# Patient Record
Sex: Female | Born: 1950 | Race: White | Hispanic: No | Smoking: Former smoker
Health system: Southern US, Community
[De-identification: ages and names within clinical notes are randomized; demographics above are authoritative.]

## PROBLEM LIST (undated history)

## (undated) DIAGNOSIS — M545 Low back pain, unspecified: Secondary | ICD-10-CM

## (undated) DIAGNOSIS — D3502 Benign neoplasm of left adrenal gland: Secondary | ICD-10-CM

## (undated) DIAGNOSIS — I519 Heart disease, unspecified: Secondary | ICD-10-CM

## (undated) DIAGNOSIS — J189 Pneumonia, unspecified organism: Secondary | ICD-10-CM

## (undated) DIAGNOSIS — F329 Major depressive disorder, single episode, unspecified: Secondary | ICD-10-CM

## (undated) DIAGNOSIS — M1990 Unspecified osteoarthritis, unspecified site: Secondary | ICD-10-CM

## (undated) DIAGNOSIS — R918 Other nonspecific abnormal finding of lung field: Secondary | ICD-10-CM

## (undated) DIAGNOSIS — I8500 Esophageal varices without bleeding: Secondary | ICD-10-CM

## (undated) DIAGNOSIS — E119 Type 2 diabetes mellitus without complications: Secondary | ICD-10-CM

## (undated) DIAGNOSIS — R0600 Dyspnea, unspecified: Secondary | ICD-10-CM

## (undated) DIAGNOSIS — F410 Panic disorder [episodic paroxysmal anxiety] without agoraphobia: Secondary | ICD-10-CM

## (undated) DIAGNOSIS — I10 Essential (primary) hypertension: Secondary | ICD-10-CM

## (undated) DIAGNOSIS — Z87442 Personal history of urinary calculi: Secondary | ICD-10-CM

## (undated) DIAGNOSIS — E785 Hyperlipidemia, unspecified: Secondary | ICD-10-CM

## (undated) DIAGNOSIS — J449 Chronic obstructive pulmonary disease, unspecified: Secondary | ICD-10-CM

## (undated) DIAGNOSIS — F419 Anxiety disorder, unspecified: Secondary | ICD-10-CM

## (undated) DIAGNOSIS — C4490 Unspecified malignant neoplasm of skin, unspecified: Secondary | ICD-10-CM

## (undated) DIAGNOSIS — I509 Heart failure, unspecified: Secondary | ICD-10-CM

## (undated) DIAGNOSIS — F32A Depression, unspecified: Secondary | ICD-10-CM

## (undated) DIAGNOSIS — I5189 Other ill-defined heart diseases: Secondary | ICD-10-CM

## (undated) HISTORY — DX: Major depressive disorder, single episode, unspecified: F32.9

## (undated) HISTORY — PX: SKIN CANCER EXCISION: SHX779

## (undated) HISTORY — DX: Chronic obstructive pulmonary disease, unspecified: J44.9

## (undated) HISTORY — DX: Type 2 diabetes mellitus without complications: E11.9

## (undated) HISTORY — PX: OTHER SURGICAL HISTORY: SHX169

## (undated) HISTORY — PX: SPINE SURGERY: SHX786

## (undated) HISTORY — DX: Depression, unspecified: F32.A

## (undated) HISTORY — DX: Hyperlipidemia, unspecified: E78.5

## (undated) HISTORY — DX: Essential (primary) hypertension: I10

## (undated) HISTORY — DX: Heart failure, unspecified: I50.9

## (undated) MED FILL — Iron Sucrose Inj 20 MG/ML (Fe Equiv): INTRAVENOUS | Qty: 15 | Status: AC

---
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
## 1997-07-10 ENCOUNTER — Encounter: Admission: RE | Admit: 1997-07-10 | Discharge: 1997-07-10 | Payer: Self-pay | Admitting: Family Medicine

## 1997-11-07 ENCOUNTER — Encounter: Admission: RE | Admit: 1997-11-07 | Discharge: 1997-11-07 | Payer: Self-pay | Admitting: Family Medicine

## 1997-11-11 ENCOUNTER — Encounter: Admission: RE | Admit: 1997-11-11 | Discharge: 1997-11-11 | Payer: Self-pay | Admitting: Sports Medicine

## 1997-11-28 ENCOUNTER — Encounter: Admission: RE | Admit: 1997-11-28 | Discharge: 1997-11-28 | Payer: Self-pay | Admitting: Family Medicine

## 2000-05-02 ENCOUNTER — Encounter: Admission: RE | Admit: 2000-05-02 | Discharge: 2000-05-02 | Payer: Self-pay | Admitting: Sports Medicine

## 2000-05-11 ENCOUNTER — Encounter: Admission: RE | Admit: 2000-05-11 | Discharge: 2000-05-11 | Payer: Self-pay | Admitting: Sports Medicine

## 2000-05-11 ENCOUNTER — Encounter: Payer: Self-pay | Admitting: Sports Medicine

## 2000-05-16 ENCOUNTER — Encounter: Payer: Self-pay | Admitting: Sports Medicine

## 2000-05-16 ENCOUNTER — Encounter: Admission: RE | Admit: 2000-05-16 | Discharge: 2000-05-16 | Payer: Self-pay | Admitting: Sports Medicine

## 2000-11-30 ENCOUNTER — Encounter: Admission: RE | Admit: 2000-11-30 | Discharge: 2000-11-30 | Payer: Self-pay | Admitting: Family Medicine

## 2001-03-14 ENCOUNTER — Encounter: Admission: RE | Admit: 2001-03-14 | Discharge: 2001-03-14 | Payer: Self-pay | Admitting: Family Medicine

## 2001-05-02 ENCOUNTER — Encounter: Payer: Self-pay | Admitting: Family Medicine

## 2001-05-02 ENCOUNTER — Encounter: Admission: RE | Admit: 2001-05-02 | Discharge: 2001-05-02 | Payer: Self-pay | Admitting: Family Medicine

## 2001-05-04 ENCOUNTER — Encounter: Admission: RE | Admit: 2001-05-04 | Discharge: 2001-05-04 | Payer: Self-pay | Admitting: Family Medicine

## 2001-05-11 ENCOUNTER — Encounter: Admission: RE | Admit: 2001-05-11 | Discharge: 2001-05-11 | Payer: Self-pay | Admitting: Family Medicine

## 2001-05-30 ENCOUNTER — Encounter: Admission: RE | Admit: 2001-05-30 | Discharge: 2001-07-02 | Payer: Self-pay | Admitting: Infectious Diseases

## 2001-07-09 ENCOUNTER — Encounter: Admission: RE | Admit: 2001-07-09 | Discharge: 2001-07-09 | Payer: Self-pay | Admitting: Family Medicine

## 2001-12-06 ENCOUNTER — Encounter: Admission: RE | Admit: 2001-12-06 | Discharge: 2001-12-06 | Payer: Self-pay | Admitting: Family Medicine

## 2002-02-07 ENCOUNTER — Encounter: Admission: RE | Admit: 2002-02-07 | Discharge: 2002-02-07 | Payer: Self-pay | Admitting: Family Medicine

## 2002-02-14 ENCOUNTER — Encounter: Admission: RE | Admit: 2002-02-14 | Discharge: 2002-02-14 | Payer: Self-pay | Admitting: Sports Medicine

## 2002-02-14 ENCOUNTER — Encounter: Payer: Self-pay | Admitting: Sports Medicine

## 2002-04-04 ENCOUNTER — Encounter: Admission: RE | Admit: 2002-04-04 | Discharge: 2002-04-04 | Payer: Self-pay | Admitting: Sports Medicine

## 2002-05-14 ENCOUNTER — Ambulatory Visit (HOSPITAL_BASED_OUTPATIENT_CLINIC_OR_DEPARTMENT_OTHER): Admission: RE | Admit: 2002-05-14 | Discharge: 2002-05-14 | Payer: Self-pay | Admitting: General Surgery

## 2002-05-28 ENCOUNTER — Encounter: Admission: RE | Admit: 2002-05-28 | Discharge: 2002-05-28 | Payer: Self-pay | Admitting: Family Medicine

## 2002-07-11 ENCOUNTER — Encounter: Admission: RE | Admit: 2002-07-11 | Discharge: 2002-07-11 | Payer: Self-pay | Admitting: Family Medicine

## 2002-07-18 ENCOUNTER — Encounter: Admission: RE | Admit: 2002-07-18 | Discharge: 2002-07-18 | Payer: Self-pay | Admitting: Family Medicine

## 2002-08-06 ENCOUNTER — Encounter: Admission: RE | Admit: 2002-08-06 | Discharge: 2002-08-06 | Payer: Self-pay | Admitting: Sports Medicine

## 2003-04-04 ENCOUNTER — Encounter: Admission: RE | Admit: 2003-04-04 | Discharge: 2003-04-04 | Payer: Self-pay | Admitting: Family Medicine

## 2003-04-11 ENCOUNTER — Encounter: Admission: RE | Admit: 2003-04-11 | Discharge: 2003-04-11 | Payer: Self-pay | Admitting: Family Medicine

## 2003-09-10 ENCOUNTER — Encounter: Admission: RE | Admit: 2003-09-10 | Discharge: 2003-09-10 | Payer: Self-pay | Admitting: Orthopaedic Surgery

## 2003-11-19 ENCOUNTER — Encounter: Admission: RE | Admit: 2003-11-19 | Discharge: 2003-11-19 | Payer: Self-pay | Admitting: Family Medicine

## 2003-12-03 ENCOUNTER — Inpatient Hospital Stay (HOSPITAL_COMMUNITY): Admission: RE | Admit: 2003-12-03 | Discharge: 2003-12-06 | Payer: Self-pay | Admitting: Orthopaedic Surgery

## 2004-07-26 ENCOUNTER — Ambulatory Visit: Payer: Self-pay | Admitting: Family Medicine

## 2005-03-28 ENCOUNTER — Encounter (INDEPENDENT_AMBULATORY_CARE_PROVIDER_SITE_OTHER): Payer: Self-pay | Admitting: *Deleted

## 2005-04-12 ENCOUNTER — Ambulatory Visit: Payer: Self-pay | Admitting: Family Medicine

## 2005-04-12 ENCOUNTER — Encounter: Admission: RE | Admit: 2005-04-12 | Discharge: 2005-04-12 | Payer: Self-pay | Admitting: Sports Medicine

## 2005-05-11 ENCOUNTER — Ambulatory Visit: Payer: Self-pay | Admitting: Family Medicine

## 2005-09-13 ENCOUNTER — Ambulatory Visit: Payer: Self-pay | Admitting: Sports Medicine

## 2006-01-03 ENCOUNTER — Ambulatory Visit: Payer: Self-pay | Admitting: Family Medicine

## 2006-04-12 ENCOUNTER — Ambulatory Visit: Payer: Self-pay | Admitting: Sports Medicine

## 2006-04-24 ENCOUNTER — Ambulatory Visit: Payer: Self-pay | Admitting: Family Medicine

## 2006-05-11 ENCOUNTER — Ambulatory Visit: Payer: Self-pay | Admitting: Sports Medicine

## 2006-05-25 DIAGNOSIS — E669 Obesity, unspecified: Secondary | ICD-10-CM | POA: Insufficient documentation

## 2006-05-25 DIAGNOSIS — F339 Major depressive disorder, recurrent, unspecified: Secondary | ICD-10-CM | POA: Insufficient documentation

## 2006-05-25 DIAGNOSIS — I10 Essential (primary) hypertension: Secondary | ICD-10-CM | POA: Insufficient documentation

## 2006-05-25 DIAGNOSIS — F17200 Nicotine dependence, unspecified, uncomplicated: Secondary | ICD-10-CM | POA: Insufficient documentation

## 2006-05-25 DIAGNOSIS — M1990 Unspecified osteoarthritis, unspecified site: Secondary | ICD-10-CM | POA: Insufficient documentation

## 2006-05-26 ENCOUNTER — Encounter (INDEPENDENT_AMBULATORY_CARE_PROVIDER_SITE_OTHER): Payer: Self-pay | Admitting: *Deleted

## 2006-10-13 ENCOUNTER — Ambulatory Visit: Payer: Self-pay | Admitting: Family Medicine

## 2007-02-14 ENCOUNTER — Ambulatory Visit: Payer: Self-pay | Admitting: Family Medicine

## 2007-02-14 DIAGNOSIS — J449 Chronic obstructive pulmonary disease, unspecified: Secondary | ICD-10-CM | POA: Insufficient documentation

## 2007-02-27 ENCOUNTER — Ambulatory Visit: Payer: Self-pay | Admitting: Family Medicine

## 2007-03-01 ENCOUNTER — Ambulatory Visit: Payer: Self-pay | Admitting: Family Medicine

## 2007-03-06 ENCOUNTER — Ambulatory Visit: Payer: Self-pay | Admitting: Family Medicine

## 2007-03-08 ENCOUNTER — Encounter: Payer: Self-pay | Admitting: Family Medicine

## 2007-03-13 ENCOUNTER — Encounter: Payer: Self-pay | Admitting: Family Medicine

## 2007-05-10 ENCOUNTER — Ambulatory Visit: Payer: Self-pay | Admitting: Family Medicine

## 2007-05-25 ENCOUNTER — Encounter: Payer: Self-pay | Admitting: *Deleted

## 2007-07-16 ENCOUNTER — Telehealth: Payer: Self-pay | Admitting: *Deleted

## 2007-07-17 ENCOUNTER — Ambulatory Visit: Payer: Self-pay | Admitting: Family Medicine

## 2007-07-17 DIAGNOSIS — R609 Edema, unspecified: Secondary | ICD-10-CM | POA: Insufficient documentation

## 2007-07-20 ENCOUNTER — Telehealth: Payer: Self-pay | Admitting: *Deleted

## 2007-07-23 ENCOUNTER — Encounter: Payer: Self-pay | Admitting: Family Medicine

## 2007-07-23 ENCOUNTER — Ambulatory Visit: Payer: Self-pay | Admitting: Family Medicine

## 2007-07-23 ENCOUNTER — Telehealth: Payer: Self-pay | Admitting: *Deleted

## 2007-07-23 LAB — CONVERTED CEMR LAB
ALT: 60 units/L — ABNORMAL HIGH (ref 0–35)
Alkaline Phosphatase: 117 units/L (ref 39–117)
Chloride: 98 meq/L (ref 96–112)
Creatinine, Ser: 0.75 mg/dL (ref 0.40–1.20)
Glucose, Bld: 125 mg/dL — ABNORMAL HIGH (ref 70–99)
MCV: 94.8 fL (ref 78.0–100.0)
Platelets: 187 10*3/uL (ref 150–400)
RDW: 14.2 % (ref 11.5–15.5)
Specific Gravity, Urine: 1.015
WBC: 8.2 10*3/uL (ref 4.0–10.5)

## 2007-07-25 ENCOUNTER — Encounter: Payer: Self-pay | Admitting: Family Medicine

## 2007-07-26 ENCOUNTER — Telehealth: Payer: Self-pay | Admitting: Family Medicine

## 2007-07-26 ENCOUNTER — Ambulatory Visit: Payer: Self-pay | Admitting: Internal Medicine

## 2007-07-26 ENCOUNTER — Encounter (INDEPENDENT_AMBULATORY_CARE_PROVIDER_SITE_OTHER): Payer: Self-pay | Admitting: Family Medicine

## 2007-07-26 ENCOUNTER — Ambulatory Visit (HOSPITAL_COMMUNITY): Admission: RE | Admit: 2007-07-26 | Discharge: 2007-07-26 | Payer: Self-pay | Admitting: Family Medicine

## 2007-07-31 ENCOUNTER — Ambulatory Visit: Payer: Self-pay | Admitting: Cardiovascular Disease

## 2007-07-31 LAB — CONVERTED CEMR LAB
Calcium: 9 mg/dL (ref 8.4–10.5)
Creatinine, Ser: 0.8 mg/dL (ref 0.4–1.2)
GFR calc non Af Amer: 79 mL/min
Glucose, Bld: 167 mg/dL — ABNORMAL HIGH (ref 70–99)
Potassium: 3 meq/L — ABNORMAL LOW (ref 3.5–5.1)
Sodium: 139 meq/L (ref 135–145)

## 2007-08-03 ENCOUNTER — Telehealth (INDEPENDENT_AMBULATORY_CARE_PROVIDER_SITE_OTHER): Payer: Self-pay | Admitting: Family Medicine

## 2007-08-06 ENCOUNTER — Ambulatory Visit: Payer: Self-pay | Admitting: Pulmonary Disease

## 2007-08-08 ENCOUNTER — Ambulatory Visit: Payer: Self-pay | Admitting: Internal Medicine

## 2007-08-13 ENCOUNTER — Encounter: Payer: Self-pay | Admitting: *Deleted

## 2007-08-29 ENCOUNTER — Ambulatory Visit: Payer: Self-pay | Admitting: Pulmonary Disease

## 2007-08-29 DIAGNOSIS — R911 Solitary pulmonary nodule: Secondary | ICD-10-CM | POA: Insufficient documentation

## 2007-09-17 ENCOUNTER — Encounter: Payer: Self-pay | Admitting: Family Medicine

## 2007-09-17 ENCOUNTER — Ambulatory Visit: Payer: Self-pay | Admitting: Sports Medicine

## 2007-09-17 LAB — CONVERTED CEMR LAB
AST: 65 units/L — ABNORMAL HIGH (ref 0–37)
Albumin: 3.6 g/dL (ref 3.5–5.2)
CO2: 30 meq/L (ref 19–32)
Calcium: 9 mg/dL (ref 8.4–10.5)
Chloride: 100 meq/L (ref 96–112)
Creatinine, Ser: 0.69 mg/dL (ref 0.40–1.20)
Glucose, Bld: 113 mg/dL — ABNORMAL HIGH (ref 70–99)
Potassium: 3.7 meq/L (ref 3.5–5.3)
Sodium: 141 meq/L (ref 135–145)
Total Bilirubin: 0.5 mg/dL (ref 0.3–1.2)
Total Protein: 7.7 g/dL (ref 6.0–8.3)

## 2007-09-24 ENCOUNTER — Ambulatory Visit: Payer: Self-pay | Admitting: Cardiovascular Disease

## 2007-09-24 LAB — CONVERTED CEMR LAB
GFR calc non Af Amer: 91 mL/min
Glucose, Bld: 110 mg/dL — ABNORMAL HIGH (ref 70–99)
Hgb A1c MFr Bld: 7.5 % — ABNORMAL HIGH (ref 4.6–6.0)
Potassium: 3.3 meq/L — ABNORMAL LOW (ref 3.5–5.1)

## 2007-10-01 ENCOUNTER — Telehealth: Payer: Self-pay | Admitting: *Deleted

## 2007-10-08 ENCOUNTER — Ambulatory Visit: Payer: Self-pay | Admitting: Family Medicine

## 2007-10-08 DIAGNOSIS — D239 Other benign neoplasm of skin, unspecified: Secondary | ICD-10-CM | POA: Insufficient documentation

## 2007-10-10 ENCOUNTER — Encounter: Payer: Self-pay | Admitting: Family Medicine

## 2007-10-15 ENCOUNTER — Telehealth: Payer: Self-pay | Admitting: *Deleted

## 2007-12-21 ENCOUNTER — Encounter: Payer: Self-pay | Admitting: Family Medicine

## 2008-01-04 ENCOUNTER — Ambulatory Visit: Payer: Self-pay | Admitting: Family Medicine

## 2008-01-16 ENCOUNTER — Encounter: Payer: Self-pay | Admitting: Family Medicine

## 2008-02-19 ENCOUNTER — Encounter: Payer: Self-pay | Admitting: Pulmonary Disease

## 2008-02-26 ENCOUNTER — Ambulatory Visit: Payer: Self-pay | Admitting: Cardiovascular Disease

## 2008-04-07 ENCOUNTER — Telehealth: Payer: Self-pay | Admitting: Family Medicine

## 2008-04-11 ENCOUNTER — Telehealth: Payer: Self-pay | Admitting: *Deleted

## 2008-04-11 ENCOUNTER — Encounter: Payer: Self-pay | Admitting: Family Medicine

## 2008-04-11 ENCOUNTER — Ambulatory Visit: Payer: Self-pay | Admitting: Family Medicine

## 2008-04-11 LAB — CONVERTED CEMR LAB
ALT: 58 units/L — ABNORMAL HIGH (ref 0–35)
AST: 68 units/L — ABNORMAL HIGH (ref 0–37)
Alkaline Phosphatase: 116 units/L (ref 39–117)
BUN: 15 mg/dL (ref 6–23)
Calcium: 9.2 mg/dL (ref 8.4–10.5)
Creatinine, Ser: 0.79 mg/dL (ref 0.40–1.20)
Glucose, Bld: 154 mg/dL — ABNORMAL HIGH (ref 70–99)
MCHC: 32.9 g/dL (ref 30.0–36.0)
Total Bilirubin: 0.4 mg/dL (ref 0.3–1.2)
WBC: 9.5 10*3/uL (ref 4.0–10.5)

## 2008-08-12 ENCOUNTER — Ambulatory Visit: Payer: Self-pay | Admitting: Family Medicine

## 2008-08-12 DIAGNOSIS — E119 Type 2 diabetes mellitus without complications: Secondary | ICD-10-CM

## 2008-08-12 HISTORY — DX: Type 2 diabetes mellitus without complications: E11.9

## 2008-08-15 ENCOUNTER — Telehealth: Payer: Self-pay | Admitting: Family Medicine

## 2008-08-18 ENCOUNTER — Ambulatory Visit: Payer: Self-pay | Admitting: Family Medicine

## 2008-08-18 ENCOUNTER — Encounter: Payer: Self-pay | Admitting: Family Medicine

## 2008-08-18 LAB — CONVERTED CEMR LAB
HDL: 37 mg/dL — ABNORMAL LOW (ref >39)
LDL Cholesterol: 125 mg/dL — ABNORMAL HIGH (ref 0–99)
Triglycerides: 88 mg/dL (ref <150)

## 2008-08-20 ENCOUNTER — Encounter: Payer: Self-pay | Admitting: Family Medicine

## 2008-08-28 ENCOUNTER — Ambulatory Visit: Payer: Self-pay | Admitting: Family Medicine

## 2008-08-29 ENCOUNTER — Telehealth: Payer: Self-pay | Admitting: *Deleted

## 2008-09-17 ENCOUNTER — Encounter: Payer: Self-pay | Admitting: Pulmonary Disease

## 2008-09-22 ENCOUNTER — Ambulatory Visit: Payer: Self-pay | Admitting: Cardiology

## 2008-09-24 ENCOUNTER — Telehealth: Payer: Self-pay | Admitting: Family Medicine

## 2008-09-28 ENCOUNTER — Emergency Department (HOSPITAL_COMMUNITY): Admission: EM | Admit: 2008-09-28 | Discharge: 2008-09-28 | Payer: Self-pay | Admitting: Emergency Medicine

## 2008-10-13 ENCOUNTER — Ambulatory Visit: Payer: Self-pay | Admitting: Family Medicine

## 2009-02-23 ENCOUNTER — Telehealth: Payer: Self-pay | Admitting: Family Medicine

## 2009-03-24 ENCOUNTER — Ambulatory Visit: Payer: Self-pay | Admitting: Family Medicine

## 2009-03-24 ENCOUNTER — Encounter: Payer: Self-pay | Admitting: Family Medicine

## 2009-03-24 DIAGNOSIS — E785 Hyperlipidemia, unspecified: Secondary | ICD-10-CM

## 2009-03-24 DIAGNOSIS — E1169 Type 2 diabetes mellitus with other specified complication: Secondary | ICD-10-CM | POA: Insufficient documentation

## 2009-03-25 LAB — CONVERTED CEMR LAB
ALT: 11 units/L (ref 0–35)
AST: 15 units/L (ref 0–37)
Albumin: 3.8 g/dL (ref 3.5–5.2)
Alkaline Phosphatase: 110 units/L (ref 39–117)
BUN: 21 mg/dL (ref 6–23)
CO2: 26 meq/L (ref 19–32)
Calcium: 9.2 mg/dL (ref 8.4–10.5)
Chloride: 104 meq/L (ref 96–112)
Cholesterol: 201 mg/dL — ABNORMAL HIGH (ref 0–200)
Creatinine, Ser: 0.64 mg/dL (ref 0.40–1.20)
HDL: 42 mg/dL (ref >39)
Potassium: 3.9 meq/L (ref 3.5–5.3)
Sodium: 140 meq/L (ref 135–145)
Total Bilirubin: 0.3 mg/dL (ref 0.3–1.2)
Total Protein: 7.4 g/dL (ref 6.0–8.3)
Triglycerides: 98 mg/dL (ref <150)
VLDL: 20 mg/dL (ref 0–40)

## 2009-04-07 ENCOUNTER — Encounter: Payer: Self-pay | Admitting: Family Medicine

## 2009-04-07 ENCOUNTER — Ambulatory Visit: Payer: Self-pay | Admitting: Family Medicine

## 2009-04-07 DIAGNOSIS — M722 Plantar fascial fibromatosis: Secondary | ICD-10-CM | POA: Insufficient documentation

## 2009-04-08 LAB — CONVERTED CEMR LAB: Creatinine, Ser: 0.6 mg/dL (ref 0.40–1.20)

## 2009-06-11 ENCOUNTER — Ambulatory Visit: Payer: Self-pay | Admitting: Family Medicine

## 2009-06-11 ENCOUNTER — Encounter: Payer: Self-pay | Admitting: Family Medicine

## 2009-06-12 LAB — CONVERTED CEMR LAB
ALT: 12 units/L (ref 0–35)
Alkaline Phosphatase: 113 units/L (ref 39–117)
Calcium: 8.6 mg/dL (ref 8.4–10.5)
Total Bilirubin: 0.4 mg/dL (ref 0.3–1.2)

## 2009-06-16 ENCOUNTER — Ambulatory Visit: Payer: Self-pay | Admitting: Family Medicine

## 2009-08-07 ENCOUNTER — Encounter: Payer: Self-pay | Admitting: Pulmonary Disease

## 2009-08-11 ENCOUNTER — Ambulatory Visit: Payer: Self-pay | Admitting: Family Medicine

## 2009-08-12 ENCOUNTER — Ambulatory Visit: Payer: Self-pay | Admitting: Cardiology

## 2009-08-28 ENCOUNTER — Telehealth: Payer: Self-pay | Admitting: Family Medicine

## 2009-10-09 ENCOUNTER — Telehealth: Payer: Self-pay | Admitting: Family Medicine

## 2009-10-09 ENCOUNTER — Encounter: Admission: RE | Admit: 2009-10-09 | Discharge: 2009-10-09 | Payer: Self-pay | Admitting: Family Medicine

## 2009-10-09 ENCOUNTER — Ambulatory Visit: Payer: Self-pay | Admitting: Family Medicine

## 2009-10-09 DIAGNOSIS — S93409A Sprain of unspecified ligament of unspecified ankle, initial encounter: Secondary | ICD-10-CM | POA: Insufficient documentation

## 2010-02-08 ENCOUNTER — Ambulatory Visit: Payer: Self-pay | Admitting: Family Medicine

## 2010-02-08 ENCOUNTER — Telehealth: Payer: Self-pay | Admitting: *Deleted

## 2010-04-27 NOTE — Progress Notes (Signed)
Summary: pt thinks she has pn   Phone Note Call from Patient   Caller: Patient Call For: triage Summary of Call: pt thinks that she has pneumonia has had signs symptoms since friday she is out of breath Initial call taken by: Audelia Hives CMA,  February 08, 2010 8:33 AM  Follow-up for Phone Call        Cough abd SOB over the weekend.  Cough productive and blood tinged.  She can be here in about 30 minutes, gave her a [place] appt. Follow-up by: Elray Mcgregor RN,  February 08, 2010 8:53 AM

## 2010-04-27 NOTE — Assessment & Plan Note (Signed)
Summary: foot pain,df   Vital Signs:  Patient profile:   60 year old female Height:      62 inches Weight:      235.7 pounds BMI:     43.27 Temp:     98.0 degrees F oral Pulse rate:   73 / minute BP sitting:   138 / 78  (left arm) Cuff size:   regular  Vitals Entered By: Levert Feinstein LPN (June 11, 3792 3:27 AM) CC: right foot pain, Is Patient Diabetic? No Pain Assessment Patient in pain? yes     Location: right foot Intensity: 8   Primary Care Provider:  [name]  MD  CC:  right foot pain and .  History of Present Illness: 1) Right heel pain: Seen by me in January 2011 for right heel pain x 7 weeks. Pain located sole of foot, anterior to calcaneus. Worse with weight bearing, direct palpation, though improves somewhat with walking. Somewhat relieved by ibuprofen. Diagnosed with plantar fasciitis - trial of NSAID, RICE therapy, exercises, but no improvement in overall pain. Denies swelling, skin change, loss of sensation, similar pain elsewhere.   2) HTN: On Lasix, lisinopril w/o side effects. Denies chest pain, dyspnea. Reports improvement in LE edema.   3) Hyperlipidemia: Started statin last visit. LDL 139 (goal 70), HDL 42. Continued dietary modification with increased fruits and vegetables, decreased fried food.  6) Tobacco abuse: Actively cutting back per our plan. Now down to 5 per day. Had quit about a decade ago but restarted with death of husband.   7) Prevention: Declines colonoscopy, mammogram. Pap wnl. Flu shot at last visit.    Habits & Providers  Alcohol-Tobacco-Diet     Tobacco Status: current  Current Medications (verified): 1)  Celexa 40 Mg Tabs (Citalopram Hydrobromide) .... Take 1 Tablet By Mouth Once A Day 2)  Atrovent Hfa 17 Mcg/act  Aers (Ipratropium Bromide Hfa) .... Inhale 2 Puffs Every 4 To 6 Hours As Needed 3)  Metformin Hcl 500 Mg Tabs (Metformin Hcl) .Marland Kitchen.. 1 Tab By Mouth Bid 4)  Glucometer Dex High Control  Liqd (Blood Glucose Calibration)  .... Test Cbgs Once Daily. 5)  Glucometer Dex Test Sensors  Strp (Glucose Blood) .... Use As Directed. 6)  Lisinopril 10 Mg Tabs (Lisinopril) .... One Tab By Mouth Qday 7)  Lovastatin 20 Mg Tabs (Lovastatin) .... One Tab By Mouth Qday 8)  Lasix 20 Mg Tabs (Furosemide) .... Two Tabs By Mouth Daily  Allergies (verified): No Known Drug Allergies  Physical Exam  General:  Obese, pleasant female in no acute distress Lungs:  Normal respiratory effort, chest expands symmetrically. Lungs are clear to auscultation, no crackles or wheezes. Heart:  regular rate and rhythm, no murmurs  Msk:  Pain at insertion of plantar fascia of right foot with palpation. Patient with some loss of bilateral longitudinal arches  Neurologic:  alert & oriented X3 and cranial nerves II-XII intact.    Diabetes Management Exam:    Foot Exam (with socks and/or shoes not present):       Sensory-Pinprick/Light touch:          Left medial foot (L-4): normal          Left dorsal foot (L-5): normal          Left lateral foot (S-1): normal          Right medial foot (L-4): normal          Right dorsal foot (L-5): normal  Right lateral foot (S-1): normal       Sensory-Monofilament:          Left foot: normal          Right foot: normal       Inspection:          Left foot: normal          Right foot: normal       Nails:          Left foot: normal          Right foot: normal   Impression & Recommendations:  Problem # 1:  PLANTAR FASCIITIS, RIGHT (ICD-728.71) Assessment Unchanged  Will refer to sports medicine for further evaluation likely w/ ultrasound. Continue RICE therapy; will switch to Tylenol given HTN.   Orders: [place]- Est  Level 4 ([address]62)  Problem # 2:  HYPERTENSION, BENIGN SYSTEMIC (ICD-401.1) Assessment: Unchanged  CMET today. Follow up in 1 month. Continue meds as below. Advised salt restriction, increase activity.  Her updated medication list for this problem includes:    Lisinopril 10 Mg  Tabs (Lisinopril) ..... One tab by mouth qday    Lasix 20 Mg Tabs (Furosemide) .Marland Kitchen..Marland Kitchen Two tabs by mouth daily  BP today: 138/78 Prior BP: 154/82 (04/07/2009)  10 Yr Risk Heart Disease: 27 % Prior 10 Yr Risk Heart Disease: Not enough information (01/04/2008)  Labs Reviewed: K+: 4.0 (04/07/2009) Creat: : 0.60 (04/07/2009)   Chol: 201 (03/24/2009)   HDL: 42 (03/24/2009)   LDL: 139 (03/24/2009)   TG: 98 (03/24/2009)  Orders: [place]- Est  Level 4 ([address]214)  Problem # 3:  HYPERLIPIDEMIA (ICD-272.4) Assessment: Unchanged Continue statin as below. Recheck in one month. CMET today. Diet and exercise reviewed.  Her updated medication list for this problem includes:    Lovastatin 20 Mg Tabs (Lovastatin) ..... One tab by mouth qday  Orders: [facility] ([id number]) [place]- Est  Level 4 ([address]009)  Complete Medication List: 1)  Celexa 40 Mg Tabs (Citalopram hydrobromide) .... Take 1 tablet by mouth once a day 2)  Atrovent Hfa 17 Mcg/act Aers (Ipratropium bromide hfa) .... Inhale 2 puffs every 4 to 6 hours as needed 3)  Metformin Hcl 500 Mg Tabs (Metformin hcl) .Marland Kitchen.. 1 tab by mouth bid 4)  Glucometer Dex High Control Liqd (Blood glucose calibration) .... Test cbgs once daily. 5)  Glucometer Dex Test Sensors Strp (Glucose blood) .... Use as directed. 6)  Lisinopril 10 Mg Tabs (Lisinopril) .... One tab by mouth qday 7)  Lovastatin 20 Mg Tabs (Lovastatin) .... One tab by mouth qday 8)  Lasix 20 Mg Tabs (Furosemide) .... Two tabs by mouth daily  Patient Instructions: 1)  Go to Sports Medicine for your foot pain. 2)  Stop taking ibuprofen, start taking Tylenol for pain.  3)  Continue to do your exercises for your foot as tolerated  4)  We will check blood work today. 5)  Follow up in one month to discuss blood pressure and diabetes.  6)  Continue all other medicines as before.   Flex Sig Next Due:  Refused Colonoscopy Next Due:  Refused Last Hemoccult Result: Done. (05/27/2002 12:00:00 AM) Hemoccult  Next Due:  Refused Last Mammogram:  Done. (01/26/2002 12:00:00 AM) Mammogram Next Due:  Refused Last Diabetes Foot Check:  yes (03/24/2009 10:37:39 AM) Diabetic Foot Exam Date:  06/11/2009 Diabetes Foot Check Result:  normal Foot Care Education Date:  06/11/2009 Foot Care Education:  completed Last Creatinine:  0.60 (04/07/2009 7:32:00 PM) Creatinine  Next Due: 6 mo Last Potassium:  4.0 (04/07/2009 7:32:00 PM) Potassium Next Due:  6 mo    Prevention & Chronic Care Immunizations   Influenza vaccine: Fluvax 3+  (03/24/2009)   Influenza vaccine due: 11/26/2009    Tetanus booster: 05/27/2002: Done.   Tetanus booster due: 05/26/2012    Pneumococcal vaccine: Not documented  Colorectal Screening   Hemoccult: Done.  (05/27/2002)   Hemoccult action/deferral: Refused  (03/24/2009)   Hemoccult due: Refused  (06/11/2009)    Colonoscopy: Not documented   Colonoscopy action/deferral: Refused  (03/24/2009)   Colonoscopy due: Refused  (06/11/2009)  Other Screening   Pap smear: NEGATIVE FOR INTRAEPITHELIAL LESIONS OR MALIGNANCY.  (03/24/2009)   Pap smear action/deferral: Ordered  (03/24/2009)   Pap smear due: 03/24/2010    Mammogram: Done.  (01/26/2002)   Mammogram action/deferral: Refused  (03/24/2009)   Mammogram due: Refused  (06/11/2009)   Smoking status: current  (06/11/2009)   Smoking cessation counseling: yes  (03/01/2007)   Target quit date: 04/24/2009  (03/24/2009)  Diabetes Mellitus   HgbA1C: 5.9  (03/24/2009)   HgbA1C action/deferral: Ordered  (03/24/2009)   Hemoglobin A1C due: 06/22/2009    Eye exam: Not documented   Diabetic eye exam action/deferral: Deferred  (03/24/2009)    Foot exam: normal  (06/11/2009)   Foot exam action/deferral: Do today   High risk foot: Not documented   Foot care education: completed  (06/11/2009)   Foot exam due: 06/12/2010    Urine microalbumin/creatinine ratio: Not documented   Urine microalbumin action/deferral: Deferred     Diabetes flowsheet reviewed?: Yes   Progress toward A1C goal: At goal  Lipids   Total Cholesterol: 201  (03/24/2009)   Lipid panel action/deferral: Lipid Panel ordered   LDL: 139  (03/24/2009)   LDL Direct: Not documented   HDL: 42  (03/24/2009)   Triglycerides: 98  (03/24/2009)   Lipid panel due: 06/22/2009    SGOT (AST): 15  (03/24/2009)   BMP action: Ordered   SGPT (ALT): 11  (03/24/2009) CMP ordered    Alkaline phosphatase: 110  (03/24/2009)   Total bilirubin: 0.3  (03/24/2009)   Liver panel due: 09/22/2009    Lipid flowsheet reviewed?: Yes   Progress toward LDL goal: Unchanged  Hypertension   Last Blood Pressure: 138 / 78  (06/11/2009)   Serum creatinine: 0.60  (04/07/2009)   BMP action: Ordered   Serum potassium 4.0  (04/07/2009) CMP ordered    Basic metabolic panel due: 02/40/9735    Hypertension flowsheet reviewed?: Yes   Progress toward BP goal: Improved  Self-Management Support :   Personal Goals (by the next clinic visit) :     Personal A1C goal: 7  (03/24/2009)     Personal blood pressure goal: 130/80  (03/24/2009)     Personal LDL goal: 70  (03/24/2009)    Patient will work on the following items until the next clinic visit to reach self-care goals:     Medications and monitoring: take my medicines every day, check my blood sugar, check my blood pressure, bring all of my medications to every visit  (06/11/2009)     Eating: drink diet soda or water instead of juice or soda, eat more vegetables, eat foods that are low in salt, eat baked foods instead of fried foods, eat fruit for snacks and desserts  (06/11/2009)     Activity: take a 30 minute walk every day  (06/11/2009)     Home glucose monitoring frequency: 1 time daily  (03/24/2009)  Diabetes self-management support: Not documented    Diabetes self-management support not done because: Good outcomes  (03/24/2009)    Hypertension self-management support: BP self-monitoring log, Written self-care plan,  Education handout  (04/07/2009)    Hypertension self-management support not done because: Good outcomes  (06/11/2009)    Lipid self-management support: Written self-care plan, Education handout  (06/11/2009)   Lipid self-care plan printed.   Lipid education handout printed

## 2010-04-27 NOTE — Assessment & Plan Note (Signed)
Summary: f/u visit/bmc   Vital Signs:  Patient profile:   60 year old female Height:      62 inches Weight:      239 pounds BMI:     43.87 BSA:     2.06 Temp:     98.3 degrees F Pulse rate:   77 / minute BP sitting:   154 / 82  Vitals Entered By: Christen Bame CMA (April 07, 2009 2:31 PM) CC: f/u HTN  Is Patient Diabetic? Yes Pain Assessment Patient in pain? yes     Location: right heel Intensity: 6   Primary Care Provider:  [name]  MD  CC:  f/u HTN .  History of Present Illness: 1) HTN: Stopped her Lasix, HCTZ at last visit per our discussion. Has noted more LE edema, feels like retaining fluid. Denies chest pain, dyspnea, HA, neurological symptoms. Started on ACE-I - denies side effects.   2) DM2: Last A1C 6.9. Not on ACE-I. On metformin. No further episodes of hypoglycemia. Denies vision change, polyuria, polydipsia. Has not called ophthalmologist yet.   3) Right heel pain: Has had right heel pain x 7 weeks. Pain is at sole of foot at front of heel when weight bearing, but improves with walking. Intermittent. Denies swelling, skin change,  loss of sensation, similar pain elsewhere. Has not improved. Notes that she tends to sit at her computer where she works with her toes in forced dorsiflexion.   4) Obesity: Has lost a total of 50 lbs through dietary modification, met with Dr. Jenne Campus.  (increased vegetables, whole grains; also increased activity (was walking up to 5 miles a day prior to back surgery - now walking 1/2 mile per day - working back up). Has regained 4 lbs since last visit (feels like fluid pr patient)   5) Hyperlipidemia: HDL 42, LDL 137  ~ 6 months ago. Has lost weight as above, increased activity, reduced fried foods to zero, increased fruits and vegetables.   6) Tobacco abuse: Actively cutting back per our plan. Now down to 5 per day. Had quit about a decade ago but restarted with death of husband.   7) Prevention: Declines colonoscopy, mammogram.  Pap wnl. Flu shot at last visit.   Habits & Providers  Alcohol-Tobacco-Diet     Tobacco Status: current     Tobacco Counseling: to quit use of tobacco products     Cigarette Packs/Day: 0.25  Current Medications (verified): 1)  Celexa 40 Mg Tabs (Citalopram Hydrobromide) .... Take 1 Tablet By Mouth Once A Day 2)  Atrovent Hfa 17 Mcg/act  Aers (Ipratropium Bromide Hfa) .... Inhale 2 Puffs Every 4 To 6 Hours As Needed 3)  Metformin Hcl 500 Mg Tabs (Metformin Hcl) .Marland Kitchen.. 1 Tab By Mouth Bid 4)  Glucometer Dex High Control  Liqd (Blood Glucose Calibration) .... Test Cbgs Once Daily. 5)  Glucometer Dex Test Sensors  Strp (Glucose Blood) .... Use As Directed. 6)  Lisinopril 10 Mg Tabs (Lisinopril) .... One Tab By Mouth Qday 7)  Lovastatin 20 Mg Tabs (Lovastatin) .... One Tab By Mouth Qday 8)  Lasix 20 Mg Tabs (Furosemide) .... Two Tabs By Mouth Daily  Allergies (verified): No Known Drug Allergies  Social History: Packs/Day:  0.25  Physical Exam  General:  Obese, pleasant female in no acute distress Eyes:  vision grossly intact, pupils equal, pupils round, pupils reactive to light, pupils react to accomodation, corneas and lenses clear, no injection, no optic disk abnormalities, no retinal abnormalitiies, and normal  cup-disc ratio.   Lungs:  Normal respiratory effort, chest expands symmetrically. Lungs are clear to auscultation, no crackles or wheezes. Heart:  regular rate and rhythm, no murmurs  Msk:  Pain at insertion of plantar fascia of right foot with palpation (with toes dorsiflexed)  Pulses:  2+ radials and pedals  Extremities:  1+ LE edema  Neurologic:  alert & oriented X3 and cranial nerves II-XII intact.     Impression & Recommendations:  Problem # 1:  HYPERTENSION, BENIGN SYSTEMIC (ICD-401.1) Assessment Deteriorated  Likely secondary to stopping LAsix as was controlled before. Restart Lasix. Check Cr today (started ACE-I). Follow up in 3 months. Continue ACE-I  Her updated  medication list for this problem includes:    Lisinopril 10 Mg Tabs (Lisinopril) ..... One tab by mouth qday    Lasix 20 Mg Tabs (Furosemide) .Marland Kitchen..Marland Kitchen Two tabs by mouth daily  Orders: [facility] ([id number]) [facility]- Est  Level 4 (99214)  BP today: 154/82 Prior BP: 130/77 (03/24/2009)  Prior 10 Yr Risk Heart Disease: Not enough information (01/04/2008)  Labs Reviewed: K+: 3.9 (03/24/2009) Creat: : 0.64 (03/24/2009)   Chol: 201 (03/24/2009)   HDL: 42 (03/24/2009)   LDL: 139 (03/24/2009)   TG: 98 (03/24/2009)  Problem # 2:  TOBACCO DEPENDENCE (ICD-305.1) Assessment: Unchanged  On schedule per instructions. Continues to actively cut back.    Orders: [place]- Est  Level 4 (86754)  Problem # 3:  HYPERLIPIDEMIA (GBE-010.4) Assessment: Unchanged  Will start statin. Recheck LFTs 6 weeks.  Her updated medication list for this problem includes:    Lovastatin 20 Mg Tabs (Lovastatin) ..... One tab by mouth qday  Orders: [place]- Est  Level 4 ([address]121)  Problem # 4:  OBESITY, NOS (ICD-278.00) Assessment: Deteriorated Increased weight likely secondary to fluid retetntion. Restart LAsix. Continue exercise and dietary modification.   Problem # 5:  PLANTAR FASCIITIS, RIGHT (ICD-728.71) Assessment: Unchanged  Likely given history, presentation and exam. Given exercises. RICE therapy. Follow up in three months. Tylenol for pain as needed .   Orders: [place]- Est  Level 4 ([address]214)  Complete Medication List: 1)  Celexa 40 Mg Tabs (Citalopram hydrobromide) .... Take 1 tablet by mouth once a day 2)  Atrovent Hfa 17 Mcg/act Aers (Ipratropium bromide hfa) .... Inhale 2 puffs every 4 to 6 hours as needed 3)  Metformin Hcl 500 Mg Tabs (Metformin hcl) .Marland Kitchen.. 1 tab by mouth bid 4)  Glucometer Dex High Control Liqd (Blood glucose calibration) .... Test cbgs once daily. 5)  Glucometer Dex Test Sensors Strp (Glucose blood) .... Use as directed. 6)  Lisinopril 10 Mg Tabs (Lisinopril) .... One tab by mouth qday 7)   Lovastatin 20 Mg Tabs (Lovastatin) .... One tab by mouth qday 8)  Lasix 20 Mg Tabs (Furosemide) .... Two tabs by mouth daily  Patient Instructions: 1)  It was great to see you today!  2)  Restart your LASIX  3)  START taking LOVASTATIN daily  4)  Stop taking your Klor-con (potassium pill) 5)  Stop HCTZ 6)  Continue all other medications.  7)  Follow up in 3  months for appointment. 8)  Come back in 6 weeks for lab check.  9)  Cut back on cigarettes by one every 3 days until you have quit completely.  10)  Keep up the good work with the exercise and dietary changes you have made. 11)  I will let you know about your lab results at your follow up visit.  12)  Do  the exercises as we discussed for your heel pain. Read the other instructions on how to treat your pain as well.  13)  You can take over the counter tylenol (acetaminophen) for your heel pain.  14)  Make an appointment with your ophthalmologist - for diabetic eye exam.  Prescriptions: LASIX 20 MG TABS (FUROSEMIDE) Two tabs by mouth daily  #60 x 3   Entered and Authorized by:   [name]  MD   Signed by:   [name]  MD on 04/07/2009   Method used:   Electronically to        [facility]  [phone]* (retail)       [address]       [place], [place]  [address]       Ph: [phone] or [phone]       Fax: [phone]   RxID:   [id number] LOVASTATIN 20 MG TABS (LOVASTATIN) one tab by mouth qday  #90 x 3   Entered and Authorized by:   [name]  MD   Signed by:   [name]  MD on 04/07/2009   Method used:   Electronically to        [facility]  [phone]* (retail)       [address]       [place],   [address]       Ph: [phone] or [phone]       Fax: [phone]   RxID:   [id number]     Prevention & Chronic Care Immunizations   Influenza vaccine: Fluvax 3+  (03/24/2009)   Influenza vaccine due: 11/26/2009     Tetanus booster: 05/27/2002: Done.   Tetanus booster due: 05/26/2012    Pneumococcal vaccine: Not documented  Colorectal Screening   Hemoccult: Done.  (05/27/2002)   Hemoccult action/deferral: Refused  (03/24/2009)   Hemoccult due: Refused  (08/28/2008)    Colonoscopy: Not documented   Colonoscopy action/deferral: Refused  (03/24/2009)   Colonoscopy due: Refused  (08/28/2008)  Other Screening   Pap smear: NEGATIVE FOR INTRAEPITHELIAL LESIONS OR MALIGNANCY.  (03/24/2009)   Pap smear action/deferral: Ordered  (03/24/2009)   Pap smear due: 03/24/2010    Mammogram: Done.  (01/26/2002)   Mammogram action/deferral: Refused  (03/24/2009)   Mammogram due: Refused  (08/28/2008)   Smoking status: current  (04/07/2009)   Smoking cessation counseling: yes  (03/01/2007)   Target quit date: 04/24/2009  (03/24/2009)  Diabetes Mellitus   HgbA1C: 5.9  (03/24/2009)   HgbA1C action/deferral: Ordered  (03/24/2009)   Hemoglobin A1C due: 06/22/2009    Eye exam: Not documented   Diabetic eye exam action/deferral: Deferred  (03/24/2009)    Foot exam: yes  (03/24/2009)   Foot exam action/deferral: Do today   High risk foot: Not documented   Foot care education: Not documented   Foot exam due: 06/22/2009    Urine microalbumin/creatinine ratio: Not documented   Urine microalbumin action/deferral: Deferred    Diabetes flowsheet reviewed?: Yes   Progress toward A1C goal: At goal  Lipids   Total Cholesterol: 201  (03/24/2009)   Lipid panel action/deferral: Lipid Panel ordered   LDL: 139  (03/24/2009)   LDL Direct: Not documented   HDL: 42  (03/24/2009)   Triglycerides: 98  (03/24/2009)   Lipid panel due: 10/05/2009    SGOT (AST): 15  (03/24/2009)   BMP action: Ordered   SGPT (ALT):  11  (03/24/2009)   Alkaline phosphatase: 110  (03/24/2009)   Total bilirubin: 0.3  (03/24/2009)   Liver panel due: 09/22/2009    Lipid flowsheet reviewed?: Yes   Progress toward LDL goal:  Unchanged  Hypertension   Last Blood Pressure: 154 / 82  (04/07/2009)   Serum creatinine: 0.64  (03/24/2009)   BMP action: Ordered   Serum potassium 3.9  (43/88/8757)   Basic metabolic panel due: 97/28/2060    Hypertension flowsheet reviewed?: Yes   Progress toward BP goal: Deteriorated  Self-Management Support :   Personal Goals (by the next clinic visit) :     Personal A1C goal: 7  (03/24/2009)     Personal blood pressure goal: 130/80  (03/24/2009)     Personal LDL goal: 70  (03/24/2009)    Patient will work on the following items until the next clinic visit to reach self-care goals:     Medications and monitoring: take my medicines every day, bring all of my medications to every visit, weigh myself weekly, examine my feet every day  (04/07/2009)     Eating: drink diet soda or water instead of juice or soda, eat more vegetables, use fresh or frozen vegetables, eat foods that are low in salt, eat baked foods instead of fried foods, eat fruit for snacks and desserts, limit or avoid alcohol  (04/07/2009)     Activity: take a 30 minute walk every day  (04/07/2009)     Home glucose monitoring frequency: 1 time daily  (03/24/2009)    Diabetes self-management support: Not documented    Diabetes self-management support not done because: Good outcomes  (03/24/2009)    Hypertension self-management support: BP self-monitoring log, Written self-care plan, Education handout  (04/07/2009)   Hypertension self-care plan printed.   Hypertension education handout printed    Hypertension self-management support not done because: Good outcomes  (03/24/2009)    Lipid self-management support: Lipid monitoring log, Written self-care plan  (04/07/2009)   Lipid self-care plan printed.   Nursing Instructions: Diabetic foot exam today

## 2010-04-27 NOTE — Assessment & Plan Note (Signed)
Summary: F/U,MC   Vital Signs:  Patient profile:   60 year old female Height:      62 inches Weight:      235.7 pounds Resp:     18 per minute  Vitals Entered By: Owens Loffler MD (Aug 12, 2009 5:07 PM)  History of Present Illness: 60 year old with ? PF:  PF - approx 25 % better, compliant with HEP But she is still limping.   Continual and feels like a hot poker is sitting on her foot. Has been ongoing now for about 10 weeks. Thinks but it may be a - weird position with siting at he computer.    This is notable for worsening pain first thing in the morning when arising and standing after sitting.   Prior foot or ankle fractures: none Prior operations: none Orthotics or bracing: OTC hapad and heel cups Medications: tylenol / nsaids PT or home rehab: some HEP Night splints: yes Ice massage: no Ball massage: yes  Metatarsal pain: no   Allergies: No Known Drug Allergies  Past History:  Past medical, surgical, family and social histories (including risk factors) reviewed, and no changes noted (except as noted below).  Past Medical History: Reviewed history from 04/11/2008 and no changes required. 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 -Back surgery (?fusion) - 11/27/2003 - Excision of L breast cyst - 04/28/2002  Family History: Reviewed history from 07/23/2007 and no changes required. Father--CAD, CABG x 4, Maternal GM--colon CA (died at 10) Mother: passed away--severe anemia "bone marrow suppression"  Social History: Reviewed history from 07/23/2007 and no changes required. Widowed--husband committed suicide in 2000; Lives with  daughter; also has a another yo  daughter; Smokes 1 PPD x 10 years (had smoked for 10-15 years, then quit for 7 years, but restarted when husband died); Denies alcohol, other drugs; Employed as Consulting civil engineer; previous school bus driver;Was doing walking for exercise but not now--before back surgery was walking 4-5 miles daily  Review of Systems       REVIEW OF SYSTEMS  GEN: No systemic complaints, no fevers, chills, sweats, or other acute illnesses MSK: Detailed in the HPI GI: tolerating PO intake without difficulty Neuro: No numbness, parasthesias, or tingling associated. Otherwise the pertinent positives of the ROS are noted above.    Physical Exam  General:  GEN: Well-developed,well-nourished,in no acute distress; alert,appropriate and cooperative throughout examination HEENT: Normocephalic and atraumatic without obvious abnormalities. No apparent alopecia or balding. Ears, externally no deformities PULM: Breathing comfortably in no respiratory distress EXT: No clubbing, cyanosis, or edema PSYCH: Normally interactive. Cooperative during the interview. Pleasant. Friendly and conversant. Not anxious or depressed appearing. Normal, full affect.  Msk:  R foot Echymosis: no Edema: no ROM: full LE B Gait: antalgic gait MT pain: no Callus pattern: none Lateral Mall: NT Medial Mall: NT Talus: NT Navicular: NT Calcaneous: NT Metatarsals: NT 5th MT: NT Phalanges: NT Achilles: NT Plantar Fascia: tender, medial along PF. Pain with forced dorsi Fat Pad: ttp Peroneals: NT Post Tib: NT Great Toe: Nml motion Ant Drawer: neg Sensation: intact    Impression & Recommendations:  Problem # 1:  PLANTAR FASCIITIS, RIGHT (ICD-728.71) Assessment Improved  Patient was fitted for a standard, cushioned,  semi-rigid orthotic.  The orthotic was heated and the patient stood on the orthotic blank positioned on the orthotic stand. The patient was positioned in subtalar neutral position and 10 degrees of ankle dorsiflexion in  a weight bearing stance. After molding, a stable Fast-Tech EVA base was applied to the orthotic blank.   The blank was ground to a stable position for weight bearing. size:6, fast-tech base: Blue EVA posting: none additional orthotic padding: none   45 mins prep time  Orders: [employer], each unit (L3002)  Complete Medication List: 1)  Celexa 40 Mg Tabs (Citalopram hydrobromide) .... Take 1 tablet by mouth once a day 2)  Atrovent Hfa 17 Mcg/act Aers (Ipratropium bromide hfa) .... Inhale 2 puffs every 4 to 6 hours as needed 3)  Metformin Hcl 500 Mg Tabs (Metformin hcl) .Marland Kitchen.. 1 tab by mouth bid 4)  Glucometer Dex High Control Liqd (Blood glucose calibration) .... Test cbgs once daily. 5)  Glucometer Dex Test Sensors Strp (Glucose blood) .... Use as directed. 6)  Lisinopril 10 Mg Tabs (Lisinopril) .... One tab by mouth qday 7)  Lovastatin 20 Mg Tabs (Lovastatin) .... One tab by mouth qday 8)  Lasix 20 Mg Tabs (Furosemide) .... Two tabs by mouth daily 9)  Plantar Fascitis Night Splint  .... Wear each night, icd9 = 728.71

## 2010-04-27 NOTE — Progress Notes (Signed)
Summary: wi request   Phone Note Call from Patient Call back at Home Phone [phone]   Reason for Call: Talk to Nurse Summary of Call: wi request, thinks she may have broken a bone in her foot Initial call taken by: Samara Snide,  October 09, 2009 8:59 AM  Follow-up for Phone Call        fell in a hole yesterday.  foot is swollen & painful. can weight bear but it hurts. asked her to come in now to see pcp Follow-up by: Elige Radon RN,  October 09, 2009 9:09 AM

## 2010-04-27 NOTE — Miscellaneous (Signed)
Summary: Orders Update   Clinical Lists Changes  Orders: Added new Referral order of Radiology Referral (Radiology) - Signed 

## 2010-04-27 NOTE — Progress Notes (Signed)
Summary: triage   Phone Note Call from Patient Call back at Home Phone [phone]   Caller: Patient Summary of Call: Pt feeling like she has to urinate all the time and hurting.  Has no way to get here.  What can she do on her own to help with this? Initial call taken by: Raymond Gurney,  August 28, 2009 3:19 PM  Follow-up for Phone Call        started today. has been drinking cranapple juice. has no ride until tonight when her dtr gets off. told her she needed to see a doctor. she will go to [facility] when a ride is available. to drink plenty of water & take OTC for the pain Follow-up by: Elige Radon RN,  August 28, 2009 3:34 PM

## 2010-04-27 NOTE — Assessment & Plan Note (Signed)
Summary: Cough and SOB   Vital Signs:  Patient profile:   60 year old female Weight:      222.7 pounds O2 Sat:      98 % on Room air Temp:     98.6 degrees F oral Pulse rate:   73 / minute BP sitting:   133 / 72  (right arm)  Vitals Entered By: Mauricia Area CMA, (February 08, 2010 10:12 AM)  O2 Flow:  Room air CC: cough and congestion x 1 week. Is Patient Diabetic? Yes Pain Assessment Patient in pain? no        Primary Care Savannah Burns:  Savannah Arn  MD  CC:  cough and congestion x 1 week.Marland Kitchen  History of Present Illness: 60 y/o F with DM, HTN, HLD here for cough and congestion that has worsened over course of 1 week.  COUGH Onset: 1 wk  Description: cough and nasal congestion.   Modifying factors:  Reminds her of when she had PNA years ago.  She becomes dyspnea after walking into exam room from waiting rroom   Symptoms Productive: was dry originally, but now productive Wheezing: yes Dyspnea: yes Nasal discharge: yes Fever: subjective fever Saturday night  Sore throat: no Sick contacts: granddaughter with viral symptoms  Heartburn symptoms:  no History of Asthma: no  Red Flags  Weight loss: no  Hemoptysis: some blood tinge sputum this morning Edema: no  Tobacco: yes, 1/2 ppd, has not smoked in 3 days     Habits & Providers  Alcohol-Tobacco-Diet     Tobacco Status: current     Tobacco Counseling: to quit use of tobacco products     Cigarette Packs/Day: 0.5  Current Medications (verified): 1)  Celexa 40 Mg Tabs (Citalopram Hydrobromide) .... Take 1 Tablet By Mouth Once A Day 2)  Atrovent Hfa 17 Mcg/act  Aers (Ipratropium Bromide Hfa) .... Inhale 2 Puffs Every 4 To 6 Hours As Needed 3)  Metformin Hcl 500 Mg Tabs (Metformin Hcl) .Marland Kitchen.. 1 Tab By Mouth Bid 4)  Glucometer Dex High Control  Liqd (Blood Glucose Calibration) .... Test Cbgs Once Daily. 5)  Glucometer Dex Test Sensors  Strp (Glucose Blood) .... Use As Directed. 6)  Lisinopril 10 Mg Tabs (Lisinopril)  .... One Tab By Mouth Qday 7)  Lovastatin 20 Mg Tabs (Lovastatin) .... One Tab By Mouth Qday 8)  Lasix 20 Mg Tabs (Furosemide) .... Two Tabs By Mouth Daily 9)  Plantar Fascitis Night Splint .... Wear Each Night, Icd9 = 728.71  Allergies (verified): No Known Drug Allergies  Social History: Packs/Day:  0.5  Review of Systems       per hpi   Physical Exam  General:  Well-developed,well-nourished,in no acute distress; alert,appropriate and cooperative throughout examination. vitals reviewed.  Head:  Normocephalic and atraumatic without obvious abnormalities. No apparent alopecia or balding. Nose:  mucosal erythema.   Mouth:  Oral mucosa and oropharynx without lesions or exudates.  Teeth in good repair. Neck:  No deformities, masses, or tenderness noted. Lungs:  normal respiratory effort.   Diffuse insp and exp wheezing No crackles Heart:  Normal rate and regular rhythm. S1 and S2 normal without gallop, murmur, click, rub or other extra sounds. Extremities:  No clubbing, cyanosis, edema, or deformity noted with normal full range of motion of all joints.   Neurologic:  alert & oriented X3.   Cervical Nodes:  No lymphadenopathy noted   Impression & Recommendations:  Problem # 1:  COUGH (ICD-786.2) Assessment New  Cough and  congestion likely bronchitis vs asthma vs cap.  Likely not CAP as pt is afebrile and there are no crackles on exam.  Although she endorses dyspnea her O2 sat is 98% on RA, which is reassuring.  She is wheezing diffusely.  Given her tobacco use and ?asthma history, I will treat as asthma exac.  Will start prednisone 29m x 5 days, doxycycline 1054mtwo times a day x 7 days (as it may be bronchitis) and albuterol inhaler for dyspnea and wheezing.  Pt to rtc on Fri for f/u.    [place] ([address])  Complete Medication List: 1)  Celexa 40 Mg Tabs (Citalopram hydrobromide) .... Take 1 tablet by mouth once a day 2)  Metformin Hcl 500 Mg Tabs (Metformin  hcl) ...Marland Kitchen 1 tab by mouth bid 3)  Glucometer Dex High Control Liqd (Blood glucose calibration) .... Test cbgs once daily. 4)  Glucometer Dex Test Sensors Strp (Glucose blood) .... Use as directed. 5)  Lisinopril 10 Mg Tabs (Lisinopril) .... One tab by mouth qday 6)  Lovastatin 20 Mg Tabs (Lovastatin) .... One tab by mouth qday 7)  Lasix 20 Mg Tabs (Furosemide) .... Two tabs by mouth daily 8)  Plantar Fascitis Night Splint  .... Wear each night, icd9 = 728.71 9)  Doxycycline Hyclate 100 Mg Caps (Doxycycline hyclate) ...Marland Kitchen 1 tab by mouth two times a day for 7 days 10)  Prednisone 20 Mg Tabs (Prednisone) .... 2 tab by mouth daily for 5 days 11)  Ventolin Hfa 108 (90 Base) Mcg/act Aers (Albuterol sulfate) .... 2 puffs inhaled every 4 hours for 2 days, then every 4 hours as needed wheezing or shortness of breath  Patient Instructions: 1)  Please schedule a follow-up appointment on Friday for f/u of cough.  If you feel great on Friday, then you cancel your appointment. 2)  Antibiotic: doxycycline two times a day for 7 days 3)  Steroid: prednisone once a day x 5 days   4)  Inhaler: every 4 hours for first 2 days  5)  Tobacco is very bad for your health and your loved ones ! You should stop smoking !  6)  Stop smoking tips: Choose a quit date. Cut down before the quit date. Decide what you will do as a substitute when you feel the urge to smoke(gum, toothpick, exercise).  Prescriptions: VENTOLIN HFA 108 (90 BASE) MCG/ACT AERS (ALBUTEROL SULFATE) 2 puffs inhaled every 4 hours for 2 days, then every 4 hours as needed wheezing or shortness of breath  #1 x 1   Entered and Authorized by:   CaMerla RichesD   Signed by:   CaMerla RichesD on 02/08/2010   Method used:   Electronically to        [facility][phone](retail)       [place]  [id number]     Ph: [phone]     Fax: [phone] RxID:   [id number] 20 MG  TABS (PREDNISONE) 2 tab by mouth daily for 5 days  #10 x 0   Entered and Authorized by:   CaMerla RichesD   Signed by:   CaMerla RichesD on 02/08/2010   Method used:   Electronically to        [phone](retail)  [place], [place]  [address]       Ph: [phone] or [phone]       Fax: [phone]   RxID:   [id number] DOXYCYCLINE HYCLATE 100 MG CAPS (DOXYCYCLINE HYCLATE) 1 tab by mouth two times a day for 7 days  #14 x 0   Entered and Authorized by:   Merla Riches MD   Signed by:   Merla Riches MD on 02/08/2010   Method used:   Electronically to        [facility]  [phone]* (retail)       [address]       [place], [place]  [address]       Ph: [phone] or [phone]       Fax: [phone]   RxID:   [id number]    Orders Added: 1)  [facility]- Est Level  3 [[address]]

## 2010-04-27 NOTE — Assessment & Plan Note (Signed)
Summary: fx foot? per pt/[place]   Vital Signs:  Patient profile:   60 year old female Height:      62 inches Weight:      224.7 pounds BMI:     41.25 Temp:     97.9 degrees F oral Pulse rate:   71 / minute BP sitting:   129 / 76  (left arm) Cuff size:   regular  Vitals Entered By: Levert Feinstein LPN (October 09, 7060 37:62 AM) CC: right foot pain/swelling Is Patient Diabetic? No Pain Assessment Patient in pain? yes     Location: right foot Intensity: 8   Primary Care Provider:  [name]  MD  CC:  right foot pain/swelling.  History of Present Illness: 1) Right foot pain: since yesterday. lateral foot along 5th metatarsal. stepped in a hole while walking and inverted her ankle - immediately felt sharp pain which progressed to soreness (worse with palpation along 5th metatarsal), swelling at lateral margin of foot to ankle. Tylenol helps somewhat. Denies parasthesia, bruising, prior ankle sprain.   Habits & Providers  Alcohol-Tobacco-Diet     Tobacco Status: current     Tobacco Counseling: to quit use of tobacco products  Current Medications (verified): 1)  Celexa 40 Mg Tabs (Citalopram Hydrobromide) .... Take 1 Tablet By Mouth Once A Day 2)  Atrovent Hfa 17 Mcg/act  Aers (Ipratropium Bromide Hfa) .... Inhale 2 Puffs Every 4 To 6 Hours As Needed 3)  Metformin Hcl 500 Mg Tabs (Metformin Hcl) .Marland Kitchen.. 1 Tab By Mouth Bid 4)  Glucometer Dex High Control  Liqd (Blood Glucose Calibration) .... Test Cbgs Once Daily. 5)  Glucometer Dex Test Sensors  Strp (Glucose Blood) .... Use As Directed. 6)  Lisinopril 10 Mg Tabs (Lisinopril) .... One Tab By Mouth Qday 7)  Lovastatin 20 Mg Tabs (Lovastatin) .... One Tab By Mouth Qday 8)  Lasix 20 Mg Tabs (Furosemide) .... Two Tabs By Mouth Daily 9)  Plantar Fascitis Night Splint .... Wear Each Night, Icd9 = 728.71  Allergies (verified): No Known Drug Allergies  Physical Exam  General:  obese, pleasant, NAD  Msk:  RIGHT foot: no echymosis,  moderate edema, ankle stable, lateral and medial malleoli non-tender, talus non tender, tender to palpation along entire 5th metatarsal but point tender at base >> distal, antalgic gait, full ROM at ankle with pain with inversion     Pulses:  2+ pedals  Neurologic:  sensation intact bilateral lower extremities to light touch    Impression & Recommendations:  Problem # 1:  ANKLE SPRAIN, RIGHT (ICD-845.00) Assessment New Given point tenderness at base of 5th metatarsal, will check films. Films reviewed. No sign of fracture. Will treat with RICE therapy, ROM exercises when improved. NSAID / Tylenol for pain as needed. Follow up as needed, otherwise at regular appointment.   Orders: [place]- Est  Level 4 ([address]214)  Complete Medication List: 1)  Celexa 40 Mg Tabs (Citalopram hydrobromide) .... Take 1 tablet by mouth once a day 2)  Atrovent Hfa 17 Mcg/act Aers (Ipratropium bromide hfa) .... Inhale 2 puffs every 4 to 6 hours as needed 3)  Metformin Hcl 500 Mg Tabs (Metformin hcl) .Marland Kitchen.. 1 tab by mouth bid 4)  Glucometer Dex High Control Liqd (Blood glucose calibration) .... Test cbgs once daily. 5)  Glucometer Dex Test Sensors Strp (Glucose blood) .... Use as directed. 6)  Lisinopril 10 Mg Tabs (Lisinopril) .... One tab by mouth qday 7)  Lovastatin 20 Mg Tabs (Lovastatin) .... One tab  by mouth qday 8)  Lasix 20 Mg Tabs (Furosemide) .... Two tabs by mouth daily 9)  Plantar Fascitis Night Splint  .... Wear each night, icd9 = 728.71  Other Orders: Radiology other (Radiology Other)   Patient Instructions: 1)  Rest your foot today after your Xray. 2)  You can use tylenol or ibuprofen for pain. 3)  I will let you know the results of the xray. 4)  Elevate and ice your foot to help with swelling.

## 2010-04-27 NOTE — Assessment & Plan Note (Signed)
Summary: PLANTAR FASCIITIS TO RT FOOT/[facility]   Vital Signs:  Patient profile:   60 year old female BP sitting:   154 / 12  Vitals Entered By: April Manson CMA (June 16, 2009 11:35 AM)  History of Present Illness: 60 year old with ? PF:  Saw Dr. Sherilyn Cooter - thinks that may have PF, asked to be seen in the [facility] clinic  Gave some exercises. Rotating foot - has been doing those and now foot hurting worse on the R side.   Continual and feels like a hot poker is sitting on her foot. Has been ongoing now for about 6 weeks. Thinks but it may be a - weird position with siting at he computer.    This is notable for worsening pain first thing in the morning when arising and standing after sitting.   Prior foot or ankle fractures: none Prior operations: none Orthotics or bracing: none Medications: tylenol / nsaids PT or home rehab: some HEP Night splints: no Ice massage: no Ball massage: no  Metatarsal pain: no   Allergies: No Known Drug Allergies  Past History:  Past medical, surgical, family and social histories (including risk factors) reviewed, and no changes noted (except as noted below).  Past Medical History: Reviewed history from 04/11/2008 and no changes required. -obesity -hypertension -depression  -Hx of R OM with tympanic membrane perforatoin and hearing loss on 02/08 -Gestational DM, - h/o planter fasciitis bilaterally - L breast cyst (benign) 2004 - R knee sprain 04/2001 -Echo 4/00/86 w/ diastolic dysfunction   Past Surgical History: Reviewed history from 10/13/2006 and no changes required. -Back surgery (?fusion) - 11/27/2003 - Excision of L breast cyst - 04/28/2002  Family History: Reviewed history from 07/23/2007 and no changes required. Father--CAD, CABG x 4, Maternal GM--colon CA (died at 24) Mother: passed away--severe anemia "bone marrow suppression"  Social History: Reviewed history from 07/23/2007 and no changes required. Widowed--husband committed  suicide in 2000; Lives with  daughter; also has a another yo daughter; Smokes 1 PPD x 10 years (had smoked for 10-15 years, then quit for 7 years, but restarted when husband died); Denies alcohol, other drugs; Employed as Consulting civil engineer; previous school bus driver;Was doing walking for exercise but not now--before back surgery was walking 4-5 miles daily  Review of Systems       REVIEW OF SYSTEMS  GEN: No systemic complaints, no fevers, chills, sweats, or other acute illnesses MSK: Detailed in the HPI GI: tolerating PO intake without difficulty Neuro: No numbness, parasthesias, or tingling associated. Otherwise the pertinent positives of the ROS are noted above.    Physical Exam  General:  GEN: Well-developed,well-nourished,in no acute distress; alert,appropriate and cooperative throughout examination HEENT: Normocephalic and atraumatic without obvious abnormalities. No apparent alopecia or balding. Ears, externally no deformities PULM: Breathing comfortably in no respiratory distress EXT: No clubbing, cyanosis, or edema PSYCH: Normally interactive. Cooperative during the interview. Pleasant. Friendly and conversant. Not anxious or depressed appearing. Normal, full affect.  Msk:  R foot Echymosis: no Edema: no ROM: full LE B Gait: heel toe, non-antalgic MT pain: no Callus pattern: none Lateral Mall: NT Medial Mall: NT Talus: NT Navicular: NT Calcaneous: NT Metatarsals: NT 5th MT: NT Phalanges: NT Achilles: NT Plantar Fascia: tender, medial along PF. Pain with forced dorsi Fat Pad: NT Peroneals: NT Post Tib: NT Great Toe: Nml motion Ant Drawer: neg Sensation: intact    Impression & Recommendations:  Problem # 1:  PLANTAR FASCIITIS, RIGHT (ICD-728.71) >25 minutes spent  in face to face time with patient, >50% spent in counselling or coordination of care  We reviewed that stretching is critically important to the treatment of PF. Reviewed footwear. Rigid soles have  been shown to help with PF. Night splints can help - patient given script for one. Reviewed rehab of stretching and calf raises from [facility] Placed in [place] sports insoles and heel cups  Could benefit from a corticosteroid injection if conservative treatment fails.   Complete Medication List: 1)  Celexa 40 Mg Tabs (Citalopram hydrobromide) .... Take 1 tablet by mouth once a day 2)  Atrovent Hfa 17 Mcg/act Aers (Ipratropium bromide hfa) .... Inhale 2 puffs every 4 to 6 hours as needed 3)  Metformin Hcl 500 Mg Tabs (Metformin hcl) .Marland Kitchen.. 1 tab by mouth bid 4)  Glucometer Dex High Control Liqd (Blood glucose calibration) .... Test cbgs once daily. 5)  Glucometer Dex Test Sensors Strp (Glucose blood) .... Use as directed. 6)  Lisinopril 10 Mg Tabs (Lisinopril) .... One tab by mouth qday 7)  Lovastatin 20 Mg Tabs (Lovastatin) .... One tab by mouth qday 8)  Lasix 20 Mg Tabs (Furosemide) .... Two tabs by mouth daily 9)  Plantar Fascitis Night Splint  .... Wear each night, icd9 = 728.71  Patient Instructions: 1)  Massage with a rolling pin OR 2)  Foot massage with tennis ball. 3)  Ice massage. 4)  Night splint at bedtime 5)  NEEDS TO BE DONE EVERY DAY 6)  Recommended over the counter insoles. (Spenco or Hapad) 7)  A rigid shoe with good arch support helps: Dansko (great), Jennet Maduro, [place] 8)  No easily bendable shoes.   Prescriptions: PLANTAR FASCITIS NIGHT SPLINT Wear each night, ICD9 = 728.71  #1 x 0   Entered and Authorized by:   Owens Loffler MD   Signed by:   Owens Loffler MD on 06/16/2009   Method used:   Print then Give to Patient   RxID:   [id number]   Appended Document: PLANTAR FASCIITIS TO RT FOOT/[facility] Reviewed.

## 2010-08-10 NOTE — Assessment & Plan Note (Signed)
[facility]                            CARDIOLOGY OFFICE NOTE   NAME:Savannah Burns, Savannah Burns                   MRN:          [id number]  DATE:07/31/2007                            DOB:          25-Oct-1950    Ms. Beckworth is a 60 year old, obese female who was referred for dyspnea.   The patient has had longstanding dyspnea; however, it has worsened over  the last 3 months. She says she is sleeping in her recliner.   Initial workup by Dr. Deborra Medina included a 2-D echocardiogram done on the  29th. She had no valvular heart disease.  No evidence of cor pulmonale  and normal LV function.  She also had a BNP which was totally normal.   The patient does not describe any significant chest pain. The patient  unfortunately is a widower since, I believe, 2000.  She started smoking  again. She continues to smoke a pack a day. I do not have formal PFTs  but the patient clearly has some component of COPD.   Apparently Dr. Deborra Medina had prescribed an inhaler for the patient.   The patient has chronic lower extremity edema which has also worsened  recently.   She has had some escalating doses of diuretics, but still has lower  extremity edema.   The patient has not had a treadmill test.   Her activity levels are limited by chronic back pain.   She has gained approximately 90 pounds over the last year.   REVIEW OF SYSTEMS:  Remarkable for a mild chronic cough.   PAST MEDICAL HISTORY:  Remarkable for L4-L5 back problems with two rods  and four screws back in 2006. Cystic mass in her breast in 2004, C-  section in '82.   SOCIAL HISTORY:  The patient is disabled.  She has two children.  She is  widowed since 2000. Husband committed suicide. Patient lives with  daughter.  She is fairly inactive. She smokes a pack a day and does not  drink.   FAMILY HISTORY:  Remarkable for her mother dying at age 50 of bone  marrow problems and father dying at age 1 of a heart  problem.   CURRENT MEDICATIONS:  1. Hydrochlorothiazide 25 a day.  2. Lasix 40 a day.  3. Citalopram 20 a day.  4. Zyrtec 10 a day.   NKDA   PHYSICAL EXAMINATION:  Remarkable for a morbidly obese, white female  with a bit of a bronchitic cough.  VITAL SIGNS:  Weight is 285, blood pressure 130/82, pulse 84 and  regular, respiratory rate 18.  HEENT:  Unremarkable.  Carotids normal without bruit, no  lymphadenopathy, thyromegaly or JVP elevation.  LUNGS:  Have a bit of expiratory wheezes with good diaphragmatic motion.  There is an S1, S2 with normal heart sounds. PMI not palpable.  ABDOMEN:  Protuberant.  Liver not tender.  No obvious ascites.  No AAA,  no tenderness, no bruit, no hepatosplenomegaly or hepatojugular  refluxes. Distal pulses are intact with +1 to 2 edema bilaterally.  NEURO:  Nonfocal.  SKIN:  Warm and dry. No muscular weakness.  The patient's echocardiogram was reviewed and is normal with a good EF  and no evidence of significant valvular heart disease.  Her creatinine  is normal at 0.7.   The patient has had mildly elevated LFTs on her lab work.   Her SGOT and SGPT are in the 60 range.   IMPRESSION:  1. Dyspnea unlikely related to heart. The patient's EKG is normal.      Chest x-ray shows no cardiomegaly.  Echocardiogram shows good LV      function and BNP is normal.  2. Dyspnea in the setting of ongoing smoking.  I would agree that the      patient needs pulmonary follow-up with PFTs pre and post      bronchodilator.  I would also check a CT scan to rule out PE, given      the patient's body habitus and lower extremity edema.  3. Lower extremity edema.  Consider checking D-dimer, continue      diuretics. Likely dependent secondary to obesity, low-salt diet.  4. Elevated LFTs. While the patient is having a CT scan to rule out      PE, I think it would be reasonable to scan her belly to make sure      there is not occult ascites or abnormal liver  parenchyma. In review      of her medications, I do not see that she is on anything that could      cause an elevation in her LFTs. She may have fatty liver from her      marked weight gain.  5. Morbid obesity.  The patient needs to stop smoking and start taking      better care of her self.  She has apparently looked into a possible      lap band procedure. From a cardiac perspective, she would be      cleared to have this done.  However, I do think that she needs      further follow-up of her lung disease.   The patient does not need stress testing at this time.   I had a frank discussion with [name] regarding her need to stop  smoking and weight loss.  She will have follow-up with the family  practice doctors and with pulmonary. She will be seen in cardiology on  an as-needed basis.     Wallis Bamberg. Johnsie Cancel, MD, [facility]  Electronically Signed    PCN/MedQ  DD: 07/31/2007  DT: 07/31/2007  Job #: [id number]   cc:   Arnette Norris, M.D.

## 2010-08-13 NOTE — Op Note (Signed)
   NAME:  SHARLETTE, JANSMA                      ACCOUNT NO.:  [id number]   MEDICAL RECORD NO.:  [id number]                   PATIENT TYPE:  AMB   LOCATION:  [place]                                  [facility]   PHYSICIAN:  Rudell Cobb. Annamaria Boots, M.D.                DATE OF BIRTH:  30-Aug-1950   DATE OF PROCEDURE:  05/14/2002  DATE OF DISCHARGE:                                 OPERATIVE REPORT   PREOPERATIVE DIAGNOSIS:  Sebaceous cyst of the skin to the left breast.   POSTOPERATIVE DIAGNOSIS:  Sebaceous cyst of the skin to the left breast.   OPERATION:  Excision of same.   SURGEON:  Rudell Cobb. Annamaria Boots, M.D.   ANESTHESIA:  Local.   OPERATIVE PROCEDURE:  The patient was placed on the operating table and the  area around the sebaceous cyst at about the 11 o'clock position of the left  breast was prepped and draped.  The area was then infiltrated with 1%  Xylocaine with adrenaline.  The sebaceous cyst was then excised and then  closed with interrupted 5-0 nylon sutures.  The size of this cyst was about  1 cm and the simple defect was about 2 x 1 cm. A dressing was applied and  she was discharged home.                                               Rudell Cobb. Annamaria Boots, M.D.    PRY/MEDQ  D:  05/14/2002  T:  05/14/2002  Job:  [id number]

## 2010-08-13 NOTE — Op Note (Signed)
NAMESHENIYA, GARCIAPEREZ                      ACCOUNT NO.:  [id number]   MEDICAL RECORD NO.:  [id number]                   PATIENT TYPE:  INP   LOCATION:  2899                                 FACILITY:  [facility]   PHYSICIAN:  Rennis Harding, M.D.                     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   OPERATION PERFORMED:  1.  Left L4-5 hemilaminectomy and facetectomy with decompression of the left      L5 and L4 nerve roots.  2.  Transforaminal lumbar fusion, L4-5 with placement of 12 mm peak      prosthetic cage packed with bone morphogenic protein.  3.  Pedicle screw instrumentation, L4-5 using [employer] system.  4.  Posterior spinal arthrodesis, L4-5.  5.  Local autogenous bone graft supplemented with bone morphogenic protein      and 5 mL Grafton.  6.  Neuro monitoring with testing of four pedicle screws and monitoring of      free running EMGs times two hours.   SURGEON:  Rennis Harding, M.D.   ASSISTANT:  Karenann Cai, P.A.   ANESTHESIA:  General endotracheal.   COMPLICATIONS:  None.   INDICATIONS FOR PROCEDURE:  The patient is a very pleasant 60 year old  female with persistent back and left lower extremity pain secondary to a  degenerative spondylolisthesis with lateral recess narrowing.  She has been  refractory to all conservative treatment options and elected to undergo  decompression and fusion in hopes of improving her symptoms.   DESCRIPTION OF PROCEDURE:  The patient was properly identified in the  holding area and taken to the operating room.  She underwent general  endotracheal anesthesia without difficulty.  She was  given prophylactic IV  antibiotics.  She was carefully turned prone on the Wilson frame.  All bony  prominences were padded.  Face and eyes were protected at all times.  Back  prepped and draped in the usual sterile fashion.  A midline incision was  made over the L4-5 level.  Dissection was carried sharply through the deep  adipose layer.  The deep fascia was incised and the paraspinal muscles  elevated out over the tips of the transverse process of L4 and L5.  Deep  retractors were placed.  We then placed pedicle screws at L4 and L5 using  anatomic probing technique.  Each pedicle hole was palpated using a ball tip  feeler and there were no breeches.  6.5 x 50 mm screws utilized at L4  bilaterally.  6.5 x 45 mm screws utilized at L5 bilaterally.  The bone  quality was soft.  The screw purchase was adequate.  Each screw was then  stimulated using triggered EMGs and there were no deleterious changes.  We  then turned our attention to performing a hemilaminectomy on the left.  The  L4 and L5 nerve roots were identified and decompressed radically.  An  inferior facetectomy was completed by osteotomizing the pars of L4.  The L5  superior facet was cut flush with the pedicle.  Lateral recess decompression  was completed by removing the overhanging superior facet of L5.  We felt we  had a good decompression of both the L4 and L5 nerve roots.  We then turned  our attention to performing a TLIF procedure.  The annulus was incised.  Various instruments were used to complete a radical diskectomy and  cartilaginous end plates were scraped.  The disk space was dilated up to 12  mm.  We then packed the disk space with a bone morphogenic protein sponge  from the small InFuse set.  Local autogenous bone graft collected from the  laminectomy was also packed into the interspace. We then inserted a 12 mm  PEAK prosthetic cage that had been filled with the bone morphogenic protein  sponge into the interspace  and this was carefully tamped obliquely and  anteriorly.  We then turned our attention to performing a posterior spinal  arthrodesis.  High speed bur used to decorticate the L4 and L5 transverse  processes bilaterally.  The remaining bone graft was packed into the lateral  gutters and supplemented with 5 mL of Grafton.  The short segment titanium  rods were placed and the locking caps sheared off after gentle compression  was applied across the segment.  A deep Hemovac drain was left.  Deep fascia  closed with a running #1 Vicryl suture, subcutaneous layer closed with 0  Vicryl and 2-0 Vicryl suture followed by a running 3-0 subcuticular Vicryl  suture on the skin edges. Benzoin and Steri-Strips placed.  Sterile dressing  applied.  Free running EMGs were monitored throughout the procedure and  there were no deleterious changes.                                               Rennis Harding, M.D.    MC/MEDQ  D:  12/03/2003  T:  12/03/2003  Job:  [id number]

## 2010-08-13 NOTE — Discharge Summary (Signed)
NAMEPHILOMENA, Savannah Burns            ACCOUNT NO.:  [id number]   MEDICAL RECORD NO.:  [id number]          PATIENT TYPE:  INP   LOCATION:  5038                         FACILITY:  [facility]   PHYSICIAN:  Rennis Harding, M.D.        DATE OF BIRTH:  1951-03-22   DATE OF ADMISSION:  12/03/2003  DATE OF DISCHARGE:  12/06/2003                                 DISCHARGE SUMMARY   ADMISSION DIAGNOSES:  1.  L4-L5 spondylolisthesis.  2.  History of anxiety.  3.  Mild hyperglycemia.   DISCHARGE DIAGNOSES:  1.  Status post L4-L5 posterior spinal fusion with pedicle screws, doing      well.  2.  Postoperative hemorrhagic anemia which is mild, asymptomatic and did not      require transfusion.  3.  Mild hyperglycemia.   CONSULTATIONS:  None.   PROCEDURE:  On December 03, 2003, the patient was taken to the operating  room for an L4-L5 posterior spinal fusion with pedicle screws.  This was  done by Dr. Patrice Paradise, assistant [facility], P.A.-C.  Anesthesia was general.   LABORATORY DATA AND X-RAY FINDINGS:  Preoperatively, CBC with differential  was within normal limits.  Postoperatively, H&Hs were monitored daily and  reached a low of 11.4 and 32.8 on December 06, 2003.  She was asymptomatic  and did not require transfusion.  PT/INR and PTT preoperative were normal.  Complete metabolic panel preoperatively was normal except glucose elevated  at 120.  Basic metabolic panel 1 day postoperatively continued to have  glucose elevated at 125, otherwise normal.  Blood type from September 6, was  type A, antibody screen negative.   EKG from September 6, showed normal sinus rhythm, no previous tracing, read  by Dr. Quay Burow.  X-rays with portable spine was used on September 7,  intraoperatively for localization of L4-L5 fusion.  On September 10, two  views of the lumbar spine showed status post L4-L5 fusion without evidence  of hardware complication.   HISTORY OF PRESENT ILLNESS:  The patient is a  60 year old female who has had  a long history of back pain with increasing severity despite all  conservative measures including antiinflammatory medications, narcotic pain  medications, physical therapy as well as ejections and ejections actually  made her pain worse.  Secondary to failure to improve with conservative  measures and as well as her continuing increasing pain and interference of  activities of daily living and quality of life, it was felt that her best  course of management would be posterior spinal fusion at L4-L5.  Risks and  benefits of the procedure were discussed with the patient by Dr. Patrice Paradise, as  well as myself.  She indicated understanding to proceed.   HOSPITAL COURSE:  The patient was admitted on December 03, 2003, and taken  to the operating room for the above-listed procedure.  She tolerated the  procedure well without any intraoperative complications.  She was  transferred to the recovery room in stable condition.  There was one Hemovac  drain placed intraoperatively with minimal blood loss.  Postoperatively,  routine orthopedic spine protocol  was followed  including 48 hours of  prophylactic antibiotics.  The diet was slowly advanced to a regular diet  after flatus.  She had no difficulty with this.  Her pain control was  maintained using a combination of PCA as well as p.o. analgesics and  transitioned over to p.o. analgesics on postop day #2.  DVT prophylaxis was  obtained using combination of TED hose as well as early mobilization with  physical therapy and occupational therapy.  PT/OT did visit with the patient  on a daily basis working on progressive ambulation program, brace use and  safety as well as back precautions.  She did extremely well throughout her  postoperative course and did not develop any medical complications.  By  postop day #3, on December 06, 2003, the patient had met all orthopedic  goals.  She was medically stable and ready for  discharge home.   CONDITION ON DISCHARGE:  The patient is a 60 year old female, status post L4-  L5 posterior spinal fusion doing well.   ACTIVITY:  Daily ambulation.  Brace on when she is up.  Back precautions at  all times.  No lifting heavier than 5 pounds.   WOUND CARE:  Dressing changed daily.  Keep incision dry x5 days.   FOLLOW UP:  Follow up 2 weeks postoperatively with Dr. Patrice Paradise.  Call for an  appointment.   DIET:  Regular as tolerated.   DISCHARGE MEDICATIONS:  1.  Vicodin for pain.  2.  Robaxin for muscle spasm.  3.  Multivitamin daily.  4.  Calcium daily.  5.  Laxative as needed.  6.  Regular home medications.   DISPOSITION:  The patient is being discharged to her home with her family  assistance as well as home health physical and occupational therapy through  [place].       CM/MEDQ  D:  01/28/2004  T:  01/28/2004  Job:  [id number]

## 2010-08-13 NOTE — Op Note (Signed)
NAME:  Savannah Burns, Savannah Burns                      ACCOUNT NO.:  [id number]   MEDICAL RECORD NO.:  [id number]                   PATIENT TYPE:  INP   LOCATION:  NA                                   FACILITY:  [facility]   PHYSICIAN:  Rennis Harding, M.D.                     DATE OF BIRTH:  03/24/1951   DATE OF PROCEDURE:  DATE OF DISCHARGE:                                 OPERATIVE REPORT   Audio too short to transcribe (less than 5 seconds)                                               Rennis Harding, M.D.    MC/MEDQ  D:  12/03/2003  T:  12/03/2003  Job:  [id number]

## 2010-08-19 ENCOUNTER — Ambulatory Visit (INDEPENDENT_AMBULATORY_CARE_PROVIDER_SITE_OTHER): Payer: Medicare Other | Admitting: Family Medicine

## 2010-08-19 DIAGNOSIS — L989 Disorder of the skin and subcutaneous tissue, unspecified: Secondary | ICD-10-CM

## 2010-08-19 DIAGNOSIS — E119 Type 2 diabetes mellitus without complications: Secondary | ICD-10-CM

## 2010-08-19 DIAGNOSIS — I10 Essential (primary) hypertension: Secondary | ICD-10-CM

## 2010-08-19 LAB — POCT GLYCOSYLATED HEMOGLOBIN (HGB A1C): Hemoglobin A1C: 5.9

## 2010-08-19 MED ORDER — LISINOPRIL 10 MG PO TABS: 10.0000 mg | ORAL_TABLET | Freq: Every day | ORAL | Status: DC

## 2010-08-19 NOTE — Assessment & Plan Note (Addendum)
Near goal, not on ACE-I will restart low-dose ACE today, follow up one week for labs.

## 2010-08-19 NOTE — Patient Instructions (Signed)
You have been doing a good job with your diabetes Restart your lisinopril for blood pressure. Avoid irritating that spot on your leg to allow it to heal (avoid shaving, other types of trauma). It looks like a DERMATOFIBROMA - this is a benign skin condition that occurs at the site of trauma  Come in next week for lab work. Follow up in three months

## 2010-08-19 NOTE — Progress Notes (Signed)
  Subjective:    Patient ID: Savannah Burns, female    DOB: 06/30/1950, 60 y.o.   MRN: [id number]  HPI  1) HTN: Does not check BP at home. Has not been taking ACE-I. Does have chronic lower extremity edema relieved by Lasix. Denies chest pain, dyspnea, HA, neurological symptoms.   2) DM2: Last A1C 5.9 (December 2009). Not on ACE-I. On metformin. No episodes of hypoglycemia. Denies vision change, polyuria, polydipsia. Has not called ophthalmologist yet.   3) Bump on left leg: Small raised lesion anterior lower left leg. She had bumped her leg at the same site 1 month ago - then developed this lesion. Usually not painful unless she irritates it while shaving. No prior history of similar lesions.   Pertinent prior history reviewed   Review of Systems As per HPI     Objective:   Physical Exam General: Well appearing, pleasant, NAD  Skin: Firm, hyperpigmented, papular lesion, 0.9 x 0.9 cm with surrounding erythema and irritation left lower anterior leg, tender to palpation, no telangiectasia or waxy appearance noted, negative dimple squeeze test       Assessment & Plan:

## 2010-08-19 NOTE — Assessment & Plan Note (Addendum)
History of prior trauma consistent with irritated dermatofibroma, though appearance somewhat concerning for basal cell carcinoma as well (though no telangiectasia noted). Advised that she should avoid activities such as shaving that would cause repetitive trauma, and follow up in three months. At that time, if lesion still looks suspicious would perform excisional biopsy (with electrocautery of base) and send to pathology.

## 2010-08-19 NOTE — Assessment & Plan Note (Signed)
At goal. Follow up three months. Reviewed diet and exercise recommendations.

## 2010-08-26 ENCOUNTER — Other Ambulatory Visit: Payer: Medicare Other

## 2010-09-22 ENCOUNTER — Other Ambulatory Visit: Payer: Self-pay | Admitting: Family Medicine

## 2010-09-27 ENCOUNTER — Other Ambulatory Visit: Payer: Self-pay | Admitting: Family Medicine

## 2010-09-27 MED ORDER — CITALOPRAM HYDROBROMIDE 40 MG PO TABS: 40.0000 mg | ORAL_TABLET | Freq: Every day | ORAL | Status: DC

## 2010-09-27 MED ORDER — LOVASTATIN 20 MG PO TABS: 20.0000 mg | ORAL_TABLET | Freq: Every day | ORAL | Status: DC

## 2010-09-27 MED ORDER — ALBUTEROL SULFATE HFA 108 (90 BASE) MCG/ACT IN AERS: 2.0000 | INHALATION_SPRAY | RESPIRATORY_TRACT | Status: DC | PRN

## 2010-09-27 MED ORDER — METFORMIN HCL 500 MG PO TABS: 500.0000 mg | ORAL_TABLET | Freq: Two times a day (BID) | ORAL | Status: DC

## 2010-09-27 MED ORDER — FUROSEMIDE 20 MG PO TABS: 40.0000 mg | ORAL_TABLET | Freq: Every day | ORAL | Status: DC

## 2010-10-20 ENCOUNTER — Ambulatory Visit (INDEPENDENT_AMBULATORY_CARE_PROVIDER_SITE_OTHER): Payer: Medicare Other | Admitting: Family Medicine

## 2010-10-20 VITALS — BP 120/67 | HR 71 | Temp 99.0°F | Wt 230.0 lb

## 2010-10-20 DIAGNOSIS — L600 Ingrowing nail: Secondary | ICD-10-CM

## 2010-10-20 MED ORDER — HYDROCODONE-ACETAMINOPHEN 5-500 MG PO TABS: 1.0000 | ORAL_TABLET | ORAL | Status: DC | PRN

## 2010-10-20 MED ORDER — HYDROCODONE-ACETAMINOPHEN 7.5-500 MG PO TABS: 1.0000 | ORAL_TABLET | ORAL | Status: DC | PRN

## 2010-10-20 NOTE — Progress Notes (Signed)
  Subjective:    Patient ID: [name], female    DOB: March 21, 1951, 60 y.o.   MRN: [id number]  HPI bhilateral tow pain with ingrown toenails.  No drainage, mild swelling/erythema on right.  Has been present for months.  No fevers   Review of Systems    Denies CP, SOB, HA, N/V/D, fever  Objective:   Physical Exam    Vital signs reviewed General appearance - alert, well appearing, and in no distress and oriented to person, place, and time Feet-  Left great toe with curved nail, thickened, without redness Right great toe- thickened nail, mild erythema, no drainage.    Assessment & Plan:    Consent obtained, time out preformed.  Area cleaned with betadine.  tourniquit applied to base of toe.  Lidocaine without epi injected at base of toe.  Spatula used to life toenail and clippers to cut.  Medication applied to nail matrix.

## 2010-10-20 NOTE — Patient Instructions (Signed)
Make an appt to come back for the other toenail Take the vicodin today for pain Take tylenol for pain after 1 day  You should have enough vicodin for both toe procedures

## 2010-10-20 NOTE — Assessment & Plan Note (Signed)
Bilateral toes with ingrown toenails. Pt wants them removed. Will do right side today, come back for left

## 2010-10-27 ENCOUNTER — Ambulatory Visit: Payer: Medicare Other | Admitting: Family Medicine

## 2010-11-03 ENCOUNTER — Ambulatory Visit (INDEPENDENT_AMBULATORY_CARE_PROVIDER_SITE_OTHER): Payer: Medicare Other | Admitting: Family Medicine

## 2010-11-03 DIAGNOSIS — L600 Ingrowing nail: Secondary | ICD-10-CM

## 2010-11-03 DIAGNOSIS — B372 Candidiasis of skin and nail: Secondary | ICD-10-CM

## 2010-11-03 MED ORDER — HYDROCODONE-ACETAMINOPHEN 5-500 MG PO TABS: 1.0000 | ORAL_TABLET | ORAL | Status: DC | PRN

## 2010-11-03 NOTE — Assessment & Plan Note (Signed)
Below breast and on abdomen.  rec OTC antifungal

## 2010-11-03 NOTE — Progress Notes (Signed)
  Subjective:    Patient ID: Savannah Burns, female    DOB: 06-28-1950, 60 y.o.   MRN: [id number]  HPI  Needs toenail removed.   Also rash on abd.  Has been there for months, worse with heat and sweat.  Has tried steroid without relief.  Itches. No fevers.  Review of Systems Denies CP, SOB, HA, N/V/D, fever     Objective:   Physical Exam  Vital signs reviewed General appearance - alert, well appearing, and in no distress and oriented to person, place, and time Heart - normal rate, regular rhythm, normal S1, S2, no murmurs, rubs, clicks or gallops Chest - clear to auscultation, no wheezes, rales or rhonchi, symmetric air entry, no tachypnea, retractions or cyanosis Skin- numnular lesions on abdomen reddened, some scale.      Assessment & Plan:  Ingrown toenail Removal of left toenail today.  Consent signed.  Alcohol applied.  torniquet applied.  3 cc lidocaine without epi injected.  toemail lifted and phenol applied to toe matrix.  Bandage applied to toe.  vicodin given today for pain  Candidal dermatitis Below breast and on abdomen.  rec OTC antifungal

## 2010-11-03 NOTE — Assessment & Plan Note (Signed)
Removal of left toenail today.  Consent signed.  Alcohol applied.  torniquet applied.  3 cc lidocaine without epi injected.  toemail lifted and phenol applied to toe matrix.  Bandage applied to toe.  vicodin given today for pain

## 2011-01-28 ENCOUNTER — Ambulatory Visit (INDEPENDENT_AMBULATORY_CARE_PROVIDER_SITE_OTHER): Payer: Medicare Other | Admitting: Family Medicine

## 2011-01-28 ENCOUNTER — Encounter: Payer: Self-pay | Admitting: Family Medicine

## 2011-01-28 DIAGNOSIS — B372 Candidiasis of skin and nail: Secondary | ICD-10-CM

## 2011-01-28 DIAGNOSIS — E669 Obesity, unspecified: Secondary | ICD-10-CM

## 2011-01-28 DIAGNOSIS — E119 Type 2 diabetes mellitus without complications: Secondary | ICD-10-CM

## 2011-01-28 DIAGNOSIS — M25569 Pain in unspecified knee: Secondary | ICD-10-CM

## 2011-01-28 DIAGNOSIS — M25562 Pain in left knee: Secondary | ICD-10-CM | POA: Insufficient documentation

## 2011-01-28 LAB — COMPREHENSIVE METABOLIC PANEL
CO2: 27 mEq/L (ref 19–32)
Calcium: 9.4 mg/dL (ref 8.4–10.5)
Creat: 0.59 mg/dL (ref 0.50–1.10)
Glucose, Bld: 68 mg/dL — ABNORMAL LOW (ref 70–99)
Total Bilirubin: 0.3 mg/dL (ref 0.3–1.2)

## 2011-01-28 LAB — LIPID PANEL
Cholesterol: 165 mg/dL (ref 0–200)
Total CHOL/HDL Ratio: 3.9 Ratio
Triglycerides: 84 mg/dL (ref <150)
VLDL: 17 mg/dL (ref 0–40)

## 2011-01-28 NOTE — Assessment & Plan Note (Signed)
Slightly improved from last visit per patient. Continue current regimen. Consider higher potency antifungal at followup if still not improving.

## 2011-01-28 NOTE — Progress Notes (Signed)
S: Pt comes in today for knee pain.  She's been having pain in her left knee for the past week.  KNEE PAIN Patient has been having left knee pain for the past week. She states that she had to stop suddenly a few days prior to the pain starting when her granddaughter ran in front of her. Her foot turned sideways and she jerked her knee to stop. She did not have any pain at that time. She has not had any falls and has not twisted her knee. She thought that it may be a pulled muscle but has not yet improved and so she wanted to be seen for this. Patient reports that the pain is achy located deep in her knee she rates the pain as 6-7/10 with movement. If she is sitting in a recliner she has no pain. The pain is the worse when she first stands up or when she tries to turn her foot inward.  When she tries to turn her foot inward it as a 10/10.  Patient has not had any warmth or redness over her knee.  It is not tender when she touches it. She states that the bottom part of her knee was swollen initially however this is improved. She has not had any fevers or chills. She tried ibuprofen but did not think this helped. Patient does have Vicodin for bad back pain, however has not tried this for her knee pain. She does not have any known chronic knee problems or issues. She has not fallen since this has happened and does not feel like her knee locks, groins, or gives out on her.  OBESITY According to the patient, she lost 75 pounds when she found she was diabetic. She admits to having gained at least 15 pounds back. She knows that she should lose weight, has just been waiting for "a reason."  CANDIDAL RASH Has been using athlete's foot cream on her abdominal rash as directed by her PCP. She feels that the redness has improved however it is still scaly and itchy. She states she's been using this for almost one month. She's not noticed that it is worsened but rather has slightly improved just not as quickly as she would  like.  ROS: Per HPI  History  Smoking status  . Current Everyday Smoker  Smokeless tobacco  . Not on file    O:  Filed Vitals:   01/28/11 0947  BP: 128/70  Pulse: 66    Gen: NAD Abd: soft, NT, red scaly rash over anterior abdomen in linear pattern at sweat pant waist line Ext: Warm, no chronic skin changes, no edema; right knee normal; left knee without significant effusion, + pain with internal and external rotation of the foot and extension of the knee; no ligament instability, no catching or popping on exam, no baker's cyst, no joint line tenderness; pain is deep posterior without any palpable superficial pain, no bruising, redness, erythema, or warmth   A/P: 60 y.o. female p/w left knee pain, likely strain vs small meniscal tear -See problem list -f/u PRN

## 2011-01-28 NOTE — Assessment & Plan Note (Signed)
Discussed importance of weight loss with regard to recurrent knee injury in helping to prevent further any injury. Patient states she lost approximately 75 pounds which, she was diabetic but has gained approximately 15 of those back. Patient will try to lose weight.

## 2011-01-28 NOTE — Assessment & Plan Note (Signed)
Muscle strain versus small meniscal tear. No need for imaging at this time. Ligaments appear stable. No obvious erythema, edema, tenderness to palpation, or joint effusion. Patient is to followup if not improving in the next 2-3 weeks. See patient instructions for more details.

## 2011-01-28 NOTE — Patient Instructions (Signed)
It was nice to meet you! I'm sorry that your knee is bothering you. This is most likely from either a muscle strain or a small meniscal tear. Neither one of these are dangerous or require any specific treatment at this time. We may consider doing some imaging of her knee if you're still having significant pain after the next 2 weeks or so. You can take Motrin 600 mg every 6-8 hours and/or Tylenol 650 mg every 4-6 hours as needed for pain. Try to keep your leg elevated when you're able to help with healing. I do not think that you need to stay off of your legs however. I think that you can continue your normal activities as your pain tolerates. As we discussed, weight loss would be very beneficial in helping to prevent any further knee injuries. For every 1# you lose, it takes about 5# off of each knee!!! Please come back and see her regular doctor in the next few weeks if you're knee has not started to improve. Please remember that your knee pain we'll likely wax and wane as you use it more or less.      Knee Sprain You have a knee sprain. Sprains are painful injuries to the joints. A sprain is a partial or complete tearing of ligaments. Ligaments are tough, fibrous tissues that hold bones together at the joints. A strain (sprain) has occurred when a ligament is stretched or damaged. This injury may take several weeks to heal. This is often the same length of time as a bone fracture (break in bone) takes to heal. Even though a fracture (bone break) may not have occurred, the recovery times may be similar. HOME CARE INSTRUCTIONS   Rest the injured area for as long as directed by your caregiver. Then slowly start using the joint as directed by your caregiver and as the pain allows. Use crutches as directed. If the knee was splinted or casted, continue use and care as directed. If an ace bandage has been applied today, it should be removed and reapplied every 3 to 4 hours. It should not be applied tightly,  but firmly enough to keep swelling down. Watch toes and feet for swelling, bluish discoloration, coldness, numbness or excessive pain. If any of these symptoms occur, remove the ace bandage and reapply more loosely.If these symptoms persist, seek medical attention.   For the first 24 hours, lie down. Keep the injured extremity elevated on two pillows.   Apply ice to the injured area for 15 to 20 minutes every couple hours. Repeat this 3 to 4 times per day for the first 48 hours. Put the ice in a plastic bag and place a towel between the bag of ice and your skin.   Wear any splinting, casting, or elastic bandage applications as instructed.   Only take over-the-counter or prescription medicines for pain, discomfort, or fever as directed by your caregiver. Do not use aspirin immediately after the injury unless instructed by your caregiver. Aspirin can cause increased bleeding and bruising of the tissues.   If you were given crutches, continue to use them as instructed. Do not resume weight bearing on the affected extremity until instructed.  Persistent pain and inability to use the injured area as directed for more than 2 to 3 days are warning signs. If this happens you should see a caregiver for a follow-up visit as soon as possible. Initially, a hairline fracture (this is the same as a broken bone) may not be evident  on x-rays. Persistent pain and swelling indicate that further evaluation, non-weight bearing (use of crutches as instructed), and/or further x-rays are indicated. X-rays may sometimes not show a small fracture until a week or ten days later. Make a follow-up appointment with your own caregiver or one to whom we have referred you. A radiologist (specialist in reading x-rays) may re-read your X-rays. Make sure you know how you are to get your x-ray results. Do not assume everything is normal if you do not hear from [place]. SEEK MEDICAL CARE IF:   Bruising, swelling, or pain increases.   You  have cold or numb toes   You have continuing difficulty or pain with walking.  SEEK IMMEDIATE MEDICAL CARE IF:   Your toes are cold, numb or blue.   The pain is not responding to medications and continues to stay the same or get worse.  MAKE SURE YOU:   Understand these instructions.   Will watch your condition.   Will get help right away if you are not doing well or get worse.  Document Released: 03/14/2005 Document Revised: 11/24/2010 Document Reviewed: 02/26/2007 [facility] Patient Information 2012 [place].  Knee, Cartilage (Meniscus) Injury It is suspected that you have a torn cartilage (meniscus) in your knee. The menisci are made of tough cartilage, and fit between the surfaces of the thigh and leg bones. The menisci are "C"shaped and have a wedged profile. The wedged profile helps the stability of the joint by keeping the rounded femur surface from sliding off the flat tibial surface. The menisci are fed (nourished) by small blood vessels; but there is also a large area at the inner edge of the meniscus that does not have a good blood supply (avascular). This presents a problem when there is an injury to the meniscus, because areas without good blood supply heal poorly. As a result when there is a torn cartilage in the knee, surgery is often required to fix it. This is usually done with a surgical procedure less invasive than open surgery (arthroscopy). Some times open surgery of the knee is required if there is other damage. PURPOSE OF THE MENISCUS The medial meniscus rests on the medial tibial plateau. The tibia is the large bone in your lower leg (the shin bone). The medial tibial plateau is the upper end of the bone making up the inner part of your knee. The lateral meniscus serves the same purpose and is located on the outside of the knee. The menisci help to distribute your body weight across the knee joint; they act as shock absorbers. Without the meniscus present, the weight  of your body would be unevenly applied to the bones in your legs (the femur and tibia). The femur is the large bone in your thigh. This uneven weight distribution would cause increased wear and tear on the cartilage lining the joint surfaces, leading to early damage (arthritis) of these areas. The presence of the menisci cartilage is necessary for a healthy knee. PURPOSE OF THE KNEE CARTILAGE The knee joint is made up of three bones: the thigh bone (femur), the shin bone (tibia), and the knee cap (patella). The surfaces of these bones at the knee joint are covered with cartilage called articular cartilage. This smooth slippery surface allows the bones to slide against each other without causing bone damage. The meniscus sits between these cartilaginous surfaces of the bones. It distributes the weight evenly in the joints and helps with the stability of the joint (keeps the joint  steady). HOME CARE INSTRUCTIONS  Use crutches and external braces as instructed.   Once home an ice pack applied to your injured knee may help with discomfort and keep the swelling down. An ice pack can be used for the first couple of days or as instructed.   Only take over-the-counter or prescription medicines for pain, discomfort, or fever as directed by your caregiver.   Call if you do not have relief of pain with medications or if there is increasing in pain.   Call if your foot becomes cold or blue.   You may resume normal diet and activities as directed.   Make sure to keep your appointment with your follow-up caregiver. This injury may require further evaluation and treatment beyond the temporary treatment given today.  Document Released: 06/04/2002 Document Revised: 11/24/2010 Document Reviewed: 09/26/2008 [facility] Patient Information 2012 [place].

## 2011-02-21 ENCOUNTER — Ambulatory Visit: Payer: BC Managed Care – PPO | Admitting: Family Medicine

## 2011-04-27 ENCOUNTER — Other Ambulatory Visit: Payer: Self-pay | Admitting: Family Medicine

## 2011-06-06 ENCOUNTER — Encounter: Payer: Self-pay | Admitting: Family Medicine

## 2011-06-06 ENCOUNTER — Ambulatory Visit (INDEPENDENT_AMBULATORY_CARE_PROVIDER_SITE_OTHER): Payer: Medicare Other | Admitting: Family Medicine

## 2011-06-06 VITALS — BP 128/73 | HR 79 | Temp 98.4°F | Ht 62.0 in | Wt 234.0 lb

## 2011-06-06 DIAGNOSIS — J449 Chronic obstructive pulmonary disease, unspecified: Secondary | ICD-10-CM

## 2011-06-06 DIAGNOSIS — E119 Type 2 diabetes mellitus without complications: Secondary | ICD-10-CM

## 2011-06-06 DIAGNOSIS — F17200 Nicotine dependence, unspecified, uncomplicated: Secondary | ICD-10-CM

## 2011-06-06 DIAGNOSIS — J069 Acute upper respiratory infection, unspecified: Secondary | ICD-10-CM

## 2011-06-06 DIAGNOSIS — J309 Allergic rhinitis, unspecified: Secondary | ICD-10-CM | POA: Insufficient documentation

## 2011-06-06 MED ORDER — NICOTINE 7 MG/24HR TD PT24: 1.0000 | MEDICATED_PATCH | TRANSDERMAL | Status: AC

## 2011-06-06 MED ORDER — MOMETASONE FUROATE 50 MCG/ACT NA SUSP: 2.0000 | Freq: Every day | NASAL | Status: DC

## 2011-06-06 MED ORDER — NICOTINE 14 MG/24HR TD PT24: 1.0000 | MEDICATED_PATCH | TRANSDERMAL | Status: AC

## 2011-06-06 NOTE — Assessment & Plan Note (Signed)
Check A 1C today.  Continue metformin.

## 2011-06-06 NOTE — Progress Notes (Signed)
  Subjective:    Patient ID: Savannah Burns, female    DOB: 1950/08/02, 61 y.o.   MRN: [id number]  HPI DM- taking metformin.  Has had 3 episodes of lows to 70s, which make her feel shakey.  She quickly feels better with a glucose tablet. No diszziness  Cough- 1 month of intermittent drainage, cough with occasional post tussive emesis.  Hurts in her ribs when she coughs.  Had fever Thursday, Friday, now normal.  When this started she had watery eyes, runny nose, sneezing.  Has been using afrin.  Tobacco- would like to quit.  Currently using 3/4 ppd of light cigs.  Smokes within 15 min of waking up.  Quit before without medicaiton.  Does not want chantix b/c husband committed suicide.     Review of Systems     Objective:   Physical Exam        Assessment & Plan:  Allergic rhinitis Allergy symptoms.  Start nasonex to see if it helps.    COPD Cough today but no wheeze, not productive, O2 sat good.  I would not call this an exacerbation.  Diabetes mellitus, type 2 Check A 1C today.  Continue metformin.   TOBACCO DEPENDENCE Gave info for quitline [place], patches, schedule with Dr. Valentina Lucks if needing additional help.  URI (upper respiratory infection) Cough, runny nose, fever x 2 days but now afebrile.  Symptomatic tx.

## 2011-06-06 NOTE — Assessment & Plan Note (Signed)
Cough, runny nose, fever x 2 days but now afebrile.  Symptomatic tx.

## 2011-06-06 NOTE — Assessment & Plan Note (Signed)
Cough today but no wheeze, not productive, O2 sat good.  I would not call this an exacerbation.

## 2011-06-06 NOTE — Assessment & Plan Note (Signed)
Gave info for quitline [place], patches, schedule with Dr. Valentina Lucks if needing additional help.

## 2011-06-06 NOTE — Assessment & Plan Note (Signed)
Allergy symptoms.  Start nasonex to see if it helps.

## 2011-06-06 NOTE — Patient Instructions (Signed)
Please check out [facility] on the internet. The last I heard, they had patches available  I sent in patches for you to the pharmacy in case your insurance will cover them  Please consider an appt with Dr. Valentina Lucks for smoking cessation  I have sent in nasonex for your allergies  Please stop afrin Start saline spray

## 2011-08-05 ENCOUNTER — Ambulatory Visit (INDEPENDENT_AMBULATORY_CARE_PROVIDER_SITE_OTHER): Payer: Medicare Other | Admitting: Family Medicine

## 2011-08-05 ENCOUNTER — Encounter: Payer: Self-pay | Admitting: Family Medicine

## 2011-08-05 ENCOUNTER — Ambulatory Visit
Admission: RE | Admit: 2011-08-05 | Discharge: 2011-08-05 | Disposition: A | Discharge status: Home / Self Care | Payer: Medicare Other | Source: Ambulatory Visit | Attending: Family Medicine | Admitting: Family Medicine

## 2011-08-05 VITALS — BP 137/77 | HR 102 | Temp 99.2°F | Ht 62.0 in | Wt 241.0 lb

## 2011-08-05 DIAGNOSIS — M25511 Pain in right shoulder: Secondary | ICD-10-CM

## 2011-08-05 DIAGNOSIS — M25519 Pain in unspecified shoulder: Secondary | ICD-10-CM

## 2011-08-05 NOTE — Progress Notes (Signed)
  Subjective:    Patient ID: Savannah Burns, female    DOB: 1950/05/08, 61 y.o.   MRN: [id number]  HPI R shoulder pain X3-4 days. Has history of intermittent shoulder pain in the past.  No known trauma.  Woke up with significan shoulder pain.  No radicular sxs.  No swelling.  No paresthesias.  Pain worse with external rotation. Grip strength WNL.   Review of Systems See HPI, otherwise ROS negative     Objective:   Physical Exam Gen: obese, NAD Shoulder: Inspection reveals no abnormalities, atrophy or asymmetry. Palpation is normal with no tenderness over AC joint or bicipital groove. + Pain with internal external rotation of shoulder./  Rotator cuff strength normal throughout. +empty can  Speeds and Yergason's tests normal. No labral pathology noted with negative Obrien's, negative clunk and good stability. Normal scapular function observed. No painful arc and no drop arm sign. No apprehension sign    Assessment & Plan:

## 2011-08-05 NOTE — Patient Instructions (Signed)
Shoulder Pain The shoulder is a ball and socket joint. The muscles and tendons (rotator cuff) are what keep the shoulder in its joint and stable. This collection of muscles and tendons holds in the head (ball) of the humerus (upper arm bone) in the fossa (cup) of the scapula (shoulder blade). Today no reason was found for your shoulder pain. Often pain in the shoulder may be treated conservatively with temporary immobilization. For example, holding the shoulder in one place using a sling for rest. Physical therapy may be needed if problems continue. HOME CARE INSTRUCTIONS   Apply ice to the sore area for 15 to 20 minutes, 3 to 4 times per day for the first 2 days. Put the ice in a plastic bag. Place a towel between the bag of ice and your skin.   If you have or were given a shoulder sling and straps, do not remove for as long as directed by your caregiver or until you see a caregiver for a follow-up examination. If you need to remove it to shower or bathe, move your arm as little as possible.   Sleep on several pillows at night to lessen swelling and pain.   Only take over-the-counter or prescription medicines for pain, discomfort, or fever as directed by your caregiver.   Keep any follow-up appointments in order to avoid any type of permanent shoulder disability or chronic pain problems.  SEEK MEDICAL CARE IF:   Pain in your shoulder increases or new pain develops in your arm, hand, or fingers.   Your hand or fingers are colder than your other hand.   You do not obtain pain relief with the medications or your pain becomes worse.  SEEK IMMEDIATE MEDICAL CARE IF:   Your arm, hand, or fingers are numb or tingling.   Your arm, hand, or fingers are swollen, painful, or turn white or blue.   You develop chest pain or shortness of breath.  MAKE SURE YOU:   Understand these instructions.   Will watch your condition.   Will get help right away if you are not doing well or get worse.    Document Released: 12/22/2004 Document Revised: 03/03/2011 Document Reviewed: 02/26/2011 [employer] Patient Information 2012 [employer]. 

## 2011-08-08 DIAGNOSIS — M25511 Pain in right shoulder: Secondary | ICD-10-CM | POA: Insufficient documentation

## 2011-08-08 NOTE — Assessment & Plan Note (Signed)
Will obtain xrays to assess for arthritic changes.  R shoulder injection performed.  After informed written consent, patient was seated on exam table. Time out performed. Right shoulder was prepped with betadine and alcohol swab Utilizing the posterior approach, patient's right glenohumeral space was injected with 4:1 kenalog: lidocaine. Patient tolerated the procedure well without immediate complications.  Red flags and overall procedure/treatment course were discussed. Handout given.

## 2011-09-13 ENCOUNTER — Other Ambulatory Visit: Payer: Self-pay | Admitting: *Deleted

## 2011-09-14 MED ORDER — CITALOPRAM HYDROBROMIDE 40 MG PO TABS: 40.0000 mg | ORAL_TABLET | Freq: Every day | ORAL | Status: DC

## 2011-09-14 MED ORDER — METFORMIN HCL 500 MG PO TABS: 500.0000 mg | ORAL_TABLET | Freq: Two times a day (BID) | ORAL | Status: DC

## 2011-09-14 MED ORDER — LOVASTATIN 20 MG PO TABS: 20.0000 mg | ORAL_TABLET | Freq: Every day | ORAL | Status: DC

## 2011-09-14 MED ORDER — FUROSEMIDE 20 MG PO TABS: 40.0000 mg | ORAL_TABLET | Freq: Every day | ORAL | Status: DC

## 2011-09-19 ENCOUNTER — Telehealth: Payer: Self-pay | Admitting: Family Medicine

## 2011-09-19 ENCOUNTER — Other Ambulatory Visit: Payer: Self-pay | Admitting: Family Medicine

## 2011-09-19 MED ORDER — LISINOPRIL 10 MG PO TABS: 10.0000 mg | ORAL_TABLET | Freq: Every day | ORAL | Status: DC

## 2011-09-19 MED ORDER — FUROSEMIDE 20 MG PO TABS: 40.0000 mg | ORAL_TABLET | Freq: Every day | ORAL | Status: DC

## 2011-09-19 NOTE — Telephone Encounter (Signed)
Filled BP meds

## 2011-09-19 NOTE — Telephone Encounter (Signed)
Left message on voicemail informing patient that meds had been sent in.

## 2011-09-19 NOTE — Telephone Encounter (Signed)
Pt is requesting refill of her BP meds be sent to [facility] in [place]. She has been out of these for about 2 months.Savannah Burns, [name]

## 2012-02-08 ENCOUNTER — Encounter: Payer: Self-pay | Admitting: Family Medicine

## 2012-02-08 ENCOUNTER — Ambulatory Visit (INDEPENDENT_AMBULATORY_CARE_PROVIDER_SITE_OTHER): Payer: Medicare Other | Admitting: Family Medicine

## 2012-02-08 VITALS — BP 154/76 | HR 70 | Temp 99.1°F | Ht 62.0 in | Wt 244.8 lb

## 2012-02-08 DIAGNOSIS — J209 Acute bronchitis, unspecified: Secondary | ICD-10-CM | POA: Insufficient documentation

## 2012-02-08 MED ORDER — AZITHROMYCIN 500 MG PO TABS: 500.0000 mg | ORAL_TABLET | Freq: Every day | ORAL | Status: DC

## 2012-02-08 MED ORDER — MOMETASONE FUROATE 50 MCG/ACT NA SUSP: 2.0000 | Freq: Every day | NASAL | Status: DC

## 2012-02-08 MED ORDER — BENZONATATE 100 MG PO CAPS: 100.0000 mg | ORAL_CAPSULE | Freq: Two times a day (BID) | ORAL | Status: DC | PRN

## 2012-02-08 NOTE — Progress Notes (Signed)
  Subjective:     Savannah Burns is a 61 y.o. female who presents for evaluation of sinus pain, dry cough, and hemoptysis.  Symptoms include: nasal congestion and post nasal drip. Patient coughed up blood-tinged sputum last Sunday (about 1.5 weeks ago) and went to [facility].  She was diagnosed with acute bronchitis and given Rx for Bactrim DS and Albuterol.  Patient did not pick up either of them because she was afraid it would affect her blood sugars.  Symptoms have been unchanged since that time.  Hemoptysis has resolved.  Denies any associated fever, chills, nausea/vomiting, diarrhea. Patient is a smoker  (1.5 ppd).  The following portions of the patient's history were reviewed and updated as appropriate: allergies, current medications and past medical history.  Review of Systems Pertinent items are noted in HPI.   Objective:    BP 154/76  Pulse 70  Temp 99.1 F (37.3 C) (Oral)  Ht 5' 2"  (1.575 m)  Wt 244 lb 12.8 oz (111.041 kg)  BMI 44.77 kg/m2  SpO2 98% General appearance: alert, cooperative and no distress Ears: normal TM's and external ear canals both ears Nose: moderate congestion Throat: lips, mucosa, and tongue normal; teeth and gums normal Lungs: clear to auscultation bilaterally Heart: regular rate and rhythm, S1, S2 normal, no murmur, click, rub or gallop    Assessment:    Acute bacterial sinusitis vs bronchitis.    Plan:    Nasal steroids per medication orders. Azithromycin per medication orders. Follow up in 1 week or as needed.

## 2012-02-08 NOTE — Patient Instructions (Addendum)
It was nice to meet you today. For your sinusitis, please pick up Nasonex and Azithromycin at your pharmacy and use as directed. For cough, start taking Tessalon perles as needed. You should start feeling better after 2-3 days on antibiotic. If your symptoms worsen, you start coughing up blood, develop fever, chills, nausea/vomiting, please return to clinic. Get plenty of rest and drink plenty of fluids.  Feel better soon.

## 2012-02-08 NOTE — Assessment & Plan Note (Signed)
Bronchitis likely in a heavy smoker, could also be acute sinusitis. - Azithromycin x 5 days - Nasal steroid and Tessalon perles for symptom relief - Counseled patient on smoking cessation - Follow up as needed if symptoms do not improve in 1-2 weeks

## 2012-05-07 ENCOUNTER — Other Ambulatory Visit: Payer: Self-pay | Admitting: *Deleted

## 2012-05-07 MED ORDER — LISINOPRIL 10 MG PO TABS: 10.0000 mg | ORAL_TABLET | Freq: Every day | ORAL | Status: DC

## 2012-05-07 NOTE — Telephone Encounter (Signed)
Refilled BP medications for patient. Please advise patient that she needs to come in for f/u of BP as her BP was elevated at last appointment.

## 2012-05-07 NOTE — Telephone Encounter (Signed)
Pt informed and appt made. Savannah Burns, [name]

## 2012-05-30 ENCOUNTER — Ambulatory Visit (INDEPENDENT_AMBULATORY_CARE_PROVIDER_SITE_OTHER): Payer: Medicare PPO | Admitting: Family Medicine

## 2012-05-30 ENCOUNTER — Encounter: Payer: Self-pay | Admitting: Family Medicine

## 2012-05-30 VITALS — BP 148/79 | HR 79 | Temp 99.0°F | Ht 62.0 in | Wt 253.3 lb

## 2012-05-30 DIAGNOSIS — J329 Chronic sinusitis, unspecified: Secondary | ICD-10-CM

## 2012-05-30 DIAGNOSIS — I10 Essential (primary) hypertension: Secondary | ICD-10-CM

## 2012-05-30 DIAGNOSIS — E119 Type 2 diabetes mellitus without complications: Secondary | ICD-10-CM

## 2012-05-30 DIAGNOSIS — E785 Hyperlipidemia, unspecified: Secondary | ICD-10-CM

## 2012-05-30 DIAGNOSIS — F17200 Nicotine dependence, unspecified, uncomplicated: Secondary | ICD-10-CM

## 2012-05-30 LAB — POCT GLYCOSYLATED HEMOGLOBIN (HGB A1C): Hemoglobin A1C: 6.3

## 2012-05-30 MED ORDER — AMOXICILLIN-POT CLAVULANATE 875-125 MG PO TABS: 1.0000 | ORAL_TABLET | Freq: Two times a day (BID) | ORAL | Status: DC

## 2012-05-30 MED ORDER — HYDROCORTISONE-ACETIC ACID 1-2 % OT SOLN: 3.0000 [drp] | Freq: Two times a day (BID) | OTIC | Status: DC

## 2012-05-30 MED ORDER — LOVASTATIN 20 MG PO TABS: 20.0000 mg | ORAL_TABLET | Freq: Every day | ORAL | Status: DC

## 2012-05-30 MED ORDER — MELOXICAM 15 MG PO TABS: 15.0000 mg | ORAL_TABLET | Freq: Every day | ORAL | Status: DC

## 2012-05-30 MED ORDER — NICOTINE 14 MG/24HR TD PT24: 1.0000 | MEDICATED_PATCH | TRANSDERMAL | Status: DC

## 2012-05-30 MED ORDER — LISINOPRIL 10 MG PO TABS: 10.0000 mg | ORAL_TABLET | Freq: Every day | ORAL | Status: DC

## 2012-05-30 MED ORDER — METFORMIN HCL 500 MG PO TABS: 500.0000 mg | ORAL_TABLET | Freq: Two times a day (BID) | ORAL | Status: DC

## 2012-05-30 NOTE — Patient Instructions (Signed)
Nice to meet you today. I'm glad you've decided to quit smoking. Please try the patches for this. I will see you back in one month for follow-up on this. I gave you a prescription for mobic, a strong antiinflammatory, for pain. Try this over the next month. Thanks for your visit today.

## 2012-05-31 NOTE — Progress Notes (Signed)
  Subjective:    Patient ID: Savannah Burns, female    DOB: 1950/09/19, 62 y.o.   MRN: [id number]  HPI patient is a 62 yo female who presents for yearly f/u and sinus issues.  HTN: taking lisinopril daily. Denies CP, though complains of SOB following coughing. Denies HA. Not checking at home.  DM: taking metformin 500 mg BID. Checking sugars at home. States is going to start walking and is going to work on diet by cutting out carbs and fried foods. Previously lost 70 lbs, though states has gained this back.  HLD: patient taking simvastatin. No complaints.  Smoking: states would like to quit. Previously quite though started back following death of husband. Smokes 1 ppd. Wants to try the patch.  Sinus issues: for one month has been congested with stuff draining down back of throat. Blowing stuff out nose. Additionally states inside ears itching. Has not taken any medications for this.    Review of Systems see HPI     Objective:   Physical Exam  Constitutional: She appears well-developed and well-nourished.  HENT:  Head: Normocephalic and atraumatic.  Right Ear: External ear normal.  Left Ear: External ear normal.  Mouth/Throat: Oropharynx is clear and moist.  Bilateral TMs without fluid, no tenderness over sinuses  Cardiovascular: Normal rate, regular rhythm and normal heart sounds.  Exam reveals no gallop and no friction rub.   No murmur heard. Pulmonary/Chest: Effort normal and breath sounds normal. She has no wheezes.  Musculoskeletal: She exhibits no edema.  Neurological: She is alert.  Sensation to light touch intact in bilateral feet  Skin: Skin is warm and dry.  BP 148/79  Pulse 79  Temp(Src) 99 F (37.2 C) (Oral)  Ht 5' 2"  (1.575 m)  Wt 253 lb 4.8 oz (114.896 kg)  BMI 46.32 kg/m2    Assessment & Plan:

## 2012-06-02 NOTE — Assessment & Plan Note (Signed)
A1c at goal, 6.3. Plan: will continue metformin 500 mg BID

## 2012-06-02 NOTE — Assessment & Plan Note (Signed)
BP at goal for >63 yo. Suspect that occasional SOB is related to smoking history and COPD. Will need to consider PFTs in the future with addition of COPD medication for this. Plan: continue lisinopril 10 mg daily

## 2012-06-02 NOTE — Assessment & Plan Note (Addendum)
Patient expresses desire to quit. Discussed options and patient stated she would like to try the patch. Does not want chantix. Plan: prescribed patch. Will f/u in 4 weeks to check on progress. If having difficulty will refer for further counseling and management.

## 2012-06-02 NOTE — Assessment & Plan Note (Signed)
Given prescription for augmentin for potential bacterial sinus infection. Additionally vosol-hc for ear itching. If sinus issues continue will need to discuss stopping nasal sprays with patient.

## 2012-06-02 NOTE — Assessment & Plan Note (Signed)
Will plan to check lipids at next visit. Continue simvastatin.

## 2012-09-26 ENCOUNTER — Ambulatory Visit (INDEPENDENT_AMBULATORY_CARE_PROVIDER_SITE_OTHER): Payer: Medicare PPO | Admitting: Family Medicine

## 2012-09-26 ENCOUNTER — Ambulatory Visit (HOSPITAL_COMMUNITY)
Admission: RE | Admit: 2012-09-26 | Discharge: 2012-09-26 | Disposition: A | Discharge status: Home / Self Care | Payer: Medicare PPO | Source: Ambulatory Visit | Attending: Family Medicine | Admitting: Family Medicine

## 2012-09-26 ENCOUNTER — Encounter: Payer: Self-pay | Admitting: Family Medicine

## 2012-09-26 VITALS — BP 120/76 | HR 81 | Temp 98.6°F | Ht 62.0 in | Wt 263.0 lb

## 2012-09-26 DIAGNOSIS — I10 Essential (primary) hypertension: Secondary | ICD-10-CM | POA: Insufficient documentation

## 2012-09-26 DIAGNOSIS — R079 Chest pain, unspecified: Secondary | ICD-10-CM

## 2012-09-26 DIAGNOSIS — E785 Hyperlipidemia, unspecified: Secondary | ICD-10-CM | POA: Insufficient documentation

## 2012-09-26 DIAGNOSIS — R0789 Other chest pain: Secondary | ICD-10-CM | POA: Insufficient documentation

## 2012-09-26 DIAGNOSIS — E119 Type 2 diabetes mellitus without complications: Secondary | ICD-10-CM | POA: Insufficient documentation

## 2012-09-26 NOTE — Assessment & Plan Note (Signed)
Patient with chest pain x3 episodes. Atypical sounding with sharp, non-radiating, non-substernal, non-exertional. EKG with no ischemic changes. BP equal in both arms and VSS making aortic dissection less likely. Likely related to MSK cause vs esophageal spasm vs GERD. Discussed with patient that this does not sound like a cardiac cause of pain given the above atypical reasons and are reassured by the EKG. Given her co-morbidities of HTN, DM, and HLD there is some concern for future cardiac events. All of these issues are under good control. Discussed with the patient the possibility of seeing a cardiologist for stress test and she preferred to watch for recurrence. At this time we will start on ASA 81 mg daily and continue to monitor for recurrence. Can use ibuprofen for pain in the future. Given red flags for return of care, see AVS.

## 2012-09-26 NOTE — Progress Notes (Signed)
  Subjective:    Patient ID: Savannah Burns, female    DOB: [birth year], 62 y.o.   MRN: [id number]  HPI  CHEST PAIN  Location: center of chest just to right of sternum Quality: sharp like someone squeezing, 15/10 Duration: 10-15 minutes Onset (rest, exertion): rest, once after eating, once while standing in shower Radiation: none Better with: standing there Worse with: nothing  Symptoms History of Trauma/lifting: no  Nausea/vomiting: no  Diaphoresis: yes  Shortness of breath: no  Pleuritic: no  Cough: yes  Edema: yes  Dizziness: no  Palpitations: no  Syncope: no  Indigestion: no   Red Flags Worse with exertion: no  Recent Immobility: no  Tearing/radiation to back: no   TIMI score 26 places in low risk category  EKG: NSR, no ST or T wave changes  PMH: continues to smoke, is going to try the patch  Review of Systems see HPI     Objective:   Physical Exam  Constitutional: She appears well-developed and well-nourished.  Cardiovascular: Normal rate, regular rhythm and normal heart sounds.  Exam reveals no gallop and no friction rub.   No murmur heard. Pulmonary/Chest: Effort normal and breath sounds normal. She has no wheezes. She has no rales.  Musculoskeletal: She exhibits no tenderness (no tenderness of anterior chest wall).  LE with now tenderness, asymmetric swelling, or cords  BP 124/74  Pulse 81  Temp(Src) 98.6 F (37 C) (Oral)  Ht 5' 2"  (1.575 m)  Wt 263 lb (119.296 kg)  BMI 48.09 kg/m2  SpO2 95%   BP R 112/78 BP L 120/76 Assessment & Plan:

## 2012-09-26 NOTE — Patient Instructions (Signed)
Nice to see you again. We think this chest pain is most likely related to your muscles or bones or esophagus. Though given that you have diabetes, high blood pressure, and elevated cholesterol there is some concern for that it may be due to your heart. We will continue to watch this and if it recurs will get you in to see a cardiologist.  Chest Pain (Nonspecific) It is often hard to give a specific diagnosis for the cause of chest pain. There is always a chance that your pain could be related to something serious, such as a heart attack or a blood clot in the lungs. You need to follow up with your caregiver for further evaluation. CAUSES   Heartburn.  Pneumonia or bronchitis.  Anxiety or stress.  Inflammation around your heart (pericarditis) or lung (pleuritis or pleurisy).  A blood clot in the lung.  A collapsed lung (pneumothorax). It can develop suddenly on its own (spontaneous pneumothorax) or from injury (trauma) to the chest.  Shingles infection (herpes zoster virus). The chest wall is composed of bones, muscles, and cartilage. Any of these can be the source of the pain.  The bones can be bruised by injury.  The muscles or cartilage can be strained by coughing or overwork.  The cartilage can be affected by inflammation and become sore (costochondritis). DIAGNOSIS  Lab tests or other studies, such as X-rays, electrocardiography, stress testing, or cardiac imaging, may be needed to find the cause of your pain.  TREATMENT   Treatment depends on what may be causing your chest pain. Treatment may include:  Acid blockers for heartburn.  Anti-inflammatory medicine.  Pain medicine for inflammatory conditions.  Antibiotics if an infection is present.  You may be advised to change lifestyle habits. This includes stopping smoking and avoiding alcohol, caffeine, and chocolate.  You may be advised to keep your head raised (elevated) when sleeping. This reduces the chance of acid  going backward from your stomach into your esophagus.  Most of the time, nonspecific chest pain will improve within 2 to 3 days with rest and mild pain medicine. HOME CARE INSTRUCTIONS   If antibiotics were prescribed, take your antibiotics as directed. Finish them even if you start to feel better.  For the next few days, avoid physical activities that bring on chest pain. Continue physical activities as directed.  Do not smoke.  Avoid drinking alcohol.  Only take over-the-counter or prescription medicine for pain, discomfort, or fever as directed by your caregiver.  Follow your caregiver's suggestions for further testing if your chest pain does not go away.  Keep any follow-up appointments you made. If you do not go to an appointment, you could develop lasting (chronic) problems with pain. If there is any problem keeping an appointment, you must call to reschedule. SEEK MEDICAL CARE IF:   You think you are having problems from the medicine you are taking. Read your medicine instructions carefully.  Your chest pain does not go away, even after treatment.  You develop a rash with blisters on your chest. SEEK IMMEDIATE MEDICAL CARE IF:   You have increased chest pain or pain that spreads to your arm, neck, jaw, back, or abdomen.  You develop shortness of breath, an increasing cough, or you are coughing up blood.  You have severe back or abdominal pain, feel nauseous, or vomit.  You develop severe weakness, fainting, or chills.  You have a fever. THIS IS AN EMERGENCY. Do not wait to see if the pain  will go away. Get medical help at once. Call your local emergency services (911 in [place] Do not drive yourself to the hospital. MAKE SURE YOU:   Understand these instructions.  Will watch your condition.  Will get help right away if you are not doing well or get worse. Document Released: 12/22/2004 Document Revised: 06/06/2011 Document Reviewed: 10/18/2007 [facility] Patient  Information 2014 [place].

## 2012-09-27 ENCOUNTER — Other Ambulatory Visit: Payer: Self-pay | Admitting: Family Medicine

## 2012-09-27 NOTE — Telephone Encounter (Signed)
Will refill with enough to get to next appointment. Will discuss need for these medications at next appointment, scheduled 7/17.

## 2012-10-11 ENCOUNTER — Ambulatory Visit: Payer: Medicare PPO | Admitting: Family Medicine

## 2012-10-31 ENCOUNTER — Ambulatory Visit: Payer: Medicare PPO | Admitting: Family Medicine

## 2012-12-14 ENCOUNTER — Other Ambulatory Visit: Payer: Self-pay | Admitting: Family Medicine

## 2012-12-14 NOTE — Telephone Encounter (Signed)
Patient needs to be seen to discuss this. I refilled this in early July for 30 pills. She is supposed to take one daily and has not requested a refill on this until now. If she is not taking it as prescribed she should bee seen prior to refill being given.

## 2012-12-14 NOTE — Telephone Encounter (Signed)
Patient needs an appointment prior to this medication being filled. I do not see a problem on her problem list that warrants refill without appointment.

## 2012-12-17 NOTE — Telephone Encounter (Signed)
LMOVM for return call. Fleeger, [name]

## 2013-05-14 ENCOUNTER — Other Ambulatory Visit: Payer: Self-pay | Admitting: Family Medicine

## 2013-05-15 NOTE — Telephone Encounter (Signed)
Patient needs to come in for an office visit for this refill. It appears she has not had this medication since July of last year. Please inform her of this.

## 2013-05-15 NOTE — Telephone Encounter (Signed)
Pt informed, but when I tried to schedule appt she states that she does not want to see Dr. Caryl Bis anymore.  Did not say why, but she has been out of her celexa for one week.  Spoke with Burna Forts, RN and she will speak with patient this afternoon or in the morning.   Pt agreeable with plan.Everardo Voris, [name]

## 2013-05-15 NOTE — Telephone Encounter (Signed)
Patient should be advised that she needs to come in for a follow-up appointment. She will be given a one month refill and then will have to come in for a visit.

## 2013-05-17 NOTE — Telephone Encounter (Signed)
Patient calls again, was never contacted by [facility]. Advised patient that Tomasa Hosteller was out of the office until Tuesday and that she will most likely receive a call upon her return. Pt agreeable.

## 2013-05-29 ENCOUNTER — Telehealth: Payer: Self-pay | Admitting: *Deleted

## 2013-05-29 NOTE — Telephone Encounter (Signed)
Was notified last week that patient called to lodge a complaint.  When I spoke with the patient she requested a PCP change based on her last OV with him.  Her issues were as follows:  (1) was given a refill on her Lasix of 30 tablets which she takes twice a day - this is only 15 days worth, (2) requested a refill on Vicoden and was refused - was given Meloxicam instead.  Stated that she does not abuse Vicoden, one refill lasts her a year, (3) stated that PCP came into exam room, did not greet her, looked at her list of issues she had written down and told her that she could only pick one to discuss, (4) she showed him some bug bites on her abdomen that were getting infected and was told to set up another appointment for that issue.  Patient very frustrated and states that she cannot afford to schedule an OV for each individual problem that she has.  Apologized for her frustration and told her we would discuss this with Dr. Caryl Bis and get back with her next week.

## 2013-06-07 NOTE — Telephone Encounter (Signed)
I called the patient to apologize that I was not able to meet her needs as a physician. There was no answer, so I left a voicemail apologizing and advising that we were working on getting her a new PCP.

## 2013-06-13 ENCOUNTER — Ambulatory Visit (INDEPENDENT_AMBULATORY_CARE_PROVIDER_SITE_OTHER): Payer: Medicare PPO | Admitting: Family Medicine

## 2013-06-13 ENCOUNTER — Encounter: Payer: Self-pay | Admitting: Family Medicine

## 2013-06-13 VITALS — BP 139/79 | HR 76 | Temp 98.8°F | Wt 264.0 lb

## 2013-06-13 DIAGNOSIS — M549 Dorsalgia, unspecified: Secondary | ICD-10-CM

## 2013-06-13 DIAGNOSIS — E785 Hyperlipidemia, unspecified: Secondary | ICD-10-CM

## 2013-06-13 DIAGNOSIS — I10 Essential (primary) hypertension: Secondary | ICD-10-CM

## 2013-06-13 DIAGNOSIS — F17200 Nicotine dependence, unspecified, uncomplicated: Secondary | ICD-10-CM

## 2013-06-13 DIAGNOSIS — E119 Type 2 diabetes mellitus without complications: Secondary | ICD-10-CM

## 2013-06-13 LAB — COMPREHENSIVE METABOLIC PANEL
ALBUMIN: 3.6 g/dL (ref 3.5–5.2)
ALT: 10 U/L (ref 0–35)
AST: 15 U/L (ref 0–37)
Alkaline Phosphatase: 100 U/L (ref 39–117)
BUN: 12 mg/dL (ref 6–23)
CHLORIDE: 102 meq/L (ref 96–112)
CO2: 26 meq/L (ref 19–32)
Calcium: 8.6 mg/dL (ref 8.4–10.5)
Creat: 0.67 mg/dL (ref 0.50–1.10)
GLUCOSE: 85 mg/dL (ref 70–99)
POTASSIUM: 4.3 meq/L (ref 3.5–5.3)
SODIUM: 137 meq/L (ref 135–145)
TOTAL PROTEIN: 7.3 g/dL (ref 6.0–8.3)
Total Bilirubin: 0.4 mg/dL (ref 0.2–1.2)

## 2013-06-13 LAB — CBC
HCT: 41.8 % (ref 36.0–46.0)
Hemoglobin: 14.1 g/dL (ref 12.0–15.0)
MCH: 31.3 pg (ref 26.0–34.0)
MCHC: 33.7 g/dL (ref 30.0–36.0)
MCV: 92.7 fL (ref 78.0–100.0)
PLATELETS: 201 10*3/uL (ref 150–400)
RBC: 4.51 MIL/uL (ref 3.87–5.11)
RDW: 14.1 % (ref 11.5–15.5)
WBC: 7.5 10*3/uL (ref 4.0–10.5)

## 2013-06-13 LAB — LIPID PANEL
CHOL/HDL RATIO: 5.3 ratio
CHOLESTEROL: 165 mg/dL (ref 0–200)
HDL: 31 mg/dL — AB (ref >39)
LDL Cholesterol: 99 mg/dL (ref 0–99)
Triglycerides: 176 mg/dL — ABNORMAL HIGH (ref <150)
VLDL: 35 mg/dL (ref 0–40)

## 2013-06-13 LAB — POCT GLYCOSYLATED HEMOGLOBIN (HGB A1C): Hemoglobin A1C: 6.1

## 2013-06-13 MED ORDER — NICOTINE 14 MG/24HR TD PT24: 14.0000 mg | MEDICATED_PATCH | TRANSDERMAL | Status: DC

## 2013-06-13 MED ORDER — FUROSEMIDE 20 MG PO TABS: ORAL_TABLET | ORAL | Status: DC

## 2013-06-13 MED ORDER — LOVASTATIN 20 MG PO TABS: 20.0000 mg | ORAL_TABLET | Freq: Every day | ORAL | Status: DC

## 2013-06-13 MED ORDER — METFORMIN HCL 500 MG PO TABS: 500.0000 mg | ORAL_TABLET | Freq: Two times a day (BID) | ORAL | Status: DC

## 2013-06-13 MED ORDER — LISINOPRIL 10 MG PO TABS: ORAL_TABLET | ORAL | Status: DC

## 2013-06-13 MED ORDER — CITALOPRAM HYDROBROMIDE 40 MG PO TABS: ORAL_TABLET | ORAL | Status: DC

## 2013-06-13 NOTE — Assessment & Plan Note (Signed)
BP at goal. Labs today - Con't Lisinopril and Lasix.  - Refills sent. - F/u 1 month.

## 2013-06-13 NOTE — Assessment & Plan Note (Signed)
Labs today. - Con't Metformin for now - F/u in 3 months.

## 2013-06-13 NOTE — Assessment & Plan Note (Signed)
Discussed with patient why this clinic does not prescribe chronic narcotic therapy. She is understandable and states she can do without Vicodin.  - Con't ibuprofen and Tylenol - Given back exercises to do - Heat to area - Referral to ortho given h/o back surgery - Making her depression worse, increase Celexa and f/u in 4 weeks.

## 2013-06-13 NOTE — Progress Notes (Signed)
Patient ID: Savannah Burns, female   DOB: Aug 19, 1950, 63 y.o.   MRN: [id number]    Subjective: HPI: Patient is a 63 y.o. female presenting to clinic today for follow up appointment. Concerns today include medication refills  1. Smoking- Daily smoker ready to quit. Would like to try patches  2. 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 Had back surgery 10 years ago. She was getting Vicodin #30 and made it last many months. Recently, she was lifting heavy items again today and she is having severe pain. No fevers, loss of bowel or bladder, no saddle anesthesia. She does have pain into butt. Interferes with daily life since she does not want to do anything, and this has resulted in weight gain and depression.  3. Diabetes:  Taking medications: Metformin Side effects: None ROS: denies fever, chills, dizziness, LOC, polyuria, polydipsia, numbness or tingling in extremities or chest pain. Nephropathy screen indicated?: Due for labs, but on ACE Needs eye exam and foot exam, discuss at next visit  4. Hypertension Blood pressure today: 139/79 Taking Meds: Lisinopril Side effects: None ROS: Denies headache, visual changes, nausea, vomiting, chest pain, abdominal pain or shortness of breath.  History Reviewed: Daily smoker. Health Maintenance: Needs updating at next visit  ROS: Please see HPI above.  Objective: Office vital signs reviewed. BP 139/79  Pulse 76  Temp(Src) 98.8 F (37.1 C) (Oral)  Wt 264 lb (119.75 kg)  Physical Examination:  General: Awake, alert. NAD HEENT: Atraumatic, normocephalic. MMM. Pulm: CTAB, faint expiratory wheezes Cardio: RRR, no murmurs appreciated Abdomen:+BS, soft, nontender, nondistended Back: TTP lumbar spine with paraspinal spasm in lumbar area Extremities: No edema Neuro: Grossly intact  Assessment: 63 y.o. female follow up  Plan: See Problem List and After Visit Summary

## 2013-06-13 NOTE — Assessment & Plan Note (Signed)
Labs today. - Con't Mevacor until labs return, may need to increase dose based on results.  - Repeat 1 year.

## 2013-06-13 NOTE — Patient Instructions (Addendum)
I am sorry you are having so much back pain. I have referred you to the orthopedic doctor. We will call you with this appointment.  Try increasing your Celexa to one whole pill per day, and do the exercises below. I will see you back within the next month.  I will send all of your medications to the pharmacy today, so plan on picking them up tomorrow.  Take care! Call if you need me.  Laporchia Nakajima M. Shaily Librizzi, M.D.   Back Exercises Back exercises help treat and prevent back injuries. The goal is to increase your strength in your belly (abdominal) and back muscles. These exercises can also help with flexibility. Start these exercises when told by your doctor. HOME CARE Back exercises include: Pelvic Tilt.  Lie on your back with your knees bent. Tilt your pelvis until the lower part of your back is against the floor. Hold this position 5 to 10 sec. Repeat this exercise 5 to 10 times. Knee to Chest.  Pull 1 knee up against your chest and hold for 20 to 30 seconds. Repeat this with the other knee. This may be done with the other leg straight or bent, whichever feels better. Then, pull both knees up against your chest. Sit-Ups or Curl-Ups.  Bend your knees 90 degrees. Start with tilting your pelvis, and do a partial, slow sit-up. Only lift your upper half 30 to 45 degrees off the floor. Take at least 2 to 3 seonds for each sit-up. Do not do sit-ups with your knees out straight. If partial sit-ups are difficult, simply do the above but with only tightening your belly (abdominal) muscles and holding it as told. Hip-Lift.  Lie on your back with your knees flexed 90 degrees. Push down with your feet and shoulders as you raise your hips 2 inches off the floor. Hold for 10 seconds, repeat 5 to 10 times. Back Arches.  Lie on your stomach. Prop yourself up on bent elbows. Slowly press on your hands, causing an arch in your low back. Repeat 3 to 5 times. Shoulder-Lifts.  Lie face down with arms beside  your body. Keep hips and belly pressed to floor as you slowly lift your head and shoulders off the floor. Do not overdo your exercises. Be careful in the beginning. Exercises may cause you some mild back discomfort. If the pain lasts for more than 15 minutes, stop the exercises until you see your doctor. Improvement with exercise for back problems is slow.  Document Released: 04/16/2010 Document Revised: 06/06/2011 Document Reviewed: 01/13/2011 [facility] Patient Information 2014 [place], [place].

## 2013-06-14 ENCOUNTER — Encounter: Payer: Self-pay | Admitting: Family Medicine

## 2013-07-12 ENCOUNTER — Encounter: Payer: Self-pay | Admitting: Family Medicine

## 2013-07-12 ENCOUNTER — Ambulatory Visit (INDEPENDENT_AMBULATORY_CARE_PROVIDER_SITE_OTHER): Payer: Medicare PPO | Admitting: Family Medicine

## 2013-07-12 VITALS — BP 121/71 | HR 80 | Temp 98.5°F | Ht 62.0 in | Wt 260.0 lb

## 2013-07-12 DIAGNOSIS — F3289 Other specified depressive episodes: Secondary | ICD-10-CM

## 2013-07-12 DIAGNOSIS — F329 Major depressive disorder, single episode, unspecified: Secondary | ICD-10-CM

## 2013-07-12 DIAGNOSIS — M549 Dorsalgia, unspecified: Secondary | ICD-10-CM

## 2013-07-12 MED ORDER — BUPROPION HCL 75 MG PO TABS: 75.0000 mg | ORAL_TABLET | Freq: Two times a day (BID) | ORAL | Status: DC

## 2013-07-12 NOTE — Assessment & Plan Note (Signed)
A: Depression is worsening. Denies SI/HI but she would like additional help.  P: - Con't Celexa 22m - Add Wellbutrin 262m- F/u in 4 weeks, or sooner if needed - Consider mental health provider if not improving.

## 2013-07-12 NOTE — Progress Notes (Signed)
Patient ID: [name], female   DOB: Apr 15, 1950, 63 y.o.   MRN: [id number]    Subjective: HPI: Patient is a 63 y.o. female presenting to clinic today for follow up on back pain and depression.  1. Back pain- Stable. Saw MD at [facility]. Given Vicodin and asked to see neurosurgeon who performed her surgery. That appt is on May 6. Otherwise, no changes or concerns.  2. Depression- On Celexa. Increased to 38m without any obvious change in the last month. No SI/HI however her best friend was diagnosed with bone cancer which is making her depression worse.  History Reviewed: Daily smoker.  ROS: Please see HPI above.  Objective: Office vital signs reviewed. BP 121/71  Pulse 80  Temp(Src) 98.5 F (36.9 C) (Oral)  Ht 5' 2"  (1.575 m)  Wt 260 lb (117.935 kg)  BMI 47.54 kg/m2  Physical Examination:  General: Awake, alert. NAD. Interactive. HEENT: Atraumatic, normocephalic. MMM Pulm: CTAB, no wheezes Cardio: RRR, no murmurs appreciated Abdomen:soft, nontender Back: TTP lumbar spine Neuro: Strength grossly intact  Assessment: 63y.o. female follow up  Plan: See Problem List and After Visit Summary

## 2013-07-12 NOTE — Assessment & Plan Note (Signed)
A: Stable. Has appropriate resources in place for neuro appt. Doing well with Vicodin at this time, prescribed by ortho.  P: - No changes. - F/u with me after neuro or if anything changes

## 2013-07-12 NOTE — Patient Instructions (Signed)
It was good to see you today.  Try taking the Wellbutrin 24m twice daily.  I will see you back in a month, or sooner  If needed.  Loyal Holzheimer M. Deltha Bernales, M.D.

## 2013-08-01 ENCOUNTER — Other Ambulatory Visit: Payer: Self-pay | Admitting: Orthopaedic Surgery

## 2013-08-01 DIAGNOSIS — M545 Low back pain, unspecified: Secondary | ICD-10-CM

## 2013-08-12 ENCOUNTER — Ambulatory Visit (INDEPENDENT_AMBULATORY_CARE_PROVIDER_SITE_OTHER): Payer: Medicare PPO | Admitting: Family Medicine

## 2013-08-12 ENCOUNTER — Encounter: Payer: Self-pay | Admitting: Family Medicine

## 2013-08-12 ENCOUNTER — Ambulatory Visit
Admission: RE | Admit: 2013-08-12 | Discharge: 2013-08-12 | Disposition: A | Discharge status: Home / Self Care | Payer: Medicare PPO | Source: Ambulatory Visit | Attending: Orthopaedic Surgery | Admitting: Orthopaedic Surgery

## 2013-08-12 VITALS — BP 138/71 | HR 80 | Temp 98.4°F | Ht 62.0 in | Wt 263.0 lb

## 2013-08-12 DIAGNOSIS — F329 Major depressive disorder, single episode, unspecified: Secondary | ICD-10-CM

## 2013-08-12 DIAGNOSIS — L600 Ingrowing nail: Secondary | ICD-10-CM

## 2013-08-12 DIAGNOSIS — M545 Low back pain, unspecified: Secondary | ICD-10-CM

## 2013-08-12 DIAGNOSIS — F3289 Other specified depressive episodes: Secondary | ICD-10-CM

## 2013-08-12 DIAGNOSIS — J329 Chronic sinusitis, unspecified: Secondary | ICD-10-CM

## 2013-08-12 DIAGNOSIS — M549 Dorsalgia, unspecified: Secondary | ICD-10-CM

## 2013-08-12 MED ORDER — HYDROCORTISONE-ACETIC ACID 1-2 % OT SOLN: 3.0000 [drp] | Freq: Two times a day (BID) | OTIC | Status: DC

## 2013-08-12 NOTE — Assessment & Plan Note (Signed)
A: Recurrent ingrowing of toenails. No S/Sx of infection.  P: - Refer to podiatry - F/u prn

## 2013-08-12 NOTE — Patient Instructions (Signed)
I will place a referral for the foot doctor, we will call you with that appointment.  I am happy the Wellbutrin is working well for you. I will see you back sometime near the end of June.   Please have Dr. Patrice Paradise send me his office notes/MRI.  Thank you! Amber M. Hairford, M.D.

## 2013-08-12 NOTE — Assessment & Plan Note (Signed)
A: Improved on Wellbutrin. Mood seems improved today. No SI/HI  P: - Con't wellbutrin and celexa - F/u in 4-6 weeks

## 2013-08-12 NOTE — Progress Notes (Signed)
Patient ID: Savannah Burns, female   DOB: 1950/04/17, 63 y.o.   MRN: [id number]    Subjective: HPI: Patient is a 63 y.o. female presenting to clinic today for follow up appointment for depression.  1. Depression- Has been on Celexa 65m, added Wellbutrin BID at last visit. Doing well. Not sleeping as much. Increased motivation. Stress is about the same. No SI/HI.  2. Back pain- Followed by Neurosurgen (Dr. CPatrice Paradise, has MRI today. On Vicodin from him and doing well right now. Takes 1/2-1 tab per day to stay functional.   3. Left toenail pain- Has history of multiple ingrown toenails. Had phenol destruction of 2nd toenails, would like to go back to Podiatry to be re-evaluated. Currently painful without redness or s/sx of infgection.   History Reviewed: Daily smoker, 1ppd. Health Maintenance: Mammogram- Does not take mammograms, Colonoscopy- Does not want. Needs Tdap and pap smear.   ROS: Please see HPI above.  Objective: Office vital signs reviewed. BP 138/71  Pulse 80  Temp(Src) 98.4 F (36.9 C) (Oral)  Ht 5' 2"  (1.575 m)  Wt 263 lb (119.296 kg)  BMI 48.09 kg/m2  Physical Examination:  General: Awake, alert. NAD. Happy and smiling HEENT: Atraumatic, normocephalic, MMM Pulm: CTAB, no wheezes. Good effort Cardio: RRR, no murmurs appreciated Abdomen:+BS, soft, nontender, nondistended Extremities: No edema. Thickening of both great toenails, L>R with medial edge slightly more rounded. No erythema appreciated Neuro: Grossly intact, walks without assistance. Overall, good mobility.  Assessment: 63y.o. female follow up  Plan: See Problem List and After Visit Summary

## 2013-08-12 NOTE — Assessment & Plan Note (Signed)
A: Chronic back pain. No new changes. Followed by neurosurgery.  P: - MRI today - Pain medication per Dr. Patrice Paradise

## 2013-09-16 ENCOUNTER — Ambulatory Visit (INDEPENDENT_AMBULATORY_CARE_PROVIDER_SITE_OTHER): Payer: Medicare PPO | Admitting: Family Medicine

## 2013-09-16 ENCOUNTER — Encounter: Payer: Self-pay | Admitting: Family Medicine

## 2013-09-16 VITALS — BP 122/71 | HR 80 | Temp 98.4°F | Wt 257.0 lb

## 2013-09-16 DIAGNOSIS — F329 Major depressive disorder, single episode, unspecified: Secondary | ICD-10-CM

## 2013-09-16 DIAGNOSIS — M545 Low back pain, unspecified: Secondary | ICD-10-CM

## 2013-09-16 DIAGNOSIS — E669 Obesity, unspecified: Secondary | ICD-10-CM

## 2013-09-16 DIAGNOSIS — F3289 Other specified depressive episodes: Secondary | ICD-10-CM

## 2013-09-16 DIAGNOSIS — E119 Type 2 diabetes mellitus without complications: Secondary | ICD-10-CM

## 2013-09-16 LAB — POCT GLYCOSYLATED HEMOGLOBIN (HGB A1C): Hemoglobin A1C: 5.9

## 2013-09-16 MED ORDER — TETANUS-DIPHTH-ACELL PERTUSSIS 5-2.5-18.5 LF-MCG/0.5 IM SUSP: 0.5000 mL | Freq: Once | INTRAMUSCULAR | Status: DC

## 2013-09-16 NOTE — Assessment & Plan Note (Signed)
A: Patient has much improved on Celexa and Wellbutrin.   P: - Continue current regimen - F/u in 3 months, or sooner if needed

## 2013-09-16 NOTE — Assessment & Plan Note (Signed)
A: Stable.  P: - Con't to follow up with Dr. Patrice Paradise

## 2013-09-16 NOTE — Assessment & Plan Note (Signed)
A: Patient interested in weight loss surgery. She meets qualifications based on weight and comorbidities.  P: - STOP SMOKING - Try weight loss on her own - Attend education session - When she is ready to meet with a surgeon, we will place referral. Patient agrees.

## 2013-09-16 NOTE — Patient Instructions (Signed)
I have enjoyed being your doctor. Best wishes with everything!  I would strongly encourage you to stop smoking. You can look on the [facility] Education page for the weight loss surgery information session at [facility].  Please follow up in 3 months.  Amber M. Hairford, M.D.

## 2013-09-16 NOTE — Assessment & Plan Note (Signed)
A: A1c at goal.  P: - Con't metformin - Encouraged to diet and lose weight

## 2013-09-16 NOTE — Progress Notes (Signed)
Patient ID: [name], female   DOB: 14-May-1950, 63 y.o.   MRN: [id number]    Subjective: HPI: Patient is a 63 y.o. female presenting to clinic today for follow up appointment. Concerns today include back pain  1. Back pain- Followed by neurosurgery. Stable. Doing PT and considering ESI.  2. DM- A1C improved. Feeling well. No problems with medications. No symptomatic lows.  3. Depression- States she likes the medications she is currently on and would like to continue at current dose. Mood stabilized. No SI/HI.  4. Weight loss- Patient interested in weight loss surgery once her back pain improves. At this point, I have directed her to stop smoking and consider going to the information session.  History Reviewed: Daily smoker. Health Maintenance: Declines all health maintenance except Tdap. Rx printed  ROS: Please see HPI above.  Objective: Office vital signs reviewed. BP 122/71  Pulse 80  Temp(Src) 98.4 F (36.9 C) (Oral)  Wt 257 lb (116.574 kg)  Physical Examination:  General: Awake, alert. NAD HEENT: Atraumatic, normocephalic Pulm: CTAB, no wheezes Cardio: RRR, no murmurs appreciated Abdomen: Obese, +BS, soft, nontender, nondistended Extremities: No edema, but has diffuse red appearance of lower extremities Neuro: Grossly intact  Assessment: 63 y.o. female Follow up  Plan: See Problem List and After Visit Summary

## 2013-09-19 ENCOUNTER — Encounter: Payer: Self-pay | Admitting: Podiatry

## 2013-09-19 ENCOUNTER — Ambulatory Visit (INDEPENDENT_AMBULATORY_CARE_PROVIDER_SITE_OTHER): Payer: Medicare PPO | Admitting: Podiatry

## 2013-09-19 VITALS — Ht 62.0 in | Wt 260.0 lb

## 2013-09-19 DIAGNOSIS — L600 Ingrowing nail: Secondary | ICD-10-CM

## 2013-09-19 NOTE — Patient Instructions (Addendum)

## 2013-09-19 NOTE — Progress Notes (Signed)
Subjective:     Patient ID: Savannah Burns, female   DOB: May 17, 1950, 63 y.o.   MRN: [id number]  HPI patient presents stating I have bad toenails that become very sore my big toenails left over right and I have spicules on my second nail left I cannot get out after having had removed a number of years ago. States the pain is getting worse and she cannot take care of herself or get the ingrown out   Review of Systems  All other systems reviewed and are negative.      Objective:   Physical Exam  Nursing note and vitals reviewed. Constitutional: She is oriented to person, place, and time.  Cardiovascular: Intact distal pulses.   Musculoskeletal: Normal range of motion.  Neurological: She is oriented to person, place, and time.  Skin: Skin is warm and dry.   neurovascular status found to be intact with range of motion of the subtalar and midtarsal joint found to be adequate and muscle strength within normal limits. Patient's found to have an incurvated thick nail left big toe that's painful and also right not quite to the same degree and is noted to have a the medial border of the left second nail that is tender. Digits are found to be well perfused and arch height is within normal limits     Assessment:     Ingrown toenail deformity hallux bilateral left worse and second left with pain    Plan:     H&P and condition discussed with patient. I went ahead today I recommended removal of the nail and I did explain the risk of doing this and healing. She wants procedure we'll focus on the left foot first and then the right one to be done after this heels. Today I infiltrated the left hallux and second toes with 120 mg of Xylocaine Marcaine mixture remove the hallux second nail spicule medial side and apply chemical phenol 3 applications 30 seconds followed by alcohol lavage and sterile dressing. I then removed the left hallux nail exposed matrix and apply chemical 5 applications followed by  alcohol lavaged and sterile dressing. Reappoint for correction of the right foot in about 6 weeks

## 2013-09-19 NOTE — Progress Notes (Signed)
   Subjective:    Patient ID: Savannah Burns, female    DOB: May 16, 1950, 63 y.o.   MRN: [id number]  HPI Comments: Pt complains of painful, encurvated toenails B/L 1, 3, 4, 5, toenails, and B/L 2nd toenail spicule.     Review of Systems  HENT: Positive for hearing loss and sinus pressure.   Cardiovascular: Positive for leg swelling.  Endocrine: Positive for polydipsia.  Musculoskeletal: Positive for back pain.  Allergic/Immunologic: Positive for environmental allergies.  All other systems reviewed and are negative.      Objective:   Physical Exam        Assessment & Plan:

## 2013-11-07 ENCOUNTER — Ambulatory Visit (INDEPENDENT_AMBULATORY_CARE_PROVIDER_SITE_OTHER): Payer: Medicare PPO | Admitting: Podiatry

## 2013-11-07 VITALS — BP 138/72 | HR 74 | Resp 16

## 2013-11-07 DIAGNOSIS — L600 Ingrowing nail: Secondary | ICD-10-CM

## 2013-11-07 DIAGNOSIS — B351 Tinea unguium: Secondary | ICD-10-CM

## 2013-11-07 NOTE — Patient Instructions (Signed)

## 2013-11-07 NOTE — Progress Notes (Signed)
Subjective:     Patient ID: Savannah Burns, female   DOB: 1950-05-13, 63 y.o.   MRN: [id number]  HPI patient states my nails are doing well and I want to get the big one in the root of my second nail right taken care of as they become painful thick and I cannot cut   Review of Systems     Objective:   Physical Exam Neurovascular status intact no health history changes with a thickened deformed right hallux nail and a painful lateral border of the right second nail    Assessment:     Damaged nailbed right big toe and right second nail lateral border    Plan:     Reviewed correction and risk and patient wants procedure. Injected with 120 mg of Xylocaine Marcaine mixture remove the hallux nail right total and apply chemical consisting of phenol 5 applications 30 seconds followed by alcohol lavaged and sterile dressing and remove the lateral border of the right second nail applying chemical of 3 applications phenol 30 seconds followed by alcohol lavaged and sterile dressing instructed on soaks and reappoint

## 2013-11-07 NOTE — Progress Notes (Signed)
   Subjective:    Patient ID: Savannah Burns, female    DOB: 02-28-1951, 63 y.o.   MRN: [id number]  HPI Pt presents for followup ingrown nail removal left great toe. No drainage or s/s of infection, small dark spot in corner of nail   Review of Systems     Objective:   Physical Exam        Assessment & Plan:

## 2014-02-13 ENCOUNTER — Ambulatory Visit: Payer: Medicare PPO | Admitting: Podiatry

## 2014-04-22 ENCOUNTER — Other Ambulatory Visit: Payer: Self-pay | Admitting: *Deleted

## 2014-04-23 MED ORDER — FUROSEMIDE 20 MG PO TABS: ORAL_TABLET | ORAL | Status: DC

## 2014-04-29 ENCOUNTER — Encounter: Payer: Self-pay | Admitting: Obstetrics and Gynecology

## 2014-04-30 DIAGNOSIS — M545 Low back pain: Secondary | ICD-10-CM | POA: Diagnosis not present

## 2014-06-23 ENCOUNTER — Other Ambulatory Visit: Payer: Self-pay | Admitting: *Deleted

## 2014-06-23 MED ORDER — LOVASTATIN 20 MG PO TABS: 20.0000 mg | ORAL_TABLET | Freq: Every day | ORAL | Status: DC

## 2014-06-23 MED ORDER — LISINOPRIL 10 MG PO TABS: ORAL_TABLET | ORAL | Status: DC

## 2014-06-23 MED ORDER — METFORMIN HCL 500 MG PO TABS: 500.0000 mg | ORAL_TABLET | Freq: Two times a day (BID) | ORAL | Status: DC

## 2014-06-27 ENCOUNTER — Encounter: Payer: Self-pay | Admitting: Obstetrics and Gynecology

## 2014-06-27 ENCOUNTER — Ambulatory Visit (INDEPENDENT_AMBULATORY_CARE_PROVIDER_SITE_OTHER): Payer: Medicare PPO | Admitting: Obstetrics and Gynecology

## 2014-06-27 VITALS — BP 136/78 | HR 72 | Temp 98.3°F | Ht 62.0 in | Wt 259.0 lb

## 2014-06-27 DIAGNOSIS — F17200 Nicotine dependence, unspecified, uncomplicated: Secondary | ICD-10-CM

## 2014-06-27 DIAGNOSIS — I10 Essential (primary) hypertension: Secondary | ICD-10-CM

## 2014-06-27 DIAGNOSIS — E119 Type 2 diabetes mellitus without complications: Secondary | ICD-10-CM

## 2014-06-27 DIAGNOSIS — R911 Solitary pulmonary nodule: Secondary | ICD-10-CM

## 2014-06-27 DIAGNOSIS — Z0000 Encounter for general adult medical examination without abnormal findings: Secondary | ICD-10-CM

## 2014-06-27 DIAGNOSIS — Z720 Tobacco use: Secondary | ICD-10-CM | POA: Diagnosis not present

## 2014-06-27 DIAGNOSIS — E785 Hyperlipidemia, unspecified: Secondary | ICD-10-CM

## 2014-06-27 DIAGNOSIS — J302 Other seasonal allergic rhinitis: Secondary | ICD-10-CM

## 2014-06-27 LAB — POCT GLYCOSYLATED HEMOGLOBIN (HGB A1C): HEMOGLOBIN A1C: 6.4

## 2014-06-27 MED ORDER — FLUTICASONE PROPIONATE 50 MCG/ACT NA SUSP: 2.0000 | Freq: Every day | NASAL | Status: DC

## 2014-06-27 NOTE — Assessment & Plan Note (Addendum)
A: A1c at goal. However it is increased from prior A1c of 5.9. P: Continue metformin at current dose. Encouraged patient to diet and lose weight. Follow-up in 3 months for A1c recheck. Urine microalbumin and diabetic foot exam performed today.

## 2014-06-27 NOTE — Assessment & Plan Note (Signed)
Patient with symptoms consistent with allergic rhinitis. Rx given for Flonase.

## 2014-06-27 NOTE — Addendum Note (Signed)
Addended by: [place] Pink on: 06/27/2014 02:58 PM   Modules accepted: Orders

## 2014-06-27 NOTE — Assessment & Plan Note (Signed)
BP at goal.  No need for labs today. Continue lisinopril and Lasix.

## 2014-06-27 NOTE — Assessment & Plan Note (Signed)
Labs today. Continue statin.

## 2014-06-27 NOTE — Assessment & Plan Note (Signed)
Patient adamantly declines mammogram and colonoscopy screening. States that she would rather die than have screening. However, patient is agreeable to low-dose CT scan to screen for lung cancer (pt has a >35 pack-year smoking history and currently smoking). Order lipid panel and urine microalbumin today. Patient encouraged to get eye exam.

## 2014-06-27 NOTE — Assessment & Plan Note (Addendum)
Currently smokes 1 pack per day. Patient has quit before; however after her husband passed she started smoking again. She is not ready to quit yet. States that she was then able to get patches last time due to insurance not covering cost. Ordered low dose CT scan to screen for lung cancer (pt has a >35 pack-year smoking history and currently smoking). When patient has decided to truly want to quit will help with counseling and management.

## 2014-06-27 NOTE — Assessment & Plan Note (Signed)
Patient with history of pulmonary nodule. Appears that there has been a loss of follow-up of pulmonary nodule. Will get low-dose CT scan to screen for lung cancer and also to follow-up on this nodule.

## 2014-06-27 NOTE — Progress Notes (Signed)
  Subjective:     Savannah Burns is a 64 y.o. female and is here for a comprehensive physical exam. The patient reports problems - cough.  Patient says that 3 days ago she had a sore throat and enlarged glands. Social had associated cough that was productive. She says that her nose feels stuffy and she has drainage in the back of her throat. She states that she is having a hard time breathing because she cannot breathe through her nose. Feels like sometimes she is gasping at night for breath. She has diffuse over-the-counter allergy tablets which have helped.  History   Social History  . Marital Status: Widowed    Spouse Name: N/A  . Number of Children: 2  . Years of Education: 14   Occupational History  . Not on file.   Social History Main Topics  . Smoking status: Current Every Day Smoker -- 1.00 packs/day for 40 years    Types: Cigarettes  . Smokeless tobacco: Not on file  . Alcohol Use: No  . Drug Use: No  . Sexual Activity: Not Currently   Other Topics Concern  . Not on file   Social History Narrative   Health Maintenance  Topic Date Due  . FOOT EXAM  09/13/1959  . OPHTHALMOLOGY EXAM  09/13/1959  . URINE MICROALBUMIN  09/13/1959  . HIV Screening  09/12/1964  . MAMMOGRAM  09/13/1999  . COLONOSCOPY  09/13/1999  . ZOSTAVAX  09/12/2009  . TETANUS/TDAP  05/26/2012  . HEMOGLOBIN A1C  03/18/2014  . INFLUENZA VACCINE  10/27/2014    The following portions of the patient's history were reviewed and updated as appropriate: allergies, current medications, past family history, past medical history, past social history, past surgical history and problem list.  Review of Systems Pertinent items are noted in HPI. Otherwise a comprehensive review of systmoms was negative.    Objective:    General appearance: alert, cooperative, no distress and moderately obese Head: Normocephalic, without obvious abnormality, atraumatic, sinuses nontender to percussion Eyes:  conjunctivae/corneas clear. PERRL, EOM's intact. Fundi benign. Throat: lips, mucosa, and tongue normal; teeth and gums normal Lungs: clear to auscultation bilaterally Heart: regular rate and rhythm, S1, S2 normal, no murmur, click, rub or gallop Abdomen: soft, non-tender; bowel sounds normal; no masses,  no organomegaly Extremities: edema present and varicose veins noted Pulses: 2+ and symmetric diminished pulses Skin: Skin color, texture, turgor normal. No rashes or lesions Neurologic: Alert and oriented X 3, normal strength and tone. Normal symmetric reflexes. Normal coordination and gait    Diabetic Foot Exam - Simple   Simple Foot Form  Diabetic Foot exam was performed with the following findings:  Yes 06/27/2014  9:21 AM  Visual Inspection  Sensation Testing  Pulse Check  Comments  Normal foot exam.      Assessment:     Savannah Burns is a 64 y.o. female with PMH of T2DM, HTN, depressive disorder, and COPD. Her chronic medical conditions are stable.      Plan:   Please see problem based Assessment and Plan   See After Visit Summary for Counseling Recommendations

## 2014-06-27 NOTE — Patient Instructions (Addendum)
Savannah Burns it was great to meet you today!  I am pleased to hear that things are going well for you.  Here are some of the things we discussed today: - I think your cough is coming from a URI vs allergies. I sent in a nasal spray for you - consider getting mammogram and colonoscopy; you declined today - you still need one more pap smear schedule appointment for that when you are ready - I ordered a lung cancer screening CT. Someone will call you about scheduling that.  New medications: -Flonase  Please schedule a follow-up appointment as needed.    Thanks for allowing me to be a part of your care! Dr. Gerarda Fraction   Nicotine Addiction Nicotine can act as both a stimulant (excites/activates) and a sedative (calms/quiets). Immediately after exposure to nicotine, there is a "kick" caused in part by the drug's stimulation of the adrenal glands and resulting discharge of adrenaline (epinephrine). The rush of adrenaline stimulates the body and causes a sudden release of sugar. This means that smokers are always slightly hyperglycemic. Hyperglycemic means that the blood sugar is high, just like in diabetics. Nicotine also decreases the amount of insulin which helps control sugar levels in the body. There is an increase in blood pressure, breathing, and the rate of heart beats.  In addition, nicotine indirectly causes a release of dopamine in the brain that controls pleasure and motivation. A similar reaction is seen with other drugs of abuse, such as cocaine and heroin. This dopamine release is thought to cause the pleasurable sensations when smoking. In some different cases, nicotine can also create a calming effect, depending on sensitivity of the smoker's nervous system and the dose of nicotine taken. WHAT HAPPENS WHEN NICOTINE IS TAKEN FOR LONG PERIODS OF TIME?  Long-term use of nicotine results in addiction. It is difficult to stop.  Repeated use of nicotine creates tolerance. Higher doses of  nicotine are needed to get the "kick." When nicotine use is stopped, withdrawal may last a month or more. Withdrawal may begin within a few hours after the last cigarette. Symptoms peak within the first few days and may lessen within a few weeks. For some people, however, symptoms may last for months or longer. Withdrawal symptoms include:   Irritability.  Craving.  Learning and attention deficits.  Sleep disturbances.  Increased appetite. Craving for tobacco may last for 6 months or longer. Many behaviors done while using nicotine can also play a part in the severity of withdrawal symptoms. For some people, the feel, smell, and sight of a cigarette and the ritual of obtaining, handling, lighting, and smoking the cigarette are closely linked with the pleasure of smoking. When stopped, they also miss the related behaviors which make the withdrawal or craving worse. While nicotine gum and patches may lessen the drug aspects of withdrawal, cravings often persist. WHAT ARE THE MEDICAL CONSEQUENCES OF NICOTINE USE?  Nicotine addiction accounts for one-third of all cancers. The top cancer caused by tobacco is lung cancer. Lung cancer is the number one cancer killer of both men and women.  Smoking is also associated with cancers of the:  Mouth.  Pharynx.  Larynx.  Esophagus.  Stomach.  Pancreas.  Cervix.  Kidney.  Ureter.  Bladder.  Smoking also causes lung diseases such as lasting (chronic) bronchitis and emphysema.  It worsens asthma in adults and children.  Smoking increases the risk of heart disease, including:  Stroke.  Heart attack.  Vascular disease.  Aneurysm.  Passive or secondary smoke can also increase medical risks including:  Asthma in children.  Sudden Infant Death Syndrome (SIDS).  Additionally, dropped cigarettes are the leading cause of residential fire fatalities.  Nicotine poisoning has been reported from accidental ingestion of tobacco  products by children and pets. Death usually results in a few minutes from respiratory failure (when a person stops breathing) caused by paralysis. TREATMENT   Medication. Nicotine replacement medicines such as nicotine gum and the patch are used to stop smoking. These medicines gradually lower the dosage of nicotine in the body. These medicines do not contain the carbon monoxide and other toxins found in tobacco smoke.  Hypnotherapy.  Relaxation therapy.  Nicotine Anonymous (a 12-step support program). Find times and locations in your local yellow pages. Document Released: 11/18/2003 Document Revised: 06/06/2011 Document Reviewed: 05/10/2013 [facility] Patient Information 2015 [place], [place]. This information is not intended to replace advice given to you by your health care provider. Make sure you discuss any questions you have with your health care provider.   Upper Respiratory Infection, Adult An upper respiratory infection (URI) is also sometimes known as the common cold. The upper respiratory tract includes the nose, sinuses, throat, trachea, and bronchi. Bronchi are the airways leading to the lungs. Most people improve within 1 week, but symptoms can last up to 2 weeks. A residual cough may last even longer.  CAUSES Many different viruses can infect the tissues lining the upper respiratory tract. The tissues become irritated and inflamed and often become very moist. Mucus production is also common. A cold is contagious. You can easily spread the virus to others by oral contact. This includes kissing, sharing a glass, coughing, or sneezing. Touching your mouth or nose and then touching a surface, which is then touched by another person, can also spread the virus. SYMPTOMS  Symptoms typically develop 1 to 3 days after you come in contact with a cold virus. Symptoms vary from person to person. They may include:  Runny nose.  Sneezing.  Nasal congestion.  Sinus irritation.  Sore  throat.  Loss of voice (laryngitis).  Cough.  Fatigue.  Muscle aches.  Loss of appetite.  Headache.  Low-grade fever. DIAGNOSIS  You might diagnose your own cold based on familiar symptoms, since most people get a cold 2 to 3 times a year. Your caregiver can confirm this based on your exam. Most importantly, your caregiver can check that your symptoms are not due to another disease such as strep throat, sinusitis, pneumonia, asthma, or epiglottitis. Blood tests, throat tests, and X-rays are not necessary to diagnose a common cold, but they may sometimes be helpful in excluding other more serious diseases. Your caregiver will decide if any further tests are required. RISKS AND COMPLICATIONS  You may be at risk for a more severe case of the common cold if you smoke cigarettes, have chronic heart disease (such as heart failure) or lung disease (such as asthma), or if you have a weakened immune system. The very young and very old are also at risk for more serious infections. Bacterial sinusitis, middle ear infections, and bacterial pneumonia can complicate the common cold. The common cold can worsen asthma and chronic obstructive pulmonary disease (COPD). Sometimes, these complications can require emergency medical care and may be life-threatening. PREVENTION  The best way to protect against getting a cold is to practice good hygiene. Avoid oral or hand contact with people with cold symptoms. Wash your hands often if contact occurs. There is no  clear evidence that vitamin C, vitamin E, echinacea, or exercise reduces the chance of developing a cold. However, it is always recommended to get plenty of rest and practice good nutrition. TREATMENT  Treatment is directed at relieving symptoms. There is no cure. Antibiotics are not effective, because the infection is caused by a virus, not by bacteria. Treatment may include:  Increased fluid intake. Sports drinks offer valuable electrolytes, sugars, and  fluids.  Breathing heated mist or steam (vaporizer or shower).  Eating chicken soup or other clear broths, and maintaining good nutrition.  Getting plenty of rest.  Using gargles or lozenges for comfort.  Controlling fevers with ibuprofen or acetaminophen as directed by your caregiver.  Increasing usage of your inhaler if you have asthma. Zinc gel and zinc lozenges, taken in the first 24 hours of the common cold, can shorten the duration and lessen the severity of symptoms. Pain medicines may help with fever, muscle aches, and throat pain. A variety of non-prescription medicines are available to treat congestion and runny nose. Your caregiver can make recommendations and may suggest nasal or lung inhalers for other symptoms.  HOME CARE INSTRUCTIONS   Only take over-the-counter or prescription medicines for pain, discomfort, or fever as directed by your caregiver.  Use a warm mist humidifier or inhale steam from a shower to increase air moisture. This may keep secretions moist and make it easier to breathe.  Drink enough water and fluids to keep your urine clear or pale yellow.  Rest as needed.  Return to work when your temperature has returned to normal or as your caregiver advises. You may need to stay home longer to avoid infecting others. You can also use a face mask and careful hand washing to prevent spread of the virus. SEEK MEDICAL CARE IF:   After the first few days, you feel you are getting worse rather than better.  You need your caregiver's advice about medicines to control symptoms.  You develop chills, worsening shortness of breath, or brown or red sputum. These may be signs of pneumonia.  You develop yellow or brown nasal discharge or pain in the face, especially when you bend forward. These may be signs of sinusitis.  You develop a fever, swollen neck glands, pain with swallowing, or white areas in the back of your throat. These may be signs of strep throat. SEEK  IMMEDIATE MEDICAL CARE IF:   You have a fever.  You develop severe or persistent headache, ear pain, sinus pain, or chest pain.  You develop wheezing, a prolonged cough, cough up blood, or have a change in your usual mucus (if you have chronic lung disease).  You develop sore muscles or a stiff neck. Document Released: 09/07/2000 Document Revised: 06/06/2011 Document Reviewed: 06/19/2013 [facility] Patient Information 2015 [place], [place]. This information is not intended to replace advice given to you by your health care provider. Make sure you discuss any questions you have with your health care provider.

## 2014-06-28 LAB — LIPID PANEL
Cholesterol: 167 mg/dL (ref 0–200)
HDL: 34 mg/dL — ABNORMAL LOW (ref >=46)
LDL Cholesterol: 108 mg/dL — ABNORMAL HIGH (ref 0–99)
Total CHOL/HDL Ratio: 4.9 Ratio
Triglycerides: 125 mg/dL (ref <150)
VLDL: 25 mg/dL (ref 0–40)

## 2014-06-28 LAB — MICROALBUMIN, URINE: MICROALB UR: 1.1 mg/dL (ref <2.0)

## 2014-06-30 ENCOUNTER — Encounter: Payer: Self-pay | Admitting: Obstetrics and Gynecology

## 2014-07-02 ENCOUNTER — Telehealth: Payer: Self-pay | Admitting: Obstetrics and Gynecology

## 2014-07-02 ENCOUNTER — Other Ambulatory Visit: Payer: Self-pay | Admitting: Obstetrics and Gynecology

## 2014-07-02 DIAGNOSIS — R911 Solitary pulmonary nodule: Secondary | ICD-10-CM

## 2014-07-02 NOTE — Telephone Encounter (Signed)
Needs authorization for Ct scan for [facility]

## 2014-07-02 NOTE — Telephone Encounter (Signed)
Will forward to MD to change this order. Daksha Koone,CMA

## 2014-07-02 NOTE — Telephone Encounter (Signed)
Spoke to [place]. We are going to change the order to a regular CT scan with contrast to follow-up on lung nodules. With the higher resolution CT scan this will definitely cover for screening for lung cancer.

## 2014-07-02 NOTE — Telephone Encounter (Signed)
Authorization received from [facility] and placed in order. Jazmin Hartsell,CMA

## 2014-07-02 NOTE — Telephone Encounter (Signed)
Dr. McDiarmid ordered a lung cancer screening for pt for tobacco abuse and lung nodules, has to be a CT test without contrast, cannot be a lung cancer screening. Please call [facility] Guiles when possible regarding this matter / Fonda Kinder, ASA

## 2014-07-03 ENCOUNTER — Ambulatory Visit
Admission: RE | Admit: 2014-07-03 | Discharge: 2014-07-03 | Disposition: A | Discharge status: Home / Self Care | Payer: Medicare PPO | Source: Ambulatory Visit | Attending: Family Medicine | Admitting: Family Medicine

## 2014-07-03 DIAGNOSIS — R911 Solitary pulmonary nodule: Secondary | ICD-10-CM

## 2014-07-04 ENCOUNTER — Telehealth: Payer: Self-pay | Admitting: Obstetrics and Gynecology

## 2014-07-04 NOTE — Telephone Encounter (Signed)
Called patient and LVM letting her know that her chest CT was stable with no new findings.

## 2014-08-13 ENCOUNTER — Ambulatory Visit (INDEPENDENT_AMBULATORY_CARE_PROVIDER_SITE_OTHER): Payer: Medicare PPO | Admitting: Family Medicine

## 2014-08-13 ENCOUNTER — Encounter: Payer: Self-pay | Admitting: Family Medicine

## 2014-08-13 VITALS — BP 136/52 | HR 82 | Temp 98.1°F | Ht 62.0 in | Wt 260.0 lb

## 2014-08-13 DIAGNOSIS — L03116 Cellulitis of left lower limb: Secondary | ICD-10-CM

## 2014-08-13 DIAGNOSIS — L03119 Cellulitis of unspecified part of limb: Secondary | ICD-10-CM

## 2014-08-13 DIAGNOSIS — L02419 Cutaneous abscess of limb, unspecified: Secondary | ICD-10-CM | POA: Diagnosis not present

## 2014-08-13 DIAGNOSIS — L03818 Cellulitis of other sites: Secondary | ICD-10-CM

## 2014-08-13 MED ORDER — DOXYCYCLINE HYCLATE 100 MG PO TABS: 100.0000 mg | ORAL_TABLET | Freq: Two times a day (BID) | ORAL | Status: DC

## 2014-08-13 NOTE — Patient Instructions (Signed)
I have prescribed doxycycline for your cellulitis. We are also marking the area of cellulitis. Avoid scratching this area. You can continue using cold compresses for the pain and symptoms. Use Tylenol as needed for symptoms and follow up in 1 week. If symptoms worsen instead of get better and you develop fevers, seek immediate reevaluation. Hilton Sinclair, MD  Cellulitis Cellulitis is an infection of the skin and the tissue under the skin. The infected area is usually red and tender. This happens most often in the arms and lower legs. HOME CARE   Take your antibiotic medicine as told. Finish the medicine even if you start to feel better.  Keep the infected arm or leg raised (elevated).  Put a warm cloth on the area up to 4 times per day.  Only take medicines as told by your doctor.  Keep all doctor visits as told. GET HELP IF:  You see red streaks on the skin coming from the infected area.  Your red area gets bigger or turns a dark color.  Your bone or joint under the infected area is painful after the skin heals.  Your infection comes back in the same area or different area.  You have a puffy (swollen) bump in the infected area.  You have new symptoms.  You have a fever. GET HELP RIGHT AWAY IF:   You feel very sleepy.  You throw up (vomit) or have watery poop (diarrhea).  You feel sick and have muscle aches and pains. MAKE SURE YOU:   Understand these instructions.  Will watch your condition.  Will get help right away if you are not doing well or get worse. Document Released: 08/31/2007 Document Revised: 07/29/2013 Document Reviewed: 05/30/2011 [facility] Patient Information 2015 [place], [place]. This information is not intended to replace advice given to you by your health care provider. Make sure you discuss any questions you have with your health care provider.

## 2014-08-13 NOTE — Progress Notes (Signed)
Patient ID: Savannah Burns, female   DOB: 09/13/1950, 64 y.o.   MRN: [id number] Subjective:   CC: Insect bite and ankle swelling  HPI:   Patient presents for same-day evaluation for an insect bite and ankle swelling. She states that 2 days ago she was outdoors watching her granddaughter's baseball game and thought she was bitten by a mosquito in the back of her left leg. Since then she has developed a red, swollen, increasingly large and tender area that is warm. She denies purulence. It is started feeling more swollen and tight as well. She has used cold compresses with help with the pain. She denies fevers or chills. She states it was initially itchy and she scratched at it a lot yesterday.  Review of Systems - Per HPI.  PMH- tobacco dependence, chronic sinusitis, pulmonary nodule, obesity, ingrown toenail, hypertension, dyslipidemia, DJD, type 2 diabetes, depression, COPD, allergic rhinitis, back pain  Objective:  Physical Exam BP 136/52 mmHg  Pulse 82  Temp(Src) 98.1 F (36.7 C) (Oral)  Ht 5' 2"  (1.575 m)  Wt 260 lb (117.935 kg)  BMI 47.54 kg/m2 GEN: NAD, well-appearing Skin: Posterior left lower leg with 6-7 cm diameter area of erythema and induration with a small puncture wound in the center, no streaking, no involvement of the joint. Left anterior lower leg mildly erythematous but per patient this is baseline. Marked tenderness. Pulmonary: Normal effort    Assessment:     Savannah Burns is a 64 y.o. female here for left lower leg swelling after insect bite.    Plan:     # See problem list and after visit summary for problem-specific plans.  # Health Maintenance: Not discussed,  Follow-up: Follow up in 1 week for f/u of LLE cellulitis.  Precepted/examined with Dr Mingo Amber.  Hilton Sinclair, MD [facility]

## 2014-08-14 DIAGNOSIS — L03119 Cellulitis of unspecified part of limb: Secondary | ICD-10-CM | POA: Insufficient documentation

## 2014-08-14 NOTE — Assessment & Plan Note (Signed)
Posterior left lower leg cellulitis about 6-7 cm in diameter with no systemic symptoms. -Doxycycline 2 week course. -Area marked. Follow-up in 1 week for evaluation of improvement. With history of diabetes, if worsening would broaden antibiotics. -Return precautions reviewed for immediate evaluation if symptoms worsen, any fevers, or other major concerns. -Avoid scratching. Keep elevated. Cool compresses and PRN Tylenol for pain.

## 2014-08-20 ENCOUNTER — Encounter: Payer: Self-pay | Admitting: Family Medicine

## 2014-08-20 ENCOUNTER — Ambulatory Visit (INDEPENDENT_AMBULATORY_CARE_PROVIDER_SITE_OTHER): Payer: Medicare PPO | Admitting: Family Medicine

## 2014-08-20 VITALS — BP 132/80 | HR 88 | Temp 98.3°F | Ht 62.0 in | Wt 259.0 lb

## 2014-08-20 DIAGNOSIS — W57XXXA Bitten or stung by nonvenomous insect and other nonvenomous arthropods, initial encounter: Secondary | ICD-10-CM | POA: Diagnosis not present

## 2014-08-20 DIAGNOSIS — L03116 Cellulitis of left lower limb: Secondary | ICD-10-CM | POA: Diagnosis not present

## 2014-08-20 DIAGNOSIS — S30861A Insect bite (nonvenomous) of abdominal wall, initial encounter: Secondary | ICD-10-CM | POA: Diagnosis not present

## 2014-08-20 DIAGNOSIS — R0781 Pleurodynia: Secondary | ICD-10-CM | POA: Diagnosis not present

## 2014-08-20 NOTE — Patient Instructions (Signed)
We removed a tick today. If this area gets increasingly red painful or you develop fevers, seek immediate care. Wear compression hose for your leg swelling. Complete your course of doxycycline for one more week.  Best,  Hilton Sinclair, MD

## 2014-08-20 NOTE — Progress Notes (Signed)
Patient ID: [name], female   DOB: January 15, 1951, 64 y.o.   MRN: [id number] Subjective:   CC: Follow-up on cellulitis, possible tick bite, rib pain  HPI:   Follow-up cellulitis Patient seen 1 week ago for left lower extremity cellulitis. Treated with 2 weeks doxycycline course. she states that symptoms have significantly improved with just one week of anabiotic so far. She is able to walk much more easily and only has pain at the point of the bug bite. Denies fevers, chills, or malaise. Is tolerating doxycycline well. She does complain of chronic lower extremity swelling for which she takes Lasix, and a brownish color that is developing on the bottom of her legs. She also has mild increased swelling at left foot.   Rib pain Patient states that rib pain from "rolling" on a few of her right ribs when leaning over a piece of furniture 3 weeks ago has not improved whatsoever. Pain is severe. She is wondering if she needs to get a bone mineral density testing. She has been taking ibuprofen 800 mg twice daily with minimal improvement. She denies swelling, erythema, or rash. She denies chest pain or shortness of breath.   Possible tick bite  She thinks that she may have gotten a tick bite 2 days ago on her right flank but is unable to see this area well. It is very itchy which is her main symptom whenever she gets a tick bite. [place] She denies fevers or chills. She denies malaise. She has not been able to see if she has increasing erythema.   Review of Systems - Per HPI.    PMH-reviewed. tobacco dependence, chronic sinusitis, pulmonary nodule, obesity, ingrown toenail, hypertension, dyslipidemia, DJD, type 2 diabetes, depression, COPD, allergic rhinitis, back pain Social history: [place] area    Objective:  Physical Exam BP 132/80 mmHg  Pulse 88  Temp(Src) 98.3 F (36.8 C) (Oral)  Ht 5' 2"  (1.575 m)  Wt 259 lb (117.482 kg)  BMI 47.36 kg/m2 GEN:  NAD EXTR/MSK: Bilateral lower extremity swelling at baseline with trace pitting pitting edema at left foot (vs none in right), 2+ radial pulse, no tenderness in foot or calf, no asymmetry otherwise, no erythema; symmetric, trace pitting edema to mid shin, mild chronic venous stasis hyperpigmentation at the lower half of  lower legs bilaterally. Left posterior lower leg with punctate lesion from bug bite, erythema is markedly improved/minimal, no tenderness, no induration.  Tenderness at right costochondral junction 7-8th ribs; no rash or erythema or obvious deformity   Right flank with tiny 75m tick in place and alive with surrounding 1/2 cm diameter erythema and mild induration. Removed with tweezers easily. See procedure note.  PULM: Normal effort  Procedure: Foreign body removal: tick. Verbal consent obtained and verified; written consent signed. Time-out conducted. Skin cleaned with betadyne followed by alcohol wipes x 3 at/around tick bite. Tweezer applied to tick head at part closest to pt skin, gentle traction applied until removed; removed insect inspected and head visualized with small mouth parts intact. Completed without difficulty or bleeding. Meds: Bactroban ointment Advised to call if fevers/chills, erythema, induration, drainage, or persistent bleeding.     Assessment:     VJULICIA KRIEGERis a 64y.o. female here for f/u LLE cellulitis but also with new tick bite and question re: rib pain.    Plan:     # See problem list and after visit summary for problem-specific plans.   #  Health Maintenance: See discssion of dexa scanning.  Follow-up: Follow up in 2 weeks PRN for lack of improvement of area around tick bite or LLE cellulitis lack of resolution.  Hilton Sinclair, MD [place]

## 2014-08-21 DIAGNOSIS — S30861A Insect bite (nonvenomous) of abdominal wall, initial encounter: Secondary | ICD-10-CM | POA: Insufficient documentation

## 2014-08-21 DIAGNOSIS — W57XXXA Bitten or stung by nonvenomous insect and other nonvenomous arthropods, initial encounter: Secondary | ICD-10-CM | POA: Insufficient documentation

## 2014-08-21 DIAGNOSIS — R0781 Pleurodynia: Secondary | ICD-10-CM | POA: Insufficient documentation

## 2014-08-21 NOTE — Assessment & Plan Note (Signed)
Tick removed intact, patient tolerated well. Mild surrounding erythema. See procedure note. -Bactroban ointment administered. -Return precautions reviewed. -Finish 2 week doxycycline course (being treated for unrelated cellulitis, 1 more week left)

## 2014-08-21 NOTE — Assessment & Plan Note (Signed)
3 weeks of right-sided rib pain after leaning on this area. Pain tolerable but patient concerned for her bone mineral density. -Will defer DEXA scanning to PCP. Pt just under 83 (age where screening recommended) but smoker so can obtain early screening. Will send letter to d/w PCP.

## 2014-08-21 NOTE — Assessment & Plan Note (Signed)
Left lower extremity cellulitis markedly improved with overall much decreased erythema and no induration except just around insect bite. Patient's pain is significantly decreased. Afebrile. Her chronic lower extremity edema likely contributed, with chronic venous stasis hyperpigmentation changes. -Complete second week of doxycycline. -Discussed wearing compression hose for lower extremity swelling, thought due to chronic venous insufficiency versus diastolic dysfunction on echocardiogram 2009. Continue Lasix and consider repeating echo if persistent.

## 2014-10-19 ENCOUNTER — Other Ambulatory Visit: Payer: Self-pay | Admitting: Family Medicine

## 2014-10-19 NOTE — Telephone Encounter (Signed)
Please see Rx request, sent to our office in error.

## 2014-11-27 ENCOUNTER — Telehealth: Payer: Self-pay | Admitting: Obstetrics and Gynecology

## 2014-11-27 ENCOUNTER — Encounter: Payer: Self-pay | Admitting: Family Medicine

## 2014-11-27 ENCOUNTER — Ambulatory Visit (INDEPENDENT_AMBULATORY_CARE_PROVIDER_SITE_OTHER): Payer: Medicare PPO | Admitting: Family Medicine

## 2014-11-27 VITALS — BP 150/69 | HR 72 | Temp 98.5°F | Ht 62.0 in | Wt 259.1 lb

## 2014-11-27 DIAGNOSIS — R609 Edema, unspecified: Secondary | ICD-10-CM | POA: Diagnosis not present

## 2014-11-27 DIAGNOSIS — J3489 Other specified disorders of nose and nasal sinuses: Secondary | ICD-10-CM

## 2014-11-27 DIAGNOSIS — R600 Localized edema: Secondary | ICD-10-CM

## 2014-11-27 DIAGNOSIS — M7989 Other specified soft tissue disorders: Secondary | ICD-10-CM | POA: Insufficient documentation

## 2014-11-27 DIAGNOSIS — Z23 Encounter for immunization: Secondary | ICD-10-CM

## 2014-11-27 LAB — COMPREHENSIVE METABOLIC PANEL
ALBUMIN: 3.6 g/dL (ref 3.6–5.1)
ALT: 12 U/L (ref 6–29)
AST: 14 U/L (ref 10–35)
Alkaline Phosphatase: 95 U/L (ref 33–130)
BILIRUBIN TOTAL: 0.3 mg/dL (ref 0.2–1.2)
BUN: 13 mg/dL (ref 7–25)
CHLORIDE: 103 mmol/L (ref 98–110)
CO2: 25 mmol/L (ref 20–31)
CREATININE: 0.59 mg/dL (ref 0.50–0.99)
Calcium: 8.5 mg/dL — ABNORMAL LOW (ref 8.6–10.4)
GLUCOSE: 100 mg/dL — AB (ref 65–99)
Potassium: 4.2 mmol/L (ref 3.5–5.3)
Sodium: 138 mmol/L (ref 135–146)
Total Protein: 7.2 g/dL (ref 6.1–8.1)

## 2014-11-27 LAB — CBC
HCT: 40.8 % (ref 36.0–46.0)
Hemoglobin: 14 g/dL (ref 12.0–15.0)
MCH: 32.2 pg (ref 26.0–34.0)
MCHC: 34.3 g/dL (ref 30.0–36.0)
MCV: 93.8 fL (ref 78.0–100.0)
MPV: 11.1 fL (ref 8.6–12.4)
PLATELETS: 158 10*3/uL (ref 150–400)
RBC: 4.35 MIL/uL (ref 3.87–5.11)
RDW: 14.3 % (ref 11.5–15.5)
WBC: 7.5 10*3/uL (ref 4.0–10.5)

## 2014-11-27 LAB — TSH: TSH: 1.607 u[IU]/mL (ref 0.350–4.500)

## 2014-11-27 MED ORDER — FUROSEMIDE 20 MG PO TABS: 60.0000 mg | ORAL_TABLET | Freq: Every day | ORAL | Status: DC

## 2014-11-27 NOTE — Patient Instructions (Signed)
Thanks for coming in today.   We will check you blood work to look for other causes of you lower extremity swelling.   We will increase your dose of lasix to 2 pills in the morning, and one pill at night.   Also, we will send you over to dermatology to get the area on your face looked at.   If you have any questions, don't hesitate to call.   Return in 2 weeks for follow up of your lower extremity swelling.   Thanks for letting us take care of you!  Sincerely,  Paula Compton, MD Family Medicine - PGY 2

## 2014-11-27 NOTE — Assessment & Plan Note (Signed)
Bilateral. PERC negative. She is already on Lasix 38m daily. No signs of worsening cardiac function by history. Unclear etiology at this point, but may be venous insufficiency. She does need a repeat Echocardiogram done as her last was in 2009. Her legs appear as if they would be prone to infection, although there is no sign of cellulitis at this time. Hopefully this won't progress to cellulitis. She is working on keeping them clean and injury free (including disciplining her cat who continues to scratch her legs).  - Needs follow up echocardiogram given her swelling. - Increase lasix to 467min the am and 20 mg in the pm.  - Check CMET, CBC, TSH today. Will hold off on BNP as she is not in acute CHF.  - Will have her follow up with PCP in 1 month to make sure things are going ok.

## 2014-11-27 NOTE — Progress Notes (Signed)
Patient ID: Savannah Burns, female   DOB: July 23, 1950, 64 y.o.   MRN: [id number]   [facility] Aquilla Hacker, MD Phone: [phone]  Subjective:   # Concerned about area on the right side of her nose - She has significant sun exposure hx as she used to live in [place] and sunbathed frequently.  - She noticed a small "spot" on the side of her nose 6 months ago, that has continued to grow and is worrying her.  - She has not had any pain, numbness, or other skin changes with it.  - She has never had a skin malignancy before.  - She says it has not bled, or caused any other irritation.  - She did not have any injury here.  - She has only had "moles" removed from her back before.  - She would like to see a dermatologist.   # Swelling / Skin changes of Lower Extremities.   - She has chronic lower extremity swelling for which she takes 49m lasix each morning.  - She is not sticking to a low sodium diet.  - she has had some increasing redness / purple changes over the last 2 months.  - Also, she has had an abscess/ skin infection in 07/2014.  - She had DMII, but no peripheral numbness.  - She does not have claudication symptoms, no tingling / sleeping feeling in her legs.  - She does not get pain when she walks.  - no calf pain on either side, and she has not taken any long trips recently. No hx of DVT.  - She has not had fever, chills, and it has not felt hot / swollen - Her cat has been attacking her legs for the past month and she has numerous scratches / small lesions. She says she keeps these clean with soap and water / alcohol when they occur.  - She otherwise, has not had any worsening SOB, orthopnea, Chest pain, palpitations, back pain. No arm pain, nausea, or any other symptoms to suggest further cardiac etiology.  - She has not started any new medications.  - No ulcerations or lesions.  - She says her glucose is well controlled.   All relevant systems  were reviewed and were negative unless otherwise noted in the HPI  Past Medical History Reviewed problem list.  Medications- reviewed and updated Current Outpatient Prescriptions  Medication Sig Dispense Refill  . acetic acid-hydrocortisone (VOSOL-HC) otic solution Place 3 drops into both ears 2 (two) times daily. 10 mL 5  . aspirin 81 MG tablet Take 81 mg by mouth daily.    . Blood Glucose Calibration (GLUCOMETER DEX HIGH CONTROL) LIQD Test CBG's once daily.     .Marland KitchenbuPROPion (WELLBUTRIN) 75 MG tablet Take 1 tablet (75 mg total) by mouth 2 (two) times daily. (Patient not taking: Reported on 06/27/2014) 60 tablet 5  . citalopram (CELEXA) 40 MG tablet TAKE ONE TABLET BY MOUTH EVERY DAY 30 tablet 11  . doxycycline (VIBRA-TABS) 100 MG tablet Take 1 tablet (100 mg total) by mouth 2 (two) times daily. 28 tablet 0  . fluticasone (FLONASE) 50 MCG/ACT nasal spray Place 2 sprays into both nostrils daily. 16 g 3  . furosemide (LASIX) 20 MG tablet Take 3 tablets (60 mg total) by mouth daily. 2 tablets in the morning and one at night. 90 tablet 3  . glucose blood (GLUCOMETER DEX TEST SENSORS) test strip as directed. Use as instructed     .  HYDROcodone-acetaminophen (NORCO) 10-325 MG per tablet Take 1 tablet by mouth every 8 (eight) hours as needed.    Marland Kitchen lisinopril (PRINIVIL,ZESTRIL) 10 MG tablet TAKE ONE TABLET BY MOUTH ONCE DAILY 90 tablet 3  . lovastatin (MEVACOR) 20 MG tablet Take 1 tablet (20 mg total) by mouth at bedtime. 90 tablet 3  . metFORMIN (GLUCOPHAGE) 500 MG tablet Take 1 tablet (500 mg total) by mouth 2 (two) times daily with a meal. 90 tablet 4  . mometasone (NASONEX) 50 MCG/ACT nasal spray Place 2 sprays into the nose daily. 17 g 12  . nicotine (NICODERM CQ - DOSED IN MG/24 HOURS) 14 mg/24hr patch Place 1 patch (14 mg total) onto the skin daily. (Patient not taking: Reported on 06/27/2014) 28 patch 0  . Tdap (BOOSTRIX) 5-2.5-18.5 LF-MCG/0.5 injection Inject 0.5 mLs into the muscle once. (Patient  not taking: Reported on 06/27/2014) 0.5 mL 0   No current facility-administered medications for this visit.   Chief complaint-noted No additions to family history Social history- patient is a non smoker  Objective: BP 150/69 mmHg  Pulse 72  Temp(Src) 98.5 F (36.9 C) (Oral)  Ht 5' 2"  (1.575 m)  Wt 259 lb 1.6 oz (117.527 kg)  BMI 47.38 kg/m2 Gen: NAD, alert, cooperative with exam HEENT: NCAT, EOMI, PERRL Neck: FROM, supple CV: RRR, good S1/S2, no murmur, cap refill <3 in her lower extremities. 2+ distal pulses as well.  Resp: CTABL, no wheezes, non-labored Abd: SNTND, BS present, no guarding or organomegaly Ext: No edema, warm, normal tone, moves UE/LE spontaneously Bilateral lower extremities with mild 1+ pitting edema and some slight erythema but no warmth, pain, or calf pain noted. homan's negative. No ankle edema or foot edema. No drainage, numerous small scratches, but none look infected, and no tenderness.  Neuro: Alert and oriented, No gross deficits Skin: Nose with area of small red / cleared center and rolled pearly borders. On the right side of the nose. About 14m in size.   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 With lesion on nose that resembles a basal cell carcinoma. Given the location, will refer to derm for biopsy / evaluation.  - Derm referral.  - no other concerning skin lesions identified at this time on the head or neck. Full body skin exam not performed.  - follow up as needed.   Swelling of lower extremity Bilateral. PERC negative. She is already on Lasix 437mdaily. No signs of worsening cardiac function by history. Unclear etiology at this point, but may be venous insufficiency. She does need a repeat Echocardiogram done as her last was in 2009. Her legs appear as if they would be prone to infection, although there is no sign of cellulitis at this time. Hopefully this won't progress to cellulitis. She is working on keeping them clean and  injury free (including disciplining her cat who continues to scratch her legs).  - Needs follow up echocardiogram given her swelling. - Increase lasix to 4070mn the am and 20 mg in the pm.  - Check CMET, CBC, TSH today. Will hold off on BNP as she is not in acute CHF.  - Will have her follow up with PCP in 1 month to make sure things are going ok.

## 2014-11-27 NOTE — Telephone Encounter (Signed)
LMOVM. Please advise patient that she has a dermatology appointment scheduled for:  Thursday, December 04, 2014 at 11:30 AM   Dr. [facility] [phone] -[address] [place] [address]  [phone]  Patient can call to reschedule this appt, if it does not work with her schedule.

## 2014-11-28 ENCOUNTER — Telehealth: Payer: Self-pay | Admitting: Family Medicine

## 2014-11-28 NOTE — Telephone Encounter (Signed)
Patient has an appt on 12-11-14 with Dr. Minda Ditto.  Patient is already scheduled for her derm appt. Lamberto Dinapoli,CMA

## 2014-11-28 NOTE — Telephone Encounter (Signed)
Called to inform of normal lab results. Needs follow up regarding swelling in her legs and increased lasix dose in 2 weeks / scheduling of echocardiogram. She should also be called for appt. With Derm. Referral placed last visit.    CGM MD

## 2014-12-04 DIAGNOSIS — L918 Other hypertrophic disorders of the skin: Secondary | ICD-10-CM | POA: Diagnosis not present

## 2014-12-04 DIAGNOSIS — C44319 Basal cell carcinoma of skin of other parts of face: Secondary | ICD-10-CM | POA: Diagnosis not present

## 2014-12-04 DIAGNOSIS — C44310 Basal cell carcinoma of skin of unspecified parts of face: Secondary | ICD-10-CM | POA: Diagnosis not present

## 2014-12-11 ENCOUNTER — Ambulatory Visit (INDEPENDENT_AMBULATORY_CARE_PROVIDER_SITE_OTHER): Payer: Medicare PPO | Admitting: Family Medicine

## 2014-12-11 ENCOUNTER — Encounter: Payer: Self-pay | Admitting: Family Medicine

## 2014-12-11 VITALS — BP 124/60 | HR 72 | Ht 62.0 in | Wt 254.0 lb

## 2014-12-11 DIAGNOSIS — R010 Benign and innocent cardiac murmurs: Secondary | ICD-10-CM

## 2014-12-11 DIAGNOSIS — M7989 Other specified soft tissue disorders: Secondary | ICD-10-CM | POA: Diagnosis not present

## 2014-12-11 DIAGNOSIS — R011 Cardiac murmur, unspecified: Secondary | ICD-10-CM

## 2014-12-11 DIAGNOSIS — E119 Type 2 diabetes mellitus without complications: Secondary | ICD-10-CM | POA: Diagnosis not present

## 2014-12-11 LAB — POCT GLYCOSYLATED HEMOGLOBIN (HGB A1C): Hemoglobin A1C: 6.2

## 2014-12-11 MED ORDER — FUROSEMIDE 20 MG PO TABS: 60.0000 mg | ORAL_TABLET | Freq: Every day | ORAL | Status: DC

## 2014-12-11 NOTE — Patient Instructions (Signed)
Thanks for coming in today.   Your diabetes is very well controlled.   I recommend the Whole 30 Diet as a way to jumpstart diet change in your life. It is a great starting point.   We will order the echocardiogram, and you will be called with that appointment.   I will send in the prescription to increase your lasix.   If you have any questions or concerns in the mean time, then you can come back and see [place], but otherwise you should be ok to follow up in 6 months.    Thanks for letting us take care of you.   Sincerely,  Paula Compton, MD Family Medicine - PGY 2

## 2014-12-18 ENCOUNTER — Telehealth: Payer: Self-pay | Admitting: Obstetrics and Gynecology

## 2014-12-18 NOTE — Telephone Encounter (Signed)
Dr. Minda Ditto saw pat on 9/15 and was to have ordered an echocardiogram for patient.  Don't see where referral or and order was  placed. Patient c/o of sleeping more than ususal and fatigue.  Please place order for procedure and contact for appt asap.

## 2014-12-18 NOTE — Telephone Encounter (Signed)
Charmaine from [facility] calls, patient was scheduled for a Echo on 12/24/14. Echo needs pre-cert. When doing the pre-cert please use [facility] as the facility.

## 2014-12-18 NOTE — Telephone Encounter (Signed)
No auth required for [facility].  Patient has been scheduled for 12-24-2014 at 3pm.  After speaking with patient decided to make an appt before the weekend to make sure this didn't need to be scheduled sooner.  Patient is scheduled for a same day appt tomorrow at 130pm with pcp. Jazmin Hartsell,CMA

## 2014-12-18 NOTE — Telephone Encounter (Signed)
That sounds appropriate. Thanks!

## 2014-12-19 ENCOUNTER — Ambulatory Visit (INDEPENDENT_AMBULATORY_CARE_PROVIDER_SITE_OTHER): Payer: Medicare PPO | Admitting: Family Medicine

## 2014-12-19 VITALS — BP 118/66 | HR 75 | Temp 100.5°F | Wt 256.0 lb

## 2014-12-19 DIAGNOSIS — R5383 Other fatigue: Secondary | ICD-10-CM | POA: Diagnosis not present

## 2014-12-19 DIAGNOSIS — G4719 Other hypersomnia: Secondary | ICD-10-CM | POA: Insufficient documentation

## 2014-12-19 NOTE — Progress Notes (Signed)
   Subjective:    Patient ID: Savannah Burns, female    DOB: 12/25/1950, 64 y.o.   MRN: [id number]  Seen for Same day visit for   CC: daytime Sleepiness  Patient complains of sleepiness fro about 4 months She has tried to ignore it but has continued.  She tries to go to bed at 11 or 12 pm  She will toss and turn for a couple of hours and get up.  She will not intentionally take a nap but this occurs when she sits down. Lasts for 4 or 5 hours  Snores and wakes up gasping for air over the last 2-3 months  Has woken up choking.  Drinks about 12 to 24 oz of caffeine. Sometimes after 5 pm.  She is active tobacco smoker  Going to have an ECHO for persistent swelling in her feet.   Symptoms Fever: no Sweating at night: no Weight Loss: no Shortness of Breath: no Severe Snoring or Daytime Sleepiness: yes  Feeling Down: no Not enjoying things: no Rash: no Chest Pain or irregular heart beat:  no  Review of Systems   See HPI for ROS. Objective:  BP 118/66 mmHg  Pulse 75  Temp(Src) 100.5 F (38.1 C) (Oral)  Wt 256 lb (116.121 kg)  General: NAD HEENT: large neck  Cardiac: RRR, normal heart sounds, no murmurs.  Respiratory: CTAB, normal effort Abdomen: protuberant abdomen,  Extremities: no edema or cyanosis. WWP. Skin: warm and dry, no rashes noted Neuro: alert and oriented, no focal deficits     Assessment & Plan:   Excessive daytime sleepiness Having trouble staying awake during the day and also having trouble falling asleep at times at night. No thyroid disease and current smoker Reports some gasping for air while she is lying down, possible for PND as getting an echo for persistent leg swelling - Encouraged sleep hygiene - Recommendations as noted in the AVS - If no improvement after 3 weeks to consider a sleep study and possible initiation of trazodone 25 mg

## 2014-12-19 NOTE — Patient Instructions (Signed)
Thank you for coming in,   Please work on your sleep hygiene.  1. Not caffienated beverages after 5 pm.  2. Pick up melatonin and take it 30 minutes before being ready to go to sleep.  3. No lights, TV, phone or tablets  4. Do everything you can to avoid napping during the day  5. Please continue exercise  6. Please try to stop smoking.   Please follow up with Dr. Gerarda Fraction or myself if none of these recommendations help after 3 weeks.   Sign up for My Chart to have easy access to your labs results, and communication with your Primary care physician   Please feel free to call with any questions or concerns at any time, at [phone]. --Dr. Raeford Razor

## 2014-12-19 NOTE — Assessment & Plan Note (Signed)
Having trouble staying awake during the day and also having trouble falling asleep at times at night. No thyroid disease and current smoker Reports some gasping for air while she is lying down, possible for PND as getting an echo for persistent leg swelling - Encouraged sleep hygiene - Recommendations as noted in the AVS - If no improvement after 3 weeks to consider a sleep study and possible initiation of trazodone 25 mg

## 2014-12-22 NOTE — Telephone Encounter (Signed)
Authorization placed in appointment notes.  Auth # [id number] per heather at [facility].  [phone]. Marin Milley,CMA

## 2014-12-24 ENCOUNTER — Other Ambulatory Visit (HOSPITAL_COMMUNITY): Payer: Medicare PPO

## 2014-12-28 NOTE — Progress Notes (Signed)
Patient ID: Savannah Burns, female   DOB: [birth year], 64 y.o.   MRN: [id number]   [facility] Aquilla Hacker, MD Phone: [phone]  Subjective:   # DMII - pt. Here for follow up of DMII - A1C is 6.2 - excellent blood sugars - She would like to get it down further.  - no hypoglycemic episodes - Tolerating her medication well - no numbness, vision changes, chest pain.  - she is trying to work on her diet and exercise. We had an extensive discussion about this.   # Basal Cell Carcinoma of the Face - Pt. Was here previously with what appeared to be basal cell carcinoma of the face.  - this was removed by her dermatologist and is healing well.  - no issues since. She continues to follow up with the dermatologist.   # Lower Extremity Edema - She continues to have this.  - No clear etiology - No SOB - No chest pain - No Sleep Apnea symptoms at this time  All relevant systems were reviewed and were negative unless otherwise noted in the HPI  Past Medical History Reviewed problem list.  Medications- reviewed and updated Current Outpatient Prescriptions  Medication Sig Dispense Refill  . acetic acid-hydrocortisone (VOSOL-HC) otic solution Place 3 drops into both ears 2 (two) times daily. 10 mL 5  . aspirin 81 MG tablet Take 81 mg by mouth daily.    . Blood Glucose Calibration (GLUCOMETER DEX HIGH CONTROL) LIQD Test CBG's once daily.     Marland Kitchen buPROPion (WELLBUTRIN) 75 MG tablet Take 1 tablet (75 mg total) by mouth 2 (two) times daily. (Patient not taking: Reported on 06/27/2014) 60 tablet 5  . citalopram (CELEXA) 40 MG tablet TAKE ONE TABLET BY MOUTH EVERY DAY 30 tablet 11  . doxycycline (VIBRA-TABS) 100 MG tablet Take 1 tablet (100 mg total) by mouth 2 (two) times daily. 28 tablet 0  . fluticasone (FLONASE) 50 MCG/ACT nasal spray Place 2 sprays into both nostrils daily. 16 g 3  . furosemide (LASIX) 20 MG tablet Take 3 tablets (60 mg total) by mouth daily. 2  tablets in the morning and one at night. 90 tablet 3  . glucose blood (GLUCOMETER DEX TEST SENSORS) test strip as directed. Use as instructed     . HYDROcodone-acetaminophen (NORCO) 10-325 MG per tablet Take 1 tablet by mouth every 8 (eight) hours as needed.    Marland Kitchen lisinopril (PRINIVIL,ZESTRIL) 10 MG tablet TAKE ONE TABLET BY MOUTH ONCE DAILY 90 tablet 3  . lovastatin (MEVACOR) 20 MG tablet Take 1 tablet (20 mg total) by mouth at bedtime. 90 tablet 3  . metFORMIN (GLUCOPHAGE) 500 MG tablet Take 1 tablet (500 mg total) by mouth 2 (two) times daily with a meal. 90 tablet 4  . mometasone (NASONEX) 50 MCG/ACT nasal spray Place 2 sprays into the nose daily. 17 g 12  . nicotine (NICODERM CQ - DOSED IN MG/24 HOURS) 14 mg/24hr patch Place 1 patch (14 mg total) onto the skin daily. (Patient not taking: Reported on 06/27/2014) 28 patch 0  . Tdap (BOOSTRIX) 5-2.5-18.5 LF-MCG/0.5 injection Inject 0.5 mLs into the muscle once. (Patient not taking: Reported on 06/27/2014) 0.5 mL 0   No current facility-administered medications for this visit.   Chief complaint-noted No additions to family history Social history- patient is a current smoker  Objective: BP 124/60 mmHg  Pulse 72  Ht 5' 2"  (1.575 m)  Wt 254 lb (115.214 kg)  BMI 46.45  kg/m2 Gen: NAD, alert, cooperative with exam HEENT: NCAT, EOMI, PERRL Neck: FROM, supple CV: RRR, good S1/S2, no murmur Resp: CTABL, no wheezes, non-labored Abd: SNTND, BS present, no guarding or organomegaly Ext: trace edema, warm, normal tone, moves UE/LE spontaneously Neuro: Alert and oriented, No gross deficits Skin: no rashes no lesions  Assessment/Plan:  # DMII - well controlled. Continue current regimen. Diet and exercise would be excellent additions. We discussed this at length.  - continue current regimen - Diet / Exercise - f/u in 6 months  # Basal Cell Carcinoma - s/p removal. Incision site healing well.  - F/U prn or with the dermatologist.  # LE Edema  without SOB -  Concerning given her risk factors. Would get an Echo. Otherwise continue lasix - Lasix daily as needed - 2d Echo - f/u with Dr. Gerarda Fraction

## 2015-01-07 ENCOUNTER — Ambulatory Visit (HOSPITAL_COMMUNITY): Payer: Medicare PPO | Attending: Cardiovascular Disease

## 2015-01-07 ENCOUNTER — Other Ambulatory Visit: Payer: Self-pay

## 2015-01-07 DIAGNOSIS — F17200 Nicotine dependence, unspecified, uncomplicated: Secondary | ICD-10-CM | POA: Insufficient documentation

## 2015-01-07 DIAGNOSIS — R011 Cardiac murmur, unspecified: Secondary | ICD-10-CM | POA: Insufficient documentation

## 2015-01-07 DIAGNOSIS — E119 Type 2 diabetes mellitus without complications: Secondary | ICD-10-CM | POA: Diagnosis not present

## 2015-01-07 DIAGNOSIS — I517 Cardiomegaly: Secondary | ICD-10-CM | POA: Diagnosis not present

## 2015-01-07 DIAGNOSIS — I10 Essential (primary) hypertension: Secondary | ICD-10-CM | POA: Insufficient documentation

## 2015-01-07 DIAGNOSIS — M7989 Other specified soft tissue disorders: Secondary | ICD-10-CM

## 2015-01-07 DIAGNOSIS — E785 Hyperlipidemia, unspecified: Secondary | ICD-10-CM | POA: Diagnosis not present

## 2015-01-15 DIAGNOSIS — Z6841 Body Mass Index (BMI) 40.0 and over, adult: Secondary | ICD-10-CM | POA: Diagnosis not present

## 2015-01-15 DIAGNOSIS — M47816 Spondylosis without myelopathy or radiculopathy, lumbar region: Secondary | ICD-10-CM | POA: Diagnosis not present

## 2015-01-15 DIAGNOSIS — M791 Myalgia: Secondary | ICD-10-CM | POA: Diagnosis not present

## 2015-01-15 DIAGNOSIS — M47817 Spondylosis without myelopathy or radiculopathy, lumbosacral region: Secondary | ICD-10-CM | POA: Diagnosis not present

## 2015-01-15 DIAGNOSIS — M545 Low back pain: Secondary | ICD-10-CM | POA: Diagnosis not present

## 2015-01-16 ENCOUNTER — Telehealth: Payer: Self-pay | Admitting: Obstetrics and Gynecology

## 2015-01-16 ENCOUNTER — Telehealth: Payer: Self-pay | Admitting: Family Medicine

## 2015-01-16 DIAGNOSIS — G479 Sleep disorder, unspecified: Secondary | ICD-10-CM

## 2015-01-16 DIAGNOSIS — G4719 Other hypersomnia: Secondary | ICD-10-CM

## 2015-01-16 NOTE — Telephone Encounter (Signed)
Called to inform her of Echo results. Grade 2 Diastolic Dysfunction. She acknowledged these results. Will forward to PCP   CGM MD

## 2015-01-16 NOTE — Telephone Encounter (Signed)
Pt called and would like to have a sleep study done. Please let her know when everything is set up. Blima Rich

## 2015-01-16 NOTE — Telephone Encounter (Signed)
Will forward to MD. Akif Weldy,CMA  

## 2015-01-17 NOTE — Telephone Encounter (Signed)
Sleep study ordered

## 2015-01-29 DIAGNOSIS — Z79899 Other long term (current) drug therapy: Secondary | ICD-10-CM | POA: Diagnosis not present

## 2015-01-29 DIAGNOSIS — M545 Low back pain: Secondary | ICD-10-CM | POA: Diagnosis not present

## 2015-01-29 DIAGNOSIS — I10 Essential (primary) hypertension: Secondary | ICD-10-CM | POA: Diagnosis not present

## 2015-01-29 DIAGNOSIS — M5135 Other intervertebral disc degeneration, thoracolumbar region: Secondary | ICD-10-CM | POA: Diagnosis not present

## 2015-01-29 DIAGNOSIS — E119 Type 2 diabetes mellitus without complications: Secondary | ICD-10-CM | POA: Diagnosis not present

## 2015-01-29 DIAGNOSIS — F17200 Nicotine dependence, unspecified, uncomplicated: Secondary | ICD-10-CM | POA: Diagnosis not present

## 2015-02-02 DIAGNOSIS — M47816 Spondylosis without myelopathy or radiculopathy, lumbar region: Secondary | ICD-10-CM | POA: Diagnosis not present

## 2015-02-02 DIAGNOSIS — Z79899 Other long term (current) drug therapy: Secondary | ICD-10-CM | POA: Diagnosis not present

## 2015-02-02 DIAGNOSIS — Z79891 Long term (current) use of opiate analgesic: Secondary | ICD-10-CM | POA: Diagnosis not present

## 2015-02-02 DIAGNOSIS — G894 Chronic pain syndrome: Secondary | ICD-10-CM | POA: Diagnosis not present

## 2015-02-03 ENCOUNTER — Other Ambulatory Visit: Payer: Self-pay | Admitting: *Deleted

## 2015-02-03 MED ORDER — CITALOPRAM HYDROBROMIDE 40 MG PO TABS: ORAL_TABLET | ORAL | Status: DC

## 2015-03-11 NOTE — Addendum Note (Signed)
Addended by: Clearance Coots E on: 03/11/2015 02:00 PM   Modules accepted: Level of Service

## 2015-03-13 ENCOUNTER — Ambulatory Visit (INDEPENDENT_AMBULATORY_CARE_PROVIDER_SITE_OTHER): Payer: Medicare PPO | Admitting: Family Medicine

## 2015-03-13 VITALS — BP 153/76 | HR 79 | Temp 99.0°F | Ht 62.0 in | Wt 258.3 lb

## 2015-03-13 DIAGNOSIS — H6091 Unspecified otitis externa, right ear: Secondary | ICD-10-CM | POA: Diagnosis not present

## 2015-03-13 DIAGNOSIS — H6691 Otitis media, unspecified, right ear: Secondary | ICD-10-CM

## 2015-03-13 MED ORDER — AMOXICILLIN 875 MG PO TABS: 875.0000 mg | ORAL_TABLET | Freq: Two times a day (BID) | ORAL | Status: DC

## 2015-03-13 MED ORDER — NEOMYCIN-POLYMYXIN-HC 3.5-10000-1 OT SOLN: 4.0000 [drp] | Freq: Four times a day (QID) | OTIC | Status: DC

## 2015-03-13 NOTE — Patient Instructions (Signed)
You have an outer ear and middle ear infection.  Use the ear drops 4 times a day, 4 drops each for about a week.  Use the Amoxicillin twice a day for 10 days.  If you're not feeling better by next week come back and see [place].    It was good to meet you today.

## 2015-03-13 NOTE — Progress Notes (Signed)
Subjective:    Savannah Burns is a 64 y.o. female who presents to [facility] today for Right ear pain and pressure:  1.  Right ear pain:  Woke up with this today. Has had URI symptoms for the past week or so. Describes this as rhinorrhea and sore throat. Mild cough. No fevers or chills. Made appointment to see Dr. today secondary to pain in her right ear. States she has never had earache before. She has put a cotton ball in her ear to help keep the cold wind out. Otherwise she's not tried anything for relief. No fevers or chills.     ROS as above per HPI, otherwise neg.  No drainage from ear  The following portions of the patient's history were reviewed and updated as appropriate: allergies, current medications, past medical history, family and social history, and problem list. Patient is a nonsmoker.    PMH reviewed.  Past Medical History  Diagnosis Date  . Hyperlipidemia   . Hypertension   . COPD (chronic obstructive pulmonary disease)   . Depression   . Diabetes mellitus without complication    Past Surgical History  Procedure Laterality Date  . Spine surgery    . Cesarean section      Medications reviewed. Current Outpatient Prescriptions  Medication Sig Dispense Refill  . acetic acid-hydrocortisone (VOSOL-HC) otic solution Place 3 drops into both ears 2 (two) times daily. 10 mL 5  . aspirin 81 MG tablet Take 81 mg by mouth daily.    . Blood Glucose Calibration (GLUCOMETER DEX HIGH CONTROL) LIQD Test CBG's once daily.     Marland Kitchen buPROPion (WELLBUTRIN) 75 MG tablet Take 1 tablet (75 mg total) by mouth 2 (two) times daily. (Patient not taking: Reported on 06/27/2014) 60 tablet 5  . citalopram (CELEXA) 40 MG tablet TAKE ONE TABLET BY MOUTH EVERY DAY 30 tablet 2  . doxycycline (VIBRA-TABS) 100 MG tablet Take 1 tablet (100 mg total) by mouth 2 (two) times daily. 28 tablet 0  . fluticasone (FLONASE) 50 MCG/ACT nasal spray Place 2 sprays into both nostrils daily. 16 g 3  . furosemide (LASIX)  20 MG tablet Take 3 tablets (60 mg total) by mouth daily. 2 tablets in the morning and one at night. 90 tablet 3  . glucose blood (GLUCOMETER DEX TEST SENSORS) test strip as directed. Use as instructed     . HYDROcodone-acetaminophen (NORCO) 10-325 MG per tablet Take 1 tablet by mouth every 8 (eight) hours as needed.    Marland Kitchen lisinopril (PRINIVIL,ZESTRIL) 10 MG tablet TAKE ONE TABLET BY MOUTH ONCE DAILY 90 tablet 3  . lovastatin (MEVACOR) 20 MG tablet Take 1 tablet (20 mg total) by mouth at bedtime. 90 tablet 3  . metFORMIN (GLUCOPHAGE) 500 MG tablet Take 1 tablet (500 mg total) by mouth 2 (two) times daily with a meal. 90 tablet 4  . mometasone (NASONEX) 50 MCG/ACT nasal spray Place 2 sprays into the nose daily. 17 g 12  . nicotine (NICODERM CQ - DOSED IN MG/24 HOURS) 14 mg/24hr patch Place 1 patch (14 mg total) onto the skin daily. (Patient not taking: Reported on 06/27/2014) 28 patch 0  . Tdap (BOOSTRIX) 5-2.5-18.5 LF-MCG/0.5 injection Inject 0.5 mLs into the muscle once. (Patient not taking: Reported on 06/27/2014) 0.5 mL 0   No current facility-administered medications for this visit.     Objective:   Physical Exam BP 153/76 mmHg  Pulse 79  Temp(Src) 99 F (37.2 C) (Oral)  Ht 5' 2"  (1.575  m)  Wt 258 lb 4.8 oz (117.164 kg)  BMI 47.23 kg/m2 Gen:  Patient sitting on exam table, appears stated age in no acute distress Head: Normocephalic atraumatic Eyes: EOMI, PERRL, sclera and conjunctiva non-erythematous Ears:  Left ear with clear canal and clear TM. Right ear with erythematous and edematous ear canal. Also with erythematous TM. Nose:  Clear bilaterally. Mouth: Mucosa membranes moist. Tonsils +2, nonenlarged, non-erythematous. Neck: No cervical lymphadenopathy noted Heart:  RRR, no murmurs auscultated. Pulm:  Clear to auscultation bilaterally with good air movement.  No wheezes or rales noted.      No results found for this or any previous visit (from the past 72 hour(s)).  1. Right  otitis externa: -Cortisporin Otic drops for treatment. She can continue using the cotton ball when she is outside due to the severe cold we are having. -Follow-up Monday or Tuesday if no improvement. Follow-up sooner if worsening.  #2. Right otitis media: -Superimposed on otitis externa as above. -Treat with amoxicillin. -She should be feeling much better by late this weekend. Today course of amoxicillin.

## 2015-04-17 ENCOUNTER — Ambulatory Visit (INDEPENDENT_AMBULATORY_CARE_PROVIDER_SITE_OTHER): Payer: Medicare Other | Admitting: Family Medicine

## 2015-04-17 ENCOUNTER — Encounter: Payer: Self-pay | Admitting: Family Medicine

## 2015-04-17 ENCOUNTER — Other Ambulatory Visit (HOSPITAL_COMMUNITY)
Admission: RE | Admit: 2015-04-17 | Discharge: 2015-04-17 | Disposition: A | Discharge status: Home / Self Care | Payer: Medicare Other | Source: Ambulatory Visit | Attending: Family Medicine | Admitting: Family Medicine

## 2015-04-17 VITALS — BP 140/70 | HR 76 | Temp 98.3°F | Wt 258.0 lb

## 2015-04-17 DIAGNOSIS — Z01419 Encounter for gynecological examination (general) (routine) without abnormal findings: Secondary | ICD-10-CM | POA: Diagnosis present

## 2015-04-17 DIAGNOSIS — Z124 Encounter for screening for malignant neoplasm of cervix: Secondary | ICD-10-CM

## 2015-04-17 DIAGNOSIS — N950 Postmenopausal bleeding: Secondary | ICD-10-CM

## 2015-04-17 DIAGNOSIS — Z1151 Encounter for screening for human papillomavirus (HPV): Secondary | ICD-10-CM | POA: Diagnosis present

## 2015-04-17 LAB — POCT HEMOGLOBIN: Hemoglobin: 13.2 g/dL (ref 12.2–16.2)

## 2015-04-17 NOTE — Progress Notes (Signed)
Subjective:     Patient ID: Savannah Burns, female   DOB: 1950-10-21, 65 y.o.   MRN: [id number]  Vaginal Bleeding The patient's primary symptoms include vaginal bleeding. Primary symptoms comment: Today/ this morning. This is a new problem. The current episode started today. The problem occurs constantly. The problem has been unchanged. The patient is experiencing no pain. Pregnant now: LMP was 15 years ago. Pertinent negatives include no abdominal pain, constipation, diarrhea, nausea, urgency or vomiting. Associated symptoms comments: 2 wks ago when she stop urinating, she will have pain across her lower abdomen, this got better. She also endorsed low back pain last week.. The vaginal discharge was normal. Vaginal bleeding amount: Bleeding after menopause. She has not been passing clots. The symptoms are aggravated by activity. She has tried nothing for the symptoms. The treatment provided no relief. She is not sexually active. Her past medical history is significant for endometriosis. There is no history of an abdominal surgery, a Cesarean section, a gynecological surgery, menorrhagia or metrorrhagia.  Gynecologic Exam The patient's primary symptoms include vaginal bleeding. Primary symptoms comment: Today/ this morning. Pertinent negatives include no abdominal pain, constipation, diarrhea, nausea, urgency or vomiting. Her past medical history is significant for endometriosis. There is no history of an abdominal surgery, a Cesarean section, a gynecological surgery, menorrhagia or metrorrhagia.  Last PAP was in 2004. She stated she planned on scheduling PAP with her PCP but she someone did not get it done. She will like to get her PAP done today if possible.  Current Outpatient Prescriptions on File Prior to Visit  Medication Sig Dispense Refill  . acetic acid-hydrocortisone (VOSOL-HC) otic solution Place 3 drops into both ears 2 (two) times daily. 10 mL 5  . amoxicillin (AMOXIL) 875 MG tablet Take 1  tablet (875 mg total) by mouth 2 (two) times daily. 20 tablet 0  . aspirin 81 MG tablet Take 81 mg by mouth daily.    . Blood Glucose Calibration (GLUCOMETER DEX HIGH CONTROL) LIQD Test CBG's once daily.     Marland Kitchen buPROPion (WELLBUTRIN) 75 MG tablet Take 1 tablet (75 mg total) by mouth 2 (two) times daily. (Patient not taking: Reported on 06/27/2014) 60 tablet 5  . citalopram (CELEXA) 40 MG tablet TAKE ONE TABLET BY MOUTH EVERY DAY 30 tablet 2  . doxycycline (VIBRA-TABS) 100 MG tablet Take 1 tablet (100 mg total) by mouth 2 (two) times daily. 28 tablet 0  . fluticasone (FLONASE) 50 MCG/ACT nasal spray Place 2 sprays into both nostrils daily. 16 g 3  . furosemide (LASIX) 20 MG tablet Take 3 tablets (60 mg total) by mouth daily. 2 tablets in the morning and one at night. 90 tablet 3  . glucose blood (GLUCOMETER DEX TEST SENSORS) test strip as directed. Use as instructed     . HYDROcodone-acetaminophen (NORCO) 10-325 MG per tablet Take 1 tablet by mouth every 8 (eight) hours as needed.    Marland Kitchen lisinopril (PRINIVIL,ZESTRIL) 10 MG tablet TAKE ONE TABLET BY MOUTH ONCE DAILY 90 tablet 3  . lovastatin (MEVACOR) 20 MG tablet Take 1 tablet (20 mg total) by mouth at bedtime. 90 tablet 3  . metFORMIN (GLUCOPHAGE) 500 MG tablet Take 1 tablet (500 mg total) by mouth 2 (two) times daily with a meal. 90 tablet 4  . mometasone (NASONEX) 50 MCG/ACT nasal spray Place 2 sprays into the nose daily. 17 g 12  . neomycin-polymyxin-hydrocortisone (CORTISPORIN) otic solution Place 4 drops into the right ear 4 (four) times daily.  10 mL 0  . nicotine (NICODERM CQ - DOSED IN MG/24 HOURS) 14 mg/24hr patch Place 1 patch (14 mg total) onto the skin daily. (Patient not taking: Reported on 06/27/2014) 28 patch 0  . Tdap (BOOSTRIX) 5-2.5-18.5 LF-MCG/0.5 injection Inject 0.5 mLs into the muscle once. (Patient not taking: Reported on 06/27/2014) 0.5 mL 0   No current facility-administered medications on file prior to visit.   Past Medical  History  Diagnosis Date  . Hyperlipidemia   . Hypertension   . COPD (chronic obstructive pulmonary disease) ([place])   . Depression   . Diabetes mellitus without complication ([place])       Review of Systems  Constitutional: Negative for fatigue.  Respiratory: Negative.   Cardiovascular: Negative.   Gastrointestinal: Negative.  Negative for nausea, vomiting, abdominal pain, diarrhea, constipation and anal bleeding.  Genitourinary: Positive for vaginal bleeding. Negative for urgency and menorrhagia.  All other systems reviewed and are negative.      Filed Vitals:   04/17/15 1055  BP: 140/70  Pulse: 76  Temp: 98.3 F (36.8 C)  TempSrc: Oral  Weight: 258 lb (117.028 kg)    Objective:   Physical Exam  Constitutional: She appears well-developed. No distress.  Cardiovascular: Normal rate, regular rhythm and normal heart sounds.   No murmur heard. Pulmonary/Chest: Effort normal and breath sounds normal. No respiratory distress. She has no wheezes.  Abdominal: Soft. Bowel sounds are normal. She exhibits no distension and no mass. There is no tenderness.  Genitourinary: Vagina normal and uterus normal.    There is no tenderness, lesion or injury on the right labia. There is no tenderness, lesion or injury on the left labia. Cervix exhibits no motion tenderness, no discharge and no friability.    Nursing note and vitals reviewed.      Assessment:     Postmenopausal bleeding. Gynecologic exam     Plan:     Check problem list.

## 2015-04-17 NOTE — Assessment & Plan Note (Signed)
Except for dry blood on vulva, Gyne exam was normal. Specimen obtained for PAP and sent off to pathology. I will contact her with result as soon as I have it.

## 2015-04-17 NOTE — Assessment & Plan Note (Signed)
No obvious source of bleeding found with pelvic exam. Pelvic U/S ordered. I also referred to [facility] clinic for Endometrial biopsy. Not acutely bleeding now. POC Hgb checked today was normal. Return precaution discussed. If bleeding worsens to go to the ED.

## 2015-04-17 NOTE — Patient Instructions (Signed)
Postmenopausal Bleeding Postmenopausal bleeding is any bleeding after menopause. Menopause is when a woman's period stops. Any type of bleeding after menopause is concerning. It should be checked by your doctor. Any treatment will depend on the cause. HOME CARE Watch your condition for any changes.  Avoid the use of tampons and douches as told by your doctor.  Change your pads often.  Get regular pelvic exams and Pap tests.  Keep all appointments for tests as told by your doctor. GET HELP IF:  Your bleeding lasts for more than 1 week.  You have belly (abdominal) pain.  You have bleeding after sex (intercourse). GET HELP RIGHT AWAY IF:   You have a fever, chills, a headache, dizziness, muscle aches, and bleeding.  You have strong pain with bleeding.  You have clumps of blood (blood clots) coming from your vagina.  You have bleeding and need more than 1 pad an hour.  You feel like you are going to pass out (faint). MAKE SURE YOU:  Understand these instructions.  Will watch your condition.  Will get help right away if you are not doing well or get worse.   This information is not intended to replace advice given to you by your health care provider. Make sure you discuss any questions you have with your health care provider.   Document Released: 12/22/2007 Document Revised: 03/19/2013 Document Reviewed: 10/11/2012 Elsevier Interactive Patient Education [employer].

## 2015-04-20 LAB — CYTOLOGY - PAP

## 2015-04-21 ENCOUNTER — Telehealth: Payer: Self-pay | Admitting: Family Medicine

## 2015-04-21 NOTE — Telephone Encounter (Signed)
I was unable to reach patient. Message let to call for result.   If she calls back please let her know that her PAP test was normal. F/U U/S as scheduled and colposcopy clinic for endometrial biopsy.

## 2015-04-22 ENCOUNTER — Encounter: Payer: Self-pay | Admitting: *Deleted

## 2015-04-22 NOTE — Telephone Encounter (Signed)
Letter printed and mailed to patient.  She is scheduled for 04/23/15 for her ultrasound. Jazmin Hartsell,CMA

## 2015-04-23 ENCOUNTER — Telehealth: Payer: Self-pay | Admitting: Family Medicine

## 2015-04-23 ENCOUNTER — Ambulatory Visit (HOSPITAL_COMMUNITY)
Admission: RE | Admit: 2015-04-23 | Discharge: 2015-04-23 | Disposition: A | Discharge status: Home / Self Care | Payer: Medicare Other | Source: Ambulatory Visit | Attending: Family Medicine | Admitting: Family Medicine

## 2015-04-23 DIAGNOSIS — N950 Postmenopausal bleeding: Secondary | ICD-10-CM

## 2015-04-23 DIAGNOSIS — D252 Subserosal leiomyoma of uterus: Secondary | ICD-10-CM | POA: Insufficient documentation

## 2015-04-23 DIAGNOSIS — R938 Abnormal findings on diagnostic imaging of other specified body structures: Secondary | ICD-10-CM | POA: Insufficient documentation

## 2015-04-23 NOTE — Telephone Encounter (Signed)
Message left to call back for test result.  Blue team,  Please schedule patient for endometrial biopsy at the [facility] clinic. I advised her to schedule appointment on the day of her last visit but she didn't. Please call patient to help schedule. Thanks.  Dr Johnette Abraham.

## 2015-04-24 ENCOUNTER — Telehealth: Payer: Self-pay | Admitting: Obstetrics and Gynecology

## 2015-04-24 NOTE — Telephone Encounter (Signed)
Pt is returning the doctor's call. jw

## 2015-04-24 NOTE — Telephone Encounter (Signed)
Spoke with patient and relayed message from MD that u/s showed thickening in the uterus and that she would need an endometrial bx to determine the cause.  Patient voiced understanding and appt schedule for 04/30/15 on colpo clinic. Savannah Burns,CMA

## 2015-04-24 NOTE — Telephone Encounter (Signed)
Please see phone note from 04/23/15. Torsten Weniger,CMA

## 2015-04-24 NOTE — Telephone Encounter (Signed)
I again contacted patient about her test result but I was unable to reach her. Message left to call me back. I will mail her result note today since she is not returning or answering calls.

## 2015-04-30 ENCOUNTER — Ambulatory Visit (INDEPENDENT_AMBULATORY_CARE_PROVIDER_SITE_OTHER): Payer: Medicare Other | Admitting: Family Medicine

## 2015-04-30 VITALS — BP 138/61 | HR 90 | Temp 98.5°F | Ht 62.0 in | Wt 258.0 lb

## 2015-04-30 DIAGNOSIS — N950 Postmenopausal bleeding: Secondary | ICD-10-CM | POA: Diagnosis not present

## 2015-04-30 DIAGNOSIS — R300 Dysuria: Secondary | ICD-10-CM

## 2015-04-30 DIAGNOSIS — N8500 Endometrial hyperplasia, unspecified: Secondary | ICD-10-CM | POA: Diagnosis not present

## 2015-04-30 LAB — POCT UA - MICROSCOPIC ONLY: WBC, Ur, HPF, POC: >20

## 2015-04-30 LAB — POCT URINALYSIS DIPSTICK
BILIRUBIN UA: NEGATIVE
Glucose, UA: NEGATIVE
Ketones, UA: NEGATIVE
NITRITE UA: POSITIVE
PH UA: 7
Protein, UA: NEGATIVE
Spec Grav, UA: 1.02
Urobilinogen, UA: 0.2

## 2015-04-30 MED ORDER — CEPHALEXIN 500 MG PO CAPS: 500.0000 mg | ORAL_CAPSULE | Freq: Two times a day (BID) | ORAL | Status: DC

## 2015-04-30 NOTE — Progress Notes (Signed)
Patient ID: Savannah Burns, female   DOB: [birth year], 65 y.o.   MRN: [id number] Endometrial Biopsy Procedure Note Also complaint of dysuria Pre-operative Diagnosis: Post menopausal bleed. Thickened endometrial wall. Hx of endometriosis. Since last visit she has not had any bleeding.  Post-operative Diagnosis: same, endometrial biopsy attempted however we were unable to pass the uterine sound through the cervical Os. She likely has cervical stenosis.  Indications: postmenopausal bleeding Review of Systems  Constitutional: Negative.   Respiratory: Negative.   Cardiovascular: Negative.   Gastrointestinal: Negative.   Genitourinary: Positive for dysuria.       Vaginal bleed  All other systems reviewed and are negative.    Physical Exam  Constitutional: She is oriented to person, place, and time. She appears well-developed. No distress.  Genitourinary: Pelvic exam was performed with patient supine. Cervix exhibits no motion tenderness, no discharge and no friability. Right adnexum displays no mass and no tenderness. Left adnexum displays no mass and no tenderness. No erythema, tenderness or bleeding in the vagina. No foreign body around the vagina. No signs of injury around the vagina. No vaginal discharge found.  Neurological: She is alert and oriented to person, place, and time.  Nursing note and vitals reviewed.    Procedure Details   Urine pregnancy test was not done.  The risks (including infection, bleeding, pain, and uterine perforation) and benefits of the procedure were explained to the patient and Written informed consent was obtained.  Antibiotic prophylaxis against endocarditis was not indicated.   The patient was placed in the dorsal lithotomy position.  Bimanual exam showed the uterus to be in the anteroflexed position.  A Graves' speculum inserted in the vagina, and the cervix prepped with povidone iodine. Attempt made to pass the uterine sound but was unsuccessful.   Endocervical curettage with a Kevorkian curette was not performed.   A sharp tenaculum was applied to the anterior lip of the cervix for stabilization.  A sterile uterine sound was used to sound the uterus to a depth of 2cm.  Procedure was aborted.  Condition: Stable  Complications: Mild pain. Ibuprofen 400 recommended for pain to be given at this visit..  Plan:  The patient was advised to call for any fever or for prolonged or severe pain or bleeding. She was advised to use NSAID as needed for mild to moderate pain. She was advised to avoid vaginal intercourse for 48 hours or until the bleeding has completely stopped.  Attending Physician Documentation: I was present for or participated in the entire procedure, including opening and closing. Procedure attempted with Dr. Cyndia Skeeters.    Dysuria: UA shows + Nitrite and leukocyte. Urine sent to lab for culture. She was started on Keflex in the interim.

## 2015-04-30 NOTE — Patient Instructions (Signed)
Postmenopausal Bleeding Postmenopausal bleeding is any bleeding after menopause. Menopause is when a woman's period stops. Any type of bleeding after menopause is concerning. It should be checked by your doctor. Any treatment will depend on the cause. HOME CARE Watch your condition for any changes.  Avoid the use of tampons and douches as told by your doctor.  Change your pads often.  Get regular pelvic exams and Pap tests.  Keep all appointments for tests as told by your doctor. GET HELP IF:  Your bleeding lasts for more than 1 week.  You have belly (abdominal) pain.  You have bleeding after sex (intercourse). GET HELP RIGHT AWAY IF:   You have a fever, chills, a headache, dizziness, muscle aches, and bleeding.  You have strong pain with bleeding.  You have clumps of blood (blood clots) coming from your vagina.  You have bleeding and need more than 1 pad an hour.  You feel like you are going to pass out (faint). MAKE SURE YOU:  Understand these instructions.  Will watch your condition.  Will get help right away if you are not doing well or get worse.   This information is not intended to replace advice given to you by your health care provider. Make sure you discuss any questions you have with your health care provider.   Document Released: 12/22/2007 Document Revised: 03/19/2013 Document Reviewed: 10/11/2012 Elsevier Interactive Patient Education [employer].

## 2015-04-30 NOTE — Addendum Note (Signed)
Addended by: [place] Pink on: 04/30/2015 02:00 PM   Modules accepted: Orders

## 2015-05-03 LAB — URINE CULTURE: Colony Count: >=100000

## 2015-05-05 ENCOUNTER — Telehealth: Payer: Self-pay | Admitting: Family Medicine

## 2015-05-05 NOTE — Telephone Encounter (Signed)
Call back for test result

## 2015-05-06 ENCOUNTER — Telehealth: Payer: Self-pay | Admitting: Obstetrics and Gynecology

## 2015-05-06 ENCOUNTER — Encounter: Payer: Self-pay | Admitting: Obstetrics & Gynecology

## 2015-05-06 NOTE — Telephone Encounter (Signed)
Pt is calling because she is having a bad reaction to the Keflex. Can we call in something else that is similar. Jw

## 2015-05-07 MED ORDER — CIPROFLOXACIN HCL 500 MG PO TABS: 500.0000 mg | ORAL_TABLET | Freq: Two times a day (BID) | ORAL | Status: DC

## 2015-05-07 NOTE — Telephone Encounter (Signed)
Spoke with patient she states that she has 9 pills left.  She was experiencing bad diarrhea so she was only taking 1 tablet a day.  Patient is unable to come into the office until after next Wednesday to be re-evaluated.  She would like to know the results from her urine culture.  She said that she was going to find out if this medication would even treat the infection.  Jazmin Hartsell,CMA

## 2015-05-07 NOTE — Telephone Encounter (Signed)
LM for patient ok per DPR.  Will need to ask her questions regarding her medication and schedule her for a same day appt to reassess her urine. Kirsten Spearing,CMA

## 2015-05-07 NOTE — Telephone Encounter (Signed)
Will forward to Dr. Gwendlyn Deutscher who started patient on this medication. Jazmin Hartsell,CMA

## 2015-05-07 NOTE — Telephone Encounter (Signed)
I have e-prescribed Ciprofloxacin

## 2015-05-07 NOTE — Telephone Encounter (Signed)
Please call patient. 1. Ask how many tablets she have left. 2. Inform her her last dose was supposed to be yesterday. 3. Return to clinic for repeat UA to assess for clearance of her infection.

## 2015-06-01 ENCOUNTER — Ambulatory Visit (INDEPENDENT_AMBULATORY_CARE_PROVIDER_SITE_OTHER): Payer: Medicare Other | Admitting: Obstetrics & Gynecology

## 2015-06-01 ENCOUNTER — Encounter: Payer: Self-pay | Admitting: Obstetrics & Gynecology

## 2015-06-01 VITALS — BP 144/73 | HR 72 | Temp 98.4°F | Ht 61.0 in | Wt 258.7 lb

## 2015-06-01 DIAGNOSIS — N950 Postmenopausal bleeding: Secondary | ICD-10-CM | POA: Diagnosis not present

## 2015-06-01 MED ORDER — MISOPROSTOL 200 MCG PO TABS: 200.0000 ug | ORAL_TABLET | Freq: Once | ORAL | Status: DC

## 2015-06-01 NOTE — Progress Notes (Signed)
Patient ID: Savannah Burns, female   DOB: 1950-08-11, 65 y.o.   MRN: [id number]  Chief Complaint  Patient presents with  . Endometrial Biopsy  2 attempt to get biopsy at [facility] failed due to stenosis  HPI Savannah Burns is a 65 y.o. female.  No obstetric history on file. Normal pap, h/o episode of postmenopausal bleeding 2 months ago, menopause at age 57. No more bleeding.  HPI  Past Medical History  Diagnosis Date  . Hyperlipidemia   . Hypertension   . COPD (chronic obstructive pulmonary disease) ([place])   . Depression   . Diabetes mellitus without complication [facility])     Past Surgical History  Procedure Laterality Date  . Spine surgery    . Cesarean section      Family History  Problem Relation Age of Onset  . Diabetes Father   . Heart disease Father   . Hypertension Father     Social History Social History  Substance Use Topics  . Smoking status: Current Every Day Smoker -- 1.00 packs/day for 40 years    Types: Cigarettes  . Smokeless tobacco: None  . Alcohol Use: No    No Known Allergies  Current Outpatient Prescriptions  Medication Sig Dispense Refill  . acetic acid-hydrocortisone (VOSOL-HC) otic solution Place 3 drops into both ears 2 (two) times daily. 10 mL 5  . Blood Glucose Calibration (GLUCOMETER DEX HIGH CONTROL) LIQD Test CBG's once daily.     . citalopram (CELEXA) 40 MG tablet TAKE ONE TABLET BY MOUTH EVERY DAY 30 tablet 2  . furosemide (LASIX) 20 MG tablet Take 3 tablets (60 mg total) by mouth daily. 2 tablets in the morning and one at night. 90 tablet 3  . glucose blood (GLUCOMETER DEX TEST SENSORS) test strip as directed. Use as instructed     . HYDROcodone-acetaminophen (NORCO) 10-325 MG per tablet Take 1 tablet by mouth every 8 (eight) hours as needed.    Marland Kitchen lisinopril (PRINIVIL,ZESTRIL) 10 MG tablet TAKE ONE TABLET BY MOUTH ONCE DAILY 90 tablet 3  . lovastatin (MEVACOR) 20 MG tablet Take 1 tablet (20 mg total) by mouth at bedtime. 90 tablet 3   . metFORMIN (GLUCOPHAGE) 500 MG tablet Take 1 tablet (500 mg total) by mouth 2 (two) times daily with a meal. 90 tablet 4  . amoxicillin (AMOXIL) 875 MG tablet Take 1 tablet (875 mg total) by mouth 2 (two) times daily. (Patient not taking: Reported on 06/01/2015) 20 tablet 0  . aspirin 81 MG tablet Take 81 mg by mouth daily. Reported on 06/01/2015    . buPROPion (WELLBUTRIN) 75 MG tablet Take 1 tablet (75 mg total) by mouth 2 (two) times daily. (Patient not taking: Reported on 06/27/2014) 60 tablet 5  . cephALEXin (KEFLEX) 500 MG capsule Take 1 capsule (500 mg total) by mouth 2 (two) times daily. (Patient not taking: Reported on 06/01/2015) 14 capsule 0  . ciprofloxacin (CIPRO) 500 MG tablet Take 1 tablet (500 mg total) by mouth 2 (two) times daily. (Patient not taking: Reported on 06/01/2015) 6 tablet 0  . doxycycline (VIBRA-TABS) 100 MG tablet Take 1 tablet (100 mg total) by mouth 2 (two) times daily. (Patient not taking: Reported on 06/01/2015) 28 tablet 0  . fluticasone (FLONASE) 50 MCG/ACT nasal spray Place 2 sprays into both nostrils daily. (Patient not taking: Reported on 06/01/2015) 16 g 3  . mometasone (NASONEX) 50 MCG/ACT nasal spray Place 2 sprays into the nose daily. 17 g 12  . neomycin-polymyxin-hydrocortisone (  CORTISPORIN) otic solution Place 4 drops into the right ear 4 (four) times daily. (Patient not taking: Reported on 06/01/2015) 10 mL 0  . nicotine (NICODERM CQ - DOSED IN MG/24 HOURS) 14 mg/24hr patch Place 1 patch (14 mg total) onto the skin daily. (Patient not taking: Reported on 06/27/2014) 28 patch 0  . Tdap (BOOSTRIX) 5-2.5-18.5 LF-MCG/0.5 injection Inject 0.5 mLs into the muscle once. (Patient not taking: Reported on 06/27/2014) 0.5 mL 0   No current facility-administered medications for this visit.    Review of Systems Review of Systems  Constitutional: Negative.   Gastrointestinal: Negative.   Genitourinary: Negative for vaginal bleeding, vaginal discharge, menstrual problem and pelvic  pain.    Blood pressure 144/73, pulse 72, temperature 98.4 F (36.9 C), temperature source Oral, height 5' 1"  (1.549 m), weight 258 lb 11.2 oz (117.346 kg).  Physical Exam Physical Exam  Constitutional: She is oriented to person, place, and time. She appears well-developed. No distress.  Pulmonary/Chest: Effort normal.  Genitourinary: No vaginal discharge found.  Vagina atrophic, cervix grossly normal with stenotic os  Neurological: She is alert and oriented to person, place, and time.  Psychiatric: She has a normal mood and affect.    Data Reviewed Pap result [place] result 6+ mm thick endometrium  Assessment    Episode of PMP bleeding 2 month ago Abnl endometrial thickness for age   Possible cervical stenosis, 2 failed attempts for biopsy at [facility], did not attempt today Needs cervical ripening  Plan    Cytotec 200 mg per vagina before scheduled endometrial biopsy in office, take NSAD that morning        Savannah Burns 06/01/2015, 4:16 PM

## 2015-06-01 NOTE — Patient Instructions (Signed)
Endometrial Biopsy Endometrial biopsy is a procedure in which a tissue sample is taken from inside the uterus. The tissue sample is then looked at under a microscope to see if the tissue is normal or abnormal. The endometrium is the lining of the uterus. This procedure helps determine where you are in your menstrual cycle and how hormone levels are affecting the lining of the uterus. This procedure may also be used to evaluate uterine bleeding or to diagnose endometrial cancer, tuberculosis, polyps, or inflammatory conditions.  LET [facility] CARE PROVIDER KNOW ABOUT:  Any allergies you have.  All medicines you are taking, including vitamins, herbs, eye drops, creams, and over-the-counter medicines.  Previous problems you or members of your family have had with the use of anesthetics.  Any blood disorders you have.  Previous surgeries you have had.  Medical conditions you have.  Possibility of pregnancy. RISKS AND COMPLICATIONS Generally, this is a safe procedure. However, as with any procedure, complications can occur. Possible complications include:  Bleeding.  Pelvic infection.  Puncture of the uterine wall with the biopsy device (rare). BEFORE THE PROCEDURE   Keep a record of your menstrual cycles as directed by your health care provider. You may need to schedule your procedure for a specific time in your cycle.  You may want to bring a sanitary pad to wear home after the procedure.  Arrange for someone to drive you home after the procedure if you will be given a medicine to help you relax (sedative). PROCEDURE   You may be given a sedative to relax you.  You will lie on an exam table with your feet and legs supported as in a pelvic exam.  Your health care provider will insert an instrument (speculum) into your vagina to see your cervix.  Your cervix will be cleansed with an antiseptic solution. A medicine (local anesthetic) will be used to numb the cervix.  A forceps  instrument (tenaculum) will be used to hold your cervix steady for the biopsy.  A thin, rodlike instrument (uterine sound) will be inserted through your cervix to determine the length of your uterus and the location where the biopsy sample will be removed.  A thin, flexible tube (catheter) will be inserted through your cervix and into the uterus. The catheter is used to collect the biopsy sample from your endometrial tissue.  The catheter and speculum will then be removed, and the tissue sample will be sent to a lab for examination. AFTER THE PROCEDURE  You will rest in a recovery area until you are ready to go home.  You may have mild cramping and a small amount of vaginal bleeding for a few days after the procedure. This is normal.  Make sure you find out how to get your test results.   This information is not intended to replace advice given to you by your health care provider. Make sure you discuss any questions you have with your health care provider.   Document Released: 07/15/2004 Document Revised: 11/14/2012 Document Reviewed: 08/29/2012 Elsevier Interactive Patient Education [employer].

## 2015-06-29 ENCOUNTER — Other Ambulatory Visit: Payer: Medicare Other | Admitting: Obstetrics & Gynecology

## 2015-06-29 ENCOUNTER — Encounter: Payer: Self-pay | Admitting: Family Medicine

## 2015-06-29 ENCOUNTER — Ambulatory Visit (INDEPENDENT_AMBULATORY_CARE_PROVIDER_SITE_OTHER): Payer: Medicare Other | Admitting: Family Medicine

## 2015-06-29 VITALS — BP 143/55 | HR 70 | Temp 97.8°F | Wt 264.5 lb

## 2015-06-29 DIAGNOSIS — I5032 Chronic diastolic (congestive) heart failure: Secondary | ICD-10-CM | POA: Insufficient documentation

## 2015-06-29 DIAGNOSIS — F17200 Nicotine dependence, unspecified, uncomplicated: Secondary | ICD-10-CM

## 2015-06-29 DIAGNOSIS — Z72820 Sleep deprivation: Secondary | ICD-10-CM

## 2015-06-29 DIAGNOSIS — G4719 Other hypersomnia: Secondary | ICD-10-CM

## 2015-06-29 DIAGNOSIS — J449 Chronic obstructive pulmonary disease, unspecified: Secondary | ICD-10-CM | POA: Diagnosis not present

## 2015-06-29 DIAGNOSIS — Z7689 Persons encountering health services in other specified circumstances: Secondary | ICD-10-CM | POA: Insufficient documentation

## 2015-06-29 DIAGNOSIS — G4730 Sleep apnea, unspecified: Secondary | ICD-10-CM | POA: Diagnosis not present

## 2015-06-29 NOTE — Assessment & Plan Note (Signed)
Sleep study ordered

## 2015-06-29 NOTE — Assessment & Plan Note (Signed)
Discussed diagnosis, mechanism of disease. She is currently on lasix, lisinopril. Discussed daily weights and need to monitor fluid (also instructed NOT to drink up to 1 gallon of water that she says she does fairly consistently).

## 2015-06-29 NOTE — Assessment & Plan Note (Signed)
Quit x 1.5 months now. Encouraged to continue this, follow up if needed or relapses and needs help. She seems determined to quit fully, has done this in the past for a prolonged time.

## 2015-06-29 NOTE — Patient Instructions (Signed)
Inhaler:  Pulmicort inhaler: use this every day regardless of how you feel Albuterol inhaler: use when short of breath  Heart: Measure weight every day, keep a log of this.   Keep up the good work with quitting smoking!  Referral to get a sleep study done, you should get a phone call, if you do not by next week please call us here so we can check on it.  Come back in about 1 month to see Dr. Gerarda Fraction.

## 2015-06-29 NOTE — Assessment & Plan Note (Signed)
Recently started on pulmicort, instructed on how to use inhaler in clinic today. She seems to be breathing normally though her O2 sat was 93%. Asked her to follow up in about 1 month.

## 2015-06-29 NOTE — Progress Notes (Signed)
   Subjective:    Patient ID: Savannah Burns, female    DOB: 04-09-1950, 65 y.o.   MRN: [id number]  HPI  CC: hospital follow up   # Pneumonia / fatigue:  February 17th, discharged 20th. Went back by ambulance on the 24th, discharged 27th  Treated as pneumonia. Had severe cough and shortness of breath second time.   Discharged on levaquin, thinks this was for about 7-10 days  Over past month has felt tired, wants to sleep all the time ROS: no fevers, no current cough, no shortness of breath  # COPD  Says she was never diagnosed with this until hospitalization  Breathing is "okay" currently  Was started on pulmicort  Quit smoking on admission in February ROS: not currently SOB  # Diastolic HF  Says she was never diagnosed with this until hospitalization  Has been on lasix "forever"  Gets intermittent leg swelling, not swollen currently  Not sure what her usual weight is, doesn't measure regularly  # Sleep apnea concern  Had been told in the past she needed a sleep study  She has daytime somnolence and falls asleep, loud snoring, fatigue.  Social Hx: 40 pack year history, quit date February 2017  Review of Systems   See HPI for ROS.   Past medical history, surgical, family, and social history reviewed and updated in the EMR as appropriate. Objective:  BP 143/55 mmHg  Pulse 70  Temp(Src) 97.8 F (36.6 C) (Oral)  Wt 264 lb 8 oz (119.976 kg) Vitals and nursing note reviewed  General: no apparent distress  CV: normal rate, regular rhythm, no murmur appreciated today Resp: clear to auscultation bilaterally, no wheezes, rhonchi or crackles, normal effort Extremities: no LE edema currently.  Assessment & Plan:  Chronic diastolic HF (heart failure) (HCC) Discussed diagnosis, mechanism of disease. She is currently on lasix, lisinopril. Discussed daily weights and need to monitor fluid (also instructed NOT to drink up to 1 gallon of water that she says she  does fairly consistently).  COPD (chronic obstructive pulmonary disease) ([place]) Recently started on pulmicort, instructed on how to use inhaler in clinic today. She seems to be breathing normally though her O2 sat was 93%. Asked her to follow up in about 1 month.   TOBACCO DEPENDENCE Quit x 1.5 months now. Encouraged to continue this, follow up if needed or relapses and needs help. She seems determined to quit fully, has done this in the past for a prolonged time.  Excessive daytime sleepiness Sleep study ordered.

## 2015-07-13 ENCOUNTER — Other Ambulatory Visit: Payer: Medicare Other | Admitting: Obstetrics & Gynecology

## 2015-07-18 ENCOUNTER — Other Ambulatory Visit: Payer: Self-pay | Admitting: Obstetrics and Gynecology

## 2015-07-20 NOTE — Telephone Encounter (Signed)
Rx filled.  Algis Greenhouse. Jerline Pain, [place] Resident PGY-2 07/20/2015 12:12 PM

## 2015-07-29 ENCOUNTER — Ambulatory Visit (INDEPENDENT_AMBULATORY_CARE_PROVIDER_SITE_OTHER): Payer: Medicare Other | Admitting: Family Medicine

## 2015-07-29 VITALS — BP 141/56 | HR 76 | Temp 99.1°F | Wt 273.2 lb

## 2015-07-29 DIAGNOSIS — R5382 Chronic fatigue, unspecified: Secondary | ICD-10-CM | POA: Diagnosis not present

## 2015-07-29 DIAGNOSIS — E559 Vitamin D deficiency, unspecified: Secondary | ICD-10-CM

## 2015-07-29 DIAGNOSIS — R5383 Other fatigue: Secondary | ICD-10-CM

## 2015-07-29 LAB — BASIC METABOLIC PANEL WITH GFR
BUN: 20 mg/dL (ref 7–25)
CHLORIDE: 100 mmol/L (ref 98–110)
CO2: 28 mmol/L (ref 20–31)
Calcium: 9.4 mg/dL (ref 8.6–10.4)
Creat: 0.71 mg/dL (ref 0.50–0.99)
GFR, Est African American: >89 mL/min (ref >=60)
Glucose, Bld: 102 mg/dL — ABNORMAL HIGH (ref 65–99)
POTASSIUM: 4.3 mmol/L (ref 3.5–5.3)
Sodium: 138 mmol/L (ref 135–146)

## 2015-07-29 LAB — CBC
HEMATOCRIT: 41.8 % (ref 35.0–45.0)
HEMOGLOBIN: 13.7 g/dL (ref 11.7–15.5)
MCH: 30.9 pg (ref 27.0–33.0)
MCHC: 32.8 g/dL (ref 32.0–36.0)
MCV: 94.4 fL (ref 80.0–100.0)
MPV: 11.9 fL (ref 7.5–12.5)
Platelets: 195 10*3/uL (ref 140–400)
RBC: 4.43 MIL/uL (ref 3.80–5.10)
RDW: 14 % (ref 11.0–15.0)
WBC: 7.8 10*3/uL (ref 3.8–10.8)

## 2015-07-29 LAB — TSH: TSH: 2.21 m[IU]/L

## 2015-07-29 MED ORDER — BUDESONIDE 90 MCG/ACT IN AEPB: 1.0000 | INHALATION_SPRAY | Freq: Two times a day (BID) | RESPIRATORY_TRACT | Status: DC

## 2015-07-29 MED ORDER — ALBUTEROL SULFATE HFA 108 (90 BASE) MCG/ACT IN AERS: 1.0000 | INHALATION_SPRAY | Freq: Four times a day (QID) | RESPIRATORY_TRACT | Status: DC | PRN

## 2015-07-29 NOTE — Patient Instructions (Addendum)
Schedule an appointment with the pharmacy clinic here, to get spirometry  We will do some lab testing  Keep appointment with sleep medicine doctor  Follow up with [place] after the sleep medicine doctor

## 2015-07-29 NOTE — Progress Notes (Signed)
   Subjective:    Patient ID: Savannah Burns, female    DOB: [birth year], 65 y.o.   MRN: [id number]  HPI  CC: follow up "pneumonia"  # Fatigue:  Has gained some weight. Trying to eat more vegetables. She has quit smoking. Sees about +/- 3LB swings. Doesn't notice much change with the weight  Feels primarily fatigued. This is every single day  She will at times sleep "all day" -- doing this at least 1-2 times a week. She is actually sleeping the whole day, not just tossing/turning. Other nights goes to bed at 11pm and wakes up around 9-10am.   Sleep study referral was made, seeing the doctor on June 8th.   Breathing is about the same. Feels like she is not doing as much activity. Gets SOB after walking about 50 feet, needs to take a few minutes to recover  Lasix taking 58m in morning and night. Sometimes misses doses at night.  ROS: no fevers, no chills, some weight gain as above, no abdominal pain, no chest pain, no dysuria  Social Hx: current smoker  Review of Systems   See HPI for ROS.   Past medical history, surgical, family, and social history reviewed and updated in the EMR as appropriate. Objective:  BP 141/56 mmHg  Pulse 76  Temp(Src) 99.1 F (37.3 C) (Oral)  Wt 273 lb 3.2 oz (123.923 kg) Vitals and nursing note reviewed  General: no apparent distress  CV: normal rate, regular rhythm  Resp: clear to auscultation bilaterally, normal effort. I do not appreciate crackles at the bases.  Extremities: she has 1-2+ pitting edema in her legs up to about mid-shin.  Assessment & Plan:  Fatigue Suspect this is multifactorial. Strongest contributors are probably her CHF, probable OSA, deconditioning/obesity. She has a sleep study appointment scheduled for June. Discussed additional lab work to rule out other causes: CBC, repeat TSH (normal in September), Vitamin D. Will follow up with results, otherwise follow up here after sleep study appointment.

## 2015-07-30 DIAGNOSIS — R5383 Other fatigue: Secondary | ICD-10-CM | POA: Insufficient documentation

## 2015-07-30 LAB — VITAMIN D 25 HYDROXY (VIT D DEFICIENCY, FRACTURES): VIT D 25 HYDROXY: 22 ng/mL — AB (ref 30–100)

## 2015-07-30 NOTE — Assessment & Plan Note (Signed)
Suspect this is multifactorial. Strongest contributors are probably her CHF, probable OSA, deconditioning/obesity. She has a sleep study appointment scheduled for June. Discussed additional lab work to rule out other causes: CBC, repeat TSH (normal in September), Vitamin D. Will follow up with results, otherwise follow up here after sleep study appointment.

## 2015-07-31 ENCOUNTER — Telehealth: Payer: Self-pay | Admitting: Family Medicine

## 2015-07-31 MED ORDER — VITAMIN D3 1.25 MG (50000 UT) PO TABS: 1.0000 | ORAL_TABLET | ORAL | Status: DC

## 2015-07-31 NOTE — Telephone Encounter (Signed)
Called and left VM. Results from lab testing was mostly normal, TSH and CBC were fine so no thyroid issue or anemia. Her vitamin D level did come back slightly normal at 22, with normal being above 30 and 20-30 being sometimes called an "intermediate" level. Given her symptoms I would recommend she do the vitamin D supplementation which will be 50,000 units once a week for 8 weeks, then about 1000-2000 units daily thereafter. At the 8 week mark she should get a repeat level to make sure she doesn't need to do any more of the high dose.   When patient calls back please relay this message. I have sent the vitamin D prescription to the pharmacy. -Dr. Lamar Benes

## 2015-08-06 ENCOUNTER — Ambulatory Visit: Payer: Medicare Other | Admitting: Pharmacist

## 2015-08-19 NOTE — Telephone Encounter (Signed)
Patient is aware of results and will call pharmacy to pick up medication.  Patient would like provider to know that she was originally scheduled for her sleep study on 09-03-15 but was called from [place] and they rescheduled her for august 2017.  Patient feels like this is too far off because she is having so many sleep issues. Savannah Burns,CMA

## 2015-08-21 ENCOUNTER — Encounter: Payer: Self-pay | Admitting: Pharmacist

## 2015-08-21 ENCOUNTER — Ambulatory Visit (INDEPENDENT_AMBULATORY_CARE_PROVIDER_SITE_OTHER): Payer: Medicare Other | Admitting: Pharmacist

## 2015-08-21 VITALS — Ht 62.0 in | Wt 274.4 lb

## 2015-08-21 DIAGNOSIS — F17200 Nicotine dependence, unspecified, uncomplicated: Secondary | ICD-10-CM

## 2015-08-21 DIAGNOSIS — J449 Chronic obstructive pulmonary disease, unspecified: Secondary | ICD-10-CM

## 2015-08-21 NOTE — Progress Notes (Signed)
S:    Patient arrives in good spirit, ambulating without a walker.    Presents for lung function evaluation.   Patient was referred on 08/19/2015.  Patient was last seen by Primary Care Provider on 07/29/2015.  Patient reports breathing has been normal with daily use of budesonide.  Patient reports that she was successful in smoking cessation. She has been tobacco free for 14 weeks.   O: mMRC score= 2 CAT score= 13 See Documentation Flowsheet - CAT/COPD for complete symptom scoring.  See "scanned report" or Documentation Flowsheet (discrete results - PFTs) for  Spirometry results. Patient provided good effort while attempting spirometry.   A/P: Spirometry evaluation with Pre Bronchodilator reveals normal lung function and CAT score of 13.  Patient provided good effort while attempting spirometry. Patient has been experiencing occasional cough and shortness of breath for few days and taking budesonide, continue taking budesonide twice daily treatment plan at this time.  Educated patient on purpose, proper use, potential adverse effects including risk of esophageal candidiasis and need to rinse mouth after each use.  Reviewed results of pulmonary function tests.  Pt verbalized understanding of results and education.  Encouraged continued abstinence from tobacco.  Written pt instructions provided.  F/U Clinic visit with Dr. Gerarda Fraction in 2 months.   Total time in face to face counseling 30 minutes.  Patient seen with Zettie Cooley, PharmD Candidate.

## 2015-08-21 NOTE — Patient Instructions (Addendum)
Thank you for coming in today.   Your PFT result was near normal.   Congratulations on quitting smoking. Keep up with the good work.   Follow up with Dr. Gerarda Fraction in 2 months.

## 2015-08-21 NOTE — Assessment & Plan Note (Signed)
Encouraged continued abstinence from tobacco.

## 2015-08-21 NOTE — Assessment & Plan Note (Signed)
Spirometry evaluation with Pre Bronchodilator reveals near normal lung function and CAT score of 13.  Patient provided good effort while attempting spirometry. Patient has been experiencing occasional cough and shortness of breath for few days and taking budesonide, continue taking budesonide twice daily treatment plan at this time.  Educated patient on purpose, proper use, potential adverse effects including risk of esophageal candidiasis and need to rinse mouth after each use.  Reviewed results of pulmonary function tests.  Pt verbalized understanding of results and education.  Encouraged continued abstinence from tobacco.  Written pt instructions provided.

## 2015-08-25 NOTE — Progress Notes (Signed)
Patient ID: Savannah Burns, female   DOB: [birth year], 65 y.o.   MRN: [id number] Reviewed: Agree with Dr. Graylin Shiver documentation and management.

## 2015-09-01 ENCOUNTER — Ambulatory Visit: Payer: Medicare Other | Admitting: *Deleted

## 2015-09-03 ENCOUNTER — Institutional Professional Consult (permissible substitution): Payer: Medicare Other | Admitting: Pulmonary Disease

## 2015-09-08 ENCOUNTER — Other Ambulatory Visit: Payer: Self-pay | Admitting: *Deleted

## 2015-09-08 ENCOUNTER — Ambulatory Visit (INDEPENDENT_AMBULATORY_CARE_PROVIDER_SITE_OTHER): Payer: Medicare Other | Admitting: *Deleted

## 2015-09-08 ENCOUNTER — Encounter: Payer: Self-pay | Admitting: *Deleted

## 2015-09-08 VITALS — BP 130/70 | HR 71 | Temp 98.5°F | Ht 62.0 in | Wt 279.6 lb

## 2015-09-08 DIAGNOSIS — Z1159 Encounter for screening for other viral diseases: Secondary | ICD-10-CM

## 2015-09-08 DIAGNOSIS — Z23 Encounter for immunization: Secondary | ICD-10-CM

## 2015-09-08 DIAGNOSIS — Z0000 Encounter for general adult medical examination without abnormal findings: Secondary | ICD-10-CM

## 2015-09-08 DIAGNOSIS — Z114 Encounter for screening for human immunodeficiency virus [HIV]: Secondary | ICD-10-CM | POA: Diagnosis not present

## 2015-09-08 NOTE — Patient Instructions (Signed)
Bone Densitometry Bone densitometry is an imaging test that uses a special X-ray to measure the amount of calcium and other minerals in your bones (bone density). This test is also known as a bone mineral density test or dual-energy X-ray absorptiometry (DXA). The test can measure bone density at your hip and your spine. It is similar to having a regular X-ray. You may have this test to:  Diagnose a condition that causes weak or thin bones (osteoporosis).  Predict your risk of a broken bone (fracture).  Determine how well osteoporosis treatment is working. LET [facility] CARE PROVIDER KNOW ABOUT:  Any allergies you have.  All medicines you are taking, including vitamins, herbs, eye drops, creams, and over-the-counter medicines.  Previous problems you or members of your family have had with the use of anesthetics.  Any blood disorders you have.  Previous surgeries you have had.  Medical conditions you have.  Possibility of pregnancy.  Any other medical test you had within the previous 14 days that used contrast material. RISKS AND COMPLICATIONS Generally, this is a safe procedure. However, problems can occur and may include the following:  This test exposes you to a very small amount of radiation.  The risks of radiation exposure may be greater to unborn children. BEFORE THE PROCEDURE  Do not take any calcium supplements for 24 hours before having the test. You can otherwise eat and drink what you usually do.  Take off all metal jewelry, eyeglasses, dental appliances, and any other metal objects. PROCEDURE  You may lie on an exam table. There will be an X-ray generator below you and an imaging device above you.  Other devices, such as boxes or braces, may be used to position your body properly for the scan.  You will need to lie still while the machine slowly scans your body.  The images will show up on a computer monitor. AFTER THE PROCEDURE You may need more testing  at a later time.   This information is not intended to replace advice given to you by your health care provider. Make sure you discuss any questions you have with your health care provider.   Document Released: 04/05/2004 Document Revised: 04/04/2014 Document Reviewed: 08/22/2013 Elsevier Interactive Patient Education 2016 [employer].  Colonoscopy A colonoscopy is an exam to look at the entire large intestine (colon). This exam can help find problems such as tumors, polyps, inflammation, and areas of bleeding. The exam takes about 1 hour.  LET [facility] CARE PROVIDER KNOW ABOUT:   Any allergies you have.  All medicines you are taking, including vitamins, herbs, eye drops, creams, and over-the-counter medicines.  Previous problems you or members of your family have had with the use of anesthetics.  Any blood disorders you have.  Previous surgeries you have had.  Medical conditions you have. RISKS AND COMPLICATIONS  Generally, this is a safe procedure. However, as with any procedure, complications can occur. Possible complications include:  Bleeding.  Tearing or rupture of the colon wall.  Reaction to medicines given during the exam.  Infection (rare). BEFORE THE PROCEDURE   Ask your health care provider about changing or stopping your regular medicines.  You may be prescribed an oral bowel prep. This involves drinking a large amount of medicated liquid, starting the day before your procedure. The liquid will cause you to have multiple loose stools until your stool is almost clear or light green. This cleans out your colon in preparation for the procedure.  Do  not eat or drink anything else once you have started the bowel prep, unless your health care provider tells you it is safe to do so.  Arrange for someone to drive you home after the procedure. PROCEDURE   You will be given medicine to help you relax (sedative).  You will lie on your side with your knees  bent.  A long, flexible tube with a light and camera on the end (colonoscope) will be inserted through the rectum and into the colon. The camera sends video back to a computer screen as it moves through the colon. The colonoscope also releases carbon dioxide gas to inflate the colon. This helps your health care provider see the area better.  During the exam, your health care provider may take a small tissue sample (biopsy) to be examined under a microscope if any abnormalities are found.  The exam is finished when the entire colon has been viewed. AFTER THE PROCEDURE   Do not drive for 24 hours after the exam.  You may have a small amount of blood in your stool.  You may pass moderate amounts of gas and have mild abdominal cramping or bloating. This is caused by the gas used to inflate your colon during the exam.  Ask when your test results will be ready and how you will get your results. Make sure you get your test results.   This information is not intended to replace advice given to you by your health care provider. Make sure you discuss any questions you have with your health care provider.   Document Released: 03/11/2000 Document Revised: 01/02/2013 Document Reviewed: 11/19/2012 Elsevier Interactive Patient Education 2016 Elsevier Inc.  Fat and Cholesterol Restricted Diet Getting too much fat and cholesterol in your diet may cause health problems. Following this diet helps keep your fat and cholesterol at normal levels. This can keep you from getting sick. WHAT TYPES OF FAT SHOULD I CHOOSE?  Choose monosaturated and polyunsaturated fats. These are found in foods such as olive oil, canola oil, flaxseeds, walnuts, almonds, and seeds.  Eat more omega-3 fats. Good choices include salmon, mackerel, sardines, tuna, flaxseed oil, and ground flaxseeds.  Limit saturated fats. These are in animal products such as meats, butter, and cream. They can also be in plant products such as palm oil,  palm kernel oil, and coconut oil.   Avoid foods with partially hydrogenated oils in them. These contain trans fats. Examples of foods that have trans fats are stick margarine, some tub margarines, cookies, crackers, and other baked goods. WHAT GENERAL GUIDELINES DO I NEED TO FOLLOW?   Check food labels. Look for the words "trans fat" and "saturated fat."  When preparing a meal:  Fill half of your plate with vegetables and green salads.  Fill one fourth of your plate with whole grains. Look for the word "whole" as the first word in the ingredient list.  Fill one fourth of your plate with lean protein foods.  Limit fruit to two servings a day. Choose fruit instead of juice.  Eat more foods with soluble fiber. Examples of foods with this type of fiber are apples, broccoli, carrots, beans, peas, and barley. Try to get 20-30 g (grams) of fiber per day.  Eat more home-cooked foods. Eat less at restaurants and buffets.  Limit or avoid alcohol.  Limit foods high in starch and sugar.  Limit fried foods.  Cook foods without frying them. Baking, boiling, grilling, and broiling are all great options.  Lose  weight if you are overweight. Losing even a small amount of weight can help your overall health. It can also help prevent diseases such as diabetes and heart disease. WHAT FOODS CAN I EAT? Grains Whole grains, such as whole wheat or whole grain breads, crackers, cereals, and pasta. Unsweetened oatmeal, bulgur, barley, quinoa, or brown rice. Corn or whole wheat flour tortillas. Vegetables Fresh or frozen vegetables (raw, steamed, roasted, or grilled). Green salads. Fruits All fresh, canned (in natural juice), or frozen fruits. Meat and Other Protein Products Ground beef (85% or leaner), grass-fed beef, or beef trimmed of fat. Skinless chicken or [place]. Ground chicken or [place]. Pork trimmed of fat. All fish and seafood. Eggs. Dried beans, peas, or lentils. Unsalted nuts or seeds. Unsalted  canned or dry beans. Dairy Low-fat dairy products, such as skim or 1% milk, 2% or reduced-fat cheeses, low-fat ricotta or cottage cheese, or plain low-fat yogurt. Fats and Oils Tub margarines without trans fats. Light or reduced-fat mayonnaise and salad dressings. Avocado. Olive, canola, sesame, or safflower oils. Natural peanut or almond butter (choose ones without added sugar and oil). The items listed above may not be a complete list of recommended foods or beverages. Contact your dietitian for more options. WHAT FOODS ARE NOT RECOMMENDED? Grains White bread. White pasta. White rice. Cornbread. Bagels, pastries, and croissants. Crackers that contain trans fat. Vegetables White potatoes. Corn. Creamed or fried vegetables. Vegetables in a cheese sauce. Fruits Dried fruits. Canned fruit in light or heavy syrup. Fruit juice. Meat and Other Protein Products Fatty cuts of meat. Ribs, chicken wings, bacon, sausage, bologna, salami, chitterlings, fatback, hot dogs, bratwurst, and packaged luncheon meats. Liver and organ meats. Dairy Whole or 2% milk, cream, half-and-half, and cream cheese. Whole milk cheeses. Whole-fat or sweetened yogurt. Full-fat cheeses. Nondairy creamers and whipped toppings. Processed cheese, cheese spreads, or cheese curds. Sweets and Desserts Corn syrup, sugars, honey, and molasses. Candy. Jam and jelly. Syrup. Sweetened cereals. Cookies, pies, cakes, donuts, muffins, and ice cream. Fats and Oils Butter, stick margarine, lard, shortening, ghee, or bacon fat. Coconut, palm kernel, or palm oils. Beverages Alcohol. Sweetened drinks (such as sodas, lemonade, and fruit drinks or punches). The items listed above may not be a complete list of foods and beverages to avoid. Contact your dietitian for more information.   This information is not intended to replace advice given to you by your health care provider. Make sure you discuss any questions you have with your health care  provider.   Document Released: 09/13/2011 Document Revised: 04/04/2014 Document Reviewed: 06/13/2013 Elsevier Interactive Patient Education 2016 [place].  Diabetes and Foot Care Diabetes may cause you to have problems because of poor blood supply (circulation) to your feet and legs. This may cause the skin on your feet to become thinner, break easier, and heal more slowly. Your skin may become dry, and the skin may peel and crack. You may also have nerve damage in your legs and feet causing decreased feeling in them. You may not notice minor injuries to your feet that could lead to infections or more serious problems. Taking care of your feet is one of the most important things you can do for yourself.  HOME CARE INSTRUCTIONS  Wear shoes at all times, even in the house. Do not go barefoot. Bare feet are easily injured.  Check your feet daily for blisters, cuts, and redness. If you cannot see the bottom of your feet, use a mirror or ask someone for help.  Wash your feet with warm water (do not use hot water) and mild soap. Then pat your feet and the areas between your toes until they are completely dry. Do not soak your feet as this can dry your skin.  Apply a moisturizing lotion or petroleum jelly (that does not contain alcohol and is unscented) to the skin on your feet and to dry, brittle toenails. Do not apply lotion between your toes.  Trim your toenails straight across. Do not dig under them or around the cuticle. File the edges of your nails with an emery board or nail file.  Do not cut corns or calluses or try to remove them with medicine.  Wear clean socks or stockings every day. Make sure they are not too tight. Do not wear knee-high stockings since they may decrease blood flow to your legs.  Wear shoes that fit properly and have enough cushioning. To break in new shoes, wear them for just a few hours a day. This prevents you from injuring your feet. Always look in your shoes  before you put them on to be sure there are no objects inside.  Do not cross your legs. This may decrease the blood flow to your feet.  If you find a minor scrape, cut, or break in the skin on your feet, keep it and the skin around it clean and dry. These areas may be cleansed with mild soap and water. Do not cleanse the area with peroxide, alcohol, or iodine.  When you remove an adhesive bandage, be sure not to damage the skin around it.  If you have a wound, look at it several times a day to make sure it is healing.  Do not use heating pads or hot water bottles. They may burn your skin. If you have lost feeling in your feet or legs, you may not know it is happening until it is too late.  Make sure your health care provider performs a complete foot exam at least annually or more often if you have foot problems. Report any cuts, sores, or bruises to your health care provider immediately. SEEK MEDICAL CARE IF:   You have an injury that is not healing.  You have cuts or breaks in the skin.  You have an ingrown nail.  You notice redness on your legs or feet.  You feel burning or tingling in your legs or feet.  You have pain or cramps in your legs and feet.  Your legs or feet are numb.  Your feet always feel cold. SEEK IMMEDIATE MEDICAL CARE IF:   There is increasing redness, swelling, or pain in or around a wound.  There is a red line that goes up your leg.  Pus is coming from a wound.  You develop a fever or as directed by your health care provider.  You notice a bad smell coming from an ulcer or wound.   This information is not intended to replace advice given to you by your health care provider. Make sure you discuss any questions you have with your health care provider.   Document Released: 03/11/2000 Document Revised: 11/14/2012 Document Reviewed: 08/21/2012 Elsevier Interactive Patient Education 2016 [place] in the Home  Falls can cause  injuries. They can happen to people of all ages. There are many things you can do to make your home safe and to help prevent falls.  WHAT CAN I DO ON THE OUTSIDE OF MY HOME?  Regularly fix the edges  of walkways and driveways and fix any cracks.  Remove anything that might make you trip as you walk through a door, such as a raised step or threshold.  Trim any bushes or trees on the path to your home.  Use bright outdoor lighting.  Clear any walking paths of anything that might make someone trip, such as rocks or tools.  Regularly check to see if handrails are loose or broken. Make sure that both sides of any steps have handrails.  Any raised decks and porches should have guardrails on the edges.  Have any leaves, snow, or ice cleared regularly.  Use sand or salt on walking paths during winter.  Clean up any spills in your garage right away. This includes oil or grease spills. WHAT CAN I DO IN THE BATHROOM?   Use night lights.  Install grab bars by the toilet and in the tub and shower. Do not use towel bars as grab bars.  Use non-skid mats or decals in the tub or shower.  If you need to sit down in the shower, use a plastic, non-slip stool.  Keep the floor dry. Clean up any water that spills on the floor as soon as it happens.  Remove soap buildup in the tub or shower regularly.  Attach bath mats securely with double-sided non-slip rug tape.  Do not have throw rugs and other things on the floor that can make you trip. WHAT CAN I DO IN THE BEDROOM?  Use night lights.  Make sure that you have a light by your bed that is easy to reach.  Do not use any sheets or blankets that are too big for your bed. They should not hang down onto the floor.  Have a firm chair that has side arms. You can use this for support while you get dressed.  Do not have throw rugs and other things on the floor that can make you trip. WHAT CAN I DO IN THE KITCHEN?  Clean up any spills right  away.  Avoid walking on wet floors.  Keep items that you use a lot in easy-to-reach places.  If you need to reach something above you, use a strong step stool that has a grab bar.  Keep electrical cords out of the way.  Do not use floor polish or wax that makes floors slippery. If you must use wax, use non-skid floor wax.  Do not have throw rugs and other things on the floor that can make you trip. WHAT CAN I DO WITH MY STAIRS?  Do not leave any items on the stairs.  Make sure that there are handrails on both sides of the stairs and use them. Fix handrails that are broken or loose. Make sure that handrails are as long as the stairways.  Check any carpeting to make sure that it is firmly attached to the stairs. Fix any carpet that is loose or worn.  Avoid having throw rugs at the top or bottom of the stairs. If you do have throw rugs, attach them to the floor with carpet tape.  Make sure that you have a light switch at the top of the stairs and the bottom of the stairs. If you do not have them, ask someone to add them for you. WHAT ELSE CAN I DO TO HELP PREVENT FALLS?  Wear shoes that:  Do not have high heels.  Have rubber bottoms.  Are comfortable and fit you well.  Are closed at the toe. Do not  wear sandals.  If you use a stepladder:  Make sure that it is fully opened. Do not climb a closed stepladder.  Make sure that both sides of the stepladder are locked into place.  Ask someone to hold it for you, if possible.  Clearly mark and make sure that you can see:  Any grab bars or handrails.  First and last steps.  Where the edge of each step is.  Use tools that help you move around (mobility aids) if they are needed. These include:  Canes.  Walkers.  Scooters.  Crutches.  Turn on the lights when you go into a dark area. Replace any light bulbs as soon as they burn out.  Set up your furniture so you have a clear path. Avoid moving your furniture  around.  If any of your floors are uneven, fix them.  If there are any pets around you, be aware of where they are.  Review your medicines with your doctor. Some medicines can make you feel dizzy. This can increase your chance of falling. Ask your doctor what other things that you can do to help prevent falls.   This information is not intended to replace advice given to you by your health care provider. Make sure you discuss any questions you have with your health care provider.   Document Released: 01/08/2009 Document Revised: 07/29/2014 Document Reviewed: 04/18/2014 Elsevier Interactive Patient Education 2016 Elsevier Inc.  Glaucoma Glaucoma happens when the fluid pressure in the eyeball is too high. If the pressure stays high for too long, the eye may become damaged. This can cause a loss of vision. The most common type of glaucoma causes pressure in the eye to go up slowly. There may be no symptoms at first. Testing for this condition can help to find the condition before damage occurs. Early treatment can often stop vision loss. HOME CARE  Take medicines only as told by your doctor.  Use your eye drops exactly as told. You will probably need to use these for the rest of your life.  Exercise often. Talk with your doctor about which types of exercise are safe for you. Avoid standing on your head.  Keep all follow-up visits as told by your doctor. This is important. GET HELP IF:  Your symptoms get worse. GET HELP RIGHT AWAY IF:  You have bad pain in your eye.  You have vision problems.  You have a bad headache in the area around your eye.  You feel sick to your stomach (nauseous) or you throw up (vomit).  You start to have problems with your other eye.   This information is not intended to replace advice given to you by your health care provider. Make sure you discuss any questions you have with your health care provider.   Document Released: 12/22/2007 Document Revised:  04/04/2014 Document Reviewed: 12/24/2013 Elsevier Interactive Patient Education 2016 [place] Maintenance, Female Adopting a healthy lifestyle and getting preventive care can go a long way to promote health and wellness. Talk with your health care provider about what schedule of regular examinations is right for you. This is a good chance for you to check in with your provider about disease prevention and staying healthy. In between checkups, there are plenty of things you can do on your own. Experts have done a lot of research about which lifestyle changes and preventive measures are most likely to keep you healthy. Ask your health care provider for more information. WEIGHT AND  DIET  Eat a healthy diet  Be sure to include plenty of vegetables, fruits, low-fat dairy products, and lean protein.  Do not eat a lot of foods high in solid fats, added sugars, or salt.  Get regular exercise. This is one of the most important things you can do for your health.  Most adults should exercise for at least 150 minutes each week. The exercise should increase your heart rate and make you sweat (moderate-intensity exercise).  Most adults should also do strengthening exercises at least twice a week. This is in addition to the moderate-intensity exercise.  Maintain a healthy weight  Body mass index (BMI) is a measurement that can be used to identify possible weight problems. It estimates body fat based on height and weight. Your health care provider can help determine your BMI and help you achieve or maintain a healthy weight.  For females 57 years of age and older:   A BMI below 18.5 is considered underweight.  A BMI of 18.5 to 24.9 is normal.  A BMI of 25 to 29.9 is considered overweight.  A BMI of 30 and above is considered obese.  Watch levels of cholesterol and blood lipids  You should start having your blood tested for lipids and cholesterol at 66 years of age, then have this  test every 5 years.  You may need to have your cholesterol levels checked more often if:  Your lipid or cholesterol levels are high.  You are older than 66 years of age.  You are at high risk for heart disease.  CANCER SCREENING   Lung Cancer  Lung cancer screening is recommended for adults 14-72 years old who are at high risk for lung cancer because of a history of smoking.  A yearly low-dose CT scan of the lungs is recommended for people who:  Currently smoke.  Have quit within the past 15 years.  Have at least a 30-pack-year history of smoking. A pack year is smoking an average of one pack of cigarettes a day for 1 year.  Yearly screening should continue until it has been 15 years since you quit.  Yearly screening should stop if you develop a health problem that would prevent you from having lung cancer treatment.  Breast Cancer  Practice breast self-awareness. This means understanding how your breasts normally appear and feel.  It also means doing regular breast self-exams. Let your health care provider know about any changes, no matter how small.  If you are in your 20s or 30s, you should have a clinical breast exam (CBE) by a health care provider every 1-3 years as part of a regular health exam.  If you are 55 or older, have a CBE every year. Also consider having a breast X-ray (mammogram) every year.  If you have a family history of breast cancer, talk to your health care provider about genetic screening.  If you are at high risk for breast cancer, talk to your health care provider about having an MRI and a mammogram every year.  Breast cancer gene (BRCA) assessment is recommended for women who have family members with BRCA-related cancers. BRCA-related cancers include:  Breast.  Ovarian.  Tubal.  Peritoneal cancers.  Results of the assessment will determine the need for genetic counseling and BRCA1 and BRCA2 testing. Cervical Cancer Your health care  provider may recommend that you be screened regularly for cancer of the pelvic organs (ovaries, uterus, and vagina). This screening involves a pelvic examination, including checking for  microscopic changes to the surface of your cervix (Pap test). You may be encouraged to have this screening done every 3 years, beginning at age 53.  For women ages 17-65, health care providers may recommend pelvic exams and Pap testing every 3 years, or they may recommend the Pap and pelvic exam, combined with testing for human papilloma virus (HPV), every 5 years. Some types of HPV increase your risk of cervical cancer. Testing for HPV may also be done on women of any age with unclear Pap test results.  Other health care providers may not recommend any screening for nonpregnant women who are considered low risk for pelvic cancer and who do not have symptoms. Ask your health care provider if a screening pelvic exam is right for you.  If you have had past treatment for cervical cancer or a condition that could lead to cancer, you need Pap tests and screening for cancer for at least 20 years after your treatment. If Pap tests have been discontinued, your risk factors (such as having a new sexual partner) need to be reassessed to determine if screening should resume. Some women have medical problems that increase the chance of getting cervical cancer. In these cases, your health care provider may recommend more frequent screening and Pap tests. Colorectal Cancer  This type of cancer can be detected and often prevented.  Routine colorectal cancer screening usually begins at 66 years of age and continues through 66 years of age.  Your health care provider may recommend screening at an earlier age if you have risk factors for colon cancer.  Your health care provider may also recommend using home test kits to check for hidden blood in the stool.  A small camera at the end of a tube can be used to examine your colon directly  (sigmoidoscopy or colonoscopy). This is done to check for the earliest forms of colorectal cancer.  Routine screening usually begins at age 32.  Direct examination of the colon should be repeated every 5-10 years through 66 years of age. However, you may need to be screened more often if early forms of precancerous polyps or small growths are found. Skin Cancer  Check your skin from head to toe regularly.  Tell your health care provider about any new moles or changes in moles, especially if there is a change in a mole's shape or color.  Also tell your health care provider if you have a mole that is larger than the size of a pencil eraser.  Always use sunscreen. Apply sunscreen liberally and repeatedly throughout the day.  Protect yourself by wearing long sleeves, pants, a wide-brimmed hat, and sunglasses whenever you are outside. HEART DISEASE, DIABETES, AND HIGH BLOOD PRESSURE   High blood pressure causes heart disease and increases the risk of stroke. High blood pressure is more likely to develop in:  People who have blood pressure in the high end of the normal range (130-139/85-89 mm Hg).  People who are overweight or obese.  People who are African American.  If you are 87-63 years of age, have your blood pressure checked every 3-5 years. If you are 42 years of age or older, have your blood pressure checked every year. You should have your blood pressure measured twice--once when you are at a hospital or clinic, and once when you are not at a hospital or clinic. Record the average of the two measurements. To check your blood pressure when you are not at a hospital or clinic, you  can use:  An automated blood pressure machine at a pharmacy.  A home blood pressure monitor.  If you are between 64 years and 77 years old, ask your health care provider if you should take aspirin to prevent strokes.  Have regular diabetes screenings. This involves taking a blood sample to check your  fasting blood sugar level.  If you are at a normal weight and have a low risk for diabetes, have this test once every three years after 66 years of age.  If you are overweight and have a high risk for diabetes, consider being tested at a younger age or more often. PREVENTING INFECTION  Hepatitis B  If you have a higher risk for hepatitis B, you should be screened for this virus. You are considered at high risk for hepatitis B if:  You were born in a country where hepatitis B is common. Ask your health care provider which countries are considered high risk.  Your parents were born in a high-risk country, and you have not been immunized against hepatitis B (hepatitis B vaccine).  You have HIV or AIDS.  You use needles to inject street drugs.  You live with someone who has hepatitis B.  You have had sex with someone who has hepatitis B.  You get hemodialysis treatment.  You take certain medicines for conditions, including cancer, organ transplantation, and autoimmune conditions. Hepatitis C  Blood testing is recommended for:  Everyone born from [birth year] through 1966.  Anyone with known risk factors for hepatitis C. Sexually transmitted infections (STIs)  You should be screened for sexually transmitted infections (STIs) including gonorrhea and chlamydia if:  You are sexually active and are younger than 66 years of age.  You are older than 66 years of age and your health care provider tells you that you are at risk for this type of infection.  Your sexual activity has changed since you were last screened and you are at an increased risk for chlamydia or gonorrhea. Ask your health care provider if you are at risk.  If you do not have HIV, but are at risk, it may be recommended that you take a prescription medicine daily to prevent HIV infection. This is called pre-exposure prophylaxis (PrEP). You are considered at risk if:  You are sexually active and do not regularly use condoms or  know the HIV status of your partner(s).  You take drugs by injection.  You are sexually active with a partner who has HIV. Talk with your health care provider about whether you are at high risk of being infected with HIV. If you choose to begin PrEP, you should first be tested for HIV. You should then be tested every 3 months for as long as you are taking PrEP.  PREGNANCY   If you are premenopausal and you may become pregnant, ask your health care provider about preconception counseling.  If you may become pregnant, take 400 to 800 micrograms (mcg) of folic acid every day.  If you want to prevent pregnancy, talk to your health care provider about birth control (contraception). OSTEOPOROSIS AND MENOPAUSE   Osteoporosis is a disease in which the bones lose minerals and strength with aging. This can result in serious bone fractures. Your risk for osteoporosis can be identified using a bone density scan.  If you are 40 years of age or older, or if you are at risk for osteoporosis and fractures, ask your health care provider if you should be screened.  Ask your  health care provider whether you should take a calcium or vitamin D supplement to lower your risk for osteoporosis.  Menopause may have certain physical symptoms and risks.  Hormone replacement therapy may reduce some of these symptoms and risks. Talk to your health care provider about whether hormone replacement therapy is right for you.  HOME CARE INSTRUCTIONS   Schedule regular health, dental, and eye exams.  Stay current with your immunizations.   Do not use any tobacco products including cigarettes, chewing tobacco, or electronic cigarettes.  If you are pregnant, do not drink alcohol.  If you are breastfeeding, limit how much and how often you drink alcohol.  Limit alcohol intake to no more than 1 drink per day for nonpregnant women. One drink equals 12 ounces of beer, 5 ounces of wine, or 1 ounces of hard liquor.  Do  not use street drugs.  Do not share needles.  Ask your health care provider for help if you need support or information about quitting drugs.  Tell your health care provider if you often feel depressed.  Tell your health care provider if you have ever been abused or do not feel safe at home.   This information is not intended to replace advice given to you by your health care provider. Make sure you discuss any questions you have with your health care provider.   Document Released: 09/27/2010 Document Revised: 04/04/2014 Document Reviewed: 02/13/2013 Elsevier Interactive Patient Education 2016 [place] A mammogram is an X-ray of the breasts that is done to check for changes that are not normal. This test can screen for and find any changes that may suggest breast cancer. This test can also help to find other changes and variations in the breast. BEFORE THE PROCEDURE  Have this test done about 1-2 weeks after your period. This is usually when your breasts are the least tender.  If you are visiting a new doctor or clinic, send any past mammogram images to your new doctor's office.  Wash your breasts and under your arms the day of the test.  Do not use deodorants, perfumes, lotions, or powders on the day of the test.  Take off any jewelry from your neck.  Wear clothes that you can change into and out of easily. PROCEDURE  You will undress from the waist up. You will put on a gown.  You will stand in front of the X-ray machine.  Each breast will be placed between two plastic or glass plates. The plates will press down on your breast for a few seconds. Try to stay as relaxed as possible. This does not cause any harm to your breasts. Any discomfort you feel will be very brief.  X-rays will be taken from different angles of each breast. The procedure may vary among doctors and hospitals. AFTER THE PROCEDURE  The mammogram will be looked at by a specialist  (radiologist).  You may need to do certain parts of the test again. This depends on the quality of the images.  Ask when your test results will be ready. Make sure you get your test results.  You may go back to your normal activities.   This information is not intended to replace advice given to you by your health care provider. Make sure you discuss any questions you have with your health care provider.   Document Released: 06/06/2011 Document Revised: 12/03/2014 Document Reviewed: 05/23/2014 Elsevier Interactive Patient Education [employer].

## 2015-09-08 NOTE — Telephone Encounter (Signed)
Patient needs to be seen if she s having pain. Unsure why she needs medication. Has not been prescribed for her since 2015 and I have never given it to her. Refusing it at this time.

## 2015-09-08 NOTE — Progress Notes (Signed)
Addendum: I have reviewed this visit and discussed with Howell Rucks, RN, BSN, and agree with her documentation.  Luiz Blare, DO 09/08/2015, 5:04 PM

## 2015-09-08 NOTE — Progress Notes (Signed)
Subjective:   Savannah Burns is a 65 y.o. female who presents for an Initial Medicare Annual Wellness Visit.  Cardiac Risk Factors include: advanced age (>40mn, >>44women);diabetes mellitus;dyslipidemia;hypertension;obesity (BMI >30kg/m2);sedentary lifestyle;smoking/ tobacco exposure (Quit smoking 04/29/2015)     Objective:    Today's Vitals   09/08/15 1340 09/08/15 1342  BP:  130/70  Pulse:  71  Temp:  98.5 F (36.9 C)  TempSrc:  Oral  Height: 5' 2"  (1.575 m)   Weight: 279 lb 9.6 oz (126.826 kg)   SpO2:  96%  PainSc:  9    Body mass index is 51.13 kg/(m^2).   Current Medications (verified) Outpatient Encounter Prescriptions as of 09/08/2015  Medication Sig  . albuterol (PROVENTIL HFA;VENTOLIN HFA) 108 (90 Base) MCG/ACT inhaler Inhale 1-2 puffs into the lungs every 6 (six) hours as needed for wheezing or shortness of breath.  .Marland Kitchenaspirin 81 MG tablet Take 81 mg by mouth daily. Reported on 06/01/2015  . Blood Glucose Calibration (GLUCOMETER DEX HIGH CONTROL) LIQD Test CBG's once daily.   . Budesonide 90 MCG/ACT inhaler Inhale 1 puff into the lungs 2 (two) times daily.  . citalopram (CELEXA) 40 MG tablet TAKE ONE TABLET BY MOUTH EVERY DAY  . furosemide (LASIX) 20 MG tablet Take 3 tablets (60 mg total) by mouth daily. 2 tablets in the morning and one at night. (Patient taking differently: Take 20 mg by mouth 2 (two) times daily. 2 tablets in the morning and one at night.)  . glucose blood (GLUCOMETER DEX TEST SENSORS) test strip as directed. Use as instructed   . HYDROcodone-acetaminophen (NORCO) 10-325 MG per tablet Take 1 tablet by mouth every 8 (eight) hours as needed.  .Marland Kitchenlisinopril (PRINIVIL,ZESTRIL) 10 MG tablet TAKE ONE TABLET BY MOUTH ONCE DAILY  . lovastatin (MEVACOR) 20 MG tablet TAKE ONE TABLET BY MOUTH AT BEDTIME  . metFORMIN (GLUCOPHAGE) 500 MG tablet TAKE ONE TABLET BY MOUTH TWICE DAILY WITH A MEAL  . Cholecalciferol (VITAMIN D3) 50000 units TABS Take 1 tablet by  mouth once a week. For 8 weeks (Patient not taking: Reported on 09/08/2015)  . Tdap (BOOSTRIX) 5-2.5-18.5 LF-MCG/0.5 injection Inject 0.5 mLs into the muscle once. (Patient not taking: Reported on 06/27/2014)  . [DISCONTINUED] acetic acid-hydrocortisone (VOSOL-HC) otic solution Place 3 drops into both ears 2 (two) times daily. (Patient not taking: Reported on 09/08/2015)   No facility-administered encounter medications on file as of 09/08/2015.    Allergies (verified) Review of patient's allergies indicates no known allergies.  Patient states she had severe nausea and vomiting from anesthesia following c-sect. Has had multiple back surgeries since without incident  History: Past Medical History  Diagnosis Date  . Hyperlipidemia   . Hypertension   . [facility] disease[facility]   . Depression   . Diabetes mellitus without complication ([facility]    Past Surgical History  Procedure Laterality Date  . Spine surgery    . Cesarean section     Family History  Problem Relation Age of Onset  . Diabetes Father   . Heart disease Father 715   MI  . Hypertension Father   . COPD Sister   . Cancer Paternal Grandmother [id number]   Pancreatic   Social History   Occupational History  . Not on file.   Social History Main Topics  . Smoking status: Former Smoker -- 1.50 packs/day for 40 years    Types: Cigarettes    Quit date: 04/29/2015  .  Smokeless tobacco: Former Systems developer    Quit date: 04/29/2015     Comment: Quit smoking 04/2015- Previous 1.5 ppd smoker  . Alcohol Use: No  . Drug Use: No  . Sexual Activity: Not Currently    Tobacco Counseling Counseling given: Not Answered Patient quit smoking and vaping on 04/29/2015 and does not plan on restarting  Activities of Daily Living In your present state of health, do you have any difficulty performing the following activities: 09/08/2015  Hearing? Y  Vision? Y  Difficulty concentrating or making decisions? Y  Walking or climbing  stairs? Y  Dressing or bathing? N  Doing errands, shopping? N  Preparing Food and eating ? N  Using the Toilet? N  In the past six months, have you accidently leaked urine? N  Do you have problems with loss of bowel control? N  Managing your Medications? N  Managing your Finances? N  Housekeeping or managing your Housekeeping? Y   Home Safety:  My home has a working smoke alarm:  Yes           My home throw rugs have been fastened down to the floor or removed:  Yes I have non-slip mats in the bathtub and shower:  Yes         All my home's stairs have railings or bannisters: Yes         My home's floors, stairs and hallways are free from clutter, wires and cords:  Yes        Immunizations and Health Maintenance Immunization History  Administered Date(s) Administered  . Influenza Whole 01/04/2008, 03/24/2009  . Influenza,inj,Quad PF,36+ Mos 11/27/2014  . Influenza-Unspecified 01/26/2013, 03/01/2014  . Pneumococcal Conjugate-13 03/01/2014  . Td 05/27/2002  . Zoster 02/14/2013   Health Maintenance Due  Topic Date Due  . Hepatitis C Screening  1951-03-14  . OPHTHALMOLOGY EXAM  09/13/1959  . HIV Screening  09/12/1964  . MAMMOGRAM  09/13/1999  . COLONOSCOPY  09/13/1999  . TETANUS/TDAP  05/26/2012  . DEXA SCAN  09/13/2014  . PNA vac Low Risk Adult (2 of 2 - PPSV23) 03/02/2015  . HEMOGLOBIN A1C  06/10/2015  . FOOT EXAM  06/27/2015    Patient Care Team: Katheren Shams, DO as PCP - General (Family Medicine) Zonia Kief, MD (Rehabilitation) Will see Dr. Halford Chessman at [facility] in August for sleep study  Indicate any recent Medical Services you may have received from other than [facility] providers in the past year (date may be approximate).     Assessment:   This is a routine wellness examination for Savannah Burns.   Hearing/Vision screen  Hearing Screening   125Hz  250Hz  500Hz  1000Hz  2000Hz  4000Hz  8000Hz   Right ear:   40 40 40 40   Left ear:   40 40 40 40     Dietary issues and  exercise activities discussed: Current Exercise Habits: The patient does not participate in regular exercise at present (Trying to walk in driveway), Exercise limited by: respiratory conditions(s);orthopedic condition(s)  Goals    . Blood Pressure < 140/90    . Exercise 150 minutes per week (moderate activity)    . Weight < 259 lb (117.482 kg)     7% weight loss     Discussed recording consumption intake to assist with desired 7% weight loss. Recommended MyPLate or My Fitness Pal apps to assist with recording.  Depression Screen PHQ 2/9 Scores 09/08/2015 06/29/2015 04/30/2015 04/17/2015 03/13/2015 12/19/2014 12/11/2014  PHQ - 2 Score 3 3 0 0  0 0 0  PHQ- 9 Score 10 8 - - - - -  Patient would like to set up appt with Behavioral Health if insurance will cover it.  Fall Risk Fall Risk  09/08/2015 04/30/2015 04/17/2015 08/20/2014  Falls in the past year? No No No No    Cognitive Function: Mini-Cog passed with score 5/5  TUG Test:  Passed in 10 seconds. Patient used both hands to push out of chair and to sit back down.  Screening Tests Health Maintenance  Topic Date Due  . Hepatitis C Screening  08-May-1950  . OPHTHALMOLOGY EXAM  09/13/1959  . HIV Screening  09/12/1964  . MAMMOGRAM  09/13/1999  . COLONOSCOPY  09/13/1999  . TETANUS/TDAP  05/26/2012  . DEXA SCAN  09/13/2014  . PNA vac Low Risk Adult (2 of 2 - PPSV23) 03/02/2015  . HEMOGLOBIN A1C  06/10/2015  . FOOT EXAM  06/27/2015  . INFLUENZA VACCINE  10/27/2015  . PAP SMEAR  04/16/2018  . ZOSTAVAX  Completed  Hep C and HIV drawn today Patient given list of eye doctors who take [facility] She will schedule eye exam Patient refusing mammo and colonoscopy at this time. Patient given literature on these screenings to review. States she will "think about it." Patient will receive TDaP at local pharmacy Patient will contact [facility] to schedule bone density exam Pnemovax-23 given today  Diabetic Foot Exam - Simple   Simple Foot Form    Diabetic Foot exam was performed with the following findings:  Yes 09/08/2015  2:52 PM  Visual Inspection  No deformities, no ulcerations, no other skin breakdown bilaterally:  Yes  Sensation Testing  See comments:  Yes  Pulse Check  Posterior Tibialis and Dorsalis pulse intact bilaterally:  Yes  Comments  Decreased sensation right 2nd and 3rd phalanges and left 2nd phalange         Plan:    During the course of the visit, Savannah Burns was educated and counseled about the following appropriate screening and preventive services:   Vaccines to include Pneumoccal, Influenza, Td, Zostavax  Cardiovascular disease screening  Colorectal cancer screening  Bone density screening  Diabetes screening  Glaucoma screening  Mammography/PAP  Nutrition counseling  Patient Instructions (the written plan) were given to the patient.    Velora Heckler, RN   09/08/2015

## 2015-09-09 LAB — HIV ANTIBODY (ROUTINE TESTING W REFLEX): HIV: NONREACTIVE

## 2015-09-09 LAB — HEPATITIS C ANTIBODY: HCV AB: NEGATIVE

## 2015-09-10 ENCOUNTER — Telehealth: Payer: Self-pay | Admitting: *Deleted

## 2015-09-10 NOTE — Telephone Encounter (Signed)
Called patient to schedule appt with PCP to discuss issues brought up during AWV Patient given appt for 10/02/2015 at 10:30 Satonya Lux, Orvis Brill, RN

## 2015-09-14 ENCOUNTER — Other Ambulatory Visit: Payer: Self-pay | Admitting: Family Medicine

## 2015-10-02 ENCOUNTER — Ambulatory Visit: Payer: Medicare Other | Admitting: Obstetrics and Gynecology

## 2015-10-29 ENCOUNTER — Telehealth: Payer: Self-pay | Admitting: Obstetrics and Gynecology

## 2015-10-29 DIAGNOSIS — G4719 Other hypersomnia: Secondary | ICD-10-CM

## 2015-10-29 NOTE — Telephone Encounter (Signed)
Pt has a referral to [place] for a sleep study, she has been rescheduled twice the day before each time. Pt would like a referral elsewhere. Pt's sleep study has been pushed to October as of now. Please advise. Thanks! ep

## 2015-10-30 ENCOUNTER — Institutional Professional Consult (permissible substitution): Payer: Medicare Other | Admitting: Pulmonary Disease

## 2015-10-30 NOTE — Telephone Encounter (Signed)
Please look into this for me. I do not think I need to place a new referral as her current one is still active.

## 2015-11-03 NOTE — Telephone Encounter (Signed)
New order placed for split night sleep study to be performed at [facility].  They will contact patient for an appt. Melissa Pulido,CMA

## 2015-11-16 ENCOUNTER — Other Ambulatory Visit: Payer: Self-pay | Admitting: Family Medicine

## 2015-12-17 ENCOUNTER — Ambulatory Visit (HOSPITAL_BASED_OUTPATIENT_CLINIC_OR_DEPARTMENT_OTHER): Payer: Medicare Other | Attending: Family Medicine | Admitting: Internal Medicine

## 2015-12-17 DIAGNOSIS — G4719 Other hypersomnia: Secondary | ICD-10-CM | POA: Diagnosis not present

## 2015-12-17 DIAGNOSIS — I509 Heart failure, unspecified: Secondary | ICD-10-CM | POA: Diagnosis not present

## 2015-12-17 DIAGNOSIS — Z6841 Body Mass Index (BMI) 40.0 and over, adult: Secondary | ICD-10-CM | POA: Diagnosis not present

## 2015-12-17 DIAGNOSIS — R0683 Snoring: Secondary | ICD-10-CM | POA: Insufficient documentation

## 2015-12-17 DIAGNOSIS — E669 Obesity, unspecified: Secondary | ICD-10-CM | POA: Diagnosis not present

## 2015-12-19 NOTE — Procedures (Signed)
   Patient Name: Savannah Burns, Savannah Burns Date: 12/17/2015 Gender: Female D.O.B: [birth year] Age (years): 65 Referring Provider: Talbert Cage Height (inches): 62 Interpreting Physician: Baird Lyons MD, ABSM Weight (lbs): 290 RPSGT: Baxter Flattery BMI: 26 MRN: [id number] Neck Size: 17.50 CLINICAL INFORMATION Sleep Study Type: NPSG Indication for sleep study: Congestive Heart Failure, Excessive Daytime Sleepiness, Obesity, Snoring, Witnessed Apneas Epworth Sleepiness Score: 15  SLEEP STUDY TECHNIQUE As per the AASM Manual for the Scoring of Sleep and Associated Events v2.3 (April 2016) with a hypopnea requiring 4% desaturations. The channels recorded and monitored were frontal, central and occipital EEG, electrooculogram (EOG), submentalis EMG (chin), nasal and oral airflow, thoracic and abdominal wall motion, anterior tibialis EMG, snore microphone, electrocardiogram, and pulse oximetry.  MEDICATIONS Patient's medications include: charted for review. Medications self-administered by patient during sleep study : No sleep medicine administered.  SLEEP ARCHITECTURE The study was initiated at 10:27:54 PM and ended at 4:59:11 AM. Sleep onset time was 45.2 minutes and the sleep efficiency was 49.8%. The total sleep time was 195.0 minutes. Stage REM latency was N/A minutes. The patient spent 16.15% of the night in stage N1 sleep, 83.85% in stage N2 sleep, 0.00% in stage N3 and 0.00% in REM. Alpha intrusion was absent. Supine sleep was 1.03%. Wake after sleep onset 151 minutes  RESPIRATORY PARAMETERS The overall apnea/hypopnea index (AHI) was 1.5 per hour. There were 4 total apneas, including 4 obstructive, 0 central and 0 mixed apneas. There were 1 hypopneas and 101 RERAs. The AHI during Stage REM sleep was N/A per hour. AHI while supine was 0.0 per hour. The mean oxygen saturation was 91.57%. The minimum SpO2 during sleep was 88.00%. Loud snoring was noted during this  study.  CARDIAC DATA The 2 lead EKG demonstrated sinus rhythm. The mean heart rate was 66.70 beats per minute. Other EKG findings include: None.  LEG MOVEMENT DATA The total PLMS were 0 with a resulting PLMS index of 0.00. Associated arousal with leg movement index was 0.0 .  IMPRESSIONS - No significant obstructive sleep apnea occurred during this study (AHI = 1.5/h). - No significant central sleep apnea occurred during this study (CAI = 0.0/h). - Mild oxygen desaturation was noted during this study (Min O2 = 88.00%). - The patient snored with Loud snoring volume. - No cardiac abnormalities were noted during this study. - Clinically significant periodic limb movements did not occur during sleep. No significant associated arousals.  DIAGNOSIS - Primary Snoring (786.09 [R06.83 ICD-10])  RECOMMENDATIONS - Avoid alcohol, sedatives and other CNS depressants that may worsen sleep apnea and disrupt normal sleep architecture. - Sleep hygiene should be reviewed to assess factors that may improve sleep quality. - Weight management and regular exercise should be initiated or continued if appropriate.  [Electronically signed] 12/19/2015 04:12 PM  Baird Lyons MD, [place], American Board of Sleep Medicine   NPI: [id number]  [place], American Board of Sleep Medicine  ELECTRONICALLY SIGNED ON:  12/19/2015, 4:10 PM [place] PH: [phone]   FX: [phone] [place]

## 2015-12-24 ENCOUNTER — Other Ambulatory Visit: Payer: Self-pay | Admitting: *Deleted

## 2015-12-25 MED ORDER — FUROSEMIDE 20 MG PO TABS: 20.0000 mg | ORAL_TABLET | Freq: Two times a day (BID) | ORAL | 3 refills | Status: DC

## 2015-12-28 NOTE — Telephone Encounter (Signed)
Pharmacy needs clarification on directions for lasix. Is it 1 tab BID or 2 tabs in the am and 1 at night?

## 2015-12-29 NOTE — Telephone Encounter (Signed)
Two tabs in AM and one at night. Thanks for clarifying.

## 2015-12-30 ENCOUNTER — Institutional Professional Consult (permissible substitution): Payer: Medicare Other | Admitting: Pulmonary Disease

## 2016-01-19 ENCOUNTER — Telehealth: Payer: Self-pay | Admitting: Obstetrics and Gynecology

## 2016-01-19 NOTE — Telephone Encounter (Signed)
Will forward to MD to advise.  If eye exam were sent to [place] they would likely be in the providers box for review before being scanned in. [facility]

## 2016-01-19 NOTE — Telephone Encounter (Signed)
Need sleep study results, she also had a eye exam in her home.  It was done thru her insurance company. Would like to have results from each of them

## 2016-01-20 ENCOUNTER — Encounter: Payer: Self-pay | Admitting: Obstetrics and Gynecology

## 2016-01-20 NOTE — Telephone Encounter (Signed)
Do not have anything in my box currently. Otherwise may have seen it and placed in scan pile. She should contact insurance company for results. Sleep study results sent to patient in letter.

## 2016-01-21 NOTE — Telephone Encounter (Signed)
Spoke with patient and she states that she did try calling insurance and person who performed eye exam and they didn't have results for her either.  Patient is aware that letter is coming in the mail but it was also read to her during phone call. Jazmin Hartsell,CMA

## 2016-02-10 ENCOUNTER — Ambulatory Visit (INDEPENDENT_AMBULATORY_CARE_PROVIDER_SITE_OTHER): Payer: Medicare Other | Admitting: Family Medicine

## 2016-02-10 ENCOUNTER — Other Ambulatory Visit: Payer: Self-pay | Admitting: *Deleted

## 2016-02-10 VITALS — BP 152/92 | HR 91 | Temp 98.2°F | Wt 294.0 lb

## 2016-02-10 DIAGNOSIS — L918 Other hypertrophic disorders of the skin: Secondary | ICD-10-CM | POA: Diagnosis not present

## 2016-02-10 DIAGNOSIS — L821 Other seborrheic keratosis: Secondary | ICD-10-CM | POA: Diagnosis not present

## 2016-02-10 DIAGNOSIS — E119 Type 2 diabetes mellitus without complications: Secondary | ICD-10-CM

## 2016-02-10 LAB — POCT GLYCOSYLATED HEMOGLOBIN (HGB A1C): Hemoglobin A1C: 7.8

## 2016-02-10 NOTE — Progress Notes (Signed)
  Shave Biopsy Procedure Note  Pre-operative Diagnosis: Likely seborrheic keratosis (L shoulder), skin tags  Post-operative Diagnosis: same  Locations:L shoulder, L mid back, L upper thigh  Indications: patient discomfort  Anesthesia: Lidocaine 1% with epinephrine without added sodium bicarbonate  Procedure Details  History of allergy to iodine: no  Patient informed of the risks (including bleeding and infection) and benefits of the  procedure and Written informed consent obtained.  The lesion and surrounding area were given a sterile prep using alcohol and draped in the usual sterile fashion. A scalpel was used to shave an area of skin approximately 1cm by 1cm on left shoulder. A scalpel was used to shave an area of skin approximately 0.5cm by 0.5cm on left mid back. A scalpel was used to shave an area of skin approximately 0.5cm by 0.5cm.   Hemostasis achieved with silver nitrate. Antibiotic ointment and a sterile dressing applied.  The specimen were sent for pathologic examination. The patient tolerated the procedure well.  EBL: 5 ml  Findings: Likely Seborrheic keratosis, L shoulder, excised, hemostatic after silver nitrate application.  Skin tag of left mid back, excised, hemostatic after silver nitrate application.  Skin tag of L upper thigh (under pannus), excised, hemostatic after silver nitrate application.   Condition: Stable  Complications: none.  Plan: 1. Instructed to keep the wound dry and covered for 24-48h and clean thereafter. 2. Warning signs of infection were reviewed.   3. Recommended that the patient use tylenol as needed for pain.  4. Return as needed for recurrence, pain, or bleeding.   Additional Procedure: Cryotherapy of R temporal lesion 0.5cm x 0.5cm and L upper lip lesion 0.25cm x 0.25cm. Patient tolerated well.

## 2016-02-10 NOTE — Patient Instructions (Signed)
It was a pleasure to meet you today! We removed 3 lesions and froze 2 lesions. We sent the back and shoulder lesions to the pathologists. We will call you and let you know what these lesions end up being based on the pathology report. As we discussed, return to clinic if you have bleeding, fever, warmth, or signs of infection.   We sent a message to Dr. Gerarda Fraction about your refills.

## 2016-02-11 ENCOUNTER — Telehealth: Payer: Self-pay | Admitting: *Deleted

## 2016-02-11 MED ORDER — GLUCOSE BLOOD VI STRP: ORAL_STRIP | 1 refills | Status: DC

## 2016-02-11 MED ORDER — LISINOPRIL 10 MG PO TABS: 10.0000 mg | ORAL_TABLET | Freq: Every day | ORAL | 0 refills | Status: DC

## 2016-02-11 MED ORDER — ALBUTEROL SULFATE HFA 108 (90 BASE) MCG/ACT IN AERS: 1.0000 | INHALATION_SPRAY | Freq: Four times a day (QID) | RESPIRATORY_TRACT | 1 refills | Status: DC | PRN

## 2016-02-11 MED ORDER — BUDESONIDE 90 MCG/ACT IN AEPB: 1.0000 | INHALATION_SPRAY | Freq: Two times a day (BID) | RESPIRATORY_TRACT | 1 refills | Status: DC

## 2016-02-11 MED ORDER — FUROSEMIDE 20 MG PO TABS: 20.0000 mg | ORAL_TABLET | Freq: Two times a day (BID) | ORAL | 0 refills | Status: DC

## 2016-02-11 MED ORDER — METFORMIN HCL 500 MG PO TABS: 500.0000 mg | ORAL_TABLET | Freq: Two times a day (BID) | ORAL | 0 refills | Status: DC

## 2016-02-11 MED ORDER — LOVASTATIN 20 MG PO TABS: 20.0000 mg | ORAL_TABLET | Freq: Every day | ORAL | 0 refills | Status: DC

## 2016-02-11 NOTE — Telephone Encounter (Signed)
Needs office visit to address chronic medical problems. Limited refills given until she can follow-up.

## 2016-02-11 NOTE — Telephone Encounter (Signed)
Prior Authorization received from [employer] for [employer]. Formulary and PA form placed in provider box for completion. Derl Barrow, RN

## 2016-02-12 ENCOUNTER — Other Ambulatory Visit: Payer: Self-pay | Admitting: Obstetrics and Gynecology

## 2016-02-12 ENCOUNTER — Telehealth: Payer: Self-pay | Admitting: *Deleted

## 2016-02-12 NOTE — Telephone Encounter (Signed)
Prior Authorization received from [employer] for Proventil [facility] inhaler.  PA form placed in provider box for completion. Derl Barrow, RN

## 2016-02-15 ENCOUNTER — Telehealth: Payer: Self-pay | Admitting: Family Medicine

## 2016-02-15 MED ORDER — BECLOMETHASONE DIPROPIONATE 40 MCG/ACT IN AERS: 1.0000 | INHALATION_SPRAY | Freq: Two times a day (BID) | RESPIRATORY_TRACT | 12 refills | Status: DC

## 2016-02-15 MED ORDER — ALBUTEROL SULFATE HFA 108 (90 BASE) MCG/ACT IN AERS: 2.0000 | INHALATION_SPRAY | Freq: Four times a day (QID) | RESPIRATORY_TRACT | 2 refills | Status: DC | PRN

## 2016-02-15 NOTE — Telephone Encounter (Signed)
Sent in generic

## 2016-02-15 NOTE — Telephone Encounter (Signed)
2nd request for prior authorization for Pulmicort Flexhaler.  Derl Barrow, RN

## 2016-02-15 NOTE — Telephone Encounter (Signed)
HIPPA compliant message left with her daughter to call me back.    Reason for call: I contacted her about derm path report.  Path negative for cancer. Please advise her when she calls back that her pathology report is neg for cancer and to get yearly skin exam.

## 2016-02-15 NOTE — Telephone Encounter (Signed)
Switched medications to formulary acceptable medications.

## 2016-05-20 ENCOUNTER — Telehealth: Payer: Self-pay | Admitting: Obstetrics and Gynecology

## 2016-05-20 NOTE — Telephone Encounter (Signed)
Scheduled AWV 05/31/16 @ 1:30pm - Mesha Guinyard

## 2016-05-31 ENCOUNTER — Ambulatory Visit: Payer: Medicare Other | Admitting: *Deleted

## 2016-06-14 ENCOUNTER — Other Ambulatory Visit: Payer: Self-pay | Admitting: Obstetrics and Gynecology

## 2016-06-20 ENCOUNTER — Other Ambulatory Visit: Payer: Self-pay | Admitting: *Deleted

## 2016-06-20 MED ORDER — CITALOPRAM HYDROBROMIDE 40 MG PO TABS: ORAL_TABLET | ORAL | 2 refills | Status: DC

## 2016-08-31 ENCOUNTER — Other Ambulatory Visit: Payer: Self-pay | Admitting: Obstetrics and Gynecology

## 2016-09-15 ENCOUNTER — Telehealth: Payer: Self-pay | Admitting: Obstetrics and Gynecology

## 2016-09-15 NOTE — Telephone Encounter (Signed)
Don't have her letter.

## 2016-09-15 NOTE — Telephone Encounter (Signed)
Savannah Burns from [facility] called to check the status of some papers they faxed on the 18 th. They were addressed to Dr. Gwendlyn Deutscher but Dr. Gerarda Fraction is her PCP. I checked both boxes and didn't see any forms for the patient. Can we check the status and let them know. jw

## 2016-09-15 NOTE — Telephone Encounter (Signed)
Would have them refax. I do not recall seeing this. I will be in clinic tomorrow and can complete then.

## 2016-09-15 NOTE — Telephone Encounter (Signed)
I contacted Renee and informed her the fax was not received by PCP or [place]. Joseph Art is to fax again to our office. I provided her with our fax number.

## 2016-09-15 NOTE — Telephone Encounter (Signed)
Sound familiar to either of you? If not will call and have them refax.

## 2016-09-21 ENCOUNTER — Encounter: Payer: Self-pay | Admitting: Obstetrics and Gynecology

## 2016-09-21 ENCOUNTER — Ambulatory Visit (INDEPENDENT_AMBULATORY_CARE_PROVIDER_SITE_OTHER): Payer: Medicare Other | Admitting: Obstetrics and Gynecology

## 2016-09-21 VITALS — BP 134/70 | HR 92 | Temp 98.7°F | Ht 62.0 in | Wt 295.0 lb

## 2016-09-21 DIAGNOSIS — M79641 Pain in right hand: Secondary | ICD-10-CM | POA: Diagnosis not present

## 2016-09-21 DIAGNOSIS — I10 Essential (primary) hypertension: Secondary | ICD-10-CM | POA: Diagnosis not present

## 2016-09-21 DIAGNOSIS — M79642 Pain in left hand: Secondary | ICD-10-CM

## 2016-09-21 DIAGNOSIS — E119 Type 2 diabetes mellitus without complications: Secondary | ICD-10-CM | POA: Diagnosis not present

## 2016-09-21 DIAGNOSIS — M545 Low back pain: Secondary | ICD-10-CM

## 2016-09-21 DIAGNOSIS — G8929 Other chronic pain: Secondary | ICD-10-CM

## 2016-09-21 DIAGNOSIS — I5032 Chronic diastolic (congestive) heart failure: Secondary | ICD-10-CM | POA: Diagnosis not present

## 2016-09-21 DIAGNOSIS — R5382 Chronic fatigue, unspecified: Secondary | ICD-10-CM

## 2016-09-21 LAB — POCT GLYCOSYLATED HEMOGLOBIN (HGB A1C): HEMOGLOBIN A1C: 8.5

## 2016-09-21 MED ORDER — CITALOPRAM HYDROBROMIDE 40 MG PO TABS: ORAL_TABLET | ORAL | 2 refills | Status: DC

## 2016-09-21 MED ORDER — ALBUTEROL SULFATE HFA 108 (90 BASE) MCG/ACT IN AERS: 2.0000 | INHALATION_SPRAY | Freq: Four times a day (QID) | RESPIRATORY_TRACT | 2 refills | Status: DC | PRN

## 2016-09-21 MED ORDER — METFORMIN HCL 1000 MG PO TABS: 1000.0000 mg | ORAL_TABLET | Freq: Two times a day (BID) | ORAL | 2 refills | Status: DC

## 2016-09-21 MED ORDER — LISINOPRIL 10 MG PO TABS: 10.0000 mg | ORAL_TABLET | Freq: Every day | ORAL | 2 refills | Status: DC

## 2016-09-21 MED ORDER — FUROSEMIDE 20 MG PO TABS: 20.0000 mg | ORAL_TABLET | Freq: Two times a day (BID) | ORAL | 3 refills | Status: DC

## 2016-09-21 MED ORDER — LOVASTATIN 20 MG PO TABS: 20.0000 mg | ORAL_TABLET | Freq: Every day | ORAL | 2 refills | Status: DC

## 2016-09-21 MED ORDER — GABAPENTIN 300 MG PO CAPS: 300.0000 mg | ORAL_CAPSULE | Freq: Every day | ORAL | 2 refills | Status: DC

## 2016-09-21 NOTE — Progress Notes (Signed)
Subjective: Chief Complaint  Patient presents with  . Fatigue  . Hand Pain     HPI: Savannah Burns is a 66 y.o. presenting to clinic today to discuss the following:  #Diabetes:  Last A1c 7.5 Sugars - not checking consistently, last time checked was 154 Taking medications: yes Side effects: no On Aspirin On statin ROS: denies fever, chills, dizziness, LOC, polyuria, polydipsia, numbness or tingling in extremities or chest pain, visual disturbances.  #Hand Pain Having bilateral hand pain that is chronic Acutely worsened States that she has a hard time closing her hands Believes it is arthritis since it runs in her family Has never been worked up before Tender to touch and swollen  #Back pain Chronic concern Has been seen by neurosurgeon prior and had back surgeries nothing else can be done for her Has tried PT, injections, surgeries Would like pain management   #Fatigue Feels like she is sleeping a lot Not normally like this Has been present for a couple months Can sleep for the majority of the day Will wake up rested Has had sleep study and did not have OSA Feels like stress has been increased  Health Maintenance Due  Topic Date Due  . OPHTHALMOLOGY EXAM  09/13/1959  . MAMMOGRAM  09/13/1999  . COLONOSCOPY  09/13/1999  . TETANUS/TDAP  05/26/2012  . DEXA SCAN  09/13/2014  . FOOT EXAM  09/07/2016   ROS noted in HPI.   Past Medical, Surgical, Social, and Family History Reviewed & Updated per EMR.    History  Smoking Status  . Former Smoker  . Packs/day: 1.50  . Years: 40.00  . Types: Cigarettes  . Quit date: 04/29/2015  Smokeless Tobacco  . Former Systems developer  . Quit date: 04/29/2015    Comment: Quit smoking 04/2015- Previous 1.5 ppd smoker    Objective: BP 134/70   Pulse 92   Temp 98.7 F (37.1 C) (Oral)   Ht 5' 2"  (1.575 m)   Wt 295 lb (133.8 kg)   SpO2 95%   BMI 53.96 kg/m  Vitals and nursing notes reviewed  Physical Exam    Constitutional: She is well-developed, well-nourished, and in no distress.  HENT:  Mouth/Throat: Oropharynx is clear and moist.  Eyes: Conjunctivae and EOM are normal.  Neck: Normal range of motion. Neck supple.  Cardiovascular: Normal rate, regular rhythm and normal heart sounds.   Pulmonary/Chest: Effort normal and breath sounds normal. She has no wheezes. She has no rales.  Abdominal: Soft. There is no tenderness. There is no guarding.  Musculoskeletal: She exhibits edema.       Right hand: She exhibits decreased range of motion and swelling.       Left hand: Normal.  Index finger and middle finger on right hand with tenderness and swelling at the DIP joints.     Assessment/Plan: Please see problem based Assessment and Plan PATIENT EDUCATION PROVIDED: See AVS    Diagnosis and plan along with any newly prescribed medication(s) were discussed in detail with this patient today. The patient verbalized understanding and agreed with the plan. Patient advised if symptoms worsen return to clinic or ER.   Health Maintainance:   Orders Placed This Encounter  Procedures  . Sedimentation Rate  . C-reactive protein  . Rheumatoid factor  . Cyclic citrul peptide antibody, IgG  . ANA  . CBC  . Comprehensive metabolic panel    Order Specific Question:   Has the patient fasted?    Answer:  No  . Vitamin B12  . VITAMIN D 25 Hydroxy (Vit-D Deficiency, Fractures)  . Uric Acid  . TSH  . Ambulatory referral to Pain Clinic    Referral Priority:   Routine    Referral Type:   Consultation    Referral Reason:   Specialty Services Required    Requested Specialty:   Pain Medicine    Number of Visits Requested:   1  . HgB A1c    Meds ordered this encounter  Medications  . metFORMIN (GLUCOPHAGE) 1000 MG tablet    Sig: Take 1 tablet (1,000 mg total) by mouth 2 (two) times daily with a meal.    Dispense:  90 tablet    Refill:  2  . gabapentin (NEURONTIN) 300 MG capsule    Sig: Take 1  capsule (300 mg total) by mouth at bedtime.    Dispense:  30 capsule    Refill:  2  . DISCONTD: albuterol (PROVENTIL HFA;VENTOLIN HFA) 108 (90 Base) MCG/ACT inhaler    Sig: Inhale 2 puffs into the lungs every 6 (six) hours as needed for wheezing or shortness of breath.    Dispense:  1 Inhaler    Refill:  2  . citalopram (CELEXA) 40 MG tablet    Sig: TAKE ONE TABLET BY MOUTH EVERY DAY    Dispense:  60 tablet    Refill:  2  . DISCONTD: furosemide (LASIX) 20 MG tablet    Sig: Take 1 tablet (20 mg total) by mouth 2 (two) times daily. 2 tablets in the morning and one at night.    Dispense:  120 tablet    Refill:  3  . lisinopril (PRINIVIL,ZESTRIL) 10 MG tablet    Sig: Take 1 tablet (10 mg total) by mouth daily.    Dispense:  90 tablet    Refill:  2  . lovastatin (MEVACOR) 20 MG tablet    Sig: Take 1 tablet (20 mg total) by mouth at bedtime.    Dispense:  90 tablet    Refill:  [place], DO 09/23/2016, 1:54 PM PGY-3, [place]

## 2016-09-21 NOTE — Patient Instructions (Signed)
   Labs collected today to look into what could be causing fatigue and hand pain  Medications refilled  Increased diabetes medicine  Referral to pain management   Need to follow-up in a month to talk about CHF

## 2016-09-22 ENCOUNTER — Telehealth: Payer: Self-pay | Admitting: *Deleted

## 2016-09-22 ENCOUNTER — Other Ambulatory Visit: Payer: Self-pay | Admitting: Obstetrics and Gynecology

## 2016-09-22 LAB — COMPREHENSIVE METABOLIC PANEL
A/G RATIO: 1 — AB (ref 1.2–2.2)
ALK PHOS: 95 IU/L (ref 39–117)
ALT: 59 IU/L — ABNORMAL HIGH (ref 0–32)
AST: 85 IU/L — ABNORMAL HIGH (ref 0–40)
Albumin: 3.9 g/dL (ref 3.6–4.8)
BILIRUBIN TOTAL: 0.4 mg/dL (ref 0.0–1.2)
BUN/Creatinine Ratio: 20 (ref 12–28)
BUN: 14 mg/dL (ref 8–27)
CHLORIDE: 101 mmol/L (ref 96–106)
CO2: 23 mmol/L (ref 20–29)
Calcium: 9 mg/dL (ref 8.7–10.3)
Creatinine, Ser: 0.69 mg/dL (ref 0.57–1.00)
GFR calc Af Amer: 104 mL/min/{1.73_m2} (ref >59)
GFR calc non Af Amer: 90 mL/min/{1.73_m2} (ref >59)
GLUCOSE: 120 mg/dL — AB (ref 65–99)
Globulin, Total: 3.9 g/dL (ref 1.5–4.5)
POTASSIUM: 4 mmol/L (ref 3.5–5.2)
SODIUM: 139 mmol/L (ref 134–144)
Total Protein: 7.8 g/dL (ref 6.0–8.5)

## 2016-09-22 LAB — SEDIMENTATION RATE: Sed Rate: 68 mm/hr — ABNORMAL HIGH (ref 0–40)

## 2016-09-22 LAB — URIC ACID: Uric Acid: 7.9 mg/dL — ABNORMAL HIGH (ref 2.5–7.1)

## 2016-09-22 LAB — CBC
HEMOGLOBIN: 13.2 g/dL (ref 11.1–15.9)
Hematocrit: 38.6 % (ref 34.0–46.6)
MCH: 32 pg (ref 26.6–33.0)
MCHC: 34.2 g/dL (ref 31.5–35.7)
MCV: 94 fL (ref 79–97)
Platelets: 208 10*3/uL (ref 150–379)
RBC: 4.13 x10E6/uL (ref 3.77–5.28)
RDW: 14.1 % (ref 12.3–15.4)
WBC: 8.3 10*3/uL (ref 3.4–10.8)

## 2016-09-22 LAB — VITAMIN B12: VITAMIN B 12: 409 pg/mL (ref 232–1245)

## 2016-09-22 LAB — VITAMIN D 25 HYDROXY (VIT D DEFICIENCY, FRACTURES): VIT D 25 HYDROXY: 28.4 ng/mL — AB (ref 30.0–100.0)

## 2016-09-22 LAB — ANA: Anti Nuclear Antibody(ANA): NEGATIVE

## 2016-09-22 LAB — TSH: TSH: 2.5 u[IU]/mL (ref 0.450–4.500)

## 2016-09-22 LAB — C-REACTIVE PROTEIN: CRP: 11.9 mg/L — AB (ref 0.0–4.9)

## 2016-09-22 LAB — RHEUMATOID FACTOR

## 2016-09-22 NOTE — Telephone Encounter (Signed)
Received faxed request for clarification from [facility] on Proventil and lasix:  1. Insurance prefers Proair HFA, may they change? 2. Lasix 20 mg. "Is patient taking one tab twice daily or 2 tabs in the morning and 1 at night?"  Note routed to PCP. Hubbard Hartshorn, RN, BSN

## 2016-09-23 ENCOUNTER — Other Ambulatory Visit: Payer: Self-pay | Admitting: Obstetrics and Gynecology

## 2016-09-23 MED ORDER — PREDNISONE 50 MG PO TABS: ORAL_TABLET | ORAL | 0 refills | Status: DC

## 2016-09-23 MED ORDER — FUROSEMIDE 20 MG PO TABS: 20.0000 mg | ORAL_TABLET | Freq: Two times a day (BID) | ORAL | 3 refills | Status: DC

## 2016-09-23 NOTE — Telephone Encounter (Signed)
Received fax from [facility] needing clarification on Lasix directions.  Derl Barrow, RN

## 2016-09-23 NOTE — Assessment & Plan Note (Signed)
Does not follow with cardiology. Does not appear symptomatic. To follow-up in 3-4 weeks to discuss with PCP since limited by time today.

## 2016-09-23 NOTE — Assessment & Plan Note (Signed)
A1c today 8.5; not a goal. Increased from prior. Will increase metformin to 1050m BID. Encouraged patient to monitor CBGs. Counseled on weight loss and healthy lifestyle.

## 2016-09-23 NOTE — Telephone Encounter (Signed)
Called and clarified orders. Thanks!

## 2016-09-23 NOTE — Assessment & Plan Note (Signed)
Multifactorial. Probably due to her CHF, poor sleep, and deconditioning/obesity.  Reviewed last sleep study which did not show OSA. Will obtain additional lab work today to include TSH, CBC, CMP, Vit B12, Vit D to rule out other causes.

## 2016-09-23 NOTE — Assessment & Plan Note (Signed)
Chronic, long-standing condition. Referral for pain management placed.

## 2016-09-23 NOTE — Assessment & Plan Note (Signed)
BP controlled. Refills given. Will collect blood work today.

## 2016-09-23 NOTE — Assessment & Plan Note (Signed)
Differential includes gout vs arthritis. Will obtain blood work. Concern for RA in patient with family history. More than likely has OA if any. Will treat accordingly pending labs. See orders for details.

## 2016-09-23 NOTE — Progress Notes (Signed)
Patient is aware. Savannah Burns,CMA

## 2016-10-26 ENCOUNTER — Encounter: Payer: Self-pay | Admitting: Physical Medicine & Rehabilitation

## 2016-11-17 ENCOUNTER — Encounter: Payer: Self-pay | Admitting: Physical Medicine & Rehabilitation

## 2016-11-17 ENCOUNTER — Encounter: Payer: Medicare Other | Attending: Physical Medicine & Rehabilitation

## 2016-11-17 ENCOUNTER — Ambulatory Visit (HOSPITAL_BASED_OUTPATIENT_CLINIC_OR_DEPARTMENT_OTHER): Payer: Medicare Other | Admitting: Physical Medicine & Rehabilitation

## 2016-11-17 VITALS — BP 153/81 | HR 85 | Resp 14

## 2016-11-17 DIAGNOSIS — I10 Essential (primary) hypertension: Secondary | ICD-10-CM | POA: Insufficient documentation

## 2016-11-17 DIAGNOSIS — M961 Postlaminectomy syndrome, not elsewhere classified: Secondary | ICD-10-CM | POA: Insufficient documentation

## 2016-11-17 DIAGNOSIS — M545 Low back pain: Secondary | ICD-10-CM | POA: Insufficient documentation

## 2016-11-17 DIAGNOSIS — M47816 Spondylosis without myelopathy or radiculopathy, lumbar region: Secondary | ICD-10-CM | POA: Insufficient documentation

## 2016-11-17 DIAGNOSIS — E119 Type 2 diabetes mellitus without complications: Secondary | ICD-10-CM | POA: Insufficient documentation

## 2016-11-17 DIAGNOSIS — J449 Chronic obstructive pulmonary disease, unspecified: Secondary | ICD-10-CM | POA: Diagnosis not present

## 2016-11-17 DIAGNOSIS — E785 Hyperlipidemia, unspecified: Secondary | ICD-10-CM | POA: Diagnosis not present

## 2016-11-17 DIAGNOSIS — Z6841 Body Mass Index (BMI) 40.0 and over, adult: Secondary | ICD-10-CM | POA: Diagnosis not present

## 2016-11-17 DIAGNOSIS — Z87891 Personal history of nicotine dependence: Secondary | ICD-10-CM | POA: Diagnosis not present

## 2016-11-17 DIAGNOSIS — Z981 Arthrodesis status: Secondary | ICD-10-CM | POA: Diagnosis not present

## 2016-11-17 DIAGNOSIS — F329 Major depressive disorder, single episode, unspecified: Secondary | ICD-10-CM | POA: Diagnosis not present

## 2016-11-17 MED ORDER — DULOXETINE HCL 20 MG PO CPEP: 20.0000 mg | ORAL_CAPSULE | Freq: Every day | ORAL | 0 refills | Status: DC

## 2016-11-17 NOTE — Progress Notes (Signed)
Subjective:    Patient ID: Savannah Burns, female    DOB: 1950-10-02, 66 y.o.   MRN: [id number]  HPI CC:  Chronic low back pain  Original injury was lifting at work 14-15 year ago, worsening with time. PT and injections were performed first, "waste of time" Then underwent L4-5 fusion ~83yr ago  Never returned to work after fusion Was able to take care of house initially after surgery She is independent with all her self-care and mobility, although she  uses a cane 2-3 years later Pain started to increase once again.  Tried walking and treadmill as well as stationary bike but  Tolerate only 3-4 minutes,    Weight gain of 100lb. over the last several years PT post op, no aquatic  Therapy  Feels very good when in pool  Has not tried acupuncture , DC, or massage  Pain meds initially helpful- tried hydrocodone but then became less effective. Muscle relaxers caused drowsiness NSAIDs not helpful  Seen by physical medicine and rehabilitation physician, Dr. SMaia Petties  He performed medial branch blocks as well as radiofrequency neurotomy. I do not have the actual notes as to which level was treated. Patient states that the treatments were not helpful.  No hip problems No sciatic pain  Pain Inventory Average Pain 8 Pain Right Now 2 My pain is sharp and burning  In the last 24 hours, has pain interfered with the following? General activity 8 Relation with others 5 Enjoyment of life 5 What TIME of day is your pain at its worst? daytime, evening Sleep (in general) Poor  Pain is worse with: walking, bending, standing and some activites Pain improves with: rest Relief from Meds: 0  Mobility walk with assistance use a cane use a walker ability to climb steps?  yes do you drive?  yes Do you have any goals in this area?  yes  Function disabled: date disabled . retired I need assistance with the following:  toileting, household duties and shopping Do you have any goals  in this area?  yes  Neuro/Psych trouble walking depression  Prior Studies new visit CLINICAL DATA:  Lumbar spine surgery 9 years ago. Recurrent lumbar pain over the last year is progressing with time.  EXAM: MRI LUMBAR SPINE WITHOUT CONTRAST  TECHNIQUE: Multiplanar, multisequence MR imaging of the lumbar spine was performed. No intravenous contrast was administered.  COMPARISON:  None.  FINDINGS: Normal signal is present in the conus medullaris which terminates at L1-2. The patient is status post lumbar fusion at L4-5. Marrow signal, vertebral body heights, alignment is otherwise normal. Limited imaging of the abdomen is unremarkable.  L1-2: Negative.  L2-3: Mild facet hypertrophy is worse on the right. There is no significant stenosis.  L3-4: Mild disc bulging and facet hypertrophy is present. The facet disease is worse on the right. There is no significant stenosis.  L4-5: The patient is fused anteriorly. Pedicle screw and rod fixation is noted. A left laminectomy was performed. No residual or recurrent stenosis is evident.  L5-S1: A shallow central disc protrusion is present. Mild facet hypertrophy is noted bilaterally. There is no significant stenosis.  IMPRESSION: 1. Status post lumbar fusion at L4-5 without residual or recurrent stenosis. 2. Multilevel facet degenerative change without significant focal disc protrusion or stenosis in the remainder of the lumbar spine.   Electronically Signed   By: CLawrence SantiagoM.D.   On: 08/12/2013 15:29 Physicians involved in your care new visit   Family History  Problem  Relation Age of Onset  . Diabetes Father   . Heart disease Father 48       MI  . Hypertension Father   . COPD Sister   . Cancer Paternal Grandmother 5       Pancreatic   Social History   Social History  . Marital status: Widowed    Spouse name: N/A  . Number of children: 2  . Years of education: 34   Social History Main  Topics  . Smoking status: Former Smoker    Packs/day: 1.50    Years: 40.00    Types: Cigarettes    Quit date: 04/29/2015  . Smokeless tobacco: Former Systems developer    Quit date: 04/29/2015     Comment: Quit smoking 04/2015- Previous 1.5 ppd smoker  . Alcohol use No  . Drug use: No  . Sexual activity: Not Currently   Other Topics Concern  . None   Social History Narrative  . None   Past Surgical History:  Procedure Laterality Date  . CESAREAN SECTION    . SPINE SURGERY     Past Medical History:  Diagnosis Date  . COPD (chronic obstructive pulmonary disease) ([place])   . Depression   . Diabetes mellitus without complication ([place])   . Hyperlipidemia   . Hypertension    BP (!) 153/81 (BP Location: Left Arm, Patient Position: Sitting, Cuff Size: Normal)   Pulse 85   Resp 14   SpO2 93%   Opioid Risk Score:   Fall Risk Score:  `1  Depression screen PHQ 2/9  Depression screen [facility] 2/9 11/17/2016 09/08/2015 06/29/2015 04/30/2015 04/17/2015 03/13/2015 12/19/2014  Decreased Interest 1 2 2  0 0 0 0  Down, Depressed, Hopeless 1 1 1  0 0 0 0  PHQ - 2 Score 2 3 3  0 0 0 0  Altered sleeping 3 3 2  - - - -  Tired, decreased energy 1 3 2  - - - -  Change in appetite 1 1 1  - - - -  Feeling bad or failure about yourself  1 0 0 - - - -  Trouble concentrating 0 0 0 - - - -  Moving slowly or fidgety/restless 0 0 0 - - - -  Suicidal thoughts 0 0 0 - - - -  PHQ-9 Score 8 10 8  - - - -  Difficult doing work/chores - Very difficult Somewhat difficult - - - -    Review of Systems  Constitutional: Positive for unexpected weight change.  HENT: Negative.   Eyes: Negative.   Respiratory: Positive for shortness of breath.   Cardiovascular: Positive for leg swelling.  Gastrointestinal: Negative.   Endocrine:       Low blood sugar  Genitourinary: Negative.   Musculoskeletal: Positive for back pain and gait problem.  Skin: Negative.   Allergic/Immunologic: Negative.   Psychiatric/Behavioral: Positive for dysphoric  mood.  All other systems reviewed and are negative.      Objective:   Physical Exam  Constitutional: She is oriented to person, place, and time. She appears well-developed and well-nourished.  Obese  HENT:  Head: Normocephalic and atraumatic.  Eyes: Pupils are equal, round, and reactive to light. Conjunctivae and EOM are normal.  Neck: Normal range of motion. Neck supple. No JVD present.  Cardiovascular: Normal rate and regular rhythm.  Exam reveals no friction rub.   No murmur heard. Pulmonary/Chest: Effort normal and breath sounds normal. No stridor. No respiratory distress. She has no wheezes.  Abdominal: Soft. Bowel sounds  are normal. She exhibits no distension. There is no tenderness.  Neurological: She is oriented to person, place, and time. She displays no tremor. She exhibits normal muscle tone. Gait abnormal.  Reflex Scores:      Patellar reflexes are 1+ on the right side and 1+ on the left side.      Achilles reflexes are 1+ on the right side and 1+ on the left side. Motor strength is 5/5 bilateral hip flexor, knee extensor, ankle dorsiflexor, as well as bilateral deltoid, biceps, triceps, grip Sensation intact to pinprick, bilateral C5, C6, C7, C8, L2, L3, L4, L5, S1 dermatomal distribution. Power flex posture uses a cane. No evidence of toe drag or knee instability.   Psychiatric: She has a normal mood and affect. Her behavior is normal. Judgment and thought content normal.  Nursing note and vitals reviewed.         Assessment & Plan:  1. Lumbar postlaminectomy syndrome, she is primarily axial low back pain, which appears to be below the level of her fusion. For that reason, I am suspicious of sacroiliac disorder. She may benefit from sacroiliac injections but would first need to review prior injection notes to see which levels were treated. L5-S1 level also may be implicated, however, may be difficult to access, L4 medial branch along the L5 area due to obstructing  bone in the region of the medial branch  Would continue with aquatic therapy, mainly on a community basis. She may join a gym or go to the aquatic center once the weather turns cooler and recommending 30 minutes 3 times a week at minimum  We discussed weight loss and her 100 pound weight gain since her injury. Referral made to dietitian to discuss weight loss diet as well as diabetic food choices  We'll trial Cymbalta 20 mg per day, this has dual indication for low back pain as well as depression, some mildly elevated pH, Q 9. Scores  Physical medicine rehabilitation follow-up one month

## 2016-11-17 NOTE — Patient Instructions (Signed)
Referral to nutritional counseling  Continue aquatic therapy at least 3 times a week, try to walk 30 minutes in the pool  Will start Cymbalta, may need to increase dose next month

## 2016-12-15 ENCOUNTER — Encounter: Payer: Self-pay | Admitting: Physical Medicine & Rehabilitation

## 2016-12-15 ENCOUNTER — Telehealth: Payer: Self-pay | Admitting: Family Medicine

## 2016-12-15 ENCOUNTER — Ambulatory Visit (HOSPITAL_BASED_OUTPATIENT_CLINIC_OR_DEPARTMENT_OTHER): Payer: Medicare Other | Admitting: Physical Medicine & Rehabilitation

## 2016-12-15 ENCOUNTER — Encounter: Payer: Medicare Other | Attending: Physical Medicine & Rehabilitation

## 2016-12-15 VITALS — BP 117/76 | HR 88 | Resp 14

## 2016-12-15 DIAGNOSIS — Z981 Arthrodesis status: Secondary | ICD-10-CM | POA: Diagnosis not present

## 2016-12-15 DIAGNOSIS — E119 Type 2 diabetes mellitus without complications: Secondary | ICD-10-CM | POA: Diagnosis not present

## 2016-12-15 DIAGNOSIS — M961 Postlaminectomy syndrome, not elsewhere classified: Secondary | ICD-10-CM | POA: Diagnosis present

## 2016-12-15 DIAGNOSIS — M47816 Spondylosis without myelopathy or radiculopathy, lumbar region: Secondary | ICD-10-CM | POA: Insufficient documentation

## 2016-12-15 DIAGNOSIS — F329 Major depressive disorder, single episode, unspecified: Secondary | ICD-10-CM | POA: Insufficient documentation

## 2016-12-15 DIAGNOSIS — E785 Hyperlipidemia, unspecified: Secondary | ICD-10-CM | POA: Diagnosis not present

## 2016-12-15 DIAGNOSIS — Z6841 Body Mass Index (BMI) 40.0 and over, adult: Secondary | ICD-10-CM | POA: Insufficient documentation

## 2016-12-15 DIAGNOSIS — Z87891 Personal history of nicotine dependence: Secondary | ICD-10-CM | POA: Insufficient documentation

## 2016-12-15 DIAGNOSIS — J449 Chronic obstructive pulmonary disease, unspecified: Secondary | ICD-10-CM | POA: Diagnosis not present

## 2016-12-15 DIAGNOSIS — M545 Low back pain: Secondary | ICD-10-CM | POA: Insufficient documentation

## 2016-12-15 DIAGNOSIS — I10 Essential (primary) hypertension: Secondary | ICD-10-CM | POA: Insufficient documentation

## 2016-12-15 NOTE — Progress Notes (Signed)
Subjective:    Patient ID: Savannah Burns, female    DOB: [birth year], 66 y.o.   MRN: [id number] Original injury was lifting at work 14-15 year ago, worsening with time. PT and injections were performed first, "waste of time" Then underwent L4-5 fusion ~62yr ago HPI Patient overall is feeling better since starting the Cymbalta. She notes that her energy level is better and her pain is also better. I reviewed limited notes from [facility], a L5 transforaminal epidural injection was performed. Patient also noted that she had some type of radiofrequency procedure in the past. However, those notes were not obtained.  Patient is starting to do a little bit more activity She is motivated to lose some weight. She had 100 pound weight gain since surgery 13 years ago Pain Inventory Average Pain 8 Pain Right Now 7 My pain is sharp and burning  In the last 24 hours, has pain interfered with the following? General activity 9 Relation with others 5 Enjoyment of life 7 What TIME of day is your pain at its worst? daytime Sleep (in general) Good  Pain is worse with: walking and standing Pain improves with: rest and medication Relief from Meds: 4  Mobility walk without assistance use a cane use a walker how many minutes can you walk? 5-10 ability to climb steps?  yes do you drive?  yes Do you have any goals in this area?  yes  Function retired I need assistance with the following:  household duties  Neuro/Psych trouble walking depression  Prior Studies Any changes since last visit?  no  Physicians involved in your care Any changes since last visit?  no   Family History  Problem Relation Age of Onset  . Diabetes Father   . Heart disease Father 776      MI  . Hypertension Father   . COPD Sister   . Cancer Paternal Grandmother 82      Pancreatic   Social History   Social History  . Marital status: Widowed    Spouse name: N/A  . Number of children:  2  . Years of education: 111  Social History Main Topics  . Smoking status: Former Smoker    Packs/day: 1.50    Years: 40.00    Types: Cigarettes    Quit date: 04/29/2015  . Smokeless tobacco: Former USystems developer   Quit date: 04/29/2015     Comment: Quit smoking 04/2015- Previous 1.5 ppd smoker  . Alcohol use No  . Drug use: No  . Sexual activity: Not Currently   Other Topics Concern  . None   Social History Narrative  . None   Past Surgical History:  Procedure Laterality Date  . CESAREAN SECTION    . SPINE SURGERY     Past Medical History:  Diagnosis Date  . COPD (chronic obstructive pulmonary disease) ([place]   . Depression   . Diabetes mellitus without complication ([place]   . Hyperlipidemia   . Hypertension    BP 117/76 (BP Location: Left Arm, Patient Position: Sitting, Cuff Size: Large)   Pulse 88   Resp 14   SpO2 93%   Opioid Risk Score:   Fall Risk Score:  `1  Depression screen PHQ 2/9  [facility] 11/17/2016 09/08/2015 06/29/2015 04/30/2015 04/17/2015 03/13/2015 12/19/2014  Decreased Interest 1 2 2  0 0 0 0  Down, Depressed, Hopeless 1 1 1  0 0 0 0  PHQ - 2 Score 2  3 3 0 0 0 0  Altered sleeping 3 3 2  - - - -  Tired, decreased energy 1 3 2  - - - -  Change in appetite 1 1 1  - - - -  Feeling bad or failure about yourself  1 0 0 - - - -  Trouble concentrating 0 0 0 - - - -  Moving slowly or fidgety/restless 0 0 0 - - - -  Suicidal thoughts 0 0 0 - - - -  PHQ-9 Score 8 10 8  - - - -  Difficult doing work/chores - Very difficult Somewhat difficult - - - -    Review of Systems  Constitutional: Positive for unexpected weight change.  HENT: Negative.   Eyes: Negative.   Respiratory: Negative.   Cardiovascular: Negative.   Gastrointestinal: Negative.   Endocrine:       Low blood sugar  Genitourinary: Negative.   Musculoskeletal: Positive for back pain.  Skin: Negative.   Allergic/Immunologic: Negative.   Neurological: Negative.   Hematological: Negative.     Psychiatric/Behavioral: Negative.   All other systems reviewed and are negative.      Objective:   Physical Exam  Constitutional: She is oriented to person, place, and time. She appears well-developed.  Morbid obesity  HENT:  Head: Normocephalic and atraumatic.  Eyes: Pupils are equal, round, and reactive to light. Conjunctivae are normal.  Neck: Normal range of motion.  Musculoskeletal:  Tenderness to palpation over bilateral PSIS. There is also pain at the lumbosacral junction. Limited range of motion with extension, flexion is normal  Neurological: She is alert and oriented to person, place, and time. Gait normal.  Motor strength is 5/5 bilateral hip flexors, knee extension, ankle dorsiflexion.  Psychiatric: She has a normal mood and affect.  Nursing note and vitals reviewed.         Assessment & Plan:  1. 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 She has comorbidities of depressed mood, has responded well to Cymbalta 20 mg per day. Encourage weight loss efforts, referral made to dietary. Instructed patient to ambulate 5 minutes every day and slowly increase by 1 minute every week. Goal at next visit would be close to 20 minutes per day Return to clinic in 3 months

## 2016-12-15 NOTE — Telephone Encounter (Signed)
Clinical info completed on medication form.  Place form in Dr. Stefano Gaul box for completion.  This form will have to be signed by a faculty provider.  Andreas Newport, [place]

## 2016-12-15 NOTE — Patient Instructions (Addendum)
Cont current dose cymbalta  Walk 5 min per day increase by 1 min per week  Please check your messages for the nutrition appointment

## 2016-12-15 NOTE — Telephone Encounter (Signed)
Diabetic supply form dropped off for at front desk for completion.  Verified that patient section of form has been completed.  Last DOS with PCP ([place] at time?) was 02/10/16.  Placed form in team folder to be completed by clinical staff.  Crista Luria

## 2016-12-19 NOTE — Telephone Encounter (Signed)
Form completed and placed in form box in Savannah Burns's office.

## 2016-12-19 NOTE — Telephone Encounter (Signed)
Left voice message for patient that DM supply for was faxed to [facility].  Derl Barrow, RN

## 2016-12-21 ENCOUNTER — Other Ambulatory Visit: Payer: Self-pay | Admitting: Physical Medicine & Rehabilitation

## 2017-01-09 ENCOUNTER — Ambulatory Visit: Payer: Medicare Other | Admitting: Skilled Nursing Facility1

## 2017-02-24 ENCOUNTER — Telehealth: Payer: Self-pay

## 2017-02-24 NOTE — Telephone Encounter (Signed)
Vm was left on pts phone asking her to call the office to schedule an apt with her PCP for health maintenance.

## 2017-02-24 NOTE — Telephone Encounter (Signed)
-----   Message from Steve Rattler, DO sent at 02/23/2017  2:54 PM EST ----- Regarding: needs appt  Needs appt for health maintenance, to discuss CHF

## 2017-03-01 ENCOUNTER — Encounter: Payer: Self-pay | Admitting: Pediatrics

## 2017-03-01 ENCOUNTER — Ambulatory Visit: Payer: Medicare Other | Admitting: Pediatrics

## 2017-03-01 VITALS — BP 126/76 | HR 103 | Temp 98.2°F | Ht 62.0 in | Wt 292.0 lb

## 2017-03-01 DIAGNOSIS — E785 Hyperlipidemia, unspecified: Secondary | ICD-10-CM

## 2017-03-01 DIAGNOSIS — F339 Major depressive disorder, recurrent, unspecified: Secondary | ICD-10-CM | POA: Diagnosis not present

## 2017-03-01 DIAGNOSIS — M545 Low back pain: Secondary | ICD-10-CM

## 2017-03-01 DIAGNOSIS — E119 Type 2 diabetes mellitus without complications: Secondary | ICD-10-CM | POA: Diagnosis not present

## 2017-03-01 DIAGNOSIS — I10 Essential (primary) hypertension: Secondary | ICD-10-CM | POA: Diagnosis not present

## 2017-03-01 DIAGNOSIS — J449 Chronic obstructive pulmonary disease, unspecified: Secondary | ICD-10-CM

## 2017-03-01 DIAGNOSIS — G8929 Other chronic pain: Secondary | ICD-10-CM

## 2017-03-01 LAB — CMP14+EGFR
ALBUMIN: 3.8 g/dL (ref 3.6–4.8)
ALK PHOS: 91 IU/L (ref 39–117)
ALT: 40 IU/L — ABNORMAL HIGH (ref 0–32)
AST: 41 IU/L — ABNORMAL HIGH (ref 0–40)
Albumin/Globulin Ratio: 1 — ABNORMAL LOW (ref 1.2–2.2)
BILIRUBIN TOTAL: 0.3 mg/dL (ref 0.0–1.2)
BUN / CREAT RATIO: 18 (ref 12–28)
BUN: 13 mg/dL (ref 8–27)
CHLORIDE: 97 mmol/L (ref 96–106)
CO2: 27 mmol/L (ref 20–29)
CREATININE: 0.74 mg/dL (ref 0.57–1.00)
Calcium: 9.6 mg/dL (ref 8.7–10.3)
GFR calc Af Amer: 97 mL/min/{1.73_m2} (ref >59)
GFR calc non Af Amer: 84 mL/min/{1.73_m2} (ref >59)
GLOBULIN, TOTAL: 3.8 g/dL (ref 1.5–4.5)
GLUCOSE: 200 mg/dL — AB (ref 65–99)
Potassium: 3.8 mmol/L (ref 3.5–5.2)
SODIUM: 136 mmol/L (ref 134–144)
TOTAL PROTEIN: 7.6 g/dL (ref 6.0–8.5)

## 2017-03-01 LAB — BAYER DCA HB A1C WAIVED: HB A1C (BAYER DCA - WAIVED): 7.7 % — ABNORMAL HIGH (ref <7.0)

## 2017-03-01 MED ORDER — ESCITALOPRAM OXALATE 10 MG PO TABS: 10.0000 mg | ORAL_TABLET | Freq: Every day | ORAL | 5 refills | Status: DC

## 2017-03-01 NOTE — Progress Notes (Signed)
Subjective:   Patient ID: Savannah Burns, female    DOB: [birth year], 66 y.o.   MRN: [id number] CC: New Patient (Initial Visit) and Referral (Pain Clinic)  HPI: [name] is a 66 y.o. female presenting for New Patient (Initial Visit) and Referral (Pain Clinic)  [name] low back pain Has had surgery 13 yrs ago on L3-4 fusion Has had injections both before and after surgery years ago, with minimal improvement hydrocone helped some with pain after the surgery at first, then was having to take daily and not having relief from the pain  Recently seen by PMR, possible sacroiliac joint affecting low back pain Has had significant weight gain over the last few years Patient says she is limited by the back pain with any kind of physical activity  Walking from house to car she is often in tears from the pain  5-10 minutes with a max that she can do Daughter is with her at appointment today  Mood has been down Since she was switched from Lexapro to citalopram a few years ago, she is not sure why Daughter says her mood was much better on the Lexapro Patient wants to switch back to Lexapro Also tried on Zoloft in the past, got much worse on it  Patient says she stays in bed much of the day Says her mood is been down, no thoughts of not wanting to be here  DM2: last check was 8.5 Snacking on foods such as oranges at night Skipping meals often  Has had sleep study in September 2017, told she doesn't have sleep apnea  CHF: Diastolic, last echo in 4098 with grade 2 diastolic dysfunction No swelling Does not check blood pressures at home Taking medicines regularly  COPD: Quit smoking 2 years ago Thinks her breathing has been okay over the last few months Pain in back usually limits her before her breathing does with activities  Depression screen [facility] 2/9 03/01/2017 11/17/2016 09/08/2015 06/29/2015 04/30/2015  Decreased Interest 3 1 2 2  0  Down, Depressed, Hopeless 1 1 1 1  0  PHQ - 2 Score  4 2 3 3  0  Altered sleeping 3 3 3 2  -  Tired, decreased energy 3 1 3 2  -  Change in appetite 2 1 1 1  -  Feeling bad or failure about yourself  0 1 0 0 -  Trouble concentrating 0 0 0 0 -  Moving slowly or fidgety/restless 0 0 0 0 -  Suicidal thoughts 0 0 0 0 -  PHQ-9 Score 12 8 10 8  -  Difficult doing work/chores Somewhat difficult - Very difficult Somewhat difficult -     Past Medical History:  Diagnosis Date  . CHF (congestive heart failure) ([place])   . COPD (chronic obstructive pulmonary disease) ([place])   . Depression   . Diabetes mellitus without complication ([place])   . Hyperlipidemia   . Hypertension    Family History  Problem Relation Age of Onset  . Diabetes Father   . Heart disease Father 2       MI  . Hypertension Father   . COPD Sister   . Cancer Paternal Grandmother 60       Pancreatic   Social History   Socioeconomic History  . Marital status: Widowed    Spouse name: None  . Number of children: 2  . Years of education: 77  . Highest education level: None  Social Needs  . Financial resource strain: None  . Food insecurity -  worry: None  . Food insecurity - inability: None  . Transportation needs - medical: None  . Transportation needs - non-medical: None  Occupational History  . None  Tobacco Use  . Smoking status: Former Smoker    Packs/day: 1.50    Years: 40.00    Pack years: 60.00    Types: Cigarettes    Last attempt to quit: 04/29/2015    Years since quitting: 1.8  . Smokeless tobacco: Former Systems developer    Quit date: 04/29/2015  . Tobacco comment: Quit smoking 04/2015- Previous 1.5 ppd smoker  Substance and Sexual Activity  . Alcohol use: No    Alcohol/week: 0.0 oz  . Drug use: No  . Sexual activity: Not Currently  Other Topics Concern  . None  Social History Narrative  . None   ROS: All systems negative other than what is in HPI  Objective:    BP 126/76   Pulse (!) 103   Temp 98.2 F (36.8 C) (Oral)   Ht 5' 2"  (1.575 m)   Wt 292 lb (132.5  kg)   BMI 53.41 kg/m   Wt Readings from Last 3 Encounters:  03/01/17 292 lb (132.5 kg)  09/21/16 295 lb (133.8 kg)  02/10/16 294 lb (133.4 kg)    Gen: NAD, alert, cooperative with exam, NCAT EYES: EOMI, no conjunctival injection, or no icterus ENT:  OP without erythema LYMPH: no cervical LAD CV: NRRR, normal S1/S2, no murmur, distal pulses 2+ b/l Resp: CTABL, no wheezes, normal WOB Abd: +BS, soft, NTND. Ext: No pitting edema, warm Neuro: Alert and oriented, walks with a cane MSK: normal muscle bulk Psych: Full affect, no thoughts of self-harm  Assessment & Plan:  [name] was seen today for new patient (initial visit) and referral.  Diagnoses and all orders for this visit:  Type 2 diabetes mellitus without complication, without long-term current use of insulin (HCC) A1c 7.7 Discussed healthy food choices Offered referral to nutrition, patient declined Continue metformin Avoid sugary foods and snacking -     Bayer DCA Hb A1c Waived -     Microalbumin / creatinine urine ratio  Chronic obstructive pulmonary disease, unspecified COPD type (HCC) Stable, continue oral corticosteroid  Essential hypertension Adequate control in clinic today Encourage patient to check at home when able -     CMP14+EGFR  Dyslipidemia On statin, continue  Depression, recurrent ([place]) Ongoing symptoms Patient thinks was improved on Lexapro compared to citalopram Discussed the medicines are very similar, patient wants to retry Lexapro She does not think the Cymbalta is helping Stop Cymbalta, citalopram, start below -     escitalopram (LEXAPRO) 10 MG tablet; Take 1 tablet (10 mg total) by mouth daily.  Chronic bilateral low back pain without sciatica Patient wants referral to alternate clinic for evaluation of her back pain Will continue efforts at weight loss is likely contributing to pain -     Ambulatory referral to Pain Clinic   Follow up plan: Return in about 2 months (around  05/02/2017). Assunta Found, MD [place]

## 2017-03-01 NOTE — Patient Instructions (Signed)
Stop Cymbalta Take half tab of citalopram for 8 days (34m) Then stop and start 10 mg of Lexapro once a day

## 2017-03-02 LAB — MICROALBUMIN / CREATININE URINE RATIO
Creatinine, Urine: 177.6 mg/dL
Microalb/Creat Ratio: 8.4 mg/g creat (ref 0.0–30.0)
Microalbumin, Urine: 15 ug/mL

## 2017-03-10 ENCOUNTER — Encounter: Payer: Medicare Other | Attending: Physical Medicine & Rehabilitation

## 2017-03-10 ENCOUNTER — Ambulatory Visit: Payer: Medicare Other | Admitting: Physical Medicine & Rehabilitation

## 2017-03-10 DIAGNOSIS — J449 Chronic obstructive pulmonary disease, unspecified: Secondary | ICD-10-CM | POA: Insufficient documentation

## 2017-03-10 DIAGNOSIS — F329 Major depressive disorder, single episode, unspecified: Secondary | ICD-10-CM | POA: Insufficient documentation

## 2017-03-10 DIAGNOSIS — M961 Postlaminectomy syndrome, not elsewhere classified: Secondary | ICD-10-CM | POA: Insufficient documentation

## 2017-03-10 DIAGNOSIS — Z87891 Personal history of nicotine dependence: Secondary | ICD-10-CM | POA: Insufficient documentation

## 2017-03-10 DIAGNOSIS — E785 Hyperlipidemia, unspecified: Secondary | ICD-10-CM | POA: Insufficient documentation

## 2017-03-10 DIAGNOSIS — I10 Essential (primary) hypertension: Secondary | ICD-10-CM | POA: Insufficient documentation

## 2017-03-10 DIAGNOSIS — M47816 Spondylosis without myelopathy or radiculopathy, lumbar region: Secondary | ICD-10-CM | POA: Insufficient documentation

## 2017-03-10 DIAGNOSIS — E119 Type 2 diabetes mellitus without complications: Secondary | ICD-10-CM | POA: Insufficient documentation

## 2017-03-10 DIAGNOSIS — M545 Low back pain: Secondary | ICD-10-CM | POA: Insufficient documentation

## 2017-03-10 DIAGNOSIS — Z6841 Body Mass Index (BMI) 40.0 and over, adult: Secondary | ICD-10-CM | POA: Insufficient documentation

## 2017-03-10 DIAGNOSIS — Z981 Arthrodesis status: Secondary | ICD-10-CM | POA: Insufficient documentation

## 2017-03-16 ENCOUNTER — Ambulatory Visit: Payer: Medicare Other | Admitting: Physical Medicine & Rehabilitation

## 2017-04-24 ENCOUNTER — Encounter: Payer: Self-pay | Admitting: Pediatrics

## 2017-04-24 ENCOUNTER — Ambulatory Visit: Payer: Medicare Other | Admitting: Pediatrics

## 2017-04-24 VITALS — BP 134/79 | HR 69 | Temp 97.8°F | Ht 62.0 in | Wt 293.0 lb

## 2017-04-24 DIAGNOSIS — M25512 Pain in left shoulder: Secondary | ICD-10-CM

## 2017-04-24 DIAGNOSIS — M25511 Pain in right shoulder: Secondary | ICD-10-CM | POA: Diagnosis not present

## 2017-04-24 NOTE — Progress Notes (Signed)
  Subjective:   Patient ID: Savannah Burns, female    DOB: 1950-07-26, 67 y.o.   MRN: [id number] CC: Shoulder Pain (both shoulder pain, 3 wks, not able to sleep, not able to lift them, no known injuries)  HPI: Savannah Burns is a 67 y.o. female presenting for Shoulder Pain (both shoulder pain, 3 wks, not able to sleep, not able to lift them, no known injuries)  No falls, no injury Noticed over past 3 weeks getting more pain in shoulders Now trying not to move them Cant sleep on her back so sleeping on one shoulder or the other, cant sleep well bc both sides hurt No prior problems with her shoulders Trying to limit what she is doing with her shoulders bc of the pain Doesn't think she has changed activities 3 weeks ago when pain started  Relevant past medical, surgical, family and social history reviewed. Allergies and medications reviewed and updated. Social History   Tobacco Use  Smoking Status Former Smoker  . Packs/day: 1.50  . Years: 40.00  . Pack years: 60.00  . Types: Cigarettes  . Last attempt to quit: 04/29/2015  . Years since quitting: 1.9  Smokeless Tobacco Former Systems developer  . Quit date: 04/29/2015  Tobacco Comment   Quit smoking 04/2015- Previous 1.5 ppd smoker   ROS: Per HPI   Objective:    BP 134/79   Pulse 69   Temp 97.8 F (36.6 C) (Oral)   Ht 5' 2"  (1.575 m)   Wt 293 lb (132.9 kg)   BMI 53.59 kg/m   Wt Readings from Last 3 Encounters:  04/24/17 293 lb (132.9 kg)  03/01/17 292 lb (132.5 kg)  09/21/16 295 lb (133.8 kg)    Gen: NAD, alert, cooperative with exam, NCAT EYES: EOMI, no conjunctival injection, or no icterus CV: NRRR, normal S1/S2 Resp: CTABL, no wheezes, normal WOB Neuro: Alert and oriented, strength equal b/l hand grip MSK: shoulders normal to inspection No redness or swelling Decreased ROM b/l Pain with shoulder abduction greater than 30 degrees, able to lift to about 80 degrees actively b/l before pain is limiting, passive ROM to 90  degrees abduction R, 110 degrees L before limited by pain Extension to apprx 90 degrees active ROM before limited by pain B/l decreased ext and int ROM Some tenderness over [facility] joint b/l  Assessment & Plan:  Savannah Burns was seen today for shoulder pain.  Diagnoses and all orders for this visit:  Acute pain of both shoulders Start shoulder ROM today, referral to PT No injuries/falls  Will hold off on xray for now -     Ambulatory referral to Physical Therapy   Follow up plan: Return in about 4 weeks (around 05/22/2017). Assunta Found, MD [place]

## 2017-04-30 ENCOUNTER — Other Ambulatory Visit: Payer: Self-pay | Admitting: Obstetrics and Gynecology

## 2017-05-03 ENCOUNTER — Ambulatory Visit: Payer: Medicare Other | Admitting: Pediatrics

## 2017-05-22 ENCOUNTER — Encounter: Payer: Self-pay | Admitting: Pediatrics

## 2017-05-22 ENCOUNTER — Ambulatory Visit (INDEPENDENT_AMBULATORY_CARE_PROVIDER_SITE_OTHER): Payer: Medicare Other

## 2017-05-22 ENCOUNTER — Ambulatory Visit: Payer: Medicare Other | Admitting: Pediatrics

## 2017-05-22 VITALS — BP 136/74 | HR 85 | Temp 98.2°F | Ht 62.0 in | Wt 292.6 lb

## 2017-05-22 DIAGNOSIS — M25512 Pain in left shoulder: Secondary | ICD-10-CM | POA: Diagnosis not present

## 2017-05-22 DIAGNOSIS — M545 Low back pain: Secondary | ICD-10-CM | POA: Diagnosis not present

## 2017-05-22 DIAGNOSIS — G8929 Other chronic pain: Secondary | ICD-10-CM | POA: Diagnosis not present

## 2017-05-22 MED ORDER — BARIATRIC ROLLATOR MISC: 1.0000 | Freq: Once | 0 refills | Status: DC

## 2017-05-22 MED ORDER — BARIATRIC ROLLATOR MISC: 1.0000 | Freq: Once | 0 refills | Status: AC

## 2017-05-22 NOTE — Progress Notes (Signed)
  Subjective:   Patient ID: Savannah Burns, female    DOB: 30-Sep-1950, 67 y.o.   MRN: [id number] CC: Follow-up (Bilateral Shoulder Pain, Left worsened over 4 weeks)  HPI: Savannah Burns is a 67 y.o. female presenting for Follow-up (Bilateral Shoulder Pain, Left worsened over 4 weeks)  R shoulder is better. L shoulder painful when she moves it. Says she remembers hitting it a couple of weeks before last visit. R shoulder got better with ROM, L shoulder has not improved.   DM2: BGLs in the morning 110s Trying to avoid sugary foods. Had a donut this morning but she says she usually doesn't  Says she lost phone number for [facility] where she was referred for pain management for low back pain  She has been using used rolling walker at home. Not bariatric, wants a larger walker. Gets tired when walking more than 50-100 ft and needs to sit.  Relevant past medical, surgical, family and social history reviewed. Allergies and medications reviewed and updated. Social History   Tobacco Use  Smoking Status Former Smoker  . Packs/day: 1.50  . Years: 40.00  . Pack years: 60.00  . Types: Cigarettes  . Last attempt to quit: 04/29/2015  . Years since quitting: 2.0  Smokeless Tobacco Former Systems developer  . Quit date: 04/29/2015  Tobacco Comment   Quit smoking 04/2015- Previous 1.5 ppd smoker   ROS: Per HPI   Objective:    BP 136/74   Pulse 85   Temp 98.2 F (36.8 C) (Oral)   Ht 5' 2"  (1.575 m)   Wt 292 lb 9.6 oz (132.7 kg)   BMI 53.52 kg/m   Wt Readings from Last 3 Encounters:  05/22/17 292 lb 9.6 oz (132.7 kg)  04/24/17 293 lb (132.9 kg)  03/01/17 292 lb (132.5 kg)    Gen: NAD, alert EYES: EOMI, no conjunctival injection, or no icterus ENT:   OP without erythema LYMPH: no cervical LAD CV: NRRR, normal S1/S2 Resp: CTABL, no wheezes, normal WOB Neuro: Alert and oriented MSK: able to raise R arm over head, prefers to hold L arm bent and against her L side. Pain with slight int  rotation, not able to raise more than 45 degrees from L side.   Assessment & Plan:  Savannah Burns was seen today for follow-up shoulder pain.  Diagnoses and all orders for this visit:  Acute pain of left shoulder -     DG Shoulder Left; Future -     Ambulatory referral to Orthopedic Surgery  Chronic low back pain, unspecified back pain laterality, with sciatica presence unspecified Not able to walk far without walker. Needs to be able to sit down when she needs to. -     Misc. Devices (BARIATRIC ROLLATOR) MISC; 1 each by Does not apply route once for 1 dose.   Follow up plan: Return in about 4 weeks (around 06/19/2017). Assunta Found, MD [place]

## 2017-05-22 NOTE — Patient Instructions (Signed)
[facility] [phone]

## 2017-05-23 ENCOUNTER — Encounter (INDEPENDENT_AMBULATORY_CARE_PROVIDER_SITE_OTHER): Payer: Self-pay | Admitting: Orthopedic Surgery

## 2017-05-23 ENCOUNTER — Ambulatory Visit (INDEPENDENT_AMBULATORY_CARE_PROVIDER_SITE_OTHER): Payer: Medicare Other | Admitting: Orthopedic Surgery

## 2017-05-23 DIAGNOSIS — M7502 Adhesive capsulitis of left shoulder: Secondary | ICD-10-CM

## 2017-05-23 MED ORDER — BUPIVACAINE HCL 0.5 % IJ SOLN
9.0000 mL | INTRAMUSCULAR | Status: AC | PRN
  Administered 2017-05-23: 9 mL via INTRA_ARTICULAR

## 2017-05-23 MED ORDER — LIDOCAINE HCL 1 % IJ SOLN
5.0000 mL | INTRAMUSCULAR | Status: AC | PRN
  Administered 2017-05-23: 5 mL

## 2017-05-23 MED ORDER — METHYLPREDNISOLONE ACETATE 40 MG/ML IJ SUSP
40.0000 mg | INTRAMUSCULAR | Status: AC | PRN
  Administered 2017-05-23: 40 mg via INTRA_ARTICULAR

## 2017-05-23 NOTE — Progress Notes (Signed)
Office Visit Note   Patient: Savannah Burns           Date of Birth: 10-20-1950           MRN: [id number] Visit Date: 05/23/2017 Requested by: Eustaquio Maize, MD [place], [place] [address] PCP: Eustaquio Maize, MD  Subjective: Chief Complaint  Patient presents with  . Right Shoulder - Pain    HPI: [name] is a patient with left shoulder pain.  Been going on for a month.  No history of trauma.  She describes pain as well as decreased range of motion.  Her primary care provider told her that she had a frozen shoulder.  I agree with that assessment.  She takes metformin for diabetes and her blood glucose tends to run low.  She denies any neck pain or radicular symptoms.  She cannot really afford to do any physical therapy.              ROS: All systems reviewed are negative as they relate to the chief complaint within the history of present illness.  Patient denies  fevers or chills.   Assessment & Plan: Visit Diagnoses:  1. Adhesive capsulitis of left shoulder     Plan: Pression is early frozen shoulder on the left-hand side.  Plan is intra-articular injection today.  Home exercise sheet discussed and initiated.  We could consider repeat injection into the shoulder joint in 6 weeks if her symptoms progress.  Discussed with her the natural history of frozen shoulder.  I will see her back as needed  Follow-Up Instructions: Return if symptoms worsen or fail to improve.   Orders:  No orders of the defined types were placed in this encounter.  No orders of the defined types were placed in this encounter.     Procedures: Large Joint Inj: L glenohumeral on 05/23/2017 5:30 PM Indications: diagnostic evaluation and pain Details: 18 G 1.5 in needle, posterior approach  Arthrogram: No  Medications: 9 mL bupivacaine 0.5 %; 40 mg methylPREDNISolone acetate 40 MG/ML; 5 mL lidocaine 1 % Outcome: tolerated well, no immediate complications Procedure, treatment  alternatives, risks and benefits explained, specific risks discussed. Consent was given by the patient. Immediately prior to procedure a time out was called to verify the correct patient, procedure, equipment, support staff and site/side marked as required. Patient was prepped and draped in the usual sterile fashion.       Clinical Data: No additional findings.  Objective: Vital Signs: There were no vitals taken for this visit.  Physical Exam:   Constitutional: Patient appears well-developed HEENT:  Head: Normocephalic Eyes:EOM are normal Neck: Normal range of motion Cardiovascular: Normal rate Pulmonary/chest: Effort normal Neurologic: Patient is alert Skin: Skin is warm Psychiatric: Patient has normal mood and affect    Ortho Exam: Orthopedic exam demonstrates good cervical spine range of motion.  5 out of 5 grip EPL FPL interosseous wrist flexion wrist extension biceps triceps and deltoid strength.  Rotator cuff strength testing is good bilaterally to infraspinatus supraspinatus and subscap muscle testing.  External rotation at 15 degrees of abduction is about 75 on the right compared to 55 on the left.  Glenohumeral forward flexion is 180 on the right compared to 160 on the left.  Isolated glenohumeral abduction is about 100 on the right compared to 80 on the left.  No other masses lymphadenopathy or skin changes noted in the left shoulder girdle region with no AC joint tenderness present  Specialty Comments:  No specialty comments available.  Imaging: No results found.   PMFS History: Patient Active Problem List   Diagnosis Date Noted  . Bilateral hand pain 09/21/2016  . Fatigue 07/30/2015  . Chronic diastolic HF (heart failure) ([place]) 06/29/2015  . Postmenopausal bleeding 04/17/2015  . Excessive daytime sleepiness 12/19/2014  . Swelling of lower extremity 11/27/2014  . Healthcare maintenance 06/27/2014  . Back pain 06/13/2013  . Sinusitis, chronic 05/30/2012  .  Allergic rhinitis 06/06/2011  . Dyslipidemia 03/24/2009  . Diabetes mellitus, type 2 ([place]) 08/12/2008  . Pulmonary nodule 08/29/2007  . COPD (chronic obstructive pulmonary disease) ([place]) 02/14/2007  . OBESITY, NOS 05/25/2006  . TOBACCO DEPENDENCE 05/25/2006  . Depression, recurrent ([place]) 05/25/2006  . HYPERTENSION, BENIGN SYSTEMIC 05/25/2006  . DJD, UNSPECIFIED 05/25/2006   Past Medical History:  Diagnosis Date  . CHF (congestive heart failure) ([place])   . COPD (chronic obstructive pulmonary disease) ([place])   . Depression   . Diabetes mellitus without complication ([place])   . Hyperlipidemia   . Hypertension     Family History  Problem Relation Age of Onset  . Diabetes Father   . Heart disease Father 58       MI  . Hypertension Father   . COPD Sister   . Cancer Paternal Grandmother 42       Pancreatic    Past Surgical History:  Procedure Laterality Date  . CESAREAN SECTION    . SPINE SURGERY     Social History   Occupational History  . Not on file  Tobacco Use  . Smoking status: Former Smoker    Packs/day: 1.50    Years: 40.00    Pack years: 60.00    Types: Cigarettes    Last attempt to quit: 04/29/2015    Years since quitting: 2.0  . Smokeless tobacco: Former Systems developer    Quit date: 04/29/2015  . Tobacco comment: Quit smoking 04/2015- Previous 1.5 ppd smoker  Substance and Sexual Activity  . Alcohol use: No    Alcohol/week: 0.0 oz  . Drug use: No  . Sexual activity: Not Currently

## 2017-06-24 ENCOUNTER — Other Ambulatory Visit: Payer: Self-pay | Admitting: Obstetrics and Gynecology

## 2017-07-08 ENCOUNTER — Other Ambulatory Visit: Payer: Self-pay | Admitting: Obstetrics and Gynecology

## 2017-07-14 ENCOUNTER — Other Ambulatory Visit: Payer: Self-pay | Admitting: Obstetrics and Gynecology

## 2017-08-03 ENCOUNTER — Other Ambulatory Visit: Payer: Self-pay | Admitting: Obstetrics and Gynecology

## 2017-08-04 ENCOUNTER — Other Ambulatory Visit: Payer: Self-pay | Admitting: Obstetrics and Gynecology

## 2017-08-24 LAB — HM DIABETES EYE EXAM

## 2017-09-07 ENCOUNTER — Ambulatory Visit: Payer: Medicare Other | Admitting: Pediatrics

## 2017-09-07 ENCOUNTER — Ambulatory Visit (INDEPENDENT_AMBULATORY_CARE_PROVIDER_SITE_OTHER): Payer: Medicare Other

## 2017-09-07 ENCOUNTER — Encounter: Payer: Self-pay | Admitting: Pediatrics

## 2017-09-07 VITALS — BP 102/64 | HR 110 | Temp 98.4°F | Resp 24 | Ht 62.0 in | Wt 281.0 lb

## 2017-09-07 DIAGNOSIS — R05 Cough: Secondary | ICD-10-CM

## 2017-09-07 DIAGNOSIS — R059 Cough, unspecified: Secondary | ICD-10-CM

## 2017-09-07 DIAGNOSIS — J069 Acute upper respiratory infection, unspecified: Secondary | ICD-10-CM | POA: Diagnosis not present

## 2017-09-07 NOTE — Progress Notes (Signed)
  Subjective:   Patient ID: Savannah Burns, female    DOB: 01-12-1951, 67 y.o.   MRN: [id number] CC: Cough and Shortness of Breath  HPI: Savannah Burns is a 67 y.o. female   About 4 days ago started having URI symptoms including sore throat, nasal congestion, coughing.  The cough is not productive.  She has not had any fevers.  Appetite is been fine.  She does not think she is been wheezing at home.  She has had albuterol inhaler in the past, has been sometime since she was last needed one.  Her voice has been hoarse.  Not taking anything at home.  Relevant past medical, surgical, family and social history reviewed. Allergies and medications reviewed and updated. Social History   Tobacco Use  Smoking Status Former Smoker  . Packs/day: 1.50  . Years: 40.00  . Pack years: 60.00  . Types: Cigarettes  . Last attempt to quit: 04/29/2015  . Years since quitting: 2.3  Smokeless Tobacco Former Systems developer  . Quit date: 04/29/2015  Tobacco Comment   Quit smoking 04/2015- Previous 1.5 ppd smoker   ROS: Per HPI   Objective:    BP 102/64   Pulse (!) 110   Temp 98.4 F (36.9 C) (Oral)   Resp (!) 24   Ht 5' 2"  (1.575 m)   Wt 281 lb (127.5 kg)   SpO2 94%   BMI 51.40 kg/m   Wt Readings from Last 3 Encounters:  09/07/17 281 lb (127.5 kg)  05/22/17 292 lb 9.6 oz (132.7 kg)  04/24/17 293 lb (132.9 kg)    Gen: NAD, alert, cooperative with exam, NCAT EYES: EOMI, no conjunctival injection, or no icterus ENT: R TM slightly injected, nl LR with laying white effusion, nl L TM, OP with mild erythema LYMPH: no cervical LAD CV: NRRR, normal S1/S2, no murmur, distal pulses 2+ b/l Resp: CTABL, no wheezes, normal WOB Abd: +BS, soft, NTND. no guarding or organomegaly Ext: No edema, warm Neuro: Alert and oriented, strength equal b/l UE and LE, coordination grossly normal MSK: normal muscle bulk  Assessment & Plan:  Savannah Burns was seen today for cough and shortness of breath.  Diagnoses and all  orders for this visit:  Acute URI Discussed symptomatic care, will get CXR to rule out pneumonia. Return precautions discussed.  Cough -     DG Chest 2 View; Future  Follow up plan: Return in about 11 days (around 09/18/2017). Assunta Found, MD [place]

## 2017-09-18 ENCOUNTER — Ambulatory Visit: Payer: Medicare Other | Admitting: Pediatrics

## 2017-09-18 ENCOUNTER — Encounter: Payer: Self-pay | Admitting: Pediatrics

## 2017-09-18 VITALS — BP 121/64 | HR 87 | Temp 97.4°F | Resp 24 | Ht 62.0 in | Wt 283.0 lb

## 2017-09-18 DIAGNOSIS — Z6841 Body Mass Index (BMI) 40.0 and over, adult: Secondary | ICD-10-CM

## 2017-09-18 DIAGNOSIS — E119 Type 2 diabetes mellitus without complications: Secondary | ICD-10-CM

## 2017-09-18 DIAGNOSIS — J441 Chronic obstructive pulmonary disease with (acute) exacerbation: Secondary | ICD-10-CM

## 2017-09-18 LAB — BAYER DCA HB A1C WAIVED: HB A1C (BAYER DCA - WAIVED): 6.9 % (ref <7.0)

## 2017-09-18 MED ORDER — AZITHROMYCIN 250 MG PO TABS: ORAL_TABLET | ORAL | 0 refills | Status: DC

## 2017-09-18 NOTE — Progress Notes (Addendum)
  Subjective:   Patient ID: Savannah Burns, female    DOB: 11-Apr-1950, 68 y.o.   MRN: [id number] CC: Follow-up (URI and Blood sugar)  HPI: Savannah Burns is a 67 y.o. female   Diabetes: Says she was much better at avoiding sugary foods when she was first diagnosed with diabetes.  Drinks mostly diet soda.  Eats a fair amount of carbohydrates, thinks she could decrease. Taking metformin regularly. Hoping to start walking regularly at friend's pool or the [facility].  Cough: seen 11 days ago for URI symptoms. She continues to cough throughout day and night. Sometimes productive. Has tried albuterol at home, minimal improvement when she uses it. No fevers.   Relevant past medical, surgical, family and social history reviewed. Allergies and medications reviewed and updated. Social History   Tobacco Use  Smoking Status Former Smoker  . Packs/day: 1.50  . Years: 40.00  . Pack years: 60.00  . Types: Cigarettes  . Last attempt to quit: 04/29/2015  . Years since quitting: 2.3  Smokeless Tobacco Former Systems developer  . Quit date: 04/29/2015  Tobacco Comment   Quit smoking 04/2015- Previous 1.5 ppd smoker   ROS: Per HPI   Objective:    BP 121/64   Pulse 87   Temp (!) 97.4 F (36.3 C) (Oral)   Resp (!) 24   Ht _0  (1.575 m)   Wt 283 lb (128.4 kg)   SpO2 94%   BMI 51.76 kg/m   Wt Readings from Last 3 Encounters:  09/18/17 283 lb (128.4 kg)  09/07/17 281 lb (127.5 kg)  05/22/17 292 lb 9.6 oz (132.7 kg)    Gen: NAD, alert, cooperative with exam, NCAT EYES: EOMI, no conjunctival injection, or no icterus ENT:  TMs pearly gray b/l, OP without erythema LYMPH: no cervical LAD CV: NRRR, normal S1/S2, no murmur, distal pulses 2+ b/l Resp: moving air fair. Wheezes with cough, no wheezing at rest. Slightly prolonged exp phase. comfortable WOB Abd: +BS, soft, NTND. Ext: No pitting edema, warm Neuro: Alert and oriented MSK: normal muscle bulk  Assessment & Plan:  [name] was seen today for  follow-up multiple med problems.  Diagnoses and all orders for this visit:  COPD exacerbation ([place]) Will treat with below. Albuterol as needed.  -     azithromycin (ZITHROMAX) 250 MG tablet; Take 2 the first day and then one each day after.  Type 2 diabetes mellitus without complication, without long-term current use of insulin ([place]) Due for recheck A1c. Cont metformin. -     BMP8+EGFR -     Bayer DCA Hb A1c Waived  BMI 50.0-59.9, adult (HCC) Morbid obesity (HCC) Lifestyle changes discussed.  Patient going to start walking more, avoiding carbohydrates and sugary foods.  Follow up plan: Return in about 3 months (around 12/19/2017). Assunta Found, MD [place]

## 2017-09-19 LAB — BMP8+EGFR
BUN/Creatinine Ratio: 16 (ref 12–28)
BUN: 11 mg/dL (ref 8–27)
CO2: 27 mmol/L (ref 20–29)
CREATININE: 0.67 mg/dL (ref 0.57–1.00)
Calcium: 9 mg/dL (ref 8.7–10.3)
Chloride: 100 mmol/L (ref 96–106)
GFR calc Af Amer: 104 mL/min/{1.73_m2} (ref >59)
GFR, EST NON AFRICAN AMERICAN: 91 mL/min/{1.73_m2} (ref >59)
GLUCOSE: 148 mg/dL — AB (ref 65–99)
Potassium: 4.2 mmol/L (ref 3.5–5.2)
Sodium: 138 mmol/L (ref 134–144)

## 2017-09-21 ENCOUNTER — Encounter: Payer: Self-pay | Admitting: *Deleted

## 2017-12-19 ENCOUNTER — Ambulatory Visit: Payer: Medicare Other | Admitting: Pediatrics

## 2017-12-19 ENCOUNTER — Encounter: Payer: Self-pay | Admitting: Pediatrics

## 2017-12-19 VITALS — BP 137/76 | HR 81 | Temp 98.4°F | Ht 62.0 in | Wt 282.0 lb

## 2017-12-19 DIAGNOSIS — H6063 Unspecified chronic otitis externa, bilateral: Secondary | ICD-10-CM | POA: Diagnosis not present

## 2017-12-19 DIAGNOSIS — F339 Major depressive disorder, recurrent, unspecified: Secondary | ICD-10-CM | POA: Diagnosis not present

## 2017-12-19 DIAGNOSIS — F410 Panic disorder [episodic paroxysmal anxiety] without agoraphobia: Secondary | ICD-10-CM | POA: Diagnosis not present

## 2017-12-19 DIAGNOSIS — M545 Low back pain, unspecified: Secondary | ICD-10-CM

## 2017-12-19 DIAGNOSIS — G8929 Other chronic pain: Secondary | ICD-10-CM

## 2017-12-19 MED ORDER — HYDROCORTISONE-ACETIC ACID 1-2 % OT SOLN: 3.0000 [drp] | Freq: Two times a day (BID) | OTIC | 0 refills | Status: DC

## 2017-12-19 MED ORDER — HYDROXYZINE HCL 10 MG PO TABS: 10.0000 mg | ORAL_TABLET | Freq: Three times a day (TID) | ORAL | 1 refills | Status: DC | PRN

## 2017-12-19 MED ORDER — ESCITALOPRAM OXALATE 20 MG PO TABS: 20.0000 mg | ORAL_TABLET | Freq: Every day | ORAL | 3 refills | Status: DC

## 2017-12-19 NOTE — Progress Notes (Signed)
  Subjective:   Patient ID: Savannah Burns, female    DOB: 1951-02-01, 67 y.o.   MRN: [id number] CC: Medical Management of Chronic Issues and Depression, panic attacks (x 2 weeks after friend died unexpectedly and best friends has terminal illness)  HPI: [name] is a 67 y.o. female   Depression and anxiety: Recently worsening with stress at home, friend illness.  Feeling panicky most the time.  Has been on Lexapro for some time.  Low back pain: Limits her ability to walk regularly.  Takes ibuprofen 500 mg about once a week.  Otitis externa: Symptoms had improved but now getting worse again.  Would like refill on her eardrops.  Relevant past medical, surgical, family and social history reviewed. Allergies and medications reviewed and updated. Social History   Tobacco Use  Smoking Status Former Smoker  . Packs/day: 1.50  . Years: 40.00  . Pack years: 60.00  . Types: Cigarettes  . Last attempt to quit: 04/29/2015  . Years since quitting: 2.6  Smokeless Tobacco Former Systems developer  . Quit date: 04/29/2015  Tobacco Comment   Quit smoking 04/2015- Previous 1.5 ppd smoker   ROS: Per HPI   Objective:    BP 137/76   Pulse 81   Temp 98.4 F (36.9 C) (Oral)   Wt Readings from Last 3 Encounters:  09/18/17 283 lb (128.4 kg)  09/07/17 281 lb (127.5 kg)  05/22/17 292 lb 9.6 oz (132.7 kg)    Gen: NAD, alert, cooperative with exam, NCAT EYES: EOMI, no conjunctival injection, or no icterus ENT:  TMs pearly gray b/l, external ear canals red and irritated appearing b/l, OP without erythema CV: NRRR, normal S1/S2, no murmur, distal pulses 2+ b/l Resp: CTABL, no wheezes, normal WOB Ext: No edema, warm Neuro: Alert and oriented, strength equal b/l UE and LE, coordination grossly normal Psych: tearful at times, no thoughts of self harm, mood is down  Assessment & Plan:  [name] was seen today for medical management of chronic issues and depression, panic attacks.  Diagnoses and all  orders for this visit:  Panic Trial of below, referral to [facility] -     hydrOXYzine (ATARAX/VISTARIL) 10 MG tablet; Take 1 tablet (10 mg total) by mouth 3 (three) times daily as needed.  Depression, recurrent ([place]) Ongoing symptoms, feels safe at home. Increase to 74m -     escitalopram (LEXAPRO) 20 MG tablet; Take 1 tablet (20 mg total) by mouth daily.  Chronic otitis externa of both ears, unspecified type Restart below, has helped with symp in past -     acetic acid-hydrocortisone (VOSOL-HC) OTIC solution; Place 3 drops into both ears 2 (two) times daily.  Chronic midline low back pain without sciatica OK to take ibuprofen 400-6079mtwice a day. F/u 4 weeks for repeat blood work -     Ambulatory referral to Pain Clinic   Follow up plan: Return in about 4 weeks (around 01/16/2018). CaAssunta FoundMD [place]

## 2017-12-19 NOTE — Patient Instructions (Signed)
[facility] Address: [address], [place], [place] [address] Phone: [phone]  Address: [address], [place], [place] [address] Phone: [phone]

## 2017-12-20 ENCOUNTER — Telehealth: Payer: Self-pay | Admitting: Pediatrics

## 2017-12-20 DIAGNOSIS — H6090 Unspecified otitis externa, unspecified ear: Secondary | ICD-10-CM

## 2017-12-20 MED ORDER — NEOMYCIN-POLYMYXIN-HC 3.5-10000-1 OT SOLN: 4.0000 [drp] | Freq: Four times a day (QID) | OTIC | 0 refills | Status: DC

## 2017-12-20 NOTE — Telephone Encounter (Signed)
Alternate sent in

## 2017-12-22 ENCOUNTER — Telehealth: Payer: Self-pay

## 2017-12-22 DIAGNOSIS — F339 Major depressive disorder, recurrent, unspecified: Secondary | ICD-10-CM

## 2017-12-22 NOTE — BH Specialist Note (Signed)
[place] Initial Clinical Assessment  MRN: [id number] NAME: EVALYNN HANKINS Date: 12/22/17   Total time: 1 hour  Type of Contact: Type of Contact: Phone Call Initial Contact Patient consent obtained: Patient consent obtained for Virtual Visit: (NA) Reason for Visit today: Reason for Your Call/Visit Today: [facility] Phone Initial Intake Assessment   Treatment History Patient recently received Inpatient Treatment: Have You Recently Been in Any Inpatient Treatment (Hospital/Detox/Crisis Center/28-Day Program)?: No  Facility/Program:  No  Date of discharge:  No  Patient currently being seen by therapist/psychiatrist: Do You Currently Have a Therapist/Psychiatrist?: No   Patient currently receiving the following services: Patient Currently Receiving the Following Services:: Medication Management(PCP prescribes psychiatric medication )  Psychiatric History  Past Psychiatric History/Hospitalization(s): Anxiety: Yes Bipolar Disorder: No Depression: Yes Mania: No Psychosis: No Schizophrenia: No Personality Disorder: No Hospitalization for psychiatric illness: No History of Electroconvulsive Shock Therapy: No Prior Suicide Attempts: No Decreased need for sleep: No  Euphoria: No Self Injurious behaviors No Family History of mental illness: No Family History of substance abuse: No  Substance Abuse: No  DUI: No  Insomnia: No  History of violence No  Physical, sexual or emotional abuse:No  Prior outpatient mental health therapy: No     Clinical Assessment:  PHQ-9 Assessments: Depression screen [facility] 2/9 12/22/2017 12/19/2017 09/18/2017  Decreased Interest 2 1 0  Down, Depressed, Hopeless 2 3 0  PHQ - 2 Score 4 4 0  Altered sleeping 0 0 -  Tired, decreased energy 0 0 -  Change in appetite 0 1 -  Feeling bad or failure about yourself  2 1 -  Trouble concentrating 0 0 -  Moving slowly or fidgety/restless 0 0 -  Suicidal thoughts 0 0 -  PHQ-9 Score 6 6 -  Difficult  doing work/chores - - -  Some recent data might be hidden    GAD-7 Assessments: GAD 7 : Generalized Anxiety Score 12/22/2017  Nervous, Anxious, on Edge 1  Control/stop worrying 3  Worry too much - different things 1  Trouble relaxing 1  Restless 0  Easily annoyed or irritable 1  Afraid - awful might happen 3  Total GAD 7 Score 10  Anxiety Difficulty Somewhat difficult     Social Functioning Social maturity: Social Maturity: Responsible Social judgement: Social Judgement: Normal  Stress Current stressors: Current Stressors: (Her dog died three weeks ago.  Her friend died last month. Her best friend since Junior High is now in hospitce due to caner.) Familial stressors: Familial Stressors: None Sleep: Sleep: No problems Appetite: Appetite: No problems Coping ability: Coping ability: Exhausted, Overwhelmed  Patient taking medications as prescribed: Patient taking medications as prescribed: Yes  Current medications:  Outpatient Encounter Medications as of 12/22/2017  Medication Sig  . acetic acid-hydrocortisone (VOSOL-HC) OTIC solution Place 3 drops into both ears 2 (two) times daily.  Marland Kitchen aspirin 81 MG tablet Take 81 mg by mouth daily. Reported on 06/01/2015  . Blood Glucose Calibration (GLUCOMETER DEX HIGH CONTROL) LIQD Test CBG's once daily.   Marland Kitchen escitalopram (LEXAPRO) 20 MG tablet Take 1 tablet (20 mg total) by mouth daily.  . furosemide (LASIX) 20 MG tablet Take 1 tablet (20 mg total) by mouth 2 (two) times daily.  Marland Kitchen glucose blood (GLUCOMETER DEX TEST SENSORS) test strip Use as instructed  . hydrOXYzine (ATARAX/VISTARIL) 10 MG tablet Take 1 tablet (10 mg total) by mouth 3 (three) times daily as needed.  Marland Kitchen lisinopril (PRINIVIL,ZESTRIL) 10 MG tablet Take 1 tablet (10  mg total) by mouth daily.  Marland Kitchen lovastatin (MEVACOR) 20 MG tablet Take 1 tablet (20 mg total) by mouth at bedtime.  . metFORMIN (GLUCOPHAGE) 1000 MG tablet Take 1 tablet (1,000 mg total) by mouth 2 (two) times daily with a  meal.  . neomycin-polymyxin-hydrocortisone (CORTISPORIN) OTIC solution Place 4 drops into the right ear 4 (four) times daily.  . [DISCONTINUED] albuterol (PROVENTIL HFA;VENTOLIN HFA) 108 (90 Base) MCG/ACT inhaler Inhale 2 puffs into the lungs every 6 (six) hours as needed for wheezing or shortness of breath.   No facility-administered encounter medications on file as of 2017-12-27.     Self-harm Behaviors Risk Assessment Self-harm risk factors: Self-harm risk factors: (NA) Patient endorses recent thoughts of harming self: Have you recently had any thoughts about harming yourself?: No    [place] Suicide Severity Rating Scale:  C-SRSS 27-Dec-2017  1. Wish to be Dead No  2. Suicidal Thoughts No  6. Suicide Behavior Question No    Danger to Others Risk Assessment Danger to others risk factors: Danger to Others Risk Factors: No risk factors noted Patient endorses recent thoughts of harming others: Notification required: No need or identified person    Substance Use Assessment Patient recently consumed alcohol:  No  Alcohol Use Disorder Identification Test (AUDIT):  Alcohol Use Disorder Test (AUDIT) 2017/12/27  1. How often do you have a drink containing alcohol? 0  2. How many drinks containing alcohol do you have on a typical day when you are drinking? 0  3. How often do you have six or more drinks on one occasion? 0  AUDIT-C Score 0  Intervention/Follow-up AUDIT Score <7 follow-up not indicated   Patient recently used drugs:  No Patient is concerned about dependence or abuse of substances:  No    Goals, Interventions and Follow-up Plan Goals: Increase healthy adjustment to current life circumstances Interventions: Brief CBT, Supportive Counseling and Link to [employer] Follow-up Plan: [facility] Phone Follow Up  Summary of Clinical Assessment  Summary:   Initial Intake Assessment - Patient is a 67 year old female.  Patient started a new psychiatric medication on Wednesday  (12-20-2017).  Patient reports that she has been taking medication since her husband gung himself in a hotel 20-years ago.  Patient reports that she does not remember the names of the psychiatric medication that she has taken in the past..   Patient denies SI/HI/Psychosis/Substance Abuse. If your symptoms worsen or you have thoughts of suicide/homicide, PLEASE SEEK IMMEDIATE MEDICAL ATTENTION.  You may always call:  National Suicide Hotline: 332-234-3686;   Crisis Line: (279)350-5958;  Crisis Recovery in [place]: [phone].  These are available 24 hours a day, 7 days a week.  Patient actively listened as the patient discussed her feelings of depression associated with the death of her dog two weeks ago.  Patient reports that her dog lived for 15 years. Patient reports that a family friend last week. Writer discussed the stages of grief with the patient   Patient reports that since the death of her husband she does not like to be alone.  Patient denies prior inpatient psychiatric hospitalization or outpatient therapy.   Patient lives alone and she has two adult children and two grand children.   Patient lives alone and she received her social security benefits at a lowered amount because she took her benefits early.  Writer discussed factors that caused her to have a strained relationship with her daughter.  Patient reports that she took out second mortgage on  her home in order to assist her daughter with obtaining a home loan.  Patient reports that her daughter has not paid on the mortgage for September.     During the next session the patient will journal when she feels alone.  Patient will make a list of things that she enjoyed doing in the past for enjoyment.Writer provided patient with information to Legal Aid regarding her daughter not paying the mortgage payment.    Graciella Freer [place], LCAS-A

## 2017-12-27 NOTE — Progress Notes (Signed)
Savannah Burns is a 67 y.o. year old female with a history of depression, COPD, type II diabetes, chronic pain. She lost her husband (during separation) from suicide 20 years ago. She also lost her dog. She reports depression in the setting of loneliness. She reports benefit from current dose of Lexapro.   Will not change medication at this time given she reports minimal mood symptoms.    Recommendation - Continue lexapro 20 mg daily  - [place] specialist to coach behavioral activation, provide supportive therapy

## 2018-01-09 ENCOUNTER — Telehealth: Payer: Self-pay

## 2018-01-09 NOTE — Telephone Encounter (Signed)
2nd attempt - [facility]   

## 2018-01-17 ENCOUNTER — Encounter: Payer: Self-pay | Admitting: Pediatrics

## 2018-01-17 ENCOUNTER — Ambulatory Visit: Payer: Medicare Other | Admitting: Pediatrics

## 2018-01-17 VITALS — BP 128/69 | HR 92 | Temp 98.3°F | Ht 62.0 in | Wt 284.0 lb

## 2018-01-17 DIAGNOSIS — F339 Major depressive disorder, recurrent, unspecified: Secondary | ICD-10-CM | POA: Diagnosis not present

## 2018-01-17 DIAGNOSIS — E785 Hyperlipidemia, unspecified: Secondary | ICD-10-CM

## 2018-01-17 DIAGNOSIS — F410 Panic disorder [episodic paroxysmal anxiety] without agoraphobia: Secondary | ICD-10-CM | POA: Diagnosis not present

## 2018-01-17 DIAGNOSIS — Z23 Encounter for immunization: Secondary | ICD-10-CM | POA: Diagnosis not present

## 2018-01-17 DIAGNOSIS — I10 Essential (primary) hypertension: Secondary | ICD-10-CM

## 2018-01-17 DIAGNOSIS — E119 Type 2 diabetes mellitus without complications: Secondary | ICD-10-CM

## 2018-01-17 LAB — BAYER DCA HB A1C WAIVED: HB A1C: 6.8 % (ref <7.0)

## 2018-01-17 MED ORDER — HYDROXYZINE HCL 10 MG PO TABS: 10.0000 mg | ORAL_TABLET | Freq: Three times a day (TID) | ORAL | 1 refills | Status: DC | PRN

## 2018-01-17 MED ORDER — METFORMIN HCL 1000 MG PO TABS: 1000.0000 mg | ORAL_TABLET | Freq: Two times a day (BID) | ORAL | 2 refills | Status: DC

## 2018-01-17 MED ORDER — ESCITALOPRAM OXALATE 20 MG PO TABS: 20.0000 mg | ORAL_TABLET | Freq: Every day | ORAL | 2 refills | Status: DC

## 2018-01-17 MED ORDER — LISINOPRIL 10 MG PO TABS: 10.0000 mg | ORAL_TABLET | Freq: Every day | ORAL | 2 refills | Status: DC

## 2018-01-17 MED ORDER — FUROSEMIDE 20 MG PO TABS: 20.0000 mg | ORAL_TABLET | Freq: Two times a day (BID) | ORAL | 3 refills | Status: DC

## 2018-01-17 MED ORDER — LOVASTATIN 20 MG PO TABS: 20.0000 mg | ORAL_TABLET | Freq: Every day | ORAL | 2 refills | Status: DC

## 2018-01-17 NOTE — Progress Notes (Signed)
  Subjective:   Patient ID: Savannah Burns, female    DOB: [birth year], 67 y.o.   MRN: [id number] CC: Follow-up (1 month) Multiple medical problems HPI: [name] is a 67 y.o. female   Depression and panic: Panic symptoms much improved over the last few weeks.  Stress at home has improved.  Patient says she has been feeling much better.  Chronic back pain: She was able to get into be seen by a pain clinic.  Diabetes: Trying to avoid sugary foods.  Taking medicine regularly.  Hypertension: Taking medicines regularly.  No lightheadedness or dizziness.  No chest pain with exertion.  Hyperlipidemia: Tolerating statin, no side effects  Relevant past medical, surgical, family and social history reviewed. Allergies and medications reviewed and updated. Social History   Tobacco Use  Smoking Status Former Smoker  . Packs/day: 1.50  . Years: 40.00  . Pack years: 60.00  . Types: Cigarettes  . Last attempt to quit: 04/29/2015  . Years since quitting: 2.7  Smokeless Tobacco Former Systems developer  . Quit date: 04/29/2015  Tobacco Comment   Quit smoking 04/2015- Previous 1.5 ppd smoker   ROS: Per HPI   Objective:    BP 128/69   Pulse 92   Temp 98.3 F (36.8 C)   Ht 5' 2"  (1.575 m)   Wt 284 lb (128.8 kg)   BMI 51.94 kg/m   Wt Readings from Last 3 Encounters:  01/17/18 284 lb (128.8 kg)  12/19/17 282 lb (127.9 kg)  09/18/17 283 lb (128.4 kg)    Gen: NAD, alert, NCAT EYES: EOMI, no conjunctival injection, or no icterus ENT: OP without erythema LYMPH: no cervical LAD CV: NRRR, normal S1/S2, no murmur, distal pulses 2+ b/l Resp: CTABL, no wheezes, normal WOB Abd: +BS, soft, NTND.  Ext: No edema, warm Neuro: Alert and oriented MSK: normal muscle bulk  Assessment & Plan:  [name] was seen today for follow-up.  Diagnoses and all orders for this visit:  Type 2 diabetes mellitus without complication, without long-term current use of insulin (HCC) A1c 6.8, stable from last check  6.9.  Continue metformin.  Continue to avoid sugary foods. -     Bayer DCA Hb A1c Waived -     metFORMIN (GLUCOPHAGE) 1000 MG tablet; Take 1 tablet (1,000 mg total) by mouth 2 (two) times daily with a meal.  Panic Symptoms improved, taking below as needed.  Stress has gone down. -     hydrOXYzine (ATARAX/VISTARIL) 10 MG tablet; Take 1 tablet (10 mg total) by mouth 3 (three) times daily as needed.  Depression, recurrent ([place]) Stable on below, continue -     escitalopram (LEXAPRO) 20 MG tablet; Take 1 tablet (20 mg total) by mouth daily.  Dyslipidemia Stable, continue below -     lovastatin (MEVACOR) 20 MG tablet; Take 1 tablet (20 mg total) by mouth at bedtime.  Essential hypertension Stable, continue below -     lisinopril (PRINIVIL,ZESTRIL) 10 MG tablet; Take 1 tablet (10 mg total) by mouth daily. -     furosemide (LASIX) 20 MG tablet; Take 1 tablet (20 mg total) by mouth 2 (two) times daily. -     BMP8+EGFR  Encounter for immunization -     Flu vaccine HIGH DOSE PF   Follow up plan: Return in about 3 months (around 04/19/2018). Assunta Found, MD [place]

## 2018-01-17 NOTE — Patient Instructions (Signed)
Engineer, civil (consulting) Health Contact: [phone]

## 2018-01-18 LAB — BMP8+EGFR
BUN / CREAT RATIO: 15 (ref 12–28)
BUN: 11 mg/dL (ref 8–27)
CHLORIDE: 102 mmol/L (ref 96–106)
CO2: 25 mmol/L (ref 20–29)
CREATININE: 0.72 mg/dL (ref 0.57–1.00)
Calcium: 8.9 mg/dL (ref 8.7–10.3)
GFR calc Af Amer: 100 mL/min/{1.73_m2} (ref >59)
GFR calc non Af Amer: 86 mL/min/{1.73_m2} (ref >59)
GLUCOSE: 124 mg/dL — AB (ref 65–99)
POTASSIUM: 3.8 mmol/L (ref 3.5–5.2)
SODIUM: 142 mmol/L (ref 134–144)

## 2018-01-22 ENCOUNTER — Telehealth: Payer: Self-pay

## 2018-01-22 NOTE — Telephone Encounter (Signed)
Several attempts have been made to contact patient without success. Patient will be placed on the inactive list.  If services are needed again.  Please contact [facility] at [phone].    Information will be routed to the PCP and Dr. Modesta Messing

## 2018-07-02 ENCOUNTER — Other Ambulatory Visit: Payer: Self-pay

## 2018-07-02 ENCOUNTER — Ambulatory Visit (INDEPENDENT_AMBULATORY_CARE_PROVIDER_SITE_OTHER): Payer: Medicare Other | Admitting: Family Medicine

## 2018-07-02 ENCOUNTER — Other Ambulatory Visit: Payer: Self-pay | Admitting: *Deleted

## 2018-07-02 DIAGNOSIS — E1169 Type 2 diabetes mellitus with other specified complication: Secondary | ICD-10-CM

## 2018-07-02 DIAGNOSIS — I5032 Chronic diastolic (congestive) heart failure: Secondary | ICD-10-CM

## 2018-07-02 DIAGNOSIS — E1159 Type 2 diabetes mellitus with other circulatory complications: Secondary | ICD-10-CM

## 2018-07-02 DIAGNOSIS — I152 Hypertension secondary to endocrine disorders: Secondary | ICD-10-CM

## 2018-07-02 DIAGNOSIS — E119 Type 2 diabetes mellitus without complications: Secondary | ICD-10-CM

## 2018-07-02 DIAGNOSIS — E785 Hyperlipidemia, unspecified: Secondary | ICD-10-CM

## 2018-07-02 DIAGNOSIS — I10 Essential (primary) hypertension: Secondary | ICD-10-CM

## 2018-07-02 DIAGNOSIS — F410 Panic disorder [episodic paroxysmal anxiety] without agoraphobia: Secondary | ICD-10-CM

## 2018-07-02 MED ORDER — METFORMIN HCL 1000 MG PO TABS: 1000.0000 mg | ORAL_TABLET | Freq: Two times a day (BID) | ORAL | 1 refills | Status: DC

## 2018-07-02 MED ORDER — HYDROXYZINE HCL 10 MG PO TABS: 10.0000 mg | ORAL_TABLET | Freq: Three times a day (TID) | ORAL | 2 refills | Status: DC | PRN

## 2018-07-02 MED ORDER — FUROSEMIDE 20 MG PO TABS: 20.0000 mg | ORAL_TABLET | Freq: Two times a day (BID) | ORAL | 1 refills | Status: DC

## 2018-07-02 NOTE — Progress Notes (Signed)
Telephone visit  Subjective: CC: Hypertension, HLD, DM2 PCP: [name], DO [id number] Savannah Burns is a 68 y.o. female calls for telephone consult today. Patient provides verbal consent for consult held via phone.  Location of patient: home Location of provider: [facility], working remotely Others present for call: grandson (not listening)  1. Type 2 Diabetes w/ HTN and HLD:  Patient reports she is doing well.  She is taking medication(s): Metformin 1071m BID, Lisinopril 134mqd, Lasix 2013mD (wasn't aware she could take 2x), Lovastatin 38m13mily, Side effects: none  Last eye exam: 07/2017 Last foot exam: due Last A1c:  Lab Results  Component Value Date   HGBA1C 6.8 01/17/2018   Nephropathy screen indicated?: on ACE-I Last flu, zoster and/or pneumovax:  Immunization History  Administered Date(s) Administered  . Influenza Whole 01/04/2008, 03/24/2009  . Influenza, High Dose Seasonal PF 01/17/2018  . Influenza,inj,Quad PF,6+ Mos 11/27/2014  . Influenza-Unspecified 01/26/2013, 03/01/2014  . Pneumococcal Conjugate-13 03/01/2014  . Pneumococcal Polysaccharide-23 09/08/2015  . Td 05/27/2002  . Zoster 02/14/2013    ROS: Denies polyuria, polydipsia, foot ulcerations, SOB or chest pain. She does report some LE edema.  She has good urine output with Lasix 20 mg but is only been taking this once daily as she was unaware that she can take it twice daily.  2. Anxiety Patient reports good control of anxiety symptoms with Lexapro 20 mg daily.  She does have a as needed hydroxyzine prescription that she uses sometimes but not every day.  She does need refills on it.  Denies excessive sedation or dryness.  No Known Allergies Past Medical History:  Diagnosis Date  . CHF (congestive heart failure) (HCC)[place]. COPD (chronic obstructive pulmonary disease) (HCC)[place]. Depression   . Diabetes mellitus without complication (HCC)[place]. Hyperlipidemia   . Hypertension     Current Outpatient  Medications:  .  aspirin 81 MG tablet, Take 81 mg by mouth daily. Reported on 06/01/2015, Disp: , Rfl:  .  Blood Glucose Calibration (GLUCOMETER DEX HIGH CONTROL) LIQD, Test CBG's once daily. , Disp: , Rfl:  .  escitalopram (LEXAPRO) 20 MG tablet, Take 1 tablet (20 mg total) by mouth daily., Disp: 90 tablet, Rfl: 2 .  furosemide (LASIX) 20 MG tablet, Take 1 tablet (20 mg total) by mouth 2 (two) times daily., Disp: 120 tablet, Rfl: 3 .  glucose blood (GLUCOMETER DEX TEST SENSORS) test strip, Use as instructed, Disp: 100 each, Rfl: 1 .  hydrOXYzine (ATARAX/VISTARIL) 10 MG tablet, Take 1 tablet (10 mg total) by mouth 3 (three) times daily as needed., Disp: 60 tablet, Rfl: 1 .  lisinopril (PRINIVIL,ZESTRIL) 10 MG tablet, Take 1 tablet (10 mg total) by mouth daily., Disp: 90 tablet, Rfl: 2 .  lovastatin (MEVACOR) 20 MG tablet, Take 1 tablet (20 mg total) by mouth at bedtime., Disp: 90 tablet, Rfl: 2 .  metFORMIN (GLUCOPHAGE) 1000 MG tablet, Take 1 tablet (1,000 mg total) by mouth 2 (two) times daily with a meal., Disp: 90 tablet, Rfl: 2 .  ondansetron (ZOFRAN-ODT) 4 MG disintegrating tablet, Take 1 tablet by mouth every 8 (eight) hours as needed for nausea/vomiting., Disp: , Rfl:  .  oxyCODONE-acetaminophen (PERCOCET) 10-325 MG tablet, TAKE 1 TABLET BY MOUTH 4 TIMES DAILY AS NEEDED, Disp: , Rfl:  .  tamsulosin (FLOMAX) 0.4 MG CAPS capsule, Take 0.4 mg by mouth daily., Disp: , Rfl:   Assessment/ Plan: 67 y85. female   1. Hypertension associated  with diabetes [facility]) Per report controlled.  She does have some lower extremity edema that is relieved by Lasix.  She was unaware that she can take this twice daily.  We discussed that it is written as such but that I would recommend that she consider taking a multivitamin containing potassium or at least eat something that is potassium rich in her diet if she decides to take this twice daily regularly.  She is on an ACE inhibitor.  No CKD.  Follow-up in 3 to 6  months for labs - furosemide (LASIX) 20 MG tablet; Take 1 tablet (20 mg total) by mouth 2 (two) times daily.  Dispense: 180 tablet; Refill: 1  2. Chronic diastolic HF (heart failure) (HCC) Stable  3. Type 2 diabetes mellitus without complication, without long-term current use of insulin (HCC) Stable.  A1c was within normal range at last visit.  I have refilled her metformin and given her 90-day supply with 1 refill.  Plan for A1c in about 3 to 6 months. - metFORMIN (GLUCOPHAGE) 1000 MG tablet; Take 1 tablet (1,000 mg total) by mouth 2 (two) times daily with a meal.  Dispense: 180 tablet; Refill: 1  4. Hyperlipidemia associated with type 2 diabetes mellitus (HCC) Stable.  Continue Mevacor.  No refills needed  5. Panic Stable with PRN use of hydroxyzine and regular use of SSRI.  No refills needed on SSRI but she is running low on the hydroxyzine.  I have sent this into the pharmacy - hydrOXYzine (ATARAX/VISTARIL) 10 MG tablet; Take 1 tablet (10 mg total) by mouth 3 (three) times daily as needed for anxiety.  Dispense: 60 tablet; Refill: 2   Start time: 11am End time: 11:11am  Total time spent on patient care (including telephone call/ virtual visit): 15 minutes  [place], [place] [phone]

## 2018-07-02 NOTE — Progress Notes (Unsigned)
Scheduled patient for a telephone visit with Dr. Lajuana Ripple today.  Patient requesting 3 month supply on furosemide.  Will pend prescription until after her phone visit with Dr. Lajuana Ripple.

## 2018-09-04 ENCOUNTER — Encounter: Payer: Self-pay | Admitting: Family Medicine

## 2018-10-11 ENCOUNTER — Other Ambulatory Visit: Payer: Self-pay | Admitting: *Deleted

## 2018-10-11 DIAGNOSIS — E1159 Type 2 diabetes mellitus with other circulatory complications: Secondary | ICD-10-CM

## 2018-10-11 DIAGNOSIS — I10 Essential (primary) hypertension: Secondary | ICD-10-CM

## 2018-10-11 DIAGNOSIS — I152 Hypertension secondary to endocrine disorders: Secondary | ICD-10-CM

## 2018-10-11 DIAGNOSIS — E119 Type 2 diabetes mellitus without complications: Secondary | ICD-10-CM

## 2018-10-11 DIAGNOSIS — F339 Major depressive disorder, recurrent, unspecified: Secondary | ICD-10-CM

## 2018-10-11 DIAGNOSIS — E785 Hyperlipidemia, unspecified: Secondary | ICD-10-CM

## 2018-10-11 MED ORDER — LOVASTATIN 20 MG PO TABS: 20.0000 mg | ORAL_TABLET | Freq: Every day | ORAL | 0 refills | Status: DC

## 2018-10-11 MED ORDER — FUROSEMIDE 20 MG PO TABS: 20.0000 mg | ORAL_TABLET | Freq: Two times a day (BID) | ORAL | 0 refills | Status: DC

## 2018-10-11 MED ORDER — ESCITALOPRAM OXALATE 20 MG PO TABS: 20.0000 mg | ORAL_TABLET | Freq: Every day | ORAL | 0 refills | Status: DC

## 2018-10-11 MED ORDER — LISINOPRIL 10 MG PO TABS: 10.0000 mg | ORAL_TABLET | Freq: Every day | ORAL | 0 refills | Status: DC

## 2018-10-11 MED ORDER — METFORMIN HCL 1000 MG PO TABS: 1000.0000 mg | ORAL_TABLET | Freq: Two times a day (BID) | ORAL | 0 refills | Status: DC

## 2018-11-29 ENCOUNTER — Other Ambulatory Visit: Payer: Self-pay | Admitting: Family Medicine

## 2018-11-29 DIAGNOSIS — E785 Hyperlipidemia, unspecified: Secondary | ICD-10-CM

## 2018-11-29 DIAGNOSIS — I10 Essential (primary) hypertension: Secondary | ICD-10-CM

## 2018-11-29 DIAGNOSIS — E119 Type 2 diabetes mellitus without complications: Secondary | ICD-10-CM

## 2018-11-29 DIAGNOSIS — I152 Hypertension secondary to endocrine disorders: Secondary | ICD-10-CM

## 2018-11-29 DIAGNOSIS — E1159 Type 2 diabetes mellitus with other circulatory complications: Secondary | ICD-10-CM

## 2019-02-20 ENCOUNTER — Other Ambulatory Visit: Payer: Self-pay | Admitting: Family Medicine

## 2019-02-20 DIAGNOSIS — F339 Major depressive disorder, recurrent, unspecified: Secondary | ICD-10-CM

## 2019-02-26 ENCOUNTER — Ambulatory Visit (INDEPENDENT_AMBULATORY_CARE_PROVIDER_SITE_OTHER): Payer: Medicare Other | Admitting: Family

## 2019-02-26 ENCOUNTER — Encounter: Payer: Self-pay | Admitting: Family

## 2019-02-26 ENCOUNTER — Telehealth: Payer: Self-pay | Admitting: Family Medicine

## 2019-02-26 DIAGNOSIS — J9811 Atelectasis: Secondary | ICD-10-CM

## 2019-02-26 DIAGNOSIS — J449 Chronic obstructive pulmonary disease, unspecified: Secondary | ICD-10-CM

## 2019-02-26 DIAGNOSIS — Z09 Encounter for follow-up examination after completed treatment for conditions other than malignant neoplasm: Secondary | ICD-10-CM | POA: Diagnosis not present

## 2019-02-26 DIAGNOSIS — N200 Calculus of kidney: Secondary | ICD-10-CM

## 2019-02-26 DIAGNOSIS — N138 Other obstructive and reflux uropathy: Secondary | ICD-10-CM

## 2019-02-26 DIAGNOSIS — N1330 Unspecified hydronephrosis: Secondary | ICD-10-CM | POA: Diagnosis not present

## 2019-02-26 NOTE — Progress Notes (Signed)
   Virtual Visit via telephone Note Due to COVID-19 pandemic this visit was conducted virtually. This visit type was conducted due to national recommendations for restrictions regarding the COVID-19 Pandemic (e.g. social distancing, sheltering in place) in an effort to limit this patient's exposure and mitigate transmission in our community. All issues noted in this document were discussed and addressed.  A physical exam was not performed with this format.  I connected with Savannah Burns on 02/26/19 at 11:00 AM by telephone and verified that I am speaking with the correct person using two identifiers. Savannah Burns is currently located at home and no one is currently with her during visit. The provider, Evelina Dun, FNP is located in their office at time of visit.  I discussed the limitations, risks, security and privacy concerns of performing an evaluation and management service by telephone and the availability of in person appointments. I also discussed with the patient that there may be a patient responsible charge related to this service. The patient expressed understanding and agreed to proceed.   History and Present Illness:  HPI Pt calls the office today for hospital follow up. She went to the ED on 02/20/19 with left lower back pain. She had a CT scan that showed an obstructing 7 mm stone in left kidney and hydronephrosis  of her right kidney.   She had a CT chest scan and found linear atelectasis. She was discharged on Levaquin 750 mg for 5 days and prednisone.   She continues to have SOB, but has COPD  Review of Systems  Respiratory: Positive for cough and shortness of breath.   All other systems reviewed and are negative.    Observations/Objective: No SOB or distress noted   Assessment and Plan: Savannah Burns comes in today with chief complaint of No chief complaint on file.   Diagnosis and orders addressed:  1. Chronic obstructive pulmonary disease,  unspecified COPD type (HC  2. Hospital discharge follow-up  3. Urinary tract obstruction by kidney stone - Ambulatory referral to Urology  4. Hydronephrosis of right kidney - Ambulatory referral to Urology  5. Linear atelectasis  Continue Levaquin and prednisone RTO in 4 weeks to repeat chest x-ray Stat referral to Urologists placed If back pain worsens or unable to urinate go back to ED      I discussed the assessment and treatment plan with the patient. The patient was provided an opportunity to ask questions and all were answered. The patient agreed with the plan and demonstrated an understanding of the instructions.   The patient was advised to call back or seek an in-person evaluation if the symptoms worsen or if the condition fails to improve as anticipated.  The above assessment and management plan was discussed with the patient. The patient verbalized understanding of and has agreed to the management plan. Patient is aware to call the clinic if symptoms persist or worsen. Patient is aware when to return to the clinic for a follow-up visit. Patient educated on when it is appropriate to go to the emergency department.   Time call ended:  11:24 AM  I provided 24 minutes of non-face-to-face time during this encounter.    Evelina Dun, FNP

## 2019-02-28 ENCOUNTER — Other Ambulatory Visit: Payer: Self-pay | Admitting: Urology

## 2019-02-28 ENCOUNTER — Other Ambulatory Visit (HOSPITAL_COMMUNITY)
Admission: RE | Admit: 2019-02-28 | Discharge: 2019-02-28 | Disposition: A | Discharge status: Home / Self Care | Payer: Medicare Other | Source: Ambulatory Visit | Attending: Otolaryngology | Admitting: Otolaryngology

## 2019-02-28 ENCOUNTER — Encounter (HOSPITAL_COMMUNITY)
Admission: RE | Admit: 2019-02-28 | Discharge: 2019-02-28 | Disposition: A | Discharge status: Home / Self Care | Payer: Medicare Other | Source: Ambulatory Visit | Attending: Urology | Admitting: Urology

## 2019-02-28 ENCOUNTER — Other Ambulatory Visit: Payer: Self-pay

## 2019-02-28 ENCOUNTER — Encounter (HOSPITAL_COMMUNITY): Payer: Self-pay

## 2019-02-28 ENCOUNTER — Ambulatory Visit (HOSPITAL_COMMUNITY)
Admission: RE | Admit: 2019-02-28 | Discharge: 2019-02-28 | Disposition: A | Discharge status: Home / Self Care | Payer: Medicare Other | Source: Ambulatory Visit | Attending: Anesthesiology | Admitting: Anesthesiology

## 2019-02-28 DIAGNOSIS — Z01818 Encounter for other preprocedural examination: Secondary | ICD-10-CM | POA: Diagnosis present

## 2019-02-28 DIAGNOSIS — Z7982 Long term (current) use of aspirin: Secondary | ICD-10-CM | POA: Insufficient documentation

## 2019-02-28 DIAGNOSIS — E119 Type 2 diabetes mellitus without complications: Secondary | ICD-10-CM | POA: Diagnosis not present

## 2019-02-28 DIAGNOSIS — N132 Hydronephrosis with renal and ureteral calculous obstruction: Secondary | ICD-10-CM | POA: Diagnosis not present

## 2019-02-28 DIAGNOSIS — Z79899 Other long term (current) drug therapy: Secondary | ICD-10-CM | POA: Insufficient documentation

## 2019-02-28 DIAGNOSIS — E7800 Pure hypercholesterolemia, unspecified: Secondary | ICD-10-CM | POA: Diagnosis not present

## 2019-02-28 DIAGNOSIS — Z20828 Contact with and (suspected) exposure to other viral communicable diseases: Secondary | ICD-10-CM | POA: Insufficient documentation

## 2019-02-28 DIAGNOSIS — I10 Essential (primary) hypertension: Secondary | ICD-10-CM | POA: Insufficient documentation

## 2019-02-28 DIAGNOSIS — Z7984 Long term (current) use of oral hypoglycemic drugs: Secondary | ICD-10-CM | POA: Insufficient documentation

## 2019-02-28 DIAGNOSIS — I700 Atherosclerosis of aorta: Secondary | ICD-10-CM | POA: Diagnosis not present

## 2019-02-28 HISTORY — DX: Low back pain, unspecified: M54.50

## 2019-02-28 HISTORY — DX: Pneumonia, unspecified organism: J18.9

## 2019-02-28 HISTORY — DX: Dyspnea, unspecified: R06.00

## 2019-02-28 HISTORY — DX: Unspecified osteoarthritis, unspecified site: M19.90

## 2019-02-28 HISTORY — DX: Unspecified malignant neoplasm of skin, unspecified: C44.90

## 2019-02-28 LAB — CBC
HCT: 41.4 % (ref 36.0–46.0)
Hemoglobin: 13.1 g/dL (ref 12.0–15.0)
MCH: 31 pg (ref 26.0–34.0)
MCHC: 31.6 g/dL (ref 30.0–36.0)
MCV: 98.1 fL (ref 80.0–100.0)
Platelets: 174 10*3/uL (ref 150–400)
RBC: 4.22 MIL/uL (ref 3.87–5.11)
RDW: 14 % (ref 11.5–15.5)
WBC: 8.6 10*3/uL (ref 4.0–10.5)
nRBC: 0 % (ref 0.0–0.2)

## 2019-02-28 LAB — BASIC METABOLIC PANEL
Anion gap: 8 (ref 5–15)
BUN: 15 mg/dL (ref 8–23)
CO2: 28 mmol/L (ref 22–32)
Calcium: 8.8 mg/dL — ABNORMAL LOW (ref 8.9–10.3)
Chloride: 102 mmol/L (ref 98–111)
Creatinine, Ser: 0.79 mg/dL (ref 0.44–1.00)
GFR calc Af Amer: >60 mL/min (ref >60)
GFR calc non Af Amer: >60 mL/min (ref >60)
Glucose, Bld: 217 mg/dL — ABNORMAL HIGH (ref 70–99)
Potassium: 4.9 mmol/L (ref 3.5–5.1)
Sodium: 138 mmol/L (ref 135–145)

## 2019-02-28 LAB — HEMOGLOBIN A1C
Hgb A1c MFr Bld: 7.2 % — ABNORMAL HIGH (ref 4.8–5.6)
Mean Plasma Glucose: 159.94 mg/dL

## 2019-02-28 LAB — SARS CORONAVIRUS 2 (TAT 6-24 HRS): SARS Coronavirus 2: NEGATIVE

## 2019-02-28 LAB — GLUCOSE, CAPILLARY: Glucose-Capillary: 199 mg/dL — ABNORMAL HIGH (ref 70–99)

## 2019-02-28 NOTE — Progress Notes (Addendum)
PCP - Dr. Lajuana Ripple, Last office visit Myra Gianotti FNP 02/26/2019 in epic Cardiologist - N/A  Chest x-ray - 02/28/2019 in epic EKG - 02/28/2019 in epic Stress Test - N/A ECHO - greater than 2 years Cardiac Cath - N/A  Sleep Study - 12/22/2015 CPAP - No CPAP  Fasting Blood Sugar -  Checks Blood Sugar __4___ times a week  Blood Thinner Instructions: Aspirin Instructions: Last Dose: Last dose 02/25/2019  Anesthesia review: COPD, CHF, DM, HTN, Was in ER 02/20/2019 for SOB recommended for home oxygen use , she is currently taking antibiotics and completed prednisone for early pneumonia  per patient. She does not have a follow up with pulmonologist until Jan 2021  Patient denies shortness of breath, fever, cough and chest pain at PAT appointment   Patient verbalized understanding of instructions that were given to them at the PAT appointment. Patient was also instructed that they will need to review over the PAT instructions again at home before surgery.

## 2019-02-28 NOTE — Patient Instructions (Signed)
DUE TO COVID-19 ONLY ONE VISITOR IS ALLOWED TO COME WITH YOU AND STAY IN THE WAITING ROOM ONLY DURING PRE OP AND PROCEDURE. THE ONE VISITOR MAY VISIT WITH YOU IN YOUR PRIVATE ROOM DURING VISITING HOURS ONLY!!   COVID SWAB TESTING MUST BE COMPLETED ON: Today, Immediately after pre op appointment     [address], [facility] enter pre surgical testing line (Must self quarantine after testing. Follow instructions on handout.)             Your procedure is scheduled on: Friday, Dec. 4, 2020   Report to [facility] Main  Entrance    Report to admitting at 1:45 PM   Call this number if you have problems the morning of surgery [phone]   Do not eat food:After Midnight.   May have liquids until 9:45 AM day of surgery   CLEAR LIQUID DIET  Foods Allowed                                                                     Foods Excluded  Water, Black Coffee and tea, regular and decaf                             liquids that you cannot  Plain Jell-O in any flavor  (No red)                                           see through such as: Fruit ices (not with fruit pulp)                                     milk, soups, orange juice  Iced Popsicles (No red)                                    All solid food Carbonated beverages, regular and diet                                    Apple juices Sports drinks like Gatorade (No red) Lightly seasoned clear broth or consume(fat free) Sugar, honey syrup  Sample Menu Breakfast                                Lunch                                     Supper Cranberry juice                    Beef broth                            Chicken broth Jell-O  Grape juice                           Apple juice Coffee or tea                        Jell-O                                      Popsicle                                                Coffee or tea                         Coffee or tea   Brush your teeth the morning of surgery.   Do NOT smoke after Midnight   Take these medicines the morning of surgery with A SIP OF WATER: Escitalopram, Tamsulosin  DO NOT TAKE ANY DIABETIC MEDICATIONS DAY OF YOUR SURGERY                               You may not have any metal on your body including hair pins, jewelry, and body piercings             Do not wear make-up, lotions, powders, perfumes/cologne, or deodorant             Do not wear nail polish.  Do not shave  48 hours prior to surgery.              Do not bring valuables to the hospital. [place].   Contacts, dentures or bridgework may not be worn into surgery.    Patients discharged the day of surgery will not be allowed to drive home.   Special Instructions: Bring a copy of your healthcare power of attorney and living will documents         the day of surgery if you haven't scanned them in before.              Please read over the following fact sheets you were given:  How to Manage Your Diabetes Before and After Surgery  Why is it important to control my blood sugar before and after surgery? . Improving blood sugar levels before and after surgery helps healing and can limit problems. . A way of improving blood sugar control is eating a healthy diet by: o  Eating less sugar and carbohydrates o  Increasing activity/exercise o  Talking with your doctor about reaching your blood sugar goals . High blood sugars (greater than 180 mg/dL) can raise your risk of infections and slow your recovery, so you will need to focus on controlling your diabetes during the weeks before surgery. . Make sure that the doctor who takes care of your diabetes knows about your planned surgery including the date and location.  How do I manage my blood sugar before surgery? . Check your blood sugar at least 4 times a day, starting 2 days before surgery, to make sure that the level  is not too high or low. o Check your blood sugar the morning of your surgery when you wake up and every 2 hours until you get to the Short Stay unit. . If your blood sugar is less than 70 mg/dL, you will need to treat for low blood sugar: o Do not take insulin. o Treat a low blood sugar (less than 70 mg/dL) with  cup of clear juice (cranberry or apple), 4 glucose tablets, OR glucose gel. o Recheck blood sugar in 15 minutes after treatment (to make sure it is greater than 70 mg/dL). If your blood sugar is not greater than 70 mg/dL on recheck, call [phone] for further instructions. . Report your blood sugar to the short stay nurse when you get to Short Stay.  . If you are admitted to the hospital after surgery: o Your blood sugar will be checked by the staff and you will probably be given insulin after surgery (instead of oral diabetes medicines) to make sure you have good blood sugar levels. o The goal for blood sugar control after surgery is 80-180 mg/dL.   WHAT DO I DO ABOUT MY DIABETES MEDICATION?  Marland Kitchen Take Metformin the day before surgery per normal routine   Do not take oral diabetes medicines (pills) the morning of surgery.  Reviewed and Endorsed by [facility] Patient Education Committee, August 2015  [facility] - Preparing for Surgery Before surgery, you can play an important role.  Because skin is not sterile, your skin needs to be as free of germs as possible.  You can reduce the number of germs on your skin by washing with CHG (chlorahexidine gluconate) soap before surgery.  CHG is an antiseptic cleaner which kills germs and bonds with the skin to continue killing germs even after washing. Please DO NOT use if you have an allergy to CHG or antibacterial soaps.  If your skin becomes reddened/irritated stop using the CHG and inform your nurse when you arrive at Short Stay. Do not shave (including legs and underarms) for at least 48 hours prior to the first CHG shower.  You may  shave your face/neck.  Please follow these instructions carefully:  1.  Shower with CHG Soap the night before surgery and the  morning of surgery.  2.  If you choose to wash your hair, wash your hair first as usual with your normal  shampoo.  3.  After you shampoo, rinse your hair and body thoroughly to remove the shampoo.                             4.  Use CHG as you would any other liquid soap.  You can apply chg directly to the skin and wash.  Gently with a scrungie or clean washcloth.  5.  Apply the CHG Soap to your body ONLY FROM THE NECK DOWN.   Do   not use on face/ open                           Wound or open sores. Avoid contact with eyes, ears mouth and   genitals (private parts).                       Wash face,  Genitals (private parts) with your normal soap.             6.  Wash thoroughly, paying  special attention to the area where your    surgery  will be performed.  7.  Thoroughly rinse your body with warm water from the neck down.  8.  DO NOT shower/wash with your normal soap after using and rinsing off the CHG Soap.                9.  Pat yourself dry with a clean towel.            10.  Wear clean pajamas.            11.  Place clean sheets on your bed the night of your first shower and do not  sleep with pets. Day of Surgery : Do not apply any lotions/deodorants the morning of surgery.  Please wear clean clothes to the hospital/surgery center.  FAILURE TO FOLLOW THESE INSTRUCTIONS MAY RESULT IN THE CANCELLATION OF YOUR SURGERY  PATIENT SIGNATURE_________________________________  NURSE SIGNATURE__________________________________  ________________________________________________________________________

## 2019-02-28 NOTE — Anesthesia Preprocedure Evaluation (Addendum)
Anesthesia Evaluation  Patient identified by MRN, date of birth, ID bandGeneral Assessment Comment:PONV only with C section- no issues with GA since then  Reviewed: Allergy & Precautions, NPO status , Patient's Chart, lab work & pertinent test results  Airway Mallampati: II  TM Distance: >3 FB Neck ROM: Full    Dental  (+) Dental Advisory Given, Upper Dentures   Pulmonary shortness of breath, COPD,  COPD inhaler, former smoker,  Recent COPD exacerbation 02/20/19 seen in ED, recommended home O2. Has pulmonology appt in January   Pulmonary exam normal breath sounds clear to auscultation       Cardiovascular hypertension, Pt. on medications (-) angina+CHF  (-) Past MI Normal cardiovascular exam Rhythm:Regular Rate:Normal     Neuro/Psych PSYCHIATRIC DISORDERS Anxiety Depression negative neurological ROS     GI/Hepatic negative GI ROS, Neg liver ROS,   Endo/Other  diabetes, Type 2, Oral Hypoglycemic AgentsMorbid obesity  Renal/GU negative Renal ROS     Musculoskeletal  (+) Arthritis , Osteoarthritis,    Abdominal   Peds  Hematology negative hematology ROS (+)   Anesthesia Other Findings HLD  Reproductive/Obstetrics                          Anesthesia Physical Anesthesia Plan  ASA: III  Anesthesia Plan: General   Post-op Pain Management:    Induction: Intravenous  PONV Risk Score and Plan: 3 and Ondansetron and Dexamethasone  Airway Management Planned: LMA  Additional Equipment:   Intra-op Plan:   Post-operative Plan: Extubation in OR  Informed Consent: I have reviewed the patients History and Physical, chart, labs and discussed the procedure including the risks, benefits and alternatives for the proposed anesthesia with the patient or authorized representative who has indicated his/her understanding and acceptance.     Dental advisory given  Plan Discussed with: CRNA  Anesthesia  Plan Comments: (See PAT note 02/28/2019, Konrad Felix, PA-C)      Anesthesia Quick Evaluation

## 2019-02-28 NOTE — Progress Notes (Addendum)
Anesthesia Chart Review   Case: [id number] Date/Time: 03/01/19 1530   Procedure: CYSTOSCOPY/RETROGRADEURETEROSCOPY/HOLMIUM LASER/STENT PLACEMENT (Bilateral ) - ONLY NEEDS 60 MIN   Anesthesia type: General   Pre-op diagnosis: LEFT URETERAL STONE, RIGHT URETERAL OBSTRUCTION   Location: [facility] ROOM 06 / [facility]   Surgeon: Ceasar Mons, MD      DISCUSSION:68 y.o. former smoker (60 pack years, quit 04/29/15) with h/o PONV, HTN, HLD, COPD, DM II, CHF, left ureteral stone, right ureteral obstruction scheduled for above procedure 03/01/2019 with Dr. Harrell Gave Lovena Neighbours.   Pt with recent COPD exacerbation, seen in ED 02/20/2019.  Prescribed Levaquin and antibiotics.  She had a follow up televisit with Evelina Dun, NP 02/26/2019.  Per note pt with continued cough and shortness of breath.  She is scheduled for repeat chest xray 04/01/2019.  Pt reports home oxygen was recommended, referred to pulmonologist with appointment in January.  Discussed with Dr. Linna Caprice who states if elective pt should wait minimum two weeks from COPD exacerbation.  Voicemail left with Dr. Jackson Latino scheduler.   VS: BP (!) 173/90   Pulse 69   Temp 37.2 C (Oral)   Resp (!) 22   Ht 5' 2"  (1.575 m)   Wt (!) 136.5 kg   SpO2 96%   BMI 55.05 kg/m   PROVIDERS: [name], DO is PCP    LABS: Labs reviewed: Acceptable for surgery. (all labs ordered are listed, but only abnormal results are displayed)  Labs Reviewed  HEMOGLOBIN A1C - Abnormal; Notable for the following components:      Result Value   Hgb A1c MFr Bld 7.2 (*)    All other components within normal limits  BASIC METABOLIC PANEL - Abnormal; Notable for the following components:   Glucose, Bld 217 (*)    Calcium 8.8 (*)    All other components within normal limits  GLUCOSE, CAPILLARY - Abnormal; Notable for the following components:   Glucose-Capillary 199 (*)    All other components within normal limits  CBC      IMAGES:   EKG: 02/28/2019 Rate 68 bpm Normal sinus rhythm   CV: Echo 01/07/2015 Study Conclusions  - Left ventricle: The cavity size was normal. Wall thickness was   normal. Systolic function was normal. The estimated ejection   fraction was in the range of 60% to 65%. Wall motion was normal;   there were no regional wall motion abnormalities. Features are   consistent with a pseudonormal left ventricular filling pattern,   with concomitant abnormal relaxation and increased filling   pressure (grade 2 diastolic dysfunction). - Aortic valve: Valve area (VTI): 1.98 cm^2. Valve area (Vmean):   1.79 cm^2. - Right atrium: The atrium was mildly dilated. Past Medical History:  Diagnosis Date  . Adrenal adenoma, left    Stable  . Anxiety   . Arthritis    bilateral hands  . CHF (congestive heart failure) ([place])   . COPD (chronic obstructive pulmonary disease) ([place])   . Depression   . Diabetes mellitus without complication ([place])   . Dyspnea   . Grade II diastolic dysfunction   . History of kidney stones   . Hyperlipidemia   . Hypertension   . Lower back pain   . Panic attacks   . Pneumonia    currently taking antibiotic and prednisone for early stages of pneumonia  . PONV (postoperative nausea and vomiting)    after emergency c section no other problems since  . Pulmonary nodules  bilateral  . Skin cancer    face    Past Surgical History:  Procedure Laterality Date  . Breast Cystectomy  Right   . CESAREAN SECTION    . SKIN CANCER EXCISION     Face  . SPINE SURGERY      MEDICATIONS: . acetaminophen (TYLENOL) 500 MG tablet  . aspirin 81 MG tablet  . Blood Glucose Calibration (GLUCOMETER DEX HIGH CONTROL) LIQD  . escitalopram (LEXAPRO) 20 MG tablet  . furosemide (LASIX) 20 MG tablet  . glucose blood (GLUCOMETER DEX TEST SENSORS) test strip  . hydrOXYzine (ATARAX/VISTARIL) 10 MG tablet  . levofloxacin (LEVAQUIN) 750 MG tablet  . lisinopril (ZESTRIL) 10  MG tablet  . lovastatin (MEVACOR) 20 MG tablet  . metFORMIN (GLUCOPHAGE) 1000 MG tablet  . oxyCODONE-acetaminophen (PERCOCET) 10-325 MG tablet  . predniSONE (DELTASONE) 20 MG tablet   No current facility-administered medications for this encounter.    Maia Plan [facility] Pre-Surgical Testing [phone] 02/28/19  3:37 PM

## 2019-02-28 NOTE — Patient Instructions (Signed)
DUE TO COVID-19 ONLY ONE VISITOR IS ALLOWED TO COME WITH YOU AND STAY IN THE WAITING ROOM ONLY DURING PRE OP AND PROCEDURE. THE ONE VISITOR MAY VISIT WITH YOU IN YOUR PRIVATE ROOM DURING VISITING HOURS ONLY!!              COVID SWAB TESTING MUST BE COMPLETED ON: Today, Immediately after pre op appointment     [address] [place] [facility] pre [facility] (Must self quarantine after testing. Follow instructions on handout.)                        Your procedure is scheduled on: Friday, Dec. 4, 2020              Report to [facility] Main  Entrance              Report to admitting at 1:45 PM              Call this number if you have problems the morning of surgery [phone]              Do not eat food:After Midnight.              May have liquids until 9:45 AM day of surgery              CLEAR LIQUID DIET  Foods Allowed                                                                     Foods Excluded  Water, Black Coffee and tea, regular and decaf                             liquids that you cannot  Plain Jell-O in any flavor  (No red)                                           see through such as: Fruit ices (not with fruit pulp)                                     milk, soups, orange juice  Iced Popsicles (No red)                                               All solid food Carbonated beverages, regular and diet                                    Apple juices Sports drinks like Gatorade (No red) Lightly seasoned clear broth or consume(fat free) Sugar, honey syrup  Sample Menu Breakfast  Lunch                                     Supper Cranberry juice                    Beef broth                            Chicken broth Jell-O                                     Grape juice                           Apple juice Coffee or tea                        Jell-O                                       Popsicle                                                Coffee or tea                        Coffee or tea              Brush your teeth the morning of surgery.              Do NOT smoke after Midnight              Take these medicines the morning of surgery with A SIP OF WATER: Escitalopram,  DO NOT TAKE ANY DIABETIC MEDICATIONS DAY OF YOUR SURGERY                               You may not have any metal on your body including hair pins, jewelry, and body piercings             Do not wear make-up, lotions, powders, perfumes/cologne, or deodorant             Do not wear nail polish.  Do not shave  48 hours prior to surgery.                         Do not bring valuables to the hospital. [place].              Contacts, dentures or bridgework may not be worn into surgery.                          Patients discharged the day of surgery will not be allowed to drive home.              Special Instructions: Bring a copy of  your healthcare power of attorney and living will documents         the day of surgery if you haven't scanned them in before.              Please read over the following fact sheets you were given:  [place]  Why is it important to control my blood sugar before and after surgery?  Improving blood sugar levels before and after surgery helps healing and can limit problems.  A way of improving blood sugar control is eating a healthy diet by: ?  Eating less sugar and carbohydrates ?  Increasing activity/exercise ?  Talking with your doctor about reaching your blood sugar goals  High blood sugars (greater than 180 mg/dL) can raise your risk of infections and slow your recovery, so you will need to focus on controlling your diabetes during the weeks before surgery.  Make sure that the doctor who takes care of your diabetes knows about your planned surgery  including the date and location.  How do I manage my blood sugar before surgery?  Check your blood sugar at least 4 times a day, starting 2 days before surgery, to make sure that the level is not too high or low. ? Check your blood sugar the morning of your surgery when you wake up and every 2 hours until you get to the Short Stay unit.  If your blood sugar is less than 70 mg/dL, you will need to treat for low blood sugar: ? Do not take insulin. ? Treat a low blood sugar (less than 70 mg/dL) with  cup of clear juice (cranberry or apple), 4glucose tablets, OR glucose gel. ? Recheck blood sugar in 15 minutes after treatment (to make sure it is greater than 70 mg/dL). If your blood sugar is not greater than 70 mg/dL on recheck, call [phone] for further instructions.  Report your blood sugar to the short stay nurse when you get to Short Stay.   If you are admitted to the hospital after surgery: ? Your blood sugar will be checked by the staff and you will probably be given insulin after surgery (instead of oral diabetes medicines) to make sure you have good blood sugar levels. ? The goal for blood sugar control after surgery is 80-180 mg/dL.   WHAT DO I DO ABOUT MY DIABETES MEDICATION?   Take Metformin the day before surgery per normal routine   Do not take oral diabetes medicines (pills) the morning of surgery.  Reviewed and Endorsed by [facility] Patient Education Committee, August 2015  [facility] - Preparing for Surgery Before surgery, you can play an important role.  Because skin is not sterile, your skin needs to be as free of germs as possible.  You can reduce the number of germs on your skin by washing with CHG (chlorahexidine gluconate) soap before surgery.  CHG is an antiseptic cleaner which kills germs and bonds with the skin to continue killing germs even after washing. Please DO NOT use if you have an allergy to CHG or antibacterial soaps.  If your skin  becomes reddened/irritated stop using the CHG and inform your nurse when you arrive at Short Stay. Do not shave (including legs and underarms) for at least 48 hours prior to the first CHG shower.  You may shave your face/neck.  Please follow these instructions carefully:             1.  Shower with CHG Soap the night before surgery and the  morning of surgery.             2.  If you choose to wash your hair, wash your hair first as usual with your normal  shampoo.             3.  After you shampoo, rinse your hair and body thoroughly to remove the shampoo.                                                 4.  Use CHG as you would any other liquid soap.  You can apply chg directly to the skin and wash.  Gently with a scrungie or clean washcloth.             5.  Apply the CHG Soap to your body ONLY FROM THE NECK DOWN.   Do                not use on face/ open                           Wound or open sores. Avoid contact with eyes, ears mouth and                        genitals (private parts).                       Wash face,  Genitals (private parts) with your normal soap.             6.  Wash thoroughly, paying special attention to the area where your                                     surgery  will be performed.             7.  Thoroughly rinse your body with warm water from the neck down.             8.  DO NOT shower/wash with your normal soap after using and rinsing off the CHG Soap.                9.  Pat yourself dry with a clean towel.            10.  Wear clean pajamas.            11.  Place clean sheets on your bed the night of your first shower and do not  sleep with pets. Day of Surgery : Do not apply any lotions/deodorants the morning of surgery.  Please wear clean clothes to the hospital/surgery center.  FAILURE TO FOLLOW THESE INSTRUCTIONS MAY RESULT IN THE CANCELLATION OF YOUR SURGERY  PATIENT SIGNATURE_________________________________  NURSE  SIGNATURE__________________________________  ________________________________________________________________________

## 2019-03-01 ENCOUNTER — Ambulatory Visit (HOSPITAL_COMMUNITY): Payer: Medicare Other | Admitting: Certified Registered"

## 2019-03-01 ENCOUNTER — Ambulatory Visit (HOSPITAL_COMMUNITY): Payer: Medicare Other

## 2019-03-01 ENCOUNTER — Ambulatory Visit (HOSPITAL_COMMUNITY)
Admission: RE | Admit: 2019-03-01 | Discharge: 2019-03-01 | Disposition: A | Discharge status: Home / Self Care | Payer: Medicare Other | Attending: Urology | Admitting: Urology

## 2019-03-01 ENCOUNTER — Other Ambulatory Visit: Payer: Self-pay

## 2019-03-01 ENCOUNTER — Encounter (HOSPITAL_COMMUNITY): Payer: Self-pay | Admitting: *Deleted

## 2019-03-01 ENCOUNTER — Ambulatory Visit (HOSPITAL_COMMUNITY): Payer: Medicare Other | Admitting: Physician Assistant

## 2019-03-01 ENCOUNTER — Encounter (HOSPITAL_COMMUNITY)
Admission: RE | Disposition: A | Discharge status: Home / Self Care | Payer: Self-pay | Source: Home / Self Care | Attending: Urology

## 2019-03-01 DIAGNOSIS — E7800 Pure hypercholesterolemia, unspecified: Secondary | ICD-10-CM | POA: Insufficient documentation

## 2019-03-01 DIAGNOSIS — Z79899 Other long term (current) drug therapy: Secondary | ICD-10-CM | POA: Diagnosis not present

## 2019-03-01 DIAGNOSIS — Z7982 Long term (current) use of aspirin: Secondary | ICD-10-CM | POA: Diagnosis not present

## 2019-03-01 DIAGNOSIS — F419 Anxiety disorder, unspecified: Secondary | ICD-10-CM | POA: Diagnosis not present

## 2019-03-01 DIAGNOSIS — N132 Hydronephrosis with renal and ureteral calculous obstruction: Secondary | ICD-10-CM | POA: Diagnosis not present

## 2019-03-01 DIAGNOSIS — I509 Heart failure, unspecified: Secondary | ICD-10-CM | POA: Insufficient documentation

## 2019-03-01 DIAGNOSIS — J449 Chronic obstructive pulmonary disease, unspecified: Secondary | ICD-10-CM | POA: Diagnosis not present

## 2019-03-01 DIAGNOSIS — E785 Hyperlipidemia, unspecified: Secondary | ICD-10-CM | POA: Diagnosis not present

## 2019-03-01 DIAGNOSIS — F329 Major depressive disorder, single episode, unspecified: Secondary | ICD-10-CM | POA: Diagnosis not present

## 2019-03-01 DIAGNOSIS — Z7984 Long term (current) use of oral hypoglycemic drugs: Secondary | ICD-10-CM | POA: Insufficient documentation

## 2019-03-01 DIAGNOSIS — I110 Hypertensive heart disease with heart failure: Secondary | ICD-10-CM | POA: Insufficient documentation

## 2019-03-01 DIAGNOSIS — Z87891 Personal history of nicotine dependence: Secondary | ICD-10-CM | POA: Diagnosis not present

## 2019-03-01 DIAGNOSIS — Z6841 Body Mass Index (BMI) 40.0 and over, adult: Secondary | ICD-10-CM | POA: Insufficient documentation

## 2019-03-01 HISTORY — PX: CYSTOSCOPY/URETEROSCOPY/HOLMIUM LASER/STENT PLACEMENT: SHX6546

## 2019-03-01 HISTORY — DX: Panic disorder (episodic paroxysmal anxiety): F41.0

## 2019-03-01 HISTORY — DX: Benign neoplasm of left adrenal gland: D35.02

## 2019-03-01 HISTORY — DX: Other ill-defined heart diseases: I51.89

## 2019-03-01 HISTORY — DX: Other nonspecific abnormal finding of lung field: R91.8

## 2019-03-01 HISTORY — DX: Personal history of urinary calculi: Z87.442

## 2019-03-01 HISTORY — DX: Anxiety disorder, unspecified: F41.9

## 2019-03-01 LAB — GLUCOSE, CAPILLARY
Glucose-Capillary: 75 mg/dL (ref 70–99)
Glucose-Capillary: 76 mg/dL (ref 70–99)
Glucose-Capillary: 77 mg/dL (ref 70–99)
Glucose-Capillary: 95 mg/dL (ref 70–99)

## 2019-03-01 SURGERY — CYSTOSCOPY/URETEROSCOPY/HOLMIUM LASER/STENT PLACEMENT
Anesthesia: General | Laterality: Bilateral

## 2019-03-01 MED ORDER — CEPHALEXIN 500 MG PO CAPS: 500.0000 mg | ORAL_CAPSULE | Freq: Two times a day (BID) | ORAL | 0 refills | Status: AC

## 2019-03-01 MED ORDER — DEXAMETHASONE SODIUM PHOSPHATE 10 MG/ML IJ SOLN
INTRAMUSCULAR | Status: DC | PRN
  Administered 2019-03-01: 10 mg via INTRAVENOUS

## 2019-03-01 MED ORDER — LACTATED RINGERS IV SOLN
INTRAVENOUS | Status: DC
  Administered 2019-03-01: 15:00:00 via INTRAVENOUS

## 2019-03-01 MED ORDER — DEXAMETHASONE SODIUM PHOSPHATE 10 MG/ML IJ SOLN
INTRAMUSCULAR | Status: AC
  Filled 2019-03-01: qty 1

## 2019-03-01 MED ORDER — FENTANYL CITRATE (PF) 100 MCG/2ML IJ SOLN: 25.0000 ug | INTRAMUSCULAR | Status: DC | PRN

## 2019-03-01 MED ORDER — PHENAZOPYRIDINE HCL 200 MG PO TABS: 200.0000 mg | ORAL_TABLET | Freq: Three times a day (TID) | ORAL | 0 refills | Status: DC | PRN

## 2019-03-01 MED ORDER — MIDAZOLAM HCL 5 MG/5ML IJ SOLN
INTRAMUSCULAR | Status: DC | PRN
  Administered 2019-03-01: 2 mg via INTRAVENOUS

## 2019-03-01 MED ORDER — ONDANSETRON HCL 4 MG PO TABS: 4.0000 mg | ORAL_TABLET | Freq: Every day | ORAL | 1 refills | Status: DC | PRN

## 2019-03-01 MED ORDER — PROPOFOL 10 MG/ML IV BOLUS
INTRAVENOUS | Status: DC | PRN
  Administered 2019-03-01: 200 mg via INTRAVENOUS

## 2019-03-01 MED ORDER — FENTANYL CITRATE (PF) 100 MCG/2ML IJ SOLN
INTRAMUSCULAR | Status: AC
  Filled 2019-03-01: qty 2

## 2019-03-01 MED ORDER — LIDOCAINE 2% (20 MG/ML) 5 ML SYRINGE
INTRAMUSCULAR | Status: DC | PRN
  Administered 2019-03-01: 100 mg via INTRAVENOUS

## 2019-03-01 MED ORDER — OXYCODONE-ACETAMINOPHEN 10-325 MG PO TABS: 1.0000 | ORAL_TABLET | ORAL | 0 refills | Status: DC | PRN

## 2019-03-01 MED ORDER — PROPOFOL 10 MG/ML IV BOLUS
INTRAVENOUS | Status: AC
  Filled 2019-03-01: qty 20

## 2019-03-01 MED ORDER — FENTANYL CITRATE (PF) 100 MCG/2ML IJ SOLN
INTRAMUSCULAR | Status: DC | PRN
  Administered 2019-03-01 (×2): 50 ug via INTRAVENOUS

## 2019-03-01 MED ORDER — ONDANSETRON HCL 4 MG/2ML IJ SOLN
INTRAMUSCULAR | Status: DC | PRN
  Administered 2019-03-01: 4 mg via INTRAVENOUS

## 2019-03-01 MED ORDER — MIDAZOLAM HCL 2 MG/2ML IJ SOLN
INTRAMUSCULAR | Status: AC
  Filled 2019-03-01: qty 2

## 2019-03-01 MED ORDER — ONDANSETRON HCL 4 MG/2ML IJ SOLN
INTRAMUSCULAR | Status: AC
  Filled 2019-03-01: qty 2

## 2019-03-01 MED ORDER — ONDANSETRON HCL 4 MG/2ML IJ SOLN: 4.0000 mg | Freq: Once | INTRAMUSCULAR | Status: DC | PRN

## 2019-03-01 MED ORDER — SODIUM CHLORIDE 0.9 % IR SOLN
Status: DC | PRN
  Administered 2019-03-01 (×2): 3000 mL via INTRAVESICAL

## 2019-03-01 MED ORDER — LIDOCAINE 2% (20 MG/ML) 5 ML SYRINGE
INTRAMUSCULAR | Status: AC
  Filled 2019-03-01: qty 5

## 2019-03-01 MED ORDER — PHENYLEPHRINE 40 MCG/ML (10ML) SYRINGE FOR IV PUSH (FOR BLOOD PRESSURE SUPPORT)
PREFILLED_SYRINGE | INTRAVENOUS | Status: DC | PRN
  Administered 2019-03-01: 40 ug via INTRAVENOUS

## 2019-03-01 MED ORDER — CEFAZOLIN SODIUM-DEXTROSE 2-4 GM/100ML-% IV SOLN
2.0000 g | Freq: Once | INTRAVENOUS | Status: AC
  Administered 2019-03-01: 2 g via INTRAVENOUS
  Filled 2019-03-01: qty 100

## 2019-03-01 MED ORDER — OXYBUTYNIN CHLORIDE 5 MG PO TABS: 5.0000 mg | ORAL_TABLET | Freq: Three times a day (TID) | ORAL | 1 refills | Status: DC | PRN

## 2019-03-01 MED ORDER — EPHEDRINE SULFATE-NACL 50-0.9 MG/10ML-% IV SOSY
PREFILLED_SYRINGE | INTRAVENOUS | Status: DC | PRN
  Administered 2019-03-01 (×2): 10 mg via INTRAVENOUS

## 2019-03-01 MED ORDER — IOHEXOL 300 MG/ML  SOLN
INTRAMUSCULAR | Status: DC | PRN
  Administered 2019-03-01: 20 mL via URETHRAL

## 2019-03-01 SURGICAL SUPPLY — 22 items
BAG URO CATCHER STRL LF (MISCELLANEOUS) ×3 IMPLANT
BASKET ZERO TIP NITINOL 2.4FR (BASKET) ×2 IMPLANT
BSKT STON RTRVL ZERO TP 2.4FR (BASKET) ×1
CATH URET 5FR 28IN OPEN ENDED (CATHETERS) ×3 IMPLANT
CLOTH BEACON ORANGE TIMEOUT ST (SAFETY) ×3 IMPLANT
EXTRACTOR STONE NITINOL NGAGE (UROLOGICAL SUPPLIES) IMPLANT
FIBER LASER FLEXIVA 365 (UROLOGICAL SUPPLIES) IMPLANT
FIBER LASER TRAC TIP (UROLOGICAL SUPPLIES) ×2 IMPLANT
GLOVE BIOGEL M STRL SZ7.5 (GLOVE) ×3 IMPLANT
GOWN STRL REUS W/TWL XL LVL3 (GOWN DISPOSABLE) ×3 IMPLANT
GUIDEWIRE STR DUAL SENSOR (WIRE) ×2 IMPLANT
GUIDEWIRE ZIPWRE .038 STRAIGHT (WIRE) ×3 IMPLANT
KIT TURNOVER KIT A (KITS) IMPLANT
MANIFOLD NEPTUNE II (INSTRUMENTS) ×3 IMPLANT
PACK CYSTO (CUSTOM PROCEDURE TRAY) ×3 IMPLANT
PENCIL SMOKE EVACUATOR (MISCELLANEOUS) IMPLANT
SHEATH URETERAL 12FRX35CM (MISCELLANEOUS) IMPLANT
STENT URET 6FRX24 CONTOUR (STENTS) ×4 IMPLANT
STENT URET 6FRX26 CONTOUR (STENTS) IMPLANT
TUBING CONNECTING 10 (TUBING) ×2 IMPLANT
TUBING CONNECTING 10' (TUBING) ×1
TUBING UROLOGY SET (TUBING) ×3 IMPLANT

## 2019-03-01 NOTE — Op Note (Signed)
Operative Note  Preoperative diagnosis:  1.  7 mm left mid ureteral calculus 2.  Right-sided hydronephrosis of unknown etiology  Postoperative diagnosis: 1.  Obstructing and severely impacted 7 mm left ureteral calculus 2.  Negative right ureteroscopy  Procedure(s): 1.  Cystoscopy with bilateral ureteroscopy, holmium laser lithotripsy of the 7 mm left ureteral calculus and bilateral JJ stent placement 2.  Bilateral retrograde pyelograms with intraoperative interpretation of fluoroscopic imaging  Surgeon: Ellison Hughs, MD  Assistants:  None  Anesthesia:  General  Complications:  None  EBL: Less than 5 mL  Specimens: 1.  Left ureteral stone  Drains/Catheters: 1.  Bilateral 6 French, 24 cm JJ stents without tethers  Intraoperative findings:   1. Obstructing and severely impacted 7 mm left mid ureteral calculus  2. No evidence of any soft tissue lesion or obstructing calculus was seen along the entire length of the right ureter 3. Solitary right collecting system with no filling defects or dilation involving the right ureter or right renal pelvis seen on retrograde pyelogram 4. Left retrograde pyelogram revealed a filling defect within the midportion of the left ureter, consistent with her obstructing stone seen on recent CT.  The proximal aspect of the ureter was uniformly dilated along with a dilated left renal pelvis with no other filling defects identified.  Indication:  Savannah Burns is a 68 y.o. female with a 2-week history of worsening left-sided flank pain associated with nausea and vomiting.  She had a CT stone study on 1125 that revealed an obstructing 7 mm left ureteral stone as well as a soft tissue density within the lumen of the right ureter causing mild right-sided hydronephrosis.  She has been consented for the above procedures, voices understanding and wishes to proceed.  Description of procedure:  After informed consent was obtained, the patient was  brought to the operating room and general LMA anesthesia was administered. The patient was then placed in the dorsolithotomy position and prepped and draped in the usual sterile fashion. A timeout was performed. A 23 French rigid cystoscope was then inserted into the urethral meatus and advanced into the bladder under direct vision. A complete bladder survey revealed no intravesical pathology.  A 5 French ureteral catheter was then inserted into the left ureteral orifice and a retrograde pyelogram was obtained, with the findings listed above.  A Glidewire was then used to intubate the lumen of the ureteral catheter and was advanced up to the left renal pelvis, under fluoroscopic guidance.  The catheter was then removed, leaving the wire in place.  A semirigid ureteroscope was then advanced alongside the wire and into the midportion of the left ureter where her obstructing and severely impacted mid ureteral stone was identified.  A 200 m holmium laser was then used to fracture the stone into several smaller pieces.  A nitinol tipless basket was then used to extract all stone fragments from the lumen of the left ureter.  The rigid cystoscope was then placed back in the bladder.  A 5 French ureteral catheter was then inserted into the right ureteral orifice and a retrograde pyelogram was obtained, with the findings listed above.  A Glidewire was then used to intubate the lumen of the ureteral catheter and was advanced up to the right renal pelvis, under fluoroscopic guidance.  The catheter was then removed, leaving the wire in place.  A semirigid ureteroscope was then advanced alongside the wire all the way up to the right UPJ.  No clear etiology of  her mild right-sided hydronephrosis was identified.  There was no soft tissue lesion or signs of an obstructing stone.  I then placed bilateral 6 French, 24 cm JJ stents over each respective wire and into good position within both the right and left collecting  systems, confirming placement via fluoroscopy.  The patient's bladder was drained and all stone fragments were removed.  She tolerated the procedure well and was transferred to the postanesthesia in stable condition.  Plan: Follow-up on 03/18/2019 for cystoscopy and bilateral JJ stent removal

## 2019-03-01 NOTE — H&P (Signed)
PRE-OP H&P  Office Visit Report     02/28/2019   --------------------------------------------------------------------------------   Savannah Burns  MRN: [id number]  DOB: 01/03/1951, 69 year old Female  SSN:    PRIMARY CARE:  Savannah M. Lajuana Ripple, DO  REFERRING:  Savannah Dun, NP  PROVIDER:  Ellison Burns, M.D.  LOCATION:  [facility] [phone]     --------------------------------------------------------------------------------   CC: I have kidney stones.  HPI: [name] is a 68 year-old female patient who was referred by Savannah Dun, NP who is here for renal calculi.  The problem is on the left side. She first stated noticing pain on approximately 02/14/2019. This is her first kidney stone. She is currently having back pain. She denies having flank pain, groin pain, nausea, vomiting, fever, and chills. She has not caught a stone in her urine strainer since her symptoms began.   She has never had surgical treatment for calculi in the past.   -One week hx of sharp, intermittent left sided flank pain.   CT stone study from 02/20/2019 revealed an obstructing 7 mm left ureteral stone as well as a soft tissue density within the lumen of the right ureter causing right-sided hydronephrosis.   Today, the patient reports intermittent episodes of sharp, nonradiating left-sided flank pain without nausea/vomiting or fever/chills. She is urinating without difficulty denies dysuria or hematuria.     ALLERGIES: None   MEDICATIONS: Aspirin 81 mg tablet,chewable  Lisinopril 10 mg tablet  Cortisporin 3.5 mg/gram-10,000 unit/gram-0.5 % cream  Hydrocortisone Acetate  Lasix 20 mg tablet  Lexapro 20 mg tablet  Lovastatin 20 mg tablet  Metformin Er Gastric 1,000 mg tablet, er gastric retention 24 hr  Prednisone  Proventil Hfa 90 mcg hfa aerosol with adapter  Vistaril     GU PSH: None   NON-GU PSH: Breast lumpectomy Cesarean Delivery Lumbar Spine  Fusion     GU PMH: None   NON-GU PMH: Anxiety Congestive heart failure Depression Diabetes Type 2 Hypercholesterolemia Hypertension    FAMILY HISTORY: 2 daughters - No Family History Congestive Heart Failure - Father Disorders Of Blood And Blood-forming Organs - Mother   SOCIAL HISTORY: Marital Status: Widowed Preferred Language: English; Race: White Current Smoking Status: Patient does not smoke anymore.   Tobacco Use Assessment Completed: Used Tobacco in last 30 days? Does not use smokeless tobacco. Drinks 1 drink per year.  Drinks 1 caffeinated drink per day. Has not had a blood transfusion.    REVIEW OF SYSTEMS:    GU Review Female:   Patient denies frequent urination, hard to postpone urination, burning /pain with urination, get up at night to urinate, leakage of urine, stream starts and stops, trouble starting your stream, have to strain to urinate, and being pregnant.  Gastrointestinal (Upper):   Patient denies nausea, vomiting, and indigestion/ heartburn.  Gastrointestinal (Lower):   Patient denies diarrhea and constipation.  Constitutional:   Patient reports fatigue. Patient denies fever, night sweats, and weight loss.  Skin:   Patient denies skin rash/ lesion and itching.  Eyes:   Patient denies double vision and blurred vision.  Ears/ Nose/ Throat:   Patient denies sore throat and sinus problems.  Hematologic/Lymphatic:   Patient denies swollen glands and easy bruising.  Cardiovascular:   Patient reports leg swelling. Patient denies chest pains.  Respiratory:   Patient reports shortness of breath. Patient denies cough.  Endocrine:   Patient denies excessive thirst.  Musculoskeletal:   Patient reports back pain. Patient denies joint pain.  Neurological:   Patient denies headaches and dizziness.  Psychologic:   Patient reports depression and anxiety.    Notes: 88m kidney stones, history kidney stones     VITAL SIGNS:      02/28/2019 08:11 AM  Weight 300 lb /  136.08 kg  Height 62 in / 157.48 cm  BP 167/93 mmHg  Heart Rate 74 /min  Temperature 98.0 F / 36.6 C  BMI 54.9 kg/m   MULTI-SYSTEM PHYSICAL EXAMINATION:    Constitutional: Well-nourished. No physical deformities. Normally developed. Good grooming.  Neck: Neck symmetrical, not swollen. Normal tracheal position.  Respiratory: No labored breathing, no use of accessory muscles.   Cardiovascular: Normal temperature, normal extremity pulses, no swelling, no varicosities.  Skin: No paleness, no jaundice, no cyanosis. No lesion, no ulcer, no rash.  Neurologic / Psychiatric: Oriented to time, oriented to place, oriented to person. No depression, no anxiety, no agitation.  Gastrointestinal: Obese abdomen. No mass, no tenderness, no rigidity.   Eyes: Normal conjunctivae. Normal eyelids.  Musculoskeletal: Normal gait and station of head and neck.     PAST DATA REVIEWED:  Source Of History:  Patient  Notes:                     CLINICAL DATA: Abdominal pain, vomiting, fever, headache. Left  lower quadrant pain. Clinical concern for diverticulitis or small  bowel obstruction.   EXAM:  CT ABDOMEN AND PELVIS WITH CONTRAST   TECHNIQUE:  Multidetector CT imaging of the abdomen and pelvis was performed  using the standard protocol following bolus administration of  intravenous contrast. Performed in conjunction with CTA of the  chest, reported separately.   CONTRAST: 125 cc Isovue 370 IV   COMPARISON: Report from abdominal CT 06/08/2018, images not  available.   FINDINGS:  Lower chest: Assessed on chest CT performed concurrently, reported  separately.   Hepatobiliary: Slight nodular hepatic contours again seen. No focal  hepatic lesion. Gallstones without pericholecystic inflammation. No  biliary dilatation.   Pancreas: No ductal dilatation or inflammation.   Spleen: Normal in size without focal abnormality.   Adrenals/Urinary Tract: 2 7 cm left adrenal nodule, unchanged. Right   adrenal gland is normal. Moderate left hydroureteronephrosis with a  7 mm obstructing stone at the level of the sacrum, series 2, image  59. Punctate nonobstructing stone in the lower left kidney. Mild  left hydronephrosis. Moderate right hydroureteronephrosis. There is  transition at the right pelvic inlet series 2, image 58. No  calcified stone, however there is soft tissue density. Absent  excretion from the left kidney on delayed phase imaging, diminished  excretion from the right kidney. Distal most ureters are  decompressed. Urinary bladder is minimally distended. No bladder  stone.   Stomach/Bowel: Stomach is within normal limits. Appendix appears  normal. No evidence of bowel wall thickening, distention, or  inflammatory changes. No definite diverticular changes.   Vascular/Lymphatic: Moderate abdominal aortic atherosclerosis. No  aortic aneurysm. Prominent left external iliac node measures 14 mm,  series 2, image 66. Prominent right external iliac node measures 12  mm, series 2, image 69. Small retroperitoneal nodes are not enlarged  by size criteria. Prominent 14 mm hepatic caval node series 2, image  25.   Reproductive: Uterus and bilateral adnexa are unremarkable.   Other: No free air or ascites. No intra-abdominal abscess. Fat  containing umbilical hernia.   Musculoskeletal: Postsurgical and degenerative change in the spine.  There are no acute or suspicious osseous abnormalities.  IMPRESSION:  1. Obstructing 7 mm stone in the mid-distal left ureter with  moderate hydroureteronephrosis.  2. Moderate right hydronephrosis without obstructing stone. There is  soft tissue density in the right ureter at the level of the pelvic  inlet which may represent a non radiopaque stone, however ureteral  stricture or neoplasm is also considered. Recommend cystoscopic  evaluation.  3. Unchanged left adrenal nodule, likely adenoma.  4. Slight nodular hepatic contours,  nonspecific but can be seen with  cirrhosis.  5. Cholelithiasis without gallbladder inflammation.  6. Prominent external iliac nodes are likely reactive.   Aortic Atherosclerosis (ICD10-I70.0).    Electronically Signed  By: Keith Rake M.D.  On: 02/20/2019 17:06   PROCEDURES:          Urinalysis Dipstick Dipstick Cont'd  Color: Yellow Bilirubin: Neg mg/dL  Appearance: Clear Ketones: Neg mg/dL  Specific Gravity: 1.025 Blood: Neg ery/uL  pH: 6.5 Protein: Neg mg/dL  Glucose: Neg mg/dL Urobilinogen: 0.2 mg/dL    Nitrites: Neg    Leukocyte Esterase: Neg leu/uL    ASSESSMENT:      ICD-10 Details  1 GU:   Ureteral calculus - N20.1   2   Ureteral obstruction secondary to calculous - N13.2    PLAN:           Orders Labs BUN/Creatinine(Stat)          Schedule Return Visit/Planned Activity: ASAP - Schedule Surgery          Document Letter(s):  Created for Patient: Clinical Summary         Notes:   The risks, benefits and alternatives of cystoscopy with BILATERAL ureteroscopy, laser lithotripsy and ureteral stent placement was discussed the patient. Risks included, but are not limited to: bleeding, urinary tract infection, ureteral injury/avulsion, ureteral stricture formation, retained stone fragments, the possibility that multiple surgeries may be required to treat the stone(s), MI, stroke, PE and the inherent risks of general anesthesia. The patient voices understanding and wishes to proceed.

## 2019-03-01 NOTE — Anesthesia Postprocedure Evaluation (Signed)
Anesthesia Post Note  Patient: Savannah Burns  Procedure(s) Performed: CYSTOSCOPY/RETROGRADEURETEROSCOPY/HOLMIUM LASER/STENT PLACEMENT (Bilateral )     Patient location during evaluation: PACU Anesthesia Type: General Level of consciousness: awake and alert and oriented Pain management: pain level controlled Vital Signs Assessment: post-procedure vital signs reviewed and stable Respiratory status: spontaneous breathing, nonlabored ventilation and respiratory function stable Cardiovascular status: blood pressure returned to baseline Postop Assessment: no apparent nausea or vomiting Anesthetic complications: no    Last Vitals:  Vitals:   03/01/19 1715 03/01/19 1730  BP: (!) 166/101 (!) 147/86  Pulse: 80 82  Resp: (!) 23 (!) 22  Temp:    SpO2: 98% 97%    Last Pain:  Vitals:   03/01/19 1715  TempSrc:   PainSc: 0-No pain                 Brennan Bailey

## 2019-03-01 NOTE — Anesthesia Procedure Notes (Signed)
Procedure Name: LMA Insertion Date/Time: 03/01/2019 3:48 PM Performed by: Mitzie Na, CRNA Pre-anesthesia Checklist: Patient identified, Emergency Drugs available, Suction available, Patient being monitored and Timeout performed Patient Re-evaluated:Patient Re-evaluated prior to induction Oxygen Delivery Method: Circle system utilized Preoxygenation: Pre-oxygenation with 100% oxygen Induction Type: IV induction LMA: LMA inserted LMA Size: 4.0 Number of attempts: 1 Airway Equipment and Method: Stylet and Oral airway Placement Confirmation: positive ETCO2 and breath sounds checked- equal and bilateral Tube secured with: Tape Dental Injury: Teeth and Oropharynx as per pre-operative assessment

## 2019-03-01 NOTE — Transfer of Care (Signed)
Immediate Anesthesia Transfer of Care Note  Patient: [name]  Procedure(s) Performed: CYSTOSCOPY/RETROGRADEURETEROSCOPY/HOLMIUM LASER/STENT PLACEMENT (Bilateral )  Patient Location: PACU  Anesthesia Type:General  Level of Consciousness: awake and patient cooperative  Airway & Oxygen Therapy: Patient Spontanous Breathing and Patient connected to face mask  Post-op Assessment: Report given to RN and Post -op Vital signs reviewed and stable  Post vital signs: Reviewed and stable  Last Vitals:  Vitals Value Taken Time  BP 155/81 03/01/19 1642  Temp    Pulse 81 03/01/19 1643  Resp 19 03/01/19 1643  SpO2 99 % 03/01/19 1643  Vitals shown include unvalidated device data.  Last Pain:  Vitals:   03/01/19 1422  TempSrc:   PainSc: 0-No pain      Patients Stated Pain Goal: 4 ([id number])  Complications: No apparent anesthesia complications

## 2019-03-02 ENCOUNTER — Encounter (HOSPITAL_COMMUNITY): Payer: Self-pay | Admitting: Urology

## 2019-04-01 ENCOUNTER — Ambulatory Visit: Payer: Medicare Other | Admitting: Family Medicine

## 2019-04-17 ENCOUNTER — Encounter: Payer: Self-pay | Admitting: Family Medicine

## 2019-04-17 ENCOUNTER — Ambulatory Visit: Payer: Medicare PPO | Admitting: Family Medicine

## 2019-04-17 ENCOUNTER — Other Ambulatory Visit: Payer: Self-pay

## 2019-04-17 VITALS — BP 121/70 | HR 109 | Temp 98.9°F | Ht 62.0 in | Wt 298.0 lb

## 2019-04-17 DIAGNOSIS — I5032 Chronic diastolic (congestive) heart failure: Secondary | ICD-10-CM | POA: Diagnosis not present

## 2019-04-17 DIAGNOSIS — J449 Chronic obstructive pulmonary disease, unspecified: Secondary | ICD-10-CM

## 2019-04-17 DIAGNOSIS — R0902 Hypoxemia: Secondary | ICD-10-CM | POA: Diagnosis not present

## 2019-04-17 DIAGNOSIS — R600 Localized edema: Secondary | ICD-10-CM | POA: Diagnosis not present

## 2019-04-17 NOTE — Patient Instructions (Addendum)
See me back in 2 months to follow up on your blood sugar.  Your A1c in the hospital was 7.2.  This is above your goal of 7.  We will plan to check your feet at next visit.  If sugar is still above goal, we may consider adding a medication that would also help with weight.  Please make sure that you schedule your: -diabetic eye exam -mammogram  -colonoscopy

## 2019-04-17 NOTE — Progress Notes (Signed)
Subjective: CC: ?needs oxygen PCP: [name], DO SWF:[id number] Savannah Burns is a 69 y.o. female presenting to clinic today for:  1.  Hypoxia Patient reports that when she was admitted recently for renal stone that she was told she needs to be on supplemental O2.  She is never required oxygen in the past but notes that she is always been dyspneic with exertion.  There is a question of possible COPD but she notes that she had pulmonary function test done many years ago which were negative.  She was recently diagnosed with heart failure.  She does have chronic lower extremity edema.  She takes Lasix and ACE inhibitor.  She is not established with cardiology but would like to be.  She is never seen a pulmonologist to her knowledge.  She would like to go to [facility] for both of these specialists.   ROS: Per HPI  Allergies  Allergen Reactions  . Keflex [Cephalexin] Nausea And Vomiting   Past Medical History:  Diagnosis Date  . Adrenal adenoma, left    Stable  . Anxiety   . Arthritis    bilateral hands  . CHF (congestive heart failure) ([place])   . COPD (chronic obstructive pulmonary disease) ([place])   . Depression   . Diabetes mellitus without complication ([place])   . Dyspnea   . Grade II diastolic dysfunction   . History of kidney stones   . Hyperlipidemia   . Hypertension   . Lower back pain   . Panic attacks   . Pneumonia    currently taking antibiotic and prednisone for early stages of pneumonia  . Pulmonary nodules    bilateral  . Skin cancer    face    Current Outpatient Medications:  .  acetaminophen (TYLENOL) 500 MG tablet, Take 500 mg by mouth every 6 (six) hours as needed for mild pain or headache., Disp: , Rfl:  .  aspirin 81 MG tablet, Take 81 mg by mouth daily. Reported on 06/01/2015, Disp: , Rfl:  .  Blood Glucose Calibration (GLUCOMETER DEX HIGH CONTROL) LIQD, Test CBG's once daily. , Disp: , Rfl:  .  escitalopram (LEXAPRO) 20 MG tablet, TAKE 1 TABLET BY MOUTH   DAILY (Patient taking differently: Take 20 mg by mouth daily. ), Disp: 90 tablet, Rfl: 3 .  furosemide (LASIX) 20 MG tablet, TAKE 1 TABLET BY MOUTH  TWICE DAILY (Patient taking differently: Take 20 mg by mouth 2 (two) times daily. ), Disp: 180 tablet, Rfl: 0 .  glucose blood (GLUCOMETER DEX TEST SENSORS) test strip, Use as instructed, Disp: 100 each, Rfl: 1 .  hydrOXYzine (ATARAX/VISTARIL) 10 MG tablet, Take 1 tablet (10 mg total) by mouth 3 (three) times daily as needed for anxiety., Disp: 60 tablet, Rfl: 2 .  ketoconazole (NIZORAL) 2 % cream, , Disp: , Rfl:  .  lisinopril (ZESTRIL) 10 MG tablet, TAKE 1 TABLET BY MOUTH  DAILY (Patient taking differently: Take 10 mg by mouth daily. ), Disp: 90 tablet, Rfl: 0 .  lovastatin (MEVACOR) 20 MG tablet, TAKE 1 TABLET BY MOUTH AT  BEDTIME (Patient taking differently: Take 20 mg by mouth at bedtime. ), Disp: 90 tablet, Rfl: 0 .  metFORMIN (GLUCOPHAGE) 1000 MG tablet, TAKE 1 TABLET BY MOUTH  TWICE DAILY WITH A MEAL. (Patient taking differently: Take 1,000 mg by mouth 2 (two) times daily with a meal. ), Disp: 180 tablet, Rfl: 0 .  oxyCODONE-acetaminophen (PERCOCET) 10-325 MG tablet, Take 1 tablet by mouth every 4 (  four) hours as needed for pain., Disp: 20 tablet, Rfl: 0 Social History   Socioeconomic History  . Marital status: Widowed    Spouse name: Not on file  . Number of children: 2  . Years of education: 3  . Highest education level: Not on file  Occupational History  . Not on file  Tobacco Use  . Smoking status: Former Smoker    Packs/day: 1.50    Years: 40.00    Pack years: 60.00    Types: Cigarettes    Quit date: 04/29/2015    Years since quitting: 3.9  . Smokeless tobacco: Former Systems developer    Quit date: 04/29/2015  . Tobacco comment: Quit smoking 04/2015- Previous 1.5 ppd smoker  Substance and Sexual Activity  . Alcohol use: No    Alcohol/week: 0.0 standard drinks  . Drug use: No  . Sexual activity: Not Currently    Birth control/protection:  Post-menopausal  Other Topics Concern  . Not on file  Social History Narrative  . Not on file   Social Determinants of Health   Financial Resource Strain:   . Difficulty of Paying Living Expenses: Not on file  Food Insecurity:   . Worried About Charity fundraiser in the Last Year: Not on file  . Ran Out of Food in the Last Year: Not on file  Transportation Needs:   . Lack of Transportation (Medical): Not on file  . Lack of Transportation (Non-Medical): Not on file  Physical Activity:   . Days of Exercise per Week: Not on file  . Minutes of Exercise per Session: Not on file  Stress:   . Feeling of Stress : Not on file  Social Connections:   . Frequency of Communication with Friends and Family: Not on file  . Frequency of Social Gatherings with Friends and Family: Not on file  . Attends Religious Services: Not on file  . Active Member of Clubs or Organizations: Not on file  . Attends Archivist Meetings: Not on file  . Marital Status: Not on file  Intimate Partner Violence:   . Fear of Current or Ex-Partner: Not on file  . Emotionally Abused: Not on file  . Physically Abused: Not on file  . Sexually Abused: Not on file   Family History  Problem Relation Age of Onset  . Diabetes Father   . Heart disease Father 59       MI  . Hypertension Father   . COPD Sister   . Cancer Paternal Grandmother 24       Pancreatic    Objective: Office vital signs reviewed. BP 121/70   Pulse (!) 109   Temp 98.9 F (37.2 C) (Temporal)   Ht 5' 2"  (1.575 m)   Wt 298 lb (135.2 kg)   BMI 54.50 kg/m   Physical Examination:  General: Awake, alert, morbidly obese, No acute distress Cardio: regular rate and rhythm, S1S2 heard, no murmurs appreciated Pulm: Air movement fair.  Clear to auscultation bilaterally, no wheezes, rhonchi or rales; normal work of breathing on room air after several minutes of rest.  She was initially dyspneic after ambulation. Extremities: warm, well  perfused, nonpitting edema w/ venous stasis changes noted bilaterally. No cyanosis or clubbing; +1 pulses bilaterally  Ambulatory O2 saturation: Rest: 94% on room air, HR 109 Ambulation: 85%, HR 122 Ambulation w/ 2L O2: 96%  Assessment/ Plan: 69 y.o. female   1. Hypoxia Uncertain etiology at this point.  She is status post treatment  for pneumonia.  However, she does have a risk for COPD given longstanding history of smoking.  I question OHS.  May benefit from repeat sleep study at some point.  She certainly qualifies for oxygen with ambulation and therefore this has been ordered.  No need to use at rest but would certainly use during activity since she desats during this time. - For home use only DME oxygen - Ambulatory referral to Cardiology - Ambulatory referral to Pulmonology  2. Chronic obstructive pulmonary disease, unspecified COPD type ([place]) I reviewed her last several x-rays and CT chest, none of which report emphysematous or COPD changes.  Unsure how she got this diagnosis in her history but I am placing referral to pulmonology given oxygen needs and long standing history of smoking.  Would likely benefit from repeat PFTs.  We also discussed the possibility of needing a sleep study to evaluate for OSA.  - For home use only DME oxygen - Ambulatory referral to Pulmonology  3. Chronic diastolic HF (heart failure) ([place]) I reviewed her October 2016 echocardiogram which demonstrated grade 2 diastolic dysfunction.  Given her new oxygen needs, I am referring her back to cardiology for reevaluation and likely repeat ultrasound.  She is on ACE inhibitor and has Lasix on board as well.  Her lower extremities do demonstrate edema but these appear to be more consistent with venous stasis changes. - For home use only DME oxygen - Compression stockings - Ambulatory referral to Cardiology  4. Bilateral lower extremity edema Compression stockings.  Continue Lasix.  Elevation.  Avoid salt. -  Compression stockings   No orders of the defined types were placed in this encounter.  No orders of the defined types were placed in this encounter.    [name], DO [place] [phone]

## 2019-04-23 ENCOUNTER — Telehealth: Payer: Self-pay | Admitting: Family Medicine

## 2019-04-23 NOTE — Telephone Encounter (Signed)
Pt called requesting to speak with someone who can give her info on who she can see to get a home oxygen tank? Says she has tried [employer] and [employer] and there are issues with both of those places and her insurance. Also wants an update on the Lung Specialist she is supposed to be referred to. Says she hasn't heard anything.

## 2019-04-24 NOTE — Telephone Encounter (Signed)
Her insurance does not work with these suppliers. Community message sent to [place] w/ [employer] Pt aware

## 2019-04-30 ENCOUNTER — Ambulatory Visit: Payer: Medicare Other | Admitting: Cardiology

## 2019-04-30 ENCOUNTER — Encounter: Payer: Self-pay | Admitting: Cardiology

## 2019-04-30 ENCOUNTER — Other Ambulatory Visit: Payer: Self-pay

## 2019-04-30 VITALS — BP 156/79 | HR 80 | Temp 98.3°F | Ht 62.0 in | Wt 303.0 lb

## 2019-04-30 DIAGNOSIS — R011 Cardiac murmur, unspecified: Secondary | ICD-10-CM

## 2019-04-30 DIAGNOSIS — R0602 Shortness of breath: Secondary | ICD-10-CM | POA: Diagnosis not present

## 2019-04-30 DIAGNOSIS — I5033 Acute on chronic diastolic (congestive) heart failure: Secondary | ICD-10-CM | POA: Diagnosis not present

## 2019-04-30 DIAGNOSIS — E1159 Type 2 diabetes mellitus with other circulatory complications: Secondary | ICD-10-CM

## 2019-04-30 DIAGNOSIS — I10 Essential (primary) hypertension: Secondary | ICD-10-CM

## 2019-04-30 DIAGNOSIS — I152 Hypertension secondary to endocrine disorders: Secondary | ICD-10-CM

## 2019-04-30 MED ORDER — FUROSEMIDE 20 MG PO TABS: 40.0000 mg | ORAL_TABLET | Freq: Two times a day (BID) | ORAL | 0 refills | Status: DC

## 2019-04-30 NOTE — Patient Instructions (Signed)
Medication Instructions:  INCREASE LASIX TO 40 MG TWO TIMES DAILY   Labwork: 2 WEEKS   TSH BMET BNP MAGNESIUM  Testing/Procedures: Your physician has requested that you have an echocardiogram. Echocardiography is a painless test that uses sound waves to create images of your heart. It provides your doctor with information about the size and shape of your heart and how well your heart's chambers and valves are working. This procedure takes approximately one hour. There are no restrictions for this procedure.    Follow-Up: Your physician recommends that you schedule a follow-up appointment in: 3 WEEKS    Any Other Special Instructions Will Be Listed Below (If Applicable).     If you need a refill on your cardiac medications before your next appointment, please call your pharmacy.

## 2019-04-30 NOTE — Progress Notes (Addendum)
Clinical Summary Savannah Burns is a 69 y.o.female seen as new consult, referred by Dr Lajuana Ripple for the following medical problems  1. SOB/hypoxia - ongoing workup by pcp. Suspicion of possible COPD, OHS playing a role. - awaitin pulmonary evaluation    2. Chronic diastolic HF - 86/7672 echo LVEF 60-65%, grade II diastolic dysfunction - she is on lasix 69m bid  - long history of LE edema that has worst - has been on lasix for several years - DOE at just a few steps, new over the last 8 months - 12/2017 weight 284. 303 lbs today.  - sleeps in recliner 1 year, more so for back pains.  - no chest pain.    Past Medical History:  Diagnosis Date  . Adrenal adenoma, left    Stable  . Anxiety   . Arthritis    bilateral hands  . [place]est[place]) [place]   . [place]   . Depression   . Diabetes [place] complication [place]   . Dyspnea   . Grade II diastolic dysfunction   . History of kidney stones   . Hyperlipidemia   . Hypertension   . Lower back pain   . Panic attacks   . Pneumonia    currently taking antibiotic and prednisone for early stages of pneumonia  . Pulmonary nodules    bilateral  . Skin cancer    face     Allergies  Allergen Reactions  . Keflex [Cephalexin] Nausea And Vomiting     Current Outpatient Medications  Medication Sig Dispense Refill  . acetaminophen (TYLENOL) 500 MG tablet Take 500 mg by mouth every 6 (six) hours as needed for mild pain or headache.    .Marland Kitchenaspirin 81 MG tablet Take 81 mg by mouth daily. Reported on 06/01/2015    . Blood Glucose Calibration (GLUCOMETER DEX HIGH CONTROL) LIQD Test CBG's once daily.     .Marland Kitchenescitalopram (LEXAPRO) 20 MG tablet TAKE 1 TABLET BY MOUTH  DAILY (Patient taking differently: Take 20 mg by mouth daily. ) 90 tablet 3  . furosemide (LASIX) 20 MG tablet TAKE 1 TABLET BY MOUTH  TWICE DAILY (Patient taking differently: Take 20 mg by mouth 2 (two)  times daily. ) 180 tablet 0  . glucose blood (GLUCOMETER DEX TEST SENSORS) test strip Use as instructed 100 each 1  . hydrOXYzine (ATARAX/VISTARIL) 10 MG tablet Take 1 tablet (10 mg total) by mouth 3 (three) times daily as needed for anxiety. 60 tablet 2  . ketoconazole (NIZORAL) 2 % cream     . lisinopril (ZESTRIL) 10 MG tablet TAKE 1 TABLET BY MOUTH  DAILY (Patient taking differently: Take 10 mg by mouth daily. ) 90 tablet 0  . lovastatin (MEVACOR) 20 MG tablet TAKE 1 TABLET BY MOUTH AT  BEDTIME (Patient taking differently: Take 20 mg by mouth at bedtime. ) 90 tablet 0  . metFORMIN (GLUCOPHAGE) 1000 MG tablet TAKE 1 TABLET BY MOUTH  TWICE DAILY WITH A MEAL. (Patient taking differently: Take 1,000 mg by mouth 2 (two) times daily with a meal. ) 180 tablet 0  . oxyCODONE-acetaminophen (PERCOCET) 10-325 MG tablet Take 1 tablet by mouth every 4 (four) hours as needed for pain. 20 tablet 0   No current facility-administered medications for this visit.     Past Surgical History:  Procedure Laterality Date  . Breast Cystectomy  Right   . CESAREAN SECTION    . CYSTOSCOPY/URETEROSCOPY/HOLMIUM LASER/STENT  PLACEMENT Bilateral 03/01/2019   Procedure: CYSTOSCOPY/RETROGRADEURETEROSCOPY/HOLMIUM LASER/STENT PLACEMENT;  Surgeon: Ceasar Mons, MD;  Location: [facility];  Service: Urology;  Laterality: Bilateral;  ONLY NEEDS 60 MIN  . SKIN CANCER EXCISION     Face  . SPINE SURGERY       Allergies  Allergen Reactions  . Keflex [Cephalexin] Nausea And Vomiting      Family History  Problem Relation Age of Onset  . Diabetes Father   . Heart disease Father 67       MI  . Hypertension Father   . COPD Sister   . Cancer Paternal Grandmother 67       Pancreatic     Social History Savannah Burns reports that she quit smoking about 4 years ago. Her smoking use included cigarettes. She has a 60.00 pack-year smoking history. She quit smokeless tobacco use about 4 years ago. Savannah Burns reports no  history of alcohol use.   Review of Systems CONSTITUTIONAL: No weight loss, fever, chills, weakness or fatigue.  HEENT: Eyes: No visual loss, blurred vision, double vision or yellow sclerae.No hearing loss, sneezing, congestion, runny nose or sore throat.  SKIN: No rash or itching.  CARDIOVASCULAR: per hpi RESPIRATORY: No shortness of breath, cough or sputum.  GASTROINTESTINAL: No anorexia, nausea, vomiting or diarrhea. No abdominal pain or blood.  GENITOURINARY: No burning on urination, no polyuria NEUROLOGICAL: No headache, dizziness, syncope, paralysis, ataxia, numbness or tingling in the extremities. No change in bowel or bladder control.  MUSCULOSKELETAL: No muscle, back pain, joint pain or stiffness.  LYMPHATICS: No enlarged nodes. No history of splenectomy.  PSYCHIATRIC: No history of depression or anxiety.  ENDOCRINOLOGIC: No reports of sweating, cold or heat intolerance. No polyuria or polydipsia.  Marland Kitchen   Physical Examination Today's Vitals   04/30/19 1459 04/30/19 1507  BP: (!) 146/75 (!) 156/79  Pulse: 80   Temp: 98.3 F (36.8 C)   SpO2: 98%   Weight: (!) 303 lb (137.4 kg)   Height: 5' 2"  (1.575 m)    Body mass index is 55.42 kg/m.  Gen: resting comfortably, no acute distress HEENT: no scleral icterus, pupils equal round and reactive, no palptable cervical adenopathy,  CV: RRR, 3/6 systolic murmur rusb, no jvd Resp: Clear to auscultation bilaterally GI: abdomen is soft, non-tender, non-distended, normal bowel sounds, no hepatosplenomegaly MSK: extremities are warm, no edema.  Skin: warm, no rash Neuro:  no focal deficits Psych: appropriate affect    Assessment and Plan  1. Acute on chronic diastolic HF - volume overloaded, certaintly playing some role in her symptoms of SOB, she is awaiting pulmonary evaluation to consider additional causes - increase lasix to 65m bid. In 2 weeks check bmet/mg/tsh/bnp - repeat echo given worsening fluid retention and SOB  since her last study in 2016  2. Heart murmur - obtain echo  3. HTN - bp elevated today, was at goal with visit last month - follow with diuresis   F/u 3 weeks   JArnoldo Lenis M.D

## 2019-04-30 NOTE — Addendum Note (Signed)
Addended by: Debbora Lacrosse R on: 04/30/2019 03:46 PM   Modules accepted: Orders

## 2019-05-03 ENCOUNTER — Telehealth: Payer: Self-pay | Admitting: Family Medicine

## 2019-05-07 ENCOUNTER — Other Ambulatory Visit: Payer: Self-pay

## 2019-05-07 ENCOUNTER — Ambulatory Visit (HOSPITAL_COMMUNITY)
Admission: RE | Admit: 2019-05-07 | Discharge: 2019-05-07 | Disposition: A | Discharge status: Home / Self Care | Payer: Medicare PPO | Source: Ambulatory Visit | Attending: Cardiology | Admitting: Cardiology

## 2019-05-07 DIAGNOSIS — R0602 Shortness of breath: Secondary | ICD-10-CM | POA: Diagnosis not present

## 2019-05-07 NOTE — Progress Notes (Signed)
*  PRELIMINARY RESULTS* Echocardiogram 2D Echocardiogram has been performed.  Savannah Burns 05/07/2019, 4:36 PM

## 2019-05-08 NOTE — Telephone Encounter (Signed)
LM for patient to discuss Ref.

## 2019-05-08 NOTE — Telephone Encounter (Signed)
Thanks for this update.  Please reiterate to Savannah Burns that in the setting of COVID pandemic, I am sure that any pulmonology office is overwhelmed volume wise right now.  While the wait is not ideal, I am sure they are doing the best they can.

## 2019-05-15 ENCOUNTER — Telehealth: Payer: Self-pay

## 2019-05-15 NOTE — Telephone Encounter (Signed)
Called pt. No answer, left message for pt to return call.

## 2019-05-15 NOTE — Telephone Encounter (Signed)
Patient called back and she was given the contact information to [facility] so she could call and schedule her own appt :)

## 2019-05-15 NOTE — Telephone Encounter (Signed)
-----   Message from Arnoldo Lenis, MD sent at 05/14/2019 12:35 PM EST ----- Echo shows some mild stiffness to the heart which is common with aging but can cause some swelling/fluid to build up. One of her heart valves (aortic valve) is mildly thicker than normal but overall working well, this creates her heart murmur and is not considered to be a significant issue. Will discuss in more detail at f/u  Savannah Abts MD

## 2019-05-15 NOTE — Telephone Encounter (Signed)

## 2019-05-21 ENCOUNTER — Ambulatory Visit: Payer: Medicare PPO | Admitting: Student

## 2019-05-29 ENCOUNTER — Encounter: Payer: Medicare PPO | Admitting: Student

## 2019-05-29 ENCOUNTER — Other Ambulatory Visit: Payer: Self-pay

## 2019-05-29 ENCOUNTER — Encounter: Payer: Self-pay | Admitting: Pulmonary Disease

## 2019-05-29 ENCOUNTER — Ambulatory Visit: Payer: Medicare PPO | Admitting: Pulmonary Disease

## 2019-05-29 VITALS — BP 124/80 | HR 83 | Temp 97.6°F | Ht 62.0 in | Wt 303.0 lb

## 2019-05-29 DIAGNOSIS — J449 Chronic obstructive pulmonary disease, unspecified: Secondary | ICD-10-CM

## 2019-05-29 DIAGNOSIS — I110 Hypertensive heart disease with heart failure: Secondary | ICD-10-CM

## 2019-05-29 DIAGNOSIS — R911 Solitary pulmonary nodule: Secondary | ICD-10-CM

## 2019-05-29 DIAGNOSIS — I5030 Unspecified diastolic (congestive) heart failure: Secondary | ICD-10-CM

## 2019-05-29 LAB — CBC WITH DIFFERENTIAL/PLATELET
Basophils Absolute: 0 10*3/uL (ref 0.0–0.1)
Basophils Relative: 0.4 % (ref 0.0–3.0)
Eosinophils Absolute: 0.1 10*3/uL (ref 0.0–0.7)
Eosinophils Relative: 1.3 % (ref 0.0–5.0)
HCT: 36.1 % (ref 36.0–46.0)
Hemoglobin: 11.9 g/dL — ABNORMAL LOW (ref 12.0–15.0)
Lymphocytes Relative: 30.8 % (ref 12.0–46.0)
Lymphs Abs: 2.1 10*3/uL (ref 0.7–4.0)
MCHC: 33.1 g/dL (ref 30.0–36.0)
MCV: 93 fl (ref 78.0–100.0)
Monocytes Absolute: 0.6 10*3/uL (ref 0.1–1.0)
Monocytes Relative: 9.3 % (ref 3.0–12.0)
Neutro Abs: 4 10*3/uL (ref 1.4–7.7)
Neutrophils Relative %: 58.2 % (ref 43.0–77.0)
Platelets: 117 10*3/uL — ABNORMAL LOW (ref 150.0–400.0)
RBC: 3.88 Mil/uL (ref 3.87–5.11)
RDW: 14.9 % (ref 11.5–15.5)
WBC: 6.9 10*3/uL (ref 4.0–10.5)

## 2019-05-29 LAB — BRAIN NATRIURETIC PEPTIDE: Pro B Natriuretic peptide (BNP): 58 pg/mL (ref 0.0–100.0)

## 2019-05-29 MED ORDER — TRELEGY ELLIPTA 200-62.5-25 MCG/INH IN AEPB: 1.0000 | INHALATION_SPRAY | Freq: Every day | RESPIRATORY_TRACT | 3 refills | Status: DC

## 2019-05-29 MED ORDER — TRELEGY ELLIPTA 200-62.5-25 MCG/INH IN AEPB: 1.0000 | INHALATION_SPRAY | Freq: Every day | RESPIRATORY_TRACT | 0 refills | Status: DC

## 2019-05-29 NOTE — Patient Instructions (Signed)
We will check some labs today including CBC differential, IgE, BNP Schedule PFTs, CT chest without contrast for follow-up of lung nodules  We will start you on an inhaler called Trelegy Follow-up in 4 weeks.

## 2019-05-29 NOTE — Progress Notes (Signed)
Savannah Burns    [id number]    01-25-1951  Primary Care Physician:Gottschalk, Koleen Distance, DO  Referring Physician: [name], DO [place],  [place] [address]  Chief complaint: Evaluation for dyspnea  HPI: 69 year old with asthma, hypertension, allergies, COPD, diastolic heart failure, recurrent pneumonia Complains of progressive dyspnea for the past 6 months with worsening over the last 2 months Symptoms with exertion and at rest.  Has occasional cough with mucus production.  No wheezing Seen by cardiology in early February with increasing Lasix to 40 mg twice daily.  States that her lower extremity edema has improved with this but continues to have dyspnea.  Started on supplemental oxygen by her primary care.   Pets: Has dogs, cats, no birds Occupation: Retired Optometrist Exposures: No known exposures.  No mold, hot tub, Jacuzzi Smoking history: 60-pack-year smoker.  Quit in 2017 Travel history: Originally from [place].  No significant recent travel Relevant family history: Still had COPD.  She was a smoker.   Outpatient Encounter Medications as of 05/29/2019  Medication Sig  . aspirin 81 MG tablet Take 81 mg by mouth daily. Reported on 06/01/2015  . Blood Glucose Calibration (GLUCOMETER DEX HIGH CONTROL) LIQD Test CBG's once daily.   Marland Kitchen escitalopram (LEXAPRO) 20 MG tablet TAKE 1 TABLET BY MOUTH  DAILY (Patient taking differently: Take 20 mg by mouth daily. )  . furosemide (LASIX) 20 MG tablet Take 2 tablets (40 mg total) by mouth 2 (two) times daily.  Marland Kitchen glucose blood (GLUCOMETER DEX TEST SENSORS) test strip Use as instructed  . hydrOXYzine (ATARAX/VISTARIL) 10 MG tablet Take 1 tablet (10 mg total) by mouth 3 (three) times daily as needed for anxiety.  Marland Kitchen ketoconazole (NIZORAL) 2 % cream   . lisinopril (ZESTRIL) 10 MG tablet TAKE 1 TABLET BY MOUTH  DAILY (Patient taking differently: Take 10 mg by mouth daily. )  . lovastatin (MEVACOR) 20 MG  tablet TAKE 1 TABLET BY MOUTH AT  BEDTIME (Patient taking differently: Take 20 mg by mouth at bedtime. )  . metFORMIN (GLUCOPHAGE) 1000 MG tablet TAKE 1 TABLET BY MOUTH  TWICE DAILY WITH A MEAL. (Patient taking differently: Take 1,000 mg by mouth 2 (two) times daily with a meal. )  . oxyCODONE-acetaminophen (PERCOCET) 10-325 MG tablet Take 1 tablet by mouth every 4 (four) hours as needed for pain.  . [DISCONTINUED] albuterol (PROVENTIL HFA;VENTOLIN HFA) 108 (90 Base) MCG/ACT inhaler Inhale 2 puffs into the lungs every 6 (six) hours as needed for wheezing or shortness of breath.   No facility-administered encounter medications on file as of 05/29/2019.    Allergies as of 05/29/2019 - Review Complete 05/29/2019  Allergen Reaction Noted  . Keflex [cephalexin] Nausea And Vomiting 04/17/2019    Past Medical History:  Diagnosis Date  . Adrenal adenoma, left    Stable  . Anxiety   . Arthritis    bilateral hands  . CHF (congestive heart failure) ([place])   . COPD (chronic obstructive pulmonary disease) ([place])   . Depression   . Diabetes mellitus without complication ([place])   . Dyspnea   . Grade II diastolic dysfunction   . History of kidney stones   . Hyperlipidemia   . Hypertension   . Lower back pain   . Panic attacks   . Pneumonia    currently taking antibiotic and prednisone for early stages of pneumonia  . Pulmonary nodules    bilateral  .  Skin cancer    face    Past Surgical History:  Procedure Laterality Date  . Breast Cystectomy  Right   . CESAREAN SECTION    . CYSTOSCOPY/URETEROSCOPY/HOLMIUM LASER/STENT PLACEMENT Bilateral 03/01/2019   Procedure: CYSTOSCOPY/RETROGRADEURETEROSCOPY/HOLMIUM LASER/STENT PLACEMENT;  Surgeon: Ceasar Mons, MD;  Location: [facility];  Service: Urology;  Laterality: Bilateral;  ONLY NEEDS 60 MIN  . SKIN CANCER EXCISION     Face  . SPINE SURGERY      Family History  Problem Relation Age of Onset  . Diabetes Father   . Heart disease  Father 10       MI  . Hypertension Father   . COPD Sister   . Cancer Paternal Grandmother 13       Pancreatic    Social History   Socioeconomic History  . Marital status: Widowed    Spouse name: Not on file  . Number of children: 2  . Years of education: 37  . Highest education level: Not on file  Occupational History  . Not on file  Tobacco Use  . Smoking status: Former Smoker    Packs/day: 1.50    Years: 40.00    Pack years: 60.00    Types: Cigarettes    Quit date: 04/29/2015    Years since quitting: 4.0  . Smokeless tobacco: Former Systems developer    Quit date: 04/29/2015  . Tobacco comment: Quit smoking 04/2015- Previous 1.5 ppd smoker  Substance and Sexual Activity  . Alcohol use: No    Alcohol/week: 0.0 standard drinks  . Drug use: No  . Sexual activity: Not Currently    Birth control/protection: Post-menopausal  Other Topics Concern  . Not on file  Social History Narrative  . Not on file   Social Determinants of Health   Financial Resource Strain:   . Difficulty of Paying Living Expenses: Not on file  Food Insecurity:   . Worried About Charity fundraiser in the Last Year: Not on file  . Ran Out of Food in the Last Year: Not on file  Transportation Needs:   . Lack of Transportation (Medical): Not on file  . Lack of Transportation (Non-Medical): Not on file  Physical Activity:   . Days of Exercise per Week: Not on file  . Minutes of Exercise per Session: Not on file  Stress:   . Feeling of Stress : Not on file  Social Connections:   . Frequency of Communication with Friends and Family: Not on file  . Frequency of Social Gatherings with Friends and Family: Not on file  . Attends Religious Services: Not on file  . Active Member of Clubs or Organizations: Not on file  . Attends Archivist Meetings: Not on file  . Marital Status: Not on file  Intimate Partner Violence:   . Fear of Current or Ex-Partner: Not on file  . Emotionally Abused: Not on file  .  Physically Abused: Not on file  . Sexually Abused: Not on file    Review of systems: Review of Systems  Constitutional: Negative for fever and chills.  HENT: Negative.   Eyes: Negative for blurred vision.  Respiratory: as per HPI  Cardiovascular: Negative for chest pain and palpitations.  Gastrointestinal: Negative for vomiting, diarrhea, blood per rectum. Genitourinary: Negative for dysuria, urgency, frequency and hematuria.  Musculoskeletal: Negative for myalgias, back pain and joint pain.  Skin: Negative for itching and rash.  Neurological: Negative for dizziness, tremors, focal weakness, seizures and loss of consciousness.  Endo/Heme/Allergies: Negative for environmental allergies.  Psychiatric/Behavioral: Negative for depression, suicidal ideas and hallucinations.  All other systems reviewed and are negative.  Physical Exam: Blood pressure 124/80, pulse 83, temperature 97.6 F (36.4 C), temperature source Temporal, height 5' 2"  (1.575 m), weight (!) 303 lb (137.4 kg), SpO2 98 %. Gen:      No acute distress HEENT:  EOMI, sclera anicteric Neck:     No masses; no thyromegaly Lungs:    Clear to auscultation bilaterally; normal respiratory effort CV:         Regular rate and rhythm; no murmurs Abd:      + bowel sounds; soft, non-tender; no palpable masses, no distension Ext:    No edema; adequate peripheral perfusion Skin:      Warm and dry; no rash Neuro: alert and oriented x 3 Psych: normal mood and affect  Data Reviewed: Imaging: CT chest 12/1/9-subcentimeter pulmonary nodules.  Left upper lobe cavitary nodule. CT chest 09/22/2008-stable pulmonary nodules  CT chest 08/12/2009-stable pulmonary nodules CT chest 07/03/2014-stable pulmonary nodules, stable left adrenal adenoma Chest x-ray 02/28/2019-no acute findings. I reviewed images personally.  PFTs: Spirometry 08/21/2015 FVC 2.32 [86%], FEV1 1.86 [6%], F/F 80. No obstruction  Cardiac: Echocardiogram 03/05/2020- LVEF  70-1 5%, mild LVH, grade 1 diastolic dysfunction with elevated left ventricular pressure.  Normal RV systolic function and size.  Sleep Split-night sleep study 12/17/2015 No significant OSA, AHI 1.5.  Mild O2 desats to 88%  Assessment:  Chronic dyspnea, hypoxic respiratory failure Has significant smoking history but prior spirometry's does not show significant obstruction We will repeat pulmonary function test Get baseline labs today including CBC differential, IgE, BNP and alpha-1 antitrypsin levels and phenotype Trial Trelegy inhaler  Pulmonary nodules Reviewed CT scans up to 2016 which are stable pulmonary nodules, likely due to prior granulomatous infection Schedule CT chest without contrast to reevaluate.  This will also help [place] take a look at the lung parenchyma for any interstitial process.  Plan/Recommendations: CBC, IgE, BNP, alpha-1 antitrypsin PFTs, CT chest Trelegy inhaler  Marshell Garfinkel MD [facility] 05/29/2019, 10:30 AM  CC: [name], DO

## 2019-05-30 ENCOUNTER — Encounter: Payer: Self-pay | Admitting: Student

## 2019-05-30 ENCOUNTER — Telehealth (INDEPENDENT_AMBULATORY_CARE_PROVIDER_SITE_OTHER): Payer: Medicare PPO | Admitting: Student

## 2019-05-30 VITALS — BP 124/80 | HR 83 | Ht 62.0 in | Wt 300.0 lb

## 2019-05-30 DIAGNOSIS — I5032 Chronic diastolic (congestive) heart failure: Secondary | ICD-10-CM

## 2019-05-30 DIAGNOSIS — I10 Essential (primary) hypertension: Secondary | ICD-10-CM

## 2019-05-30 DIAGNOSIS — E782 Mixed hyperlipidemia: Secondary | ICD-10-CM

## 2019-05-30 DIAGNOSIS — I350 Nonrheumatic aortic (valve) stenosis: Secondary | ICD-10-CM

## 2019-05-30 DIAGNOSIS — J9611 Chronic respiratory failure with hypoxia: Secondary | ICD-10-CM

## 2019-05-30 DIAGNOSIS — Z79899 Other long term (current) drug therapy: Secondary | ICD-10-CM

## 2019-05-30 NOTE — Patient Instructions (Signed)
Medication Instructions:  Your physician recommends that you continue on your current medications as directed. Please refer to the Current Medication list given to you today.  *If you need a refill on your cardiac medications before your next appointment, please call your pharmacy*   Lab Work: Your physician recommends that you return for lab work in: at next blood draw   If you have labs (blood work) drawn today and your tests are completely normal, you will receive your results only by: Marland Kitchen MyChart Message (if you have MyChart) OR . A paper copy in the mail If you have any lab test that is abnormal or we need to change your treatment, we will call you to review the results.   Testing/Procedures: NONE    Follow-Up: At [facility], you and your health needs are our priority.  As part of our continuing mission to provide you with exceptional heart care, we have created designated Provider Care Teams.  These Care Teams include your primary Cardiologist (physician) and Advanced Practice Providers (APPs -  Physician Assistants and Nurse Practitioners) who all work together to provide you with the care you need, when you need it.  We recommend signing up for the patient portal called "MyChart".  Sign up information is provided on this After Visit Summary.  MyChart is used to connect with patients for Virtual Visits (Telemedicine).  Patients are able to view lab/test results, encounter notes, upcoming appointments, etc.  Non-urgent messages can be sent to your provider as well.   To learn more about what you can do with MyChart, go to [email or web address].    Your next appointment:   3 month(s)  The format for your next appointment:   In Person  Provider:   Carlyle Dolly, MD   Other Instructions Thank you for choosing [facility]!

## 2019-05-30 NOTE — Progress Notes (Signed)
Virtual Visit via Telephone Note   This visit type was conducted due to national recommendations for restrictions regarding the COVID-19 Pandemic (e.g. social distancing) in an effort to limit this patient's exposure and mitigate transmission in our community.  Due to her co-morbid illnesses, this patient is at least at moderate risk for complications without adequate follow up.  This format is felt to be most appropriate for this patient at this time.  The patient did not have access to video technology/had technical difficulties with video requiring transitioning to audio format only (telephone).  All issues noted in this document were discussed and addressed.  No physical exam could be performed with this format.  Please refer to the patient's chart for her  consent to telehealth for [facility].   Date:  05/30/2019   ID:  Savannah Burns, DOB 1950/11/20, MRN [id number]  Patient Location: Home Provider Location: Office  PCP:  [name], DO  Cardiologist:  Carlyle Dolly, MD  Electrophysiologist:  None   Evaluation Performed:  Follow-Up Visit  Chief Complaint: 19-monthvisit  History of Present Illness:    VTexasis a 69y.o. female with past medical history of chronic diastolic CHF, COPD, HTN, HLD, and Type 2 DM who presents for a 124-monthollow-up telehealth visit.   She was last examined by Dr. BrHarl Bowien 04/30/2019 as a new patient referral. Reported worsening dyspnea on exertion and had recently been started on supplemental oxygen by her PCP and referred to Pulmonology as well. She was taking Lasix 20 mg twice daily at the time of her visit and weight was at 303 lbs, therefore it was recommended she increase Lasix to 4068mID and have a repeat echocardiogram given her worsening fluid retention and murmur on examination. Was also to have repeat labs including BMET, Mg, TSH and BNP but this have not yet been obtained.   Echocardiogram did show a preserved EF  of 70-75%, no regional wall motion abnormalities, mildly increased LVH, Grade 1 DD, trivial MR and mild AS.   In talking with the patent today she reports still having dyspnea on exertion. Was evaluated by Pulmonology yesterday and started on Trelegy along with a Chest CT and PFT's being ordered. She feel like the inhaler makes her feel jittery but has only used this twice thus far.   She denies any associated chest pain or palpitations. Her lower extremity edema has improved with dose titration of Lasix. She sleeps with 3 pillows at night and has done so for several years. Her granddaughter does most of the cooking and they do not add additional salt to the food. She does report her weight has increased over the past several years as she suffered a back injury 14+ years ago and has not been able to be as active since. She is considering joining [employer] The patient does not have symptoms concerning for COVID-19 infection (fever, chills, cough, or new shortness of breath).    Past Medical History:  Diagnosis Date  . Adrenal adenoma, left    Stable  . Anxiety   . Arthritis    bilateral hands  . CHF ([place]ive heart failure) ([place] . COPD (chronic obstructive pulmonary disease) ([place] . Depression   . Diabetes mellitus without complication ([place] . Dyspnea   . Grade II diastolic dysfunction   . History of kidney stones   . Hyperlipidemia   . Hypertension   . Lower back  pain   . Panic attacks   . Pneumonia    currently taking antibiotic and prednisone for early stages of pneumonia  . Pulmonary nodules    bilateral  . Skin cancer    face   Past Surgical History:  Procedure Laterality Date  . Breast Cystectomy  Right   . CESAREAN SECTION    . CYSTOSCOPY/URETEROSCOPY/HOLMIUM LASER/STENT PLACEMENT Bilateral 03/01/2019   Procedure: CYSTOSCOPY/RETROGRADEURETEROSCOPY/HOLMIUM LASER/STENT PLACEMENT;  Surgeon: Ceasar Mons, MD;  Location: [facility];  Service: Urology;   Laterality: Bilateral;  ONLY NEEDS 60 MIN  . SKIN CANCER EXCISION     Face  . SPINE SURGERY       Current Meds  Medication Sig  . aspirin 81 MG tablet Take 81 mg by mouth daily. Reported on 06/01/2015  . Blood Glucose Calibration (GLUCOMETER DEX HIGH CONTROL) LIQD Test CBG's once daily.   Marland Kitchen escitalopram (LEXAPRO) 20 MG tablet TAKE 1 TABLET BY MOUTH  DAILY (Patient taking differently: Take 20 mg by mouth daily. )  . Fluticasone-Umeclidin-Vilant (TRELEGY ELLIPTA) 200-62.5-25 MCG/INH AEPB Inhale 1 puff into the lungs daily.  . Fluticasone-Umeclidin-Vilant (TRELEGY ELLIPTA) 200-62.5-25 MCG/INH AEPB Inhale 1 puff into the lungs daily.  . furosemide (LASIX) 20 MG tablet Take 2 tablets (40 mg total) by mouth 2 (two) times daily.  Marland Kitchen glucose blood (GLUCOMETER DEX TEST SENSORS) test strip Use as instructed  . hydrOXYzine (ATARAX/VISTARIL) 10 MG tablet Take 1 tablet (10 mg total) by mouth 3 (three) times daily as needed for anxiety.  Marland Kitchen ketoconazole (NIZORAL) 2 % cream   . lisinopril (ZESTRIL) 10 MG tablet TAKE 1 TABLET BY MOUTH  DAILY (Patient taking differently: Take 10 mg by mouth daily. )  . lovastatin (MEVACOR) 20 MG tablet TAKE 1 TABLET BY MOUTH AT  BEDTIME (Patient taking differently: Take 20 mg by mouth at bedtime. )  . metFORMIN (GLUCOPHAGE) 1000 MG tablet TAKE 1 TABLET BY MOUTH  TWICE DAILY WITH A MEAL. (Patient taking differently: Take 1,000 mg by mouth 2 (two) times daily with a meal. )  . oxyCODONE-acetaminophen (PERCOCET) 10-325 MG tablet Take 1 tablet by mouth every 6 (six) hours as needed for pain.     Allergies:   Keflex [cephalexin]   Social History   Tobacco Use  . Smoking status: Former Smoker    Packs/day: 1.50    Years: 40.00    Pack years: 60.00    Types: Cigarettes    Quit date: 04/29/2015    Years since quitting: 4.0  . Smokeless tobacco: Former Systems developer    Quit date: 04/29/2015  . Tobacco comment: Quit smoking 04/2015- Previous 1.5 ppd smoker  Substance Use Topics  .  Alcohol use: No    Alcohol/week: 0.0 standard drinks  . Drug use: No     Family Hx: The patient's family history includes COPD in her sister; Cancer (age of onset: 91) in her paternal grandmother; Diabetes in her father; Heart disease (age of onset: 33) in her father; Hypertension in her father.  ROS:   Please see the history of present illness.     All other systems reviewed and are negative.   Prior CV studies:   The following studies were reviewed today:  Echocardiogram: 04/2019 IMPRESSIONS    1. Left ventricular ejection fraction, by estimation, is 70 to 75%. The  left ventricle has hyperdynamic function. The left ventrical has no  regional wall motion abnormalities. There is mildly increased left  ventricular hypertrophy. Left ventricular  diastolic parameters are consistent  with Grade I diastolic dysfunction  (impaired relaxation). Elevated left ventricular pressure.  2. Right ventricular systolic function is normal. The right ventricular  size is normal.  3. Left atrial size was mildly dilated.  4. Trivial mitral valve regurgitation.  5. The aortic valve has an indeterminant number of cusps. Aortic valve  regurgitation is not visualized. Mild aortic valve stenosis.  6. The inferior vena cava is normal in size with greater than 50%  respiratory variability, suggesting right atrial pressure of 3 mmHg.   Labs/Other Tests and Data Reviewed:    EKG:  No ECG reviewed.  Recent Labs: 02/28/2019: BUN 15; Creatinine, Ser 0.79; Potassium 4.9; Sodium 138 05/29/2019: Hemoglobin 11.9; Platelets 117.0; Pro B Natriuretic peptide (BNP) 58.0   Recent Lipid Panel Lab Results  Component Value Date/Time   CHOL 167 06/27/2014 09:15 AM   TRIG 125 06/27/2014 09:15 AM   HDL 34 (L) 06/27/2014 09:15 AM   CHOLHDL 4.9 06/27/2014 09:15 AM   LDLCALC 108 (H) 06/27/2014 09:15 AM    Wt Readings from Last 3 Encounters:  05/30/19 300 lb (136.1 kg)  05/29/19 (!) 303 lb (137.4 kg)   04/30/19 (!) 303 lb (137.4 kg)     Objective:    Vital Signs:  BP 124/80   Pulse 83   Ht 5' 2"  (1.575 m)   Wt 300 lb (136.1 kg)   BMI 54.87 kg/m    General: Pleasant female sounding in NAD Psych: Normal affect. Neuro: Alert and oriented X 3. Lungs:  Resp regular and unlabored while talking on the phone.   ASSESSMENT & PLAN:    1. Chronic Diastolic CHF - she reports improvement in her edema with dose titration of Lasix at her last office visit. Recent echo showed a preserved EF with no regional WMA but she did have LVH as outlined above and this was reviewed with the patient today. She still has dyspnea on exertion which is also being followed by Pulmonology with Chest CT and PFT's planned.  - BNP was obtained by Pulmonology yesterday and WNL at 58. Given improvement in her edema, will continue Lasix at 35m BID for now. She did not have a BMET at the time of her visit yesterday and we will mail her a lab slip so this can be obtained. Sodium and fluid restriction reviewed.   2. Chronic Hypoxic Respiratory Failure - being followed by Pulmonology and she remains on oxygen supplementation at this time.   3. Aortic Stenosis - recent echocardiogram showed mild AS. Will continue to follow over time.   4. HTN - BP was well-controlled at 124/80 at her most recent office visit. She does not have a BP cuff at home so I have sent a message to Social Work to see if one can be sent to the patient so she can follow this more closely.  - continue current regimen for now with Lisinopril 147mdaily.   5. HLD -  Followed by PCP. She remains on Lovastatin 2065maily.    COVID-19 Education: The signs and symptoms of COVID-19 were discussed with the patient and how to seek care for testing (follow up with PCP or arrange E-visit).  The importance of social distancing was discussed today.  Time:   Today, I have spent 17 minutes with the patient with telehealth technology discussing the above  problems.     Medication Adjustments/Labs and Tests Ordered: Current medicines are reviewed at length with the patient today.  Concerns regarding medicines are outlined above.  Tests Ordered: Orders Placed This Encounter  Procedures  . Basic Metabolic Panel (BMET)    Medication Changes: No orders of the defined types were placed in this encounter.   Follow Up:  In Person in 3 month(s)  Signed, Erma Heritage, PA-C  05/30/2019 7:32 PM    [facility]

## 2019-05-30 NOTE — Progress Notes (Signed)
This encounter was created in error - please disregard.

## 2019-05-31 ENCOUNTER — Telehealth (HOSPITAL_COMMUNITY): Payer: Self-pay | Admitting: Licensed Clinical Social Worker

## 2019-05-31 NOTE — Telephone Encounter (Signed)
CSW received consult to help pt to get BP cuff.  CSW ordered- anticipated arrival tomorrow  Jorge Ny, [place] Worker [facility] Desk#: [phone] Cell#: [phone]

## 2019-06-06 DIAGNOSIS — Z79899 Other long term (current) drug therapy: Secondary | ICD-10-CM | POA: Diagnosis not present

## 2019-06-06 DIAGNOSIS — M5136 Other intervertebral disc degeneration, lumbar region: Secondary | ICD-10-CM | POA: Diagnosis not present

## 2019-06-06 DIAGNOSIS — G894 Chronic pain syndrome: Secondary | ICD-10-CM | POA: Diagnosis not present

## 2019-06-08 LAB — ALPHA-1 ANTITRYPSIN PHENOTYPE: A-1 Antitrypsin, Ser: 177 mg/dL (ref 83–199)

## 2019-06-08 LAB — IGE: IgE (Immunoglobulin E), Serum: 2325 kU/L — ABNORMAL HIGH (ref <=114)

## 2019-06-10 ENCOUNTER — Ambulatory Visit
Admission: RE | Admit: 2019-06-10 | Discharge: 2019-06-10 | Disposition: A | Discharge status: Home / Self Care | Payer: Medicare PPO | Source: Ambulatory Visit | Attending: Pulmonary Disease | Admitting: Pulmonary Disease

## 2019-06-10 ENCOUNTER — Other Ambulatory Visit: Payer: Self-pay

## 2019-06-10 DIAGNOSIS — R918 Other nonspecific abnormal finding of lung field: Secondary | ICD-10-CM | POA: Diagnosis not present

## 2019-06-10 DIAGNOSIS — R911 Solitary pulmonary nodule: Secondary | ICD-10-CM

## 2019-06-11 DIAGNOSIS — Z03818 Encounter for observation for suspected exposure to other biological agents ruled out: Secondary | ICD-10-CM | POA: Diagnosis not present

## 2019-06-11 DIAGNOSIS — Z20828 Contact with and (suspected) exposure to other viral communicable diseases: Secondary | ICD-10-CM | POA: Diagnosis not present

## 2019-06-13 ENCOUNTER — Telehealth: Payer: Self-pay | Admitting: *Deleted

## 2019-06-19 ENCOUNTER — Ambulatory Visit: Payer: Medicare PPO | Admitting: Family Medicine

## 2019-06-19 NOTE — Progress Notes (Deleted)
Subjective: CC: DM PCP: [name], DO TOI:[id number] Savannah Burns is a 69 y.o. female presenting to clinic today for:  1. Type 2 Diabetes w/ HTN, HLD:  Patient reports: Glucometer:***, High at home: ***; Low at home: ***, Taking medication(s): ***,.  Last eye exam: *** Last foot exam: *** Last A1c:  Lab Results  Component Value Date   HGBA1C 7.2 (H) 02/28/2019   Nephropathy screen indicated?: *** Last flu, zoster and/or pneumovax:  Immunization History  Administered Date(s) Administered  . Hepatitis B 05/30/1991, 06/27/1991, 12/04/1991  . Influenza Whole 01/04/2008, 03/24/2009  . Influenza, High Dose Seasonal PF 01/17/2018, 12/29/2018  . Influenza,inj,Quad PF,6+ Mos 11/27/2014  . Influenza-Unspecified 01/26/2013, 03/01/2014  . Pneumococcal Conjugate-13 03/01/2014  . Pneumococcal Polysaccharide-23 09/08/2015  . Td 05/27/2002  . Zoster 02/14/2013    ROS: ***dizziness, LOC, polyuria, polydipsia, unintended weight loss/gain, foot ulcerations, numbness or tingling in extremities, shortness of breath or chest pain.    ROS: Per HPI  Allergies  Allergen Reactions  . Keflex [Cephalexin] Nausea And Vomiting   Past Medical History:  Diagnosis Date  . Adrenal adenoma, left    Stable  . Anxiety   . Arthritis    bilateral hands  . CHF (congestive heart failure) ([place])   . COPD (chronic obstructive pulmonary disease) ([place])   . Depression   . Diabetes mellitus without complication ([place])   . Dyspnea   . Grade II diastolic dysfunction   . History of kidney stones   . Hyperlipidemia   . Hypertension   . Lower back pain   . Panic attacks   . Pneumonia    currently taking antibiotic and prednisone for early stages of pneumonia  . Pulmonary nodules    bilateral  . Skin cancer    face    Current Outpatient Medications:  .  aspirin 81 MG tablet, Take 81 mg by mouth daily. Reported on 06/01/2015, Disp: , Rfl:  .  Blood Glucose Calibration (GLUCOMETER DEX HIGH CONTROL)  LIQD, Test CBG's once daily. , Disp: , Rfl:  .  escitalopram (LEXAPRO) 20 MG tablet, TAKE 1 TABLET BY MOUTH  DAILY (Patient taking differently: Take 20 mg by mouth daily. ), Disp: 90 tablet, Rfl: 3 .  Fluticasone-Umeclidin-Vilant (TRELEGY ELLIPTA) 200-62.5-25 MCG/INH AEPB, Inhale 1 puff into the lungs daily., Disp: 14 each, Rfl: 0 .  Fluticasone-Umeclidin-Vilant (TRELEGY ELLIPTA) 200-62.5-25 MCG/INH AEPB, Inhale 1 puff into the lungs daily., Disp: 60 each, Rfl: 3 .  furosemide (LASIX) 20 MG tablet, Take 2 tablets (40 mg total) by mouth 2 (two) times daily., Disp: 360 tablet, Rfl: 0 .  glucose blood (GLUCOMETER DEX TEST SENSORS) test strip, Use as instructed, Disp: 100 each, Rfl: 1 .  hydrOXYzine (ATARAX/VISTARIL) 10 MG tablet, Take 1 tablet (10 mg total) by mouth 3 (three) times daily as needed for anxiety., Disp: 60 tablet, Rfl: 2 .  ketoconazole (NIZORAL) 2 % cream, , Disp: , Rfl:  .  lisinopril (ZESTRIL) 10 MG tablet, TAKE 1 TABLET BY MOUTH  DAILY (Patient taking differently: Take 10 mg by mouth daily. ), Disp: 90 tablet, Rfl: 0 .  lovastatin (MEVACOR) 20 MG tablet, TAKE 1 TABLET BY MOUTH AT  BEDTIME (Patient taking differently: Take 20 mg by mouth at bedtime. ), Disp: 90 tablet, Rfl: 0 .  metFORMIN (GLUCOPHAGE) 1000 MG tablet, TAKE 1 TABLET BY MOUTH  TWICE DAILY WITH A MEAL. (Patient taking differently: Take 1,000 mg by mouth 2 (two) times daily with a meal. ), Disp: 180  tablet, Rfl: 0 .  oxyCODONE-acetaminophen (PERCOCET) 10-325 MG tablet, Take 1 tablet by mouth every 6 (six) hours as needed for pain., Disp: , Rfl:  Social History   Socioeconomic History  . Marital status: Widowed    Spouse name: Not on file  . Number of children: 2  . Years of education: 74  . Highest education level: Not on file  Occupational History  . Not on file  Tobacco Use  . Smoking status: Former Smoker    Packs/day: 1.50    Years: 40.00    Pack years: 60.00    Types: Cigarettes    Quit date: 04/29/2015     Years since quitting: 4.1  . Smokeless tobacco: Former Systems developer    Quit date: 04/29/2015  . Tobacco comment: Quit smoking 04/2015- Previous 1.5 ppd smoker  Substance and Sexual Activity  . Alcohol use: No    Alcohol/week: 0.0 standard drinks  . Drug use: No  . Sexual activity: Not Currently    Birth control/protection: Post-menopausal  Other Topics Concern  . Not on file  Social History Narrative  . Not on file   Social Determinants of Health   Financial Resource Strain:   . Difficulty of Paying Living Expenses:   Food Insecurity:   . Worried About Charity fundraiser in the Last Year:   . Arboriculturist in the Last Year:   Transportation Needs:   . Film/video editor (Medical):   Marland Kitchen Lack of Transportation (Non-Medical):   Physical Activity:   . Days of Exercise per Week:   . Minutes of Exercise per Session:   Stress:   . Feeling of Stress :   Social Connections:   . Frequency of Communication with Friends and Family:   . Frequency of Social Gatherings with Friends and Family:   . Attends Religious Services:   . Active Member of Clubs or Organizations:   . Attends Archivist Meetings:   Marland Kitchen Marital Status:   Intimate Partner Violence:   . Fear of Current or Ex-Partner:   . Emotionally Abused:   Marland Kitchen Physically Abused:   . Sexually Abused:    Family History  Problem Relation Age of Onset  . Diabetes Father   . Heart disease Father 1       MI  . Hypertension Father   . COPD Sister   . Cancer Paternal Grandmother 41       Pancreatic    Objective: Office vital signs reviewed. There were no vitals taken for this visit.  Physical Examination:  General: Awake, alert, *** nourished, No acute distress HEENT: Normal    Neck: No masses palpated. No lymphadenopathy    Ears: Tympanic membranes intact, normal light reflex, no erythema, no bulging    Eyes: PERRLA, extraocular membranes intact, sclera ***    Nose: nasal turbinates moist, *** nasal discharge     Throat: moist mucus membranes, no erythema, *** tonsillar exudate.  Airway is patent Cardio: regular rate and rhythm, S1S2 heard, no murmurs appreciated Pulm: clear to auscultation bilaterally, no wheezes, rhonchi or rales; normal work of breathing on room air GI: soft, non-tender, non-distended, bowel sounds present x4, no hepatomegaly, no splenomegaly, no masses GU: external vaginal tissue ***, cervix ***, *** punctate lesions on cervix appreciated, *** discharge from cervical os, *** bleeding, *** cervical motion tenderness, *** abdominal/ adnexal masses Extremities: warm, well perfused, No edema, cyanosis or clubbing; +*** pulses bilaterally MSK: *** gait and *** station Skin: dry;  intact; no rashes or lesions Neuro: *** Strength and light touch sensation grossly intact, *** DTRs ***/4  Assessment/ Plan: 69 y.o. female   ***  No orders of the defined types were placed in this encounter.  No orders of the defined types were placed in this encounter.    [name], DO [place] [phone]

## 2019-06-21 ENCOUNTER — Encounter: Payer: Self-pay | Admitting: Family Medicine

## 2019-06-24 DIAGNOSIS — J449 Chronic obstructive pulmonary disease, unspecified: Secondary | ICD-10-CM | POA: Diagnosis not present

## 2019-07-08 DIAGNOSIS — M545 Low back pain: Secondary | ICD-10-CM | POA: Diagnosis not present

## 2019-07-08 DIAGNOSIS — G8929 Other chronic pain: Secondary | ICD-10-CM | POA: Diagnosis not present

## 2019-07-08 DIAGNOSIS — Z79899 Other long term (current) drug therapy: Secondary | ICD-10-CM | POA: Diagnosis not present

## 2019-07-08 DIAGNOSIS — G894 Chronic pain syndrome: Secondary | ICD-10-CM | POA: Diagnosis not present

## 2019-07-16 ENCOUNTER — Ambulatory Visit: Payer: Medicare PPO | Admitting: Pulmonary Disease

## 2019-07-25 DIAGNOSIS — J449 Chronic obstructive pulmonary disease, unspecified: Secondary | ICD-10-CM | POA: Diagnosis not present

## 2019-08-07 DIAGNOSIS — M545 Low back pain: Secondary | ICD-10-CM | POA: Diagnosis not present

## 2019-08-07 DIAGNOSIS — G894 Chronic pain syndrome: Secondary | ICD-10-CM | POA: Diagnosis not present

## 2019-08-07 DIAGNOSIS — G8929 Other chronic pain: Secondary | ICD-10-CM | POA: Diagnosis not present

## 2019-08-07 DIAGNOSIS — Z79899 Other long term (current) drug therapy: Secondary | ICD-10-CM | POA: Diagnosis not present

## 2019-08-10 ENCOUNTER — Other Ambulatory Visit (HOSPITAL_COMMUNITY)
Admission: RE | Admit: 2019-08-10 | Discharge: 2019-08-10 | Disposition: A | Discharge status: Home / Self Care | Payer: Medicare PPO | Source: Ambulatory Visit | Attending: Pulmonary Disease | Admitting: Pulmonary Disease

## 2019-08-10 DIAGNOSIS — Z20822 Contact with and (suspected) exposure to covid-19: Secondary | ICD-10-CM | POA: Diagnosis not present

## 2019-08-10 DIAGNOSIS — Z01812 Encounter for preprocedural laboratory examination: Secondary | ICD-10-CM | POA: Insufficient documentation

## 2019-08-10 LAB — SARS CORONAVIRUS 2 (TAT 6-24 HRS): SARS Coronavirus 2: NEGATIVE

## 2019-08-13 ENCOUNTER — Other Ambulatory Visit: Payer: Self-pay

## 2019-08-14 ENCOUNTER — Ambulatory Visit (INDEPENDENT_AMBULATORY_CARE_PROVIDER_SITE_OTHER): Payer: Medicare PPO | Admitting: Pulmonary Disease

## 2019-08-14 ENCOUNTER — Other Ambulatory Visit: Payer: Self-pay

## 2019-08-14 DIAGNOSIS — J449 Chronic obstructive pulmonary disease, unspecified: Secondary | ICD-10-CM | POA: Diagnosis not present

## 2019-08-14 LAB — PULMONARY FUNCTION TEST
DL/VA % pred: 81 %
DL/VA: 3.45 ml/min/mmHg/L
DLCO cor % pred: 71 %
DLCO cor: 12.64 ml/min/mmHg
DLCO unc % pred: 71 %
DLCO unc: 12.64 ml/min/mmHg
FEF 25-75 Post: 1.65 L/sec
FEF 25-75 Pre: 1.25 L/sec
FEF2575-%Change-Post: 31 %
FEF2575-%Pred-Post: 92 %
FEF2575-%Pred-Pre: 70 %
FEV1-%Change-Post: 7 %
FEV1-%Pred-Post: 75 %
FEV1-%Pred-Pre: 70 %
FEV1-Post: 1.52 L
FEV1-Pre: 1.42 L
FEV1FVC-%Change-Post: 0 %
FEV1FVC-%Pred-Pre: 103 %
FEV6-%Change-Post: 6 %
FEV6-%Pred-Post: 75 %
FEV6-%Pred-Pre: 70 %
FEV6-Post: 1.92 L
FEV6-Pre: 1.8 L
FEV6FVC-%Change-Post: -1 %
FEV6FVC-%Pred-Post: 103 %
FEV6FVC-%Pred-Pre: 104 %
FVC-%Change-Post: 7 %
FVC-%Pred-Post: 72 %
FVC-%Pred-Pre: 67 %
FVC-Post: 1.95 L
FVC-Pre: 1.8 L
Post FEV1/FVC ratio: 78 %
Post FEV6/FVC ratio: 98 %
Pre FEV1/FVC ratio: 78 %
Pre FEV6/FVC Ratio: 100 %
RV % pred: 91 %
RV: 1.85 L
TLC % pred: 84 %
TLC: 3.88 L

## 2019-08-14 NOTE — Progress Notes (Signed)
PFT done today. 

## 2019-08-20 ENCOUNTER — Encounter: Payer: Self-pay | Admitting: Pulmonary Disease

## 2019-08-20 ENCOUNTER — Ambulatory Visit: Payer: Medicare PPO | Admitting: Pulmonary Disease

## 2019-08-20 ENCOUNTER — Other Ambulatory Visit: Payer: Self-pay

## 2019-08-20 VITALS — BP 126/60 | HR 99 | Temp 98.7°F | Ht 61.0 in | Wt 308.8 lb

## 2019-08-20 DIAGNOSIS — J449 Chronic obstructive pulmonary disease, unspecified: Secondary | ICD-10-CM | POA: Diagnosis not present

## 2019-08-20 MED ORDER — BREO ELLIPTA 200-25 MCG/INH IN AEPB: 1.0000 | INHALATION_SPRAY | Freq: Every day | RESPIRATORY_TRACT | 0 refills | Status: DC

## 2019-08-20 NOTE — Addendum Note (Signed)
Addended by: Elton Sin on: 08/20/2019 02:35 PM   Modules accepted: Orders

## 2019-08-20 NOTE — Progress Notes (Addendum)
Savannah Burns    [id number]    04-16-1950  Primary Care Physician:Gottschalk, Koleen Distance, DO  Referring Physician: [name], DO [place],  [place] [id number]  Chief complaint: Follow-up for dyspnea  HPI: 69 year old with asthma, hypertension, allergies, COPD, diastolic heart failure, recurrent pneumonia Complains of progressive dyspnea for the past 6 months with worsening over the last 2 months Symptoms with exertion and at rest.  Has occasional cough with mucus production.  No wheezing Seen by cardiology in early February with increasing Lasix to 40 mg twice daily.  States that her lower extremity edema has improved with this but continues to have dyspnea.  Started on supplemental oxygen by her primary care.  Pets: Has dogs, cats, no birds Occupation: Retired Optometrist Exposures: No known exposures.  No mold, hot tub, Jacuzzi Smoking history: 60-pack-year smoker.  Quit in 2017 Travel history: Originally from [place].  No significant recent travel Relevant family history: Still had COPD.  She was a smoker.  Interval history: Continues to have dyspnea on exertion. Given Trelegy at last visit.  States that this helps somewhat with her breathing but she cannot afford the co-pay.  Outpatient Encounter Medications as of 08/20/2019  Medication Sig  . aspirin 81 MG tablet Take 81 mg by mouth daily. Reported on 06/01/2015  . Blood Glucose Calibration (GLUCOMETER DEX HIGH CONTROL) LIQD Test CBG's once daily.   Marland Kitchen escitalopram (LEXAPRO) 20 MG tablet TAKE 1 TABLET BY MOUTH  DAILY (Patient taking differently: Take 20 mg by mouth daily. )  . furosemide (LASIX) 20 MG tablet Take 2 tablets (40 mg total) by mouth 2 (two) times daily.  Marland Kitchen glucose blood (GLUCOMETER DEX TEST SENSORS) test strip Use as instructed  . hydrOXYzine (ATARAX/VISTARIL) 10 MG tablet Take 1 tablet (10 mg total) by mouth 3 (three) times daily as needed for anxiety.  Marland Kitchen ketoconazole  (NIZORAL) 2 % cream   . lisinopril (ZESTRIL) 10 MG tablet TAKE 1 TABLET BY MOUTH  DAILY (Patient taking differently: Take 10 mg by mouth daily. )  . lovastatin (MEVACOR) 20 MG tablet TAKE 1 TABLET BY MOUTH AT  BEDTIME (Patient taking differently: Take 20 mg by mouth at bedtime. )  . metFORMIN (GLUCOPHAGE) 1000 MG tablet TAKE 1 TABLET BY MOUTH  TWICE DAILY WITH A MEAL. (Patient taking differently: Take 1,000 mg by mouth 2 (two) times daily with a meal. )  . oxyCODONE-acetaminophen (PERCOCET) 10-325 MG tablet Take 1 tablet by mouth every 6 (six) hours as needed for pain.  . [DISCONTINUED] Fluticasone-Umeclidin-Vilant (TRELEGY ELLIPTA) 200-62.5-25 MCG/INH AEPB Inhale 1 puff into the lungs daily.  . Fluticasone-Umeclidin-Vilant (TRELEGY ELLIPTA) 200-62.5-25 MCG/INH AEPB Inhale 1 puff into the lungs daily. (Patient not taking: Reported on 08/20/2019)  . [DISCONTINUED] albuterol (PROVENTIL HFA;VENTOLIN HFA) 108 (90 Base) MCG/ACT inhaler Inhale 2 puffs into the lungs every 6 (six) hours as needed for wheezing or shortness of breath.   No facility-administered encounter medications on file as of 08/20/2019.   Physical Exam: Blood pressure 126/60, pulse 99, temperature 98.7 F (37.1 C), temperature source Oral, height 5' 1"  (1.549 m), weight (!) 308 lb 12.8 oz (140.1 kg), SpO2 97 %. Gen:      No acute distress HEENT:  EOMI, sclera anicteric Neck:     No masses; no thyromegaly Lungs:    Clear to auscultation bilaterally; normal respiratory effort CV:         Regular rate and rhythm; no  murmurs Abd:      + bowel sounds; soft, non-tender; no palpable masses, no distension Ext:    No edema; adequate peripheral perfusion Skin:      Warm and dry; no rash Neuro: alert and oriented x 3 Psych: normal mood and affect  Data Reviewed: Imaging: CT chest 12/1/9-subcentimeter pulmonary nodules.  Left upper lobe cavitary nodule. CT chest 09/22/2008-stable pulmonary nodules  CT chest 08/12/2009-stable pulmonary  nodules CT chest 07/03/2014-stable pulmonary nodules, stable left adrenal adenoma CT chest 06/10/2019-stable bilateral pulmonary nodules, no active lung disease. I reviewed images personally.  PFTs: Spirometry 08/21/2015 FVC 2.32 [86%], FEV1 1.86 [6%], F/F 80. No obstruction  08/15/2019 FVC 1.95 [92%], FEV1 1.52 (75%), F/F 78, TLC 3.88 [84%], DLCO 12.64 [71%] Minimal obstruction, mild diffusion defect.  Cardiac: Echocardiogram 03/05/2020- LVEF 70-1 5%, mild LVH, grade 1 diastolic dysfunction with elevated left ventricular pressure.  Normal RV systolic function and size.  Labs: CBC 05/29/2019-WBC 6.9, eos 1.3%, absolute eosinophil count 90 IgE 05/29/2019- 2325 Alpha-1 antitrypsin 05/29/2019-177, PI MM BNP 05/29/19- 58  Sleep Split-night sleep study 12/17/2015 No significant OSA, AHI 1.5.  Mild O2 desats to 88%  Assessment:  Chronic dyspnea, hypoxic respiratory failure Has significant smoking history but prior spirometry's does not show significant obstruction.  There is no evidence of ILD on CT scan Labs are normal except for IgE elevation indicating  Cannot afford Trelegy inhaler.  We will give samples of Breo and referral to pharmacy for inhaler training and patient assistance. She needs to lose significant amount of weight as I suspect her body habitus and deconditioning are playing a role in presentation. Discussed pulmonary rehab but she cannot afford the co-pay.  She will work on weight loss with diet and exercise at home.  Pulmonary nodules Reviewed CT scans up to 2016 which are stable pulmonary nodules, likely due to prior granulomatous infection\  Plan/Recommendations: Breo samples, pharmacy referral Weight loss with diet and exercise.  Marshell Garfinkel MD [facility] 08/20/2019, 1:56 PM  CC: [name], DO

## 2019-08-20 NOTE — Patient Instructions (Signed)
We will give you samples of Breo for now to help with your breathing Continue to work on weight loss with diet and exercise We will also refer you to pharmacy for inhaler training and optimization, patient assistance.

## 2019-08-24 DIAGNOSIS — J449 Chronic obstructive pulmonary disease, unspecified: Secondary | ICD-10-CM | POA: Diagnosis not present

## 2019-09-04 DIAGNOSIS — M5136 Other intervertebral disc degeneration, lumbar region: Secondary | ICD-10-CM | POA: Diagnosis not present

## 2019-09-04 DIAGNOSIS — Z79899 Other long term (current) drug therapy: Secondary | ICD-10-CM | POA: Diagnosis not present

## 2019-09-04 DIAGNOSIS — G894 Chronic pain syndrome: Secondary | ICD-10-CM | POA: Diagnosis not present

## 2019-09-05 ENCOUNTER — Other Ambulatory Visit: Payer: Medicare PPO

## 2019-09-05 ENCOUNTER — Telehealth: Payer: Self-pay | Admitting: Pharmacist

## 2019-09-05 NOTE — Telephone Encounter (Signed)
Ran test claim for Trelegy and Breztri. 1 month supply for both medications is $40.00. Patient had a $445.00 deductible that has been met. Called patient and advised. She stated $40.00 copay would be affordable for her. She will call pharmacy to have them fill her Trelegy prescription. Patient will reach out to office if copay changes to start patient assistance. Nothing further needed at this time.

## 2019-09-05 NOTE — Telephone Encounter (Signed)
Please start BIV for inhaler options.  She is unable to afford Trelegy and is currently maintained on Breo Samples.  She has medicare so may look into [employer] or AZ&Me for Judithann Sauger if she has not spent $600 out of pocket.  Mariella Saa, PharmD, [place], CPP Clinical Specialty Pharmacist (Rheumatology and Pulmonology)  09/05/2019 2:44 PM

## 2019-09-16 ENCOUNTER — Ambulatory Visit: Payer: Medicare PPO | Admitting: Cardiology

## 2019-09-18 ENCOUNTER — Other Ambulatory Visit: Payer: Self-pay | Admitting: *Deleted

## 2019-09-18 DIAGNOSIS — E1159 Type 2 diabetes mellitus with other circulatory complications: Secondary | ICD-10-CM

## 2019-09-18 DIAGNOSIS — F339 Major depressive disorder, recurrent, unspecified: Secondary | ICD-10-CM

## 2019-09-18 DIAGNOSIS — E119 Type 2 diabetes mellitus without complications: Secondary | ICD-10-CM

## 2019-09-18 DIAGNOSIS — E785 Hyperlipidemia, unspecified: Secondary | ICD-10-CM

## 2019-09-18 DIAGNOSIS — F410 Panic disorder [episodic paroxysmal anxiety] without agoraphobia: Secondary | ICD-10-CM

## 2019-09-18 DIAGNOSIS — I10 Essential (primary) hypertension: Secondary | ICD-10-CM

## 2019-09-18 NOTE — Telephone Encounter (Signed)
Gottschalk. NTBS last chkup 07/02/18 mail order not sent

## 2019-09-24 DIAGNOSIS — J449 Chronic obstructive pulmonary disease, unspecified: Secondary | ICD-10-CM | POA: Diagnosis not present

## 2019-09-26 ENCOUNTER — Telehealth: Payer: Self-pay | Admitting: Family Medicine

## 2019-09-27 ENCOUNTER — Telehealth: Payer: Self-pay

## 2019-09-27 DIAGNOSIS — E119 Type 2 diabetes mellitus without complications: Secondary | ICD-10-CM

## 2019-09-27 DIAGNOSIS — E785 Hyperlipidemia, unspecified: Secondary | ICD-10-CM

## 2019-09-27 DIAGNOSIS — I10 Essential (primary) hypertension: Secondary | ICD-10-CM

## 2019-09-27 NOTE — Telephone Encounter (Signed)
Lisinopril, Lovastatin and Metformin prescriptions denied. Patient needs to be seen. Last office visit 04/17/19

## 2019-09-27 NOTE — Telephone Encounter (Signed)
Left message to call back.  Patient's last office visit was 04/17/2019, patient needs to be seen.

## 2019-10-01 NOTE — Telephone Encounter (Signed)
Left detailed message on VM pt needs on office visit with new documentation and testing of oxygen levels

## 2019-10-02 ENCOUNTER — Other Ambulatory Visit: Payer: Self-pay | Admitting: *Deleted

## 2019-10-02 MED ORDER — TRUEPLUS LANCETS 30G MISC
3 refills | Status: DC
Start: 2019-10-02 — End: 2021-09-01

## 2019-10-02 MED ORDER — TRUE METRIX BLOOD GLUCOSE TEST VI STRP
ORAL_STRIP | 3 refills | Status: DC
Start: 2019-10-02 — End: 2021-09-01

## 2019-10-02 MED ORDER — TRUE METRIX AIR GLUCOSE METER W/DEVICE KIT
PACK | 0 refills | Status: DC
Start: 2019-10-02 — End: 2021-09-01

## 2019-10-07 DIAGNOSIS — M545 Low back pain: Secondary | ICD-10-CM | POA: Diagnosis not present

## 2019-10-07 DIAGNOSIS — E559 Vitamin D deficiency, unspecified: Secondary | ICD-10-CM | POA: Diagnosis not present

## 2019-10-07 DIAGNOSIS — Z79899 Other long term (current) drug therapy: Secondary | ICD-10-CM | POA: Diagnosis not present

## 2019-10-07 DIAGNOSIS — G8929 Other chronic pain: Secondary | ICD-10-CM | POA: Diagnosis not present

## 2019-10-07 DIAGNOSIS — G894 Chronic pain syndrome: Secondary | ICD-10-CM | POA: Diagnosis not present

## 2019-10-22 ENCOUNTER — Ambulatory Visit (INDEPENDENT_AMBULATORY_CARE_PROVIDER_SITE_OTHER): Payer: Medicare PPO | Admitting: Family Medicine

## 2019-10-22 DIAGNOSIS — E1169 Type 2 diabetes mellitus with other specified complication: Secondary | ICD-10-CM | POA: Diagnosis not present

## 2019-10-22 DIAGNOSIS — F339 Major depressive disorder, recurrent, unspecified: Secondary | ICD-10-CM | POA: Diagnosis not present

## 2019-10-22 DIAGNOSIS — R600 Localized edema: Secondary | ICD-10-CM | POA: Diagnosis not present

## 2019-10-22 DIAGNOSIS — E785 Hyperlipidemia, unspecified: Secondary | ICD-10-CM

## 2019-10-22 DIAGNOSIS — E1159 Type 2 diabetes mellitus with other circulatory complications: Secondary | ICD-10-CM

## 2019-10-22 DIAGNOSIS — Z9981 Dependence on supplemental oxygen: Secondary | ICD-10-CM

## 2019-10-22 DIAGNOSIS — R0609 Other forms of dyspnea: Secondary | ICD-10-CM

## 2019-10-22 DIAGNOSIS — R0602 Shortness of breath: Secondary | ICD-10-CM | POA: Insufficient documentation

## 2019-10-22 DIAGNOSIS — I10 Essential (primary) hypertension: Secondary | ICD-10-CM | POA: Diagnosis not present

## 2019-10-22 DIAGNOSIS — I152 Hypertension secondary to endocrine disorders: Secondary | ICD-10-CM | POA: Insufficient documentation

## 2019-10-22 MED ORDER — LOVASTATIN 20 MG PO TABS: 20.0000 mg | ORAL_TABLET | Freq: Every day | ORAL | 0 refills | Status: DC

## 2019-10-22 MED ORDER — LISINOPRIL 10 MG PO TABS: 10.0000 mg | ORAL_TABLET | Freq: Every day | ORAL | 0 refills | Status: DC

## 2019-10-22 MED ORDER — FUROSEMIDE 20 MG PO TABS: 40.0000 mg | ORAL_TABLET | Freq: Two times a day (BID) | ORAL | 0 refills | Status: DC

## 2019-10-22 MED ORDER — ESCITALOPRAM OXALATE 20 MG PO TABS: 20.0000 mg | ORAL_TABLET | Freq: Every day | ORAL | 0 refills | Status: DC

## 2019-10-22 MED ORDER — METFORMIN HCL 1000 MG PO TABS: 1000.0000 mg | ORAL_TABLET | Freq: Two times a day (BID) | ORAL | 0 refills | Status: DC

## 2019-10-22 NOTE — Progress Notes (Signed)
Telephone visit  Subjective: CC: DM, HTN, HLD PCP: [name], DO KGU:[id number] ARZU MCGAUGHEY is a 69 y.o. female calls for telephone consult today. Patient provides verbal consent for consult held via phone.  Due to COVID-19 pandemic this visit was conducted virtually. This visit type was conducted due to national recommendations for restrictions regarding the COVID-19 Pandemic (e.g. social distancing, sheltering in place) in an effort to limit this patient's exposure and mitigate transmission in our community. All issues noted in this document were discussed and addressed.  A physical exam was not performed with this format.   Location of patient: home Location of provider: [facility] Others present for call: none  1. Type 2 Diabetes w/ HTN, HLD:  Patient reports that she has been noncompliant with meds due to depression.  She notes depressive symptoms have resolved and she started back taking her meds but is now out.  She has not followed up with cardiology due to financial restrictions.  Apparently cost $40 per visit.  She does report lower extremity edema.  No chest pain.  She has chronic shortness of breath which requires supplemental oxygen.  She is followed closely by pulmonology for this.  She is asking for a concentrator today.  Last eye exam: Needs Last foot exam: Needs Last A1c:  Lab Results  Component Value Date   HGBA1C 7.2 (H) 02/28/2019   Nephropathy screen indicated?:  On ACE inhibitor Last flu, zoster and/or pneumovax:  Immunization History  Administered Date(s) Administered  . Hepatitis B 05/30/1991, 06/27/1991, 12/04/1991  . Influenza Whole 01/04/2008, 03/24/2009  . Influenza, High Dose Seasonal PF 01/17/2018, 12/29/2018  . Influenza,inj,Quad PF,6+ Mos 11/27/2014  . Influenza-Unspecified 01/26/2013, 03/01/2014  . Pneumococcal Conjugate-13 03/01/2014  . Pneumococcal Polysaccharide-23 09/08/2015  . Td 05/27/2002  . Zoster 02/14/2013     2. Chronic dyspnea, O2  dependent She is trying to lose weight to help breathing.  She is O2 dependent now.  She is followed by pulmonology.  She wants an O2 concentrator if possible.  Dyspnea is typically with exertion.  She tried to really be strict about diet for about a month but only "lost 1 pound".  She does admit to lower extremity edema.  ROS: Per HPI  Allergies  Allergen Reactions  . Keflex [Cephalexin] Nausea And Vomiting   Past Medical History:  Diagnosis Date  . Adrenal adenoma, left    Stable  . Anxiety   . Arthritis    bilateral hands  . CHF (congestive heart failure) ([place])   . COPD (chronic obstructive pulmonary disease) ([place])   . Depression   . Diabetes mellitus without complication ([place])   . Dyspnea   . Grade II diastolic dysfunction   . History of kidney stones   . Hyperlipidemia   . Hypertension   . Lower back pain   . Panic attacks   . Pneumonia    currently taking antibiotic and prednisone for early stages of pneumonia  . Pulmonary nodules    bilateral  . Skin cancer    face    Current Outpatient Medications:  .  aspirin 81 MG tablet, Take 81 mg by mouth daily. Reported on 06/01/2015, Disp: , Rfl:  .  Blood Glucose Calibration (GLUCOMETER DEX HIGH CONTROL) LIQD, Test CBG's once daily. , Disp: , Rfl:  .  Blood Glucose Monitoring Suppl (TRUE METRIX AIR GLUCOSE METER) w/Device KIT, Check BS daily Dx E11.9, Disp: 1 kit, Rfl: 0 .  escitalopram (LEXAPRO) 20 MG tablet, TAKE 1 TABLET BY  MOUTH  DAILY (Patient taking differently: Take 20 mg by mouth daily. ), Disp: 90 tablet, Rfl: 3 .  fluticasone furoate-vilanterol (BREO ELLIPTA) 200-25 MCG/INH AEPB, Inhale 1 puff into the lungs daily., Disp: 14 each, Rfl: 0 .  Fluticasone-Umeclidin-Vilant (TRELEGY ELLIPTA) 200-62.5-25 MCG/INH AEPB, Inhale 1 puff into the lungs daily. (Patient not taking: Reported on 08/20/2019), Disp: 14 each, Rfl: 0 .  furosemide (LASIX) 20 MG tablet, Take 2 tablets (40 mg total) by mouth 2 (two) times daily., Disp: 360  tablet, Rfl: 0 .  glucose blood (TRUE METRIX BLOOD GLUCOSE TEST) test strip, Check BS daily Dx E11.9, Disp: 100 each, Rfl: 3 .  hydrOXYzine (ATARAX/VISTARIL) 10 MG tablet, Take 1 tablet (10 mg total) by mouth 3 (three) times daily as needed for anxiety., Disp: 60 tablet, Rfl: 2 .  ketoconazole (NIZORAL) 2 % cream, , Disp: , Rfl:  .  lisinopril (ZESTRIL) 10 MG tablet, TAKE 1 TABLET BY MOUTH  DAILY (Patient taking differently: Take 10 mg by mouth daily. ), Disp: 90 tablet, Rfl: 0 .  lovastatin (MEVACOR) 20 MG tablet, TAKE 1 TABLET BY MOUTH AT  BEDTIME (Patient taking differently: Take 20 mg by mouth at bedtime. ), Disp: 90 tablet, Rfl: 0 .  metFORMIN (GLUCOPHAGE) 1000 MG tablet, TAKE 1 TABLET BY MOUTH  TWICE DAILY WITH A MEAL. (Patient taking differently: Take 1,000 mg by mouth 2 (two) times daily with a meal. ), Disp: 180 tablet, Rfl: 0 .  oxyCODONE-acetaminophen (PERCOCET) 10-325 MG tablet, Take 1 tablet by mouth every 6 (six) hours as needed for pain., Disp: , Rfl:  .  TRUEplus Lancets 30G MISC, Check BS daily Dx E11.9, Disp: 100 each, Rfl: 3  Assessment/ Plan: 69 y.o. female   1. Type 2 diabetes mellitus with other specified complication, without long-term current use of insulin (HCC) Likely not controlled due to noncompliance.  56-monthMetformin prescription sent.  She will come in for labs.  We will adjust medication regimen pending lab results - Bayer DCA Hb A1c Waived; Future - metFORMIN (GLUCOPHAGE) 1000 MG tablet; Take 1 tablet (1,000 mg total) by mouth 2 (two) times daily with a meal.  Dispense: 180 tablet; Refill: 0  2. Hyperlipidemia associated with type 2 diabetes mellitus ([place] - CMP14+EGFR; Future - Lipid panel; Future - TSH; Future - lovastatin (MEVACOR) 20 MG tablet; Take 1 tablet (20 mg total) by mouth at bedtime.  Dispense: 90 tablet; Refill: 0  3. Hypertension associated with diabetes ([place] She will need to get vital signs performed here when she comes into the office -  CMP14+EGFR; Future - furosemide (LASIX) 20 MG tablet; Take 2 tablets (40 mg total) by mouth 2 (two) times daily.  Dispense: 360 tablet; Refill: 0 - lisinopril (ZESTRIL) 10 MG tablet; Take 1 tablet (10 mg total) by mouth daily.  Dispense: 90 tablet; Refill: 0  4. Chronic dyspnea - PR PORTABLE OXYGEN CONCENTRATOR  5. Supplemental oxygen dependent - PR PORTABLE OXYGEN CONCENTRATOR  6. Depression, recurrent ([place] Stable - escitalopram (LEXAPRO) 20 MG tablet; Take 1 tablet (20 mg total) by mouth daily.  Dispense: 90 tablet; Refill: 0  7. Bilateral lower extremity edema Compression hose prescription placed upfront along with her concentrator prescription - Compression stockings   Start time: 1:46pm End time: 1:58pm  Total time spent on patient care (including telephone call/ virtual visit): 19 minutes  AKillen DGirard([phone]

## 2019-10-24 DIAGNOSIS — J449 Chronic obstructive pulmonary disease, unspecified: Secondary | ICD-10-CM | POA: Diagnosis not present

## 2019-10-29 ENCOUNTER — Other Ambulatory Visit: Payer: Medicare PPO

## 2019-10-29 ENCOUNTER — Other Ambulatory Visit: Payer: Self-pay

## 2019-10-29 DIAGNOSIS — E1169 Type 2 diabetes mellitus with other specified complication: Secondary | ICD-10-CM | POA: Diagnosis not present

## 2019-10-29 DIAGNOSIS — I10 Essential (primary) hypertension: Secondary | ICD-10-CM | POA: Diagnosis not present

## 2019-10-29 DIAGNOSIS — E1159 Type 2 diabetes mellitus with other circulatory complications: Secondary | ICD-10-CM | POA: Diagnosis not present

## 2019-10-29 DIAGNOSIS — E785 Hyperlipidemia, unspecified: Secondary | ICD-10-CM

## 2019-10-30 ENCOUNTER — Other Ambulatory Visit (INDEPENDENT_AMBULATORY_CARE_PROVIDER_SITE_OTHER): Payer: Medicare PPO | Admitting: *Deleted

## 2019-10-30 ENCOUNTER — Other Ambulatory Visit: Payer: Self-pay | Admitting: Family Medicine

## 2019-10-30 DIAGNOSIS — R0609 Other forms of dyspnea: Secondary | ICD-10-CM

## 2019-10-30 DIAGNOSIS — E785 Hyperlipidemia, unspecified: Secondary | ICD-10-CM

## 2019-10-30 DIAGNOSIS — I5032 Chronic diastolic (congestive) heart failure: Secondary | ICD-10-CM

## 2019-10-30 DIAGNOSIS — E1169 Type 2 diabetes mellitus with other specified complication: Secondary | ICD-10-CM

## 2019-10-30 DIAGNOSIS — Z9981 Dependence on supplemental oxygen: Secondary | ICD-10-CM

## 2019-10-30 DIAGNOSIS — J449 Chronic obstructive pulmonary disease, unspecified: Secondary | ICD-10-CM

## 2019-10-30 LAB — CMP14+EGFR
ALT: 11 IU/L (ref 0–32)
AST: 17 IU/L (ref 0–40)
Albumin/Globulin Ratio: 0.8 — ABNORMAL LOW (ref 1.2–2.2)
Albumin: 3.2 g/dL — ABNORMAL LOW (ref 3.8–4.8)
Alkaline Phosphatase: 105 IU/L (ref 48–121)
BUN/Creatinine Ratio: 19 (ref 12–28)
BUN: 12 mg/dL (ref 8–27)
Bilirubin Total: 0.2 mg/dL (ref 0.0–1.2)
CO2: 28 mmol/L (ref 20–29)
Calcium: 8.3 mg/dL — ABNORMAL LOW (ref 8.7–10.3)
Chloride: 101 mmol/L (ref 96–106)
Creatinine, Ser: 0.63 mg/dL (ref 0.57–1.00)
GFR calc Af Amer: 105 mL/min/{1.73_m2} (ref >59)
GFR calc non Af Amer: 91 mL/min/{1.73_m2} (ref >59)
Globulin, Total: 3.9 g/dL (ref 1.5–4.5)
Glucose: 176 mg/dL — ABNORMAL HIGH (ref 65–99)
Potassium: 4.1 mmol/L (ref 3.5–5.2)
Sodium: 138 mmol/L (ref 134–144)
Total Protein: 7.1 g/dL (ref 6.0–8.5)

## 2019-10-30 LAB — LIPID PANEL
Chol/HDL Ratio: 3.9 ratio (ref 0.0–4.4)
Cholesterol, Total: 122 mg/dL (ref 100–199)
HDL: 31 mg/dL — ABNORMAL LOW (ref >39)
LDL Chol Calc (NIH): 76 mg/dL (ref 0–99)
Triglycerides: 70 mg/dL (ref 0–149)
VLDL Cholesterol Cal: 15 mg/dL (ref 5–40)

## 2019-10-30 LAB — HGB A1C W/O EAG: Hgb A1c MFr Bld: 6.6 % — ABNORMAL HIGH (ref 4.8–5.6)

## 2019-10-30 LAB — TSH: TSH: 2.09 u[IU]/mL (ref 0.450–4.500)

## 2019-10-30 MED ORDER — METFORMIN HCL 1000 MG PO TABS: 1000.0000 mg | ORAL_TABLET | Freq: Two times a day (BID) | ORAL | 3 refills | Status: DC

## 2019-10-30 MED ORDER — LOVASTATIN 20 MG PO TABS: 20.0000 mg | ORAL_TABLET | Freq: Every day | ORAL | 3 refills | Status: DC

## 2019-10-30 NOTE — Progress Notes (Signed)
done

## 2019-11-04 ENCOUNTER — Telehealth: Payer: Self-pay | Admitting: Family Medicine

## 2019-11-04 ENCOUNTER — Other Ambulatory Visit: Payer: Self-pay | Admitting: Family Medicine

## 2019-11-04 DIAGNOSIS — Z9981 Dependence on supplemental oxygen: Secondary | ICD-10-CM

## 2019-11-04 DIAGNOSIS — R0609 Other forms of dyspnea: Secondary | ICD-10-CM

## 2019-11-04 DIAGNOSIS — J449 Chronic obstructive pulmonary disease, unspecified: Secondary | ICD-10-CM

## 2019-11-04 NOTE — Telephone Encounter (Signed)
Spoke with patient.  Wants second opinion.  Referral placed.

## 2019-11-06 ENCOUNTER — Telehealth: Payer: Self-pay | Admitting: Family Medicine

## 2019-11-08 DIAGNOSIS — M545 Low back pain: Secondary | ICD-10-CM | POA: Diagnosis not present

## 2019-11-08 DIAGNOSIS — Z79899 Other long term (current) drug therapy: Secondary | ICD-10-CM | POA: Diagnosis not present

## 2019-11-08 DIAGNOSIS — G894 Chronic pain syndrome: Secondary | ICD-10-CM | POA: Diagnosis not present

## 2019-11-08 DIAGNOSIS — G8929 Other chronic pain: Secondary | ICD-10-CM | POA: Diagnosis not present

## 2019-11-20 DIAGNOSIS — R918 Other nonspecific abnormal finding of lung field: Secondary | ICD-10-CM | POA: Diagnosis not present

## 2019-11-20 DIAGNOSIS — Z87891 Personal history of nicotine dependence: Secondary | ICD-10-CM | POA: Diagnosis not present

## 2019-11-20 DIAGNOSIS — J984 Other disorders of lung: Secondary | ICD-10-CM | POA: Diagnosis not present

## 2019-11-20 DIAGNOSIS — Z9981 Dependence on supplemental oxygen: Secondary | ICD-10-CM | POA: Diagnosis not present

## 2019-11-20 DIAGNOSIS — J9611 Chronic respiratory failure with hypoxia: Secondary | ICD-10-CM | POA: Diagnosis not present

## 2019-11-20 DIAGNOSIS — J449 Chronic obstructive pulmonary disease, unspecified: Secondary | ICD-10-CM | POA: Diagnosis not present

## 2019-11-20 DIAGNOSIS — I5030 Unspecified diastolic (congestive) heart failure: Secondary | ICD-10-CM | POA: Diagnosis not present

## 2019-11-20 DIAGNOSIS — Z6841 Body Mass Index (BMI) 40.0 and over, adult: Secondary | ICD-10-CM | POA: Diagnosis not present

## 2019-11-20 DIAGNOSIS — E119 Type 2 diabetes mellitus without complications: Secondary | ICD-10-CM | POA: Diagnosis not present

## 2019-11-20 DIAGNOSIS — E669 Obesity, unspecified: Secondary | ICD-10-CM | POA: Diagnosis not present

## 2019-11-20 DIAGNOSIS — G4733 Obstructive sleep apnea (adult) (pediatric): Secondary | ICD-10-CM | POA: Diagnosis not present

## 2019-11-22 ENCOUNTER — Other Ambulatory Visit: Payer: Self-pay | Admitting: *Deleted

## 2019-11-22 ENCOUNTER — Telehealth: Payer: Self-pay | Admitting: Family Medicine

## 2019-11-22 DIAGNOSIS — Z9981 Dependence on supplemental oxygen: Secondary | ICD-10-CM

## 2019-11-22 DIAGNOSIS — R0609 Other forms of dyspnea: Secondary | ICD-10-CM

## 2019-11-22 NOTE — Telephone Encounter (Signed)
Spoke to patient she states that she needs a new oxygen machine with a concentrator as she is up to 8 units as she is moving around and the machine that she currently has only goes up to 5.  Patient also needs a potty chair.  Rxs need to be sent to [facility]

## 2019-11-22 NOTE — Telephone Encounter (Signed)
REFERRAL REQUEST Telephone Note  Have you been seen at our office for this problem? Yes (Advise that they may need an appointment with their PCP before a referral can be done)  Reason for Referral: Breathing Problems Referral discussed with patient: Yes Best contact number of patient for referral team:   [phone] Has patient been seen by a specialist for this issue before: Yes Patient provider preference for referral: Dr Doyle Askew Patient location preference for referral: [facility]   Patient notified that referrals can take up to a week or longer to process. If they haven't heard anything within a week they should call back and speak with the referral department.   Pulmonologist Therapist  Gottschalk's pt.  Sleep Study as well-prefer [employer] is not putting out the oxygen she needs.

## 2019-11-22 NOTE — Telephone Encounter (Signed)
POTTY CHAIR ORDER PRINTED

## 2019-11-22 NOTE — Telephone Encounter (Signed)
I'm not totally sure that a concentrator has that capacity.  May need portable O2 tank.  Will cc to cathy to check with the supplier. Ok to write rx for potty chair.  I will [place]

## 2019-11-22 NOTE — Telephone Encounter (Signed)
Spoke to patient she is confused of what office is doing her referral and order for sleep study I reviewed chart form Pulmonology - they are setting up [place] per note - notified patient of this and advised that I could not see their orders for the sleep study and reccommended that she contact their office for further deatils

## 2019-11-24 DIAGNOSIS — J449 Chronic obstructive pulmonary disease, unspecified: Secondary | ICD-10-CM | POA: Diagnosis not present

## 2019-11-26 NOTE — Telephone Encounter (Signed)
Talked with patient, I am sending message to [place] w/ [employer]. There is an oxygen order from 10/30/19 will see if this is what is needed to help with her situation.

## 2019-11-26 NOTE — Telephone Encounter (Signed)
TC from [facility] ins to get help for patient Patient unable to get up and even walk to commode chair without being SOB. She can not turn her oxygen up past 5L. [employer] told her she would need 2 concentrators to be able to go to 8L and would need an order from her PCP Pt is also not using her Trilegoy inhaler as prescribed d/t cost, do we have any samples, I do not see this on her med list, may have been given by her pulmonologist

## 2019-11-26 NOTE — Telephone Encounter (Signed)
Pt upset crying. She says she is struggling to breath. She wants an update about the concentrator. Please call back.

## 2019-11-26 NOTE — Telephone Encounter (Signed)
She is aware we will discuss meds at next visit, very concerned with oxygen situation and wants answers today

## 2019-11-27 NOTE — Telephone Encounter (Signed)
Pt coming in tomorrow for oxygen testing She needs O2 level resting at room air Walking, then walking on 4L, then on 6L

## 2019-11-27 NOTE — Telephone Encounter (Signed)
Spoke to Dr Verlee Monte.  He recommends O2 sat goal of 88-92%.  DOES NOT recommend going above this given CO2 levels in blood.  She risks respiratory depression above these levels.   Would stick with liters per min that keep her at 88-92% O2 on pulse ox (might need to be adjusted pending activity level). She was saturating 98% on 6L so likely does not need 6L per minute.

## 2019-11-27 NOTE — Telephone Encounter (Signed)
I would really like the input of her pulmonologist on Liters Per Min, O2 saturation goals given recent notes denoting concerns for pulmonary obstruction and recommendations for in patient testing PRIOR to placing any orders.  Trelegy is recommended and noted on her last note from pulmonology 8/26.

## 2019-11-28 ENCOUNTER — Ambulatory Visit: Payer: Medicare PPO

## 2019-11-29 ENCOUNTER — Telehealth: Payer: Self-pay | Admitting: Family Medicine

## 2019-11-29 ENCOUNTER — Ambulatory Visit: Payer: Medicare PPO

## 2019-11-29 NOTE — Telephone Encounter (Signed)
Pt called the office to cancel her appt for today. States she is struggling to breathe and cannot make it to her bathroom or out of the house to come to her appointment. Spoke with York Cerise and advised pt to call EMS to go to the ER due to breathing difficulty. Pt stated "Might be best if I just quite breathing. I am not going to the hospital." Then the call was disconnected by the pt.

## 2019-12-03 ENCOUNTER — Telehealth: Payer: Self-pay | Admitting: Family Medicine

## 2019-12-03 DIAGNOSIS — Z7409 Other reduced mobility: Secondary | ICD-10-CM

## 2019-12-03 NOTE — Telephone Encounter (Signed)
Referral to CCM placed

## 2019-12-03 NOTE — Telephone Encounter (Signed)
Pt states that she can barley walk and is needing assistance at home and transportation. She states grandkids have went back to school and her daughter is having to work. Please advise.

## 2019-12-03 NOTE — Telephone Encounter (Signed)
Patient aware and verbalized understanding. °

## 2019-12-04 ENCOUNTER — Telehealth: Payer: Self-pay

## 2019-12-04 NOTE — Chronic Care Management (AMB) (Signed)
  Chronic Care Management   Note  12/04/2019 Name: MYONNA CHISOM MRN: [id number] DOB: 1950/08/09  RICKIYA PICARIELLO is a 69 y.o. year old female who is a primary care patient of [name], DO. I reached out to [name] by phone today in response to a referral sent by Ms. Coral Spikes Trinkle's PCP, Dr. Lajuana Ripple     Ms. Hankin was given information about Chronic Care Management services today including:  1. CCM service includes personalized support from designated clinical staff supervised by her physician, including individualized plan of care and coordination with other care providers 2. 24/7 contact phone numbers for assistance for urgent and routine care needs. 3. Service will only be billed when office clinical staff spend 20 minutes or more in a month to coordinate care. 4. Only one practitioner may furnish and bill the service in a calendar month. 5. The patient may stop CCM services at any time (effective at the end of the month) by phone call to the office staff. 6. The patient will be responsible for cost sharing (co-pay) of up to 20% of the service fee (after annual deductible is met).  Patient agreed to services and verbal consent obtained.   Follow up plan: Telephone appointment with care management team member scheduled for:12/05/2019  Noreene Larsson, [address], [place], [place] [address] Direct Dial: [phone] [email or web address]

## 2019-12-05 ENCOUNTER — Ambulatory Visit (INDEPENDENT_AMBULATORY_CARE_PROVIDER_SITE_OTHER): Payer: Medicare PPO | Admitting: Licensed Clinical Social Worker

## 2019-12-05 DIAGNOSIS — F339 Major depressive disorder, recurrent, unspecified: Secondary | ICD-10-CM

## 2019-12-05 DIAGNOSIS — E785 Hyperlipidemia, unspecified: Secondary | ICD-10-CM

## 2019-12-05 DIAGNOSIS — I152 Hypertension secondary to endocrine disorders: Secondary | ICD-10-CM

## 2019-12-05 DIAGNOSIS — E1159 Type 2 diabetes mellitus with other circulatory complications: Secondary | ICD-10-CM

## 2019-12-05 DIAGNOSIS — J449 Chronic obstructive pulmonary disease, unspecified: Secondary | ICD-10-CM

## 2019-12-05 DIAGNOSIS — E1169 Type 2 diabetes mellitus with other specified complication: Secondary | ICD-10-CM

## 2019-12-05 NOTE — Patient Instructions (Addendum)
Licensed Clinical Social Worker Visit Information  Goals we discussed today:  Goals Addressed              This Visit's Progress   .  Client will talk with LCSW in next 30 days to discuss depression issues of client and management of depression issues (pt-stated)        CARE PLAN ENTRY   Current Barriers:  Patient with chronic diagnoses of Depression, Obesity, DM, HTN, COPD, DJD, HLD  Clinical Social Work Clinical Goal(s):  Marland Kitchen LCSW will call client in next 30 days to discuss depression issues of client and client management of depression issues  Interventions: . Talked with clinet about CCM program support . Talked with client about needs of client . Talked with client about mobility of client . Talked with client about ADLs completion of client (she said she cannot step anymore into bathtub) . Talked with client about meal provision for client . Talked with client about DME (has a walker with a seat, 3 in 1 bedside commode,oxygen system) . Talked with client about breathing issues and oxygen level check for patient . Talked with client about [employer] support . Talked with Savannah Burns about financial needs (she is on a set income each month and said she has some financial challenges) . Talked with client about mood of client . Talked with client about depression issue of client . Talked with client about mobility in home setting (she said her daughter is helping her to rearrange furniture in the home to help client with mobility) . Talked with client about sleeping issues of client . Talked with client about edema issues . Talked with client about pain issues of client . Talked with client about social support network (daughter is supportive, grandchildren are supportive but are in school currently) . Talked with client about her upcoming medical appointments . Provided counseling support for client . Talked with client about Emergency planning/management officer transport service (LCSW gave client  phone number for [place]) . Collaborated with RNCM regarding nursing needs of client  Patient Self Care Activities:   Eats meals with set up assistance   Patient Self Care Deficits:  Shortness of breath (breathing challenges, uses oxygen as needed) Mobility issues Transport challenges  Initial goal documentation        Materials Provided: No  Follow Up Plan: LCSW to call client in next 4 weeks to talk with her about depression issues of client and to talk with client about her management of depression issues faced  The patient verbalized understanding of instructions provided today and declined a print copy of patient instruction materials.   [name].Butler Vegh MSW, LCSW Licensed Clinical Social Worker [facility] Care Management [phone]

## 2019-12-05 NOTE — Chronic Care Management (AMB) (Signed)
Chronic Care Management    Clinical Social Work Follow Up Note  12/05/2019 Name: Savannah Burns MRN: [id number] DOB: 1950-04-10  Savannah Burns is a 69 y.o. year old female who is a primary care patient of [name], DO. The CCM team was consulted for assistance with [employer] .   Review of patient status, including review of consultants reports, other relevant assessments, and collaboration with appropriate care team members and the patient's provider was performed as part of comprehensive patient evaluation and provision of chronic care management services.    SDOH (Social Determinants of Health) assessments performed: Yes;risk for depression;risk for tobacco use; risk for stress; risk for physical inactivity;risk for financial strain    Chronic Care Management from 12/05/2019 in [facility]  PHQ-9 Total Score 6       GAD 7 : Generalized Anxiety Score 12/05/2019 12/22/2017  Nervous, Anxious, on Edge 1 1  Control/stop worrying 1 3  Worry too much - different things 1 1  Trouble relaxing 0 1  Restless 0 0  Easily annoyed or irritable 1 1  Afraid - awful might happen 0 3  Total GAD 7 Score 4 10  Anxiety Difficulty Somewhat difficult Somewhat difficult    Outpatient Encounter Medications as of 12/05/2019  Medication Sig  . aspirin 81 MG tablet Take 81 mg by mouth daily. Reported on 06/01/2015  . Blood Glucose Calibration (GLUCOMETER DEX HIGH CONTROL) LIQD Test CBG's once daily.   . Blood Glucose Monitoring Suppl (TRUE METRIX AIR GLUCOSE METER) w/Device KIT Check BS daily Dx E11.9  . escitalopram (LEXAPRO) 20 MG tablet Take 1 tablet (20 mg total) by mouth daily.  . furosemide (LASIX) 20 MG tablet Take 2 tablets (40 mg total) by mouth 2 (two) times daily.  Marland Kitchen glucose blood (TRUE METRIX BLOOD GLUCOSE TEST) test strip Check BS daily Dx E11.9  . hydrOXYzine (ATARAX/VISTARIL) 10 MG tablet Take 1 tablet (10 mg total) by mouth 3 (three) times daily  as needed for anxiety.  Marland Kitchen ketoconazole (NIZORAL) 2 % cream   . lisinopril (ZESTRIL) 10 MG tablet Take 1 tablet (10 mg total) by mouth daily.  Marland Kitchen lovastatin (MEVACOR) 20 MG tablet Take 1 tablet (20 mg total) by mouth at bedtime.  . metFORMIN (GLUCOPHAGE) 1000 MG tablet Take 1 tablet (1,000 mg total) by mouth 2 (two) times daily with a meal.  . oxyCODONE-acetaminophen (PERCOCET) 10-325 MG tablet Take 1 tablet by mouth every 6 (six) hours as needed for pain.  . TRUEplus Lancets 30G MISC Check BS daily Dx E11.9  . [DISCONTINUED] albuterol (PROVENTIL HFA;VENTOLIN HFA) 108 (90 Base) MCG/ACT inhaler Inhale 2 puffs into the lungs every 6 (six) hours as needed for wheezing or shortness of breath.   No facility-administered encounter medications on file as of 12/05/2019.    Goals Addressed              This Visit's Progress   .  Client will talk with LCSW in next 30 days to discuss depression issues of client and manaement of depression issues        CARE PLAN ENTRY   Current Barriers:  Patient with chronic diagnoses of Depression, Obesity, DM, HTN, COPD, DJD, HLD  Clinical Social Work Clinical Goal(s):   Clinical Social Work Clinical Goal(s):  Marland Kitchen LCSW will call client in next 30 days to discuss depression issues of client and client management of depression issues  Interventions: . Talked with clinet about CCM program support .  Talked with client about needs of client . Talked with client about mobility of client . Talked with client about ADLs completion of client (she said she cannot step anymore into bathtub) . Talked with client about meal provision for client . Talked with client about DME (has a walker with a seat, 3 in 1 bedside commode,oxygen system) . Talked with client about breathing issues and oxygen level check for patient . Talked with client about [employer] support . Talked with [name] about financial needs (she is on a set income each month and said she has some financial  challenges) . Talked with client about mood of client . Talked with client about depression issue of client . Talked with client about mobility in home setting (she said her daughter is helping her to rearrange furniture in the home to help client with mobility) . Talked with client about sleeping issues of client . Talked with client about edema issues . Talked with client about pain issues of client . Talked with client about social support network (daughter is supportive, grandchildren are supportive but are in school currently) . Talked with client about her upcoming medical appointments . Provided counseling support for client . Talked with client about Emergency planning/management officer transport service (LCSW gave client phone number for [place]) . Collaborated with RNCM regarding nursing needs of client  Patient Self Care Activities:   Eats meals with set up assistance   Patient Self Care Deficits:  Shortness of breath (breathing challenges, uses oxygen as needed) Mobility issues Transport challenges  Initial goal documentation       Follow Up Plan: LCSW to call client in next 4 weeks to talk with her about depression issues of client and to talk with client about her management of depression issues faced  [name].Kerianna Rawlinson MSW, LCSW Licensed Clinical Social Worker [facility] Care Management [phone]

## 2019-12-09 ENCOUNTER — Ambulatory Visit: Payer: Medicare PPO | Admitting: *Deleted

## 2019-12-09 DIAGNOSIS — J449 Chronic obstructive pulmonary disease, unspecified: Secondary | ICD-10-CM

## 2019-12-09 DIAGNOSIS — F339 Major depressive disorder, recurrent, unspecified: Secondary | ICD-10-CM | POA: Diagnosis not present

## 2019-12-09 DIAGNOSIS — F1120 Opioid dependence, uncomplicated: Secondary | ICD-10-CM | POA: Diagnosis not present

## 2019-12-09 DIAGNOSIS — G894 Chronic pain syndrome: Secondary | ICD-10-CM | POA: Diagnosis not present

## 2019-12-09 DIAGNOSIS — I10 Essential (primary) hypertension: Secondary | ICD-10-CM | POA: Diagnosis not present

## 2019-12-09 DIAGNOSIS — E1169 Type 2 diabetes mellitus with other specified complication: Secondary | ICD-10-CM | POA: Diagnosis not present

## 2019-12-09 DIAGNOSIS — E1159 Type 2 diabetes mellitus with other circulatory complications: Secondary | ICD-10-CM

## 2019-12-09 DIAGNOSIS — G8929 Other chronic pain: Secondary | ICD-10-CM | POA: Diagnosis not present

## 2019-12-09 DIAGNOSIS — M545 Low back pain: Secondary | ICD-10-CM | POA: Diagnosis not present

## 2019-12-09 DIAGNOSIS — E785 Hyperlipidemia, unspecified: Secondary | ICD-10-CM | POA: Diagnosis not present

## 2019-12-09 DIAGNOSIS — Z79899 Other long term (current) drug therapy: Secondary | ICD-10-CM | POA: Diagnosis not present

## 2019-12-09 NOTE — Patient Instructions (Signed)
Visit Information  Goals Addressed            This Visit's Progress   . Chronic Disease Management Needs       CARE PLAN ENTRY (see longtitudinal plan of care for additional care plan information)  Current Barriers:  . Chronic Disease Management support, education, and care coordination needs related to Depression, Obesity, DM, HTN, COPD, DJD, HLD  Clinical Goal(s) related to Depression, Obesity, DM, HTN, COPD, DJD, HLD:  Over the next Depression, Obesity, DM, HTN, COPD, DJD, HLD days, patient will:  . Work with the care management team to address educational, disease management, and care coordination needs  . Begin or continue self health monitoring activities as directed today Measure and record cbg (blood glucose) 1 times daily and Measure and record blood pressure 3 times per week . Call provider office for new or worsened signs and symptoms Blood glucose findings outside established parameters and Blood pressure findings outside established parameters . Call care management team with questions or concerns . Verbalize basic understanding of patient centered plan of care established today  Interventions related to Depression, Obesity, DM, HTN, COPD, DJD, HLD:  . Evaluation of current treatment plans and patient's adherence to plan as established by provider . Assessed patient understanding of disease states . Assessed patient's education and care coordination needs . Provided disease specific education to patient  . Collaborated with appropriate clinical care team members regarding patient needs . Chart reviewed including recent office notes, telephone notes, referrals, and lab results . Discussed transportation concerns o Provided information on RCATS . Discussed ambulation with walker and ability to perform ADLs . Reviewed and discussed medications . Discussed used of bedside commode . Advised that she needs a visit with Dr Lajuana Ripple. Will have front office staff arrange  this . Reviewed and discussed most recent recommendations from pulmonologist, Dr Verlee Monte . Discussed home health and in-home care needs . Discussed use of oxygen. Has portable tanks for traveling outside of the home.  . Encouraged patient to reach out to CCM team as needed  Patient Self Care Activities related to Depression, Obesity, DM, HTN, COPD, DJD, HLD:  . Patient is unable to independently self-manage chronic health conditions  Initial goal documentation       Patient verbalizes understanding of instructions provided today.   Follow-up Plan The care management team will reach out to the patient again over the next 2 days.   [name], BSN, RN-BC Embedded Chronic Care Manager [facility] Direct Dial: [phone]

## 2019-12-09 NOTE — Chronic Care Management (AMB) (Signed)
Chronic Care Management   Follow Up Note   12/09/2019 Name: Savannah Burns MRN: [id number] DOB: 16-May-1950  Referred by: [name], DO Reason for referral : Chronic Care Management (Initial RN visit)   Savannah Burns is a 69 y.o. year old female who is a primary care patient of [name], DO. The CCM team was consulted for assistance with chronic disease management and care coordination needs.    Review of patient status, including review of consultants reports, relevant laboratory and other test results, and collaboration with appropriate care team members and the patient's provider was performed as part of comprehensive patient evaluation and provision of chronic care management services.    Subjective: I talked with [name] by telephone today regarding management of her chronic medical conditions.   SDOH (Social Determinants of Health) assessments performed: Yes See Care Plan activities for detailed interventions related to SDOH)  SDOH Interventions     Most Recent Value  SDOH Interventions  Physical Activity Interventions Other (Comments)  [discussed pulmonary rehab and potentially home health PT/OT]  Transportation Interventions Other (Comment)  [RCATS]      Objective: Outpatient Encounter Medications as of 12/09/2019  Medication Sig  . aspirin 81 MG tablet Take 81 mg by mouth daily. Reported on 06/01/2015  . Blood Glucose Calibration (GLUCOMETER DEX HIGH CONTROL) LIQD Test CBG's once daily.   . Blood Glucose Monitoring Suppl (TRUE METRIX AIR GLUCOSE METER) w/Device KIT Check BS daily Dx E11.9  . escitalopram (LEXAPRO) 20 MG tablet Take 1 tablet (20 mg total) by mouth daily.  . furosemide (LASIX) 20 MG tablet Take 2 tablets (40 mg total) by mouth 2 (two) times daily.  Marland Kitchen glucose blood (TRUE METRIX BLOOD GLUCOSE TEST) test strip Check BS daily Dx E11.9  . hydrOXYzine (ATARAX/VISTARIL) 10 MG tablet Take 1 tablet (10 mg total) by mouth 3 (three) times  daily as needed for anxiety.  Marland Kitchen ketoconazole (NIZORAL) 2 % cream   . lisinopril (ZESTRIL) 10 MG tablet Take 1 tablet (10 mg total) by mouth daily.  Marland Kitchen lovastatin (MEVACOR) 20 MG tablet Take 1 tablet (20 mg total) by mouth at bedtime.  . metFORMIN (GLUCOPHAGE) 1000 MG tablet Take 1 tablet (1,000 mg total) by mouth 2 (two) times daily with a meal.  . oxyCODONE-acetaminophen (PERCOCET) 10-325 MG tablet Take 1 tablet by mouth every 6 (six) hours as needed for pain.  . TRUEplus Lancets 30G MISC Check BS daily Dx E11.9  . [DISCONTINUED] albuterol (PROVENTIL HFA;VENTOLIN HFA) 108 (90 Base) MCG/ACT inhaler Inhale 2 puffs into the lungs every 6 (six) hours as needed for wheezing or shortness of breath.   No facility-administered encounter medications on file as of 12/09/2019.     BP Readings from Last 3 Encounters:  08/20/19 126/60  05/30/19 124/80  05/29/19 124/80   Lab Results  Component Value Date   HGBA1C 6.6 (H) 10/29/2019   HGBA1C 7.2 (H) 02/28/2019   HGBA1C 6.8 01/17/2018   Lab Results  Component Value Date   MICROALBUR 1.1 06/27/2014   LDLCALC 76 10/29/2019   CREATININE 0.63 10/29/2019      RN Care Plan: Goals Addressed            This Visit's Progress   . Chronic Disease Management Needs       CARE PLAN ENTRY (see longtitudinal plan of care for additional care plan information)  Current Barriers:  . Chronic Disease Management support, education, and care coordination needs related to Depression, Obesity,  DM, HTN, COPD, DJD, HLD  Clinical Goal(s) related to Depression, Obesity, DM, HTN, COPD, DJD, HLD:  Over the next Depression, Obesity, DM, HTN, COPD, DJD, HLD days, patient will:  . Work with the care management team to address educational, disease management, and care coordination needs  . Begin or continue self health monitoring activities as directed today Measure and record cbg (blood glucose) 1 times daily and Measure and record blood pressure 3 times per  week . Call provider office for new or worsened signs and symptoms Blood glucose findings outside established parameters and Blood pressure findings outside established parameters . Call care management team with questions or concerns . Verbalize basic understanding of patient centered plan of care established today  Interventions related to Depression, Obesity, DM, HTN, COPD, DJD, HLD:  . Evaluation of current treatment plans and patient's adherence to plan as established by provider . Assessed patient understanding of disease states . Assessed patient's education and care coordination needs . Provided disease specific education to patient  . Collaborated with appropriate clinical care team members regarding patient needs . Chart reviewed including recent office notes, telephone notes, referrals, and lab results . Discussed transportation concerns o Provided information on RCATS . Discussed ambulation with walker and ability to perform ADLs . Reviewed and discussed medications . Discussed used of bedside commode . Advised that she needs a visit with Dr Lajuana Ripple. Will have front office staff arrange this . Reviewed and discussed most recent recommendations from pulmonologist, Dr Verlee Monte . Discussed home health and in-home care needs . Discussed use of oxygen. Has portable tanks for traveling outside of the home.  . Encouraged patient to reach out to CCM team as needed  Patient Self Care Activities related to Depression, Obesity, DM, HTN, COPD, DJD, HLD:  . Patient is unable to independently self-manage chronic health conditions  Initial goal documentation        Plan:   The care management team will reach out to the patient again over the next 2 days.    [name], BSN, RN-BC Embedded Chronic Care Manager [facility] [place] [facility] Direct Dial: [phone]

## 2019-12-11 ENCOUNTER — Ambulatory Visit: Payer: Medicare PPO | Admitting: *Deleted

## 2019-12-11 DIAGNOSIS — J449 Chronic obstructive pulmonary disease, unspecified: Secondary | ICD-10-CM

## 2019-12-11 DIAGNOSIS — E1169 Type 2 diabetes mellitus with other specified complication: Secondary | ICD-10-CM | POA: Diagnosis not present

## 2019-12-11 DIAGNOSIS — E785 Hyperlipidemia, unspecified: Secondary | ICD-10-CM | POA: Diagnosis not present

## 2019-12-11 DIAGNOSIS — F339 Major depressive disorder, recurrent, unspecified: Secondary | ICD-10-CM | POA: Diagnosis not present

## 2019-12-11 DIAGNOSIS — Z7409 Other reduced mobility: Secondary | ICD-10-CM

## 2019-12-11 DIAGNOSIS — Z789 Other specified health status: Secondary | ICD-10-CM

## 2019-12-11 DIAGNOSIS — I10 Essential (primary) hypertension: Secondary | ICD-10-CM | POA: Diagnosis not present

## 2019-12-11 DIAGNOSIS — E1159 Type 2 diabetes mellitus with other circulatory complications: Secondary | ICD-10-CM | POA: Diagnosis not present

## 2019-12-12 NOTE — Chronic Care Management (AMB) (Signed)
  Chronic Care Management   Follow-up Note  12/12/2019 Name: Savannah Burns MRN: [id number] DOB: May 22, 1950  Quick check in with patient to verify that she was able to get in and out of her granddaughter's car and that she will be able to assist her with transportation to future appointments. We again discussed RCATS and she plans to reach out to the CCM team if transportation assistance is needed in the future.   Follow up plan: The care management team will reach out to the patient again over the next 30 days.   [name], BSN, RN-BC Embedded Chronic Care Manager [facility] [place] [facility] Direct Dial: [phone]

## 2019-12-12 NOTE — Patient Instructions (Signed)
Reach out to [facility] team for assistance with transportation if needed.   Patient verbalizes understanding of instructions provided today.   Follow-up Plan The care management team will reach out to the patient again over the next 30 days.   [name], BSN, RN-BC Embedded Chronic Care Manager [facility] / [place] [facility] Direct Dial: [phone]

## 2019-12-18 ENCOUNTER — Ambulatory Visit: Payer: Medicare PPO | Admitting: Licensed Clinical Social Worker

## 2019-12-18 DIAGNOSIS — E1159 Type 2 diabetes mellitus with other circulatory complications: Secondary | ICD-10-CM

## 2019-12-18 DIAGNOSIS — F339 Major depressive disorder, recurrent, unspecified: Secondary | ICD-10-CM

## 2019-12-18 DIAGNOSIS — J449 Chronic obstructive pulmonary disease, unspecified: Secondary | ICD-10-CM

## 2019-12-18 DIAGNOSIS — E785 Hyperlipidemia, unspecified: Secondary | ICD-10-CM

## 2019-12-18 DIAGNOSIS — I152 Hypertension secondary to endocrine disorders: Secondary | ICD-10-CM

## 2019-12-18 DIAGNOSIS — E1169 Type 2 diabetes mellitus with other specified complication: Secondary | ICD-10-CM

## 2019-12-18 NOTE — Patient Instructions (Addendum)
Licensed Clinical Education officer, museum Visit Information  Goals we discussed today:     Client will talk with LCSW in next 30 days to discuss depression issues of client and manaement of depression issues (pt-stated)        CARE PLAN ENTRY   Current Barriers:  Patient with chronic diagnoses of Depression, Obesity, DM, HTN, COPD, DJD, HLD  Clinical Social Work Clinical Goal(s):   LCSW will call client in next 30 days to discuss depression issues of client and client management of depression issues  Interventions:  Talked with clinet about CCM program support  Talked with client about needs of client  Talked with client about mobility of client  Talked with client about ADLs completion of client (she said she cannot step anymore into bathtub)  Talked with client about meal provision for client  Talked with client about DME (has a walker with a seat, 3 in 1 bedside commode,oxygen system)  Talked with client about breathing issues for patient  Talked with client about [place] support  Talked with [name] about financial needs (she is on a set income each month and said she has some financial challenges)  Talked with client about mood of client  Talked with client about depression issue of client  Talked with client about mobility in home setting (she said her daughter is helping her to rearrange furniture in the home to help client with mobility)  Talked with client about sleeping issues of client  Talked with client about edema issues  Talked with client about pain issues of client  Talked with client about social support network (daughter is supportive, grandchildren are supportive but are in school currently)  Talked with client about her upcoming medical appointments  Provided counseling support for client  Talked with client about Emergency planning/management officer transport service (LCSW gave client phone number for [place])  Talked with client about [facility] Triage Nurse  as a resource for client  Talked with client about Dr. Lottie Dawson, [name] pharmacist as a resource for client  Patient Self Care Activities:   Eats meals with set up assistance   Patient Self Care Deficits:  Shortness of breath (breathing challenges, uses oxygen as needed) Mobility issues Transport challenges  Initial goal documentation      Follow Up Plan:LCSW to call client in next 4 weeks to talk with her about depression issues of client and to talk with client about her management of depression issues faced  Materials Provided: No  The patient verbalized understanding of instructions provided today and declined a print copy of patient instruction materials.   [name].Jadia Capers MSW, LCSW Licensed Clinical Social Worker [facility] [phone]

## 2019-12-18 NOTE — Chronic Care Management (AMB) (Signed)
Chronic Care Management    Clinical Social Work Follow Up Note  12/18/2019 Name: Savannah Burns MRN: [id number] DOB: [birth year]  Savannah Burns is a 69 y.o. year old female who is a primary care patient of [name], DO. The CCM team was consulted for assistance with [employer] .   Review of patient status, including review of consultants reports, other relevant assessments, and collaboration with appropriate care team members and the patient's provider was performed as part of comprehensive patient evaluation and provision of chronic care management services.    SDOH (Social Determinants of Health) assessments performed: No;risk for tobacco use; risk for depression; risk for financial strain; risk for physical inactivity; risk for transport needs    Chronic Care Management from 12/05/2019 in [place]  PHQ-9 Total Score 6        GAD 7 : Generalized Anxiety Score 12/05/2019 12/22/2017  Nervous, Anxious, on Edge 1 1  Control/stop worrying 1 3  Worry too much - different things 1 1  Trouble relaxing 0 1  Restless 0 0  Easily annoyed or irritable 1 1  Afraid - awful might happen 0 3  Total GAD 7 Score 4 10  Anxiety Difficulty Somewhat difficult Somewhat difficult    Outpatient Encounter Medications as of 12/18/2019  Medication Sig  . aspirin 81 MG tablet Take 81 mg by mouth daily. Reported on 06/01/2015  . Blood Glucose Calibration (GLUCOMETER DEX HIGH CONTROL) LIQD Test CBG's once daily.   . Blood Glucose Monitoring Suppl (TRUE METRIX AIR GLUCOSE METER) w/Device KIT Check BS daily Dx E11.9  . escitalopram (LEXAPRO) 20 MG tablet Take 1 tablet (20 mg total) by mouth daily.  . furosemide (LASIX) 20 MG tablet Take 2 tablets (40 mg total) by mouth 2 (two) times daily.  Marland Kitchen glucose blood (TRUE METRIX BLOOD GLUCOSE TEST) test strip Check BS daily Dx E11.9  . hydrOXYzine (ATARAX/VISTARIL) 10 MG tablet Take 1 tablet (10 mg total) by mouth 3 (three)  times daily as needed for anxiety.  Marland Kitchen ketoconazole (NIZORAL) 2 % cream   . lisinopril (ZESTRIL) 10 MG tablet Take 1 tablet (10 mg total) by mouth daily.  Marland Kitchen lovastatin (MEVACOR) 20 MG tablet Take 1 tablet (20 mg total) by mouth at bedtime.  . metFORMIN (GLUCOPHAGE) 1000 MG tablet Take 1 tablet (1,000 mg total) by mouth 2 (two) times daily with a meal.  . oxyCODONE-acetaminophen (PERCOCET) 10-325 MG tablet Take 1 tablet by mouth every 6 (six) hours as needed for pain.  . TRUEplus Lancets 30G MISC Check BS daily Dx E11.9  . [DISCONTINUED] albuterol (PROVENTIL HFA;VENTOLIN HFA) 108 (90 Base) MCG/ACT inhaler Inhale 2 puffs into the lungs every 6 (six) hours as needed for wheezing or shortness of breath.   No facility-administered encounter medications on file as of 12/18/2019.    Goals    .  Client will talk with LCSW in next 30 days to discuss depression issues of client and manaement of depression issues (pt-stated)      CARE PLAN ENTRY   Current Barriers:  Patient with chronic diagnoses of Depression, Obesity, DM, HTN, COPD, DJD, HLD  Clinical Social Work Clinical Goal(s):  Marland Kitchen LCSW will call client in next 30 days to discuss depression issues of client and client management of depression issues  Interventions: . Talked with clinet about CCM program support . Talked with client about needs of client . Talked with client about mobility of client . Talked with client about  ADLs completion of client (she said she cannot step anymore into bathtub) . Talked with client about meal provision for client . Talked with client about DME (has a walker with a seat, 3 in 1 bedside commode,oxygen system) . Talked with client about breathing issues for patient . Talked with client about [employer] support . Talked with [name] about financial needs (she is on a set income each month and said she has some financial challenges) . Talked with client about mood of client . Talked with client about depression  issue of client . Talked with client about mobility in home setting (she said her daughter is helping her to rearrange furniture in the home to help client with mobility) . Talked with client about sleeping issues of client . Talked with client about edema issues . Talked with client about pain issues of client . Talked with client about social support network (daughter is supportive, grandchildren are supportive but are in school currently) . Talked with client about her upcoming medical appointments . Provided counseling support for client . Talked with client about Emergency planning/management officer transport service (LCSW gave client phone number for [place]) . Talked with client about [facility] Triage Nurse as a resource for client . Talked with client about Dr. Lottie Dawson, [facility] pharmacist as a resource for client  Patient Self Care Activities:   Eats meals with set up assistance   Patient Self Care Deficits:  Shortness of breath (breathing challenges, uses oxygen as needed) Mobility issues Transport challenges  Initial goal documentation      Follow Up Plan: LCSW to call client in next 4 weeks to talk with her about depression issues of client and to talk with client about her management of depression issues faced  [name].Kamron Portee MSW, LCSW Licensed Clinical Social Worker [facility] [phone]

## 2019-12-25 DIAGNOSIS — J449 Chronic obstructive pulmonary disease, unspecified: Secondary | ICD-10-CM | POA: Diagnosis not present

## 2020-01-07 ENCOUNTER — Ambulatory Visit (INDEPENDENT_AMBULATORY_CARE_PROVIDER_SITE_OTHER): Payer: Medicare PPO | Admitting: Licensed Clinical Social Worker

## 2020-01-07 DIAGNOSIS — I152 Hypertension secondary to endocrine disorders: Secondary | ICD-10-CM

## 2020-01-07 DIAGNOSIS — F339 Major depressive disorder, recurrent, unspecified: Secondary | ICD-10-CM | POA: Diagnosis not present

## 2020-01-07 DIAGNOSIS — E1159 Type 2 diabetes mellitus with other circulatory complications: Secondary | ICD-10-CM

## 2020-01-07 DIAGNOSIS — J449 Chronic obstructive pulmonary disease, unspecified: Secondary | ICD-10-CM

## 2020-01-07 DIAGNOSIS — M5136 Other intervertebral disc degeneration, lumbar region: Secondary | ICD-10-CM | POA: Diagnosis not present

## 2020-01-07 DIAGNOSIS — E119 Type 2 diabetes mellitus without complications: Secondary | ICD-10-CM

## 2020-01-07 DIAGNOSIS — E785 Hyperlipidemia, unspecified: Secondary | ICD-10-CM | POA: Diagnosis not present

## 2020-01-07 DIAGNOSIS — Z9189 Other specified personal risk factors, not elsewhere classified: Secondary | ICD-10-CM | POA: Diagnosis not present

## 2020-01-07 DIAGNOSIS — Z79899 Other long term (current) drug therapy: Secondary | ICD-10-CM | POA: Diagnosis not present

## 2020-01-07 DIAGNOSIS — G894 Chronic pain syndrome: Secondary | ICD-10-CM | POA: Diagnosis not present

## 2020-01-07 DIAGNOSIS — E1169 Type 2 diabetes mellitus with other specified complication: Secondary | ICD-10-CM

## 2020-01-07 NOTE — Chronic Care Management (AMB) (Signed)
Chronic Care Management    Clinical Social Work Follow Up Note  01/07/2020 Name: Savannah Burns MRN: [id number] DOB: Oct 26, 1950  Savannah Burns is a 69 y.o. year old female who is a primary care patient of Savannah Norlander, DO. The CCM team was consulted for assistance with Savannah Burns .   Review of patient status, including review of consultants reports, other relevant assessments, and collaboration with appropriate care team members and the patient's provider was performed as part of comprehensive patient evaluation and provision of chronic care management services.    SDOH (Social Determinants of Health) assessments performed: No; risk for tobacco use; risk for depression; risk for financial strain; risk for physical inactivity    Chronic Care Management from 12/05/2019 in Savannah Burns  PHQ-9 Total Score 6       GAD 7 : Generalized Anxiety Score 12/05/2019 12/22/2017  Nervous, Anxious, on Edge 1 1  Control/stop worrying 1 3  Worry too much - different things 1 1  Trouble relaxing 0 1  Restless 0 0  Easily annoyed or irritable 1 1  Afraid - awful might happen 0 3  Total GAD 7 Score 4 10  Anxiety Difficulty Somewhat difficult Somewhat difficult    Outpatient Encounter Medications as of 01/07/2020  Medication Sig  . aspirin 81 MG tablet Take 81 mg by mouth daily. Reported on 06/01/2015  . Blood Glucose Calibration (GLUCOMETER DEX HIGH CONTROL) LIQD Test CBG's once daily.   . Blood Glucose Monitoring Suppl (TRUE METRIX AIR GLUCOSE METER) w/Device KIT Check BS daily Dx E11.9  . escitalopram (LEXAPRO) 20 MG tablet Take 1 tablet (20 mg total) by mouth daily.  . furosemide (LASIX) 20 MG tablet Take 2 tablets (40 mg total) by mouth 2 (two) times daily.  Marland Kitchen glucose blood (TRUE METRIX BLOOD GLUCOSE TEST) test strip Check BS daily Dx E11.9  . hydrOXYzine (ATARAX/VISTARIL) 10 MG tablet Take 1 tablet (10 mg total) by mouth 3 (three) times daily as needed  for anxiety.  Marland Kitchen ketoconazole (NIZORAL) 2 % cream   . lisinopril (ZESTRIL) 10 MG tablet Take 1 tablet (10 mg total) by mouth daily.  Marland Kitchen lovastatin (MEVACOR) 20 MG tablet Take 1 tablet (20 mg total) by mouth at bedtime.  . metFORMIN (GLUCOPHAGE) 1000 MG tablet Take 1 tablet (1,000 mg total) by mouth 2 (two) times daily with a meal.  . oxyCODONE-acetaminophen (PERCOCET) 10-325 MG tablet Take 1 tablet by mouth every 6 (six) hours as needed for pain.  . TRUEplus Lancets 30G MISC Check BS daily Dx E11.9  . [DISCONTINUED] albuterol (PROVENTIL HFA;VENTOLIN HFA) 108 (90 Base) MCG/ACT inhaler Inhale 2 puffs into the lungs every 6 (six) hours as needed for wheezing or shortness of breath.   No facility-administered encounter medications on file as of 01/07/2020.    Goals    .  Client will talk with LCSW in next 30 days to discuss depression issues of client and manaement of depression issues (pt-stated)      CARE PLAN ENTRY   Current Barriers:  Patient with chronic diagnoses of Depression, Obesity, DM, HTN, COPD, DJD, HLD  Clinical Social Work Clinical Goal(s):  Marland Kitchen LCSW will call client in next 30 days to discuss depression issues of client and client management of depression issues  Interventions: . Talked with clinet about CCM program support . Talked with client about needs of client . Talked with client about mobility of client . Talked with client about ADLs completion of client (  she said she cannot step anymore into bathtub) . Talked with client about meal provision for client . Talked with client about DME (has a walker with a seat, 3 in 1 bedside commode,oxygen system) . Talked with client about breathing issues and oxygen level check for patient . Talked with client about [employer] support . Talked with Savannah Burns about financial needs (she is on a set income each month and said she has some financial challenges) . Talked with client about mood of client . Talked with client about depression  issue of client . Talked with client about mobility in home setting (she said her daughter is helping her to rearrange furniture in the home to help client with mobility) . Talked with client about sleeping issues of client . Talked with client about edema issues . Talked with client about social support network (daughter is supportive, grandchildren are supportive but are in school currently) . Talked with client about her upcoming medical appointments . Talked with client about Emergency planning/management officer transport service (LCSW gave client phone number for Savannah Burns) . Talked with Savannah Burns about pain issues in her feet  . Talked with Savannah Burns about food stamps application . Talked with Savannah Burns about ADL care of client . Provided counseling support for client . Collaborated with LPN at Savannah Burns regarding client needs . Talked with Savannah Burns about her oxygen use (she said she uses oxygen continuously via nasal canula at 5 liters)  Patient Self Care Activities:   Eats meals with set up assistance   Patient Self Care Deficits:  Shortness of breath (breathing challenges, uses oxygen as needed) Mobility issues Transport challenges  Initial goal documentation     Follow Up Plan: LCSW to call client in next 4 weeks to talk with her about depression issues of client and to talk with client about her management of depression issues faced  Savannah Burns MSW, LCSW Licensed Clinical Social Worker [facility] [phone]

## 2020-01-07 NOTE — Patient Instructions (Addendum)
Licensed Clinical Education officer, museum Visit Information  Goals we discussed today:   Client will talk with LCSW in next 30 days to discuss depression issues of client and manaement of depression issues (pt-stated)         CARE PLAN ENTRY   Current Barriers:  Patient with chronic diagnoses of Depression, Obesity, DM, HTN, COPD, DJD, HLD  Clinical Social Work Clinical Goal(s):   LCSW will call client in next 30 days to discuss depression issues of client and client management of depression issues  Interventions:  Talked with clinet about CCM program support  Talked with client about needs of client  Talked with client about mobility of client  Talked with client about ADLs completion of client (she said she cannot step anymore into bathtub)  Talked with client about meal provision for client  Talked with client about DME (has a walker with a seat, 3 in 1 bedside commode,oxygen system)  Talked with client about breathing issues and oxygen level check for patient  Talked with client about [place] support  Talked with [name] about financial needs (she is on a set income each month and said she has some financial challenges)  Talked with client about mood of client  Talked with client about depression issue of client  Talked with client about mobility in home setting (she said her daughter is helping her to rearrange furniture in the home to help client with mobility)  Talked with client about sleeping issues of client  Talked with client about edema issues  Talked with client about social support network (daughter is supportive, grandchildren are supportive but are in school currently)  Talked with client about her upcoming medical appointments  Talked with client about Emergency planning/management officer transport service (LCSW gave client phone number for [facility])  Talked with [name] about pain issues in her feet   Talked with [name] about food stamps application  Talked  with [name] about ADL care of client  Provided counseling support for client  Collaborated with LPN at [facility] regarding client needs  Talked with [name] about her oxygen use (she said she uses oxygen continuously via nasal canula at 5 liters)  Patient Self Care Activities:   Eats meals with set up assistance   Patient Self Care Deficits:  Shortness of breath (breathing challenges, uses oxygen as needed) Mobility issues Transport challenges  Initial goal documentation     Follow Up Plan: LCSW to call client in next 4 weeks to talk with her about depression issues of client and to talk with client about her management of depression issues faced  Materials Provided: No  The patient verbalized understanding of instructions provided today and declined a print copy of patient instruction materials.   [name].Dyna Figuereo MSW, LCSW Licensed Clinical Social Worker [facility] [phone]

## 2020-01-08 ENCOUNTER — Encounter: Payer: Self-pay | Admitting: Family Medicine

## 2020-01-08 ENCOUNTER — Ambulatory Visit (INDEPENDENT_AMBULATORY_CARE_PROVIDER_SITE_OTHER): Payer: Medicare PPO | Admitting: Family Medicine

## 2020-01-08 DIAGNOSIS — I5032 Chronic diastolic (congestive) heart failure: Secondary | ICD-10-CM

## 2020-01-08 DIAGNOSIS — M7989 Other specified soft tissue disorders: Secondary | ICD-10-CM

## 2020-01-08 NOTE — Progress Notes (Signed)
Virtual Visit via Telephone Note  I connected with [name] on 01/08/20 at 11:05 AM by telephone and verified that I am speaking with the correct person using two identifiers. Savannah Burns is currently located at home and her granddaughter is currently with her during this visit. The provider, Loman Brooklyn, FNP is located in their office at time of visit.  I discussed the limitations, risks, security and privacy concerns of performing an evaluation and management service by telephone and the availability of in person appointments. I also discussed with the patient that there may be a patient responsible charge related to this service. The patient expressed understanding and agreed to proceed.  Subjective: PCP: [name], DO  Chief Complaint  Patient presents with  . Edema   Patient scheduled appointment today due to concerns with lower extremity edema. She has a history of diastolic heart failure and states furosemide just doesn't work for her like it use to because she has been on it for a long time. She reports the bottoms of her feet feel like painful pillows, the tops of her feet look like balloons and she is just generally swollen from the knees down. She endorses pain due to the swelling. She does eat a low salt diet and elevate her extremities when she is sitting. She does not weigh herself at home. She is not wearing compression hose. She reports she was supposed to get a prescription for them a few months ago but never did. Patient admits that she went through a period where she did not care if she lived or died so she quit taking all of her medications x6 months. She has since resumed her furosemide 3-4 months ago. She states she could not tell a different in swelling from when she wasn't taking the medication to when she resumed it. She did resume 40 mg BID which is how the medication is ordered. She also increased to 60 mg BID x1 week during which she still  could not tell a difference in her swelling.    ROS: Per HPI  Current Outpatient Medications:  .  aspirin 81 MG tablet, Take 81 mg by mouth daily. Reported on 06/01/2015, Disp: , Rfl:  .  Blood Glucose Calibration (GLUCOMETER DEX HIGH CONTROL) LIQD, Test CBG's once daily. , Disp: , Rfl:  .  Blood Glucose Monitoring Suppl (TRUE METRIX AIR GLUCOSE METER) w/Device KIT, Check BS daily Dx E11.9, Disp: 1 kit, Rfl: 0 .  escitalopram (LEXAPRO) 20 MG tablet, Take 1 tablet (20 mg total) by mouth daily., Disp: 90 tablet, Rfl: 0 .  furosemide (LASIX) 20 MG tablet, Take 2 tablets (40 mg total) by mouth 2 (two) times daily., Disp: 360 tablet, Rfl: 0 .  glucose blood (TRUE METRIX BLOOD GLUCOSE TEST) test strip, Check BS daily Dx E11.9, Disp: 100 each, Rfl: 3 .  hydrOXYzine (ATARAX/VISTARIL) 10 MG tablet, Take 1 tablet (10 mg total) by mouth 3 (three) times daily as needed for anxiety., Disp: 60 tablet, Rfl: 2 .  ketoconazole (NIZORAL) 2 % cream, , Disp: , Rfl:  .  lisinopril (ZESTRIL) 10 MG tablet, Take 1 tablet (10 mg total) by mouth daily., Disp: 90 tablet, Rfl: 0 .  lovastatin (MEVACOR) 20 MG tablet, Take 1 tablet (20 mg total) by mouth at bedtime., Disp: 90 tablet, Rfl: 3 .  metFORMIN (GLUCOPHAGE) 1000 MG tablet, Take 1 tablet (1,000 mg total) by mouth 2 (two) times daily with a meal., Disp: 180 tablet,  Rfl: 3 .  oxyCODONE-acetaminophen (PERCOCET) 10-325 MG tablet, Take 1 tablet by mouth every 6 (six) hours as needed for pain., Disp: , Rfl:  .  TRUEplus Lancets 30G MISC, Check BS daily Dx E11.9, Disp: 100 each, Rfl: 3  Allergies  Allergen Reactions  . Keflex [Cephalexin] Nausea And Vomiting   Past Medical History:  Diagnosis Date  . Adrenal adenoma, left    Stable  . Anxiety   . Arthritis    bilateral hands  . CHF (congestive heart failure) ([place])   . COPD (chronic obstructive pulmonary disease) ([place])   . Depression   . Diabetes mellitus without complication ([place])   . Dyspnea   . Grade II  diastolic dysfunction   . History of kidney stones   . Hyperlipidemia   . Hypertension   . Lower back pain   . Panic attacks   . Pneumonia    currently taking antibiotic and prednisone for early stages of pneumonia  . Pulmonary nodules    bilateral  . Skin cancer    face    Observations/Objective: A&O  No respiratory distress or wheezing audible over the phone Mood, judgement, and thought processes all WNL   Assessment and Plan: 1-2. Chronic diastolic HF (heart failure) (HCC)/Swelling of lower extremity - Encouraged application of compression hose daily. New order faxed over to [facility] here in [place]. Continue elevating lower extremities and eating a low salt diet. Recommended daily weights and scheduling a follow-up appointment with her cardiologist to discuss a possible change in medication. Patient verbalized understanding.  - For home use only DME Other see comment   Follow Up Instructions:  I discussed the assessment and treatment plan with the patient. The patient was provided an opportunity to ask questions and all were answered. The patient agreed with the plan and demonstrated an understanding of the instructions.   The patient was advised to call back or seek an in-person evaluation if the symptoms worsen or if the condition fails to improve as anticipated.  The above assessment and management plan was discussed with the patient. The patient verbalized understanding of and has agreed to the management plan. Patient is aware to call the clinic if symptoms persist or worsen. Patient is aware when to return to the clinic for a follow-up visit. Patient educated on when it is appropriate to go to the emergency department.   Time call ended: 11:13 AM  I provided 10 minutes of non-face-to-face time during this encounter.  Hendricks Limes, MSN, APRN, FNP-C [facility] 01/08/20

## 2020-01-21 ENCOUNTER — Telehealth: Payer: Self-pay | Admitting: Family Medicine

## 2020-01-21 NOTE — Telephone Encounter (Signed)
Pt called about compression stockings, was supposed to be called in and said that the pharm said they have not seen anything for it

## 2020-01-21 NOTE — Progress Notes (Signed)
Cardiology Office Note    Date:  01/22/2020   ID:  Savannah Burns, DOB [birth year], MRN [id number]  PCP:  [name], DO  Cardiologist: Carlyle Dolly, MD EPS: None  Chief Complaint  Patient presents with  . Leg Swelling    History of Present Illness:  Savannah Burns is a 69 y.o. female history of chronic diastolic CHF, COPD on home O2, HTN, HLD, and Type 2 DM, mild AS with normal LVEF and G1DD on echo 05/07/19.  Patient saw PCP 01/08/2020 with lower extremity edema. She was told to wear compression stockings but she never got them. She had stopped taking all of her medications for 6 months but resumed furosemide 68monthago but no other meds. She increased it to 60 mg twice daily for 1 week but did not help. Not urinating much and doesn't think it's working. Up 15 lbs. Not taking lisinopril or lovastatin. Watches her salt. Not eating out or     Past Medical History:  Diagnosis Date  . Adrenal adenoma, left    Stable  . Anxiety   . Arthritis    bilateral hands  . CHF (congestive heart failure) ([facility]   . COPD (chronic obstructive pulmonary disease) ([facility]   . Depression   . Diabetes mellitus without complication ([facility]   . Dyspnea   . Grade II diastolic dysfunction   . History of kidney stones   . Hyperlipidemia   . Hypertension   . Lower back pain   . Panic attacks   . Pneumonia    currently taking antibiotic and prednisone for early stages of pneumonia  . Pulmonary nodules    bilateral  . Skin cancer    face    Past Surgical History:  Procedure Laterality Date  . Breast Cystectomy  Right   . CESAREAN SECTION    . CYSTOSCOPY/URETEROSCOPY/HOLMIUM LASER/STENT PLACEMENT Bilateral 03/01/2019   Procedure: CYSTOSCOPY/RETROGRADEURETEROSCOPY/HOLMIUM LASER/STENT PLACEMENT;  Surgeon: WCeasar Mons MD;  Location: [facility];  Service: Urology;  Laterality: Bilateral;  ONLY NEEDS 60 MIN  . SKIN CANCER EXCISION     Face  . SPINE SURGERY       Current Medications: Current Meds  Medication Sig  . aspirin 81 MG tablet Take 81 mg by mouth daily. Reported on 06/01/2015  . Blood Glucose Calibration (GLUCOMETER DEX HIGH CONTROL) LIQD Test CBG's once daily.   . Blood Glucose Monitoring Suppl (TRUE METRIX AIR GLUCOSE METER) w/Device KIT Check BS daily Dx E11.9  . glucose blood (TRUE METRIX BLOOD GLUCOSE TEST) test strip Check BS daily Dx E11.9  . oxyCODONE-acetaminophen (PERCOCET) 10-325 MG tablet Take 1 tablet by mouth every 6 (six) hours as needed for pain.  . TRUEplus Lancets 30G MISC Check BS daily Dx E11.9  . [DISCONTINUED] furosemide (LASIX) 20 MG tablet Take 2 tablets (40 mg total) by mouth 2 (two) times daily.     Allergies:   Keflex [cephalexin]   Social History   Socioeconomic History  . Marital status: Widowed    Spouse name: Not on file  . Number of children: 2  . Years of education: 157 . Highest education level: Not on file  Occupational History  . Not on file  Tobacco Use  . Smoking status: Former Smoker    Packs/day: 1.50    Years: 40.00    Pack years: 60.00    Types: Cigarettes    Quit date: 04/29/2015    Years since quitting: 4.7  . Smokeless tobacco: Former  User    Quit date: 04/29/2015  . Tobacco comment: Quit smoking 04/2015- Previous 1.5 ppd smoker  Vaping Use  . Vaping Use: Never used  Substance and Sexual Activity  . Alcohol use: No    Alcohol/week: 0.0 standard drinks  . Drug use: No  . Sexual activity: Not Currently    Birth control/protection: Post-menopausal  Other Topics Concern  . Not on file  Social History Narrative  . Not on file   Social Determinants of Health   Financial Resource Strain: Low Risk   . Difficulty of Paying Living Expenses: Not very hard  Food Insecurity:   . Worried About Charity fundraiser in the Last Year: Not on file  . Ran Out of Food in the Last Year: Not on file  Transportation Needs: No Transportation Needs  . Lack of Transportation (Medical): No  .  Lack of Transportation (Non-Medical): No  Physical Activity: Inactive  . Days of Exercise per Week: 0 days  . Minutes of Exercise per Session: 0 min  Stress:   . Feeling of Stress : Not on file  Social Connections:   . Frequency of Communication with Friends and Family: Not on file  . Frequency of Social Gatherings with Friends and Family: Not on file  . Attends Religious Services: Not on file  . Active Member of Clubs or Organizations: Not on file  . Attends Archivist Meetings: Not on file  . Marital Status: Not on file     Family History:  The patient's family history includes COPD in her sister; Cancer (age of onset: 33) in her paternal grandmother; Diabetes in her father; Heart disease (age of onset: 62) in her father; Hypertension in her father.   ROS:   Please see the history of present illness.    ROS All other systems reviewed and are negative.   PHYSICAL EXAM:   VS:  BP (!) 122/58   Pulse 99   Ht 5' 2" (1.575 m)   Wt (!) 308 lb 14.4 oz (140.1 kg)   BMI 56.50 kg/m   Physical Exam  GEN: Obese, in no acute distress  Neck: Increased JVD, carotid bruits, or masses Cardiac:RRR; 2/6 systolic murmur the left sternal border Respiratory: Decreased breath sounds at the bases with fine crackles GI: soft, nontender, nondistended, + BS Ext: +3-4 edema up past her knees bilaterally without cyanosis, clubbing,  Good distal pulses bilaterally Neuro:  Alert and Oriented x 3 Psych: euthymic mood, full affect  Wt Readings from Last 3 Encounters:  01/22/20 (!) 308 lb 14.4 oz (140.1 kg)  08/20/19 (!) 308 lb 12.8 oz (140.1 kg)  05/30/19 300 lb (136.1 kg)      Studies/Labs Reviewed:   EKG:  EKG is not ordered today.   Recent Labs: 05/29/2019: Hemoglobin 11.9; Platelets 117.0; Pro B Natriuretic peptide (BNP) 58.0 10/29/2019: ALT 11; BUN 12; Creatinine, Ser 0.63; Potassium 4.1; Sodium 138; TSH 2.090   Lipid Panel    Component Value Date/Time   CHOL 122 10/29/2019 1644    TRIG 70 10/29/2019 1644   HDL 31 (L) 10/29/2019 1644   CHOLHDL 3.9 10/29/2019 1644   CHOLHDL 4.9 06/27/2014 0915   VLDL 25 06/27/2014 0915   LDLCALC 76 10/29/2019 1644    Additional studies/ records that were reviewed today include:  Echo 05/07/19 IMPRESSIONS     1. Left ventricular ejection fraction, by estimation, is 70 to 75%. The  left ventricle has hyperdynamic function. The left ventrical has no  regional wall motion abnormalities. There is mildly increased left  ventricular hypertrophy. Left ventricular  diastolic parameters are consistent with Grade I diastolic dysfunction  (impaired relaxation). Elevated left ventricular pressure.   2. Right ventricular systolic function is normal. The right ventricular  size is normal.   3. Left atrial size was mildly dilated.   4. Trivial mitral valve regurgitation.   5. The aortic valve has an indeterminant number of cusps. Aortic valve  regurgitation is not visualized. Mild aortic valve stenosis.   6. The inferior vena cava is normal in size with greater than 50%  respiratory variability, suggesting right atrial pressure of 3 mmHg.      ASSESSMENT:    1. Acute on chronic diastolic CHF (congestive heart failure) ([place])   2. Essential hypertension   3. Hyperlipidemia, unspecified hyperlipidemia type   4. Type 2 diabetes mellitus with other specified complication, without long-term current use of insulin ([place])   5. Aortic valve stenosis, etiology of cardiac valve disease unspecified      PLAN:  In order of problems listed above:  Acute on chronic diastolic CHF stopped meds for about 6 months and when she resumed lasix 1 month ago. it didn't seem to help. She even increased it to 60 mg bid for a week.  Patient is at least 20 pounds overload.  Discussed hospitalization for IV diuresis but she would prefer to try oral medication first.  We will switch her to Demadex 40 mg twice daily K. Dur 20 mEq twice daily.  Check labs today.   Discussed importance of compliance.  HTN blood pressure controlled today but she stopped her lisinopril over 6 months ago and not sure what it runs daily.  HLD was on Mevacor but stopped this as well  DM2 she stopped Metformin.  I asked her to restart this.  Her A1c was up to 6.6 back in August.  I will recheck this today.  Mild Aortic stenosis we will update echo once we get some fluid off.    Medication Adjustments/Labs and Tests Ordered: Current medicines are reviewed at length with the patient today.  Concerns regarding medicines are outlined above.  Medication changes, Labs and Tests ordered today are listed in the Patient Instructions below. Patient Instructions  Medication Instructions:  Your physician has recommended you make the following change in your medication:   STOP: Furosemide START: Torsemide 93m twice daily START: Potassium 28m twice daily START: Metformin 1,00070mwice daily  *If you need a refill on your cardiac medications before your next appointment, please call your pharmacy*   Lab Work: TODAY: BNP, CMET, CBC, A1C  If you have labs (blood work) drawn today and your tests are completely normal, you will receive your results only by: . MMarland KitchenChart Message (if you have MyChart) OR . A paper copy in the mail If you have any lab test that is abnormal or we need to change your treatment, we will call you to review the results.  Follow-Up: [facility] and your [facility] needs are our priority.  As part of our continuing mission to provide you with exceptional heart care, we have created designated Provider Care Teams.  These Care Teams include your primary Cardiologist (physician) and Advanced Practice Providers (APPs -  Physician Assistants and Nurse Practitioners) who all work together to provide you with the care you need, when you need it.  We recommend signing up for the patient portal called "MyChart".  Sign up information is provided on this  After  Visit Summary.  MyChart is used to connect with patients for Virtual Visits (Telemedicine).  Patients are able to view lab/test results, encounter notes, upcoming appointments, etc.  Non-urgent messages can be sent to your provider as well.   To learn more about what you can do with MyChart, go to [email or web address].    Your next appointment:   2 week(s)  The format for your next appointment:   In Person  Provider:   Ermalinda Barrios, PA-C  Other Instructions If you are not feeling any better in 2 days you need to go to the Emergency Dept.     Signed, Ermalinda Barrios, PA-C  01/22/2020 2:55 PM    [facility] [place], [place], [place]  [address] Phone: [phone]; Fax: [phone]

## 2020-01-21 NOTE — Telephone Encounter (Signed)
Blanch Media wrote on 01/08/20 we need a size. Please advise.

## 2020-01-21 NOTE — Telephone Encounter (Signed)
Ive not had to choose a size before.  Typically, the pharmacist has fit the patient for hose in the past.

## 2020-01-22 ENCOUNTER — Encounter (HOSPITAL_COMMUNITY): Payer: Self-pay

## 2020-01-22 ENCOUNTER — Inpatient Hospital Stay (HOSPITAL_COMMUNITY)
Admission: EM | Admit: 2020-01-22 | Discharge: 2020-01-26 | DRG: 393 | Disposition: A | Discharge status: Home Health | Payer: Medicare PPO | Source: Ambulatory Visit | Attending: Internal Medicine | Admitting: Internal Medicine

## 2020-01-22 ENCOUNTER — Other Ambulatory Visit: Payer: Self-pay

## 2020-01-22 ENCOUNTER — Other Ambulatory Visit (HOSPITAL_COMMUNITY)
Admission: RE | Admit: 2020-01-22 | Discharge: 2020-01-22 | Disposition: A | Discharge status: Home / Self Care | Payer: Medicare PPO | Source: Ambulatory Visit | Attending: Physician Assistant | Admitting: Physician Assistant

## 2020-01-22 ENCOUNTER — Telehealth: Payer: Self-pay | Admitting: *Deleted

## 2020-01-22 ENCOUNTER — Other Ambulatory Visit: Payer: Self-pay | Admitting: *Deleted

## 2020-01-22 ENCOUNTER — Emergency Department (HOSPITAL_COMMUNITY): Payer: Medicare PPO

## 2020-01-22 ENCOUNTER — Encounter: Payer: Self-pay | Admitting: Physician Assistant

## 2020-01-22 ENCOUNTER — Ambulatory Visit: Payer: Medicare PPO | Admitting: Physician Assistant

## 2020-01-22 VITALS — BP 122/58 | HR 99 | Ht 62.0 in | Wt 308.9 lb

## 2020-01-22 DIAGNOSIS — R0602 Shortness of breath: Secondary | ICD-10-CM | POA: Diagnosis present

## 2020-01-22 DIAGNOSIS — Z87442 Personal history of urinary calculi: Secondary | ICD-10-CM

## 2020-01-22 DIAGNOSIS — E538 Deficiency of other specified B group vitamins: Secondary | ICD-10-CM | POA: Diagnosis present

## 2020-01-22 DIAGNOSIS — I8510 Secondary esophageal varices without bleeding: Secondary | ICD-10-CM | POA: Diagnosis not present

## 2020-01-22 DIAGNOSIS — D649 Anemia, unspecified: Secondary | ICD-10-CM | POA: Diagnosis present

## 2020-01-22 DIAGNOSIS — Z20822 Contact with and (suspected) exposure to covid-19: Secondary | ICD-10-CM | POA: Diagnosis not present

## 2020-01-22 DIAGNOSIS — K5730 Diverticulosis of large intestine without perforation or abscess without bleeding: Secondary | ICD-10-CM | POA: Diagnosis present

## 2020-01-22 DIAGNOSIS — Z993 Dependence on wheelchair: Secondary | ICD-10-CM

## 2020-01-22 DIAGNOSIS — K644 Residual hemorrhoidal skin tags: Secondary | ICD-10-CM | POA: Diagnosis present

## 2020-01-22 DIAGNOSIS — Z9981 Dependence on supplemental oxygen: Secondary | ICD-10-CM | POA: Diagnosis not present

## 2020-01-22 DIAGNOSIS — I8500 Esophageal varices without bleeding: Secondary | ICD-10-CM | POA: Diagnosis not present

## 2020-01-22 DIAGNOSIS — I350 Nonrheumatic aortic (valve) stenosis: Secondary | ICD-10-CM

## 2020-01-22 DIAGNOSIS — Z800 Family history of malignant neoplasm of digestive organs: Secondary | ICD-10-CM

## 2020-01-22 DIAGNOSIS — E785 Hyperlipidemia, unspecified: Secondary | ICD-10-CM | POA: Diagnosis not present

## 2020-01-22 DIAGNOSIS — D122 Benign neoplasm of ascending colon: Secondary | ICD-10-CM | POA: Diagnosis not present

## 2020-01-22 DIAGNOSIS — J9611 Chronic respiratory failure with hypoxia: Secondary | ICD-10-CM | POA: Diagnosis present

## 2020-01-22 DIAGNOSIS — I5032 Chronic diastolic (congestive) heart failure: Secondary | ICD-10-CM | POA: Diagnosis not present

## 2020-01-22 DIAGNOSIS — K760 Fatty (change of) liver, not elsewhere classified: Secondary | ICD-10-CM | POA: Diagnosis present

## 2020-01-22 DIAGNOSIS — I10 Essential (primary) hypertension: Secondary | ICD-10-CM | POA: Diagnosis present

## 2020-01-22 DIAGNOSIS — K635 Polyp of colon: Principal | ICD-10-CM | POA: Diagnosis present

## 2020-01-22 DIAGNOSIS — E119 Type 2 diabetes mellitus without complications: Secondary | ICD-10-CM | POA: Diagnosis not present

## 2020-01-22 DIAGNOSIS — J449 Chronic obstructive pulmonary disease, unspecified: Secondary | ICD-10-CM | POA: Diagnosis present

## 2020-01-22 DIAGNOSIS — E1169 Type 2 diabetes mellitus with other specified complication: Secondary | ICD-10-CM

## 2020-01-22 DIAGNOSIS — Z7982 Long term (current) use of aspirin: Secondary | ICD-10-CM

## 2020-01-22 DIAGNOSIS — I509 Heart failure, unspecified: Secondary | ICD-10-CM | POA: Diagnosis not present

## 2020-01-22 DIAGNOSIS — D509 Iron deficiency anemia, unspecified: Secondary | ICD-10-CM | POA: Diagnosis not present

## 2020-01-22 DIAGNOSIS — R0609 Other forms of dyspnea: Secondary | ICD-10-CM | POA: Diagnosis present

## 2020-01-22 DIAGNOSIS — Z85828 Personal history of other malignant neoplasm of skin: Secondary | ICD-10-CM

## 2020-01-22 DIAGNOSIS — K648 Other hemorrhoids: Secondary | ICD-10-CM | POA: Diagnosis present

## 2020-01-22 DIAGNOSIS — Z833 Family history of diabetes mellitus: Secondary | ICD-10-CM | POA: Diagnosis not present

## 2020-01-22 DIAGNOSIS — R195 Other fecal abnormalities: Secondary | ICD-10-CM | POA: Diagnosis present

## 2020-01-22 DIAGNOSIS — I110 Hypertensive heart disease with heart failure: Secondary | ICD-10-CM | POA: Diagnosis not present

## 2020-01-22 DIAGNOSIS — Z881 Allergy status to other antibiotic agents status: Secondary | ICD-10-CM

## 2020-01-22 DIAGNOSIS — E876 Hypokalemia: Secondary | ICD-10-CM | POA: Diagnosis present

## 2020-01-22 DIAGNOSIS — Z79899 Other long term (current) drug therapy: Secondary | ICD-10-CM

## 2020-01-22 DIAGNOSIS — Z87891 Personal history of nicotine dependence: Secondary | ICD-10-CM

## 2020-01-22 DIAGNOSIS — Z91138 Patient's unintentional underdosing of medication regimen for other reason: Secondary | ICD-10-CM

## 2020-01-22 DIAGNOSIS — Z7984 Long term (current) use of oral hypoglycemic drugs: Secondary | ICD-10-CM

## 2020-01-22 DIAGNOSIS — T501X6A Underdosing of loop [high-ceiling] diuretics, initial encounter: Secondary | ICD-10-CM | POA: Diagnosis present

## 2020-01-22 DIAGNOSIS — Z6841 Body Mass Index (BMI) 40.0 and over, adult: Secondary | ICD-10-CM | POA: Diagnosis not present

## 2020-01-22 DIAGNOSIS — D123 Benign neoplasm of transverse colon: Secondary | ICD-10-CM | POA: Diagnosis not present

## 2020-01-22 DIAGNOSIS — D62 Acute posthemorrhagic anemia: Secondary | ICD-10-CM | POA: Diagnosis not present

## 2020-01-22 DIAGNOSIS — R600 Localized edema: Secondary | ICD-10-CM

## 2020-01-22 DIAGNOSIS — Z7901 Long term (current) use of anticoagulants: Secondary | ICD-10-CM

## 2020-01-22 DIAGNOSIS — Z825 Family history of asthma and other chronic lower respiratory diseases: Secondary | ICD-10-CM

## 2020-01-22 DIAGNOSIS — Z8249 Family history of ischemic heart disease and other diseases of the circulatory system: Secondary | ICD-10-CM

## 2020-01-22 DIAGNOSIS — R0600 Dyspnea, unspecified: Secondary | ICD-10-CM | POA: Diagnosis not present

## 2020-01-22 DIAGNOSIS — I5033 Acute on chronic diastolic (congestive) heart failure: Secondary | ICD-10-CM

## 2020-01-22 DIAGNOSIS — K7469 Other cirrhosis of liver: Secondary | ICD-10-CM | POA: Diagnosis not present

## 2020-01-22 DIAGNOSIS — K7460 Unspecified cirrhosis of liver: Secondary | ICD-10-CM

## 2020-01-22 DIAGNOSIS — I517 Cardiomegaly: Secondary | ICD-10-CM | POA: Diagnosis not present

## 2020-01-22 DIAGNOSIS — D125 Benign neoplasm of sigmoid colon: Secondary | ICD-10-CM | POA: Diagnosis not present

## 2020-01-22 DIAGNOSIS — K8020 Calculus of gallbladder without cholecystitis without obstruction: Secondary | ICD-10-CM | POA: Diagnosis not present

## 2020-01-22 LAB — COMPREHENSIVE METABOLIC PANEL
ALT: 8 U/L (ref 0–44)
AST: 14 U/L — ABNORMAL LOW (ref 15–41)
Albumin: 2.5 g/dL — ABNORMAL LOW (ref 3.5–5.0)
Alkaline Phosphatase: 64 U/L (ref 38–126)
Anion gap: 6 (ref 5–15)
BUN: 13 mg/dL (ref 8–23)
CO2: 32 mmol/L (ref 22–32)
Calcium: 8.2 mg/dL — ABNORMAL LOW (ref 8.9–10.3)
Chloride: 97 mmol/L — ABNORMAL LOW (ref 98–111)
Creatinine, Ser: 0.61 mg/dL (ref 0.44–1.00)
GFR, Estimated: >60 mL/min (ref >60)
Glucose, Bld: 168 mg/dL — ABNORMAL HIGH (ref 70–99)
Potassium: 3.7 mmol/L (ref 3.5–5.1)
Sodium: 135 mmol/L (ref 135–145)
Total Bilirubin: 0.5 mg/dL (ref 0.3–1.2)
Total Protein: 7.6 g/dL (ref 6.5–8.1)

## 2020-01-22 LAB — CBC WITH DIFFERENTIAL/PLATELET
Abs Immature Granulocytes: 0.05 10*3/uL (ref 0.00–0.07)
Basophils Absolute: 0 10*3/uL (ref 0.0–0.1)
Basophils Relative: 0 %
Eosinophils Absolute: 0.1 10*3/uL (ref 0.0–0.5)
Eosinophils Relative: 1 %
HCT: 15.3 % — ABNORMAL LOW (ref 36.0–46.0)
Hemoglobin: 3.7 g/dL — CL (ref 12.0–15.0)
Immature Granulocytes: 1 %
Lymphocytes Relative: 21 %
Lymphs Abs: 1.9 10*3/uL (ref 0.7–4.0)
MCH: 17.5 pg — ABNORMAL LOW (ref 26.0–34.0)
MCHC: 24.2 g/dL — ABNORMAL LOW (ref 30.0–36.0)
MCV: 72.2 fL — ABNORMAL LOW (ref 80.0–100.0)
Monocytes Absolute: 0.8 10*3/uL (ref 0.1–1.0)
Monocytes Relative: 9 %
Neutro Abs: 6.1 10*3/uL (ref 1.7–7.7)
Neutrophils Relative %: 68 %
Platelets: 184 10*3/uL (ref 150–400)
RBC: 2.12 MIL/uL — ABNORMAL LOW (ref 3.87–5.11)
RDW: 21.5 % — ABNORMAL HIGH (ref 11.5–15.5)
WBC: 9 10*3/uL (ref 4.0–10.5)
nRBC: 0 % (ref 0.0–0.2)

## 2020-01-22 LAB — RETICULOCYTES
Immature Retic Fract: 29.8 % — ABNORMAL HIGH (ref 2.3–15.9)
RBC.: 2.18 MIL/uL — ABNORMAL LOW (ref 3.87–5.11)
Retic Count, Absolute: 79.4 10*3/uL (ref 19.0–186.0)
Retic Ct Pct: 3.6 % — ABNORMAL HIGH (ref 0.4–3.1)

## 2020-01-22 LAB — PROTIME-INR
INR: 1.1 (ref 0.8–1.2)
Prothrombin Time: 13.7 seconds (ref 11.4–15.2)

## 2020-01-22 LAB — CBC
HCT: 15.6 % — ABNORMAL LOW (ref 36.0–46.0)
Hemoglobin: 3.8 g/dL — CL (ref 12.0–15.0)
MCH: 17.8 pg — ABNORMAL LOW (ref 26.0–34.0)
MCHC: 24.4 g/dL — ABNORMAL LOW (ref 30.0–36.0)
MCV: 72.9 fL — ABNORMAL LOW (ref 80.0–100.0)
Platelets: 182 10*3/uL (ref 150–400)
RBC: 2.14 MIL/uL — ABNORMAL LOW (ref 3.87–5.11)
RDW: 21.2 % — ABNORMAL HIGH (ref 11.5–15.5)
WBC: 8.3 10*3/uL (ref 4.0–10.5)
nRBC: 0 % (ref 0.0–0.2)

## 2020-01-22 LAB — URINALYSIS, ROUTINE W REFLEX MICROSCOPIC
Bilirubin Urine: NEGATIVE
Glucose, UA: NEGATIVE mg/dL
Ketones, ur: NEGATIVE mg/dL
Leukocytes,Ua: NEGATIVE
Nitrite: NEGATIVE
Protein, ur: NEGATIVE mg/dL
Specific Gravity, Urine: 1.005 (ref 1.005–1.030)
pH: 8 (ref 5.0–8.0)

## 2020-01-22 LAB — BASIC METABOLIC PANEL
Anion gap: 8 (ref 5–15)
BUN: 14 mg/dL (ref 8–23)
CO2: 31 mmol/L (ref 22–32)
Calcium: 8.2 mg/dL — ABNORMAL LOW (ref 8.9–10.3)
Chloride: 96 mmol/L — ABNORMAL LOW (ref 98–111)
Creatinine, Ser: 0.69 mg/dL (ref 0.44–1.00)
GFR, Estimated: >60 mL/min (ref >60)
Glucose, Bld: 143 mg/dL — ABNORMAL HIGH (ref 70–99)
Potassium: 3.6 mmol/L (ref 3.5–5.1)
Sodium: 135 mmol/L (ref 135–145)

## 2020-01-22 LAB — FOLATE: Folate: 8.3 ng/mL (ref >5.9)

## 2020-01-22 LAB — IRON AND TIBC
Iron: 14 ug/dL — ABNORMAL LOW (ref 28–170)
Saturation Ratios: 3 % — ABNORMAL LOW (ref 10.4–31.8)
TIBC: 473 ug/dL — ABNORMAL HIGH (ref 250–450)
UIBC: 459 ug/dL

## 2020-01-22 LAB — BRAIN NATRIURETIC PEPTIDE
B Natriuretic Peptide: 118 pg/mL — ABNORMAL HIGH (ref 0.0–100.0)
B Natriuretic Peptide: 149 pg/mL — ABNORMAL HIGH (ref 0.0–100.0)

## 2020-01-22 LAB — RESPIRATORY PANEL BY RT PCR (FLU A&B, COVID)
Influenza A by PCR: NEGATIVE
Influenza B by PCR: NEGATIVE
SARS Coronavirus 2 by RT PCR: NEGATIVE

## 2020-01-22 LAB — FERRITIN: Ferritin: 4 ng/mL — ABNORMAL LOW (ref 11–307)

## 2020-01-22 LAB — POC OCCULT BLOOD, ED: Fecal Occult Bld: POSITIVE — AB

## 2020-01-22 LAB — VITAMIN B12: Vitamin B-12: 180 pg/mL (ref 180–914)

## 2020-01-22 LAB — PREPARE RBC (CROSSMATCH)

## 2020-01-22 LAB — ABO/RH: ABO/RH(D): A POS

## 2020-01-22 MED ORDER — SODIUM CHLORIDE 0.9 % IV SOLN
10.0000 mL/h | Freq: Once | INTRAVENOUS | Status: AC
  Administered 2020-01-22: 10 mL/h via INTRAVENOUS

## 2020-01-22 MED ORDER — OXYCODONE HCL 5 MG PO TABS: 5.0000 mg | ORAL_TABLET | Freq: Three times a day (TID) | ORAL | Status: DC | PRN

## 2020-01-22 MED ORDER — FUROSEMIDE 10 MG/ML IJ SOLN
80.0000 mg | Freq: Once | INTRAMUSCULAR | Status: AC
  Administered 2020-01-22: 80 mg via INTRAVENOUS
  Filled 2020-01-22: qty 8

## 2020-01-22 MED ORDER — TORSEMIDE 20 MG PO TABS: 40.0000 mg | ORAL_TABLET | Freq: Two times a day (BID) | ORAL | 3 refills | Status: DC

## 2020-01-22 MED ORDER — SODIUM CHLORIDE 0.9 % IV SOLN
80.0000 mg | Freq: Once | INTRAVENOUS | Status: AC
  Administered 2020-01-22: 80 mg via INTRAVENOUS
  Filled 2020-01-22: qty 80

## 2020-01-22 MED ORDER — OXYCODONE-ACETAMINOPHEN 5-325 MG PO TABS: 1.0000 | ORAL_TABLET | Freq: Three times a day (TID) | ORAL | Status: DC | PRN

## 2020-01-22 MED ORDER — FUROSEMIDE 10 MG/ML IJ SOLN
40.0000 mg | Freq: Three times a day (TID) | INTRAMUSCULAR | Status: DC
  Administered 2020-01-23 – 2020-01-26 (×11): 40 mg via INTRAVENOUS
  Filled 2020-01-22 (×11): qty 4

## 2020-01-22 MED ORDER — METFORMIN HCL 1000 MG PO TABS: 1000.0000 mg | ORAL_TABLET | Freq: Two times a day (BID) | ORAL | 3 refills | Status: DC

## 2020-01-22 MED ORDER — POTASSIUM CHLORIDE CRYS ER 20 MEQ PO TBCR
20.0000 meq | EXTENDED_RELEASE_TABLET | Freq: Two times a day (BID) | ORAL | 3 refills | Status: DC
Start: 2020-01-22 — End: 2020-02-18

## 2020-01-22 MED ORDER — POTASSIUM CHLORIDE CRYS ER 20 MEQ PO TBCR
40.0000 meq | EXTENDED_RELEASE_TABLET | ORAL | Status: AC
  Administered 2020-01-22 (×2): 40 meq via ORAL
  Filled 2020-01-22 (×2): qty 2

## 2020-01-22 MED ORDER — FUROSEMIDE 10 MG/ML IJ SOLN
40.0000 mg | Freq: Four times a day (QID) | INTRAMUSCULAR | Status: DC
  Administered 2020-01-22: 40 mg via INTRAVENOUS
  Filled 2020-01-22: qty 4

## 2020-01-22 MED ORDER — PANTOPRAZOLE SODIUM 40 MG IV SOLR
40.0000 mg | Freq: Two times a day (BID) | INTRAVENOUS | Status: DC
  Administered 2020-01-26: 40 mg via INTRAVENOUS
  Filled 2020-01-22: qty 40

## 2020-01-22 MED ORDER — PRAVASTATIN SODIUM 10 MG PO TABS
10.0000 mg | ORAL_TABLET | Freq: Every day | ORAL | Status: DC
  Administered 2020-01-22 – 2020-01-25 (×4): 10 mg via ORAL
  Filled 2020-01-22 (×5): qty 1

## 2020-01-22 MED ORDER — SODIUM CHLORIDE 0.9 % IV SOLN
8.0000 mg/h | INTRAVENOUS | Status: AC
  Administered 2020-01-22 – 2020-01-25 (×4): 8 mg/h via INTRAVENOUS
  Filled 2020-01-22 (×9): qty 80

## 2020-01-22 MED ORDER — OXYCODONE-ACETAMINOPHEN 10-325 MG PO TABS: 1.0000 | ORAL_TABLET | Freq: Three times a day (TID) | ORAL | Status: DC | PRN

## 2020-01-22 MED ORDER — ESCITALOPRAM OXALATE 10 MG PO TABS
20.0000 mg | ORAL_TABLET | Freq: Every day | ORAL | Status: DC
  Administered 2020-01-23 – 2020-01-26 (×3): 20 mg via ORAL
  Filled 2020-01-22 (×4): qty 2

## 2020-01-22 NOTE — ED Notes (Signed)
Called [facility] for Protonix.

## 2020-01-22 NOTE — ED Triage Notes (Signed)
Pt to er, pt states that she was sent over from the heart care center, states that they called to say that her hemoglobin was 3.8

## 2020-01-22 NOTE — ED Provider Notes (Signed)
[facility] EMERGENCY DEPARTMENT Provider Note   CSN: [id number] Arrival date & time: 01/22/20  1558    History Chief Complaint  Patient presents with  . Abnormal Lab    Savannah Burns is a 69 y.o. female with history significant for CHF, COPD on chronic home oxygen 6 L via nasal cannula, diabetes, hypertension, hyperlipidemia who presents for evaluation of abnormal lab.  Was seen earlier today by cardiology.  Noted to have a hemoglobin of 3.8.  Patient states she has been anemic previously "many years ago".  States at that time it was due to postmenopausal bleeding.  States she has never had a colonoscopy.  Denies any melena or bright blood per rectum however states that she is unable to wipe herself after having a bowel movement.  She has her granddaughter and daughter help her with this.  They have not noticed any changes in her stools.  Patient states she is up 20 pounds from her dry weight.  Had stopped taking her Lasix however resumed this 2 weeks ago.  Has had some PND and orthopnea.  Has noted worsening of her bilateral lower extremity edema.  Cardiology did recommend possible hospitalization for IV Lasix given patient had doubled her Lasix at home and did not have any significant increase in urination or weight changes.  Patient had wanted to try outpatient management first.  Patient does states she has generalized fatigue and weakness.  She is unsure if this is increased from her baseline.  Patient states she cannot do much at home due to her chronic shortness of breath.  Denies headache, lightheadedness, dizziness, chest pain, hemoptysis abdominal pain, diarrhea, dysuria.  Has not noted any bleeding.  She denies any pain.  She is on anticoagulation. Denies rashes or bruising. Denies additional aggravating or alleviating factors.  History obtained from patient and past medical records.  No interpreter used.  HPI     Past Medical History:  Diagnosis Date  . Adrenal adenoma, left     Stable  . Anxiety   . Arthritis    bilateral hands  . CHF (congestive heart failure) ([place])   . COPD (chronic obstructive pulmonary disease) ([place])   . Depression   . Diabetes mellitus without complication ([place])   . Dyspnea   . Grade II diastolic dysfunction   . History of kidney stones   . Hyperlipidemia   . Hypertension   . Lower back pain   . Panic attacks   . Pneumonia    currently taking antibiotic and prednisone for early stages of pneumonia  . Pulmonary nodules    bilateral  . Skin cancer    face    Patient Active Problem List   Diagnosis Date Noted  . Supplemental oxygen dependent 10/22/2019  . Chronic dyspnea 10/22/2019  . Hypertension associated with diabetes ([place]) 10/22/2019  . Bilateral hand pain 09/21/2016  . Fatigue 07/30/2015  . Chronic diastolic HF (heart failure) ([place]) 06/29/2015  . Postmenopausal bleeding 04/17/2015  . Excessive daytime sleepiness 12/19/2014  . Swelling of lower extremity 11/27/2014  . Back pain 06/13/2013  . Sinusitis, chronic 05/30/2012  . Allergic rhinitis 06/06/2011  . Hyperlipidemia associated with type 2 diabetes mellitus ([place]) 03/24/2009  . Diabetes mellitus, type 2 ([place]) 08/12/2008  . Pulmonary nodule 08/29/2007  . COPD (chronic obstructive pulmonary disease) ([place]) 02/14/2007  . OBESITY, NOS 05/25/2006  . TOBACCO DEPENDENCE 05/25/2006  . Depression, recurrent ([place]) 05/25/2006  . HYPERTENSION, BENIGN SYSTEMIC 05/25/2006  . DJD, UNSPECIFIED 05/25/2006  Past Surgical History:  Procedure Laterality Date  . Breast Cystectomy  Right   . CESAREAN SECTION    . CYSTOSCOPY/URETEROSCOPY/HOLMIUM LASER/STENT PLACEMENT Bilateral 03/01/2019   Procedure: CYSTOSCOPY/RETROGRADEURETEROSCOPY/HOLMIUM LASER/STENT PLACEMENT;  Surgeon: Ceasar Mons, MD;  Location: [facility];  Service: Urology;  Laterality: Bilateral;  ONLY NEEDS 60 MIN  . SKIN CANCER EXCISION     Face  . SPINE SURGERY       OB History   No obstetric history on  file.     Family History  Problem Relation Age of Onset  . Diabetes Father   . Heart disease Father 28       MI  . Hypertension Father   . COPD Sister   . Cancer Paternal Grandmother 34       Pancreatic    Social History   Tobacco Use  . Smoking status: Former Smoker    Packs/day: 1.50    Years: 40.00    Pack years: 60.00    Types: Cigarettes    Quit date: 04/29/2015    Years since quitting: 4.7  . Smokeless tobacco: Former Systems developer    Quit date: 04/29/2015  . Tobacco comment: Quit smoking 04/2015- Previous 1.5 ppd smoker  Vaping Use  . Vaping Use: Never used  Substance Use Topics  . Alcohol use: No    Alcohol/week: 0.0 standard drinks  . Drug use: No    Home Medications Prior to Admission medications   Medication Sig Start Date End Date Taking? Authorizing Provider  aspirin 81 MG tablet Take 81 mg by mouth daily. Reported on 06/01/2015    [provider]  Blood Glucose Calibration (GLUCOMETER DEX HIGH CONTROL) LIQD Test CBG's once daily.     [provider]  Blood Glucose Monitoring Suppl (TRUE METRIX AIR GLUCOSE METER) w/Device KIT Check BS daily Dx E11.9 10/02/19   Ronnie Doss M, DO  escitalopram (LEXAPRO) 20 MG tablet Take 1 tablet (20 mg total) by mouth daily. Patient not taking: Reported on 01/22/2020 10/22/19   [name], DO  glucose blood (TRUE METRIX BLOOD GLUCOSE TEST) test strip Check BS daily Dx E11.9 10/02/19   Ronnie Doss M, DO  hydrOXYzine (ATARAX/VISTARIL) 10 MG tablet Take 1 tablet (10 mg total) by mouth 3 (three) times daily as needed for anxiety. Patient not taking: Reported on 01/22/2020 07/02/18   [name], DO  ketoconazole (NIZORAL) 2 % cream  04/05/19   [provider]  lisinopril (ZESTRIL) 10 MG tablet Take 1 tablet (10 mg total) by mouth daily. Patient not taking: Reported on 01/22/2020 10/22/19   [name], DO  lovastatin (MEVACOR) 20 MG tablet Take 1 tablet (20 mg total) by mouth at  bedtime. Patient not taking: Reported on 01/22/2020 10/30/19   [name], DO  metFORMIN (GLUCOPHAGE) 1000 MG tablet Take 1 tablet (1,000 mg total) by mouth 2 (two) times daily with a meal. 01/22/20   [name], PA-C  oxyCODONE-acetaminophen (PERCOCET) 10-325 MG tablet Take 1 tablet by mouth every 6 (six) hours as needed for pain.    [provider]  potassium chloride SA (KLOR-CON) 20 MEQ tablet Take 1 tablet (20 mEq total) by mouth 2 (two) times daily. 01/22/20   [name], PA-C  torsemide (DEMADEX) 20 MG tablet Take 2 tablets (40 mg total) by mouth 2 (two) times daily. 01/22/20   [name], PA-C  TRUEplus Lancets 30G MISC Check BS daily Dx E11.9 10/02/19   [name],  DO  albuterol (PROVENTIL HFA;VENTOLIN HFA) 108 (90 Base) MCG/ACT inhaler Inhale 2 puffs into the lungs every 6 (six) hours as needed for wheezing or shortness of breath. 09/21/16 09/22/16  Katheren Shams, DO    Allergies    Keflex [cephalexin]  Review of Systems   Review of Systems  Constitutional: Positive for activity change and fatigue. Diaphoresis: Chronic.  HENT: Negative.   Respiratory: Positive for shortness of breath (Chronic). Negative for apnea, cough, choking, chest tightness, wheezing and stridor.   Cardiovascular: Positive for leg swelling. Negative for chest pain and palpitations.  Gastrointestinal: Negative.   Genitourinary: Negative.   Musculoskeletal: Negative.   Skin: Negative.   Neurological: Positive for weakness (Generalized, chronic). Negative for dizziness, tremors, seizures, syncope, facial asymmetry, speech difficulty, light-headedness, numbness and headaches.  All other systems reviewed and are negative.   Physical Exam Updated Vital Signs BP 110/66   Pulse 96   Temp 99 F (37.2 C) (Oral)   Resp (!) 21   Ht 5' 2"  (1.575 m)   Wt (!) 140.2 kg   SpO2 100%   BMI 56.52 kg/m   Physical Exam Vitals and nursing note reviewed.  Constitutional:       General: She is not in acute distress.    Appearance: She is well-developed. She is obese. She is ill-appearing. She is not toxic-appearing or diaphoretic.  HENT:     Head: Normocephalic and atraumatic.     Jaw: There is normal jaw occlusion.     Nose: Nose normal.     Mouth/Throat:     Mouth: Mucous membranes are moist.  Eyes:     Extraocular Movements: Extraocular movements intact.     Pupils: Pupils are equal, round, and reactive to light.     Comments: Pale conjunctiva  Cardiovascular:     Rate and Rhythm: Normal rate.     Pulses:          Dorsalis pedis pulses are 1+ on the right side and 1+ on the left side.     Heart sounds: Normal heart sounds.  Pulmonary:     Effort: Pulmonary effort is normal. No respiratory distress.     Breath sounds: Normal breath sounds.     Comments: Tachypneic.  Speaks in full sentences without difficulty. Abdominal:     General: Bowel sounds are normal. There is no distension.     Palpations: There is no mass.     Tenderness: There is no abdominal tenderness. There is no right CVA tenderness, left CVA tenderness or guarding.     Hernia: No hernia is present.     Comments: Soft, nontender without rebound or guarding  Genitourinary:    Comments: Light brown stool in rectal vault.  Non tender hemorrhoids noted Musculoskeletal:        General: No swelling, tenderness, deformity or signs of injury. Normal range of motion.     Cervical back: Normal range of motion.     Right lower leg: Edema present.     Left lower leg: Edema present.     Comments: No bony tenderness.  Moves all 4 extremities without difficulty.  Feet:     Comments: 3+ pitting edema to BL extremities to knees Skin:    General: Skin is warm and dry.     Capillary Refill: Capillary refill takes 2 to 3 seconds.     Coloration: Skin is pale.     Comments: Pallor.  Neurological:     General: No focal deficit present.  Mental Status: She is alert and oriented to person, place, and  time.     Comments: Cranial nerves II through XII grossly intact Ambulatory from wheelchair to bed at baseline.    ED Results / Procedures / Treatments   Labs (all labs ordered are listed, but only abnormal results are displayed) Labs Reviewed  IRON AND TIBC - Abnormal; Notable for the following components:      Result Value   Iron 14 (*)    TIBC 473 (*)    Saturation Ratios 3 (*)    All other components within normal limits  FERRITIN - Abnormal; Notable for the following components:   Ferritin 4 (*)    All other components within normal limits  CBC WITH DIFFERENTIAL/PLATELET - Abnormal; Notable for the following components:   RBC 2.12 (*)    Hemoglobin 3.7 (*)    HCT 15.3 (*)    MCV 72.2 (*)    MCH 17.5 (*)    MCHC 24.2 (*)    RDW 21.5 (*)    All other components within normal limits  BASIC METABOLIC PANEL - Abnormal; Notable for the following components:   Chloride 96 (*)    Glucose, Bld 143 (*)    Calcium 8.2 (*)    All other components within normal limits  BRAIN NATRIURETIC PEPTIDE - Abnormal; Notable for the following components:   B Natriuretic Peptide 149.0 (*)    All other components within normal limits  POC OCCULT BLOOD, ED - Abnormal; Notable for the following components:   Fecal Occult Bld POSITIVE (*)    All other components within normal limits  RESPIRATORY PANEL BY RT PCR (FLU A&B, COVID)  VITAMIN B12  PROTIME-INR  FOLATE  RETICULOCYTES  URINALYSIS, ROUTINE W REFLEX MICROSCOPIC  TYPE AND SCREEN  PREPARE RBC (CROSSMATCH)    EKG None  Radiology DG Chest Portable 1 View  Result Date: 01/22/2020 CLINICAL DATA:  Dyspnea EXAM: PORTABLE CHEST 1 VIEW COMPARISON:  02/28/2019 chest radiograph. FINDINGS: Stable cardiomediastinal silhouette with mild cardiomegaly. No pneumothorax. No pleural effusion. Mild pulmonary edema. No acute consolidative airspace disease. IMPRESSION: Mild congestive heart failure. Electronically Signed   By: Ilona Sorrel M.D.   On:  01/22/2020 17:24    Procedures .Critical Care Performed by: Nettie Elm, PA-C Authorized by: Nettie Elm, PA-C   Critical care provider statement:    Critical care time (minutes):  45   Critical care was necessary to treat or prevent imminent or life-threatening deterioration of the following conditions:  Metabolic crisis   Critical care was time spent personally by me on the following activities:  Discussions with consultants, evaluation of patient's response to treatment, examination of patient, ordering and performing treatments and interventions, ordering and review of laboratory studies, ordering and review of radiographic studies, pulse oximetry, re-evaluation of patient's condition, obtaining history from patient or surrogate and review of old charts   (including critical care time)  Medications Ordered in ED Medications  0.9 %  sodium chloride infusion (10 mL/hr Intravenous New Bag/Given 01/22/20 1810)  furosemide (LASIX) injection 80 mg (80 mg Intravenous Given 01/22/20 1810)    ED Course  I have reviewed the triage vital signs and the nursing notes.  Pertinent labs & imaging results that were available during my care of the patient were reviewed by me and considered in my medical decision making (see chart for details).  69 year old female with chronic hypoxic respiratory failure presents for evaluation of abnormal lab.  She is afebrile,  nonseptic appearing.  Does appear chronically ill at baseline.  6 L via nasal cannula at baseline.  Seen by cardiology earlier today.  Had labs taken.  Cardiology did originally want her admitted for IV diuresis as patient is up 20 pounds from her dry weight despite doubling her diuretics at home.  Initially was reluctant to this.  Noted to have hemoglobin 3.8 with cardiology.  She denies any melanotic or bright red blood per stools however states she is also unable to wipe her bottom after having bowel movements.  Family has not  noted any change in her stools.  She is not on anticoagulation.  Has not noted any vaginal bleeding.  She denies any pain.  Does appear very pale.  Significant edema to her bilateral lower extremities.  Appears dyspneic with minimal movement in bed. Plan on labs, imaging and reassess. Will send iron panel given no obvious source of active bleeding. On exam.  Labs and imaging personally reviewed and interpreted:  DG chest with mild CHF EKG pending Occult positive CBC without leukocytosis, Hgb 3.7 INR 1.1 BMP with mild hyperglycemia to 143, consistent with her history of diabetes, calcium 8.2, chloride 96, no additional electrolyte, renal or liver abnormality BNP 149 COVID pending  Patient with critically low hemoglobin at 3.7.  Given she is also significantly fluid overloaded and cardiology had recommended IV diuresis will give additional dose of Lasix here in ED. Occult positive here in ED however no gross blood on exam. Will need further workup and admission. No petechial rashes or bruising to suggest ITP. No recent falls, injuries, no evidence of acute trauma on exam to suggest intrathoracic, intra-abdominal or intracranial bleed.  Patient will need to be admitted for critical electrolyte abnormalities as well as IV diuresis given failed outpatient treatment of her CHF.  CONSULT with Dr. Laural Golden with GI, Will plan to see tomorrow and likely scope on Friday.  Care transferred to Dr. Gilford Raid who will follow up on placing admission call.   Patient seen and evaluated by attending Dr. Gilford Raid who agrees with above treatment, plan and disposition.  The patient appears reasonably stabilized for admission considering the current resources, flow, and capabilities available in the ED at this time, and I doubt any other [facility] requiring further screening and/or treatment in the ED prior to admission.     MDM Rules/Calculators/A&P                           Final Clinical Impression(s) / ED  Diagnoses Final diagnoses:  Symptomatic anemia  Acute on chronic congestive heart failure, unspecified heart failure type ([place])  Occult blood positive stool    Rx / DC Orders ED Discharge Orders    None       Uchenna Rappaport A, PA-C 01/22/20 1823    Isla Pence, MD 01/22/20 1841

## 2020-01-22 NOTE — Telephone Encounter (Signed)
Patient states she is going to see cardio today and will get them to write it.

## 2020-01-22 NOTE — Telephone Encounter (Signed)
Orders for DME printed and signed - pt and daughter are aware - faxed to [facility].

## 2020-01-22 NOTE — Telephone Encounter (Signed)
lmtcb

## 2020-01-22 NOTE — Patient Instructions (Signed)
Medication Instructions:  Your physician has recommended you make the following change in your medication:   STOP: Furosemide START: Torsemide 67m twice daily START: Potassium 29m twice daily START: Metformin 1,00052mwice daily  *If you need a refill on your cardiac medications before your next appointment, please call your pharmacy*   Lab Work: TODAY: BNP, CMET, CBC, A1C  If you have labs (blood work) drawn today and your tests are completely normal, you will receive your results only by: . MMarland KitchenChart Message (if you have MyChart) OR . A paper copy in the mail If you have any lab test that is abnormal or we need to change your treatment, we will call you to review the results.  Follow-Up: [facility] and your health needs are our priority.  As part of our continuing mission to provide you with exceptional heart care, we have created designated Provider Care Teams.  These Care Teams include your primary Cardiologist (physician) and Advanced Practice Providers (APPs -  Physician Assistants and Nurse Practitioners) who all work together to provide you with the care you need, when you need it.  We recommend signing up for the patient portal called "MyChart".  Sign up information is provided on this After Visit Summary.  MyChart is used to connect with patients for Virtual Visits (Telemedicine).  Patients are able to view lab/test results, encounter notes, upcoming appointments, etc.  Non-urgent messages can be sent to your provider as well.   To learn more about what you can do with MyChart, go to [email or web address]  Your next appointment:   2 week(s)  The format for your next appointment:   In Person  Provider:   MicErmalinda BarriosA-C  Other Instructions If you are not feeling any better in 2 days you need to go to the Emergency Dept.

## 2020-01-22 NOTE — Telephone Encounter (Signed)
Lady from [facility] called stating that a CMN is needed for compression stockings before they can file to insurance.  Can call [facility] @ [phone]

## 2020-01-22 NOTE — TOC Initial Note (Signed)
Transition of Care [facility]) - Initial/Assessment Note    Patient Details  Name: Savannah Burns MRN: [id number] Date of Birth: [birth year]  Transition of [facility] CM/SW Contact:    Iona Beard, [place] Phone Number: [phone] 01/22/2020, 7:59 PM  Clinical Narrative:                 Pt admitted for critical electrolyte abnormalities. TOC received consult for CHF screen. Pt lives alone. Pt needs assistance in completing ADL's, pts granddaughter currently offering help. Pt does not drive and her daughter provides transportation. Pt states that she has not previously been with a [place]. Pt states that she uses a rollator at home. Pt is chronic on O2 5L and it is supplied through [place]. CSW spoke with pt about interest in going to SNF if recommended by PT. Pt states that she would be interested and that she feels she would benefit from rehab to build her strength up. CSW explained that if SNF is recommended by PT, TOC will send referral out to local facilities, pt accepting of this. Pt mentioned that she has been told she may be able to get medicaid and asking if TOC works with this. CSW informed that she will update TOC team for f/u.   CSW completed CHF screen with pt. Pt states that she does not weigh herself daily. CSW explained importance of daily weights and consulting Cardiologist Dr. Harl Bowie if 2lb increase in weight. Pt states that she does not always have enough money to pay to go to her cardiologist. Pt states that she tries to follow a heart healthy diet. Pt states that she has not been taking medications as prescribed but that she plans to start back. TOC to follow.    Expected Discharge Plan: Skilled Nursing Facility Barriers to Discharge: Continued Medical Work up   Patient Goals and CMS Choice Patient states their goals for this hospitalization and ongoing recovery are:: Go to SNF for rehab pending PT recommendation   Choice offered to / list presented to :  NA  Expected Discharge Plan and Services Expected Discharge Plan: [place] Choice: [place] Living arrangements for the past 2 months: Single Family Home                                      Prior Living Arrangements/Services Living arrangements for the past 2 months: Single Family Home Lives with:: Self Patient language and need for interpreter reviewed:: Yes Do you feel safe going back to the place where you live?: Yes        Care giver support system in place?: Yes (comment) Namira, [place] (Daughter) [phone])   Criminal Activity/Legal Involvement Pertinent to Current Situation/Hospitalization: No - Comment as needed  Activities of Daily Living      Permission Sought/Granted                  Emotional Assessment Appearance:: Appears stated age Attitude/Demeanor/Rapport: Engaged Affect (typically observed): Accepting Orientation: : Oriented to Self, Oriented to Place, Oriented to  Time, Oriented to Situation Alcohol / Substance Use: Not Applicable Psych Involvement: No (comment)  Admission diagnosis:  ABLA (acute blood loss anemia) [D62] Patient Active Problem List   Diagnosis Date Noted  . ABLA (acute blood loss anemia) 01/22/2020  . Supplemental oxygen dependent 10/22/2019  . Chronic dyspnea 10/22/2019  . Hypertension associated  with diabetes ([place]) 10/22/2019  . Bilateral hand pain 09/21/2016  . Fatigue 07/30/2015  . Chronic diastolic HF (heart failure) ([place]) 06/29/2015  . Postmenopausal bleeding 04/17/2015  . Excessive daytime sleepiness 12/19/2014  . Swelling of lower extremity 11/27/2014  . Back pain 06/13/2013  . Sinusitis, chronic 05/30/2012  . Allergic rhinitis 06/06/2011  . Hyperlipidemia associated with type 2 diabetes mellitus ([place]) 03/24/2009  . Diabetes mellitus, type 2 ([place]) 08/12/2008  . Pulmonary nodule 08/29/2007  . COPD (chronic obstructive pulmonary disease) ([place])  02/14/2007  . OBESITY, NOS 05/25/2006  . TOBACCO DEPENDENCE 05/25/2006  . Depression, recurrent ([place]) 05/25/2006  . HYPERTENSION, BENIGN SYSTEMIC 05/25/2006  . DJD, UNSPECIFIED 05/25/2006   PCP:  [name], DO Pharmacy:   [facility] [address] [place] [address] Phone: [phone] Fax: [place], [address] [place], [address] [place], [place] [address] Phone: [phone] Fax: [phone]     Social Determinants of Health (SDOH) Interventions    Readmission Risk Interventions No flowsheet data found.

## 2020-01-22 NOTE — H&P (Signed)
[facility] H&P   Patient Demographics:    Myrikal Messmer, is a 69 y.o. female  MRN: [id number]   DOB - [birth year]  Admit Date - 01/22/2020  Outpatient Primary MD for the patient is [name], DO  Referring MD/NP/PA: Dr Gilford Raid  Patient coming from: Home  Chief Complaint  Patient presents with  . Abnormal Lab      HPI:    [name]  is a 69 y.o. female, with past medical history of chronic diastolic CHF, COPD on chronic 5 L nasal cannula, diabetes mellitus, hypertension, hyperlipidemia, patient was sent by her cardiologist, with progressive dyspnea over the last couple weeks, she has not been taking her medications for few month, but she is with significant weight gain, for which she started to take her Lasix recently, she remains dyspneic and she went for evaluation by her cardiologist, he had blood work done, for which was noted to have anemia with hemoglobin of 3.8, so she was instructed to come to ED for further evaluation, patient herself reports she has extremely poor ambulation at baseline, wheelchair dependent, noncompliance with her meds, and she is with significant fluid retention with 20 pounds weight gain from her dry weight, she is unclear if she is having early melena as she has not been looking at her bowel movements, but she has been using commodes, and was discussed with family, she has been having dark tarry stool, patient denies any chest pain, coffee-ground emesis, but she is short of breath, denies any vaginal bleed, but she does report history of fibroids in the past. - in ED repeat blood work confirms anemia with hemoglobin of 3.8, she was Hemoccult positive, BNP elevated at 118, Attending is stable at 0.69, hemoglobin at 3.7, platelet count at 184, ED discussed with Dr. Melony Overly from GI, who recommended admission and need for scope in a.m., [facility]ist consulted to admit.   Review of systems:    In addition to the HPI above,  No Fever-chills, reports fatigue, generalized weakness No Headache, No changes with Vision or hearing, No problems swallowing food or Liquids, No Chest pain, Cough, she reports dyspnea, she reports worsening lower extremity edema and fluid retention No Abdominal pain, No Nausea or Vommitting, Bowel movements are regular, No Blood  Urine, vaginal bleed, but reports of melena(by family member who is cleaning patients commode ) No dysuria, No new skin rashes or bruises, No new joints pains-aches,  No new weakness, tingling, numbness in any extremity, No recent weight gain or loss, No polyuria, polydypsia or polyphagia, No significant Mental Stressors.  A full 10 point Review of Systems was done, except as stated above, all other Review of Systems were negative.   With Past History of the following :    Past Medical History:  Diagnosis Date  . Adrenal adenoma, left    Stable  . Anxiety   .  Arthritis    bilateral hands  . CHF (congestive heart failure) ([place])   . COPD (chronic obstructive pulmonary disease) ([place])   . Depression   . Diabetes mellitus without complication ([place])   . Dyspnea   . Grade II diastolic dysfunction   . History of kidney stones   . Hyperlipidemia   . Hypertension   . Lower back pain   . Panic attacks   . Pneumonia    currently taking antibiotic and prednisone for early stages of pneumonia  . Pulmonary nodules    bilateral  . Skin cancer    face      Past Surgical History:  Procedure Laterality Date  . Breast Cystectomy  Right   . CESAREAN SECTION    . CYSTOSCOPY/URETEROSCOPY/HOLMIUM LASER/STENT PLACEMENT Bilateral 03/01/2019   Procedure: CYSTOSCOPY/RETROGRADEURETEROSCOPY/HOLMIUM LASER/STENT PLACEMENT;  Surgeon: Ceasar Mons, MD;  Location: [facility];  Service: Urology;  Laterality: Bilateral;  ONLY NEEDS 60 MIN  . SKIN CANCER EXCISION     Face  .  SPINE SURGERY        Social History:     Social History   Tobacco Use  . Smoking status: Former Smoker    Packs/day: 1.50    Years: 40.00    Pack years: 60.00    Types: Cigarettes    Quit date: 04/29/2015    Years since quitting: 4.7  . Smokeless tobacco: Former Systems developer    Quit date: 04/29/2015  . Tobacco comment: Quit smoking 04/2015- Previous 1.5 ppd smoker  Substance Use Topics  . Alcohol use: No    Alcohol/week: 0.0 standard drinks       Family History :     Family History  Problem Relation Age of Onset  . Diabetes Father   . Heart disease Father 48       MI  . Hypertension Father   . COPD Sister   . Cancer Paternal Grandmother 70       Pancreatic     Home Medications:   Prior to Admission medications   Medication Sig Start Date End Date Taking? Authorizing Provider  aspirin 81 MG tablet Take 81 mg by mouth daily. Reported on 06/01/2015   Yes [provider]  escitalopram (LEXAPRO) 20 MG tablet Take 1 tablet (20 mg total) by mouth daily. 10/22/19  Yes Gottschalk, Leatrice Jewels M, DO  hydrOXYzine (ATARAX/VISTARIL) 10 MG tablet Take 1 tablet (10 mg total) by mouth 3 (three) times daily as needed for anxiety. 07/02/18  Yes Gottschalk, Leatrice Jewels M, DO  lisinopril (ZESTRIL) 10 MG tablet Take 1 tablet (10 mg total) by mouth daily. 10/22/19  Yes Gottschalk, Ashly M, DO  lovastatin (MEVACOR) 20 MG tablet Take 1 tablet (20 mg total) by mouth at bedtime. 10/30/19  Yes Ronnie Doss M, DO  metFORMIN (GLUCOPHAGE) 1000 MG tablet Take 1 tablet (1,000 mg total) by mouth 2 (two) times daily with a meal. 01/22/20  Yes [name], PA-C  oxyCODONE-acetaminophen (PERCOCET) 10-325 MG tablet Take 1 tablet by mouth every 6 (six) hours as needed for pain.   Yes [provider]  Blood Glucose Calibration (GLUCOMETER DEX HIGH CONTROL) LIQD Test CBG's once daily.  Patient not taking: Reported on 01/22/2020    [provider]  Blood Glucose Monitoring Suppl (TRUE METRIX AIR  GLUCOSE METER) w/Device KIT Check BS daily Dx E11.9 10/02/19   Ronnie Doss M, DO  glucose blood (TRUE METRIX BLOOD GLUCOSE TEST) test strip Check BS daily Dx E11.9 10/02/19   Lajuana Ripple,  Ashly M, DO  potassium chloride SA (KLOR-CON) 20 MEQ tablet Take 1 tablet (20 mEq total) by mouth 2 (two) times daily. 01/22/20   [name], PA-C  torsemide (DEMADEX) 20 MG tablet Take 2 tablets (40 mg total) by mouth 2 (two) times daily. 01/22/20   [name], PA-C  TRUEplus Lancets 30G MISC Check BS daily Dx E11.9 10/02/19   Ronnie Doss M, DO  albuterol (PROVENTIL HFA;VENTOLIN HFA) 108 (90 Base) MCG/ACT inhaler Inhale 2 puffs into the lungs every 6 (six) hours as needed for wheezing or shortness of breath. 09/21/16 09/22/16  Katheren Shams, DO     Allergies:     Allergies  Allergen Reactions  . Keflex [Cephalexin] Nausea And Vomiting     Physical Exam:   Vitals  Blood pressure (!) 111/44, pulse 96, temperature 99 F (37.2 C), temperature source Oral, resp. rate (!) 24, height 5' 2" (1.575 m), weight (!) 140.2 kg, SpO2 100 %.   1. General Beese female, laying in bed, in no apparent distress  2. Normal affect and insight, Not Suicidal or Homicidal, Awake Alert, Oriented X 3.  3. No F.N deficits, ALL C.Nerves Intact, Strength 5/5 all 4 extremities, Sensation intact all 4 extremities, Plantars down going.  4. Ears and Eyes appear Normal, Conjunctivae clear, PERRLA. Moist Oral Mucosa.  5. Supple Neck, No JVD, No cervical lymphadenopathy appriciated, No Carotid Bruits.  6. Symmetrical Chest wall movement, Good air movement bilaterally, CTAB.  +3 lower extremity edema 7. RRR, No Gallops, Rubs or Murmurs, No Parasternal Heave.  8. Positive Bowel Sounds, Abdomen Soft, No tenderness, No organomegaly appriciated,No rebound -guarding or rigidity.  9.  No Cyanosis, Normal Skin Turgor, No Skin Rash or Bruise.  10. Good muscle tone,  joints appear normal , no effusions, Normal  ROM.  11. No Palpable Lymph Nodes in Neck or Axillae    Data Review:    CBC Recent Labs  Lab 01/22/20 1457 01/22/20 1639  WBC 8.3 9.0  HGB 3.8* 3.7*  HCT 15.6* 15.3*  PLT 182 184  MCV 72.9* 72.2*  MCH 17.8* 17.5*  MCHC 24.4* 24.2*  RDW 21.2* 21.5*  LYMPHSABS  --  1.9  MONOABS  --  0.8  EOSABS  --  0.1  BASOSABS  --  0.0   ------------------------------------------------------------------------------------------------------------------  Chemistries  Recent Labs  Lab 01/22/20 1457 01/22/20 1639  NA 135 135  K 3.7 3.6  CL 97* 96*  CO2 32 31  GLUCOSE 168* 143*  BUN 13 14  CREATININE 0.61 0.69  CALCIUM 8.2* 8.2*  AST 14*  --   ALT 8  --   ALKPHOS 64  --   BILITOT 0.5  --    ------------------------------------------------------------------------------------------------------------------ estimated creatinine clearance is 88.9 mL/min (by C-G formula based on SCr of 0.69 mg/dL). ------------------------------------------------------------------------------------------------------------------ No results for input(s): TSH, T4TOTAL, T3FREE, THYROIDAB in the last 72 hours.  Invalid input(s): FREET3  Coagulation profile Recent Labs  Lab 01/22/20 1639  INR 1.1   ------------------------------------------------------------------------------------------------------------------- No results for input(s): DDIMER in the last 72 hours. -------------------------------------------------------------------------------------------------------------------  Cardiac Enzymes No results for input(s): CKMB, TROPONINI, MYOGLOBIN in the last 168 hours.  Invalid input(s): CK ------------------------------------------------------------------------------------------------------------------    Component Value Date/Time   BNP 149.0 (H) 01/22/2020 1655     ---------------------------------------------------------------------------------------------------------------  Urinalysis     Component Value Date/Time   COLORURINE yellow 07/23/2007 1049   APPEARANCEUR Clear 07/23/2007 1049   LABSPEC 1.015 07/23/2007 1049   PHURINE 7.0 07/23/2007 1049  HGBUR negative 07/23/2007 1049   BILIRUBINUR NEG 04/30/2015 1205   PROTEINUR NEG 04/30/2015 1205   UROBILINOGEN 0.2 04/30/2015 1205   UROBILINOGEN 0.2 07/23/2007 1049   NITRITE POS 04/30/2015 1205   NITRITE negative 07/23/2007 1049   LEUKOCYTESUR small (1+) (A) 04/30/2015 1205    ----------------------------------------------------------------------------------------------------------------   Imaging Results:    DG Chest Portable 1 View  Result Date: 01/22/2020 CLINICAL DATA:  Dyspnea EXAM: PORTABLE CHEST 1 VIEW COMPARISON:  02/28/2019 chest radiograph. FINDINGS: Stable cardiomediastinal silhouette with mild cardiomegaly. No pneumothorax. No pleural effusion. Mild pulmonary edema. No acute consolidative airspace disease. IMPRESSION: Mild congestive heart failure. Electronically Signed   By: Ilona Sorrel M.D.   On: 01/22/2020 17:24    My personal review of EKG: Rhythm NSR, Rate  105 /min, QTc 446, no Acute ST changes   Assessment & Plan:    Active Problems:   Diabetes mellitus, type 2 ([place])   Hyperlipidemia associated with type 2 diabetes mellitus ([facility])   COPD (chronic obstructive pulmonary disease) ([facility])   Chronic diastolic HF (heart failure) ([facility])   Chronic dyspnea   ABLA (acute blood loss anemia)  Symptomatic anemia/acute blood loss anemia/GI bleed -She is with significant anemia and drop in her hemoglobin to 3.8, she is symptomatic with dyspnea. -We will start on Protonix drip, hold her aspirin, keep clear liquid diet currently,  likely will need endoscopy by Friday as ED  discussed with GI Dr. Laural Golden -she will be Transfused 2 units PRBC  COPD/chronic hypoxic respiratory failure -Appears  to be stable, no active wheezing, oxygen at baseline, continue with home medications  Acute on chronic diastolic  CHF -She stopped taking her meds 6 months ago, she is recently back on her Lasix, but appears to be significantly volume overload, with evidence of volume overload on chest x-ray, she will be kept on Lasix 40 mg IV every 6 hours x3 doses, she already received 1 dose of Lasix 80 mg,. -Renew Lasix dosing in a.m. pending her volume status. -Calcium of 3.6, likely will drop with IV diuresis, will give potassium supplements overnight.  Hypertension -Blood pressure is acceptable, will resume lisinopril once stable.  Diabetes mellitus type 2 -She has stopped Metformin, we will keep her insulin sliding scale and check A1c  Mild aortic stenosis -Need repeat echo after diuresis DVT Prophylaxis  SCDs  AM Labs Ordered, also please review Full Orders  Family Communication: Admission, patients condition and plan of care including tests being ordered have been discussed with the patient and daughter at bedside who indicate understanding and agree with the plan and Code Status.  Code Status Full  Likely DC to  home  Condition GUARDED    Consults called: GI By ED    Admission status: inpatient    Time spent in minutes : 60 minutes   Phillips Climes M.D on 01/22/2020 at 7:36 PM   [facility] - Office  [phone]

## 2020-01-22 NOTE — Telephone Encounter (Signed)
Received a call from Charlie Pitter in the lab stating that pt had a critical hemoglobin of 3.8. Ermalinda Barrios PA-C notified and order given for pt to return and be seen in the ER. Daughter Lafonda Patron notified and voiced understanding.

## 2020-01-23 ENCOUNTER — Encounter (HOSPITAL_COMMUNITY): Payer: Self-pay | Admitting: Internal Medicine

## 2020-01-23 DIAGNOSIS — D509 Iron deficiency anemia, unspecified: Secondary | ICD-10-CM | POA: Diagnosis not present

## 2020-01-23 DIAGNOSIS — D649 Anemia, unspecified: Secondary | ICD-10-CM | POA: Diagnosis not present

## 2020-01-23 DIAGNOSIS — R195 Other fecal abnormalities: Secondary | ICD-10-CM | POA: Diagnosis not present

## 2020-01-23 LAB — CBC
HCT: 20.1 % — ABNORMAL LOW (ref 36.0–46.0)
Hemoglobin: 5.5 g/dL — CL (ref 12.0–15.0)
MCH: 21.3 pg — ABNORMAL LOW (ref 26.0–34.0)
MCHC: 27.4 g/dL — ABNORMAL LOW (ref 30.0–36.0)
MCV: 77.9 fL — ABNORMAL LOW (ref 80.0–100.0)
Platelets: 176 10*3/uL (ref 150–400)
RBC: 2.58 MIL/uL — ABNORMAL LOW (ref 3.87–5.11)
RDW: 21.2 % — ABNORMAL HIGH (ref 11.5–15.5)
WBC: 8 10*3/uL (ref 4.0–10.5)
nRBC: 0 % (ref 0.0–0.2)

## 2020-01-23 LAB — COMPREHENSIVE METABOLIC PANEL
ALT: 8 U/L (ref 0–44)
AST: 15 U/L (ref 15–41)
Albumin: 2.5 g/dL — ABNORMAL LOW (ref 3.5–5.0)
Alkaline Phosphatase: 65 U/L (ref 38–126)
Anion gap: 7 (ref 5–15)
BUN: 13 mg/dL (ref 8–23)
CO2: 33 mmol/L — ABNORMAL HIGH (ref 22–32)
Calcium: 7.6 mg/dL — ABNORMAL LOW (ref 8.9–10.3)
Chloride: 95 mmol/L — ABNORMAL LOW (ref 98–111)
Creatinine, Ser: 0.73 mg/dL (ref 0.44–1.00)
GFR, Estimated: >60 mL/min (ref >60)
Glucose, Bld: 116 mg/dL — ABNORMAL HIGH (ref 70–99)
Potassium: 4.1 mmol/L (ref 3.5–5.1)
Sodium: 135 mmol/L (ref 135–145)
Total Bilirubin: 0.9 mg/dL (ref 0.3–1.2)
Total Protein: 7.5 g/dL (ref 6.5–8.1)

## 2020-01-23 LAB — CBG MONITORING, ED: Glucose-Capillary: 116 mg/dL — ABNORMAL HIGH (ref 70–99)

## 2020-01-23 LAB — HEMOGLOBIN A1C
Hgb A1c MFr Bld: 6.3 % — ABNORMAL HIGH (ref 4.8–5.6)
Mean Plasma Glucose: 134 mg/dL

## 2020-01-23 LAB — HIV ANTIBODY (ROUTINE TESTING W REFLEX): HIV Screen 4th Generation wRfx: NONREACTIVE

## 2020-01-23 LAB — PREPARE RBC (CROSSMATCH)

## 2020-01-23 MED ORDER — SODIUM CHLORIDE 0.9% IV SOLUTION: Freq: Once | INTRAVENOUS | Status: DC

## 2020-01-23 MED ORDER — FUROSEMIDE 10 MG/ML IJ SOLN
40.0000 mg | Freq: Once | INTRAMUSCULAR | Status: AC
  Administered 2020-01-23: 40 mg via INTRAVENOUS
  Filled 2020-01-23: qty 4

## 2020-01-23 MED ORDER — CHLORHEXIDINE GLUCONATE CLOTH 2 % EX PADS
6.0000 | MEDICATED_PAD | Freq: Every day | CUTANEOUS | Status: DC
  Administered 2020-01-23 – 2020-01-24 (×2): 6 via TOPICAL

## 2020-01-23 MED ORDER — PNEUMOCOCCAL VAC POLYVALENT 25 MCG/0.5ML IJ INJ: 0.5000 mL | INJECTION | INTRAMUSCULAR | Status: DC

## 2020-01-23 MED ORDER — INFLUENZA VAC A&B SA ADJ QUAD 0.5 ML IM PRSY: 0.5000 mL | PREFILLED_SYRINGE | INTRAMUSCULAR | Status: DC

## 2020-01-23 NOTE — Consult Note (Signed)
Referring Provider: Roxan Hockey, MD Primary Care Physician:  [name], DO Primary Gastroenterologist:  Previously unassigned (now Dr. Gala Romney)  Reason for Consultation:  GI bleed  HPI: Savannah Burns is a 69 y.o. female with past medical history significant for hypertension, diabetes, COPD on home oxygen 5 L, chronic diastolic CHF, mild aortic stenosis with normal LVEF 70 to 75% with grade 1 diastolic dysfunction on ECHO in February 2021 who presented to the emergency department due to hemoglobin of 3.8.  She was seen by cardiology as an outpatient yesterday with complaints of lower extremity edema/fluid overload. Reporting increase in weight of 20 pounds. She was felt to have acute on chronic diastolic CHF in the setting of stopping her diuretics for about 6 months, recently resuming Lasix 1 month ago without significant improvement.  20 pounds overloaded.  Labs were checked revealing a hemoglobin of 3.8 and she was instructed to go to the emergency department.  Her hemoglobin have been normal 10 months ago at 13.1. BNP 118.  Patient has poor ambulation at baseline. Ambulates with assistance within the house. Daughter ,Caryl Pina, at bedside today.  States mother stopped taking all of her medicine for about 6 months.  And gotten to the point that it would take her 2-1/2 hours to take her mother to the bathroom to shower and back to the living room.  After resuming diuretics initially, she dropped about 20 pounds and was able to ambulate better throughout the home without significant shortness of breath. Over the past several weeks she started retaining fluid again and gained back all the fluid weight.  Patient requires assistance from her daughter for the majority of her care.  She has to have help with going to the bathroom.  Daughter states that stools have become much darker, sticky/pasty and very malodorous.  2-3 stools per day.  No fresh blood.  Denies vaginal bleeding.  No vomiting.   No heartburn, dysphagia, abdominal pain.  In the ED she had light brown stool in the rectal vault, heme positive.  Other labs include low normal B12 of 180, folate 8.3, iron 14, TIBC 473, iron saturation is 3%, ferritin 4.  Repeat hemoglobin in the ED was 3.7, hematocrit 15.3, MCV 72.2, white blood cell count 9000, platelets 184,000.  BUN 14, creatinine 0.69.  INR 1.1, BNP 149.    Chest x-ray yesterday showed mild CHF.  This morning her hemoglobin is up to 5.5, hematocrit 20.1.  She received 2 units of packed red blood cells.  BUN 13, creatinine 0.73, albumin 2.5 other LFTs normal.  No FH of colon cancer. Mother required frequent blood transfusion ("production issue not bleeding") and eventually refused transfusions later in life and passed. Patient has never had colonoscopy or upper endoscopy.  Takes aspirin 81 mg daily but denies other NSAIDs.   Prior to Admission medications   Medication Sig Start Date End Date Taking? Authorizing Provider  aspirin 81 MG tablet Take 81 mg by mouth daily. Reported on 06/01/2015   Yes [provider]  escitalopram (LEXAPRO) 20 MG tablet Take 1 tablet (20 mg total) by mouth daily. 10/22/19  Yes Gottschalk, Leatrice Jewels M, DO  hydrOXYzine (ATARAX/VISTARIL) 10 MG tablet Take 1 tablet (10 mg total) by mouth 3 (three) times daily as needed for anxiety. 07/02/18  Yes Gottschalk, Leatrice Jewels M, DO  lisinopril (ZESTRIL) 10 MG tablet Take 1 tablet (10 mg total) by mouth daily. 10/22/19  Yes Gottschalk, Ashly M, DO  lovastatin (MEVACOR) 20 MG tablet Take 1  tablet (20 mg total) by mouth at bedtime. 10/30/19  Yes Ronnie Doss M, DO  metFORMIN (GLUCOPHAGE) 1000 MG tablet Take 1 tablet (1,000 mg total) by mouth 2 (two) times daily with a meal. 01/22/20  Yes [name], PA-C  oxyCODONE-acetaminophen (PERCOCET) 10-325 MG tablet Take 1 tablet by mouth every 6 (six) hours as needed for pain.   Yes [provider]  Blood Glucose Calibration (GLUCOMETER DEX HIGH CONTROL)  LIQD Test CBG's once daily.  Patient not taking: Reported on 01/22/2020    [provider]  Blood Glucose Monitoring Suppl (TRUE METRIX AIR GLUCOSE METER) w/Device KIT Check BS daily Dx E11.9 10/02/19   Ronnie Doss M, DO  glucose blood (TRUE METRIX BLOOD GLUCOSE TEST) test strip Check BS daily Dx E11.9 10/02/19   Ronnie Doss M, DO  potassium chloride SA (KLOR-CON) 20 MEQ tablet Take 1 tablet (20 mEq total) by mouth 2 (two) times daily. 01/22/20   [name], PA-C  torsemide (DEMADEX) 20 MG tablet Take 2 tablets (40 mg total) by mouth 2 (two) times daily. 01/22/20   [name], PA-C  TRUEplus Lancets 30G MISC Check BS daily Dx E11.9 10/02/19   Ronnie Doss M, DO  albuterol (PROVENTIL HFA;VENTOLIN HFA) 108 (90 Base) MCG/ACT inhaler Inhale 2 puffs into the lungs every 6 (six) hours as needed for wheezing or shortness of breath. 09/21/16 09/22/16  Katheren Shams, DO    Current Facility-Administered Medications  Medication Dose Route Frequency Provider Last Rate Last Admin  . 0.9 %  sodium chloride infusion (Manually program via Guardrails IV Fluids)   Intravenous Once Emokpae, Courage, MD      . escitalopram (LEXAPRO) tablet 20 mg  20 mg Oral Daily Elgergawy, Silver Huguenin, MD      . furosemide (LASIX) injection 40 mg  40 mg Intravenous Q8H Elgergawy, Silver Huguenin, MD   40 mg at 01/23/20 0514  . furosemide (LASIX) injection 40 mg  40 mg Intravenous Once Emokpae, Courage, MD      . oxyCODONE-acetaminophen (PERCOCET/ROXICET) 5-325 MG per tablet 1 tablet  1 tablet Oral Q8H PRN Elgergawy, Silver Huguenin, MD       And  . oxyCODONE (Oxy IR/ROXICODONE) immediate release tablet 5 mg  5 mg Oral Q8H PRN Elgergawy, Silver Huguenin, MD      . pantoprazole (PROTONIX) 80 mg in sodium chloride 0.9 % 100 mL (0.8 mg/mL) infusion  8 mg/hr Intravenous Continuous Elgergawy, Silver Huguenin, MD 10 mL/hr at 01/22/20 2312 8 mg/hr at 01/22/20 2312  . [START ON 01/26/2020] pantoprazole (PROTONIX) injection 40 mg  40 mg  Intravenous Q12H Elgergawy, Silver Huguenin, MD      . pravastatin (PRAVACHOL) tablet 10 mg  10 mg Oral q1800 Elgergawy, Silver Huguenin, MD   10 mg at 01/22/20 2052   Current Outpatient Medications  Medication Sig Dispense Refill  . aspirin 81 MG tablet Take 81 mg by mouth daily. Reported on 06/01/2015    . escitalopram (LEXAPRO) 20 MG tablet Take 1 tablet (20 mg total) by mouth daily. 90 tablet 0  . hydrOXYzine (ATARAX/VISTARIL) 10 MG tablet Take 1 tablet (10 mg total) by mouth 3 (three) times daily as needed for anxiety. 60 tablet 2  . lisinopril (ZESTRIL) 10 MG tablet Take 1 tablet (10 mg total) by mouth daily. 90 tablet 0  . lovastatin (MEVACOR) 20 MG tablet Take 1 tablet (20 mg total) by mouth at bedtime. 90 tablet 3  . metFORMIN (GLUCOPHAGE) 1000 MG tablet Take  1 tablet (1,000 mg total) by mouth 2 (two) times daily with a meal. 180 tablet 3  . oxyCODONE-acetaminophen (PERCOCET) 10-325 MG tablet Take 1 tablet by mouth every 6 (six) hours as needed for pain.    . Blood Glucose Calibration (GLUCOMETER DEX HIGH CONTROL) LIQD Test CBG's once daily.  (Patient not taking: Reported on 01/22/2020)    . Blood Glucose Monitoring Suppl (TRUE METRIX AIR GLUCOSE METER) w/Device KIT Check BS daily Dx E11.9 1 kit 0  . glucose blood (TRUE METRIX BLOOD GLUCOSE TEST) test strip Check BS daily Dx E11.9 100 each 3  . potassium chloride SA (KLOR-CON) 20 MEQ tablet Take 1 tablet (20 mEq total) by mouth 2 (two) times daily. 60 tablet 3  . torsemide (DEMADEX) 20 MG tablet Take 2 tablets (40 mg total) by mouth 2 (two) times daily. 120 tablet 3  . TRUEplus Lancets 30G MISC Check BS daily Dx E11.9 100 each 3    Allergies as of 01/22/2020 - Review Complete 01/22/2020  Allergen Reaction Noted  . Keflex [cephalexin] Nausea And Vomiting 04/17/2019    Past Medical History:  Diagnosis Date  . Adrenal adenoma, left    Stable  . Anxiety   . Arthritis    bilateral hands  . CHF (congestive heart failure) ([place])   . COPD (chronic  obstructive pulmonary disease) ([place])   . Depression   . Diabetes mellitus without complication ([place])   . Dyspnea   . Grade II diastolic dysfunction   . History of kidney stones   . Hyperlipidemia   . Hypertension   . Lower back pain   . Panic attacks   . Pneumonia    currently taking antibiotic and prednisone for early stages of pneumonia  . Pulmonary nodules    bilateral  . Skin cancer    face    Past Surgical History:  Procedure Laterality Date  . Breast Cystectomy  Right   . CESAREAN SECTION    . CYSTOSCOPY/URETEROSCOPY/HOLMIUM LASER/STENT PLACEMENT Bilateral 03/01/2019   Procedure: CYSTOSCOPY/RETROGRADEURETEROSCOPY/HOLMIUM LASER/STENT PLACEMENT;  Surgeon: Ceasar Mons, MD;  Location: [facility];  Service: Urology;  Laterality: Bilateral;  ONLY NEEDS 60 MIN  . SKIN CANCER EXCISION     Face  . SPINE SURGERY      Family History  Problem Relation Age of Onset  . Diabetes Father   . Heart disease Father 70       MI  . Hypertension Father   . Anemia Mother        Transfusion dependent  . COPD Sister   . Cancer Paternal Grandmother 37       Pancreatic    Social History   Socioeconomic History  . Marital status: Widowed    Spouse name: Not on file  . Number of children: 2  . Years of education: 4  . Highest education level: Not on file  Occupational History  . Not on file  Tobacco Use  . Smoking status: Former Smoker    Packs/day: 1.50    Years: 40.00    Pack years: 60.00    Types: Cigarettes    Quit date: 04/29/2015    Years since quitting: 4.7  . Smokeless tobacco: Former Systems developer    Quit date: 04/29/2015  . Tobacco comment: Quit smoking 04/2015- Previous 1.5 ppd smoker  Vaping Use  . Vaping Use: Never used  Substance and Sexual Activity  . Alcohol use: No    Alcohol/week: 0.0 standard drinks  . Drug use: No  .  Sexual activity: Not Currently    Birth control/protection: Post-menopausal  Other Topics Concern  . Not on file  Social History  Narrative  . Not on file   Social Determinants of Health   Financial Resource Strain: Low Risk   . Difficulty of Paying Living Expenses: Not very hard  Food Insecurity:   . Worried About Charity fundraiser in the Last Year: Not on file  . Ran Out of Food in the Last Year: Not on file  Transportation Needs: No Transportation Needs  . Lack of Transportation (Medical): No  . Lack of Transportation (Non-Medical): No  Physical Activity: Inactive  . Days of Exercise per Week: 0 days  . Minutes of Exercise per Session: 0 min  Stress:   . Feeling of Stress : Not on file  Social Connections:   . Frequency of Communication with Friends and Family: Not on file  . Frequency of Social Gatherings with Friends and Family: Not on file  . Attends Religious Services: Not on file  . Active Member of Clubs or Organizations: Not on file  . Attends Archivist Meetings: Not on file  . Marital Status: Not on file  Intimate Partner Violence:   . Fear of Current or Ex-Partner: Not on file  . Emotionally Abused: Not on file  . Physically Abused: Not on file  . Sexually Abused: Not on file     ROS:  General: Negative for anorexia, weight loss, fever, chills, fatigue,+ weakness. Eyes: Negative for vision changes.  ENT: Negative for hoarseness, difficulty swallowing , nasal congestion. CV: Negative for chest pain, angina, palpitations, +dyspnea on exertion,+ peripheral edema.  Respiratory: Negative for dyspnea at rest, +dyspnea on exertion. no cough, sputum, wheezing.  GI: See history of present illness. GU:  Negative for dysuria, hematuria, urinary incontinence, urinary frequency, nocturnal urination.  MS: Negative for joint pain, low back pain. Limited mobility Derm: Negative for rash or itching.  Neuro: Negative for weakness, abnormal sensation, seizure, frequent headaches, memory loss, confusion.  Psych: Negative for anxiety, depression, suicidal ideation, hallucinations.  Endo: see  hpi Heme: Negative for bruising or bleeding. Allergy: Negative for rash or hives.       Physical Examination: Vital signs in last 24 hours: Temp:  [98.5 F (36.9 C)-99 F (37.2 C)] 98.5 F (36.9 C) (10/28 0216) Pulse Rate:  [83-104] 83 (10/28 0800) Resp:  [14-27] 14 (10/28 0800) BP: (102-143)/(36-103) 125/65 (10/28 0800) SpO2:  [95 %-100 %] 95 % (10/28 0800) Weight:  [140.1 kg-140.2 kg] 140.2 kg (10/27 1638)    General: Chronically ill-appearing morbidly obese female in no acute distress.  Daughter at bedside.    Head: Normocephalic, atraumatic.   Eyes: Conjunctiva pale, no icterus. Mouth: Oropharyngeal mucosa moist and pink , no lesions erythema or exudate. Neck: Supple without thyromegaly, masses, or lymphadenopathy.  Lungs: Clear to auscultation bilaterally.  Heart: Regular rate and rhythm, no murmurs rubs or gallops.  Abdomen: Bowel sounds are normal, nontender.  Obese, limits exam.  1-2+ pitting edema in the dependent portions of her abdomen particularly in the lower abdomen, no rebound or guarding.   Rectal: Not performed Extremities: 2+ edema to knees bilaterally.  No deformities Neuro: Alert and oriented x 4 , grossly normal neurologically.  Skin: Warm and dry, no rash or jaundice.   Psych: Alert and cooperative, normal mood and affect.        Intake/Output from previous day: 10/27 0701 - 10/28 0700 In: 730 [Blood:630; IV Piggyback:100] Out: 2700 [  Urine:2700] Intake/Output this shift: No intake/output data recorded.  Lab Results: CBC Recent Labs    01/22/20 1457 01/22/20 1639 01/23/20 0321  WBC 8.3 9.0 8.0  HGB 3.8* 3.7* 5.5*  HCT 15.6* 15.3* 20.1*  MCV 72.9* 72.2* 77.9*  PLT 182 184 176   BMET Recent Labs    01/22/20 1457 01/22/20 1639 01/23/20 0321  NA 135 135 135  K 3.7 3.6 4.1  CL 97* 96* 95*  CO2 32 31 33*  GLUCOSE 168* 143* 116*  BUN 13 14 13   CREATININE 0.61 0.69 0.73  CALCIUM 8.2* 8.2* 7.6*   LFT Recent Labs    01/22/20 1457  01/23/20 0321  BILITOT 0.5 0.9  ALKPHOS 64 65  AST 14* 15  ALT 8 8  PROT 7.6 7.5  ALBUMIN 2.5* 2.5*    Lipase No results for input(s): LIPASE in the last 72 hours.  PT/INR Recent Labs    01/22/20 1639  LABPROT 13.7  INR 1.1     Lab Results  Component Value Date   IRON 14 (L) 01/22/2020   TIBC 473 (H) 01/22/2020   FERRITIN 4 (L) 01/22/2020  ' Lab Results  Component Value Date   VITAMINB12 180 01/22/2020   Lab Results  Component Value Date   FOLATE 8.3 01/22/2020     Imaging Studies: DG Chest Portable 1 View  Result Date: 01/22/2020 CLINICAL DATA:  Dyspnea EXAM: PORTABLE CHEST 1 VIEW COMPARISON:  02/28/2019 chest radiograph. FINDINGS: Stable cardiomediastinal silhouette with mild cardiomegaly. No pneumothorax. No pleural effusion. Mild pulmonary edema. No acute consolidative airspace disease. IMPRESSION: Mild congestive heart failure. Electronically Signed   By: Ilona Sorrel M.D.   On: 01/22/2020 17:24  [4 week]   Impression: Pleasant 69 year old female with multiple comorbidities as outlined including acute on chronic diastolic heart failure, diabetes, hypertension, O2 dependent COPD presenting with profound symptomatic microcytic anemia.  Iron deficiency anemia: New diagnosis of profound IDA.  Hemoglobin normal 10 months ago.  Aspirin 81 mg daily but no other NSAIDs.  No prior colonoscopy.  Mother had some sort of transfusion dependent anemia (ultimately deceased due to refusing further transfusions) but family denies that she had chronic GI bleeding.  There is question of possible melena based on family's description of recent stools. She is heme positive.  BUN is normal. Consider upper GI source for bleeding but cannot rule out chronic occult bleeding anywhere along the GI tract given pronounced IDA.  Malignancy remains on the differential.  Patient will likely require colonoscopy and upper endoscopy this admission.  B12: Low normal.  Consider adding oral  supplement versus follow-up with PCP as an outpatient.  Plan: 1. Continue pantoprazole infusion for now. 2. Diuresis per attending. 3. Clear liquid diet ok. 4. Anticipate colonoscopy and upper endoscopy this admission once she is stabilized from a cardiopulmonary standpoint. 5. Transfuse to hemoglobin at least 7.  We would like to thank you for the opportunity to participate in the care of [name].  Laureen Ochs. Bernarda Caffey [facility] [phone] 10/28/202111:05 AM     LOS: 1 day

## 2020-01-23 NOTE — Progress Notes (Signed)
Patient Demographics:    Savannah Burns, is a 69 y.o. female, DOB - February 23, 1951, ZHG:[id number]  Admit date - 01/22/2020   Admitting Physician Albertine Patricia, MD  Outpatient Primary MD for the patient is [name], DO  LOS - 1   Chief Complaint  Patient presents with  . Abnormal Lab        Subjective:    [facility] today has no fevers, no emesis,  No chest pain,   --Complains of fatigue, -No bloody BM, -Dyspnea is improving after diuresis and transfusion  Assessment  & Plan :    Principal Problem:   Symptomatic anemia Active Problems:   Diabetes mellitus, type 2 (HCC)   COPD (chronic obstructive pulmonary disease) (HCC)   Chronic diastolic HF (heart failure) (HCC)   Hyperlipidemia associated with type 2 diabetes mellitus (HCC)   HYPERTENSION, BENIGN SYSTEMIC   Chronic dyspnea   ABLA (acute blood loss anemia)   Iron deficiency anemia  Brief Summary:- 69 y.o. female with past medical history significant for hypertension, diabetes, COPD on home oxygen 5 L, chronic diastolic CHF, mild aortic stenosis with normal LVEF 70 to 75% with grade 1 diastolic dysfunction admitted on 01/22/2020 with progressive dyspnea and found to have a hemoglobin of 3.8  A/p 1) acute on chronic symptomatic anemia--admission hemoglobin down to 3.7 patient received 2 units of PRBCs, hemoglobin is up to 5.5 -We will give additional 2 units of PRBCs -Continue IV Protonix, -GI consult appreciated possible colonoscopy and EGD on 01/24/2020 after further optimization -Patient with underlying chronic iron deficiency anemia--a lot more symptomatic now with dyspnea, fatigue and hypoxia-increased oxygen requirement  2) chronic hypoxic respiratory failure--- secondary to COPD and CHF, at baseline patient is on 5 L of oxygen, currently requiring up to 6 to 7 L of oxygen via nasal cannula -Continue oxygen  supplementation 3)DM2-A1c 6.3 reflecting excellent diabetic control PTA -Metformin on hold Use Novolog/Humalog Sliding scale insulin with Accu-Cheks/Fingersticks as ordered   4) acute on chronic diastolic dysfunction CHF/exacerbation---unfortunately PTA patient was not compliant with diuretics, she presented with some degree of volume overload this admission -continue aggressive diuresis with IV Lasix -Fluid input and output monitoring and daily weights  5)HTN-lisinopril on hold May use IV labetalol as needed elevated BP  6) COPD--no acute exacerbation at this time continue bronchodilators  Disposition/Need for in-Hospital Stay- patient unable to be discharged at this time due to -symptomatic anemia requiring transfusion of at least 4 units of PRBCs at this time, CHF/volume overload requiring IV diuresis with IV Lasix*  Status is: Inpatient  Remains inpatient appropriate because:symptomatic anemia requiring transfusion of at least 4 units of PRBCs at this time, CHF/volume overload requiring IV diuresis with IV Lasix*   Disposition: The patient is from: Home              Anticipated d/c is to: Home              Anticipated d/c date is: 2 days              Patient currently is not medically stable to d/c. Barriers: Not Clinically Stable- symptomatic anemia requiring transfusion of at least 4 units of PRBCs at this time, CHF/volume overload requiring IV diuresis with IV  Lasix*  Code Status : Full  Family Communication:    (patient is alert, awake and coherent)   Consults  :  Gi  DVT Prophylaxis  :  - SCDs   Lab Results  Component Value Date   PLT 176 01/23/2020    Inpatient Medications  Scheduled Meds: . sodium chloride   Intravenous Once  . Chlorhexidine Gluconate Cloth  6 each Topical Daily  . escitalopram  20 mg Oral Daily  . furosemide  40 mg Intravenous Q8H  . [START ON 01/24/2020] influenza vaccine adjuvanted  0.5 mL Intramuscular Tomorrow-1000  . [START ON  01/26/2020] pantoprazole  40 mg Intravenous Q12H  . [START ON 01/24/2020] pneumococcal 23 valent vaccine  0.5 mL Intramuscular Tomorrow-1000  . pravastatin  10 mg Oral q1800   Continuous Infusions: . pantoprozole (PROTONIX) infusion 8 mg/hr (01/23/20 1332)   PRN Meds:.oxyCODONE-acetaminophen **AND** oxyCODONE    Anti-infectives (From admission, onward)   None        Objective:   Vitals:   01/23/20 1415 01/23/20 1430 01/23/20 1500 01/23/20 1609  BP: (!) 132/45 (!) 151/82 (!) 120/52   Pulse: 89 86 84 83  Resp: (!) 21 19 20 18   Temp: 98.5 F (36.9 C) 98.7 F (37.1 C)  99 F (37.2 C)  TempSrc: Oral   Oral  SpO2: 99%  99% 99%  Weight:      Height:        Wt Readings from Last 3 Encounters:  01/23/20 (!) 137.5 kg  01/22/20 (!) 140.1 kg  08/20/19 (!) 140.1 kg     Intake/Output Summary (Last 24 hours) at 01/23/2020 1837 Last data filed at 01/23/2020 1822 Gross per 24 hour  Intake 1356.33 ml  Output 4400 ml  Net -3043.67 ml    Physical Exam  Gen:- Awake Alert, morbidly obese, conversational dyspnea HEENT:- [place].AT, No sclera icterus Nose- [place] 6L/min Neck-Supple Neck,No JVD,.  Lungs-diminished breath sounds with bibasilar rales, no wheezing CV- S1, S2 normal, regular  Abd-  +ve B.Sounds, Abd Soft, No tenderness,    Extremity/Skin:-+ve edema, pedal pulses present  Psych-affect is appropriate, oriented x3 Neuro-generalized weakness, no new focal deficits, no tremors   Data Review:   Micro Results Recent Results (from the past 240 hour(s))  Respiratory Panel by RT PCR (Flu A&B, Covid) - Nasopharyngeal Swab     Status: None   Collection Time: 01/22/20  4:40 PM   Specimen: Nasopharyngeal Swab  Result Value Ref Range Status   SARS Coronavirus 2 by RT PCR NEGATIVE NEGATIVE Final    Comment: (NOTE) SARS-CoV-2 target nucleic acids are NOT DETECTED.  The SARS-CoV-2 RNA is generally detectable in upper respiratoy specimens during the acute phase of infection. The  lowest concentration of SARS-CoV-2 viral copies this assay can detect is 131 copies/mL. A negative result does not preclude SARS-Cov-2 infection and should not be used as the sole basis for treatment or other patient management decisions. A negative result may occur with  improper specimen collection/handling, submission of specimen other than nasopharyngeal swab, presence of viral mutation(s) within the areas targeted by this assay, and inadequate number of viral copies (<131 copies/mL). A negative result must be combined with clinical observations, patient history, and epidemiological information. The expected result is Negative.  Fact Sheet for Patients:  PinkCheek.be  Fact Sheet for Healthcare Providers:  GravelBags.it  This test is no t yet approved or cleared by the [place] FDA and  has been authorized for detection and/or diagnosis of SARS-CoV-2 by FDA  under an Emergency Use Authorization (EUA). This EUA will remain  in effect (meaning this test can be used) for the duration of the COVID-19 declaration under Section 564(b)(1) of the Act, 21 U.S.C. section 360bbb-3(b)(1), unless the authorization is terminated or revoked sooner.     Influenza A by PCR NEGATIVE NEGATIVE Final   Influenza B by PCR NEGATIVE NEGATIVE Final    Comment: (NOTE) The Xpert Xpress SARS-CoV-2/FLU/RSV assay is intended as an aid in  the diagnosis of influenza from Nasopharyngeal swab specimens and  should not be used as a sole basis for treatment. Nasal washings and  aspirates are unacceptable for Xpert Xpress SARS-CoV-2/FLU/RSV  testing.  Fact Sheet for Patients: PinkCheek.be  Fact Sheet for Healthcare Providers: GravelBags.it  This test is not yet approved or cleared by the [place] FDA and  has been authorized for detection and/or diagnosis of SARS-CoV-2 by  FDA under  an Emergency Use Authorization (EUA). This EUA will remain  in effect (meaning this test can be used) for the duration of the  Covid-19 declaration under Section 564(b)(1) of the Act, 21  U.S.C. section 360bbb-3(b)(1), unless the authorization is  terminated or revoked. Performed at [facility], [address] [place], [place] [address]     Radiology Reports DG Chest Portable 1 View  Result Date: 01/22/2020 CLINICAL DATA:  Dyspnea EXAM: PORTABLE CHEST 1 VIEW COMPARISON:  02/28/2019 chest radiograph. FINDINGS: Stable cardiomediastinal silhouette with mild cardiomegaly. No pneumothorax. No pleural effusion. Mild pulmonary edema. No acute consolidative airspace disease. IMPRESSION: Mild congestive heart failure. Electronically Signed   By: Ilona Sorrel M.D.   On: 01/22/2020 17:24     CBC Recent Labs  Lab 01/22/20 1457 01/22/20 1639 01/23/20 0321  WBC 8.3 9.0 8.0  HGB 3.8* 3.7* 5.5*  HCT 15.6* 15.3* 20.1*  PLT 182 184 176  MCV 72.9* 72.2* 77.9*  MCH 17.8* 17.5* 21.3*  MCHC 24.4* 24.2* 27.4*  RDW 21.2* 21.5* 21.2*  LYMPHSABS  --  1.9  --   MONOABS  --  0.8  --   EOSABS  --  0.1  --   BASOSABS  --  0.0  --     Chemistries  Recent Labs  Lab 01/22/20 1457 01/22/20 1639 01/23/20 0321  NA 135 135 135  K 3.7 3.6 4.1  CL 97* 96* 95*  CO2 32 31 33*  GLUCOSE 168* 143* 116*  BUN 13 14 13   CREATININE 0.61 0.69 0.73  CALCIUM 8.2* 8.2* 7.6*  AST 14*  --  15  ALT 8  --  8  ALKPHOS 64  --  65  BILITOT 0.5  --  0.9   ------------------------------------------------------------------------------------------------------------------ No results for input(s): CHOL, HDL, LDLCALC, TRIG, CHOLHDL, LDLDIRECT in the last 72 hours.  Lab Results  Component Value Date   HGBA1C 6.3 (H) 01/22/2020   ------------------------------------------------------------------------------------------------------------------ No results for input(s): TSH, T4TOTAL, T3FREE, THYROIDAB in the last 72  hours.  Invalid input(s): FREET3 ------------------------------------------------------------------------------------------------------------------ Recent Labs    01/22/20 1639 01/22/20 1722  VITAMINB12 180  --   FOLATE 8.3  --   FERRITIN 4*  --   TIBC 473*  --   IRON 14*  --   RETICCTPCT  --  3.6*    Coagulation profile Recent Labs  Lab 01/22/20 1639  INR 1.1    No results for input(s): DDIMER in the last 72 hours.  Cardiac Enzymes No results for input(s): CKMB, TROPONINI, MYOGLOBIN in the last 168 hours.  Invalid input(s): CK ------------------------------------------------------------------------------------------------------------------  Component Value Date/Time   BNP 149.0 (H) 01/22/2020 1655    Roxan Hockey M.D on 01/23/2020 at 6:37 PM  Go to [email or web address] - for contact info  [facility] - Office  [phone]

## 2020-01-23 NOTE — ED Notes (Signed)
Patient still awake at this time. Patient repositioned in bed.

## 2020-01-24 DIAGNOSIS — D62 Acute posthemorrhagic anemia: Secondary | ICD-10-CM | POA: Diagnosis not present

## 2020-01-24 DIAGNOSIS — I509 Heart failure, unspecified: Secondary | ICD-10-CM | POA: Diagnosis not present

## 2020-01-24 DIAGNOSIS — R0609 Other forms of dyspnea: Secondary | ICD-10-CM

## 2020-01-24 DIAGNOSIS — D649 Anemia, unspecified: Secondary | ICD-10-CM | POA: Diagnosis not present

## 2020-01-24 DIAGNOSIS — R195 Other fecal abnormalities: Secondary | ICD-10-CM

## 2020-01-24 DIAGNOSIS — I5032 Chronic diastolic (congestive) heart failure: Secondary | ICD-10-CM

## 2020-01-24 DIAGNOSIS — D509 Iron deficiency anemia, unspecified: Secondary | ICD-10-CM

## 2020-01-24 LAB — GLUCOSE, CAPILLARY
Glucose-Capillary: 96 mg/dL (ref 70–99)
Glucose-Capillary: 115 mg/dL — ABNORMAL HIGH (ref 70–99)
Glucose-Capillary: 110 mg/dL — ABNORMAL HIGH (ref 70–99)
Glucose-Capillary: 154 mg/dL — ABNORMAL HIGH (ref 70–99)
Glucose-Capillary: 95 mg/dL (ref 70–99)

## 2020-01-24 LAB — CBC
HCT: 24.6 % — ABNORMAL LOW (ref 36.0–46.0)
Hemoglobin: 7 g/dL — ABNORMAL LOW (ref 12.0–15.0)
MCH: 22.9 pg — ABNORMAL LOW (ref 26.0–34.0)
MCHC: 28.5 g/dL — ABNORMAL LOW (ref 30.0–36.0)
MCV: 80.4 fL (ref 80.0–100.0)
Platelets: 154 10*3/uL (ref 150–400)
RBC: 3.06 MIL/uL — ABNORMAL LOW (ref 3.87–5.11)
RDW: 21.7 % — ABNORMAL HIGH (ref 11.5–15.5)
WBC: 7.9 10*3/uL (ref 4.0–10.5)
nRBC: 0 % (ref 0.0–0.2)

## 2020-01-24 LAB — BASIC METABOLIC PANEL
Anion gap: 7 (ref 5–15)
BUN: 12 mg/dL (ref 8–23)
CO2: 33 mmol/L — ABNORMAL HIGH (ref 22–32)
Calcium: 8 mg/dL — ABNORMAL LOW (ref 8.9–10.3)
Chloride: 94 mmol/L — ABNORMAL LOW (ref 98–111)
Creatinine, Ser: 0.66 mg/dL (ref 0.44–1.00)
GFR, Estimated: >60 mL/min (ref >60)
Glucose, Bld: 114 mg/dL — ABNORMAL HIGH (ref 70–99)
Potassium: 3.4 mmol/L — ABNORMAL LOW (ref 3.5–5.1)
Sodium: 134 mmol/L — ABNORMAL LOW (ref 135–145)

## 2020-01-24 LAB — MRSA PCR SCREENING: MRSA by PCR: NEGATIVE

## 2020-01-24 LAB — PREPARE RBC (CROSSMATCH)

## 2020-01-24 MED ORDER — PEG 3350-KCL-NA BICARB-NACL 420 G PO SOLR
4000.0000 mL | Freq: Once | ORAL | Status: AC
  Administered 2020-01-24: 4000 mL via ORAL

## 2020-01-24 MED ORDER — PNEUMOCOCCAL VAC POLYVALENT 25 MCG/0.5ML IJ INJ: 0.5000 mL | INJECTION | INTRAMUSCULAR | Status: DC

## 2020-01-24 MED ORDER — SODIUM CHLORIDE 0.9% IV SOLUTION: Freq: Once | INTRAVENOUS | Status: AC

## 2020-01-24 MED ORDER — POLYVINYL ALCOHOL 1.4 % OP SOLN
1.0000 [drp] | OPHTHALMIC | Status: DC | PRN
  Administered 2020-01-25: 1 [drp] via OPHTHALMIC
  Filled 2020-01-24: qty 15

## 2020-01-24 MED ORDER — INFLUENZA VAC A&B SA ADJ QUAD 0.5 ML IM PRSY: 0.5000 mL | PREFILLED_SYRINGE | INTRAMUSCULAR | Status: DC

## 2020-01-24 MED ORDER — POTASSIUM CHLORIDE CRYS ER 20 MEQ PO TBCR
40.0000 meq | EXTENDED_RELEASE_TABLET | ORAL | Status: AC
  Administered 2020-01-24 (×2): 40 meq via ORAL
  Filled 2020-01-24 (×2): qty 2

## 2020-01-24 NOTE — Progress Notes (Signed)
Patient Demographics:    Savannah Burns, is a 69 y.o. female, DOB - 21-Feb-1951, PVV:[id number]  Admit date - 01/22/2020   Admitting Physician Albertine Patricia, MD  Outpatient Primary MD for the patient is [name], DO  LOS - 2   Chief Complaint  Patient presents with  . Abnormal Lab        Subjective:    [facility] today has no fevers, no emesis, No chest pain,   -Complains of hunger wants to eat more -No bloody BMs  Assessment  & Plan :    Principal Problem:   Symptomatic anemia Active Problems:   Diabetes mellitus, type 2 (HCC)   COPD (chronic obstructive pulmonary disease) (HCC)   Chronic diastolic HF (heart failure) (HCC)   Hyperlipidemia associated with type 2 diabetes mellitus (HCC)   HYPERTENSION, BENIGN SYSTEMIC   Chronic dyspnea   ABLA (acute blood loss anemia)   Iron deficiency anemia   Acute on chronic congestive heart failure (HCC)  Brief Summary:- 69 y.o. female with past medical history significant for hypertension, diabetes, COPD on home oxygen 5 L, chronic diastolic CHF, mild aortic stenosis with normal LVEF 70 to 75% with grade 1 diastolic dysfunction admitted on 01/22/2020 with progressive dyspnea and found to have a hemoglobin of 3.8  A/p 1) acute on chronic symptomatic anemia--admission hemoglobin down to 3.7 patient received 2 units of PRBCs, hemoglobin is up to 5.5 -Patient received additional 2 units of PRBCs--  --Hgb is up to 7.0 after a total of 4 units of PRBCs -GI recommends another 1 unit of PRBCs on 01/24/2020 -Continue IV Protonix, -GI consult appreciated possible colonoscopy and EGD on 01/25/2020 after further optimization -Patient with underlying chronic iron deficiency anemia--a lot more symptomatic now with dyspnea, fatigue and hypoxia-increased oxygen requirement  2) chronic hypoxic respiratory failure--- secondary to COPD and CHF,  at baseline patient is on 5 L of oxygen,  -Continue oxygen supplementation  3)DM2-A1c 6.3 reflecting excellent diabetic control PTA -Metformin on hold Use Novolog/Humalog Sliding scale insulin with Accu-Cheks/Fingersticks as ordered   4) acute on chronic diastolic dysfunction CHF/exacerbation---unfortunately PTA patient was not compliant with diuretics, she presented with some degree of volume overload this admission -continue aggressive diuresis with IV Lasix -Fluid input and output monitoring and daily weights  5)HTN-lisinopril on hold May use IV labetalol as needed elevated BP  6) COPD--no acute exacerbation at this time continue bronchodilators  Disposition/Need for in-Hospital Stay- patient unable to be discharged at this time due to -symptomatic anemia requiring transfusion of at least 5 units of PRBCs at this time, CHF/volume overload requiring IV diuresis with IV Lasix* -Awaiting EGD/colonoscopy on 01/25/2020  Status is: Inpatient  Remains inpatient appropriate because:See above   Disposition: The patient is from: Home              Anticipated d/c is to: Home              Anticipated d/c date is: 2 days              Patient currently is not medically stable to d/c. Barriers: Not Clinically Stable- symptomatic anemia requiring transfusion of at least 5 units of PRBCs at this time, CHF/volume overload requiring IV diuresis with IV  Lasix -Awaiting EGD/colonoscopy on 01/25/2020  Code Status : Full  Family Communication:    (patient is alert, awake and coherent)   Consults  :  Gi  DVT Prophylaxis  :  - SCDs   Lab Results  Component Value Date   PLT 154 01/24/2020    Inpatient Medications  Scheduled Meds: . sodium chloride   Intravenous Once  . Chlorhexidine Gluconate Cloth  6 each Topical Daily  . escitalopram  20 mg Oral Daily  . furosemide  40 mg Intravenous Q8H  . [START ON 01/25/2020] influenza vaccine adjuvanted  0.5 mL Intramuscular Tomorrow-1000  . [START  ON 01/26/2020] pantoprazole  40 mg Intravenous Q12H  . [START ON 01/25/2020] pneumococcal 23 valent vaccine  0.5 mL Intramuscular Tomorrow-1000  . potassium chloride  40 mEq Oral Q3H  . pravastatin  10 mg Oral q1800   Continuous Infusions: . pantoprozole (PROTONIX) infusion 8 mg/hr (01/24/20 1619)   PRN Meds:.oxyCODONE-acetaminophen **AND** oxyCODONE, polyvinyl alcohol    Anti-infectives (From admission, onward)   None        Objective:   Vitals:   01/24/20 1300 01/24/20 1400 01/24/20 1425 01/24/20 1557  BP: (!) 119/57 130/75    Pulse: 83 85 81   Resp: 20 (!) 21 (!) 23   Temp:   99.1 F (37.3 C) 99.1 F (37.3 C)  TempSrc:   Oral Oral  SpO2: 98% 99% 100%   Weight:      Height:        Wt Readings from Last 3 Encounters:  01/24/20 (!) 136.8 kg  01/22/20 (!) 140.1 kg  08/20/19 (!) 140.1 kg     Intake/Output Summary (Last 24 hours) at 01/24/2020 1847 Last data filed at 01/24/2020 1751 Gross per 24 hour  Intake 948.41 ml  Output 3100 ml  Net -2151.59 ml    Physical Exam  Gen:- Awake Alert, morbidly obese, conversational dyspnea HEENT:- [place].AT, No sclera icterus Nose- [place] 5L/min Neck-Supple Neck,No JVD,.  Lungs-diminished breath sounds with bibasilar rales, no wheezing CV- S1, S2 normal, regular  Abd-  +ve B.Sounds, Abd Soft, No tenderness,    Extremity/Skin:-+ve edema, pedal pulses present  Psych-affect is appropriate, oriented x3 Neuro-generalized weakness, no new focal deficits, no tremors   Data Review:   Micro Results Recent Results (from the past 240 hour(s))  Respiratory Panel by RT PCR (Flu A&B, Covid) - Nasopharyngeal Swab     Status: None   Collection Time: 01/22/20  4:40 PM   Specimen: Nasopharyngeal Swab  Result Value Ref Range Status   SARS Coronavirus 2 by RT PCR NEGATIVE NEGATIVE Final    Comment: (NOTE) SARS-CoV-2 target nucleic acids are NOT DETECTED.  The SARS-CoV-2 RNA is generally detectable in upper respiratoy specimens during the  acute phase of infection. The lowest concentration of SARS-CoV-2 viral copies this assay can detect is 131 copies/mL. A negative result does not preclude SARS-Cov-2 infection and should not be used as the sole basis for treatment or other patient management decisions. A negative result may occur with  improper specimen collection/handling, submission of specimen other than nasopharyngeal swab, presence of viral mutation(s) within the areas targeted by this assay, and inadequate number of viral copies (<131 copies/mL). A negative result must be combined with clinical observations, patient history, and epidemiological information. The expected result is Negative.  Fact Sheet for Patients:  PinkCheek.be  Fact Sheet for Healthcare Providers:  GravelBags.it  This test is no t yet approved or cleared by the [place] and  has been authorized for detection and/or diagnosis of SARS-CoV-2 by FDA under an Emergency Use Authorization (EUA). This EUA will remain  in effect (meaning this test can be used) for the duration of the COVID-19 declaration under Section 564(b)(1) of the Act, 21 U.S.C. section 360bbb-3(b)(1), unless the authorization is terminated or revoked sooner.     Influenza A by PCR NEGATIVE NEGATIVE Final   Influenza B by PCR NEGATIVE NEGATIVE Final    Comment: (NOTE) The Xpert Xpress SARS-CoV-2/FLU/RSV assay is intended as an aid in  the diagnosis of influenza from Nasopharyngeal swab specimens and  should not be used as a sole basis for treatment. Nasal washings and  aspirates are unacceptable for Xpert Xpress SARS-CoV-2/FLU/RSV  testing.  Fact Sheet for Patients: PinkCheek.be  Fact Sheet for Healthcare Providers: GravelBags.it  This test is not yet approved or cleared by the [place] FDA and  has been authorized for detection and/or diagnosis  of SARS-CoV-2 by  FDA under an Emergency Use Authorization (EUA). This EUA will remain  in effect (meaning this test can be used) for the duration of the  Covid-19 declaration under Section 564(b)(1) of the Act, 21  U.S.C. section 360bbb-3(b)(1), unless the authorization is  terminated or revoked. Performed at [facility] [address], [place], [place] [address]   MRSA PCR Screening     Status: None   Collection Time: 01/23/20  1:57 PM   Specimen: Nasal Mucosa; Nasopharyngeal  Result Value Ref Range Status   MRSA by PCR NEGATIVE NEGATIVE Final    Comment:        The GeneXpert MRSA Assay (FDA approved for NASAL specimens only), is one component of a comprehensive MRSA colonization surveillance program. It is not intended to diagnose MRSA infection nor to guide or monitor treatment for MRSA infections. Performed at [facility], [address], [place], [place] [address]     Radiology Reports DG Chest Portable 1 View  Result Date: 01/22/2020 CLINICAL DATA:  Dyspnea EXAM: PORTABLE CHEST 1 VIEW COMPARISON:  02/28/2019 chest radiograph. FINDINGS: Stable cardiomediastinal silhouette with mild cardiomegaly. No pneumothorax. No pleural effusion. Mild pulmonary edema. No acute consolidative airspace disease. IMPRESSION: Mild congestive heart failure. Electronically Signed   By: Ilona Sorrel M.D.   On: 01/22/2020 17:24     CBC Recent Labs  Lab 01/22/20 1457 01/22/20 1639 01/23/20 0321 01/24/20 0820  WBC 8.3 9.0 8.0 7.9  HGB 3.8* 3.7* 5.5* 7.0*  HCT 15.6* 15.3* 20.1* 24.6*  PLT 182 184 176 154  MCV 72.9* 72.2* 77.9* 80.4  MCH 17.8* 17.5* 21.3* 22.9*  MCHC 24.4* 24.2* 27.4* 28.5*  RDW 21.2* 21.5* 21.2* 21.7*  LYMPHSABS  --  1.9  --   --   MONOABS  --  0.8  --   --   EOSABS  --  0.1  --   --   BASOSABS  --  0.0  --   --     Chemistries  Recent Labs  Lab 01/22/20 1457 01/22/20 1639 01/23/20 0321 01/24/20 0820  NA 135 135 135 134*  K 3.7 3.6 4.1 3.4*  CL 97* 96* 95*  94*  CO2 32 31 33* 33*  GLUCOSE 168* 143* 116* 114*  BUN 13 14 13 12   CREATININE 0.61 0.69 0.73 0.66  CALCIUM 8.2* 8.2* 7.6* 8.0*  AST 14*  --  15  --   ALT 8  --  8  --   ALKPHOS 64  --  65  --  BILITOT 0.5  --  0.9  --    ------------------------------------------------------------------------------------------------------------------ No results for input(s): CHOL, HDL, LDLCALC, TRIG, CHOLHDL, LDLDIRECT in the last 72 hours.  Lab Results  Component Value Date   HGBA1C 6.3 (H) 01/22/2020   ------------------------------------------------------------------------------------------------------------------ No results for input(s): TSH, T4TOTAL, T3FREE, THYROIDAB in the last 72 hours.  Invalid input(s): FREET3 ------------------------------------------------------------------------------------------------------------------ Recent Labs    01/22/20 1639 01/22/20 1722  VITAMINB12 180  --   FOLATE 8.3  --   FERRITIN 4*  --   TIBC 473*  --   IRON 14*  --   RETICCTPCT  --  3.6*    Coagulation profile Recent Labs  Lab 01/22/20 1639  INR 1.1    No results for input(s): DDIMER in the last 72 hours.  Cardiac Enzymes No results for input(s): CKMB, TROPONINI, MYOGLOBIN in the last 168 hours.  Invalid input(s): CK ------------------------------------------------------------------------------------------------------------------    Component Value Date/Time   BNP 149.0 (H) 01/22/2020 1655    Roxan Hockey M.D on 01/24/2020 at 6:47 PM  Go to [email or web address] - for contact info  [facility] - Office  [phone]

## 2020-01-24 NOTE — Progress Notes (Addendum)
Subjective: States she's tired this morning. Denies abdominal pain, N/V. Had a bowel movement yesterday but didn't look at it. (Per nursing no hematochezia or melena). No other GI complaints. Wants her procedures done while inpatient; wants to go home.  Objective: Vital signs in last 24 hours: Temp:  [97.9 F (36.6 C)-99 F (37.2 C)] 98.4 F (36.9 C) (10/29 0756) Pulse Rate:  [81-90] 87 (10/29 0700) Resp:  [17-36] 36 (10/29 0700) BP: (102-170)/(39-89) 160/73 (10/29 0700) SpO2:  [93 %-100 %] 97 % (10/29 0700) FiO2 (%):  [28 %] 28 % (10/28 2000) Weight:  [136.8 kg-137.5 kg] 136.8 kg (10/29 0500)   General:   Alert and oriented, pleasant but sleepy Head:  Normocephalic and atraumatic. Eyes:  No icterus, sclera clear. Conjuctiva pink.  Heart:  S1, S2 present, no murmurs noted.  Lungs: Clear to auscultation bilaterally, without wheezing, rales, or rhonchi.  Abdomen:  Bowel sounds present, rounded but soft, non-tender, non-distended. No HSM or hernias noted. No rebound or guarding. No masses appreciated  Msk:  Symmetrical without gross deformities. Pulses:  Normal bilateral DP pulses noted. Extremities:  Without clubbing or edema. Neurologic:  Alert and  oriented x4;  grossly normal neurologically. Psych:  Alert and cooperative. Normal mood and affect.  Intake/Output from previous day: 10/28 0701 - 10/29 0700 In: 869.7 [I.V.:243.4; Blood:626.3] Out: 1700 [Urine:1700] Intake/Output this shift: No intake/output data recorded.  Lab Results: Recent Labs    01/22/20 1457 01/22/20 1639 01/23/20 0321  WBC 8.3 9.0 8.0  HGB 3.8* 3.7* 5.5*  HCT 15.6* 15.3* 20.1*  PLT 182 184 176   BMET Recent Labs    01/22/20 1457 01/22/20 1639 01/23/20 0321  NA 135 135 135  K 3.7 3.6 4.1  CL 97* 96* 95*  CO2 32 31 33*  GLUCOSE 168* 143* 116*  BUN 13 14 13   CREATININE 0.61 0.69 0.73  CALCIUM 8.2* 8.2* 7.6*   LFT Recent Labs    01/22/20 1457 01/23/20 0321  PROT 7.6 7.5  ALBUMIN  2.5* 2.5*  AST 14* 15  ALT 8 8  ALKPHOS 64 65  BILITOT 0.5 0.9   PT/INR Recent Labs    01/22/20 1639  LABPROT 13.7  INR 1.1   Hepatitis Panel No results for input(s): HEPBSAG, HCVAB, HEPAIGM, HEPBIGM in the last 72 hours.   Studies/Results: DG Chest Portable 1 View  Result Date: 01/22/2020 CLINICAL DATA:  Dyspnea EXAM: PORTABLE CHEST 1 VIEW COMPARISON:  02/28/2019 chest radiograph. FINDINGS: Stable cardiomediastinal silhouette with mild cardiomegaly. No pneumothorax. No pleural effusion. Mild pulmonary edema. No acute consolidative airspace disease. IMPRESSION: Mild congestive heart failure. Electronically Signed   By: Ilona Sorrel M.D.   On: 01/22/2020 17:24    Assessment: Pleasant 69 year old female with multiple comorbidities as outlined including acute on chronic diastolic heart failure, diabetes, hypertension, O2 dependent COPD presenting with profound symptomatic microcytic anemia.  Iron deficiency anemia - New diagnosis of profound IDA.  Hemoglobin normal 10 months ago.  Aspirin 81 mg daily but no other NSAIDs.  No prior colonoscopy.  Mother had some sort of transfusion dependent anemia (ultimately deceased due to refusing further transfusions) but family denies that she had chronic GI bleeding.  There is question of possible melena based on family's description of recent stools. She was heme positive.  BUN is normal. Consider upper GI source for bleeding but cannot rule out chronic occult bleeding anywhere along the GI tract given pronounced IDA.  Malignancy remains in the differential.  Patient was  transfused 2 units PRBC and hgb increased to 5.5 on 10/28 at 0300; received additional 2 units yesterday but no post-transfusion CBC, follow-up labs ordered today, not drawn yet. Vitals are stable otherwise. BMP for electrolytes pending this morning.   This morning she is sleepy but arousable and appropriate. She didn't look at her stool yesterday, nursing denies any GI bleed  overnight. Satting well on 2 lpm (down from 6-7 lpm yesterday). I/Os: negative 830 mL in 24 hours despite blood transfusions x 4.  B12 Deficiency - Low normal.  Consider adding oral supplement versus follow-up with PCP as an outpatient.   Plan: 1. EGD versus double today; will discuss with Dr. Abbey Chatters (afraid inadequate bowel prep time due to waiting for hgb results) 2. Monitor hgb closely 3. Monitor for further GI bleed 4. Transfuse as necessary 5. Supportive measures   Thank you for allowing [place] to participate in the care of [place], DNP, AGNP-C Adult & Gerontological Nurse Practitioner [facility]    LOS: 2 days    01/24/2020, 8:52 AM

## 2020-01-24 NOTE — H&P (View-Only) (Signed)
Subjective: States she's tired this morning. Denies abdominal pain, N/V. Had a bowel movement yesterday but didn't look at it. (Per nursing no hematochezia or melena). No other GI complaints. Wants her procedures done while inpatient; wants to go home.  Objective: Vital signs in last 24 hours: Temp:  [97.9 F (36.6 C)-99 F (37.2 C)] 98.4 F (36.9 C) (10/29 0756) Pulse Rate:  [81-90] 87 (10/29 0700) Resp:  [17-36] 36 (10/29 0700) BP: (102-170)/(39-89) 160/73 (10/29 0700) SpO2:  [93 %-100 %] 97 % (10/29 0700) FiO2 (%):  [28 %] 28 % (10/28 2000) Weight:  [136.8 kg-137.5 kg] 136.8 kg (10/29 0500)   General:   Alert and oriented, pleasant but sleepy Head:  Normocephalic and atraumatic. Eyes:  No icterus, sclera clear. Conjuctiva pink.  Heart:  S1, S2 present, no murmurs noted.  Lungs: Clear to auscultation bilaterally, without wheezing, rales, or rhonchi.  Abdomen:  Bowel sounds present, rounded but soft, non-tender, non-distended. No HSM or hernias noted. No rebound or guarding. No masses appreciated  Msk:  Symmetrical without gross deformities. Pulses:  Normal bilateral DP pulses noted. Extremities:  Without clubbing or edema. Neurologic:  Alert and  oriented x4;  grossly normal neurologically. Psych:  Alert and cooperative. Normal mood and affect.  Intake/Output from previous day: 10/28 0701 - 10/29 0700 In: 869.7 [I.V.:243.4; Blood:626.3] Out: 1700 [Urine:1700] Intake/Output this shift: No intake/output data recorded.  Lab Results: Recent Labs    01/22/20 1457 01/22/20 1639 01/23/20 0321  WBC 8.3 9.0 8.0  HGB 3.8* 3.7* 5.5*  HCT 15.6* 15.3* 20.1*  PLT 182 184 176   BMET Recent Labs    01/22/20 1457 01/22/20 1639 01/23/20 0321  NA 135 135 135  K 3.7 3.6 4.1  CL 97* 96* 95*  CO2 32 31 33*  GLUCOSE 168* 143* 116*  BUN 13 14 13   CREATININE 0.61 0.69 0.73  CALCIUM 8.2* 8.2* 7.6*   LFT Recent Labs    01/22/20 1457 01/23/20 0321  PROT 7.6 7.5  ALBUMIN  2.5* 2.5*  AST 14* 15  ALT 8 8  ALKPHOS 64 65  BILITOT 0.5 0.9   PT/INR Recent Labs    01/22/20 1639  LABPROT 13.7  INR 1.1   Hepatitis Panel No results for input(s): HEPBSAG, HCVAB, HEPAIGM, HEPBIGM in the last 72 hours.   Studies/Results: DG Chest Portable 1 View  Result Date: 01/22/2020 CLINICAL DATA:  Dyspnea EXAM: PORTABLE CHEST 1 VIEW COMPARISON:  02/28/2019 chest radiograph. FINDINGS: Stable cardiomediastinal silhouette with mild cardiomegaly. No pneumothorax. No pleural effusion. Mild pulmonary edema. No acute consolidative airspace disease. IMPRESSION: Mild congestive heart failure. Electronically Signed   By: Ilona Sorrel M.D.   On: 01/22/2020 17:24    Assessment: Pleasant 69 year old female with multiple comorbidities as outlined including acute on chronic diastolic heart failure, diabetes, hypertension, O2 dependent COPD presenting with profound symptomatic microcytic anemia.  Iron deficiency anemia - New diagnosis of profound IDA.  Hemoglobin normal 10 months ago.  Aspirin 81 mg daily but no other NSAIDs.  No prior colonoscopy.  Mother had some sort of transfusion dependent anemia (ultimately deceased due to refusing further transfusions) but family denies that she had chronic GI bleeding.  There is question of possible melena based on family's description of recent stools. She was heme positive.  BUN is normal. Consider upper GI source for bleeding but cannot rule out chronic occult bleeding anywhere along the GI tract given pronounced IDA.  Malignancy remains in the differential.  Patient was  transfused 2 units PRBC and hgb increased to 5.5 on 10/28 at 0300; received additional 2 units yesterday but no post-transfusion CBC, follow-up labs ordered today, not drawn yet. Vitals are stable otherwise. BMP for electrolytes pending this morning.   This morning she is sleepy but arousable and appropriate. She didn't look at her stool yesterday, nursing denies any GI bleed  overnight. Satting well on 2 lpm (down from 6-7 lpm yesterday). I/Os: negative 830 mL in 24 hours despite blood transfusions x 4.  B12 Deficiency - Low normal.  Consider adding oral supplement versus follow-up with PCP as an outpatient.   Plan: 1. EGD versus double today; will discuss with Dr. Abbey Chatters (afraid inadequate bowel prep time due to waiting for hgb results) 2. Monitor hgb closely 3. Monitor for further GI bleed 4. Transfuse as necessary 5. Supportive measures   Thank you for allowing [place] to participate in the care of [place], DNP, AGNP-C Adult & Gerontological Nurse Practitioner [facility]    LOS: 2 days    01/24/2020, 8:52 AM

## 2020-01-24 NOTE — Progress Notes (Addendum)
Labs have come back. Hgb improved at 7.0 (3.7 -> 5.5 -> 7.0) Plt normal. WBC normal. BMP with mild hypokalemia (3.4) and hypocalcemia (8.0; no albumin to correct). Cr normal.  Appears stable for endoscopic evaluation though hgb is still a little lower than optimal. Discussed with Dr. Abbey Chatters. Will give 1 more unit of PRBC, recheck CBC this evening and further blood products per hospitalist pending lab results.  Plan for double (EGD and colonoscopy) tomorrow. Will prep this evening. Clear liquids today, NPO after midnight. GoLytely this evening/night, tap water enema x 2 on call.  Proceed with EGD and colonoscopy on propofol/MAC with Dr. Abbey Chatters on propofol/MAC in near future: the risks, benefits, and alternatives have been discussed with the patient in detail. The patient states understanding and desires to proceed.  ASA III/IV   Thank you for allowing [place] to participate in the care of [place], DNP, AGNP-C Adult & Gerontological Nurse Practitioner [facility]

## 2020-01-25 ENCOUNTER — Encounter (HOSPITAL_COMMUNITY)
Admission: EM | Disposition: A | Discharge status: Home Health | Payer: Self-pay | Source: Ambulatory Visit | Attending: Family Medicine

## 2020-01-25 ENCOUNTER — Encounter (HOSPITAL_COMMUNITY): Payer: Self-pay | Admitting: Internal Medicine

## 2020-01-25 ENCOUNTER — Other Ambulatory Visit: Payer: Self-pay

## 2020-01-25 ENCOUNTER — Inpatient Hospital Stay (HOSPITAL_COMMUNITY): Payer: Medicare PPO

## 2020-01-25 ENCOUNTER — Inpatient Hospital Stay (HOSPITAL_COMMUNITY): Payer: Medicare PPO | Admitting: Anesthesiology

## 2020-01-25 DIAGNOSIS — K635 Polyp of colon: Principal | ICD-10-CM

## 2020-01-25 DIAGNOSIS — I8510 Secondary esophageal varices without bleeding: Secondary | ICD-10-CM

## 2020-01-25 DIAGNOSIS — I10 Essential (primary) hypertension: Secondary | ICD-10-CM

## 2020-01-25 DIAGNOSIS — E1169 Type 2 diabetes mellitus with other specified complication: Secondary | ICD-10-CM

## 2020-01-25 DIAGNOSIS — K7469 Other cirrhosis of liver: Secondary | ICD-10-CM

## 2020-01-25 DIAGNOSIS — I8500 Esophageal varices without bleeding: Secondary | ICD-10-CM

## 2020-01-25 HISTORY — PX: POLYPECTOMY: SHX5525

## 2020-01-25 HISTORY — PX: SUBMUCOSAL TATTOO INJECTION: SHX6856

## 2020-01-25 HISTORY — PX: ESOPHAGOGASTRODUODENOSCOPY (EGD) WITH PROPOFOL: SHX5813

## 2020-01-25 HISTORY — PX: COLONOSCOPY WITH PROPOFOL: SHX5780

## 2020-01-25 LAB — BPAM RBC
Blood Product Expiration Date: 202110312359
Blood Product Expiration Date: 202111012359
Blood Product Expiration Date: 202111122359
Blood Product Expiration Date: 202112042359
Blood Product Expiration Date: 202112042359
ISSUE DATE / TIME: 202110272016
ISSUE DATE / TIME: 202110272321
ISSUE DATE / TIME: 202110281016
ISSUE DATE / TIME: 202110281407
ISSUE DATE / TIME: 202110291205
Unit Type and Rh: 5100
Unit Type and Rh: 5100
Unit Type and Rh: 600
Unit Type and Rh: 600
Unit Type and Rh: 6200

## 2020-01-25 LAB — CBC
HCT: 27.1 % — ABNORMAL LOW (ref 36.0–46.0)
Hemoglobin: 7.8 g/dL — ABNORMAL LOW (ref 12.0–15.0)
MCH: 23.8 pg — ABNORMAL LOW (ref 26.0–34.0)
MCHC: 28.8 g/dL — ABNORMAL LOW (ref 30.0–36.0)
MCV: 82.6 fL (ref 80.0–100.0)
Platelets: 160 10*3/uL (ref 150–400)
RBC: 3.28 MIL/uL — ABNORMAL LOW (ref 3.87–5.11)
RDW: 22.2 % — ABNORMAL HIGH (ref 11.5–15.5)
WBC: 8.6 10*3/uL (ref 4.0–10.5)
nRBC: 0 % (ref 0.0–0.2)

## 2020-01-25 LAB — TYPE AND SCREEN
ABO/RH(D): A POS
Antibody Screen: NEGATIVE
Unit division: 0
Unit division: 0
Unit division: 0
Unit division: 0
Unit division: 0

## 2020-01-25 LAB — GLUCOSE, CAPILLARY
Glucose-Capillary: 105 mg/dL — ABNORMAL HIGH (ref 70–99)
Glucose-Capillary: 105 mg/dL — ABNORMAL HIGH (ref 70–99)
Glucose-Capillary: 117 mg/dL — ABNORMAL HIGH (ref 70–99)
Glucose-Capillary: 119 mg/dL — ABNORMAL HIGH (ref 70–99)
Glucose-Capillary: 161 mg/dL — ABNORMAL HIGH (ref 70–99)

## 2020-01-25 LAB — BASIC METABOLIC PANEL
Anion gap: 9 (ref 5–15)
BUN: 10 mg/dL (ref 8–23)
CO2: 31 mmol/L (ref 22–32)
Calcium: 8.1 mg/dL — ABNORMAL LOW (ref 8.9–10.3)
Chloride: 97 mmol/L — ABNORMAL LOW (ref 98–111)
Creatinine, Ser: 0.68 mg/dL (ref 0.44–1.00)
GFR, Estimated: >60 mL/min (ref >60)
Glucose, Bld: 106 mg/dL — ABNORMAL HIGH (ref 70–99)
Potassium: 3.8 mmol/L (ref 3.5–5.1)
Sodium: 137 mmol/L (ref 135–145)

## 2020-01-25 SURGERY — ESOPHAGOGASTRODUODENOSCOPY (EGD) WITH PROPOFOL
Anesthesia: General

## 2020-01-25 MED ORDER — STERILE WATER FOR IRRIGATION IR SOLN
Status: DC | PRN
  Administered 2020-01-25: 200 mL

## 2020-01-25 MED ORDER — PROPOFOL 10 MG/ML IV BOLUS
INTRAVENOUS | Status: AC
  Filled 2020-01-25: qty 20

## 2020-01-25 MED ORDER — PROPOFOL 10 MG/ML IV BOLUS
INTRAVENOUS | Status: AC
  Filled 2020-01-25: qty 40

## 2020-01-25 MED ORDER — LIDOCAINE 2% (20 MG/ML) 5 ML SYRINGE
INTRAMUSCULAR | Status: AC
  Filled 2020-01-25: qty 5

## 2020-01-25 MED ORDER — SPOT INK MARKER SYRINGE KIT
PACK | SUBMUCOSAL | Status: DC | PRN
  Administered 2020-01-25: 4 mL via SUBMUCOSAL

## 2020-01-25 MED ORDER — SPOT INK MARKER SYRINGE KIT
PACK | SUBMUCOSAL | Status: AC
  Filled 2020-01-25: qty 5

## 2020-01-25 MED ORDER — PROPOFOL 10 MG/ML IV BOLUS
INTRAVENOUS | Status: DC | PRN
  Administered 2020-01-25 (×2): 50 mg via INTRAVENOUS
  Administered 2020-01-25: 200 mg via INTRAVENOUS
  Administered 2020-01-25: 100 mg via INTRAVENOUS
  Administered 2020-01-25: 150 mg via INTRAVENOUS
  Administered 2020-01-25: 50 mg via INTRAVENOUS
  Administered 2020-01-25: 150 mg via INTRAVENOUS

## 2020-01-25 MED ORDER — SODIUM CHLORIDE 0.9 % IV SOLN: INTRAVENOUS | Status: DC

## 2020-01-25 NOTE — Plan of Care (Signed)
  Problem: Education: Goal: Knowledge of General Education information will improve Description Including pain rating scale, medication(s)/side effects and non-pharmacologic comfort measures Outcome: Progressing   Problem: Health Behavior/Discharge Planning: Goal: Ability to manage health-related needs will improve Outcome: Progressing   

## 2020-01-25 NOTE — Progress Notes (Signed)
Patient Demographics:    Savannah Burns, is a 69 y.o. female, DOB - 1950/11/13, HGD:[id number]  Admit date - 01/22/2020   Admitting Physician Albertine Patricia, MD  Outpatient Primary MD for the patient is [name], DO  LOS - 3   Chief Complaint  Patient presents with  . Abnormal Lab        Subjective:    Savannah Burns patient is tired today after procedure -She is not had any shortness of breath -No abdominal pain  Assessment  & Plan :    Principal Problem:   Symptomatic anemia Active Problems:   Diabetes mellitus, type 2 (HCC)   Hyperlipidemia associated with type 2 diabetes mellitus (HCC)   HYPERTENSION, BENIGN SYSTEMIC   COPD (chronic obstructive pulmonary disease) (HCC)   Chronic diastolic HF (heart failure) (HCC)   Chronic dyspnea   ABLA (acute blood loss anemia)   Iron deficiency anemia   Acute on chronic congestive heart failure (HCC)  Brief Summary:- 69 y.o. female with past medical history significant for hypertension, diabetes, COPD on home oxygen 5 L, chronic diastolic CHF, mild aortic stenosis with normal LVEF 70 to 75% with grade 1 diastolic dysfunction admitted on 01/22/2020 with progressive dyspnea and found to have a hemoglobin of 3.8  A/p 1) acute on chronic symptomatic anemia--admission hemoglobin down to 3.7. Patient has received a total of 5 unit prbc with improvement of Hgb to 7.8 -Continue IV Protonix, -Seen by GI and underwent EGD/colonoscopy on 10/30 -Results indicated early varices and very large polyp in colon that was resected.  Biopsies have been sent -Patient with underlying chronic iron deficiency anemia--a lot more symptomatic now with dyspnea, fatigue and hypoxia-increased oxygen requirement -Continue to follow hemoglobin  2) chronic hypoxic respiratory failure--- secondary to COPD and CHF, at baseline patient is on 5 L of oxygen,    -Continue oxygen supplementation  3)DM2-A1c 6.3 reflecting excellent diabetic control PTA -Metformin on hold Use Novolog/Humalog Sliding scale insulin with Accu-Cheks/Fingersticks as ordered   4) acute on chronic diastolic dysfunction CHF/exacerbation---unfortunately PTA patient was not compliant with diuretics, she presented with some degree of volume overload this admission -continue aggressive diuresis with IV Lasix -Fluid input and output monitoring and daily weights  -Appears to be having good urine output  5)HTN-lisinopril on hold May use IV labetalol as needed elevated BP  6) COPD--no acute exacerbation at this time continue bronchodilators  Disposition/Need for in-Hospital Stay- patient unable to be discharged at this time due to -symptomatic anemia requiring transfusion of at least 5 units of PRBCs at this time, CHF/volume overload requiring IV diuresis with IV Lasix*   Status is: Inpatient  Remains inpatient appropriate because:See above   Disposition: The patient is from: Home              Anticipated d/c is to: Home              Anticipated d/c date is: 2 days              Patient currently is not medically stable to d/c. Barriers: Not Clinically Stable- symptomatic anemia requiring transfusion of at least 5 units of PRBCs at this time, CHF/volume overload requiring IV diuresis with IV Lasix  Code Status : Full  Family Communication:    (patient is alert, awake and coherent)   Consults  :  Gi  DVT Prophylaxis  :  - SCDs   Lab Results  Component Value Date   PLT 160 01/25/2020    Inpatient Medications  Scheduled Meds: . sodium chloride   Intravenous Once  . escitalopram  20 mg Oral Daily  . furosemide  40 mg Intravenous Q8H  . influenza vaccine adjuvanted  0.5 mL Intramuscular Tomorrow-1000  . [START ON 01/26/2020] pantoprazole  40 mg Intravenous Q12H  . pneumococcal 23 valent vaccine  0.5 mL Intramuscular Tomorrow-1000  . pravastatin  10 mg Oral q1800    Continuous Infusions: . pantoprozole (PROTONIX) infusion 8 mg/hr (01/25/20 0236)   PRN Meds:.oxyCODONE-acetaminophen **AND** oxyCODONE, polyvinyl alcohol    Anti-infectives (From admission, onward)   None        Objective:   Vitals:   01/25/20 1010 01/25/20 1015 01/25/20 1030 01/25/20 1332  BP: (!) 118/59 113/63  (!) 108/40  Pulse: 92 91 84 79  Resp: 19 (!) 21  19  Temp: 98.6 F (37 C)   98.5 F (36.9 C)  TempSrc:    Oral  SpO2: 95% 94% 97%   Weight:      Height:        Wt Readings from Last 3 Encounters:  01/25/20 134.6 kg  01/22/20 (!) 140.1 kg  08/20/19 (!) 140.1 kg     Intake/Output Summary (Last 24 hours) at 01/25/2020 1708 Last data filed at 01/25/2020 1300 Gross per 24 hour  Intake 240 ml  Output 2001 ml  Net -1761 ml    Physical Exam  General exam: Alert, awake, oriented x 3 Respiratory system: Clear to auscultation. Respiratory effort normal. Cardiovascular system:RRR. No murmurs, rubs, gallops. Gastrointestinal system: Abdomen is nondistended, soft and nontender. No organomegaly or masses felt. Normal bowel sounds heard. Central nervous system: Alert and oriented. No focal neurological deficits. Extremities: +edema with thickening of skin Skin: No rashes, lesions or ulcers Psychiatry: Judgement and insight appear normal. Mood & affect appropriate.     Data Review:   Micro Results Recent Results (from the past 240 hour(s))  Respiratory Panel by RT PCR (Flu A&B, Covid) - Nasopharyngeal Swab     Status: None   Collection Time: 01/22/20  4:40 PM   Specimen: Nasopharyngeal Swab  Result Value Ref Range Status   SARS Coronavirus 2 by RT PCR NEGATIVE NEGATIVE Final    Comment: (NOTE) SARS-CoV-2 target nucleic acids are NOT DETECTED.  The SARS-CoV-2 RNA is generally detectable in upper respiratoy specimens during the acute phase of infection. The lowest concentration of SARS-CoV-2 viral copies this assay can detect is 131 copies/mL. A  negative result does not preclude SARS-Cov-2 infection and should not be used as the sole basis for treatment or other patient management decisions. A negative result may occur with  improper specimen collection/handling, submission of specimen other than nasopharyngeal swab, presence of viral mutation(s) within the areas targeted by this assay, and inadequate number of viral copies (<131 copies/mL). A negative result must be combined with clinical observations, patient history, and epidemiological information. The expected result is Negative.  Fact Sheet for Patients:  PinkCheek.be  Fact Sheet for Healthcare Providers:  GravelBags.it  This test is no t yet approved or cleared by the [place] FDA and  has been authorized for detection and/or diagnosis of SARS-CoV-2 by FDA under an Emergency Use Authorization (EUA). This EUA will remain  in effect (meaning this test can  be used) for the duration of the COVID-19 declaration under Section 564(b)(1) of the Act, 21 U.S.C. section 360bbb-3(b)(1), unless the authorization is terminated or revoked sooner.     Influenza A by PCR NEGATIVE NEGATIVE Final   Influenza B by PCR NEGATIVE NEGATIVE Final    Comment: (NOTE) The Xpert Xpress SARS-CoV-2/FLU/RSV assay is intended as an aid in  the diagnosis of influenza from Nasopharyngeal swab specimens and  should not be used as a sole basis for treatment. Nasal washings and  aspirates are unacceptable for Xpert Xpress SARS-CoV-2/FLU/RSV  testing.  Fact Sheet for Patients: PinkCheek.be  Fact Sheet for Healthcare Providers: GravelBags.it  This test is not yet approved or cleared by the [place] FDA and  has been authorized for detection and/or diagnosis of SARS-CoV-2 by  FDA under an Emergency Use Authorization (EUA). This EUA will remain  in effect (meaning this test can  be used) for the duration of the  Covid-19 declaration under Section 564(b)(1) of the Act, 21  U.S.C. section 360bbb-3(b)(1), unless the authorization is  terminated or revoked. Performed at [facility], [address], [place], [place] [address]   MRSA PCR Screening     Status: None   Collection Time: 01/23/20  1:57 PM   Specimen: Nasal Mucosa; Nasopharyngeal  Result Value Ref Range Status   MRSA by PCR NEGATIVE NEGATIVE Final    Comment:        The GeneXpert MRSA Assay (FDA approved for NASAL specimens only), is one component of a comprehensive MRSA colonization surveillance program. It is not intended to diagnose MRSA infection nor to guide or monitor treatment for MRSA infections. Performed at [facility], [address] [place], [place] [address]     Radiology Reports DG Chest Portable 1 View  Result Date: 01/22/2020 CLINICAL DATA:  Dyspnea EXAM: PORTABLE CHEST 1 VIEW COMPARISON:  02/28/2019 chest radiograph. FINDINGS: Stable cardiomediastinal silhouette with mild cardiomegaly. No pneumothorax. No pleural effusion. Mild pulmonary edema. No acute consolidative airspace disease. IMPRESSION: Mild congestive heart failure. Electronically Signed   By: Ilona Sorrel M.D.   On: 01/22/2020 17:24   US Abdomen Limited RUQ (LIVER/GB)  Result Date: 01/25/2020 CLINICAL DATA:  Elevated LFTs. EXAM: ULTRASOUND ABDOMEN LIMITED RIGHT UPPER QUADRANT COMPARISON:  CT of the chest March 2009 FINDINGS: Gallbladder: There layering gallstones, the largest measuring 1.2 cm. No gallbladder wall thickening visualized. No sonographic Murphy sign noted by sonographer. Common bile duct: Diameter: 4.8 mm Liver: No focal lesion identified. Diffusely increased parenchymal echogenicity. Portal vein is patent on color Doppler imaging with normal direction of blood flow towards the liver. Other: The study is technically limited by body habitus. IMPRESSION: 1. Cholelithiasis without evidence of acute cholecystitis.  2. Diffusely increased parenchymal echogenicity of the liver, which may be seen with fatty infiltration or other hepatocellular disease. Electronically Signed   By: Fidela Salisbury M.D.   On: 01/25/2020 12:47     CBC Recent Labs  Lab 01/22/20 1457 01/22/20 1639 01/23/20 0321 01/24/20 0820 01/25/20 0515  WBC 8.3 9.0 8.0 7.9 8.6  HGB 3.8* 3.7* 5.5* 7.0* 7.8*  HCT 15.6* 15.3* 20.1* 24.6* 27.1*  PLT 182 184 176 154 160  MCV 72.9* 72.2* 77.9* 80.4 82.6  MCH 17.8* 17.5* 21.3* 22.9* 23.8*  MCHC 24.4* 24.2* 27.4* 28.5* 28.8*  RDW 21.2* 21.5* 21.2* 21.7* 22.2*  LYMPHSABS  --  1.9  --   --   --   MONOABS  --  0.8  --   --   --  EOSABS  --  0.1  --   --   --   BASOSABS  --  0.0  --   --   --     Chemistries  Recent Labs  Lab 01/22/20 1457 01/22/20 1639 01/23/20 0321 01/24/20 0820 01/25/20 0515  NA 135 135 135 134* 137  K 3.7 3.6 4.1 3.4* 3.8  CL 97* 96* 95* 94* 97*  CO2 32 31 33* 33* 31  GLUCOSE 168* 143* 116* 114* 106*  BUN 13 14 13 12 10   CREATININE 0.61 0.69 0.73 0.66 0.68  CALCIUM 8.2* 8.2* 7.6* 8.0* 8.1*  AST 14*  --  15  --   --   ALT 8  --  8  --   --   ALKPHOS 64  --  65  --   --   BILITOT 0.5  --  0.9  --   --    ------------------------------------------------------------------------------------------------------------------ No results for input(s): CHOL, HDL, LDLCALC, TRIG, CHOLHDL, LDLDIRECT in the last 72 hours.  Lab Results  Component Value Date   HGBA1C 6.3 (H) 01/22/2020   ------------------------------------------------------------------------------------------------------------------ No results for input(s): TSH, T4TOTAL, T3FREE, THYROIDAB in the last 72 hours.  Invalid input(s): FREET3 ------------------------------------------------------------------------------------------------------------------ Recent Labs    01/22/20 1722  RETICCTPCT 3.6*    Coagulation profile Recent Labs  Lab 01/22/20 1639  INR 1.1    No results for input(s):  DDIMER in the last 72 hours.  Cardiac Enzymes No results for input(s): CKMB, TROPONINI, MYOGLOBIN in the last 168 hours.  Invalid input(s): CK ------------------------------------------------------------------------------------------------------------------    Component Value Date/Time   BNP 149.0 (H) 01/22/2020 1655    Kathie Dike M.D on 01/25/2020 at 5:08 PM  Go to [email or web address] - for contact info  [facility] - Office  [phone]

## 2020-01-25 NOTE — Anesthesia Preprocedure Evaluation (Addendum)
Anesthesia Evaluation  Patient identified by MRN, date of birth, ID band Patient awake    Reviewed: Allergy & Precautions, NPO status , Patient's Chart, lab work & pertinent test results, reviewed documented beta blocker date and time   History of Anesthesia Complications Negative for: history of anesthetic complications  Airway Mallampati: III  TM Distance: >3 FB Neck ROM: Full    Dental no notable dental hx.    Pulmonary shortness of breath, COPD,  COPD inhaler, former smoker,    Pulmonary exam normal breath sounds clear to auscultation       Cardiovascular hypertension, +CHF  Normal cardiovascular exam Rhythm:Regular Rate:Normal     Neuro/Psych    GI/Hepatic GI bleed   Endo/Other  diabetes, Poorly Controlled, Type 2  Renal/GU      Musculoskeletal  (+) Arthritis ,   Abdominal   Peds  Hematology  (+) anemia ,   Anesthesia Other Findings   Reproductive/Obstetrics                             Anesthesia Physical Anesthesia Plan  ASA: III  Anesthesia Plan: General   Post-op Pain Management:    Induction: Intravenous  PONV Risk Score and Plan:   Airway Management Planned: Nasal Cannula  Additional Equipment:   Intra-op Plan:   Post-operative Plan:   Informed Consent: I have reviewed the patients History and Physical, chart, labs and discussed the procedure including the risks, benefits and alternatives for the proposed anesthesia with the patient or authorized representative who has indicated his/her understanding and acceptance.     Dental advisory given  Plan Discussed with: CRNA  Anesthesia Plan Comments:         Anesthesia Quick Evaluation

## 2020-01-25 NOTE — Op Note (Signed)
[facility] Patient Name: [facility] Procedure Date: 01/25/2020 9:02 AM MRN: [id number] Date of Birth: 22-Mar-1951 Attending MD: Elon Alas. Abbey Chatters DO CSN: [id number] Age: 69 Admit Type: Inpatient Procedure:                Colonoscopy Indications:              Iron deficiency anemia Providers:                Elon Alas. Abbey Chatters, DO, Crystal Page, Nelma Rothman,                            Technician Referring MD:              Medicines:                See the Anesthesia note for documentation of the                            administered medications Complications:            No immediate complications. Estimated Blood Loss:     Estimated blood loss was minimal. Procedure:                Pre-Anesthesia Assessment:                           - The anesthesia plan was to use monitored                            anesthesia care (MAC).                           After obtaining informed consent, the colonoscope                            was passed under direct vision. Throughout the                            procedure, the patient's blood pressure, pulse, and                            oxygen saturations were monitored continuously. The                            PCF-H190DL ([id number]) scope was introduced through                            the anus and advanced to the the cecum, identified                            by appendiceal orifice and ileocecal valve. The                            colonoscopy was performed without difficulty. The                            patient tolerated the procedure well. The quality  of the bowel preparation was evaluated using the                            BBPS [facility] Bowel Preparation Scale) with scores                            of: Right Colon = 2 (minor amount of residual                            staining, small fragments of stool and/or opaque                            liquid, but mucosa seen well), Transverse Colon  = 3                            (entire mucosa seen well with no residual staining,                            small fragments of stool or opaque liquid) and Left                            Colon = 3 (entire mucosa seen well with no residual                            staining, small fragments of stool or opaque                            liquid). The total BBPS score equals 8. The quality                            of the bowel preparation was good. Scope In: 9:06:08 AM Scope Out: 10:02:13 AM Scope Withdrawal Time: 0 hours 51 minutes 21 seconds  Total Procedure Duration: 0 hours 56 minutes 5 seconds  Findings:      Skin tags were found on perianal exam.      Non-bleeding internal hemorrhoids were found during endoscopy.      Multiple small and large-mouthed diverticula were found in the sigmoid       colon and descending colon.      A 5 mm polyp was found in the ascending colon. The polyp was sessile.       The polyp was removed with a cold snare. Resection and retrieval were       complete.      A 10 mm polyp was found in the sigmoid colon. The polyp was       pedunculated. The polyp was removed with a hot snare. Resection and       retrieval were complete.      A 30 mm polyp was found in the transverse colon. The polyp was       multi-lobulated. The polyp was removed with a piecemeal technique using       a hot snare. Resection and retrieval were complete. Area proximal and       distal were tattooed with an injection of Spot (carbon black). Impression:               -  Perianal skin tags found on perianal exam.                           - Non-bleeding internal hemorrhoids.                           - Diverticulosis in the sigmoid colon and in the                            descending colon.                           - One 5 mm polyp in the ascending colon, removed                            with a cold snare. Resected and retrieved.                           - One 10 mm polyp in the  sigmoid colon, removed                            with a hot snare. Resected and retrieved.                           - One 30 mm polyp in the transverse colon, removed                            piecemeal using a hot snare. Resected and                            retrieved. Tattooed. Moderate Sedation:      Per Anesthesia Care Recommendation:           - Return patient to hospital ward for ongoing care.                           - Await pathology results.                           - Repeat colonoscopy in 3 months for surveillance                            based on pathology results. Procedure Code(s):        --- Professional ---                           [phone], 22, Colonoscopy, flexible; with removal of                            tumor(s), polyp(s), or other lesion(s) by snare                            technique                           45381,  Colonoscopy, flexible; with directed                            submucosal injection(s), any substance Diagnosis Code(s):        --- Professional ---                           K64.8, Other hemorrhoids                           K63.5, Polyp of colon                           K64.4, Residual hemorrhoidal skin tags                           D50.9, Iron deficiency anemia, unspecified                           K57.30, Diverticulosis of large intestine without                            perforation or abscess without bleeding CPT copyright 2019 American [employer] Association. All rights reserved. The codes documented in this report are preliminary and upon coder review may  be revised to meet current compliance requirements. Elon Alas. Abbey Chatters, DO Gonzales McCune, DO 01/25/2020 10:13:22 AM This report has been signed electronically. Number of Addenda: 0

## 2020-01-25 NOTE — Op Note (Signed)
[facility] Patient Name: [facility] Procedure Date: 01/25/2020 8:18 AM MRN: [id number] Date of Birth: 01-06-1951 Attending MD: Elon Alas. Abbey Chatters DO CSN: [id number] Age: 69 Admit Type: Inpatient Procedure:                Upper GI endoscopy Indications:              Iron deficiency anemia Providers:                Elon Alas. Abbey Chatters, DO, Crystal Page, Nelma Rothman,                            Technician Referring MD:              Medicines:                See the Anesthesia note for documentation of the                            administered medications Complications:            No immediate complications. Estimated Blood Loss:     Estimated blood loss: none. Procedure:                Pre-Anesthesia Assessment:                           - The anesthesia plan was to use monitored                            anesthesia care (MAC).                           After obtaining informed consent, the endoscope was                            passed under direct vision. Throughout the                            procedure, the patient's blood pressure, pulse, and                            oxygen saturations were monitored continuously. The                            GIF-H190 ([id number]) scope was introduced through the                            mouth, and advanced to the second part of duodenum.                            The upper GI endoscopy was accomplished without                            difficulty. The patient tolerated the procedure                            well. Scope In: 9:59:04 AM Scope Out:  9:02:11 AM Findings:      Four columns of grade I, small (< 5 mm) varices with no bleeding and no       stigmata of recent bleeding were found in the lower third of the       esophagus,. No red wale signs were present.      The entire examined stomach was normal.      The duodenal bulb and first portion of the duodenum were normal. Impression:               - Grade I and small (< 5  mm) esophageal varices                            with no bleeding and no stigmata of recent bleeding.                           - Normal stomach.                           - Normal duodenal bulb and first portion of the                            duodenum.                           - No specimens collected. Moderate Sedation:      Per Anesthesia Care Recommendation:           - Return patient to hospital ward for ongoing care.                           - Advance diet as tolerated.                           - Recommend further imaging to evaluate for                            cirrhosis. Ultrasound may be difficult given                            patient's body habitus. Consider cross sectional                            imaging.                           - Pending imaging results, we will likely start                            patient on non-selective beta blocker. Procedure Code(s):        --- Professional ---                           [phone], Esophagogastroduodenoscopy, flexible,                            transoral; diagnostic, including collection of  specimen(s) by brushing or washing, when performed                            (separate procedure) Diagnosis Code(s):        --- Professional ---                           I85.00, Esophageal varices without bleeding                           D50.9, Iron deficiency anemia, unspecified CPT copyright 2019 American Medical Association. All rights reserved. The codes documented in this report are preliminary and upon coder review may  be revised to meet current compliance requirements. Elon Alas. Abbey Chatters, DO [place] Greyson Peavy, DO 01/25/2020 10:16:17 AM This report has been signed electronically. Number of Addenda: 0

## 2020-01-25 NOTE — Interval H&P Note (Signed)
History and Physical Interval Note:  01/25/2020 8:37 AM  [name]  has presented today for surgery, with the diagnosis of symptomatic profound anemia.  The various methods of treatment have been discussed with the patient and family. After consideration of risks, benefits and other options for treatment, the patient has consented to  Procedure(s): ESOPHAGOGASTRODUODENOSCOPY (EGD) WITH PROPOFOL (N/A) COLONOSCOPY WITH PROPOFOL (N/A) as a surgical intervention.  The patient's history has been reviewed, patient examined, no change in status, stable for surgery.  I have reviewed the patient's chart and labs.  Questions were answered to the patient's satisfaction.     Savannah Burns

## 2020-01-25 NOTE — Anesthesia Postprocedure Evaluation (Signed)
Anesthesia Post Note  Patient: Savannah Burns  Procedure(s) Performed: ESOPHAGOGASTRODUODENOSCOPY (EGD) WITH PROPOFOL (N/A ) COLONOSCOPY WITH PROPOFOL (N/A ) POLYPECTOMY SUBMUCOSAL TATTOO INJECTION  Patient location during evaluation: PACU Anesthesia Type: General Level of consciousness: awake and alert Pain management: pain level controlled Vital Signs Assessment: post-procedure vital signs reviewed and stable Respiratory status: spontaneous breathing, nonlabored ventilation, respiratory function stable and patient connected to nasal cannula oxygen Cardiovascular status: blood pressure returned to baseline and stable Postop Assessment: no apparent nausea or vomiting Anesthetic complications: no   No complications documented.   Last Vitals:  Vitals:   01/25/20 1015 01/25/20 1030  BP: 113/63   Pulse: 91 84  Resp: (!) 21   Temp:    SpO2: 94% 97%    Last Pain:  Vitals:   01/25/20 1030  TempSrc:   PainSc: 0-No pain                 Raji P Pricilla Handler

## 2020-01-26 DIAGNOSIS — E785 Hyperlipidemia, unspecified: Secondary | ICD-10-CM

## 2020-01-26 LAB — CBC
HCT: 27.8 % — ABNORMAL LOW (ref 36.0–46.0)
Hemoglobin: 7.8 g/dL — ABNORMAL LOW (ref 12.0–15.0)
MCH: 23.6 pg — ABNORMAL LOW (ref 26.0–34.0)
MCHC: 28.1 g/dL — ABNORMAL LOW (ref 30.0–36.0)
MCV: 84 fL (ref 80.0–100.0)
Platelets: 158 10*3/uL (ref 150–400)
RBC: 3.31 MIL/uL — ABNORMAL LOW (ref 3.87–5.11)
RDW: 24.5 % — ABNORMAL HIGH (ref 11.5–15.5)
WBC: 9.5 10*3/uL (ref 4.0–10.5)
nRBC: 0 % (ref 0.0–0.2)

## 2020-01-26 LAB — BASIC METABOLIC PANEL
Anion gap: 9 (ref 5–15)
BUN: 11 mg/dL (ref 8–23)
CO2: 33 mmol/L — ABNORMAL HIGH (ref 22–32)
Calcium: 8.1 mg/dL — ABNORMAL LOW (ref 8.9–10.3)
Chloride: 92 mmol/L — ABNORMAL LOW (ref 98–111)
Creatinine, Ser: 0.7 mg/dL (ref 0.44–1.00)
GFR, Estimated: >60 mL/min (ref >60)
Glucose, Bld: 110 mg/dL — ABNORMAL HIGH (ref 70–99)
Potassium: 3.2 mmol/L — ABNORMAL LOW (ref 3.5–5.1)
Sodium: 134 mmol/L — ABNORMAL LOW (ref 135–145)

## 2020-01-26 LAB — GLUCOSE, CAPILLARY
Glucose-Capillary: 206 mg/dL — ABNORMAL HIGH (ref 70–99)
Glucose-Capillary: 103 mg/dL — ABNORMAL HIGH (ref 70–99)

## 2020-01-26 MED ORDER — PANTOPRAZOLE SODIUM 40 MG PO TBEC: 40.0000 mg | DELAYED_RELEASE_TABLET | Freq: Every day | ORAL | 1 refills | Status: DC

## 2020-01-26 MED ORDER — SODIUM CHLORIDE 0.9 % IV SOLN
125.0000 mg | Freq: Once | INTRAVENOUS | Status: AC
  Administered 2020-01-26: 125 mg via INTRAVENOUS
  Filled 2020-01-26: qty 10

## 2020-01-26 MED ORDER — LEVOFLOXACIN 0.5 % OP SOLN: 1.0000 [drp] | Freq: Four times a day (QID) | OPHTHALMIC | 0 refills | Status: DC

## 2020-01-26 MED ORDER — TORSEMIDE 20 MG PO TABS
40.0000 mg | ORAL_TABLET | Freq: Two times a day (BID) | ORAL | 3 refills | Status: DC
Start: 2020-01-26 — End: 2020-02-18

## 2020-01-26 MED ORDER — FERROUS SULFATE 325 (65 FE) MG PO TABS: 325.0000 mg | ORAL_TABLET | Freq: Two times a day (BID) | ORAL | 1 refills | Status: DC

## 2020-01-26 MED ORDER — POTASSIUM CHLORIDE CRYS ER 20 MEQ PO TBCR
40.0000 meq | EXTENDED_RELEASE_TABLET | Freq: Once | ORAL | Status: AC
  Administered 2020-01-26: 40 meq via ORAL
  Filled 2020-01-26: qty 2

## 2020-01-26 NOTE — Discharge Summary (Signed)
Physician Discharge Summary  Savannah Burns PTW:[id number] DOB: 1950-06-07 DOA: 01/22/2020  PCP: [name], DO  Admit date: 01/22/2020 Discharge date: 01/26/2020  Admitted From: home Disposition:  home  Recommendations for Outpatient Follow-up:  1. Follow up with PCP in 1-2 weeks 2. Please obtain BMP/CBC in one week 3. Patient will follow up with GI as an outpatient for biopsy results and further work up of liver disease  Home Health:home health PT/RN Equipment/Devices:  Discharge Condition:stable CODE STATUS:full code Diet recommendation: heart healthy, carb modified  Brief/Interim Summary: 69 y.o.femalewith past medical history significant for hypertension, diabetes, COPD on home [id number], chronic diastolic CHF, mild aortic stenosis with normal LVEF 70 to 75% with grade 1 diastolic dysfunction admitted on 01/22/2020 with progressive dyspnea and found to have a hemoglobin of 3.8  Discharge Diagnoses:  Principal Problem:   Symptomatic anemia Active Problems:   Diabetes mellitus, type 2 ([place])   Hyperlipidemia associated with type 2 diabetes mellitus (HCC)   HYPERTENSION, BENIGN SYSTEMIC   COPD (chronic obstructive pulmonary disease) (HCC)   Chronic diastolic HF (heart failure) (HCC)   Chronic dyspnea   ABLA (acute blood loss anemia)   Iron deficiency anemia   Acute on chronic congestive heart failure (HCC)  1) acute on chronic symptomatic anemia--admission hemoglobin down to 3.7 on admission. Patient has received a total of 5 unit prbc with improvement of Hgb to 7.8 -follow up hemoglobin remained stable -she also received a dose of IV iron -Continue IV Protonix, -Seen by GI and underwent EGD/colonoscopy on 10/30 -Results indicated early varices and very large polyp in colon that was resected.  Biopsies have been sent with results pending at the time of discharge -anemia likely related to chronic blood loss from polyp  2) chronic hypoxic respiratory  failure--- secondary to COPD and CHF, at baseline patient is on 5 L of oxygen,  -Continue oxygen supplementation  3)DM2-A1c 6.3 reflecting excellent diabetic control PTA -Metformin on hold Use Novolog/Humalog Sliding scale insulin with Accu-Cheks/Fingersticks as ordered   4) acute on chronic diastolic dysfunction CHF/exacerbation---unfortunately PTA patient was not compliant with diuretics, she presented with some degree of volume overload this admission -she was treated with IV Lasix and had good urine output -she is no longer having shortness of breath and can ambulate without dyspnea -she will be resumed on home dose of torsemide  5)HTN-resume home meds on discharge  6) COPD--no acute exacerbation at this time continue bronchodilators  7) morbid obesity. BMI 54. Counseled on importance of diet and exercise  Discharge Instructions  Discharge Instructions    Diet - low sodium heart healthy   Complete by: As directed    Increase activity slowly   Complete by: As directed      Allergies as of 01/26/2020      Reactions   Keflex [cephalexin] Nausea And Vomiting      Medication List    TAKE these medications   aspirin 81 MG tablet Take 81 mg by mouth daily. Reported on 06/01/2015   escitalopram 20 MG tablet Commonly known as: LEXAPRO Take 1 tablet (20 mg total) by mouth daily.   ferrous sulfate 325 (65 FE) MG tablet Take 1 tablet (325 mg total) by mouth 2 (two) times daily with a meal.   Glucometer Dex High Control Liqd Test CBG's once daily.   hydrOXYzine 10 MG tablet Commonly known as: ATARAX/VISTARIL Take 1 tablet (10 mg total) by mouth 3 (three) times daily as needed for anxiety.   levofloxacin 0.5 %  ophthalmic solution Commonly known as: QUIXIN Place 1 drop into the right eye every 6 (six) hours. For 7 days   lisinopril 10 MG tablet Commonly known as: ZESTRIL Take 1 tablet (10 mg total) by mouth daily.   lovastatin 20 MG tablet Commonly known as:  MEVACOR Take 1 tablet (20 mg total) by mouth at bedtime.   metFORMIN 1000 MG tablet Commonly known as: GLUCOPHAGE Take 1 tablet (1,000 mg total) by mouth 2 (two) times daily with a meal.   oxyCODONE-acetaminophen 10-325 MG tablet Commonly known as: PERCOCET Take 1 tablet by mouth every 6 (six) hours as needed for pain.   pantoprazole 40 MG tablet Commonly known as: Protonix Take 1 tablet (40 mg total) by mouth daily.   potassium chloride SA 20 MEQ tablet Commonly known as: KLOR-CON Take 1 tablet (20 mEq total) by mouth 2 (two) times daily.   torsemide 20 MG tablet Commonly known as: DEMADEX Take 2 tablets (40 mg total) by mouth 2 (two) times daily.   True Metrix Air Glucose Meter w/Device Kit Check BS daily Dx E11.9   True Metrix Blood Glucose Test test strip Generic drug: glucose blood Check BS daily Dx E11.9   TRUEplus Lancets 30G Misc Check BS daily Dx E11.9       Follow-up Information    [facility], [facility] Follow up.   Specialty: Home Health Services Why: HHPT and RN Contact information: [address] [place] [address] [phone]              Allergies  Allergen Reactions  . Keflex [Cephalexin] Nausea And Vomiting    Consultations:  gastroenterology   Procedures/Studies: DG Chest Portable 1 View  Result Date: 01/22/2020 CLINICAL DATA:  Dyspnea EXAM: PORTABLE CHEST 1 VIEW COMPARISON:  02/28/2019 chest radiograph. FINDINGS: Stable cardiomediastinal silhouette with mild cardiomegaly. No pneumothorax. No pleural effusion. Mild pulmonary edema. No acute consolidative airspace disease. IMPRESSION: Mild congestive heart failure. Electronically Signed   By: Ilona Sorrel M.D.   On: 01/22/2020 17:24   US Abdomen Limited RUQ (LIVER/GB)  Result Date: 01/25/2020 CLINICAL DATA:  Elevated LFTs. EXAM: ULTRASOUND ABDOMEN LIMITED RIGHT UPPER QUADRANT COMPARISON:  CT of the chest March 2009 FINDINGS: Gallbladder: There layering gallstones,  the largest measuring 1.2 cm. No gallbladder wall thickening visualized. No sonographic Murphy sign noted by sonographer. Common bile duct: Diameter: 4.8 mm Liver: No focal lesion identified. Diffusely increased parenchymal echogenicity. Portal vein is patent on color Doppler imaging with normal direction of blood flow towards the liver. Other: The study is technically limited by body habitus. IMPRESSION: 1. Cholelithiasis without evidence of acute cholecystitis. 2. Diffusely increased parenchymal echogenicity of the liver, which may be seen with fatty infiltration or other hepatocellular disease. Electronically Signed   By: Fidela Salisbury M.D.   On: 01/25/2020 12:47       Subjective: Feeling better today. No shortness of breath. Weakness improving  Discharge Exam: Vitals:   01/25/20 1332 01/25/20 2206 01/26/20 0640 01/26/20 1439  BP: (!) 108/40 (!) 134/56 136/66 120/61  Pulse: 79 87 88 83  Resp: 19  20 18   Temp: 98.5 F (36.9 C) 99.5 F (37.5 C) 98.4 F (36.9 C) 98.8 F (37.1 C)  TempSrc: Oral Oral Oral Oral  SpO2:  100% 97% 100%  Weight:      Height:        General: Pt is alert, awake, not in acute distress Cardiovascular: RRR, S1/S2 +, no rubs, no gallops Respiratory: CTA bilaterally,  no wheezing, no rhonchi Abdominal: Soft, NT, ND, bowel sounds + Extremities: 1+ edema, no cyanosis    The results of significant diagnostics from this hospitalization (including imaging, microbiology, ancillary and laboratory) are listed below for reference.     Microbiology: Recent Results (from the past 240 hour(s))  Respiratory Panel by RT PCR (Flu A&B, Covid) - Nasopharyngeal Swab     Status: None   Collection Time: 01/22/20  4:40 PM   Specimen: Nasopharyngeal Swab  Result Value Ref Range Status   SARS Coronavirus 2 by RT PCR NEGATIVE NEGATIVE Final    Comment: (NOTE) SARS-CoV-2 target nucleic acids are NOT DETECTED.  The SARS-CoV-2 RNA is generally detectable in upper  respiratoy specimens during the acute phase of infection. The lowest concentration of SARS-CoV-2 viral copies this assay can detect is 131 copies/mL. A negative result does not preclude SARS-Cov-2 infection and should not be used as the sole basis for treatment or other patient management decisions. A negative result may occur with  improper specimen collection/handling, submission of specimen other than nasopharyngeal swab, presence of viral mutation(s) within the areas targeted by this assay, and inadequate number of viral copies (<131 copies/mL). A negative result must be combined with clinical observations, patient history, and epidemiological information. The expected result is Negative.  Fact Sheet for Patients:  PinkCheek.be  Fact Sheet for Healthcare Providers:  GravelBags.it  This test is no t yet approved or cleared by the [place] FDA and  has been authorized for detection and/or diagnosis of SARS-CoV-2 by FDA under an Emergency Use Authorization (EUA). This EUA will remain  in effect (meaning this test can be used) for the duration of the COVID-19 declaration under Section 564(b)(1) of the Act, 21 U.S.C. section 360bbb-3(b)(1), unless the authorization is terminated or revoked sooner.     Influenza A by PCR NEGATIVE NEGATIVE Final   Influenza B by PCR NEGATIVE NEGATIVE Final    Comment: (NOTE) The Xpert Xpress SARS-CoV-2/FLU/RSV assay is intended as an aid in  the diagnosis of influenza from Nasopharyngeal swab specimens and  should not be used as a sole basis for treatment. Nasal washings and  aspirates are unacceptable for Xpert Xpress SARS-CoV-2/FLU/RSV  testing.  Fact Sheet for Patients: PinkCheek.be  Fact Sheet for Healthcare Providers: GravelBags.it  This test is not yet approved or cleared by the [place] FDA and  has been  authorized for detection and/or diagnosis of SARS-CoV-2 by  FDA under an Emergency Use Authorization (EUA). This EUA will remain  in effect (meaning this test can be used) for the duration of the  Covid-19 declaration under Section 564(b)(1) of the Act, 21  U.S.C. section 360bbb-3(b)(1), unless the authorization is  terminated or revoked. Performed at [facility], [address], [place], [place] [address]   MRSA PCR Screening     Status: None   Collection Time: 01/23/20  1:57 PM   Specimen: Nasal Mucosa; Nasopharyngeal  Result Value Ref Range Status   MRSA by PCR NEGATIVE NEGATIVE Final    Comment:        The GeneXpert MRSA Assay (FDA approved for NASAL specimens only), is one component of a comprehensive MRSA colonization surveillance program. It is not intended to diagnose MRSA infection nor to guide or monitor treatment for MRSA infections. Performed at [facility], [address] [place], [place] [address]      Labs: BNP (last 3 results) Recent Labs    01/22/20 1457 01/22/20 1655  BNP 118.0* 149.0*  Basic Metabolic Panel: Recent Labs  Lab 01/22/20 1639 01/23/20 0321 01/24/20 0820 01/25/20 0515 01/26/20 0904  NA 135 135 134* 137 134*  K 3.6 4.1 3.4* 3.8 3.2*  CL 96* 95* 94* 97* 92*  CO2 31 33* 33* 31 33*  GLUCOSE 143* 116* 114* 106* 110*  BUN 14 13 12 10 11   CREATININE 0.69 0.73 0.66 0.68 0.70  CALCIUM 8.2* 7.6* 8.0* 8.1* 8.1*   Liver Function Tests: Recent Labs  Lab 01/22/20 1457 01/23/20 0321  AST 14* 15  ALT 8 8  ALKPHOS 64 65  BILITOT 0.5 0.9  PROT 7.6 7.5  ALBUMIN 2.5* 2.5*   No results for input(s): LIPASE, AMYLASE in the last 168 hours. No results for input(s): AMMONIA in the last 168 hours. CBC: Recent Labs  Lab 01/22/20 1639 01/23/20 0321 01/24/20 0820 01/25/20 0515 01/26/20 0904  WBC 9.0 8.0 7.9 8.6 9.5  NEUTROABS 6.1  --   --   --   --   HGB 3.7* 5.5* 7.0* 7.8* 7.8*  HCT 15.3* 20.1* 24.6* 27.1* 27.8*  MCV 72.2* 77.9* 80.4  82.6 84.0  PLT 184 176 154 160 158   Cardiac Enzymes: No results for input(s): CKTOTAL, CKMB, CKMBINDEX, TROPONINI in the last 168 hours. BNP: Invalid input(s): POCBNP CBG: Recent Labs  Lab 01/25/20 1012 01/25/20 1131 01/25/20 1611 01/26/20 1105 01/26/20 1621  GLUCAP 119* 105* 161* 206* 103*   D-Dimer No results for input(s): DDIMER in the last 72 hours. Hgb A1c No results for input(s): HGBA1C in the last 72 hours. Lipid Profile No results for input(s): CHOL, HDL, LDLCALC, TRIG, CHOLHDL, LDLDIRECT in the last 72 hours. Thyroid function studies No results for input(s): TSH, T4TOTAL, T3FREE, THYROIDAB in the last 72 hours.  Invalid input(s): FREET3 Anemia work up No results for input(s): VITAMINB12, FOLATE, FERRITIN, TIBC, IRON, RETICCTPCT in the last 72 hours. Urinalysis    Component Value Date/Time   COLORURINE STRAW (A) 01/22/2020 1725   APPEARANCEUR CLEAR 01/22/2020 1725   LABSPEC 1.005 01/22/2020 1725   PHURINE 8.0 01/22/2020 1725   GLUCOSEU NEGATIVE 01/22/2020 1725   HGBUR LARGE (A) 01/22/2020 1725   HGBUR negative 07/23/2007 1049   BILIRUBINUR NEGATIVE 01/22/2020 1725   BILIRUBINUR NEG 04/30/2015 1205   KETONESUR NEGATIVE 01/22/2020 1725   PROTEINUR NEGATIVE 01/22/2020 1725   UROBILINOGEN 0.2 04/30/2015 1205   UROBILINOGEN 0.2 07/23/2007 1049   NITRITE NEGATIVE 01/22/2020 1725   LEUKOCYTESUR NEGATIVE 01/22/2020 1725   Sepsis Labs Invalid input(s): PROCALCITONIN,  WBC,  LACTICIDVEN Microbiology Recent Results (from the past 240 hour(s))  Respiratory Panel by RT PCR (Flu A&B, Covid) - Nasopharyngeal Swab     Status: None   Collection Time: 01/22/20  4:40 PM   Specimen: Nasopharyngeal Swab  Result Value Ref Range Status   SARS Coronavirus 2 by RT PCR NEGATIVE NEGATIVE Final    Comment: (NOTE) SARS-CoV-2 target nucleic acids are NOT DETECTED.  The SARS-CoV-2 RNA is generally detectable in upper respiratoy specimens during the acute phase of infection. The  lowest concentration of SARS-CoV-2 viral copies this assay can detect is 131 copies/mL. A negative result does not preclude SARS-Cov-2 infection and should not be used as the sole basis for treatment or other patient management decisions. A negative result may occur with  improper specimen collection/handling, submission of specimen other than nasopharyngeal swab, presence of viral mutation(s) within the areas targeted by this assay, and inadequate number of viral copies (<131 copies/mL). A negative result must be combined with clinical  observations, patient history, and epidemiological information. The expected result is Negative.  Fact Sheet for Patients:  PinkCheek.be  Fact Sheet for Healthcare Providers:  GravelBags.it  This test is no t yet approved or cleared by the [place] FDA and  has been authorized for detection and/or diagnosis of SARS-CoV-2 by FDA under an Emergency Use Authorization (EUA). This EUA will remain  in effect (meaning this test can be used) for the duration of the COVID-19 declaration under Section 564(b)(1) of the Act, 21 U.S.C. section 360bbb-3(b)(1), unless the authorization is terminated or revoked sooner.     Influenza A by PCR NEGATIVE NEGATIVE Final   Influenza B by PCR NEGATIVE NEGATIVE Final    Comment: (NOTE) The Xpert Xpress SARS-CoV-2/FLU/RSV assay is intended as an aid in  the diagnosis of influenza from Nasopharyngeal swab specimens and  should not be used as a sole basis for treatment. Nasal washings and  aspirates are unacceptable for Xpert Xpress SARS-CoV-2/FLU/RSV  testing.  Fact Sheet for Patients: PinkCheek.be  Fact Sheet for Healthcare Providers: GravelBags.it  This test is not yet approved or cleared by the [place] FDA and  has been authorized for detection and/or diagnosis of SARS-CoV-2 by  FDA under  an Emergency Use Authorization (EUA). This EUA will remain  in effect (meaning this test can be used) for the duration of the  Covid-19 declaration under Section 564(b)(1) of the Act, 21  U.S.C. section 360bbb-3(b)(1), unless the authorization is  terminated or revoked. Performed at [facility], [address], [place], [place] [address]   MRSA PCR Screening     Status: None   Collection Time: 01/23/20  1:57 PM   Specimen: Nasal Mucosa; Nasopharyngeal  Result Value Ref Range Status   MRSA by PCR NEGATIVE NEGATIVE Final    Comment:        The GeneXpert MRSA Assay (FDA approved for NASAL specimens only), is one component of a comprehensive MRSA colonization surveillance program. It is not intended to diagnose MRSA infection nor to guide or monitor treatment for MRSA infections. Performed at [facility], [address] [place], [place] [address]      Time coordinating discharge: 81mns  SIGNED:   JKathie Dike MD  [facility] 01/26/2020, 10:44 PM   If 7PM-7AM, please contact night-coverage [email or web address]

## 2020-01-26 NOTE — Progress Notes (Signed)
Subjective: Patient seen and examined this morning.  Has no complaints for me today.  Hemoglobin is stable.  She has had no evidence of GI bleeding since her EGD colonoscopy yesterday.  Tolerating diet without any issues.  Objective: Vital signs in last 24 hours: Temp:  [98.4 F (36.9 C)-99.5 F (37.5 C)] 98.4 F (36.9 C) (10/31 0640) Pulse Rate:  [79-88] 88 (10/31 0640) Resp:  [19-20] 20 (10/31 0640) BP: (108-136)/(40-66) 136/66 (10/31 0640) SpO2:  [97 %-100 %] 97 % (10/31 0640) Last BM Date: 01/25/20 General:   Alert and oriented, pleasant OBESE Abdomen:  Bowel sounds present, soft, non-tender, non-distended. No HSM or hernias noted. No rebound or guarding. No masses appreciated  Pulses:  Normal pulses noted. Extremities:  Without clubbing or edema. Neurologic:  Alert and  oriented x4;  grossly normal neurologically. Skin:  Warm and dry, intact without significant lesions.  Cervical Nodes:  No significant cervical adenopathy. Psych:  Alert and cooperative. Normal mood and affect.  Intake/Output from previous day: 10/30 0701 - 10/31 0700 In: 240 [P.O.:240] Out: -  Intake/Output this shift: Total I/O In: 240 [P.O.:240] Out: -   Lab Results: Recent Labs    01/24/20 0820 01/25/20 0515 01/26/20 0904  WBC 7.9 8.6 9.5  HGB 7.0* 7.8* 7.8*  HCT 24.6* 27.1* 27.8*  PLT 154 160 158   BMET Recent Labs    01/24/20 0820 01/25/20 0515 01/26/20 0904  NA 134* 137 134*  K 3.4* 3.8 3.2*  CL 94* 97* 92*  CO2 33* 31 33*  GLUCOSE 114* 106* 110*  BUN 12 10 11   CREATININE 0.66 0.68 0.70  CALCIUM 8.0* 8.1* 8.1*   LFT No results for input(s): PROT, ALBUMIN, AST, ALT, ALKPHOS, BILITOT, BILIDIR, IBILI in the last 72 hours. PT/INR No results for input(s): LABPROT, INR in the last 72 hours. Hepatitis Panel No results for input(s): HEPBSAG, HCVAB, HEPAIGM, HEPBIGM in the last 72 hours.   Studies/Results: US Abdomen Limited RUQ (LIVER/GB)  Result Date: 01/25/2020 CLINICAL DATA:   Elevated LFTs. EXAM: ULTRASOUND ABDOMEN LIMITED RIGHT UPPER QUADRANT COMPARISON:  CT of the chest March 2009 FINDINGS: Gallbladder: There layering gallstones, the largest measuring 1.2 cm. No gallbladder wall thickening visualized. No sonographic Murphy sign noted by sonographer. Common bile duct: Diameter: 4.8 mm Liver: No focal lesion identified. Diffusely increased parenchymal echogenicity. Portal vein is patent on color Doppler imaging with normal direction of blood flow towards the liver. Other: The study is technically limited by body habitus. IMPRESSION: 1. Cholelithiasis without evidence of acute cholecystitis. 2. Diffusely increased parenchymal echogenicity of the liver, which may be seen with fatty infiltration or other hepatocellular disease. Electronically Signed   By: Fidela Salisbury M.D.   On: 01/25/2020 12:47    Assessment: *Iron deficiency anemia *Large transverse colon polyp s/p endoscopic resection *Fatty liver disease - query underlying cirrhosis *Small esophageal varices  Plan: Patient's iron deficiency anemia likely due to slow ooze from her large transverse colon polyp which was resected endoscopically 01/25/2020.  Hemoglobin stable without evidence of bleeding.  We will need repeat colonoscopy in 3 to 6 months to evaluate polypectomy site.  Given the large size of polyps she is at higher risk for post polypectomy bleed.  I did discuss this with patient and she will call office with any signs of bleeding.  Small esophageal varices noted on upper endoscopy which flattened with insufflation. No evidence of bleeding or stigmata of bleeding. Fatty liver noted on [place] though I suspect she may have  underlying cirrhosis. Will need further workup in the outpatient setting. Will consider starting NSBB for primary ppx.   Patient to f/u in GI clinic in 2-3 weeks which I will arrange.  Okay to DC patient from a GI standpoint.   GI to sign off, please call with any questions/concerns.    Elon Alas. Abbey Chatters, D.O. Gastroenterology and Hepatology [facility]   LOS: 4 days    01/26/2020, 11:16 AM

## 2020-01-26 NOTE — Progress Notes (Signed)
Patient's belongings returned.  Patient discharged via wheelchair to home with daughter.

## 2020-01-26 NOTE — TOC Transition Note (Signed)
Transition of Care [facility]) - CM/SW Discharge Note   Patient Details  Name: REMIE MATHISON MRN: [id number] Date of Birth: [birth year]  Transition of Care [facility]) CM/SW Contact:  Natasha Bence, LCSW Phone Number: 01/26/2020, 3:31 PM   Clinical Narrative:    CSW received [facility] referral for patient. CSW referred patient to [place] with Alvis Lemmings. Georgina Snell agreeable to provide services to patient. TOC signing off.    Final next level of care: [place] Barriers to Discharge: Barriers Resolved   Patient Goals and CMS Choice Patient states their goals for this hospitalization and ongoing recovery are:: Return home with [facility]   Choice offered to / list presented to : Patient  Discharge Placement                    Patient and family notified of of transfer: 01/26/20  Discharge Plan and Services     Post Acute Care Choice: [place]: RN, PT [facility] Agency: [place] Date [place]: 01/26/20 Time [place]: 1528 Representative spoke with at [place]: [place] Determinants of Health ([place]) Interventions     Readmission Risk Interventions No flowsheet data found.

## 2020-01-26 NOTE — Plan of Care (Signed)
  Problem: Education: Goal: Knowledge of General Education information will improve Description: Including pain rating scale, medication(s)/side effects and non-pharmacologic comfort measures Outcome: Progressing   Problem: Clinical Measurements: Goal: Diagnostic test results will improve Outcome: Progressing

## 2020-01-27 ENCOUNTER — Encounter (HOSPITAL_COMMUNITY): Payer: Self-pay | Admitting: Internal Medicine

## 2020-01-27 ENCOUNTER — Telehealth: Payer: Self-pay

## 2020-01-27 LAB — GLUCOSE, CAPILLARY: Glucose-Capillary: 108 mg/dL — ABNORMAL HIGH (ref 70–99)

## 2020-01-27 NOTE — Telephone Encounter (Signed)
Transition Care Management Unsuccessful Follow-up Telephone Call  Date of discharge and from where:  01/26/20- [facility]  Attempts:  1st Attempt  Reason for unsuccessful TCM follow-up call:  No answer/busy.

## 2020-01-28 ENCOUNTER — Telehealth: Payer: Self-pay

## 2020-01-28 LAB — SURGICAL PATHOLOGY

## 2020-01-28 NOTE — Telephone Encounter (Signed)
Transition Care Management Unsuccessful Follow-up Telephone Call  Date of discharge and from where:  01/26/20/[place]  Attempts:  2nd Attempt  Reason for unsuccessful TCM follow-up call:  Unable to leave message

## 2020-01-29 ENCOUNTER — Telehealth: Payer: Self-pay | Admitting: *Deleted

## 2020-01-29 NOTE — Telephone Encounter (Signed)
01/29/2020  Please reach out to patient to schedule TOC visit. Patient was discharge from [facility] on 01/26/20. Three unsuccessful TOC attempts have been documented within the required timeframe. Patient received an automated EMMI call yesterday and indicated that a follow-up appt has not been scheduled. See report below. Forwarding to [facility] Clinical Staff for outreach.     [name], BSN, RN-BC Embedded Chronic Care Manager [facility] [place] Management Direct Dial: [phone]

## 2020-01-29 NOTE — Telephone Encounter (Signed)
Transition Care Management Unsuccessful Follow-up Telephone Call  Date of discharge and from where:  01/26/20- [facility]  Attempts:  3rd Attempt  Reason for unsuccessful TCM follow-up call:  Left voice message

## 2020-01-29 NOTE — Telephone Encounter (Signed)
Alphonzo [place], [place]     99/7/74 1:42 PM Note Transition Care Management Unsuccessful Follow-up Telephone Call  Date of discharge and from where:  01/26/20/[place]  Attempts:  2nd Attempt  Reason for unsuccessful TCM follow-up call:  Unable to leave message

## 2020-01-29 NOTE — Telephone Encounter (Signed)
A previous attempt has been made to patient 3 times.  One attempt was this morning and LMTCB

## 2020-01-31 ENCOUNTER — Telehealth: Payer: Self-pay | Admitting: *Deleted

## 2020-01-31 DIAGNOSIS — J9611 Chronic respiratory failure with hypoxia: Secondary | ICD-10-CM | POA: Diagnosis not present

## 2020-01-31 DIAGNOSIS — D500 Iron deficiency anemia secondary to blood loss (chronic): Secondary | ICD-10-CM | POA: Diagnosis not present

## 2020-01-31 DIAGNOSIS — J441 Chronic obstructive pulmonary disease with (acute) exacerbation: Secondary | ICD-10-CM | POA: Diagnosis not present

## 2020-01-31 DIAGNOSIS — F419 Anxiety disorder, unspecified: Secondary | ICD-10-CM | POA: Diagnosis not present

## 2020-01-31 DIAGNOSIS — E785 Hyperlipidemia, unspecified: Secondary | ICD-10-CM | POA: Diagnosis not present

## 2020-01-31 DIAGNOSIS — I5033 Acute on chronic diastolic (congestive) heart failure: Secondary | ICD-10-CM | POA: Diagnosis not present

## 2020-01-31 DIAGNOSIS — E119 Type 2 diabetes mellitus without complications: Secondary | ICD-10-CM | POA: Diagnosis not present

## 2020-01-31 DIAGNOSIS — I110 Hypertensive heart disease with heart failure: Secondary | ICD-10-CM | POA: Diagnosis not present

## 2020-01-31 DIAGNOSIS — I8500 Esophageal varices without bleeding: Secondary | ICD-10-CM | POA: Diagnosis not present

## 2020-01-31 NOTE — Telephone Encounter (Signed)
Pt with Savannah Burns would like to have verbal order to eval and treat pt for PT Plans on 1x1 2x3 1x1 2x1 1x2 pt plan  Will fax order for [place] to sign.  Verbal given

## 2020-02-03 ENCOUNTER — Telehealth: Payer: Self-pay | Admitting: *Deleted

## 2020-02-03 NOTE — Telephone Encounter (Signed)
02/03/2020  Please reach out to patient to schedule TOC visit. Patient was discharge from [facility] on 01/26/20. Multiple unsuccessful TOC attempts have been documented within the required timeframe. Patient received a 2nd automated EMMI call on Friday, 01/31/20 and she indicated that a follow-up appt has not been scheduled. See report below. Forwarding to [facility] Clinical Staff for outreach to schedule appointment.     [name], BSN, RN-BC Embedded Chronic Care Manager [facility] [facility] Direct Dial: [phone]

## 2020-02-04 DIAGNOSIS — J441 Chronic obstructive pulmonary disease with (acute) exacerbation: Secondary | ICD-10-CM | POA: Diagnosis not present

## 2020-02-04 DIAGNOSIS — J9611 Chronic respiratory failure with hypoxia: Secondary | ICD-10-CM | POA: Diagnosis not present

## 2020-02-04 DIAGNOSIS — E119 Type 2 diabetes mellitus without complications: Secondary | ICD-10-CM | POA: Diagnosis not present

## 2020-02-04 DIAGNOSIS — D500 Iron deficiency anemia secondary to blood loss (chronic): Secondary | ICD-10-CM | POA: Diagnosis not present

## 2020-02-04 DIAGNOSIS — I110 Hypertensive heart disease with heart failure: Secondary | ICD-10-CM | POA: Diagnosis not present

## 2020-02-04 DIAGNOSIS — E785 Hyperlipidemia, unspecified: Secondary | ICD-10-CM | POA: Diagnosis not present

## 2020-02-04 DIAGNOSIS — I5033 Acute on chronic diastolic (congestive) heart failure: Secondary | ICD-10-CM | POA: Diagnosis not present

## 2020-02-04 DIAGNOSIS — F419 Anxiety disorder, unspecified: Secondary | ICD-10-CM | POA: Diagnosis not present

## 2020-02-04 DIAGNOSIS — I8500 Esophageal varices without bleeding: Secondary | ICD-10-CM | POA: Diagnosis not present

## 2020-02-04 NOTE — Telephone Encounter (Signed)
Several attempts have been made to patient with no call backs- this encounter will be closed.

## 2020-02-04 NOTE — Progress Notes (Signed)
 Cardiology Office Note    Date:  02/18/2020   ID:  Savannah Burns, DOB 01/10/1951, MRN [id number]  PCP:  Gottschalk, Ashly M, DO  Cardiologist: Branch, Jonathan, MD EPS: None  No chief complaint on file.   History of Present Illness:  Savannah Burns is a 69 y.o. female with  history of chronic diastolic CHF, COPD on home O2, HTN, HLD, and Type 2 DM, mild AS with normal LVEF and G1DD on echo 05/07/19.   Patient saw PCP 01/08/2020 with lower extremity edema. She was told to wear compression stockings but she never got them. She had stopped taking all of her medications for 6 months but resumed furosemide 1month ago but no other meds. She increased it to 60 mg twice daily for 1 week but did not help. Not urinating much and doesn't think it's working. Up 15 lbs. Not taking lisinopril or lovastatin. Watches her salt.   I checked labs and Hbg was 3.7 and sent to ED. endoscopy 01/25/2020 showed small esophageal varices with no bleeding, normal stomach.  Colonoscopy had 3 polyps 5 mm 10mm and 30 mm that were taken out and awaiting path.  She was transfused 5 units of PRBCs.  She did receive IV Lasix in the hospital.  Patient comes in for f/u with her daughter. Is all swollen again. Has cut back on salt.  Was 280 lbs when she left the hospital, now she is up to 287.  Overall she is down from 308 pounds when she went into the hospital.  Says she is taking care of herself and is actually trying to walk and not use a wheelchair as much.  She is off oxygen.     Past Medical History:  Diagnosis Date  . Adrenal adenoma, left    Stable  . Anxiety   . Arthritis    bilateral hands  . CHF (congestive heart failure) (HCC)   . COPD (chronic obstructive pulmonary disease) (HCC)   . Depression   . Diabetes mellitus without complication (HCC)   . Dyspnea   . Grade II diastolic dysfunction   . History of kidney stones   . Hyperlipidemia   . Hypertension   . Lower back pain   . Panic attacks    . Pneumonia    currently taking antibiotic and prednisone for early stages of pneumonia  . Pulmonary nodules    bilateral  . Skin cancer    face    Past Surgical History:  Procedure Laterality Date  . Breast Cystectomy  Right   . CESAREAN SECTION    . COLONOSCOPY WITH PROPOFOL N/A 01/25/2020   Procedure: COLONOSCOPY WITH PROPOFOL;  Surgeon: Carver, Charles K, DO;  Location: [facility];  Service: Endoscopy;  Laterality: N/A;  . CYSTOSCOPY/URETEROSCOPY/HOLMIUM LASER/STENT PLACEMENT Bilateral 03/01/2019   Procedure: CYSTOSCOPY/RETROGRADEURETEROSCOPY/HOLMIUM LASER/STENT PLACEMENT;  Surgeon: Winter, Christopher Aaron, MD;  Location: [facility];  Service: Urology;  Laterality: Bilateral;  ONLY NEEDS 60 MIN  . ESOPHAGOGASTRODUODENOSCOPY (EGD) WITH PROPOFOL N/A 01/25/2020   Procedure: ESOPHAGOGASTRODUODENOSCOPY (EGD) WITH PROPOFOL;  Surgeon: Carver, Charles K, DO;  Location: [facility];  Service: Endoscopy;  Laterality: N/A;  . POLYPECTOMY  01/25/2020   Procedure: POLYPECTOMY;  Surgeon: Carver, Charles K, DO;  Location: [facility];  Service: Endoscopy;;  . SKIN CANCER EXCISION     Face  . SPINE SURGERY    . SUBMUCOSAL TATTOO INJECTION  01/25/2020   Procedure: SUBMUCOSAL TATTOO INJECTION;  Surgeon: Carver, Charles K, DO;  Location:   [facility];  Service: Endoscopy;;    Current Medications: Current Meds  Medication Sig  . aspirin 81 MG tablet Take 81 mg by mouth daily. Reported on 06/01/2015  . Blood Glucose Calibration (GLUCOMETER DEX HIGH CONTROL) LIQD Test CBG's once daily.   . Blood Glucose Monitoring Suppl (TRUE METRIX AIR GLUCOSE METER) w/Device KIT Check BS daily Dx E11.9  . escitalopram (LEXAPRO) 20 MG tablet Take 1 tablet (20 mg total) by mouth daily.  . ferrous sulfate 325 (65 FE) MG tablet Take 1 tablet (325 mg total) by mouth 2 (two) times daily with a meal.  . glucose blood (TRUE METRIX BLOOD GLUCOSE TEST) test strip Check BS daily Dx E11.9  . hydrOXYzine  (ATARAX/VISTARIL) 10 MG tablet Take 1 tablet (10 mg total) by mouth 3 (three) times daily as needed for anxiety.  . levofloxacin (QUIXIN) 0.5 % ophthalmic solution Place 1 drop into the right eye every 6 (six) hours. For 7 days  . lisinopril (ZESTRIL) 10 MG tablet Take 1 tablet by mouth once daily  . lovastatin (MEVACOR) 20 MG tablet Take 1 tablet (20 mg total) by mouth at bedtime.  . metFORMIN (GLUCOPHAGE) 1000 MG tablet Take 1 tablet (1,000 mg total) by mouth 2 (two) times daily with a meal.  . oxyCODONE-acetaminophen (PERCOCET) 10-325 MG tablet Take 1 tablet by mouth every 6 (six) hours as needed for pain.  . pantoprazole (PROTONIX) 40 MG tablet Take 1 tablet (40 mg total) by mouth daily.  . TRUEplus Lancets 30G MISC Check BS daily Dx E11.9  . [DISCONTINUED] potassium chloride SA (KLOR-CON) 20 MEQ tablet Take 1 tablet (20 mEq total) by mouth 2 (two) times daily.  . [DISCONTINUED] torsemide (DEMADEX) 20 MG tablet Take 2 tablets (40 mg total) by mouth 2 (two) times daily.     Allergies:   Keflex [cephalexin]   Social History   Socioeconomic History  . Marital status: Widowed    Spouse name: Not on file  . Number of children: 2  . Years of education: 14  . Highest education level: Not on file  Occupational History  . Not on file  Tobacco Use  . Smoking status: Former Smoker    Packs/day: 1.50    Years: 40.00    Pack years: 60.00    Types: Cigarettes    Quit date: 04/29/2015    Years since quitting: 4.8  . Smokeless tobacco: Former User    Quit date: 04/29/2015  . Tobacco comment: Quit smoking 04/2015- Previous 1.5 ppd smoker  Vaping Use  . Vaping Use: Never used  Substance and Sexual Activity  . Alcohol use: No    Alcohol/week: 0.0 standard drinks  . Drug use: No  . Sexual activity: Not Currently    Birth control/protection: Post-menopausal  Other Topics Concern  . Not on file  Social History Narrative  . Not on file   Social Determinants of Health   Financial Resource  Strain: Low Risk   . Difficulty of Paying Living Expenses: Not very hard  Food Insecurity:   . Worried About Running Out of Food in the Last Year: Not on file  . Ran Out of Food in the Last Year: Not on file  Transportation Needs: No Transportation Needs  . Lack of Transportation (Medical): No  . Lack of Transportation (Non-Medical): No  Physical Activity: Inactive  . Days of Exercise per Week: 0 days  . Minutes of Exercise per Session: 0 min  Stress:   . Feeling of Stress :   Not on file  Social Connections:   . Frequency of Communication with Friends and Family: Not on file  . Frequency of Social Gatherings with Friends and Family: Not on file  . Attends Religious Services: Not on file  . Active Member of Clubs or Organizations: Not on file  . Attends Club or Organization Meetings: Not on file  . Marital Status: Not on file     Family History:  The patient's family history includes Anemia in her mother; COPD in her sister; Cancer (age of onset: 85) in her paternal grandmother; Diabetes in her father; Heart disease (age of onset: 75) in her father; Hypertension in her father.   ROS:   Please see the history of present illness.    ROS All other systems reviewed and are negative.   PHYSICAL EXAM:   VS:  BP 124/66   Pulse 92   Ht 5' 2" (1.575 m)   Wt 287 lb (130.2 kg)   SpO2 93%   BMI 52.49 kg/m   Physical Exam  GEN: Obese, in no acute distress  Neck: no JVD, carotid bruits, or masses Cardiac:RRR; no murmurs, rubs, or gallops  Respiratory:  clear to auscultation bilaterally, normal work of breathing GI: soft, nontender, nondistended, + BS Ext: +3-4 edema up to her knees Neuro:  Alert and Oriented x 3 Psych: euthymic mood, full affect  Wt Readings from Last 3 Encounters:  02/18/20 287 lb (130.2 kg)  01/25/20 296 lb 11.8 oz (134.6 kg)  01/22/20 (!) 308 lb 14.4 oz (140.1 kg)      Studies/Labs Reviewed:   EKG:  EKG is not ordered today.   Recent Labs: 05/29/2019: Pro B  Natriuretic peptide (BNP) 58.0 10/29/2019: TSH 2.090 01/22/2020: B Natriuretic Peptide 149.0 01/23/2020: ALT 8 01/26/2020: Hemoglobin 7.8; Platelets 158 02/07/2020: BUN 15; Creatinine, Ser 0.73; Magnesium 1.8; Potassium 3.7; Sodium 139   Lipid Panel    Component Value Date/Time   CHOL 122 10/29/2019 1644   TRIG 70 10/29/2019 1644   HDL 31 (L) 10/29/2019 1644   CHOLHDL 3.9 10/29/2019 1644   CHOLHDL 4.9 06/27/2014 0915   VLDL 25 06/27/2014 0915   LDLCALC 76 10/29/2019 1644    Additional studies/ records that were reviewed today include:   Echo 05/07/19 IMPRESSIONS     1. Left ventricular ejection fraction, by estimation, is 70 to 75%. The  left ventricle has hyperdynamic function. The left ventrical has no  regional wall motion abnormalities. There is mildly increased left  ventricular hypertrophy. Left ventricular  diastolic parameters are consistent with Grade I diastolic dysfunction  (impaired relaxation). Elevated left ventricular pressure.   2. Right ventricular systolic function is normal. The right ventricular  size is normal.   3. Left atrial size was mildly dilated.   4. Trivial mitral valve regurgitation.   5. The aortic valve has an indeterminant number of cusps. Aortic valve  regurgitation is not visualized. Mild aortic valve stenosis.   6. The inferior vena cava is normal in size with greater than 50%  respiratory variability, suggesting right atrial pressure of 3 mmHg.       ASSESSMENT:    1. Gastrointestinal hemorrhage, unspecified gastrointestinal hemorrhage type   2. Chronic respiratory failure with hypoxia (HCC)   3. Chronic diastolic (congestive) heart failure (HCC)   4. Essential hypertension   5. Hyperlipidemia, unspecified hyperlipidemia type   6. Type 2 diabetes mellitus with other specified complication, without long-term current use of insulin (HCC)      PLAN:    In order of problems listed above:  GI bleed with hemoglobin 3.7 on admission  received 5 units of packed RBCs and underwent endoscopy and colonoscopy by GI.  Very large polyp resected and pending biopsy results.  Anemia most likely related to chronic blood loss from polyp.  Chronic hypoxic respiratory failure no longer requiring home oxygen  Chronic diastolic CHF diuresed in the hospital with IV Lasix and sent home on Demadex.  Has gained 7 pounds back and has significant lower extremity edema.  Is watching her salt.  Suspect some of this may be lymphedema.  Will increase Demadex to 60 mg twice daily increase potassium to 40 mEq twice daily.  Follow-up in 2 weeks with be met and appointment  Essential hypertension controlled on lisinopril  HLD was on Mevacor  DM type II back on Metformin  Medication Adjustments/Labs and Tests Ordered: Current medicines are reviewed at length with the patient today.  Concerns regarding medicines are outlined above.  Medication changes, Labs and Tests ordered today are listed in the Patient Instructions below. Patient Instructions  Medication Instructions:  Your physician has recommended you make the following change in your medication:   Increase Torsemide to 60 mg Two times Daily  Increase Potassium 40 meq Two Times Daily    *If you need a refill on your cardiac medications before your next appointment, please call your pharmacy*   Lab Work: Your physician recommends that you return for lab work in: 2 Weeks   If you have labs (blood work) drawn today and your tests are completely normal, you will receive your results only by: . MyChart Message (if you have MyChart) OR . A paper copy in the mail If you have any lab test that is abnormal or we need to change your treatment, we will call you to review the results.   Testing/Procedures: NONE    Follow-Up: At [facility], you and your health needs are our priority.  As part of our continuing mission to provide you with exceptional heart care, we have created designated  Provider Care Teams.  These Care Teams include your primary Cardiologist (physician) and Advanced Practice Providers (APPs -  Physician Assistants and Nurse Practitioners) who all work together to provide you with the care you need, when you need it.  We recommend signing up for the patient portal called "MyChart".  Sign up information is provided on this After Visit Summary.  MyChart is used to connect with patients for Virtual Visits (Telemedicine).  Patients are able to view lab/test results, encounter notes, upcoming appointments, etc.  Non-urgent messages can be sent to your provider as well.   To learn more about what you can do with MyChart, go to [email or web address].    Your next appointment:   2 week(s)  The format for your next appointment:   In Person  Provider:   Michele Lenze, PA-C   Other Instructions Thank you for choosing [facility]!       Signed, Michele Lenze, PA-C  02/18/2020 2:09 PM    [facility] [address], [place], [place]  [address] Phone: [phone]; Fax: [phone]    

## 2020-02-06 ENCOUNTER — Telehealth: Payer: Self-pay

## 2020-02-06 DIAGNOSIS — M545 Low back pain, unspecified: Secondary | ICD-10-CM | POA: Diagnosis not present

## 2020-02-06 DIAGNOSIS — F1120 Opioid dependence, uncomplicated: Secondary | ICD-10-CM | POA: Diagnosis not present

## 2020-02-06 DIAGNOSIS — G8929 Other chronic pain: Secondary | ICD-10-CM | POA: Diagnosis not present

## 2020-02-06 DIAGNOSIS — G894 Chronic pain syndrome: Secondary | ICD-10-CM | POA: Diagnosis not present

## 2020-02-06 DIAGNOSIS — Z79899 Other long term (current) drug therapy: Secondary | ICD-10-CM | POA: Diagnosis not present

## 2020-02-06 NOTE — Telephone Encounter (Signed)
Pt.s daughter called to advise [place] that the pt just left another dr for an appt and when she gave a urine specimen it had blood in it. pts daughter stated that when you done her colonoscopy and endoscopy on 10/30  that if she has any bleeding to call our office. This is the first episode of this and they want further instructions from you. No pain was noted.

## 2020-02-07 ENCOUNTER — Telehealth: Payer: Self-pay

## 2020-02-07 ENCOUNTER — Telehealth: Payer: Self-pay | Admitting: Internal Medicine

## 2020-02-07 ENCOUNTER — Telehealth (INDEPENDENT_AMBULATORY_CARE_PROVIDER_SITE_OTHER): Payer: Medicare PPO | Admitting: Family Medicine

## 2020-02-07 ENCOUNTER — Other Ambulatory Visit: Payer: Medicare PPO

## 2020-02-07 DIAGNOSIS — E876 Hypokalemia: Secondary | ICD-10-CM

## 2020-02-07 DIAGNOSIS — R195 Other fecal abnormalities: Secondary | ICD-10-CM

## 2020-02-07 DIAGNOSIS — N3001 Acute cystitis with hematuria: Secondary | ICD-10-CM

## 2020-02-07 DIAGNOSIS — R82998 Other abnormal findings in urine: Secondary | ICD-10-CM

## 2020-02-07 DIAGNOSIS — D500 Iron deficiency anemia secondary to blood loss (chronic): Secondary | ICD-10-CM

## 2020-02-07 LAB — MICROSCOPIC EXAMINATION: RBC, Urine: >30 /hpf — ABNORMAL HIGH (ref 0–2)

## 2020-02-07 LAB — URINALYSIS, COMPLETE
Bilirubin, UA: NEGATIVE
Glucose, UA: NEGATIVE
Ketones, UA: NEGATIVE
Leukocytes,UA: NEGATIVE
Nitrite, UA: NEGATIVE
Specific Gravity, UA: >1.03 — ABNORMAL HIGH (ref 1.005–1.030)
Urobilinogen, Ur: 1 mg/dL (ref 0.2–1.0)
pH, UA: 5 (ref 5.0–7.5)

## 2020-02-07 LAB — HEMOGLOBIN, FINGERSTICK: Hemoglobin: 8.1 g/dL — ABNORMAL LOW (ref 11.1–15.9)

## 2020-02-07 MED ORDER — SULFAMETHOXAZOLE-TRIMETHOPRIM 800-160 MG PO TABS: 1.0000 | ORAL_TABLET | Freq: Two times a day (BID) | ORAL | 0 refills | Status: AC

## 2020-02-07 NOTE — Telephone Encounter (Signed)
Pt called saying her doctor had faxed [place] where it was showing blood in her urine and no one has called her back. Pt was told she needed to contact her PCP.

## 2020-02-07 NOTE — Telephone Encounter (Signed)
Patient called stating that she is 2 weeks out of the hospital from having a polyp and mass removed.  Patient states that she is passing blood in urine and I concerned about HGB dropping.  First available appt would be Tuesday 02/11/20 with MMM.  Patient unable to come at this time due to no transportation.  Patient would like to know if she could come in today just to have blood drawn and hgb checked.  Patient contacter Dr. Ave Filter office and was informed to contact PCP

## 2020-02-07 NOTE — Telephone Encounter (Signed)
Also this pts message had been sent to the dr to review.

## 2020-02-07 NOTE — Addendum Note (Signed)
Addended by: [name] on: 02/07/2020 02:24 PM   Modules accepted: Orders

## 2020-02-07 NOTE — Telephone Encounter (Addendum)
Telephone visit  Subjective: CC: Anemia PCP: [name], DO PFX:[id number] Savannah Burns is a 69 y.o. female calls for telephone consult today. Patient provides verbal consent for consult held via phone.  Due to COVID-19 pandemic this visit was conducted virtually. This visit type was conducted due to national recommendations for restrictions regarding the COVID-19 Pandemic (e.g. social distancing, sheltering in place) in an effort to limit this patient's exposure and mitigate transmission in our community. All issues noted in this document were discussed and addressed.  A physical exam was not performed with this format.   Location of patient: Home Location of provider: [facility] Others present for call: None  1.  Blood in urine Patient notes that she was recently seen by different provider and was noted to have "blood in her urine".  She was asked to follow-up with her PCP with regards to this.  To summarize, she was recently hospitalized and discharged at the end of October due to symptomatic anemia. She was found to have a hbg of 3.8 and had polyp and a mass removed in the hospital. She is taking iron and wonders if this has falsely triggered "blood in urine".  She did not take her iron today to see if perhaps it would clear up but she has not urinated today.  ROS: Per HPI  Allergies  Allergen Reactions  . Keflex [Cephalexin] Nausea And Vomiting   Past Medical History:  Diagnosis Date  . Adrenal adenoma, left    Stable  . Anxiety   . Arthritis    bilateral hands  . CHF (congestive heart failure) ([place])   . COPD (chronic obstructive pulmonary disease) ([place])   . Depression   . Diabetes mellitus without complication ([facility])   . Dyspnea   . Grade II diastolic dysfunction   . History of kidney stones   . Hyperlipidemia   . Hypertension   . Lower back pain   . Panic attacks   . Pneumonia    currently taking antibiotic and prednisone for early stages of pneumonia  . Pulmonary  nodules    bilateral  . Skin cancer    face    Current Outpatient Medications:  .  aspirin 81 MG tablet, Take 81 mg by mouth daily. Reported on 06/01/2015, Disp: , Rfl:  .  Blood Glucose Calibration (GLUCOMETER DEX HIGH CONTROL) LIQD, Test CBG's once daily.  (Patient not taking: Reported on 01/22/2020), Disp: , Rfl:  .  Blood Glucose Monitoring Suppl (TRUE METRIX AIR GLUCOSE METER) w/Device KIT, Check BS daily Dx E11.9, Disp: 1 kit, Rfl: 0 .  escitalopram (LEXAPRO) 20 MG tablet, Take 1 tablet (20 mg total) by mouth daily., Disp: 90 tablet, Rfl: 0 .  ferrous sulfate 325 (65 FE) MG tablet, Take 1 tablet (325 mg total) by mouth 2 (two) times daily with a meal., Disp: 60 tablet, Rfl: 1 .  glucose blood (TRUE METRIX BLOOD GLUCOSE TEST) test strip, Check BS daily Dx E11.9, Disp: 100 each, Rfl: 3 .  hydrOXYzine (ATARAX/VISTARIL) 10 MG tablet, Take 1 tablet (10 mg total) by mouth 3 (three) times daily as needed for anxiety., Disp: 60 tablet, Rfl: 2 .  levofloxacin (QUIXIN) 0.5 % ophthalmic solution, Place 1 drop into the right eye every 6 (six) hours. For 7 days, Disp: 5 mL, Rfl: 0 .  lisinopril (ZESTRIL) 10 MG tablet, Take 1 tablet (10 mg total) by mouth daily., Disp: 90 tablet, Rfl: 0 .  lovastatin (MEVACOR) 20 MG tablet, Take 1 tablet (20  mg total) by mouth at bedtime., Disp: 90 tablet, Rfl: 3 .  metFORMIN (GLUCOPHAGE) 1000 MG tablet, Take 1 tablet (1,000 mg total) by mouth 2 (two) times daily with a meal., Disp: 180 tablet, Rfl: 3 .  oxyCODONE-acetaminophen (PERCOCET) 10-325 MG tablet, Take 1 tablet by mouth every 6 (six) hours as needed for pain., Disp: , Rfl:  .  pantoprazole (PROTONIX) 40 MG tablet, Take 1 tablet (40 mg total) by mouth daily., Disp: 30 tablet, Rfl: 1 .  potassium chloride SA (KLOR-CON) 20 MEQ tablet, Take 1 tablet (20 mEq total) by mouth 2 (two) times daily., Disp: 60 tablet, Rfl: 3 .  torsemide (DEMADEX) 20 MG tablet, Take 2 tablets (40 mg total) by mouth 2 (two) times daily.,  Disp: 120 tablet, Rfl: 3 .  TRUEplus Lancets 30G MISC, Check BS daily Dx E11.9, Disp: 100 each, Rfl: 3  Assessment/ Plan: 69 y.o. female   We will need to make sure that we contact her daughter if we are unsuccessful in contacting the patient in the future.  I expressed to her that it was very important that we see her after hospitalization, especially in the setting of recent symptomatic anemia due to blood loss.  1. Heme positive stool - Hemoglobin, fingerstick; Future  2. Iron deficiency anemia due to chronic blood loss - Hemoglobin, fingerstick; Future  3. Dark urine - Hemoglobin, fingerstick; Future - Urinalysis, Complete; Future  4. Hypokalemia - Basic Metabolic Panel; Future - Magnesium; Future  ** Urinalysis with >30 RBCs, many bacteria and 11-30 WBC.  Going to treat her for urinary tract infection with Septra DS twice daily for the next 7 days.  Urine culture sent.  She will follow up if symptoms are not improving or if they abruptly worsen.  Meds ordered this encounter  Medications  . sulfamethoxazole-trimethoprim (BACTRIM DS) 800-160 MG tablet    Sig: Take 1 tablet by mouth 2 (two) times daily for 7 days.    Dispense:  14 tablet    Refill:  0     Start time: 11:53am End time: 12:04pm  Total time spent on patient care (including telephone call/ virtual visit): 11 minutes  Blackburn, Zena [phone]

## 2020-02-07 NOTE — Telephone Encounter (Signed)
Patient states she already spoke to Dr. Darnell Level.

## 2020-02-07 NOTE — Telephone Encounter (Signed)
I stressed the fact that she did not respond to the attempts for transition of care appointment.  I have done a virtual visit with the patient.  I have ordered labs and repeat urinalysis.  She will come in today for this

## 2020-02-08 LAB — BASIC METABOLIC PANEL
BUN/Creatinine Ratio: 21 (ref 12–28)
BUN: 15 mg/dL (ref 8–27)
CO2: 31 mmol/L — ABNORMAL HIGH (ref 20–29)
Calcium: 8.7 mg/dL (ref 8.7–10.3)
Chloride: 97 mmol/L (ref 96–106)
Creatinine, Ser: 0.73 mg/dL (ref 0.57–1.00)
GFR calc Af Amer: 96 mL/min/{1.73_m2} (ref >59)
GFR calc non Af Amer: 84 mL/min/{1.73_m2} (ref >59)
Glucose: 222 mg/dL — ABNORMAL HIGH (ref 65–99)
Potassium: 3.7 mmol/L (ref 3.5–5.2)
Sodium: 139 mmol/L (ref 134–144)

## 2020-02-08 LAB — MAGNESIUM: Magnesium: 1.8 mg/dL (ref 1.6–2.3)

## 2020-02-12 ENCOUNTER — Other Ambulatory Visit: Payer: Self-pay | Admitting: Family Medicine

## 2020-02-12 ENCOUNTER — Ambulatory Visit: Payer: Medicare PPO | Admitting: Licensed Clinical Social Worker

## 2020-02-12 DIAGNOSIS — E1159 Type 2 diabetes mellitus with other circulatory complications: Secondary | ICD-10-CM

## 2020-02-12 DIAGNOSIS — E119 Type 2 diabetes mellitus without complications: Secondary | ICD-10-CM | POA: Diagnosis not present

## 2020-02-12 DIAGNOSIS — D500 Iron deficiency anemia secondary to blood loss (chronic): Secondary | ICD-10-CM | POA: Diagnosis not present

## 2020-02-12 DIAGNOSIS — I10 Essential (primary) hypertension: Secondary | ICD-10-CM

## 2020-02-12 DIAGNOSIS — I5033 Acute on chronic diastolic (congestive) heart failure: Secondary | ICD-10-CM | POA: Diagnosis not present

## 2020-02-12 DIAGNOSIS — E785 Hyperlipidemia, unspecified: Secondary | ICD-10-CM

## 2020-02-12 DIAGNOSIS — J9611 Chronic respiratory failure with hypoxia: Secondary | ICD-10-CM | POA: Diagnosis not present

## 2020-02-12 DIAGNOSIS — F419 Anxiety disorder, unspecified: Secondary | ICD-10-CM | POA: Diagnosis not present

## 2020-02-12 DIAGNOSIS — I110 Hypertensive heart disease with heart failure: Secondary | ICD-10-CM | POA: Diagnosis not present

## 2020-02-12 DIAGNOSIS — E1169 Type 2 diabetes mellitus with other specified complication: Secondary | ICD-10-CM

## 2020-02-12 DIAGNOSIS — I152 Hypertension secondary to endocrine disorders: Secondary | ICD-10-CM

## 2020-02-12 DIAGNOSIS — F339 Major depressive disorder, recurrent, unspecified: Secondary | ICD-10-CM

## 2020-02-12 DIAGNOSIS — J449 Chronic obstructive pulmonary disease, unspecified: Secondary | ICD-10-CM

## 2020-02-12 DIAGNOSIS — J441 Chronic obstructive pulmonary disease with (acute) exacerbation: Secondary | ICD-10-CM | POA: Diagnosis not present

## 2020-02-12 DIAGNOSIS — I8500 Esophageal varices without bleeding: Secondary | ICD-10-CM | POA: Diagnosis not present

## 2020-02-12 LAB — URINE CULTURE

## 2020-02-12 NOTE — Chronic Care Management (AMB) (Signed)
Chronic Care Management    Clinical Social Work Follow Up Note  02/12/2020 Name: Savannah Burns MRN: [id number] DOB: [birth year]  Savannah Burns is a 69 y.o. year old female who is a primary care patient of [name], DO. The CCM team was consulted for assistance with [employer] .   Review of patient status, including review of consultants reports, other relevant assessments, and collaboration with appropriate care team members and the patient's provider was performed as part of comprehensive patient evaluation and provision of chronic care management services.    SDOH (Social Determinants of Health) assessments performed: No;risk for tobacco use; risk for depression; risk for physical inactivity; risk for financial strain; risk for stress    Chronic Care Management from 12/05/2019 in [facility]  PHQ-9 Total Score 6       GAD 7 : Generalized Anxiety Score 12/05/2019 12/22/2017  Nervous, Anxious, on Edge 1 1  Control/stop worrying 1 3  Worry too much - different things 1 1  Trouble relaxing 0 1  Restless 0 0  Easily annoyed or irritable 1 1  Afraid - awful might happen 0 3  Total GAD 7 Score 4 10  Anxiety Difficulty Somewhat difficult Somewhat difficult    Outpatient Encounter Medications as of 02/12/2020  Medication Sig Note  . aspirin 81 MG tablet Take 81 mg by mouth daily. Reported on 06/01/2015   . Blood Glucose Calibration (GLUCOMETER DEX HIGH CONTROL) LIQD Test CBG's once daily.  (Patient not taking: Reported on 01/22/2020)   . Blood Glucose Monitoring Suppl (TRUE METRIX AIR GLUCOSE METER) w/Device KIT Check BS daily Dx E11.9   . escitalopram (LEXAPRO) 20 MG tablet Take 1 tablet (20 mg total) by mouth daily.   . ferrous sulfate 325 (65 FE) MG tablet Take 1 tablet (325 mg total) by mouth 2 (two) times daily with a meal.   . glucose blood (TRUE METRIX BLOOD GLUCOSE TEST) test strip Check BS daily Dx E11.9   . hydrOXYzine  (ATARAX/VISTARIL) 10 MG tablet Take 1 tablet (10 mg total) by mouth 3 (three) times daily as needed for anxiety.   Marland Kitchen levofloxacin (QUIXIN) 0.5 % ophthalmic solution Place 1 drop into the right eye every 6 (six) hours. For 7 days   . lisinopril (ZESTRIL) 10 MG tablet Take 1 tablet (10 mg total) by mouth daily.   Marland Kitchen lovastatin (MEVACOR) 20 MG tablet Take 1 tablet (20 mg total) by mouth at bedtime.   . metFORMIN (GLUCOPHAGE) 1000 MG tablet Take 1 tablet (1,000 mg total) by mouth 2 (two) times daily with a meal.   . oxyCODONE-acetaminophen (PERCOCET) 10-325 MG tablet Take 1 tablet by mouth every 6 (six) hours as needed for pain.   . pantoprazole (PROTONIX) 40 MG tablet Take 1 tablet (40 mg total) by mouth daily.   . potassium chloride SA (KLOR-CON) 20 MEQ tablet Take 1 tablet (20 mEq total) by mouth 2 (two) times daily. 01/22/2020: Pt has not started taking yet.  . sulfamethoxazole-trimethoprim (BACTRIM DS) 800-160 MG tablet Take 1 tablet by mouth 2 (two) times daily for 7 days.   Marland Kitchen torsemide (DEMADEX) 20 MG tablet Take 2 tablets (40 mg total) by mouth 2 (two) times daily.   . TRUEplus Lancets 30G MISC Check BS daily Dx E11.9   . [DISCONTINUED] albuterol (PROVENTIL HFA;VENTOLIN HFA) 108 (90 Base) MCG/ACT inhaler Inhale 2 puffs into the lungs every 6 (six) hours as needed for wheezing or shortness of breath.  No facility-administered encounter medications on file as of 02/12/2020.    Goals    .  Client will talk with LCSW in next 30 days to discuss depression issues of client and manaement of depression issues (pt-stated)      CARE PLAN ENTRY   Current Barriers:  Patient with chronic diagnoses of Depression, Obesity, DM, HTN, COPD, DJD, HLD  Clinical Social Work Clinical Goal(s):  Marland Kitchen LCSW will call client in next 30 days to discuss depression issues of client and client management of depression issues  Interventions:   Talked with Yvone Neu, daughter of client, about client  needs  Talked with Caryl Pina about CCM program support . Talked with Caryl Pina about ADLs completion of client (she said she cannot step anymore into bathtub) . Talked with Caryl Pina about DME of client (has a walker with a seat, 3 in 1 bedside commode,oxygen system) . Talked with Caryl Pina about [employer] support . Talked with Caryl Pina about mood of client  . Talked with Caryl Pina about client mobility in home setting (she said her daughter is helping her to rearrange furniture in the home to help client with mobility) . Talked with Caryl Pina  about sleeping issues of client . Talked with Caryl Pina about edema issues of client . Talked with Caryl Pina about pain issues of client . Talked with client about social support network (daughter is supportive, grandchildren are supportive but are in school currently) . Talked with Caryl Pina about client's  upcoming medical appointments . Talked with Caryl Pina about decreased energy of client . Talked with Caryl Pina about portable oxygen use of client and client use of oxygen concentrator in the home . Talked with Caryl Pina about meal provision of client . Talked with Caryl Pina about family support for client . Talked with Caryl Pina about Triage Nurse at [facility] as nursing resource for client nursing needs   Patient Self Care Activities:   Eats meals with set up assistance   Patient Self Care Deficits:  Shortness of breath (breathing challenges, uses oxygen as needed) Mobility issues Transport challenges  Initial goal documentation     Follow Up Plan:  LCSW to call client /daughter of client in next 4 weeks to talk with client/daughter about depression issues of client and to talk about client  management of depression issues faced  [name].Shamanda Len MSW, LCSW Licensed Clinical Social Worker [facility] [phone]

## 2020-02-12 NOTE — Patient Instructions (Addendum)
Licensed Clinical Education officer, museum Visit Information  Goals we discussed today:     Client will talk with LCSW in next 30 days to discuss depression issues of client and manaement of depression issues (pt-stated)        CARE PLAN ENTRY   Current Barriers:  Patient with chronic diagnoses of Depression, Obesity, DM, HTN, COPD, DJD, HLD  Clinical Social Work Clinical Goal(s):   LCSW will call client in next 30 days to discuss depression issues of client and client management of depression issues  Interventions:   Talked with Yvone Neu, daughter of client, about client needs  Talked with Caryl Pina about CCM program support  Talked with Caryl Pina about ADLs completion of client (she said she cannot step anymore into bathtub)  Talked with Caryl Pina about DME of client (has a walker with a seat, 3 in 1 bedside commode,oxygen system)  Talked with Caryl Pina about [employer] support  Talked with Caryl Pina about mood of client   Talked with Caryl Pina about client mobility in home setting (she said her daughter is helping her to rearrange furniture in the home to help client with mobility)  Talked with Caryl Pina  about sleeping issues of client  Talked with Caryl Pina about edema issues of client  Talked with Caryl Pina about pain issues of client  Talked with client about social support network (daughter is supportive, grandchildren are supportive but are in school currently)  Talked with Caryl Pina about client's  upcoming medical appointments  Talked with Caryl Pina about decreased energy of client  Talked with Caryl Pina about portable oxygen use of client and client use of oxygen concentrator in the home  Talked with Caryl Pina about meal provision of client  Talked with Caryl Pina about family support for client  Talked with Caryl Pina about Triage Nurse at [facility] as nursing resource for client nursing needs   Patient Self Care Activities:   Eats meals with set up assistance   Patient Self Care  Deficits:  Shortness of breath (breathing challenges, uses oxygen as needed) Mobility issues Transport challenges  Initial goal documentation     Follow Up Plan: LCSW to call client /daughter of client in next 4 weeks to talk with client/daughter about depression issues of client and to talk about client  management of depression issues faced  Materials Provided: No  The patient/Ashley Hackley, daughter of patient, verbalized understanding of instructions provided today and declined a print copy of patient instruction materials.   [name].Jerardo Costabile MSW, LCSW Licensed Clinical Social Worker [facility] [phone]

## 2020-02-12 NOTE — Telephone Encounter (Signed)
Pt was advised last week she needed to call her pcp. She has already spoken with them and had labs/UA done.

## 2020-02-12 NOTE — Telephone Encounter (Signed)
Thank you Almyra Free. Unlikely this is related  to her CLN

## 2020-02-13 DIAGNOSIS — F419 Anxiety disorder, unspecified: Secondary | ICD-10-CM | POA: Diagnosis not present

## 2020-02-13 DIAGNOSIS — J441 Chronic obstructive pulmonary disease with (acute) exacerbation: Secondary | ICD-10-CM | POA: Diagnosis not present

## 2020-02-13 DIAGNOSIS — D500 Iron deficiency anemia secondary to blood loss (chronic): Secondary | ICD-10-CM | POA: Diagnosis not present

## 2020-02-13 DIAGNOSIS — E785 Hyperlipidemia, unspecified: Secondary | ICD-10-CM | POA: Diagnosis not present

## 2020-02-13 DIAGNOSIS — J9611 Chronic respiratory failure with hypoxia: Secondary | ICD-10-CM | POA: Diagnosis not present

## 2020-02-13 DIAGNOSIS — I110 Hypertensive heart disease with heart failure: Secondary | ICD-10-CM | POA: Diagnosis not present

## 2020-02-13 DIAGNOSIS — I5033 Acute on chronic diastolic (congestive) heart failure: Secondary | ICD-10-CM | POA: Diagnosis not present

## 2020-02-13 DIAGNOSIS — I8500 Esophageal varices without bleeding: Secondary | ICD-10-CM | POA: Diagnosis not present

## 2020-02-13 DIAGNOSIS — E119 Type 2 diabetes mellitus without complications: Secondary | ICD-10-CM | POA: Diagnosis not present

## 2020-02-14 ENCOUNTER — Other Ambulatory Visit: Payer: Self-pay

## 2020-02-14 ENCOUNTER — Ambulatory Visit (INDEPENDENT_AMBULATORY_CARE_PROVIDER_SITE_OTHER): Payer: Medicare PPO

## 2020-02-14 DIAGNOSIS — E785 Hyperlipidemia, unspecified: Secondary | ICD-10-CM

## 2020-02-14 DIAGNOSIS — I110 Hypertensive heart disease with heart failure: Secondary | ICD-10-CM

## 2020-02-14 DIAGNOSIS — M19042 Primary osteoarthritis, left hand: Secondary | ICD-10-CM

## 2020-02-14 DIAGNOSIS — J9611 Chronic respiratory failure with hypoxia: Secondary | ICD-10-CM | POA: Diagnosis not present

## 2020-02-14 DIAGNOSIS — F419 Anxiety disorder, unspecified: Secondary | ICD-10-CM

## 2020-02-14 DIAGNOSIS — D500 Iron deficiency anemia secondary to blood loss (chronic): Secondary | ICD-10-CM

## 2020-02-14 DIAGNOSIS — J441 Chronic obstructive pulmonary disease with (acute) exacerbation: Secondary | ICD-10-CM | POA: Diagnosis not present

## 2020-02-14 DIAGNOSIS — Z981 Arthrodesis status: Secondary | ICD-10-CM

## 2020-02-14 DIAGNOSIS — I350 Nonrheumatic aortic (valve) stenosis: Secondary | ICD-10-CM

## 2020-02-14 DIAGNOSIS — I8500 Esophageal varices without bleeding: Secondary | ICD-10-CM

## 2020-02-14 DIAGNOSIS — E119 Type 2 diabetes mellitus without complications: Secondary | ICD-10-CM

## 2020-02-14 DIAGNOSIS — M19041 Primary osteoarthritis, right hand: Secondary | ICD-10-CM

## 2020-02-14 DIAGNOSIS — Z9981 Dependence on supplemental oxygen: Secondary | ICD-10-CM

## 2020-02-14 DIAGNOSIS — Z79891 Long term (current) use of opiate analgesic: Secondary | ICD-10-CM

## 2020-02-14 DIAGNOSIS — Z7982 Long term (current) use of aspirin: Secondary | ICD-10-CM

## 2020-02-14 DIAGNOSIS — I5033 Acute on chronic diastolic (congestive) heart failure: Secondary | ICD-10-CM | POA: Diagnosis not present

## 2020-02-14 DIAGNOSIS — Z7984 Long term (current) use of oral hypoglycemic drugs: Secondary | ICD-10-CM

## 2020-02-14 DIAGNOSIS — R918 Other nonspecific abnormal finding of lung field: Secondary | ICD-10-CM

## 2020-02-14 DIAGNOSIS — Z48815 Encounter for surgical aftercare following surgery on the digestive system: Secondary | ICD-10-CM

## 2020-02-14 DIAGNOSIS — Z9181 History of falling: Secondary | ICD-10-CM

## 2020-02-14 DIAGNOSIS — Z87891 Personal history of nicotine dependence: Secondary | ICD-10-CM

## 2020-02-14 DIAGNOSIS — Z6841 Body Mass Index (BMI) 40.0 and over, adult: Secondary | ICD-10-CM

## 2020-02-18 ENCOUNTER — Ambulatory Visit: Payer: Medicare PPO | Admitting: Physician Assistant

## 2020-02-18 ENCOUNTER — Encounter: Payer: Self-pay | Admitting: Physician Assistant

## 2020-02-18 ENCOUNTER — Other Ambulatory Visit: Payer: Self-pay

## 2020-02-18 VITALS — BP 124/66 | HR 92 | Ht 62.0 in | Wt 287.0 lb

## 2020-02-18 DIAGNOSIS — E1169 Type 2 diabetes mellitus with other specified complication: Secondary | ICD-10-CM | POA: Diagnosis not present

## 2020-02-18 DIAGNOSIS — I5032 Chronic diastolic (congestive) heart failure: Secondary | ICD-10-CM | POA: Diagnosis not present

## 2020-02-18 DIAGNOSIS — K922 Gastrointestinal hemorrhage, unspecified: Secondary | ICD-10-CM | POA: Diagnosis not present

## 2020-02-18 DIAGNOSIS — J9611 Chronic respiratory failure with hypoxia: Secondary | ICD-10-CM

## 2020-02-18 DIAGNOSIS — E785 Hyperlipidemia, unspecified: Secondary | ICD-10-CM | POA: Diagnosis not present

## 2020-02-18 DIAGNOSIS — I10 Essential (primary) hypertension: Secondary | ICD-10-CM | POA: Diagnosis not present

## 2020-02-18 MED ORDER — POTASSIUM CHLORIDE CRYS ER 20 MEQ PO TBCR: 40.0000 meq | EXTENDED_RELEASE_TABLET | Freq: Two times a day (BID) | ORAL | 3 refills | Status: DC

## 2020-02-18 MED ORDER — TORSEMIDE 20 MG PO TABS: 60.0000 mg | ORAL_TABLET | Freq: Two times a day (BID) | ORAL | 11 refills | Status: DC

## 2020-02-18 NOTE — Patient Instructions (Signed)
Medication Instructions:  Your physician has recommended you make the following change in your medication:   Increase Torsemide to 60 mg Two times Daily  Increase Potassium 40 meq Two Times Daily    *If you need a refill on your cardiac medications before your next appointment, please call your pharmacy*   Lab Work: Your physician recommends that you return for lab work in: 2 Weeks   If you have labs (blood work) drawn today and your tests are completely normal, you will receive your results only by:  [employer] (if you have MyChart) OR  A paper copy in the mail If you have any lab test that is abnormal or we need to change your treatment, we will call you to review the results.   Testing/Procedures: NONE    Follow-Up: At [facility], you and your health needs are our priority.  As part of our continuing mission to provide you with exceptional heart care, we have created designated Provider Care Teams.  These Care Teams include your primary Cardiologist (physician) and Advanced Practice Providers (APPs -  Physician Assistants and Nurse Practitioners) who all work together to provide you with the care you need, when you need it.  We recommend signing up for the patient portal called "MyChart".  Sign up information is provided on this After Visit Summary.  MyChart is used to connect with patients for Virtual Visits (Telemedicine).  Patients are able to view lab/test results, encounter notes, upcoming appointments, etc.  Non-urgent messages can be sent to your provider as well.   To learn more about what you can do with MyChart, go to [email or web address].    Your next appointment:   2 week(s)  The format for your next appointment:   In Person  Provider:   Ermalinda Barrios, PA-C   Other Instructions Thank you for choosing [facility]!

## 2020-02-19 DIAGNOSIS — E785 Hyperlipidemia, unspecified: Secondary | ICD-10-CM | POA: Diagnosis not present

## 2020-02-19 DIAGNOSIS — I110 Hypertensive heart disease with heart failure: Secondary | ICD-10-CM | POA: Diagnosis not present

## 2020-02-19 DIAGNOSIS — I8500 Esophageal varices without bleeding: Secondary | ICD-10-CM | POA: Diagnosis not present

## 2020-02-19 DIAGNOSIS — J441 Chronic obstructive pulmonary disease with (acute) exacerbation: Secondary | ICD-10-CM | POA: Diagnosis not present

## 2020-02-19 DIAGNOSIS — I5033 Acute on chronic diastolic (congestive) heart failure: Secondary | ICD-10-CM | POA: Diagnosis not present

## 2020-02-19 DIAGNOSIS — F419 Anxiety disorder, unspecified: Secondary | ICD-10-CM | POA: Diagnosis not present

## 2020-02-19 DIAGNOSIS — D500 Iron deficiency anemia secondary to blood loss (chronic): Secondary | ICD-10-CM | POA: Diagnosis not present

## 2020-02-19 DIAGNOSIS — E119 Type 2 diabetes mellitus without complications: Secondary | ICD-10-CM | POA: Diagnosis not present

## 2020-02-19 DIAGNOSIS — J9611 Chronic respiratory failure with hypoxia: Secondary | ICD-10-CM | POA: Diagnosis not present

## 2020-02-24 DIAGNOSIS — J449 Chronic obstructive pulmonary disease, unspecified: Secondary | ICD-10-CM | POA: Diagnosis not present

## 2020-02-25 NOTE — Progress Notes (Deleted)
Cardiology Office Note    Date:  02/25/2020   ID:  Savannah Burns, DOB [birth year], MRN [id number]  PCP:  [name], DO  Cardiologist: Carlyle Dolly, MD EPS: None  No chief complaint on file.   History of Present Illness:  Savannah Burns is a 69 y.o. female with history of chronic diastolic CHF, COPD on home O2, HTN, HLD, and Type 2 DM, mild AS with normal LVEF and G1DD on echo 05/07/19.   Patient saw PCP 01/08/2020 with lower extremity edema. She was told to wear compression stockings but she never got them. She had stopped taking all of her medications for 6 months but resumed furosemide 76monthago but no other meds. She increased it to 60 mg twice daily for 1 week but did not help. Not urinating much and doesn't think it's working. Up 15 lbs. Not taking lisinopril or lovastatin. Watches her salt.    I checked labs and Hbg was 3.7 and sent to ED. endoscopy 01/25/2020 showed small esophageal varices with no bleeding, normal stomach.  Colonoscopy had 3 polyps 5 mm 154mand 30 mm that were taken out and awaiting path.  She was transfused 5 units of PRBCs.  She did receive IV Lasix in the hospital.  I saw the patient 02/18/2020 and she had increased swelling and weight was up to 287 7 pounds from the hospital.  Overall she was down from 308 pounds when she went into the hospital.  She was able to come off oxygen and is taking better care of herself.  I increased her Demadex to 60 mg twice daily and potassium 40 mEq twice daily.  Felt like some of it was lymphedema.  Past Medical History:  Diagnosis Date  . Adrenal adenoma, left    Stable  . Anxiety   . Arthritis    bilateral hands  . CHF (congestive heart failure) ([facility]  . COPD (chronic obstructive pulmonary disease) ([facility]  . Depression   . Diabetes mellitus without complication ([facility]  . Dyspnea   . Grade II diastolic dysfunction   . History of kidney stones   . Hyperlipidemia   . Hypertension   . Lower  back pain   . Panic attacks   . Pneumonia    currently taking antibiotic and prednisone for early stages of pneumonia  . Pulmonary nodules    bilateral  . Skin cancer    face    Past Surgical History:  Procedure Laterality Date  . Breast Cystectomy  Right   . CESAREAN SECTION    . COLONOSCOPY WITH PROPOFOL N/A 01/25/2020   Procedure: COLONOSCOPY WITH PROPOFOL;  Surgeon: CaEloise HarmanDO;  Location[facility];  Service: Endoscopy;  Laterality: N/A;  . CYSTOSCOPY/URETEROSCOPY/HOLMIUM LASER/STENT PLACEMENT Bilateral 03/01/2019   Procedure: CYSTOSCOPY/RETROGRADEURETEROSCOPY/HOLMIUM LASER/STENT PLACEMENT;  Surgeon: WiCeasar MonsMD;  Location: [facility];  Service: Urology;  Laterality: Bilateral;  ONLY NEEDS 60 MIN  . ESOPHAGOGASTRODUODENOSCOPY (EGD) WITH PROPOFOL N/A 01/25/2020   Procedure: ESOPHAGOGASTRODUODENOSCOPY (EGD) WITH PROPOFOL;  Surgeon: CaEloise HarmanDO;  Location[facility];  Service: Endoscopy;  Laterality: N/A;  . POLYPECTOMY  01/25/2020   Procedure: POLYPECTOMY;  Surgeon: CaEloise HarmanDO;  Location: [facility];  Service: Endoscopy;;  . SKIN CANCER EXCISION     Face  . SPINE SURGERY    . SUBMUCOSAL TATTOO INJECTION  01/25/2020   Procedure: SUBMUCOSAL TATTOO INJECTION;  Surgeon: CaEloise HarmanDO;  Location: [facility]  ENDO SUITE;  Service: Endoscopy;;    Current Medications: No outpatient medications have been marked as taking for the 03/02/20 encounter (Appointment) with Savannah Burn, PA-C.     Allergies:   Keflex [cephalexin]   Social History   Socioeconomic History  . Marital status: Widowed    Spouse name: Not on file  . Number of children: 2  . Years of education: 24  . Highest education level: Not on file  Occupational History  . Not on file  Tobacco Use  . Smoking status: Former Smoker    Packs/day: 1.50    Years: 40.00    Pack years: 60.00    Types: Cigarettes    Quit date: 04/29/2015    Years since quitting: 4.8   . Smokeless tobacco: Former Systems developer    Quit date: 04/29/2015  . Tobacco comment: Quit smoking 04/2015- Previous 1.5 ppd smoker  Vaping Use  . Vaping Use: Never used  Substance and Sexual Activity  . Alcohol use: No    Alcohol/week: 0.0 standard drinks  . Drug use: No  . Sexual activity: Not Currently    Birth control/protection: Post-menopausal  Other Topics Concern  . Not on file  Social History Narrative  . Not on file   Social Determinants of Health   Financial Resource Strain: Low Risk   . Difficulty of Paying Living Expenses: Not very hard  Food Insecurity:   . Worried About Charity fundraiser in the Last Year: Not on file  . Ran Out of Food in the Last Year: Not on file  Transportation Needs: No Transportation Needs  . Lack of Transportation (Medical): No  . Lack of Transportation (Non-Medical): No  Physical Activity: Inactive  . Days of Exercise per Week: 0 days  . Minutes of Exercise per Session: 0 min  Stress:   . Feeling of Stress : Not on file  Social Connections:   . Frequency of Communication with Friends and Family: Not on file  . Frequency of Social Gatherings with Friends and Family: Not on file  . Attends Religious Services: Not on file  . Active Member of Clubs or Organizations: Not on file  . Attends Archivist Meetings: Not on file  . Marital Status: Not on file     Family History:  The patient's ***family history includes Anemia in her mother; COPD in her sister; Cancer (age of onset: 58) in her paternal grandmother; Diabetes in her father; Heart disease (age of onset: 30) in her father; Hypertension in her father.   ROS:   Please see the history of present illness.    ROS All other systems reviewed and are negative.   PHYSICAL EXAM:   VS:  There were no vitals taken for this visit.  Physical Exam  GEN: Well nourished, well developed, in no acute distress  HEENT: normal  Neck: no JVD, carotid bruits, or masses Cardiac:RRR; no murmurs,  rubs, or gallops  Respiratory:  clear to auscultation bilaterally, normal work of breathing GI: soft, nontender, nondistended, + BS Ext: without cyanosis, clubbing, or edema, Good distal pulses bilaterally MS: no deformity or atrophy  Skin: warm and dry, no rash Neuro:  Alert and Oriented x 3, Strength and sensation are intact Psych: euthymic mood, full affect  Wt Readings from Last 3 Encounters:  02/18/20 287 lb (130.2 kg)  01/25/20 296 lb 11.8 oz (134.6 kg)  01/22/20 (!) 308 lb 14.4 oz (140.1 kg)      Studies/Labs Reviewed:  EKG:  EKG is*** ordered today.  The ekg ordered today demonstrates ***  Recent Labs: 05/29/2019: Pro B Natriuretic peptide (BNP) 58.0 10/29/2019: TSH 2.090 01/22/2020: B Natriuretic Peptide 149.0 01/23/2020: ALT 8 01/26/2020: Hemoglobin 7.8; Platelets 158 02/07/2020: BUN 15; Creatinine, Ser 0.73; Magnesium 1.8; Potassium 3.7; Sodium 139   Lipid Panel    Component Value Date/Time   CHOL 122 10/29/2019 1644   TRIG 70 10/29/2019 1644   HDL 31 (L) 10/29/2019 1644   CHOLHDL 3.9 10/29/2019 1644   CHOLHDL 4.9 06/27/2014 0915   VLDL 25 06/27/2014 0915   LDLCALC 76 10/29/2019 1644    Additional studies/ records that were reviewed today include:  Echo 05/07/19 IMPRESSIONS     1. Left ventricular ejection fraction, by estimation, is 70 to 75%. The  left ventricle has hyperdynamic function. The left ventrical has no  regional wall motion abnormalities. There is mildly increased left  ventricular hypertrophy. Left ventricular  diastolic parameters are consistent with Grade I diastolic dysfunction  (impaired relaxation). Elevated left ventricular pressure.   2. Right ventricular systolic function is normal. The right ventricular  size is normal.   3. Left atrial size was mildly dilated.   4. Trivial mitral valve regurgitation.   5. The aortic valve has an indeterminant number of cusps. Aortic valve  regurgitation is not visualized. Mild aortic valve  stenosis.   6. The inferior vena cava is normal in size with greater than 50%  respiratory variability, suggesting right atrial pressure of 3 mmHg.          ASSESSMENT:    No diagnosis found.   PLAN:  In order of problems listed above:  Chronic diastolic CHF weight was up to 287 last office visit and Demadex increased to 60 mg twice daily  GI bleed hemoglobin 3.7 requiring 5 units of packed RBCs anemia most likely related to chronic blood loss from polyp  Essential hypertension  Chronic hypoxic respiratory failure now off oxygen  HLD  DM type II back on Metformin   Medication Adjustments/Labs and Tests Ordered: Current medicines are reviewed at length with the patient today.  Concerns regarding medicines are outlined above.  Medication changes, Labs and Tests ordered today are listed in the Patient Instructions below. There are no Patient Instructions on file for this visit.   Sumner Boast, PA-C  02/25/2020 3:28 PM    [facility] [place], [place], [place]  [id number] Phone: [phone]; Fax: [phone]

## 2020-02-28 ENCOUNTER — Ambulatory Visit: Payer: Medicare PPO | Admitting: *Deleted

## 2020-02-28 DIAGNOSIS — D500 Iron deficiency anemia secondary to blood loss (chronic): Secondary | ICD-10-CM

## 2020-02-28 DIAGNOSIS — I5032 Chronic diastolic (congestive) heart failure: Secondary | ICD-10-CM

## 2020-02-28 NOTE — Patient Instructions (Signed)
Visit Information  Goals Addressed            This Visit's Progress   . Improve My Nutrition       Patient Activities:   . choose iron-rich foods . don't eat dairy products, such as milk, cheese and ice cream with those high in iron . eat dark, leafy greens, such as spinach, chard and collards . include high-iron food, such as meat, chicken, fish, shellfish, dried fruit, beans and nuts in each meal . pair iron-rich foods with foods high in vitamin C, such as oranges, strawberries and red peppers     Why is this important?    Low iron in the blood may cause anemia.   Preventing this keeps you healthy and strong.   It is best to eat foods that are high in iron and B vitamins.   Also, foods and drinks that help the body use the iron and vitamins are good for you.     Notes:     Marland Kitchen Manage Constipation       Patient Activities:  . be active every day . call the doctor or nurse if constipation doesn't get better . eat 3 to 5 servings of fruits and vegetables each day . eat raw fruit and vegetables every day . set a daily activity goal . take a stool softener    Why is this important?    Eating foods that are high in iron can lead to hard, dry stool.   Taking iron pills or vitamins can also cause this to happen.   Making a few simple changes can help.    Notes:        The patient verbalized understanding of instructions, educational materials, and care plan provided today and declined offer to receive copy of patient instructions, educational materials, and care plan.   Follow-up Plan:  The patient has been provided with contact information for the care management team and has been advised to call with any health related questions or concerns.  The care management team will reach out to the patient again over the next 15 days.  The patient will call gastroenterology office* as advised to schedule an appt ASAP.    [name], BSN, RN-BC Embedded Chronic Care  Manager [facility] / [facility] Direct Dial: [phone]

## 2020-02-28 NOTE — Chronic Care Management (AMB) (Signed)
Chronic Care Management   Follow Up Note   02/28/2020 Name: Savannah Burns MRN: [id number] DOB: 11-24-1950  Referred by: [name], DO Reason for referral : Chronic Care Management (RN follow up)   [name] is a 69 y.o. year old female who is a primary care patient of [name], DO. The CCM team was consulted for assistance with chronic disease management and care coordination needs.    Review of patient status, including review of consultants reports, relevant laboratory and other test results, and collaboration with appropriate care team members and the patient's provider was performed as part of comprehensive patient evaluation and provision of chronic care management services.    SDOH (Social Determinants of Health) assessments performed: No See Care Plan activities for detailed interventions related to [facility])     Outpatient Encounter Medications as of 02/28/2020  Medication Sig  . aspirin 81 MG tablet Take 81 mg by mouth daily. Reported on 06/01/2015  . Blood Glucose Calibration (GLUCOMETER DEX HIGH CONTROL) LIQD Test CBG's once daily.   . Blood Glucose Monitoring Suppl (TRUE METRIX AIR GLUCOSE METER) w/Device KIT Check BS daily Dx E11.9  . escitalopram (LEXAPRO) 20 MG tablet Take 1 tablet (20 mg total) by mouth daily.  . ferrous sulfate 325 (65 FE) MG tablet Take 1 tablet (325 mg total) by mouth 2 (two) times daily with a meal.  . glucose blood (TRUE METRIX BLOOD GLUCOSE TEST) test strip Check BS daily Dx E11.9  . hydrOXYzine (ATARAX/VISTARIL) 10 MG tablet Take 1 tablet (10 mg total) by mouth 3 (three) times daily as needed for anxiety.  Marland Kitchen levofloxacin (QUIXIN) 0.5 % ophthalmic solution Place 1 drop into the right eye every 6 (six) hours. For 7 days  . lisinopril (ZESTRIL) 10 MG tablet Take 1 tablet by mouth once daily  . lovastatin (MEVACOR) 20 MG tablet Take 1 tablet (20 mg total) by mouth at bedtime.  . metFORMIN (GLUCOPHAGE) 1000 MG tablet Take 1  tablet (1,000 mg total) by mouth 2 (two) times daily with a meal.  . oxyCODONE-acetaminophen (PERCOCET) 10-325 MG tablet Take 1 tablet by mouth every 6 (six) hours as needed for pain.  . pantoprazole (PROTONIX) 40 MG tablet Take 1 tablet (40 mg total) by mouth daily.  . potassium chloride SA (KLOR-CON) 20 MEQ tablet Take 2 tablets (40 mEq total) by mouth 2 (two) times daily.  Marland Kitchen torsemide (DEMADEX) 20 MG tablet Take 3 tablets (60 mg total) by mouth 2 (two) times daily.  . TRUEplus Lancets 30G MISC Check BS daily Dx E11.9  . [DISCONTINUED] albuterol (PROVENTIL HFA;VENTOLIN HFA) 108 (90 Base) MCG/ACT inhaler Inhale 2 puffs into the lungs every 6 (six) hours as needed for wheezing or shortness of breath.   No facility-administered encounter medications on file as of 02/28/2020.    Lab Results  Component Value Date   WBC 9.5 01/26/2020   HGB 7.8 (L) 01/26/2020   HCT 27.8 (L) 01/26/2020   MCV 84.0 01/26/2020   PLT 158 01/26/2020   Lab Results  Component Value Date   HGB 7.8 (L) 01/26/2020   Lab Results  Component Value Date   IRON 14 (L) 01/22/2020   TIBC 473 (H) 01/22/2020   FERRITIN 4 (L) 01/22/2020      Patient Care Plan: Iron Deficiency (Adult)    Problem Identified: Iron Deficiency   Priority: High  Note:   Iron deficiency anemia in a patient with depression, Obesity, DM, HTN, COPD, DJD, HLD, CHF  Goal: Anemia Management   Start Date: 02/28/2020  This Visit's Progress: Not on track  Priority: High  Note:   Current Barriers:  . Patient has not followed-up with GI as instructed due to concerns that pathology results may be positive for cancer. . Taking 1/2 of recommended iron dose due to constipation   Nurse Case Manager Clinical Goal(s):  Marland Kitchen Over the next 5 days, patient will talk with GI office to schedule a follow-up visit with gastroenterologist . Over the next week, patient will have repeat blood work as recommended by cardiologist  . Over the next 3 weeks, patient  will talk with [place] and verbalize understanding of plan of care for anemia management   Interventions:  . Inter-disciplinary care team collaboration (see longitudinal plan of care) . Chart reviewed including recent office notes, surgical reports, pathology reports, and lab results . Discussed recent lab results with patient and advised that pathology report was negative for dysplasia or malignancy. Patient was very relieved. . Strongly encouraged patient to reach out to GI office to schedule a follow-up ASAP. Patient is agreeable. . Advised patient that she needs repeat BMP per cardio in one week and repeat anemia panel ASAP . Asked patient to call RN Care Manager if she has any problems scheduling GI Appt . Reviewed and discussed medications: Iron replacement is causing constipation and she has cut the dose in half . Recommended an iron rich diet . Discussed constipation treatment and prevention . Discussed s/s of worsening anemia . Encouraged patient to reach out to [facility] as needed . Advised patient to seek emergency medical attention if necessary  Patient Self Care Activities:  . Self administers medications as prescribed . Call provider office for new concerns or questions . be active every day . call the doctor or nurse if constipation doesn't get better . eat 3 to 5 servings of fruits and vegetables each day . eat raw fruit and vegetables every day . set a daily activity goal . take a stool softener  . choose iron-rich foods . don't eat dairy products, such as milk, cheese and ice cream with those high in iron . eat dark, leafy greens, such as spinach, chard and collards . include high-iron food, such as meat, chicken, fish, shellfish, dried fruit, beans and nuts in each meal . pair iron-rich foods with foods high in vitamin C, such as oranges, strawberries and red peppers   Initial goal documentation      Plan:  The patient has been provided with  contact information for the care management team and has been advised to call with any health related questions or concerns.  The care management team will reach out to the patient again over the next 15 days.  The patient will call gastroenterology office* as advised to schedule an appt ASAP.    [name], BSN, RN-BC Embedded Chronic Care Manager [facility] [place] [facility] Direct Dial: [phone]

## 2020-03-02 ENCOUNTER — Ambulatory Visit: Payer: Medicare PPO | Admitting: Physician Assistant

## 2020-03-02 DIAGNOSIS — I5032 Chronic diastolic (congestive) heart failure: Secondary | ICD-10-CM

## 2020-03-02 DIAGNOSIS — E785 Hyperlipidemia, unspecified: Secondary | ICD-10-CM

## 2020-03-02 DIAGNOSIS — E1169 Type 2 diabetes mellitus with other specified complication: Secondary | ICD-10-CM

## 2020-03-02 DIAGNOSIS — I10 Essential (primary) hypertension: Secondary | ICD-10-CM

## 2020-03-02 DIAGNOSIS — K922 Gastrointestinal hemorrhage, unspecified: Secondary | ICD-10-CM

## 2020-03-02 DIAGNOSIS — J9611 Chronic respiratory failure with hypoxia: Secondary | ICD-10-CM

## 2020-03-03 DIAGNOSIS — Z79899 Other long term (current) drug therapy: Secondary | ICD-10-CM | POA: Diagnosis not present

## 2020-03-03 DIAGNOSIS — M545 Low back pain, unspecified: Secondary | ICD-10-CM | POA: Diagnosis not present

## 2020-03-03 DIAGNOSIS — G894 Chronic pain syndrome: Secondary | ICD-10-CM | POA: Diagnosis not present

## 2020-03-03 DIAGNOSIS — G8929 Other chronic pain: Secondary | ICD-10-CM | POA: Diagnosis not present

## 2020-03-09 ENCOUNTER — Encounter: Payer: Self-pay | Admitting: *Deleted

## 2020-03-11 ENCOUNTER — Telehealth: Payer: Self-pay | Admitting: *Deleted

## 2020-03-11 NOTE — Telephone Encounter (Signed)
VM from [place] [facility] with [facility] Pt was discharged from PT after several attempts to unsuccessfully reach patient

## 2020-03-16 ENCOUNTER — Telehealth: Payer: Self-pay

## 2020-03-16 NOTE — Telephone Encounter (Signed)
Patient states that Dr. Darnell Level will put orders for her to come in and get her hemoglobin checked. Tried to get patient to schedule an appointment- patient declined  Advised patient that Dr. Darnell Level is out today Wanted me to send message to her anyway. Please advise

## 2020-03-17 ENCOUNTER — Other Ambulatory Visit: Payer: Self-pay

## 2020-03-17 ENCOUNTER — Other Ambulatory Visit: Payer: Medicare PPO

## 2020-03-17 ENCOUNTER — Other Ambulatory Visit: Payer: Self-pay | Admitting: Family Medicine

## 2020-03-17 DIAGNOSIS — D500 Iron deficiency anemia secondary to blood loss (chronic): Secondary | ICD-10-CM | POA: Diagnosis not present

## 2020-03-17 LAB — HEMOGLOBIN, FINGERSTICK: Hemoglobin: 7.2 g/dL — ABNORMAL LOW (ref 11.1–15.9)

## 2020-03-17 NOTE — Telephone Encounter (Signed)
Order is in. Please make sure she has normal schedule OV for DM

## 2020-03-17 NOTE — Telephone Encounter (Signed)
Patient aware and verbalizes understanding. 

## 2020-03-18 ENCOUNTER — Telehealth: Payer: Self-pay

## 2020-03-18 NOTE — Telephone Encounter (Signed)
Please call the patient and let her know with a hgb declined to 7.2 (was previously up to 8.1) and shortness of breath/fatigue when walking she should go to the ER to be evaluated. She was having blood in her urine. Not for sure if this is GI but she needs to at least be evaluated, may need a unit or two of blood.

## 2020-03-18 NOTE — Telephone Encounter (Signed)
Noted. Spoke with pt. Pt was notified of ED evaluation and recommendations needed per Walden Field, NP.

## 2020-03-18 NOTE — Telephone Encounter (Signed)
Pt was seen by her PCP yesterday and pts h/h (7.2) was checked. Pt was asked to follow up with GI. Pt hasn't been seen in office yet. Pt went to the ED and has and EGD/TCS with Dr. Abbey Chatters. Pt isn't feeling any lightheadedness, but says she is getting tired when walking (SOB). Please advise.

## 2020-03-19 ENCOUNTER — Ambulatory Visit: Payer: Medicare PPO | Admitting: Licensed Clinical Social Worker

## 2020-03-19 ENCOUNTER — Other Ambulatory Visit: Payer: Self-pay

## 2020-03-19 ENCOUNTER — Encounter (HOSPITAL_COMMUNITY): Payer: Self-pay | Admitting: Emergency Medicine

## 2020-03-19 ENCOUNTER — Observation Stay (HOSPITAL_COMMUNITY)
Admission: EM | Admit: 2020-03-19 | Discharge: 2020-03-20 | Disposition: A | Discharge status: Home / Self Care | Payer: Medicare PPO | Attending: Family Medicine | Admitting: Family Medicine

## 2020-03-19 DIAGNOSIS — R195 Other fecal abnormalities: Secondary | ICD-10-CM | POA: Diagnosis not present

## 2020-03-19 DIAGNOSIS — Z20822 Contact with and (suspected) exposure to covid-19: Secondary | ICD-10-CM | POA: Diagnosis not present

## 2020-03-19 DIAGNOSIS — I10 Essential (primary) hypertension: Secondary | ICD-10-CM | POA: Diagnosis not present

## 2020-03-19 DIAGNOSIS — E119 Type 2 diabetes mellitus without complications: Secondary | ICD-10-CM | POA: Insufficient documentation

## 2020-03-19 DIAGNOSIS — Z87891 Personal history of nicotine dependence: Secondary | ICD-10-CM | POA: Insufficient documentation

## 2020-03-19 DIAGNOSIS — K922 Gastrointestinal hemorrhage, unspecified: Principal | ICD-10-CM

## 2020-03-19 DIAGNOSIS — E1169 Type 2 diabetes mellitus with other specified complication: Secondary | ICD-10-CM

## 2020-03-19 DIAGNOSIS — R531 Weakness: Secondary | ICD-10-CM | POA: Diagnosis present

## 2020-03-19 DIAGNOSIS — D649 Anemia, unspecified: Secondary | ICD-10-CM

## 2020-03-19 DIAGNOSIS — Z79899 Other long term (current) drug therapy: Secondary | ICD-10-CM | POA: Diagnosis not present

## 2020-03-19 DIAGNOSIS — R000 Tachycardia, unspecified: Secondary | ICD-10-CM | POA: Diagnosis not present

## 2020-03-19 DIAGNOSIS — Z7982 Long term (current) use of aspirin: Secondary | ICD-10-CM | POA: Insufficient documentation

## 2020-03-19 DIAGNOSIS — J449 Chronic obstructive pulmonary disease, unspecified: Secondary | ICD-10-CM

## 2020-03-19 DIAGNOSIS — F339 Major depressive disorder, recurrent, unspecified: Secondary | ICD-10-CM

## 2020-03-19 HISTORY — DX: Esophageal varices without bleeding: I85.00

## 2020-03-19 HISTORY — DX: Gastrointestinal hemorrhage, unspecified: K92.2

## 2020-03-19 LAB — CBC WITH DIFFERENTIAL/PLATELET
Abs Immature Granulocytes: 0.03 10*3/uL (ref 0.00–0.07)
Basophils Absolute: 0 10*3/uL (ref 0.0–0.1)
Basophils Relative: 0 %
Eosinophils Absolute: 0.1 10*3/uL (ref 0.0–0.5)
Eosinophils Relative: 1 %
HCT: 23.6 % — ABNORMAL LOW (ref 36.0–46.0)
Hemoglobin: 6.5 g/dL — CL (ref 12.0–15.0)
Immature Granulocytes: 1 %
Lymphocytes Relative: 28 %
Lymphs Abs: 1.8 10*3/uL (ref 0.7–4.0)
MCH: 23.7 pg — ABNORMAL LOW (ref 26.0–34.0)
MCHC: 27.5 g/dL — ABNORMAL LOW (ref 30.0–36.0)
MCV: 86.1 fL (ref 80.0–100.0)
Monocytes Absolute: 0.7 10*3/uL (ref 0.1–1.0)
Monocytes Relative: 11 %
Neutro Abs: 3.7 10*3/uL (ref 1.7–7.7)
Neutrophils Relative %: 59 %
Platelets: 145 10*3/uL — ABNORMAL LOW (ref 150–400)
RBC: 2.74 MIL/uL — ABNORMAL LOW (ref 3.87–5.11)
RDW: 19.3 % — ABNORMAL HIGH (ref 11.5–15.5)
WBC: 6.3 10*3/uL (ref 4.0–10.5)
nRBC: 0 % (ref 0.0–0.2)

## 2020-03-19 LAB — COMPREHENSIVE METABOLIC PANEL
ALT: 17 U/L (ref 0–44)
AST: 28 U/L (ref 15–41)
Albumin: 2.8 g/dL — ABNORMAL LOW (ref 3.5–5.0)
Alkaline Phosphatase: 86 U/L (ref 38–126)
Anion gap: 8 (ref 5–15)
BUN: 14 mg/dL (ref 8–23)
CO2: 31 mmol/L (ref 22–32)
Calcium: 8.2 mg/dL — ABNORMAL LOW (ref 8.9–10.3)
Chloride: 97 mmol/L — ABNORMAL LOW (ref 98–111)
Creatinine, Ser: 0.73 mg/dL (ref 0.44–1.00)
GFR, Estimated: >60 mL/min (ref >60)
Glucose, Bld: 165 mg/dL — ABNORMAL HIGH (ref 70–99)
Potassium: 3.8 mmol/L (ref 3.5–5.1)
Sodium: 136 mmol/L (ref 135–145)
Total Bilirubin: 0.4 mg/dL (ref 0.3–1.2)
Total Protein: 8 g/dL (ref 6.5–8.1)

## 2020-03-19 LAB — RESP PANEL BY RT-PCR (FLU A&B, COVID) ARPGX2
Influenza A by PCR: NEGATIVE
Influenza B by PCR: NEGATIVE
SARS Coronavirus 2 by RT PCR: NEGATIVE

## 2020-03-19 LAB — LIPASE, BLOOD: Lipase: 45 U/L (ref 11–51)

## 2020-03-19 LAB — PROTIME-INR
INR: 1 (ref 0.8–1.2)
Prothrombin Time: 12.6 seconds (ref 11.4–15.2)

## 2020-03-19 LAB — PREPARE RBC (CROSSMATCH)

## 2020-03-19 MED ORDER — OXYCODONE-ACETAMINOPHEN 10-325 MG PO TABS: 1.0000 | ORAL_TABLET | Freq: Four times a day (QID) | ORAL | Status: DC | PRN

## 2020-03-19 MED ORDER — PANTOPRAZOLE SODIUM 40 MG PO TBEC
40.0000 mg | DELAYED_RELEASE_TABLET | Freq: Two times a day (BID) | ORAL | Status: DC
  Administered 2020-03-19 – 2020-03-20 (×2): 40 mg via ORAL
  Filled 2020-03-19 (×2): qty 1

## 2020-03-19 MED ORDER — HYDROXYZINE HCL 10 MG PO TABS
10.0000 mg | ORAL_TABLET | Freq: Three times a day (TID) | ORAL | Status: DC | PRN
  Filled 2020-03-19: qty 1

## 2020-03-19 MED ORDER — ASPIRIN EC 81 MG PO TBEC
81.0000 mg | DELAYED_RELEASE_TABLET | Freq: Every day | ORAL | Status: DC
  Administered 2020-03-20: 81 mg via ORAL
  Filled 2020-03-19: qty 1

## 2020-03-19 MED ORDER — HYDRALAZINE HCL 25 MG PO TABS: 25.0000 mg | ORAL_TABLET | Freq: Four times a day (QID) | ORAL | Status: DC | PRN

## 2020-03-19 MED ORDER — SODIUM CHLORIDE 0.9 % IV SOLN: 10.0000 mL/h | Freq: Once | INTRAVENOUS | Status: DC

## 2020-03-19 MED ORDER — ESCITALOPRAM OXALATE 10 MG PO TABS
20.0000 mg | ORAL_TABLET | Freq: Every day | ORAL | Status: DC
  Administered 2020-03-20: 20 mg via ORAL
  Filled 2020-03-19: qty 2

## 2020-03-19 MED ORDER — FUROSEMIDE 20 MG PO TABS
20.0000 mg | ORAL_TABLET | Freq: Once | ORAL | Status: AC
  Administered 2020-03-20: 20 mg via ORAL
  Filled 2020-03-19: qty 1

## 2020-03-19 MED ORDER — OXYCODONE-ACETAMINOPHEN 5-325 MG PO TABS: 1.0000 | ORAL_TABLET | Freq: Four times a day (QID) | ORAL | Status: DC | PRN

## 2020-03-19 MED ORDER — FERROUS SULFATE 325 (65 FE) MG PO TABS
325.0000 mg | ORAL_TABLET | Freq: Two times a day (BID) | ORAL | Status: DC
  Administered 2020-03-20: 325 mg via ORAL
  Filled 2020-03-19: qty 1

## 2020-03-19 MED ORDER — OXYCODONE HCL 5 MG PO TABS: 5.0000 mg | ORAL_TABLET | Freq: Four times a day (QID) | ORAL | Status: DC | PRN

## 2020-03-19 MED ORDER — METFORMIN HCL 500 MG PO TABS
1000.0000 mg | ORAL_TABLET | Freq: Two times a day (BID) | ORAL | Status: DC
  Administered 2020-03-20: 1000 mg via ORAL
  Filled 2020-03-19: qty 2

## 2020-03-19 MED ORDER — PRAVASTATIN SODIUM 10 MG PO TABS
20.0000 mg | ORAL_TABLET | Freq: Every day | ORAL | Status: DC
  Filled 2020-03-19: qty 1

## 2020-03-19 NOTE — H&P (Signed)
History and Physical    [name] UMP:[id number] DOB: May 03, 1950 DOA: 03/19/2020  PCP: [name], DO (Confirm with patient/family/NH records and if not entered, this has to be entered at [facility] point of entry) Patient coming from: Home  I have personally briefly reviewed patient's old medical records in [place]  Chief Complaint: Feeling tired, shortness of breath  HPI: [name] is a 69 y.o. female with medical history significant of chronic iron deficiency anemia, GI bleed: Including varices and hemorrhoids and diverticulosis and also polyps status post EGD and colonoscopy October 4008, chronic diastolic CHF, COPD,IIDM, presented with worsening of anemia. Patient has been feeling increasing shortness of breath and severe fatigue over the last 2 weeks. She has home oxygen on a as needed basis, but last 2 days she noticed she can barely move around without oxygen. Denies any chest pain no abdominal pain no fever chills no feeling of nausea vomit diarrhea. She denied any blood or dark-colored stool. She was sent from PCP for her blood test showed hemoglobin dropped to 7.2.  ED Course: Repeat hemoglobin 6.5. Rectal exam by ED physician showed maroon-colored stool with guaiac positive.  Review of Systems: As per HPI otherwise 14 point review of systems negative.    Past Medical History:  Diagnosis Date  . Adrenal adenoma, left    Stable  . Anxiety   . Arthritis    bilateral hands  . CHF (congestive heart failure) ([place])   . COPD (chronic obstructive pulmonary disease) ([place])   . Depression   . Diabetes mellitus without complication ([place])   . Dyspnea   . Esophageal varices ([place])   . Grade II diastolic dysfunction   . History of kidney stones   . Hyperlipidemia   . Hypertension   . Lower back pain   . Panic attacks   . Pneumonia    currently taking antibiotic and prednisone for early stages of pneumonia  . Pulmonary nodules    bilateral  . Skin  cancer    face    Past Surgical History:  Procedure Laterality Date  . Breast Cystectomy  Right   . CESAREAN SECTION    . COLONOSCOPY WITH PROPOFOL N/A 01/25/2020   Dr. Abbey Chatters: Nonbleeding internal hemorrhoids, diverticulosis, 5 mm polyp removed from the ascending colon, 10 mm polyp removed from the sigmoid colon, 30 mm polyp (tubulovillous adenoma with no high-grade dysplasia) removed from the transverse colon via piecemeal status post tattoo.  Other polyps were tubular adenomas.  3 month surveillance colonoscopy recommended.  . CYSTOSCOPY/URETEROSCOPY/HOLMIUM LASER/STENT PLACEMENT Bilateral 03/01/2019   Procedure: CYSTOSCOPY/RETROGRADEURETEROSCOPY/HOLMIUM LASER/STENT PLACEMENT;  Surgeon: Ceasar Mons, MD;  Location: [facility] ORS;  Service: Urology;  Laterality: Bilateral;  ONLY NEEDS 60 MIN  . ESOPHAGOGASTRODUODENOSCOPY (EGD) WITH PROPOFOL N/A 01/25/2020   Dr. Abbey Chatters: 4 columns grade 1 esophageal varices  . POLYPECTOMY  01/25/2020   Procedure: POLYPECTOMY;  Surgeon: Eloise Harman, DO;  Location: [facility] ENDO SUITE;  Service: Endoscopy;;  . SKIN CANCER EXCISION     Face  . SPINE SURGERY    . SUBMUCOSAL TATTOO INJECTION  01/25/2020   Procedure: SUBMUCOSAL TATTOO INJECTION;  Surgeon: Eloise Harman, DO;  Location: [facility] ENDO SUITE;  Service: Endoscopy;;     reports that she quit smoking about 4 years ago. Her smoking use included cigarettes. She has a 60.00 pack-year smoking history. She quit smokeless tobacco use about 4 years ago. She reports that she does not drink alcohol and does not use  drugs.  Allergies  Allergen Reactions  . Keflex [Cephalexin] Nausea And Vomiting    Family History  Problem Relation Age of Onset  . Diabetes Father   . Heart disease Father 32       MI  . Hypertension Father   . Anemia Mother        Transfusion dependent  . COPD Sister   . Cancer Paternal Grandmother 92       Pancreatic    Prior to Admission medications   Medication Sig Start  Date End Date Taking? Authorizing Provider  aspirin 81 MG tablet Take 81 mg by mouth daily. Reported on 06/01/2015   Yes [provider]  escitalopram (LEXAPRO) 20 MG tablet Take 1 tablet (20 mg total) by mouth daily. 10/22/19  Yes Ronnie Doss M, DO  ferrous sulfate 325 (65 FE) MG tablet Take 1 tablet (325 mg total) by mouth 2 (two) times daily with a meal. 01/26/20 03/26/20 Yes Memon, Jolaine Artist, MD  lisinopril (ZESTRIL) 10 MG tablet Take 1 tablet by mouth once daily 02/13/20  Yes Gottschalk, Ashly M, DO  lovastatin (MEVACOR) 20 MG tablet Take 1 tablet (20 mg total) by mouth at bedtime. 10/30/19  Yes Ronnie Doss M, DO  metFORMIN (GLUCOPHAGE) 1000 MG tablet Take 1 tablet (1,000 mg total) by mouth 2 (two) times daily with a meal. 01/22/20  Yes [name], PA-C  oxyCODONE-acetaminophen (PERCOCET) 10-325 MG tablet Take 1 tablet by mouth every 6 (six) hours as needed for pain.   Yes [provider]  pantoprazole (PROTONIX) 40 MG tablet Take 1 tablet (40 mg total) by mouth daily. 01/26/20 03/26/20 Yes Kathie Dike, MD  potassium chloride SA (KLOR-CON) 20 MEQ tablet Take 2 tablets (40 mEq total) by mouth 2 (two) times daily. 02/18/20  Yes [name], PA-C  torsemide (DEMADEX) 20 MG tablet Take 3 tablets (60 mg total) by mouth 2 (two) times daily. 02/18/20 05/18/20 Yes [name], PA-C  Blood Glucose Calibration (GLUCOMETER DEX HIGH CONTROL) LIQD Test CBG's once daily.     [provider]  Blood Glucose Monitoring Suppl (TRUE METRIX AIR GLUCOSE METER) w/Device KIT Check BS daily Dx E11.9 10/02/19   Ronnie Doss M, DO  glucose blood (TRUE METRIX BLOOD GLUCOSE TEST) test strip Check BS daily Dx E11.9 10/02/19   Ronnie Doss M, DO  hydrOXYzine (ATARAX/VISTARIL) 10 MG tablet Take 1 tablet (10 mg total) by mouth 3 (three) times daily as needed for anxiety. Patient not taking: No sig reported 07/02/18   Ronnie Doss M, DO  levofloxacin Theodoro Clock) 0.5 %  ophthalmic solution Place 1 drop into the right eye every 6 (six) hours. For 7 days Patient not taking: No sig reported 01/26/20   Kathie Dike, MD  TRUEplus Lancets 30G MISC Check BS daily Dx E11.9 10/02/19   Ronnie Doss M, DO  albuterol (PROVENTIL HFA;VENTOLIN HFA) 108 (90 Base) MCG/ACT inhaler Inhale 2 puffs into the lungs every 6 (six) hours as needed for wheezing or shortness of breath. 09/21/16 09/22/16  Katheren Shams, DO    Physical Exam: Vitals:   03/19/20 1730 03/19/20 1800 03/19/20 1954 03/19/20 2029  BP: (!) 132/57 (!) 148/118  107/70  Pulse: 99 99  96  Resp: _0 Temp: 98.7 F (37.1 C)   99.1 F (37.3 C)  TempSrc: Oral   Oral  SpO2: 100% 100%  100%  Weight:   132.4 kg   Height:  Constitutional: NAD, calm, comfortable Vitals:   03/19/20 1730 03/19/20 1800 03/19/20 1954 03/19/20 2029  BP: (!) 132/57 (!) 148/118  107/70  Pulse: 99 99  96  Resp: _0 Temp: 98.7 F (37.1 C)   99.1 F (37.3 C)  TempSrc: Oral   Oral  SpO2: 100% 100%  100%  Weight:   132.4 kg   Height:       Eyes: PERRL, lids and conjunctivae normal ENMT: Mucous membranes are moist. Posterior pharynx clear of any exudate or lesions.Normal dentition.  Neck: normal, supple, no masses, no thyromegaly Respiratory: clear to auscultation bilaterally, no wheezing, no crackles. Normal respiratory effort. No accessory muscle use.  Cardiovascular: Regular rate and rhythm, no murmurs / rubs / gallops. No extremity edema. 2+ pedal pulses. No carotid bruits.  Abdomen: no tenderness, no masses palpated. No hepatosplenomegaly. Bowel sounds positive.  Musculoskeletal: no clubbing / cyanosis. No joint deformity upper and lower extremities. Good ROM, no contractures. Normal muscle tone.  Skin: no rashes, lesions, ulcers. No induration Neurologic: CN 2-12 grossly intact. Sensation intact, DTR normal. Strength 5/5 in all 4.  Psychiatric: Normal judgment and insight. Alert and oriented x 3. Normal  mood.    Labs on Admission: I have personally reviewed following labs and imaging studies  CBC: Recent Labs  Lab 03/19/20 1516  WBC 6.3  NEUTROABS 3.7  HGB 6.5*  HCT 23.6*  MCV 86.1  PLT 078*   Basic Metabolic Panel: Recent Labs  Lab 03/19/20 1516  NA 136  K 3.8  CL 97*  CO2 31  GLUCOSE 165*  BUN 14  CREATININE 0.73  CALCIUM 8.2*   GFR: Estimated Creatinine Clearance: 85.7 mL/min (by C-G formula based on SCr of 0.73 mg/dL). Liver Function Tests: Recent Labs  Lab 03/19/20 1516  AST 28  ALT 17  ALKPHOS 86  BILITOT 0.4  PROT 8.0  ALBUMIN 2.8*   Recent Labs  Lab 03/19/20 1516  LIPASE 45   No results for input(s): AMMONIA in the last 168 hours. Coagulation Profile: Recent Labs  Lab 03/19/20 1516  INR 1.0   Cardiac Enzymes: No results for input(s): CKTOTAL, CKMB, CKMBINDEX, TROPONINI in the last 168 hours. BNP (last 3 results) Recent Labs    05/29/19 1105  PROBNP 58.0   HbA1C: No results for input(s): HGBA1C in the last 72 hours. CBG: No results for input(s): GLUCAP in the last 168 hours. Lipid Profile: No results for input(s): CHOL, HDL, LDLCALC, TRIG, CHOLHDL, LDLDIRECT in the last 72 hours. Thyroid Function Tests: No results for input(s): TSH, T4TOTAL, FREET4, T3FREE, THYROIDAB in the last 72 hours. Anemia Panel: No results for input(s): VITAMINB12, FOLATE, FERRITIN, TIBC, IRON, RETICCTPCT in the last 72 hours. Urine analysis:    Component Value Date/Time   COLORURINE STRAW (A) 01/22/2020 1725   APPEARANCEUR Cloudy (A) 02/07/2020 0000   LABSPEC 1.005 01/22/2020 1725   PHURINE 8.0 01/22/2020 1725   GLUCOSEU Negative 02/07/2020 0000   HGBUR LARGE (A) 01/22/2020 1725   HGBUR negative 07/23/2007 1049   BILIRUBINUR Negative 02/07/2020 0000   KETONESUR NEGATIVE 01/22/2020 1725   PROTEINUR 2+ (A) 02/07/2020 0000   PROTEINUR NEGATIVE 01/22/2020 1725   UROBILINOGEN 0.2 04/30/2015 1205   UROBILINOGEN 0.2 07/23/2007 1049   NITRITE Negative  02/07/2020 0000   NITRITE NEGATIVE 01/22/2020 1725   LEUKOCYTESUR Negative 02/07/2020 0000   LEUKOCYTESUR NEGATIVE 01/22/2020 1725    Radiological Exams on Admission: No results found.  EKG: Independently reviewed. Sinus rhythm no acute  ST changes  Assessment/Plan Active Problems:   GI bleed  (please populate well all problems here in Problem List. (For example, if patient is on BP meds at home and you resume or decide to hold them, it is a problem that needs to be her. Same for CAD, COPD, HLD and so on)  Acute on chronic iron deficiency anemia secondary to GI bleed -Increase PPI to twice daily -[facility] informed and will see the patient, keep patient n.p.o. after midnight. -Transfuse PRBC x2 unit  HTN -BP borderline, hold home BP meds for tonight -As needed hydralazine for now  IIDM -Continue Metformin  COPD -No symptoms or signs of acute exacerbation  Chronic O2 dependent respiratory failure -Continue as needed O2 support  DVT prophylaxis: SCD  code Status: Full code Family Communication: None at bedside Disposition Plan: Expect more than 2 midnight hospital stay for GI work-up Consults called: [facility] Admission status: Telemetry admission   Lequita Halt MD [facility] Pager [phone]  03/19/2020, 9:01 PM

## 2020-03-19 NOTE — Chronic Care Management (AMB) (Signed)
Chronic Care Management    Clinical Social Work Follow Up Note  03/19/2020 Name: Savannah Burns MRN: [id number] DOB: [birth year]  Savannah Burns is a 69 y.o. year old female who is a primary care patient of [name], DO. The CCM team was consulted for assistance with [employer] .   Review of patient status, including review of consultants reports, other relevant assessments, and collaboration with appropriate care team members and the patient's provider was performed as part of comprehensive patient evaluation and provision of chronic care management services.    SDOH (Social Determinants of Health) assessments performed: No; risk for depression; risk for tobacco use;risk for physical inactivity; risk for stress  Flowsheet Row Chronic Care Management from 12/05/2019 in [place]  PHQ-9 Total Score 6     GAD 7 : Generalized Anxiety Score 12/05/2019 12/22/2017  Nervous, Anxious, on Edge 1 1  Control/stop worrying 1 3  Worry too much - different things 1 1  Trouble relaxing 0 1  Restless 0 0  Easily annoyed or irritable 1 1  Afraid - awful might happen 0 3  Total GAD 7 Score 4 10  Anxiety Difficulty Somewhat difficult Somewhat difficult    Outpatient Encounter Medications as of 03/19/2020  Medication Sig  . aspirin 81 MG tablet Take 81 mg by mouth daily. Reported on 06/01/2015  . Blood Glucose Calibration (GLUCOMETER DEX HIGH CONTROL) LIQD Test CBG's once daily.   . Blood Glucose Monitoring Suppl (TRUE METRIX AIR GLUCOSE METER) w/Device KIT Check BS daily Dx E11.9  . escitalopram (LEXAPRO) 20 MG tablet Take 1 tablet (20 mg total) by mouth daily.  . ferrous sulfate 325 (65 FE) MG tablet Take 1 tablet (325 mg total) by mouth 2 (two) times daily with a meal.  . glucose blood (TRUE METRIX BLOOD GLUCOSE TEST) test strip Check BS daily Dx E11.9  . hydrOXYzine (ATARAX/VISTARIL) 10 MG tablet Take 1 tablet (10 mg total) by mouth 3 (three) times  daily as needed for anxiety.  Marland Kitchen levofloxacin (QUIXIN) 0.5 % ophthalmic solution Place 1 drop into the right eye every 6 (six) hours. For 7 days  . lisinopril (ZESTRIL) 10 MG tablet Take 1 tablet by mouth once daily  . lovastatin (MEVACOR) 20 MG tablet Take 1 tablet (20 mg total) by mouth at bedtime.  . metFORMIN (GLUCOPHAGE) 1000 MG tablet Take 1 tablet (1,000 mg total) by mouth 2 (two) times daily with a meal.  . oxyCODONE-acetaminophen (PERCOCET) 10-325 MG tablet Take 1 tablet by mouth every 6 (six) hours as needed for pain.  . pantoprazole (PROTONIX) 40 MG tablet Take 1 tablet (40 mg total) by mouth daily.  . potassium chloride SA (KLOR-CON) 20 MEQ tablet Take 2 tablets (40 mEq total) by mouth 2 (two) times daily.  Marland Kitchen torsemide (DEMADEX) 20 MG tablet Take 3 tablets (60 mg total) by mouth 2 (two) times daily.  . TRUEplus Lancets 30G MISC Check BS daily Dx E11.9  . [DISCONTINUED] albuterol (PROVENTIL HFA;VENTOLIN HFA) 108 (90 Base) MCG/ACT inhaler Inhale 2 puffs into the lungs every 6 (six) hours as needed for wheezing or shortness of breath.   No facility-administered encounter medications on file as of 03/19/2020.    Goals    .  Client will talk with LCSW in next 30 days to discuss depression issues of client and manaement of depression issues (pt-stated)      CARE PLAN ENTRY   Current Barriers:  Patient with chronic diagnoses of Depression,  Obesity, DM, HTN, COPD, DJD, HLD  Clinical Social Work Clinical Goal(s):  Marland Kitchen LCSW will call client in next 30 days to discuss depression issues of client and client management of depression issues  Interventions:  Talked with Yvone Neu, daughter of client, about client needs  Talked with Caryl Pina about pain issues of client   Talked with Caryl Pina about energy level of client  Talked with Caryl Pina about client needs related to iron level.Caryl Pina said client was currently on her way to ED at [facility] related to iron level issues of  client.   LCSW thanked Caryl Pina for phone call with LCSW and encouraged client or Caryl Pina to call LCSW as needed for social work support   Patient Self Care Activities:   Eats meals with set up assistance   Patient Self Care Deficits:  Shortness of breath (breathing challenges, uses oxygen as needed) Mobility issues Transport challenges  Initial goal documentation     Follow Up Plan: LCSW to call client /daughter of client in next 4 weeks to talk with client/daughter about depression issues of client and to talk about client  management of depression issues faced  [name].Nike Southwell MSW, LCSW Licensed Clinical Social Worker [facility] Care Management [phone]

## 2020-03-19 NOTE — ED Provider Notes (Signed)
[facility] EMERGENCY DEPARTMENT Provider Note   CSN: [id number] Arrival date & time: 03/19/20  1412     History Chief Complaint  Patient presents with  . Abnormal Lab    Savannah Burns is a 69 y.o. female.  The history is provided by the patient and medical records. No language interpreter was used.  Abnormal Lab    69 year old female significant history of diabetes, hypertension, COPD chronic home O2 at 5 L, CHF, previously admitted today hospital nearly 2 months ago for anemia presenting today for complaint of low hemoglobin.  Patient states she was admitted in October for generalized weakness.  She was found to have a hemoglobin of 3.8 during that visit.  She was hospitalized for several days.  She received 6 unit of blood.  She had an EGD/colonoscopy on 10/30 that shows early varices and a very large polyp in the colon that was resected and biopsied.  She report it was not cancerous.  She report doing well, and not needing her home O2 and was able to be more active after the blood transfusion however for the past 2 weeks she feels increasingly weak, now using home O2 at 3 L, feeling fatigued with simple exertion.  She denies having fever but does endorse some chills.  Denies any runny nose sneezing or coughing did not notice any blood in the stool or any other abnormal bleeding.  No urinary symptoms.  She recently had her blood work done several days prior and was notified that her hemoglobin is 7.2.  She is here for further management.  Patient has not been vaccinated for COVID-19.  Past Medical History:  Diagnosis Date  . Adrenal adenoma, left    Stable  . Anxiety   . Arthritis    bilateral hands  . CHF (congestive heart failure) ([place])   . COPD (chronic obstructive pulmonary disease) ([place])   . Depression   . Diabetes mellitus without complication ([place])   . Dyspnea   . Grade II diastolic dysfunction   . History of kidney stones   . Hyperlipidemia   . Hypertension   .  Lower back pain   . Panic attacks   . Pneumonia    currently taking antibiotic and prednisone for early stages of pneumonia  . Pulmonary nodules    bilateral  . Skin cancer    face    Patient Active Problem List   Diagnosis Date Noted  . Acute on chronic congestive heart failure ([place])   . Symptomatic anemia   . Iron deficiency anemia   . Heme positive stool   . ABLA (acute blood loss anemia) 01/22/2020  . Supplemental oxygen dependent 10/22/2019  . Chronic dyspnea 10/22/2019  . Hypertension associated with diabetes ([place]) 10/22/2019  . Bilateral hand pain 09/21/2016  . Fatigue 07/30/2015  . Chronic diastolic HF (heart failure) ([place]) 06/29/2015  . Postmenopausal bleeding 04/17/2015  . Excessive daytime sleepiness 12/19/2014  . Swelling of lower extremity 11/27/2014  . Back pain 06/13/2013  . Sinusitis, chronic 05/30/2012  . Allergic rhinitis 06/06/2011  . Hyperlipidemia associated with type 2 diabetes mellitus ([place]) 03/24/2009  . Diabetes mellitus, type 2 ([place]) 08/12/2008  . Pulmonary nodule 08/29/2007  . COPD (chronic obstructive pulmonary disease) ([place]) 02/14/2007  . OBESITY, NOS 05/25/2006  . TOBACCO DEPENDENCE 05/25/2006  . Depression, recurrent ([place]) 05/25/2006  . HYPERTENSION, BENIGN SYSTEMIC 05/25/2006  . DJD, UNSPECIFIED 05/25/2006    Past Surgical History:  Procedure Laterality Date  . Breast Cystectomy  Right   .  CESAREAN SECTION    . COLONOSCOPY WITH PROPOFOL N/A 01/25/2020   Procedure: COLONOSCOPY WITH PROPOFOL;  Surgeon: Eloise Harman, DO;  Location: [facility] END[facility] SU[facility];  Service: Endoscopy;  Laterality: N/A;  . CYSTOSCOPY/URETEROSCOPY/HOLMIUM LASER/STENT PLACEMENT Bilateral 03/01/2019   Procedure: CYSTOSCOPY/RETROGRADEURETEROSCOPY/HOLMIUM LASER/STENT PLACEMENT;  Surgeon: Ceasar Mons, MD;  Location: [facility] ORS;  Service: Urology;  Laterality: Bilateral;  ONLY NEEDS 60 MIN  . ESOPHAGOGASTRODUODENOSCOPY (EGD) WITH PROPOFOL N/A 01/25/2020   Procedure:  ESOPHAGOGASTRODUODENOSCOPY (EGD) WITH PROPOFOL;  Surgeon: Eloise Harman, DO;  Location: [facility] END[facility];  Service: Endoscopy;  Laterality: N/A;  . POLYPECTOMY  01/25/2020   Procedure: POLYPECTOMY;  Surgeon: Eloise Harman, DO;  Location: [facility] SU[facility];  Service: Endoscopy;;  . SKIN CANCER EXCISION     Face  . SPINE SURGERY    . SUBMUCOSAL TATTOO INJECTION  01/25/2020   Procedure: SUBMUCOSAL TATTOO INJECTION;  Surgeon: Eloise Harman, DO;  Location: [facility];  Service: Endoscopy;;     OB History   No obstetric history on file.     Family History  Problem Relation Age of Onset  . Diabetes Father   . Heart disease Father 35       MI  . Hypertension Father   . Anemia Mother        Transfusion dependent  . COPD Sister   . Cancer Paternal Grandmother 6       Pancreatic    Social History   Tobacco Use  . Smoking status: Former Smoker    Packs/day: 1.50    Years: 40.00    Pack years: 60.00    Types: Cigarettes    Quit date: 04/29/2015    Years since quitting: 4.8  . Smokeless tobacco: Former Systems developer    Quit date: 04/29/2015  . Tobacco comment: Quit smoking 04/2015- Previous 1.5 ppd smoker  Vaping Use  . Vaping Use: Never used  Substance Use Topics  . Alcohol use: No    Alcohol/week: 0.0 standard drinks  . Drug use: No    Home Medications Prior to Admission medications   Medication Sig Start Date End Date Taking? Authorizing Provider  aspirin 81 MG tablet Take 81 mg by mouth daily. Reported on 06/01/2015    [provider]  Blood Glucose Calibration (GLUCOMETER DEX HIGH CONTROL) LIQD Test CBG's once daily.     [provider]  Blood Glucose Monitoring Suppl (TRUE METRIX AIR GLUCOSE METER) w/Device KIT Check BS daily Dx E11.9 10/02/19   Ronnie Doss M, DO  escitalopram (LEXAPRO) 20 MG tablet Take 1 tablet (20 mg total) by mouth daily. 10/22/19   [name], DO  ferrous sulfate 325 (65 FE) MG tablet Take 1 tablet (325 mg total) by mouth 2  (two) times daily with a meal. 01/26/20 03/26/20  Kathie Dike, MD  glucose blood (TRUE METRIX BLOOD GLUCOSE TEST) test strip Check BS daily Dx E11.9 10/02/19   Ronnie Doss M, DO  hydrOXYzine (ATARAX/VISTARIL) 10 MG tablet Take 1 tablet (10 mg total) by mouth 3 (three) times daily as needed for anxiety. 07/02/18   [name], DO  levofloxacin Theodoro Clock) 0.5 % ophthalmic solution Place 1 drop into the right eye every 6 (six) hours. For 7 days 01/26/20   Kathie Dike, MD  lisinopril (ZESTRIL) 10 MG tablet Take 1 tablet by mouth once daily 02/13/20   Ronnie Doss M, DO  lovastatin (MEVACOR) 20 MG tablet Take 1 tablet (20 mg total) by mouth at bedtime. 10/30/19  Ronnie Doss M, DO  metFORMIN (GLUCOPHAGE) 1000 MG tablet Take 1 tablet (1,000 mg total) by mouth 2 (two) times daily with a meal. 01/22/20   [name], PA-C  oxyCODONE-acetaminophen (PERCOCET) 10-325 MG tablet Take 1 tablet by mouth every 6 (six) hours as needed for pain.    [provider]  pantoprazole (PROTONIX) 40 MG tablet Take 1 tablet (40 mg total) by mouth daily. 01/26/20 03/26/20  Kathie Dike, MD  potassium chloride SA (KLOR-CON) 20 MEQ tablet Take 2 tablets (40 mEq total) by mouth 2 (two) times daily. 02/18/20   [name], PA-C  torsemide (DEMADEX) 20 MG tablet Take 3 tablets (60 mg total) by mouth 2 (two) times daily. 02/18/20 05/18/20  [name], PA-C  TRUEplus Lancets 30G MISC Check BS daily Dx E11.9 10/02/19   Ronnie Doss M, DO  albuterol (PROVENTIL HFA;VENTOLIN HFA) 108 (90 Base) MCG/ACT inhaler Inhale 2 puffs into the lungs every 6 (six) hours as needed for wheezing or shortness of breath. 09/21/16 09/22/16  Katheren Shams, DO    Allergies    Keflex [cephalexin]  Review of Systems   Review of Systems  All other systems reviewed and are negative.   Physical Exam Updated Vital Signs BP (!) 118/54   Pulse 93   Temp 98.6 F (37 C) (Oral)   Resp 20   Ht 5' 2"   (1.575 m)   Wt 136.1 kg   SpO2 100%   BMI 54.87 kg/m   Physical Exam Vitals and nursing note reviewed. Exam conducted with a chaperone present.  Constitutional:      General: She is not in acute distress.    Appearance: She is well-developed and well-nourished. She is obese.     Comments: Wearing supplemental oxygen, appears to be in no acute discomfort.  HENT:     Head: Atraumatic.  Eyes:     Conjunctiva/sclera: Conjunctivae normal.  Cardiovascular:     Rate and Rhythm: Normal rate and regular rhythm.     Heart sounds: Murmur heard.    Pulmonary:     Effort: Pulmonary effort is normal.     Breath sounds: Normal breath sounds. No wheezing, rhonchi or rales.  Abdominal:     Palpations: Abdomen is soft.     Tenderness: There is no abdominal tenderness.  Genitourinary:    Comments: Chaperone was available during exam.  Rectal tone, no obvious mass, brownish-reddish loose stools noted without gross blood however Hemoccult positive. Musculoskeletal:     Cervical back: Neck supple.  Skin:    Coloration: Skin is pale.     Findings: No rash.  Neurological:     Mental Status: She is alert and oriented to person, place, and time.  Psychiatric:        Mood and Affect: Mood and affect and mood normal.     ED Results / Procedures / Treatments   Labs (all labs ordered are listed, but only abnormal results are displayed) Labs Reviewed  CBC WITH DIFFERENTIAL/PLATELET - Abnormal; Notable for the following components:      Result Value   RBC 2.74 (*)    Hemoglobin 6.5 (*)    HCT 23.6 (*)    MCH 23.7 (*)    MCHC 27.5 (*)    RDW 19.3 (*)    Platelets 145 (*)    All other components within normal limits  COMPREHENSIVE METABOLIC PANEL - Abnormal; Notable for the following components:   Chloride 97 (*)    Glucose,  Bld 165 (*)    Calcium 8.2 (*)    Albumin 2.8 (*)    All other components within normal limits  RESP PANEL BY RT-PCR (FLU A&B, COVID) ARPGX2  LIPASE, BLOOD   PROTIME-INR  URINALYSIS, ROUTINE W REFLEX MICROSCOPIC  POC OCCULT BLOOD, ED  TYPE AND SCREEN  PREPARE RBC (CROSSMATCH)    EKG EKG Interpretation  Date/Time:  Thursday March 19 2020 14:44:49 EST Ventricular Rate:  102 PR Interval:    QRS Duration: 96 QT Interval:  383 QTC Calculation: 502 R Axis:   53 Text Interpretation: Sinus tachycardia Abnormal R-wave progression, early transition Probable inferior infarct, age indeterminate Lateral leads are also involved Prolonged QT interval Interpretation limited secondary to artifact wandering baseline needs repeat Confirmed by Ezequiel Essex [phone]) on 03/19/2020 2:50:47 PM   Radiology No results found.  Procedures .Critical Care Performed by: Domenic Moras, PA-C Authorized by: Domenic Moras, PA-C   Critical care provider statement:    Critical care time (minutes):  35   Critical care was time spent personally by me on the following activities:  Discussions with consultants, evaluation of patient's response to treatment, examination of patient, ordering and performing treatments and interventions, ordering and review of laboratory studies, ordering and review of radiographic studies, pulse oximetry, re-evaluation of patient's condition, obtaining history from patient or surrogate and review of old charts   (including critical care time)  Medications Ordered in ED Medications  0.9 %  sodium chloride infusion (has no administration in time range)    ED Course  I have reviewed the triage vital signs and the nursing notes.  Pertinent labs & imaging results that were available during my care of the patient were reviewed by me and considered in my medical decision making (see chart for details).    MDM Rules/Calculators/A&P                          BP (!) 118/54   Pulse 93   Temp 98.6 F (37 C) (Oral)   Resp 20   Ht 5' 2"  (1.575 m)   Wt 136.1 kg   SpO2 100%   BMI 54.87 kg/m   Final Clinical Impression(s) / ED  Diagnoses Final diagnoses:  Symptomatic anemia  Lower GI bleed    Rx / DC Orders ED Discharge Orders    None     2:53 PM Patient with history of symptomatic anemia admitted admitted to the hospital 2 months ago presenting with generalized weakness and a recent hemoglobin of 7.2 that was performed several days prior.  On exam she does have positive Hemoccult stool with maroon-colored stool suggestive of lower GI bleed.  She is amenable for blood transfusion.  4:15 PM Hemoglobin today 6.5.  Will transfuse 2 unit of blood.  I will reach out to on-call GI specialist to have them involved in patient care and will consult for admission.  Care discussed with Dr. Wyvonnia Dusky.   4:26 PM I have consulted oncall IG specialist Dr. Gala Romney who agrees to be involve in pt care.  Will consult medicine for admission.   4:45 PM Appreciate consultation from [facility] Dr. Roosevelt Locks who agrees to see and will admit pt for further care.  Screening COVID test ordered.    Domenic Moras, PA-C 03/19/20 1646    Ezequiel Essex, MD 03/19/20 1810

## 2020-03-19 NOTE — Consult Note (Signed)
Referring Provider: Lequita Halt, MD Primary Care Physician:  [name], DO Primary Gastroenterologist:  Garfield Cornea, MD  Reason for Consultation:  Anemia, heme + stool  HPI: Savannah Burns is a 69 y.o. female with history of diabetes, hypertension, oxygen dependent COPD, chronic diastolic CHF, mild aortic stenosis with normal LVEF of 70 to 75% with grade 1 diastolic dysfunction on echo in February 2021, anemia presenting to the ED with increasing weakness, shortness of breath.  Outpatient hemoglobin dropped to 7.2 from 8.1 last month.  Patient was admitted to the hospital back in October with hemoglobin of 3.8.  She received 5 or 6 units of blood.  She completed an EGD that showed grade 1 esophageal varices (4 columns).  Colonoscopy showed nonbleeding internal hemorrhoids, diverticulosis, 30 mm tubulovillous adenoma removed piecemeal from transverse colon, site tattooed.  2 smaller tubular adenomas removed from the ascending and sigmoid colon.  15-monthsurveillance colonoscopy needed.  Diagnosed with IDA last admission.  B12 was low normal at 180.  Right upper quadrant ultrasound performed during last admission, limited exam due to body habitus.  Diffusely increased parenchymal echogenicity of the liver noted. No known prior cirrhosis but at significant risk.  In the ED her hemoglobin was 6.5.  2 days ago was 7.2 via finger prick.  Hemoglobin 8.1 on November 12.  Platelets 145,000.  BUN 14, creatinine 0.73, albumin 2.8.  INR 1.0. Vital signs stable. On digital rectal exam she had brownish-reddish loose stools without gross blood, heme positive per ED provider.  Patient denies melena or overt GI bleeding. No abdominal pain. No upper GI symptoms.  Had noted increased oxygen needs at home with ambulation.  Fatigue.  Denies chest pain.  No other signs of bleeding.  Bowel movements typically a couple of day, pasty and hard to clean up after.  Reports recurrent edema in the lower  extremities worsened since discharge last month.     Prior to Admission medications   Medication Sig Start Date End Date Taking? Authorizing Provider  aspirin 81 MG tablet Take 81 mg by mouth daily. Reported on 06/01/2015   Yes [provider]  escitalopram (LEXAPRO) 20 MG tablet Take 1 tablet (20 mg total) by mouth daily. 10/22/19  Yes GRonnie DossM, DO  ferrous sulfate 325 (65 FE) MG tablet Take 1 tablet (325 mg total) by mouth 2 (two) times daily with a meal. 01/26/20 03/26/20 Yes Memon, JJolaine Artist MD  lisinopril (ZESTRIL) 10 MG tablet Take 1 tablet by mouth once daily 02/13/20  Yes Gottschalk, Ashly M, DO  lovastatin (MEVACOR) 20 MG tablet Take 1 tablet (20 mg total) by mouth at bedtime. 10/30/19  Yes GRonnie DossM, DO  metFORMIN (GLUCOPHAGE) 1000 MG tablet Take 1 tablet (1,000 mg total) by mouth 2 (two) times daily with a meal. 01/22/20  Yes LImogene Burn PA-C  oxyCODONE-acetaminophen (PERCOCET) 10-325 MG tablet Take 1 tablet by mouth every 6 (six) hours as needed for pain.   Yes [provider]  pantoprazole (PROTONIX) 40 MG tablet Take 1 tablet (40 mg total) by mouth daily. 01/26/20 03/26/20 Yes MKathie Dike MD  potassium chloride SA (KLOR-CON) 20 MEQ tablet Take 2 tablets (40 mEq total) by mouth 2 (two) times daily. 02/18/20  Yes LImogene Burn PA-C  torsemide (DEMADEX) 20 MG tablet Take 3 tablets (60 mg total) by mouth 2 (two) times daily. 02/18/20 05/18/20 Yes LImogene Burn PA-C  Blood Glucose Calibration (GLUCOMETER DEX HIGH CONTROL) LIQD Test CBG's once  daily.     [provider]  Blood Glucose Monitoring Suppl (TRUE METRIX AIR GLUCOSE METER) w/Device KIT Check BS daily Dx E11.9 10/02/19   Ronnie Doss M, DO  glucose blood (TRUE METRIX BLOOD GLUCOSE TEST) test strip Check BS daily Dx E11.9 10/02/19   Ronnie Doss M, DO  hydrOXYzine (ATARAX/VISTARIL) 10 MG tablet Take 1 tablet (10 mg total) by mouth 3 (three) times daily as needed for  anxiety. Patient not taking: No sig reported 07/02/18   Ronnie Doss M, DO  levofloxacin Theodoro Clock) 0.5 % ophthalmic solution Place 1 drop into the right eye every 6 (six) hours. For 7 days Patient not taking: No sig reported 01/26/20   Kathie Dike, MD  TRUEplus Lancets 30G MISC Check BS daily Dx E11.9 10/02/19   Ronnie Doss M, DO  albuterol (PROVENTIL HFA;VENTOLIN HFA) 108 (90 Base) MCG/ACT inhaler Inhale 2 puffs into the lungs every 6 (six) hours as needed for wheezing or shortness of breath. 09/21/16 09/22/16  Katheren Shams, DO    Current Facility-Administered Medications  Medication Dose Route Frequency Provider Last Rate Last Admin  . 0.9 %  sodium chloride infusion  10 mL/hr Intravenous Once Domenic Moras, PA-C       Current Outpatient Medications  Medication Sig Dispense Refill  . aspirin 81 MG tablet Take 81 mg by mouth daily. Reported on 06/01/2015    . escitalopram (LEXAPRO) 20 MG tablet Take 1 tablet (20 mg total) by mouth daily. 90 tablet 0  . ferrous sulfate 325 (65 FE) MG tablet Take 1 tablet (325 mg total) by mouth 2 (two) times daily with a meal. 60 tablet 1  . lisinopril (ZESTRIL) 10 MG tablet Take 1 tablet by mouth once daily 90 tablet 0  . lovastatin (MEVACOR) 20 MG tablet Take 1 tablet (20 mg total) by mouth at bedtime. 90 tablet 3  . metFORMIN (GLUCOPHAGE) 1000 MG tablet Take 1 tablet (1,000 mg total) by mouth 2 (two) times daily with a meal. 180 tablet 3  . oxyCODONE-acetaminophen (PERCOCET) 10-325 MG tablet Take 1 tablet by mouth every 6 (six) hours as needed for pain.    . pantoprazole (PROTONIX) 40 MG tablet Take 1 tablet (40 mg total) by mouth daily. 30 tablet 1  . potassium chloride SA (KLOR-CON) 20 MEQ tablet Take 2 tablets (40 mEq total) by mouth 2 (two) times daily. 360 tablet 3  . torsemide (DEMADEX) 20 MG tablet Take 3 tablets (60 mg total) by mouth 2 (two) times daily. 180 tablet 11  . Blood Glucose Calibration (GLUCOMETER DEX HIGH CONTROL) LIQD Test  CBG's once daily.     . Blood Glucose Monitoring Suppl (TRUE METRIX AIR GLUCOSE METER) w/Device KIT Check BS daily Dx E11.9 1 kit 0  . glucose blood (TRUE METRIX BLOOD GLUCOSE TEST) test strip Check BS daily Dx E11.9 100 each 3  . hydrOXYzine (ATARAX/VISTARIL) 10 MG tablet Take 1 tablet (10 mg total) by mouth 3 (three) times daily as needed for anxiety. (Patient not taking: No sig reported) 60 tablet 2  . levofloxacin (QUIXIN) 0.5 % ophthalmic solution Place 1 drop into the right eye every 6 (six) hours. For 7 days (Patient not taking: No sig reported) 5 mL 0  . TRUEplus Lancets 30G MISC Check BS daily Dx E11.9 100 each 3    Allergies as of 03/19/2020 - Review Complete 03/19/2020  Allergen Reaction Noted  . Keflex [cephalexin] Nausea And Vomiting 04/17/2019    Past Medical History:  Diagnosis Date  .  Adrenal adenoma, left    Stable  . Anxiety   . Arthritis    bilateral hands  . CHF (congestive heart failure) ([place])   . COPD (chronic obstructive pulmonary disease) ([place])   . Depression   . Diabetes mellitus without complication ([place])   . Dyspnea   . Esophageal varices ([place])   . Grade II diastolic dysfunction   . History of kidney stones   . Hyperlipidemia   . Hypertension   . Lower back pain   . Panic attacks   . Pneumonia    currently taking antibiotic and prednisone for early stages of pneumonia  . Pulmonary nodules    bilateral  . Skin cancer    face    Past Surgical History:  Procedure Laterality Date  . Breast Cystectomy  Right   . CESAREAN SECTION    . COLONOSCOPY WITH PROPOFOL N/A 01/25/2020   Dr. Abbey Chatters: Nonbleeding internal hemorrhoids, diverticulosis, 5 mm polyp removed from the ascending colon, 10 mm polyp removed from the sigmoid colon, 30 mm polyp (tubulovillous adenoma with no high-grade dysplasia) removed from the transverse colon via piecemeal status post tattoo.  Other polyps were tubular adenomas.  3 month surveillance colonoscopy recommended.  .  CYSTOSCOPY/URETEROSCOPY/HOLMIUM LASER/STENT PLACEMENT Bilateral 03/01/2019   Procedure: CYSTOSCOPY/RETROGRADEURETEROSCOPY/HOLMIUM LASER/STENT PLACEMENT;  Surgeon: Ceasar Mons, MD;  Location: [facility];  Service: Urology;  Laterality: Bilateral;  ONLY NEEDS 60 MIN  . ESOPHAGOGASTRODUODENOSCOPY (EGD) WITH PROPOFOL N/A 01/25/2020   Dr. Abbey Chatters: 4 columns grade 1 esophageal varices  . POLYPECTOMY  01/25/2020   Procedure: POLYPECTOMY;  Surgeon: Eloise Harman, DO;  Location: [facility];  Service: Endoscopy;;  . SKIN CANCER EXCISION     Face  . SPINE SURGERY    . SUBMUCOSAL TATTOO INJECTION  01/25/2020   Procedure: SUBMUCOSAL TATTOO INJECTION;  Surgeon: Eloise Harman, DO;  Location: [facility];  Service: Endoscopy;;    Family History  Problem Relation Age of Onset  . Diabetes Father   . Heart disease Father 59       MI  . Hypertension Father   . Anemia Mother        Transfusion dependent  . COPD Sister   . Cancer Paternal Grandmother 56       Pancreatic    Social History   Socioeconomic History  . Marital status: Widowed    Spouse name: Not on file  . Number of children: 2  . Years of education: 41  . Highest education level: Not on file  Occupational History  . Not on file  Tobacco Use  . Smoking status: Former Smoker    Packs/day: 1.50    Years: 40.00    Pack years: 60.00    Types: Cigarettes    Quit date: 04/29/2015    Years since quitting: 4.8  . Smokeless tobacco: Former Systems developer    Quit date: 04/29/2015  . Tobacco comment: Quit smoking 04/2015- Previous 1.5 ppd smoker  Vaping Use  . Vaping Use: Never used  Substance and Sexual Activity  . Alcohol use: No    Alcohol/week: 0.0 standard drinks  . Drug use: No  . Sexual activity: Not Currently    Birth control/protection: Post-menopausal  Other Topics Concern  . Not on file  Social History Narrative  . Not on file   Social Determinants of Health   Financial Resource Strain: Low Risk   .  Difficulty of Paying Living Expenses: Not very hard  Food Insecurity: Not on file  Transportation Needs: No Transportation Needs  . Lack of Transportation (Medical): No  . Lack of Transportation (Non-Medical): No  Physical Activity: Inactive  . Days of Exercise per Week: 0 days  . Minutes of Exercise per Session: 0 min  Stress: Not on file  Social Connections: Not on file  Intimate Partner Violence: Not on file     ROS:  General: Negative for anorexia, weight loss, fever, chills, fatigue, weakness. Eyes: Negative for vision changes.  ENT: Negative for hoarseness, difficulty swallowing , nasal congestion. CV: Negative for chest pain, angina, palpitations, positive dyspnea on exertion, positive peripheral edema.  Respiratory: Negative for dyspnea at rest, positive dyspnea on exertion, cough, sputum, wheezing.  GI: See history of present illness. GU:  Negative for dysuria, hematuria, urinary incontinence, urinary frequency, nocturnal urination.  MS: Negative for joint pain, low back pain.  Derm: Negative for rash or itching.  Neuro: Negative for weakness, abnormal sensation, seizure, frequent headaches, memory loss, confusion.  Psych: Negative for anxiety, depression, suicidal ideation, hallucinations.  Endo: Negative for unusual weight change.  Heme: Negative for bruising or bleeding. Allergy: Negative for rash or hives.       Physical Examination: Vital signs in last 24 hours: Temp:  [98.6 F (37 C)] 98.6 F (37 C) (12/23 1420) Pulse Rate:  [93-97] 93 (12/23 1600) Resp:  [18-22] 20 (12/23 1600) BP: (117-145)/(54-79) 118/54 (12/23 1600) SpO2:  [97 %-100 %] 100 % (12/23 1600) Weight:  [136.1 kg] 136.1 kg (12/23 1421)    General: Well-nourished, well-developed in no acute distress.  Head: Normocephalic, atraumatic.   Eyes: Conjunctiva pale, no icterus. Mouth: Oropharyngeal mucosa moist and pink , no lesions erythema or exudate. Neck: Supple without thyromegaly, masses, or  lymphadenopathy.  Lungs: Clear to auscultation bilaterally.  Heart: Regular rate and rhythm, no murmurs rubs or gallops.  Abdomen: Bowel sounds are normal, nontender, nondistended, no hepatosplenomegaly or masses, no abdominal bruits or hernia, no rebound or guarding.  Limited by body habitus Rectal:  Performed by ED provider as outlined above. Extremities: No lower extremity edema, clubbing, deformity.  Neuro: Alert and oriented x 4 , grossly normal neurologically.  Skin: Warm and dry, no rash or jaundice.   Psych: Alert and cooperative, normal mood and affect.        Intake/Output from previous day: No intake/output data recorded. Intake/Output this shift: No intake/output data recorded.  Lab Results: CBC Recent Labs    03/19/20 1516  WBC 6.3  HGB 6.5*  HCT 23.6*  MCV 86.1  PLT 145*   BMET Recent Labs    03/19/20 1516  NA 136  K 3.8  CL 97*  CO2 31  GLUCOSE 165*  BUN 14  CREATININE 0.73  CALCIUM 8.2*   LFT Recent Labs    03/19/20 1516  BILITOT 0.4  ALKPHOS 86  AST 28  ALT 17  PROT 8.0  ALBUMIN 2.8*    Lipase Recent Labs    03/19/20 1516  LIPASE 45    PT/INR Recent Labs    03/19/20 1516  LABPROT 12.6  INR 1.0   Lab Results  Component Value Date   IRON 14 (L) 01/22/2020   TIBC 473 (H) 01/22/2020   FERRITIN 4 (L) 01/22/2020   Lab Results  Component Value Date   VITAMINB12 180 01/22/2020   Lab Results  Component Value Date   FOLATE 8.3 01/22/2020       Imaging Studies: No results found.Minnie.[place] week]   Impression: Pleasant 69 year old female with multiple comorbidities  as outlined above including profound anemia in October requiring blood transfusion/admission, work-up revealed grade 1 esophageal varices, large tubulovillous adenoma removed piecemeal from the transverse colon.  Suspected cirrhosis although right upper quadrant ultrasound with limited information given body habitus.  Presented to the ED for declining hemoglobin,  symptomatic anemia.  No overt GI bleeding noted.  Heme positive stool on exam.  Patient with evidence of iron deficiency anemia and low normal B12.  Suspect ongoing occult GI bleeding, possibly from the colon.  She will need short interval surveillance colonoscopy due to large tubulovillous adenoma which was removed piecemeal, to make sure no residual tissue.  Unlikely upper GI bleed given recent EGD findings.  Cannot rule out occult GI bleeding from a small bowel source.  Patient is stable from a GI standpoint.  She would like to receive blood transfusion with hopes to be discharged tomorrow.  She prefers outpatient GI work-up which is reasonable in the setting, unless she displays overt GI bleeding overnight.  Plan: 1. Recommend 2 units of packed red blood cells. 2. Continue PPI. 3. Plan on colonoscopy as an outpatient.  If unremarkable she will need a G[facility]ens small bowel capsule endoscopy as next step.  We will make arrangements. 4. Discussed with patient, would consider CT imaging of the abdomen as an outpatient to further assess for cirrhosis.  Ultrasound limited due to body habitus. 5. Recommend oral B12, low normal values last month.  We would like to thank you for the opportunity to participate in the care of Savannah Burns.    LOS: 0 days

## 2020-03-19 NOTE — Patient Instructions (Addendum)
Licensed Clinical Social Worker Visit Information  Goals we discussed today:   .  Client will talk with LCSW in next 30 days to discuss depression issues of client and manaement of depression issues (pt-stated)        CARE PLAN ENTRY   Current Barriers:  Patient with chronic diagnoses of Depression, Obesity, DM, HTN, COPD, DJD, HLD  Clinical Social Work Clinical Goal(s):   LCSW will call client in next 30 days to discuss depression issues of client and client management of depression issues  Interventions:  Talked with Yvone Neu, daughter of client, about client needs  Talked withAshleyabout pain issues of client   Talked with Caryl Pina about energy level of client  Talked with Caryl Pina about client needs related to iron level.Caryl Pina said client was currently on her way to ED at [facility] related to iron level issues of client.   LCSW thanked Caryl Pina for phone call with LCSW and encouraged client or Caryl Pina to call LCSW as needed for social work support   Patient Self Care Activities:   Eats meals with set up assistance   Patient Self Care Deficits:  Shortness of breath (breathing challenges, uses oxygen as needed) Mobility issues Transport challenges  Initial goal documentation     Follow Up Plan: LCSW to call client/daughter of clientin next 4 weeks to talk withclient/daughterabout depression issues of client and to talkabout clientmanagement of depression issues faced  Materials Provided: No  The patient/Ashley Krieger, daughter of patient, verbalized understanding of instructions provided today and declined a print copy of patient instruction materials.   [name].Tanish Prien MSW, LCSW Licensed Clinical Social Worker [facility] [phone]

## 2020-03-19 NOTE — ED Triage Notes (Addendum)
Pt to ED for evaluation for anemia. Pt has an outside result with Hbg of 7.2.  Pt uses 3 L O2 at home when ambulating. Pt's O2 is off at this time with oxygen saturation of 97 at rest.

## 2020-03-19 NOTE — ED Notes (Signed)
CRITICAL VALUE ALERT  Critical Value:  Hemoglobin 6.5  Date & Time Notied:  03/19/2020 @ 3567  Provider Notified: Dr Roderic Palau  Orders Received/Actions taken: see new orders.

## 2020-03-20 DIAGNOSIS — K922 Gastrointestinal hemorrhage, unspecified: Secondary | ICD-10-CM | POA: Diagnosis present

## 2020-03-20 DIAGNOSIS — D649 Anemia, unspecified: Secondary | ICD-10-CM | POA: Diagnosis not present

## 2020-03-20 LAB — CBC WITH DIFFERENTIAL/PLATELET
Abs Immature Granulocytes: 0.03 10*3/uL (ref 0.00–0.07)
Basophils Absolute: 0 10*3/uL (ref 0.0–0.1)
Basophils Relative: 0 %
Eosinophils Absolute: 0.1 10*3/uL (ref 0.0–0.5)
Eosinophils Relative: 2 %
HCT: 29.2 % — ABNORMAL LOW (ref 36.0–46.0)
Hemoglobin: 8.6 g/dL — ABNORMAL LOW (ref 12.0–15.0)
Immature Granulocytes: 1 %
Lymphocytes Relative: 25 %
Lymphs Abs: 1.6 10*3/uL (ref 0.7–4.0)
MCH: 25.3 pg — ABNORMAL LOW (ref 26.0–34.0)
MCHC: 29.5 g/dL — ABNORMAL LOW (ref 30.0–36.0)
MCV: 85.9 fL (ref 80.0–100.0)
Monocytes Absolute: 0.7 10*3/uL (ref 0.1–1.0)
Monocytes Relative: 10 %
Neutro Abs: 4 10*3/uL (ref 1.7–7.7)
Neutrophils Relative %: 62 %
Platelets: 132 10*3/uL — ABNORMAL LOW (ref 150–400)
RBC: 3.4 MIL/uL — ABNORMAL LOW (ref 3.87–5.11)
RDW: 17.7 % — ABNORMAL HIGH (ref 11.5–15.5)
WBC: 6.4 10*3/uL (ref 4.0–10.5)
nRBC: 0 % (ref 0.0–0.2)

## 2020-03-20 LAB — HEMOGLOBIN AND HEMATOCRIT, BLOOD
HCT: 27.2 % — ABNORMAL LOW (ref 36.0–46.0)
Hemoglobin: 8.1 g/dL — ABNORMAL LOW (ref 12.0–15.0)

## 2020-03-20 MED ORDER — PANTOPRAZOLE SODIUM 40 MG PO TBEC: 40.0000 mg | DELAYED_RELEASE_TABLET | Freq: Two times a day (BID) | ORAL | 2 refills | Status: DC

## 2020-03-20 MED ORDER — CYANOCOBALAMIN 1000 MCG/ML IJ SOLN
1000.0000 ug | Freq: Once | INTRAMUSCULAR | Status: AC
  Administered 2020-03-20: 1000 ug via INTRAMUSCULAR
  Filled 2020-03-20: qty 1

## 2020-03-20 NOTE — Progress Notes (Signed)
Pt has discharge orders. Pt tolerated full liquid diet well with no c/o nausea/vomiting. Denies any discomfort. Discharge instructions given and no further questions at this time.

## 2020-03-20 NOTE — Care Management Obs Status (Signed)
[place] NOTIFICATION   Patient Details  Name: Savannah Burns MRN: [id number] Date of Birth: 02/19/1951   Medicare Observation Status Notification Given:  Yes    Sherie Don, LCSW 03/20/2020, 9:17 AM

## 2020-03-20 NOTE — Discharge Instructions (Signed)
1)Avoid ibuprofen/Advil/Aleve/Motrin/Goody Powders/Naproxen/BC powders/Meloxicam/Diclofenac/Indomethacin and other Nonsteroidal anti-inflammatory medications as these will make you more likely to bleed and can cause stomach ulcers, can also cause Kidney problems.   2)follow up with gastroenterologist within a week to schedule outpatient EGD and colonoscopy due to recurrent bleeding requiring transfusion  3)Repeat  CBC and BMP test within a week advised

## 2020-03-20 NOTE — Care Management CC44 (Signed)
Condition Code 44 Documentation Completed  Patient Details  Name: Savannah Burns MRN: [id number] Date of Birth: Sep 27, 1950   Condition Code 44 given:  Yes Patient signature on Condition Code 44 notice:  Yes Documentation of 2 MD's agreement:  Yes Code 44 added to claim:  Yes    Sherie Don, LCSW 03/20/2020, 9:17 AM

## 2020-03-20 NOTE — Discharge Summary (Signed)
Savannah Burns, is a 69 y.o. female  DOB 1950-12-19  MRN [id number].  Admission date:  03/19/2020  Admitting Physician  Lequita Halt, MD  Discharge Date:  03/20/2020   Primary MD  [name], DO  Recommendations for primary care physician for things to follow:   1)Avoid ibuprofen/Advil/Aleve/Motrin/Goody Powders/Naproxen/BC powders/Meloxicam/Diclofenac/Indomethacin and other Nonsteroidal anti-inflammatory medications as these will make you more likely to bleed and can cause stomach ulcers, can also cause Kidney problems.   2)follow up with gastroenterologist within a week to schedule outpatient EGD and colonoscopy due to recurrent bleeding requiring transfusion  3)Repeat  CBC and BMP test within a week advised  Admission Diagnosis  GI bleed [K92.2] Lower GI bleed [K92.2] Symptomatic anemia [D64.9]   Discharge Diagnosis  GI bleed [K92.2] Lower GI bleed [K92.2] Symptomatic anemia [D64.9]    Active Problems:   Lower GI bleed   GI bleed      Past Medical History:  Diagnosis Date  . Adrenal adenoma, left    Stable  . Anxiety   . Arthritis    bilateral hands  . CHF (congestive heart failure) ([place])   . COPD (chronic obstructive pulmonary disease) ([place])   . Depression   . Diabetes mellitus without complication ([place])   . Dyspnea   . Esophageal varices ([place])   . Grade II diastolic dysfunction   . History of kidney stones   . Hyperlipidemia   . Hypertension   . Lower back pain   . Panic attacks   . Pneumonia    currently taking antibiotic and prednisone for early stages of pneumonia  . Pulmonary nodules    bilateral  . Skin cancer    face    Past Surgical History:  Procedure Laterality Date  . Breast Cystectomy  Right   . CESAREAN SECTION    . COLONOSCOPY WITH PROPOFOL N/A 01/25/2020   Dr. Abbey Chatters: Nonbleeding internal hemorrhoids, diverticulosis, 5 mm polyp removed from  the ascending colon, 10 mm polyp removed from the sigmoid colon, 30 mm polyp (tubulovillous adenoma with no high-grade dysplasia) removed from the transverse colon via piecemeal status post tattoo.  Other polyps were tubular adenomas.  3 month surveillance colonoscopy recommended.  . CYSTOSCOPY/URETEROSCOPY/HOLMIUM LASER/STENT PLACEMENT Bilateral 03/01/2019   Procedure: CYSTOSCOPY/RETROGRADEURETEROSCOPY/HOLMIUM LASER/STENT PLACEMENT;  Surgeon: Ceasar Mons, MD;  Location: [facility];  Service: Urology;  Laterality: Bilateral;  ONLY NEEDS 60 MIN  . ESOPHAGOGASTRODUODENOSCOPY (EGD) WITH PROPOFOL N/A 01/25/2020   Dr. Abbey Chatters: 4 columns grade 1 esophageal varices  . POLYPECTOMY  01/25/2020   Procedure: POLYPECTOMY;  Surgeon: Eloise Harman, DO;  Location: [facility];  Service: Endoscopy;;  . SKIN CANCER EXCISION     Face  . SPINE SURGERY    . SUBMUCOSAL TATTOO INJECTION  01/25/2020   Procedure: SUBMUCOSAL TATTOO INJECTION;  Surgeon: Eloise Harman, DO;  Location: [facility];  Service: Endoscopy;;      HPI  from the history and physical done on the day of admission:    Chief  Complaint: Feeling tired, shortness of breath  HPI: [name] is a 69 y.o. female with medical history significant of chronic iron deficiency anemia, GI bleed: Including varices and hemorrhoids and diverticulosis and also polyps status post EGD and colonoscopy October 0786, chronic diastolic CHF, COPD,IIDM, presented with worsening of anemia. Patient has been feeling increasing shortness of breath and severe fatigue over the last 2 weeks. She has home oxygen on a as needed basis, but last 2 days she noticed she can barely move around without oxygen. Denies any chest pain no abdominal pain no fever chills no feeling of nausea vomit diarrhea. She denied any blood or dark-colored stool. She was sent from PCP for her blood test showed hemoglobin dropped to 7.2.  ED Course: Repeat hemoglobin 6.5. Rectal  exam by ED physician showed maroon-colored stool with guaiac positive.  Review of Systems: As per HPI otherwise 14 point review of systems negative.      Hospital Course:     Please see full H&P from Dr. Roosevelt Locks for details of patient's presenting symptom  A/p 1) acute on chronic iron deficiency anemia secondary to GI losses -GI consult appreciated -Plan is for outpatient endoluminal evaluation -Hemoglobin is up to 8.1 from 6.5 after transfusion -Patient refuses further transfusion or intervention at this time she wants to go home for Christmas and follow-up with GI as outpatient  2)Morbid Obesity- -Low calorie diet, portion control and increase physical activity discussed with patient -Body mass index is 53.39 kg/m.  3)DM2-A1c 6.3 reflecting excellent DM control PTA continue current regimen  4) chronic respiratory failure--- due to COPD,  -at baseline uses 3 L of oxygen via nasal cannula -No increased oxygen requirement at this time, continue bronchodilators and oxygen supplementation  4)HTN-stable, okay to resume home meds upon discharge  Discharge Condition: stable  Follow UP   Follow-up Information    Savannah Houston, MD. Schedule an appointment as soon as possible for a visit in 4 day(s).   Specialty: Gastroenterology Why: Repeat CBC and BMP blood test as well as EGD and colonoscopy as outpatient with gastroenterologist Contact information: [place], [place] [address] [place] [address] [phone]                Consults obtained - Gi  Diet and Activity recommendation:  As advised  Discharge Instructions    Discharge Instructions    Call MD for:  difficulty breathing, headache or visual disturbances   Complete by: As directed    Call MD for:  persistant dizziness or light-headedness   Complete by: As directed    Call MD for:  persistant nausea and vomiting   Complete by: As directed    Call MD for:  temperature >100.4   Complete by: As directed     Diet - low sodium heart healthy   Complete by: As directed    Diet Carb Modified   Complete by: As directed    Discharge instructions   Complete by: As directed    1)Avoid ibuprofen/Advil/Aleve/Motrin/Goody Powders/Naproxen/BC powders/Meloxicam/Diclofenac/Indomethacin and other Nonsteroidal anti-inflammatory medications as these will make you more likely to bleed and can cause stomach ulcers, can also cause Kidney problems.   2)follow up with gastroenterologist within a week to schedule outpatient EGD and colonoscopy due to recurrent bleeding requiring transfusion  3)Repeat  CBC and BMP test within a week advised   Increase activity slowly   Complete by: As directed         Discharge Medications  Allergies as of 03/20/2020      Reactions   Keflex [cephalexin] Nausea And Vomiting      Medication List    STOP taking these medications   levofloxacin 0.5 % ophthalmic solution Commonly known as: QUIXIN     TAKE these medications   aspirin 81 MG tablet Take 81 mg by mouth daily. Reported on 06/01/2015   escitalopram 20 MG tablet Commonly known as: LEXAPRO Take 1 tablet (20 mg total) by mouth daily.   ferrous sulfate 325 (65 FE) MG tablet Take 1 tablet (325 mg total) by mouth 2 (two) times daily with a meal.   Glucometer Dex High Control Liqd Test CBG's once daily.   hydrOXYzine 10 MG tablet Commonly known as: ATARAX/VISTARIL Take 1 tablet (10 mg total) by mouth 3 (three) times daily as needed for anxiety.   lisinopril 10 MG tablet Commonly known as: ZESTRIL Take 1 tablet by mouth once daily   lovastatin 20 MG tablet Commonly known as: MEVACOR Take 1 tablet (20 mg total) by mouth at bedtime.   metFORMIN 1000 MG tablet Commonly known as: GLUCOPHAGE Take 1 tablet (1,000 mg total) by mouth 2 (two) times daily with a meal.   oxyCODONE-acetaminophen 10-325 MG tablet Commonly known as: PERCOCET Take 1 tablet by mouth every 6 (six) hours as needed for pain.    pantoprazole 40 MG tablet Commonly known as: Protonix Take 1 tablet (40 mg total) by mouth 2 (two) times daily. What changed: when to take this   potassium chloride SA 20 MEQ tablet Commonly known as: KLOR-CON Take 2 tablets (40 mEq total) by mouth 2 (two) times daily.   torsemide 20 MG tablet Commonly known as: DEMADEX Take 3 tablets (60 mg total) by mouth 2 (two) times daily.   True Metrix Air Glucose Meter w/Device Kit Check BS daily Dx E11.9   True Metrix Blood Glucose Test test strip Generic drug: glucose blood Check BS daily Dx E11.9   TRUEplus Lancets 30G Misc Check BS daily Dx E11.9       Major procedures and Radiology Reports - PLEASE review detailed and final reports for all details, in brief -   No results found.  Micro Results    Recent Results (from the past 240 hour(s))  Resp Panel by RT-PCR (Flu A&B, Covid) Nasopharyngeal Swab     Status: None   Collection Time: 03/19/20  4:32 PM   Specimen: Nasopharyngeal Swab; Nasopharyngeal(NP) swabs in vial transport medium  Result Value Ref Range Status   SARS Coronavirus 2 by RT PCR NEGATIVE NEGATIVE Final    Comment: (NOTE) SARS-CoV-2 target nucleic acids are NOT DETECTED.  The SARS-CoV-2 RNA is generally detectable in upper respiratory specimens during the acute phase of infection. The lowest concentration of SARS-CoV-2 viral copies this assay can detect is 138 copies/mL. A negative result does not preclude SARS-Cov-2 infection and should not be used as the sole basis for treatment or other patient management decisions. A negative result may occur with  improper specimen collection/handling, submission of specimen other than nasopharyngeal swab, presence of viral mutation(s) within the areas targeted by this assay, and inadequate number of viral copies(<138 copies/mL). A negative result must be combined with clinical observations, patient history, and epidemiological information. The expected result is  Negative.  Fact Sheet for Patients:  EntrepreneurPulse.com.au  Fact Sheet for Healthcare Providers:  IncredibleEmployment.be  This test is no t yet approved or cleared by the [place] FDA and  has been authorized for detection  and/or diagnosis of SARS-CoV-2 by FDA under an Emergency Use Authorization (EUA). This EUA will remain  in effect (meaning this test can be used) for the duration of the COVID-19 declaration under Section 564(b)(1) of the Act, 21 U.S.C.section 360bbb-3(b)(1), unless the authorization is terminated  or revoked sooner.       Influenza A by PCR NEGATIVE NEGATIVE Final   Influenza B by PCR NEGATIVE NEGATIVE Final    Comment: (NOTE) The Xpert Xpress SARS-CoV-2/FLU/RSV plus assay is intended as an aid in the diagnosis of influenza from Nasopharyngeal swab specimens and should not be used as a sole basis for treatment. Nasal washings and aspirates are unacceptable for Xpert Xpress SARS-CoV-2/FLU/RSV testing.  Fact Sheet for Patients: EntrepreneurPulse.com.au  Fact Sheet for Healthcare Providers: IncredibleEmployment.be  This test is not yet approved or cleared by the [place] FDA and has been authorized for detection and/or diagnosis of SARS-CoV-2 by FDA under an Emergency Use Authorization (EUA). This EUA will remain in effect (meaning this test can be used) for the duration of the COVID-19 declaration under Section 564(b)(1) of the Act, 21 U.S.C. section 360bbb-3(b)(1), unless the authorization is terminated or revoked.  Performed at [facility], [address], [place], [place] [address]        Today   [place] today has no new complaints Denies dizziness, no chest pains and palpitations -No BM today, no hematochezia today         Patient has been seen and examined prior to discharge   Objective   Blood pressure 127/67, pulse 85,  temperature 98.7 F (37.1 C), temperature source Oral, resp. rate 18, height 5' 2"  (1.575 m), weight 132.4 kg, SpO2 100 %.   Intake/Output Summary (Last 24 hours) at 03/20/2020 1146 Last data filed at 03/20/2020 0439 Gross per 24 hour  Intake 1083 ml  Output 1100 ml  Net -17 ml    Exam Gen:- Awake Alert, no acute distress , morbidly obese HEENT:- [place].AT, No sclera icterus Nose- [place] 3L/min Neck-Supple Neck,No JVD,.  Lungs-fair air movement, no wheezes  CV- S1, S2 normal, regular Abd-  +ve B.Sounds, Abd Soft, No tenderness, increased truncal adiposity Extremity/Skin:- No  edema,   good pulses Psych-affect is appropriate, oriented x3 Neuro-no new focal deficits, no tremors    Data Review   CBC w Diff:  Lab Results  Component Value Date   WBC 6.4 03/20/2020   HGB 8.1 (L) 03/20/2020   HGB 13.2 09/21/2016   HCT 27.2 (L) 03/20/2020   HCT 38.6 09/21/2016   PLT 132 (L) 03/20/2020   PLT 208 09/21/2016   LYMPHOPCT 25 03/20/2020   MONOPCT 10 03/20/2020   EOSPCT 2 03/20/2020   BASOPCT 0 03/20/2020    CMP:  Lab Results  Component Value Date   NA 136 03/19/2020   NA 139 02/07/2020   K 3.8 03/19/2020   CL 97 (L) 03/19/2020   CO2 31 03/19/2020   BUN 14 03/19/2020   BUN 15 02/07/2020   CREATININE 0.73 03/19/2020   CREATININE 0.71 07/29/2015   PROT 8.0 03/19/2020   PROT 7.1 10/29/2019   ALBUMIN 2.8 (L) 03/19/2020   ALBUMIN 3.2 (L) 10/29/2019   BILITOT 0.4 03/19/2020   BILITOT 0.2 10/29/2019   ALKPHOS 86 03/19/2020   AST 28 03/19/2020   ALT 17 03/19/2020  .   Total Discharge time is about 33 minutes  Roxan Hockey M.D on 03/20/2020 at 11:46 AM  Go to [email or web address] -  for  Psychologist, counselling  [facility] - Office  [phone]

## 2020-03-20 NOTE — Care Management Important Message (Signed)
Important Message  Patient Details  Name: Savannah Burns MRN: [id number] Date of Birth: 09/30/1950   Medicare Important Message Given:  Yes     Sherie Don, LCSW 03/20/2020, 12:22 PM

## 2020-03-21 LAB — TYPE AND SCREEN
ABO/RH(D): A POS
Antibody Screen: NEGATIVE
Unit division: 0
Unit division: 0

## 2020-03-21 LAB — BPAM RBC
Blood Product Expiration Date: 202201182359
Blood Product Expiration Date: 202201252359
ISSUE DATE / TIME: 202112231706
ISSUE DATE / TIME: 202112232230
Unit Type and Rh: 600
Unit Type and Rh: 600

## 2020-03-24 ENCOUNTER — Telehealth: Payer: Self-pay

## 2020-03-24 NOTE — Telephone Encounter (Signed)
Hi Dr. Abbey Chatters Pt Savannah Burns (DOB 19-Jun-1950) was admitted to the hospital 3 days before Christmas with internal bleeding again. Her Hgb was 6.1 when she got there. Upon release Christmas Eve it was 8.6. (all this pt's words). She was given 2 units of blood and told to follow up with another GI doctor but when she called them they in turn told her to come back to you. She was advised to get a follow up appt with you to find out where the bleed is coming from. How soon do you want to see the pt. Please advise.

## 2020-03-24 NOTE — Telephone Encounter (Signed)
Routed to [place] to schedule appt to be seen

## 2020-03-24 NOTE — Telephone Encounter (Signed)
PATIENT COMING Wednesday 03/25/20

## 2020-03-24 NOTE — Telephone Encounter (Signed)
noted 

## 2020-03-24 NOTE — Telephone Encounter (Signed)
Please get her in with either myself or one of the apps next available.  Thank you      Documentation     You routed conversation to Eloise Harman, DO 1 hour ago (10:33 AM)   You 1 hour ago (10:33 AM)       Hi Dr. Abbey Chatters Pt Savannah Burns (DOB [birth year]) was admitted to the hospital 3 days before Christmas with internal bleeding again. Her Hgb was 6.1 when she got there. Upon release Christmas Eve it was 8.6. (all this pt's words). She was given 2 units of blood and told to follow up with another GI doctor but when she called them they in turn told her to come back to you. She was advised to get a follow up appt with you to find out where the bleed is coming from. How soon do you want to see the pt. Please advise.      Documentation

## 2020-03-24 NOTE — Telephone Encounter (Signed)
Please get her in with either myself or one of the apps next available.  Thank you

## 2020-03-24 NOTE — Telephone Encounter (Signed)
Noted  

## 2020-03-25 ENCOUNTER — Encounter: Payer: Self-pay | Admitting: Internal Medicine

## 2020-03-25 ENCOUNTER — Other Ambulatory Visit: Payer: Self-pay

## 2020-03-25 ENCOUNTER — Ambulatory Visit: Payer: Medicare PPO | Admitting: Internal Medicine

## 2020-03-25 ENCOUNTER — Telehealth: Payer: Self-pay

## 2020-03-25 VITALS — BP 127/61 | HR 92 | Temp 97.5°F | Ht 62.0 in | Wt 290.6 lb

## 2020-03-25 DIAGNOSIS — R140 Abdominal distension (gaseous): Secondary | ICD-10-CM

## 2020-03-25 DIAGNOSIS — R932 Abnormal findings on diagnostic imaging of liver and biliary tract: Secondary | ICD-10-CM

## 2020-03-25 DIAGNOSIS — D500 Iron deficiency anemia secondary to blood loss (chronic): Secondary | ICD-10-CM

## 2020-03-25 DIAGNOSIS — I8500 Esophageal varices without bleeding: Secondary | ICD-10-CM | POA: Diagnosis not present

## 2020-03-25 DIAGNOSIS — J449 Chronic obstructive pulmonary disease, unspecified: Secondary | ICD-10-CM | POA: Diagnosis not present

## 2020-03-25 NOTE — Progress Notes (Signed)
Referring Provider: [name], DO Primary Care Physician:  [name], DO Primary GI:  Dr. Abbey Chatters  Chief Complaint  Patient presents with   low hemoglobin    HPI:   Savannah Burns is a 69 y.o. female who presents to the clinic today for hospital follow-up visit.  Patient recently admitted to our local facility for acute on chronic anemia.    She was admitted to the hospital for similar appearance in October with a hemoglobin of 3.8.  She received 5 units of blood.  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  She did have a right upper quadrant ultrasound during that admission which showed echogenic liver that was not the best exam due to patient's body habitus.    Patient states she was doing well until the week before Christmas when she started having worsening fatigue and shortness of breath.  She would then presented to the ER on 03/19/2020 was found to have hemoglobin of 6.5 down from her baseline around 8.  She was given 2 units of blood and discharged home a day later.  Patient denies any gross melena hematochezia.  States her fatigue is improved.  Continues to have shortness of breath of this is chronic.  Also notes some worsening abdominal distention  Past Medical History:  Diagnosis Date   Adrenal adenoma, left    Stable   Anxiety    Arthritis    bilateral hands   CHF (congestive heart failure) (HCC)    COPD (chronic obstructive pulmonary disease) (HCC)    Depression    Diabetes mellitus without complication (HCC)    Dyspnea    Esophageal varices (HCC)    Grade II diastolic dysfunction    History of kidney stones    Hyperlipidemia    Hypertension    Lower back pain    Panic attacks    Pneumonia    currently taking antibiotic and prednisone for early  stages of pneumonia   Pulmonary nodules    bilateral   Skin cancer    face    Past Surgical History:  Procedure Laterality Date   Breast Cystectomy  Right    CESAREAN SECTION     COLONOSCOPY WITH PROPOFOL N/A 01/25/2020   Dr. Abbey Chatters: Nonbleeding internal hemorrhoids, diverticulosis, 5 mm polyp removed from the ascending colon, 10 mm polyp removed from the sigmoid colon, 30 mm polyp (tubulovillous adenoma with no high-grade dysplasia) removed from the transverse colon via piecemeal status post tattoo.  Other polyps were tubular adenomas.  3 month surveillance colonoscopy recommended.   CYSTOSCOPY/URETEROSCOPY/HOLMIUM LASER/STENT PLACEMENT Bilateral 03/01/2019   Procedure: CYSTOSCOPY/RETROGRADEURETEROSCOPY/HOLMIUM LASER/STENT PLACEMENT;  Surgeon: Ceasar Mons, MD;  Location: [facility];  Service: Urology;  Laterality: Bilateral;  ONLY NEEDS 60 MIN   ESOPHAGOGASTRODUODENOSCOPY (EGD) WITH PROPOFOL N/A 01/25/2020   Dr. Abbey Chatters: 4 columns grade 1 esophageal varices   POLYPECTOMY  01/25/2020   Procedure: POLYPECTOMY;  Surgeon: Eloise Harman, DO;  Location: [facility];  Service: Endoscopy;;   SKIN CANCER EXCISION     Face   SPINE SURGERY     SUBMUCOSAL TATTOO INJECTION  01/25/2020   Procedure: SUBMUCOSAL TATTOO INJECTION;  Surgeon: Eloise Harman, DO;  Location: [facility];  Service: Endoscopy;;    Current Outpatient Medications  Medication Sig Dispense Refill   Blood Glucose Calibration (GLUCOMETER  DEX HIGH CONTROL) LIQD Test CBG's once daily.      Blood Glucose Monitoring Suppl (TRUE METRIX AIR GLUCOSE METER) w/Device KIT Check BS daily Dx E11.9 1 kit 0   escitalopram (LEXAPRO) 20 MG tablet Take 1 tablet (20 mg total) by mouth daily. 90 tablet 0   ferrous sulfate 325 (65 FE) MG tablet Take 1 tablet (325 mg total) by mouth 2 (two) times daily with a meal. 60 tablet 1   glucose blood (TRUE METRIX BLOOD GLUCOSE TEST) test strip Check BS daily Dx E11.9 100  each 3   lisinopril (ZESTRIL) 10 MG tablet Take 1 tablet by mouth once daily 90 tablet 0   lovastatin (MEVACOR) 20 MG tablet Take 1 tablet (20 mg total) by mouth at bedtime. 90 tablet 3   metFORMIN (GLUCOPHAGE) 1000 MG tablet Take 1 tablet (1,000 mg total) by mouth 2 (two) times daily with a meal. 180 tablet 3   oxyCODONE-acetaminophen (PERCOCET) 10-325 MG tablet Take 1 tablet by mouth every 6 (six) hours as needed for pain.     pantoprazole (PROTONIX) 40 MG tablet Take 1 tablet (40 mg total) by mouth 2 (two) times daily. 60 tablet 2   potassium chloride SA (KLOR-CON) 20 MEQ tablet Take 2 tablets (40 mEq total) by mouth 2 (two) times daily. 360 tablet 3   torsemide (DEMADEX) 20 MG tablet Take 3 tablets (60 mg total) by mouth 2 (two) times daily. 180 tablet 11   TRUEplus Lancets 30G MISC Check BS daily Dx E11.9 100 each 3   aspirin 81 MG tablet Take 81 mg by mouth daily. Reported on 06/01/2015 (Patient not taking: Reported on 03/25/2020)     hydrOXYzine (ATARAX/VISTARIL) 10 MG tablet Take 1 tablet (10 mg total) by mouth 3 (three) times daily as needed for anxiety. (Patient not taking: No sig reported) 60 tablet 2   No current facility-administered medications for this visit.    Allergies as of 03/25/2020 - Review Complete 03/25/2020  Allergen Reaction Noted   Keflex [cephalexin] Nausea And Vomiting 04/17/2019    Family History  Problem Relation Age of Onset   Diabetes Father    Heart disease Father 53       MI   Hypertension Father    Anemia Mother        Transfusion dependent   COPD Sister    Cancer Paternal Grandmother 6       Pancreatic    Social History   Socioeconomic History   Marital status: Widowed    Spouse name: Not on file   Number of children: 2   Years of education: 14   Highest education level: Not on file  Occupational History   Not on file  Tobacco Use   Smoking status: Former Smoker    Packs/day: 1.50    Years: 40.00    Pack years:  60.00    Types: Cigarettes    Quit date: 04/29/2015    Years since quitting: 4.9   Smokeless tobacco: Former Systems developer    Quit date: 04/29/2015   Tobacco comment: Quit smoking 04/2015- Previous 1.5 ppd smoker  Vaping Use   Vaping Use: Never used  Substance and Sexual Activity   Alcohol use: No    Alcohol/week: 0.0 standard drinks   Drug use: No   Sexual activity: Not Currently    Birth control/protection: Post-menopausal  Other Topics Concern   Not on file  Social History Narrative   Not on file   Social Determinants of Health  Financial Resource Strain: Low Risk    Difficulty of Paying Living Expenses: Not very hard  Food Insecurity: Not on file  Transportation Needs: No Transportation Needs   Lack of Transportation (Medical): No   Lack of Transportation (Non-Medical): No  Physical Activity: Inactive   Days of Exercise per Week: 0 days   Minutes of Exercise per Session: 0 min  Stress: Not on file  Social Connections: Not on file    Subjective: Review of Systems  Constitutional: Positive for malaise/fatigue. Negative for chills and fever.  HENT: Negative for congestion and hearing loss.   Eyes: Negative for blurred vision and double vision.  Respiratory: Positive for shortness of breath. Negative for cough.   Cardiovascular: Negative for chest pain and palpitations.  Gastrointestinal: Negative for abdominal pain, blood in stool, constipation, diarrhea, heartburn, melena and vomiting.  Genitourinary: Negative for dysuria and urgency.  Musculoskeletal: Negative for joint pain and myalgias.  Skin: Negative for itching and rash.  Neurological: Negative for dizziness and headaches.  Psychiatric/Behavioral: Negative for depression. The patient is not nervous/anxious.      Objective: BP 127/61    Pulse 92    Temp (!) 97.5 F (36.4 C) (Temporal)    Ht _0  (1.575 m)    Wt 290 lb 9.6 oz (131.8 kg)    BMI 53.15 kg/m  Physical Exam Constitutional:      Appearance:  Normal appearance. She is obese.  HENT:     Head: Normocephalic and atraumatic.  Eyes:     Extraocular Movements: Extraocular movements intact.     Conjunctiva/sclera: Conjunctivae normal.  Cardiovascular:     Rate and Rhythm: Normal rate and regular rhythm.  Pulmonary:     Effort: Pulmonary effort is normal.     Breath sounds: Normal breath sounds.  Abdominal:     General: Bowel sounds are normal.     Palpations: Abdomen is soft.  Musculoskeletal:        General: No swelling. Normal range of motion.     Cervical back: Normal range of motion and neck supple.  Skin:    General: Skin is warm and dry.     Coloration: Skin is not jaundiced.  Neurological:     General: No focal deficit present.     Mental Status: She is alert and oriented to person, place, and time.  Psychiatric:        Mood and Affect: Mood normal.        Behavior: Behavior normal.      Assessment: *Acute on chronic anemia *Tubulovillous adenoma *GI: Blood loss *Chronic liver disease *Abdominal distention  Plan: Etiology of patient's acute on chronic anemia unclear.  She is due for colonoscopy for surveillance purposes for the large tubulovillous adenoma removed a few months ago, which we will schedule today.  At the same time we will evaluate for other causes of GI blood loss.  If this is unremarkable, we will need to pursue capsule endoscopy as a next step to evaluate her anemia.  Given esophageal varices seen on EGD as well as highly echogenic liver seen on ultrasound, I am concerned patient may have underlying cirrhosis.  We will order cross-sectional imaging with CT with contrast to further evaluate.  This will also further evaluate her abdominal distention as well.  We will check CBC in office today to ensure patient's blood counts are stable.  I counseled her that if she has worsening fatigue or shortness of breath, chest pain, or notices any significant GI  bleeding, that she needs to present to the ER  and she understands.  03/25/2020 2:05 PM   Disclaimer: This note was dictated with voice recognition software. Similar sounding words can inadvertently be transcribed and may not be corrected upon review.

## 2020-03-25 NOTE — Telephone Encounter (Signed)
CT abd/pelvis w/contrast 04/03/20 at 1:00pm, arrive at 12:45pm. NPO 4 hours prior to test. Pickup contrast prior to test.  Tried to call pt to inform her of CT appt and schedule TCS, LMOVM for return call.  PA for CT abdomen/pelvis w/contrast submitted via [employer] website. [employer]# [id number], valid 04/03/20-05/03/20.

## 2020-03-25 NOTE — Patient Instructions (Signed)
We will schedule you for colonoscopy to evaluate the post polypectomy site for bleeding as well as to ensure that all of the polyp is gone.  I will also order CT abdomen pelvis to further evaluate your liver, abdominal distention.  I will also order CBC today to check your blood counts.  If you have worsening shortness of breath, chest pain, or profuse GI bleeding, you will need to present to the local emergency room.  At [facility] we value your feedback. You may receive a survey about your visit today. Please share your experience as we strive to create trusting relationships with our patients to provide genuine, compassionate, quality care.  We appreciate your understanding and patience as we review any laboratory studies, imaging, and other diagnostic tests that are ordered as we care for you. Our office policy is 5 business days for review of these results, and any emergent or urgent results are addressed in a timely manner for your best interest. If you do not hear from our office in 1 week, please contact us.   We also encourage the use of MyChart, which contains your medical information for your review as well. If you are not enrolled in this feature, an access code is on this after visit summary for your convenience. Thank you for allowing [place] to be involved in your care.  It was great to see you today!  I hope you have a great rest of your winter!!    Elon Alas. Abbey Chatters, D.O. Gastroenterology and Hepatology [facility]

## 2020-03-25 NOTE — H&P (View-Only) (Signed)
° ° °Referring Provider: Gottschalk, Ashly M, DO °Primary Care Physician:  Gottschalk, Ashly M, DO °Primary GI:  Dr. Shaeleigh Graw ° °Chief Complaint  °Patient presents with  °• low hemoglobin  ° ° °HPI:   °Savannah Burns is a 69 y.o. female who presents to the clinic today for hospital follow-up visit.  Patient recently admitted to our local facility for acute on chronic anemia.   ° °She was admitted to the hospital for similar appearance in October with a hemoglobin of 3.8.  She received 5 units of blood.  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  She did have a right upper quadrant ultrasound during that admission which showed echogenic liver that was not the best exam due to patient's body habitus.   ° °Patient states she was doing well until the week before Christmas when she started having worsening fatigue and shortness of breath.  She would then presented to the ER on 03/19/2020 was found to have hemoglobin of 6.5 down from her baseline around 8.  She was given 2 units of blood and discharged home a day later.  Patient denies any gross melena hematochezia.  States her fatigue is improved.  Continues to have shortness of breath of this is chronic.  Also notes some worsening abdominal distention ° °Past Medical History:  °Diagnosis Date  °• Adrenal adenoma, left   ° Stable  °• Anxiety   °• Arthritis   ° bilateral hands  °• CHF (congestive heart failure) (HCC)   °• COPD (chronic obstructive pulmonary disease) (HCC)   °• Depression   °• Diabetes mellitus without complication (HCC)   °• Dyspnea   °• Esophageal varices (HCC)   °• Grade II diastolic dysfunction   °• History of kidney stones   °• Hyperlipidemia   °• Hypertension   °• Lower back pain   °• Panic attacks   °• Pneumonia   ° currently taking antibiotic and prednisone for early  stages of pneumonia  °• Pulmonary nodules   ° bilateral  °• Skin cancer   ° face  ° ° °Past Surgical History:  °Procedure Laterality Date  °• Breast Cystectomy  Right   °• CESAREAN SECTION    °• COLONOSCOPY WITH PROPOFOL N/A 01/25/2020  ° Dr. Cranford Blessinger: Nonbleeding internal hemorrhoids, diverticulosis, 5 mm polyp removed from the ascending colon, 10 mm polyp removed from the sigmoid colon, 30 mm polyp (tubulovillous adenoma with no high-grade dysplasia) removed from the transverse colon via piecemeal status post tattoo.  Other polyps were tubular adenomas.  3 month surveillance colonoscopy recommended.  °• CYSTOSCOPY/URETEROSCOPY/HOLMIUM LASER/STENT PLACEMENT Bilateral 03/01/2019  ° Procedure: CYSTOSCOPY/RETROGRADEURETEROSCOPY/HOLMIUM LASER/STENT PLACEMENT;  Surgeon: Winter, Christopher Aaron, MD;  Location: [facility];  Service: Urology;  Laterality: Bilateral;  ONLY NEEDS 60 MIN  °• ESOPHAGOGASTRODUODENOSCOPY (EGD) WITH PROPOFOL N/A 01/25/2020  ° Dr. Leeya Rusconi: 4 columns grade 1 esophageal varices  °• POLYPECTOMY  01/25/2020  ° Procedure: POLYPECTOMY;  Surgeon: Samanthia Howland K, DO;  [facility]: [facility];  Service: Endoscopy;;  °• SKIN CANCER EXCISION    ° Face  °• SPINE SURGERY    °• SUBMUCOSAL TATTOO INJECTION  01/25/2020  ° Procedure: SUBMUCOSAL TATTOO INJECTION;  Surgeon: Budd Freiermuth K, DO;  Location: [facility];  Service: Endoscopy;;  ° ° °Current Outpatient Medications  °Medication Sig Dispense Refill  °• Blood Glucose Calibration (GLUCOMETER   DEX HIGH CONTROL) LIQD Test CBG's once daily.     °• Blood Glucose Monitoring Suppl (TRUE METRIX AIR GLUCOSE METER) w/Device KIT Check BS daily Dx E11.9 1 kit 0  °• escitalopram (LEXAPRO) 20 MG tablet Take 1 tablet (20 mg total) by mouth daily. 90 tablet 0  °• ferrous sulfate 325 (65 FE) MG tablet Take 1 tablet (325 mg total) by mouth 2 (two) times daily with a meal. 60 tablet 1  °• glucose blood (TRUE METRIX BLOOD GLUCOSE TEST) test strip Check BS daily Dx E11.9 100  each 3  °• lisinopril (ZESTRIL) 10 MG tablet Take 1 tablet by mouth once daily 90 tablet 0  °• lovastatin (MEVACOR) 20 MG tablet Take 1 tablet (20 mg total) by mouth at bedtime. 90 tablet 3  °• metFORMIN (GLUCOPHAGE) 1000 MG tablet Take 1 tablet (1,000 mg total) by mouth 2 (two) times daily with a meal. 180 tablet 3  °• oxyCODONE-acetaminophen (PERCOCET) 10-325 MG tablet Take 1 tablet by mouth every 6 (six) hours as needed for pain.    °• pantoprazole (PROTONIX) 40 MG tablet Take 1 tablet (40 mg total) by mouth 2 (two) times daily. 60 tablet 2  °• potassium chloride SA (KLOR-CON) 20 MEQ tablet Take 2 tablets (40 mEq total) by mouth 2 (two) times daily. 360 tablet 3  °• torsemide (DEMADEX) 20 MG tablet Take 3 tablets (60 mg total) by mouth 2 (two) times daily. 180 tablet 11  °• TRUEplus Lancets 30G MISC Check BS daily Dx E11.9 100 each 3  °• aspirin 81 MG tablet Take 81 mg by mouth daily. Reported on 06/01/2015 (Patient not taking: Reported on 03/25/2020)    °• hydrOXYzine (ATARAX/VISTARIL) 10 MG tablet Take 1 tablet (10 mg total) by mouth 3 (three) times daily as needed for anxiety. (Patient not taking: No sig reported) 60 tablet 2  ° °No current facility-administered medications for this visit.  ° ° °Allergies as of 03/25/2020 - Review Complete 03/25/2020  °Allergen Reaction Noted  °• Keflex [cephalexin] Nausea And Vomiting 04/17/2019  ° ° °Family History  °Problem Relation Age of Onset  °• Diabetes Father   °• Heart disease Father 75  °     MI  °• Hypertension Father   °• Anemia Mother   °     Transfusion dependent  °• COPD Sister   °• Cancer Paternal Grandmother 85  °     Pancreatic  ° ° °Social History  ° °Socioeconomic History  °• Marital status: Widowed  °  Spouse name: Not on file  °• Number of children: 2  °• Years of education: 14  °• Highest education level: Not on file  °Occupational History  °• Not on file  °Tobacco Use  °• Smoking status: Former Smoker  °  Packs/day: 1.50  °  Years: 40.00  °  Pack years:  60.00  °  Types: Cigarettes  °  Quit date: 04/29/2015  °  Years since quitting: 4.9  °• Smokeless tobacco: Former User  °  Quit date: 04/29/2015  °• Tobacco comment: Quit smoking 04/2015- Previous 1.5 ppd smoker  °Vaping Use  °• Vaping Use: Never used  °Substance and Sexual Activity  °• Alcohol use: No  °  Alcohol/week: 0.0 standard drinks  °• Drug use: No  °• Sexual activity: Not Currently  °  Birth control/protection: Post-menopausal  °Other Topics Concern  °• Not on file  °Social History Narrative  °• Not on file  ° °Social Determinants of Health  ° °  Financial Resource Strain: Low Risk   °• Difficulty of Paying Living Expenses: Not very hard  °Food Insecurity: Not on file  °Transportation Needs: No Transportation Needs  °• Lack of Transportation (Medical): No  °• Lack of Transportation (Non-Medical): No  °Physical Activity: Inactive  °• Days of Exercise per Week: 0 days  °• Minutes of Exercise per Session: 0 min  °Stress: Not on file  °Social Connections: Not on file  ° ° °Subjective: °Review of Systems  °Constitutional: Positive for malaise/fatigue. Negative for chills and fever.  °HENT: Negative for congestion and hearing loss.   °Eyes: Negative for blurred vision and double vision.  °Respiratory: Positive for shortness of breath. Negative for cough.   °Cardiovascular: Negative for chest pain and palpitations.  °Gastrointestinal: Negative for abdominal pain, blood in stool, constipation, diarrhea, heartburn, melena and vomiting.  °Genitourinary: Negative for dysuria and urgency.  °Musculoskeletal: Negative for joint pain and myalgias.  °Skin: Negative for itching and rash.  °Neurological: Negative for dizziness and headaches.  °Psychiatric/Behavioral: Negative for depression. The patient is not nervous/anxious.   ° ° ° °Objective: °BP 127/61    Pulse 92    Temp (!) 97.5 °F (36.4 °C) (Temporal)    Ht 5' 2" (1.575 m)    Wt 290 lb 9.6 oz (131.8 kg)    BMI 53.15 kg/m²  °Physical Exam °Constitutional:   °   Appearance:  Normal appearance. She is obese.  °HENT:  °   Head: Normocephalic and atraumatic.  °Eyes:  °   Extraocular Movements: Extraocular movements intact.  °   Conjunctiva/sclera: Conjunctivae normal.  °Cardiovascular:  °   Rate and Rhythm: Normal rate and regular rhythm.  °Pulmonary:  °   Effort: Pulmonary effort is normal.  °   Breath sounds: Normal breath sounds.  °Abdominal:  °   General: Bowel sounds are normal.  °   Palpations: Abdomen is soft.  °Musculoskeletal:     °   General: No swelling. Normal range of motion.  °   Cervical back: Normal range of motion and neck supple.  °Skin: °   General: Skin is warm and dry.  °   Coloration: Skin is not jaundiced.  °Neurological:  °   General: No focal deficit present.  °   Mental Status: She is alert and oriented to person, place, and time.  °Psychiatric:     °   Mood and Affect: Mood normal.     °   Behavior: Behavior normal.  ° ° ° ° °Assessment: °*Acute on chronic anemia °*Tubulovillous adenoma °*GI: Blood loss °*Chronic liver disease °*Abdominal distention ° °Plan: °Etiology of patient's acute on chronic anemia unclear.  She is due for colonoscopy for surveillance purposes for the large tubulovillous adenoma removed a few months ago, which we will schedule today.  At the same time we will evaluate for other causes of GI blood loss.  If this is unremarkable, we will need to pursue capsule endoscopy as a next step to evaluate her anemia. ° °Given esophageal varices seen on EGD as well as highly echogenic liver seen on ultrasound, I am concerned patient may have underlying cirrhosis.  We will order cross-sectional imaging with CT with contrast to further evaluate.  This will also further evaluate her abdominal distention as well. ° °We will check CBC in office today to ensure patient's blood counts are stable.  I counseled her that if she has worsening fatigue or shortness of breath, chest pain, or notices any significant GI   bleeding, that she needs to present to the ER  and she understands. ° °03/25/2020 2:05 PM ° ° °Disclaimer: This note was dictated with voice recognition software. Similar sounding words can inadvertently be transcribed and may not be corrected upon review. ° °

## 2020-03-26 ENCOUNTER — Other Ambulatory Visit: Payer: Self-pay

## 2020-03-26 ENCOUNTER — Telehealth: Payer: Self-pay | Admitting: Internal Medicine

## 2020-03-26 LAB — CBC
Hematocrit: 29.8 % — ABNORMAL LOW (ref 34.0–46.6)
Hemoglobin: 8.8 g/dL — ABNORMAL LOW (ref 11.1–15.9)
MCH: 24.9 pg — ABNORMAL LOW (ref 26.6–33.0)
MCHC: 29.5 g/dL — ABNORMAL LOW (ref 31.5–35.7)
MCV: 84 fL (ref 79–97)
Platelets: 142 10*3/uL — ABNORMAL LOW (ref 150–450)
RBC: 3.53 x10E6/uL — ABNORMAL LOW (ref 3.77–5.28)
RDW: 17 % — ABNORMAL HIGH (ref 11.7–15.4)
WBC: 6.2 10*3/uL (ref 3.4–10.8)

## 2020-03-26 NOTE — Telephone Encounter (Signed)
See other phone note

## 2020-03-26 NOTE — Telephone Encounter (Signed)
Patient returned call, please call back

## 2020-03-26 NOTE — Telephone Encounter (Signed)
Pt called office, informed her of pre-op/covid test appt.

## 2020-03-26 NOTE — Telephone Encounter (Signed)
Pre-op/covid test 04/03/20 at 3:00pm. Tried to call pt, no answer. Appt letter and procedure instructions placed at front desk for her to pickup next week.

## 2020-03-26 NOTE — Telephone Encounter (Signed)
Called pt, informed her of CT appt and letter mailed. TCS scheduled for 04/07/20 at 3:00pm. She will come by office next week to pick up instructions order entered.  PA for TCS submitted via [employer] website. [employer]# [id number], valid  04/07/20-05/07/20.

## 2020-03-31 ENCOUNTER — Ambulatory Visit: Payer: Medicare PPO | Admitting: Family Medicine

## 2020-04-02 ENCOUNTER — Encounter (HOSPITAL_COMMUNITY): Payer: Self-pay

## 2020-04-02 NOTE — Patient Instructions (Addendum)
Savannah Burns  04/02/2020     @PREFPERIOPPHARMACY @   Your procedure is scheduled on 04/07/2020.  Report to [facility] at 1:00 pm A.M.  Call this number if you have problems the morning of surgery:  [phone]   Remember:  Do not eat or drink after midnight.    Please follow the diet and prep instructions given to you by Dr Ave Filter office    Take these medicines the morning of surgery with A SIP OF WATER : Protonix and Demadex    Do not wear jewelry, make-up or nail polish.  Do not wear lotions, powders, or perfumes, or deodorant.  Do not shave 48 hours prior to surgery.  Men may shave face and neck.  Do not bring valuables to the hospital.  [name] is not responsible for any belongings or valuables.  Contacts, dentures or bridgework may not be worn into surgery.  Leave your suitcase in the car.  After surgery it may be brought to your room.  For patients admitted to the hospital, discharge time will be determined by your treatment team.  Patients discharged the day of surgery will not be allowed to drive home.   Name and phone number of your driver:   family Special instructions:  n/a  Please read over the following fact sheets that you were given. Care and Recovery After Surgery    Colonoscopy, Adult A colonoscopy is a procedure to look at the entire large intestine. This procedure is done using a long, thin, flexible tube that has a camera on the end. You may have a colonoscopy:  As a part of normal colorectal screening.  If you have certain symptoms, such as: ? A low number of red blood cells in your blood (anemia). ? Diarrhea that does not go away. ? Pain in your abdomen. ? Blood in your stool. A colonoscopy can help screen for and diagnose medical problems, including:  Tumors.  Extra tissue that grows where mucus forms (polyps).  Inflammation.  Areas of bleeding. Tell your health care provider about:  Any allergies you have.  All  medicines you are taking, including vitamins, herbs, eye drops, creams, and over-the-counter medicines.  Any problems you or family members have had with anesthetic medicines.  Any blood disorders you have.  Any surgeries you have had.  Any medical conditions you have.  Any problems you have had with having bowel movements.  Whether you are pregnant or may be pregnant. What are the risks? Generally, this is a safe procedure. However, problems may occur, including:  Bleeding.  Damage to your intestine.  Allergic reactions to medicines given during the procedure.  Infection. This is rare. What happens before the procedure? Eating and drinking restrictions Follow instructions from your health care provider about eating or drinking restrictions, which may include:  A few days before the procedure: ? Follow a low-fiber diet. ? Avoid nuts, seeds, dried fruit, raw fruits, and vegetables.  1-3 days before the procedure: ? Eat only gelatin dessert or ice pops. ? Drink only clear liquids, such as water, clear juice, clear broth or bouillon, black coffee or tea, or clear soft drinks or sports drinks. ? Avoid liquids that contain red or purple dye.  The day of the procedure: ? Do not eat solid foods. You may continue to drink clear liquids until up to 2 hours before the procedure. ? Do not eat or drink anything starting 2 hours before the procedure, or within the time period  that your health care provider recommends. Bowel prep If you were prescribed a bowel prep to take by mouth (orally) to clean out your colon:  Take it as told by your health care provider. Starting the day before your procedure, you will need to drink a large amount of liquid medicine. The liquid will cause you to have many bowel movements of loose stool until your stool becomes almost clear or light green.  If your skin or the opening between the buttocks (anus) gets irritated from diarrhea, you may relieve the  irritation using: ? Wipes with medicine in them, such as adult wet wipes with aloe and vitamin E. ? A product to soothe skin, such as petroleum jelly.  If you vomit while drinking the bowel prep: ? Take a break for up to 60 minutes. ? Begin the bowel prep again. ? Call your health care provider if you keep vomiting or you cannot take the bowel prep without vomiting.  To clean out your colon, you may also be given: ? Laxative medicines. These help you have a bowel movement. ? Instructions for enema use. An enema is liquid medicine injected into your rectum. Medicines Ask your health care provider about:  Changing or stopping your regular medicines or supplements. This is especially important if you are taking iron supplements, diabetes medicines, or blood thinners.  Taking medicines such as aspirin and ibuprofen. These medicines can thin your blood. Do not take these medicines unless your health care provider tells you to take them.  Taking over-the-counter medicines, vitamins, herbs, and supplements. General instructions  Ask your health care provider what steps will be taken to help prevent infection. These may include washing skin with a germ-killing soap.  Plan to have someone take you home from the hospital or clinic. What happens during the procedure?   An IV will be inserted into one of your veins.  You may be given one or more of the following: ? A medicine to help you relax (sedative). ? A medicine to numb the area (local anesthetic). ? A medicine to make you fall asleep (general anesthetic). This is rarely needed.  You will lie on your side with your knees bent.  The tube will: ? Have oil or gel put on it (be lubricated). ? Be inserted into your anus. ? Be gently eased through all parts of your large intestine.  Air will be sent into your colon to keep it open. This may cause some pressure or cramping.  Images will be taken with the camera and will appear on a  screen.  A small tissue sample may be removed to be looked at under a microscope (biopsy). The tissue may be sent to a lab for testing if any signs of problems are found.  If small polyps are found, they may be removed and checked for cancer cells.  When the procedure is finished, the tube will be removed. The procedure may vary among health care providers and hospitals. What happens after the procedure?  Your blood pressure, heart rate, breathing rate, and blood oxygen level will be monitored until you leave the hospital or clinic.  You may have a small amount of blood in your stool.  You may pass gas and have mild cramping or bloating in your abdomen. This is caused by the air that was used to open your colon during the exam.  Do not drive for 24 hours after the procedure.  It is up to you to get the results  of your procedure. Ask your health care provider, or the department that is doing the procedure, when your results will be ready. Summary  A colonoscopy is a procedure to look at the entire large intestine.  Follow instructions from your health care provider about eating and drinking before the procedure.  If you were prescribed an oral bowel prep to clean out your colon, take it as told by your health care provider.  During the colonoscopy, a flexible tube with a camera on its end is inserted into the anus and then passed into the other parts of the large intestine. This information is not intended to replace advice given to you by your health care provider. Make sure you discuss any questions you have with your health care provider. Document Revised: 10/05/2018 Document Reviewed: 10/05/2018 Elsevier Patient Education  2020 [place] Anesthesia is a term that refers to techniques, procedures, and medicines that help a person stay safe and comfortable during a medical procedure. Monitored anesthesia care, or sedation, is one type of anesthesia.  Your anesthesia specialist may recommend sedation if you will be having a procedure that does not require you to be unconscious, such as:  Cataract surgery.  A dental procedure.  A biopsy.  A colonoscopy. During the procedure, you may receive a medicine to help you relax (sedative). There are three levels of sedation:  Mild sedation. At this level, you may feel awake and relaxed. You will be able to follow directions.  Moderate sedation. At this level, you will be sleepy. You may not remember the procedure.  Deep sedation. At this level, you will be asleep. You will not remember the procedure. The more medicine you are given, the deeper your level of sedation will be. Depending on how you respond to the procedure, the anesthesia specialist may change your level of sedation or the type of anesthesia to fit your needs. An anesthesia specialist will monitor you closely during the procedure. Let your health care provider know about:  Any allergies you have.  All medicines you are taking, including vitamins, herbs, eye drops, creams, and over-the-counter medicines.  Any use of steroids (by mouth or as a cream).  Any problems you or family members have had with sedatives and anesthetic medicines.  Any blood disorders you have.  Any surgeries you have had.  Any medical conditions you have, such as sleep apnea.  Whether you are pregnant or may be pregnant.  Any use of cigarettes, alcohol, or street drugs. What are the risks? Generally, this is a safe procedure. However, problems may occur, including:  Getting too much medicine (oversedation).  Nausea.  Allergic reaction to medicines.  Trouble breathing. If this happens, a breathing tube may be used to help with breathing. It will be removed when you are awake and breathing on your own.  Heart trouble.  Lung trouble. Before the procedure Staying hydrated Follow instructions from your health care provider about hydration,  which may include:  Up to 2 hours before the procedure - you may continue to drink clear liquids, such as water, clear fruit juice, black coffee, and plain tea. Eating and drinking restrictions Follow instructions from your health care provider about eating and drinking, which may include:  8 hours before the procedure - stop eating heavy meals or foods such as meat, fried foods, or fatty foods.  6 hours before the procedure - stop eating light meals or foods, such as toast or cereal.  6 hours  before the procedure - stop drinking milk or drinks that contain milk.  2 hours before the procedure - stop drinking clear liquids. Medicines Ask your health care provider about:  Changing or stopping your regular medicines. This is especially important if you are taking diabetes medicines or blood thinners.  Taking medicines such as aspirin and ibuprofen. These medicines can thin your blood. Do not take these medicines before your procedure if your health care provider instructs you not to. Tests and exams  You will have a physical exam.  You may have blood tests done to show: ? How well your kidneys and liver are working. ? How well your blood can clot. General instructions  Plan to have someone take you home from the hospital or clinic.  If you will be going home right after the procedure, plan to have someone with you for 24 hours.  What happens during the procedure?  Your blood pressure, heart rate, breathing, level of pain and overall condition will be monitored.  An IV tube will be inserted into one of your veins.  Your anesthesia specialist will give you medicines as needed to keep you comfortable during the procedure. This may mean changing the level of sedation.  The procedure will be performed. After the procedure  Your blood pressure, heart rate, breathing rate, and blood oxygen level will be monitored until the medicines you were given have worn off.  Do not drive for  24 hours if you received a sedative.  You may: ? Feel sleepy, clumsy, or nauseous. ? Feel forgetful about what happened after the procedure. ? Have a sore throat if you had a breathing tube during the procedure. ? Vomit. This information is not intended to replace advice given to you by your health care provider. Make sure you discuss any questions you have with your health care provider. Document Revised: 02/24/2017 Document Reviewed: 07/05/2015 Elsevier Patient Education  [place].

## 2020-04-03 ENCOUNTER — Encounter (HOSPITAL_COMMUNITY)
Admission: RE | Admit: 2020-04-03 | Discharge: 2020-04-03 | Disposition: A | Discharge status: Home / Self Care | Payer: Medicare PPO | Source: Ambulatory Visit | Attending: Internal Medicine | Admitting: Internal Medicine

## 2020-04-03 ENCOUNTER — Other Ambulatory Visit: Payer: Self-pay

## 2020-04-03 ENCOUNTER — Encounter (HOSPITAL_COMMUNITY): Payer: Self-pay

## 2020-04-03 ENCOUNTER — Ambulatory Visit (HOSPITAL_COMMUNITY)
Admission: RE | Admit: 2020-04-03 | Discharge: 2020-04-03 | Disposition: A | Discharge status: Home / Self Care | Payer: Medicare PPO | Source: Ambulatory Visit | Attending: Internal Medicine | Admitting: Internal Medicine

## 2020-04-03 ENCOUNTER — Other Ambulatory Visit (HOSPITAL_COMMUNITY)
Admission: RE | Admit: 2020-04-03 | Discharge: 2020-04-03 | Disposition: A | Discharge status: Home / Self Care | Payer: Medicare PPO | Source: Ambulatory Visit | Attending: Internal Medicine | Admitting: Internal Medicine

## 2020-04-03 DIAGNOSIS — Z20822 Contact with and (suspected) exposure to covid-19: Secondary | ICD-10-CM | POA: Diagnosis not present

## 2020-04-03 DIAGNOSIS — K7460 Unspecified cirrhosis of liver: Secondary | ICD-10-CM | POA: Insufficient documentation

## 2020-04-03 DIAGNOSIS — K8020 Calculus of gallbladder without cholecystitis without obstruction: Secondary | ICD-10-CM | POA: Insufficient documentation

## 2020-04-03 DIAGNOSIS — N200 Calculus of kidney: Secondary | ICD-10-CM | POA: Insufficient documentation

## 2020-04-03 DIAGNOSIS — Z01812 Encounter for preprocedural laboratory examination: Secondary | ICD-10-CM | POA: Insufficient documentation

## 2020-04-03 DIAGNOSIS — R140 Abdominal distension (gaseous): Secondary | ICD-10-CM | POA: Insufficient documentation

## 2020-04-03 DIAGNOSIS — G8929 Other chronic pain: Secondary | ICD-10-CM | POA: Diagnosis not present

## 2020-04-03 DIAGNOSIS — I8510 Secondary esophageal varices without bleeding: Secondary | ICD-10-CM | POA: Insufficient documentation

## 2020-04-03 DIAGNOSIS — M545 Low back pain, unspecified: Secondary | ICD-10-CM | POA: Diagnosis not present

## 2020-04-03 DIAGNOSIS — Z79899 Other long term (current) drug therapy: Secondary | ICD-10-CM | POA: Diagnosis not present

## 2020-04-03 DIAGNOSIS — G894 Chronic pain syndrome: Secondary | ICD-10-CM | POA: Diagnosis not present

## 2020-04-03 DIAGNOSIS — K922 Gastrointestinal hemorrhage, unspecified: Secondary | ICD-10-CM | POA: Diagnosis not present

## 2020-04-03 MED ORDER — IOHEXOL 300 MG/ML  SOLN
100.0000 mL | Freq: Once | INTRAMUSCULAR | Status: AC | PRN
  Administered 2020-04-03: 100 mL via INTRAVENOUS

## 2020-04-04 LAB — SARS CORONAVIRUS 2 (TAT 6-24 HRS): SARS Coronavirus 2: NEGATIVE

## 2020-04-06 ENCOUNTER — Telehealth: Payer: Self-pay | Admitting: *Deleted

## 2020-04-06 NOTE — Telephone Encounter (Signed)
Called pt. She was agreeable to moving procedure time up for tomorrow. Moved to 11:15, aware to arrive at 9:45am. She can have clear liquids UNTIL midnight and then NPO.

## 2020-04-07 ENCOUNTER — Ambulatory Visit (HOSPITAL_COMMUNITY)
Admission: RE | Admit: 2020-04-07 | Discharge: 2020-04-07 | Disposition: A | Discharge status: Home / Self Care | Payer: Medicare PPO | Attending: Internal Medicine | Admitting: Internal Medicine

## 2020-04-07 ENCOUNTER — Encounter (HOSPITAL_COMMUNITY)
Admission: RE | Disposition: A | Discharge status: Home / Self Care | Payer: Self-pay | Source: Home / Self Care | Attending: Internal Medicine

## 2020-04-07 ENCOUNTER — Encounter (HOSPITAL_COMMUNITY): Payer: Self-pay

## 2020-04-07 ENCOUNTER — Ambulatory Visit (HOSPITAL_COMMUNITY): Payer: Medicare PPO | Admitting: Anesthesiology

## 2020-04-07 ENCOUNTER — Other Ambulatory Visit: Payer: Self-pay

## 2020-04-07 DIAGNOSIS — D120 Benign neoplasm of cecum: Secondary | ICD-10-CM

## 2020-04-07 DIAGNOSIS — Z85828 Personal history of other malignant neoplasm of skin: Secondary | ICD-10-CM | POA: Diagnosis not present

## 2020-04-07 DIAGNOSIS — Z79899 Other long term (current) drug therapy: Secondary | ICD-10-CM | POA: Diagnosis not present

## 2020-04-07 DIAGNOSIS — Z8249 Family history of ischemic heart disease and other diseases of the circulatory system: Secondary | ICD-10-CM | POA: Insufficient documentation

## 2020-04-07 DIAGNOSIS — J449 Chronic obstructive pulmonary disease, unspecified: Secondary | ICD-10-CM | POA: Diagnosis not present

## 2020-04-07 DIAGNOSIS — E119 Type 2 diabetes mellitus without complications: Secondary | ICD-10-CM | POA: Diagnosis not present

## 2020-04-07 DIAGNOSIS — K5730 Diverticulosis of large intestine without perforation or abscess without bleeding: Secondary | ICD-10-CM | POA: Diagnosis not present

## 2020-04-07 DIAGNOSIS — Z7984 Long term (current) use of oral hypoglycemic drugs: Secondary | ICD-10-CM | POA: Diagnosis not present

## 2020-04-07 DIAGNOSIS — K6289 Other specified diseases of anus and rectum: Secondary | ICD-10-CM

## 2020-04-07 DIAGNOSIS — D509 Iron deficiency anemia, unspecified: Secondary | ICD-10-CM | POA: Insufficient documentation

## 2020-04-07 DIAGNOSIS — Z87891 Personal history of nicotine dependence: Secondary | ICD-10-CM | POA: Diagnosis not present

## 2020-04-07 DIAGNOSIS — Z6841 Body Mass Index (BMI) 40.0 and over, adult: Secondary | ICD-10-CM | POA: Diagnosis not present

## 2020-04-07 DIAGNOSIS — Z9889 Other specified postprocedural states: Secondary | ICD-10-CM | POA: Diagnosis not present

## 2020-04-07 DIAGNOSIS — K635 Polyp of colon: Secondary | ICD-10-CM | POA: Diagnosis not present

## 2020-04-07 DIAGNOSIS — Z833 Family history of diabetes mellitus: Secondary | ICD-10-CM | POA: Diagnosis not present

## 2020-04-07 DIAGNOSIS — K648 Other hemorrhoids: Secondary | ICD-10-CM | POA: Insufficient documentation

## 2020-04-07 DIAGNOSIS — Z800 Family history of malignant neoplasm of digestive organs: Secondary | ICD-10-CM | POA: Insufficient documentation

## 2020-04-07 DIAGNOSIS — I509 Heart failure, unspecified: Secondary | ICD-10-CM | POA: Diagnosis not present

## 2020-04-07 DIAGNOSIS — I110 Hypertensive heart disease with heart failure: Secondary | ICD-10-CM | POA: Diagnosis not present

## 2020-04-07 DIAGNOSIS — D122 Benign neoplasm of ascending colon: Secondary | ICD-10-CM

## 2020-04-07 DIAGNOSIS — Z836 Family history of other diseases of the respiratory system: Secondary | ICD-10-CM | POA: Diagnosis not present

## 2020-04-07 DIAGNOSIS — Z881 Allergy status to other antibiotic agents status: Secondary | ICD-10-CM | POA: Insufficient documentation

## 2020-04-07 DIAGNOSIS — K6389 Other specified diseases of intestine: Secondary | ICD-10-CM | POA: Diagnosis not present

## 2020-04-07 HISTORY — PX: POLYPECTOMY: SHX149

## 2020-04-07 HISTORY — PX: COLONOSCOPY WITH PROPOFOL: SHX5780

## 2020-04-07 HISTORY — PX: BIOPSY: SHX5522

## 2020-04-07 LAB — GLUCOSE, CAPILLARY
Glucose-Capillary: 126 mg/dL — ABNORMAL HIGH (ref 70–99)
Glucose-Capillary: 113 mg/dL — ABNORMAL HIGH (ref 70–99)

## 2020-04-07 SURGERY — COLONOSCOPY WITH PROPOFOL
Anesthesia: General

## 2020-04-07 MED ORDER — PANTOPRAZOLE SODIUM 40 MG PO TBEC: 40.0000 mg | DELAYED_RELEASE_TABLET | Freq: Every day | ORAL | 5 refills | Status: DC

## 2020-04-07 MED ORDER — PROPOFOL 500 MG/50ML IV EMUL
INTRAVENOUS | Status: DC | PRN
  Administered 2020-04-07: 150 ug/kg/min via INTRAVENOUS

## 2020-04-07 MED ORDER — LACTATED RINGERS IV SOLN: Freq: Once | INTRAVENOUS | Status: AC

## 2020-04-07 MED ORDER — LACTATED RINGERS IV SOLN: INTRAVENOUS | Status: DC | PRN

## 2020-04-07 MED ORDER — PROPOFOL 10 MG/ML IV BOLUS
INTRAVENOUS | Status: DC | PRN
  Administered 2020-04-07: 50 mg via INTRAVENOUS
  Administered 2020-04-07: 30 mg via INTRAVENOUS
  Administered 2020-04-07: 20 mg via INTRAVENOUS
  Administered 2020-04-07: 50 mg via INTRAVENOUS
  Administered 2020-04-07 (×2): 10 mg via INTRAVENOUS
  Administered 2020-04-07: 50 mg via INTRAVENOUS
  Administered 2020-04-07: 30 mg via INTRAVENOUS
  Administered 2020-04-07 (×2): 10 mg via INTRAVENOUS
  Administered 2020-04-07: 50 mg via INTRAVENOUS
  Administered 2020-04-07: 70 mg via INTRAVENOUS

## 2020-04-07 NOTE — Interval H&P Note (Signed)
History and Physical Interval Note:  04/07/2020 10:24 AM  Savannah Burns  has presented today for surgery, with the diagnosis of iron deficiency anemia.  The various methods of treatment have been discussed with the patient and family. After consideration of risks, benefits and other options for treatment, the patient has consented to  Procedure(s) with comments: COLONOSCOPY WITH PROPOFOL (N/A) - 3:00pm, pt knows new time per office as a surgical intervention.  The patient's history has been reviewed, patient examined, no change in status, stable for surgery.  I have reviewed the patient's chart and labs.  Questions were answered to the patient's satisfaction.     Eloise Harman

## 2020-04-07 NOTE — Op Note (Signed)
[facility] Patient Name: [facility] Procedure Date: 04/07/2020 10:22 AM MRN: [id number] Date of Birth: 11-13-1950 Attending MD: Elon Alas. Edgar Frisk CSN: [id number] Age: 69 Admit Type: Outpatient Procedure:                Colonoscopy Indications:              Iron deficiency anemia Providers:                Elon Alas. Abbey Chatters, DO, Gwenlyn Fudge, RN, Dereck Leep, Technician, Nelma Rothman, Technician Referring MD:              Medicines:                See the Anesthesia note for documentation of the                            administered medications Complications:            No immediate complications. Estimated Blood Loss:     Estimated blood loss was minimal. Procedure:                Pre-Anesthesia Assessment:                           - The anesthesia plan was to use monitored                            anesthesia care (MAC).                           After obtaining informed consent, the colonoscope                            was passed under direct vision. Throughout the                            procedure, the patient's blood pressure, pulse, and                            oxygen saturations were monitored continuously. The                            PCF-[id number] ([id number]) was introduced through the                            anus and advanced to the the cecum, identified by                            appendiceal orifice and ileocecal valve. The                            colonoscopy was performed without difficulty. The                            patient tolerated the procedure  well. The quality                            of the bowel preparation was evaluated using the                            BBPS [facility] Bowel Preparation Scale) with scores                            of: Right Colon = 2 (minor amount of residual                            staining, small fragments of stool and/or opaque                            liquid, but  mucosa seen well), Transverse Colon = 2                            (minor amount of residual staining, small fragments                            of stool and/or opaque liquid, but mucosa seen                            well) and Left Colon = 2 (minor amount of residual                            staining, small fragments of stool and/or opaque                            liquid, but mucosa seen well). The total BBPS score                            equals 6. The quality of the bowel preparation was                            fair. Scope In: 10:39:17 AM Scope Out: 11:22:03 AM Scope Withdrawal Time: 0 hours 38 minutes 48 seconds  Total Procedure Duration: 0 hours 42 minutes 46 seconds  Findings:      The perianal and digital rectal examinations were normal.      Non-bleeding internal hemorrhoids were found during endoscopy.      Multiple small and large-mouthed diverticula were found in the sigmoid       colon and descending colon.      A 10 mm polyp was found in the cecum. The polyp was semi-sessile and       umbilicated. Preparations were made for mucosal resection. Orise gel was       injected with adequate lift of the lesion from the muscularis propria.       Snare mucosal resection was performed. A 12 mm area was resected.       Resection and retrieval were complete. There was no bleeding at the end       of the procedure.      A  7 mm polyp was found in the ascending colon. The polyp was sessile.       The polyp was removed with a hot snare. Resection and retrieval were       complete.      A 18 mm polyp was found in the hepatic flexure. The polyp was       semi-pedunculated. The polyp was removed with a hot snare. Resection and       retrieval were complete.      A 30 mm post polypectomy scar was found in the transverse colon. There       was residual polypoid tissue. Biopsies were taken with a cold forceps       for histology. Impression:               - Preparation of the colon  was fair.                           - Non-bleeding internal hemorrhoids.                           - Diverticulosis in the sigmoid colon and in the                            descending colon.                           - One 10 mm polyp in the cecum, removed with                            mucosal resection. Resected and retrieved.                           - One 7 mm polyp in the ascending colon, removed                            with a hot snare. Resected and retrieved.                           - One 18 mm polyp at the hepatic flexure, removed                            with a hot snare. Resected and retrieved.                           - Post-polypectomy scar in the transverse colon.                            Biopsied.                           - Mucosal resection was performed. Resection and                            retrieval were complete. Moderate Sedation:      Per Anesthesia Care Recommendation:           - Patient has a contact  number available for                            emergencies. The signs and symptoms of potential                            delayed complications were discussed with the                            patient. Return to normal activities tomorrow.                            Written discharge instructions were provided to the                            patient.                           - Resume previous diet.                           - Continue present medications.                           - Await pathology results.                           - Repeat colonoscopy date to be determined after                            pending pathology results are reviewed for                            surveillance.                           - Return to GI clinic in 2 months. Procedure Code(s):        --- Professional ---                           [phone], Colonoscopy, flexible; with endoscopic                            mucosal resection                            45385, 79, Colonoscopy, flexible; with removal of                            tumor(s), polyp(s), or other lesion(s) by snare                            technique                           45380, 52, Colonoscopy, flexible; with biopsy,  single or multiple Diagnosis Code(s):        --- Professional ---                           K64.8, Other hemorrhoids                           K63.5, Polyp of colon                           Z98.890, Other specified postprocedural states                           D50.9, Iron deficiency anemia, unspecified                           K57.30, Diverticulosis of large intestine without                            perforation or abscess without bleeding CPT copyright 2019 American Medical Association. All rights reserved. The codes documented in this report are preliminary and upon coder review may  be revised to meet current compliance requirements. Elon Alas. Abbey Chatters, DO [place] Abbey Chatters, DO 04/07/2020 11:29:12 AM This report has been signed electronically. Number of Addenda: 0

## 2020-04-07 NOTE — Discharge Instructions (Signed)
°  Colonoscopy Discharge Instructions  Read the instructions outlined below and refer to this sheet in the next few weeks. These discharge instructions provide you with general information on caring for yourself after you leave the hospital. Your doctor may also give you specific instructions. While your treatment has been planned according to the most current medical practices available, unavoidable complications occasionally occur.   ACTIVITY  You may resume your regular activity, but move at a slower pace for the next 24 hours.   Take frequent rest periods for the next 24 hours.   Walking will help get rid of the air and reduce the bloated feeling in your belly (abdomen).   No driving for 24 hours (because of the medicine (anesthesia) used during the test).    Do not sign any important legal documents or operate any machinery for 24 hours (because of the anesthesia used during the test).  NUTRITION  Drink plenty of fluids.   You may resume your normal diet as instructed by your doctor.   Begin with a light meal and progress to your normal diet. Heavy or fried foods are harder to digest and may make you feel sick to your stomach (nauseated).   Avoid alcoholic beverages for 24 hours or as instructed.  MEDICATIONS  You may resume your normal medications unless your doctor tells you otherwise.  WHAT YOU CAN EXPECT TODAY  Some feelings of bloating in the abdomen.   Passage of more gas than usual.   Spotting of blood in your stool or on the toilet paper.  IF YOU HAD POLYPS REMOVED DURING THE COLONOSCOPY:  No aspirin products for 7 days or as instructed.   No alcohol for 7 days or as instructed.   Eat a soft diet for the next 24 hours.  FINDING OUT THE RESULTS OF YOUR TEST Not all test results are available during your visit. If your test results are not back during the visit, make an appointment with your caregiver to find out the results. Do not assume everything is normal if  you have not heard from your caregiver or the medical facility. It is important for you to follow up on all of your test results.  SEEK IMMEDIATE MEDICAL ATTENTION IF:  You have more than a spotting of blood in your stool.   Your belly is swollen (abdominal distention).   You are nauseated or vomiting.   You have a temperature over 101.   You have abdominal pain or discomfort that is severe or gets worse throughout the day.   Your colonoscopy revealed 2 more large polyps on the right side which I removed successfully.  The post polypectomy site from previous colonoscopy was evaluated thoroughly.  Appears there may be some residual polyp tissue remaining.  I biopsied this today.  Based on biopsy results will determine our next course of action.  Follow-up with me in 2 to 3 months.  I will be in touch after pathology results reviewed  I hope you have a great rest of your week!  Elon Alas. Abbey Chatters, D.O. Gastroenterology and Hepatology [facility]

## 2020-04-07 NOTE — Anesthesia Preprocedure Evaluation (Addendum)
Anesthesia Evaluation  Patient identified by MRN, date of birth, ID band Patient awake    Reviewed: Allergy & Precautions, NPO status , Patient's Chart, lab work & pertinent test results  History of Anesthesia Complications Negative for: history of anesthetic complications  Airway Mallampati: III  TM Distance: >3 FB Neck ROM: Full    Dental  (+) Dental Advisory Given, Upper Dentures, Missing   Pulmonary shortness of breath, with exertion and Long-Term Oxygen Therapy, pneumonia, resolved, COPD,  oxygen dependent, former smoker,    Pulmonary exam normal breath sounds clear to auscultation       Cardiovascular Exercise Tolerance: Poor hypertension, Pt. on medications +CHF  Normal cardiovascular exam Rhythm:Regular Rate:Normal  Echo shows some mild stiffness to the heart which is common with aging but can cause some swelling/fluid to build up. One of her heart valves (aortic valve) is mildly thicker than normal but overall working well, this creates her heart murmur and is not considered to be a significant issue. Will discuss in more detail at f/u   Neuro/Psych PSYCHIATRIC DISORDERS Anxiety Depression    GI/Hepatic GERD  Medicated,(+) Cirrhosis   Esophageal Varices    ,   Endo/Other  diabetes, Well Controlled, Type 2, Oral Hypoglycemic AgentsMorbid obesity  Renal/GU      Musculoskeletal  (+) Arthritis , narcotic dependent  Abdominal   Peds  Hematology  (+) anemia ,   Anesthesia Other Findings   Reproductive/Obstetrics                           Anesthesia Physical Anesthesia Plan  ASA: IV  Anesthesia Plan: General   Post-op Pain Management:    Induction: Intravenous  PONV Risk Score and Plan: TIVA  Airway Management Planned: Nasal Cannula, Natural Airway and Simple Face Mask  Additional Equipment:   Intra-op Plan:   Post-operative Plan:   Informed Consent: I have reviewed the  patients History and Physical, chart, labs and discussed the procedure including the risks, benefits and alternatives for the proposed anesthesia with the patient or authorized representative who has indicated his/her understanding and acceptance.     Dental advisory given  Plan Discussed with: CRNA and Surgeon  Anesthesia Plan Comments:        Anesthesia Quick Evaluation

## 2020-04-07 NOTE — Anesthesia Postprocedure Evaluation (Signed)
Anesthesia Post Note  Patient: Savannah Burns  Procedure(s) Performed: COLONOSCOPY WITH PROPOFOL (N/A ) POLYPECTOMY INTESTINAL BIOPSY  Patient location during evaluation: PACU Anesthesia Type: General Level of consciousness: awake and alert and oriented Pain management: pain level controlled Vital Signs Assessment: post-procedure vital signs reviewed and stable Respiratory status: spontaneous breathing and patient connected to nasal cannula oxygen Cardiovascular status: blood pressure returned to baseline and stable Postop Assessment: no apparent nausea or vomiting Anesthetic complications: no   No complications documented.   Last Vitals:  Vitals:   04/07/20 1003 04/07/20 1130  BP:  112/72  Pulse: 82 88  Resp: (!) 24 18  Temp: 37.4 C   SpO2: 100% 98%    Last Pain:  Vitals:   04/07/20 1037  TempSrc:   PainSc: 0-No pain                 Cormick Moss

## 2020-04-07 NOTE — Transfer of Care (Signed)
Immediate Anesthesia Transfer of Care Note  Patient: Savannah Burns  Procedure(s) Performed: COLONOSCOPY WITH PROPOFOL (N/A ) POLYPECTOMY INTESTINAL BIOPSY  Patient Location: PACU  Anesthesia Type:General  Level of Consciousness: awake  Airway & Oxygen Therapy: Patient Spontanous Breathing and Patient connected to nasal cannula oxygen  Post-op Assessment: Report given to RN  Post vital signs: Reviewed and stable  Last Vitals:  Vitals Value Taken Time  BP 112/72 04/07/20 1131  Temp    Pulse 86 04/07/20 1132  Resp 16 04/07/20 1132  SpO2 99 % 04/07/20 1132  Vitals shown include unvalidated device data.  Last Pain:  Vitals:   04/07/20 1037  TempSrc:   PainSc: 0-No pain         Complications: No complications documented.

## 2020-04-08 LAB — SURGICAL PATHOLOGY

## 2020-04-15 ENCOUNTER — Ambulatory Visit: Payer: Medicare PPO | Admitting: Family Medicine

## 2020-04-16 ENCOUNTER — Encounter (HOSPITAL_COMMUNITY): Payer: Self-pay | Admitting: Internal Medicine

## 2020-04-24 ENCOUNTER — Telehealth: Payer: Self-pay

## 2020-04-24 ENCOUNTER — Ambulatory Visit: Payer: Medicare PPO | Admitting: Licensed Clinical Social Worker

## 2020-04-24 DIAGNOSIS — E785 Hyperlipidemia, unspecified: Secondary | ICD-10-CM

## 2020-04-24 DIAGNOSIS — J449 Chronic obstructive pulmonary disease, unspecified: Secondary | ICD-10-CM

## 2020-04-24 DIAGNOSIS — E119 Type 2 diabetes mellitus without complications: Secondary | ICD-10-CM

## 2020-04-24 DIAGNOSIS — E1169 Type 2 diabetes mellitus with other specified complication: Secondary | ICD-10-CM

## 2020-04-24 DIAGNOSIS — I10 Essential (primary) hypertension: Secondary | ICD-10-CM

## 2020-04-24 DIAGNOSIS — F339 Major depressive disorder, recurrent, unspecified: Secondary | ICD-10-CM

## 2020-04-24 MED ORDER — POTASSIUM CHLORIDE CRYS ER 20 MEQ PO TBCR: 40.0000 meq | EXTENDED_RELEASE_TABLET | Freq: Two times a day (BID) | ORAL | 3 refills | Status: DC

## 2020-04-24 NOTE — Patient Instructions (Addendum)
Licensed Clinical Social Worker Visit Information  Goals we discussed today:  .  Client will talk with LCSW in next 30 days to discuss depression issues of client and manaement of depression issues (pt-stated)        CARE PLAN ENTRY   Current Barriers:  Patient with chronic diagnoses of Depression, Obesity, DM, HTN, COPD, DJD, HLD  Clinical Social Work Clinical Goal(s):   LCSW will call client in next 30 days to discuss depression issues of client and client management of depression issues  Interventions:  Talked with client about her recent hospital stay Talked with client about her colonoscopies (one in October 2021 and one about 3 weeks ago) Talked with client about her medication procurement Talked with client about financial needs of client Talked with client about sleeping issues of client Talked with client about mobility of client Talked with client about ADLs completion of client (she said she cannot step anymore into bathtub) Talked with client about meal provision for client Talked with client about DME (has a walker with a seat, 3 in 1 bedside commode,oxygen system) Talked with client about breathing issues and oxygen level check for patient Talked with client about [place] support Talked with client about mood of client Talked with client about edema issues Talked with client about pain issues of client Talked with client about her upcoming medical appointments Provided counseling support for client Talked with client about support from granddaughter (granddaughter is living with client and providing support for client) Talked with client about difficulty in standing Encouraged client to call [facility] Triage Nurse as needed to discuss nursing needs of client  Patient Self Care Activities:   Eats meals with set up assistance   Patient Self Care Deficits:  Shortness of breath (breathing challenges, uses oxygen as needed) Mobility issues Transport  challenges  Initial goal documentation     Follow Up Plan:LCSW to call client/daughter of clientin next 4 weeks to talk withclient/daughterabout depression issues of client and to talkabout clientmanagement of depression issues faced  Materials Provided: No  The patient verbalized understanding of instructions provided today and declined a print copy of patient instruction materials.   Savannah Burns.Savannah Burns MSW, LCSW Licensed Clinical Social Worker [facility] Care Management [phone]

## 2020-04-24 NOTE — Telephone Encounter (Signed)
Pt throw away her potassium chloride SA (KLOR-CON) 20 MEQ tablet 2 weeks ago by mistake. She wants to know what to do? Can a refill be called in--use [facility] [place] Please call back

## 2020-04-24 NOTE — Chronic Care Management (AMB) (Signed)
Chronic Care Management    Clinical Social Work Follow Up Note  04/24/2020 Name: Savannah Burns MRN: [id number] DOB: 27-Jul-1950  Savannah Burns is a 70 y.o. year old female who is a primary care patient of [name], DO. The CCM team was consulted for assistance with [employer] .   Review of patient status, including review of consultants reports, other relevant assessments, and collaboration with appropriate care team members and the patient's provider was performed as part of comprehensive patient evaluation and provision of chronic care management services.    SDOH (Social Determinants of Health) assessments performed: No; risk for depression; risk for tobacco use; risk for physical inactivity; risk for financial strain  Flowsheet Row Chronic Care Management from 12/05/2019 in [place]  PHQ-9 Total Score 6     GAD 7 : Generalized Anxiety Score 12/05/2019 12/22/2017  Nervous, Anxious, on Edge 1 1  Control/stop worrying 1 3  Worry too much - different things 1 1  Trouble relaxing 0 1  Restless 0 0  Easily annoyed or irritable 1 1  Afraid - awful might happen 0 3  Total GAD 7 Score 4 10  Anxiety Difficulty Somewhat difficult Somewhat difficult    Outpatient Encounter Medications as of 04/24/2020  Medication Sig Note  . Blood Glucose Calibration (GLUCOMETER DEX HIGH CONTROL) LIQD Test CBG's once daily.    . Blood Glucose Monitoring Suppl (TRUE METRIX AIR GLUCOSE METER) w/Device KIT Check BS daily Dx E11.9   . Cholecalciferol (VITAMIN D) 50 MCG (2000 UT) tablet Take 4,000 Units by mouth daily.   Marland Kitchen escitalopram (LEXAPRO) 20 MG tablet Take 1 tablet (20 mg total) by mouth daily. (Patient not taking: Reported on 04/01/2020)   . ferrous sulfate 325 (65 FE) MG tablet Take 1 tablet (325 mg total) by mouth 2 (two) times daily with a meal.   . glucose blood (TRUE METRIX BLOOD GLUCOSE TEST) test strip Check BS daily Dx E11.9   . lisinopril  (ZESTRIL) 10 MG tablet Take 1 tablet by mouth once daily (Patient taking differently: Take 10 mg by mouth daily.)   . lovastatin (MEVACOR) 20 MG tablet Take 1 tablet (20 mg total) by mouth at bedtime.   . metFORMIN (GLUCOPHAGE) 1000 MG tablet Take 1 tablet (1,000 mg total) by mouth 2 (two) times daily with a meal.   . oxyCODONE-acetaminophen (PERCOCET) 10-325 MG tablet Take 1 tablet by mouth every 6 (six) hours as needed for pain.   Marland Kitchen oxymetazoline (AFRIN) 0.05 % nasal spray Place 1 spray into both nostrils 2 (two) times daily as needed for congestion.   . pantoprazole (PROTONIX) 40 MG tablet Take 1 tablet (40 mg total) by mouth daily.   . potassium chloride SA (KLOR-CON) 20 MEQ tablet Take 2 tablets (40 mEq total) by mouth 2 (two) times daily. 04/01/2020: Pt ran out, needs to get more  . torsemide (DEMADEX) 20 MG tablet Take 3 tablets (60 mg total) by mouth 2 (two) times daily. (Patient taking differently: Take 40 mg by mouth 2 (two) times daily.)   . TRUEplus Lancets 30G MISC Check BS daily Dx E11.9   . [DISCONTINUED] albuterol (PROVENTIL HFA;VENTOLIN HFA) 108 (90 Base) MCG/ACT inhaler Inhale 2 puffs into the lungs every 6 (six) hours as needed for wheezing or shortness of breath.    No facility-administered encounter medications on file as of 04/24/2020.    Goals    .  Client will talk with LCSW in next 30 days  to discuss depression issues of client and manaement of depression issues (pt-stated)      CARE PLAN ENTRY   Current Barriers:  Patient with chronic diagnoses of Depression, Obesity, DM, HTN, COPD, DJD, HLD  Clinical Social Work Clinical Goal(s):  Marland Kitchen LCSW will call client in next 30 days to discuss depression issues of client and client management of depression issues  Interventions:  Talked with client about her recent hospital stay Talked with client about her colonoscopies (one in October 2021 and one about 3 weeks ago) Talked with client about her medication procurement Talked  with client about financial needs of client Talked with client about sleeping issues of client Talked with client about mobility of client Talked with client about ADLs completion of client (she said she cannot step anymore into bathtub) Talked with client about meal provision for client Talked with client about DME (has a walker with a seat, 3 in 1 bedside commode,oxygen system) Talked with client about breathing issues and oxygen level check for patient Talked with client about [place] support Talked with client about mood of client Talked with client about edema issues Talked with client about pain issues of client Talked with client about her upcoming medical appointments Provided counseling support for client Talked with client about support from granddaughter (granddaughter is living with client and providing support for client) Talked with client about difficulty in standing Encouraged client to call [facility] Triage Nurse as needed to discuss nursing needs of client  Patient Self Care Activities:   Eats meals with set up assistance   Patient Self Care Deficits:  Shortness of breath (breathing challenges, uses oxygen as needed) Mobility issues Transport challenges  Initial goal documentation     Follow Up Plan: LCSW to call client/daughter of clientin next 4 weeks to talk withclient/daughterabout depression issues of client and to talkabout clientmanagement of depression issues faced  [name].Renie Stelmach MSW, LCSW Licensed Clinical Social Worker [facility] Care Management [phone]

## 2020-04-24 NOTE — Telephone Encounter (Signed)
Refill sent to pharmacy.  Patient aware.

## 2020-04-25 DIAGNOSIS — J449 Chronic obstructive pulmonary disease, unspecified: Secondary | ICD-10-CM | POA: Diagnosis not present

## 2020-05-01 DIAGNOSIS — Z79899 Other long term (current) drug therapy: Secondary | ICD-10-CM | POA: Diagnosis not present

## 2020-05-01 DIAGNOSIS — M545 Low back pain, unspecified: Secondary | ICD-10-CM | POA: Diagnosis not present

## 2020-05-01 DIAGNOSIS — F1120 Opioid dependence, uncomplicated: Secondary | ICD-10-CM | POA: Diagnosis not present

## 2020-05-01 DIAGNOSIS — G894 Chronic pain syndrome: Secondary | ICD-10-CM | POA: Diagnosis not present

## 2020-05-01 DIAGNOSIS — G8929 Other chronic pain: Secondary | ICD-10-CM | POA: Diagnosis not present

## 2020-05-05 ENCOUNTER — Telehealth: Payer: Medicare PPO | Admitting: *Deleted

## 2020-05-05 ENCOUNTER — Telehealth: Payer: Self-pay | Admitting: *Deleted

## 2020-05-05 NOTE — Telephone Encounter (Signed)
  Chronic Care Management   Outreach Note  05/05/2020 Name: Savannah Burns MRN: [id number] DOB: 12-25-1950  Referred by: [name], DO Reason for referral : Chronic Care Management (RN Follow up)   A first unsuccessful follow-up Telephone Visit was attempted today. The patient was referred to the case management team for assistance with care management and care coordination.   Clinical Goals: . Over the next 30 days, patient will be contacted by a Care Guide to reschedule their CCM Visit  Interventions and Plan . Chart reviewed in preparation for telephone visit . Collaboration with other care team members as needed . A HIPAA compliant phone message was left for the patient providing contact information and requesting a return call.  . Request sent to care guides to reach out and reschedule patient's telephone visit   [name], BSN, RN-BC [facility] Direct Dial: [phone]

## 2020-05-06 NOTE — Telephone Encounter (Signed)
R/s

## 2020-05-06 NOTE — Telephone Encounter (Signed)
Sorry disregard sent in error

## 2020-05-11 NOTE — Telephone Encounter (Signed)
Rescheduled for 05/25/2020

## 2020-05-13 ENCOUNTER — Telehealth: Payer: Self-pay

## 2020-05-13 NOTE — Telephone Encounter (Signed)
Pt called to schedule an appt to see Dr Lajuana Ripple regarding her oxygen level.  Says here lately she has been having to use more oxygen than normal and would like Dr Lajuana Ripple to put in a lab order for her to have her hemaglobin checked since she doesn't have any openings until the end of March.  Please advise and call patient.

## 2020-05-15 ENCOUNTER — Other Ambulatory Visit: Payer: Self-pay

## 2020-05-15 ENCOUNTER — Other Ambulatory Visit: Payer: Medicare PPO

## 2020-05-15 DIAGNOSIS — D500 Iron deficiency anemia secondary to blood loss (chronic): Secondary | ICD-10-CM

## 2020-05-15 NOTE — Telephone Encounter (Signed)
Placed order for future hemoglobin and left detailed message on patients voicemail with instructions to stop by and have labs done.

## 2020-05-15 NOTE — Telephone Encounter (Signed)
Ok to place fingerstick hgb order

## 2020-05-15 NOTE — Addendum Note (Signed)
Addended by: Rolena Infante on: 05/15/2020 01:25 PM   Modules accepted: Orders

## 2020-05-16 ENCOUNTER — Observation Stay (HOSPITAL_COMMUNITY)
Admission: EM | Admit: 2020-05-16 | Discharge: 2020-05-19 | Disposition: A | Discharge status: Home / Self Care | Payer: Medicare PPO | Attending: Family Medicine | Admitting: Family Medicine

## 2020-05-16 ENCOUNTER — Other Ambulatory Visit: Payer: Self-pay

## 2020-05-16 ENCOUNTER — Encounter (HOSPITAL_COMMUNITY): Payer: Self-pay

## 2020-05-16 DIAGNOSIS — E669 Obesity, unspecified: Secondary | ICD-10-CM | POA: Diagnosis present

## 2020-05-16 DIAGNOSIS — I110 Hypertensive heart disease with heart failure: Secondary | ICD-10-CM | POA: Insufficient documentation

## 2020-05-16 DIAGNOSIS — D509 Iron deficiency anemia, unspecified: Secondary | ICD-10-CM

## 2020-05-16 DIAGNOSIS — Z20822 Contact with and (suspected) exposure to covid-19: Secondary | ICD-10-CM | POA: Diagnosis not present

## 2020-05-16 DIAGNOSIS — D62 Acute posthemorrhagic anemia: Secondary | ICD-10-CM | POA: Diagnosis present

## 2020-05-16 DIAGNOSIS — Z7984 Long term (current) use of oral hypoglycemic drugs: Secondary | ICD-10-CM | POA: Insufficient documentation

## 2020-05-16 DIAGNOSIS — E876 Hypokalemia: Secondary | ICD-10-CM

## 2020-05-16 DIAGNOSIS — Z6841 Body Mass Index (BMI) 40.0 and over, adult: Secondary | ICD-10-CM | POA: Diagnosis not present

## 2020-05-16 DIAGNOSIS — K921 Melena: Principal | ICD-10-CM | POA: Diagnosis present

## 2020-05-16 DIAGNOSIS — J449 Chronic obstructive pulmonary disease, unspecified: Secondary | ICD-10-CM | POA: Diagnosis not present

## 2020-05-16 DIAGNOSIS — R770 Abnormality of albumin: Secondary | ICD-10-CM | POA: Diagnosis not present

## 2020-05-16 DIAGNOSIS — I5032 Chronic diastolic (congestive) heart failure: Secondary | ICD-10-CM | POA: Diagnosis present

## 2020-05-16 DIAGNOSIS — E119 Type 2 diabetes mellitus without complications: Secondary | ICD-10-CM

## 2020-05-16 DIAGNOSIS — E785 Hyperlipidemia, unspecified: Secondary | ICD-10-CM | POA: Diagnosis not present

## 2020-05-16 DIAGNOSIS — E66813 Obesity, class 3: Secondary | ICD-10-CM | POA: Diagnosis present

## 2020-05-16 DIAGNOSIS — K7581 Nonalcoholic steatohepatitis (NASH): Secondary | ICD-10-CM | POA: Insufficient documentation

## 2020-05-16 DIAGNOSIS — E1159 Type 2 diabetes mellitus with other circulatory complications: Secondary | ICD-10-CM | POA: Diagnosis present

## 2020-05-16 DIAGNOSIS — E440 Moderate protein-calorie malnutrition: Secondary | ICD-10-CM

## 2020-05-16 DIAGNOSIS — Z87891 Personal history of nicotine dependence: Secondary | ICD-10-CM | POA: Diagnosis not present

## 2020-05-16 DIAGNOSIS — Z79899 Other long term (current) drug therapy: Secondary | ICD-10-CM | POA: Insufficient documentation

## 2020-05-16 DIAGNOSIS — E8809 Other disorders of plasma-protein metabolism, not elsewhere classified: Secondary | ICD-10-CM

## 2020-05-16 DIAGNOSIS — R0600 Dyspnea, unspecified: Secondary | ICD-10-CM

## 2020-05-16 DIAGNOSIS — D649 Anemia, unspecified: Secondary | ICD-10-CM | POA: Diagnosis not present

## 2020-05-16 DIAGNOSIS — F339 Major depressive disorder, recurrent, unspecified: Secondary | ICD-10-CM | POA: Diagnosis present

## 2020-05-16 DIAGNOSIS — I152 Hypertension secondary to endocrine disorders: Secondary | ICD-10-CM | POA: Diagnosis present

## 2020-05-16 DIAGNOSIS — R799 Abnormal finding of blood chemistry, unspecified: Secondary | ICD-10-CM | POA: Diagnosis present

## 2020-05-16 LAB — COMPREHENSIVE METABOLIC PANEL
ALT: 14 U/L (ref 0–44)
AST: 19 U/L (ref 15–41)
Albumin: 2.8 g/dL — ABNORMAL LOW (ref 3.5–5.0)
Alkaline Phosphatase: 83 U/L (ref 38–126)
Anion gap: 6 (ref 5–15)
BUN: 10 mg/dL (ref 8–23)
CO2: 30 mmol/L (ref 22–32)
Calcium: 8.6 mg/dL — ABNORMAL LOW (ref 8.9–10.3)
Chloride: 101 mmol/L (ref 98–111)
Creatinine, Ser: 0.57 mg/dL (ref 0.44–1.00)
GFR, Estimated: >60 mL/min (ref >60)
Glucose, Bld: 119 mg/dL — ABNORMAL HIGH (ref 70–99)
Potassium: 3.4 mmol/L — ABNORMAL LOW (ref 3.5–5.1)
Sodium: 137 mmol/L (ref 135–145)
Total Bilirubin: 0.3 mg/dL (ref 0.3–1.2)
Total Protein: 7.7 g/dL (ref 6.5–8.1)

## 2020-05-16 LAB — POC OCCULT BLOOD, ED: 11800305105: POSITIVE

## 2020-05-16 LAB — CBC
HCT: 24.7 % — ABNORMAL LOW (ref 36.0–46.0)
HCT: 26.1 % — ABNORMAL LOW (ref 36.0–46.0)
Hemoglobin: 6.8 g/dL — CL (ref 12.0–15.0)
Hemoglobin: 7.4 g/dL — ABNORMAL LOW (ref 12.0–15.0)
MCH: 23.5 pg — ABNORMAL LOW (ref 26.0–34.0)
MCH: 24.3 pg — ABNORMAL LOW (ref 26.0–34.0)
MCHC: 27.5 g/dL — ABNORMAL LOW (ref 30.0–36.0)
MCHC: 28.4 g/dL — ABNORMAL LOW (ref 30.0–36.0)
MCV: 85.5 fL (ref 80.0–100.0)
MCV: 85.9 fL (ref 80.0–100.0)
Platelets: 133 10*3/uL — ABNORMAL LOW (ref 150–400)
Platelets: 117 10*3/uL — ABNORMAL LOW (ref 150–400)
RBC: 2.89 MIL/uL — ABNORMAL LOW (ref 3.87–5.11)
RBC: 3.04 MIL/uL — ABNORMAL LOW (ref 3.87–5.11)
RDW: 19.3 % — ABNORMAL HIGH (ref 11.5–15.5)
RDW: 18.8 % — ABNORMAL HIGH (ref 11.5–15.5)
WBC: 6.1 10*3/uL (ref 4.0–10.5)
WBC: 6.1 10*3/uL (ref 4.0–10.5)
nRBC: 0 % (ref 0.0–0.2)
nRBC: 0 % (ref 0.0–0.2)

## 2020-05-16 LAB — PREPARE RBC (CROSSMATCH)

## 2020-05-16 MED ORDER — OCTREOTIDE LOAD VIA INFUSION
100.0000 ug | Freq: Once | INTRAVENOUS | Status: AC
  Administered 2020-05-17: 100 ug via INTRAVENOUS
  Filled 2020-05-16: qty 50

## 2020-05-16 MED ORDER — SODIUM CHLORIDE 0.9 % IV SOLN
50.0000 ug/h | INTRAVENOUS | Status: DC
  Administered 2020-05-17 – 2020-05-19 (×6): 50 ug/h via INTRAVENOUS
  Filled 2020-05-16 (×9): qty 1

## 2020-05-16 MED ORDER — SODIUM CHLORIDE 0.9 % IV SOLN
10.0000 mL/h | Freq: Once | INTRAVENOUS | Status: AC
  Administered 2020-05-16: 10 mL/h via INTRAVENOUS

## 2020-05-16 MED ORDER — ACETAMINOPHEN 650 MG RE SUPP: 650.0000 mg | Freq: Four times a day (QID) | RECTAL | Status: DC | PRN

## 2020-05-16 MED ORDER — POTASSIUM CHLORIDE IN NACL 20-0.9 MEQ/L-% IV SOLN
INTRAVENOUS | Status: AC
  Filled 2020-05-16: qty 1000

## 2020-05-16 MED ORDER — ONDANSETRON HCL 4 MG PO TABS: 4.0000 mg | ORAL_TABLET | Freq: Four times a day (QID) | ORAL | Status: DC | PRN

## 2020-05-16 MED ORDER — PANTOPRAZOLE SODIUM 40 MG IV SOLR
40.0000 mg | Freq: Two times a day (BID) | INTRAVENOUS | Status: DC
  Administered 2020-05-17 – 2020-05-19 (×6): 40 mg via INTRAVENOUS
  Filled 2020-05-16 (×6): qty 40

## 2020-05-16 MED ORDER — ONDANSETRON HCL 4 MG/2ML IJ SOLN: 4.0000 mg | Freq: Four times a day (QID) | INTRAMUSCULAR | Status: DC | PRN

## 2020-05-16 MED ORDER — ACETAMINOPHEN 325 MG PO TABS
650.0000 mg | ORAL_TABLET | Freq: Four times a day (QID) | ORAL | Status: DC | PRN
  Administered 2020-05-18: 650 mg via ORAL
  Filled 2020-05-16: qty 2

## 2020-05-16 NOTE — ED Triage Notes (Signed)
Pt to er, pt states that she is here for a low hemoglobin, states that it was 6.1 yesterday.  States that she has a hx of polyps and possible gi bleed, pt states that she has had two colonoscopies with polyp removal, states that today she feels generally weak, pt states that she is normally on 4L O2 via [place] when she walks around, pt placed on 4L O2 via ,

## 2020-05-16 NOTE — ED Notes (Signed)
Pt to be admitted will complete vital signs accordingly.

## 2020-05-16 NOTE — ED Notes (Signed)
Pt co back pain from laying on ED stretcher "all day". Offered pt recliner chair, pt accepted.

## 2020-05-16 NOTE — ED Provider Notes (Signed)
[facility] EMERGENCY DEPARTMENT Provider Note   CSN: [id number] Arrival date & time: 05/16/20  1148     History Chief Complaint  Patient presents with  . Abnormal Lab    Savannah Burns is a 70 y.o. female.  HPI      Savannah Burns is a 70 y.o. female with past medical history of CHF, diabetes, hypertension and anemia who presents to the Emergency Department for evaluation of low hemoglobin.  She states that she was in her PCPs office yesterday for blood work and advised today that her hemoglobin was 6.1.  She was advised to come to the emergency department for further evaluation.  She complains of generalized fatigue, shortness of breath and malaise.  Symptoms have been present for several days to weeks.  She wears 4 L of oxygen continuously, but states that she feels her breath even while wearing her oxygen.  She is supposed to take iron supplements, but has not taken them in 3 to 4 days.  She also endorses taking ibuprofen recently but does not take it consistently.  She denies chest pain, abdominal pain, vomiting or diarrhea.  She states her stools have been loose and "smearing."  No melena.  Patient was admitted to the hospital in October of last year with hemoglobin of 3.8 received transfusion at that time and underwent EGD that showed esophageal varices and colonoscopy showed diverticulosis with multiple polyps including one large adenomatous polyp.  She had additional hospital admission in December with hemoglobin of 6.5 received another transfusion she underwent repeat colonoscopy in January that again showed several polyps and nonbleeding internal hemorrhoids   Past Medical History:  Diagnosis Date  . Adrenal adenoma, left    Stable  . Anxiety   . Arthritis    bilateral hands  . CHF (congestive heart failure) ([place])   . Depression   . Diabetes mellitus without complication ([place])   . Dyspnea   . Esophageal varices ([place])   . Grade II diastolic dysfunction   .  History of kidney stones   . Hyperlipidemia   . Hypertension   . Lower back pain   . Panic attacks   . Pneumonia    currently taking antibiotic and prednisone for early stages of pneumonia  . Pulmonary nodules    bilateral  . Skin cancer    face    Patient Active Problem List   Diagnosis Date Noted  . GI bleed 03/20/2020  . Lower GI bleed 03/19/2020  . Acute on chronic congestive heart failure ([place])   . Symptomatic anemia   . Iron deficiency anemia   . Heme positive stool   . ABLA (acute blood loss anemia) 01/22/2020  . Supplemental oxygen dependent 10/22/2019  . Chronic dyspnea 10/22/2019  . Hypertension associated with diabetes ([place]) 10/22/2019  . Bilateral hand pain 09/21/2016  . Fatigue 07/30/2015  . Chronic diastolic HF (heart failure) ([place]) 06/29/2015  . Postmenopausal bleeding 04/17/2015  . Excessive daytime sleepiness 12/19/2014  . Swelling of lower extremity 11/27/2014  . Back pain 06/13/2013  . Sinusitis, chronic 05/30/2012  . Allergic rhinitis 06/06/2011  . Hyperlipidemia associated with type 2 diabetes mellitus ([place]) 03/24/2009  . Diabetes mellitus, type 2 ([place]) 08/12/2008  . Pulmonary nodule 08/29/2007  . COPD (chronic obstructive pulmonary disease) ([place]) 02/14/2007  . OBESITY, NOS 05/25/2006  . TOBACCO DEPENDENCE 05/25/2006  . Depression, recurrent ([place]) 05/25/2006  . HYPERTENSION, BENIGN SYSTEMIC 05/25/2006  . DJD, UNSPECIFIED 05/25/2006    Past Surgical History:  Procedure Laterality Date  . BIOPSY  04/07/2020   Procedure: BIOPSY;  Surgeon: Eloise Harman, DO;  Location: [facility];  Service: Endoscopy;;  . Breast Cystectomy  Right   . CESAREAN SECTION    . COLONOSCOPY WITH PROPOFOL N/A 01/25/2020   Dr. Abbey Chatters: Nonbleeding internal hemorrhoids, diverticulosis, 5 mm polyp removed from the ascending colon, 10 mm polyp removed from the sigmoid colon, 30 mm polyp (tubulovillous adenoma with no high-grade dysplasia) removed from the transverse colon  via piecemeal status post tattoo.  Other polyps were tubular adenomas.  3 month surveillance colonoscopy recommended.  . COLONOSCOPY WITH PROPOFOL N/A 04/07/2020   Procedure: COLONOSCOPY WITH PROPOFOL;  Surgeon: Eloise Harman, DO;  Location: [facility];  Service: Endoscopy;  Laterality: N/A;  3:00pm, pt knows new time per office  . CYSTOSCOPY/URETEROSCOPY/HOLMIUM LASER/STENT PLACEMENT Bilateral 03/01/2019   Procedure: CYSTOSCOPY/RETROGRADEURETEROSCOPY/HOLMIUM LASER/STENT PLACEMENT;  Surgeon: Ceasar Mons, MD;  Location: [facility];  Service: Urology;  Laterality: Bilateral;  ONLY NEEDS 60 MIN  . ESOPHAGOGASTRODUODENOSCOPY (EGD) WITH PROPOFOL N/A 01/25/2020   Dr. Abbey Chatters: 4 columns grade 1 esophageal varices  . POLYPECTOMY  01/25/2020   Procedure: POLYPECTOMY;  Surgeon: Eloise Harman, DO;  Location: [facility];  Service: Endoscopy;;  . POLYPECTOMY  04/07/2020   Procedure: POLYPECTOMY INTESTINAL;  Surgeon: Eloise Harman, DO;  Location: [facility];  Service: Endoscopy;;  . SKIN CANCER EXCISION     Face  . SPINE SURGERY    . SUBMUCOSAL TATTOO INJECTION  01/25/2020   Procedure: SUBMUCOSAL TATTOO INJECTION;  Surgeon: Eloise Harman, DO;  Location: [facility];  Service: Endoscopy;;     OB History   No obstetric history on file.     Family History  Problem Relation Age of Onset  . Diabetes Father   . Heart disease Father 86       MI  . Hypertension Father   . Anemia Mother        Transfusion dependent  . COPD Sister   . Cancer Paternal Grandmother 5       Pancreatic    Social History   Tobacco Use  . Smoking status: Former Smoker    Packs/day: 1.50    Years: 40.00    Pack years: 60.00    Types: Cigarettes    Quit date: 04/29/2015    Years since quitting: 5.0  . Smokeless tobacco: Never Used  . Tobacco comment: Quit smoking 04/2015- Previous 1.5 ppd smoker  Vaping Use  . Vaping Use: Never used  Substance Use Topics  . Alcohol use: No     Alcohol/week: 0.0 standard drinks  . Drug use: No    Home Medications Prior to Admission medications   Medication Sig Start Date End Date Taking? Authorizing Provider  Blood Glucose Calibration (GLUCOMETER DEX HIGH CONTROL) LIQD Test CBG's once daily.     [provider]  Blood Glucose Monitoring Suppl (TRUE METRIX AIR GLUCOSE METER) w/Device KIT Check BS daily Dx E11.9 10/02/19   Ronnie Doss M, DO  Cholecalciferol (VITAMIN D) 50 MCG (2000 UT) tablet Take 4,000 Units by mouth daily.    [provider]  escitalopram (LEXAPRO) 20 MG tablet Take 1 tablet (20 mg total) by mouth daily. Patient not taking: Reported on 04/01/2020 10/22/19   [name], DO  ferrous sulfate 325 (65 FE) MG tablet Take 1 tablet (325 mg total) by mouth 2 (two) times daily with a meal. 01/26/20 03/26/20  Kathie Dike, MD  glucose blood (TRUE METRIX BLOOD GLUCOSE TEST) test strip Check BS daily Dx E11.9 10/02/19   Ronnie Doss M, DO  lisinopril (ZESTRIL) 10 MG tablet Take 1 tablet by mouth once daily Patient taking differently: Take 10 mg by mouth daily. 02/13/20   [name], DO  lovastatin (MEVACOR) 20 MG tablet Take 1 tablet (20 mg total) by mouth at bedtime. 10/30/19   [name], DO  metFORMIN (GLUCOPHAGE) 1000 MG tablet Take 1 tablet (1,000 mg total) by mouth 2 (two) times daily with a meal. 01/22/20   [name], PA-C  oxyCODONE-acetaminophen (PERCOCET) 10-325 MG tablet Take 1 tablet by mouth every 6 (six) hours as needed for pain.    [provider]  oxymetazoline (AFRIN) 0.05 % nasal spray Place 1 spray into both nostrils 2 (two) times daily as needed for congestion.    [provider]  pantoprazole (PROTONIX) 40 MG tablet Take 1 tablet (40 mg total) by mouth daily. 04/07/20 10/04/20  Eloise Harman, DO  potassium chloride SA (KLOR-CON) 20 MEQ tablet Take 2 tablets (40 mEq total) by mouth 2 (two) times daily. 04/24/20   [name], DO   torsemide (DEMADEX) 20 MG tablet Take 3 tablets (60 mg total) by mouth 2 (two) times daily. Patient taking differently: Take 40 mg by mouth 2 (two) times daily. 02/18/20 05/18/20  [name], PA-C  TRUEplus Lancets 30G MISC Check BS daily Dx E11.9 10/02/19   Ronnie Doss M, DO  albuterol (PROVENTIL HFA;VENTOLIN HFA) 108 (90 Base) MCG/ACT inhaler Inhale 2 puffs into the lungs every 6 (six) hours as needed for wheezing or shortness of breath. 09/21/16 09/22/16  Katheren Shams, DO    Allergies    Keflex [cephalexin]  Review of Systems   Review of Systems  Constitutional: Positive for fatigue. Negative for appetite change, chills, diaphoresis and fever.  HENT: Negative for trouble swallowing.   Respiratory: Negative for cough, shortness of breath and wheezing.   Cardiovascular: Negative for chest pain and palpitations.  Gastrointestinal: Negative for abdominal pain, blood in stool, nausea and vomiting.       Loose brown stool  Genitourinary: Negative for dysuria, flank pain and hematuria.  Musculoskeletal: Positive for myalgias. Negative for arthralgias, back pain, neck pain and neck stiffness.  Skin: Negative for rash.  Neurological: Negative for dizziness, weakness and numbness.  Hematological: Does not bruise/bleed easily.    Physical Exam Updated Vital Signs BP (!) 148/64 (BP Location: Left Arm)   Pulse 88   Temp 98.3 F (36.8 C) (Oral)   Resp 18   Ht 5' 2"  (1.575 m)   Wt 131.5 kg   SpO2 100%   BMI 53.04 kg/m   Physical Exam Constitutional:      General: She is not in acute distress.    Appearance: Normal appearance. She is obese. She is not ill-appearing.  HENT:     Head: Normocephalic.     Mouth/Throat:     Mouth: Mucous membranes are moist.  Eyes:     Pupils: Pupils are equal, round, and reactive to light.  Neck:     Thyroid: No thyromegaly.     Meningeal: Kernig's sign absent.  Cardiovascular:     Rate and Rhythm: Normal rate and regular rhythm.      Pulses: Normal pulses.  Pulmonary:     Effort: Pulmonary effort is normal.     Breath sounds: Normal breath sounds. No wheezing.  Abdominal:     Palpations:  Abdomen is soft.     Tenderness: There is no abdominal tenderness. There is no guarding or rebound.  Genitourinary:    Rectum: Guaiac result positive. No mass or tenderness. Normal anal tone.     Comments: Brown, heme positive stool on digital rectal exam.  No palpable masses or external hemorrhoids. Musculoskeletal:        General: Normal range of motion.     Cervical back: Normal range of motion and neck supple.  Skin:    General: Skin is warm.     Capillary Refill: Capillary refill takes less than 2 seconds.     Findings: No rash.  Neurological:     General: No focal deficit present.     Mental Status: She is alert.     Sensory: No sensory deficit.     Motor: No weakness.     ED Results / Procedures / Treatments   Labs (all labs ordered are listed, but only abnormal results are displayed) Labs Reviewed  COMPREHENSIVE METABOLIC PANEL - Abnormal; Notable for the following components:      Result Value   Potassium 3.4 (*)    Glucose, Bld 119 (*)    Calcium 8.6 (*)    Albumin 2.8 (*)    All other components within normal limits  CBC - Abnormal; Notable for the following components:   RBC 2.89 (*)    Hemoglobin 6.8 (*)    HCT 24.7 (*)    MCH 23.5 (*)    MCHC 27.5 (*)    RDW 19.3 (*)    Platelets 133 (*)    All other components within normal limits  CBC - Abnormal; Notable for the following components:   RBC 3.04 (*)    Hemoglobin 7.4 (*)    HCT 26.1 (*)    MCH 24.3 (*)    MCHC 28.4 (*)    RDW 18.8 (*)    Platelets 117 (*)    All other components within normal limits  POC OCCULT BLOOD, ED - Abnormal  TYPE AND SCREEN  PREPARE RBC (CROSSMATCH)    EKG None  Radiology No results found.  Procedures Procedures    CRITICAL CARE Performed by: [place] Total critical care time: 35   minutes Critical care time was exclusive of separately billable procedures and treating other patients. Critical care was necessary to treat or prevent imminent or life-threatening deterioration. Critical care was time spent personally by me on the following activities: development of treatment plan with patient and/or surrogate as well as nursing, discussions with consultants, evaluation of patient's response to treatment, examination of patient, obtaining history from patient or surrogate, ordering and performing treatments and interventions, ordering and review of laboratory studies, ordering and review of radiographic studies, pulse oximetry and re-evaluation of patient's condition.   Medications Ordered in ED Medications - No data to display  ED Course  I have reviewed the triage vital signs and the nursing notes.  Pertinent labs & imaging results that were available during my care of the patient were reviewed by me and considered in my medical decision making (see chart for details).    MDM Rules/Calculators/A&P                          Patient here with history of anemia and GI bleed.  Has required blood transfusions last October and in December as well.  Symptomatic at present with generalized fatigue and shortness of breath.  No chest pain.  She is on continuous oxygen at 4 L at home.  She had labs drawn at her PCPs office yesterday and noted to have hemoglobin of 6.1.  Advised to come to the emergency department for further evaluation.  On exam today abdomen is nontender.  No tachycardia, tachypnea.  O2 sat on 4 L upper 90s to 100%.  Has heme positive brown stool on rectal exam.  Hemoglobin today 6.8.  She does admit to not taking her iron supplement for 3 to 4 days.  She is also taken some ibuprofen recently.  Patient stable here.  She has ambulated in the department and to the restroom without difficulty.  Benign abdomen on exam.  No respiratory distress noted.  1945 on recheck,  patient has received 1 unit of blood.  She denies improvement of her symptoms.  Repeat CBC hemoglobin now 7.4.  Given patient has history of esophageal varices and GI bleed symptoms unresolved.  She will likely need admission.  Discussed with Dr. Langston Masker who assumes care for plan of admission.   Final Clinical Impression(s) / ED Diagnoses Final diagnoses:  Symptomatic anemia    Rx / DC Orders ED Discharge Orders    None       Kem Parkinson, PA-C 05/16/20 2040    Lorelle Gibbs, DO 05/17/20 [phone]

## 2020-05-16 NOTE — ED Notes (Signed)
Date and time results received: 05/16/20 1231   Test: Hemaglobin Critical Value: 6.8  Name of Provider Notified: [place] DO  Orders Received? Or Actions Taken?: Orders Received - See Orders for details

## 2020-05-17 ENCOUNTER — Encounter (HOSPITAL_COMMUNITY): Payer: Self-pay | Admitting: Internal Medicine

## 2020-05-17 DIAGNOSIS — K7460 Unspecified cirrhosis of liver: Secondary | ICD-10-CM

## 2020-05-17 DIAGNOSIS — K921 Melena: Secondary | ICD-10-CM

## 2020-05-17 DIAGNOSIS — E440 Moderate protein-calorie malnutrition: Secondary | ICD-10-CM

## 2020-05-17 DIAGNOSIS — D649 Anemia, unspecified: Secondary | ICD-10-CM | POA: Diagnosis not present

## 2020-05-17 DIAGNOSIS — D500 Iron deficiency anemia secondary to blood loss (chronic): Secondary | ICD-10-CM | POA: Diagnosis not present

## 2020-05-17 DIAGNOSIS — E8809 Other disorders of plasma-protein metabolism, not elsewhere classified: Secondary | ICD-10-CM

## 2020-05-17 DIAGNOSIS — D62 Acute posthemorrhagic anemia: Secondary | ICD-10-CM

## 2020-05-17 DIAGNOSIS — K7581 Nonalcoholic steatohepatitis (NASH): Secondary | ICD-10-CM

## 2020-05-17 DIAGNOSIS — I5032 Chronic diastolic (congestive) heart failure: Secondary | ICD-10-CM

## 2020-05-17 LAB — COMPREHENSIVE METABOLIC PANEL
ALT: 14 U/L (ref 0–44)
AST: 23 U/L (ref 15–41)
Albumin: 2.7 g/dL — ABNORMAL LOW (ref 3.5–5.0)
Alkaline Phosphatase: 82 U/L (ref 38–126)
Anion gap: 4 — ABNORMAL LOW (ref 5–15)
BUN: 12 mg/dL (ref 8–23)
CO2: 30 mmol/L (ref 22–32)
Calcium: 8.1 mg/dL — ABNORMAL LOW (ref 8.9–10.3)
Chloride: 102 mmol/L (ref 98–111)
Creatinine, Ser: 0.68 mg/dL (ref 0.44–1.00)
GFR, Estimated: >60 mL/min (ref >60)
Glucose, Bld: 115 mg/dL — ABNORMAL HIGH (ref 70–99)
Potassium: 4.1 mmol/L (ref 3.5–5.1)
Sodium: 136 mmol/L (ref 135–145)
Total Bilirubin: 0.3 mg/dL (ref 0.3–1.2)
Total Protein: 7.8 g/dL (ref 6.5–8.1)

## 2020-05-17 LAB — PROTIME-INR
INR: 1.1 (ref 0.8–1.2)
Prothrombin Time: 13.6 seconds (ref 11.4–15.2)

## 2020-05-17 LAB — SARS CORONAVIRUS 2 (TAT 6-24 HRS): SARS Coronavirus 2: NEGATIVE

## 2020-05-17 LAB — PREPARE RBC (CROSSMATCH)

## 2020-05-17 LAB — PHOSPHORUS: Phosphorus: 4.9 mg/dL — ABNORMAL HIGH (ref 2.5–4.6)

## 2020-05-17 LAB — CBC
HCT: 27.5 % — ABNORMAL LOW (ref 36.0–46.0)
Hemoglobin: 7.7 g/dL — ABNORMAL LOW (ref 12.0–15.0)
MCH: 24.3 pg — ABNORMAL LOW (ref 26.0–34.0)
MCHC: 28 g/dL — ABNORMAL LOW (ref 30.0–36.0)
MCV: 86.8 fL (ref 80.0–100.0)
Platelets: 116 10*3/uL — ABNORMAL LOW (ref 150–400)
RBC: 3.17 MIL/uL — ABNORMAL LOW (ref 3.87–5.11)
RDW: 18.7 % — ABNORMAL HIGH (ref 11.5–15.5)
WBC: 5.6 10*3/uL (ref 4.0–10.5)
nRBC: 0 % (ref 0.0–0.2)

## 2020-05-17 LAB — SARS CORONAVIRUS 2 BY RT PCR (HOSPITAL ORDER, PERFORMED IN ~~LOC~~ HOSPITAL LAB): SARS Coronavirus 2: NEGATIVE

## 2020-05-17 LAB — CBG MONITORING, ED: Glucose-Capillary: 125 mg/dL — ABNORMAL HIGH (ref 70–99)

## 2020-05-17 LAB — GLUCOSE, CAPILLARY
Glucose-Capillary: 133 mg/dL — ABNORMAL HIGH (ref 70–99)
Glucose-Capillary: 99 mg/dL (ref 70–99)

## 2020-05-17 LAB — MAGNESIUM: Magnesium: 1.8 mg/dL (ref 1.7–2.4)

## 2020-05-17 MED ORDER — METOCLOPRAMIDE HCL 5 MG/ML IJ SOLN
10.0000 mg | Freq: Once | INTRAMUSCULAR | Status: AC
  Administered 2020-05-17: 10 mg via INTRAVENOUS
  Filled 2020-05-17: qty 2

## 2020-05-17 MED ORDER — SODIUM CHLORIDE 0.9% IV SOLUTION: Freq: Once | INTRAVENOUS | Status: AC

## 2020-05-17 MED ORDER — SODIUM CHLORIDE 0.9 % IV SOLN
1.0000 g | INTRAVENOUS | Status: DC
  Administered 2020-05-17 – 2020-05-19 (×2): 1 g via INTRAVENOUS
  Filled 2020-05-17 (×2): qty 10

## 2020-05-17 MED ORDER — OCTREOTIDE ACETATE 500 MCG/ML IJ SOLN
INTRAMUSCULAR | Status: AC
  Filled 2020-05-17: qty 1

## 2020-05-17 NOTE — ED Notes (Signed)
Pt. States bubble humidifier has helped reduce the pain in her nostril, and is now wearing her nasal cannula properly.

## 2020-05-17 NOTE — H&P (Signed)
History and Physical    Savannah Burns WSF:[id number] DOB: [birth year] DOA: 05/16/2020  PCP: [name], DO  Patient coming from: Home.  I have personally briefly reviewed patient's old medical records in [place]  Chief Complaint: Low hemoglobin level.  HPI: Savannah Burns is a 70 y.o. female with medical history significant of left adrenal adenoma, anxiety, depression, panic attacks, osteoarthritis, type 2 diabetes, dyspnea, grade 2 diastolic dysfunction, urolithiasis, hyperlipidemia, hypertension, chronic lower back pain, history of pneumonia, bilateral pulmonary nodules, facial skin cancer, history of colon polyps, history of lower GI bleed, history of esophageal varices who is coming in with a history of having a low hemoglobin level of 6.1 g/dL associated with generalized weakness and multiple episodes of melena since about a week ago.  She denies abdominal pain, nausea, hematemesis or hematochezia.  No flank pain, dysuria, frequency or hematuria.  She denies fever, chills, sore throat, rhinorrhea, dyspnea, wheezing or hemoptysis.  No chest pain, palpitations, diaphoresis, PND, orthopnea, but gets frequent lower extremity edema.  No polyuria, polydipsia, polyphagia or blurred vision.  ED Course: Initial vital signs were temperature 98.3 F, pulse 100, respirations 20, BP 132/59 mmHg and O2 sat 100% on4 LPM via nasal cannula.  The patient received a unit of PRBC in the ED.  Labwork: CBC showed a white count of 6.1, hemoglobin 6.8 g/dL and platelets 133.  CMP showed a potassium 3.4 mmol/L.  Glucose of 119 and calcium of 8.6 mg/dL.  Renal function was normal.  Hepatic function tests were unremarkable, except for an albumin of 2.8 g/dL.  Stool occult blood was positive.  Review of Systems: As per HPI otherwise all other systems reviewed and are negative.  Past Medical History:  Diagnosis Date   Adrenal adenoma, left    Stable   Anxiety    Arthritis    bilateral  hands   Depression    Diabetes mellitus, type 2 ([place]) 08/12/2008   Qualifier: Diagnosis of  By: Deborra Medina MD, Talia     Dyspnea    Esophageal varices (HCC)    Grade II diastolic dysfunction    History of kidney stones    Hyperlipidemia    Hypertension    Lower back pain    Lower GI bleed 03/19/2020   Panic attacks    Pneumonia    currently taking antibiotic and prednisone for early stages of pneumonia   Pulmonary nodules    bilateral   Skin cancer    face    Past Surgical History:  Procedure Laterality Date   BIOPSY  04/07/2020   Procedure: BIOPSY;  Surgeon: Eloise Harman, DO;  Location: [facility];  Service: Endoscopy;;   Breast Cystectomy  Right    CESAREAN SECTION     COLONOSCOPY WITH PROPOFOL N/A 01/25/2020   Dr. Abbey Chatters: Nonbleeding internal hemorrhoids, diverticulosis, 5 mm polyp removed from the ascending colon, 10 mm polyp removed from the sigmoid colon, 30 mm polyp (tubulovillous adenoma with no high-grade dysplasia) removed from the transverse colon via piecemeal status post tattoo.  Other polyps were tubular adenomas.  3 month surveillance colonoscopy recommended.   COLONOSCOPY WITH PROPOFOL N/A 04/07/2020   Procedure: COLONOSCOPY WITH PROPOFOL;  Surgeon: Eloise Harman, DO;  Location: [facility];  Service: Endoscopy;  Laterality: N/A;  3:00pm, pt knows new time per office   CYSTOSCOPY/URETEROSCOPY/HOLMIUM LASER/STENT PLACEMENT Bilateral 03/01/2019   Procedure: CYSTOSCOPY/RETROGRADEURETEROSCOPY/HOLMIUM LASER/STENT PLACEMENT;  Surgeon: Ceasar Mons, MD;  Location: [facility];  Service: Urology;  Laterality: Bilateral;  ONLY NEEDS 60 MIN   ESOPHAGOGASTRODUODENOSCOPY (EGD) WITH PROPOFOL N/A 01/25/2020   Dr. Abbey Chatters: 4 columns grade 1 esophageal varices   POLYPECTOMY  01/25/2020   Procedure: POLYPECTOMY;  Surgeon: Eloise Harman, DO;  [facility]: [facility];  Service: Endoscopy;;   POLYPECTOMY  04/07/2020   Procedure: POLYPECTOMY  INTESTINAL;  Surgeon: Eloise Harman, DO;  [facility]: [facility];  Service: Endoscopy;;   SKIN CANCER EXCISION     Face   SPINE SURGERY     SUBMUCOSAL TATTOO INJECTION  01/25/2020   Procedure: SUBMUCOSAL TATTOO INJECTION;  Surgeon: Eloise Harman, DO;  Location: [facility];  Service: Endoscopy;;    Social History  reports that she quit smoking about 5 years ago. Her smoking use included cigarettes. She has a 60.00 pack-year smoking history. She has never used smokeless tobacco. She reports that she does not drink alcohol and does not use drugs.  Allergies  Allergen Reactions   Keflex [Cephalexin] Nausea And Vomiting   Family History  Problem Relation Age of Onset   Diabetes Father    Heart disease Father 81       MI   Hypertension Father    Anemia Mother        Transfusion dependent   COPD Sister    Cancer Paternal Grandmother 32       Pancreatic   Prior to Admission medications   Medication Sig Start Date End Date Taking? Authorizing Provider  Cholecalciferol (VITAMIN D) 50 MCG (2000 UT) tablet Take 4,000 Units by mouth daily.   Yes [provider]  ferrous sulfate 325 (65 FE) MG tablet Take 1 tablet (325 mg total) by mouth 2 (two) times daily with a meal. 01/26/20 03/26/20 Yes Memon, Jolaine Artist, MD  lisinopril (ZESTRIL) 10 MG tablet Take 1 tablet by mouth once daily Patient taking differently: Take 10 mg by mouth daily. 02/13/20  Yes Gottschalk, Leatrice Jewels M, DO  lovastatin (MEVACOR) 20 MG tablet Take 1 tablet (20 mg total) by mouth at bedtime. 10/30/19  Yes Ronnie Doss M, DO  metFORMIN (GLUCOPHAGE) 1000 MG tablet Take 1 tablet (1,000 mg total) by mouth 2 (two) times daily with a meal. 01/22/20  Yes [name], PA-C  oxyCODONE-acetaminophen (PERCOCET) 10-325 MG tablet Take 1 tablet by mouth every 6 (six) hours as needed for pain.   Yes [provider]  oxymetazoline (AFRIN) 0.05 % nasal spray Place 1 spray into both nostrils 2 (two)  times daily as needed for congestion.   Yes [provider]  pantoprazole (PROTONIX) 40 MG tablet Take 1 tablet (40 mg total) by mouth daily. 04/07/20 10/04/20 Yes Carver, Elon Alas, DO  potassium chloride SA (KLOR-CON) 20 MEQ tablet Take 2 tablets (40 mEq total) by mouth 2 (two) times daily. 04/24/20  Yes Gottschalk, Leatrice Jewels M, DO  torsemide (DEMADEX) 20 MG tablet Take 3 tablets (60 mg total) by mouth 2 (two) times daily. Patient taking differently: Take 40 mg by mouth 2 (two) times daily. 02/18/20 05/18/20 Yes [name], PA-C  Blood Glucose Calibration (GLUCOMETER DEX HIGH CONTROL) LIQD Test CBG's once daily.     [provider]  Blood Glucose Monitoring Suppl (TRUE METRIX AIR GLUCOSE METER) w/Device KIT Check BS daily Dx E11.9 10/02/19   Ronnie Doss M, DO  escitalopram (LEXAPRO) 20 MG tablet Take 1 tablet (20 mg total) by mouth daily. Patient not taking: No sig reported 10/22/19   Ronnie Doss M, DO  glucose blood (TRUE  METRIX BLOOD GLUCOSE TEST) test strip Check BS daily Dx E11.9 10/02/19   Ronnie Doss M, DO  TRUEplus Lancets 30G MISC Check BS daily Dx E11.9 10/02/19   Ronnie Doss M, DO  albuterol (PROVENTIL HFA;VENTOLIN HFA) 108 (90 Base) MCG/ACT inhaler Inhale 2 puffs into the lungs every 6 (six) hours as needed for wheezing or shortness of breath. 09/21/16 09/22/16  Katheren Shams, DO    Physical Exam: Vitals:   05/16/20 1900 05/16/20 1915 05/16/20 2110 05/17/20 0248  BP: 139/66  (!) 155/69 133/73  Pulse: 98 93 89 74  Resp: 19 17 18 14   Temp:    98 F (36.7 C)  TempSrc:    Oral  SpO2: 100% 99% 98% 99%  Weight:      Height:        Constitutional: Looks chronically ill, but in NAD, calm, comfortable Eyes: PERRL, lids and conjunctivae are pale. ENMT: Mucous membranes are moist. Posterior pharynx clear of any exudate or lesions. Neck: normal, supple, no masses, no thyromegaly Respiratory: clear to auscultation bilaterally, no wheezing, no crackles.  Normal respiratory effort. No accessory muscle use.  Cardiovascular: Regular rate and rhythm, no murmurs / rubs / gallops.  Stage III lymphedema.  No extremity edema. 2+ pedal pulses. No carotid bruits.  Abdomen: Obese, no distention.  Bowel sounds positive.  Soft, no tenderness, no masses palpated. No hepatosplenomegaly.  Musculoskeletal: Generalized weakness.  No clubbing / cyanosis. Good ROM, no contractures. Normal muscle tone.  Skin: Bilateral pretibial erythema. Neurologic: CN 2-12 grossly intact. Sensation intact, DTR normal.  Moderate symmetric/nonfocal weakness. Psychiatric: Normal judgment and insight. Alert and oriented x 3. Normal mood.   Labs on Admission: I have personally reviewed following labs and imaging studies  CBC: Recent Labs  Lab 05/16/20 1206 05/16/20 2001  WBC 6.1 6.1  HGB 6.8* 7.4*  HCT 24.7* 26.1*  MCV 85.5 85.9  PLT 133* 117*    Basic Metabolic Panel: Recent Labs  Lab 05/16/20 1206  NA 137  K 3.4*  CL 101  CO2 30  GLUCOSE 119*  BUN 10  CREATININE 0.57  CALCIUM 8.6*    GFR: Estimated Creatinine Clearance: 85.4 mL/min (by C-G formula based on SCr of 0.57 mg/dL).  Liver Function Tests: Recent Labs  Lab 05/16/20 1206  AST 19  ALT 14  ALKPHOS 83  BILITOT 0.3  PROT 7.7  ALBUMIN 2.8*    Radiological Exams on Admission: No results found.  EKG: Independently reviewed.  Assessment/Plan Principal Problem:   Melena Observation/telemetry. Keep NPO. Monitor H&H. Antiemetics as needed. Protonix 40 mg IVP every 12 hours. Start octreotide bolus and infusion. Consult gastroenterology.  Active Problems:   ABLA (acute blood loss anemia)  Transfuse as needed. Monitor hematocrit and hemoglobin.    Hypokalemia Replacing. Follow-up potassium level.    Diabetes mellitus, type 2 (HCC) Currently NPO. Hold Metformin. CBG monitoring every 6 hours while NPO.    Depression, recurrent (HCC) Continue escitalopram 20 mg p.o. daily once  cleared for oral intake.    COPD (chronic obstructive pulmonary disease) (HCC) Continue supplemental oxygen. Bronchodilators as needed.    Chronic diastolic HF (heart failure) (HCC) Compensated at this time. Monitor intake and output. Resume lisinopril and torsemide once cleared for oral intake.    Hypertension associated with diabetes ([place]) Hold antihypertensives. Monitor blood pressure.    Hyperlipidemia Resume lovastatin once cleared for oral intake.    Moderate protein malnutrition (HCC)   Hypoalbuminemia Nutritional services evaluation.    Class 3 obesity  Lifestyle modifications. Nutritional services evaluation. Follow-up with PCP.   DVT prophylaxis: SCDs. Code Status:   Full code. Family Communication: Disposition Plan:   Patient is from:  Home.  Anticipated DC to:  Home.  Anticipated DC date:  05/18/2020.  Anticipated DC barriers: Clinical status. Consults called:  Routine gastroenterology consult. Admission status:  Observation/telemetry.  Severity of Illness:  High due to age, multiple medical conditions including history of esophageal varices presenting with melena.  The patient will need to remain in the hospital for blood transfusion, H&H monitoring, pantoprazole IV every 12 hours and octreotide infusion.  We will consult gastroenterology in the morning to evaluate for endoscopic studies.  Reubin Milan MD [facility]  How to contact the [facility] Attending or Consulting provider [place] or covering provider during after hours [place], for this patient?   1. Check the care team in [facility] and look for a) attending/consulting [facility] provider listed and b) the [facility] team listed 2. Log into [email or web address] and use [place]'s universal password to access. If you do not have the password, please contact the hospital operator. 3. Locate the [facility] provider you are looking for under [facility] and page to a number that you can be directly reached. 4. If you  still have difficulty reaching the provider, please page the [facility] (Director on Call) for the Hospitalists listed on amion for assistance.  05/17/2020, 4:31 AM   This document was prepared using Dragon voice recognition software and may contain some unintended transcription errors.

## 2020-05-17 NOTE — Consult Note (Signed)
[name], M.D. Gastroenterology & Hepatology                                           Patient Name: Savannah Burns Account #: _0 @   MRN: [id number] Admission Date: 05/16/2020 Date of Evaluation:  05/17/2020 Time of Evaluation: 11:05 AM  Referring Physician: Irwin Brakeman, MD  Chief Complaint: Symptomatic anemia and possible melena  HPI:  This is a 70 y.o. female with history of adrenal adenoma, depression, anxiety, diabetes, grade 2 diastolic heart failure, hyperlipidemia, hypertension, obesity, on chronic oxygen supplementation for unknown etiology, iron deficiency anemia, large colonic polyps, NASH cirrhosis with presence of grade 1 esophageal varices, who came to the hospital after presenting worsening fatigue and was found to have severe anemia.  The patient states that for the last couple weeks she noticed she was feeling more sleepy than usual.  Overall, she was feeling more tired and fatigue, with shortness of breath even when sitting.  The patient denies having any lightheadedness or syncopal episodes.  Notably, the patient states that her stools were more "pasty than usual" but she did not see any melena or darker stools, denies having any episodes of hematochezia or hematemesis.  The patient has a history of iron deficiency anemia for which she is taking oral iron twice a day compliantly.  The patient denies having any nausea, vomiting, fever, chills, abdominal distention, abdominal pain, diarrhea, jaundice, pruritus or weight loss.  The patient states that she took 2 ibuprofen pills the day before her presentation due to headache but she usually does not take any NSAIDs or any antiplatelet/anticoagulants chronically.  She is to take a baby aspirin but quit taking it several months ago.  In the ED, she was HD stable and afebrile. Labs were remarkable for CBC with a hemoglobin of 6.8 and MCV of eighty-five decreased, normal also count of 6.1 and platelets of 133 with  slightly decreased, CMP showed normal liver function tests and renal function, potassium was slightly decreased 3.4 with rest of electrolytes within normal limits.  INR today was 1.1.  The patient received 2 units of PRBC with repeat hemoglobin of 7.7 today.  Last EGD: 01/25/2020 - Grade I esophageal varices. Last Colonoscopy: 03/2020 - Non-bleeding internal hemorrhoids. - Diverticulosis in the sigmoid colon and in the descending colon. - One 10 mm polyp in the cecum, removed with mucosal resection. Resected and Retrieved. SSL. - One 7 mm polyp in the ascending colon, removed with a hot snare. Resected and Retrieved. Tubular adenoma. - One 18 mm polyp at the hepatic flexure, removed with a hot snare. Resected and Retrieved. Tubular adenoma. - Post-polypectomy scar in the transverse colon. Biopsied - colonic mucosa with focal fibrosis.  Past Medical History: SEE CHRONIC ISSSUES: Past Medical History:  Diagnosis Date  . Adrenal adenoma, left    Stable  . Anxiety   . Arthritis    bilateral hands  . Depression   . Diabetes mellitus, type 2 ([place]) 08/12/2008   Qualifier: Diagnosis of  By: Deborra Medina MD, Tanja Port    . Dyspnea   . Esophageal varices ([place])   . Grade II diastolic dysfunction   . History of kidney stones   . Hyperlipidemia   . Hypertension   . Lower back pain   . Lower GI bleed 03/19/2020  . Panic attacks   . Pneumonia    currently taking  antibiotic and prednisone for early stages of pneumonia  . Pulmonary nodules    bilateral  . Skin cancer    face   Past Surgical History:  Past Surgical History:  Procedure Laterality Date  . BIOPSY  04/07/2020   Procedure: BIOPSY;  Surgeon: Eloise Harman, DO;  [facility];  Service: Endoscopy;;  . Breast Cystectomy  Right   . CESAREAN SECTION    . COLONOSCOPY WITH PROPOFOL N/A 01/25/2020   Dr. Abbey Chatters: Nonbleeding internal hemorrhoids, diverticulosis, 5 mm polyp removed from the ascending colon, 10 mm polyp removed from the  sigmoid colon, 30 mm polyp (tubulovillous adenoma with no high-grade dysplasia) removed from the transverse colon via piecemeal status post tattoo.  Other polyps were tubular adenomas.  3 month surveillance colonoscopy recommended.  . COLONOSCOPY WITH PROPOFOL N/A 04/07/2020   Procedure: COLONOSCOPY WITH PROPOFOL;  Surgeon: Eloise Harman, DO;  [facility];  Service: Endoscopy;  Laterality: N/A;  3:00pm, pt knows new time per office  . CYSTOSCOPY/URETEROSCOPY/HOLMIUM LASER/STENT PLACEMENT Bilateral 03/01/2019   Procedure: CYSTOSCOPY/RETROGRADEURETEROSCOPY/HOLMIUM LASER/STENT PLACEMENT;  Surgeon: Ceasar Mons, MD;  Location: [facility];  Service: Urology;  Laterality: Bilateral;  ONLY NEEDS 60 MIN  . ESOPHAGOGASTRODUODENOSCOPY (EGD) WITH PROPOFOL N/A 01/25/2020   Dr. Abbey Chatters: 4 columns grade 1 esophageal varices  . POLYPECTOMY  01/25/2020   Procedure: POLYPECTOMY;  Surgeon: Eloise Harman, DO;  [facility];  Service: Endoscopy;;  . POLYPECTOMY  04/07/2020   Procedure: POLYPECTOMY INTESTINAL;  Surgeon: Eloise Harman, DO;  [facility];  Service: Endoscopy;;  . SKIN CANCER EXCISION     Face  . SPINE SURGERY    . SUBMUCOSAL TATTOO INJECTION  01/25/2020   Procedure: SUBMUCOSAL TATTOO INJECTION;  Surgeon: Eloise Harman, DO;  [facility];  Service: Endoscopy;;   Family History:  Family History  Problem Relation Age of Onset  . Diabetes Father   . Heart disease Father 17       MI  . Hypertension Father   . Anemia Mother        Transfusion dependent  . COPD Sister   . Cancer Paternal Grandmother 85       Pancreatic   Social History:  Social History   Tobacco Use  . Smoking status: Former Smoker    Packs/day: 1.50    Years: 40.00    Pack years: 60.00    Types: Cigarettes    Quit date: 04/29/2015    Years since quitting: 5.0  . Smokeless tobacco: Never Used  . Tobacco comment: Quit smoking 04/2015- Previous 1.5 ppd smoker   Vaping Use  . Vaping Use: Never used  Substance Use Topics  . Alcohol use: No    Alcohol/week: 0.0 standard drinks  . Drug use: No    Home Medications:  Prior to Admission medications   Medication Sig Start Date End Date Taking? Authorizing Provider  Cholecalciferol (VITAMIN D) 50 MCG (2000 UT) tablet Take 4,000 Units by mouth daily.   Yes [provider]  ferrous sulfate 325 (65 FE) MG tablet Take 1 tablet (325 mg total) by mouth 2 (two) times daily with a meal. 01/26/20 03/26/20 Yes Memon, Jolaine Artist, MD  lisinopril (ZESTRIL) 10 MG tablet Take 1 tablet by mouth once daily Patient taking differently: Take 10 mg by mouth daily. 02/13/20  Yes Gottschalk, Leatrice Jewels M, DO  lovastatin (MEVACOR) 20 MG tablet Take 1 tablet (20 mg total) by mouth at bedtime. 10/30/19  Yes Ronnie Doss M, DO  metFORMIN (GLUCOPHAGE) 1000 MG tablet Take 1 tablet (1,000 mg total) by mouth 2 (two) times daily with a meal. 01/22/20  Yes [name], PA-C  oxyCODONE-acetaminophen (PERCOCET) 10-325 MG tablet Take 1 tablet by mouth every 6 (six) hours as needed for pain.   Yes [provider]  oxymetazoline (AFRIN) 0.05 % nasal spray Place 1 spray into both nostrils 2 (two) times daily as needed for congestion.   Yes [provider]  pantoprazole (PROTONIX) 40 MG tablet Take 1 tablet (40 mg total) by mouth daily. 04/07/20 10/04/20 Yes Carver, Elon Alas, DO  potassium chloride SA (KLOR-CON) 20 MEQ tablet Take 2 tablets (40 mEq total) by mouth 2 (two) times daily. 04/24/20  Yes Gottschalk, Leatrice Jewels M, DO  torsemide (DEMADEX) 20 MG tablet Take 3 tablets (60 mg total) by mouth 2 (two) times daily. Patient taking differently: Take 40 mg by mouth 2 (two) times daily. 02/18/20 05/18/20 Yes [name], PA-C  Blood Glucose Calibration (GLUCOMETER DEX HIGH CONTROL) LIQD Test CBG's once daily.     [provider]  Blood Glucose Monitoring Suppl (TRUE METRIX AIR GLUCOSE METER) w/Device KIT Check BS  daily Dx E11.9 10/02/19   Ronnie Doss M, DO  escitalopram (LEXAPRO) 20 MG tablet Take 1 tablet (20 mg total) by mouth daily. Patient not taking: No sig reported 10/22/19   Ronnie Doss M, DO  glucose blood (TRUE METRIX BLOOD GLUCOSE TEST) test strip Check BS daily Dx E11.9 10/02/19   Ronnie Doss M, DO  TRUEplus Lancets 30G MISC Check BS daily Dx E11.9 10/02/19   Ronnie Doss M, DO  albuterol (PROVENTIL HFA;VENTOLIN HFA) 108 (90 Base) MCG/ACT inhaler Inhale 2 puffs into the lungs every 6 (six) hours as needed for wheezing or shortness of breath. 09/21/16 09/22/16  Katheren Shams, DO    Inpatient Medications:  Current Facility-Administered Medications:  .  0.9 %  sodium chloride infusion (Manually program via Guardrails IV Fluids), , Intravenous, Once, Johnson, Clanford L, MD .  acetaminophen (TYLENOL) tablet 650 mg, 650 mg, Oral, Q6H PRN **OR** acetaminophen (TYLENOL) suppository 650 mg, 650 mg, Rectal, Q6H PRN, Reubin Milan, MD .  metoCLOPramide (REGLAN) injection 10 mg, 10 mg, Intravenous, Once, Johnson, Clanford L, MD .  [COMPLETED] octreotide (SANDOSTATIN) 2 mcg/mL load via infusion 100 mcg, 100 mcg, Intravenous, Once, 100 mcg at 05/17/20 0248 **AND** octreotide (SANDOSTATIN) 500 mcg in sodium chloride 0.9 % 250 mL (2 mcg/mL) infusion, 50 mcg/hr, Intravenous, Continuous, Reubin Milan, MD, Last Rate: 25 mL/hr at 05/17/20 0242, 50 mcg/hr at 05/17/20 0242 .  ondansetron (ZOFRAN) tablet 4 mg, 4 mg, Oral, Q6H PRN **OR** ondansetron (ZOFRAN) injection 4 mg, 4 mg, Intravenous, Q6H PRN, Reubin Milan, MD .  pantoprazole (PROTONIX) injection 40 mg, 40 mg, Intravenous, Q12H, Reubin Milan, MD, 40 mg at 05/17/20 1100  Current Outpatient Medications:  .  Cholecalciferol (VITAMIN D) 50 MCG (2000 UT) tablet, Take 4,000 Units by mouth daily., Disp: , Rfl:  .  ferrous sulfate 325 (65 FE) MG tablet, Take 1 tablet (325 mg total) by mouth 2 (two) times daily with a meal.,  Disp: 60 tablet, Rfl: 1 .  lisinopril (ZESTRIL) 10 MG tablet, Take 1 tablet by mouth once daily (Patient taking differently: Take 10 mg by mouth daily.), Disp: 90 tablet, Rfl: 0 .  lovastatin (MEVACOR) 20 MG tablet, Take 1 tablet (20 mg total) by mouth at bedtime., Disp: 90 tablet, Rfl: 3 .  metFORMIN (GLUCOPHAGE) 1000 MG tablet, Take 1 tablet (1,000 mg total) by mouth 2 (two) times daily with a meal., Disp: 180 tablet, Rfl: 3 .  oxyCODONE-acetaminophen (PERCOCET) 10-325 MG tablet, Take 1 tablet by mouth every 6 (six) hours as needed for pain., Disp: , Rfl:  .  oxymetazoline (AFRIN) 0.05 % nasal spray, Place 1 spray into both nostrils 2 (two) times daily as needed for congestion., Disp: , Rfl:  .  pantoprazole (PROTONIX) 40 MG tablet, Take 1 tablet (40 mg total) by mouth daily., Disp: 30 tablet, Rfl: 5 .  potassium chloride SA (KLOR-CON) 20 MEQ tablet, Take 2 tablets (40 mEq total) by mouth 2 (two) times daily., Disp: 360 tablet, Rfl: 3 .  torsemide (DEMADEX) 20 MG tablet, Take 3 tablets (60 mg total) by mouth 2 (two) times daily. (Patient taking differently: Take 40 mg by mouth 2 (two) times daily.), Disp: 180 tablet, Rfl: 11 .  Blood Glucose Calibration (GLUCOMETER DEX HIGH CONTROL) LIQD, Test CBG's once daily. , Disp: , Rfl:  .  Blood Glucose Monitoring Suppl (TRUE METRIX AIR GLUCOSE METER) w/Device KIT, Check BS daily Dx E11.9, Disp: 1 kit, Rfl: 0 .  escitalopram (LEXAPRO) 20 MG tablet, Take 1 tablet (20 mg total) by mouth daily. (Patient not taking: No sig reported), Disp: 90 tablet, Rfl: 0 .  glucose blood (TRUE METRIX BLOOD GLUCOSE TEST) test strip, Check BS daily Dx E11.9, Disp: 100 each, Rfl: 3 .  TRUEplus Lancets 30G MISC, Check BS daily Dx E11.9, Disp: 100 each, Rfl: 3 Allergies: Keflex [cephalexin]  Complete Review of Systems: GENERAL: negative for malaise, night sweats HEENT: No changes in hearing or vision, no nose bleeds or other nasal problems. NECK: Negative for lumps, goiter,  pain and significant neck swelling RESPIRATORY: Negative for cough, wheezing CARDIOVASCULAR: Negative for chest pain, leg swelling, palpitations, orthopnea GI: SEE HPI MUSCULOSKELETAL: Negative for joint pain or swelling, back pain, and muscle pain. SKIN: Negative for lesions, rash PSYCH: Negative for sleep disturbance, mood disorder and recent psychosocial stressors. HEMATOLOGY Negative for prolonged bleeding, bruising easily, and swollen nodes. ENDOCRINE: Negative for cold or heat intolerance, polyuria, polydipsia and goiter. NEURO: negative for tremor, gait imbalance, syncope and seizures. The remainder of the review of systems is noncontributory.  Physical Exam: BP 127/64   Pulse 85   Temp 98 F (36.7 C) (Oral)   Resp 14   Ht _0  (1.575 m)   Wt 131.5 kg   SpO2 100%   BMI 53.04 kg/m  GENERAL: The patient is AO x3, in no acute distress. Obese. Using [place]. HEENT: Head is normocephalic and atraumatic. EOMI are intact. Mouth is well hydrated and without lesions. NECK: Supple. No masses LUNGS: Clear to auscultation. No presence of rhonchi/wheezing/rales. Adequate chest expansion HEART: RRR, normal s1 and s2. ABDOMEN: Soft, nontender, no guarding, no peritoneal signs, and nondistended. BS +. No masses. RECTAL EXAM: no external lesions, normal tone, no masses, brown stool without blood, no melena EXTREMITIES: Without any cyanosis, clubbing, rash, lesions or edema. NEUROLOGIC: AOx3, no focal motor deficit. SKIN: no jaundice, no rashes  Laboratory Data CBC:     Component Value Date/Time   WBC 5.6 05/17/2020 0524   RBC 3.17 (L) 05/17/2020 0524   HGB 7.7 (L) 05/17/2020 0524   HGB 8.8 (L) 03/25/2020 1357   HCT 27.5 (L) 05/17/2020 0524   HCT 29.8 (L) 03/25/2020 1357   PLT 116 (L) 05/17/2020 0524   PLT 142 (L) 03/25/2020 1357   MCV 86.8 05/17/2020  0524   MCV 84 03/25/2020 1357   MCH 24.3 (L) 05/17/2020 0524   MCHC 28.0 (L) 05/17/2020 0524   RDW 18.7 (H) 05/17/2020 0524   RDW  17.0 (H) 03/25/2020 1357   LYMPHSABS 1.6 03/20/2020 0411   MONOABS 0.7 03/20/2020 0411   EOSABS 0.1 03/20/2020 0411   BASOSABS 0.0 03/20/2020 0411   COAG:  Lab Results  Component Value Date   INR 1.1 05/17/2020   INR 1.0 03/19/2020   INR 1.1 01/22/2020    BMP:  BMP Latest Ref Rng & Units 05/17/2020 05/16/2020 03/19/2020  Glucose 70 - 99 mg/dL 115(H) 119(H) 165(H)  BUN 8 - 23 mg/dL _0 Creatinine 0.44 - 1.00 mg/dL 0.68 0.57 0.73  BUN/Creat Ratio 12 - 28 - - -  Sodium 135 - 145 mmol/L 136 137 136  Potassium 3.5 - 5.1 mmol/L 4.1 3.4(L) 3.8  Chloride 98 - 111 mmol/L 102 101 97(L)  CO2 22 - 32 mmol/L _1 Calcium 8.9 - 10.3 mg/dL 8.1(L) 8.6(L) 8.2(L)    HEPATIC:  Hepatic Function Latest Ref Rng & Units 05/17/2020 05/16/2020 03/19/2020  Total Protein 6.5 - 8.1 g/dL 7.8 7.7 8.0  Albumin 3.5 - 5.0 g/dL 2.7(L) 2.8(L) 2.8(L)  AST 15 - 41 U/L _2 ALT 0 - 44 U/L _3 Alk Phosphatase 38 - 126 U/L 82 83 86  Total Bilirubin 0.3 - 1.2 mg/dL 0.3 0.3 0.4    CARDIAC: No results found for: CKTOTAL, CKMB, CKMBINDEX, TROPONINI   Imaging: I personally reviewed and interpreted the available imaging.  Assessment & Plan: 70 y.o. female with history of adrenal adenoma, depression, anxiety, diabetes, grade 2 diastolic heart failure, hyperlipidemia, hypertension, obesity, on chronic oxygen supplementation for unknown etiology, iron deficiency anemia, large colonic polyps, NASH cirrhosis with presence of grade 1 esophageal varices, who came to the hospital after presenting worsening fatigue and was found to have severe anemia.  Patient has presented hemodynamic stability since admission and adequately responded to transfusion of 2 units of PRBC.  However, she is not yet optimized as her hemoglobin is less than eight given her history of heart failure.  She is not presenting any symptoms of overt gastrointestinal bleeding.  I suspect her current presentation is likely related to bleeding  from her small bowel but given her drop in her hemoglobin she warrants an endoscopic evaluation with an EGD during this admission.  I would recommend giving her 1 unit of PRBC and continue her on PPI twice daily.  She can continue with the octreotide infusion but I think it is less likely she has a variceal bleeding.  Given the concern of variceal bleeding by the primary team, will recommend ceftriaxone prophylaxis until an active bleeding from an upper GI source has been ruled out.  # Severe symptomatic anemia # NASH cirrhosis - Transfuse 1 UPRBC and repeat CBC qday, transfuse if Hb <7 - Pantoprazole 40 mg q12h IVP - 2 large bore IV lines - Active T/S - CLD today, keep NPO after MN - Avoid NSAIDs - Can continue octreotide for now - Start ceftriaxone PPX until upper GI source has been ruled out - Will proceed with EGD tomorrow - Patient may require outpatient capsule endoscopy if workup is negative  Harvel Quale, MD Gastroenterology and Hepatology [facility]

## 2020-05-17 NOTE — Progress Notes (Signed)
ASSUMPTION OF CARE NOTE   05/17/2020 12:04 PM  Savannah Burns was seen and examined.  The H&P by the admitting provider, orders, imaging was reviewed.  Please see new orders.  I discussed plan of care with Dr. Jenetta Downer.  Ordered 1 additional unit of PRBC.  GI started ceftriaxone.  Tentative plan is for EGD 2/21.    Vitals:   05/17/20 0700 05/17/20 1102  BP: 127/64 138/80  Pulse: 85 84  Resp: 14 20  Temp:  97.9 F (36.6 C)  SpO2: 100% 100%    Results for orders placed or performed during the hospital encounter of 05/16/20  SARS Coronavirus 2 by RT PCR (hospital order, performed in [place] hospital lab) Nasopharyngeal Nasopharyngeal Swab   Specimen: Nasopharyngeal Swab  Result Value Ref Range   SARS Coronavirus 2 NEGATIVE NEGATIVE  Comprehensive metabolic panel  Result Value Ref Range   Sodium 137 135 - 145 mmol/L   Potassium 3.4 (L) 3.5 - 5.1 mmol/L   Chloride 101 98 - 111 mmol/L   CO2 30 22 - 32 mmol/L   Glucose, Bld 119 (H) 70 - 99 mg/dL   BUN 10 8 - 23 mg/dL   Creatinine, Ser 0.57 0.44 - 1.00 mg/dL   Calcium 8.6 (L) 8.9 - 10.3 mg/dL   Total Protein 7.7 6.5 - 8.1 g/dL   Albumin 2.8 (L) 3.5 - 5.0 g/dL   AST 19 15 - 41 U/L   ALT 14 0 - 44 U/L   Alkaline Phosphatase 83 38 - 126 U/L   Total Bilirubin 0.3 0.3 - 1.2 mg/dL   GFR, Estimated >60 >60 mL/min   Anion gap 6 5 - 15  CBC  Result Value Ref Range   WBC 6.1 4.0 - 10.5 K/uL   RBC 2.89 (L) 3.87 - 5.11 MIL/uL   Hemoglobin 6.8 (LL) 12.0 - 15.0 g/dL   HCT 24.7 (L) 36.0 - 46.0 %   MCV 85.5 80.0 - 100.0 fL   MCH 23.5 (L) 26.0 - 34.0 pg   MCHC 27.5 (L) 30.0 - 36.0 g/dL   RDW 19.3 (H) 11.5 - 15.5 %   Platelets 133 (L) 150 - 400 K/uL   nRBC 0.0 0.0 - 0.2 %  CBC  Result Value Ref Range   WBC 6.1 4.0 - 10.5 K/uL   RBC 3.04 (L) 3.87 - 5.11 MIL/uL   Hemoglobin 7.4 (L) 12.0 - 15.0 g/dL   HCT 26.1 (L) 36.0 - 46.0 %   MCV 85.9 80.0 - 100.0 fL   MCH 24.3 (L) 26.0 - 34.0 pg   MCHC 28.4 (L) 30.0 - 36.0 g/dL   RDW 18.8  (H) 11.5 - 15.5 %   Platelets 117 (L) 150 - 400 K/uL   nRBC 0.0 0.0 - 0.2 %  CBC  Result Value Ref Range   WBC 5.6 4.0 - 10.5 K/uL   RBC 3.17 (L) 3.87 - 5.11 MIL/uL   Hemoglobin 7.7 (L) 12.0 - 15.0 g/dL   HCT 27.5 (L) 36.0 - 46.0 %   MCV 86.8 80.0 - 100.0 fL   MCH 24.3 (L) 26.0 - 34.0 pg   MCHC 28.0 (L) 30.0 - 36.0 g/dL   RDW 18.7 (H) 11.5 - 15.5 %   Platelets 116 (L) 150 - 400 K/uL   nRBC 0.0 0.0 - 0.2 %  Comprehensive metabolic panel  Result Value Ref Range   Sodium 136 135 - 145 mmol/L   Potassium 4.1 3.5 - 5.1 mmol/L   Chloride 102  98 - 111 mmol/L   CO2 30 22 - 32 mmol/L   Glucose, Bld 115 (H) 70 - 99 mg/dL   BUN 12 8 - 23 mg/dL   Creatinine, Ser 0.68 0.44 - 1.00 mg/dL   Calcium 8.1 (L) 8.9 - 10.3 mg/dL   Total Protein 7.8 6.5 - 8.1 g/dL   Albumin 2.7 (L) 3.5 - 5.0 g/dL   AST 23 15 - 41 U/L   ALT 14 0 - 44 U/L   Alkaline Phosphatase 82 38 - 126 U/L   Total Bilirubin 0.3 0.3 - 1.2 mg/dL   GFR, Estimated >60 >60 mL/min   Anion gap 4 (L) 5 - 15  Protime-INR  Result Value Ref Range   Prothrombin Time 13.6 11.4 - 15.2 seconds   INR 1.1 0.8 - 1.2  Magnesium  Result Value Ref Range   Magnesium 1.8 1.7 - 2.4 mg/dL  Phosphorus  Result Value Ref Range   Phosphorus 4.9 (H) 2.5 - 4.6 mg/dL  POC occult blood, ED  Result Value Ref Range   [id number] POSITIVE   Type and screen [facility]  Result Value Ref Range   ABO/RH(D) A POS    Antibody Screen NEG    Sample Expiration 05/19/2020,2359    Unit Number [id number]    Blood Component Type RED CELLS,LR    Unit division 00    Status of Unit ISSUED,FINAL    Transfusion Status OK TO TRANSFUSE    Crossmatch Result Compatible    Unit Number [id number]    Blood Component Type RED CELLS,LR    Unit division 00    Status of Unit ALLOCATED    Transfusion Status OK TO TRANSFUSE    Crossmatch Result      Compatible Performed at [facility], [address] [place], [place] [address]   Prepare RBC (crossmatch)   Result Value Ref Range   Order Confirmation      ORDER PROCESSED BY BLOOD BANK Performed at [facility], [address], [place], [place] [address]   Prepare RBC (crossmatch)  Result Value Ref Range   Order Confirmation      ORDER PROCESSED BY BLOOD BANK Performed at [facility], [address], [place], [place] [address]   [facility]  Result Value Ref Range   ISSUE DATE / TIME [id number]    Blood Product Unit Number [id number]    PRODUCT CODE [id number]    Unit Type and Rh 0600    Blood Product Expiration Date [id number]    Blood Product Unit Number [id number]    PRODUCT CODE [id number]    Unit Type and Rh 6200    Blood Product Expiration Date 244975300511    C. Wynetta Emery, MD [facility]   05/16/2020 11:53 AM How to contact the [facility] Attending or Consulting provider [place] or covering provider during after hours [place], for this patient?  1. Check the care team in [facility] and look for a) attending/consulting [facility] provider listed and b) the [facility] team listed 2. Log into [email or web address] and use [place]'s universal password to access. If you do not have the password, please contact the hospital operator. 3. Locate the [facility] provider you are looking for under [facility] and page to a number that you can be directly reached. 4. If you still have difficulty reaching the provider, please page the [facility] (Director on Call) for the Hospitalists listed on amion for assistance.

## 2020-05-17 NOTE — H&P (View-Only) (Signed)
Savannah Burns, M.D. Gastroenterology & Hepatology                                           Patient Name: Savannah Burns Account #: _0 @   MRN: [id number] Admission Date: 05/16/2020 Date of Evaluation:  05/17/2020 Time of Evaluation: 11:05 AM  Referring Physician: Irwin Brakeman, MD  Chief Complaint: Symptomatic anemia and possible melena  HPI:  This is a 70 y.o. female with history of adrenal adenoma, depression, anxiety, diabetes, grade 2 diastolic heart failure, hyperlipidemia, hypertension, obesity, on chronic oxygen supplementation for unknown etiology, iron deficiency anemia, large colonic polyps, NASH cirrhosis with presence of grade 1 esophageal varices, who came to the hospital after presenting worsening fatigue and was found to have severe anemia.  The patient states that for the last couple weeks she noticed she was feeling more sleepy than usual.  Overall, she was feeling more tired and fatigue, with shortness of breath even when sitting.  The patient denies having any lightheadedness or syncopal episodes.  Notably, the patient states that her stools were more "pasty than usual" but she did not see any melena or darker stools, denies having any episodes of hematochezia or hematemesis.  The patient has a history of iron deficiency anemia for which she is taking oral iron twice a day compliantly.  The patient denies having any nausea, vomiting, fever, chills, abdominal distention, abdominal pain, diarrhea, jaundice, pruritus or weight loss.  The patient states that she took 2 ibuprofen pills the day before her presentation due to headache but she usually does not take any NSAIDs or any antiplatelet/anticoagulants chronically.  She is to take a baby aspirin but quit taking it several months ago.  In the ED, she was HD stable and afebrile. Labs were remarkable for CBC with a hemoglobin of 6.8 and MCV of eighty-five decreased, normal also count of 6.1 and platelets of 133 with  slightly decreased, CMP showed normal liver function tests and renal function, potassium was slightly decreased 3.4 with rest of electrolytes within normal limits.  INR today was 1.1.  The patient received 2 units of PRBC with repeat hemoglobin of 7.7 today.  Last EGD: 01/25/2020 - Grade I esophageal varices. Last Colonoscopy: 03/2020 - Non-bleeding internal hemorrhoids. - Diverticulosis in the sigmoid colon and in the descending colon. - One 10 mm polyp in the cecum, removed with mucosal resection. Resected and Retrieved. SSL. - One 7 mm polyp in the ascending colon, removed with a hot snare. Resected and Retrieved. Tubular adenoma. - One 18 mm polyp at the hepatic flexure, removed with a hot snare. Resected and Retrieved. Tubular adenoma. - Post-polypectomy scar in the transverse colon. Biopsied - colonic mucosa with focal fibrosis.  Past Medical History: SEE CHRONIC ISSSUES: Past Medical History:  Diagnosis Date  . Adrenal adenoma, left    Stable  . Anxiety   . Arthritis    bilateral hands  . Depression   . Diabetes mellitus, type 2 ([place]) 08/12/2008   Qualifier: Diagnosis of  By: Deborra Medina MD, Tanja Port    . Dyspnea   . Esophageal varices ([place])   . Grade II diastolic dysfunction   . History of kidney stones   . Hyperlipidemia   . Hypertension   . Lower back pain   . Lower GI bleed 03/19/2020  . Panic attacks   . Pneumonia    currently taking  antibiotic and prednisone for early stages of pneumonia  . Pulmonary nodules    bilateral  . Skin cancer    face   Past Surgical History:  Past Surgical History:  Procedure Laterality Date  . BIOPSY  04/07/2020   Procedure: BIOPSY;  Surgeon: Eloise Harman, DO;  [facility]: [facility];  Service: Endoscopy;;  . Breast Cystectomy  Right   . CESAREAN SECTION    . COLONOSCOPY WITH PROPOFOL N/A 01/25/2020   Dr. Abbey Chatters: Nonbleeding internal hemorrhoids, diverticulosis, 5 mm polyp removed from the ascending colon, 10 mm polyp removed from the  sigmoid colon, 30 mm polyp (tubulovillous adenoma with no high-grade dysplasia) removed from the transverse colon via piecemeal status post tattoo.  Other polyps were tubular adenomas.  3 month surveillance colonoscopy recommended.  . COLONOSCOPY WITH PROPOFOL N/A 04/07/2020   Procedure: COLONOSCOPY WITH PROPOFOL;  Surgeon: Eloise Harman, DO;  Location: [facility];  Service: Endoscopy;  Laterality: N/A;  3:00pm, pt knows new time per office  . CYSTOSCOPY/URETEROSCOPY/HOLMIUM LASER/STENT PLACEMENT Bilateral 03/01/2019   Procedure: CYSTOSCOPY/RETROGRADEURETEROSCOPY/HOLMIUM LASER/STENT PLACEMENT;  Surgeon: Ceasar Mons, MD;  Location: [facility];  Service: Urology;  Laterality: Bilateral;  ONLY NEEDS 60 MIN  . ESOPHAGOGASTRODUODENOSCOPY (EGD) WITH PROPOFOL N/A 01/25/2020   Dr. Abbey Chatters: 4 columns grade 1 esophageal varices  . POLYPECTOMY  01/25/2020   Procedure: POLYPECTOMY;  Surgeon: Eloise Harman, DO;  [facility];  Service: Endoscopy;;  . POLYPECTOMY  04/07/2020   Procedure: POLYPECTOMY INTESTINAL;  Surgeon: Eloise Harman, DO;  [facility];  Service: Endoscopy;;  . SKIN CANCER EXCISION     Face  . SPINE SURGERY    . SUBMUCOSAL TATTOO INJECTION  01/25/2020   Procedure: SUBMUCOSAL TATTOO INJECTION;  Surgeon: Eloise Harman, DO;  [facility];  Service: Endoscopy;;   Family History:  Family History  Problem Relation Age of Onset  . Diabetes Father   . Heart disease Father 17       MI  . Hypertension Father   . Anemia Mother        Transfusion dependent  . COPD Sister   . Cancer Paternal Grandmother 85       Pancreatic   Social History:  Social History   Tobacco Use  . Smoking status: Former Smoker    Packs/day: 1.50    Years: 40.00    Pack years: 60.00    Types: Cigarettes    Quit date: 04/29/2015    Years since quitting: 5.0  . Smokeless tobacco: Never Used  . Tobacco comment: Quit smoking 04/2015- Previous 1.5 ppd smoker   Vaping Use  . Vaping Use: Never used  Substance Use Topics  . Alcohol use: No    Alcohol/week: 0.0 standard drinks  . Drug use: No    Home Medications:  Prior to Admission medications   Medication Sig Start Date End Date Taking? Authorizing Provider  Cholecalciferol (VITAMIN D) 50 MCG (2000 UT) tablet Take 4,000 Units by mouth daily.   Yes [provider]  ferrous sulfate 325 (65 FE) MG tablet Take 1 tablet (325 mg total) by mouth 2 (two) times daily with a meal. 01/26/20 03/26/20 Yes Memon, Jolaine Artist, MD  lisinopril (ZESTRIL) 10 MG tablet Take 1 tablet by mouth once daily Patient taking differently: Take 10 mg by mouth daily. 02/13/20  Yes Gottschalk, Leatrice Jewels M, DO  lovastatin (MEVACOR) 20 MG tablet Take 1 tablet (20 mg total) by mouth at bedtime. 10/30/19  Yes Ronnie Doss M, DO  metFORMIN (GLUCOPHAGE) 1000 MG tablet Take 1 tablet (1,000 mg total) by mouth 2 (two) times daily with a meal. 01/22/20  Yes [name], PA-C  oxyCODONE-acetaminophen (PERCOCET) 10-325 MG tablet Take 1 tablet by mouth every 6 (six) hours as needed for pain.   Yes [provider]  oxymetazoline (AFRIN) 0.05 % nasal spray Place 1 spray into both nostrils 2 (two) times daily as needed for congestion.   Yes [provider]  pantoprazole (PROTONIX) 40 MG tablet Take 1 tablet (40 mg total) by mouth daily. 04/07/20 10/04/20 Yes Carver, Elon Alas, DO  potassium chloride SA (KLOR-CON) 20 MEQ tablet Take 2 tablets (40 mEq total) by mouth 2 (two) times daily. 04/24/20  Yes Gottschalk, Leatrice Jewels M, DO  torsemide (DEMADEX) 20 MG tablet Take 3 tablets (60 mg total) by mouth 2 (two) times daily. Patient taking differently: Take 40 mg by mouth 2 (two) times daily. 02/18/20 05/18/20 Yes [name], PA-C  Blood Glucose Calibration (GLUCOMETER DEX HIGH CONTROL) LIQD Test CBG's once daily.     [provider]  Blood Glucose Monitoring Suppl (TRUE METRIX AIR GLUCOSE METER) w/Device KIT Check BS  daily Dx E11.9 10/02/19   Ronnie Doss M, DO  escitalopram (LEXAPRO) 20 MG tablet Take 1 tablet (20 mg total) by mouth daily. Patient not taking: No sig reported 10/22/19   Ronnie Doss M, DO  glucose blood (TRUE METRIX BLOOD GLUCOSE TEST) test strip Check BS daily Dx E11.9 10/02/19   Ronnie Doss M, DO  TRUEplus Lancets 30G MISC Check BS daily Dx E11.9 10/02/19   Ronnie Doss M, DO  albuterol (PROVENTIL HFA;VENTOLIN HFA) 108 (90 Base) MCG/ACT inhaler Inhale 2 puffs into the lungs every 6 (six) hours as needed for wheezing or shortness of breath. 09/21/16 09/22/16  Katheren Shams, DO    Inpatient Medications:  Current Facility-Administered Medications:  .  0.9 %  sodium chloride infusion (Manually program via Guardrails IV Fluids), , Intravenous, Once, Johnson, Clanford L, MD .  acetaminophen (TYLENOL) tablet 650 mg, 650 mg, Oral, Q6H PRN **OR** acetaminophen (TYLENOL) suppository 650 mg, 650 mg, Rectal, Q6H PRN, Reubin Milan, MD .  metoCLOPramide (REGLAN) injection 10 mg, 10 mg, Intravenous, Once, Johnson, Clanford L, MD .  [COMPLETED] octreotide (SANDOSTATIN) 2 mcg/mL load via infusion 100 mcg, 100 mcg, Intravenous, Once, 100 mcg at 05/17/20 0248 **AND** octreotide (SANDOSTATIN) 500 mcg in sodium chloride 0.9 % 250 mL (2 mcg/mL) infusion, 50 mcg/hr, Intravenous, Continuous, Reubin Milan, MD, Last Rate: 25 mL/hr at 05/17/20 0242, 50 mcg/hr at 05/17/20 0242 .  ondansetron (ZOFRAN) tablet 4 mg, 4 mg, Oral, Q6H PRN **OR** ondansetron (ZOFRAN) injection 4 mg, 4 mg, Intravenous, Q6H PRN, Reubin Milan, MD .  pantoprazole (PROTONIX) injection 40 mg, 40 mg, Intravenous, Q12H, Reubin Milan, MD, 40 mg at 05/17/20 1100  Current Outpatient Medications:  .  Cholecalciferol (VITAMIN D) 50 MCG (2000 UT) tablet, Take 4,000 Units by mouth daily., Disp: , Rfl:  .  ferrous sulfate 325 (65 FE) MG tablet, Take 1 tablet (325 mg total) by mouth 2 (two) times daily with a meal.,  Disp: 60 tablet, Rfl: 1 .  lisinopril (ZESTRIL) 10 MG tablet, Take 1 tablet by mouth once daily (Patient taking differently: Take 10 mg by mouth daily.), Disp: 90 tablet, Rfl: 0 .  lovastatin (MEVACOR) 20 MG tablet, Take 1 tablet (20 mg total) by mouth at bedtime., Disp: 90 tablet, Rfl: 3 .  metFORMIN (GLUCOPHAGE) 1000 MG tablet, Take 1 tablet (1,000 mg total) by mouth 2 (two) times daily with a meal., Disp: 180 tablet, Rfl: 3 .  oxyCODONE-acetaminophen (PERCOCET) 10-325 MG tablet, Take 1 tablet by mouth every 6 (six) hours as needed for pain., Disp: , Rfl:  .  oxymetazoline (AFRIN) 0.05 % nasal spray, Place 1 spray into both nostrils 2 (two) times daily as needed for congestion., Disp: , Rfl:  .  pantoprazole (PROTONIX) 40 MG tablet, Take 1 tablet (40 mg total) by mouth daily., Disp: 30 tablet, Rfl: 5 .  potassium chloride SA (KLOR-CON) 20 MEQ tablet, Take 2 tablets (40 mEq total) by mouth 2 (two) times daily., Disp: 360 tablet, Rfl: 3 .  torsemide (DEMADEX) 20 MG tablet, Take 3 tablets (60 mg total) by mouth 2 (two) times daily. (Patient taking differently: Take 40 mg by mouth 2 (two) times daily.), Disp: 180 tablet, Rfl: 11 .  Blood Glucose Calibration (GLUCOMETER DEX HIGH CONTROL) LIQD, Test CBG's once daily. , Disp: , Rfl:  .  Blood Glucose Monitoring Suppl (TRUE METRIX AIR GLUCOSE METER) w/Device KIT, Check BS daily Dx E11.9, Disp: 1 kit, Rfl: 0 .  escitalopram (LEXAPRO) 20 MG tablet, Take 1 tablet (20 mg total) by mouth daily. (Patient not taking: No sig reported), Disp: 90 tablet, Rfl: 0 .  glucose blood (TRUE METRIX BLOOD GLUCOSE TEST) test strip, Check BS daily Dx E11.9, Disp: 100 each, Rfl: 3 .  TRUEplus Lancets 30G MISC, Check BS daily Dx E11.9, Disp: 100 each, Rfl: 3 Allergies: Keflex [cephalexin]  Complete Review of Systems: GENERAL: negative for malaise, night sweats HEENT: No changes in hearing or vision, no nose bleeds or other nasal problems. NECK: Negative for lumps, goiter,  pain and significant neck swelling RESPIRATORY: Negative for cough, wheezing CARDIOVASCULAR: Negative for chest pain, leg swelling, palpitations, orthopnea GI: SEE HPI MUSCULOSKELETAL: Negative for joint pain or swelling, back pain, and muscle pain. SKIN: Negative for lesions, rash PSYCH: Negative for sleep disturbance, mood disorder and recent psychosocial stressors. HEMATOLOGY Negative for prolonged bleeding, bruising easily, and swollen nodes. ENDOCRINE: Negative for cold or heat intolerance, polyuria, polydipsia and goiter. NEURO: negative for tremor, gait imbalance, syncope and seizures. The remainder of the review of systems is noncontributory.  Physical Exam: BP 127/64   Pulse 85   Temp 98 F (36.7 C) (Oral)   Resp 14   Ht _0  (1.575 m)   Wt 131.5 kg   SpO2 100%   BMI 53.04 kg/m  GENERAL: The patient is AO x3, in no acute distress. Obese. Using . HEENT: Head is normocephalic and atraumatic. EOMI are intact. Mouth is well hydrated and without lesions. NECK: Supple. No masses LUNGS: Clear to auscultation. No presence of rhonchi/wheezing/rales. Adequate chest expansion HEART: RRR, normal s1 and s2. ABDOMEN: Soft, nontender, no guarding, no peritoneal signs, and nondistended. BS +. No masses. RECTAL EXAM: no external lesions, normal tone, no masses, brown stool without blood, no melena EXTREMITIES: Without any cyanosis, clubbing, rash, lesions or edema. NEUROLOGIC: AOx3, no focal motor deficit. SKIN: no jaundice, no rashes  Laboratory Data CBC:     Component Value Date/Time   WBC 5.6 05/17/2020 0524   RBC 3.17 (L) 05/17/2020 0524   HGB 7.7 (L) 05/17/2020 0524   HGB 8.8 (L) 03/25/2020 1357   HCT 27.5 (L) 05/17/2020 0524   HCT 29.8 (L) 03/25/2020 1357   PLT 116 (L) 05/17/2020 0524   PLT 142 (L) 03/25/2020 1357   MCV 86.8 05/17/2020  0524   MCV 84 03/25/2020 1357   MCH 24.3 (L) 05/17/2020 0524   MCHC 28.0 (L) 05/17/2020 0524   RDW 18.7 (H) 05/17/2020 0524   RDW  17.0 (H) 03/25/2020 1357   LYMPHSABS 1.6 03/20/2020 0411   MONOABS 0.7 03/20/2020 0411   EOSABS 0.1 03/20/2020 0411   BASOSABS 0.0 03/20/2020 0411   COAG:  Lab Results  Component Value Date   INR 1.1 05/17/2020   INR 1.0 03/19/2020   INR 1.1 01/22/2020    BMP:  BMP Latest Ref Rng & Units 05/17/2020 05/16/2020 03/19/2020  Glucose 70 - 99 mg/dL 115(H) 119(H) 165(H)  BUN 8 - 23 mg/dL _0 Creatinine 0.44 - 1.00 mg/dL 0.68 0.57 0.73  BUN/Creat Ratio 12 - 28 - - -  Sodium 135 - 145 mmol/L 136 137 136  Potassium 3.5 - 5.1 mmol/L 4.1 3.4(L) 3.8  Chloride 98 - 111 mmol/L 102 101 97(L)  CO2 22 - 32 mmol/L _1 Calcium 8.9 - 10.3 mg/dL 8.1(L) 8.6(L) 8.2(L)    HEPATIC:  Hepatic Function Latest Ref Rng & Units 05/17/2020 05/16/2020 03/19/2020  Total Protein 6.5 - 8.1 g/dL 7.8 7.7 8.0  Albumin 3.5 - 5.0 g/dL 2.7(L) 2.8(L) 2.8(L)  AST 15 - 41 U/L _2 ALT 0 - 44 U/L _3 Alk Phosphatase 38 - 126 U/L 82 83 86  Total Bilirubin 0.3 - 1.2 mg/dL 0.3 0.3 0.4    CARDIAC: No results found for: CKTOTAL, CKMB, CKMBINDEX, TROPONINI   Imaging: I personally reviewed and interpreted the available imaging.  Assessment & Plan: 70 y.o. female with history of adrenal adenoma, depression, anxiety, diabetes, grade 2 diastolic heart failure, hyperlipidemia, hypertension, obesity, on chronic oxygen supplementation for unknown etiology, iron deficiency anemia, large colonic polyps, NASH cirrhosis with presence of grade 1 esophageal varices, who came to the hospital after presenting worsening fatigue and was found to have severe anemia.  Patient has presented hemodynamic stability since admission and adequately responded to transfusion of 2 units of PRBC.  However, she is not yet optimized as her hemoglobin is less than eight given her history of heart failure.  She is not presenting any symptoms of overt gastrointestinal bleeding.  I suspect her current presentation is likely related to bleeding  from her small bowel but given her drop in her hemoglobin she warrants an endoscopic evaluation with an EGD during this admission.  I would recommend giving her 1 unit of PRBC and continue her on PPI twice daily.  She can continue with the octreotide infusion but I think it is less likely she has a variceal bleeding.  Given the concern of variceal bleeding by the primary team, will recommend ceftriaxone prophylaxis until an active bleeding from an upper GI source has been ruled out.  # Severe symptomatic anemia # NASH cirrhosis - Transfuse 1 UPRBC and repeat CBC qday, transfuse if Hb <7 - Pantoprazole 40 mg q12h IVP - 2 large bore IV lines - Active T/S - CLD today, keep NPO after MN - Avoid NSAIDs - Can continue octreotide for now - Start ceftriaxone PPX until upper GI source has been ruled out - Will proceed with EGD tomorrow - Patient may require outpatient capsule endoscopy if workup is negative  Harvel Quale, MD Gastroenterology and Hepatology [facility]

## 2020-05-18 ENCOUNTER — Observation Stay (HOSPITAL_COMMUNITY): Payer: Medicare PPO | Admitting: Anesthesiology

## 2020-05-18 ENCOUNTER — Encounter (HOSPITAL_COMMUNITY): Payer: Self-pay | Admitting: Internal Medicine

## 2020-05-18 ENCOUNTER — Encounter (HOSPITAL_COMMUNITY)
Admission: EM | Disposition: A | Discharge status: Home / Self Care | Payer: Self-pay | Source: Home / Self Care | Attending: Emergency Medicine

## 2020-05-18 DIAGNOSIS — D62 Acute posthemorrhagic anemia: Secondary | ICD-10-CM | POA: Diagnosis not present

## 2020-05-18 DIAGNOSIS — Z20822 Contact with and (suspected) exposure to covid-19: Secondary | ICD-10-CM | POA: Diagnosis not present

## 2020-05-18 DIAGNOSIS — E119 Type 2 diabetes mellitus without complications: Secondary | ICD-10-CM | POA: Diagnosis not present

## 2020-05-18 DIAGNOSIS — K921 Melena: Secondary | ICD-10-CM | POA: Diagnosis not present

## 2020-05-18 DIAGNOSIS — Z7984 Long term (current) use of oral hypoglycemic drugs: Secondary | ICD-10-CM | POA: Diagnosis not present

## 2020-05-18 DIAGNOSIS — D500 Iron deficiency anemia secondary to blood loss (chronic): Secondary | ICD-10-CM | POA: Diagnosis not present

## 2020-05-18 DIAGNOSIS — J449 Chronic obstructive pulmonary disease, unspecified: Secondary | ICD-10-CM | POA: Diagnosis not present

## 2020-05-18 DIAGNOSIS — I5032 Chronic diastolic (congestive) heart failure: Secondary | ICD-10-CM | POA: Diagnosis not present

## 2020-05-18 DIAGNOSIS — Z79899 Other long term (current) drug therapy: Secondary | ICD-10-CM | POA: Diagnosis not present

## 2020-05-18 DIAGNOSIS — I152 Hypertension secondary to endocrine disorders: Secondary | ICD-10-CM | POA: Diagnosis not present

## 2020-05-18 DIAGNOSIS — I110 Hypertensive heart disease with heart failure: Secondary | ICD-10-CM | POA: Diagnosis not present

## 2020-05-18 DIAGNOSIS — E876 Hypokalemia: Secondary | ICD-10-CM | POA: Diagnosis not present

## 2020-05-18 DIAGNOSIS — E1159 Type 2 diabetes mellitus with other circulatory complications: Secondary | ICD-10-CM

## 2020-05-18 DIAGNOSIS — Z87891 Personal history of nicotine dependence: Secondary | ICD-10-CM | POA: Diagnosis not present

## 2020-05-18 DIAGNOSIS — E440 Moderate protein-calorie malnutrition: Secondary | ICD-10-CM | POA: Diagnosis not present

## 2020-05-18 DIAGNOSIS — I8500 Esophageal varices without bleeding: Secondary | ICD-10-CM | POA: Diagnosis not present

## 2020-05-18 HISTORY — PX: ESOPHAGOGASTRODUODENOSCOPY (EGD) WITH PROPOFOL: SHX5813

## 2020-05-18 HISTORY — PX: GIVENS CAPSULE STUDY: SHX5432

## 2020-05-18 LAB — GLUCOSE, CAPILLARY
Glucose-Capillary: 116 mg/dL — ABNORMAL HIGH (ref 70–99)
Glucose-Capillary: 116 mg/dL — ABNORMAL HIGH (ref 70–99)

## 2020-05-18 LAB — BPAM RBC
Blood Product Expiration Date: 202203132359
Blood Product Expiration Date: 202203202359
ISSUE DATE / TIME: 202202191419
ISSUE DATE / TIME: 202202201301
Unit Type and Rh: 600
Unit Type and Rh: 6200

## 2020-05-18 LAB — CBC
HCT: 26.2 % — ABNORMAL LOW (ref 36.0–46.0)
Hemoglobin: 7.4 g/dL — ABNORMAL LOW (ref 12.0–15.0)
MCH: 24.8 pg — ABNORMAL LOW (ref 26.0–34.0)
MCHC: 28.2 g/dL — ABNORMAL LOW (ref 30.0–36.0)
MCV: 87.9 fL (ref 80.0–100.0)
Platelets: 103 10*3/uL — ABNORMAL LOW (ref 150–400)
RBC: 2.98 MIL/uL — ABNORMAL LOW (ref 3.87–5.11)
RDW: 18.6 % — ABNORMAL HIGH (ref 11.5–15.5)
WBC: 4.9 10*3/uL (ref 4.0–10.5)
nRBC: 0 % (ref 0.0–0.2)

## 2020-05-18 LAB — TYPE AND SCREEN
ABO/RH(D): A POS
Antibody Screen: NEGATIVE
Unit division: 0
Unit division: 0

## 2020-05-18 LAB — HEMOGLOBIN AND HEMATOCRIT, BLOOD
HCT: 27.3 % — ABNORMAL LOW (ref 36.0–46.0)
Hemoglobin: 7.6 g/dL — ABNORMAL LOW (ref 12.0–15.0)

## 2020-05-18 SURGERY — ESOPHAGOGASTRODUODENOSCOPY (EGD) WITH PROPOFOL
Anesthesia: General

## 2020-05-18 SURGERY — IMAGING PROCEDURE, GI TRACT, INTRALUMINAL, VIA CAPSULE

## 2020-05-18 MED ORDER — NADOLOL 20 MG PO TABS
20.0000 mg | ORAL_TABLET | Freq: Every day | ORAL | Status: DC
  Administered 2020-05-19: 20 mg via ORAL
  Filled 2020-05-18: qty 1

## 2020-05-18 MED ORDER — LACTATED RINGERS IV SOLN: INTRAVENOUS | Status: DC

## 2020-05-18 MED ORDER — PROPOFOL 10 MG/ML IV BOLUS
INTRAVENOUS | Status: DC | PRN
  Administered 2020-05-18 (×2): 50 mg via INTRAVENOUS
  Administered 2020-05-18: 100 mg via INTRAVENOUS
  Administered 2020-05-18: 50 mg via INTRAVENOUS

## 2020-05-18 MED ORDER — SODIUM CHLORIDE 0.9 % IV SOLN: INTRAVENOUS | Status: DC

## 2020-05-18 MED ORDER — LIDOCAINE HCL (CARDIAC) PF 100 MG/5ML IV SOSY
PREFILLED_SYRINGE | INTRAVENOUS | Status: DC | PRN
  Administered 2020-05-18: 100 mg via INTRAVENOUS

## 2020-05-18 MED ORDER — GLYCOPYRROLATE 0.2 MG/ML IJ SOLN
INTRAMUSCULAR | Status: DC | PRN
  Administered 2020-05-18 (×2): .1 mg via INTRAVENOUS

## 2020-05-18 MED ORDER — STERILE WATER FOR IRRIGATION IR SOLN
Status: DC | PRN
  Administered 2020-05-18: 1.5 mL

## 2020-05-18 NOTE — Op Note (Signed)
[facility] Patient Name: [facility] Procedure Date: 05/18/2020 12:12 PM MRN: [id number] Date of Birth: 12-Dec-1950 Attending MD: Elon Alas. Edgar Frisk CSN: [id number] Age: 70 Admit Type: Inpatient Procedure:                Upper GI endoscopy Indications:              Iron deficiency anemia secondary to chronic blood                            loss Providers:                Elon Alas. Abbey Chatters, DO, Caprice Kluver, Randa Spike,                            Technician Referring MD:              Medicines:                See the Anesthesia note for documentation of the                            administered medications Complications:            No immediate complications. Estimated Blood Loss:     Estimated blood loss: none. Procedure:                Pre-Anesthesia Assessment:                           - The anesthesia plan was to use monitored                            anesthesia care (MAC).                           After obtaining informed consent, the endoscope was                            passed under direct vision. Throughout the                            procedure, the patient's blood pressure, pulse, and                            oxygen saturations were monitored continuously. The                            [id number]-[id number] ([id number]) scope was introduced through the                            mouth, and advanced to the second part of duodenum.                            The upper GI endoscopy was accomplished without                            difficulty. The patient tolerated the procedure  well. Scope In: 12:32:04 PM Scope Out: 12:36:23 PM Total Procedure Duration: 0 hours 4 minutes 19 seconds  Findings:      Four columns of grade I varices with no bleeding and no stigmata of       recent bleeding were found in the lower third of the esophagus,. No red       wale signs were present.      The entire examined stomach was normal.      The  duodenal bulb, first portion of the duodenum and second portion of       the duodenum were normal. Impression:               - Grade I esophageal varices with no bleeding and                            no stigmata of recent bleeding.                           - Normal stomach.                           - Normal duodenal bulb, first portion of the                            duodenum and second portion of the duodenum.                           - No specimens collected. Moderate Sedation:      Per Anesthesia Care Recommendation:           - Return patient to hospital ward for ongoing care.                           - Clear liquid diet.                           - Will proceed with capsule endoscopy as definite                            source of anemia not found on today's EGD.                           - Start on beta blocker for primary ppx. for                            esophageal varices. Procedure Code(s):        --- Professional ---                           [phone], Esophagogastroduodenoscopy, flexible,                            transoral; diagnostic, including collection of                            specimen(s) by brushing or washing, when performed                            (  separate procedure) Diagnosis Code(s):        --- Professional ---                           I85.00, Esophageal varices without bleeding                           D50.0, Iron deficiency anemia secondary to blood                            loss (chronic) CPT copyright 2019 [employer]. All rights reserved. The codes documented in this report are preliminary and upon coder review may  be revised to meet current compliance requirements. Elon Alas. Abbey Chatters, DO [place] Abbey Chatters, DO 05/18/2020 12:45:19 PM This report has been signed electronically. Number of Addenda: 0

## 2020-05-18 NOTE — Progress Notes (Signed)
PROGRESS NOTE    Savannah Burns  MWU:[id number] DOB: 11-07-1950 DOA: 05/16/2020 PCP: [name], DO    Brief Narrative:  Savannah Burns is a 70yo female with a history of known esophageal varices, colonic polyps, adrenal adenoma, anxiety, depression, panic disorder, T2DM, and HFpEF who presented with 1 week of generalized weakness/fatigue and multiple episodes of melena. She was found to have a Hgb of 6.1 and is now s/p 2u PRBC with a Hgb 7.4. Scheduled for upper endoscopy with propofol this afternoon.    Assessment & Plan:   Principal Problem:   Melena Active Problems:   Diabetes mellitus, type 2 (HCC)   Class 3 obesity   Depression, recurrent (HCC)   COPD (chronic obstructive pulmonary disease) (HCC)   Chronic diastolic HF (heart failure) (HCC)   Hypertension associated with diabetes ([place])   ABLA (acute blood loss anemia)   Hyperlipidemia   Hypokalemia   Hypoalbuminemia   Moderate protein malnutrition (HCC)    ABLA (acute blood loss anemia)  Hgb 6.8>7.7>7.4 despite being s/p PRBC x2. Likely indicative of ongoing blood loss.  - Upper endoscopy this afternoon - Afternoon H&H; transfuse as needed - Continue octreotide for now; re-evaluate s/p scope - Continue PPI - Continue SBP ppx    Hypokalemia- resolved K 3.4>4.1.    Diabetes mellitus, type 2 (HCC) Currently NPO. Glucoses 115-133.  - Hold Metformin. - CBG monitoring every 6 hours while NPO.    Depression, recurrent ([place]) - Continue escitalopram 20 mg p.o. daily once cleared for oral intake.    COPD (chronic obstructive pulmonary disease) (HCC) - Continue supplemental oxygen. - Bronchodilators as needed.    Chronic diastolic HF (heart failure) (HCC) Compensated at this time. - Strict I/O. - Resume torsemide once cleared for oral intake.    Hypertension associated with diabetes ([place]) Normotensive in hospital.  - Hold home lisinopril    Hyperlipidemia - Resume lovastatin once cleared for  oral intake.    Moderate protein malnutrition (HCC)   Hypoalbuminemia - Referral to nutrition.    Class 3 obesity Increases risk of metabolic and cardiac disease.  - Follow-up with PCP.    DVT prophylaxis: SCDs Start: 05/16/20 2254  Code Status: Full Family Communication: Patient in contact with family  Disposition Plan: Status is: Observation  The patient remains OBS appropriate and will d/c before 2 midnights.  Dispo:  Patient From: Home  Planned Disposition: Home  Expected discharge date: 05/19/2020  Medically stable for discharge: No        Consultants:   GI; Nutrition  Procedures:   EGD 05/18/20  Antimicrobials:   Rocephin for SBP ppx     Subjective: Savannah Burns reports that she's "been better." Denies any further incidence of melena and no hemoptysis. Amenable to scope today. Discussed the likelihood that she will need to remain in hospital for another day or two.   Objective: Vitals:   05/17/20 1520 05/17/20 2048 05/18/20 0147 05/18/20 0539  BP: 140/70 (!) 114/54 125/65 (!) 140/56  Pulse: 81 90 85 80  Resp: 20 19 18 19   Temp: 99.4 F (37.4 C) 97.9 F (36.6 C) 98 F (36.7 C) 98.2 F (36.8 C)  TempSrc: Oral  Oral   SpO2: 100% 98% 99% 100%  Weight: 131.5 kg     Height: 5' 2"  (1.575 m)       Intake/Output Summary (Last 24 hours) at 05/18/2020 1027 Last data filed at 05/17/2020 1520 Gross per 24 hour  Intake 1128.06 ml  Output -  Net 1128.06 ml   Filed Weights   05/16/20 1158 05/17/20 1520  Weight: 131.5 kg 131.5 kg    Examination:  Physical Exam Vitals reviewed.  Constitutional:      General: She is not in acute distress.    Appearance: She is obese.  Cardiovascular:     Rate and Rhythm: Normal rate and regular rhythm.  Pulmonary:     Comments: Normal WOB on 4L [place] Musculoskeletal:        General: No swelling or deformity.  Neurological:     General: No focal deficit present.     Mental Status: She is alert.  Psychiatric:         Mood and Affect: Mood normal.        Behavior: Behavior normal.        Data Reviewed: I have personally reviewed following labs and imaging studies  CBC: Recent Labs  Lab 05/16/20 1206 05/16/20 2001 05/17/20 0524 05/18/20 0604  WBC 6.1 6.1 5.6 4.9  HGB 6.8* 7.4* 7.7* 7.4*  HCT 24.7* 26.1* 27.5* 26.2*  MCV 85.5 85.9 86.8 87.9  PLT 133* 117* 116* 294*   Basic Metabolic Panel: Recent Labs  Lab 05/16/20 1206 05/17/20 0524  NA 137 136  K 3.4* 4.1  CL 101 102  CO2 30 30  GLUCOSE 119* 115*  BUN 10 12  CREATININE 0.57 0.68  CALCIUM 8.6* 8.1*  MG  --  1.8  PHOS  --  4.9*   GFR: Estimated Creatinine Clearance: 85.4 mL/min (by C-G formula based on SCr of 0.68 mg/dL). Liver Function Tests: Recent Labs  Lab 05/16/20 1206 05/17/20 0524  AST 19 23  ALT 14 14  ALKPHOS 83 82  BILITOT 0.3 0.3  PROT 7.7 7.8  ALBUMIN 2.8* 2.7*   No results for input(s): LIPASE, AMYLASE in the last 168 hours. No results for input(s): AMMONIA in the last 168 hours. Coagulation Profile: Recent Labs  Lab 05/17/20 0532  INR 1.1   Cardiac Enzymes: No results for input(s): CKTOTAL, CKMB, CKMBINDEX, TROPONINI in the last 168 hours. BNP (last 3 results) Recent Labs    05/29/19 1105  PROBNP 58.0   HbA1C: No results for input(s): HGBA1C in the last 72 hours. CBG: Recent Labs  Lab 05/17/20 1234 05/17/20 1647 05/17/20 2340 05/18/20 0541  GLUCAP 125* 133* 99 116*   Lipid Profile: No results for input(s): CHOL, HDL, LDLCALC, TRIG, CHOLHDL, LDLDIRECT in the last 72 hours. Thyroid Function Tests: No results for input(s): TSH, T4TOTAL, FREET4, T3FREE, THYROIDAB in the last 72 hours. Anemia Panel: No results for input(s): VITAMINB12, FOLATE, FERRITIN, TIBC, IRON, RETICCTPCT in the last 72 hours. Sepsis Labs: No results for input(s): PROCALCITON, LATICACIDVEN in the last 168 hours.  Recent Results (from the past 240 hour(s))  SARS CORONAVIRUS 2 (TAT 6-24 HRS) Nasopharyngeal  Nasopharyngeal Swab     Status: None   Collection Time: 05/16/20 10:41 PM   Specimen: Nasopharyngeal Swab  Result Value Ref Range Status   SARS Coronavirus 2 NEGATIVE NEGATIVE Final    Comment: (NOTE) SARS-CoV-2 target nucleic acids are NOT DETECTED.  The SARS-CoV-2 RNA is generally detectable in upper and lower respiratory specimens during the acute phase of infection. Negative results do not preclude SARS-CoV-2 infection, do not rule out co-infections with other pathogens, and should not be used as the sole basis for treatment or other patient management decisions. Negative results must be combined with clinical observations, patient history, and epidemiological information. The expected result is Negative.  Fact  Sheet for Patients: SugarRoll.be  Fact Sheet for Healthcare Providers: https://www.woods-mathews.com/  This test is not yet approved or cleared by the [place] FDA and  has been authorized for detection and/or diagnosis of SARS-CoV-2 by FDA under an Emergency Use Authorization (EUA). This EUA will remain  in effect (meaning this test can be used) for the duration of the COVID-19 declaration under Se ction 564(b)(1) of the Act, 21 U.S.C. section 360bbb-3(b)(1), unless the authorization is terminated or revoked sooner.  Performed at [facility], [place] [address] [place], [place] [address]   SARS Coronavirus 2 by RT PCR (hospital order, performed in [facility] hospital lab) Nasopharyngeal Nasopharyngeal Swab     Status: None   Collection Time: 05/17/20 10:21 AM   Specimen: Nasopharyngeal Swab  Result Value Ref Range Status   SARS Coronavirus 2 NEGATIVE NEGATIVE Final    Comment: (NOTE) SARS-CoV-2 target nucleic acids are NOT DETECTED.  The SARS-CoV-2 RNA is generally detectable in upper and lower respiratory specimens during the acute phase of infection. The lowest concentration of SARS-CoV-2 viral copies this  assay can detect is 250 copies / mL. A negative result does not preclude SARS-CoV-2 infection and should not be used as the sole basis for treatment or other patient management decisions.  A negative result may occur with improper specimen collection / handling, submission of specimen other than nasopharyngeal swab, presence of viral mutation(s) within the areas targeted by this assay, and inadequate number of viral copies (<250 copies / mL). A negative result must be combined with clinical observations, patient history, and epidemiological information.  Fact Sheet for Patients:   StrictlyIdeas.no  Fact Sheet for Healthcare Providers: BankingDealers.co.za  This test is not yet approved or  cleared by the [place] FDA and has been authorized for detection and/or diagnosis of SARS-CoV-2 by FDA under an Emergency Use Authorization (EUA).  This EUA will remain in effect (meaning this test can be used) for the duration of the COVID-19 declaration under Section 564(b)(1) of the Act, 21 U.S.C. section 360bbb-3(b)(1), unless the authorization is terminated or revoked sooner.  Performed at [facility], [address] [place], [place] [address]          Radiology Studies: No results found.      Scheduled Meds: . pantoprazole (PROTONIX) IV  40 mg Intravenous Q12H   Continuous Infusions: . cefTRIAXone (ROCEPHIN)  IV 1 g (05/17/20 1218)  . octreotide  (SANDOSTATIN)    IV infusion 50 mcg/hr (05/17/20 2330)     LOS: 0 days    Time spent: 35 minutes   Pearla Dubonnet, Medical Student [facility]   If 7PM-7AM, please contact night-coverage [email or web address]  05/18/2020, 10:27 AM

## 2020-05-18 NOTE — Anesthesia Preprocedure Evaluation (Signed)
Anesthesia Evaluation  Patient identified by MRN, date of birth, ID band Patient awake    Reviewed: Allergy & Precautions, H&P , NPO status , Patient's Chart, lab work & pertinent test results, reviewed documented beta blocker date and time   Airway Mallampati: II  TM Distance: >3 FB Neck ROM: full    Dental no notable dental hx.    Pulmonary neg pulmonary ROS, shortness of breath, pneumonia, COPD, former smoker,    Pulmonary exam normal breath sounds clear to auscultation       Cardiovascular Exercise Tolerance: Good hypertension, +CHF  negative cardio ROS   Rhythm:regular Rate:Normal     Neuro/Psych PSYCHIATRIC DISORDERS Anxiety Depression negative neurological ROS  negative psych ROS   GI/Hepatic negative GI ROS, Neg liver ROS,   Endo/Other  negative endocrine ROSdiabetes, Type 2  Renal/GU negative Renal ROS  negative genitourinary   Musculoskeletal  (+) Arthritis ,   Abdominal   Peds  Hematology negative hematology ROS (+) Blood dyscrasia, anemia ,   Anesthesia Other Findings   Reproductive/Obstetrics negative OB ROS                             Anesthesia Physical Anesthesia Plan  ASA: III and emergent  Anesthesia Plan: General   Post-op Pain Management:    Induction:   PONV Risk Score and Plan: Propofol infusion  Airway Management Planned:   Additional Equipment:   Intra-op Plan:   Post-operative Plan:   Informed Consent: I have reviewed the patients History and Physical, chart, labs and discussed the procedure including the risks, benefits and alternatives for the proposed anesthesia with the patient or authorized representative who has indicated his/her understanding and acceptance.     Dental Advisory Given  Plan Discussed with: CRNA  Anesthesia Plan Comments:         Anesthesia Quick Evaluation

## 2020-05-18 NOTE — Anesthesia Postprocedure Evaluation (Signed)
Anesthesia Post Note  Patient: Savannah Burns  Procedure(s) Performed: ESOPHAGOGASTRODUODENOSCOPY (EGD) WITH PROPOFOL (N/A )  Patient location during evaluation: Endoscopy Anesthesia Type: General Level of consciousness: awake and alert Pain management: pain level controlled Vital Signs Assessment: post-procedure vital signs reviewed and stable Respiratory status: spontaneous breathing, nonlabored ventilation, respiratory function stable and patient connected to nasal cannula oxygen Cardiovascular status: blood pressure returned to baseline and stable Postop Assessment: no apparent nausea or vomiting Anesthetic complications: no   No complications documented.   Last Vitals:  Vitals:   05/18/20 1139 05/18/20 1242  BP: (!) 151/57 124/63  Pulse:  99  Resp: (!) 22 (!) 24  Temp: 37.3 C 37.1 C  SpO2: 99% 96%    Last Pain:  Vitals:   05/18/20 1242  TempSrc: Oral  PainSc: 0-No pain                 Adair Patter Wrenly Lauritsen

## 2020-05-18 NOTE — Interval H&P Note (Signed)
History and Physical Interval Note:  05/18/2020 12:22 PM  [name]  has presented today for surgery, with the diagnosis of severe anemia.  The various methods of treatment have been discussed with the patient and family. After consideration of risks, benefits and other options for treatment, the patient has consented to  Procedure(s): ESOPHAGOGASTRODUODENOSCOPY (EGD) WITH PROPOFOL (N/A) as a surgical intervention.  The patient's history has been reviewed, patient examined, no change in status, stable for surgery.  I have reviewed the patient's chart and labs.  Questions were answered to the patient's satisfaction.     Eloise Harman

## 2020-05-18 NOTE — Transfer of Care (Signed)
Immediate Anesthesia Transfer of Care Note  Patient: Savannah Burns  Procedure(s) Performed: ESOPHAGOGASTRODUODENOSCOPY (EGD) WITH PROPOFOL (N/A )  Patient Location: PACU  Anesthesia Type:MAC  Level of Consciousness: awake, alert  and oriented  Airway & Oxygen Therapy: Patient Spontanous Breathing and Patient connected to nasal cannula oxygen  Post-op Assessment: Report given to RN, Post -op Vital signs reviewed and stable and Patient moving all extremities X 4  Post vital signs: Reviewed and stable  Last Vitals:  Vitals Value Taken Time  BP    Temp    Pulse    Resp    SpO2      Last Pain:  Vitals:   05/18/20 1139  TempSrc: Oral  PainSc: 0-No pain      Patients Stated Pain Goal: 7 (12/92/90 9030)  Complications: No complications documented.

## 2020-05-18 NOTE — Care Management Obs Status (Signed)
[place] NOTIFICATION   Patient Details  Name: Savannah Burns MRN: [id number] Date of Birth: October 08, 1950   Medicare Observation Status Notification Given:  Yes    Tommy Medal 05/18/2020, 10:23 AM

## 2020-05-18 NOTE — Progress Notes (Signed)
EGD with Propofol by Dr. Abbey Chatters planned for today. Hgb 7.4, down from 7.7 yesterday. Admitting Hgb 6.8. Total of 2 units PRBCs this admission. Patient is NPO. No melena noted. Denies fatigue, weakness. Discussed risks and benefits again with patient, who states understanding.   Continue on PPI BID, Rocephin, and octreotide. Further recommendations after EGD.   Annitta Needs, PhD, ANP-BC [facility]

## 2020-05-19 ENCOUNTER — Observation Stay (HOSPITAL_COMMUNITY): Payer: Medicare PPO

## 2020-05-19 ENCOUNTER — Telehealth: Payer: Self-pay | Admitting: Gastroenterology

## 2020-05-19 ENCOUNTER — Other Ambulatory Visit: Payer: Self-pay | Admitting: Gastroenterology

## 2020-05-19 DIAGNOSIS — R0602 Shortness of breath: Secondary | ICD-10-CM | POA: Diagnosis not present

## 2020-05-19 DIAGNOSIS — E440 Moderate protein-calorie malnutrition: Secondary | ICD-10-CM

## 2020-05-19 DIAGNOSIS — J449 Chronic obstructive pulmonary disease, unspecified: Secondary | ICD-10-CM | POA: Diagnosis not present

## 2020-05-19 DIAGNOSIS — E876 Hypokalemia: Secondary | ICD-10-CM

## 2020-05-19 DIAGNOSIS — K7581 Nonalcoholic steatohepatitis (NASH): Secondary | ICD-10-CM | POA: Diagnosis not present

## 2020-05-19 DIAGNOSIS — I8500 Esophageal varices without bleeding: Secondary | ICD-10-CM

## 2020-05-19 DIAGNOSIS — K921 Melena: Secondary | ICD-10-CM | POA: Diagnosis not present

## 2020-05-19 DIAGNOSIS — T189XXA Foreign body of alimentary tract, part unspecified, initial encounter: Secondary | ICD-10-CM | POA: Diagnosis not present

## 2020-05-19 DIAGNOSIS — D500 Iron deficiency anemia secondary to blood loss (chronic): Secondary | ICD-10-CM

## 2020-05-19 DIAGNOSIS — E1159 Type 2 diabetes mellitus with other circulatory complications: Secondary | ICD-10-CM | POA: Diagnosis not present

## 2020-05-19 DIAGNOSIS — I152 Hypertension secondary to endocrine disorders: Secondary | ICD-10-CM | POA: Diagnosis not present

## 2020-05-19 DIAGNOSIS — I517 Cardiomegaly: Secondary | ICD-10-CM | POA: Diagnosis not present

## 2020-05-19 DIAGNOSIS — I5032 Chronic diastolic (congestive) heart failure: Secondary | ICD-10-CM | POA: Diagnosis not present

## 2020-05-19 DIAGNOSIS — R531 Weakness: Secondary | ICD-10-CM | POA: Diagnosis not present

## 2020-05-19 DIAGNOSIS — J811 Chronic pulmonary edema: Secondary | ICD-10-CM | POA: Diagnosis not present

## 2020-05-19 DIAGNOSIS — D649 Anemia, unspecified: Secondary | ICD-10-CM | POA: Diagnosis not present

## 2020-05-19 DIAGNOSIS — I10 Essential (primary) hypertension: Secondary | ICD-10-CM | POA: Diagnosis not present

## 2020-05-19 DIAGNOSIS — D62 Acute posthemorrhagic anemia: Secondary | ICD-10-CM | POA: Diagnosis not present

## 2020-05-19 DIAGNOSIS — K7460 Unspecified cirrhosis of liver: Secondary | ICD-10-CM | POA: Diagnosis not present

## 2020-05-19 LAB — COMPREHENSIVE METABOLIC PANEL
ALT: 15 U/L (ref 0–44)
AST: 25 U/L (ref 15–41)
Albumin: 2.9 g/dL — ABNORMAL LOW (ref 3.5–5.0)
Alkaline Phosphatase: 82 U/L (ref 38–126)
Anion gap: 6 (ref 5–15)
BUN: 10 mg/dL (ref 8–23)
CO2: 28 mmol/L (ref 22–32)
Calcium: 8.1 mg/dL — ABNORMAL LOW (ref 8.9–10.3)
Chloride: 100 mmol/L (ref 98–111)
Creatinine, Ser: 0.64 mg/dL (ref 0.44–1.00)
GFR, Estimated: >60 mL/min (ref >60)
Glucose, Bld: 123 mg/dL — ABNORMAL HIGH (ref 70–99)
Potassium: 3.6 mmol/L (ref 3.5–5.1)
Sodium: 134 mmol/L — ABNORMAL LOW (ref 135–145)
Total Bilirubin: 0.6 mg/dL (ref 0.3–1.2)
Total Protein: 8.1 g/dL (ref 6.5–8.1)

## 2020-05-19 LAB — GLUCOSE, CAPILLARY
Glucose-Capillary: 122 mg/dL — ABNORMAL HIGH (ref 70–99)
Glucose-Capillary: 139 mg/dL — ABNORMAL HIGH (ref 70–99)

## 2020-05-19 LAB — CBC
HCT: 29.1 % — ABNORMAL LOW (ref 36.0–46.0)
Hemoglobin: 8.2 g/dL — ABNORMAL LOW (ref 12.0–15.0)
MCH: 24.8 pg — ABNORMAL LOW (ref 26.0–34.0)
MCHC: 28.2 g/dL — ABNORMAL LOW (ref 30.0–36.0)
MCV: 88.2 fL (ref 80.0–100.0)
Platelets: 111 10*3/uL — ABNORMAL LOW (ref 150–400)
RBC: 3.3 MIL/uL — ABNORMAL LOW (ref 3.87–5.11)
RDW: 19.2 % — ABNORMAL HIGH (ref 11.5–15.5)
WBC: 6.4 10*3/uL (ref 4.0–10.5)
nRBC: 0 % (ref 0.0–0.2)

## 2020-05-19 LAB — HEMOGLOBIN AND HEMATOCRIT, BLOOD
HCT: 27.1 % — ABNORMAL LOW (ref 36.0–46.0)
Hemoglobin: 7.8 g/dL — ABNORMAL LOW (ref 12.0–15.0)

## 2020-05-19 MED ORDER — NADOLOL 20 MG PO TABS: 20.0000 mg | ORAL_TABLET | Freq: Every day | ORAL | 3 refills | Status: DC

## 2020-05-19 MED ORDER — PRAVASTATIN SODIUM 10 MG PO TABS: 20.0000 mg | ORAL_TABLET | Freq: Every day | ORAL | Status: DC

## 2020-05-19 MED ORDER — POTASSIUM CHLORIDE CRYS ER 20 MEQ PO TBCR
40.0000 meq | EXTENDED_RELEASE_TABLET | Freq: Two times a day (BID) | ORAL | Status: DC
  Administered 2020-05-19: 40 meq via ORAL
  Filled 2020-05-19: qty 2

## 2020-05-19 MED ORDER — TORSEMIDE 20 MG PO TABS: 40.0000 mg | ORAL_TABLET | Freq: Two times a day (BID) | ORAL | Status: DC

## 2020-05-19 MED ORDER — TORSEMIDE 20 MG PO TABS
40.0000 mg | ORAL_TABLET | Freq: Two times a day (BID) | ORAL | Status: DC
  Administered 2020-05-19: 40 mg via ORAL
  Filled 2020-05-19: qty 2

## 2020-05-19 NOTE — Discharge Summary (Addendum)
Physician Discharge Summary  ARIETTA EISENSTEIN KDX:[id number] DOB: 08-12-1950 DOA: 05/16/2020  PCP: [name], DO  Admit date: 05/16/2020 Discharge date: 05/19/2020  Admitted From: Home Disposition:  Home  Recommendations for Outpatient Follow-up:  1. Follow up with PCP in 1-2 weeks 2. Follow up with GI on 4/6 as scheduled 3. Outpatient EGD with capsule placement by GI    Home Health: No needs Equipment/Devices: DME oxygen, which the patient already has  Discharge Condition: Ambulatory, requiring 3L O2 CODE STATUS: Full  Diet recommendation: Heart Healthy Diet  Brief/Interim Summary: Ms. Haskew [facility] with several weeks of generalized weakness/fatigue and melena. She was found to have a hemoglobin of 6.8. She received two units of PRBC and her hemoglobin gradually improved to 8.2. EGD revealed grade I esophageal varices but no signs of active or recent bleeding. Stomach and duodenal bulb were also normal. Capsule Endoscopy was also performed with results pending at the time of discharge. Patient denied any further episodes of melena and was deemed stable for discharge home.   Discharge Diagnoses:  Principal Problem:   Melena Active Problems:   Diabetes mellitus, type 2 (HCC)   Class 3 obesity   Depression, recurrent (HCC)   COPD (chronic obstructive pulmonary disease) (HCC)   Chronic diastolic HF (heart failure) (HCC)   Hypertension associated with diabetes ([place])   ABLA (acute blood loss anemia)   Hyperlipidemia   Hypokalemia   Hypoalbuminemia   Moderate protein malnutrition (HCC)  Acute blood loss anemia No identified source of bleeding. Capsule endoscopy pending at time of discharge as capsule has not passed. - Continue iron supplementation - Continue home omeprazole - Follow-up with GI as already arranged  Chronic diastolic heart failure - Continue home torsemide 60 mg BID with potassium supplement - Heart healthy diet  - Supplemental O2 as  needed   Discharge Instructions   Allergies as of 05/19/2020      Reactions   Keflex [cephalexin] Nausea And Vomiting      Medication List    TAKE these medications   ferrous sulfate 325 (65 FE) MG tablet Take 1 tablet (325 mg total) by mouth 2 (two) times daily with a meal.   Glucometer Dex High Control Liqd Test CBG's once daily.   lisinopril 10 MG tablet Commonly known as: ZESTRIL Take 1 tablet by mouth once daily   lovastatin 20 MG tablet Commonly known as: MEVACOR Take 1 tablet (20 mg total) by mouth at bedtime.   metFORMIN 1000 MG tablet Commonly known as: GLUCOPHAGE Take 1 tablet (1,000 mg total) by mouth 2 (two) times daily with a meal.   nadolol 20 MG tablet Commonly known as: Corgard Take 1 tablet (20 mg total) by mouth daily.   oxyCODONE-acetaminophen 10-325 MG tablet Commonly known as: PERCOCET Take 1 tablet by mouth every 6 (six) hours as needed for pain.   oxymetazoline 0.05 % nasal spray Commonly known as: AFRIN Place 1 spray into both nostrils 2 (two) times daily as needed for congestion.   pantoprazole 40 MG tablet Commonly known as: Protonix Take 1 tablet (40 mg total) by mouth daily.   potassium chloride SA 20 MEQ tablet Commonly known as: KLOR-CON Take 2 tablets (40 mEq total) by mouth 2 (two) times daily.   torsemide 20 MG tablet Commonly known as: DEMADEX Take 3 tablets (60 mg total) by mouth 2 (two) times daily. What changed: how much to take   True Metrix Air Glucose Meter w/Device Kit Check  BS daily Dx E11.9   True Metrix Blood Glucose Test test strip Generic drug: glucose blood Check BS daily Dx E11.9   TRUEplus Lancets 30G Misc Check BS daily Dx E11.9   Vitamin D 50 MCG (2000 UT) tablet Take 4,000 Units by mouth daily.       Follow-up Information    Ronnie Doss M, DO. Schedule an appointment as soon as possible for a visit in 1 week(s).   Specialty: Family Medicine Contact information: [address]  [place] [address] [phone]        Arnoldo Lenis, MD .   Specialty: Cardiology Contact information: [address] [place] [address] [phone]        Eloise Harman, DO. Schedule an appointment as soon as possible for a visit in 2 week(s).   Specialty: Internal Medicine Why: Hospital Follow Up  Contact information: [place] [address] [phone]              Allergies  Allergen Reactions  . Keflex [Cephalexin] Nausea And Vomiting    Consultations:  GI  Procedures/Studies: DG CHEST PORT 1 VIEW  Result Date: 05/19/2020 CLINICAL DATA:  Sudden onset shortness of breath, low hemoglobin, generalized weakness and multiple episodes of melena EXAM: PORTABLE CHEST 1 VIEW COMPARISON:  Radiograph 01/22/2020, CT 06/10/2019 FINDINGS: Some chronically coarsened interstitial changes and punctate micro nodularity compatible with innumerable calcified micro nodules seen on comparison CT. There is some increasing pulmonary vascular congestion and hazy interstitial opacity in the lungs as well as central pulmonary artery prominence which could reflect underlying pulmonary artery hypertension. Suspect some mild cardiomegaly though may be accentuated by portable technique. The aorta is calcified. The remaining cardiomediastinal contours are unremarkable. Obscuration of the left hemidiaphragm could reflect some basilar airspace disease or effusion. No right effusion. No pneumothorax. The osseous structures appear diffusely demineralized which may limit detection of small or nondisplaced fractures. No acute osseous or soft tissue abnormality. Telemetry leads overlie the chest. IMPRESSION: 1. Increasing pulmonary vascular congestion and hazy interstitial opacity in the lungs which could reflect developing edema. 2. Obscuration of the left hemidiaphragm could reflect some additional basilar airspace disease or effusion. 3. Prominence of the cardiac silhouette, possibly  accentuated by portable technique. 4. Chronic interstitial changes and punctate micro nodularity compatible with innumerable calcified micro nodules seen on comparison CT. 5.  Aortic Atherosclerosis (ICD10-I70.0). Electronically Signed   By: Lovena Le M.D.   On: 05/19/2020 01:34   DG Abd 2 Views  Result Date: 05/19/2020 CLINICAL DATA:  Assess location of ingested capsule swallowed on 02/21. EXAM: ABDOMEN - 2 VIEW COMPARISON:  CT abdomen pelvis April 03, 2020 FINDINGS: The bowel gas pattern is normal. There is no evidence of free air. Radiopaque metallic density in the right lower quadrant which likely represents the ingested capsule. Lower lumbar fixation hardware. IMPRESSION: Radiopaque metallic density in the right lower quadrant likely within the cecum/proximal ascending colon and representing the ingested capsule. Electronically Signed   By: Dahlia Bailiff MD   On: 05/19/2020 15:51    Subjective: Pt without complaints, reports no black or blood stools.  Tolerating diet with no difficulty.   Discharge Exam: Vitals:   05/19/20 0041 05/19/20 0451 05/19/20 0921 05/19/20 1050  BP:  134/71 (!) 152/83   Pulse:  75 74 80  Resp:  18 18   Temp:  98.2 F (36.8 C)    TempSrc:  Oral    SpO2: 92% 98% 99% 97%  Weight:      Height:        Physical Exam Vitals reviewed.  Constitutional:      Appearance: She is obese.  Pulmonary:     Comments: Increased WOB with activity on 3L [place]. Bibasilar crackles appreciated on exam. Skin:    General: Skin is warm and dry.     Coloration: Skin is not pale.  Neurological:     General: No focal deficit present.  Psychiatric:        Mood and Affect: Mood normal.        Behavior: Behavior normal.    The results of significant diagnostics from this hospitalization (including imaging, microbiology, ancillary and laboratory) are listed below for reference.     Microbiology: Recent Results (from the past 240 hour(s))  SARS CORONAVIRUS 2 (TAT 6-24 HRS)  Nasopharyngeal Nasopharyngeal Swab     Status: None   Collection Time: 05/16/20 10:41 PM   Specimen: Nasopharyngeal Swab  Result Value Ref Range Status   SARS Coronavirus 2 NEGATIVE NEGATIVE Final    Comment: (NOTE) SARS-CoV-2 target nucleic acids are NOT DETECTED.  The SARS-CoV-2 RNA is generally detectable in upper and lower respiratory specimens during the acute phase of infection. Negative results do not preclude SARS-CoV-2 infection, do not rule out co-infections with other pathogens, and should not be used as the sole basis for treatment or other patient management decisions. Negative results must be combined with clinical observations, patient history, and epidemiological information. The expected result is Negative.  Fact Sheet for Patients: SugarRoll.be  Fact Sheet for Healthcare Providers: https://www.woods-mathews.com/  This test is not yet approved or cleared by the [place] FDA and  has been authorized for detection and/or diagnosis of SARS-CoV-2 by FDA under an Emergency Use Authorization (EUA). This EUA will remain  in effect (meaning this test can be used) for the duration of the COVID-19 declaration under Se ction 564(b)(1) of the Act, 21 U.S.C. section 360bbb-3(b)(1), unless the authorization is terminated or revoked sooner.  Performed at [facility], [place] [address] [place], [place] [address]   SARS Coronavirus 2 by RT PCR (hospital order, performed in [facility] hospital lab) Nasopharyngeal Nasopharyngeal Swab     Status: None   Collection Time: 05/17/20 10:21 AM   Specimen: Nasopharyngeal Swab  Result Value Ref Range Status   SARS Coronavirus 2 NEGATIVE NEGATIVE Final    Comment: (NOTE) SARS-CoV-2 target nucleic acids are NOT DETECTED.  The SARS-CoV-2 RNA is generally detectable in upper and lower respiratory specimens during the acute phase of infection. The lowest concentration of SARS-CoV-2 viral  copies this assay can detect is 250 copies / mL. A negative result does not preclude SARS-CoV-2 infection and should not be used as the sole basis for treatment or other patient management decisions.  A negative result may occur with improper specimen collection / handling, submission of specimen other than nasopharyngeal swab, presence of viral mutation(s) within the areas targeted by this assay, and inadequate number of viral copies (<250 copies / mL). A negative result must be combined with clinical observations, patient history, and epidemiological information.  Fact Sheet for Patients:   StrictlyIdeas.no  Fact Sheet for Healthcare Providers: BankingDealers.co.za  This test is not yet approved or  cleared by the [place] FDA and has been authorized for detection and/or diagnosis of SARS-CoV-2 by FDA under an Emergency Use Authorization (EUA).  This EUA will remain in effect (meaning this test can be used) for the duration of  the COVID-19 declaration under Section 564(b)(1) of the Act, 21 U.S.C. section 360bbb-3(b)(1), unless the authorization is terminated or revoked sooner.  Performed at [facility], [address] [place], [place] [address]      Labs: BNP (last 3 results) Recent Labs    01/22/20 1457 01/22/20 1655  BNP 118.0* 258.5*   Basic Metabolic Panel: Recent Labs  Lab 05/16/20 1206 05/17/20 0524 05/19/20 0647  NA 137 136 134*  K 3.4* 4.1 3.6  CL 101 102 100  CO2 30 30 28   GLUCOSE 119* 115* 123*  BUN 10 12 10   CREATININE 0.57 0.68 0.64  CALCIUM 8.6* 8.1* 8.1*  MG  --  1.8  --   PHOS  --  4.9*  --    Liver Function Tests: Recent Labs  Lab 05/16/20 1206 05/17/20 0524 05/19/20 0647  AST 19 23 25   ALT 14 14 15   ALKPHOS 83 82 82  BILITOT 0.3 0.3 0.6  PROT 7.7 7.8 8.1  ALBUMIN 2.8* 2.7* 2.9*   No results for input(s): LIPASE, AMYLASE in the last 168 hours. No results for input(s): AMMONIA in  the last 168 hours. CBC: Recent Labs  Lab 05/16/20 1206 05/16/20 2001 05/17/20 0524 05/18/20 0604 05/18/20 1814 05/19/20 0050 05/19/20 0647  WBC 6.1 6.1 5.6 4.9  --   --  6.4  HGB 6.8* 7.4* 7.7* 7.4* 7.6* 7.8* 8.2*  HCT 24.7* 26.1* 27.5* 26.2* 27.3* 27.1* 29.1*  MCV 85.5 85.9 86.8 87.9  --   --  88.2  PLT 133* 117* 116* 103*  --   --  111*   Cardiac Enzymes: No results for input(s): CKTOTAL, CKMB, CKMBINDEX, TROPONINI in the last 168 hours. BNP: Invalid input(s): POCBNP CBG: Recent Labs  Lab 05/17/20 2340 05/18/20 0541 05/18/20 1144 05/19/20 0028 05/19/20 0449  GLUCAP 99 116* 116* 139* 122*   D-Dimer No results for input(s): DDIMER in the last 72 hours. Hgb A1c No results for input(s): HGBA1C in the last 72 hours. Lipid Profile No results for input(s): CHOL, HDL, LDLCALC, TRIG, CHOLHDL, LDLDIRECT in the last 72 hours. Thyroid function studies No results for input(s): TSH, T4TOTAL, T3FREE, THYROIDAB in the last 72 hours.  Invalid input(s): FREET3 Anemia work up No results for input(s): VITAMINB12, FOLATE, FERRITIN, TIBC, IRON, RETICCTPCT in the last 72 hours. Urinalysis    Component Value Date/Time   COLORURINE STRAW (A) 01/22/2020 1725   APPEARANCEUR Cloudy (A) 02/07/2020 0000   LABSPEC 1.005 01/22/2020 1725   PHURINE 8.0 01/22/2020 1725   GLUCOSEU Negative 02/07/2020 0000   HGBUR LARGE (A) 01/22/2020 1725   HGBUR negative 07/23/2007 1049   BILIRUBINUR Negative 02/07/2020 0000   KETONESUR NEGATIVE 01/22/2020 1725   PROTEINUR 2+ (A) 02/07/2020 0000   PROTEINUR NEGATIVE 01/22/2020 1725   UROBILINOGEN 0.2 04/30/2015 1205   UROBILINOGEN 0.2 07/23/2007 1049   NITRITE Negative 02/07/2020 0000   NITRITE NEGATIVE 01/22/2020 1725   LEUKOCYTESUR Negative 02/07/2020 0000   LEUKOCYTESUR NEGATIVE 01/22/2020 1725   Sepsis Labs Invalid input(s): PROCALCITONIN,  WBC,  LACTICIDVEN Microbiology Recent Results (from the past 240 hour(s))  SARS CORONAVIRUS 2 (TAT 6-24  HRS) Nasopharyngeal Nasopharyngeal Swab     Status: None   Collection Time: 05/16/20 10:41 PM   Specimen: Nasopharyngeal Swab  Result Value Ref Range Status   SARS Coronavirus 2 NEGATIVE NEGATIVE Final    Comment: (NOTE) SARS-CoV-2 target nucleic acids are NOT DETECTED.  The SARS-CoV-2 RNA is generally detectable in upper and lower respiratory specimens during the acute  phase of infection. Negative results do not preclude SARS-CoV-2 infection, do not rule out co-infections with other pathogens, and should not be used as the sole basis for treatment or other patient management decisions. Negative results must be combined with clinical observations, patient history, and epidemiological information. The expected result is Negative.  Fact Sheet for Patients: SugarRoll.be  Fact Sheet for Healthcare Providers: https://www.woods-mathews.com/  This test is not yet approved or cleared by the [place] FDA and  has been authorized for detection and/or diagnosis of SARS-CoV-2 by FDA under an Emergency Use Authorization (EUA). This EUA will remain  in effect (meaning this test can be used) for the duration of the COVID-19 declaration under Se ction 564(b)(1) of the Act, 21 U.S.C. section 360bbb-3(b)(1), unless the authorization is terminated or revoked sooner.  Performed at [facility], [place] [address] [place], [place] [address]   SARS Coronavirus 2 by RT PCR (hospital order, performed in [facility] hospital lab) Nasopharyngeal Nasopharyngeal Swab     Status: None   Collection Time: 05/17/20 10:21 AM   Specimen: Nasopharyngeal Swab  Result Value Ref Range Status   SARS Coronavirus 2 NEGATIVE NEGATIVE Final    Comment: (NOTE) SARS-CoV-2 target nucleic acids are NOT DETECTED.  The SARS-CoV-2 RNA is generally detectable in upper and lower respiratory specimens during the acute phase of infection. The lowest concentration of SARS-CoV-2  viral copies this assay can detect is 250 copies / mL. A negative result does not preclude SARS-CoV-2 infection and should not be used as the sole basis for treatment or other patient management decisions.  A negative result may occur with improper specimen collection / handling, submission of specimen other than nasopharyngeal swab, presence of viral mutation(s) within the areas targeted by this assay, and inadequate number of viral copies (<250 copies / mL). A negative result must be combined with clinical observations, patient history, and epidemiological information.  Fact Sheet for Patients:   StrictlyIdeas.no  Fact Sheet for Healthcare Providers: BankingDealers.co.za  This test is not yet approved or  cleared by the [place] FDA and has been authorized for detection and/or diagnosis of SARS-CoV-2 by FDA under an Emergency Use Authorization (EUA).  This EUA will remain in effect (meaning this test can be used) for the duration of the COVID-19 declaration under Section 564(b)(1) of the Act, 21 U.S.C. section 360bbb-3(b)(1), unless the authorization is terminated or revoked sooner.  Performed at [facility], [address], [place],  [address]    Time coordinating discharge: 35 mins  SIGNED:  Irwin Brakeman, Medical Student  [facility] 05/19/2020, 4:33 PM  Patient seen and examined with Pearla Dubonnet, Medical student. In addition to supervising the encounter, I played a key role in the decision making process as well as reviewed key findings.   I agree with above findings.  I have spoken with GI team and patient can discharge today. They will make arrangements for outpatient GI follow up.  She will resume home oral iron supplement.  She reports that she feels well.  Resume her home oral torsemide 60 mg BID with potassium supplement. Outpatient follow up with cardiology.   Murvin Natal MD How to contact the [facility]  Attending or Consulting provider [place] or covering provider during after hours [place], for this patient?  1. Check the care team in [facility] and look for a) attending/consulting [facility] provider listed and b) the [facility] team listed 2. Log into [email or web address] and use [place]'s universal password  to access. If you do not have the password, please contact the hospital operator. 3. Locate the [facility] provider you are looking for under [facility] and page to a number that you can be directly reached. 4. If you still have difficulty reaching the provider, please page the [facility] (Director on Call) for the Hospitalists listed on amion for assistance.

## 2020-05-19 NOTE — Progress Notes (Signed)
Brief progress note:   Patient had Givens capsule placed yesterday and was read today. Capsule stayed in the stomach for 7 hours. Very little of the small bowel was seen. No significant lesions or bleeding seen on visualized portion of small bowel. No overt bleeding during the entire study. Couple of tiny erosions in the stomach. Separate full capsule op note to follow. Query whether anesthesia may have influenced gastric emptying.   She has had a BM today, but didn't pass the capsule. Reports having melena back in October 2021, but hasn't had any since then.No brbpr. No abdominal pain, nausea, or vomiting. Her hemoglobin has improved to 8.2 today.  Notably, she had not been taking her Protonix as prescribed at home. She is aware her Protonix has been increased to twice daily and states she has now organized her home meds and will be taking everything as prescribed.   Etiology of worsening IDA is still not clear. She needs complete imaging of her small bowel. May have intermittent oozing from [facility].    I spoke with Dr. Jenetta Downer who states patient is ok to go home. We will obtain abdominal x-ray today to identify capsule location. Dr. Jenetta Downer recommended repeat in 1 week if capsule is retained as long as she doesn't develop any obstructive symptoms. Also recommended repeat EGD with Givens Capsule placement. Spoke with Dr. Abbey Chatters, her primary GI provider, who is agreeable to repeat EGD with capsule placement once current capsule has passed.   Plan:  1.  DG abdomen today.  2.  Continue Protonix 40 mg BID.  3.  Continue oral iron BID.  4.  Continue nadolol 20 mg daily. Will likely need up titration in the future as HR is 80 today. F/U outpatient.  5.  Requested patient keep a log of HR and BP at home.  6.  Ok to discontinue antibiotics at discharge as patient has had no overt GI bleeding and no evidence of upper GI bleed this admission.  7.  Patient is to monitor for overt GI bleeding and return to  the emergency room if this occurs.  8.  Repeat H/H in 1 week.  9.  Arrange EGD with Givens Capsule placement once current capsule has passed.  10. Patient already has OV scheduled for 4/6.

## 2020-05-19 NOTE — Discharge Instructions (Signed)
Gastrointestinal Bleeding Gastrointestinal (GI) bleeding is bleeding somewhere along the path that food travels through the body (digestive tract). This path is anywhere between the mouth and the opening of the butt (anus). You may have blood in your poop (stool) or have black poop. If you throw up (vomit), there may be blood in it. This condition can be mild, serious, or even life-threatening. If you have a lot of bleeding, you may need to stay in the hospital. What are the causes? This condition may be caused by:  Irritation and swelling of the esophagus (esophagitis). The esophagus is part of the body that moves food from your mouth to your stomach.  Swollen veins in the butt (hemorrhoids).  Areas of painful tearing in the opening of the butt (anal fissures). These are often caused by passing hard poop.  Pouches that form on the colon over time (diverticulosis).  Irritation and swelling (diverticulitis) in areas where pouches have formed on the colon.  Growths (polyps) or cancer. Colon cancer often starts out as growths that are not cancer.  Irritation of the stomach lining (gastritis).  Sores (ulcers) in the stomach. What increases the risk? You are more likely to develop this condition if you:  Have a certain type of infection in your stomach (Helicobacter pylori infection).  Take certain medicines.  Smoke.  Drink alcohol. What are the signs or symptoms? Common symptoms of this condition include:  Throwing up (vomiting) material that has bright red blood in it. It may look like coffee grounds.  Changes in your poop. The poop may: ? Have red blood in it. ? Be black, look like tar, and smell stronger than normal. ? Be red.  Pain or cramping in the belly (abdomen). How is this treated? Treatment for this condition depends on the cause of the bleeding. For example:  Sometimes, the bleeding can be stopped during a procedure that is done to find the problem (endoscopy  or colonoscopy).  Medicines can be used to: ? Help control irritation, swelling, or infection. ? Reduce acid in your stomach.  Certain problems can be treated with: ? Creams. ? Medicines that are put in the butt (suppositories). ? Warm baths.  Surgery is sometimes needed.  If you lose a lot of blood, you may need a blood transfusion. If bleeding is mild, you may be allowed to go home. If there is a lot of bleeding, you will need to stay in the hospital. Follow these instructions at home:  Take over-the-counter and prescription medicines only as told by your doctor.  Eat foods that have a lot of fiber in them. These foods include beans, whole grains, and fresh fruits and vegetables. You can also try eating 1-3 prunes each day.  Drink enough fluid to keep your pee (urine) pale yellow.  Keep all follow-up visits as told by your doctor. This is important.   Contact a doctor if:  Your symptoms do not get better. Get help right away if:  Your bleeding does not stop.  You feel dizzy or you pass out (faint).  You feel weak.  You have very bad cramps in your back or belly.  You pass large clumps of blood (clots) in your poop.  Your symptoms are getting worse.  You have chest pain or fast heartbeats. Summary  GI bleeding is bleeding somewhere along the path that food travels through the body (digestive tract).  This bleeding can be caused by many things. Treatment depends on the cause of the  bleeding.  Take medicines only as told by your doctor.  Keep all follow-up visits as told by your doctor. This is important. This information is not intended to replace advice given to you by your health care provider. Make sure you discuss any questions you have with your health care provider. Document Revised: 10/25/2017 Document Reviewed: 10/25/2017 Elsevier Patient Education  2021 Elsevier Inc.    IMPORTANT INFORMATION: PAY CLOSE ATTENTION   PHYSICIAN DISCHARGE  INSTRUCTIONS  Follow with Primary care provider  [name], DO  and other consultants as instructed by your Hospitalist Physician  [place] IF SYMPTOMS COME BACK, WORSEN OR NEW PROBLEM DEVELOPS   Please note: You were cared for by a hospitalist during your hospital stay. Every effort will be made to forward records to your primary care provider.  You can request that your primary care provider send for your hospital records if they have not received them.  Once you are discharged, your primary care physician will handle any further medical issues. Please note that NO REFILLS for any discharge medications will be authorized once you are discharged, as it is imperative that you return to your primary care physician (or establish a relationship with a primary care physician if you do not have one) for your post hospital discharge needs so that they can reassess your need for medications and monitor your lab values.  Please get a complete blood count and chemistry panel checked by your Primary MD at your next visit, and again as instructed by your Primary MD.  Get Medicines reviewed and adjusted: Please take all your medications with you for your next visit with your Primary MD  Laboratory/radiological data: Please request your Primary MD to go over all hospital tests and procedure/radiological results at the follow up, please ask your primary care provider to get all Hospital records sent to his/her office.  In some cases, they will be blood work, cultures and biopsy results pending at the time of your discharge. Please request that your primary care provider follow up on these results.  If you are diabetic, please bring your blood sugar readings with you to your follow up appointment with primary care.    Please call and make your follow up appointments as soon as possible.    Also Note the following: If you experience worsening of your admission  symptoms, develop shortness of breath, life threatening emergency, suicidal or homicidal thoughts you must seek medical attention immediately by calling 911 or calling your MD immediately  if symptoms less severe.  You must read complete instructions/literature along with all the possible adverse reactions/side effects for all the Medicines you take and that have been prescribed to you. Take any new Medicines after you have completely understood and accpet all the possible adverse reactions/side effects.   Do not drive when taking Pain medications or sleeping medications (Benzodiazepines)  Do not take more than prescribed Pain, Sleep and Anxiety Medications. It is not advisable to combine anxiety,sleep and pain medications without talking with your primary care practitioner  Special Instructions: If you have smoked or chewed Tobacco  in the last 2 yrs please stop smoking, stop any regular Alcohol  and or any Recreational drug use.  Wear Seat belts while driving.  Do not drive if taking any narcotic, mind altering or controlled substances or recreational drugs or alcohol.

## 2020-05-19 NOTE — Progress Notes (Signed)
Received call from RN that patient was SOB and would I come look at her.  Looked patient up and saw that her Hbg was low earlier today.  RN informed me that patient stated that one MD told her she had COPD and another MD told her she did not.  RN changed her flow meter to 2L from 4L and patient stated that she felt better afterwards and was able to get her breath better.  Patient is currently sitting in recliner and trying to rest.  Will continue to monitor.

## 2020-05-19 NOTE — Telephone Encounter (Signed)
Dena: Patient is discharging from hospital today. She needs CBC and KUB in 1 week. Please arrange. Dx: IDA, follow-up retained Givens Capsule.

## 2020-05-19 NOTE — Procedures (Signed)
Small Bowel Givens Capsule Study Procedure date:  05/18/2020  Referring Provider:  Irwin Brakeman, MD PCP:  Dr. [name], DO  Indication for procedure:  IDA  Findings: Patient swallowed capsule, which reached to the gastric chamber but remained in the stomach for 7:40 hours.  There was scant erythema in the gastric lining.  The capsule eventually emptied in the small bowel but only 20 minutes could be recorded.  No lesions were seen in the small bowel.  First Gastric image:  00:00:45 First Duodenal image: 07:40:19 First Ileo-Cecal Valve image: Capsule did not reach cecum First Cecal image: Capsule did not reach cecum Gastric Passage time: 07h 9mSmall Bowel Passage time:  Could not be assessed     Recommendations: -Repeat capsule endoscopy deployment via esophagogastroduodenospy - to be performed as outpatient -Continue oral iron supplementation upon discharge -Check H&H in 1 week -We will check a KUB today and if capsule is still present in the bowel, will need to repeat 1 in 1 week -Outpatient follow-up in GI clinic  I personally communicated these recommendations to the patient  DMaylon Peppers MD Gastroenterology and Hepatology [facility]

## 2020-05-20 ENCOUNTER — Other Ambulatory Visit: Payer: Self-pay

## 2020-05-20 ENCOUNTER — Telehealth: Payer: Self-pay | Admitting: *Deleted

## 2020-05-20 DIAGNOSIS — D500 Iron deficiency anemia secondary to blood loss (chronic): Secondary | ICD-10-CM

## 2020-05-20 NOTE — Telephone Encounter (Signed)
Noted. Please make patient aware.

## 2020-05-20 NOTE — Telephone Encounter (Signed)
Orders put in to [facility], Lab form to be mailed to the pt today.   2. Routed to [place] to put in the order for KUB

## 2020-05-20 NOTE — Telephone Encounter (Signed)
noted 

## 2020-05-20 NOTE — Telephone Encounter (Signed)
Transition Care Management Unsuccessful Follow-up Telephone Call  Date of discharge and from where:  05/19/20 - [facility]  Attempts:  1st Attempt  Reason for unsuccessful TCM follow-up call:  Left voice message

## 2020-05-20 NOTE — Telephone Encounter (Signed)
Order placed for KUB. No appt needed. Patient can go in 1 week anytime to radiology

## 2020-05-20 NOTE — Telephone Encounter (Signed)
Noted   Phoned and advised the pt that a lab form for a CBC will be mailed to her today for her to have done in one week.

## 2020-05-20 NOTE — Addendum Note (Signed)
Addended by: Cheron Every on: 05/20/2020 10:07 AM   Modules accepted: Orders

## 2020-05-21 ENCOUNTER — Encounter (HOSPITAL_COMMUNITY): Payer: Self-pay | Admitting: Gastroenterology

## 2020-05-21 NOTE — Telephone Encounter (Signed)
Contact Date: 05/21/2020 Contacted By: Faylene Million, LPN  Transition Care Management Follow-up Telephone Call  Date of discharge and from where: 05/19/20 from [facility]  Discharge Diagnosis: Melena  How have you been since you were released from the hospital? Fine per patient  Any questions or concerns? No   Items Reviewed:  Did the pt receive and understand the discharge instructions provided? Yes   Medications obtained and verified? Yes   Any new allergies since your discharge? No   Dietary orders reviewed? Yes  Do you have support at home? Yes   Discontinued Medications none New Medications Added Coorgard  Current Medication List Allergies as of 05/20/2020      Reactions   Keflex [cephalexin] Nausea And Vomiting      Medication List       Accurate as of May 20, 2020 11:59 PM. If you have any questions, ask your nurse or doctor.        ferrous sulfate 325 (65 FE) MG tablet Take 1 tablet (325 mg total) by mouth 2 (two) times daily with a meal.   Glucometer Dex High Control Liqd Test CBG's once daily.   lisinopril 10 MG tablet Commonly known as: ZESTRIL Take 1 tablet by mouth once daily   lovastatin 20 MG tablet Commonly known as: MEVACOR Take 1 tablet (20 mg total) by mouth at bedtime.   metFORMIN 1000 MG tablet Commonly known as: GLUCOPHAGE Take 1 tablet (1,000 mg total) by mouth 2 (two) times daily with a meal.   nadolol 20 MG tablet Commonly known as: Corgard Take 1 tablet (20 mg total) by mouth daily.   oxyCODONE-acetaminophen 10-325 MG tablet Commonly known as: PERCOCET Take 1 tablet by mouth every 6 (six) hours as needed for pain.   oxymetazoline 0.05 % nasal spray Commonly known as: AFRIN Place 1 spray into both nostrils 2 (two) times daily as needed for congestion.   pantoprazole 40 MG tablet Commonly known as: Protonix Take 1 tablet (40 mg total) by mouth daily.   potassium chloride SA 20 MEQ tablet Commonly known as:  KLOR-CON Take 2 tablets (40 mEq total) by mouth 2 (two) times daily.   torsemide 20 MG tablet Commonly known as: DEMADEX Take 3 tablets (60 mg total) by mouth 2 (two) times daily. What changed: how much to take   True Metrix Air Glucose Meter w/Device Kit Check BS daily Dx E11.9   True Metrix Blood Glucose Test test strip Generic drug: glucose blood Check BS daily Dx E11.9   TRUEplus Lancets 30G Misc Check BS daily Dx E11.9   Vitamin D 50 MCG (2000 UT) tablet Take 4,000 Units by mouth daily.        Home Care and Equipment/Supplies: Were home health services ordered? no If so, what is the name of the agency?   Has the agency set up a time to come to the patient's home? not applicable Were any new equipment or medical supplies ordered?  No What is the name of the medical supply agency?  Were you able to get the supplies/equipment? not applicable Do you have any questions related to the use of the equipment or supplies? No  Functional Questionnaire: (I = Independent and D = Dependent) ADLs: I  Bathing/Dressing- I  Meal Prep- D  Eating- I  Maintaining continence- I  Transferring/Ambulation- I  Managing Meds- I  Follow up appointments reviewed:   PCP Hospital f/u appt confirmed? Yes  Scheduled to see Dr. Lajuana Ripple  on March 8 @  2:00 PM  [facility] f/u appt confirmed?   Are transportation arrangements needed? No   If their condition worsens, is the pt aware to call PCP or go to the Emergency Dept.? Yes  Was the patient provided with contact information for the PCP's office or ED? Yes  Was to pt encouraged to call back with questions or concerns? Yes Pt was unable to make an appt earlier because car is in shop. She states that she would not show up even if we made it sooner.

## 2020-05-22 ENCOUNTER — Encounter (HOSPITAL_COMMUNITY): Payer: Self-pay | Admitting: Internal Medicine

## 2020-05-22 NOTE — Telephone Encounter (Signed)
Appt made

## 2020-05-22 NOTE — Telephone Encounter (Signed)
There is no appt scheduled to see me 3/8.

## 2020-05-25 ENCOUNTER — Telehealth: Payer: Self-pay | Admitting: *Deleted

## 2020-05-25 ENCOUNTER — Telehealth: Payer: Medicare PPO | Admitting: *Deleted

## 2020-05-25 DIAGNOSIS — J449 Chronic obstructive pulmonary disease, unspecified: Secondary | ICD-10-CM | POA: Diagnosis not present

## 2020-05-25 NOTE — Telephone Encounter (Signed)
  Chronic Care Management   Outreach Note  05/25/2020 Name: LASHAE WOLLENBERG MRN: [id number] DOB: 01/26/1951  Referred by: [name], DO Reason for referral : Chronic Care Management (Unsuccessful Outreach Attempt)   A first unsuccessful follow-up Telephone Visit was attempted today. The patient was referred to the case management team for assistance with care management and care coordination.   Clinical Goals: . Over the next 30 days, patient will be contacted by a Care Guide to reschedule their CCM Visit  Interventions and Plan . Chart reviewed in preparation for telephone visit . Collaboration with other care team members as needed . A HIPAA compliant phone message was left for the patient providing contact information and requesting a return call.  . Request sent to care guides to reach out and reschedule patient's telephone visit   [name], BSN, RN-BC [place] [facility] [place] Management Direct Dial: [phone]

## 2020-05-27 ENCOUNTER — Ambulatory Visit (INDEPENDENT_AMBULATORY_CARE_PROVIDER_SITE_OTHER): Payer: Medicare PPO | Admitting: Licensed Clinical Social Worker

## 2020-05-27 DIAGNOSIS — I10 Essential (primary) hypertension: Secondary | ICD-10-CM

## 2020-05-27 DIAGNOSIS — J449 Chronic obstructive pulmonary disease, unspecified: Secondary | ICD-10-CM

## 2020-05-27 DIAGNOSIS — E119 Type 2 diabetes mellitus without complications: Secondary | ICD-10-CM

## 2020-05-27 DIAGNOSIS — E1169 Type 2 diabetes mellitus with other specified complication: Secondary | ICD-10-CM

## 2020-05-27 DIAGNOSIS — F339 Major depressive disorder, recurrent, unspecified: Secondary | ICD-10-CM

## 2020-05-27 DIAGNOSIS — E669 Obesity, unspecified: Secondary | ICD-10-CM

## 2020-05-27 NOTE — Chronic Care Management (AMB) (Signed)
Chronic Care Management    Clinical Social Work Follow Up Note  05/27/2020 Name: Savannah Burns MRN: [id number] DOB: [birth year]  Savannah Burns is a 70 y.o. year old female who is a primary care patient of [name], DO. The CCM team was consulted for assistance with [employer] .   Review of patient status, including review of consultants reports, other relevant assessments, and collaboration with appropriate care team members and the patient's provider was performed as part of comprehensive patient evaluation and provision of chronic care management services.    SDOH (Social Determinants of Health) assessments performed: No; risk for depression; risk for tobacco use; risk for stress; risk for physical inactivity  Flowsheet Row Chronic Care Management from 12/05/2019 in [facility]  PHQ-9 Total Score 6      GAD 7 : Generalized Anxiety Score 12/05/2019 12/22/2017  Nervous, Anxious, on Edge 1 1  Control/stop worrying 1 3  Worry too much - different things 1 1  Trouble relaxing 0 1  Restless 0 0  Easily annoyed or irritable 1 1  Afraid - awful might happen 0 3  Total GAD 7 Score 4 10  Anxiety Difficulty Somewhat difficult Somewhat difficult    Outpatient Encounter Medications as of 05/27/2020  Medication Sig  . Blood Glucose Calibration (GLUCOMETER DEX HIGH CONTROL) LIQD Test CBG's once daily.   . Blood Glucose Monitoring Suppl (TRUE METRIX AIR GLUCOSE METER) w/Device KIT Check BS daily Dx E11.9  . Cholecalciferol (VITAMIN D) 50 MCG (2000 UT) tablet Take 4,000 Units by mouth daily.  . ferrous sulfate 325 (65 FE) MG tablet Take 1 tablet (325 mg total) by mouth 2 (two) times daily with a meal.  . glucose blood (TRUE METRIX BLOOD GLUCOSE TEST) test strip Check BS daily Dx E11.9  . lisinopril (ZESTRIL) 10 MG tablet Take 1 tablet by mouth once daily (Patient taking differently: Take 10 mg by mouth daily.)  . lovastatin (MEVACOR) 20 MG tablet Take  1 tablet (20 mg total) by mouth at bedtime.  . metFORMIN (GLUCOPHAGE) 1000 MG tablet Take 1 tablet (1,000 mg total) by mouth 2 (two) times daily with a meal.  . nadolol (CORGARD) 20 MG tablet Take 1 tablet (20 mg total) by mouth daily.  Marland Kitchen oxyCODONE-acetaminophen (PERCOCET) 10-325 MG tablet Take 1 tablet by mouth every 6 (six) hours as needed for pain.  Marland Kitchen oxymetazoline (AFRIN) 0.05 % nasal spray Place 1 spray into both nostrils 2 (two) times daily as needed for congestion.  . pantoprazole (PROTONIX) 40 MG tablet Take 1 tablet (40 mg total) by mouth daily.  . potassium chloride SA (KLOR-CON) 20 MEQ tablet Take 2 tablets (40 mEq total) by mouth 2 (two) times daily.  Marland Kitchen torsemide (DEMADEX) 20 MG tablet Take 3 tablets (60 mg total) by mouth 2 (two) times daily. (Patient taking differently: Take 40 mg by mouth 2 (two) times daily.)  . TRUEplus Lancets 30G MISC Check BS daily Dx E11.9  . [DISCONTINUED] albuterol (PROVENTIL HFA;VENTOLIN HFA) 108 (90 Base) MCG/ACT inhaler Inhale 2 puffs into the lungs every 6 (six) hours as needed for wheezing or shortness of breath.   No facility-administered encounter medications on file as of 05/27/2020.   Goals    .  Client will talk with LCSW in next 30 days to discuss depression issues of client and manaement of depression issues (pt-stated)      CARE PLAN ENTRY   Current Barriers:  Patient with chronic diagnoses of Depression,  Obesity, DM, HTN, COPD, DJD, HLD  Clinical Social Work Clinical Goal(s):  Marland Kitchen LCSW will call client in next 30 days to discuss depression issues of client and client management of depression issues  Interventions:  Talked with client about current client needs Talked with client about her recent hospital stay Talked with client about pain issues Talked with client about her appointment on March 30 of 2022 with Dr. Lajuana Ripple Talked with client about her support from Dr. Abbey Chatters, Gastroenterologist (she said she has appointment with Dr.  Abbey Chatters in April of 2022) Talked with client about sleeping issues of client Talked with client  about family support Talked with client about appetite of client Talked with client about transport needs Talked with client about ambulation of client (uses walker to walk) Talked with client about ADLs completion of client (she said she cannot step anymore into bathtub) Talked with client about meal provision for client Talked with client about DME (has a walker with a seat, 3 in 1 bedside commode,oxygen system, has a shower chair) Talked with client about [place] support Talked with client about mood of client Talked with client about edema issues Encouraged client to call [facility] T[facility] Nurse as needed for nursing support  Patient Self Care Activities:   Eats meals with set up assistance   Patient Self Care Deficits:  Shortness of breath (breathing challenges, uses oxygen as needed) Mobility issues Transport challenges  Initial goal documentation     Follow Up Plan:LCSW to call client/daughter of clientin next 4 weeks to talk withclient/daughterabout depression issues of client and to talkabout clientmanagement of depression issues faced  [name].Tannis Burstein MSW, LCSW Licensed Clinical Social Worker [facility] Care Management [phone]

## 2020-05-27 NOTE — Patient Instructions (Addendum)
Licensed Clinical Social Worker Visit Information  Goals we discussed today:    Client will talk with LCSW in next 30 days to discuss depression issues of client and manaement of depression issues (pt-stated)          CARE PLAN ENTRY   Current Barriers:  Patient with chronic diagnoses of Depression, Obesity, DM, HTN, COPD, DJD, HLD  Clinical Social Work Clinical Goal(s):   LCSW will call client in next 30 days to discuss depression issues of client and client management of depression issues  Interventions:  Talked with client about current client needs Talked with client about her recent hospital stay Talked with client about pain issues Talked with client about her appointment on March 30 of 2022 with Dr. Lajuana Ripple Talked with client about her support from Dr. Abbey Chatters, Gastroenterologist (she said she has appointment with Dr. Abbey Chatters in April of 2022) Talked with client about sleeping issues of client Talked with client  about family support Talked with client about appetite of client Talked with client about transport needs Talked with client about ambulation of client (uses walker to walk) Talked with client about ADLs completion of client (she said she cannot step anymore into bathtub) Talked with client about meal provision for client Talked with client about DME (has a walker with a seat, 3 in 1 bedside commode,oxygen system, has a shower chair) Talked with client about [place] support Talked with client about mood of client Talked with client about edema issues Encouraged client to call [facility] Triage Nurse as needed for nursing support  Patient Self Care Activities:   Eats meals with set up assistance   Patient Self Care Deficits:  Shortness of breath (breathing challenges, uses oxygen as needed) Mobility issues Transport challenges  Initial goal documentation     Follow Up Plan:LCSW to call client/daughter of clientin next 4 weeks to talk  withclient/daughterabout depression issues of client and to talkabout clientmanagement of depression issues faced  Materials Provided: No  The patient verbalized understanding of instructions provided today and declined a print copy of patient instruction materials.   [name].Rydell Wiegel MSW, LCSW Licensed Clinical Social Worker [facility] Care Management [phone]

## 2020-05-28 LAB — HEMOGLOBIN, FINGERSTICK: Hemoglobin: 6.7 g/dL — CL (ref 11.1–15.9)

## 2020-05-29 DIAGNOSIS — M545 Low back pain, unspecified: Secondary | ICD-10-CM | POA: Diagnosis not present

## 2020-05-29 DIAGNOSIS — G8929 Other chronic pain: Secondary | ICD-10-CM | POA: Diagnosis not present

## 2020-05-29 DIAGNOSIS — Z9189 Other specified personal risk factors, not elsewhere classified: Secondary | ICD-10-CM | POA: Diagnosis not present

## 2020-05-29 DIAGNOSIS — G894 Chronic pain syndrome: Secondary | ICD-10-CM | POA: Diagnosis not present

## 2020-05-29 NOTE — Telephone Encounter (Signed)
Dena, please call patient to remind her about having abdominal x-ray and blood work completed.  She was due for CBC and abdominal x-ray on 3/1.

## 2020-06-01 ENCOUNTER — Telehealth: Payer: Self-pay | Admitting: *Deleted

## 2020-06-01 NOTE — Chronic Care Management (AMB) (Signed)
  Care Management   Note  06/01/2020 Name: SHIRLY BARTOSIEWICZ MRN: [id number] DOB: [birth year]  CLAUDIA GREENLEY is a 70 y.o. year old female who is a primary care patient of [name], DO and is actively engaged with the care management team. I reached out to [name] by phone today to assist with re-scheduling a follow up visit with the RN Case Manager  Follow up plan: Unsuccessful telephone outreach attempt made. A HIPAA compliant phone message was left for the patient providing contact information and requesting a return call.  The care management team will reach out to the patient again over the next 7 days.  If patient returns call to provider office, please advise to call Allen Lysle Morales at [facility]

## 2020-06-02 NOTE — Telephone Encounter (Signed)
The pt returned call and advised me was never told what to do and she thought that follow-up appt with Dr. Abbey Chatters was for that reason. I explained to her to please go have her blood-work and xray done @[facility] . Pt agreed to go once she gets her car out of the repair shop. She also will keep follow-up appt here.

## 2020-06-02 NOTE — Chronic Care Management (AMB) (Signed)
  Care Management   Note  06/02/2020 Name: Savannah Burns MRN: [id number] DOB: 1950/12/07  Savannah Burns is a 70 y.o. year old female who is a primary care patient of [name], DO and is actively engaged with the care management team. I reached out to [name] by phone today to assist with re-scheduling a follow up visit with the RN Case Manager  Follow up plan: Telephone appointment with care management team member scheduled for: 06/11/2020  [place] [facility]

## 2020-06-02 NOTE — Telephone Encounter (Signed)
Phoned and LM for the pt to return call regarding the status of her blood-work and abdominal x-ray.

## 2020-06-05 ENCOUNTER — Other Ambulatory Visit (HOSPITAL_COMMUNITY)
Admission: RE | Admit: 2020-06-05 | Discharge: 2020-06-05 | Disposition: A | Discharge status: Home / Self Care | Payer: Medicare PPO | Source: Ambulatory Visit | Attending: Gastroenterology | Admitting: Gastroenterology

## 2020-06-05 ENCOUNTER — Ambulatory Visit (HOSPITAL_COMMUNITY)
Admission: RE | Admit: 2020-06-05 | Discharge: 2020-06-05 | Disposition: A | Discharge status: Home / Self Care | Payer: Medicare PPO | Source: Ambulatory Visit | Attending: Gastroenterology | Admitting: Gastroenterology

## 2020-06-05 ENCOUNTER — Other Ambulatory Visit: Payer: Self-pay

## 2020-06-05 DIAGNOSIS — D500 Iron deficiency anemia secondary to blood loss (chronic): Secondary | ICD-10-CM | POA: Diagnosis not present

## 2020-06-05 DIAGNOSIS — K8020 Calculus of gallbladder without cholecystitis without obstruction: Secondary | ICD-10-CM | POA: Diagnosis not present

## 2020-06-05 LAB — CBC WITH DIFFERENTIAL/PLATELET
Abs Immature Granulocytes: 0.01 10*3/uL (ref 0.00–0.07)
Basophils Absolute: 0 10*3/uL (ref 0.0–0.1)
Basophils Relative: 0 %
Eosinophils Absolute: 0 10*3/uL (ref 0.0–0.5)
Eosinophils Relative: 1 %
HCT: 30.6 % — ABNORMAL LOW (ref 36.0–46.0)
Hemoglobin: 8.8 g/dL — ABNORMAL LOW (ref 12.0–15.0)
Immature Granulocytes: 0 %
Lymphocytes Relative: 37 %
Lymphs Abs: 1.8 10*3/uL (ref 0.7–4.0)
MCH: 25.2 pg — ABNORMAL LOW (ref 26.0–34.0)
MCHC: 28.8 g/dL — ABNORMAL LOW (ref 30.0–36.0)
MCV: 87.7 fL (ref 80.0–100.0)
Monocytes Absolute: 0.5 10*3/uL (ref 0.1–1.0)
Monocytes Relative: 11 %
Neutro Abs: 2.4 10*3/uL (ref 1.7–7.7)
Neutrophils Relative %: 51 %
Platelets: 156 10*3/uL (ref 150–400)
RBC: 3.49 MIL/uL — ABNORMAL LOW (ref 3.87–5.11)
RDW: 20.8 % — ABNORMAL HIGH (ref 11.5–15.5)
WBC: 4.8 10*3/uL (ref 4.0–10.5)
nRBC: 0 % (ref 0.0–0.2)

## 2020-06-06 NOTE — Telephone Encounter (Signed)
Noted  

## 2020-06-11 ENCOUNTER — Ambulatory Visit: Payer: Medicare PPO | Admitting: *Deleted

## 2020-06-11 DIAGNOSIS — J449 Chronic obstructive pulmonary disease, unspecified: Secondary | ICD-10-CM | POA: Diagnosis not present

## 2020-06-11 DIAGNOSIS — E785 Hyperlipidemia, unspecified: Secondary | ICD-10-CM | POA: Diagnosis not present

## 2020-06-11 DIAGNOSIS — I10 Essential (primary) hypertension: Secondary | ICD-10-CM | POA: Diagnosis not present

## 2020-06-11 DIAGNOSIS — F339 Major depressive disorder, recurrent, unspecified: Secondary | ICD-10-CM

## 2020-06-11 DIAGNOSIS — D500 Iron deficiency anemia secondary to blood loss (chronic): Secondary | ICD-10-CM

## 2020-06-11 DIAGNOSIS — E1169 Type 2 diabetes mellitus with other specified complication: Secondary | ICD-10-CM | POA: Diagnosis not present

## 2020-06-11 DIAGNOSIS — E119 Type 2 diabetes mellitus without complications: Secondary | ICD-10-CM

## 2020-06-11 NOTE — Patient Instructions (Signed)
Visit Information  PATIENT GOALS: Goals Addressed            This Visit's Progress   . Anemia Improved   Not on track    Timeframe:  Short-Term Goal Priority:  High Start Date:                             Expected End Date:                       Follow-up: 07/08/20  . Self administers medications as prescribed . Call provider office for new concerns or questions . Keep appt with GI on 07/01/20 . Call GI with any new or worsening symptoms . eat 3 to 5 servings of fruits and vegetables each day . eat raw fruit and vegetables every day . set a daily activity goal . take a stool softener  . choose iron-rich foods . don't eat dairy products, such as milk, cheese and ice cream with those high in iron . eat dark, leafy greens, such as spinach, chard and collards . include high-iron food, such as meat, chicken, fish, shellfish, dried fruit, beans and nuts in each meal . pair iron-rich foods with foods high in vitamin C, such as oranges, strawberries and red peppers        Patient verbalizes understanding of instructions provided today and agrees to view in [place].   Follow Up Plan:  . Telephone follow up appointment with care management team member scheduled for: 07/08/20 with RNCM . The patient has been provided with contact information for the care management team and has been advised to call with any health related questions or concerns.  . Next PCP appointment scheduled for: 06/24/20 with Dr Lajuana Ripple . Next GI appointment schedule for: 07/01/20  [name], BSN, RN-BC [place] [facility] [place] Management Direct Dial: [phone]

## 2020-06-11 NOTE — Chronic Care Management (AMB) (Signed)
Chronic Care Management   CCM RN Visit Note  06/11/2020 Name: Savannah Burns MRN: [id number] DOB: Sep 30, 1950  Subjective: Savannah Burns is a 70 y.o. year old female who is a primary care patient of [name], DO. The care management team was consulted for assistance with disease management and care coordination needs.    Engaged with patient by telephone for follow up visit in response to provider referral for case management and/or care coordination services.   Consent to Services:  The patient was given information about Chronic Care Management services, agreed to services, and gave verbal consent prior to initiation of services.  Please see initial visit note for detailed documentation.   Patient agreed to services and verbal consent obtained.   Assessment: Review of patient past medical history, allergies, medications, health status, including review of consultants reports, laboratory and other test data, was performed as part of comprehensive evaluation and provision of chronic care management services.   SDOH (Social Determinants of Health) assessments and interventions performed:    CCM Care Plan  Allergies  Allergen Reactions  . Keflex [Cephalexin] Nausea And Vomiting    Outpatient Encounter Medications as of 06/11/2020  Medication Sig  . Blood Glucose Calibration (GLUCOMETER DEX HIGH CONTROL) LIQD Test CBG's once daily.   . Blood Glucose Monitoring Suppl (TRUE METRIX AIR GLUCOSE METER) w/Device KIT Check BS daily Dx E11.9  . Cholecalciferol (VITAMIN D) 50 MCG (2000 UT) tablet Take 4,000 Units by mouth daily.  . ferrous sulfate 325 (65 FE) MG tablet Take 1 tablet (325 mg total) by mouth 2 (two) times daily with a meal.  . glucose blood (TRUE METRIX BLOOD GLUCOSE TEST) test strip Check BS daily Dx E11.9  . lisinopril (ZESTRIL) 10 MG tablet Take 1 tablet by mouth once daily (Patient taking differently: Take 10 mg by mouth daily.)  . lovastatin (MEVACOR) 20  MG tablet Take 1 tablet (20 mg total) by mouth at bedtime.  . metFORMIN (GLUCOPHAGE) 1000 MG tablet Take 1 tablet (1,000 mg total) by mouth 2 (two) times daily with a meal.  . nadolol (CORGARD) 20 MG tablet Take 1 tablet (20 mg total) by mouth daily.  Marland Kitchen oxyCODONE-acetaminophen (PERCOCET) 10-325 MG tablet Take 1 tablet by mouth every 6 (six) hours as needed for pain.  Marland Kitchen oxymetazoline (AFRIN) 0.05 % nasal spray Place 1 spray into both nostrils 2 (two) times daily as needed for congestion.  . pantoprazole (PROTONIX) 40 MG tablet Take 1 tablet (40 mg total) by mouth daily.  . potassium chloride SA (KLOR-CON) 20 MEQ tablet Take 2 tablets (40 mEq total) by mouth 2 (two) times daily.  Marland Kitchen torsemide (DEMADEX) 20 MG tablet Take 3 tablets (60 mg total) by mouth 2 (two) times daily. (Patient taking differently: Take 40 mg by mouth 2 (two) times daily.)  . TRUEplus Lancets 30G MISC Check BS daily Dx E11.9  . [DISCONTINUED] albuterol (PROVENTIL HFA;VENTOLIN HFA) 108 (90 Base) MCG/ACT inhaler Inhale 2 puffs into the lungs every 6 (six) hours as needed for wheezing or shortness of breath.   No facility-administered encounter medications on file as of 06/11/2020.    Patient Active Problem List   Diagnosis Date Noted  . Hypoalbuminemia 05/17/2020  . Moderate protein malnutrition ([place]) 05/17/2020  . Melena 05/16/2020  . Hyperlipidemia   . Hypokalemia   . GI bleed 03/20/2020  . Lower GI bleed 03/19/2020  . Acute on chronic congestive heart failure ([place])   . Symptomatic anemia   . Iron  deficiency anemia   . Heme positive stool   . ABLA (acute blood loss anemia) 01/22/2020  . Supplemental oxygen dependent 10/22/2019  . Chronic dyspnea 10/22/2019  . Hypertension associated with diabetes ([place]) 10/22/2019  . Bilateral hand pain 09/21/2016  . Fatigue 07/30/2015  . Chronic diastolic HF (heart failure) ([place]) 06/29/2015  . Postmenopausal bleeding 04/17/2015  . Excessive daytime sleepiness 12/19/2014  . Swelling  of lower extremity 11/27/2014  . Back pain 06/13/2013  . Sinusitis, chronic 05/30/2012  . Allergic rhinitis 06/06/2011  . Hyperlipidemia associated with type 2 diabetes mellitus ([place]) 03/24/2009  . Diabetes mellitus, type 2 ([place]) 08/12/2008  . Pulmonary nodule 08/29/2007  . COPD (chronic obstructive pulmonary disease) ([place]) 02/14/2007  . Class 3 obesity 05/25/2006  . TOBACCO DEPENDENCE 05/25/2006  . Depression, recurrent ([place]) 05/25/2006  . HYPERTENSION, BENIGN SYSTEMIC 05/25/2006  . DJD, UNSPECIFIED 05/25/2006    Conditions to be addressed/monitored: anemia, depression, DM, HTN  Care Plan : Iron Deficiency (Adult)  Updates made by Ilean China, RN since 06/11/2020 12:00 AM    Problem: Iron Deficiency   Priority: High  Note:   Iron deficiency anemia in a patient with depression, Obesity, DM, HTN, COPD, DJD, HLD, CHF    Goal: Anemia Management   Start Date: 02/28/2020  Recent Progress: Not on track  Priority: High  Note:   Current Barriers:  . Chronic Disease Management support and education needs related to anemia . Cause of anemia unkown . Financial constraints  Nurse Case Manager Clinical Goal(s):  Marland Kitchen Patient will follow-up with gastroenterologist to discuss plan for anemia management . Patient will talk with [facility] regarding self-management of anemia  Interventions:  . Inter-disciplinary care team collaboration (see longitudinal plan of care) . Chart reviewed including recent office notes, surgical reports, pathology reports, and lab results . Reviewed medications . Discussed results of recent colonoscopies, endoscopies, and xray . Discussed use of camera to visualize small intestine was unsuccessful but advised patient that xray did show that it was not retained . Discussed hemoglobin trend  . Discussed that patient has not noticed any blood in stool and has not been vomiting blood . Recommended an iron rich diet . Discussed constipation treatment and  prevention . Discussed s/s of worsening anemia . Reviewed upcoming appointment with GI on 07/01/20 . Encouraged patient to reach out to [facility] as needed . Advised patient to seek emergency medical attention if necessary  Patient Self Care Activities:  . Self administers medications as prescribed . Call provider office for new concerns or questions . Keep appt with GI on 07/01/20 . Call GI with any new or worsening symptoms . eat 3 to 5 servings of fruits and vegetables each day . eat raw fruit and vegetables every day . set a daily activity goal . take a stool softener  . choose iron-rich foods . don't eat dairy products, such as milk, cheese and ice cream with those high in iron . eat dark, leafy greens, such as spinach, chard and collards . include high-iron food, such as meat, chicken, fish, shellfish, dried fruit, beans and nuts in each meal . pair iron-rich foods with foods high in vitamin C, such as oranges, strawberries and red peppers      Follow Up Plan:  . Telephone follow up appointment with care management team member scheduled for: 07/08/20 with RNCM . The patient has been provided with contact information for the care management team and has been advised to call with any health  related questions or concerns.  . Next PCP appointment scheduled for: 06/24/20 with Dr Lajuana Ripple . Next GI appointment schedule for: 07/01/20  [name], BSN, RN-BC [place] [facility] [place] Management Direct Dial: [phone]

## 2020-06-16 NOTE — Progress Notes (Addendum)
error 

## 2020-06-23 ENCOUNTER — Other Ambulatory Visit: Payer: Self-pay | Admitting: *Deleted

## 2020-06-23 DIAGNOSIS — J449 Chronic obstructive pulmonary disease, unspecified: Secondary | ICD-10-CM | POA: Diagnosis not present

## 2020-06-24 ENCOUNTER — Ambulatory Visit: Payer: Medicare PPO | Admitting: Family Medicine

## 2020-06-24 ENCOUNTER — Encounter: Payer: Self-pay | Admitting: Family Medicine

## 2020-06-24 ENCOUNTER — Other Ambulatory Visit: Payer: Self-pay

## 2020-06-24 VITALS — BP 108/64 | HR 84 | Temp 98.1°F | Ht 62.0 in | Wt 289.6 lb

## 2020-06-24 DIAGNOSIS — D500 Iron deficiency anemia secondary to blood loss (chronic): Secondary | ICD-10-CM

## 2020-06-24 LAB — HEMOGLOBIN, FINGERSTICK: Hemoglobin: 7.7 g/dL — ABNORMAL LOW (ref 11.1–15.9)

## 2020-06-24 NOTE — Addendum Note (Signed)
Addended by: Caryl Pina on: 06/24/2020 03:25 PM   Modules accepted: Orders

## 2020-06-24 NOTE — Progress Notes (Addendum)
BP 108/64   Pulse 84   Temp 98.1 F (36.7 C) (Temporal)   Ht _0  (1.575 m)   Wt 289 lb 9.6 oz (131.4 kg)   SpO2 95%   BMI 52.97 kg/m    Subjective:   Patient ID: Savannah Burns, female    DOB: 22-Jul-1950, 70 y.o.   MRN: [id number]  HPI: Savannah Burns is a 70 y.o. female presenting on 06/24/2020 for [facility] follow-up October was in the ED for Hb 3.8.  They did colonoscopy and had polyps on 2 occasions. Dr Abbey Chatters is the GI. She was back in December for bleeding and anemia again. She was in again on 05/16/20 and was still having bleeding and anemia.  She did the camera EGD. Her last hb is 8.8.  She is taking iron and is feeling better and is having more energy.  She denies any blood in stool. She is taking iron every day.  She denies dark stools anymore.    Relevant past medical, surgical, family and social history reviewed and updated as indicated. Interim medical history since our last visit reviewed. Allergies and medications reviewed and updated.  Review of Systems  Constitutional: Negative for chills and fever.  Eyes: Negative for visual disturbance.  Respiratory: Negative for chest tightness and shortness of breath.   Cardiovascular: Negative for chest pain and leg swelling.  Gastrointestinal: Negative for abdominal pain.  Skin: Negative for rash.  Neurological: Negative for light-headedness and headaches.  Psychiatric/Behavioral: Negative for agitation and behavioral problems.  All other systems reviewed and are negative.   Per HPI unless specifically indicated above   Allergies as of 06/24/2020      Reactions   Keflex [cephalexin] Nausea And Vomiting      Medication List       Accurate as of June 24, 2020  3:12 PM. If you have any questions, ask your nurse or doctor.        STOP taking these medications   oxymetazoline 0.05 % nasal spray Commonly known as: AFRIN Stopped by: Fransisca Kaufmann Dettinger, MD     TAKE these  medications   ferrous sulfate 325 (65 FE) MG tablet Take 1 tablet (325 mg total) by mouth 2 (two) times daily with a meal.   Glucometer Dex High Control Liqd Test CBG's once daily.   lisinopril 10 MG tablet Commonly known as: ZESTRIL Take 1 tablet by mouth once daily   lovastatin 20 MG tablet Commonly known as: MEVACOR Take 1 tablet (20 mg total) by mouth at bedtime.   metFORMIN 1000 MG tablet Commonly known as: GLUCOPHAGE Take 1 tablet (1,000 mg total) by mouth 2 (two) times daily with a meal.   nadolol 20 MG tablet Commonly known as: Corgard Take 1 tablet (20 mg total) by mouth daily.   oxyCODONE-acetaminophen 10-325 MG tablet Commonly known as: PERCOCET Take 1 tablet by mouth every 6 (six) hours as needed for pain.   pantoprazole 40 MG tablet Commonly known as: Protonix Take 1 tablet (40 mg total) by mouth daily.   potassium chloride SA 20 MEQ tablet Commonly known as: KLOR-CON Take 2 tablets (40 mEq total) by mouth 2 (two) times daily.   torsemide 20 MG tablet Commonly known as: DEMADEX Take 3 tablets (60 mg total) by mouth 2 (two) times daily. What changed: how much to take   True Metrix Air Glucose Meter w/Device Kit Check BS daily Dx E11.9   True Metrix Blood  Glucose Test test strip Generic drug: glucose blood Check BS daily Dx E11.9   TRUEplus Lancets 30G Misc Check BS daily Dx E11.9   Vitamin D 50 MCG (2000 UT) tablet Take 4,000 Units by mouth daily.        Objective:   BP 108/64   Pulse 84   Temp 98.1 F (36.7 C) (Temporal)   Ht _0  (1.575 m)   Wt 289 lb 9.6 oz (131.4 kg)   SpO2 95%   BMI 52.97 kg/m   Wt Readings from Last 3 Encounters:  06/24/20 289 lb 9.6 oz (131.4 kg)  05/17/20 289 lb 15.9 oz (131.5 kg)  04/03/20 290 lb (131.5 kg)    Physical Exam Vitals and nursing note reviewed.  Constitutional:      General: She is not in acute distress.    Appearance: She is well-developed. She is not diaphoretic.  Eyes:      Conjunctiva/sclera: Conjunctivae normal.  Cardiovascular:     Rate and Rhythm: Normal rate and regular rhythm.     Heart sounds: Normal heart sounds. No murmur heard.   Pulmonary:     Effort: Pulmonary effort is normal. No respiratory distress.     Breath sounds: Normal breath sounds. No wheezing.  Musculoskeletal:        General: No tenderness. Normal range of motion.  Skin:    General: Skin is warm and dry.     Findings: No rash.  Neurological:     Mental Status: She is alert and oriented to person, place, and time.     Coordination: Coordination normal.  Psychiatric:        Behavior: Behavior normal.       Assessment & Plan:   Problem List Items Addressed This Visit      Other   Iron deficiency anemia - Primary   Relevant Orders   CBC with Differential/Platelet   Hemoglobin, fingerstick   Ambulatory referral to Hematology      Will recheck blood counts today.  If continues to have issues may consider hematology referral.  We will see what blood counts are today.    POC hemoglobin 7.7, will do referral to hematology. Follow up plan: Return in about 2 months (around 08/24/2020), or if symptoms worsen or fail to improve, for Anemia recheck with PCP.  Counseling provided for all of the vaccine components Orders Placed This Encounter  Procedures  . CBC with Differential/Platelet  . Hemoglobin, fingerstick    Caryl Pina, MD [facility] 06/24/2020, 3:12 PM

## 2020-06-25 LAB — CBC WITH DIFFERENTIAL/PLATELET
Basophils Absolute: 0 10*3/uL (ref 0.0–0.2)
Basos: 1 %
EOS (ABSOLUTE): 0.1 10*3/uL (ref 0.0–0.4)
Eos: 1 %
Hematocrit: 26.6 % — ABNORMAL LOW (ref 34.0–46.6)
Hemoglobin: 7.8 g/dL — CL (ref 11.1–15.9)
Immature Grans (Abs): 0 10*3/uL (ref 0.0–0.1)
Immature Granulocytes: 0 %
Lymphocytes Absolute: 1.8 10*3/uL (ref 0.7–3.1)
Lymphs: 30 %
MCH: 24.6 pg — ABNORMAL LOW (ref 26.6–33.0)
MCHC: 29.3 g/dL — ABNORMAL LOW (ref 31.5–35.7)
MCV: 84 fL (ref 79–97)
Monocytes Absolute: 0.5 10*3/uL (ref 0.1–0.9)
Monocytes: 9 %
Neutrophils Absolute: 3.5 10*3/uL (ref 1.4–7.0)
Neutrophils: 59 %
Platelets: 149 10*3/uL — ABNORMAL LOW (ref 150–450)
RBC: 3.17 x10E6/uL — ABNORMAL LOW (ref 3.77–5.28)
RDW: 18.4 % — ABNORMAL HIGH (ref 11.7–15.4)
WBC: 5.9 10*3/uL (ref 3.4–10.8)

## 2020-06-25 MED ORDER — FERROUS SULFATE 325 (65 FE) MG PO TABS: 325.0000 mg | ORAL_TABLET | Freq: Two times a day (BID) | ORAL | 1 refills | Status: DC

## 2020-06-25 MED ORDER — PANTOPRAZOLE SODIUM 40 MG PO TBEC: 40.0000 mg | DELAYED_RELEASE_TABLET | Freq: Every day | ORAL | 5 refills | Status: DC

## 2020-06-29 ENCOUNTER — Telehealth: Payer: Medicare PPO

## 2020-06-29 DIAGNOSIS — M545 Low back pain, unspecified: Secondary | ICD-10-CM | POA: Diagnosis not present

## 2020-06-29 DIAGNOSIS — F1120 Opioid dependence, uncomplicated: Secondary | ICD-10-CM | POA: Diagnosis not present

## 2020-06-29 DIAGNOSIS — G8929 Other chronic pain: Secondary | ICD-10-CM | POA: Diagnosis not present

## 2020-06-29 DIAGNOSIS — Z79899 Other long term (current) drug therapy: Secondary | ICD-10-CM | POA: Diagnosis not present

## 2020-06-29 DIAGNOSIS — G894 Chronic pain syndrome: Secondary | ICD-10-CM | POA: Diagnosis not present

## 2020-07-01 ENCOUNTER — Encounter: Payer: Self-pay | Admitting: Internal Medicine

## 2020-07-01 ENCOUNTER — Ambulatory Visit: Payer: Medicare PPO | Admitting: Internal Medicine

## 2020-07-01 ENCOUNTER — Telehealth: Payer: Self-pay | Admitting: *Deleted

## 2020-07-01 ENCOUNTER — Other Ambulatory Visit: Payer: Self-pay

## 2020-07-01 VITALS — BP 120/59 | HR 101 | Temp 97.1°F | Ht 62.0 in | Wt 292.4 lb

## 2020-07-01 DIAGNOSIS — K7460 Unspecified cirrhosis of liver: Secondary | ICD-10-CM | POA: Diagnosis not present

## 2020-07-01 DIAGNOSIS — I8500 Esophageal varices without bleeding: Secondary | ICD-10-CM

## 2020-07-01 DIAGNOSIS — D126 Benign neoplasm of colon, unspecified: Secondary | ICD-10-CM | POA: Diagnosis not present

## 2020-07-01 DIAGNOSIS — D509 Iron deficiency anemia, unspecified: Secondary | ICD-10-CM

## 2020-07-01 DIAGNOSIS — I8510 Secondary esophageal varices without bleeding: Secondary | ICD-10-CM

## 2020-07-01 NOTE — Patient Instructions (Signed)
We will schedule you for EGD with Givens capsule deployment to further evaluate your anemia.  Agree with hematology referral to further evaluate for underlying hematologic disorders.  At [facility] we value your feedback. You may receive a survey about your visit today. Please share your experience as we strive to create trusting relationships with our patients to provide genuine, compassionate, quality care.  We appreciate your understanding and patience as we review any laboratory studies, imaging, and other diagnostic tests that are ordered as we care for you. Our office policy is 5 business days for review of these results, and any emergent or urgent results are addressed in a timely manner for your best interest. If you do not hear from our office in 1 week, please contact us.   We also encourage the use of MyChart, which contains your medical information for your review as well. If you are not enrolled in this feature, an access code is on this after visit summary for your convenience. Thank you for allowing [place] to be involved in your care.  It was great to see you today!  I hope you have a great rest of your spring!!    Elon Alas. Abbey Chatters, D.O. Gastroenterology and Hepatology [facility]

## 2020-07-01 NOTE — Telephone Encounter (Signed)
PA submitted to [facility] for egd/givens capsule. Clinicals uploaded. Ref# [id number]

## 2020-07-01 NOTE — Progress Notes (Signed)
Referring Provider: [name], DO Primary Care Physician:  [name], DO Primary GI:  Dr. Abbey Chatters  Chief Complaint  Patient presents with  . Anemia    States hgb is dropping. Hasn't seen any bleeding    HPI:   Savannah Burns is a 70 y.o. female who presents to clinic for follow-up visit for anemia.  Patient has had multiple hospital admissions to our local facility for chronic anemia.  She has undergone extensive endoscopic work-up including EGD and colonoscopy (see below for further details).  Her source of chronic anemia has yet to be identified.  She underwent capsule endoscopy in the hospital, but unfortunately the capsule stayed in her stomach for approximately 7 hours.  Most recent hemoglobin 7.8.  She is on oral iron supplementation which she states she is tolerating without any difficulty.  Denies any melena or hematochezia.  She is also been diagnosed with cirrhosis likely due to [place].  Current meld 7.  Today her main complaint is ongoing generalized weakness/fatigue.  She is very frustrated in regards to her anemia as we have been unable to identify source at this time.  She does have a visit coming up with hematology next week  Last EGD:  05/18/2020-grade 1 esophageal varices 01/25/2020 - Grade I esophageal varices. Last Colonoscopy: 03/2020 - Non-bleeding internal hemorrhoids. - Diverticulosis in the sigmoid colon and in the descending colon. - One 10 mm polyp in the cecum, removed with mucosal resection. Resected and Retrieved. SSL. - One 7 mm polyp in the ascending colon, removed with a hot snare. Resected and Retrieved. Tubular adenoma. - One 18 mm polyp at the hepatic flexure, removed with a hot snare. Resected and Retrieved. Tubular adenoma. - Post-polypectomy scar in the transverse colon. Biopsied - colonic mucosa with focal fibrosis.  Past Medical History:  Diagnosis Date  . Adrenal adenoma, left    Stable  . Anxiety   . Arthritis     bilateral hands  . Depression   . Diabetes mellitus, type 2 ([place]) 08/12/2008   Qualifier: Diagnosis of  By: Deborra Medina MD, Tanja Port    . Dyspnea   . Esophageal varices ([place])   . Grade II diastolic dysfunction   . History of kidney stones   . Hyperlipidemia   . Hypertension   . Lower back pain   . Lower GI bleed 03/19/2020  . Panic attacks   . Pneumonia    currently taking antibiotic and prednisone for early stages of pneumonia  . Pulmonary nodules    bilateral  . Skin cancer    face    Past Surgical History:  Procedure Laterality Date  . BIOPSY  04/07/2020   Procedure: BIOPSY;  Surgeon: Eloise Harman, DO;  [facility];  Service: Endoscopy;;  . Breast Cystectomy  Right   . CESAREAN SECTION    . COLONOSCOPY WITH PROPOFOL N/A 01/25/2020   Dr. Abbey Chatters: Nonbleeding internal hemorrhoids, diverticulosis, 5 mm polyp removed from the ascending colon, 10 mm polyp removed from the sigmoid colon, 30 mm polyp (tubulovillous adenoma with no high-grade dysplasia) removed from the transverse colon via piecemeal status post tattoo.  Other polyps were tubular adenomas.  3 month surveillance colonoscopy recommended.  . COLONOSCOPY WITH PROPOFOL N/A 04/07/2020   Procedure: COLONOSCOPY WITH PROPOFOL;  Surgeon: Eloise Harman, DO;  [facility];  Service: Endoscopy;  Laterality: N/A;  3:00pm, pt knows new time per office  . CYSTOSCOPY/URETEROSCOPY/HOLMIUM LASER/STENT PLACEMENT Bilateral 03/01/2019  Procedure: [id number]/AUQJFHL LASER/STENT PLACEMENT;  Surgeon: Ceasar Mons, MD;  [facility];  Service: Urology;  Laterality: Bilateral;  ONLY NEEDS 60 MIN  . ESOPHAGOGASTRODUODENOSCOPY (EGD) WITH PROPOFOL N/A 01/25/2020   Dr. Abbey Chatters: 4 columns grade 1 esophageal varices  . ESOPHAGOGASTRODUODENOSCOPY (EGD) WITH PROPOFOL N/A 05/18/2020   Procedure: ESOPHAGOGASTRODUODENOSCOPY (EGD) WITH PROPOFOL;  Surgeon: Eloise Harman, DO;  [facility];  Service: Endoscopy;  Laterality: N/A;  . GIVENS CAPSULE STUDY N/A 05/18/2020   Procedure: GIVENS CAPSULE STUDY;  Surgeon: Harvel Quale, MD;  [facility];  Service: Gastroenterology;  Laterality: N/A;  . POLYPECTOMY  01/25/2020   Procedure: POLYPECTOMY;  Surgeon: Eloise Harman, DO;  [facility];  Service: Endoscopy;;  . POLYPECTOMY  04/07/2020   Procedure: POLYPECTOMY INTESTINAL;  Surgeon: Eloise Harman, DO;  [facility];  Service: Endoscopy;;  . SKIN CANCER EXCISION     Face  . SPINE SURGERY    . SUBMUCOSAL TATTOO INJECTION  01/25/2020   Procedure: SUBMUCOSAL TATTOO INJECTION;  Surgeon: Eloise Harman, DO;  [facility];  Service: Endoscopy;;    Current Outpatient Medications  Medication Sig Dispense Refill  . Blood Glucose Calibration (GLUCOMETER DEX HIGH CONTROL) LIQD Test CBG's once daily.     . Blood Glucose Monitoring Suppl (TRUE METRIX AIR GLUCOSE METER) w/Device KIT Check BS daily Dx E11.9 1 kit 0  . Cholecalciferol (VITAMIN D) 50 MCG (2000 UT) tablet Take 4,000 Units by mouth daily.    . ferrous sulfate 325 (65 FE) MG tablet Take 1 tablet (325 mg total) by mouth 2 (two) times daily with a meal. 60 tablet 1  . glucose blood (TRUE METRIX BLOOD GLUCOSE TEST) test strip Check BS daily Dx E11.9 100 each 3  . lisinopril (ZESTRIL) 10 MG tablet Take 1 tablet by mouth once daily (Patient taking differently: Take 10 mg by mouth daily.) 90 tablet 0  . lovastatin (MEVACOR) 20 MG tablet Take 1 tablet (20 mg total) by mouth at bedtime. 90 tablet 3  . metFORMIN (GLUCOPHAGE) 1000 MG tablet Take 1 tablet (1,000 mg total) by mouth 2 (two) times daily with a meal. 180 tablet 3  . nadolol (CORGARD) 20 MG tablet Take 1 tablet (20 mg total) by mouth daily. 30 tablet 3  . oxyCODONE-acetaminophen (PERCOCET) 10-325 MG tablet Take 1 tablet by mouth every 6 (six) hours as needed for pain.    . pantoprazole (PROTONIX) 40 MG tablet  Take 1 tablet (40 mg total) by mouth daily. 30 tablet 5  . potassium chloride SA (KLOR-CON) 20 MEQ tablet Take 2 tablets (40 mEq total) by mouth 2 (two) times daily. 360 tablet 3  . torsemide (DEMADEX) 20 MG tablet Take 3 tablets (60 mg total) by mouth 2 (two) times daily. (Patient taking differently: Take 40 mg by mouth 2 (two) times daily.) 180 tablet 11  . TRUEplus Lancets 30G MISC Check BS daily Dx E11.9 100 each 3   No current facility-administered medications for this visit.    Allergies as of 07/01/2020 - Review Complete 07/01/2020  Allergen Reaction Noted  . Keflex [cephalexin] Nausea And Vomiting 04/17/2019    Family History  Problem Relation Age of Onset  . Diabetes Father   . Heart disease Father 48       MI  . Hypertension Father   . Anemia Mother        Transfusion dependent  . COPD Sister   . Cancer  Paternal Grandmother 55       Pancreatic    Social History   Socioeconomic History  . Marital status: Widowed    Spouse name: Not on file  . Number of children: 2  . Years of education: 2  . Highest education level: Not on file  Occupational History  . Occupation: Retired   Tobacco Use  . Smoking status: Former Smoker    Packs/day: 1.50    Years: 40.00    Pack years: 60.00    Types: Cigarettes    Quit date: 04/29/2015    Years since quitting: 5.1  . Smokeless tobacco: Never Used  . Tobacco comment: Quit smoking 04/2015- Previous 1.5 ppd smoker  Vaping Use  . Vaping Use: Never used  Substance and Sexual Activity  . Alcohol use: No    Alcohol/week: 0.0 standard drinks  . Drug use: No  . Sexual activity: Not Currently    Birth control/protection: Post-menopausal  Other Topics Concern  . Not on file  Social History Narrative  . Not on file   Social Determinants of Health   Financial Resource Strain: Low Risk   . Difficulty of Paying Living Expenses: Not very hard  Food Insecurity: Not on file  Transportation Needs: No Transportation Needs  . Lack of  Transportation (Medical): No  . Lack of Transportation (Non-Medical): No  Physical Activity: Inactive  . Days of Exercise per Week: 0 days  . Minutes of Exercise per Session: 0 min  Stress: Not on file  Social Connections: Not on file    Subjective: Review of Systems  Constitutional: Positive for malaise/fatigue. Negative for chills and fever.  HENT: Negative for congestion and hearing loss.   Eyes: Negative for blurred vision and double vision.  Respiratory: Negative for cough and shortness of breath.   Cardiovascular: Negative for chest pain and palpitations.  Gastrointestinal: Negative for abdominal pain, blood in stool, constipation, diarrhea, heartburn, melena and vomiting.  Genitourinary: Negative for dysuria and urgency.  Musculoskeletal: Negative for joint pain and myalgias.  Skin: Negative for itching and rash.  Neurological: Negative for dizziness and headaches.  Psychiatric/Behavioral: Negative for depression. The patient is not nervous/anxious.      Objective: BP (!) 120/59   Pulse (!) 101   Temp (!) 97.1 F (36.2 C) (Temporal)   Ht 5' 2"  (1.575 m)   Wt 292 lb 6.4 oz (132.6 kg)   BMI 53.48 kg/m  Physical Exam Constitutional:      Appearance: Normal appearance.  HENT:     Head: Normocephalic and atraumatic.  Eyes:     Extraocular Movements: Extraocular movements intact.     Conjunctiva/sclera: Conjunctivae normal.  Cardiovascular:     Rate and Rhythm: Normal rate and regular rhythm.  Pulmonary:     Effort: Pulmonary effort is normal.     Breath sounds: Normal breath sounds.  Abdominal:     General: Bowel sounds are normal.     Palpations: Abdomen is soft.  Musculoskeletal:        General: No swelling. Normal range of motion.     Cervical back: Normal range of motion and neck supple.  Skin:    General: Skin is warm and dry.     Coloration: Skin is not jaundiced.  Neurological:     General: No focal deficit present.     Mental Status: She is alert  and oriented to person, place, and time.  Psychiatric:        Mood and Affect: Mood normal.  Behavior: Behavior normal.      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  We will schedule her for EGD with capsule deployment to further evaluate potential small bowel as source of chronic GI blood loss including AVMs, polyps, malignancy, or other.  Agree with hematology referral.  Patient does note her mother had issues with anemia later in life requiring transfusions monthly.  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  We will likely need to perform full serological work-up in regards to this, but will hold off today until we further evaluate her anemia.  Esophageal varices are grade 1, does not appear that these have bled based on endoscopic evaluation.  Patient is currently on nonselective beta-blocker.  Colonoscopy recall January 2023  07/01/2020 10:29 AM   Disclaimer: This note was dictated with voice recognition software. Similar sounding words can inadvertently be transcribed and may not be corrected upon review.

## 2020-07-01 NOTE — H&P (View-Only) (Signed)
Referring Provider: [name], DO Primary Care Physician:  [name], DO Primary GI:  Dr. Abbey Chatters  Chief Complaint  Patient presents with  . Anemia    States hgb is dropping. Hasn't seen any bleeding    HPI:   Savannah Burns is a 70 y.o. female who presents to clinic for follow-up visit for anemia.  Patient has had multiple hospital admissions to our local facility for chronic anemia.  She has undergone extensive endoscopic work-up including EGD and colonoscopy (see below for further details).  Her source of chronic anemia has yet to be identified.  She underwent capsule endoscopy in the hospital, but unfortunately the capsule stayed in her stomach for approximately 7 hours.  Most recent hemoglobin 7.8.  She is on oral iron supplementation which she states she is tolerating without any difficulty.  Denies any melena or hematochezia.  She is also been diagnosed with cirrhosis likely due to [place].  Current meld 7.  Today her main complaint is ongoing generalized weakness/fatigue.  She is very frustrated in regards to her anemia as we have been unable to identify source at this time.  She does have a visit coming up with hematology next week  Last EGD:  05/18/2020-grade 1 esophageal varices 01/25/2020 - Grade I esophageal varices. Last Colonoscopy: 03/2020 - Non-bleeding internal hemorrhoids. - Diverticulosis in the sigmoid colon and in the descending colon. - One 10 mm polyp in the cecum, removed with mucosal resection. Resected and Retrieved. SSL. - One 7 mm polyp in the ascending colon, removed with a hot snare. Resected and Retrieved. Tubular adenoma. - One 18 mm polyp at the hepatic flexure, removed with a hot snare. Resected and Retrieved. Tubular adenoma. - Post-polypectomy scar in the transverse colon. Biopsied - colonic mucosa with focal fibrosis.  Past Medical History:  Diagnosis Date  . Adrenal adenoma, left    Stable  . Anxiety   . Arthritis     bilateral hands  . Depression   . Diabetes mellitus, type 2 ([place]) 08/12/2008   Qualifier: Diagnosis of  By: Deborra Medina MD, Tanja Port    . Dyspnea   . Esophageal varices ([place])   . Grade II diastolic dysfunction   . History of kidney stones   . Hyperlipidemia   . Hypertension   . Lower back pain   . Lower GI bleed 03/19/2020  . Panic attacks   . Pneumonia    currently taking antibiotic and prednisone for early stages of pneumonia  . Pulmonary nodules    bilateral  . Skin cancer    face    Past Surgical History:  Procedure Laterality Date  . BIOPSY  04/07/2020   Procedure: BIOPSY;  Surgeon: Eloise Harman, DO;  [facility];  Service: Endoscopy;;  . Breast Cystectomy  Right   . CESAREAN SECTION    . COLONOSCOPY WITH PROPOFOL N/A 01/25/2020   Dr. Abbey Chatters: Nonbleeding internal hemorrhoids, diverticulosis, 5 mm polyp removed from the ascending colon, 10 mm polyp removed from the sigmoid colon, 30 mm polyp (tubulovillous adenoma with no high-grade dysplasia) removed from the transverse colon via piecemeal status post tattoo.  Other polyps were tubular adenomas.  3 month surveillance colonoscopy recommended.  . COLONOSCOPY WITH PROPOFOL N/A 04/07/2020   Procedure: COLONOSCOPY WITH PROPOFOL;  Surgeon: Eloise Harman, DO;  [facility];  Service: Endoscopy;  Laterality: N/A;  3:00pm, pt knows new time per office  . CYSTOSCOPY/URETEROSCOPY/HOLMIUM LASER/STENT PLACEMENT Bilateral 03/01/2019  Procedure: [id number] LASER/STENT PLACEMENT;  Surgeon: Ceasar Mons, MD;  [facility];  Service: Urology;  Laterality: Bilateral;  ONLY NEEDS 60 MIN  . ESOPHAGOGASTRODUODENOSCOPY (EGD) WITH PROPOFOL N/A 01/25/2020   Dr. Abbey Chatters: 4 columns grade 1 esophageal varices  . ESOPHAGOGASTRODUODENOSCOPY (EGD) WITH PROPOFOL N/A 05/18/2020   Procedure: ESOPHAGOGASTRODUODENOSCOPY (EGD) WITH PROPOFOL;  Surgeon: Eloise Harman, DO;  [facility];  Service: Endoscopy;  Laterality: N/A;  . GIVENS CAPSULE STUDY N/A 05/18/2020   Procedure: GIVENS CAPSULE STUDY;  Surgeon: Harvel Quale, MD;  [facility];  Service: Gastroenterology;  Laterality: N/A;  . POLYPECTOMY  01/25/2020   Procedure: POLYPECTOMY;  Surgeon: Eloise Harman, DO;  [facility];  Service: Endoscopy;;  . POLYPECTOMY  04/07/2020   Procedure: POLYPECTOMY INTESTINAL;  Surgeon: Eloise Harman, DO;  [facility];  Service: Endoscopy;;  . SKIN CANCER EXCISION     Face  . SPINE SURGERY    . SUBMUCOSAL TATTOO INJECTION  01/25/2020   Procedure: SUBMUCOSAL TATTOO INJECTION;  Surgeon: Eloise Harman, DO;  [facility];  Service: Endoscopy;;    Current Outpatient Medications  Medication Sig Dispense Refill  . Blood Glucose Calibration (GLUCOMETER DEX HIGH CONTROL) LIQD Test CBG's once daily.     . Blood Glucose Monitoring Suppl (TRUE METRIX AIR GLUCOSE METER) w/Device KIT Check BS daily Dx E11.9 1 kit 0  . Cholecalciferol (VITAMIN D) 50 MCG (2000 UT) tablet Take 4,000 Units by mouth daily.    . ferrous sulfate 325 (65 FE) MG tablet Take 1 tablet (325 mg total) by mouth 2 (two) times daily with a meal. 60 tablet 1  . glucose blood (TRUE METRIX BLOOD GLUCOSE TEST) test strip Check BS daily Dx E11.9 100 each 3  . lisinopril (ZESTRIL) 10 MG tablet Take 1 tablet by mouth once daily (Patient taking differently: Take 10 mg by mouth daily.) 90 tablet 0  . lovastatin (MEVACOR) 20 MG tablet Take 1 tablet (20 mg total) by mouth at bedtime. 90 tablet 3  . metFORMIN (GLUCOPHAGE) 1000 MG tablet Take 1 tablet (1,000 mg total) by mouth 2 (two) times daily with a meal. 180 tablet 3  . nadolol (CORGARD) 20 MG tablet Take 1 tablet (20 mg total) by mouth daily. 30 tablet 3  . oxyCODONE-acetaminophen (PERCOCET) 10-325 MG tablet Take 1 tablet by mouth every 6 (six) hours as needed for pain.    . pantoprazole (PROTONIX) 40 MG tablet  Take 1 tablet (40 mg total) by mouth daily. 30 tablet 5  . potassium chloride SA (KLOR-CON) 20 MEQ tablet Take 2 tablets (40 mEq total) by mouth 2 (two) times daily. 360 tablet 3  . torsemide (DEMADEX) 20 MG tablet Take 3 tablets (60 mg total) by mouth 2 (two) times daily. (Patient taking differently: Take 40 mg by mouth 2 (two) times daily.) 180 tablet 11  . TRUEplus Lancets 30G MISC Check BS daily Dx E11.9 100 each 3   No current facility-administered medications for this visit.    Allergies as of 07/01/2020 - Review Complete 07/01/2020  Allergen Reaction Noted  . Keflex [cephalexin] Nausea And Vomiting 04/17/2019    Family History  Problem Relation Age of Onset  . Diabetes Father   . Heart disease Father 27       MI  . Hypertension Father   . Anemia Mother        Transfusion dependent  . COPD Sister   . Cancer  Paternal Grandmother 84       Pancreatic    Social History   Socioeconomic History  . Marital status: Widowed    Spouse name: Not on file  . Number of children: 2  . Years of education: 54  . Highest education level: Not on file  Occupational History  . Occupation: Retired   Tobacco Use  . Smoking status: Former Smoker    Packs/day: 1.50    Years: 40.00    Pack years: 60.00    Types: Cigarettes    Quit date: 04/29/2015    Years since quitting: 5.1  . Smokeless tobacco: Never Used  . Tobacco comment: Quit smoking 04/2015- Previous 1.5 ppd smoker  Vaping Use  . Vaping Use: Never used  Substance and Sexual Activity  . Alcohol use: No    Alcohol/week: 0.0 standard drinks  . Drug use: No  . Sexual activity: Not Currently    Birth control/protection: Post-menopausal  Other Topics Concern  . Not on file  Social History Narrative  . Not on file   Social Determinants of Health   Financial Resource Strain: Low Risk   . Difficulty of Paying Living Expenses: Not very hard  Food Insecurity: Not on file  Transportation Needs: No Transportation Needs  . Lack of  Transportation (Medical): No  . Lack of Transportation (Non-Medical): No  Physical Activity: Inactive  . Days of Exercise per Week: 0 days  . Minutes of Exercise per Session: 0 min  Stress: Not on file  Social Connections: Not on file    Subjective: Review of Systems  Constitutional: Positive for malaise/fatigue. Negative for chills and fever.  HENT: Negative for congestion and hearing loss.   Eyes: Negative for blurred vision and double vision.  Respiratory: Negative for cough and shortness of breath.   Cardiovascular: Negative for chest pain and palpitations.  Gastrointestinal: Negative for abdominal pain, blood in stool, constipation, diarrhea, heartburn, melena and vomiting.  Genitourinary: Negative for dysuria and urgency.  Musculoskeletal: Negative for joint pain and myalgias.  Skin: Negative for itching and rash.  Neurological: Negative for dizziness and headaches.  Psychiatric/Behavioral: Negative for depression. The patient is not nervous/anxious.      Objective: BP (!) 120/59   Pulse (!) 101   Temp (!) 97.1 F (36.2 C) (Temporal)   Ht 5' 2"  (1.575 m)   Wt 292 lb 6.4 oz (132.6 kg)   BMI 53.48 kg/m  Physical Exam Constitutional:      Appearance: Normal appearance.  HENT:     Head: Normocephalic and atraumatic.  Eyes:     Extraocular Movements: Extraocular movements intact.     Conjunctiva/sclera: Conjunctivae normal.  Cardiovascular:     Rate and Rhythm: Normal rate and regular rhythm.  Pulmonary:     Effort: Pulmonary effort is normal.     Breath sounds: Normal breath sounds.  Abdominal:     General: Bowel sounds are normal.     Palpations: Abdomen is soft.  Musculoskeletal:        General: No swelling. Normal range of motion.     Cervical back: Normal range of motion and neck supple.  Skin:    General: Skin is warm and dry.     Coloration: Skin is not jaundiced.  Neurological:     General: No focal deficit present.     Mental Status: She is alert  and oriented to person, place, and time.  Psychiatric:        Mood and Affect: Mood normal.  Behavior: Behavior normal.      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  We will schedule her for EGD with capsule deployment to further evaluate potential small bowel as source of chronic GI blood loss including AVMs, polyps, malignancy, or other.  Agree with hematology referral.  Patient does note her mother had issues with anemia later in life requiring transfusions monthly.  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  We will likely need to perform full serological work-up in regards to this, but will hold off today until we further evaluate her anemia.  Esophageal varices are grade 1, does not appear that these have bled based on endoscopic evaluation.  Patient is currently on nonselective beta-blocker.  Colonoscopy recall January 2023  07/01/2020 10:29 AM   Disclaimer: This note was dictated with voice recognition software. Similar sounding words can inadvertently be transcribed and may not be corrected upon review.

## 2020-07-02 NOTE — Telephone Encounter (Signed)
Called pt, LMOVM to call back to schedule

## 2020-07-02 NOTE — Telephone Encounter (Signed)
Received call that PA for givens capsule study was approved. Auth# [id number] dos 07/01/2020-09/28/2020 PA for EGD was approved via [employer] auth# [id number] DOS 07/02/2020-08/01/2020  Called pt to schedule EGD w/ givens capsule , asa 3/4, ASAP but had to LMOVM to call back

## 2020-07-03 ENCOUNTER — Encounter: Payer: Self-pay | Admitting: Family Medicine

## 2020-07-06 NOTE — Telephone Encounter (Signed)
Received VM from pt. Called her back and she stated she was another call with another doctor's office and would call back

## 2020-07-06 NOTE — Telephone Encounter (Signed)
Letter mailed for pt to call to schedule

## 2020-07-07 ENCOUNTER — Encounter: Payer: Self-pay | Admitting: *Deleted

## 2020-07-07 NOTE — Telephone Encounter (Signed)
Spoke with pt. She has been scheduled for 5/3. Aware will mail prep instructions with pre-op/covid test appt. Confirmed mailing address.

## 2020-07-08 ENCOUNTER — Ambulatory Visit (INDEPENDENT_AMBULATORY_CARE_PROVIDER_SITE_OTHER): Payer: Medicare PPO | Admitting: *Deleted

## 2020-07-08 DIAGNOSIS — D500 Iron deficiency anemia secondary to blood loss (chronic): Secondary | ICD-10-CM

## 2020-07-08 DIAGNOSIS — F339 Major depressive disorder, recurrent, unspecified: Secondary | ICD-10-CM

## 2020-07-08 NOTE — Chronic Care Management (AMB) (Signed)
Chronic Care Management   CCM RN Visit Note  07/08/2020 Name: Savannah Burns MRN: [id number] DOB: Nov 23, 1950  Subjective: Savannah Burns is a 69 y.o. year old female who is a primary care patient of [name], DO. The care management team was consulted for assistance with disease management and care coordination needs.    Engaged with patient by telephone for follow up visit in response to provider referral for case management and/or care coordination services.   Consent to Services:  The patient was given information about Chronic Care Management services, agreed to services, and gave verbal consent prior to initiation of services.  Please see initial visit note for detailed documentation.   Patient agreed to services and verbal consent obtained.   Assessment: Review of patient past medical history, allergies, medications, health status, including review of consultants reports, laboratory and other test data, was performed as part of comprehensive evaluation and provision of chronic care management services.   SDOH (Social Determinants of Health) assessments and interventions performed:    CCM Care Plan  Allergies  Allergen Reactions  . Keflex [Cephalexin] Nausea And Vomiting    Outpatient Encounter Medications as of 07/08/2020  Medication Sig  . Blood Glucose Calibration (GLUCOMETER DEX HIGH CONTROL) LIQD Test CBG's once daily.   . Blood Glucose Monitoring Suppl (TRUE METRIX AIR GLUCOSE METER) w/Device KIT Check BS daily Dx E11.9  . Cholecalciferol (VITAMIN D) 50 MCG (2000 UT) tablet Take 4,000 Units by mouth daily.  . ferrous sulfate 325 (65 FE) MG tablet Take 1 tablet (325 mg total) by mouth 2 (two) times daily with a meal.  . glucose blood (TRUE METRIX BLOOD GLUCOSE TEST) test strip Check BS daily Dx E11.9  . lisinopril (ZESTRIL) 10 MG tablet Take 1 tablet by mouth once daily (Patient taking differently: Take 10 mg by mouth daily.)  . lovastatin (MEVACOR) 20  MG tablet Take 1 tablet (20 mg total) by mouth at bedtime.  . metFORMIN (GLUCOPHAGE) 1000 MG tablet Take 1 tablet (1,000 mg total) by mouth 2 (two) times daily with a meal.  . nadolol (CORGARD) 20 MG tablet Take 1 tablet (20 mg total) by mouth daily.  Marland Kitchen oxyCODONE-acetaminophen (PERCOCET) 10-325 MG tablet Take 1 tablet by mouth every 6 (six) hours as needed for pain.  . pantoprazole (PROTONIX) 40 MG tablet Take 1 tablet (40 mg total) by mouth daily.  . potassium chloride SA (KLOR-CON) 20 MEQ tablet Take 2 tablets (40 mEq total) by mouth 2 (two) times daily.  Marland Kitchen torsemide (DEMADEX) 20 MG tablet Take 3 tablets (60 mg total) by mouth 2 (two) times daily. (Patient taking differently: Take 40 mg by mouth 2 (two) times daily.)  . TRUEplus Lancets 30G MISC Check BS daily Dx E11.9  . [DISCONTINUED] albuterol (PROVENTIL HFA;VENTOLIN HFA) 108 (90 Base) MCG/ACT inhaler Inhale 2 puffs into the lungs every 6 (six) hours as needed for wheezing or shortness of breath.   No facility-administered encounter medications on file as of 07/08/2020.    Patient Active Problem List   Diagnosis Date Noted  . Hypoalbuminemia 05/17/2020  . Moderate protein malnutrition ([place]) 05/17/2020  . Melena 05/16/2020  . Hyperlipidemia   . Hypokalemia   . GI bleed 03/20/2020  . Lower GI bleed 03/19/2020  . Acute on chronic congestive heart failure ([place])   . Symptomatic anemia   . Iron deficiency anemia   . Heme positive stool   . ABLA (acute blood loss anemia) 01/22/2020  . Supplemental oxygen dependent  10/22/2019  . Chronic dyspnea 10/22/2019  . Hypertension associated with diabetes ([place]) 10/22/2019  . Bilateral hand pain 09/21/2016  . Fatigue 07/30/2015  . Chronic diastolic HF (heart failure) ([place]) 06/29/2015  . Postmenopausal bleeding 04/17/2015  . Excessive daytime sleepiness 12/19/2014  . Swelling of lower extremity 11/27/2014  . Back pain 06/13/2013  . Sinusitis, chronic 05/30/2012  . Allergic rhinitis 06/06/2011   . Hyperlipidemia associated with type 2 diabetes mellitus ([place]) 03/24/2009  . Diabetes mellitus, type 2 ([place]) 08/12/2008  . Pulmonary nodule 08/29/2007  . COPD (chronic obstructive pulmonary disease) ([place]) 02/14/2007  . Class 3 obesity 05/25/2006  . TOBACCO DEPENDENCE 05/25/2006  . Depression, recurrent ([place]) 05/25/2006  . HYPERTENSION, BENIGN SYSTEMIC 05/25/2006  . DJD, UNSPECIFIED 05/25/2006    Conditions to be addressed/monitored:Depression and cirrhosis, iron deficiency anemia  Care Plan : Iron Deficiency (Adult)  Updates made by Ilean China, RN since 07/08/2020 12:00 AM    Problem: Iron Deficiency   Priority: High  Note:   Iron deficiency anemia in a patient with depression, Obesity, DM, HTN, COPD, DJD, HLD, CHF    Goal: Anemia Management   Start Date: 02/28/2020  This Visit's Progress: Not on track  Recent Progress: Not on track  Priority: High  Note:   Current Barriers:  . Chronic Disease Management support and education needs related to anemia in a patient with Depression, Obesity, DM, HTN, COPD, DJD, HLD, CHF, hepatic cirrhosis  . Cause of anemia unkown . Financial constraints  Nurse Case Manager Clinical Goal(s):  Marland Kitchen Patient will follow-up with gastroenterologist to discuss plan for anemia management . Patient will have a consultation with hematologist regarding anemia . Patient will talk with [facility] regarding self-management of anemia  Interventions:  . Inter-disciplinary care team collaboration (see longitudinal plan of care) . Chart reviewed including recent office notes, surgical reports, pathology reports, and lab results . Reviewed medications . Discussed results of recent colonoscopies, endoscopies, and xray o Recent use of camera to visualize small intestine was unsuccessful but advised patient that xray did show that it was not retained . Discussed hemoglobin trend  . Encouraged to seek appropriate medical attention with any new or worsening symptoms   . Reinforced an iron rich diet . Reviewed upcoming appointments o Hematology consult 07/09/20 o Endoscopy (to place camera in small intestine) 07/28/20 . Therapeutic listening utilized o Patient is frustrated that the cause of anemia hasn't been found. She doesn't want to have any more transfusions if a cause can't be identified.  . Encouraged patient to reach out to [facility] as needed . Advised patient to seek emergency medical attention if necessary  Patient Self Care Activities:  . Self administers medications as prescribed . Call provider office for new concerns or questions . Able to perform ADLs and IADLs independently  Patient Goals: Over the next 30 days, patient will: . Keep appt with hematologist on 07/09/20 . Have endoscopy on 07/28/20 . Call GI with any new or worsening symptoms . set a daily activity goal . take a stool softener to prevent constipation . don't eat dairy products, such as milk, cheese and ice cream with those high in iron . eat 3 to 5 servings of fruits and vegetables each day . eat dark, leafy greens, such as spinach, chard and collards . include high-iron food, such as meat, chicken, fish, shellfish, dried fruit, beans and nuts in each meal . pair iron-rich foods with foods high in vitamin C, such as oranges, strawberries and  red peppers to help absorption    Follow Up Plan:  . Telephone follow up appointment with care management team member scheduled for: 07/14/20 with RNCM . The patient has been provided with contact information for the care management team and has been advised to call with any health related questions or concerns.  . Next PCP appointment scheduled for: 09/04/20 with Dr Lajuana Ripple . Next GI appointment schedule for: endoscopy on 07/28/20 . Hematology consult scheduled for: 07/09/20  [name], BSN, RN-BC [place] [facility] [place] [facility] Direct Dial: [phone]

## 2020-07-08 NOTE — Patient Instructions (Signed)
Visit Information  PATIENT GOALS: Goals Addressed            This Visit's Progress   . Anemia Improved   Not on track    Timeframe:  Short-Term Goal Priority:  High Start Date:                             Expected End Date:                       Follow-up: 07/14/20  . Keep appt with hematologist on 07/09/20 . Have endoscopy on 07/28/20 . Call GI with any new or worsening symptoms . set a daily activity goal . take a stool softener to prevent constipation . don't eat dairy products, such as milk, cheese and ice cream with those high in iron . eat 3 to 5 servings of fruits and vegetables each day . eat dark, leafy greens, such as spinach, chard and collards . include high-iron food, such as meat, chicken, fish, shellfish, dried fruit, beans and nuts in each meal . pair iron-rich foods with foods high in vitamin C, such as oranges, strawberries and red peppers to help absorption       Patient verbalizes understanding of instructions provided today and agrees to view in [place].   Follow Up Plan:  . Telephone follow up appointment with care management team member scheduled for: 07/14/20 with RNCM . The patient has been provided with contact information for the care management team and has been advised to call with any health related questions or concerns.  . Next PCP appointment scheduled for: 09/04/20 with Dr Lajuana Ripple . Next GI appointment schedule for: endoscopy on 07/28/20 . Hematology consult scheduled for: 07/09/20  [name], BSN, RN-BC [place] [facility] [place] [facility] Direct Dial: [phone]

## 2020-07-08 NOTE — Progress Notes (Signed)
[place], [place] [address]   CLINIC:  Medical Oncology/Hematology  CONSULT NOTE  Patient Care Team: [name], DO as PCP - General (Family Medicine) Harl Bowie, Alphonse Guild, MD as PCP - Cardiology (Cardiology) Zonia Kief, MD (Rehabilitation) Chesley Mires, MD as Consulting Physician (Pulmonary Disease) Shea Evans [name], LCSW as [facility] (Licensed Clinical Social Worker) Cori Razor, Delice Bison, RN as Case Manager [place], Elon Alas, DO as Consulting Physician (Internal Medicine)  CHIEF COMPLAINTS/PURPOSE OF CONSULTATION:  Iron deficiency anemia du to GI bleed  HISTORY OF PRESENTING ILLNESS:  Savannah Burns 70 y.o. female is here at the request of her PCP (Savannah Burns) for persistent iron deficiency anemia with a history of prior GI bleeds.  She has had multiple ED admissions in the past 12 months (October 2021, December 2021, February 2022) for anemia and GI bleeding in the setting of liver cirrhosis.   Patient describes her episodes as having black and tarry stools, but she did not originally know it was a problem.  She had gone to her cardiologist with severe fatigue and lethargy - hemoglobin was found to be 3.8, and she was sent to the ED.  She has had 8 blood transfusions.  Before the GI bleeding started in October 2021, her hemoglobin was within normal range.  She has had extensive endoscopic work-up, but source of chronic anemia has yet to be identified.  Last EGD (05/18/2020) showed grade 1 esophageal varices.  Last colonoscopy (January 2022) showed nonbleeding internal hemorrhoids, diverticulosis in the sigmoid and descending colon, and polypectomy.  Capsule endoscopy was attempted, but the camera did not leave her stomach for over 7 hours, making the test unable to be read.  Small bowel endoscopy is scheduled for May.  At PCP appointment with Savannah Burns of [place] she was noted to have Hgb 7.7 (06/24/2020).  She was reportedly taking oral iron and having improved energy. She has not had any more dark tarry stool.  She reports slightly improved energy, about 70% of what she was prior to the bleeding.  She does have dyspnea on exertion.  No shortness of breath at rest or chest pain; no syncope or palpitation.   She had not noticed any recent bleeding such as epistaxis, hematuria, hematochezia, or melena. The patient denies over the counter NSAID ingestion. She is not on antiplatelets agents. She denies any pica and eats a variety of diet. The patient was prescribed oral iron supplements and she takes 325 mg twice daily.  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  She does not smoke, drink alcohol, or use any illicit drugs.  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   Paternal grandmother died at 63 due to unspecified abdominal cancer.  Her mother had unspecified anemia, required blood transfusions in the last few years of her life.  MEDICAL HISTORY:  Past Medical History:  Diagnosis Date  . Adrenal adenoma, left    Stable  . Anxiety   . Arthritis    bilateral hands  . Depression   . Diabetes mellitus, type 2 ([facility]) 08/12/2008   Qualifier: Diagnosis of  By: Deborra [place]ina MD, Tanja [place]    . Dyspnea   . Esophageal varices (  HCC)   . Grade II diastolic dysfunction   . History of kidney stones   . Hyperlipidemia   . Hypertension   . Lower back pain   . Lower GI bleed 03/19/2020  . Panic attacks   . Pneumonia    currently taking antibiotic and prednisone for early stages of pneumonia  . Pulmonary nodules    bilateral  . Skin cancer    face    SURGICAL HISTORY: Past Surgical History:   Procedure Laterality Date  . BIOPSY  04/07/2020   Procedure: BIOPSY;  Surgeon: Eloise Harman, DO;  [facility];  Service: Endoscopy;;  . Breast Cystectomy  Right   . CESAREAN SECTION    . COLONOSCOPY WITH PROPOFOL N/A 01/25/2020   Dr. Abbey Chatters: Nonbleeding internal hemorrhoids, diverticulosis, 5 mm polyp removed from the ascending colon, 10 mm polyp removed from the sigmoid colon, 30 mm polyp (tubulovillous adenoma with no high-grade dysplasia) removed from the transverse colon via piecemeal status post tattoo.  Other polyps were tubular adenomas.  3 month surveillance colonoscopy recommended.  . COLONOSCOPY WITH PROPOFOL N/A 04/07/2020   Procedure: COLONOSCOPY WITH PROPOFOL;  Surgeon: Eloise Harman, DO;  [facility];  Service: Endoscopy;  Laterality: N/A;  3:00pm, pt knows new time per office  . CYSTOSCOPY/URETEROSCOPY/HOLMIUM LASER/STENT PLACEMENT Bilateral 03/01/2019   Procedure: CYSTOSCOPY/RETROGRADEURETEROSCOPY/HOLMIUM LASER/STENT PLACEMENT;  Surgeon: Ceasar Mons, MD;  Location: [facility];  Service: Urology;  Laterality: Bilateral;  ONLY NEEDS 60 MIN  . ESOPHAGOGASTRODUODENOSCOPY (EGD) WITH PROPOFOL N/A 01/25/2020   Dr. Abbey Chatters: 4 columns grade 1 esophageal varices  . ESOPHAGOGASTRODUODENOSCOPY (EGD) WITH PROPOFOL N/A 05/18/2020   Procedure: ESOPHAGOGASTRODUODENOSCOPY (EGD) WITH PROPOFOL;  Surgeon: Eloise Harman, DO;  [facility];  Service: Endoscopy;  Laterality: N/A;  . GIVENS CAPSULE STUDY N/A 05/18/2020   Procedure: GIVENS CAPSULE STUDY;  Surgeon: Harvel Quale, MD;  [facility];  Service: Gastroenterology;  Laterality: N/A;  . POLYPECTOMY  01/25/2020   Procedure: POLYPECTOMY;  Surgeon: Eloise Harman, DO;  [facility];  Service: Endoscopy;;  . POLYPECTOMY  04/07/2020   Procedure: POLYPECTOMY INTESTINAL;  Surgeon: Eloise Harman, DO;  [facility];  Service: Endoscopy;;  . SKIN  CANCER EXCISION     Face  . SPINE SURGERY    . SUBMUCOSAL TATTOO INJECTION  01/25/2020   Procedure: SUBMUCOSAL TATTOO INJECTION;  Surgeon: Eloise Harman, DO;  [facility];  Service: Endoscopy;;    SOCIAL HISTORY: Social History   Socioeconomic History  . Marital status: Widowed    Spouse name: Not on file  . Number of children: 2  . Years of education: 24  . Highest education level: Not on file  Occupational History  . Occupation: Retired   Tobacco Use  . Smoking status: Former Smoker    Packs/day: 1.50    Years: 40.00    Pack years: 60.00    Types: Cigarettes    Quit date: 04/29/2015    Years since quitting: 5.2  . Smokeless tobacco: Never Used  . Tobacco comment: Quit smoking 04/2015- Previous 1.5 ppd smoker  Vaping Use  . Vaping Use: Never used  Substance and Sexual Activity  . Alcohol use: No    Alcohol/week: 0.0 standard drinks  . Drug use: No  . Sexual activity: Not Currently    Birth control/protection: Post-menopausal  Other Topics Concern  . Not on file  Social History Narrative   Her 16 year  old granddaughter lives with her - one daughter lives nearby, but she doesn't have a good relationship with her. Has a great relationship with other daughter who lives 1.5 hrs away - talks to her daily on the phone.   Social Determinants of Health   Financial Resource Strain: Low Risk   . Difficulty of Paying Living Expenses: Not very hard  Food Insecurity: No Food Insecurity  . Worried About Charity fundraiser in the Last Year: Never true  . Ran Out of Food in the Last Year: Never true  Transportation Needs: No Transportation Needs  . Lack of Transportation (Medical): No  . Lack of Transportation (Non-Medical): No  Physical Activity: Inactive  . Days of Exercise per Week: 0 days  . Minutes of Exercise per Session: 0 min  Stress: No Stress Concern Present  . Feeling of Stress : Not at all  Social Connections: Socially Isolated  . Frequency of  Communication with Friends and Family: More than three times a week  . Frequency of Social Gatherings with Friends and Family: Once a week  . Attends Religious Services: Never  . Active Member of Clubs or Organizations: No  . Attends Archivist Meetings: Never  . Marital Status: Widowed  Intimate Partner Violence: Not At Risk  . Fear of Current or Ex-Partner: No  . Emotionally Abused: No  . Physically Abused: No  . Sexually Abused: No    FAMILY HISTORY: Family History  Problem Relation Age of Onset  . Diabetes Father   . Heart disease Father 14       MI  . Hypertension Father   . Anemia Mother        Transfusion dependent  . COPD Sister   . Cancer Paternal Grandmother 55       Pancreatic    ALLERGIES:  is allergic to keflex [cephalexin].  MEDICATIONS:  Current Outpatient Medications  Medication Sig Dispense Refill  . Blood Glucose Calibration (GLUCOMETER DEX HIGH CONTROL) LIQD Test CBG's once daily.     . Blood Glucose Monitoring Suppl (TRUE METRIX AIR GLUCOSE METER) w/Device KIT Check BS daily Dx E11.9 1 kit 0  . Cholecalciferol (VITAMIN D) 50 MCG (2000 UT) tablet Take 4,000 Units by mouth daily.    . ferrous sulfate 325 (65 FE) MG tablet Take 1 tablet (325 mg total) by mouth 2 (two) times daily with a meal. (Patient taking differently: Take 325 mg by mouth daily with breakfast.) 60 tablet 1  . glucose blood (TRUE METRIX BLOOD GLUCOSE TEST) test strip Check BS daily Dx E11.9 100 each 3  . lisinopril (ZESTRIL) 10 MG tablet Take 1 tablet by mouth once daily (Patient taking differently: Take 10 mg by mouth daily.) 90 tablet 0  . lovastatin (MEVACOR) 20 MG tablet Take 1 tablet (20 mg total) by mouth at bedtime. 90 tablet 3  . metFORMIN (GLUCOPHAGE) 1000 MG tablet Take 1 tablet (1,000 mg total) by mouth 2 (two) times daily with a meal. 180 tablet 3  . nadolol (CORGARD) 20 MG tablet Take 1 tablet (20 mg total) by mouth daily. 30 tablet 3  . oxyCODONE-acetaminophen  (PERCOCET) 10-325 MG tablet Take 1 tablet by mouth every 6 (six) hours as needed for pain.    . pantoprazole (PROTONIX) 40 MG tablet Take 1 tablet (40 mg total) by mouth daily. 30 tablet 5  . potassium chloride SA (KLOR-CON) 20 MEQ tablet Take 2 tablets (40 mEq total) by mouth 2 (two) times daily. (Patient taking  differently: Take 40 mEq by mouth daily.) 360 tablet 3  . TRUEplus Lancets 30G MISC Check BS daily Dx E11.9 100 each 3  . OXYGEN Inhale 3 L into the lungs continuous.    . sodium chloride (OCEAN) 0.65 % SOLN nasal spray Place 1 spray into both nostrils as needed for congestion.    . torsemide (DEMADEX) 20 MG tablet Take 3 tablets (60 mg total) by mouth 2 (two) times daily. (Patient taking differently: Take 50 mg by mouth 2 (two) times daily.) 180 tablet 11  . vitamin B-12 (CYANOCOBALAMIN) 1000 MCG tablet Take 2,000 mcg by mouth daily.     No current facility-administered medications for this visit.    REVIEW OF SYSTEMS:   Review of Systems  Constitutional: Positive for fatigue. Negative for appetite change, chills, diaphoresis, fever and unexpected weight change.  HENT:   Negative for lump/mass and nosebleeds.   Eyes: Negative for eye problems.  Respiratory: Positive for shortness of breath (with exertion). Negative for cough and hemoptysis.   Cardiovascular: Positive for leg swelling. Negative for chest pain and palpitations.  Gastrointestinal: Negative for abdominal pain, blood in stool, constipation, diarrhea, nausea and vomiting.  Genitourinary: Negative for hematuria.   Skin: Negative.   Neurological: Negative for dizziness, headaches and light-headedness.  Hematological: Does not bruise/bleed easily.      PHYSICAL EXAMINATION: ECOG PERFORMANCE STATUS: 2 - Symptomatic, <50% confined to bed  Vitals:   07/09/20 1028  BP: (!) 119/52  Pulse: 86  Resp: 17  Temp: 98.3 F (36.8 C)  SpO2: 98%   There were no vitals filed for this visit.  Physical Exam Constitutional:       Appearance: Normal appearance. She is obese.     Comments: Nasal cannula in place;  wheelchair  HENT:     Head: Normocephalic and atraumatic.     Mouth/Throat:     Mouth: Mucous membranes are moist.  Eyes:     Extraocular Movements: Extraocular movements intact.     Pupils: Pupils are equal, round, and reactive to light.  Cardiovascular:     Rate and Rhythm: Normal rate and regular rhythm.     Pulses: Normal pulses.     Heart sounds: Normal heart sounds.  Pulmonary:     Effort: Pulmonary effort is normal.     Breath sounds: Rales (Crackles in bilateral lung bases) present.  Abdominal:     General: Bowel sounds are normal.     Palpations: Abdomen is soft.     Tenderness: There is no abdominal tenderness.  Musculoskeletal:        General: No swelling.     Right lower leg: Edema present.     Left lower leg: Edema present.     Comments: 2+ pretibial pitting edema of bilateral lower extremities  Lymphadenopathy:     Cervical: No cervical adenopathy.  Skin:    General: Skin is warm and dry.  Neurological:     General: No focal deficit present.     Mental Status: She is alert and oriented to person, place, and time.  Psychiatric:        Mood and Affect: Mood normal.        Behavior: Behavior normal.       LABORATORY DATA:  I have reviewed the data as listed Recent Results (from the past 2160 hour(s))  Hemoglobin, fingerstick     Status: Abnormal   Collection Time: 05/15/20  4:22 PM  Result Value Ref Range   Hemoglobin 6.7 (LL)  11.1 - 15.9 g/dL  Comprehensive metabolic panel     Status: Abnormal   Collection Time: 05/16/20 12:06 PM  Result Value Ref Range   Sodium 137 135 - 145 mmol/L   Potassium 3.4 (L) 3.5 - 5.1 mmol/L   Chloride 101 98 - 111 mmol/L   CO2 30 22 - 32 mmol/L   Glucose, Bld 119 (H) 70 - 99 mg/dL    Comment: Glucose reference range applies only to samples taken after fasting for at least 8 hours.   BUN 10 8 - 23 mg/dL   Creatinine, Ser 0.57 0.44 -  1.00 mg/dL   Calcium 8.6 (L) 8.9 - 10.3 mg/dL   Total Protein 7.7 6.5 - 8.1 g/dL   Albumin 2.8 (L) 3.5 - 5.0 g/dL   AST 19 15 - 41 U/L   ALT 14 0 - 44 U/L   Alkaline Phosphatase 83 38 - 126 U/L   Total Bilirubin 0.3 0.3 - 1.2 mg/dL   GFR, Estimated >60 >60 mL/min    Comment: (NOTE) Calculated using the CKD-EPI Creatinine Equation (2021)    Anion gap 6 5 - 15    Comment: Performed at [facility], [address], [place], [place] [address]  CBC     Status: Abnormal   Collection Time: 05/16/20 12:06 PM  Result Value Ref Range   WBC 6.1 4.0 - 10.5 K/uL   RBC 2.89 (L) 3.87 - 5.11 MIL/uL   Hemoglobin 6.8 (LL) 12.0 - 15.0 g/dL    Comment: REPEATED TO VERIFY THIS CRITICAL RESULT HAS VERIFIED AND BEEN CALLED TO SHANNON MCCARTNEY BY HILLARY FLYNT ON 02 19 2022 AT 1231, AND HAS BEEN READ BACK.     HCT 24.7 (L) 36.0 - 46.0 %   MCV 85.5 80.0 - 100.0 fL   MCH 23.5 (L) 26.0 - 34.0 pg   MCHC 27.5 (L) 30.0 - 36.0 g/dL   RDW 19.3 (H) 11.5 - 15.5 %   Platelets 133 (L) 150 - 400 K/uL   nRBC 0.0 0.0 - 0.2 %    Comment: Performed at [facility], [address] [place], [place] [address]  Type and screen [facility]     Status: None   Collection Time: 05/16/20 12:06 PM  Result Value Ref Range   ABO/RH(D) A POS    Antibody Screen NEG    Sample Expiration      05/19/2020,2359 Performed at [facility], [address], [place],  [address]    Unit Number [id number]    Blood Component Type RED CELLS,LR    Unit division 00    Status of Unit ISSUED,FINAL    Transfusion Status OK TO TRANSFUSE    Crossmatch Result Compatible    Unit Number [id number]    Blood Component Type RED CELLS,LR    Unit division 00    Status of Unit ISSUED,FINAL    Transfusion Status OK TO TRANSFUSE    Crossmatch Result Compatible   BPAM RBC     Status: None   Collection Time: 05/16/20 12:06 PM  Result Value Ref Range   ISSUE DATE / TIME [id number]    Blood Product Unit Number [id number]     PRODUCT CODE [id number]    Unit Type and Rh 0600    Blood Product Expiration Date [id number]    ISSUE DATE / TIME [id number]    Blood Product Unit Number [id number]    PRODUCT CODE [id number]    Unit Type and Rh 6200    Blood  Product Expiration Date [id number]   POC occult blood, ED     Status: Abnormal   Collection Time: 05/16/20 12:55 PM  Result Value Ref Range   [id number] POSITIVE   Prepare RBC (crossmatch)     Status: None   Collection Time: 05/16/20  1:29 PM  Result Value Ref Range   Order Confirmation      ORDER PROCESSED BY BLOOD BANK Performed at [facility], [address] [place], [place] [address]   CBC     Status: Abnormal   Collection Time: 05/16/20  8:01 PM  Result Value Ref Range   WBC 6.1 4.0 - 10.5 K/uL   RBC 3.04 (L) 3.87 - 5.11 MIL/uL   Hemoglobin 7.4 (L) 12.0 - 15.0 g/dL    Comment: POST TRANSFUSION SPECIMEN   HCT 26.1 (L) 36.0 - 46.0 %   MCV 85.9 80.0 - 100.0 fL   MCH 24.3 (L) 26.0 - 34.0 pg   MCHC 28.4 (L) 30.0 - 36.0 g/dL   RDW 18.8 (H) 11.5 - 15.5 %   Platelets 117 (L) 150 - 400 K/uL   nRBC 0.0 0.0 - 0.2 %    Comment: Performed at [facility], [address] [place], [place] [address]  SARS CORONAVIRUS 2 (TAT 6-24 HRS) Nasopharyngeal Nasopharyngeal Swab     Status: None   Collection Time: 05/16/20 10:41 PM   Specimen: Nasopharyngeal Swab  Result Value Ref Range   SARS Coronavirus 2 NEGATIVE NEGATIVE    Comment: (NOTE) SARS-CoV-2 target nucleic acids are NOT DETECTED.  The SARS-CoV-2 RNA is generally detectable in upper and lower respiratory specimens during the acute phase of infection. Negative results do not preclude SARS-CoV-2 infection, do not rule out co-infections with other pathogens, and should not be used as the sole basis for treatment or other patient management decisions. Negative results must be combined with clinical observations, patient history, and epidemiological information. The expected result is Negative.  Fact  Sheet for Patients: SugarRoll.be  Fact Sheet for Healthcare Providers: https://www.woods-mathews.com/  This test is not yet approved or cleared by the [place] FDA and  has been authorized for detection and/or diagnosis of SARS-CoV-2 by FDA under an Emergency Use Authorization (EUA). This EUA will remain  in effect (meaning this test can be used) for the duration of the COVID-19 declaration under Se ction 564(b)(1) of the Act, 21 U.S.C. section 360bbb-3(b)(1), unless the authorization is terminated or revoked sooner.  Performed at [facility], [place] [address], [place], [place] [address]   CBC     Status: Abnormal   Collection Time: 05/17/20  5:24 AM  Result Value Ref Range   WBC 5.6 4.0 - 10.5 K/uL   RBC 3.17 (L) 3.87 - 5.11 MIL/uL   Hemoglobin 7.7 (L) 12.0 - 15.0 g/dL   HCT 27.5 (L) 36.0 - 46.0 %   MCV 86.8 80.0 - 100.0 fL   MCH 24.3 (L) 26.0 - 34.0 pg   MCHC 28.0 (L) 30.0 - 36.0 g/dL   RDW 18.7 (H) 11.5 - 15.5 %   Platelets 116 (L) 150 - 400 K/uL    Comment: SPECIMEN CHECKED FOR CLOTS Immature Platelet Fraction may be clinically indicated, consider ordering this additional test [id number] REPEATED TO VERIFY PLATELET COUNT CONFIRMED BY SMEAR    nRBC 0.0 0.0 - 0.2 %    Comment: Performed at [facility], [address] [place], [place] [address]  Comprehensive metabolic panel     Status: Abnormal   Collection Time:  05/17/20  5:24 AM  Result Value Ref Range   Sodium 136 135 - 145 mmol/L   Potassium 4.1 3.5 - 5.1 mmol/L    Comment: DELTA CHECK NOTED   Chloride 102 98 - 111 mmol/L   CO2 30 22 - 32 mmol/L   Glucose, Bld 115 (H) 70 - 99 mg/dL    Comment: Glucose reference range applies only to samples taken after fasting for at least 8 hours.   BUN 12 8 - 23 mg/dL   Creatinine, Ser 0.68 0.44 - 1.00 mg/dL   Calcium 8.1 (L) 8.9 - 10.3 mg/dL   Total Protein 7.8 6.5 - 8.1 g/dL   Albumin 2.7 (L) 3.5 - 5.0 g/dL   AST 23 15 -  41 U/L   ALT 14 0 - 44 U/L   Alkaline Phosphatase 82 38 - 126 U/L   Total Bilirubin 0.3 0.3 - 1.2 mg/dL   GFR, Estimated >60 >60 mL/min    Comment: (NOTE) Calculated using the CKD-EPI Creatinine Equation (2021)    Anion gap 4 (L) 5 - 15    Comment: Performed at [facility], [address] [place], [place] [address]  Magnesium     Status: None   Collection Time: 05/17/20  5:24 AM  Result Value Ref Range   Magnesium 1.8 1.7 - 2.4 mg/dL    Comment: Performed at [facility], [address], [place], [place] [address]  Phosphorus     Status: Abnormal   Collection Time: 05/17/20  5:24 AM  Result Value Ref Range   Phosphorus 4.9 (H) 2.5 - 4.6 mg/dL    Comment: Performed at [facility], [address], [place], [place] [address]  Protime-INR     Status: None   Collection Time: 05/17/20  5:32 AM  Result Value Ref Range   Prothrombin Time 13.6 11.4 - 15.2 seconds   INR 1.1 0.8 - 1.2    Comment: (NOTE) INR goal varies based on device and disease states. Performed at [facility], [address], [place], [place] [address]   SARS Coronavirus 2 by RT PCR (hospital order, performed in [facility] hospital lab) Nasopharyngeal Nasopharyngeal Swab     Status: None   Collection Time: 05/17/20 10:21 AM   Specimen: Nasopharyngeal Swab  Result Value Ref Range   SARS Coronavirus 2 NEGATIVE NEGATIVE    Comment: (NOTE) SARS-CoV-2 target nucleic acids are NOT DETECTED.  The SARS-CoV-2 RNA is generally detectable in upper and lower respiratory specimens during the acute phase of infection. The lowest concentration of SARS-CoV-2 viral copies this assay can detect is 250 copies / mL. A negative result does not preclude SARS-CoV-2 infection and should not be used as the sole basis for treatment or other patient management decisions.  A negative result may occur with improper specimen collection / handling, submission of specimen other than nasopharyngeal swab, presence of viral mutation(s) within  the areas targeted by this assay, and inadequate number of viral copies (<250 copies / mL). A negative result must be combined with clinical observations, patient history, and epidemiological information.  Fact Sheet for Patients:   StrictlyIdeas.no  Fact Sheet for Healthcare Providers: BankingDealers.co.za  This test is not yet approved or  cleared by the [place] FDA and has been authorized for detection and/or diagnosis of SARS-CoV-2 by FDA under an Emergency Use Authorization (EUA).  This EUA will remain in effect (meaning this test can be used) for the duration of the COVID-19 declaration under Section 564(b)(1) of the Act,  21 U.S.C. section 360bbb-3(b)(1), unless the authorization is terminated or revoked sooner.  Performed at [facility], [address] [place], [place] [address]   Prepare RBC (crossmatch)     Status: None   Collection Time: 05/17/20 10:50 AM  Result Value Ref Range   Order Confirmation      ORDER PROCESSED BY BLOOD BANK Performed at [facility], [address], [place], [place] [address]   CBG monitoring, ED     Status: Abnormal   Collection Time: 05/17/20 12:34 PM  Result Value Ref Range   Glucose-Capillary 125 (H) 70 - 99 mg/dL    Comment: Glucose reference range applies only to samples taken after fasting for at least 8 hours.  Glucose, capillary     Status: Abnormal   Collection Time: 05/17/20  4:47 PM  Result Value Ref Range   Glucose-Capillary 133 (H) 70 - 99 mg/dL    Comment: Glucose reference range applies only to samples taken after fasting for at least 8 hours.  Glucose, capillary     Status: None   Collection Time: 05/17/20 11:40 PM  Result Value Ref Range   Glucose-Capillary 99 70 - 99 mg/dL    Comment: Glucose reference range applies only to samples taken after fasting for at least 8 hours.  Glucose, capillary     Status: Abnormal   Collection Time: 05/18/20  5:41 AM  Result Value  Ref Range   Glucose-Capillary 116 (H) 70 - 99 mg/dL    Comment: Glucose reference range applies only to samples taken after fasting for at least 8 hours.  CBC     Status: Abnormal   Collection Time: 05/18/20  6:04 AM  Result Value Ref Range   WBC 4.9 4.0 - 10.5 K/uL   RBC 2.98 (L) 3.87 - 5.11 MIL/uL   Hemoglobin 7.4 (L) 12.0 - 15.0 g/dL   HCT 26.2 (L) 36.0 - 46.0 %   MCV 87.9 80.0 - 100.0 fL   MCH 24.8 (L) 26.0 - 34.0 pg   MCHC 28.2 (L) 30.0 - 36.0 g/dL   RDW 18.6 (H) 11.5 - 15.5 %   Platelets 103 (L) 150 - 400 K/uL    Comment: SPECIMEN CHECKED FOR CLOTS Immature Platelet Fraction may be clinically indicated, consider ordering this additional test [id number] CONSISTENT WITH PREVIOUS RESULT    nRBC 0.0 0.0 - 0.2 %    Comment: Performed at [facility], [address] [place], [place] [address]  Glucose, capillary     Status: Abnormal   Collection Time: 05/18/20 11:44 AM  Result Value Ref Range   Glucose-Capillary 116 (H) 70 - 99 mg/dL    Comment: Glucose reference range applies only to samples taken after fasting for at least 8 hours.  Hemoglobin and hematocrit, blood     Status: Abnormal   Collection Time: 05/18/20  6:14 PM  Result Value Ref Range   Hemoglobin 7.6 (L) 12.0 - 15.0 g/dL   HCT 27.3 (L) 36.0 - 46.0 %    Comment: Performed at [facility], [address], [place],  [address]  Glucose, capillary     Status: Abnormal   Collection Time: 05/19/20 12:28 AM  Result Value Ref Range   Glucose-Capillary 139 (H) 70 - 99 mg/dL    Comment: Glucose reference range applies only to samples taken after fasting for at least 8 hours.   Comment 1 Notify RN    Comment 2 Document in Chart   Hemoglobin and hematocrit, blood  Status: Abnormal   Collection Time: 05/19/20 12:50 AM  Result Value Ref Range   Hemoglobin 7.8 (L) 12.0 - 15.0 g/dL   HCT 27.1 (L) 36.0 - 46.0 %    Comment: Performed at [facility], [address] [place], [place] [address]  Glucose, capillary      Status: Abnormal   Collection Time: 05/19/20  4:49 AM  Result Value Ref Range   Glucose-Capillary 122 (H) 70 - 99 mg/dL    Comment: Glucose reference range applies only to samples taken after fasting for at least 8 hours.   Comment 1 Notify RN    Comment 2 Document in Chart   CBC Once     Status: Abnormal   Collection Time: 05/19/20  6:47 AM  Result Value Ref Range   WBC 6.4 4.0 - 10.5 K/uL   RBC 3.30 (L) 3.87 - 5.11 MIL/uL   Hemoglobin 8.2 (L) 12.0 - 15.0 g/dL   HCT 29.1 (L) 36.0 - 46.0 %   MCV 88.2 80.0 - 100.0 fL   MCH 24.8 (L) 26.0 - 34.0 pg   MCHC 28.2 (L) 30.0 - 36.0 g/dL   RDW 19.2 (H) 11.5 - 15.5 %   Platelets 111 (L) 150 - 400 K/uL    Comment: SPECIMEN CHECKED FOR CLOTS Immature Platelet Fraction may be clinically indicated, consider ordering this additional test [id number] CONSISTENT WITH PREVIOUS RESULT    nRBC 0.0 0.0 - 0.2 %    Comment: Performed at [facility], [address], [place], [place] [address]  Comprehensive metabolic panel Once     Status: Abnormal   Collection Time: 05/19/20  6:47 AM  Result Value Ref Range   Sodium 134 (L) 135 - 145 mmol/L   Potassium 3.6 3.5 - 5.1 mmol/L   Chloride 100 98 - 111 mmol/L   CO2 28 22 - 32 mmol/L   Glucose, Bld 123 (H) 70 - 99 mg/dL    Comment: Glucose reference range applies only to samples taken after fasting for at least 8 hours.   BUN 10 8 - 23 mg/dL   Creatinine, Ser 0.64 0.44 - 1.00 mg/dL   Calcium 8.1 (L) 8.9 - 10.3 mg/dL   Total Protein 8.1 6.5 - 8.1 g/dL   Albumin 2.9 (L) 3.5 - 5.0 g/dL   AST 25 15 - 41 U/L   ALT 15 0 - 44 U/L   Alkaline Phosphatase 82 38 - 126 U/L   Total Bilirubin 0.6 0.3 - 1.2 mg/dL   GFR, Estimated >60 >60 mL/min    Comment: (NOTE) Calculated using the CKD-EPI Creatinine Equation (2021)    Anion gap 6 5 - 15    Comment: Performed at [facility], [address], [place], [place] [address]  CBC with Differential/Platelet     Status: Abnormal   Collection Time: 06/05/20  2:56 PM   Result Value Ref Range   WBC 4.8 4.0 - 10.5 K/uL   RBC 3.49 (L) 3.87 - 5.11 MIL/uL   Hemoglobin 8.8 (L) 12.0 - 15.0 g/dL   HCT 30.6 (L) 36.0 - 46.0 %   MCV 87.7 80.0 - 100.0 fL   MCH 25.2 (L) 26.0 - 34.0 pg   MCHC 28.8 (L) 30.0 - 36.0 g/dL   RDW 20.8 (H) 11.5 - 15.5 %   Platelets 156 150 - 400 K/uL   nRBC 0.0 0.0 - 0.2 %   Neutrophils Relative % 51 %   Neutro Abs 2.4 1.7 - 7.7 K/uL   Lymphocytes Relative  37 %   Lymphs Abs 1.8 0.7 - 4.0 K/uL   Monocytes Relative 11 %   Monocytes Absolute 0.5 0.1 - 1.0 K/uL   Eosinophils Relative 1 %   Eosinophils Absolute 0.0 0.0 - 0.5 K/uL   Basophils Relative 0 %   Basophils Absolute 0.0 0.0 - 0.1 K/uL   Immature Granulocytes 0 %   Abs Immature Granulocytes 0.01 0.00 - 0.07 K/uL    Comment: Performed at [facility], [address], [place], [place] [address]  Hemoglobin, fingerstick     Status: Abnormal   Collection Time: 06/24/20  3:12 PM  Result Value Ref Range   Hemoglobin 7.7 (L) 11.1 - 15.9 g/dL  CBC with Differential/Platelet     Status: Abnormal   Collection Time: 06/24/20  3:29 PM  Result Value Ref Range   WBC 5.9 3.4 - 10.8 x10E3/uL   RBC 3.17 (L) 3.77 - 5.28 x10E6/uL   Hemoglobin 7.8 (LL) 11.1 - 15.9 g/dL    Comment:                   Client Requested Flag   Hematocrit 26.6 (L) 34.0 - 46.6 %   MCV 84 79 - 97 fL   MCH 24.6 (L) 26.6 - 33.0 pg   MCHC 29.3 (L) 31.5 - 35.7 g/dL   RDW 18.4 (H) 11.7 - 15.4 %   Platelets 149 (L) 150 - 450 x10E3/uL   Neutrophils 59 Not Estab. %   Lymphs 30 Not Estab. %   Monocytes 9 Not Estab. %   Eos 1 Not Estab. %   Basos 1 Not Estab. %   Neutrophils Absolute 3.5 1.4 - 7.0 x10E3/uL   Lymphocytes Absolute 1.8 0.7 - 3.1 x10E3/uL   Monocytes Absolute 0.5 0.1 - 0.9 x10E3/uL   EOS (ABSOLUTE) 0.1 0.0 - 0.4 x10E3/uL   Basophils Absolute 0.0 0.0 - 0.2 x10E3/uL   Immature Granulocytes 0 Not Estab. %   Immature Grans (Abs) 0.0 0.0 - 0.1 x10E3/uL  VITAMIN D 25 Hydroxy (Vit-D Deficiency, Fractures)      Status: None   Collection Time: 07/09/20 12:16 PM  Result Value Ref Range   Vit D, 25-Hydroxy 36.63 30 - 100 ng/mL    Comment: (NOTE) Vitamin D deficiency has been defined by the Institute of Medicine  and an Endocrine Society practice guideline as a level of serum 25-OH  vitamin D less than 20 ng/mL (1,2). The Endocrine Society went on to  further define vitamin D insufficiency as a level between 21 and 29  ng/mL (2).  1. IOM (Institute of Medicine). 2010. Dietary reference intakes for  calcium and D. [place]: The [employer]. 2. Holick MF, Binkley [place], Bischoff-Ferrari HA, et al. Evaluation,  treatment, and prevention of vitamin D deficiency: an Endocrine  Society clinical practice guideline, JCEM. 2011 Jul; 96(7): 1911-30.  Performed at [facility], [address], [place], [place] [address]   Folate     Status: None   Collection Time: 07/09/20 12:16 PM  Result Value Ref Range   Folate 6.9 >5.9 ng/mL    Comment: Performed at [facility], [address], [place], [place] [address]  Vitamin B12     Status: None   Collection Time: 07/09/20 12:16 PM  Result Value Ref Range   Vitamin B-12 227 180 - 914 pg/mL    Comment: (NOTE) This assay is not validated for testing neonatal or myeloproliferative syndrome specimens for Vitamin B12 levels. Performed at [facility], [address] [place], [place] [address]   Lactate dehydrogenase     Status: None   Collection Time: 07/09/20 12:16 PM  Result Value Ref Range   LDH 106 98 - 192 U/L    Comment: Performed at [facility], [address] [place], [place] [address]  Iron and TIBC     Status: Abnormal   Collection Time: 07/09/20 12:16 PM  Result Value Ref Range   Iron 16 (L) 28 - 170 ug/dL   TIBC 507 (H) 250 - 450 ug/dL   Saturation Ratios 3 (L) 10.4 - 31.8 %   UIBC 491 ug/dL    Comment: Performed at [facility], [address], [place], [place] [address]  Ferritin     Status: None   Collection Time:  07/09/20 12:16 PM  Result Value Ref Range   Ferritin 13 11 - 307 ng/mL    Comment: Performed at [facility], [address] [place], [place] [address]  Comprehensive metabolic panel     Status: Abnormal   Collection Time: 07/09/20 12:16 PM  Result Value Ref Range   Sodium 137 135 - 145 mmol/L   Potassium 3.6 3.5 - 5.1 mmol/L   Chloride 101 98 - 111 mmol/L   CO2 26 22 - 32 mmol/L   Glucose, Bld 100 (H) 70 - 99 mg/dL    Comment: Glucose reference range applies only to samples taken after fasting for at least 8 hours.   BUN 17 8 - 23 mg/dL   Creatinine, Ser 0.63 0.44 - 1.00 mg/dL   Calcium 8.5 (L) 8.9 - 10.3 mg/dL   Total Protein 8.6 (H) 6.5 - 8.1 g/dL   Albumin 3.0 (L) 3.5 - 5.0 g/dL   AST 24 15 - 41 U/L   ALT 16 0 - 44 U/L   Alkaline Phosphatase 91 38 - 126 U/L   Total Bilirubin 0.3 0.3 - 1.2 mg/dL   GFR, Estimated >60 >60 mL/min    Comment: (NOTE) Calculated using the CKD-EPI Creatinine Equation (2021)    Anion gap 10 5 - 15    Comment: Performed at [facility], [address] [place], [place] [address]  CBC with Differential/Platelet     Status: Abnormal   Collection Time: 07/09/20 12:16 PM  Result Value Ref Range   WBC 5.9 4.0 - 10.5 K/uL   RBC 2.80 (L) 3.87 - 5.11 MIL/uL   Hemoglobin 7.1 (L) 12.0 - 15.0 g/dL   HCT 25.6 (L) 36.0 - 46.0 %   MCV 91.4 80.0 - 100.0 fL   MCH 25.4 (L) 26.0 - 34.0 pg   MCHC 27.7 (L) 30.0 - 36.0 g/dL   RDW 18.6 (H) 11.5 - 15.5 %   Platelets 138 (L) 150 - 400 K/uL   nRBC 0.0 0.0 - 0.2 %   Neutrophils Relative % 56 %   Neutro Abs 3.4 1.7 - 7.7 K/uL   Lymphocytes Relative 33 %   Lymphs Abs 2.0 0.7 - 4.0 K/uL   Monocytes Relative 9 %   Monocytes Absolute 0.5 0.1 - 1.0 K/uL   Eosinophils Relative 1 %   Eosinophils Absolute 0.0 0.0 - 0.5 K/uL   Basophils Relative 0 %   Basophils Absolute 0.0 0.0 - 0.1 K/uL   Immature Granulocytes 1 %   Abs Immature Granulocytes 0.03 0.00 - 0.07 K/uL    Comment: Performed at [facility], [address], [place], [place] [address]  Methylmalonic acid, serum     Status: Abnormal  Collection Time: 07/09/20 12:16 PM  Result Value Ref Range   Methylmalonic Acid, Quantitative 439 (H) 0 - 378 nmol/L    Comment: (NOTE) This test was developed and its performance characteristics determined by [facility]. It has not been cleared or approved by the Food and Drug Administration. Performed At: [facility] [place], [place] [id number] Rush Farmer MD [id number]   Reticulocytes     Status: Abnormal   Collection Time: 07/09/20 12:16 PM  Result Value Ref Range   Retic Ct Pct 3.4 (H) 0.4 - 3.1 %   RBC. 2.79 (L) 3.87 - 5.11 MIL/uL   Retic Count, Absolute 96.0 19.0 - 186.0 K/uL   Immature Retic Fract 31.7 (H) 2.3 - 15.9 %    Comment: Performed at [facility], [address] [place], [place] [address]  Haptoglobin     Status: None   Collection Time: 07/09/20 12:16 PM  Result Value Ref Range   Haptoglobin 170 37 - 355 mg/dL    Comment: (NOTE) Performed At: [facility] [place], [place] [id number] Rush Farmer MD [id number]   Protein electrophoresis, serum     Status: Abnormal   Collection Time: 07/09/20 12:16 PM  Result Value Ref Range   Total Protein ELP 8.1 6.0 - 8.5 g/dL   Albumin ELP 3.0 2.9 - 4.4 g/dL   Alpha-1-Globulin 0.3 0.0 - 0.4 g/dL   Alpha-2-Globulin 0.7 0.4 - 1.0 g/dL   Beta Globulin 1.1 0.7 - 1.3 g/dL   Gamma Globulin 3.0 (H) 0.4 - 1.8 g/dL   M-Spike, % 2.6 (H) Not Observed g/dL   SPE Interp. Comment     Comment: (NOTE) The SPE pattern demonstrates a single peak (M-spike) in the gamma region which may represent monoclonal protein. This peak may also be caused by circulating immune complexes, cryoglobulins, C-reactive protein, fibrinogen or hemolysis.  If clinically indicated, the presence of a monoclonal gammopathy may be confirmed by immuno- fixation, as well as an evaluation of the urine for the presence of Bence-Jones  protein. Performed At: [facility] [place], [place] [id number] Rush Farmer MD [id number]    Comment Comment     Comment: (NOTE) Protein electrophoresis scan will follow via computer, mail, or courier delivery.    Globulin, Total 5.1 (H) 2.2 - 3.9 g/dL   A/G Ratio 0.6 (L) 0.7 - 1.7  Immunofixation electrophoresis     Status: Abnormal   Collection Time: 07/09/20 12:16 PM  Result Value Ref Range   Total Protein ELP 8.1 6.0 - 8.5 g/dL   IgG (Immunoglobin G), Serum 3,727 (H) 586 - 1,602 mg/dL   IgA 204 87 - 352 mg/dL   IgM (Immunoglobulin M), Srm 32 26 - 217 mg/dL    Comment: (NOTE) Performed At: [facility] [place], [place] [id number] Rush Farmer MD [id number]    Immunofixation Result, Serum Comment (A)     Comment: (NOTE) Immunofixation shows IgG monoclonal protein with kappa light chain specificity. Please note that samples from patients receiving DARZALEX(R) (daratumumab) or SARCLISA(R)(isatuximab-irfc) treatment can appear as an "IgG kappa" and mask a complete response (CR). If this patient is receiving these therapies, this IFE assay interference can be removed by ordering test number [id number]-"Immunofixation, Daratumumab-Specific, Serum" or 123062-"Immunofixation, Isatuximab-Specific, Serum" and submitting a new sample for testing or by calling the lab to add this test to the current sample.   Kappa/lambda light chains     Status: Abnormal   Collection Time: 07/09/20 12:16  PM  Result Value Ref Range   Kappa free light chain 75.5 (H) 3.3 - 19.4 mg/L   Lamda free light chains 18.1 5.7 - 26.3 mg/L   Kappa, lamda light chain ratio 4.17 (H) 0.26 - 1.65    Comment: (NOTE) Performed At: [facility] [place] [address] [place], [place] [id number] Rush Farmer MD [id number]   Copper, serum     Status: Abnormal   Collection Time: 07/16/20 11:19 AM  Result Value Ref Range   Copper 166 (H) 80 - 158 ug/dL     Comment: (NOTE) This test was developed and its performance characteristics determined by [facility]. It has not been cleared or approved by the Food and Drug Administration.                                Detection Limit = 5 Performed At: [facility] [place], [place] [id number] Rush Farmer MD [id number]   Beta 2 microglobulin, serum     Status: Abnormal   Collection Time: 07/16/20 11:19 AM  Result Value Ref Range   Beta-2 Microglobulin 2.8 (H) 0.6 - 2.4 mg/L    Comment: (NOTE) Siemens Immulite 2000 Immunochemiluminometric assay (ICMA) Values obtained with different assay methods or kits cannot be used interchangeably. Results cannot be interpreted as absolute evidence of the presence or absence of malignant disease. Performed At: [facility] [place], [place] [id number] Rush Farmer MD [id number]   Sample to Blood Bank(Blood Bank Hold)     Status: None   Collection Time: 07/16/20 11:19 AM  Result Value Ref Range   Blood Bank Specimen SAMPLE AVAILABLE FOR TESTING    Sample Expiration      07/19/2020,2359 Performed at [facility], [address], [place], [place] [address]   CBC with Differential     Status: Abnormal   Collection Time: 07/16/20 11:19 AM  Result Value Ref Range   WBC 4.1 4.0 - 10.5 K/uL   RBC 2.61 (L) 3.87 - 5.11 MIL/uL   Hemoglobin 6.4 (LL) 12.0 - 15.0 g/dL    Comment: This critical result has verified and been called to Anastasio Champion by Charlie Pitter on 04 21 2022 at 1154, and has been read back.    HCT 23.7 (L) 36.0 - 46.0 %   MCV 90.8 80.0 - 100.0 fL   MCH 24.5 (L) 26.0 - 34.0 pg   MCHC 27.0 (L) 30.0 - 36.0 g/dL   RDW 17.5 (H) 11.5 - 15.5 %   Platelets 104 (L) 150 - 400 K/uL    Comment: SPECIMEN CHECKED FOR CLOTS Immature Platelet Fraction may be clinically indicated, consider ordering this additional test [id number]    nRBC 0.0 0.0 - 0.2 %   Neutrophils Relative % 59 %   Neutro Abs 2.4 1.7 -  7.7 K/uL   Lymphocytes Relative 30 %   Lymphs Abs 1.2 0.7 - 4.0 K/uL   Monocytes Relative 10 %   Monocytes Absolute 0.4 0.1 - 1.0 K/uL   Eosinophils Relative 1 %   Eosinophils Absolute 0.0 0.0 - 0.5 K/uL   Basophils Relative 0 %   Basophils Absolute 0.0 0.0 - 0.1 K/uL   Immature Granulocytes 0 %   Abs Immature Granulocytes 0.01 0.00 - 0.07 K/uL    Comment: Performed at [facility], [address], [place], [place] [address]  Type and screen     Status: None   Collection Time:  07/16/20 11:19 AM  Result Value Ref Range   ABO/RH(D) A POS    Antibody Screen NEG    Sample Expiration 07/19/2020,2359    Unit Number [id number]    Blood Component Type RED CELLS,LR    Unit division 00    Status of Unit ISSUED,FINAL    Transfusion Status OK TO TRANSFUSE    Crossmatch Result      Compatible Performed at [facility], [address] [place], [place] [address]   Prepare RBC (crossmatch)     Status: None   Collection Time: 07/16/20 11:19 AM  Result Value Ref Range   Order Confirmation      ORDER PROCESSED BY BLOOD BANK Performed at [facility], [address] [place], [place] [address]   BPAM RBC     Status: None   Collection Time: 07/16/20 11:19 AM  Result Value Ref Range   ISSUE DATE / TIME [id number]    Blood Product Unit Number [id number]    PRODUCT CODE [id number]    Unit Type and Rh 6200    Blood Product Expiration Date 973532992426   Sample to Blood Bank(Blood Bank Hold)     Status: None   Collection Time: 07/23/20  8:29 AM  Result Value Ref Range   Blood Bank Specimen SAMPLE AVAILABLE FOR TESTING    Sample Expiration      07/26/2020,2359 Performed at [facility], [address] [place], [place] [address]   CBC with Differential     Status: Abnormal   Collection Time: 07/23/20  8:29 AM  Result Value Ref Range   WBC 6.4 4.0 - 10.5 K/uL   RBC 2.81 (L) 3.87 - 5.11 MIL/uL   Hemoglobin 7.2 (L) 12.0 - 15.0 g/dL   HCT 26.4 (L) 36.0 - 46.0 %   MCV 94.0 80.0 - 100.0 fL    MCH 25.6 (L) 26.0 - 34.0 pg   MCHC 27.3 (L) 30.0 - 36.0 g/dL   RDW 22.5 (H) 11.5 - 15.5 %   Platelets 114 (L) 150 - 400 K/uL    Comment: SPECIMEN CHECKED FOR CLOTS Immature Platelet Fraction may be clinically indicated, consider ordering this additional test [id number] PLATELET COUNT CONFIRMED BY SMEAR    nRBC 0.0 0.0 - 0.2 %   Neutrophils Relative % 61 %   Neutro Abs 3.9 1.7 - 7.7 K/uL   Lymphocytes Relative 27 %   Lymphs Abs 1.7 0.7 - 4.0 K/uL   Monocytes Relative 10 %   Monocytes Absolute 0.6 0.1 - 1.0 K/uL   Eosinophils Relative 1 %   Eosinophils Absolute 0.1 0.0 - 0.5 K/uL   Basophils Relative 0 %   Basophils Absolute 0.0 0.0 - 0.1 K/uL   Immature Granulocytes 1 %   Abs Immature Granulocytes 0.04 0.00 - 0.07 K/uL   Polychromasia PRESENT     Comment: Performed at [facility], [address] [place], [place] [address]    RADIOGRAPHIC STUDIES: I have personally reviewed the radiological images as listed and agreed with the findings in the report. DG Bone Survey Met  Result Date: 07/17/2020 CLINICAL DATA:  Monoclonal gammopathy EXAM: METASTATIC BONE SURVEY COMPARISON:  Multiple prior imaging studies. FINDINGS: Lateral skull: No concerning lytic lesion.  Vascular channels. Right shoulder: Degenerative changes.  No lytic lesions. Left shoulder: Degenerative changes.  No lytic lesions. Left humerus: No lytic lesions. Right humerus: No lytic lesions. Right forearm: No lytic lesions. Left forearm: No lytic lesions. Cervical spine two views: No lytic lesions or fracture. Thoracic spine  two views: No lytic lesions or fractures. Lumbar spine two views: No lytic lesions or fractures. Previous fusion surgery L4-5. Gallstones noted in the right upper quadrant. Chest: No rib lesion seen. Multiple pulmonary calcifications as seen chronically. AP pelvis: No lytic lesion. Left femur: No lytic lesion. Right femur: No lytic lesion. Right tib fib: No lytic lesion. Left tib fib: No lytic lesion.  IMPRESSION: No lytic lesions. Chronic degenerative changes of the shoulders and spine. Previous L4-5 fusion. Gallstones. Innumerable calcified pulmonary micro nodules. Electronically Signed   By: Nelson Chimes M.D.   On: 07/17/2020 14:40    ASSESSMENT: 1.  Iron deficiency anemia in the setting of GI blood loss -She has had three hospital admissions for GI bleeding in the past 6 months (October 2021, December 2021, February 2022) -Hgb was as low as 3.7 (01/22/2020), and she has had 8 blood transfusions. -Before the GI bleeding started in October 2021, her hemoglobin was within normal range. -Extensive endoscopic work-up, but source of chronic anemia has yet to be identified.  Last EGD (05/18/2020) showed grade 1 esophageal varices.  Last colonoscopy (January 2022) showed nonbleeding internal hemorrhoids, diverticulosis in the sigmoid and descending colon, and polypectomy. -She is scheduled for repeat EGD on 07/28/2020, continues to follow with Dr. Abbey Chatters -Patient was started on oral ferrous sulfate by her PCP, but with little improvement - noted have Hgb 7.7 on 06/24/2020 -Most recent labs from PCP (06/24/2020) show Hgb 7.8, MCV 84 -Suspect poor absorption of iron and is also on pantoprazole daily -She is currently symptomatic with severe fatigue and dyspnea on exertion  2.  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  Iron deficiency anemia in the setting of GI blood loss -Labs today, including repeat CBC, CMP, iron panel and ferritin, B12, folate, copper, methylmalonic acid -We will check LDH, reticulocytes, haptoglobin, and SPEP/IFE/light chains -We will go ahead and schedule the patient for IV iron (Venofer) infusions, having high suspicion that the patient will show depleted iron stores (if  labs today show normal iron, we will cancel IV iron infusions) -Patient can continue oral iron after IV iron infusions to try to maintain adequate iron stores after initial repletion -Due to severity of anemia and concern for possible ongoing blood loss, we will recheck CBC in 4 weeks with repeat office visit -We will repeat CBC and iron panel in 2 months, with follow-up office visit -Patient knows to go to the emergency department if she has another episode of melena or associated symptoms of blood loss  2.  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  Congestive heart failure -Patient reports that she has diagnosis of congestive heart failure -Noted on exam to have bibasilar crackles in her lungs as well as increased peripheral edema -Patient reports that she did not take her Lasix yesterday or today -Educated patient on the importance of compliance with her medication -She reports that she will take her Lasix tonight and tomorrow - she is informed to call her cardiologist if her edema does not go down, and to present to the emergency department if she has extreme difficulty breathing, chest pain, or other concerning signs or symptoms   PLAN SUMMARY & DISPOSITION: -Schedule for IV Venofer -Labs  today -CBC and office visit in 4 weeks -Repeat CBC and iron panel in 2 months -RTC in 2 months for office follow-up  All questions were answered. The patient knows to call the clinic with any problems, questions or concerns.   Medical decision making: Moderate (1 new problem under work-up, review of external notes, review of prior lab results, ordering of new tests)  Time spent on visit: I spent 30 minutes counseling the patient face to face. The total time spent in the appointment was 40 minutes and more than 50% was on counseling.  I, Tarri Abernethy PA-C, have  seen this patient in conjunction with Dr. Derek Jack. Greater than 50% of visit was performed by Dr. Delton Coombes.  Addendum: I have independently evaluated this patient and agree with HPI written by Casey Burkitt, PA-C.  She was sent for evaluation of normocytic anemia and mild thrombocytopenia.  She does not have any history of CKD.  We will work-up for nutritional deficiencies and schedule her for parenteral iron therapy.  We discussed side effects in detail.  We will also do work-up for plasma cell disorders.  She will come back to discuss results.    Derek Jack, MD 07/25/20 12:52 PM

## 2020-07-09 ENCOUNTER — Other Ambulatory Visit: Payer: Self-pay

## 2020-07-09 ENCOUNTER — Inpatient Hospital Stay (HOSPITAL_COMMUNITY): Payer: Medicare PPO

## 2020-07-09 ENCOUNTER — Inpatient Hospital Stay (HOSPITAL_COMMUNITY): Payer: Medicare PPO | Attending: Hematology | Admitting: Hematology

## 2020-07-09 VITALS — BP 119/52 | HR 86 | Temp 98.3°F | Resp 17

## 2020-07-09 DIAGNOSIS — K7460 Unspecified cirrhosis of liver: Secondary | ICD-10-CM | POA: Diagnosis not present

## 2020-07-09 DIAGNOSIS — D472 Monoclonal gammopathy: Secondary | ICD-10-CM | POA: Diagnosis not present

## 2020-07-09 DIAGNOSIS — D3502 Benign neoplasm of left adrenal gland: Secondary | ICD-10-CM | POA: Insufficient documentation

## 2020-07-09 DIAGNOSIS — D509 Iron deficiency anemia, unspecified: Secondary | ICD-10-CM | POA: Insufficient documentation

## 2020-07-09 DIAGNOSIS — Z87891 Personal history of nicotine dependence: Secondary | ICD-10-CM | POA: Insufficient documentation

## 2020-07-09 DIAGNOSIS — D696 Thrombocytopenia, unspecified: Secondary | ICD-10-CM | POA: Diagnosis not present

## 2020-07-09 DIAGNOSIS — E119 Type 2 diabetes mellitus without complications: Secondary | ICD-10-CM | POA: Diagnosis not present

## 2020-07-09 DIAGNOSIS — K7581 Nonalcoholic steatohepatitis (NASH): Secondary | ICD-10-CM | POA: Diagnosis not present

## 2020-07-09 DIAGNOSIS — D500 Iron deficiency anemia secondary to blood loss (chronic): Secondary | ICD-10-CM | POA: Diagnosis not present

## 2020-07-09 DIAGNOSIS — K922 Gastrointestinal hemorrhage, unspecified: Secondary | ICD-10-CM | POA: Diagnosis not present

## 2020-07-09 DIAGNOSIS — I509 Heart failure, unspecified: Secondary | ICD-10-CM | POA: Diagnosis not present

## 2020-07-09 DIAGNOSIS — E559 Vitamin D deficiency, unspecified: Secondary | ICD-10-CM | POA: Insufficient documentation

## 2020-07-09 DIAGNOSIS — I10 Essential (primary) hypertension: Secondary | ICD-10-CM | POA: Diagnosis not present

## 2020-07-09 LAB — COMPREHENSIVE METABOLIC PANEL
ALT: 16 U/L (ref 0–44)
AST: 24 U/L (ref 15–41)
Albumin: 3 g/dL — ABNORMAL LOW (ref 3.5–5.0)
Alkaline Phosphatase: 91 U/L (ref 38–126)
Anion gap: 10 (ref 5–15)
BUN: 17 mg/dL (ref 8–23)
CO2: 26 mmol/L (ref 22–32)
Calcium: 8.5 mg/dL — ABNORMAL LOW (ref 8.9–10.3)
Chloride: 101 mmol/L (ref 98–111)
Creatinine, Ser: 0.63 mg/dL (ref 0.44–1.00)
GFR, Estimated: >60 mL/min (ref >60)
Glucose, Bld: 100 mg/dL — ABNORMAL HIGH (ref 70–99)
Potassium: 3.6 mmol/L (ref 3.5–5.1)
Sodium: 137 mmol/L (ref 135–145)
Total Bilirubin: 0.3 mg/dL (ref 0.3–1.2)
Total Protein: 8.6 g/dL — ABNORMAL HIGH (ref 6.5–8.1)

## 2020-07-09 LAB — VITAMIN B12: Vitamin B-12: 227 pg/mL (ref 180–914)

## 2020-07-09 LAB — CBC WITH DIFFERENTIAL/PLATELET
Abs Immature Granulocytes: 0.03 10*3/uL (ref 0.00–0.07)
Basophils Absolute: 0 10*3/uL (ref 0.0–0.1)
Basophils Relative: 0 %
Eosinophils Absolute: 0 10*3/uL (ref 0.0–0.5)
Eosinophils Relative: 1 %
HCT: 25.6 % — ABNORMAL LOW (ref 36.0–46.0)
Hemoglobin: 7.1 g/dL — ABNORMAL LOW (ref 12.0–15.0)
Immature Granulocytes: 1 %
Lymphocytes Relative: 33 %
Lymphs Abs: 2 10*3/uL (ref 0.7–4.0)
MCH: 25.4 pg — ABNORMAL LOW (ref 26.0–34.0)
MCHC: 27.7 g/dL — ABNORMAL LOW (ref 30.0–36.0)
MCV: 91.4 fL (ref 80.0–100.0)
Monocytes Absolute: 0.5 10*3/uL (ref 0.1–1.0)
Monocytes Relative: 9 %
Neutro Abs: 3.4 10*3/uL (ref 1.7–7.7)
Neutrophils Relative %: 56 %
Platelets: 138 10*3/uL — ABNORMAL LOW (ref 150–400)
RBC: 2.8 MIL/uL — ABNORMAL LOW (ref 3.87–5.11)
RDW: 18.6 % — ABNORMAL HIGH (ref 11.5–15.5)
WBC: 5.9 10*3/uL (ref 4.0–10.5)
nRBC: 0 % (ref 0.0–0.2)

## 2020-07-09 LAB — IRON AND TIBC
Iron: 16 ug/dL — ABNORMAL LOW (ref 28–170)
Saturation Ratios: 3 % — ABNORMAL LOW (ref 10.4–31.8)
TIBC: 507 ug/dL — ABNORMAL HIGH (ref 250–450)
UIBC: 491 ug/dL

## 2020-07-09 LAB — VITAMIN D 25 HYDROXY (VIT D DEFICIENCY, FRACTURES): Vit D, 25-Hydroxy: 36.63 ng/mL (ref 30–100)

## 2020-07-09 LAB — RETICULOCYTES
Immature Retic Fract: 31.7 % — ABNORMAL HIGH (ref 2.3–15.9)
RBC.: 2.79 MIL/uL — ABNORMAL LOW (ref 3.87–5.11)
Retic Count, Absolute: 96 10*3/uL (ref 19.0–186.0)
Retic Ct Pct: 3.4 % — ABNORMAL HIGH (ref 0.4–3.1)

## 2020-07-09 LAB — LACTATE DEHYDROGENASE: LDH: 106 U/L (ref 98–192)

## 2020-07-09 LAB — FERRITIN: Ferritin: 13 ng/mL (ref 11–307)

## 2020-07-09 LAB — FOLATE: Folate: 6.9 ng/mL (ref >5.9)

## 2020-07-09 NOTE — Patient Instructions (Signed)
[place] at [facility] Discharge Instructions  You were seen today by Dr. Delton Coombes and Tarri Abernethy PA-C for your iron deficiency anemia related to blood loss.  We will check for other causes of anemia as well, but suspect that this is all related to your GI bleeding - continue to follow with Dr. Abbey Chatters.    LABS:  - Labs today - Repeat labs in 4 weeks  - Repeat labs in 8 weeks  OTHER TESTS: None  MEDICATIONS: No changes  FOLLOW-UP APPOINTMENT:  - Office visit in 4 weeks - Office visit in 8 weeks   Thank you for choosing [place] at [facility] to provide your oncology and hematology care.  To afford each patient quality time with our provider, please arrive at least 15 minutes before your scheduled appointment time.   If you have a lab appointment with the [facility] please come in thru the Main Entrance and check in at the main information desk.  You need to re-schedule your appointment should you arrive 10 or more minutes late.  We strive to give you quality time with our providers, and arriving late affects you and other patients whose appointments are after yours.  Also, if you no show three or more times for appointments you may be dismissed from the clinic at the providers discretion.     Again, thank you for choosing [facility].  Our hope is that these requests will decrease the amount of time that you wait before being seen by our physicians.       _____________________________________________________________  Should you have questions after your visit to [facility], please contact our office at [phone] and follow the prompts.  Our office hours are 8:00 a.m. and 4:30 p.m. Monday - Friday.  Please note that voicemails left after 4:00 p.m. may not be returned until the following business day.  We are closed weekends and major holidays.  You do have access to a nurse 24-7, just call the  main number to the clinic [phone] and do not press any options, hold on the line and a nurse will answer the phone.    For prescription refill requests, have your pharmacy contact our office and allow 72 hours.    Due to Covid, you will need to wear a mask upon entering the hospital. If you do not have a mask, a mask will be given to you at the Main Entrance upon arrival. For doctor visits, patients may have 1 support person age 7 or older with them. For treatment visits, patients can not have anyone with them due to social distancing guidelines and our immunocompromised population.

## 2020-07-10 LAB — PROTEIN ELECTROPHORESIS, SERUM
A/G Ratio: 0.6 — ABNORMAL LOW (ref 0.7–1.7)
Albumin ELP: 3 g/dL (ref 2.9–4.4)
Alpha-1-Globulin: 0.3 g/dL (ref 0.0–0.4)
Alpha-2-Globulin: 0.7 g/dL (ref 0.4–1.0)
Beta Globulin: 1.1 g/dL (ref 0.7–1.3)
Gamma Globulin: 3 g/dL — ABNORMAL HIGH (ref 0.4–1.8)
Globulin, Total: 5.1 g/dL — ABNORMAL HIGH (ref 2.2–3.9)
M-Spike, %: 2.6 g/dL — ABNORMAL HIGH
Total Protein ELP: 8.1 g/dL (ref 6.0–8.5)

## 2020-07-10 LAB — KAPPA/LAMBDA LIGHT CHAINS
Kappa free light chain: 75.5 mg/L — ABNORMAL HIGH (ref 3.3–19.4)
Kappa, lambda light chain ratio: 4.17 — ABNORMAL HIGH (ref 0.26–1.65)
Lambda free light chains: 18.1 mg/L (ref 5.7–26.3)

## 2020-07-10 LAB — HAPTOGLOBIN: Haptoglobin: 170 mg/dL (ref 37–355)

## 2020-07-13 LAB — IMMUNOFIXATION ELECTROPHORESIS
IgA: 204 mg/dL (ref 87–352)
IgG (Immunoglobin G), Serum: 3727 mg/dL — ABNORMAL HIGH (ref 586–1602)
IgM (Immunoglobulin M), Srm: 32 mg/dL (ref 26–217)
Total Protein ELP: 8.1 g/dL (ref 6.0–8.5)

## 2020-07-14 ENCOUNTER — Telehealth: Payer: Self-pay | Admitting: *Deleted

## 2020-07-14 ENCOUNTER — Telehealth: Payer: Medicare PPO | Admitting: *Deleted

## 2020-07-14 NOTE — Telephone Encounter (Signed)
  Chronic Care Management   Outreach Note  07/14/2020 Name: Savannah Burns MRN: [id number] DOB: 02-Oct-1950  Referred by: [name], DO Reason for referral : Chronic Care Management (RN follow up)   An unsuccessful telephone outreach was attempted today. The patient was referred to the case management team for assistance with care management and care coordination.    Chart reviewed prior to telephone call. Patient was seen by hematologist on 07/09/20 and had extensive labwork drawn. Lab result interpretations are not available for review as of now. Patient is scheduled for an iron infusion tomorrow. According to the office note, they would cancel if iron levels were normal. I have scheduled a telephone follow-up call for 07/18/19.  Follow Up Plan: Telephone follow up appointment with care management team member scheduled for: 07/18/19 A HIPAA compliant phone message was left for the patient providing contact information and requesting a return call.   [name], BSN, RN-BC Embedded Chronic Care Manager [facility] Direct Dial: [phone]

## 2020-07-15 ENCOUNTER — Other Ambulatory Visit (HOSPITAL_COMMUNITY): Payer: Self-pay | Admitting: Physician Assistant

## 2020-07-15 DIAGNOSIS — D500 Iron deficiency anemia secondary to blood loss (chronic): Secondary | ICD-10-CM

## 2020-07-15 DIAGNOSIS — D472 Monoclonal gammopathy: Secondary | ICD-10-CM

## 2020-07-15 NOTE — Progress Notes (Signed)
Patient had extensive lab work-up due to anemia with suspected GI bleed.  Iron panel showed iron saturation 3% with ferritin 13.  She has already been scheduled for IV iron repletion.  Vitamin B12 normal (227), folate normal (6.9), methylmalonic acid pending.  Her hemoglobin had dropped to 7.1, therefore repeat CBC has been ordered for tomorrow (07/16/2020) with sample sent to blood bank in case patient will need RBC transfusion.  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  LDH was normal at 106.  Further testing ordered to be done tomorrow (07/16/2020) including 24-hour urine with UPEP/IFE, copper, beta-2 microglobulin, and skeletal survey.  Due to abnormal lab results as above, patient has also been scheduled for urgent office follow-up on 07/17/2020.

## 2020-07-16 ENCOUNTER — Inpatient Hospital Stay (HOSPITAL_COMMUNITY): Payer: Medicare PPO

## 2020-07-16 ENCOUNTER — Ambulatory Visit (HOSPITAL_COMMUNITY): Payer: Medicare PPO

## 2020-07-16 ENCOUNTER — Encounter (HOSPITAL_COMMUNITY): Payer: Self-pay

## 2020-07-16 ENCOUNTER — Other Ambulatory Visit: Payer: Self-pay

## 2020-07-16 ENCOUNTER — Other Ambulatory Visit (HOSPITAL_COMMUNITY): Payer: Medicare PPO

## 2020-07-16 ENCOUNTER — Other Ambulatory Visit (HOSPITAL_COMMUNITY): Payer: Self-pay | Admitting: Physician Assistant

## 2020-07-16 ENCOUNTER — Ambulatory Visit (HOSPITAL_COMMUNITY)
Admission: RE | Admit: 2020-07-16 | Discharge: 2020-07-16 | Disposition: A | Discharge status: Home / Self Care | Payer: Medicare PPO | Source: Ambulatory Visit | Attending: Physician Assistant | Admitting: Physician Assistant

## 2020-07-16 VITALS — BP 100/42 | HR 85 | Temp 97.4°F | Resp 19

## 2020-07-16 DIAGNOSIS — D500 Iron deficiency anemia secondary to blood loss (chronic): Secondary | ICD-10-CM

## 2020-07-16 DIAGNOSIS — K7581 Nonalcoholic steatohepatitis (NASH): Secondary | ICD-10-CM | POA: Diagnosis not present

## 2020-07-16 DIAGNOSIS — D3502 Benign neoplasm of left adrenal gland: Secondary | ICD-10-CM | POA: Diagnosis not present

## 2020-07-16 DIAGNOSIS — K922 Gastrointestinal hemorrhage, unspecified: Secondary | ICD-10-CM | POA: Diagnosis not present

## 2020-07-16 DIAGNOSIS — D472 Monoclonal gammopathy: Secondary | ICD-10-CM

## 2020-07-16 DIAGNOSIS — D509 Iron deficiency anemia, unspecified: Secondary | ICD-10-CM | POA: Diagnosis not present

## 2020-07-16 DIAGNOSIS — I509 Heart failure, unspecified: Secondary | ICD-10-CM | POA: Diagnosis not present

## 2020-07-16 DIAGNOSIS — D649 Anemia, unspecified: Secondary | ICD-10-CM

## 2020-07-16 DIAGNOSIS — D696 Thrombocytopenia, unspecified: Secondary | ICD-10-CM | POA: Diagnosis not present

## 2020-07-16 DIAGNOSIS — K7460 Unspecified cirrhosis of liver: Secondary | ICD-10-CM | POA: Diagnosis not present

## 2020-07-16 DIAGNOSIS — E119 Type 2 diabetes mellitus without complications: Secondary | ICD-10-CM | POA: Diagnosis not present

## 2020-07-16 LAB — CBC WITH DIFFERENTIAL/PLATELET
Abs Immature Granulocytes: 0.01 10*3/uL (ref 0.00–0.07)
Basophils Absolute: 0 10*3/uL (ref 0.0–0.1)
Basophils Relative: 0 %
Eosinophils Absolute: 0 10*3/uL (ref 0.0–0.5)
Eosinophils Relative: 1 %
HCT: 23.7 % — ABNORMAL LOW (ref 36.0–46.0)
Hemoglobin: 6.4 g/dL — CL (ref 12.0–15.0)
Immature Granulocytes: 0 %
Lymphocytes Relative: 30 %
Lymphs Abs: 1.2 10*3/uL (ref 0.7–4.0)
MCH: 24.5 pg — ABNORMAL LOW (ref 26.0–34.0)
MCHC: 27 g/dL — ABNORMAL LOW (ref 30.0–36.0)
MCV: 90.8 fL (ref 80.0–100.0)
Monocytes Absolute: 0.4 10*3/uL (ref 0.1–1.0)
Monocytes Relative: 10 %
Neutro Abs: 2.4 10*3/uL (ref 1.7–7.7)
Neutrophils Relative %: 59 %
Platelets: 104 10*3/uL — ABNORMAL LOW (ref 150–400)
RBC: 2.61 MIL/uL — ABNORMAL LOW (ref 3.87–5.11)
RDW: 17.5 % — ABNORMAL HIGH (ref 11.5–15.5)
WBC: 4.1 10*3/uL (ref 4.0–10.5)
nRBC: 0 % (ref 0.0–0.2)

## 2020-07-16 LAB — SAMPLE TO BLOOD BANK

## 2020-07-16 LAB — PREPARE RBC (CROSSMATCH)

## 2020-07-16 LAB — METHYLMALONIC ACID, SERUM: Methylmalonic Acid, Quantitative: 439 nmol/L — ABNORMAL HIGH (ref 0–378)

## 2020-07-16 MED ORDER — SODIUM CHLORIDE 0.9 % IV SOLN
Freq: Once | INTRAVENOUS | Status: AC
Start: 2020-07-16 — End: 2020-07-16

## 2020-07-16 MED ORDER — ACETAMINOPHEN 325 MG PO TABS
650.0000 mg | ORAL_TABLET | Freq: Once | ORAL | Status: AC
  Administered 2020-07-16: 650 mg via ORAL

## 2020-07-16 MED ORDER — ACETAMINOPHEN 325 MG PO TABS
ORAL_TABLET | ORAL | Status: AC
  Filled 2020-07-16: qty 2

## 2020-07-16 MED ORDER — SODIUM CHLORIDE 0.9 % IV SOLN
300.0000 mg | Freq: Once | INTRAVENOUS | Status: AC
  Administered 2020-07-16: 300 mg via INTRAVENOUS
  Filled 2020-07-16: qty 15

## 2020-07-16 NOTE — Progress Notes (Signed)
Venofer given today per MD orders. Tolerated infusion without adverse affects. Vital signs stable. No complaints at this time. Discharged from clinic via wheel chair in stable condition. Alert and oriented x 3. F/U with [facility] as scheduled.

## 2020-07-16 NOTE — Progress Notes (Signed)
Savannah Burns, Savannah Burns   CLINIC:  Medical Oncology/Hematology  PCP:  [name], DO [place] [address] [phone]   REASON FOR VISIT:  Follow-up for anemia and MGUS  CURRENT THERAPY: RBC transfusion, IV iron  INTERVAL HISTORY:  Savannah Burns 70 y.o. female returns for urgent follow-up of her iron deficiency anemia as well as to discuss new findings concerning for plasma cell dyscrasia (MGUS, SMM, or multiple myeloma).  SUMMARY: She was seen for initial consultation by Tarri Abernethy PA-C and Dr. Delton Coombes on 07/09/2020 due to iron deficiency anemia related to GI bleeding.  She has had multiple ED visits and hospital admissions over the past 6 months for melena and associated drops in her hemoglobin, at one point as low as Hgb 3.7.  EGD showed grade 1 esophageal varices and colonoscopies showed nonbleeding internal hemorrhoids.  However, definitive source of bleeding has not yet been found, and patient is scheduled for small bowel endoscopy with GI on 07/28/2020.  ANEMIA: At her last visit, anemia work-up was obtained, which showed severe iron deficiency (iron 13, iron saturation 3%) as well as worsening anemia (Hgb 7.1) on 07/09/2020.  Repeat CBC was therefore obtained on 07/16/2020, which showed that Hgb had dropped to 6.4.  Patient presents today to receive blood transfusion.  She has already received 1 dose of IV Venofer and is scheduled for her second and third doses next week.  She denies any further bleeding episodes since our last visit  - no black/tarry stools, melena, hematochezia, hematemesis, hemoptysis, hematuria, or epistaxis.  However, as her hemoglobin has continued to drop, she reports worsening fatigue.  She reports her energy level is at 0% with progressive fatigue.  She reports that she is sleeping most of the day, and is barely able to get up and move about.  She has persistent shortness of breath with  exertion, but denies chest pain, presyncopal episodes, or palpitations.  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  LDH normal at 106. She denies any new bone pain or recent fractures. She denies any B symptoms.   No new neurologic symptoms such as new-onset paresthesias, hearing loss, blurred vision, headache, or dizziness.   No history of thromboembolic events No new masses or lymphadenopathy per her report.  She has 0% energy and 100% appetite. She endorses that she is maintaining a stable weight.  B12 DEFICIENCY: Borderline Vitamin B12 deficiency evidenced by B12 227 and methylmalonic acid 439 (elevated).  Folate normal at 6.9.    REVIEW OF SYSTEMS:  Review of Systems  Constitutional: Positive for fatigue. Negative for appetite change, chills, diaphoresis, fever and unexpected weight change.  HENT:   Negative for lump/mass and nosebleeds.   Eyes: Negative for eye problems.  Respiratory: Positive for shortness of breath (with exertion). Negative for cough and hemoptysis.   Cardiovascular: Positive for leg swelling (missed 2 doses of Lasix). Negative for chest pain and palpitations.  Gastrointestinal: Negative for abdominal pain, blood in stool, constipation, diarrhea, nausea and vomiting.  Genitourinary: Negative for hematuria.   Skin: Negative.   Neurological: Negative for dizziness, headaches and light-headedness.  Hematological: Does not bruise/bleed easily.      PAST MEDICAL/SURGICAL HISTORY:  Past Medical History:  Diagnosis Date  . Adrenal  adenoma, left    Stable  . Anxiety   . Arthritis    bilateral hands  . Depression   . Diabetes mellitus, type 2 ([place]) 08/12/2008   Qualifier: Diagnosis of  By: Deborra Medina MD, Tanja Port    . Dyspnea   . Esophageal varices  ([place])   . Grade II diastolic dysfunction   . History of kidney stones   . Hyperlipidemia   . Hypertension   . Lower back pain   . Lower GI bleed 03/19/2020  . Panic attacks   . Pneumonia    currently taking antibiotic and prednisone for early stages of pneumonia  . Pulmonary nodules    bilateral  . Skin cancer    face   Past Surgical History:  Procedure Laterality Date  . BIOPSY  04/07/2020   Procedure: BIOPSY;  Surgeon: Eloise Harman, DO;  [facility];  Service: Endoscopy;;  . Breast Cystectomy  Right   . CESAREAN SECTION    . COLONOSCOPY WITH PROPOFOL N/A 01/25/2020   Dr. Abbey Chatters: Nonbleeding internal hemorrhoids, diverticulosis, 5 mm polyp removed from the ascending colon, 10 mm polyp removed from the sigmoid colon, 30 mm polyp (tubulovillous adenoma with no high-grade dysplasia) removed from the transverse colon via piecemeal status post tattoo.  Other polyps were tubular adenomas.  3 month surveillance colonoscopy recommended.  . COLONOSCOPY WITH PROPOFOL N/A 04/07/2020   Procedure: COLONOSCOPY WITH PROPOFOL;  Surgeon: Eloise Harman, DO;  Location: [facility];  Service: Endoscopy;  Laterality: N/A;  3:00pm, pt knows new time per office  . CYSTOSCOPY/URETEROSCOPY/HOLMIUM LASER/STENT PLACEMENT Bilateral 03/01/2019   Procedure: CYSTOSCOPY/RETROGRADEURETEROSCOPY/HOLMIUM LASER/STENT PLACEMENT;  Surgeon: Ceasar Mons, MD;  Location: [facility];  Service: Urology;  Laterality: Bilateral;  ONLY NEEDS 60 MIN  . ESOPHAGOGASTRODUODENOSCOPY (EGD) WITH PROPOFOL N/A 01/25/2020   Dr. Abbey Chatters: 4 columns grade 1 esophageal varices  . ESOPHAGOGASTRODUODENOSCOPY (EGD) WITH PROPOFOL N/A 05/18/2020   Procedure: ESOPHAGOGASTRODUODENOSCOPY (EGD) WITH PROPOFOL;  Surgeon: Eloise Harman, DO;  [facility];  Service: Endoscopy;  Laterality: N/A;  . GIVENS CAPSULE STUDY N/A 05/18/2020   Procedure: GIVENS CAPSULE STUDY;  Surgeon: Harvel Quale, MD;   [facility];  Service: Gastroenterology;  Laterality: N/A;  . POLYPECTOMY  01/25/2020   Procedure: POLYPECTOMY;  Surgeon: Eloise Harman, DO;  [facility];  Service: Endoscopy;;  . POLYPECTOMY  04/07/2020   Procedure: POLYPECTOMY INTESTINAL;  Surgeon: Eloise Harman, DO;  [facility];  Service: Endoscopy;;  . SKIN CANCER EXCISION     Face  . SPINE SURGERY    . SUBMUCOSAL TATTOO INJECTION  01/25/2020   Procedure: SUBMUCOSAL TATTOO INJECTION;  Surgeon: Eloise Harman, DO;  [facility];  Service: Endoscopy;;     SOCIAL HISTORY:  Social History   Socioeconomic History  . Marital status: Widowed    Spouse name: Not on file  . Number of children: 2  . Years of education: 68  . Highest education level: Not on file  Occupational History  . Occupation: Retired   Tobacco Use  . Smoking status: Former Smoker    Packs/day: 1.50    Years: 40.00    Pack years: 60.00    Types: Cigarettes    Quit date: 04/29/2015    Years since quitting: 5.2  . Smokeless tobacco: Never Used  . Tobacco comment: Quit smoking 04/2015- Previous 1.5 ppd smoker  Vaping Use  . Vaping Use: Never used  Substance and  Sexual Activity  . Alcohol use: No    Alcohol/week: 0.0 standard drinks  . Drug use: No  . Sexual activity: Not Currently    Birth control/protection: Post-menopausal  Other Topics Concern  . Not on file  Social History Narrative  . Not on file   Social Determinants of Health   Financial Resource Strain: Low Risk   . Difficulty of Paying Living Expenses: Not very hard  Food Insecurity: Not on file  Transportation Needs: No Transportation Needs  . Lack of Transportation (Medical): No  . Lack of Transportation (Non-Medical): No  Physical Activity: Inactive  . Days of Exercise per Week: 0 days  . Minutes of Exercise per Session: 0 min  Stress: Not on file  Social Connections: Not on file  Intimate Partner Violence: Not on file     FAMILY HISTORY:  Family History  Problem Relation Age of Onset  . Diabetes Father   . Heart disease Father 101       MI  . Hypertension Father   . Anemia Mother        Transfusion dependent  . COPD Sister   . Cancer Paternal Grandmother 26       Pancreatic    CURRENT MEDICATIONS:  Outpatient Encounter Medications as of 07/17/2020  Medication Sig  . Blood Glucose Calibration (GLUCOMETER DEX HIGH CONTROL) LIQD Test CBG's once daily.   . Blood Glucose Monitoring Suppl (TRUE METRIX AIR GLUCOSE METER) w/Device KIT Check BS daily Dx E11.9  . Cholecalciferol (VITAMIN D) 50 MCG (2000 UT) tablet Take 4,000 Units by mouth daily.  . ferrous sulfate 325 (65 FE) MG tablet Take 1 tablet (325 mg total) by mouth 2 (two) times daily with a meal.  . glucose blood (TRUE METRIX BLOOD GLUCOSE TEST) test strip Check BS daily Dx E11.9  . lisinopril (ZESTRIL) 10 MG tablet Take 1 tablet by mouth once daily (Patient taking differently: Take 10 mg by mouth daily.)  . lovastatin (MEVACOR) 20 MG tablet Take 1 tablet (20 mg total) by mouth at bedtime.  . metFORMIN (GLUCOPHAGE) 1000 MG tablet Take 1 tablet (1,000 mg total) by mouth 2 (two) times daily with a meal.  . nadolol (CORGARD) 20 MG tablet Take 1 tablet (20 mg total) by mouth daily.  Marland Kitchen oxyCODONE-acetaminophen (PERCOCET) 10-325 MG tablet Take 1 tablet by mouth every 6 (six) hours as needed for pain.  . pantoprazole (PROTONIX) 40 MG tablet Take 1 tablet (40 mg total) by mouth daily.  . potassium chloride SA (KLOR-CON) 20 MEQ tablet Take 2 tablets (40 mEq total) by mouth 2 (two) times daily.  Marland Kitchen torsemide (DEMADEX) 20 MG tablet Take 3 tablets (60 mg total) by mouth 2 (two) times daily. (Patient taking differently: Take 40 mg by mouth 2 (two) times daily.)  . TRUEplus Lancets 30G MISC Check BS daily Dx E11.9  . [DISCONTINUED] albuterol (PROVENTIL HFA;VENTOLIN HFA) 108 (90 Base) MCG/ACT inhaler Inhale 2 puffs into the lungs every 6 (six) hours as needed for  wheezing or shortness of breath.   No facility-administered encounter medications on file as of 07/17/2020.    ALLERGIES:  Allergies  Allergen Reactions  . Keflex [Cephalexin] Nausea And Vomiting     PHYSICAL EXAM:  ECOG PERFORMANCE STATUS: 2 - Symptomatic, <50% confined to bed  There were no vitals filed for this visit. There were no vitals filed for this visit. Physical Exam Constitutional:      Appearance: Normal appearance. She is obese.  HENT:  Head: Normocephalic and atraumatic.     Mouth/Throat:     Mouth: Mucous membranes are moist.  Eyes:     Extraocular Movements: Extraocular movements intact.     Pupils: Pupils are equal, round, and reactive to light.  Cardiovascular:     Rate and Rhythm: Normal rate and regular rhythm.     Pulses: Normal pulses.     Heart sounds: Normal heart sounds.  Pulmonary:     Effort: Pulmonary effort is normal.     Breath sounds: Examination of the right-lower field reveals decreased breath sounds and rales. Examination of the left-lower field reveals decreased breath sounds and rales. Decreased breath sounds and rales present.  Abdominal:     General: Bowel sounds are normal.     Palpations: Abdomen is soft.     Tenderness: There is no abdominal tenderness.  Musculoskeletal:        General: No swelling.     Right lower leg: 2+ Pitting Edema present.     Left lower leg: 2+ Edema present.  Lymphadenopathy:     Cervical: No cervical adenopathy.  Skin:    General: Skin is warm and dry.     Coloration: Skin is pale.  Neurological:     General: No focal deficit present.     Mental Status: She is alert and oriented to person, place, and time.  Psychiatric:        Mood and Affect: Mood normal.        Behavior: Behavior normal.      LABORATORY DATA:  I have reviewed the labs as listed.  CBC    Component Value Date/Time   WBC 4.1 07/16/2020 1119   RBC 2.61 (L) 07/16/2020 1119   HGB 6.4 (LL) 07/16/2020 1119   HGB 7.8 (LL)  06/24/2020 1529   HCT 23.7 (L) 07/16/2020 1119   HCT 26.6 (L) 06/24/2020 1529   PLT 104 (L) 07/16/2020 1119   PLT 149 (L) 06/24/2020 1529   MCV 90.8 07/16/2020 1119   MCV 84 06/24/2020 1529   MCH 24.5 (L) 07/16/2020 1119   MCHC 27.0 (L) 07/16/2020 1119   RDW 17.5 (H) 07/16/2020 1119   RDW 18.4 (H) 06/24/2020 1529   LYMPHSABS 1.2 07/16/2020 1119   LYMPHSABS 1.8 06/24/2020 1529   MONOABS 0.4 07/16/2020 1119   EOSABS 0.0 07/16/2020 1119   EOSABS 0.1 06/24/2020 1529   BASOSABS 0.0 07/16/2020 1119   BASOSABS 0.0 06/24/2020 1529   CMP Latest Ref Rng & Units 07/09/2020 05/19/2020 05/17/2020  Glucose 70 - 99 mg/dL 100(H) 123(H) 115(H)  BUN 8 - 23 mg/dL _0 Creatinine 0.44 - 1.00 mg/dL 0.63 0.64 0.68  Sodium 135 - 145 mmol/L 137 134(L) 136  Potassium 3.5 - 5.1 mmol/L 3.6 3.6 4.1  Chloride 98 - 111 mmol/L 101 100 102  CO2 22 - 32 mmol/L _1 Calcium 8.9 - 10.3 mg/dL 8.5(L) 8.1(L) 8.1(L)  Total Protein 6.5 - 8.1 g/dL 8.6(H) 8.1 7.8  Total Bilirubin 0.3 - 1.2 mg/dL 0.3 0.6 0.3  Alkaline Phos 38 - 126 U/L 91 82 82  AST 15 - 41 U/L _2 ALT 0 - 44 U/L _3 DIAGNOSTIC IMAGING:  I have independently reviewed the relevant imaging and discussed with the patient.  ASSESSMENT: 1.  Iron deficiency anemia in the setting of GI blood loss -She has had three hospital admissions for GI bleeding in the past 6 months (October 2021, December  2021, February 2022) -Hgb was as low as 3.7 (01/22/2020), and she has had 8 blood transfusions. -Before the GI bleeding started in October 2021, her hemoglobin was within normal range. -Extensive endoscopic work-up, but source of chronic anemia has yet to be identified.  Last EGD (05/18/2020) showed grade 1 esophageal varices.  Last colonoscopy (January 2022) showed nonbleeding internal hemorrhoids, diverticulosis in the sigmoid and descending colon, and polypectomy. -She is scheduled for repeat EGD with small bowel endoscopy on 07/28/2020,  continues to follow with Dr. Abbey Chatters -Patient was started on oral ferrous sulfate by her PCP, but with little improvement - noted have Hgb 7.7 on 06/24/2020 -Suspect poor absorption of iron and is also on pantoprazole daily -She is currently symptomatic with severe fatigue and dyspnea on exertion - Labs on 07/09/2020 show severe iron deficiency (iron 13, iron saturation 3%) as well as worsening anemia (Hgb 7.1) on 07/09/2020.  Repeat CBC was therefore obtained on 07/16/2020, which showed that Hgb had dropped to 6.4, although patient reports that melena has resolved  2.  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  LDH normal at 106. - Symptomatic with severe fatigue, no other B symptoms - Calcium (8.5) and creatinine (0.63) are within normal limits - Patient has profound anemia with Hgb 6.4 - unclear at this time if this is due to possible multiple myeloma, GI bleed, or both  3.  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  Borderline vitamin B12 deficiency - Borderline Vitamin B12 deficiency evidenced by B12 227 and methylmalonic acid 439 (elevated).  Folate normal at 6.9.   PLAN:  1.  Iron deficiency anemia due to GI bleed - We will transfuse RBC x1 at the clinic today - Continue course of IV Venofer (has received 1 of 3 doses) - Continue GI follow-up with small bowel endoscopy scheduled for 07/28/2020 - Present to the emergency department if experiencing melena, hematochezia, or  hemoptysis - We will check weekly CBC/sample to blood bank until stabilized - Follow-up visit in 3 weeks, or sooner if needed  2.  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  Vitamin B12 deficiency - Start taking daily vitamin B12 supplements (1000 mcg cyanocobalamin daily) - Recheck B12 in 3 to 6 months  5.  Congestive heart failure - Mild fluid overload noted with peripheral edema and bibasilar crackles in lungs - patient reports that she did not take her Lasix yesterday or today - Explained importance of compliance with Lasix to the patient - In light of patient receiving blood transfusion later today, have informed her to be especially  vigilant to take her Lasix to avoid severe fluid overload - She was informed to present to the emergency department if she has worsening dyspnea, shortness of breath, chest pain, or other concerning signs or symptoms  PLAN SUMMARY & DISPOSITION: - We will schedule weekly CBCs and transfuse as needed if Hgb less than 7.0 - Continue IV Venna for - Bone marrow biopsy scheduled for next week with Dr. Delton Coombes - PET scan - Follow-up in 3 weeks to discuss results  All questions were answered. The patient knows to call the clinic with any problems, questions or concerns.  Medical decision making: High   1 acute illness posing threat to life/bodily function (severe anemia requiring blood transfusion)  1 new problem with uncertain prognosis under work-up  (MGUS versus multiple myeloma)   Review of prior test results, ordering new tests   Independent review of skeletal survey by me Tarri Abernethy PA-C), noting 1 possible lucency on lateral view of the skull, but pending official interpretation by radiologist  Extensive discussion with patient regarding nature of diagnoses and next steps   Decision not to hospitalize patient or sent to ED, but instead to transfuse in the outpatient setting and continue to monitor closely   Secure email communication with patient's PCP regarding management of patient's anemia with blood transfusions and work-up for possible myeloma  Time spent on visit: I spent 40 minutes counseling the patient face to face. The total time spent in the appointment was 55 minutes and more than 50% was on counseling.   Harriett Rush, PA-C  07/16/20 4:48 PM

## 2020-07-16 NOTE — Progress Notes (Signed)
CRITICAL VALUE STICKER  CRITICAL VALUE:  hgb 6.4  RECEIVER (on-site recipient of call):  A. Ouida Sills, RN  DATE & TIME NOTIFIED: 07/16/2020 at 1154  MD NOTIFIED:  Billey Co, PA-C  RESPONSE:  Transfuse 1 unit PRBC

## 2020-07-16 NOTE — Patient Instructions (Signed)
[place] at [facility]  Discharge Instructions:  Venofer _______________________________________________________________  Thank you for choosing [place] at [facility] to provide your oncology and hematology care.  To afford each patient quality time with our providers, please arrive at least 15 minutes before your scheduled appointment.  You need to re-schedule your appointment if you arrive 10 or more minutes late.  We strive to give you quality time with our providers, and arriving late affects you and other patients whose appointments are after yours.  Also, if you no show three or more times for appointments you may be dismissed from the clinic.  Again, thank you for choosing [place] at [place] hope is that these requests will allow you access to exceptional care and in a timely manner. _______________________________________________________________  If you have questions after your visit, please contact our office at [phone] between the hours of 8:30 a.m. and 5:00 p.m. Voicemails left after 4:30 p.m. will not be returned until the following business day. _______________________________________________________________  For prescription refill requests, have your pharmacy contact our office. _______________________________________________________________  Recommendations made by the consultant and any test results will be sent to your referring physician. _______________________________________________________________

## 2020-07-17 ENCOUNTER — Telehealth: Payer: Medicare PPO | Admitting: *Deleted

## 2020-07-17 ENCOUNTER — Inpatient Hospital Stay (HOSPITAL_COMMUNITY): Payer: Medicare PPO

## 2020-07-17 ENCOUNTER — Inpatient Hospital Stay (HOSPITAL_BASED_OUTPATIENT_CLINIC_OR_DEPARTMENT_OTHER): Payer: Medicare PPO | Admitting: Physician Assistant

## 2020-07-17 VITALS — BP 139/50 | HR 86 | Temp 97.1°F | Resp 18 | Wt 291.0 lb

## 2020-07-17 DIAGNOSIS — D500 Iron deficiency anemia secondary to blood loss (chronic): Secondary | ICD-10-CM | POA: Diagnosis not present

## 2020-07-17 DIAGNOSIS — D696 Thrombocytopenia, unspecified: Secondary | ICD-10-CM | POA: Diagnosis not present

## 2020-07-17 DIAGNOSIS — D472 Monoclonal gammopathy: Secondary | ICD-10-CM | POA: Diagnosis not present

## 2020-07-17 DIAGNOSIS — K922 Gastrointestinal hemorrhage, unspecified: Secondary | ICD-10-CM | POA: Diagnosis not present

## 2020-07-17 DIAGNOSIS — I509 Heart failure, unspecified: Secondary | ICD-10-CM | POA: Diagnosis not present

## 2020-07-17 DIAGNOSIS — D3502 Benign neoplasm of left adrenal gland: Secondary | ICD-10-CM | POA: Diagnosis not present

## 2020-07-17 DIAGNOSIS — K7581 Nonalcoholic steatohepatitis (NASH): Secondary | ICD-10-CM | POA: Diagnosis not present

## 2020-07-17 DIAGNOSIS — E119 Type 2 diabetes mellitus without complications: Secondary | ICD-10-CM | POA: Diagnosis not present

## 2020-07-17 DIAGNOSIS — D509 Iron deficiency anemia, unspecified: Secondary | ICD-10-CM | POA: Diagnosis not present

## 2020-07-17 DIAGNOSIS — K7460 Unspecified cirrhosis of liver: Secondary | ICD-10-CM | POA: Diagnosis not present

## 2020-07-17 DIAGNOSIS — D649 Anemia, unspecified: Secondary | ICD-10-CM

## 2020-07-17 LAB — BETA 2 MICROGLOBULIN, SERUM: Beta-2 Microglobulin: 2.8 mg/L — ABNORMAL HIGH (ref 0.6–2.4)

## 2020-07-17 MED ORDER — DIPHENHYDRAMINE HCL 25 MG PO CAPS
25.0000 mg | ORAL_CAPSULE | Freq: Once | ORAL | Status: AC
  Administered 2020-07-17: 25 mg via ORAL

## 2020-07-17 MED ORDER — ACETAMINOPHEN 325 MG PO TABS
ORAL_TABLET | ORAL | Status: AC
  Filled 2020-07-17: qty 2

## 2020-07-17 MED ORDER — SODIUM CHLORIDE 0.9% IV SOLUTION
250.0000 mL | Freq: Once | INTRAVENOUS | Status: AC
  Administered 2020-07-17: 250 mL via INTRAVENOUS

## 2020-07-17 MED ORDER — ACETAMINOPHEN 325 MG PO TABS
650.0000 mg | ORAL_TABLET | Freq: Once | ORAL | Status: AC
  Administered 2020-07-17: 650 mg via ORAL

## 2020-07-17 MED ORDER — DIPHENHYDRAMINE HCL 25 MG PO CAPS
ORAL_CAPSULE | ORAL | Status: AC
  Filled 2020-07-17: qty 1

## 2020-07-17 NOTE — Progress Notes (Signed)
One unit of blood given per orders. Patient tolerated it well without problems. Vitals stable and discharged home from clinic via wheelchair. Follow up as scheduled.

## 2020-07-17 NOTE — Patient Instructions (Signed)
[place]  Discharge Instructions: Thank you for choosing [facility] to provide your oncology and hematology care.  If you have a lab appointment with the [facility], please come in thru the Main Entrance and check in at the main information desk.  Wear comfortable clothing and clothing appropriate for easy access to any Portacath or PICC line.   We strive to give you quality time with your provider. You may need to reschedule your appointment if you arrive late (15 or more minutes).  Arriving late affects you and other patients whose appointments are after yours.  Also, if you miss three or more appointments without notifying the office, you may be dismissed from the clinic at the provider's discretion.      For prescription refill requests, have your pharmacy contact our office and allow 72 hours for refills to be completed.    Today you received the following: blood transfusion- one unit.   To help prevent nausea and vomiting after your treatment, we encourage you to take your nausea medication as directed.  BELOW ARE SYMPTOMS THAT SHOULD BE REPORTED IMMEDIATELY: . *FEVER GREATER THAN 100.4 F (38 C) OR HIGHER . *CHILLS OR SWEATING . *NAUSEA AND VOMITING THAT IS NOT CONTROLLED WITH YOUR NAUSEA MEDICATION . *UNUSUAL SHORTNESS OF BREATH . *UNUSUAL BRUISING OR BLEEDING . *URINARY PROBLEMS (pain or burning when urinating, or frequent urination) . *BOWEL PROBLEMS (unusual diarrhea, constipation, pain near the anus) . TENDERNESS IN MOUTH AND THROAT WITH OR WITHOUT PRESENCE OF ULCERS (sore throat, sores in mouth, or a toothache) . UNUSUAL RASH, SWELLING OR PAIN  . UNUSUAL VAGINAL DISCHARGE OR ITCHING   Items with * indicate a potential emergency and should be followed up as soon as possible or go to the Emergency Department if any problems should occur.  Please show the CHEMOTHERAPY ALERT CARD or IMMUNOTHERAPY ALERT CARD at check-in to the Emergency Department  and triage nurse.  Should you have questions after your visit or need to cancel or reschedule your appointment, please contact [facility] [phone]  and follow the prompts.  Office hours are 8:00 a.m. to 4:30 p.m. Monday - Friday. Please note that voicemails left after 4:00 p.m. may not be returned until the following business day.  We are closed weekends and major holidays. You have access to a nurse at all times for urgent questions. Please call the main number to the clinic [phone] and follow the prompts.  For any non-urgent questions, you may also contact your provider using MyChart. We now offer e-Visits for anyone 82 and older to request care online for non-urgent symptoms. For details visit [email or web address].   Also download the MyChart app! Go to the app store, search "MyChart", open the app, select [facility], and log in with your MyChart username and password.  Due to Covid, a mask is required upon entering the hospital/clinic. If you do not have a mask, one will be given to you upon arrival. For doctor visits, patients may have 1 support person aged 36 or older with them. For treatment visits, patients cannot have anyone with them due to current Covid guidelines and our immunocompromised population.

## 2020-07-17 NOTE — Patient Instructions (Addendum)
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    INSTRUCTIONS FOR ANEMIA:  Go to the emergency department if you have any more black/tarry stools or obvious bleeding in your bowel movements.  We will check your blood once per week until your levels are stable at a safe level.  Continue your IV iron infusions as previously scheduled.  You will receive a blood transfusion today due to your worsening anemia.  Continue to follow-up with gastroenterology for further testing to determine the source of your blood loss.  INSTRUCTIONS FOR MGUS:  We do not yet know what is causing your abnormal protein levels.  It may be that your protein levels are simply something that we can monitor over time, but there is also possibility that they are caused by a type of cancer called multiple myeloma.  It is important that we run other tests to see if you have multiple myeloma.  We will need to get a bone marrow biopsy and PET scan to help know what is going on in your body.  We will follow-up with you in office after these tests and we will discuss your diagnosis and treatment options at that time.  LABS: Weekly lab tests to check blood counts (CBC)  OTHER TESTS: Bone marrow biopsy and PET scan  MEDICATIONS: Continue IV iron.  Start taking daily B12 (cyanocobalamin) pill 1,000 mcg (over-the-counter supplement).  FOLLOW-UP APPOINTMENT: Return in 3 weeks to discuss results of biopsy and PET scan, or sooner if needed.   Thank you for choosing [place] at [facility] to provide your oncology and hematology care.  To afford each patient quality time with our provider, please arrive at least 15 minutes before your scheduled appointment time.   If you have a lab appointment with the [facility] please come in thru  the Main Entrance and check in at the main information desk.  You need to re-schedule your appointment should you arrive 10 or more minutes late.  We strive to give you quality time with our providers, and arriving late affects you and other patients whose appointments are after yours.  Also, if you no show three or more times for appointments you may be dismissed from the clinic at the providers discretion.     Again, thank you for choosing [facility]  Our hope is that these requests will decrease the amount of time that you wait before being seen by our physicians.       _____________________________________________________________  Should you have questions after your visit to [facility], please contact our office at [phone] and follow the prompts.  Our office hours are 8:00 a.m. and 4:30 p.m. Monday - Friday.  Please note that voicemails left after 4:00 p.m. may not be returned until the following business day.  We are closed weekends and major holidays.  You do have access to a nurse 24-7, just call the main number to the clinic [phone] and do not press any options, hold on the line and a nurse will answer the phone.    For prescription refill requests, have your pharmacy contact our office and allow 72 hours.    Due to Covid, you will need to wear a mask upon entering the hospital. If you do not have a mask, a mask will be given  to you at the Main Entrance upon arrival. For doctor visits, patients may have 1 support person age 54 or older with them. For treatment visits, patients can not have anyone with them due to social distancing guidelines and our immunocompromised population.

## 2020-07-18 LAB — TYPE AND SCREEN
ABO/RH(D): A POS
Antibody Screen: NEGATIVE
Unit division: 0

## 2020-07-18 LAB — BPAM RBC
Blood Product Expiration Date: 202205062359
ISSUE DATE / TIME: 202204221032
Unit Type and Rh: 6200

## 2020-07-20 ENCOUNTER — Ambulatory Visit (HOSPITAL_COMMUNITY): Payer: Medicare PPO

## 2020-07-20 ENCOUNTER — Other Ambulatory Visit (HOSPITAL_COMMUNITY): Payer: Medicare PPO

## 2020-07-21 ENCOUNTER — Other Ambulatory Visit: Payer: Self-pay

## 2020-07-21 ENCOUNTER — Inpatient Hospital Stay (HOSPITAL_COMMUNITY): Payer: Medicare PPO

## 2020-07-21 ENCOUNTER — Other Ambulatory Visit (HOSPITAL_COMMUNITY): Payer: Self-pay | Admitting: Physician Assistant

## 2020-07-21 VITALS — BP 119/43 | HR 81 | Temp 96.9°F | Resp 18

## 2020-07-21 DIAGNOSIS — I509 Heart failure, unspecified: Secondary | ICD-10-CM | POA: Diagnosis not present

## 2020-07-21 DIAGNOSIS — D472 Monoclonal gammopathy: Secondary | ICD-10-CM

## 2020-07-21 DIAGNOSIS — D509 Iron deficiency anemia, unspecified: Secondary | ICD-10-CM | POA: Diagnosis not present

## 2020-07-21 DIAGNOSIS — K7581 Nonalcoholic steatohepatitis (NASH): Secondary | ICD-10-CM | POA: Diagnosis not present

## 2020-07-21 DIAGNOSIS — K7460 Unspecified cirrhosis of liver: Secondary | ICD-10-CM | POA: Diagnosis not present

## 2020-07-21 DIAGNOSIS — D696 Thrombocytopenia, unspecified: Secondary | ICD-10-CM | POA: Diagnosis not present

## 2020-07-21 DIAGNOSIS — E119 Type 2 diabetes mellitus without complications: Secondary | ICD-10-CM | POA: Diagnosis not present

## 2020-07-21 DIAGNOSIS — D3502 Benign neoplasm of left adrenal gland: Secondary | ICD-10-CM | POA: Diagnosis not present

## 2020-07-21 DIAGNOSIS — D500 Iron deficiency anemia secondary to blood loss (chronic): Secondary | ICD-10-CM

## 2020-07-21 DIAGNOSIS — K922 Gastrointestinal hemorrhage, unspecified: Secondary | ICD-10-CM | POA: Diagnosis not present

## 2020-07-21 LAB — COPPER, SERUM: Copper: 166 ug/dL — ABNORMAL HIGH (ref 80–158)

## 2020-07-21 MED ORDER — SODIUM CHLORIDE 0.9 % IV SOLN
300.0000 mg | Freq: Once | INTRAVENOUS | Status: AC
  Administered 2020-07-21: 300 mg via INTRAVENOUS
  Filled 2020-07-21: qty 300

## 2020-07-21 MED ORDER — ACETAMINOPHEN 325 MG PO TABS
650.0000 mg | ORAL_TABLET | Freq: Once | ORAL | Status: AC
  Administered 2020-07-21: 650 mg via ORAL
  Filled 2020-07-21: qty 2

## 2020-07-21 MED ORDER — SODIUM CHLORIDE 0.9 % IV SOLN: Freq: Once | INTRAVENOUS | Status: AC

## 2020-07-21 NOTE — Patient Instructions (Signed)
[place]  Discharge Instructions: Thank you for choosing [facility] to provide your oncology and hematology care.  If you have a lab appointment with the [facility], please come in thru the Main Entrance and check in at the main information desk.  Wear comfortable clothing and clothing appropriate for easy access to any Portacath or PICC line.   We strive to give you quality time with your provider. You may need to reschedule your appointment if you arrive late (15 or more minutes).  Arriving late affects you and other patients whose appointments are after yours.  Also, if you miss three or more appointments without notifying the office, you may be dismissed from the clinic at the provider's discretion.      For prescription refill requests, have your pharmacy contact our office and allow 72 hours for refills to be completed.    Today you received the following: Iron infusion.        Items with * indicate a potential emergency and should be followed up as soon as possible or go to the Emergency Department if any problems should occur.  Please show the CHEMOTHERAPY ALERT CARD or IMMUNOTHERAPY ALERT CARD at check-in to the Emergency Department and triage nurse.  Should you have questions after your visit or need to cancel or reschedule your appointment, please contact [facility] [phone]  and follow the prompts.  Office hours are 8:00 a.m. to 4:30 p.m. Monday - Friday. Please note that voicemails left after 4:00 p.m. may not be returned until the following business day.  We are closed weekends and major holidays. You have access to a nurse at all times for urgent questions. Please call the main number to the clinic [phone] and follow the prompts.  For any non-urgent questions, you may also contact your provider using MyChart. We now offer e-Visits for anyone 19 and older to request care online for non-urgent symptoms. For details visit  [email or web address].   Also download the MyChart app! Go to the app store, search "MyChart", open the app, select [place], and log in with your MyChart username and password.  Due to Covid, a mask is required upon entering the hospital/clinic. If you do not have a mask, one will be given to you upon arrival. For doctor visits, patients may have 1 support person aged 36 or older with them. For treatment visits, patients cannot have anyone with them due to current Covid guidelines and our immunocompromised population.

## 2020-07-21 NOTE — Progress Notes (Signed)
Iron infusion given per orders. Patient tolerated it well without problems. Vitals stable and discharged home from clinic via wheelchair. Follow up as scheduled.  

## 2020-07-23 ENCOUNTER — Other Ambulatory Visit: Payer: Self-pay

## 2020-07-23 ENCOUNTER — Inpatient Hospital Stay (HOSPITAL_COMMUNITY): Payer: Medicare PPO

## 2020-07-23 ENCOUNTER — Encounter (HOSPITAL_COMMUNITY): Payer: Self-pay

## 2020-07-23 ENCOUNTER — Encounter (HOSPITAL_COMMUNITY): Payer: Medicare PPO | Admitting: Hematology

## 2020-07-23 VITALS — BP 101/48 | HR 72 | Temp 97.9°F | Resp 18

## 2020-07-23 DIAGNOSIS — D472 Monoclonal gammopathy: Secondary | ICD-10-CM | POA: Diagnosis not present

## 2020-07-23 DIAGNOSIS — E119 Type 2 diabetes mellitus without complications: Secondary | ICD-10-CM | POA: Diagnosis not present

## 2020-07-23 DIAGNOSIS — D500 Iron deficiency anemia secondary to blood loss (chronic): Secondary | ICD-10-CM

## 2020-07-23 DIAGNOSIS — K7460 Unspecified cirrhosis of liver: Secondary | ICD-10-CM | POA: Diagnosis not present

## 2020-07-23 DIAGNOSIS — D696 Thrombocytopenia, unspecified: Secondary | ICD-10-CM | POA: Diagnosis not present

## 2020-07-23 DIAGNOSIS — D3502 Benign neoplasm of left adrenal gland: Secondary | ICD-10-CM | POA: Diagnosis not present

## 2020-07-23 DIAGNOSIS — D509 Iron deficiency anemia, unspecified: Secondary | ICD-10-CM | POA: Diagnosis not present

## 2020-07-23 DIAGNOSIS — K7581 Nonalcoholic steatohepatitis (NASH): Secondary | ICD-10-CM | POA: Diagnosis not present

## 2020-07-23 DIAGNOSIS — I509 Heart failure, unspecified: Secondary | ICD-10-CM | POA: Diagnosis not present

## 2020-07-23 DIAGNOSIS — K922 Gastrointestinal hemorrhage, unspecified: Secondary | ICD-10-CM | POA: Diagnosis not present

## 2020-07-23 LAB — CBC WITH DIFFERENTIAL/PLATELET
Abs Immature Granulocytes: 0.04 10*3/uL (ref 0.00–0.07)
Basophils Absolute: 0 10*3/uL (ref 0.0–0.1)
Basophils Relative: 0 %
Eosinophils Absolute: 0.1 10*3/uL (ref 0.0–0.5)
Eosinophils Relative: 1 %
HCT: 26.4 % — ABNORMAL LOW (ref 36.0–46.0)
Hemoglobin: 7.2 g/dL — ABNORMAL LOW (ref 12.0–15.0)
Immature Granulocytes: 1 %
Lymphocytes Relative: 27 %
Lymphs Abs: 1.7 10*3/uL (ref 0.7–4.0)
MCH: 25.6 pg — ABNORMAL LOW (ref 26.0–34.0)
MCHC: 27.3 g/dL — ABNORMAL LOW (ref 30.0–36.0)
MCV: 94 fL (ref 80.0–100.0)
Monocytes Absolute: 0.6 10*3/uL (ref 0.1–1.0)
Monocytes Relative: 10 %
Neutro Abs: 3.9 10*3/uL (ref 1.7–7.7)
Neutrophils Relative %: 61 %
Platelets: 114 10*3/uL — ABNORMAL LOW (ref 150–400)
RBC: 2.81 MIL/uL — ABNORMAL LOW (ref 3.87–5.11)
RDW: 22.5 % — ABNORMAL HIGH (ref 11.5–15.5)
WBC: 6.4 10*3/uL (ref 4.0–10.5)
nRBC: 0 % (ref 0.0–0.2)

## 2020-07-23 LAB — SAMPLE TO BLOOD BANK

## 2020-07-23 MED ORDER — ACETAMINOPHEN 325 MG PO TABS
ORAL_TABLET | ORAL | Status: AC
  Filled 2020-07-23: qty 2

## 2020-07-23 MED ORDER — SODIUM CHLORIDE 0.9 % IV SOLN: Freq: Once | INTRAVENOUS | Status: AC

## 2020-07-23 MED ORDER — ACETAMINOPHEN 325 MG PO TABS
650.0000 mg | ORAL_TABLET | Freq: Once | ORAL | Status: AC
  Administered 2020-07-23: 650 mg via ORAL

## 2020-07-23 MED ORDER — SODIUM CHLORIDE 0.9 % IV SOLN
400.0000 mg | Freq: Once | INTRAVENOUS | Status: AC
  Administered 2020-07-23: 400 mg via INTRAVENOUS
  Filled 2020-07-23: qty 20

## 2020-07-23 NOTE — Progress Notes (Signed)
Patient presents today for IV Venofer infusion. Vital signs are stable. Patient denies any changes since the last iron infusion. Patient denies any complaints today. MAR reviewed and updated.   Venofer given today per MD orders. Tolerated infusion without adverse affects. Vital signs stable. No complaints at this time. Discharged from clinic via wheel chair in stable condition. Alert and oriented x 3. F/U with [facility] as scheduled.

## 2020-07-23 NOTE — Pre-Procedure Instructions (Signed)
Results of today's CBC routed to Dr Abbey Chatters.

## 2020-07-23 NOTE — Patient Instructions (Signed)
[place]  07/23/2020     @PREFPERIOPPHARMACY @   Your procedure is scheduled on  07/28/2020.   Report to [facility] at  [facility]   Call this number if you have problems the morning of surgery:  [phone]   Remember:  Follow the diet and prep instructions given to you by the office.                        Take these medicines the morning of surgery with A SIP OF WATER  Nadolol, oxycodone (if needed), protonix.     Please brush your teeth.  Do not wear jewelry, make-up or nail polish.  Do not wear lotions, powders, or perfumes, or deodorant.  Do not shave 48 hours prior to surgery.  Men may shave face and neck.  Do not bring valuables to the hospital.  [facility] is not responsible for any belongings or valuables.  Contacts, dentures or bridgework may not be worn into surgery.  Leave your suitcase in the car.  After surgery it may be brought to your room.  For patients admitted to the hospital, discharge time will be determined by your treatment team.  Patients discharged the day of surgery will not be allowed to drive home and must have someone with them for 24 hours.    Special instructions:  DO NOT smoke tobacco or vape for 24 hours before your procedure.   Please read over the following fact sheets that you were given. Anesthesia Post-op Instructions and Care and Recovery After Surgery       Upper Endoscopy, Adult, Care After This sheet gives you information about how to care for yourself after your procedure. Your health care provider may also give you more specific instructions. If you have problems or questions, contact your health care provider. What can I expect after the procedure? After the procedure, it is common to have:  A sore throat.  Mild stomach pain or discomfort.  Bloating.  Nausea. Follow these instructions at home:  Follow instructions from your health care provider about what to eat or drink after your  procedure.  Return to your normal activities as told by your health care provider. Ask your health care provider what activities are safe for you.  Take over-the-counter and prescription medicines only as told by your health care provider.  If you were given a sedative during the procedure, it can affect you for several hours. Do not drive or operate machinery until your health care provider says that it is safe.  Keep all follow-up visits as told by your health care provider. This is important.   Contact a health care provider if you have:  A sore throat that lasts longer than one day.  Trouble swallowing. Get help right away if:  You vomit blood or your vomit looks like coffee grounds.  You have: ? A fever. ? Bloody, black, or tarry stools. ? A severe sore throat or you cannot swallow. ? Difficulty breathing. ? Severe pain in your chest or abdomen. Summary  After the procedure, it is common to have a sore throat, mild stomach discomfort, bloating, and nausea.  If you were given a sedative during the procedure, it can affect you for several hours. Do not drive or operate machinery until your health care provider says that it is safe.  Follow instructions from your health care provider about what to eat or drink after  your procedure.  Return to your normal activities as told by your health care provider. This information is not intended to replace advice given to you by your health care provider. Make sure you discuss any questions you have with your health care provider. Document Revised: 03/12/2019 Document Reviewed: 08/14/2017 Elsevier Patient Education  2021 [place] After This sheet gives you information about how to care for yourself after your procedure. Your health care provider may also give you more specific instructions. If you have problems or questions, contact your health care provider. What can I expect after the  procedure? After the procedure, it is common to have:  Tiredness.  Forgetfulness about what happened after the procedure.  Impaired judgment for important decisions.  Nausea or vomiting.  Some difficulty with balance. Follow these instructions at home: For the time period you were told by your health care provider:  Rest as needed.  Do not participate in activities where you could fall or become injured.  Do not drive or use machinery.  Do not drink alcohol.  Do not take sleeping pills or medicines that cause drowsiness.  Do not make important decisions or sign legal documents.  Do not take care of children on your own.      Eating and drinking  Follow the diet that is recommended by your health care provider.  Drink enough fluid to keep your urine pale yellow.  If you vomit: ? Drink water, juice, or soup when you can drink without vomiting. ? Make sure you have little or no nausea before eating solid foods. General instructions  Have a responsible adult stay with you for the time you are told. It is important to have someone help care for you until you are awake and alert.  Take over-the-counter and prescription medicines only as told by your health care provider.  If you have sleep apnea, surgery and certain medicines can increase your risk for breathing problems. Follow instructions from your health care provider about wearing your sleep device: ? Anytime you are sleeping, including during daytime naps. ? While taking prescription pain medicines, sleeping medicines, or medicines that make you drowsy.  Avoid smoking.  Keep all follow-up visits as told by your health care provider. This is important. Contact a health care provider if:  You keep feeling nauseous or you keep vomiting.  You feel light-headed.  You are still sleepy or having trouble with balance after 24 hours.  You develop a rash.  You have a fever.  You have redness or swelling around  the IV site. Get help right away if:  You have trouble breathing.  You have new-onset confusion at home. Summary  For several hours after your procedure, you may feel tired. You may also be forgetful and have poor judgment.  Have a responsible adult stay with you for the time you are told. It is important to have someone help care for you until you are awake and alert.  Rest as told. Do not drive or operate machinery. Do not drink alcohol or take sleeping pills.  Get help right away if you have trouble breathing, or if you suddenly become confused. This information is not intended to replace advice given to you by your health care provider. Make sure you discuss any questions you have with your health care provider. Document Revised: 11/28/2019 Document Reviewed: 02/14/2019 Elsevier Patient Education  2021 [employer].

## 2020-07-23 NOTE — Patient Instructions (Signed)
[place]  Discharge Instructions: Thank you for choosing [facility] to provide your oncology and hematology care.  If you have a lab appointment with the [facility], please come in thru the Main Entrance and check in at the main information desk.  We strive to give you quality time with your provider. You may need to reschedule your appointment if you arrive late (15 or more minutes).  Arriving late affects you and other patients whose appointments are after yours.  Also, if you miss three or more appointments without notifying the office, you may be dismissed from the clinic at the provider's discretion.      For prescription refill requests, have your pharmacy contact our office and allow 72 hours for refills to be completed.    Today you received the following Venofer infusion.    To help prevent nausea and vomiting after your treatment, we encourage you to take your nausea medication as directed.  BELOW ARE SYMPTOMS THAT SHOULD BE REPORTED IMMEDIATELY: . *FEVER GREATER THAN 100.4 F (38 C) OR HIGHER . *CHILLS OR SWEATING . *NAUSEA AND VOMITING THAT IS NOT CONTROLLED WITH YOUR NAUSEA MEDICATION . *UNUSUAL SHORTNESS OF BREATH . *UNUSUAL BRUISING OR BLEEDING . *URINARY PROBLEMS (pain or burning when urinating, or frequent urination) . *BOWEL PROBLEMS (unusual diarrhea, constipation, pain near the anus) . TENDERNESS IN MOUTH AND THROAT WITH OR WITHOUT PRESENCE OF ULCERS (sore throat, sores in mouth, or a toothache) . UNUSUAL RASH, SWELLING OR PAIN  . UNUSUAL VAGINAL DISCHARGE OR ITCHING   Items with * indicate a potential emergency and should be followed up as soon as possible or go to the Emergency Department if any problems should occur.  Please show the CHEMOTHERAPY ALERT CARD or IMMUNOTHERAPY ALERT CARD at check-in to the Emergency Department and triage nurse.  Should you have questions after your visit or need to cancel or reschedule your  appointment, please contact [name] [phone]  and follow the prompts.  Office hours are 8:00 a.m. to 4:30 p.m. Monday - Friday. Please note that voicemails left after 4:00 p.m. may not be returned until the following business day.  We are closed weekends and major holidays. You have access to a nurse at all times for urgent questions. Please call the main number to the clinic [phone] and follow the prompts.  For any non-urgent questions, you may also contact your provider using MyChart. We now offer e-Visits for anyone 73 and older to request care online for non-urgent symptoms. For details visit [email or web address].   Also download the MyChart app! Go to the app store, search "MyChart", open the app, select [facility], and log in with your MyChart username and password.  Due to Covid, a mask is required upon entering the hospital/clinic. If you do not have a mask, one will be given to you upon arrival. For doctor visits, patients may have 1 support person aged 48 or older with them. For treatment visits, patients cannot have anyone with them due to current Covid guidelines and our immunocompromised population.

## 2020-07-24 ENCOUNTER — Ambulatory Visit (INDEPENDENT_AMBULATORY_CARE_PROVIDER_SITE_OTHER): Payer: Medicare PPO

## 2020-07-24 VITALS — Ht 62.0 in | Wt 290.0 lb

## 2020-07-24 DIAGNOSIS — Z0000 Encounter for general adult medical examination without abnormal findings: Secondary | ICD-10-CM | POA: Diagnosis not present

## 2020-07-24 DIAGNOSIS — J449 Chronic obstructive pulmonary disease, unspecified: Secondary | ICD-10-CM | POA: Diagnosis not present

## 2020-07-24 NOTE — Patient Instructions (Signed)
Savannah Burns , Thank you for taking time to come for your Medicare Wellness Visit. I appreciate your ongoing commitment to your health goals. Please review the following plan we discussed and let me know if I can assist you in the future.   Screening recommendations/referrals: Colonoscopy: Done 04/07/20 - Repeat 10 years Mammogram: Declines Bone Density: Likely done at Hematology/oncology appt Recommended yearly ophthalmology/optometry visit for glaucoma screening and checkup Recommended yearly dental visit for hygiene and checkup  Vaccinations: Influenza vaccine: Due every fall Pneumococcal vaccine: Done 03/01/2014 & 09/08/2015 Tdap vaccine: Done 05/27/2002 - PAST DUE (every 10 years) Shingles vaccine: Shingrix discussed. Please contact your pharmacy for coverage information.    Covid-19: had one, unknown date - had bad reaction - declines additional doses  Advanced directives: Please bring a copy of your health care power of attorney and living will to the office to be added to your chart at your convenience.  Conditions/risks identified: Aim for 30 minutes of exercise or brisk walking each day, drink 6-8 glasses of water and eat lots of fruits and vegetables.  Next appointment: Follow up in one year for your annual wellness visit    Preventive Care 65 Years and Older, Female Preventive care refers to lifestyle choices and visits with your health care provider that can promote health and wellness. What does preventive care include?  A yearly physical exam. This is also called an annual well check.  Dental exams once or twice a year.  Routine eye exams. Ask your health care provider how often you should have your eyes checked.  Personal lifestyle choices, including:  Daily care of your teeth and gums.  Regular physical activity.  Eating a healthy diet.  Avoiding tobacco and drug use.  Limiting alcohol use.  Practicing safe sex.  Taking low-dose aspirin every day.  Taking  vitamin and mineral supplements as recommended by your health care provider. What happens during an annual well check? The services and screenings done by your health care provider during your annual well check will depend on your age, overall health, lifestyle risk factors, and family history of disease. Counseling  Your health care provider may ask you questions about your:  Alcohol use.  Tobacco use.  Drug use.  Emotional well-being.  Home and relationship well-being.  Sexual activity.  Eating habits.  History of falls.  Memory and ability to understand (cognition).  Work and work Statistician.  Reproductive health. Screening  You may have the following tests or measurements:  Height, weight, and BMI.  Blood pressure.  Lipid and cholesterol levels. These may be checked every 5 years, or more frequently if you are over 34 years old.  Skin check.  Lung cancer screening. You may have this screening every year starting at age 79 if you have a 30-pack-year history of smoking and currently smoke or have quit within the past 15 years.  Fecal occult blood test (FOBT) of the stool. You may have this test every year starting at age 56.  Flexible sigmoidoscopy or colonoscopy. You may have a sigmoidoscopy every 5 years or a colonoscopy every 10 years starting at age 50.  Hepatitis C blood test.  Hepatitis B blood test.  Sexually transmitted disease (STD) testing.  Diabetes screening. This is done by checking your blood sugar (glucose) after you have not eaten for a while (fasting). You may have this done every 1-3 years.  Bone density scan. This is done to screen for osteoporosis. You may have this done starting at  age 10.  Mammogram. This may be done every 1-2 years. Talk to your health care provider about how often you should have regular mammograms. Talk with your health care provider about your test results, treatment options, and if necessary, the need for more  tests. Vaccines  Your health care provider may recommend certain vaccines, such as:  Influenza vaccine. This is recommended every year.  Tetanus, diphtheria, and acellular pertussis (Tdap, Td) vaccine. You may need a Td booster every 10 years.  Zoster vaccine. You may need this after age 17.  Pneumococcal 13-valent conjugate (PCV13) vaccine. One dose is recommended after age 7.  Pneumococcal polysaccharide (PPSV23) vaccine. One dose is recommended after age 56. Talk to your health care provider about which screenings and vaccines you need and how often you need them. This information is not intended to replace advice given to you by your health care provider. Make sure you discuss any questions you have with your health care provider. Document Released: 04/10/2015 Document Revised: 12/02/2015 Document Reviewed: 01/13/2015 Elsevier Interactive Patient Education  2017 [place] Prevention in the Home Falls can cause injuries. They can happen to people of all ages. There are many things you can do to make your home safe and to help prevent falls. What can I do on the outside of my home?  Regularly fix the edges of walkways and driveways and fix any cracks.  Remove anything that might make you trip as you walk through a door, such as a raised step or threshold.  Trim any bushes or trees on the path to your home.  Use bright outdoor lighting.  Clear any walking paths of anything that might make someone trip, such as rocks or tools.  Regularly check to see if handrails are loose or broken. Make sure that both sides of any steps have handrails.  Any raised decks and porches should have guardrails on the edges.  Have any leaves, snow, or ice cleared regularly.  Use sand or salt on walking paths during winter.  Clean up any spills in your garage right away. This includes oil or grease spills. What can I do in the bathroom?  Use night lights.  Install grab bars by the  toilet and in the tub and shower. Do not use towel bars as grab bars.  Use non-skid mats or decals in the tub or shower.  If you need to sit down in the shower, use a plastic, non-slip stool.  Keep the floor dry. Clean up any water that spills on the floor as soon as it happens.  Remove soap buildup in the tub or shower regularly.  Attach bath mats securely with double-sided non-slip rug tape.  Do not have throw rugs and other things on the floor that can make you trip. What can I do in the bedroom?  Use night lights.  Make sure that you have a light by your bed that is easy to reach.  Do not use any sheets or blankets that are too big for your bed. They should not hang down onto the floor.  Have a firm chair that has side arms. You can use this for support while you get dressed.  Do not have throw rugs and other things on the floor that can make you trip. What can I do in the kitchen?  Clean up any spills right away.  Avoid walking on wet floors.  Keep items that you use a lot in easy-to-reach places.  If you  need to reach something above you, use a strong step stool that has a grab bar.  Keep electrical cords out of the way.  Do not use floor polish or wax that makes floors slippery. If you must use wax, use non-skid floor wax.  Do not have throw rugs and other things on the floor that can make you trip. What can I do with my stairs?  Do not leave any items on the stairs.  Make sure that there are handrails on both sides of the stairs and use them. Fix handrails that are broken or loose. Make sure that handrails are as long as the stairways.  Check any carpeting to make sure that it is firmly attached to the stairs. Fix any carpet that is loose or worn.  Avoid having throw rugs at the top or bottom of the stairs. If you do have throw rugs, attach them to the floor with carpet tape.  Make sure that you have a light switch at the top of the stairs and the bottom of  the stairs. If you do not have them, ask someone to add them for you. What else can I do to help prevent falls?  Wear shoes that:  Do not have high heels.  Have rubber bottoms.  Are comfortable and fit you well.  Are closed at the toe. Do not wear sandals.  If you use a stepladder:  Make sure that it is fully opened. Do not climb a closed stepladder.  Make sure that both sides of the stepladder are locked into place.  Ask someone to hold it for you, if possible.  Clearly mark and make sure that you can see:  Any grab bars or handrails.  First and last steps.  Where the edge of each step is.  Use tools that help you move around (mobility aids) if they are needed. These include:  Canes.  Walkers.  Scooters.  Crutches.  Turn on the lights when you go into a dark area. Replace any light bulbs as soon as they burn out.  Set up your furniture so you have a clear path. Avoid moving your furniture around.  If any of your floors are uneven, fix them.  If there are any pets around you, be aware of where they are.  Review your medicines with your doctor. Some medicines can make you feel dizzy. This can increase your chance of falling. Ask your doctor what other things that you can do to help prevent falls. This information is not intended to replace advice given to you by your health care provider. Make sure you discuss any questions you have with your health care provider. Document Released: 01/08/2009 Document Revised: 08/20/2015 Document Reviewed: 04/18/2014 Elsevier Interactive Patient Education  2017 [employer].

## 2020-07-24 NOTE — Progress Notes (Signed)
Subjective:   Savannah Burns is a 70 y.o. female who presents for Medicare Annual (Subsequent) preventive examination.  Virtual Visit via Telephone Note  I connected with  [name] on 07/24/20 at 11:15 AM EDT by telephone and verified that I am speaking with the correct person using two identifiers.  Location: Patient: Home Provider: [facility] Persons participating in the virtual visit: patient/Nurse Health Advisor   I discussed the limitations, risks, security and privacy concerns of performing an evaluation and management service by telephone and the availability of in person appointments. The patient expressed understanding and agreed to proceed.  Interactive audio and video telecommunications were attempted between this nurse and patient, however failed, due to patient having technical difficulties OR patient did not have access to video capability.  We continued and completed visit with audio only.  Some vital signs may be absent or patient reported.   Imojean Yoshino E Yuli Lanigan, LPN   Review of Systems     Cardiac Risk Factors include: advanced age (>10mn, >>31women);diabetes mellitus;dyslipidemia;sedentary lifestyle;obesity (BMI >30kg/m2)     Objective:    Today's Vitals   07/24/20 1058 07/24/20 1059  Weight: 290 lb (131.5 kg)   Height: 5' 2"  (1.575 m)   PainSc:  0-No pain   Body mass index is 53.04 kg/m.  Advanced Directives 07/24/2020 07/23/2020 07/17/2020 07/16/2020 07/09/2020 05/17/2020 05/16/2020  Does Patient Have a Medical Advance Directive? No No No No No No No  Does patient want to make changes to medical advance directive? No - Patient declined No - Patient declined No - Patient declined - - - -  Would patient like information on creating a medical advance directive? - No - Patient declined No - Patient declined No - Patient declined No - Patient declined No - Patient declined -    Current Medications (verified) Outpatient Encounter Medications as of 07/24/2020   Medication Sig  . Blood Glucose Calibration (GLUCOMETER DEX HIGH CONTROL) LIQD Test CBG's once daily.   . Blood Glucose Monitoring Suppl (TRUE METRIX AIR GLUCOSE METER) w/Device KIT Check BS daily Dx E11.9  . Cholecalciferol (VITAMIN D) 50 MCG (2000 UT) tablet Take 4,000 Units by mouth daily.  . ferrous sulfate 325 (65 FE) MG tablet Take 1 tablet (325 mg total) by mouth 2 (two) times daily with a meal. (Patient taking differently: Take 325 mg by mouth daily with breakfast.)  . glucose blood (TRUE METRIX BLOOD GLUCOSE TEST) test strip Check BS daily Dx E11.9  . lisinopril (ZESTRIL) 10 MG tablet Take 1 tablet by mouth once daily (Patient taking differently: Take 10 mg by mouth daily.)  . lovastatin (MEVACOR) 20 MG tablet Take 1 tablet (20 mg total) by mouth at bedtime.  . metFORMIN (GLUCOPHAGE) 1000 MG tablet Take 1 tablet (1,000 mg total) by mouth 2 (two) times daily with a meal.  . nadolol (CORGARD) 20 MG tablet Take 1 tablet (20 mg total) by mouth daily.  .Marland KitchenoxyCODONE-acetaminophen (PERCOCET) 10-325 MG tablet Take 1 tablet by mouth every 6 (six) hours as needed for pain.  . OXYGEN Inhale 3 L into the lungs continuous.  . pantoprazole (PROTONIX) 40 MG tablet Take 1 tablet (40 mg total) by mouth daily.  . potassium chloride SA (KLOR-CON) 20 MEQ tablet Take 2 tablets (40 mEq total) by mouth 2 (two) times daily. (Patient taking differently: Take 40 mEq by mouth daily.)  . sodium chloride (OCEAN) 0.65 % SOLN nasal spray Place 1 spray into both nostrils as needed for congestion.  .Marland Kitchen  TRUEplus Lancets 30G MISC Check BS daily Dx E11.9  . vitamin B-12 (CYANOCOBALAMIN) 1000 MCG tablet Take 2,000 mcg by mouth daily.  Marland Kitchen torsemide (DEMADEX) 20 MG tablet Take 3 tablets (60 mg total) by mouth 2 (two) times daily. (Patient taking differently: Take 50 mg by mouth 2 (two) times daily.)  . [DISCONTINUED] albuterol (PROVENTIL HFA;VENTOLIN HFA) 108 (90 Base) MCG/ACT inhaler Inhale 2 puffs into the lungs every 6 (six)  hours as needed for wheezing or shortness of breath.   No facility-administered encounter medications on file as of 07/24/2020.    Allergies (verified) Keflex [cephalexin]   History: Past Medical History:  Diagnosis Date  . Adrenal adenoma, left    Stable  . Anxiety   . Arthritis    bilateral hands  . Depression   . Diabetes mellitus, type 2 ([place]) 08/12/2008   Qualifier: Diagnosis of  By: Deborra Medina MD, Tanja Port    . Dyspnea   . Esophageal varices ([place])   . Grade II diastolic dysfunction   . History of kidney stones   . Hyperlipidemia   . Hypertension   . Lower back pain   . Lower GI bleed 03/19/2020  . Panic attacks   . Pneumonia    currently taking antibiotic and prednisone for early stages of pneumonia  . Pulmonary nodules    bilateral  . Skin cancer    face   Past Surgical History:  Procedure Laterality Date  . BIOPSY  04/07/2020   Procedure: BIOPSY;  Surgeon: Eloise Harman, DO;  [facility];  Service: Endoscopy;;  . Breast Cystectomy  Right   . CESAREAN SECTION    . COLONOSCOPY WITH PROPOFOL N/A 01/25/2020   Dr. Abbey Chatters: Nonbleeding internal hemorrhoids, diverticulosis, 5 mm polyp removed from the ascending colon, 10 mm polyp removed from the sigmoid colon, 30 mm polyp (tubulovillous adenoma with no high-grade dysplasia) removed from the transverse colon via piecemeal status post tattoo.  Other polyps were tubular adenomas.  3 month surveillance colonoscopy recommended.  . COLONOSCOPY WITH PROPOFOL N/A 04/07/2020   Procedure: COLONOSCOPY WITH PROPOFOL;  Surgeon: Eloise Harman, DO;  [facility];  Service: Endoscopy;  Laterality: N/A;  3:00pm, pt knows new time per office  . CYSTOSCOPY/URETEROSCOPY/HOLMIUM LASER/STENT PLACEMENT Bilateral 03/01/2019   Procedure: CYSTOSCOPY/RETROGRADEURETEROSCOPY/HOLMIUM LASER/STENT PLACEMENT;  Surgeon: Ceasar Mons, MD;  Location: [facility];  Service: Urology;  Laterality: Bilateral;  ONLY NEEDS 60 MIN  .  ESOPHAGOGASTRODUODENOSCOPY (EGD) WITH PROPOFOL N/A 01/25/2020   Dr. Abbey Chatters: 4 columns grade 1 esophageal varices  . ESOPHAGOGASTRODUODENOSCOPY (EGD) WITH PROPOFOL N/A 05/18/2020   Procedure: ESOPHAGOGASTRODUODENOSCOPY (EGD) WITH PROPOFOL;  Surgeon: Eloise Harman, DO;  [facility];  Service: Endoscopy;  Laterality: N/A;  . GIVENS CAPSULE STUDY N/A 05/18/2020   Procedure: GIVENS CAPSULE STUDY;  Surgeon: Harvel Quale, MD;  [facility];  Service: Gastroenterology;  Laterality: N/A;  . POLYPECTOMY  01/25/2020   Procedure: POLYPECTOMY;  Surgeon: Eloise Harman, DO;  [facility];  Service: Endoscopy;;  . POLYPECTOMY  04/07/2020   Procedure: POLYPECTOMY INTESTINAL;  Surgeon: Eloise Harman, DO;  [facility];  Service: Endoscopy;;  . SKIN CANCER EXCISION     Face  . SPINE SURGERY    . SUBMUCOSAL TATTOO INJECTION  01/25/2020   Procedure: SUBMUCOSAL TATTOO INJECTION;  Surgeon: Eloise Harman, DO;  [facility];  Service: Endoscopy;;   Family History  Problem Relation Age of Onset  .  Diabetes Father   . Heart disease Father 19       MI  . Hypertension Father   . Anemia Mother        Transfusion dependent  . COPD Sister   . Cancer Paternal Grandmother 24       Pancreatic   Social History   Socioeconomic History  . Marital status: Widowed    Spouse name: Not on file  . Number of children: 2  . Years of education: 35  . Highest education level: Not on file  Occupational History  . Occupation: Retired   Tobacco Use  . Smoking status: Former Smoker    Packs/day: 1.50    Years: 40.00    Pack years: 60.00    Types: Cigarettes    Quit date: 04/29/2015    Years since quitting: 5.2  . Smokeless tobacco: Never Used  . Tobacco comment: Quit smoking 04/2015- Previous 1.5 ppd smoker  Vaping Use  . Vaping Use: Never used  Substance and Sexual Activity  . Alcohol use: No    Alcohol/week: 0.0 standard drinks  .  Drug use: No  . Sexual activity: Not Currently    Birth control/protection: Post-menopausal  Other Topics Concern  . Not on file  Social History Narrative   Her 38 year old granddaughter lives with her - one daughter lives nearby   Social Determinants of Health   Financial Resource Strain: [place]   . Difficulty of Paying Living Expenses: Not very hard  Food Insecurity: No Food Insecurity  . Worried About Charity fundraiser in the Last Year: Never true  . Ran Out of Food in the Last Year: Never true  Transportation Needs: No Transportation Needs  . Lack of Transportation (Medical): No  . Lack of Transportation (Non-Medical): No  Physical Activity: Inactive  . Days of Exercise per Week: 0 days  . Minutes of Exercise per Session: 0 min  Stress: No Stress Concern Present  . Feeling of Stress : Not at all  Social Connections: Socially Isolated  . Frequency of Communication with Friends and Family: More than three times a week  . Frequency of Social Gatherings with Friends and Family: Once a week  . Attends Religious Services: Never  . Active Member of Clubs or Organizations: No  . Attends Archivist Meetings: Never  . Marital Status: Widowed    Tobacco Counseling Counseling given: Not Answered Comment: Quit smoking 04/2015- Previous 1.5 ppd smoker   Clinical Intake:  Pre-visit preparation completed: Yes  Pain : No/denies pain Pain Score: 0-No pain     BMI - recorded: 53.04 Nutritional Status: BMI > 30  Obese Nutritional Risks: None Diabetes: Yes CBG done?: No Did pt. bring in CBG monitor from home?: No  How often do you need to have someone help you when you read instructions, pamphlets, or other written materials from your doctor or pharmacy?: 1 - Never  Nutrition Risk Assessment:  Has the patient had any N/V/D within the last 2 months?  No  Does the patient have any non-healing wounds?  No  Has the patient had any unintentional weight loss or weight  gain?  Yes   Diabetes:  Is the patient diabetic?  Yes  If diabetic, was a CBG obtained today?  No  Did the patient bring in their glucometer from home?  No  How often do you monitor your CBG's? Never at home.   Financial Strains and Diabetes Management:  Are you having any  financial strains with the device, your supplies or your medication? No .  Does the patient want to be seen by Chronic Care Management for management of their diabetes?  No  Would the patient like to be referred to a Nutritionist or for Diabetic Management?  No   Diabetic Exams:  Diabetic Eye Exam: Overdue for diabetic eye exam. Pt has been advised about the importance in completing this exam. Diabetic Foot Exam: Completed 08/19/2017. Pt has been advised about the importance in completing this exam. Pt is scheduled for diabetic foot exam on 07/2020.    Interpreter Needed?: No  Information entered by :: Elvie Maines, LPN   Activities of Daily Living In your present state of health, do you have any difficulty performing the following activities: 07/24/2020 05/17/2020  Hearing? Y [place]? Y N  Difficulty concentrating or making decisions? Y N  Walking or climbing stairs? Y N  Dressing or bathing? N N  Doing errands, shopping? N N  Preparing Food and eating ? N -  Using the Toilet? N -  In the past six months, have you accidently leaked urine? N -  Do you have problems with loss of bowel control? N -  Managing your Medications? N -  Managing your Finances? N -  Housekeeping or managing your Housekeeping? N -  Some recent data might be hidden    Patient Care Team: [name], DO as PCP - General (Family Medicine) Harl Bowie Alphonse Guild, MD as PCP - Cardiology (Cardiology) Zonia Kief, MD (Rehabilitation) Chesley Mires, MD as Consulting Physician (Pulmonary Disease) Shea Evans [name], LCSW as [place] Management (Licensed Clinical Social Worker) Ilean China, RN  as Case Manager Eloise Harman, DO as Consulting Physician (Internal Medicine)  Indicate any recent [place] you may have received from other than [facility] providers in the past year (date may be approximate).     Assessment:   This is a routine wellness examination for [name].  Hearing/Vision screen  Hearing Screening   125Hz  250Hz  500Hz  1000Hz  2000Hz  3000Hz  4000Hz  6000Hz  8000Hz   Right ear:           Left ear:           Comments: C/o moderate hearing loss - declines hearing loss  Vision Screening Comments: Cannot afford eyeglasses - wears reading glasses from [employer]  Dietary issues and exercise activities discussed: Current Exercise Habits: The patient does not participate in regular exercise at present, Exercise limited by: orthopedic condition(s);respiratory conditions(s)  Goals    .  Anemia Improved      Timeframe:  Short-Term Goal Priority:  High Start Date:                             Expected End Date:                       Follow-up: 07/14/20  . Keep appt with hematologist on 07/09/20 . Have endoscopy on 07/28/20 . Call GI with any new or worsening symptoms . set a daily activity goal . take a stool softener to prevent constipation . don't eat dairy products, such as milk, cheese and ice cream with those high in iron . eat 3 to 5 servings of fruits and vegetables each day . eat dark, leafy greens, such as spinach, chard and collards . include high-iron food, such as meat, chicken, fish, shellfish,  dried fruit, beans and nuts in each meal . pair iron-rich foods with foods high in vitamin C, such as oranges, strawberries and red peppers to help absorption    .  Blood Pressure < 140/90    .  Chronic Disease Management Needs      CARE PLAN ENTRY (see longtitudinal plan of care for additional care plan information)  Current Barriers:  . Chronic Disease Management support, education, and care coordination needs related to Depression, Obesity, DM, HTN, COPD,  DJD, HLD  Clinical Goal(s) related to Depression, Obesity, DM, HTN, COPD, DJD, HLD:  Over the next Depression, Obesity, DM, HTN, COPD, DJD, HLD days, patient will:  . Work with the care management team to address educational, disease management, and care coordination needs  . Begin or continue self health monitoring activities as directed today Measure and record cbg (blood glucose) 1 times daily and Measure and record blood pressure 3 times per week . Call provider office for new or worsened signs and symptoms Blood glucose findings outside established parameters and Blood pressure findings outside established parameters . Call care management team with questions or concerns . Verbalize basic understanding of patient centered plan of care established today  Interventions related to Depression, Obesity, DM, HTN, COPD, DJD, HLD:  . Evaluation of current treatment plans and patient's adherence to plan as established by provider . Assessed patient understanding of disease states . Assessed patient's education and care coordination needs . Provided disease specific education to patient  . Collaborated with appropriate clinical care team members regarding patient needs . Chart reviewed including recent office notes, telephone notes, referrals, and lab results . Discussed transportation concerns o Provided information on [facility] . Discussed ambulation with walker and ability to perform ADLs . Reviewed and discussed medications . Discussed used of bedside commode . Advised that she needs a visit with Dr Lajuana Ripple. Will have front office staff arrange this . Reviewed and discussed most recent recommendations from pulmonologist, Dr Verlee Monte . Discussed home health and in-home care needs . Discussed use of oxygen. Has portable tanks for traveling outside of the home.  . Encouraged patient to reach out to CCM team as needed  Patient Self Care Activities related to Depression, Obesity, DM, HTN, COPD,  DJD, HLD:  . Patient is unable to independently self-manage chronic health conditions  Initial goal documentation    .  Client will talk with LCSW in next 30 days to discuss depression issues of client and manaement of depression issues (pt-stated)      CARE PLAN ENTRY   Current Barriers:  Patient with chronic diagnoses of Depression, Obesity, DM, HTN, COPD, DJD, HLD  Clinical Social Work Clinical Goal(s):  Marland Kitchen LCSW will call client in next 30 days to discuss depression issues of client and client management of depression issues  Interventions: . Talked with clinet about CCM program support . Talked with client about needs of client . Talked with client about mobility of client . Talked with client about ADLs completion of client (she said she cannot step anymore into bathtub) . Talked with client about meal provision for client . Talked with client about DME (has a walker with a seat, 3 in 1 bedside commode,oxygen system) . Talked with client about breathing issues and oxygen level check for patient . Talked with client about [employer] support . Talked with [name] about financial needs (she is on a set income each month and said she has some financial challenges) . Talked with client about mood  of client . Talked with client about depression issue of client . Talked with client about mobility in home setting (she said her daughter is helping her to rearrange furniture in the home to help client with mobility) . Talked with client about sleeping issues of client . Talked with client about edema issues . Talked with client about pain issues of client . Talked with client about social support network (daughter is supportive, grandchildren are supportive but are in school currently) . Talked with client about her upcoming medical appointments . Provided counseling support for client . Talked with client about Emergency planning/management officer transport service (LCSW gave client phone number for  [facility]) . Collaborated with RNCM regarding nursing needs of client  Patient Self Care Activities:   Eats meals with set up assistance   Patient Self Care Deficits:  Shortness of breath (breathing challenges, uses oxygen as needed) Mobility issues Transport challenges  Initial goal documentation     .  Exercise 150 minutes per week (moderate activity)    .  Weight < 259 lb (117.482 kg)      7% weight loss      Depression Screen PHQ 2/9 Scores 07/24/2020 06/24/2020 12/05/2019 04/17/2019 01/17/2018 12/22/2017 12/19/2017  PHQ - 2 Score 0 0 2 0 3 4 4   PHQ- 9 Score - - 6 0 6 6 6   Exception Documentation - - - - - - -  Not completed - - - - - - -    Fall Risk Fall Risk  06/24/2020 04/17/2019 01/17/2018 12/19/2017 09/18/2017  Falls in the past year? 0 0 No No No    FALL RISK PREVENTION PERTAINING TO THE HOME:  Any stairs in or around the home? Yes  If so, are there any without handrails? Yes  Home free of loose throw rugs in walkways, pet beds, electrical cords, etc? Yes  Adequate lighting in your home to reduce risk of falls? Yes   ASSISTIVE DEVICES UTILIZED TO PREVENT FALLS:  Life alert? No  Use of a cane, walker or w/c? Yes  Grab bars in the bathroom? Yes  Shower chair or bench in shower? Yes  Elevated toilet seat or a handicapped toilet? Yes   TIMED UP AND GO:  Was the test performed? No .  Telephonic visit.  Cognitive Function:        Immunizations Immunization History  Administered Date(s) Administered  . Hepatitis B 05/30/1991, 06/27/1991, 12/04/1991  . Influenza Whole 01/04/2008, 03/24/2009  . Influenza, High Dose Seasonal PF 01/17/2018, 12/29/2018  . Influenza,inj,Quad PF,6+ Mos 11/27/2014  . Influenza-Unspecified 01/26/2013, 03/01/2014  . Pneumococcal Conjugate-13 03/01/2014  . Pneumococcal Polysaccharide-23 09/08/2015  . Td 05/27/2002  . Zoster 02/14/2013    TDAP status: Due, Education has been provided regarding the importance of this vaccine.  Advised may receive this vaccine at local pharmacy or Health Dept. Aware to provide a copy of the vaccination record if obtained from local pharmacy or Health Dept. Verbalized acceptance and understanding.  Flu Vaccine status: Due, Education has been provided regarding the importance of this vaccine. Advised may receive this vaccine at local pharmacy or Health Dept. Aware to provide a copy of the vaccination record if obtained from local pharmacy or Health Dept. Verbalized acceptance and understanding.  Pneumococcal vaccine status: Up to date  Covid-19 vaccine status: Declined, Education has been provided regarding the importance of this vaccine but patient still declined. Advised may receive this vaccine at local pharmacy or Health Dept.or vaccine clinic. Aware to provide a copy  of the vaccination record if obtained from local pharmacy or Health Dept. Verbalized acceptance and understanding.  Qualifies for Shingles Vaccine? Yes   Zostavax completed Yes   Shingrix Completed?: No.    Education has been provided regarding the importance of this vaccine. Patient has been advised to call insurance company to determine out of pocket expense if they have not yet received this vaccine. Advised may also receive vaccine at local pharmacy or Health Dept. Verbalized acceptance and understanding.  Screening Tests Health Maintenance  Topic Date Due  . COVID-19 Vaccine (1) Never done  . MAMMOGRAM  Never done  . TETANUS/TDAP  05/26/2012  . DEXA SCAN  Never done  . FOOT EXAM  05/22/2018  . OPHTHALMOLOGY EXAM  08/25/2018  . HEMOGLOBIN A1C  07/22/2020  . INFLUENZA VACCINE  10/26/2020  . COLONOSCOPY (Pts 45-35yr Insurance coverage will need to be confirmed)  04/07/2030  . Hepatitis C Screening  Completed  . PNA vac Low Risk Adult  Completed  . HPV VACCINES  Aged Out    Health Maintenance  Health Maintenance Due  Topic Date Due  . COVID-19 Vaccine (1) Never done  . MAMMOGRAM  Never done  .  TETANUS/TDAP  05/26/2012  . DEXA SCAN  Never done  . FOOT EXAM  05/22/2018  . OPHTHALMOLOGY EXAM  08/25/2018  . HEMOGLOBIN A1C  07/22/2020    Colorectal cancer screening: Type of screening: Colonoscopy. Completed 04/07/2020. Repeat every 10 years  Mammogram status: No longer required due to patient declines.  Bone density screening: likely done at oncology/hematology office - she will check and bring results if so  Lung Cancer Screening: (Low Dose CT Chest recommended if Age 71-80years, 30 pack-year currently smoking OR have quit w/in 15years.) does not qualify.   Additional Screening:  Hepatitis C Screening: does qualify; Completed 09/08/2015  Vision Screening: Recommended annual ophthalmology exams for early detection of glaucoma and other disorders of the eye. Is the patient up to date with their annual eye exam?  No  Who is the provider or what is the name of the office in which the patient attends annual eye exams? No eye dr If pt is not established with a provider, would they like to be referred to a provider to establish care? No .   Dental Screening: Recommended annual dental exams for proper oral hygiene  Community Resource Referral / Chronic Care Management: CRR required this visit?  No   CCM required this visit?  No      Plan:     I have personally reviewed and noted the following in the patient's chart:   . Medical and social history . Use of alcohol, tobacco or illicit drugs  . Current medications and supplements . Functional ability and status . Nutritional status . Physical activity . Advanced directives . List of other physicians . Hospitalizations, surgeries, and ER visits in previous 12 months . Vitals . Screenings to include cognitive, depression, and falls . Referrals and appointments  In addition, I have reviewed and discussed with patient certain preventive protocols, quality metrics, and best practice recommendations. A written personalized  care plan for preventive services as well as general preventive health recommendations were provided to patient.     ASandrea Hammond LPN   [id number]  Nurse Notes: None

## 2020-07-27 ENCOUNTER — Encounter (HOSPITAL_COMMUNITY): Payer: Self-pay

## 2020-07-27 ENCOUNTER — Encounter (HOSPITAL_COMMUNITY)
Admission: RE | Admit: 2020-07-27 | Discharge: 2020-07-27 | Disposition: A | Discharge status: Home / Self Care | Payer: Medicare PPO | Source: Ambulatory Visit | Attending: Internal Medicine | Admitting: Internal Medicine

## 2020-07-27 ENCOUNTER — Other Ambulatory Visit (HOSPITAL_COMMUNITY)
Admission: RE | Admit: 2020-07-27 | Discharge: 2020-07-27 | Disposition: A | Discharge status: Home / Self Care | Payer: Medicare PPO | Source: Ambulatory Visit | Attending: Internal Medicine | Admitting: Internal Medicine

## 2020-07-27 ENCOUNTER — Other Ambulatory Visit: Payer: Self-pay

## 2020-07-27 DIAGNOSIS — Z01812 Encounter for preprocedural laboratory examination: Secondary | ICD-10-CM | POA: Diagnosis not present

## 2020-07-27 DIAGNOSIS — Z20822 Contact with and (suspected) exposure to covid-19: Secondary | ICD-10-CM | POA: Insufficient documentation

## 2020-07-27 LAB — CBC WITH DIFFERENTIAL/PLATELET
Abs Immature Granulocytes: 0.02 10*3/uL (ref 0.00–0.07)
Basophils Absolute: 0 10*3/uL (ref 0.0–0.1)
Basophils Relative: 0 %
Eosinophils Absolute: 0.1 10*3/uL (ref 0.0–0.5)
Eosinophils Relative: 1 %
HCT: 26.9 % — ABNORMAL LOW (ref 36.0–46.0)
Hemoglobin: 7.5 g/dL — ABNORMAL LOW (ref 12.0–15.0)
Immature Granulocytes: 0 %
Lymphocytes Relative: 25 %
Lymphs Abs: 1.3 10*3/uL (ref 0.7–4.0)
MCH: 27.9 pg (ref 26.0–34.0)
MCHC: 27.9 g/dL — ABNORMAL LOW (ref 30.0–36.0)
MCV: 100 fL (ref 80.0–100.0)
Monocytes Absolute: 0.4 10*3/uL (ref 0.1–1.0)
Monocytes Relative: 8 %
Neutro Abs: 3.3 10*3/uL (ref 1.7–7.7)
Neutrophils Relative %: 66 %
Platelets: 120 10*3/uL — ABNORMAL LOW (ref 150–400)
RBC: 2.69 MIL/uL — ABNORMAL LOW (ref 3.87–5.11)
RDW: 26.9 % — ABNORMAL HIGH (ref 11.5–15.5)
WBC: 5.1 10*3/uL (ref 4.0–10.5)
nRBC: 0 % (ref 0.0–0.2)

## 2020-07-27 LAB — SARS CORONAVIRUS 2 (TAT 6-24 HRS): SARS Coronavirus 2: NEGATIVE

## 2020-07-28 ENCOUNTER — Ambulatory Visit (HOSPITAL_COMMUNITY)
Admission: RE | Admit: 2020-07-28 | Discharge: 2020-07-28 | Disposition: A | Discharge status: Home / Self Care | Payer: Medicare PPO | Attending: Internal Medicine | Admitting: Internal Medicine

## 2020-07-28 ENCOUNTER — Ambulatory Visit (HOSPITAL_COMMUNITY): Payer: Medicare PPO | Admitting: Anesthesiology

## 2020-07-28 ENCOUNTER — Encounter (HOSPITAL_COMMUNITY)
Admission: RE | Disposition: A | Discharge status: Home / Self Care | Payer: Self-pay | Source: Home / Self Care | Attending: Internal Medicine

## 2020-07-28 ENCOUNTER — Encounter (HOSPITAL_COMMUNITY): Payer: Self-pay

## 2020-07-28 DIAGNOSIS — Z881 Allergy status to other antibiotic agents status: Secondary | ICD-10-CM | POA: Insufficient documentation

## 2020-07-28 DIAGNOSIS — Z79899 Other long term (current) drug therapy: Secondary | ICD-10-CM | POA: Insufficient documentation

## 2020-07-28 DIAGNOSIS — Z8249 Family history of ischemic heart disease and other diseases of the circulatory system: Secondary | ICD-10-CM | POA: Diagnosis not present

## 2020-07-28 DIAGNOSIS — D509 Iron deficiency anemia, unspecified: Secondary | ICD-10-CM | POA: Diagnosis not present

## 2020-07-28 DIAGNOSIS — D500 Iron deficiency anemia secondary to blood loss (chronic): Secondary | ICD-10-CM | POA: Diagnosis not present

## 2020-07-28 DIAGNOSIS — Z800 Family history of malignant neoplasm of digestive organs: Secondary | ICD-10-CM | POA: Insufficient documentation

## 2020-07-28 DIAGNOSIS — Z85828 Personal history of other malignant neoplasm of skin: Secondary | ICD-10-CM | POA: Insufficient documentation

## 2020-07-28 DIAGNOSIS — I8510 Secondary esophageal varices without bleeding: Secondary | ICD-10-CM | POA: Diagnosis not present

## 2020-07-28 DIAGNOSIS — R531 Weakness: Secondary | ICD-10-CM | POA: Insufficient documentation

## 2020-07-28 DIAGNOSIS — Z86010 Personal history of colonic polyps: Secondary | ICD-10-CM | POA: Diagnosis not present

## 2020-07-28 DIAGNOSIS — Z7984 Long term (current) use of oral hypoglycemic drugs: Secondary | ICD-10-CM | POA: Insufficient documentation

## 2020-07-28 DIAGNOSIS — K7460 Unspecified cirrhosis of liver: Secondary | ICD-10-CM | POA: Diagnosis not present

## 2020-07-28 DIAGNOSIS — Z8719 Personal history of other diseases of the digestive system: Secondary | ICD-10-CM | POA: Insufficient documentation

## 2020-07-28 DIAGNOSIS — R5383 Other fatigue: Secondary | ICD-10-CM | POA: Diagnosis not present

## 2020-07-28 DIAGNOSIS — Z87891 Personal history of nicotine dependence: Secondary | ICD-10-CM | POA: Insufficient documentation

## 2020-07-28 DIAGNOSIS — I10 Essential (primary) hypertension: Secondary | ICD-10-CM | POA: Insufficient documentation

## 2020-07-28 DIAGNOSIS — K2970 Gastritis, unspecified, without bleeding: Secondary | ICD-10-CM | POA: Insufficient documentation

## 2020-07-28 DIAGNOSIS — Z833 Family history of diabetes mellitus: Secondary | ICD-10-CM | POA: Diagnosis not present

## 2020-07-28 DIAGNOSIS — Z832 Family history of diseases of the blood and blood-forming organs and certain disorders involving the immune mechanism: Secondary | ICD-10-CM | POA: Diagnosis not present

## 2020-07-28 DIAGNOSIS — J449 Chronic obstructive pulmonary disease, unspecified: Secondary | ICD-10-CM | POA: Diagnosis not present

## 2020-07-28 DIAGNOSIS — E119 Type 2 diabetes mellitus without complications: Secondary | ICD-10-CM | POA: Diagnosis not present

## 2020-07-28 DIAGNOSIS — E785 Hyperlipidemia, unspecified: Secondary | ICD-10-CM | POA: Diagnosis not present

## 2020-07-28 HISTORY — PX: ESOPHAGOGASTRODUODENOSCOPY (EGD) WITH PROPOFOL: SHX5813

## 2020-07-28 HISTORY — PX: GIVENS CAPSULE STUDY: SHX5432

## 2020-07-28 LAB — TYPE AND SCREEN
ABO/RH(D): A POS
Antibody Screen: NEGATIVE

## 2020-07-28 LAB — GLUCOSE, CAPILLARY: Glucose-Capillary: 99 mg/dL (ref 70–99)

## 2020-07-28 SURGERY — ESOPHAGOGASTRODUODENOSCOPY (EGD) WITH PROPOFOL
Anesthesia: General

## 2020-07-28 MED ORDER — PROPOFOL 10 MG/ML IV BOLUS
INTRAVENOUS | Status: DC | PRN
  Administered 2020-07-28: 20 mg via INTRAVENOUS
  Administered 2020-07-28: 120 mg via INTRAVENOUS
  Administered 2020-07-28: 30 mg via INTRAVENOUS

## 2020-07-28 MED ORDER — LACTATED RINGERS IV SOLN: INTRAVENOUS | Status: DC

## 2020-07-28 MED ORDER — STERILE WATER FOR IRRIGATION IR SOLN
Status: DC | PRN
  Administered 2020-07-28: 1.5 mL

## 2020-07-28 MED ORDER — LIDOCAINE HCL (CARDIAC) PF 100 MG/5ML IV SOSY
PREFILLED_SYRINGE | INTRAVENOUS | Status: DC | PRN
  Administered 2020-07-28: 80 mg via INTRAVENOUS

## 2020-07-28 NOTE — Transfer of Care (Signed)
Immediate Anesthesia Transfer of Care Note  Patient: Savannah Burns  Procedure(s) Performed: ESOPHAGOGASTRODUODENOSCOPY (EGD) WITH PROPOFOL (N/A ) GIVENS CAPSULE STUDY (N/A )  Patient Location: PACU  Anesthesia Type:General  Level of Consciousness: awake, alert  and oriented  Airway & Oxygen Therapy: Patient Spontanous Breathing and Patient connected to nasal cannula oxygen  Post-op Assessment: Report given to RN and Post -op Vital signs reviewed and stable  Post vital signs: Reviewed and stable  Last Vitals:  Vitals Value Taken Time  BP    Temp    Pulse    Resp    SpO2      Last Pain:  Vitals:   07/28/20 0847  TempSrc:   PainSc: 0-No pain      Patients Stated Pain Goal: 5 (97/94/80 1655)  Complications: No complications documented. Pt currently on 3L O2, home O2 requirements.

## 2020-07-28 NOTE — Anesthesia Postprocedure Evaluation (Signed)
Anesthesia Post Note  Patient: Savannah Burns  Procedure(s) Performed: ESOPHAGOGASTRODUODENOSCOPY (EGD) WITH PROPOFOL (N/A ) GIVENS CAPSULE STUDY (N/A )  Patient location during evaluation: Phase II Anesthesia Type: General Level of consciousness: awake and alert and oriented Pain management: pain level controlled Vital Signs Assessment: post-procedure vital signs reviewed and stable Respiratory status: spontaneous breathing and respiratory function stable Cardiovascular status: blood pressure returned to baseline and stable Postop Assessment: no apparent nausea or vomiting Anesthetic complications: no   No complications documented.   Last Vitals:  Vitals:   07/28/20 0755 07/28/20 0901  BP: (!) 144/62   Pulse: 73 84  Resp: 16 16  Temp: 36.7 C 37.1 C  SpO2: 100% 97%    Last Pain:  Vitals:   07/28/20 0909  TempSrc:   PainSc: 0-No pain                 [name] C Kamaiyah Uselton

## 2020-07-28 NOTE — Discharge Instructions (Addendum)
EGD Discharge instructions Please read the instructions outlined below and refer to this sheet in the next few weeks. These discharge instructions provide you with general information on caring for yourself after you leave the hospital. Your doctor may also give you specific instructions. While your treatment has been planned according to the most current medical practices available, unavoidable complications occasionally occur. If you have any problems or questions after discharge, please call your doctor. ACTIVITY  You may resume your regular activity but move at a slower pace for the next 24 hours.   Take frequent rest periods for the next 24 hours.   Walking will help expel (get rid of) the air and reduce the bloated feeling in your abdomen.   No driving for 24 hours (because of the anesthesia (medicine) used during the test).   You may shower.   Do not sign any important legal documents or operate any machinery for 24 hours (because of the anesthesia used during the test).  NUTRITION  Drink plenty of fluids.   You may resume your normal diet.   Begin with a light meal and progress to your normal diet.   Avoid alcoholic beverages for 24 hours or as instructed by your caregiver.  MEDICATIONS  You may resume your normal medications unless your caregiver tells you otherwise.  WHAT YOU CAN EXPECT TODAY  You may experience abdominal discomfort such as a feeling of fullness or "gas" pains.  FOLLOW-UP  Your doctor will discuss the results of your test with you.  SEEK IMMEDIATE MEDICAL ATTENTION IF ANY OF THE FOLLOWING OCCUR:  Excessive nausea (feeling sick to your stomach) and/or vomiting.   Severe abdominal pain and distention (swelling).   Trouble swallowing.   Temperature over 101 F (37.8 C).   Rectal bleeding or vomiting of blood.   Your EGD revealed a few large blood vessels in the back of your throat which may be contributing to your anemia.  I will send you to ENT  to have this further evaluated.  I did successfully deploy the capsule into your small bowel and we should have these results back by next week.  We will contact you.  Further recommendations to follow.  I hope you have a great rest of your week!  Elon Alas. Abbey Chatters, D.O. Gastroenterology and Hepatology [facility]  Please follow the instructions given to you on the Givens Device.

## 2020-07-28 NOTE — Anesthesia Preprocedure Evaluation (Signed)
Anesthesia Evaluation  Patient identified by MRN, date of birth, ID band Patient awake    Reviewed: Allergy & Precautions, NPO status , Patient's Chart, lab work & pertinent test results  History of Anesthesia Complications Negative for: history of anesthetic complications  Airway Mallampati: III  TM Distance: >3 FB Neck ROM: Full    Dental  (+) Dental Advisory Given, Upper Dentures, Missing   Pulmonary shortness of breath, with exertion and Long-Term Oxygen Therapy, pneumonia, resolved, COPD,  oxygen dependent, former smoker,    Pulmonary exam normal breath sounds clear to auscultation       Cardiovascular Exercise Tolerance: Poor hypertension, Pt. on medications +CHF  Normal cardiovascular exam Rhythm:Regular Rate:Normal  Echo shows some mild stiffness to the heart which is common with aging but can cause some swelling/fluid to build up. One of her heart valves (aortic valve) is mildly thicker than normal but overall working well, this creates her heart murmur and is not considered to be a significant issue. Will discuss in more detail at f/u   Neuro/Psych PSYCHIATRIC DISORDERS Anxiety Depression    GI/Hepatic GERD  Medicated,(+) Cirrhosis   Esophageal Varices    ,   Endo/Other  diabetes, Well Controlled, Type 2, Oral Hypoglycemic AgentsMorbid obesity  Renal/GU      Musculoskeletal  (+) Arthritis ,   Abdominal   Peds  Hematology  (+) anemia ,   Anesthesia Other Findings   Reproductive/Obstetrics                             Anesthesia Physical  Anesthesia Plan  ASA: IV  Anesthesia Plan: General   Post-op Pain Management:    Induction: Intravenous  PONV Risk Score and Plan: TIVA  Airway Management Planned: Nasal Cannula, Natural Airway and Simple Face Mask  Additional Equipment:   Intra-op Plan:   Post-operative Plan:   Informed Consent: I have reviewed the patients  History and Physical, chart, labs and discussed the procedure including the risks, benefits and alternatives for the proposed anesthesia with the patient or authorized representative who has indicated his/her understanding and acceptance.     Dental advisory given  Plan Discussed with: CRNA and Surgeon  Anesthesia Plan Comments:         Anesthesia Quick Evaluation

## 2020-07-28 NOTE — Op Note (Signed)
[facility] Patient Name: Savannah Burns Procedure Date: 07/28/2020 8:34 AM MRN: [id number] Date of Birth: 1951/02/20 Attending MD: Elon Alas. Abbey Chatters DO CSN: [id number] Age: 70 Admit Type: Outpatient Procedure:                Upper GI endoscopy Indications:              Iron deficiency anemia Providers:                Elon Alas. Abbey Chatters, DO, Charlsie Quest. Theda Sers RN, RN,                            Aram Candela Referring MD:              Medicines:                See the Anesthesia note for documentation of the                            administered medications Complications:            No immediate complications. Estimated Blood Loss:     Estimated blood loss: none. Procedure:                Pre-Anesthesia Assessment:                           - The anesthesia plan was to use monitored                            anesthesia care (MAC).                           After obtaining informed consent, the endoscope was                            passed under direct vision. Throughout the                            procedure, the patient's blood pressure, pulse, and                            oxygen saturations were monitored continuously. The                            [id number]-H190 ([id number]) scope was introduced through the                            mouth, and advanced to the second part of duodenum.                            The upper GI endoscopy was accomplished without                            difficulty. The patient tolerated the procedure                            well. Scope In: 8:49:01 AM  Scope Out: 8:56:49 AM Total Procedure Duration: 0 hours 7 minutes 48 seconds  Findings:      Prominent blood vessel found at base of tongue and underneath tongue.       ?Lingual varicosities, No active bleeding      Grade I varices were found in the lower third of the esophagus. These       flattened with insuflation.      Localized mild inflammation characterized by erythema was found in  the       gastric antrum.      The duodenal bulb, first portion of the duodenum and second portion of       the duodenum were normal. Using the endoscope, the GIVENS capsule was       deployed into the first portion of the duodenum. Few mucosal abrasions       caused by advancing capsule into dudoenum. Impression:               - Prominent blood vessel found base of tongue and                            underneath tongue.                           - Grade I esophageal varices.                           - Gastritis.                           - Normal duodenal bulb, first portion of the                            duodenum and second portion of the duodenum.                           - Successful completion of the Video Capsule                            Enteroscope placement.                           - No specimens collected. Moderate Sedation:      Per Anesthesia Care Recommendation:           - Patient has a contact number available for                            emergencies. The signs and symptoms of potential                            delayed complications were discussed with the                            patient. Return to normal activities tomorrow.                            Written discharge instructions were provided to the  patient.                           - Resume previous diet.                           - Continue present medications.                           - Await results of capsule endoscopy.                           - May need referral to ENT.                           - Return to GI clinic in 3 months. Procedure Code(s):        --- Professional ---                           [phone], Esophagogastroduodenoscopy, flexible,                            transoral; diagnostic, including collection of                            specimen(s) by brushing or washing, when performed                            (separate procedure) Diagnosis Code(s):         --- Professional ---                           I85.00, Esophageal varices without bleeding                           K29.70, Gastritis, unspecified, without bleeding                           D50.9, Iron deficiency anemia, unspecified CPT copyright 2019 [employer]. All rights reserved. The codes documented in this report are preliminary and upon coder review may  be revised to meet current compliance requirements. Elon Alas. Abbey Chatters, DO Scott Abbey Chatters, DO 07/28/2020 10:20:38 AM This report has been signed electronically. Number of Addenda: 0

## 2020-07-28 NOTE — Interval H&P Note (Signed)
History and Physical Interval Note:  07/28/2020 8:33 AM  [employer]  has presented today for surgery, with the diagnosis of gi BLEED, MELENA.  The various methods of treatment have been discussed with the patient and family. After consideration of risks, benefits and other options for treatment, the patient has consented to  Procedure(s) with comments: ESOPHAGOGASTRODUODENOSCOPY (EGD) WITH PROPOFOL (N/A) - am or early PM due to givens capsule placement GIVENS CAPSULE STUDY (N/A) as a surgical intervention.  The patient's history has been reviewed, patient examined, no change in status, stable for surgery.  I have reviewed the patient's chart and labs.  Questions were answered to the patient's satisfaction.     Savannah Burns

## 2020-07-29 ENCOUNTER — Telehealth: Payer: Self-pay | Admitting: *Deleted

## 2020-07-29 ENCOUNTER — Telehealth: Payer: Self-pay | Admitting: Internal Medicine

## 2020-07-29 DIAGNOSIS — R198 Other specified symptoms and signs involving the digestive system and abdomen: Secondary | ICD-10-CM

## 2020-07-29 DIAGNOSIS — D509 Iron deficiency anemia, unspecified: Secondary | ICD-10-CM

## 2020-07-29 NOTE — Telephone Encounter (Signed)
Received call from endo. Pt had givens placed yesterday during procedure. She has not returned recorder and endo unable to reach pt.  I called pt also and LMOVM to call endo also. FYI to Vicente Males as she is scheduled to read givens

## 2020-07-29 NOTE — Telephone Encounter (Signed)
Can we please refer this patient to to ENT for abnormal findings in her oropharynx on upper endoscopy, ?Lingual varicosities as well as anemia.  Thank you

## 2020-07-29 NOTE — Telephone Encounter (Signed)
Spoke with Melanie in endo and patient just showed up.

## 2020-07-29 NOTE — Telephone Encounter (Signed)
Referral placed to ENT Dr. Benjamine Mola

## 2020-07-29 NOTE — Addendum Note (Signed)
Addended by: Cheron Every on: 07/29/2020 10:19 AM   Modules accepted: Orders

## 2020-07-30 ENCOUNTER — Inpatient Hospital Stay (HOSPITAL_COMMUNITY): Payer: Medicare PPO | Attending: Hematology

## 2020-07-30 ENCOUNTER — Telehealth: Payer: Self-pay | Admitting: Gastroenterology

## 2020-07-30 ENCOUNTER — Other Ambulatory Visit: Payer: Self-pay

## 2020-07-30 DIAGNOSIS — E118 Type 2 diabetes mellitus with unspecified complications: Secondary | ICD-10-CM | POA: Diagnosis not present

## 2020-07-30 DIAGNOSIS — R0602 Shortness of breath: Secondary | ICD-10-CM | POA: Insufficient documentation

## 2020-07-30 DIAGNOSIS — Z87442 Personal history of urinary calculi: Secondary | ICD-10-CM | POA: Insufficient documentation

## 2020-07-30 DIAGNOSIS — D3502 Benign neoplasm of left adrenal gland: Secondary | ICD-10-CM | POA: Diagnosis not present

## 2020-07-30 DIAGNOSIS — D696 Thrombocytopenia, unspecified: Secondary | ICD-10-CM | POA: Insufficient documentation

## 2020-07-30 DIAGNOSIS — Z87891 Personal history of nicotine dependence: Secondary | ICD-10-CM | POA: Diagnosis not present

## 2020-07-30 DIAGNOSIS — D500 Iron deficiency anemia secondary to blood loss (chronic): Secondary | ICD-10-CM

## 2020-07-30 DIAGNOSIS — E538 Deficiency of other specified B group vitamins: Secondary | ICD-10-CM | POA: Diagnosis not present

## 2020-07-30 DIAGNOSIS — I509 Heart failure, unspecified: Secondary | ICD-10-CM | POA: Diagnosis not present

## 2020-07-30 DIAGNOSIS — D509 Iron deficiency anemia, unspecified: Secondary | ICD-10-CM | POA: Insufficient documentation

## 2020-07-30 DIAGNOSIS — D7589 Other specified diseases of blood and blood-forming organs: Secondary | ICD-10-CM | POA: Insufficient documentation

## 2020-07-30 DIAGNOSIS — Z79899 Other long term (current) drug therapy: Secondary | ICD-10-CM | POA: Insufficient documentation

## 2020-07-30 DIAGNOSIS — I10 Essential (primary) hypertension: Secondary | ICD-10-CM | POA: Insufficient documentation

## 2020-07-30 DIAGNOSIS — D472 Monoclonal gammopathy: Secondary | ICD-10-CM | POA: Insufficient documentation

## 2020-07-30 LAB — CBC WITH DIFFERENTIAL/PLATELET
Abs Immature Granulocytes: 0 10*3/uL (ref 0.00–0.07)
Basophils Absolute: 0 10*3/uL (ref 0.0–0.1)
Basophils Relative: 0 %
Eosinophils Absolute: 0 10*3/uL (ref 0.0–0.5)
Eosinophils Relative: 1 %
HCT: 28.2 % — ABNORMAL LOW (ref 36.0–46.0)
Hemoglobin: 7.7 g/dL — ABNORMAL LOW (ref 12.0–15.0)
Immature Granulocytes: 0 %
Lymphocytes Relative: 36 %
Lymphs Abs: 1.2 10*3/uL (ref 0.7–4.0)
MCH: 27.6 pg (ref 26.0–34.0)
MCHC: 27.3 g/dL — ABNORMAL LOW (ref 30.0–36.0)
MCV: 101.1 fL — ABNORMAL HIGH (ref 80.0–100.0)
Monocytes Absolute: 0.4 10*3/uL (ref 0.1–1.0)
Monocytes Relative: 11 %
Neutro Abs: 1.7 10*3/uL (ref 1.7–7.7)
Neutrophils Relative %: 52 %
Platelets: 121 10*3/uL — ABNORMAL LOW (ref 150–400)
RBC: 2.79 MIL/uL — ABNORMAL LOW (ref 3.87–5.11)
RDW: 25.1 % — ABNORMAL HIGH (ref 11.5–15.5)
WBC: 3.3 10*3/uL — ABNORMAL LOW (ref 4.0–10.5)
nRBC: 0 % (ref 0.0–0.2)

## 2020-07-30 LAB — SAMPLE TO BLOOD BANK

## 2020-07-30 NOTE — Telephone Encounter (Signed)
Capsule study complete, with full report to follow.  The capsule was complete to the cecum. I have several questions for her.  1. Was she taking iron at the time of the procedure? Some dark effluent towards end of study, and it is unclear if this is old blood or not. If taking iron, could possibly be explained by this?  2. Has she seen any dark, black, sticky stools?   No obvious source for IDA seen on capsule study. No obvious lesions, erosions, or overt GI bleeding. Will have to discuss with Dr. Abbey Chatters next best steps.   Full report to follow.

## 2020-07-31 ENCOUNTER — Encounter: Payer: Self-pay | Admitting: *Deleted

## 2020-07-31 ENCOUNTER — Ambulatory Visit (INDEPENDENT_AMBULATORY_CARE_PROVIDER_SITE_OTHER): Payer: Medicare PPO | Admitting: *Deleted

## 2020-07-31 ENCOUNTER — Other Ambulatory Visit (HOSPITAL_COMMUNITY): Payer: Self-pay | Admitting: Physician Assistant

## 2020-07-31 DIAGNOSIS — F339 Major depressive disorder, recurrent, unspecified: Secondary | ICD-10-CM

## 2020-07-31 DIAGNOSIS — D472 Monoclonal gammopathy: Secondary | ICD-10-CM

## 2020-07-31 DIAGNOSIS — D500 Iron deficiency anemia secondary to blood loss (chronic): Secondary | ICD-10-CM

## 2020-07-31 NOTE — Telephone Encounter (Signed)
Phoned and spoke with the pt and advised of the report. The pt does take iron infusions and iron pills but the day before and the day of the capsule study she did not do either. She has not seen any dark, black  or sticky stools. I advised her once you consult with Dr. Abbey Chatters we will follow up with her. Pt stressed understanding of this.

## 2020-07-31 NOTE — Chronic Care Management (AMB) (Signed)
Chronic Care Management   CCM RN Visit Note  07/31/2020 Name: Savannah Burns MRN: [id number] DOB: 1951-02-02  Subjective: Savannah Burns is a 70 y.o. year old female who is a primary care patient of [name], DO. The care management team was consulted for assistance with disease management and care coordination needs.    Engaged with patient by telephone for follow up visit in response to provider referral for case management and/or care coordination services.   Consent to Services:  The patient was given information about Chronic Care Management services, agreed to services, and gave verbal consent prior to initiation of services.  Please see initial visit note for detailed documentation.   Patient agreed to services and verbal consent obtained.   Assessment: Review of patient past medical history, allergies, medications, health status, including review of consultants reports, laboratory and other test data, was performed as part of comprehensive evaluation and provision of chronic care management services.   SDOH (Social Determinants of Health) assessments and interventions performed:    CCM Care Plan  Allergies  Allergen Reactions  . Keflex [Cephalexin] Nausea And Vomiting    Outpatient Encounter Medications as of 07/31/2020  Medication Sig  . Blood Glucose Calibration (GLUCOMETER DEX HIGH CONTROL) LIQD Test CBG's once daily.   . Blood Glucose Monitoring Suppl (TRUE METRIX AIR GLUCOSE METER) w/Device KIT Check BS daily Dx E11.9  . Cholecalciferol (VITAMIN D) 50 MCG (2000 UT) tablet Take 4,000 Units by mouth daily.  . ferrous sulfate 325 (65 FE) MG tablet Take 1 tablet (325 mg total) by mouth 2 (two) times daily with a meal. (Patient taking differently: Take 325 mg by mouth daily with breakfast.)  . glucose blood (TRUE METRIX BLOOD GLUCOSE TEST) test strip Check BS daily Dx E11.9  . lisinopril (ZESTRIL) 10 MG tablet Take 1 tablet by mouth once daily (Patient taking  differently: Take 10 mg by mouth daily.)  . lovastatin (MEVACOR) 20 MG tablet Take 1 tablet (20 mg total) by mouth at bedtime.  . metFORMIN (GLUCOPHAGE) 1000 MG tablet Take 1 tablet (1,000 mg total) by mouth 2 (two) times daily with a meal.  . nadolol (CORGARD) 20 MG tablet Take 1 tablet (20 mg total) by mouth daily.  Marland Kitchen oxyCODONE-acetaminophen (PERCOCET) 10-325 MG tablet Take 1 tablet by mouth every 6 (six) hours as needed for pain.  . OXYGEN Inhale 3 L into the lungs continuous.  . pantoprazole (PROTONIX) 40 MG tablet Take 1 tablet (40 mg total) by mouth daily.  . potassium chloride SA (KLOR-CON) 20 MEQ tablet Take 2 tablets (40 mEq total) by mouth 2 (two) times daily. (Patient taking differently: Take 40 mEq by mouth daily.)  . sodium chloride (OCEAN) 0.65 % SOLN nasal spray Place 1 spray into both nostrils as needed for congestion.  . torsemide (DEMADEX) 20 MG tablet Take 3 tablets (60 mg total) by mouth 2 (two) times daily. (Patient taking differently: Take 50 mg by mouth 2 (two) times daily.)  . TRUEplus Lancets 30G MISC Check BS daily Dx E11.9  . vitamin B-12 (CYANOCOBALAMIN) 1000 MCG tablet Take 2,000 mcg by mouth daily.  . [DISCONTINUED] albuterol (PROVENTIL HFA;VENTOLIN HFA) 108 (90 Base) MCG/ACT inhaler Inhale 2 puffs into the lungs every 6 (six) hours as needed for wheezing or shortness of breath.   No facility-administered encounter medications on file as of 07/31/2020.    Patient Active Problem List   Diagnosis Date Noted  . Hypoalbuminemia 05/17/2020  . Moderate protein malnutrition ([place])  05/17/2020  . Melena 05/16/2020  . Hyperlipidemia   . Hypokalemia   . GI bleed 03/20/2020  . Lower GI bleed 03/19/2020  . Acute on chronic congestive heart failure ([place])   . Symptomatic anemia   . Iron deficiency anemia   . Heme positive stool   . ABLA (acute blood loss anemia) 01/22/2020  . Supplemental oxygen dependent 10/22/2019  . Chronic dyspnea 10/22/2019  . Hypertension associated  with diabetes ([place]) 10/22/2019  . Bilateral hand pain 09/21/2016  . Fatigue 07/30/2015  . Chronic diastolic HF (heart failure) ([place]) 06/29/2015  . Postmenopausal bleeding 04/17/2015  . Excessive daytime sleepiness 12/19/2014  . Swelling of lower extremity 11/27/2014  . Back pain 06/13/2013  . Sinusitis, chronic 05/30/2012  . Allergic rhinitis 06/06/2011  . Hyperlipidemia associated with type 2 diabetes mellitus ([place]) 03/24/2009  . Diabetes mellitus, type 2 ([place]) 08/12/2008  . Pulmonary nodule 08/29/2007  . COPD (chronic obstructive pulmonary disease) ([place]) 02/14/2007  . Class 3 obesity 05/25/2006  . TOBACCO DEPENDENCE 05/25/2006  . Depression, recurrent ([place]) 05/25/2006  . HYPERTENSION, BENIGN SYSTEMIC 05/25/2006  . DJD, UNSPECIFIED 05/25/2006    Conditions to be addressed/monitored: Depression, Obesity, DM, HTN, COPD, DJD, HLD, CHF, iron deficiency anemia, hepatic cirrhosis   Care Plan : Iron Deficiency (Adult)  Updates made by Ilean China, RN since 07/31/2020 12:00 AM    Problem: Iron Deficiency   Priority: High  Note:   Iron deficiency anemia in a patient with depression, Obesity, DM, HTN, COPD, DJD, HLD, CHF    Goal: Anemia Management   Start Date: 02/28/2020  This Visit's Progress: On track  Recent Progress: Not on track  Priority: High  Note:   Objective: Lab Results  Component Value Date   WBC 3.3 (L) 07/30/2020   HGB 7.7 (L) 07/30/2020   HCT 28.2 (L) 07/30/2020   MCV 101.1 (H) 07/30/2020   PLT 121 (L) 07/30/2020   Lab Results  Component Value Date   IRON 16 (L) 07/09/2020   TIBC 507 (H) 07/09/2020   FERRITIN 13 07/09/2020   Current Barriers:  . Chronic Disease Management support and education needs related to anemia in a patient with Depression, Obesity, DM, HTN, COPD, DJD, HLD, CHF, hepatic cirrhosis  . Cause of anemia unkown . Financial constraints . Stress of chronic disease and process of testing for new diagnosis  Nurse Case Manager Clinical  Goal(s):  Marland Kitchen Patient will follow-up with gastroenterologist to discuss plan for anemia management . Patient will follow-up with hematologist to discuss plan for anemia management . Patient will demonstrate ongoing health maintenance behavior by keep all medical and iron infusion appointments . Patient will talk with [facility] regarding self-management of anemia  Interventions:  . Inter-disciplinary care team collaboration (see longitudinal plan of care) . Chart reviewed including recent office notes, surgical reports, pathology reports, and lab results o Discussed upward trend in Hgb . Reviewed medications . Discussed iron infusions . Discussed results of recent colonoscopies, endoscopies, and xray . Encouraged to seek appropriate medical attention with any new or worsening symptoms  . Reinforced an iron rich diet . Reviewed upcoming appointments . Therapeutic listening utilized . Encouraged patient to talk with LCSW regarding psychosocial concerns . Encouraged patient to reach out to [facility] as needed . Advised patient to seek emergency medical attention if necessary  Patient Self Care Activities:  . Self administers medications as prescribed . Call provider office for new concerns or questions . Able to perform ADLs and IADLs independently  Patient Goals: Over the next 30 days, patient will: . Keep all medical and iron infusion appointments . Seek appropriate medical attention for any new or worsening symptoms . set a daily activity goal . take a stool softener to prevent constipation . don't eat dairy products, such as milk, cheese and ice cream with those high in iron . eat 3 to 5 servings of fruits and vegetables each day . eat dark, leafy greens, such as spinach, chard and collards . include high-iron food, such as meat, chicken, fish, shellfish, dried fruit, beans and nuts in each meal . pair iron-rich foods with foods high in vitamin C, such as oranges, strawberries and  red peppers to help absorption . Reach out to LCSW with any psychosocial concerns    Care Plan : General Plan of Care (Adult)  Updates made by Ilean China, RN since 07/31/2020 12:00 AM    Problem: Coping Skills (General Plan of Care)     Long-Range Goal: Coping Skills Enhanced   Start Date: 07/31/2020  This Visit's Progress: On track  Priority: High  Note:   Current Barriers:  Marland Kitchen Knowledge Deficits related to cause of anemia and potential diagnosis of multiple myeloma . Care Coordination needs related to abnormal lab results in a patient with Depression, Obesity, DM, HTN, COPD, DJD, HLD, CHF, iron deficiency anemia, hepatic cirrhosis   Nurse Case Manager Clinical Goal(s):  . patient will work with hematologist/oncologist to address needs related to anemia and other abnormal lab values . patient will meet with RN Care Manager to address coping skills regarding chronic conditions and new diagnoses  Interventions:  . 1:1 collaboration with Claretta Fraise, MD regarding development and update of comprehensive plan of care as evidenced by provider attestation and co-signature . Inter-disciplinary care team collaboration (see longitudinal plan of care) . Evaluation of current treatment plan related to anemia and abnormal lab results with the possibility of a diagnosis of multiple myeloma and patient's adherence to plan as established by provider. . Chart reviewed including recent office notes, lab results, imaging reports . Collaborated with hematologist/oncologist regarding lab results and potential diagnosis of multiple myeloma  . Assisted in facilitating follow-up appointment scheduled  . Therapeutic listening utilized . Discussed recent tests, upcoming appointments, and upcoming tests . Assessed patient's understanding of future tests and knowledge of location, pre-procedure instructions, and check-in procedure . Assessed patient's willingness to participate in tests and treatment  options  Acknowledged difficulty of making life-long lifestyle changes   Assessed coping strategies  Encourage to continue talking with LCSW and to reach out as needed  Encouraged participation in pleasurable group activities such as hobbies, singing, sports or volunteering   Promoted a regular daily exercise program based on tolerance, ability and patient choice to support positive thinking about disease and potential diagnosis  Provided with RN Care Manager contact number and encouraged to reach out as needed  Self Care Activities:  . Self administers medications as prescribed . Calls pharmacy for medication refills . Performs ADL's independently . Calls provider office for new concerns or questions  Patient Goals Over the next 30 days, patient will: . Keep appointments for all medical appointments and tests . Talk with LCSW regarding anxiety, depression, psychosocial concerns, etc . Reach out to hematologist/oncologist with any questions or concerns regarding test results or testing directions . Get outside daily . Talk with friends/family regularly . Engage in an enjoyable activity everyday . Increase exercise and physical activity level as tolerated . Call RN Care  Manager as needed [phone]    Follow Up Plan:  . Telephone follow up appointment with care management team member scheduled for: 08/13/20 with RNCM . The patient has been provided with contact information for the care management team and has been advised to call with any health related questions or concerns.  . Next PCP appointment scheduled for: 09/04/20 with Dr Lajuana Ripple . Appointment for PET scan scheduled at [facility] on 08/03/20 . Appointment for bone marrow biopsy scheduled for [facility] on 08/04/20 . Appointment with hematology/oncology scheduled for 08/06/20  [name], BSN, RN-BC [place] [facility] [place] Management Direct Dial: [phone]

## 2020-07-31 NOTE — Patient Instructions (Signed)
Visit Information  PATIENT GOALS: Goals Addressed            This Visit's Progress   . Anemia Improved   On track    Timeframe:  Short-Term Goal Priority:  High Start Date:                             Expected End Date:                       Follow-up: 07/14/20  . Keep appt with hematologist on 07/09/20 . Have endoscopy on 07/28/20 . Call GI with any new or worsening symptoms . set a daily activity goal . take a stool softener to prevent constipation . don't eat dairy products, such as milk, cheese and ice cream with those high in iron . eat 3 to 5 servings of fruits and vegetables each day . eat dark, leafy greens, such as spinach, chard and collards . include high-iron food, such as meat, chicken, fish, shellfish, dried fruit, beans and nuts in each meal . pair iron-rich foods with foods high in vitamin C, such as oranges, strawberries and red peppers to help absorption    . COMPLETED: Chronic Disease Management Needs       CARE PLAN ENTRY (see longtitudinal plan of care for additional care plan information)  Current Barriers:  . Chronic Disease Management support, education, and care coordination needs related to Depression, Obesity, DM, HTN, COPD, DJD, HLD  Clinical Goal(s) related to Depression, Obesity, DM, HTN, COPD, DJD, HLD:  Over the next Depression, Obesity, DM, HTN, COPD, DJD, HLD days, patient will:  . Work with the care management team to address educational, disease management, and care coordination needs  . Begin or continue self health monitoring activities as directed today Measure and record cbg (blood glucose) 1 times daily and Measure and record blood pressure 3 times per week . Call provider office for new or worsened signs and symptoms Blood glucose findings outside established parameters and Blood pressure findings outside established parameters . Call care management team with questions or concerns . Verbalize basic understanding of patient centered plan  of care established today  Interventions related to Depression, Obesity, DM, HTN, COPD, DJD, HLD:  . Evaluation of current treatment plans and patient's adherence to plan as established by provider . Assessed patient understanding of disease states . Assessed patient's education and care coordination needs . Provided disease specific education to patient  . Collaborated with appropriate clinical care team members regarding patient needs . Chart reviewed including recent office notes, telephone notes, referrals, and lab results . Discussed transportation concerns o Provided information on [facility] . Discussed ambulation with walker and ability to perform ADLs . Reviewed and discussed medications . Discussed used of bedside commode . Advised that she needs a visit with Dr Lajuana Ripple. Will have front office staff arrange this . Reviewed and discussed most recent recommendations from pulmonologist, Dr Verlee Monte . Discussed home health and in-home care needs . Discussed use of oxygen. Has portable tanks for traveling outside of the home.  . Encouraged patient to reach out to CCM team as needed  Patient Self Care Activities related to Depression, Obesity, DM, HTN, COPD, DJD, HLD:  . Patient is unable to independently self-manage chronic health conditions  Initial goal documentation    . Coping Skills Enhanced   On track    Timeframe:  Long-Range Goal Priority:  High Start Date:  07/31/20                           Expected End Date:  03/27/21                      Follow-up: 08/13/20  . Keep appointments for all medical appointments and tests . Talk with LCSW regarding anxiety, depression, psychosocial concerns, etc . Reach out to hematologist/oncologist with any questions or concerns regarding test results or testing directions . Get outside daily . Talk with friends/family regularly . Engage in an enjoyable activity everyday . Increase exercise and physical activity level as tolerated . Call  RN Care Manager as needed [phone]        Patient verbalizes understanding of instructions provided today and agrees to view in [place].   Follow Up Plan:  . Telephone follow up appointment with care management team member scheduled for: 08/13/20 with RNCM . The patient has been provided with contact information for the care management team and has been advised to call with any health related questions or concerns.  . Next PCP appointment scheduled for: 09/04/20 with Dr Lajuana Ripple . Appointment for PET scan scheduled at [facility] on 08/03/20 . Appointment for bone marrow biopsy scheduled for [facility] on 08/04/20 . Appointment with hematology/oncology scheduled for 08/06/20  [name], BSN, RN-BC [place] [facility] [place] Management Direct Dial: [phone]

## 2020-08-03 ENCOUNTER — Ambulatory Visit: Payer: Medicare PPO | Admitting: Licensed Clinical Social Worker

## 2020-08-03 ENCOUNTER — Encounter (HOSPITAL_COMMUNITY)
Admission: RE | Admit: 2020-08-03 | Discharge: 2020-08-03 | Disposition: A | Discharge status: Home / Self Care | Payer: Medicare PPO | Source: Ambulatory Visit | Attending: Physician Assistant | Admitting: Physician Assistant

## 2020-08-03 ENCOUNTER — Other Ambulatory Visit: Payer: Self-pay | Admitting: Student

## 2020-08-03 ENCOUNTER — Other Ambulatory Visit: Payer: Self-pay

## 2020-08-03 ENCOUNTER — Encounter (HOSPITAL_COMMUNITY): Payer: Medicare PPO

## 2020-08-03 DIAGNOSIS — I10 Essential (primary) hypertension: Secondary | ICD-10-CM

## 2020-08-03 DIAGNOSIS — E1169 Type 2 diabetes mellitus with other specified complication: Secondary | ICD-10-CM | POA: Diagnosis not present

## 2020-08-03 DIAGNOSIS — F339 Major depressive disorder, recurrent, unspecified: Secondary | ICD-10-CM | POA: Diagnosis not present

## 2020-08-03 DIAGNOSIS — I5032 Chronic diastolic (congestive) heart failure: Secondary | ICD-10-CM

## 2020-08-03 DIAGNOSIS — C9000 Multiple myeloma not having achieved remission: Secondary | ICD-10-CM | POA: Diagnosis not present

## 2020-08-03 DIAGNOSIS — E119 Type 2 diabetes mellitus without complications: Secondary | ICD-10-CM | POA: Diagnosis not present

## 2020-08-03 DIAGNOSIS — I152 Hypertension secondary to endocrine disorders: Secondary | ICD-10-CM

## 2020-08-03 DIAGNOSIS — E1159 Type 2 diabetes mellitus with other circulatory complications: Secondary | ICD-10-CM

## 2020-08-03 DIAGNOSIS — D500 Iron deficiency anemia secondary to blood loss (chronic): Secondary | ICD-10-CM

## 2020-08-03 DIAGNOSIS — D472 Monoclonal gammopathy: Secondary | ICD-10-CM | POA: Diagnosis not present

## 2020-08-03 DIAGNOSIS — E669 Obesity, unspecified: Secondary | ICD-10-CM

## 2020-08-03 DIAGNOSIS — J449 Chronic obstructive pulmonary disease, unspecified: Secondary | ICD-10-CM

## 2020-08-03 DIAGNOSIS — E785 Hyperlipidemia, unspecified: Secondary | ICD-10-CM | POA: Diagnosis not present

## 2020-08-03 DIAGNOSIS — E66813 Obesity, class 3: Secondary | ICD-10-CM

## 2020-08-03 MED ORDER — FLUDEOXYGLUCOSE F - 18 (FDG) INJECTION
17.1100 | Freq: Once | INTRAVENOUS | Status: AC | PRN
  Administered 2020-08-03: 17.11 via INTRAVENOUS

## 2020-08-03 NOTE — Chronic Care Management (AMB) (Signed)
Chronic Care Management    Clinical Social Work Note  08/03/2020 Name: IANNA SALMELA MRN: [id number] DOB: [birth year]  DELCIA SPITZLEY is a 70 y.o. year old female who is a primary care patient of [name], DO. The CCM team was consulted to assist the patient with chronic disease management and/or care coordination needs related to: [employer] .   Engaged with patient by telephone for follow up visit in response to provider referral for social work chronic care management and care coordination services.   Consent to Services:  The patient was given information about Chronic Care Management services, agreed to services, and gave verbal consent prior to initiation of services.  Please see initial visit note for detailed documentation.   Patient agreed to services and consent obtained.   Assessment: Review of patient past medical history, allergies, medications, and health status, including review of relevant consultants reports was performed today as part of a comprehensive evaluation and provision of chronic care management and care coordination services.     SDOH (Social Determinants of Health) assessments and interventions performed:  SDOH Interventions   Flowsheet Row Most Recent Value  SDOH Interventions   Depression Interventions/Treatment  Medication       Advanced Directives Status: See Vynca application for related entries.  CCM Care Plan  Allergies  Allergen Reactions  . Keflex [Cephalexin] Nausea And Vomiting    Outpatient Encounter Medications as of 08/03/2020  Medication Sig  . Blood Glucose Calibration (GLUCOMETER DEX HIGH CONTROL) LIQD Test CBG's once daily.   . Blood Glucose Monitoring Suppl (TRUE METRIX AIR GLUCOSE METER) w/Device KIT Check BS daily Dx E11.9  . Cholecalciferol (VITAMIN D) 50 MCG (2000 UT) tablet Take 4,000 Units by mouth daily.  . ferrous sulfate 325 (65 FE) MG tablet Take 1 tablet (325 mg total) by mouth 2 (two) times  daily with a meal. (Patient taking differently: Take 325 mg by mouth daily with breakfast.)  . glucose blood (TRUE METRIX BLOOD GLUCOSE TEST) test strip Check BS daily Dx E11.9  . lisinopril (ZESTRIL) 10 MG tablet Take 1 tablet by mouth once daily (Patient taking differently: Take 10 mg by mouth daily.)  . lovastatin (MEVACOR) 20 MG tablet Take 1 tablet (20 mg total) by mouth at bedtime.  . metFORMIN (GLUCOPHAGE) 1000 MG tablet Take 1 tablet (1,000 mg total) by mouth 2 (two) times daily with a meal.  . nadolol (CORGARD) 20 MG tablet Take 1 tablet (20 mg total) by mouth daily.  Marland Kitchen oxyCODONE-acetaminophen (PERCOCET) 10-325 MG tablet Take 1 tablet by mouth every 6 (six) hours as needed for pain.  . OXYGEN Inhale 3 L into the lungs continuous.  . pantoprazole (PROTONIX) 40 MG tablet Take 1 tablet (40 mg total) by mouth daily.  . potassium chloride SA (KLOR-CON) 20 MEQ tablet Take 2 tablets (40 mEq total) by mouth 2 (two) times daily. (Patient taking differently: Take 40 mEq by mouth daily.)  . sodium chloride (OCEAN) 0.65 % SOLN nasal spray Place 1 spray into both nostrils as needed for congestion.  . torsemide (DEMADEX) 20 MG tablet Take 3 tablets (60 mg total) by mouth 2 (two) times daily. (Patient taking differently: Take 50 mg by mouth 2 (two) times daily.)  . TRUEplus Lancets 30G MISC Check BS daily Dx E11.9  . vitamin B-12 (CYANOCOBALAMIN) 1000 MCG tablet Take 2,000 mcg by mouth daily.  . [DISCONTINUED] albuterol (PROVENTIL HFA;VENTOLIN HFA) 108 (90 Base) MCG/ACT inhaler Inhale 2 puffs into the lungs every  6 (six) hours as needed for wheezing or shortness of breath.   No facility-administered encounter medications on file as of 08/03/2020.    Patient Active Problem List   Diagnosis Date Noted  . Hypoalbuminemia 05/17/2020  . Moderate protein malnutrition ([place]) 05/17/2020  . Melena 05/16/2020  . Hyperlipidemia   . Hypokalemia   . GI bleed 03/20/2020  . Lower GI bleed 03/19/2020  . Acute on  chronic congestive heart failure ([place])   . Symptomatic anemia   . Iron deficiency anemia   . Heme positive stool   . ABLA (acute blood loss anemia) 01/22/2020  . Supplemental oxygen dependent 10/22/2019  . Chronic dyspnea 10/22/2019  . Hypertension associated with diabetes ([place]) 10/22/2019  . Bilateral hand pain 09/21/2016  . Fatigue 07/30/2015  . Chronic diastolic HF (heart failure) ([place]) 06/29/2015  . Postmenopausal bleeding 04/17/2015  . Excessive daytime sleepiness 12/19/2014  . Swelling of lower extremity 11/27/2014  . Back pain 06/13/2013  . Sinusitis, chronic 05/30/2012  . Allergic rhinitis 06/06/2011  . Hyperlipidemia associated with type 2 diabetes mellitus ([place]) 03/24/2009  . Diabetes mellitus, type 2 ([place]) 08/12/2008  . Pulmonary nodule 08/29/2007  . COPD (chronic obstructive pulmonary disease) ([place]) 02/14/2007  . Class 3 obesity 05/25/2006  . TOBACCO DEPENDENCE 05/25/2006  . Depression, recurrent ([place]) 05/25/2006  . HYPERTENSION, BENIGN SYSTEMIC 05/25/2006  . DJD, UNSPECIFIED 05/25/2006    Conditions to be addressed/monitored: Monitor client management of depression issues   Care Plan : LCSW care plan  Updates made by Katha Cabal, LCSW since 08/03/2020 12:00 AM    Problem: Depression Identification (Depression)     Goal: Depressive Symptoms Identified; Manage Depression symptoms   Start Date: 08/03/2020  Expected End Date: 11/03/2020  This Visit's Progress: On track  Priority: Medium  Note:   Current Barriers:  . Chronic Mental Health needs related to depression and depression management . Mobility issues . Suicidal Ideation/Homicidal Ideation: No  Clinical Social Work Goal(s):  . patient will work with SW monthly by telephone or in person to reduce or manage symptoms related to depression and depression management . patient will work with SW monthly to address concerns related to mobility issues of client  Interventions: . 1:1 collaboration with  [name], DO regarding development and update of comprehensive plan of care as evidenced by provider attestation and co-signature . Talked with client about mobility of client (uses a walker to help her walk) . Talked with client about upcoming client medical appointments . Talked with client about transport needs of client . Talked with client about  pain issues of client . Talked with client about support from her family . Talked with client about appetite of client . Encouraged client to call RNCM as needed for nursing support  Patient Self Care Activities:  . Self administers medications as prescribed . Attends all scheduled provider appointments . Performs ADL's independently  Patient Coping Strengths:  . Family . Friends  Patient Self Care Deficits:  Marland Kitchen Depression issues  Patient Goals:  - spend time or talk with others at least 2 to 3 times per week - practice relaxation or meditation daily - keep a calendar with appointment dates  Follow Up Plan: LCSW to call client on 09/10/20     [name].Ellis Koffler MSW, LCSW Licensed Clinical Social Worker [facility] Care Management [phone]

## 2020-08-03 NOTE — Procedures (Signed)
Small Bowel Givens Capsule Study Procedure date:  07/28/20  Referring Provider:  Dr. Abbey Chatters PCP:  Dr. [name], DO  Indication for procedure:   70 year old female with history chronic anemia requiring multiple hospital admissions with profound anemia, with extensive evaluation to include EGD in Feb 2022 with Grade 1 esophageal varices, colonoscopy Jan 2022 with sigmoid and descending colon diverticulosis, 10 mm cecal polyp (sessile serrated adenoma), 7 mm ascending colon polyp (tubular adenoma), and one 18 mm hepatic flexure polyp with surveillance due in 1 year. EGD completed today with Grade 1 varices, gastritis, and capsule deployment into the duodenum.    Findings:   Capsule was complete the the cecum. Difficult visualization midway through small bowel with bile-stained mucosa. Dark effluent, old-blood appearing in distal small bowel but no source appreciate. No obvious mass, lesion, AVMs, etc.   First Gastric image: N/A First Duodenal image: 00:07:24 First Cecal image: 2:51:01 Gastric Passage time: N/A Small Bowel Passage time:  2h 68m Summary & Recommendations: 70year old female with history of chronic anemia, requiring hospital admissions for profound acute on chronic anemia, with colonoscopy, endoscopy and now capsule study on file without obvious source. However, capsule study did show what appeared to possibly be old blood in distal part of small bowel just before reaching the cecum. Patient did report taking oral iron leading up to the day prior to the procedure. She has no overt GI bleeding reported.   Recommend continued follow-up with Hematology as she is doing. 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 However, if she were to have overt GI bleeding, recommend ED evaluation with stat CTA if possible. May ultimately need enteroscopy . Will discuss further with Dr. CAbbey Chatters   AAnnitta Needs PhD, ANP-BC [facility]

## 2020-08-03 NOTE — Patient Instructions (Signed)
Visit Information  PATIENT GOALS: Goals Addressed            This Visit's Progress   . Protect My Health       Timeframe:  Short-Term Goal Priority:  Medium Progress : On Track Start Date:              08/03/20               Expected End Date:            11/03/20           Follow Up Date 09/10/20   Protect My Health (Patient) Manage Depression issues    Why is this important?    Screening tests can find diseases early when they are easier to treat.   Your doctor or nurse will talk with you about which tests are important for you.   Getting shots for common diseases like the flu and shingles will help prevent them.     Patient Self Care Activities:  . Self administers medications as prescribed . Attends all scheduled provider appointments . Performs ADL's independently  Patient Coping Strengths:  . Family . Friends  Patient Self Care Deficits:  Marland Kitchen Depression issues  Patient Goals:  - spend time or talk with others at least 2 to 3 times per week - practice relaxation or meditation daily - keep a calendar with appointment dates  Follow Up Plan: LCSW to call client on 09/10/20       [name].Jakie Debow MSW, LCSW Licensed Clinical Social Worker [facility] Care Management [phone]

## 2020-08-04 ENCOUNTER — Ambulatory Visit (HOSPITAL_COMMUNITY)
Admission: RE | Admit: 2020-08-04 | Discharge: 2020-08-04 | Disposition: A | Discharge status: Home / Self Care | Payer: Medicare PPO | Source: Ambulatory Visit | Attending: Hematology | Admitting: Hematology

## 2020-08-04 ENCOUNTER — Encounter (HOSPITAL_COMMUNITY): Payer: Self-pay | Admitting: Internal Medicine

## 2020-08-04 ENCOUNTER — Other Ambulatory Visit: Payer: Self-pay

## 2020-08-04 DIAGNOSIS — C9000 Multiple myeloma not having achieved remission: Secondary | ICD-10-CM | POA: Diagnosis not present

## 2020-08-04 DIAGNOSIS — D61818 Other pancytopenia: Secondary | ICD-10-CM | POA: Insufficient documentation

## 2020-08-04 DIAGNOSIS — D472 Monoclonal gammopathy: Secondary | ICD-10-CM | POA: Diagnosis not present

## 2020-08-04 LAB — CBC WITH DIFFERENTIAL/PLATELET
Abs Immature Granulocytes: 0.01 10*3/uL (ref 0.00–0.07)
Basophils Absolute: 0 10*3/uL (ref 0.0–0.1)
Basophils Relative: 1 %
Eosinophils Absolute: 0 10*3/uL (ref 0.0–0.5)
Eosinophils Relative: 1 %
HCT: 31.8 % — ABNORMAL LOW (ref 36.0–46.0)
Hemoglobin: 8.9 g/dL — ABNORMAL LOW (ref 12.0–15.0)
Immature Granulocytes: 0 %
Lymphocytes Relative: 37 %
Lymphs Abs: 1.5 10*3/uL (ref 0.7–4.0)
MCH: 27.8 pg (ref 26.0–34.0)
MCHC: 28 g/dL — ABNORMAL LOW (ref 30.0–36.0)
MCV: 99.4 fL (ref 80.0–100.0)
Monocytes Absolute: 0.4 10*3/uL (ref 0.1–1.0)
Monocytes Relative: 11 %
Neutro Abs: 2 10*3/uL (ref 1.7–7.7)
Neutrophils Relative %: 50 %
Platelets: 119 10*3/uL — ABNORMAL LOW (ref 150–400)
RBC: 3.2 MIL/uL — ABNORMAL LOW (ref 3.87–5.11)
RDW: 23.6 % — ABNORMAL HIGH (ref 11.5–15.5)
WBC: 3.9 10*3/uL — ABNORMAL LOW (ref 4.0–10.5)
nRBC: 0 % (ref 0.0–0.2)

## 2020-08-04 LAB — GLUCOSE, CAPILLARY: Glucose-Capillary: 112 mg/dL — ABNORMAL HIGH (ref 70–99)

## 2020-08-04 MED ORDER — FLUMAZENIL 0.5 MG/5ML IV SOLN
INTRAVENOUS | Status: AC
  Filled 2020-08-04: qty 5

## 2020-08-04 MED ORDER — MIDAZOLAM HCL 2 MG/2ML IJ SOLN
INTRAMUSCULAR | Status: AC
  Filled 2020-08-04: qty 4

## 2020-08-04 MED ORDER — FENTANYL CITRATE (PF) 100 MCG/2ML IJ SOLN
INTRAMUSCULAR | Status: AC | PRN
  Administered 2020-08-04 (×2): 50 ug via INTRAVENOUS

## 2020-08-04 MED ORDER — FENTANYL CITRATE (PF) 100 MCG/2ML IJ SOLN
INTRAMUSCULAR | Status: AC
  Filled 2020-08-04: qty 2

## 2020-08-04 MED ORDER — MIDAZOLAM HCL 2 MG/2ML IJ SOLN
INTRAMUSCULAR | Status: AC | PRN
  Administered 2020-08-04 (×3): 1 mg via INTRAVENOUS

## 2020-08-04 MED ORDER — LIDOCAINE HCL (PF) 1 % IJ SOLN
INTRAMUSCULAR | Status: AC | PRN
  Administered 2020-08-04: 10 mL via INTRADERMAL

## 2020-08-04 MED ORDER — SODIUM CHLORIDE 0.9 % IV SOLN: INTRAVENOUS | Status: DC

## 2020-08-04 NOTE — Procedures (Signed)
Interventional Radiology Procedure Note  Procedure: CT guided aspirate and core biopsy of right iliac bone Complications: None Recommendations: - Bedrest supine x 1 hrs - Hydrocodone PRN  Pain - Follow biopsy results  Signed,  Criselda Peaches, MD

## 2020-08-04 NOTE — Discharge Instructions (Addendum)
For questions /concerns may call Interventional Radiology at [phone]  You may remove your dressing and shower tomorrow afternoon    Bone Marrow Aspiration and Bone Marrow Biopsy, Adult, Care After This sheet gives you information about how to care for yourself after your procedure. Your health care provider may also give you more specific instructions. If you have problems or questions, contact your health care provider. What can I expect after the procedure? After the procedure, it is common to have:  Mild pain and tenderness.  Swelling.  Bruising. Follow these instructions at home: Puncture site care  Follow instructions from your health care provider about how to take care of the puncture site. Make sure you: ? Wash your hands with soap and water before and after you change your bandage (dressing). If soap and water are not available, use hand sanitizer. ? Change your dressing as told by your health care provider.  Check your puncture site every day for signs of infection. Check for: ? More redness, swelling, or pain. ? Fluid or blood. ? Warmth. ? Pus or a bad smell.   Activity  Return to your normal activities as told by your health care provider. Ask your health care provider what activities are safe for you.  Do not lift anything that is heavier than 10 lb (4.5 kg), or the limit that you are told, until your health care provider says that it is safe.  Do not drive for 24 hours if you were given a sedative during your procedure. General instructions  Take over-the-counter and prescription medicines only as told by your health care provider.  Do not take baths, swim, or use a hot tub until your health care provider approves. Ask your health care provider if you may take showers. You may only be allowed to take sponge baths.  If directed, put ice on the affected area. To do this: ? Put ice in a plastic bag. ? Place a towel between your skin and the bag. ? Leave the  ice on for 20 minutes, 2-3 times a day.  Keep all follow-up visits as told by your health care provider. This is important.   Contact a health care provider if:  Your pain is not controlled with medicine.  You have a fever.  You have more redness, swelling, or pain around the puncture site.  You have fluid or blood coming from the puncture site.  Your puncture site feels warm to the touch.  You have pus or a bad smell coming from the puncture site. Summary  After the procedure, it is common to have mild pain, tenderness, swelling, and bruising.  Follow instructions from your health care provider about how to take care of the puncture site and what activities are safe for you.  Take over-the-counter and prescription medicines only as told by your health care provider.  Contact a health care provider if you have any signs of infection, such as fluid or blood coming from the puncture site. This information is not intended to replace advice given to you by your health care provider. Make sure you discuss any questions you have with your health care provider.   Moderate Conscious Sedation, Adult, Care After This sheet gives you information about how to care for yourself after your procedure. Your health care provider may also give you more specific instructions. If you have problems or questions, contact your health care provider. What can I expect after the procedure? After the procedure, it is common to  have:  Sleepiness for several hours.  Impaired judgment for several hours.  Difficulty with balance.  Vomiting if you eat too soon. Follow these instructions at home: For the time period you were told by your health care provider:  Rest.  Do not participate in activities where you could fall or become injured.  Do not drive or use machinery.  Do not drink alcohol.  Do not take sleeping pills or medicines that cause drowsiness.  Do not make important decisions or sign  legal documents.  Do not take care of children on your own.      Eating and drinking  Follow the diet recommended by your health care provider.  Drink enough fluid to keep your urine pale yellow.  If you vomit: ? Drink water, juice, or soup when you can drink without vomiting. ? Make sure you have little or no nausea before eating solid foods.   General instructions  Take over-the-counter and prescription medicines only as told by your health care provider.  Have a responsible adult stay with you for the time you are told. It is important to have someone help care for you until you are awake and alert.  Do not smoke.  Keep all follow-up visits as told by your health care provider. This is important. Contact a health care provider if:  You are still sleepy or having trouble with balance after 24 hours.  You feel light-headed.  You keep feeling nauseous or you keep vomiting.  You develop a rash.  You have a fever.  You have redness or swelling around the IV site. Get help right away if:  You have trouble breathing.  You have new-onset confusion at home. Summary  After the procedure, it is common to feel sleepy, have impaired judgment, or feel nauseous if you eat too soon.  Rest after you get home. Know the things you should not do after the procedure.  Follow the diet recommended by your health care provider and drink enough fluid to keep your urine pale yellow.  Get help right away if you have trouble breathing or new-onset confusion at home. This information is not intended to replace advice given to you by your health care provider. Make sure you discuss any questions you have with your health care provider. Document Revised: 07/12/2019 Document Reviewed: 02/07/2019 Elsevier Patient Education  2021 [employer].

## 2020-08-04 NOTE — Consult Note (Signed)
Chief Complaint: Patient was seen in consultation today for CT-guided bone marrow biopsy  Referring Physician(s): [place] M/Katragadda,S  Supervising Physician: Jacqulynn Cadet  Patient Status: [facility] - Out-pt  History of Present Illness: Savannah Burns is a 71 y.o. female with past medical history of left adrenal adenoma, anxiety, arthritis, depression, diabetes, esophageal varices, diastolic dysfunction, nephrolithiasis, hyperlipidemia, hypertension, panic attacks, iron deficiency anemia related to GI bleeding,  borderline B12 deficiency and MGUS.  She presents today for CT-guided bone marrow biopsy for further evaluation/rule out myeloma.  Past Medical History:  Diagnosis Date  . Adrenal adenoma, left    Stable  . Anxiety   . Arthritis    bilateral hands  . Depression   . Diabetes mellitus, type 2 ([place]) 08/12/2008   Qualifier: Diagnosis of  By: Deborra Medina MD, Tanja Port    . Dyspnea   . Esophageal varices ([place])   . Grade II diastolic dysfunction   . History of kidney stones   . Hyperlipidemia   . Hypertension   . Lower back pain   . Lower GI bleed 03/19/2020  . Panic attacks   . Pneumonia    currently taking antibiotic and prednisone for early stages of pneumonia  . Pulmonary nodules    bilateral  . Skin cancer    face    Past Surgical History:  Procedure Laterality Date  . BIOPSY  04/07/2020   Procedure: BIOPSY;  Surgeon: Eloise Harman, DO;  [facility]: [facility];  Service: Endoscopy;;  . Breast Cystectomy  Right   . CESAREAN SECTION    . COLONOSCOPY WITH PROPOFOL N/A 01/25/2020   Dr. Abbey Chatters: Nonbleeding internal hemorrhoids, diverticulosis, 5 mm polyp removed from the ascending colon, 10 mm polyp removed from the sigmoid colon, 30 mm polyp (tubulovillous adenoma with no high-grade dysplasia) removed from the transverse colon via piecemeal status post tattoo.  Other polyps were tubular adenomas.  3 month surveillance colonoscopy recommended.  .  COLONOSCOPY WITH PROPOFOL N/A 04/07/2020   Procedure: COLONOSCOPY WITH PROPOFOL;  Surgeon: Eloise Harman, DO;  Location: [facility];  Service: Endoscopy;  Laterality: N/A;  3:00pm, pt knows new time per office  . CYSTOSCOPY/URETEROSCOPY/HOLMIUM LASER/STENT PLACEMENT Bilateral 03/01/2019   Procedure: CYSTOSCOPY/RETROGRADEURETEROSCOPY/HOLMIUM LASER/STENT PLACEMENT;  Surgeon: Ceasar Mons, MD;  Location: [facility];  Service: Urology;  Laterality: Bilateral;  ONLY NEEDS 60 MIN  . ESOPHAGOGASTRODUODENOSCOPY (EGD) WITH PROPOFOL N/A 01/25/2020   Dr. Abbey Chatters: 4 columns grade 1 esophageal varices  . ESOPHAGOGASTRODUODENOSCOPY (EGD) WITH PROPOFOL N/A 05/18/2020   Procedure: ESOPHAGOGASTRODUODENOSCOPY (EGD) WITH PROPOFOL;  Surgeon: Eloise Harman, DO;  [facility];  Service: Endoscopy;  Laterality: N/A;  . ESOPHAGOGASTRODUODENOSCOPY (EGD) WITH PROPOFOL N/A 07/28/2020   Procedure: ESOPHAGOGASTRODUODENOSCOPY (EGD) WITH PROPOFOL;  Surgeon: Eloise Harman, DO;  [facility];  Service: Endoscopy;  Laterality: N/A;  am or early PM due to givens capsule placement  . GIVENS CAPSULE STUDY N/A 05/18/2020   Procedure: GIVENS CAPSULE STUDY;  Surgeon: Harvel Quale, MD;  [facility];  Service: Gastroenterology;  Laterality: N/A;  . GIVENS CAPSULE STUDY N/A 07/28/2020   Procedure: GIVENS CAPSULE STUDY;  Surgeon: Eloise Harman, DO;  [facility];  Service: Endoscopy;  Laterality: N/A;  . POLYPECTOMY  01/25/2020   Procedure: POLYPECTOMY;  Surgeon: Eloise Harman, DO;  [facility];  Service: Endoscopy;;  . POLYPECTOMY  04/07/2020   Procedure: POLYPECTOMY INTESTINAL;  Surgeon: Eloise Harman, DO;  [facility];  Service: Endoscopy;;  . SKIN CANCER EXCISION     Face  . SPINE SURGERY    . SUBMUCOSAL TATTOO INJECTION  01/25/2020   Procedure: SUBMUCOSAL TATTOO INJECTION;  Surgeon: Eloise Harman, DO;  [facility];  Service: Endoscopy;;    Allergies: Keflex [cephalexin]  Medications: Prior to Admission medications   Medication Sig Start Date End Date Taking? Authorizing Provider  Cholecalciferol (VITAMIN D) 50 MCG (2000 UT) tablet Take 4,000 Units by mouth daily.   Yes [provider]  ferrous sulfate 325 (65 FE) MG tablet Take 1 tablet (325 mg total) by mouth 2 (two) times daily with a meal. Patient taking differently: Take 325 mg by mouth daily with breakfast. 06/25/20 08/24/20 Yes Gottschalk, Ashly M, DO  furosemide (LASIX) 20 MG tablet Take 20 mg by mouth. bid   Yes [provider]  lisinopril (ZESTRIL) 10 MG tablet Take 1 tablet by mouth once daily Patient taking differently: Take 10 mg by mouth daily. 02/13/20  Yes Gottschalk, Leatrice Jewels M, DO  lovastatin (MEVACOR) 20 MG tablet Take 1 tablet (20 mg total) by mouth at bedtime. 10/30/19  Yes Ronnie Doss M, DO  metFORMIN (GLUCOPHAGE) 1000 MG tablet Take 1 tablet (1,000 mg total) by mouth 2 (two) times daily with a meal. 01/22/20  Yes [name], PA-C  oxyCODONE-acetaminophen (PERCOCET) 10-325 MG tablet Take 1 tablet by mouth every 6 (six) hours as needed for pain.   Yes [provider]  OXYGEN Inhale 3 L into the lungs continuous.   Yes [provider]  pantoprazole (PROTONIX) 40 MG tablet Take 1 tablet (40 mg total) by mouth daily. 06/25/20 12/22/20 Yes Gottschalk, Leatrice Jewels M, DO  potassium chloride SA (KLOR-CON) 20 MEQ tablet Take 2 tablets (40 mEq total) by mouth 2 (two) times daily. Patient taking differently: Take 40 mEq by mouth daily. 04/24/20  Yes Gottschalk, Leatrice Jewels M, DO  sodium chloride (OCEAN) 0.65 % SOLN nasal spray Place 1 spray into both nostrils as needed for congestion.   Yes [provider]  vitamin B-12 (CYANOCOBALAMIN) 1000 MCG tablet Take 2,000 mcg by mouth daily.   Yes [provider]  Blood Glucose Calibration (GLUCOMETER DEX HIGH CONTROL) LIQD Test CBG's once daily.      [provider]  Blood Glucose Monitoring Suppl (TRUE METRIX AIR GLUCOSE METER) w/Device KIT Check BS daily Dx E11.9 10/02/19   Ronnie Doss M, DO  glucose blood (TRUE METRIX BLOOD GLUCOSE TEST) test strip Check BS daily Dx E11.9 10/02/19   Ronnie Doss M, DO  nadolol (CORGARD) 20 MG tablet Take 1 tablet (20 mg total) by mouth daily. 05/19/20 05/19/21  Erenest Rasher, PA-C  torsemide (DEMADEX) 20 MG tablet Take 3 tablets (60 mg total) by mouth 2 (two) times daily. Patient taking differently: Take 50 mg by mouth 2 (two) times daily. 02/18/20 05/18/20  [name], PA-C  TRUEplus Lancets 30G MISC Check BS daily Dx E11.9 10/02/19   Ronnie Doss M, DO  albuterol (PROVENTIL HFA;VENTOLIN HFA) 108 (90 Base) MCG/ACT inhaler Inhale 2 puffs into the lungs every 6 (six) hours as needed for wheezing or shortness of breath. 09/21/16 09/22/16  Katheren Shams, DO     Family History  Problem Relation Age of Onset  . Diabetes Father   . Heart disease Father 20       MI  . Hypertension Father   . Anemia Mother        Transfusion dependent  .  COPD Sister   . Cancer Paternal Grandmother 71       Pancreatic    Social History   Socioeconomic History  . Marital status: Widowed    Spouse name: Not on file  . Number of children: 2  . Years of education: 91  . Highest education level: Not on file  Occupational History  . Occupation: Retired   Tobacco Use  . Smoking status: Former Smoker    Packs/day: 1.50    Years: 40.00    Pack years: 60.00    Types: Cigarettes    Quit date: 04/29/2015    Years since quitting: 5.2  . Smokeless tobacco: Never Used  . Tobacco comment: Quit smoking 04/2015- Previous 1.5 ppd smoker  Vaping Use  . Vaping Use: Never used  Substance and Sexual Activity  . Alcohol use: No    Alcohol/week: 0.0 standard drinks  . Drug use: No  . Sexual activity: Not Currently    Birth control/protection: Post-menopausal  Other Topics Concern  . Not on file   Social History Narrative   Her 31 year old granddaughter lives with her - one daughter lives nearby, but she doesn't have a good relationship with her. Has a great relationship with other daughter who lives 1.5 hrs away - talks to her daily on the phone.   Social Determinants of Health   Financial Resource Strain: Low Risk   . Difficulty of Paying Living Expenses: Not very hard  Food Insecurity: No Food Insecurity  . Worried About Charity fundraiser in the Last Year: Never true  . Ran Out of Food in the Last Year: Never true  Transportation Needs: No Transportation Needs  . Lack of Transportation (Medical): No  . Lack of Transportation (Non-Medical): No  Physical Activity: Inactive  . Days of Exercise per Week: 0 days  . Minutes of Exercise per Session: 0 min  Stress: No Stress Concern Present  . Feeling of Stress : Not at all  Social Connections: Socially Isolated  . Frequency of Communication with Friends and Family: More than three times a week  . Frequency of Social Gatherings with Friends and Family: Once a week  . Attends Religious Services: Never  . Active Member of Clubs or Organizations: No  . Attends Archivist Meetings: Never  . Marital Status: Widowed     Review of Systems : denies fever, headache, chest pain, cough, abdominal pain, nausea, vomiting or bleeding.  She does have chronic dyspnea and is on home O2 as well as back pain and lower extremity edema  Vital Signs: BP (!) 143/73   Pulse 71   Temp 98 F (36.7 C) (Oral)   Resp 20   Ht 5' 2"  (1.575 m)   Wt 280 lb (127 kg)   SpO2 100%   BMI 51.21 kg/m   Physical Exam awake, alert.  Chest clear to auscultation bilaterally anteriorly.  Heart with regular rate and rhythm, positive murmur.  Abdomen obese, soft, positive bowel sounds, currently nontender.  Bilateral lower extremity edema noted.  Imaging: DG Bone Survey Met  Result Date: 07/17/2020 CLINICAL DATA:  Monoclonal gammopathy EXAM:  METASTATIC BONE SURVEY COMPARISON:  Multiple prior imaging studies. FINDINGS: Lateral skull: No concerning lytic lesion.  Vascular channels. Right shoulder: Degenerative changes.  No lytic lesions. Left shoulder: Degenerative changes.  No lytic lesions. Left humerus: No lytic lesions. Right humerus: No lytic lesions. Right forearm: No lytic lesions. Left forearm: No lytic lesions. Cervical spine two views: No  lytic lesions or fracture. Thoracic spine two views: No lytic lesions or fractures. Lumbar spine two views: No lytic lesions or fractures. Previous fusion surgery L4-5. Gallstones noted in the right upper quadrant. Chest: No rib lesion seen. Multiple pulmonary calcifications as seen chronically. AP pelvis: No lytic lesion. Left femur: No lytic lesion. Right femur: No lytic lesion. Right tib fib: No lytic lesion. Left tib fib: No lytic lesion. IMPRESSION: No lytic lesions. Chronic degenerative changes of the shoulders and spine. Previous L4-5 fusion. Gallstones. Innumerable calcified pulmonary micro nodules. Electronically Signed   By: Nelson Chimes M.D.   On: 07/17/2020 14:40    Labs:  CBC: Recent Labs    07/16/20 1119 07/23/20 0829 07/27/20 0929 07/30/20 1007  WBC 4.1 6.4 5.1 3.3*  HGB 6.4* 7.2* 7.5* 7.7*  HCT 23.7* 26.4* 26.9* 28.2*  PLT 104* 114* 120* 121*    COAGS: Recent Labs    01/22/20 1639 03/19/20 1516 05/17/20 0532  INR 1.1 1.0 1.1    BMP: Recent Labs    10/29/19 1644 01/22/20 1457 02/07/20 1415 03/19/20 1516 05/16/20 1206 05/17/20 0524 05/19/20 0647 07/09/20 1216  NA 138   < > 139   < > 137 136 134* 137  K 4.1   < > 3.7   < > 3.4* 4.1 3.6 3.6  CL 101   < > 97   < > 101 102 100 101  CO2 28   < > 31*   < > 30 30 28 26   GLUCOSE 176*   < > 222*   < > 119* 115* 123* 100*  BUN 12   < > 15   < > 10 12 10 17   CALCIUM 8.3*   < > 8.7   < > 8.6* 8.1* 8.1* 8.5*  CREATININE 0.63   < > 0.73   < > 0.57 0.68 0.64 0.63  GFRNONAA 91   < > 84   < > >60 >60 >60 >60  GFRAA  105  --  96  --   --   --   --   --    < > = values in this interval not displayed.    LIVER FUNCTION TESTS: Recent Labs    05/16/20 1206 05/17/20 0524 05/19/20 0647 07/09/20 1216  BILITOT 0.3 0.3 0.6 0.3  AST 19 23 25 24   ALT 14 14 15 16   ALKPHOS 83 82 82 91  PROT 7.7 7.8 8.1 8.6*  ALBUMIN 2.8* 2.7* 2.9* 3.0*    TUMOR MARKERS: No results for input(s): AFPTM, CEA, CA199, CHROMGRNA in the last 8760 hours.  Assessment and Plan: 70 y.o. female with past medical history of left adrenal adenoma, anxiety, arthritis, depression, diabetes, esophageal varices, diastolic dysfunction, nephrolithiasis, hyperlipidemia, hypertension, panic attacks, iron deficiency anemia related to GI bleeding,borderline B12 deficiency and MGUS.  She presents today for CT-guided bone marrow biopsy for further evaluation/rule out myeloma.Risks and benefits of procedure was discussed with the patient  including, but not limited to bleeding, infection, damage to adjacent structures or low yield requiring additional tests.  All of the questions were answered and there is agreement to proceed.  Consent signed and in chart.     Thank you for this interesting consult.  I greatly enjoyed meeting Savannah Burns and look forward to participating in their care.  A copy of this report was sent to the requesting provider on this date.  Electronically Signed: D. Rowe Robert, PA-C 08/04/2020, 9:45 AM   I spent a  total of  20 minutes   in face to face in clinical consultation, greater than 50% of which was counseling/coordinating care for CT-guided bone marrow biopsy

## 2020-08-05 DIAGNOSIS — Z79899 Other long term (current) drug therapy: Secondary | ICD-10-CM | POA: Diagnosis not present

## 2020-08-05 DIAGNOSIS — M5136 Other intervertebral disc degeneration, lumbar region: Secondary | ICD-10-CM | POA: Diagnosis not present

## 2020-08-05 DIAGNOSIS — G894 Chronic pain syndrome: Secondary | ICD-10-CM | POA: Diagnosis not present

## 2020-08-06 ENCOUNTER — Other Ambulatory Visit: Payer: Self-pay

## 2020-08-06 ENCOUNTER — Inpatient Hospital Stay (HOSPITAL_COMMUNITY): Payer: Medicare PPO

## 2020-08-06 ENCOUNTER — Inpatient Hospital Stay (HOSPITAL_BASED_OUTPATIENT_CLINIC_OR_DEPARTMENT_OTHER): Payer: Medicare PPO | Admitting: Physician Assistant

## 2020-08-06 VITALS — BP 144/60 | HR 72 | Temp 97.0°F | Resp 20 | Wt 282.0 lb

## 2020-08-06 DIAGNOSIS — D3502 Benign neoplasm of left adrenal gland: Secondary | ICD-10-CM | POA: Diagnosis not present

## 2020-08-06 DIAGNOSIS — D7589 Other specified diseases of blood and blood-forming organs: Secondary | ICD-10-CM | POA: Diagnosis not present

## 2020-08-06 DIAGNOSIS — D472 Monoclonal gammopathy: Secondary | ICD-10-CM

## 2020-08-06 DIAGNOSIS — E538 Deficiency of other specified B group vitamins: Secondary | ICD-10-CM | POA: Diagnosis not present

## 2020-08-06 DIAGNOSIS — D696 Thrombocytopenia, unspecified: Secondary | ICD-10-CM

## 2020-08-06 DIAGNOSIS — R0602 Shortness of breath: Secondary | ICD-10-CM | POA: Diagnosis not present

## 2020-08-06 DIAGNOSIS — D509 Iron deficiency anemia, unspecified: Secondary | ICD-10-CM | POA: Diagnosis not present

## 2020-08-06 DIAGNOSIS — E118 Type 2 diabetes mellitus with unspecified complications: Secondary | ICD-10-CM | POA: Diagnosis not present

## 2020-08-06 DIAGNOSIS — D500 Iron deficiency anemia secondary to blood loss (chronic): Secondary | ICD-10-CM | POA: Diagnosis not present

## 2020-08-06 DIAGNOSIS — I509 Heart failure, unspecified: Secondary | ICD-10-CM | POA: Diagnosis not present

## 2020-08-06 LAB — CBC WITH DIFFERENTIAL/PLATELET
Abs Immature Granulocytes: 0.01 10*3/uL (ref 0.00–0.07)
Basophils Absolute: 0 10*3/uL (ref 0.0–0.1)
Basophils Relative: 0 %
Eosinophils Absolute: 0 10*3/uL (ref 0.0–0.5)
Eosinophils Relative: 1 %
HCT: 29.5 % — ABNORMAL LOW (ref 36.0–46.0)
Hemoglobin: 8.3 g/dL — ABNORMAL LOW (ref 12.0–15.0)
Immature Granulocytes: 0 %
Lymphocytes Relative: 31 %
Lymphs Abs: 1 10*3/uL (ref 0.7–4.0)
MCH: 28.4 pg (ref 26.0–34.0)
MCHC: 28.1 g/dL — ABNORMAL LOW (ref 30.0–36.0)
MCV: 101 fL — ABNORMAL HIGH (ref 80.0–100.0)
Monocytes Absolute: 0.3 10*3/uL (ref 0.1–1.0)
Monocytes Relative: 10 %
Neutro Abs: 1.9 10*3/uL (ref 1.7–7.7)
Neutrophils Relative %: 58 %
Platelets: 96 10*3/uL — ABNORMAL LOW (ref 150–400)
RBC: 2.92 MIL/uL — ABNORMAL LOW (ref 3.87–5.11)
RDW: 22.5 % — ABNORMAL HIGH (ref 11.5–15.5)
WBC: 3.4 10*3/uL — ABNORMAL LOW (ref 4.0–10.5)
nRBC: 0 % (ref 0.0–0.2)

## 2020-08-06 LAB — SURGICAL PATHOLOGY

## 2020-08-06 NOTE — Patient Instructions (Signed)
[place] at [facility] Discharge Instructions  You were seen today by Tarri Abernethy PA-C for your anemia and MGUS.  Your PET can was normal, but we are still waiting on the results of your bone marrow biopsy.  I will call you with these results.      LABS: Return on 08/27/2020 for lab work.   OTHER TESTS: CBC every 2 weeks  MEDICATIONS: No changes to your home medications.    FOLLOW-UP APPOINTMENT: Scheduled for 09/03/2020   Thank you for choosing [place] at [facility] to provide your oncology and hematology care.  To afford each patient quality time with our provider, please arrive at least 15 minutes before your scheduled appointment time.   If you have a lab appointment with the [facility] please come in thru the Main Entrance and check in at the main information desk.  You need to re-schedule your appointment should you arrive 10 or more minutes late.  We strive to give you quality time with our providers, and arriving late affects you and other patients whose appointments are after yours.  Also, if you no show three or more times for appointments you may be dismissed from the clinic at the providers discretion.     Again, thank you for choosing [facility].  Our hope is that these requests will decrease the amount of time that you wait before being seen by our physicians.       _____________________________________________________________  Should you have questions after your visit to [facility], please contact our office at [phone] and follow the prompts.  Our office hours are 8:00 a.m. and 4:30 p.m. Monday - Friday.  Please note that voicemails left after 4:00 p.m. may not be returned until the following business day.  We are closed weekends and major holidays.  You do have access to a nurse 24-7, just call the main number to the clinic [phone] and do not press any options, hold on the line  and a nurse will answer the phone.    For prescription refill requests, have your pharmacy contact our office and allow 72 hours.    Due to Covid, you will need to wear a mask upon entering the hospital. If you do not have a mask, a mask will be given to you at the Main Entrance upon arrival. For doctor visits, patients may have 1 support person age 38 or older with them. For treatment visits, patients can not have anyone with them due to social distancing guidelines and our immunocompromised population.

## 2020-08-06 NOTE — Progress Notes (Signed)
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 female returns for routine follow-up of her iron deficiency anemia and MGUS.  She was last seen by Tarri Abernethy PA-C on 07/17/2020.  Iron deficiency anemia is suspected to be secondary to GI bleeding, as detailed in previous notes.  She had multiple episodes of melena and hospital admissions over the past 6 months, but source of GI bleeding has not yet been found.  Most recent upper endoscopy was 07/28/2020, which found grade 1 esophageal varices and gastritis.  Capsule was placed, which per GI note did not show any obvious lesions, erosions, or overt GI bleeding.  Due to her iron deficiency, she has received IV Venofer 1000 mg in 3 divided doses, last dose given 07/23/2020.  She was also given RBC transfusion x1 in the hematology/oncology clinic, in addition to multiple transfusions given while hospitalized.  Since her most recent iron transfusions, she reports feeling improved energy.  She reports that her energy level is currently 50%, which is improved from her previous energy level of "zero."  She does continue to have shortness of breath with exertion, but denies chest pain, presyncopal episodes, or palpitations.  She denies any further bleeding episodes since our last visit, no black/tarry stools, melena, hematochezia, hematemesis, hemoptysis, hematuria, or epistaxis.  Labs today show that her hemoglobin has improved to 8.3.  Patient is also being worked up for MGUS versus multiple myeloma, as SPEP/IFE revealed M spike 2.6 with monoclonal IgG kappa gammopathy.  She had bone marrow biopsy on 08/04/2020, with pathology reports pending.  PET scan on 08/03/2020 was negative for  any focal hypermetabolic activity in the skeleton, but did show mild diffuse marrow activity which is likely anemia marrow response after being given IV iron; no soft tissue plasmacytoma; no lytic lesions on CT portion of exam.  Savannah Burns denies any B symptoms such as fever, chills, night sweats, unintentional weight loss.  No new bone pain or fractures.  No new onset paresthesias or neuropathy.  No new neurologic deficits.  She has 50% energy and 100% appetite. She endorses that she is maintaining a stable weight.    REVIEW OF SYSTEMS:  Review of Systems  Constitutional: Positive for fatigue (50% energy). Negative for appetite change, chills, diaphoresis, fever and unexpected weight change.  HENT:   Negative for lump/mass and nosebleeds.   Eyes: Negative for eye problems.  Respiratory: Positive for shortness of breath (with exertion). Negative for cough and hemoptysis.   Cardiovascular: Positive for leg swelling (chronic). Negative for chest pain and palpitations.  Gastrointestinal: Negative for abdominal pain, blood in stool, constipation, diarrhea, nausea and vomiting.  Genitourinary: Negative for hematuria.   Skin: Negative.   Neurological: Negative for dizziness, headaches and light-headedness.  Hematological: Does not bruise/bleed easily.      PAST MEDICAL/SURGICAL HISTORY:  Past Medical History:  Diagnosis Date  . Adrenal adenoma, left    Stable  . Anxiety   . Arthritis    bilateral hands  . Depression   . Diabetes mellitus, type 2 ([facility]) 08/12/2008   Qualifier: Diagnosis of  By: Deborra Medina MD, Tanja Port    . Dyspnea   . Esophageal varices ([place])   . Grade II diastolic  dysfunction   . History of kidney stones   . Hyperlipidemia   . Hypertension   . Lower back pain   . Lower GI bleed 03/19/2020  . Panic attacks   . Pneumonia    currently taking antibiotic and prednisone for early stages of pneumonia  . Pulmonary nodules    bilateral  . Skin cancer    face   Past Surgical  History:  Procedure Laterality Date  . BIOPSY  04/07/2020   Procedure: BIOPSY;  Surgeon: Eloise Harman, DO;  [facility]: [facility];  Service: Endoscopy;;  . Breast Cystectomy  Right   . CESAREAN SECTION    . COLONOSCOPY WITH PROPOFOL N/A 01/25/2020   Dr. Abbey Chatters: Nonbleeding internal hemorrhoids, diverticulosis, 5 mm polyp removed from the ascending colon, 10 mm polyp removed from the sigmoid colon, 30 mm polyp (tubulovillous adenoma with no high-grade dysplasia) removed from the transverse colon via piecemeal status post tattoo.  Other polyps were tubular adenomas.  3 month surveillance colonoscopy recommended.  . COLONOSCOPY WITH PROPOFOL N/A 04/07/2020   Procedure: COLONOSCOPY WITH PROPOFOL;  Surgeon: Eloise Harman, DO;  Location: [facility];  Service: Endoscopy;  Laterality: N/A;  3:00pm, pt knows new time per office  . CYSTOSCOPY/URETEROSCOPY/HOLMIUM LASER/STENT PLACEMENT Bilateral 03/01/2019   Procedure: CYSTOSCOPY/RETROGRADEURETEROSCOPY/HOLMIUM LASER/STENT PLACEMENT;  Surgeon: Ceasar Mons, MD;  Location: [facility];  Service: Urology;  Laterality: Bilateral;  ONLY NEEDS 60 MIN  . ESOPHAGOGASTRODUODENOSCOPY (EGD) WITH PROPOFOL N/A 01/25/2020   Dr. Abbey Chatters: 4 columns grade 1 esophageal varices  . ESOPHAGOGASTRODUODENOSCOPY (EGD) WITH PROPOFOL N/A 05/18/2020   Procedure: ESOPHAGOGASTRODUODENOSCOPY (EGD) WITH PROPOFOL;  Surgeon: Eloise Harman, DO;  [facility];  Service: Endoscopy;  Laterality: N/A;  . ESOPHAGOGASTRODUODENOSCOPY (EGD) WITH PROPOFOL N/A 07/28/2020   Procedure: ESOPHAGOGASTRODUODENOSCOPY (EGD) WITH PROPOFOL;  Surgeon: Eloise Harman, DO;  [facility];  Service: Endoscopy;  Laterality: N/A;  am or early PM due to givens capsule placement  . GIVENS CAPSULE STUDY N/A 05/18/2020   Procedure: GIVENS CAPSULE STUDY;  Surgeon: Harvel Quale, MD;  [facility];  Service: Gastroenterology;  Laterality: N/A;  . GIVENS  CAPSULE STUDY N/A 07/28/2020   Procedure: GIVENS CAPSULE STUDY;  Surgeon: Eloise Harman, DO;  [facility];  Service: Endoscopy;  Laterality: N/A;  . POLYPECTOMY  01/25/2020   Procedure: POLYPECTOMY;  Surgeon: Eloise Harman, DO;  [facility];  Service: Endoscopy;;  . POLYPECTOMY  04/07/2020   Procedure: POLYPECTOMY INTESTINAL;  Surgeon: Eloise Harman, DO;  [facility];  Service: Endoscopy;;  . SKIN CANCER EXCISION     Face  . SPINE SURGERY    . SUBMUCOSAL TATTOO INJECTION  01/25/2020   Procedure: SUBMUCOSAL TATTOO INJECTION;  Surgeon: Eloise Harman, DO;  [facility];  Service: Endoscopy;;     SOCIAL HISTORY:  Social History   Socioeconomic History  . Marital status: Widowed    Spouse name: Not on file  . Number of children: 2  . Years of education: 90  . Highest education level: Not on file  Occupational History  . Occupation: Retired   Tobacco Use  . Smoking status: Former Smoker    Packs/day: 1.50    Years: 40.00    Pack years: 60.00    Types: Cigarettes    Quit date: 04/29/2015    Years since quitting: 5.2  . Smokeless tobacco: Never Used  . Tobacco comment: Quit smoking 04/2015- Previous  1.5 ppd smoker  Vaping Use  . Vaping Use: Never used  Substance and Sexual Activity  . Alcohol use: No    Alcohol/week: 0.0 standard drinks  . Drug use: No  . Sexual activity: Not Currently    Birth control/protection: Post-menopausal  Other Topics Concern  . Not on file  Social History Narrative   Her 29 year old granddaughter lives with her - one daughter lives nearby, but she doesn't have a good relationship with her. Has a great relationship with other daughter who lives 1.5 hrs away - talks to her daily on the phone.   Social Determinants of Health   Financial Resource Strain: Low Risk   . Difficulty of Paying Living Expenses: Not very hard  Food Insecurity: No Food Insecurity  . Worried About Charity fundraiser in  the Last Year: Never true  . Ran Out of Food in the Last Year: Never true  Transportation Needs: No Transportation Needs  . Lack of Transportation (Medical): No  . Lack of Transportation (Non-Medical): No  Physical Activity: Inactive  . Days of Exercise per Week: 0 days  . Minutes of Exercise per Session: 0 min  Stress: No Stress Concern Present  . Feeling of Stress : Not at all  Social Connections: Socially Isolated  . Frequency of Communication with Friends and Family: More than three times a week  . Frequency of Social Gatherings with Friends and Family: Once a week  . Attends Religious Services: Never  . Active Member of Clubs or Organizations: No  . Attends Archivist Meetings: Never  . Marital Status: Widowed  Intimate Partner Violence: Not At Risk  . Fear of Current or Ex-Partner: No  . Emotionally Abused: No  . Physically Abused: No  . Sexually Abused: No    FAMILY HISTORY:  Family History  Problem Relation Age of Onset  . Diabetes Father   . Heart disease Father 50       MI  . Hypertension Father   . Anemia Mother        Transfusion dependent  . COPD Sister   . Cancer Paternal Grandmother 68       Pancreatic    CURRENT MEDICATIONS:  Outpatient Encounter Medications as of 08/06/2020  Medication Sig Note  . Blood Glucose Calibration (GLUCOMETER DEX HIGH CONTROL) LIQD Test CBG's once daily.    . Blood Glucose Monitoring Suppl (TRUE METRIX AIR GLUCOSE METER) w/Device KIT Check BS daily Dx E11.9   . Cholecalciferol (VITAMIN D) 50 MCG (2000 UT) tablet Take 4,000 Units by mouth daily.   . ferrous sulfate 325 (65 FE) MG tablet Take 1 tablet (325 mg total) by mouth 2 (two) times daily with a meal. (Patient taking differently: Take 325 mg by mouth daily with breakfast.)   . furosemide (LASIX) 20 MG tablet Take 20 mg by mouth. bid   . glucose blood (TRUE METRIX BLOOD GLUCOSE TEST) test strip Check BS daily Dx E11.9   . lisinopril (ZESTRIL) 10 MG tablet Take 1  tablet by mouth once daily (Patient taking differently: Take 10 mg by mouth daily.)   . lovastatin (MEVACOR) 20 MG tablet Take 1 tablet (20 mg total) by mouth at bedtime.   . metFORMIN (GLUCOPHAGE) 1000 MG tablet Take 1 tablet (1,000 mg total) by mouth 2 (two) times daily with a meal.   . nadolol (CORGARD) 20 MG tablet Take 1 tablet (20 mg total) by mouth daily. 08/04/2020: No longer taking  . oxyCODONE-acetaminophen (  PERCOCET) 10-325 MG tablet Take 1 tablet by mouth every 6 (six) hours as needed for pain.   . OXYGEN Inhale 3 L into the lungs continuous.   . pantoprazole (PROTONIX) 40 MG tablet Take 1 tablet (40 mg total) by mouth daily.   . potassium chloride SA (KLOR-CON) 20 MEQ tablet Take 2 tablets (40 mEq total) by mouth 2 (two) times daily. (Patient taking differently: Take 40 mEq by mouth daily.)   . sodium chloride (OCEAN) 0.65 % SOLN nasal spray Place 1 spray into both nostrils as needed for congestion.   . torsemide (DEMADEX) 20 MG tablet Take 3 tablets (60 mg total) by mouth 2 (two) times daily. (Patient taking differently: Take 50 mg by mouth 2 (two) times daily.)   . TRUEplus Lancets 30G MISC Check BS daily Dx E11.9   . vitamin B-12 (CYANOCOBALAMIN) 1000 MCG tablet Take 2,000 mcg by mouth daily.   . [DISCONTINUED] albuterol (PROVENTIL HFA;VENTOLIN HFA) 108 (90 Base) MCG/ACT inhaler Inhale 2 puffs into the lungs every 6 (six) hours as needed for wheezing or shortness of breath.    No facility-administered encounter medications on file as of 08/06/2020.    ALLERGIES:  Allergies  Allergen Reactions  . Keflex [Cephalexin] Nausea And Vomiting     PHYSICAL EXAM:  ECOG PERFORMANCE STATUS: 2 - Symptomatic, <50% confined to bed  There were no vitals filed for this visit. There were no vitals filed for this visit. Physical Exam Constitutional:      Appearance: Normal appearance. She is obese.     Comments: Presents in wheelchair  HENT:     Head: Normocephalic and atraumatic.      Mouth/Throat:     Mouth: Mucous membranes are moist.  Eyes:     Extraocular Movements: Extraocular movements intact.     Pupils: Pupils are equal, round, and reactive to light.  Cardiovascular:     Rate and Rhythm: Normal rate and regular rhythm.     Pulses: Normal pulses.     Heart sounds: Normal heart sounds.  Pulmonary:     Effort: Pulmonary effort is normal.     Breath sounds: Normal breath sounds.  Abdominal:     General: Bowel sounds are normal.     Palpations: Abdomen is soft.     Tenderness: There is no abdominal tenderness.  Musculoskeletal:        General: No swelling.     Right lower leg: Edema (2+ pitting pretibial edema) present.     Left lower leg: Edema (2+ pitting pretibial edema) present.  Lymphadenopathy:     Cervical: No cervical adenopathy.  Skin:    General: Skin is warm and dry.  Neurological:     General: No focal deficit present.     Mental Status: She is alert and oriented to person, place, and time.  Psychiatric:        Mood and Affect: Mood normal.        Behavior: Behavior normal.      LABORATORY DATA:  I have reviewed the labs as listed.  CBC    Component Value Date/Time   WBC 3.9 (L) 08/04/2020 0915   RBC 3.20 (L) 08/04/2020 0915   HGB 8.9 (L) 08/04/2020 0915   HGB 7.8 (LL) 06/24/2020 1529   HCT 31.8 (L) 08/04/2020 0915   HCT 26.6 (L) 06/24/2020 1529   PLT 119 (L) 08/04/2020 0915   PLT 149 (L) 06/24/2020 1529   MCV 99.4 08/04/2020 0915   MCV 84 06/24/2020 1529  MCH 27.8 08/04/2020 0915   MCHC 28.0 (L) 08/04/2020 0915   RDW 23.6 (H) 08/04/2020 0915   RDW 18.4 (H) 06/24/2020 1529   LYMPHSABS 1.5 08/04/2020 0915   LYMPHSABS 1.8 06/24/2020 1529   MONOABS 0.4 08/04/2020 0915   EOSABS 0.0 08/04/2020 0915   EOSABS 0.1 06/24/2020 1529   BASOSABS 0.0 08/04/2020 0915   BASOSABS 0.0 06/24/2020 1529   CMP Latest Ref Rng & Units 07/09/2020 05/19/2020 05/17/2020  Glucose 70 - 99 mg/dL 100(H) 123(H) 115(H)  BUN 8 - 23 mg/dL 17 10 12    Creatinine 0.44 - 1.00 mg/dL 0.63 0.64 0.68  Sodium 135 - 145 mmol/L 137 134(L) 136  Potassium 3.5 - 5.1 mmol/L 3.6 3.6 4.1  Chloride 98 - 111 mmol/L 101 100 102  CO2 22 - 32 mmol/L 26 28 30   Calcium 8.9 - 10.3 mg/dL 8.5(L) 8.1(L) 8.1(L)  Total Protein 6.5 - 8.1 g/dL 8.6(H) 8.1 7.8  Total Bilirubin 0.3 - 1.2 mg/dL 0.3 0.6 0.3  Alkaline Phos 38 - 126 U/L 91 82 82  AST 15 - 41 U/L 24 25 23   ALT 0 - 44 U/L 16 15 14     DIAGNOSTIC IMAGING:  I have independently reviewed the relevant imaging and discussed with the patient.  ASSESSMENT: 1. Iron deficiency anemia in the setting of GI blood loss -She has hadthreehospital admissions for GI bleeding in the past year (October 2021, December 2021, February 2022) -Hgb was as low as3.7 (01/22/2020), and she has had8blood transfusions. -Before the GI bleeding started in October 2021, her hemoglobin was within normal range. -Extensive endoscopic work-up, but source of chronic anemia has yet to be identified.   Last EGD (07/28/2020) showed grade 1 esophageal varices and gastritis, as well as prominent blood vessels at the base of the tongue and underneath the tongue (question lingual varicosities)  Capsule endoscopy (07/28/2020) did not show any obvious lesions, erosions, or overt GI bleeding  Last colonoscopy (January 2022) showed nonbleeding internal hemorrhoids, diverticulosis in the sigmoid and descending colon, and polypectomy. -She has been referred to ENT to investigate potential lingual varicosities as the source of bleed  -Patient was started on oral ferrous sulfate by her PCP, but with little improvement, suspect poor absorption of iron as she is also on pantoprazole daily - Labs on 07/09/2020 show severe iron deficiency (iron 13, iron saturation 3%) as well as worsening anemia (Hgb 7.1) on 07/09/2020. Repeat CBC was therefore obtained on 07/16/2020, which showed that Hgb had dropped to 6.4, although patient reports that melena has resolved  - transfused RBC x1 in hematology clinic - Received IV Venofer 1000 mg in 3 divided doses, last given 07/23/2020  2.  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 LDH normal at 106. -Beta-2 microglobulin mildly elevated at 2.8, LDH normal at 106 - Skeletal survey (07/16/2020):  No lytic lesions - PET scan (08/04/2020): negative for any focal hypermetabolic activity in the skeleton, but did show mild diffuse marrow activity which is likely anemia marrow response after being given IV iron; no soft tissue plasmacytoma; no lytic lesions on CT portion of exam - Symptomatic with severe fatigue, no other B symptoms - Calcium (8.5) and creatinine (0.63) are within normal limits - Patient has profound anemia with Hgb  6.4 - unclear at this time if this is due to possible multiple myeloma, GI bleed, or both  3. 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  Borderline vitamin B12 deficiency - Borderline Vitamin B12 deficiency evidenced by B12 227 and methylmalonic acid 439 (elevated). Folate normal at 6.9.   PLAN:  1.  Iron deficiency anemia due to GI bleed - Continue follow-up with gastroenterology and ENT to find source of bleeding - Present to the emergency department if experiencing melena, hematochezia, or hemoptysis -  We will check every other week CBC/sample to blood bank until stabilized - Repeat CBC and iron panel in 3-week - Follow-up visit in 1 month, or sooner if  needed  2.  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  Vitamin B12 deficiency -  Continue taking daily vitamin B12 supplements (1000 mcg cyanocobalamin daily) - Recheck B12 at appointment  5.  Congestive heart failure - Mild fluid overload noted - patient reports that she did not take her Lasix yesterday or today - Explained importance of compliance with Lasix to the patient - She was informed to present to the emergency department if she has worsening dyspnea, shortness of breath, chest pain, or other concerning signs or symptoms   PLAN SUMMARY & DISPOSITION: - CBC every other week (instead of weekly) - Check lab panel on 08/27/2020 (CBC, ferritin, iron/TIBC, SPEP, light chains, LDH, CMP, B12) - Follow-up in 1 month (09/03/2020)  All questions were answered. The patient knows to call the clinic with any problems, questions or concerns.  Medical decision making: Moderate  Time spent on visit: I spent 20 minutes counseling the patient face to face. The total time spent in the appointment was 30 minutes and more than 50% was on counseling.   Harriett Rush, PA-C  08/06/20 9:22 AM

## 2020-08-07 NOTE — Progress Notes (Signed)
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  No further treatment is indicated at this time, but we discussed that there is significant risk for progression to multiple myeloma.  We will discuss further at her visit in June 2022.  She has no further questions or concerns at the time of this phone call.

## 2020-08-13 ENCOUNTER — Telehealth: Payer: Medicare PPO | Admitting: *Deleted

## 2020-08-14 ENCOUNTER — Encounter (HOSPITAL_COMMUNITY): Payer: Self-pay | Admitting: Hematology

## 2020-08-18 ENCOUNTER — Encounter (HOSPITAL_COMMUNITY): Payer: Self-pay | Admitting: Hematology

## 2020-08-20 ENCOUNTER — Inpatient Hospital Stay (HOSPITAL_COMMUNITY): Payer: Medicare PPO

## 2020-08-20 ENCOUNTER — Other Ambulatory Visit: Payer: Self-pay

## 2020-08-20 DIAGNOSIS — E538 Deficiency of other specified B group vitamins: Secondary | ICD-10-CM | POA: Diagnosis not present

## 2020-08-20 DIAGNOSIS — I509 Heart failure, unspecified: Secondary | ICD-10-CM | POA: Diagnosis not present

## 2020-08-20 DIAGNOSIS — D509 Iron deficiency anemia, unspecified: Secondary | ICD-10-CM | POA: Diagnosis not present

## 2020-08-20 DIAGNOSIS — D500 Iron deficiency anemia secondary to blood loss (chronic): Secondary | ICD-10-CM

## 2020-08-20 DIAGNOSIS — R0602 Shortness of breath: Secondary | ICD-10-CM | POA: Diagnosis not present

## 2020-08-20 DIAGNOSIS — E118 Type 2 diabetes mellitus with unspecified complications: Secondary | ICD-10-CM | POA: Diagnosis not present

## 2020-08-20 DIAGNOSIS — D7589 Other specified diseases of blood and blood-forming organs: Secondary | ICD-10-CM | POA: Diagnosis not present

## 2020-08-20 DIAGNOSIS — D472 Monoclonal gammopathy: Secondary | ICD-10-CM | POA: Diagnosis not present

## 2020-08-20 DIAGNOSIS — D3502 Benign neoplasm of left adrenal gland: Secondary | ICD-10-CM | POA: Diagnosis not present

## 2020-08-20 DIAGNOSIS — D696 Thrombocytopenia, unspecified: Secondary | ICD-10-CM | POA: Diagnosis not present

## 2020-08-20 LAB — CBC WITH DIFFERENTIAL/PLATELET
Abs Immature Granulocytes: 0.01 10*3/uL (ref 0.00–0.07)
Basophils Absolute: 0 10*3/uL (ref 0.0–0.1)
Basophils Relative: 1 %
Eosinophils Absolute: 0.1 10*3/uL (ref 0.0–0.5)
Eosinophils Relative: 1 %
HCT: 32 % — ABNORMAL LOW (ref 36.0–46.0)
Hemoglobin: 9.3 g/dL — ABNORMAL LOW (ref 12.0–15.0)
Immature Granulocytes: 0 %
Lymphocytes Relative: 25 %
Lymphs Abs: 1.6 10*3/uL (ref 0.7–4.0)
MCH: 28.7 pg (ref 26.0–34.0)
MCHC: 29.1 g/dL — ABNORMAL LOW (ref 30.0–36.0)
MCV: 98.8 fL (ref 80.0–100.0)
Monocytes Absolute: 0.7 10*3/uL (ref 0.1–1.0)
Monocytes Relative: 10 %
Neutro Abs: 4 10*3/uL (ref 1.7–7.7)
Neutrophils Relative %: 63 %
Platelets: 138 10*3/uL — ABNORMAL LOW (ref 150–400)
RBC: 3.24 MIL/uL — ABNORMAL LOW (ref 3.87–5.11)
RDW: 19.3 % — ABNORMAL HIGH (ref 11.5–15.5)
WBC: 6.4 10*3/uL (ref 4.0–10.5)
nRBC: 0 % (ref 0.0–0.2)

## 2020-08-21 ENCOUNTER — Ambulatory Visit: Payer: Medicare PPO | Admitting: *Deleted

## 2020-08-21 DIAGNOSIS — I10 Essential (primary) hypertension: Secondary | ICD-10-CM | POA: Diagnosis not present

## 2020-08-21 DIAGNOSIS — D500 Iron deficiency anemia secondary to blood loss (chronic): Secondary | ICD-10-CM | POA: Diagnosis not present

## 2020-08-21 DIAGNOSIS — F339 Major depressive disorder, recurrent, unspecified: Secondary | ICD-10-CM | POA: Diagnosis not present

## 2020-08-21 DIAGNOSIS — J449 Chronic obstructive pulmonary disease, unspecified: Secondary | ICD-10-CM | POA: Diagnosis not present

## 2020-08-21 DIAGNOSIS — Z789 Other specified health status: Secondary | ICD-10-CM

## 2020-08-21 DIAGNOSIS — I5032 Chronic diastolic (congestive) heart failure: Secondary | ICD-10-CM

## 2020-08-21 DIAGNOSIS — Z7409 Other reduced mobility: Secondary | ICD-10-CM

## 2020-08-21 DIAGNOSIS — E119 Type 2 diabetes mellitus without complications: Secondary | ICD-10-CM

## 2020-08-21 NOTE — Patient Instructions (Signed)
Visit Information  PATIENT GOALS: Goals Addressed            This Visit's Progress   . Anemia Improved   On track    Timeframe:  Short-Term Goal Priority:  High Start Date:                             Expected End Date:                       Follow-up: 09/14/20  . Keep all medical appointments . Seek appropriate medical attention for any new or worsening symptoms . set a daily activity goal . take a stool softener to prevent constipation . don't eat dairy products, such as milk, cheese and ice cream with those high in iron . eat 3 to 5 servings of fruits and vegetables each day . eat dark, leafy greens, such as spinach, chard and collards . include high-iron food, such as meat, chicken, fish, shellfish, dried fruit, beans and nuts in each meal . pair iron-rich foods with foods high in vitamin C, such as oranges, strawberries and red peppers to help absorption . Reach out to LCSW with any psychosocial concerns . Call RN Care Manager as needed [phone]    . Coping Skills Enhanced   On track    Timeframe:  Long-Range Goal Priority:  High Start Date:  07/31/20                           Expected End Date:  03/27/21                      Follow-up: 09/14/20  . Keep appointments for all medical appointments and tests . Talk with LCSW regarding anxiety, depression, psychosocial concerns, etc . Reach out to hematologist/oncologist with any questions or concerns regarding test results or testing directions . Get outside daily . Talk with friends/family regularly . Engage in an enjoyable activity everyday . Increase exercise and physical activity level as tolerated . Call RN Care Manager as needed [phone]     . Find Help in My Community       Timeframe:  Short-Term Goal Priority:  Medium Start Date:  08/21/20                           Expected End Date: 09/14/20                      Follow-up: 09/14/20  . Talk with Care Guides regarding what resources they can assist her  with . Call RN Care manager as needed [phone]       Patient verbalizes understanding of instructions provided today and agrees to view in [place].   Follow Up Plan:  . Telephone follow up appointment with care management team member scheduled for: 09/14/20 RNCM . The patient has been provided with contact information for the care management team and has been advised to call with any health related questions or concerns.  . Next PCP appointment scheduled for: 09/04/20 with Dr Lajuana Ripple  [name], BSN, RN-BC [place] / [place] Management Direct Dial: [phone]

## 2020-08-21 NOTE — Chronic Care Management (AMB) (Signed)
Chronic Care Management   CCM RN Visit Note  08/21/2020 Name: Savannah Burns MRN: [id number] DOB: 1951/03/10  Subjective: Savannah Burns is a 70 y.o. year old female who is a primary care patient of [name], DO. The care management team was consulted for assistance with disease management and care coordination needs.    Engaged with patient by telephone for follow up visit in response to provider referral for case management and/or care coordination services.   Consent to Services:  The patient was given information about Chronic Care Management services, agreed to services, and gave verbal consent prior to initiation of services.  Please see initial visit note for detailed documentation.   Patient agreed to services and verbal consent obtained.   Assessment: Review of patient past medical history, allergies, medications, health status, including review of consultants reports, laboratory and other test data, was performed as part of comprehensive evaluation and provision of chronic care management services.   SDOH (Social Determinants of Health) assessments and interventions performed:    CCM Care Plan  Allergies  Allergen Reactions  . Keflex [Cephalexin] Nausea And Vomiting    Outpatient Encounter Medications as of 08/21/2020  Medication Sig Note  . Blood Glucose Calibration (GLUCOMETER DEX HIGH CONTROL) LIQD Test CBG's once daily.    . Blood Glucose Monitoring Suppl (TRUE METRIX AIR GLUCOSE METER) w/Device KIT Check BS daily Dx E11.9   . Cholecalciferol (VITAMIN D) 50 MCG (2000 UT) tablet Take 4,000 Units by mouth daily.   . ferrous sulfate 325 (65 FE) MG tablet Take 1 tablet (325 mg total) by mouth 2 (two) times daily with a meal. (Patient taking differently: Take 325 mg by mouth daily with breakfast.)   . furosemide (LASIX) 20 MG tablet Take 20 mg by mouth. bid   . glucose blood (TRUE METRIX BLOOD GLUCOSE TEST) test strip Check BS daily Dx E11.9   .  lisinopril (ZESTRIL) 10 MG tablet Take 1 tablet by mouth once daily (Patient taking differently: Take 10 mg by mouth daily.)   . lovastatin (MEVACOR) 20 MG tablet Take 1 tablet (20 mg total) by mouth at bedtime.   . metFORMIN (GLUCOPHAGE) 1000 MG tablet Take 1 tablet (1,000 mg total) by mouth 2 (two) times daily with a meal.   . nadolol (CORGARD) 20 MG tablet Take 1 tablet (20 mg total) by mouth daily. 08/04/2020: No longer taking  . oxyCODONE-acetaminophen (PERCOCET) 10-325 MG tablet Take 1 tablet by mouth every 6 (six) hours as needed for pain.   . OXYGEN Inhale 3 L into the lungs continuous.   . pantoprazole (PROTONIX) 40 MG tablet Take 1 tablet (40 mg total) by mouth daily.   . potassium chloride SA (KLOR-CON) 20 MEQ tablet Take 2 tablets (40 mEq total) by mouth 2 (two) times daily. (Patient taking differently: Take 40 mEq by mouth daily.)   . sodium chloride (OCEAN) 0.65 % SOLN nasal spray Place 1 spray into both nostrils as needed for congestion.   . torsemide (DEMADEX) 20 MG tablet Take 3 tablets (60 mg total) by mouth 2 (two) times daily. (Patient taking differently: Take 50 mg by mouth 2 (two) times daily.)   . TRUEplus Lancets 30G MISC Check BS daily Dx E11.9   . vitamin B-12 (CYANOCOBALAMIN) 1000 MCG tablet Take 2,000 mcg by mouth daily.   . [DISCONTINUED] albuterol (PROVENTIL HFA;VENTOLIN HFA) 108 (90 Base) MCG/ACT inhaler Inhale 2 puffs into the lungs every 6 (six) hours as needed for wheezing or shortness  of breath.    No facility-administered encounter medications on file as of 08/21/2020.    Patient Active Problem List   Diagnosis Date Noted  . Hypoalbuminemia 05/17/2020  . Moderate protein malnutrition ([place]) 05/17/2020  . Melena 05/16/2020  . Hyperlipidemia   . Hypokalemia   . GI bleed 03/20/2020  . Lower GI bleed 03/19/2020  . Acute on chronic congestive heart failure ([place])   . Symptomatic anemia   . Iron deficiency anemia   . Heme positive stool   . ABLA (acute blood  loss anemia) 01/22/2020  . Supplemental oxygen dependent 10/22/2019  . Chronic dyspnea 10/22/2019  . Hypertension associated with diabetes ([place]) 10/22/2019  . Bilateral hand pain 09/21/2016  . Fatigue 07/30/2015  . Chronic diastolic HF (heart failure) ([place]) 06/29/2015  . Postmenopausal bleeding 04/17/2015  . Excessive daytime sleepiness 12/19/2014  . Swelling of lower extremity 11/27/2014  . Back pain 06/13/2013  . Sinusitis, chronic 05/30/2012  . Allergic rhinitis 06/06/2011  . Hyperlipidemia associated with type 2 diabetes mellitus ([place]) 03/24/2009  . Diabetes mellitus, type 2 ([place]) 08/12/2008  . Pulmonary nodule 08/29/2007  . COPD (chronic obstructive pulmonary disease) ([place]) 02/14/2007  . Class 3 obesity 05/25/2006  . TOBACCO DEPENDENCE 05/25/2006  . Depression, recurrent ([place]) 05/25/2006  . HYPERTENSION, BENIGN SYSTEMIC 05/25/2006  . DJD, UNSPECIFIED 05/25/2006    Conditions to be addressed/monitored:CHF, DMII, Depression and anemia  Care Plan : RNCM: Iron Deficiency (Adult)  Updates made by Ilean China, RN since 08/21/2020 12:00 AM    Problem: Iron Deficiency   Priority: High  Note:   Iron deficiency anemia in a patient with depression, Obesity, DM, HTN, COPD, DJD, HLD, CHF    Long-Range Goal: Anemia Management   Start Date: 02/28/2020  This Visit's Progress: On track  Recent Progress: On track  Priority: High  Note:   Objective: Lab Results  Component Value Date   WBC 6.4 08/20/2020   HGB 9.3 (L) 08/20/2020   HCT 32.0 (L) 08/20/2020   MCV 98.8 08/20/2020   PLT 138 (L) 08/20/2020   Lab Results  Component Value Date   IRON 16 (L) 07/09/2020   TIBC 507 (H) 07/09/2020   FERRITIN 13 07/09/2020   Current Barriers:  . Chronic Disease Management support and education needs related to anemia in a patient with Depression, Obesity, DM, HTN, COPD, DJD, HLD, CHF, hepatic cirrhosis  . Cause of anemia unkown . Financial constraints . Stress of chronic disease and  process of testing for new diagnosis  Nurse Case Manager Clinical Goal(s):  Marland Kitchen Patient will follow-up with gastroenterologist to discuss plan for anemia management . Patient will follow-up with hematologist to discuss plan for anemia management . Patient will demonstrate ongoing health maintenance behavior by keeping all medical appointments . Patient will talk with [facility] regarding self-management of anemia  Interventions:  . Inter-disciplinary care team collaboration (see longitudinal plan of care) . Chart reviewed including recent office notes, surgical reports, pathology reports, and lab results o Discussed upward trend in Hgb . Reviewed and discussed medications and importance of compliance . Advised to take iron with a source of vitamin C and to take it with food it makes her nauseas . Recommended stool softener with iron if constipation occurs . Discussed iron infusions. They are on hold for now.  . Discussed results of recent colonoscopies, endoscopies, and xray . Encouraged to seek appropriate medical attention with any new or worsening symptoms  . Reinforced an iron rich diet . Reviewed upcoming appointments .  Therapeutic listening utilized . Encouraged patient to talk with LCSW regarding psychosocial concerns . Encouraged patient to reach out to [facility] as needed . Advised patient to seek emergency medical attention if necessary  Patient Self Care Activities:  . Self administers medications as prescribed . Call provider office for new concerns or questions . Able to perform ADLs and IADLs independently  Patient Goals: Over the next 30 days, patient will: . Keep all medical appointments . Seek appropriate medical attention for any new or worsening symptoms . set a daily activity goal . take a stool softener to prevent constipation . don't eat dairy products, such as milk, cheese and ice cream with those high in iron . eat 3 to 5 servings of fruits and vegetables  each day . eat dark, leafy greens, such as spinach, chard and collards . include high-iron food, such as meat, chicken, fish, shellfish, dried fruit, beans and nuts in each meal . pair iron-rich foods with foods high in vitamin C, such as oranges, strawberries and red peppers to help absorption . Reach out to LCSW with any psychosocial concerns . Call RN Care Manager as needed [phone]  Follow Up Plan:  . Telephone follow up appointment with care management team member scheduled for: 09/14/20  with RNCM . The patient has been provided with contact information for the care management team and has been advised to call with any health related questions or concerns.  . Next PCP appointment scheduled for: 09/04/20 with Dr Lajuana Ripple   Care Plan : RNCM: General Plan of Care (Adult)  Updates made by Ilean China, RN since 08/21/2020 12:00 AM    Problem: Coping Skills (General Plan of Care)     Long-Range Goal: Coping Skills Enhanced   Start Date: 07/31/2020  This Visit's Progress: On track  Recent Progress: On track  Priority: High  Note:   Current Barriers:  Marland Kitchen Knowledge Deficits related to cause of anemia and and additional tests needed to identify a cause . Care Coordination needs related to abnormal lab results in a patient with Depression, Obesity, DM, HTN, COPD, DJD, HLD, CHF, iron deficiency anemia, hepatic cirrhosis   Nurse Case Manager Clinical Goal(s):  . patient will work with hematologist/oncologist to address needs related to anemia and other abnormal lab values . patient will meet with RN Care Manager to address coping skills regarding chronic conditions and new diagnoses  Interventions:  . 1:1 collaboration with Claretta Fraise, MD regarding development and update of comprehensive plan of care as evidenced by provider attestation and co-signature . Inter-disciplinary care team collaboration (see longitudinal plan of care) . Evaluation of current treatment plan related to  anemia and abnormal lab results  and patient's adherence to plan as established by provider. . Chart reviewed including recent office notes, lab results, imaging reports . Previously collaborated with hematologist/oncologist regarding lab results and potential diagnosis of multiple myeloma   . Discussed patient's current mental state and outlook on health and treatment plan o She feels much better and more positive than she did when she was first diagnosed with anemia o sh . Therapeutic listening utilized . Discussed recent tests, upcoming appointments, and upcoming tests . Assessed patient's understanding of future tests and knowledge of location, pre-procedure instructions, and check-in procedure . Assessed patient's willingness to participate in tests and treatment options  Acknowledged difficulty of making life-long lifestyle changes   Assessed coping strategies  Discussed family/social support  Encourage to continue talking with LCSW and to reach  out as needed  Encouraged participation in pleasurable group activities such as hobbies, singing, sports or volunteering   Promoted a regular daily exercise program based on tolerance, ability and patient choice to support positive thinking about disease and potential diagnosis  Provided with [place] contact number and encouraged to reach out as needed  Self Care Activities:  . Self administers medications as prescribed . Calls pharmacy for medication refills . Performs ADL's independently . Calls provider office for new concerns or questions  Patient Goals Over the next 30 days, patient will: . Keep appointments for all medical appointments and tests . Talk with LCSW regarding anxiety, depression, psychosocial concerns, etc . Reach out to hematologist/oncologist with any questions or concerns regarding test results or testing directions . Get outside daily . Talk with friends/family regularly . Engage in an enjoyable  activity everyday . Increase exercise and physical activity level as tolerated . Call RN Care Manager as needed [phone]  Follow Up Plan:  . Telephone follow up appointment with care management team member scheduled for: 09/14/20 with RNCM . The patient has been provided with contact information for the care management team and has been advised to call with any health related questions or concerns.  . Next PCP appointment scheduled for: 09/04/20 with Dr Lajuana Ripple    Problem: Quality of Life (General Plan of Care)     Goal: Find Help in the Community   Start Date: 08/21/2020  This Visit's Progress: Not on track  Priority: Medium  Note:   Current Barriers:  Marland Kitchen Knowledge Deficits related to food and home improvement resources . Film/video editor.  Karen Kays to prepare meals  Nurse Case Manager Clinical Goal(s):  . patient will work with [place] to address needs related to need for food and home improvement resources  Interventions:  . 1:1 collaboration with [name], DO regarding development and update of comprehensive plan of care as evidenced by provider attestation and co-signature . Inter-disciplinary care team collaboration (see longitudinal plan of care) . Chart reviewed including relevant office notes, correspondence notes, lab results, and imaging reports . Reviewed medications with patient and discussed importance of medication adherence . Discussed family and social support o Granddaughter lives with her and prepares her meals but she isn't sure that she will be able to continue o Would like information on Meals on Wheels or other programs that deliver food . Discussed housing o Living room floor is sinking and needs replacing. Can get materials but needs someone to install them.  . Referral placed to [facility] to talk with patient about any available resources that they may have . Discussed plans with patient for ongoing care management follow  up and provided patient with direct contact information for care management team  Self Care Activities:  . Self administers medications as prescribed . Attends all scheduled provider appointments . Calls provider office for new concerns or questions  Patient Goals Over the next 30 days, patient will: . Talk with Care Guides regarding what resources they can assist her with . Call RN Care manager as needed [phone]    Follow Up Plan:  . Telephone follow up appointment with care management team member scheduled for: 09/14/20 RNCM . The patient has been provided with contact information for the care management team and has been advised to call with any health related questions or concerns.  . Next PCP appointment scheduled for: 09/04/20 with Dr Lajuana Ripple  [name], BSN, RN-BC [facility]  Chronic Care Manager [place] / [place] Management Direct Dial: [phone]

## 2020-08-23 DIAGNOSIS — J449 Chronic obstructive pulmonary disease, unspecified: Secondary | ICD-10-CM | POA: Diagnosis not present

## 2020-08-25 ENCOUNTER — Telehealth: Payer: Self-pay | Admitting: *Deleted

## 2020-08-25 NOTE — Telephone Encounter (Signed)
   Telephone encounter was:  Unsuccessful.  08/25/2020 Name: Savannah Burns MRN: [id number] DOB: 22-Mar-1951  Unsuccessful outbound call made today to assist with:  Transportation Needs , Food Insecurity and Home Modifications  Outreach Attempt:  1st Attempt  A HIPAA compliant voice message was left requesting a return call.  Instructed patient to call back at [place] , [place], Care Management  [phone] [address] , [place] [address] Email : [email or web address]

## 2020-08-27 ENCOUNTER — Inpatient Hospital Stay (HOSPITAL_COMMUNITY): Payer: Medicare PPO | Attending: Hematology

## 2020-08-27 DIAGNOSIS — E538 Deficiency of other specified B group vitamins: Secondary | ICD-10-CM | POA: Insufficient documentation

## 2020-08-27 DIAGNOSIS — R0602 Shortness of breath: Secondary | ICD-10-CM | POA: Insufficient documentation

## 2020-08-27 DIAGNOSIS — Z7984 Long term (current) use of oral hypoglycemic drugs: Secondary | ICD-10-CM | POA: Insufficient documentation

## 2020-08-27 DIAGNOSIS — D509 Iron deficiency anemia, unspecified: Secondary | ICD-10-CM | POA: Insufficient documentation

## 2020-08-27 DIAGNOSIS — E785 Hyperlipidemia, unspecified: Secondary | ICD-10-CM | POA: Insufficient documentation

## 2020-08-27 DIAGNOSIS — I110 Hypertensive heart disease with heart failure: Secondary | ICD-10-CM | POA: Insufficient documentation

## 2020-08-27 DIAGNOSIS — Z79899 Other long term (current) drug therapy: Secondary | ICD-10-CM | POA: Insufficient documentation

## 2020-08-27 DIAGNOSIS — I509 Heart failure, unspecified: Secondary | ICD-10-CM | POA: Insufficient documentation

## 2020-08-27 DIAGNOSIS — Z85828 Personal history of other malignant neoplasm of skin: Secondary | ICD-10-CM | POA: Insufficient documentation

## 2020-08-27 DIAGNOSIS — C9000 Multiple myeloma not having achieved remission: Secondary | ICD-10-CM | POA: Insufficient documentation

## 2020-08-27 DIAGNOSIS — E119 Type 2 diabetes mellitus without complications: Secondary | ICD-10-CM | POA: Insufficient documentation

## 2020-08-28 DIAGNOSIS — G894 Chronic pain syndrome: Secondary | ICD-10-CM | POA: Diagnosis not present

## 2020-08-28 DIAGNOSIS — M5136 Other intervertebral disc degeneration, lumbar region: Secondary | ICD-10-CM | POA: Diagnosis not present

## 2020-08-28 DIAGNOSIS — Z9189 Other specified personal risk factors, not elsewhere classified: Secondary | ICD-10-CM | POA: Diagnosis not present

## 2020-08-28 DIAGNOSIS — Z79899 Other long term (current) drug therapy: Secondary | ICD-10-CM | POA: Diagnosis not present

## 2020-09-02 ENCOUNTER — Telehealth: Payer: Self-pay | Admitting: *Deleted

## 2020-09-02 NOTE — Telephone Encounter (Signed)
.    Telephone encounter was:  Unsuccessful.  09/02/2020 Name: Savannah Burns MRN: [id number] DOB: 04-29-1950  Unsuccessful outbound call made today to assist with:  Transportation Needs , Food Insecurity and Caregiver Stress  Outreach Attempt:  2nd Attempt  A HIPAA compliant voice message was left requesting a return call.  Instructed patient to call back at   Instructed patient to call back at [phone]  at their earliest convenience.  [place], Care Management  [phone] [address] , [place] [address] Email : [email or web address]

## 2020-09-03 ENCOUNTER — Ambulatory Visit (HOSPITAL_COMMUNITY): Payer: Medicare PPO | Admitting: Physician Assistant

## 2020-09-04 ENCOUNTER — Telehealth: Payer: Self-pay | Admitting: *Deleted

## 2020-09-04 ENCOUNTER — Encounter: Payer: Self-pay | Admitting: Family Medicine

## 2020-09-04 ENCOUNTER — Ambulatory Visit: Payer: Medicare PPO | Admitting: Family Medicine

## 2020-09-04 ENCOUNTER — Other Ambulatory Visit: Payer: Self-pay

## 2020-09-04 VITALS — BP 126/64 | HR 90 | Temp 98.4°F

## 2020-09-04 DIAGNOSIS — L304 Erythema intertrigo: Secondary | ICD-10-CM | POA: Diagnosis not present

## 2020-09-04 DIAGNOSIS — J449 Chronic obstructive pulmonary disease, unspecified: Secondary | ICD-10-CM

## 2020-09-04 DIAGNOSIS — R0902 Hypoxemia: Secondary | ICD-10-CM | POA: Diagnosis not present

## 2020-09-04 DIAGNOSIS — E1169 Type 2 diabetes mellitus with other specified complication: Secondary | ICD-10-CM | POA: Diagnosis not present

## 2020-09-04 DIAGNOSIS — E1159 Type 2 diabetes mellitus with other circulatory complications: Secondary | ICD-10-CM | POA: Diagnosis not present

## 2020-09-04 DIAGNOSIS — Z23 Encounter for immunization: Secondary | ICD-10-CM

## 2020-09-04 DIAGNOSIS — D500 Iron deficiency anemia secondary to blood loss (chronic): Secondary | ICD-10-CM

## 2020-09-04 DIAGNOSIS — Z9981 Dependence on supplemental oxygen: Secondary | ICD-10-CM

## 2020-09-04 DIAGNOSIS — E785 Hyperlipidemia, unspecified: Secondary | ICD-10-CM

## 2020-09-04 DIAGNOSIS — D472 Monoclonal gammopathy: Secondary | ICD-10-CM | POA: Diagnosis not present

## 2020-09-04 DIAGNOSIS — I152 Hypertension secondary to endocrine disorders: Secondary | ICD-10-CM

## 2020-09-04 DIAGNOSIS — F331 Major depressive disorder, recurrent, moderate: Secondary | ICD-10-CM

## 2020-09-04 LAB — BAYER DCA HB A1C WAIVED: HB A1C (BAYER DCA - WAIVED): 5.3 % (ref <7.0)

## 2020-09-04 MED ORDER — METFORMIN HCL 1000 MG PO TABS: 1000.0000 mg | ORAL_TABLET | Freq: Every day | ORAL | 3 refills | Status: DC

## 2020-09-04 MED ORDER — ESCITALOPRAM OXALATE 10 MG PO TABS: 10.0000 mg | ORAL_TABLET | Freq: Every day | ORAL | 3 refills | Status: DC

## 2020-09-04 MED ORDER — NYSTATIN 100000 UNIT/GM EX POWD: 1.0000 "application " | Freq: Three times a day (TID) | CUTANEOUS | 1 refills | Status: DC

## 2020-09-04 NOTE — Patient Instructions (Signed)
Lexapro restarted at 86m dose since your health has changed significantly from a year ago.  Reduce Metformin to 1 tablet daily only.  If A1c still below 6.0 next visit, will discontinue totally.  Nystatin powder to affected area under abdomen 3 times daily as needed for rash.  Taking the medicine as directed and not missing any doses is one of the best things you can do to treat your depression.  Here are some things to keep in mind:  Side effects (stomach upset, some increased anxiety) may happen before you notice a benefit.  These side effects typically go away over time. Changes to your dose of medicine or a change in medication all together is sometimes necessary Most people need to be on medication at least 12 months Many people will notice an improvement within two weeks but the full effect of the medication can take up to 4-6 weeks Stopping the medication when you start feeling better often results in a return of symptoms Never discontinue your medication without contacting a health care professional first.  Some medications require gradual discontinuation/ taper and can make you sick if you stop them abruptly.  If your symptoms worsen or you have thoughts of suicide/homicide, PLEASE SEEK IMMEDIATE MEDICAL ATTENTION.  You may always call:  National Suicide Hotline: 8671-718-9381[place] 3609-658-2465[facility] in [place] 8754-698-5218  These are available 24 hours a day, 7 days a week. Intertrigo Intertrigo is skin irritation (inflammation) that happens in warm, moist areas of the body. The irritation can cause a rash and make skin raw and itchy. The rash is usually pink or red. It happens mostly between folds of skin or where skin rubs together, such as: Between the toes. In the armpits. In the groin area. Under the belly. Under the breasts. Around the butt area. This condition is not passed from person to person (is not contagious). What are  the causes? Heat, moisture, rubbing, and not enough air movement. The condition can be made worse by: Sweat. Bacteria. A fungus, such as yeast. What increases the risk? Moisture in your skin folds. You are more likely to develop this condition if you: Have diabetes. Are overweight. Are not able to move around. Live in a warm and moist climate. Wear splints, braces, or other medical devices. Are not able to control your pee (urine) or poop (stool). What are the signs or symptoms? A pink or red skin rash in the skin fold or near the skin fold. Raw or scaly skin. Itching. A burning feeling. Bleeding. Leaking fluid. A bad smell. How is this treated? Cleaning and drying your skin. Taking an antibiotic medicine or using an antibiotic skin cream for a bacterial infection. Using an antifungal cream on your skin or taking pills for an infection that was caused by a fungus, such as yeast. Using a steroid ointment to stop the itching and irritation. Separating the skin fold with a clean cotton cloth to absorb moisture and allow air to flow into the area. Follow these instructions at home: Keep the affected area clean and dry. Do not scratch your skin. Stay cool as much as you can. Use an air conditioner or a fan, if you have one. Apply over-the-counter and prescription medicines only as told by your doctor. If you were prescribed an antibiotic medicine, use it as told by your doctor. Do not stop using the antibiotic even if your condition starts to get better. Keep all follow-up visits as told by  your doctor. This is important. How is this prevented?  Stay at a healthy weight. Take care of your feet. This is very important if you have diabetes. You should: Wear shoes that fit well. Keep your feet dry. Wear clean cotton or wool socks. Protect the skin in your groin and butt area as told by your doctor. To do this: Follow a regular cleaning routine. Use creams, powders, or ointments  that protect your skin. Change protection pads often. Do not wear tight clothes. Wear clothes that: Are loose. Take moisture away from your body. Are made of cotton. Wear a bra that gives good support, if needed. Shower and dry yourself well after being active. Use a hair dryer on a cool setting to dry between skin folds. Keep your blood sugar under control if you have diabetes. Contact a doctor if: Your symptoms do not get better with treatment. Your symptoms get worse or they spread. You notice more redness and warmth. You have a fever. Summary Intertrigo is skin irritation that occurs when folds of skin rub together. This condition is caused by heat, moisture, and rubbing. This condition may be treated by cleaning and drying your skin and with medicines. Apply over-the-counter and prescription medicines only as told by your doctor. Keep all follow-up visits as told by your doctor. This is important. This information is not intended to replace advice given to you by your health care provider. Make sure you discuss any questions you have with your healthcare provider. Document Revised: 12/21/2017 Document Reviewed: 12/21/2017 Elsevier Patient Education  2022 [employer].

## 2020-09-04 NOTE — Telephone Encounter (Signed)
   Telephone encounter was:  Unsuccessful.  09/04/2020 Name: Savannah Burns MRN: [id number] DOB: 1950-08-17  Unsuccessful outbound call made today to assist with:  Transportation Needs , Food Insecurity, and Home Modifications  Outreach Attempt:  3rd Attempt.  Referral closed unable to contact patient.  A HIPAA compliant voice message was left requesting a return call.  Instructed patient to call back at   Instructed patient to call back at [phone]  at their earliest convenience.      [place], Care Management  [phone] [address] , [place] [address] Email : [email or web address]

## 2020-09-04 NOTE — Progress Notes (Signed)
Subjective: CC: DM PCP: [name], DO ZPH:[id number] Savannah Burns is a 70 y.o. female presenting to clinic today for:  1. Type 2 Diabetes with hypertension, hyperlipidemia:  Does not monitor blood sugar or blood pressure.  She is intermittently compliant with lisinopril 10 mg daily, Mevacor 20 mg daily and metformin 100 mg twice daily.  She admits that she has not taken her diuretics in the last couple of days.  She had fast food for lunch today but notes that this is not her norm.  Overall she has been decreasing her portion sizes and had subsequent weight loss.  She is still trying to continue weight loss but notes that mobility is limited secondary to dyspnea and chronic pain.  Last eye exam: Needs Last foot exam: Needs Last A1c:  Lab Results  Component Value Date   HGBA1C 6.3 (H) 01/22/2020   Nephropathy screen indicated?:  On ACE inhibitor Last flu, zoster and/or pneumovax:  Immunization History  Administered Date(s) Administered   Hepatitis B 05/30/1991, 06/27/1991, 12/04/1991   Influenza Whole 01/04/2008, 03/24/2009   Influenza, High Dose Seasonal PF 01/17/2018, 12/29/2018   Influenza,inj,Quad PF,6+ Mos 11/27/2014   Influenza-Unspecified 01/26/2013, 03/01/2014   Pneumococcal Conjugate-13 03/01/2014   Pneumococcal Polysaccharide-23 09/08/2015   Td 05/27/2002   Zoster, Live 02/14/2013    2. Iron deficiency anemia due to chronic blood loss/ MGUS Being managed with q. 2-week hemoglobin checks and as needed infusions/transfusions.  She reports that energy has improved since hemoglobin has come up but is still not at her baseline.  She continues to get dyspneic on exertion as above.  No reports of any bleeding as of late.  She is had extensive work-up by GI and there is not been anything that they could pinpoint for the pronounced anemia that was demonstrated on laboratory work-up previously.  3.  Depressive disorder Patient reports that she used to be treated with  depression medicine and thinks that she needs to go back on it.  She admits that she has been having increased depressive symptoms such that she feels a pit in her stomach.  She had severe intolerance to Zoloft in the past but did well on Lexapro would like to go back on that.   ROS: Per HPI  Allergies  Allergen Reactions   Keflex [Cephalexin] Nausea And Vomiting   Past Medical History:  Diagnosis Date   Adrenal adenoma, left    Stable   Anxiety    Arthritis    bilateral hands   Depression    Diabetes mellitus, type 2 ([place]) 08/12/2008   Qualifier: Diagnosis of  By: Deborra Medina MD, Talia     Dyspnea    Esophageal varices (HCC)    Grade II diastolic dysfunction    History of kidney stones    Hyperlipidemia    Hypertension    Lower back pain    Lower GI bleed 03/19/2020   Panic attacks    Pneumonia    currently taking antibiotic and prednisone for early stages of pneumonia   Pulmonary nodules    bilateral   Skin cancer    face    Current Outpatient Medications:    Blood Glucose Calibration (GLUCOMETER DEX HIGH CONTROL) LIQD, Test CBG's once daily. , Disp: , Rfl:    Blood Glucose Monitoring Suppl (TRUE METRIX AIR GLUCOSE METER) w/Device KIT, Check BS daily Dx E11.9, Disp: 1 kit, Rfl: 0   Cholecalciferol (VITAMIN D) 50 MCG (2000 UT) tablet, Take 4,000 Units by mouth daily., Disp: ,  Rfl:    furosemide (LASIX) 20 MG tablet, Take 20 mg by mouth. bid, Disp: , Rfl:    glucose blood (TRUE METRIX BLOOD GLUCOSE TEST) test strip, Check BS daily Dx E11.9, Disp: 100 each, Rfl: 3   lisinopril (ZESTRIL) 10 MG tablet, Take 1 tablet by mouth once daily (Patient taking differently: Take 10 mg by mouth daily.), Disp: 90 tablet, Rfl: 0   lovastatin (MEVACOR) 20 MG tablet, Take 1 tablet (20 mg total) by mouth at bedtime., Disp: 90 tablet, Rfl: 3   metFORMIN (GLUCOPHAGE) 1000 MG tablet, Take 1 tablet (1,000 mg total) by mouth 2 (two) times daily with a meal., Disp: 180 tablet, Rfl: 3   nadolol (CORGARD)  20 MG tablet, Take 1 tablet (20 mg total) by mouth daily., Disp: 30 tablet, Rfl: 3   oxyCODONE-acetaminophen (PERCOCET) 10-325 MG tablet, Take 1 tablet by mouth every 6 (six) hours as needed for pain., Disp: , Rfl:    OXYGEN, Inhale 3 L into the lungs continuous., Disp: , Rfl:    pantoprazole (PROTONIX) 40 MG tablet, Take 1 tablet (40 mg total) by mouth daily., Disp: 30 tablet, Rfl: 5   potassium chloride SA (KLOR-CON) 20 MEQ tablet, Take 2 tablets (40 mEq total) by mouth 2 (two) times daily. (Patient taking differently: Take 40 mEq by mouth daily.), Disp: 360 tablet, Rfl: 3   TRUEplus Lancets 30G MISC, Check BS daily Dx E11.9, Disp: 100 each, Rfl: 3   vitamin B-12 (CYANOCOBALAMIN) 1000 MCG tablet, Take 2,000 mcg by mouth daily., Disp: , Rfl:    ferrous sulfate 325 (65 FE) MG tablet, Take 1 tablet (325 mg total) by mouth 2 (two) times daily with a meal. (Patient taking differently: Take 325 mg by mouth daily with breakfast.), Disp: 60 tablet, Rfl: 1   torsemide (DEMADEX) 20 MG tablet, Take 3 tablets (60 mg total) by mouth 2 (two) times daily. (Patient taking differently: Take 50 mg by mouth 2 (two) times daily.), Disp: 180 tablet, Rfl: 11 Social History   Socioeconomic History   Marital status: Widowed    Spouse name: Not on file   Number of children: 2   Years of education: 14   Highest education level: Not on file  Occupational History   Occupation: Retired   Tobacco Use   Smoking status: Former    Packs/day: 1.50    Years: 40.00    Pack years: 60.00    Types: Cigarettes    Quit date: 04/29/2015    Years since quitting: 5.3   Smokeless tobacco: Never   Tobacco comments:    Quit smoking 04/2015- Previous 1.5 ppd smoker  Vaping Use   Vaping Use: Never used  Substance and Sexual Activity   Alcohol use: No    Alcohol/week: 0.0 standard drinks   Drug use: No   Sexual activity: Not Currently    Birth control/protection: Post-menopausal  Other Topics Concern   Not on file  Social  History Narrative   Her 50 year old granddaughter lives with her - one daughter lives nearby, but she doesn't have a good relationship with her. Has a great relationship with other daughter who lives 1.5 hrs away - talks to her daily on the phone.   Social Determinants of Health   Financial Resource Strain: Low Risk    Difficulty of Paying Living Expenses: Not very hard  Food Insecurity: No Food Insecurity   Worried About Running Out of Food in the Last Year: Never true   Ran Out  of Food in the Last Year: Never true  Transportation Needs: No Transportation Needs   Lack of Transportation (Medical): No   Lack of Transportation (Non-Medical): No  Physical Activity: Inactive   Days of Exercise per Week: 0 days   Minutes of Exercise per Session: 0 min  Stress: No Stress Concern Present   Feeling of Stress : Not at all  Social Connections: Socially Isolated   Frequency of Communication with Friends and Family: More than three times a week   Frequency of Social Gatherings with Friends and Family: Once a week   Attends Religious Services: Never   Marine scientist or Organizations: No   Attends Archivist Meetings: Never   Marital Status: Widowed  Human resources officer Violence: Not At Risk   Fear of Current or Ex-Partner: No   Emotionally Abused: No   Physically Abused: No   Sexually Abused: No   Family History  Problem Relation Age of Onset   Diabetes Father    Heart disease Father 87       MI   Hypertension Father    Anemia Mother        Transfusion dependent   COPD Sister    Cancer Paternal Grandmother 77       Pancreatic    Objective: Office vital signs reviewed. BP 126/64   Pulse 90   Temp 98.4 F (36.9 C) (Temporal)   SpO2 91%   Physical Examination:  General: Awake, alert, morbidly obese, No acute distress HEENT: Normal, sclera white Cardio: regular rate and rhythm, S1S2 heard, no murmurs appreciated Pulm: clear to auscultation bilaterally, no  wheezes, rhonchi or rales; becomes dyspneic on exertion.  Normal work of breathing at rest on 3 L supplemental oxygen. GI: soft, non-tender, non-distended, bowel sounds present x4, no hepatomegaly, no splenomegaly, no masses MSK: Needs assistance for ambulation but arrives in wheelchair.  Becomes easily dyspneic on exertion Skin: dry; intact; no rashes or lesions Psych: Becomes somewhat tearful during exam.  She is otherwise pleasant and interactive.  Thought process is linear.  Good eye contact. Neuro: see DM foot Diabetic Foot Exam - Simple   Simple Foot Form Diabetic Foot exam was performed with the following findings: Yes 09/04/2020  3:18 PM  Visual Inspection No deformities, no ulcerations, no other skin breakdown bilaterally: Yes Sensation Testing Intact to touch and monofilament testing bilaterally: Yes Pulse Check Posterior Tibialis and Dorsalis pulse intact bilaterally: Yes Comments Pedal edema present    Depression screen [facility] 2/9 08/03/2020 07/24/2020 06/24/2020  Decreased Interest 0 0 0  Down, Depressed, Hopeless 0 0 0  PHQ - 2 Score 0 0 0  Altered sleeping 1 - -  Tired, decreased energy 1 - -  Change in appetite 0 - -  Feeling bad or failure about yourself  0 - -  Trouble concentrating 1 - -  Moving slowly or fidgety/restless 1 - -  Suicidal thoughts 0 - -  PHQ-9 Score 4 - -  Difficult doing work/chores Somewhat difficult - -  Some recent data might be hidden   GAD 7 : Generalized Anxiety Score 12/05/2019 12/22/2017  Nervous, Anxious, on Edge 1 1  Control/stop worrying 1 3  Worry too much - different things 1 1  Trouble relaxing 0 1  Restless 0 0  Easily annoyed or irritable 1 1  Afraid - awful might happen 0 3  Total GAD 7 Score 4 10  Anxiety Difficulty Somewhat difficult Somewhat difficult    Assessment/ Plan:  70 y.o. female   Type 2 diabetes mellitus with other specified complication, without long-term current use of insulin (HCC) - Plan: Bayer DCA Hb A1c  Waived, metFORMIN (GLUCOPHAGE) 1000 MG tablet  Hyperlipidemia associated with type 2 diabetes mellitus ([place])  Hypertension associated with diabetes ([place])  Iron deficiency anemia due to chronic blood loss  MGUS (monoclonal gammopathy of unknown significance)  Supplemental oxygen dependent - Plan: CANCELED: For home use only DME oxygen  Hypoxia - Plan: CANCELED: For home use only DME oxygen  Chronic obstructive pulmonary disease, unspecified COPD type ([place]) - Plan: CANCELED: For home use only DME oxygen  Moderate episode of recurrent major depressive disorder ([place] [employer] [place]) - Plan: escitalopram (LEXAPRO) 10 MG tablet  Intertrigo - Plan: nystatin powder  Diabetes under excellent control.  We may de-escalate her metformin to 1000 mg daily.  If A1c remains below 6 cannot totally discontinue metformin at next visit  Continue statin  Blood pressure well controlled.  No changes  I reviewed her last notes by hematology/oncology.  Continue with every 2 week checkups and as needed infusions  Patient is stable on 3 L of supplemental oxygen.  Depressive disorder is recurrent and therefore we will reinitiate Lexapro.  I reinitiated at 10 mg given age.  We would like to really check her in the next 2 to 3 months, sooner if needed  Treat intertrigo with nystatin powder.  Handout provided.  Advised to keep area dry and clean  Orders Placed This Encounter  Procedures   Bayer DCA Hb A1c Waived   No orders of the defined types were placed in this encounter.   [name], DO [place] [phone]

## 2020-09-10 ENCOUNTER — Telehealth: Payer: Self-pay

## 2020-09-10 ENCOUNTER — Ambulatory Visit (INDEPENDENT_AMBULATORY_CARE_PROVIDER_SITE_OTHER): Payer: Medicare PPO | Admitting: Licensed Clinical Social Worker

## 2020-09-10 DIAGNOSIS — E1159 Type 2 diabetes mellitus with other circulatory complications: Secondary | ICD-10-CM | POA: Diagnosis not present

## 2020-09-10 DIAGNOSIS — D500 Iron deficiency anemia secondary to blood loss (chronic): Secondary | ICD-10-CM

## 2020-09-10 DIAGNOSIS — J449 Chronic obstructive pulmonary disease, unspecified: Secondary | ICD-10-CM | POA: Diagnosis not present

## 2020-09-10 DIAGNOSIS — I152 Hypertension secondary to endocrine disorders: Secondary | ICD-10-CM | POA: Diagnosis not present

## 2020-09-10 DIAGNOSIS — D649 Anemia, unspecified: Secondary | ICD-10-CM | POA: Diagnosis not present

## 2020-09-10 DIAGNOSIS — E1169 Type 2 diabetes mellitus with other specified complication: Secondary | ICD-10-CM

## 2020-09-10 DIAGNOSIS — F339 Major depressive disorder, recurrent, unspecified: Secondary | ICD-10-CM

## 2020-09-10 DIAGNOSIS — E669 Obesity, unspecified: Secondary | ICD-10-CM

## 2020-09-10 DIAGNOSIS — I5032 Chronic diastolic (congestive) heart failure: Secondary | ICD-10-CM

## 2020-09-10 DIAGNOSIS — E785 Hyperlipidemia, unspecified: Secondary | ICD-10-CM | POA: Diagnosis not present

## 2020-09-10 NOTE — Patient Instructions (Signed)
Visit Information  PATIENT GOALS:  Goals Addressed             This Visit's Progress    Protect My Health;Manage Depression issues faced       Timeframe:  Short-Term Goal Priority:  Medium Progress : On Track Start Date:              09/10/20               Expected End Date:            12/11/20           Follow Up Date 10/26/20   Protect My Health (Patient) Manage Depression issues    Why is this important?   Screening tests can find diseases early when they are easier to treat.  Your doctor or nurse will talk with you about which tests are important for you.  Getting shots for common diseases like the flu and shingles will help prevent them.     Patient Self Care Activities:  Self administers medications as prescribed Attends all scheduled provider appointments Performs ADL's independently  Patient Coping Strengths:  Family Friends  Patient Self Care Deficits:  Depression issues  Patient Goals:  - spend time or talk with others at least 2 to 3 times per week - practice relaxation or meditation daily - keep a calendar with appointment dates  Follow Up Plan: LCSW to call client on 10/26/20     [name].Teairra Millar MSW, LCSW Licensed Clinical Social Worker [facility] [place] Care Management [phone]

## 2020-09-10 NOTE — Telephone Encounter (Signed)
Savannah Burns (Key: [id number])  [place]

## 2020-09-10 NOTE — Chronic Care Management (AMB) (Signed)
Chronic Care Management    Clinical Social Work Note  09/10/2020 Name: Savannah Burns MRN: [id number] DOB: 09-28-1950  Savannah Burns is a 70 y.o. year old female who is a primary care patient of [name], DO. The CCM team was consulted to assist the patient with chronic disease management and/or care coordination needs related to: [employer] .   Engaged with patient by telephone for follow up visit in response to provider referral for social work chronic care management and care coordination services.   Consent to Services:  The patient was given information about Chronic Care Management services, agreed to services, and gave verbal consent prior to initiation of services.  Please see initial visit note for detailed documentation.   Patient agreed to services and consent obtained.   Assessment: Review of patient past medical history, allergies, medications, and health status, including review of relevant consultants reports was performed today as part of a comprehensive evaluation and provision of chronic care management and care coordination services.     SDOH (Social Determinants of Health) assessments and interventions performed:  SDOH Interventions    Flowsheet Row Most Recent Value  SDOH Interventions   Depression Interventions/Treatment  Medication        Advanced Directives Status: See Vyn[employer] application for related entries.  CCM Care Plan  Allergies  Allergen Reactions   Keflex [Cephalexin] Nausea And Vomiting    Outpatient Encounter Medications as of 09/10/2020  Medication Sig Note   Blood Glucose Calibration (GLUCOMETER DEX HIGH CONTROL) LIQD Test CBG's once daily.     Blood Glucose Monitoring Suppl (TRUE METRIX AIR GLUCOSE METER) w/Device KIT Check BS daily Dx E11.9    Cholecalciferol (VITAMIN D) 50 MCG (2000 UT) tablet Take 4,000 Units by mouth daily.    escitalopram (LEXAPRO) 10 MG tablet Take 1 tablet (10 mg total) by mouth daily.     ferrous sulfate 325 (65 FE) MG tablet Take 1 tablet (325 mg total) by mouth 2 (two) times daily with a meal. (Patient taking differently: Take 325 mg by mouth daily with breakfast.)    furosemide (LASIX) 20 MG tablet Take 20 mg by mouth. bid    glucose blood (TRUE METRIX BLOOD GLUCOSE TEST) test strip Check BS daily Dx E11.9    lisinopril (ZESTRIL) 10 MG tablet Take 1 tablet by mouth once daily (Patient taking differently: Take 10 mg by mouth daily.)    lovastatin (MEVACOR) 20 MG tablet Take 1 tablet (20 mg total) by mouth at bedtime.    metFORMIN (GLUCOPHAGE) 1000 MG tablet Take 1 tablet (1,000 mg total) by mouth daily with breakfast.    nadolol (CORGARD) 20 MG tablet Take 1 tablet (20 mg total) by mouth daily. 08/04/2020: No longer taking   nystatin powder Apply 1 application topically 3 (three) times daily. X7-10 days per rash flare    oxyCODONE-acetaminophen (PERCOCET) 10-325 MG tablet Take 1 tablet by mouth every 6 (six) hours as needed for pain.    OXYGEN Inhale 3 L into the lungs continuous.    pantoprazole (PROTONIX) 40 MG tablet Take 1 tablet (40 mg total) by mouth daily.    potassium chloride SA (KLOR-CON) 20 MEQ tablet Take 2 tablets (40 mEq total) by mouth 2 (two) times daily. (Patient taking differently: Take 40 mEq by mouth daily.)    torsemide (DEMADEX) 20 MG tablet Take 3 tablets (60 mg total) by mouth 2 (two) times daily. (Patient taking differently: Take 50 mg by mouth 2 (two) times daily.)  TRUEplus Lancets 30G MISC Check BS daily Dx E11.9    vitamin B-12 (CYANOCOBALAMIN) 1000 MCG tablet Take 2,000 mcg by mouth daily.    [DISCONTINUED] albuterol (PROVENTIL HFA;VENTOLIN HFA) 108 (90 Base) MCG/ACT inhaler Inhale 2 puffs into the lungs every 6 (six) hours as needed for wheezing or shortness of breath.    No facility-administered encounter medications on file as of 09/10/2020.    Patient Active Problem List   Diagnosis Date Noted   Hypoalbuminemia 05/17/2020   Moderate protein  malnutrition ([place]) 05/17/2020   Melena 05/16/2020   Hyperlipidemia    Hypokalemia    GI bleed 03/20/2020   Lower GI bleed 03/19/2020   Acute on chronic congestive heart failure (HCC)    Symptomatic anemia    Iron deficiency anemia    Heme positive stool    ABLA (acute blood loss anemia) 01/22/2020   Supplemental oxygen dependent 10/22/2019   Chronic dyspnea 10/22/2019   Hypertension associated with diabetes ([place]) 10/22/2019   Bilateral hand pain 09/21/2016   Fatigue 07/30/2015   Chronic diastolic HF (heart failure) ([place]) 06/29/2015   Postmenopausal bleeding 04/17/2015   Excessive daytime sleepiness 12/19/2014   Swelling of lower extremity 11/27/2014   Back pain 06/13/2013   Sinusitis, chronic 05/30/2012   Allergic rhinitis 06/06/2011   Hyperlipidemia associated with type 2 diabetes mellitus ([place]) 03/24/2009   Diabetes mellitus, type 2 ([place]) 08/12/2008   Pulmonary nodule 08/29/2007   COPD (chronic obstructive pulmonary disease) ([place]) 02/14/2007   Class 3 obesity 05/25/2006   TOBACCO DEPENDENCE 05/25/2006   Depression, recurrent ([place]) 05/25/2006   HYPERTENSION, BENIGN SYSTEMIC 05/25/2006   DJD, UNSPECIFIED 05/25/2006    Conditions to be addressed/monitored: Monitor client management of depression issues   Care Plan : LCSW Care Plan  Updates made by Katha Cabal, LCSW since 09/10/2020 12:00 AM     Problem: Emotional Distress      Goal: Emotional Health Supported; Manage Depression symptoms   Start Date: 09/10/2020  Expected End Date: 12/11/2020  This Visit's Progress: On track  Priority: Medium  Note:   Current barriers:   Patient in need of assistance with managing depression and depression symptoms  Mobility issues Some Pain issues  Clinical Goals:  patient will work with SW in next 30 days to address concerns related to depression and depression management for client Patient will call RNCM or LCSW as needed in next 30 days for CCM support Patient will  attend all scheduled client medical appointments in next 30 days  Clinical Interventions:  Collaboration with [name], DO regarding development and update of comprehensive plan of care as evidenced by provider attestation and co-signature Assessment of needs, barriers of client Chart reviewed to assess client needs Talked with client about her history of iron infusion treatments Talked with client about client needs Talked with client about energy level of client Talked with client about family supportive (granddaughter is supportive) Talked with client about upcoming medical appointments Talked with [name] about her appointment tomorrow at [facility] Talked with [name] about appetite of client (she said she has lost weight, perhaps 50 pounds weight loss in about 6-8 months) Talked with [name] about sleeping of client Talked with client about her applying for Medicaid (she said she has sent in [place] application a few months ago Talked with client about home repair needs. LCSW talked with client about her contacting Sharol Roussel at [facility] to talk with Juliann Pulse about home repair needs. Client has a living room floor  that needs repair)  Talked with client about ADLs completion Provided counseling support for client Talked with client about RNCM support for client through CCM program  Patient Coping Skills: Completes ADLs as able Takes medications as prescribed Attends scheduled medical appointments  Patient Deficits:  Mobility issues Depression issues  Patient Goals: In next 30 days, patient will: Attend scheduled medical appointments Call RNCM or LCSW as needed for CCM program support Talk with family members as needed to discuss ongoing client needs Take medications as prescribed -  Follow Up Plan: LCSW to call client on 10/26/20     [name].Angellee Cohill MSW, LCSW Licensed Clinical Social Worker [facility] Care Management [phone]

## 2020-09-11 ENCOUNTER — Other Ambulatory Visit: Payer: Self-pay

## 2020-09-11 ENCOUNTER — Inpatient Hospital Stay (HOSPITAL_COMMUNITY): Payer: Medicare PPO | Attending: Hematology

## 2020-09-11 ENCOUNTER — Telehealth: Payer: Self-pay | Admitting: Family Medicine

## 2020-09-11 ENCOUNTER — Other Ambulatory Visit: Payer: Self-pay | Admitting: Family Medicine

## 2020-09-11 DIAGNOSIS — L304 Erythema intertrigo: Secondary | ICD-10-CM

## 2020-09-11 DIAGNOSIS — D500 Iron deficiency anemia secondary to blood loss (chronic): Secondary | ICD-10-CM | POA: Diagnosis not present

## 2020-09-11 DIAGNOSIS — E538 Deficiency of other specified B group vitamins: Secondary | ICD-10-CM

## 2020-09-11 DIAGNOSIS — D472 Monoclonal gammopathy: Secondary | ICD-10-CM

## 2020-09-11 LAB — IRON AND TIBC
Iron: 31 ug/dL (ref 28–170)
Saturation Ratios: 7 % — ABNORMAL LOW (ref 10.4–31.8)
TIBC: 414 ug/dL (ref 250–450)
UIBC: 383 ug/dL

## 2020-09-11 LAB — LACTATE DEHYDROGENASE: LDH: 91 U/L — ABNORMAL LOW (ref 98–192)

## 2020-09-11 LAB — COMPREHENSIVE METABOLIC PANEL
ALT: 14 U/L (ref 0–44)
AST: 20 U/L (ref 15–41)
Albumin: 2.9 g/dL — ABNORMAL LOW (ref 3.5–5.0)
Alkaline Phosphatase: 91 U/L (ref 38–126)
Anion gap: 6 (ref 5–15)
BUN: 15 mg/dL (ref 8–23)
CO2: 27 mmol/L (ref 22–32)
Calcium: 8.5 mg/dL — ABNORMAL LOW (ref 8.9–10.3)
Chloride: 103 mmol/L (ref 98–111)
Creatinine, Ser: 0.59 mg/dL (ref 0.44–1.00)
GFR, Estimated: >60 mL/min (ref >60)
Glucose, Bld: 132 mg/dL — ABNORMAL HIGH (ref 70–99)
Potassium: 3.7 mmol/L (ref 3.5–5.1)
Sodium: 136 mmol/L (ref 135–145)
Total Bilirubin: 0.2 mg/dL — ABNORMAL LOW (ref 0.3–1.2)
Total Protein: 8 g/dL (ref 6.5–8.1)

## 2020-09-11 LAB — VITAMIN B12: Vitamin B-12: 243 pg/mL (ref 180–914)

## 2020-09-11 LAB — FERRITIN: Ferritin: 14 ng/mL (ref 11–307)

## 2020-09-11 MED ORDER — NYSTATIN 100000 UNIT/GM EX CREA: 1.0000 "application " | TOPICAL_CREAM | Freq: Two times a day (BID) | CUTANEOUS | 1 refills | Status: DC

## 2020-09-11 NOTE — Telephone Encounter (Signed)
Left message to call back  

## 2020-09-11 NOTE — Telephone Encounter (Signed)
Cream version sent instead.

## 2020-09-14 ENCOUNTER — Ambulatory Visit: Payer: Medicare PPO | Admitting: *Deleted

## 2020-09-14 DIAGNOSIS — E1159 Type 2 diabetes mellitus with other circulatory complications: Secondary | ICD-10-CM

## 2020-09-14 DIAGNOSIS — E1169 Type 2 diabetes mellitus with other specified complication: Secondary | ICD-10-CM

## 2020-09-14 DIAGNOSIS — D500 Iron deficiency anemia secondary to blood loss (chronic): Secondary | ICD-10-CM

## 2020-09-14 DIAGNOSIS — J449 Chronic obstructive pulmonary disease, unspecified: Secondary | ICD-10-CM | POA: Diagnosis not present

## 2020-09-14 DIAGNOSIS — I152 Hypertension secondary to endocrine disorders: Secondary | ICD-10-CM | POA: Diagnosis not present

## 2020-09-14 DIAGNOSIS — F339 Major depressive disorder, recurrent, unspecified: Secondary | ICD-10-CM | POA: Diagnosis not present

## 2020-09-14 DIAGNOSIS — E785 Hyperlipidemia, unspecified: Secondary | ICD-10-CM

## 2020-09-14 DIAGNOSIS — I5032 Chronic diastolic (congestive) heart failure: Secondary | ICD-10-CM

## 2020-09-14 DIAGNOSIS — D649 Anemia, unspecified: Secondary | ICD-10-CM

## 2020-09-14 LAB — KAPPA/LAMBDA LIGHT CHAINS
Kappa free light chain: 54.7 mg/L — ABNORMAL HIGH (ref 3.3–19.4)
Kappa, lambda light chain ratio: 3.55 — ABNORMAL HIGH (ref 0.26–1.65)
Lambda free light chains: 15.4 mg/L (ref 5.7–26.3)

## 2020-09-14 LAB — PROTEIN ELECTROPHORESIS, SERUM
A/G Ratio: 0.7 (ref 0.7–1.7)
Albumin ELP: 3 g/dL (ref 2.9–4.4)
Alpha-1-Globulin: 0.3 g/dL (ref 0.0–0.4)
Alpha-2-Globulin: 0.7 g/dL (ref 0.4–1.0)
Beta Globulin: 1 g/dL (ref 0.7–1.3)
Gamma Globulin: 2.7 g/dL — ABNORMAL HIGH (ref 0.4–1.8)
Globulin, Total: 4.6 g/dL — ABNORMAL HIGH (ref 2.2–3.9)
M-Spike, %: 2.4 g/dL — ABNORMAL HIGH
Total Protein ELP: 7.6 g/dL (ref 6.0–8.5)

## 2020-09-14 NOTE — Telephone Encounter (Signed)
Your request has been denied You asked for the drug listed above for your Erythema intertrigo ( rash and redness in folds of skin). [employer] follows Medicare rules. The Medicare rule in the Prescription Drug Manual (Chapter 6, Section 10.6) says an off-label use of a drug is a use that is not included on the drugs label as approved by the U.S. Food and Drug Administration (FDA). FDA approved drugs used for a disease other than what is on the official label may be covered under Medicare if [employer] determines the use to be medically accepted. [employer] makes these decisions on a case-by-case basis. We take into consideration the two major drug compendia. These compendia (or drug guides) are called the [place] and the [place] (AHFS-DI). Off-label use is medically accepted when there is evidence in one or more of the compendia that it works for the disease in question. [employer] has determined that the off-label use of this drug for your condition is not medically accepted per Medicare rules and isn&apos;t covered based on available information.

## 2020-09-15 ENCOUNTER — Encounter: Payer: Self-pay | Admitting: *Deleted

## 2020-09-15 ENCOUNTER — Other Ambulatory Visit (HOSPITAL_COMMUNITY): Payer: Self-pay | Admitting: Physician Assistant

## 2020-09-15 DIAGNOSIS — D500 Iron deficiency anemia secondary to blood loss (chronic): Secondary | ICD-10-CM

## 2020-09-15 NOTE — Patient Instructions (Signed)
Visit Information  PATIENT GOALS:  Goals Addressed             This Visit's Progress    Anemia Improved       Timeframe:  Short-Term Goal Priority:  High Start Date:                             Expected End Date:                       Follow-up: 10/15/20  Keep all medical appointments Talk with hematologist about Hgb level Seek appropriate medical attention for any new or worsening symptoms set a daily activity goal Use oxygen as needed but avoid if O2 sats are normal and she is not short of breath take a stool softener to prevent constipation don't eat dairy products, such as milk, cheese and ice cream with those high in iron eat 3 to 5 servings of fruits and vegetables each day eat dark, leafy greens, such as spinach, chard and collards include high-iron food, such as meat, chicken, fish, shellfish, dried fruit, beans and nuts in each meal pair iron-rich foods with foods high in vitamin C, such as oranges, strawberries and red peppers to help absorption Reach out to LCSW with any psychosocial concerns Call RN Care Manager as needed [phone]      Coping Skills Enhanced   On track    Timeframe:  Long-Range Goal Priority:  High Start Date:  07/31/20                           Expected End Date:  03/27/21                      Follow-up: 10/15/20  Keep appointments for all medical appointments and tests Talk with LCSW regarding anxiety, depression, psychosocial concerns, etc Reach out to hematologist/oncologist with any questions or concerns regarding test results or testing directions Get outside daily Talk with friends/family regularly Engage in an enjoyable activity everyday Increase exercise and physical activity level as tolerated Talk with RNCM about lunches and exercise programs at the [facility] Dept Call RN Care Manager as needed [phone]       Find Help in My Community   Not on track    Timeframe:  Short-Term Goal Priority:   Medium Start Date:  08/21/20                           Expected End Date: 09/14/20                      Follow-up: 10/15/20  Talk with Care Guides regarding what resources they can assist her with Call RN Care manager as needed [phone]      Monitor and Manage My Blood Sugar-Diabetes Type 2   On track    Timeframe:  Long-Range Goal Priority:  Medium Start Date:  09/15/20                           Expected End Date:                       Follow Up Date 10/15/20    Take medication as prescribed Check and record blood sugar several times a week Call PCP with  any readings outside of recommended range Keep appt with PCP on 12/09/20 Follow an ADA/Carb Modified diet     Why is this important?   Checking your blood sugar at home helps to keep it from getting very high or very low.  Writing the results in a diary or log helps the doctor know how to care for you.  Your blood sugar log should have the time, date and the results.  Also, write down the amount of insulin or other medicine that you take.  Other information, like what you ate, exercise done and how you were feeling, will also be helpful.     Notes:          Patient verbalizes understanding of instructions provided today and agrees to view in [place].   Follow Up Plan:  Telephone follow up appointment with care management team member scheduled for: 10/15/20 with RNCM  The patient has been provided with contact information for the care management team and has been advised to call with any health related questions or concerns.   [name], BSN, RN-BC Embedded Chronic Care Manager [facility] [place] [facility] Direct Dial: [phone]

## 2020-09-15 NOTE — Chronic Care Management (AMB) (Signed)
Chronic Care Management   CCM RN Visit Note  09/14/2020 Name: Savannah Burns MRN: [id number] DOB: March 01, 1951  Subjective: Savannah Burns is a 70 y.o. year old female who is a primary care patient of [name], DO. The care management team was consulted for assistance with disease management and care coordination needs.    Engaged with patient by telephone for follow up visit in response to provider referral for case management and/or care coordination services.   Consent to Services:  The patient was given information about Chronic Care Management services, agreed to services, and gave verbal consent prior to initiation of services.  Please see initial visit note for detailed documentation.   Patient agreed to services and verbal consent obtained.   Assessment: Review of patient past medical history, allergies, medications, health status, including review of consultants reports, laboratory and other test data, was performed as part of comprehensive evaluation and provision of chronic care management services.   SDOH (Social Determinants of Health) assessments and interventions performed:    CCM Care Plan  Allergies  Allergen Reactions   Keflex [Cephalexin] Nausea And Vomiting    Outpatient Encounter Medications as of 09/14/2020  Medication Sig Note   Blood Glucose Calibration (GLUCOMETER DEX HIGH CONTROL) LIQD Test CBG's once daily.     Blood Glucose Monitoring Suppl (TRUE METRIX AIR GLUCOSE METER) w/Device KIT Check BS daily Dx E11.9    Cholecalciferol (VITAMIN D) 50 MCG (2000 UT) tablet Take 4,000 Units by mouth daily.    escitalopram (LEXAPRO) 10 MG tablet Take 1 tablet (10 mg total) by mouth daily.    ferrous sulfate 325 (65 FE) MG tablet Take 1 tablet (325 mg total) by mouth 2 (two) times daily with a meal. (Patient taking differently: Take 325 mg by mouth daily with breakfast.)    furosemide (LASIX) 20 MG tablet Take 20 mg by mouth. bid    glucose blood (TRUE  METRIX BLOOD GLUCOSE TEST) test strip Check BS daily Dx E11.9    lisinopril (ZESTRIL) 10 MG tablet Take 1 tablet by mouth once daily (Patient taking differently: Take 10 mg by mouth daily.)    lovastatin (MEVACOR) 20 MG tablet Take 1 tablet (20 mg total) by mouth at bedtime.    metFORMIN (GLUCOPHAGE) 1000 MG tablet Take 1 tablet (1,000 mg total) by mouth daily with breakfast.    nadolol (CORGARD) 20 MG tablet Take 1 tablet (20 mg total) by mouth daily. 08/04/2020: No longer taking   nystatin cream (MYCOSTATIN) Apply 1 application topically 2 (two) times daily. X14 days or until rash resolved    oxyCODONE-acetaminophen (PERCOCET) 10-325 MG tablet Take 1 tablet by mouth every 6 (six) hours as needed for pain.    OXYGEN Inhale 3 L into the lungs continuous.    pantoprazole (PROTONIX) 40 MG tablet Take 1 tablet (40 mg total) by mouth daily.    potassium chloride SA (KLOR-CON) 20 MEQ tablet Take 2 tablets (40 mEq total) by mouth 2 (two) times daily. (Patient taking differently: Take 40 mEq by mouth daily.)    torsemide (DEMADEX) 20 MG tablet Take 3 tablets (60 mg total) by mouth 2 (two) times daily. (Patient taking differently: Take 50 mg by mouth 2 (two) times daily.)    TRUEplus Lancets 30G MISC Check BS daily Dx E11.9    vitamin B-12 (CYANOCOBALAMIN) 1000 MCG tablet Take 2,000 mcg by mouth daily.    [DISCONTINUED] albuterol (PROVENTIL HFA;VENTOLIN HFA) 108 (90 Base) MCG/ACT inhaler Inhale 2 puffs into  the lungs every 6 (six) hours as needed for wheezing or shortness of breath.    No facility-administered encounter medications on file as of 09/14/2020.    Patient Active Problem List   Diagnosis Date Noted   Hypoalbuminemia 05/17/2020   Moderate protein malnutrition ([place]) 05/17/2020   Melena 05/16/2020   Hyperlipidemia    Hypokalemia    GI bleed 03/20/2020   Lower GI bleed 03/19/2020   Acute on chronic congestive heart failure (HCC)    Symptomatic anemia    Iron deficiency anemia    Heme  positive stool    ABLA (acute blood loss anemia) 01/22/2020   Supplemental oxygen dependent 10/22/2019   Chronic dyspnea 10/22/2019   Hypertension associated with diabetes ([place]) 10/22/2019   Bilateral hand pain 09/21/2016   Fatigue 07/30/2015   Chronic diastolic HF (heart failure) ([place]) 06/29/2015   Postmenopausal bleeding 04/17/2015   Excessive daytime sleepiness 12/19/2014   Swelling of lower extremity 11/27/2014   Back pain 06/13/2013   Sinusitis, chronic 05/30/2012   Allergic rhinitis 06/06/2011   Hyperlipidemia associated with type 2 diabetes mellitus ([place]) 03/24/2009   Diabetes mellitus, type 2 ([place]) 08/12/2008   Pulmonary nodule 08/29/2007   COPD (chronic obstructive pulmonary disease) ([place]) 02/14/2007   Class 3 obesity 05/25/2006   TOBACCO DEPENDENCE 05/25/2006   Depression, recurrent ([place]) 05/25/2006   HYPERTENSION, BENIGN SYSTEMIC 05/25/2006   DJD, UNSPECIFIED 05/25/2006    Conditions to be addressed/monitored:DMII, Depression, and anemia  Care Plan : RNCM: Iron Deficiency (Adult)  Updates made by Ilean China, RN since 09/15/2020 12:00 AM     Problem: Iron Deficiency   Priority: High  Note:   Iron deficiency anemia in a patient with depression, Obesity, DM, HTN, COPD, DJD, HLD, CHF     Long-Range Goal: Anemia Management   Start Date: 02/28/2020  This Visit's Progress: On track  Recent Progress: On track  Priority: High  Note:   Objective: Lab Results  Component Value Date   WBC 6.4 08/20/2020   HGB 9.3 (L) 08/20/2020   HCT 32.0 (L) 08/20/2020   MCV 98.8 08/20/2020   PLT 138 (L) 08/20/2020   Lab Results  Component Value Date   IRON 31 09/11/2020   TIBC 414 09/11/2020   FERRITIN 14 09/11/2020  Current Barriers:  Chronic Disease Management support and education needs related to anemia in a patient with Depression, Obesity, DM, HTN, COPD, DJD, HLD, CHF, hepatic cirrhosis  Cause of anemia unkown Financial constraints Stress of chronic  disease  Nurse Case Manager Clinical Goal(s):  Patient will work with hematologist regarding medical management anemia Patient will demonstrate ongoing health maintenance behavior by keeping all medical appointments Patient will talk with [facility] regarding self-management of anemia  Interventions:  Inter-disciplinary care team collaboration (see longitudinal plan of care) Chart reviewed including recent office notes, surgical reports, pathology reports, and lab results Reviewed and discussed recent lab results with patient per her request Iron, TIBC, and Ferritin have improved and are WNL now Hgb was not done with those labs on 09/11/20 Reviewed upcoming appointment and advised patient to discuss Hgb with hematologist at visit on 09/18/20 Reviewed and discussed medications and importance of compliance Therapeutic listening utilized Patient is feeling better overall and has more energy. She is not as short of breath and can go without oxygen while sitting and doing some ADLs Discussed desire for portable oxygen concentrator but that she did not qualify during walking test at last PCP office visit Encouraged to reach out to [facility]  as needed  Patient Self Care Activities:  Self administers medications as prescribed Call provider office for new concerns or questions Able to perform ADLs and IADLs independently  Patient Goals: Over the next 90 days, patient will: Keep all medical appointments Talk with hematologist about Hgb level Seek appropriate medical attention for any new or worsening symptoms set a daily activity goal Use oxygen as needed but avoid if O2 sats are normal and she is not short of breath take a stool softener to prevent constipation don't eat dairy products, such as milk, cheese and ice cream with those high in iron eat 3 to 5 servings of fruits and vegetables each day eat dark, leafy greens, such as spinach, chard and collards include high-iron food, such as meat, chicken,  fish, shellfish, dried fruit, beans and nuts in each meal pair iron-rich foods with foods high in vitamin C, such as oranges, strawberries and red peppers to help absorption Reach out to LCSW with any psychosocial concerns Call RN Care Manager as needed [phone]  Follow Up Plan:  Telephone follow up appointment with care management team member scheduled for: 10/15/20  with RNCM The patient has been provided with contact information for the care management team and has been advised to call with any health related questions or concerns.  Next PCP appointment scheduled for: 12/09/20 with Dr Lajuana Ripple    Care Plan : RNCM: General Plan of Care (Adult)  Updates made by Ilean China, RN since 09/15/2020 12:00 AM     Problem: Coping Skills (General Plan of Care)      Long-Range Goal: Coping Skills Enhanced   Start Date: 07/31/2020  This Visit's Progress: On track  Recent Progress: On track  Priority: High  Note:   Current Barriers:  Knowledge Deficits related to cause of anemia and and additional tests needed to identify a cause Care Coordination needs related to abnormal lab results in a patient with Depression, Obesity, DM, HTN, COPD, DJD, HLD, CHF, iron deficiency anemia, hepatic cirrhosis   Nurse Case Manager Clinical Goal(s):  patient will work with hematologist/oncologist to address needs related to anemia and other abnormal lab values patient will meet with RN Care Manager to address coping skills regarding chronic conditions and new diagnoses  Interventions:  1:1 collaboration with Claretta Fraise, MD regarding development and update of comprehensive plan of care as evidenced by provider attestation and co-signature Inter-disciplinary care team collaboration (see longitudinal plan of care) Evaluation of current treatment plan related to anemia and abnormal lab results  and patient's adherence to plan as established by provider. Chart reviewed including recent office notes, lab  results, imaging reports Assessed coping strategies Discussed family/social support Granddaughter lives with her but has started working a few days a week. This caused a lot of anxiety and fear because she was going to be home alone.  Therapeutic listening utilized Patient is less anxious now and feels like she is doing well since starting lexapro on 09/04/20. She denies any side effects.  Encourage to continue talking with LCSW and to reach out as needed Encouraged participation in pleasurable group activities such as hobbies, singing, sports or volunteering  Patient is interested in having meals with other seniors as a way of socializing Recommended [place] Reached out to Rec Dept to verify that they still offer lunch Mon-Thurs Reached out to [employer] to find out the process for applying and qualifying for this service at the recreation department. Awaiting response.  Promoted a regular  daily exercise program based on tolerance, ability and patient choice to support positive thinking about disease and potential diagnosis Exercise programs available at [facility]. Covered by [employer]. Encouraged to reach out to LCSW or RNCM as needed  Self Care Activities:  Self administers medications as prescribed Calls pharmacy for medication refills Performs ADL's independently Calls provider office for new concerns or questions  Patient Goals Over the next 90 days, patient will: Keep appointments for all medical appointments and tests Talk with LCSW regarding anxiety, depression, psychosocial concerns, etc Reach out to hematologist/oncologist with any questions or concerns regarding test results or testing directions Get outside daily Talk with friends/family regularly Engage in an enjoyable activity everyday Increase exercise and physical activity level as tolerated Talk with RNCM about lunches and exercise programs at the  [facility] Dept Call RN Care Manager as needed [phone]  Follow Up Plan:  Telephone follow up appointment with care management team member scheduled for: 10/15/20 with RNCM The patient has been provided with contact information for the care management team and has been advised to call with any health related questions or concerns.  Next PCP appointment scheduled for: 12/09/20 with Dr Lajuana Ripple     Problem: Quality of Life (General Plan of Care)      Goal: Find Help in the Community   Start Date: 08/21/2020  This Visit's Progress: Not on track  Recent Progress: Not on track  Priority: Medium  Note:   Current Barriers:  Knowledge Deficits related to food and home improvement resources Financial Constraints.  Unable to prepare meals easily  Nurse Case Manager Clinical Goal(s):  patient will work with [place] [facility] to address needs related to need for food and home improvement resources  Interventions:  1:1 collaboration with [name], DO regarding development and update of comprehensive plan of care as evidenced by provider attestation and co-signature Inter-disciplinary care team collaboration (see longitudinal plan of care) Chart reviewed including relevant office notes, correspondence notes, lab results, and imaging reports Referral to Care Guides made previously but their outreach to patient was unsuccessful.  Encouraged patient to f/u with Care Guides about community resources Discussed plans with patient for ongoing care management follow up and provided patient with direct contact information for care management team  Self Care Activities:  Self administers medications as prescribed Attends all scheduled provider appointments Calls provider office for new concerns or questions  Patient Goals Over the next 90 days, patient will: Talk with Care Guides regarding what resources they can assist her with Call RN Care manager as needed  [phone]  Follow Up Plan:  Telephone follow up appointment with care management team member scheduled for: 10/15/20 RNCM The patient has been provided with contact information for the care management team and has been advised to call with any health related questions or concerns.  Next PCP appointment scheduled for: 12/09/20 with Dr Lajuana Ripple     Care Plan : RNCM: Diabetes Type 2 (Adult)  Updates made by Ilean China, RN since 09/15/2020 12:00 AM     Problem: Glycemic Management (Diabetes, Type 2)   Priority: Medium     Long-Range Goal: Glycemic Management Optimized   Start Date: 09/15/2020  This Visit's Progress: On track  Priority: Medium  Note:   Objective: Lab Results  Component Value Date   HGBA1C 5.3 09/04/2020   HGBA1C 6.3 (H) 01/22/2020   HGBA1C 6.6 (H) 10/29/2019   Lab Results  Component Value Date  MICROALBUR 1.1 06/27/2014   LDLCALC 76 10/29/2019   CREATININE 0.59 09/11/2020  Current Barriers:  Chronic Disease Management support and education needs related to diabetes  Nurse Case Manager Clinical Goal(s):  patient will work with PCP to address needs related to medical management of diabetes patient will meet with RN Care Manager to address self-management of diabetes  Interventions:  1:1 collaboration with [name], DO regarding development and update of comprehensive plan of care as evidenced by provider attestation and co-signature Inter-disciplinary care team collaboration (see longitudinal plan of care) Chart reviewed including relevant office notes, correspondence notes, lab results, and imaging reports Evaluation of current treatment plan related to diabetes and patient's adherence to plan as established by provider. Reviewed medications with patient and discussed importance of medication adherence Discussed recent office visit with PCP on 09/04/20   Reviewed and discussed decrease in A1C to 5.3 and subsequent decrease in Metformin from  1028m BID to QD Patient is compliant with this dose Discussed possible reasons for decrease in A1C Only change has been a 50 lb weight loss Encouraged to continue following an ADA/Carb Modified diet Reviewed upcoming appointments: PCP 12/09/20 Encouraged home blood sugar testing at least a few times a week. Patient is not testing at home now.  Discussed plans with patient for ongoing care management follow up and provided patient with direct contact information for care management team  Self Care Activities:  Self administers medications as prescribed Attends all scheduled provider appointments Performs ADL's independently Calls provider office for new concerns or questions  Patient Goals Over the next 90 days, patient will: Take medication as prescribed Check and record blood sugar several times a week Call PCP with any readings outside of recommended range Keep appt with PCP on 12/09/20 Follow an ADA/Carb Modified diet  Follow Up Plan:  Telephone follow up appointment with care management team member scheduled for: 10/15/20 with RNCM  The patient has been provided with contact information for the care management team and has been advised to call with any health related questions or concerns.     KChong Sicilian BSN, RN-BC Embedded Chronic Care Manager [facility] Direct Dial: [phone]

## 2020-09-18 ENCOUNTER — Other Ambulatory Visit: Payer: Self-pay

## 2020-09-18 ENCOUNTER — Inpatient Hospital Stay (HOSPITAL_COMMUNITY): Payer: Medicare PPO

## 2020-09-18 ENCOUNTER — Inpatient Hospital Stay (HOSPITAL_BASED_OUTPATIENT_CLINIC_OR_DEPARTMENT_OTHER): Payer: Medicare PPO | Admitting: Physician Assistant

## 2020-09-18 VITALS — BP 133/55 | HR 77 | Temp 98.1°F | Resp 19 | Wt 289.2 lb

## 2020-09-18 DIAGNOSIS — Z85828 Personal history of other malignant neoplasm of skin: Secondary | ICD-10-CM | POA: Diagnosis not present

## 2020-09-18 DIAGNOSIS — D509 Iron deficiency anemia, unspecified: Secondary | ICD-10-CM | POA: Diagnosis not present

## 2020-09-18 DIAGNOSIS — Z79899 Other long term (current) drug therapy: Secondary | ICD-10-CM | POA: Diagnosis not present

## 2020-09-18 DIAGNOSIS — D500 Iron deficiency anemia secondary to blood loss (chronic): Secondary | ICD-10-CM

## 2020-09-18 DIAGNOSIS — I110 Hypertensive heart disease with heart failure: Secondary | ICD-10-CM | POA: Diagnosis not present

## 2020-09-18 DIAGNOSIS — E538 Deficiency of other specified B group vitamins: Secondary | ICD-10-CM

## 2020-09-18 DIAGNOSIS — R0602 Shortness of breath: Secondary | ICD-10-CM | POA: Diagnosis not present

## 2020-09-18 DIAGNOSIS — C9000 Multiple myeloma not having achieved remission: Secondary | ICD-10-CM

## 2020-09-18 DIAGNOSIS — Z7984 Long term (current) use of oral hypoglycemic drugs: Secondary | ICD-10-CM | POA: Diagnosis not present

## 2020-09-18 DIAGNOSIS — I509 Heart failure, unspecified: Secondary | ICD-10-CM | POA: Diagnosis not present

## 2020-09-18 DIAGNOSIS — D472 Monoclonal gammopathy: Secondary | ICD-10-CM

## 2020-09-18 DIAGNOSIS — E785 Hyperlipidemia, unspecified: Secondary | ICD-10-CM | POA: Diagnosis not present

## 2020-09-18 DIAGNOSIS — E119 Type 2 diabetes mellitus without complications: Secondary | ICD-10-CM | POA: Diagnosis not present

## 2020-09-18 LAB — CBC WITH DIFFERENTIAL/PLATELET
Abs Immature Granulocytes: 0.01 10*3/uL (ref 0.00–0.07)
Basophils Absolute: 0 10*3/uL (ref 0.0–0.1)
Basophils Relative: 1 %
Eosinophils Absolute: 0 10*3/uL (ref 0.0–0.5)
Eosinophils Relative: 1 %
HCT: 30.9 % — ABNORMAL LOW (ref 36.0–46.0)
Hemoglobin: 9 g/dL — ABNORMAL LOW (ref 12.0–15.0)
Immature Granulocytes: 0 %
Lymphocytes Relative: 31 %
Lymphs Abs: 1.1 10*3/uL (ref 0.7–4.0)
MCH: 27.6 pg (ref 26.0–34.0)
MCHC: 29.1 g/dL — ABNORMAL LOW (ref 30.0–36.0)
MCV: 94.8 fL (ref 80.0–100.0)
Monocytes Absolute: 0.4 10*3/uL (ref 0.1–1.0)
Monocytes Relative: 12 %
Neutro Abs: 2 10*3/uL (ref 1.7–7.7)
Neutrophils Relative %: 55 %
Platelets: 88 10*3/uL — ABNORMAL LOW (ref 150–400)
RBC: 3.26 MIL/uL — ABNORMAL LOW (ref 3.87–5.11)
RDW: 17.1 % — ABNORMAL HIGH (ref 11.5–15.5)
WBC: 3.6 10*3/uL — ABNORMAL LOW (ref 4.0–10.5)
nRBC: 0 % (ref 0.0–0.2)

## 2020-09-18 MED ORDER — CYANOCOBALAMIN 1000 MCG/ML IJ SOLN
1000.0000 ug | Freq: Once | INTRAMUSCULAR | Status: AC
Start: 2020-09-18 — End: 2020-09-18
  Administered 2020-09-18: 1000 ug via INTRAMUSCULAR
  Filled 2020-09-18: qty 1

## 2020-09-18 NOTE — Progress Notes (Signed)
Mobile presents today for injection per the provider's orders.  Vitamin B12 administration without incident; injection site WNL; see MAR for injection details.  Patient tolerated procedure well and without incident.  No questions or complaints noted at this time. Patient discharged with family member stable by wheelchair.

## 2020-09-18 NOTE — Progress Notes (Signed)
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 female returns for routine follow-up of her iron deficiency anemia and smoldering myeloma.  She was last seen by Tarri Abernethy PA-C on 08/06/2020.  Iron deficiency anemia suspected to be secondary to occult GI bleeding, although specific source has not been identified.  She denies any current melena, rectal hemorrhage, or other sources of bleeding; no epistaxis, hematemesis, or hemoptysis.  She felt better after her last IV iron infusion, but over the past week she reports she has been getting more fatigued.   She does continue to have shortness of breath with exertion.  She denies chest pain or presyncopal episodes.  No palpitations.  She does have increased peripheral edema today, and reports that she has not taken her Lasix for the past 3 days.   She denies any B symptoms such as fever, chills, night sweats, unintentional weight loss.  No new bone pain or fractures.  No new onset paresthesias or neuropathy.  No new neurologic deficits.  She has 75% energy and 100% appetite. She endorses that she is maintaining a stable weight.    REVIEW OF SYSTEMS:  Review of Systems  Constitutional:  Positive for fatigue. Negative for appetite change, chills, diaphoresis, fever and unexpected weight change.  HENT:   Negative for lump/mass and nosebleeds.   Eyes:  Negative for eye problems.  Respiratory:  Positive for shortness of breath (With exertion). Negative for cough and hemoptysis.   Cardiovascular:  Positive for leg swelling. Negative for chest pain and palpitations.   Gastrointestinal:  Positive for nausea (After iron pill). Negative for abdominal pain, blood in stool, constipation, diarrhea and vomiting.  Genitourinary:  Negative for hematuria.   Skin: Negative.   Neurological:  Negative for dizziness, headaches and light-headedness.  Hematological:  Does not bruise/bleed easily.     PAST MEDICAL/SURGICAL HISTORY:  Past Medical History:  Diagnosis Date   Adrenal adenoma, left    Stable   Anxiety    Arthritis    bilateral hands   Depression    Diabetes mellitus, type 2 ([place]) 08/12/2008   Qualifier: Diagnosis of  By: Deborra Medina MD, Talia     Dyspnea    Esophageal varices (HCC)    Grade II diastolic dysfunction    History of kidney stones    Hyperlipidemia    Hypertension    Lower back pain    Lower GI bleed 03/19/2020   Panic attacks    Pneumonia    currently taking antibiotic and prednisone for early stages of pneumonia   Pulmonary nodules    bilateral   Skin cancer    face   Past Surgical History:  Procedure Laterality Date   BIOPSY  04/07/2020   Procedure: BIOPSY;  Surgeon: Eloise Harman, DO;  [facility];  Service: Endoscopy;;   Breast Cystectomy  Right    CESAREAN SECTION     COLONOSCOPY WITH PROPOFOL N/A 01/25/2020   Dr. Abbey Chatters: Nonbleeding internal hemorrhoids, diverticulosis, 5 mm polyp removed from the ascending colon, 10 mm polyp removed from the sigmoid colon, 30 mm polyp (tubulovillous adenoma with no high-grade dysplasia) removed from the  transverse colon via piecemeal status post tattoo.  Other polyps were tubular adenomas.  3 month surveillance colonoscopy recommended.   COLONOSCOPY WITH PROPOFOL N/A 04/07/2020   Procedure: COLONOSCOPY WITH PROPOFOL;  Surgeon: Eloise Harman, DO;  [facility];  Service: Endoscopy;  Laterality: N/A;  3:00pm, pt knows new time per office   CYSTOSCOPY/URETEROSCOPY/HOLMIUM LASER/STENT PLACEMENT Bilateral 03/01/2019   Procedure: CYSTOSCOPY/RETROGRADEURETEROSCOPY/HOLMIUM  LASER/STENT PLACEMENT;  Surgeon: Ceasar Mons, MD;  Location: [facility];  Service: Urology;  Laterality: Bilateral;  ONLY NEEDS 60 MIN   ESOPHAGOGASTRODUODENOSCOPY (EGD) WITH PROPOFOL N/A 01/25/2020   Dr. Abbey Chatters: 4 columns grade 1 esophageal varices   ESOPHAGOGASTRODUODENOSCOPY (EGD) WITH PROPOFOL N/A 05/18/2020   Procedure: ESOPHAGOGASTRODUODENOSCOPY (EGD) WITH PROPOFOL;  Surgeon: Eloise Harman, DO;  [facility];  Service: Endoscopy;  Laterality: N/A;   ESOPHAGOGASTRODUODENOSCOPY (EGD) WITH PROPOFOL N/A 07/28/2020   Procedure: ESOPHAGOGASTRODUODENOSCOPY (EGD) WITH PROPOFOL;  Surgeon: Eloise Harman, DO;  [facility];  Service: Endoscopy;  Laterality: N/A;  am or early PM due to givens capsule placement   GIVENS CAPSULE STUDY N/A 05/18/2020   Procedure: [place];  Surgeon: Harvel Quale, MD;  [facility];  Service: Gastroenterology;  Laterality: N/A;   GIVENS CAPSULE STUDY N/A 07/28/2020   Procedure: GIVENS CAPSULE STUDY;  Surgeon: Eloise Harman, DO;  [facility];  Service: Endoscopy;  Laterality: N/A;   POLYPECTOMY  01/25/2020   Procedure: POLYPECTOMY;  Surgeon: Eloise Harman, DO;  [facility];  Service: Endoscopy;;   POLYPECTOMY  04/07/2020   Procedure: POLYPECTOMY INTESTINAL;  Surgeon: Eloise Harman, DO;  [facility];  Service: Endoscopy;;   SKIN CANCER EXCISION     Face   SPINE SURGERY     SUBMUCOSAL TATTOO INJECTION  01/25/2020   Procedure: SUBMUCOSAL TATTOO INJECTION;  Surgeon: Eloise Harman, DO;  [facility];  Service: Endoscopy;;     SOCIAL HISTORY:  Social History   Socioeconomic History   Marital status: Widowed    Spouse name: Not on file   Number of children: 2   Years of education: 14   Highest education level: Not on file  Occupational History   Occupation: Retired   Tobacco Use   Smoking status: Former    Packs/day: 1.50    Years:  40.00    Pack years: 60.00    Types: Cigarettes    Quit date: 04/29/2015    Years since quitting: 5.3   Smokeless tobacco: Never   Tobacco comments:    Quit smoking 04/2015- Previous 1.5 ppd smoker  Vaping Use   Vaping Use: Never used  Substance and Sexual Activity   Alcohol use: No    Alcohol/week: 0.0 standard drinks   Drug use: No   Sexual activity: Not Currently    Birth control/protection: Post-menopausal  Other Topics Concern   Not on file  Social History Narrative   Her 30 year old granddaughter lives with her - one daughter lives nearby, but she doesn't have a good relationship with her. Has a great relationship with other daughter who lives 1.5 hrs away - talks to her daily on the phone.   Social Determinants of Health   Financial Resource Strain: Low Risk    Difficulty of Paying Living Expenses: Not very hard  Food Insecurity: No Food Insecurity   Worried About Running Out of Food in the Last Year: Never true   [employer]  in the Last Year: Never true  Transportation Needs: No Transportation Needs   Lack of Transportation (Medical): No   Lack of Transportation (Non-Medical): No  Physical Activity: Inactive   Days of Exercise per Week: 0 days   Minutes of Exercise per Session: 0 min  Stress: No Stress Concern Present   Feeling of Stress : Not at all  Social Connections: Socially Isolated   Frequency of Communication with Friends and Family: More than three times a week   Frequency of Social Gatherings with Friends and Family: Once a week   Attends Religious Services: Never   Marine scientist or Organizations: No   Attends Archivist Meetings: Never   Marital Status: Widowed  Human resources officer Violence: Not At Risk   Fear of Current or Ex-Partner: No   Emotionally Abused: No   Physically Abused: No   Sexually Abused: No    FAMILY HISTORY:  Family History  Problem Relation Age of Onset   Diabetes Father    Heart disease Father 70       MI    Hypertension Father    Anemia Mother        Transfusion dependent   COPD Sister    Cancer Paternal Grandmother 85       Pancreatic    CURRENT MEDICATIONS:  Outpatient Encounter Medications as of 09/18/2020  Medication Sig Note   Blood Glucose Calibration (GLUCOMETER DEX HIGH CONTROL) LIQD Test CBG's once daily.     Blood Glucose Monitoring Suppl (TRUE METRIX AIR GLUCOSE METER) w/Device KIT Check BS daily Dx E11.9    Cholecalciferol (VITAMIN D) 50 MCG (2000 UT) tablet Take 4,000 Units by mouth daily.    escitalopram (LEXAPRO) 10 MG tablet Take 1 tablet (10 mg total) by mouth daily.    ferrous sulfate 325 (65 FE) MG tablet Take 1 tablet (325 mg total) by mouth 2 (two) times daily with a meal. (Patient taking differently: Take 325 mg by mouth daily with breakfast.)    furosemide (LASIX) 20 MG tablet Take 20 mg by mouth. bid    glucose blood (TRUE METRIX BLOOD GLUCOSE TEST) test strip Check BS daily Dx E11.9    lisinopril (ZESTRIL) 10 MG tablet Take 1 tablet by mouth once daily (Patient taking differently: Take 10 mg by mouth daily.)    lovastatin (MEVACOR) 20 MG tablet Take 1 tablet (20 mg total) by mouth at bedtime.    metFORMIN (GLUCOPHAGE) 1000 MG tablet Take 1 tablet (1,000 mg total) by mouth daily with breakfast.    nadolol (CORGARD) 20 MG tablet Take 1 tablet (20 mg total) by mouth daily. 08/04/2020: No longer taking   nystatin cream (MYCOSTATIN) Apply 1 application topically 2 (two) times daily. X14 days or until rash resolved    oxyCODONE-acetaminophen (PERCOCET) 10-325 MG tablet Take 1 tablet by mouth every 6 (six) hours as needed for pain.    OXYGEN Inhale 3 L into the lungs continuous.    pantoprazole (PROTONIX) 40 MG tablet Take 1 tablet (40 mg total) by mouth daily.    potassium chloride SA (KLOR-CON) 20 MEQ tablet Take 2 tablets (40 mEq total) by mouth 2 (two) times daily. (Patient taking differently: Take 40 mEq by mouth daily.)    torsemide (DEMADEX) 20 MG tablet Take 3  tablets (60 mg total) by mouth 2 (two) times daily. (Patient taking differently: Take 50 mg by mouth 2 (two) times daily.)    TRUEplus Lancets 30G MISC Check BS daily Dx E11.9  vitamin B-12 (CYANOCOBALAMIN) 1000 MCG tablet Take 2,000 mcg by mouth daily.    [DISCONTINUED] albuterol (PROVENTIL HFA;VENTOLIN HFA) 108 (90 Base) MCG/ACT inhaler Inhale 2 puffs into the lungs every 6 (six) hours as needed for wheezing or shortness of breath.    [DISCONTINUED] nystatin powder Apply 1 application topically 3 (three) times daily. X7-10 days per rash flare    No facility-administered encounter medications on file as of 09/18/2020.    ALLERGIES:  Allergies  Allergen Reactions   Keflex [Cephalexin] Nausea And Vomiting     PHYSICAL EXAM:  ECOG PERFORMANCE STATUS: 2 - Symptomatic, <50% confined to bed  There were no vitals filed for this visit. There were no vitals filed for this visit. Physical Exam Constitutional:      Appearance: Normal appearance. She is obese.  HENT:     Head: Normocephalic and atraumatic.     Mouth/Throat:     Mouth: Mucous membranes are moist.  Eyes:     Extraocular Movements: Extraocular movements intact.     Pupils: Pupils are equal, round, and reactive to light.  Cardiovascular:     Rate and Rhythm: Normal rate and regular rhythm.     Pulses: Normal pulses.     Heart sounds: Murmur heard.  Pulmonary:     Effort: Pulmonary effort is normal.     Breath sounds: Normal breath sounds.  Abdominal:     General: Bowel sounds are normal.     Palpations: Abdomen is soft.     Tenderness: There is no abdominal tenderness.  Musculoskeletal:        General: No swelling.     Right lower leg: Edema (3+ pretibial pitting edema) present.     Left lower leg: Edema (3+ pretibial pitting edema) present.  Lymphadenopathy:     Cervical: No cervical adenopathy.  Skin:    General: Skin is warm and dry.  Neurological:     General: No focal deficit present.     Mental Status: She  is alert and oriented to person, place, and time.  Psychiatric:        Mood and Affect: Mood normal.        Behavior: Behavior normal.     LABORATORY DATA:  I have reviewed the labs as listed.  CBC    Component Value Date/Time   WBC 6.4 08/20/2020 1031   RBC 3.24 (L) 08/20/2020 1031   HGB 9.3 (L) 08/20/2020 1031   HGB 7.8 (LL) 06/24/2020 1529   HCT 32.0 (L) 08/20/2020 1031   HCT 26.6 (L) 06/24/2020 1529   PLT 138 (L) 08/20/2020 1031   PLT 149 (L) 06/24/2020 1529   MCV 98.8 08/20/2020 1031   MCV 84 06/24/2020 1529   MCH 28.7 08/20/2020 1031   MCHC 29.1 (L) 08/20/2020 1031   RDW 19.3 (H) 08/20/2020 1031   RDW 18.4 (H) 06/24/2020 1529   LYMPHSABS 1.6 08/20/2020 1031   LYMPHSABS 1.8 06/24/2020 1529   MONOABS 0.7 08/20/2020 1031   EOSABS 0.1 08/20/2020 1031   EOSABS 0.1 06/24/2020 1529   BASOSABS 0.0 08/20/2020 1031   BASOSABS 0.0 06/24/2020 1529   CMP Latest Ref Rng & Units 09/11/2020 07/09/2020 05/19/2020  Glucose 70 - 99 mg/dL 132(H) 100(H) 123(H)  BUN 8 - 23 mg/dL _0 Creatinine 0.44 - 1.00 mg/dL 0.59 0.63 0.64  Sodium 135 - 145 mmol/L 136 137 134(L)  Potassium 3.5 - 5.1 mmol/L 3.7 3.6 3.6  Chloride 98 - 111 mmol/L 103 101 100  CO2  22 - 32 mmol/L _0 Calcium 8.9 - 10.3 mg/dL 8.5(L) 8.5(L) 8.1(L)  Total Protein 6.5 - 8.1 g/dL 8.0 8.6(H) 8.1  Total Bilirubin 0.3 - 1.2 mg/dL 0.2(L) 0.3 0.6  Alkaline Phos 38 - 126 U/L 91 91 82  AST 15 - 41 U/L _1 ALT 0 - 44 U/L _2 DIAGNOSTIC IMAGING:  I have independently reviewed the relevant imaging and discussed with the patient.  ASSESSMENT & PLAN: 1.  Iron deficiency anemia in the setting of GI blood loss -She has had three hospital admissions for GI bleeding in the past year (October 2021, December 2021, February 2022) -Hgb was as low as 3.7 (01/22/2020), and she has had 8 blood transfusions. -Before the GI bleeding started in October 2021, her hemoglobin was within normal range. -Extensive  endoscopic work-up, but source of chronic anemia has yet to be identified.   Last EGD (07/28/2020) showed grade 1 esophageal varices and gastritis, as well as prominent blood vessels at the base of the tongue and underneath the tongue (question lingual varicosities) Capsule endoscopy (07/28/2020) did not show any obvious lesions, erosions, or overt GI bleeding Last colonoscopy (January 2022) showed nonbleeding internal hemorrhoids, diverticulosis in the sigmoid and descending colon, and polypectomy. -She has been referred to ENT to investigate potential lingual varicosities as the source of bleed  -Patient was started on oral ferrous sulfate by her PCP, but with little improvement, suspect poor absorption of iron as she is also on pantoprazole daily - Received IV Venofer 1000 mg in 3 divided doses, last given 07/23/2020 - CBC (09/18/2020) with Hgb 9.0, MCV 94.8; iron panel (09/11/2020) with ferritin 14, iron saturation 7% - PLAN:  IV Venofer x1000 mg.  Repeat CBC Q4 weeks due to concern for ongoing blood loss.  RTC in 12 weeks with iron panel and CBC.  Continue follow-up with ENT and gastroenterology to find source of blood loss.   2.  Smoldering myeloma, IgG kappa - Initially diagnosed during work-up for anemia in April 2022. - Immunofixation shows monoclonal IgG kappa, initial M spike 2.6 (07/09/2020), initial light chain ratio 4.17 (07/09/2020) - Skeletal survey (07/16/2020):  No lytic lesions - PET scan (08/04/2020): negative for any focal hypermetabolic activity in the skeleton, but did show mild diffuse marrow activity which is likely anemia marrow response after being given IV iron; no soft tissue plasmacytoma; no lytic lesions on CT portion of exam - Bone marrow biopsy (08/04/2020): Plasma cells 18%; hypercellular bone marrow with trilineage hematopoiesis, no significant dyspoiesis or increase in blastic cells - FISH panel for plasma cell disorders showed no evidence of abnormalities - Cytogenics unable  to be completed, as cultures from submitted sample failed to yield any metaphase cells -No CRAB features: Since bone marrow biopsy showed adequate erythropoiesis, patient's persistent anemia is due to blood loss as addressed above.  Calcium and creatinine within normal limits. - Per Mayo 20/20/2 Score, patient is intermediate risk (1/3 criteria met); IMWG Score = 5 (low-intermediate risk) - NO TREATMENT INDICATED AT THIS TIME - Most recent labs (09/11/2020): Stable M spike 2.4, stable light chain ratio 3.55, calcium 8.5, creatinine 0.59, Hgb 9.0 - PLAN: Myeloma panel is stable, will repeat in 3 months.  RTC in 3 months for follow-up and repeat labs.  3.  Mild thrombocytopenia and leukopenia, secondary to cirrhosis - Platelets 88 (09/18/2020) - B12 deficiency as below, folate and copper normal - Suspect thrombocytopenia and leukopenia secondary to underlying cirrhosis -  Denies any signs of gross hemorrhage at this time.  No recurrent infections. - PLAN: Treat B12 deficiency as below.  Continue to monitor CBCs.   4.  Borderline vitamin B12 deficiency - B12 remains low (243) despite oral supplementation; last methylmalonic acid (07/09/2020) elevated at 439 - PLAN: Start monthly B12 injections (first dose today).  Continue oral B12 supplementation at home.  Recheck B12/methylmalonic acid in 3 months.   5.  Congestive heart failure - Mild fluid overload noted - patient reports that she did not take her Lasix for the past 3 days - PLAN: Educated patient on compliance with diuretics, informed of alarm symptoms that would warrant presentation to ED.   PLAN SUMMARY & DISPOSITION: - IV Venofer x3 - CBC every 4 weeks - Full lab panel in 12 weeks (myeloma panel, iron panel, B12/methylmalonic acid) - Monthly B12 injections - RTC in 3 months (a week after labs)  All questions were answered. The patient knows to call the clinic with any problems, questions or concerns.  Medical decision making:  Moderate  Time spent on visit: I spent 25 minutes counseling the patient face to face. The total time spent in the appointment was 40 minutes and more than 50% was on counseling.   Harriett Rush, PA-C  09/18/20 4:39 PM

## 2020-09-18 NOTE — Patient Instructions (Signed)
[place] at [facility] Discharge Instructions  You were seen today by Tarri Abernethy PA-C for your anemia and smoldering myeloma.  ANEMIA: Your hemoglobin is fairly stable at 9.0, but your iron stores are low.  We will bring you in for IV iron x3 treatments.  We will continue to check your CBC once a month due to concern for possible ongoing blood loss.  MODERATE MYELOMA: Your smoldering myeloma panel is essentially stable.  We will continue to monitor this closely.  We will recheck your labs in 3 months.  B12 DEFICIENCY: We will start giving you monthly B12 injections on the same day as your lab checks.  LABS: Check CBC every 4 weeks.  In 3 months, we will check a full lab panel  FOLLOW-UP APPOINTMENT: 3 months   Thank you for choosing [place] at [facility] to provide your oncology and hematology care.  To afford each patient quality time with our provider, please arrive at least 15 minutes before your scheduled appointment time.   If you have a lab appointment with the [facility] please come in thru the Main Entrance and check in at the main information desk.  You need to re-schedule your appointment should you arrive 10 or more minutes late.  We strive to give you quality time with our providers, and arriving late affects you and other patients whose appointments are after yours.  Also, if you no show three or more times for appointments you may be dismissed from the clinic at the providers discretion.     Again, thank you for choosing [facility].  Our hope is that these requests will decrease the amount of time that you wait before being seen by our physicians.       _____________________________________________________________  Should you have questions after your visit to [facility], please contact our office at [phone] and follow the prompts.  Our office hours are 8:00 a.m. and 4:30 p.m.  Monday - Friday.  Please note that voicemails left after 4:00 p.m. may not be returned until the following business day.  We are closed weekends and major holidays.  You do have access to a nurse 24-7, just call the main number to the clinic [phone] and do not press any options, hold on the line and a nurse will answer the phone.    For prescription refill requests, have your pharmacy contact our office and allow 72 hours.    Due to Covid, you will need to wear a mask upon entering the hospital. If you do not have a mask, a mask will be given to you at the Main Entrance upon arrival. For doctor visits, patients may have 1 support person age 49 or older with them. For treatment visits, patients can not have anyone with them due to social distancing guidelines and our immunocompromised population.

## 2020-09-23 DIAGNOSIS — J449 Chronic obstructive pulmonary disease, unspecified: Secondary | ICD-10-CM | POA: Diagnosis not present

## 2020-09-25 ENCOUNTER — Ambulatory Visit (HOSPITAL_COMMUNITY): Payer: Medicare PPO

## 2020-09-29 ENCOUNTER — Ambulatory Visit (INDEPENDENT_AMBULATORY_CARE_PROVIDER_SITE_OTHER): Payer: Medicare PPO | Admitting: Nurse Practitioner

## 2020-09-29 ENCOUNTER — Telehealth: Payer: Self-pay | Admitting: Nurse Practitioner

## 2020-09-29 DIAGNOSIS — H00015 Hordeolum externum left lower eyelid: Secondary | ICD-10-CM

## 2020-09-29 MED ORDER — ERYTHROMYCIN 5 MG/GM OP OINT: 1.0000 "application " | TOPICAL_OINTMENT | Freq: Every day | OPHTHALMIC | 1 refills | Status: DC

## 2020-09-29 NOTE — Progress Notes (Signed)
   Virtual Visit  Note Due to COVID-19 pandemic this visit was conducted virtually. This visit type was conducted due to national recommendations for restrictions regarding the COVID-19 Pandemic (e.g. social distancing, sheltering in place) in an effort to limit this patient's exposure and mitigate transmission in our community. All issues noted in this document were discussed and addressed.  A physical exam was not performed with this format.  I connected with [name] on 09/29/20 at 1:56 by telephone and verified that I am speaking with the correct person using two identifiers. Savannah Burns is currently located at home and no one is currently with  her during visit. The provider, Mary-Margaret Hassell Done, FNP is located in their office at time of visit.  I discussed the limitations, risks, security and privacy concerns of performing an evaluation and management service by telephone and the availability of in person appointments. I also discussed with the patient that there may be a patient responsible charge related to this service. The patient expressed understanding and agreed to proceed.   History and Present Illness:   Chief Complaint: Stye   HPI Patient states she has had styes in both eyes 4 times in the last several weeks. Currently developed n left lower lid. She has been using hot compressses. She has not gotten any meds for this.     Review of Systems  Constitutional: Negative.   HENT: Negative.    Eyes:  Positive for pain and discharge (thick  discharge). Negative for blurred vision, double vision and photophobia.  Skin: Negative.   Neurological: Negative.   Psychiatric/Behavioral: Negative.      Observations/Objective: Alert and oriented- answers all questions appropriately No distress Stye   Assessment and Plan: [name] in today with chief complaint of Stye   1. Hordeolum externum of left lower eyelid Clean eyes with baby shampoo  BID Avoid rubbing eye Meds ordered this encounter  Medications   erythromycin ophthalmic ointment    Sig: Place 1 application into the left eye at bedtime.    Dispense:  3.5 g    Refill:  1    Order Specific Question:   Supervising Provider    Answer:   Caryl Pina A [[id number]]        Follow Up Instructions: Prn     I discussed the assessment and treatment plan with the patient. The patient was provided an opportunity to ask questions and all were answered. The patient agreed with the plan and demonstrated an understanding of the instructions.   The patient was advised to call back or seek an in-person evaluation if the symptoms worsen or if the condition fails to improve as anticipated.  The above assessment and management plan was discussed with the patient. The patient verbalized understanding of and has agreed to the management plan. Patient is aware to call the clinic if symptoms persist or worsen. Patient is aware when to return to the clinic for a follow-up visit. Patient educated on when it is appropriate to go to the emergency department.   Time call ended:  2:07  I provided 12 minutes of  non face-to-face time during this encounter.    Mary-Margaret Hassell Done, FNP

## 2020-10-01 DIAGNOSIS — E669 Obesity, unspecified: Secondary | ICD-10-CM | POA: Diagnosis not present

## 2020-10-01 DIAGNOSIS — F1120 Opioid dependence, uncomplicated: Secondary | ICD-10-CM | POA: Diagnosis not present

## 2020-10-01 DIAGNOSIS — G8929 Other chronic pain: Secondary | ICD-10-CM | POA: Diagnosis not present

## 2020-10-01 DIAGNOSIS — Z79899 Other long term (current) drug therapy: Secondary | ICD-10-CM | POA: Diagnosis not present

## 2020-10-01 DIAGNOSIS — M545 Low back pain, unspecified: Secondary | ICD-10-CM | POA: Diagnosis not present

## 2020-10-02 ENCOUNTER — Inpatient Hospital Stay (HOSPITAL_COMMUNITY): Payer: Medicare PPO | Attending: Hematology

## 2020-10-02 ENCOUNTER — Other Ambulatory Visit: Payer: Self-pay

## 2020-10-02 VITALS — BP 147/59 | HR 77 | Temp 97.2°F | Resp 20

## 2020-10-02 DIAGNOSIS — D509 Iron deficiency anemia, unspecified: Secondary | ICD-10-CM | POA: Diagnosis not present

## 2020-10-02 DIAGNOSIS — D500 Iron deficiency anemia secondary to blood loss (chronic): Secondary | ICD-10-CM

## 2020-10-02 DIAGNOSIS — Z79899 Other long term (current) drug therapy: Secondary | ICD-10-CM | POA: Diagnosis not present

## 2020-10-02 MED ORDER — SODIUM CHLORIDE 0.9 % IV SOLN
Freq: Once | INTRAVENOUS | Status: AC
Start: 2020-10-02 — End: 2020-10-02

## 2020-10-02 MED ORDER — SODIUM CHLORIDE 0.9 % IV SOLN
300.0000 mg | Freq: Once | INTRAVENOUS | Status: AC
  Administered 2020-10-02: 300 mg via INTRAVENOUS
  Filled 2020-10-02: qty 300

## 2020-10-02 NOTE — Progress Notes (Signed)
Iron infusion given today per MD orders. Tolerated infusion without adverse affects. Vital signs stable. No complaints at this time. Discharged from clinic ambulatory in stable condition. Alert and oriented x 3. F/U with [facility] as scheduled.

## 2020-10-02 NOTE — Progress Notes (Signed)
Chaplain engaged in an initial visit with [name], also known as "Ginny."  Ginny shared with chaplain about her healthcare journey, family, and past relationships. Donia Guiles took time to talk about the relationship she shares with her two daughters and granddaughter and what she desires from them.  She also discussed her previous relationships and what she learned and endured from that time in her life.  Ginny discussed the ways her life has changed due to her health and how it can sometimes be lonely.   Chaplain could assess through conversation that Donia Guiles holds some shame and guilt towards herself as she thought about her daughters and life.  Chaplain affirmed who Donia Guiles is now and the the hopes she has for the future.   Chaplain offered empathic listening, presence, and support.  Chaplain will continue to follow-up.

## 2020-10-02 NOTE — Patient Instructions (Signed)
[facility]  Discharge Instructions: Thank you for choosing [facility] to provide your oncology and hematology care.  If you have a lab appointment with the [facility], please come in thru the Main Entrance and check in at the main information desk.  Wear comfortable clothing and clothing appropriate for easy access to any Portacath or PICC line.   We strive to give you quality time with your provider. You may need to reschedule your appointment if you arrive late (15 or more minutes).  Arriving late affects you and other patients whose appointments are after yours.  Also, if you miss three or more appointments without notifying the office, you may be dismissed from the clinic at the provider's discretion.      For prescription refill requests, have your pharmacy contact our office and allow 72 hours for refills to be completed.    Today you received the following IV Venofer iron infusion.  BELOW ARE SYMPTOMS THAT SHOULD BE REPORTED IMMEDIATELY: *FEVER GREATER THAN 100.4 F (38 C) OR HIGHER *CHILLS OR SWEATING *NAUSEA AND VOMITING THAT IS NOT CONTROLLED WITH YOUR NAUSEA MEDICATION *UNUSUAL SHORTNESS OF BREATH *UNUSUAL BRUISING OR BLEEDING *URINARY PROBLEMS (pain or burning when urinating, or frequent urination) *BOWEL PROBLEMS (unusual diarrhea, constipation, pain near the anus) TENDERNESS IN MOUTH AND THROAT WITH OR WITHOUT PRESENCE OF ULCERS (sore throat, sores in mouth, or a toothache) UNUSUAL RASH, SWELLING OR PAIN  UNUSUAL VAGINAL DISCHARGE OR ITCHING   Items with * indicate a potential emergency and should be followed up as soon as possible or go to the Emergency Department if any problems should occur.  Please show the CHEMOTHERAPY ALERT CARD or IMMUNOTHERAPY ALERT CARD at check-in to the Emergency Department and triage nurse.  Should you have questions after your visit or need to cancel or reschedule your appointment, please contact [facility] [phone]  and follow the prompts.  Office hours are 8:00 a.m. to 4:30 p.m. Monday - Friday. Please note that voicemails left after 4:00 p.m. may not be returned until the following business day.  We are closed weekends and major holidays. You have access to a nurse at all times for urgent questions. Please call the main number to the clinic [phone] and follow the prompts.  For any non-urgent questions, you may also contact your provider using MyChart. We now offer e-Visits for anyone 19 and older to request care online for non-urgent symptoms. For details visit [email or web address].   Also download the MyChart app! Go to the app store, search "MyChart", open the app, select [place], and log in with your MyChart username and password.  Due to Covid, a mask is required upon entering the hospital/clinic. If you do not have a mask, one will be given to you upon arrival. For doctor visits, patients may have 1 support person aged 35 or older with them. For treatment visits, patients cannot have anyone with them due to current Covid guidelines and our immunocompromised population.

## 2020-10-09 ENCOUNTER — Encounter (HOSPITAL_COMMUNITY): Payer: Self-pay

## 2020-10-09 ENCOUNTER — Inpatient Hospital Stay (HOSPITAL_COMMUNITY): Payer: Medicare PPO

## 2020-10-09 ENCOUNTER — Other Ambulatory Visit: Payer: Self-pay

## 2020-10-09 VITALS — BP 137/65 | HR 78 | Temp 98.3°F | Resp 19

## 2020-10-09 DIAGNOSIS — D500 Iron deficiency anemia secondary to blood loss (chronic): Secondary | ICD-10-CM

## 2020-10-09 DIAGNOSIS — Z79899 Other long term (current) drug therapy: Secondary | ICD-10-CM | POA: Diagnosis not present

## 2020-10-09 DIAGNOSIS — D509 Iron deficiency anemia, unspecified: Secondary | ICD-10-CM | POA: Diagnosis not present

## 2020-10-09 MED ORDER — SODIUM CHLORIDE 0.9 % IV SOLN
Freq: Once | INTRAVENOUS | Status: AC
Start: 2020-10-09 — End: 2020-10-09

## 2020-10-09 MED ORDER — SODIUM CHLORIDE 0.9 % IV SOLN
300.0000 mg | Freq: Once | INTRAVENOUS | Status: AC
  Administered 2020-10-09: 300 mg via INTRAVENOUS
  Filled 2020-10-09: qty 15

## 2020-10-09 NOTE — Patient Instructions (Signed)
[facility]  Discharge Instructions: Thank you for choosing [facility] to provide your oncology and hematology care.  If you have a lab appointment with the [facility], please come in thru the Main Entrance and check in at the main information desk.  Wear comfortable clothing and clothing appropriate for easy access to any Portacath or PICC line.   We strive to give you quality time with your provider. You may need to reschedule your appointment if you arrive late (15 or more minutes).  Arriving late affects you and other patients whose appointments are after yours.  Also, if you miss three or more appointments without notifying the office, you may be dismissed from the clinic at the provider's discretion.      For prescription refill requests, have your pharmacy contact our office and allow 72 hours for refills to be completed.    Today you received the following: Venofer, return as scheduled.   To help prevent nausea and vomiting after your treatment, we encourage you to take your nausea medication as directed.  BELOW ARE SYMPTOMS THAT SHOULD BE REPORTED IMMEDIATELY: *FEVER GREATER THAN 100.4 F (38 C) OR HIGHER *CHILLS OR SWEATING *NAUSEA AND VOMITING THAT IS NOT CONTROLLED WITH YOUR NAUSEA MEDICATION *UNUSUAL SHORTNESS OF BREATH *UNUSUAL BRUISING OR BLEEDING *URINARY PROBLEMS (pain or burning when urinating, or frequent urination) *BOWEL PROBLEMS (unusual diarrhea, constipation, pain near the anus) TENDERNESS IN MOUTH AND THROAT WITH OR WITHOUT PRESENCE OF ULCERS (sore throat, sores in mouth, or a toothache) UNUSUAL RASH, SWELLING OR PAIN  UNUSUAL VAGINAL DISCHARGE OR ITCHING   Items with * indicate a potential emergency and should be followed up as soon as possible or go to the Emergency Department if any problems should occur.  Please show the CHEMOTHERAPY ALERT CARD or IMMUNOTHERAPY ALERT CARD at check-in to the Emergency Department and triage  nurse.  Should you have questions after your visit or need to cancel or reschedule your appointment, please contact [facility] [phone]  and follow the prompts.  Office hours are 8:00 a.m. to 4:30 p.m. Monday - Friday. Please note that voicemails left after 4:00 p.m. may not be returned until the following business day.  We are closed weekends and major holidays. You have access to a nurse at all times for urgent questions. Please call the main number to the clinic [phone] and follow the prompts.  For any non-urgent questions, you may also contact your provider using MyChart. We now offer e-Visits for anyone 49 and older to request care online for non-urgent symptoms. For details visit [email or web address].   Also download the MyChart app! Go to the app store, search "MyChart", open the app, select [facility], and log in with your MyChart username and password.  Due to Covid, a mask is required upon entering the hospital/clinic. If you do not have a mask, one will be given to you upon arrival. For doctor visits, patients may have 1 support person aged 58 or older with them. For treatment visits, patients cannot have anyone with them due to current Covid guidelines and our immunocompromised population.

## 2020-10-09 NOTE — Progress Notes (Signed)
Patient tolerated iron infusion with no complaints voiced. Peripheral IV site clean and dry with good blood return noted before and after infusion. Band aid applied. VSS with discharge and left in satisfactory condition with no s/s of distress noted.

## 2020-10-15 ENCOUNTER — Telehealth: Payer: Medicare PPO

## 2020-10-16 ENCOUNTER — Inpatient Hospital Stay (HOSPITAL_COMMUNITY): Payer: Medicare PPO

## 2020-10-16 ENCOUNTER — Other Ambulatory Visit: Payer: Self-pay

## 2020-10-16 ENCOUNTER — Other Ambulatory Visit (HOSPITAL_COMMUNITY): Payer: Medicare PPO

## 2020-10-16 ENCOUNTER — Ambulatory Visit (HOSPITAL_COMMUNITY): Payer: Medicare PPO

## 2020-10-16 VITALS — BP 115/55 | HR 78 | Temp 98.9°F | Resp 18

## 2020-10-16 DIAGNOSIS — D509 Iron deficiency anemia, unspecified: Secondary | ICD-10-CM | POA: Diagnosis not present

## 2020-10-16 DIAGNOSIS — D500 Iron deficiency anemia secondary to blood loss (chronic): Secondary | ICD-10-CM

## 2020-10-16 DIAGNOSIS — Z79899 Other long term (current) drug therapy: Secondary | ICD-10-CM | POA: Diagnosis not present

## 2020-10-16 LAB — CBC WITH DIFFERENTIAL/PLATELET
Abs Immature Granulocytes: 0.01 10*3/uL (ref 0.00–0.07)
Basophils Absolute: 0 10*3/uL (ref 0.0–0.1)
Basophils Relative: 0 %
Eosinophils Absolute: 0.1 10*3/uL (ref 0.0–0.5)
Eosinophils Relative: 1 %
HCT: 30.8 % — ABNORMAL LOW (ref 36.0–46.0)
Hemoglobin: 9.1 g/dL — ABNORMAL LOW (ref 12.0–15.0)
Immature Granulocytes: 0 %
Lymphocytes Relative: 33 %
Lymphs Abs: 1.5 10*3/uL (ref 0.7–4.0)
MCH: 28.4 pg (ref 26.0–34.0)
MCHC: 29.5 g/dL — ABNORMAL LOW (ref 30.0–36.0)
MCV: 96.3 fL (ref 80.0–100.0)
Monocytes Absolute: 0.5 10*3/uL (ref 0.1–1.0)
Monocytes Relative: 12 %
Neutro Abs: 2.4 10*3/uL (ref 1.7–7.7)
Neutrophils Relative %: 54 %
Platelets: 101 10*3/uL — ABNORMAL LOW (ref 150–400)
RBC: 3.2 MIL/uL — ABNORMAL LOW (ref 3.87–5.11)
RDW: 19.3 % — ABNORMAL HIGH (ref 11.5–15.5)
WBC: 4.5 10*3/uL (ref 4.0–10.5)
nRBC: 0 % (ref 0.0–0.2)

## 2020-10-16 LAB — SAMPLE TO BLOOD BANK

## 2020-10-16 MED ORDER — SODIUM CHLORIDE 0.9 % IV SOLN: 300.0000 mg | Freq: Once | INTRAVENOUS | Status: DC

## 2020-10-16 MED ORDER — SODIUM CHLORIDE 0.9 % IV SOLN
400.0000 mg | Freq: Once | INTRAVENOUS | Status: AC
  Administered 2020-10-16: 400 mg via INTRAVENOUS
  Filled 2020-10-16: qty 20

## 2020-10-16 MED ORDER — PEGFILGRASTIM-CBQV 6 MG/0.6ML ~~LOC~~ SOSY
PREFILLED_SYRINGE | SUBCUTANEOUS | Status: AC
  Filled 2020-10-16: qty 0.6

## 2020-10-16 MED ORDER — CYANOCOBALAMIN 1000 MCG/ML IJ SOLN
1000.0000 ug | Freq: Once | INTRAMUSCULAR | Status: AC
  Administered 2020-10-16: 1000 ug via INTRAMUSCULAR
  Filled 2020-10-16: qty 1

## 2020-10-16 MED ORDER — SODIUM CHLORIDE 0.9 % IV SOLN
Freq: Once | INTRAVENOUS | Status: AC
Start: 2020-10-16 — End: 2020-10-16

## 2020-10-16 NOTE — Progress Notes (Signed)
Chaplain engaged in a follow-up visit with [name].  [name] talked about going to church last Sunday and how much it helped her.  She was invited by another patient who is going through chemo right now.  She voiced how she felt renewed and replenished, and how she desires to go again.  [name] noted that everyone was so nice, kind and loving.  Chaplain could assess how church has left [name] with a newfound community and purpose.  She talked about loving how the preacher delivered the word of God and applied it to everyday life.    [name] has been in need of a community that will be there for her and meet her needs.  She is often trying to find those that will really help her.  Though [name] has family surrounding her, there is still a gap in the ways they show up for her.  [name] voiced needing her lawn mowed and needing more help around the house to keep it clean.  She is now embarking on the journey of redoing her home so that she will feel comfortable having people over.  She is taking it one day at a time to have her home become what she desires.  Chaplain celebrated the ways in which [name] is taking back control over her home.    [place] also talked about shifting her expectations of others and how she believes going to church may help her in doing that.  She wants to ask for more help from others instead of leaning on family who are not able to meet her needs, expectations or desires.  [place] also talked about setting boundaries, doing things for herself, and what it means to live life.    Chaplain offered listening, presence, and support.    10/16/20 1100  Clinical Encounter Type  Visited With Patient  Visit Type Follow-up

## 2020-10-16 NOTE — Progress Notes (Signed)
Pt presents today for IV iron infusion and B12 injection per provider's order. Vital signs stable pt voiced new no complaints at this time.  IV iron infusion given today per MD orders. Tolerated infusion without adverse affects. Vital signs stable. Pt advised to wait 30 minutes after infusion per protocol. Pt left prior to her 30 minute time pt verbalized understanding. No complaints at this time. Discharged from clinic via wheelchair in stable condition. Alert and oriented x 3. F/U with [facility] as scheduled.

## 2020-10-16 NOTE — Patient Instructions (Signed)
[facility]  Discharge Instructions: Thank you for choosing [facility] to provide your oncology and hematology care.  If you have a lab appointment with the [facility], please come in thru the Main Entrance and check in at the main information desk.  Wear comfortable clothing and clothing appropriate for easy access to any Portacath or PICC line.   We strive to give you quality time with your provider. You may need to reschedule your appointment if you arrive late (15 or more minutes).  Arriving late affects you and other patients whose appointments are after yours.  Also, if you miss three or more appointments without notifying the office, you may be dismissed from the clinic at the provider's discretion.      For prescription refill requests, have your pharmacy contact our office and allow 72 hours for refills to be completed.    Today you received IV iron infusion.   BELOW ARE SYMPTOMS THAT SHOULD BE REPORTED IMMEDIATELY: *FEVER GREATER THAN 100.4 F (38 C) OR HIGHER *CHILLS OR SWEATING *NAUSEA AND VOMITING THAT IS NOT CONTROLLED WITH YOUR NAUSEA MEDICATION *UNUSUAL SHORTNESS OF BREATH *UNUSUAL BRUISING OR BLEEDING *URINARY PROBLEMS (pain or burning when urinating, or frequent urination) *BOWEL PROBLEMS (unusual diarrhea, constipation, pain near the anus) TENDERNESS IN MOUTH AND THROAT WITH OR WITHOUT PRESENCE OF ULCERS (sore throat, sores in mouth, or a toothache) UNUSUAL RASH, SWELLING OR PAIN  UNUSUAL VAGINAL DISCHARGE OR ITCHING   Items with * indicate a potential emergency and should be followed up as soon as possible or go to the Emergency Department if any problems should occur.  Please show the CHEMOTHERAPY ALERT CARD or IMMUNOTHERAPY ALERT CARD at check-in to the Emergency Department and triage nurse.  Should you have questions after your visit or need to cancel or reschedule your appointment, please contact [facility] [phone]   and follow the prompts.  Office hours are 8:00 a.m. to 4:30 p.m. Monday - Friday. Please note that voicemails left after 4:00 p.m. may not be returned until the following business day.  We are closed weekends and major holidays. You have access to a nurse at all times for urgent questions. Please call the main number to the clinic [phone] and follow the prompts.  For any non-urgent questions, you may also contact your provider using MyChart. We now offer e-Visits for anyone 54 and older to request care online for non-urgent symptoms. For details visit [email or web address].   Also download the MyChart app! Go to the app store, search "MyChart", open the app, select [facility], and log in with your MyChart username and password.  Due to Covid, a mask is required upon entering the hospital/clinic. If you do not have a mask, one will be given to you upon arrival. For doctor visits, patients may have 1 support person aged 64 or older with them. For treatment visits, patients cannot have anyone with them due to current Covid guidelines and our immunocompromised population.

## 2020-10-23 DIAGNOSIS — J449 Chronic obstructive pulmonary disease, unspecified: Secondary | ICD-10-CM | POA: Diagnosis not present

## 2020-10-26 ENCOUNTER — Ambulatory Visit (INDEPENDENT_AMBULATORY_CARE_PROVIDER_SITE_OTHER): Payer: Medicare PPO | Admitting: Licensed Clinical Social Worker

## 2020-10-26 DIAGNOSIS — I152 Hypertension secondary to endocrine disorders: Secondary | ICD-10-CM

## 2020-10-26 DIAGNOSIS — D649 Anemia, unspecified: Secondary | ICD-10-CM | POA: Diagnosis not present

## 2020-10-26 DIAGNOSIS — D500 Iron deficiency anemia secondary to blood loss (chronic): Secondary | ICD-10-CM

## 2020-10-26 DIAGNOSIS — F339 Major depressive disorder, recurrent, unspecified: Secondary | ICD-10-CM | POA: Diagnosis not present

## 2020-10-26 DIAGNOSIS — E1169 Type 2 diabetes mellitus with other specified complication: Secondary | ICD-10-CM | POA: Diagnosis not present

## 2020-10-26 DIAGNOSIS — J449 Chronic obstructive pulmonary disease, unspecified: Secondary | ICD-10-CM

## 2020-10-26 DIAGNOSIS — E66813 Obesity, class 3: Secondary | ICD-10-CM

## 2020-10-26 DIAGNOSIS — E1159 Type 2 diabetes mellitus with other circulatory complications: Secondary | ICD-10-CM | POA: Diagnosis not present

## 2020-10-26 DIAGNOSIS — E785 Hyperlipidemia, unspecified: Secondary | ICD-10-CM

## 2020-10-26 DIAGNOSIS — E669 Obesity, unspecified: Secondary | ICD-10-CM

## 2020-10-26 NOTE — Chronic Care Management (AMB) (Signed)
Chronic Care Management    Clinical Social Work Note  10/26/2020 Name: Savannah Burns MRN: [id number] DOB: 1950-08-20  Savannah Burns is a 70 y.o. year old female who is a primary care patient of [name], DO. The CCM team was consulted to assist the patient with chronic disease management and/or care coordination needs related to: [employer] .   Engaged with patient by telephone for follow up visit in response to provider referral for social work chronic care management and care coordination services.   Consent to Services:  The patient was given information about Chronic Care Management services, agreed to services, and gave verbal consent prior to initiation of services.  Please see initial visit note for detailed documentation.   Patient agreed to services and consent obtained.   Assessment: Review of patient past medical history, allergies, medications, and health status, including review of relevant consultants reports was performed today as part of a comprehensive evaluation and provision of chronic care management and care coordination services.     SDOH (Social Determinants of Health) assessments and interventions performed:  GAD-7 done; PhQ-9 done. Client spoke of depression issues and stress issues. She has prescribed medications. LCSW informed client of RNCM support and of LCSW support. Client said that sometimes she does not take her medications on schedule.    Advanced Directives Status: See Vynca application for related entries.  CCM Care Plan  Allergies  Allergen Reactions   Keflex [Cephalexin] Nausea And Vomiting    Outpatient Encounter Medications as of 10/26/2020  Medication Sig Note   Blood Glucose Calibration (GLUCOMETER DEX HIGH CONTROL) LIQD Test CBG's once daily.     Blood Glucose Monitoring Suppl (TRUE METRIX AIR GLUCOSE METER) w/Device KIT Check BS daily Dx E11.9    Cholecalciferol (VITAMIN D) 50 MCG (2000 UT) tablet Take 4,000 Units  by mouth daily.    erythromycin ophthalmic ointment Place 1 application into the left eye at bedtime.    escitalopram (LEXAPRO) 10 MG tablet Take 1 tablet (10 mg total) by mouth daily.    ferrous sulfate 325 (65 FE) MG tablet Take 1 tablet (325 mg total) by mouth 2 (two) times daily with a meal. (Patient taking differently: Take 325 mg by mouth daily with breakfast.)    furosemide (LASIX) 20 MG tablet Take 20 mg by mouth. bid    glucose blood (TRUE METRIX BLOOD GLUCOSE TEST) test strip Check BS daily Dx E11.9    lisinopril (ZESTRIL) 10 MG tablet Take 1 tablet by mouth once daily (Patient taking differently: Take 10 mg by mouth daily.)    lovastatin (MEVACOR) 20 MG tablet Take 1 tablet (20 mg total) by mouth at bedtime.    metFORMIN (GLUCOPHAGE) 1000 MG tablet Take 1 tablet (1,000 mg total) by mouth daily with breakfast.    nadolol (CORGARD) 20 MG tablet Take 1 tablet (20 mg total) by mouth daily. 08/04/2020: No longer taking   nystatin cream (MYCOSTATIN) Apply 1 application topically 2 (two) times daily. X14 days or until rash resolved    oxyCODONE-acetaminophen (PERCOCET) 10-325 MG tablet Take 1 tablet by mouth every 6 (six) hours as needed for pain.    OXYGEN Inhale 3 L into the lungs continuous.    pantoprazole (PROTONIX) 40 MG tablet Take 1 tablet (40 mg total) by mouth daily.    potassium chloride SA (KLOR-CON) 20 MEQ tablet Take 2 tablets (40 mEq total) by mouth 2 (two) times daily. (Patient taking differently: Take 40 mEq by mouth daily.)  torsemide (DEMADEX) 20 MG tablet Take 3 tablets (60 mg total) by mouth 2 (two) times daily. (Patient taking differently: Take 50 mg by mouth 2 (two) times daily.)    TRUEplus Lancets 30G MISC Check BS daily Dx E11.9    vitamin B-12 (CYANOCOBALAMIN) 1000 MCG tablet Take 2,000 mcg by mouth daily.    [DISCONTINUED] albuterol (PROVENTIL HFA;VENTOLIN HFA) 108 (90 Base) MCG/ACT inhaler Inhale 2 puffs into the lungs every 6 (six) hours as needed for wheezing or  shortness of breath.    No facility-administered encounter medications on file as of 10/26/2020.    Patient Active Problem List   Diagnosis Date Noted   Hypoalbuminemia 05/17/2020   Moderate protein malnutrition ([place]) 05/17/2020   Melena 05/16/2020   Hyperlipidemia    Hypokalemia    GI bleed 03/20/2020   Lower GI bleed 03/19/2020   Acute on chronic congestive heart failure (HCC)    Symptomatic anemia    Iron deficiency anemia    Heme positive stool    ABLA (acute blood loss anemia) 01/22/2020   Supplemental oxygen dependent 10/22/2019   Chronic dyspnea 10/22/2019   Hypertension associated with diabetes ([place]) 10/22/2019   Bilateral hand pain 09/21/2016   Fatigue 07/30/2015   Chronic diastolic HF (heart failure) ([place]) 06/29/2015   Postmenopausal bleeding 04/17/2015   Excessive daytime sleepiness 12/19/2014   Swelling of lower extremity 11/27/2014   Back pain 06/13/2013   Sinusitis, chronic 05/30/2012   Allergic rhinitis 06/06/2011   Hyperlipidemia associated with type 2 diabetes mellitus ([place]) 03/24/2009   Diabetes mellitus, type 2 ([place]) 08/12/2008   Pulmonary nodule 08/29/2007   COPD (chronic obstructive pulmonary disease) ([place]) 02/14/2007   Class 3 obesity 05/25/2006   TOBACCO DEPENDENCE 05/25/2006   Depression, recurrent ([place]) 05/25/2006   HYPERTENSION, BENIGN SYSTEMIC 05/25/2006   DJD, UNSPECIFIED 05/25/2006    Conditions to be addressed/monitored: monitor client management of depression issues faced   Care Plan : LCSW care plan  Updates made by Katha Cabal, LCSW since 10/26/2020 12:00 AM     Problem: Emotional Distress      Goal: Emotional Health Supported   Start Date: 10/26/2020  Expected End Date: 01/26/2021  This Visit's Progress: On track  Priority: Medium  Note:   Current Barriers:  Chronic Mental Health needs related to depression and anxiety issues Pain issues Mobility issues Suicidal Ideation/Homicidal Ideation: No  Clinical Social Work  Goal(s):  patient will work with SW monthly by telephone or in person to reduce or manage symptoms related to anxiety and depression management Patient will call RNCM as needed for nursing support in next 30 days Client will attend all scheduled medical appointments in next 30 days  Interventions: 1:1 collaboration with [name], DO regarding development and update of comprehensive plan of care as evidenced by provider attestation and co-signature Talked with Savannah Burns about pain issues of client Talked with Savannah Burns about sleeping issues of client (she said she feels that she is sleeping more at present) Talked with client about appetite of client  Talked with client about mobility of client  Talked with client about medication procurement Talked with Savannah Burns about home repair needs.  She recently had hot water heater installed which is helpful but was fairly expensive to her in paying for this expense Talked with Savannah Burns about her transport needs Talked with client about her mood (client has sad mood occasionally; she feels that financial issues are stressful at present. She said sometimes it is hard to pay copay amounts  for her medical visit.  She talked of family stress issues of concern.  She has her prescribed medications but said she sometimes may not take her prescribed medications on schedule.  She said that sometimes she may not take her prescribed medications  for several days at a time.  She said she does not want to become dependent  on pain medication.  Talked with client about decreased energy of client Talked with Savannah Burns about her recent discussion with DSS Medicaid worker regarding Medicaid application process and qualifying for Medicaid benefits Encouraged client to call RNCM as needed for nursing support Provided counseling support for client Talked with client about relaxation techniques of client  Patient Self Care Activities:  Self administers medications as  prescribed Attends all scheduled provider appointments Performs ADL's independently  Patient Coping Strengths:  [place]  Patient Self Care Deficits:  Mobility challenges Financial challenges Depression issues  Patient Goals:  - spend time or talk with others at least 2 to 3 times per week - practice relaxation or meditation daily - keep a calendar with appointment dates  Follow Up Plan: LCSW to call client on 12/03/20    [name].Loleta Frommelt MSW, LCSW Licensed Clinical Social Worker [facility] Care Management [phone]

## 2020-10-26 NOTE — Patient Instructions (Signed)
Visit Information  PATIENT GOALS:  Goals Addressed             This Visit's Progress    Manage My Emotions       Protect My Health;Manage Depression issues faced       Timeframe:  Short-Term Goal Priority:  Medium Progress: On Track Start Date:              10/26/20               Expected End Date:            01/26/21           Follow Up Date 12/03/20   Protect My Health (Patient) Manage Depression issues    Why is this important?   Screening tests can find diseases early when they are easier to treat.  Your doctor or nurse will talk with you about which tests are important for you.  Getting shots for common diseases like the flu and shingles will help prevent them.     Patient Self Care Activities:  Self administers medications as prescribed Attends all scheduled provider appointments Performs ADL's independently  Patient Coping Strengths:  Family Friends  Patient Self Care Deficits:  Depression issues  Patient Goals:  - spend time or talk with others at least 2 to 3 times per week - practice relaxation or meditation daily - keep a calendar with appointment dates  Follow Up Plan: LCSW to call client on 12/03/20 to assess client needs     [name].Ulas Zuercher MSW, LCSW Licensed Clinical Social Worker [facility] Care Management [phone]

## 2020-10-30 DIAGNOSIS — M5136 Other intervertebral disc degeneration, lumbar region: Secondary | ICD-10-CM | POA: Diagnosis not present

## 2020-10-30 DIAGNOSIS — Z6841 Body Mass Index (BMI) 40.0 and over, adult: Secondary | ICD-10-CM | POA: Diagnosis not present

## 2020-10-30 DIAGNOSIS — G894 Chronic pain syndrome: Secondary | ICD-10-CM | POA: Diagnosis not present

## 2020-10-30 DIAGNOSIS — Z79899 Other long term (current) drug therapy: Secondary | ICD-10-CM | POA: Diagnosis not present

## 2020-11-13 ENCOUNTER — Inpatient Hospital Stay (HOSPITAL_COMMUNITY): Payer: Medicare PPO | Attending: Hematology

## 2020-11-13 ENCOUNTER — Other Ambulatory Visit: Payer: Self-pay

## 2020-11-13 ENCOUNTER — Inpatient Hospital Stay (HOSPITAL_COMMUNITY): Payer: Medicare PPO

## 2020-11-13 VITALS — BP 151/59 | HR 74 | Temp 98.5°F | Resp 18

## 2020-11-13 DIAGNOSIS — D472 Monoclonal gammopathy: Secondary | ICD-10-CM

## 2020-11-13 DIAGNOSIS — E538 Deficiency of other specified B group vitamins: Secondary | ICD-10-CM | POA: Diagnosis not present

## 2020-11-13 DIAGNOSIS — D509 Iron deficiency anemia, unspecified: Secondary | ICD-10-CM | POA: Diagnosis not present

## 2020-11-13 DIAGNOSIS — C9000 Multiple myeloma not having achieved remission: Secondary | ICD-10-CM

## 2020-11-13 DIAGNOSIS — D500 Iron deficiency anemia secondary to blood loss (chronic): Secondary | ICD-10-CM

## 2020-11-13 DIAGNOSIS — Z79899 Other long term (current) drug therapy: Secondary | ICD-10-CM | POA: Diagnosis not present

## 2020-11-13 LAB — CBC WITH DIFFERENTIAL/PLATELET
Abs Immature Granulocytes: 0.01 10*3/uL (ref 0.00–0.07)
Basophils Absolute: 0 10*3/uL (ref 0.0–0.1)
Basophils Relative: 0 %
Eosinophils Absolute: 0 10*3/uL (ref 0.0–0.5)
Eosinophils Relative: 1 %
HCT: 31.2 % — ABNORMAL LOW (ref 36.0–46.0)
Hemoglobin: 9.4 g/dL — ABNORMAL LOW (ref 12.0–15.0)
Immature Granulocytes: 0 %
Lymphocytes Relative: 20 %
Lymphs Abs: 0.9 10*3/uL (ref 0.7–4.0)
MCH: 29.2 pg (ref 26.0–34.0)
MCHC: 30.1 g/dL (ref 30.0–36.0)
MCV: 96.9 fL (ref 80.0–100.0)
Monocytes Absolute: 0.4 10*3/uL (ref 0.1–1.0)
Monocytes Relative: 9 %
Neutro Abs: 3 10*3/uL (ref 1.7–7.7)
Neutrophils Relative %: 70 %
Platelets: 81 10*3/uL — ABNORMAL LOW (ref 150–400)
RBC: 3.22 MIL/uL — ABNORMAL LOW (ref 3.87–5.11)
RDW: 18.4 % — ABNORMAL HIGH (ref 11.5–15.5)
WBC: 4.2 10*3/uL (ref 4.0–10.5)
nRBC: 0 % (ref 0.0–0.2)

## 2020-11-13 LAB — COMPREHENSIVE METABOLIC PANEL
ALT: 17 U/L (ref 0–44)
AST: 22 U/L (ref 15–41)
Albumin: 2.8 g/dL — ABNORMAL LOW (ref 3.5–5.0)
Alkaline Phosphatase: 99 U/L (ref 38–126)
Anion gap: 5 (ref 5–15)
BUN: 16 mg/dL (ref 8–23)
CO2: 31 mmol/L (ref 22–32)
Calcium: 8.9 mg/dL (ref 8.9–10.3)
Chloride: 101 mmol/L (ref 98–111)
Creatinine, Ser: 0.67 mg/dL (ref 0.44–1.00)
GFR, Estimated: >60 mL/min (ref >60)
Glucose, Bld: 207 mg/dL — ABNORMAL HIGH (ref 70–99)
Potassium: 3.6 mmol/L (ref 3.5–5.1)
Sodium: 137 mmol/L (ref 135–145)
Total Bilirubin: 0.2 mg/dL — ABNORMAL LOW (ref 0.3–1.2)
Total Protein: 8 g/dL (ref 6.5–8.1)

## 2020-11-13 LAB — IRON AND TIBC
Iron: 39 ug/dL (ref 28–170)
Saturation Ratios: 9 % — ABNORMAL LOW (ref 10.4–31.8)
TIBC: 439 ug/dL (ref 250–450)
UIBC: 400 ug/dL

## 2020-11-13 LAB — FERRITIN: Ferritin: 31 ng/mL (ref 11–307)

## 2020-11-13 LAB — SAMPLE TO BLOOD BANK

## 2020-11-13 LAB — VITAMIN B12: Vitamin B-12: 524 pg/mL (ref 180–914)

## 2020-11-13 LAB — LACTATE DEHYDROGENASE: LDH: 98 U/L (ref 98–192)

## 2020-11-13 MED ORDER — CYANOCOBALAMIN 1000 MCG/ML IJ SOLN
1000.0000 ug | Freq: Once | INTRAMUSCULAR | Status: AC
  Administered 2020-11-13: 1000 ug via INTRAMUSCULAR
  Filled 2020-11-13: qty 1

## 2020-11-13 NOTE — Patient Instructions (Signed)
[facility]  Discharge Instructions: Thank you for choosing [facility] to provide your oncology and hematology care.  If you have a lab appointment with the [facility], please come in thru the Main Entrance and check in at the main information desk.  Wear comfortable clothing and clothing appropriate for easy access to any Portacath or PICC line.   We strive to give you quality time with your provider. You may need to reschedule your appointment if you arrive late (15 or more minutes).  Arriving late affects you and other patients whose appointments are after yours.  Also, if you miss three or more appointments without notifying the office, you may be dismissed from the clinic at the provider's discretion.      For prescription refill requests, have your pharmacy contact our office and allow 72 hours for refills to be completed.    Today you received B12 injections    BELOW ARE SYMPTOMS THAT SHOULD BE REPORTED IMMEDIATELY: *FEVER GREATER THAN 100.4 F (38 C) OR HIGHER *CHILLS OR SWEATING *NAUSEA AND VOMITING THAT IS NOT CONTROLLED WITH YOUR NAUSEA MEDICATION *UNUSUAL SHORTNESS OF BREATH *UNUSUAL BRUISING OR BLEEDING *URINARY PROBLEMS (pain or burning when urinating, or frequent urination) *BOWEL PROBLEMS (unusual diarrhea, constipation, pain near the anus) TENDERNESS IN MOUTH AND THROAT WITH OR WITHOUT PRESENCE OF ULCERS (sore throat, sores in mouth, or a toothache) UNUSUAL RASH, SWELLING OR PAIN  UNUSUAL VAGINAL DISCHARGE OR ITCHING   Items with * indicate a potential emergency and should be followed up as soon as possible or go to the Emergency Department if any problems should occur.  Please show the CHEMOTHERAPY ALERT CARD or IMMUNOTHERAPY ALERT CARD at check-in to the Emergency Department and triage nurse.  Should you have questions after your visit or need to cancel or reschedule your appointment, please contact [facility] [phone]   and follow the prompts.  Office hours are 8:00 a.m. to 4:30 p.m. Monday - Friday. Please note that voicemails left after 4:00 p.m. may not be returned until the following business day.  We are closed weekends and major holidays. You have access to a nurse at all times for urgent questions. Please call the main number to the clinic [phone] and follow the prompts.  For any non-urgent questions, you may also contact your provider using MyChart. We now offer e-Visits for anyone 34 and older to request care online for non-urgent symptoms. For details visit [email or web address].   Also download the MyChart app! Go to the app store, search "MyChart", open the app, select [facility], and log in with your MyChart username and password.  Due to Covid, a mask is required upon entering the hospital/clinic. If you do not have a mask, one will be given to you upon arrival. For doctor visits, patients may have 1 support person aged 47 or older with them. For treatment visits, patients cannot have anyone with them due to current Covid guidelines and our immunocompromised population.

## 2020-11-13 NOTE — Progress Notes (Signed)
.  [place] presents today for injection per the provider's orders.  B12 administration without incident; injection site WNL; see MAR for injection details.  Patient tolerated procedure well and without incident.  No questions or complaints noted at this time. Pt stable during and after treatment. Pt discharged via wheelchair in satisfactory condition.

## 2020-11-16 LAB — METHYLMALONIC ACID, SERUM: Methylmalonic Acid, Quantitative: 157 nmol/L (ref 0–378)

## 2020-11-16 LAB — PROTEIN ELECTROPHORESIS, SERUM
A/G Ratio: 0.6 — ABNORMAL LOW (ref 0.7–1.7)
Albumin ELP: 2.9 g/dL (ref 2.9–4.4)
Alpha-1-Globulin: 0.3 g/dL (ref 0.0–0.4)
Alpha-2-Globulin: 0.7 g/dL (ref 0.4–1.0)
Beta Globulin: 1 g/dL (ref 0.7–1.3)
Gamma Globulin: 2.7 g/dL — ABNORMAL HIGH (ref 0.4–1.8)
Globulin, Total: 4.7 g/dL — ABNORMAL HIGH (ref 2.2–3.9)
M-Spike, %: 2.2 g/dL — ABNORMAL HIGH
Total Protein ELP: 7.6 g/dL (ref 6.0–8.5)

## 2020-11-16 LAB — KAPPA/LAMBDA LIGHT CHAINS
Kappa free light chain: 57.8 mg/L — ABNORMAL HIGH (ref 3.3–19.4)
Kappa, lambda light chain ratio: 3.52 — ABNORMAL HIGH (ref 0.26–1.65)
Lambda free light chains: 16.4 mg/L (ref 5.7–26.3)

## 2020-11-23 DIAGNOSIS — J449 Chronic obstructive pulmonary disease, unspecified: Secondary | ICD-10-CM | POA: Diagnosis not present

## 2020-12-02 DIAGNOSIS — M5136 Other intervertebral disc degeneration, lumbar region: Secondary | ICD-10-CM | POA: Diagnosis not present

## 2020-12-02 DIAGNOSIS — Z6841 Body Mass Index (BMI) 40.0 and over, adult: Secondary | ICD-10-CM | POA: Diagnosis not present

## 2020-12-02 DIAGNOSIS — G894 Chronic pain syndrome: Secondary | ICD-10-CM | POA: Diagnosis not present

## 2020-12-02 DIAGNOSIS — F1120 Opioid dependence, uncomplicated: Secondary | ICD-10-CM | POA: Diagnosis not present

## 2020-12-02 DIAGNOSIS — E119 Type 2 diabetes mellitus without complications: Secondary | ICD-10-CM | POA: Diagnosis not present

## 2020-12-03 ENCOUNTER — Ambulatory Visit (INDEPENDENT_AMBULATORY_CARE_PROVIDER_SITE_OTHER): Payer: Medicare PPO | Admitting: Licensed Clinical Social Worker

## 2020-12-03 DIAGNOSIS — I152 Hypertension secondary to endocrine disorders: Secondary | ICD-10-CM

## 2020-12-03 DIAGNOSIS — E1169 Type 2 diabetes mellitus with other specified complication: Secondary | ICD-10-CM

## 2020-12-03 DIAGNOSIS — E669 Obesity, unspecified: Secondary | ICD-10-CM

## 2020-12-03 DIAGNOSIS — E1159 Type 2 diabetes mellitus with other circulatory complications: Secondary | ICD-10-CM

## 2020-12-03 DIAGNOSIS — J449 Chronic obstructive pulmonary disease, unspecified: Secondary | ICD-10-CM

## 2020-12-03 DIAGNOSIS — E66813 Obesity, class 3: Secondary | ICD-10-CM

## 2020-12-03 DIAGNOSIS — I5032 Chronic diastolic (congestive) heart failure: Secondary | ICD-10-CM

## 2020-12-03 DIAGNOSIS — F339 Major depressive disorder, recurrent, unspecified: Secondary | ICD-10-CM

## 2020-12-03 DIAGNOSIS — D500 Iron deficiency anemia secondary to blood loss (chronic): Secondary | ICD-10-CM

## 2020-12-03 NOTE — Chronic Care Management (AMB) (Signed)
Chronic Care Management    Clinical Social Work Note  12/03/2020 Name: Savannah Burns MRN: [id number] DOB: 13-Dec-1950  Savannah Burns is a 70 y.o. year old female who is a primary care patient of [name], DO. The CCM team was consulted to assist the patient with chronic disease management and/or care coordination needs related to: [employer] .   Engaged with patient by telephone for follow up visit in response to provider referral for social work chronic care management and care coordination services.   Consent to Services:  The patient was given information about Chronic Care Management services, agreed to services, and gave verbal consent prior to initiation of services.  Please see initial visit note for detailed documentation.   Patient agreed to services and consent obtained.   Assessment: Review of patient past medical history, allergies, medications, and health status, including review of relevant consultants reports was performed today as part of a comprehensive evaluation and provision of chronic care management and care coordination services.     SDOH (Social Determinants of Health) assessments and interventions performed:  SDOH Interventions    Flowsheet Row Most Recent Value  SDOH Interventions   Stress Interventions Provide Counseling  [client has stress related to managing her medical conditons]  Depression Interventions/Treatment  Medication        Advanced Directives Status: See Vynca application for related entries.  CCM Care Plan  Allergies  Allergen Reactions   Keflex [Cephalexin] Nausea And Vomiting    Outpatient Encounter Medications as of 12/03/2020  Medication Sig Note   Blood Glucose Calibration (GLUCOMETER DEX HIGH CONTROL) LIQD Test CBG's once daily.     Blood Glucose Monitoring Suppl (TRUE METRIX AIR GLUCOSE METER) w/Device KIT Check BS daily Dx E11.9    Cholecalciferol (VITAMIN D) 50 MCG (2000 UT) tablet Take 4,000 Units  by mouth daily.    erythromycin ophthalmic ointment Place 1 application into the left eye at bedtime.    escitalopram (LEXAPRO) 10 MG tablet Take 1 tablet (10 mg total) by mouth daily.    ferrous sulfate 325 (65 FE) MG tablet Take 1 tablet (325 mg total) by mouth 2 (two) times daily with a meal. (Patient taking differently: Take 325 mg by mouth daily with breakfast.)    furosemide (LASIX) 20 MG tablet Take 20 mg by mouth. bid    glucose blood (TRUE METRIX BLOOD GLUCOSE TEST) test strip Check BS daily Dx E11.9    lisinopril (ZESTRIL) 10 MG tablet Take 1 tablet by mouth once daily (Patient taking differently: Take 10 mg by mouth daily.)    lovastatin (MEVACOR) 20 MG tablet Take 1 tablet (20 mg total) by mouth at bedtime.    metFORMIN (GLUCOPHAGE) 1000 MG tablet Take 1 tablet (1,000 mg total) by mouth daily with breakfast.    nadolol (CORGARD) 20 MG tablet Take 1 tablet (20 mg total) by mouth daily. 08/04/2020: No longer taking   nystatin cream (MYCOSTATIN) Apply 1 application topically 2 (two) times daily. X14 days or until rash resolved    oxyCODONE-acetaminophen (PERCOCET) 10-325 MG tablet Take 1 tablet by mouth every 6 (six) hours as needed for pain.    OXYGEN Inhale 3 L into the lungs continuous.    pantoprazole (PROTONIX) 40 MG tablet Take 1 tablet (40 mg total) by mouth daily.    potassium chloride SA (KLOR-CON) 20 MEQ tablet Take 2 tablets (40 mEq total) by mouth 2 (two) times daily. (Patient taking differently: Take 40 mEq by mouth daily.)  torsemide (DEMADEX) 20 MG tablet Take 3 tablets (60 mg total) by mouth 2 (two) times daily. (Patient taking differently: Take 50 mg by mouth 2 (two) times daily.)    TRUEplus Lancets 30G MISC Check BS daily Dx E11.9    vitamin B-12 (CYANOCOBALAMIN) 1000 MCG tablet Take 2,000 mcg by mouth daily.    [DISCONTINUED] albuterol (PROVENTIL HFA;VENTOLIN HFA) 108 (90 Base) MCG/ACT inhaler Inhale 2 puffs into the lungs every 6 (six) hours as needed for wheezing or  shortness of breath.    No facility-administered encounter medications on file as of 12/03/2020.    Patient Active Problem List   Diagnosis Date Noted   Hypoalbuminemia 05/17/2020   Moderate protein malnutrition ([facility] [place] [facility]) 05/17/2020   Melena 05/16/2020   Hyperlipidemia    Hypokalemia    GI bleed 03/20/2020   Lower GI bleed 03/19/2020   Acute on chronic congestive heart failure (HCC)    Symptomatic anemia    Iron deficiency anemia    Heme positive stool    ABLA (acute blood loss anemia) 01/22/2020   Supplemental oxygen dependent 10/22/2019   Chronic dyspnea 10/22/2019   Hypertension associated with diabetes ([place]) 10/22/2019   Bilateral hand pain 09/21/2016   Fatigue 07/30/2015   Chronic diastolic HF (heart failure) ([place]) 06/29/2015   Postmenopausal bleeding 04/17/2015   Excessive daytime sleepiness 12/19/2014   Swelling of lower extremity 11/27/2014   Back pain 06/13/2013   Sinusitis, chronic 05/30/2012   Allergic rhinitis 06/06/2011   Hyperlipidemia associated with type 2 diabetes mellitus ([place]) 03/24/2009   Diabetes mellitus, type 2 ([place]) 08/12/2008   Pulmonary nodule 08/29/2007   COPD (chronic obstructive pulmonary disease) ([place]) 02/14/2007   Class 3 obesity 05/25/2006   TOBACCO DEPENDENCE 05/25/2006   Depression, recurrent ([place]) 05/25/2006   HYPERTENSION, BENIGN SYSTEMIC 05/25/2006   DJD, UNSPECIFIED 05/25/2006    Conditions to be addressed/monitored: monitor client management of anxiety and depression issues  Care Plan : LCSW Care Plan  Updates made by Katha Cabal, LCSW since 12/03/2020 12:00 AM     Problem: Emotional Distress      Goal: Emotional Health Supported   Start Date: 12/03/2020  Expected End Date: 03/02/2021  This Visit's Progress: On track  Priority: Medium  Note:   Current Barriers:  Chronic Mental Health needs related to depression and anxiety issues Low energy, edema in lower extremities, walking challenges Suicidal Ideation/Homicidal  Ideation: No  Clinical Social Work Goal(s):  patient will work with SW monthly by telephone or in person to reduce or manage symptoms related to anxiety and depression issues Client will call RNCM in next 30 days as needed to discuss nursing needs of client Client will call LCSW as needed in next 30 days to discuss anxiety or stress issues faced by client.  Interventions: 1:1 collaboration with [name], DO regarding development and update of comprehensive plan of care as evidenced by provider attestation and co-signature Talked with client about her B12 injections monthly.  Talked with client about energy level of client. She said she has reduced energy Talked with client about sleeping of client.  She said she thinks that when her hemoglobin level is low it makes her more sleepy. She reported that she had been sleeping more and resting more Talked with client about edema.  She said she has edema in her lower extremities. However, she said she had been taking Lasix more routinely in recent weeks Provided counseling support for client Talked with client about family support. She said she  has support from her granddaughter , Savannah Burns. Talked with client about ambulation of client. She said she uses a walker to help her walk Encouraged client to call RNCM as needed for CCM nursing support  Patient Self Care Activities:  Self administers medications as prescribed Attends all scheduled provider appointments  Patient Coping Strengths:  Loop  Patient Self Care Deficits:  Mobility issues Occasional dizziness Low energy  Patient Goals:  - spend time or talk with others at least 2 to 3 times per week - practice relaxation or meditation daily - keep a calendar with appointment dates  Follow Up Plan: LCSW to call client on 01/14/21 at 10:00 AM to assess client needs   [name].Rockelle Heuerman MSW, LCSW Licensed Clinical Social Worker [facility] Care Management [phone]

## 2020-12-03 NOTE — Patient Instructions (Signed)
Visit Information  PATIENT GOALS:  Goals Addressed             This Visit's Progress    Protect My Health;Manage Depression issues faced; Manage anxiety issues faced       Timeframe:  Short-Term Goal Priority:  Medium Progress: On Track Start Date:             12/03/20               Expected End Date:           03/02/21           Follow Up Date 01/14/21 at 10:00 AM   Protect My Health (Patient) Manage Depression issues . Manage anxiety issues faced   Why is this important?   Screening tests can find diseases early when they are easier to treat.  Your doctor or nurse will talk with you about which tests are important for you.  Getting shots for common diseases like the flu and shingles will help prevent them.     Patient Self Care Activities:  Self administers medications as prescribed Attends all scheduled provider appointments Performs ADL's independently  Patient Coping Strengths:  [employer] friends  Patient Self Care Deficits:  Depression issues Mobility issues Low energy  Patient Goals:  - spend time or talk with others at least 2 to 3 times per week - practice relaxation or meditation daily - keep a calendar with appointment dates  Follow Up Plan: LCSW to call client on 01/14/21 at 10:00 AM to assess client needs     [name].Timm Bonenberger MSW, LCSW Licensed Clinical Social Worker [facility] Care Management [phone]

## 2020-12-04 ENCOUNTER — Inpatient Hospital Stay (HOSPITAL_COMMUNITY): Payer: Medicare PPO | Attending: Physician Assistant

## 2020-12-04 ENCOUNTER — Other Ambulatory Visit (HOSPITAL_COMMUNITY): Payer: Self-pay | Admitting: Physician Assistant

## 2020-12-04 ENCOUNTER — Other Ambulatory Visit: Payer: Self-pay

## 2020-12-04 DIAGNOSIS — E119 Type 2 diabetes mellitus without complications: Secondary | ICD-10-CM | POA: Diagnosis not present

## 2020-12-04 DIAGNOSIS — E785 Hyperlipidemia, unspecified: Secondary | ICD-10-CM | POA: Diagnosis not present

## 2020-12-04 DIAGNOSIS — I10 Essential (primary) hypertension: Secondary | ICD-10-CM | POA: Diagnosis not present

## 2020-12-04 DIAGNOSIS — D500 Iron deficiency anemia secondary to blood loss (chronic): Secondary | ICD-10-CM

## 2020-12-04 DIAGNOSIS — C9000 Multiple myeloma not having achieved remission: Secondary | ICD-10-CM | POA: Insufficient documentation

## 2020-12-04 DIAGNOSIS — D3502 Benign neoplasm of left adrenal gland: Secondary | ICD-10-CM | POA: Insufficient documentation

## 2020-12-04 DIAGNOSIS — D509 Iron deficiency anemia, unspecified: Secondary | ICD-10-CM | POA: Insufficient documentation

## 2020-12-04 DIAGNOSIS — Z85828 Personal history of other malignant neoplasm of skin: Secondary | ICD-10-CM | POA: Insufficient documentation

## 2020-12-04 LAB — CBC WITH DIFFERENTIAL/PLATELET
Abs Immature Granulocytes: 0.03 10*3/uL (ref 0.00–0.07)
Basophils Absolute: 0 10*3/uL (ref 0.0–0.1)
Basophils Relative: 0 %
Eosinophils Absolute: 0 10*3/uL (ref 0.0–0.5)
Eosinophils Relative: 1 %
HCT: 25.2 % — ABNORMAL LOW (ref 36.0–46.0)
Hemoglobin: 7.3 g/dL — ABNORMAL LOW (ref 12.0–15.0)
Immature Granulocytes: 1 %
Lymphocytes Relative: 22 %
Lymphs Abs: 1.1 10*3/uL (ref 0.7–4.0)
MCH: 27.8 pg (ref 26.0–34.0)
MCHC: 29 g/dL — ABNORMAL LOW (ref 30.0–36.0)
MCV: 95.8 fL (ref 80.0–100.0)
Monocytes Absolute: 0.5 10*3/uL (ref 0.1–1.0)
Monocytes Relative: 9 %
Neutro Abs: 3.3 10*3/uL (ref 1.7–7.7)
Neutrophils Relative %: 67 %
Platelets: 92 10*3/uL — ABNORMAL LOW (ref 150–400)
RBC: 2.63 MIL/uL — ABNORMAL LOW (ref 3.87–5.11)
RDW: 18.2 % — ABNORMAL HIGH (ref 11.5–15.5)
WBC: 4.9 10*3/uL (ref 4.0–10.5)
nRBC: 0 % (ref 0.0–0.2)

## 2020-12-04 LAB — COMPREHENSIVE METABOLIC PANEL
ALT: 18 U/L (ref 0–44)
AST: 27 U/L (ref 15–41)
Albumin: 2.9 g/dL — ABNORMAL LOW (ref 3.5–5.0)
Alkaline Phosphatase: 86 U/L (ref 38–126)
Anion gap: 8 (ref 5–15)
BUN: 19 mg/dL (ref 8–23)
CO2: 26 mmol/L (ref 22–32)
Calcium: 8.3 mg/dL — ABNORMAL LOW (ref 8.9–10.3)
Chloride: 103 mmol/L (ref 98–111)
Creatinine, Ser: 0.63 mg/dL (ref 0.44–1.00)
GFR, Estimated: >60 mL/min (ref >60)
Glucose, Bld: 172 mg/dL — ABNORMAL HIGH (ref 70–99)
Potassium: 3.8 mmol/L (ref 3.5–5.1)
Sodium: 137 mmol/L (ref 135–145)
Total Bilirubin: 0.4 mg/dL (ref 0.3–1.2)
Total Protein: 7.7 g/dL (ref 6.5–8.1)

## 2020-12-04 LAB — IRON AND TIBC
Iron: 25 ug/dL — ABNORMAL LOW (ref 28–170)
Saturation Ratios: 6 % — ABNORMAL LOW (ref 10.4–31.8)
TIBC: 450 ug/dL (ref 250–450)
UIBC: 425 ug/dL

## 2020-12-04 LAB — LACTATE DEHYDROGENASE: LDH: 104 U/L (ref 98–192)

## 2020-12-04 LAB — SAMPLE TO BLOOD BANK

## 2020-12-04 LAB — FERRITIN: Ferritin: 18 ng/mL (ref 11–307)

## 2020-12-04 LAB — PREPARE RBC (CROSSMATCH)

## 2020-12-04 LAB — VITAMIN B12: Vitamin B-12: 514 pg/mL (ref 180–914)

## 2020-12-07 ENCOUNTER — Other Ambulatory Visit: Payer: Self-pay

## 2020-12-07 ENCOUNTER — Telehealth: Payer: Self-pay | Admitting: *Deleted

## 2020-12-07 ENCOUNTER — Encounter (HOSPITAL_COMMUNITY): Payer: Self-pay

## 2020-12-07 ENCOUNTER — Inpatient Hospital Stay (HOSPITAL_COMMUNITY): Payer: Medicare PPO

## 2020-12-07 ENCOUNTER — Telehealth: Payer: Medicare PPO | Admitting: *Deleted

## 2020-12-07 DIAGNOSIS — C9000 Multiple myeloma not having achieved remission: Secondary | ICD-10-CM | POA: Diagnosis not present

## 2020-12-07 DIAGNOSIS — I10 Essential (primary) hypertension: Secondary | ICD-10-CM | POA: Diagnosis not present

## 2020-12-07 DIAGNOSIS — E119 Type 2 diabetes mellitus without complications: Secondary | ICD-10-CM | POA: Diagnosis not present

## 2020-12-07 DIAGNOSIS — D509 Iron deficiency anemia, unspecified: Secondary | ICD-10-CM | POA: Diagnosis not present

## 2020-12-07 DIAGNOSIS — E785 Hyperlipidemia, unspecified: Secondary | ICD-10-CM | POA: Diagnosis not present

## 2020-12-07 DIAGNOSIS — Z85828 Personal history of other malignant neoplasm of skin: Secondary | ICD-10-CM | POA: Diagnosis not present

## 2020-12-07 DIAGNOSIS — D3502 Benign neoplasm of left adrenal gland: Secondary | ICD-10-CM | POA: Diagnosis not present

## 2020-12-07 DIAGNOSIS — D500 Iron deficiency anemia secondary to blood loss (chronic): Secondary | ICD-10-CM

## 2020-12-07 LAB — KAPPA/LAMBDA LIGHT CHAINS
Kappa free light chain: 71.9 mg/L — ABNORMAL HIGH (ref 3.3–19.4)
Kappa, lambda light chain ratio: 4.16 — ABNORMAL HIGH (ref 0.26–1.65)
Lambda free light chains: 17.3 mg/L (ref 5.7–26.3)

## 2020-12-07 LAB — METHYLMALONIC ACID, SERUM: Methylmalonic Acid, Quantitative: 127 nmol/L (ref 0–378)

## 2020-12-07 MED ORDER — SODIUM CHLORIDE 0.9% FLUSH: 10.0000 mL | INTRAVENOUS | Status: DC | PRN

## 2020-12-07 MED ORDER — SODIUM CHLORIDE 0.9% IV SOLUTION
250.0000 mL | Freq: Once | INTRAVENOUS | Status: AC
  Administered 2020-12-07: 250 mL via INTRAVENOUS

## 2020-12-07 MED ORDER — ACETAMINOPHEN 325 MG PO TABS
650.0000 mg | ORAL_TABLET | Freq: Once | ORAL | Status: AC
  Administered 2020-12-07: 650 mg via ORAL
  Filled 2020-12-07: qty 2

## 2020-12-07 MED ORDER — DIPHENHYDRAMINE HCL 25 MG PO CAPS
25.0000 mg | ORAL_CAPSULE | Freq: Once | ORAL | Status: AC
  Administered 2020-12-07: 25 mg via ORAL
  Filled 2020-12-07: qty 1

## 2020-12-07 NOTE — Patient Instructions (Signed)
[facility]  Discharge Instructions: Thank you for choosing [facility] to provide your oncology and hematology care.  If you have a lab appointment with the [facility], please come in thru the Main Entrance and check in at the main information desk.  Wear comfortable clothing and clothing appropriate for easy access to any Portacath or PICC line.   We strive to give you quality time with your provider. You may need to reschedule your appointment if you arrive late (15 or more minutes).  Arriving late affects you and other patients whose appointments are after yours.  Also, if you miss three or more appointments without notifying the office, you may be dismissed from the clinic at the provider's discretion.      For prescription refill requests, have your pharmacy contact our office and allow 72 hours for refills to be completed.    Today you received the following 1 unit of PRBCs, return as scheduled. Savannah Burns advises patient to take temperature 3 times per day to monitor temperature at home. Go to emergency room if temperatures    To help prevent nausea and vomiting after your treatment, we encourage you to take your nausea medication as directed.  BELOW ARE SYMPTOMS THAT SHOULD BE REPORTED IMMEDIATELY: *FEVER GREATER THAN 100.4 F (38 C) OR HIGHER *CHILLS OR SWEATING *NAUSEA AND VOMITING THAT IS NOT CONTROLLED WITH YOUR NAUSEA MEDICATION *UNUSUAL SHORTNESS OF BREATH *UNUSUAL BRUISING OR BLEEDING *URINARY PROBLEMS (pain or burning when urinating, or frequent urination) *BOWEL PROBLEMS (unusual diarrhea, constipation, pain near the anus) TENDERNESS IN MOUTH AND THROAT WITH OR WITHOUT PRESENCE OF ULCERS (sore throat, sores in mouth, or a toothache) UNUSUAL RASH, SWELLING OR PAIN  UNUSUAL VAGINAL DISCHARGE OR ITCHING   Items with * indicate a potential emergency and should be followed up as soon as possible or go to the Emergency Department if any  problems should occur.  Please show the CHEMOTHERAPY ALERT CARD or IMMUNOTHERAPY ALERT CARD at check-in to the Emergency Department and triage nurse.  Should you have questions after your visit or need to cancel or reschedule your appointment, please contact [facility] [phone]  and follow the prompts.  Office hours are 8:00 a.m. to 4:30 p.m. Monday - Friday. Please note that voicemails left after 4:00 p.m. may not be returned until the following business day.  We are closed weekends and major holidays. You have access to a nurse at all times for urgent questions. Please call the main number to the clinic [phone] and follow the prompts.  For any non-urgent questions, you may also contact your provider using MyChart. We now offer e-Visits for anyone 34 and older to request care online for non-urgent symptoms. For details visit [email or web address].   Also download the MyChart app! Go to the app store, search "MyChart", open the app, select [facility], and log in with your MyChart username and password.  Due to Covid, a mask is required upon entering the hospital/clinic. If you do not have a mask, one will be given to you upon arrival. For doctor visits, patients may have 1 support person aged 27 or older with them. For treatment visits, patients cannot have anyone with them due to current Covid guidelines and our immunocompromised population.

## 2020-12-07 NOTE — Telephone Encounter (Signed)
  Care Management   Follow Up Note   12/07/2020 Name: Savannah Burns MRN: [id number] DOB: 12-25-1950   Referred by: [name], DO Reason for referral : Chronic Care Management (Unsuccessful outreach)   An unsuccessful telephone outreach was attempted today. The patient was referred to the case management team for assistance with care management and care coordination.   Follow Up Plan: A HIPPA compliant phone message was left for the patient providing contact information and requesting a return call.  Forwarding to [facility] Care Guides for outreach and rescheduling.  [name], BSN, RN-BC Embedded Chronic Care Manager [facility] / [facility] Management Direct Dial: [phone]

## 2020-12-07 NOTE — Progress Notes (Signed)
Patient arrived today for 1 unit of PRBC. Patient's oral temp 99.6 patient states she feels okay and does not complain of any other symptoms. Tarri Abernethy, PA made aware, okay to proceed with blood transfusion and advises patient to take temperature 3 times a day at home. Patient tolerated Blood infusion with no complaints voiced. Peripheral IV site clean and dry with good blood return noted before and after infusion. Band aid applied. VSS with discharge and left in satisfactory condition via wheelchair with Granddaughter, with no s/s of distress noted.

## 2020-12-08 LAB — TYPE AND SCREEN
ABO/RH(D): A POS
Antibody Screen: NEGATIVE
Unit division: 0

## 2020-12-08 LAB — BPAM RBC
Blood Product Expiration Date: 202210102359
ISSUE DATE / TIME: 202209121408
Unit Type and Rh: 6200

## 2020-12-08 LAB — PROTEIN ELECTROPHORESIS, SERUM
A/G Ratio: 0.6 — ABNORMAL LOW (ref 0.7–1.7)
Albumin ELP: 3 g/dL (ref 2.9–4.4)
Alpha-1-Globulin: 0.2 g/dL (ref 0.0–0.4)
Alpha-2-Globulin: 0.7 g/dL (ref 0.4–1.0)
Beta Globulin: 1 g/dL (ref 0.7–1.3)
Gamma Globulin: 2.7 g/dL — ABNORMAL HIGH (ref 0.4–1.8)
Globulin, Total: 4.7 g/dL — ABNORMAL HIGH (ref 2.2–3.9)
M-Spike, %: 2.4 g/dL — ABNORMAL HIGH
Total Protein ELP: 7.7 g/dL (ref 6.0–8.5)

## 2020-12-09 ENCOUNTER — Other Ambulatory Visit: Payer: Self-pay

## 2020-12-09 ENCOUNTER — Ambulatory Visit: Payer: Medicare PPO | Admitting: Family Medicine

## 2020-12-09 ENCOUNTER — Encounter: Payer: Self-pay | Admitting: Family Medicine

## 2020-12-09 VITALS — BP 115/56 | HR 96 | Temp 98.2°F | Ht 62.0 in | Wt 289.2 lb

## 2020-12-09 DIAGNOSIS — E785 Hyperlipidemia, unspecified: Secondary | ICD-10-CM

## 2020-12-09 DIAGNOSIS — E1169 Type 2 diabetes mellitus with other specified complication: Secondary | ICD-10-CM

## 2020-12-09 DIAGNOSIS — E1159 Type 2 diabetes mellitus with other circulatory complications: Secondary | ICD-10-CM | POA: Diagnosis not present

## 2020-12-09 DIAGNOSIS — F32A Depression, unspecified: Secondary | ICD-10-CM

## 2020-12-09 DIAGNOSIS — Z23 Encounter for immunization: Secondary | ICD-10-CM | POA: Diagnosis not present

## 2020-12-09 DIAGNOSIS — K219 Gastro-esophageal reflux disease without esophagitis: Secondary | ICD-10-CM

## 2020-12-09 DIAGNOSIS — I152 Hypertension secondary to endocrine disorders: Secondary | ICD-10-CM | POA: Diagnosis not present

## 2020-12-09 LAB — BAYER DCA HB A1C WAIVED: HB A1C (BAYER DCA - WAIVED): 5.2 % (ref 4.8–5.6)

## 2020-12-09 MED ORDER — ESOMEPRAZOLE MAGNESIUM 20 MG PO CPDR: 20.0000 mg | DELAYED_RELEASE_CAPSULE | Freq: Every day | ORAL | 3 refills | Status: DC

## 2020-12-09 NOTE — Patient Instructions (Signed)
Almyra Free will call for pharmacy appt Nicki Reaper will call for counseling STOP pantoprazole.  START Nexium

## 2020-12-09 NOTE — Progress Notes (Signed)
Subjective: CC: DM PCP: [name], DO [id number] Savannah Burns is a 70 y.o. female presenting to clinic today for:  1. Type 2 Diabetes with hypertension, hyperlipidemia:  Monitor blood sugars.  Has been taking metformin 1000 mg daily.  Compliant with antihypertensive and cholesterol medication.  Last eye exam: UTD Last foot exam: UTD Last A1c:  Lab Results  Component Value Date   HGBA1C 5.3 09/04/2020   Nephropathy screen indicated?: UTD Last flu, zoster and/or pneumovax:  Immunization History  Administered Date(s) Administered   Hepatitis B 05/30/1991, 06/27/1991, 12/04/1991   Influenza Whole 01/04/2008, 03/24/2009   Influenza, High Dose Seasonal PF 01/17/2018, 12/29/2018   Influenza,inj,Quad PF,6+ Mos 11/27/2014   Influenza-Unspecified 01/26/2013, 03/01/2014   Moderna Sars-Covid-2 Vaccination 06/12/2019   Pneumococcal Conjugate-13 03/01/2014   Pneumococcal Polysaccharide-23 09/08/2015   Td 05/27/2002   Tdap 09/04/2020   Zoster, Live 02/14/2013    ROS: No chest pain but has chronically decreased energy  2.  Iron deficiency anemia due to chronic blood loss/ depressive disorder Patient voices quite a bit of frustration and is quite tearful today because "they cannot figure out withdrawal with me" and she has been getting transfusions fairly regularly.  She does feel like she would be better off dead sometimes.  She is currently receiving counseling services from [facility].  She is on Lexapro 10 mg.  ROS: Per HPI  Allergies  Allergen Reactions   Keflex [Cephalexin] Nausea And Vomiting   Past Medical History:  Diagnosis Date   Adrenal adenoma, left    Stable   Anxiety    Arthritis    bilateral hands   Depression    Diabetes mellitus, type 2 ([place]) 08/12/2008   Qualifier: Diagnosis of  By: Deborra Medina MD, Talia     Dyspnea    Esophageal varices (HCC)    Grade II diastolic dysfunction    History of kidney stones    Hyperlipidemia    Hypertension    Lower back pain     Lower GI bleed 03/19/2020   Panic attacks    Pneumonia    currently taking antibiotic and prednisone for early stages of pneumonia   Pulmonary nodules    bilateral   Skin cancer    face    Current Outpatient Medications:    Blood Glucose Calibration (GLUCOMETER DEX HIGH CONTROL) LIQD, Test CBG's once daily. , Disp: , Rfl:    Blood Glucose Monitoring Suppl (TRUE METRIX AIR GLUCOSE METER) w/Device KIT, Check BS daily Dx E11.9, Disp: 1 kit, Rfl: 0   Cholecalciferol (VITAMIN D) 50 MCG (2000 UT) tablet, Take 4,000 Units by mouth daily., Disp: , Rfl:    erythromycin ophthalmic ointment, Place 1 application into the left eye at bedtime., Disp: 3.5 g, Rfl: 1   escitalopram (LEXAPRO) 10 MG tablet, Take 1 tablet (10 mg total) by mouth daily., Disp: 90 tablet, Rfl: 3   furosemide (LASIX) 20 MG tablet, Take 20 mg by mouth. bid, Disp: , Rfl:    glucose blood (TRUE METRIX BLOOD GLUCOSE TEST) test strip, Check BS daily Dx E11.9, Disp: 100 each, Rfl: 3   lisinopril (ZESTRIL) 10 MG tablet, Take 1 tablet by mouth once daily (Patient taking differently: Take 10 mg by mouth daily.), Disp: 90 tablet, Rfl: 0   lovastatin (MEVACOR) 20 MG tablet, Take 1 tablet (20 mg total) by mouth at bedtime., Disp: 90 tablet, Rfl: 3   metFORMIN (GLUCOPHAGE) 1000 MG tablet, Take 1 tablet (1,000 mg total) by mouth daily with breakfast., Disp:  90 tablet, Rfl: 3   nystatin cream (MYCOSTATIN), Apply 1 application topically 2 (two) times daily. X14 days or until rash resolved, Disp: 60 g, Rfl: 1   oxyCODONE-acetaminophen (PERCOCET) 10-325 MG tablet, Take 1 tablet by mouth every 6 (six) hours as needed for pain., Disp: , Rfl:    OXYGEN, Inhale 3 L into the lungs continuous., Disp: , Rfl:    pantoprazole (PROTONIX) 40 MG tablet, Take 1 tablet (40 mg total) by mouth daily., Disp: 30 tablet, Rfl: 5   potassium chloride SA (KLOR-CON) 20 MEQ tablet, Take 2 tablets (40 mEq total) by mouth 2 (two) times daily. (Patient taking differently:  Take 40 mEq by mouth daily.), Disp: 360 tablet, Rfl: 3   TRUEplus Lancets 30G MISC, Check BS daily Dx E11.9, Disp: 100 each, Rfl: 3   vitamin B-12 (CYANOCOBALAMIN) 1000 MCG tablet, Take 2,000 mcg by mouth daily., Disp: , Rfl:    ferrous sulfate 325 (65 FE) MG tablet, Take 1 tablet (325 mg total) by mouth 2 (two) times daily with a meal. (Patient taking differently: Take 325 mg by mouth daily with breakfast.), Disp: 60 tablet, Rfl: 1   nadolol (CORGARD) 20 MG tablet, Take 1 tablet (20 mg total) by mouth daily. (Patient not taking: Reported on 12/09/2020), Disp: 30 tablet, Rfl: 3   torsemide (DEMADEX) 20 MG tablet, Take 3 tablets (60 mg total) by mouth 2 (two) times daily. (Patient taking differently: Take 50 mg by mouth 2 (two) times daily.), Disp: 180 tablet, Rfl: 11 Social History   Socioeconomic History   Marital status: Widowed    Spouse name: Not on file   Number of children: 2   Years of education: 14   Highest education level: Not on file  Occupational History   Occupation: Retired   Tobacco Use   Smoking status: Former    Packs/day: 1.50    Years: 40.00    Pack years: 60.00    Types: Cigarettes    Quit date: 04/29/2015    Years since quitting: 5.6   Smokeless tobacco: Never   Tobacco comments:    Quit smoking 04/2015- Previous 1.5 ppd smoker  Vaping Use   Vaping Use: Never used  Substance and Sexual Activity   Alcohol use: No    Alcohol/week: 0.0 standard drinks   Drug use: No   Sexual activity: Not Currently    Birth control/protection: Post-menopausal  Other Topics Concern   Not on file  Social History Narrative   Her 9 year old granddaughter lives with her - one daughter lives nearby, but she doesn't have a good relationship with her. Has a great relationship with other daughter who lives 1.5 hrs away - talks to her daily on the phone.   Social Determinants of Health   Financial Resource Strain: Low Risk    Difficulty of Paying Living Expenses: Not very hard  Food  Insecurity: No Food Insecurity   Worried About Charity fundraiser in the Last Year: Never true   Ran Out of Food in the Last Year: Never true  Transportation Needs: No Transportation Needs   Lack of Transportation (Medical): No   Lack of Transportation (Non-Medical): No  Physical Activity: Inactive   Days of Exercise per Week: 0 days   Minutes of Exercise per Session: 0 min  Stress: Stress Concern Present   Feeling of Stress : To some extent  Social Connections: Socially Isolated   Frequency of Communication with Friends and Family: More than three times a  week   Frequency of Social Gatherings with Friends and Family: Once a week   Attends Religious Services: Never   Marine scientist or Organizations: No   Attends Archivist Meetings: Never   Marital Status: Widowed  Human resources officer Violence: Not At Risk   Fear of Current or Ex-Partner: No   Emotionally Abused: No   Physically Abused: No   Sexually Abused: No   Family History  Problem Relation Age of Onset   Diabetes Father    Heart disease Father 4       MI   Hypertension Father    Anemia Mother        Transfusion dependent   COPD Sister    Cancer Paternal Grandmother 60       Pancreatic    Objective: Office vital signs reviewed. BP (!) 115/56   Pulse 96   Temp 98.2 F (36.8 C)   Ht 5' 2" (1.575 m)   Wt 289 lb 3.2 oz (131.2 kg)   SpO2 93%   BMI 52.90 kg/m   Physical Examination:  General: Awake, alert, nontoxic, morbidly obese. No acute distress Cardio: regular rate and rhythm, S1S2 heard, no murmurs appreciated Pulm: clear to auscultation bilaterally, no wheezes, rhonchi or rales; normal work of breathing on room air MSK: arrives in wheelchair Psych: emotionally labile, tearful.  Depression screen [facility] 2/9 12/09/2020 12/03/2020 10/26/2020  Decreased Interest _0 Down, Depressed, Hopeless _1 PHQ - 2 Score _2 Altered sleeping _3 Tired, decreased energy _4 Change in appetite  0 0 0  Feeling bad or failure about yourself  1 0 0  Trouble concentrating _5 Moving slowly or fidgety/restless 0 2 2  Suicidal thoughts 0 0 0  PHQ-9 Score _6 Difficult doing work/chores Somewhat difficult Somewhat difficult Somewhat difficult  Some recent data might be hidden   GAD 7 : Generalized Anxiety Score 12/09/2020 12/05/2019 12/22/2017  Nervous, Anxious, on Edge 0 1 1  Control/stop worrying _7 Worry too much - different things _8 Trouble relaxing 0 0 1  Restless 0 0 0  Easily annoyed or irritable _9 Afraid - awful might happen 1 0 3  Total GAD 7 Score _10 Anxiety Difficulty Somewhat difficult Somewhat difficult Somewhat difficult    Assessment/ Plan: 70 y.o. female   Type 2 diabetes mellitus with other specified complication, without long-term current use of insulin (HCC) - Plan: Bayer DCA Hb A1c Waived, AMB Referral to [facility] Coordinaton, CANCELED: Hemoglobin, fingerstick, CANCELED: AMB Referral to [place]  Hypertension associated with diabetes ([place])  Hyperlipidemia associated with type 2 diabetes mellitus ([place])  Gastroesophageal reflux disease without esophagitis - Plan: esomeprazole (NEXIUM) 20 MG capsule  Need for immunization against influenza - Plan: Flu Vaccine QUAD High Dose(Fluad), CANCELED: Flu Vaccine QUAD High Dose(Fluad)  Uncontrolled depression  Since she has been getting transfusions there is really no great way for [place] to interpret her A1c.  I certainly think this is skewed.  She is currently on metformin 1000 mg daily.  We discussed consideration for Ozempic or Trulicity and she seems very motivated to start something similar to [facility].  We discussed that her insurance would be prohibitive of this at this time but I think a GLP class may be of benefit to her, particularly with weight.  I would  like her to see Almyra Free in the next few weeks to discuss these options and perhaps get patient assistance with one of  them.  I certainly think that she may benefit from a CGM as well.  She does not monitor her blood sugars at home and I think that her mental health likely impacts her motivation to do so.  However, I suspect that this is good to be the only way that we can really determine what her blood sugars are looking like and how we might need to adjust her medications.  I have reached out to endocrinology for further recommendations about this complex patient as well  Blood pressure is controlled.  No changes  Continue statin  PPI switched to Nexium, which she reported worked better for her  Influenza vaccination administered  I have asked that [facility] reach out to her for ongoing counseling services.  We could consider increasing her Lexapro dose versus adding Wellbutrin if she desired.  Orders Placed This Encounter  Procedures   Bayer DCA Hb A1c Waived   No orders of the defined types were placed in this encounter.    [name], DO [place] [phone]

## 2020-12-10 ENCOUNTER — Telehealth: Payer: Self-pay

## 2020-12-10 ENCOUNTER — Telehealth: Payer: Self-pay | Admitting: *Deleted

## 2020-12-10 NOTE — Progress Notes (Signed)
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 Pugmire 70 y.o. female returns for routine follow-up of her iron deficiency anemia and smoldering myeloma.  She was last seen by Tarri Abernethy PA-C on 09/18/2020.  At today's visit, she reports feeling fair.  She did require a recent blood transfusion and iron infusions.  1 unit PRBC transfused on 12/07/2020 due to Hgb 7.3 on 12/04/2020 (was 9.4 on 11/13/2020).  She reports onset of dark stools about 2 weeks ago, reports that she started to feel poorly after 1 week of dark stools.  She reports symptoms of fatigue, lightheadedness, and worsening dyspnea on exertion.  Although she admits to melena, she denies any bruising, petechial rash, hematochezia, epistaxis, or other sources of blood loss.  No chest pain or syncopal episodes.  She denies any B symptoms such as fever, chills, night sweats, unintentional weight loss. No new bone pain or fractures.  No new onset paresthesias or neuropathy.  No new neurologic deficits.  She denies any frequent infections.   She has little to no energy and 100% appetite. She endorses that she is maintaining a stable weight.    REVIEW OF SYSTEMS:  Review of Systems  Constitutional:  Positive for fatigue. Negative for appetite change, chills, diaphoresis, fever and unexpected weight change.  HENT:   Negative for lump/mass and nosebleeds.   Eyes:  Negative for eye problems.  Respiratory:  Positive for shortness of breath. Negative for cough and hemoptysis.   Cardiovascular:  Positive for leg swelling. Negative for chest pain and  palpitations.  Gastrointestinal:  Positive for blood in stool. Negative for abdominal pain, constipation, diarrhea, nausea and vomiting.  Genitourinary:  Negative for hematuria.   Skin: Negative.   Neurological:  Positive for light-headedness. Negative for dizziness and headaches.  Hematological:  Does not bruise/bleed easily.  Psychiatric/Behavioral:  Positive for depression.      PAST MEDICAL/SURGICAL HISTORY:  Past Medical History:  Diagnosis Date   Adrenal adenoma, left    Stable   Anxiety    Arthritis    bilateral hands   Depression    Diabetes mellitus, type 2 ([facility]) 08/12/2008   Qualifier: Diagnosis of  By: Deborra Medina MD, Talia     Dyspnea    Esophageal varices (HCC)    Grade II diastolic dysfunction    History of kidney stones    Hyperlipidemia    Hypertension    Lower back pain    Lower GI bleed 03/19/2020   Panic attacks    Pneumonia    currently taking antibiotic and prednisone for early stages of pneumonia   Pulmonary nodules    bilateral   Skin cancer    face   Past Surgical History:  Procedure Laterality Date   BIOPSY  04/07/2020   Procedure: BIOPSY;  Surgeon: Eloise Harman, DO;  [facility];  Service: Endoscopy;;   Breast Cystectomy  Right    CESAREAN SECTION     COLONOSCOPY WITH PROPOFOL N/A 01/25/2020   Dr. Abbey Chatters: Nonbleeding internal hemorrhoids, diverticulosis, 5 mm polyp removed from the ascending colon, 10 mm polyp removed from the sigmoid colon,  30 mm polyp (tubulovillous adenoma with no high-grade dysplasia) removed from the transverse colon via piecemeal status post tattoo.  Other polyps were tubular adenomas.  3 month surveillance colonoscopy recommended.   COLONOSCOPY WITH PROPOFOL N/A 04/07/2020   Procedure: COLONOSCOPY WITH PROPOFOL;  Surgeon: Eloise Harman, DO;  [facility];  Service: Endoscopy;  Laterality: N/A;  3:00pm, pt knows new time per office   CYSTOSCOPY/URETEROSCOPY/HOLMIUM LASER/STENT PLACEMENT Bilateral  03/01/2019   Procedure: CYSTOSCOPY/RETROGRADEURETEROSCOPY/HOLMIUM LASER/STENT PLACEMENT;  Surgeon: Ceasar Mons, MD;  Location: [facility];  Service: Urology;  Laterality: Bilateral;  ONLY NEEDS 60 MIN   ESOPHAGOGASTRODUODENOSCOPY (EGD) WITH PROPOFOL N/A 01/25/2020   Dr. Abbey Chatters: 4 columns grade 1 esophageal varices   ESOPHAGOGASTRODUODENOSCOPY (EGD) WITH PROPOFOL N/A 05/18/2020   Procedure: ESOPHAGOGASTRODUODENOSCOPY (EGD) WITH PROPOFOL;  Surgeon: Eloise Harman, DO;  [facility];  Service: Endoscopy;  Laterality: N/A;   ESOPHAGOGASTRODUODENOSCOPY (EGD) WITH PROPOFOL N/A 07/28/2020   Procedure: ESOPHAGOGASTRODUODENOSCOPY (EGD) WITH PROPOFOL;  Surgeon: Eloise Harman, DO;  [facility];  Service: Endoscopy;  Laterality: N/A;  am or early PM due to givens capsule placement   GIVENS CAPSULE STUDY N/A 05/18/2020   Procedure: [place];  Surgeon: Harvel Quale, MD;  [facility];  Service: Gastroenterology;  Laterality: N/A;   GIVENS CAPSULE STUDY N/A 07/28/2020   Procedure: GIVENS CAPSULE STUDY;  Surgeon: Eloise Harman, DO;  [facility];  Service: Endoscopy;  Laterality: N/A;   POLYPECTOMY  01/25/2020   Procedure: POLYPECTOMY;  Surgeon: Eloise Harman, DO;  [facility];  Service: Endoscopy;;   POLYPECTOMY  04/07/2020   Procedure: POLYPECTOMY INTESTINAL;  Surgeon: Eloise Harman, DO;  [facility];  Service: Endoscopy;;   SKIN CANCER EXCISION     Face   SPINE SURGERY     SUBMUCOSAL TATTOO INJECTION  01/25/2020   Procedure: SUBMUCOSAL TATTOO INJECTION;  Surgeon: Eloise Harman, DO;  [facility];  Service: Endoscopy;;     SOCIAL HISTORY:  Social History   Socioeconomic History   Marital status: Widowed    Spouse name: Not on file   Number of children: 2   Years of education: 14   Highest education level: Not on file  Occupational History   Occupation: Retired    Tobacco Use   Smoking status: Former    Packs/day: 1.50    Years: 40.00    Pack years: 60.00    Types: Cigarettes    Quit date: 04/29/2015    Years since quitting: 5.6   Smokeless tobacco: Never   Tobacco comments:    Quit smoking 04/2015- Previous 1.5 ppd smoker  Vaping Use   Vaping Use: Never used  Substance and Sexual Activity   Alcohol use: No    Alcohol/week: 0.0 standard drinks   Drug use: No   Sexual activity: Not Currently    Birth control/protection: Post-menopausal  Other Topics Concern   Not on file  Social History Narrative   Her 28 year old granddaughter lives with her - one daughter lives nearby, but she doesn't have a good relationship with her. Has a great relationship with other daughter who lives 1.5 hrs away - talks to her daily on the phone.   Social Determinants of Health   Financial Resource Strain: Low Risk    Difficulty of Paying Living Expenses: Not very hard  Food Insecurity: No Food Insecurity   Worried About Charity fundraiser  in the Last Year: Never true   [place] in the Last Year: Never true  Transportation Needs: No Transportation Needs   Lack of Transportation (Medical): No   Lack of Transportation (Non-Medical): No  Physical Activity: Inactive   Days of Exercise per Week: 0 days   Minutes of Exercise per Session: 0 min  Stress: Stress Concern Present   Feeling of Stress : To some extent  Social Connections: Socially Isolated   Frequency of Communication with Friends and Family: More than three times a week   Frequency of Social Gatherings with Friends and Family: Once a week   Attends Religious Services: Never   Marine scientist or Organizations: No   Attends Archivist Meetings: Never   Marital Status: Widowed  Human resources officer Violence: Not At Risk   Fear of Current or Ex-Partner: No   Emotionally Abused: No   Physically Abused: No   Sexually Abused: No    FAMILY HISTORY:  Family History  Problem  Relation Age of Onset   Diabetes Father    Heart disease Father 42       MI   Hypertension Father    Anemia Mother        Transfusion dependent   COPD Sister    Cancer Paternal Grandmother 85       Pancreatic    CURRENT MEDICATIONS:  Outpatient Encounter Medications as of 12/11/2020  Medication Sig Note   Blood Glucose Calibration (GLUCOMETER DEX HIGH CONTROL) LIQD Test CBG's once daily.     Blood Glucose Monitoring Suppl (TRUE METRIX AIR GLUCOSE METER) w/Device KIT Check BS daily Dx E11.9    Cholecalciferol (VITAMIN D) 50 MCG (2000 UT) tablet Take 4,000 Units by mouth daily.    erythromycin ophthalmic ointment Place 1 application into the left eye at bedtime.    escitalopram (LEXAPRO) 10 MG tablet Take 1 tablet (10 mg total) by mouth daily.    esomeprazole (NEXIUM) 20 MG capsule Take 1 capsule (20 mg total) by mouth daily at 12 noon.    ferrous sulfate 325 (65 FE) MG tablet Take 1 tablet (325 mg total) by mouth 2 (two) times daily with a meal. (Patient taking differently: Take 325 mg by mouth daily with breakfast.)    furosemide (LASIX) 20 MG tablet Take 20 mg by mouth. bid    glucose blood (TRUE METRIX BLOOD GLUCOSE TEST) test strip Check BS daily Dx E11.9    lisinopril (ZESTRIL) 10 MG tablet Take 1 tablet by mouth once daily (Patient taking differently: Take 10 mg by mouth daily.)    lovastatin (MEVACOR) 20 MG tablet Take 1 tablet (20 mg total) by mouth at bedtime.    metFORMIN (GLUCOPHAGE) 1000 MG tablet Take 1 tablet (1,000 mg total) by mouth daily with breakfast.    nadolol (CORGARD) 20 MG tablet Take 1 tablet (20 mg total) by mouth daily. (Patient not taking: Reported on 12/09/2020) 08/04/2020: No longer taking   nystatin cream (MYCOSTATIN) Apply 1 application topically 2 (two) times daily. X14 days or until rash resolved    oxyCODONE-acetaminophen (PERCOCET) 10-325 MG tablet Take 1 tablet by mouth every 6 (six) hours as needed for pain.    OXYGEN Inhale 3 L into the lungs  continuous.    potassium chloride SA (KLOR-CON) 20 MEQ tablet Take 2 tablets (40 mEq total) by mouth 2 (two) times daily. (Patient taking differently: Take 40 mEq by mouth daily.)    torsemide (DEMADEX) 20 MG tablet Take  3 tablets (60 mg total) by mouth 2 (two) times daily. (Patient taking differently: Take 50 mg by mouth 2 (two) times daily.)    TRUEplus Lancets 30G MISC Check BS daily Dx E11.9    vitamin B-12 (CYANOCOBALAMIN) 1000 MCG tablet Take 2,000 mcg by mouth daily.    [DISCONTINUED] albuterol (PROVENTIL HFA;VENTOLIN HFA) 108 (90 Base) MCG/ACT inhaler Inhale 2 puffs into the lungs every 6 (six) hours as needed for wheezing or shortness of breath.    No facility-administered encounter medications on file as of 12/11/2020.    ALLERGIES:  Allergies  Allergen Reactions   Keflex [Cephalexin] Nausea And Vomiting     PHYSICAL EXAM:  ECOG PERFORMANCE STATUS: 2 - Symptomatic, <50% confined to bed  Physical Exam Constitutional:      Appearance: Normal appearance. She is obese.  HENT:     Head: Normocephalic and atraumatic.     Mouth/Throat:     Mouth: Mucous membranes are moist.  Eyes: Conjunctival pallor     Extraocular Movements: Extraocular movements intact.     Pupils: Pupils are equal, round, and reactive to light.  Cardiovascular:     Rate and Rhythm: Normal rate and regular rhythm.     Pulses: Normal pulses.     Heart sounds: Murmur heard.  Pulmonary:     Effort: Pulmonary effort is normal.     Breath sounds: Normal breath sounds.  Abdominal:     General: Bowel sounds are normal.     Palpations: Abdomen is soft.     Tenderness: There is no abdominal tenderness.  Musculoskeletal:        General: No swelling.     Right lower leg: Edema (4+ pretibial pitting edema) present.     Left lower leg: Edema (4+ pretibial pitting edema) present.  Lymphadenopathy:     Cervical: No cervical adenopathy.  Skin:    General: Pale. Skin is warm and dry.  Neurological:     General:  No focal deficit present.     Mental Status: She is alert and oriented to person, place, and time.  Psychiatric:        Mood and Affect: Mood normal.        Behavior: Behavior normal.     LABORATORY DATA:  I have reviewed the labs as listed.  CBC    Component Value Date/Time   WBC 4.9 12/04/2020 1120   RBC 2.63 (L) 12/04/2020 1120   HGB 7.3 (L) 12/04/2020 1120   HGB 7.8 (LL) 06/24/2020 1529   HCT 25.2 (L) 12/04/2020 1120   HCT 26.6 (L) 06/24/2020 1529   PLT 92 (L) 12/04/2020 1120   PLT 149 (L) 06/24/2020 1529   MCV 95.8 12/04/2020 1120   MCV 84 06/24/2020 1529   MCH 27.8 12/04/2020 1120   MCHC 29.0 (L) 12/04/2020 1120   RDW 18.2 (H) 12/04/2020 1120   RDW 18.4 (H) 06/24/2020 1529   LYMPHSABS 1.1 12/04/2020 1120   LYMPHSABS 1.8 06/24/2020 1529   MONOABS 0.5 12/04/2020 1120   EOSABS 0.0 12/04/2020 1120   EOSABS 0.1 06/24/2020 1529   BASOSABS 0.0 12/04/2020 1120   BASOSABS 0.0 06/24/2020 1529   CMP Latest Ref Rng & Units 12/04/2020 11/13/2020 09/11/2020  Glucose 70 - 99 mg/dL 172(H) 207(H) 132(H)  BUN 8 - 23 mg/dL _0 Creatinine 0.44 - 1.00 mg/dL 0.63 0.67 0.59  Sodium 135 - 145 mmol/L 137 137 136  Potassium 3.5 - 5.1 mmol/L 3.8 3.6 3.7  Chloride 98 - 111  mmol/L 103 101 103  CO2 22 - 32 mmol/L _0 Calcium 8.9 - 10.3 mg/dL 8.3(L) 8.9 8.5(L)  Total Protein 6.5 - 8.1 g/dL 7.7 8.0 8.0  Total Bilirubin 0.3 - 1.2 mg/dL 0.4 0.2(L) 0.2(L)  Alkaline Phos 38 - 126 U/L 86 99 91  AST 15 - 41 U/L _1 ALT 0 - 44 U/L _2 DIAGNOSTIC IMAGING:  I have independently reviewed the relevant imaging and discussed with the patient.  ASSESSMENT & PLAN: 1.  Iron deficiency anemia in the setting of GI blood loss - She has had three hospital admissions for GI bleeding in the past year (October 2021, December 2021, February 2022) - Hgb was as low as 3.7 (01/22/2020), and she has had 9 blood transfusions. - Most recently had 1 unit PRBC transfused on 12/07/2020 due to Hgb  7.3 on 12/04/2020 (was 9.4 on 11/13/2020). - Before the GI bleeding started in October 2021, her hemoglobin was within normal range. - Extensive endoscopic work-up, but source of chronic anemia has yet to be identified.   Last EGD (07/28/2020) showed grade 1 esophageal varices and gastritis, as well as prominent blood vessels at the base of the tongue and underneath the tongue (question lingual varicosities) Capsule endoscopy (07/28/2020) did not show any obvious lesions, erosions, or overt GI bleeding Last colonoscopy (January 2022) showed nonbleeding internal hemorrhoids, diverticulosis in the sigmoid and descending colon, and polypectomy. - She has been referred to ENT to investigate potential lingual varicosities as the source of bleed  - Patient was started on oral ferrous sulfate by her PCP, but with little improvement, suspect poor absorption of iron as she is also on pantoprazole daily - She has received several courses of IV iron, most recently on 10/16/2020 - Most recent labs (12/04/2020) with decreased hemoglobin 7.3 (down from Hgb 9.4 three weeks prior).  Ferritin 18 with iron saturation 6% - Repeat CBC (12/11/2020) shows Hgb 7.0, following PRBC transfusion on 12/07/2020. - She reports onset of dark stools about 2 weeks ago.  She is symptomatic with worsening fatigue and dyspnea on exertion. - PLAN: We will transfuse PRBC x1 today.  Repeat CBC on Monday & Friday next week, and then weekly on Fridays x4 weeks.  Phone visit in 4 weeks. - Proceed with IV Venofer x1000 mg. - We will plan to repeat iron panel in November/December - I have discussed with her gastroenterologist, Dr. Abbey Chatters, who will schedule her for EGD/colonoscopy next week due to current bleeding episode. - Patient is aware of alarm symptoms that would prompt immediate medical attention at the emergency department.  2.  Smoldering myeloma, IgG kappa - Initially diagnosed during work-up for anemia in April 2022. - Immunofixation shows  monoclonal IgG kappa, initial M spike 2.6 (07/09/2020), initial light chain ratio 4.17 (07/09/2020) - Skeletal survey (07/16/2020):  No lytic lesions - PET scan (08/04/2020): negative for any focal hypermetabolic activity in the skeleton, but did show mild diffuse marrow activity which is likely anemia marrow response after being given IV iron; no soft tissue plasmacytoma; no lytic lesions on CT portion of exam - Bone marrow biopsy (08/04/2020): Plasma cells 18%; hypercellular bone marrow with trilineage hematopoiesis, no significant dyspoiesis or increase in blastic cells - FISH panel for plasma cell disorders showed no evidence of abnormalities - Cytogenics unable to be completed, as cultures from submitted sample failed to yield any metaphase cells - No CRAB features: Since bone marrow biopsy showed adequate erythropoiesis,  patient's persistent anemia is due to blood loss as addressed above.  Calcium and creatinine within normal limits.  Most recent CMP (12/04/2020) with calcium 8.3 and creatinine 0.63.  Most recent hemoglobin (12/11/2020) 7.0, but related to GI blood loss as above. - Per Mayo 20/20/2 Score, patient is intermediate risk (1/3 criteria met); IMWG Score of 5 (low-intermediate risk) - NO TREATMENT INDICATED AT THIS TIME - Most recent labs (12/04/2020): Stable M spike 2.4.  Free light chain ratio 4.16 (increased from last measurement 3 months ago, but similar to measurement from April 2022), kappa 41.9, lambda 17.3. - No new bone pain or B symptoms. - PLAN: Myeloma panel is stable, will repeat in 3 months (December 2022).  RTC in 3 months for follow-up and repeat labs.  Next skeletal survey is due in April 2023.  3.  Mild thrombocytopenia and leukopenia, secondary to cirrhosis - Most recent CBC (12/11/2020) with normal WBC 5.1, platelets at baseline at 87 - B12 deficiency has been treated as below;  folate and copper normal - Suspect thrombocytopenia and leukopenia secondary to underlying  cirrhosis - Admits to easy bruising.  Denies any signs of petechial rash.  No recurrent infections - PLAN: Continue to monitor CBCs.  No active intervention needed at this time.   4.  Vitamin B12 deficiency - B12 remained low with elevated methylmalonic acid despite oral supplementation; patient was started on monthly B12 injections - Most recent B12 (12/04/2020) improved at 514 with normal methylmalonic acid - PLAN: Continue monthly B12 injections.  STOP oral B12 supplementation at home.  Recheck B12/methylmalonic acid in 6 months (March 2023).   5.  Congestive heart failure  - Mild fluid overload noted - patient reports that she did not take her Lasix for the past several days - PLAN: Educated patient on compliance with diuretics, informed of alarm symptoms that would warrant presentation to ED.   PLAN SUMMARY & DISPOSITION: - Labs with sample to blood bank on Mon 9/16 (after 3 PM) - Labs with sample to blood bank on Friday 9/23 at 9:30 AM, repeated every Friday x4 weeks (through Friday 10/14) - Phone visit on Friday 10/14 in the afternoon - EGD / colonoscopy w/ Dr. Abbey Chatters next week - Make appt. w/[facility] for lingual varicosities - Monthly B12 injections  All questions were answered. The patient knows to call the clinic with any problems, questions or concerns.  Medical decision making: Moderate  Time spent on visit: I spent 30 minutes counseling the patient face to face. The total time spent in the appointment was 40 minutes and more than 50% was on counseling.   Harriett Rush, PA-C  12/11/2020 2:53 PM

## 2020-12-10 NOTE — Telephone Encounter (Signed)
Pt called and aware

## 2020-12-10 NOTE — Telephone Encounter (Signed)
-----   Message from [name], DO sent at 12/10/2020  2:44 PM EDT ----- Disabled and needing assistance with lawn. Still think asking church might be a good idea but if she wants to hire someone: Savannah Burns, lives in [place], [phone]

## 2020-12-10 NOTE — Chronic Care Management (AMB) (Signed)
  Chronic Care Management   Note  12/10/2020 Name: Savannah Burns MRN: [id number] DOB: 1950/03/29  Savannah Burns is a 70 y.o. year old female who is a primary care patient of [name], DO. Savannah Burns is currently enrolled in care management services. An additional referral for Pharm D  was placed.   Follow up plan: Telephone appointment with care management team member scheduled for:01/01/2021  Noreene Larsson, [place], [place], [place] [address] Direct Dial: [phone] [email or web address]

## 2020-12-11 ENCOUNTER — Inpatient Hospital Stay (HOSPITAL_BASED_OUTPATIENT_CLINIC_OR_DEPARTMENT_OTHER): Payer: Medicare PPO | Admitting: Physician Assistant

## 2020-12-11 ENCOUNTER — Ambulatory Visit (HOSPITAL_COMMUNITY): Payer: Medicare PPO

## 2020-12-11 ENCOUNTER — Inpatient Hospital Stay (HOSPITAL_COMMUNITY): Payer: Medicare PPO

## 2020-12-11 ENCOUNTER — Other Ambulatory Visit: Payer: Self-pay

## 2020-12-11 ENCOUNTER — Other Ambulatory Visit (HOSPITAL_COMMUNITY): Payer: Medicare PPO

## 2020-12-11 VITALS — BP 121/79 | HR 86 | Temp 98.8°F | Resp 16

## 2020-12-11 DIAGNOSIS — Z85828 Personal history of other malignant neoplasm of skin: Secondary | ICD-10-CM | POA: Diagnosis not present

## 2020-12-11 DIAGNOSIS — D509 Iron deficiency anemia, unspecified: Secondary | ICD-10-CM | POA: Diagnosis not present

## 2020-12-11 DIAGNOSIS — C9000 Multiple myeloma not having achieved remission: Secondary | ICD-10-CM | POA: Diagnosis not present

## 2020-12-11 DIAGNOSIS — D3502 Benign neoplasm of left adrenal gland: Secondary | ICD-10-CM | POA: Diagnosis not present

## 2020-12-11 DIAGNOSIS — D472 Monoclonal gammopathy: Secondary | ICD-10-CM

## 2020-12-11 DIAGNOSIS — D649 Anemia, unspecified: Secondary | ICD-10-CM

## 2020-12-11 DIAGNOSIS — E538 Deficiency of other specified B group vitamins: Secondary | ICD-10-CM

## 2020-12-11 DIAGNOSIS — E119 Type 2 diabetes mellitus without complications: Secondary | ICD-10-CM | POA: Diagnosis not present

## 2020-12-11 DIAGNOSIS — D500 Iron deficiency anemia secondary to blood loss (chronic): Secondary | ICD-10-CM

## 2020-12-11 DIAGNOSIS — I10 Essential (primary) hypertension: Secondary | ICD-10-CM | POA: Diagnosis not present

## 2020-12-11 DIAGNOSIS — D696 Thrombocytopenia, unspecified: Secondary | ICD-10-CM

## 2020-12-11 DIAGNOSIS — E785 Hyperlipidemia, unspecified: Secondary | ICD-10-CM | POA: Diagnosis not present

## 2020-12-11 LAB — CBC WITH DIFFERENTIAL/PLATELET
Abs Immature Granulocytes: 0.01 10*3/uL (ref 0.00–0.07)
Basophils Absolute: 0 10*3/uL (ref 0.0–0.1)
Basophils Relative: 0 %
Eosinophils Absolute: 0.1 10*3/uL (ref 0.0–0.5)
Eosinophils Relative: 1 %
HCT: 24.1 % — ABNORMAL LOW (ref 36.0–46.0)
Hemoglobin: 7 g/dL — ABNORMAL LOW (ref 12.0–15.0)
Immature Granulocytes: 0 %
Lymphocytes Relative: 26 %
Lymphs Abs: 1.3 10*3/uL (ref 0.7–4.0)
MCH: 27 pg (ref 26.0–34.0)
MCHC: 29 g/dL — ABNORMAL LOW (ref 30.0–36.0)
MCV: 93.1 fL (ref 80.0–100.0)
Monocytes Absolute: 0.5 10*3/uL (ref 0.1–1.0)
Monocytes Relative: 10 %
Neutro Abs: 3.2 10*3/uL (ref 1.7–7.7)
Neutrophils Relative %: 63 %
Platelets: 87 10*3/uL — ABNORMAL LOW (ref 150–400)
RBC: 2.59 MIL/uL — ABNORMAL LOW (ref 3.87–5.11)
RDW: 16.8 % — ABNORMAL HIGH (ref 11.5–15.5)
WBC: 5.1 10*3/uL (ref 4.0–10.5)
nRBC: 0 % (ref 0.0–0.2)

## 2020-12-11 LAB — SAMPLE TO BLOOD BANK

## 2020-12-11 LAB — PREPARE RBC (CROSSMATCH)

## 2020-12-11 MED ORDER — DIPHENHYDRAMINE HCL 25 MG PO CAPS
25.0000 mg | ORAL_CAPSULE | Freq: Once | ORAL | Status: AC
  Administered 2020-12-11: 25 mg via ORAL
  Filled 2020-12-11: qty 1

## 2020-12-11 MED ORDER — ACETAMINOPHEN 325 MG PO TABS
650.0000 mg | ORAL_TABLET | Freq: Once | ORAL | Status: AC
  Administered 2020-12-11: 650 mg via ORAL

## 2020-12-11 MED ORDER — SODIUM CHLORIDE 0.9 % IV SOLN: Freq: Once | INTRAVENOUS | Status: AC

## 2020-12-11 MED ORDER — SODIUM CHLORIDE 0.9 % IV SOLN
300.0000 mg | Freq: Once | INTRAVENOUS | Status: AC
  Administered 2020-12-11: 300 mg via INTRAVENOUS
  Filled 2020-12-11: qty 300

## 2020-12-11 MED ORDER — CYANOCOBALAMIN 1000 MCG/ML IJ SOLN
1000.0000 ug | Freq: Once | INTRAMUSCULAR | Status: AC
  Administered 2020-12-11: 1000 ug via INTRAMUSCULAR
  Filled 2020-12-11: qty 1

## 2020-12-11 MED ORDER — ACETAMINOPHEN 325 MG PO TABS
650.0000 mg | ORAL_TABLET | Freq: Once | ORAL | Status: AC
  Filled 2020-12-11: qty 2

## 2020-12-11 MED ORDER — SODIUM CHLORIDE 0.9% IV SOLUTION
250.0000 mL | Freq: Once | INTRAVENOUS | Status: AC
  Administered 2020-12-11: 250 mL via INTRAVENOUS

## 2020-12-11 MED ORDER — LORATADINE 10 MG PO TABS
10.0000 mg | ORAL_TABLET | Freq: Once | ORAL | Status: AC
  Administered 2020-12-11: 10 mg via ORAL
  Filled 2020-12-11: qty 1

## 2020-12-11 NOTE — Progress Notes (Signed)
Verbal order received from [place] PA. Infuse one unit of blood today after Iron infusion. Called lab and spoke with blood bank. Patient has no history of antibodies at this time.

## 2020-12-11 NOTE — Progress Notes (Signed)
One unit of blood and iron and b12 injection given today per orders. Patient tolerated it well without problems. Vitals stable and discharged home from clinic via wheelchair. Follow up as scheduled.

## 2020-12-11 NOTE — Patient Instructions (Signed)
[facility]  Discharge Instructions: Thank you for choosing [facility] to provide your oncology and hematology care.  If you have a lab appointment with the Cancer Center, please come in thru the Main Entrance and check in at the main information desk.     We strive to give you quality time with your provider. You may need to reschedule your appointment if you arrive late (15 or more minutes).  Arriving late affects you and other patients whose appointments are after yours.  Also, if you miss three or more appointments without notifying the office, you may be dismissed from the clinic at the provider's discretion.      For prescription refill requests, have your pharmacy contact our office and allow 72 hours for refills to be completed.     Should you have questions after your visit or need to cancel or reschedule your appointment, please contact [facility] [phone]  and follow the prompts.  Office hours are 8:00 a.m. to 4:30 p.m. Monday - Friday. Please note that voicemails left after 4:00 p.m. may not be returned until the following business day.  We are closed weekends and major holidays. You have access to a nurse at all times for urgent questions. Please call the main number to the clinic [phone] and follow the prompts.  For any non-urgent questions, you may also contact your provider using MyChart. We now offer e-Visits for anyone 18 and older to request care online for non-urgent symptoms. For details visit [email or web address].   Also download the MyChart app! Go to the app store, search "MyChart", open the app, select [facility], and log in with your MyChart username and password.  Due to Covid, a mask is required upon entering the hospital/clinic. If you do not have a mask, one will be given to you upon arrival. For doctor visits, patients may have 1 support person aged 18 or older with them. For treatment visits, patients cannot have  anyone with them due to current Covid guidelines and our immunocompromised population.  

## 2020-12-11 NOTE — Progress Notes (Signed)
Patient presents today for iron infusion.  Labs reviewed by Tarri Abernethy, PA-C and we will add one unit of PRBC today per hemoglobin of 7.  Blood bank notified.  Patient is in satisfactory condition with no new complaints voiced.  Vital signs are stable.

## 2020-12-11 NOTE — Progress Notes (Signed)
Chaplain engaged in a follow-up visit with Savannah Burns.  Savannah Burns expressed that she has decided to get a whole new healthcare team.  She really wants to figure out what has been happening to her body and find the source of the bleeding that has been happening.  Before getting her infusion on Monday, Savannah Burns declared that she felt terrible and that she could barely do anything.  She expressed that she was extremely weak and fatigued.  That time took a lot out of her.  She is feeling more hopeful today after speaking with staff about getting new physicians.    Savannah Burns also shared with Chaplain about praying for her family.  Savannah Burns has found herself enjoying church and being in community with others.  That has been a great experience for her and she hopes to continue attending a church that she really likes.  Savannah Burns is thinking deeply about some of the dynamics that have existed in her family and she would like to see some change.  Savannah Burns spent time talking about her mother's journey today as well.  Her mother used to receive infusions too.  Eventually, her mother decided she no longer wanted to do them anymore and was prepared to die.  Savannah Burns talked about the loneliness her mom must of felt as she experiences some of the same medical challenges.   Chaplain offered support, presence, and listening. Chaplain was able to affirm how hard this journey has been for her while also uplifting the hope she has.  Savannah Burns declared, "I'm not ready" to signify her willingness to keep going.     12/11/20 1100  Clinical Encounter Type  Visited With Patient  Visit Type Follow-up;Spiritual support

## 2020-12-11 NOTE — Patient Instructions (Signed)
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  IRON DEFICIENCY ANEMIA: Based on your dark stool and your decreased hemoglobin, it appears that you are losing blood from your intestines again.  We have transfused you with 2 units of red blood cells this week, with further plan as below: Recheck blood count on Monday & Friday next week, followed by weekly labs on Fridays.  We will give additional blood transfusions at that time if needed. Dr. Abbey Chatters (gastroenterology) will have his office reach out to you on Monday to schedule an EGD/colonoscopy for next week to try to find the source of bleeding. If you experience any worsening symptoms over the weekend, please seek immediate medical attention and proceed to the emergency department. Please make an appointment with ENT for further examination of possible swollen blood vessels at the base of your tongue that may also be a source of bleeding.  SMOLDERING MYELOMA: Your labs are stable, and do not show any signs of progression to multiple myeloma at this time.  We will recheck these labs at your follow-up visit in 3 months.  LABS: Return on Monday for repeat labs  FOLLOW-UP APPOINTMENT: Phone visit in 1 month   Thank you for choosing [facility] at [facility] to provide your oncology and hematology care.  To afford each patient quality time with our provider, please arrive at least 15 minutes before your scheduled appointment time.   If you have a lab appointment with the [facility] please come in thru the Main Entrance and check in at the main information desk.  You need to re-schedule your appointment should you arrive 10 or more minutes late.  We strive to give you quality time with our providers, and arriving late affects you and other patients whose appointments are after yours.  Also,  if you no show three or more times for appointments you may be dismissed from the clinic at the providers discretion.     Again, thank you for choosing [facility].  Our hope is that these requests will decrease the amount of time that you wait before being seen by our physicians.       _____________________________________________________________  Should you have questions after your visit to [facility], please contact our office at [phone] and follow the prompts.  Our office hours are 8:00 a.m. and 4:30 p.m. Monday - Friday.  Please note that voicemails left after 4:00 p.m. may not be returned until the following business day.  We are closed weekends and major holidays.  You do have access to a nurse 24-7, just call the main number to the clinic [phone] and do not press any options, hold on the line and a nurse will answer the phone.    For prescription refill requests, have your pharmacy contact our office and allow 72 hours.    Due to Covid, you will need to wear a mask upon entering the hospital. If you do not have a mask, a mask will be given to you at the Main Entrance upon arrival. For doctor visits, patients may have 1 support person age 59 or older with them. For treatment visits, patients can not have anyone with them due to social distancing guidelines and our immunocompromised population.

## 2020-12-13 LAB — TYPE AND SCREEN
ABO/RH(D): A POS
Antibody Screen: NEGATIVE
Unit division: 0

## 2020-12-13 LAB — BPAM RBC
Blood Product Expiration Date: 202210192359
ISSUE DATE / TIME: 202209161247
Unit Type and Rh: 6200

## 2020-12-14 ENCOUNTER — Inpatient Hospital Stay (HOSPITAL_COMMUNITY): Payer: Medicare PPO

## 2020-12-14 ENCOUNTER — Other Ambulatory Visit: Payer: Self-pay

## 2020-12-14 DIAGNOSIS — I10 Essential (primary) hypertension: Secondary | ICD-10-CM | POA: Diagnosis not present

## 2020-12-14 DIAGNOSIS — E785 Hyperlipidemia, unspecified: Secondary | ICD-10-CM | POA: Diagnosis not present

## 2020-12-14 DIAGNOSIS — Z85828 Personal history of other malignant neoplasm of skin: Secondary | ICD-10-CM | POA: Diagnosis not present

## 2020-12-14 DIAGNOSIS — E119 Type 2 diabetes mellitus without complications: Secondary | ICD-10-CM | POA: Diagnosis not present

## 2020-12-14 DIAGNOSIS — C9000 Multiple myeloma not having achieved remission: Secondary | ICD-10-CM | POA: Diagnosis not present

## 2020-12-14 DIAGNOSIS — D509 Iron deficiency anemia, unspecified: Secondary | ICD-10-CM | POA: Diagnosis not present

## 2020-12-14 DIAGNOSIS — D3502 Benign neoplasm of left adrenal gland: Secondary | ICD-10-CM | POA: Diagnosis not present

## 2020-12-14 DIAGNOSIS — D500 Iron deficiency anemia secondary to blood loss (chronic): Secondary | ICD-10-CM

## 2020-12-14 LAB — CBC WITH DIFFERENTIAL/PLATELET
Abs Immature Granulocytes: 0.05 10*3/uL (ref 0.00–0.07)
Basophils Absolute: 0 10*3/uL (ref 0.0–0.1)
Basophils Relative: 0 %
Eosinophils Absolute: 0.1 10*3/uL (ref 0.0–0.5)
Eosinophils Relative: 1 %
HCT: 28.9 % — ABNORMAL LOW (ref 36.0–46.0)
Hemoglobin: 8.5 g/dL — ABNORMAL LOW (ref 12.0–15.0)
Immature Granulocytes: 1 %
Lymphocytes Relative: 21 %
Lymphs Abs: 1.4 10*3/uL (ref 0.7–4.0)
MCH: 26.6 pg (ref 26.0–34.0)
MCHC: 29.4 g/dL — ABNORMAL LOW (ref 30.0–36.0)
MCV: 90.6 fL (ref 80.0–100.0)
Monocytes Absolute: 0.5 10*3/uL (ref 0.1–1.0)
Monocytes Relative: 8 %
Neutro Abs: 4.8 10*3/uL (ref 1.7–7.7)
Neutrophils Relative %: 69 %
Platelets: 107 10*3/uL — ABNORMAL LOW (ref 150–400)
RBC: 3.19 MIL/uL — ABNORMAL LOW (ref 3.87–5.11)
RDW: 19.5 % — ABNORMAL HIGH (ref 11.5–15.5)
WBC: 6.9 10*3/uL (ref 4.0–10.5)
nRBC: 0 % (ref 0.0–0.2)

## 2020-12-14 LAB — SAMPLE TO BLOOD BANK

## 2020-12-15 ENCOUNTER — Ambulatory Visit: Payer: Medicare PPO | Admitting: Licensed Clinical Social Worker

## 2020-12-15 DIAGNOSIS — I152 Hypertension secondary to endocrine disorders: Secondary | ICD-10-CM

## 2020-12-15 DIAGNOSIS — E1159 Type 2 diabetes mellitus with other circulatory complications: Secondary | ICD-10-CM

## 2020-12-15 DIAGNOSIS — F339 Major depressive disorder, recurrent, unspecified: Secondary | ICD-10-CM

## 2020-12-15 DIAGNOSIS — E785 Hyperlipidemia, unspecified: Secondary | ICD-10-CM

## 2020-12-15 DIAGNOSIS — E669 Obesity, unspecified: Secondary | ICD-10-CM

## 2020-12-15 DIAGNOSIS — E1169 Type 2 diabetes mellitus with other specified complication: Secondary | ICD-10-CM

## 2020-12-15 DIAGNOSIS — I5032 Chronic diastolic (congestive) heart failure: Secondary | ICD-10-CM

## 2020-12-15 DIAGNOSIS — D500 Iron deficiency anemia secondary to blood loss (chronic): Secondary | ICD-10-CM

## 2020-12-15 DIAGNOSIS — J449 Chronic obstructive pulmonary disease, unspecified: Secondary | ICD-10-CM

## 2020-12-15 NOTE — Chronic Care Management (AMB) (Signed)
Chronic Care Management    Clinical Social Work Note  12/15/2020 Name: Savannah Burns MRN: [id number] DOB: 23-Jan-1951  Savannah Burns is a 70 y.o. year old female who is a primary care patient of [name], DO. The CCM team was consulted to assist the patient with chronic disease management and/or care coordination needs related to: [employer] .   Engaged with patient by telephone for follow up visit in response to provider referral for social work chronic care management and care coordination services.   Consent to Services:  The patient was given information about Chronic Care Management services, agreed to services, and gave verbal consent prior to initiation of services.  Please see initial visit note for detailed documentation.   Patient agreed to services and consent obtained.   Assessment: Review of patient past medical history, allergies, medications, and health status, including review of relevant consultants reports was performed today as part of a comprehensive evaluation and provision of chronic care management and care coordination services.     SDOH (Social Determinants of Health) assessments and interventions performed:  SDOH Interventions    Flowsheet Row Most Recent Value  SDOH Interventions   Physical Activity Interventions Other (Comments)  [uses a walker to help her walk]  Stress Interventions Provide Counseling  [client has stress related to family conflict issues]  Depression Interventions/Treatment  Medication        Advanced Directives Status: See Vynca application for related entries.  CCM Care Plan  Allergies  Allergen Reactions   Keflex [Cephalexin] Nausea And Vomiting    Outpatient Encounter Medications as of 12/15/2020  Medication Sig Note   Blood Glucose Calibration (GLUCOMETER DEX HIGH CONTROL) LIQD Test CBG's once daily.     Blood Glucose Monitoring Suppl (TRUE METRIX AIR GLUCOSE METER) w/Device KIT Check BS daily Dx  E11.9    Cholecalciferol (VITAMIN D) 50 MCG (2000 UT) tablet Take 4,000 Units by mouth daily.    erythromycin ophthalmic ointment Place 1 application into the left eye at bedtime.    escitalopram (LEXAPRO) 10 MG tablet Take 1 tablet (10 mg total) by mouth daily.    esomeprazole (NEXIUM) 20 MG capsule Take 1 capsule (20 mg total) by mouth daily at 12 noon.    ferrous sulfate 325 (65 FE) MG tablet Take 1 tablet (325 mg total) by mouth 2 (two) times daily with a meal. (Patient taking differently: Take 325 mg by mouth daily with breakfast.)    furosemide (LASIX) 20 MG tablet Take 20 mg by mouth. bid    glucose blood (TRUE METRIX BLOOD GLUCOSE TEST) test strip Check BS daily Dx E11.9    lisinopril (ZESTRIL) 10 MG tablet Take 1 tablet by mouth once daily (Patient taking differently: Take 10 mg by mouth daily.)    lovastatin (MEVACOR) 20 MG tablet Take 1 tablet (20 mg total) by mouth at bedtime.    metFORMIN (GLUCOPHAGE) 1000 MG tablet Take 1 tablet (1,000 mg total) by mouth daily with breakfast.    nadolol (CORGARD) 20 MG tablet Take 1 tablet (20 mg total) by mouth daily. (Patient not taking: Reported on 12/09/2020) 08/04/2020: No longer taking   nystatin cream (MYCOSTATIN) Apply 1 application topically 2 (two) times daily. X14 days or until rash resolved    oxyCODONE-acetaminophen (PERCOCET) 10-325 MG tablet Take 1 tablet by mouth every 6 (six) hours as needed for pain.    OXYGEN Inhale 3 L into the lungs continuous.    potassium chloride SA (KLOR-CON) 20 MEQ  tablet Take 2 tablets (40 mEq total) by mouth 2 (two) times daily. (Patient taking differently: Take 40 mEq by mouth daily.)    torsemide (DEMADEX) 20 MG tablet Take 3 tablets (60 mg total) by mouth 2 (two) times daily. (Patient taking differently: Take 50 mg by mouth 2 (two) times daily.)    TRUEplus Lancets 30G MISC Check BS daily Dx E11.9    vitamin B-12 (CYANOCOBALAMIN) 1000 MCG tablet Take 2,000 mcg by mouth daily.    [DISCONTINUED] albuterol  (PROVENTIL HFA;VENTOLIN HFA) 108 (90 Base) MCG/ACT inhaler Inhale 2 puffs into the lungs every 6 (six) hours as needed for wheezing or shortness of breath.    No facility-administered encounter medications on file as of 12/15/2020.    Patient Active Problem List   Diagnosis Date Noted   Hypoalbuminemia 05/17/2020   Moderate protein malnutrition ([place]) 05/17/2020   Melena 05/16/2020   Hyperlipidemia    Hypokalemia    GI bleed 03/20/2020   Lower GI bleed 03/19/2020   Acute on chronic congestive heart failure (HCC)    Symptomatic anemia    Iron deficiency anemia    Heme positive stool    ABLA (acute blood loss anemia) 01/22/2020   Supplemental oxygen dependent 10/22/2019   Chronic dyspnea 10/22/2019   Hypertension associated with diabetes ([place]) 10/22/2019   Bilateral hand pain 09/21/2016   Fatigue 07/30/2015   Chronic diastolic HF (heart failure) ([place]) 06/29/2015   Postmenopausal bleeding 04/17/2015   Excessive daytime sleepiness 12/19/2014   Swelling of lower extremity 11/27/2014   Back pain 06/13/2013   Sinusitis, chronic 05/30/2012   Allergic rhinitis 06/06/2011   Hyperlipidemia associated with type 2 diabetes mellitus ([place]) 03/24/2009   Diabetes mellitus, type 2 ([place]) 08/12/2008   Pulmonary nodule 08/29/2007   COPD (chronic obstructive pulmonary disease) ([place]) 02/14/2007   Class 3 obesity 05/25/2006   TOBACCO DEPENDENCE 05/25/2006   Depression, recurrent ([place]) 05/25/2006   HYPERTENSION, BENIGN SYSTEMIC 05/25/2006   DJD, UNSPECIFIED 05/25/2006    Conditions to be addressed/monitored: monitor client management of anxiety and stress; issues; monitor client management of depression issues  Care Plan : LCSW Care Plan  Updates made by Katha Cabal, LCSW since 12/15/2020 12:00 AM     Problem: Emotional Distress      Goal: Emotional Health Supported; Client to manage stress and anxiety issues faced.   Start Date: 12/15/2020  Expected End Date: 03/15/2021  This Visit's  Progress: On track  Priority: Medium  Note:   Current Barriers:  Pain issues Mobility issues ADLs completion challenges Stress issues Suicidal Ideation/Homicidal Ideation: No  Clinical Social Work Goal(s):  patient will work with SW  by telephone or in person to reduce or manage symptoms related to stress and anxiety management Patient will work with LCSW in next 30 days to discuss client completion of ADLs and to discuss mobility issues of client Patient will call RNCM in next 30 days as needed for nursing support  Interventions: 1:1 collaboration with [name], DO regarding development and update of comprehensive plan of care as evidenced by provider attestation and co-signature Talked with client about ADLs completion of client Talked with client about transport needs of client Talked with client about mood of client. Client has been sad occasionally due to family conflict issues with her daughter. Talked with client about Diabetes management.  LCSW, with client permission, talked with Dr. Lottie Dawson about Diabetes management. Client, Dr. Blanca Friend, and LCSW spoke via phone today about client needs. Dr. Blanca Friend to  research CGM possibly for client. Client agreed for this plan LCSW talked with client about client support in the home with her granddaughter, Ishmael Holter Talked with client about decreased energy of client. Client talked of her iron infusions and of her blood infusions received. Talked with client about edema. She spoke of edema in her legs.  Provided counseling support for client Talked with [name] about ambulation of client. She uses a walker to help her walk Talked with [name] about Medicaid application process through [place].  Patient Self Care Activities:  Completes ADLs as she is able Attends scheduled medical appointments Has transport help as needed  Patient Coping Strengths:  Has support from her granddaughter,  Marney Setting medications as prescribed Attends scheduled medical appointments  Patient Self Care Deficits:  Mobility issues Family stress issues Some pain issues  Patient Goals:  - spend time or talk with others at least 2 to 3 times per week - practice relaxation or meditation daily - keep a calendar with appointment dates  Follow Up Plan: LCSW to call client on 01/27/21 at 2:30 PM to assess client needs     [name].Kanoa Phillippi MSW, LCSW Licensed Clinical Social Worker [facility] Care Management [phone]

## 2020-12-15 NOTE — Patient Instructions (Signed)
Visit Information  PATIENT GOALS:  Goals Addressed             This Visit's Progress    Protect My Health;Manage Depression issues faced; Manage anxiety issues faced       Timeframe:  Short-Term Goal Priority:  Medium Progress: On Track Start Date:           12/15/20              Expected End Date:         03/14/21           Follow Up Date  01/27/21 at 2:30 PM   Protect My Health (Patient) Manage Depression issues . Manage anxiety issues faced   Why is this important?   Screening tests can find diseases early when they are easier to treat.  Your doctor or nurse will talk with you about which tests are important for you.  Getting shots for common diseases like the flu and shingles will help prevent them.     Patient Self Care Activities:  Self administers medications as prescribed Attends all scheduled provider appointments Performs ADL's independently  Patient Coping Strengths:  [place] friends  Patient Self Care Deficits:  Depression issues Mobility issues Low energy  Patient Goals:  - spend time or talk with others at least 2 to 3 times per week - practice relaxation or meditation daily - keep a calendar with appointment dates  Follow Up Plan: LCSW to call client on 01/27/21 at 2:30 PM to assess client needs     [name].Eero Dini MSW, LCSW Licensed Clinical Social Worker [facility] Care Management [phone]

## 2020-12-17 ENCOUNTER — Telehealth: Payer: Self-pay | Admitting: Gastroenterology

## 2020-12-17 MED FILL — Iron Sucrose Inj 20 MG/ML (Fe Equiv): INTRAVENOUS | Qty: 15 | Status: AC

## 2020-12-17 NOTE — Telephone Encounter (Signed)
Notified by Hematology regarding drop in Hgb from baseline 9 range down to 7.0. and reported melena. Received blood transfusion yesterday.  Can we reach out to patient and see if still having melena? She has had a thorough evaluation including colonoscopy, EGD, and most recently EGD with capsule deployment in May. 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 Capsul with what looked like old blood in distal part of small bowel before reaching cecum.   She has a history of cirrhosis, so there's a fine line here between outpatient expedited evaluation and inpatient evaluation.   If she has persistent melena and symptomatic, needs to go to the ED.   We need to have follow-up in office this week if at all possible, if not next week, any APP.   Please have her repeat CBC on Monday. To ED if continues.

## 2020-12-17 NOTE — Telephone Encounter (Signed)
Phoned the pt and LMOVM to return call

## 2020-12-18 ENCOUNTER — Other Ambulatory Visit: Payer: Self-pay

## 2020-12-18 ENCOUNTER — Ambulatory Visit (HOSPITAL_COMMUNITY): Payer: Medicare PPO | Admitting: Physician Assistant

## 2020-12-18 ENCOUNTER — Inpatient Hospital Stay (HOSPITAL_COMMUNITY): Payer: Medicare PPO

## 2020-12-18 VITALS — BP 115/49 | HR 80 | Temp 98.7°F | Resp 18

## 2020-12-18 DIAGNOSIS — D509 Iron deficiency anemia, unspecified: Secondary | ICD-10-CM | POA: Diagnosis not present

## 2020-12-18 DIAGNOSIS — E785 Hyperlipidemia, unspecified: Secondary | ICD-10-CM | POA: Diagnosis not present

## 2020-12-18 DIAGNOSIS — Z85828 Personal history of other malignant neoplasm of skin: Secondary | ICD-10-CM | POA: Diagnosis not present

## 2020-12-18 DIAGNOSIS — E119 Type 2 diabetes mellitus without complications: Secondary | ICD-10-CM | POA: Diagnosis not present

## 2020-12-18 DIAGNOSIS — I10 Essential (primary) hypertension: Secondary | ICD-10-CM | POA: Diagnosis not present

## 2020-12-18 DIAGNOSIS — D500 Iron deficiency anemia secondary to blood loss (chronic): Secondary | ICD-10-CM

## 2020-12-18 DIAGNOSIS — D3502 Benign neoplasm of left adrenal gland: Secondary | ICD-10-CM | POA: Diagnosis not present

## 2020-12-18 DIAGNOSIS — C9000 Multiple myeloma not having achieved remission: Secondary | ICD-10-CM | POA: Diagnosis not present

## 2020-12-18 LAB — CBC WITH DIFFERENTIAL/PLATELET
Abs Immature Granulocytes: 0.02 10*3/uL (ref 0.00–0.07)
Basophils Absolute: 0 10*3/uL (ref 0.0–0.1)
Basophils Relative: 0 %
Eosinophils Absolute: 0.1 10*3/uL (ref 0.0–0.5)
Eosinophils Relative: 1 %
HCT: 29.9 % — ABNORMAL LOW (ref 36.0–46.0)
Hemoglobin: 8.3 g/dL — ABNORMAL LOW (ref 12.0–15.0)
Immature Granulocytes: 0 %
Lymphocytes Relative: 27 %
Lymphs Abs: 1.3 10*3/uL (ref 0.7–4.0)
MCH: 26.2 pg (ref 26.0–34.0)
MCHC: 27.8 g/dL — ABNORMAL LOW (ref 30.0–36.0)
MCV: 94.3 fL (ref 80.0–100.0)
Monocytes Absolute: 0.4 10*3/uL (ref 0.1–1.0)
Monocytes Relative: 9 %
Neutro Abs: 2.9 10*3/uL (ref 1.7–7.7)
Neutrophils Relative %: 63 %
Platelets: 104 10*3/uL — ABNORMAL LOW (ref 150–400)
RBC: 3.17 MIL/uL — ABNORMAL LOW (ref 3.87–5.11)
RDW: 19.9 % — ABNORMAL HIGH (ref 11.5–15.5)
WBC: 4.7 10*3/uL (ref 4.0–10.5)
nRBC: 0 % (ref 0.0–0.2)

## 2020-12-18 LAB — SAMPLE TO BLOOD BANK

## 2020-12-18 MED ORDER — SODIUM CHLORIDE 0.9 % IV SOLN
300.0000 mg | Freq: Once | INTRAVENOUS | Status: AC
  Administered 2020-12-18: 300 mg via INTRAVENOUS
  Filled 2020-12-18: qty 300

## 2020-12-18 MED ORDER — ACETAMINOPHEN 325 MG PO TABS
650.0000 mg | ORAL_TABLET | Freq: Once | ORAL | Status: AC
  Administered 2020-12-18: 650 mg via ORAL
  Filled 2020-12-18: qty 2

## 2020-12-18 MED ORDER — LORATADINE 10 MG PO TABS
10.0000 mg | ORAL_TABLET | Freq: Once | ORAL | Status: AC
  Administered 2020-12-18: 10 mg via ORAL
  Filled 2020-12-18: qty 1

## 2020-12-18 MED ORDER — SODIUM CHLORIDE 0.9 % IV SOLN: Freq: Once | INTRAVENOUS | Status: AC

## 2020-12-18 NOTE — Patient Instructions (Signed)
[facility]  Discharge Instructions: Thank you for choosing [facility] to provide your oncology and hematology care.  If you have a lab appointment with the [facility], please come in thru the Main Entrance and check in at the main information desk.  Wear comfortable clothing and clothing appropriate for easy access to any Portacath or PICC line.   We strive to give you quality time with your provider. You may need to reschedule your appointment if you arrive late (15 or more minutes).  Arriving late affects you and other patients whose appointments are after yours.  Also, if you miss three or more appointments without notifying the office, you may be dismissed from the clinic at the provider's discretion.      For prescription refill requests, have your pharmacy contact our office and allow 72 hours for refills to be completed.    Today you received the following chemotherapy and/or immunotherapy agents Venofer IV iron infusion.   To help prevent nausea and vomiting after your treatment, we encourage you to take your nausea medication as directed.  BELOW ARE SYMPTOMS THAT SHOULD BE REPORTED IMMEDIATELY: *FEVER GREATER THAN 100.4 F (38 C) OR HIGHER *CHILLS OR SWEATING *NAUSEA AND VOMITING THAT IS NOT CONTROLLED WITH YOUR NAUSEA MEDICATION *UNUSUAL SHORTNESS OF BREATH *UNUSUAL BRUISING OR BLEEDING *URINARY PROBLEMS (pain or burning when urinating, or frequent urination) *BOWEL PROBLEMS (unusual diarrhea, constipation, pain near the anus) TENDERNESS IN MOUTH AND THROAT WITH OR WITHOUT PRESENCE OF ULCERS (sore throat, sores in mouth, or a toothache) UNUSUAL RASH, SWELLING OR PAIN  UNUSUAL VAGINAL DISCHARGE OR ITCHING   Items with * indicate a potential emergency and should be followed up as soon as possible or go to the Emergency Department if any problems should occur.  Please show the CHEMOTHERAPY ALERT CARD or IMMUNOTHERAPY ALERT CARD at check-in to the  Emergency Department and triage nurse.  Should you have questions after your visit or need to cancel or reschedule your appointment, please contact [facility] [phone]  and follow the prompts.  Office hours are 8:00 a.m. to 4:30 p.m. Monday - Friday. Please note that voicemails left after 4:00 p.m. may not be returned until the following business day.  We are closed weekends and major holidays. You have access to a nurse at all times for urgent questions. Please call the main number to the clinic [phone] and follow the prompts.  For any non-urgent questions, you may also contact your provider using MyChart. We now offer e-Visits for anyone 71 and older to request care online for non-urgent symptoms. For details visit [email or web address].   Also download the MyChart app! Go to the app store, search "MyChart", open the app, select [facility], and log in with your MyChart username and password.  Due to Covid, a mask is required upon entering the hospital/clinic. If you do not have a mask, one will be given to you upon arrival. For doctor visits, patients may have 1 support person aged 71 or older with them. For treatment visits, patients cannot have anyone with them due to current Covid guidelines and our immunocompromised population.

## 2020-12-18 NOTE — Progress Notes (Signed)
Pt presents today for Venofer IV iron infusion per provider's order. Vital signs stable and pt voiced no new complaints at this time.  Peripheral IV started with good blood return pre and post infusion.  Venofer given today per MD orders. Tolerated infusion without adverse affects. Vital signs stable. No complaints at this time. Discharged from clinic via wheelchair in stable condition. Alert and oriented x 3. F/U with [facility] as scheduled.

## 2020-12-18 NOTE — Progress Notes (Signed)
Chaplain engaged in a follow-up visit with Savannah Burns.  She shared about her hopes of getting work done on her house.  She expressed that when she bought her house she had never thought about what it would mean to be immobile or unable to keep it up.  She had not thought about what it would mean to get older with the property that she has.  Savannah Burns explained again from previous visits how she lives [place] a steep hill and how hard it is to [place] the yard.  She used to be able to mow the land herself but is no longer able to do that.  She has asked for help from several other people but it has been hard to get consistent help that does the work accordingly.  She stated that she has very tall grass in her backyard and a growing wasps nest that needs to be taken care of.  Savannah Burns also talked about the shared about the state of the inside of her house and the desire to get some housework done.  She has not had any luck in finding a trusted and consistent person to clean her home.    Savannah Burns also talked about needing to discover where her internal bleeding is coming from.  She desires to have some sort of normalcy back and be able to do more activities.  She wants some semblance of her life back.  Chaplain talked with Savannah Burns about following up on necessary appointments that may provide her the answers she desires.  She believes that in seeing the ENT doctor she may discover the source of what has been happening to her body.    Savannah Burns is currently finding joy in having a new faith community and being able to go to church.    Chaplain affirmed Shamika's want to get her house taken care of and the way our homes can impact our spirits and mindset.  Chaplain suggested for Savannah Burns to speak with the social worker about any resources available to her to have help around her home.  Chaplain will follow-up with the social worker as well.  Chaplain offered listening, support and presence.    Chaplain will continue to  follow-up.     12/18/20 1000  Clinical Encounter Type  Visited With Patient  Visit Type Follow-up;Spiritual support

## 2020-12-21 NOTE — Telephone Encounter (Signed)
Phoned and LMOVM for the pt to return call

## 2020-12-24 ENCOUNTER — Telehealth: Payer: Medicare PPO | Admitting: *Deleted

## 2020-12-24 ENCOUNTER — Telehealth: Payer: Self-pay | Admitting: *Deleted

## 2020-12-24 DIAGNOSIS — J449 Chronic obstructive pulmonary disease, unspecified: Secondary | ICD-10-CM | POA: Diagnosis not present

## 2020-12-24 NOTE — Telephone Encounter (Signed)
  Care Management   Follow Up Note   12/24/2020 Name: Savannah Burns MRN: [id number] DOB: [birth year]   Referred by: [name], DO Reason for referral : Chronic Care Management (Unsuccessful follow-up)   A second unsuccessful telephone outreach was attempted today. The patient was referred to the case management team for assistance with care management and care coordination.   Follow Up Plan: A HIPPA compliant phone message was left for the patient providing contact information and requesting a return call. Forwarding to [facility] Care Guide for outreach and rescheduling.  [name], BSN, RN-BC Embedded Chronic Care Manager [facility] / [facility] Direct Dial: [phone]

## 2020-12-25 ENCOUNTER — Inpatient Hospital Stay (HOSPITAL_COMMUNITY): Payer: Medicare PPO

## 2020-12-25 ENCOUNTER — Other Ambulatory Visit: Payer: Self-pay

## 2020-12-25 VITALS — BP 151/79 | HR 80 | Temp 97.5°F | Resp 20

## 2020-12-25 DIAGNOSIS — E1169 Type 2 diabetes mellitus with other specified complication: Secondary | ICD-10-CM

## 2020-12-25 DIAGNOSIS — Z85828 Personal history of other malignant neoplasm of skin: Secondary | ICD-10-CM | POA: Diagnosis not present

## 2020-12-25 DIAGNOSIS — I5032 Chronic diastolic (congestive) heart failure: Secondary | ICD-10-CM | POA: Diagnosis not present

## 2020-12-25 DIAGNOSIS — J449 Chronic obstructive pulmonary disease, unspecified: Secondary | ICD-10-CM | POA: Diagnosis not present

## 2020-12-25 DIAGNOSIS — C9000 Multiple myeloma not having achieved remission: Secondary | ICD-10-CM | POA: Diagnosis not present

## 2020-12-25 DIAGNOSIS — E785 Hyperlipidemia, unspecified: Secondary | ICD-10-CM

## 2020-12-25 DIAGNOSIS — I10 Essential (primary) hypertension: Secondary | ICD-10-CM | POA: Diagnosis not present

## 2020-12-25 DIAGNOSIS — D509 Iron deficiency anemia, unspecified: Secondary | ICD-10-CM | POA: Diagnosis not present

## 2020-12-25 DIAGNOSIS — D500 Iron deficiency anemia secondary to blood loss (chronic): Secondary | ICD-10-CM

## 2020-12-25 DIAGNOSIS — F339 Major depressive disorder, recurrent, unspecified: Secondary | ICD-10-CM | POA: Diagnosis not present

## 2020-12-25 DIAGNOSIS — D3502 Benign neoplasm of left adrenal gland: Secondary | ICD-10-CM | POA: Diagnosis not present

## 2020-12-25 DIAGNOSIS — E1159 Type 2 diabetes mellitus with other circulatory complications: Secondary | ICD-10-CM | POA: Diagnosis not present

## 2020-12-25 DIAGNOSIS — I152 Hypertension secondary to endocrine disorders: Secondary | ICD-10-CM | POA: Diagnosis not present

## 2020-12-25 DIAGNOSIS — E119 Type 2 diabetes mellitus without complications: Secondary | ICD-10-CM | POA: Diagnosis not present

## 2020-12-25 LAB — CBC WITH DIFFERENTIAL/PLATELET
Abs Immature Granulocytes: 0 10*3/uL (ref 0.00–0.07)
Basophils Absolute: 0 10*3/uL (ref 0.0–0.1)
Basophils Relative: 0 %
Eosinophils Absolute: 0.1 10*3/uL (ref 0.0–0.5)
Eosinophils Relative: 1 %
HCT: 29.4 % — ABNORMAL LOW (ref 36.0–46.0)
Hemoglobin: 8.2 g/dL — ABNORMAL LOW (ref 12.0–15.0)
Immature Granulocytes: 0 %
Lymphocytes Relative: 29 %
Lymphs Abs: 1.1 10*3/uL (ref 0.7–4.0)
MCH: 27.2 pg (ref 26.0–34.0)
MCHC: 27.9 g/dL — ABNORMAL LOW (ref 30.0–36.0)
MCV: 97.4 fL (ref 80.0–100.0)
Monocytes Absolute: 0.4 10*3/uL (ref 0.1–1.0)
Monocytes Relative: 10 %
Neutro Abs: 2.2 10*3/uL (ref 1.7–7.7)
Neutrophils Relative %: 60 %
Platelets: 112 10*3/uL — ABNORMAL LOW (ref 150–400)
RBC: 3.02 MIL/uL — ABNORMAL LOW (ref 3.87–5.11)
RDW: 21.3 % — ABNORMAL HIGH (ref 11.5–15.5)
WBC: 3.8 10*3/uL — ABNORMAL LOW (ref 4.0–10.5)
nRBC: 0 % (ref 0.0–0.2)

## 2020-12-25 LAB — SAMPLE TO BLOOD BANK

## 2020-12-25 MED ORDER — SODIUM CHLORIDE 0.9 % IV SOLN: Freq: Once | INTRAVENOUS | Status: AC

## 2020-12-25 MED ORDER — SODIUM CHLORIDE 0.9 % IV SOLN
400.0000 mg | Freq: Once | INTRAVENOUS | Status: AC
  Administered 2020-12-25: 400 mg via INTRAVENOUS
  Filled 2020-12-25: qty 20

## 2020-12-25 MED ORDER — ACETAMINOPHEN 325 MG PO TABS
650.0000 mg | ORAL_TABLET | Freq: Once | ORAL | Status: AC
  Administered 2020-12-25: 650 mg via ORAL
  Filled 2020-12-25: qty 2

## 2020-12-25 MED ORDER — LORATADINE 10 MG PO TABS
10.0000 mg | ORAL_TABLET | Freq: Once | ORAL | Status: AC
  Administered 2020-12-25: 10 mg via ORAL
  Filled 2020-12-25: qty 1

## 2020-12-25 NOTE — Patient Instructions (Signed)
[facility]  Discharge Instructions: Thank you for choosing [facility] to provide your oncology and hematology care.  If you have a lab appointment with the [facility], please come in thru the Main Entrance and check in at the main information desk.  Wear comfortable clothing and clothing appropriate for easy access to any Portacath or PICC line.   We strive to give you quality time with your provider. You may need to reschedule your appointment if you arrive late (15 or more minutes).  Arriving late affects you and other patients whose appointments are after yours.  Also, if you miss three or more appointments without notifying the office, you may be dismissed from the clinic at the provider's discretion.      For prescription refill requests, have your pharmacy contact our office and allow 72 hours for refills to be completed.    Today you received the following chemotherapy and/or immunotherapy agents Venofer      To help prevent nausea and vomiting after your treatment, we encourage you to take your nausea medication as directed.  BELOW ARE SYMPTOMS THAT SHOULD BE REPORTED IMMEDIATELY: *FEVER GREATER THAN 100.4 F (38 C) OR HIGHER *CHILLS OR SWEATING *NAUSEA AND VOMITING THAT IS NOT CONTROLLED WITH YOUR NAUSEA MEDICATION *UNUSUAL SHORTNESS OF BREATH *UNUSUAL BRUISING OR BLEEDING *URINARY PROBLEMS (pain or burning when urinating, or frequent urination) *BOWEL PROBLEMS (unusual diarrhea, constipation, pain near the anus) TENDERNESS IN MOUTH AND THROAT WITH OR WITHOUT PRESENCE OF ULCERS (sore throat, sores in mouth, or a toothache) UNUSUAL RASH, SWELLING OR PAIN  UNUSUAL VAGINAL DISCHARGE OR ITCHING   Items with * indicate a potential emergency and should be followed up as soon as possible or go to the Emergency Department if any problems should occur.  Please show the CHEMOTHERAPY ALERT CARD or IMMUNOTHERAPY ALERT CARD at check-in to the Emergency  Department and triage nurse.  Should you have questions after your visit or need to cancel or reschedule your appointment, please contact [facility] [phone]  and follow the prompts.  Office hours are 8:00 a.m. to 4:30 p.m. Monday - Friday. Please note that voicemails left after 4:00 p.m. may not be returned until the following business day.  We are closed weekends and major holidays. You have access to a nurse at all times for urgent questions. Please call the main number to the clinic [phone] and follow the prompts.  For any non-urgent questions, you may also contact your provider using MyChart. We now offer e-Visits for anyone 55 and older to request care online for non-urgent symptoms. For details visit [email or web address].   Also download the MyChart app! Go to the app store, search "MyChart", open the app, select [facility], and log in with your MyChart username and password.  Due to Covid, a mask is required upon entering the hospital/clinic. If you do not have a mask, one will be given to you upon arrival. For doctor visits, patients may have 1 support person aged 53 or older with them. For treatment visits, patients cannot have anyone with them due to current Covid guidelines and our immunocompromised population.

## 2020-12-25 NOTE — Progress Notes (Signed)
Chaplain engaged in a follow-up visit with Savannah Burns.  Chaplain was able to provide her with a number given by social work to help her find some help around her house and yard.    Chaplain offered listening and support as Savannah Burns shared about her week, granddaughter, and past relationships.    12/25/20 1000  Clinical Encounter Type  Visited With Patient  Visit Type Follow-up

## 2020-12-25 NOTE — Progress Notes (Signed)
Patient presents today for Venofer infusion per providers order.  Vital signs WNL.  Patient has no new complaints at this time.  Peripheral IV started and blood return noted pre and post infusion.  Venofer infusion given today per MD orders.  Stable during infusion without adverse affects.  Vital signs stable.  No complaints at this time.  Discharge from clinic ambulatory in stable condition.  Alert and oriented X 3.  Follow up with [facility] as scheduled.

## 2020-12-28 ENCOUNTER — Telehealth: Payer: Self-pay

## 2020-12-28 NOTE — Telephone Encounter (Signed)
I have already sent this to [facility] as well. This pt is wanting to change doctors because we haven't figured out why she keeps bleeding internally. Please advise:   Note This pt wants to change Dr's. She stated she had spoke to a PA at the hospital whose last name is Wellsite geologist. Well the pt advised me that she has had 3 colonoscopies and endoscopy's by Dr. Abbey Chatters and he has not found out the reason for her internal bleeding. She asked about another doctor here who does procedures and I advised her Dr. Gala Romney but I have to put a note to my office manager to see what protocol we follow. Advised the pt we will call her once I know more.

## 2020-12-28 NOTE — Telephone Encounter (Signed)
This pt wants to change Dr's. She stated she had spoke to a PA at the hospital whose last name is Wellsite geologist. Well the pt advised me that she has had 3 colonoscopies and endoscopy's by Dr. Abbey Chatters and he has not found out the reason for her internal bleeding. She asked about another doctor here who does procedures and I advised her Dr. Gala Romney but I have to put a note to my office manager to see what protocol we follow. Advised the pt we will call her once I know more.

## 2020-12-29 NOTE — Telephone Encounter (Signed)
Noted, forwarded to them on yesterday

## 2020-12-30 ENCOUNTER — Telehealth: Payer: Medicare PPO

## 2020-12-31 DIAGNOSIS — Z79899 Other long term (current) drug therapy: Secondary | ICD-10-CM | POA: Diagnosis not present

## 2020-12-31 DIAGNOSIS — F1120 Opioid dependence, uncomplicated: Secondary | ICD-10-CM | POA: Diagnosis not present

## 2020-12-31 DIAGNOSIS — Z6841 Body Mass Index (BMI) 40.0 and over, adult: Secondary | ICD-10-CM | POA: Diagnosis not present

## 2020-12-31 DIAGNOSIS — E119 Type 2 diabetes mellitus without complications: Secondary | ICD-10-CM | POA: Diagnosis not present

## 2020-12-31 DIAGNOSIS — G894 Chronic pain syndrome: Secondary | ICD-10-CM | POA: Diagnosis not present

## 2020-12-31 DIAGNOSIS — M5136 Other intervertebral disc degeneration, lumbar region: Secondary | ICD-10-CM | POA: Diagnosis not present

## 2020-12-31 NOTE — Telephone Encounter (Signed)
Noted   Phoned and LMOVM of the pt to return call

## 2021-01-01 ENCOUNTER — Ambulatory Visit: Payer: Medicare PPO | Admitting: Pharmacist

## 2021-01-01 ENCOUNTER — Inpatient Hospital Stay (HOSPITAL_COMMUNITY): Payer: Medicare PPO | Attending: Hematology

## 2021-01-01 DIAGNOSIS — R531 Weakness: Secondary | ICD-10-CM | POA: Insufficient documentation

## 2021-01-01 DIAGNOSIS — D509 Iron deficiency anemia, unspecified: Secondary | ICD-10-CM | POA: Diagnosis not present

## 2021-01-01 DIAGNOSIS — D500 Iron deficiency anemia secondary to blood loss (chronic): Secondary | ICD-10-CM

## 2021-01-01 DIAGNOSIS — C9000 Multiple myeloma not having achieved remission: Secondary | ICD-10-CM | POA: Diagnosis not present

## 2021-01-01 DIAGNOSIS — R5383 Other fatigue: Secondary | ICD-10-CM | POA: Insufficient documentation

## 2021-01-01 DIAGNOSIS — D696 Thrombocytopenia, unspecified: Secondary | ICD-10-CM | POA: Insufficient documentation

## 2021-01-01 DIAGNOSIS — E538 Deficiency of other specified B group vitamins: Secondary | ICD-10-CM | POA: Diagnosis not present

## 2021-01-01 DIAGNOSIS — E1169 Type 2 diabetes mellitus with other specified complication: Secondary | ICD-10-CM

## 2021-01-01 LAB — SAMPLE TO BLOOD BANK

## 2021-01-01 NOTE — Progress Notes (Signed)
  Care Management   Follow Up Note   01/01/2021 Name: Savannah Burns MRN: [id number] DOB: [birth year]   Referred by: [name], DO Reason for referral : Chronic Care Management ((Unsuccessful outreach))   A second unsuccessful telephone outreach was attempted today. The patient was referred to the case management team for assistance with care management and care coordination.   Follow Up Plan: No further follow up required:    SIGNATURE Regina Eck, PharmD, BCPS Clinical Pharmacist, [place]  II Phone [phone]

## 2021-01-04 ENCOUNTER — Encounter (HOSPITAL_COMMUNITY): Payer: Self-pay

## 2021-01-04 NOTE — Telephone Encounter (Signed)
Pt returned my call and I advised of the 2 open spots that were available to her. She stated she has no transportation so she cannot do tomorrow so I offered the next spot with Dr. Abbey Chatters and she advised me that she was not going to see him because everytime he does a colonoscopy he finds polyps and she is not agreeing with the report of his findings. She stated that's why she wanted another Dr in here for a second opinion. The pt advised that since we cannot do that she will find another GI office to go to.

## 2021-01-04 NOTE — Telephone Encounter (Signed)
Attempted to reach the pt again, LMOVM for the pt to return call.

## 2021-01-05 ENCOUNTER — Telehealth (HOSPITAL_COMMUNITY): Payer: Self-pay | Admitting: *Deleted

## 2021-01-05 NOTE — Telephone Encounter (Signed)
Received message from patient inquiring as to why she could not visualize her CBC result in my chart from 01/01/2021.  Reached out to lab to verify that it was not ran and could not be ran today, as it was outside the 48 hour window.  Patient made aware and offered another draw prior to her scheduled draw on Friday 10/14.  States that she will have done that day with her visit with Tarri Abernethy.

## 2021-01-05 NOTE — Telephone Encounter (Signed)
Savannah Burns I phoned and offered her this appt with Cyril Mourning and she refused it. If it is not a doctor with MD behind their name she is refusing it. I looked at notes from yesterday she has contacted the cancer center wanting them to refer her to another Dr.

## 2021-01-05 NOTE — Telephone Encounter (Signed)
noted 

## 2021-01-06 ENCOUNTER — Telehealth: Payer: Self-pay

## 2021-01-06 NOTE — Chronic Care Management (AMB) (Signed)
  Care Management   Note  01/06/2021 Name: Savannah Burns MRN: [id number] DOB: Apr 14, 1950  Savannah Burns is a 70 y.o. year old female who is a primary care patient of [name], DO and is actively engaged with the care management team. I reached out to [name] by phone today to assist with re-scheduling a follow up visit with the RN Case Manager  Follow up plan: Unsuccessful telephone outreach attempt made. A HIPAA compliant phone message was left for the patient providing contact information and requesting a return call.  The care management team will reach out to the patient again over the next 7 days.  If patient returns call to provider office, please advise to call [facility]  at [address], [address], [place], [place] [address] Direct Dial: [phone] [email or web address]: [email or web address]

## 2021-01-07 NOTE — Progress Notes (Signed)
Virtual Visit via Telephone Note [facility]  I connected with Savannah Burns  on 01/08/21  at 12:59 PM  by telephone and verified that I am speaking with the correct person using two identifiers.  Location: Patient: Home Provider: [facility]   I discussed the limitations, risks, security and privacy concerns of performing an evaluation and management service by telephone and the availability of in person appointments. I also discussed with the patient that there may be a patient responsible charge related to this service. The patient expressed understanding and agreed to proceed.   HISTORY OF PRESENT ILLNESS: Savannah Burns follows at our clinic for iron deficiency anemia and smoldering myeloma.  Her smoldering myeloma was addressed at her last visit on 12/11/2020 with Tarri Abernethy PA-C.  She is contacted today for telephone visit for follow-up of her iron deficiency anemia.  At her last visit, she was noted to have Hgb that dropped to 7.3 in conjunction with 2 to 3-week history of dark stools and symptoms of fatigue, lightheadedness, and dyspnea on exertion.  She was transfused 1 unit PRBC on 12/11/2020 and received Venofer 1000 mg in 3 divided doses, last dose given on 12/25/2020.  At today's visit, she reports feeling improved after her IV iron infusions.  She reports that her symptoms of melena and dark stool have resolved.  She does not note any other signs of bleeding such as epistaxis, hematemesis, hematochezia, or hematuria.  She is much less fatigued and denies any current symptoms of pica or restless legs.  No chest pain or dyspnea on exertion apart from what is usual given her underlying congestive heart failure.  She denies any lightheadedness or syncope.  She has not yet followed up with gastroenterology and requests a referral to a new GI doctor.  She reports 75% energy and 100% appetite.  She reports that she is maintaining a stable  weight.    OBSERVATIONS/OBJECTIVE: Review of Systems  Constitutional:  Positive for malaise/fatigue (Mild, energy 75%). Negative for chills, diaphoresis, fever and weight loss.  HENT:  Positive for sore throat (Reports that she is "constantly clearing her throat" and has some trouble swallowing).   Respiratory:  Negative for cough and shortness of breath.   Cardiovascular:  Negative for chest pain and palpitations.  Gastrointestinal:  Negative for abdominal pain, blood in stool, melena, nausea and vomiting.  Neurological:  Negative for dizziness and headaches.    PHYSICAL EXAM (per limitations of virtual telephone visit): The patient is alert and oriented x 3, exhibiting adequate mentation, good mood, and ability to speak in full sentences and execute sound judgement.   ASSESSMENT & PLAN: 1.  Iron deficiency anemia in the setting of GI blood loss - She has had three hospital admissions for GI bleeding in the past year (October 2021, December 2021, February 2022) - Hgb was as low as 3.7 (01/22/2020), and she has had 10+ blood transfusions in the past year.  Prior to GI bleeding in October 2021, her hemoglobin was within normal range. - Most recently, she was noted to have Hgb that dropped to 7.3 in conjunction with 2 to 3-week history of dark stools and symptoms of fatigue, lightheadedness, and dyspnea on exertion.  She was transfused 1 unit PRBC on 12/11/2020 and received Venofer 1000 mg in 3 divided doses, last dose given on 12/25/2020. - Extensive endoscopic work-up, but source of chronic anemia has yet to be identified.   Last EGD (07/28/2020) showed  grade 1 esophageal varices and gastritis, as well as prominent blood vessels at the base of the tongue and underneath the tongue (question lingual varicosities) Capsule endoscopy (07/28/2020) did not show any obvious lesions, erosions, or overt GI bleeding Last colonoscopy (January 2022) showed nonbleeding internal hemorrhoids, diverticulosis in the  sigmoid and descending colon, and polypectomy. She has been referred to ENT to investigate potential lingual varicosities as source of bleeding - Failed to improve with oral iron supplementation and has required several courses of IV iron, most recently on 12/25/2020 - Most recent CBC (01/08/2021): Hemoglobin improved to 9.3 - Denies any current melena or bright red blood per rectum - Symptoms are improved after IV iron - PLAN: Recommend IV Venofer 400 mg x 1 dose - Monthly CBC due to high risk for recurrent GI bleeding (with sample to blood bank) - Repeat CBC and iron panel in 2 months (along with myeloma labs) - Per patient request, referral sent for new gastroenterologist (Dr. Bryan Lemma in [place]) - Patient encouraged to follow-up with ENT (Dr. Benjamine Mola) for potential lingual varicosities. - Patient is aware of alarm symptoms that prompt immediate medical attention at the emergency department.  2.  OTHER CLINICAL ISSUES: - Smoldering myeloma, IgG kappa: Stable and addressed at last visit on 12/11/2020.  Myeloma panel due in December 2022. - Thrombocytopenia and leukopenia, secondary to cirrhosis: Stable and addressed at last visit on 12/11/2020. - Vitamin B12 deficiency: Addressed at last visit.  Continue monthly B12 injections.   FOLLOW UP INSTRUCTIONS: - Venofer 400 mg x 1 dose - Continue monthly B12 injections - Monthly CBC - Repeat lab panel in 2 months (CBC/iron panel, myeloma panel) - Office visit the week after labs - Referral sent to Dr. Benjamine Mola (ENT) and Dr. Bryan Lemma (gastroenterology)   I discussed the assessment and treatment plan with the patient. The patient was provided an opportunity to ask questions and all were answered. The patient agreed with the plan and demonstrated an understanding of the instructions.   The patient was advised to call back or seek an in-person evaluation if the symptoms worsen or if the condition fails to improve as anticipated.  I provided 13  minutes of non-face-to-face time during this encounter.   Harriett Rush, PA-C 01/08/2021 5:06 PM

## 2021-01-08 ENCOUNTER — Inpatient Hospital Stay (HOSPITAL_COMMUNITY): Payer: Medicare PPO

## 2021-01-08 ENCOUNTER — Inpatient Hospital Stay (HOSPITAL_BASED_OUTPATIENT_CLINIC_OR_DEPARTMENT_OTHER): Payer: Medicare PPO | Admitting: Physician Assistant

## 2021-01-08 ENCOUNTER — Encounter (HOSPITAL_COMMUNITY): Payer: Self-pay | Admitting: *Deleted

## 2021-01-08 ENCOUNTER — Other Ambulatory Visit: Payer: Self-pay

## 2021-01-08 DIAGNOSIS — E538 Deficiency of other specified B group vitamins: Secondary | ICD-10-CM

## 2021-01-08 DIAGNOSIS — R531 Weakness: Secondary | ICD-10-CM | POA: Diagnosis not present

## 2021-01-08 DIAGNOSIS — D472 Monoclonal gammopathy: Secondary | ICD-10-CM

## 2021-01-08 DIAGNOSIS — D500 Iron deficiency anemia secondary to blood loss (chronic): Secondary | ICD-10-CM

## 2021-01-08 DIAGNOSIS — D509 Iron deficiency anemia, unspecified: Secondary | ICD-10-CM | POA: Diagnosis not present

## 2021-01-08 DIAGNOSIS — R5383 Other fatigue: Secondary | ICD-10-CM | POA: Diagnosis not present

## 2021-01-08 DIAGNOSIS — D696 Thrombocytopenia, unspecified: Secondary | ICD-10-CM | POA: Diagnosis not present

## 2021-01-08 DIAGNOSIS — C9000 Multiple myeloma not having achieved remission: Secondary | ICD-10-CM | POA: Diagnosis not present

## 2021-01-08 LAB — CBC WITH DIFFERENTIAL/PLATELET
Abs Immature Granulocytes: 0.01 10*3/uL (ref 0.00–0.07)
Basophils Absolute: 0 10*3/uL (ref 0.0–0.1)
Basophils Relative: 0 %
Eosinophils Absolute: 0.1 10*3/uL (ref 0.0–0.5)
Eosinophils Relative: 1 %
HCT: 32.3 % — ABNORMAL LOW (ref 36.0–46.0)
Hemoglobin: 9.3 g/dL — ABNORMAL LOW (ref 12.0–15.0)
Immature Granulocytes: 0 %
Lymphocytes Relative: 29 %
Lymphs Abs: 1.1 10*3/uL (ref 0.7–4.0)
MCH: 28.4 pg (ref 26.0–34.0)
MCHC: 28.8 g/dL — ABNORMAL LOW (ref 30.0–36.0)
MCV: 98.5 fL (ref 80.0–100.0)
Monocytes Absolute: 0.4 10*3/uL (ref 0.1–1.0)
Monocytes Relative: 11 %
Neutro Abs: 2.2 10*3/uL (ref 1.7–7.7)
Neutrophils Relative %: 59 %
Platelets: 96 10*3/uL — ABNORMAL LOW (ref 150–400)
RBC: 3.28 MIL/uL — ABNORMAL LOW (ref 3.87–5.11)
RDW: 21.5 % — ABNORMAL HIGH (ref 11.5–15.5)
WBC: 3.8 10*3/uL — ABNORMAL LOW (ref 4.0–10.5)
nRBC: 0 % (ref 0.0–0.2)

## 2021-01-08 LAB — SAMPLE TO BLOOD BANK

## 2021-01-11 ENCOUNTER — Encounter (HOSPITAL_COMMUNITY): Payer: Self-pay | Admitting: Lab

## 2021-01-11 NOTE — Progress Notes (Unsigned)
Referral sent to Dr Benjamine Mola,  records faxed on 10/17

## 2021-01-13 ENCOUNTER — Other Ambulatory Visit: Payer: Self-pay

## 2021-01-13 ENCOUNTER — Inpatient Hospital Stay (HOSPITAL_COMMUNITY): Payer: Medicare PPO

## 2021-01-13 VITALS — BP 115/82 | HR 76 | Temp 98.3°F | Resp 20

## 2021-01-13 DIAGNOSIS — D509 Iron deficiency anemia, unspecified: Secondary | ICD-10-CM | POA: Diagnosis not present

## 2021-01-13 DIAGNOSIS — C9000 Multiple myeloma not having achieved remission: Secondary | ICD-10-CM | POA: Diagnosis not present

## 2021-01-13 DIAGNOSIS — D696 Thrombocytopenia, unspecified: Secondary | ICD-10-CM | POA: Diagnosis not present

## 2021-01-13 DIAGNOSIS — E538 Deficiency of other specified B group vitamins: Secondary | ICD-10-CM | POA: Diagnosis not present

## 2021-01-13 DIAGNOSIS — R5383 Other fatigue: Secondary | ICD-10-CM | POA: Diagnosis not present

## 2021-01-13 DIAGNOSIS — R531 Weakness: Secondary | ICD-10-CM | POA: Diagnosis not present

## 2021-01-13 DIAGNOSIS — D500 Iron deficiency anemia secondary to blood loss (chronic): Secondary | ICD-10-CM

## 2021-01-13 MED ORDER — SODIUM CHLORIDE 0.9 % IV SOLN
400.0000 mg | Freq: Once | INTRAVENOUS | Status: AC
  Administered 2021-01-13: 400 mg via INTRAVENOUS
  Filled 2021-01-13: qty 20

## 2021-01-13 MED ORDER — LORATADINE 10 MG PO TABS
10.0000 mg | ORAL_TABLET | Freq: Once | ORAL | Status: AC
  Administered 2021-01-13: 10 mg via ORAL
  Filled 2021-01-13: qty 1

## 2021-01-13 MED ORDER — SODIUM CHLORIDE 0.9 % IV SOLN: Freq: Once | INTRAVENOUS | Status: AC

## 2021-01-13 MED ORDER — CYANOCOBALAMIN 1000 MCG/ML IJ SOLN
1000.0000 ug | Freq: Once | INTRAMUSCULAR | Status: AC
  Administered 2021-01-13: 1000 ug via INTRAMUSCULAR
  Filled 2021-01-13: qty 1

## 2021-01-13 NOTE — Patient Instructions (Signed)
[facility]  Discharge Instructions: Thank you for choosing [facility] to provide your oncology and hematology care.  If you have a lab appointment with the [facility], please come in thru the Main Entrance and check in at the main information desk.  Wear comfortable clothing and clothing appropriate for easy access to any Portacath or PICC line.   We strive to give you quality time with your provider. You may need to reschedule your appointment if you arrive late (15 or more minutes).  Arriving late affects you and other patients whose appointments are after yours.  Also, if you miss three or more appointments without notifying the office, you may be dismissed from the clinic at the provider's discretion.      For prescription refill requests, have your pharmacy contact our office and allow 72 hours for refills to be completed.    Today you received the following chemotherapy and/or immunotherapy agents Venofer      To help prevent nausea and vomiting after your treatment, we encourage you to take your nausea medication as directed.  BELOW ARE SYMPTOMS THAT SHOULD BE REPORTED IMMEDIATELY: *FEVER GREATER THAN 100.4 F (38 C) OR HIGHER *CHILLS OR SWEATING *NAUSEA AND VOMITING THAT IS NOT CONTROLLED WITH YOUR NAUSEA MEDICATION *UNUSUAL SHORTNESS OF BREATH *UNUSUAL BRUISING OR BLEEDING *URINARY PROBLEMS (pain or burning when urinating, or frequent urination) *BOWEL PROBLEMS (unusual diarrhea, constipation, pain near the anus) TENDERNESS IN MOUTH AND THROAT WITH OR WITHOUT PRESENCE OF ULCERS (sore throat, sores in mouth, or a toothache) UNUSUAL RASH, SWELLING OR PAIN  UNUSUAL VAGINAL DISCHARGE OR ITCHING   Items with * indicate a potential emergency and should be followed up as soon as possible or go to the Emergency Department if any problems should occur.  Please show the CHEMOTHERAPY ALERT CARD or IMMUNOTHERAPY ALERT CARD at check-in to the Emergency  Department and triage nurse.  Should you have questions after your visit or need to cancel or reschedule your appointment, please contact [facility] [phone]  and follow the prompts.  Office hours are 8:00 a.m. to 4:30 p.m. Monday - Friday. Please note that voicemails left after 4:00 p.m. may not be returned until the following business day.  We are closed weekends and major holidays. You have access to a nurse at all times for urgent questions. Please call the main number to the clinic [phone] and follow the prompts.  For any non-urgent questions, you may also contact your provider using MyChart. We now offer e-Visits for anyone 71 and older to request care online for non-urgent symptoms. For details visit [email or web address].   Also download the MyChart app! Go to the app store, search "MyChart", open the app, select [facility], and log in with your MyChart username and password.  Due to Covid, a mask is required upon entering the hospital/clinic. If you do not have a mask, one will be given to you upon arrival. For doctor visits, patients may have 1 support person aged 71 or older with them. For treatment visits, patients cannot have anyone with them due to current Covid guidelines and our immunocompromised population.

## 2021-01-13 NOTE — Progress Notes (Signed)
Patient presents today for Venofer infusion per providers order.  Vital signs WNL.  Patient has no new complaints at this time.    Peripheral IV started and blood return noted pre and post infusion.  Venofer given today per MD orders.  Stable during infusion without adverse affects.  Vital signs stable.  No complaints at this time.  Discharge from clinic via wheelchair in stable condition.  Alert and oriented X 3.  Follow up with [facility] as scheduled.

## 2021-01-14 ENCOUNTER — Telehealth: Payer: Medicare PPO

## 2021-01-14 ENCOUNTER — Telehealth: Payer: Self-pay | Admitting: Family Medicine

## 2021-01-15 NOTE — Telephone Encounter (Signed)
I spoke to [name] about this after our last visit.  The specialist recommended POC glucose monitoring AND has given me an additional lab to collect the next visit I have with Savannah Burns.  She needs to be monitoring sugar at home.

## 2021-01-15 NOTE — Telephone Encounter (Signed)
Lmtcb.

## 2021-01-18 ENCOUNTER — Ambulatory Visit (HOSPITAL_COMMUNITY): Payer: Medicare PPO

## 2021-01-19 NOTE — Telephone Encounter (Signed)
noted 

## 2021-01-21 ENCOUNTER — Telehealth: Payer: Self-pay | Admitting: *Deleted

## 2021-01-21 ENCOUNTER — Telehealth: Payer: Medicare PPO | Admitting: *Deleted

## 2021-01-21 NOTE — Telephone Encounter (Signed)
  Care Management   Follow Up Note   01/21/2021 Name: Savannah Burns MRN: [id number] DOB: 1950/06/18   Referred by: [name], DO Reason for referral : Chronic Care Management (Unsuccessful outreach)   Third unsuccessful telephone outreach was attempted today. The patient was referred to the case management team for assistance with care management and care coordination. The patient's primary care provider has been notified of our unsuccessful attempts to make or maintain contact with the patient. The care management team is pleased to engage with this patient at any time in the future should he/she be interested in assistance from the care management team.   Follow Up Plan: No further follow up required  [name], BSN, RN-BC [facility] Management Direct Dial: [phone]

## 2021-01-22 ENCOUNTER — Telehealth: Payer: Self-pay | Admitting: Gastroenterology

## 2021-01-22 NOTE — Telephone Encounter (Signed)
Good afternoon Dr. Tarri Glenn, we received a referral where patient is seeking a second opinion for GI bleed.  She is currently a patient at [facility].  Records are in Epic.  Can you please review and advise on scheduling?  Thank you.

## 2021-01-23 DIAGNOSIS — J449 Chronic obstructive pulmonary disease, unspecified: Secondary | ICD-10-CM | POA: Diagnosis not present

## 2021-01-25 NOTE — Telephone Encounter (Signed)
Spoke with patient and informed due to high demand from patients without established GI providers we cannot accommodate this transfer.  Patient then hung up the phone.

## 2021-01-27 ENCOUNTER — Ambulatory Visit (INDEPENDENT_AMBULATORY_CARE_PROVIDER_SITE_OTHER): Payer: Medicare PPO | Admitting: Licensed Clinical Social Worker

## 2021-01-27 DIAGNOSIS — F339 Major depressive disorder, recurrent, unspecified: Secondary | ICD-10-CM

## 2021-01-27 DIAGNOSIS — E1159 Type 2 diabetes mellitus with other circulatory complications: Secondary | ICD-10-CM

## 2021-01-27 DIAGNOSIS — E66813 Obesity, class 3: Secondary | ICD-10-CM

## 2021-01-27 DIAGNOSIS — D500 Iron deficiency anemia secondary to blood loss (chronic): Secondary | ICD-10-CM

## 2021-01-27 DIAGNOSIS — J449 Chronic obstructive pulmonary disease, unspecified: Secondary | ICD-10-CM

## 2021-01-27 DIAGNOSIS — E785 Hyperlipidemia, unspecified: Secondary | ICD-10-CM

## 2021-01-27 DIAGNOSIS — E1169 Type 2 diabetes mellitus with other specified complication: Secondary | ICD-10-CM

## 2021-01-27 DIAGNOSIS — I152 Hypertension secondary to endocrine disorders: Secondary | ICD-10-CM

## 2021-01-27 DIAGNOSIS — I5032 Chronic diastolic (congestive) heart failure: Secondary | ICD-10-CM

## 2021-01-27 NOTE — Patient Instructions (Addendum)
Visit Information  Patient Goal: Protect My Health (Patient) Manage Depression issues. Manage anxiety issues. Manage in home care needs  Timeframe:  Short-Term Goal Priority:  High Progress: Not On Track Start Date:       01/27/21              Expected End Date:      04/26/21           Follow Up Date  03/25/21 at 10:00 AM   Protect My Health (Patient) Manage Depression issues . Manage anxiety issues faced; Manage in home care needs   Why is this important?   Screening tests can find diseases early when they are easier to treat.  Your doctor or nurse will talk with you about which tests are important for you.  Getting shots for common diseases like the flu and shingles will help prevent them.     Patient Self Care Activities:  Self administers medications as prescribed Attends all scheduled provider appointments Performs ADL's independently  Patient Coping Strengths:  [place] friends  Patient Self Care Deficits:  Depression issues Mobility issues Low energy  Patient Goals:  - spend time or talk with others at least 2 to 3 times per week - practice relaxation or meditation daily - keep a calendar with appointment dates  Follow Up Plan: LCSW to call client on 03/25/21 at 10:00 AM to assess client needs   [name].Piers Baade MSW, LCSW Licensed Clinical Social Worker [facility] Care Management [phone]

## 2021-01-27 NOTE — Chronic Care Management (AMB) (Signed)
Chronic Care Management    Clinical Social Work Note  01/27/2021 Name: Savannah Burns MRN: [id number] DOB: Jan 12, 1951  Savannah Burns is a 70 y.o. year old female who is a primary care patient of [name], DO. The CCM team was consulted to assist the patient with chronic disease management and/or care coordination needs related to: [employer] .   Engaged with patient by telephone for follow up visit in response to provider referral for social work chronic care management and care coordination services.   Consent to Services:  The patient was given information about Chronic Care Management services, agreed to services, and gave verbal consent prior to initiation of services.  Please see initial visit note for detailed documentation.   Patient agreed to services and consent obtained.   Assessment: Review of patient past medical history, allergies, medications, and health status, including review of relevant consultants reports was performed today as part of a comprehensive evaluation and provision of chronic care management and care coordination services.     SDOH (Social Determinants of Health) assessments and interventions performed:  SDOH Interventions    Flowsheet Row Most Recent Value  SDOH Interventions   Physical Activity Interventions Other (Comments)  [client uses a walker to help her ambulate]  Stress Interventions Provide Counseling  [client has stress related to family conflict issues,  client has stress related managing her in home care needs]  Depression Interventions/Treatment  Medication        Advanced Directives Status: See Vynca application for related entries.  CCM Care Plan  Allergies  Allergen Reactions   Keflex [Cephalexin] Nausea And Vomiting    Outpatient Encounter Medications as of 01/27/2021  Medication Sig Note   Blood Glucose Calibration (GLUCOMETER DEX HIGH CONTROL) LIQD Test CBG's once daily.     Blood Glucose Monitoring  Suppl (TRUE METRIX AIR GLUCOSE METER) w/Device KIT Check BS daily Dx E11.9    Cholecalciferol (VITAMIN D) 50 MCG (2000 UT) tablet Take 4,000 Units by mouth daily.    erythromycin ophthalmic ointment Place 1 application into the left eye at bedtime.    escitalopram (LEXAPRO) 10 MG tablet Take 1 tablet (10 mg total) by mouth daily.    esomeprazole (NEXIUM) 20 MG capsule Take 1 capsule (20 mg total) by mouth daily at 12 noon.    ferrous sulfate 325 (65 FE) MG tablet Take 1 tablet (325 mg total) by mouth 2 (two) times daily with a meal. (Patient taking differently: Take 325 mg by mouth daily with breakfast.)    furosemide (LASIX) 20 MG tablet Take 20 mg by mouth. bid    glucose blood (TRUE METRIX BLOOD GLUCOSE TEST) test strip Check BS daily Dx E11.9    lisinopril (ZESTRIL) 10 MG tablet Take 1 tablet by mouth once daily (Patient taking differently: Take 10 mg by mouth daily.)    lovastatin (MEVACOR) 20 MG tablet Take 1 tablet (20 mg total) by mouth at bedtime.    metFORMIN (GLUCOPHAGE) 1000 MG tablet Take 1 tablet (1,000 mg total) by mouth daily with breakfast.    nadolol (CORGARD) 20 MG tablet Take 1 tablet (20 mg total) by mouth daily. 08/04/2020: No longer taking   nystatin cream (MYCOSTATIN) Apply 1 application topically 2 (two) times daily. X14 days or until rash resolved    oxyCODONE-acetaminophen (PERCOCET) 10-325 MG tablet Take 1 tablet by mouth every 6 (six) hours as needed for pain.    OXYGEN Inhale 3 L into the lungs continuous.  potassium chloride SA (KLOR-CON) 20 MEQ tablet Take 2 tablets (40 mEq total) by mouth 2 (two) times daily. (Patient taking differently: Take 40 mEq by mouth daily.)    torsemide (DEMADEX) 20 MG tablet Take 3 tablets (60 mg total) by mouth 2 (two) times daily. (Patient taking differently: Take 50 mg by mouth 2 (two) times daily.)    TRUEplus Lancets 30G MISC Check BS daily Dx E11.9    vitamin B-12 (CYANOCOBALAMIN) 1000 MCG tablet Take 2,000 mcg by mouth daily.     [DISCONTINUED] albuterol (PROVENTIL HFA;VENTOLIN HFA) 108 (90 Base) MCG/ACT inhaler Inhale 2 puffs into the lungs every 6 (six) hours as needed for wheezing or shortness of breath.    No facility-administered encounter medications on file as of 01/27/2021.    Patient Active Problem List   Diagnosis Date Noted   Hypoalbuminemia 05/17/2020   Moderate protein malnutrition ([place]) 05/17/2020   Melena 05/16/2020   Hyperlipidemia    Hypokalemia    GI bleed 03/20/2020   Lower GI bleed 03/19/2020   Acute on chronic congestive heart failure (HCC)    Symptomatic anemia    Iron deficiency anemia    Heme positive stool    ABLA (acute blood loss anemia) 01/22/2020   Supplemental oxygen dependent 10/22/2019   Chronic dyspnea 10/22/2019   Hypertension associated with diabetes ([place]) 10/22/2019   Bilateral hand pain 09/21/2016   Fatigue 07/30/2015   Chronic diastolic HF (heart failure) ([place]) 06/29/2015   Postmenopausal bleeding 04/17/2015   Excessive daytime sleepiness 12/19/2014   Swelling of lower extremity 11/27/2014   Back pain 06/13/2013   Sinusitis, chronic 05/30/2012   Allergic rhinitis 06/06/2011   Hyperlipidemia associated with type 2 diabetes mellitus ([place]) 03/24/2009   Diabetes mellitus, type 2 ([place]) 08/12/2008   Pulmonary nodule 08/29/2007   COPD (chronic obstructive pulmonary disease) ([place]) 02/14/2007   Class 3 obesity ([place]) 05/25/2006   TOBACCO DEPENDENCE 05/25/2006   Depression, recurrent ([place]) 05/25/2006   HYPERTENSION, BENIGN SYSTEMIC 05/25/2006   DJD, UNSPECIFIED 05/25/2006    Conditions to be addressed/monitored: monitor client management of depression issues and of anxiety issues  Care Plan:  LCSW Care Plan  Start Date:  01/27/21 End Date:  04/26/21 Priority:  High Progress: Not On Track Previous Progress:  On Track   Current barriers:   Patient in need of assistance with connecting to community resources for possible in home care support Mobility  issues Depression issues Financial issues  Clinical Goals:  LCSW to communicate with client in next 30 days to discuss financial needs of client LCSW to communicate with client in next 30 days to discuss in home care needs of client LCSW to call client in next 30 days to discuss depression issues of client Client to call RNCM as needed in next 30 days for CCM nursing support  Clinical Interventions:  Collaboration with [name], DO regarding development and update of comprehensive plan of care as evidenced by provider attestation and co-signature Assessment of needs, barriers of client Discussed with [name] the in home care needs of client. Client had been receiving in home support from her granddaughter, Ishmael Holter. [name] said that this past Saturday her granddaughter moved back to home of her mother.  [name] is very anxious about this situation and is hoping Ishmael Holter  will return soon to help with [place]'s in home care needs Discussed transport resources with [name].Marland Kitchen LCSW talked with client about [facility] support and signing up for [facility] support.  LCSW gave [name] the phone number  for [facility]. Discussed her insurance coverage with [employer]. Encouraged client to call her insurance carrier and talk with representative about possible transport help through her insurance. Reviewed food provision for client. She said she had adequate food supply Reviewed medication procurement with client Reviewed with client her energy level. She said that her energy is sometimes low Discussed iron infusions with client.  She said that her hemoglobin level had been a little more steady recently and she is glad about this. Provided counseling support for client Discussed Medicaid application process with client Discussed edema of client. She said she was having some swelling in her legs. Discussed financial needs of client. She said she gets Social Security benefit each month and gets a small  pension benefit each month Encouraged client to call RNCM as needed for CCM nursing support  Patient Coping Skills/Strengths: Patient completes most ADLs independently Patient has her prescribed medications Patient has adequate food at this time  Patient Deficits: Walking challenges Financial challenges In home care needs at present  Patient Goals: In next 30 days, client will: Attend scheduled medical appointments Communicate as needed with LCSW about in home care needs and about depression management Communicate as needed with [facility] for CCM nursing support -  Follow Up Plan: LCSW to call client on 03/25/21 at 10:00 AM to assess client needs  [name].Burleigh Brockmann MSW, LCSW Licensed Clinical Social Worker [facility] Care Management [phone]

## 2021-02-03 DIAGNOSIS — Z0000 Encounter for general adult medical examination without abnormal findings: Secondary | ICD-10-CM | POA: Diagnosis not present

## 2021-02-03 DIAGNOSIS — G894 Chronic pain syndrome: Secondary | ICD-10-CM | POA: Diagnosis not present

## 2021-02-03 DIAGNOSIS — Z79899 Other long term (current) drug therapy: Secondary | ICD-10-CM | POA: Diagnosis not present

## 2021-02-03 DIAGNOSIS — M5136 Other intervertebral disc degeneration, lumbar region: Secondary | ICD-10-CM | POA: Diagnosis not present

## 2021-02-03 DIAGNOSIS — Z6841 Body Mass Index (BMI) 40.0 and over, adult: Secondary | ICD-10-CM | POA: Diagnosis not present

## 2021-02-05 NOTE — Chronic Care Management (AMB) (Signed)
  Care Management   Note  02/05/2021 Name: Savannah Burns MRN: [id number] DOB: [birth year]  Savannah Burns is a 70 y.o. year old female who is a primary care patient of [name], DO and is actively engaged with the care management team. I reached out to [name] by phone today to assist with re-scheduling a follow up visit with the RN Case Manager  Follow up plan: Unsuccessful telephone outreach attempt made. A HIPAA compliant phone message was left for the patient providing contact information and requesting a return call.  The care management team will reach out to the patient again over the next 7 days.  If patient returns call to provider office, please advise to call [facility]  at [place], [place], [place], [place] [address] Direct Dial: [phone] [email or web address]

## 2021-02-10 ENCOUNTER — Telehealth: Payer: Self-pay | Admitting: Family Medicine

## 2021-02-10 NOTE — Telephone Encounter (Signed)
Please call patient to schedule an appt with Dr Lajuana Ripple for depression and panic attacks.

## 2021-02-11 NOTE — Chronic Care Management (AMB) (Signed)
  Care Management   Note  02/11/2021 Name: Savannah Burns MRN: [id number] DOB: 01-02-1951  Savannah Burns is a 70 y.o. year old female who is a primary care patient of [name], DO and is actively engaged with the care management team. I reached out to [name] by phone today to assist with re-scheduling a follow up visit with the RN Case Manager  Follow up plan: Telephone appointment with care management team member scheduled for:04/12/2020  Noreene Larsson, [place], [place] [facility]  [place], [place] [address] Direct Dial: [phone] [email or web address]: [email or web address]

## 2021-02-15 ENCOUNTER — Ambulatory Visit: Payer: Medicare PPO | Admitting: Licensed Clinical Social Worker

## 2021-02-15 DIAGNOSIS — D500 Iron deficiency anemia secondary to blood loss (chronic): Secondary | ICD-10-CM

## 2021-02-15 DIAGNOSIS — F339 Major depressive disorder, recurrent, unspecified: Secondary | ICD-10-CM

## 2021-02-15 DIAGNOSIS — J449 Chronic obstructive pulmonary disease, unspecified: Secondary | ICD-10-CM

## 2021-02-15 DIAGNOSIS — I5032 Chronic diastolic (congestive) heart failure: Secondary | ICD-10-CM

## 2021-02-15 DIAGNOSIS — E1169 Type 2 diabetes mellitus with other specified complication: Secondary | ICD-10-CM

## 2021-02-15 DIAGNOSIS — E785 Hyperlipidemia, unspecified: Secondary | ICD-10-CM

## 2021-02-15 DIAGNOSIS — E1159 Type 2 diabetes mellitus with other circulatory complications: Secondary | ICD-10-CM

## 2021-02-15 NOTE — Chronic Care Management (AMB) (Signed)
Chronic Care Management    Clinical Social Work Note  02/15/2021 Name: HALLI EQUIHUA MRN: [id number] DOB: 12/07/1950  KEIMANI LAUFER is a 70 y.o. year old female who is a primary care patient of [name], DO. The CCM team was consulted to assist the patient with chronic disease management and/or care coordination needs related to: [employer] .   Engaged with patient by telephone for follow up visit in response to provider referral for social work chronic care management and care coordination services.   Consent to Services:  The patient was given information about Chronic Care Management services, agreed to services, and gave verbal consent prior to initiation of services.  Please see initial visit note for detailed documentation.   Patient agreed to services and consent obtained.   Assessment: Review of patient past medical history, allergies, medications, and health status, including review of relevant consultants reports was performed today as part of a comprehensive evaluation and provision of chronic care management and care coordination services.     SDOH (Social Determinants of Health) assessments and interventions performed:  SDOH Interventions    Flowsheet Row Most Recent Value  SDOH Interventions   Physical Activity Interventions Other (Comments)  [uses a walker to help her walk]  Stress Interventions Provide Counseling  [client has stress over managing medical needs. she has stress over family conflict issues with her daughter]  Depression Interventions/Treatment  Medication        Advanced Directives Status: See Vynca application for related entries.  CCM Care Plan  Allergies  Allergen Reactions   Keflex [Cephalexin] Nausea And Vomiting    Outpatient Encounter Medications as of 02/15/2021  Medication Sig Note   Blood Glucose Calibration (GLUCOMETER DEX HIGH CONTROL) LIQD Test CBG's once daily.     Blood Glucose Monitoring Suppl (TRUE  METRIX AIR GLUCOSE METER) w/Device KIT Check BS daily Dx E11.9    Cholecalciferol (VITAMIN D) 50 MCG (2000 UT) tablet Take 4,000 Units by mouth daily.    erythromycin ophthalmic ointment Place 1 application into the left eye at bedtime.    escitalopram (LEXAPRO) 10 MG tablet Take 1 tablet (10 mg total) by mouth daily.    esomeprazole (NEXIUM) 20 MG capsule Take 1 capsule (20 mg total) by mouth daily at 12 noon.    ferrous sulfate 325 (65 FE) MG tablet Take 1 tablet (325 mg total) by mouth 2 (two) times daily with a meal. (Patient taking differently: Take 325 mg by mouth daily with breakfast.)    furosemide (LASIX) 20 MG tablet Take 20 mg by mouth. bid    glucose blood (TRUE METRIX BLOOD GLUCOSE TEST) test strip Check BS daily Dx E11.9    lisinopril (ZESTRIL) 10 MG tablet Take 1 tablet by mouth once daily (Patient taking differently: Take 10 mg by mouth daily.)    lovastatin (MEVACOR) 20 MG tablet Take 1 tablet (20 mg total) by mouth at bedtime.    metFORMIN (GLUCOPHAGE) 1000 MG tablet Take 1 tablet (1,000 mg total) by mouth daily with breakfast.    nadolol (CORGARD) 20 MG tablet Take 1 tablet (20 mg total) by mouth daily. 08/04/2020: No longer taking   nystatin cream (MYCOSTATIN) Apply 1 application topically 2 (two) times daily. X14 days or until rash resolved    oxyCODONE-acetaminophen (PERCOCET) 10-325 MG tablet Take 1 tablet by mouth every 6 (six) hours as needed for pain.    OXYGEN Inhale 3 L into the lungs continuous.    potassium chloride SA (  KLOR-CON) 20 MEQ tablet Take 2 tablets (40 mEq total) by mouth 2 (two) times daily. (Patient taking differently: Take 40 mEq by mouth daily.)    torsemide (DEMADEX) 20 MG tablet Take 3 tablets (60 mg total) by mouth 2 (two) times daily. (Patient taking differently: Take 50 mg by mouth 2 (two) times daily.)    TRUEplus Lancets 30G MISC Check BS daily Dx E11.9    vitamin B-12 (CYANOCOBALAMIN) 1000 MCG tablet Take 2,000 mcg by mouth daily.     [DISCONTINUED] albuterol (PROVENTIL HFA;VENTOLIN HFA) 108 (90 Base) MCG/ACT inhaler Inhale 2 puffs into the lungs every 6 (six) hours as needed for wheezing or shortness of breath.    No facility-administered encounter medications on file as of 02/15/2021.    Patient Active Problem List   Diagnosis Date Noted   Hypoalbuminemia 05/17/2020   Moderate protein malnutrition ([place]) 05/17/2020   Melena 05/16/2020   Hyperlipidemia    Hypokalemia    GI bleed 03/20/2020   Lower GI bleed 03/19/2020   Acute on chronic congestive heart failure (HCC)    Symptomatic anemia    Iron deficiency anemia    Heme positive stool    ABLA (acute blood loss anemia) 01/22/2020   Supplemental oxygen dependent 10/22/2019   Chronic dyspnea 10/22/2019   Hypertension associated with diabetes ([place]) 10/22/2019   Bilateral hand pain 09/21/2016   Fatigue 07/30/2015   Chronic diastolic HF (heart failure) ([place]) 06/29/2015   Postmenopausal bleeding 04/17/2015   Excessive daytime sleepiness 12/19/2014   Swelling of lower extremity 11/27/2014   Back pain 06/13/2013   Sinusitis, chronic 05/30/2012   Allergic rhinitis 06/06/2011   Hyperlipidemia associated with type 2 diabetes mellitus ([place]) 03/24/2009   Diabetes mellitus, type 2 ([place]) 08/12/2008   Pulmonary nodule 08/29/2007   COPD (chronic obstructive pulmonary disease) ([place]) 02/14/2007   Class 3 obesity ([place]) 05/25/2006   TOBACCO DEPENDENCE 05/25/2006   Depression, recurrent ([place]) 05/25/2006   HYPERTENSION, BENIGN SYSTEMIC 05/25/2006   DJD, UNSPECIFIED 05/25/2006    Conditions to be addressed/monitored: monitor client management of depression issues and of anxiety issues  Care Plan : LCSW Care Plan  Updates made by Katha Cabal, LCSW since 02/15/2021 12:00 AM     Problem: Emotional Distress      Goal: Emotional Health Supported. Manage depression issues; [place] anxiety issues   Start Date: 02/15/2021  Expected End Date: 05/17/2021  This Visit's  Progress: On track  Priority: Medium  Note:   Current Barriers:  Mobility issues Pain issues Depression issues Anxiety issues Suicidal Ideation/Homicidal Ideation: No  Clinical Social Work Goal(s):  patient will work with SW monthly by telephone or in person to reduce or manage symptoms related to depression or anxiety issues Patient will communicate with SW monthly to discuss pain issues of client, mobility of client, and in home support for client  Interventions: 1:1 collaboration with [name], DO regarding development and update of comprehensive plan of care as evidenced by provider attestation and co-signature Discussed care needs with client related to appetite, pain, sleeping and medication procurement Discussed in home care support for client. She has some help from her grandson, Gerald Stabs.  He works during the day but helps her at night, when he is off of work Discussed transport needs with client Discussed client potential help from her granddaughter, Ishmael Holter Reviewed iron infusion treatments with client. She said she has been having less iron infusion treatments. She said her energy has been better recently Reviewed standing challenges. She can  stand for a few minutes but then must use seat on her walker to rest. Reviewed financial needs. She said she gets a little financial help from one of her daughters Provided counseling support for client Reviewed ambulation needs. She uses a walker to help her ambulate  Patient Strengths: Has support from grandson, Gerald Stabs Attends scheduled medical appointments Eats adequately  Patient Deficits: Mobility challenges Depression issues Anxiety issues  Patient Goals: In next 30 days, patient will: Attend scheduled medical appointments Receive help in the home as needed from her grandson or from her granddaughter Discuss her anxiety and depression needs with Dr. Lajuana Ripple, PCP  Follow Up Plan: LCSW to call client on 04/16/21  at 10;00 AM to assess client needs     [name].Mindy Behnken MSW, LCSW Licensed Clinical Social Worker [facility] Care Management [phone]

## 2021-02-15 NOTE — Patient Instructions (Addendum)
Visit Information  Patient Goals:  Protect My Health (Patient). Manage Depression issues. Manage anxiety Issues faced; Manage in home care needs  Timeframe:  Short-Term Goal Priority:  Medium Progress: On Track Start Date:       02/15/21              Expected End Date:    05/17/21           Follow Up Date:   04/16/21 at 10:00 AM    Protect  My Health (Patient).  Manage Depression issues . Manage anxiety issues faced. Manage in home care needs   Why is this important?   Screening tests can find diseases early when they are easier to treat.  Your doctor or nurse will talk with you about which tests are important for you.  Getting shots for common diseases like the flu and shingles will help prevent them.     Patient Self Care Activities:  Self administers medications as prescribed Attends all scheduled provider appointments Performs ADL's independently  Patient Coping Strengths:  [place] friends  Patient Self Care Deficits:  Depression issues Mobility issues Low energy  Patient Goals:  - spend time or talk with others at least 2 to 3 times per week - practice relaxation or meditation daily - keep a calendar with appointment dates  Follow Up Plan: LCSW to call client on 04/16/21 at 10:00 AM to assess client needs   [name].Daleah Coulson MSW, LCSW Licensed Clinical Social Worker [facility] Care Management [phone]

## 2021-02-22 ENCOUNTER — Inpatient Hospital Stay (HOSPITAL_COMMUNITY): Payer: Medicare PPO

## 2021-02-22 ENCOUNTER — Inpatient Hospital Stay (HOSPITAL_COMMUNITY): Payer: Medicare PPO | Attending: Physician Assistant

## 2021-02-24 DIAGNOSIS — E785 Hyperlipidemia, unspecified: Secondary | ICD-10-CM

## 2021-02-24 DIAGNOSIS — F339 Major depressive disorder, recurrent, unspecified: Secondary | ICD-10-CM

## 2021-02-24 DIAGNOSIS — J449 Chronic obstructive pulmonary disease, unspecified: Secondary | ICD-10-CM

## 2021-02-24 DIAGNOSIS — D500 Iron deficiency anemia secondary to blood loss (chronic): Secondary | ICD-10-CM

## 2021-02-24 DIAGNOSIS — E1159 Type 2 diabetes mellitus with other circulatory complications: Secondary | ICD-10-CM

## 2021-02-24 DIAGNOSIS — I110 Hypertensive heart disease with heart failure: Secondary | ICD-10-CM

## 2021-02-24 DIAGNOSIS — I5032 Chronic diastolic (congestive) heart failure: Secondary | ICD-10-CM

## 2021-02-26 ENCOUNTER — Ambulatory Visit: Payer: Medicare PPO | Admitting: Family Medicine

## 2021-02-26 ENCOUNTER — Encounter: Payer: Self-pay | Admitting: Family Medicine

## 2021-02-26 VITALS — BP 140/69 | HR 88 | Temp 98.1°F | Ht 62.0 in | Wt 288.4 lb

## 2021-02-26 DIAGNOSIS — L304 Erythema intertrigo: Secondary | ICD-10-CM

## 2021-02-26 DIAGNOSIS — F339 Major depressive disorder, recurrent, unspecified: Secondary | ICD-10-CM

## 2021-02-26 DIAGNOSIS — E785 Hyperlipidemia, unspecified: Secondary | ICD-10-CM

## 2021-02-26 DIAGNOSIS — D500 Iron deficiency anemia secondary to blood loss (chronic): Secondary | ICD-10-CM

## 2021-02-26 DIAGNOSIS — E1159 Type 2 diabetes mellitus with other circulatory complications: Secondary | ICD-10-CM

## 2021-02-26 DIAGNOSIS — S81811A Laceration without foreign body, right lower leg, initial encounter: Secondary | ICD-10-CM

## 2021-02-26 DIAGNOSIS — I152 Hypertension secondary to endocrine disorders: Secondary | ICD-10-CM

## 2021-02-26 DIAGNOSIS — E1169 Type 2 diabetes mellitus with other specified complication: Secondary | ICD-10-CM

## 2021-02-26 MED ORDER — NYSTATIN 100000 UNIT/GM EX CREA: 1.0000 "application " | TOPICAL_CREAM | Freq: Two times a day (BID) | CUTANEOUS | 1 refills | Status: DC

## 2021-02-26 MED ORDER — DESVENLAFAXINE SUCCINATE ER 50 MG PO TB24: 50.0000 mg | ORAL_TABLET | Freq: Every day | ORAL | 2 refills | Status: DC

## 2021-02-26 NOTE — Progress Notes (Signed)
Subjective: CC: Anxiety PCP: [name], DO Savannah Burns is a 70 y.o. is accompanied today's visit by her granddaughter.  She is  presenting to clinic today for:  1.  Anxiety disorder Patient is currently prescribed Lexapro 10 mg daily but she notes that she has been off of all of her medications for the last couple of months because she just wanted to purge and see if she felt better.  She in fact does feel better being off of all meds.  The only thing that she has continue to take was intermittent Percocet for severe pain.  She is reporting improvement in alertness, etc.  She does admit that she continues to struggle with anxiety but is reluctant to go back on the Lexapro because she did not find it especially helpful.  She is also been on Zoloft in the past which caused bad side effects.  2.  Diabetes/ rash Patient is also no longer compliant with her metformin.  She does not monitor blood sugars.  Her previous A1c's were somewhat skewed because she was receiving transfusions.  It was advised that we obtain a serum fructosamine to further evaluate blood sugar today.  Denies any blurred vision, polydipsia or polyuria.  She does report rash in her groin and would like a refill on the nystatin cream that was previously helpful.  She has noticed a little bit more swelling in her legs since discontinuation of the Demadex and in fact had a skin tear on the right anterior lower leg that she would like me to take a look at today.  Denies any purulence but there is some erythema and what appears to be a fluid-filled pouch   ROS: Per HPI  Allergies  Allergen Reactions   Keflex [Cephalexin] Nausea And Vomiting   Past Medical History:  Diagnosis Date   Adrenal adenoma, left    Stable   Anxiety    Arthritis    bilateral hands   Depression    Diabetes mellitus, type 2 ([place]) 08/12/2008   Qualifier: Diagnosis of  By: Deborra Medina MD, Talia     Dyspnea    Esophageal varices (HCC)     Grade II diastolic dysfunction    History of kidney stones    Hyperlipidemia    Hypertension    Lower back pain    Lower GI bleed 03/19/2020   Panic attacks    Pneumonia    currently taking antibiotic and prednisone for early stages of pneumonia   Pulmonary nodules    bilateral   Skin cancer    face    Current Outpatient Medications:    Blood Glucose Calibration (GLUCOMETER DEX HIGH CONTROL) LIQD, Test CBG's once daily. , Disp: , Rfl:    Blood Glucose Monitoring Suppl (TRUE METRIX AIR GLUCOSE METER) w/Device KIT, Check BS daily Dx E11.9, Disp: 1 kit, Rfl: 0   Cholecalciferol (VITAMIN D) 50 MCG (2000 UT) tablet, Take 4,000 Units by mouth daily., Disp: , Rfl:    erythromycin ophthalmic ointment, Place 1 application into the left eye at bedtime., Disp: 3.5 g, Rfl: 1   escitalopram (LEXAPRO) 10 MG tablet, Take 1 tablet (10 mg total) by mouth daily., Disp: 90 tablet, Rfl: 3   esomeprazole (NEXIUM) 20 MG capsule, Take 1 capsule (20 mg total) by mouth daily at 12 noon., Disp: 90 capsule, Rfl: 3   furosemide (LASIX) 20 MG tablet, Take 20 mg by mouth. bid, Disp: , Rfl:    glucose blood (TRUE METRIX BLOOD  GLUCOSE TEST) test strip, Check BS daily Dx E11.9, Disp: 100 each, Rfl: 3   lisinopril (ZESTRIL) 10 MG tablet, Take 1 tablet by mouth once daily (Patient taking differently: Take 10 mg by mouth daily.), Disp: 90 tablet, Rfl: 0   lovastatin (MEVACOR) 20 MG tablet, Take 1 tablet (20 mg total) by mouth at bedtime., Disp: 90 tablet, Rfl: 3   metFORMIN (GLUCOPHAGE) 1000 MG tablet, Take 1 tablet (1,000 mg total) by mouth daily with breakfast., Disp: 90 tablet, Rfl: 3   nadolol (CORGARD) 20 MG tablet, Take 1 tablet (20 mg total) by mouth daily., Disp: 30 tablet, Rfl: 3   nystatin cream (MYCOSTATIN), Apply 1 application topically 2 (two) times daily. X14 days or until rash resolved, Disp: 60 g, Rfl: 1   oxyCODONE-acetaminophen (PERCOCET) 10-325 MG tablet, Take 1 tablet by mouth every 6 (six) hours as  needed for pain., Disp: , Rfl:    OXYGEN, Inhale 3 L into the lungs continuous., Disp: , Rfl:    potassium chloride SA (KLOR-CON) 20 MEQ tablet, Take 2 tablets (40 mEq total) by mouth 2 (two) times daily. (Patient taking differently: Take 40 mEq by mouth daily.), Disp: 360 tablet, Rfl: 3   TRUEplus Lancets 30G MISC, Check BS daily Dx E11.9, Disp: 100 each, Rfl: 3   vitamin B-12 (CYANOCOBALAMIN) 1000 MCG tablet, Take 2,000 mcg by mouth daily., Disp: , Rfl:    ferrous sulfate 325 (65 FE) MG tablet, Take 1 tablet (325 mg total) by mouth 2 (two) times daily with a meal. (Patient taking differently: Take 325 mg by mouth daily with breakfast.), Disp: 60 tablet, Rfl: 1   torsemide (DEMADEX) 20 MG tablet, Take 3 tablets (60 mg total) by mouth 2 (two) times daily. (Patient taking differently: Take 50 mg by mouth 2 (two) times daily.), Disp: 180 tablet, Rfl: 11 Social History   Socioeconomic History   Marital status: Widowed    Spouse name: Not on file   Number of children: 2   Years of education: 14   Highest education level: Not on file  Occupational History   Occupation: Retired   Tobacco Use   Smoking status: Former    Packs/day: 1.50    Years: 40.00    Pack years: 60.00    Types: Cigarettes    Quit date: 04/29/2015    Years since quitting: 5.8   Smokeless tobacco: Never   Tobacco comments:    Quit smoking 04/2015- Previous 1.5 ppd smoker  Vaping Use   Vaping Use: Never used  Substance and Sexual Activity   Alcohol use: No    Alcohol/week: 0.0 standard drinks   Drug use: No   Sexual activity: Not Currently    Birth control/protection: Post-menopausal  Other Topics Concern   Not on file  Social History Narrative   Her 20 year old granddaughter lives with her - one daughter lives nearby, but she doesn't have a good relationship with her. Has a great relationship with other daughter who lives 1.5 hrs away - talks to her daily on the phone.   Social Determinants of Health   Financial  Resource Strain: Low Risk    Difficulty of Paying Living Expenses: Not very hard  Food Insecurity: No Food Insecurity   Worried About Charity fundraiser in the Last Year: Never true   Ran Out of Food in the Last Year: Never true  Transportation Needs: No Transportation Needs   Lack of Transportation (Medical): No   Lack of Transportation (Non-Medical):  No  Physical Activity: Inactive   Days of Exercise per Week: 0 days   Minutes of Exercise per Session: 0 min  Stress: Stress Concern Present   Feeling of Stress : To some extent  Social Connections: Socially Isolated   Frequency of Communication with Friends and Family: More than three times a week   Frequency of Social Gatherings with Friends and Family: Once a week   Attends Religious Services: Never   Marine scientist or Organizations: No   Attends Archivist Meetings: Never   Marital Status: Widowed  Human resources officer Violence: Not At Risk   Fear of Current or Ex-Partner: No   Emotionally Abused: No   Physically Abused: No   Sexually Abused: No   Family History  Problem Relation Age of Onset   Diabetes Father    Heart disease Father 101       MI   Hypertension Father    Anemia Mother        Transfusion dependent   COPD Sister    Cancer Paternal Grandmother 33       Pancreatic    Objective: Office vital signs reviewed. BP 140/69   Pulse 88   Temp 98.1 F (36.7 C)   Ht 5' 2" (1.575 m)   Wt 288 lb 6.4 oz (130.8 kg)   SpO2 96%   BMI 52.75 kg/m   Physical Examination:  General: Awake, alert, morbidly obese, No acute distress HEENT: Sclera white.  Moist mucous membranes Cardio: regular rate and rhythm, S1S2 heard, no murmurs appreciated Pulm: clear to auscultation bilaterally, no wheezes, rhonchi or rales; normal work of breathing on room air Extremities: Right lower leg with about a half dollar sized skin tear.  There is an appreciable blood blister in this area.  There is erythema of bilateral  lower extremities but no appreciable warmth over the area.  No purulent discharge or active bleeding. MSK: Arrives in wheelchair Psych: Mood stable, speech normal, affect appropriate  Depression screen [facility] 2/9 02/26/2021 02/15/2021 01/27/2021  Decreased Interest _0 Down, Depressed, Hopeless _1 PHQ - 2 Score _2 Altered sleeping _3 Tired, decreased energy _4 Change in appetite 1 0 0  Feeling bad or failure about yourself  _5 Trouble concentrating _6 Moving slowly or fidgety/restless 0 2 1  Suicidal thoughts 0 0 0  PHQ-9 Score _7 Difficult doing work/chores Very difficult Somewhat difficult Somewhat difficult  Some recent data might be hidden   GAD 7 : Generalized Anxiety Score 02/26/2021 12/09/2020 12/05/2019 12/22/2017  Nervous, Anxious, on Edge 3 0 1 1  Control/stop worrying _8 Worry too much - different things _9 Trouble relaxing 2 0 0 1  Restless 2 0 0 0  Easily annoyed or irritable _10 Afraid - awful might happen 2 1 0 3  Total GAD 7 Score _11 Anxiety Difficulty Very difficult Somewhat difficult Somewhat difficult Somewhat difficult   Assessment/ Plan: 70 y.o. female   Type 2 diabetes mellitus with other specified complication, without long-term current use of insulin (HCC) - Plan: Fructosamine  Hypertension associated with diabetes ([place])  Hyperlipidemia associated with type 2 diabetes mellitus (HCC)  Depression, recurrent ([place]) - Plan: desvenlafaxine (PRISTIQ) 50 MG 24 hr tablet  Class 3 obesity (HCC)  Iron deficiency anemia due  to chronic blood loss - Plan: CBC  Intertrigo - Plan: nystatin cream (MYCOSTATIN)  Skin tear of right lower leg without complication, initial encounter - Plan: mupirocin cream (BACTROBAN) 2 %  According to the fructosamine level her A1c is consistent around 5.6 and this is without the metformin.  She may not need to resume use of medicine at this time but I encouraged her to check  point-of-care glucose as this may be the most reflective of what her sugars are actually running  Blood pressure is borderline but technically controlled  Would recommend that she resume her statin given known diabetes, morbid obesity and cardiovascular disease.  She seems reluctant to do so.  I will CC her cardiologist as Juluis Rainier  Would like her to start on Pristiq 50 mg since she has been totally off of her SSRI and seems to have not really improved with SSRIs in the past.  Would like to reconvene in about 6 weeks, sooner if concerns arise  Check CBC given known history of GI bleeding.  I highly recommended that she resume use of her PPI given known history of GI bleed which is required transfusion in the past.  She seemed amenable to that  Nystatin cream renewed for intertrigo  For the skin tear in her leg I am going to give her some Bactroban to have on hand.  I worried that she may develop soft tissue infection given multiple comorbidities, ongoing lower extremity edema and morbid obesity.  I again reinforced concern that she is not utilizing the medications as prescribed  No orders of the defined types were placed in this encounter.  No orders of the defined types were placed in this encounter.    [name], DO [place] [phone]

## 2021-02-27 LAB — CBC
Hematocrit: 30.5 % — ABNORMAL LOW (ref 34.0–46.6)
Hemoglobin: 9.1 g/dL — ABNORMAL LOW (ref 11.1–15.9)
MCH: 25.9 pg — ABNORMAL LOW (ref 26.6–33.0)
MCHC: 29.8 g/dL — ABNORMAL LOW (ref 31.5–35.7)
MCV: 87 fL (ref 79–97)
Platelets: 104 10*3/uL — ABNORMAL LOW (ref 150–450)
RBC: 3.52 x10E6/uL — ABNORMAL LOW (ref 3.77–5.28)
RDW: 16.4 % — ABNORMAL HIGH (ref 11.7–15.4)
WBC: 4.9 10*3/uL (ref 3.4–10.8)

## 2021-02-27 LAB — FRUCTOSAMINE: Fructosamine: 242 umol/L (ref 0–285)

## 2021-03-01 ENCOUNTER — Other Ambulatory Visit: Payer: Self-pay | Admitting: Family Medicine

## 2021-03-01 DIAGNOSIS — S81811A Laceration without foreign body, right lower leg, initial encounter: Secondary | ICD-10-CM

## 2021-03-01 MED ORDER — MUPIROCIN CALCIUM 2 % EX CREA: 1.0000 "application " | TOPICAL_CREAM | Freq: Two times a day (BID) | CUTANEOUS | 0 refills | Status: DC

## 2021-03-02 ENCOUNTER — Other Ambulatory Visit: Payer: Self-pay | Admitting: Family Medicine

## 2021-03-02 MED ORDER — MUPIROCIN 2 % EX OINT: 1.0000 "application " | TOPICAL_OINTMENT | Freq: Two times a day (BID) | CUTANEOUS | 0 refills | Status: AC

## 2021-03-03 ENCOUNTER — Encounter (HOSPITAL_COMMUNITY): Payer: Self-pay

## 2021-03-03 DIAGNOSIS — M5136 Other intervertebral disc degeneration, lumbar region: Secondary | ICD-10-CM | POA: Diagnosis not present

## 2021-03-03 DIAGNOSIS — Z79899 Other long term (current) drug therapy: Secondary | ICD-10-CM | POA: Diagnosis not present

## 2021-03-03 DIAGNOSIS — Z6841 Body Mass Index (BMI) 40.0 and over, adult: Secondary | ICD-10-CM | POA: Diagnosis not present

## 2021-03-03 DIAGNOSIS — G894 Chronic pain syndrome: Secondary | ICD-10-CM | POA: Diagnosis not present

## 2021-03-03 NOTE — Progress Notes (Signed)
From: Candis Musa, LPN Sent: 12/28/1115   4:23 PM EST To: Harriett Rush, PA-C  Patient called stating that she had her blood checked at Dr. Marjean Donna office and her hemoglobin dropped from 9.4 to 9.1. She is coming in on 03-24-2021 and was wanting to see if Eugene Garnet would let her get an iron infusion at that appointment. Please advise [phone].  From: Konrad Saha Sent: 03/02/2021   4:29 PM EST To: Candis Musa, LPN  It will depend on what her labs show on 03/24/2021.  If you can please ask the schedulers to go ahead and put her on as a possible IV iron infusion that day, we can always have her scheduled, and if her labs justify it we would be able to go ahead and give it that day.  Her mild drop in Hgb from 9.4 to 9.1 is nothing to worry about, but she is someone that we know looses blood fairly often, so I would not be surprised if she needs iron.  Patient aware and agreeable with plan. Message sent to schedulers to schedule possible iron infusion on 03/24/2021.

## 2021-03-23 ENCOUNTER — Encounter: Payer: Self-pay | Admitting: *Deleted

## 2021-03-24 ENCOUNTER — Ambulatory Visit (HOSPITAL_COMMUNITY): Payer: Medicare PPO

## 2021-03-24 ENCOUNTER — Other Ambulatory Visit: Payer: Self-pay

## 2021-03-24 ENCOUNTER — Other Ambulatory Visit (HOSPITAL_COMMUNITY): Payer: Medicare PPO

## 2021-03-24 ENCOUNTER — Inpatient Hospital Stay (HOSPITAL_COMMUNITY): Payer: Medicare PPO

## 2021-03-24 ENCOUNTER — Inpatient Hospital Stay (HOSPITAL_COMMUNITY): Payer: Medicare PPO | Attending: Physician Assistant

## 2021-03-24 ENCOUNTER — Ambulatory Visit (HOSPITAL_COMMUNITY): Payer: Medicare PPO | Admitting: Hematology

## 2021-03-24 ENCOUNTER — Encounter (HOSPITAL_COMMUNITY): Payer: Self-pay

## 2021-03-24 VITALS — BP 122/57 | HR 85 | Temp 98.6°F | Resp 18

## 2021-03-24 DIAGNOSIS — D472 Monoclonal gammopathy: Secondary | ICD-10-CM

## 2021-03-24 DIAGNOSIS — C9000 Multiple myeloma not having achieved remission: Secondary | ICD-10-CM | POA: Diagnosis not present

## 2021-03-24 DIAGNOSIS — D500 Iron deficiency anemia secondary to blood loss (chronic): Secondary | ICD-10-CM

## 2021-03-24 DIAGNOSIS — E538 Deficiency of other specified B group vitamins: Secondary | ICD-10-CM | POA: Insufficient documentation

## 2021-03-24 DIAGNOSIS — D509 Iron deficiency anemia, unspecified: Secondary | ICD-10-CM | POA: Diagnosis not present

## 2021-03-24 LAB — COMPREHENSIVE METABOLIC PANEL
ALT: 13 U/L (ref 0–44)
AST: 22 U/L (ref 15–41)
Albumin: 2.9 g/dL — ABNORMAL LOW (ref 3.5–5.0)
Alkaline Phosphatase: 82 U/L (ref 38–126)
Anion gap: 5 (ref 5–15)
BUN: 14 mg/dL (ref 8–23)
CO2: 28 mmol/L (ref 22–32)
Calcium: 8.5 mg/dL — ABNORMAL LOW (ref 8.9–10.3)
Chloride: 104 mmol/L (ref 98–111)
Creatinine, Ser: 0.62 mg/dL (ref 0.44–1.00)
GFR, Estimated: >60 mL/min (ref >60)
Glucose, Bld: 150 mg/dL — ABNORMAL HIGH (ref 70–99)
Potassium: 3.7 mmol/L (ref 3.5–5.1)
Sodium: 137 mmol/L (ref 135–145)
Total Bilirubin: 0.1 mg/dL — ABNORMAL LOW (ref 0.3–1.2)
Total Protein: 8.1 g/dL (ref 6.5–8.1)

## 2021-03-24 LAB — CBC WITH DIFFERENTIAL/PLATELET
Abs Immature Granulocytes: 0.01 10*3/uL (ref 0.00–0.07)
Basophils Absolute: 0 10*3/uL (ref 0.0–0.1)
Basophils Relative: 0 %
Eosinophils Absolute: 0.1 10*3/uL (ref 0.0–0.5)
Eosinophils Relative: 1 %
HCT: 26.7 % — ABNORMAL LOW (ref 36.0–46.0)
Hemoglobin: 7.5 g/dL — ABNORMAL LOW (ref 12.0–15.0)
Immature Granulocytes: 0 %
Lymphocytes Relative: 25 %
Lymphs Abs: 1 10*3/uL (ref 0.7–4.0)
MCH: 24.1 pg — ABNORMAL LOW (ref 26.0–34.0)
MCHC: 28.1 g/dL — ABNORMAL LOW (ref 30.0–36.0)
MCV: 85.9 fL (ref 80.0–100.0)
Monocytes Absolute: 0.4 10*3/uL (ref 0.1–1.0)
Monocytes Relative: 10 %
Neutro Abs: 2.4 10*3/uL (ref 1.7–7.7)
Neutrophils Relative %: 64 %
Platelets: 89 10*3/uL — ABNORMAL LOW (ref 150–400)
RBC: 3.11 MIL/uL — ABNORMAL LOW (ref 3.87–5.11)
RDW: 18 % — ABNORMAL HIGH (ref 11.5–15.5)
WBC: 3.9 10*3/uL — ABNORMAL LOW (ref 4.0–10.5)
nRBC: 0 % (ref 0.0–0.2)

## 2021-03-24 LAB — IRON AND TIBC
Iron: 24 ug/dL — ABNORMAL LOW (ref 28–170)
Saturation Ratios: 5 % — ABNORMAL LOW (ref 10.4–31.8)
TIBC: 451 ug/dL — ABNORMAL HIGH (ref 250–450)
UIBC: 427 ug/dL

## 2021-03-24 LAB — FERRITIN: Ferritin: 5 ng/mL — ABNORMAL LOW (ref 11–307)

## 2021-03-24 LAB — SAMPLE TO BLOOD BANK

## 2021-03-24 LAB — LACTATE DEHYDROGENASE: LDH: 103 U/L (ref 98–192)

## 2021-03-24 LAB — VITAMIN B12: Vitamin B-12: 385 pg/mL (ref 180–914)

## 2021-03-24 MED ORDER — SODIUM CHLORIDE 0.9 % IV SOLN
300.0000 mg | Freq: Once | INTRAVENOUS | Status: AC
  Administered 2021-03-24: 12:00:00 300 mg via INTRAVENOUS
  Filled 2021-03-24: qty 15

## 2021-03-24 MED ORDER — SODIUM CHLORIDE 0.9% FLUSH: 3.0000 mL | Freq: Once | INTRAVENOUS | Status: DC | PRN

## 2021-03-24 MED ORDER — SODIUM CHLORIDE 0.9 % IV SOLN: Freq: Once | INTRAVENOUS | Status: AC

## 2021-03-24 MED ORDER — SODIUM CHLORIDE 0.9% FLUSH: 10.0000 mL | Freq: Once | INTRAVENOUS | Status: DC | PRN

## 2021-03-24 MED ORDER — ALTEPLASE 2 MG IJ SOLR: 2.0000 mg | Freq: Once | INTRAMUSCULAR | Status: DC | PRN

## 2021-03-24 MED ORDER — HEPARIN SOD (PORK) LOCK FLUSH 100 UNIT/ML IV SOLN: 500.0000 [IU] | Freq: Once | INTRAVENOUS | Status: DC | PRN

## 2021-03-24 MED ORDER — CYANOCOBALAMIN 1000 MCG/ML IJ SOLN
1000.0000 ug | Freq: Once | INTRAMUSCULAR | Status: AC
  Administered 2021-03-24: 12:00:00 1000 ug via INTRAMUSCULAR
  Filled 2021-03-24: qty 1

## 2021-03-24 MED ORDER — HEPARIN SOD (PORK) LOCK FLUSH 100 UNIT/ML IV SOLN: 250.0000 [IU] | Freq: Once | INTRAVENOUS | Status: DC | PRN

## 2021-03-24 NOTE — Patient Instructions (Signed)
[facility]  Discharge Instructions: Thank you for choosing [facility] to provide your oncology and hematology care.  If you have a lab appointment with the Cancer Center, please come in thru the Main Entrance and check in at the main information desk.  Wear comfortable clothing and clothing appropriate for easy access to any Portacath or PICC line.   We strive to give you quality time with your provider. You may need to reschedule your appointment if you arrive late (15 or more minutes).  Arriving late affects you and other patients whose appointments are after yours.  Also, if you miss three or more appointments without notifying the office, you may be dismissed from the clinic at the provider's discretion.      For prescription refill requests, have your pharmacy contact our office and allow 72 hours for refills to be completed.        To help prevent nausea and vomiting after your treatment, we encourage you to take your nausea medication as directed.  BELOW ARE SYMPTOMS THAT SHOULD BE REPORTED IMMEDIATELY: *FEVER GREATER THAN 100.4 F (38 C) OR HIGHER *CHILLS OR SWEATING *NAUSEA AND VOMITING THAT IS NOT CONTROLLED WITH YOUR NAUSEA MEDICATION *UNUSUAL SHORTNESS OF BREATH *UNUSUAL BRUISING OR BLEEDING *URINARY PROBLEMS (pain or burning when urinating, or frequent urination) *BOWEL PROBLEMS (unusual diarrhea, constipation, pain near the anus) TENDERNESS IN MOUTH AND THROAT WITH OR WITHOUT PRESENCE OF ULCERS (sore throat, sores in mouth, or a toothache) UNUSUAL RASH, SWELLING OR PAIN  UNUSUAL VAGINAL DISCHARGE OR ITCHING   Items with * indicate a potential emergency and should be followed up as soon as possible or go to the Emergency Department if any problems should occur.  Please show the CHEMOTHERAPY ALERT CARD or IMMUNOTHERAPY ALERT CARD at check-in to the Emergency Department and triage nurse.  Should you have questions after your visit or need to cancel  or reschedule your appointment, please contact [facility] [phone]  and follow the prompts.  Office hours are 8:00 a.m. to 4:30 p.m. Monday - Friday. Please note that voicemails left after 4:00 p.m. may not be returned until the following business day.  We are closed weekends and major holidays. You have access to a nurse at all times for urgent questions. Please call the main number to the clinic [phone] and follow the prompts.  For any non-urgent questions, you may also contact your provider using MyChart. We now offer e-Visits for anyone 18 and older to request care online for non-urgent symptoms. For details visit [email or web address].   Also download the MyChart app! Go to the app store, search "MyChart", open the app, select [facility], and log in with your MyChart username and password.  Due to Covid, a mask is required upon entering the hospital/clinic. If you do not have a mask, one will be given to you upon arrival. For doctor visits, patients may have 1 support person aged 18 or older with them. For treatment visits, patients cannot have anyone with them due to current Covid guidelines and our immunocompromised population.  

## 2021-03-24 NOTE — Progress Notes (Signed)
Patient presents today for Venofer 300 mg iron infusion. Vital signs are stable. Patient denies any changes since the last iron infusion. Patient denies any complaints today. MAR reviewed and updated.

## 2021-03-24 NOTE — Progress Notes (Signed)
Patient took Benadryl from home for iron infusion today.  Pharmacy aware.   Patient tolerated iron infusion with no complaints voiced.  Peripheral IV site clean and dry with good blood return noted before and after infusion.  Band aid applied.  VSS with discharge and left in satisfactory condition with no s/s of distress noted.

## 2021-03-25 ENCOUNTER — Ambulatory Visit (INDEPENDENT_AMBULATORY_CARE_PROVIDER_SITE_OTHER): Payer: Medicare PPO | Admitting: Licensed Clinical Social Worker

## 2021-03-25 DIAGNOSIS — E1159 Type 2 diabetes mellitus with other circulatory complications: Secondary | ICD-10-CM

## 2021-03-25 DIAGNOSIS — D500 Iron deficiency anemia secondary to blood loss (chronic): Secondary | ICD-10-CM

## 2021-03-25 DIAGNOSIS — F339 Major depressive disorder, recurrent, unspecified: Secondary | ICD-10-CM

## 2021-03-25 DIAGNOSIS — J449 Chronic obstructive pulmonary disease, unspecified: Secondary | ICD-10-CM

## 2021-03-25 DIAGNOSIS — E1169 Type 2 diabetes mellitus with other specified complication: Secondary | ICD-10-CM

## 2021-03-25 DIAGNOSIS — E785 Hyperlipidemia, unspecified: Secondary | ICD-10-CM

## 2021-03-25 LAB — KAPPA/LAMBDA LIGHT CHAINS
Kappa free light chain: 72.1 mg/L — ABNORMAL HIGH (ref 3.3–19.4)
Kappa, lambda light chain ratio: 4.03 — ABNORMAL HIGH (ref 0.26–1.65)
Lambda free light chains: 17.9 mg/L (ref 5.7–26.3)

## 2021-03-25 NOTE — Patient Instructions (Addendum)
Visit Information  Patient Goals:  Protect My Health (Patient). Manage Depression issues. Manage anxiety issues faced. Manage in home care needs.  Timeframe:  Short-Term Goal Priority:  Medium Progress: Not On Track Start Date:      03/25/21              Expected End Date:    06/21/21           Follow Up Date:   04/16/21 at 10:00 AM    Protect  My Health (Patient) Manage Depression issues . Manage anxiety issues faced; Manage in home care needs   Why is this important?   Screening tests can find diseases early when they are easier to treat.  Your doctor or nurse will talk with you about which tests are important for you.  Getting shots for common diseases like the flu and shingles will help prevent them.     Patient Self Care Activities:  Self administers medications as prescribed Attends all scheduled provider appointments Performs ADL's independently  Patient Coping Strengths:  [name] friends  Patient Self Care Deficits:  Depression issues Mobility issues Low energy  Patient Goals:  - spend time or talk with others at least 2 to 3 times per week - practice relaxation or meditation daily - keep a calendar with appointment dates  Follow Up Plan: LCSW to call client on 04/16/21 at 10:00 AM to assess client needs   [name].Savannah Burns MSW, [place] representative [facility] Care Management [phone]

## 2021-03-25 NOTE — Chronic Care Management (AMB) (Signed)
Chronic Care Management    Clinical Social Work Note  03/25/2021 Name: Savannah Burns MRN: [id number] DOB: 08/22/1950  Savannah Burns is a 70 y.o. year old female who is a primary care patient of [name], DO. The CCM team was consulted to assist the patient with chronic disease management and/or care coordination needs related to: [employer] .   Engaged with patient by telephone for follow up visit in response to provider referral for social work chronic care management and care coordination services.   Consent to Services:  The patient was given information about Chronic Care Management services, agreed to services, and gave verbal consent prior to initiation of services.  Please see initial visit note for detailed documentation.   Patient agreed to services and consent obtained.   Assessment: Review of patient past medical history, allergies, medications, and health status, including review of relevant consultants reports was performed today as part of a comprehensive evaluation and provision of chronic care management and care coordination services.     SDOH (Social Determinants of Health) assessments and interventions performed:  SDOH Interventions    Flowsheet Row Most Recent Value  SDOH Interventions   Physical Activity Interventions Other (Comments)  [client has walking challenges]  Stress Interventions Provide Counseling  [client has stress related to managing medical needs. client has stress related to managing financial needs]  Depression Interventions/Treatment  Medication        Advanced Directives Status: See [employer] application for related entries.  CCM Care Plan  Allergies  Allergen Reactions   Keflex [Cephalexin] Nausea And Vomiting    Outpatient Encounter Medications as of 03/25/2021  Medication Sig Note   Blood Glucose Calibration (GLUCOMETER DEX HIGH CONTROL) LIQD Test CBG's once daily.     Blood Glucose Monitoring Suppl (TRUE  METRIX AIR GLUCOSE METER) w/Device KIT Check BS daily Dx E11.9    Cholecalciferol (VITAMIN D) 50 MCG (2000 UT) tablet Take 4,000 Units by mouth daily.    desvenlafaxine (PRISTIQ) 50 MG 24 hr tablet Take 1 tablet (50 mg total) by mouth daily.    erythromycin ophthalmic ointment Place 1 application into the left eye at bedtime.    esomeprazole (NEXIUM) 20 MG capsule Take 1 capsule (20 mg total) by mouth daily at 12 noon.    ferrous sulfate 325 (65 FE) MG tablet Take 1 tablet (325 mg total) by mouth 2 (two) times daily with a meal. (Patient taking differently: Take 325 mg by mouth daily with breakfast.)    furosemide (LASIX) 20 MG tablet Take 20 mg by mouth. bid    glucose blood (TRUE METRIX BLOOD GLUCOSE TEST) test strip Check BS daily Dx E11.9    lovastatin (MEVACOR) 20 MG tablet Take 1 tablet (20 mg total) by mouth at bedtime.    metFORMIN (GLUCOPHAGE) 1000 MG tablet Take 1 tablet (1,000 mg total) by mouth daily with breakfast.    nadolol (CORGARD) 20 MG tablet Take 1 tablet (20 mg total) by mouth daily. 08/04/2020: No longer taking   nystatin cream (MYCOSTATIN) Apply 1 application topically 2 (two) times daily. X14 days or until rash resolved    oxyCODONE-acetaminophen (PERCOCET) 10-325 MG tablet Take 1 tablet by mouth every 6 (six) hours as needed for pain.    OXYGEN Inhale 3 L into the lungs continuous.    potassium chloride SA (KLOR-CON) 20 MEQ tablet Take 2 tablets (40 mEq total) by mouth 2 (two) times daily. (Patient taking differently: Take 40 mEq by mouth daily.)  torsemide (DEMADEX) 20 MG tablet Take 3 tablets (60 mg total) by mouth 2 (two) times daily. (Patient taking differently: Take 50 mg by mouth 2 (two) times daily.)    TRUEplus Lancets 30G MISC Check BS daily Dx E11.9    vitamin B-12 (CYANOCOBALAMIN) 1000 MCG tablet Take 2,000 mcg by mouth daily.    [DISCONTINUED] albuterol (PROVENTIL HFA;VENTOLIN HFA) 108 (90 Base) MCG/ACT inhaler Inhale 2 puffs into the lungs every 6 (six) hours  as needed for wheezing or shortness of breath.    No facility-administered encounter medications on file as of 03/25/2021.    Patient Active Problem List   Diagnosis Date Noted   Hypoalbuminemia 05/17/2020   Moderate protein malnutrition ([place]) 05/17/2020   Melena 05/16/2020   Hyperlipidemia    Hypokalemia    GI bleed 03/20/2020   Lower GI bleed 03/19/2020   Acute on chronic congestive heart failure (HCC)    Symptomatic anemia    Iron deficiency anemia    Heme positive stool    ABLA (acute blood loss anemia) 01/22/2020   Supplemental oxygen dependent 10/22/2019   Chronic dyspnea 10/22/2019   Hypertension associated with diabetes ([place]) 10/22/2019   Bilateral hand pain 09/21/2016   Fatigue 07/30/2015   Chronic diastolic HF (heart failure) ([place]) 06/29/2015   Postmenopausal bleeding 04/17/2015   Excessive daytime sleepiness 12/19/2014   Swelling of lower extremity 11/27/2014   Back pain 06/13/2013   Sinusitis, chronic 05/30/2012   Allergic rhinitis 06/06/2011   Hyperlipidemia associated with type 2 diabetes mellitus ([place]) 03/24/2009   Diabetes mellitus, type 2 ([place]) 08/12/2008   Pulmonary nodule 08/29/2007   COPD (chronic obstructive pulmonary disease) ([place]) 02/14/2007   Class 3 obesity ([place]) 05/25/2006   TOBACCO DEPENDENCE 05/25/2006   Depression, recurrent ([place]) 05/25/2006   HYPERTENSION, BENIGN SYSTEMIC 05/25/2006   DJD, UNSPECIFIED 05/25/2006    Conditions to be addressed/monitored: monitor client depression issues and monitor client management of depression issues  Care Plan : LCSW Care Plan  Updates made by Katha Cabal, LCSW since 03/25/2021 12:00 AM     Problem: Emotional Distress      Goal: Emotional Health Supported. Manage Depression issues Manage Anxiety issues   Start Date: 03/25/2021  Expected End Date: 06/21/2021  This Visit's Progress: Not on track  Priority: Medium  Note:   Barriers:  Walking challenges, fall risk Depression issues Anxiety  issues Decreased family support Financial issues  Clinical Goals:  Client will communicate with LCSW in next 30 days to discuss depression issues of client and managing depression issues of client Client will communicate with LCSW in next 30 days to discuss anxiety and stress issues of client and managing anxiety/stress issues of client Client will call RNCM as needed in next 30 days to discuss nursing needs of client  Interventions: 1:1 collaboration with Dr. [name], DO regarding development and update of comprehensive plan of care as evidenced by provider attestation and co-signature. Reviewed client needs with [facility] via phone today Discussed sleeping issues of client and appetite of client Discussed transport needs of client. [name] said that her granddaughter, Ishmael Holter , drives her to and from her medical appointments as needed Reviewed edema issues. Client said she has some edema issues Reviewed food supply. Client said she has adequate food supply Provided counseling support for client Discussed client iron infusion treatments. [name] said she had an iron infusion treatment yesterday. She said treatment went well.  She said she is interested in getting a second opinion from  a Copywriter, advertising.  She said she has been receiving iron infusion treatments for about one year and hopes to get medical support in determining source of  bleeding or get support in understanding the cause of her need for iron infusions. Discussed mood status of client. She said she has been sad occasionally. She is sad related to managing her medical needs. Discussed energy level of client. She said she has reduced energy Reviewed client interest in [place] application.  Client said she earned too much to qualify for Medicaid. She said she has not applied for Medicaid at this point. Discussed ambulation of client. Client said she uses a walker to help her walk Discussed difficulty in  standing. Client said she can stand for about 10 minutes and then becomes fatigued and has to take a rest break Encouraged client to call RNCM as needed in next 30 days to discuss nursing needs of client Encouraged client to call Dr. Lottie Dawson, Pharmacist at [facility] for Pharmacy support with CCM program  Patient Self Care Activities:  Attends all scheduled provider appointments Performs ADL's independently  Patient Coping Strengths:  [place] friends  Patient Self Care Deficits:  Depression issues Mobility issues Low energy Financial issues  Patient Goals:  - spend time or talk with others at least 2 to 3 times per week - practice relaxation or meditation daily - keep a calendar with appointment dates  Follow Up Plan: LCSW to call client on 04/16/21 at 10:00 AM to assess client needs     [name].Witten Certain MSW, [place] [employer] representative [facility] Care Management [phone]

## 2021-03-26 LAB — PROTEIN ELECTROPHORESIS, SERUM
A/G Ratio: 0.6 — ABNORMAL LOW (ref 0.7–1.7)
Albumin ELP: 2.9 g/dL (ref 2.9–4.4)
Alpha-1-Globulin: 0.3 g/dL (ref 0.0–0.4)
Alpha-2-Globulin: 0.7 g/dL (ref 0.4–1.0)
Beta Globulin: 1.1 g/dL (ref 0.7–1.3)
Gamma Globulin: 3 g/dL — ABNORMAL HIGH (ref 0.4–1.8)
Globulin, Total: 5.1 g/dL — ABNORMAL HIGH (ref 2.2–3.9)
M-Spike, %: 2.4 g/dL — ABNORMAL HIGH
Total Protein ELP: 8 g/dL (ref 6.0–8.5)

## 2021-03-26 LAB — METHYLMALONIC ACID, SERUM: Methylmalonic Acid, Quantitative: 168 nmol/L (ref 0–378)

## 2021-03-27 DIAGNOSIS — E785 Hyperlipidemia, unspecified: Secondary | ICD-10-CM

## 2021-03-27 DIAGNOSIS — D500 Iron deficiency anemia secondary to blood loss (chronic): Secondary | ICD-10-CM

## 2021-03-27 DIAGNOSIS — E1169 Type 2 diabetes mellitus with other specified complication: Secondary | ICD-10-CM

## 2021-03-27 DIAGNOSIS — E1159 Type 2 diabetes mellitus with other circulatory complications: Secondary | ICD-10-CM

## 2021-03-27 DIAGNOSIS — I152 Hypertension secondary to endocrine disorders: Secondary | ICD-10-CM

## 2021-03-27 DIAGNOSIS — J449 Chronic obstructive pulmonary disease, unspecified: Secondary | ICD-10-CM

## 2021-03-27 DIAGNOSIS — F339 Major depressive disorder, recurrent, unspecified: Secondary | ICD-10-CM

## 2021-03-30 NOTE — Progress Notes (Addendum)
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 female returns for routine follow-up of iron deficiency anemia and smoldering myeloma.  She was last evaluated by Tarri Abernethy PA-C on 01/08/2021 via telemedicine visit.  At today's visit, she reports feeling poorly.  No recent hospitalizations, surgeries, or changes in baseline health status.  Despite the recent drop in her blood counts (Hgb 7.2 today), she denies any gross GI blood loss such as melena, hematochezia, or hematemesis.  She does report that she had a single episode of bright red blood in the toilet about 3 weeks ago, but she thinks that this was hematuria.  It has not recurred since that time.  She reports symptoms of severe fatigue, lightheadedness, and worsening dyspnea on exertion; mild pica cravings for ice chips and moderate headaches.  She did not feel any better after her IV iron infusion on 03/24/2021.  She denies any bruising, petechial rash, hematochezia, epistaxis, or other sources of blood loss.  Progressive dyspnea on exertion as noted above, reports that she can barely get from her recliner chair to the bathroom without running out of breath.  She denies any chest pain, lightheadedness, or syncopal episodes.  She is not taking her fluid pills over a month.  Regarding her smoldering myeloma, she denies any B symptoms such as fever, chills, night sweats, unintentional weight loss. No new bone pain or fractures.  No new onset paresthesias or neuropathy.  No new neurologic deficits.  She denies any frequent  infections.  She has not yet followed up with gastroenterology.  She has been previously seen by Dr. Abbey Chatters, but requested a second opinion from another GI doctor.  Referral was sent to multiple other GI providers, but were declined since she already has an established GI provider. Referral to [facility] was sent for evaluation of lingual varicosities, but patient declined.  (Per conversation with [facility], no real treatment is available for lingual varicosities.)  She has little to no energy and 100% appetite. She endorses that she is maintaining a stable weight.   REVIEW OF SYSTEMS:  Review of Systems  Constitutional:  Positive for fatigue. Negative for appetite change, chills, diaphoresis, fever and unexpected weight change.  HENT:   Negative for lump/mass and nosebleeds.   Eyes:  Negative for eye problems.  Respiratory:  Positive for shortness of breath (With exertion). Negative for cough and hemoptysis.   Cardiovascular:  Positive for leg swelling. Negative for chest pain and palpitations.  Gastrointestinal:  Negative for abdominal pain, blood in stool, constipation, diarrhea, nausea and vomiting.  Genitourinary:  Positive for hematuria.   Skin: Negative.   Neurological:  Positive for headaches. Negative for dizziness and light-headedness.  Hematological:  Does not bruise/bleed easily.  Psychiatric/Behavioral:  Positive for depression.      PAST MEDICAL/SURGICAL HISTORY:  Past Medical History:  Diagnosis Date   Adrenal adenoma, left    Stable   Anxiety    Arthritis    bilateral hands   Depression    Diabetes mellitus, type 2 ([place]) 08/12/2008   Qualifier: Diagnosis of  By: Deborra Medina MD, Tanja Port  Dyspnea    Esophageal varices (HCC)    Grade II diastolic dysfunction    History of kidney stones    Hyperlipidemia    Hypertension    Lower back pain    Lower GI bleed 03/19/2020   Panic attacks    Pneumonia    currently taking antibiotic and prednisone for early stages of pneumonia    Pulmonary nodules    bilateral   Skin cancer    face   Past Surgical History:  Procedure Laterality Date   BIOPSY  04/07/2020   Procedure: BIOPSY;  Surgeon: Eloise Harman, DO;  [facility];  Service: Endoscopy;;   Breast Cystectomy  Right    CESAREAN SECTION     COLONOSCOPY WITH PROPOFOL N/A 01/25/2020   Dr. Abbey Chatters: Nonbleeding internal hemorrhoids, diverticulosis, 5 mm polyp removed from the ascending colon, 10 mm polyp removed from the sigmoid colon, 30 mm polyp (tubulovillous adenoma with no high-grade dysplasia) removed from the transverse colon via piecemeal status post tattoo.  Other polyps were tubular adenomas.  3 month surveillance colonoscopy recommended.   COLONOSCOPY WITH PROPOFOL N/A 04/07/2020   Procedure: COLONOSCOPY WITH PROPOFOL;  Surgeon: Eloise Harman, DO;  [facility];  Service: Endoscopy;  Laterality: N/A;  3:00pm, pt knows new time per office   CYSTOSCOPY/URETEROSCOPY/HOLMIUM LASER/STENT PLACEMENT Bilateral 03/01/2019   Procedure: CYSTOSCOPY/RETROGRADEURETEROSCOPY/HOLMIUM LASER/STENT PLACEMENT;  Surgeon: Ceasar Mons, MD;  Location: [facility];  Service: Urology;  Laterality: Bilateral;  ONLY NEEDS 60 MIN   ESOPHAGOGASTRODUODENOSCOPY (EGD) WITH PROPOFOL N/A 01/25/2020   Dr. Abbey Chatters: 4 columns grade 1 esophageal varices   ESOPHAGOGASTRODUODENOSCOPY (EGD) WITH PROPOFOL N/A 05/18/2020   Procedure: ESOPHAGOGASTRODUODENOSCOPY (EGD) WITH PROPOFOL;  Surgeon: Eloise Harman, DO;  [facility];  Service: Endoscopy;  Laterality: N/A;   ESOPHAGOGASTRODUODENOSCOPY (EGD) WITH PROPOFOL N/A 07/28/2020   Procedure: ESOPHAGOGASTRODUODENOSCOPY (EGD) WITH PROPOFOL;  Surgeon: Eloise Harman, DO;  [facility];  Service: Endoscopy;  Laterality: N/A;  am or early PM due to givens capsule placement   GIVENS CAPSULE STUDY N/A 05/18/2020   Procedure: [place];  Surgeon: Harvel Quale, MD;  [facility];  Service: Gastroenterology;  Laterality: N/A;   GIVENS CAPSULE STUDY N/A 07/28/2020   Procedure: GIVENS CAPSULE STUDY;  Surgeon: Eloise Harman, DO;  [facility];  Service: Endoscopy;  Laterality: N/A;   POLYPECTOMY  01/25/2020   Procedure: POLYPECTOMY;  Surgeon: Eloise Harman, DO;  [facility];  Service: Endoscopy;;   POLYPECTOMY  04/07/2020   Procedure: POLYPECTOMY INTESTINAL;  Surgeon: Eloise Harman, DO;  [facility];  Service: Endoscopy;;   SKIN CANCER EXCISION     Face   SPINE SURGERY     SUBMUCOSAL TATTOO INJECTION  01/25/2020   Procedure: SUBMUCOSAL TATTOO INJECTION;  Surgeon: Eloise Harman, DO;  [facility];  Service: Endoscopy;;     SOCIAL HISTORY:  Social History   Socioeconomic History   Marital status: Widowed    Spouse name: Not on file   Number of children: 2   Years of education: 14   Highest education level: Not on file  Occupational History   Occupation: Retired   Tobacco Use   Smoking status: Former    Packs/day: 1.50    Years: 40.00    Pack years: 60.00    Types: Cigarettes    Quit date: 04/29/2015    Years since quitting: 5.9  Smokeless tobacco: Never   Tobacco comments:    Quit smoking 04/2015- Previous 1.5 ppd smoker  Vaping Use   Vaping Use: Never used  Substance and Sexual Activity   Alcohol use: No    Alcohol/week: 0.0 standard drinks   Drug use: No   Sexual activity: Not Currently    Birth control/protection: Post-menopausal  Other Topics Concern   Not on file  Social History Narrative   Her 66 year old granddaughter lives with her - one daughter lives nearby, but she doesn't have a good relationship with her. Has a great relationship with other daughter who lives 1.5 hrs away - talks to her daily on the phone.   Social Determinants of Health   Financial Resource Strain: Low Risk    Difficulty of Paying Living Expenses: Not very hard  Food Insecurity: No Food Insecurity    Worried About Charity fundraiser in the Last Year: Never true   Ran Out of Food in the Last Year: Never true  Transportation Needs: No Transportation Needs   Lack of Transportation (Medical): No   Lack of Transportation (Non-Medical): No  Physical Activity: Inactive   Days of Exercise per Week: 0 days   Minutes of Exercise per Session: 0 min  Stress: Stress Concern Present   Feeling of Stress : Rather much  Social Connections: Socially Isolated   Frequency of Communication with Friends and Family: More than three times a week   Frequency of Social Gatherings with Friends and Family: Once a week   Attends Religious Services: Never   Marine scientist or Organizations: No   Attends Archivist Meetings: Never   Marital Status: Widowed  Human resources officer Violence: Not At Risk   Fear of Current or Ex-Partner: No   Emotionally Abused: No   Physically Abused: No   Sexually Abused: No    FAMILY HISTORY:  Family History  Problem Relation Age of Onset   Diabetes Father    Heart disease Father 14       MI   Hypertension Father    Anemia Mother        Transfusion dependent   COPD Sister    Cancer Paternal Grandmother 85       Pancreatic    CURRENT MEDICATIONS:  Outpatient Encounter Medications as of 03/31/2021  Medication Sig Note   Blood Glucose Calibration (GLUCOMETER DEX HIGH CONTROL) LIQD Test CBG's once daily.     Blood Glucose Monitoring Suppl (TRUE METRIX AIR GLUCOSE METER) w/Device KIT Check BS daily Dx E11.9    Cholecalciferol (VITAMIN D) 50 MCG (2000 UT) tablet Take 4,000 Units by mouth daily.    desvenlafaxine (PRISTIQ) 50 MG 24 hr tablet Take 1 tablet (50 mg total) by mouth daily.    erythromycin ophthalmic ointment Place 1 application into the left eye at bedtime.    esomeprazole (NEXIUM) 20 MG capsule Take 1 capsule (20 mg total) by mouth daily at 12 noon.    ferrous sulfate 325 (65 FE) MG tablet Take 1 tablet (325 mg total) by mouth 2 (two) times daily  with a meal. (Patient taking differently: Take 325 mg by mouth daily with breakfast.)    furosemide (LASIX) 20 MG tablet Take 20 mg by mouth. bid    glucose blood (TRUE METRIX BLOOD GLUCOSE TEST) test strip Check BS daily Dx E11.9    lovastatin (MEVACOR) 20 MG tablet Take 1 tablet (20 mg total) by mouth at bedtime.    metFORMIN (GLUCOPHAGE)  1000 MG tablet Take 1 tablet (1,000 mg total) by mouth daily with breakfast.    nadolol (CORGARD) 20 MG tablet Take 1 tablet (20 mg total) by mouth daily. 08/04/2020: No longer taking   nystatin cream (MYCOSTATIN) Apply 1 application topically 2 (two) times daily. X14 days or until rash resolved    oxyCODONE-acetaminophen (PERCOCET) 10-325 MG tablet Take 1 tablet by mouth every 6 (six) hours as needed for pain.    OXYGEN Inhale 3 L into the lungs continuous.    potassium chloride SA (KLOR-CON) 20 MEQ tablet Take 2 tablets (40 mEq total) by mouth 2 (two) times daily. (Patient taking differently: Take 40 mEq by mouth daily.)    torsemide (DEMADEX) 20 MG tablet Take 3 tablets (60 mg total) by mouth 2 (two) times daily. (Patient taking differently: Take 50 mg by mouth 2 (two) times daily.)    TRUEplus Lancets 30G MISC Check BS daily Dx E11.9    vitamin B-12 (CYANOCOBALAMIN) 1000 MCG tablet Take 2,000 mcg by mouth daily.    [DISCONTINUED] albuterol (PROVENTIL HFA;VENTOLIN HFA) 108 (90 Base) MCG/ACT inhaler Inhale 2 puffs into the lungs every 6 (six) hours as needed for wheezing or shortness of breath.    No facility-administered encounter medications on file as of 03/31/2021.    ALLERGIES:  Allergies  Allergen Reactions   Keflex [Cephalexin] Nausea And Vomiting     PHYSICAL EXAM:  ECOG PERFORMANCE STATUS: 2 - Symptomatic, <50% confined to bed  There were no vitals filed for this visit. There were no vitals filed for this visit. Physical Exam Constitutional:      Appearance: Normal appearance. She is obese.  HENT:     Head: Normocephalic and atraumatic.      Mouth/Throat:     Mouth: Mucous membranes are moist.  Eyes:     Extraocular Movements: Extraocular movements intact.     Pupils: Pupils are equal, round, and reactive to light.  Cardiovascular:     Rate and Rhythm: Normal rate and regular rhythm.     Pulses: Normal pulses.     Heart sounds: Murmur heard.  Pulmonary:     Effort: Pulmonary effort is normal.     Breath sounds: Normal breath sounds.  Abdominal:     General: Bowel sounds are normal.     Palpations: Abdomen is soft.     Tenderness: There is no abdominal tenderness.  Musculoskeletal:        General: No swelling.     Right lower leg: Edema (3+ pretibial pitting edema) present.     Left lower leg: Edema (3+ pretibial pitting edema) present.  Lymphadenopathy:     Cervical: No cervical adenopathy.  Skin:    General: Skin is warm and dry.  Neurological:     General: No focal deficit present.     Mental Status: She is alert and oriented to person, place, and time.  Psychiatric:        Mood and Affect: Mood normal.        Behavior: Behavior normal.     LABORATORY DATA:  I have reviewed the labs as listed.  CBC    Component Value Date/Time   WBC 3.9 (L) 03/24/2021 1006   RBC 3.11 (L) 03/24/2021 1006   HGB 7.5 (L) 03/24/2021 1006   HGB 9.1 (L) 02/26/2021 1129   HCT 26.7 (L) 03/24/2021 1006   HCT 30.5 (L) 02/26/2021 1129   PLT 89 (L) 03/24/2021 1006   PLT 104 (L) 02/26/2021 1129  MCV 85.9 03/24/2021 1006   MCV 87 02/26/2021 1129   MCH 24.1 (L) 03/24/2021 1006   MCHC 28.1 (L) 03/24/2021 1006   RDW 18.0 (H) 03/24/2021 1006   RDW 16.4 (H) 02/26/2021 1129   LYMPHSABS 1.0 03/24/2021 1006   LYMPHSABS 1.8 06/24/2020 1529   MONOABS 0.4 03/24/2021 1006   EOSABS 0.1 03/24/2021 1006   EOSABS 0.1 06/24/2020 1529   BASOSABS 0.0 03/24/2021 1006   BASOSABS 0.0 06/24/2020 1529   CMP Latest Ref Rng & Units 03/24/2021 12/04/2020 11/13/2020  Glucose 70 - 99 mg/dL 150(H) 172(H) 207(H)  BUN 8 - 23 mg/dL _0 Creatinine 0.44 - 1.00 mg/dL 0.62 0.63 0.67  Sodium 135 - 145 mmol/L 137 137 137  Potassium 3.5 - 5.1 mmol/L 3.7 3.8 3.6  Chloride 98 - 111 mmol/L 104 103 101  CO2 22 - 32 mmol/L _1 Calcium 8.9 - 10.3 mg/dL 8.5(L) 8.3(L) 8.9  Total Protein 6.5 - 8.1 g/dL 8.1 7.7 8.0  Total Bilirubin 0.3 - 1.2 mg/dL 0.1(L) 0.4 0.2(L)  Alkaline Phos 38 - 126 U/L 82 86 99  AST 15 - 41 U/L _2 ALT 0 - 44 U/L _3 DIAGNOSTIC IMAGING:  I have independently reviewed the relevant imaging and discussed with the patient.  ASSESSMENT & PLAN: 1.  Iron deficiency anemia in the setting of GI blood loss - She has had three hospital admissions for GI bleeding in the past year (October 2021, December 2021, February 2022) - Hgb was as low as 3.7 (01/22/2020), and she has had 10+ blood transfusions in the past year.  Prior to GI bleeding in October 2021, her hemoglobin was within normal range. - Most recent PRBC transfusion was on 12/11/2020 - Extensive endoscopic work-up, but source of chronic anemia has yet to be identified.   Last EGD (07/28/2020) showed grade 1 esophageal varices and gastritis, as well as prominent blood vessels at the base of the tongue and underneath the tongue (question lingual varicosities) Capsule endoscopy (07/28/2020) did not show any obvious lesions, erosions, or overt GI bleeding Last colonoscopy (January 2022) showed nonbleeding internal hemorrhoids, diverticulosis in the sigmoid and descending colon, and polypectomy. Referral to [facility] was sent for evaluation of lingual varicosities, but patient declined.  (Per conversation with [facility], no real treatment is available for lingual varicosities.) - Failed to improve with oral iron supplementation and has required several courses of IV iron - She denies any overt GI blood loss at this time, but has previously had episodes of apparent melena.  Single episode of hematuria has not recurred. - Symptomatic with severe fatigue and  progressive dyspnea on exertion - Iron panel last week (03/24/2021) showed ferritin 5, iron saturation 5%, elevated TIBC 451, low serum iron 24 - CBC today (03/31/2021): Hgb 7.2 - PLAN: Recommend PRBC x1 unit due to symptomatic anemia.  (Scheduled for Friday, 04/02/2021) - IV iron with Venofer 300 mg x 4 doses (first dose given 03/24/2021, second dose today) - Monthly CBC due to high risk for recurrent GI bleeding (with sample to blood bank) - Follow-up visit with repeat CBC/iron panel in 3 months  - Patient instructed to follow-up with her established GI provider (Dr. Abbey Chatters), since referral for second opinion (patient request) was refused by multiple other gastroenterologists. - Could consider referral to oral surgery to evaluate lingual varicosities if GI work-up is negative. - Patient is aware of alarm symptoms that prompt immediate  medical attention at the emergency department.   2.  Smoldering myeloma, IgG kappa - Initially diagnosed during work-up for anemia in April 2022. - Immunofixation shows monoclonal IgG kappa, initial M spike 2.6 (07/09/2020), initial light chain ratio 4.17 (07/09/2020) - Skeletal survey (07/16/2020):  No lytic lesions - PET scan (08/04/2020): negative for any focal hypermetabolic activity in the skeleton, but did show mild diffuse marrow activity which is likely anemia marrow response after being given IV iron; no soft tissue plasmacytoma; no lytic lesions on CT portion of exam - Bone marrow biopsy (08/04/2020): Plasma cells 18%; hypercellular bone marrow with trilineage hematopoiesis, no significant dyspoiesis or increase in blastic cells - FISH panel for plasma cell disorders showed no evidence of abnormalities - Cytogenics unable to be completed, as cultures from submitted sample failed to yield any metaphase cells - No CRAB features: Since bone marrow biopsy showed adequate erythropoiesis, patient's persistent anemia is due to blood loss as addressed above.  Calcium and  creatinine within normal limits.  Most recent CMP (03/24/2021) with calcium 8.5 and creatinine 0.62.  Most recent hemoglobin (03/31/2021) 7.2, but related to GI blood loss as above. - Per Mayo 20/20/2 Score, patient is intermediate risk (1/3 criteria met); IMWG Score of 5 (low-intermediate risk) - NO TREATMENT INDICATED AT THIS TIME - Most myeloma panel (03/24/2021): Stable M spike 2.4.  Free light chain ratio stable at 4.03 with elevated kappa 72.1 and normal lambda 17.9.  LDH normal. - No new bone pain or B symptoms.   - PLAN: Myeloma panel is stable, will repeat in 3 months (December 2022). - Skeletal survey in 3 months - RTC in 3 months for follow-up after repeat labs.     3.  Mild thrombocytopenia and leukopenia, secondary to cirrhosis - Most recent CBC (03/31/2021) with normal WBC 4.4 with normal differential, platelets at baseline at 85 - B12 deficiency has been treated as below;  folate and copper normal when last checked - Suspect thrombocytopenia and leukopenia secondary to underlying cirrhosis - Admits to easy bruising.  Denies any signs of petechial rash.  No recurrent infections   - PLAN: Continue to monitor CBCs.  No active intervention needed at this time.   4.  Vitamin B12 deficiency - B12 remained low with elevated methylmalonic acid despite oral supplementation; patient was started on monthly B12 injections - Most recent B12 (03/24/2021) normal at 385 with normal methylmalonic acid - PLAN: Continue monthly B12 injections.  STOP oral B12 supplementation at home.  Recheck B12/methylmalonic acid in 6 months (June 2023).   5.  Congestive heart failure  - Mild fluid overload noted - patient reports that she did not take her Lasix for the past several weeks. - PLAN: Educated patient on compliance with diuretics, informed of alarm symptoms that would warrant presentation to ED.   PLAN SUMMARY & DISPOSITION: Blood transfusion (PRBC x1) on Friday, 04/02/2021 Additional IV Venofer 300 mg  x 2 doses Monthly CBC Monthly B12 injection Full lab panel in 3 months (CBC, iron panel, myeloma panel) Skeletal survey in 3 months RTC in 3 months after lab panel/skeletal survey Follow-up recommended with GI (Dr. Abbey Chatters)  All questions were answered. The patient knows to call the clinic with any problems, questions or concerns.  Medical decision making: Moderate  Time spent on visit: I spent 20 minutes counseling the patient face to face. The total time spent in the appointment was 30 minutes and more than 50% was on counseling.   Harriett Rush, PA-C  03/31/2021 3:27 PM

## 2021-03-31 ENCOUNTER — Other Ambulatory Visit: Payer: Self-pay

## 2021-03-31 ENCOUNTER — Inpatient Hospital Stay (HOSPITAL_COMMUNITY): Payer: Medicare PPO | Attending: Physician Assistant | Admitting: Physician Assistant

## 2021-03-31 ENCOUNTER — Inpatient Hospital Stay (HOSPITAL_COMMUNITY): Payer: Medicare PPO

## 2021-03-31 ENCOUNTER — Ambulatory Visit (HOSPITAL_COMMUNITY): Payer: Medicare PPO

## 2021-03-31 ENCOUNTER — Other Ambulatory Visit (HOSPITAL_COMMUNITY): Payer: Medicare PPO

## 2021-03-31 VITALS — BP 129/54 | HR 83 | Temp 97.6°F | Resp 18 | Ht 62.6 in | Wt 291.0 lb

## 2021-03-31 DIAGNOSIS — C9000 Multiple myeloma not having achieved remission: Secondary | ICD-10-CM | POA: Diagnosis not present

## 2021-03-31 DIAGNOSIS — Z800 Family history of malignant neoplasm of digestive organs: Secondary | ICD-10-CM | POA: Insufficient documentation

## 2021-03-31 DIAGNOSIS — E119 Type 2 diabetes mellitus without complications: Secondary | ICD-10-CM | POA: Diagnosis not present

## 2021-03-31 DIAGNOSIS — E538 Deficiency of other specified B group vitamins: Secondary | ICD-10-CM | POA: Diagnosis not present

## 2021-03-31 DIAGNOSIS — E785 Hyperlipidemia, unspecified: Secondary | ICD-10-CM | POA: Diagnosis not present

## 2021-03-31 DIAGNOSIS — R0602 Shortness of breath: Secondary | ICD-10-CM | POA: Diagnosis not present

## 2021-03-31 DIAGNOSIS — I10 Essential (primary) hypertension: Secondary | ICD-10-CM | POA: Insufficient documentation

## 2021-03-31 DIAGNOSIS — R5383 Other fatigue: Secondary | ICD-10-CM | POA: Insufficient documentation

## 2021-03-31 DIAGNOSIS — Z79899 Other long term (current) drug therapy: Secondary | ICD-10-CM | POA: Insufficient documentation

## 2021-03-31 DIAGNOSIS — D472 Monoclonal gammopathy: Secondary | ICD-10-CM

## 2021-03-31 DIAGNOSIS — Z85828 Personal history of other malignant neoplasm of skin: Secondary | ICD-10-CM | POA: Diagnosis not present

## 2021-03-31 DIAGNOSIS — Z87442 Personal history of urinary calculi: Secondary | ICD-10-CM | POA: Insufficient documentation

## 2021-03-31 DIAGNOSIS — R918 Other nonspecific abnormal finding of lung field: Secondary | ICD-10-CM | POA: Diagnosis not present

## 2021-03-31 DIAGNOSIS — I8500 Esophageal varices without bleeding: Secondary | ICD-10-CM | POA: Insufficient documentation

## 2021-03-31 DIAGNOSIS — Z87891 Personal history of nicotine dependence: Secondary | ICD-10-CM | POA: Diagnosis not present

## 2021-03-31 DIAGNOSIS — D509 Iron deficiency anemia, unspecified: Secondary | ICD-10-CM | POA: Diagnosis not present

## 2021-03-31 DIAGNOSIS — D649 Anemia, unspecified: Secondary | ICD-10-CM | POA: Diagnosis not present

## 2021-03-31 DIAGNOSIS — D500 Iron deficiency anemia secondary to blood loss (chronic): Secondary | ICD-10-CM

## 2021-03-31 DIAGNOSIS — D696 Thrombocytopenia, unspecified: Secondary | ICD-10-CM | POA: Insufficient documentation

## 2021-03-31 DIAGNOSIS — I509 Heart failure, unspecified: Secondary | ICD-10-CM | POA: Insufficient documentation

## 2021-03-31 LAB — CBC WITH DIFFERENTIAL/PLATELET
Abs Immature Granulocytes: 0.02 10*3/uL (ref 0.00–0.07)
Basophils Absolute: 0 10*3/uL (ref 0.0–0.1)
Basophils Relative: 0 %
Eosinophils Absolute: 0.1 10*3/uL (ref 0.0–0.5)
Eosinophils Relative: 1 %
HCT: 25.2 % — ABNORMAL LOW (ref 36.0–46.0)
Hemoglobin: 7.2 g/dL — ABNORMAL LOW (ref 12.0–15.0)
Immature Granulocytes: 1 %
Lymphocytes Relative: 29 %
Lymphs Abs: 1.3 10*3/uL (ref 0.7–4.0)
MCH: 25.5 pg — ABNORMAL LOW (ref 26.0–34.0)
MCHC: 28.6 g/dL — ABNORMAL LOW (ref 30.0–36.0)
MCV: 89.4 fL (ref 80.0–100.0)
Monocytes Absolute: 0.4 10*3/uL (ref 0.1–1.0)
Monocytes Relative: 10 %
Neutro Abs: 2.6 10*3/uL (ref 1.7–7.7)
Neutrophils Relative %: 59 %
Platelets: 85 10*3/uL — ABNORMAL LOW (ref 150–400)
RBC: 2.82 MIL/uL — ABNORMAL LOW (ref 3.87–5.11)
RDW: 21.6 % — ABNORMAL HIGH (ref 11.5–15.5)
WBC: 4.4 10*3/uL (ref 4.0–10.5)
nRBC: 0 % (ref 0.0–0.2)

## 2021-03-31 LAB — SAMPLE TO BLOOD BANK

## 2021-03-31 LAB — PREPARE RBC (CROSSMATCH)

## 2021-03-31 MED ORDER — SODIUM CHLORIDE 0.9 % IV SOLN: INTRAVENOUS | Status: DC

## 2021-03-31 MED ORDER — SODIUM CHLORIDE 0.9 % IV SOLN
300.0000 mg | Freq: Once | INTRAVENOUS | Status: AC
  Administered 2021-03-31: 300 mg via INTRAVENOUS
  Filled 2021-03-31: qty 15

## 2021-03-31 MED ORDER — LORATADINE 10 MG PO TABS
10.0000 mg | ORAL_TABLET | Freq: Once | ORAL | Status: AC
  Administered 2021-03-31: 10 mg via ORAL
  Filled 2021-03-31: qty 1

## 2021-03-31 NOTE — Patient Instructions (Signed)
Savannah Burns  Discharge Instructions: Thank you for choosing [facility] to provide your oncology and hematology care.  If you have a lab appointment with the [facility], please come in thru the Main Entrance and check in at the main information desk.  Wear comfortable clothing and clothing appropriate for easy access to any Portacath or PICC line.   We strive to give you quality time with your provider. You may need to reschedule your appointment if you arrive late (15 or more minutes).  Arriving late affects you and other patients whose appointments are after yours.  Also, if you miss three or more appointments without notifying the office, you may be dismissed from the clinic at the providers discretion.      For prescription refill requests, have your pharmacy contact our office and allow 72 hours for refills to be completed.    Today you received the following venofer infusion, return as scheduled.   To help prevent nausea and vomiting after your treatment, we encourage you to take your nausea medication as directed.  BELOW ARE SYMPTOMS THAT SHOULD BE REPORTED IMMEDIATELY: *FEVER GREATER THAN 100.4 F (38 C) OR HIGHER *CHILLS OR SWEATING *NAUSEA AND VOMITING THAT IS NOT CONTROLLED WITH YOUR NAUSEA MEDICATION *UNUSUAL SHORTNESS OF BREATH *UNUSUAL BRUISING OR BLEEDING *URINARY PROBLEMS (pain or burning when urinating, or frequent urination) *BOWEL PROBLEMS (unusual diarrhea, constipation, pain near the anus) TENDERNESS IN MOUTH AND THROAT WITH OR WITHOUT PRESENCE OF ULCERS (sore throat, sores in mouth, or a toothache) UNUSUAL RASH, SWELLING OR PAIN  UNUSUAL VAGINAL DISCHARGE OR ITCHING   Items with * indicate a potential emergency and should be followed up as soon as possible or go to the Emergency Department if any problems should occur.  Please show the CHEMOTHERAPY ALERT CARD or IMMUNOTHERAPY ALERT CARD at check-in to the Emergency Department and triage  nurse.  Should you have questions after your visit or need to cancel or reschedule your appointment, please contact [facility] [phone]  and follow the prompts.  Office hours are 8:00 a.m. to 4:30 p.m. Monday - Friday. Please note that voicemails left after 4:00 p.m. may not be returned until the following business day.  We are closed weekends and major holidays. You have access to a nurse at all times for urgent questions. Please call the main number to the clinic [phone] and follow the prompts.  For any non-urgent questions, you may also contact your provider using MyChart. We now offer e-Visits for anyone 37 and older to request care online for non-urgent symptoms. For details visit [email or web address].   Also download the MyChart app! Go to the app store, search "MyChart", open the app, select [facility], and log in with your MyChart username and password.  Due to Covid, a mask is required upon entering the hospital/clinic. If you do not have a mask, one will be given to you upon arrival. For doctor visits, patients may have 1 support person aged 72 or older with them. For treatment visits, patients cannot have anyone with them due to current Covid guidelines and our immunocompromised population.

## 2021-03-31 NOTE — Progress Notes (Signed)
Patient takes benadryl at home prior to iron infusion, patient given claritin at cancer center. Patient tolerated iron infusion with no complaints voiced. Peripheral IV site clean and dry with good blood return noted before and after infusion. Band aid applied. Patient's hemoglobin 7.2, blood transfusion ordered and appointment made for Friday per orders. VSS with discharge and left in satisfactory condition with no s/s of distress noted.

## 2021-03-31 NOTE — Patient Instructions (Signed)
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  IRON DEFICIENCY ANEMIA: Your hemoglobin and iron levels have continued to drop, which is suspicious for ongoing blood loss, possibly from your stomach/intestines. You are scheduled for a blood transfusion this Friday, 04/02/2020. We will schedule you for 2 additional IV iron infusions. We will check your CBC (blood count) every 4 weeks to make sure you are not having any sudden drops in your blood. If you experience any worsening symptoms or have signs of major blood loss, please seek immediate medical attention and proceed to the emergency department. Please make an appointment with GI (Dr. Abbey Chatters) for further work-up of the source of your bleeding.  You should discuss with him whether or not you would benefit from referral to tertiary center (such as [facility]) for more extensive endoscopy procedures.  SMOLDERING MYELOMA: Your labs are stable, and do not show any signs of progression to multiple myeloma at this time.   We will recheck these labs at your follow-up visit in 3 months.   We will also check another whole-body x-ray (skeletal survey) in 3 months to make sure we do not see any abnormal spots on your bones.  LABS: Monthly CBC (blood count).  Full lab panel in 3 months.  FOLLOW-UP APPOINTMENT: Office visit in 3 months, after labs, or sooner if needed based on symptoms.   Thank you for choosing [place] at [facility] to provide your oncology and hematology care.  To afford each patient quality time with our provider, please arrive at least 15 minutes before your scheduled appointment time.   If you have a lab appointment with the [facility] please come in thru the Main Entrance and check in at the main information desk.  You need to re-schedule your appointment should you arrive 10  or more minutes late.  We strive to give you quality time with our providers, and arriving late affects you and other patients whose appointments are after yours.  Also, if you no show three or more times for appointments you may be dismissed from the clinic at the providers discretion.     Again, thank you for choosing [facility].  Our hope is that these requests will decrease the amount of time that you wait before being seen by our physicians.       _____________________________________________________________  Should you have questions after your visit to [facility], please contact our office at [phone] and follow the prompts.  Our office hours are 8:00 a.m. and 4:30 p.m. Monday - Friday.  Please note that voicemails left after 4:00 p.m. may not be returned until the following business day.  We are closed weekends and major holidays.  You do have access to a nurse 24-7, just call the main number to the clinic [phone] and do not press any options, hold on the line and a nurse will answer the phone.    For prescription refill requests, have your pharmacy contact our office and allow 72 hours.    Due to Covid, you will need to wear a mask upon entering the hospital. If you do not have a mask, a mask will be given to you at the Main Entrance upon arrival. For doctor visits, patients may have 1 support person age 68 or older with them. For treatment visits, patients can not  have anyone with them due to social distancing guidelines and our immunocompromised population.

## 2021-04-01 DIAGNOSIS — G894 Chronic pain syndrome: Secondary | ICD-10-CM | POA: Diagnosis not present

## 2021-04-01 DIAGNOSIS — Z6841 Body Mass Index (BMI) 40.0 and over, adult: Secondary | ICD-10-CM | POA: Diagnosis not present

## 2021-04-01 DIAGNOSIS — F1120 Opioid dependence, uncomplicated: Secondary | ICD-10-CM | POA: Diagnosis not present

## 2021-04-01 DIAGNOSIS — Z79899 Other long term (current) drug therapy: Secondary | ICD-10-CM | POA: Diagnosis not present

## 2021-04-01 DIAGNOSIS — R030 Elevated blood-pressure reading, without diagnosis of hypertension: Secondary | ICD-10-CM | POA: Diagnosis not present

## 2021-04-01 DIAGNOSIS — M5136 Other intervertebral disc degeneration, lumbar region: Secondary | ICD-10-CM | POA: Diagnosis not present

## 2021-04-01 DIAGNOSIS — E119 Type 2 diabetes mellitus without complications: Secondary | ICD-10-CM | POA: Diagnosis not present

## 2021-04-02 ENCOUNTER — Other Ambulatory Visit: Payer: Self-pay

## 2021-04-02 ENCOUNTER — Inpatient Hospital Stay (HOSPITAL_COMMUNITY): Payer: Medicare PPO

## 2021-04-02 DIAGNOSIS — R0602 Shortness of breath: Secondary | ICD-10-CM | POA: Diagnosis not present

## 2021-04-02 DIAGNOSIS — Z79899 Other long term (current) drug therapy: Secondary | ICD-10-CM | POA: Diagnosis not present

## 2021-04-02 DIAGNOSIS — C9000 Multiple myeloma not having achieved remission: Secondary | ICD-10-CM | POA: Diagnosis not present

## 2021-04-02 DIAGNOSIS — R5383 Other fatigue: Secondary | ICD-10-CM | POA: Diagnosis not present

## 2021-04-02 DIAGNOSIS — E538 Deficiency of other specified B group vitamins: Secondary | ICD-10-CM | POA: Diagnosis not present

## 2021-04-02 DIAGNOSIS — I509 Heart failure, unspecified: Secondary | ICD-10-CM | POA: Diagnosis not present

## 2021-04-02 DIAGNOSIS — D509 Iron deficiency anemia, unspecified: Secondary | ICD-10-CM | POA: Diagnosis not present

## 2021-04-02 DIAGNOSIS — D696 Thrombocytopenia, unspecified: Secondary | ICD-10-CM | POA: Diagnosis not present

## 2021-04-02 DIAGNOSIS — E119 Type 2 diabetes mellitus without complications: Secondary | ICD-10-CM | POA: Diagnosis not present

## 2021-04-02 DIAGNOSIS — D500 Iron deficiency anemia secondary to blood loss (chronic): Secondary | ICD-10-CM

## 2021-04-02 MED ORDER — SODIUM CHLORIDE 0.9% IV SOLUTION
250.0000 mL | Freq: Once | INTRAVENOUS | Status: AC
  Administered 2021-04-02: 250 mL via INTRAVENOUS

## 2021-04-02 MED ORDER — ACETAMINOPHEN 325 MG PO TABS
650.0000 mg | ORAL_TABLET | Freq: Once | ORAL | Status: AC
  Administered 2021-04-02: 650 mg via ORAL
  Filled 2021-04-02: qty 2

## 2021-04-02 MED ORDER — DIPHENHYDRAMINE HCL 25 MG PO CAPS: 25.0000 mg | ORAL_CAPSULE | Freq: Once | ORAL | Status: DC

## 2021-04-02 NOTE — Progress Notes (Signed)
Patient presents today for blood transfusion.  Patient is in satisfactory condition with no new complaints voiced.  Vital signs are stable.  Last hemoglobin was on 03/31/2021 which was 7.2.  We will proceed with transfusion per MD orders.   Patient tolerated transfusion well with no complaints voiced.  Patient left via wheelchair with granddaughter in stable condition.  Vital signs stable at discharge.  Follow up as scheduled.

## 2021-04-02 NOTE — Patient Instructions (Signed)
[facility]  Discharge Instructions: Thank you for choosing [facility] to provide your oncology and hematology care.  If you have a lab appointment with the Cancer Center, please come in thru the Main Entrance and check in at the main information desk.  Wear comfortable clothing and clothing appropriate for easy access to any Portacath or PICC line.   We strive to give you quality time with your provider. You may need to reschedule your appointment if you arrive late (15 or more minutes).  Arriving late affects you and other patients whose appointments are after yours.  Also, if you miss three or more appointments without notifying the office, you may be dismissed from the clinic at the provider's discretion.      For prescription refill requests, have your pharmacy contact our office and allow 72 hours for refills to be completed.        To help prevent nausea and vomiting after your treatment, we encourage you to take your nausea medication as directed.  BELOW ARE SYMPTOMS THAT SHOULD BE REPORTED IMMEDIATELY: *FEVER GREATER THAN 100.4 F (38 C) OR HIGHER *CHILLS OR SWEATING *NAUSEA AND VOMITING THAT IS NOT CONTROLLED WITH YOUR NAUSEA MEDICATION *UNUSUAL SHORTNESS OF BREATH *UNUSUAL BRUISING OR BLEEDING *URINARY PROBLEMS (pain or burning when urinating, or frequent urination) *BOWEL PROBLEMS (unusual diarrhea, constipation, pain near the anus) TENDERNESS IN MOUTH AND THROAT WITH OR WITHOUT PRESENCE OF ULCERS (sore throat, sores in mouth, or a toothache) UNUSUAL RASH, SWELLING OR PAIN  UNUSUAL VAGINAL DISCHARGE OR ITCHING   Items with * indicate a potential emergency and should be followed up as soon as possible or go to the Emergency Department if any problems should occur.  Please show the CHEMOTHERAPY ALERT CARD or IMMUNOTHERAPY ALERT CARD at check-in to the Emergency Department and triage nurse.  Should you have questions after your visit or need to cancel  or reschedule your appointment, please contact [facility] [phone]  and follow the prompts.  Office hours are 8:00 a.m. to 4:30 p.m. Monday - Friday. Please note that voicemails left after 4:00 p.m. may not be returned until the following business day.  We are closed weekends and major holidays. You have access to a nurse at all times for urgent questions. Please call the main number to the clinic [phone] and follow the prompts.  For any non-urgent questions, you may also contact your provider using MyChart. We now offer e-Visits for anyone 18 and older to request care online for non-urgent symptoms. For details visit [email or web address].   Also download the MyChart app! Go to the app store, search "MyChart", open the app, select [place], and log in with your MyChart username and password.  Due to Covid, a mask is required upon entering the hospital/clinic. If you do not have a mask, one will be given to you upon arrival. For doctor visits, patients may have 1 support person aged 18 or older with them. For treatment visits, patients cannot have anyone with them due to current Covid guidelines and our immunocompromised population.  

## 2021-04-02 NOTE — Telephone Encounter (Signed)
Savannah Burns:  This pt turned in questionnaire.  Has she or is she being discharged?  I want to make sure before proceeding any further?

## 2021-04-04 LAB — TYPE AND SCREEN
ABO/RH(D): A POS
Antibody Screen: NEGATIVE
Unit division: 0

## 2021-04-04 LAB — BPAM RBC
Blood Product Expiration Date: 202301242359
ISSUE DATE / TIME: 202301061012
Unit Type and Rh: 6200

## 2021-04-05 ENCOUNTER — Telehealth: Payer: Medicare PPO

## 2021-04-05 ENCOUNTER — Encounter (INDEPENDENT_AMBULATORY_CARE_PROVIDER_SITE_OTHER): Payer: Self-pay | Admitting: *Deleted

## 2021-04-05 NOTE — Progress Notes (Deleted)
After review of your chart we realize you had wanted to terminate our patient/physician relationship back in October 2022.  This letter is sent to confirm and terminate our patient/physician relationship from your recent desire and decision to discontinue care with our practice here at [facility].    Your medical condition requires physician supervision, and we understand your decision and wish you well on your selection with another provider elsewhere to handle your medical needs. I will be available to you until 05/05/2021.  Please, contact your health insurance company or the [facility] for the names of other physicians that are in this specialty for your care. Upon written authorization, a copy of your medical record will be sent to your new physician. A medical record release form is enclosed.

## 2021-04-06 ENCOUNTER — Encounter: Payer: Self-pay | Admitting: Internal Medicine

## 2021-04-06 ENCOUNTER — Encounter (INDEPENDENT_AMBULATORY_CARE_PROVIDER_SITE_OTHER): Payer: Self-pay | Admitting: *Deleted

## 2021-04-07 ENCOUNTER — Other Ambulatory Visit: Payer: Self-pay

## 2021-04-07 ENCOUNTER — Inpatient Hospital Stay (HOSPITAL_COMMUNITY): Payer: Medicare PPO

## 2021-04-07 VITALS — BP 124/63 | HR 79 | Temp 98.2°F | Resp 18 | Ht 62.5 in | Wt 288.2 lb

## 2021-04-07 DIAGNOSIS — I509 Heart failure, unspecified: Secondary | ICD-10-CM | POA: Diagnosis not present

## 2021-04-07 DIAGNOSIS — C9000 Multiple myeloma not having achieved remission: Secondary | ICD-10-CM | POA: Diagnosis not present

## 2021-04-07 DIAGNOSIS — R0602 Shortness of breath: Secondary | ICD-10-CM | POA: Diagnosis not present

## 2021-04-07 DIAGNOSIS — D696 Thrombocytopenia, unspecified: Secondary | ICD-10-CM | POA: Diagnosis not present

## 2021-04-07 DIAGNOSIS — E119 Type 2 diabetes mellitus without complications: Secondary | ICD-10-CM | POA: Diagnosis not present

## 2021-04-07 DIAGNOSIS — D500 Iron deficiency anemia secondary to blood loss (chronic): Secondary | ICD-10-CM

## 2021-04-07 DIAGNOSIS — R5383 Other fatigue: Secondary | ICD-10-CM | POA: Diagnosis not present

## 2021-04-07 DIAGNOSIS — E538 Deficiency of other specified B group vitamins: Secondary | ICD-10-CM | POA: Diagnosis not present

## 2021-04-07 DIAGNOSIS — Z79899 Other long term (current) drug therapy: Secondary | ICD-10-CM | POA: Diagnosis not present

## 2021-04-07 DIAGNOSIS — D509 Iron deficiency anemia, unspecified: Secondary | ICD-10-CM | POA: Diagnosis not present

## 2021-04-07 MED ORDER — SODIUM CHLORIDE 0.9 % IV SOLN
300.0000 mg | Freq: Once | INTRAVENOUS | Status: AC
  Administered 2021-04-07: 300 mg via INTRAVENOUS
  Filled 2021-04-07: qty 300

## 2021-04-07 MED ORDER — SODIUM CHLORIDE 0.9 % IV SOLN: Freq: Once | INTRAVENOUS | Status: AC

## 2021-04-07 NOTE — Progress Notes (Signed)
Patient presents today for Venofer 300 mg. Patient states she took her pre-medication of Claritin 10 mgs prior to arrival at 10:15 am. Vital signs stable. Patient has no complaints of any changes since her last visit.   Venofer 300 mgs given today per MD orders. Tolerated infusion without adverse affects. Vital signs stable. No complaints at this time. Discharged from clinic by wheel chair in stable condition. Alert and oriented x 3. F/U with [facility] as scheduled.

## 2021-04-07 NOTE — Patient Instructions (Signed)
[place]  Discharge Instructions: Thank you for choosing [facility] to provide your oncology and hematology care.  If you have a lab appointment with the [facility], please come in thru the Main Entrance and check in at the main information desk.  Wear comfortable clothing and clothing appropriate for easy access to any Portacath or PICC line.   We strive to give you quality time with your provider. You may need to reschedule your appointment if you arrive late (15 or more minutes).  Arriving late affects you and other patients whose appointments are after yours.  Also, if you miss three or more appointments without notifying the office, you may be dismissed from the clinic at the providers discretion.      For prescription refill requests, have your pharmacy contact our office and allow 72 hours for refills to be completed.    Today you received the following : Venofer 300 mg.       To help prevent nausea and vomiting after your treatment, we encourage you to take your nausea medication as directed.  BELOW ARE SYMPTOMS THAT SHOULD BE REPORTED IMMEDIATELY: *FEVER GREATER THAN 100.4 F (38 C) OR HIGHER *CHILLS OR SWEATING *NAUSEA AND VOMITING THAT IS NOT CONTROLLED WITH YOUR NAUSEA MEDICATION *UNUSUAL SHORTNESS OF BREATH *UNUSUAL BRUISING OR BLEEDING *URINARY PROBLEMS (pain or burning when urinating, or frequent urination) *BOWEL PROBLEMS (unusual diarrhea, constipation, pain near the anus) TENDERNESS IN MOUTH AND THROAT WITH OR WITHOUT PRESENCE OF ULCERS (sore throat, sores in mouth, or a toothache) UNUSUAL RASH, SWELLING OR PAIN  UNUSUAL VAGINAL DISCHARGE OR ITCHING   Items with * indicate a potential emergency and should be followed up as soon as possible or go to the Emergency Department if any problems should occur.  Please show the CHEMOTHERAPY ALERT CARD or IMMUNOTHERAPY ALERT CARD at check-in to the Emergency Department and triage nurse.  Should  you have questions after your visit or need to cancel or reschedule your appointment, please contact [facility] [phone]  and follow the prompts.  Office hours are 8:00 a.m. to 4:30 p.m. Monday - Friday. Please note that voicemails left after 4:00 p.m. may not be returned until the following business day.  We are closed weekends and major holidays. You have access to a nurse at all times for urgent questions. Please call the main number to the clinic [phone] and follow the prompts.  For any non-urgent questions, you may also contact your provider using MyChart. We now offer e-Visits for anyone 72 and older to request care online for non-urgent symptoms. For details visit [email or web address].   Also download the MyChart app! Go to the app store, search "MyChart", open the app, select [place], and log in with your MyChart username and password.  Due to Covid, a mask is required upon entering the hospital/clinic. If you do not have a mask, one will be given to you upon arrival. For doctor visits, patients may have 1 support person aged 72 or older with them. For treatment visits, patients cannot have anyone with them due to current Covid guidelines and our immunocompromised population.

## 2021-04-12 ENCOUNTER — Telehealth: Payer: Medicare PPO

## 2021-04-14 ENCOUNTER — Ambulatory Visit: Payer: Medicare PPO | Admitting: Family Medicine

## 2021-04-14 ENCOUNTER — Telehealth: Payer: Self-pay | Admitting: Pharmacist

## 2021-04-14 VITALS — BP 148/78 | HR 87 | Temp 98.1°F | Ht 62.5 in | Wt 287.6 lb

## 2021-04-14 DIAGNOSIS — K760 Fatty (change of) liver, not elsewhere classified: Secondary | ICD-10-CM | POA: Diagnosis not present

## 2021-04-14 DIAGNOSIS — R195 Other fecal abnormalities: Secondary | ICD-10-CM

## 2021-04-14 DIAGNOSIS — D500 Iron deficiency anemia secondary to blood loss (chronic): Secondary | ICD-10-CM | POA: Diagnosis not present

## 2021-04-14 DIAGNOSIS — E1169 Type 2 diabetes mellitus with other specified complication: Secondary | ICD-10-CM | POA: Diagnosis not present

## 2021-04-14 DIAGNOSIS — H00012 Hordeolum externum right lower eyelid: Secondary | ICD-10-CM

## 2021-04-14 DIAGNOSIS — Z86010 Personal history of colonic polyps: Secondary | ICD-10-CM

## 2021-04-14 DIAGNOSIS — F339 Major depressive disorder, recurrent, unspecified: Secondary | ICD-10-CM | POA: Diagnosis not present

## 2021-04-14 MED ORDER — DESVENLAFAXINE SUCCINATE ER 50 MG PO TB24: 50.0000 mg | ORAL_TABLET | Freq: Every day | ORAL | 3 refills | Status: DC

## 2021-04-14 MED ORDER — ERYTHROMYCIN 5 MG/GM OP OINT: 1.0000 "application " | TOPICAL_OINTMENT | Freq: Every day | OPHTHALMIC | 1 refills | Status: DC

## 2021-04-14 NOTE — Progress Notes (Signed)
Subjective: CC: History of GI bleed, colonic polyps PCP: [name], DO PJA:[id number] Savannah Burns is a 71 y.o. female presenting to clinic today for:  1.  History of GI bleed, colonic polyps Patient is required several transfusions for insidious bleed.  She has been found to have multiple polyps on multiple colonoscopies.  She would like to see a gastroenterologist at an academic center.  She "wants the best of the best".  She voices frustration that her colonoscopies continue to reveal colonic polyps.  2.  Depression Patient reports improvement in both depression and anxiety with the Pristiq.  She feels satisfied with current dose and voices no concerning symptoms or signs related to the medication  3.  Stye Patient reports that she started developing a stye on the right lower lid recently.  She has had this in the past and wonders what she should be doing for this.  She has been washing her eyelids with baby shampoo but she continues to get them.  She has erythromycin but is nearly out and would like a renewal on this.  Not currently utilizing warm compresses on the eye.   ROS: Per HPI  Allergies  Allergen Reactions   Keflex [Cephalexin] Nausea And Vomiting   Past Medical History:  Diagnosis Date   Adrenal adenoma, left    Stable   Anxiety    Arthritis    bilateral hands   Depression    Diabetes mellitus, type 2 ([place]) 08/12/2008   Qualifier: Diagnosis of  By: Deborra Medina MD, Talia     Dyspnea    Esophageal varices (HCC)    Grade II diastolic dysfunction    History of kidney stones    Hyperlipidemia    Hypertension    Lower back pain    Lower GI bleed 03/19/2020   Panic attacks    Pneumonia    currently taking antibiotic and prednisone for early stages of pneumonia   Pulmonary nodules    bilateral   Skin cancer    face    Current Outpatient Medications:    Blood Glucose Calibration (GLUCOMETER DEX HIGH CONTROL) LIQD, Test CBG's once daily. , Disp: , Rfl:    Blood  Glucose Monitoring Suppl (TRUE METRIX AIR GLUCOSE METER) w/Device KIT, Check BS daily Dx E11.9, Disp: 1 kit, Rfl: 0   Cholecalciferol (VITAMIN D) 50 MCG (2000 UT) tablet, Take 4,000 Units by mouth daily., Disp: , Rfl:    desvenlafaxine (PRISTIQ) 50 MG 24 hr tablet, Take 1 tablet (50 mg total) by mouth daily., Disp: 30 tablet, Rfl: 2   erythromycin ophthalmic ointment, Place 1 application into the left eye at bedtime., Disp: 3.5 g, Rfl: 1   esomeprazole (NEXIUM) 20 MG capsule, Take 1 capsule (20 mg total) by mouth daily at 12 noon., Disp: 90 capsule, Rfl: 3   furosemide (LASIX) 20 MG tablet, Take 20 mg by mouth. bid, Disp: , Rfl:    glucose blood (TRUE METRIX BLOOD GLUCOSE TEST) test strip, Check BS daily Dx E11.9, Disp: 100 each, Rfl: 3   lovastatin (MEVACOR) 20 MG tablet, Take 1 tablet (20 mg total) by mouth at bedtime., Disp: 90 tablet, Rfl: 3   metFORMIN (GLUCOPHAGE) 1000 MG tablet, Take 1 tablet (1,000 mg total) by mouth daily with breakfast., Disp: 90 tablet, Rfl: 3   nadolol (CORGARD) 20 MG tablet, Take 1 tablet (20 mg total) by mouth daily., Disp: 30 tablet, Rfl: 3   nystatin cream (MYCOSTATIN), Apply 1 application topically 2 (two) times daily.  X14 days or until rash resolved, Disp: 60 g, Rfl: 1   oxyCODONE-acetaminophen (PERCOCET) 10-325 MG tablet, Take 1 tablet by mouth every 6 (six) hours as needed for pain., Disp: , Rfl:    OXYGEN, Inhale 3 L into the lungs continuous., Disp: , Rfl:    potassium chloride SA (KLOR-CON) 20 MEQ tablet, Take 2 tablets (40 mEq total) by mouth 2 (two) times daily. (Patient taking differently: Take 40 mEq by mouth daily.), Disp: 360 tablet, Rfl: 3   TRUEplus Lancets 30G MISC, Check BS daily Dx E11.9, Disp: 100 each, Rfl: 3   vitamin B-12 (CYANOCOBALAMIN) 1000 MCG tablet, Take 2,000 mcg by mouth daily., Disp: , Rfl:    ferrous sulfate 325 (65 FE) MG tablet, Take 1 tablet (325 mg total) by mouth 2 (two) times daily with a meal. (Patient taking differently: Take  325 mg by mouth daily with breakfast.), Disp: 60 tablet, Rfl: 1   torsemide (DEMADEX) 20 MG tablet, Take 3 tablets (60 mg total) by mouth 2 (two) times daily. (Patient taking differently: Take 50 mg by mouth 2 (two) times daily.), Disp: 180 tablet, Rfl: 11 Social History   Socioeconomic History   Marital status: Widowed    Spouse name: Not on file   Number of children: 2   Years of education: 14   Highest education level: Not on file  Occupational History   Occupation: Retired   Tobacco Use   Smoking status: Former    Packs/day: 1.50    Years: 40.00    Pack years: 60.00    Types: Cigarettes    Quit date: 04/29/2015    Years since quitting: 5.9   Smokeless tobacco: Never   Tobacco comments:    Quit smoking 04/2015- Previous 1.5 ppd smoker  Vaping Use   Vaping Use: Never used  Substance and Sexual Activity   Alcohol use: No    Alcohol/week: 0.0 standard drinks   Drug use: No   Sexual activity: Not Currently    Birth control/protection: Post-menopausal  Other Topics Concern   Not on file  Social History Narrative   Her 29 year old granddaughter lives with her - one daughter lives nearby, but she doesn't have a good relationship with her. Has a great relationship with other daughter who lives 1.5 hrs away - talks to her daily on the phone.   Social Determinants of Health   Financial Resource Strain: Low Risk    Difficulty of Paying Living Expenses: Not very hard  Food Insecurity: No Food Insecurity   Worried About Charity fundraiser in the Last Year: Never true   Ran Out of Food in the Last Year: Never true  Transportation Needs: No Transportation Needs   Lack of Transportation (Medical): No   Lack of Transportation (Non-Medical): No  Physical Activity: Inactive   Days of Exercise per Week: 0 days   Minutes of Exercise per Session: 0 min  Stress: Stress Concern Present   Feeling of Stress : Rather much  Social Connections: Socially Isolated   Frequency of Communication  with Friends and Family: More than three times a week   Frequency of Social Gatherings with Friends and Family: Once a week   Attends Religious Services: Never   Marine scientist or Organizations: No   Attends Archivist Meetings: Never   Marital Status: Widowed  Intimate Partner Violence: Not At Risk   Fear of Current or Ex-Partner: No   Emotionally Abused: No   Physically Abused:  No   Sexually Abused: No   Family History  Problem Relation Age of Onset   Diabetes Father    Heart disease Father 65       MI   Hypertension Father    Anemia Mother        Transfusion dependent   COPD Sister    Cancer Paternal Grandmother 73       Pancreatic    Objective: Office vital signs reviewed. BP (!) 148/78    Pulse 87    Temp 98.1 F (36.7 C)    Ht 5' 2.5" (1.588 m)    Wt 287 lb 9.6 oz (130.5 kg)    SpO2 93%    BMI 51.76 kg/m   Physical Examination:  General: Awake, alert, morbidly obese, No acute distress HEENT: Right lower lid with hordeolum noted.  There is no active drainage but there is a head to the hordeolum Cardio: regular rate and rhythm, S1S2 heard, no murmurs appreciated Pulm: clear to auscultation bilaterally, no wheezes, rhonchi or rales; normal work of breathing on room air MSK: Arrives in wheelchair today Psych: Mood stable, speech normal  Depression screen [facility] 2/9 04/14/2021 03/25/2021 02/26/2021  Decreased Interest _0 Down, Depressed, Hopeless 0 2 2  PHQ - 2 Score _1 Altered sleeping _2 Tired, decreased energy _3 Change in appetite _4 Feeling bad or failure about yourself  0 1 1  Trouble concentrating 0 1 2  Moving slowly or fidgety/restless 0 1 0  Suicidal thoughts 0 0 0  PHQ-9 Score _5 Difficult doing work/chores Somewhat difficult Somewhat difficult Very difficult  Some recent data might be hidden   GAD 7 : Generalized Anxiety Score 04/14/2021 02/26/2021 12/09/2020 12/05/2019  Nervous, Anxious, on Edge 1 3 0 1   Control/stop worrying 0 _6 Worry too much - different things 0 _7 Trouble relaxing 1 2 0 0  Restless 0 2 0 0  Easily annoyed or irritable 0 _8 Afraid - awful might happen 0 2 1 0  Total GAD 7 Score _9 Anxiety Difficulty Somewhat difficult Very difficult Somewhat difficult Somewhat difficult    Assessment/ Plan: 71 y.o. female   Iron deficiency anemia due to chronic blood loss - Plan: Ambulatory referral to Gastroenterology, AMB Referral to [place] [facility]  History of colon polyps - Plan: Ambulatory referral to Gastroenterology  Heme positive stool - Plan: Ambulatory referral to Gastroenterology, AMB Referral to [facility]  NAFLD (nonalcoholic fatty liver disease) - Plan: Ambulatory referral to Gastroenterology, AMB Referral to [facility]ordin[facility]  Hordeolum externum of right lower eyelid - Plan: erythromycin ophthalmic ointment  Depression, recurrent ([place]) - Plan: desvenlafaxine (PRISTIQ) 50 MG 24 hr tablet, AMB Referral to [facility]ordin[facility]  Type 2 diabetes mellitus with other specified complication, without long-term current use of insulin ([place]) - Plan: AMB Referral to [place]  Class 3 obesity ([place]) - Plan: AMB Referral to [facility]  Referral placed to [facility] gastroenterology.  Discussed there may be a wait.  We discussed that it is not uncommon for patients to develop colonic polyps, hence the urgency to have repeat colonoscopy is to closely follow.  However it sounds like she has been discharged from her current gastroenterologist in [place] so she will certainly need to establish care with an alternative.  Her depression and  anxiety seem to be improving with Pristiq.  This has been renewed  Pertaining to the stye on the right lower lid I have highly recommended warm compresses.  It does appear to have a head on it during exam today so I suspect that it will drain nicely and she  will notice some improvement in symptoms.  Erythromycin given for empiric treatment of infection  Additionally, I informed her that I would place referral to CCM for medication assistance.  She is very interested in starting a GLP to help with weight loss.  No orders of the defined types were placed in this encounter.  No orders of the defined types were placed in this encounter.    [name], DO [place] [phone]

## 2021-04-14 NOTE — Telephone Encounter (Signed)
MD requesting patient to be scheduled for patient assistance--just remind patient to bring most recent household financials And also ozempic is backordered and hard to get at times.  Patient assistance is taking up to 2 months.  We may work to get her rybelsus  1st available If patient can get financial forms to me now--I can process application sooner

## 2021-04-14 NOTE — Patient Instructions (Signed)
SEE Almyra Free, our pharmacist.   She can get you set up with Rybelsus or Ozempic to help with weight loss and diabetes.

## 2021-04-16 ENCOUNTER — Inpatient Hospital Stay (HOSPITAL_COMMUNITY): Payer: Medicare PPO

## 2021-04-16 ENCOUNTER — Other Ambulatory Visit: Payer: Self-pay

## 2021-04-16 ENCOUNTER — Telehealth: Payer: Self-pay

## 2021-04-16 ENCOUNTER — Encounter (HOSPITAL_COMMUNITY): Payer: Self-pay

## 2021-04-16 ENCOUNTER — Telehealth: Payer: Medicare PPO

## 2021-04-16 VITALS — BP 134/68 | HR 97 | Temp 98.6°F | Resp 20

## 2021-04-16 DIAGNOSIS — D509 Iron deficiency anemia, unspecified: Secondary | ICD-10-CM | POA: Diagnosis not present

## 2021-04-16 DIAGNOSIS — R0602 Shortness of breath: Secondary | ICD-10-CM | POA: Diagnosis not present

## 2021-04-16 DIAGNOSIS — D696 Thrombocytopenia, unspecified: Secondary | ICD-10-CM | POA: Diagnosis not present

## 2021-04-16 DIAGNOSIS — C9000 Multiple myeloma not having achieved remission: Secondary | ICD-10-CM | POA: Diagnosis not present

## 2021-04-16 DIAGNOSIS — R5383 Other fatigue: Secondary | ICD-10-CM | POA: Diagnosis not present

## 2021-04-16 DIAGNOSIS — I509 Heart failure, unspecified: Secondary | ICD-10-CM | POA: Diagnosis not present

## 2021-04-16 DIAGNOSIS — Z79899 Other long term (current) drug therapy: Secondary | ICD-10-CM | POA: Diagnosis not present

## 2021-04-16 DIAGNOSIS — E538 Deficiency of other specified B group vitamins: Secondary | ICD-10-CM | POA: Diagnosis not present

## 2021-04-16 DIAGNOSIS — E119 Type 2 diabetes mellitus without complications: Secondary | ICD-10-CM | POA: Diagnosis not present

## 2021-04-16 DIAGNOSIS — D500 Iron deficiency anemia secondary to blood loss (chronic): Secondary | ICD-10-CM

## 2021-04-16 MED ORDER — SODIUM CHLORIDE 0.9 % IV SOLN
300.0000 mg | Freq: Once | INTRAVENOUS | Status: AC
  Administered 2021-04-16: 300 mg via INTRAVENOUS
  Filled 2021-04-16: qty 300

## 2021-04-16 MED ORDER — SODIUM CHLORIDE 0.9 % IV SOLN: Freq: Once | INTRAVENOUS | Status: AC

## 2021-04-16 MED ORDER — LORATADINE 10 MG PO TABS: 10.0000 mg | ORAL_TABLET | Freq: Once | ORAL | Status: DC

## 2021-04-16 NOTE — Patient Instructions (Signed)
[facility]  Discharge Instructions: Thank you for choosing [facility] to provide your oncology and hematology care.  If you have a lab appointment with the Cancer Center, please come in thru the Main Entrance and check in at the main information desk.  Wear comfortable clothing and clothing appropriate for easy access to any Portacath or PICC line.   We strive to give you quality time with your provider. You may need to reschedule your appointment if you arrive late (15 or more minutes).  Arriving late affects you and other patients whose appointments are after yours.  Also, if you miss three or more appointments without notifying the office, you may be dismissed from the clinic at the provider's discretion.      For prescription refill requests, have your pharmacy contact our office and allow 72 hours for refills to be completed.        To help prevent nausea and vomiting after your treatment, we encourage you to take your nausea medication as directed.  BELOW ARE SYMPTOMS THAT SHOULD BE REPORTED IMMEDIATELY: *FEVER GREATER THAN 100.4 F (38 C) OR HIGHER *CHILLS OR SWEATING *NAUSEA AND VOMITING THAT IS NOT CONTROLLED WITH YOUR NAUSEA MEDICATION *UNUSUAL SHORTNESS OF BREATH *UNUSUAL BRUISING OR BLEEDING *URINARY PROBLEMS (pain or burning when urinating, or frequent urination) *BOWEL PROBLEMS (unusual diarrhea, constipation, pain near the anus) TENDERNESS IN MOUTH AND THROAT WITH OR WITHOUT PRESENCE OF ULCERS (sore throat, sores in mouth, or a toothache) UNUSUAL RASH, SWELLING OR PAIN  UNUSUAL VAGINAL DISCHARGE OR ITCHING   Items with * indicate a potential emergency and should be followed up as soon as possible or go to the Emergency Department if any problems should occur.  Please show the CHEMOTHERAPY ALERT CARD or IMMUNOTHERAPY ALERT CARD at check-in to the Emergency Department and triage nurse.  Should you have questions after your visit or need to cancel  or reschedule your appointment, please contact [facility] [phone]  and follow the prompts.  Office hours are 8:00 a.m. to 4:30 p.m. Monday - Friday. Please note that voicemails left after 4:00 p.m. may not be returned until the following business day.  We are closed weekends and major holidays. You have access to a nurse at all times for urgent questions. Please call the main number to the clinic [phone] and follow the prompts.  For any non-urgent questions, you may also contact your provider using MyChart. We now offer e-Visits for anyone 18 and older to request care online for non-urgent symptoms. For details visit [email or web address].   Also download the MyChart app! Go to the app store, search "MyChart", open the app, select [facility], and log in with your MyChart username and password.  Due to Covid, a mask is required upon entering the hospital/clinic. If you do not have a mask, one will be given to you upon arrival. For doctor visits, patients may have 1 support person aged 18 or older with them. For treatment visits, patients cannot have anyone with them due to current Covid guidelines and our immunocompromised population.  

## 2021-04-16 NOTE — Progress Notes (Signed)
Chaplain engaged in a follow-up visit with Savannah Burns who shared about her health currently, her family, and past.  Savannah Burns is happy about possibly going to [facility] soon to figure out why she has been losing blood.  She was finally released from her previous physician and is now finding hope in getting a second opinion.  Savannah Burns also spent some time talking about some grief and loss she has suffered in life.  She talked about her fear of being alone and not ever wanting to end up in a nursing home.  Savannah Burns has done a lot of recalling of her past, her family dynamics, and reconciling what her life is now. She has a desire to get back into church and have a community around her to support her.   Chaplain continues to offer listening, support and presence.    04/16/21 1000  Clinical Encounter Type  Visited With Patient  Visit Type Follow-up;Spiritual support

## 2021-04-16 NOTE — Progress Notes (Signed)
Patient took Claritin at home.  Pharmacy notified.   Patient tolerated iron infusion with no complaints voiced.  Peripheral IV site clean and dry with good blood return noted before and after infusion.  Band aid applied.  VSS with discharge and left in satisfactory condition with no s/s of distress noted.

## 2021-04-16 NOTE — Chronic Care Management (AMB) (Signed)
°  Chronic Care Management   Note  04/16/2021 Name: Savannah Burns MRN: [id number] DOB: 1951/03/17  Savannah Burns is a 71 y.o. year old female who is a primary care patient of [name], DO. Savannah Burns is currently enrolled in care management services. An additional referral for Pharm D  was placed.   Follow up plan: Unsuccessful telephone outreach attempt made. A HIPAA compliant phone message was left for the patient providing contact information and requesting a return call.  The care management team will reach out to the patient again over the next 7 days.  If patient returns call to provider office, please advise to call [facility]  at [address], [place], [place], [place] [address] Direct Dial: [phone] [email or web address] Website: [email or web address]

## 2021-04-19 ENCOUNTER — Ambulatory Visit (INDEPENDENT_AMBULATORY_CARE_PROVIDER_SITE_OTHER): Payer: Medicare PPO | Admitting: Licensed Clinical Social Worker

## 2021-04-19 DIAGNOSIS — F331 Major depressive disorder, recurrent, moderate: Secondary | ICD-10-CM

## 2021-04-19 DIAGNOSIS — J449 Chronic obstructive pulmonary disease, unspecified: Secondary | ICD-10-CM

## 2021-04-19 DIAGNOSIS — I152 Hypertension secondary to endocrine disorders: Secondary | ICD-10-CM

## 2021-04-19 DIAGNOSIS — E1169 Type 2 diabetes mellitus with other specified complication: Secondary | ICD-10-CM

## 2021-04-19 DIAGNOSIS — E785 Hyperlipidemia, unspecified: Secondary | ICD-10-CM

## 2021-04-19 DIAGNOSIS — I5032 Chronic diastolic (congestive) heart failure: Secondary | ICD-10-CM

## 2021-04-19 DIAGNOSIS — D500 Iron deficiency anemia secondary to blood loss (chronic): Secondary | ICD-10-CM

## 2021-04-19 DIAGNOSIS — E1159 Type 2 diabetes mellitus with other circulatory complications: Secondary | ICD-10-CM

## 2021-04-19 NOTE — Chronic Care Management (AMB) (Signed)
Chronic Care Management    Clinical Social Work Note  04/19/2021 Name: Savannah Burns MRN: [id number] DOB: 1950/11/01  Savannah Burns is a 71 y.o. year old female who is a primary care patient of [name], DO. The CCM team was consulted to assist the patient with chronic disease management and/or care coordination needs related to:  [employer] with patient by telephone for follow up visit in response to provider referral for social work chronic care management and care coordination services.   Consent to Services:  The patient was given information about Chronic Care Management services, agreed to services, and gave verbal consent prior to initiation of services.  Please see initial visit note for detailed documentation.   Patient agreed to services and consent obtained.   Assessment: Review of patient past medical history, allergies, medications, and health status, including review of relevant consultants reports was performed today as part of a comprehensive evaluation and provision of chronic care management and care coordination services.     SDOH (Social Determinants of Health) assessments and interventions performed:  SDOH Interventions    Flowsheet Row Most Recent Value  SDOH Interventions   Physical Activity Interventions Other (Comments)  [walking challenges.  uses a walker to help her ambulate]  Stress Interventions Provide Counseling  [client has stress related to managing medical needs]  Depression Interventions/Treatment  Counseling, Medication        Advanced Directives Status: See Vynca application for related entries.  CCM Care Plan  Allergies  Allergen Reactions   Keflex [Cephalexin] Nausea And Vomiting    Outpatient Encounter Medications as of 04/19/2021  Medication Sig Note   Blood Glucose Calibration (GLUCOMETER DEX HIGH CONTROL) LIQD Test CBG's once daily.     Blood Glucose Monitoring Suppl (TRUE METRIX AIR GLUCOSE  METER) w/Device KIT Check BS daily Dx E11.9    Cholecalciferol (VITAMIN D) 50 MCG (2000 UT) tablet Take 4,000 Units by mouth daily.    desvenlafaxine (PRISTIQ) 50 MG 24 hr tablet Take 1 tablet (50 mg total) by mouth daily.    erythromycin ophthalmic ointment Place 1 application into the left eye at bedtime.    esomeprazole (NEXIUM) 20 MG capsule Take 1 capsule (20 mg total) by mouth daily at 12 noon.    ferrous sulfate 325 (65 FE) MG tablet Take 1 tablet (325 mg total) by mouth 2 (two) times daily with a meal. (Patient taking differently: Take 325 mg by mouth daily with breakfast.)    furosemide (LASIX) 20 MG tablet Take 20 mg by mouth. bid    glucose blood (TRUE METRIX BLOOD GLUCOSE TEST) test strip Check BS daily Dx E11.9    lovastatin (MEVACOR) 20 MG tablet Take 1 tablet (20 mg total) by mouth at bedtime.    metFORMIN (GLUCOPHAGE) 1000 MG tablet Take 1 tablet (1,000 mg total) by mouth daily with breakfast.    nadolol (CORGARD) 20 MG tablet Take 1 tablet (20 mg total) by mouth daily. 08/04/2020: No longer taking   nystatin cream (MYCOSTATIN) Apply 1 application topically 2 (two) times daily. X14 days or until rash resolved    oxyCODONE-acetaminophen (PERCOCET) 10-325 MG tablet Take 1 tablet by mouth every 6 (six) hours as needed for pain.    OXYGEN Inhale 3 L into the lungs continuous.    potassium chloride SA (KLOR-CON) 20 MEQ tablet Take 2 tablets (40 mEq total) by mouth 2 (two) times daily. (Patient taking differently: Take 40 mEq by mouth daily.)  torsemide (DEMADEX) 20 MG tablet Take 3 tablets (60 mg total) by mouth 2 (two) times daily. (Patient taking differently: Take 50 mg by mouth 2 (two) times daily.)    TRUEplus Lancets 30G MISC Check BS daily Dx E11.9    vitamin B-12 (CYANOCOBALAMIN) 1000 MCG tablet Take 2,000 mcg by mouth daily.    [DISCONTINUED] albuterol (PROVENTIL HFA;VENTOLIN HFA) 108 (90 Base) MCG/ACT inhaler Inhale 2 puffs into the lungs every 6 (six) hours as needed for  wheezing or shortness of breath.    No facility-administered encounter medications on file as of 04/19/2021.    Patient Active Problem List   Diagnosis Date Noted   Hypoalbuminemia 05/17/2020   Moderate protein malnutrition ([place]) 05/17/2020   Melena 05/16/2020   Hyperlipidemia    Hypokalemia    GI bleed 03/20/2020   Lower GI bleed 03/19/2020   Acute on chronic congestive heart failure (HCC)    Symptomatic anemia    Iron deficiency anemia    Heme positive stool    ABLA (acute blood loss anemia) 01/22/2020   Supplemental oxygen dependent 10/22/2019   Chronic dyspnea 10/22/2019   Hypertension associated with diabetes ([place]) 10/22/2019   Bilateral hand pain 09/21/2016   Fatigue 07/30/2015   Chronic diastolic HF (heart failure) ([place]) 06/29/2015   Postmenopausal bleeding 04/17/2015   Excessive daytime sleepiness 12/19/2014   Swelling of lower extremity 11/27/2014   Back pain 06/13/2013   Sinusitis, chronic 05/30/2012   Allergic rhinitis 06/06/2011   Hyperlipidemia associated with type 2 diabetes mellitus ([place]) 03/24/2009   Diabetes mellitus, type 2 ([place]) 08/12/2008   Pulmonary nodule 08/29/2007   COPD (chronic obstructive pulmonary disease) ([place]) 02/14/2007   Class 3 obesity ([place]) 05/25/2006   TOBACCO DEPENDENCE 05/25/2006   Depression, recurrent ([place]) 05/25/2006   HYPERTENSION, BENIGN SYSTEMIC 05/25/2006   DJD, UNSPECIFIED 05/25/2006    Conditions to be addressed/monitored: monitor client management of depression and anxiety symptoms  Care Plan : LCSW Care Plan  Updates made by Katha Cabal, LCSW since 04/19/2021 12:00 AM     Problem: Emotional Distress      Goal: Emotional Health Supported.  Manage Depression issues. Manage Anxiety issues   Start Date: 04/19/2021  Expected End Date: 07/14/2021  This Visit's Progress: On track  Priority: Medium  Note:   Current Barriers:  Mobility issues Depression issues Anxiety issues Suicidal Ideation/Homicidal Ideation:  No  Clinical Social Work Goal(s):  patient will work with SW monthly by telephone or in person to reduce or manage symptoms related to depression and related to anxiety. Patient will communicate as needed in next 30 days with RNCM to discuss nursing needs of client.  Client to practice self care in next 30 days, such as allowing time to rest, engage in hobbies, and eating meals on normal routine  Interventions: 1:1 collaboration with [name], DO regarding development and update of comprehensive plan of care as evidenced by provider attestation and co-signature Reviewed pain issues of client; reviewed appetite of client Discussed recent iron infusion treatments of client. Client said she has been receiving iron infusion treatments as scheduled. She said she is scheduled to receive another iron infusion treatment in February of 2023. Discussed client referral to Gastroenterologist at [facility]. Client is glad she was referred to Gastroenterologist at [facility]. She said she is waiting on call from [facility] to see when she will be able to have appointment at [facility] with Gastroenterologist Reviewed mood of client. She said she  does get sad occasionally. But, she said she was prescribed Pristiq and that since she has been using Pristiq , she feels that her mood is doing better. Discussed medication procurement of client Provided counseling support for client Reviewed in home support for client. Client said she has in home support from her granddaughter, Tequila Rottmann Encouraged client to call RNCM as needed for nursing support for client.  Patient Self Care Activities:  Self administers medications as prescribed Attends all scheduled provider appointments Performs ADL's independently  Patient Coping Strengths:  Family Friends  Patient Self Care Deficits:  Mobility challenges Depression issues Anxiety issues  Patient Goals:  - spend time  or talk with others at least 2 to 3 times per week - practice relaxation or meditation daily - keep a calendar with appointment dates  Follow Up Plan: LCSW to call client on 06/14/21 at 10:00 AM to assess client needs    [name].Kanyia Heaslip MSW, [place] [facility] representative [facility] Care Management [phone]

## 2021-04-19 NOTE — Patient Instructions (Addendum)
Visit Information  Patient Goals: Protect My Health (Patient). Manage Depression issues. Manage anxiety issues faced. Manage in home care needs  Timeframe:  Short-Term Goal Priority:  Medium Progress: On Track Start Date:       04/19/21                Expected End Date:    07/12/21           Follow Up Date:   06/14/21 at 10:00 AM    Protect  My Health (Patient) Manage Depression issues . Manage anxiety issues faced; Manage in home care needs   Why is this important?   Screening tests can find diseases early when they are easier to treat.  Your doctor or nurse will talk with you about which tests are important for you.  Getting shots for common diseases like the flu and shingles will help prevent them.     Patient Self Care Activities:  Self administers medications as prescribed Attends all scheduled provider appointments Performs ADL's independently  Patient Coping Strengths:  [place] friends  Patient Self Care Deficits:  Depression issues Mobility issues Low energy  Patient Goals:  - spend time or talk with others at least 2 to 3 times per week - practice relaxation or meditation daily - keep a calendar with appointment dates  Follow Up Plan: LCSW to call client on 06/14/21 at 10:00 AM to assess client needs   [name].Braidyn Peace MSW, [place] Holiday representative [facility] Care Management [phone]

## 2021-04-27 DIAGNOSIS — E1169 Type 2 diabetes mellitus with other specified complication: Secondary | ICD-10-CM

## 2021-04-27 DIAGNOSIS — J449 Chronic obstructive pulmonary disease, unspecified: Secondary | ICD-10-CM

## 2021-04-27 DIAGNOSIS — I5032 Chronic diastolic (congestive) heart failure: Secondary | ICD-10-CM

## 2021-04-27 DIAGNOSIS — D500 Iron deficiency anemia secondary to blood loss (chronic): Secondary | ICD-10-CM

## 2021-04-27 DIAGNOSIS — E1159 Type 2 diabetes mellitus with other circulatory complications: Secondary | ICD-10-CM

## 2021-04-27 DIAGNOSIS — F331 Major depressive disorder, recurrent, moderate: Secondary | ICD-10-CM

## 2021-04-27 DIAGNOSIS — E785 Hyperlipidemia, unspecified: Secondary | ICD-10-CM

## 2021-04-27 DIAGNOSIS — I152 Hypertension secondary to endocrine disorders: Secondary | ICD-10-CM

## 2021-04-29 DIAGNOSIS — M5136 Other intervertebral disc degeneration, lumbar region: Secondary | ICD-10-CM | POA: Diagnosis not present

## 2021-04-29 DIAGNOSIS — Z6841 Body Mass Index (BMI) 40.0 and over, adult: Secondary | ICD-10-CM | POA: Diagnosis not present

## 2021-04-29 DIAGNOSIS — G894 Chronic pain syndrome: Secondary | ICD-10-CM | POA: Diagnosis not present

## 2021-04-29 DIAGNOSIS — E119 Type 2 diabetes mellitus without complications: Secondary | ICD-10-CM | POA: Diagnosis not present

## 2021-04-29 DIAGNOSIS — Z79899 Other long term (current) drug therapy: Secondary | ICD-10-CM | POA: Diagnosis not present

## 2021-04-29 DIAGNOSIS — R030 Elevated blood-pressure reading, without diagnosis of hypertension: Secondary | ICD-10-CM | POA: Diagnosis not present

## 2021-04-30 ENCOUNTER — Inpatient Hospital Stay (HOSPITAL_COMMUNITY): Payer: Medicare PPO

## 2021-04-30 ENCOUNTER — Inpatient Hospital Stay (HOSPITAL_COMMUNITY): Payer: Medicare PPO | Attending: Hematology

## 2021-04-30 ENCOUNTER — Other Ambulatory Visit: Payer: Self-pay

## 2021-04-30 ENCOUNTER — Telehealth: Payer: Self-pay | Admitting: Family Medicine

## 2021-04-30 VITALS — BP 133/64 | HR 86 | Temp 98.2°F | Resp 20

## 2021-04-30 DIAGNOSIS — D500 Iron deficiency anemia secondary to blood loss (chronic): Secondary | ICD-10-CM

## 2021-04-30 DIAGNOSIS — E538 Deficiency of other specified B group vitamins: Secondary | ICD-10-CM | POA: Insufficient documentation

## 2021-04-30 DIAGNOSIS — C9000 Multiple myeloma not having achieved remission: Secondary | ICD-10-CM | POA: Insufficient documentation

## 2021-04-30 DIAGNOSIS — Z79899 Other long term (current) drug therapy: Secondary | ICD-10-CM | POA: Diagnosis not present

## 2021-04-30 DIAGNOSIS — D509 Iron deficiency anemia, unspecified: Secondary | ICD-10-CM | POA: Insufficient documentation

## 2021-04-30 LAB — CBC WITH DIFFERENTIAL/PLATELET
Abs Immature Granulocytes: 0.01 10*3/uL (ref 0.00–0.07)
Basophils Absolute: 0 10*3/uL (ref 0.0–0.1)
Basophils Relative: 0 %
Eosinophils Absolute: 0.1 10*3/uL (ref 0.0–0.5)
Eosinophils Relative: 1 %
HCT: 24.7 % — ABNORMAL LOW (ref 36.0–46.0)
Hemoglobin: 6.7 g/dL — CL (ref 12.0–15.0)
Immature Granulocytes: 0 %
Lymphocytes Relative: 24 %
Lymphs Abs: 1.1 10*3/uL (ref 0.7–4.0)
MCH: 25.6 pg — ABNORMAL LOW (ref 26.0–34.0)
MCHC: 27.1 g/dL — ABNORMAL LOW (ref 30.0–36.0)
MCV: 94.3 fL (ref 80.0–100.0)
Monocytes Absolute: 0.5 10*3/uL (ref 0.1–1.0)
Monocytes Relative: 10 %
Neutro Abs: 2.9 10*3/uL (ref 1.7–7.7)
Neutrophils Relative %: 65 %
Platelets: 109 10*3/uL — ABNORMAL LOW (ref 150–400)
RBC: 2.62 MIL/uL — ABNORMAL LOW (ref 3.87–5.11)
RDW: 21.6 % — ABNORMAL HIGH (ref 11.5–15.5)
WBC: 4.5 10*3/uL (ref 4.0–10.5)
nRBC: 0 % (ref 0.0–0.2)

## 2021-04-30 LAB — PREPARE RBC (CROSSMATCH)

## 2021-04-30 LAB — SAMPLE TO BLOOD BANK

## 2021-04-30 MED ORDER — CYANOCOBALAMIN 1000 MCG/ML IJ SOLN
1000.0000 ug | Freq: Once | INTRAMUSCULAR | Status: AC
  Administered 2021-04-30: 1000 ug via INTRAMUSCULAR
  Filled 2021-04-30: qty 1

## 2021-04-30 NOTE — Progress Notes (Unsigned)
CRITICAL VALUE ALERT Critical value received:  HGB 6.7 Date of notification:  04/30/2021 Time of notification: 11:14 am.  Critical value read back:  Yes.   Nurse who received alert:  B. Artemisa Sladek RN MD notified time and response:  R. [place] Pa.

## 2021-04-30 NOTE — Telephone Encounter (Signed)
REFERRAL REQUEST Telephone Note  Have you been seen at our office for this problem? yes (Advise that they may need an appointment with their PCP before a referral can be done)  Reason for Referral: Gastro issues/polups? Referral discussed with patient: yes  Best contact number of patient for referral team: [phone]    Has patient been seen by a specialist for this issue before: yes, wants second opinion  Patient provider preference for referral: not sure Patient location preference for referral: [facility]   Patient notified that referrals can take up to a week or longer to process. If they haven't heard anything within a week they should call back and speak with the referral department.    Gottschalk's pt.  She is at [facility] now  bc Hemoglobin is 6.6 & she is getting blood transfusion done on Monday. She has been waiting over 10 days to a referral for second opion to [facility].  Please call pt's cell#  I told pt that it maybe Monday before she gets a call.

## 2021-04-30 NOTE — Patient Instructions (Signed)
[facility]  Discharge Instructions: Thank you for choosing [facility] to provide your oncology and hematology care.  If you have a lab appointment with the [facility], please come in thru the Main Entrance and check in at the main information desk.  Wear comfortable clothing and clothing appropriate for easy access to any Portacath or PICC line.   We strive to give you quality time with your provider. You may need to reschedule your appointment if you arrive late (15 or more minutes).  Arriving late affects you and other patients whose appointments are after yours.  Also, if you miss three or more appointments without notifying the office, you may be dismissed from the clinic at the providers discretion.      For prescription refill requests, have your pharmacy contact our office and allow 72 hours for refills to be completed.    Today you received B12 injection     BELOW ARE SYMPTOMS THAT SHOULD BE REPORTED IMMEDIATELY: *FEVER GREATER THAN 100.4 F (38 C) OR HIGHER *CHILLS OR SWEATING *NAUSEA AND VOMITING THAT IS NOT CONTROLLED WITH YOUR NAUSEA MEDICATION *UNUSUAL SHORTNESS OF BREATH *UNUSUAL BRUISING OR BLEEDING *URINARY PROBLEMS (pain or burning when urinating, or frequent urination) *BOWEL PROBLEMS (unusual diarrhea, constipation, pain near the anus) TENDERNESS IN MOUTH AND THROAT WITH OR WITHOUT PRESENCE OF ULCERS (sore throat, sores in mouth, or a toothache) UNUSUAL RASH, SWELLING OR PAIN  UNUSUAL VAGINAL DISCHARGE OR ITCHING   Items with * indicate a potential emergency and should be followed up as soon as possible or go to the Emergency Department if any problems should occur.  Please show the CHEMOTHERAPY ALERT CARD or IMMUNOTHERAPY ALERT CARD at check-in to the Emergency Department and triage nurse.  Should you have questions after your visit or need to cancel or reschedule your appointment, please contact [facility] [phone]   and follow the prompts.  Office hours are 8:00 a.m. to 4:30 p.m. Monday - Friday. Please note that voicemails left after 4:00 p.m. may not be returned until the following business day.  We are closed weekends and major holidays. You have access to a nurse at all times for urgent questions. Please call the main number to the clinic [phone] and follow the prompts.  For any non-urgent questions, you may also contact your provider using MyChart. We now offer e-Visits for anyone 9 and older to request care online for non-urgent symptoms. For details visit [email or web address].   Also download the MyChart app! Go to the app store, search "MyChart", open the app, select [facility], and log in with your MyChart username and password.  Due to Covid, a mask is required upon entering the hospital/clinic. If you do not have a mask, one will be given to you upon arrival. For doctor visits, patients may have 1 support person aged 35 or older with them. For treatment visits, patients cannot have anyone with them due to current Covid guidelines and our immunocompromised population.

## 2021-04-30 NOTE — Progress Notes (Signed)
Agree with PRBC x 2.  If the patient is having signs of active bleeding, such as bright red blood in the toilet or black tarry bowel movements, please have her go to the ER to see if she can be admitted to the hospital for expedited GI evaluation.

## 2021-04-30 NOTE — Progress Notes (Signed)
Savannah Burns presents today for injection per the provider's orders.  B12 administration without incident; injection site WNL; see MAR for injection details.  Patient tolerated procedure well and without incident.  No questions or complaints noted at this time. Discharged from clinic via wheelchair in stable condition. Alert and oriented x 3. F/U with [facility] as scheduled.

## 2021-04-30 NOTE — Chronic Care Management (AMB) (Signed)
°  Chronic Care Burns   Note  04/30/2021 Name: Savannah Burns MRN: [id number] DOB: [birth year]  Savannah Burns is a 71 y.o. year old female who is a primary care patient of [name], DO. [name] is currently enrolled in care Burns services. An additional referral for Pharm D  was placed.   Follow up plan: Unsuccessful telephone outreach attempt made. A HIPAA compliant phone message was left for the patient providing contact information and requesting a return call.  The care Burns team will reach out to the patient again over the next 7 days.  If patient returns call to provider office, please advise to call [place]  at [place], [place], [place] Burns  Sav[place] Burns, [place] [address] Direct Dial: [phone] [email or web address] Website: [email or web address]

## 2021-05-03 ENCOUNTER — Inpatient Hospital Stay (HOSPITAL_COMMUNITY): Payer: Medicare PPO

## 2021-05-03 ENCOUNTER — Other Ambulatory Visit: Payer: Self-pay

## 2021-05-03 ENCOUNTER — Encounter (HOSPITAL_COMMUNITY): Payer: Medicare PPO

## 2021-05-03 ENCOUNTER — Encounter (HOSPITAL_COMMUNITY): Payer: Self-pay

## 2021-05-03 VITALS — BP 137/62 | HR 77 | Temp 97.3°F | Resp 20

## 2021-05-03 DIAGNOSIS — Z79899 Other long term (current) drug therapy: Secondary | ICD-10-CM | POA: Diagnosis not present

## 2021-05-03 DIAGNOSIS — D500 Iron deficiency anemia secondary to blood loss (chronic): Secondary | ICD-10-CM

## 2021-05-03 DIAGNOSIS — D509 Iron deficiency anemia, unspecified: Secondary | ICD-10-CM | POA: Diagnosis not present

## 2021-05-03 DIAGNOSIS — E538 Deficiency of other specified B group vitamins: Secondary | ICD-10-CM | POA: Diagnosis not present

## 2021-05-03 DIAGNOSIS — C9000 Multiple myeloma not having achieved remission: Secondary | ICD-10-CM | POA: Diagnosis not present

## 2021-05-03 MED ORDER — ACETAMINOPHEN 325 MG PO TABS
650.0000 mg | ORAL_TABLET | Freq: Once | ORAL | Status: AC
  Administered 2021-05-03: 650 mg via ORAL
  Filled 2021-05-03: qty 2

## 2021-05-03 MED ORDER — SODIUM CHLORIDE 0.9% IV SOLUTION
250.0000 mL | Freq: Once | INTRAVENOUS | Status: AC
  Administered 2021-05-03: 250 mL via INTRAVENOUS

## 2021-05-03 MED ORDER — DIPHENHYDRAMINE HCL 25 MG PO CAPS
25.0000 mg | ORAL_CAPSULE | Freq: Once | ORAL | Status: AC
  Administered 2021-05-03: 25 mg via ORAL
  Filled 2021-05-03: qty 1

## 2021-05-03 NOTE — Telephone Encounter (Signed)
Pt calling to check up on this referral.

## 2021-05-03 NOTE — Patient Instructions (Signed)
[facility]  Discharge Instructions: Thank you for choosing [facility] to provide your oncology and hematology care.  If you have a lab appointment with the [facility], please come in thru the Main Entrance and check in at the main information desk.  Wear comfortable clothing and clothing appropriate for easy access to any Portacath or PICC line.   We strive to give you quality time with your provider. You may need to reschedule your appointment if you arrive late (15 or more minutes).  Arriving late affects you and other patients whose appointments are after yours.  Also, if you miss three or more appointments without notifying the office, you may be dismissed from the clinic at the providers discretion.      For prescription refill requests, have your pharmacy contact our office and allow 72 hours for refills to be completed.    Today you received the following 2 units of PRBCs. Return as scheduled.   To help prevent nausea and vomiting after your treatment, we encourage you to take your nausea medication as directed.  BELOW ARE SYMPTOMS THAT SHOULD BE REPORTED IMMEDIATELY: *FEVER GREATER THAN 100.4 F (38 C) OR HIGHER *CHILLS OR SWEATING *NAUSEA AND VOMITING THAT IS NOT CONTROLLED WITH YOUR NAUSEA MEDICATION *UNUSUAL SHORTNESS OF BREATH *UNUSUAL BRUISING OR BLEEDING *URINARY PROBLEMS (pain or burning when urinating, or frequent urination) *BOWEL PROBLEMS (unusual diarrhea, constipation, pain near the anus) TENDERNESS IN MOUTH AND THROAT WITH OR WITHOUT PRESENCE OF ULCERS (sore throat, sores in mouth, or a toothache) UNUSUAL RASH, SWELLING OR PAIN  UNUSUAL VAGINAL DISCHARGE OR ITCHING   Items with * indicate a potential emergency and should be followed up as soon as possible or go to the Emergency Department if any problems should occur.  Please show the CHEMOTHERAPY ALERT CARD or IMMUNOTHERAPY ALERT CARD at check-in to the Emergency Department and triage  nurse.  Should you have questions after your visit or need to cancel or reschedule your appointment, please contact [facility] [phone]  and follow the prompts.  Office hours are 8:00 a.m. to 4:30 p.m. Monday - Friday. Please note that voicemails left after 4:00 p.m. may not be returned until the following business day.  We are closed weekends and major holidays. You have access to a nurse at all times for urgent questions. Please call the main number to the clinic [phone] and follow the prompts.  For any non-urgent questions, you may also contact your provider using MyChart. We now offer e-Visits for anyone 12 and older to request care online for non-urgent symptoms. For details visit [email or web address].   Also download the MyChart app! Go to the app store, search "MyChart", open the app, select [facility], and log in with your MyChart username and password.  Due to Covid, a mask is required upon entering the hospital/clinic. If you do not have a mask, one will be given to you upon arrival. For doctor visits, patients may have 1 support person aged 47 or older with them. For treatment visits, patients cannot have anyone with them due to current Covid guidelines and our immunocompromised population.

## 2021-05-03 NOTE — Progress Notes (Signed)
Patient presents today for 2 units of RBCs. Return as scheduled. Patient tolerated blood transfusions with no complaints voiced. Peripheral IV site clean and dry with good blood return noted before and after infusion. Band aid applied. Patient made aware of next lab appointment. VSS with discharge and left in satisfactory condition with no s/s of distress noted.

## 2021-05-04 LAB — TYPE AND SCREEN
ABO/RH(D): A POS
Antibody Screen: NEGATIVE
Unit division: 0
Unit division: 0

## 2021-05-04 LAB — BPAM RBC
Blood Product Expiration Date: 202302182359
Blood Product Expiration Date: 202302222359
ISSUE DATE / TIME: 202302061045
ISSUE DATE / TIME: 202302061232
Unit Type and Rh: 6200
Unit Type and Rh: 6200

## 2021-05-05 ENCOUNTER — Other Ambulatory Visit (HOSPITAL_COMMUNITY): Payer: Medicare PPO

## 2021-05-05 ENCOUNTER — Encounter (HOSPITAL_COMMUNITY): Payer: Medicare PPO

## 2021-05-07 ENCOUNTER — Telehealth: Payer: Self-pay | Admitting: *Deleted

## 2021-05-07 NOTE — Telephone Encounter (Signed)
3rd unsuccessful outreach to schedule with pharm d

## 2021-05-07 NOTE — Telephone Encounter (Signed)
Referral faxed to:    Referral faxed to:   [facility], [place] [phone]   Release ID #:[id number]   On 04/15/21   Pt is very concerned waiting for this referral = would like Dr Darnell Level to reach out and let them know the importance and try to get her in sooner. Pt aware the I will send message to Dr Darnell Level and referrals and let them know.

## 2021-05-07 NOTE — Chronic Care Management (AMB) (Signed)
°  Chronic Care Management   Note  05/07/2021 Name: Savannah Burns MRN: [id number] DOB: 24-Jun-1950  Savannah Burns is a 71 y.o. year old female who is a primary care patient of [name], DO. [name] is currently enrolled in care management services. An additional referral for pharm d  was placed.   Follow up plan: Unable to make contact on outreach attempts x 3. PCP [name], DO notified via routed documentation in medical record.   Noreene Larsson, [address], [place], [place] [address] Direct Dial: [phone] [email or web address]: [email or web address]

## 2021-05-10 NOTE — Telephone Encounter (Signed)
Is there anyway to get this patient in sooner or was her appt first available?

## 2021-05-13 ENCOUNTER — Inpatient Hospital Stay (HOSPITAL_COMMUNITY): Payer: Medicare PPO

## 2021-05-13 DIAGNOSIS — D500 Iron deficiency anemia secondary to blood loss (chronic): Secondary | ICD-10-CM

## 2021-05-13 DIAGNOSIS — D509 Iron deficiency anemia, unspecified: Secondary | ICD-10-CM | POA: Diagnosis not present

## 2021-05-13 DIAGNOSIS — C9000 Multiple myeloma not having achieved remission: Secondary | ICD-10-CM | POA: Diagnosis not present

## 2021-05-13 DIAGNOSIS — E538 Deficiency of other specified B group vitamins: Secondary | ICD-10-CM | POA: Diagnosis not present

## 2021-05-13 DIAGNOSIS — Z79899 Other long term (current) drug therapy: Secondary | ICD-10-CM | POA: Diagnosis not present

## 2021-05-13 LAB — CBC WITH DIFFERENTIAL/PLATELET
Abs Immature Granulocytes: 0.01 10*3/uL (ref 0.00–0.07)
Basophils Absolute: 0 10*3/uL (ref 0.0–0.1)
Basophils Relative: 0 %
Eosinophils Absolute: 0 10*3/uL (ref 0.0–0.5)
Eosinophils Relative: 1 %
HCT: 27.7 % — ABNORMAL LOW (ref 36.0–46.0)
Hemoglobin: 7.6 g/dL — ABNORMAL LOW (ref 12.0–15.0)
Immature Granulocytes: 0 %
Lymphocytes Relative: 25 %
Lymphs Abs: 1.1 10*3/uL (ref 0.7–4.0)
MCH: 24.5 pg — ABNORMAL LOW (ref 26.0–34.0)
MCHC: 27.4 g/dL — ABNORMAL LOW (ref 30.0–36.0)
MCV: 89.4 fL (ref 80.0–100.0)
Monocytes Absolute: 0.5 10*3/uL (ref 0.1–1.0)
Monocytes Relative: 11 %
Neutro Abs: 2.8 10*3/uL (ref 1.7–7.7)
Neutrophils Relative %: 63 %
Platelets: 92 10*3/uL — ABNORMAL LOW (ref 150–400)
RBC: 3.1 MIL/uL — ABNORMAL LOW (ref 3.87–5.11)
RDW: 19.1 % — ABNORMAL HIGH (ref 11.5–15.5)
WBC: 4.5 10*3/uL (ref 4.0–10.5)
nRBC: 0 % (ref 0.0–0.2)

## 2021-05-13 LAB — SAMPLE TO BLOOD BANK

## 2021-05-19 ENCOUNTER — Ambulatory Visit (INDEPENDENT_AMBULATORY_CARE_PROVIDER_SITE_OTHER): Payer: Medicare PPO | Admitting: Family Medicine

## 2021-05-19 ENCOUNTER — Encounter: Payer: Self-pay | Admitting: Family Medicine

## 2021-05-19 VITALS — BP 132/63 | HR 119 | Temp 98.7°F | Ht 62.5 in | Wt 291.4 lb

## 2021-05-19 DIAGNOSIS — I152 Hypertension secondary to endocrine disorders: Secondary | ICD-10-CM

## 2021-05-19 DIAGNOSIS — E1169 Type 2 diabetes mellitus with other specified complication: Secondary | ICD-10-CM | POA: Diagnosis not present

## 2021-05-19 DIAGNOSIS — Z0001 Encounter for general adult medical examination with abnormal findings: Secondary | ICD-10-CM | POA: Diagnosis not present

## 2021-05-19 DIAGNOSIS — D500 Iron deficiency anemia secondary to blood loss (chronic): Secondary | ICD-10-CM | POA: Diagnosis not present

## 2021-05-19 DIAGNOSIS — E1159 Type 2 diabetes mellitus with other circulatory complications: Secondary | ICD-10-CM | POA: Diagnosis not present

## 2021-05-19 DIAGNOSIS — F418 Other specified anxiety disorders: Secondary | ICD-10-CM | POA: Diagnosis not present

## 2021-05-19 DIAGNOSIS — Z0000 Encounter for general adult medical examination without abnormal findings: Secondary | ICD-10-CM

## 2021-05-19 MED ORDER — RYBELSUS 3 MG PO TABS: 3.0000 mg | ORAL_TABLET | Freq: Every day | ORAL | 0 refills | Status: DC

## 2021-05-19 NOTE — Progress Notes (Signed)
Savannah Burns is a 71 y.o. female presents to office today for annual physical exam examination.    Concerns today include: 1.  Chronic GI bleed Patient has an appointment in April with [facility].  It took about 3 weeks before she got a call for an appointment but she is very grateful that she has one now.  She does voice some concern about what the next steps are since so far there is not been any specific etiology of her chronic GI bleed that has been discovered.  She is hopeful that they will have some insight.  She does report chronic fatigue and dyspnea on exertion.  Though she admits that she does not take her Lasix as religiously as she should because it causes so much increased urination.  She continues to use supplemental oxygen  2.  Type 2 diabetes with hypertension and hyperlipidemia Patient has not been taking her metformin as of late.  She would like to go ahead and start on a GLP though because she wants to lose weight.  She is compliant with her Mevacor.  Needs a urine microalbumin.  No reports of visual disturbance or chest pain but again admits to shortness of breath as above.  She is been trying to utilize her rolling walker more because she feels physically deconditioned.  Occupation: Disabled  Diet: No structured, Exercise: No structured Last eye exam: Up-to-date Last dental exam: N/A Last colonoscopy: Up-to-date Last mammogram: Needs Last pap smear: N/A Refills needed today: None Immunizations needed: Immunization History  Administered Date(s) Administered   Fluad Quad(high Dose 65+) 12/09/2020   Hepatitis B 05/30/1991, 06/27/1991, 12/04/1991   Influenza Whole 01/04/2008, 03/24/2009   Influenza, High Dose Seasonal PF 01/17/2018, 12/29/2018   Influenza,inj,Quad PF,6+ Mos 11/27/2014   Influenza-Unspecified 01/26/2013, 03/01/2014   Moderna Sars-Covid-2 Vaccination 06/12/2019   Pneumococcal Conjugate-13 03/01/2014   Pneumococcal Polysaccharide-23 09/08/2015   Td  05/27/2002   Tdap 09/04/2020   Zoster, Live 02/14/2013     Past Medical History:  Diagnosis Date   Adrenal adenoma, left    Stable   Anxiety    Arthritis    bilateral hands   Depression    Diabetes mellitus, type 2 ([place]) 08/12/2008   Qualifier: Diagnosis of  By: Deborra Medina MD, Talia     Dyspnea    Esophageal varices (HCC)    Grade II diastolic dysfunction    History of kidney stones    Hyperlipidemia    Hypertension    Lower back pain    Lower GI bleed 03/19/2020   Panic attacks    Pneumonia    currently taking antibiotic and prednisone for early stages of pneumonia   Pulmonary nodules    bilateral   Skin cancer    face   Social History   Socioeconomic History   Marital status: Widowed    Spouse name: Not on file   Number of children: 2   Years of education: 14   Highest education level: Not on file  Occupational History   Occupation: Retired   Tobacco Use   Smoking status: Former    Packs/day: 1.50    Years: 40.00    Pack years: 60.00    Types: Cigarettes    Quit date: 04/29/2015    Years since quitting: 6.0   Smokeless tobacco: Never   Tobacco comments:    Quit smoking 04/2015- Previous 1.5 ppd smoker  Vaping Use   Vaping Use: Never used  Substance and Sexual Activity  Alcohol use: No    Alcohol/week: 0.0 standard drinks   Drug use: No   Sexual activity: Not Currently    Birth control/protection: Post-menopausal  Other Topics Concern   Not on file  Social History Narrative   Her 64 year old granddaughter lives with her - one daughter lives nearby, but she doesn't have a good relationship with her. Has a great relationship with other daughter who lives 1.5 hrs away - talks to her daily on the phone.   Social Determinants of Health   Financial Resource Strain: Low Risk    Difficulty of Paying Living Expenses: Not very hard  Food Insecurity: No Food Insecurity   Worried About Charity fundraiser in the Last Year: Never true   Ran Out of Food in the Last  Year: Never true  Transportation Needs: No Transportation Needs   Lack of Transportation (Medical): No   Lack of Transportation (Non-Medical): No  Physical Activity: Inactive   Days of Exercise per Week: 0 days   Minutes of Exercise per Session: 0 min  Stress: Stress Concern Present   Feeling of Stress : To some extent  Social Connections: Socially Isolated   Frequency of Communication with Friends and Family: More than three times a week   Frequency of Social Gatherings with Friends and Family: Once a week   Attends Religious Services: Never   Marine scientist or Organizations: No   Attends Archivist Meetings: Never   Marital Status: Widowed  Intimate Partner Violence: Not At Risk   Fear of Current or Ex-Partner: No   Emotionally Abused: No   Physically Abused: No   Sexually Abused: No   Past Surgical History:  Procedure Laterality Date   BIOPSY  04/07/2020   Procedure: BIOPSY;  Surgeon: Eloise Harman, DO;  [facility];  Service: Endoscopy;;   Breast Cystectomy  Right    CESAREAN SECTION     COLONOSCOPY WITH PROPOFOL N/A 01/25/2020   Dr. Abbey Chatters: Nonbleeding internal hemorrhoids, diverticulosis, 5 mm polyp removed from the ascending colon, 10 mm polyp removed from the sigmoid colon, 30 mm polyp (tubulovillous adenoma with no high-grade dysplasia) removed from the transverse colon via piecemeal status post tattoo.  Other polyps were tubular adenomas.  3 month surveillance colonoscopy recommended.   COLONOSCOPY WITH PROPOFOL N/A 04/07/2020   Procedure: COLONOSCOPY WITH PROPOFOL;  Surgeon: Eloise Harman, DO;  [facility];  Service: Endoscopy;  Laterality: N/A;  3:00pm, pt knows new time per office   CYSTOSCOPY/URETEROSCOPY/HOLMIUM LASER/STENT PLACEMENT Bilateral 03/01/2019   Procedure: CYSTOSCOPY/RETROGRADEURETEROSCOPY/HOLMIUM LASER/STENT PLACEMENT;  Surgeon: Ceasar Mons, MD;  Location: [facility];  Service: Urology;  Laterality:  Bilateral;  ONLY NEEDS 60 MIN   ESOPHAGOGASTRODUODENOSCOPY (EGD) WITH PROPOFOL N/A 01/25/2020   Dr. Abbey Chatters: 4 columns grade 1 esophageal varices   ESOPHAGOGASTRODUODENOSCOPY (EGD) WITH PROPOFOL N/A 05/18/2020   Procedure: ESOPHAGOGASTRODUODENOSCOPY (EGD) WITH PROPOFOL;  Surgeon: Eloise Harman, DO;  [facility];  Service: Endoscopy;  Laterality: N/A;   ESOPHAGOGASTRODUODENOSCOPY (EGD) WITH PROPOFOL N/A 07/28/2020   Procedure: ESOPHAGOGASTRODUODENOSCOPY (EGD) WITH PROPOFOL;  Surgeon: Eloise Harman, DO;  [facility];  Service: Endoscopy;  Laterality: N/A;  am or early PM due to givens capsule placement   GIVENS CAPSULE STUDY N/A 05/18/2020   Procedure: [place];  Surgeon: Harvel Quale, MD;  [facility];  Service: Gastroenterology;  Laterality: N/A;   GIVENS CAPSULE STUDY N/A 07/28/2020   Procedure:  GIVENS CAPSULE STUDY;  Surgeon: Eloise Harman, DO;  [facility];  Service: Endoscopy;  Laterality: N/A;   POLYPECTOMY  01/25/2020   Procedure: POLYPECTOMY;  Surgeon: Eloise Harman, DO;  [facility];  Service: Endoscopy;;   POLYPECTOMY  04/07/2020   Procedure: POLYPECTOMY INTESTINAL;  Surgeon: Eloise Harman, DO;  [facility];  Service: Endoscopy;;   SKIN CANCER EXCISION     Face   SPINE SURGERY     SUBMUCOSAL TATTOO INJECTION  01/25/2020   Procedure: SUBMUCOSAL TATTOO INJECTION;  Surgeon: Eloise Harman, DO;  [facility];  Service: Endoscopy;;   Family History  Problem Relation Age of Onset   Diabetes Father    Heart disease Father 11       MI   Hypertension Father    Anemia Mother        Transfusion dependent   COPD Sister    Cancer Paternal Grandmother 85       Pancreatic    Current Outpatient Medications:    baclofen (LIORESAL) 10 MG tablet, Take by mouth., Disp: , Rfl:    Blood Glucose Calibration (GLUCOMETER DEX HIGH CONTROL) LIQD, Test CBG's once daily. , Disp:  , Rfl:    Blood Glucose Monitoring Suppl (TRUE METRIX AIR GLUCOSE METER) w/Device KIT, Check BS daily Dx E11.9, Disp: 1 kit, Rfl: 0   Cholecalciferol (VITAMIN D) 50 MCG (2000 UT) tablet, Take 4,000 Units by mouth daily., Disp: , Rfl:    desvenlafaxine (PRISTIQ) 50 MG 24 hr tablet, Take 1 tablet (50 mg total) by mouth daily., Disp: 90 tablet, Rfl: 3   erythromycin ophthalmic ointment, Place 1 application into the left eye at bedtime., Disp: 3.5 g, Rfl: 1   esomeprazole (NEXIUM) 20 MG capsule, Take 1 capsule (20 mg total) by mouth daily at 12 noon., Disp: 90 capsule, Rfl: 3   furosemide (LASIX) 20 MG tablet, Take 20 mg by mouth. bid, Disp: , Rfl:    glucose blood (TRUE METRIX BLOOD GLUCOSE TEST) test strip, Check BS daily Dx E11.9, Disp: 100 each, Rfl: 3   lovastatin (MEVACOR) 20 MG tablet, Take 1 tablet (20 mg total) by mouth at bedtime., Disp: 90 tablet, Rfl: 3   metFORMIN (GLUCOPHAGE) 1000 MG tablet, Take 1 tablet (1,000 mg total) by mouth daily with breakfast., Disp: 90 tablet, Rfl: 3   oxyCODONE-acetaminophen (PERCOCET) 10-325 MG tablet, Take 1 tablet by mouth every 6 (six) hours as needed for pain., Disp: , Rfl:    OXYGEN, Inhale 3 L into the lungs continuous., Disp: , Rfl:    potassium chloride SA (KLOR-CON) 20 MEQ tablet, Take 2 tablets (40 mEq total) by mouth 2 (two) times daily. (Patient taking differently: Take 40 mEq by mouth daily.), Disp: 360 tablet, Rfl: 3   TRUEplus Lancets 30G MISC, Check BS daily Dx E11.9, Disp: 100 each, Rfl: 3   vitamin B-12 (CYANOCOBALAMIN) 1000 MCG tablet, Take 2,000 mcg by mouth daily., Disp: , Rfl:    ferrous sulfate 325 (65 FE) MG tablet, Take 1 tablet (325 mg total) by mouth 2 (two) times daily with a meal. (Patient taking differently: Take 325 mg by mouth daily with breakfast.), Disp: 60 tablet, Rfl: 1   torsemide (DEMADEX) 20 MG tablet, Take 3 tablets (60 mg total) by mouth 2 (two) times daily. (Patient taking differently: Take 50 mg by mouth 2 (two) times  daily.), Disp: 180 tablet, Rfl: 11  Allergies  Allergen Reactions   Keflex [Cephalexin]  Nausea And Vomiting     ROS: Review of Systems A comprehensive review of systems was negative except for: Respiratory: positive for dyspnea on exertion and requiring supplemental oxygen Gastrointestinal: positive for chronic GI bleed Musculoskeletal: positive for arthralgias, muscle weakness, and stiff joints Behavioral/Psych: positive for anxiety, depression, and stress over medical issues    Physical exam BP 132/63    Pulse (!) 119    Temp 98.7 F (37.1 C)    Ht 5' 2.5" (1.588 m)    Wt 291 lb 6.4 oz (132.2 kg)    SpO2 96%    BMI 52.45 kg/m  General appearance: alert, cooperative, appears stated age, and morbidly obese Head: Normocephalic, without obvious abnormality, atraumatic Eyes: negative findings: lids and lashes normal, conjunctivae and sclerae normal, and corneas clear Ears: normal TM's and external ear canals both ears Nose: Nares normal. Septum midline. Mucosa normal. No drainage or sinus tenderness. Throat:  False teeth.  Oropharynx without masses or erythema Neck: no adenopathy, no carotid bruit, supple, symmetrical, trachea midline, and thyroid not enlarged, symmetric, no tenderness/mass/nodules Back: symmetric, no curvature. ROM normal. No CVA tenderness. Lungs: clear to auscultation bilaterally and dyspneic on exertion, utilizing supplemental oxygen by nasal cannula Heart:  Soft systolic murmur appreciated.  Regular rate and rhythm. Abdomen:  Obese, soft, nontender Extremities:  Lymphedema appreciated in bilateral lower extremities.  She has a scratch wound noted on the right medial lower leg Pulses: 2+ and symmetric Skin:  Venous stasis changes noted to the legs Lymph nodes: Cervical, supraclavicular, and axillary nodes normal. Neurologic: Grossly normal, some difficulty with remote memory.  Often glances to her granddaughter for memory stimulation Psych: Intermittently  depressed, when talking about her health.  Depression screen [facility] 2/9 05/19/2021 04/19/2021 04/14/2021  Decreased Interest _0 Down, Depressed, Hopeless 2 1 0  PHQ - 2 Score _1 Altered sleeping 0 1 3  Tired, decreased energy _2 Change in appetite 0 1 1  Feeling bad or failure about yourself  1 1 0  Trouble concentrating 2 0 0  Moving slowly or fidgety/restless 0 1 0  Suicidal thoughts 0 0 0  PHQ-9 Score _3 Difficult doing work/chores Very difficult Somewhat difficult Somewhat difficult  Some recent data might be hidden   GAD 7 : Generalized Anxiety Score 05/19/2021 04/14/2021 02/26/2021 12/09/2020  Nervous, Anxious, on Edge _4 0  Control/stop worrying 2 0 2 1  Worry too much - different things 2 0 2 1  Trouble relaxing _5 0  Restless 0 0 2 0  Easily annoyed or irritable 2 0 3 1  Afraid - awful might happen 0 0 2 1  Total GAD 7 Score _6 Anxiety Difficulty Somewhat difficult Somewhat difficult Very difficult Somewhat difficult    Assessment/ Plan: [name] here for annual physical exam.   Annual physical exam  Type 2 diabetes mellitus with other specified complication, without long-term current use of insulin (HCC) - Plan: Semaglutide (RYBELSUS) 3 MG TABS  Hypertension associated with diabetes (HCC)  Class 3 obesity (HCC)  Iron deficiency anemia due to chronic blood loss  Anxiety about health  Sugar sounds controlled based on fasting blood sugars at home.  We are going to start Rybelsus at low-dose to test her tolerance of the GLP class and hopefully encourage some weight loss.  The plan is likely to transition over to [place].  We discussed the risk  of the medications and indications for evaluation.  Her blood pressure is at goal.  No changes needed  Goal is to get some of the weight off of her so that she can improve clinically.  She certainly has a level of physical deconditioning based on today's exam with low mobility tolerance but did  in fact come in with her rolling walker today  She will be having a [facility] appointment within the next couple of months.  I am really hopeful that they will be insightful as to why she continues to have blood loss anemia.  She was pretty deflated by her ongoing medical issues.  Would like to see her again closely in about 3 months for checkup notably discussed the recommendations by her new gastroenterologist.  May need to consider advancing the Pristiq as she was pretty deflated on exam today about her ongoing health issues.  Katha Kuehne M. Lajuana Ripple, DO

## 2021-05-27 DIAGNOSIS — Z79899 Other long term (current) drug therapy: Secondary | ICD-10-CM | POA: Diagnosis not present

## 2021-05-27 DIAGNOSIS — M5136 Other intervertebral disc degeneration, lumbar region: Secondary | ICD-10-CM | POA: Diagnosis not present

## 2021-05-27 DIAGNOSIS — G894 Chronic pain syndrome: Secondary | ICD-10-CM | POA: Diagnosis not present

## 2021-05-27 DIAGNOSIS — R030 Elevated blood-pressure reading, without diagnosis of hypertension: Secondary | ICD-10-CM | POA: Diagnosis not present

## 2021-05-27 DIAGNOSIS — F1120 Opioid dependence, uncomplicated: Secondary | ICD-10-CM | POA: Diagnosis not present

## 2021-05-27 DIAGNOSIS — Z6841 Body Mass Index (BMI) 40.0 and over, adult: Secondary | ICD-10-CM | POA: Diagnosis not present

## 2021-05-27 DIAGNOSIS — E119 Type 2 diabetes mellitus without complications: Secondary | ICD-10-CM | POA: Diagnosis not present

## 2021-05-28 ENCOUNTER — Inpatient Hospital Stay (HOSPITAL_COMMUNITY): Payer: Medicare PPO

## 2021-05-28 ENCOUNTER — Other Ambulatory Visit: Payer: Self-pay

## 2021-05-28 ENCOUNTER — Inpatient Hospital Stay (HOSPITAL_COMMUNITY): Payer: Medicare PPO | Attending: Hematology

## 2021-05-28 VITALS — BP 133/62 | HR 89 | Temp 98.3°F | Resp 20

## 2021-05-28 VITALS — BP 125/46 | HR 88 | Temp 98.3°F | Resp 18

## 2021-05-28 DIAGNOSIS — D500 Iron deficiency anemia secondary to blood loss (chronic): Secondary | ICD-10-CM | POA: Diagnosis not present

## 2021-05-28 DIAGNOSIS — Z79899 Other long term (current) drug therapy: Secondary | ICD-10-CM | POA: Insufficient documentation

## 2021-05-28 LAB — CBC WITH DIFFERENTIAL/PLATELET
Abs Immature Granulocytes: 0.02 10*3/uL (ref 0.00–0.07)
Basophils Absolute: 0 10*3/uL (ref 0.0–0.1)
Basophils Relative: 0 %
Eosinophils Absolute: 0.1 10*3/uL (ref 0.0–0.5)
Eosinophils Relative: 1 %
HCT: 20.6 % — ABNORMAL LOW (ref 36.0–46.0)
Hemoglobin: 5.5 g/dL — CL (ref 12.0–15.0)
Immature Granulocytes: 0 %
Lymphocytes Relative: 25 %
Lymphs Abs: 1.4 10*3/uL (ref 0.7–4.0)
MCH: 23.3 pg — ABNORMAL LOW (ref 26.0–34.0)
MCHC: 26.7 g/dL — ABNORMAL LOW (ref 30.0–36.0)
MCV: 87.3 fL (ref 80.0–100.0)
Monocytes Absolute: 0.5 10*3/uL (ref 0.1–1.0)
Monocytes Relative: 9 %
Neutro Abs: 3.6 10*3/uL (ref 1.7–7.7)
Neutrophils Relative %: 65 %
Platelets: 116 10*3/uL — ABNORMAL LOW (ref 150–400)
RBC: 2.36 MIL/uL — ABNORMAL LOW (ref 3.87–5.11)
RDW: 19 % — ABNORMAL HIGH (ref 11.5–15.5)
WBC: 5.5 10*3/uL (ref 4.0–10.5)
nRBC: 0 % (ref 0.0–0.2)

## 2021-05-28 LAB — PREPARE RBC (CROSSMATCH)

## 2021-05-28 LAB — SAMPLE TO BLOOD BANK

## 2021-05-28 MED ORDER — DIPHENHYDRAMINE HCL 25 MG PO CAPS
25.0000 mg | ORAL_CAPSULE | Freq: Once | ORAL | Status: AC
  Administered 2021-05-28: 25 mg via ORAL
  Filled 2021-05-28: qty 1

## 2021-05-28 MED ORDER — CYANOCOBALAMIN 1000 MCG/ML IJ SOLN
1000.0000 ug | Freq: Once | INTRAMUSCULAR | Status: AC
  Administered 2021-05-28: 1000 ug via INTRAMUSCULAR
  Filled 2021-05-28: qty 1

## 2021-05-28 MED ORDER — SODIUM CHLORIDE 0.9% IV SOLUTION
250.0000 mL | Freq: Once | INTRAVENOUS | Status: AC
  Administered 2021-05-28: 250 mL via INTRAVENOUS

## 2021-05-28 MED ORDER — ACETAMINOPHEN 325 MG PO TABS
650.0000 mg | ORAL_TABLET | Freq: Once | ORAL | Status: AC
  Administered 2021-05-28: 650 mg via ORAL
  Filled 2021-05-28: qty 2

## 2021-05-28 NOTE — Progress Notes (Signed)
Jones presents today for injection per the provider's orders.  B12 administration without incident; injection site WNL; see MAR for injection details.  Patient tolerated procedure well and without incident.  No questions or complaints noted at this time.  ? ?Discharged from clinic via wheelchair in stable condition. Alert and oriented x 3. F/U with [facility] as scheduled.   ?

## 2021-05-28 NOTE — Patient Instructions (Signed)
[facility]  Discharge Instructions: ?Thank you for choosing [facility] to provide your oncology and hematology care.  ?If you have a lab appointment with the [facility], please come in thru the Main Entrance and check in at the main information desk. ? ?Wear comfortable clothing and clothing appropriate for easy access to any Portacath or PICC line.  ? ?We strive to give you quality time with your provider. You may need to reschedule your appointment if you arrive late (15 or more minutes).  Arriving late affects you and other patients whose appointments are after yours.  Also, if you miss three or more appointments without notifying the office, you may be dismissed from the clinic at the provider?s discretion.    ?  ?For prescription refill requests, have your pharmacy contact our office and allow 72 hours for refills to be completed.   ? ?Today you received 2 units of blood. ? ? ?BELOW ARE SYMPTOMS THAT SHOULD BE REPORTED IMMEDIATELY: ?*FEVER GREATER THAN 100.4 F (38 ?C) OR HIGHER ?*CHILLS OR SWEATING ?*NAUSEA AND VOMITING THAT IS NOT CONTROLLED WITH YOUR NAUSEA MEDICATION ?*UNUSUAL SHORTNESS OF BREATH ?*UNUSUAL BRUISING OR BLEEDING ?*URINARY PROBLEMS (pain or burning when urinating, or frequent urination) ?*BOWEL PROBLEMS (unusual diarrhea, constipation, pain near the anus) ?TENDERNESS IN MOUTH AND THROAT WITH OR WITHOUT PRESENCE OF ULCERS (sore throat, sores in mouth, or a toothache) ?UNUSUAL RASH, SWELLING OR PAIN  ?UNUSUAL VAGINAL DISCHARGE OR ITCHING  ? ?Items with * indicate a potential emergency and should be followed up as soon as possible or go to the Emergency Department if any problems should occur. ? ?Please show the CHEMOTHERAPY ALERT CARD or IMMUNOTHERAPY ALERT CARD at check-in to the Emergency Department and triage nurse. ? ?Should you have questions after your visit or need to cancel or reschedule your appointment, please contact [facility] [phone]   and follow the prompts.  Office hours are 8:00 a.m. to 4:30 p.m. Monday - Friday. Please note that voicemails left after 4:00 p.m. may not be returned until the following business day.  We are closed weekends and major holidays. You have access to a nurse at all times for urgent questions. Please call the main number to the clinic [phone] and follow the prompts. ? ?For any non-urgent questions, you may also contact your provider using MyChart. We now offer e-Visits for anyone 47 and older to request care online for non-urgent symptoms. For details visit [email or web address]. ?  ?Also download the MyChart app! Go to the app store, search "MyChart", open the app, select [facility], and log in with your MyChart username and password. ? ?Due to Covid, a mask is required upon entering the hospital/clinic. If you do not have a mask, one will be given to you upon arrival. For doctor visits, patients may have 1 support person aged 89 or older with them. For treatment visits, patients cannot have anyone with them due to current Covid guidelines and our immunocompromised population.  ?

## 2021-05-28 NOTE — Patient Instructions (Signed)
[facility]  Discharge Instructions: ?Thank you for choosing [facility] to provide your oncology and hematology care.  ?If you have a lab appointment with the [facility], please come in thru the Main Entrance and check in at the main information desk. ? ?Wear comfortable clothing and clothing appropriate for easy access to any Portacath or PICC line.  ? ?We strive to give you quality time with your provider. You may need to reschedule your appointment if you arrive late (15 or more minutes).  Arriving late affects you and other patients whose appointments are after yours.  Also, if you miss three or more appointments without notifying the office, you may be dismissed from the clinic at the provider?s discretion.    ?  ?For prescription refill requests, have your pharmacy contact our office and allow 72 hours for refills to be completed.   ? ?Today you received B12 injection. ?  ? ?BELOW ARE SYMPTOMS THAT SHOULD BE REPORTED IMMEDIATELY: ?*FEVER GREATER THAN 100.4 F (38 ?C) OR HIGHER ?*CHILLS OR SWEATING ?*NAUSEA AND VOMITING THAT IS NOT CONTROLLED WITH YOUR NAUSEA MEDICATION ?*UNUSUAL SHORTNESS OF BREATH ?*UNUSUAL BRUISING OR BLEEDING ?*URINARY PROBLEMS (pain or burning when urinating, or frequent urination) ?*BOWEL PROBLEMS (unusual diarrhea, constipation, pain near the anus) ?TENDERNESS IN MOUTH AND THROAT WITH OR WITHOUT PRESENCE OF ULCERS (sore throat, sores in mouth, or a toothache) ?UNUSUAL RASH, SWELLING OR PAIN  ?UNUSUAL VAGINAL DISCHARGE OR ITCHING  ? ?Items with * indicate a potential emergency and should be followed up as soon as possible or go to the Emergency Department if any problems should occur. ? ?Please show the CHEMOTHERAPY ALERT CARD or IMMUNOTHERAPY ALERT CARD at check-in to the Emergency Department and triage nurse. ? ?Should you have questions after your visit or need to cancel or reschedule your appointment, please contact [facility] [phone]   and follow the prompts.  Office hours are 8:00 a.m. to 4:30 p.m. Monday - Friday. Please note that voicemails left after 4:00 p.m. may not be returned until the following business day.  We are closed weekends and major holidays. You have access to a nurse at all times for urgent questions. Please call the main number to the clinic [phone] and follow the prompts. ? ?For any non-urgent questions, you may also contact your provider using MyChart. We now offer e-Visits for anyone 14 and older to request care online for non-urgent symptoms. For details visit [email or web address]. ?  ?Also download the MyChart app! Go to the app store, search "MyChart", open the app, select [facility], and log in with your MyChart username and password. ? ?Due to Covid, a mask is required upon entering the hospital/clinic. If you do not have a mask, one will be given to you upon arrival. For doctor visits, patients may have 1 support person aged 72 or older with them. For treatment visits, patients cannot have anyone with them due to current Covid guidelines and our immunocompromised population.  ?

## 2021-05-28 NOTE — Progress Notes (Signed)
That is fine.  If we are able to give her 2 units today, we can proceed with that. ? ?However, if she is having any bright red blood in the toilet or dark bowel movements, she should proceed to the emergency department for possible active GI bleeding.

## 2021-05-28 NOTE — Progress Notes (Signed)
Patient needs urgent blood transfusions - at least 2 units PRBC.  Since it is Friday, we will not be able to get her blood transfusion in time for her to receive it today, and it is not safe for her to wait until Monday. ? ?Please call her and have her come [place] for blood transfusion and possible admission to the hospital.

## 2021-05-28 NOTE — Progress Notes (Signed)
I spoke with patient during her blood transfusion today.  She reports that she felt like her blood was low because she was having increasing fatigue and dyspnea on exertion.  She notes that her stool is occasionally dark but she denies any gross hematochezia or melena.  She has an appointment with [facility] at the end of April for further evaluation of suspected chronic GI bleeding. ? ?In the meantime, we will check weekly CBCs since she is at high risk for ongoing blood loss anemia. ?She is aware of the signs and symptoms of brisk bleeding and would presented to the emergency department if she noticed any melena, hematochezia, and sudden onset weakness/fatigue. ?

## 2021-05-28 NOTE — Progress Notes (Signed)
CRITICAL VALUE ALERT ?Critical value received:  HGB 5.5 ?Date of notification:  05-28-2021 ?Time of notification: 10:44 am ?Critical value read back:  Yes.   ?Nurse who received alert:  B. Makalynn Berwanger RN ?MD notified time and response:  R. Pennington PA/ M. [place] Therapist, sports. Infuse 2 units of blood today.   ?

## 2021-05-28 NOTE — Progress Notes (Signed)
Pt presents today for 2 units of blood per provider's order.Vital signs stable. Peripheral IV started with good blood return pre and post infusion. ? ?2 units of blood given today per MD orders. Tolerated infusion without adverse affects. Vital signs stable. No complaints at this time. Discharged from clinic via wheelchair in stable condition. Alert and oriented x 3. F/U with [facility] as scheduled.   ? ? ?

## 2021-05-30 LAB — TYPE AND SCREEN
ABO/RH(D): A POS
Antibody Screen: NEGATIVE
Unit division: 0
Unit division: 0

## 2021-05-30 LAB — BPAM RBC
Blood Product Expiration Date: 202303212359
Blood Product Expiration Date: 202303212359
ISSUE DATE / TIME: 202303031155
ISSUE DATE / TIME: 202303031348
Unit Type and Rh: 6200
Unit Type and Rh: 6200

## 2021-05-31 ENCOUNTER — Inpatient Hospital Stay (HOSPITAL_COMMUNITY): Payer: Medicare PPO

## 2021-05-31 ENCOUNTER — Other Ambulatory Visit: Payer: Self-pay

## 2021-05-31 DIAGNOSIS — D500 Iron deficiency anemia secondary to blood loss (chronic): Secondary | ICD-10-CM | POA: Diagnosis not present

## 2021-05-31 DIAGNOSIS — Z79899 Other long term (current) drug therapy: Secondary | ICD-10-CM | POA: Diagnosis not present

## 2021-05-31 LAB — IRON AND TIBC
Iron: 20 ug/dL — ABNORMAL LOW (ref 28–170)
Saturation Ratios: 5 % — ABNORMAL LOW (ref 10.4–31.8)
TIBC: 440 ug/dL (ref 250–450)
UIBC: 420 ug/dL

## 2021-05-31 LAB — CBC WITH DIFFERENTIAL/PLATELET
Abs Immature Granulocytes: 0.02 10*3/uL (ref 0.00–0.07)
Basophils Absolute: 0 10*3/uL (ref 0.0–0.1)
Basophils Relative: 0 %
Eosinophils Absolute: 0.1 10*3/uL (ref 0.0–0.5)
Eosinophils Relative: 1 %
HCT: 24.1 % — ABNORMAL LOW (ref 36.0–46.0)
Hemoglobin: 6.8 g/dL — CL (ref 12.0–15.0)
Immature Granulocytes: 0 %
Lymphocytes Relative: 23 %
Lymphs Abs: 1.3 10*3/uL (ref 0.7–4.0)
MCH: 25.1 pg — ABNORMAL LOW (ref 26.0–34.0)
MCHC: 28.2 g/dL — ABNORMAL LOW (ref 30.0–36.0)
MCV: 88.9 fL (ref 80.0–100.0)
Monocytes Absolute: 0.6 10*3/uL (ref 0.1–1.0)
Monocytes Relative: 10 %
Neutro Abs: 3.5 10*3/uL (ref 1.7–7.7)
Neutrophils Relative %: 66 %
Platelets: 103 10*3/uL — ABNORMAL LOW (ref 150–400)
RBC: 2.71 MIL/uL — ABNORMAL LOW (ref 3.87–5.11)
RDW: 19.2 % — ABNORMAL HIGH (ref 11.5–15.5)
WBC: 5.4 10*3/uL (ref 4.0–10.5)
nRBC: 0 % (ref 0.0–0.2)

## 2021-05-31 LAB — PREPARE RBC (CROSSMATCH)

## 2021-05-31 LAB — FERRITIN: Ferritin: 5 ng/mL — ABNORMAL LOW (ref 11–307)

## 2021-05-31 MED ORDER — DIPHENHYDRAMINE HCL 25 MG PO CAPS
25.0000 mg | ORAL_CAPSULE | Freq: Once | ORAL | Status: AC
  Administered 2021-05-31: 25 mg via ORAL
  Filled 2021-05-31: qty 1

## 2021-05-31 MED ORDER — SODIUM CHLORIDE 0.9% IV SOLUTION
250.0000 mL | Freq: Once | INTRAVENOUS | Status: AC
  Administered 2021-05-31: 250 mL via INTRAVENOUS

## 2021-05-31 MED ORDER — ACETAMINOPHEN 325 MG PO TABS
650.0000 mg | ORAL_TABLET | Freq: Once | ORAL | Status: AC
  Administered 2021-05-31: 650 mg via ORAL
  Filled 2021-05-31: qty 2

## 2021-05-31 NOTE — Progress Notes (Unsigned)
CRITICAL VALUE ALERT ?Critical value received:  HGB 6.8 ?Date of notification:  05/31/2021 ?Time of notification: 09:54 am ?Critical value read back:  Yes.   ?Nurse who received alert:  B. Bearett Porcaro RN  ?MD notified time and response:  R. Pennington PA. Patient to come in for blood product , 2 Units.   ?

## 2021-05-31 NOTE — Progress Notes (Signed)
Patient received 2 units PRBC on Friday, 05/28/2021 due to Hgb 5.5. ?Repeat CBC today showed Hgb 6.8. ?We will transfuse an additional 2 units PRBC today due to concern for ongoing bleeding. ?Patient continues to deny any obvious hematochezia or melena. ? ?I did discuss at length with the patient that I think she would benefit from direct admission to the hospital for urgent work-up of her likely GI bleeding including likely EGD/colonoscopy by inpatient gastroenterologist.  Patient however continues to adamantly decline this.  She is frustrated that nothing was found on previous endoscopies, and wants to wait until she sees the gastroenterologist at [facility] in April.  She is aware that declining hospitalization comes with risk of further deterioration and risk of death by exsanguination.  She verbalizes willingness to "take her chances," but would like to continue with regular CBC and as needed transfusions at our clinic to bridge her over until she sees the gastroenterologist at [facility] ? ?Additional labs checked today included iron panel showing ferritin 5 and iron saturation 5%. ? ?We will continue with weekly CBC/sample to blood bank on Fridays. ?We will add IV Venofer 300 mg x 4 doses to be given on Fridays as well. ? ?Savannah Rush PA-C ?05/31/2021 1:14 PM ?

## 2021-05-31 NOTE — Patient Instructions (Signed)
[facility]  Discharge Instructions: ?Thank you for choosing [facility] to provide your oncology and hematology care.  ?If you have a lab appointment with the [facility], please come in thru the Main Entrance and check in at the main information desk. ? ?Wear comfortable clothing and clothing appropriate for easy access to any Portacath or PICC line.  ? ?We strive to give you quality time with your provider. You may need to reschedule your appointment if you arrive late (15 or more minutes).  Arriving late affects you and other patients whose appointments are after yours.  Also, if you miss three or more appointments without notifying the office, you may be dismissed from the clinic at the provider?s discretion.    ?  ?For prescription refill requests, have your pharmacy contact our office and allow 72 hours for refills to be completed.   ? ?Today you received 2 units of blood ?  ? ?BELOW ARE SYMPTOMS THAT SHOULD BE REPORTED IMMEDIATELY: ?*FEVER GREATER THAN 100.4 F (38 ?C) OR HIGHER ?*CHILLS OR SWEATING ?*NAUSEA AND VOMITING THAT IS NOT CONTROLLED WITH YOUR NAUSEA MEDICATION ?*UNUSUAL SHORTNESS OF BREATH ?*UNUSUAL BRUISING OR BLEEDING ?*URINARY PROBLEMS (pain or burning when urinating, or frequent urination) ?*BOWEL PROBLEMS (unusual diarrhea, constipation, pain near the anus) ?TENDERNESS IN MOUTH AND THROAT WITH OR WITHOUT PRESENCE OF ULCERS (sore throat, sores in mouth, or a toothache) ?UNUSUAL RASH, SWELLING OR PAIN  ?UNUSUAL VAGINAL DISCHARGE OR ITCHING  ? ?Items with * indicate a potential emergency and should be followed up as soon as possible or go to the Emergency Department if any problems should occur. ? ?Please show the CHEMOTHERAPY ALERT CARD or IMMUNOTHERAPY ALERT CARD at check-in to the Emergency Department and triage nurse. ? ?Should you have questions after your visit or need to cancel or reschedule your appointment, please contact [facility] [phone]   and follow the prompts.  Office hours are 8:00 a.m. to 4:30 p.m. Monday - Friday. Please note that voicemails left after 4:00 p.m. may not be returned until the following business day.  We are closed weekends and major holidays. You have access to a nurse at all times for urgent questions. Please call the main number to the clinic [phone] and follow the prompts. ? ?For any non-urgent questions, you may also contact your provider using MyChart. We now offer e-Visits for anyone 42 and older to request care online for non-urgent symptoms. For details visit [email or web address]. ?  ?Also download the MyChart app! Go to the app store, search "MyChart", open the app, select [facility], and log in with your MyChart username and password. ? ?Due to Covid, a mask is required upon entering the hospital/clinic. If you do not have a mask, one will be given to you upon arrival. For doctor visits, patients may have 1 support person aged 69 or older with them. For treatment visits, patients cannot have anyone with them due to current Covid guidelines and our immunocompromised population.  ?

## 2021-05-31 NOTE — Progress Notes (Signed)
Pt presents today for possible blood transfusion per provider's order. Vital signs stable and pt voiced no new complaints at this time. Pt's hemoglobin is 6.8 today. R.Pennington,PA stated to give 2 units of blood today.  ? ?Peripheral IV started with good blood return pre and post infusion. ? ?2 units of blood given today per MD orders. Tolerated infusion without adverse affects. Vital signs stable. No complaints at this time. Discharged from clinic via wheelchair in stable condition. Alert and oriented x 3. F/U with [facility] as scheduled.   ?

## 2021-06-01 LAB — BPAM RBC
Blood Product Expiration Date: 202303222359
Blood Product Expiration Date: 202303222359
ISSUE DATE / TIME: 202303061240
ISSUE DATE / TIME: 202303061423
Unit Type and Rh: 6200
Unit Type and Rh: 6200

## 2021-06-01 LAB — TYPE AND SCREEN
ABO/RH(D): A POS
Antibody Screen: NEGATIVE
Unit division: 0
Unit division: 0

## 2021-06-04 ENCOUNTER — Other Ambulatory Visit: Payer: Self-pay

## 2021-06-04 ENCOUNTER — Inpatient Hospital Stay (HOSPITAL_COMMUNITY): Payer: Medicare PPO

## 2021-06-04 ENCOUNTER — Other Ambulatory Visit (HOSPITAL_COMMUNITY): Payer: Medicare PPO

## 2021-06-04 VITALS — BP 146/72 | HR 85 | Temp 97.9°F | Resp 18

## 2021-06-04 DIAGNOSIS — D500 Iron deficiency anemia secondary to blood loss (chronic): Secondary | ICD-10-CM

## 2021-06-04 DIAGNOSIS — Z79899 Other long term (current) drug therapy: Secondary | ICD-10-CM | POA: Diagnosis not present

## 2021-06-04 LAB — CBC WITH DIFFERENTIAL/PLATELET
Abs Immature Granulocytes: 0.01 10*3/uL (ref 0.00–0.07)
Basophils Absolute: 0 10*3/uL (ref 0.0–0.1)
Basophils Relative: 0 %
Eosinophils Absolute: 0.1 10*3/uL (ref 0.0–0.5)
Eosinophils Relative: 1 %
HCT: 27.9 % — ABNORMAL LOW (ref 36.0–46.0)
Hemoglobin: 8 g/dL — ABNORMAL LOW (ref 12.0–15.0)
Immature Granulocytes: 0 %
Lymphocytes Relative: 22 %
Lymphs Abs: 1.2 10*3/uL (ref 0.7–4.0)
MCH: 26.1 pg (ref 26.0–34.0)
MCHC: 28.7 g/dL — ABNORMAL LOW (ref 30.0–36.0)
MCV: 91.2 fL (ref 80.0–100.0)
Monocytes Absolute: 0.5 10*3/uL (ref 0.1–1.0)
Monocytes Relative: 10 %
Neutro Abs: 3.5 10*3/uL (ref 1.7–7.7)
Neutrophils Relative %: 67 %
Platelets: 93 10*3/uL — ABNORMAL LOW (ref 150–400)
RBC: 3.06 MIL/uL — ABNORMAL LOW (ref 3.87–5.11)
RDW: 18.4 % — ABNORMAL HIGH (ref 11.5–15.5)
WBC: 5.3 10*3/uL (ref 4.0–10.5)
nRBC: 0 % (ref 0.0–0.2)

## 2021-06-04 LAB — SAMPLE TO BLOOD BANK

## 2021-06-04 MED ORDER — SODIUM CHLORIDE 0.9 % IV SOLN
300.0000 mg | Freq: Once | INTRAVENOUS | Status: AC
  Administered 2021-06-04: 300 mg via INTRAVENOUS
  Filled 2021-06-04: qty 300

## 2021-06-04 MED ORDER — LORATADINE 10 MG PO TABS
10.0000 mg | ORAL_TABLET | Freq: Once | ORAL | Status: AC
  Administered 2021-06-04: 10 mg via ORAL
  Filled 2021-06-04: qty 1

## 2021-06-04 MED ORDER — SODIUM CHLORIDE 0.9 % IV SOLN: Freq: Once | INTRAVENOUS | Status: AC

## 2021-06-04 NOTE — Patient Instructions (Signed)
[facility]  Discharge Instructions: ?Thank you for choosing [facility] to provide your oncology and hematology care.  ?If you have a lab appointment with the [facility], please come in thru the Main Entrance and check in at the main information desk. ? ?Wear comfortable clothing and clothing appropriate for easy access to any Portacath or PICC line.  ? ?We strive to give you quality time with your provider. You may need to reschedule your appointment if you arrive late (15 or more minutes).  Arriving late affects you and other patients whose appointments are after yours.  Also, if you miss three or more appointments without notifying the office, you may be dismissed from the clinic at the provider?s discretion.    ?  ?For prescription refill requests, have your pharmacy contact our office and allow 72 hours for refills to be completed.   ? ?Today you received the following chemotherapy and/or immunotherapy agents Venofer    ?  ?To help prevent nausea and vomiting after your treatment, we encourage you to take your nausea medication as directed. ? ?BELOW ARE SYMPTOMS THAT SHOULD BE REPORTED IMMEDIATELY: ?*FEVER GREATER THAN 100.4 F (38 ?C) OR HIGHER ?*CHILLS OR SWEATING ?*NAUSEA AND VOMITING THAT IS NOT CONTROLLED WITH YOUR NAUSEA MEDICATION ?*UNUSUAL SHORTNESS OF BREATH ?*UNUSUAL BRUISING OR BLEEDING ?*URINARY PROBLEMS (pain or burning when urinating, or frequent urination) ?*BOWEL PROBLEMS (unusual diarrhea, constipation, pain near the anus) ?TENDERNESS IN MOUTH AND THROAT WITH OR WITHOUT PRESENCE OF ULCERS (sore throat, sores in mouth, or a toothache) ?UNUSUAL RASH, SWELLING OR PAIN  ?UNUSUAL VAGINAL DISCHARGE OR ITCHING  ? ?Items with * indicate a potential emergency and should be followed up as soon as possible or go to the Emergency Department if any problems should occur. ? ?Please show the CHEMOTHERAPY ALERT CARD or IMMUNOTHERAPY ALERT CARD at check-in to the Emergency  Department and triage nurse. ? ?Should you have questions after your visit or need to cancel or reschedule your appointment, please contact [facility] [phone]  and follow the prompts.  Office hours are 8:00 a.m. to 4:30 p.m. Monday - Friday. Please note that voicemails left after 4:00 p.m. may not be returned until the following business day.  We are closed weekends and major holidays. You have access to a nurse at all times for urgent questions. Please call the main number to the clinic [phone] and follow the prompts. ? ?For any non-urgent questions, you may also contact your provider using MyChart. We now offer e-Visits for anyone 72 and older to request care online for non-urgent symptoms. For details visit [email or web address] ?  ?Also download the MyChart app! Go to the app store, search "MyChart", open the app, select [facility], and log in with your MyChart username and password. ? ?Due to Covid, a mask is required upon entering the hospital/clinic. If you do not have a mask, one will be given to you upon arrival. For doctor visits, patients may have 1 support person aged 65 or older with them. For treatment visits, patients cannot have anyone with them due to current Covid guidelines and our immunocompromised population.  ?

## 2021-06-04 NOTE — Progress Notes (Signed)
Patient presents today for Venofer infusion and possible blood transfusion per providers order.  Vital signs WNL.  Patient has no new complaints at this time. ? ?Hgb noted to be 8.0 today, per parameters no blood transfusion needed. ? ? ?Peripheral IV started and blood return noted pre and post infusion.   ? ?Venofer infusion given today per MD orders.  Tolerated infusion without adverse affects.  Vital signs stable.  No complaints at this time.  Discharge from clinic via wheelchair in stable condition.  Alert and oriented X 3.  Follow up with [facility] as scheduled.  ?

## 2021-06-10 ENCOUNTER — Other Ambulatory Visit (HOSPITAL_COMMUNITY): Payer: Medicare PPO

## 2021-06-11 ENCOUNTER — Inpatient Hospital Stay (HOSPITAL_COMMUNITY): Payer: Medicare PPO

## 2021-06-11 ENCOUNTER — Other Ambulatory Visit: Payer: Self-pay

## 2021-06-11 ENCOUNTER — Other Ambulatory Visit (HOSPITAL_COMMUNITY): Payer: Medicare PPO

## 2021-06-11 VITALS — BP 145/66 | HR 76 | Temp 98.6°F | Resp 20

## 2021-06-11 DIAGNOSIS — D500 Iron deficiency anemia secondary to blood loss (chronic): Secondary | ICD-10-CM

## 2021-06-11 DIAGNOSIS — Z79899 Other long term (current) drug therapy: Secondary | ICD-10-CM | POA: Diagnosis not present

## 2021-06-11 LAB — CBC WITH DIFFERENTIAL/PLATELET
Abs Immature Granulocytes: 0.04 10*3/uL (ref 0.00–0.07)
Basophils Absolute: 0 10*3/uL (ref 0.0–0.1)
Basophils Relative: 0 %
Eosinophils Absolute: 0 10*3/uL (ref 0.0–0.5)
Eosinophils Relative: 1 %
HCT: 27.4 % — ABNORMAL LOW (ref 36.0–46.0)
Hemoglobin: 7.7 g/dL — ABNORMAL LOW (ref 12.0–15.0)
Immature Granulocytes: 1 %
Lymphocytes Relative: 19 %
Lymphs Abs: 1.1 10*3/uL (ref 0.7–4.0)
MCH: 26.7 pg (ref 26.0–34.0)
MCHC: 28.1 g/dL — ABNORMAL LOW (ref 30.0–36.0)
MCV: 95.1 fL (ref 80.0–100.0)
Monocytes Absolute: 0.5 10*3/uL (ref 0.1–1.0)
Monocytes Relative: 9 %
Neutro Abs: 4 10*3/uL (ref 1.7–7.7)
Neutrophils Relative %: 70 %
Platelets: 122 10*3/uL — ABNORMAL LOW (ref 150–400)
RBC: 2.88 MIL/uL — ABNORMAL LOW (ref 3.87–5.11)
RDW: 22.2 % — ABNORMAL HIGH (ref 11.5–15.5)
WBC: 5.7 10*3/uL (ref 4.0–10.5)
nRBC: 0 % (ref 0.0–0.2)

## 2021-06-11 LAB — SAMPLE TO BLOOD BANK

## 2021-06-11 MED ORDER — SODIUM CHLORIDE 0.9 % IV SOLN: Freq: Once | INTRAVENOUS | Status: AC

## 2021-06-11 MED ORDER — SODIUM CHLORIDE 0.9 % IV SOLN
300.0000 mg | Freq: Once | INTRAVENOUS | Status: AC
  Administered 2021-06-11: 300 mg via INTRAVENOUS
  Filled 2021-06-11: qty 300

## 2021-06-11 MED ORDER — LORATADINE 10 MG PO TABS
10.0000 mg | ORAL_TABLET | Freq: Once | ORAL | Status: AC
  Administered 2021-06-11: 10 mg via ORAL
  Filled 2021-06-11: qty 1

## 2021-06-11 NOTE — Progress Notes (Signed)
Chaplain engaged in a follow-up visit with Savannah Burns.  Savannah Burns immediately shared about the state of her health and her current needs for blood transfusions and iron.  She spoke about an upcoming appointment in April at [facility] and how she is hopeful that they will find out what is going on and fix it.  She currently feels exhausted.  Chaplain offered reflective listening as she shared about not being ready to die just yet because there are some things she has left to do.   ? ?Savannah Burns spent a lot of time thinking about her family.  She desires for them to be in a better place before she passes.  She voiced, "I won't be able to have peace because of my daughter."  Savannah Burns has reflected before on the relationship she shares with one of her daughters and how painful it has been for her.  She desires more for her daughter.  She also expressed worry for her granddaughter.  Chaplain assesses that Savannah Burns truly wants more for them but has trouble understanding the limits of her control in their lives.  ? ?Chaplain continues to provide a space for Savannah Burns to candidly share what is happening in her life.  Savannah Burns seemingly wants peace, more help, and to be able to do more for her family but doesn't know how to obtain that.  Chaplain offers perspective, listening, presence and support. ? ? ? 06/11/21 1100  ?Clinical Encounter Type  ?Visited With Patient  ?Visit Type Follow-up;Spiritual support  ?Stress Factors  ?Patient Stress Factors Major life changes;Health changes;Family relationships;Exhausted  ? ? ?

## 2021-06-11 NOTE — Progress Notes (Signed)
Iron infusion given per orders. Patient tolerated it well without problems. Vitals stable and discharged home from clinic via wheelchair. Follow up as scheduled.  

## 2021-06-11 NOTE — Patient Instructions (Signed)
[facility]  Discharge Instructions: ?Thank you for choosing [facility] to provide your oncology and hematology care.  ?If you have a lab appointment with the [facility], please come in thru the Main Entrance and check in at the main information desk. ? ? ? ?We strive to give you quality time with your provider. You may need to reschedule your appointment if you arrive late (15 or more minutes).  Arriving late affects you and other patients whose appointments are after yours.  Also, if you miss three or more appointments without notifying the office, you may be dismissed from the clinic at the provider?s discretion.    ?  ?For prescription refill requests, have your pharmacy contact our office and allow 72 hours for refills to be completed.   ? ?  ?To help prevent nausea and vomiting after your treatment, we encourage you to take your nausea medication as directed. ? ?BELOW ARE SYMPTOMS THAT SHOULD BE REPORTED IMMEDIATELY: ?*FEVER GREATER THAN 100.4 F (38 ?C) OR HIGHER ?*CHILLS OR SWEATING ?*NAUSEA AND VOMITING THAT IS NOT CONTROLLED WITH YOUR NAUSEA MEDICATION ?*UNUSUAL SHORTNESS OF BREATH ?*UNUSUAL BRUISING OR BLEEDING ?*URINARY PROBLEMS (pain or burning when urinating, or frequent urination) ?*BOWEL PROBLEMS (unusual diarrhea, constipation, pain near the anus) ?TENDERNESS IN MOUTH AND THROAT WITH OR WITHOUT PRESENCE OF ULCERS (sore throat, sores in mouth, or a toothache) ?UNUSUAL RASH, SWELLING OR PAIN  ?UNUSUAL VAGINAL DISCHARGE OR ITCHING  ? ?Items with * indicate a potential emergency and should be followed up as soon as possible or go to the Emergency Department if any problems should occur. ? ?Please show the CHEMOTHERAPY ALERT CARD or IMMUNOTHERAPY ALERT CARD at check-in to the Emergency Department and triage nurse. ? ?Should you have questions after your visit or need to cancel or reschedule your appointment, please contact [facility] [phone]  and follow  the prompts.  Office hours are 8:00 a.m. to 4:30 p.m. Monday - Friday. Please note that voicemails left after 4:00 p.m. may not be returned until the following business day.  We are closed weekends and major holidays. You have access to a nurse at all times for urgent questions. Please call the main number to the clinic [phone] and follow the prompts. ? ?For any non-urgent questions, you may also contact your provider using MyChart. We now offer e-Visits for anyone 97 and older to request care online for non-urgent symptoms. For details visit [email or web address]. ?  ?Also download the MyChart app! Go to the app store, search "MyChart", open the app, select [facility], and log in with your MyChart username and password. ? ?Due to Covid, a mask is required upon entering the hospital/clinic. If you do not have a mask, one will be given to you upon arrival. For doctor visits, patients may have 1 support person aged 6 or older with them. For treatment visits, patients cannot have anyone with them due to current Covid guidelines and our immunocompromised population.  ?

## 2021-06-14 ENCOUNTER — Other Ambulatory Visit (HOSPITAL_COMMUNITY): Payer: Self-pay

## 2021-06-14 ENCOUNTER — Ambulatory Visit (INDEPENDENT_AMBULATORY_CARE_PROVIDER_SITE_OTHER): Payer: Medicare PPO | Admitting: Licensed Clinical Social Worker

## 2021-06-14 DIAGNOSIS — I10 Essential (primary) hypertension: Secondary | ICD-10-CM

## 2021-06-14 DIAGNOSIS — J449 Chronic obstructive pulmonary disease, unspecified: Secondary | ICD-10-CM

## 2021-06-14 DIAGNOSIS — D649 Anemia, unspecified: Secondary | ICD-10-CM

## 2021-06-14 DIAGNOSIS — E1169 Type 2 diabetes mellitus with other specified complication: Secondary | ICD-10-CM

## 2021-06-14 DIAGNOSIS — F331 Major depressive disorder, recurrent, moderate: Secondary | ICD-10-CM

## 2021-06-14 DIAGNOSIS — I5032 Chronic diastolic (congestive) heart failure: Secondary | ICD-10-CM

## 2021-06-14 DIAGNOSIS — D500 Iron deficiency anemia secondary to blood loss (chronic): Secondary | ICD-10-CM

## 2021-06-14 NOTE — Chronic Care Management (AMB) (Signed)
?Chronic Care Management  ? ? Clinical Social Work Note ? ?06/14/2021 ?Name: Savannah Burns MRN: [id number] DOB: 16-Jun-1950 ? ?Savannah Burns is a 71 y.o. year old female who is a primary care patient of [name], DO. The CCM team was consulted to assist the patient with chronic disease management and/or care coordination needs related to: [employer] .  ? ?Engaged with patient by telephone for follow up visit in response to provider referral for social work chronic care management and care coordination services.  ? ?Consent to Services:  ?The patient was given information about Chronic Care Management services, agreed to services, and gave verbal consent prior to initiation of services.  Please see initial visit note for detailed documentation.  ? ?Patient agreed to services and consent obtained.  ? ?Assessment: Review of patient past medical history, allergies, medications, and health status, including review of relevant consultants reports was performed today as part of a comprehensive evaluation and provision of chronic care management and care coordination services.    ? ?SDOH (Social Determinants of Health) assessments and interventions performed:  ?SDOH Interventions   ? ?Flowsheet Row Most Recent Value  ?SDOH Interventions   ?Physical Activity Interventions Other (Comments)  [walking challenges. she uses a walker to help her walk]  ?Stress Interventions Provide Counseling  [client has stress related to managing medical needs]  ?Depression Interventions/Treatment  Counseling, Medication  ? ?  ?  ? ?Advanced Directives Status: See Vynca application for related entries. ? ?CCM Care Plan ? ?Allergies  ?Allergen Reactions  ? Keflex [Cephalexin] Nausea And Vomiting  ? ? ?Outpatient Encounter Medications as of 06/14/2021  ?Medication Sig  ? baclofen (LIORESAL) 10 MG tablet Take by mouth.  ? Blood Glucose Calibration (GLUCOMETER DEX HIGH CONTROL) LIQD Test CBG's once daily.   ? Blood Glucose  Monitoring Suppl (TRUE METRIX AIR GLUCOSE METER) w/Device KIT Check BS daily Dx E11.9  ? Cholecalciferol (VITAMIN D) 50 MCG (2000 UT) tablet Take 4,000 Units by mouth daily.  ? desvenlafaxine (PRISTIQ) 50 MG 24 hr tablet Take 1 tablet (50 mg total) by mouth daily.  ? erythromycin ophthalmic ointment Place 1 application into the left eye at bedtime.  ? esomeprazole (NEXIUM) 20 MG capsule Take 1 capsule (20 mg total) by mouth daily at 12 noon.  ? ferrous sulfate 325 (65 FE) MG tablet Take 1 tablet (325 mg total) by mouth 2 (two) times daily with a meal. (Patient taking differently: Take 325 mg by mouth daily with breakfast.)  ? furosemide (LASIX) 20 MG tablet Take 20 mg by mouth. bid  ? glucose blood (TRUE METRIX BLOOD GLUCOSE TEST) test strip Check BS daily Dx E11.9  ? lovastatin (MEVACOR) 20 MG tablet Take 1 tablet (20 mg total) by mouth at bedtime.  ? oxyCODONE-acetaminophen (PERCOCET) 10-325 MG tablet Take 1 tablet by mouth every 6 (six) hours as needed for pain.  ? OXYGEN Inhale 3 L into the lungs continuous.  ? potassium chloride SA (KLOR-CON) 20 MEQ tablet Take 2 tablets (40 mEq total) by mouth 2 (two) times daily. (Patient taking differently: Take 40 mEq by mouth daily.)  ? Semaglutide (RYBELSUS) 3 MG TABS Take 3 mg by mouth daily.  ? torsemide (DEMADEX) 20 MG tablet Take 3 tablets (60 mg total) by mouth 2 (two) times daily. (Patient taking differently: Take 50 mg by mouth 2 (two) times daily.)  ? TRUEplus Lancets 30G MISC Check BS daily Dx E11.9  ? vitamin B-12 (CYANOCOBALAMIN) 1000 MCG tablet Take  2,000 mcg by mouth daily.  ? [DISCONTINUED] albuterol (PROVENTIL HFA;VENTOLIN HFA) 108 (90 Base) MCG/ACT inhaler Inhale 2 puffs into the lungs every 6 (six) hours as needed for wheezing or shortness of breath.  ? ?No facility-administered encounter medications on file as of 06/14/2021.  ? ? ?Patient Active Problem List  ? Diagnosis Date Noted  ? Hypoalbuminemia 05/17/2020  ? Moderate protein malnutrition ([place])  05/17/2020  ? Melena 05/16/2020  ? Hyperlipidemia   ? Hypokalemia   ? GI bleed 03/20/2020  ? Lower GI bleed 03/19/2020  ? Acute on chronic congestive heart failure ([place])   ? Symptomatic anemia   ? Iron deficiency anemia   ? Heme positive stool   ? ABLA (acute blood loss anemia) 01/22/2020  ? Supplemental oxygen dependent 10/22/2019  ? Chronic dyspnea 10/22/2019  ? Hypertension associated with diabetes ([place]) 10/22/2019  ? Bilateral hand pain 09/21/2016  ? Fatigue 07/30/2015  ? Chronic diastolic HF (heart failure) ([place]) 06/29/2015  ? Postmenopausal bleeding 04/17/2015  ? Excessive daytime sleepiness 12/19/2014  ? Swelling of lower extremity 11/27/2014  ? Back pain 06/13/2013  ? Sinusitis, chronic 05/30/2012  ? Allergic rhinitis 06/06/2011  ? Hyperlipidemia associated with type 2 diabetes mellitus ([place]) 03/24/2009  ? Diabetes mellitus, type 2 ([place]) 08/12/2008  ? Pulmonary nodule 08/29/2007  ? COPD (chronic obstructive pulmonary disease) ([place]) 02/14/2007  ? Class 3 obesity ([place]) 05/25/2006  ? TOBACCO DEPENDENCE 05/25/2006  ? Depression, recurrent ([place]) 05/25/2006  ? HYPERTENSION, BENIGN SYSTEMIC 05/25/2006  ? DJD, UNSPECIFIED 05/25/2006  ? ? ?Conditions to be addressed/monitored: monitor client management of depression issues and of anxiety issues ? ?Care Plan : LCSW Care Plan  ?Updates made by Katha Cabal, LCSW since 06/14/2021 12:00 AM  ?  ? ?Problem: Emotional Distress   ?  ? ?Goal: Emotional Health Supported. Manage Depression issues. Manage anxiety issues   ?Start Date: 06/14/2021  ?Expected End Date: 09/08/2021  ?This Visit's Progress: On track  ?Priority: Medium  ?Note:   ?Current Barriers:  ?Depression issues ?Anxiety issues ?Mobility issues ?Suicidal Ideation/Homicidal Ideation: No ? ?Clinical Social Work Goal(s):  ?patient will work with SW monthly by telephone or in person to reduce or manage symptoms related to depression and anxiety issues faced ?Patient will call RNCM as needed in next 30 days for CCM  nursing support ?Patient will practice self care activities in next 30 days ?Patient will attend scheduled medical appointments in next 30 days ? ?Interventions: ?1:1 collaboration with [name], DO regarding development and update of comprehensive plan of care as evidenced by provider attestation and co-signature ?Discussed client needs with [facility] ?Discussed iron infusions of client. She said she has been receiving more iron infusions recently.  She said she has a blood test scheduled for today ?Reviewed her plans to see Gastroenterologist She said she has an appointment on July [facility], 2021 [facility] with a Copywriter, advertising.  ?Reviewed family support . She has support from her youngest daughter. Client has support from her granddaughter, Ishmael Holter ?Discussed meal preparation for client. Discussed medication procurement for client ?Provided counseling support for client ?Reviewed mood of client. She said she thought her mood was stable. She said she thinks that Pristiq prescribed is helping her mood.  She did not mention any mood issues ?Reviewed energy level. She said she is often fatigued.   ?Encouraged [name] to call RNCM as needed in next 30 days for nursing support ? ?Patient Coping Skills; ? ?Takes medications as prescribed ?Tries to attend medical  appointments ? ?Patient Self Care Deficits:  ?Mobility issues ?Depression issues ?Anxiety issues ? ?Patient Goals:  ?- spend time or talk with others at least 2 to 3 times per week ?- practice relaxation or meditation daily ?- keep a calendar with appointment dates ? ?Follow Up Plan: LCSW to call client on 08/09/21 at 1:00 PM   ? ?[name].Elek Holderness MSW, LCSW ?Licensed Clinical Social Worker ?[facility] ?[phone] ?

## 2021-06-14 NOTE — Patient Instructions (Addendum)
Visit Information ? ?Patient Goals: Protect My Health (Patient). Manage Depression issues. Manage anxiety issues faced. Manage in home care needs ? ?Timeframe:  Short-Term Goal ?Priority:  Medium ?Progress: On Track ?Start Date:       06/14/21                 ?Expected End Date:    09/09/21          ? ?Follow Up Date:   08/09/21 at 1:00 PM    ? ?Protect  My Health (Patient) Manage Depression issues . Manage anxiety issues faced; Manage in home care needs ?  ?Why is this important?   ?Screening tests can find diseases early when they are easier to treat.  ?Your doctor or nurse will talk with you about which tests are important for you.  ?Getting shots for common diseases like the flu and shingles will help prevent them.   ?  ?Patient Self Care Activities:  ?Self administers medications as prescribed ?Attends all scheduled provider appointments ?Performs ADL's independently ? ?Patient Coping Strengths:  ?Family ?Friends ?Church friends ? ?Patient Self Care Deficits:  ?Depression issues ?Mobility issues ?Low energy ? ?Patient Goals:  ?- spend time or talk with others at least 2 to 3 times per week ?- practice relaxation or meditation daily ?- keep a calendar with appointment dates ? ?Follow Up Plan: LCSW to call client on 08/09/21 at 1:00 PM to assess client needs  ? ?[name].Jacky Dross MSW, LCSW ?Licensed Clinical Social Worker ?[facility] ?[phone] ?

## 2021-06-15 ENCOUNTER — Other Ambulatory Visit: Payer: Self-pay

## 2021-06-15 ENCOUNTER — Inpatient Hospital Stay (HOSPITAL_COMMUNITY): Payer: Medicare PPO

## 2021-06-15 ENCOUNTER — Encounter (HOSPITAL_COMMUNITY): Payer: Self-pay

## 2021-06-15 DIAGNOSIS — D649 Anemia, unspecified: Secondary | ICD-10-CM

## 2021-06-15 DIAGNOSIS — D500 Iron deficiency anemia secondary to blood loss (chronic): Secondary | ICD-10-CM | POA: Diagnosis not present

## 2021-06-15 DIAGNOSIS — Z79899 Other long term (current) drug therapy: Secondary | ICD-10-CM | POA: Diagnosis not present

## 2021-06-15 LAB — CBC
HCT: 25 % — ABNORMAL LOW (ref 36.0–46.0)
Hemoglobin: 6.7 g/dL — CL (ref 12.0–15.0)
MCH: 25.8 pg — ABNORMAL LOW (ref 26.0–34.0)
MCHC: 26.8 g/dL — ABNORMAL LOW (ref 30.0–36.0)
MCV: 96.2 fL (ref 80.0–100.0)
Platelets: 129 10*3/uL — ABNORMAL LOW (ref 150–400)
RBC: 2.6 MIL/uL — ABNORMAL LOW (ref 3.87–5.11)
RDW: 23.8 % — ABNORMAL HIGH (ref 11.5–15.5)
WBC: 6.4 10*3/uL (ref 4.0–10.5)
nRBC: 0 % (ref 0.0–0.2)

## 2021-06-15 LAB — PREPARE RBC (CROSSMATCH)

## 2021-06-15 MED ORDER — ACETAMINOPHEN 325 MG PO TABS
650.0000 mg | ORAL_TABLET | Freq: Once | ORAL | Status: AC
  Administered 2021-06-15: 650 mg via ORAL
  Filled 2021-06-15: qty 2

## 2021-06-15 MED ORDER — SODIUM CHLORIDE 0.9% IV SOLUTION
250.0000 mL | Freq: Once | INTRAVENOUS | Status: AC
  Administered 2021-06-15: 250 mL via INTRAVENOUS

## 2021-06-15 MED ORDER — DIPHENHYDRAMINE HCL 25 MG PO CAPS
25.0000 mg | ORAL_CAPSULE | Freq: Once | ORAL | Status: AC
  Administered 2021-06-15: 25 mg via ORAL
  Filled 2021-06-15: qty 1

## 2021-06-15 NOTE — Patient Instructions (Signed)
[facility]  Discharge Instructions: ?Thank you for choosing [facility] to provide your oncology and hematology care.  ?If you have a lab appointment with the [facility], please come in thru the Main Entrance and check in at the main information desk. ? ?Wear comfortable clothing and clothing appropriate for easy access to any Portacath or PICC line.  ? ?We strive to give you quality time with your provider. You may need to reschedule your appointment if you arrive late (15 or more minutes).  Arriving late affects you and other patients whose appointments are after yours.  Also, if you miss three or more appointments without notifying the office, you may be dismissed from the clinic at the provider?s discretion.    ?  ?For prescription refill requests, have your pharmacy contact our office and allow 72 hours for refills to be completed.   ? ?Today you received the following : 2 Units of PRBC's.     ?  ?To help prevent nausea and vomiting after your treatment, we encourage you to take your nausea medication as directed. ? ?BELOW ARE SYMPTOMS THAT SHOULD BE REPORTED IMMEDIATELY: ?*FEVER GREATER THAN 100.4 F (38 ?C) OR HIGHER ?*CHILLS OR SWEATING ?*NAUSEA AND VOMITING THAT IS NOT CONTROLLED WITH YOUR NAUSEA MEDICATION ?*UNUSUAL SHORTNESS OF BREATH ?*UNUSUAL BRUISING OR BLEEDING ?*URINARY PROBLEMS (pain or burning when urinating, or frequent urination) ?*BOWEL PROBLEMS (unusual diarrhea, constipation, pain near the anus) ?TENDERNESS IN MOUTH AND THROAT WITH OR WITHOUT PRESENCE OF ULCERS (sore throat, sores in mouth, or a toothache) ?UNUSUAL RASH, SWELLING OR PAIN  ?UNUSUAL VAGINAL DISCHARGE OR ITCHING  ? ?Items with * indicate a potential emergency and should be followed up as soon as possible or go to the Emergency Department if any problems should occur. ? ?Please show the CHEMOTHERAPY ALERT CARD or IMMUNOTHERAPY ALERT CARD at check-in to the Emergency Department and triage  nurse. ? ?Should you have questions after your visit or need to cancel or reschedule your appointment, please contact [facility] [phone]  and follow the prompts.  Office hours are 8:00 a.m. to 4:30 p.m. Monday - Friday. Please note that voicemails left after 4:00 p.m. may not be returned until the following business day.  We are closed weekends and major holidays. You have access to a nurse at all times for urgent questions. Please call the main number to the clinic [phone] and follow the prompts. ? ?For any non-urgent questions, you may also contact your provider using MyChart. We now offer e-Visits for anyone 13 and older to request care online for non-urgent symptoms. For details visit [email or web address] ?  ?Also download the MyChart app! Go to the app store, search "MyChart", open the app, select [facility], and log in with your MyChart username and password. ? ?Due to Covid, a mask is required upon entering the hospital/clinic. If you do not have a mask, one will be given to you upon arrival. For doctor visits, patients may have 1 support person aged 34 or older with them. For treatment visits, patients cannot have anyone with them due to current Covid guidelines and our immunocompromised population.  ?

## 2021-06-15 NOTE — Progress Notes (Signed)
CRITICAL VALUE ALERT ?Critical value received:  hgb 6.7 ?Date of notification:  06-15-21 ?Time of notification: 55 ?Critical value read back:  Yes.   ?Nurse who received alert:  C. Lenford Beddow RN ?MD notified time and response:  8295, will give 2 units per Reb. Pennington PA- ?C ? ? ?

## 2021-06-15 NOTE — Progress Notes (Signed)
Patient presents today for possible blood products. HGB today 6.7. Per R. Pennington PA, infuse 2 Units of blood today.  ? ?2 units of blood given today per MD orders. Tolerated infusion without adverse affects. Vital signs stable. No complaints at this time. Discharged from clinic by wheel chair in stable condition. Alert and oriented x 3. F/U with [facility] as scheduled.   ?

## 2021-06-16 LAB — BPAM RBC
Blood Product Expiration Date: 202304132359
Blood Product Expiration Date: 202304132359
ISSUE DATE / TIME: 202303211033
ISSUE DATE / TIME: 202303211224
Unit Type and Rh: 6200
Unit Type and Rh: 6200

## 2021-06-16 LAB — TYPE AND SCREEN
ABO/RH(D): A POS
Antibody Screen: NEGATIVE
Unit division: 0
Unit division: 0

## 2021-06-17 ENCOUNTER — Telehealth: Payer: Self-pay

## 2021-06-17 NOTE — Telephone Encounter (Signed)
-----   Message from Savannah Norlander, DO sent at 06/16/2021  4:32 PM EDT ----- ?Please inform [name] that I received a message from [facility] re: back imaging that showed what looks like an aortic aneurysm.  I recommend that she have an aortic ultrasound to further evaluate if she is willing.  Please let me know and if she is agreeable to [facility] or [facility] for imaging. ? ?

## 2021-06-17 NOTE — Telephone Encounter (Signed)
lmtcb

## 2021-06-18 ENCOUNTER — Other Ambulatory Visit (HOSPITAL_COMMUNITY): Payer: Medicare PPO

## 2021-06-18 ENCOUNTER — Inpatient Hospital Stay (HOSPITAL_COMMUNITY): Payer: Medicare PPO

## 2021-06-18 ENCOUNTER — Other Ambulatory Visit (HOSPITAL_COMMUNITY): Payer: Self-pay | Admitting: Physician Assistant

## 2021-06-18 ENCOUNTER — Other Ambulatory Visit: Payer: Self-pay

## 2021-06-18 VITALS — BP 138/60 | HR 78 | Temp 97.7°F | Resp 20

## 2021-06-18 DIAGNOSIS — D500 Iron deficiency anemia secondary to blood loss (chronic): Secondary | ICD-10-CM

## 2021-06-18 DIAGNOSIS — D649 Anemia, unspecified: Secondary | ICD-10-CM

## 2021-06-18 DIAGNOSIS — Z79899 Other long term (current) drug therapy: Secondary | ICD-10-CM | POA: Diagnosis not present

## 2021-06-18 LAB — CBC
HCT: 27.5 % — ABNORMAL LOW (ref 36.0–46.0)
Hemoglobin: 7.7 g/dL — ABNORMAL LOW (ref 12.0–15.0)
MCH: 26.5 pg (ref 26.0–34.0)
MCHC: 28 g/dL — ABNORMAL LOW (ref 30.0–36.0)
MCV: 94.5 fL (ref 80.0–100.0)
Platelets: 114 10*3/uL — ABNORMAL LOW (ref 150–400)
RBC: 2.91 MIL/uL — ABNORMAL LOW (ref 3.87–5.11)
RDW: 23.5 % — ABNORMAL HIGH (ref 11.5–15.5)
WBC: 4.5 10*3/uL (ref 4.0–10.5)
nRBC: 0 % (ref 0.0–0.2)

## 2021-06-18 LAB — PREPARE RBC (CROSSMATCH)

## 2021-06-18 MED ORDER — LORATADINE 10 MG PO TABS
10.0000 mg | ORAL_TABLET | Freq: Once | ORAL | Status: AC
  Administered 2021-06-18: 10 mg via ORAL
  Filled 2021-06-18: qty 1

## 2021-06-18 MED ORDER — DIPHENHYDRAMINE HCL 25 MG PO CAPS
25.0000 mg | ORAL_CAPSULE | Freq: Once | ORAL | Status: AC
  Administered 2021-06-18: 25 mg via ORAL
  Filled 2021-06-18: qty 1

## 2021-06-18 MED ORDER — ACETAMINOPHEN 325 MG PO TABS
650.0000 mg | ORAL_TABLET | Freq: Once | ORAL | Status: AC
  Administered 2021-06-18: 650 mg via ORAL
  Filled 2021-06-18: qty 2

## 2021-06-18 MED ORDER — SODIUM CHLORIDE 0.9 % IV SOLN
300.0000 mg | Freq: Once | INTRAVENOUS | Status: AC
  Administered 2021-06-18: 300 mg via INTRAVENOUS
  Filled 2021-06-18: qty 300

## 2021-06-18 MED ORDER — SODIUM CHLORIDE 0.9 % IV SOLN: Freq: Once | INTRAVENOUS | Status: AC

## 2021-06-18 MED ORDER — SODIUM CHLORIDE 0.9% IV SOLUTION
250.0000 mL | Freq: Once | INTRAVENOUS | Status: AC
  Administered 2021-06-18: 250 mL via INTRAVENOUS

## 2021-06-18 NOTE — Patient Instructions (Signed)
[facility]  Discharge Instructions: ?Thank you for choosing [facility] to provide your oncology and hematology care.  ?If you have a lab appointment with the [facility], please come in thru the Main Entrance and check in at the main information desk. ? ?Wear comfortable clothing and clothing appropriate for easy access to any Portacath or PICC line.  ? ?We strive to give you quality time with your provider. You may need to reschedule your appointment if you arrive late (15 or more minutes).  Arriving late affects you and other patients whose appointments are after yours.  Also, if you miss three or more appointments without notifying the office, you may be dismissed from the clinic at the provider?s discretion.    ?  ?For prescription refill requests, have your pharmacy contact our office and allow 72 hours for refills to be completed.   ? ?Today you received the following chemotherapy and/or immunotherapy agents blood transfusion    ?  ?To help prevent nausea and vomiting after your treatment, we encourage you to take your nausea medication as directed. ? ?BELOW ARE SYMPTOMS THAT SHOULD BE REPORTED IMMEDIATELY: ?*FEVER GREATER THAN 100.4 F (38 ?C) OR HIGHER ?*CHILLS OR SWEATING ?*NAUSEA AND VOMITING THAT IS NOT CONTROLLED WITH YOUR NAUSEA MEDICATION ?*UNUSUAL SHORTNESS OF BREATH ?*UNUSUAL BRUISING OR BLEEDING ?*URINARY PROBLEMS (pain or burning when urinating, or frequent urination) ?*BOWEL PROBLEMS (unusual diarrhea, constipation, pain near the anus) ?TENDERNESS IN MOUTH AND THROAT WITH OR WITHOUT PRESENCE OF ULCERS (sore throat, sores in mouth, or a toothache) ?UNUSUAL RASH, SWELLING OR PAIN  ?UNUSUAL VAGINAL DISCHARGE OR ITCHING  ? ?Items with * indicate a potential emergency and should be followed up as soon as possible or go to the Emergency Department if any problems should occur. ? ?Please show the CHEMOTHERAPY ALERT CARD or IMMUNOTHERAPY ALERT CARD at check-in to the  Emergency Department and triage nurse. ? ?Should you have questions after your visit or need to cancel or reschedule your appointment, please contact [facility] [phone]  and follow the prompts.  Office hours are 8:00 a.m. to 4:30 p.m. Monday - Friday. Please note that voicemails left after 4:00 p.m. may not be returned until the following business day.  We are closed weekends and major holidays. You have access to a nurse at all times for urgent questions. Please call the main number to the clinic [phone] and follow the prompts. ? ?For any non-urgent questions, you may also contact your provider using MyChart. We now offer e-Visits for anyone 15 and older to request care online for non-urgent symptoms. For details visit [email or web address] ?  ?Also download the MyChart app! Go to the app store, search "MyChart", open the app, select [facility], and log in with your MyChart username and password. ? ?Due to Covid, a mask is required upon entering the hospital/clinic. If you do not have a mask, one will be given to you upon arrival. For doctor visits, patients may have 1 support person aged 75 or older with them. For treatment visits, patients cannot have anyone with them due to current Covid guidelines and our immunocompromised population.  ?

## 2021-06-18 NOTE — Progress Notes (Signed)
Hgb 7.7. Per R. Pennington PA infuse 1 unit of PRBC's today. Blood bank notified, orders released.  ?

## 2021-06-18 NOTE — Progress Notes (Signed)
Patient presents today for Venofer and possible blood transfusion.  Vital signs WNL.  Patient has no new complaints at this time. ? ?Peripheral IV started and blood return noted pre and post infusion. ? ?Venofer infusion and 1UPRBC given today per MD orders.  Stable during infusion without adverse affects.  Vital signs stable.  No complaints at this time.  Discharge from clinic via wheelchair in stable condition.  Alert and oriented X 3.  Follow up with [facility] as scheduled.  ?

## 2021-06-20 LAB — TYPE AND SCREEN
ABO/RH(D): A POS
Antibody Screen: NEGATIVE
Unit division: 0

## 2021-06-20 LAB — BPAM RBC
Blood Product Expiration Date: 202304012359
ISSUE DATE / TIME: 202303241255
Unit Type and Rh: 600

## 2021-06-22 ENCOUNTER — Telehealth: Payer: Self-pay

## 2021-06-22 ENCOUNTER — Other Ambulatory Visit: Payer: Self-pay | Admitting: Family Medicine

## 2021-06-22 DIAGNOSIS — I714 Abdominal aortic aneurysm, without rupture, unspecified: Secondary | ICD-10-CM

## 2021-06-22 NOTE — Progress Notes (Signed)
?  3cm AAA noted on CT ?

## 2021-06-22 NOTE — Telephone Encounter (Signed)
-----   Message from [name], DO sent at 06/16/2021  4:32 PM EDT ----- ?Please inform Savannah Burns that I received a message from [facility] re: back imaging that showed what looks like an aortic aneurysm.  I recommend that she have an aortic ultrasound to further evaluate if she is willing.  Please let me know and if she is agreeable to [facility] or [facility] for imaging. ? ?

## 2021-06-25 ENCOUNTER — Inpatient Hospital Stay (HOSPITAL_COMMUNITY): Payer: Medicare PPO

## 2021-06-25 ENCOUNTER — Other Ambulatory Visit (HOSPITAL_COMMUNITY): Payer: Medicare PPO

## 2021-06-25 VITALS — BP 120/67 | HR 82 | Temp 98.4°F | Resp 20

## 2021-06-25 VITALS — BP 137/56 | HR 83 | Temp 98.7°F | Resp 20

## 2021-06-25 DIAGNOSIS — Z79899 Other long term (current) drug therapy: Secondary | ICD-10-CM | POA: Diagnosis not present

## 2021-06-25 DIAGNOSIS — D500 Iron deficiency anemia secondary to blood loss (chronic): Secondary | ICD-10-CM

## 2021-06-25 DIAGNOSIS — D472 Monoclonal gammopathy: Secondary | ICD-10-CM

## 2021-06-25 LAB — CBC WITH DIFFERENTIAL/PLATELET
Abs Immature Granulocytes: 0.01 10*3/uL (ref 0.00–0.07)
Basophils Absolute: 0 10*3/uL (ref 0.0–0.1)
Basophils Relative: 1 %
Eosinophils Absolute: 0 10*3/uL (ref 0.0–0.5)
Eosinophils Relative: 1 %
HCT: 28.3 % — ABNORMAL LOW (ref 36.0–46.0)
Hemoglobin: 8 g/dL — ABNORMAL LOW (ref 12.0–15.0)
Immature Granulocytes: 0 %
Lymphocytes Relative: 23 %
Lymphs Abs: 0.9 10*3/uL (ref 0.7–4.0)
MCH: 27.7 pg (ref 26.0–34.0)
MCHC: 28.3 g/dL — ABNORMAL LOW (ref 30.0–36.0)
MCV: 97.9 fL (ref 80.0–100.0)
Monocytes Absolute: 0.4 10*3/uL (ref 0.1–1.0)
Monocytes Relative: 11 %
Neutro Abs: 2.6 10*3/uL (ref 1.7–7.7)
Neutrophils Relative %: 64 %
Platelets: 113 10*3/uL — ABNORMAL LOW (ref 150–400)
RBC: 2.89 MIL/uL — ABNORMAL LOW (ref 3.87–5.11)
RDW: 23.1 % — ABNORMAL HIGH (ref 11.5–15.5)
WBC: 3.9 10*3/uL — ABNORMAL LOW (ref 4.0–10.5)
nRBC: 0 % (ref 0.0–0.2)

## 2021-06-25 LAB — COMPREHENSIVE METABOLIC PANEL
ALT: 15 U/L (ref 0–44)
AST: 23 U/L (ref 15–41)
Albumin: 2.9 g/dL — ABNORMAL LOW (ref 3.5–5.0)
Alkaline Phosphatase: 84 U/L (ref 38–126)
Anion gap: 5 (ref 5–15)
BUN: 20 mg/dL (ref 8–23)
CO2: 28 mmol/L (ref 22–32)
Calcium: 8.1 mg/dL — ABNORMAL LOW (ref 8.9–10.3)
Chloride: 103 mmol/L (ref 98–111)
Creatinine, Ser: 0.59 mg/dL (ref 0.44–1.00)
GFR, Estimated: >60 mL/min (ref >60)
Glucose, Bld: 173 mg/dL — ABNORMAL HIGH (ref 70–99)
Potassium: 3.8 mmol/L (ref 3.5–5.1)
Sodium: 136 mmol/L (ref 135–145)
Total Bilirubin: 0.4 mg/dL (ref 0.3–1.2)
Total Protein: 8.2 g/dL — ABNORMAL HIGH (ref 6.5–8.1)

## 2021-06-25 LAB — SAMPLE TO BLOOD BANK

## 2021-06-25 LAB — LACTATE DEHYDROGENASE: LDH: 101 U/L (ref 98–192)

## 2021-06-25 MED ORDER — LORATADINE 10 MG PO TABS
10.0000 mg | ORAL_TABLET | Freq: Once | ORAL | Status: AC
  Administered 2021-06-25: 10 mg via ORAL
  Filled 2021-06-25: qty 1

## 2021-06-25 MED ORDER — SODIUM CHLORIDE 0.9 % IV SOLN
300.0000 mg | Freq: Once | INTRAVENOUS | Status: AC
  Administered 2021-06-25: 300 mg via INTRAVENOUS
  Filled 2021-06-25: qty 300

## 2021-06-25 MED ORDER — SODIUM CHLORIDE 0.9 % IV SOLN: Freq: Once | INTRAVENOUS | Status: AC

## 2021-06-25 MED ORDER — CYANOCOBALAMIN 1000 MCG/ML IJ SOLN
1000.0000 ug | Freq: Once | INTRAMUSCULAR | Status: AC
  Administered 2021-06-25: 1000 ug via INTRAMUSCULAR
  Filled 2021-06-25: qty 1

## 2021-06-25 NOTE — Progress Notes (Signed)
[place] presents today for injection per the provider's orders.  B12 administration without incident; injection site WNL; see MAR for injection details.  Patient tolerated procedure well and without incident.  No questions or complaints noted at this time.  ? ?Discharged from clinic via wheelchair in stable condition. Alert and oriented x 3. F/U with [facility] as scheduled.   ?

## 2021-06-25 NOTE — Patient Instructions (Signed)
[facility]  Discharge Instructions: ?Thank you for choosing [facility] to provide your oncology and hematology care.  ?If you have a lab appointment with the [facility], please come in thru the Main Entrance and check in at the main information desk. ? ?Wear comfortable clothing and clothing appropriate for easy access to any Portacath or PICC line.  ? ?We strive to give you quality time with your provider. You may need to reschedule your appointment if you arrive late (15 or more minutes).  Arriving late affects you and other patients whose appointments are after yours.  Also, if you miss three or more appointments without notifying the office, you may be dismissed from the clinic at the provider?s discretion.    ?  ?For prescription refill requests, have your pharmacy contact our office and allow 72 hours for refills to be completed.   ? ?Today you received the following chemotherapy and/or immunotherapy agents Vitamin B12 ?  ? ?BELOW ARE SYMPTOMS THAT SHOULD BE REPORTED IMMEDIATELY: ?*FEVER GREATER THAN 100.4 F (38 ?C) OR HIGHER ?*CHILLS OR SWEATING ?*NAUSEA AND VOMITING THAT IS NOT CONTROLLED WITH YOUR NAUSEA MEDICATION ?*UNUSUAL SHORTNESS OF BREATH ?*UNUSUAL BRUISING OR BLEEDING ?*URINARY PROBLEMS (pain or burning when urinating, or frequent urination) ?*BOWEL PROBLEMS (unusual diarrhea, constipation, pain near the anus) ?TENDERNESS IN MOUTH AND THROAT WITH OR WITHOUT PRESENCE OF ULCERS (sore throat, sores in mouth, or a toothache) ?UNUSUAL RASH, SWELLING OR PAIN  ?UNUSUAL VAGINAL DISCHARGE OR ITCHING  ? ?Items with * indicate a potential emergency and should be followed up as soon as possible or go to the Emergency Department if any problems should occur. ? ?Please show the CHEMOTHERAPY ALERT CARD or IMMUNOTHERAPY ALERT CARD at check-in to the Emergency Department and triage nurse. ? ?Should you have questions after your visit or need to cancel or reschedule your appointment,  please contact [facility] [phone]  and follow the prompts.  Office hours are 8:00 a.m. to 4:30 p.m. Monday - Friday. Please note that voicemails left after 4:00 p.m. may not be returned until the following business day.  We are closed weekends and major holidays. You have access to a nurse at all times for urgent questions. Please call the main number to the clinic [phone] and follow the prompts. ? ?For any non-urgent questions, you may also contact your provider using MyChart. We now offer e-Visits for anyone 27 and older to request care online for non-urgent symptoms. For details visit [email or web address] ?  ?Also download the MyChart app! Go to the app store, search "MyChart", open the app, select [facility], and log in with your MyChart username and password. ? ?Due to Covid, a mask is required upon entering the hospital/clinic. If you do not have a mask, one will be given to you upon arrival. For doctor visits, patients may have 1 support person aged 29 or older with them. For treatment visits, patients cannot have anyone with them due to current Covid guidelines and our immunocompromised population.  ?

## 2021-06-25 NOTE — Patient Instructions (Signed)
[place]  Discharge Instructions: ?Thank you for choosing [facility] to provide your oncology and hematology care.  ?If you have a lab appointment with the [facility], please come in thru the Main Entrance and check in at the main information desk. ? ?Wear comfortable clothing and clothing appropriate for easy access to any Portacath or PICC line.  ? ?We strive to give you quality time with your provider. You may need to reschedule your appointment if you arrive late (15 or more minutes).  Arriving late affects you and other patients whose appointments are after yours.  Also, if you miss three or more appointments without notifying the office, you may be dismissed from the clinic at the provider?s discretion.    ?  ?For prescription refill requests, have your pharmacy contact our office and allow 72 hours for refills to be completed.   ? ?Today you received Venofer IV iron infusion. ?  ? ? ?BELOW ARE SYMPTOMS THAT SHOULD BE REPORTED IMMEDIATELY: ?*FEVER GREATER THAN 100.4 F (38 ?C) OR HIGHER ?*CHILLS OR SWEATING ?*NAUSEA AND VOMITING THAT IS NOT CONTROLLED WITH YOUR NAUSEA MEDICATION ?*UNUSUAL SHORTNESS OF BREATH ?*UNUSUAL BRUISING OR BLEEDING ?*URINARY PROBLEMS (pain or burning when urinating, or frequent urination) ?*BOWEL PROBLEMS (unusual diarrhea, constipation, pain near the anus) ?TENDERNESS IN MOUTH AND THROAT WITH OR WITHOUT PRESENCE OF ULCERS (sore throat, sores in mouth, or a toothache) ?UNUSUAL RASH, SWELLING OR PAIN  ?UNUSUAL VAGINAL DISCHARGE OR ITCHING  ? ?Items with * indicate a potential emergency and should be followed up as soon as possible or go to the Emergency Department if any problems should occur. ? ?Please show the CHEMOTHERAPY ALERT CARD or IMMUNOTHERAPY ALERT CARD at check-in to the Emergency Department and triage nurse. ? ?Should you have questions after your visit or need to cancel or reschedule your appointment, please contact [facility]  [phone]  and follow the prompts.  Office hours are 8:00 a.m. to 4:30 p.m. Monday - Friday. Please note that voicemails left after 4:00 p.m. may not be returned until the following business day.  We are closed weekends and major holidays. You have access to a nurse at all times for urgent questions. Please call the main number to the clinic [phone] and follow the prompts. ? ?For any non-urgent questions, you may also contact your provider using MyChart. We now offer e-Visits for anyone 63 and older to request care online for non-urgent symptoms. For details visit [email or web address]. ?  ?Also download the MyChart app! Go to the app store, search "MyChart", open the app, select [facility], and log in with your MyChart username and password. ? ?Due to Covid, a mask is required upon entering the hospital/clinic. If you do not have a mask, one will be given to you upon arrival. For doctor visits, patients may have 1 support person aged 2 or older with them. For treatment visits, patients cannot have anyone with them due to current Covid guidelines and our immunocompromised population.  ?

## 2021-06-25 NOTE — Progress Notes (Signed)
Pt presents today for Venofer IV iron infusion per provider's order. Vital signs stable and pt voiced no new complaints at this time.  ? ?Peripheral IV started with good blood return pre and post infusion. ? ?Venofer 300 mg given today per MD orders. Tolerated infusion without adverse affects. Vital signs stable. No complaints at this time. Discharged from clinic via wheelchair in stable condition. Alert and oriented x 3. F/U with [facility] as scheduled.   ?

## 2021-06-28 ENCOUNTER — Ambulatory Visit (HOSPITAL_COMMUNITY): Payer: Medicare PPO

## 2021-06-28 ENCOUNTER — Other Ambulatory Visit (HOSPITAL_COMMUNITY): Payer: Medicare PPO

## 2021-06-28 LAB — PROTEIN ELECTROPHORESIS, SERUM
A/G Ratio: 0.6 — ABNORMAL LOW (ref 0.7–1.7)
Albumin ELP: 3 g/dL (ref 2.9–4.4)
Alpha-1-Globulin: 0.3 g/dL (ref 0.0–0.4)
Alpha-2-Globulin: 0.7 g/dL (ref 0.4–1.0)
Beta Globulin: 1 g/dL (ref 0.7–1.3)
Gamma Globulin: 2.9 g/dL — ABNORMAL HIGH (ref 0.4–1.8)
Globulin, Total: 4.9 g/dL — ABNORMAL HIGH (ref 2.2–3.9)
M-Spike, %: 2.7 g/dL — ABNORMAL HIGH
Total Protein ELP: 7.9 g/dL (ref 6.0–8.5)

## 2021-06-28 LAB — KAPPA/LAMBDA LIGHT CHAINS
Kappa free light chain: 102.6 mg/L — ABNORMAL HIGH (ref 3.3–19.4)
Kappa, lambda light chain ratio: 6.89 — ABNORMAL HIGH (ref 0.26–1.65)
Lambda free light chains: 14.9 mg/L (ref 5.7–26.3)

## 2021-06-30 DIAGNOSIS — R030 Elevated blood-pressure reading, without diagnosis of hypertension: Secondary | ICD-10-CM | POA: Diagnosis not present

## 2021-06-30 DIAGNOSIS — G894 Chronic pain syndrome: Secondary | ICD-10-CM | POA: Diagnosis not present

## 2021-06-30 DIAGNOSIS — E119 Type 2 diabetes mellitus without complications: Secondary | ICD-10-CM | POA: Diagnosis not present

## 2021-06-30 DIAGNOSIS — Z79899 Other long term (current) drug therapy: Secondary | ICD-10-CM | POA: Diagnosis not present

## 2021-06-30 DIAGNOSIS — Z6841 Body Mass Index (BMI) 40.0 and over, adult: Secondary | ICD-10-CM | POA: Diagnosis not present

## 2021-06-30 DIAGNOSIS — M5136 Other intervertebral disc degeneration, lumbar region: Secondary | ICD-10-CM | POA: Diagnosis not present

## 2021-07-01 ENCOUNTER — Inpatient Hospital Stay (HOSPITAL_COMMUNITY): Payer: Medicare PPO | Attending: Hematology

## 2021-07-01 ENCOUNTER — Encounter (HOSPITAL_COMMUNITY): Payer: Self-pay

## 2021-07-01 ENCOUNTER — Inpatient Hospital Stay (HOSPITAL_COMMUNITY): Payer: Medicare PPO

## 2021-07-01 VITALS — BP 140/51 | HR 83 | Temp 98.9°F | Resp 18

## 2021-07-01 DIAGNOSIS — D500 Iron deficiency anemia secondary to blood loss (chronic): Secondary | ICD-10-CM | POA: Diagnosis not present

## 2021-07-01 DIAGNOSIS — D649 Anemia, unspecified: Secondary | ICD-10-CM

## 2021-07-01 LAB — PREPARE RBC (CROSSMATCH)

## 2021-07-01 LAB — CBC
HCT: 23.8 % — ABNORMAL LOW (ref 36.0–46.0)
Hemoglobin: 6.7 g/dL — CL (ref 12.0–15.0)
MCH: 28.3 pg (ref 26.0–34.0)
MCHC: 28.2 g/dL — ABNORMAL LOW (ref 30.0–36.0)
MCV: 100.4 fL — ABNORMAL HIGH (ref 80.0–100.0)
Platelets: 107 10*3/uL — ABNORMAL LOW (ref 150–400)
RBC: 2.37 MIL/uL — ABNORMAL LOW (ref 3.87–5.11)
RDW: 24.5 % — ABNORMAL HIGH (ref 11.5–15.5)
WBC: 5 10*3/uL (ref 4.0–10.5)
nRBC: 0.4 % — ABNORMAL HIGH (ref 0.0–0.2)

## 2021-07-01 MED ORDER — DIPHENHYDRAMINE HCL 25 MG PO CAPS
25.0000 mg | ORAL_CAPSULE | Freq: Once | ORAL | Status: AC
  Administered 2021-07-01: 25 mg via ORAL
  Filled 2021-07-01: qty 1

## 2021-07-01 MED ORDER — SODIUM CHLORIDE 0.9% IV SOLUTION
250.0000 mL | Freq: Once | INTRAVENOUS | Status: AC
  Administered 2021-07-01: 250 mL via INTRAVENOUS

## 2021-07-01 MED ORDER — ACETAMINOPHEN 325 MG PO TABS
650.0000 mg | ORAL_TABLET | Freq: Once | ORAL | Status: AC
  Administered 2021-07-01: 650 mg via ORAL
  Filled 2021-07-01: qty 2

## 2021-07-01 NOTE — Progress Notes (Signed)
Patient presents today for 2 units of PRBC's. Vitals signs stable. HGB 6.7 . Patient symptomatic. MAR reviewed and updated.   ? ?2 units of PRBC's given today per MD orders. Tolerated infusion without adverse affects. Vital signs stable. No complaints at this time. Discharged from clinic ambulatory in stable condition. Alert and oriented x 3. F/U with [facility] as scheduled.   ?

## 2021-07-01 NOTE — Patient Instructions (Signed)
[facility]  Discharge Instructions: ?Thank you for choosing [facility] to provide your oncology and hematology care.  ?If you have a lab appointment with the [facility], please come in thru the Main Entrance and check in at the main information desk. ? ?Wear comfortable clothing and clothing appropriate for easy access to any Portacath or PICC line.  ? ?We strive to give you quality time with your provider. You may need to reschedule your appointment if you arrive late (15 or more minutes).  Arriving late affects you and other patients whose appointments are after yours.  Also, if you miss three or more appointments without notifying the office, you may be dismissed from the clinic at the provider?s discretion.    ?  ?For prescription refill requests, have your pharmacy contact our office and allow 72 hours for refills to be completed.   ? ?Today you received the following : 2 units of PRBC's.     ?  ?To help prevent nausea and vomiting after your treatment, we encourage you to take your nausea medication as directed. ? ?BELOW ARE SYMPTOMS THAT SHOULD BE REPORTED IMMEDIATELY: ?*FEVER GREATER THAN 100.4 F (38 ?C) OR HIGHER ?*CHILLS OR SWEATING ?*NAUSEA AND VOMITING THAT IS NOT CONTROLLED WITH YOUR NAUSEA MEDICATION ?*UNUSUAL SHORTNESS OF BREATH ?*UNUSUAL BRUISING OR BLEEDING ?*URINARY PROBLEMS (pain or burning when urinating, or frequent urination) ?*BOWEL PROBLEMS (unusual diarrhea, constipation, pain near the anus) ?TENDERNESS IN MOUTH AND THROAT WITH OR WITHOUT PRESENCE OF ULCERS (sore throat, sores in mouth, or a toothache) ?UNUSUAL RASH, SWELLING OR PAIN  ?UNUSUAL VAGINAL DISCHARGE OR ITCHING  ? ?Items with * indicate a potential emergency and should be followed up as soon as possible or go to the Emergency Department if any problems should occur. ? ?Please show the CHEMOTHERAPY ALERT CARD or IMMUNOTHERAPY ALERT CARD at check-in to the Emergency Department and triage  nurse. ? ?Should you have questions after your visit or need to cancel or reschedule your appointment, please contact [facility] [phone]  and follow the prompts.  Office hours are 8:00 a.m. to 4:30 p.m. Monday - Friday. Please note that voicemails left after 4:00 p.m. may not be returned until the following business day.  We are closed weekends and major holidays. You have access to a nurse at all times for urgent questions. Please call the main number to the clinic [phone] and follow the prompts. ? ?For any non-urgent questions, you may also contact your provider using MyChart. We now offer e-Visits for anyone 108 and older to request care online for non-urgent symptoms. For details visit [email or web address]. ?  ?Also download the MyChart app! Go to the app store, search "MyChart", open the app, select [facility], and log in with your MyChart username and password. ? ?Due to Covid, a mask is required upon entering the hospital/clinic. If you do not have a mask, one will be given to you upon arrival. For doctor visits, patients may have 1 support person aged 34 or older with them. For treatment visits, patients cannot have anyone with them due to current Covid guidelines and our immunocompromised population.  ?

## 2021-07-02 ENCOUNTER — Ambulatory Visit (HOSPITAL_COMMUNITY): Admission: RE | Admit: 2021-07-02 | Payer: Medicare PPO | Source: Ambulatory Visit

## 2021-07-02 LAB — TYPE AND SCREEN
ABO/RH(D): A POS
Antibody Screen: NEGATIVE
Unit division: 0
Unit division: 0

## 2021-07-02 LAB — BPAM RBC
Blood Product Expiration Date: 202304252359
Blood Product Expiration Date: 202305052359
ISSUE DATE / TIME: 202304061023
ISSUE DATE / TIME: 202304061204
Unit Type and Rh: 6200
Unit Type and Rh: 6200

## 2021-07-02 NOTE — Progress Notes (Deleted)
NO SHOW

## 2021-07-05 ENCOUNTER — Inpatient Hospital Stay (HOSPITAL_COMMUNITY): Payer: Medicare PPO | Admitting: Physician Assistant

## 2021-07-06 ENCOUNTER — Encounter (HOSPITAL_COMMUNITY): Payer: Self-pay | Admitting: Physician Assistant

## 2021-07-06 ENCOUNTER — Other Ambulatory Visit (HOSPITAL_COMMUNITY): Payer: Self-pay | Admitting: Physician Assistant

## 2021-07-06 ENCOUNTER — Telehealth (HOSPITAL_COMMUNITY): Payer: Self-pay | Admitting: *Deleted

## 2021-07-06 ENCOUNTER — Other Ambulatory Visit (HOSPITAL_COMMUNITY): Payer: Self-pay | Admitting: *Deleted

## 2021-07-06 DIAGNOSIS — D500 Iron deficiency anemia secondary to blood loss (chronic): Secondary | ICD-10-CM

## 2021-07-06 NOTE — Telephone Encounter (Signed)
Made outgoing call to patient to follow up on symptoms and see how she was feeling.  States she is ok.  Advised her that she missed her Aortic US last Friday and she stated that she thought it was this coming Friday.  She will call [facility] to reschedule.  As for follow up labs, she did not have an appointment.  Only availability she had was on Friday.  Appointment made for CBC and BB sample. ?

## 2021-07-09 ENCOUNTER — Inpatient Hospital Stay (HOSPITAL_BASED_OUTPATIENT_CLINIC_OR_DEPARTMENT_OTHER): Payer: Medicare PPO

## 2021-07-09 ENCOUNTER — Encounter (HOSPITAL_COMMUNITY): Payer: Self-pay

## 2021-07-09 ENCOUNTER — Inpatient Hospital Stay (HOSPITAL_COMMUNITY): Payer: Medicare PPO

## 2021-07-09 VITALS — BP 124/62 | HR 79 | Temp 98.5°F | Resp 18

## 2021-07-09 DIAGNOSIS — D500 Iron deficiency anemia secondary to blood loss (chronic): Secondary | ICD-10-CM

## 2021-07-09 DIAGNOSIS — D649 Anemia, unspecified: Secondary | ICD-10-CM

## 2021-07-09 LAB — CBC
HCT: 23 % — ABNORMAL LOW (ref 36.0–46.0)
Hemoglobin: 6.5 g/dL — CL (ref 12.0–15.0)
MCH: 28.6 pg (ref 26.0–34.0)
MCHC: 28.3 g/dL — ABNORMAL LOW (ref 30.0–36.0)
MCV: 101.3 fL — ABNORMAL HIGH (ref 80.0–100.0)
Platelets: 106 10*3/uL — ABNORMAL LOW (ref 150–400)
RBC: 2.27 MIL/uL — ABNORMAL LOW (ref 3.87–5.11)
RDW: 20.9 % — ABNORMAL HIGH (ref 11.5–15.5)
WBC: 4.3 10*3/uL (ref 4.0–10.5)
nRBC: 0 % (ref 0.0–0.2)

## 2021-07-09 LAB — PREPARE RBC (CROSSMATCH)

## 2021-07-09 LAB — SAMPLE TO BLOOD BANK

## 2021-07-09 MED ORDER — SODIUM CHLORIDE 0.9% IV SOLUTION
250.0000 mL | Freq: Once | INTRAVENOUS | Status: AC
  Administered 2021-07-09: 250 mL via INTRAVENOUS

## 2021-07-09 MED ORDER — ACETAMINOPHEN 325 MG PO TABS
650.0000 mg | ORAL_TABLET | Freq: Once | ORAL | Status: AC
  Administered 2021-07-09: 650 mg via ORAL
  Filled 2021-07-09: qty 2

## 2021-07-09 MED ORDER — DIPHENHYDRAMINE HCL 25 MG PO CAPS
25.0000 mg | ORAL_CAPSULE | Freq: Once | ORAL | Status: AC
  Administered 2021-07-09: 25 mg via ORAL
  Filled 2021-07-09: qty 1

## 2021-07-09 NOTE — Progress Notes (Signed)
Patient tolerated transfusion with no complaints voiced.  Side effects with management reviewed with understanding verbalized.  Port site clean and dry with no bruising or swelling noted at site.  Good blood return noted before and after administration.  Band aid applied.  Patient left in satisfactory condition with VSS and no s/s of distress noted.   ?

## 2021-07-09 NOTE — Progress Notes (Signed)
CRITICAL VALUE ALERT ?Critical value received:  hgb 6.5 ?Date of notification:  07-09-21 ?Time of notification: 9906 ?Critical value read back:  Yes.   ?Nurse who received alert: C. Ciel Chervenak RN ?MD notified time and response:  0948 , will give 2 units per Tarri Abernethy PA-C.  ?

## 2021-07-09 NOTE — Progress Notes (Signed)
I had a face-to-face conversation with the patient during her blood transfusions today, to discuss her worsening anemia and other symptoms. ? ?CBC last week showed Hgb 6.7 and she received 2 units PRBC on Thursday, 07/01/2021. ?CBC today (07/09/2021) showed Hgb 6.5.  She was transfused 2 units PRBC today.  She is hemodynamically stable. ? ?Patient admits to having black and tarry bowel movements for the past week. ?We discussed that this is extremely concerning for acute GI bleeding and I recommended direct admission to our hospital for further monitoring of blood counts and urgent GI assessment.  However, patient continues to refuse hospitalization at this facility.  She has an appointment to see GI at [facility] on 07/21/2021, and she is worried that hospitalization here would jeopardize her ability to see gastroenterologist at [facility].  We discussed that although I believe it would be safest for her to be hospitalized here, I respect her decision and cannot force her to be hospitalized against her will.  We did discuss the possibility that she may choose to drive to another facility to seek urgent medical care there, as she is refusing direct admission or emergency department evaluation here at [facility]. ? ?She also informed me that she has been having intermittent symptoms concerning for expressive aphasia, and has noticed that she will intend to say one word, but another word will come out of her mouth.  This is happened a few times in the past month.  She denies any other focal neurologic deficits.  I discussed with her that this is concerning for neurologic changes that may be related to low blood flow to her brain, TIA, stroke, or other neurologic abnormality.  I again advised for urgent medical evaluation at our facility, which she refused.  I have urged her to seek medical attention at another facility and to also discuss urgently with her PCP. ? ?Tarri Abernethy PA-C ?07/09/2021 2:28 PM ?

## 2021-07-09 NOTE — Patient Instructions (Signed)
[facility]  Discharge Instructions: Thank you for choosing [facility] to provide your oncology and hematology care.  If you have a lab appointment with the Cancer Center, please come in thru the Main Entrance and check in at the main information desk.  Wear comfortable clothing and clothing appropriate for easy access to any Portacath or PICC line.   We strive to give you quality time with your provider. You may need to reschedule your appointment if you arrive late (15 or more minutes).  Arriving late affects you and other patients whose appointments are after yours.  Also, if you miss three or more appointments without notifying the office, you may be dismissed from the clinic at the provider's discretion.      For prescription refill requests, have your pharmacy contact our office and allow 72 hours for refills to be completed.        To help prevent nausea and vomiting after your treatment, we encourage you to take your nausea medication as directed.  BELOW ARE SYMPTOMS THAT SHOULD BE REPORTED IMMEDIATELY: *FEVER GREATER THAN 100.4 F (38 C) OR HIGHER *CHILLS OR SWEATING *NAUSEA AND VOMITING THAT IS NOT CONTROLLED WITH YOUR NAUSEA MEDICATION *UNUSUAL SHORTNESS OF BREATH *UNUSUAL BRUISING OR BLEEDING *URINARY PROBLEMS (pain or burning when urinating, or frequent urination) *BOWEL PROBLEMS (unusual diarrhea, constipation, pain near the anus) TENDERNESS IN MOUTH AND THROAT WITH OR WITHOUT PRESENCE OF ULCERS (sore throat, sores in mouth, or a toothache) UNUSUAL RASH, SWELLING OR PAIN  UNUSUAL VAGINAL DISCHARGE OR ITCHING   Items with * indicate a potential emergency and should be followed up as soon as possible or go to the Emergency Department if any problems should occur.  Please show the CHEMOTHERAPY ALERT CARD or IMMUNOTHERAPY ALERT CARD at check-in to the Emergency Department and triage nurse.  Should you have questions after your visit or need to cancel  or reschedule your appointment, please contact [facility] [phone]  and follow the prompts.  Office hours are 8:00 a.m. to 4:30 p.m. Monday - Friday. Please note that voicemails left after 4:00 p.m. may not be returned until the following business day.  We are closed weekends and major holidays. You have access to a nurse at all times for urgent questions. Please call the main number to the clinic [phone] and follow the prompts.  For any non-urgent questions, you may also contact your provider using MyChart. We now offer e-Visits for anyone 18 and older to request care online for non-urgent symptoms. For details visit [email or web address].   Also download the MyChart app! Go to the app store, search "MyChart", open the app, select [facility], and log in with your MyChart username and password.  Due to Covid, a mask is required upon entering the hospital/clinic. If you do not have a mask, one will be given to you upon arrival. For doctor visits, patients may have 1 support person aged 18 or older with them. For treatment visits, patients cannot have anyone with them due to current Covid guidelines and our immunocompromised population.  

## 2021-07-11 LAB — BPAM RBC
Blood Product Expiration Date: 202304222359
Blood Product Expiration Date: 202304222359
ISSUE DATE / TIME: 202304141030
ISSUE DATE / TIME: 202304141217
Unit Type and Rh: 6200
Unit Type and Rh: 6200

## 2021-07-11 LAB — TYPE AND SCREEN
ABO/RH(D): A POS
Antibody Screen: NEGATIVE
Unit division: 0
Unit division: 0

## 2021-07-16 ENCOUNTER — Encounter (HOSPITAL_COMMUNITY): Payer: Self-pay

## 2021-07-16 ENCOUNTER — Inpatient Hospital Stay (HOSPITAL_COMMUNITY): Payer: Medicare PPO

## 2021-07-16 ENCOUNTER — Ambulatory Visit (HOSPITAL_COMMUNITY)
Admission: RE | Admit: 2021-07-16 | Discharge: 2021-07-16 | Disposition: A | Discharge status: Home / Self Care | Payer: Medicare PPO | Source: Ambulatory Visit | Attending: Physician Assistant | Admitting: Physician Assistant

## 2021-07-16 VITALS — BP 131/46 | HR 86 | Temp 98.1°F | Resp 19

## 2021-07-16 DIAGNOSIS — D500 Iron deficiency anemia secondary to blood loss (chronic): Secondary | ICD-10-CM | POA: Diagnosis not present

## 2021-07-16 DIAGNOSIS — D472 Monoclonal gammopathy: Secondary | ICD-10-CM | POA: Insufficient documentation

## 2021-07-16 DIAGNOSIS — Z8579 Personal history of other malignant neoplasms of lymphoid, hematopoietic and related tissues: Secondary | ICD-10-CM | POA: Diagnosis not present

## 2021-07-16 LAB — CBC WITH DIFFERENTIAL/PLATELET
Abs Immature Granulocytes: 0.02 10*3/uL (ref 0.00–0.07)
Basophils Absolute: 0 10*3/uL (ref 0.0–0.1)
Basophils Relative: 0 %
Eosinophils Absolute: 0 10*3/uL (ref 0.0–0.5)
Eosinophils Relative: 1 %
HCT: 24.2 % — ABNORMAL LOW (ref 36.0–46.0)
Hemoglobin: 6.9 g/dL — CL (ref 12.0–15.0)
Immature Granulocytes: 0 %
Lymphocytes Relative: 23 %
Lymphs Abs: 1.5 10*3/uL (ref 0.7–4.0)
MCH: 27.7 pg (ref 26.0–34.0)
MCHC: 28.5 g/dL — ABNORMAL LOW (ref 30.0–36.0)
MCV: 97.2 fL (ref 80.0–100.0)
Monocytes Absolute: 0.7 10*3/uL (ref 0.1–1.0)
Monocytes Relative: 10 %
Neutro Abs: 4.2 10*3/uL (ref 1.7–7.7)
Neutrophils Relative %: 66 %
Platelets: 152 10*3/uL (ref 150–400)
RBC: 2.49 MIL/uL — ABNORMAL LOW (ref 3.87–5.11)
RDW: 17.8 % — ABNORMAL HIGH (ref 11.5–15.5)
WBC Morphology: REACTIVE
WBC: 6.5 10*3/uL (ref 4.0–10.5)
nRBC: 0 % (ref 0.0–0.2)

## 2021-07-16 LAB — PREPARE RBC (CROSSMATCH)

## 2021-07-16 LAB — SAMPLE TO BLOOD BANK

## 2021-07-16 MED ORDER — DIPHENHYDRAMINE HCL 25 MG PO CAPS
25.0000 mg | ORAL_CAPSULE | Freq: Once | ORAL | Status: AC
  Administered 2021-07-16: 25 mg via ORAL
  Filled 2021-07-16: qty 1

## 2021-07-16 MED ORDER — ACETAMINOPHEN 325 MG PO TABS
650.0000 mg | ORAL_TABLET | Freq: Once | ORAL | Status: AC
  Administered 2021-07-16: 650 mg via ORAL
  Filled 2021-07-16: qty 2

## 2021-07-16 MED ORDER — SODIUM CHLORIDE 0.9% IV SOLUTION
250.0000 mL | Freq: Once | INTRAVENOUS | Status: AC
  Administered 2021-07-16: 250 mL via INTRAVENOUS

## 2021-07-16 NOTE — Patient Instructions (Signed)
[facility]  Discharge Instructions: ?Thank you for choosing [facility] to provide your oncology and hematology care.  ?If you have a lab appointment with the [facility], please come in thru the Main Entrance and check in at the main information desk. ? ?Wear comfortable clothing and clothing appropriate for easy access to any Portacath or PICC line.  ? ?We strive to give you quality time with your provider. You may need to reschedule your appointment if you arrive late (15 or more minutes).  Arriving late affects you and other patients whose appointments are after yours.  Also, if you miss three or more appointments without notifying the office, you may be dismissed from the clinic at the provider?s discretion.    ?  ?For prescription refill requests, have your pharmacy contact our office and allow 72 hours for refills to be completed.   ? ?Today you received the following chemotherapy and/or immunotherapy agents 2 units of PRBC's.     ?  ?To help prevent nausea and vomiting after your treatment, we encourage you to take your nausea medication as directed. ? ?BELOW ARE SYMPTOMS THAT SHOULD BE REPORTED IMMEDIATELY: ?*FEVER GREATER THAN 100.4 F (38 ?C) OR HIGHER ?*CHILLS OR SWEATING ?*NAUSEA AND VOMITING THAT IS NOT CONTROLLED WITH YOUR NAUSEA MEDICATION ?*UNUSUAL SHORTNESS OF BREATH ?*UNUSUAL BRUISING OR BLEEDING ?*URINARY PROBLEMS (pain or burning when urinating, or frequent urination) ?*BOWEL PROBLEMS (unusual diarrhea, constipation, pain near the anus) ?TENDERNESS IN MOUTH AND THROAT WITH OR WITHOUT PRESENCE OF ULCERS (sore throat, sores in mouth, or a toothache) ?UNUSUAL RASH, SWELLING OR PAIN  ?UNUSUAL VAGINAL DISCHARGE OR ITCHING  ? ?Items with * indicate a potential emergency and should be followed up as soon as possible or go to the Emergency Department if any problems should occur. ? ?Please show the CHEMOTHERAPY ALERT CARD or IMMUNOTHERAPY ALERT CARD at check-in to the  Emergency Department and triage nurse. ? ?Should you have questions after your visit or need to cancel or reschedule your appointment, please contact [facility] [phone]  and follow the prompts.  Office hours are 8:00 a.m. to 4:30 p.m. Monday - Friday. Please note that voicemails left after 4:00 p.m. may not be returned until the following business day.  We are closed weekends and major holidays. You have access to a nurse at all times for urgent questions. Please call the main number to the clinic [phone] and follow the prompts. ? ?For any non-urgent questions, you may also contact your provider using MyChart. We now offer e-Visits for anyone 97 and older to request care online for non-urgent symptoms. For details visit [email or web address]. ?  ?Also download the MyChart app! Go to the app store, search "MyChart", open the app, select [facility], and log in with your MyChart username and password. ? ?Due to Covid, a mask is required upon entering the hospital/clinic. If you do not have a mask, one will be given to you upon arrival. For doctor visits, patients may have 1 support person aged 54 or older with them. For treatment visits, patients cannot have anyone with them due to current Covid guidelines and our immunocompromised population.  ?

## 2021-07-16 NOTE — Progress Notes (Signed)
Patient presents today for possible blood products . Labs pending. Vital signs stable. Patient has complaints of a cough that patient states is better. Patient did not go by Xray and declined the xray at this time.  ? ?2 units of blood products given today per MD orders. Tolerated infusion without adverse affects. Vital signs stable. No complaints at this time. Discharged from clinic by wheel chair and accompanied by granddaughter in stable condition. Alert and oriented x 3. F/U with [facility] as scheduled.   ?

## 2021-07-16 NOTE — Progress Notes (Signed)
CRITICAL VALUE ALERT ?Critical value received:  Hgb 6.9 ?Date of notification:  07-16-21 ?Time of notification: 0920 ?Critical value read back:  Yes.   ?Nurse who received alert:  C. Thomasene Dubow RN ?MD notified time and response:  0924 will give 2 units today per PA.  ?  ?

## 2021-07-18 LAB — BPAM RBC
Blood Product Expiration Date: 202305202359
Blood Product Expiration Date: 202305202359
ISSUE DATE / TIME: 202304211031
ISSUE DATE / TIME: 202304211151
Unit Type and Rh: 6200
Unit Type and Rh: 6200

## 2021-07-18 LAB — TYPE AND SCREEN
ABO/RH(D): A POS
Antibody Screen: NEGATIVE
Unit division: 0
Unit division: 0

## 2021-07-20 ENCOUNTER — Ambulatory Visit (HOSPITAL_COMMUNITY)
Admission: RE | Admit: 2021-07-20 | Discharge: 2021-07-20 | Disposition: A | Discharge status: Home / Self Care | Payer: Medicare PPO | Source: Ambulatory Visit | Attending: Family Medicine | Admitting: Family Medicine

## 2021-07-20 DIAGNOSIS — I714 Abdominal aortic aneurysm, without rupture, unspecified: Secondary | ICD-10-CM | POA: Diagnosis not present

## 2021-07-20 DIAGNOSIS — Z0389 Encounter for observation for other suspected diseases and conditions ruled out: Secondary | ICD-10-CM | POA: Diagnosis not present

## 2021-07-21 DIAGNOSIS — D500 Iron deficiency anemia secondary to blood loss (chronic): Secondary | ICD-10-CM | POA: Diagnosis not present

## 2021-07-23 ENCOUNTER — Inpatient Hospital Stay (HOSPITAL_COMMUNITY): Payer: Medicare PPO

## 2021-07-23 VITALS — BP 138/70 | HR 98 | Temp 99.0°F | Resp 20

## 2021-07-23 DIAGNOSIS — D500 Iron deficiency anemia secondary to blood loss (chronic): Secondary | ICD-10-CM

## 2021-07-23 DIAGNOSIS — D649 Anemia, unspecified: Secondary | ICD-10-CM

## 2021-07-23 LAB — CBC
HCT: 24.9 % — ABNORMAL LOW (ref 36.0–46.0)
Hemoglobin: 7.1 g/dL — ABNORMAL LOW (ref 12.0–15.0)
MCH: 27.2 pg (ref 26.0–34.0)
MCHC: 28.5 g/dL — ABNORMAL LOW (ref 30.0–36.0)
MCV: 95.4 fL (ref 80.0–100.0)
Platelets: 134 10*3/uL — ABNORMAL LOW (ref 150–400)
RBC: 2.61 MIL/uL — ABNORMAL LOW (ref 3.87–5.11)
RDW: 17.8 % — ABNORMAL HIGH (ref 11.5–15.5)
WBC: 4.6 10*3/uL (ref 4.0–10.5)
nRBC: 0 % (ref 0.0–0.2)

## 2021-07-23 LAB — SAMPLE TO BLOOD BANK

## 2021-07-23 LAB — PREPARE RBC (CROSSMATCH)

## 2021-07-23 MED ORDER — SODIUM CHLORIDE 0.9% IV SOLUTION
250.0000 mL | Freq: Once | INTRAVENOUS | Status: AC
  Administered 2021-07-23: 250 mL via INTRAVENOUS

## 2021-07-23 MED ORDER — DIPHENHYDRAMINE HCL 25 MG PO CAPS
25.0000 mg | ORAL_CAPSULE | Freq: Once | ORAL | Status: AC
  Administered 2021-07-23: 25 mg via ORAL
  Filled 2021-07-23: qty 1

## 2021-07-23 MED ORDER — ACETAMINOPHEN 325 MG PO TABS
650.0000 mg | ORAL_TABLET | Freq: Once | ORAL | Status: AC
  Administered 2021-07-23: 650 mg via ORAL
  Filled 2021-07-23: qty 2

## 2021-07-23 NOTE — Patient Instructions (Signed)
[facility]  Discharge Instructions: ?Thank you for choosing [facility] to provide your oncology and hematology care.  ?If you have a lab appointment with the [facility], please come in thru the Main Entrance and check in at the main information desk. ? ?Wear comfortable clothing and clothing appropriate for easy access to any Portacath or PICC line.  ? ?We strive to give you quality time with your provider. You may need to reschedule your appointment if you arrive late (15 or more minutes).  Arriving late affects you and other patients whose appointments are after yours.  Also, if you miss three or more appointments without notifying the office, you may be dismissed from the clinic at the provider?s discretion.    ?  ?For prescription refill requests, have your pharmacy contact our office and allow 72 hours for refills to be completed.   ? ?Today you received the following chemotherapy and/or immunotherapy agents 1UPRBC    ?  ?To help prevent nausea and vomiting after your treatment, we encourage you to take your nausea medication as directed. ? ?BELOW ARE SYMPTOMS THAT SHOULD BE REPORTED IMMEDIATELY: ?*FEVER GREATER THAN 100.4 F (38 ?C) OR HIGHER ?*CHILLS OR SWEATING ?*NAUSEA AND VOMITING THAT IS NOT CONTROLLED WITH YOUR NAUSEA MEDICATION ?*UNUSUAL SHORTNESS OF BREATH ?*UNUSUAL BRUISING OR BLEEDING ?*URINARY PROBLEMS (pain or burning when urinating, or frequent urination) ?*BOWEL PROBLEMS (unusual diarrhea, constipation, pain near the anus) ?TENDERNESS IN MOUTH AND THROAT WITH OR WITHOUT PRESENCE OF ULCERS (sore throat, sores in mouth, or a toothache) ?UNUSUAL RASH, SWELLING OR PAIN  ?UNUSUAL VAGINAL DISCHARGE OR ITCHING  ? ?Items with * indicate a potential emergency and should be followed up as soon as possible or go to the Emergency Department if any problems should occur. ? ?Please show the CHEMOTHERAPY ALERT CARD or IMMUNOTHERAPY ALERT CARD at check-in to the Emergency  Department and triage nurse. ? ?Should you have questions after your visit or need to cancel or reschedule your appointment, please contact [facility] [phone]  and follow the prompts.  Office hours are 8:00 a.m. to 4:30 p.m. Monday - Friday. Please note that voicemails left after 4:00 p.m. may not be returned until the following business day.  We are closed weekends and major holidays. You have access to a nurse at all times for urgent questions. Please call the main number to the clinic [phone] and follow the prompts. ? ?For any non-urgent questions, you may also contact your provider using MyChart. We now offer e-Visits for anyone 72 and older to request care online for non-urgent symptoms. For details visit [email or web address]. ?  ?Also download the MyChart app! Go to the app store, search "MyChart", open the app, select [facility], and log in with your MyChart username and password. ? ?Due to Covid, a mask is required upon entering the hospital/clinic. If you do not have a mask, one will be given to you upon arrival. For doctor visits, patients may have 1 support person aged 72 or older with them. For treatment visits, patients cannot have anyone with them due to current Covid guidelines and our immunocompromised population.  ?

## 2021-07-23 NOTE — Progress Notes (Signed)
Patient presents today for 1 UPRBC per providers order.  Vital signs WNL.  Patient has no new complaints at this time. ? ?Peripheral IV started and blood return noted pre and post infusion. ? ?1UPRBC given today per MD orders.  Stable during infusion without adverse affects.  Vital signs stable.  No complaints at this time.  Discharge from clinic  via wheelchair oriented X 3.  Follow up with [facility] as scheduled.  ?

## 2021-07-26 ENCOUNTER — Encounter (HOSPITAL_COMMUNITY): Payer: Self-pay | Admitting: Hematology

## 2021-07-26 LAB — BPAM RBC
Blood Product Expiration Date: 202305232359
ISSUE DATE / TIME: 202304281014
Unit Type and Rh: 6200

## 2021-07-26 LAB — TYPE AND SCREEN
ABO/RH(D): A POS
Antibody Screen: NEGATIVE
Unit division: 0

## 2021-07-27 ENCOUNTER — Ambulatory Visit (INDEPENDENT_AMBULATORY_CARE_PROVIDER_SITE_OTHER): Payer: Medicare PPO

## 2021-07-27 VITALS — Wt 299.0 lb

## 2021-07-27 DIAGNOSIS — Z0000 Encounter for general adult medical examination without abnormal findings: Secondary | ICD-10-CM

## 2021-07-27 NOTE — Patient Instructions (Signed)
Savannah Burns , ?Thank you for taking time to come for your Medicare Wellness Visit. I appreciate your ongoing commitment to your health goals. Please review the following plan we discussed and let me know if I can assist you in the future.  ? ?Screening recommendations/referrals: ?Colonoscopy: Done 04/07/2020 - appointment with [facility] this summer ?Mammogram: recommended every year ?Bone Density: recommended every 2 years ?Recommended yearly ophthalmology/optometry visit for glaucoma screening and checkup ?Recommended yearly dental visit for hygiene and checkup ? ?Vaccinations: ?Influenza vaccine: Done 12/09/2020 - Repeat annually ?Pneumococcal vaccine: Done 03/01/2014 & 09/08/2015 ?Tdap vaccine: Done 09/04/2020 - Repeat in 10 years ?Shingles vaccine: Zostavax done 2014 - Shingrix is 2 doses 2-6 months apart and over 90% effective     ?Covid-19:Done 06/22/2019 - declines any boosters ? ?Advanced directives: Please bring a copy of your health care power of attorney and living will to the office to be added to your chart at your convenience.  ? ?Conditions/risks identified: Aim for 30 minutes of exercise - stretching, seated exercises, etc, 6-8 glasses of water, and 5 servings of fruits and vegetables each day.  ? ?Next appointment: Follow up in one year for your annual wellness visit  ? ? ?Preventive Care 80 Years and Older, Female ?Preventive care refers to lifestyle choices and visits with your health care provider that can promote health and wellness. ?What does preventive care include? ?A yearly physical exam. This is also called an annual well check. ?Dental exams once or twice a year. ?Routine eye exams. Ask your health care provider how often you should have your eyes checked. ?Personal lifestyle choices, including: ?Daily care of your teeth and gums. ?Regular physical activity. ?Eating a healthy diet. ?Avoiding tobacco and drug use. ?Limiting alcohol use. ?Practicing safe sex. ?Taking low-dose aspirin every  day. ?Taking vitamin and mineral supplements as recommended by your health care provider. ?What happens during an annual well check? ?The services and screenings done by your health care provider during your annual well check will depend on your age, overall health, lifestyle risk factors, and family history of disease. ?Counseling  ?Your health care provider may ask you questions about your: ?Alcohol use. ?Tobacco use. ?Drug use. ?Emotional well-being. ?Home and relationship well-being. ?Sexual activity. ?Eating habits. ?History of falls. ?Memory and ability to understand (cognition). ?Work and work Statistician. ?Reproductive health. ?Screening  ?You may have the following tests or measurements: ?Height, weight, and BMI. ?Blood pressure. ?Lipid and cholesterol levels. These may be checked every 5 years, or more frequently if you are over 66 years old. ?Skin check. ?Lung cancer screening. You may have this screening every year starting at age 56 if you have a 30-pack-year history of smoking and currently smoke or have quit within the past 15 years. ?Fecal occult blood test (FOBT) of the stool. You may have this test every year starting at age 39. ?Flexible sigmoidoscopy or colonoscopy. You may have a sigmoidoscopy every 5 years or a colonoscopy every 10 years starting at age 22. ?Hepatitis C blood test. ?Hepatitis B blood test. ?Sexually transmitted disease (STD) testing. ?Diabetes screening. This is done by checking your blood sugar (glucose) after you have not eaten for a while (fasting). You may have this done every 1-3 years. ?Bone density scan. This is done to screen for osteoporosis. You may have this done starting at age 29. ?Mammogram. This may be done every 1-2 years. Talk to your health care provider about how often you should have regular mammograms. ?Talk with your health  care provider about your test results, treatment options, and if necessary, the need for more tests. ?Vaccines  ?Your health care  provider may recommend certain vaccines, such as: ?Influenza vaccine. This is recommended every year. ?Tetanus, diphtheria, and acellular pertussis (Tdap, Td) vaccine. You may need a Td booster every 10 years. ?Zoster vaccine. You may need this after age 68. ?Pneumococcal 13-valent conjugate (PCV13) vaccine. One dose is recommended after age 59. ?Pneumococcal polysaccharide (PPSV23) vaccine. One dose is recommended after age 69. ?Talk to your health care provider about which screenings and vaccines you need and how often you need them. ?This information is not intended to replace advice given to you by your health care provider. Make sure you discuss any questions you have with your health care provider. ?Document Released: 04/10/2015 Document Revised: 12/02/2015 Document Reviewed: 01/13/2015 ?Elsevier Interactive Patient Education ? 2017 [employer]. ? ?Fall Prevention in the Home ?Falls can cause injuries. They can happen to people of all ages. There are many things you can do to make your home safe and to help prevent falls. ?What can I do on the outside of my home? ?Regularly fix the edges of walkways and driveways and fix any cracks. ?Remove anything that might make you trip as you walk through a door, such as a raised step or threshold. ?Trim any bushes or trees on the path to your home. ?Use bright outdoor lighting. ?Clear any walking paths of anything that might make someone trip, such as rocks or tools. ?Regularly check to see if handrails are loose or broken. Make sure that both sides of any steps have handrails. ?Any raised decks and porches should have guardrails on the edges. ?Have any leaves, snow, or ice cleared regularly. ?Use sand or salt on walking paths during winter. ?Clean up any spills in your garage right away. This includes oil or grease spills. ?What can I do in the bathroom? ?Use night lights. ?Install grab bars by the toilet and in the tub and shower. Do not use towel bars as grab  bars. ?Use non-skid mats or decals in the tub or shower. ?If you need to sit down in the shower, use a plastic, non-slip stool. ?Keep the floor dry. Clean up any water that spills on the floor as soon as it happens. ?Remove soap buildup in the tub or shower regularly. ?Attach bath mats securely with double-sided non-slip rug tape. ?Do not have throw rugs and other things on the floor that can make you trip. ?What can I do in the bedroom? ?Use night lights. ?Make sure that you have a light by your bed that is easy to reach. ?Do not use any sheets or blankets that are too big for your bed. They should not hang down onto the floor. ?Have a firm chair that has side arms. You can use this for support while you get dressed. ?Do not have throw rugs and other things on the floor that can make you trip. ?What can I do in the kitchen? ?Clean up any spills right away. ?Avoid walking on wet floors. ?Keep items that you use a lot in easy-to-reach places. ?If you need to reach something above you, use a strong step stool that has a grab bar. ?Keep electrical cords out of the way. ?Do not use floor polish or wax that makes floors slippery. If you must use wax, use non-skid floor wax. ?Do not have throw rugs and other things on the floor that can make you trip. ?What can  I do with my stairs? ?Do not leave any items on the stairs. ?Make sure that there are handrails on both sides of the stairs and use them. Fix handrails that are broken or loose. Make sure that handrails are as long as the stairways. ?Check any carpeting to make sure that it is firmly attached to the stairs. Fix any carpet that is loose or worn. ?Avoid having throw rugs at the top or bottom of the stairs. If you do have throw rugs, attach them to the floor with carpet tape. ?Make sure that you have a light switch at the top of the stairs and the bottom of the stairs. If you do not have them, ask someone to add them for you. ?What else can I do to help prevent  falls? ?Wear shoes that: ?Do not have high heels. ?Have rubber bottoms. ?Are comfortable and fit you well. ?Are closed at the toe. Do not wear sandals. ?If you use a stepladder: ?Make sure that it is fully opened. Do not climb a

## 2021-07-27 NOTE — Progress Notes (Signed)
? ?Subjective:  ? Savannah Burns is a 71 y.o. female who presents for Medicare Annual (Subsequent) preventive examination. ? ?Virtual Visit via Telephone Note ? ?I connected with  [name] on 07/27/21 at  9:00 AM EDT by telephone and verified that I am speaking with the correct person using two identifiers. ? ?Location: ?Patient: Home ?Provider: [facility] ?Persons participating in the virtual visit: patient/Nurse Health Advisor ?  ?I discussed the limitations, risks, security and privacy concerns of performing an evaluation and management service by telephone and the availability of in person appointments. The patient expressed understanding and agreed to proceed. ? ?Interactive audio and video telecommunications were attempted between this nurse and patient, however failed, due to patient having technical difficulties OR patient did not have access to video capability.  We continued and completed visit with audio only. ? ?Some vital signs may be absent or patient reported.  ? ?Savannah Thune Dionne Ano, LPN  ? ?Review of Systems    ? ?Cardiac Risk Factors include: advanced age (>30mn, >>4women);diabetes mellitus;dyslipidemia;hypertension;sedentary lifestyle;obesity (BMI >30kg/m2);smoking/ tobacco exposure;Other (see comment), Risk factor comments: COPD, respiratory failure, CHF ? ?   ?Objective:  ?  ?Today's Vitals  ? 07/27/21 0903  ?Weight: 299 lb (135.6 kg)  ?PainSc: 3   ? ?Body mass index is 53.82 kg/m?. ? ? ?  07/27/2021  ?  9:18 AM 07/16/2021  ?  9:16 AM 07/09/2021  ?  9:24 AM 07/01/2021  ?  9:35 AM 06/25/2021  ? 10:49 AM 06/25/2021  ?  9:48 AM 06/18/2021  ?  9:58 AM  ?Advanced Directives  ?Does Patient Have a Medical Advance Directive? _0  Yes Yes  ?Type of AParamedicof [place]ol[place]Living will HCross LanesLiving will [place]Living will [place]Living will Living will;Healthcare Power of Attorney Living will;Healthcare  Power of Attorney Living will  ?Does patient want to make changes to medical advance directive?  No - Patient declined No - Patient declined No - Patient declined No - Patient declined No - Patient declined   ?Copy of [place] Chart? No - copy requested No - copy requested No - copy requested No - copy requested   No - copy requested  ?Would patient like information on creating a medical advance directive?  No - Patient declined No - Patient declined No - Patient declined No - Patient declined No - Patient declined No - Patient declined  ? ? ?Current Medications (verified) ?Outpatient Encounter Medications as of 07/27/2021  ?Medication Sig  ? baclofen (LIORESAL) 10 MG tablet Take by mouth.  ? Blood Glucose Calibration (GLUCOMETER DEX HIGH CONTROL) LIQD Test CBG's once daily.   ? Blood Glucose Monitoring Suppl (TRUE METRIX AIR GLUCOSE METER) w/Device KIT Check BS daily Dx E11.9  ? Cholecalciferol (VITAMIN D) 50 MCG (2000 UT) tablet Take 4,000 Units by mouth daily.  ? Cyanocobalamin (VITAMIN B-12 IJ) Inject 1,000 mcg as directed every 30 (thirty) days.  ? desvenlafaxine (PRISTIQ) 50 MG 24 hr tablet Take 1 tablet (50 mg total) by mouth daily.  ? esomeprazole (NEXIUM) 20 MG capsule Take 1 capsule (20 mg total) by mouth daily at 12 noon.  ? furosemide (LASIX) 20 MG tablet Take 20 mg by mouth. bid  ? glucose blood (TRUE METRIX BLOOD GLUCOSE TEST) test strip Check BS daily Dx E11.9  ? lovastatin (MEVACOR) 20 MG tablet Take 1 tablet (20 mg total) by mouth at bedtime.  ?  oxyCODONE-acetaminophen (PERCOCET) 10-325 MG tablet Take 1 tablet by mouth every 6 (six) hours as needed for pain.  ? OXYGEN Inhale 3 L into the lungs continuous.  ? potassium chloride SA (KLOR-CON) 20 MEQ tablet Take 2 tablets (40 mEq total) by mouth 2 (two) times daily. (Patient taking differently: Take 40 mEq by mouth daily.)  ? Semaglutide (RYBELSUS) 3 MG TABS Take 3 mg by mouth daily.  ? TRUEplus Lancets 30G MISC Check BS daily Dx  E11.9  ? ferrous sulfate 325 (65 FE) MG tablet Take 1 tablet (325 mg total) by mouth 2 (two) times daily with a meal. (Patient taking differently: Take 325 mg by mouth daily with breakfast.)  ? torsemide (DEMADEX) 20 MG tablet Take 3 tablets (60 mg total) by mouth 2 (two) times daily. (Patient taking differently: Take 50 mg by mouth 2 (two) times daily.)  ? [DISCONTINUED] albuterol (PROVENTIL HFA;VENTOLIN HFA) 108 (90 Base) MCG/ACT inhaler Inhale 2 puffs into the lungs every 6 (six) hours as needed for wheezing or shortness of breath.  ? [DISCONTINUED] erythromycin ophthalmic ointment Place 1 application into the left eye at bedtime. (Patient not taking: Reported on 07/27/2021)  ? [DISCONTINUED] vitamin B-12 (CYANOCOBALAMIN) 1000 MCG tablet Take 2,000 mcg by mouth daily.  ? ?No facility-administered encounter medications on file as of 07/27/2021.  ? ? ?Allergies (verified) ?Keflex [cephalexin]  ? ?History: ?Past Medical History:  ?Diagnosis Date  ? Adrenal adenoma, left   ? Stable  ? Anxiety   ? Arthritis   ? bilateral hands  ? Depression   ? Diabetes mellitus, type 2 ([place]) 08/12/2008  ? Qualifier: Diagnosis of  By: Deborra Medina MD, [place]    ? Dyspnea   ? Esophageal varices (HCC)   ? Grade II diastolic dysfunction   ? History of kidney stones   ? Hyperlipidemia   ? Hypertension   ? Lower back pain   ? Lower GI bleed 03/19/2020  ? Panic attacks   ? Pneumonia   ? currently taking antibiotic and prednisone for early stages of pneumonia  ? Pulmonary nodules   ? bilateral  ? Skin cancer   ? face  ? ?Past Surgical History:  ?Procedure Laterality Date  ? BIOPSY  04/07/2020  ? Procedure: BIOPSY;  Surgeon: Eloise Harman, DO;  [facility];  Service: Endoscopy;;  ? Breast Cystectomy  Right   ? CESAREAN SECTION    ? COLONOSCOPY WITH PROPOFOL N/A 01/25/2020  ? Dr. Abbey Chatters: Nonbleeding internal hemorrhoids, diverticulosis, 5 mm polyp removed from the ascending colon, 10 mm polyp removed from the sigmoid colon, 30 mm polyp  (tubulovillous adenoma with no high-grade dysplasia) removed from the transverse colon via piecemeal status post tattoo.  Other polyps were tubular adenomas.  3 month surveillance colonoscopy recommended.  ? COLONOSCOPY WITH PROPOFOL N/A 04/07/2020  ? Procedure: COLONOSCOPY WITH PROPOFOL;  Surgeon: Eloise Harman, DO;  [facility];  Service: Endoscopy;  Laterality: N/A;  3:00pm, pt knows new time per office  ? CYSTOSCOPY/URETEROSCOPY/HOLMIUM LASER/STENT PLACEMENT Bilateral 03/01/2019  ? Procedure: TLXBWIOMBT/DHRCBULAGTXMIWOEHOZYYQ/MGNOIBB LASER/STENT PLACEMENT;  Surgeon: Ceasar Mons, MD;  Location: [facility];  Service: Urology;  Laterality: Bilateral;  ONLY NEEDS 60 MIN  ? ESOPHAGOGASTRODUODENOSCOPY (EGD) WITH PROPOFOL N/A 01/25/2020  ? Dr. Abbey Chatters: 4 columns grade 1 esophageal varices  ? ESOPHAGOGASTRODUODENOSCOPY (EGD) WITH PROPOFOL N/A 05/18/2020  ? Procedure: ESOPHAGOGASTRODUODENOSCOPY (EGD) WITH PROPOFOL;  Surgeon: Eloise Harman, DO;  [facility];  Service: Endoscopy;  Laterality: N/A;  ?  ESOPHAGOGASTRODUODENOSCOPY (EGD) WITH PROPOFOL N/A 07/28/2020  ? Procedure: ESOPHAGOGASTRODUODENOSCOPY (EGD) WITH PROPOFOL;  Surgeon: Eloise Harman, DO;  [facility];  Service: Endoscopy;  Laterality: N/A;  am or early PM due to givens capsule placement  ? GIVENS CAPSULE STUDY N/A 05/18/2020  ? Procedure: GIVENS CAPSULE STUDY;  Surgeon: Harvel Quale, MD;  [facility];  Service: Gastroenterology;  Laterality: N/A;  ? GIVENS CAPSULE STUDY N/A 07/28/2020  ? Procedure: GIVENS CAPSULE STUDY;  Surgeon: Eloise Harman, DO;  [facility];  Service: Endoscopy;  Laterality: N/A;  ? POLYPECTOMY  01/25/2020  ? Procedure: POLYPECTOMY;  Surgeon: Eloise Harman, DO;  [facility];  Service: Endoscopy;;  ? POLYPECTOMY  04/07/2020  ? Procedure: POLYPECTOMY INTESTINAL;  Surgeon: Eloise Harman, DO;  [facility];  Service:  Endoscopy;;  ? SKIN CANCER EXCISION    ? Face  ? SPINE SURGERY    ? SUBMUCOSAL TATTOO INJECTION  01/25/2020  ? Procedure: SUBMUCOSAL TATTOO INJECTION;  Surgeon: Eloise Harman, DO;  [facility];  Service

## 2021-07-28 ENCOUNTER — Encounter: Payer: Self-pay | Admitting: Family Medicine

## 2021-07-28 DIAGNOSIS — R030 Elevated blood-pressure reading, without diagnosis of hypertension: Secondary | ICD-10-CM | POA: Diagnosis not present

## 2021-07-28 DIAGNOSIS — M5136 Other intervertebral disc degeneration, lumbar region: Secondary | ICD-10-CM | POA: Diagnosis not present

## 2021-07-28 DIAGNOSIS — Z6841 Body Mass Index (BMI) 40.0 and over, adult: Secondary | ICD-10-CM | POA: Diagnosis not present

## 2021-07-28 DIAGNOSIS — Z79899 Other long term (current) drug therapy: Secondary | ICD-10-CM | POA: Diagnosis not present

## 2021-07-28 DIAGNOSIS — G894 Chronic pain syndrome: Secondary | ICD-10-CM | POA: Diagnosis not present

## 2021-07-28 DIAGNOSIS — E559 Vitamin D deficiency, unspecified: Secondary | ICD-10-CM | POA: Diagnosis not present

## 2021-07-28 DIAGNOSIS — E119 Type 2 diabetes mellitus without complications: Secondary | ICD-10-CM | POA: Diagnosis not present

## 2021-07-30 ENCOUNTER — Inpatient Hospital Stay (HOSPITAL_COMMUNITY): Payer: Medicare PPO

## 2021-07-30 ENCOUNTER — Inpatient Hospital Stay (HOSPITAL_COMMUNITY): Payer: Medicare PPO | Attending: Hematology | Admitting: Physician Assistant

## 2021-07-30 ENCOUNTER — Other Ambulatory Visit (HOSPITAL_COMMUNITY): Payer: Self-pay

## 2021-07-30 VITALS — BP 138/58 | HR 105 | Temp 98.7°F | Ht 62.5 in | Wt 299.0 lb

## 2021-07-30 VITALS — BP 113/74 | HR 85 | Temp 98.6°F | Resp 19

## 2021-07-30 DIAGNOSIS — J9611 Chronic respiratory failure with hypoxia: Secondary | ICD-10-CM | POA: Diagnosis not present

## 2021-07-30 DIAGNOSIS — I8500 Esophageal varices without bleeding: Secondary | ICD-10-CM | POA: Diagnosis not present

## 2021-07-30 DIAGNOSIS — R5383 Other fatigue: Secondary | ICD-10-CM | POA: Diagnosis not present

## 2021-07-30 DIAGNOSIS — C9000 Multiple myeloma not having achieved remission: Secondary | ICD-10-CM | POA: Diagnosis not present

## 2021-07-30 DIAGNOSIS — K5521 Angiodysplasia of colon with hemorrhage: Secondary | ICD-10-CM | POA: Diagnosis not present

## 2021-07-30 DIAGNOSIS — D472 Monoclonal gammopathy: Secondary | ICD-10-CM

## 2021-07-30 DIAGNOSIS — I5020 Unspecified systolic (congestive) heart failure: Secondary | ICD-10-CM | POA: Diagnosis not present

## 2021-07-30 DIAGNOSIS — I10 Essential (primary) hypertension: Secondary | ICD-10-CM | POA: Diagnosis not present

## 2021-07-30 DIAGNOSIS — E1169 Type 2 diabetes mellitus with other specified complication: Secondary | ICD-10-CM | POA: Diagnosis not present

## 2021-07-30 DIAGNOSIS — D500 Iron deficiency anemia secondary to blood loss (chronic): Secondary | ICD-10-CM

## 2021-07-30 DIAGNOSIS — J449 Chronic obstructive pulmonary disease, unspecified: Secondary | ICD-10-CM | POA: Diagnosis not present

## 2021-07-30 DIAGNOSIS — Z85828 Personal history of other malignant neoplasm of skin: Secondary | ICD-10-CM | POA: Insufficient documentation

## 2021-07-30 DIAGNOSIS — I110 Hypertensive heart disease with heart failure: Secondary | ICD-10-CM | POA: Diagnosis not present

## 2021-07-30 DIAGNOSIS — R188 Other ascites: Secondary | ICD-10-CM | POA: Diagnosis not present

## 2021-07-30 DIAGNOSIS — D649 Anemia, unspecified: Secondary | ICD-10-CM

## 2021-07-30 DIAGNOSIS — D696 Thrombocytopenia, unspecified: Secondary | ICD-10-CM | POA: Diagnosis not present

## 2021-07-30 DIAGNOSIS — I5032 Chronic diastolic (congestive) heart failure: Secondary | ICD-10-CM | POA: Diagnosis not present

## 2021-07-30 DIAGNOSIS — K31811 Angiodysplasia of stomach and duodenum with bleeding: Secondary | ICD-10-CM | POA: Diagnosis not present

## 2021-07-30 DIAGNOSIS — Z87891 Personal history of nicotine dependence: Secondary | ICD-10-CM | POA: Diagnosis not present

## 2021-07-30 DIAGNOSIS — E785 Hyperlipidemia, unspecified: Secondary | ICD-10-CM | POA: Diagnosis not present

## 2021-07-30 DIAGNOSIS — E8809 Other disorders of plasma-protein metabolism, not elsewhere classified: Secondary | ICD-10-CM | POA: Diagnosis not present

## 2021-07-30 DIAGNOSIS — R0609 Other forms of dyspnea: Secondary | ICD-10-CM | POA: Diagnosis not present

## 2021-07-30 DIAGNOSIS — K922 Gastrointestinal hemorrhage, unspecified: Secondary | ICD-10-CM | POA: Diagnosis not present

## 2021-07-30 DIAGNOSIS — D62 Acute posthemorrhagic anemia: Secondary | ICD-10-CM | POA: Diagnosis not present

## 2021-07-30 DIAGNOSIS — K921 Melena: Secondary | ICD-10-CM | POA: Diagnosis not present

## 2021-07-30 DIAGNOSIS — R42 Dizziness and giddiness: Secondary | ICD-10-CM | POA: Insufficient documentation

## 2021-07-30 DIAGNOSIS — K259 Gastric ulcer, unspecified as acute or chronic, without hemorrhage or perforation: Secondary | ICD-10-CM | POA: Diagnosis not present

## 2021-07-30 DIAGNOSIS — I8510 Secondary esophageal varices without bleeding: Secondary | ICD-10-CM | POA: Diagnosis not present

## 2021-07-30 DIAGNOSIS — Z66 Do not resuscitate: Secondary | ICD-10-CM | POA: Diagnosis not present

## 2021-07-30 DIAGNOSIS — D509 Iron deficiency anemia, unspecified: Secondary | ICD-10-CM | POA: Diagnosis not present

## 2021-07-30 DIAGNOSIS — R0602 Shortness of breath: Secondary | ICD-10-CM | POA: Diagnosis not present

## 2021-07-30 DIAGNOSIS — E538 Deficiency of other specified B group vitamins: Secondary | ICD-10-CM | POA: Insufficient documentation

## 2021-07-30 DIAGNOSIS — Z87442 Personal history of urinary calculi: Secondary | ICD-10-CM | POA: Diagnosis not present

## 2021-07-30 DIAGNOSIS — Z79899 Other long term (current) drug therapy: Secondary | ICD-10-CM | POA: Insufficient documentation

## 2021-07-30 DIAGNOSIS — R6889 Other general symptoms and signs: Secondary | ICD-10-CM | POA: Diagnosis not present

## 2021-07-30 DIAGNOSIS — K7581 Nonalcoholic steatohepatitis (NASH): Secondary | ICD-10-CM | POA: Diagnosis not present

## 2021-07-30 DIAGNOSIS — K3189 Other diseases of stomach and duodenum: Secondary | ICD-10-CM | POA: Diagnosis not present

## 2021-07-30 DIAGNOSIS — E119 Type 2 diabetes mellitus without complications: Secondary | ICD-10-CM | POA: Insufficient documentation

## 2021-07-30 DIAGNOSIS — Z8719 Personal history of other diseases of the digestive system: Secondary | ICD-10-CM | POA: Diagnosis not present

## 2021-07-30 DIAGNOSIS — K7460 Unspecified cirrhosis of liver: Secondary | ICD-10-CM | POA: Diagnosis not present

## 2021-07-30 LAB — CBC WITH DIFFERENTIAL/PLATELET
Abs Immature Granulocytes: 0.05 10*3/uL (ref 0.00–0.07)
Basophils Absolute: 0 10*3/uL (ref 0.0–0.1)
Basophils Relative: 0 %
Eosinophils Absolute: 0 10*3/uL (ref 0.0–0.5)
Eosinophils Relative: 1 %
HCT: 18.1 % — ABNORMAL LOW (ref 36.0–46.0)
Hemoglobin: 5 g/dL — CL (ref 12.0–15.0)
Immature Granulocytes: 1 %
Lymphocytes Relative: 16 %
Lymphs Abs: 1.1 10*3/uL (ref 0.7–4.0)
MCH: 26.7 pg (ref 26.0–34.0)
MCHC: 27.6 g/dL — ABNORMAL LOW (ref 30.0–36.0)
MCV: 96.8 fL (ref 80.0–100.0)
Monocytes Absolute: 0.5 10*3/uL (ref 0.1–1.0)
Monocytes Relative: 7 %
Neutro Abs: 5.3 10*3/uL (ref 1.7–7.7)
Neutrophils Relative %: 75 %
Platelets: 117 10*3/uL — ABNORMAL LOW (ref 150–400)
RBC: 1.87 MIL/uL — ABNORMAL LOW (ref 3.87–5.11)
RDW: 17.8 % — ABNORMAL HIGH (ref 11.5–15.5)
WBC: 7 10*3/uL (ref 4.0–10.5)
nRBC: 0 % (ref 0.0–0.2)

## 2021-07-30 LAB — PREPARE RBC (CROSSMATCH)

## 2021-07-30 LAB — FERRITIN: Ferritin: 6 ng/mL — ABNORMAL LOW (ref 11–307)

## 2021-07-30 LAB — IRON AND TIBC
Iron: 17 ug/dL — ABNORMAL LOW (ref 28–170)
Saturation Ratios: 4 % — ABNORMAL LOW (ref 10.4–31.8)
TIBC: 424 ug/dL (ref 250–450)
UIBC: 407 ug/dL

## 2021-07-30 MED ORDER — SODIUM CHLORIDE 0.9% IV SOLUTION
250.0000 mL | Freq: Once | INTRAVENOUS | Status: AC
  Administered 2021-07-30: 250 mL via INTRAVENOUS

## 2021-07-30 MED ORDER — CYANOCOBALAMIN 1000 MCG/ML IJ SOLN
1000.0000 ug | Freq: Once | INTRAMUSCULAR | Status: AC
  Administered 2021-07-30: 1000 ug via INTRAMUSCULAR
  Filled 2021-07-30: qty 1

## 2021-07-30 MED ORDER — DIPHENHYDRAMINE HCL 25 MG PO CAPS
25.0000 mg | ORAL_CAPSULE | Freq: Once | ORAL | Status: AC
  Administered 2021-07-30: 25 mg via ORAL
  Filled 2021-07-30: qty 1

## 2021-07-30 MED ORDER — ACETAMINOPHEN 325 MG PO TABS
650.0000 mg | ORAL_TABLET | Freq: Once | ORAL | Status: AC
  Administered 2021-07-30: 650 mg via ORAL
  Filled 2021-07-30: qty 2

## 2021-07-30 NOTE — Progress Notes (Signed)
CRITICAL VALUE ALERT ?Critical value received:  Hemoglobin=5.0  ?Date of notification:  07/30/2021 ?Time of notification: 104 ?Critical value read back:  Yes.   ?Nurse who received alert:  Reino Kent, RN ?MD notified time and response:  Tarri Abernethy, PA-C notified.  Patient has an office visit today. Patient will receive blood transfusion.  ?

## 2021-07-30 NOTE — Progress Notes (Signed)
Patient presents today for blood transfusions.  Patient is in stable condition with only new complaints of black tarry stools.  Tarri Abernethy, PA-C was made aware during her office visit.  Labs reviewed by PA during her office visit also.  Hemoglobin today is 5.0.  Patient will receive two units of blood today per orders.  We will proceed with transfusion per orders.  ? ?Patient tolerated transfusions well with no complaints voiced.  Patient left via wheelchair with granddaughter in stable condition.  Vital signs stable at discharge.  Follow up as scheduled.    ?

## 2021-07-30 NOTE — Patient Instructions (Addendum)
[place] at [facility] ?Discharge Instructions ? ?You were seen today by Tarri Abernethy PA-C for your follow-up visit. ? ?ANEMIA: ?I suspect that you are having acute bleeding from your intestines, which has caused severe acute blood loss anemia.  Your hemoglobin was 5.0 today.  We gave 2 units of red blood cells in clinic.  I have recommended immediate hospitalization/direct admission to [facility] for further evaluation, but respect your decision to drive yourself to [facility] to seek hospitalization at that facility, with understanding and acceptance of the risks of self transportation.  I have called your GI doctor at [place] (Dr. Durene Fruits), so that she is aware that you may be admitted to the hospital there. ?We will continue to check your blood once a week with possible blood transfusions if needed. ?We will schedule you for IV iron every 2 weeks. ? ? 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  This does not definitively show progression of your disease, but we will need to watch closely. ?We will check skull x-ray in 3 months. ?We will repeat your myeloma labs in 3 months. ? ?B12 DEFICIENCY: Continue monthly B12 injections at [facility]. ? ?FOLLOW-UP APPOINTMENT: Office visit with Dr. Delton Coombes in 3 months, the week after labs and x-rays. ? ? ?Thank you for choosing [place] at [facility] to provide your oncology and hematology care.  To afford each patient quality time with our provider, please arrive at least 15 minutes before your scheduled appointment time.  ? ?If you have a lab appointment with the [place] please come in thru the Main Entrance and check in at the main information desk. ? ?You need to re-schedule your appointment should you arrive 10 or more minutes late.  We strive to give you quality time with our providers, and arriving  late affects you and other patients whose appointments are after yours.  Also, if you no show three or more times for appointments you may be dismissed from the clinic at the providers discretion.     ?Again, thank you for choosing [facility].  Our hope is that these requests will decrease the amount of time that you wait before being seen by our physicians.       ?_____________________________________________________________ ? ?Should you have questions after your visit to [facility], please contact our office at [phone] and follow the prompts.  Our office hours are 8:00 a.m. and 4:30 p.m. Monday - Friday.  Please note that voicemails left after 4:00 p.m. may not be returned until the following business day.  We are closed weekends and major holidays.  You do have access to a nurse 24-7, just call the main number to the clinic [phone] and do not press any options, hold on the line and a nurse will answer the phone.   ? ?For prescription refill requests, have your pharmacy contact our office and allow 72 hours.   ? ?Due to Covid, you will need to wear a mask upon entering the hospital. If you do not have a mask, a mask will be given to you at the Main Entrance upon arrival. For doctor visits, patients may have 1 support person age 54 or older with them. For treatment visits, patients can not have anyone with them due to social distancing guidelines and our immunocompromised population.  ? ? ? ?

## 2021-07-30 NOTE — Progress Notes (Addendum)
? ?[facility] ?[address] ?[place], [place] [address] ? ? ?CLINIC:  ?Medical Oncology/Hematology ? ?PCP:  ?[name], DO ?[address] ?[place] [address] ?[phone] ? ? 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 ?CURRENT THERAPY: PRBC transfusions and IV iron ? ?INTERVAL HISTORY:  ?Ms. Ide 71 y.o. female returns for routine follow-up of her iron deficiency anemia and smoldering myeloma.  She was last seen by Tarri Abernethy PA-C on 03/31/2021. ? ?At today's visit, she reports feeling poorly. ?Over the past 3 months, she has had worsening in her anemia with frequent drops in Hgb. ?She has required more than 10 units PRBC since her last visit on 03/31/2021. ?She received Venofer 1200 mg in divided doses from 06/04/2021 - 06/25/2021. ? ?Hgb today is noted to be 5.0, she reports that she has been having black tarry bowel movements for the past week.  She reports extreme fatigue, dyspnea on exertion, and lightheadedness.  No chest pain, syncopal episodes, headaches. ? ?Regarding her smoldering myeloma, she denies any new bone pain or recent fractures. She denies any B symptoms such as fever, chills, or unintentional weight loss. No new neurologic symptoms such as tinnitus, new-onset hearing loss, blurred vision, headache, or dizziness.  Denies any numbness or tingling in hands or feet.  No thromboembolic events since her last visit.  No new masses or lymphadenopathy per her report. ? ?She has 40% energy and 100% appetite. She endorses that she is maintaining a stable weight. ? ? ?REVIEW OF SYSTEMS:  ?Review of Systems  ?Constitutional:  Positive for fatigue. Negative for appetite change, chills, diaphoresis, fever and unexpected weight change.  ?HENT:   Negative for lump/mass and nosebleeds.   ?Eyes:  Negative for eye problems.  ?Respiratory:  Positive for shortness of breath. Negative for cough and hemoptysis.   ?Cardiovascular:  Negative for chest  pain, leg swelling and palpitations.  ?Gastrointestinal:  Positive for blood in stool. Negative for abdominal pain, constipation, diarrhea, nausea and vomiting.  ?Genitourinary:  Negative for hematuria.   ?Skin: Negative.   ?Neurological:  Negative for dizziness, headaches and light-headedness.  ?Hematological:  Does not bruise/bleed easily.   ? ? ?PAST MEDICAL/SURGICAL HISTORY:  ?Past Medical History:  ?Diagnosis Date  ? Adrenal adenoma, left   ? Stable  ? Anxiety   ? Arthritis   ? bilateral hands  ? Depression   ? Diabetes mellitus, type 2 ([facility] [place]) 08/12/2008  ? Qualifier: Diagnosis of  By: Deborra Medina MD, [place]    ? Dyspnea   ? Esophageal varices (HCC)   ? Grade II diastolic dysfunction   ? History of kidney stones   ? Hyperlipidemia   ? Hypertension   ? Lower back pain   ? Lower GI bleed 03/19/2020  ? Panic attacks   ? Pneumonia   ? currently taking antibiotic and prednisone for early stages of pneumonia  ? Pulmonary nodules   ? bilateral  ? Skin cancer   ? face  ? ?Past Surgical History:  ?Procedure Laterality Date  ? BIOPSY  04/07/2020  ? Procedure: BIOPSY;  Surgeon: Eloise Harman, DO;  [facility];  Service: Endoscopy;;  ? Breast Cystectomy  Right   ? CESAREAN SECTION    ? COLONOSCOPY WITH PROPOFOL N/A 01/25/2020  ? Dr. Abbey Chatters: Nonbleeding internal hemorrhoids, diverticulosis, 5 mm polyp removed from the ascending colon, 10 mm polyp removed from the sigmoid colon, 30 mm polyp (tubulovillous adenoma with no  high-grade dysplasia) removed from the transverse colon via piecemeal status post tattoo.  Other polyps were tubular adenomas.  3 month surveillance colonoscopy recommended.  ? COLONOSCOPY WITH PROPOFOL N/A 04/07/2020  ? Procedure: COLONOSCOPY WITH PROPOFOL;  Surgeon: Eloise Harman, DO;  [facility];  Service: Endoscopy;  Laterality: N/A;  3:00pm, pt knows new time per office  ? CYSTOSCOPY/URETEROSCOPY/HOLMIUM LASER/STENT PLACEMENT Bilateral 03/01/2019  ? Procedure:  ZOXWRUEAVW/[id number]/QIONGEX LASER/STENT PLACEMENT;  Surgeon: Ceasar Mons, MD;  Location: [facility];  Service: Urology;  Laterality: Bilateral;  ONLY NEEDS 60 MIN  ? ESOPHAGOGASTRODUODENOSCOPY (EGD) WITH PROPOFOL N/A 01/25/2020  ? Dr. Abbey Chatters: 4 columns grade 1 esophageal varices  ? ESOPHAGOGASTRODUODENOSCOPY (EGD) WITH PROPOFOL N/A 05/18/2020  ? Procedure: ESOPHAGOGASTRODUODENOSCOPY (EGD) WITH PROPOFOL;  Surgeon: Eloise Harman, DO;  [facility];  Service: Endoscopy;  Laterality: N/A;  ? ESOPHAGOGASTRODUODENOSCOPY (EGD) WITH PROPOFOL N/A 07/28/2020  ? Procedure: ESOPHAGOGASTRODUODENOSCOPY (EGD) WITH PROPOFOL;  Surgeon: Eloise Harman, DO;  [facility];  Service: Endoscopy;  Laterality: N/A;  am or early PM due to givens capsule placement  ? GIVENS CAPSULE STUDY N/A 05/18/2020  ? Procedure: GIVENS CAPSULE STUDY;  Surgeon: Harvel Quale, MD;  [facility];  Service: Gastroenterology;  Laterality: N/A;  ? GIVENS CAPSULE STUDY N/A 07/28/2020  ? Procedure: GIVENS CAPSULE STUDY;  Surgeon: Eloise Harman, DO;  [facility];  Service: Endoscopy;  Laterality: N/A;  ? POLYPECTOMY  01/25/2020  ? Procedure: POLYPECTOMY;  Surgeon: Eloise Harman, DO;  [facility];  Service: Endoscopy;;  ? POLYPECTOMY  04/07/2020  ? Procedure: POLYPECTOMY INTESTINAL;  Surgeon: Eloise Harman, DO;  [facility];  Service: Endoscopy;;  ? SKIN CANCER EXCISION    ? Face  ? SPINE SURGERY    ? SUBMUCOSAL TATTOO INJECTION  01/25/2020  ? Procedure: SUBMUCOSAL TATTOO INJECTION;  Surgeon: Eloise Harman, DO;  [facility];  Service: Endoscopy;;  ? ? ? ?SOCIAL HISTORY:  ?Social History  ? ?Socioeconomic History  ? Marital status: Widowed  ?  Spouse name: Not on file  ? Number of children: 2  ? Years of education: 65  ? Highest education level: Not on file  ?Occupational History  ? Occupation: Retired   ?Tobacco Use  ? Smoking  status: Former  ?  Packs/day: 1.50  ?  Years: 40.00  ?  Pack years: 60.00  ?  Types: Cigarettes  ?  Quit date: 04/29/2015  ?  Years since quitting: 6.2  ? Smokeless tobacco: Never  ? Tobacco comments:  ?  Quit smoking 04/2015- Previous 1.5 ppd smoker  ?Vaping Use  ? Vaping Use: Never used  ?Substance and Sexual Activity  ? Alcohol use: No  ?  Alcohol/week: 0.0 standard drinks  ? Drug use: No  ? Sexual activity: Not Currently  ?  Birth control/protection: Post-menopausal  ?Other Topics Concern  ? Not on file  ?Social History Narrative  ? Her 66 year old granddaughter lives with her - one daughter lives nearby, but she doesn't have a good relationship with her. Has a great relationship with other daughter who lives 1.5 hrs away - talks to her daily on the phone.  ? ?Social Determinants of Health  ? ?Financial Resource Strain: Medium Risk  ? Difficulty of Paying Living Expenses: Somewhat hard  ?Food Insecurity: Food Insecurity Present  ? Worried About Charity fundraiser in the Last Year: Sometimes true  ? Ran  Out of Food in the Last Year: Never true  ?Transportation Needs: No Transportation Needs  ? Lack of Transportation (Medical): No  ? Lack of Transportation (Non-Medical): No  ?Physical Activity: Inactive  ? Days of Exercise per Week: 0 days  ? Minutes of Exercise per Session: 0 min  ?Stress: Stress Concern Present  ? Feeling of Stress : To some extent  ?Social Connections: Socially Isolated  ? Frequency of Communication with Friends and Family: More than three times a week  ? Frequency of Social Gatherings with Friends and Family: Once a week  ? Attends Religious Services: Never  ? Active Member of Clubs or Organizations: No  ? Attends Archivist Meetings: Never  ? Marital Status: Widowed  ?Intimate Partner Violence: Not At Risk  ? Fear of Current or Ex-Partner: No  ? Emotionally Abused: No  ? Physically Abused: No  ? Sexually Abused: No  ? ? ?FAMILY HISTORY:  ?Family History  ?Problem Relation Age of Onset   ? Diabetes Father   ? Heart disease Father 66  ?     MI  ? Hypertension Father   ? Anemia Mother   ?     Transfusion dependent  ? COPD Sister   ? Cancer Paternal Grandmother 27  ?     Pancreatic  ? ? ?CURRENT MEDICATIONS:  ?Outp

## 2021-07-30 NOTE — Progress Notes (Unsigned)
CRITICAL VALUE ALERT ?Critical value received:  hemoglobin 5.0 ?Date of notification:  07-30-21 ?Time of notification: 31 ?Critical value read back:  Yes.   ?Nurse who received alert:  c. Evia Goldsmith RN ?MD notified time and response:  0211, give 2 units ?  ?

## 2021-07-30 NOTE — Progress Notes (Signed)
Patient tolerated Vitamin B 12 injection with no complaints voiced.  Site clean and dry with no bruising or swelling noted.  No complaints of pain.  Discharged with vital signs stable and no signs or symptoms of distress noted.   ?

## 2021-08-02 ENCOUNTER — Inpatient Hospital Stay (HOSPITAL_COMMUNITY): Payer: Medicare PPO

## 2021-08-02 LAB — BPAM RBC
Blood Product Expiration Date: 202306032359
Blood Product Expiration Date: 202306082359
ISSUE DATE / TIME: 202305051046
ISSUE DATE / TIME: 202305051236
Unit Type and Rh: 6200
Unit Type and Rh: 6200

## 2021-08-02 LAB — TYPE AND SCREEN
ABO/RH(D): A POS
Antibody Screen: NEGATIVE
Unit division: 0
Unit division: 0

## 2021-08-03 DIAGNOSIS — Z79899 Other long term (current) drug therapy: Secondary | ICD-10-CM | POA: Diagnosis not present

## 2021-08-04 DIAGNOSIS — K7460 Unspecified cirrhosis of liver: Secondary | ICD-10-CM | POA: Insufficient documentation

## 2021-08-05 ENCOUNTER — Telehealth: Payer: Self-pay

## 2021-08-05 NOTE — Telephone Encounter (Addendum)
Transition Care Management Unsuccessful Follow-up Telephone Call ? ?Date of discharge and from where:  08/04/21 - [facility] ? ?Attempts:  1st Attempt ? ?Reason for unsuccessful TCM follow-up call:  Left voice message ? ?  ?Transition Care Management Unsuccessful Follow-up Telephone Call ? ?Date of discharge and from where:  08/04/21 - [facility] ? ?Attempts:  2nd Attempt ? ?Reason for unsuccessful TCM follow-up call:  Unable to leave message Voice mail said unavailable ? ?Transition Care Management Unsuccessful Follow-up Telephone Call ? ?Date of discharge and from where:  08/04/21 - [facility] ? ?Attempts:  3rd Attempt ? ?Reason for unsuccessful TCM follow-up call:  Left voice message at second number listed in chart - VM said it was Sonia Baller ? ?  ?*This patient has an appt for 08/12/21 for oxygen testing in 30 minutes - I added TCM to this visit - If patient calls back, me or CCM Nurse need to ask TCM questions.  ?  ?

## 2021-08-06 ENCOUNTER — Inpatient Hospital Stay (HOSPITAL_COMMUNITY): Payer: Medicare PPO

## 2021-08-06 VITALS — BP 124/68 | HR 82 | Temp 98.1°F | Resp 20

## 2021-08-06 DIAGNOSIS — R5383 Other fatigue: Secondary | ICD-10-CM | POA: Diagnosis not present

## 2021-08-06 DIAGNOSIS — R0609 Other forms of dyspnea: Secondary | ICD-10-CM | POA: Diagnosis not present

## 2021-08-06 DIAGNOSIS — I10 Essential (primary) hypertension: Secondary | ICD-10-CM | POA: Diagnosis not present

## 2021-08-06 DIAGNOSIS — E538 Deficiency of other specified B group vitamins: Secondary | ICD-10-CM | POA: Diagnosis not present

## 2021-08-06 DIAGNOSIS — D500 Iron deficiency anemia secondary to blood loss (chronic): Secondary | ICD-10-CM

## 2021-08-06 DIAGNOSIS — C9000 Multiple myeloma not having achieved remission: Secondary | ICD-10-CM | POA: Diagnosis not present

## 2021-08-06 DIAGNOSIS — E785 Hyperlipidemia, unspecified: Secondary | ICD-10-CM | POA: Diagnosis not present

## 2021-08-06 DIAGNOSIS — R42 Dizziness and giddiness: Secondary | ICD-10-CM | POA: Diagnosis not present

## 2021-08-06 DIAGNOSIS — E119 Type 2 diabetes mellitus without complications: Secondary | ICD-10-CM | POA: Diagnosis not present

## 2021-08-06 LAB — SAMPLE TO BLOOD BANK

## 2021-08-06 LAB — CBC
HCT: 26.8 % — ABNORMAL LOW (ref 36.0–46.0)
Hemoglobin: 7.7 g/dL — ABNORMAL LOW (ref 12.0–15.0)
MCH: 26.7 pg (ref 26.0–34.0)
MCHC: 28.7 g/dL — ABNORMAL LOW (ref 30.0–36.0)
MCV: 93.1 fL (ref 80.0–100.0)
Platelets: 139 10*3/uL — ABNORMAL LOW (ref 150–400)
RBC: 2.88 MIL/uL — ABNORMAL LOW (ref 3.87–5.11)
RDW: 15.7 % — ABNORMAL HIGH (ref 11.5–15.5)
WBC: 5 10*3/uL (ref 4.0–10.5)
nRBC: 0 % (ref 0.0–0.2)

## 2021-08-06 MED ORDER — SODIUM CHLORIDE 0.9 % IV SOLN
300.0000 mg | Freq: Once | INTRAVENOUS | Status: AC
  Administered 2021-08-06: 300 mg via INTRAVENOUS
  Filled 2021-08-06: qty 300

## 2021-08-06 MED ORDER — LORATADINE 10 MG PO TABS
10.0000 mg | ORAL_TABLET | Freq: Once | ORAL | Status: AC
  Administered 2021-08-06: 10 mg via ORAL
  Filled 2021-08-06: qty 1

## 2021-08-06 MED ORDER — SODIUM CHLORIDE 0.9 % IV SOLN: Freq: Once | INTRAVENOUS | Status: AC

## 2021-08-06 NOTE — Progress Notes (Signed)
Patient presents today for iron infusion.  Patient is in satisfactory condition with no complaints voiced.  Vital signs are stable.  Labs reviewed.  Hemoglobin today is 7.7 and patient is asymptomatic.  No need for blood transfusion today per orders.  We will proceed with iron per MD orders.  ? ?Patient tolerated treatment well with no complaints voiced.  Patient left via wheelchair in stable condition.  Vital signs stable at discharge.  Follow up as scheduled.    ?

## 2021-08-06 NOTE — Patient Instructions (Signed)
[facility]  Discharge Instructions: Thank you for choosing [facility] to provide your oncology and hematology care.  If you have a lab appointment with the Cancer Center, please come in thru the Main Entrance and check in at the main information desk.  Wear comfortable clothing and clothing appropriate for easy access to any Portacath or PICC line.   We strive to give you quality time with your provider. You may need to reschedule your appointment if you arrive late (15 or more minutes).  Arriving late affects you and other patients whose appointments are after yours.  Also, if you miss three or more appointments without notifying the office, you may be dismissed from the clinic at the provider's discretion.      For prescription refill requests, have your pharmacy contact our office and allow 72 hours for refills to be completed.        To help prevent nausea and vomiting after your treatment, we encourage you to take your nausea medication as directed.  BELOW ARE SYMPTOMS THAT SHOULD BE REPORTED IMMEDIATELY: *FEVER GREATER THAN 100.4 F (38 C) OR HIGHER *CHILLS OR SWEATING *NAUSEA AND VOMITING THAT IS NOT CONTROLLED WITH YOUR NAUSEA MEDICATION *UNUSUAL SHORTNESS OF BREATH *UNUSUAL BRUISING OR BLEEDING *URINARY PROBLEMS (pain or burning when urinating, or frequent urination) *BOWEL PROBLEMS (unusual diarrhea, constipation, pain near the anus) TENDERNESS IN MOUTH AND THROAT WITH OR WITHOUT PRESENCE OF ULCERS (sore throat, sores in mouth, or a toothache) UNUSUAL RASH, SWELLING OR PAIN  UNUSUAL VAGINAL DISCHARGE OR ITCHING   Items with * indicate a potential emergency and should be followed up as soon as possible or go to the Emergency Department if any problems should occur.  Please show the CHEMOTHERAPY ALERT CARD or IMMUNOTHERAPY ALERT CARD at check-in to the Emergency Department and triage nurse.  Should you have questions after your visit or need to cancel  or reschedule your appointment, please contact  CANCER CENTER [phone]  and follow the prompts.  Office hours are 8:00 a.m. to 4:30 p.m. Monday - Friday. Please note that voicemails left after 4:00 p.m. may not be returned until the following business day.  We are closed weekends and major holidays. You have access to a nurse at all times for urgent questions. Please call the main number to the clinic [phone] and follow the prompts.  For any non-urgent questions, you may also contact your provider using MyChart. We now offer e-Visits for anyone 18 and older to request care online for non-urgent symptoms. For details visit [email or web address].   Also download the MyChart app! Go to the app store, search "MyChart", open the app, select [facility], and log in with your MyChart username and password.  Due to Covid, a mask is required upon entering the hospital/clinic. If you do not have a mask, one will be given to you upon arrival. For doctor visits, patients may have 1 support person aged 18 or older with them. For treatment visits, patients cannot have anyone with them due to current Covid guidelines and our immunocompromised population.  

## 2021-08-09 ENCOUNTER — Telehealth: Payer: Medicare PPO

## 2021-08-09 ENCOUNTER — Telehealth: Payer: Self-pay | Admitting: Licensed Clinical Social Worker

## 2021-08-09 NOTE — Telephone Encounter (Signed)
?  Chronic Care Management  ?  ? Clinical Social Work Note ?  ?08/09/21 ?Name: KONA YUSUF     MRN: [id number]       DOB: [birth year] ?  ?TARRA PENCE is a 71 y.o. year old female who is a primary care patient of [name], DO. The CCM team was consulted to assist the patient with chronic disease management and/or care coordination needs related to: [employer] .  ? ?LCSW called client phone number several times today but LCSW was unable to speak via phone with client.  LCSW did leave a phone message for [name], asking her to please call LCSW at [phone]. ? ?Follow Up Plan:  Care Guide scheduler to contact client to re-schedule LCSW phone visit with client ? ?[name].Hansel Devan MSW, LCSW ?Licensed Clinical Social Worker ?[facility] ?[phone]  ?  ?

## 2021-08-12 ENCOUNTER — Encounter (HOSPITAL_COMMUNITY): Payer: Self-pay | Admitting: Hematology

## 2021-08-12 ENCOUNTER — Telehealth: Payer: Self-pay

## 2021-08-12 ENCOUNTER — Other Ambulatory Visit (HOSPITAL_COMMUNITY): Payer: Self-pay | Admitting: Physician Assistant

## 2021-08-12 ENCOUNTER — Ambulatory Visit: Payer: Medicare PPO | Admitting: Family Medicine

## 2021-08-12 ENCOUNTER — Encounter: Payer: Self-pay | Admitting: Family Medicine

## 2021-08-12 VITALS — BP 126/65 | HR 84 | Temp 97.8°F | Ht 62.5 in | Wt 295.0 lb

## 2021-08-12 DIAGNOSIS — R5381 Other malaise: Secondary | ICD-10-CM

## 2021-08-12 DIAGNOSIS — K922 Gastrointestinal hemorrhage, unspecified: Secondary | ICD-10-CM | POA: Diagnosis not present

## 2021-08-12 DIAGNOSIS — R0902 Hypoxemia: Secondary | ICD-10-CM

## 2021-08-12 DIAGNOSIS — L309 Dermatitis, unspecified: Secondary | ICD-10-CM | POA: Diagnosis not present

## 2021-08-12 DIAGNOSIS — Z09 Encounter for follow-up examination after completed treatment for conditions other than malignant neoplasm: Secondary | ICD-10-CM | POA: Diagnosis not present

## 2021-08-12 DIAGNOSIS — R0602 Shortness of breath: Secondary | ICD-10-CM

## 2021-08-12 DIAGNOSIS — A048 Other specified bacterial intestinal infections: Secondary | ICD-10-CM

## 2021-08-12 DIAGNOSIS — D500 Iron deficiency anemia secondary to blood loss (chronic): Secondary | ICD-10-CM

## 2021-08-12 NOTE — Progress Notes (Signed)
Subjective: CC: Hospital follow-up for shortness of breath PCP: [name], DO [id number] Savannah Burns is a 71 y.o. female presenting to clinic today for:  1.  Shortness of breath/ GI bleed Patient was admitted to the hospital at [facility] for shortness of breath.  Her baseline O2 requirement is 2 L and she was up to 5 L at presentation.  She was found to be anemic with a hemoglobin level of 6.6.  She was transfused 2 units of packed red blood cells.  At discharge hemoglobin was up to 7.7.  She had endoscopy, enteroscopy and capsule study done.  She was discharged home with home health physical therapy, Occupational Therapy and oxygen and instructed to follow-up here for further evaluation of shortness of breath, O2 needs and repeat CBC.  Rybelsus was discontinued at discharge.  A1c was 5.4.  She was discharged on PPI and recommendations for outpatient GI follow-up.   Apparently there was an addendum to her chart recently which did noted that she was in fact H. pylori positive and that a voicemail was left to recommend that she pick up the antibiotic Pylera since quadruple therapy was not available at local pharmacy.  She reports that she was totally unaware that she had an antibiotic to pick up for the H. pylori but will get that done today.  She does not report any abdominal pain.  She is very relieved and appreciative of the care that she received at [facility] and intends to follow-up with them soon for hospital follow-up.  Does not report any frank blood in stool.  She is having some rash around her mouth that was previously evaluated by dermatology and she was told she had a fungal infection.  She is utilizing the medication that was prescribed but this has not helped at all.  She subsequently started using things like alcohol and hydrogen peroxide.  She is use vinegar etc.     ROS: Per HPI  Allergies  Allergen Reactions   Keflex [Cephalexin] Nausea And Vomiting   Past Medical History:   Diagnosis Date   Adrenal adenoma, left    Stable   Anxiety    Arthritis    bilateral hands   Depression    Diabetes mellitus, type 2 ([place]) 08/12/2008   Qualifier: Diagnosis of  By: Deborra Medina MD, [place]     Dyspnea    Esophageal varices (HCC)    Grade II diastolic dysfunction    History of kidney stones    Hyperlipidemia    Hypertension    Lower back pain    Lower GI bleed 03/19/2020   Panic attacks    Pneumonia    currently taking antibiotic and prednisone for early stages of pneumonia   Pulmonary nodules    bilateral   Skin cancer    face    Current Outpatient Medications:    bismuth-metronidazole-tetracycline (PYLERA) 140-125-125 MG capsule, Take by mouth., Disp: , Rfl:    Blood Glucose Calibration (GLUCOMETER DEX HIGH CONTROL) LIQD, Test CBG's once daily. , Disp: , Rfl:    Blood Glucose Monitoring Suppl (TRUE METRIX AIR GLUCOSE METER) w/Device KIT, Check BS daily Dx E11.9, Disp: 1 kit, Rfl: 0   Cholecalciferol (VITAMIN D) 50 MCG (2000 UT) tablet, Take 4,000 Units by mouth daily., Disp: , Rfl:    Cyanocobalamin (VITAMIN B-12 IJ), Inject 1,000 mcg as directed every 30 (thirty) days., Disp: , Rfl:    desvenlafaxine (PRISTIQ) 50 MG 24 hr tablet, Take 1 tablet (50 mg total)  by mouth daily., Disp: 90 tablet, Rfl: 3   esomeprazole (NEXIUM) 20 MG capsule, Take 1 capsule (20 mg total) by mouth daily at 12 noon., Disp: 90 capsule, Rfl: 3   ferrous sulfate 325 (65 FE) MG tablet, Take 1 tablet (325 mg total) by mouth 2 (two) times daily with a meal. (Patient taking differently: Take 325 mg by mouth daily with breakfast.), Disp: 60 tablet, Rfl: 1   furosemide (LASIX) 20 MG tablet, Take 20 mg by mouth. bid, Disp: , Rfl:    glucose blood (TRUE METRIX BLOOD GLUCOSE TEST) test strip, Check BS daily Dx E11.9, Disp: 100 each, Rfl: 3   lovastatin (MEVACOR) 20 MG tablet, Take 1 tablet (20 mg total) by mouth at bedtime., Disp: 90 tablet, Rfl: 3   oxyCODONE-acetaminophen (PERCOCET) 10-325 MG tablet,  Take 1 tablet by mouth every 6 (six) hours as needed for pain., Disp: , Rfl:    OXYGEN, Inhale 3 L into the lungs continuous., Disp: , Rfl:    potassium chloride SA (KLOR-CON) 20 MEQ tablet, Take 2 tablets (40 mEq total) by mouth 2 (two) times daily. (Patient taking differently: Take 40 mEq by mouth daily.), Disp: 360 tablet, Rfl: 3   Semaglutide (RYBELSUS) 3 MG TABS, Take 3 mg by mouth daily., Disp: 30 tablet, Rfl: 0   torsemide (DEMADEX) 20 MG tablet, Take 3 tablets (60 mg total) by mouth 2 (two) times daily. (Patient taking differently: Take 50 mg by mouth 2 (two) times daily.), Disp: 180 tablet, Rfl: 11   TRUEplus Lancets 30G MISC, Check BS daily Dx E11.9, Disp: 100 each, Rfl: 3 Social History   Socioeconomic History   Marital status: Widowed    Spouse name: Not on file   Number of children: 2   Years of education: 14   Highest education level: Not on file  Occupational History   Occupation: Retired   Tobacco Use   Smoking status: Former    Packs/day: 1.50    Years: 40.00    Pack years: 60.00    Types: Cigarettes    Quit date: 04/29/2015    Years since quitting: 6.2   Smokeless tobacco: Never   Tobacco comments:    Quit smoking 04/2015- Previous 1.5 ppd smoker  Vaping Use   Vaping Use: Never used  Substance and Sexual Activity   Alcohol use: No    Alcohol/week: 0.0 standard drinks   Drug use: No   Sexual activity: Not Currently    Birth control/protection: Post-menopausal  Other Topics Concern   Not on file  Social History Narrative   Her 43 year old granddaughter lives with her - one daughter lives nearby, but she doesn't have a good relationship with her. Has a great relationship with other daughter who lives 1.5 hrs away - talks to her daily on the phone.   Social Determinants of Health   Financial Resource Strain: Medium Risk   Difficulty of Paying Living Expenses: Somewhat hard  Food Insecurity: Food Insecurity Present   Worried About Running Out of Food in the Last  Year: Sometimes true   Ran Out of Food in the Last Year: Never true  Transportation Needs: No Transportation Needs   Lack of Transportation (Medical): No   Lack of Transportation (Non-Medical): No  Physical Activity: Inactive   Days of Exercise per Week: 0 days   Minutes of Exercise per Session: 0 min  Stress: Stress Concern Present   Feeling of Stress : To some extent  Social Connections: Socially Isolated  Frequency of Communication with Friends and Family: More than three times a week   Frequency of Social Gatherings with Friends and Family: Once a week   Attends Religious Services: Never   Marine scientist or Organizations: No   Attends Archivist Meetings: Never   Marital Status: Widowed  Human resources officer Violence: Not At Risk   Fear of Current or Ex-Partner: No   Emotionally Abused: No   Physically Abused: No   Sexually Abused: No   Family History  Problem Relation Age of Onset   Diabetes Father    Heart disease Father 73       MI   Hypertension Father    Anemia Mother        Transfusion dependent   COPD Sister    Cancer Paternal Grandmother 76       Pancreatic    Objective: Office vital signs reviewed. BP 126/65   Pulse 84   Temp 97.8 F (36.6 C)   Ht 5' 2.5" (1.588 m)   Wt 295 lb (133.8 kg)   SpO2 95% Comment: after sitting  BMI 53.10 kg/m   Physical Examination:  General: Awake, alert, morbidly obese, No acute distress HEENT: Sclera white.  No conjunctival pallor Cardio: regular rate and rhythm, S1S2 heard, soft systolic ejection murmurs appreciated Pulm: clear to auscultation bilaterally, no wheezes, rhonchi or rales; dyspnea after ambulation but after being allowed to rest she was breathing normally on 2 L by nasal cannula Extremities: warm, well perfused, lymphedema present to the legs with weeping of the right lower extremity. MSK: Antalgic, slow and unsteady gait, requiring rolling walker Skin: Perioral dermatitis noted without  cracking or bleeding  Ambulatory vital signs: Walking on RA: 85 %, HR 74 Sitting on RA: 87%, HR 84 Sitting on 2L via [place]: 95%, HR 84  Assessment/ Plan: 71 y.o. female   Shortness of breath - Plan: Ambulatory referral to Physical Therapy, Ambulatory referral to Pulmonology, CANCELED: CBC  Hypoxia - Plan: Ambulatory referral to Pulmonology, CANCELED: [facility] discharge follow-up  Acute GI bleeding - Plan: CANCELED: CBC  H. pylori infection - Plan: CANCELED: CBC  Physical deconditioning - Plan: Ambulatory referral to Physical Therapy  Facial dermatitis  Continues to have shortness of breath and requiring up to 4 L at home.  I think this is multifactorial including physical deconditioning, morbid obesity, anemia.  She certainly had fluid on her lower extremities today and again reiterated need for compliance with medications as outlined by her cardiologist.  New referral to pulmonology placed since we still have not really assessed her lung issues.  Referrals placed back in 2021 but it sounds like due to COVID that appointment never happened  We will allow her to get labs done tomorrow with her specialist so as to avoid duplication.  New referral placed to physical therapy outpatient as she did not feel comfortable sending into her home  We discussed skin care regimen for facial dermatitis which is refractory to multiple agents so far.  May need to consider following back up with dermatology  No orders of the defined types were placed in this encounter.  No orders of the defined types were placed in this encounter.   [name], DO [place] [phone]

## 2021-08-12 NOTE — Patient Instructions (Addendum)
Please remember to arrive to your appointment 15 minutes prior to your scheduled slot.  This allows [place] adequate time to check in so that you may be seen by Dr Lajuana Ripple.  When you are late, this lessens the amount of time you get to spend with your doctor and will likely result in you scheduling another appointment if you have multiple concerns.   If you are late to future visits I may be unable to accommodate your appointment and you may be asked to reschedule.   PICK UP THE MEDS for the H Pylori infection. This was sent in by the Hospital doctor  Acne Plan  Products: Face Wash:  Use a gentle cleanser, such as Cetaphil (generic version of this is fine). Something with Salicylic acid will help with those dry patches Moisturizer:  Use an "oil-free" moisturizer with SPF  Morning: Wash face, then completely dry Apply Moisturizer to entire face  Bedtime: Wash face, then completely dry Moisturize  Remember:  Use oil free soaps and lotions; these can be over the counter or store-brand Don't use harsh scrubs or astringents, these can make skin irritation and acne worse Moisturize daily with oil free lotion because the acne medicines will dry your skin  Call your doctor if you have: Lots of skin dryness or redness that doesn't get better if you use a moisturizer

## 2021-08-12 NOTE — Telephone Encounter (Signed)
-----   Message from [name], DO sent at 08/12/2021  2:23 PM EDT ----- Please let her know I placed a new referral to Pulm. If she has a preference for [place] [facility]/ [place], let courtney know.  Otherwise, I requested first available.  We placed referral back in 2021 but I dont see that it was ever carried out

## 2021-08-12 NOTE — Progress Notes (Signed)
lmtcb

## 2021-08-12 NOTE — Telephone Encounter (Signed)
Lmtcb.

## 2021-08-13 ENCOUNTER — Inpatient Hospital Stay (HOSPITAL_BASED_OUTPATIENT_CLINIC_OR_DEPARTMENT_OTHER): Payer: Medicare PPO

## 2021-08-13 ENCOUNTER — Encounter (HOSPITAL_COMMUNITY): Payer: Self-pay

## 2021-08-13 ENCOUNTER — Inpatient Hospital Stay (HOSPITAL_COMMUNITY): Payer: Medicare PPO

## 2021-08-13 VITALS — BP 155/77 | HR 80 | Temp 98.0°F | Resp 20

## 2021-08-13 DIAGNOSIS — E538 Deficiency of other specified B group vitamins: Secondary | ICD-10-CM | POA: Diagnosis not present

## 2021-08-13 DIAGNOSIS — I10 Essential (primary) hypertension: Secondary | ICD-10-CM | POA: Diagnosis not present

## 2021-08-13 DIAGNOSIS — R0609 Other forms of dyspnea: Secondary | ICD-10-CM | POA: Diagnosis not present

## 2021-08-13 DIAGNOSIS — D500 Iron deficiency anemia secondary to blood loss (chronic): Secondary | ICD-10-CM

## 2021-08-13 DIAGNOSIS — R5383 Other fatigue: Secondary | ICD-10-CM | POA: Diagnosis not present

## 2021-08-13 DIAGNOSIS — E785 Hyperlipidemia, unspecified: Secondary | ICD-10-CM | POA: Diagnosis not present

## 2021-08-13 DIAGNOSIS — D472 Monoclonal gammopathy: Secondary | ICD-10-CM

## 2021-08-13 DIAGNOSIS — C9000 Multiple myeloma not having achieved remission: Secondary | ICD-10-CM | POA: Diagnosis not present

## 2021-08-13 DIAGNOSIS — E119 Type 2 diabetes mellitus without complications: Secondary | ICD-10-CM | POA: Diagnosis not present

## 2021-08-13 DIAGNOSIS — R42 Dizziness and giddiness: Secondary | ICD-10-CM | POA: Diagnosis not present

## 2021-08-13 LAB — COMPREHENSIVE METABOLIC PANEL
ALT: 20 U/L (ref 0–44)
AST: 24 U/L (ref 15–41)
Albumin: 2.8 g/dL — ABNORMAL LOW (ref 3.5–5.0)
Alkaline Phosphatase: 90 U/L (ref 38–126)
Anion gap: 4 — ABNORMAL LOW (ref 5–15)
BUN: 14 mg/dL (ref 8–23)
CO2: 30 mmol/L (ref 22–32)
Calcium: 8.2 mg/dL — ABNORMAL LOW (ref 8.9–10.3)
Chloride: 104 mmol/L (ref 98–111)
Creatinine, Ser: 0.66 mg/dL (ref 0.44–1.00)
GFR, Estimated: >60 mL/min (ref >60)
Glucose, Bld: 211 mg/dL — ABNORMAL HIGH (ref 70–99)
Potassium: 3.9 mmol/L (ref 3.5–5.1)
Sodium: 138 mmol/L (ref 135–145)
Total Bilirubin: 0.3 mg/dL (ref 0.3–1.2)
Total Protein: 8.2 g/dL — ABNORMAL HIGH (ref 6.5–8.1)

## 2021-08-13 LAB — CBC
HCT: 27.1 % — ABNORMAL LOW (ref 36.0–46.0)
Hemoglobin: 7.8 g/dL — ABNORMAL LOW (ref 12.0–15.0)
MCH: 27.2 pg (ref 26.0–34.0)
MCHC: 28.8 g/dL — ABNORMAL LOW (ref 30.0–36.0)
MCV: 94.4 fL (ref 80.0–100.0)
Platelets: 133 10*3/uL — ABNORMAL LOW (ref 150–400)
RBC: 2.87 MIL/uL — ABNORMAL LOW (ref 3.87–5.11)
RDW: 17.3 % — ABNORMAL HIGH (ref 11.5–15.5)
WBC: 4 10*3/uL (ref 4.0–10.5)
nRBC: 0 % (ref 0.0–0.2)

## 2021-08-13 LAB — VITAMIN D 25 HYDROXY (VIT D DEFICIENCY, FRACTURES): Vit D, 25-Hydroxy: 34.67 ng/mL (ref 30–100)

## 2021-08-13 LAB — SAMPLE TO BLOOD BANK

## 2021-08-13 MED ORDER — CYANOCOBALAMIN 1000 MCG/ML IJ SOLN: 1000.0000 ug | Freq: Once | INTRAMUSCULAR | Status: DC

## 2021-08-13 NOTE — Progress Notes (Signed)
Patient presents today for possible blood products and B12 injection. HGB today 7.8. No blood products needed. Last B12 07/27/2021 per Ranell Patrick Pharmacist. Vital signs stable. No complaints at this time. Discharged from clinic by wheel chair in stable condition. Alert and oriented x 3. F/U with [facility] as scheduled.

## 2021-08-18 ENCOUNTER — Telehealth: Payer: Self-pay

## 2021-08-18 NOTE — Chronic Care Management (AMB) (Signed)
  Chronic Care Management Note  08/18/2021 Name: KHRISTIAN SEALS MRN: [id number] DOB: [birth year]  TAYIA STONESIFER is a 71 y.o. year old female who is a primary care patient of [name], DO and is actively engaged with the care management team. I reached out to [name] by phone today to assist with re-scheduling a follow up visit with the Licensed Clinical Social Worker  Follow up plan: Unsuccessful telephone outreach attempt made. A HIPAA compliant phone message was left for the patient providing contact information and requesting a return call.  The care management team will reach out to the patient again over the next 7 days.  If patient returns call to provider office, please advise to call [facility]  at [place], [place], [place], [place] [address] Direct Dial: [phone] [email or web address]: [email or web address]

## 2021-08-20 ENCOUNTER — Inpatient Hospital Stay (HOSPITAL_COMMUNITY): Payer: Medicare PPO

## 2021-08-20 VITALS — BP 130/74 | HR 83 | Temp 98.9°F | Resp 20

## 2021-08-20 DIAGNOSIS — R42 Dizziness and giddiness: Secondary | ICD-10-CM | POA: Diagnosis not present

## 2021-08-20 DIAGNOSIS — R5383 Other fatigue: Secondary | ICD-10-CM | POA: Diagnosis not present

## 2021-08-20 DIAGNOSIS — E119 Type 2 diabetes mellitus without complications: Secondary | ICD-10-CM | POA: Diagnosis not present

## 2021-08-20 DIAGNOSIS — R0609 Other forms of dyspnea: Secondary | ICD-10-CM | POA: Diagnosis not present

## 2021-08-20 DIAGNOSIS — E785 Hyperlipidemia, unspecified: Secondary | ICD-10-CM | POA: Diagnosis not present

## 2021-08-20 DIAGNOSIS — C9000 Multiple myeloma not having achieved remission: Secondary | ICD-10-CM | POA: Diagnosis not present

## 2021-08-20 DIAGNOSIS — D500 Iron deficiency anemia secondary to blood loss (chronic): Secondary | ICD-10-CM | POA: Diagnosis not present

## 2021-08-20 DIAGNOSIS — E538 Deficiency of other specified B group vitamins: Secondary | ICD-10-CM | POA: Diagnosis not present

## 2021-08-20 DIAGNOSIS — I10 Essential (primary) hypertension: Secondary | ICD-10-CM | POA: Diagnosis not present

## 2021-08-20 LAB — SAMPLE TO BLOOD BANK

## 2021-08-20 LAB — CBC
HCT: 27.6 % — ABNORMAL LOW (ref 36.0–46.0)
Hemoglobin: 8 g/dL — ABNORMAL LOW (ref 12.0–15.0)
MCH: 26.8 pg (ref 26.0–34.0)
MCHC: 29 g/dL — ABNORMAL LOW (ref 30.0–36.0)
MCV: 92.3 fL (ref 80.0–100.0)
Platelets: 146 10*3/uL — ABNORMAL LOW (ref 150–400)
RBC: 2.99 MIL/uL — ABNORMAL LOW (ref 3.87–5.11)
RDW: 17.6 % — ABNORMAL HIGH (ref 11.5–15.5)
WBC: 5 10*3/uL (ref 4.0–10.5)
nRBC: 0 % (ref 0.0–0.2)

## 2021-08-20 MED ORDER — SODIUM CHLORIDE 0.9 % IV SOLN: Freq: Once | INTRAVENOUS | Status: AC

## 2021-08-20 MED ORDER — SODIUM CHLORIDE 0.9 % IV SOLN
300.0000 mg | Freq: Once | INTRAVENOUS | Status: AC
  Administered 2021-08-20: 300 mg via INTRAVENOUS
  Filled 2021-08-20: qty 300

## 2021-08-20 MED ORDER — LORATADINE 10 MG PO TABS
10.0000 mg | ORAL_TABLET | Freq: Once | ORAL | Status: AC
  Administered 2021-08-20: 10 mg via ORAL
  Filled 2021-08-20: qty 1

## 2021-08-20 NOTE — Patient Instructions (Signed)
[facility]  Discharge Instructions: Thank you for choosing [facility] to provide your oncology and hematology care.  If you have a lab appointment with the [facility], please come in thru the Main Entrance and check in at the main information desk.  Wear comfortable clothing and clothing appropriate for easy access to any Portacath or PICC line.   We strive to give you quality time with your provider. You may need to reschedule your appointment if you arrive late (15 or more minutes).  Arriving late affects you and other patients whose appointments are after yours.  Also, if you miss three or more appointments without notifying the office, you may be dismissed from the clinic at the provider's discretion.      For prescription refill requests, have your pharmacy contact our office and allow 72 hours for refills to be completed.    Today you received the following chemotherapy and/or immunotherapy agents Venofer      To help prevent nausea and vomiting after your treatment, we encourage you to take your nausea medication as directed.  BELOW ARE SYMPTOMS THAT SHOULD BE REPORTED IMMEDIATELY: *FEVER GREATER THAN 100.4 F (38 C) OR HIGHER *CHILLS OR SWEATING *NAUSEA AND VOMITING THAT IS NOT CONTROLLED WITH YOUR NAUSEA MEDICATION *UNUSUAL SHORTNESS OF BREATH *UNUSUAL BRUISING OR BLEEDING *URINARY PROBLEMS (pain or burning when urinating, or frequent urination) *BOWEL PROBLEMS (unusual diarrhea, constipation, pain near the anus) TENDERNESS IN MOUTH AND THROAT WITH OR WITHOUT PRESENCE OF ULCERS (sore throat, sores in mouth, or a toothache) UNUSUAL RASH, SWELLING OR PAIN  UNUSUAL VAGINAL DISCHARGE OR ITCHING   Items with * indicate a potential emergency and should be followed up as soon as possible or go to the Emergency Department if any problems should occur.  Please show the CHEMOTHERAPY ALERT CARD or IMMUNOTHERAPY ALERT CARD at check-in to the Emergency  Department and triage nurse.  Should you have questions after your visit or need to cancel or reschedule your appointment, please contact [facility] [phone]  and follow the prompts.  Office hours are 8:00 a.m. to 4:30 p.m. Monday - Friday. Please note that voicemails left after 4:00 p.m. may not be returned until the following business day.  We are closed weekends and major holidays. You have access to a nurse at all times for urgent questions. Please call the main number to the clinic [phone] and follow the prompts.  For any non-urgent questions, you may also contact your provider using MyChart. We now offer e-Visits for anyone 68 and older to request care online for non-urgent symptoms. For details visit [email or web address].   Also download the MyChart app! Go to the app store, search "MyChart", open the app, select [facility], and log in with your MyChart username and password.  Due to Covid, a mask is required upon entering the hospital/clinic. If you do not have a mask, one will be given to you upon arrival. For doctor visits, patients may have 1 support person aged 47 or older with them. For treatment visits, patients cannot have anyone with them due to current Covid guidelines and our immunocompromised population.

## 2021-08-20 NOTE — Progress Notes (Signed)
Patient presents today for Venofer infusion per providers order.  Vital signs WNL.  Patient has no new complaints at this time.  Peripheral IV started and blood return noted pre and post infusion.  Venofer given today per MD orders.  Stable during infusion without adverse affects.  Vital signs stable.  No complaints at this time.  Discharge from clinic ambulatory in stable condition.  Alert and oriented X 3.  Follow up with [facility] as scheduled.

## 2021-08-26 ENCOUNTER — Telehealth: Payer: Self-pay | Admitting: Family Medicine

## 2021-08-26 NOTE — Telephone Encounter (Signed)
Please advise  Do not see where ozempic was sent

## 2021-08-26 NOTE — Chronic Care Management (AMB) (Signed)
  Chronic Care Management Note  08/26/2021 Name: Savannah Burns MRN: [id number] DOB: 1951-01-26  Savannah Burns is a 71 y.o. year old female who is a primary care patient of [name], DO and is actively engaged with the care management team. I reached out to [name] by phone today to assist with re-scheduling a follow up visit with the Licensed Clinical Social Worker  Follow up plan: Telephone appointment with care management team member scheduled for:09/07/2021  Noreene Larsson, [place], [place], [place] [address] Direct Dial: [phone] [email or web address]: [email or web address]

## 2021-08-27 ENCOUNTER — Telehealth (HOSPITAL_COMMUNITY): Payer: Self-pay

## 2021-08-27 ENCOUNTER — Other Ambulatory Visit: Payer: Self-pay | Admitting: Family Medicine

## 2021-08-27 ENCOUNTER — Inpatient Hospital Stay (HOSPITAL_COMMUNITY): Payer: Medicare PPO | Attending: Hematology

## 2021-08-27 ENCOUNTER — Inpatient Hospital Stay (HOSPITAL_COMMUNITY): Payer: Medicare PPO

## 2021-08-27 DIAGNOSIS — E1169 Type 2 diabetes mellitus with other specified complication: Secondary | ICD-10-CM

## 2021-08-27 DIAGNOSIS — D500 Iron deficiency anemia secondary to blood loss (chronic): Secondary | ICD-10-CM | POA: Insufficient documentation

## 2021-08-27 DIAGNOSIS — Z79899 Other long term (current) drug therapy: Secondary | ICD-10-CM | POA: Insufficient documentation

## 2021-08-27 DIAGNOSIS — E538 Deficiency of other specified B group vitamins: Secondary | ICD-10-CM | POA: Insufficient documentation

## 2021-08-27 LAB — SAMPLE TO BLOOD BANK

## 2021-08-27 LAB — CBC
HCT: 27.5 % — ABNORMAL LOW (ref 36.0–46.0)
Hemoglobin: 7.8 g/dL — ABNORMAL LOW (ref 12.0–15.0)
MCH: 27 pg (ref 26.0–34.0)
MCHC: 28.4 g/dL — ABNORMAL LOW (ref 30.0–36.0)
MCV: 95.2 fL (ref 80.0–100.0)
Platelets: 136 10*3/uL — ABNORMAL LOW (ref 150–400)
RBC: 2.89 MIL/uL — ABNORMAL LOW (ref 3.87–5.11)
RDW: 19.6 % — ABNORMAL HIGH (ref 11.5–15.5)
WBC: 4 10*3/uL (ref 4.0–10.5)
nRBC: 0 % (ref 0.0–0.2)

## 2021-08-27 MED ORDER — OZEMPIC (0.25 OR 0.5 MG/DOSE) 2 MG/3ML ~~LOC~~ SOPN: PEN_INJECTOR | SUBCUTANEOUS | 3 refills | Status: AC

## 2021-08-27 NOTE — Telephone Encounter (Signed)
Spoke with the patient on the phone with lab results Hgb 7.8 today.  Patient stated she felt good with no complaints voiced.  Patient stated she will follow up next Friday as scheduled.  Instructed the patient to go to the ER for any changes with understanding verbalized.

## 2021-08-27 NOTE — Progress Notes (Deleted)
Spoke with the patient on the phone.  Reviewed lab results Hgb 7.8 today.  Patient stated she felt good with no complaints voice.  Patient stated she will follow up next Friday as scheduled.  Instructed the patient to go to the ER for any changes with understanding verbalized.

## 2021-08-27 NOTE — Telephone Encounter (Signed)
sent 

## 2021-08-27 NOTE — Telephone Encounter (Signed)
Pt aware by voicemail

## 2021-09-01 ENCOUNTER — Other Ambulatory Visit: Payer: Self-pay | Admitting: *Deleted

## 2021-09-01 DIAGNOSIS — Z6841 Body Mass Index (BMI) 40.0 and over, adult: Secondary | ICD-10-CM | POA: Diagnosis not present

## 2021-09-01 DIAGNOSIS — E559 Vitamin D deficiency, unspecified: Secondary | ICD-10-CM | POA: Diagnosis not present

## 2021-09-01 DIAGNOSIS — R030 Elevated blood-pressure reading, without diagnosis of hypertension: Secondary | ICD-10-CM | POA: Diagnosis not present

## 2021-09-01 DIAGNOSIS — M5136 Other intervertebral disc degeneration, lumbar region: Secondary | ICD-10-CM | POA: Diagnosis not present

## 2021-09-01 DIAGNOSIS — G894 Chronic pain syndrome: Secondary | ICD-10-CM | POA: Diagnosis not present

## 2021-09-01 DIAGNOSIS — E119 Type 2 diabetes mellitus without complications: Secondary | ICD-10-CM | POA: Diagnosis not present

## 2021-09-01 DIAGNOSIS — Z79899 Other long term (current) drug therapy: Secondary | ICD-10-CM | POA: Diagnosis not present

## 2021-09-01 MED ORDER — BD SWAB SINGLE USE REGULAR PADS: MEDICATED_PAD | 3 refills | Status: DC

## 2021-09-01 MED ORDER — TRUE METRIX BLOOD GLUCOSE TEST VI STRP: ORAL_STRIP | 3 refills | Status: DC

## 2021-09-01 MED ORDER — TRUE METRIX AIR GLUCOSE METER W/DEVICE KIT: PACK | 0 refills | Status: DC

## 2021-09-01 MED ORDER — TRUEPLUS LANCETS 33G MISC: 3 refills | Status: DC

## 2021-09-01 MED ORDER — TRUE METRIX LEVEL 1 LOW VI SOLN: 0 refills | Status: DC

## 2021-09-03 ENCOUNTER — Inpatient Hospital Stay (HOSPITAL_COMMUNITY): Payer: Medicare PPO

## 2021-09-03 VITALS — BP 149/55 | HR 79 | Temp 97.6°F | Resp 20

## 2021-09-03 DIAGNOSIS — D500 Iron deficiency anemia secondary to blood loss (chronic): Secondary | ICD-10-CM

## 2021-09-03 DIAGNOSIS — E538 Deficiency of other specified B group vitamins: Secondary | ICD-10-CM | POA: Diagnosis not present

## 2021-09-03 DIAGNOSIS — D649 Anemia, unspecified: Secondary | ICD-10-CM

## 2021-09-03 DIAGNOSIS — Z79899 Other long term (current) drug therapy: Secondary | ICD-10-CM | POA: Diagnosis not present

## 2021-09-03 LAB — CBC
HCT: 24.3 % — ABNORMAL LOW (ref 36.0–46.0)
Hemoglobin: 6.9 g/dL — CL (ref 12.0–15.0)
MCH: 26.3 pg (ref 26.0–34.0)
MCHC: 28.4 g/dL — ABNORMAL LOW (ref 30.0–36.0)
MCV: 92.7 fL (ref 80.0–100.0)
Platelets: 107 10*3/uL — ABNORMAL LOW (ref 150–400)
RBC: 2.62 MIL/uL — ABNORMAL LOW (ref 3.87–5.11)
RDW: 18.9 % — ABNORMAL HIGH (ref 11.5–15.5)
WBC: 5.3 10*3/uL (ref 4.0–10.5)
nRBC: 0 % (ref 0.0–0.2)

## 2021-09-03 LAB — SAMPLE TO BLOOD BANK

## 2021-09-03 LAB — PREPARE RBC (CROSSMATCH)

## 2021-09-03 MED ORDER — SODIUM CHLORIDE 0.9% IV SOLUTION
250.0000 mL | Freq: Once | INTRAVENOUS | Status: AC
  Administered 2021-09-03: 250 mL via INTRAVENOUS

## 2021-09-03 MED ORDER — CYANOCOBALAMIN 1000 MCG/ML IJ SOLN
1000.0000 ug | Freq: Once | INTRAMUSCULAR | Status: AC
  Administered 2021-09-03: 1000 ug via INTRAMUSCULAR
  Filled 2021-09-03: qty 1

## 2021-09-03 MED ORDER — SODIUM CHLORIDE 0.9 % IV SOLN
300.0000 mg | Freq: Once | INTRAVENOUS | Status: AC
  Administered 2021-09-03: 300 mg via INTRAVENOUS
  Filled 2021-09-03: qty 300

## 2021-09-03 MED ORDER — LORATADINE 10 MG PO TABS: 10.0000 mg | ORAL_TABLET | Freq: Once | ORAL | Status: DC

## 2021-09-03 MED ORDER — DIPHENHYDRAMINE HCL 25 MG PO CAPS
25.0000 mg | ORAL_CAPSULE | Freq: Once | ORAL | Status: AC
  Administered 2021-09-03: 25 mg via ORAL
  Filled 2021-09-03: qty 1

## 2021-09-03 MED ORDER — ACETAMINOPHEN 325 MG PO TABS
650.0000 mg | ORAL_TABLET | Freq: Once | ORAL | Status: AC
  Administered 2021-09-03: 650 mg via ORAL
  Filled 2021-09-03: qty 2

## 2021-09-03 MED ORDER — SODIUM CHLORIDE 0.9 % IV SOLN: Freq: Once | INTRAVENOUS | Status: AC

## 2021-09-03 NOTE — Progress Notes (Signed)
CRITICAL VALUE STICKER  CRITICAL VALUE: hgb 6.9  RECEIVER (on-site recipient of call): B. Allean Found, [place] NOTIFIED: 09/03/2021 at 0905  MESSENGER (representative from lab):Lab tech  MD NOTIFIED: Delton Coombes  TIME OF NOTIFICATION:0905  RESPONSE:  Transfuse one unit of blood per policy. Lab called and notified of orders.

## 2021-09-03 NOTE — Patient Instructions (Signed)
[facility]  Discharge Instructions: Thank you for choosing [facility] to provide your oncology and hematology care.  If you have a lab appointment with the [facility], please come in thru the Main Entrance and check in at the main information desk.  Wear comfortable clothing and clothing appropriate for easy access to any Portacath or PICC line.   We strive to give you quality time with your provider. You may need to reschedule your appointment if you arrive late (15 or more minutes).  Arriving late affects you and other patients whose appointments are after yours.  Also, if you miss three or more appointments without notifying the office, you may be dismissed from the clinic at the provider's discretion.      For prescription refill requests, have your pharmacy contact our office and allow 72 hours for refills to be completed.    Today you received the following chemotherapy and/or immunotherapy agents Venofer and 1 UPRBC      To help prevent nausea and vomiting after your treatment, we encourage you to take your nausea medication as directed.  BELOW ARE SYMPTOMS THAT SHOULD BE REPORTED IMMEDIATELY: *FEVER GREATER THAN 100.4 F (38 C) OR HIGHER *CHILLS OR SWEATING *NAUSEA AND VOMITING THAT IS NOT CONTROLLED WITH YOUR NAUSEA MEDICATION *UNUSUAL SHORTNESS OF BREATH *UNUSUAL BRUISING OR BLEEDING *URINARY PROBLEMS (pain or burning when urinating, or frequent urination) *BOWEL PROBLEMS (unusual diarrhea, constipation, pain near the anus) TENDERNESS IN MOUTH AND THROAT WITH OR WITHOUT PRESENCE OF ULCERS (sore throat, sores in mouth, or a toothache) UNUSUAL RASH, SWELLING OR PAIN  UNUSUAL VAGINAL DISCHARGE OR ITCHING   Items with * indicate a potential emergency and should be followed up as soon as possible or go to the Emergency Department if any problems should occur.  Please show the CHEMOTHERAPY ALERT CARD or IMMUNOTHERAPY ALERT CARD at check-in to the  Emergency Department and triage nurse.  Should you have questions after your visit or need to cancel or reschedule your appointment, please contact [facility] [phone]  and follow the prompts.  Office hours are 8:00 a.m. to 4:30 p.m. Monday - Friday. Please note that voicemails left after 4:00 p.m. may not be returned until the following business day.  We are closed weekends and major holidays. You have access to a nurse at all times for urgent questions. Please call the main number to the clinic [phone] and follow the prompts.  For any non-urgent questions, you may also contact your provider using MyChart. We now offer e-Visits for anyone 43 and older to request care online for non-urgent symptoms. For details visit [email or web address].   Also download the MyChart app! Go to the app store, search "MyChart", open the app, select [facility], and log in with your MyChart username and password.  Due to Covid, a mask is required upon entering the hospital/clinic. If you do not have a mask, one will be given to you upon arrival. For doctor visits, patients may have 1 support person aged 5 or older with them. For treatment visits, patients cannot have anyone with them due to current Covid guidelines and our immunocompromised population.

## 2021-09-03 NOTE — Progress Notes (Signed)
Patient presents today for Venofer and 1 UPRBC per providers order.  Vital signs WNL  Patient has no new complaints at this time.  Patient to pre medication at home.  Peripheral IV started and blood return noted pre and post infusion.  Venofer and 1 UPRBC given today per MD orders.  Stable during infusion without adverse affects.  Vital signs stable.  No complaints at this time.  Discharge from clinic via wheelchair in stable condition.  Alert and oriented X 3.  Follow up with [facility] as scheduled.

## 2021-09-04 LAB — TYPE AND SCREEN
ABO/RH(D): A POS
Antibody Screen: NEGATIVE
Unit division: 0

## 2021-09-04 LAB — BPAM RBC
Blood Product Expiration Date: 202307062359
ISSUE DATE / TIME: 202306091144
Unit Type and Rh: 6200

## 2021-09-07 ENCOUNTER — Telehealth: Payer: Medicare PPO | Admitting: Licensed Clinical Social Worker

## 2021-09-07 ENCOUNTER — Telehealth: Payer: Self-pay | Admitting: Licensed Clinical Social Worker

## 2021-09-07 NOTE — Telephone Encounter (Addendum)
  Chronic Care Management     Clinical Social Work Note   09/07/21   Name: Savannah Burns     MRN: [id number]       DOB: [birth year]   Savannah Burns is a 71 y.o. year old female who is a primary care patient of [name], DO. The CCM team was consulted to assist the patient with chronic disease management and/or care coordination needs related to: [employer] .   LCSW called client phone number today but LCSW was not able to speak via phone with client. LCSW did leave phone message for [name] asking her to please call LCSW at [phone].  Follow Up Plan:  LCSW to ask Care Guide scheduler to please re-schedule LCSW phone visit with client  [name].Briel Gallicchio MSW, [place] Holiday representative [facility] Care Management [phone]

## 2021-09-08 ENCOUNTER — Ambulatory Visit: Payer: Medicare PPO

## 2021-09-08 NOTE — Therapy (Signed)
Patient presented to her appointment reporting that her hemoglobin had been dropping in recent weeks despite her blood transfusions. Recommend that she receive clearance from her specialist prior to beginning physical therapy. She agreed with this recommendation and reported that she would call the clinic to provide [place] an update on 6/19.  Jacqulynn Cadet, PT, DPT

## 2021-09-09 IMAGING — CT CT BIOPSY
1 of 2 series · 15 of 29 positions shown, 19 images · non-contrast
Comparison: none

INDICATION: 69-year-old female with MGUS and concern for multiple myeloma.

[Series 2: i-spiral 5.0 br40 · axial · 0.74mm/px · z∈[+1344,+1470]mm · 15 of 40 slices shown, 19 images]
[im 2/40  mediastinal]
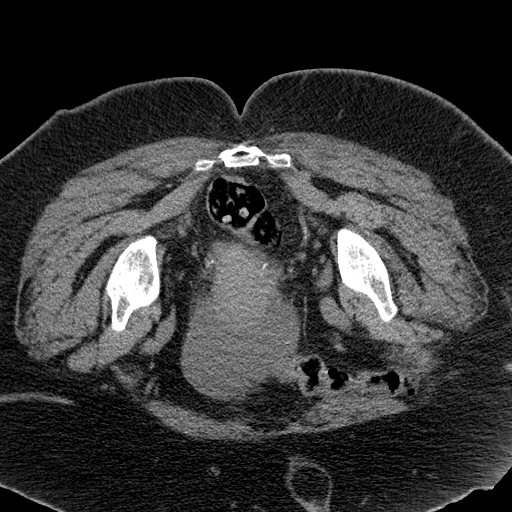
[im 2/40  lung]
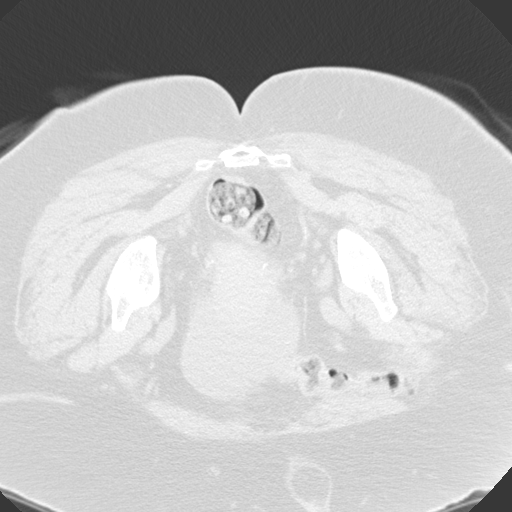
[im 5/40  lung]
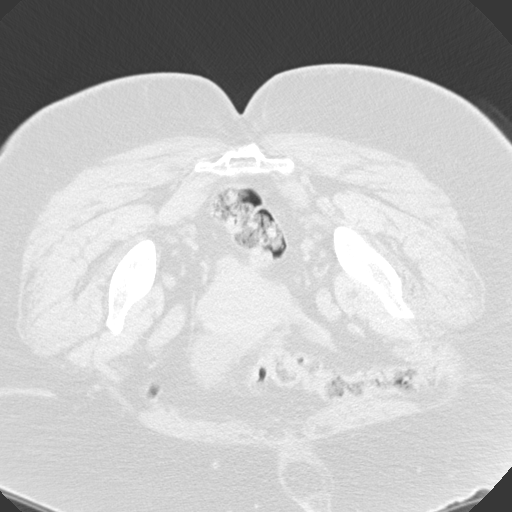
[im 8/40  lung]
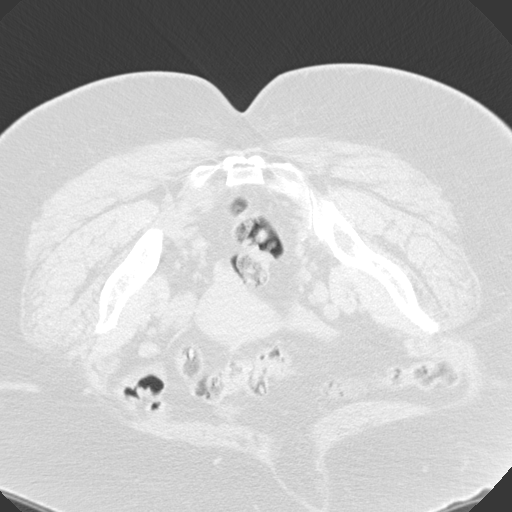
[im 10/40  lung]
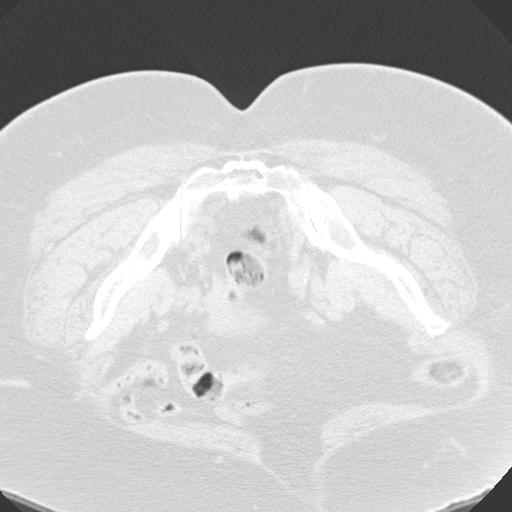
[im 13/40  mediastinal]
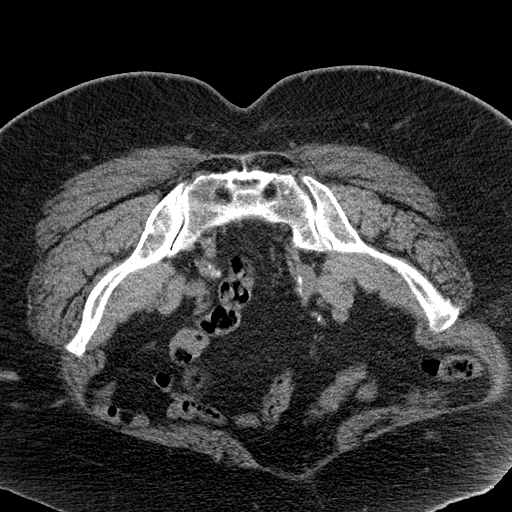
[im 13/40  lung]
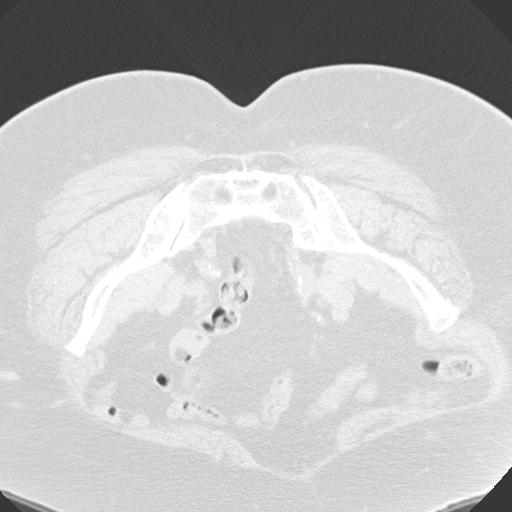
[im 16/40  lung]
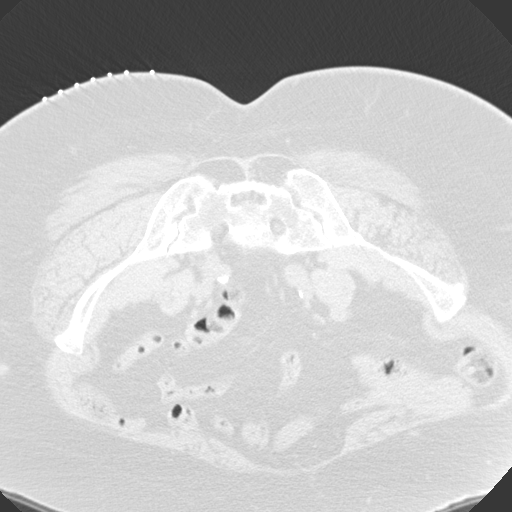
[im 18/40  lung]
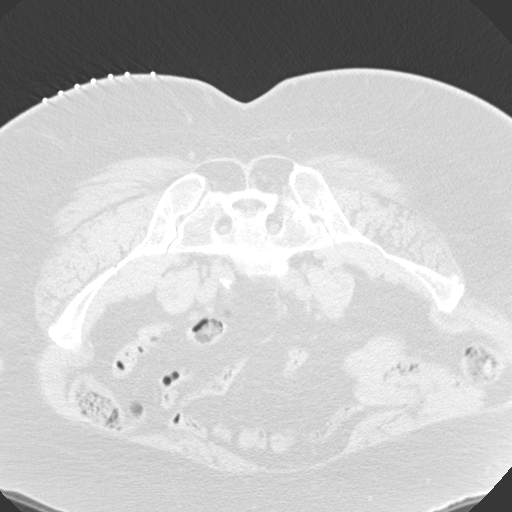
[im 20/40  lung]
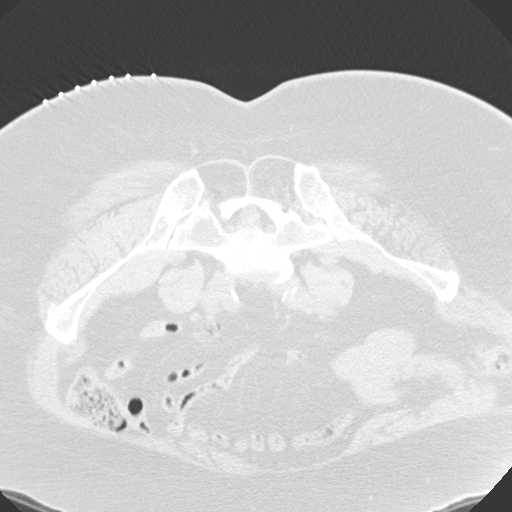
[im 22/40  mediastinal]
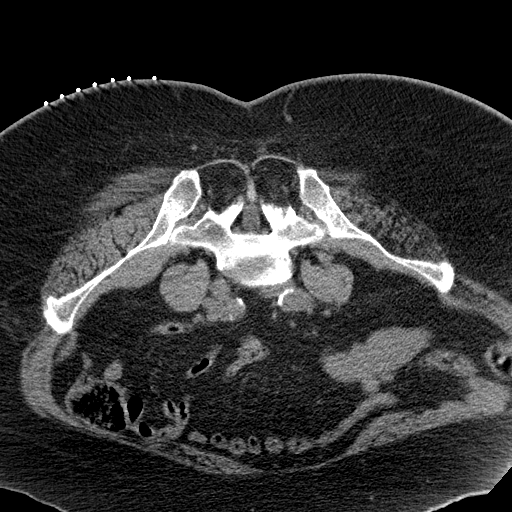
[im 22/40  lung]
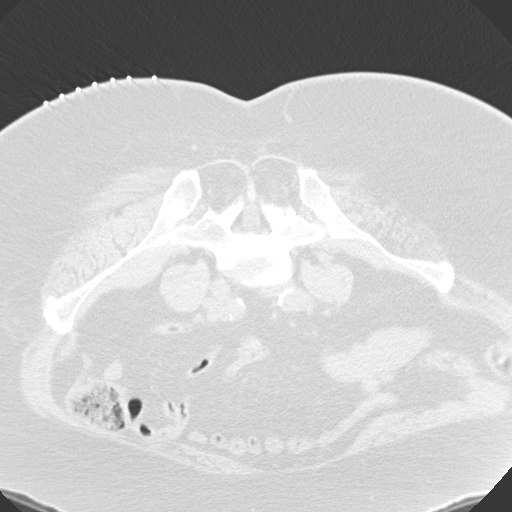
[im 24/40  lung]
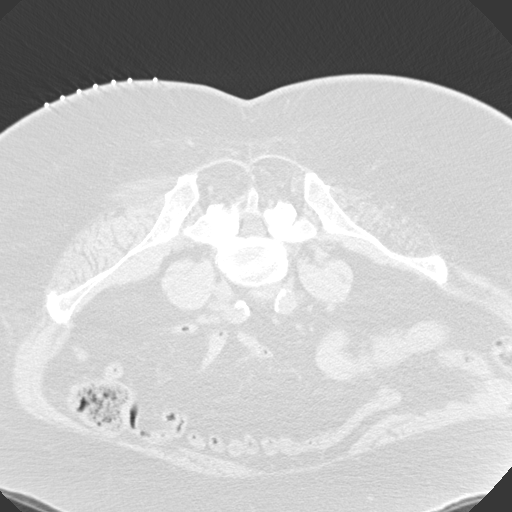
[im 27/40  lung]
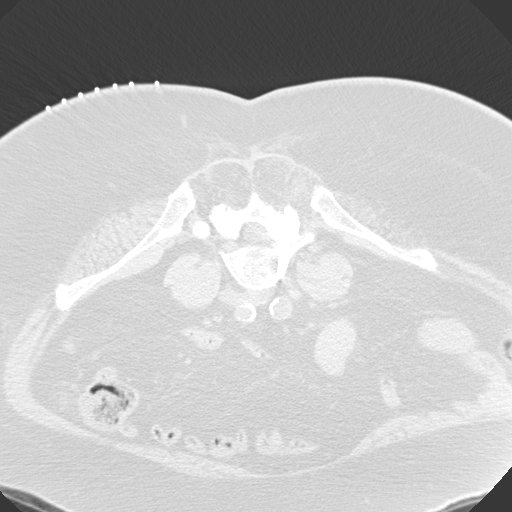
[im 30/40  lung]
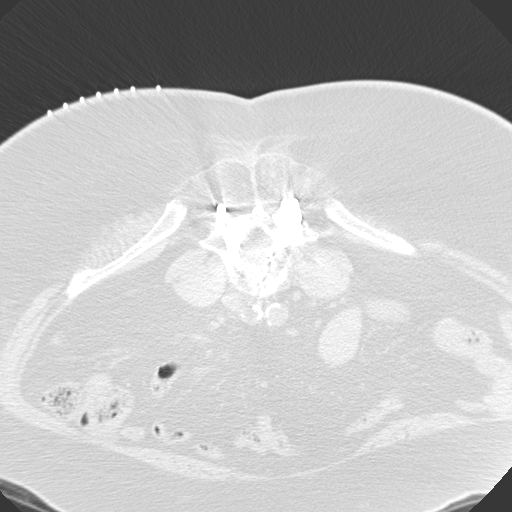
[im 32/40  mediastinal]
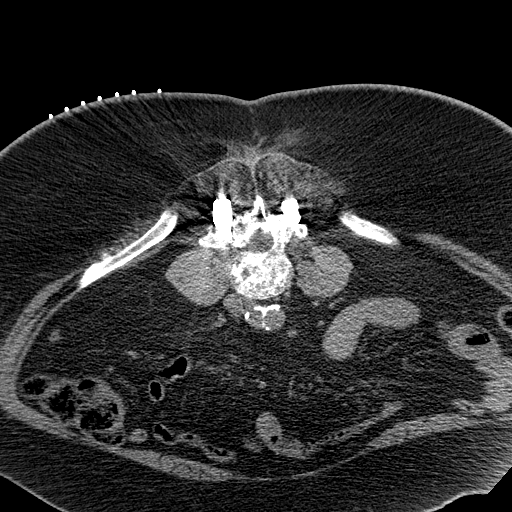
[im 32/40  lung]
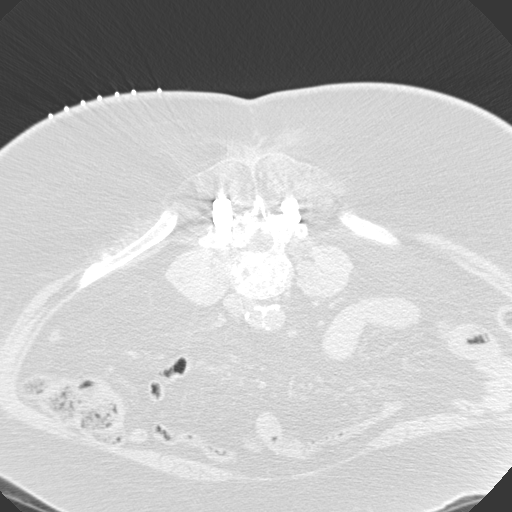
[im 35/40  lung]
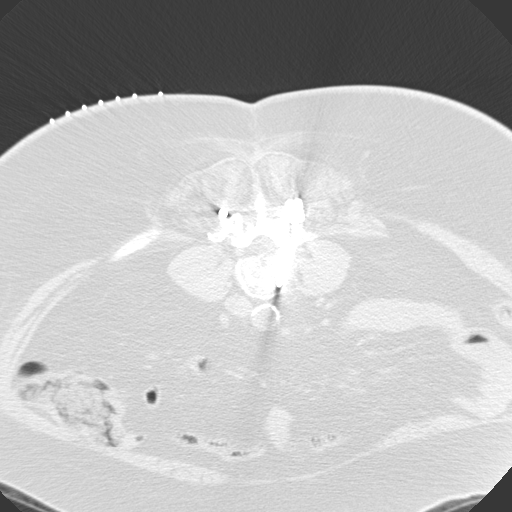
[im 38/40  lung]
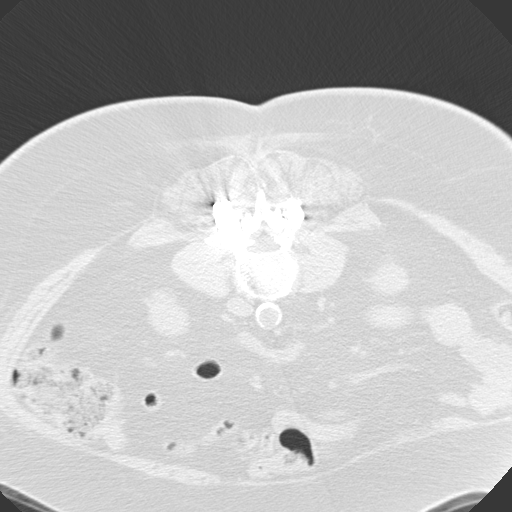

[15 of 29 positions shown; findings below may reference images not displayed]

EXAM:
CT GUIDED BONE MARROW ASPIRATION AND CORE BIOPSY

MEDICATIONS:
None.

ANESTHESIA/SEDATION:
Moderate (conscious) sedation was employed during this procedure. A
total of 3 milligrams versed and 100 micrograms fentanyl were
administered intravenously. The patient's level of consciousness and
vital signs were monitored continuously by radiology nursing
throughout the procedure under my direct supervision.

Total monitored sedation time: 14 minutes

FLUOROSCOPY TIME:  None.

COMPLICATIONS:
None immediate.

Estimated blood loss: <25 mL

PROCEDURE:
Informed written consent was obtained from the patient after a
thorough discussion of the procedural risks, benefits and
alternatives. All questions were addressed. Maximal Sterile Barrier
Technique was utilized including caps, mask, sterile gowns, sterile
gloves, sterile drape, hand hygiene and skin antiseptic. A timeout
was performed prior to the initiation of the procedure.

The patient was positioned prone and non-contrast localization CT
was performed of the pelvis to demonstrate the iliac marrow spaces.

Maximal barrier sterile technique utilized including caps, mask,
sterile gowns, sterile gloves, large sterile drape, hand hygiene,
and betadine prep.

Under sterile conditions and local anesthesia, an 11 gauge coaxial
bone biopsy needle was advanced into the right iliac marrow space.
Needle position was confirmed with CT imaging. Initially, bone
marrow aspiration was performed. Next, the 11 gauge outer cannula
was utilized to obtain a right iliac bone marrow core biopsy. Needle
was removed. Hemostasis was obtained with compression. The patient
tolerated the procedure well. Samples were prepared with the
cytotechnologist.
IMPRESSION: Technically successful CT-guided core biopsy and aspiration of the
right iliac bone marrow.

## 2021-09-10 ENCOUNTER — Inpatient Hospital Stay (HOSPITAL_COMMUNITY): Payer: Medicare PPO

## 2021-09-10 DIAGNOSIS — Z79899 Other long term (current) drug therapy: Secondary | ICD-10-CM | POA: Diagnosis not present

## 2021-09-10 DIAGNOSIS — D500 Iron deficiency anemia secondary to blood loss (chronic): Secondary | ICD-10-CM | POA: Diagnosis not present

## 2021-09-10 DIAGNOSIS — E538 Deficiency of other specified B group vitamins: Secondary | ICD-10-CM | POA: Diagnosis not present

## 2021-09-10 LAB — CBC
HCT: 32.1 % — ABNORMAL LOW (ref 36.0–46.0)
Hemoglobin: 9.2 g/dL — ABNORMAL LOW (ref 12.0–15.0)
MCH: 26.5 pg (ref 26.0–34.0)
MCHC: 28.7 g/dL — ABNORMAL LOW (ref 30.0–36.0)
MCV: 92.5 fL (ref 80.0–100.0)
Platelets: 125 10*3/uL — ABNORMAL LOW (ref 150–400)
RBC: 3.47 MIL/uL — ABNORMAL LOW (ref 3.87–5.11)
RDW: 19.8 % — ABNORMAL HIGH (ref 11.5–15.5)
WBC: 4.5 10*3/uL (ref 4.0–10.5)
nRBC: 0 % (ref 0.0–0.2)

## 2021-09-10 LAB — SAMPLE TO BLOOD BANK

## 2021-09-10 NOTE — Progress Notes (Signed)
Hemoglobin 9.2 today. No blood needed . Follow up as scheduled

## 2021-09-10 NOTE — Progress Notes (Signed)
Hemoglobin 9.2 today. No blood needed. Follow up as scheduled

## 2021-09-17 ENCOUNTER — Inpatient Hospital Stay (HOSPITAL_COMMUNITY): Payer: Medicare PPO

## 2021-09-17 VITALS — BP 167/89 | HR 73 | Temp 98.1°F | Resp 20

## 2021-09-17 DIAGNOSIS — D500 Iron deficiency anemia secondary to blood loss (chronic): Secondary | ICD-10-CM | POA: Diagnosis not present

## 2021-09-17 DIAGNOSIS — Z79899 Other long term (current) drug therapy: Secondary | ICD-10-CM | POA: Diagnosis not present

## 2021-09-17 DIAGNOSIS — E538 Deficiency of other specified B group vitamins: Secondary | ICD-10-CM | POA: Diagnosis not present

## 2021-09-17 LAB — CBC
HCT: 30.7 % — ABNORMAL LOW (ref 36.0–46.0)
Hemoglobin: 9 g/dL — ABNORMAL LOW (ref 12.0–15.0)
MCH: 26.7 pg (ref 26.0–34.0)
MCHC: 29.3 g/dL — ABNORMAL LOW (ref 30.0–36.0)
MCV: 91.1 fL (ref 80.0–100.0)
Platelets: 157 10*3/uL (ref 150–400)
RBC: 3.37 MIL/uL — ABNORMAL LOW (ref 3.87–5.11)
RDW: 19 % — ABNORMAL HIGH (ref 11.5–15.5)
WBC: 4.3 10*3/uL (ref 4.0–10.5)
nRBC: 0 % (ref 0.0–0.2)

## 2021-09-17 LAB — SAMPLE TO BLOOD BANK

## 2021-09-17 LAB — IRON AND TIBC
Iron: 31 ug/dL (ref 28–170)
Saturation Ratios: 8 % — ABNORMAL LOW (ref 10.4–31.8)
TIBC: 382 ug/dL (ref 250–450)
UIBC: 351 ug/dL

## 2021-09-17 LAB — FERRITIN: Ferritin: 28 ng/mL (ref 11–307)

## 2021-09-17 MED ORDER — SODIUM CHLORIDE 0.9 % IV SOLN: Freq: Once | INTRAVENOUS | Status: AC

## 2021-09-17 MED ORDER — LORATADINE 10 MG PO TABS
10.0000 mg | ORAL_TABLET | Freq: Once | ORAL | Status: AC
  Administered 2021-09-17: 10 mg via ORAL
  Filled 2021-09-17: qty 1

## 2021-09-17 MED ORDER — SODIUM CHLORIDE 0.9 % IV SOLN
300.0000 mg | Freq: Once | INTRAVENOUS | Status: AC
  Administered 2021-09-17: 300 mg via INTRAVENOUS
  Filled 2021-09-17: qty 300

## 2021-09-22 ENCOUNTER — Telehealth: Payer: Self-pay | Admitting: Licensed Clinical Social Worker

## 2021-09-22 ENCOUNTER — Telehealth: Payer: Self-pay

## 2021-09-22 NOTE — Telephone Encounter (Addendum)
  Chronic Care Management     Clinical Social Work Note   09/22/21   Name: Savannah Burns     MRN: [id number]       DOB: 07-28-1950   Savannah Burns is a 71 y.o. year old female who is a primary care patient of [name], DO. The CCM team was consulted to assist the patient with chronic disease management and/or care coordination needs related to: [employer] .   LCSW received communication today from [place] . Amber reported to [employer] today that she had a phone call recently with [name] to re-schedule LCSW phone visit with client. Client declined to re-schedule phone visit with LCSW.  LCSW will discharge client today from CCM services due to client not willing to communicate with care provider staff.   Follow Up Plan:  N/A.  Client discharged today from [facility] program  [name].Glorimar Stroope MSW, [place] Holiday representative [facility] Care Management [phone]

## 2021-09-22 NOTE — Chronic Care Management (AMB) (Signed)
  Chronic Care Management Note  09/22/2021 Name: REESE SENK MRN: [id number] DOB: 08-19-1950  Savannah Burns is a 71 y.o. year old female who is a primary care patient of [name], DO and is actively engaged with the care management team. I reached out to [name] by phone today to assist with re-scheduling a follow up visit with the Licensed Clinical Social Worker  Follow up plan: Patient declines further follow up and engagement by the care management team. Appropriate care team members and provider have been notified via electronic communication.   Noreene Larsson, [place], [place], [place] [address] Direct Dial: [phone] [email or web address]

## 2021-09-24 ENCOUNTER — Inpatient Hospital Stay (HOSPITAL_COMMUNITY): Payer: Medicare PPO

## 2021-09-24 DIAGNOSIS — D500 Iron deficiency anemia secondary to blood loss (chronic): Secondary | ICD-10-CM

## 2021-09-24 DIAGNOSIS — E538 Deficiency of other specified B group vitamins: Secondary | ICD-10-CM | POA: Diagnosis not present

## 2021-09-24 DIAGNOSIS — Z79899 Other long term (current) drug therapy: Secondary | ICD-10-CM | POA: Diagnosis not present

## 2021-09-24 LAB — CBC
HCT: 32.6 % — ABNORMAL LOW (ref 36.0–46.0)
Hemoglobin: 9.3 g/dL — ABNORMAL LOW (ref 12.0–15.0)
MCH: 26.7 pg (ref 26.0–34.0)
MCHC: 28.5 g/dL — ABNORMAL LOW (ref 30.0–36.0)
MCV: 93.7 fL (ref 80.0–100.0)
Platelets: 121 10*3/uL — ABNORMAL LOW (ref 150–400)
RBC: 3.48 MIL/uL — ABNORMAL LOW (ref 3.87–5.11)
RDW: 19.9 % — ABNORMAL HIGH (ref 11.5–15.5)
WBC: 3.9 10*3/uL — ABNORMAL LOW (ref 4.0–10.5)
nRBC: 0 % (ref 0.0–0.2)

## 2021-09-24 LAB — SAMPLE TO BLOOD BANK

## 2021-09-24 NOTE — Progress Notes (Signed)
Hgb 9.3 today.  Patient given copy and discharged with no complaints voiced.  No s/s of distress noted.

## 2021-09-29 ENCOUNTER — Telehealth: Payer: Self-pay | Admitting: Family Medicine

## 2021-09-29 NOTE — Telephone Encounter (Signed)
Pt was seen on 08/12/21. I have a sent comm mssg to [employer], but responses are not kept but for a short period of time, waiting on a reply

## 2021-10-01 ENCOUNTER — Other Ambulatory Visit (HOSPITAL_COMMUNITY): Payer: Medicare PPO

## 2021-10-01 ENCOUNTER — Ambulatory Visit (HOSPITAL_COMMUNITY): Payer: Medicare PPO

## 2021-10-04 DIAGNOSIS — R030 Elevated blood-pressure reading, without diagnosis of hypertension: Secondary | ICD-10-CM | POA: Diagnosis not present

## 2021-10-04 DIAGNOSIS — G894 Chronic pain syndrome: Secondary | ICD-10-CM | POA: Diagnosis not present

## 2021-10-04 DIAGNOSIS — E119 Type 2 diabetes mellitus without complications: Secondary | ICD-10-CM | POA: Diagnosis not present

## 2021-10-04 DIAGNOSIS — M5136 Other intervertebral disc degeneration, lumbar region: Secondary | ICD-10-CM | POA: Diagnosis not present

## 2021-10-04 DIAGNOSIS — Z6841 Body Mass Index (BMI) 40.0 and over, adult: Secondary | ICD-10-CM | POA: Diagnosis not present

## 2021-10-04 DIAGNOSIS — F1120 Opioid dependence, uncomplicated: Secondary | ICD-10-CM | POA: Diagnosis not present

## 2021-10-04 DIAGNOSIS — Z79899 Other long term (current) drug therapy: Secondary | ICD-10-CM | POA: Diagnosis not present

## 2021-10-06 DIAGNOSIS — Z79899 Other long term (current) drug therapy: Secondary | ICD-10-CM | POA: Diagnosis not present

## 2021-10-08 ENCOUNTER — Inpatient Hospital Stay (HOSPITAL_COMMUNITY): Payer: Medicare PPO

## 2021-10-08 ENCOUNTER — Inpatient Hospital Stay (HOSPITAL_COMMUNITY): Payer: Medicare PPO | Attending: Hematology

## 2021-10-08 DIAGNOSIS — D500 Iron deficiency anemia secondary to blood loss (chronic): Secondary | ICD-10-CM

## 2021-10-08 DIAGNOSIS — E538 Deficiency of other specified B group vitamins: Secondary | ICD-10-CM | POA: Insufficient documentation

## 2021-10-08 DIAGNOSIS — Z79899 Other long term (current) drug therapy: Secondary | ICD-10-CM | POA: Diagnosis not present

## 2021-10-08 LAB — CBC
HCT: 34.3 % — ABNORMAL LOW (ref 36.0–46.0)
Hemoglobin: 10.4 g/dL — ABNORMAL LOW (ref 12.0–15.0)
MCH: 27.8 pg (ref 26.0–34.0)
MCHC: 30.3 g/dL (ref 30.0–36.0)
MCV: 91.7 fL (ref 80.0–100.0)
Platelets: 150 10*3/uL (ref 150–400)
RBC: 3.74 MIL/uL — ABNORMAL LOW (ref 3.87–5.11)
RDW: 20.4 % — ABNORMAL HIGH (ref 11.5–15.5)
WBC: 5.6 10*3/uL (ref 4.0–10.5)
nRBC: 0 % (ref 0.0–0.2)

## 2021-10-08 LAB — SAMPLE TO BLOOD BANK

## 2021-10-08 NOTE — Progress Notes (Signed)
No blood today per parameters for HGB of 10.4.

## 2021-10-15 ENCOUNTER — Inpatient Hospital Stay (HOSPITAL_COMMUNITY): Payer: Medicare PPO

## 2021-10-15 ENCOUNTER — Encounter (HOSPITAL_COMMUNITY): Payer: Self-pay

## 2021-10-15 VITALS — BP 159/58 | HR 73 | Temp 97.6°F | Resp 18

## 2021-10-15 DIAGNOSIS — E538 Deficiency of other specified B group vitamins: Secondary | ICD-10-CM | POA: Diagnosis not present

## 2021-10-15 DIAGNOSIS — D500 Iron deficiency anemia secondary to blood loss (chronic): Secondary | ICD-10-CM

## 2021-10-15 DIAGNOSIS — Z79899 Other long term (current) drug therapy: Secondary | ICD-10-CM | POA: Diagnosis not present

## 2021-10-15 LAB — CBC WITH DIFFERENTIAL/PLATELET
Abs Immature Granulocytes: 0.02 10*3/uL (ref 0.00–0.07)
Basophils Absolute: 0 10*3/uL (ref 0.0–0.1)
Basophils Relative: 0 %
Eosinophils Absolute: 0 10*3/uL (ref 0.0–0.5)
Eosinophils Relative: 1 %
HCT: 34.1 % — ABNORMAL LOW (ref 36.0–46.0)
Hemoglobin: 10.3 g/dL — ABNORMAL LOW (ref 12.0–15.0)
Immature Granulocytes: 0 %
Lymphocytes Relative: 24 %
Lymphs Abs: 1.1 10*3/uL (ref 0.7–4.0)
MCH: 28.1 pg (ref 26.0–34.0)
MCHC: 30.2 g/dL (ref 30.0–36.0)
MCV: 93.2 fL (ref 80.0–100.0)
Monocytes Absolute: 0.4 10*3/uL (ref 0.1–1.0)
Monocytes Relative: 9 %
Neutro Abs: 2.9 10*3/uL (ref 1.7–7.7)
Neutrophils Relative %: 66 %
Platelets: 125 10*3/uL — ABNORMAL LOW (ref 150–400)
RBC: 3.66 MIL/uL — ABNORMAL LOW (ref 3.87–5.11)
RDW: 20.4 % — ABNORMAL HIGH (ref 11.5–15.5)
WBC: 4.5 10*3/uL (ref 4.0–10.5)
nRBC: 0 % (ref 0.0–0.2)

## 2021-10-15 LAB — SAMPLE TO BLOOD BANK

## 2021-10-15 MED ORDER — SODIUM CHLORIDE 0.9 % IV SOLN: Freq: Once | INTRAVENOUS | Status: AC

## 2021-10-15 MED ORDER — CYANOCOBALAMIN 1000 MCG/ML IJ SOLN
1000.0000 ug | Freq: Once | INTRAMUSCULAR | Status: AC
  Administered 2021-10-15: 1000 ug via INTRAMUSCULAR
  Filled 2021-10-15: qty 1

## 2021-10-15 MED ORDER — SODIUM CHLORIDE 0.9 % IV SOLN
300.0000 mg | Freq: Once | INTRAVENOUS | Status: AC
  Administered 2021-10-15: 300 mg via INTRAVENOUS
  Filled 2021-10-15: qty 300

## 2021-10-15 MED ORDER — LORATADINE 10 MG PO TABS
10.0000 mg | ORAL_TABLET | Freq: Once | ORAL | Status: AC
  Administered 2021-10-15: 10 mg via ORAL
  Filled 2021-10-15: qty 1

## 2021-10-15 NOTE — Progress Notes (Signed)
Patient presents today for Venofer 300 mg IV, B12 injection and possible blood products HGB 10.3 today. Patient has no complaints today. Vital signs stable. MAR reviewed and updated.   Venofer 300 mg given today per MD orders. Tolerated infusion without adverse affects. Vital signs stable. No complaints at this time. Discharged from clinic by wheel chair and accompanied by granddaughter  in stable condition. Alert and oriented x 3. F/U with [facility] as scheduled.

## 2021-10-15 NOTE — Patient Instructions (Signed)
[facility]  Discharge Instructions: Thank you for choosing [facility] to provide your oncology and hematology care.  If you have a lab appointment with the [facility], please come in thru the Main Entrance and check in at the main information desk.  Wear comfortable clothing and clothing appropriate for easy access to any Portacath or PICC line.   We strive to give you quality time with your provider. You may need to reschedule your appointment if you arrive late (15 or more minutes).  Arriving late affects you and other patients whose appointments are after yours.  Also, if you miss three or more appointments without notifying the office, you may be dismissed from the clinic at the provider's discretion.      For prescription refill requests, have your pharmacy contact our office and allow 72 hours for refills to be completed.    Today you received the following chemotherapy and/or immunotherapy agents Venofer 300 mg , B12 injection.  Iron Sucrose Injection What is this medication? IRON SUCROSE (EYE ern SOO krose) treats low levels of iron (iron deficiency anemia) in people with kidney disease. Iron is a mineral that plays an important role in making red blood cells, which carry oxygen from your lungs to the rest of your body. This medicine may be used for other purposes; ask your health care provider or pharmacist if you have questions. COMMON BRAND NAME(S): Venofer What should I tell my care team before I take this medication? They need to know if you have any of these conditions: Anemia not caused by low iron levels Heart disease High levels of iron in the blood Kidney disease Liver disease An unusual or allergic reaction to iron, other medications, foods, dyes, or preservatives Pregnant or trying to get pregnant Breast-feeding How should I use this medication? This medication is for infusion into a vein. It is given in a hospital or clinic  setting. Talk to your care team about the use of this medication in children. While this medication may be prescribed for children as young as 2 years for selected conditions, precautions do apply. Overdosage: If you think you have taken too much of this medicine contact a poison control center or emergency room at once. NOTE: This medicine is only for you. Do not share this medicine with others. What if I miss a dose? It is important not to miss your dose. Call your care team if you are unable to keep an appointment. What may interact with this medication? Do not take this medication with any of the following: Deferoxamine Dimercaprol Other iron products This medication may also interact with the following: Chloramphenicol Deferasirox This list may not describe all possible interactions. Give your health care provider a list of all the medicines, herbs, non-prescription drugs, or dietary supplements you use. Also tell them if you smoke, drink alcohol, or use illegal drugs. Some items may interact with your medicine. What should I watch for while using this medication? Visit your care team regularly. Tell your care team if your symptoms do not start to get better or if they get worse. You may need blood work done while you are taking this medication. You may need to follow a special diet. Talk to your care team. Foods that contain iron include: whole grains/cereals, dried fruits, beans, or peas, leafy green vegetables, and organ meats (liver, kidney). What side effects may I notice from receiving this medication? Side effects that you should report to your care  team as soon as possible: Allergic reactions--skin rash, itching, hives, swelling of the face, lips, tongue, or throat Low blood pressure--dizziness, feeling faint or lightheaded, blurry vision Shortness of breath Side effects that usually do not require medical attention (report to your care team if they continue or are  bothersome): Flushing Headache Joint pain Muscle pain Nausea Pain, redness, or irritation at injection site This list may not describe all possible side effects. Call your doctor for medical advice about side effects. You may report side effects to FDA at 1-800-FDA-1088. Where should I keep my medication? This medication is given in a hospital or clinic and will not be stored at home. NOTE: This sheet is a summary. It may not cover all possible information. If you have questions about this medicine, talk to your doctor, pharmacist, or health care provider.  2023 Elsevier/Gold Standard (2020-08-07 00:00:00)       To help prevent nausea and vomiting after your treatment, we encourage you to take your nausea medication as directed.  BELOW ARE SYMPTOMS THAT SHOULD BE REPORTED IMMEDIATELY: *FEVER GREATER THAN 100.4 F (38 C) OR HIGHER *CHILLS OR SWEATING *NAUSEA AND VOMITING THAT IS NOT CONTROLLED WITH YOUR NAUSEA MEDICATION *UNUSUAL SHORTNESS OF BREATH *UNUSUAL BRUISING OR BLEEDING *URINARY PROBLEMS (pain or burning when urinating, or frequent urination) *BOWEL PROBLEMS (unusual diarrhea, constipation, pain near the anus) TENDERNESS IN MOUTH AND THROAT WITH OR WITHOUT PRESENCE OF ULCERS (sore throat, sores in mouth, or a toothache) UNUSUAL RASH, SWELLING OR PAIN  UNUSUAL VAGINAL DISCHARGE OR ITCHING   Items with * indicate a potential emergency and should be followed up as soon as possible or go to the Emergency Department if any problems should occur.  Please show the CHEMOTHERAPY ALERT CARD or IMMUNOTHERAPY ALERT CARD at check-in to the Emergency Department and triage nurse.  Should you have questions after your visit or need to cancel or reschedule your appointment, please contact [facility] [phone]  and follow the prompts.  Office hours are 8:00 a.m. to 4:30 p.m. Monday - Friday. Please note that voicemails left after 4:00 p.m. may not be returned until the  following business day.  We are closed weekends and major holidays. You have access to a nurse at all times for urgent questions. Please call the main number to the clinic [phone] and follow the prompts.  For any non-urgent questions, you may also contact your provider using MyChart. We now offer e-Visits for anyone 79 and older to request care online for non-urgent symptoms. For details visit [email or web address].   Also download the MyChart app! Go to the app store, search "MyChart", open the app, select [facility], and log in with your MyChart username and password.  Masks are optional in the cancer centers. If you would like for your care team to wear a mask while they are taking care of you, please let them know. For doctor visits, patients may have with them one support person who is at least 72 years old. At this time, visitors are not allowed in the infusion area.

## 2021-10-19 ENCOUNTER — Encounter: Payer: Self-pay | Admitting: Nurse Practitioner

## 2021-10-19 ENCOUNTER — Other Ambulatory Visit: Payer: Self-pay | Admitting: Nurse Practitioner

## 2021-10-19 ENCOUNTER — Ambulatory Visit: Payer: Medicare PPO | Admitting: Nurse Practitioner

## 2021-10-19 VITALS — BP 130/79 | HR 104 | Temp 98.4°F | Ht 62.0 in | Wt 295.0 lb

## 2021-10-19 DIAGNOSIS — R300 Dysuria: Secondary | ICD-10-CM | POA: Diagnosis not present

## 2021-10-19 LAB — URINALYSIS, ROUTINE W REFLEX MICROSCOPIC
Bilirubin, UA: NEGATIVE
Glucose, UA: NEGATIVE
Ketones, UA: NEGATIVE
Nitrite, UA: NEGATIVE
Specific Gravity, UA: 1.015 (ref 1.005–1.030)
Urobilinogen, Ur: 1 mg/dL (ref 0.2–1.0)
pH, UA: 7.5 (ref 5.0–7.5)

## 2021-10-19 LAB — MICROSCOPIC EXAMINATION
RBC, Urine: >30 /hpf — AB (ref 0–2)
Renal Epithel, UA: NONE SEEN /hpf
WBC, UA: >30 /hpf — AB (ref 0–5)

## 2021-10-19 MED ORDER — SULFAMETHOXAZOLE-TRIMETHOPRIM 800-160 MG PO TABS: 1.0000 | ORAL_TABLET | Freq: Two times a day (BID) | ORAL | 0 refills | Status: DC

## 2021-10-19 NOTE — Addendum Note (Signed)
Addended by: Ivy Lynn on: 10/19/2021 03:47 PM   Modules accepted: Orders

## 2021-10-19 NOTE — Patient Instructions (Signed)
Dysuria Dysuria is pain or discomfort during urination. The pain or discomfort may be felt in the part of the body that drains urine from the bladder (urethra) or in the surrounding tissue of the genitals. The pain may also be felt in the groin area, lower abdomen, or lower back. You may have to urinate frequently or have the sudden feeling that you have to urinate (urgency). Dysuria can affect anyone, but it is more common in females. Dysuria can be caused by many different things, including: Urinary tract infection. Kidney stones or bladder stones. Certain STIs (sexually transmitted infections), such as chlamydia. Dehydration. Inflammation of the tissues of the vagina. Use of certain medicines. Use of certain soaps or scented products that cause irritation. Follow these instructions at home: Medicines Take over-the-counter and prescription medicines only as told by your health care provider. If you were prescribed an antibiotic medicine, take it as told by your health care provider. Do not stop taking the antibiotic even if you start to feel better. Eating and drinking  Drink enough fluid to keep your urine pale yellow. Avoid caffeinated beverages, tea, and alcohol. These beverages can irritate the bladder and make dysuria worse. In males, alcohol may irritate the prostate. General instructions Watch your condition for any changes. Urinate often. Avoid holding urine for long periods of time. If you are female, you should wipe from front to back after urinating or having a bowel movement. Use each piece of toilet paper only once. Empty your bladder after sex. Keep all follow-up visits. This is important. If you had any tests done to find the cause of dysuria, it is up to you to get your test results. Ask your health care provider, or the department that is doing the test, when your results will be ready. Contact a health care provider if: You have a fever. You develop pain in your back or  sides. You have nausea or vomiting. You have blood in your urine. You are not urinating as often as you usually do. Get help right away if: Your pain is severe and not relieved with medicines. You cannot eat or drink without vomiting. You are confused. You have a rapid heartbeat while resting. You have shaking or chills. You feel extremely weak. Summary Dysuria is pain or discomfort while urinating. Many different conditions can lead to dysuria. If you have dysuria, you may have to urinate frequently or have the sudden feeling that you have to urinate (urgency). Watch your condition for any changes. Keep all follow-up visits. Make sure that you urinate often and drink enough fluid to keep your urine pale yellow. This information is not intended to replace advice given to you by your health care provider. Make sure you discuss any questions you have with your health care provider. Document Revised: 10/25/2019 Document Reviewed: 10/25/2019 Elsevier Patient Education  [place].

## 2021-10-19 NOTE — Progress Notes (Addendum)
   Acute Office Visit  Subjective:     Patient ID: Savannah Burns, female    DOB: 31-Jul-1950, 71 y.o.   MRN: [id number]  Chief Complaint  Patient presents with   Dysuria   Urinary Frequency    2 days ago    Flank Pain    Both sides - been going on for a while now     Dysuria  This is a new problem. Episode onset: in the past 4 days. The problem occurs every urination. The problem has been unchanged. The quality of the pain is described as aching and burning. The pain is moderate. There has been no fever. Associated symptoms include flank pain and urgency. Pertinent negatives include no chills. She has tried nothing for the symptoms.    Review of Systems  Constitutional: Negative.  Negative for chills and fever.  HENT: Negative.    Respiratory: Negative.    Cardiovascular: Negative.   Genitourinary:  Positive for dysuria, flank pain and urgency.  Skin: Negative.  Negative for itching and rash.  All other systems reviewed and are negative.       Objective:    BP 130/79   Pulse (!) 104   Temp 98.4 F (36.9 C)   Ht 5' 2"  (1.575 m)   Wt 295 lb (133.8 kg)   SpO2 95%   BMI 53.96 kg/m  BP Readings from Last 3 Encounters:  10/19/21 130/79  10/15/21 (!) 159/58  09/17/21 (!) 167/89   Wt Readings from Last 3 Encounters:  10/19/21 295 lb (133.8 kg)  08/12/21 295 lb (133.8 kg)  07/30/21 299 lb (135.6 kg)      Physical Exam Vitals and nursing note reviewed.  Constitutional:      Appearance: Normal appearance. She is obese.  HENT:     Head: Normocephalic.     Right Ear: External ear normal.     Left Ear: External ear normal.     Nose: Nose normal.     Mouth/Throat:     Mouth: Mucous membranes are moist.     Pharynx: Oropharynx is clear.  Cardiovascular:     Rate and Rhythm: Normal rate and regular rhythm.     Pulses: Normal pulses.     Heart sounds: Normal heart sounds.  Pulmonary:     Effort: Pulmonary effort is normal.     Breath sounds: Normal breath  sounds.  Abdominal:     General: Bowel sounds are normal.     Tenderness: There is right CVA tenderness and left CVA tenderness.  Skin:    General: Skin is warm.     Findings: No rash.  Neurological:     General: No focal deficit present.     Mental Status: She is alert and oriented to person, place, and time.     No results found for any visits on 10/19/21.      Assessment & Plan:   UTI  vs  interstitial cystitis -urinalysis completed results pending -pyridium for pain Precaution and education provided -All questions answered -follow up with unresolved symptoms  Problem List Items Addressed This Visit   None Visit Diagnoses     Dysuria    -  Primary   Relevant Orders   CULTURE, URINE COMPREHENSIVE   Urinalysis, Routine w reflex microscopic       No orders of the defined types were placed in this encounter.   No follow-ups on file.  Ivy Lynn, NP

## 2021-10-22 ENCOUNTER — Inpatient Hospital Stay (HOSPITAL_COMMUNITY): Payer: Medicare PPO

## 2021-10-22 DIAGNOSIS — E538 Deficiency of other specified B group vitamins: Secondary | ICD-10-CM | POA: Diagnosis not present

## 2021-10-22 DIAGNOSIS — Z79899 Other long term (current) drug therapy: Secondary | ICD-10-CM | POA: Diagnosis not present

## 2021-10-22 DIAGNOSIS — D500 Iron deficiency anemia secondary to blood loss (chronic): Secondary | ICD-10-CM | POA: Diagnosis not present

## 2021-10-22 LAB — CBC WITH DIFFERENTIAL/PLATELET
Abs Immature Granulocytes: 0.02 10*3/uL (ref 0.00–0.07)
Basophils Absolute: 0 10*3/uL (ref 0.0–0.1)
Basophils Relative: 0 %
Eosinophils Absolute: 0 10*3/uL (ref 0.0–0.5)
Eosinophils Relative: 1 %
HCT: 33.3 % — ABNORMAL LOW (ref 36.0–46.0)
Hemoglobin: 10.1 g/dL — ABNORMAL LOW (ref 12.0–15.0)
Immature Granulocytes: 1 %
Lymphocytes Relative: 25 %
Lymphs Abs: 0.8 10*3/uL (ref 0.7–4.0)
MCH: 28.5 pg (ref 26.0–34.0)
MCHC: 30.3 g/dL (ref 30.0–36.0)
MCV: 93.8 fL (ref 80.0–100.0)
Monocytes Absolute: 0.4 10*3/uL (ref 0.1–1.0)
Monocytes Relative: 11 %
Neutro Abs: 2.1 10*3/uL (ref 1.7–7.7)
Neutrophils Relative %: 62 %
Platelets: 128 10*3/uL — ABNORMAL LOW (ref 150–400)
RBC: 3.55 MIL/uL — ABNORMAL LOW (ref 3.87–5.11)
RDW: 20.4 % — ABNORMAL HIGH (ref 11.5–15.5)
WBC: 3.4 10*3/uL — ABNORMAL LOW (ref 4.0–10.5)
nRBC: 0 % (ref 0.0–0.2)

## 2021-10-22 LAB — IRON AND TIBC
Iron: 63 ug/dL (ref 28–170)
Saturation Ratios: 18 % (ref 10.4–31.8)
TIBC: 344 ug/dL (ref 250–450)
UIBC: 281 ug/dL

## 2021-10-22 LAB — SAMPLE TO BLOOD BANK

## 2021-10-22 LAB — COMPREHENSIVE METABOLIC PANEL
ALT: 19 U/L (ref 0–44)
AST: 21 U/L (ref 15–41)
Albumin: 3 g/dL — ABNORMAL LOW (ref 3.5–5.0)
Alkaline Phosphatase: 89 U/L (ref 38–126)
Anion gap: 5 (ref 5–15)
BUN: 16 mg/dL (ref 8–23)
CO2: 27 mmol/L (ref 22–32)
Calcium: 8.5 mg/dL — ABNORMAL LOW (ref 8.9–10.3)
Chloride: 104 mmol/L (ref 98–111)
Creatinine, Ser: 0.66 mg/dL (ref 0.44–1.00)
GFR, Estimated: >60 mL/min (ref >60)
Glucose, Bld: 136 mg/dL — ABNORMAL HIGH (ref 70–99)
Potassium: 3.7 mmol/L (ref 3.5–5.1)
Sodium: 136 mmol/L (ref 135–145)
Total Bilirubin: 0.5 mg/dL (ref 0.3–1.2)
Total Protein: 8.8 g/dL — ABNORMAL HIGH (ref 6.5–8.1)

## 2021-10-22 LAB — LACTATE DEHYDROGENASE: LDH: 92 U/L — ABNORMAL LOW (ref 98–192)

## 2021-10-22 LAB — FERRITIN: Ferritin: 91 ng/mL (ref 11–307)

## 2021-10-24 LAB — CULTURE, URINE COMPREHENSIVE

## 2021-10-25 LAB — KAPPA/LAMBDA LIGHT CHAINS
Kappa free light chain: 139.7 mg/L — ABNORMAL HIGH (ref 3.3–19.4)
Kappa, lambda light chain ratio: 13.06 — ABNORMAL HIGH (ref 0.26–1.65)
Lambda free light chains: 10.7 mg/L (ref 5.7–26.3)

## 2021-10-26 ENCOUNTER — Telehealth: Payer: Self-pay | Admitting: Family Medicine

## 2021-10-26 LAB — PROTEIN ELECTROPHORESIS, SERUM
A/G Ratio: 0.6 — ABNORMAL LOW (ref 0.7–1.7)
Albumin ELP: 3.2 g/dL (ref 2.9–4.4)
Alpha-1-Globulin: 0.2 g/dL (ref 0.0–0.4)
Alpha-2-Globulin: 0.7 g/dL (ref 0.4–1.0)
Beta Globulin: 0.9 g/dL (ref 0.7–1.3)
Gamma Globulin: 3.4 g/dL — ABNORMAL HIGH (ref 0.4–1.8)
Globulin, Total: 5.2 g/dL — ABNORMAL HIGH (ref 2.2–3.9)
M-Spike, %: 3.1 g/dL — ABNORMAL HIGH
Total Protein ELP: 8.4 g/dL (ref 6.0–8.5)

## 2021-10-28 ENCOUNTER — Inpatient Hospital Stay: Payer: Medicare PPO | Attending: Hematology

## 2021-10-28 ENCOUNTER — Ambulatory Visit (HOSPITAL_COMMUNITY): Payer: Medicare PPO | Admitting: Hematology

## 2021-10-28 ENCOUNTER — Other Ambulatory Visit (HOSPITAL_COMMUNITY): Payer: Medicare PPO

## 2021-10-28 ENCOUNTER — Inpatient Hospital Stay: Payer: Medicare PPO

## 2021-10-28 ENCOUNTER — Inpatient Hospital Stay: Payer: Medicare PPO | Admitting: Hematology

## 2021-10-28 DIAGNOSIS — Z87442 Personal history of urinary calculi: Secondary | ICD-10-CM | POA: Insufficient documentation

## 2021-10-28 DIAGNOSIS — R0609 Other forms of dyspnea: Secondary | ICD-10-CM | POA: Insufficient documentation

## 2021-10-28 DIAGNOSIS — Z85828 Personal history of other malignant neoplasm of skin: Secondary | ICD-10-CM | POA: Insufficient documentation

## 2021-10-28 DIAGNOSIS — E785 Hyperlipidemia, unspecified: Secondary | ICD-10-CM | POA: Insufficient documentation

## 2021-10-28 DIAGNOSIS — I10 Essential (primary) hypertension: Secondary | ICD-10-CM | POA: Insufficient documentation

## 2021-10-28 DIAGNOSIS — E119 Type 2 diabetes mellitus without complications: Secondary | ICD-10-CM | POA: Insufficient documentation

## 2021-10-28 DIAGNOSIS — C9000 Multiple myeloma not having achieved remission: Secondary | ICD-10-CM | POA: Insufficient documentation

## 2021-10-28 DIAGNOSIS — K7460 Unspecified cirrhosis of liver: Secondary | ICD-10-CM | POA: Insufficient documentation

## 2021-10-28 DIAGNOSIS — D500 Iron deficiency anemia secondary to blood loss (chronic): Secondary | ICD-10-CM | POA: Insufficient documentation

## 2021-10-28 DIAGNOSIS — R4189 Other symptoms and signs involving cognitive functions and awareness: Secondary | ICD-10-CM | POA: Insufficient documentation

## 2021-10-28 DIAGNOSIS — Z87891 Personal history of nicotine dependence: Secondary | ICD-10-CM | POA: Insufficient documentation

## 2021-10-28 DIAGNOSIS — E538 Deficiency of other specified B group vitamins: Secondary | ICD-10-CM | POA: Insufficient documentation

## 2021-10-28 DIAGNOSIS — D6959 Other secondary thrombocytopenia: Secondary | ICD-10-CM | POA: Insufficient documentation

## 2021-10-28 DIAGNOSIS — Z79899 Other long term (current) drug therapy: Secondary | ICD-10-CM | POA: Insufficient documentation

## 2021-10-28 NOTE — Telephone Encounter (Signed)
Please advise 

## 2021-10-28 NOTE — Telephone Encounter (Signed)
Needs to followup with PCP for this

## 2021-10-28 NOTE — Telephone Encounter (Signed)
Pt calling again about his message. Please call back

## 2021-10-29 ENCOUNTER — Ambulatory Visit (HOSPITAL_COMMUNITY)
Admission: RE | Admit: 2021-10-29 | Discharge: 2021-10-29 | Disposition: A | Discharge status: Home / Self Care | Payer: Medicare PPO | Source: Ambulatory Visit | Attending: Physician Assistant | Admitting: Physician Assistant

## 2021-10-29 ENCOUNTER — Inpatient Hospital Stay (HOSPITAL_BASED_OUTPATIENT_CLINIC_OR_DEPARTMENT_OTHER): Payer: Medicare PPO | Admitting: Physician Assistant

## 2021-10-29 ENCOUNTER — Other Ambulatory Visit: Payer: Medicare PPO

## 2021-10-29 VITALS — BP 178/80 | HR 92 | Temp 98.8°F | Ht 62.0 in | Wt 295.0 lb

## 2021-10-29 DIAGNOSIS — E785 Hyperlipidemia, unspecified: Secondary | ICD-10-CM | POA: Diagnosis not present

## 2021-10-29 DIAGNOSIS — E119 Type 2 diabetes mellitus without complications: Secondary | ICD-10-CM | POA: Diagnosis not present

## 2021-10-29 DIAGNOSIS — Z85828 Personal history of other malignant neoplasm of skin: Secondary | ICD-10-CM | POA: Diagnosis not present

## 2021-10-29 DIAGNOSIS — Z87442 Personal history of urinary calculi: Secondary | ICD-10-CM | POA: Diagnosis not present

## 2021-10-29 DIAGNOSIS — C9000 Multiple myeloma not having achieved remission: Secondary | ICD-10-CM | POA: Diagnosis not present

## 2021-10-29 DIAGNOSIS — D6959 Other secondary thrombocytopenia: Secondary | ICD-10-CM | POA: Diagnosis not present

## 2021-10-29 DIAGNOSIS — D500 Iron deficiency anemia secondary to blood loss (chronic): Secondary | ICD-10-CM | POA: Diagnosis not present

## 2021-10-29 DIAGNOSIS — I10 Essential (primary) hypertension: Secondary | ICD-10-CM | POA: Diagnosis not present

## 2021-10-29 DIAGNOSIS — E538 Deficiency of other specified B group vitamins: Secondary | ICD-10-CM | POA: Diagnosis not present

## 2021-10-29 DIAGNOSIS — Z87891 Personal history of nicotine dependence: Secondary | ICD-10-CM | POA: Diagnosis not present

## 2021-10-29 DIAGNOSIS — K7460 Unspecified cirrhosis of liver: Secondary | ICD-10-CM | POA: Diagnosis not present

## 2021-10-29 DIAGNOSIS — Z79899 Other long term (current) drug therapy: Secondary | ICD-10-CM | POA: Diagnosis not present

## 2021-10-29 DIAGNOSIS — R0609 Other forms of dyspnea: Secondary | ICD-10-CM | POA: Diagnosis not present

## 2021-10-29 DIAGNOSIS — R4189 Other symptoms and signs involving cognitive functions and awareness: Secondary | ICD-10-CM | POA: Diagnosis not present

## 2021-10-29 DIAGNOSIS — D472 Monoclonal gammopathy: Secondary | ICD-10-CM | POA: Insufficient documentation

## 2021-10-29 NOTE — Telephone Encounter (Signed)
lmtcb

## 2021-10-29 NOTE — Patient Instructions (Signed)
[facility] at [facility] **   You were seen today by Tarri Abernethy PA-C for your new (suspected) diagnosis of multiple myeloma.  We will check additional tests including labs today, bone marrow biopsy, and PET scan.  You will see Dr. Delton Coombes for follow-up visit after these tests have been checked.     ** Thank you for trusting me with your healthcare!  I strive to provide all of my patients with quality care at each visit.  If you receive a survey for this visit, I would be so grateful to you for taking the time to provide feedback.  Thank you in advance!  ~ Jimmi Sidener                   Dr. Derek Jack   &   Tarri Abernethy, PA-C   - - - - - - - - - - - - - - - - - -    Thank you for choosing [facility] at [facility] to provide your oncology and hematology care.  To afford each patient quality time with our provider, please arrive at least 15 minutes before your scheduled appointment time.   If you have a lab appointment with the [facility] please come in thru the Main Entrance and check in at the main information desk.  You need to re-schedule your appointment should you arrive 10 or more minutes late.  We strive to give you quality time with our providers, and arriving late affects you and other patients whose appointments are after yours.  Also, if you no show three or more times for appointments you may be dismissed from the clinic at the providers discretion.     Again, thank you for choosing [facility].  Our hope is that these requests will decrease the amount of time that you wait before being seen by our physicians.       _____________________________________________________________  Should you have questions after your visit to [facility], please contact our office at [phone] and follow the prompts.  Our office hours are 8:00 a.m. and 4:30 p.m.  Monday - Friday.  Please note that voicemails left after 4:00 p.m. may not be returned until the following business day.  We are closed weekends and major holidays.  You do have access to a nurse 24-7, just call the main number to the clinic [phone] and do not press any options, hold on the line and a nurse will answer the phone.    For prescription refill requests, have your pharmacy contact our office and allow 72 hours.

## 2021-10-29 NOTE — Telephone Encounter (Signed)
She was on lisinopril at one point but her BP was normal when she saw Ar[facility] Fillers a few days ago.  Is she having high BPs all of the sudden?  Have her come in for a BP check with nurse before we reinitiate anything.

## 2021-10-29 NOTE — Progress Notes (Unsigned)
[place], [place] [address]   CLINIC:  Medical Oncology/Hematology  PCP:  [name], DO [place] [id number] [phone]   REASON FOR VISIT:  Follow-up for ***  PRIOR THERAPY: ***  CURRENT THERAPY: ***  INTERVAL HISTORY:  Ms. Lippert 71 y.o. female returns for routine follow-up of ***  At today's visit, she reports feeling ***.  No recent hospitalizations, surgeries, or changes in baseline health status.  *** No fatigue, fever, chills, shortness of breath, cough, chest pain, nausea, vomiting, abdominal pain.  Denies any signs or symptoms of blood loss.  No current signs or symptoms of blood clots.  She has ***% energy and ***% appetite. She endorses that she is maintaining a stable weight.    REVIEW OF SYSTEMS:  Review of Systems - Oncology    PAST MEDICAL/SURGICAL HISTORY:  Past Medical History:  Diagnosis Date   Adrenal adenoma, left    Stable   Anxiety    Arthritis    bilateral hands   Depression    Diabetes mellitus, type 2 ([place]) 08/12/2008   Qualifier: Diagnosis of  By: Deborra Medina MD, Talia     Dyspnea    Esophageal varices (HCC)    Grade II diastolic dysfunction    History of kidney stones    Hyperlipidemia    Hypertension    Lower back pain    Lower GI bleed 03/19/2020   Panic attacks    Pneumonia    currently taking antibiotic and prednisone for early stages of pneumonia   Pulmonary nodules    bilateral   Skin cancer    face   Past Surgical History:  Procedure Laterality Date   BIOPSY  04/07/2020   Procedure: BIOPSY;  Surgeon: Eloise Harman, DO;  [facility];  Service: Endoscopy;;   Breast Cystectomy  Right    CESAREAN SECTION     COLONOSCOPY WITH PROPOFOL N/A 01/25/2020   Dr. Abbey Chatters: Nonbleeding internal hemorrhoids, diverticulosis, 5 mm polyp removed from the ascending colon, 10 mm polyp removed from the sigmoid colon, 30 mm polyp (tubulovillous adenoma with no  high-grade dysplasia) removed from the transverse colon via piecemeal status post tattoo.  Other polyps were tubular adenomas.  3 month surveillance colonoscopy recommended.   COLONOSCOPY WITH PROPOFOL N/A 04/07/2020   Procedure: COLONOSCOPY WITH PROPOFOL;  Surgeon: Eloise Harman, DO;  [facility];  Service: Endoscopy;  Laterality: N/A;  3:00pm, pt knows new time per office   CYSTOSCOPY/URETEROSCOPY/HOLMIUM LASER/STENT PLACEMENT Bilateral 03/01/2019   Procedure: CYSTOSCOPY/RETROGRADEURETEROSCOPY/HOLMIUM LASER/STENT PLACEMENT;  Surgeon: Ceasar Mons, MD;  Location: [facility];  Service: Urology;  Laterality: Bilateral;  ONLY NEEDS 60 MIN   ESOPHAGOGASTRODUODENOSCOPY (EGD) WITH PROPOFOL N/A 01/25/2020   Dr. Abbey Chatters: 4 columns grade 1 esophageal varices   ESOPHAGOGASTRODUODENOSCOPY (EGD) WITH PROPOFOL N/A 05/18/2020   Procedure: ESOPHAGOGASTRODUODENOSCOPY (EGD) WITH PROPOFOL;  Surgeon: Eloise Harman, DO;  [facility];  Service: Endoscopy;  Laterality: N/A;   ESOPHAGOGASTRODUODENOSCOPY (EGD) WITH PROPOFOL N/A 07/28/2020   Procedure: ESOPHAGOGASTRODUODENOSCOPY (EGD) WITH PROPOFOL;  Surgeon: Eloise Harman, DO;  [facility];  Service: Endoscopy;  Laterality: N/A;  am or early PM due to givens capsule placement   GIVENS CAPSULE STUDY N/A 05/18/2020   Procedure: [place];  Surgeon: Harvel Quale, MD;  [facility];  Service: Gastroenterology;  Laterality: N/A;   GIVENS CAPSULE STUDY N/A 07/28/2020   Procedure: GIVENS CAPSULE  STUDY;  Surgeon: Eloise Harman, DO;  [facility];  Service: Endoscopy;  Laterality: N/A;   POLYPECTOMY  01/25/2020   Procedure: POLYPECTOMY;  Surgeon: Eloise Harman, DO;  [facility];  Service: Endoscopy;;   POLYPECTOMY  04/07/2020   Procedure: POLYPECTOMY INTESTINAL;  Surgeon: Eloise Harman, DO;  [facility];  Service: Endoscopy;;   SKIN CANCER EXCISION      Face   SPINE SURGERY     SUBMUCOSAL TATTOO INJECTION  01/25/2020   Procedure: SUBMUCOSAL TATTOO INJECTION;  Surgeon: Eloise Harman, DO;  [facility];  Service: Endoscopy;;     SOCIAL HISTORY:  Social History   Socioeconomic History   Marital status: Widowed    Spouse name: Not on file   Number of children: 2   Years of education: 14   Highest education level: Not on file  Occupational History   Occupation: Retired   Tobacco Use   Smoking status: Former    Packs/day: 1.50    Years: 40.00    Total pack years: 60.00    Types: Cigarettes    Quit date: 04/29/2015    Years since quitting: 6.5   Smokeless tobacco: Never   Tobacco comments:    Quit smoking 04/2015- Previous 1.5 ppd smoker  Vaping Use   Vaping Use: Never used  Substance and Sexual Activity   Alcohol use: No    Alcohol/week: 0.0 standard drinks of alcohol   Drug use: No   Sexual activity: Not Currently    Birth control/protection: Post-menopausal  Other Topics Concern   Not on file  Social History Narrative   Her 12 year old granddaughter lives with her - one daughter lives nearby, but she doesn't have a good relationship with her. Has a great relationship with other daughter who lives 1.5 hrs away - talks to her daily on the phone.   Social Determinants of Health   Financial Resource Strain: Medium Risk (07/27/2021)   Overall Financial Resource Strain (CARDIA)    Difficulty of Paying Living Expenses: Somewhat hard  Food Insecurity: Food Insecurity Present (07/27/2021)   Hunger Vital Sign    Worried About Running Out of Food in the Last Year: Sometimes true    Ran Out of Food in the Last Year: Never true  Transportation Needs: No Transportation Needs (07/27/2021)   PRAPARE - Hydrologist (Medical): No    Lack of Transportation (Non-Medical): No  Physical Activity: Inactive (07/27/2021)   Exercise Vital Sign    Days of Exercise per Week: 0 days    Minutes of Exercise  per Session: 0 min  Stress: Stress Concern Present (07/27/2021)   [place]    Feeling of Stress : To some extent  Social Connections: Socially Isolated (07/27/2021)   Social Connection and Isolation Panel [NHANES]    Frequency of Communication with Friends and Family: More than three times a week    Frequency of Social Gatherings with Friends and Family: Once a week    Attends Religious Services: Never    Marine scientist or Organizations: No    Attends Archivist Meetings: Never    Marital Status: Widowed  Intimate Partner Violence: Not At Risk (07/27/2021)   Humiliation, Afraid, Rape, and Kick questionnaire    Fear of Current or Ex-Partner: No    Emotionally Abused: No    Physically Abused: No  Sexually Abused: No    FAMILY HISTORY:  Family History  Problem Relation Age of Onset   Diabetes Father    Heart disease Father 37       MI   Hypertension Father    Anemia Mother        Transfusion dependent   COPD Sister    Cancer Paternal Grandmother 82       Pancreatic    CURRENT MEDICATIONS:  Outpatient Encounter Medications as of 10/29/2021  Medication Sig Note   Alcohol Swabs (B-D SINGLE USE SWABS REGULAR) PADS Check BS daily Dx E11.9    Blood Glucose Calibration (TRUE METRIX LEVEL 1) Low SOLN Use w/ glucose monitor Dx E11.9    Blood Glucose Monitoring Suppl (TRUE METRIX AIR GLUCOSE METER) w/Device KIT Check BS daily Dx E11.9    Cholecalciferol (VITAMIN D) 50 MCG (2000 UT) tablet Take 4,000 Units by mouth daily.    Cyanocobalamin (VITAMIN B-12 IJ) Inject 1,000 mcg as directed every 30 (thirty) days.    desvenlafaxine (PRISTIQ) 50 MG 24 hr tablet Take 1 tablet (50 mg total) by mouth daily.    esomeprazole (NEXIUM) 20 MG capsule Take 1 capsule (20 mg total) by mouth daily at 12 noon.    furosemide (LASIX) 20 MG tablet Take 20 mg by mouth. bid    glucose blood (TRUE METRIX BLOOD GLUCOSE TEST) test  strip Check BS daily Dx E11.9    lovastatin (MEVACOR) 20 MG tablet Take 1 tablet (20 mg total) by mouth at bedtime.    omeprazole (PRILOSEC) 20 MG capsule Take 20 mg by mouth 2 (two) times daily.    oxyCODONE-acetaminophen (PERCOCET) 10-325 MG tablet Take 1 tablet by mouth every 6 (six) hours as needed for pain.    OXYGEN Inhale 3 L into the lungs continuous. 07/27/2021: Now using 5lpm when walking   potassium chloride SA (KLOR-CON) 20 MEQ tablet Take 2 tablets (40 mEq total) by mouth 2 (two) times daily. (Patient taking differently: Take 40 mEq by mouth daily.)    Semaglutide,0.25 or 0.5MG/DOS, (OZEMPIC, 0.25 OR 0.5 MG/DOSE,) 2 MG/3ML SOPN Inject 0.25 mg into the skin every 7 (seven) days for 28 days, THEN 0.5 mg every 7 (seven) days.    sulfamethoxazole-trimethoprim (BACTRIM DS) 800-160 MG tablet Take 1 tablet by mouth 2 (two) times daily.    TRUEplus Lancets 33G MISC Check BS daily Dx E11.9    bismuth-metronidazole-tetracycline (PYLERA) 140-125-125 MG capsule Take by mouth.    ferrous sulfate 325 (65 FE) MG tablet Take 1 tablet (325 mg total) by mouth 2 (two) times daily with a meal. (Patient taking differently: Take 325 mg by mouth daily with breakfast.)    torsemide (DEMADEX) 20 MG tablet Take 3 tablets (60 mg total) by mouth 2 (two) times daily. (Patient taking differently: Take 50 mg by mouth 2 (two) times daily.)    [DISCONTINUED] albuterol (PROVENTIL HFA;VENTOLIN HFA) 108 (90 Base) MCG/ACT inhaler Inhale 2 puffs into the lungs every 6 (six) hours as needed for wheezing or shortness of breath.    No facility-administered encounter medications on file as of 10/29/2021.    ALLERGIES:  Allergies  Allergen Reactions   Keflex [Cephalexin] Nausea And Vomiting     PHYSICAL EXAM:  ECOG PERFORMANCE STATUS: {CHL ONC ECOG CH:[id number]}  Vitals:   10/29/21 1345  BP: (!) 178/80  Pulse: 92  Temp: 98.8 F (37.1 C)  SpO2: 92%   Filed Weights   10/29/21 1345  Weight: 295 lb (133.8 kg)  Physical Exam   LABORATORY DATA:  I have reviewed the labs as listed.  CBC    Component Value Date/Time   WBC 3.4 (L) 10/22/2021 0806   RBC 3.55 (L) 10/22/2021 0806   HGB 10.1 (L) 10/22/2021 0806   HGB 9.1 (L) 02/26/2021 1129   HCT 33.3 (L) 10/22/2021 0806   HCT 30.5 (L) 02/26/2021 1129   PLT 128 (L) 10/22/2021 0806   PLT 104 (L) 02/26/2021 1129   MCV 93.8 10/22/2021 0806   MCV 87 02/26/2021 1129   MCH 28.5 10/22/2021 0806   MCHC 30.3 10/22/2021 0806   RDW 20.4 (H) 10/22/2021 0806   RDW 16.4 (H) 02/26/2021 1129   LYMPHSABS 0.8 10/22/2021 0806   LYMPHSABS 1.8 06/24/2020 1529   MONOABS 0.4 10/22/2021 0806   EOSABS 0.0 10/22/2021 0806   EOSABS 0.1 06/24/2020 1529   BASOSABS 0.0 10/22/2021 0806   BASOSABS 0.0 06/24/2020 1529      Latest Ref Rng & Units 10/22/2021    8:06 AM 08/13/2021    9:42 AM 06/25/2021    8:28 AM  CMP  Glucose 70 - 99 mg/dL 136  211  173   BUN 8 - 23 mg/dL _0 Creatinine 0.44 - 1.00 mg/dL 0.66  0.66  0.59   Sodium 135 - 145 mmol/L 136  138  136   Potassium 3.5 - 5.1 mmol/L 3.7  3.9  3.8   Chloride 98 - 111 mmol/L 104  104  103   CO2 22 - 32 mmol/L _1 Calcium 8.9 - 10.3 mg/dL 8.5  8.2  8.1   Total Protein 6.5 - 8.1 g/dL 8.8  8.2  8.2   Total Bilirubin 0.3 - 1.2 mg/dL 0.5  0.3  0.4   Alkaline Phos 38 - 126 U/L 89  90  84   AST 15 - 41 U/L _2 ALT 0 - 44 U/L _3 DIAGNOSTIC IMAGING:  I have independently reviewed the relevant imaging and discussed with the patient.  ASSESSMENT & PLAN: 1.  *** - *** - PLAN: ***  2.  *** - *** - PLAN: ***  PLAN SUMMARY & DISPOSITION: ***  All questions were answered. The patient knows to call the clinic with any problems, questions or concerns.  Medical decision making: ***  Time spent on visit: I spent {CHL ONC TIME VISIT - PQSOX:[id number]} counseling the patient face to face. The total time spent in the appointment was {CHL ONC TIME VISIT - PWKHI:[id number]}  and more than 50% was on counseling.   Harriett Rush, PA-C  ***

## 2021-10-30 ENCOUNTER — Encounter (HOSPITAL_COMMUNITY): Payer: Self-pay | Admitting: Hematology

## 2021-11-02 ENCOUNTER — Inpatient Hospital Stay: Payer: Medicare PPO

## 2021-11-02 ENCOUNTER — Inpatient Hospital Stay: Payer: Medicare PPO | Admitting: Hematology

## 2021-11-03 DIAGNOSIS — F1120 Opioid dependence, uncomplicated: Secondary | ICD-10-CM | POA: Diagnosis not present

## 2021-11-03 DIAGNOSIS — Z79899 Other long term (current) drug therapy: Secondary | ICD-10-CM | POA: Diagnosis not present

## 2021-11-03 DIAGNOSIS — E119 Type 2 diabetes mellitus without complications: Secondary | ICD-10-CM | POA: Diagnosis not present

## 2021-11-03 DIAGNOSIS — G894 Chronic pain syndrome: Secondary | ICD-10-CM | POA: Diagnosis not present

## 2021-11-03 DIAGNOSIS — Z6841 Body Mass Index (BMI) 40.0 and over, adult: Secondary | ICD-10-CM | POA: Diagnosis not present

## 2021-11-03 DIAGNOSIS — R030 Elevated blood-pressure reading, without diagnosis of hypertension: Secondary | ICD-10-CM | POA: Diagnosis not present

## 2021-11-03 DIAGNOSIS — M5136 Other intervertebral disc degeneration, lumbar region: Secondary | ICD-10-CM | POA: Diagnosis not present

## 2021-11-04 ENCOUNTER — Encounter (HOSPITAL_COMMUNITY): Admission: RE | Admit: 2021-11-04 | Payer: Medicare PPO | Source: Ambulatory Visit

## 2021-11-04 ENCOUNTER — Inpatient Hospital Stay: Payer: Medicare PPO

## 2021-11-04 ENCOUNTER — Ambulatory Visit (HOSPITAL_COMMUNITY): Payer: Medicare PPO

## 2021-11-04 ENCOUNTER — Ambulatory Visit: Payer: Medicare PPO

## 2021-11-04 DIAGNOSIS — D500 Iron deficiency anemia secondary to blood loss (chronic): Secondary | ICD-10-CM | POA: Diagnosis not present

## 2021-11-04 DIAGNOSIS — E538 Deficiency of other specified B group vitamins: Secondary | ICD-10-CM | POA: Diagnosis not present

## 2021-11-04 DIAGNOSIS — E119 Type 2 diabetes mellitus without complications: Secondary | ICD-10-CM | POA: Diagnosis not present

## 2021-11-04 DIAGNOSIS — C9000 Multiple myeloma not having achieved remission: Secondary | ICD-10-CM

## 2021-11-04 DIAGNOSIS — K7460 Unspecified cirrhosis of liver: Secondary | ICD-10-CM | POA: Diagnosis not present

## 2021-11-04 DIAGNOSIS — R4189 Other symptoms and signs involving cognitive functions and awareness: Secondary | ICD-10-CM | POA: Diagnosis not present

## 2021-11-04 DIAGNOSIS — R0609 Other forms of dyspnea: Secondary | ICD-10-CM | POA: Diagnosis not present

## 2021-11-04 DIAGNOSIS — D6959 Other secondary thrombocytopenia: Secondary | ICD-10-CM | POA: Diagnosis not present

## 2021-11-04 DIAGNOSIS — Z87442 Personal history of urinary calculi: Secondary | ICD-10-CM | POA: Diagnosis not present

## 2021-11-04 LAB — CBC
HCT: 32.3 % — ABNORMAL LOW (ref 36.0–46.0)
Hemoglobin: 9.9 g/dL — ABNORMAL LOW (ref 12.0–15.0)
MCH: 29.4 pg (ref 26.0–34.0)
MCHC: 30.7 g/dL (ref 30.0–36.0)
MCV: 95.8 fL (ref 80.0–100.0)
Platelets: 123 10*3/uL — ABNORMAL LOW (ref 150–400)
RBC: 3.37 MIL/uL — ABNORMAL LOW (ref 3.87–5.11)
RDW: 19.9 % — ABNORMAL HIGH (ref 11.5–15.5)
WBC: 5 10*3/uL (ref 4.0–10.5)
nRBC: 0 % (ref 0.0–0.2)

## 2021-11-04 LAB — SAMPLE TO BLOOD BANK

## 2021-11-05 ENCOUNTER — Inpatient Hospital Stay: Payer: Medicare PPO

## 2021-11-05 VITALS — BP 142/92 | HR 99 | Temp 99.2°F | Resp 20

## 2021-11-05 DIAGNOSIS — K7460 Unspecified cirrhosis of liver: Secondary | ICD-10-CM | POA: Diagnosis not present

## 2021-11-05 DIAGNOSIS — D500 Iron deficiency anemia secondary to blood loss (chronic): Secondary | ICD-10-CM | POA: Diagnosis not present

## 2021-11-05 DIAGNOSIS — E119 Type 2 diabetes mellitus without complications: Secondary | ICD-10-CM | POA: Diagnosis not present

## 2021-11-05 DIAGNOSIS — D6959 Other secondary thrombocytopenia: Secondary | ICD-10-CM | POA: Diagnosis not present

## 2021-11-05 DIAGNOSIS — R0609 Other forms of dyspnea: Secondary | ICD-10-CM | POA: Diagnosis not present

## 2021-11-05 DIAGNOSIS — C9000 Multiple myeloma not having achieved remission: Secondary | ICD-10-CM | POA: Diagnosis not present

## 2021-11-05 DIAGNOSIS — Z87442 Personal history of urinary calculi: Secondary | ICD-10-CM | POA: Diagnosis not present

## 2021-11-05 DIAGNOSIS — E538 Deficiency of other specified B group vitamins: Secondary | ICD-10-CM | POA: Diagnosis not present

## 2021-11-05 DIAGNOSIS — R4189 Other symptoms and signs involving cognitive functions and awareness: Secondary | ICD-10-CM | POA: Diagnosis not present

## 2021-11-05 LAB — BETA 2 MICROGLOBULIN, SERUM: Beta-2 Microglobulin: 2.6 mg/L — ABNORMAL HIGH (ref 0.6–2.4)

## 2021-11-05 MED ORDER — SODIUM CHLORIDE 0.9 % IV SOLN: Freq: Once | INTRAVENOUS | Status: AC

## 2021-11-05 MED ORDER — LORATADINE 10 MG PO TABS
10.0000 mg | ORAL_TABLET | Freq: Once | ORAL | Status: AC
  Administered 2021-11-05: 10 mg via ORAL

## 2021-11-05 MED ORDER — SODIUM CHLORIDE 0.9 % IV SOLN
300.0000 mg | Freq: Once | INTRAVENOUS | Status: AC
  Administered 2021-11-05: 300 mg via INTRAVENOUS
  Filled 2021-11-05: qty 300

## 2021-11-05 NOTE — Patient Instructions (Signed)
[facility]  Discharge Instructions: Thank you for choosing [facility] to provide your oncology and hematology care.  If you have a lab appointment with the [facility], please come in thru the Main Entrance and check in at the main information desk.  Wear comfortable clothing and clothing appropriate for easy access to any Portacath or PICC line.   We strive to give you quality time with your provider. You may need to reschedule your appointment if you arrive late (15 or more minutes).  Arriving late affects you and other patients whose appointments are after yours.  Also, if you miss three or more appointments without notifying the office, you may be dismissed from the clinic at the provider's discretion.      For prescription refill requests, have your pharmacy contact our office and allow 72 hours for refills to be completed.    Today you received the following Venofer, return as scheduled.   To help prevent nausea and vomiting after your treatment, we encourage you to take your nausea medication as directed.  BELOW ARE SYMPTOMS THAT SHOULD BE REPORTED IMMEDIATELY: *FEVER GREATER THAN 100.4 F (38 C) OR HIGHER *CHILLS OR SWEATING *NAUSEA AND VOMITING THAT IS NOT CONTROLLED WITH YOUR NAUSEA MEDICATION *UNUSUAL SHORTNESS OF BREATH *UNUSUAL BRUISING OR BLEEDING *URINARY PROBLEMS (pain or burning when urinating, or frequent urination) *BOWEL PROBLEMS (unusual diarrhea, constipation, pain near the anus) TENDERNESS IN MOUTH AND THROAT WITH OR WITHOUT PRESENCE OF ULCERS (sore throat, sores in mouth, or a toothache) UNUSUAL RASH, SWELLING OR PAIN  UNUSUAL VAGINAL DISCHARGE OR ITCHING   Items with * indicate a potential emergency and should be followed up as soon as possible or go to the Emergency Department if any problems should occur.  Please show the CHEMOTHERAPY ALERT CARD or IMMUNOTHERAPY ALERT CARD at check-in to the Emergency Department and triage  nurse.  Should you have questions after your visit or need to cancel or reschedule your appointment, please contact [facility] [phone]  and follow the prompts.  Office hours are 8:00 a.m. to 4:30 p.m. Monday - Friday. Please note that voicemails left after 4:00 p.m. may not be returned until the following business day.  We are closed weekends and major holidays. You have access to a nurse at all times for urgent questions. Please call the main number to the clinic [phone] and follow the prompts.  For any non-urgent questions, you may also contact your provider using MyChart. We now offer e-Visits for anyone 64 and older to request care online for non-urgent symptoms. For details visit [email or web address].   Also download the MyChart app! Go to the app store, search "MyChart", open the app, select [facility], and log in with your MyChart username and password.  Masks are optional in the cancer centers. If you would like for your care team to wear a mask while they are taking care of you, please let them know. For doctor visits, patients may have with them one support person who is at least 72 years old. At this time, visitors are not allowed in the infusion area.

## 2021-11-05 NOTE — Progress Notes (Signed)
Patient tolerated iron infusion with no complaints voiced. Peripheral IV site clean and dry with good blood return noted before and after infusion. Band aid applied. VSS with discharge and left in satisfactory condition with no s/s of distress noted.

## 2021-11-08 DIAGNOSIS — Z79899 Other long term (current) drug therapy: Secondary | ICD-10-CM | POA: Diagnosis not present

## 2021-11-10 DIAGNOSIS — R161 Splenomegaly, not elsewhere classified: Secondary | ICD-10-CM | POA: Diagnosis not present

## 2021-11-10 DIAGNOSIS — N200 Calculus of kidney: Secondary | ICD-10-CM | POA: Diagnosis not present

## 2021-11-10 DIAGNOSIS — K766 Portal hypertension: Secondary | ICD-10-CM | POA: Diagnosis not present

## 2021-11-10 DIAGNOSIS — R918 Other nonspecific abnormal finding of lung field: Secondary | ICD-10-CM | POA: Diagnosis not present

## 2021-11-10 DIAGNOSIS — I110 Hypertensive heart disease with heart failure: Secondary | ICD-10-CM | POA: Diagnosis not present

## 2021-11-10 DIAGNOSIS — E279 Disorder of adrenal gland, unspecified: Secondary | ICD-10-CM | POA: Diagnosis not present

## 2021-11-10 DIAGNOSIS — K7460 Unspecified cirrhosis of liver: Secondary | ICD-10-CM | POA: Diagnosis not present

## 2021-11-10 DIAGNOSIS — K8020 Calculus of gallbladder without cholecystitis without obstruction: Secondary | ICD-10-CM | POA: Diagnosis not present

## 2021-11-10 DIAGNOSIS — E278 Other specified disorders of adrenal gland: Secondary | ICD-10-CM | POA: Diagnosis not present

## 2021-11-10 DIAGNOSIS — D696 Thrombocytopenia, unspecified: Secondary | ICD-10-CM | POA: Diagnosis not present

## 2021-11-11 ENCOUNTER — Encounter (HOSPITAL_COMMUNITY): Payer: Self-pay

## 2021-11-11 ENCOUNTER — Ambulatory Visit (HOSPITAL_COMMUNITY)
Admission: RE | Admit: 2021-11-11 | Discharge: 2021-11-11 | Disposition: A | Discharge status: Home / Self Care | Payer: Medicare PPO | Source: Ambulatory Visit | Attending: Physician Assistant | Admitting: Physician Assistant

## 2021-11-11 DIAGNOSIS — C9000 Multiple myeloma not having achieved remission: Secondary | ICD-10-CM

## 2021-11-18 ENCOUNTER — Other Ambulatory Visit: Payer: Self-pay | Admitting: Internal Medicine

## 2021-11-18 ENCOUNTER — Ambulatory Visit (HOSPITAL_COMMUNITY)
Admission: RE | Admit: 2021-11-18 | Discharge: 2021-11-18 | Disposition: A | Discharge status: Home / Self Care | Payer: Medicare PPO | Source: Ambulatory Visit | Attending: Physician Assistant | Admitting: Physician Assistant

## 2021-11-18 DIAGNOSIS — C9000 Multiple myeloma not having achieved remission: Secondary | ICD-10-CM | POA: Diagnosis not present

## 2021-11-18 MED ORDER — FLUDEOXYGLUCOSE F - 18 (FDG) INJECTION
15.9600 | Freq: Once | INTRAVENOUS | Status: AC | PRN
  Administered 2021-11-18: 15.96 via INTRAVENOUS

## 2021-11-19 ENCOUNTER — Ambulatory Visit (HOSPITAL_COMMUNITY)
Admission: RE | Admit: 2021-11-19 | Discharge: 2021-11-19 | Disposition: A | Discharge status: Home / Self Care | Payer: Medicare PPO | Source: Ambulatory Visit | Attending: Physician Assistant | Admitting: Physician Assistant

## 2021-11-19 ENCOUNTER — Encounter (HOSPITAL_COMMUNITY): Payer: Self-pay

## 2021-11-19 ENCOUNTER — Other Ambulatory Visit: Payer: Self-pay

## 2021-11-19 DIAGNOSIS — Z1379 Encounter for other screening for genetic and chromosomal anomalies: Secondary | ICD-10-CM | POA: Insufficient documentation

## 2021-11-19 DIAGNOSIS — R7989 Other specified abnormal findings of blood chemistry: Secondary | ICD-10-CM | POA: Insufficient documentation

## 2021-11-19 DIAGNOSIS — C9000 Multiple myeloma not having achieved remission: Secondary | ICD-10-CM

## 2021-11-19 DIAGNOSIS — D696 Thrombocytopenia, unspecified: Secondary | ICD-10-CM | POA: Diagnosis not present

## 2021-11-19 LAB — CBC WITH DIFFERENTIAL/PLATELET
Abs Immature Granulocytes: 0.01 10*3/uL (ref 0.00–0.07)
Basophils Absolute: 0 10*3/uL (ref 0.0–0.1)
Basophils Relative: 0 %
Eosinophils Absolute: 0 10*3/uL (ref 0.0–0.5)
Eosinophils Relative: 0 %
HCT: 30.8 % — ABNORMAL LOW (ref 36.0–46.0)
Hemoglobin: 9.4 g/dL — ABNORMAL LOW (ref 12.0–15.0)
Immature Granulocytes: 0 %
Lymphocytes Relative: 27 %
Lymphs Abs: 1.2 10*3/uL (ref 0.7–4.0)
MCH: 29.7 pg (ref 26.0–34.0)
MCHC: 30.5 g/dL (ref 30.0–36.0)
MCV: 97.2 fL (ref 80.0–100.0)
Monocytes Absolute: 0.4 10*3/uL (ref 0.1–1.0)
Monocytes Relative: 9 %
Neutro Abs: 2.9 10*3/uL (ref 1.7–7.7)
Neutrophils Relative %: 64 %
Platelets: 123 10*3/uL — ABNORMAL LOW (ref 150–400)
RBC: 3.17 MIL/uL — ABNORMAL LOW (ref 3.87–5.11)
RDW: 18.5 % — ABNORMAL HIGH (ref 11.5–15.5)
WBC: 4.6 10*3/uL (ref 4.0–10.5)
nRBC: 0 % (ref 0.0–0.2)

## 2021-11-19 LAB — GLUCOSE, CAPILLARY: Glucose-Capillary: 143 mg/dL — ABNORMAL HIGH (ref 70–99)

## 2021-11-19 MED ORDER — FENTANYL CITRATE (PF) 100 MCG/2ML IJ SOLN
INTRAMUSCULAR | Status: AC | PRN
  Administered 2021-11-19 (×2): 50 ug via INTRAVENOUS

## 2021-11-19 MED ORDER — MIDAZOLAM HCL 2 MG/2ML IJ SOLN
INTRAMUSCULAR | Status: AC
  Filled 2021-11-19: qty 4

## 2021-11-19 MED ORDER — HYDROCODONE-ACETAMINOPHEN 5-325 MG PO TABS: 1.0000 | ORAL_TABLET | ORAL | Status: DC | PRN

## 2021-11-19 MED ORDER — FENTANYL CITRATE (PF) 100 MCG/2ML IJ SOLN
INTRAMUSCULAR | Status: AC
  Filled 2021-11-19: qty 4

## 2021-11-19 MED ORDER — MIDAZOLAM HCL 2 MG/2ML IJ SOLN
INTRAMUSCULAR | Status: AC | PRN
  Administered 2021-11-19 (×2): 1 mg via INTRAVENOUS

## 2021-11-19 MED ORDER — FLUMAZENIL 0.5 MG/5ML IV SOLN
INTRAVENOUS | Status: AC
  Filled 2021-11-19: qty 5

## 2021-11-19 MED ORDER — SODIUM CHLORIDE 0.9 % IV SOLN: INTRAVENOUS | Status: DC

## 2021-11-19 MED ORDER — NALOXONE HCL 0.4 MG/ML IJ SOLN
INTRAMUSCULAR | Status: AC
  Filled 2021-11-19: qty 1

## 2021-11-19 NOTE — Progress Notes (Signed)
Call to patient's designated contact - Granddaughter - Quarry manager. Discussed timeframes for pre/intra and post procedure. Verbalized understanding. Aware that call will be made post procedure to discuss pick up time and any further discharge instructions.

## 2021-11-19 NOTE — H&P (Signed)
Referring Physician(s): [place] M/Katragadda,S  Supervising Physician: Arne Cleveland  Patient Status:  [facility]  Chief Complaint:  "I'm having a bone marrow biopsy"  Subjective: Patient known to IR service from bone marrow biopsy in 2022.  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  She currently denies fever, headache, chest pain, abdominal pain, nausea, vomiting or bleeding.  She does have dyspnea with exertion and back pain.  Past Medical History:  Diagnosis Date   Adrenal adenoma, left    Stable   Anxiety    Arthritis    bilateral hands   Depression    Diabetes mellitus, type 2 ([place]) 08/12/2008   Qualifier: Diagnosis of  By: Deborra Medina MD, Talia     Dyspnea    Esophageal varices (HCC)    Grade II diastolic dysfunction    History of kidney stones    Hyperlipidemia    Hypertension    Lower back pain    Lower GI bleed 03/19/2020   Panic attacks    Pneumonia    currently taking antibiotic and prednisone for early stages of pneumonia   Pulmonary nodules    bilateral   Skin cancer    face   Past Surgical History:  Procedure Laterality Date   BIOPSY  04/07/2020   Procedure: BIOPSY;  Surgeon: Eloise Harman, DO;  [facility];  Service: Endoscopy;;   Breast Cystectomy  Right    CESAREAN SECTION     COLONOSCOPY WITH PROPOFOL N/A 01/25/2020   Dr. Abbey Chatters: Nonbleeding internal hemorrhoids, diverticulosis, 5 mm polyp removed from the ascending colon, 10 mm polyp removed from the sigmoid colon, 30 mm polyp (tubulovillous adenoma with no high-grade dysplasia) removed from the transverse colon via piecemeal status post tattoo.  Other polyps were tubular adenomas.  3 month surveillance colonoscopy recommended.   COLONOSCOPY WITH PROPOFOL N/A 04/07/2020   Procedure: COLONOSCOPY WITH PROPOFOL;  Surgeon: Eloise Harman, DO;   [facility];  Service: Endoscopy;  Laterality: N/A;  3:00pm, pt knows new time per office   CYSTOSCOPY/URETEROSCOPY/HOLMIUM LASER/STENT PLACEMENT Bilateral 03/01/2019   Procedure: CYSTOSCOPY/RETROGRADEURETEROSCOPY/HOLMIUM LASER/STENT PLACEMENT;  Surgeon: Ceasar Mons, MD;  [facility];  Service: Urology;  Laterality: Bilateral;  ONLY NEEDS 60 MIN   ESOPHAGOGASTRODUODENOSCOPY (EGD) WITH PROPOFOL N/A 01/25/2020   Dr. Abbey Chatters: 4 columns grade 1 esophageal varices   ESOPHAGOGASTRODUODENOSCOPY (EGD) WITH PROPOFOL N/A 05/18/2020   Procedure: ESOPHAGOGASTRODUODENOSCOPY (EGD) WITH PROPOFOL;  Surgeon: Eloise Harman, DO;  [facility];  Service: Endoscopy;  Laterality: N/A;   ESOPHAGOGASTRODUODENOSCOPY (EGD) WITH PROPOFOL N/A 07/28/2020   Procedure: ESOPHAGOGASTRODUODENOSCOPY (EGD) WITH PROPOFOL;  Surgeon: Eloise Harman, DO;  [facility];  Service: Endoscopy;  Laterality: N/A;  am or early PM due to givens capsule placement   GIVENS CAPSULE STUDY N/A 05/18/2020   Procedure: [place];  Surgeon: Harvel Quale, MD;  [facility];  Service: Gastroenterology;  Laterality: N/A;   GIVENS CAPSULE STUDY N/A 07/28/2020   Procedure: GIVENS CAPSULE STUDY;  Surgeon: Eloise Harman, DO;  [facility];  Service: Endoscopy;  Laterality: N/A;   POLYPECTOMY  01/25/2020   Procedure: POLYPECTOMY;  Surgeon: Eloise Harman, DO;  [facility];  Service: Endoscopy;;   POLYPECTOMY  04/07/2020   Procedure: POLYPECTOMY INTESTINAL;  Surgeon: Eloise Harman, DO;  [facility];  Service: Endoscopy;;   SKIN CANCER EXCISION     Face   SPINE SURGERY     SUBMUCOSAL TATTOO INJECTION  01/25/2020   Procedure: SUBMUCOSAL TATTOO INJECTION;  Surgeon: Carver, Charles K, DO;  [facility];  Service: Endoscopy;;      Allergies: Keflex [cephalexin]  Medications: Prior to Admission medications    Medication Sig Start Date End Date Taking? Authorizing Provider  Alcohol Swabs (B-D SINGLE USE SWABS REGULAR) PADS Check BS daily Dx E11.9 09/01/21  Yes Gottschalk, Ashly M, DO  Blood Glucose Calibration (TRUE METRIX LEVEL 1) Low SOLN Use w/ glucose monitor Dx E11.9 09/01/21  Yes Gottschalk, Ashly M, DO  Blood Glucose Monitoring Suppl (TRUE METRIX AIR GLUCOSE METER) w/Device KIT Check BS daily Dx E11.9 09/01/21  Yes Gottschalk, Ashly M, DO  Cholecalciferol (VITAMIN D) 50 MCG (2000 UT) tablet Take 4,000 Units by mouth daily.   Yes [provider]  esomeprazole (NEXIUM) 20 MG capsule Take 1 capsule (20 mg total) by mouth daily at 12 noon. 12/09/20  Yes Gottschalk, Ashly M, DO  furosemide (LASIX) 20 MG tablet Take 20 mg by mouth. bid   Yes [provider]  glucose blood (TRUE METRIX BLOOD GLUCOSE TEST) test strip Check BS daily Dx E11.9 09/01/21  Yes Gottschalk, Ashly M, DO  lovastatin (MEVACOR) 20 MG tablet Take 1 tablet (20 mg total) by mouth at bedtime. 10/30/19  Yes Gottschalk, Ashly M, DO  oxyCODONE-acetaminophen (PERCOCET) 10-325 MG tablet Take 1 tablet by mouth every 6 (six) hours as needed for pain.   Yes [provider]  OXYGEN Inhale 3 L into the lungs continuous.   Yes [provider]  potassium chloride SA (KLOR-CON) 20 MEQ tablet Take 2 tablets (40 mEq total) by mouth 2 (two) times daily. Patient taking differently: Take 40 mEq by mouth daily. 04/24/20  Yes Gottschalk, Ashly M, DO  TRUEplus Lancets 33G MISC Check BS daily Dx E11.9 09/01/21  Yes Gottschalk, Ashly M, DO  bismuth-metronidazole-tetracycline (PYLERA) 140-125-125 MG capsule Take by mouth. 08/05/21 08/15/21  [provider]  Cyanocobalamin (VITAMIN B-12 IJ) Inject 1,000 mcg as directed every 30 (thirty) days.    [provider]  desvenlafaxine (PRISTIQ) 50 MG 24 hr tablet Take 1 tablet (50 mg total) by mouth daily. 04/14/21   Gottschalk, Ashly M, DO  ferrous sulfate 325 (65 FE) MG tablet  Take 1 tablet (325 mg total) by mouth 2 (two) times daily with a meal. Patient taking differently: Take 325 mg by mouth daily with breakfast. 06/25/20 01/08/21  Gottschalk, Ashly M, DO  omeprazole (PRILOSEC) 20 MG capsule Take 20 mg by mouth 2 (two) times daily. 08/16/21   [provider]  Semaglutide,0.25 or 0.5MG/DOS, (OZEMPIC, 0.25 OR 0.5 MG/DOSE,) 2 MG/3ML SOPN Inject 0.25 mg into the skin every 7 (seven) days for 28 days, THEN 0.5 mg every 7 (seven) days. 08/27/21 11/19/21  Gottschalk, Ashly M, DO  sulfamethoxazole-trimethoprim (BACTRIM DS) 800-160 MG tablet Take 1 tablet by mouth 2 (two) times daily. 10/19/21   Ijaola, Onyeje M, NP  torsemide (DEMADEX) 20 MG tablet Take 3 tablets (60 mg total) by mouth 2 (two) times daily. Patient taking differently: Take 50 mg by mouth 2 (two) times daily. 02/18/20 01/08/21  Lenze, Michele M, PA-C  albuterol (PROVENTIL HFA;VENTOLIN HFA) 108 (90 Base) MCG/ACT inhaler Inhale 2 puffs into the lungs every 6 (six) hours as needed for wheezing or shortness of breath. 09/21/16 09/22/16  Phelps, Jazma Y, DO       Vital Signs: BP (!) 159/88   Pulse 90   Temp 97.9 F (36.6 C) (Oral)   Resp 18   Ht 5' 2" (1.575 m)   Wt 295 lb (133.8 kg)   SpO2 98%   BMI 53.96 kg/m   Physical Exam awake, alert.  Chest clear to auscultation bilaterally.  Heart with regular rate and rhythm, positive murmur.  Abdomen obese, soft, positive bowel sounds, nontender; 2+ bilateral pretibial edema.  Imaging: No results found.  Labs:  CBC: Recent Labs    10/08/21 0812 10/15/21 0819 10/22/21 0806 11/04/21 1118  WBC 5.6 4.5 3.4* 5.0  HGB 10.4* 10.3* 10.1* 9.9*  HCT 34.3* 34.1* 33.3* 32.3*  PLT 150 125* 128* 123*    COAGS: No results for input(s): "INR", "APTT" in the last 8760 hours.  BMP: Recent Labs    03/24/21 1006 06/25/21 0828 08/13/21 0942 10/22/21 0806  NA 137 136 138 136  K 3.7 3.8 3.9 3.7  CL 104 103 104 104  CO2 _0 GLUCOSE 150* 173* 211*  136*  BUN _1 CALCIUM 8.5* 8.1* 8.2* 8.5*  CREATININE 0.62 0.59 0.66 0.66  GFRNONAA >60 >60 >60 >60    LIVER FUNCTION TESTS: Recent Labs    03/24/21 1006 06/25/21 0828 08/13/21 0942 10/22/21 0806  BILITOT 0.1* 0.4 0.3 0.5  AST _2 ALT _3 ALKPHOS 82 84 90 89  PROT 8.1 8.2* 8.2* 8.8*  ALBUMIN 2.9* 2.9* 2.8* 3.0*    Assessment and Plan: Patient known to IR service from bone marrow biopsy in 2022.  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 Additional medical history includes anemia, CHF, cirrhosis, obesity, COPD, depression, diabetes, hyperlipidemia, hypertension, risks and benefits of procedure was discussed with the patient  including, but not limited to bleeding, infection, damage to adjacent structures or low yield requiring additional tests.  All of the questions were answered and there is agreement to proceed.  Consent signed and in chart.    Electronically Signed: D. Rowe Robert, PA-C 11/19/2021, 8:04 AM   I spent a total of 20 minutes at the the patient's bedside AND on the patient's hospital floor or unit, greater than 50% of which was counseling/coordinating care for CT-guided bone marrow biopsy

## 2021-11-19 NOTE — Procedures (Signed)
  Procedure:  CT bone marrow biopsy R iliac Preprocedure diagnosis: Diagnoses of Multiple myeloma not having achieved remission (HCC) and Multiple myeloma without remission ([place]) were pertinent to this visit.  Postprocedure diagnosis: same EBL:    minimal Complications:   none immediate  See full dictation in [facility].  Dillard Cannon MD Main # [phone] Pager  [phone] Mobile [phone]

## 2021-11-19 NOTE — Discharge Instructions (Signed)
Please call Interventional Radiology clinic [phone] with any questions or concerns.  You may remove your dressing and shower tomorrow.    Bone Marrow Aspiration and Bone Marrow Biopsy, Adult, Care After This sheet gives you information about how to care for yourself after your procedure. Your health care provider may also give you more specific instructions. If you have problems or questions, contact your health careprovider. What can I expect after the procedure? After the procedure, it is common to have: Mild pain and tenderness. Swelling. Bruising. Follow these instructions at home: Puncture site care Follow instructions from your health care provider about how to take care of the puncture site. Make sure you: Wash your hands with soap and water before and after you change your bandage (dressing). If soap and water are not available, use hand sanitizer. Change your dressing as told by your health care provider. Check your puncture site every day for signs of infection. Check for: More redness, swelling, or pain. Fluid or blood. Warmth. Pus or a bad smell.  Activity Return to your normal activities as told by your health care provider. Ask your health care provider what activities are safe for you. Do not lift anything that is heavier than 10 lb (4.5 kg), or the limit that you are told, until your health care provider says that it is safe. Do not drive for 24 hours if you were given a sedative during your procedure. General instructions Take over-the-counter and prescription medicines only as told by your health care provider. Do not take baths, swim, or use a hot tub until your health care provider approves. Ask your health care provider if you may take showers. You may only be allowed to take sponge baths. If directed, put ice on the affected area. To do this: Put ice in a plastic bag. Place a towel between your skin and the bag. Leave the ice on for 20 minutes, 2-3 times a  day. Keep all follow-up visits as told by your health care provider. This is important.  Contact a health care provider if: Your pain is not controlled with medicine. You have a fever. You have more redness, swelling, or pain around the puncture site. You have fluid or blood coming from the puncture site. Your puncture site feels warm to the touch. You have pus or a bad smell coming from the puncture site. Summary After the procedure, it is common to have mild pain, tenderness, swelling, and bruising. Follow instructions from your health care provider about how to take care of the puncture site and what activities are safe for you. Take over-the-counter and prescription medicines only as told by your health care provider. Contact a health care provider if you have any signs of infection, such as fluid or blood coming from the puncture site. This information is not intended to replace advice given to you by your health care provider. Make sure you discuss any questions you have with your healthcare provider. Document Revised: 07/31/2018 Document Reviewed: 07/31/2018 Elsevier Patient Education  2022 [employer]    Moderate Conscious Sedation, Adult, Care After This sheet gives you information about how to care for yourself after your procedure. Your health care provider may also give you more specific instructions. If you have problems or questions, contact your health careprovider. What can I expect after the procedure? After the procedure, it is common to have: Sleepiness for several hours. Impaired judgment for several hours. Difficulty with balance. Vomiting if you eat too soon. Follow these   home: For the time period you were told by your health care provider: Rest. Do not participate in activities where you could fall or become injured. Do not drive or use machinery. Do not drink alcohol. Do not take sleeping pills or medicines that cause drowsiness. Do not make  important decisions or sign legal documents. Do not take care of children on your own. Eating and drinking  Follow the diet recommended by your health care provider. Drink enough fluid to keep your urine pale yellow. If you vomit: Drink water, juice, or soup when you can drink without vomiting. Make sure you have little or no nausea before eating solid foods.  General instructions Take over-the-counter and prescription medicines only as told by your health care provider. Have a responsible adult stay with you for the time you are told. It is important to have someone help care for you until you are awake and alert. Do not smoke. Keep all follow-up visits as told by your health care provider. This is important. Contact a health care provider if: You are still sleepy or having trouble with balance after 24 hours. You feel light-headed. You keep feeling nauseous or you keep vomiting. You develop a rash. You have a fever. You have redness or swelling around the IV site. Get help right away if: You have trouble breathing. You have new-onset confusion at home. Summary After the procedure, it is common to feel sleepy, have impaired judgment, or feel nauseous if you eat too soon. Rest after you get home. Know the things you should not do after the procedure. Follow the diet recommended by your health care provider and drink enough fluid to keep your urine pale yellow. Get help right away if you have trouble breathing or new-onset confusion at home. This information is not intended to replace advice given to you by your health care provider. Make sure you discuss any questions you have with your healthcare provider. Document Revised: 07/12/2019 Document Reviewed: 02/07/2019 Elsevier Patient Education  2022 [employer].

## 2021-11-22 ENCOUNTER — Other Ambulatory Visit: Payer: Self-pay | Admitting: Physician Assistant

## 2021-11-22 ENCOUNTER — Encounter: Payer: Self-pay | Admitting: Physician Assistant

## 2021-11-22 DIAGNOSIS — D500 Iron deficiency anemia secondary to blood loss (chronic): Secondary | ICD-10-CM

## 2021-11-22 DIAGNOSIS — C9000 Multiple myeloma not having achieved remission: Secondary | ICD-10-CM

## 2021-11-22 DIAGNOSIS — J189 Pneumonia, unspecified organism: Secondary | ICD-10-CM

## 2021-11-22 MED ORDER — LEVOFLOXACIN 750 MG PO TABS: 750.0000 mg | ORAL_TABLET | Freq: Every day | ORAL | 0 refills | Status: DC

## 2021-11-22 NOTE — Progress Notes (Signed)
PET scan from 11/18/2021 showed nodular airspace disease in the right upper lobe and perihilar left upper lobe, suggestive of pulmonary infection including atypical infection.  Per discussion with the [facility], prescription sent to patient's pharmacy for Levaquin 750 mg x 7 days for empiric treatment of community-acquired pneumonia.  MyChart message sent to patient.  LPN has called patient (left voicemail) to inform her of these findings.  If patient has any worsening respiratory symptoms or fever, she should seek medical care immediately.  Otherwise, patient is scheduled for follow-up visit with Dr. Delton Coombes on 12/01/2021 to discuss these and other findings.

## 2021-11-23 ENCOUNTER — Ambulatory Visit: Payer: Medicare PPO | Admitting: Hematology

## 2021-11-23 ENCOUNTER — Encounter (HOSPITAL_COMMUNITY): Payer: Self-pay | Admitting: Hematology

## 2021-11-23 LAB — SURGICAL PATHOLOGY

## 2021-11-23 NOTE — Telephone Encounter (Signed)
Tried to call patient again, still unable to reach.  Encounter closed.

## 2021-11-24 ENCOUNTER — Encounter (HOSPITAL_COMMUNITY): Payer: Self-pay

## 2021-11-24 ENCOUNTER — Other Ambulatory Visit: Payer: Medicare PPO

## 2021-11-30 ENCOUNTER — Encounter: Payer: Self-pay | Admitting: Hematology

## 2021-11-30 ENCOUNTER — Inpatient Hospital Stay: Payer: Medicare PPO

## 2021-11-30 ENCOUNTER — Inpatient Hospital Stay: Payer: Medicare PPO | Attending: Hematology | Admitting: Hematology

## 2021-11-30 VITALS — BP 142/87 | HR 101 | Temp 99.0°F | Resp 19 | Ht 62.0 in | Wt 297.1 lb

## 2021-11-30 DIAGNOSIS — D696 Thrombocytopenia, unspecified: Secondary | ICD-10-CM | POA: Insufficient documentation

## 2021-11-30 DIAGNOSIS — C9000 Multiple myeloma not having achieved remission: Secondary | ICD-10-CM | POA: Diagnosis not present

## 2021-11-30 DIAGNOSIS — R0602 Shortness of breath: Secondary | ICD-10-CM | POA: Insufficient documentation

## 2021-11-30 DIAGNOSIS — K922 Gastrointestinal hemorrhage, unspecified: Secondary | ICD-10-CM | POA: Insufficient documentation

## 2021-11-30 DIAGNOSIS — M129 Arthropathy, unspecified: Secondary | ICD-10-CM | POA: Insufficient documentation

## 2021-11-30 DIAGNOSIS — Z87891 Personal history of nicotine dependence: Secondary | ICD-10-CM | POA: Insufficient documentation

## 2021-11-30 DIAGNOSIS — K5900 Constipation, unspecified: Secondary | ICD-10-CM | POA: Insufficient documentation

## 2021-11-30 DIAGNOSIS — E119 Type 2 diabetes mellitus without complications: Secondary | ICD-10-CM | POA: Diagnosis not present

## 2021-11-30 DIAGNOSIS — K7460 Unspecified cirrhosis of liver: Secondary | ICD-10-CM | POA: Insufficient documentation

## 2021-11-30 DIAGNOSIS — Z800 Family history of malignant neoplasm of digestive organs: Secondary | ICD-10-CM | POA: Diagnosis not present

## 2021-11-30 DIAGNOSIS — Z79899 Other long term (current) drug therapy: Secondary | ICD-10-CM | POA: Diagnosis not present

## 2021-11-30 DIAGNOSIS — D3502 Benign neoplasm of left adrenal gland: Secondary | ICD-10-CM | POA: Insufficient documentation

## 2021-11-30 DIAGNOSIS — E785 Hyperlipidemia, unspecified: Secondary | ICD-10-CM | POA: Diagnosis not present

## 2021-11-30 DIAGNOSIS — I10 Essential (primary) hypertension: Secondary | ICD-10-CM | POA: Diagnosis not present

## 2021-11-30 DIAGNOSIS — D500 Iron deficiency anemia secondary to blood loss (chronic): Secondary | ICD-10-CM

## 2021-11-30 DIAGNOSIS — Z85828 Personal history of other malignant neoplasm of skin: Secondary | ICD-10-CM | POA: Diagnosis not present

## 2021-11-30 DIAGNOSIS — Z8041 Family history of malignant neoplasm of ovary: Secondary | ICD-10-CM | POA: Diagnosis not present

## 2021-11-30 DIAGNOSIS — Z87442 Personal history of urinary calculi: Secondary | ICD-10-CM | POA: Insufficient documentation

## 2021-11-30 LAB — CBC WITH DIFFERENTIAL/PLATELET
Abs Immature Granulocytes: 0.02 10*3/uL (ref 0.00–0.07)
Basophils Absolute: 0 10*3/uL (ref 0.0–0.1)
Basophils Relative: 0 %
Eosinophils Absolute: 0 10*3/uL (ref 0.0–0.5)
Eosinophils Relative: 1 %
HCT: 29.1 % — ABNORMAL LOW (ref 36.0–46.0)
Hemoglobin: 9.1 g/dL — ABNORMAL LOW (ref 12.0–15.0)
Immature Granulocytes: 0 %
Lymphocytes Relative: 18 %
Lymphs Abs: 0.9 10*3/uL (ref 0.7–4.0)
MCH: 29.9 pg (ref 26.0–34.0)
MCHC: 31.3 g/dL (ref 30.0–36.0)
MCV: 95.7 fL (ref 80.0–100.0)
Monocytes Absolute: 0.5 10*3/uL (ref 0.1–1.0)
Monocytes Relative: 10 %
Neutro Abs: 3.7 10*3/uL (ref 1.7–7.7)
Neutrophils Relative %: 71 %
Platelets: 125 10*3/uL — ABNORMAL LOW (ref 150–400)
RBC: 3.04 MIL/uL — ABNORMAL LOW (ref 3.87–5.11)
RDW: 17.2 % — ABNORMAL HIGH (ref 11.5–15.5)
WBC: 5.2 10*3/uL (ref 4.0–10.5)
nRBC: 0 % (ref 0.0–0.2)

## 2021-11-30 LAB — IRON AND TIBC
Iron: 28 ug/dL (ref 28–170)
Saturation Ratios: 8 % — ABNORMAL LOW (ref 10.4–31.8)
TIBC: 356 ug/dL (ref 250–450)
UIBC: 328 ug/dL

## 2021-11-30 LAB — SAMPLE TO BLOOD BANK

## 2021-11-30 LAB — FERRITIN: Ferritin: 13 ng/mL (ref 11–307)

## 2021-11-30 MED ORDER — ACYCLOVIR 400 MG PO TABS: 400.0000 mg | ORAL_TABLET | Freq: Two times a day (BID) | ORAL | 6 refills | Status: DC

## 2021-11-30 NOTE — Progress Notes (Signed)
START ON PATHWAY REGIMEN - Multiple Myeloma and Other Plasma Cell Dyscrasias     Cycle 1 and 2: A cycle is every 28 days:     Lenalidomide      Dexamethasone      Daratumumab    Cycles 3 through 6: A cycle is every 28 days:     Lenalidomide      Dexamethasone      Daratumumab    Cycles 7 and beyond: A cycle is every 28 days:     Lenalidomide      Dexamethasone      Daratumumab   **Always confirm dose/schedule in your pharmacy ordering system**  Patient Characteristics: Multiple Myeloma, Newly Diagnosed, Transplant Ineligible or Refused, Standard Risk Disease Classification: Multiple Myeloma R-ISS Staging: II Therapeutic Status: Newly Diagnosed Is Patient Eligible for Transplant<= Transplant Ineligible or Refused Risk Status: Standard Risk Intent of Therapy: Non-Curative / Palliative Intent, Discussed with Patient

## 2021-11-30 NOTE — Progress Notes (Signed)
CONSULT NOTE  Patient Care Team: [name], DO as PCP - General (Family Medicine) Harl Bowie Alphonse Guild, MD as PCP - Cardiology (Cardiology) Zonia Kief, MD (Rehabilitation) Chesley Mires, MD as Consulting Physician (Pulmonary Disease) Eloise Harman, DO as Consulting Physician (Internal Medicine) Lavera Guise, [facility] as Schaller Management (Pharmacist) Jeanella Anton, NP as Nurse Practitioner (Nurse Practitioner) Roselyn Reef, MD as Referring Physician (Gastroenterology) Derek Jack, MD as Medical Oncologist (Medical Oncology) Brien Mates, RN as Oncology Nurse Navigator (Medical Oncology)  CHIEF COMPLAINTS/PURPOSE OF CONSULTATION:  Multiple myeloma  HISTORY OF PRESENTING ILLNESS:  Savannah Burns 71 y.o. female is seen in consultation today for further management of newly diagnosed multiple myeloma.  She has been seen in our clinic for anemia from iron deficiency from blood loss.  Further work-up showed M spike and elevated light chains.  She had further testing with bone marrow biopsy and PET CT scan.  She reports energy levels are 30%.  Reports some shortness of breath with exertion.  Also has some constipation.  Denies any new onset bone pains.   MEDICAL HISTORY:  Past Medical History:  Diagnosis Date   Adrenal adenoma, left    Stable   Anxiety    Arthritis    bilateral hands   Depression    Diabetes mellitus, type 2 ([place]) 08/12/2008   Qualifier: Diagnosis of  By: Deborra Medina MD, Talia     Dyspnea    Esophageal varices (HCC)    Grade II diastolic dysfunction    History of kidney stones    Hyperlipidemia    Hypertension    Lower back pain    Lower GI bleed 03/19/2020   Panic attacks    Pneumonia    currently taking antibiotic and prednisone for early stages of pneumonia   Pulmonary nodules    bilateral   Skin cancer    face    SURGICAL HISTORY: Past Surgical History:  Procedure Laterality Date   BIOPSY  04/07/2020    Procedure: BIOPSY;  Surgeon: Eloise Harman, DO;  [facility];  Service: Endoscopy;;   Breast Cystectomy  Right    CESAREAN SECTION     COLONOSCOPY WITH PROPOFOL N/A 01/25/2020   Dr. Abbey Chatters: Nonbleeding internal hemorrhoids, diverticulosis, 5 mm polyp removed from the ascending colon, 10 mm polyp removed from the sigmoid colon, 30 mm polyp (tubulovillous adenoma with no high-grade dysplasia) removed from the transverse colon via piecemeal status post tattoo.  Other polyps were tubular adenomas.  3 month surveillance colonoscopy recommended.   COLONOSCOPY WITH PROPOFOL N/A 04/07/2020   Procedure: COLONOSCOPY WITH PROPOFOL;  Surgeon: Eloise Harman, DO;  [facility];  Service: Endoscopy;  Laterality: N/A;  3:00pm, pt knows new time per office   CYSTOSCOPY/URETEROSCOPY/HOLMIUM LASER/STENT PLACEMENT Bilateral 03/01/2019   Procedure: CYSTOSCOPY/RETROGRADEURETEROSCOPY/HOLMIUM LASER/STENT PLACEMENT;  Surgeon: Ceasar Mons, MD;  Location[facility];  Service: Urology;  Laterality: Bilateral;  ONLY NEEDS 60 MIN   ESOPHAGOGASTRODUODENOSCOPY (EGD) WITH PROPOFOL N/A 01/25/2020   Dr. Abbey Chatters: 4 columns grade 1 esophageal varices   ESOPHAGOGASTRODUODENOSCOPY (EGD) WITH PROPOFOL N/A 05/18/2020   Procedure: ESOPHAGOGASTRODUODENOSCOPY (EGD) WITH PROPOFOL;  Surgeon: Eloise Harman, DO;  [facility];  Service: Endoscopy;  Laterality: N/A;   ESOPHAGOGASTRODUODENOSCOPY (EGD) WITH PROPOFOL N/A 07/28/2020   Procedure: ESOPHAGOGASTRODUODENOSCOPY (EGD) WITH PROPOFOL;  Surgeon: Eloise Harman, DO;  [facility];  Service: Endoscopy;  Laterality: N/A;  am or early PM due to givens  capsule placement   GIVENS CAPSULE STUDY N/A 05/18/2020   Procedure: GIVENS CAPSULE STUDY;  Surgeon: Harvel Quale, MD;  [facility];  Service: Gastroenterology;  Laterality: N/A;   GIVENS CAPSULE STUDY N/A 07/28/2020   Procedure: GIVENS CAPSULE STUDY;  Surgeon:  Eloise Harman, DO;  [facility];  Service: Endoscopy;  Laterality: N/A;   POLYPECTOMY  01/25/2020   Procedure: POLYPECTOMY;  Surgeon: Eloise Harman, DO;  [facility];  Service: Endoscopy;;   POLYPECTOMY  04/07/2020   Procedure: POLYPECTOMY INTESTINAL;  Surgeon: Eloise Harman, DO;  [facility];  Service: Endoscopy;;   SKIN CANCER EXCISION     Face   SPINE SURGERY     SUBMUCOSAL TATTOO INJECTION  01/25/2020   Procedure: SUBMUCOSAL TATTOO INJECTION;  Surgeon: Eloise Harman, DO;  [facility];  Service: Endoscopy;;    SOCIAL HISTORY: Social History   Socioeconomic History   Marital status: Widowed    Spouse name: Not on file   Number of children: 2   Years of education: 14   Highest education level: Not on file  Occupational History   Occupation: Retired   Tobacco Use   Smoking status: Former    Packs/day: 1.50    Years: 40.00    Total pack years: 60.00    Types: Cigarettes    Quit date: 04/29/2015    Years since quitting: 6.5   Smokeless tobacco: Never   Tobacco comments:    Quit smoking 04/2015- Previous 1.5 ppd smoker  Vaping Use   Vaping Use: Never used  Substance and Sexual Activity   Alcohol use: No    Alcohol/week: 0.0 standard drinks of alcohol   Drug use: No   Sexual activity: Not Currently    Birth control/protection: Post-menopausal  Other Topics Concern   Not on file  Social History Narrative   Her 68 year old granddaughter lives with her - one daughter lives nearby, but she doesn't have a good relationship with her. Has a great relationship with other daughter who lives 1.5 hrs away - talks to her daily on the phone.   Social Determinants of Health   Financial Resource Strain: Medium Risk (07/27/2021)   Overall Financial Resource Strain (CARDIA)    Difficulty of Paying Living Expenses: Somewhat hard  Food Insecurity: Food Insecurity Present (07/27/2021)   Hunger Vital Sign    Worried About Running  Out of Food in the Last Year: Sometimes true    Ran Out of Food in the Last Year: Never true  Transportation Needs: No Transportation Needs (07/27/2021)   PRAPARE - Hydrologist (Medical): No    Lack of Transportation (Non-Medical): No  Physical Activity: Inactive (07/27/2021)   Exercise Vital Sign    Days of Exercise per Week: 0 days    Minutes of Exercise per Session: 0 min  Stress: Stress Concern Present (07/27/2021)   [place]    Feeling of Stress : To some extent  Social Connections: Socially Isolated (07/27/2021)   Social Connection and Isolation Panel [NHANES]    Frequency of Communication with Friends and Family: More than three times a week    Frequency of Social Gatherings with Friends and Family: Once a week    Attends Religious Services: Never    Marine scientist or Organizations: No    Attends Archivist Meetings: Never    Marital  Status: Widowed  Intimate Partner Violence: Not At Risk (07/27/2021)   Humiliation, Afraid, Rape, and Kick questionnaire    Fear of Current or Ex-Partner: No    Emotionally Abused: No    Physically Abused: No    Sexually Abused: No    FAMILY HISTORY: Family History  Problem Relation Age of Onset   Diabetes Father    Heart disease Father 21       MI   Hypertension Father    Anemia Mother        Transfusion dependent   COPD Sister    Cancer Paternal Grandmother 85       Pancreatic    ALLERGIES:  is allergic to keflex [cephalexin].  MEDICATIONS:  Current Outpatient Medications  Medication Sig Dispense Refill   acyclovir (ZOVIRAX) 400 MG tablet Take 1 tablet (400 mg total) by mouth 2 (two) times daily. 60 tablet 6   Alcohol Swabs (B-D SINGLE USE SWABS REGULAR) PADS Check BS daily Dx E11.9 100 each 3   Blood Glucose Calibration (TRUE METRIX LEVEL 1) Low SOLN Use w/ glucose monitor Dx E11.9 3 each 0   Blood Glucose Monitoring Suppl  (TRUE METRIX AIR GLUCOSE METER) w/Device KIT Check BS daily Dx E11.9 1 kit 0   Cholecalciferol (VITAMIN D) 50 MCG (2000 UT) tablet Take 4,000 Units by mouth daily.     CONSTULOSE 10 GM/15ML solution Take by mouth.     Cyanocobalamin (VITAMIN B-12 IJ) Inject 1,000 mcg as directed every 30 (thirty) days.     desvenlafaxine (PRISTIQ) 50 MG 24 hr tablet Take 1 tablet (50 mg total) by mouth daily. 90 tablet 3   esomeprazole (NEXIUM) 20 MG capsule Take 1 capsule (20 mg total) by mouth daily at 12 noon. 90 capsule 3   furosemide (LASIX) 20 MG tablet Take 20 mg by mouth. bid     glucose blood (TRUE METRIX BLOOD GLUCOSE TEST) test strip Check BS daily Dx E11.9 100 each 3   levofloxacin (LEVAQUIN) 750 MG tablet Take 1 tablet (750 mg total) by mouth daily. 7 tablet 0   lovastatin (MEVACOR) 20 MG tablet Take 1 tablet (20 mg total) by mouth at bedtime. 90 tablet 3   omeprazole (PRILOSEC) 20 MG capsule Take 20 mg by mouth 2 (two) times daily.     oxyCODONE-acetaminophen (PERCOCET) 10-325 MG tablet Take 1 tablet by mouth every 6 (six) hours as needed for pain.     OXYGEN Inhale 3 L into the lungs continuous.     potassium chloride SA (KLOR-CON) 20 MEQ tablet Take 2 tablets (40 mEq total) by mouth 2 (two) times daily. (Patient taking differently: Take 40 mEq by mouth daily.) 360 tablet 3   TRUEplus Lancets 33G MISC Check BS daily Dx E11.9 100 each 3   ferrous sulfate 325 (65 FE) MG tablet Take 1 tablet (325 mg total) by mouth 2 (two) times daily with a meal. (Patient taking differently: Take 325 mg by mouth daily with breakfast.) 60 tablet 1   torsemide (DEMADEX) 20 MG tablet Take 3 tablets (60 mg total) by mouth 2 (two) times daily. (Patient taking differently: Take 50 mg by mouth 2 (two) times daily.) 180 tablet 11   No current facility-administered medications for this visit.    REVIEW OF SYSTEMS:   Constitutional: Denies fevers, chills or abnormal night sweats Eyes: Denies blurriness of vision, double  vision or watery eyes Ears, nose, mouth, throat, and face: Denies mucositis or sore throat Respiratory: Positive for dyspnea on  exertion. Cardiovascular: Denies palpitation, chest discomfort or lower extremity swelling Gastrointestinal: Positive for constipation. Skin: Denies abnormal skin rashes Lymphatics: Denies new lymphadenopathy or easy bruising Neurological:Denies numbness, tingling or new weaknesses Behavioral/Psych: Mood is stable, no new changes  All other systems were reviewed with the patient and are negative.  PHYSICAL EXAMINATION: ECOG PERFORMANCE STATUS: 1 - Symptomatic but completely ambulatory  Vitals:   11/30/21 1342  BP: (!) 142/87  Pulse: (!) 101  Resp: 19  Temp: 99 F (37.2 C)  SpO2: 97%   Filed Weights   11/30/21 1342  Weight: 297 lb 1.6 oz (134.8 kg)    GENERAL:alert, no distress and comfortable SKIN: skin color, texture, turgor are normal, no rashes or significant lesions EYES: normal, conjunctiva are pink and non-injected, sclera clear OROPHARYNX:no exudate, no erythema and lips, buccal mucosa, and tongue normal  NECK: supple, thyroid normal size, non-tender, without nodularity LYMPH:  no palpable lymphadenopathy in the cervical, axillary or inguinal LUNGS: clear to auscultation and percussion with normal breathing effort HEART: Regular rate and rhythm.  2+ edema bilaterally. ABDOMEN:abdomen soft, non-tender and normal bowel sounds Musculoskeletal:no cyanosis of digits and no clubbing  PSYCH: alert & oriented x 3 with fluent speech NEURO: no focal motor/sensory deficits  LABORATORY DATA:  I have reviewed the data as listed Recent Results (from the past 2160 hour(s))  Sample to Blood Bank(Blood Bank Hold)     Status: None   Collection Time: 09/03/21  8:29 AM  Result Value Ref Range   Blood Bank Specimen SAMPLE AVAILABLE FOR TESTING    Sample Expiration      09/04/2021,2359 Performed at [facility], [address], [place], [place] [address]    CBC     Status: Abnormal   Collection Time: 09/03/21  8:29 AM  Result Value Ref Range   WBC 5.3 4.0 - 10.5 K/uL   RBC 2.62 (L) 3.87 - 5.11 MIL/uL   Hemoglobin 6.9 (LL) 12.0 - 15.0 g/dL    Comment: REPEATED TO VERIFY THIS CRITICAL RESULT HAS VERIFIED AND BEEN CALLED TO [facility] BY LATISHA HENDERSON ON 06 09 2023 AT 0903, AND HAS BEEN READ BACK.     HCT 24.3 (L) 36.0 - 46.0 %   MCV 92.7 80.0 - 100.0 fL   MCH 26.3 26.0 - 34.0 pg   MCHC 28.4 (L) 30.0 - 36.0 g/dL   RDW 18.9 (H) 11.5 - 15.5 %   Platelets 107 (L) 150 - 400 K/uL    Comment: SPECIMEN CHECKED FOR CLOTS Immature Platelet Fraction may be clinically indicated, consider ordering this additional test [id number] REPEATED TO VERIFY PLATELET COUNT CONFIRMED BY SMEAR    nRBC 0.0 0.0 - 0.2 %    Comment: Performed at [facility], [address], [place], [place] [address]  Prepare RBC (crossmatch)     Status: None   Collection Time: 09/03/21  8:43 AM  Result Value Ref Range   Order Confirmation      ORDER PROCESSED BY BLOOD BANK Performed at [facility], [address], [place], [place] [address]   Type and screen     Status: None   Collection Time: 09/03/21  8:43 AM  Result Value Ref Range   ABO/RH(D) A POS    Antibody Screen NEG    Sample Expiration 09/06/2021,2359    Unit Number [id number]    Blood Component Type RED CELLS,LR    Unit division 00    Status of Unit ISSUED,FINAL    Transfusion Status OK TO TRANSFUSE  Crossmatch Result      Compatible Performed at [facility], [address], [place], [place] [address]   BPAM RBC     Status: None   Collection Time: 09/03/21  8:43 AM  Result Value Ref Range   ISSUE DATE / TIME [id number]    Blood Product Unit Number [id number]    PRODUCT CODE [id number]    Unit Type and Rh 6200    Blood Product Expiration Date [id number]   Sample to Blood Bank(Blood Bank Hold)     Status: None   Collection Time: 09/10/21  8:16 AM  Result Value Ref Range   Blood Bank  Specimen SAMPLE AVAILABLE FOR TESTING    Sample Expiration      09/11/2021,2359 Performed at [facility], [address], [place], [place] [address]   CBC     Status: Abnormal   Collection Time: 09/10/21  8:16 AM  Result Value Ref Range   WBC 4.5 4.0 - 10.5 K/uL   RBC 3.47 (L) 3.87 - 5.11 MIL/uL   Hemoglobin 9.2 (L) 12.0 - 15.0 g/dL   HCT 32.1 (L) 36.0 - 46.0 %   MCV 92.5 80.0 - 100.0 fL   MCH 26.5 26.0 - 34.0 pg   MCHC 28.7 (L) 30.0 - 36.0 g/dL   RDW 19.8 (H) 11.5 - 15.5 %   Platelets 125 (L) 150 - 400 K/uL    Comment: Immature Platelet Fraction may be clinically indicated, consider ordering this additional test [id number]    nRBC 0.0 0.0 - 0.2 %    Comment: Performed at [facility], [address], [place], [place] [address]  CBC     Status: Abnormal   Collection Time: 09/17/21  8:21 AM  Result Value Ref Range   WBC 4.3 4.0 - 10.5 K/uL   RBC 3.37 (L) 3.87 - 5.11 MIL/uL   Hemoglobin 9.0 (L) 12.0 - 15.0 g/dL   HCT 30.7 (L) 36.0 - 46.0 %   MCV 91.1 80.0 - 100.0 fL   MCH 26.7 26.0 - 34.0 pg   MCHC 29.3 (L) 30.0 - 36.0 g/dL   RDW 19.0 (H) 11.5 - 15.5 %   Platelets 157 150 - 400 K/uL   nRBC 0.0 0.0 - 0.2 %    Comment: Performed at [facility], [address] [place], [place] [address]  Sample to Blood Bank(Blood Bank Hold)     Status: None   Collection Time: 09/17/21  8:22 AM  Result Value Ref Range   Blood Bank Specimen SAMPLE AVAILABLE FOR TESTING    Sample Expiration      09/18/2021,2359 Performed at [facility], [address] [place], [place] [address]   Iron and TIBC     Status: Abnormal   Collection Time: 09/17/21  8:23 AM  Result Value Ref Range   Iron 31 28 - 170 ug/dL   TIBC 382 250 - 450 ug/dL   Saturation Ratios 8 (L) 10.4 - 31.8 %   UIBC 351 ug/dL    Comment: Performed at [facility], [address] [place], [place] [address]  Ferritin     Status: None   Collection Time: 09/17/21  8:23 AM  Result Value Ref Range   Ferritin 28 11 - 307 ng/mL     Comment: Performed at [facility], [address], [place], [place] [address]  CBC     Status: Abnormal   Collection Time: 09/24/21  8:02 AM  Result Value Ref Range   WBC 3.9 (L) 4.0 -  10.5 K/uL   RBC 3.48 (L) 3.87 - 5.11 MIL/uL   Hemoglobin 9.3 (L) 12.0 - 15.0 g/dL   HCT 32.6 (L) 36.0 - 46.0 %   MCV 93.7 80.0 - 100.0 fL   MCH 26.7 26.0 - 34.0 pg   MCHC 28.5 (L) 30.0 - 36.0 g/dL   RDW 19.9 (H) 11.5 - 15.5 %   Platelets 121 (L) 150 - 400 K/uL   nRBC 0.0 0.0 - 0.2 %    Comment: Performed at [facility], [address] [place], [place] [address]  Sample to Blood Bank(Blood Bank Hold)     Status: None   Collection Time: 09/24/21  8:03 AM  Result Value Ref Range   Blood Bank Specimen SAMPLE AVAILABLE FOR TESTING    Sample Expiration      09/25/2021,2359 Performed at [facility], [address], [place], [place] [address]   CBC     Status: Abnormal   Collection Time: 10/08/21  8:12 AM  Result Value Ref Range   WBC 5.6 4.0 - 10.5 K/uL   RBC 3.74 (L) 3.87 - 5.11 MIL/uL   Hemoglobin 10.4 (L) 12.0 - 15.0 g/dL   HCT 34.3 (L) 36.0 - 46.0 %   MCV 91.7 80.0 - 100.0 fL   MCH 27.8 26.0 - 34.0 pg   MCHC 30.3 30.0 - 36.0 g/dL   RDW 20.4 (H) 11.5 - 15.5 %   Platelets 150 150 - 400 K/uL   nRBC 0.0 0.0 - 0.2 %    Comment: Performed at [facility], [address] [place], [place] [address]  Sample to Blood Bank(Blood Bank Hold)     Status: None   Collection Time: 10/08/21  8:12 AM  Result Value Ref Range   Blood Bank Specimen SAMPLE AVAILABLE FOR TESTING    Sample Expiration      10/09/2021,2359 Performed at [facility], [address] [place], [place] [address]   CBC with Differential/Platelet     Status: Abnormal   Collection Time: 10/15/21  8:19 AM  Result Value Ref Range   WBC 4.5 4.0 - 10.5 K/uL   RBC 3.66 (L) 3.87 - 5.11 MIL/uL   Hemoglobin 10.3 (L) 12.0 - 15.0 g/dL   HCT 34.1 (L) 36.0 - 46.0 %   MCV 93.2 80.0 - 100.0 fL   MCH 28.1 26.0 - 34.0 pg   MCHC 30.2 30.0 - 36.0 g/dL    RDW 20.4 (H) 11.5 - 15.5 %   Platelets 125 (L) 150 - 400 K/uL    Comment: SPECIMEN CHECKED FOR CLOTS Immature Platelet Fraction may be clinically indicated, consider ordering this additional test [id number] REPEATED TO VERIFY    nRBC 0.0 0.0 - 0.2 %   Neutrophils Relative % 66 %   Neutro Abs 2.9 1.7 - 7.7 K/uL   Lymphocytes Relative 24 %   Lymphs Abs 1.1 0.7 - 4.0 K/uL   Monocytes Relative 9 %   Monocytes Absolute 0.4 0.1 - 1.0 K/uL   Eosinophils Relative 1 %   Eosinophils Absolute 0.0 0.0 - 0.5 K/uL   Basophils Relative 0 %   Basophils Absolute 0.0 0.0 - 0.1 K/uL   Immature Granulocytes 0 %   Abs Immature Granulocytes 0.02 0.00 - 0.07 K/uL    Comment: Performed at [facility], [address] [place], [place] [address]  Sample to Blood Bank(Blood Bank Hold)     Status: None   Collection Time: 10/15/21  8:19 AM  Result Value Ref Range  Blood Bank Specimen SAMPLE AVAILABLE FOR TESTING    Sample Expiration      10/16/2021,2359 Performed at [facility], [address], [place], [place] [address]   CULTURE, URINE COMPREHENSIVE     Status: Abnormal   Collection Time: 10/19/21 12:23 PM   Specimen: Urine   UR  Result Value Ref Range   Urine Culture, Comprehensive Final report (A)    Organism ID, Bacteria Comment (A)     Comment: Escherichia coli, identified by an automated biochemical system. Greater than 100,000 colony forming units per mL Cefazolin <=4 ug/mL Cefazolin with an MIC <=16 predicts susceptibility to the oral agents cefaclor, cefdinir, cefpodoxime, cefprozil, cefuroxime, cephalexin, and loracarbef when used for therapy of uncomplicated urinary tract infections due to E. coli, Klebsiella pneumoniae, and Proteus mirabilis.    ANTIMICROBIAL SUSCEPTIBILITY Comment     Comment:       ** S = Susceptible; I = Intermediate; R = Resistant **                    P = Positive; N = Negative             MICS are expressed in micrograms per mL    Antibiotic                  RSLT#1    RSLT#2    RSLT#3    RSLT#4 Amoxicillin/Clavulanic Acid    S Ampicillin                     S Cefepime                       S Ceftriaxone                    S Cefuroxime                     S Ciprofloxacin                  S Ertapenem                      S Gentamicin                     S Imipenem                       S Levofloxacin                   S Meropenem                      S Nitrofurantoin                 S Piperacillin/Tazobactam        S Tetracycline                   S Tobramycin                     S Trimethoprim/Sulfa             S   Urinalysis, Routine w reflex microscopic     Status: Abnormal   Collection Time: 10/19/21 12:23 PM  Result Value Ref Range   Specific Gravity, UA 1.015 1.005 - 1.030   pH, UA 7.5 5.0 - 7.5   Color, UA Yellow Yellow  Appearance Ur Cloudy (A) Clear   Leukocytes,UA 3+ (A) Negative   Protein,UA 2+ (A) Negative/Trace   Glucose, UA Negative Negative   Ketones, UA Negative Negative   RBC, UA 3+ (A) Negative   Bilirubin, UA Negative Negative   Urobilinogen, Ur 1.0 0.2 - 1.0 mg/dL   Nitrite, UA Negative Negative   Microscopic Examination See below:   Microscopic Examination     Status: Abnormal   Collection Time: 10/19/21 12:23 PM   Urine  Result Value Ref Range   WBC, UA >30 (A) 0 - 5 /hpf   RBC, Urine >30 (A) 0 - 2 /hpf   Epithelial Cells (non renal) 0-10 0 - 10 /hpf   Renal Epithel, UA None seen None seen /hpf   Bacteria, UA Many (A) None seen/Few  Sample to Blood Bank(Blood Bank Hold)     Status: None   Collection Time: 10/22/21  8:06 AM  Result Value Ref Range   Blood Bank Specimen SAMPLE AVAILABLE FOR TESTING    Sample Expiration      10/23/2021,2359 Performed at [facility], [address], [place], [place] [address]   CBC with Differential/Platelet     Status: Abnormal   Collection Time: 10/22/21  8:06 AM  Result Value Ref Range   WBC 3.4 (L) 4.0 - 10.5 K/uL   RBC 3.55 (L) 3.87 - 5.11 MIL/uL   Hemoglobin  10.1 (L) 12.0 - 15.0 g/dL   HCT 33.3 (L) 36.0 - 46.0 %   MCV 93.8 80.0 - 100.0 fL   MCH 28.5 26.0 - 34.0 pg   MCHC 30.3 30.0 - 36.0 g/dL   RDW 20.4 (H) 11.5 - 15.5 %   Platelets 128 (L) 150 - 400 K/uL   nRBC 0.0 0.0 - 0.2 %   Neutrophils Relative % 62 %   Neutro Abs 2.1 1.7 - 7.7 K/uL   Lymphocytes Relative 25 %   Lymphs Abs 0.8 0.7 - 4.0 K/uL   Monocytes Relative 11 %   Monocytes Absolute 0.4 0.1 - 1.0 K/uL   Eosinophils Relative 1 %   Eosinophils Absolute 0.0 0.0 - 0.5 K/uL   Basophils Relative 0 %   Basophils Absolute 0.0 0.0 - 0.1 K/uL   Immature Granulocytes 1 %   Abs Immature Granulocytes 0.02 0.00 - 0.07 K/uL    Comment: Performed at [facility], [address], [place], [place] [address]  Comprehensive metabolic panel     Status: Abnormal   Collection Time: 10/22/21  8:06 AM  Result Value Ref Range   Sodium 136 135 - 145 mmol/L   Potassium 3.7 3.5 - 5.1 mmol/L   Chloride 104 98 - 111 mmol/L   CO2 27 22 - 32 mmol/L   Glucose, Bld 136 (H) 70 - 99 mg/dL    Comment: Glucose reference range applies only to samples taken after fasting for at least 8 hours.   BUN 16 8 - 23 mg/dL   Creatinine, Ser 0.66 0.44 - 1.00 mg/dL   Calcium 8.5 (L) 8.9 - 10.3 mg/dL   Total Protein 8.8 (H) 6.5 - 8.1 g/dL   Albumin 3.0 (L) 3.5 - 5.0 g/dL   AST 21 15 - 41 U/L   ALT 19 0 - 44 U/L   Alkaline Phosphatase 89 38 - 126 U/L   Total Bilirubin 0.5 0.3 - 1.2 mg/dL   GFR, Estimated >60 >60 mL/min    Comment: (NOTE) Calculated using the CKD-EPI Creatinine Equation (2021)    Anion gap 5  5 - 15    Comment: Performed at [facility], [address], [place], [place] [address]  Lactate dehydrogenase     Status: Abnormal   Collection Time: 10/22/21  8:06 AM  Result Value Ref Range   LDH 92 (L) 98 - 192 U/L    Comment: Performed at [facility], [address], [place], [place] [address]  Kappa/lambda light chains     Status: Abnormal   Collection Time: 10/22/21  8:06 AM  Result Value Ref Range    Kappa free light chain 139.7 (H) 3.3 - 19.4 mg/L   Lambda free light chains 10.7 5.7 - 26.3 mg/L   Kappa, lambda light chain ratio 13.06 (H) 0.26 - 1.65    Comment: (NOTE) Performed At: [facility] [place], [place] [id number] Rush Farmer MD [id number]   Protein electrophoresis, serum     Status: Abnormal   Collection Time: 10/22/21  8:06 AM  Result Value Ref Range   Total Protein ELP 8.4 6.0 - 8.5 g/dL   Albumin ELP 3.2 2.9 - 4.4 g/dL   Alpha-1-Globulin 0.2 0.0 - 0.4 g/dL   Alpha-2-Globulin 0.7 0.4 - 1.0 g/dL   Beta Globulin 0.9 0.7 - 1.3 g/dL   Gamma Globulin 3.4 (H) 0.4 - 1.8 g/dL   M-Spike, % 3.1 (H) Not Observed g/dL   SPE Interp. Comment     Comment: (NOTE) The SPE pattern demonstrates a single peak (M-spike) in the gamma region which may represent monoclonal protein. This peak may also be caused by circulating immune complexes, cryoglobulins, C-reactive protein, fibrinogen or hemolysis.  If clinically indicated, the presence of a monoclonal gammopathy may be confirmed by immuno- fixation, as well as an evaluation of the urine for the presence of Bence-Jones protein. Performed At: [facility] [place], [place] [id number] Rush Farmer MD [id number]    Comment Comment     Comment: (NOTE) Protein electrophoresis scan will follow via computer, mail, or courier delivery.    Globulin, Total 5.2 (H) 2.2 - 3.9 g/dL   A/G Ratio 0.6 (L) 0.7 - 1.7  Iron and TIBC     Status: None   Collection Time: 10/22/21  8:06 AM  Result Value Ref Range   Iron 63 28 - 170 ug/dL   TIBC 344 250 - 450 ug/dL   Saturation Ratios 18 10.4 - 31.8 %   UIBC 281 ug/dL    Comment: Performed at [facility], [address] [place], [place] [address]  Ferritin     Status: None   Collection Time: 10/22/21  8:06 AM  Result Value Ref Range   Ferritin 91 11 - 307 ng/mL    Comment: Performed at [facility], [address] [place], [place] [address]   Beta 2 microglobulin, serum     Status: Abnormal   Collection Time: 11/04/21 11:18 AM  Result Value Ref Range   Beta-2 Microglobulin 2.6 (H) 0.6 - 2.4 mg/L    Comment: (NOTE) Siemens Immulite 2000 Immunochemiluminometric assay (ICMA) Values obtained with different assay methods or kits cannot be used interchangeably. Results cannot be interpreted as absolute evidence of the presence or absence of malignant disease. Performed At: [facility] [place], [place] [id number] Rush Farmer MD OV:[id number]   Sample to Blood Bank(Blood Bank Hold)     Status: None   Collection Time: 11/04/21 11:18 AM  Result Value Ref Range   Blood Bank Specimen SAMPLE AVAILABLE FOR TESTING    Sample Expiration  11/05/2021,2359 Performed at [facility], [address] [place], [place] [address]   CBC     Status: Abnormal   Collection Time: 11/04/21 11:18 AM  Result Value Ref Range   WBC 5.0 4.0 - 10.5 K/uL   RBC 3.37 (L) 3.87 - 5.11 MIL/uL   Hemoglobin 9.9 (L) 12.0 - 15.0 g/dL   HCT 32.3 (L) 36.0 - 46.0 %   MCV 95.8 80.0 - 100.0 fL   MCH 29.4 26.0 - 34.0 pg   MCHC 30.7 30.0 - 36.0 g/dL   RDW 19.9 (H) 11.5 - 15.5 %   Platelets 123 (L) 150 - 400 K/uL   nRBC 0.0 0.0 - 0.2 %    Comment: Performed at [facility], [address] [place], [place] [address]  Surgical pathology     Status: None   Collection Time: 11/19/21 12:00 AM  Result Value Ref Range   SURGICAL PATHOLOGY      Surgical Pathology CASE: [id number] PATIENT: Savannah Burns Bone Marrow Report     Clinical History: Multiple Myeloma not having achieved remission  ([place]) (BH)     DIAGNOSIS:  BONE MARROW, ASPIRATE, CLOT, CORE: -  Plasma cell myeloma involving approximately 40% of the cellular marrow, see note.  PERIPHERAL BLOOD: -  Normochromic normocytic anemia and thrombocytopenia  Note: The clinical history of a smoldering myeloma at is noted.  The current biopsy by immunohistochemistry  shows an interstitial to nodular to focally sheets of plasma cells comprising approximately 40% of the overall marrow (best seen on the clot section by IHC.  Findings are consistent with plasma cell myeloma.  The hypercellularity is interpreted as secondary to the patient's plasma cell neoplasm; however, the megakaryocytic hyperplasia with focal clustering, while possibly secondary to a compensatory hyperplasia from a possible peripheral platelet destruction, and other underlying  myeloproliferative/myelodysplastic syndrome cannot be completely excluded.  Close clinical follow-up/correlation after treatment for the patient's known plasma cell neoplasm may be helpful to differentiate. If clinically indicated a myeloid NexGen panel could also be considered.  MICROSCOPIC DESCRIPTION:  PERIPHERAL BLOOD SMEAR: The peripheral blood smear and indices are reviewed revealing a normochromic normocytic anemia and mild thrombocytopenia.  The overall morphologic review is unremarkable with pertinent negatives including negative for rouleaux and circulating plasma cells.  The platelet count is consistent with the smear without satellitosis or clumping.  There are scattered large/giant platelets. The white blood cells are unremarkable.  BONE MARROW ASPIRATE: The bone marrow aspirate smear slides contain several cellular bone marrow spicules which are adequate for interpretation Erythroid precursors: The erythroid series is present in adequate number with a  slight left shift but otherwise orderly maturation and unremarkable morphology. Granulocytic precursors: Myeloid series present in adequate number with orderly maturation and unremarkable morphology.  Megakaryocytes: Megakaryocytes are present in increased number with focal clustering but with otherwise unremarkable morphology. Lymphocytes/plasma cells: Plasma cells are present in increased number (23% on a 500 cell count) with focal  clustering.  The plasma cells are mature in appearance with occasional binucleated forms identified.  TOUCH PREPARATIONS: The touch prep shows similar morphology to that seen in the aspirate smear slide, see above  CLOT AND BIOPSY: The decalcified bone marrow biopsy consists of a fragmented core of periosteum cortical and minimal trabecular bone with inadequate marrow having a cellularity of approximately 75% which is hypercellular for age especially given the subcortical location.  The myeloid and erythroid series are present in adequate  number with orderly maturation and unremarkable morphology.  The megakaryocytes  are present in slightly increased number (approximately 6-7/hpf but with otherwise unremarkable morphology.  Scattered plasma cells are identified but they are not readily identifiable on the HE stained slide.  The clot section shows abundant fragments of hematopoietic marrow with a cellularity of approximately 60 to 80% with an overall average of 70%. Myeloid and erythroid series are present in adequate to slightly increased number but with otherwise orderly maturation and unremarkable morphology.  The megakaryocytes are present in increased number (up to 9/hpf) but with overall unremarkable morphology.  Focally plasma cells are seen to be present and clusters, see immunohistochemical stains below.  IMMUNOHISTOCHEMICAL STAINS: Immunohistochemical stains were performed on the clot section as well as a single immunohistochemical stain on the biopsy with appropriate controls.  CD138 and MUM1  highlight abundant plasma cells in both an interstitial and clusters with focal sheets and a much greater number that identified easily by HE stained slide.  The overall percentage is interpreted as 40%.  The plasma cells are kappa restricted by kappa/lambda ISH  IRON STAIN: Iron stains are performed on a bone marrow aspirate or touch imprint smear and section of clot. The controls  stained appropriately.       Storage Iron: Adequate histiocytic iron stores      Ring Sideroblasts: Not identified  ADDITIONAL DATA/TESTING: Conventional cytogenetics and a FISH myeloma prognostic panel are pending and will be reported in an addendum.  CELL COUNT DATA:  Bone Marrow count performed on 500 cells shows: Blasts:   1%   Myeloid:  42% Promyelocytes: 0%   Erythroid:     21% Myelocytes:    5%   Lymphocytes:   12% Metamyelocytes:     5%   Plasma cells:  23% Bands:    13% Neutrophils:   17%  M:E ratio:     2.0 Eosinophils:   1% Basophils:     0% Monocytes:     2%  Lab Data : CBC performed on 11/19/2021 shows: WBC: 4.6 k/uL  Neutrophils:   63%  Bands:       2% Hgb: 9.4 g/dL  Lymphocytes:   26% HCT: 30.8 %    Monocytes:     8% MCV: 97.2 fL   Eosinophils:   1% RDW: 18.5 %    Basophils:     0% PLT: 123 k/uL    GROSS DESCRIPTION:  A.  BM-aspirate smear  B.  Received in B-plus fixative is a 1.8 x 0.2 x 0.2 cm aggregate of tissue.  Submitted entirely in B1.  C.  Received in B-plus fixative are 2 cores of firm bone measuring 1.0 and 1.2 cm in length and each measuring 0.2 cm in diameter.  Submitted entirely in C1, following decalcification in Immunocal. (KW, 11/19/2021)   Final Diagnosis performed by Tilford Pillar DO.   Electronically signed 11/23/2021 Technical and / or Professional components performed at [facility], [place] [address], [place], [place] [address].  Immunohistochemistry Technical component (if applicable) was performed at [facility]. [address], [address], [place], [place] [address].   IMMUNOHISTOCHEMISTRY DISCLAIMER (if applicable): Some of these immunohistochemical stains may have been developed and the performance characteristics determine by [facility] Some may not have been cleared or approved by the U.S. Food and Drug Administration. The FDA has determined that such clearance or  approval is not necessary. This test is used for clinical purposes. It should not be regarded as investigational or for research. This laboratory is certified  under the Clinical Laboratory Improvement Amendments of 1988 (CLIA-88) as qualified to perform high complexity clinical laboratory testing.  The controls stained appropriately.   Glucose, capillary     Status: Abnormal   Collection Time: 11/19/21  7:17 AM  Result Value Ref Range   Glucose-Capillary 143 (H) 70 - 99 mg/dL    Comment: Glucose reference range applies only to samples taken after fasting for at least 8 hours.   Comment 1 Notify RN   CBC with Differential/Platelet     Status: Abnormal   Collection Time: 11/19/21  7:50 AM  Result Value Ref Range   WBC 4.6 4.0 - 10.5 K/uL   RBC 3.17 (L) 3.87 - 5.11 MIL/uL   Hemoglobin 9.4 (L) 12.0 - 15.0 g/dL   HCT 30.8 (L) 36.0 - 46.0 %   MCV 97.2 80.0 - 100.0 fL   MCH 29.7 26.0 - 34.0 pg   MCHC 30.5 30.0 - 36.0 g/dL   RDW 18.5 (H) 11.5 - 15.5 %   Platelets 123 (L) 150 - 400 K/uL   nRBC 0.0 0.0 - 0.2 %   Neutrophils Relative % 64 %   Neutro Abs 2.9 1.7 - 7.7 K/uL   Lymphocytes Relative 27 %   Lymphs Abs 1.2 0.7 - 4.0 K/uL   Monocytes Relative 9 %   Monocytes Absolute 0.4 0.1 - 1.0 K/uL   Eosinophils Relative 0 %   Eosinophils Absolute 0.0 0.0 - 0.5 K/uL   Basophils Relative 0 %   Basophils Absolute 0.0 0.0 - 0.1 K/uL   Immature Granulocytes 0 %   Abs Immature Granulocytes 0.01 0.00 - 0.07 K/uL    Comment: Performed at [facility], [place] [address] [place], [place] [address]  Sample to Blood Bank     Status: None   Collection Time: 11/30/21 12:59 PM  Result Value Ref Range   Blood Bank Specimen SAMPLE AVAILABLE FOR TESTING    Sample Expiration      12/01/2021,2359 Performed at [facility], [address], [place], [place] [address]   CBC with Differential/Platelet     Status: Abnormal   Collection Time: 11/30/21 12:59 PM  Result Value Ref Range   WBC  5.2 4.0 - 10.5 K/uL   RBC 3.04 (L) 3.87 - 5.11 MIL/uL   Hemoglobin 9.1 (L) 12.0 - 15.0 g/dL   HCT 29.1 (L) 36.0 - 46.0 %   MCV 95.7 80.0 - 100.0 fL   MCH 29.9 26.0 - 34.0 pg   MCHC 31.3 30.0 - 36.0 g/dL   RDW 17.2 (H) 11.5 - 15.5 %   Platelets 125 (L) 150 - 400 K/uL   nRBC 0.0 0.0 - 0.2 %   Neutrophils Relative % 71 %   Neutro Abs 3.7 1.7 - 7.7 K/uL   Lymphocytes Relative 18 %   Lymphs Abs 0.9 0.7 - 4.0 K/uL   Monocytes Relative 10 %   Monocytes Absolute 0.5 0.1 - 1.0 K/uL   Eosinophils Relative 1 %   Eosinophils Absolute 0.0 0.0 - 0.5 K/uL   Basophils Relative 0 %   Basophils Absolute 0.0 0.0 - 0.1 K/uL   Immature Granulocytes 0 %   Abs Immature Granulocytes 0.02 0.00 - 0.07 K/uL    Comment: Performed at [facility], [address], [place], [place] [address]  Ferritin     Status: None   Collection Time: 11/30/21 12:59 PM  Result Value Ref Range   Ferritin 13 11 - 307 ng/mL    Comment:  Performed at [facility], [address], [place], [place] [address]  Iron and TIBC     Status: Abnormal   Collection Time: 11/30/21 12:59 PM  Result Value Ref Range   Iron 28 28 - 170 ug/dL   TIBC 356 250 - 450 ug/dL   Saturation Ratios 8 (L) 10.4 - 31.8 %   UIBC 328 ug/dL    Comment: Performed at [facility], [address], [place], [place] [address]    RADIOGRAPHIC STUDIES: I have personally reviewed the radiological images as listed and agreed with the findings in the report. NM PET Image Initial (PI) Whole Body  Result Date: 11/19/2021 CLINICAL DATA:  Subsequent treatment strategy for multiple myeloma. EXAM: NUCLEAR MEDICINE PET WHOLE BODY TECHNIQUE: 16.0 mCi F-18 FDG was injected intravenously. Full-ring PET imaging was performed from the head to foot after the radiotracer. CT data was obtained and used for attenuation correction and anatomic localization. Fasting blood glucose: 152 mg/dl COMPARISON:  None Available. FINDINGS: Mediastinal blood pool activity: SUV max 2.1 HEAD/NECK: No  hypermetabolic activity in the scalp. No hypermetabolic cervical lymph nodes. Incidental CT findings: none CHEST: No hypermetabolic mediastinal or hilar nodes. Focus nodular airspace disease is segmental pattern in the RIGHT upper lobe (35/series 7). This nodularity has mild metabolic activity SUV max equal 3.1. Additional focus of airspace disease in the perihilar LEFT lung also has a nodular component and with mild metabolic activity (SUV max equal 3.1 (image 122 fused data set) No hypermetabolic mediastinal nodes. Incidental CT findings: none ABDOMEN/PELVIS: No abnormal hypermetabolic activity within the liver, pancreas, adrenal glands, or spleen. No hypermetabolic lymph nodes in the abdomen or pelvis. Normal spleen.  No plasmacytoma. Incidental CT findings: none SKELETON: No focal radiotracer activity within the skeleton to suggest active multiple myeloma. Degenerative uptake in the RIGHT shoulder. Incidental CT findings: No suspicious lesion on the CT portion exam (no lytic or sclerotic lesion identified). Posterior lumbar fusion noted. EXTREMITIES: No abnormal hypermetabolic activity in the lower extremities. Incidental CT findings: No suspicious lytic lesion. IMPRESSION: 1. No evidence of active myeloma on whole-body FDG PET scan. 2. No suspicious lytic lesions within the skeleton identified. 3. No plasmacytoma. 4. Nodular airspace disease in the RIGHT upper lobe and perihilar LEFT upper lobe. Findings most suggestive of pulmonary infection including atypical infection. Myeloma would be much less favored. Recommend clinical correlation with pulmonary infection and recommend follow-up CT scan in 6 to 8 weeks to demonstrate resolution. These results will be called to the ordering clinician or representative by the Radiologist Assistant, and communication documented in the PACS or [employer]. Electronically Signed   By: Suzy Bouchard M.D.   On: 11/19/2021 16:51   CT BONE MARROW BIOPSY &  ASPIRATION  Result Date: 11/19/2021 CLINICAL DATA:  Multiple myeloma, follow-up EXAM: CT GUIDED DEEP ILIAC BONE ASPIRATION AND CORE BIOPSY TECHNIQUE: Patient was placed prone on the CT gantry and limited axial scans through the pelvis were obtained. This exam was performed according to the departmental dose-optimization program which includes automated exposure control, adjustment of the mA and/or kV according to patient size and/or use of iterative reconstruction technique. Appropriate skin entry site was identified. Skin site was marked, prepped with chlorhexidine, draped in usual sterile fashion, and infiltrated locally with 1% lidocaine. Intravenous Fentanyl 111mg and Versed 291mwere administered as conscious sedation during continuous monitoring of the patient's level of consciousness and physiological / cardiorespiratory status by the radiology RN, with a total moderate sedation time of 15 minutes. Under  CT fluoroscopic guidance an 11-gauge Cook trocar bone needle was advanced into the right iliac bone just lateral to the sacroiliac joint. Once needle tip position was confirmed, core and aspiration samples were obtained, submitted to pathology for approval. Patient tolerated procedure well. COMPLICATIONS: COMPLICATIONS none IMPRESSION: 1. Technically successful CT guided right iliac bone core and aspiration biopsy. Electronically Signed   By: Lucrezia Europe M.D.   On: 11/19/2021 11:12    ASSESSMENT:  1.  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  Orderly maturation of erythroid and myeloid series. - Myeloma FISH panel: t(11;14) - Cytogenetics: Failed to grow metaphases - PET scan (11/18/2021): No evidence of active myeloma or plasmacytoma.  Right upper lobe lung infection. - Skull x-rays (10/29/2021) multiple discrete round lucent/lytic lesions in the skull - Worsening M spike and free light chain ratio.  Normal  creatinine and calcium.  Multifactorial anemia including blood loss and bone marrow infiltration.  2.  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  Quit smoking 15 years ago.  No exposure to chemicals or pesticides. - Paternal grandmother had ovarian cancer.  Sister had ovarian cancer.   PLAN:  1.  Stage II IgG kappa myeloma, standard risk: - We talked about her new diagnosis in detail. - Based on her performance status and comorbidities, I do not believe she is a candidate for bone marrow transplant. - Hence we discussed treatment regimen for transplant ineligible patients with daratumumab, lenalidomide and dexamethasone. - We discussed side effects in detail.  Literature was given to the patient. - As she has problems with access, recommend port placement. - We will start at Revlimid 15 mg 21 days on/7 days off.  We will give her dexamethasone 20 mg weekly.  If there is any problem with the sugars, we will cut it back to 10 mg weekly. - We will check baseline hemoglobin A1c. - Tentatively will start treatment in 2 weeks.  RTC 4 weeks to evaluate for toxicities.  2.  Iron deficiency anemia from chronic GI bleed: - Recommend aggressive repletion of iron once every 2 to 4 weeks. - Latest CBC shows hemoglobin 9.1, ferritin 13.  Recommend Venofer x3. - Continue B12 injection monthly.  3.  Infection prophylaxis: - We will start acyclovir 400 mg twice daily. - We will hold off on aspirin 81 mg for thromboprophylaxis due to history of GI bleeds.  4.  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  Seen by transplant hepatology [facility] at [facility]. - Currently on Lasix 20 mg daily as needed and lactulose 30 mL daily, which she has not started taking it.  She was also reportedly started on carvedilol 6.25 mg twice daily, which she is taking once daily.   All questions were  answered. The patient knows to call the clinic with any problems, questions or concerns.      Derek Jack, MD 11/30/21 4:56 PM

## 2021-11-30 NOTE — Progress Notes (Signed)
DISCONTINUE ON PATHWAY REGIMEN - Multiple Myeloma and Other Plasma Cell Dyscrasias     Cycle 1 and 2: A cycle is every 28 days:     Lenalidomide      Dexamethasone      Daratumumab    Cycles 3 through 6: A cycle is every 28 days:     Lenalidomide      Dexamethasone      Daratumumab    Cycles 7 and beyond: A cycle is every 28 days:     Lenalidomide      Dexamethasone      Daratumumab   **Always confirm dose/schedule in your pharmacy ordering system**  REASON: Other Reason PRIOR TREATMENT: [id number]: DaraRd (Daratumumab 16 mg/kg IV + Lenalidomide + Dexamethasone 40 mg PO/IV) q28 Days Until Progression or Unacceptable Toxicity  START ON PATHWAY REGIMEN - Multiple Myeloma and Other Plasma Cell Dyscrasias     Cycles 1 and 2: A cycle is every 28 days:     Lenalidomide      Dexamethasone      Daratumumab and hyaluronidase-fihj    Cycles 3 through 6: A cycle is every 28 days:     Lenalidomide      Dexamethasone      Daratumumab and hyaluronidase-fihj    Cycles 7 and beyond: A cycle is every 28 days:     Lenalidomide      Dexamethasone      Daratumumab and hyaluronidase-fihj   **Always confirm dose/schedule in your pharmacy ordering system**  Patient Characteristics: Multiple Myeloma, Newly Diagnosed, Transplant Ineligible or Refused, Standard Risk Disease Classification: Multiple Myeloma R-ISS Staging: II Therapeutic Status: Newly Diagnosed Is Patient Eligible for Transplant<= Transplant Ineligible or Refused Risk Status: Standard Risk Intent of Therapy: Non-Curative / Palliative Intent, Discussed with Patient

## 2021-11-30 NOTE — Progress Notes (Signed)
I met with the patient and her granddaughter today during and following initial visit with Dr. Delton Coombes. I provided my contact information and encouraged the patient and family to call with questions or concerns. Written information on Daratumumab, Lenalidomide and Dexamethasone as discussed by Dr. Delton Coombes.

## 2021-11-30 NOTE — Patient Instructions (Addendum)
[facility]  Discharge Instructions  You were seen and examined today by Dr. Katragadda.   Dr. Katragadda discussed the results of your recent PET scan and bone marrow biopsy.  You have been diagnosed with Standard Risk Multiple Myeloma. Multiple Myeloma is a type of blood cancer. It is not a curable cancer, but it can be well controlled with treatment.  Dr. Katragadda has recommended a combination therapy of three drugs known as Daratumumab, Lenalidomide and Dexamethasone. Dr. Katragadda will also monitor your iron levels with a goal of keeping your Hemoglobin above 10.  Follow-up as scheduled.   Thank you for choosing [facility] to provide your oncology and hematology care.   To afford each patient quality time with our provider, please arrive at least 15 minutes before your scheduled appointment time. You may need to reschedule your appointment if you arrive late (10 or more minutes). Arriving late affects you and other patients whose appointments are after yours.  Also, if you miss three or more appointments without notifying the office, you may be dismissed from the clinic at the provider's discretion.    Again, thank you for choosing [facility].  Our hope is that these requests will decrease the amount of time that you wait before being seen by our physicians.   If you have a lab appointment with the Cancer Center please come in thru the Main Entrance and check in at the main information desk.           _____________________________________________________________  Should you have questions after your visit to  Cancer Center, please contact our office at [phone] and follow the prompts.  Our office hours are 8:00 a.m. to 4:30 p.m. Monday - Thursday and 8:00 a.m. to 2:30 p.m. Friday.  Please note that voicemails left after 4:00 p.m. may not be returned until the following business day.  We are closed  weekends and all major holidays.  You do have access to a nurse 24-7, just call the main number to the clinic [phone] and do not press any options, hold on the line and a nurse will answer the phone.    For prescription refill requests, have your pharmacy contact our office and allow 72 hours.    Masks are optional in the cancer centers. If you would like for your care team to wear a mask while they are taking care of you, please let them know. You may have one support person who is at least 72 years old accompany you for your appointments.  

## 2021-12-01 ENCOUNTER — Other Ambulatory Visit: Payer: Self-pay | Admitting: Radiology

## 2021-12-01 ENCOUNTER — Other Ambulatory Visit: Payer: Self-pay

## 2021-12-01 ENCOUNTER — Encounter (HOSPITAL_COMMUNITY): Payer: Self-pay | Admitting: Physician Assistant

## 2021-12-01 DIAGNOSIS — Z79899 Other long term (current) drug therapy: Secondary | ICD-10-CM | POA: Diagnosis not present

## 2021-12-01 DIAGNOSIS — F1120 Opioid dependence, uncomplicated: Secondary | ICD-10-CM | POA: Diagnosis not present

## 2021-12-01 DIAGNOSIS — R030 Elevated blood-pressure reading, without diagnosis of hypertension: Secondary | ICD-10-CM | POA: Diagnosis not present

## 2021-12-01 DIAGNOSIS — Z6841 Body Mass Index (BMI) 40.0 and over, adult: Secondary | ICD-10-CM | POA: Diagnosis not present

## 2021-12-01 DIAGNOSIS — G894 Chronic pain syndrome: Secondary | ICD-10-CM | POA: Diagnosis not present

## 2021-12-01 DIAGNOSIS — E119 Type 2 diabetes mellitus without complications: Secondary | ICD-10-CM | POA: Diagnosis not present

## 2021-12-01 DIAGNOSIS — M5136 Other intervertebral disc degeneration, lumbar region: Secondary | ICD-10-CM | POA: Diagnosis not present

## 2021-12-02 ENCOUNTER — Encounter: Payer: Self-pay | Admitting: Hematology

## 2021-12-02 ENCOUNTER — Telehealth: Payer: Self-pay | Admitting: Pharmacy Technician

## 2021-12-02 ENCOUNTER — Other Ambulatory Visit: Payer: Self-pay

## 2021-12-02 ENCOUNTER — Other Ambulatory Visit (HOSPITAL_COMMUNITY): Payer: Self-pay

## 2021-12-02 ENCOUNTER — Telehealth (HOSPITAL_COMMUNITY): Payer: Self-pay | Admitting: Pharmacist

## 2021-12-02 ENCOUNTER — Encounter (HOSPITAL_COMMUNITY): Payer: Self-pay

## 2021-12-02 ENCOUNTER — Ambulatory Visit (HOSPITAL_COMMUNITY)
Admission: RE | Admit: 2021-12-02 | Discharge: 2021-12-02 | Disposition: A | Discharge status: Home / Self Care | Payer: Medicare PPO | Source: Ambulatory Visit | Attending: Hematology | Admitting: Hematology

## 2021-12-02 ENCOUNTER — Encounter (HOSPITAL_COMMUNITY): Payer: Self-pay | Admitting: Hematology

## 2021-12-02 DIAGNOSIS — Z452 Encounter for adjustment and management of vascular access device: Secondary | ICD-10-CM | POA: Diagnosis not present

## 2021-12-02 DIAGNOSIS — E119 Type 2 diabetes mellitus without complications: Secondary | ICD-10-CM | POA: Diagnosis not present

## 2021-12-02 DIAGNOSIS — C9000 Multiple myeloma not having achieved remission: Secondary | ICD-10-CM | POA: Diagnosis not present

## 2021-12-02 HISTORY — PX: IR IMAGING GUIDED PORT INSERTION: IMG5740

## 2021-12-02 LAB — GLUCOSE, CAPILLARY: Glucose-Capillary: 122 mg/dL — ABNORMAL HIGH (ref 70–99)

## 2021-12-02 MED ORDER — MIDAZOLAM HCL 2 MG/2ML IJ SOLN
INTRAMUSCULAR | Status: AC
  Filled 2021-12-02: qty 2

## 2021-12-02 MED ORDER — HEPARIN SOD (PORK) LOCK FLUSH 100 UNIT/ML IV SOLN
INTRAVENOUS | Status: AC
  Administered 2021-12-02: 500 [IU]
  Filled 2021-12-02: qty 5

## 2021-12-02 MED ORDER — FENTANYL CITRATE (PF) 100 MCG/2ML IJ SOLN
INTRAMUSCULAR | Status: AC
  Filled 2021-12-02: qty 2

## 2021-12-02 MED ORDER — LENALIDOMIDE 15 MG PO CAPS: 15.0000 mg | ORAL_CAPSULE | Freq: Every day | ORAL | 0 refills | Status: DC

## 2021-12-02 MED ORDER — LIDOCAINE HCL 1 % IJ SOLN
INTRAMUSCULAR | Status: AC
  Filled 2021-12-02: qty 20

## 2021-12-02 MED ORDER — MIDAZOLAM HCL 2 MG/2ML IJ SOLN
INTRAMUSCULAR | Status: AC | PRN
  Administered 2021-12-02: .5 mg via INTRAVENOUS
  Administered 2021-12-02: 1 mg via INTRAVENOUS

## 2021-12-02 MED ORDER — LIDOCAINE HCL 1 % IJ SOLN
INTRAMUSCULAR | Status: AC
  Administered 2021-12-02: 10 mL
  Filled 2021-12-02: qty 20

## 2021-12-02 MED ORDER — SODIUM CHLORIDE 0.9 % IV SOLN: INTRAVENOUS | Status: DC

## 2021-12-02 MED ORDER — FENTANYL CITRATE (PF) 100 MCG/2ML IJ SOLN
INTRAMUSCULAR | Status: AC | PRN
  Administered 2021-12-02: 25 ug via INTRAVENOUS
  Administered 2021-12-02: 50 ug via INTRAVENOUS

## 2021-12-02 NOTE — Telephone Encounter (Signed)
Revlimid prescription sent per Dr. Delton Coombes verbal order.

## 2021-12-02 NOTE — Procedures (Signed)
Interventional Radiology Procedure Note  Procedure: Placement of a right IJ approach single lumen PowerPort.  Tip is positioned at the superior cavoatrial junction and catheter is ready for immediate use.  Complications: None Recommendations:  - Ok to shower tomorrow - Do not submerge for 7 days - Routine line care   Signed,  Leshia Kope S. Rollan Roger, DO   

## 2021-12-02 NOTE — Telephone Encounter (Signed)
Oral Oncology Patient Advocate Encounter  Prior Authorization for Lenalidomide has been approved.    PA# [id number] Effective dates: 12/02/2021 through 03/27/2022  Patients co-pay is $3.92. Patient has met their annual deductible.    Lady Deutscher, CPhT-Adv Oncology Pharmacy Patient [place] Direct Number: [phone]  Fax: [phone]

## 2021-12-02 NOTE — H&P (Signed)
Chief Complaint: Patient was seen in consultation today for multiple myeloma at the request of Katragadda,Sreedhar  Referring Physician(s): Katragadda,Sreedhar  Supervising Physician: Corrie Mckusick  Patient Status: [facility] - Out-pt  History of Present Illness: Savannah Burns is a 71 y.o. female with newly diagnosed multiple myeloma followed by oncology. Pt is not a candidate for bone marrow transplant d/t comorbidities with recommendation for immunotherapy. Pt was referred by Dr. Delton Coombes for tunneled catheter with port placement d/t poor venous access.   Past Medical History:  Diagnosis Date   Adrenal adenoma, left    Stable   Anxiety    Arthritis    bilateral hands   Depression    Diabetes mellitus, type 2 ([place]) 08/12/2008   Qualifier: Diagnosis of  By: Deborra Medina MD, Talia     Dyspnea    Esophageal varices (HCC)    Grade II diastolic dysfunction    History of kidney stones    Hyperlipidemia    Hypertension    Lower back pain    Lower GI bleed 03/19/2020   Panic attacks    Pneumonia    currently taking antibiotic and prednisone for early stages of pneumonia   Pulmonary nodules    bilateral   Skin cancer    face    Past Surgical History:  Procedure Laterality Date   BIOPSY  04/07/2020   Procedure: BIOPSY;  Surgeon: Eloise Harman, DO;  [facility];  Service: Endoscopy;;   Breast Cystectomy  Right    CESAREAN SECTION     COLONOSCOPY WITH PROPOFOL N/A 01/25/2020   Dr. Abbey Chatters: Nonbleeding internal hemorrhoids, diverticulosis, 5 mm polyp removed from the ascending colon, 10 mm polyp removed from the sigmoid colon, 30 mm polyp (tubulovillous adenoma with no high-grade dysplasia) removed from the transverse colon via piecemeal status post tattoo.  Other polyps were tubular adenomas.  3 month surveillance colonoscopy recommended.   COLONOSCOPY WITH PROPOFOL N/A 04/07/2020   Procedure: COLONOSCOPY WITH PROPOFOL;  Surgeon: Eloise Harman, DO;  Location: [facility];  Service: Endoscopy;  Laterality: N/A;  3:00pm, pt knows new time per office   CYSTOSCOPY/URETEROSCOPY/HOLMIUM LASER/STENT PLACEMENT Bilateral 03/01/2019   Procedure: CYSTOSCOPY/RETROGRADEURETEROSCOPY/HOLMIUM LASER/STENT PLACEMENT;  Surgeon: Ceasar Mons, MD;  Location: [facility];  Service: Urology;  Laterality: Bilateral;  ONLY NEEDS 60 MIN   ESOPHAGOGASTRODUODENOSCOPY (EGD) WITH PROPOFOL N/A 01/25/2020   Dr. Abbey Chatters: 4 columns grade 1 esophageal varices   ESOPHAGOGASTRODUODENOSCOPY (EGD) WITH PROPOFOL N/A 05/18/2020   Procedure: ESOPHAGOGASTRODUODENOSCOPY (EGD) WITH PROPOFOL;  Surgeon: Eloise Harman, DO;  [facility];  Service: Endoscopy;  Laterality: N/A;   ESOPHAGOGASTRODUODENOSCOPY (EGD) WITH PROPOFOL N/A 07/28/2020   Procedure: ESOPHAGOGASTRODUODENOSCOPY (EGD) WITH PROPOFOL;  Surgeon: Eloise Harman, DO;  [facility];  Service: Endoscopy;  Laterality: N/A;  am or early PM due to givens capsule placement   GIVENS CAPSULE STUDY N/A 05/18/2020   Procedure: [place];  Surgeon: Harvel Quale, MD;  [facility];  Service: Gastroenterology;  Laterality: N/A;   GIVENS CAPSULE STUDY N/A 07/28/2020   Procedure: GIVENS CAPSULE STUDY;  Surgeon: Eloise Harman, DO;  [facility];  Service: Endoscopy;  Laterality: N/A;   POLYPECTOMY  01/25/2020   Procedure: POLYPECTOMY;  Surgeon: Eloise Harman, DO;  [facility];  Service: Endoscopy;;   POLYPECTOMY  04/07/2020   Procedure: POLYPECTOMY INTESTINAL;  Surgeon: Eloise Harman, DO;  [facility];  Service: Endoscopy;;  SKIN CANCER EXCISION     Face   SPINE SURGERY     SUBMUCOSAL TATTOO INJECTION  01/25/2020   Procedure: SUBMUCOSAL TATTOO INJECTION;  Surgeon: Eloise Harman, DO;  [facility];  Service: Endoscopy;;    Allergies: Keflex [cephalexin]  Medications: Prior to Admission medications   Medication Sig Start  Date End Date Taking? Authorizing Provider  Cholecalciferol (VITAMIN D) 50 MCG (2000 UT) tablet Take 2,000 Units by mouth daily.   Yes [provider]  Cyanocobalamin (B-12 COMPLIANCE INJECTION) 1000 MCG/ML KIT Inject 1,000 mcg as directed every 30 (thirty) days.   Yes [provider]  acyclovir (ZOVIRAX) 400 MG tablet Take 1 tablet (400 mg total) by mouth 2 (two) times daily. 11/30/21   Derek Jack, MD  Alcohol Swabs (B-D SINGLE USE SWABS REGULAR) PADS Check BS daily Dx E11.9 09/01/21   Ronnie Doss M, DO  Blood Glucose Calibration (TRUE METRIX LEVEL 1) Low SOLN Use w/ glucose monitor Dx E11.9 09/01/21   Ronnie Doss M, DO  Blood Glucose Monitoring Suppl (TRUE METRIX AIR GLUCOSE METER) w/Device KIT Check BS daily Dx E11.9 09/01/21   Ronnie Doss M, DO  CONSTULOSE 10 GM/15ML solution Take by mouth. 11/10/21   [provider]  Cyanocobalamin (VITAMIN B-12 IJ) Inject 1,000 mcg as directed every 30 (thirty) days.    [provider]  desvenlafaxine (PRISTIQ) 50 MG 24 hr tablet Take 1 tablet (50 mg total) by mouth daily. Patient not taking: Reported on 12/01/2021 04/14/21   [name], DO  esomeprazole (NEXIUM) 20 MG capsule Take 1 capsule (20 mg total) by mouth daily at 12 noon. 12/09/20   [name], DO  ferrous sulfate 325 (65 FE) MG tablet Take 1 tablet (325 mg total) by mouth 2 (two) times daily with a meal. Patient taking differently: Take 325 mg by mouth daily with breakfast. 06/25/20 01/08/21  [name], DO  furosemide (LASIX) 20 MG tablet Take 20 mg by mouth. bid    [provider]  glucose blood (TRUE METRIX BLOOD GLUCOSE TEST) test strip Check BS daily Dx E11.9 09/01/21   Ronnie Doss M, DO  levofloxacin (LEVAQUIN) 750 MG tablet Take 1 tablet (750 mg total) by mouth daily. 11/22/21   Harriett Rush, PA-C  lovastatin (MEVACOR) 20 MG tablet Take 1 tablet (20 mg total) by mouth at bedtime. 10/30/19    [name], DO  omeprazole (PRILOSEC) 20 MG capsule Take 20 mg by mouth 2 (two) times daily. 08/16/21   [provider]  oxyCODONE-acetaminophen (PERCOCET) 10-325 MG tablet Take 1 tablet by mouth every 6 (six) hours as needed for pain.    [provider]  OXYGEN Inhale 3 L into the lungs continuous.    [provider]  potassium chloride SA (KLOR-CON) 20 MEQ tablet Take 2 tablets (40 mEq total) by mouth 2 (two) times daily. Patient taking differently: Take 40 mEq by mouth daily. 04/24/20   [name], DO  torsemide (DEMADEX) 20 MG tablet Take 3 tablets (60 mg total) by mouth 2 (two) times daily. Patient taking differently: Take 50 mg by mouth 2 (two) times daily. 02/18/20 01/08/21  [name], PA-C  TRUEplus Lancets 33G MISC Check BS daily Dx E11.9 09/01/21   Ronnie Doss M, DO  albuterol (PROVENTIL HFA;VENTOLIN HFA) 108 (90 Base) MCG/ACT inhaler Inhale 2 puffs into the lungs every 6 (six) hours as needed for wheezing or shortness of breath. 09/21/16 09/22/16  Gerarda Fraction,  Lavonda Jumbo, DO     Family History  Problem Relation Age of Onset   Diabetes Father    Heart disease Father 37       MI   Hypertension Father    Anemia Mother        Transfusion dependent   COPD Sister    Cancer Paternal Grandmother 57       Pancreatic    Social History   Socioeconomic History   Marital status: Widowed    Spouse name: Not on file   Number of children: 2   Years of education: 14   Highest education level: Not on file  Occupational History   Occupation: Retired   Tobacco Use   Smoking status: Former    Packs/day: 1.50    Years: 40.00    Total pack years: 60.00    Types: Cigarettes    Quit date: 04/29/2015    Years since quitting: 6.6   Smokeless tobacco: Never   Tobacco comments:    Quit smoking 04/2015- Previous 1.5 ppd smoker  Vaping Use   Vaping Use: Never used  Substance and Sexual Activity   Alcohol use: No    Alcohol/week: 0.0 standard  drinks of alcohol   Drug use: No   Sexual activity: Not Currently    Birth control/protection: Post-menopausal  Other Topics Concern   Not on file  Social History Narrative   Her 51 year old granddaughter lives with her - one daughter lives nearby, but she doesn't have a good relationship with her. Has a great relationship with other daughter who lives 1.5 hrs away - talks to her daily on the phone.   Social Determinants of Health   Financial Resource Strain: Medium Risk (07/27/2021)   Overall Financial Resource Strain (CARDIA)    Difficulty of Paying Living Expenses: Somewhat hard  Food Insecurity: Food Insecurity Present (07/27/2021)   Hunger Vital Sign    Worried About Running Out of Food in the Last Year: Sometimes true    Ran Out of Food in the Last Year: Never true  Transportation Needs: No Transportation Needs (07/27/2021)   PRAPARE - Hydrologist (Medical): No    Lack of Transportation (Non-Medical): No  Physical Activity: Inactive (07/27/2021)   Exercise Vital Sign    Days of Exercise per Week: 0 days    Minutes of Exercise per Session: 0 min  Stress: Stress Concern Present (07/27/2021)   [place]    Feeling of Stress : To some extent  Social Connections: Socially Isolated (07/27/2021)   Social Connection and Isolation Panel [NHANES]    Frequency of Communication with Friends and Family: More than three times a week    Frequency of Social Gatherings with Friends and Family: Once a week    Attends Religious Services: Never    Marine scientist or Organizations: No    Attends Archivist Meetings: Never    Marital Status: Widowed    Review of Systems: A 12 point ROS discussed and pertinent positives are indicated in the HPI above.  All other systems are negative.  Review of Systems  Constitutional:  Negative for chills, fatigue and fever.  Respiratory:  Positive for  shortness of breath.   Cardiovascular:  Positive for leg swelling. Negative for chest pain.  Gastrointestinal:  Negative for abdominal pain, nausea and vomiting.  Neurological:  Negative for dizziness and weakness.    Vital Signs: BP Marland Kitchen)  160/78 (BP Location: Right Arm)   Pulse 84   Temp 98.2 F (36.8 C) (Temporal)   Resp 16   Ht 5' 2"  (1.575 m)   Wt 293 lb (132.9 kg)   SpO2 99%   BMI 53.59 kg/m    Physical Exam Vitals reviewed.  Constitutional:      General: She is not in acute distress.    Appearance: Normal appearance. She is not ill-appearing.  HENT:     Head: Normocephalic and atraumatic.     Nose:     Comments: O2 via [place]    Mouth/Throat:     Mouth: Mucous membranes are dry.     Pharynx: Oropharynx is clear.  Eyes:     Extraocular Movements: Extraocular movements intact.     Pupils: Pupils are equal, round, and reactive to light.  Cardiovascular:     Rate and Rhythm: Normal rate and regular rhythm.     Pulses: Normal pulses.     Heart sounds: Murmur heard.  Pulmonary:     Effort: Pulmonary effort is normal. No respiratory distress.     Breath sounds: Normal breath sounds. No wheezing or rhonchi.  Abdominal:     General: Bowel sounds are normal. There is distension.     Palpations: Abdomen is soft.     Tenderness: There is no abdominal tenderness. There is no guarding.  Musculoskeletal:     Right lower leg: Edema present.     Left lower leg: Edema present.     Comments: +2 pitting edema to BLLE  Skin:    General: Skin is warm and dry.  Neurological:     Mental Status: She is alert and oriented to person, place, and time.  Psychiatric:        Mood and Affect: Mood normal.        Behavior: Behavior normal.        Thought Content: Thought content normal.        Judgment: Judgment normal.     Imaging: NM PET Image Initial (PI) Whole Body  Result Date: 11/19/2021 CLINICAL DATA:  Subsequent treatment strategy for multiple myeloma. EXAM: NUCLEAR MEDICINE  PET WHOLE BODY TECHNIQUE: 16.0 mCi F-18 FDG was injected intravenously. Full-ring PET imaging was performed from the head to foot after the radiotracer. CT data was obtained and used for attenuation correction and anatomic localization. Fasting blood glucose: 152 mg/dl COMPARISON:  None Available. FINDINGS: Mediastinal blood pool activity: SUV max 2.1 HEAD/NECK: No hypermetabolic activity in the scalp. No hypermetabolic cervical lymph nodes. Incidental CT findings: none CHEST: No hypermetabolic mediastinal or hilar nodes. Focus nodular airspace disease is segmental pattern in the RIGHT upper lobe (35/series 7). This nodularity has mild metabolic activity SUV max equal 3.1. Additional focus of airspace disease in the perihilar LEFT lung also has a nodular component and with mild metabolic activity (SUV max equal 3.1 (image 122 fused data set) No hypermetabolic mediastinal nodes. Incidental CT findings: none ABDOMEN/PELVIS: No abnormal hypermetabolic activity within the liver, pancreas, adrenal glands, or spleen. No hypermetabolic lymph nodes in the abdomen or pelvis. Normal spleen.  No plasmacytoma. Incidental CT findings: none SKELETON: No focal radiotracer activity within the skeleton to suggest active multiple myeloma. Degenerative uptake in the RIGHT shoulder. Incidental CT findings: No suspicious lesion on the CT portion exam (no lytic or sclerotic lesion identified). Posterior lumbar fusion noted. EXTREMITIES: No abnormal hypermetabolic activity in the lower extremities. Incidental CT findings: No suspicious lytic lesion. IMPRESSION: 1. No evidence of active myeloma  on whole-body FDG PET scan. 2. No suspicious lytic lesions within the skeleton identified. 3. No plasmacytoma. 4. Nodular airspace disease in the RIGHT upper lobe and perihilar LEFT upper lobe. Findings most suggestive of pulmonary infection including atypical infection. Myeloma would be much less favored. Recommend clinical correlation with  pulmonary infection and recommend follow-up CT scan in 6 to 8 weeks to demonstrate resolution. These results will be called to the ordering clinician or representative by the Radiologist Assistant, and communication documented in the PACS or [employer]. Electronically Signed   By: Suzy Bouchard M.D.   On: 11/19/2021 16:51   CT BONE MARROW BIOPSY & ASPIRATION  Result Date: 11/19/2021 CLINICAL DATA:  Multiple myeloma, follow-up EXAM: CT GUIDED DEEP ILIAC BONE ASPIRATION AND CORE BIOPSY TECHNIQUE: Patient was placed prone on the CT gantry and limited axial scans through the pelvis were obtained. This exam was performed according to the departmental dose-optimization program which includes automated exposure control, adjustment of the mA and/or kV according to patient size and/or use of iterative reconstruction technique. Appropriate skin entry site was identified. Skin site was marked, prepped with chlorhexidine, draped in usual sterile fashion, and infiltrated locally with 1% lidocaine. Intravenous Fentanyl 151mg and Versed 255mwere administered as conscious sedation during continuous monitoring of the patient's level of consciousness and physiological / cardiorespiratory status by the radiology RN, with a total moderate sedation time of 15 minutes. Under CT fluoroscopic guidance an 11-gauge Cook trocar bone needle was advanced into the right iliac bone just lateral to the sacroiliac joint. Once needle tip position was confirmed, core and aspiration samples were obtained, submitted to pathology for approval. Patient tolerated procedure well. COMPLICATIONS: COMPLICATIONS none IMPRESSION: 1. Technically successful CT guided right iliac bone core and aspiration biopsy. Electronically Signed   By: D Lucrezia Europe.D.   On: 11/19/2021 11:12    Labs:  CBC: Recent Labs    10/22/21 0806 11/04/21 1118 11/19/21 0750 11/30/21 1259  WBC 3.4* 5.0 4.6 5.2  HGB 10.1* 9.9* 9.4* 9.1*  HCT 33.3* 32.3* 30.8* 29.1*   PLT 128* 123* 123* 125*    COAGS: No results for input(s): "INR", "APTT" in the last 8760 hours.  BMP: Recent Labs    03/24/21 1006 06/25/21 0828 08/13/21 0942 10/22/21 0806  NA 137 136 138 136  K 3.7 3.8 3.9 3.7  CL 104 103 104 104  CO2 _0 GLUCOSE 150* 173* 211* 136*  BUN _1 CALCIUM 8.5* 8.1* 8.2* 8.5*  CREATININE 0.62 0.59 0.66 0.66  GFRNONAA >60 >60 >60 >60    LIVER FUNCTION TESTS: Recent Labs    03/24/21 1006 06/25/21 0828 08/13/21 0942 10/22/21 0806  BILITOT 0.1* 0.4 0.3 0.5  AST _2 ALT _3 ALKPHOS 82 84 90 89  PROT 8.1 8.2* 8.2* 8.8*  ALBUMIN 2.9* 2.9* 2.8* 3.0*    TUMOR MARKERS: No results for input(s): "AFPTM", "CEA", "CA199", "CHROMGRNA" in the last 8760 hours.  Assessment and Plan: [age]o female presents to IR today for tunneled catheter with port placement with moderate sedation for treatment of multiple myeloma.   Pt resting on stretcher. She is A&O, calm and pleasant.  She is in no distress.  Pt is NPO per order. Pt on home O2 @ 3L.   Risks and benefits of image guided tunneled catheter with port placement with moderate sedation was discussed with the patient including, but not limited to bleeding,  infection, pneumothorax, or fibrin sheath development and need for additional procedures.  All of the patient's questions were answered, patient is agreeable to proceed. Consent signed and in chart.   Thank you for this interesting consult.  I greatly enjoyed meeting [name] and look forward to participating in their care.  A copy of this report was sent to the requesting provider on this date.  Electronically Signed: Tyson Alias, NP 12/02/2021, 10:26 AM   I spent a total of 20 minutes in face to face in clinical consultation, greater than 50% of which was counseling/coordinating care for multiple myeloma.

## 2021-12-02 NOTE — Telephone Encounter (Signed)
Oral Oncology Pharmacist Encounter  Received new prescription for Revlimid (lenalidomide) for the treatment of IgG kappa multiple myeloma in conjunction with daratumumab and dexamethasone, planned duration until disease control or unacceptable drug toxicity.  CMP from 11/10/21 (Care Everywhere) assessed, no relevant lab abnormalities. Prescription dose and frequency assessed.   Current medication list in Epic reviewed, no DDIs with lenalidomide identified.  Evaluated chart and no patient barriers to medication adherence identified.   Prescription has been e-scribed to [facility] for benefits analysis and approval.  Oral Oncology Clinic will continue to follow for insurance authorization, copayment issues, initial counseling and start date.   Alyson N. Leonard, PharmD, BCPS, BCOP, CPP Hematology/Oncology Clinical Pharmacist Practitioner [place]/[facility]/AP Oral Chemotherapy Navigation Clinic [phone]  12/02/2021 5:28 PM  

## 2021-12-02 NOTE — Telephone Encounter (Signed)
Oral Oncology Patient Advocate Encounter   Received notification that prior authorization for Lenalidomied is required.   PA submitted on 12/02/2021 [place] [id number] Status is pending     Lady Deutscher, CPhT-Adv Oncology Pharmacy Patient [place] Direct Number: [phone]  Fax: [phone]

## 2021-12-03 MED ORDER — LENALIDOMIDE 15 MG PO CAPS: 15.0000 mg | ORAL_CAPSULE | Freq: Every day | ORAL | 0 refills | Status: DC

## 2021-12-03 NOTE — Telephone Encounter (Signed)
Oral Chemotherapy Pharmacist Encounter  Due to insurance restriction the medication could not be filled at [facility]. Prescription has been e-scribed to [facility].  Supportive information was faxed to [facility]. We will continue to follow medication access.   Called and notified patient, provided with new pharmacy number.  Darl Pikes, PharmD, BCPS, [facility] Hematology/Oncology Clinical Pharmacist [facility] [place] [facility] [phone]  12/03/2021 11:47 AM

## 2021-12-03 NOTE — Telephone Encounter (Signed)
Oral Chemotherapy Pharmacist Encounter  Asked patient to reach out to [facility] on 12/08/21 if she has not heard from them about her lenalidomide. Patient has a low copay.   Patient Education I spoke with patient for overview of new oral chemotherapy medication:Revlimid (lenalidomide) for the treatment of IgG kappa multiple myeloma in conjunction with daratumumab and dexamethasone, planned duration until disease control or unacceptable drug toxicity. Planned start 12/14/21 along the her IV appt.   Pt is doing well. Counseled patient on administration, dosing, side effects, monitoring, drug-food interactions, safe handling, storage, and disposal. Patient will take 1 capsule (15 mg total) by mouth daily. Take for 21 days, then hold for 7 days. Repeat every 28 days.  Side effects include but not limited to: rash, diarrhea or constipation, decreased wbc/hgb/plt, nausea.    Reviewed with patient importance of keeping a medication schedule and plan for any missed doses.  After discussion with patient no patient barriers to medication adherence identified.   Ms. Era Skeen voiced understanding and appreciation. All questions answered. Medication handout provided.  Provided patient with [facility] [place] [facility] phone number. Patient knows to call the office with questions or concerns. Oral Chemotherapy Navigation Clinic will continue to follow.  Darl Pikes, PharmD, BCPS, BCOP, CPP Hematology/Oncology Clinical Pharmacist Practitioner [place]/[facility] [place] [facility] [phone]  12/03/2021 1:05 PM

## 2021-12-08 ENCOUNTER — Other Ambulatory Visit: Payer: Self-pay | Admitting: *Deleted

## 2021-12-08 MED ORDER — LIDOCAINE-PRILOCAINE 2.5-2.5 % EX CREA: 1.0000 | TOPICAL_CREAM | CUTANEOUS | 3 refills | Status: DC | PRN

## 2021-12-08 NOTE — Patient Instructions (Addendum)
[facility] Chemotherapy Teaching   You are diagnosed with Stage II IgG kappa multiple myeloma, standard risk. We will treat you weekly in the clinic with a drug called daratumumab (Darzalex). This is an injection you will receive the under the skin in your abdomen. Also as part of your treatment, you will be taking a steroid called dexamethasone - you will take this weekly (35m). You may take on the days of your weekly injection. The final part of your treatment is a drug called Revlimid. This a chemo pill you will take daily for 21 days. After the 21 days, you will take a week off from taking this pill, then restart after those 7 days to repeat 21 days. This is an ongoing cycle. The intent of treatment is to get this disease under control and to alleviate any symptoms you may be having related to the myeloma. You will see the doctor regularly throughout treatment.  We will obtain blood work from you prior to every treatment and monitor your results to make sure it is safe to give your treatment. The doctor monitors your response to treatment by the way you are feeling, your blood work, and by obtaining scans periodically.  There will be wait times while you are here for treatment.  It will take about 30 minutes to 1 hour for your lab work to result. Then there will be wait times while pharmacy mixes your medications.    Daratumumab (Darzalex Faspro)  About This Drug Daratumumab is used to treat cancer. It is given under the skin in your abdomen (subcutaneously).  Possible Side Effects   You may have a reaction to the drug. Sometimes you may be given medication to stop or lessen these side effects. Your nurse will check you closely for these signs: fever or shaking chills, flushing, facial swelling, feeling dizzy, headache, trouble breathing, rash, itching, chest tightness, or chest pain. These reactions may happen after your infusion. If this happens, call 911 for emergency care.    Decrease in the number of white blood cells and platelets. This may raise your risk of infection, and raise your risk of bleeding.   Fever and chills   Tiredness   Feeling dizzy   Trouble sleeping   Cough and trouble breathing   Upper respiratory infection   Nausea and throwing up (vomiting)   Loose bowel movements (diarrhea)   Constipation (not able to move bowels)   Muscle spasms   Pain in the joints   Back pain   Swelling of your legs, ankles and/or feet   Effects on the nerves are called peripheral neuropathy. You may feel numbness, tingling, or pain in your hands and feet. It may be hard for you to button your clothes, open jars, or walk as usual. The effect on the nerves may get worse with more doses of the drug. These effects get better in some people after the drug is stopped but it does not get better in all people.  Note: Each of the side effects above was reported in 20% or greater of patients treated with daratumumab. Not all possible side effects are included above.  Warnings and Precautions   Severe decrease in the number of white blood cells and platelets    Severe allergic reaction   This medication can affect the results of blood tests that match your blood type. Your blood type will be tested before treatment. Be sure to tell all healthcare providers you are taking this medicine  before receiving blood transfusions, even for 6 months after your last dose.  Important Information   This drug may be present in the saliva, tears, sweat, urine, stool, vomit, semen, and vaginal secretions. Talk to your doctor and/or your nurse about the necessary precautions to take during this time.  Treating Side Effects   Drink plenty of fluids (a minimum of eight glasses per day is recommended).   If you throw up or have loose bowel movements, you should drink more fluids so that you do not become dehydrated (lack of water in the body from losing too much fluid).   To  help with nausea and vomiting, eat small, frequent meals instead of three large meals a day. Choose foods and drinks that are at room temperature. Ask your nurse or doctor about other helpful tips and medicine that is available to help stop or lessen these symptoms.   If you have diarrhea, eat low-fiber foods that are high in protein and calories and avoid foods that can irritate your digestive tracts or lead to cramping.   Ask your nurse or doctor about medicine that can lessen or stop your diarrhea or constipation.   If you are not able to move your bowels, check with your doctor or nurse before you use enemas, laxatives, or suppositories.   Manage tiredness by pacing your activities for the day.   Be sure to include periods of rest between energy-draining activities.   To decrease the risk of infection, wash your hands regularly.   Avoid close contact with people who have a cold, the flu, or other infections.   Take your temperature as your doctor or nurse tells you, and whenever you feel like you may have a fever.   To help decrease the risk of bleeding, use a soft toothbrush. Check with your nurse before using dental floss.   Be very careful when using knives or tools.   Use an electric shaver instead of a razor.   Keeping your pain under control is important to your well-being. Please tell your doctor or nurse if you are experiencing pain.   If you are dizzy, get up slowly after sitting or lying.   If you are having trouble sleeping, talk to your nurse or doctor on tips to help you sleep better.   If you have numbness and tingling in your hands and feet, be careful when cooking, walking, and handling sharp objects and hot liquids.   Infusion reactions may rarely occur after your infusion. If this happens, call 911 for emergency care.  Food and Drug Interactions   There are no known interactions of daratumumab with food.   This drug may interact with other medicines. Tell  your doctor and pharmacist about all the prescription and over-the-counter medicines and dietary supplements (vitamins, minerals, herbs and others) that you are taking at this time. Also, check with your doctor or pharmacist before starting any new prescription or over-the-counter medicines, or dietary supplements to make sure that there are no interactions.  When to Call the Doctor  Call your doctor or nurse if you have any of these symptoms and/or any new or unusual symptoms:   Fever of 100.4 F (38 C) or higher   Chills   Pain in your chest   Coughing up yellow, green, or bloody mucus.   Wheezing or trouble breathing   Tiredness that interferes with your daily activities   Trouble falling or staying asleep   Feeling dizzy or lightheaded  Easy bleeding or bruising   Nausea that stops you from eating or drinking and/or is not relieved by prescribed medicines   Vomiting   No bowel movement in 3 days or when you feel uncomfortable.   Loose bowel movements (diarrhea) 4 times a day or loose bowel movements with lack of strength or a feeling of being dizzy   Weight gain of 5 pounds in one week (fluid retention)   Swelling of your legs, ankles and/or feet   Pain that does not go away, or is not relieved by prescribed medicines   Signs of infusion reaction: fever or shaking chills, flushing, facial swelling, feeling dizzy, headache, trouble breathing, rash, itching, chest tightness, or chest pain. If this happens, call 911 for emergency care.   Numbness, tingling, or pain in your hands and feet   If you think you may be pregnant or may have impregnated your partner  Reproduction Warnings   Pregnancy warning: This drug may have harmful effects on the unborn baby. Women of childbearing potential should use effective methods of birth control during your cancer treatment and for at least 3 months after treatment. Let your doctor know right away if you think you may be  pregnant.   Breastfeeding warning: It is not known if this drug passes into breast milk. For this reason, women should talk to their doctor about the risks and benefits of breastfeeding during treatment with this drug because this drug may enter the breast milk and cause harm to a breastfeeding baby.   Fertility warning: Human fertility studies have not been done with this drug. Talk with your doctor or nurse if you plan to have children. Ask for information on sperm or egg banking.   Lenalidomide (Revlimid)  About This Drug Lenalidomide is used to treat cancer. It is given orally (by mouth). Take as prescribed 3 weeks on and 1 week off every 28 days.   Possible Side Effects  Bone marrow suppression. This is a decrease in the number of white blood cells, red blood cells, and platelets. This may raise your risk of infection, make you tired and weak (fatigue), and raise your risk of bleeding.   Nausea   Diarrhea (loose bowel movements)   Constipation (unable to move bowels)   Inflammation of your stomach and/or intestines   Pain in your abdomen or back pain   Fever   Tiredness and weakness   Swelling of your legs, ankles and/or feet   Decreased appetite (decreased hunger)   Muscle cramps/spasms   Pain in your joints   Headache   Feeling dizzy   Tremor   Trouble sleeping   Nosebleed   Upper respiratory infection, bronchitis   Inflammation of the nasal passages and throat   Trouble breathing   Cough   Rash and itching  Note: Each of the side effects above was reported in 15% or greater of patients treated with lenalidomide. Not all possible side effects are included above.  Warnings and Precautions  Blood clots and events such as stroke and heart attack. A blood clot in your leg may cause your leg to swell, appear red and warm, and/or cause pain. A blood clot in your lungs may cause trouble breathing, pain when breathing, and/or chest pain.   Severe bone  marrow suppression   Changes in your liver function, which may cause liver failure and be life-threatening.   Tumor lysis syndrome: This drug may act on the cancer cells very quickly. This may affect how  your kidneys work and can be life-threatening.   Changes in your thyroid function   Severe allergic skin reaction which may be life-threatening. You may develop blisters on your skin that are filled with fluid or a severe red rash all over your body that may be painful.   This drug may raise your risk of getting a second cancer.   You may develop a syndrome called tumor flare reaction. You may have painful lymph nodes, enlarged spleen, fever, and a rash.   This drug may make it more difficult to collect your stem cells if a stem cell transplant is part of your treatment plan.   There is a rare increased risk of death in patients with chronic lymphocytic leukemia and a risk of early death (dying sooner) in patient with mantle cell lymphoma.   Allergic reactions, including anaphylaxis are rare but may happen in some patients. Signs of allergic reaction to this drug may be swelling of the face, feeling like your tongue or throat are swelling, trouble breathing, rash, itching, fever, chills, feeling dizzy, and/or feeling that your heart is beating in a fast or not normal way. If this happens, do not take another dose of this drug. You should get urgent medical treatment.  Note: Some of the side effects above are very rare. If you have concerns and/or questions, please discuss them with your medical team.  Important Information  You will need to sign up for a special program called Revlimid REMS when you start taking this drug. Your nurse will help you get started.   Two negative pregnancy tests are required in women of childbearing potential prior to starting treatment. Routine pregnancy tests are required during treatment.   Do not donate blood during your treatment and for 4 weeks after your  treatment.   Men should not donate sperm during your treatment and for 4 weeks after your treatment because this drug is present in semen and may badly harm a baby.   How to Take Your Medication  Swallow the medicine whole with water, with or without food. Do not chew, break, or open it.   Take this medicine at about the same time each day   Missed dose: If you miss a dose, take it as soon as you think about it ONLY if it has been less than 12 hours since you normally take the missed dose. If it has been more than 12 hours, skip the missed dose and contact your physician. Take your next dose at the regular time. Do not take 2 doses at the same time and do not double up on the next dose.   If you vomit a dose, take your next dose at the regular time.   Handling: Wash your hands after handling your medicine, your caretakers should not handle your medicine with bare hands and should wear latex gloves.   If you get any of the content of a broken capsules on your skin, you should wash the area of the skin well with soap and water right away. Call your doctor if you get a skin reaction.   This drug may be present in the saliva, tears, sweat, urine, stool, vomit, semen, and vaginal secretions. Talk to your doctor and/or your nurse about the necessary precautions to take during this time.   Storage: Store this medicine in the original container at room temperature.   Disposal of unused medicine: Do not flush any expired and/or unused medicine down the toilet or drain unless  you are specifically instructed to do so on the medication label. Some facilities have take-back programs and/or other options. If you do not have a take-back program in your area, then please discuss with your nurse or your doctor how to dispose of unused medicine.  Treating Side Effects  Manage tiredness by pacing your activities for the day.   Be sure to include periods of rest between energy-draining activities.   If you  are dizzy, get up slowly after sitting or lying.   To decrease the risk of infection, wash your hands regularly.   Avoid close contact with people who have a cold, the flu, or other infections.   Take your temperature as your doctor or nurse tells you, and whenever you feel like you may have a fever.   To help decrease the risk of bleeding, use a soft toothbrush. Check with your nurse before using dental floss.   Be very careful when using knives or tools.   Use an electric shaver instead of a razor.   Ask your doctor or nurse about medicines that are available to help stop or lessen constipation and/or diarrhea.   If you are not able to move your bowels, check with your doctor or nurse before you use enemas, laxatives, or suppositories.   Drink plenty of fluids (a minimum of eight glasses per day is recommended).   Drink fluids that contribute calories (whole milk, juice, soft drinks, sweetened beverages, milkshakes, and nutritional supplements) instead of water.   If you throw up or have loose bowel movements, you should drink more fluids so that you do not become dehydrated (lack of water in the body from losing too much fluid).   If you have diarrhea, eat low-fiber foods that are high in protein and calories and avoid foods that can irritate your digestive tracts or lead to cramping.   To help with nausea and vomiting, eat small, frequent meals instead of three large meals a day.  Choose foods and drinks that are at room temperature. Ask your nurse or doctor about other helpful tips and medicine that is available to help stop or lessen these symptoms.   To help with decreased appetite, eat small, frequent meals. Eat foods high in calories and protein, such as meat, poultry, fish, dry beans, tofu, eggs, nuts, milk, yogurt, cheese, ice cream, pudding, and nutritional supplements.   Consider using sauces and spices to increase taste. Daily exercise, with your doctor's approval, may  increase your appetite.   If you get a rash, do not put anything on it unless your doctor or nurse says you may. Keep the area around the rash clean and dry. Ask your doctor for medicine if your rash bothers you.   Keeping your pain under control is important to your well-being. Please tell your doctor or nurse if you are experiencing pain.   If you are having trouble sleeping, talk to your nurse or doctor on tips to help you sleep better.   If you have a nosebleed, sit with your head tipped slightly forward. Apply pressure by lightly pinching the bridge of your nose between your thumb and forefinger. Call your doctor if you feel dizzy or faint or if the bleeding does not stop after 10 to 15 minutes.   Moisturize your skin several times a day.   Avoid sun exposure and apply sunscreen routinely when outdoors.  Food and Drug Interactions   There are no known interactions of lenalidomide with food.   Check  with your doctor or pharmacist about all other prescription medicines and over-the-counter medicines and dietary supplements (vitamins, minerals, herbs, and others) you are taking before starting this medicine as there are known drug interactions with lenalidomide. Also, check with your doctor or pharmacist before starting any new prescription or over-the-counter medicines, or dietary supplements to make sure that there are no interactions.   There are known interactions of lenalidomide with blood-thinning medicine such as warfarin. Ask your doctor what precautions you should take.  When to Call the Doctor Call your doctor or nurse if you have any of these symptoms and/or any new or unusual symptoms:  Fever of 100.4 F (38 C) or higher   Chills   Tiredness that interferes with your daily activities   Feeling dizzy or lightheaded   Easy bleeding or bruising   Your leg or arm is swollen, red, warm, and/or painful   Headache that does not go away   Nosebleed that does not stop  bleeding after 10-15 minutes   Painful lymph nodes   Wheezing and/or trouble breathing   Chest pain or symptoms of a heart attack. Most heart attacks involve pain in the center of the chest that lasts more than a few minutes. The pain may go away and come back. It can feel like pressure, squeezing, fullness, or pain. Sometimes pain is felt in one or both arms, the back, neck, jaw, or stomach. If any of these symptoms last 2 minutes, call 911.   Symptoms of a stroke such as sudden numbness or weakness of your face, arm, or leg, mostly on one side of your body; sudden confusion, trouble speaking or understanding; sudden trouble seeing in one or both eyes; sudden trouble walking, feeling dizzy, loss of balance or coordination; or sudden, bad headache with no known cause. If you have any of these symptoms for 2 minutes, call 911.   Signs of allergic reaction: swelling of the face, feeling like your tongue or throat are swelling, trouble breathing, rash, itching, fever, chills, feeling dizzy, and/or feeling that your heart is beating in a fast or not normal way. If this happens, call 911 for emergency care.   Coughing up yellow, green, or bloody mucus   Feeling that your heart is beating in a fast or not normal way (palpitations)   Nausea that stops you from eating or drinking and/or is not relieved by prescribed medicines   Throwing up more than 3 times a day   Loose bowel movements (diarrhea) 4 times a day or loose bowel movements with lack of strength or a feeling of being dizzy   No bowel movement in 3 days or when you feel uncomfortable   Trouble falling or staying asleep   Pain in your abdomen that does not go away   Weight gain of 5 pounds in one week (fluid retention)   Swelling of your legs, ankles and/or feet   Unexplained weight gain   Lasting loss of appetite or rapid weight loss of five pounds in a week   Pain that does not go away, or is not relieved by prescribed  medicines   Flu-like symptoms: fever, headache, muscle and joint aches, and fatigue (low energy, feeling weak)   A new rash or itching that is not relieved by prescribed medicines   Signs of possible liver problems: dark urine, pale bowel movements, bad stomach pain, feeling very tired and weak, unusual itching, or yellowing of the eyes or skin   Signs of tumor  lysis: confusion or agitation, decreased urine, nausea/vomiting, diarrhea, muscle cramping, numbness and/or tingling, seizures   If you think you may be pregnant or may have impregnated your partner  Reproduction Warnings   Pregnancy warning: This drug can have harmful effects on the unborn baby. Women of childbearing potential must commit to abstain from heterosexual intercourse or use 2 effective methods of birth control, one of which, must be a highly effective method of birth control, beginning at least 4 weeks before treatment starts, during your cancer treatment, including dose interruptions, and for at least 4 weeks after treatment. A highly effective method of birth control includes tubal ligation, intrauterine device (IUD), hormonal (birth control pills, injections, patch and/or implants) or a partner's vasectomy. Stop taking lenalidomide immediately and let your doctor know right away if you think you may be pregnant, miss your menstrual period, or experience unusual menstrual bleeding.   Men with female partners of childbearing potential should use effective methods of birth control during your cancer treatment and for at least 4 weeks after your cancer treatment.    Breastfeeding warning: Women should not breastfeed during treatment because this drug could enter the breast milk and cause harm to a breastfeeding baby.   Fertility warning: Human fertility studies have not been done with this drug. Talk with your doctor or nurse if you plan to have children. Ask for information on sperm or egg banking.   Dexamethasone  (Decadron)  About This Drug  Dexamethasone is used to treat cancer, to decrease inflammation and sometimes used before and after chemotherapy to prevent or treat nausea and/or vomiting. It is given in the vein (IV) or orally (by mouth).  Possible Side Effects   Headache   High blood pressure   Abnormal heart beat   Tiredness and weakness   Changes in mood, which may include depression or a feeling of extreme well-being   Trouble sleeping   Increased sweating   Increased appetite (increased hunger)   Weight gain   Increase risk of infections   Pain in your abdomen   Nausea   Skin changes such as rash, dryness, redness   Blood sugar levels may change   Electrolyte changes   Swelling of your legs, ankles and/or feet   Changes in your liver function   You may be at risk for cataracts, glaucoma or infections of the eye   Muscle loss and / or weakness (lack of muscle strength)   Increased risk of developing osteoporosis- your bones may become weak and brittle  Note: Not all possible side effects are included above.  Warnings and Precautions   This drug may cause you to feel irritable, nervous or restless.   Allergic reactions, including anaphylaxis are rare but may happen in some patients. Signs of allergic reaction to this drug may be swelling of the face, feeling like your tongue or throat are swelling, trouble breathing, rash, itching, fever, chills, feeling dizzy, and/or feeling that your heart is beating in a fast or not normal way. If this happens, do not take another dose of this drug. You should get urgent medical treatment.   High blood pressure and changes in electrolytes, which can cause fluid build-up around your heart, lungs or elsewhere.   Increased risk of developing a hole in your stomach, small, and/or large intestine if you have ulcers in the lining of your stomach and/or intestine, or have diverticulitis, ulcerative colitis and/or other diseases  that affect the gastrointestinal tract.   Effects on the  endocrine glands including the pituitary, adrenals or thyroid during or after use of this medication.   Changes in the tissue of the heart, that can cause your heart to have less ability to pump blood. You may be short of breath or our arms, hands, legs and feet may swell.   Increased risk of heart attack.   Severe depression and other psychiatric disorders such as mood changes.   Burning, pain and itching around your anus may happen when this drug is given in the vein too rapidly (IV). It usually happens suddenly and resolves in less than 1 minute.  Important Information   Talk to your doctor or your nurse before stopping this medication, it should be stopped gradually. Depending on the dose and length of treatment, you could experience serious side effects if stopped abruptly (suddenly).   Talk to your doctor before receiving any vaccinations during your treatment. Some vaccinations are not recommended while receiving dexamethasone.  How to Take Your Medication   For Oral (by mouth): You can take the medicine with or without food. If you have nausea or upset stomach, take it with food.   Missed dose: If you miss a dose, do not take 2 doses at the same time or extra doses.  If you vomit a dose, take your next dose at the regular time. Do not take 2 doses at the same time   Handling: Wash your hands after handling your medicine, your caretakers should not handle your medicine with bare hands and should wear latex gloves.   Storage: Store this medicine in the original container at room temperature. Protect from moisture and light. Discuss with your nurse or your doctor how to dispose of unused medicine.   Treating Side Effects   Drink plenty of fluids (a minimum of eight glasses per day is recommended).   To help with nausea and vomiting, eat small, frequent meals instead of three large meals a day. Choose foods and drinks that  are at room temperature. Ask your nurse or doctor about other helpful tips and medicine that is available to help stop or lessen these symptoms.   If you throw up, you should drink more fluids so that you do not become dehydrated (lack of water in the body from losing too much fluid).   Manage tiredness by pacing your activities for the day.   Be sure to include periods of rest between energy-draining activities.   To help with muscle weakness, get regular exercise. If you feel too tired to exercise vigorously, try taking a short walk.   If you are having trouble sleeping, talk to your nurse or doctor on tips to help you sleep better.   If you are feeling depressed, talk to your nurse or doctor about it.   Keeping your pain under control is important to your well-being. Please tell your doctor or nurse if you are experiencing pain.   If you have diabetes, keep good control of your blood sugar level. Tell your nurse or your doctor if your glucose levels are higher or lower than normal.   To decrease the risk of infection, wash your hands regularly.   Avoid close contact with people who have a cold, the flu, or other infections.   Take your temperature as your doctor or nurse tells you, and whenever you feel like you may have a fever.   If you get a rash do not put anything on it unless your doctor or nurse says you may.  Keep the area around the rash clean and dry. Ask your doctor for medicine if your rash bothers you.   Moisturize your skin several times day.   Avoid sun exposure and apply sunscreen routinely when outdoors.  Food and Drug Interactions   There are no known interactions of dexamethasone with food.   Check with your doctor or pharmacist about all other prescription medicines and over-the-counter medicines and dietary supplements (vitamins, minerals, herbs and others) you are taking before starting this medicine as there are known drug interactions with dexamethasone.  Also, check with your doctor or pharmacist before starting any new prescription or over-the-counter medicines, or dietary supplement to make sure that there are no interactions.   There are known interactions of dexamethasone with other medicines and products like acetaminophen, aspirin, and ibuprofen. Ask your doctor what over-the-counter (OTC) medicines you can take.  When to Call the Doctor  Call your doctor or nurse if you have any of these symptoms and/or any new or unusual symptoms:   Fever of 100.4 F (38 C) or higher   Chills   A headache that does not go away   Trouble breathing   Blurry vision or other changes in eyesight   Feel irritable, nervous or restless   Trouble falling or staying asleep   Severe mood changes such as depression or unusual thoughts and/or behaviors   Thoughts of hurting yourself or others, and suicide   Tiredness that interferes with your daily activities   Feeling that your heart is beating in a fast, slow or not normal way   Feeling dizzy or lightheaded   Chest pain or symptoms of a heart attack. Most heart attacks involve pain in the center of the chest that lasts more than a few minutes. The pain may go away and come back, or it can be constant. It can feel like pressure, squeezing, fullness, or pain. Sometimes pain is felt in one or both arms, the back, neck, jaw, or stomach. If any of these symptoms last 2 minutes, call 911.   Heartburn or indigestion   Nausea that stops you from eating or drinking and/or is not relieved by prescribed medicines   Throwing up more than 3 times a day   Pain in your abdomen that does not go away   Abnormal blood sugar   Unusual thirst, passing urine often, headache, sweating, shakiness, irritability   Swelling of legs, ankles, or feet   Weight gain of 5 pounds in one week (fluid retention)   Signs of possible liver problems: dark urine, pale bowel movements, bad stomach pain, feeling very tired  and weak, unusual itching, or yellowing of the eyes or skin   Severe muscle weakness   A new rash or a rash that is not relieved by prescribed medicines   Signs of allergic reaction: swelling of the face, feeling like your tongue or throat are swelling, trouble breathing, rash, itching, fever, chills, feeling dizzy, and/or feeling that your heart is beating in a fast or not normal way. If this happens, call 911 for emergency care.   If you think you may be pregnant  Reproduction Warnings   Pregnancy warning: It is not known if this drug may harm an unborn child. For this reason, be sure to talk with your doctor if you are pregnant or planning to become pregnant while receiving this drug. Let your doctor know right away if you think you may be pregnant or may have impregnated your partner.  Breastfeeding warning: It is not known if this drug passes into breast milk. For this reason, women should talk to their doctor about the risks and benefits of breastfeeding during treatment with this drug because this drug may enter the breast milk and cause harm to a breastfeeding baby.   Fertility warning: Human fertility studies have not been done with this drug. Talk with your doctor or nurse if you plan to have children. Ask for information on sperm banking.    SELF CARE ACTIVITIES WHILE ON CHEMOTHERAPY/IMMUNOTHERAPY:  Hydration Increase your fluid intake 48 hours prior to treatment and drink at least 8 to 12 cups (64 ounces) of water/decaffeinated beverages per day after treatment. You can still have your cup of coffee or soda but these beverages do not count as part of your 8 to 12 cups that you need to drink daily. No alcohol intake.  Medications Continue taking your normal prescription medication as prescribed.  If you start any new herbal or new supplements please let us know first to make sure it is safe.  Mouth Care Have teeth cleaned professionally before starting treatment. Keep  dentures and partial plates clean. Use soft toothbrush and do not use mouthwashes that contain alcohol. Biotene is a good mouthwash that is available at most pharmacies or may be ordered by calling [phone]. Use warm salt water gargles (1 teaspoon salt per 1 quart warm water) before and after meals and at bedtime. Or you may rinse with 2 tablespoons of three-percent hydrogen peroxide mixed in eight ounces of water. If you are still having problems with your mouth or sores in your mouth please call the clinic. If you need dental work, please let the doctor know before you go for your appointment so that we can coordinate the best possible time for you in regards to your chemo regimen. You need to also let your dentist know that you are actively taking chemo. We may need to do labs prior to your dental appointment.  Skin Care Always use sunscreen that has not expired and with SPF (Sun Protection Factor) of 50 or higher. Wear hats to protect your head from the sun. Remember to use sunscreen on your hands, ears, face, & feet.  Use good moisturizing lotions such as udder cream, eucerin, or even Vaseline. Some chemotherapies can cause dry skin, color changes in your skin and nails.    Avoid long, hot showers or baths. Use gentle, fragrance-free soaps and laundry detergent. Use moisturizers, preferably creams or ointments rather than lotions because the thicker consistency is better at preventing skin dehydration. Apply the cream or ointment within 15 minutes of showering. Reapply moisturizer at night, and moisturize your hands every time after you wash them.   Infection Prevention Please wash your hands for at least 30 seconds using warm soapy water. Handwashing is the #1 way to prevent the spread of germs. Stay away from sick people or people who are getting over a cold. If you develop respiratory systems such as green/yellow mucus production or productive cough or persistent cough let us know and we  will see if you need an antibiotic. It is a good idea to keep a pair of gloves on when going into grocery stores/[employer] to decrease your risk of coming into contact with germs on the carts, etc. Carry alcohol hand gel with you at all times and use it frequently if out in public. If your temperature reaches 100.5 or higher please call the clinic and let us know.  If it is after hours or on the weekend please go to the ER if your temperature is over 100.4.  Please have your own personal thermometer at home to use.    Sex and bodily fluids If you are going to have sex, a condom must be used to protect the person that isn't taking immunotherapy. For a few days after treatment, immunotherapy can be excreted through your bodily fluids.  When using the toilet please close the lid and flush the toilet twice.  Do this for a few day after you have had immunotherapy.   Contraception It is not known for sure whether or not immunotherapy drugs can be passed on through semen or secretions from the vagina. Because of this some doctors advise people to use a barrier method if you have sex during treatment. This applies to vaginal, anal or oral sex.  Generally, doctors advise a barrier method only for the time you are actually having the treatment and for about a week after your treatment.  Advice like this can be worrying, but this does not mean that you have to avoid being intimate with your partner. You can still have close contact with your partner and continue to enjoy sex.  Animals If you have cats or birds we just ask that you not change the litter or change the cage.  Please have someone else do this for you while you are on immunotherapy.   Food Safety During and After Cancer Treatment Food safety is important for people both during and after cancer treatment. Cancer and cancer treatments, such as chemotherapy, radiation therapy, and stem cell/bone marrow transplantation, often weaken the immune system.  This makes it harder for your body to protect itself from foodborne illness, also called food poisoning. Foodborne illness is caused by eating food that contains harmful bacteria, parasites, or viruses.  Foods to avoid Some foods have a higher risk of becoming tainted with bacteria. These include: Unwashed fresh fruit and vegetables, especially leafy vegetables that can hide dirt and other contaminants Raw sprouts, such as alfalfa sprouts Raw or undercooked beef, especially ground beef, or other raw or undercooked meat and poultry Fatty, fried, or spicy foods immediately before or after treatment.  These can sit heavy on your stomach and make you feel nauseous. Raw or undercooked shellfish, such as oysters. Sushi and sashimi, which often contain raw fish.  Unpasteurized beverages, such as unpasteurized fruit juices, raw milk, raw yogurt, or cider Undercooked eggs, such as soft boiled, over easy, and poached; raw, unpasteurized eggs; or foods made with raw egg, such as homemade raw cookie dough and homemade mayonnaise  Simple steps for food safety  Shop smart. Do not buy food stored or displayed in an unclean area. Do not buy bruised or damaged fruits or vegetables. Do not buy cans that have cracks, dents, or bulges. Pick up foods that can spoil at the end of your shopping trip and store them in a cooler on the way home.  Prepare and clean up foods carefully. Rinse all fresh fruits and vegetables under running water, and dry them with a clean towel or paper towel. Clean the top of cans before opening them. After preparing food, wash your hands for 20 seconds with hot water and soap. Pay special attention to areas between fingers and under nails. Clean your utensils and dishes with hot water and soap. Disinfect your kitchen and cutting boards using 1 teaspoon of liquid, unscented bleach mixed into 1 quart of water.  Dispose of old food. Eat canned and packaged food before its expiration  date (the "use by" or "best before" date). Consume refrigerated leftovers within 3 to 4 days. After that time, throw out the food. Even if the food does not smell or look spoiled, it still may be unsafe. Some bacteria, such as Listeria, can grow even on foods stored in the refrigerator if they are kept for too long.  Take precautions when eating out. At restaurants, avoid buffets and salad bars where food sits out for a long time and comes in contact with many people. Food can become contaminated when someone with a virus, often a norovirus, or another "bug" handles it. Put any leftover food in a "to-go" container yourself, rather than having the server do it. And, refrigerate leftovers as soon as you get home. Choose restaurants that are clean and that are willing to prepare your food as you order it cooked.    SYMPTOMS TO REPORT AS SOON AS POSSIBLE AFTER TREATMENT:  FEVER GREATER THAN 100.4 F CHILLS WITH OR WITHOUT FEVER NAUSEA AND VOMITING THAT IS NOT CONTROLLED WITH YOUR NAUSEA MEDICATION UNUSUAL SHORTNESS OF BREATH UNUSUAL BRUISING OR BLEEDING TENDERNESS IN MOUTH AND THROAT WITH OR WITHOUT PRESENCE OF ULCERS URINARY PROBLEMS BOWEL PROBLEMS UNUSUAL RASH     Wear comfortable clothing and clothing appropriate for easy access to any Portacath or PICC line. Let us know if there is anything that we can do to make your therapy better!   What to do if you need assistance after hours or on the weekends: CALL [phone].  HOLD on the line, do not hang up.  You will hear multiple messages but at the end you will be connected with a nurse triage line.  They will contact the doctor if necessary.  Most of the time they will be able to assist you.  Do not call the hospital operator.    I have been informed and understand all of the instructions given to me and have received a copy. I have been instructed to call the clinic [phone] or my family physician as soon as possible for continued  medical care, if indicated. I do not have any more questions at this time but understand that I may call the [facility] or the Patient Navigator at [phone] during office hours should I have questions or need assistance in obtaining follow-up care.

## 2021-12-08 NOTE — Telephone Encounter (Signed)
Med delivered today 12/08/21

## 2021-12-08 NOTE — Telephone Encounter (Signed)
Spoke the [name], her today lenalidomide was delivered today. She understands that she will not start until 12/14/21

## 2021-12-09 ENCOUNTER — Inpatient Hospital Stay: Payer: Medicare PPO

## 2021-12-09 VITALS — BP 130/60 | HR 79 | Temp 98.5°F | Resp 20

## 2021-12-09 DIAGNOSIS — D500 Iron deficiency anemia secondary to blood loss (chronic): Secondary | ICD-10-CM | POA: Diagnosis not present

## 2021-12-09 DIAGNOSIS — K5900 Constipation, unspecified: Secondary | ICD-10-CM | POA: Diagnosis not present

## 2021-12-09 DIAGNOSIS — D696 Thrombocytopenia, unspecified: Secondary | ICD-10-CM | POA: Diagnosis not present

## 2021-12-09 DIAGNOSIS — K7460 Unspecified cirrhosis of liver: Secondary | ICD-10-CM | POA: Diagnosis not present

## 2021-12-09 DIAGNOSIS — E119 Type 2 diabetes mellitus without complications: Secondary | ICD-10-CM | POA: Diagnosis not present

## 2021-12-09 DIAGNOSIS — D3502 Benign neoplasm of left adrenal gland: Secondary | ICD-10-CM | POA: Diagnosis not present

## 2021-12-09 DIAGNOSIS — C9000 Multiple myeloma not having achieved remission: Secondary | ICD-10-CM | POA: Diagnosis not present

## 2021-12-09 DIAGNOSIS — M129 Arthropathy, unspecified: Secondary | ICD-10-CM | POA: Diagnosis not present

## 2021-12-09 DIAGNOSIS — K922 Gastrointestinal hemorrhage, unspecified: Secondary | ICD-10-CM | POA: Diagnosis not present

## 2021-12-09 LAB — CBC WITH DIFFERENTIAL/PLATELET
Abs Immature Granulocytes: 0.02 10*3/uL (ref 0.00–0.07)
Basophils Absolute: 0 10*3/uL (ref 0.0–0.1)
Basophils Relative: 0 %
Eosinophils Absolute: 0 10*3/uL (ref 0.0–0.5)
Eosinophils Relative: 0 %
HCT: 28.8 % — ABNORMAL LOW (ref 36.0–46.0)
Hemoglobin: 8.7 g/dL — ABNORMAL LOW (ref 12.0–15.0)
Immature Granulocytes: 0 %
Lymphocytes Relative: 18 %
Lymphs Abs: 1.3 10*3/uL (ref 0.7–4.0)
MCH: 28.8 pg (ref 26.0–34.0)
MCHC: 30.2 g/dL (ref 30.0–36.0)
MCV: 95.4 fL (ref 80.0–100.0)
Monocytes Absolute: 0.5 10*3/uL (ref 0.1–1.0)
Monocytes Relative: 7 %
Neutro Abs: 5.1 10*3/uL (ref 1.7–7.7)
Neutrophils Relative %: 75 %
Platelets: 128 10*3/uL — ABNORMAL LOW (ref 150–400)
RBC: 3.02 MIL/uL — ABNORMAL LOW (ref 3.87–5.11)
RDW: 16.3 % — ABNORMAL HIGH (ref 11.5–15.5)
WBC: 7 10*3/uL (ref 4.0–10.5)
nRBC: 0 % (ref 0.0–0.2)

## 2021-12-09 MED ORDER — CYANOCOBALAMIN 1000 MCG/ML IJ SOLN
1000.0000 ug | Freq: Once | INTRAMUSCULAR | Status: AC
  Administered 2021-12-09: 1000 ug via INTRAMUSCULAR
  Filled 2021-12-09: qty 1

## 2021-12-09 MED ORDER — SODIUM CHLORIDE 0.9 % IV SOLN: Freq: Once | INTRAVENOUS | Status: AC

## 2021-12-09 MED ORDER — SODIUM CHLORIDE 0.9% FLUSH
10.0000 mL | Freq: Once | INTRAVENOUS | Status: AC | PRN
  Administered 2021-12-09: 10 mL

## 2021-12-09 MED ORDER — HEPARIN SOD (PORK) LOCK FLUSH 100 UNIT/ML IV SOLN
500.0000 [IU] | Freq: Once | INTRAVENOUS | Status: AC | PRN
  Administered 2021-12-09: 500 [IU]

## 2021-12-09 MED ORDER — SODIUM CHLORIDE 0.9 % IV SOLN
300.0000 mg | Freq: Once | INTRAVENOUS | Status: AC
  Administered 2021-12-09: 300 mg via INTRAVENOUS
  Filled 2021-12-09: qty 300

## 2021-12-09 MED ORDER — LORATADINE 10 MG PO TABS
10.0000 mg | ORAL_TABLET | Freq: Once | ORAL | Status: AC
  Administered 2021-12-09: 10 mg via ORAL
  Filled 2021-12-09: qty 1

## 2021-12-09 NOTE — Patient Instructions (Signed)
[facility]  Discharge Instructions: Thank you for choosing [facility] to provide your oncology and hematology care.  If you have a lab appointment with the [facility], please come in thru the Main Entrance and check in at the main information desk.  Wear comfortable clothing and clothing appropriate for easy access to any Portacath or PICC line.   We strive to give you quality time with your provider. You may need to reschedule your appointment if you arrive late (15 or more minutes).  Arriving late affects you and other patients whose appointments are after yours.  Also, if you miss three or more appointments without notifying the office, you may be dismissed from the clinic at the provider's discretion.      For prescription refill requests, have your pharmacy contact our office and allow 72 hours for refills to be completed.    Today you received the following chemotherapy and/or immunotherapy agents Venofer 300 mg . Iron Sucrose Injection What is this medication? IRON SUCROSE (EYE ern SOO krose) treats low levels of iron (iron deficiency anemia) in people with kidney disease. Iron is a mineral that plays an important role in making red blood cells, which carry oxygen from your lungs to the rest of your body. This medicine may be used for other purposes; ask your health care provider or pharmacist if you have questions. COMMON BRAND NAME(S): Venofer What should I tell my care team before I take this medication? They need to know if you have any of these conditions: Anemia not caused by low iron levels Heart disease High levels of iron in the blood Kidney disease Liver disease An unusual or allergic reaction to iron, other medications, foods, dyes, or preservatives Pregnant or trying to get pregnant Breast-feeding How should I use this medication? This medication is for infusion into a vein. It is given in a hospital or clinic setting. Talk to  your care team about the use of this medication in children. While this medication may be prescribed for children as young as 2 years for selected conditions, precautions do apply. Overdosage: If you think you have taken too much of this medicine contact a poison control center or emergency room at once. NOTE: This medicine is only for you. Do not share this medicine with others. What if I miss a dose? It is important not to miss your dose. Call your care team if you are unable to keep an appointment. What may interact with this medication? Do not take this medication with any of the following: Deferoxamine Dimercaprol Other iron products This medication may also interact with the following: Chloramphenicol Deferasirox This list may not describe all possible interactions. Give your health care provider a list of all the medicines, herbs, non-prescription drugs, or dietary supplements you use. Also tell them if you smoke, drink alcohol, or use illegal drugs. Some items may interact with your medicine. What should I watch for while using this medication? Visit your care team regularly. Tell your care team if your symptoms do not start to get better or if they get worse. You may need blood work done while you are taking this medication. You may need to follow a special diet. Talk to your care team. Foods that contain iron include: whole grains/cereals, dried fruits, beans, or peas, leafy green vegetables, and organ meats (liver, kidney). What side effects may I notice from receiving this medication? Side effects that you should report to your care team as  soon as possible: Allergic reactions--skin rash, itching, hives, swelling of the face, lips, tongue, or throat Low blood pressure--dizziness, feeling faint or lightheaded, blurry vision Shortness of breath Side effects that usually do not require medical attention (report to your care team if they continue or are  bothersome): Flushing Headache Joint pain Muscle pain Nausea Pain, redness, or irritation at injection site This list may not describe all possible side effects. Call your doctor for medical advice about side effects. You may report side effects to FDA at 1-800-FDA-1088. Where should I keep my medication? This medication is given in a hospital or clinic and will not be stored at home. NOTE: This sheet is a summary. It may not cover all possible information. If you have questions about this medicine, talk to your doctor, pharmacist, or health care provider.  2023 Elsevier/Gold Standard (2007-05-05 00:00:00)       To help prevent nausea and vomiting after your treatment, we encourage you to take your nausea medication as directed.  BELOW ARE SYMPTOMS THAT SHOULD BE REPORTED IMMEDIATELY: *FEVER GREATER THAN 100.4 F (38 C) OR HIGHER *CHILLS OR SWEATING *NAUSEA AND VOMITING THAT IS NOT CONTROLLED WITH YOUR NAUSEA MEDICATION *UNUSUAL SHORTNESS OF BREATH *UNUSUAL BRUISING OR BLEEDING *URINARY PROBLEMS (pain or burning when urinating, or frequent urination) *BOWEL PROBLEMS (unusual diarrhea, constipation, pain near the anus) TENDERNESS IN MOUTH AND THROAT WITH OR WITHOUT PRESENCE OF ULCERS (sore throat, sores in mouth, or a toothache) UNUSUAL RASH, SWELLING OR PAIN  UNUSUAL VAGINAL DISCHARGE OR ITCHING   Items with * indicate a potential emergency and should be followed up as soon as possible or go to the Emergency Department if any problems should occur.  Please show the CHEMOTHERAPY ALERT CARD or IMMUNOTHERAPY ALERT CARD at check-in to the Emergency Department and triage nurse.  Should you have questions after your visit or need to cancel or reschedule your appointment, please contact [facility] [phone]  and follow the prompts.  Office hours are 8:00 a.m. to 4:30 p.m. Monday - Friday. Please note that voicemails left after 4:00 p.m. may not be returned until  the following business day.  We are closed weekends and major holidays. You have access to a nurse at all times for urgent questions. Please call the main number to the clinic [phone] and follow the prompts.  For any non-urgent questions, you may also contact your provider using MyChart. We now offer e-Visits for anyone 59 and older to request care online for non-urgent symptoms. For details visit [email or web address].   Also download the MyChart app! Go to the app store, search "MyChart", open the app, select [facility], and log in with your MyChart username and password.  Masks are optional in the cancer centers. If you would like for your care team to wear a mask while they are taking care of you, please let them know. You may have one support person who is at least 72 years old accompany you for your appointments.

## 2021-12-09 NOTE — Progress Notes (Signed)
Venofer 337m  given today per MD orders. Tolerated infusion without adverse affects. Vital signs stable. No complaints at this time. Discharged from clinic ambulatory in stable condition. Alert and oriented x 3. [facility]

## 2021-12-14 ENCOUNTER — Inpatient Hospital Stay: Payer: Medicare PPO

## 2021-12-14 ENCOUNTER — Other Ambulatory Visit: Payer: Self-pay | Admitting: *Deleted

## 2021-12-14 VITALS — BP 136/51 | HR 80 | Temp 98.6°F | Resp 20 | Wt 294.1 lb

## 2021-12-14 DIAGNOSIS — C9000 Multiple myeloma not having achieved remission: Secondary | ICD-10-CM | POA: Diagnosis not present

## 2021-12-14 DIAGNOSIS — K7460 Unspecified cirrhosis of liver: Secondary | ICD-10-CM | POA: Diagnosis not present

## 2021-12-14 DIAGNOSIS — K922 Gastrointestinal hemorrhage, unspecified: Secondary | ICD-10-CM | POA: Diagnosis not present

## 2021-12-14 DIAGNOSIS — M129 Arthropathy, unspecified: Secondary | ICD-10-CM | POA: Diagnosis not present

## 2021-12-14 DIAGNOSIS — D3502 Benign neoplasm of left adrenal gland: Secondary | ICD-10-CM | POA: Diagnosis not present

## 2021-12-14 DIAGNOSIS — E119 Type 2 diabetes mellitus without complications: Secondary | ICD-10-CM | POA: Diagnosis not present

## 2021-12-14 DIAGNOSIS — K5900 Constipation, unspecified: Secondary | ICD-10-CM | POA: Diagnosis not present

## 2021-12-14 DIAGNOSIS — D696 Thrombocytopenia, unspecified: Secondary | ICD-10-CM | POA: Diagnosis not present

## 2021-12-14 DIAGNOSIS — D500 Iron deficiency anemia secondary to blood loss (chronic): Secondary | ICD-10-CM | POA: Diagnosis not present

## 2021-12-14 LAB — COMPREHENSIVE METABOLIC PANEL
ALT: 12 U/L (ref 0–44)
AST: 18 U/L (ref 15–41)
Albumin: 2.7 g/dL — ABNORMAL LOW (ref 3.5–5.0)
Alkaline Phosphatase: 76 U/L (ref 38–126)
Anion gap: 5 (ref 5–15)
BUN: 15 mg/dL (ref 8–23)
CO2: 27 mmol/L (ref 22–32)
Calcium: 8.2 mg/dL — ABNORMAL LOW (ref 8.9–10.3)
Chloride: 104 mmol/L (ref 98–111)
Creatinine, Ser: 0.69 mg/dL (ref 0.44–1.00)
GFR, Estimated: >60 mL/min (ref >60)
Glucose, Bld: 173 mg/dL — ABNORMAL HIGH (ref 70–99)
Potassium: 3.8 mmol/L (ref 3.5–5.1)
Sodium: 136 mmol/L (ref 135–145)
Total Bilirubin: 0.7 mg/dL (ref 0.3–1.2)
Total Protein: 8.7 g/dL — ABNORMAL HIGH (ref 6.5–8.1)

## 2021-12-14 LAB — CBC WITH DIFFERENTIAL/PLATELET
Abs Immature Granulocytes: 0.04 10*3/uL (ref 0.00–0.07)
Basophils Absolute: 0 10*3/uL (ref 0.0–0.1)
Basophils Relative: 0 %
Eosinophils Absolute: 0 10*3/uL (ref 0.0–0.5)
Eosinophils Relative: 1 %
HCT: 26.7 % — ABNORMAL LOW (ref 36.0–46.0)
Hemoglobin: 8 g/dL — ABNORMAL LOW (ref 12.0–15.0)
Immature Granulocytes: 1 %
Lymphocytes Relative: 17 %
Lymphs Abs: 1.1 10*3/uL (ref 0.7–4.0)
MCH: 29.4 pg (ref 26.0–34.0)
MCHC: 30 g/dL (ref 30.0–36.0)
MCV: 98.2 fL (ref 80.0–100.0)
Monocytes Absolute: 0.5 10*3/uL (ref 0.1–1.0)
Monocytes Relative: 8 %
Neutro Abs: 4.5 10*3/uL (ref 1.7–7.7)
Neutrophils Relative %: 73 %
Platelets: 139 10*3/uL — ABNORMAL LOW (ref 150–400)
RBC: 2.72 MIL/uL — ABNORMAL LOW (ref 3.87–5.11)
RDW: 17.6 % — ABNORMAL HIGH (ref 11.5–15.5)
WBC: 6.2 10*3/uL (ref 4.0–10.5)
nRBC: 0 % (ref 0.0–0.2)

## 2021-12-14 LAB — HEMOGLOBIN A1C
Hgb A1c MFr Bld: 5.7 % — ABNORMAL HIGH (ref 4.8–5.6)
Mean Plasma Glucose: 116.89 mg/dL

## 2021-12-14 LAB — MAGNESIUM: Magnesium: 1.7 mg/dL (ref 1.7–2.4)

## 2021-12-14 MED ORDER — DEXAMETHASONE 4 MG PO TABS: 20.0000 mg | ORAL_TABLET | ORAL | 3 refills | Status: DC

## 2021-12-14 MED ORDER — DIPHENHYDRAMINE HCL 25 MG PO CAPS
50.0000 mg | ORAL_CAPSULE | Freq: Once | ORAL | Status: AC
  Administered 2021-12-14: 50 mg via ORAL
  Filled 2021-12-14: qty 2

## 2021-12-14 MED ORDER — HEPARIN SOD (PORK) LOCK FLUSH 100 UNIT/ML IV SOLN
500.0000 [IU] | Freq: Once | INTRAVENOUS | Status: AC
  Administered 2021-12-14: 500 [IU] via INTRAVENOUS

## 2021-12-14 MED ORDER — MONTELUKAST SODIUM 10 MG PO TABS
10.0000 mg | ORAL_TABLET | Freq: Once | ORAL | Status: AC
  Administered 2021-12-14: 10 mg via ORAL
  Filled 2021-12-14: qty 1

## 2021-12-14 MED ORDER — PROCHLORPERAZINE MALEATE 10 MG PO TABS: 10.0000 mg | ORAL_TABLET | Freq: Four times a day (QID) | ORAL | 6 refills | Status: DC | PRN

## 2021-12-14 MED ORDER — ACETAMINOPHEN 325 MG PO TABS
650.0000 mg | ORAL_TABLET | Freq: Once | ORAL | Status: AC
  Administered 2021-12-14: 650 mg via ORAL
  Filled 2021-12-14: qty 2

## 2021-12-14 MED ORDER — SODIUM CHLORIDE 0.9% FLUSH
10.0000 mL | INTRAVENOUS | Status: AC
  Administered 2021-12-14: 10 mL via INTRAVENOUS

## 2021-12-14 MED ORDER — DEXAMETHASONE 4 MG PO TABS
20.0000 mg | ORAL_TABLET | Freq: Once | ORAL | Status: AC
  Administered 2021-12-14: 20 mg via ORAL
  Filled 2021-12-14: qty 5

## 2021-12-14 MED ORDER — DARATUMUMAB-HYALURONIDASE-FIHJ 1800-30000 MG-UT/15ML ~~LOC~~ SOLN
1800.0000 mg | Freq: Once | SUBCUTANEOUS | Status: AC
  Administered 2021-12-14: 1800 mg via SUBCUTANEOUS
  Filled 2021-12-14: qty 15

## 2021-12-14 NOTE — Progress Notes (Unsigned)
Immunotherapy education packet given and discussed with pt in detail.  Discussed diagnosis, staging, tx regimen, and intent of tx.  Reviewed immunotherapy medications and side effects.  Instructed on how to manage side effects at home, and when to call the clinic.  Importance of fever/chills discussed with pt and family. Discussed precautions to implement at home after receiving tx, as well as self care strategies. Phone numbers provided for clinic during regular working hours, also how to reach the clinic after hours and on weekends. Pt provided the opportunity to ask questions - all questions answered to pt's satisfaction.

## 2021-12-14 NOTE — Progress Notes (Signed)
Chaplain engaged in a follow-up visit with Savannah Burns.  Chaplain offered listening as Savannah Burns shared about her healthcare journey over the last month.  She talked about the joy in being able to stop the internal bleeding and also the reality of her new diagnosis.  She voiced, "I'm on the cancer side now.  I'll have to come here for the rest of my life."  Chaplain still sensed a great deal of hope in Savannah Burns.  She is looking forward to attending church Sunday and getting some things in order in her home.   Chaplain continues to offer reflective listening, support through community, and presence.     09 /19/23 1100  Clinical Encounter Type  Visited With Patient  Visit Type Follow-up

## 2021-12-15 ENCOUNTER — Encounter: Payer: Self-pay | Admitting: Hematology

## 2021-12-15 ENCOUNTER — Encounter (HOSPITAL_COMMUNITY): Payer: Self-pay | Admitting: Hematology

## 2021-12-21 ENCOUNTER — Inpatient Hospital Stay: Payer: Medicare PPO

## 2021-12-21 ENCOUNTER — Other Ambulatory Visit: Payer: Medicare PPO

## 2021-12-21 ENCOUNTER — Ambulatory Visit: Payer: Medicare PPO

## 2021-12-21 VITALS — BP 128/54 | HR 80 | Temp 99.0°F | Resp 20

## 2021-12-21 DIAGNOSIS — K5900 Constipation, unspecified: Secondary | ICD-10-CM | POA: Diagnosis not present

## 2021-12-21 DIAGNOSIS — M129 Arthropathy, unspecified: Secondary | ICD-10-CM | POA: Diagnosis not present

## 2021-12-21 DIAGNOSIS — K7460 Unspecified cirrhosis of liver: Secondary | ICD-10-CM | POA: Diagnosis not present

## 2021-12-21 DIAGNOSIS — C9000 Multiple myeloma not having achieved remission: Secondary | ICD-10-CM

## 2021-12-21 DIAGNOSIS — D500 Iron deficiency anemia secondary to blood loss (chronic): Secondary | ICD-10-CM | POA: Diagnosis not present

## 2021-12-21 DIAGNOSIS — E119 Type 2 diabetes mellitus without complications: Secondary | ICD-10-CM | POA: Diagnosis not present

## 2021-12-21 DIAGNOSIS — K922 Gastrointestinal hemorrhage, unspecified: Secondary | ICD-10-CM | POA: Diagnosis not present

## 2021-12-21 DIAGNOSIS — D3502 Benign neoplasm of left adrenal gland: Secondary | ICD-10-CM | POA: Diagnosis not present

## 2021-12-21 DIAGNOSIS — D696 Thrombocytopenia, unspecified: Secondary | ICD-10-CM | POA: Diagnosis not present

## 2021-12-21 LAB — CBC WITH DIFFERENTIAL/PLATELET
Abs Immature Granulocytes: 0.02 10*3/uL (ref 0.00–0.07)
Basophils Absolute: 0 10*3/uL (ref 0.0–0.1)
Basophils Relative: 0 %
Eosinophils Absolute: 0.1 10*3/uL (ref 0.0–0.5)
Eosinophils Relative: 2 %
HCT: 23.9 % — ABNORMAL LOW (ref 36.0–46.0)
Hemoglobin: 7.4 g/dL — ABNORMAL LOW (ref 12.0–15.0)
Immature Granulocytes: 1 %
Lymphocytes Relative: 14 %
Lymphs Abs: 0.6 10*3/uL — ABNORMAL LOW (ref 0.7–4.0)
MCH: 29.6 pg (ref 26.0–34.0)
MCHC: 31 g/dL (ref 30.0–36.0)
MCV: 95.6 fL (ref 80.0–100.0)
Monocytes Absolute: 0.3 10*3/uL (ref 0.1–1.0)
Monocytes Relative: 7 %
Neutro Abs: 3.1 10*3/uL (ref 1.7–7.7)
Neutrophils Relative %: 76 %
Platelets: 104 10*3/uL — ABNORMAL LOW (ref 150–400)
RBC: 2.5 MIL/uL — ABNORMAL LOW (ref 3.87–5.11)
RDW: 17.2 % — ABNORMAL HIGH (ref 11.5–15.5)
WBC: 4 10*3/uL (ref 4.0–10.5)
nRBC: 0 % (ref 0.0–0.2)

## 2021-12-21 LAB — COMPREHENSIVE METABOLIC PANEL
ALT: 15 U/L (ref 0–44)
AST: 18 U/L (ref 15–41)
Albumin: 2.6 g/dL — ABNORMAL LOW (ref 3.5–5.0)
Alkaline Phosphatase: 69 U/L (ref 38–126)
Anion gap: 6 (ref 5–15)
BUN: 15 mg/dL (ref 8–23)
CO2: 29 mmol/L (ref 22–32)
Calcium: 8 mg/dL — ABNORMAL LOW (ref 8.9–10.3)
Chloride: 102 mmol/L (ref 98–111)
Creatinine, Ser: 0.65 mg/dL (ref 0.44–1.00)
GFR, Estimated: >60 mL/min (ref >60)
Glucose, Bld: 226 mg/dL — ABNORMAL HIGH (ref 70–99)
Potassium: 3.9 mmol/L (ref 3.5–5.1)
Sodium: 137 mmol/L (ref 135–145)
Total Bilirubin: 0.5 mg/dL (ref 0.3–1.2)
Total Protein: 7.3 g/dL (ref 6.5–8.1)

## 2021-12-21 LAB — MAGNESIUM: Magnesium: 1.8 mg/dL (ref 1.7–2.4)

## 2021-12-21 MED ORDER — METHYLPREDNISOLONE SODIUM SUCC 125 MG IJ SOLR: 125.0000 mg | Freq: Once | INTRAMUSCULAR | Status: DC | PRN

## 2021-12-21 MED ORDER — SODIUM CHLORIDE 0.9 % IV SOLN: Freq: Once | INTRAVENOUS | Status: AC

## 2021-12-21 MED ORDER — SODIUM CHLORIDE 0.9 % IV SOLN
300.0000 mg | Freq: Once | INTRAVENOUS | Status: AC
  Administered 2021-12-21: 300 mg via INTRAVENOUS
  Filled 2021-12-21: qty 300

## 2021-12-21 MED ORDER — SODIUM CHLORIDE 0.9 % IV SOLN: Freq: Once | INTRAVENOUS | Status: DC | PRN

## 2021-12-21 MED ORDER — ACETAMINOPHEN 325 MG PO TABS
650.0000 mg | ORAL_TABLET | Freq: Once | ORAL | Status: AC
  Administered 2021-12-21: 650 mg via ORAL
  Filled 2021-12-21: qty 2

## 2021-12-21 MED ORDER — HEPARIN SOD (PORK) LOCK FLUSH 100 UNIT/ML IV SOLN
500.0000 [IU] | Freq: Once | INTRAVENOUS | Status: AC
  Administered 2021-12-21: 500 [IU] via INTRAVENOUS

## 2021-12-21 MED ORDER — DIPHENHYDRAMINE HCL 50 MG/ML IJ SOLN: 50.0000 mg | Freq: Once | INTRAMUSCULAR | Status: DC | PRN

## 2021-12-21 MED ORDER — SODIUM CHLORIDE 0.9% FLUSH
10.0000 mL | Freq: Once | INTRAVENOUS | Status: AC
  Administered 2021-12-21: 10 mL via INTRAVENOUS

## 2021-12-21 MED ORDER — FAMOTIDINE IN NACL 20-0.9 MG/50ML-% IV SOLN: 20.0000 mg | Freq: Once | INTRAVENOUS | Status: DC | PRN

## 2021-12-21 MED ORDER — EPINEPHRINE 0.3 MG/0.3ML IJ SOAJ: 0.3000 mg | Freq: Once | INTRAMUSCULAR | Status: DC | PRN

## 2021-12-21 MED ORDER — DARATUMUMAB-HYALURONIDASE-FIHJ 1800-30000 MG-UT/15ML ~~LOC~~ SOLN
1800.0000 mg | Freq: Once | SUBCUTANEOUS | Status: AC
  Administered 2021-12-21: 1800 mg via SUBCUTANEOUS
  Filled 2021-12-21: qty 15

## 2021-12-21 MED ORDER — LORATADINE 10 MG PO TABS: 10.0000 mg | ORAL_TABLET | Freq: Once | ORAL | Status: DC

## 2021-12-21 MED ORDER — MONTELUKAST SODIUM 10 MG PO TABS
10.0000 mg | ORAL_TABLET | Freq: Once | ORAL | Status: AC
  Administered 2021-12-21: 10 mg via ORAL
  Filled 2021-12-21: qty 1

## 2021-12-21 MED ORDER — ALBUTEROL SULFATE (2.5 MG/3ML) 0.083% IN NEBU: 2.5000 mg | INHALATION_SOLUTION | Freq: Once | RESPIRATORY_TRACT | Status: DC | PRN

## 2021-12-21 MED ORDER — DIPHENHYDRAMINE HCL 25 MG PO CAPS
50.0000 mg | ORAL_CAPSULE | Freq: Once | ORAL | Status: AC
  Administered 2021-12-21: 50 mg via ORAL
  Filled 2021-12-21: qty 2

## 2021-12-21 NOTE — Progress Notes (Signed)
Patient taking acyclovir as directed.  Has a fever blister on left lower lip that appeared two days ago.  Dr. Delton Coombes notified with order ok to proceed with treatment.    Hgb 7.4 today and provider made aware.   Ok to treat verbal order Dr. Delton Coombes.   Patient will receive a blood transfusion this week per Tarri Abernethy, PA.     Patient tolerated iron infusion with no complaints.  Port site clean and dry with no bruising or swelling noted at site.  Dressing changed with bio disc added.    Patient tolerated Daratumumab injection with no complaints voiced.  See MAR for details.  Labs reviewed. Injection site clean and dry with no bruising or swelling noted at site.  Band aid applied.  Vss with discharge and left in satisfactory condition with no s/s of distress noted.

## 2021-12-21 NOTE — Patient Instructions (Signed)
[facility]  Discharge Instructions: Thank you for choosing [facility] to provide your oncology and hematology care.  If you have a lab appointment with the [facility], please come in thru the Main Entrance and check in at the main information desk.  Wear comfortable clothing and clothing appropriate for easy access to any Portacath or PICC line.   We strive to give you quality time with your provider. You may need to reschedule your appointment if you arrive late (15 or more minutes).  Arriving late affects you and other patients whose appointments are after yours.  Also, if you miss three or more appointments without notifying the office, you may be dismissed from the clinic at the provider's discretion.      For prescription refill requests, have your pharmacy contact our office and allow 72 hours for refills to be completed.    Today you received the following chemotherapy and/or immunotherapy agents darzalex.  Daratumumab Injection What is this medication? DARATUMUMAB (dar a toom ue mab) treats multiple myeloma, a type of bone marrow cancer. It works by helping your immune system slow or stop the spread of cancer cells. It is a monoclonal antibody. This medicine may be used for other purposes; ask your health care provider or pharmacist if you have questions. COMMON BRAND NAME(S): DARZALEX What should I tell my care team before I take this medication? They need to know if you have any of these conditions: Hereditary fructose intolerance Infection, such as chickenpox, herpes, hepatitis B virus Lung or breathing disease, such as asthma, COPD An unusual or allergic reaction to daratumumab, sorbitol, other medications, foods, dyes, or preservatives Pregnant or trying to get pregnant Breast-feeding How should I use this medication? This medication is injected into a vein. It is given by your care team in a hospital or clinic setting. Talk to your care  team about the use of this medication in children. Special care may be needed. Overdosage: If you think you have taken too much of this medicine contact a poison control center or emergency room at once. NOTE: This medicine is only for you. Do not share this medicine with others. What if I miss a dose? Keep appointments for follow-up doses. It is important not to miss your dose. Call your care team if you are unable to keep an appointment. What may interact with this medication? Interactions have not been studied. This list may not describe all possible interactions. Give your health care provider a list of all the medicines, herbs, non-prescription drugs, or dietary supplements you use. Also tell them if you smoke, drink alcohol, or use illegal drugs. Some items may interact with your medicine. What should I watch for while using this medication? Your condition will be monitored carefully while you are receiving this medication. This medication can cause serious allergic reactions. To reduce your risk, your care team may give you other medication to take before receiving this one. Be sure to follow the directions from your care team. This medication can affect the results of blood tests to match your blood type. These changes can last for up to 6 months after the final dose. Your care team will do blood tests to match your blood type before you start treatment. Tell all of your care team that you are being treated with this medication before receiving a blood transfusion. This medication can affect the results of some tests used to determine treatment response; extra tests may be needed  to evaluate response. Talk to your care team if you wish to become pregnant or think you are pregnant. This medication can cause serious birth defects if taken during pregnancy and for 3 months after the last dose. A reliable form of contraception is recommended while taking this medication and for 3 months after the  last dose. Talk to your care team about effective forms of contraception. Do not breast-feed while taking this medication. What side effects may I notice from receiving this medication? Side effects that you should report to your care team as soon as possible: Allergic reactions--skin rash, itching, hives, swelling of the face, lips, tongue, or throat Infection--fever, chills, cough, sore throat, wounds that don't heal, pain or trouble when passing urine, general feeling of discomfort or being unwell Infusion reactions--chest pain, shortness of breath or trouble breathing, feeling faint or lightheaded Unusual bruising or bleeding Side effects that usually do not require medical attention (report to your care team if they continue or are bothersome): Constipation Diarrhea Fatigue Nausea Pain, tingling, or numbness in the hands or feet Swelling of the ankles, hands, or feet This list may not describe all possible side effects. Call your doctor for medical advice about side effects. You may report side effects to FDA at 1-800-FDA-1088. Where should I keep my medication? This medication is given in a hospital or clinic. It will not be stored at home. NOTE: This sheet is a summary. It may not cover all possible information. If you have questions about this medicine, talk to your doctor, pharmacist, or health care provider.  2023 Elsevier/Gold Standard (2014-02-10 00:00:00)       To help prevent nausea and vomiting after your treatment, we encourage you to take your nausea medication as directed.  BELOW ARE SYMPTOMS THAT SHOULD BE REPORTED IMMEDIATELY: *FEVER GREATER THAN 100.4 F (38 C) OR HIGHER *CHILLS OR SWEATING *NAUSEA AND VOMITING THAT IS NOT CONTROLLED WITH YOUR NAUSEA MEDICATION *UNUSUAL SHORTNESS OF BREATH *UNUSUAL BRUISING OR BLEEDING *URINARY PROBLEMS (pain or burning when urinating, or frequent urination) *BOWEL PROBLEMS (unusual diarrhea, constipation, pain near the  anus) TENDERNESS IN MOUTH AND THROAT WITH OR WITHOUT PRESENCE OF ULCERS (sore throat, sores in mouth, or a toothache) UNUSUAL RASH, SWELLING OR PAIN  UNUSUAL VAGINAL DISCHARGE OR ITCHING   Items with * indicate a potential emergency and should be followed up as soon as possible or go to the Emergency Department if any problems should occur.  Please show the CHEMOTHERAPY ALERT CARD or IMMUNOTHERAPY ALERT CARD at check-in to the Emergency Department and triage nurse.  Should you have questions after your visit or need to cancel or reschedule your appointment, please contact [facility] [phone]  and follow the prompts.  Office hours are 8:00 a.m. to 4:30 p.m. Monday - Friday. Please note that voicemails left after 4:00 p.m. may not be returned until the following business day.  We are closed weekends and major holidays. You have access to a nurse at all times for urgent questions. Please call the main number to the clinic [phone] and follow the prompts.  For any non-urgent questions, you may also contact your provider using MyChart. We now offer e-Visits for anyone 31 and older to request care online for non-urgent symptoms. For details visit [email or web address].   Also download the MyChart app! Go to the app store, search "MyChart", open the app, select [facility], and log in with your MyChart username and password.  Masks are optional in the cancer centers.  If you would like for your care team to wear a mask while they are taking care of you, please let them know. You may have one support person who is at least 72 years old accompany you for your appointments.

## 2021-12-22 ENCOUNTER — Inpatient Hospital Stay: Payer: Medicare PPO

## 2021-12-22 VITALS — BP 137/67 | HR 101 | Temp 97.8°F | Resp 20

## 2021-12-22 DIAGNOSIS — D500 Iron deficiency anemia secondary to blood loss (chronic): Secondary | ICD-10-CM

## 2021-12-22 DIAGNOSIS — D3502 Benign neoplasm of left adrenal gland: Secondary | ICD-10-CM | POA: Diagnosis not present

## 2021-12-22 DIAGNOSIS — C9000 Multiple myeloma not having achieved remission: Secondary | ICD-10-CM

## 2021-12-22 DIAGNOSIS — D696 Thrombocytopenia, unspecified: Secondary | ICD-10-CM | POA: Diagnosis not present

## 2021-12-22 DIAGNOSIS — M129 Arthropathy, unspecified: Secondary | ICD-10-CM | POA: Diagnosis not present

## 2021-12-22 DIAGNOSIS — K7460 Unspecified cirrhosis of liver: Secondary | ICD-10-CM | POA: Diagnosis not present

## 2021-12-22 DIAGNOSIS — E119 Type 2 diabetes mellitus without complications: Secondary | ICD-10-CM | POA: Diagnosis not present

## 2021-12-22 DIAGNOSIS — K922 Gastrointestinal hemorrhage, unspecified: Secondary | ICD-10-CM | POA: Diagnosis not present

## 2021-12-22 DIAGNOSIS — K5900 Constipation, unspecified: Secondary | ICD-10-CM | POA: Diagnosis not present

## 2021-12-22 LAB — PREPARE RBC (CROSSMATCH)

## 2021-12-22 MED ORDER — SODIUM CHLORIDE 0.9% IV SOLUTION
250.0000 mL | Freq: Once | INTRAVENOUS | Status: AC
  Administered 2021-12-22: 250 mL via INTRAVENOUS

## 2021-12-22 MED ORDER — SODIUM CHLORIDE 0.9% FLUSH: 10.0000 mL | INTRAVENOUS | Status: DC | PRN

## 2021-12-22 MED ORDER — SODIUM CHLORIDE 0.9% FLUSH
10.0000 mL | INTRAVENOUS | Status: AC | PRN
  Administered 2021-12-22: 10 mL

## 2021-12-22 MED ORDER — ACETAMINOPHEN 325 MG PO TABS
650.0000 mg | ORAL_TABLET | Freq: Once | ORAL | Status: AC
  Administered 2021-12-22: 650 mg via ORAL
  Filled 2021-12-22: qty 2

## 2021-12-22 MED ORDER — DIPHENHYDRAMINE HCL 25 MG PO CAPS: 25.0000 mg | ORAL_CAPSULE | Freq: Once | ORAL | Status: DC

## 2021-12-22 MED ORDER — HEPARIN SOD (PORK) LOCK FLUSH 100 UNIT/ML IV SOLN
500.0000 [IU] | Freq: Every day | INTRAVENOUS | Status: AC | PRN
  Administered 2021-12-22: 500 [IU]

## 2021-12-22 NOTE — Progress Notes (Signed)
Patient presents today for one unit of PRBC.  Patient is in satisfactory condition with no new complaints voiced.  Vital signs are stable.  We will proceed with transfusion per provider orders.  Patient will not receive blood transfusion today as she now has antibodies.  Patient will RTC tomorrow for transfusion.  Patient left via wheelchair with granddaughter in stable condition.

## 2021-12-22 NOTE — Patient Instructions (Signed)
[facility]  Discharge Instructions: Thank you for choosing [facility] to provide your oncology and hematology care.  If you have a lab appointment with the Cancer Center, please come in thru the Main Entrance and check in at the main information desk.  Wear comfortable clothing and clothing appropriate for easy access to any Portacath or PICC line.   We strive to give you quality time with your provider. You may need to reschedule your appointment if you arrive late (15 or more minutes).  Arriving late affects you and other patients whose appointments are after yours.  Also, if you miss three or more appointments without notifying the office, you may be dismissed from the clinic at the provider's discretion.      For prescription refill requests, have your pharmacy contact our office and allow 72 hours for refills to be completed.     To help prevent nausea and vomiting after your treatment, we encourage you to take your nausea medication as directed.  BELOW ARE SYMPTOMS THAT SHOULD BE REPORTED IMMEDIATELY: *FEVER GREATER THAN 100.4 F (38 C) OR HIGHER *CHILLS OR SWEATING *NAUSEA AND VOMITING THAT IS NOT CONTROLLED WITH YOUR NAUSEA MEDICATION *UNUSUAL SHORTNESS OF BREATH *UNUSUAL BRUISING OR BLEEDING *URINARY PROBLEMS (pain or burning when urinating, or frequent urination) *BOWEL PROBLEMS (unusual diarrhea, constipation, pain near the anus) TENDERNESS IN MOUTH AND THROAT WITH OR WITHOUT PRESENCE OF ULCERS (sore throat, sores in mouth, or a toothache) UNUSUAL RASH, SWELLING OR PAIN  UNUSUAL VAGINAL DISCHARGE OR ITCHING   Items with * indicate a potential emergency and should be followed up as soon as possible or go to the Emergency Department if any problems should occur.  Please show the CHEMOTHERAPY ALERT CARD or IMMUNOTHERAPY ALERT CARD at check-in to the Emergency Department and triage nurse.  Should you have questions after your visit or need to  cancel or reschedule your appointment, please contact [facility] [phone]  and follow the prompts.  Office hours are 8:00 a.m. to 4:30 p.m. Monday - Friday. Please note that voicemails left after 4:00 p.m. may not be returned until the following business day.  We are closed weekends and major holidays. You have access to a nurse at all times for urgent questions. Please call the main number to the clinic [phone] and follow the prompts.  For any non-urgent questions, you may also contact your provider using MyChart. We now offer e-Visits for anyone 18 and older to request care online for non-urgent symptoms. For details visit [email or web address].   Also download the MyChart app! Go to the app store, search "MyChart", open the app, select [facility], and log in with your MyChart username and password.  Masks are optional in the cancer centers. If you would like for your care team to wear a mask while they are taking care of you, please let them know. You may have one support person who is at least 72 years old accompany you for your appointments.  

## 2021-12-23 ENCOUNTER — Inpatient Hospital Stay: Payer: Medicare PPO

## 2021-12-23 DIAGNOSIS — K922 Gastrointestinal hemorrhage, unspecified: Secondary | ICD-10-CM | POA: Diagnosis not present

## 2021-12-23 DIAGNOSIS — C9000 Multiple myeloma not having achieved remission: Secondary | ICD-10-CM | POA: Diagnosis not present

## 2021-12-23 DIAGNOSIS — K5900 Constipation, unspecified: Secondary | ICD-10-CM | POA: Diagnosis not present

## 2021-12-23 DIAGNOSIS — E119 Type 2 diabetes mellitus without complications: Secondary | ICD-10-CM | POA: Diagnosis not present

## 2021-12-23 DIAGNOSIS — D696 Thrombocytopenia, unspecified: Secondary | ICD-10-CM | POA: Diagnosis not present

## 2021-12-23 DIAGNOSIS — D500 Iron deficiency anemia secondary to blood loss (chronic): Secondary | ICD-10-CM | POA: Diagnosis not present

## 2021-12-23 DIAGNOSIS — D3502 Benign neoplasm of left adrenal gland: Secondary | ICD-10-CM | POA: Diagnosis not present

## 2021-12-23 DIAGNOSIS — M129 Arthropathy, unspecified: Secondary | ICD-10-CM | POA: Diagnosis not present

## 2021-12-23 DIAGNOSIS — K7460 Unspecified cirrhosis of liver: Secondary | ICD-10-CM | POA: Diagnosis not present

## 2021-12-23 LAB — CBC WITH DIFFERENTIAL/PLATELET
Abs Immature Granulocytes: 0.01 10*3/uL (ref 0.00–0.07)
Basophils Absolute: 0 10*3/uL (ref 0.0–0.1)
Basophils Relative: 0 %
Eosinophils Absolute: 0.1 10*3/uL (ref 0.0–0.5)
Eosinophils Relative: 2 %
HCT: 26.8 % — ABNORMAL LOW (ref 36.0–46.0)
Hemoglobin: 8 g/dL — ABNORMAL LOW (ref 12.0–15.0)
Immature Granulocytes: 0 %
Lymphocytes Relative: 14 %
Lymphs Abs: 0.7 10*3/uL (ref 0.7–4.0)
MCH: 28.7 pg (ref 26.0–34.0)
MCHC: 29.9 g/dL — ABNORMAL LOW (ref 30.0–36.0)
MCV: 96.1 fL (ref 80.0–100.0)
Monocytes Absolute: 0.6 10*3/uL (ref 0.1–1.0)
Monocytes Relative: 11 %
Neutro Abs: 3.6 10*3/uL (ref 1.7–7.7)
Neutrophils Relative %: 73 %
Platelets: 121 10*3/uL — ABNORMAL LOW (ref 150–400)
RBC: 2.79 MIL/uL — ABNORMAL LOW (ref 3.87–5.11)
RDW: 17.5 % — ABNORMAL HIGH (ref 11.5–15.5)
WBC: 4.9 10*3/uL (ref 4.0–10.5)
nRBC: 0 % (ref 0.0–0.2)

## 2021-12-24 ENCOUNTER — Inpatient Hospital Stay: Payer: Medicare PPO

## 2021-12-24 ENCOUNTER — Other Ambulatory Visit (HOSPITAL_COMMUNITY): Payer: Self-pay | Admitting: Hematology

## 2021-12-24 VITALS — BP 113/62 | HR 68 | Temp 98.2°F | Resp 18

## 2021-12-24 DIAGNOSIS — C9000 Multiple myeloma not having achieved remission: Secondary | ICD-10-CM | POA: Diagnosis not present

## 2021-12-24 DIAGNOSIS — E119 Type 2 diabetes mellitus without complications: Secondary | ICD-10-CM | POA: Diagnosis not present

## 2021-12-24 DIAGNOSIS — K7460 Unspecified cirrhosis of liver: Secondary | ICD-10-CM | POA: Diagnosis not present

## 2021-12-24 DIAGNOSIS — D696 Thrombocytopenia, unspecified: Secondary | ICD-10-CM | POA: Diagnosis not present

## 2021-12-24 DIAGNOSIS — D500 Iron deficiency anemia secondary to blood loss (chronic): Secondary | ICD-10-CM | POA: Diagnosis not present

## 2021-12-24 DIAGNOSIS — K5900 Constipation, unspecified: Secondary | ICD-10-CM | POA: Diagnosis not present

## 2021-12-24 DIAGNOSIS — M129 Arthropathy, unspecified: Secondary | ICD-10-CM | POA: Diagnosis not present

## 2021-12-24 DIAGNOSIS — K922 Gastrointestinal hemorrhage, unspecified: Secondary | ICD-10-CM | POA: Diagnosis not present

## 2021-12-24 DIAGNOSIS — D3502 Benign neoplasm of left adrenal gland: Secondary | ICD-10-CM | POA: Diagnosis not present

## 2021-12-24 MED ORDER — SODIUM CHLORIDE 0.9% IV SOLUTION
250.0000 mL | Freq: Once | INTRAVENOUS | Status: AC
  Administered 2021-12-24: 250 mL via INTRAVENOUS

## 2021-12-24 MED ORDER — ACETAMINOPHEN 325 MG PO TABS
650.0000 mg | ORAL_TABLET | Freq: Once | ORAL | Status: AC
  Administered 2021-12-24: 650 mg via ORAL
  Filled 2021-12-24: qty 2

## 2021-12-24 MED ORDER — SODIUM CHLORIDE 0.9% FLUSH
10.0000 mL | INTRAVENOUS | Status: AC | PRN
  Administered 2021-12-24: 10 mL

## 2021-12-24 MED ORDER — HEPARIN SOD (PORK) LOCK FLUSH 100 UNIT/ML IV SOLN
500.0000 [IU] | Freq: Every day | INTRAVENOUS | Status: AC | PRN
  Administered 2021-12-24: 500 [IU]

## 2021-12-24 NOTE — Progress Notes (Signed)
Pt presents today for 1 unit of PRBC's per provider's order. Vital signs stable and pt voiced no new complaints at this time.   1 unit of PRBC's given today per MD orders. Tolerated infusion without adverse affects. Vital signs stable. No complaints at this time. Discharged from clinic via wheelchair in stable condition. Alert and oriented x 3. F/U with [facility] as scheduled.

## 2021-12-25 LAB — TYPE AND SCREEN
ABO/RH(D): A POS
Antibody Screen: POSITIVE
DAT, IgG: NEGATIVE
Unit division: 0

## 2021-12-25 LAB — BPAM RBC
Blood Product Expiration Date: 202311022359
ISSUE DATE / TIME: 202309290919
Unit Type and Rh: 6200

## 2021-12-27 ENCOUNTER — Other Ambulatory Visit: Payer: Self-pay

## 2021-12-27 DIAGNOSIS — C9000 Multiple myeloma not having achieved remission: Secondary | ICD-10-CM

## 2021-12-28 ENCOUNTER — Inpatient Hospital Stay: Payer: Medicare PPO

## 2021-12-28 ENCOUNTER — Inpatient Hospital Stay: Payer: Medicare PPO | Attending: Hematology | Admitting: Hematology

## 2021-12-28 ENCOUNTER — Other Ambulatory Visit: Payer: Self-pay

## 2021-12-28 DIAGNOSIS — E119 Type 2 diabetes mellitus without complications: Secondary | ICD-10-CM | POA: Diagnosis not present

## 2021-12-28 DIAGNOSIS — R197 Diarrhea, unspecified: Secondary | ICD-10-CM | POA: Diagnosis not present

## 2021-12-28 DIAGNOSIS — R918 Other nonspecific abnormal finding of lung field: Secondary | ICD-10-CM | POA: Diagnosis not present

## 2021-12-28 DIAGNOSIS — J189 Pneumonia, unspecified organism: Secondary | ICD-10-CM | POA: Insufficient documentation

## 2021-12-28 DIAGNOSIS — M19041 Primary osteoarthritis, right hand: Secondary | ICD-10-CM | POA: Diagnosis not present

## 2021-12-28 DIAGNOSIS — D3502 Benign neoplasm of left adrenal gland: Secondary | ICD-10-CM | POA: Diagnosis not present

## 2021-12-28 DIAGNOSIS — I10 Essential (primary) hypertension: Secondary | ICD-10-CM | POA: Diagnosis not present

## 2021-12-28 DIAGNOSIS — Z87442 Personal history of urinary calculi: Secondary | ICD-10-CM | POA: Insufficient documentation

## 2021-12-28 DIAGNOSIS — Z79899 Other long term (current) drug therapy: Secondary | ICD-10-CM | POA: Insufficient documentation

## 2021-12-28 DIAGNOSIS — C9000 Multiple myeloma not having achieved remission: Secondary | ICD-10-CM

## 2021-12-28 DIAGNOSIS — D500 Iron deficiency anemia secondary to blood loss (chronic): Secondary | ICD-10-CM

## 2021-12-28 DIAGNOSIS — Z800 Family history of malignant neoplasm of digestive organs: Secondary | ICD-10-CM | POA: Insufficient documentation

## 2021-12-28 DIAGNOSIS — K7460 Unspecified cirrhosis of liver: Secondary | ICD-10-CM | POA: Insufficient documentation

## 2021-12-28 DIAGNOSIS — Z85828 Personal history of other malignant neoplasm of skin: Secondary | ICD-10-CM | POA: Insufficient documentation

## 2021-12-28 DIAGNOSIS — E538 Deficiency of other specified B group vitamins: Secondary | ICD-10-CM | POA: Insufficient documentation

## 2021-12-28 DIAGNOSIS — M19042 Primary osteoarthritis, left hand: Secondary | ICD-10-CM | POA: Insufficient documentation

## 2021-12-28 DIAGNOSIS — Z5112 Encounter for antineoplastic immunotherapy: Secondary | ICD-10-CM | POA: Diagnosis not present

## 2021-12-28 DIAGNOSIS — B001 Herpesviral vesicular dermatitis: Secondary | ICD-10-CM | POA: Insufficient documentation

## 2021-12-28 DIAGNOSIS — E785 Hyperlipidemia, unspecified: Secondary | ICD-10-CM | POA: Diagnosis not present

## 2021-12-28 DIAGNOSIS — Z87891 Personal history of nicotine dependence: Secondary | ICD-10-CM | POA: Insufficient documentation

## 2021-12-28 LAB — COMPREHENSIVE METABOLIC PANEL
ALT: 16 U/L (ref 0–44)
AST: 15 U/L (ref 15–41)
Albumin: 2.9 g/dL — ABNORMAL LOW (ref 3.5–5.0)
Alkaline Phosphatase: 79 U/L (ref 38–126)
Anion gap: 7 (ref 5–15)
BUN: 11 mg/dL (ref 8–23)
CO2: 26 mmol/L (ref 22–32)
Calcium: 8 mg/dL — ABNORMAL LOW (ref 8.9–10.3)
Chloride: 103 mmol/L (ref 98–111)
Creatinine, Ser: 0.51 mg/dL (ref 0.44–1.00)
GFR, Estimated: >60 mL/min (ref >60)
Glucose, Bld: 137 mg/dL — ABNORMAL HIGH (ref 70–99)
Potassium: 3.6 mmol/L (ref 3.5–5.1)
Sodium: 136 mmol/L (ref 135–145)
Total Bilirubin: 0.7 mg/dL (ref 0.3–1.2)
Total Protein: 6.8 g/dL (ref 6.5–8.1)

## 2021-12-28 LAB — CBC WITH DIFFERENTIAL/PLATELET
Abs Immature Granulocytes: 0.01 10*3/uL (ref 0.00–0.07)
Basophils Absolute: 0 10*3/uL (ref 0.0–0.1)
Basophils Relative: 0 %
Eosinophils Absolute: 0.1 10*3/uL (ref 0.0–0.5)
Eosinophils Relative: 2 %
HCT: 29.8 % — ABNORMAL LOW (ref 36.0–46.0)
Hemoglobin: 8.9 g/dL — ABNORMAL LOW (ref 12.0–15.0)
Immature Granulocytes: 0 %
Lymphocytes Relative: 19 %
Lymphs Abs: 0.5 10*3/uL — ABNORMAL LOW (ref 0.7–4.0)
MCH: 28.3 pg (ref 26.0–34.0)
MCHC: 29.9 g/dL — ABNORMAL LOW (ref 30.0–36.0)
MCV: 94.6 fL (ref 80.0–100.0)
Monocytes Absolute: 0.3 10*3/uL (ref 0.1–1.0)
Monocytes Relative: 10 %
Neutro Abs: 1.8 10*3/uL (ref 1.7–7.7)
Neutrophils Relative %: 69 %
Platelets: 100 10*3/uL — ABNORMAL LOW (ref 150–400)
RBC: 3.15 MIL/uL — ABNORMAL LOW (ref 3.87–5.11)
RDW: 17.6 % — ABNORMAL HIGH (ref 11.5–15.5)
WBC: 2.6 10*3/uL — ABNORMAL LOW (ref 4.0–10.5)
nRBC: 0 % (ref 0.0–0.2)

## 2021-12-28 LAB — MAGNESIUM: Magnesium: 1.8 mg/dL (ref 1.7–2.4)

## 2021-12-28 LAB — SAMPLE TO BLOOD BANK

## 2021-12-28 MED ORDER — SODIUM CHLORIDE 0.9% FLUSH
10.0000 mL | INTRAVENOUS | Status: DC | PRN
  Administered 2021-12-28: 10 mL via INTRAVENOUS

## 2021-12-28 MED ORDER — DARATUMUMAB-HYALURONIDASE-FIHJ 1800-30000 MG-UT/15ML ~~LOC~~ SOLN
1800.0000 mg | Freq: Once | SUBCUTANEOUS | Status: AC
  Administered 2021-12-28: 1800 mg via SUBCUTANEOUS
  Filled 2021-12-28: qty 15

## 2021-12-28 MED ORDER — HEPARIN SOD (PORK) LOCK FLUSH 100 UNIT/ML IV SOLN
500.0000 [IU] | Freq: Once | INTRAVENOUS | Status: AC
  Administered 2021-12-28: 500 [IU] via INTRAVENOUS

## 2021-12-28 MED ORDER — LENALIDOMIDE 15 MG PO CAPS: 15.0000 mg | ORAL_CAPSULE | Freq: Every day | ORAL | 0 refills | Status: DC

## 2021-12-28 NOTE — Telephone Encounter (Signed)
Chart reviewed. Revlimid refilled per verbal order from Dr. Delton Coombes.

## 2021-12-28 NOTE — Patient Instructions (Addendum)
[facility] at [facility] Discharge Instructions   You were seen and examined today by Dr. Delton Coombes.  He reviewed the results of your lab work which are normal/stable.   We will proceed with your treatment today.   Your tiredness is likely coming from the treatment itself and should improve over time (in a month or two). Your hemoglobin today is 8.9. We will continue to give blood transfusions as needed.   Return as scheduled.    Thank you for choosing [facility] at [facility] to provide your oncology and hematology care.  To afford each patient quality time with our provider, please arrive at least 15 minutes before your scheduled appointment time.   If you have a lab appointment with the [facility] please come in thru the Main Entrance and check in at the main information desk.  You need to re-schedule your appointment should you arrive 10 or more minutes late.  We strive to give you quality time with our providers, and arriving late affects you and other patients whose appointments are after yours.  Also, if you no show three or more times for appointments you may be dismissed from the clinic at the providers discretion.     Again, thank you for choosing [facility].  Our hope is that these requests will decrease the amount of time that you wait before being seen by our physicians.       _____________________________________________________________  Should you have questions after your visit to [facility], please contact our office at [phone] and follow the prompts.  Our office hours are 8:00 a.m. and 4:30 p.m. Monday - Friday.  Please note that voicemails left after 4:00 p.m. may not be returned until the following business day.  We are closed weekends and major holidays.  You do have access to a nurse 24-7, just call the main number to the clinic [phone] and do not press any options, hold on the  line and a nurse will answer the phone.    For prescription refill requests, have your pharmacy contact our office and allow 72 hours.    Due to Covid, you will need to wear a mask upon entering the hospital. If you do not have a mask, a mask will be given to you at the Main Entrance upon arrival. For doctor visits, patients may have 1 support person age 52 or older with them. For treatment visits, patients can not have anyone with them due to social distancing guidelines and our immunocompromised population.

## 2021-12-28 NOTE — Progress Notes (Signed)
Patient is taking Revlimid as prescribed.  She has not missed any doses and reports no side effects at this time.    Patient has been examined by Dr. Delton Coombes, and vital signs and labs have been reviewed. ANC, Creatinine, LFTs, hemoglobin, and platelets are within treatment parameters per M.D. - pt may proceed with treatment.  Primary RN and pharmacy notified.

## 2021-12-28 NOTE — Progress Notes (Signed)
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  PRIOR THERAPY: None  NGS Results: Not applicable  CURRENT THERAPY: Daratumumab, lenalidomide and dexamethasone  BRIEF ONCOLOGIC HISTORY:  Oncology History  Multiple myeloma ([place])  11/30/2021 Initial Diagnosis   Multiple myeloma ([place])   11/30/2021 Cancer Staging   Staging form: Plasma Cell Myeloma and Plasma Cell Disorders, AJCC 8th Edition - Clinical stage from 11/30/2021: RISS Stage II (Beta-2-microglobulin (mg/L): 2.8, Albumin (g/dL): 3, ISS: Stage II, High-risk cytogenetics: Absent, LDH: Normal) - Signed by Derek Jack, MD on 11/30/2021 Histopathologic type: Multiple myeloma Stage prefix: Initial diagnosis Beta 2 microglobulin range (mg/L): Less than 3.5 Albumin range (g/dL): Less than 3.5 Cytogenetics: t(11;14) translocation Serum calcium level: Normal   12/14/2021 -  Chemotherapy   Patient is on Treatment Plan : MYELOMA  Daratumumab SQ + Lenalidomide + Dexamethasone (DaraRd) q28d       CANCER STAGING:  Cancer Staging  Multiple myeloma ([place]) Staging form: Plasma Cell Myeloma and Plasma Cell Disorders, AJCC 8th Edition - Clinical stage from 11/30/2021: RISS Stage II (Beta-2-microglobulin (mg/L): 2.8, Albumin (g/dL): 3, ISS: Stage II, High-risk cytogenetics: Absent, LDH: Normal) - Signed by Derek Jack, MD on 11/30/2021    INTERVAL HISTORY:  [name] 71 y.o. female seen for follow-up of multiple myeloma.  She was started on cycle 1 day 1 of daratumumab, lenalidomide and dexamethasone on 12/14/2021.  She reported decrease in energy levels.  She had to receive 1 unit of PRBC on 12/24/2021.  She had diarrhea x3 today.  She also developed a cold sore after the first injection of Darzalex.   She is taking dexamethasone 20 mg at home.    REVIEW OF SYSTEMS:  Review of Systems  Respiratory:  Positive for shortness of breath (On 3 L nasal canula).   Gastrointestinal:  Positive for diarrhea and nausea.  All other systems reviewed and are negative.    PAST MEDICAL/SURGICAL HISTORY:  Past Medical History:  Diagnosis Date   Adrenal adenoma, left    Stable   Anxiety    Arthritis    bilateral hands   Depression    Diabetes mellitus, type 2 ([facility]) 08/12/2008   Qualifier: Diagnosis of  By: Deborra Medina MD, Talia     Dyspnea    Esophageal varices (HCC)    Grade II diastolic dysfunction    History of kidney stones    Hyperlipidemia    Hypertension    Lower back pain    Lower GI bleed 03/19/2020   Panic attacks    Pneumonia    currently taking antibiotic and prednisone for early stages of pneumonia   Pulmonary nodules    bilateral   Skin cancer    face   Past Surgical History:  Procedure Laterality Date   BIOPSY  04/07/2020   Procedure: BIOPSY;  Surgeon: Eloise Harman, DO;  [facility];  Service: Endoscopy;;   Breast Cystectomy  Right    CESAREAN SECTION     COLONOSCOPY WITH PROPOFOL N/A 01/25/2020   Dr. Abbey Chatters: Nonbleeding internal hemorrhoids, diverticulosis, 5 mm polyp removed from the ascending colon, 10 mm polyp removed from the sigmoid colon, 30 mm polyp (tubulovillous adenoma with no high-grade dysplasia) removed from the transverse colon via piecemeal status post tattoo.  Other polyps were tubular adenomas.  3 month surveillance colonoscopy recommended.   COLONOSCOPY WITH PROPOFOL N/A 04/07/2020   Procedure: COLONOSCOPY WITH PROPOFOL;  Surgeon: Eloise Harman, DO;  [facility];  Service: Endoscopy;  Laterality: N/A;  3:00pm, pt knows new time per office   CYSTOSCOPY/URETEROSCOPY/HOLMIUM LASER/STENT PLACEMENT Bilateral 03/01/2019   Procedure: CYSTOSCOPY/RETROGRADEURETEROSCOPY/HOLMIUM LASER/STENT PLACEMENT;  Surgeon: Ceasar Mons,  MD;  Location: [facility];  Service: Urology;  Laterality: Bilateral;  ONLY NEEDS 60 MIN   ESOPHAGOGASTRODUODENOSCOPY (EGD) WITH PROPOFOL N/A 01/25/2020   Dr. Abbey Chatters: 4 columns grade 1 esophageal varices   ESOPHAGOGASTRODUODENOSCOPY (EGD) WITH PROPOFOL N/A 05/18/2020   Procedure: ESOPHAGOGASTRODUODENOSCOPY (EGD) WITH PROPOFOL;  Surgeon: Eloise Harman, DO;  [facility];  Service: Endoscopy;  Laterality: N/A;   ESOPHAGOGASTRODUODENOSCOPY (EGD) WITH PROPOFOL N/A 07/28/2020   Procedure: ESOPHAGOGASTRODUODENOSCOPY (EGD) WITH PROPOFOL;  Surgeon: Eloise Harman, DO;  [facility];  Service: Endoscopy;  Laterality: N/A;  am or early PM due to givens capsule placement   GIVENS CAPSULE STUDY N/A 05/18/2020   Procedure: [place];  Surgeon: Harvel Quale, MD;  [facility];  Service: Gastroenterology;  Laterality: N/A;   GIVENS CAPSULE STUDY N/A 07/28/2020   Procedure: GIVENS CAPSULE STUDY;  Surgeon: Eloise Harman, DO;  [facility];  Service: Endoscopy;  Laterality: N/A;   IR IMAGING GUIDED PORT INSERTION  12/02/2021   POLYPECTOMY  01/25/2020   Procedure: POLYPECTOMY;  Surgeon: Eloise Harman, DO;  [facility];  Service: Endoscopy;;   POLYPECTOMY  04/07/2020   Procedure: POLYPECTOMY INTESTINAL;  Surgeon: Eloise Harman, DO;  [facility];  Service: Endoscopy;;   SKIN CANCER EXCISION     Face   SPINE SURGERY     SUBMUCOSAL TATTOO INJECTION  01/25/2020   Procedure: SUBMUCOSAL TATTOO INJECTION;  Surgeon: Eloise Harman, DO;  [facility];  Service: Endoscopy;;     SOCIAL HISTORY:  Social History   Socioeconomic History   Marital status: Widowed    Spouse name: Not on file   Number of children: 2   Years of education: 14   Highest education level: Not on file  Occupational History   Occupation: Retired   Tobacco Use   Smoking status: Former    Packs/day: 1.50    Years: 40.00    Total  pack years: 60.00    Types: Cigarettes    Quit date: 04/29/2015    Years since quitting: 6.6   Smokeless tobacco: Never   Tobacco comments:    Quit smoking 04/2015- Previous 1.5 ppd smoker  Vaping Use   Vaping Use: Never used  Substance and Sexual Activity   Alcohol use: No    Alcohol/week: 0.0 standard drinks of alcohol   Drug use: No   Sexual activity: Not Currently    Birth control/protection: Post-menopausal  Other Topics Concern   Not on file  Social History Narrative   Her 66 year old granddaughter lives with her - one daughter lives nearby, but she doesn't have a good relationship with her. Has a great relationship with other daughter who lives 1.5 hrs away - talks to her daily on the phone.   Social Determinants of Health   Financial Resource Strain: Medium Risk (07/27/2021)   Overall Financial Resource Strain (CARDIA)    Difficulty of Paying Living Expenses: Somewhat hard  Food Insecurity: Food Insecurity Present (07/27/2021)   Hunger Vital Sign  Worried About Charity fundraiser in the Last Year: Sometimes true    [employer] of Food in the Last Year: Never true  Transportation Needs: No Transportation Needs (07/27/2021)   PRAPARE - Hydrologist (Medical): No    Lack of Transportation (Non-Medical): No  Physical Activity: Inactive (07/27/2021)   Exercise Vital Sign    Days of Exercise per Week: 0 days    Minutes of Exercise per Session: 0 min  Stress: Stress Concern Present (07/27/2021)   [place]    Feeling of Stress : To some extent  Social Connections: Socially Isolated (07/27/2021)   Social Connection and Isolation Panel [NHANES]    Frequency of Communication with Friends and Family: More than three times a week    Frequency of Social Gatherings with Friends and Family: Once a week    Attends Religious Services: Never    Marine scientist or Organizations: No    Attends English as a second language teacher Meetings: Never    Marital Status: Widowed  Intimate Partner Violence: Not At Risk (07/27/2021)   Humiliation, Afraid, Rape, and Kick questionnaire    Fear of Current or Ex-Partner: No    Emotionally Abused: No    Physically Abused: No    Sexually Abused: No    FAMILY HISTORY:  Family History  Problem Relation Age of Onset   Diabetes Father    Heart disease Father 46       MI   Hypertension Father    Anemia Mother        Transfusion dependent   COPD Sister    Cancer Paternal Grandmother 23       Pancreatic    CURRENT MEDICATIONS:  Outpatient Encounter Medications as of 12/28/2021  Medication Sig Note   acyclovir (ZOVIRAX) 400 MG tablet Take 1 tablet (400 mg total) by mouth 2 (two) times daily.    Alcohol Swabs (B-D SINGLE USE SWABS REGULAR) PADS Check BS daily Dx E11.9    Blood Glucose Calibration (TRUE METRIX LEVEL 1) Low SOLN Use w/ glucose monitor Dx E11.9    Blood Glucose Monitoring Suppl (TRUE METRIX AIR GLUCOSE METER) w/Device KIT Check BS daily Dx E11.9    carvedilol (COREG) 6.25 MG tablet Take 6.25 mg by mouth 2 (two) times daily.    Cholecalciferol (VITAMIN D) 50 MCG (2000 UT) tablet Take 2,000 Units by mouth daily.    CONSTULOSE 10 GM/15ML solution Take by mouth.    Cyanocobalamin (B-12 COMPLIANCE INJECTION) 1000 MCG/ML KIT Inject 1,000 mcg as directed every 30 (thirty) days.    Cyanocobalamin (VITAMIN B-12 IJ) Inject 1,000 mcg as directed every 30 (thirty) days.    daratumumab-hyaluronidase-fihj (DARZALEX FASPRO) 1800-30000 MG-UT/15ML SOLN Inject 1,800 mg into the skin once a week.    desvenlafaxine (PRISTIQ) 50 MG 24 hr tablet Take 1 tablet (50 mg total) by mouth daily.    dexamethasone (DECADRON) 4 MG tablet Take 5 tablets (20 mg total) by mouth once a week.    esomeprazole (NEXIUM) 20 MG capsule Take 1 capsule (20 mg total) by mouth daily at 12 noon.    furosemide (LASIX) 20 MG tablet Take 20 mg by mouth. bid    glucose blood (TRUE METRIX BLOOD  GLUCOSE TEST) test strip Check BS daily Dx E11.9    levofloxacin (LEVAQUIN) 750 MG tablet Take 1 tablet (750 mg total) by mouth daily.    lidocaine-prilocaine (EMLA) cream Apply 1 Application topically  as needed. Apply a small amount to port a cath site and cover with plastic wrap 1 hour prior to infusion appointments    lovastatin (MEVACOR) 20 MG tablet Take 1 tablet (20 mg total) by mouth at bedtime.    omeprazole (PRILOSEC) 20 MG capsule Take 20 mg by mouth 2 (two) times daily.    oxyCODONE-acetaminophen (PERCOCET) 10-325 MG tablet Take 1 tablet by mouth every 6 (six) hours as needed for pain.    OXYGEN Inhale 3 L into the lungs continuous. 07/27/2021: Now using 5lpm when walking   OZEMPIC, 0.25 OR 0.5 MG/DOSE, 2 MG/3ML SOPN Inject into the skin.    potassium chloride SA (KLOR-CON) 20 MEQ tablet Take 2 tablets (40 mEq total) by mouth 2 (two) times daily. (Patient taking differently: Take 40 mEq by mouth daily.)    prochlorperazine (COMPAZINE) 10 MG tablet Take 1 tablet (10 mg total) by mouth every 6 (six) hours as needed for nausea or vomiting.    TRUEplus Lancets 33G MISC Check BS daily Dx E11.9    [DISCONTINUED] lenalidomide (REVLIMID) 15 MG capsule TAKE 1 CAPSULE BY MOUTH EVERY DAY FOR 21 DAYS ON THEN 7 DAYS OFF. REPEAT EVERY 28 DAYS    ferrous sulfate 325 (65 FE) MG tablet Take 1 tablet (325 mg total) by mouth 2 (two) times daily with a meal. (Patient taking differently: Take 325 mg by mouth daily with breakfast.)    torsemide (DEMADEX) 20 MG tablet Take 3 tablets (60 mg total) by mouth 2 (two) times daily. (Patient taking differently: Take 50 mg by mouth 2 (two) times daily.)    [DISCONTINUED] albuterol (PROVENTIL HFA;VENTOLIN HFA) 108 (90 Base) MCG/ACT inhaler Inhale 2 puffs into the lungs every 6 (six) hours as needed for wheezing or shortness of breath.    [DISCONTINUED] lenalidomide (REVLIMID) 15 MG capsule Take 1 capsule (15 mg total) by mouth daily. Take for 21 days, then hold for 7 days.  Repeat every 28 days.    No facility-administered encounter medications on file as of 12/28/2021.    ALLERGIES:  Allergies  Allergen Reactions   Keflex [Cephalexin] Nausea And Vomiting     PHYSICAL EXAM:  ECOG Performance status: 1  There were no vitals filed for this visit. There were no vitals filed for this visit. Physical Exam Vitals reviewed.  Constitutional:      Appearance: Normal appearance.  Cardiovascular:     Rate and Rhythm: Normal rate and regular rhythm.     Heart sounds: Normal heart sounds.  Pulmonary:     Effort: Pulmonary effort is normal.     Breath sounds: Normal breath sounds.  Abdominal:     Palpations: Abdomen is soft.  Neurological:     Mental Status: She is alert.  Psychiatric:        Mood and Affect: Mood normal.        Behavior: Behavior normal.      LABORATORY DATA:  I have reviewed the labs as listed.  CBC    Component Value Date/Time   WBC 1.8 (L) 01/04/2022 0810   RBC 3.60 (L) 01/04/2022 0810   HGB 10.0 (L) 01/04/2022 0810   HGB 9.1 (L) 02/26/2021 1129   HCT 33.1 (L) 01/04/2022 0810   HCT 30.5 (L) 02/26/2021 1129   PLT 117 (L) 01/04/2022 0810   PLT 104 (L) 02/26/2021 1129   MCV 91.9 01/04/2022 0810   MCV 87 02/26/2021 1129   MCH 27.8 01/04/2022 0810   MCHC 30.2 01/04/2022 0810  RDW 16.7 (H) 01/04/2022 0810   RDW 16.4 (H) 02/26/2021 1129   LYMPHSABS 0.5 (L) 01/04/2022 0810   LYMPHSABS 1.8 06/24/2020 1529   MONOABS 0.3 01/04/2022 0810   EOSABS 0.0 01/04/2022 0810   EOSABS 0.1 06/24/2020 1529   BASOSABS 0.0 01/04/2022 0810   BASOSABS 0.0 06/24/2020 1529      Latest Ref Rng & Units 01/04/2022    8:10 AM 12/28/2021   11:26 AM 12/21/2021    8:11 AM  CMP  Glucose 70 - 99 mg/dL 153  137  226   BUN 8 - 23 mg/dL 12  11  15    Creatinine 0.44 - 1.00 mg/dL 0.58  0.51  0.65   Sodium 135 - 145 mmol/L 138  136  137   Potassium 3.5 - 5.1 mmol/L 4.1  3.6  3.9   Chloride 98 - 111 mmol/L 105  103  102   CO2 22 - 32 mmol/L 27  26   29    Calcium 8.9 - 10.3 mg/dL 8.3  8.0  8.0   Total Protein 6.5 - 8.1 g/dL 6.7  6.8  7.3   Total Bilirubin 0.3 - 1.2 mg/dL 0.6  0.7  0.5   Alkaline Phos 38 - 126 U/L 94  79  69   AST 15 - 41 U/L 19  15  18    ALT 0 - 44 U/L 21  16  15      DIAGNOSTIC IMAGING:  I have independently reviewed the scans and discussed with the patient.  ASSESSMENT: 1.  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  Orderly maturation of erythroid and myeloid series. - Myeloma FISH panel: t(11;14) - Cytogenetics: Failed to grow metaphases - PET scan (11/18/2021): No evidence of active myeloma or plasmacytoma.  Right upper lobe lung infection. - Skull x-rays (10/29/2021) multiple discrete round lucent/lytic lesions in the skull - Worsening M spike and free light chain ratio.  Normal creatinine and calcium.  Multifactorial anemia including blood loss and bone marrow infiltration. - Cycle 1 of DRd started on 12/14/2021 with lenalidomide 15 mg 3 weeks on/1 week off and dexamethasone 20 mg weekly   2.  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  Quit smoking 15 years ago.  No exposure to chemicals or pesticides. - Paternal grandmother had ovarian cancer.  Sister had ovarian cancer.  3.  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  Seen by transplant hepatology service at [facility].     PLAN:   1.  Stage II IgG kappa myeloma, standard risk: - Cycle 1 of Darzalex, Revlimid and dexamethasone started on 12/14/2021. - Baseline hemoglobin A1c was 5.7. - Labs today shows normal LFTs and creatinine.  Calcium was 8.0 with almond 2.9.  White count is 2.6 and platelet count 100.  Hemoglobin has improved to 8.9. - Proceed with her treatment today. - She will start cycle 2 in 2 weeks.  Repeat myeloma labs in 2 weeks.  RTC 3 to 4 weeks.   2.  Iron  deficiency anemia from chronic GI bleed: - Received Venofer 300 mg on 12/10/2018 and 12/21/2021. - Continue B12 injections monthly. - Hemoglobin today is 8.9.  No transfusion necessary.   3.  Infection prophylaxis: - Continue acyclovir 400 mg twice daily. - Aspirin on hold due to history of GI bleeds.   4.  Cirrhosis: -  Currently on Lasix 20 mg daily as needed, carvedilol 6.25 mg daily and lactulose 30 mils daily.    Orders placed this encounter:  No orders of the defined types were placed in this encounter.     Derek Jack, MD [facility] [phone]

## 2021-12-28 NOTE — Patient Instructions (Signed)
[facility]  Discharge Instructions: Thank you for choosing [facility] to provide your oncology and hematology care.  If you have a lab appointment with the [facility], please come in thru the Main Entrance and check in at the main information desk.  Wear comfortable clothing and clothing appropriate for easy access to any Portacath or PICC line.   We strive to give you quality time with your provider. You may need to reschedule your appointment if you arrive late (15 or more minutes).  Arriving late affects you and other patients whose appointments are after yours.  Also, if you miss three or more appointments without notifying the office, you may be dismissed from the clinic at the provider's discretion.      For prescription refill requests, have your pharmacy contact our office and allow 72 hours for refills to be completed.    Today you received the following chemotherapy and/or immunotherapy agents Dara SQ   To help prevent nausea and vomiting after your treatment, we encourage you to take your nausea medication as directed.  Daratumumab; Hyaluronidase Injection What is this medication? DARATUMUMAB; HYALURONIDASE (dar a toom ue mab; hye al ur ON i dase) treats multiple myeloma, a type of bone marrow cancer. Daratumumab works by blocking a protein that causes cancer cells to grow and multiply. This helps to slow or stop the spread of cancer cells. Hyaluronidase works by increasing the absorption of other medications in the body to help them work better. This medication may also be used treat amyloidosis, a condition that causes the buildup of a protein (amyloid) in your body. It works by reducing the buildup of this protein, which decreases symptoms. It is a combination medication that contains a monoclonal antibody. This medicine may be used for other purposes; ask your health care provider or pharmacist if you have questions. COMMON BRAND NAME(S):  DARZALEX FASPRO What should I tell my care team before I take this medication? They need to know if you have any of these conditions: Heart disease Infection, such as chickenpox, cold sores, herpes, hepatitis B Lung or breathing disease An unusual or allergic reaction to daratumumab, hyaluronidase, other medications, foods, dyes, or preservatives Pregnant or trying to get pregnant Breast-feeding How should I use this medication? This medication is injected under the skin. It is given by your care team in a hospital or clinic setting. Talk to your care team about the use of this medication in children. Special care may be needed. Overdosage: If you think you have taken too much of this medicine contact a poison control center or emergency room at once. NOTE: This medicine is only for you. Do not share this medicine with others. What if I miss a dose? Keep appointments for follow-up doses. It is important not to miss your dose. Call your care team if you are unable to keep an appointment. What may interact with this medication? Interactions have not been studied. This list may not describe all possible interactions. Give your health care provider a list of all the medicines, herbs, non-prescription drugs, or dietary supplements you use. Also tell them if you smoke, drink alcohol, or use illegal drugs. Some items may interact with your medicine. What should I watch for while using this medication? Your condition will be monitored carefully while you are receiving this medication. This medication can cause serious allergic reactions. To reduce your risk, your care team may give you other medication to take before receiving this  one. Be sure to follow the directions from your care team. This medication can affect the results of blood tests to match your blood type. These changes can last for up to 6 months after the final dose. Your care team will do blood tests to match your blood type before you  start treatment. Tell all of your care team that you are being treated with this medication before receiving a blood transfusion. This medication can affect the results of some tests used to determine treatment response; extra tests may be needed to evaluate response. Talk to your care team if you wish to become pregnant or think you are pregnant. This medication can cause serious birth defects if taken during pregnancy and for 3 months after the last dose. A reliable form of contraception is recommended while taking this medication and for 3 months after the last dose. Talk to your care team about effective forms of contraception. Do not breast-feed while taking this medication. What side effects may I notice from receiving this medication? Side effects that you should report to your care team as soon as possible: Allergic reactions--skin rash, itching, hives, swelling of the face, lips, tongue, or throat Heart rhythm changes--fast or irregular heartbeat, dizziness, feeling faint or lightheaded, chest pain, trouble breathing Infection--fever, chills, cough, sore throat, wounds that don't heal, pain or trouble when passing urine, general feeling of discomfort or being unwell Infusion reactions--chest pain, shortness of breath or trouble breathing, feeling faint or lightheaded Sudden eye pain or change in vision such as blurry vision, seeing halos around lights, vision loss Unusual bruising or bleeding Side effects that usually do not require medical attention (report to your care team if they continue or are bothersome): Constipation Diarrhea Fatigue Nausea Pain, tingling, or numbness in the hands or feet Swelling of the ankles, hands, or feet This list may not describe all possible side effects. Call your doctor for medical advice about side effects. You may report side effects to FDA at 1-800-FDA-1088. Where should I keep my medication? This medication is given in a hospital or clinic. It will  not be stored at home. NOTE: This sheet is a summary. It may not cover all possible information. If you have questions about this medicine, talk to your doctor, pharmacist, or health care provider.  2023 Elsevier/Gold Standard (2021-07-07 00:00:00)   BELOW ARE SYMPTOMS THAT SHOULD BE REPORTED IMMEDIATELY: *FEVER GREATER THAN 100.4 F (38 C) OR HIGHER *CHILLS OR SWEATING *NAUSEA AND VOMITING THAT IS NOT CONTROLLED WITH YOUR NAUSEA MEDICATION *UNUSUAL SHORTNESS OF BREATH *UNUSUAL BRUISING OR BLEEDING *URINARY PROBLEMS (pain or burning when urinating, or frequent urination) *BOWEL PROBLEMS (unusual diarrhea, constipation, pain near the anus) TENDERNESS IN MOUTH AND THROAT WITH OR WITHOUT PRESENCE OF ULCERS (sore throat, sores in mouth, or a toothache) UNUSUAL RASH, SWELLING OR PAIN  UNUSUAL VAGINAL DISCHARGE OR ITCHING   Items with * indicate a potential emergency and should be followed up as soon as possible or go to the Emergency Department if any problems should occur.  Please show the CHEMOTHERAPY ALERT CARD or IMMUNOTHERAPY ALERT CARD at check-in to the Emergency Department and triage nurse.  Should you have questions after your visit or need to cancel or reschedule your appointment, please contact [facility] [phone]  and follow the prompts.  Office hours are 8:00 a.m. to 4:30 p.m. Monday - Friday. Please note that voicemails left after 4:00 p.m. may not be returned until the following business day.  We are  closed weekends and major holidays. You have access to a nurse at all times for urgent questions. Please call the main number to the clinic [phone] and follow the prompts.  For any non-urgent questions, you may also contact your provider using MyChart. We now offer e-Visits for anyone 72 and older to request care online for non-urgent symptoms. For details visit [email or web address].   Also download the MyChart app! Go to the app store, search  "MyChart", open the app, select [facility], and log in with your MyChart username and password.  Masks are optional in the cancer centers. If you would like for your care team to wear a mask while they are taking care of you, please let them know. You may have one support person who is at least 72 years old accompany you for your appointments.

## 2021-12-28 NOTE — Progress Notes (Signed)
Pt presents today for Savannah Burns per provider's order. Vital signs and labs WNL for treatment. Okay to proceed with treatment today per Dr.K.  Pt took pre-meds at home prior to arrival.  [facility] given today per MD orders. Tolerated infusion without adverse affects. Vital signs stable. No complaints at this time. Discharged from clinic ambulatory in stable condition. Alert and oriented x 3. F/U with [facility] as scheduled.

## 2021-12-31 DIAGNOSIS — C9000 Multiple myeloma not having achieved remission: Secondary | ICD-10-CM | POA: Diagnosis not present

## 2021-12-31 DIAGNOSIS — R030 Elevated blood-pressure reading, without diagnosis of hypertension: Secondary | ICD-10-CM | POA: Diagnosis not present

## 2021-12-31 DIAGNOSIS — F1120 Opioid dependence, uncomplicated: Secondary | ICD-10-CM | POA: Diagnosis not present

## 2021-12-31 DIAGNOSIS — G894 Chronic pain syndrome: Secondary | ICD-10-CM | POA: Diagnosis not present

## 2021-12-31 DIAGNOSIS — Z6841 Body Mass Index (BMI) 40.0 and over, adult: Secondary | ICD-10-CM | POA: Diagnosis not present

## 2021-12-31 DIAGNOSIS — M5136 Other intervertebral disc degeneration, lumbar region: Secondary | ICD-10-CM | POA: Diagnosis not present

## 2022-01-04 ENCOUNTER — Inpatient Hospital Stay: Payer: Medicare PPO

## 2022-01-04 ENCOUNTER — Encounter: Payer: Self-pay | Admitting: Hematology

## 2022-01-04 ENCOUNTER — Encounter (HOSPITAL_COMMUNITY): Payer: Self-pay | Admitting: Hematology

## 2022-01-04 VITALS — BP 129/69 | HR 80 | Temp 97.6°F | Resp 18

## 2022-01-04 DIAGNOSIS — C9000 Multiple myeloma not having achieved remission: Secondary | ICD-10-CM | POA: Diagnosis not present

## 2022-01-04 DIAGNOSIS — J189 Pneumonia, unspecified organism: Secondary | ICD-10-CM | POA: Diagnosis not present

## 2022-01-04 DIAGNOSIS — R197 Diarrhea, unspecified: Secondary | ICD-10-CM | POA: Diagnosis not present

## 2022-01-04 DIAGNOSIS — E538 Deficiency of other specified B group vitamins: Secondary | ICD-10-CM | POA: Diagnosis not present

## 2022-01-04 DIAGNOSIS — Z5112 Encounter for antineoplastic immunotherapy: Secondary | ICD-10-CM | POA: Diagnosis not present

## 2022-01-04 DIAGNOSIS — D500 Iron deficiency anemia secondary to blood loss (chronic): Secondary | ICD-10-CM | POA: Diagnosis not present

## 2022-01-04 DIAGNOSIS — K7460 Unspecified cirrhosis of liver: Secondary | ICD-10-CM | POA: Diagnosis not present

## 2022-01-04 DIAGNOSIS — R918 Other nonspecific abnormal finding of lung field: Secondary | ICD-10-CM | POA: Diagnosis not present

## 2022-01-04 DIAGNOSIS — D3502 Benign neoplasm of left adrenal gland: Secondary | ICD-10-CM | POA: Diagnosis not present

## 2022-01-04 LAB — COMPREHENSIVE METABOLIC PANEL
ALT: 21 U/L (ref 0–44)
AST: 19 U/L (ref 15–41)
Albumin: 3.1 g/dL — ABNORMAL LOW (ref 3.5–5.0)
Alkaline Phosphatase: 94 U/L (ref 38–126)
Anion gap: 6 (ref 5–15)
BUN: 12 mg/dL (ref 8–23)
CO2: 27 mmol/L (ref 22–32)
Calcium: 8.3 mg/dL — ABNORMAL LOW (ref 8.9–10.3)
Chloride: 105 mmol/L (ref 98–111)
Creatinine, Ser: 0.58 mg/dL (ref 0.44–1.00)
GFR, Estimated: >60 mL/min (ref >60)
Glucose, Bld: 153 mg/dL — ABNORMAL HIGH (ref 70–99)
Potassium: 4.1 mmol/L (ref 3.5–5.1)
Sodium: 138 mmol/L (ref 135–145)
Total Bilirubin: 0.6 mg/dL (ref 0.3–1.2)
Total Protein: 6.7 g/dL (ref 6.5–8.1)

## 2022-01-04 LAB — CBC WITH DIFFERENTIAL/PLATELET
Abs Immature Granulocytes: 0.01 10*3/uL (ref 0.00–0.07)
Basophils Absolute: 0 10*3/uL (ref 0.0–0.1)
Basophils Relative: 1 %
Eosinophils Absolute: 0 10*3/uL (ref 0.0–0.5)
Eosinophils Relative: 2 %
HCT: 33.1 % — ABNORMAL LOW (ref 36.0–46.0)
Hemoglobin: 10 g/dL — ABNORMAL LOW (ref 12.0–15.0)
Immature Granulocytes: 1 %
Lymphocytes Relative: 31 %
Lymphs Abs: 0.5 10*3/uL — ABNORMAL LOW (ref 0.7–4.0)
MCH: 27.8 pg (ref 26.0–34.0)
MCHC: 30.2 g/dL (ref 30.0–36.0)
MCV: 91.9 fL (ref 80.0–100.0)
Monocytes Absolute: 0.3 10*3/uL (ref 0.1–1.0)
Monocytes Relative: 14 %
Neutro Abs: 0.9 10*3/uL — ABNORMAL LOW (ref 1.7–7.7)
Neutrophils Relative %: 51 %
Platelets: 117 10*3/uL — ABNORMAL LOW (ref 150–400)
RBC: 3.6 MIL/uL — ABNORMAL LOW (ref 3.87–5.11)
RDW: 16.7 % — ABNORMAL HIGH (ref 11.5–15.5)
WBC: 1.8 10*3/uL — ABNORMAL LOW (ref 4.0–10.5)
nRBC: 0 % (ref 0.0–0.2)

## 2022-01-04 LAB — SAMPLE TO BLOOD BANK

## 2022-01-04 LAB — MAGNESIUM: Magnesium: 2 mg/dL (ref 1.7–2.4)

## 2022-01-04 MED ORDER — SODIUM CHLORIDE FLUSH 0.9 % IV SOLN
10.0000 mL | Freq: Once | INTRAVENOUS | Status: AC
  Administered 2022-01-04: 10 mL via INTRAVENOUS
  Filled 2022-01-04: qty 10

## 2022-01-04 MED ORDER — SODIUM CHLORIDE 0.9 % IV SOLN: Freq: Once | INTRAVENOUS | Status: AC

## 2022-01-04 MED ORDER — LORATADINE 10 MG PO TABS: 10.0000 mg | ORAL_TABLET | Freq: Once | ORAL | Status: DC

## 2022-01-04 MED ORDER — DARATUMUMAB-HYALURONIDASE-FIHJ 1800-30000 MG-UT/15ML ~~LOC~~ SOLN
1800.0000 mg | Freq: Once | SUBCUTANEOUS | Status: AC
  Administered 2022-01-04: 1800 mg via SUBCUTANEOUS
  Filled 2022-01-04: qty 15

## 2022-01-04 MED ORDER — SODIUM CHLORIDE 0.9 % IV SOLN
300.0000 mg | Freq: Once | INTRAVENOUS | Status: AC
  Administered 2022-01-04: 300 mg via INTRAVENOUS
  Filled 2022-01-04: qty 300

## 2022-01-04 MED ORDER — CYANOCOBALAMIN 1000 MCG/ML IJ SOLN
1000.0000 ug | Freq: Once | INTRAMUSCULAR | Status: AC
  Administered 2022-01-04: 1000 ug via INTRAMUSCULAR
  Filled 2022-01-04: qty 1

## 2022-01-04 MED ORDER — SODIUM CHLORIDE 0.9% FLUSH
10.0000 mL | Freq: Once | INTRAVENOUS | Status: AC | PRN
  Administered 2022-01-04: 10 mL

## 2022-01-04 MED ORDER — HEPARIN SOD (PORK) LOCK FLUSH 100 UNIT/ML IV SOLN
500.0000 [IU] | Freq: Once | INTRAVENOUS | Status: AC | PRN
  Administered 2022-01-04: 500 [IU]

## 2022-01-04 NOTE — Patient Instructions (Signed)
[place]  Discharge Instructions: Thank you for choosing [facility] to provide your oncology and hematology care.  If you have a lab appointment with the [facility], please come in thru the Main Entrance and check in at the main information desk.  Wear comfortable clothing and clothing appropriate for easy access to any Portacath or PICC line.   We strive to give you quality time with your provider. You may need to reschedule your appointment if you arrive late (15 or more minutes).  Arriving late affects you and other patients whose appointments are after yours.  Also, if you miss three or more appointments without notifying the office, you may be dismissed from the clinic at the provider's discretion.      For prescription refill requests, have your pharmacy contact our office and allow 72 hours for refills to be completed.    Today you received the following chemotherapy and/or immunotherapy agents Darzalex Faspro. Daratumumab Injection What is this medication? DARATUMUMAB (dar a toom ue mab) treats multiple myeloma, a type of bone marrow cancer. It works by helping your immune system slow or stop the spread of cancer cells. It is a monoclonal antibody. This medicine may be used for other purposes; ask your health care provider or pharmacist if you have questions. COMMON BRAND NAME(S): DARZALEX What should I tell my care team before I take this medication? They need to know if you have any of these conditions: Hereditary fructose intolerance Infection, such as chickenpox, herpes, hepatitis B virus Lung or breathing disease, such as asthma, COPD An unusual or allergic reaction to daratumumab, sorbitol, other medications, foods, dyes, or preservatives Pregnant or trying to get pregnant Breast-feeding How should I use this medication? This medication is injected into a vein. It is given by your care team in a hospital or clinic setting. Talk to  your care team about the use of this medication in children. Special care may be needed. Overdosage: If you think you have taken too much of this medicine contact a poison control center or emergency room at once. NOTE: This medicine is only for you. Do not share this medicine with others. What if I miss a dose? Keep appointments for follow-up doses. It is important not to miss your dose. Call your care team if you are unable to keep an appointment. What may interact with this medication? Interactions have not been studied. This list may not describe all possible interactions. Give your health care provider a list of all the medicines, herbs, non-prescription drugs, or dietary supplements you use. Also tell them if you smoke, drink alcohol, or use illegal drugs. Some items may interact with your medicine. What should I watch for while using this medication? Your condition will be monitored carefully while you are receiving this medication. This medication can cause serious allergic reactions. To reduce your risk, your care team may give you other medication to take before receiving this one. Be sure to follow the directions from your care team. This medication can affect the results of blood tests to match your blood type. These changes can last for up to 6 months after the final dose. Your care team will do blood tests to match your blood type before you start treatment. Tell all of your care team that you are being treated with this medication before receiving a blood transfusion. This medication can affect the results of some tests used to determine treatment response; extra tests may be needed  to evaluate response. Talk to your care team if you wish to become pregnant or think you are pregnant. This medication can cause serious birth defects if taken during pregnancy and for 3 months after the last dose. A reliable form of contraception is recommended while taking this medication and for 3 months  after the last dose. Talk to your care team about effective forms of contraception. Do not breast-feed while taking this medication. What side effects may I notice from receiving this medication? Side effects that you should report to your care team as soon as possible: Allergic reactions--skin rash, itching, hives, swelling of the face, lips, tongue, or throat Infection--fever, chills, cough, sore throat, wounds that don't heal, pain or trouble when passing urine, general feeling of discomfort or being unwell Infusion reactions--chest pain, shortness of breath or trouble breathing, feeling faint or lightheaded Unusual bruising or bleeding Side effects that usually do not require medical attention (report to your care team if they continue or are bothersome): Constipation Diarrhea Fatigue Nausea Pain, tingling, or numbness in the hands or feet Swelling of the ankles, hands, or feet This list may not describe all possible side effects. Call your doctor for medical advice about side effects. You may report side effects to FDA at 1-800-FDA-1088. Where should I keep my medication? This medication is given in a hospital or clinic. It will not be stored at home. NOTE: This sheet is a summary. It may not cover all possible information. If you have questions about this medicine, talk to your doctor, pharmacist, or health care provider.  2023 Elsevier/Gold Standard (2014-02-10 00:00:00)       To help prevent nausea and vomiting after your treatment, we encourage you to take your nausea medication as directed.  BELOW ARE SYMPTOMS THAT SHOULD BE REPORTED IMMEDIATELY: *FEVER GREATER THAN 100.4 F (38 C) OR HIGHER *CHILLS OR SWEATING *NAUSEA AND VOMITING THAT IS NOT CONTROLLED WITH YOUR NAUSEA MEDICATION *UNUSUAL SHORTNESS OF BREATH *UNUSUAL BRUISING OR BLEEDING *URINARY PROBLEMS (pain or burning when urinating, or frequent urination) *BOWEL PROBLEMS (unusual diarrhea, constipation, pain near the  anus) TENDERNESS IN MOUTH AND THROAT WITH OR WITHOUT PRESENCE OF ULCERS (sore throat, sores in mouth, or a toothache) UNUSUAL RASH, SWELLING OR PAIN  UNUSUAL VAGINAL DISCHARGE OR ITCHING   Items with * indicate a potential emergency and should be followed up as soon as possible or go to the Emergency Department if any problems should occur.  Please show the CHEMOTHERAPY ALERT CARD or IMMUNOTHERAPY ALERT CARD at check-in to the Emergency Department and triage nurse.  Should you have questions after your visit or need to cancel or reschedule your appointment, please contact [facility] [phone]  and follow the prompts.  Office hours are 8:00 a.m. to 4:30 p.m. Monday - Friday. Please note that voicemails left after 4:00 p.m. may not be returned until the following business day.  We are closed weekends and major holidays. You have access to a nurse at all times for urgent questions. Please call the main number to the clinic [phone] and follow the prompts.  For any non-urgent questions, you may also contact your provider using MyChart. We now offer e-Visits for anyone 67 and older to request care online for non-urgent symptoms. For details visit [email or web address].   Also download the MyChart app! Go to the app store, search "MyChart", open the app, select [facility], and log in with your MyChart username and password.  Masks are optional in the cancer centers.  If you would like for your care team to wear a mask while they are taking care of you, please let them know. You may have one support person who is at least 72 years old accompany you for your appointments.

## 2022-01-04 NOTE — Progress Notes (Signed)
I engaged in a follow-up visit with Savannah Burns.  She was able to discuss her complex family system, her continuous healthcare journey, and her desires for the future which include building a faith community.  Savannah Burns was able to express her frustration and commitment to her treatment schedule, while also thinking deeply about her age and her desires for the future.    I continue to offer listening, community, and presence.      01/04/22 1100  Clinical Encounter Type  Visited With Patient  Visit Type Follow-up     Zakiya Sporrer, MDiv

## 2022-01-04 NOTE — Progress Notes (Signed)
Patient presents today for Darzalex Faspro injection and Venofer 300 mg infusion. Labs pending. Patient denies any changes since her last treatment. Patient took all pre-medication prior to arrival for her treatment and the iron per patient's words. Patient states she is taking Revlimid as prescribed and denies any side effects related to her Revlimid. Vital signs within parameters for treatment. Patient has no complaints today.   ANC 0.9. All other labs within parameters for treatment. Vital signs within parameters for treatment. Message sent to Dr. Delton Coombes by secure chat and ANC 0.9 reported.   Patient to receive Zarxio 480 mcg per A. Beckie Salts / C. Ronnald Ramp [facility] tomorrow. Scheduling notifed. Financial representative notified for authorization.   Darzalex Faspro given today [place] and Venofer 300 mg IV per MD orders. Tolerated infusion without adverse affects. Vital signs stable. No complaints at this time. Patient aware of Discharged from clinic by wheel chair accompanied by granddaughter  in stable condition. Alert and oriented x 3. F/U with [facility] as scheduled.

## 2022-01-05 ENCOUNTER — Inpatient Hospital Stay: Payer: Medicare PPO

## 2022-01-05 VITALS — BP 129/50 | HR 64 | Temp 97.6°F | Resp 20

## 2022-01-05 DIAGNOSIS — R918 Other nonspecific abnormal finding of lung field: Secondary | ICD-10-CM | POA: Diagnosis not present

## 2022-01-05 DIAGNOSIS — R197 Diarrhea, unspecified: Secondary | ICD-10-CM | POA: Diagnosis not present

## 2022-01-05 DIAGNOSIS — E538 Deficiency of other specified B group vitamins: Secondary | ICD-10-CM | POA: Diagnosis not present

## 2022-01-05 DIAGNOSIS — Z5112 Encounter for antineoplastic immunotherapy: Secondary | ICD-10-CM | POA: Diagnosis not present

## 2022-01-05 DIAGNOSIS — D500 Iron deficiency anemia secondary to blood loss (chronic): Secondary | ICD-10-CM

## 2022-01-05 DIAGNOSIS — C9000 Multiple myeloma not having achieved remission: Secondary | ICD-10-CM | POA: Diagnosis not present

## 2022-01-05 DIAGNOSIS — J189 Pneumonia, unspecified organism: Secondary | ICD-10-CM | POA: Diagnosis not present

## 2022-01-05 DIAGNOSIS — K7460 Unspecified cirrhosis of liver: Secondary | ICD-10-CM | POA: Diagnosis not present

## 2022-01-05 DIAGNOSIS — D3502 Benign neoplasm of left adrenal gland: Secondary | ICD-10-CM | POA: Diagnosis not present

## 2022-01-05 MED ORDER — FILGRASTIM-SNDZ 480 MCG/0.8ML IJ SOSY
480.0000 ug | PREFILLED_SYRINGE | Freq: Once | INTRAMUSCULAR | Status: AC
  Administered 2022-01-05: 480 ug via SUBCUTANEOUS
  Filled 2022-01-05 (×2): qty 0.8

## 2022-01-05 NOTE — Progress Notes (Signed)
Patient tolerated Zarxio injection with no complaints voiced. Site clean and dry with no bruising or swelling noted at site. See MAR for details. Band aid applied.  Patient stable during and after injection. VSS with discharge and left in satisfactory condition with no s/s of distress noted.

## 2022-01-05 NOTE — Patient Instructions (Signed)
[place]  Discharge Instructions: Thank you for choosing [facility] to provide your oncology and hematology care.  If you have a lab appointment with the [facility], please come in thru the Main Entrance and check in at the main information desk.  Wear comfortable clothing and clothing appropriate for easy access to any Portacath or PICC line.   We strive to give you quality time with your provider. You may need to reschedule your appointment if you arrive late (15 or more minutes).  Arriving late affects you and other patients whose appointments are after yours.  Also, if you miss three or more appointments without notifying the office, you may be dismissed from the clinic at the provider's discretion.      For prescription refill requests, have your pharmacy contact our office and allow 72 hours for refills to be completed.    Today you received the following Zarxio injection, return as scheduled.   To help prevent nausea and vomiting after your treatment, we encourage you to take your nausea medication as directed.  BELOW ARE SYMPTOMS THAT SHOULD BE REPORTED IMMEDIATELY: *FEVER GREATER THAN 100.4 F (38 C) OR HIGHER *CHILLS OR SWEATING *NAUSEA AND VOMITING THAT IS NOT CONTROLLED WITH YOUR NAUSEA MEDICATION *UNUSUAL SHORTNESS OF BREATH *UNUSUAL BRUISING OR BLEEDING *URINARY PROBLEMS (pain or burning when urinating, or frequent urination) *BOWEL PROBLEMS (unusual diarrhea, constipation, pain near the anus) TENDERNESS IN MOUTH AND THROAT WITH OR WITHOUT PRESENCE OF ULCERS (sore throat, sores in mouth, or a toothache) UNUSUAL RASH, SWELLING OR PAIN  UNUSUAL VAGINAL DISCHARGE OR ITCHING   Items with * indicate a potential emergency and should be followed up as soon as possible or go to the Emergency Department if any problems should occur.  Please show the CHEMOTHERAPY ALERT CARD or IMMUNOTHERAPY ALERT CARD at check-in to the Emergency Department  and triage nurse.  Should you have questions after your visit or need to cancel or reschedule your appointment, please contact [facility] [phone]  and follow the prompts.  Office hours are 8:00 a.m. to 4:30 p.m. Monday - Friday. Please note that voicemails left after 4:00 p.m. may not be returned until the following business day.  We are closed weekends and major holidays. You have access to a nurse at all times for urgent questions. Please call the main number to the clinic [phone] and follow the prompts.  For any non-urgent questions, you may also contact your provider using MyChart. We now offer e-Visits for anyone 72 and older to request care online for non-urgent symptoms. For details visit [email or web address].   Also download the MyChart app! Go to the app store, search "MyChart", open the app, select [place], and log in with your MyChart username and password.  Masks are optional in the cancer centers. If you would like for your care team to wear a mask while they are taking care of you, please let them know. You may have one support person who is at least 72 years old accompany you for your appointments.

## 2022-01-07 ENCOUNTER — Encounter: Payer: Self-pay | Admitting: Hematology

## 2022-01-07 ENCOUNTER — Encounter (HOSPITAL_COMMUNITY): Payer: Self-pay | Admitting: Hematology

## 2022-01-11 ENCOUNTER — Inpatient Hospital Stay: Payer: Medicare PPO

## 2022-01-11 VITALS — BP 122/68 | HR 74 | Temp 98.9°F | Resp 20 | Wt 290.8 lb

## 2022-01-11 DIAGNOSIS — E538 Deficiency of other specified B group vitamins: Secondary | ICD-10-CM | POA: Diagnosis not present

## 2022-01-11 DIAGNOSIS — C9000 Multiple myeloma not having achieved remission: Secondary | ICD-10-CM

## 2022-01-11 DIAGNOSIS — D500 Iron deficiency anemia secondary to blood loss (chronic): Secondary | ICD-10-CM | POA: Diagnosis not present

## 2022-01-11 DIAGNOSIS — K7460 Unspecified cirrhosis of liver: Secondary | ICD-10-CM | POA: Diagnosis not present

## 2022-01-11 DIAGNOSIS — D3502 Benign neoplasm of left adrenal gland: Secondary | ICD-10-CM | POA: Diagnosis not present

## 2022-01-11 DIAGNOSIS — Z5112 Encounter for antineoplastic immunotherapy: Secondary | ICD-10-CM | POA: Diagnosis not present

## 2022-01-11 DIAGNOSIS — J189 Pneumonia, unspecified organism: Secondary | ICD-10-CM | POA: Diagnosis not present

## 2022-01-11 DIAGNOSIS — R918 Other nonspecific abnormal finding of lung field: Secondary | ICD-10-CM | POA: Diagnosis not present

## 2022-01-11 DIAGNOSIS — R197 Diarrhea, unspecified: Secondary | ICD-10-CM | POA: Diagnosis not present

## 2022-01-11 LAB — COMPREHENSIVE METABOLIC PANEL
ALT: 16 U/L (ref 0–44)
AST: 17 U/L (ref 15–41)
Albumin: 3.4 g/dL — ABNORMAL LOW (ref 3.5–5.0)
Alkaline Phosphatase: 124 U/L (ref 38–126)
Anion gap: 5 (ref 5–15)
BUN: 14 mg/dL (ref 8–23)
CO2: 29 mmol/L (ref 22–32)
Calcium: 8.3 mg/dL — ABNORMAL LOW (ref 8.9–10.3)
Chloride: 102 mmol/L (ref 98–111)
Creatinine, Ser: 0.52 mg/dL (ref 0.44–1.00)
GFR, Estimated: >60 mL/min (ref >60)
Glucose, Bld: 110 mg/dL — ABNORMAL HIGH (ref 70–99)
Potassium: 3.8 mmol/L (ref 3.5–5.1)
Sodium: 136 mmol/L (ref 135–145)
Total Bilirubin: 0.6 mg/dL (ref 0.3–1.2)
Total Protein: 6.8 g/dL (ref 6.5–8.1)

## 2022-01-11 LAB — MAGNESIUM: Magnesium: 1.9 mg/dL (ref 1.7–2.4)

## 2022-01-11 LAB — CBC WITH DIFFERENTIAL/PLATELET
Abs Immature Granulocytes: 0.02 10*3/uL (ref 0.00–0.07)
Basophils Absolute: 0 10*3/uL (ref 0.0–0.1)
Basophils Relative: 1 %
Eosinophils Absolute: 0 10*3/uL (ref 0.0–0.5)
Eosinophils Relative: 1 %
HCT: 34.8 % — ABNORMAL LOW (ref 36.0–46.0)
Hemoglobin: 10.7 g/dL — ABNORMAL LOW (ref 12.0–15.0)
Immature Granulocytes: 1 %
Lymphocytes Relative: 25 %
Lymphs Abs: 1 10*3/uL (ref 0.7–4.0)
MCH: 28 pg (ref 26.0–34.0)
MCHC: 30.7 g/dL (ref 30.0–36.0)
MCV: 91.1 fL (ref 80.0–100.0)
Monocytes Absolute: 0.6 10*3/uL (ref 0.1–1.0)
Monocytes Relative: 14 %
Neutro Abs: 2.4 10*3/uL (ref 1.7–7.7)
Neutrophils Relative %: 58 %
Platelets: 168 10*3/uL (ref 150–400)
RBC: 3.82 MIL/uL — ABNORMAL LOW (ref 3.87–5.11)
RDW: 17.6 % — ABNORMAL HIGH (ref 11.5–15.5)
WBC: 4.1 10*3/uL (ref 4.0–10.5)
nRBC: 0 % (ref 0.0–0.2)

## 2022-01-11 MED ORDER — HEPARIN SOD (PORK) LOCK FLUSH 100 UNIT/ML IV SOLN
500.0000 [IU] | Freq: Once | INTRAVENOUS | Status: AC
  Administered 2022-01-11: 500 [IU] via INTRAVENOUS

## 2022-01-11 MED ORDER — SODIUM CHLORIDE 0.9% FLUSH
10.0000 mL | INTRAVENOUS | Status: DC | PRN
  Administered 2022-01-11: 10 mL via INTRAVENOUS

## 2022-01-11 MED ORDER — DARATUMUMAB-HYALURONIDASE-FIHJ 1800-30000 MG-UT/15ML ~~LOC~~ SOLN
1800.0000 mg | Freq: Once | SUBCUTANEOUS | Status: AC
  Administered 2022-01-11: 1800 mg via SUBCUTANEOUS
  Filled 2022-01-11: qty 15

## 2022-01-11 NOTE — Progress Notes (Signed)
Pt presents today for Savannah Burns per provider's order. Vital signs and labs WNL for treatment. Okay to proceed with treatment today.  Pt took pre-meds at home prior to arrival.  [facility] given today per MD orders. Tolerated infusion without adverse affects. Vital signs stable. No complaints at this time. Discharged from clinic via wheelchair in stable condition. Alert and oriented x 3. F/U with [facility] as scheduled.

## 2022-01-11 NOTE — Patient Instructions (Signed)
[name]  Discharge Instructions: Thank you for choosing [facility] to provide your oncology and hematology care.  If you have a lab appointment with the [facility], please come in thru the Main Entrance and check in at the main information desk.  Wear comfortable clothing and clothing appropriate for easy access to any Portacath or PICC line.   We strive to give you quality time with your provider. You may need to reschedule your appointment if you arrive late (15 or more minutes).  Arriving late affects you and other patients whose appointments are after yours.  Also, if you miss three or more appointments without notifying the office, you may be dismissed from the clinic at the provider's discretion.      For prescription refill requests, have your pharmacy contact our office and allow 72 hours for refills to be completed.    Today you received the following chemotherapy and/or immunotherapy agents Savannah Burns   To help prevent nausea and vomiting after your treatment, we encourage you to take your nausea medication as directed.  Daratumumab Injection What is this medication? DARATUMUMAB (dar a toom ue mab) treats multiple myeloma, a type of bone marrow cancer. It works by helping your immune system slow or stop the spread of cancer cells. It is a monoclonal antibody. This medicine may be used for other purposes; ask your health care provider or pharmacist if you have questions. COMMON BRAND NAME(S): DARZALEX What should I tell my care team before I take this medication? They need to know if you have any of these conditions: Hereditary fructose intolerance Infection, such as chickenpox, herpes, hepatitis B virus Lung or breathing disease, such as asthma, COPD An unusual or allergic reaction to daratumumab, sorbitol, other medications, foods, dyes, or preservatives Pregnant or trying to get pregnant Breast-feeding How should I use this  medication? This medication is injected into a vein. It is given by your care team in a hospital or clinic setting. Talk to your care team about the use of this medication in children. Special care may be needed. Overdosage: If you think you have taken too much of this medicine contact a poison control center or emergency room at once. NOTE: This medicine is only for you. Do not share this medicine with others. What if I miss a dose? Keep appointments for follow-up doses. It is important not to miss your dose. Call your care team if you are unable to keep an appointment. What may interact with this medication? Interactions have not been studied. This list may not describe all possible interactions. Give your health care provider a list of all the medicines, herbs, non-prescription drugs, or dietary supplements you use. Also tell them if you smoke, drink alcohol, or use illegal drugs. Some items may interact with your medicine. What should I watch for while using this medication? Your condition will be monitored carefully while you are receiving this medication. This medication can cause serious allergic reactions. To reduce your risk, your care team may give you other medication to take before receiving this one. Be sure to follow the directions from your care team. This medication can affect the results of blood tests to match your blood type. These changes can last for up to 6 months after the final dose. Your care team will do blood tests to match your blood type before you start treatment. Tell all of your care team that you are being treated with this medication before receiving  a blood transfusion. This medication can affect the results of some tests used to determine treatment response; extra tests may be needed to evaluate response. Talk to your care team if you wish to become pregnant or think you are pregnant. This medication can cause serious birth defects if taken during pregnancy and for  3 months after the last dose. A reliable form of contraception is recommended while taking this medication and for 3 months after the last dose. Talk to your care team about effective forms of contraception. Do not breast-feed while taking this medication. What side effects may I notice from receiving this medication? Side effects that you should report to your care team as soon as possible: Allergic reactions--skin rash, itching, hives, swelling of the face, lips, tongue, or throat Infection--fever, chills, cough, sore throat, wounds that don't heal, pain or trouble when passing urine, general feeling of discomfort or being unwell Infusion reactions--chest pain, shortness of breath or trouble breathing, feeling faint or lightheaded Unusual bruising or bleeding Side effects that usually do not require medical attention (report to your care team if they continue or are bothersome): Constipation Diarrhea Fatigue Nausea Pain, tingling, or numbness in the hands or feet Swelling of the ankles, hands, or feet This list may not describe all possible side effects. Call your doctor for medical advice about side effects. You may report side effects to FDA at 1-800-FDA-1088. Where should I keep my medication? This medication is given in a hospital or clinic. It will not be stored at home. NOTE: This sheet is a summary. It may not cover all possible information. If you have questions about this medicine, talk to your doctor, pharmacist, or health care provider.  2023 Elsevier/Gold Standard (2014-02-10 00:00:00)   BELOW ARE SYMPTOMS THAT SHOULD BE REPORTED IMMEDIATELY: *FEVER GREATER THAN 100.4 F (38 C) OR HIGHER *CHILLS OR SWEATING *NAUSEA AND VOMITING THAT IS NOT CONTROLLED WITH YOUR NAUSEA MEDICATION *UNUSUAL SHORTNESS OF BREATH *UNUSUAL BRUISING OR BLEEDING *URINARY PROBLEMS (pain or burning when urinating, or frequent urination) *BOWEL PROBLEMS (unusual diarrhea, constipation, pain near the  anus) TENDERNESS IN MOUTH AND THROAT WITH OR WITHOUT PRESENCE OF ULCERS (sore throat, sores in mouth, or a toothache) UNUSUAL RASH, SWELLING OR PAIN  UNUSUAL VAGINAL DISCHARGE OR ITCHING   Items with * indicate a potential emergency and should be followed up as soon as possible or go to the Emergency Department if any problems should occur.  Please show the CHEMOTHERAPY ALERT CARD or IMMUNOTHERAPY ALERT CARD at check-in to the Emergency Department and triage nurse.  Should you have questions after your visit or need to cancel or reschedule your appointment, please contact [facility] [phone]  and follow the prompts.  Office hours are 8:00 a.m. to 4:30 p.m. Monday - Friday. Please note that voicemails left after 4:00 p.m. may not be returned until the following business day.  We are closed weekends and major holidays. You have access to a nurse at all times for urgent questions. Please call the main number to the clinic [phone] and follow the prompts.  For any non-urgent questions, you may also contact your provider using MyChart. We now offer e-Visits for anyone 39 and older to request care online for non-urgent symptoms. For details visit [email or web address].   Also download the MyChart app! Go to the app store, search "MyChart", open the app, select [place], and log in with your MyChart username and password.  Masks are optional in the cancer centers. If you  would like for your care team to wear a mask while they are taking care of you, please let them know. You may have one support person who is at least 72 years old accompany you for your appointments.

## 2022-01-17 LAB — PRETREATMENT RBC PHENOTYPE

## 2022-01-18 ENCOUNTER — Inpatient Hospital Stay: Payer: Medicare PPO

## 2022-01-18 VITALS — BP 123/64 | HR 78 | Temp 99.0°F | Resp 18

## 2022-01-18 DIAGNOSIS — J189 Pneumonia, unspecified organism: Secondary | ICD-10-CM | POA: Diagnosis not present

## 2022-01-18 DIAGNOSIS — C9000 Multiple myeloma not having achieved remission: Secondary | ICD-10-CM

## 2022-01-18 DIAGNOSIS — D500 Iron deficiency anemia secondary to blood loss (chronic): Secondary | ICD-10-CM | POA: Diagnosis not present

## 2022-01-18 DIAGNOSIS — R197 Diarrhea, unspecified: Secondary | ICD-10-CM | POA: Diagnosis not present

## 2022-01-18 DIAGNOSIS — R918 Other nonspecific abnormal finding of lung field: Secondary | ICD-10-CM | POA: Diagnosis not present

## 2022-01-18 DIAGNOSIS — D3502 Benign neoplasm of left adrenal gland: Secondary | ICD-10-CM | POA: Diagnosis not present

## 2022-01-18 DIAGNOSIS — E538 Deficiency of other specified B group vitamins: Secondary | ICD-10-CM | POA: Diagnosis not present

## 2022-01-18 DIAGNOSIS — Z5112 Encounter for antineoplastic immunotherapy: Secondary | ICD-10-CM | POA: Diagnosis not present

## 2022-01-18 DIAGNOSIS — K7460 Unspecified cirrhosis of liver: Secondary | ICD-10-CM | POA: Diagnosis not present

## 2022-01-18 LAB — CBC WITH DIFFERENTIAL/PLATELET
Abs Immature Granulocytes: 0.01 10*3/uL (ref 0.00–0.07)
Basophils Absolute: 0 10*3/uL (ref 0.0–0.1)
Basophils Relative: 0 %
Eosinophils Absolute: 0.1 10*3/uL (ref 0.0–0.5)
Eosinophils Relative: 2 %
HCT: 35.8 % — ABNORMAL LOW (ref 36.0–46.0)
Hemoglobin: 11 g/dL — ABNORMAL LOW (ref 12.0–15.0)
Immature Granulocytes: 0 %
Lymphocytes Relative: 16 %
Lymphs Abs: 0.9 10*3/uL (ref 0.7–4.0)
MCH: 27.8 pg (ref 26.0–34.0)
MCHC: 30.7 g/dL (ref 30.0–36.0)
MCV: 90.4 fL (ref 80.0–100.0)
Monocytes Absolute: 0.4 10*3/uL (ref 0.1–1.0)
Monocytes Relative: 7 %
Neutro Abs: 4.2 10*3/uL (ref 1.7–7.7)
Neutrophils Relative %: 75 %
Platelets: 150 10*3/uL (ref 150–400)
RBC: 3.96 MIL/uL (ref 3.87–5.11)
RDW: 18 % — ABNORMAL HIGH (ref 11.5–15.5)
WBC: 5.6 10*3/uL (ref 4.0–10.5)
nRBC: 0 % (ref 0.0–0.2)

## 2022-01-18 LAB — COMPREHENSIVE METABOLIC PANEL
ALT: 14 U/L (ref 0–44)
AST: 16 U/L (ref 15–41)
Albumin: 3.1 g/dL — ABNORMAL LOW (ref 3.5–5.0)
Alkaline Phosphatase: 103 U/L (ref 38–126)
Anion gap: 5 (ref 5–15)
BUN: 13 mg/dL (ref 8–23)
CO2: 30 mmol/L (ref 22–32)
Calcium: 8.4 mg/dL — ABNORMAL LOW (ref 8.9–10.3)
Chloride: 103 mmol/L (ref 98–111)
Creatinine, Ser: 0.6 mg/dL (ref 0.44–1.00)
GFR, Estimated: >60 mL/min (ref >60)
Glucose, Bld: 164 mg/dL — ABNORMAL HIGH (ref 70–99)
Potassium: 3.7 mmol/L (ref 3.5–5.1)
Sodium: 138 mmol/L (ref 135–145)
Total Bilirubin: 0.5 mg/dL (ref 0.3–1.2)
Total Protein: 6.2 g/dL — ABNORMAL LOW (ref 6.5–8.1)

## 2022-01-18 LAB — MAGNESIUM: Magnesium: 2 mg/dL (ref 1.7–2.4)

## 2022-01-18 MED ORDER — DARATUMUMAB-HYALURONIDASE-FIHJ 1800-30000 MG-UT/15ML ~~LOC~~ SOLN
1800.0000 mg | Freq: Once | SUBCUTANEOUS | Status: AC
  Administered 2022-01-18: 1800 mg via SUBCUTANEOUS
  Filled 2022-01-18: qty 15

## 2022-01-18 MED ORDER — HEPARIN SOD (PORK) LOCK FLUSH 100 UNIT/ML IV SOLN
500.0000 [IU] | Freq: Once | INTRAVENOUS | Status: AC
  Administered 2022-01-18: 500 [IU] via INTRAVENOUS

## 2022-01-18 MED ORDER — SODIUM CHLORIDE 0.9% FLUSH
10.0000 mL | INTRAVENOUS | Status: AC
  Administered 2022-01-18: 10 mL

## 2022-01-18 NOTE — Patient Instructions (Signed)
[facility]  Discharge Instructions: Thank you for choosing [facility] to provide your oncology and hematology care.  If you have a lab appointment with the [facility], please come in thru the Main Entrance and check in at the main information desk.  Wear comfortable clothing and clothing appropriate for easy access to any Portacath or PICC line.   We strive to give you quality time with your provider. You may need to reschedule your appointment if you arrive late (15 or more minutes).  Arriving late affects you and other patients whose appointments are after yours.  Also, if you miss three or more appointments without notifying the office, you may be dismissed from the clinic at the provider's discretion.      For prescription refill requests, have your pharmacy contact our office and allow 72 hours for refills to be completed.    Today you received the following chemotherapy and/or immunotherapy agents Darzalex Faspro injection. Daratumumab; Hyaluronidase Injection What is this medication? DARATUMUMAB; HYALURONIDASE (dar a toom ue mab; hye al ur ON i dase) treats multiple myeloma, a type of bone marrow cancer. Daratumumab works by blocking a protein that causes cancer cells to grow and multiply. This helps to slow or stop the spread of cancer cells. Hyaluronidase works by increasing the absorption of other medications in the body to help them work better. This medication may also be used treat amyloidosis, a condition that causes the buildup of a protein (amyloid) in your body. It works by reducing the buildup of this protein, which decreases symptoms. It is a combination medication that contains a monoclonal antibody. This medicine may be used for other purposes; ask your health care provider or pharmacist if you have questions. COMMON BRAND NAME(S): DARZALEX FASPRO What should I tell my care team before I take this medication? They need to know if you have  any of these conditions: Heart disease Infection, such as chickenpox, cold sores, herpes, hepatitis B Lung or breathing disease An unusual or allergic reaction to daratumumab, hyaluronidase, other medications, foods, dyes, or preservatives Pregnant or trying to get pregnant Breast-feeding How should I use this medication? This medication is injected under the skin. It is given by your care team in a hospital or clinic setting. Talk to your care team about the use of this medication in children. Special care may be needed. Overdosage: If you think you have taken too much of this medicine contact a poison control center or emergency room at once. NOTE: This medicine is only for you. Do not share this medicine with others. What if I miss a dose? Keep appointments for follow-up doses. It is important not to miss your dose. Call your care team if you are unable to keep an appointment. What may interact with this medication? Interactions have not been studied. This list may not describe all possible interactions. Give your health care provider a list of all the medicines, herbs, non-prescription drugs, or dietary supplements you use. Also tell them if you smoke, drink alcohol, or use illegal drugs. Some items may interact with your medicine. What should I watch for while using this medication? Your condition will be monitored carefully while you are receiving this medication. This medication can cause serious allergic reactions. To reduce your risk, your care team may give you other medication to take before receiving this one. Be sure to follow the directions from your care team. This medication can affect the results of blood tests to  match your blood type. These changes can last for up to 6 months after the final dose. Your care team will do blood tests to match your blood type before you start treatment. Tell all of your care team that you are being treated with this medication before receiving a  blood transfusion. This medication can affect the results of some tests used to determine treatment response; extra tests may be needed to evaluate response. Talk to your care team if you wish to become pregnant or think you are pregnant. This medication can cause serious birth defects if taken during pregnancy and for 3 months after the last dose. A reliable form of contraception is recommended while taking this medication and for 3 months after the last dose. Talk to your care team about effective forms of contraception. Do not breast-feed while taking this medication. What side effects may I notice from receiving this medication? Side effects that you should report to your care team as soon as possible: Allergic reactions--skin rash, itching, hives, swelling of the face, lips, tongue, or throat Heart rhythm changes--fast or irregular heartbeat, dizziness, feeling faint or lightheaded, chest pain, trouble breathing Infection--fever, chills, cough, sore throat, wounds that don't heal, pain or trouble when passing urine, general feeling of discomfort or being unwell Infusion reactions--chest pain, shortness of breath or trouble breathing, feeling faint or lightheaded Sudden eye pain or change in vision such as blurry vision, seeing halos around lights, vision loss Unusual bruising or bleeding Side effects that usually do not require medical attention (report to your care team if they continue or are bothersome): Constipation Diarrhea Fatigue Nausea Pain, tingling, or numbness in the hands or feet Swelling of the ankles, hands, or feet This list may not describe all possible side effects. Call your doctor for medical advice about side effects. You may report side effects to FDA at 1-800-FDA-1088. Where should I keep my medication? This medication is given in a hospital or clinic. It will not be stored at home. NOTE: This sheet is a summary. It may not cover all possible information. If you have  questions about this medicine, talk to your doctor, pharmacist, or health care provider.  2023 Elsevier/Gold Standard (2021-07-07 00:00:00)       To help prevent nausea and vomiting after your treatment, we encourage you to take your nausea medication as directed.  BELOW ARE SYMPTOMS THAT SHOULD BE REPORTED IMMEDIATELY: *FEVER GREATER THAN 100.4 F (38 C) OR HIGHER *CHILLS OR SWEATING *NAUSEA AND VOMITING THAT IS NOT CONTROLLED WITH YOUR NAUSEA MEDICATION *UNUSUAL SHORTNESS OF BREATH *UNUSUAL BRUISING OR BLEEDING *URINARY PROBLEMS (pain or burning when urinating, or frequent urination) *BOWEL PROBLEMS (unusual diarrhea, constipation, pain near the anus) TENDERNESS IN MOUTH AND THROAT WITH OR WITHOUT PRESENCE OF ULCERS (sore throat, sores in mouth, or a toothache) UNUSUAL RASH, SWELLING OR PAIN  UNUSUAL VAGINAL DISCHARGE OR ITCHING   Items with * indicate a potential emergency and should be followed up as soon as possible or go to the Emergency Department if any problems should occur.  Please show the CHEMOTHERAPY ALERT CARD or IMMUNOTHERAPY ALERT CARD at check-in to the Emergency Department and triage nurse.  Should you have questions after your visit or need to cancel or reschedule your appointment, please contact [facility] [phone]  and follow the prompts.  Office hours are 8:00 a.m. to 4:30 p.m. Monday - Friday. Please note that voicemails left after 4:00 p.m. may not be returned until the following business day.  We are closed weekends and major holidays. You have access to a nurse at all times for urgent questions. Please call the main number to the clinic [phone] and follow the prompts.  For any non-urgent questions, you may also contact your provider using MyChart. We now offer e-Visits for anyone 61 and older to request care online for non-urgent symptoms. For details visit [email or web address].   Also download the MyChart app! Go to the app  store, search "MyChart", open the app, select [facility], and log in with your MyChart username and password.  Masks are optional in the cancer centers. If you would like for your care team to wear a mask while they are taking care of you, please let them know. You may have one support person who is at least 73 years old accompany you for your appointments.

## 2022-01-18 NOTE — Progress Notes (Signed)
Patient presents today for Darzalex Faspro injection. Vital signs within parameters for treatment. CBC/diff within parameters for treatment.   Treatment given today per MD orders. Tolerated infusion without adverse affects. Vital signs stable. No complaints at this time. Discharged from clinic by wheel chair in stable condition. Alert and oriented x 3. F/U with [facility] as scheduled.

## 2022-01-21 ENCOUNTER — Other Ambulatory Visit: Payer: Self-pay | Admitting: Hematology

## 2022-01-21 DIAGNOSIS — C9000 Multiple myeloma not having achieved remission: Secondary | ICD-10-CM

## 2022-01-24 DIAGNOSIS — L218 Other seborrheic dermatitis: Secondary | ICD-10-CM | POA: Diagnosis not present

## 2022-01-25 ENCOUNTER — Inpatient Hospital Stay: Payer: Medicare PPO

## 2022-01-25 ENCOUNTER — Inpatient Hospital Stay: Payer: Medicare PPO | Admitting: Hematology

## 2022-01-25 DIAGNOSIS — Z95828 Presence of other vascular implants and grafts: Secondary | ICD-10-CM

## 2022-01-25 DIAGNOSIS — C9000 Multiple myeloma not having achieved remission: Secondary | ICD-10-CM

## 2022-01-25 DIAGNOSIS — K7460 Unspecified cirrhosis of liver: Secondary | ICD-10-CM | POA: Diagnosis not present

## 2022-01-25 DIAGNOSIS — D3502 Benign neoplasm of left adrenal gland: Secondary | ICD-10-CM | POA: Diagnosis not present

## 2022-01-25 DIAGNOSIS — J189 Pneumonia, unspecified organism: Secondary | ICD-10-CM | POA: Diagnosis not present

## 2022-01-25 DIAGNOSIS — R197 Diarrhea, unspecified: Secondary | ICD-10-CM | POA: Diagnosis not present

## 2022-01-25 DIAGNOSIS — Z5112 Encounter for antineoplastic immunotherapy: Secondary | ICD-10-CM | POA: Diagnosis not present

## 2022-01-25 DIAGNOSIS — R918 Other nonspecific abnormal finding of lung field: Secondary | ICD-10-CM | POA: Diagnosis not present

## 2022-01-25 DIAGNOSIS — E538 Deficiency of other specified B group vitamins: Secondary | ICD-10-CM | POA: Diagnosis not present

## 2022-01-25 DIAGNOSIS — D500 Iron deficiency anemia secondary to blood loss (chronic): Secondary | ICD-10-CM | POA: Diagnosis not present

## 2022-01-25 LAB — COMPREHENSIVE METABOLIC PANEL
ALT: 18 U/L (ref 0–44)
AST: 21 U/L (ref 15–41)
Albumin: 3.1 g/dL — ABNORMAL LOW (ref 3.5–5.0)
Alkaline Phosphatase: 88 U/L (ref 38–126)
Anion gap: 5 (ref 5–15)
BUN: 13 mg/dL (ref 8–23)
CO2: 27 mmol/L (ref 22–32)
Calcium: 8.2 mg/dL — ABNORMAL LOW (ref 8.9–10.3)
Chloride: 104 mmol/L (ref 98–111)
Creatinine, Ser: 0.65 mg/dL (ref 0.44–1.00)
GFR, Estimated: >60 mL/min (ref >60)
Glucose, Bld: 171 mg/dL — ABNORMAL HIGH (ref 70–99)
Potassium: 3.8 mmol/L (ref 3.5–5.1)
Sodium: 136 mmol/L (ref 135–145)
Total Bilirubin: 0.7 mg/dL (ref 0.3–1.2)
Total Protein: 6.1 g/dL — ABNORMAL LOW (ref 6.5–8.1)

## 2022-01-25 LAB — CBC WITH DIFFERENTIAL/PLATELET
Abs Immature Granulocytes: 0.01 10*3/uL (ref 0.00–0.07)
Basophils Absolute: 0 10*3/uL (ref 0.0–0.1)
Basophils Relative: 0 %
Eosinophils Absolute: 0.1 10*3/uL (ref 0.0–0.5)
Eosinophils Relative: 2 %
HCT: 35 % — ABNORMAL LOW (ref 36.0–46.0)
Hemoglobin: 10.7 g/dL — ABNORMAL LOW (ref 12.0–15.0)
Immature Granulocytes: 0 %
Lymphocytes Relative: 27 %
Lymphs Abs: 0.9 10*3/uL (ref 0.7–4.0)
MCH: 27.6 pg (ref 26.0–34.0)
MCHC: 30.6 g/dL (ref 30.0–36.0)
MCV: 90.2 fL (ref 80.0–100.0)
Monocytes Absolute: 0.3 10*3/uL (ref 0.1–1.0)
Monocytes Relative: 9 %
Neutro Abs: 2 10*3/uL (ref 1.7–7.7)
Neutrophils Relative %: 62 %
Platelets: 130 10*3/uL — ABNORMAL LOW (ref 150–400)
RBC: 3.88 MIL/uL (ref 3.87–5.11)
RDW: 18.7 % — ABNORMAL HIGH (ref 11.5–15.5)
WBC: 3.2 10*3/uL — ABNORMAL LOW (ref 4.0–10.5)
nRBC: 0 % (ref 0.0–0.2)

## 2022-01-25 LAB — MAGNESIUM: Magnesium: 1.9 mg/dL (ref 1.7–2.4)

## 2022-01-25 MED ORDER — SODIUM CHLORIDE 0.9% FLUSH
10.0000 mL | Freq: Once | INTRAVENOUS | Status: AC
  Administered 2022-01-25: 10 mL via INTRAVENOUS

## 2022-01-25 MED ORDER — DARATUMUMAB-HYALURONIDASE-FIHJ 1800-30000 MG-UT/15ML ~~LOC~~ SOLN
1800.0000 mg | Freq: Once | SUBCUTANEOUS | Status: AC
  Administered 2022-01-25: 1800 mg via SUBCUTANEOUS
  Filled 2022-01-25: qty 15

## 2022-01-25 MED ORDER — HEPARIN SOD (PORK) LOCK FLUSH 100 UNIT/ML IV SOLN
500.0000 [IU] | Freq: Once | INTRAVENOUS | Status: AC
  Administered 2022-01-25: 500 [IU] via INTRAVENOUS

## 2022-01-25 MED ORDER — SODIUM CHLORIDE 0.9% FLUSH
10.0000 mL | INTRAVENOUS | Status: DC | PRN
  Administered 2022-01-25: 10 mL via INTRAVENOUS

## 2022-01-25 NOTE — Patient Instructions (Signed)
[facility]  Discharge Instructions: Thank you for choosing [facility] to provide your oncology and hematology care.  If you have a lab appointment with the [facility], please come in thru the Main Entrance and check in at the main information desk.  Wear comfortable clothing and clothing appropriate for easy access to any Portacath or PICC line.   We strive to give you quality time with your provider. You may need to reschedule your appointment if you arrive late (15 or more minutes).  Arriving late affects you and other patients whose appointments are after yours.  Also, if you miss three or more appointments without notifying the office, you may be dismissed from the clinic at the provider's discretion.      For prescription refill requests, have your pharmacy contact our office and allow 72 hours for refills to be completed.    Today you received the following chemotherapy and/or immunotherapy agents Daratumumab, return as scheduled.   To help prevent nausea and vomiting after your treatment, we encourage you to take your nausea medication as directed.  BELOW ARE SYMPTOMS THAT SHOULD BE REPORTED IMMEDIATELY: *FEVER GREATER THAN 100.4 F (38 C) OR HIGHER *CHILLS OR SWEATING *NAUSEA AND VOMITING THAT IS NOT CONTROLLED WITH YOUR NAUSEA MEDICATION *UNUSUAL SHORTNESS OF BREATH *UNUSUAL BRUISING OR BLEEDING *URINARY PROBLEMS (pain or burning when urinating, or frequent urination) *BOWEL PROBLEMS (unusual diarrhea, constipation, pain near the anus) TENDERNESS IN MOUTH AND THROAT WITH OR WITHOUT PRESENCE OF ULCERS (sore throat, sores in mouth, or a toothache) UNUSUAL RASH, SWELLING OR PAIN  UNUSUAL VAGINAL DISCHARGE OR ITCHING   Items with * indicate a potential emergency and should be followed up as soon as possible or go to the Emergency Department if any problems should occur.  Please show the CHEMOTHERAPY ALERT CARD or IMMUNOTHERAPY ALERT CARD at  check-in to the Emergency Department and triage nurse.  Should you have questions after your visit or need to cancel or reschedule your appointment, please contact [facility] [phone]  and follow the prompts.  Office hours are 8:00 a.m. to 4:30 p.m. Monday - Friday. Please note that voicemails left after 4:00 p.m. may not be returned until the following business day.  We are closed weekends and major holidays. You have access to a nurse at all times for urgent questions. Please call the main number to the clinic [phone] and follow the prompts.  For any non-urgent questions, you may also contact your provider using MyChart. We now offer e-Visits for anyone 67 and older to request care online for non-urgent symptoms. For details visit [email or web address].   Also download the MyChart app! Go to the app store, search "MyChart", open the app, select [facility], and log in with your MyChart username and password.  Masks are optional in the cancer centers. If you would like for your care team to wear a mask while they are taking care of you, please let them know. You may have one support person who is at least 72 years old accompany you for your appointments.

## 2022-01-25 NOTE — Progress Notes (Signed)
RN reports patient took premedications at home prior to clinic arrival.  Savannah Burns, PharmD

## 2022-01-25 NOTE — Progress Notes (Signed)
Patients port flushed without difficulty.  Good blood return noted with no bruising or swelling noted at site. Patient remains accessed.

## 2022-01-25 NOTE — Progress Notes (Signed)
Patient reports for Daratumumab, patient reports taking pre-medications at home.  Patient tolerated Daratumumab injection with no complaints voiced. See MAR for details. Lab reviewed. Injection site clean and dry with no bruising or swelling noted at site. Band aid applied. Vss with discharge and left in satisfactory condition with nos/s of distress noted.

## 2022-01-26 LAB — KAPPA/LAMBDA LIGHT CHAINS
Kappa free light chain: 27.4 mg/L — ABNORMAL HIGH (ref 3.3–19.4)
Kappa, lambda light chain ratio: 4.03 — ABNORMAL HIGH (ref 0.26–1.65)
Lambda free light chains: 6.8 mg/L (ref 5.7–26.3)

## 2022-01-27 LAB — PROTEIN ELECTROPHORESIS, SERUM
A/G Ratio: 1.2 (ref 0.7–1.7)
Albumin ELP: 3.2 g/dL (ref 2.9–4.4)
Alpha-1-Globulin: 0.3 g/dL (ref 0.0–0.4)
Alpha-2-Globulin: 0.7 g/dL (ref 0.4–1.0)
Beta Globulin: 0.8 g/dL (ref 0.7–1.3)
Gamma Globulin: 1 g/dL (ref 0.4–1.8)
Globulin, Total: 2.7 g/dL (ref 2.2–3.9)
M-Spike, %: 0.4 g/dL — ABNORMAL HIGH
Total Protein ELP: 5.9 g/dL — ABNORMAL LOW (ref 6.0–8.5)

## 2022-01-28 ENCOUNTER — Other Ambulatory Visit: Payer: Self-pay

## 2022-01-28 DIAGNOSIS — C9000 Multiple myeloma not having achieved remission: Secondary | ICD-10-CM

## 2022-01-28 MED ORDER — LENALIDOMIDE 15 MG PO CAPS: ORAL_CAPSULE | ORAL | 0 refills | Status: DC

## 2022-01-28 NOTE — Telephone Encounter (Signed)
Chart reviewed. Revlimid refilled per verbal order from Dr. Delton Coombes.

## 2022-01-31 DIAGNOSIS — Z6841 Body Mass Index (BMI) 40.0 and over, adult: Secondary | ICD-10-CM | POA: Diagnosis not present

## 2022-01-31 DIAGNOSIS — E119 Type 2 diabetes mellitus without complications: Secondary | ICD-10-CM | POA: Diagnosis not present

## 2022-01-31 DIAGNOSIS — C9000 Multiple myeloma not having achieved remission: Secondary | ICD-10-CM | POA: Diagnosis not present

## 2022-01-31 DIAGNOSIS — M5136 Other intervertebral disc degeneration, lumbar region: Secondary | ICD-10-CM | POA: Diagnosis not present

## 2022-01-31 DIAGNOSIS — F1120 Opioid dependence, uncomplicated: Secondary | ICD-10-CM | POA: Diagnosis not present

## 2022-01-31 DIAGNOSIS — G894 Chronic pain syndrome: Secondary | ICD-10-CM | POA: Diagnosis not present

## 2022-01-31 DIAGNOSIS — Z79899 Other long term (current) drug therapy: Secondary | ICD-10-CM | POA: Diagnosis not present

## 2022-01-31 DIAGNOSIS — R030 Elevated blood-pressure reading, without diagnosis of hypertension: Secondary | ICD-10-CM | POA: Diagnosis not present

## 2022-02-01 ENCOUNTER — Inpatient Hospital Stay: Payer: Medicare PPO

## 2022-02-01 ENCOUNTER — Inpatient Hospital Stay (HOSPITAL_BASED_OUTPATIENT_CLINIC_OR_DEPARTMENT_OTHER): Payer: Medicare PPO | Admitting: Hematology

## 2022-02-01 ENCOUNTER — Other Ambulatory Visit: Payer: Medicare PPO

## 2022-02-01 ENCOUNTER — Inpatient Hospital Stay: Payer: Medicare PPO | Attending: Hematology

## 2022-02-01 DIAGNOSIS — R519 Headache, unspecified: Secondary | ICD-10-CM | POA: Insufficient documentation

## 2022-02-01 DIAGNOSIS — K7460 Unspecified cirrhosis of liver: Secondary | ICD-10-CM | POA: Diagnosis not present

## 2022-02-01 DIAGNOSIS — Z7961 Long term (current) use of immunomodulator: Secondary | ICD-10-CM | POA: Insufficient documentation

## 2022-02-01 DIAGNOSIS — I10 Essential (primary) hypertension: Secondary | ICD-10-CM | POA: Insufficient documentation

## 2022-02-01 DIAGNOSIS — D509 Iron deficiency anemia, unspecified: Secondary | ICD-10-CM | POA: Diagnosis not present

## 2022-02-01 DIAGNOSIS — Z5112 Encounter for antineoplastic immunotherapy: Secondary | ICD-10-CM | POA: Diagnosis not present

## 2022-02-01 DIAGNOSIS — Z8041 Family history of malignant neoplasm of ovary: Secondary | ICD-10-CM | POA: Insufficient documentation

## 2022-02-01 DIAGNOSIS — E119 Type 2 diabetes mellitus without complications: Secondary | ICD-10-CM | POA: Insufficient documentation

## 2022-02-01 DIAGNOSIS — Z87442 Personal history of urinary calculi: Secondary | ICD-10-CM | POA: Diagnosis not present

## 2022-02-01 DIAGNOSIS — C9000 Multiple myeloma not having achieved remission: Secondary | ICD-10-CM

## 2022-02-01 DIAGNOSIS — D500 Iron deficiency anemia secondary to blood loss (chronic): Secondary | ICD-10-CM

## 2022-02-01 DIAGNOSIS — Z87891 Personal history of nicotine dependence: Secondary | ICD-10-CM | POA: Diagnosis not present

## 2022-02-01 DIAGNOSIS — E785 Hyperlipidemia, unspecified: Secondary | ICD-10-CM | POA: Diagnosis not present

## 2022-02-01 LAB — COMPREHENSIVE METABOLIC PANEL
ALT: 21 U/L (ref 0–44)
AST: 23 U/L (ref 15–41)
Albumin: 3.2 g/dL — ABNORMAL LOW (ref 3.5–5.0)
Alkaline Phosphatase: 94 U/L (ref 38–126)
Anion gap: 6 (ref 5–15)
BUN: 15 mg/dL (ref 8–23)
CO2: 26 mmol/L (ref 22–32)
Calcium: 8.1 mg/dL — ABNORMAL LOW (ref 8.9–10.3)
Chloride: 103 mmol/L (ref 98–111)
Creatinine, Ser: 0.67 mg/dL (ref 0.44–1.00)
GFR, Estimated: >60 mL/min (ref >60)
Glucose, Bld: 222 mg/dL — ABNORMAL HIGH (ref 70–99)
Potassium: 4 mmol/L (ref 3.5–5.1)
Sodium: 135 mmol/L (ref 135–145)
Total Bilirubin: 0.8 mg/dL (ref 0.3–1.2)
Total Protein: 6 g/dL — ABNORMAL LOW (ref 6.5–8.1)

## 2022-02-01 LAB — CBC WITH DIFFERENTIAL/PLATELET
Abs Immature Granulocytes: 0.01 10*3/uL (ref 0.00–0.07)
Basophils Absolute: 0 10*3/uL (ref 0.0–0.1)
Basophils Relative: 0 %
Eosinophils Absolute: 0.1 10*3/uL (ref 0.0–0.5)
Eosinophils Relative: 1 %
HCT: 32.9 % — ABNORMAL LOW (ref 36.0–46.0)
Hemoglobin: 10.1 g/dL — ABNORMAL LOW (ref 12.0–15.0)
Immature Granulocytes: 0 %
Lymphocytes Relative: 21 %
Lymphs Abs: 0.7 10*3/uL (ref 0.7–4.0)
MCH: 27.7 pg (ref 26.0–34.0)
MCHC: 30.7 g/dL (ref 30.0–36.0)
MCV: 90.1 fL (ref 80.0–100.0)
Monocytes Absolute: 0.2 10*3/uL (ref 0.1–1.0)
Monocytes Relative: 5 %
Neutro Abs: 2.6 10*3/uL (ref 1.7–7.7)
Neutrophils Relative %: 73 %
Platelets: 91 10*3/uL — ABNORMAL LOW (ref 150–400)
RBC: 3.65 MIL/uL — ABNORMAL LOW (ref 3.87–5.11)
RDW: 18.7 % — ABNORMAL HIGH (ref 11.5–15.5)
WBC: 3.6 10*3/uL — ABNORMAL LOW (ref 4.0–10.5)
nRBC: 0 % (ref 0.0–0.2)

## 2022-02-01 LAB — MAGNESIUM: Magnesium: 2 mg/dL (ref 1.7–2.4)

## 2022-02-01 MED ORDER — SODIUM CHLORIDE 0.9% FLUSH
10.0000 mL | INTRAVENOUS | Status: AC
  Administered 2022-02-01: 10 mL

## 2022-02-01 MED ORDER — CYANOCOBALAMIN 1000 MCG/ML IJ SOLN
1000.0000 ug | Freq: Once | INTRAMUSCULAR | Status: AC
  Administered 2022-02-01: 1000 ug via INTRAMUSCULAR
  Filled 2022-02-01: qty 1

## 2022-02-01 MED ORDER — HEPARIN SOD (PORK) LOCK FLUSH 100 UNIT/ML IV SOLN
500.0000 [IU] | Freq: Once | INTRAVENOUS | Status: AC
  Administered 2022-02-01: 500 [IU] via INTRAVENOUS

## 2022-02-01 MED ORDER — DARATUMUMAB-HYALURONIDASE-FIHJ 1800-30000 MG-UT/15ML ~~LOC~~ SOLN
1800.0000 mg | Freq: Once | SUBCUTANEOUS | Status: AC
  Administered 2022-02-01: 1800 mg via SUBCUTANEOUS
  Filled 2022-02-01: qty 15

## 2022-02-01 NOTE — Progress Notes (Signed)
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  PRIOR THERAPY: None  NGS Results: Not applicable  CURRENT THERAPY: Daratumumab, lenalidomide and dexamethasone  BRIEF ONCOLOGIC HISTORY:  Oncology History  Multiple myeloma ([place])  11/30/2021 Initial Diagnosis   Multiple myeloma ([place])   11/30/2021 Cancer Staging   Staging form: Plasma Cell Myeloma and Plasma Cell Disorders, AJCC 8th Edition - Clinical stage from 11/30/2021: RISS Stage II (Beta-2-microglobulin (mg/L): 2.8, Albumin (g/dL): 3, ISS: Stage II, High-risk cytogenetics: Absent, LDH: Normal) - Signed by Derek Jack, MD on 11/30/2021 Histopathologic type: Multiple myeloma Stage prefix: Initial diagnosis Beta 2 microglobulin range (mg/L): Less than 3.5 Albumin range (g/dL): Less than 3.5 Cytogenetics: t(11;14) translocation Serum calcium level: Normal   12/14/2021 -  Chemotherapy   Patient is on Treatment Plan : MYELOMA  Daratumumab SQ + Lenalidomide + Dexamethasone (DaraRd) q28d       CANCER STAGING:  Cancer Staging  Multiple myeloma ([place]) Staging form: Plasma Cell Myeloma and Plasma Cell Disorders, AJCC 8th Edition - Clinical stage from 11/30/2021: RISS Stage II (Beta-2-microglobulin (mg/L): 2.8, Albumin (g/dL): 3, ISS: Stage II, High-risk cytogenetics: Absent, LDH: Normal) - Signed by Derek Jack, MD on 11/30/2021    INTERVAL HISTORY:  Savannah Burns 71 y.o. female seen for follow-up of multiple myeloma.  She is tolerating daratumumab, lenalidomide and dexamethasone reasonably well.  She is taking dexamethasone 20 mg weekly.  She reports occasional severe headaches since the start of treatments.  Headache is on the left side of the head towards the back and lasts about 3 to 5 minutes.   Dexamethasone is also making her sweat a lot.    REVIEW OF SYSTEMS:  Review of Systems  Respiratory:  Positive for shortness of breath (On 3 L nasal canula).   Neurological:  Positive for headaches.  All other systems reviewed and are negative.    PAST MEDICAL/SURGICAL HISTORY:  Past Medical History:  Diagnosis Date   Adrenal adenoma, left    Stable   Anxiety    Arthritis    bilateral hands   Depression    Diabetes mellitus, type 2 ([place]) 08/12/2008   Qualifier: Diagnosis of  By: Deborra Medina MD, Tal[place]     Dyspnea    Esophageal varices (H[facility])    Grade II diastolic dysfunction    History of kidney stones    Hyperlipidemia    Hypertension    Lower back pain    Lower GI bleed 03/19/2020   Panic attacks    Pneumonia    currently taking antibiotic and prednisone for early stages of pneumonia   Pulmonary nodules    bilateral   Skin cancer    face   Past Surgical History:  Procedure Laterality Date   BIOPSY  04/07/2020   Procedure: BIOPSY;  Surgeon: Eloise Harman, DO;  [facility];  Service: Endoscopy;;   Breast Cystectomy  Right    CESAREAN SECTION     COLONOSCOPY WITH PROPOFOL N/A 01/25/2020   Dr. Abbey Chatters: Nonbleeding internal hemorrhoids, diverticulosis, 5 mm polyp removed from the ascending colon, 10 mm polyp removed from the sigmoid colon, 30 mm polyp (tubulovillous adenoma with no high-grade dysplasia) removed from the transverse colon via piecemeal status post tattoo.  Other polyps were tubular adenomas.  3 month surveillance colonoscopy recommended.   COLONOSCOPY WITH PROPOFOL N/A 04/07/2020   Procedure: COLONOSCOPY WITH PROPOFOL;  Surgeon: Eloise Harman, DO;  [facility];  Service: Endoscopy;  Laterality: N/A;  3:00pm, pt knows new time per office   CYSTOSCOPY/URETEROSCOPY/HOLMIUM LASER/STENT PLACEMENT Bilateral 03/01/2019   Procedure: CYSTOSCOPY/RETROGRADEURETEROSCOPY/HOLMIUM LASER/STENT PLACEMENT;  Surgeon: Ceasar Mons, MD;   Location: [facility];  Service: Urology;  Laterality: Bilateral;  ONLY NEEDS 60 MIN   ESOPHAGOGASTRODUODENOSCOPY (EGD) WITH PROPOFOL N/A 01/25/2020   Dr. Abbey Chatters: 4 columns grade 1 esophageal varices   ESOPHAGOGASTRODUODENOSCOPY (EGD) WITH PROPOFOL N/A 05/18/2020   Procedure: ESOPHAGOGASTRODUODENOSCOPY (EGD) WITH PROPOFOL;  Surgeon: Eloise Harman, DO;  [facility];  Service: Endoscopy;  Laterality: N/A;   ESOPHAGOGASTRODUODENOSCOPY (EGD) WITH PROPOFOL N/A 07/28/2020   Procedure: ESOPHAGOGASTRODUODENOSCOPY (EGD) WITH PROPOFOL;  Surgeon: Eloise Harman, DO;  [facility];  Service: Endoscopy;  Laterality: N/A;  am or early PM due to givens capsule placement   GIVENS CAPSULE STUDY N/A 05/18/2020   Procedure: [place];  Surgeon: Harvel Quale, MD;  [facility];  Service: Gastroenterology;  Laterality: N/A;   GIVENS CAPSULE STUDY N/A 07/28/2020   Procedure: GIVENS CAPSULE STUDY;  Surgeon: Eloise Harman, DO;  [facility];  Service: Endoscopy;  Laterality: N/A;   IR IMAGING GUIDED PORT INSERTION  12/02/2021   POLYPECTOMY  01/25/2020   Procedure: POLYPECTOMY;  Surgeon: Eloise Harman, DO;  [facility];  Service: Endoscopy;;   POLYPECTOMY  04/07/2020   Procedure: POLYPECTOMY INTESTINAL;  Surgeon: Eloise Harman, DO;  [facility];  Service: Endoscopy;;   SKIN CANCER EXCISION     Face   SPINE SURGERY     SUBMUCOSAL TATTOO INJECTION  01/25/2020   Procedure: SUBMUCOSAL TATTOO INJECTION;  Surgeon: Eloise Harman, DO;  [facility];  Service: Endoscopy;;     SOCIAL HISTORY:  Social History   Socioeconomic History   Marital status: Widowed    Spouse name: Not on file   Number of children: 2   Years of education: 14   Highest education level: Not on file  Occupational History   Occupation: Retired   Tobacco Use   Smoking status: Former    Packs/day: 1.50    Years: 40.00    Total pack  years: 60.00    Types: Cigarettes    Quit date: 04/29/2015    Years since quitting: 6.7   Smokeless tobacco: Never   Tobacco comments:    Quit smoking 04/2015- Previous 1.5 ppd smoker  Vaping Use   Vaping Use: Never used  Substance and Sexual Activity   Alcohol use: No    Alcohol/week: 0.0 standard drinks of alcohol   Drug use: No   Sexual activity: Not Currently    Birth control/protection: Post-menopausal  Other Topics Concern   Not on file  Social History Narrative   Her 36 year old granddaughter lives with her - one daughter lives nearby, but she doesn't have a good relationship with her. Has a great relationship with other daughter who lives 1.5 hrs away - talks to her daily on the phone.   Social Determinants of Health   Financial Resource Strain: Medium Risk (07/27/2021)   Overall Financial Resource Strain (CARDIA)    Difficulty of Paying Living Expenses: Somewhat hard  Food Insecurity: Food Insecurity Present (07/27/2021)   Hunger Vital Sign    Worried About Running Out of Food  in the Last Year: Sometimes true    Ran Out of Food in the Last Year: Never true  Transportation Needs: No Transportation Needs (07/27/2021)   PRAPARE - Hydrologist (Medical): No    Lack of Transportation (Non-Medical): No  Physical Activity: Inactive (07/27/2021)   Exercise Vital Sign    Days of Exercise per Week: 0 days    Minutes of Exercise per Session: 0 min  Stress: Stress Concern Present (07/27/2021)   [place]    Feeling of Stress : To some extent  Social Connections: Socially Isolated (07/27/2021)   Social Connection and Isolation Panel [NHANES]    Frequency of Communication with Friends and Family: More than three times a week    Frequency of Social Gatherings with Friends and Family: Once a week    Attends Religious Services: Never    Marine scientist or Organizations: No    Attends Theatre manager Meetings: Never    Marital Status: Widowed  Intimate Partner Violence: Not At Risk (07/27/2021)   Humiliation, Afraid, Rape, and Kick questionnaire    Fear of Current or Ex-Partner: No    Emotionally Abused: No    Physically Abused: No    Sexually Abused: No    FAMILY HISTORY:  Family History  Problem Relation Age of Onset   Diabetes Father    Heart disease Father 67       MI   Hypertension Father    Anemia Mother        Transfusion dependent   COPD Sister    Cancer Paternal Grandmother 35       Pancreatic    CURRENT MEDICATIONS:  Outpatient Encounter Medications as of 02/01/2022  Medication Sig Note   acyclovir (ZOVIRAX) 400 MG tablet Take 1 tablet (400 mg total) by mouth 2 (two) times daily.    Blood Glucose Calibration (TRUE METRIX LEVEL 1) Low SOLN Use w/ glucose monitor Dx E11.9    Blood Glucose Monitoring Suppl (TRUE METRIX AIR GLUCOSE METER) w/Device KIT Check BS daily Dx E11.9    carvedilol (COREG) 6.25 MG tablet Take 6.25 mg by mouth 2 (two) times daily.    Cholecalciferol (VITAMIN D) 50 MCG (2000 UT) tablet Take 2,000 Units by mouth daily.    clobetasol (TEMOVATE) 0.05 % external solution Apply topically.    CONSTULOSE 10 GM/15ML solution Take by mouth.    Cyanocobalamin (B-12 COMPLIANCE INJECTION) 1000 MCG/ML KIT Inject 1,000 mcg as directed every 30 (thirty) days.    Cyanocobalamin (VITAMIN B-12 IJ) Inject 1,000 mcg as directed every 30 (thirty) days.    daratumumab-hyaluronidase-fihj (DARZALEX FASPRO) 1800-30000 MG-UT/15ML SOLN Inject 1,800 mg into the skin once a week.    desvenlafaxine (PRISTIQ) 50 MG 24 hr tablet Take 1 tablet (50 mg total) by mouth daily.    dexamethasone (DECADRON) 4 MG tablet Take 5 tablets (20 mg total) by mouth once a week.    esomeprazole (NEXIUM) 20 MG capsule Take 1 capsule (20 mg total) by mouth daily at 12 noon.    fluticasone (CUTIVATE) 0.05 % cream Apply 1 Application topically daily.    furosemide (LASIX) 20 MG tablet  Take 20 mg by mouth. bid    glucose blood (TRUE METRIX BLOOD GLUCOSE TEST) test strip Check BS daily Dx E11.9    lenalidomide (REVLIMID) 15 MG capsule TAKE 1 CAPSULE BY MOUTH EVERY DAY FOR 21 DAYS ON THEN 7 DAYS OFF  levofloxacin (LEVAQUIN) 750 MG tablet Take 1 tablet (750 mg total) by mouth daily.    lidocaine-prilocaine (EMLA) cream Apply 1 Application topically as needed. Apply a small amount to port a cath site and cover with plastic wrap 1 hour prior to infusion appointments    lovastatin (MEVACOR) 20 MG tablet Take 1 tablet (20 mg total) by mouth at bedtime.    omeprazole (PRILOSEC) 20 MG capsule Take 20 mg by mouth 2 (two) times daily.    oxyCODONE-acetaminophen (PERCOCET) 10-325 MG tablet Take 1 tablet by mouth every 6 (six) hours as needed for pain.    OXYGEN Inhale 3 L into the lungs continuous. 07/27/2021: Now using 5lpm when walking   OZEMPIC, 0.25 OR 0.5 MG/DOSE, 2 MG/3ML SOPN Inject into the skin.    potassium chloride SA (KLOR-CON) 20 MEQ tablet Take 2 tablets (40 mEq total) by mouth 2 (two) times daily. (Patient taking differently: Take 40 mEq by mouth daily.)    prochlorperazine (COMPAZINE) 10 MG tablet Take 1 tablet (10 mg total) by mouth every 6 (six) hours as needed for nausea or vomiting.    TRUEplus Lancets 33G MISC Check BS daily Dx E11.9    [DISCONTINUED] Alcohol Swabs (B-D SINGLE USE SWABS REGULAR) PADS Check BS daily Dx E11.9    torsemide (DEMADEX) 20 MG tablet Take 3 tablets (60 mg total) by mouth 2 (two) times daily. (Patient taking differently: Take 50 mg by mouth 2 (two) times daily.)    [DISCONTINUED] albuterol (PROVENTIL HFA;VENTOLIN HFA) 108 (90 Base) MCG/ACT inhaler Inhale 2 puffs into the lungs every 6 (six) hours as needed for wheezing or shortness of breath.    [DISCONTINUED] ferrous sulfate 325 (65 FE) MG tablet Take 1 tablet (325 mg total) by mouth 2 (two) times daily with a meal. (Patient taking differently: Take 325 mg by mouth daily with breakfast.)    No  facility-administered encounter medications on file as of 02/01/2022.    ALLERGIES:  Allergies  Allergen Reactions   Keflex [Cephalexin] Nausea And Vomiting     PHYSICAL EXAM:  ECOG Performance status: 1  There were no vitals filed for this visit. There were no vitals filed for this visit. Physical Exam Vitals reviewed.  Constitutional:      Appearance: Normal appearance.  Cardiovascular:     Rate and Rhythm: Normal rate and regular rhythm.     Heart sounds: Normal heart sounds.  Pulmonary:     Effort: Pulmonary effort is normal.     Breath sounds: Normal breath sounds.  Abdominal:     Palpations: Abdomen is soft.  Neurological:     Mental Status: She is alert.  Psychiatric:        Mood and Affect: Mood normal.        Behavior: Behavior normal.     LABORATORY DATA:  I have reviewed the labs as listed.  CBC    Component Value Date/Time   WBC 3.6 (L) 02/01/2022 1215   RBC 3.65 (L) 02/01/2022 1215   HGB 10.1 (L) 02/01/2022 1215   HGB 9.1 (L) 02/26/2021 1129   HCT 32.9 (L) 02/01/2022 1215   HCT 30.5 (L) 02/26/2021 1129   PLT 91 (L) 02/01/2022 1215   PLT 104 (L) 02/26/2021 1129   MCV 90.1 02/01/2022 1215   MCV 87 02/26/2021 1129   MCH 27.7 02/01/2022 1215   MCHC 30.7 02/01/2022 1215   RDW 18.7 (H) 02/01/2022 1215   RDW 16.4 (H) 02/26/2021 1129   LYMPHSABS 0.7 02/01/2022 1215  LYMPHSABS 1.8 06/24/2020 1529   MONOABS 0.2 02/01/2022 1215   EOSABS 0.1 02/01/2022 1215   EOSABS 0.1 06/24/2020 1529   BASOSABS 0.0 02/01/2022 1215   BASOSABS 0.0 06/24/2020 1529      Latest Ref Rng & Units 02/01/2022   12:15 PM 01/25/2022    9:34 AM 01/18/2022    2:09 PM  CMP  Glucose 70 - 99 mg/dL 222  171  164   BUN 8 - 23 mg/dL _0 Creatinine 0.44 - 1.00 mg/dL 0.67  0.65  0.60   Sodium 135 - 145 mmol/L 135  136  138   Potassium 3.5 - 5.1 mmol/L 4.0  3.8  3.7   Chloride 98 - 111 mmol/L 103  104  103   CO2 22 - 32 mmol/L _1 Calcium 8.9 - 10.3 mg/dL 8.1   8.2  8.4   Total Protein 6.5 - 8.1 g/dL 6.0  6.1  6.2   Total Bilirubin 0.3 - 1.2 mg/dL 0.8  0.7  0.5   Alkaline Phos 38 - 126 U/L 94  88  103   AST 15 - 41 U/L _2 ALT 0 - 44 U/L _3 DIAGNOSTIC IMAGING:  I have independently reviewed the scans and discussed with the patient.  ASSESSMENT: 1.  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  Orderly maturation of erythroid and myeloid series. - Myeloma FISH panel: t(11;14) - Cytogenetics: Failed to grow metaphases - PET scan (11/18/2021): No evidence of active myeloma or plasmacytoma.  Right upper lobe lung infection. - Skull x-rays (10/29/2021) multiple discrete round lucent/lytic lesions in the skull - Worsening M spike and free light chain ratio.  Normal creatinine and calcium.  Multifactorial anemia including blood loss and bone marrow infiltration. - Cycle 1 of DRd started on 12/14/2021 with lenalidomide 15 mg 3 weeks on/1 week off and dexamethasone 20 mg weekly   2.  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  Quit smoking 15 years ago.  No exposure to chemicals or pesticides. - Paternal grandmother had ovarian cancer.  Sister had ovarian cancer.  3.  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  Seen by transplant hepatology service at [facility].     PLAN:   1.  Stage II IgG kappa myeloma, standard risk: - She is tolerating Revlimid 15 mg 3 weeks on/1 week off very well along with dexamethasone 20 mg weekly.  Dexamethasone giving her sweating and possibly headaches. - Reviewed labs from 01/25/2022 which showed M spike improved to 0.4 g from 3.1 g.  Free light chain ratio also improved to 4.03 from 13.  Kappa light chains improved from 1 30-27. - She will proceed with daratumumab today and next week and after that it will be switched to  every other week.  I have recommended her to not take dexamethasone during the week of Thanksgiving and see if it helps with headaches and sweating.  If it does, we may consider decreasing dexamethasone dose to 10 mg weekly.   2.  Iron deficiency anemia from chronic GI bleed: - Last Venofer on 12/21/2021. - Continue B12 injections monthly. - Hemoglobin is staying about 10. - We will check ferritin, iron panel at next visit.  3.  Infection prophylaxis: - Continue acyclovir 400 mg twice daily. - Aspirin on hold due to history of GI bleed.   4.  Cirrhosis: - Continue Lasix 20 mg daily as needed, carvedilol 6.25 mg daily and lactulose 30 mL daily.    Orders placed this encounter:  No orders of the defined types were placed in this encounter.     Derek Jack, MD [place] [phone]

## 2022-02-01 NOTE — Patient Instructions (Addendum)
[place] at [facility] Discharge Instructions   You were seen and examined today by Dr. Delton Coombes.  He reviewed the results of your lab work which are normal/stable.   We will proceed with your treatment today.   After next week, you will come every other week to receive your injection.   Continue Revlimid as prescribed.   We will see you back in 4 weeks.    Thank you for choosing [place] at [facility] to provide your oncology and hematology care.  To afford each patient quality time with our provider, please arrive at least 15 minutes before your scheduled appointment time.   If you have a lab appointment with the [place] please come in thru the Main Entrance and check in at the main information desk.  You need to re-schedule your appointment should you arrive 10 or more minutes late.  We strive to give you quality time with our providers, and arriving late affects you and other patients whose appointments are after yours.  Also, if you no show three or more times for appointments you may be dismissed from the clinic at the providers discretion.     Again, thank you for choosing [facility].  Our hope is that these requests will decrease the amount of time that you wait before being seen by our physicians.       _____________________________________________________________  Should you have questions after your visit to [facility], please contact our office at [phone] and follow the prompts.  Our office hours are 8:00 a.m. and 4:30 p.m. Monday - Friday.  Please note that voicemails left after 4:00 p.m. may not be returned until the following business day.  We are closed weekends and major holidays.  You do have access to a nurse 24-7, just call the main number to the clinic [phone] and do not press any options, hold on the line and a nurse will answer the phone.    For prescription refill  requests, have your pharmacy contact our office and allow 72 hours.    Due to Covid, you will need to wear a mask upon entering the hospital. If you do not have a mask, a mask will be given to you at the Main Entrance upon arrival. For doctor visits, patients may have 1 support person age 88 or older with them. For treatment visits, patients can not have anyone with them due to social distancing guidelines and our immunocompromised population.

## 2022-02-01 NOTE — Progress Notes (Signed)
Patient presents today for Darzalex Faspro injection and follow up with Dr. Delton Coombes. Platelets 91. All other labs within parameters for treatment. Vital signs within parameters for treatment.   Verbal orders received to proceed with treatment. Platelets 91. Per A. Ouida Sills RN / Dr. Delton Coombes proceed with treatment .   Patient states all pre-medications were taken prior to arrival. Tylenol 659 mg, Benadryl 50 mg , and Decadron 20 mg PO.   Treatment given today per MD orders. Tolerated without adverse affects. Vital signs stable. No complaints at this time. Discharged from clinic by wheel chair in stable condition. Alert and oriented x 3. F/U with [facility] as scheduled.

## 2022-02-01 NOTE — Progress Notes (Signed)
Patient has been examined by Dr. Katragadda, and vital signs and labs have been reviewed. ANC, Creatinine, LFTs, hemoglobin, and platelets are within treatment parameters per M.D. - pt may proceed with treatment.  Primary RN and pharmacy notified.  

## 2022-02-01 NOTE — Patient Instructions (Signed)
[facility]  Discharge Instructions: Thank you for choosing [facility] to provide your oncology and hematology care.  If you have a lab appointment with the [facility], please come in thru the Main Entrance and check in at the main information desk.  Wear comfortable clothing and clothing appropriate for easy access to any Portacath or PICC line.   We strive to give you quality time with your provider. You may need to reschedule your appointment if you arrive late (15 or more minutes).  Arriving late affects you and other patients whose appointments are after yours.  Also, if you miss three or more appointments without notifying the office, you may be dismissed from the clinic at the provider's discretion.      For prescription refill requests, have your pharmacy contact our office and allow 72 hours for refills to be completed.    Today you received the following chemotherapy and/or immunotherapy agents Darzalex Faspro. Daratumumab Injection What is this medication? DARATUMUMAB (dar a toom ue mab) treats multiple myeloma, a type of bone marrow cancer. It works by helping your immune system slow or stop the spread of cancer cells. It is a monoclonal antibody. This medicine may be used for other purposes; ask your health care provider or pharmacist if you have questions. COMMON BRAND NAME(S): DARZALEX What should I tell my care team before I take this medication? They need to know if you have any of these conditions: Hereditary fructose intolerance Infection, such as chickenpox, herpes, hepatitis B virus Lung or breathing disease, such as asthma, COPD An unusual or allergic reaction to daratumumab, sorbitol, other medications, foods, dyes, or preservatives Pregnant or trying to get pregnant Breast-feeding How should I use this medication? This medication is injected into a vein. It is given by your care team in a hospital or clinic setting. Talk to  your care team about the use of this medication in children. Special care may be needed. Overdosage: If you think you have taken too much of this medicine contact a poison control center or emergency room at once. NOTE: This medicine is only for you. Do not share this medicine with others. What if I miss a dose? Keep appointments for follow-up doses. It is important not to miss your dose. Call your care team if you are unable to keep an appointment. What may interact with this medication? Interactions have not been studied. This list may not describe all possible interactions. Give your health care provider a list of all the medicines, herbs, non-prescription drugs, or dietary supplements you use. Also tell them if you smoke, drink alcohol, or use illegal drugs. Some items may interact with your medicine. What should I watch for while using this medication? Your condition will be monitored carefully while you are receiving this medication. This medication can cause serious allergic reactions. To reduce your risk, your care team may give you other medication to take before receiving this one. Be sure to follow the directions from your care team. This medication can affect the results of blood tests to match your blood type. These changes can last for up to 6 months after the final dose. Your care team will do blood tests to match your blood type before you start treatment. Tell all of your care team that you are being treated with this medication before receiving a blood transfusion. This medication can affect the results of some tests used to determine treatment response; extra tests may be needed  to evaluate response. Talk to your care team if you wish to become pregnant or think you are pregnant. This medication can cause serious birth defects if taken during pregnancy and for 3 months after the last dose. A reliable form of contraception is recommended while taking this medication and for 3 months  after the last dose. Talk to your care team about effective forms of contraception. Do not breast-feed while taking this medication. What side effects may I notice from receiving this medication? Side effects that you should report to your care team as soon as possible: Allergic reactions--skin rash, itching, hives, swelling of the face, lips, tongue, or throat Infection--fever, chills, cough, sore throat, wounds that don't heal, pain or trouble when passing urine, general feeling of discomfort or being unwell Infusion reactions--chest pain, shortness of breath or trouble breathing, feeling faint or lightheaded Unusual bruising or bleeding Side effects that usually do not require medical attention (report to your care team if they continue or are bothersome): Constipation Diarrhea Fatigue Nausea Pain, tingling, or numbness in the hands or feet Swelling of the ankles, hands, or feet This list may not describe all possible side effects. Call your doctor for medical advice about side effects. You may report side effects to FDA at 1-800-FDA-1088. Where should I keep my medication? This medication is given in a hospital or clinic. It will not be stored at home. NOTE: This sheet is a summary. It may not cover all possible information. If you have questions about this medicine, talk to your doctor, pharmacist, or health care provider.  2023 Elsevier/Gold Standard (2021-07-07 00:00:00)       To help prevent nausea and vomiting after your treatment, we encourage you to take your nausea medication as directed.  BELOW ARE SYMPTOMS THAT SHOULD BE REPORTED IMMEDIATELY: *FEVER GREATER THAN 100.4 F (38 C) OR HIGHER *CHILLS OR SWEATING *NAUSEA AND VOMITING THAT IS NOT CONTROLLED WITH YOUR NAUSEA MEDICATION *UNUSUAL SHORTNESS OF BREATH *UNUSUAL BRUISING OR BLEEDING *URINARY PROBLEMS (pain or burning when urinating, or frequent urination) *BOWEL PROBLEMS (unusual diarrhea, constipation, pain near the  anus) TENDERNESS IN MOUTH AND THROAT WITH OR WITHOUT PRESENCE OF ULCERS (sore throat, sores in mouth, or a toothache) UNUSUAL RASH, SWELLING OR PAIN  UNUSUAL VAGINAL DISCHARGE OR ITCHING   Items with * indicate a potential emergency and should be followed up as soon as possible or go to the Emergency Department if any problems should occur.  Please show the CHEMOTHERAPY ALERT CARD or IMMUNOTHERAPY ALERT CARD at check-in to the Emergency Department and triage nurse.  Should you have questions after your visit or need to cancel or reschedule your appointment, please contact [facility] [phone]  and follow the prompts.  Office hours are 8:00 a.m. to 4:30 p.m. Monday - Friday. Please note that voicemails left after 4:00 p.m. may not be returned until the following business day.  We are closed weekends and major holidays. You have access to a nurse at all times for urgent questions. Please call the main number to the clinic [phone] and follow the prompts.  For any non-urgent questions, you may also contact your provider using MyChart. We now offer e-Visits for anyone 25 and older to request care online for non-urgent symptoms. For details visit [email or web address].   Also download the MyChart app! Go to the app store, search "MyChart", open the app, select [facility], and log in with your MyChart username and password.  Masks are optional in the cancer centers.  If you would like for your care team to wear a mask while they are taking care of you, please let them know. You may have one support person who is at least 72 years old accompany you for your appointments.

## 2022-02-08 ENCOUNTER — Inpatient Hospital Stay: Payer: Medicare PPO

## 2022-02-08 DIAGNOSIS — K7460 Unspecified cirrhosis of liver: Secondary | ICD-10-CM | POA: Diagnosis not present

## 2022-02-08 DIAGNOSIS — Z87442 Personal history of urinary calculi: Secondary | ICD-10-CM | POA: Diagnosis not present

## 2022-02-08 DIAGNOSIS — E119 Type 2 diabetes mellitus without complications: Secondary | ICD-10-CM | POA: Diagnosis not present

## 2022-02-08 DIAGNOSIS — D500 Iron deficiency anemia secondary to blood loss (chronic): Secondary | ICD-10-CM

## 2022-02-08 DIAGNOSIS — Z5112 Encounter for antineoplastic immunotherapy: Secondary | ICD-10-CM | POA: Diagnosis not present

## 2022-02-08 DIAGNOSIS — D509 Iron deficiency anemia, unspecified: Secondary | ICD-10-CM | POA: Diagnosis not present

## 2022-02-08 DIAGNOSIS — C9000 Multiple myeloma not having achieved remission: Secondary | ICD-10-CM

## 2022-02-08 DIAGNOSIS — I10 Essential (primary) hypertension: Secondary | ICD-10-CM | POA: Diagnosis not present

## 2022-02-08 DIAGNOSIS — E785 Hyperlipidemia, unspecified: Secondary | ICD-10-CM | POA: Diagnosis not present

## 2022-02-08 DIAGNOSIS — R519 Headache, unspecified: Secondary | ICD-10-CM | POA: Diagnosis not present

## 2022-02-08 LAB — CBC WITH DIFFERENTIAL/PLATELET
Abs Immature Granulocytes: 0.01 10*3/uL (ref 0.00–0.07)
Basophils Absolute: 0 10*3/uL (ref 0.0–0.1)
Basophils Relative: 0 %
Eosinophils Absolute: 0.1 10*3/uL (ref 0.0–0.5)
Eosinophils Relative: 5 %
HCT: 31.6 % — ABNORMAL LOW (ref 36.0–46.0)
Hemoglobin: 9.5 g/dL — ABNORMAL LOW (ref 12.0–15.0)
Immature Granulocytes: 0 %
Lymphocytes Relative: 49 %
Lymphs Abs: 1.1 10*3/uL (ref 0.7–4.0)
MCH: 27.5 pg (ref 26.0–34.0)
MCHC: 30.1 g/dL (ref 30.0–36.0)
MCV: 91.3 fL (ref 80.0–100.0)
Monocytes Absolute: 0.4 10*3/uL (ref 0.1–1.0)
Monocytes Relative: 16 %
Neutro Abs: 0.7 10*3/uL — ABNORMAL LOW (ref 1.7–7.7)
Neutrophils Relative %: 30 %
Platelets: 90 10*3/uL — ABNORMAL LOW (ref 150–400)
RBC: 3.46 MIL/uL — ABNORMAL LOW (ref 3.87–5.11)
RDW: 18.4 % — ABNORMAL HIGH (ref 11.5–15.5)
WBC: 2.4 10*3/uL — ABNORMAL LOW (ref 4.0–10.5)
nRBC: 0 % (ref 0.0–0.2)

## 2022-02-08 LAB — MAGNESIUM: Magnesium: 1.7 mg/dL (ref 1.7–2.4)

## 2022-02-08 LAB — COMPREHENSIVE METABOLIC PANEL
ALT: 18 U/L (ref 0–44)
AST: 15 U/L (ref 15–41)
Albumin: 2.9 g/dL — ABNORMAL LOW (ref 3.5–5.0)
Alkaline Phosphatase: 83 U/L (ref 38–126)
Anion gap: 5 (ref 5–15)
BUN: 12 mg/dL (ref 8–23)
CO2: 27 mmol/L (ref 22–32)
Calcium: 8.1 mg/dL — ABNORMAL LOW (ref 8.9–10.3)
Chloride: 106 mmol/L (ref 98–111)
Creatinine, Ser: 0.58 mg/dL (ref 0.44–1.00)
GFR, Estimated: >60 mL/min (ref >60)
Glucose, Bld: 129 mg/dL — ABNORMAL HIGH (ref 70–99)
Potassium: 3.7 mmol/L (ref 3.5–5.1)
Sodium: 138 mmol/L (ref 135–145)
Total Bilirubin: 0.4 mg/dL (ref 0.3–1.2)
Total Protein: 5.7 g/dL — ABNORMAL LOW (ref 6.5–8.1)

## 2022-02-08 MED ORDER — DARATUMUMAB-HYALURONIDASE-FIHJ 1800-30000 MG-UT/15ML ~~LOC~~ SOLN
1800.0000 mg | Freq: Once | SUBCUTANEOUS | Status: AC
  Administered 2022-02-08: 1800 mg via SUBCUTANEOUS
  Filled 2022-02-08: qty 15

## 2022-02-08 MED ORDER — HEPARIN SOD (PORK) LOCK FLUSH 100 UNIT/ML IV SOLN
500.0000 [IU] | Freq: Once | INTRAVENOUS | Status: AC
  Administered 2022-02-08: 500 [IU] via INTRAVENOUS

## 2022-02-08 MED ORDER — SODIUM CHLORIDE 0.9% FLUSH
10.0000 mL | Freq: Once | INTRAVENOUS | Status: AC
  Administered 2022-02-08: 10 mL via INTRAVENOUS

## 2022-02-08 MED ORDER — FILGRASTIM-SNDZ 480 MCG/0.8ML IJ SOSY
480.0000 ug | PREFILLED_SYRINGE | Freq: Once | INTRAMUSCULAR | Status: AC
  Administered 2022-02-08: 480 ug via SUBCUTANEOUS
  Filled 2022-02-08: qty 0.8

## 2022-02-08 NOTE — Patient Instructions (Signed)
[facility]  Discharge Instructions: Thank you for choosing [facility] to provide your oncology and hematology care.  If you have a lab appointment with the [facility], please come in thru the Main Entrance and check in at the main information desk.  Wear comfortable clothing and clothing appropriate for easy access to any Portacath or PICC line.   We strive to give you quality time with your provider. You may need to reschedule your appointment if you arrive late (15 or more minutes).  Arriving late affects you and other patients whose appointments are after yours.  Also, if you miss three or more appointments without notifying the office, you may be dismissed from the clinic at the provider's discretion.      For prescription refill requests, have your pharmacy contact our office and allow 72 hours for refills to be completed.    Today you received the following chemotherapy and/or immunotherapy agents Daratumumab/Zarxio.  Daratumumab Injection What is this medication? DARATUMUMAB (dar a toom ue mab) treats multiple myeloma, a type of bone marrow cancer. It works by helping your immune system slow or stop the spread of cancer cells. It is a monoclonal antibody. This medicine may be used for other purposes; ask your health care provider or pharmacist if you have questions. COMMON BRAND NAME(S): DARZALEX What should I tell my care team before I take this medication? They need to know if you have any of these conditions: Hereditary fructose intolerance Infection, such as chickenpox, herpes, hepatitis B virus Lung or breathing disease, such as asthma, COPD An unusual or allergic reaction to daratumumab, sorbitol, other medications, foods, dyes, or preservatives Pregnant or trying to get pregnant Breast-feeding How should I use this medication? This medication is injected into a vein. It is given by your care team in a hospital or clinic setting. Talk  to your care team about the use of this medication in children. Special care may be needed. Overdosage: If you think you have taken too much of this medicine contact a poison control center or emergency room at once. NOTE: This medicine is only for you. Do not share this medicine with others. What if I miss a dose? Keep appointments for follow-up doses. It is important not to miss your dose. Call your care team if you are unable to keep an appointment. What may interact with this medication? Interactions have not been studied. This list may not describe all possible interactions. Give your health care provider a list of all the medicines, herbs, non-prescription drugs, or dietary supplements you use. Also tell them if you smoke, drink alcohol, or use illegal drugs. Some items may interact with your medicine. What should I watch for while using this medication? Your condition will be monitored carefully while you are receiving this medication. This medication can cause serious allergic reactions. To reduce your risk, your care team may give you other medication to take before receiving this one. Be sure to follow the directions from your care team. This medication can affect the results of blood tests to match your blood type. These changes can last for up to 6 months after the final dose. Your care team will do blood tests to match your blood type before you start treatment. Tell all of your care team that you are being treated with this medication before receiving a blood transfusion. This medication can affect the results of some tests used to determine treatment response; extra tests may be needed  to evaluate response. Talk to your care team if you wish to become pregnant or think you are pregnant. This medication can cause serious birth defects if taken during pregnancy and for 3 months after the last dose. A reliable form of contraception is recommended while taking this medication and for 3 months  after the last dose. Talk to your care team about effective forms of contraception. Do not breast-feed while taking this medication. What side effects may I notice from receiving this medication? Side effects that you should report to your care team as soon as possible: Allergic reactions--skin rash, itching, hives, swelling of the face, lips, tongue, or throat Infection--fever, chills, cough, sore throat, wounds that don't heal, pain or trouble when passing urine, general feeling of discomfort or being unwell Infusion reactions--chest pain, shortness of breath or trouble breathing, feeling faint or lightheaded Unusual bruising or bleeding Side effects that usually do not require medical attention (report to your care team if they continue or are bothersome): Constipation Diarrhea Fatigue Nausea Pain, tingling, or numbness in the hands or feet Swelling of the ankles, hands, or feet This list may not describe all possible side effects. Call your doctor for medical advice about side effects. You may report side effects to FDA at 1-800-FDA-1088. Where should I keep my medication? This medication is given in a hospital or clinic. It will not be stored at home. NOTE: This sheet is a summary. It may not cover all possible information. If you have questions about this medicine, talk to your doctor, pharmacist, or health care provider.  2023 Elsevier/Gold Standard (2021-07-07 00:00:00)    Filgrastim Injection What is this medication? FILGRASTIM (fil GRA stim) lowers the risk of infection in people who are receiving chemotherapy. It works by Building control surveyor make more white blood cells, which protects your body from infection. It may also be used to help people who have been exposed to high doses of radiation. It can be used to help prepare your body before a stem cell transplant. It works by helping your bone marrow make and release stem cells into the blood. This medicine may be used for other  purposes; ask your health care provider or pharmacist if you have questions. COMMON BRAND NAME(S): Neupogen, Nivestym, Releuko, Zarxio What should I tell my care team before I take this medication? They need to know if you have any of these conditions: History of blood diseases, such as sickle cell anemia Kidney disease Recent or ongoing radiation An unusual or allergic reaction to filgrastim, pegfilgrastim, latex, rubber, other medications, foods, dyes, or preservatives Pregnant or trying to get pregnant Breast-feeding How should I use this medication? This medication is injected under the skin or into a vein. It is usually given by your care team in a hospital or clinic setting. It may be given at home. If you get this medication at home, you will be taught how to prepare and give it. Use exactly as directed. Take it as directed on the prescription label at the same time every day. Keep taking it unless your care team tells you to stop. It is important that you put your used needles and syringes in a special sharps container. Do not put them in a trash can. If you do not have a sharps container, call your pharmacist or care team to get one. This medication comes with INSTRUCTIONS FOR USE. Ask your pharmacist for directions on how to use this medication. Read the information carefully. Talk to your pharmacist  or care team if you have questions. Talk to your care team about the use of this medication in children. While it may be prescribed for children for selected conditions, precautions do apply. Overdosage: If you think you have taken too much of this medicine contact a poison control center or emergency room at once. NOTE: This medicine is only for you. Do not share this medicine with others. What if I miss a dose? It is important not to miss any doses. Talk to your care team about what to do if you miss a dose. What may interact with this medication? Medications that may cause a release of  neutrophils, such as lithium This list may not describe all possible interactions. Give your health care provider a list of all the medicines, herbs, non-prescription drugs, or dietary supplements you use. Also tell them if you smoke, drink alcohol, or use illegal drugs. Some items may interact with your medicine. What should I watch for while using this medication? Your condition will be monitored carefully while you are receiving this medication. You may need bloodwork while taking this medication. Talk to your care team about your risk of cancer. You may be more at risk for certain types of cancer if you take this medication. What side effects may I notice from receiving this medication? Side effects that you should report to your care team as soon as possible: Allergic reactions--skin rash, itching, hives, swelling of the face, lips, tongue, or throat Capillary leak syndrome--stomach or muscle pain, unusual weakness or fatigue, feeling faint or lightheaded, decrease in the amount of urine, swelling of the ankles, hands, or feet, trouble breathing High white blood cell level--fever, fatigue, trouble breathing, night sweats, change in vision, weight loss Inflammation of the aorta--fever, fatigue, back, chest, or stomach pain, severe headache Kidney injury (glomerulonephritis)--decrease in the amount of urine, red or dark Petrina Melby urine, foamy or bubbly urine, swelling of the ankles, hands, or feet Shortness of breath or trouble breathing Spleen injury--pain in upper left stomach or shoulder Unusual bruising or bleeding Side effects that usually do not require medical attention (report to your care team if they continue or are bothersome): Back pain Bone pain Fatigue Fever Headache Nausea This list may not describe all possible side effects. Call your doctor for medical advice about side effects. You may report side effects to FDA at 1-800-FDA-1088. Where should I keep my medication? Keep out  of the reach of children and pets. Keep this medication in the original packaging until you are ready to take it. Protect from light. See product for storage information. Each product may have different instructions. Get rid of any unused medication after the expiration date. To get rid of medications that are no longer needed or have expired: Take the medication to a medications take-back program. Check with your pharmacy or law enforcement to find a location. If you cannot return the medication, ask your pharmacist or care team how to get rid of this medication safely. NOTE: This sheet is a summary. It may not cover all possible information. If you have questions about this medicine, talk to your doctor, pharmacist, or health care provider.  2023 Elsevier/Gold Standard (2021-06-22 00:00:00)        To help prevent nausea and vomiting after your treatment, we encourage you to take your nausea medication as directed.  BELOW ARE SYMPTOMS THAT SHOULD BE REPORTED IMMEDIATELY: *FEVER GREATER THAN 100.4 F (38 C) OR HIGHER *CHILLS OR SWEATING *NAUSEA AND VOMITING THAT IS NOT  CONTROLLED WITH YOUR NAUSEA MEDICATION *UNUSUAL SHORTNESS OF BREATH *UNUSUAL BRUISING OR BLEEDING *URINARY PROBLEMS (pain or burning when urinating, or frequent urination) *BOWEL PROBLEMS (unusual diarrhea, constipation, pain near the anus) TENDERNESS IN MOUTH AND THROAT WITH OR WITHOUT PRESENCE OF ULCERS (sore throat, sores in mouth, or a toothache) UNUSUAL RASH, SWELLING OR PAIN  UNUSUAL VAGINAL DISCHARGE OR ITCHING   Items with * indicate a potential emergency and should be followed up as soon as possible or go to the Emergency Department if any problems should occur.  Please show the CHEMOTHERAPY ALERT CARD or IMMUNOTHERAPY ALERT CARD at check-in to the Emergency Department and triage nurse.  Should you have questions after your visit or need to cancel or reschedule your appointment, please contact [facility] [phone]  and follow the prompts.  Office hours are 8:00 a.m. to 4:30 p.m. Monday - Friday. Please note that voicemails left after 4:00 p.m. may not be returned until the following business day.  We are closed weekends and major holidays. You have access to a nurse at all times for urgent questions. Please call the main number to the clinic [phone] and follow the prompts.  For any non-urgent questions, you may also contact your provider using MyChart. We now offer e-Visits for anyone 101 and older to request care online for non-urgent symptoms. For details visit [email or web address].   Also download the MyChart app! Go to the app store, search "MyChart", open the app, select [facility], and log in with your MyChart username and password.  Masks are optional in the cancer centers. If you would like for your care team to wear a mask while they are taking care of you, please let them know. You may have one support person who is at least 72 years old accompany you for your appointments.

## 2022-02-08 NOTE — Progress Notes (Signed)
Patient presents today for Dartumumab injection.  Patient is in satisfactory condition with no new complaints voiced.  Vital signs are stable.  Tylenol, Benadryl, and Decadron were taken at 0900 at home prior to visit.  Labs reviewed. ANC today is 0.7 and platelets are 90.  MD made aware. All other labs are within treatment parameters.   Patient will receive Zarxio 480 mcg today per Dr. Delton Coombes.  Ok to treat with Daratumumab per Dr. Delton Coombes.   Patient tolerated Daratumumab and Zarxio injections with no complaints voiced.  Site clean and dry with no bruising or swelling noted.  No complaints of pain.  Discharged with vital signs stable and no signs or symptoms of distress noted.

## 2022-02-08 NOTE — Progress Notes (Signed)
Patients port flushed without difficulty.  Good blood return noted with no bruising or swelling noted at site.  Band aid applied.  VSS with discharge and left in satisfactory condition with no s/s of distress noted.

## 2022-02-15 ENCOUNTER — Inpatient Hospital Stay: Payer: Medicare PPO

## 2022-02-15 VITALS — BP 132/61 | HR 83 | Temp 98.6°F | Resp 20

## 2022-02-15 DIAGNOSIS — R519 Headache, unspecified: Secondary | ICD-10-CM | POA: Diagnosis not present

## 2022-02-15 DIAGNOSIS — E785 Hyperlipidemia, unspecified: Secondary | ICD-10-CM | POA: Diagnosis not present

## 2022-02-15 DIAGNOSIS — D509 Iron deficiency anemia, unspecified: Secondary | ICD-10-CM | POA: Diagnosis not present

## 2022-02-15 DIAGNOSIS — Z95828 Presence of other vascular implants and grafts: Secondary | ICD-10-CM

## 2022-02-15 DIAGNOSIS — E119 Type 2 diabetes mellitus without complications: Secondary | ICD-10-CM | POA: Diagnosis not present

## 2022-02-15 DIAGNOSIS — K7460 Unspecified cirrhosis of liver: Secondary | ICD-10-CM | POA: Diagnosis not present

## 2022-02-15 DIAGNOSIS — Z5112 Encounter for antineoplastic immunotherapy: Secondary | ICD-10-CM | POA: Diagnosis not present

## 2022-02-15 DIAGNOSIS — Z87442 Personal history of urinary calculi: Secondary | ICD-10-CM | POA: Diagnosis not present

## 2022-02-15 DIAGNOSIS — I10 Essential (primary) hypertension: Secondary | ICD-10-CM | POA: Diagnosis not present

## 2022-02-15 DIAGNOSIS — D500 Iron deficiency anemia secondary to blood loss (chronic): Secondary | ICD-10-CM

## 2022-02-15 DIAGNOSIS — C9000 Multiple myeloma not having achieved remission: Secondary | ICD-10-CM | POA: Diagnosis not present

## 2022-02-15 LAB — CBC WITH DIFFERENTIAL/PLATELET
Abs Immature Granulocytes: 0.02 10*3/uL (ref 0.00–0.07)
Basophils Absolute: 0 10*3/uL (ref 0.0–0.1)
Basophils Relative: 1 %
Eosinophils Absolute: 0.1 10*3/uL (ref 0.0–0.5)
Eosinophils Relative: 2 %
HCT: 31.4 % — ABNORMAL LOW (ref 36.0–46.0)
Hemoglobin: 9.6 g/dL — ABNORMAL LOW (ref 12.0–15.0)
Immature Granulocytes: 1 %
Lymphocytes Relative: 27 %
Lymphs Abs: 0.9 10*3/uL (ref 0.7–4.0)
MCH: 27.5 pg (ref 26.0–34.0)
MCHC: 30.6 g/dL (ref 30.0–36.0)
MCV: 90 fL (ref 80.0–100.0)
Monocytes Absolute: 0.3 10*3/uL (ref 0.1–1.0)
Monocytes Relative: 10 %
Neutro Abs: 2 10*3/uL (ref 1.7–7.7)
Neutrophils Relative %: 59 %
Platelets: 125 10*3/uL — ABNORMAL LOW (ref 150–400)
RBC: 3.49 MIL/uL — ABNORMAL LOW (ref 3.87–5.11)
RDW: 17.9 % — ABNORMAL HIGH (ref 11.5–15.5)
WBC: 3.4 10*3/uL — ABNORMAL LOW (ref 4.0–10.5)
nRBC: 0 % (ref 0.0–0.2)

## 2022-02-15 LAB — SAMPLE TO BLOOD BANK

## 2022-02-15 MED ORDER — SODIUM CHLORIDE 0.9% FLUSH
10.0000 mL | Freq: Once | INTRAVENOUS | Status: AC
  Administered 2022-02-15: 10 mL via INTRAVENOUS

## 2022-02-15 MED ORDER — HEPARIN SOD (PORK) LOCK FLUSH 100 UNIT/ML IV SOLN
500.0000 [IU] | Freq: Once | INTRAVENOUS | Status: AC
  Administered 2022-02-15: 500 [IU] via INTRAVENOUS

## 2022-02-15 NOTE — Patient Instructions (Signed)
[facility]  Discharge Instructions: Thank you for choosing [facility] to provide your oncology and hematology care.  If you have a lab appointment with the [facility], please come in thru the Main Entrance and check in at the main information desk.  Wear comfortable clothing and clothing appropriate for easy access to any Portacath or PICC line.   We strive to give you quality time with your provider. You may need to reschedule your appointment if you arrive late (15 or more minutes).  Arriving late affects you and other patients whose appointments are after yours.  Also, if you miss three or more appointments without notifying the office, you may be dismissed from the clinic at the provider's discretion.      For prescription refill requests, have your pharmacy contact our office and allow 72 hours for refills to be completed.    Today you received the following port flush with labs drawn, return as scheduled.   To help prevent nausea and vomiting after your treatment, we encourage you to take your nausea medication as directed.  BELOW ARE SYMPTOMS THAT SHOULD BE REPORTED IMMEDIATELY: *FEVER GREATER THAN 100.4 F (38 C) OR HIGHER *CHILLS OR SWEATING *NAUSEA AND VOMITING THAT IS NOT CONTROLLED WITH YOUR NAUSEA MEDICATION *UNUSUAL SHORTNESS OF BREATH *UNUSUAL BRUISING OR BLEEDING *URINARY PROBLEMS (pain or burning when urinating, or frequent urination) *BOWEL PROBLEMS (unusual diarrhea, constipation, pain near the anus) TENDERNESS IN MOUTH AND THROAT WITH OR WITHOUT PRESENCE OF ULCERS (sore throat, sores in mouth, or a toothache) UNUSUAL RASH, SWELLING OR PAIN  UNUSUAL VAGINAL DISCHARGE OR ITCHING   Items with * indicate a potential emergency and should be followed up as soon as possible or go to the Emergency Department if any problems should occur.  Please show the CHEMOTHERAPY ALERT CARD or IMMUNOTHERAPY ALERT CARD at check-in to the Emergency  Department and triage nurse.  Should you have questions after your visit or need to cancel or reschedule your appointment, please contact [facility] [phone]  and follow the prompts.  Office hours are 8:00 a.m. to 4:30 p.m. Monday - Friday. Please note that voicemails left after 4:00 p.m. may not be returned until the following business day.  We are closed weekends and major holidays. You have access to a nurse at all times for urgent questions. Please call the main number to the clinic [phone] and follow the prompts.  For any non-urgent questions, you may also contact your provider using MyChart. We now offer e-Visits for anyone 28 and older to request care online for non-urgent symptoms. For details visit [email or web address].   Also download the MyChart app! Go to the app store, search "MyChart", open the app, select [facility], and log in with your MyChart username and password.  Masks are optional in the cancer centers. If you would like for your care team to wear a mask while they are taking care of you, please let them know. You may have one support person who is at least 72 years old accompany you for your appointments.

## 2022-02-15 NOTE — Progress Notes (Signed)
Port flushed with good blood return noted, labs drawn. No bruising or swelling at site. Bandaid applied and patient discharged in satisfactory condition. VVS stable with no signs or symptoms of distressed noted.

## 2022-02-22 ENCOUNTER — Telehealth: Payer: Self-pay | Admitting: Family Medicine

## 2022-02-22 ENCOUNTER — Other Ambulatory Visit: Payer: Self-pay | Admitting: Hematology

## 2022-02-22 ENCOUNTER — Inpatient Hospital Stay: Payer: Medicare PPO | Admitting: Hematology

## 2022-02-22 ENCOUNTER — Inpatient Hospital Stay: Payer: Medicare PPO

## 2022-02-22 ENCOUNTER — Other Ambulatory Visit: Payer: Self-pay

## 2022-02-22 DIAGNOSIS — R519 Headache, unspecified: Secondary | ICD-10-CM | POA: Diagnosis not present

## 2022-02-22 DIAGNOSIS — Z95828 Presence of other vascular implants and grafts: Secondary | ICD-10-CM

## 2022-02-22 DIAGNOSIS — I10 Essential (primary) hypertension: Secondary | ICD-10-CM | POA: Diagnosis not present

## 2022-02-22 DIAGNOSIS — C9000 Multiple myeloma not having achieved remission: Secondary | ICD-10-CM

## 2022-02-22 DIAGNOSIS — K7460 Unspecified cirrhosis of liver: Secondary | ICD-10-CM | POA: Diagnosis not present

## 2022-02-22 DIAGNOSIS — Z87442 Personal history of urinary calculi: Secondary | ICD-10-CM | POA: Diagnosis not present

## 2022-02-22 DIAGNOSIS — Z5112 Encounter for antineoplastic immunotherapy: Secondary | ICD-10-CM | POA: Diagnosis not present

## 2022-02-22 DIAGNOSIS — E785 Hyperlipidemia, unspecified: Secondary | ICD-10-CM | POA: Diagnosis not present

## 2022-02-22 DIAGNOSIS — D500 Iron deficiency anemia secondary to blood loss (chronic): Secondary | ICD-10-CM

## 2022-02-22 DIAGNOSIS — D509 Iron deficiency anemia, unspecified: Secondary | ICD-10-CM | POA: Diagnosis not present

## 2022-02-22 DIAGNOSIS — E119 Type 2 diabetes mellitus without complications: Secondary | ICD-10-CM | POA: Diagnosis not present

## 2022-02-22 LAB — CBC WITH DIFFERENTIAL/PLATELET
Abs Immature Granulocytes: 0.02 K/uL (ref 0.00–0.07)
Basophils Absolute: 0 K/uL (ref 0.0–0.1)
Basophils Relative: 0 %
Eosinophils Absolute: 0 K/uL (ref 0.0–0.5)
Eosinophils Relative: 1 %
HCT: 31 % — ABNORMAL LOW (ref 36.0–46.0)
Hemoglobin: 9.5 g/dL — ABNORMAL LOW (ref 12.0–15.0)
Immature Granulocytes: 1 %
Lymphocytes Relative: 17 %
Lymphs Abs: 0.7 K/uL (ref 0.7–4.0)
MCH: 27.3 pg (ref 26.0–34.0)
MCHC: 30.6 g/dL (ref 30.0–36.0)
MCV: 89.1 fL (ref 80.0–100.0)
Monocytes Absolute: 0.2 K/uL (ref 0.1–1.0)
Monocytes Relative: 5 %
Neutro Abs: 3.2 K/uL (ref 1.7–7.7)
Neutrophils Relative %: 76 %
Platelets: 137 K/uL — ABNORMAL LOW (ref 150–400)
RBC: 3.48 MIL/uL — ABNORMAL LOW (ref 3.87–5.11)
RDW: 17.2 % — ABNORMAL HIGH (ref 11.5–15.5)
WBC: 4.2 K/uL (ref 4.0–10.5)
nRBC: 0 % (ref 0.0–0.2)

## 2022-02-22 LAB — SAMPLE TO BLOOD BANK

## 2022-02-22 MED ORDER — SODIUM CHLORIDE 0.9% FLUSH
10.0000 mL | Freq: Once | INTRAVENOUS | Status: AC
  Administered 2022-02-22: 10 mL via INTRAVENOUS

## 2022-02-22 MED ORDER — DARATUMUMAB-HYALURONIDASE-FIHJ 1800-30000 MG-UT/15ML ~~LOC~~ SOLN
1800.0000 mg | Freq: Once | SUBCUTANEOUS | Status: AC
  Administered 2022-02-22: 1800 mg via SUBCUTANEOUS
  Filled 2022-02-22: qty 15

## 2022-02-22 MED ORDER — FERROUS SULFATE 325 (65 FE) MG PO TABS: 325.0000 mg | ORAL_TABLET | Freq: Every day | ORAL | 3 refills | Status: DC

## 2022-02-22 MED ORDER — HEPARIN SOD (PORK) LOCK FLUSH 100 UNIT/ML IV SOLN
500.0000 [IU] | Freq: Once | INTRAVENOUS | Status: AC
  Administered 2022-02-22: 500 [IU] via INTRAVENOUS

## 2022-02-22 MED ORDER — SODIUM CHLORIDE 0.9% FLUSH
10.0000 mL | INTRAVENOUS | Status: DC | PRN
  Administered 2022-02-22: 10 mL via INTRAVENOUS

## 2022-02-22 NOTE — Progress Notes (Signed)
Patient reports taking pre-medications at home. Patient tolerated Daratumumab injection with no complaints voiced. See MAR for details. Lab reviewed. Injection site clean and dry with no bruising or swelling noted at site. Band aid applied.   Port flushed with good blood return noted. No bruising or swelling at site. Bandaid applied and patient discharged in satisfactory condition. VVS stable with no signs or symptoms of distressed noted.

## 2022-02-22 NOTE — Telephone Encounter (Signed)
Patient would like to make an appointment with Dr. Lajuana Ripple. She needs to discuss Ozempic, being referred to a different heart doctor and an issue that she is having with her feet. No open appointments at this time. Please call back.

## 2022-02-22 NOTE — Progress Notes (Signed)
Patients port flushed without difficulty.  Good blood return noted with no bruising or swelling noted at site.  Patient remains accessed for any treatment.

## 2022-02-22 NOTE — Telephone Encounter (Signed)
Lmtcb.

## 2022-02-22 NOTE — Patient Instructions (Signed)
[facility]  Discharge Instructions: Thank you for choosing [facility] to provide your oncology and hematology care.  If you have a lab appointment with the [facility], please come in thru the Main Entrance and check in at the main information desk.  Wear comfortable clothing and clothing appropriate for easy access to any Portacath or PICC line.   We strive to give you quality time with your provider. You may need to reschedule your appointment if you arrive late (15 or more minutes).  Arriving late affects you and other patients whose appointments are after yours.  Also, if you miss three or more appointments without notifying the office, you may be dismissed from the clinic at the provider's discretion.      For prescription refill requests, have your pharmacy contact our office and allow 72 hours for refills to be completed.    Today you received the following chemotherapy and/or immunotherapy agents Daratumumab, return as scheduled.   To help prevent nausea and vomiting after your treatment, we encourage you to take your nausea medication as directed.  BELOW ARE SYMPTOMS THAT SHOULD BE REPORTED IMMEDIATELY: *FEVER GREATER THAN 100.4 F (38 C) OR HIGHER *CHILLS OR SWEATING *NAUSEA AND VOMITING THAT IS NOT CONTROLLED WITH YOUR NAUSEA MEDICATION *UNUSUAL SHORTNESS OF BREATH *UNUSUAL BRUISING OR BLEEDING *URINARY PROBLEMS (pain or burning when urinating, or frequent urination) *BOWEL PROBLEMS (unusual diarrhea, constipation, pain near the anus) TENDERNESS IN MOUTH AND THROAT WITH OR WITHOUT PRESENCE OF ULCERS (sore throat, sores in mouth, or a toothache) UNUSUAL RASH, SWELLING OR PAIN  UNUSUAL VAGINAL DISCHARGE OR ITCHING   Items with * indicate a potential emergency and should be followed up as soon as possible or go to the Emergency Department if any problems should occur.  Please show the CHEMOTHERAPY ALERT CARD or IMMUNOTHERAPY ALERT CARD at  check-in to the Emergency Department and triage nurse.  Should you have questions after your visit or need to cancel or reschedule your appointment, please contact [facility] [phone]  and follow the prompts.  Office hours are 8:00 a.m. to 4:30 p.m. Monday - Friday. Please note that voicemails left after 4:00 p.m. may not be returned until the following business day.  We are closed weekends and major holidays. You have access to a nurse at all times for urgent questions. Please call the main number to the clinic [phone] and follow the prompts.  For any non-urgent questions, you may also contact your provider using MyChart. We now offer e-Visits for anyone 64 and older to request care online for non-urgent symptoms. For details visit [email or web address].   Also download the MyChart app! Go to the app store, search "MyChart", open the app, select [facility], and log in with your MyChart username and password.  Masks are optional in the cancer centers. If you would like for your care team to wear a mask while they are taking care of you, please let them know. You may have one support person who is at least 72 years old accompany you for your appointments.

## 2022-02-23 ENCOUNTER — Other Ambulatory Visit: Payer: Self-pay

## 2022-02-23 DIAGNOSIS — C9000 Multiple myeloma not having achieved remission: Secondary | ICD-10-CM

## 2022-02-23 MED ORDER — LENALIDOMIDE 15 MG PO CAPS: ORAL_CAPSULE | ORAL | 0 refills | Status: DC

## 2022-02-23 NOTE — Telephone Encounter (Signed)
Scheduled first avaiable

## 2022-02-23 NOTE — Telephone Encounter (Signed)
Chart reviewed. Revlimid refilled per last office note with Dr. Delton Coombes.

## 2022-02-25 ENCOUNTER — Ambulatory Visit: Payer: Medicare PPO | Admitting: Family Medicine

## 2022-02-25 ENCOUNTER — Encounter: Payer: Self-pay | Admitting: Family Medicine

## 2022-02-25 VITALS — BP 126/46 | HR 81 | Temp 97.2°F | Ht 62.0 in | Wt 295.4 lb

## 2022-02-25 DIAGNOSIS — F339 Major depressive disorder, recurrent, unspecified: Secondary | ICD-10-CM

## 2022-02-25 DIAGNOSIS — Z9981 Dependence on supplemental oxygen: Secondary | ICD-10-CM

## 2022-02-25 DIAGNOSIS — E1169 Type 2 diabetes mellitus with other specified complication: Secondary | ICD-10-CM | POA: Diagnosis not present

## 2022-02-25 DIAGNOSIS — C9000 Multiple myeloma not having achieved remission: Secondary | ICD-10-CM

## 2022-02-25 DIAGNOSIS — E1142 Type 2 diabetes mellitus with diabetic polyneuropathy: Secondary | ICD-10-CM | POA: Diagnosis not present

## 2022-02-25 DIAGNOSIS — R0902 Hypoxemia: Secondary | ICD-10-CM | POA: Diagnosis not present

## 2022-02-25 LAB — BAYER DCA HB A1C WAIVED: HB A1C (BAYER DCA - WAIVED): 6.5 % — ABNORMAL HIGH (ref 4.8–5.6)

## 2022-02-25 MED ORDER — DESVENLAFAXINE SUCCINATE ER 100 MG PO TB24: 100.0000 mg | ORAL_TABLET | Freq: Every day | ORAL | 3 refills | Status: DC

## 2022-02-25 NOTE — Patient Instructions (Signed)
Alpha lipoic acid 630m ONCE daily for neuropathy. If no improvement after 3 weeks, discontinue Will try and get an appeal for that concentrator.

## 2022-02-25 NOTE — Progress Notes (Signed)
Subjective: CC: Multiple concerns PCP: [name], DO EXH:[id number] Savannah Burns is a 71 y.o. female presenting to clinic today for:  1.  Neuropathy Patient reports that she has a sensation of "stepping on clouds" on the bottoms of her feet.  She denies any overt pain they just feel swollen even if they are not swollen.  She does suffer from type 2 diabetes and has been compliant with medications except for her diuretic, which she takes at least once daily but often not twice daily.  She notes that this is because she has frequent urination if she does take it and does not want to have accidents while she is out and about.  2.  Hypoxia requiring supplemental oxygen Patient is currently under the care of [facility] for this.  She tried to get a portable oxygen concentrator but apparently her insurance declined it.  She is wondering if this is something we might be able to appeal because she cannot bring around a very heavy canister due to physical impairment and shortness of breath.  3.  Depression and anxiety Continues to suffer from depression and anxiety.  Would like to advance the Pristiq.  Currently undergoing treatment for multiple myeloma with Dr. Delton Coombes.   ROS: Per HPI  Allergies  Allergen Reactions   Keflex [Cephalexin] Nausea And Vomiting   Past Medical History:  Diagnosis Date   Adrenal adenoma, left    Stable   Anxiety    Arthritis    bilateral hands   Depression    Diabetes mellitus, type 2 ([facility]) 08/12/2008   Qualifier: Diagnosis of  By: Deborra Medina MD, Talia     Dyspnea    Esophageal varices (HCC)    Grade II diastolic dysfunction    History of kidney stones    Hyperlipidemia    Hypertension    Lower back pain    Lower GI bleed 03/19/2020   Panic attacks    Pneumonia    currently taking antibiotic and prednisone for early stages of pneumonia   Pulmonary nodules    bilateral   Skin cancer    face    Current Outpatient Medications:    acyclovir (ZOVIRAX)  400 MG tablet, Take 1 tablet (400 mg total) by mouth 2 (two) times daily., Disp: 60 tablet, Rfl: 6   Blood Glucose Calibration (TRUE METRIX LEVEL 1) Low SOLN, Use w/ glucose monitor Dx E11.9, Disp: 3 each, Rfl: 0   Blood Glucose Monitoring Suppl (TRUE METRIX AIR GLUCOSE METER) w/Device KIT, Check BS daily Dx E11.9, Disp: 1 kit, Rfl: 0   carvedilol (COREG) 6.25 MG tablet, Take 6.25 mg by mouth 2 (two) times daily., Disp: , Rfl:    Cholecalciferol (VITAMIN D) 50 MCG (2000 UT) tablet, Take 2,000 Units by mouth daily., Disp: , Rfl:    clobetasol (TEMOVATE) 0.05 % external solution, Apply topically., Disp: , Rfl:    CONSTULOSE 10 GM/15ML solution, Take by mouth., Disp: , Rfl:    Cyanocobalamin (B-12 COMPLIANCE INJECTION) 1000 MCG/ML KIT, Inject 1,000 mcg as directed every 30 (thirty) days., Disp: , Rfl:    Cyanocobalamin (VITAMIN B-12 IJ), Inject 1,000 mcg as directed every 30 (thirty) days., Disp: , Rfl:    daratumumab-hyaluronidase-fihj (DARZALEX FASPRO) 1800-30000 MG-UT/15ML SOLN, Inject 1,800 mg into the skin once a week., Disp: , Rfl:    desvenlafaxine (PRISTIQ) 50 MG 24 hr tablet, Take 1 tablet (50 mg total) by mouth daily., Disp: 90 tablet, Rfl: 3   dexamethasone (DECADRON) 4 MG tablet, Take  5 tablets (20 mg total) by mouth once a week., Disp: 20 tablet, Rfl: 3   esomeprazole (NEXIUM) 20 MG capsule, Take 1 capsule (20 mg total) by mouth daily at 12 noon., Disp: 90 capsule, Rfl: 3   ferrous sulfate 325 (65 FE) MG tablet, Take 1 tablet (325 mg total) by mouth daily with breakfast., Disp: 30 tablet, Rfl: 3   fluticasone (CUTIVATE) 0.05 % cream, Apply 1 Application topically daily., Disp: , Rfl:    furosemide (LASIX) 20 MG tablet, Take 20 mg by mouth. bid, Disp: , Rfl:    glucose blood (TRUE METRIX BLOOD GLUCOSE TEST) test strip, Check BS daily Dx E11.9, Disp: 100 each, Rfl: 3   lenalidomide (REVLIMID) 15 MG capsule, Take one capsule daily for 21 days on, 7 days off., Disp: 21 capsule, Rfl: 0    lidocaine-prilocaine (EMLA) cream, Apply 1 Application topically as needed. Apply a small amount to port a cath site and cover with plastic wrap 1 hour prior to infusion appointments, Disp: 30 g, Rfl: 3   lovastatin (MEVACOR) 20 MG tablet, Take 1 tablet (20 mg total) by mouth at bedtime., Disp: 90 tablet, Rfl: 3   omeprazole (PRILOSEC) 20 MG capsule, Take 20 mg by mouth 2 (two) times daily., Disp: , Rfl:    oxyCODONE-acetaminophen (PERCOCET) 10-325 MG tablet, Take 1 tablet by mouth every 6 (six) hours as needed for pain., Disp: , Rfl:    OXYGEN, Inhale 3 L into the lungs continuous., Disp: , Rfl:    OZEMPIC, 0.25 OR 0.5 MG/DOSE, 2 MG/3ML SOPN, Inject into the skin., Disp: , Rfl:    potassium chloride SA (KLOR-CON) 20 MEQ tablet, Take 2 tablets (40 mEq total) by mouth 2 (two) times daily. (Patient taking differently: Take 40 mEq by mouth daily.), Disp: 360 tablet, Rfl: 3   prochlorperazine (COMPAZINE) 10 MG tablet, Take 1 tablet (10 mg total) by mouth every 6 (six) hours as needed for nausea or vomiting., Disp: 30 tablet, Rfl: 6   TRUEplus Lancets 33G MISC, Check BS daily Dx E11.9, Disp: 100 each, Rfl: 3   torsemide (DEMADEX) 20 MG tablet, Take 3 tablets (60 mg total) by mouth 2 (two) times daily. (Patient taking differently: Take 50 mg by mouth 2 (two) times daily.), Disp: 180 tablet, Rfl: 11 Social History   Socioeconomic History   Marital status: Widowed    Spouse name: Not on file   Number of children: 2   Years of education: 14   Highest education level: Not on file  Occupational History   Occupation: Retired   Tobacco Use   Smoking status: Former    Packs/day: 1.50    Years: 40.00    Total pack years: 60.00    Types: Cigarettes    Quit date: 04/29/2015    Years since quitting: 6.8   Smokeless tobacco: Never   Tobacco comments:    Quit smoking 04/2015- Previous 1.5 ppd smoker  Vaping Use   Vaping Use: Never used  Substance and Sexual Activity   Alcohol use: No    Alcohol/week: 0.0  standard drinks of alcohol   Drug use: No   Sexual activity: Not Currently    Birth control/protection: Post-menopausal  Other Topics Concern   Not on file  Social History Narrative   Her 21 year old granddaughter lives with her - one daughter lives nearby, but she doesn't have a good relationship with her. Has a great relationship with other daughter who lives 1.5 hrs away - talks to  her daily on the phone.   Social Determinants of Health   Financial Resource Strain: Medium Risk (07/27/2021)   Overall Financial Resource Strain (CARDIA)    Difficulty of Paying Living Expenses: Somewhat hard  Food Insecurity: Food Insecurity Present (07/27/2021)   Hunger Vital Sign    Worried About Running Out of Food in the Last Year: Sometimes true    Ran Out of Food in the Last Year: Never true  Transportation Needs: No Transportation Needs (07/27/2021)   PRAPARE - Hydrologist (Medical): No    Lack of Transportation (Non-Medical): No  Physical Activity: Inactive (07/27/2021)   Exercise Vital Sign    Days of Exercise per Week: 0 days    Minutes of Exercise per Session: 0 min  Stress: Stress Concern Present (07/27/2021)   [place]    Feeling of Stress : To some extent  Social Connections: Socially Isolated (07/27/2021)   Social Connection and Isolation Panel [NHANES]    Frequency of Communication with Friends and Family: More than three times a week    Frequency of Social Gatherings with Friends and Family: Once a week    Attends Religious Services: Never    Marine scientist or Organizations: No    Attends Archivist Meetings: Never    Marital Status: Widowed  Intimate Partner Violence: Not At Risk (07/27/2021)   Humiliation, Afraid, Rape, and Kick questionnaire    Fear of Current or Ex-Partner: No    Emotionally Abused: No    Physically Abused: No    Sexually Abused: No   Family History   Problem Relation Age of Onset   Diabetes Father    Heart disease Father 69       MI   Hypertension Father    Anemia Mother        Transfusion dependent   COPD Sister    Cancer Paternal Grandmother 59       Pancreatic    Objective: Office vital signs reviewed. BP (!) 126/46   Pulse 81   Temp (!) 97.2 F (36.2 C)   Ht 5' 2" (1.575 m)   Wt 295 lb 6.4 oz (134 kg)   SpO2 95%   BMI 54.03 kg/m   Physical Examination:  General: Awake, alert, super morbidly obese, No acute distress HEENT: sclera white, MMM Cardio: regular rate and rhythm, S1S2 heard, no murmurs appreciated Pulm: clear to auscultation bilaterally, no wheezes, rhonchi or rales; normal work of breathing on 2 L of oxygen via nasal cannula MSK: Ambulates with use of walker    02/25/2022    1:43 PM 10/19/2021   12:12 PM 08/12/2021    1:34 PM  Depression screen PHQ 2/9  Decreased Interest 2 0 2  Down, Depressed, Hopeless 1 0 1  PHQ - 2 Score 3 0 3  Altered sleeping 1  2  Tired, decreased energy 2  2  Change in appetite 0  0  Feeling bad or failure about yourself  1  1  Trouble concentrating 2  1  Moving slowly or fidgety/restless 0  0  Suicidal thoughts 0  0  PHQ-9 Score 9  9  Difficult doing work/chores Very difficult  Somewhat difficult      02/25/2022    1:43 PM 08/12/2021    1:41 PM 05/19/2021   11:36 AM 04/14/2021    2:33 PM  GAD 7 : Generalized Anxiety Score  Nervous, Anxious, on  Edge _0 Control/stop worrying 1 0 2 0  Worry too much - different things _1 0  Trouble relaxing 1 0 1 1  Restless 0 0 0 0  Easily annoyed or irritable _2 0  Afraid - awful might happen 1 0 0 0  Total GAD 7 Score _3 Anxiety Difficulty Somewhat difficult Somewhat difficult Somewhat difficult Somewhat difficult   Assessment/ Plan: 71 y.o. female   Diabetic peripheral neuropathy associated with type 2 diabetes mellitus ([facility])  Hypoxia - Plan: For home use only DME oxygen  Oxygen dependent - Plan: For  home use only DME oxygen  Type 2 diabetes mellitus with other specified complication, without long-term current use of insulin (HCC) - Plan: Fructosamine, Bayer DCA Hb A1c Waived  Multiple myeloma not having achieved remission (HCC)  Depression, recurrent ([facility]) - Plan: desvenlafaxine (PRISTIQ) 100 MG 24 hr tablet  Alpha lipoic acid 600 mg daily recommended.  We discussed that neuropathy was not uncommon for diabetics.  Likely this is not a painful neuropathic process.  I have completed a new order for portable oxygen concentrator.  We will send to a new supply provider as her current insurance does not cover her current supplier.  A1c is normal and she has not required a transfusion recently but we will still check for toes to mean levels since she has had abnormalities in the past  Continue to follow-up with Dr. Delton Coombes.  Inquire as to whether or not he wants to continue the Decadron as she seems confused about whether or not she should still be on that medication  Pristiq was advanced to 100 mg daily.  Follow-up in 2 to 3 months, sooner if concerns arise  No orders of the defined types were placed in this encounter.  No orders of the defined types were placed in this encounter.    [name], DO [name] [phone]

## 2022-02-27 LAB — FRUCTOSAMINE: Fructosamine: 212 umol/L (ref 0–285)

## 2022-02-28 NOTE — Progress Notes (Signed)
Oxygen order faxed to [name] Fax# [phone]

## 2022-03-01 ENCOUNTER — Inpatient Hospital Stay: Payer: Medicare PPO | Attending: Hematology

## 2022-03-01 DIAGNOSIS — E785 Hyperlipidemia, unspecified: Secondary | ICD-10-CM | POA: Diagnosis not present

## 2022-03-01 DIAGNOSIS — Z87891 Personal history of nicotine dependence: Secondary | ICD-10-CM | POA: Insufficient documentation

## 2022-03-01 DIAGNOSIS — Z5112 Encounter for antineoplastic immunotherapy: Secondary | ICD-10-CM | POA: Diagnosis not present

## 2022-03-01 DIAGNOSIS — R519 Headache, unspecified: Secondary | ICD-10-CM | POA: Insufficient documentation

## 2022-03-01 DIAGNOSIS — K7460 Unspecified cirrhosis of liver: Secondary | ICD-10-CM | POA: Insufficient documentation

## 2022-03-01 DIAGNOSIS — Z87442 Personal history of urinary calculi: Secondary | ICD-10-CM | POA: Insufficient documentation

## 2022-03-01 DIAGNOSIS — R232 Flushing: Secondary | ICD-10-CM | POA: Insufficient documentation

## 2022-03-01 DIAGNOSIS — Z79624 Long term (current) use of inhibitors of nucleotide synthesis: Secondary | ICD-10-CM | POA: Insufficient documentation

## 2022-03-01 DIAGNOSIS — Z8041 Family history of malignant neoplasm of ovary: Secondary | ICD-10-CM | POA: Insufficient documentation

## 2022-03-01 DIAGNOSIS — C9000 Multiple myeloma not having achieved remission: Secondary | ICD-10-CM | POA: Insufficient documentation

## 2022-03-01 DIAGNOSIS — D500 Iron deficiency anemia secondary to blood loss (chronic): Secondary | ICD-10-CM | POA: Insufficient documentation

## 2022-03-01 DIAGNOSIS — I10 Essential (primary) hypertension: Secondary | ICD-10-CM | POA: Diagnosis not present

## 2022-03-01 DIAGNOSIS — K921 Melena: Secondary | ICD-10-CM | POA: Insufficient documentation

## 2022-03-01 DIAGNOSIS — E119 Type 2 diabetes mellitus without complications: Secondary | ICD-10-CM | POA: Diagnosis not present

## 2022-03-01 DIAGNOSIS — R0609 Other forms of dyspnea: Secondary | ICD-10-CM | POA: Diagnosis not present

## 2022-03-01 DIAGNOSIS — Z7961 Long term (current) use of immunomodulator: Secondary | ICD-10-CM | POA: Diagnosis not present

## 2022-03-01 LAB — CBC WITH DIFFERENTIAL/PLATELET
Abs Immature Granulocytes: 0.01 10*3/uL (ref 0.00–0.07)
Basophils Absolute: 0 10*3/uL (ref 0.0–0.1)
Basophils Relative: 0 %
Eosinophils Absolute: 0.1 10*3/uL (ref 0.0–0.5)
Eosinophils Relative: 2 %
HCT: 32.2 % — ABNORMAL LOW (ref 36.0–46.0)
Hemoglobin: 9.8 g/dL — ABNORMAL LOW (ref 12.0–15.0)
Immature Granulocytes: 0 %
Lymphocytes Relative: 27 %
Lymphs Abs: 1 10*3/uL (ref 0.7–4.0)
MCH: 26.9 pg (ref 26.0–34.0)
MCHC: 30.4 g/dL (ref 30.0–36.0)
MCV: 88.5 fL (ref 80.0–100.0)
Monocytes Absolute: 0.5 10*3/uL (ref 0.1–1.0)
Monocytes Relative: 14 %
Neutro Abs: 2 10*3/uL (ref 1.7–7.7)
Neutrophils Relative %: 57 %
Platelets: 128 10*3/uL — ABNORMAL LOW (ref 150–400)
RBC: 3.64 MIL/uL — ABNORMAL LOW (ref 3.87–5.11)
RDW: 17.2 % — ABNORMAL HIGH (ref 11.5–15.5)
WBC: 3.6 10*3/uL — ABNORMAL LOW (ref 4.0–10.5)
nRBC: 0 % (ref 0.0–0.2)

## 2022-03-01 LAB — COMPREHENSIVE METABOLIC PANEL
ALT: 15 U/L (ref 0–44)
AST: 16 U/L (ref 15–41)
Albumin: 3.1 g/dL — ABNORMAL LOW (ref 3.5–5.0)
Alkaline Phosphatase: 90 U/L (ref 38–126)
Anion gap: 9 (ref 5–15)
BUN: 17 mg/dL (ref 8–23)
CO2: 28 mmol/L (ref 22–32)
Calcium: 8.5 mg/dL — ABNORMAL LOW (ref 8.9–10.3)
Chloride: 102 mmol/L (ref 98–111)
Creatinine, Ser: 0.62 mg/dL (ref 0.44–1.00)
GFR, Estimated: >60 mL/min (ref >60)
Glucose, Bld: 211 mg/dL — ABNORMAL HIGH (ref 70–99)
Potassium: 3.4 mmol/L — ABNORMAL LOW (ref 3.5–5.1)
Sodium: 139 mmol/L (ref 135–145)
Total Bilirubin: 0.2 mg/dL — ABNORMAL LOW (ref 0.3–1.2)
Total Protein: 6 g/dL — ABNORMAL LOW (ref 6.5–8.1)

## 2022-03-01 LAB — IRON AND TIBC
Iron: 24 ug/dL — ABNORMAL LOW (ref 28–170)
Saturation Ratios: 5 % — ABNORMAL LOW (ref 10.4–31.8)
TIBC: 438 ug/dL (ref 250–450)
UIBC: 414 ug/dL

## 2022-03-01 LAB — FERRITIN: Ferritin: 7 ng/mL — ABNORMAL LOW (ref 11–307)

## 2022-03-01 LAB — MAGNESIUM: Magnesium: 1.9 mg/dL (ref 1.7–2.4)

## 2022-03-01 MED ORDER — SODIUM CHLORIDE 0.9% FLUSH
10.0000 mL | INTRAVENOUS | Status: AC
  Administered 2022-03-01: 10 mL via INTRAVENOUS

## 2022-03-01 MED ORDER — HEPARIN SOD (PORK) LOCK FLUSH 100 UNIT/ML IV SOLN
500.0000 [IU] | Freq: Once | INTRAVENOUS | Status: AC
  Administered 2022-03-01: 500 [IU] via INTRAVENOUS

## 2022-03-01 NOTE — Progress Notes (Signed)
Savannah Burns presented for Portacath access and flush.  Portacath located right chest wall accessed with  H 20 needle.  Good blood return present. Portacath flushed with 93m NS and 500U/549mHeparin and needle removed intact.  Procedure tolerated well and without incident. No complaints at this time. Discharged from clinic by wheel chair in stable condition. Alert and oriented x 3. [facility]

## 2022-03-02 ENCOUNTER — Telehealth: Payer: Self-pay | Admitting: Family Medicine

## 2022-03-02 LAB — KAPPA/LAMBDA LIGHT CHAINS
Kappa free light chain: 25.3 mg/L — ABNORMAL HIGH (ref 3.3–19.4)
Kappa, lambda light chain ratio: 4.69 — ABNORMAL HIGH (ref 0.26–1.65)
Lambda free light chains: 5.4 mg/L — ABNORMAL LOW (ref 5.7–26.3)

## 2022-03-02 NOTE — Telephone Encounter (Signed)
Patient aware.

## 2022-03-02 NOTE — Telephone Encounter (Signed)
Patient was seen in 12/1 and said she spoke with PCP about getting a medicine for neuropathy in her feet. The pharmacy has not received. She was not sure of the name of the medication. Please call back and advise.

## 2022-03-02 NOTE — Telephone Encounter (Signed)
This was written in VERY large print on her AVS.  She can refer to it if needed but it is Alpha Lipoic acid 626m to take once daily.  It is over the counter.

## 2022-03-03 DIAGNOSIS — G894 Chronic pain syndrome: Secondary | ICD-10-CM | POA: Diagnosis not present

## 2022-03-03 DIAGNOSIS — C9000 Multiple myeloma not having achieved remission: Secondary | ICD-10-CM | POA: Diagnosis not present

## 2022-03-03 DIAGNOSIS — Z79899 Other long term (current) drug therapy: Secondary | ICD-10-CM | POA: Diagnosis not present

## 2022-03-03 DIAGNOSIS — M5136 Other intervertebral disc degeneration, lumbar region: Secondary | ICD-10-CM | POA: Diagnosis not present

## 2022-03-03 DIAGNOSIS — E119 Type 2 diabetes mellitus without complications: Secondary | ICD-10-CM | POA: Diagnosis not present

## 2022-03-03 DIAGNOSIS — R030 Elevated blood-pressure reading, without diagnosis of hypertension: Secondary | ICD-10-CM | POA: Diagnosis not present

## 2022-03-03 DIAGNOSIS — Z6841 Body Mass Index (BMI) 40.0 and over, adult: Secondary | ICD-10-CM | POA: Diagnosis not present

## 2022-03-03 DIAGNOSIS — F1120 Opioid dependence, uncomplicated: Secondary | ICD-10-CM | POA: Diagnosis not present

## 2022-03-03 LAB — PROTEIN ELECTROPHORESIS, SERUM
A/G Ratio: 1.3 (ref 0.7–1.7)
Albumin ELP: 3.2 g/dL (ref 2.9–4.4)
Alpha-1-Globulin: 0.2 g/dL (ref 0.0–0.4)
Alpha-2-Globulin: 0.6 g/dL (ref 0.4–1.0)
Beta Globulin: 0.8 g/dL (ref 0.7–1.3)
Gamma Globulin: 0.7 g/dL (ref 0.4–1.8)
Globulin, Total: 2.4 g/dL (ref 2.2–3.9)
M-Spike, %: 0.4 g/dL — ABNORMAL HIGH
Total Protein ELP: 5.6 g/dL — ABNORMAL LOW (ref 6.0–8.5)

## 2022-03-08 ENCOUNTER — Inpatient Hospital Stay: Payer: Medicare PPO

## 2022-03-08 ENCOUNTER — Encounter: Payer: Self-pay | Admitting: Hematology

## 2022-03-08 ENCOUNTER — Inpatient Hospital Stay (HOSPITAL_BASED_OUTPATIENT_CLINIC_OR_DEPARTMENT_OTHER): Payer: Medicare PPO | Admitting: Hematology

## 2022-03-08 VITALS — BP 140/57 | HR 77 | Temp 97.5°F | Resp 20

## 2022-03-08 DIAGNOSIS — R0609 Other forms of dyspnea: Secondary | ICD-10-CM | POA: Diagnosis not present

## 2022-03-08 DIAGNOSIS — C9000 Multiple myeloma not having achieved remission: Secondary | ICD-10-CM | POA: Diagnosis not present

## 2022-03-08 DIAGNOSIS — D500 Iron deficiency anemia secondary to blood loss (chronic): Secondary | ICD-10-CM | POA: Diagnosis not present

## 2022-03-08 DIAGNOSIS — I10 Essential (primary) hypertension: Secondary | ICD-10-CM | POA: Diagnosis not present

## 2022-03-08 DIAGNOSIS — K921 Melena: Secondary | ICD-10-CM | POA: Diagnosis not present

## 2022-03-08 DIAGNOSIS — E119 Type 2 diabetes mellitus without complications: Secondary | ICD-10-CM | POA: Diagnosis not present

## 2022-03-08 DIAGNOSIS — R519 Headache, unspecified: Secondary | ICD-10-CM | POA: Diagnosis not present

## 2022-03-08 DIAGNOSIS — Z5112 Encounter for antineoplastic immunotherapy: Secondary | ICD-10-CM | POA: Diagnosis not present

## 2022-03-08 DIAGNOSIS — E785 Hyperlipidemia, unspecified: Secondary | ICD-10-CM | POA: Diagnosis not present

## 2022-03-08 LAB — COMPREHENSIVE METABOLIC PANEL
ALT: 19 U/L (ref 0–44)
AST: 17 U/L (ref 15–41)
Albumin: 3.1 g/dL — ABNORMAL LOW (ref 3.5–5.0)
Alkaline Phosphatase: 97 U/L (ref 38–126)
Anion gap: 6 (ref 5–15)
BUN: 16 mg/dL (ref 8–23)
CO2: 27 mmol/L (ref 22–32)
Calcium: 8.5 mg/dL — ABNORMAL LOW (ref 8.9–10.3)
Chloride: 105 mmol/L (ref 98–111)
Creatinine, Ser: 0.57 mg/dL (ref 0.44–1.00)
GFR, Estimated: >60 mL/min (ref >60)
Glucose, Bld: 159 mg/dL — ABNORMAL HIGH (ref 70–99)
Potassium: 4.2 mmol/L (ref 3.5–5.1)
Sodium: 138 mmol/L (ref 135–145)
Total Bilirubin: 0.2 mg/dL — ABNORMAL LOW (ref 0.3–1.2)
Total Protein: 5.9 g/dL — ABNORMAL LOW (ref 6.5–8.1)

## 2022-03-08 LAB — CBC WITH DIFFERENTIAL/PLATELET
Abs Immature Granulocytes: 0.02 10*3/uL (ref 0.00–0.07)
Basophils Absolute: 0 10*3/uL (ref 0.0–0.1)
Basophils Relative: 0 %
Eosinophils Absolute: 0.1 10*3/uL (ref 0.0–0.5)
Eosinophils Relative: 3 %
HCT: 30.8 % — ABNORMAL LOW (ref 36.0–46.0)
Hemoglobin: 9.2 g/dL — ABNORMAL LOW (ref 12.0–15.0)
Immature Granulocytes: 1 %
Lymphocytes Relative: 27 %
Lymphs Abs: 0.9 10*3/uL (ref 0.7–4.0)
MCH: 26.6 pg (ref 26.0–34.0)
MCHC: 29.9 g/dL — ABNORMAL LOW (ref 30.0–36.0)
MCV: 89 fL (ref 80.0–100.0)
Monocytes Absolute: 0.3 10*3/uL (ref 0.1–1.0)
Monocytes Relative: 9 %
Neutro Abs: 2 10*3/uL (ref 1.7–7.7)
Neutrophils Relative %: 60 %
Platelets: 132 10*3/uL — ABNORMAL LOW (ref 150–400)
RBC: 3.46 MIL/uL — ABNORMAL LOW (ref 3.87–5.11)
RDW: 16.9 % — ABNORMAL HIGH (ref 11.5–15.5)
WBC: 3.4 10*3/uL — ABNORMAL LOW (ref 4.0–10.5)
nRBC: 0 % (ref 0.0–0.2)

## 2022-03-08 LAB — MAGNESIUM: Magnesium: 1.8 mg/dL (ref 1.7–2.4)

## 2022-03-08 MED ORDER — SODIUM CHLORIDE 0.9 % IV SOLN: Freq: Once | INTRAVENOUS | Status: AC

## 2022-03-08 MED ORDER — SODIUM CHLORIDE 0.9% FLUSH
10.0000 mL | INTRAVENOUS | Status: DC | PRN
  Administered 2022-03-08: 10 mL via INTRAVENOUS

## 2022-03-08 MED ORDER — HEPARIN SOD (PORK) LOCK FLUSH 100 UNIT/ML IV SOLN
500.0000 [IU] | Freq: Once | INTRAVENOUS | Status: AC
  Administered 2022-03-08: 500 [IU] via INTRAVENOUS

## 2022-03-08 MED ORDER — SODIUM CHLORIDE 0.9 % IV SOLN
300.0000 mg | Freq: Once | INTRAVENOUS | Status: AC
  Administered 2022-03-08: 300 mg via INTRAVENOUS
  Filled 2022-03-08: qty 300

## 2022-03-08 MED ORDER — SODIUM CHLORIDE 0.9% FLUSH
10.0000 mL | Freq: Once | INTRAVENOUS | Status: AC
  Administered 2022-03-08: 10 mL via INTRAVENOUS

## 2022-03-08 MED ORDER — CYANOCOBALAMIN 1000 MCG/ML IJ SOLN
1000.0000 ug | Freq: Once | INTRAMUSCULAR | Status: AC
  Administered 2022-03-08: 1000 ug via INTRAMUSCULAR
  Filled 2022-03-08: qty 1

## 2022-03-08 MED ORDER — DARATUMUMAB-HYALURONIDASE-FIHJ 1800-30000 MG-UT/15ML ~~LOC~~ SOLN
1800.0000 mg | Freq: Once | SUBCUTANEOUS | Status: AC
  Administered 2022-03-08: 1800 mg via SUBCUTANEOUS
  Filled 2022-03-08: qty 15

## 2022-03-08 NOTE — Progress Notes (Signed)
Patients port flushed without difficulty.  Good blood return noted with no bruising or swelling noted at site.  Stable during access and blood draw.  Patient to remain accessed for possible infusion.

## 2022-03-08 NOTE — Progress Notes (Unsigned)
Labs reviewed at office visit with MD. Madaline Brilliant to treat today and added an iron infusion on for today per MD.   Patient took all pre-meds at home including an allergy pill.

## 2022-03-08 NOTE — Progress Notes (Signed)
Patient is taking Revlimid as prescribed.  She has not missed any doses and reports no side effects at this time.    Patient has been examined by Dr. Delton Coombes, and vital signs and labs have been reviewed. ANC, Creatinine, LFTs, hemoglobin, and platelets are within treatment parameters per M.D. - pt may proceed with treatment.  Primary RN and pharmacy notified.

## 2022-03-08 NOTE — Patient Instructions (Addendum)
[facility] at [facility] Discharge Instructions   You were seen and examined today by Dr. Delton Coombes.  He reviewed the results of your lab work. Your iron is low. We will arrange for you to have iron infusions x 3. All other results are normal/stable. Your myeloma labs are also stable.   We will proceed with your treatment today.   Return as scheduled.    Thank you for choosing [facility] at [facility] to provide your oncology and hematology care.  To afford each patient quality time with our provider, please arrive at least 15 minutes before your scheduled appointment time.   If you have a lab appointment with the [facility] please come in thru the Main Entrance and check in at the main information desk.  You need to re-schedule your appointment should you arrive 10 or more minutes late.  We strive to give you quality time with our providers, and arriving late affects you and other patients whose appointments are after yours.  Also, if you no show three or more times for appointments you may be dismissed from the clinic at the providers discretion.     Again, thank you for choosing [facility].  Our hope is that these requests will decrease the amount of time that you wait before being seen by our physicians.       _____________________________________________________________  Should you have questions after your visit to [facility], please contact our office at [phone] and follow the prompts.  Our office hours are 8:00 a.m. and 4:30 p.m. Monday - Friday.  Please note that voicemails left after 4:00 p.m. may not be returned until the following business day.  We are closed weekends and major holidays.  You do have access to a nurse 24-7, just call the main number to the clinic [phone] and do not press any options, hold on the line and a nurse will answer the phone.    For prescription refill requests,  have your pharmacy contact our office and allow 72 hours.    Due to Covid, you will need to wear a mask upon entering the hospital. If you do not have a mask, a mask will be given to you at the Main Entrance upon arrival. For doctor visits, patients may have 1 support person age 11 or older with them. For treatment visits, patients can not have anyone with them due to social distancing guidelines and our immunocompromised population.

## 2022-03-08 NOTE — Progress Notes (Signed)
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  PRIOR THERAPY: None  NGS Results: Not applicable  CURRENT THERAPY: Daratumumab, lenalidomide and dexamethasone  BRIEF ONCOLOGIC HISTORY:  Oncology History  Multiple myeloma ([place])  11/30/2021 Initial Diagnosis   Multiple myeloma ([place])   11/30/2021 Cancer Staging   Staging form: Plasma Cell Myeloma and Plasma Cell Disorders, AJCC 8th Edition - Clinical stage from 11/30/2021: RISS Stage II (Beta-2-microglobulin (mg/L): 2.8, Albumin (g/dL): 3, ISS: Stage II, High-risk cytogenetics: Absent, LDH: Normal) - Signed by Derek Jack, MD on 11/30/2021 Histopathologic type: Multiple myeloma Stage prefix: Initial diagnosis Beta 2 microglobulin range (mg/L): Less than 3.5 Albumin range (g/dL): Less than 3.5 Cytogenetics: t(11;14) translocation Serum calcium level: Normal   12/14/2021 -  Chemotherapy   Patient is on Treatment Plan : MYELOMA  Daratumumab SQ + Lenalidomide + Dexamethasone (DaraRd) q28d       CANCER STAGING:  Cancer Staging  Multiple myeloma ([place]) Staging form: Plasma Cell Myeloma and Plasma Cell Disorders, AJCC 8th Edition - Clinical stage from 11/30/2021: RISS Stage II (Beta-2-microglobulin (mg/L): 2.8, Albumin (g/dL): 3, ISS: Stage II, High-risk cytogenetics: Absent, LDH: Normal) - Signed by Derek Jack, MD on 11/30/2021    INTERVAL HISTORY:  Savannah Burns 71 y.o. female seen for follow-up of her multiple myeloma.  Reports energy levels 25%.  Dyspnea on exertion is stable.  She reports black tarry stools almost on a daily basis.  She also has occasional bitemporal headaches and takes Tylenol which helps in 40 minutes.  These headaches occur 1-2 times weekly.  She is taking dexamethasone 20 mg  every Tuesday.  Revlimid is started back on today.    REVIEW OF SYSTEMS:  Review of Systems  Respiratory:  Positive for shortness of breath (On exertion).   Neurological:  Positive for headaches.  All other systems reviewed and are negative.    PAST MEDICAL/SURGICAL HISTORY:  Past Medical History:  Diagnosis Date   Adrenal adenoma, left    Stable   Anxiety    Arthritis    bilateral hands   Depression    Diabetes mellitus, type 2 ([place]) 08/12/2008   Qualifier: Diagnosis of  By: Deborra Medina MD, Talia     Dyspnea    Esophageal varices (HCC)    Grade II diastolic dysfunction    History of kidney stones    Hyperlipidemia    Hypertension    Lower back pain    Lower GI bleed 03/19/2020   Panic attacks    Pneumonia    currently taking antibiotic and prednisone for early stages of pneumonia   Pulmonary nodules    bilateral   Skin cancer    face   Past Surgical History:  Procedure Laterality Date   BIOPSY  04/07/2020   Procedure: BIOPSY;  Surgeon: Eloise Harman, DO;  [facility];  Service: Endoscopy;;   Breast Cystectomy  Right    CESAREAN SECTION     COLONOSCOPY WITH PROPOFOL N/A 01/25/2020   Dr. Abbey Chatters: Nonbleeding internal hemorrhoids, diverticulosis, 5 mm polyp removed from the ascending colon, 10 mm polyp removed from the sigmoid colon, 30 mm polyp (tubulovillous adenoma with no high-grade dysplasia) removed from the transverse colon via piecemeal status post tattoo.  Other polyps were tubular adenomas.  3 month surveillance colonoscopy recommended.   COLONOSCOPY WITH PROPOFOL N/A 04/07/2020   Procedure: COLONOSCOPY WITH PROPOFOL;  Surgeon: Eloise Harman, DO;  [facility];  Service: Endoscopy;  Laterality: N/A;  3:00pm, pt knows new time per office   CYSTOSCOPY/URETEROSCOPY/HOLMIUM LASER/STENT PLACEMENT Bilateral 03/01/2019   Procedure: CYSTOSCOPY/RETROGRADEURETEROSCOPY/HOLMIUM LASER/STENT PLACEMENT;  Surgeon: Ceasar Mons, MD;  Location:  [facility];  Service: Urology;  Laterality: Bilateral;  ONLY NEEDS 60 MIN   ESOPHAGOGASTRODUODENOSCOPY (EGD) WITH PROPOFOL N/A 01/25/2020   Dr. Abbey Chatters: 4 columns grade 1 esophageal varices   ESOPHAGOGASTRODUODENOSCOPY (EGD) WITH PROPOFOL N/A 05/18/2020   Procedure: ESOPHAGOGASTRODUODENOSCOPY (EGD) WITH PROPOFOL;  Surgeon: Eloise Harman, DO;  [facility];  Service: Endoscopy;  Laterality: N/A;   ESOPHAGOGASTRODUODENOSCOPY (EGD) WITH PROPOFOL N/A 07/28/2020   Procedure: ESOPHAGOGASTRODUODENOSCOPY (EGD) WITH PROPOFOL;  Surgeon: Eloise Harman, DO;  [facility];  Service: Endoscopy;  Laterality: N/A;  am or early PM due to givens capsule placement   GIVENS CAPSULE STUDY N/A 05/18/2020   Procedure: [place];  Surgeon: Harvel Quale, MD;  [facility];  Service: Gastroenterology;  Laterality: N/A;   GIVENS CAPSULE STUDY N/A 07/28/2020   Procedure: GIVENS CAPSULE STUDY;  Surgeon: Eloise Harman, DO;  [facility];  Service: Endoscopy;  Laterality: N/A;   IR IMAGING GUIDED PORT INSERTION  12/02/2021   POLYPECTOMY  01/25/2020   Procedure: POLYPECTOMY;  Surgeon: Eloise Harman, DO;  [facility];  Service: Endoscopy;;   POLYPECTOMY  04/07/2020   Procedure: POLYPECTOMY INTESTINAL;  Surgeon: Eloise Harman, DO;  [facility];  Service: Endoscopy;;   SKIN CANCER EXCISION     Face   SPINE SURGERY     SUBMUCOSAL TATTOO INJECTION  01/25/2020   Procedure: SUBMUCOSAL TATTOO INJECTION;  Surgeon: Eloise Harman, DO;  [facility];  Service: Endoscopy;;     SOCIAL HISTORY:  Social History   Socioeconomic History   Marital status: Widowed    Spouse name: Not on file   Number of children: 2   Years of education: 14   Highest education level: Not on file  Occupational History   Occupation: Retired   Tobacco Use   Smoking status: Former    Packs/day: 1.50    Years: 40.00    Total pack years:  60.00    Types: Cigarettes    Quit date: 04/29/2015    Years since quitting: 6.8   Smokeless tobacco: Never   Tobacco comments:    Quit smoking 04/2015- Previous 1.5 ppd smoker  Vaping Use   Vaping Use: Never used  Substance and Sexual Activity   Alcohol use: No    Alcohol/week: 0.0 standard drinks of alcohol   Drug use: No   Sexual activity: Not Currently    Birth control/protection: Post-menopausal  Other Topics Concern   Not on file  Social History Narrative   Her 40 year old granddaughter lives with her - one daughter lives nearby, but she doesn't have a good relationship with her. Has a great relationship with other daughter who lives 1.5 hrs away - talks to her daily on the phone.   Social Determinants of Health   Financial Resource Strain: Medium Risk (07/27/2021)   Overall Financial Resource Strain (CARDIA)    Difficulty of Paying Living Expenses: Somewhat hard  Food Insecurity: Food Insecurity Present (07/27/2021)   Hunger Vital Sign    Worried About Running Out of Food  in the Last Year: Sometimes true    Ran Out of Food in the Last Year: Never true  Transportation Needs: No Transportation Needs (07/27/2021)   PRAPARE - Hydrologist (Medical): No    Lack of Transportation (Non-Medical): No  Physical Activity: Inactive (07/27/2021)   Exercise Vital Sign    Days of Exercise per Week: 0 days    Minutes of Exercise per Session: 0 min  Stress: Stress Concern Present (07/27/2021)   [place]    Feeling of Stress : To some extent  Social Connections: Socially Isolated (07/27/2021)   Social Connection and Isolation Panel [NHANES]    Frequency of Communication with Friends and Family: More than three times a week    Frequency of Social Gatherings with Friends and Family: Once a week    Attends Religious Services: Never    Marine scientist or Organizations: No    Attends Theatre manager Meetings: Never    Marital Status: Widowed  Intimate Partner Violence: Not At Risk (07/27/2021)   Humiliation, Afraid, Rape, and Kick questionnaire    Fear of Current or Ex-Partner: No    Emotionally Abused: No    Physically Abused: No    Sexually Abused: No    FAMILY HISTORY:  Family History  Problem Relation Age of Onset   Diabetes Father    Heart disease Father 59       MI   Hypertension Father    Anemia Mother        Transfusion dependent   COPD Sister    Cancer Paternal Grandmother 6       Pancreatic    CURRENT MEDICATIONS:  Outpatient Encounter Medications as of 03/08/2022  Medication Sig Note   acyclovir (ZOVIRAX) 400 MG tablet Take 1 tablet (400 mg total) by mouth 2 (two) times daily.    Blood Glucose Calibration (TRUE METRIX LEVEL 1) Low SOLN Use w/ glucose monitor Dx E11.9    Blood Glucose Monitoring Suppl (TRUE METRIX AIR GLUCOSE METER) w/Device KIT Check BS daily Dx E11.9    carvedilol (COREG) 6.25 MG tablet Take 6.25 mg by mouth 2 (two) times daily.    Cholecalciferol (VITAMIN D) 50 MCG (2000 UT) tablet Take 2,000 Units by mouth daily.    clobetasol (TEMOVATE) 0.05 % external solution Apply topically.    CONSTULOSE 10 GM/15ML solution Take by mouth.    Cyanocobalamin (B-12 COMPLIANCE INJECTION) 1000 MCG/ML KIT Inject 1,000 mcg as directed every 30 (thirty) days.    daratumumab-hyaluronidase-fihj (DARZALEX FASPRO) 1800-30000 MG-UT/15ML SOLN Inject 1,800 mg into the skin once a week.    desvenlafaxine (PRISTIQ) 100 MG 24 hr tablet Take 1 tablet (100 mg total) by mouth daily.    dexamethasone (DECADRON) 4 MG tablet Take 5 tablets (20 mg total) by mouth once a week.    esomeprazole (NEXIUM) 20 MG capsule Take 1 capsule (20 mg total) by mouth daily at 12 noon.    fluticasone (CUTIVATE) 0.05 % cream Apply 1 Application topically daily.    glucose blood (TRUE METRIX BLOOD GLUCOSE TEST) test strip Check BS daily Dx E11.9    lenalidomide (REVLIMID) 15 MG  capsule Take one capsule daily for 21 days on, 7 days off.    lidocaine-prilocaine (EMLA) cream Apply 1 Application topically as needed. Apply a small amount to port a cath site and cover with plastic wrap 1 hour prior to infusion appointments    lovastatin (  MEVACOR) 20 MG tablet Take 1 tablet (20 mg total) by mouth at bedtime.    omeprazole (PRILOSEC) 20 MG capsule Take 20 mg by mouth 2 (two) times daily.    oxyCODONE-acetaminophen (PERCOCET) 10-325 MG tablet Take 1 tablet by mouth every 6 (six) hours as needed for pain.    OXYGEN Inhale 3 L into the lungs continuous. 07/27/2021: Now using 5lpm when walking   OZEMPIC, 0.25 OR 0.5 MG/DOSE, 2 MG/3ML SOPN Inject into the skin.    potassium chloride SA (KLOR-CON) 20 MEQ tablet Take 2 tablets (40 mEq total) by mouth 2 (two) times daily. (Patient taking differently: Take 40 mEq by mouth daily.)    prochlorperazine (COMPAZINE) 10 MG tablet Take 1 tablet (10 mg total) by mouth every 6 (six) hours as needed for nausea or vomiting.    torsemide (DEMADEX) 20 MG tablet Take 3 tablets (60 mg total) by mouth 2 (two) times daily. (Patient taking differently: Take 50 mg by mouth 2 (two) times daily.)    TRUEplus Lancets 33G MISC Check BS daily Dx E11.9    ferrous sulfate 325 (65 FE) MG tablet Take 1 tablet (325 mg total) by mouth daily with breakfast. (Patient not taking: Reported on 03/08/2022)    furosemide (LASIX) 20 MG tablet Take 20 mg by mouth. bid (Patient not taking: Reported on 03/08/2022)    [DISCONTINUED] albuterol (PROVENTIL HFA;VENTOLIN HFA) 108 (90 Base) MCG/ACT inhaler Inhale 2 puffs into the lungs every 6 (six) hours as needed for wheezing or shortness of breath.    [DISCONTINUED] Cyanocobalamin (VITAMIN B-12 IJ) Inject 1,000 mcg as directed every 30 (thirty) days.    No facility-administered encounter medications on file as of 03/08/2022.    ALLERGIES:  Allergies  Allergen Reactions   Keflex [Cephalexin] Nausea And Vomiting     PHYSICAL  EXAM:  ECOG Performance status: 1  There were no vitals filed for this visit. There were no vitals filed for this visit. Physical Exam Vitals reviewed.  Constitutional:      Appearance: Normal appearance.  Cardiovascular:     Rate and Rhythm: Normal rate and regular rhythm.     Heart sounds: Normal heart sounds.  Pulmonary:     Effort: Pulmonary effort is normal.     Breath sounds: Normal breath sounds.  Abdominal:     Palpations: Abdomen is soft.  Neurological:     Mental Status: She is alert.  Psychiatric:        Mood and Affect: Mood normal.        Behavior: Behavior normal.     LABORATORY DATA:  I have reviewed the labs as listed.  CBC    Component Value Date/Time   WBC 3.4 (L) 03/08/2022 0926   RBC 3.46 (L) 03/08/2022 0926   HGB 9.2 (L) 03/08/2022 0926   HGB 9.1 (L) 02/26/2021 1129   HCT 30.8 (L) 03/08/2022 0926   HCT 30.5 (L) 02/26/2021 1129   PLT 132 (L) 03/08/2022 0926   PLT 104 (L) 02/26/2021 1129   MCV 89.0 03/08/2022 0926   MCV 87 02/26/2021 1129   MCH 26.6 03/08/2022 0926   MCHC 29.9 (L) 03/08/2022 0926   RDW 16.9 (H) 03/08/2022 0926   RDW 16.4 (H) 02/26/2021 1129   LYMPHSABS 0.9 03/08/2022 0926   LYMPHSABS 1.8 06/24/2020 1529   MONOABS 0.3 03/08/2022 0926   EOSABS 0.1 03/08/2022 0926   EOSABS 0.1 06/24/2020 1529   BASOSABS 0.0 03/08/2022 0926   BASOSABS 0.0 06/24/2020 1529  Latest Ref Rng & Units 03/08/2022    9:26 AM 03/01/2022   11:28 AM 02/08/2022   10:19 AM  CMP  Glucose 70 - 99 mg/dL 159  211  129   BUN 8 - 23 mg/dL _0 Creatinine 0.44 - 1.00 mg/dL 0.57  0.62  0.58   Sodium 135 - 145 mmol/L 138  139  138   Potassium 3.5 - 5.1 mmol/L 4.2  3.4  3.7   Chloride 98 - 111 mmol/L 105  102  106   CO2 22 - 32 mmol/L _1 Calcium 8.9 - 10.3 mg/dL 8.5  8.5  8.1   Total Protein 6.5 - 8.1 g/dL 5.9  6.0  5.7   Total Bilirubin 0.3 - 1.2 mg/dL 0.2  0.2  0.4   Alkaline Phos 38 - 126 U/L 97  90  83   AST 15 - 41 U/L _2 ALT 0 - 44 U/L _3 DIAGNOSTIC IMAGING:  I have independently reviewed the scans and discussed with the patient.  ASSESSMENT: 1.  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  Orderly maturation of erythroid and myeloid series. - Myeloma FISH panel: t(11;14) - Cytogenetics: Failed to grow metaphases - PET scan (11/18/2021): No evidence of active myeloma or plasmacytoma.  Right upper lobe lung infection. - Skull x-rays (10/29/2021) multiple discrete round lucent/lytic lesions in the skull - Worsening M spike and free light chain ratio.  Normal creatinine and calcium.  Multifactorial anemia including blood loss and bone marrow infiltration. - Cycle 1 of DRd started on 12/14/2021 with lenalidomide 15 mg 3 weeks on/1 week off and dexamethasone 20 mg weekly   2.  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  Quit smoking 15 years ago.  No exposure to chemicals or pesticides. - Paternal grandmother had ovarian cancer.  Sister had ovarian cancer.  3.  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  Seen by [facility] hepatology service at [facility].     PLAN:   1.  Stage II IgG kappa myeloma, standard risk: - She is tolerating Revlimid 15 mg 3 weeks on/1 week off very well along with dexamethasone 20 mg on Tuesdays. - She is having hot flashes and bitemporal headaches (1 to 2/week) which get better with Tylenol since the start of therapy. - Reviewed myeloma labs from 03/04/2022.  M spike is stable at 0.4 g.  Kappa light chain ratio is 4.69 and increased slightly from 4.03 last visit.  Kappa light chains are 25.3 and improving from last time at 27.4. - Reviewed labs today which showed normal LFTs and creatinine.  Calcium was normal.  CBC was grossly normal.  Anemia with hemoglobin 9.2. - Proceed with Darzalex every  2 weeks. - RTC 4 weeks for follow-up with repeat myeloma labs.  If there is any worsening of myeloma labs, will consider increasing Revlimid dose.   2.  Iron deficiency anemia from chronic GI bleed: - She is having melena every day. - We checked ferritin which is 7.  Hemoglobin today is 9.2. - Recommend Venofer x 3 for total of 1 g dose.   3.  Infection prophylaxis: - Continue acyclovir 400 mg twice daily. - Continue to hold aspirin  due to history of GI bleed.   4.  Cirrhosis: - Continue Lasix 20 mg daily as needed, carvedilol 6.25 mg daily and lactulose 30 mL daily.    Orders placed this encounter:  No orders of the defined types were placed in this encounter.     Derek Jack, MD [place] [phone]

## 2022-03-15 ENCOUNTER — Inpatient Hospital Stay: Payer: Medicare PPO

## 2022-03-15 VITALS — BP 102/43 | HR 68 | Temp 97.9°F | Resp 18

## 2022-03-15 DIAGNOSIS — D500 Iron deficiency anemia secondary to blood loss (chronic): Secondary | ICD-10-CM

## 2022-03-15 DIAGNOSIS — I10 Essential (primary) hypertension: Secondary | ICD-10-CM | POA: Diagnosis not present

## 2022-03-15 DIAGNOSIS — R519 Headache, unspecified: Secondary | ICD-10-CM | POA: Diagnosis not present

## 2022-03-15 DIAGNOSIS — E119 Type 2 diabetes mellitus without complications: Secondary | ICD-10-CM | POA: Diagnosis not present

## 2022-03-15 DIAGNOSIS — Z5112 Encounter for antineoplastic immunotherapy: Secondary | ICD-10-CM | POA: Diagnosis not present

## 2022-03-15 DIAGNOSIS — E785 Hyperlipidemia, unspecified: Secondary | ICD-10-CM | POA: Diagnosis not present

## 2022-03-15 DIAGNOSIS — K921 Melena: Secondary | ICD-10-CM | POA: Diagnosis not present

## 2022-03-15 DIAGNOSIS — C9000 Multiple myeloma not having achieved remission: Secondary | ICD-10-CM | POA: Diagnosis not present

## 2022-03-15 DIAGNOSIS — R0609 Other forms of dyspnea: Secondary | ICD-10-CM | POA: Diagnosis not present

## 2022-03-15 MED ORDER — SODIUM CHLORIDE 0.9% FLUSH
10.0000 mL | INTRAVENOUS | Status: DC | PRN
  Administered 2022-03-15: 10 mL via INTRAVENOUS

## 2022-03-15 MED ORDER — SODIUM CHLORIDE 0.9 % IV SOLN
300.0000 mg | Freq: Once | INTRAVENOUS | Status: AC
  Administered 2022-03-15: 300 mg via INTRAVENOUS
  Filled 2022-03-15: qty 300

## 2022-03-15 MED ORDER — SODIUM CHLORIDE 0.9 % IV SOLN: Freq: Once | INTRAVENOUS | Status: AC

## 2022-03-15 MED ORDER — HEPARIN SOD (PORK) LOCK FLUSH 100 UNIT/ML IV SOLN
500.0000 [IU] | Freq: Once | INTRAVENOUS | Status: AC
  Administered 2022-03-15: 500 [IU] via INTRAVENOUS

## 2022-03-15 NOTE — Patient Instructions (Signed)
[facility]  Discharge Instructions: Thank you for choosing [facility] to provide your oncology and hematology care.  If you have a lab appointment with the [facility], please come in thru the Main Entrance and check in at the main information desk.  Wear comfortable clothing and clothing appropriate for easy access to any Portacath or PICC line.   We strive to give you quality time with your provider. You may need to reschedule your appointment if you arrive late (15 or more minutes).  Arriving late affects you and other patients whose appointments are after yours.  Also, if you miss three or more appointments without notifying the office, you may be dismissed from the clinic at the provider's discretion.      For prescription refill requests, have your pharmacy contact our office and allow 72 hours for refills to be completed.    Today you received Venofer IV iron infusion.     BELOW ARE SYMPTOMS THAT SHOULD BE REPORTED IMMEDIATELY: *FEVER GREATER THAN 100.4 F (38 C) OR HIGHER *CHILLS OR SWEATING *NAUSEA AND VOMITING THAT IS NOT CONTROLLED WITH YOUR NAUSEA MEDICATION *UNUSUAL SHORTNESS OF BREATH *UNUSUAL BRUISING OR BLEEDING *URINARY PROBLEMS (pain or burning when urinating, or frequent urination) *BOWEL PROBLEMS (unusual diarrhea, constipation, pain near the anus) TENDERNESS IN MOUTH AND THROAT WITH OR WITHOUT PRESENCE OF ULCERS (sore throat, sores in mouth, or a toothache) UNUSUAL RASH, SWELLING OR PAIN  UNUSUAL VAGINAL DISCHARGE OR ITCHING   Items with * indicate a potential emergency and should be followed up as soon as possible or go to the Emergency Department if any problems should occur.  Please show the CHEMOTHERAPY ALERT CARD or IMMUNOTHERAPY ALERT CARD at check-in to the Emergency Department and triage nurse.  Should you have questions after your visit or need to cancel or reschedule your appointment, please contact [facility] [phone]  and follow the prompts.  Office hours are 8:00 a.m. to 4:30 p.m. Monday - Friday. Please note that voicemails left after 4:00 p.m. may not be returned until the following business day.  We are closed weekends and major holidays. You have access to a nurse at all times for urgent questions. Please call the main number to the clinic [phone] and follow the prompts.  For any non-urgent questions, you may also contact your provider using MyChart. We now offer e-Visits for anyone 68 and older to request care online for non-urgent symptoms. For details visit [email or web address].   Also download the MyChart app! Go to the app store, search "MyChart", open the app, select [facility], and log in with your MyChart username and password.  Masks are optional in the cancer centers. If you would like for your care team to wear a mask while they are taking care of you, please let them know. You may have one support person who is at least 72 years old accompany you for your appointments.

## 2022-03-15 NOTE — Progress Notes (Signed)
Chaplain engaged in a follow-up visit with Savannah Burns.  She shared about the stressors happening in her life and her desires for community, love and support.  Chaplain assesses that a support group will be beneficial for Savannah Burns, giving her the space to share and be in relationship with others who understand what she is going through.  Chaplain believes that the [facility] would be great for her.   Chaplain acknowledged that there are a lot of transitions happening in [name]'s life that have seemingly left her feeling by herself or without adequate support.  Savannah Burns continues to voice a need for more help at home.   Chaplain offered listening, support, and will reach out to Social Work to find resources for Savannah Burns.      03/15/22 1100  Clinical Encounter Type  Visited With Patient  Visit Type Follow-up;Spiritual support

## 2022-03-15 NOTE — Progress Notes (Signed)
Pt presents today for Venofer IV iron infusion per provider's order. Vital signs stable and pt voiced no new complaints at this time.  Pt took pre-meds at home prior to arrival.  Venofer 300 mg given today per MD orders. Tolerated infusion without adverse affects. Vital signs stable. No complaints at this time. Discharged from clinic via wheelchair in stable condition. Alert and oriented x 3. F/U with [facility] as scheduled.

## 2022-03-17 ENCOUNTER — Inpatient Hospital Stay: Payer: Medicare PPO | Admitting: Licensed Clinical Social Worker

## 2022-03-17 DIAGNOSIS — C9000 Multiple myeloma not having achieved remission: Secondary | ICD-10-CM

## 2022-03-17 NOTE — Progress Notes (Signed)
[facility] CSW Progress Note  Clinical Education officer, museum  received a referral from the chaplain to reach out to pt regarding supportive services.  Pt does not reside in [facility]; therefore, Duanne L[facility]ick is not available.  CSW attempted to contact pt by phone w/ no answer.  Voice message left w/ contact details for CSW.  CSW also sent an email to pt w/ supportive resources attached through [facility] cancer center which pt would be eligible for.  CSW to continue to try to reach pt.    Henriette Combs, LCSW

## 2022-03-22 ENCOUNTER — Other Ambulatory Visit: Payer: Medicare PPO

## 2022-03-22 ENCOUNTER — Ambulatory Visit: Payer: Medicare PPO | Admitting: Hematology

## 2022-03-22 ENCOUNTER — Inpatient Hospital Stay: Payer: Medicare PPO

## 2022-03-22 ENCOUNTER — Ambulatory Visit: Payer: Medicare PPO

## 2022-03-22 ENCOUNTER — Other Ambulatory Visit: Payer: Self-pay | Admitting: Hematology

## 2022-03-22 VITALS — BP 123/60 | HR 71 | Temp 98.1°F | Resp 20 | Wt 301.6 lb

## 2022-03-22 VITALS — BP 119/50 | HR 64 | Temp 98.7°F | Resp 18

## 2022-03-22 DIAGNOSIS — D500 Iron deficiency anemia secondary to blood loss (chronic): Secondary | ICD-10-CM

## 2022-03-22 DIAGNOSIS — I10 Essential (primary) hypertension: Secondary | ICD-10-CM | POA: Diagnosis not present

## 2022-03-22 DIAGNOSIS — C9000 Multiple myeloma not having achieved remission: Secondary | ICD-10-CM | POA: Diagnosis not present

## 2022-03-22 DIAGNOSIS — K921 Melena: Secondary | ICD-10-CM | POA: Diagnosis not present

## 2022-03-22 DIAGNOSIS — E119 Type 2 diabetes mellitus without complications: Secondary | ICD-10-CM | POA: Diagnosis not present

## 2022-03-22 DIAGNOSIS — Z95828 Presence of other vascular implants and grafts: Secondary | ICD-10-CM

## 2022-03-22 DIAGNOSIS — Z5112 Encounter for antineoplastic immunotherapy: Secondary | ICD-10-CM | POA: Diagnosis not present

## 2022-03-22 DIAGNOSIS — R519 Headache, unspecified: Secondary | ICD-10-CM | POA: Diagnosis not present

## 2022-03-22 DIAGNOSIS — R0609 Other forms of dyspnea: Secondary | ICD-10-CM | POA: Diagnosis not present

## 2022-03-22 DIAGNOSIS — E785 Hyperlipidemia, unspecified: Secondary | ICD-10-CM | POA: Diagnosis not present

## 2022-03-22 LAB — CBC WITH DIFFERENTIAL/PLATELET
Abs Immature Granulocytes: 0 10*3/uL (ref 0.00–0.07)
Basophils Absolute: 0 10*3/uL (ref 0.0–0.1)
Basophils Relative: 0 %
Eosinophils Absolute: 0 10*3/uL (ref 0.0–0.5)
Eosinophils Relative: 1 %
HCT: 25.8 % — ABNORMAL LOW (ref 36.0–46.0)
Hemoglobin: 7.7 g/dL — ABNORMAL LOW (ref 12.0–15.0)
Immature Granulocytes: 0 %
Lymphocytes Relative: 36 %
Lymphs Abs: 1 10*3/uL (ref 0.7–4.0)
MCH: 27.5 pg (ref 26.0–34.0)
MCHC: 29.8 g/dL — ABNORMAL LOW (ref 30.0–36.0)
MCV: 92.1 fL (ref 80.0–100.0)
Monocytes Absolute: 0.3 10*3/uL (ref 0.1–1.0)
Monocytes Relative: 12 %
Neutro Abs: 1.4 10*3/uL — ABNORMAL LOW (ref 1.7–7.7)
Neutrophils Relative %: 51 %
Platelets: 90 10*3/uL — ABNORMAL LOW (ref 150–400)
RBC: 2.8 MIL/uL — ABNORMAL LOW (ref 3.87–5.11)
RDW: 19.9 % — ABNORMAL HIGH (ref 11.5–15.5)
WBC: 2.8 10*3/uL — ABNORMAL LOW (ref 4.0–10.5)
nRBC: 0 % (ref 0.0–0.2)

## 2022-03-22 LAB — SAMPLE TO BLOOD BANK

## 2022-03-22 MED ORDER — SODIUM CHLORIDE 0.9% FLUSH
10.0000 mL | Freq: Once | INTRAVENOUS | Status: AC
  Administered 2022-03-22: 10 mL via INTRAVENOUS

## 2022-03-22 MED ORDER — SODIUM CHLORIDE 0.9 % IV SOLN: Freq: Once | INTRAVENOUS | Status: AC

## 2022-03-22 MED ORDER — SODIUM CHLORIDE 0.9 % IV SOLN
300.0000 mg | Freq: Once | INTRAVENOUS | Status: AC
  Administered 2022-03-22: 300 mg via INTRAVENOUS
  Filled 2022-03-22: qty 300

## 2022-03-22 MED ORDER — HEPARIN SOD (PORK) LOCK FLUSH 100 UNIT/ML IV SOLN
500.0000 [IU] | Freq: Once | INTRAVENOUS | Status: AC
  Administered 2022-03-22: 500 [IU] via INTRAVENOUS

## 2022-03-22 MED ORDER — DARATUMUMAB-HYALURONIDASE-FIHJ 1800-30000 MG-UT/15ML ~~LOC~~ SOLN
1800.0000 mg | Freq: Once | SUBCUTANEOUS | Status: AC
  Administered 2022-03-22: 1800 mg via SUBCUTANEOUS
  Filled 2022-03-22: qty 15

## 2022-03-22 MED ORDER — SODIUM CHLORIDE 0.9% FLUSH
10.0000 mL | INTRAVENOUS | Status: DC | PRN
  Administered 2022-03-22: 10 mL

## 2022-03-22 NOTE — Progress Notes (Signed)
Pt presents today for Dara [place] and Venofer per provider's order. Vital signs and other labs WNL for treatment today. Pt's hemoglobin is 7.7 and platelets are 90 today. Message sent to Dr.K and he stated okay to proceed with treatment today. Dr.K also stated for pt to return next week for cbc diff/BB sample recheck. Pt made aware and verbalized understanding.  Pt took pre-meds at home prior to arrival for Venofer and Dara [place].  Dara [place] and Venofer 300 mg given today per MD orders. Tolerated infusion without adverse affects. Vital signs stable. No complaints at this time. Discharged from clinic via wheelchair in stable condition. Alert and oriented x 3. F/U with [name] as scheduled.

## 2022-03-22 NOTE — Patient Instructions (Signed)
[facility]  Discharge Instructions: Thank you for choosing [facility] to provide your oncology and hematology care.  If you have a lab appointment with the [facility], please come in thru the Main Entrance and check in at the main information desk.  Wear comfortable clothing and clothing appropriate for easy access to any Portacath or PICC line.   We strive to give you quality time with your provider. You may need to reschedule your appointment if you arrive late (15 or more minutes).  Arriving late affects you and other patients whose appointments are after yours.  Also, if you miss three or more appointments without notifying the office, you may be dismissed from the clinic at the provider's discretion.      For prescription refill requests, have your pharmacy contact our office and allow 72 hours for refills to be completed.    Today you received the following chemotherapy and/or immunotherapy agents Venofer IV iron and Dara Arion   To help prevent nausea and vomiting after your treatment, we encourage you to take your nausea medication as directed.  Daratumumab; Hyaluronidase Injection What is this medication? DARATUMUMAB; HYALURONIDASE (dar a toom ue mab; hye al ur ON i dase) treats multiple myeloma, a type of bone marrow cancer. Daratumumab works by blocking a protein that causes cancer cells to grow and multiply. This helps to slow or stop the spread of cancer cells. Hyaluronidase works by increasing the absorption of other medications in the body to help them work better. This medication may also be used treat amyloidosis, a condition that causes the buildup of a protein (amyloid) in your body. It works by reducing the buildup of this protein, which decreases symptoms. It is a combination medication that contains a monoclonal antibody. This medicine may be used for other purposes; ask your health care provider or pharmacist if you have questions. COMMON  BRAND NAME(S): DARZALEX FASPRO What should I tell my care team before I take this medication? They need to know if you have any of these conditions: Heart disease Infection, such as chickenpox, cold sores, herpes, hepatitis B Lung or breathing disease An unusual or allergic reaction to daratumumab, hyaluronidase, other medications, foods, dyes, or preservatives Pregnant or trying to get pregnant Breast-feeding How should I use this medication? This medication is injected under the skin. It is given by your care team in a hospital or clinic setting. Talk to your care team about the use of this medication in children. Special care may be needed. Overdosage: If you think you have taken too much of this medicine contact a poison control center or emergency room at once. NOTE: This medicine is only for you. Do not share this medicine with others. What if I miss a dose? Keep appointments for follow-up doses. It is important not to miss your dose. Call your care team if you are unable to keep an appointment. What may interact with this medication? Interactions have not been studied. This list may not describe all possible interactions. Give your health care provider a list of all the medicines, herbs, non-prescription drugs, or dietary supplements you use. Also tell them if you smoke, drink alcohol, or use illegal drugs. Some items may interact with your medicine. What should I watch for while using this medication? Your condition will be monitored carefully while you are receiving this medication. This medication can cause serious allergic reactions. To reduce your risk, your care team may give you other medication to  take before receiving this one. Be sure to follow the directions from your care team. This medication can affect the results of blood tests to match your blood type. These changes can last for up to 6 months after the final dose. Your care team will do blood tests to match your blood  type before you start treatment. Tell all of your care team that you are being treated with this medication before receiving a blood transfusion. This medication can affect the results of some tests used to determine treatment response; extra tests may be needed to evaluate response. Talk to your care team if you wish to become pregnant or think you are pregnant. This medication can cause serious birth defects if taken during pregnancy and for 3 months after the last dose. A reliable form of contraception is recommended while taking this medication and for 3 months after the last dose. Talk to your care team about effective forms of contraception. Do not breast-feed while taking this medication. What side effects may I notice from receiving this medication? Side effects that you should report to your care team as soon as possible: Allergic reactions--skin rash, itching, hives, swelling of the face, lips, tongue, or throat Heart rhythm changes--fast or irregular heartbeat, dizziness, feeling faint or lightheaded, chest pain, trouble breathing Infection--fever, chills, cough, sore throat, wounds that don't heal, pain or trouble when passing urine, general feeling of discomfort or being unwell Infusion reactions--chest pain, shortness of breath or trouble breathing, feeling faint or lightheaded Sudden eye pain or change in vision such as blurry vision, seeing halos around lights, vision loss Unusual bruising or bleeding Side effects that usually do not require medical attention (report to your care team if they continue or are bothersome): Constipation Diarrhea Fatigue Nausea Pain, tingling, or numbness in the hands or feet Swelling of the ankles, hands, or feet This list may not describe all possible side effects. Call your doctor for medical advice about side effects. You may report side effects to FDA at 1-800-FDA-1088. Where should I keep my medication? This medication is given in a hospital or  clinic. It will not be stored at home. NOTE: This sheet is a summary. It may not cover all possible information. If you have questions about this medicine, talk to your doctor, pharmacist, or health care provider.  2023 Elsevier/Gold Standard (2021-07-07 00:00:00)   BELOW ARE SYMPTOMS THAT SHOULD BE REPORTED IMMEDIATELY: *FEVER GREATER THAN 100.4 F (38 C) OR HIGHER *CHILLS OR SWEATING *NAUSEA AND VOMITING THAT IS NOT CONTROLLED WITH YOUR NAUSEA MEDICATION *UNUSUAL SHORTNESS OF BREATH *UNUSUAL BRUISING OR BLEEDING *URINARY PROBLEMS (pain or burning when urinating, or frequent urination) *BOWEL PROBLEMS (unusual diarrhea, constipation, pain near the anus) TENDERNESS IN MOUTH AND THROAT WITH OR WITHOUT PRESENCE OF ULCERS (sore throat, sores in mouth, or a toothache) UNUSUAL RASH, SWELLING OR PAIN  UNUSUAL VAGINAL DISCHARGE OR ITCHING   Items with * indicate a potential emergency and should be followed up as soon as possible or go to the Emergency Department if any problems should occur.  Please show the CHEMOTHERAPY ALERT CARD or IMMUNOTHERAPY ALERT CARD at check-in to the Emergency Department and triage nurse.  Should you have questions after your visit or need to cancel or reschedule your appointment, please contact [facility] [phone]  and follow the prompts.  Office hours are 8:00 a.m. to 4:30 p.m. Monday - Friday. Please note that voicemails left after 4:00 p.m. may not be returned until the following business  day.  We are closed weekends and major holidays. You have access to a nurse at all times for urgent questions. Please call the main number to the clinic [phone] and follow the prompts.  For any non-urgent questions, you may also contact your provider using MyChart. We now offer e-Visits for anyone 59 and older to request care online for non-urgent symptoms. For details visit [email or web address].   Also download the MyChart app! Go to the app  store, search "MyChart", open the app, select [facility], and log in with your MyChart username and password.  Masks are optional in the cancer centers. If you would like for your care team to wear a mask while they are taking care of you, please let them know. You may have one support person who is at least 72 years old accompany you for your appointments.

## 2022-03-23 ENCOUNTER — Other Ambulatory Visit: Payer: Self-pay

## 2022-03-23 DIAGNOSIS — C9000 Multiple myeloma not having achieved remission: Secondary | ICD-10-CM

## 2022-03-23 MED ORDER — LENALIDOMIDE 15 MG PO CAPS: ORAL_CAPSULE | ORAL | 0 refills | Status: DC

## 2022-03-23 NOTE — Telephone Encounter (Signed)
Chart reviewed. Revlimid refilled per last office note with Dr. Delton Coombes.

## 2022-03-29 ENCOUNTER — Inpatient Hospital Stay: Payer: Medicare PPO | Attending: Hematology

## 2022-03-29 VITALS — BP 122/47 | HR 77 | Temp 99.2°F | Resp 16

## 2022-03-29 DIAGNOSIS — I10 Essential (primary) hypertension: Secondary | ICD-10-CM | POA: Diagnosis not present

## 2022-03-29 DIAGNOSIS — D696 Thrombocytopenia, unspecified: Secondary | ICD-10-CM | POA: Diagnosis not present

## 2022-03-29 DIAGNOSIS — Z87891 Personal history of nicotine dependence: Secondary | ICD-10-CM | POA: Insufficient documentation

## 2022-03-29 DIAGNOSIS — Z79624 Long term (current) use of inhibitors of nucleotide synthesis: Secondary | ICD-10-CM | POA: Insufficient documentation

## 2022-03-29 DIAGNOSIS — Z7961 Long term (current) use of immunomodulator: Secondary | ICD-10-CM | POA: Insufficient documentation

## 2022-03-29 DIAGNOSIS — K7460 Unspecified cirrhosis of liver: Secondary | ICD-10-CM | POA: Diagnosis not present

## 2022-03-29 DIAGNOSIS — Z8041 Family history of malignant neoplasm of ovary: Secondary | ICD-10-CM | POA: Diagnosis not present

## 2022-03-29 DIAGNOSIS — D500 Iron deficiency anemia secondary to blood loss (chronic): Secondary | ICD-10-CM | POA: Insufficient documentation

## 2022-03-29 DIAGNOSIS — Z7952 Long term (current) use of systemic steroids: Secondary | ICD-10-CM | POA: Diagnosis not present

## 2022-03-29 DIAGNOSIS — E785 Hyperlipidemia, unspecified: Secondary | ICD-10-CM | POA: Insufficient documentation

## 2022-03-29 DIAGNOSIS — E119 Type 2 diabetes mellitus without complications: Secondary | ICD-10-CM | POA: Insufficient documentation

## 2022-03-29 DIAGNOSIS — Z85828 Personal history of other malignant neoplasm of skin: Secondary | ICD-10-CM | POA: Diagnosis not present

## 2022-03-29 DIAGNOSIS — Z95828 Presence of other vascular implants and grafts: Secondary | ICD-10-CM

## 2022-03-29 DIAGNOSIS — C9000 Multiple myeloma not having achieved remission: Secondary | ICD-10-CM | POA: Insufficient documentation

## 2022-03-29 DIAGNOSIS — Z87442 Personal history of urinary calculi: Secondary | ICD-10-CM | POA: Insufficient documentation

## 2022-03-29 DIAGNOSIS — Z5112 Encounter for antineoplastic immunotherapy: Secondary | ICD-10-CM | POA: Insufficient documentation

## 2022-03-29 LAB — CBC WITH DIFFERENTIAL/PLATELET
Abs Immature Granulocytes: 0 10*3/uL (ref 0.00–0.07)
Basophils Absolute: 0 10*3/uL (ref 0.0–0.1)
Basophils Relative: 0 %
Eosinophils Absolute: 0.1 10*3/uL (ref 0.0–0.5)
Eosinophils Relative: 3 %
HCT: 25 % — ABNORMAL LOW (ref 36.0–46.0)
Hemoglobin: 7.4 g/dL — ABNORMAL LOW (ref 12.0–15.0)
Immature Granulocytes: 0 %
Lymphocytes Relative: 46 %
Lymphs Abs: 1.2 10*3/uL (ref 0.7–4.0)
MCH: 28.9 pg (ref 26.0–34.0)
MCHC: 29.6 g/dL — ABNORMAL LOW (ref 30.0–36.0)
MCV: 97.7 fL (ref 80.0–100.0)
Monocytes Absolute: 0.4 10*3/uL (ref 0.1–1.0)
Monocytes Relative: 17 %
Neutro Abs: 0.9 10*3/uL — ABNORMAL LOW (ref 1.7–7.7)
Neutrophils Relative %: 34 %
Platelets: 92 10*3/uL — ABNORMAL LOW (ref 150–400)
RBC: 2.56 MIL/uL — ABNORMAL LOW (ref 3.87–5.11)
RDW: 22.1 % — ABNORMAL HIGH (ref 11.5–15.5)
Smear Review: DECREASED
WBC: 2.5 10*3/uL — ABNORMAL LOW (ref 4.0–10.5)
nRBC: 0 % (ref 0.0–0.2)

## 2022-03-29 LAB — SAMPLE TO BLOOD BANK

## 2022-03-29 MED ORDER — HEPARIN SOD (PORK) LOCK FLUSH 100 UNIT/ML IV SOLN
500.0000 [IU] | Freq: Once | INTRAVENOUS | Status: AC
  Administered 2022-03-29: 500 [IU] via INTRAVENOUS

## 2022-03-29 MED ORDER — SODIUM CHLORIDE 0.9% FLUSH
10.0000 mL | Freq: Once | INTRAVENOUS | Status: AC
  Administered 2022-03-29: 10 mL via INTRAVENOUS

## 2022-03-29 NOTE — Progress Notes (Signed)
Port flushed with good blood return noted. No bruising or swelling at site. Bandaid applied and patient discharged in satisfactory condition. VVS stable with no signs or symptoms of distressed noted. 

## 2022-03-29 NOTE — Patient Instructions (Signed)
[facility]  Discharge Instructions: Thank you for choosing [facility] to provide your oncology and hematology care.  If you have a lab appointment with the [facility], please come in thru the Main Entrance and check in at the main information desk.  Wear comfortable clothing and clothing appropriate for easy access to any Portacath or PICC line.   We strive to give you quality time with your provider. You may need to reschedule your appointment if you arrive late (15 or more minutes).  Arriving late affects you and other patients whose appointments are after yours.  Also, if you miss three or more appointments without notifying the office, you may be dismissed from the clinic at the provider's discretion.      For prescription refill requests, have your pharmacy contact our office and allow 72 hours for refills to be completed.    Today you received the following port flush and lab draw, return as scheduled.   To help prevent nausea and vomiting after your treatment, we encourage you to take your nausea medication as directed.  BELOW ARE SYMPTOMS THAT SHOULD BE REPORTED IMMEDIATELY: *FEVER GREATER THAN 100.4 F (38 C) OR HIGHER *CHILLS OR SWEATING *NAUSEA AND VOMITING THAT IS NOT CONTROLLED WITH YOUR NAUSEA MEDICATION *UNUSUAL SHORTNESS OF BREATH *UNUSUAL BRUISING OR BLEEDING *URINARY PROBLEMS (pain or burning when urinating, or frequent urination) *BOWEL PROBLEMS (unusual diarrhea, constipation, pain near the anus) TENDERNESS IN MOUTH AND THROAT WITH OR WITHOUT PRESENCE OF ULCERS (sore throat, sores in mouth, or a toothache) UNUSUAL RASH, SWELLING OR PAIN  UNUSUAL VAGINAL DISCHARGE OR ITCHING   Items with * indicate a potential emergency and should be followed up as soon as possible or go to the Emergency Department if any problems should occur.  Please show the CHEMOTHERAPY ALERT CARD or IMMUNOTHERAPY ALERT CARD at check-in to the Emergency  Department and triage nurse.  Should you have questions after your visit or need to cancel or reschedule your appointment, please contact [facility] [phone]  and follow the prompts.  Office hours are 8:00 a.m. to 4:30 p.m. Monday - Friday. Please note that voicemails left after 4:00 p.m. may not be returned until the following business day.  We are closed weekends and major holidays. You have access to a nurse at all times for urgent questions. Please call the main number to the clinic [phone] and follow the prompts.  For any non-urgent questions, you may also contact your provider using MyChart. We now offer e-Visits for anyone 12 and older to request care online for non-urgent symptoms. For details visit [email or web address].   Also download the MyChart app! Go to the app store, search "MyChart", open the app, select [facility], and log in with your MyChart username and password.

## 2022-03-30 ENCOUNTER — Telehealth: Payer: Self-pay | Admitting: Family Medicine

## 2022-03-30 ENCOUNTER — Other Ambulatory Visit: Payer: Medicare PPO

## 2022-03-30 ENCOUNTER — Other Ambulatory Visit: Payer: Self-pay | Admitting: *Deleted

## 2022-03-30 ENCOUNTER — Other Ambulatory Visit: Payer: Self-pay

## 2022-03-30 ENCOUNTER — Telehealth: Payer: Self-pay

## 2022-03-30 DIAGNOSIS — I714 Abdominal aortic aneurysm, without rupture, unspecified: Secondary | ICD-10-CM

## 2022-03-30 DIAGNOSIS — I5032 Chronic diastolic (congestive) heart failure: Secondary | ICD-10-CM

## 2022-03-30 DIAGNOSIS — E1159 Type 2 diabetes mellitus with other circulatory complications: Secondary | ICD-10-CM

## 2022-03-30 DIAGNOSIS — D500 Iron deficiency anemia secondary to blood loss (chronic): Secondary | ICD-10-CM

## 2022-03-30 LAB — TYPE AND SCREEN
ABO/RH(D): A POS
Antibody Screen: POSITIVE

## 2022-03-30 LAB — PREPARE RBC (CROSSMATCH)

## 2022-03-30 NOTE — Telephone Encounter (Signed)
Called patient to explain we needed extra labs to be able to get her blood. Informed patient to come for labs tomorrow morning and then blood on Friday. Patient agreed and understood.

## 2022-03-30 NOTE — Addendum Note (Signed)
Addended by: [name] on: 03/30/2022 02:56 PM   Modules accepted: Orders

## 2022-03-30 NOTE — Telephone Encounter (Signed)
done

## 2022-03-30 NOTE — Telephone Encounter (Signed)
Pt needs referral to a new cardiologist. Patient cannot have an appointment on Tuesday as she does go to the hospital on tuesdays   Prefers [facility] if available

## 2022-03-30 NOTE — Progress Notes (Unsigned)
Hemoglobin was 7.4 yesterday, reviewed with MD. Will order one unit of blood for transfusion tomorrow per MD. Will also draw labs per MD prior to blood transfusion.

## 2022-03-30 NOTE — Telephone Encounter (Signed)
Patient needs a new cardiologist, she said that she received a letter from the one that she was going to saying that she is no longer a patient with them. Please call back and advise.

## 2022-03-31 ENCOUNTER — Ambulatory Visit: Payer: Medicare PPO

## 2022-03-31 ENCOUNTER — Inpatient Hospital Stay: Payer: Medicare PPO

## 2022-03-31 ENCOUNTER — Other Ambulatory Visit: Payer: Medicare PPO

## 2022-03-31 DIAGNOSIS — E785 Hyperlipidemia, unspecified: Secondary | ICD-10-CM | POA: Diagnosis not present

## 2022-03-31 DIAGNOSIS — Z87442 Personal history of urinary calculi: Secondary | ICD-10-CM | POA: Diagnosis not present

## 2022-03-31 DIAGNOSIS — D696 Thrombocytopenia, unspecified: Secondary | ICD-10-CM | POA: Diagnosis not present

## 2022-03-31 DIAGNOSIS — Z5112 Encounter for antineoplastic immunotherapy: Secondary | ICD-10-CM | POA: Diagnosis not present

## 2022-03-31 DIAGNOSIS — I10 Essential (primary) hypertension: Secondary | ICD-10-CM | POA: Diagnosis not present

## 2022-03-31 DIAGNOSIS — K7460 Unspecified cirrhosis of liver: Secondary | ICD-10-CM | POA: Diagnosis not present

## 2022-03-31 DIAGNOSIS — D500 Iron deficiency anemia secondary to blood loss (chronic): Secondary | ICD-10-CM | POA: Diagnosis not present

## 2022-03-31 DIAGNOSIS — C9000 Multiple myeloma not having achieved remission: Secondary | ICD-10-CM | POA: Diagnosis not present

## 2022-03-31 DIAGNOSIS — E119 Type 2 diabetes mellitus without complications: Secondary | ICD-10-CM | POA: Diagnosis not present

## 2022-03-31 LAB — FERRITIN: Ferritin: 66 ng/mL (ref 11–307)

## 2022-03-31 LAB — IRON AND TIBC
Iron: 38 ug/dL (ref 28–170)
Saturation Ratios: 9 % — ABNORMAL LOW (ref 10.4–31.8)
TIBC: 425 ug/dL (ref 250–450)
UIBC: 387 ug/dL

## 2022-04-01 ENCOUNTER — Inpatient Hospital Stay: Payer: Medicare PPO

## 2022-04-01 DIAGNOSIS — G894 Chronic pain syndrome: Secondary | ICD-10-CM | POA: Diagnosis not present

## 2022-04-01 DIAGNOSIS — Z79899 Other long term (current) drug therapy: Secondary | ICD-10-CM | POA: Diagnosis not present

## 2022-04-01 DIAGNOSIS — D500 Iron deficiency anemia secondary to blood loss (chronic): Secondary | ICD-10-CM

## 2022-04-01 DIAGNOSIS — M5136 Other intervertebral disc degeneration, lumbar region: Secondary | ICD-10-CM | POA: Diagnosis not present

## 2022-04-01 DIAGNOSIS — E119 Type 2 diabetes mellitus without complications: Secondary | ICD-10-CM | POA: Diagnosis not present

## 2022-04-01 DIAGNOSIS — F1120 Opioid dependence, uncomplicated: Secondary | ICD-10-CM | POA: Diagnosis not present

## 2022-04-01 DIAGNOSIS — R030 Elevated blood-pressure reading, without diagnosis of hypertension: Secondary | ICD-10-CM | POA: Diagnosis not present

## 2022-04-01 DIAGNOSIS — Z87442 Personal history of urinary calculi: Secondary | ICD-10-CM | POA: Diagnosis not present

## 2022-04-01 DIAGNOSIS — D696 Thrombocytopenia, unspecified: Secondary | ICD-10-CM | POA: Diagnosis not present

## 2022-04-01 DIAGNOSIS — I10 Essential (primary) hypertension: Secondary | ICD-10-CM | POA: Diagnosis not present

## 2022-04-01 DIAGNOSIS — C9000 Multiple myeloma not having achieved remission: Secondary | ICD-10-CM | POA: Diagnosis not present

## 2022-04-01 DIAGNOSIS — Z5112 Encounter for antineoplastic immunotherapy: Secondary | ICD-10-CM | POA: Diagnosis not present

## 2022-04-01 DIAGNOSIS — Z6841 Body Mass Index (BMI) 40.0 and over, adult: Secondary | ICD-10-CM | POA: Diagnosis not present

## 2022-04-01 DIAGNOSIS — E785 Hyperlipidemia, unspecified: Secondary | ICD-10-CM | POA: Diagnosis not present

## 2022-04-01 DIAGNOSIS — K7460 Unspecified cirrhosis of liver: Secondary | ICD-10-CM | POA: Diagnosis not present

## 2022-04-01 LAB — TYPE AND SCREEN
ABO/RH(D): A POS
Antibody Screen: POSITIVE
Unit division: 0
Unit division: 0

## 2022-04-01 LAB — BPAM RBC
Blood Product Expiration Date: 202402082359
Blood Product Expiration Date: 202402122359
Unit Type and Rh: 6200
Unit Type and Rh: 6200

## 2022-04-01 LAB — PREPARE RBC (CROSSMATCH)

## 2022-04-01 MED ORDER — SODIUM CHLORIDE 0.9% FLUSH
10.0000 mL | INTRAVENOUS | Status: AC | PRN
  Administered 2022-04-01: 10 mL

## 2022-04-01 MED ORDER — HEPARIN SOD (PORK) LOCK FLUSH 100 UNIT/ML IV SOLN
500.0000 [IU] | Freq: Every day | INTRAVENOUS | Status: AC | PRN
  Administered 2022-04-01: 500 [IU]

## 2022-04-01 MED ORDER — DIPHENHYDRAMINE HCL 25 MG PO CAPS: 25.0000 mg | ORAL_CAPSULE | Freq: Once | ORAL | Status: DC

## 2022-04-01 MED ORDER — ACETAMINOPHEN 325 MG PO TABS: 650.0000 mg | ORAL_TABLET | Freq: Once | ORAL | Status: DC

## 2022-04-01 MED ORDER — SODIUM CHLORIDE 0.9% IV SOLUTION
250.0000 mL | Freq: Once | INTRAVENOUS | Status: AC
  Administered 2022-04-01: 250 mL via INTRAVENOUS

## 2022-04-01 NOTE — Patient Instructions (Signed)
[facility]  Discharge Instructions: Thank you for choosing [facility] to provide your oncology and hematology care.  If you have a lab appointment with the Cancer Center, please come in thru the Main Entrance and check in at the main information desk.  Wear comfortable clothing and clothing appropriate for easy access to any Portacath or PICC line.   We strive to give you quality time with your provider. You may need to reschedule your appointment if you arrive late (15 or more minutes).  Arriving late affects you and other patients whose appointments are after yours.  Also, if you miss three or more appointments without notifying the office, you may be dismissed from the clinic at the provider's discretion.      For prescription refill requests, have your pharmacy contact our office and allow 72 hours for refills to be completed.    Today you received the following chemotherapy and/or immunotherapy agents 1UPRBC      To help prevent nausea and vomiting after your treatment, we encourage you to take your nausea medication as directed.  BELOW ARE SYMPTOMS THAT SHOULD BE REPORTED IMMEDIATELY: *FEVER GREATER THAN 100.4 F (38 C) OR HIGHER *CHILLS OR SWEATING *NAUSEA AND VOMITING THAT IS NOT CONTROLLED WITH YOUR NAUSEA MEDICATION *UNUSUAL SHORTNESS OF BREATH *UNUSUAL BRUISING OR BLEEDING *URINARY PROBLEMS (pain or burning when urinating, or frequent urination) *BOWEL PROBLEMS (unusual diarrhea, constipation, pain near the anus) TENDERNESS IN MOUTH AND THROAT WITH OR WITHOUT PRESENCE OF ULCERS (sore throat, sores in mouth, or a toothache) UNUSUAL RASH, SWELLING OR PAIN  UNUSUAL VAGINAL DISCHARGE OR ITCHING   Items with * indicate a potential emergency and should be followed up as soon as possible or go to the Emergency Department if any problems should occur.  Please show the CHEMOTHERAPY ALERT CARD or IMMUNOTHERAPY ALERT CARD at check-in to the Emergency  Department and triage nurse.  Should you have questions after your visit or need to cancel or reschedule your appointment, please contact [facility] [phone]  and follow the prompts.  Office hours are 8:00 a.m. to 4:30 p.m. Monday - Friday. Please note that voicemails left after 4:00 p.m. may not be returned until the following business day.  We are closed weekends and major holidays. You have access to a nurse at all times for urgent questions. Please call the main number to the clinic [phone] and follow the prompts.  For any non-urgent questions, you may also contact your provider using MyChart. We now offer e-Visits for anyone 18 and older to request care online for non-urgent symptoms. For details visit [email or web address].   Also download the MyChart app! Go to the app store, search "MyChart", open the app, select [facility], and log in with your MyChart username and password.   

## 2022-04-01 NOTE — Progress Notes (Signed)
Patient presents today for 1UPRBC per providers order.  Vital signs WNL.  Patient took premedications prior to visit.  No new complaints at this time.  Stable during transfusion without adverse affects.  Vital signs stable.  No complaints at this time.  Discharge from clinic via wheelchair in stable condition.  Alert and oriented X 3.  Follow up with [facility] as scheduled.

## 2022-04-05 ENCOUNTER — Inpatient Hospital Stay: Payer: Medicare PPO

## 2022-04-05 ENCOUNTER — Inpatient Hospital Stay (HOSPITAL_BASED_OUTPATIENT_CLINIC_OR_DEPARTMENT_OTHER): Payer: Medicare PPO | Admitting: Hematology

## 2022-04-05 VITALS — BP 114/46 | HR 87 | Temp 97.7°F | Resp 18 | Wt 294.8 lb

## 2022-04-05 DIAGNOSIS — E785 Hyperlipidemia, unspecified: Secondary | ICD-10-CM | POA: Diagnosis not present

## 2022-04-05 DIAGNOSIS — D500 Iron deficiency anemia secondary to blood loss (chronic): Secondary | ICD-10-CM

## 2022-04-05 DIAGNOSIS — E119 Type 2 diabetes mellitus without complications: Secondary | ICD-10-CM | POA: Diagnosis not present

## 2022-04-05 DIAGNOSIS — I10 Essential (primary) hypertension: Secondary | ICD-10-CM | POA: Diagnosis not present

## 2022-04-05 DIAGNOSIS — D696 Thrombocytopenia, unspecified: Secondary | ICD-10-CM | POA: Diagnosis not present

## 2022-04-05 DIAGNOSIS — C9000 Multiple myeloma not having achieved remission: Secondary | ICD-10-CM

## 2022-04-05 DIAGNOSIS — Z5112 Encounter for antineoplastic immunotherapy: Secondary | ICD-10-CM | POA: Diagnosis not present

## 2022-04-05 DIAGNOSIS — Z87442 Personal history of urinary calculi: Secondary | ICD-10-CM | POA: Diagnosis not present

## 2022-04-05 DIAGNOSIS — Z95828 Presence of other vascular implants and grafts: Secondary | ICD-10-CM

## 2022-04-05 DIAGNOSIS — K7460 Unspecified cirrhosis of liver: Secondary | ICD-10-CM | POA: Diagnosis not present

## 2022-04-05 LAB — CBC WITH DIFFERENTIAL/PLATELET
Abs Immature Granulocytes: 0 10*3/uL (ref 0.00–0.07)
Basophils Absolute: 0 10*3/uL (ref 0.0–0.1)
Basophils Relative: 0 %
Eosinophils Absolute: 0.1 10*3/uL (ref 0.0–0.5)
Eosinophils Relative: 3 %
HCT: 27.3 % — ABNORMAL LOW (ref 36.0–46.0)
Hemoglobin: 7.9 g/dL — ABNORMAL LOW (ref 12.0–15.0)
Immature Granulocytes: 0 %
Lymphocytes Relative: 39 %
Lymphs Abs: 1.1 10*3/uL (ref 0.7–4.0)
MCH: 28.2 pg (ref 26.0–34.0)
MCHC: 28.9 g/dL — ABNORMAL LOW (ref 30.0–36.0)
MCV: 97.5 fL (ref 80.0–100.0)
Monocytes Absolute: 0.4 10*3/uL (ref 0.1–1.0)
Monocytes Relative: 13 %
Neutro Abs: 1.2 10*3/uL — ABNORMAL LOW (ref 1.7–7.7)
Neutrophils Relative %: 45 %
Platelets: 115 10*3/uL — ABNORMAL LOW (ref 150–400)
RBC: 2.8 MIL/uL — ABNORMAL LOW (ref 3.87–5.11)
RDW: 20.4 % — ABNORMAL HIGH (ref 11.5–15.5)
WBC: 2.8 10*3/uL — ABNORMAL LOW (ref 4.0–10.5)
nRBC: 0 % (ref 0.0–0.2)

## 2022-04-05 LAB — BPAM RBC
Blood Product Expiration Date: 202402082359
Blood Product Expiration Date: 202402122359
ISSUE DATE / TIME: 202401051149
Unit Type and Rh: 6200
Unit Type and Rh: 6200

## 2022-04-05 LAB — TYPE AND SCREEN
ABO/RH(D): A POS
Antibody Screen: POSITIVE
Unit division: 0
Unit division: 0

## 2022-04-05 LAB — COMPREHENSIVE METABOLIC PANEL
ALT: 14 U/L (ref 0–44)
AST: 18 U/L (ref 15–41)
Albumin: 3.2 g/dL — ABNORMAL LOW (ref 3.5–5.0)
Alkaline Phosphatase: 85 U/L (ref 38–126)
Anion gap: 6 (ref 5–15)
BUN: 18 mg/dL (ref 8–23)
CO2: 29 mmol/L (ref 22–32)
Calcium: 8.4 mg/dL — ABNORMAL LOW (ref 8.9–10.3)
Chloride: 102 mmol/L (ref 98–111)
Creatinine, Ser: 0.56 mg/dL (ref 0.44–1.00)
GFR, Estimated: >60 mL/min (ref >60)
Glucose, Bld: 109 mg/dL — ABNORMAL HIGH (ref 70–99)
Potassium: 3.6 mmol/L (ref 3.5–5.1)
Sodium: 137 mmol/L (ref 135–145)
Total Bilirubin: 0.4 mg/dL (ref 0.3–1.2)
Total Protein: 5.7 g/dL — ABNORMAL LOW (ref 6.5–8.1)

## 2022-04-05 LAB — SAMPLE TO BLOOD BANK

## 2022-04-05 LAB — MAGNESIUM: Magnesium: 1.8 mg/dL (ref 1.7–2.4)

## 2022-04-05 MED ORDER — CYANOCOBALAMIN 1000 MCG/ML IJ SOLN
1000.0000 ug | Freq: Once | INTRAMUSCULAR | Status: AC
  Administered 2022-04-05: 1000 ug via INTRAMUSCULAR
  Filled 2022-04-05: qty 1

## 2022-04-05 MED ORDER — LORATADINE 10 MG PO TABS
10.0000 mg | ORAL_TABLET | Freq: Once | ORAL | Status: AC
  Administered 2022-04-05: 10 mg via ORAL
  Filled 2022-04-05: qty 1

## 2022-04-05 MED ORDER — DEXAMETHASONE 4 MG PO TABS
20.0000 mg | ORAL_TABLET | Freq: Once | ORAL | Status: AC
  Administered 2022-04-05: 20 mg via ORAL
  Filled 2022-04-05: qty 5

## 2022-04-05 MED ORDER — DARATUMUMAB-HYALURONIDASE-FIHJ 1800-30000 MG-UT/15ML ~~LOC~~ SOLN
1800.0000 mg | Freq: Once | SUBCUTANEOUS | Status: AC
  Administered 2022-04-05: 1800 mg via SUBCUTANEOUS
  Filled 2022-04-05: qty 15

## 2022-04-05 MED ORDER — SODIUM CHLORIDE 0.9% FLUSH
10.0000 mL | INTRAVENOUS | Status: DC | PRN
  Administered 2022-04-05: 10 mL via INTRAVENOUS

## 2022-04-05 MED ORDER — DEXAMETHASONE 4 MG PO TABS: 20.0000 mg | ORAL_TABLET | ORAL | 3 refills | Status: DC

## 2022-04-05 MED ORDER — ACETAMINOPHEN 325 MG PO TABS
650.0000 mg | ORAL_TABLET | Freq: Once | ORAL | Status: AC
  Administered 2022-04-05: 650 mg via ORAL
  Filled 2022-04-05: qty 2

## 2022-04-05 MED ORDER — DIPHENHYDRAMINE HCL 25 MG PO CAPS: 50.0000 mg | ORAL_CAPSULE | Freq: Once | ORAL | Status: DC

## 2022-04-05 NOTE — Patient Instructions (Addendum)
[facility] at [facility] Discharge Instructions   You were seen and examined today by Dr. Delton Coombes.  He reviewed the results of your lab work. Your hemoglobin is 7.9 today. We will plan to give you iron every 2 weeks to help with your hemoglobin. Your kidney and liver numbers are pending.   Continue Revlimid as prescribed.   Continue taking dexamethasone (steroid) once a week, even on the days that you do not come to the clinic.   We will proceed with your treatment today.   Return as scheduled.    Thank you for choosing [facility] at [facility] to provide your oncology and hematology care.  To afford each patient quality time with our provider, please arrive at least 15 minutes before your scheduled appointment time.   If you have a lab appointment with the [facility] please come in thru the Main Entrance and check in at the main information desk.  You need to re-schedule your appointment should you arrive 10 or more minutes late.  We strive to give you quality time with our providers, and arriving late affects you and other patients whose appointments are after yours.  Also, if you no show three or more times for appointments you may be dismissed from the clinic at the providers discretion.     Again, thank you for choosing [facility].  Our hope is that these requests will decrease the amount of time that you wait before being seen by our physicians.       _____________________________________________________________  Should you have questions after your visit to [facility], please contact our office at [phone] and follow the prompts.  Our office hours are 8:00 a.m. and 4:30 p.m. Monday - Friday.  Please note that voicemails left after 4:00 p.m. may not be returned until the following business day.  We are closed weekends and major holidays.  You do have access to a nurse 24-7, just call the main number  to the clinic [phone] and do not press any options, hold on the line and a nurse will answer the phone.    For prescription refill requests, have your pharmacy contact our office and allow 72 hours.    Due to Covid, you will need to wear a mask upon entering the hospital. If you do not have a mask, a mask will be given to you at the Main Entrance upon arrival. For doctor visits, patients may have 1 support person age 53 or older with them. For treatment visits, patients can not have anyone with them due to social distancing guidelines and our immunocompromised population.

## 2022-04-05 NOTE — Progress Notes (Signed)
Patient has been examined by Dr. Delton Coombes, and vital signs and labs have been reviewed. ANC, Creatinine, LFTs, hemoglobin (7.9), and platelets are within treatment parameters per M.D. - pt may proceed with treatment.  Primary RN and pharmacy notified.

## 2022-04-05 NOTE — Progress Notes (Signed)
Discontinue diphenhydramine from oncology treatment plan --> Add Loratidine 10 mg po x 1 as premedication for oncology treatment plan.  T.O. Dr Rhys Martini, PharmD

## 2022-04-05 NOTE — Progress Notes (Signed)
Patients port flushed without difficulty.  Good blood return noted with no bruising or swelling noted at site. Patient remains accessed.  

## 2022-04-05 NOTE — Progress Notes (Signed)
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  PRIOR THERAPY: None  NGS Results: Not applicable  CURRENT THERAPY: Daratumumab, lenalidomide and dexamethasone  BRIEF ONCOLOGIC HISTORY:  Oncology History  Multiple myeloma ([place])  11/30/2021 Initial Diagnosis   Multiple myeloma ([employer])   11/30/2021 Cancer Staging   Staging form: Plasma Cell Myeloma and Plasma Cell Disorders, AJCC 8th Edition - Clinical stage from 11/30/2021: RISS Stage II (Beta-2-microglobulin (mg/L): 2.8, Albumin (g/dL): 3, ISS: Stage II, High-risk cytogenetics: Absent, LDH: Normal) - Signed by Derek Jack, MD on 11/30/2021 Histopathologic type: Multiple myeloma Stage prefix: Initial diagnosis Beta 2 microglobulin range (mg/L): Less than 3.5 Albumin range (g/dL): Less than 3.5 Cytogenetics: t(11;14) translocation Serum calcium level: Normal   12/14/2021 -  Chemotherapy   Patient is on Treatment Plan : MYELOMA  Daratumumab SQ + Lenalidomide + Dexamethasone (DaraRd) q28d       CANCER STAGING:  Cancer Staging  Multiple myeloma ([place]) Staging form: Plasma Cell Myeloma and Plasma Cell Disorders, AJCC 8th Edition - Clinical stage from 11/30/2021: RISS Stage II (Beta-2-microglobulin (mg/L): 2.8, Albumin (g/dL): 3, ISS: Stage II, High-risk cytogenetics: Absent, LDH: Normal) - Signed by Derek Jack, MD on 11/30/2021    INTERVAL HISTORY:  Ms. Zakrzewski 72 y.o. female seen for follow-up of multiple myeloma.  Reports energy levels of 25%.  Reports memory problems in the last 3 to 4 weeks.  Denies any bleeding per rectum or melena.  She felt that her energy levels improved after blood transfusion on 04/01/2022.  REVIEW OF SYSTEMS:  Review of Systems  Constitutional:  Positive for fatigue.   Respiratory:  Positive for shortness of breath (On exertion).   Cardiovascular:  Positive for leg swelling.  All other systems reviewed and are negative.    PAST MEDICAL/SURGICAL HISTORY:  Past Medical History:  Diagnosis Date   Adrenal adenoma, left    Stable   Anxiety    Arthritis    bilateral hands   Depression    Diabetes mellitus, type 2 ([facility]) 08/12/2008   Qualifier: Diagnosis of  By: Deborra Medina MD, Talia     Dyspnea    Esophageal varices (HCC)    Grade II diastolic dysfunction    History of kidney stones    Hyperlipidemia    Hypertension    Lower back pain    Lower GI bleed 03/19/2020   Panic attacks    Pneumonia    currently taking antibiotic and prednisone for early stages of pneumonia   Pulmonary nodules    bilateral   Skin cancer    face   Past Surgical History:  Procedure Laterality Date   BIOPSY  04/07/2020   Procedure: BIOPSY;  Surgeon: Eloise Harman, DO;  [facility];  Service: Endoscopy;;   Breast Cystectomy  Right    CESAREAN SECTION     COLONOSCOPY WITH PROPOFOL N/A 01/25/2020   Dr. Abbey Chatters: Nonbleeding internal hemorrhoids, diverticulosis, 5 mm polyp removed from the ascending colon, 10 mm polyp removed from the sigmoid colon, 30 mm polyp (tubulovillous adenoma with no high-grade dysplasia) removed from the transverse colon via piecemeal status post tattoo.  Other polyps were tubular adenomas.  3 month surveillance colonoscopy recommended.   COLONOSCOPY WITH PROPOFOL N/A 04/07/2020   Procedure: COLONOSCOPY WITH PROPOFOL;  Surgeon: Eloise Harman, DO;  [facility];  Service: Endoscopy;  Laterality: N/A;  3:00pm, pt knows new time per office   CYSTOSCOPY/URETEROSCOPY/HOLMIUM LASER/STENT PLACEMENT Bilateral 03/01/2019   Procedure: CYSTOSCOPY/RETROGRADEURETEROSCOPY/HOLMIUM LASER/STENT PLACEMENT;  Surgeon: Ceasar Mons, MD;  Location: [facility];  Service: Urology;  Laterality: Bilateral;  ONLY NEEDS 60 MIN    ESOPHAGOGASTRODUODENOSCOPY (EGD) WITH PROPOFOL N/A 01/25/2020   Dr. Abbey Chatters: 4 columns grade 1 esophageal varices   ESOPHAGOGASTRODUODENOSCOPY (EGD) WITH PROPOFOL N/A 05/18/2020   Procedure: ESOPHAGOGASTRODUODENOSCOPY (EGD) WITH PROPOFOL;  Surgeon: Eloise Harman, DO;  [facility];  Service: Endoscopy;  Laterality: N/A;   ESOPHAGOGASTRODUODENOSCOPY (EGD) WITH PROPOFOL N/A 07/28/2020   Procedure: ESOPHAGOGASTRODUODENOSCOPY (EGD) WITH PROPOFOL;  Surgeon: Eloise Harman, DO;  [facility];  Service: Endoscopy;  Laterality: N/A;  am or early PM due to givens capsule placement   GIVENS CAPSULE STUDY N/A 05/18/2020   Procedure: [place];  Surgeon: Harvel Quale, MD;  [facility];  Service: Gastroenterology;  Laterality: N/A;   GIVENS CAPSULE STUDY N/A 07/28/2020   Procedure: GIVENS CAPSULE STUDY;  Surgeon: Eloise Harman, DO;  [facility];  Service: Endoscopy;  Laterality: N/A;   IR IMAGING GUIDED PORT INSERTION  12/02/2021   POLYPECTOMY  01/25/2020   Procedure: POLYPECTOMY;  Surgeon: Eloise Harman, DO;  [facility];  Service: Endoscopy;;   POLYPECTOMY  04/07/2020   Procedure: POLYPECTOMY INTESTINAL;  Surgeon: Eloise Harman, DO;  [facility];  Service: Endoscopy;;   SKIN CANCER EXCISION     Face   SPINE SURGERY     SUBMUCOSAL TATTOO INJECTION  01/25/2020   Procedure: SUBMUCOSAL TATTOO INJECTION;  Surgeon: Eloise Harman, DO;  [facility];  Service: Endoscopy;;     SOCIAL HISTORY:  Social History   Socioeconomic History   Marital status: Widowed    Spouse name: Not on file   Number of children: 2   Years of education: 14   Highest education level: Not on file  Occupational History   Occupation: Retired   Tobacco Use   Smoking status: Former    Packs/day: 1.50    Years: 40.00    Total pack years: 60.00    Types: Cigarettes    Quit date: 04/29/2015    Years since  quitting: 6.9   Smokeless tobacco: Never   Tobacco comments:    Quit smoking 04/2015- Previous 1.5 ppd smoker  Vaping Use   Vaping Use: Never used  Substance and Sexual Activity   Alcohol use: No    Alcohol/week: 0.0 standard drinks of alcohol   Drug use: No   Sexual activity: Not Currently    Birth control/protection: Post-menopausal  Other Topics Concern   Not on file  Social History Narrative   Her 29 year old granddaughter lives with her - one daughter lives nearby, but she doesn't have a good relationship with her. Has a great relationship with other daughter who lives 1.5 hrs away - talks to her daily on the phone.   Social Determinants of Health   Financial Resource Strain: Medium Risk (07/27/2021)   Overall Financial Resource Strain (CARDIA)    Difficulty of Paying Living Expenses: Somewhat hard  Food Insecurity: Food Insecurity Present (07/27/2021)   Hunger Vital Sign    Worried About Running Out of Food in the Last Year: Sometimes true    Ran Out of Food in the Last Year: Never true  Transportation Needs: No Transportation Needs (07/27/2021)   PRAPARE - Hydrologist (Medical): No    Lack of Transportation (Non-Medical): No  Physical Activity: Inactive (07/27/2021)   Exercise Vital Sign    Days of Exercise per Week: 0 days    Minutes of Exercise per Session: 0 min  Stress: Stress Concern Present (07/27/2021)   [place]    Feeling of Stress : To some extent  Social Connections: Socially Isolated (07/27/2021)   Social Connection and Isolation Panel [NHANES]    Frequency of Communication with Friends and Family: More than three times a week    Frequency of Social Gatherings with Friends and Family: Once a week    Attends Religious Services: Never    Marine scientist or Organizations: No    Attends Archivist Meetings: Never    Marital Status: Widowed  Intimate Partner  Violence: Not At Risk (07/27/2021)   Humiliation, Afraid, Rape, and Kick questionnaire    Fear of Current or Ex-Partner: No    Emotionally Abused: No    Physically Abused: No    Sexually Abused: No    FAMILY HISTORY:  Family History  Problem Relation Age of Onset   Diabetes Father    Heart disease Father 59       MI   Hypertension Father    Anemia Mother        Transfusion dependent   COPD Sister    Cancer Paternal Grandmother 41       Pancreatic    CURRENT MEDICATIONS:  Outpatient Encounter Medications as of 04/05/2022  Medication Sig Note   acyclovir (ZOVIRAX) 400 MG tablet Take 1 tablet (400 mg total) by mouth 2 (two) times daily.    baclofen (LIORESAL) 10 MG tablet Take 10 mg by mouth 2 (two) times daily.    Blood Glucose Calibration (TRUE METRIX LEVEL 1) Low SOLN Use w/ glucose monitor Dx E11.9    Blood Glucose Monitoring Suppl (TRUE METRIX AIR GLUCOSE METER) w/Device KIT Check BS daily Dx E11.9    carvedilol (COREG) 6.25 MG tablet Take 6.25 mg by mouth 2 (two) times daily.    Cholecalciferol (VITAMIN D) 50 MCG (2000 UT) tablet Take 2,000 Units by mouth daily.    clobetasol (TEMOVATE) 0.05 % external solution Apply topically.    CONSTULOSE 10 GM/15ML solution Take by mouth.    Cyanocobalamin (B-12 COMPLIANCE INJECTION) 1000 MCG/ML KIT Inject 1,000 mcg as directed every 30 (thirty) days.    daratumumab-hyaluronidase-fihj (DARZALEX FASPRO) 1800-30000 MG-UT/15ML SOLN Inject 1,800 mg into the skin once a week.    desvenlafaxine (PRISTIQ) 100 MG 24 hr tablet Take 1 tablet (100 mg total) by mouth daily.    dexamethasone (DECADRON) 4 MG tablet Take 5 tablets (20 mg total) by mouth once a week.    esomeprazole (NEXIUM) 20 MG capsule Take 1 capsule (20 mg total) by mouth daily at 12 noon.    ferrous sulfate 325 (65 FE) MG tablet Take 1 tablet (325 mg total) by mouth daily with breakfast.    fluticasone (CUTIVATE) 0.05 % cream Apply 1 Application topically daily.    furosemide (LASIX)  20 MG tablet Take 20 mg by mouth. bid    glucose blood (TRUE METRIX BLOOD GLUCOSE TEST) test strip Check BS daily Dx E11.9    lenalidomide (REVLIMID) 15 MG capsule TAKE 1 CAPSULE BY MOUTH EVERY DAY FOR 21 DAYS ON THEN 7 DAYS OFF  lidocaine-prilocaine (EMLA) cream Apply 1 Application topically as needed. Apply a small amount to port a cath site and cover with plastic wrap 1 hour prior to infusion appointments    lovastatin (MEVACOR) 20 MG tablet Take 1 tablet (20 mg total) by mouth at bedtime.    omeprazole (PRILOSEC) 20 MG capsule Take 20 mg by mouth 2 (two) times daily.    oxyCODONE-acetaminophen (PERCOCET) 10-325 MG tablet Take 1 tablet by mouth every 6 (six) hours as needed for pain.    OXYGEN Inhale 3 L into the lungs continuous. 07/27/2021: Now using 5lpm when walking   OZEMPIC, 0.25 OR 0.5 MG/DOSE, 2 MG/3ML SOPN Inject into the skin.    potassium chloride SA (KLOR-CON) 20 MEQ tablet Take 2 tablets (40 mEq total) by mouth 2 (two) times daily. (Patient taking differently: Take 40 mEq by mouth daily.)    prochlorperazine (COMPAZINE) 10 MG tablet Take 1 tablet (10 mg total) by mouth every 6 (six) hours as needed for nausea or vomiting.    torsemide (DEMADEX) 20 MG tablet Take 3 tablets (60 mg total) by mouth 2 (two) times daily. (Patient taking differently: Take 50 mg by mouth 2 (two) times daily.)    TRUEplus Lancets 33G MISC Check BS daily Dx E11.9    [DISCONTINUED] albuterol (PROVENTIL HFA;VENTOLIN HFA) 108 (90 Base) MCG/ACT inhaler Inhale 2 puffs into the lungs every 6 (six) hours as needed for wheezing or shortness of breath.    Facility-Administered Encounter Medications as of 04/05/2022  Medication   sodium chloride flush (NS) 0.9 % injection 10 mL   sodium chloride flush (NS) 0.9 % injection 10 mL    ALLERGIES:  Allergies  Allergen Reactions   Keflex [Cephalexin] Nausea And Vomiting     PHYSICAL EXAM:  ECOG Performance status: 1  There were no vitals filed for this  visit. There were no vitals filed for this visit. Physical Exam Vitals reviewed.  Constitutional:      Appearance: Normal appearance.  Cardiovascular:     Rate and Rhythm: Normal rate and regular rhythm.     Heart sounds: Normal heart sounds.  Pulmonary:     Effort: Pulmonary effort is normal.     Breath sounds: Normal breath sounds.  Abdominal:     Palpations: Abdomen is soft.  Neurological:     Mental Status: She is alert.  Psychiatric:        Mood and Affect: Mood normal.        Behavior: Behavior normal.     LABORATORY DATA:  I have reviewed the labs as listed.  CBC    Component Value Date/Time   WBC 2.8 (L) 04/05/2022 1213   RBC 2.80 (L) 04/05/2022 1213   HGB 7.9 (L) 04/05/2022 1213   HGB 9.1 (L) 02/26/2021 1129   HCT 27.3 (L) 04/05/2022 1213   HCT 30.5 (L) 02/26/2021 1129   PLT 115 (L) 04/05/2022 1213   PLT 104 (L) 02/26/2021 1129   MCV 97.5 04/05/2022 1213   MCV 87 02/26/2021 1129   MCH 28.2 04/05/2022 1213   MCHC 28.9 (L) 04/05/2022 1213   RDW 20.4 (H) 04/05/2022 1213   RDW 16.4 (H) 02/26/2021 1129   LYMPHSABS 1.1 04/05/2022 1213   LYMPHSABS 1.8 06/24/2020 1529   MONOABS 0.4 04/05/2022 1213   EOSABS 0.1 04/05/2022 1213   EOSABS 0.1 06/24/2020 1529   BASOSABS 0.0 04/05/2022 1213   BASOSABS 0.0 06/24/2020 1529      Latest Ref Rng & Units 03/08/2022  9:26 AM 03/01/2022   11:28 AM 02/08/2022   10:19 AM  CMP  Glucose 70 - 99 mg/dL 159  211  129   BUN 8 - 23 mg/dL '16  17  12   '$ Creatinine 0.44 - 1.00 mg/dL 0.57  0.62  0.58   Sodium 135 - 145 mmol/L 138  139  138   Potassium 3.5 - 5.1 mmol/L 4.2  3.4  3.7   Chloride 98 - 111 mmol/L 105  102  106   CO2 22 - 32 mmol/L '27  28  27   '$ Calcium 8.9 - 10.3 mg/dL 8.5  8.5  8.1   Total Protein 6.5 - 8.1 g/dL 5.9  6.0  5.7   Total Bilirubin 0.3 - 1.2 mg/dL 0.2  0.2  0.4   Alkaline Phos 38 - 126 U/L 97  90  83   AST 15 - 41 U/L '17  16  15   '$ ALT 0 - 44 U/L '19  15  18     '$ DIAGNOSTIC IMAGING:  I have  independently reviewed the scans and discussed with the patient.  ASSESSMENT: 1.  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  Orderly maturation of erythroid and myeloid series. - Myeloma FISH panel: t(11;14) - Cytogenetics: Failed to grow metaphases - PET scan (11/18/2021): No evidence of active myeloma or plasmacytoma.  Right upper lobe lung infection. - Skull x-rays (10/29/2021) multiple discrete round lucent/lytic lesions in the skull - Worsening M spike and free light chain ratio.  Normal creatinine and calcium.  Multifactorial anemia including blood loss and bone marrow infiltration. - Cycle 1 of DRd started on 12/14/2021 with lenalidomide 15 mg 3 weeks on/1 week off and dexamethasone 20 mg weekly   2.  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  Quit smoking 15 years ago.  No exposure to chemicals or pesticides. - Paternal grandmother had ovarian cancer.  Sister had ovarian cancer.  3.  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  Seen by transplant hepatology service at [facility].     PLAN:   1.  Stage II IgG kappa myeloma, standard risk: - Last myeloma labs on 03/04/2022 with M spike 0.4 g and free light chain ratio 4.  6 9. - We have sent myeloma labs from today. - Reviewed labs today which showed normal creatinine and calcium levels.  CBC shows mild leukopenia and thrombocytopenia consistent with treatments. - Reports remembering things for the last 4 weeks.  Forgot to take her premeds today.  Will closely monitor. - Continue Revlimid 15 mg 3 weeks on/1 week off.  Continue dexamethasone 20 mg daily. - Continue Darzalex every 2 weeks. - RTC 4 weeks for follow-up.   2.  Iron deficiency anemia from chronic GI bleed: - She received 1 unit PRBC on 04/01/2022 for hemoglobin 7.4. - Last Venofer 300 mg on  03/22/2022.  Ferritin is 66 and percent saturation 9 on 03/31/2022 indicating continuing blood loss. - We will give her Venofer 400 mg IV on 04/06/2022-schedule her Venofer 400 mg on a standing order every 2 weeks.   3.  Infection prophylaxis: - Continue acyclovir 400 mg twice daily. - Continue to hold aspirin due to history of GI bleed.   4.  Cirrhosis: - Continue Lasix 20 mg daily as needed, carvedilol 6.25 mg daily and lactulose 30 mL  daily.    Orders placed this encounter:  Orders Placed This Encounter  Procedures   Kappa/lambda light chains   Protein electrophoresis, serum       Derek Jack, MD [place] [phone]

## 2022-04-06 LAB — KAPPA/LAMBDA LIGHT CHAINS
Kappa free light chain: 29.9 mg/L — ABNORMAL HIGH (ref 3.3–19.4)
Kappa, lambda light chain ratio: 3.6 — ABNORMAL HIGH (ref 0.26–1.65)
Lambda free light chains: 8.3 mg/L (ref 5.7–26.3)

## 2022-04-07 LAB — PROTEIN ELECTROPHORESIS, SERUM
A/G Ratio: 1.7 (ref 0.7–1.7)
Albumin ELP: 3.4 g/dL (ref 2.9–4.4)
Alpha-1-Globulin: 0.2 g/dL (ref 0.0–0.4)
Alpha-2-Globulin: 0.5 g/dL (ref 0.4–1.0)
Beta Globulin: 0.7 g/dL (ref 0.7–1.3)
Gamma Globulin: 0.5 g/dL (ref 0.4–1.8)
Globulin, Total: 2 g/dL — ABNORMAL LOW (ref 2.2–3.9)
M-Spike, %: 0.3 g/dL — ABNORMAL HIGH
Total Protein ELP: 5.4 g/dL — ABNORMAL LOW (ref 6.0–8.5)

## 2022-04-08 ENCOUNTER — Inpatient Hospital Stay: Payer: Medicare PPO

## 2022-04-08 VITALS — BP 104/50 | HR 66 | Temp 98.2°F | Resp 18

## 2022-04-08 DIAGNOSIS — C9000 Multiple myeloma not having achieved remission: Secondary | ICD-10-CM | POA: Diagnosis not present

## 2022-04-08 DIAGNOSIS — D696 Thrombocytopenia, unspecified: Secondary | ICD-10-CM | POA: Diagnosis not present

## 2022-04-08 DIAGNOSIS — I10 Essential (primary) hypertension: Secondary | ICD-10-CM | POA: Diagnosis not present

## 2022-04-08 DIAGNOSIS — Z87442 Personal history of urinary calculi: Secondary | ICD-10-CM | POA: Diagnosis not present

## 2022-04-08 DIAGNOSIS — K7460 Unspecified cirrhosis of liver: Secondary | ICD-10-CM | POA: Diagnosis not present

## 2022-04-08 DIAGNOSIS — E785 Hyperlipidemia, unspecified: Secondary | ICD-10-CM | POA: Diagnosis not present

## 2022-04-08 DIAGNOSIS — Z5112 Encounter for antineoplastic immunotherapy: Secondary | ICD-10-CM | POA: Diagnosis not present

## 2022-04-08 DIAGNOSIS — E119 Type 2 diabetes mellitus without complications: Secondary | ICD-10-CM | POA: Diagnosis not present

## 2022-04-08 DIAGNOSIS — D500 Iron deficiency anemia secondary to blood loss (chronic): Secondary | ICD-10-CM

## 2022-04-08 DIAGNOSIS — Z95828 Presence of other vascular implants and grafts: Secondary | ICD-10-CM

## 2022-04-08 MED ORDER — SODIUM CHLORIDE 0.9 % IV SOLN: Freq: Once | INTRAVENOUS | Status: AC

## 2022-04-08 MED ORDER — SODIUM CHLORIDE 0.9 % IV SOLN
400.0000 mg | Freq: Once | INTRAVENOUS | Status: AC
  Administered 2022-04-08: 400 mg via INTRAVENOUS
  Filled 2022-04-08: qty 20

## 2022-04-08 MED ORDER — HEPARIN SOD (PORK) LOCK FLUSH 100 UNIT/ML IV SOLN
500.0000 [IU] | Freq: Once | INTRAVENOUS | Status: AC
  Administered 2022-04-08: 500 [IU] via INTRAVENOUS

## 2022-04-08 MED ORDER — SODIUM CHLORIDE 0.9% FLUSH
10.0000 mL | Freq: Once | INTRAVENOUS | Status: AC
  Administered 2022-04-08: 10 mL via INTRAVENOUS

## 2022-04-08 NOTE — Progress Notes (Signed)
Patient presents today for Venofer infusion, patient reports taking pre-meds at home, pharmacy aware.  Patient tolerated iron infusion with no complaints voiced. Peripheral IV site clean and dry with good blood return noted before and after infusion. Band aid applied. VSS with discharge and left in satisfactory condition with no s/s of distress noted.

## 2022-04-08 NOTE — Patient Instructions (Signed)
[facility]  Discharge Instructions: Thank you for choosing [facility] to provide your oncology and hematology care.  If you have a lab appointment with the Cancer Center, please come in thru the Main Entrance and check in at the main information desk.  Wear comfortable clothing and clothing appropriate for easy access to any Portacath or PICC line.   We strive to give you quality time with your provider. You may need to reschedule your appointment if you arrive late (15 or more minutes).  Arriving late affects you and other patients whose appointments are after yours.  Also, if you miss three or more appointments without notifying the office, you may be dismissed from the clinic at the provider's discretion.      For prescription refill requests, have your pharmacy contact our office and allow 72 hours for refills to be completed.    Today you received the following Venofer, return as scheduled.   To help prevent nausea and vomiting after your treatment, we encourage you to take your nausea medication as directed.  BELOW ARE SYMPTOMS THAT SHOULD BE REPORTED IMMEDIATELY: *FEVER GREATER THAN 100.4 F (38 C) OR HIGHER *CHILLS OR SWEATING *NAUSEA AND VOMITING THAT IS NOT CONTROLLED WITH YOUR NAUSEA MEDICATION *UNUSUAL SHORTNESS OF BREATH *UNUSUAL BRUISING OR BLEEDING *URINARY PROBLEMS (pain or burning when urinating, or frequent urination) *BOWEL PROBLEMS (unusual diarrhea, constipation, pain near the anus) TENDERNESS IN MOUTH AND THROAT WITH OR WITHOUT PRESENCE OF ULCERS (sore throat, sores in mouth, or a toothache) UNUSUAL RASH, SWELLING OR PAIN  UNUSUAL VAGINAL DISCHARGE OR ITCHING   Items with * indicate a potential emergency and should be followed up as soon as possible or go to the Emergency Department if any problems should occur.  Please show the CHEMOTHERAPY ALERT CARD or IMMUNOTHERAPY ALERT CARD at check-in to the Emergency Department and triage  nurse.  Should you have questions after your visit or need to cancel or reschedule your appointment, please contact [facility] [phone]  and follow the prompts.  Office hours are 8:00 a.m. to 4:30 p.m. Monday - Friday. Please note that voicemails left after 4:00 p.m. may not be returned until the following business day.  We are closed weekends and major holidays. You have access to a nurse at all times for urgent questions. Please call the main number to the clinic [phone] and follow the prompts.  For any non-urgent questions, you may also contact your provider using MyChart. We now offer e-Visits for anyone 18 and older to request care online for non-urgent symptoms. For details visit [email or web address].   Also download the MyChart app! Go to the app store, search "MyChart", open the app, select [facility], and log in with your MyChart username and password.   

## 2022-04-08 NOTE — Progress Notes (Signed)
Patient reports taking premedication for Venofer at home per RN.  Henreitta Leber, PharmD

## 2022-04-19 ENCOUNTER — Inpatient Hospital Stay: Payer: Medicare PPO

## 2022-04-19 ENCOUNTER — Other Ambulatory Visit: Payer: Self-pay | Admitting: Hematology

## 2022-04-19 VITALS — BP 109/51 | HR 72 | Temp 98.1°F | Resp 20

## 2022-04-19 VITALS — BP 106/59 | HR 70 | Temp 98.7°F | Resp 20 | Wt 303.6 lb

## 2022-04-19 DIAGNOSIS — E785 Hyperlipidemia, unspecified: Secondary | ICD-10-CM | POA: Diagnosis not present

## 2022-04-19 DIAGNOSIS — C9000 Multiple myeloma not having achieved remission: Secondary | ICD-10-CM | POA: Diagnosis not present

## 2022-04-19 DIAGNOSIS — D500 Iron deficiency anemia secondary to blood loss (chronic): Secondary | ICD-10-CM

## 2022-04-19 DIAGNOSIS — Z95828 Presence of other vascular implants and grafts: Secondary | ICD-10-CM

## 2022-04-19 DIAGNOSIS — D696 Thrombocytopenia, unspecified: Secondary | ICD-10-CM | POA: Diagnosis not present

## 2022-04-19 DIAGNOSIS — K7460 Unspecified cirrhosis of liver: Secondary | ICD-10-CM | POA: Diagnosis not present

## 2022-04-19 DIAGNOSIS — Z87442 Personal history of urinary calculi: Secondary | ICD-10-CM | POA: Diagnosis not present

## 2022-04-19 DIAGNOSIS — Z5112 Encounter for antineoplastic immunotherapy: Secondary | ICD-10-CM | POA: Diagnosis not present

## 2022-04-19 DIAGNOSIS — I10 Essential (primary) hypertension: Secondary | ICD-10-CM | POA: Diagnosis not present

## 2022-04-19 DIAGNOSIS — E119 Type 2 diabetes mellitus without complications: Secondary | ICD-10-CM | POA: Diagnosis not present

## 2022-04-19 LAB — CBC WITH DIFFERENTIAL/PLATELET
Abs Immature Granulocytes: 0.01 10*3/uL (ref 0.00–0.07)
Basophils Absolute: 0 10*3/uL (ref 0.0–0.1)
Basophils Relative: 1 %
Eosinophils Absolute: 0 10*3/uL (ref 0.0–0.5)
Eosinophils Relative: 2 %
HCT: 27.8 % — ABNORMAL LOW (ref 36.0–46.0)
Hemoglobin: 8 g/dL — ABNORMAL LOW (ref 12.0–15.0)
Immature Granulocytes: 1 %
Lymphocytes Relative: 25 %
Lymphs Abs: 0.4 10*3/uL — ABNORMAL LOW (ref 0.7–4.0)
MCH: 28.7 pg (ref 26.0–34.0)
MCHC: 28.8 g/dL — ABNORMAL LOW (ref 30.0–36.0)
MCV: 99.6 fL (ref 80.0–100.0)
Monocytes Absolute: 0.1 10*3/uL (ref 0.1–1.0)
Monocytes Relative: 7 %
Neutro Abs: 1 10*3/uL — ABNORMAL LOW (ref 1.7–7.7)
Neutrophils Relative %: 64 %
Platelets: 82 10*3/uL — ABNORMAL LOW (ref 150–400)
RBC: 2.79 MIL/uL — ABNORMAL LOW (ref 3.87–5.11)
RDW: 18.9 % — ABNORMAL HIGH (ref 11.5–15.5)
WBC: 1.6 10*3/uL — ABNORMAL LOW (ref 4.0–10.5)
nRBC: 0 % (ref 0.0–0.2)

## 2022-04-19 LAB — SAMPLE TO BLOOD BANK

## 2022-04-19 MED ORDER — SODIUM CHLORIDE 0.9% FLUSH
10.0000 mL | INTRAVENOUS | Status: DC | PRN
  Administered 2022-04-19: 10 mL via INTRAVENOUS

## 2022-04-19 MED ORDER — SODIUM CHLORIDE 0.9 % IV SOLN: Freq: Once | INTRAVENOUS | Status: AC

## 2022-04-19 MED ORDER — HEPARIN SOD (PORK) LOCK FLUSH 100 UNIT/ML IV SOLN
500.0000 [IU] | Freq: Once | INTRAVENOUS | Status: AC
  Administered 2022-04-19: 500 [IU] via INTRAVENOUS

## 2022-04-19 MED ORDER — SODIUM CHLORIDE 0.9 % IV SOLN
300.0000 mg | Freq: Once | INTRAVENOUS | Status: AC
  Administered 2022-04-19: 300 mg via INTRAVENOUS
  Filled 2022-04-19: qty 300

## 2022-04-19 MED ORDER — DARATUMUMAB-HYALURONIDASE-FIHJ 1800-30000 MG-UT/15ML ~~LOC~~ SOLN: 1800.0000 mg | Freq: Once | SUBCUTANEOUS | Status: DC

## 2022-04-19 NOTE — Patient Instructions (Signed)
[facility]  Discharge Instructions: Thank you for choosing [facility] to provide your oncology and hematology care.  If you have a lab appointment with the [facility], please come in thru the Main Entrance and check in at the main information desk.  Wear comfortable clothing and clothing appropriate for easy access to any Portacath or PICC line.   We strive to give you quality time with your provider. You may need to reschedule your appointment if you arrive late (15 or more minutes).  Arriving late affects you and other patients whose appointments are after yours.  Also, if you miss three or more appointments without notifying the office, you may be dismissed from the clinic at the provider's discretion.      For prescription refill requests, have your pharmacy contact our office and allow 72 hours for refills to be completed.    Today you received our iron infusion   To help prevent nausea and vomiting after your treatment, we encourage you to take your nausea medication as directed.  BELOW ARE SYMPTOMS THAT SHOULD BE REPORTED IMMEDIATELY: *FEVER GREATER THAN 100.4 F (38 C) OR HIGHER *CHILLS OR SWEATING *NAUSEA AND VOMITING THAT IS NOT CONTROLLED WITH YOUR NAUSEA MEDICATION *UNUSUAL SHORTNESS OF BREATH *UNUSUAL BRUISING OR BLEEDING *URINARY PROBLEMS (pain or burning when urinating, or frequent urination) *BOWEL PROBLEMS (unusual diarrhea, constipation, pain near the anus) TENDERNESS IN MOUTH AND THROAT WITH OR WITHOUT PRESENCE OF ULCERS (sore throat, sores in mouth, or a toothache) UNUSUAL RASH, SWELLING OR PAIN  UNUSUAL VAGINAL DISCHARGE OR ITCHING   Items with * indicate a potential emergency and should be followed up as soon as possible or go to the Emergency Department if any problems should occur.  Please show the CHEMOTHERAPY ALERT CARD or IMMUNOTHERAPY ALERT CARD at check-in to the Emergency Department and triage nurse.  Should you have  questions after your visit or need to cancel or reschedule your appointment, please contact [facility] [phone]  and follow the prompts.  Office hours are 8:00 a.m. to 4:30 p.m. Monday - Friday. Please note that voicemails left after 4:00 p.m. may not be returned until the following business day.  We are closed weekends and major holidays. You have access to a nurse at all times for urgent questions. Please call the main number to the clinic [phone] and follow the prompts.  For any non-urgent questions, you may also contact your provider using MyChart. We now offer e-Visits for anyone 13 and older to request care online for non-urgent symptoms. For details visit [email or web address].   Also download the MyChart app! Go to the app store, search "MyChart", open the app, select [facility], and log in with your MyChart username and password.

## 2022-04-19 NOTE — Progress Notes (Signed)
Labs reviewed with MD, will hold treatment today per MD, will give venofer 300 mg today per orders.   Iron infusion given per orders. Patient tolerated it well without problems. Vitals stable and discharged home from clinic via wheelchair. Follow up as scheduled.

## 2022-04-19 NOTE — Progress Notes (Signed)
Patients port flushed without difficulty.  Good blood return noted with no bruising or swelling noted at site.  Patient remains accessed for any possible treatment.

## 2022-04-21 ENCOUNTER — Other Ambulatory Visit: Payer: Self-pay

## 2022-04-21 DIAGNOSIS — C9000 Multiple myeloma not having achieved remission: Secondary | ICD-10-CM

## 2022-04-21 MED ORDER — LENALIDOMIDE 15 MG PO CAPS: ORAL_CAPSULE | ORAL | 0 refills | Status: DC

## 2022-04-21 NOTE — Telephone Encounter (Signed)
Chart reviewed. Revlimid refilled per last office note with Dr. Katragadda.  

## 2022-04-26 ENCOUNTER — Inpatient Hospital Stay: Payer: Medicare PPO

## 2022-04-26 DIAGNOSIS — E785 Hyperlipidemia, unspecified: Secondary | ICD-10-CM | POA: Diagnosis not present

## 2022-04-26 DIAGNOSIS — I10 Essential (primary) hypertension: Secondary | ICD-10-CM | POA: Diagnosis not present

## 2022-04-26 DIAGNOSIS — C9000 Multiple myeloma not having achieved remission: Secondary | ICD-10-CM

## 2022-04-26 DIAGNOSIS — K7460 Unspecified cirrhosis of liver: Secondary | ICD-10-CM | POA: Diagnosis not present

## 2022-04-26 DIAGNOSIS — D696 Thrombocytopenia, unspecified: Secondary | ICD-10-CM | POA: Diagnosis not present

## 2022-04-26 DIAGNOSIS — Z5112 Encounter for antineoplastic immunotherapy: Secondary | ICD-10-CM | POA: Diagnosis not present

## 2022-04-26 DIAGNOSIS — Z87442 Personal history of urinary calculi: Secondary | ICD-10-CM | POA: Diagnosis not present

## 2022-04-26 DIAGNOSIS — D500 Iron deficiency anemia secondary to blood loss (chronic): Secondary | ICD-10-CM

## 2022-04-26 DIAGNOSIS — E119 Type 2 diabetes mellitus without complications: Secondary | ICD-10-CM | POA: Diagnosis not present

## 2022-04-26 LAB — CBC WITH DIFFERENTIAL/PLATELET
Abs Immature Granulocytes: 0.01 10*3/uL (ref 0.00–0.07)
Basophils Absolute: 0 10*3/uL (ref 0.0–0.1)
Basophils Relative: 1 %
Eosinophils Absolute: 0.1 10*3/uL (ref 0.0–0.5)
Eosinophils Relative: 5 %
HCT: 29.4 % — ABNORMAL LOW (ref 36.0–46.0)
Hemoglobin: 8.7 g/dL — ABNORMAL LOW (ref 12.0–15.0)
Immature Granulocytes: 1 %
Lymphocytes Relative: 25 %
Lymphs Abs: 0.5 10*3/uL — ABNORMAL LOW (ref 0.7–4.0)
MCH: 29.6 pg (ref 26.0–34.0)
MCHC: 29.6 g/dL — ABNORMAL LOW (ref 30.0–36.0)
MCV: 100 fL (ref 80.0–100.0)
Monocytes Absolute: 0.3 10*3/uL (ref 0.1–1.0)
Monocytes Relative: 13 %
Neutro Abs: 1.2 10*3/uL — ABNORMAL LOW (ref 1.7–7.7)
Neutrophils Relative %: 55 %
Platelets: 98 10*3/uL — ABNORMAL LOW (ref 150–400)
RBC: 2.94 MIL/uL — ABNORMAL LOW (ref 3.87–5.11)
RDW: 18.6 % — ABNORMAL HIGH (ref 11.5–15.5)
WBC: 2.1 10*3/uL — ABNORMAL LOW (ref 4.0–10.5)
nRBC: 0 % (ref 0.0–0.2)

## 2022-04-26 LAB — SAMPLE TO BLOOD BANK

## 2022-04-26 MED ORDER — SODIUM CHLORIDE 0.9% FLUSH
10.0000 mL | Freq: Once | INTRAVENOUS | Status: AC
  Administered 2022-04-26: 10 mL via INTRAVENOUS

## 2022-04-26 MED ORDER — DARATUMUMAB-HYALURONIDASE-FIHJ 1800-30000 MG-UT/15ML ~~LOC~~ SOLN
1800.0000 mg | Freq: Once | SUBCUTANEOUS | Status: AC
  Administered 2022-04-26: 1800 mg via SUBCUTANEOUS
  Filled 2022-04-26: qty 15

## 2022-04-26 NOTE — Patient Instructions (Signed)
[facility]  Discharge Instructions: Thank you for choosing [facility] to provide your oncology and hematology care.  If you have a lab appointment with the Cancer Center, please come in thru the Main Entrance and check in at the main information desk.  Wear comfortable clothing and clothing appropriate for easy access to any Portacath or PICC line.   We strive to give you quality time with your provider. You may need to reschedule your appointment if you arrive late (15 or more minutes).  Arriving late affects you and other patients whose appointments are after yours.  Also, if you miss three or more appointments without notifying the office, you may be dismissed from the clinic at the provider's discretion.      For prescription refill requests, have your pharmacy contact our office and allow 72 hours for refills to be completed.    Today you received the following chemotherapy and/or immunotherapy agents Daratumumab      To help prevent nausea and vomiting after your treatment, we encourage you to take your nausea medication as directed.  BELOW ARE SYMPTOMS THAT SHOULD BE REPORTED IMMEDIATELY: *FEVER GREATER THAN 100.4 F (38 C) OR HIGHER *CHILLS OR SWEATING *NAUSEA AND VOMITING THAT IS NOT CONTROLLED WITH YOUR NAUSEA MEDICATION *UNUSUAL SHORTNESS OF BREATH *UNUSUAL BRUISING OR BLEEDING *URINARY PROBLEMS (pain or burning when urinating, or frequent urination) *BOWEL PROBLEMS (unusual diarrhea, constipation, pain near the anus) TENDERNESS IN MOUTH AND THROAT WITH OR WITHOUT PRESENCE OF ULCERS (sore throat, sores in mouth, or a toothache) UNUSUAL RASH, SWELLING OR PAIN  UNUSUAL VAGINAL DISCHARGE OR ITCHING   Items with * indicate a potential emergency and should be followed up as soon as possible or go to the Emergency Department if any problems should occur.  Please show the CHEMOTHERAPY ALERT CARD or IMMUNOTHERAPY ALERT CARD at check-in to the  Emergency Department and triage nurse.  Should you have questions after your visit or need to cancel or reschedule your appointment, please contact [facility] [phone]  and follow the prompts.  Office hours are 8:00 a.m. to 4:30 p.m. Monday - Friday. Please note that voicemails left after 4:00 p.m. may not be returned until the following business day.  We are closed weekends and major holidays. You have access to a nurse at all times for urgent questions. Please call the main number to the clinic [phone] and follow the prompts.  For any non-urgent questions, you may also contact your provider using MyChart. We now offer e-Visits for anyone 18 and older to request care online for non-urgent symptoms. For details visit [email or web address].   Also download the MyChart app! Go to the app store, search "MyChart", open the app, select [facility], and log in with your MyChart username and password.   

## 2022-04-26 NOTE — Progress Notes (Signed)
Patient presents today for Daratumumab injection per providers order.  Vital signs within parameters.  Labs pending.  Patient took premedications at home prior to appointment.  ANC noted to be 1.2 and platelets noted to be 98, MD notified.  Message received from Dr. Delton Coombes to proceed with treatment.  Daratumumab administration without incident; injection site WNL; see MAR for injection details.  Patient tolerated procedure well and without incident.  No questions or complaints noted at this time. Discharge from clinic ambulatory in stable condition.  Alert and oriented X 3.  Follow up with [facility] as scheduled.

## 2022-05-02 DIAGNOSIS — E119 Type 2 diabetes mellitus without complications: Secondary | ICD-10-CM | POA: Diagnosis not present

## 2022-05-02 DIAGNOSIS — F1120 Opioid dependence, uncomplicated: Secondary | ICD-10-CM | POA: Diagnosis not present

## 2022-05-02 DIAGNOSIS — R030 Elevated blood-pressure reading, without diagnosis of hypertension: Secondary | ICD-10-CM | POA: Diagnosis not present

## 2022-05-02 DIAGNOSIS — G894 Chronic pain syndrome: Secondary | ICD-10-CM | POA: Diagnosis not present

## 2022-05-02 DIAGNOSIS — C9000 Multiple myeloma not having achieved remission: Secondary | ICD-10-CM | POA: Diagnosis not present

## 2022-05-02 DIAGNOSIS — M5136 Other intervertebral disc degeneration, lumbar region: Secondary | ICD-10-CM | POA: Diagnosis not present

## 2022-05-02 DIAGNOSIS — Z79899 Other long term (current) drug therapy: Secondary | ICD-10-CM | POA: Diagnosis not present

## 2022-05-02 DIAGNOSIS — Z6841 Body Mass Index (BMI) 40.0 and over, adult: Secondary | ICD-10-CM | POA: Diagnosis not present

## 2022-05-03 ENCOUNTER — Ambulatory Visit: Payer: Medicare PPO

## 2022-05-03 ENCOUNTER — Ambulatory Visit: Payer: Medicare PPO | Admitting: Hematology

## 2022-05-03 ENCOUNTER — Other Ambulatory Visit: Payer: Medicare PPO

## 2022-05-06 ENCOUNTER — Encounter: Payer: Self-pay | Admitting: Cardiology

## 2022-05-06 ENCOUNTER — Ambulatory Visit: Payer: Medicare PPO | Attending: Cardiology | Admitting: Cardiology

## 2022-05-06 VITALS — BP 124/56 | HR 75 | Ht 62.0 in | Wt 291.2 lb

## 2022-05-06 DIAGNOSIS — K922 Gastrointestinal hemorrhage, unspecified: Secondary | ICD-10-CM

## 2022-05-06 DIAGNOSIS — D500 Iron deficiency anemia secondary to blood loss (chronic): Secondary | ICD-10-CM

## 2022-05-06 DIAGNOSIS — C9000 Multiple myeloma not having achieved remission: Secondary | ICD-10-CM

## 2022-05-06 DIAGNOSIS — I152 Hypertension secondary to endocrine disorders: Secondary | ICD-10-CM

## 2022-05-06 DIAGNOSIS — E1169 Type 2 diabetes mellitus with other specified complication: Secondary | ICD-10-CM

## 2022-05-06 DIAGNOSIS — I5032 Chronic diastolic (congestive) heart failure: Secondary | ICD-10-CM

## 2022-05-06 DIAGNOSIS — M7989 Other specified soft tissue disorders: Secondary | ICD-10-CM | POA: Diagnosis not present

## 2022-05-06 DIAGNOSIS — R011 Cardiac murmur, unspecified: Secondary | ICD-10-CM | POA: Diagnosis not present

## 2022-05-06 DIAGNOSIS — E1159 Type 2 diabetes mellitus with other circulatory complications: Secondary | ICD-10-CM

## 2022-05-06 DIAGNOSIS — E785 Hyperlipidemia, unspecified: Secondary | ICD-10-CM

## 2022-05-06 NOTE — Addendum Note (Signed)
Addended by: Levonne Hubert on: 05/06/2022 04:44 PM   Modules accepted: Orders

## 2022-05-06 NOTE — Progress Notes (Signed)
Cardiology Clinic Note   Patient Name: Savannah Burns Date of Encounter: 05/06/2022  Primary Care Provider:  [name], DO Primary Cardiologist:  Carlyle Dolly, MD  Patient Profile    72 year old female with a past medical history of chronic diastolic congestive heart failure, COPD on home oxygen, hypertension, hyperlipidemia, type 2 diabetes, mild aortic stenosis, and history of GI bleed, who is here today to follow-up on her chronic diastolic congestive heart failure.  Past Medical History    Past Medical History:  Diagnosis Date   Adrenal adenoma, left    Stable   Anxiety    Arthritis    bilateral hands   Depression    Diabetes mellitus, type 2 ([place]) 08/12/2008   Qualifier: Diagnosis of  By: Deborra Medina MD, Talia     Dyspnea    Esophageal varices (HCC)    Grade II diastolic dysfunction    History of kidney stones    Hyperlipidemia    Hypertension    Lower back pain    Lower GI bleed 03/19/2020   Panic attacks    Pneumonia    currently taking antibiotic and prednisone for early stages of pneumonia   Pulmonary nodules    bilateral   Skin cancer    face   Past Surgical History:  Procedure Laterality Date   BIOPSY  04/07/2020   Procedure: BIOPSY;  Surgeon: Eloise Harman, DO;  [facility];  Service: Endoscopy;;   Breast Cystectomy  Right    CESAREAN SECTION     COLONOSCOPY WITH PROPOFOL N/A 01/25/2020   Dr. Abbey Chatters: Nonbleeding internal hemorrhoids, diverticulosis, 5 mm polyp removed from the ascending colon, 10 mm polyp removed from the sigmoid colon, 30 mm polyp (tubulovillous adenoma with no high-grade dysplasia) removed from the transverse colon via piecemeal status post tattoo.  Other polyps were tubular adenomas.  3 month surveillance colonoscopy recommended.   COLONOSCOPY WITH PROPOFOL N/A 04/07/2020   Procedure: COLONOSCOPY WITH PROPOFOL;  Surgeon: Eloise Harman, DO;  [facility]: [facility];  Service: Endoscopy;  Laterality: N/A;   3:00pm, pt knows new time per office   CYSTOSCOPY/URETEROSCOPY/HOLMIUM LASER/STENT PLACEMENT Bilateral 03/01/2019   Procedure: CYSTOSCOPY/RETROGRADEURETEROSCOPY/HOLMIUM LASER/STENT PLACEMENT;  Surgeon: Ceasar Mons, MD;  Location: [facility];  Service: Urology;  Laterality: Bilateral;  ONLY NEEDS 60 MIN   ESOPHAGOGASTRODUODENOSCOPY (EGD) WITH PROPOFOL N/A 01/25/2020   Dr. Abbey Chatters: 4 columns grade 1 esophageal varices   ESOPHAGOGASTRODUODENOSCOPY (EGD) WITH PROPOFOL N/A 05/18/2020   Procedure: ESOPHAGOGASTRODUODENOSCOPY (EGD) WITH PROPOFOL;  Surgeon: Eloise Harman, DO;  [facility];  Service: Endoscopy;  Laterality: N/A;   ESOPHAGOGASTRODUODENOSCOPY (EGD) WITH PROPOFOL N/A 07/28/2020   Procedure: ESOPHAGOGASTRODUODENOSCOPY (EGD) WITH PROPOFOL;  Surgeon: Eloise Harman, DO;  [facility];  Service: Endoscopy;  Laterality: N/A;  am or early PM due to givens capsule placement   GIVENS CAPSULE STUDY N/A 05/18/2020   Procedure: Page;  Surgeon: Harvel Quale, MD;  [facility]: [facility];  Service: Gastroenterology;  Laterality: N/A;   GIVENS CAPSULE STUDY N/A 07/28/2020   Procedure: GIVENS CAPSULE STUDY;  Surgeon: Eloise Harman, DO;  [facility];  Service: Endoscopy;  Laterality: N/A;   IR IMAGING GUIDED PORT INSERTION  12/02/2021   POLYPECTOMY  01/25/2020   Procedure: POLYPECTOMY;  Surgeon: Eloise Harman, DO;  [facility];  Service: Endoscopy;;   POLYPECTOMY  04/07/2020   Procedure: POLYPECTOMY INTESTINAL;  Surgeon: Eloise Harman, DO;  [facility];  Service: Endoscopy;;   SKIN CANCER EXCISION     Face   SPINE SURGERY     SUBMUCOSAL TATTOO INJECTION  01/25/2020   Procedure: SUBMUCOSAL TATTOO INJECTION;  Surgeon: Eloise Harman, DO;  [facility];  Service: Endoscopy;;    Allergies  Allergies  Allergen Reactions   Keflex [Cephalexin] Nausea And Vomiting    History of Present  Illness    Savannah Burns Is a 72 year old female with previously mentioned past medical history of chronic diastolic congestive heart failure with normal LVEF and G1 DD on echo in 05/07/19, COPD on home oxygen therapy, hypertension, hyperlipidemia, type 2 diabetes, mild aortic stenosis, history of GI bleed.  She was last seen in clinic by Gerrianne Scale, [place] on 02/18/2020.  At her visit was noted to have an increased amount of peripheral edema, she has cut back on her sodium intake.  Had a recent hospitalization with small esophageal varices with no bleeding or normal stomach hemoglobin was 3.7, she was transfused 5 units of PRBCs.  Her weight was down from 308 to 287.  Her Demadex was increased to 60 mg twice daily with potassium increased to 40 mEq twice daily she was to follow-up in 2 weeks but did not keep that appointment and has not been seen since that time.  Since that time patient has had multiple hospitalizations with recurrent GI bleeds.  She has had several colonoscopies and upper endoscopies and capsule studies completed.  She also has been following up with hematology oncology.  Through her workup she was found to have multiple myeloma and is currently undergoing treatment.  She returns to clinic today accompanied by her 2 granddaughters.  She states that considering what she has been going through she has been doing well.  She continued to have to receive blood transfusions until she got a ride to the [facility] emergency department stayed in the hospital for approximately 5 days and was evaluated by GI and they found where she was bleeding in her small intestine.  Since that time she has continued to get blood and iron transfusions as she is currently undergoing treatment for multiple myeloma.  She does endorse fatigue, shortness of breath with dyspnea on exertion and remains on chronic oxygen therapy, she has peripheral edema which is also component of lymphedema as she does have swollen toes to her  bilateral feet.  She denies any chest pain or palpitations.  She also denies any recent hospitalizations or emergency department visits.  She did voice that she is grateful that she saw Maple Hudson, PA back in November she said that she credits her with saving her life for checking her blood and noticing that her hemoglobin was 3.7 and recommending her to go to the emergency department immediately.  Home Medications    Current Outpatient Medications  Medication Sig Dispense Refill   acyclovir (ZOVIRAX) 400 MG tablet Take 1 tablet (400 mg total) by mouth 2 (two) times daily. 60 tablet 6   baclofen (LIORESAL) 10 MG tablet Take 10 mg by mouth 2 (two) times daily.     Blood Glucose Calibration (TRUE METRIX LEVEL 1) Low SOLN Use w/ glucose monitor Dx E11.9 3 each 0   Blood Glucose Monitoring Suppl (TRUE METRIX AIR GLUCOSE METER) w/Device KIT Check BS daily Dx E11.9 1 kit 0   carvedilol (COREG) 6.25 MG tablet Take 6.25 mg by mouth 2 (two) times daily.     Cholecalciferol (VITAMIN D) 50  MCG (2000 UT) tablet Take 2,000 Units by mouth daily.     clobetasol (TEMOVATE) 0.05 % external solution Apply topically.     CONSTULOSE 10 GM/15ML solution Take by mouth.     Cyanocobalamin (B-12 COMPLIANCE INJECTION) 1000 MCG/ML KIT Inject 1,000 mcg as directed every 30 (thirty) days.     daratumumab-hyaluronidase-fihj (DARZALEX FASPRO) 1800-30000 MG-UT/15ML SOLN Inject 1,800 mg into the skin once a week.     desvenlafaxine (PRISTIQ) 100 MG 24 hr tablet Take 1 tablet (100 mg total) by mouth daily. 90 tablet 3   dexamethasone (DECADRON) 4 MG tablet Take 5 tablets (20 mg total) by mouth once a week. 20 tablet 3   esomeprazole (NEXIUM) 20 MG capsule Take 1 capsule (20 mg total) by mouth daily at 12 noon. 90 capsule 3   ferrous sulfate 325 (65 FE) MG tablet Take 1 tablet (325 mg total) by mouth daily with breakfast. 30 tablet 3   fluticasone (CUTIVATE) 0.05 % cream Apply 1 Application topically daily.     glucose blood  (TRUE METRIX BLOOD GLUCOSE TEST) test strip Check BS daily Dx E11.9 100 each 3   lenalidomide (REVLIMID) 15 MG capsule TAKE 1 CAPSULE BY MOUTH EVERY DAY FOR 21 DAYS ON THEN 7 DAYS OFF 21 capsule 0   lidocaine-prilocaine (EMLA) cream Apply 1 Application topically as needed. Apply a small amount to port a cath site and cover with plastic wrap 1 hour prior to infusion appointments 30 g 3   lovastatin (MEVACOR) 20 MG tablet Take 1 tablet (20 mg total) by mouth at bedtime. 90 tablet 3   omeprazole (PRILOSEC) 20 MG capsule Take 20 mg by mouth 2 (two) times daily.     oxyCODONE-acetaminophen (PERCOCET) 10-325 MG tablet Take 1 tablet by mouth every 6 (six) hours as needed for pain.     OXYGEN Inhale 3 L into the lungs continuous.     OZEMPIC, 0.25 OR 0.5 MG/DOSE, 2 MG/3ML SOPN Inject into the skin.     potassium chloride SA (KLOR-CON) 20 MEQ tablet Take 2 tablets (40 mEq total) by mouth 2 (two) times daily. (Patient taking differently: Take 40 mEq by mouth daily.) 360 tablet 3   prochlorperazine (COMPAZINE) 10 MG tablet Take 1 tablet (10 mg total) by mouth every 6 (six) hours as needed for nausea or vomiting. 30 tablet 6   torsemide (DEMADEX) 20 MG tablet Take 3 tablets (60 mg total) by mouth 2 (two) times daily. (Patient taking differently: Take 50 mg by mouth 2 (two) times daily.) 180 tablet 11   TRUEplus Lancets 33G MISC Check BS daily Dx E11.9 100 each 3   furosemide (LASIX) 20 MG tablet Take 20 mg by mouth. bid (Patient not taking: Reported on 05/06/2022)     No current facility-administered medications for this visit.   Facility-Administered Medications Ordered in Other Visits  Medication Dose Route Frequency Provider Last Rate Last Admin   sodium chloride flush (NS) 0.9 % injection 10 mL  10 mL Intravenous PRN Pennington, Rebekah M, PA-C   10 mL at 03/08/22 1344     Family History    Family History  Problem Relation Age of Onset   Diabetes Father    Heart disease Father 69       MI    Hypertension Father    Anemia Mother        Transfusion dependent   COPD Sister    Cancer Paternal Grandmother 5       Pancreatic  She indicated that her mother is deceased. She indicated that her father is deceased. She indicated that her sister is alive. She indicated that her maternal grandmother is deceased. She indicated that her maternal grandfather is deceased. She indicated that her paternal grandmother is deceased. She indicated that her paternal grandfather is deceased. She indicated that both of her daughters are alive.  Social History    Social History   Socioeconomic History   Marital status: Widowed    Spouse name: Not on file   Number of children: 2   Years of education: 14   Highest education level: Not on file  Occupational History   Occupation: Retired   Tobacco Use   Smoking status: Former    Packs/day: 1.50    Years: 40.00    Total pack years: 60.00    Types: Cigarettes    Quit date: 04/29/2015    Years since quitting: 7.0   Smokeless tobacco: Never   Tobacco comments:    Quit smoking 04/2015- Previous 1.5 ppd smoker  Vaping Use   Vaping Use: Never used  Substance and Sexual Activity   Alcohol use: No    Alcohol/week: 0.0 standard drinks of alcohol   Drug use: No   Sexual activity: Not Currently    Birth control/protection: Post-menopausal  Other Topics Concern   Not on file  Social History Narrative   Her 37 year old granddaughter lives with her - one daughter lives nearby, but she doesn't have a good relationship with her. Has a great relationship with other daughter who lives 1.5 hrs away - talks to her daily on the phone.   Social Determinants of Health   Financial Resource Strain: Medium Risk (07/27/2021)   Overall Financial Resource Strain (CARDIA)    Difficulty of Paying Living Expenses: Somewhat hard  Food Insecurity: Food Insecurity Present (07/27/2021)   Hunger Vital Sign    Worried About Running Out of Food in the Last Year: Sometimes true     Ran Out of Food in the Last Year: Never true  Transportation Needs: No Transportation Needs (07/27/2021)   PRAPARE - Hydrologist (Medical): No    Lack of Transportation (Non-Medical): No  Physical Activity: Inactive (07/27/2021)   Exercise Vital Sign    Days of Exercise per Week: 0 days    Minutes of Exercise per Session: 0 min  Stress: Stress Concern Present (07/27/2021)   [place]    Feeling of Stress : To some extent  Social Connections: Socially Isolated (07/27/2021)   Social Connection and Isolation Panel [NHANES]    Frequency of Communication with Friends and Family: More than three times a week    Frequency of Social Gatherings with Friends and Family: Once a week    Attends Religious Services: Never    Marine scientist or Organizations: No    Attends Archivist Meetings: Never    Marital Status: Widowed  Intimate Partner Violence: Not At Risk (07/27/2021)   Humiliation, Afraid, Rape, and Kick questionnaire    Fear of Current or Ex-Partner: No    Emotionally Abused: No    Physically Abused: No    Sexually Abused: No     Review of Systems    General:  No chills, fever, night sweats or weight changes.  Cardiovascular:  No chest pain, dyspnea on exertion, edema, orthopnea, palpitations, paroxysmal nocturnal dyspnea. Dermatological: No rash, lesions/masses Respiratory: No cough, dyspnea Urologic: No  hematuria, dysuria Abdominal:   No nausea, vomiting, diarrhea, bright red blood per rectum, melena, or hematemesis Neurologic:  No visual changes, wkns, changes in mental status. All other systems reviewed and are otherwise negative except as noted above.     Physical Exam    VS:  BP (!) 124/56   Pulse 75   Ht 5' 2"$  (1.575 m)   Wt 291 lb 3.2 oz (132.1 kg)   SpO2 94%   BMI 53.26 kg/m  , BMI Body mass index is 53.26 kg/m.     GEN: Well nourished, well developed,  in no acute distress. HEENT: normal. Neck: Supple, no JVD, carotid bruits, or masses. Cardiac: RRR, II/VI systolic murmur best heard at the RUSB, without rubs, or gallops. No clubbing, cyanosis, 2+ pitting edema.  Radials 2+/PT 2+ and equal bilaterally. Lymphedema to BLE Respiratory:  Respirations regular and unlabored, diminished to auscultation bilaterally. Respirations are unlabored at rest on 3L of O2 via [place] GI: Soft, nontender, obese, nondistended, BS + x 4. MS: no deformity or atrophy. Skin: warm and dry, no rash. Skin to BLE flaky  Neuro:  Strength and sensation are intact. Psych: Normal affect.  Accessory Clinical Findings    ECG personally reviewed by me today-sinus rhythm with a rate of 83, LVH, ST depression in leads II, III, aVR, V4, V5, and V6- No acute changes  Lab Results  Component Value Date   WBC 2.1 (L) 04/26/2022   HGB 8.7 (L) 04/26/2022   HCT 29.4 (L) 04/26/2022   MCV 100.0 04/26/2022   PLT 98 (L) 04/26/2022   Lab Results  Component Value Date   CREATININE 0.56 04/05/2022   BUN 18 04/05/2022   NA 137 04/05/2022   K 3.6 04/05/2022   CL 102 04/05/2022   CO2 29 04/05/2022   Lab Results  Component Value Date   ALT 14 04/05/2022   AST 18 04/05/2022   ALKPHOS 85 04/05/2022   BILITOT 0.4 04/05/2022   Lab Results  Component Value Date   CHOL 122 10/29/2019   HDL 31 (L) 10/29/2019   LDLCALC 76 10/29/2019   TRIG 70 10/29/2019   CHOLHDL 3.9 10/29/2019    Lab Results  Component Value Date   HGBA1C 6.5 (H) 02/25/2022    Assessment & Plan   1.  Chronic diastolic congestive heart failure with continued dyspnea on exertion and chronic edema with lymphedema.  Unfortunately patient is unsure of her medication regimen at this time we are waiting confirmation from pharmacy to determine which diuretic she is taking it.  She has been encouraged to stay on her carvedilol 6.25 mg twice daily.  She is also being scheduled for an echocardiogram for her dyspnea on  exertion and heart murmur found on exam today.   2.  Heart murmur found on exam.  Patient has not been told she has had a heart murmur in the past.  Echocardiogram ordered.  3.  Essential hypertension with blood pressure 124/56.  Currently on carvedilol.  Blood pressure remained stable.  She is encouraged to continue to monitor her blood pressure at home.  4.  Hyperlipidemia with last recorded LDL of 76 in 2021.  Needs updated lipid panel on return.  Previously been continued on lovastatin 20 mg at bedtime.  5.  Iron deficiency anemia from chronic GI bleed.  Hemoglobin is 8.7.  She continues to have iron infusions and blood transfusions if needed.  This continues to be followed by hematology.  6.  Multiple myeloma currently  undergoing chemotherapy initial diagnosis was made in 11/30/2021.  This continues to be followed by hematology oncology.  She is also scheduled for an echocardiogram to reevaluate the EF.  7.  Disposition patient return to clinic to see MD/APP in 6 months or sooner if needed to reevaluate symptoms and findings of echocardiogram.  If testing results are abnormal appointment can be moved to a sooner date for reevaluation.  Khing Belcher, NP 05/06/2022, 3:41 PM

## 2022-05-06 NOTE — Patient Instructions (Signed)
Medication Instructions:  Your physician recommends that you continue on your current medications as directed. Please refer to the Current Medication list given to you today.  *If you need a refill on your cardiac medications before your next appointment, please call your pharmacy*   Lab Work: Your physician recommends that you return for lab work with next lab draw. Fasting ( Lipid)   If you have labs (blood work) drawn today and your tests are completely normal, you will receive your results only by: [place] (if you have MyChart) OR A paper copy in the mail If you have any lab test that is abnormal or we need to change your treatment, we will call you to review the results.   Testing/Procedures: Your physician has requested that you have an echocardiogram. Echocardiography is a painless test that uses sound waves to create images of your heart. It provides your doctor with information about the size and shape of your heart and how well your heart's chambers and valves are working. This procedure takes approximately one hour. There are no restrictions for this procedure. Please do NOT wear cologne, perfume, aftershave, or lotions (deodorant is allowed). Please arrive 15 minutes prior to your appointment time.    Follow-Up: At [facility] you and your health needs are our priority.  As part of our continuing mission to provide you with exceptional heart care, we have created designated Provider Care Teams.  These Care Teams include your primary Cardiologist (physician) and Advanced Practice Providers (APPs -  Physician Assistants and Nurse Practitioners) who all work together to provide you with the care you need, when you need it.  We recommend signing up for the patient portal called "MyChart".  Sign up information is provided on this After Visit Summary.  MyChart is used to connect with patients for Virtual Visits (Telemedicine).  Patients are able to view lab/test  results, encounter notes, upcoming appointments, etc.  Non-urgent messages can be sent to your provider as well.   To learn more about what you can do with MyChart, go to [email or web address].    Your next appointment:   6 month(s)  Provider:   You may see Carlyle Dolly, MD or one of the following Advanced Practice Providers on your designated Care Team:   Bernerd Pho, PA-C  Ermalinda Barrios, [name]     Other Instructions Thank you for choosing [facility]!

## 2022-05-07 ENCOUNTER — Other Ambulatory Visit: Payer: Self-pay

## 2022-05-10 ENCOUNTER — Inpatient Hospital Stay: Payer: Medicare PPO | Attending: Hematology | Admitting: Hematology

## 2022-05-10 ENCOUNTER — Inpatient Hospital Stay: Payer: Medicare PPO

## 2022-05-10 VITALS — BP 126/42 | HR 80 | Temp 98.9°F | Resp 18 | Wt 288.8 lb

## 2022-05-10 VITALS — BP 125/52 | HR 70 | Temp 98.1°F | Resp 20

## 2022-05-10 DIAGNOSIS — D500 Iron deficiency anemia secondary to blood loss (chronic): Secondary | ICD-10-CM | POA: Diagnosis not present

## 2022-05-10 DIAGNOSIS — Z87442 Personal history of urinary calculi: Secondary | ICD-10-CM | POA: Diagnosis not present

## 2022-05-10 DIAGNOSIS — D509 Iron deficiency anemia, unspecified: Secondary | ICD-10-CM | POA: Insufficient documentation

## 2022-05-10 DIAGNOSIS — Z8379 Family history of other diseases of the digestive system: Secondary | ICD-10-CM | POA: Diagnosis not present

## 2022-05-10 DIAGNOSIS — R0602 Shortness of breath: Secondary | ICD-10-CM | POA: Insufficient documentation

## 2022-05-10 DIAGNOSIS — E119 Type 2 diabetes mellitus without complications: Secondary | ICD-10-CM | POA: Insufficient documentation

## 2022-05-10 DIAGNOSIS — Z8041 Family history of malignant neoplasm of ovary: Secondary | ICD-10-CM | POA: Insufficient documentation

## 2022-05-10 DIAGNOSIS — E785 Hyperlipidemia, unspecified: Secondary | ICD-10-CM | POA: Diagnosis not present

## 2022-05-10 DIAGNOSIS — Z7961 Long term (current) use of immunomodulator: Secondary | ICD-10-CM | POA: Insufficient documentation

## 2022-05-10 DIAGNOSIS — C9000 Multiple myeloma not having achieved remission: Secondary | ICD-10-CM | POA: Diagnosis not present

## 2022-05-10 DIAGNOSIS — I10 Essential (primary) hypertension: Secondary | ICD-10-CM | POA: Insufficient documentation

## 2022-05-10 DIAGNOSIS — Z79899 Other long term (current) drug therapy: Secondary | ICD-10-CM | POA: Insufficient documentation

## 2022-05-10 DIAGNOSIS — Z5112 Encounter for antineoplastic immunotherapy: Secondary | ICD-10-CM | POA: Insufficient documentation

## 2022-05-10 LAB — CBC WITH DIFFERENTIAL/PLATELET
Abs Immature Granulocytes: 0.01 10*3/uL (ref 0.00–0.07)
Basophils Absolute: 0 10*3/uL (ref 0.0–0.1)
Basophils Relative: 1 %
Eosinophils Absolute: 0.1 10*3/uL (ref 0.0–0.5)
Eosinophils Relative: 4 %
HCT: 29.7 % — ABNORMAL LOW (ref 36.0–46.0)
Hemoglobin: 8.9 g/dL — ABNORMAL LOW (ref 12.0–15.0)
Immature Granulocytes: 0 %
Lymphocytes Relative: 32 %
Lymphs Abs: 0.8 10*3/uL (ref 0.7–4.0)
MCH: 29.4 pg (ref 26.0–34.0)
MCHC: 30 g/dL (ref 30.0–36.0)
MCV: 98 fL (ref 80.0–100.0)
Monocytes Absolute: 0.4 10*3/uL (ref 0.1–1.0)
Monocytes Relative: 14 %
Neutro Abs: 1.3 10*3/uL — ABNORMAL LOW (ref 1.7–7.7)
Neutrophils Relative %: 49 %
Platelets: 106 10*3/uL — ABNORMAL LOW (ref 150–400)
RBC: 3.03 MIL/uL — ABNORMAL LOW (ref 3.87–5.11)
RDW: 17 % — ABNORMAL HIGH (ref 11.5–15.5)
WBC: 2.6 10*3/uL — ABNORMAL LOW (ref 4.0–10.5)
nRBC: 0 % (ref 0.0–0.2)

## 2022-05-10 LAB — BRAIN NATRIURETIC PEPTIDE: B Natriuretic Peptide: 42 pg/mL (ref 0.0–100.0)

## 2022-05-10 LAB — COMPREHENSIVE METABOLIC PANEL
ALT: 16 U/L (ref 0–44)
AST: 18 U/L (ref 15–41)
Albumin: 3.2 g/dL — ABNORMAL LOW (ref 3.5–5.0)
Alkaline Phosphatase: 85 U/L (ref 38–126)
Anion gap: 7 (ref 5–15)
BUN: 13 mg/dL (ref 8–23)
CO2: 28 mmol/L (ref 22–32)
Calcium: 8.6 mg/dL — ABNORMAL LOW (ref 8.9–10.3)
Chloride: 103 mmol/L (ref 98–111)
Creatinine, Ser: 0.54 mg/dL (ref 0.44–1.00)
GFR, Estimated: >60 mL/min (ref >60)
Glucose, Bld: 167 mg/dL — ABNORMAL HIGH (ref 70–99)
Potassium: 3.6 mmol/L (ref 3.5–5.1)
Sodium: 138 mmol/L (ref 135–145)
Total Bilirubin: 0.5 mg/dL (ref 0.3–1.2)
Total Protein: 5.6 g/dL — ABNORMAL LOW (ref 6.5–8.1)

## 2022-05-10 LAB — MAGNESIUM: Magnesium: 1.9 mg/dL (ref 1.7–2.4)

## 2022-05-10 LAB — TSH: TSH: 1.796 u[IU]/mL (ref 0.350–4.500)

## 2022-05-10 MED ORDER — HEPARIN SOD (PORK) LOCK FLUSH 100 UNIT/ML IV SOLN
500.0000 [IU] | Freq: Once | INTRAVENOUS | Status: AC | PRN
  Administered 2022-05-10: 500 [IU]

## 2022-05-10 MED ORDER — SODIUM CHLORIDE 0.9% FLUSH
10.0000 mL | Freq: Once | INTRAVENOUS | Status: AC | PRN
  Administered 2022-05-10: 10 mL

## 2022-05-10 MED ORDER — SODIUM CHLORIDE 0.9 % IV SOLN
400.0000 mg | Freq: Once | INTRAVENOUS | Status: AC
  Administered 2022-05-10: 400 mg via INTRAVENOUS
  Filled 2022-05-10: qty 20

## 2022-05-10 MED ORDER — HEPARIN SOD (PORK) LOCK FLUSH 100 UNIT/ML IV SOLN: 500.0000 [IU] | Freq: Once | INTRAVENOUS | Status: DC

## 2022-05-10 MED ORDER — DARATUMUMAB-HYALURONIDASE-FIHJ 1800-30000 MG-UT/15ML ~~LOC~~ SOLN
1800.0000 mg | Freq: Once | SUBCUTANEOUS | Status: AC
  Administered 2022-05-10: 1800 mg via SUBCUTANEOUS
  Filled 2022-05-10: qty 15

## 2022-05-10 MED ORDER — CYANOCOBALAMIN 1000 MCG/ML IJ SOLN
1000.0000 ug | Freq: Once | INTRAMUSCULAR | Status: AC
  Administered 2022-05-10: 1000 ug via INTRAMUSCULAR
  Filled 2022-05-10: qty 1

## 2022-05-10 MED ORDER — SODIUM CHLORIDE 0.9 % IV SOLN: Freq: Once | INTRAVENOUS | Status: AC

## 2022-05-10 MED ORDER — SODIUM CHLORIDE 0.9% FLUSH
10.0000 mL | Freq: Once | INTRAVENOUS | Status: AC
  Administered 2022-05-10: 10 mL via INTRAVENOUS

## 2022-05-10 NOTE — Patient Instructions (Signed)
[facility]  Discharge Instructions: Thank you for choosing [facility] to provide your oncology and hematology care.  If you have a lab appointment with the [facility], please come in thru the Main Entrance and check in at the main information desk.  Wear comfortable clothing and clothing appropriate for easy access to any Portacath or PICC line.   We strive to give you quality time with your provider. You may need to reschedule your appointment if you arrive late (15 or more minutes).  Arriving late affects you and other patients whose appointments are after yours.  Also, if you miss three or more appointments without notifying the office, you may be dismissed from the clinic at the provider's discretion.      For prescription refill requests, have your pharmacy contact our office and allow 72 hours for refills to be completed.    Today you received the following chemotherapy and/or immunotherapy agents Dara Latexo, Venofer IV iron, and Vit B12 shot.   To help prevent nausea and vomiting after your treatment, we encourage you to take your nausea medication as directed.  Daratumumab Injection What is this medication? DARATUMUMAB (dar a toom ue mab) treats multiple myeloma, a type of bone marrow cancer. It works by helping your immune system slow or stop the spread of cancer cells. It is a monoclonal antibody. This medicine may be used for other purposes; ask your health care provider or pharmacist if you have questions. COMMON BRAND NAME(S): DARZALEX What should I tell my care team before I take this medication? They need to know if you have any of these conditions: Hereditary fructose intolerance Infection, such as chickenpox, herpes, hepatitis B virus Lung or breathing disease, such as asthma, COPD An unusual or allergic reaction to daratumumab, sorbitol, other medications, foods, dyes, or preservatives Pregnant or trying to get  pregnant Breast-feeding How should I use this medication? This medication is injected into a vein. It is given by your care team in a hospital or clinic setting. Talk to your care team about the use of this medication in children. Special care may be needed. Overdosage: If you think you have taken too much of this medicine contact a poison control center or emergency room at once. NOTE: This medicine is only for you. Do not share this medicine with others. What if I miss a dose? Keep appointments for follow-up doses. It is important not to miss your dose. Call your care team if you are unable to keep an appointment. What may interact with this medication? Interactions have not been studied. This list may not describe all possible interactions. Give your health care provider a list of all the medicines, herbs, non-prescription drugs, or dietary supplements you use. Also tell them if you smoke, drink alcohol, or use illegal drugs. Some items may interact with your medicine. What should I watch for while using this medication? Your condition will be monitored carefully while you are receiving this medication. This medication can cause serious allergic reactions. To reduce your risk, your care team may give you other medication to take before receiving this one. Be sure to follow the directions from your care team. This medication can affect the results of blood tests to match your blood type. These changes can last for up to 6 months after the final dose. Your care team will do blood tests to match your blood type before you start treatment. Tell all of your care team that you are  being treated with this medication before receiving a blood transfusion. This medication can affect the results of some tests used to determine treatment response; extra tests may be needed to evaluate response. Talk to your care team if you wish to become pregnant or think you are pregnant. This medication can cause serious  birth defects if taken during pregnancy and for 3 months after the last dose. A reliable form of contraception is recommended while taking this medication and for 3 months after the last dose. Talk to your care team about effective forms of contraception. Do not breast-feed while taking this medication. What side effects may I notice from receiving this medication? Side effects that you should report to your care team as soon as possible: Allergic reactions--skin rash, itching, hives, swelling of the face, lips, tongue, or throat Infection--fever, chills, cough, sore throat, wounds that don't heal, pain or trouble when passing urine, general feeling of discomfort or being unwell Infusion reactions--chest pain, shortness of breath or trouble breathing, feeling faint or lightheaded Unusual bruising or bleeding Side effects that usually do not require medical attention (report to your care team if they continue or are bothersome): Constipation Diarrhea Fatigue Nausea Pain, tingling, or numbness in the hands or feet Swelling of the ankles, hands, or feet This list may not describe all possible side effects. Call your doctor for medical advice about side effects. You may report side effects to FDA at 1-800-FDA-1088. Where should I keep my medication? This medication is given in a hospital or clinic. It will not be stored at home. NOTE: This sheet is a summary. It may not cover all possible information. If you have questions about this medicine, talk to your doctor, pharmacist, or health care provider.  2023 Elsevier/Gold Standard (2021-07-07 00:00:00)   BELOW ARE SYMPTOMS THAT SHOULD BE REPORTED IMMEDIATELY: *FEVER GREATER THAN 100.4 F (38 C) OR HIGHER *CHILLS OR SWEATING *NAUSEA AND VOMITING THAT IS NOT CONTROLLED WITH YOUR NAUSEA MEDICATION *UNUSUAL SHORTNESS OF BREATH *UNUSUAL BRUISING OR BLEEDING *URINARY PROBLEMS (pain or burning when urinating, or frequent urination) *BOWEL PROBLEMS  (unusual diarrhea, constipation, pain near the anus) TENDERNESS IN MOUTH AND THROAT WITH OR WITHOUT PRESENCE OF ULCERS (sore throat, sores in mouth, or a toothache) UNUSUAL RASH, SWELLING OR PAIN  UNUSUAL VAGINAL DISCHARGE OR ITCHING   Items with * indicate a potential emergency and should be followed up as soon as possible or go to the Emergency Department if any problems should occur.  Please show the CHEMOTHERAPY ALERT CARD or IMMUNOTHERAPY ALERT CARD at check-in to the Emergency Department and triage nurse.  Should you have questions after your visit or need to cancel or reschedule your appointment, please contact [facility] [phone]  and follow the prompts.  Office hours are 8:00 a.m. to 4:30 p.m. Monday - Friday. Please note that voicemails left after 4:00 p.m. may not be returned until the following business day.  We are closed weekends and major holidays. You have access to a nurse at all times for urgent questions. Please call the main number to the clinic [phone] and follow the prompts.  For any non-urgent questions, you may also contact your provider using MyChart. We now offer e-Visits for anyone 73 and older to request care online for non-urgent symptoms. For details visit [email or web address].   Also download the MyChart app! Go to the app store, search "MyChart", open the app, select [facility], and log in with your MyChart username and password.

## 2022-05-10 NOTE — Progress Notes (Signed)
[facility] [address] [place] [address]    Clinic Day:  05/10/2022  Referring physician: [name], DO  Patient Care Team: [name], DO as PCP - General (Family Medicine) Harl Bowie Alphonse Guild, MD as PCP - Cardiology (Cardiology) Zonia Kief, MD (Rehabilitation) Chesley Mires, MD as Consulting Physician (Pulmonary Disease) Eloise Harman, DO as Consulting Physician (Internal Medicine) Jeanella Anton, NP as Nurse Practitioner (Nurse Practitioner) Roselyn Reef, MD as Referring Physician (Gastroenterology) Derek Jack, MD as Medical Oncologist (Medical Oncology) Brien Mates, RN as Oncology Nurse Navigator (Medical Oncology)   ASSESSMENT & PLAN:   Assessment: 1.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  Orderly maturation of erythroid and myeloid series. - Myeloma FISH panel: t(11;14) - Cytogenetics: Failed to grow metaphases - PET scan (11/18/2021): No evidence of active myeloma or plasmacytoma.  Right upper lobe lung infection. - Skull x-rays (10/29/2021) multiple discrete round lucent/lytic lesions in the skull - Worsening M spike and free light chain ratio.  Normal creatinine and calcium.  Multifactorial anemia including blood loss and bone marrow infiltration. - Cycle 1 of DRd started on 12/14/2021 with lenalidomide 15 mg 3 weeks on/1 week off and dexamethasone 20 mg weekly   2.  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  Quit smoking 15 years ago.  No exposure to chemicals or pesticides. - Paternal grandmother had ovarian cancer.  Sister had ovarian cancer.   3.  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  Seen by transplant hepatology service at [facility].  Plan:  1.  Stage II IgG kappa myeloma, standard risk: - She is tolerating  the current treatment regimen very well. - We reviewed labs from 04/05/2022.  M spike is 0.3 g, down from 0.4 g.  Free light chain ratio is 3.6, down from 4.6 previously. - Reviewed labs today which showed white count 2.6 with ANC of 1.3 and platelet count 106.  LFTs are normal.  TSH is 1.7. - Continue Revlimid 15 mg 3 weeks on/1 week off.  Continue Darzalex every 2 weeks for cycle 6.  With cycle 7, we will change it to every 4 weeks.  Continue Decadron 20 mg weekly. - RTC 6 weeks for follow-up.   2.  Iron deficiency anemia from chronic GI bleed: - Hemoglobin today is 8.9.  She started feeling better after last iron infusion.  Will continue Venofer every 2 weeks.  She did not see any bleeding per rectum or melena lately.   3.  Infection prophylaxis: - Continue acyclovir 400 mg twice daily for shingles prophylaxis.  Continue to hold aspirin due to history of GI bleed.   4.  Cirrhosis: - Continue Lasix 20 mg daily, carvedilol 6.25 mg daily and lactulose daily.  No orders of the defined types were placed in this encounter.     Beverly Gust Oliver,acting as a scribe for Derek Jack, MD.,have documented all relevant documentation on the behalf of Derek Jack, MD,as directed by  Derek Jack, MD while in the presence of Derek Jack, MD.   I, Derek Jack MD, have reviewed the above documentation for accuracy and completeness, and I agree with the above.   Derek Jack, MD   2/13/202410:25 AM  CHIEF COMPLAINT:   Diagnosis: stage II standard risk IgG kappa plasma cell myeloma    Cancer  Staging  Multiple myeloma ([place]) Staging form: Plasma Cell Myeloma and Plasma Cell Disorders, AJCC 8th Edition - Clinical stage from 11/30/2021: RISS Stage II (Beta-2-microglobulin (mg/L): 2.8, Albumin (g/dL): 3, ISS: Stage II, High-risk cytogenetics: Absent, LDH: Normal) - Signed by Derek Jack, MD on 11/30/2021    Prior Therapy: None  Current Therapy:    Daratumumab, lenalidomide and dexamethasone     HISTORY OF PRESENT ILLNESS:   Oncology History  Multiple myeloma ([place])  11/30/2021 Initial Diagnosis   Multiple myeloma ([place])   11/30/2021 Cancer Staging   Staging form: Plasma Cell Myeloma and Plasma Cell Disorders, AJCC 8th Edition - Clinical stage from 11/30/2021: RISS Stage II (Beta-2-microglobulin (mg/L): 2.8, Albumin (g/dL): 3, ISS: Stage II, High-risk cytogenetics: Absent, LDH: Normal) - Signed by Derek Jack, MD on 11/30/2021 Histopathologic type: Multiple myeloma Stage prefix: Initial diagnosis Beta 2 microglobulin range (mg/L): Less than 3.5 Albumin range (g/dL): Less than 3.5 Cytogenetics: t(11;14) translocation Serum calcium level: Normal   12/14/2021 -  Chemotherapy   Patient is on Treatment Plan : MYELOMA  Daratumumab SQ + Lenalidomide + Dexamethasone (DaraRd) q28d        INTERVAL HISTORY:   Savannah Burns is a 72 y.o. female presenting to clinic today for follow up of stage II standard risk IgG kappa plasma cell myeloma . She was last seen by me on 04/25/2022.  Today, she states that she is doing fair overall. Her appetite level is at 100%. Her energy level is at 70%. She noticed blood in stool and black stool prior to treatment at [facility], but denies any blood in stool and black stool recently. She noticed since her treatment started she started to become forgetful ( sometimes has difficulty recalling names). She is doing fine with Revlimid. She did not feel well after first iron infusion but better after her second infusion. She has good bowel movements. She noticed numbness in her hands in the evenings. She usually watches TV prior to this.   PAST MEDICAL HISTORY:   Past Medical History: Past Medical History:  Diagnosis Date   Adrenal adenoma, left    Stable   Anxiety    Arthritis    bilateral hands   Depression    Diabetes mellitus, type 2 ([place]) 08/12/2008   Qualifier: Diagnosis of  By: Deborra Medina MD, Talia     Dyspnea     Esophageal varices (HCC)    Grade II diastolic dysfunction    History of kidney stones    Hyperlipidemia    Hypertension    Lower back pain    Lower GI bleed 03/19/2020   Panic attacks    Pneumonia    currently taking antibiotic and prednisone for early stages of pneumonia   Pulmonary nodules    bilateral   Skin cancer    face    Surgical History: Past Surgical History:  Procedure Laterality Date   BIOPSY  04/07/2020   Procedure: BIOPSY;  Surgeon: Eloise Harman, DO;  [facility];  Service: Endoscopy;;   Breast Cystectomy  Right    CESAREAN SECTION     COLONOSCOPY WITH PROPOFOL N/A 01/25/2020   Dr. Abbey Chatters: Nonbleeding internal hemorrhoids, diverticulosis, 5 mm polyp removed from the ascending colon, 10 mm polyp removed from the sigmoid colon, 30 mm polyp (tubulovillous adenoma with no high-grade dysplasia) removed from the transverse colon via piecemeal status post tattoo.  Other polyps were tubular adenomas.  3 month surveillance colonoscopy recommended.   COLONOSCOPY WITH PROPOFOL N/A 04/07/2020   Procedure:  COLONOSCOPY WITH PROPOFOL;  Surgeon: Eloise Harman, DO;  [facility];  Service: Endoscopy;  Laterality: N/A;  3:00pm, pt knows new time per office   CYSTOSCOPY/URETEROSCOPY/HOLMIUM LASER/STENT PLACEMENT Bilateral 03/01/2019   Procedure: CYSTOSCOPY/RETROGRADEURETEROSCOPY/HOLMIUM LASER/STENT PLACEMENT;  Surgeon: Ceasar Mons, MD;  Location: [facility];  Service: Urology;  Laterality: Bilateral;  ONLY NEEDS 60 MIN   ESOPHAGOGASTRODUODENOSCOPY (EGD) WITH PROPOFOL N/A 01/25/2020   Dr. Abbey Chatters: 4 columns grade 1 esophageal varices   ESOPHAGOGASTRODUODENOSCOPY (EGD) WITH PROPOFOL N/A 05/18/2020   Procedure: ESOPHAGOGASTRODUODENOSCOPY (EGD) WITH PROPOFOL;  Surgeon: Eloise Harman, DO;  [facility];  Service: Endoscopy;  Laterality: N/A;   ESOPHAGOGASTRODUODENOSCOPY (EGD) WITH PROPOFOL N/A 07/28/2020   Procedure:  ESOPHAGOGASTRODUODENOSCOPY (EGD) WITH PROPOFOL;  Surgeon: Eloise Harman, DO;  [facility];  Service: Endoscopy;  Laterality: N/A;  am or early PM due to givens capsule placement   GIVENS CAPSULE STUDY N/A 05/18/2020   Procedure: [place];  Surgeon: Harvel Quale, MD;  [facility];  Service: Gastroenterology;  Laterality: N/A;   GIVENS CAPSULE STUDY N/A 07/28/2020   Procedure: GIVENS CAPSULE STUDY;  Surgeon: Eloise Harman, DO;  [facility];  Service: Endoscopy;  Laterality: N/A;   IR IMAGING GUIDED PORT INSERTION  12/02/2021   POLYPECTOMY  01/25/2020   Procedure: POLYPECTOMY;  Surgeon: Eloise Harman, DO;  [facility];  Service: Endoscopy;;   POLYPECTOMY  04/07/2020   Procedure: POLYPECTOMY INTESTINAL;  Surgeon: Eloise Harman, DO;  [facility];  Service: Endoscopy;;   SKIN CANCER EXCISION     Face   SPINE SURGERY     SUBMUCOSAL TATTOO INJECTION  01/25/2020   Procedure: SUBMUCOSAL TATTOO INJECTION;  Surgeon: Eloise Harman, DO;  [facility];  Service: Endoscopy;;    Social History: Social History   Socioeconomic History   Marital status: Widowed    Spouse name: Not on file   Number of children: 2   Years of education: 14   Highest education level: Not on file  Occupational History   Occupation: Retired   Tobacco Use   Smoking status: Former    Packs/day: 1.50    Years: 40.00    Total pack years: 60.00    Types: Cigarettes    Quit date: 04/29/2015    Years since quitting: 7.0   Smokeless tobacco: Never   Tobacco comments:    Quit smoking 04/2015- Previous 1.5 ppd smoker  Vaping Use   Vaping Use: Never used  Substance and Sexual Activity   Alcohol use: No    Alcohol/week: 0.0 standard drinks of alcohol   Drug use: No   Sexual activity: Not Currently    Birth control/protection: Post-menopausal  Other Topics Concern   Not on file  Social History Narrative   Her 57 year  old granddaughter lives with her - one daughter lives nearby, but she doesn't have a good relationship with her. Has a great relationship with other daughter who lives 1.5 hrs away - talks to her daily on the phone.   Social Determinants of Health   Financial Resource Strain: Medium Risk (07/27/2021)   Overall Financial Resource Strain (CARDIA)    Difficulty of Paying Living Expenses: Somewhat hard  Food Insecurity: Food Insecurity Present (07/27/2021)   Hunger Vital Sign    Worried About Running Out of Food in the Last Year: Sometimes true    Ran Out of Food in the Last Year:  Never true  Transportation Needs: No Transportation Needs (07/27/2021)   PRAPARE - Hydrologist (Medical): No    Lack of Transportation (Non-Medical): No  Physical Activity: Inactive (07/27/2021)   Exercise Vital Sign    Days of Exercise per Week: 0 days    Minutes of Exercise per Session: 0 min  Stress: Stress Concern Present (07/27/2021)   [place]    Feeling of Stress : To some extent  Social Connections: Socially Isolated (07/27/2021)   Social Connection and Isolation Panel [NHANES]    Frequency of Communication with Friends and Family: More than three times a week    Frequency of Social Gatherings with Friends and Family: Once a week    Attends Religious Services: Never    Marine scientist or Organizations: No    Attends Archivist Meetings: Never    Marital Status: Widowed  Intimate Partner Violence: Not At Risk (07/27/2021)   Humiliation, Afraid, Rape, and Kick questionnaire    Fear of Current or Ex-Partner: No    Emotionally Abused: No    Physically Abused: No    Sexually Abused: No    Family History: Family History  Problem Relation Age of Onset   Diabetes Father    Heart disease Father 32       MI   Hypertension Father    Anemia Mother        Transfusion dependent   COPD Sister    Cancer  Paternal Grandmother 28       Pancreatic    Current Medications:  Current Outpatient Medications:    acyclovir (ZOVIRAX) 400 MG tablet, Take 1 tablet (400 mg total) by mouth 2 (two) times daily., Disp: 60 tablet, Rfl: 6   baclofen (LIORESAL) 10 MG tablet, Take 10 mg by mouth 2 (two) times daily., Disp: , Rfl:    Blood Glucose Calibration (TRUE METRIX LEVEL 1) Low SOLN, Use w/ glucose monitor Dx E11.9, Disp: 3 each, Rfl: 0   Blood Glucose Monitoring Suppl (TRUE METRIX AIR GLUCOSE METER) w/Device KIT, Check BS daily Dx E11.9, Disp: 1 kit, Rfl: 0   carvedilol (COREG) 6.25 MG tablet, Take 6.25 mg by mouth 2 (two) times daily., Disp: , Rfl:    Cholecalciferol (VITAMIN D) 50 MCG (2000 UT) tablet, Take 2,000 Units by mouth daily., Disp: , Rfl:    clobetasol (TEMOVATE) 0.05 % external solution, Apply topically., Disp: , Rfl:    CONSTULOSE 10 GM/15ML solution, Take by mouth., Disp: , Rfl:    Cyanocobalamin (B-12 COMPLIANCE INJECTION) 1000 MCG/ML KIT, Inject 1,000 mcg as directed every 30 (thirty) days., Disp: , Rfl:    daratumumab-hyaluronidase-fihj (DARZALEX FASPRO) 1800-30000 MG-UT/15ML SOLN, Inject 1,800 mg into the skin once a week., Disp: , Rfl:    desvenlafaxine (PRISTIQ) 100 MG 24 hr tablet, Take 1 tablet (100 mg total) by mouth daily., Disp: 90 tablet, Rfl: 3   dexamethasone (DECADRON) 4 MG tablet, Take 5 tablets (20 mg total) by mouth once a week., Disp: 20 tablet, Rfl: 3   esomeprazole (NEXIUM) 20 MG capsule, Take 1 capsule (20 mg total) by mouth daily at 12 noon., Disp: 90 capsule, Rfl: 3   ferrous sulfate 325 (65 FE) MG tablet, Take 1 tablet (325 mg total) by mouth daily with breakfast., Disp: 30 tablet, Rfl: 3   fluticasone (CUTIVATE) 0.05 % cream, Apply 1 Application topically daily., Disp: , Rfl:    furosemide (LASIX) 20 MG  tablet, Take 20 mg by mouth. bid, Disp: , Rfl:    glucose blood (TRUE METRIX BLOOD GLUCOSE TEST) test strip, Check BS daily Dx E11.9, Disp: 100 each, Rfl: 3    lenalidomide (REVLIMID) 15 MG capsule, TAKE 1 CAPSULE BY MOUTH EVERY DAY FOR 21 DAYS ON THEN 7 DAYS OFF, Disp: 21 capsule, Rfl: 0   lidocaine-prilocaine (EMLA) cream, Apply 1 Application topically as needed. Apply a small amount to port a cath site and cover with plastic wrap 1 hour prior to infusion appointments, Disp: 30 g, Rfl: 3   lovastatin (MEVACOR) 20 MG tablet, Take 1 tablet (20 mg total) by mouth at bedtime., Disp: 90 tablet, Rfl: 3   omeprazole (PRILOSEC) 20 MG capsule, Take 20 mg by mouth 2 (two) times daily., Disp: , Rfl:    oxyCODONE-acetaminophen (PERCOCET) 10-325 MG tablet, Take 1 tablet by mouth every 6 (six) hours as needed for pain., Disp: , Rfl:    OXYGEN, Inhale 3 L into the lungs continuous., Disp: , Rfl:    OZEMPIC, 0.25 OR 0.5 MG/DOSE, 2 MG/3ML SOPN, Inject into the skin., Disp: , Rfl:    potassium chloride SA (KLOR-CON) 20 MEQ tablet, Take 2 tablets (40 mEq total) by mouth 2 (two) times daily. (Patient taking differently: Take 40 mEq by mouth daily.), Disp: 360 tablet, Rfl: 3   prochlorperazine (COMPAZINE) 10 MG tablet, Take 1 tablet (10 mg total) by mouth every 6 (six) hours as needed for nausea or vomiting., Disp: 30 tablet, Rfl: 6   TRUEplus Lancets 33G MISC, Check BS daily Dx E11.9, Disp: 100 each, Rfl: 3   torsemide (DEMADEX) 20 MG tablet, Take 3 tablets (60 mg total) by mouth 2 (two) times daily. (Patient taking differently: Take 50 mg by mouth 2 (two) times daily.), Disp: 180 tablet, Rfl: 11 No current facility-administered medications for this visit.  Facility-Administered Medications Ordered in Other Visits:    daratumumab-hyaluronidase-fihj (DARZALEX FASPRO) 1800-30000 MG-UT/15ML chemo SQ injection 1,800 mg, 1,800 mg, Subcutaneous, Once, Derek Jack, MD   heparin lock flush 100 unit/mL, 500 Units, Intravenous, Once, Derek Jack, MD   heparin lock flush 100 unit/mL, 500 Units, Intracatheter, Once PRN, Derek Jack, MD   iron sucrose  (VENOFER) 400 mg in sodium chloride 0.9 % 250 mL IVPB, 400 mg, Intravenous, Once, Derek Jack, MD, Last Rate: 108 mL/hr at 05/10/22 0902, 400 mg at 05/10/22 0902   sodium chloride flush (NS) 0.9 % injection 10 mL, 10 mL, Intravenous, PRN, Pennington, Rebekah M, PA-C, 10 mL at 03/08/22 1344   sodium chloride flush (NS) 0.9 % injection 10 mL, 10 mL, Intracatheter, Once PRN, Derek Jack, MD   Allergies: Allergies  Allergen Reactions   Keflex [Cephalexin] Nausea And Vomiting    REVIEW OF SYSTEMS:   Review of Systems  Constitutional:  Negative for chills, fatigue and fever.  HENT:   Negative for lump/mass, mouth sores, nosebleeds, sore throat and trouble swallowing.   Eyes:  Negative for eye problems.  Respiratory:  Positive for shortness of breath (3Lof O2). Negative for cough.   Cardiovascular:  Negative for chest pain, leg swelling and palpitations.  Gastrointestinal:  Negative for abdominal pain, constipation, diarrhea, nausea and vomiting.  Genitourinary:  Negative for bladder incontinence, difficulty urinating, dysuria, frequency, hematuria and nocturia.   Musculoskeletal:  Negative for arthralgias, back pain, flank pain, myalgias and neck pain.  Skin:  Negative for itching and rash.  Neurological:  Positive for numbness (Tingling in hands and feet). Negative for dizziness and headaches.  Hematological:  Does not bruise/bleed easily.  Psychiatric/Behavioral:  Negative for depression, sleep disturbance and suicidal ideas. The patient is nervous/anxious.   All other systems reviewed and are negative.    VITALS:   There were no vitals taken for this visit.  Wt Readings from Last 3 Encounters:  05/10/22 288 lb 12.8 oz (131 kg)  05/06/22 291 lb 3.2 oz (132.1 kg)  04/26/22 (!) 305 lb 1.9 oz (138.4 kg)    There is no height or weight on file to calculate BMI.  Performance status (ECOG): 1 - Symptomatic but completely ambulatory  PHYSICAL EXAM:   Physical  Exam Vitals and nursing note reviewed. Exam conducted with a chaperone present.  Constitutional:      Appearance: Normal appearance.  Cardiovascular:     Rate and Rhythm: Normal rate and regular rhythm.     Pulses: Normal pulses.     Heart sounds: Normal heart sounds.     Comments: Tender at the ankles Pulmonary:     Effort: Pulmonary effort is normal.     Breath sounds: Normal breath sounds.  Abdominal:     Palpations: Abdomen is soft. There is no hepatomegaly, splenomegaly or mass.     Tenderness: There is no abdominal tenderness.  Musculoskeletal:     Right lower leg: 2+ Edema present.     Left lower leg: 2+ Edema present.  Lymphadenopathy:     Cervical: No cervical adenopathy.     Right cervical: No superficial, deep or posterior cervical adenopathy.    Left cervical: No superficial, deep or posterior cervical adenopathy.     Upper Body:     Right upper body: No supraclavicular or axillary adenopathy.     Left upper body: No supraclavicular or axillary adenopathy.  Neurological:     General: No focal deficit present.     Mental Status: She is alert and oriented to person, place, and time.  Psychiatric:        Mood and Affect: Mood normal.        Behavior: Behavior normal.     LABS:      Latest Ref Rng & Units 05/10/2022    8:19 AM 04/26/2022    9:32 AM 04/19/2022    7:52 AM  CBC  WBC 4.0 - 10.5 K/uL 2.6  2.1  1.6   Hemoglobin 12.0 - 15.0 g/dL 8.9  8.7  8.0   Hematocrit 36.0 - 46.0 % 29.7  29.4  27.8   Platelets 150 - 400 K/uL 106  98  82       Latest Ref Rng & Units 05/10/2022    8:19 AM 04/05/2022   12:13 PM 03/08/2022    9:26 AM  CMP  Glucose 70 - 99 mg/dL 167  109  159   BUN 8 - 23 mg/dL 13  18  16   $ Creatinine 0.44 - 1.00 mg/dL 0.54  0.56  0.57   Sodium 135 - 145 mmol/L 138  137  138   Potassium 3.5 - 5.1 mmol/L 3.6  3.6  4.2   Chloride 98 - 111 mmol/L 103  102  105   CO2 22 - 32 mmol/L 28  29  27   $ Calcium 8.9 - 10.3 mg/dL 8.6  8.4  8.5   Total Protein  6.5 - 8.1 g/dL 5.6  5.7  5.9   Total Bilirubin 0.3 - 1.2 mg/dL 0.5  0.4  0.2   Alkaline Phos 38 - 126 U/L 85  85  97  AST 15 - 41 U/L 18  18  17   $ ALT 0 - 44 U/L 16  14  19      $ No results found for: "CEA1", "CEA" / No results found for: "CEA1", "CEA" No results found for: "PSA1" No results found for: "[id number]" No results found for: "CAN125"  Lab Results  Component Value Date   TOTALPROTELP 5.4 (L) 04/05/2022   ALBUMINELP 3.4 04/05/2022   A1GS 0.2 04/05/2022   A2GS 0.5 04/05/2022   BETS 0.7 04/05/2022   GAMS 0.5 04/05/2022   MSPIKE 0.3 (H) 04/05/2022   SPEI Comment 04/05/2022   Lab Results  Component Value Date   TIBC 425 03/31/2022   TIBC 438 03/01/2022   TIBC 356 11/30/2021   FERRITIN 66 03/31/2022   FERRITIN 7 (L) 03/01/2022   FERRITIN 13 11/30/2021   IRONPCTSAT 9 (L) 03/31/2022   IRONPCTSAT 5 (L) 03/01/2022   IRONPCTSAT 8 (L) 11/30/2021   Lab Results  Component Value Date   LDH 92 (L) 10/22/2021   LDH 101 06/25/2021   LDH 103 03/24/2021     STUDIES:   No results found.

## 2022-05-10 NOTE — Progress Notes (Signed)
Patients port flushed without difficulty.  Good blood return noted with no bruising or swelling noted at site.  Stable during access and blood draw.  Patient to remain accessed for infusion.

## 2022-05-10 NOTE — Patient Instructions (Addendum)
Savannah Burns at [facility] Discharge Instructions   You were seen and examined today by Dr. Delton Coombes.  Your lab work from today is normal/stable. Your hemoglobin is 8.9 today.   We are giving Venofer (iron infusion) today and every 2 weeks.   We will proceed with your treatment today as long as the blood work is normal/stable.   Return as scheduled.    Thank you for choosing [facility] [place] [facility] to provide your oncology and hematology care.  To afford each patient quality time with our provider, please arrive at least 15 minutes before your scheduled appointment time.   If you have a lab appointment with the [facility] please come in thru the Main Entrance and check in at the main information desk.  You need to re-schedule your appointment should you arrive 10 or more minutes late.  We strive to give you quality time with our providers, and arriving late affects you and other patients whose appointments are after yours.  Also, if you no show three or more times for appointments you may be dismissed from the clinic at the providers discretion.     Again, thank you for choosing [facility].  Our hope is that these requests will decrease the amount of time that you wait before being seen by our physicians.       _____________________________________________________________  Should you have questions after your visit to [facility], please contact our office at [phone] and follow the prompts.  Our office hours are 8:00 a.m. and 4:30 p.m. Monday - Friday.  Please note that voicemails left after 4:00 p.m. may not be returned until the following business day.  We are closed weekends and major holidays.  You do have access to a nurse 24-7, just call the main number to the clinic [phone] and do not press any options, hold on the line and a nurse will answer the phone.    For prescription refill requests, have  your pharmacy contact our office and allow 72 hours.    Due to Covid, you will need to wear a mask upon entering the hospital. If you do not have a mask, a mask will be given to you at the Main Entrance upon arrival. For doctor visits, patients may have 1 support person age 12 or older with them. For treatment visits, patients can not have anyone with them due to social distancing guidelines and our immunocompromised population.

## 2022-05-10 NOTE — Progress Notes (Signed)
Patient took her pre-meds at home

## 2022-05-10 NOTE — Progress Notes (Signed)
Patient is taking Revlimid as prescribed.  She has not missed any doses and reports no side effects at this time.    Patient has been examined by Dr. Katragadda, and vital signs and labs have been reviewed. ANC, Creatinine, LFTs, hemoglobin, and platelets are within treatment parameters per M.D. - pt may proceed with treatment.  Primary RN and pharmacy notified.  

## 2022-05-10 NOTE — Progress Notes (Signed)
Pt presents today for [facility] [place], Venofer IV iron infusion,  and Vit B12 injection per provider's order. Vital signs and other labs WNL for treatment today. Pt's ANC is 1.3 today. Okay to proceed with treatment today per Dr.K.  Pt took pre-meds at home prior to arrival.  [facility], Venofer 400 mg, and Vit B12 injection given today per MD orders. Tolerated infusion without adverse affects. Vital signs stable. No complaints at this time. Discharged from clinic via wheelchair in stable condition. Alert and oriented x 3. F/U with [facility] as scheduled.

## 2022-05-11 LAB — KAPPA/LAMBDA LIGHT CHAINS
Kappa free light chain: 16.1 mg/L (ref 3.3–19.4)
Kappa, lambda light chain ratio: 2.04 — ABNORMAL HIGH (ref 0.26–1.65)
Lambda free light chains: 7.9 mg/L (ref 5.7–26.3)

## 2022-05-12 ENCOUNTER — Telehealth: Payer: Self-pay

## 2022-05-12 NOTE — Telephone Encounter (Signed)
Patient notified labs were normal and verbalized understanding. Patient had no questions or concerns at this time.

## 2022-05-12 NOTE — Telephone Encounter (Signed)
Arnoldo Lenis, MD  Normal labs   Zandra Abts MD

## 2022-05-13 LAB — PROTEIN ELECTROPHORESIS, SERUM
A/G Ratio: 1.5 (ref 0.7–1.7)
Albumin ELP: 3.2 g/dL (ref 2.9–4.4)
Alpha-1-Globulin: 0.2 g/dL (ref 0.0–0.4)
Alpha-2-Globulin: 0.6 g/dL (ref 0.4–1.0)
Beta Globulin: 0.8 g/dL (ref 0.7–1.3)
Gamma Globulin: 0.5 g/dL (ref 0.4–1.8)
Globulin, Total: 2.1 g/dL — ABNORMAL LOW (ref 2.2–3.9)
M-Spike, %: 0.3 g/dL — ABNORMAL HIGH
Total Protein ELP: 5.3 g/dL — ABNORMAL LOW (ref 6.0–8.5)

## 2022-05-17 ENCOUNTER — Other Ambulatory Visit: Payer: Medicare PPO

## 2022-05-17 ENCOUNTER — Ambulatory Visit: Payer: Medicare PPO | Admitting: Hematology

## 2022-05-17 ENCOUNTER — Ambulatory Visit: Payer: Medicare PPO

## 2022-05-23 ENCOUNTER — Other Ambulatory Visit: Payer: Self-pay | Admitting: Hematology

## 2022-05-23 ENCOUNTER — Other Ambulatory Visit: Payer: Self-pay

## 2022-05-23 ENCOUNTER — Encounter (HOSPITAL_COMMUNITY): Payer: Self-pay | Admitting: *Deleted

## 2022-05-23 ENCOUNTER — Emergency Department (HOSPITAL_COMMUNITY)
Admission: EM | Admit: 2022-05-23 | Discharge: 2022-05-23 | Disposition: A | Discharge status: Home / Self Care | Payer: Medicare PPO | Attending: Emergency Medicine | Admitting: Emergency Medicine

## 2022-05-23 DIAGNOSIS — Z79899 Other long term (current) drug therapy: Secondary | ICD-10-CM | POA: Diagnosis not present

## 2022-05-23 DIAGNOSIS — E119 Type 2 diabetes mellitus without complications: Secondary | ICD-10-CM | POA: Diagnosis not present

## 2022-05-23 DIAGNOSIS — Z85828 Personal history of other malignant neoplasm of skin: Secondary | ICD-10-CM | POA: Insufficient documentation

## 2022-05-23 DIAGNOSIS — S81811A Laceration without foreign body, right lower leg, initial encounter: Secondary | ICD-10-CM | POA: Insufficient documentation

## 2022-05-23 DIAGNOSIS — C9000 Multiple myeloma not having achieved remission: Secondary | ICD-10-CM

## 2022-05-23 DIAGNOSIS — Z23 Encounter for immunization: Secondary | ICD-10-CM | POA: Diagnosis not present

## 2022-05-23 DIAGNOSIS — S8991XA Unspecified injury of right lower leg, initial encounter: Secondary | ICD-10-CM | POA: Diagnosis present

## 2022-05-23 DIAGNOSIS — I10 Essential (primary) hypertension: Secondary | ICD-10-CM | POA: Diagnosis not present

## 2022-05-23 DIAGNOSIS — W268XXA Contact with other sharp object(s), not elsewhere classified, initial encounter: Secondary | ICD-10-CM | POA: Diagnosis not present

## 2022-05-23 MED ORDER — BACITRACIN ZINC 500 UNIT/GM EX OINT
TOPICAL_OINTMENT | Freq: Two times a day (BID) | CUTANEOUS | Status: DC
  Filled 2022-05-23: qty 0.9

## 2022-05-23 MED ORDER — TETANUS-DIPHTH-ACELL PERTUSSIS 5-2.5-18.5 LF-MCG/0.5 IM SUSY
0.5000 mL | PREFILLED_SYRINGE | Freq: Once | INTRAMUSCULAR | Status: AC
  Administered 2022-05-23: 0.5 mL via INTRAMUSCULAR
  Filled 2022-05-23: qty 0.5

## 2022-05-23 MED ORDER — LIDOCAINE-EPINEPHRINE (PF) 2 %-1:200000 IJ SOLN
20.0000 mL | Freq: Once | INTRAMUSCULAR | Status: AC
  Administered 2022-05-23: 20 mL
  Filled 2022-05-23: qty 20

## 2022-05-23 MED ORDER — ACETAMINOPHEN 500 MG PO TABS: 1000.0000 mg | ORAL_TABLET | Freq: Once | ORAL | Status: DC

## 2022-05-23 MED ORDER — OXYCODONE-ACETAMINOPHEN 5-325 MG PO TABS
1.0000 | ORAL_TABLET | Freq: Once | ORAL | Status: AC
  Administered 2022-05-23: 1 via ORAL
  Filled 2022-05-23: qty 1

## 2022-05-23 MED ORDER — BACITRACIN ZINC 500 UNIT/GM EX OINT: 1.0000 | TOPICAL_OINTMENT | Freq: Two times a day (BID) | CUTANEOUS | 0 refills | Status: DC

## 2022-05-23 NOTE — ED Provider Notes (Signed)
[facility] Provider Note   CSN: [id number] Arrival date & time: 05/23/22  1847     History  Chief Complaint  Patient presents with   Extremity Laceration    Savannah Burns is a 72 y.o. female presents for evaluation of laceration on right lower leg.  Patient was trying to get into her car when the corner of the car door cut her right lower leg.  Bleeding is controlled however patient noticed some weeping fluid.  History of chronic leg edema.  He was not sure when her last shot was.  HPI  Past Medical History:  Diagnosis Date   Adrenal adenoma, left    Stable   Anxiety    Arthritis    bilateral hands   Depression    Diabetes mellitus, type 2 ([place]) 08/12/2008   Qualifier: Diagnosis of  By: Deborra Medina MD, Talia     Dyspnea    Esophageal varices (HCC)    Grade II diastolic dysfunction    History of kidney stones    Hyperlipidemia    Hypertension    Lower back pain    Lower GI bleed 03/19/2020   Panic attacks    Pneumonia    currently taking antibiotic and prednisone for early stages of pneumonia   Pulmonary nodules    bilateral   Skin cancer    face   Past Surgical History:  Procedure Laterality Date   BIOPSY  04/07/2020   Procedure: BIOPSY;  Surgeon: Eloise Harman, DO;  [facility]: [facility];  Service: Endoscopy;;   Breast Cystectomy  Right    CESAREAN SECTION     COLONOSCOPY WITH PROPOFOL N/A 01/25/2020   Dr. Abbey Chatters: Nonbleeding internal hemorrhoids, diverticulosis, 5 mm polyp removed from the ascending colon, 10 mm polyp removed from the sigmoid colon, 30 mm polyp (tubulovillous adenoma with no high-grade dysplasia) removed from the transverse colon via piecemeal status post tattoo.  Other polyps were tubular adenomas.  3 month surveillance colonoscopy recommended.   COLONOSCOPY WITH PROPOFOL N/A 04/07/2020   Procedure: COLONOSCOPY WITH PROPOFOL;  Surgeon: Eloise Harman, DO;  [facility]: [facility];  Service:  Endoscopy;  Laterality: N/A;  3:00pm, pt knows new time per office   CYSTOSCOPY/URETEROSCOPY/HOLMIUM LASER/STENT PLACEMENT Bilateral 03/01/2019   Procedure: CYSTOSCOPY/RETROGRADEURETEROSCOPY/HOLMIUM LASER/STENT PLACEMENT;  Surgeon: Ceasar Mons, MD;  Location: [facility];  Service: Urology;  Laterality: Bilateral;  ONLY NEEDS 60 MIN   ESOPHAGOGASTRODUODENOSCOPY (EGD) WITH PROPOFOL N/A 01/25/2020   Dr. Abbey Chatters: 4 columns grade 1 esophageal varices   ESOPHAGOGASTRODUODENOSCOPY (EGD) WITH PROPOFOL N/A 05/18/2020   Procedure: ESOPHAGOGASTRODUODENOSCOPY (EGD) WITH PROPOFOL;  Surgeon: Eloise Harman, DO;  [facility];  Service: Endoscopy;  Laterality: N/A;   ESOPHAGOGASTRODUODENOSCOPY (EGD) WITH PROPOFOL N/A 07/28/2020   Procedure: ESOPHAGOGASTRODUODENOSCOPY (EGD) WITH PROPOFOL;  Surgeon: Eloise Harman, DO;  [facility]: [facility];  Service: Endoscopy;  Laterality: N/A;  am or early PM due to givens capsule placement   GIVENS CAPSULE STUDY N/A 05/18/2020   Procedure: Murphy;  Surgeon: Harvel Quale, MD;  Location: [facility];  Service: Gastroenterology;  Laterality: N/A;   GIVENS CAPSULE STUDY N/A 07/28/2020   Procedure: GIVENS CAPSULE STUDY;  Surgeon: Eloise Harman, DO;  [facility]: [facility];  Service: Endoscopy;  Laterality: N/A;   IR IMAGING GUIDED PORT INSERTION  12/02/2021   POLYPECTOMY  01/25/2020   Procedure: POLYPECTOMY;  Surgeon: Eloise Harman, DO;  [facility];  Service: Endoscopy;;   POLYPECTOMY  04/07/2020   Procedure: POLYPECTOMY INTESTINAL;  Surgeon: Eloise Harman, DO;  [facility];  Service: Endoscopy;;   SKIN CANCER EXCISION     Face   SPINE SURGERY     SUBMUCOSAL TATTOO INJECTION  01/25/2020   Procedure: SUBMUCOSAL TATTOO INJECTION;  Surgeon: Eloise Harman, DO;  [facility];  Service: Endoscopy;;     Home Medications Prior to Admission medications   Medication Sig Start Date  End Date Taking? Authorizing Provider  bacitracin ointment Apply 1 Application topically 2 (two) times daily. 05/23/22  Yes Rex Kras, PA  acyclovir (ZOVIRAX) 400 MG tablet Take 1 tablet (400 mg total) by mouth 2 (two) times daily. 11/30/21   Derek Jack, MD  baclofen (LIORESAL) 10 MG tablet Take 10 mg by mouth 2 (two) times daily. 03/03/22   [provider]  Blood Glucose Calibration (TRUE METRIX LEVEL 1) Low SOLN Use w/ glucose monitor Dx E11.9 09/01/21   Ronnie Doss M, DO  Blood Glucose Monitoring Suppl (TRUE METRIX AIR GLUCOSE METER) w/Device KIT Check BS daily Dx E11.9 09/01/21   Ronnie Doss M, DO  carvedilol (COREG) 6.25 MG tablet Take 6.25 mg by mouth 2 (two) times daily. 11/10/21   [provider]  Cholecalciferol (VITAMIN D) 50 MCG (2000 UT) tablet Take 2,000 Units by mouth daily.    [provider]  clobetasol (TEMOVATE) 0.05 % external solution Apply topically. 01/24/22   [provider]  CONSTULOSE 10 GM/15ML solution Take by mouth. 11/10/21   [provider]  Cyanocobalamin (B-12 COMPLIANCE INJECTION) 1000 MCG/ML KIT Inject 1,000 mcg as directed every 30 (thirty) days.    [provider]  daratumumab-hyaluronidase-fihj (DARZALEX FASPRO) 1800-30000 MG-UT/15ML SOLN Inject 1,800 mg into the skin once a week. 12/14/21   [provider]  desvenlafaxine (PRISTIQ) 100 MG 24 hr tablet Take 1 tablet (100 mg total) by mouth daily. 02/25/22   [name], DO  dexamethasone (DECADRON) 4 MG tablet Take 5 tablets (20 mg total) by mouth once a week. 04/05/22   Derek Jack, MD  esomeprazole (NEXIUM) 20 MG capsule Take 1 capsule (20 mg total) by mouth daily at 12 noon. 12/09/20   [name], DO  ferrous sulfate 325 (65 FE) MG tablet Take 1 tablet (325 mg total) by mouth daily with breakfast. 02/22/22 06/22/22  Derek Jack, MD  fluticasone (CUTIVATE) 0.05 % cream Apply 1 Application topically daily.  01/24/22   [provider]  furosemide (LASIX) 20 MG tablet Take 20 mg by mouth. bid    [provider]  glucose blood (TRUE METRIX BLOOD GLUCOSE TEST) test strip Check BS daily Dx E11.9 09/01/21   Ronnie Doss M, DO  lenalidomide (REVLIMID) 15 MG capsule TAKE 1 CAPSULE BY MOUTH EVERY DAY FOR 21 DAYS ON THEN 7 DAYS OFF 04/21/22   Derek Jack, MD  lidocaine-prilocaine (EMLA) cream Apply 1 Application topically as needed. Apply a small amount to port a cath site and cover with plastic wrap 1 hour prior to infusion appointments 12/08/21   Derek Jack, MD  lovastatin (MEVACOR) 20 MG tablet Take 1 tablet (20 mg total) by mouth at bedtime. 10/30/19   [name], DO  omeprazole (PRILOSEC) 20 MG capsule Take 20 mg by mouth 2 (two) times daily. 08/16/21   [provider]  oxyCODONE-acetaminophen (PERCOCET) 10-325 MG tablet Take 1 tablet by mouth every 6 (six) hours as needed for pain.  [provider]  OXYGEN Inhale 3 L into the lungs continuous.    [provider]  OZEMPIC, 0.25 OR 0.5 MG/DOSE, 2 MG/3ML SOPN Inject into the skin. 12/11/21   [provider]  potassium chloride SA (KLOR-CON) 20 MEQ tablet Take 2 tablets (40 mEq total) by mouth 2 (two) times daily. Patient taking differently: Take 40 mEq by mouth daily. 04/24/20   [name], DO  prochlorperazine (COMPAZINE) 10 MG tablet Take 1 tablet (10 mg total) by mouth every 6 (six) hours as needed for nausea or vomiting. 12/14/21   Derek Jack, MD  torsemide (DEMADEX) 20 MG tablet Take 3 tablets (60 mg total) by mouth 2 (two) times daily. Patient taking differently: Take 50 mg by mouth 2 (two) times daily. 02/18/20 05/06/22  [name], PA-C  TRUEplus Lancets 33G MISC Check BS daily Dx E11.9 09/01/21   Ronnie Doss M, DO  albuterol (PROVENTIL HFA;VENTOLIN HFA) 108 (90 Base) MCG/ACT inhaler Inhale 2 puffs into the lungs every 6 (six) hours as needed for  wheezing or shortness of breath. 09/21/16 09/22/16  Katheren Shams, DO      Allergies    Keflex [cephalexin]    Review of Systems   Review of Systems Negative except as per HPI.  Physical Exam Updated Vital Signs BP (!) 144/52   Pulse 98   Temp 99.3 F (37.4 C)   Resp 18   SpO2 96%  Physical Exam Vitals and nursing note reviewed.  Constitutional:      Appearance: Normal appearance.  HENT:     Head: Normocephalic and atraumatic.     Mouth/Throat:     Mouth: Mucous membranes are moist.  Eyes:     General: No scleral icterus. Cardiovascular:     Rate and Rhythm: Normal rate and regular rhythm.     Pulses: Normal pulses.     Heart sounds: Normal heart sounds.  Pulmonary:     Effort: Pulmonary effort is normal.     Breath sounds: Normal breath sounds.  Abdominal:     General: Abdomen is flat.     Palpations: Abdomen is soft.     Tenderness: There is no abdominal tenderness.  Musculoskeletal:        General: No deformity.  Skin:    General: Skin is warm.     Findings: No rash.     Comments: 5 cm L-shaped laceration on right lower extremity with mild weeping fluid.  Bleeding is controlled.  Neurological:     General: No focal deficit present.     Mental Status: She is alert.  Psychiatric:        Mood and Affect: Mood normal.     ED Results / Procedures / Treatments   Labs (all labs ordered are listed, but only abnormal results are displayed) Labs Reviewed - No data to display  EKG None  Radiology No results found.  Procedures .Marland KitchenLaceration Repair  Date/Time: 05/23/2022 11:02 PM  Performed by: Rex Kras, PA Authorized by: Rex Kras, PA   Consent:    Consent obtained:  Verbal   Consent given by:  Patient   Risks discussed:  Infection and pain   Alternatives discussed:  No treatment Universal protocol:    Procedure explained and questions answered to patient or proxy's satisfaction: yes     Relevant documents present and verified: yes     Patient identity  confirmed:  Verbally with patient and arm band Anesthesia:    Anesthesia method:  Local  infiltration   Local anesthetic:  Lidocaine 1% WITH epi Laceration details:    Location:  Leg   Leg location:  R lower leg   Length (cm):  5   Depth (mm):  3 Pre-procedure details:    Preparation:  Patient was prepped and draped in usual sterile fashion Exploration:    Limited defect created (wound extended): yes     Hemostasis achieved with:  Direct pressure   Imaging outcome: foreign body not noted     Wound exploration: wound explored through full range of motion     Contaminated: no   Treatment:    Area cleansed with:  Povidone-iodine   Amount of cleaning:  Extensive   Irrigation solution:  Sterile water and sterile saline   Irrigation volume:  300   Irrigation method:  Pressure wash   Visualized foreign bodies/material removed: no     Debridement:  Minimal   Undermining:  Minimal   Scar revision: yes   Skin repair:    Repair method:  Sutures   Number of sutures:  5 Approximation:    Approximation:  Close Repair type:    Repair type:  Simple Post-procedure details:    Dressing:  Antibiotic ointment   Procedure completion:  Tolerated well, no immediate complications     Medications Ordered in ED Medications  bacitracin ointment (has no administration in time range)  lidocaine-EPINEPHrine (XYLOCAINE W/EPI) 2 %-1:200000 (PF) injection 20 mL (20 mLs Infiltration Given by Other 05/23/22 2251)  Tdap (BOOSTRIX) injection 0.5 mL (0.5 mLs Intramuscular Given 05/23/22 2307)  oxyCODONE-acetaminophen (PERCOCET/ROXICET) 5-325 MG per tablet 1 tablet (1 tablet Oral Given 05/23/22 2308)    ED Course/ Medical Decision Making/ A&P                             Medical Decision Making Risk OTC drugs. Prescription drug management.   This patient presents to the ED for laceration, this involves an extensive number of treatment options, and is a complaint that carries with a high risk of  complications and morbidity.  The differential diagnosis includes laceration, bone fracture, dislocation, foreign body.  This is not an exhaustive list.  Lab tests: I ordered and personally interpreted labs.  The pertinent results include: WBC unremarkable. Hbg unremarkable. Platelets unremarkable. Electrolytes unremarkable. BUN, creatinine unremarkable. UA significant for no acute abnormality.   Problem list/ ED course/ Critical interventions/ Medical management: HPI: See above Vital signs within normal range and stable throughout visit. Laboratory/imaging studies significant for: See above. On physical examination, patient is afebrile and appears in no acute distress. Wound inspected under direct bright light with good visualization. Area with linear laceration across soft tissue through adipose without exposure of muscle belly or tendon. No overt foreign body. Area hemostatic. Neurovascular exam congruent with above. Area extensively irrigated with sterile normal saline under pressure. Laceration repaired in simple fashion as above (please see procedure note for further details). Patient tolerated procedure well and neurovascular exam intact and unchanged post repair with intact distal pulses and cap refill. Cautious return precautions discussed w/ full understanding. Wound care discussed.  Tetanus shot given.  Prompt follow up with primary care physician discussed and return for suture removal in 14 days. I have reviewed the patient home medicines and have made adjustments as needed.  Cardiac monitoring/EKG: The patient was maintained on a cardiac monitor.  I personally reviewed and interpreted the cardiac monitor which showed an underlying rhythm of: sinus rhythm.  Additional history obtained: External records from outside source obtained and reviewed including: Chart review including previous notes, labs, imaging.  Disposition Continued outpatient therapy. Follow-up with PCP recommended for  reevaluation of symptoms. Treatment plan discussed with patient.  Pt acknowledged understanding was agreeable to the plan. Worrisome signs and symptoms were discussed with patient, and patient acknowledged understanding to return to the ED if they noticed these signs and symptoms. Patient was stable upon discharge.   This chart was dictated using voice recognition software.  Despite best efforts to proofread,  errors can occur which can change the documentation meaning.          Final Clinical Impression(s) / ED Diagnoses Final diagnoses:  Laceration of right lower extremity, initial encounter    Rx / DC Orders ED Discharge Orders          Ordered    bacitracin ointment  2 times daily        05/23/22 2308              Rex Kras, [place] 05/23/22 2332    Noemi Chapel, MD 05/23/22 2340

## 2022-05-23 NOTE — ED Triage Notes (Signed)
Pt with chronic edema of her legs accidentally injusted her leg on her car door.  Pt has a deep skin tear/lac to right calf which is wheeping.  No bleeding at this time.  Tetanus unknown

## 2022-05-23 NOTE — Discharge Instructions (Signed)
Please return to an urgent care or primary care physician in 10 to 14 days for suture removal.  Apply bacitracin ointment to prevent infection.  Take tylenol/ibuprofen for pain. Please do not hesitate to return to emergency department if worrisome signs symptoms we discussed become apparent.

## 2022-05-23 NOTE — Progress Notes (Signed)
The patient has been taking dexamethasone at home prior to daratumumab injections. After discussion with the provider, the offset time of daratumumab was changed to 30 minutes, starting 05/24/2022, in order to reduce patient wait time. A calendar was not sent to the patient because she has already been taking dexamethasone at home.  Laray Anger, PharmD PGY-2 Pharmacy Resident Hematology/Oncology [phone]  05/23/2022 4:15 PM

## 2022-05-24 ENCOUNTER — Inpatient Hospital Stay: Payer: Medicare PPO

## 2022-05-24 ENCOUNTER — Other Ambulatory Visit: Payer: Self-pay

## 2022-05-24 VITALS — BP 115/57 | HR 82 | Temp 98.5°F | Resp 18 | Wt 290.1 lb

## 2022-05-24 VITALS — BP 116/44 | HR 73 | Temp 99.0°F | Resp 18

## 2022-05-24 DIAGNOSIS — C9000 Multiple myeloma not having achieved remission: Secondary | ICD-10-CM

## 2022-05-24 DIAGNOSIS — Z95828 Presence of other vascular implants and grafts: Secondary | ICD-10-CM

## 2022-05-24 DIAGNOSIS — I10 Essential (primary) hypertension: Secondary | ICD-10-CM | POA: Diagnosis not present

## 2022-05-24 DIAGNOSIS — Z5112 Encounter for antineoplastic immunotherapy: Secondary | ICD-10-CM | POA: Diagnosis not present

## 2022-05-24 DIAGNOSIS — Z7961 Long term (current) use of immunomodulator: Secondary | ICD-10-CM | POA: Diagnosis not present

## 2022-05-24 DIAGNOSIS — D500 Iron deficiency anemia secondary to blood loss (chronic): Secondary | ICD-10-CM

## 2022-05-24 DIAGNOSIS — E785 Hyperlipidemia, unspecified: Secondary | ICD-10-CM | POA: Diagnosis not present

## 2022-05-24 DIAGNOSIS — Z79899 Other long term (current) drug therapy: Secondary | ICD-10-CM | POA: Diagnosis not present

## 2022-05-24 DIAGNOSIS — E119 Type 2 diabetes mellitus without complications: Secondary | ICD-10-CM | POA: Diagnosis not present

## 2022-05-24 DIAGNOSIS — R0602 Shortness of breath: Secondary | ICD-10-CM | POA: Diagnosis not present

## 2022-05-24 LAB — CBC WITH DIFFERENTIAL/PLATELET
Abs Immature Granulocytes: 0.01 10*3/uL (ref 0.00–0.07)
Basophils Absolute: 0 10*3/uL (ref 0.0–0.1)
Basophils Relative: 0 %
Eosinophils Absolute: 0.1 10*3/uL (ref 0.0–0.5)
Eosinophils Relative: 2 %
HCT: 27 % — ABNORMAL LOW (ref 36.0–46.0)
Hemoglobin: 8.2 g/dL — ABNORMAL LOW (ref 12.0–15.0)
Immature Granulocytes: 0 %
Lymphocytes Relative: 33 %
Lymphs Abs: 1 10*3/uL (ref 0.7–4.0)
MCH: 29.8 pg (ref 26.0–34.0)
MCHC: 30.4 g/dL (ref 30.0–36.0)
MCV: 98.2 fL (ref 80.0–100.0)
Monocytes Absolute: 0.5 10*3/uL (ref 0.1–1.0)
Monocytes Relative: 17 %
Neutro Abs: 1.4 10*3/uL — ABNORMAL LOW (ref 1.7–7.7)
Neutrophils Relative %: 48 %
Platelets: 107 10*3/uL — ABNORMAL LOW (ref 150–400)
RBC: 2.75 MIL/uL — ABNORMAL LOW (ref 3.87–5.11)
RDW: 17.2 % — ABNORMAL HIGH (ref 11.5–15.5)
WBC: 3 10*3/uL — ABNORMAL LOW (ref 4.0–10.5)
nRBC: 0 % (ref 0.0–0.2)

## 2022-05-24 LAB — SAMPLE TO BLOOD BANK

## 2022-05-24 MED ORDER — LENALIDOMIDE 15 MG PO CAPS: ORAL_CAPSULE | ORAL | 0 refills | Status: DC

## 2022-05-24 MED ORDER — SODIUM CHLORIDE 0.9% FLUSH
10.0000 mL | Freq: Once | INTRAVENOUS | Status: AC
  Administered 2022-05-24: 10 mL via INTRAVENOUS

## 2022-05-24 MED ORDER — SODIUM CHLORIDE 0.9 % IV SOLN: Freq: Once | INTRAVENOUS | Status: AC

## 2022-05-24 MED ORDER — SODIUM CHLORIDE 0.9% FLUSH
10.0000 mL | Freq: Once | INTRAVENOUS | Status: AC | PRN
  Administered 2022-05-24: 10 mL

## 2022-05-24 MED ORDER — HEPARIN SOD (PORK) LOCK FLUSH 100 UNIT/ML IV SOLN
500.0000 [IU] | Freq: Once | INTRAVENOUS | Status: AC | PRN
  Administered 2022-05-24: 500 [IU]

## 2022-05-24 MED ORDER — DARATUMUMAB-HYALURONIDASE-FIHJ 1800-30000 MG-UT/15ML ~~LOC~~ SOLN
1800.0000 mg | Freq: Once | SUBCUTANEOUS | Status: AC
  Administered 2022-05-24: 1800 mg via SUBCUTANEOUS
  Filled 2022-05-24: qty 15

## 2022-05-24 MED ORDER — DEXAMETHASONE 4 MG PO TABS: 20.0000 mg | ORAL_TABLET | ORAL | 3 refills | Status: DC

## 2022-05-24 MED ORDER — SODIUM CHLORIDE 0.9 % IV SOLN
400.0000 mg | Freq: Once | INTRAVENOUS | Status: AC
  Administered 2022-05-24: 400 mg via INTRAVENOUS
  Filled 2022-05-24: qty 20

## 2022-05-24 MED ORDER — DEXAMETHASONE 4 MG PO TABS
20.0000 mg | ORAL_TABLET | Freq: Once | ORAL | Status: AC
  Administered 2022-05-24: 20 mg via ORAL
  Filled 2022-05-24: qty 5

## 2022-05-24 NOTE — Patient Instructions (Signed)
[facility]  Discharge Instructions: Thank you for choosing [facility] to provide your oncology and hematology care.  If you have a lab appointment with the Cancer Center, please come in thru the Main Entrance and check in at the main information desk.  Wear comfortable clothing and clothing appropriate for easy access to any Portacath or PICC line.   We strive to give you quality time with your provider. You may need to reschedule your appointment if you arrive late (15 or more minutes).  Arriving late affects you and other patients whose appointments are after yours.  Also, if you miss three or more appointments without notifying the office, you may be dismissed from the clinic at the provider's discretion.      For prescription refill requests, have your pharmacy contact our office and allow 72 hours for refills to be completed.    Today you received the following chemotherapy and/or immunotherapy agents Daratumumab.  Daratumumab; Hyaluronidase Injection What is this medication? DARATUMUMAB; HYALURONIDASE (dar a toom ue mab; hye al ur ON i dase) treats multiple myeloma, a type of bone marrow cancer. Daratumumab works by blocking a protein that causes cancer cells to grow and multiply. This helps to slow or stop the spread of cancer cells. Hyaluronidase works by increasing the absorption of other medications in the body to help them work better. This medication may also be used treat amyloidosis, a condition that causes the buildup of a protein (amyloid) in your body. It works by reducing the buildup of this protein, which decreases symptoms. It is a combination medication that contains a monoclonal antibody. This medicine may be used for other purposes; ask your health care provider or pharmacist if you have questions. COMMON BRAND NAME(S): DARZALEX FASPRO What should I tell my care team before I take this medication? They need to know if you have any of  these conditions: Heart disease Infection, such as chickenpox, cold sores, herpes, hepatitis B Lung or breathing disease An unusual or allergic reaction to daratumumab, hyaluronidase, other medications, foods, dyes, or preservatives Pregnant or trying to get pregnant Breast-feeding How should I use this medication? This medication is injected under the skin. It is given by your care team in a hospital or clinic setting. Talk to your care team about the use of this medication in children. Special care may be needed. Overdosage: If you think you have taken too much of this medicine contact a poison control center or emergency room at once. NOTE: This medicine is only for you. Do not share this medicine with others. What if I miss a dose? Keep appointments for follow-up doses. It is important not to miss your dose. Call your care team if you are unable to keep an appointment. What may interact with this medication? Interactions have not been studied. This list may not describe all possible interactions. Give your health care provider a list of all the medicines, herbs, non-prescription drugs, or dietary supplements you use. Also tell them if you smoke, drink alcohol, or use illegal drugs. Some items may interact with your medicine. What should I watch for while using this medication? Your condition will be monitored carefully while you are receiving this medication. This medication can cause serious allergic reactions. To reduce your risk, your care team may give you other medication to take before receiving this one. Be sure to follow the directions from your care team. This medication can affect the results of blood tests to match   your blood type. These changes can last for up to 6 months after the final dose. Your care team will do blood tests to match your blood type before you start treatment. Tell all of your care team that you are being treated with this medication before receiving a blood  transfusion. This medication can affect the results of some tests used to determine treatment response; extra tests may be needed to evaluate response. Talk to your care team if you wish to become pregnant or think you are pregnant. This medication can cause serious birth defects if taken during pregnancy and for 3 months after the last dose. A reliable form of contraception is recommended while taking this medication and for 3 months after the last dose. Talk to your care team about effective forms of contraception. Do not breast-feed while taking this medication. What side effects may I notice from receiving this medication? Side effects that you should report to your care team as soon as possible: Allergic reactions--skin rash, itching, hives, swelling of the face, lips, tongue, or throat Heart rhythm changes--fast or irregular heartbeat, dizziness, feeling faint or lightheaded, chest pain, trouble breathing Infection--fever, chills, cough, sore throat, wounds that don't heal, pain or trouble when passing urine, general feeling of discomfort or being unwell Infusion reactions--chest pain, shortness of breath or trouble breathing, feeling faint or lightheaded Sudden eye pain or change in vision such as blurry vision, seeing halos around lights, vision loss Unusual bruising or bleeding Side effects that usually do not require medical attention (report to your care team if they continue or are bothersome): Constipation Diarrhea Fatigue Nausea Pain, tingling, or numbness in the hands or feet Swelling of the ankles, hands, or feet This list may not describe all possible side effects. Call your doctor for medical advice about side effects. You may report side effects to FDA at 1-800-FDA-1088. Where should I keep my medication? This medication is given in a hospital or clinic. It will not be stored at home. NOTE: This sheet is a summary. It may not cover all possible information. If you have  questions about this medicine, talk to your doctor, pharmacist, or health care provider.  2023 Elsevier/Gold Standard (2021-07-07 00:00:00)    Iron Sucrose Injection What is this medication? IRON SUCROSE (EYE ern SOO krose) treats low levels of iron (iron deficiency anemia) in people with kidney disease. Iron is a mineral that plays an important role in making red blood cells, which carry oxygen from your lungs to the rest of your body. This medicine may be used for other purposes; ask your health care provider or pharmacist if you have questions. COMMON BRAND NAME(S): Venofer What should I tell my care team before I take this medication? They need to know if you have any of these conditions: Anemia not caused by low iron levels Heart disease High levels of iron in the blood Kidney disease Liver disease An unusual or allergic reaction to iron, other medications, foods, dyes, or preservatives Pregnant or trying to get pregnant Breastfeeding How should I use this medication? This medication is for infusion into a vein. It is given in a hospital or clinic setting. Talk to your care team about the use of this medication in children. While this medication may be prescribed for children as young as 2 years for selected conditions, precautions do apply. Overdosage: If you think you have taken too much of this medicine contact a poison control center or emergency room at once. NOTE: This medicine is  only for you. Do not share this medicine with others. What if I miss a dose? Keep appointments for follow-up doses. It is important not to miss your dose. Call your care team if you are unable to keep an appointment. What may interact with this medication? Do not take this medication with any of the following: Deferoxamine Dimercaprol Other iron products This medication may also interact with the following: Chloramphenicol Deferasirox This list may not describe all possible interactions. Give  your health care provider a list of all the medicines, herbs, non-prescription drugs, or dietary supplements you use. Also tell them if you smoke, drink alcohol, or use illegal drugs. Some items may interact with your medicine. What should I watch for while using this medication? Visit your care team regularly. Tell your care team if your symptoms do not start to get better or if they get worse. You may need blood work done while you are taking this medication. You may need to follow a special diet. Talk to your care team. Foods that contain iron include: whole grains/cereals, dried fruits, beans, or peas, leafy green vegetables, and organ meats (liver, kidney). What side effects may I notice from receiving this medication? Side effects that you should report to your care team as soon as possible: Allergic reactions--skin rash, itching, hives, swelling of the face, lips, tongue, or throat Low blood pressure--dizziness, feeling faint or lightheaded, blurry vision Shortness of breath Side effects that usually do not require medical attention (report to your care team if they continue or are bothersome): Flushing Headache Joint pain Muscle pain Nausea Pain, redness, or irritation at injection site This list may not describe all possible side effects. Call your doctor for medical advice about side effects. You may report side effects to FDA at 1-800-FDA-1088. Where should I keep my medication? This medication is given in a hospital or clinic and will not be stored at home. NOTE: This sheet is a summary. It may not cover all possible information. If you have questions about this medicine, talk to your doctor, pharmacist, or health care provider.  2023 Elsevier/Gold Standard (2020-06-25 00:00:00)        To help prevent nausea and vomiting after your treatment, we encourage you to take your nausea medication as directed.  BELOW ARE SYMPTOMS THAT SHOULD BE REPORTED IMMEDIATELY: *FEVER GREATER  THAN 100.4 F (38 C) OR HIGHER *CHILLS OR SWEATING *NAUSEA AND VOMITING THAT IS NOT CONTROLLED WITH YOUR NAUSEA MEDICATION *UNUSUAL SHORTNESS OF BREATH *UNUSUAL BRUISING OR BLEEDING *URINARY PROBLEMS (pain or burning when urinating, or frequent urination) *BOWEL PROBLEMS (unusual diarrhea, constipation, pain near the anus) TENDERNESS IN MOUTH AND THROAT WITH OR WITHOUT PRESENCE OF ULCERS (sore throat, sores in mouth, or a toothache) UNUSUAL RASH, SWELLING OR PAIN  UNUSUAL VAGINAL DISCHARGE OR ITCHING   Items with * indicate a potential emergency and should be followed up as soon as possible or go to the Emergency Department if any problems should occur.  Please show the CHEMOTHERAPY ALERT CARD or IMMUNOTHERAPY ALERT CARD at check-in to the Emergency Department and triage nurse.  Should you have questions after your visit or need to cancel or reschedule your appointment, please contact [facility] [phone]  and follow the prompts.  Office hours are 8:00 a.m. to 4:30 p.m. Monday - Friday. Please note that voicemails left after 4:00 p.m. may not be returned until the following business day.  We are closed weekends and major holidays. You have access to a nurse  at all times for urgent questions. Please call the main number to the clinic [phone] and follow the prompts.  For any non-urgent questions, you may also contact your provider using MyChart. We now offer e-Visits for anyone 73 and older to request care online for non-urgent symptoms. For details visit [email or web address].   Also download the MyChart app! Go to the app store, search "MyChart", open the app, select , and log in with your MyChart username and password.

## 2022-05-24 NOTE — Telephone Encounter (Signed)
Chart reviewed. Revlimid refilled per last office note with Dr. Katragadda.  

## 2022-05-24 NOTE — Progress Notes (Signed)
Patient presents today for Daratumumab injection and Venofer 400 mg infusion.  Patient is in satisfactory condition with no new complaints voiced. Vital signs are stable. Tylenol and Zyrtec were taken at 0730 at home prior to visit. We will give Decadron 20 mg here as patient was out of this at home and did not take it.  Labs reviewed.  All labs are within treatment parameters.  We will proceed with treatments per MD orders.   Patient tolerated Daratumumab injection and Venofer infusion well with no complaints voiced.  Patient left via wheelchair with granddaughter in stable condition.  Vital signs stable at discharge.  Follow up as scheduled.

## 2022-05-30 ENCOUNTER — Telehealth: Payer: Self-pay

## 2022-05-30 NOTE — Telephone Encounter (Signed)
        Patient  visited [facility] on 2/26     Telephone encounter attempt :  1st  A HIPAA compliant voice message was left requesting a return call.  Instructed patient to call back.    [facility] [phone] [address], [place], [place] [address] Phone: [phone] Email: [email or web address]

## 2022-05-31 ENCOUNTER — Telehealth: Payer: Self-pay

## 2022-05-31 ENCOUNTER — Ambulatory Visit: Payer: Medicare PPO

## 2022-05-31 ENCOUNTER — Ambulatory Visit: Payer: Medicare PPO | Admitting: Hematology

## 2022-05-31 ENCOUNTER — Other Ambulatory Visit: Payer: Medicare PPO

## 2022-05-31 DIAGNOSIS — C9000 Multiple myeloma not having achieved remission: Secondary | ICD-10-CM | POA: Diagnosis not present

## 2022-05-31 DIAGNOSIS — Z79899 Other long term (current) drug therapy: Secondary | ICD-10-CM | POA: Diagnosis not present

## 2022-05-31 DIAGNOSIS — F1120 Opioid dependence, uncomplicated: Secondary | ICD-10-CM | POA: Diagnosis not present

## 2022-05-31 DIAGNOSIS — Z6841 Body Mass Index (BMI) 40.0 and over, adult: Secondary | ICD-10-CM | POA: Diagnosis not present

## 2022-05-31 DIAGNOSIS — M5136 Other intervertebral disc degeneration, lumbar region: Secondary | ICD-10-CM | POA: Diagnosis not present

## 2022-05-31 DIAGNOSIS — R030 Elevated blood-pressure reading, without diagnosis of hypertension: Secondary | ICD-10-CM | POA: Diagnosis not present

## 2022-05-31 DIAGNOSIS — G894 Chronic pain syndrome: Secondary | ICD-10-CM | POA: Diagnosis not present

## 2022-05-31 DIAGNOSIS — E119 Type 2 diabetes mellitus without complications: Secondary | ICD-10-CM | POA: Diagnosis not present

## 2022-05-31 NOTE — Telephone Encounter (Signed)
        Patient  visited [facility] on 2/26     Telephone encounter attempt :  2nd  A HIPAA compliant voice message was left requesting a return call.  Instructed patient to call back     [facility] [phone] [address], [place], [place] [address] Phone: [phone] Email: [email or web address]

## 2022-06-02 ENCOUNTER — Encounter: Payer: Self-pay | Admitting: Family Medicine

## 2022-06-02 ENCOUNTER — Ambulatory Visit: Payer: Medicare PPO | Admitting: Family Medicine

## 2022-06-02 VITALS — BP 134/67 | HR 83 | Temp 98.9°F | Ht 62.0 in | Wt 288.0 lb

## 2022-06-02 DIAGNOSIS — S81811D Laceration without foreign body, right lower leg, subsequent encounter: Secondary | ICD-10-CM

## 2022-06-02 DIAGNOSIS — S81811A Laceration without foreign body, right lower leg, initial encounter: Secondary | ICD-10-CM

## 2022-06-02 MED ORDER — SULFAMETHOXAZOLE-TRIMETHOPRIM 800-160 MG PO TABS: 1.0000 | ORAL_TABLET | Freq: Two times a day (BID) | ORAL | 0 refills | Status: DC

## 2022-06-02 NOTE — Progress Notes (Signed)
Acute Office Visit  Subjective:  Patient ID: Savannah Burns, female    DOB: 25-Nov-1950, 72 y.o.   MRN: [id number]  Chief Complaint  Patient presents with   right leg infection, suture site    HPI Patient is in today for infection of sutures. Laceration occurred 05/23/22. Presented to the ED where she received sutures.  Noticed the infection Sunday, March 5th. Saw pain clinic on the 5th and they stated she was infected. States that she had a fever of 100.1 yesterday.  Was wrapping it with "Kotex" with excessive weeping.   ROS As per HPI  Objective:  BP 134/67   Pulse 83   Temp 98.9 F (37.2 C)   Ht '5\' 2"'$  (1.575 m)   Wt 288 lb (130.6 kg)   SpO2 95%   BMI 52.68 kg/m    Physical Exam Constitutional:      General: She is not in acute distress.    Appearance: Normal appearance. She is not ill-appearing, toxic-appearing or diaphoretic.  Cardiovascular:     Rate and Rhythm: Normal rate.     Pulses: Normal pulses.     Heart sounds: Normal heart sounds. No murmur heard.    No gallop.  Pulmonary:     Effort: Pulmonary effort is normal. No respiratory distress.     Breath sounds: Normal breath sounds. No stridor. No wheezing, rhonchi or rales.  Skin:    General: Skin is warm.     Capillary Refill: Capillary refill takes less than 2 seconds.     Findings: Abrasion, laceration and wound present.     Comments: Approx 5 cm laceration with surrounding erythema. Warm to touch. Purulent drainage from wound. Eschar present at wound.   Neurological:     General: No focal deficit present.     Mental Status: She is alert and oriented to person, place, and time. Mental status is at baseline.     Motor: No weakness.  Psychiatric:        Mood and Affect: Mood normal.        Behavior: Behavior normal.        Thought Content: Thought content normal.        Judgment: Judgment normal.    Suture Removal  Date/Time: 06/02/2022 9:55 AM  Performed by: Pryor Ochoa,  FNP Authorized by: Pryor Ochoa, FNP  Body area: lower extremity Location details: right lower leg Wound Appearance: warm, tender, purulent and draining Post-removal: dressing applied Patient tolerance: patient tolerated the procedure well with no immediate complications Comments: Covered with xeroform and dry gauze     Assessment & Plan:  1. Skin tear of right lower leg without complication, initial encounter Sutures removed in clinic today. Medication as below. Strict return instructions given to patient. - sulfamethoxazole-trimethoprim (BACTRIM DS) 800-160 MG tablet; Take 1 tablet by mouth 2 (two) times daily for 10 days.  Dispense: 20 tablet; Refill: 0   The above assessment and management plan was discussed with the patient. The patient verbalized understanding of and has agreed to the management plan using shared-decision making. Patient is aware to call the clinic if they develop any new symptoms or if symptoms fail to improve or worsen. Patient is aware when to return to the clinic for a follow-up visit. Patient educated on when it is appropriate to go to the emergency department.   Follow up 1 week for wound check   Donzetta Kohut, DNP-FNP [facility] [address] [place], [place] [address] [phone]  [phone] 

## 2022-06-03 ENCOUNTER — Ambulatory Visit (HOSPITAL_COMMUNITY)
Admission: RE | Admit: 2022-06-03 | Discharge: 2022-06-03 | Disposition: A | Discharge status: Home / Self Care | Payer: Medicare PPO | Source: Ambulatory Visit | Attending: Cardiology | Admitting: Cardiology

## 2022-06-03 DIAGNOSIS — I5032 Chronic diastolic (congestive) heart failure: Secondary | ICD-10-CM | POA: Diagnosis not present

## 2022-06-03 LAB — ECHOCARDIOGRAM COMPLETE
AR max vel: 1.06 cm2
AV Area VTI: 1.12 cm2
AV Area mean vel: 0.98 cm2
AV Mean grad: 23 mmHg
AV Peak grad: 46.8 mmHg
Ao pk vel: 3.42 m/s
Area-P 1/2: 2.22 cm2
Est EF: >75
S' Lateral: 2.4 cm

## 2022-06-03 NOTE — Progress Notes (Signed)
*  PRELIMINARY RESULTS* Echocardiogram 2D Echocardiogram has been performed.  Savannah Burns 06/03/2022, 4:02 PM

## 2022-06-07 ENCOUNTER — Inpatient Hospital Stay: Payer: Medicare PPO | Attending: Hematology

## 2022-06-07 ENCOUNTER — Inpatient Hospital Stay: Payer: Medicare PPO

## 2022-06-07 VITALS — BP 114/43 | HR 67 | Temp 97.3°F | Resp 16 | Wt 294.5 lb

## 2022-06-07 DIAGNOSIS — C9000 Multiple myeloma not having achieved remission: Secondary | ICD-10-CM | POA: Insufficient documentation

## 2022-06-07 DIAGNOSIS — Z87442 Personal history of urinary calculi: Secondary | ICD-10-CM | POA: Diagnosis not present

## 2022-06-07 DIAGNOSIS — E119 Type 2 diabetes mellitus without complications: Secondary | ICD-10-CM | POA: Diagnosis not present

## 2022-06-07 DIAGNOSIS — D500 Iron deficiency anemia secondary to blood loss (chronic): Secondary | ICD-10-CM

## 2022-06-07 DIAGNOSIS — E785 Hyperlipidemia, unspecified: Secondary | ICD-10-CM | POA: Diagnosis not present

## 2022-06-07 DIAGNOSIS — Z5112 Encounter for antineoplastic immunotherapy: Secondary | ICD-10-CM | POA: Insufficient documentation

## 2022-06-07 DIAGNOSIS — K7460 Unspecified cirrhosis of liver: Secondary | ICD-10-CM | POA: Diagnosis not present

## 2022-06-07 DIAGNOSIS — D3502 Benign neoplasm of left adrenal gland: Secondary | ICD-10-CM | POA: Insufficient documentation

## 2022-06-07 DIAGNOSIS — D509 Iron deficiency anemia, unspecified: Secondary | ICD-10-CM | POA: Diagnosis not present

## 2022-06-07 LAB — CBC WITH DIFFERENTIAL/PLATELET
Abs Immature Granulocytes: 0.01 10*3/uL (ref 0.00–0.07)
Basophils Absolute: 0 10*3/uL (ref 0.0–0.1)
Basophils Relative: 0 %
Eosinophils Absolute: 0.1 10*3/uL (ref 0.0–0.5)
Eosinophils Relative: 3 %
HCT: 28.4 % — ABNORMAL LOW (ref 36.0–46.0)
Hemoglobin: 8.6 g/dL — ABNORMAL LOW (ref 12.0–15.0)
Immature Granulocytes: 0 %
Lymphocytes Relative: 32 %
Lymphs Abs: 1.1 10*3/uL (ref 0.7–4.0)
MCH: 29.5 pg (ref 26.0–34.0)
MCHC: 30.3 g/dL (ref 30.0–36.0)
MCV: 97.3 fL (ref 80.0–100.0)
Monocytes Absolute: 0.4 10*3/uL (ref 0.1–1.0)
Monocytes Relative: 13 %
Neutro Abs: 1.7 10*3/uL (ref 1.7–7.7)
Neutrophils Relative %: 52 %
Platelets: 101 10*3/uL — ABNORMAL LOW (ref 150–400)
RBC: 2.92 MIL/uL — ABNORMAL LOW (ref 3.87–5.11)
RDW: 16.7 % — ABNORMAL HIGH (ref 11.5–15.5)
WBC: 3.4 10*3/uL — ABNORMAL LOW (ref 4.0–10.5)
nRBC: 0 % (ref 0.0–0.2)

## 2022-06-07 LAB — COMPREHENSIVE METABOLIC PANEL
ALT: 12 U/L (ref 0–44)
AST: 20 U/L (ref 15–41)
Albumin: 3.1 g/dL — ABNORMAL LOW (ref 3.5–5.0)
Alkaline Phosphatase: 82 U/L (ref 38–126)
Anion gap: 3 — ABNORMAL LOW (ref 5–15)
BUN: 14 mg/dL (ref 8–23)
CO2: 27 mmol/L (ref 22–32)
Calcium: 7.9 mg/dL — ABNORMAL LOW (ref 8.9–10.3)
Chloride: 105 mmol/L (ref 98–111)
Creatinine, Ser: 0.67 mg/dL (ref 0.44–1.00)
GFR, Estimated: >60 mL/min (ref >60)
Glucose, Bld: 153 mg/dL — ABNORMAL HIGH (ref 70–99)
Potassium: 3.9 mmol/L (ref 3.5–5.1)
Sodium: 135 mmol/L (ref 135–145)
Total Bilirubin: 0.5 mg/dL (ref 0.3–1.2)
Total Protein: 5.8 g/dL — ABNORMAL LOW (ref 6.5–8.1)

## 2022-06-07 LAB — IRON AND TIBC
Iron: 100 ug/dL (ref 28–170)
Saturation Ratios: 27 % (ref 10.4–31.8)
TIBC: 375 ug/dL (ref 250–450)
UIBC: 275 ug/dL

## 2022-06-07 LAB — FERRITIN: Ferritin: 51 ng/mL (ref 11–307)

## 2022-06-07 LAB — MAGNESIUM: Magnesium: 1.8 mg/dL (ref 1.7–2.4)

## 2022-06-07 MED ORDER — SODIUM CHLORIDE 0.9 % IV SOLN
400.0000 mg | Freq: Once | INTRAVENOUS | Status: AC
  Administered 2022-06-07: 400 mg via INTRAVENOUS
  Filled 2022-06-07: qty 20

## 2022-06-07 MED ORDER — HEPARIN SOD (PORK) LOCK FLUSH 100 UNIT/ML IV SOLN
500.0000 [IU] | Freq: Once | INTRAVENOUS | Status: AC | PRN
  Administered 2022-06-07: 500 [IU]

## 2022-06-07 MED ORDER — DARATUMUMAB-HYALURONIDASE-FIHJ 1800-30000 MG-UT/15ML ~~LOC~~ SOLN
1800.0000 mg | Freq: Once | SUBCUTANEOUS | Status: AC
  Administered 2022-06-07: 1800 mg via SUBCUTANEOUS
  Filled 2022-06-07: qty 15

## 2022-06-07 MED ORDER — LORATADINE 10 MG PO TABS: 10.0000 mg | ORAL_TABLET | Freq: Once | ORAL | Status: DC

## 2022-06-07 MED ORDER — SODIUM CHLORIDE 0.9 % IV SOLN: Freq: Once | INTRAVENOUS | Status: AC

## 2022-06-07 MED ORDER — SODIUM CHLORIDE 0.9% FLUSH
10.0000 mL | Freq: Once | INTRAVENOUS | Status: AC | PRN
  Administered 2022-06-07: 10 mL

## 2022-06-07 MED ORDER — DEXAMETHASONE 4 MG PO TABS
20.0000 mg | ORAL_TABLET | Freq: Once | ORAL | Status: AC
  Administered 2022-06-07: 20 mg via ORAL
  Filled 2022-06-07: qty 5

## 2022-06-07 MED ORDER — CETIRIZINE HCL 10 MG PO TABS: 10.0000 mg | ORAL_TABLET | Freq: Once | ORAL | Status: DC

## 2022-06-07 NOTE — Progress Notes (Signed)
Patient presents today for Venofer infusion and Dara treatment. Patient in satisfactory condition with no new complaints voiced. Vital signs stable. Patient took premed, Claritin, at 7:30am prior to arrival. All labs within treatment parameters. Will will continue with treatment per MDs order.  Patient presents today for treatment per orders.  Patient tolerated treatment well with no complaints voiced.  Patient left ambulatory in stable condition.  Vital signs stable at discharge.  Follow up as scheduled.

## 2022-06-07 NOTE — Patient Instructions (Signed)
Savannah Burns  Discharge Instructions: Thank you for choosing [facility] to provide your oncology and hematology care.  If you have a lab appointment with the [facility], please come in thru the Main Entrance and check in at the main information desk.  Wear comfortable clothing and clothing appropriate for easy access to any Portacath or PICC line.   We strive to give you quality time with your provider. You may need to reschedule your appointment if you arrive late (15 or more minutes).  Arriving late affects you and other patients whose appointments are after yours.  Also, if you miss three or more appointments without notifying the office, you may be dismissed from the clinic at the provider's discretion.      For prescription refill requests, have your pharmacy contact our office and allow 72 hours for refills to be completed.    Today you received Venofer '400mg'$  infusion.  Today you received the following chemotherapy and/or immunotherapy agents Daratumumab; Hyaluronidase Injection What is this medication? DARATUMUMAB; HYALURONIDASE (dar a toom ue mab; hye al ur ON i dase) treats multiple myeloma, a type of bone marrow cancer. Daratumumab works by blocking a protein that causes cancer cells to grow and multiply. This helps to slow or stop the spread of cancer cells. Hyaluronidase works by increasing the absorption of other medications in the body to help them work better. This medication may also be used treat amyloidosis, a condition that causes the buildup of a protein (amyloid) in your body. It works by reducing the buildup of this protein, which decreases symptoms. It is a combination medication that contains a monoclonal antibody. This medicine may be used for other purposes; ask your health care provider or pharmacist if you have questions. COMMON BRAND NAME(S): DARZALEX FASPRO What should I tell my care team before I take this medication? They need  to know if you have any of these conditions: Heart disease Infection, such as chickenpox, cold sores, herpes, hepatitis B Lung or breathing disease An unusual or allergic reaction to daratumumab, hyaluronidase, other medications, foods, dyes, or preservatives Pregnant or trying to get pregnant Breast-feeding How should I use this medication? This medication is injected under the skin. It is given by your care team in a hospital or clinic setting. Talk to your care team about the use of this medication in children. Special care may be needed. Overdosage: If you think you have taken too much of this medicine contact a poison control center or emergency room at once. NOTE: This medicine is only for you. Do not share this medicine with others. What if I miss a dose? Keep appointments for follow-up doses. It is important not to miss your dose. Call your care team if you are unable to keep an appointment. What may interact with this medication? Interactions have not been studied. This list may not describe all possible interactions. Give your health care provider a list of all the medicines, herbs, non-prescription drugs, or dietary supplements you use. Also tell them if you smoke, drink alcohol, or use illegal drugs. Some items may interact with your medicine. What should I watch for while using this medication? Your condition will be monitored carefully while you are receiving this medication. This medication can cause serious allergic reactions. To reduce your risk, your care team may give you other medication to take before receiving this one. Be sure to follow the directions from your care team. This medication can affect the results  of blood tests to match your blood type. These changes can last for up to 6 months after the final dose. Your care team will do blood tests to match your blood type before you start treatment. Tell all of your care team that you are being treated with this medication  before receiving a blood transfusion. This medication can affect the results of some tests used to determine treatment response; extra tests may be needed to evaluate response. Talk to your care team if you wish to become pregnant or think you are pregnant. This medication can cause serious birth defects if taken during pregnancy and for 3 months after the last dose. A reliable form of contraception is recommended while taking this medication and for 3 months after the last dose. Talk to your care team about effective forms of contraception. Do not breast-feed while taking this medication. What side effects may I notice from receiving this medication? Side effects that you should report to your care team as soon as possible: Allergic reactions--skin rash, itching, hives, swelling of the face, lips, tongue, or throat Heart rhythm changes--fast or irregular heartbeat, dizziness, feeling faint or lightheaded, chest pain, trouble breathing Infection--fever, chills, cough, sore throat, wounds that don't heal, pain or trouble when passing urine, general feeling of discomfort or being unwell Infusion reactions--chest pain, shortness of breath or trouble breathing, feeling faint or lightheaded Sudden eye pain or change in vision such as blurry vision, seeing halos around lights, vision loss Unusual bruising or bleeding Side effects that usually do not require medical attention (report to your care team if they continue or are bothersome): Constipation Diarrhea Fatigue Nausea Pain, tingling, or numbness in the hands or feet Swelling of the ankles, hands, or feet This list may not describe all possible side effects. Call your doctor for medical advice about side effects. You may report side effects to FDA at 1-800-FDA-1088. Where should I keep my medication? This medication is given in a hospital or clinic. It will not be stored at home. NOTE: This sheet is a summary. It may not cover all possible  information. If you have questions about this medicine, talk to your doctor, pharmacist, or health care provider.  2023 Elsevier/Gold Standard (2021-07-07 00:00:00)       To help prevent nausea and vomiting after your treatment, we encourage you to take your nausea medication as directed.  BELOW ARE SYMPTOMS THAT SHOULD BE REPORTED IMMEDIATELY: *FEVER GREATER THAN 100.4 F (38 C) OR HIGHER *CHILLS OR SWEATING *NAUSEA AND VOMITING THAT IS NOT CONTROLLED WITH YOUR NAUSEA MEDICATION *UNUSUAL SHORTNESS OF BREATH *UNUSUAL BRUISING OR BLEEDING *URINARY PROBLEMS (pain or burning when urinating, or frequent urination) *BOWEL PROBLEMS (unusual diarrhea, constipation, pain near the anus) TENDERNESS IN MOUTH AND THROAT WITH OR WITHOUT PRESENCE OF ULCERS (sore throat, sores in mouth, or a toothache) UNUSUAL RASH, SWELLING OR PAIN  UNUSUAL VAGINAL DISCHARGE OR ITCHING   Items with * indicate a potential emergency and should be followed up as soon as possible or go to the Emergency Department if any problems should occur.  Please show the CHEMOTHERAPY ALERT CARD or IMMUNOTHERAPY ALERT CARD at check-in to the Emergency Department and triage nurse.  Should you have questions after your visit or need to cancel or reschedule your appointment, please contact [facility] [phone]  and follow the prompts.  Office hours are 8:00 a.m. to 4:30 p.m. Monday - Friday. Please note that voicemails left after 4:00 p.m. may not be returned until  the following business day.  We are closed weekends and major holidays. You have access to a nurse at all times for urgent questions. Please call the main number to the clinic [phone] and follow the prompts.  For any non-urgent questions, you may also contact your provider using MyChart. We now offer e-Visits for anyone 12 and older to request care online for non-urgent symptoms. For details visit [email or web address].   Also download the MyChart  app! Go to the app store, search "MyChart", open the app, select [facility], and log in with your MyChart username and password.

## 2022-06-08 ENCOUNTER — Ambulatory Visit: Payer: Medicare PPO | Admitting: Family Medicine

## 2022-06-08 ENCOUNTER — Encounter: Payer: Self-pay | Admitting: Family Medicine

## 2022-06-08 VITALS — BP 139/61 | HR 82 | Temp 98.6°F | Ht 62.0 in | Wt 293.0 lb

## 2022-06-08 DIAGNOSIS — E1142 Type 2 diabetes mellitus with diabetic polyneuropathy: Secondary | ICD-10-CM | POA: Diagnosis not present

## 2022-06-08 DIAGNOSIS — I890 Lymphedema, not elsewhere classified: Secondary | ICD-10-CM | POA: Diagnosis not present

## 2022-06-08 DIAGNOSIS — S81801D Unspecified open wound, right lower leg, subsequent encounter: Secondary | ICD-10-CM

## 2022-06-08 DIAGNOSIS — S81801S Unspecified open wound, right lower leg, sequela: Secondary | ICD-10-CM

## 2022-06-08 LAB — KAPPA/LAMBDA LIGHT CHAINS
Kappa free light chain: 24.1 mg/L — ABNORMAL HIGH (ref 3.3–19.4)
Kappa, lambda light chain ratio: 3.01 — ABNORMAL HIGH (ref 0.26–1.65)
Lambda free light chains: 8 mg/L (ref 5.7–26.3)

## 2022-06-08 MED ORDER — ALPHA-LIPOIC ACID 600 MG PO CAPS: 600.0000 mg | ORAL_CAPSULE | Freq: Every day | ORAL | 3 refills | Status: DC

## 2022-06-08 MED ORDER — SULFAMETHOXAZOLE-TRIMETHOPRIM 800-160 MG PO TABS: 1.0000 | ORAL_TABLET | Freq: Two times a day (BID) | ORAL | 0 refills | Status: AC

## 2022-06-08 NOTE — Progress Notes (Signed)
Subjective: [id number] infected laceration PCP: [name], DO [id number] Savannah Burns is a 72 y.o. female presenting to clinic today for:  1. Laceration Patient sustained a laceration on her right lower leg a few weeks ago.  She had stitches placed by the ER and came in on the seventh for a wound check as she was having some purulence and increased swelling and pain at the site.  Sutures were removed.  She was placed on Septra.  She is here for 1 week recheck. She does report some improvement in the right leg wound.  Unfortunately, she hit her left leg against a bucket and has a weeping scrape on that side now too.  She does not wear compression hose. She does not abide by a low salt diet.  Medical history is significant for Multiple myeloma, anemia.  Tetanus shot was updated 05/23/2022   ROS: Per HPI  Allergies  Allergen Reactions   Keflex [Cephalexin] Nausea And Vomiting   Past Medical History:  Diagnosis Date   Adrenal adenoma, left    Stable   Anxiety    Arthritis    bilateral hands   Depression    Diabetes mellitus, type 2 ([facility]) 08/12/2008   Qualifier: Diagnosis of  By: Deborra Medina MD, Talia     Dyspnea    Esophageal varices (HCC)    Grade II diastolic dysfunction    History of kidney stones    Hyperlipidemia    Hypertension    Lower back pain    Lower GI bleed 03/19/2020   Panic attacks    Pneumonia    currently taking antibiotic and prednisone for early stages of pneumonia   Pulmonary nodules    bilateral   Skin cancer    face    Current Outpatient Medications:    acyclovir (ZOVIRAX) 400 MG tablet, Take 1 tablet (400 mg total) by mouth 2 (two) times daily., Disp: 60 tablet, Rfl: 6   bacitracin ointment, Apply 1 Application topically 2 (two) times daily., Disp: 120 g, Rfl: 0   baclofen (LIORESAL) 10 MG tablet, Take 10 mg by mouth 2 (two) times daily., Disp: , Rfl:    Blood Glucose Calibration (TRUE METRIX LEVEL 1) Low SOLN, Use w/ glucose monitor Dx E11.9,  Disp: 3 each, Rfl: 0   Blood Glucose Monitoring Suppl (TRUE METRIX AIR GLUCOSE METER) w/Device KIT, Check BS daily Dx E11.9, Disp: 1 kit, Rfl: 0   carvedilol (COREG) 6.25 MG tablet, Take 6.25 mg by mouth 2 (two) times daily., Disp: , Rfl:    Cholecalciferol (VITAMIN D) 50 MCG (2000 UT) tablet, Take 2,000 Units by mouth daily., Disp: , Rfl:    clobetasol (TEMOVATE) 0.05 % external solution, Apply topically., Disp: , Rfl:    CONSTULOSE 10 GM/15ML solution, Take by mouth., Disp: , Rfl:    Cyanocobalamin (B-12 COMPLIANCE INJECTION) 1000 MCG/ML KIT, Inject 1,000 mcg as directed every 30 (thirty) days., Disp: , Rfl:    daratumumab-hyaluronidase-fihj (DARZALEX FASPRO) 1800-30000 MG-UT/15ML SOLN, Inject 1,800 mg into the skin once a week., Disp: , Rfl:    desvenlafaxine (PRISTIQ) 100 MG 24 hr tablet, Take 1 tablet (100 mg total) by mouth daily., Disp: 90 tablet, Rfl: 3   dexamethasone (DECADRON) 4 MG tablet, Take 5 tablets (20 mg total) by mouth once a week., Disp: 20 tablet, Rfl: 3   esomeprazole (NEXIUM) 20 MG capsule, Take 1 capsule (20 mg total) by mouth daily at 12 noon., Disp: 90 capsule, Rfl: 3   ferrous sulfate 325 (  65 FE) MG tablet, Take 1 tablet (325 mg total) by mouth daily with breakfast., Disp: 30 tablet, Rfl: 3   fluticasone (CUTIVATE) 0.05 % cream, Apply 1 Application topically daily., Disp: , Rfl:    furosemide (LASIX) 20 MG tablet, Take 20 mg by mouth. bid, Disp: , Rfl:    glucose blood (TRUE METRIX BLOOD GLUCOSE TEST) test strip, Check BS daily Dx E11.9, Disp: 100 each, Rfl: 3   lenalidomide (REVLIMID) 15 MG capsule, TAKE 1 CAPSULE BY MOUTH EVERY DAY FOR 21 DAYS ON THEN 7 DAYS OFF, Disp: 21 capsule, Rfl: 0   lidocaine-prilocaine (EMLA) cream, Apply 1 Application topically as needed. Apply a small amount to port a cath site and cover with plastic wrap 1 hour prior to infusion appointments, Disp: 30 g, Rfl: 3   lovastatin (MEVACOR) 20 MG tablet, Take 1 tablet (20 mg total) by mouth at  bedtime., Disp: 90 tablet, Rfl: 3   omeprazole (PRILOSEC) 20 MG capsule, Take 20 mg by mouth 2 (two) times daily., Disp: , Rfl:    oxyCODONE-acetaminophen (PERCOCET) 10-325 MG tablet, Take 1 tablet by mouth every 6 (six) hours as needed for pain., Disp: , Rfl:    OXYGEN, Inhale 3 L into the lungs continuous., Disp: , Rfl:    OZEMPIC, 0.25 OR 0.5 MG/DOSE, 2 MG/3ML SOPN, Inject into the skin., Disp: , Rfl:    potassium chloride SA (KLOR-CON) 20 MEQ tablet, Take 2 tablets (40 mEq total) by mouth 2 (two) times daily. (Patient taking differently: Take 40 mEq by mouth daily.), Disp: 360 tablet, Rfl: 3   prochlorperazine (COMPAZINE) 10 MG tablet, Take 1 tablet (10 mg total) by mouth every 6 (six) hours as needed for nausea or vomiting., Disp: 30 tablet, Rfl: 6   sulfamethoxazole-trimethoprim (BACTRIM DS) 800-160 MG tablet, Take 1 tablet by mouth 2 (two) times daily for 10 days., Disp: 20 tablet, Rfl: 0   torsemide (DEMADEX) 20 MG tablet, Take 3 tablets (60 mg total) by mouth 2 (two) times daily. (Patient taking differently: Take 50 mg by mouth 2 (two) times daily.), Disp: 180 tablet, Rfl: 11   TRUEplus Lancets 33G MISC, Check BS daily Dx E11.9, Disp: 100 each, Rfl: 3 No current facility-administered medications for this visit.  Facility-Administered Medications Ordered in Other Visits:    sodium chloride flush (NS) 0.9 % injection 10 mL, 10 mL, Intravenous, PRN, Pennington, Rebekah M, PA-C, 10 mL at 03/08/22 1344 Social History   Socioeconomic History   Marital status: Widowed    Spouse name: Not on file   Number of children: 2   Years of education: 14   Highest education level: Not on file  Occupational History   Occupation: Retired   Tobacco Use   Smoking status: Former    Packs/day: 1.50    Years: 40.00    Total pack years: 60.00    Types: Cigarettes    Quit date: 04/29/2015    Years since quitting: 7.1   Smokeless tobacco: Never   Tobacco comments:    Quit smoking 04/2015- Previous 1.5  ppd smoker  Vaping Use   Vaping Use: Never used  Substance and Sexual Activity   Alcohol use: No    Alcohol/week: 0.0 standard drinks of alcohol   Drug use: No   Sexual activity: Not Currently    Birth control/protection: Post-menopausal  Other Topics Concern   Not on file  Social History Narrative   Her 2 year old granddaughter lives with her - one daughter lives nearby,  but she doesn't have a good relationship with her. Has a great relationship with other daughter who lives 1.5 hrs away - talks to her daily on the phone.   Social Determinants of Health   Financial Resource Strain: Medium Risk (07/27/2021)   Overall Financial Resource Strain (CARDIA)    Difficulty of Paying Living Expenses: Somewhat hard  Food Insecurity: Food Insecurity Present (07/27/2021)   Hunger Vital Sign    Worried About Running Out of Food in the Last Year: Sometimes true    Ran Out of Food in the Last Year: Never true  Transportation Needs: No Transportation Needs (07/27/2021)   PRAPARE - Hydrologist (Medical): No    Lack of Transportation (Non-Medical): No  Physical Activity: Inactive (07/27/2021)   Exercise Vital Sign    Days of Exercise per Week: 0 days    Minutes of Exercise per Session: 0 min  Stress: Stress Concern Present (07/27/2021)   [place]    Feeling of Stress : To some extent  Social Connections: Socially Isolated (07/27/2021)   Social Connection and Isolation Panel [NHANES]    Frequency of Communication with Friends and Family: More than three times a week    Frequency of Social Gatherings with Friends and Family: Once a week    Attends Religious Services: Never    Marine scientist or Organizations: No    Attends Archivist Meetings: Never    Marital Status: Widowed  Intimate Partner Violence: Not At Risk (07/27/2021)   Humiliation, Afraid, Rape, and Kick questionnaire    Fear of  Current or Ex-Partner: No    Emotionally Abused: No    Physically Abused: No    Sexually Abused: No   Family History  Problem Relation Age of Onset   Diabetes Father    Heart disease Father 47       MI   Hypertension Father    Anemia Mother        Transfusion dependent   COPD Sister    Cancer Paternal Grandmother 28       Pancreatic    Objective: Office vital signs reviewed. BP 139/61   Pulse 82   Temp 98.6 F (37 C)   Ht '5\' 2"'$  (1.575 m)   Wt 293 lb (132.9 kg)   SpO2 96%   BMI 53.59 kg/m   Physical Examination:  General: Awake, alert, morbidly obese, No acute distress Extremities: marked lower extremity edema bilaterally.  She has active clear weeping of the left lower extremity with a small abrasion.  This abrasion does have significant surrounding erythema and some mild warmth.  Right lower extremity with half dollar sized wound along the anterior lateral aspect of the leg that has what appears to be a black eschar formation.  No active purulence or significant surrounding erythema  Assessment/ Plan: 72 y.o. female   Wound of right lower extremity, sequela - Plan: sulfamethoxazole-trimethoprim (BACTRIM DS) 800-160 MG tablet, AMB referral to wound care center  Lymphedema - Plan: Ambulatory referral to Physical Therapy  Poorly healing wound on the right lower extremity that likely needs debridement.  I am referring her stat to wound care for this.  I am going to extend her Septra since this has been helpful.  Wound was treated with Xeroform and wrapped with an Ace bandage today.  Xeroform bandages and additional wound care supplies given to the patient's granddaughter  She suffers from significant lymphedema.  I again reinforced need for low-salt diet.  I have placed referral to the lymphedema clinic in [place] in hopes that she may qualify for lymphatic pumps.  No orders of the defined types were placed in this encounter.  No orders of the defined types were placed  in this encounter.    [name], DO [facility] [phone]

## 2022-06-08 NOTE — Patient Instructions (Signed)
Lymphedema ? ?Lymphedema is swelling that is caused by the abnormal collection of lymph in the tissues under the skin. Lymph is excess fluid from the tissues in your body that is removed through the lymphatic system. This system is part of your body's defense system (immune system) and includes lymph nodes and lymph vessels. The lymph vessels collect and carry the excess fluid, fats, proteins, and waste from the tissues of the body to the bloodstream. This system also works to clean and remove bacteria and waste products from the body. ?Lymphedema occurs when the lymphatic system is blocked. When the lymph vessels or lymph nodes are blocked or damaged, lymph does not drain properly. This causes an abnormal buildup of lymph, which leads to swelling in the affected area. This may include the trunk area, or an arm or leg. Lymphedema cannot be cured by medicines, but various methods can be used to help reduce the swelling. ?What are the causes? ?The cause of this condition depends on the type of lymphedema that you have. ?Primary lymphedema is caused by the absence of lymph vessels or having abnormal lymph vessels at birth. ?Secondary lymphedema occurs when lymph vessels are blocked or damaged. Secondary lymphedema is more common. Common causes of lymph vessel blockage include: ?Skin infection, such as cellulitis. ?Infection by parasites (filariasis). ?Injury. ?Radiation therapy. ?Cancer. ?Formation of scar tissue. ?Surgery. ?What are the signs or symptoms? ?Symptoms of this condition include: ?Swelling of the arm or leg. ?A heavy or tight feeling in the arm or leg. ?Swelling of the feet, toes, or fingers. Shoes or rings may fit more tightly than before. ?Redness of the skin over the affected area. ?Limited movement of the affected limb. ?Sensitivity to touch or discomfort in the affected limb. ?How is this diagnosed? ?This condition may be diagnosed based on: ?Your symptoms and medical history. ?A physical  exam. ?Bioimpedance spectroscopy. In this test, painless electrical currents are used to measure fluid levels in your body. ?Imaging tests, such as: ?MRI. ?CT scan. ?Duplex ultrasound. This test uses sound waves to produce images of the vessels and the blood flow on a screen. ?Lymphoscintigraphy. In this test, a low dose of a radioactive substance is injected to trace the flow of lymph through your lymph vessels. ?Lymphangiography. In this test, a contrast dye is injected into the lymph vessel to help show blockages. ?How is this treated? ? ?If an underlying condition is causing the lymphedema, that condition will be treated. For example, antibiotic medicines may be used to treat an infection. ?Treatment for this condition will depend on the cause of your lymphedema. Treatment may include: ?Complete decongestive therapy (CDT). This is done by a certified lymphedema therapist to reduce fluid congestion. This therapy includes: ?Skin care. ?Compression wrapping of the affected area. ?Manual lymph drainage. This is a special massage technique that promotes lymph drainage out of a limb. ?Specific exercises. Certain exercises can help fluid move out of the affected limb. ?Compression. Various methods may be used to apply pressure to the affected limb to reduce the swelling. They include: ?Wearing compression stockings or sleeves on the affected limb. ?Wrapping the affected limb with special bandages. ?Surgery. This is usually done for severe cases only. For example, surgery may be done if you have trouble moving the limb or if the swelling does not get better with other treatments. ?Follow these instructions at home: ?Self-care ?The affected area is more likely to become injured or infected. Take these steps to help prevent infection: ?Keep the affected   area clean and dry. ?Use approved creams or lotions to keep the skin moisturized. ?Protect your skin from cuts: ?Use gloves while cooking or gardening. ?Do not walk  barefoot. ?If you shave the affected area, use an electric razor. ?Do not wear tight clothes, shoes, or jewelry. ?Eat a healthy diet that includes a lot of fruits and vegetables. ?Activity ?Do exercises as told by your health care provider. ?Do not sit with your legs crossed. ?When possible, keep the affected limb raised (elevated) above the level of your heart. ?Avoid carrying things with an arm that is affected by lymphedema. ?General instructions ?Wear compression stockings or sleeves as told by your health care provider. ?Note any changes in size of the affected limb. You may be instructed to take regular measurements and keep track of them. ?Take over-the-counter and prescription medicines only as told by your health care provider. ?If you were prescribed an antibiotic medicine, take or apply it as told by your health care provider. Do not stop using the antibiotic even if you start to feel better or if your condition improves. ?Do not use heating pads or ice packs on the affected area. ?Avoid having blood draws, IV insertions, or blood pressure checks on the affected limb. ?Keep all follow-up visits. This is important. ?Contact a health care provider if you: ?Continue to have swelling in your limb. ?Have fluid leaking from the skin of your swollen limb. ?Have a cut that does not heal. ?Have redness or pain in the affected area. ?Develop purplish spots, rash, blisters, or sores (lesions) on your affected limb. ?Get help right away if you: ?Have new swelling in your limb that starts suddenly. ?Have shortness of breath or chest pain. ?Have a fever or chills. ?These symptoms may represent a serious problem that is an emergency. Do not wait to see if the symptoms will go away. Get medical help right away. Call your local emergency services (911 in the [place] Do not drive yourself to the hospital. ?Summary ?Lymphedema is swelling that is caused by the abnormal collection of lymph in the tissues under the  skin. ?Lymph is fluid from the tissues in your body that is removed through the lymphatic system. This system collects and carries excess fluid, fats, proteins, and wastes from the tissues of the body to the bloodstream. ?Lymphedema causes swelling, pain, and redness in the affected area. This may include the trunk area, or an arm or leg. ?Treatment for this condition may depend on the cause of your lymphedema. Treatment may include treating the underlying cause, complete decongestive therapy (CDT), compression methods, or surgery. ?This information is not intended to replace advice given to you by your health care provider. Make sure you discuss any questions you have with your health care provider. ?Document Revised: 01/08/2020 Document Reviewed: 01/08/2020 ?Elsevier Patient Education ? 2023 [employer] ? ?

## 2022-06-08 NOTE — Addendum Note (Signed)
Addended by: [name] on: 06/08/2022 03:18 PM   Modules accepted: Orders

## 2022-06-08 NOTE — Progress Notes (Signed)
Heart squeeze is >75%, there were no abnormalities noted in the wall motion of the heart, there was some stiffness likely related to high blood pressure, moderate aortic valve stenosis, continue on current medication regimen and keep follow up appointments

## 2022-06-09 LAB — PROTEIN ELECTROPHORESIS, SERUM
A/G Ratio: 1.3 (ref 0.7–1.7)
Albumin ELP: 2.9 g/dL (ref 2.9–4.4)
Alpha-1-Globulin: 0.3 g/dL (ref 0.0–0.4)
Alpha-2-Globulin: 0.6 g/dL (ref 0.4–1.0)
Beta Globulin: 0.8 g/dL (ref 0.7–1.3)
Gamma Globulin: 0.6 g/dL (ref 0.4–1.8)
Globulin, Total: 2.3 g/dL (ref 2.2–3.9)
M-Spike, %: 0.3 g/dL — ABNORMAL HIGH
Total Protein ELP: 5.2 g/dL — ABNORMAL LOW (ref 6.0–8.5)

## 2022-06-13 ENCOUNTER — Encounter: Payer: Self-pay | Admitting: Orthopedic Surgery

## 2022-06-13 ENCOUNTER — Ambulatory Visit (INDEPENDENT_AMBULATORY_CARE_PROVIDER_SITE_OTHER): Payer: Medicare PPO | Admitting: Orthopedic Surgery

## 2022-06-13 DIAGNOSIS — I87331 Chronic venous hypertension (idiopathic) with ulcer and inflammation of right lower extremity: Secondary | ICD-10-CM | POA: Diagnosis not present

## 2022-06-13 DIAGNOSIS — I890 Lymphedema, not elsewhere classified: Secondary | ICD-10-CM

## 2022-06-13 NOTE — Progress Notes (Signed)
Office Visit Note   Patient: Savannah Burns           Date of Birth: [birth year]           MRN: [id number] Visit Date: 06/13/2022              Requested by: [name], DO [place],  [place] [id number] PCP: [name], DO  Chief Complaint  Patient presents with   Right Leg - Injury    DOI 05/23/2022 laceration       HPI: Patient is a 72 year old woman who was seen for initial evaluation for traumatic venous ulcer right lower extremity.  Date of injury February 26.  Patient states she went to the emergency department and had the wound sutured.  She has been on Septra DS.  Patient states that she has been referred to a lymphedema clinic in [place] but has not heard from anyone yet.  She is currently on Demadex and Lasix.  Patient has an A1c of 6.8.  She states she does not check her sugars.  Patient also has a history of currently undergoing chemotherapy for multiple myeloma.  She is on 3 L of oxygen per nasal cannula.  Assessment & Plan: Visit Diagnoses:  1. Chronic venous hypertension (idiopathic) with ulcer and inflammation of right lower extremity (HCC)   2. Lymphedema     Plan: Will apply a 3 layer compression wrap to the right leg.  Will evaluate for lymphedema pumps and determine if the patient could proceed with Kerecis tissue graft.  Will evaluate for insurance authorization.  If authorized she could be placed in the study.  Follow-Up Instructions: Return in about 1 week (around 06/20/2022).   Ortho Exam  Patient is alert, oriented, no adenopathy, well-dressed, normal affect, normal respiratory effort. Examination patient has lymphedema and venous insufficiency both lower extremities with brawny edema there is a lateral calf ulcer on the right which is 35 x 15 mm over the anterior lateral calf.  Her calf measures 55 cm in circumference.  There are no open ulcers on the left calf.  I cannot palpate a pulse secondary to swelling.  Imaging: No  results found.   Labs: Lab Results  Component Value Date   HGBA1C 6.5 (H) 02/25/2022   HGBA1C 5.7 (H) 12/14/2021   HGBA1C 5.2 12/09/2020   ESRSEDRATE 68 (H) 09/21/2016   CRP 11.9 (H) 09/21/2016   LABURIC 7.9 (H) 09/21/2016   LABORGA ESCHERICHIA COLI 04/30/2015     Lab Results  Component Value Date   ALBUMIN 3.1 (L) 06/07/2022   ALBUMIN 3.2 (L) 05/10/2022   ALBUMIN 3.2 (L) 04/05/2022    Lab Results  Component Value Date   MG 1.8 06/07/2022   MG 1.9 05/10/2022   MG 1.8 04/05/2022   Lab Results  Component Value Date   VD25OH 34.67 08/13/2021   VD25OH 36.63 07/09/2020   VD25OH 28.4 (L) 09/21/2016    No results found for: "PREALBUMIN"    Latest Ref Rng & Units 06/07/2022    8:08 AM 05/24/2022    8:14 AM 05/10/2022    8:19 AM  CBC EXTENDED  WBC 4.0 - 10.5 K/uL 3.4  3.0  2.6   RBC 3.87 - 5.11 MIL/uL 2.92  2.75  3.03   Hemoglobin 12.0 - 15.0 g/dL 8.6  8.2  8.9   HCT 36.0 - 46.0 % 28.4  27.0  29.7   Platelets 150 - 400 K/uL 101  107  106  NEUT# 1.7 - 7.7 K/uL 1.7  1.4  1.3   Lymph# 0.7 - 4.0 K/uL 1.1  1.0  0.8      There is no height or weight on file to calculate BMI.  Orders:  No orders of the defined types were placed in this encounter.  No orders of the defined types were placed in this encounter.    Procedures: No procedures performed  Clinical Data: No additional findings.  ROS:  All other systems negative, except as noted in the HPI. Review of Systems  Objective: Vital Signs: There were no vitals taken for this visit.  Specialty Comments:  No specialty comments available.  PMFS History: Patient Active Problem List   Diagnosis Date Noted   Diabetic peripheral neuropathy associated with type 2 diabetes mellitus ([place]) 02/25/2022   Multiple myeloma ([place]) 11/30/2021   H. pylori infection 08/12/2021   Cirrhosis of liver ([place]) 08/04/2021   Hypoalbuminemia 05/17/2020   Moderate protein malnutrition ([place]) 05/17/2020   Melena 05/16/2020    Hyperlipidemia    Hypokalemia    GI bleed 03/20/2020   Lower GI bleed 03/19/2020   Acute on chronic congestive heart failure (HCC)    Symptomatic anemia    Iron deficiency anemia    Heme positive stool    ABLA (acute blood loss anemia) 01/22/2020   Supplemental oxygen dependent 10/22/2019   Chronic dyspnea 10/22/2019   Hypertension associated with diabetes ([place]) 10/22/2019   Bilateral hand pain 09/21/2016   Fatigue 07/30/2015   Chronic diastolic HF (heart failure) ([place]) 06/29/2015   Postmenopausal bleeding 04/17/2015   Excessive daytime sleepiness 12/19/2014   Swelling of lower extremity 11/27/2014   Back pain 06/13/2013   Sinusitis, chronic 05/30/2012   Allergic rhinitis 06/06/2011   Hyperlipidemia associated with type 2 diabetes mellitus ([place]) 03/24/2009   Diabetes mellitus, type 2 ([place]) 08/12/2008   Pulmonary nodule 08/29/2007   COPD (chronic obstructive pulmonary disease) ([place]) 02/14/2007   Class 3 obesity ([place]) 05/25/2006   Depression, recurrent ([place]) 05/25/2006   HYPERTENSION, BENIGN SYSTEMIC 05/25/2006   DJD, UNSPECIFIED 05/25/2006   Past Medical History:  Diagnosis Date   Adrenal adenoma, left    Stable   Anxiety    Arthritis    bilateral hands   Depression    Diabetes mellitus, type 2 ([place]) 08/12/2008   Qualifier: Diagnosis of  By: Deborra Medina MD, Talia     Dyspnea    Esophageal varices ([place])    Grade II diastolic dysfunction    History of kidney stones    Hyperlipidemia    Hypertension    Lower back pain    Lower GI bleed 03/19/2020   Panic attacks    Pneumonia    currently taking antibiotic and prednisone for early stages of pneumonia   Pulmonary nodules    bilateral   Skin cancer    face    Family History  Problem Relation Age of Onset   Diabetes Father    Heart disease Father 17       MI   Hypertension Father    Anemia Mother        Transfusion dependent   COPD Sister    Cancer Paternal Grandmother 85       Pancreatic    Past Surgical History:   Procedure Laterality Date   BIOPSY  04/07/2020   Procedure: BIOPSY;  Surgeon: Eloise Harman, DO;  [facility];  Service: Endoscopy;;   Breast Cystectomy  Right    CESAREAN SECTION  COLONOSCOPY WITH PROPOFOL N/A 01/25/2020   Dr. Abbey Chatters: Nonbleeding internal hemorrhoids, diverticulosis, 5 mm polyp removed from the ascending colon, 10 mm polyp removed from the sigmoid colon, 30 mm polyp (tubulovillous adenoma with no high-grade dysplasia) removed from the transverse colon via piecemeal status post tattoo.  Other polyps were tubular adenomas.  3 month surveillance colonoscopy recommended.   COLONOSCOPY WITH PROPOFOL N/A 04/07/2020   Procedure: COLONOSCOPY WITH PROPOFOL;  Surgeon: Eloise Harman, DO;  [facility];  Service: Endoscopy;  Laterality: N/A;  3:00pm, pt knows new time per office   CYSTOSCOPY/URETEROSCOPY/HOLMIUM LASER/STENT PLACEMENT Bilateral 03/01/2019   Procedure: CYSTOSCOPY/RETROGRADEURETEROSCOPY/HOLMIUM LASER/STENT PLACEMENT;  Surgeon: Ceasar Mons, MD;  Location: [facility];  Service: Urology;  Laterality: Bilateral;  ONLY NEEDS 60 MIN   ESOPHAGOGASTRODUODENOSCOPY (EGD) WITH PROPOFOL N/A 01/25/2020   Dr. Abbey Chatters: 4 columns grade 1 esophageal varices   ESOPHAGOGASTRODUODENOSCOPY (EGD) WITH PROPOFOL N/A 05/18/2020   Procedure: ESOPHAGOGASTRODUODENOSCOPY (EGD) WITH PROPOFOL;  Surgeon: Eloise Harman, DO;  [facility];  Service: Endoscopy;  Laterality: N/A;   ESOPHAGOGASTRODUODENOSCOPY (EGD) WITH PROPOFOL N/A 07/28/2020   Procedure: ESOPHAGOGASTRODUODENOSCOPY (EGD) WITH PROPOFOL;  Surgeon: Eloise Harman, DO;  [facility];  Service: Endoscopy;  Laterality: N/A;  am or early PM due to givens capsule placement   GIVENS CAPSULE STUDY N/A 05/18/2020   Procedure: [place];  Surgeon: Harvel Quale, MD;  [facility];  Service: Gastroenterology;  Laterality: N/A;   GIVENS CAPSULE STUDY N/A  07/28/2020   Procedure: GIVENS CAPSULE STUDY;  Surgeon: Eloise Harman, DO;  [facility];  Service: Endoscopy;  Laterality: N/A;   IR IMAGING GUIDED PORT INSERTION  12/02/2021   POLYPECTOMY  01/25/2020   Procedure: POLYPECTOMY;  Surgeon: Eloise Harman, DO;  [facility];  Service: Endoscopy;;   POLYPECTOMY  04/07/2020   Procedure: POLYPECTOMY INTESTINAL;  Surgeon: Eloise Harman, DO;  [facility];  Service: Endoscopy;;   SKIN CANCER EXCISION     Face   SPINE SURGERY     SUBMUCOSAL TATTOO INJECTION  01/25/2020   Procedure: SUBMUCOSAL TATTOO INJECTION;  Surgeon: Eloise Harman, DO;  [facility];  Service: Endoscopy;;   Social History   Occupational History   Occupation: Retired   Tobacco Use   Smoking status: Former    Packs/day: 1.50    Years: 40.00    Additional pack years: 0.00    Total pack years: 60.00    Types: Cigarettes    Quit date: 04/29/2015    Years since quitting: 7.1   Smokeless tobacco: Never   Tobacco comments:    Quit smoking 04/2015- Previous 1.5 ppd smoker  Vaping Use   Vaping Use: Never used  Substance and Sexual Activity   Alcohol use: No    Alcohol/week: 0.0 standard drinks of alcohol   Drug use: No   Sexual activity: Not Currently    Birth control/protection: Post-menopausal

## 2022-06-16 ENCOUNTER — Ambulatory Visit: Payer: Medicare PPO

## 2022-06-20 ENCOUNTER — Ambulatory Visit: Payer: Medicare PPO | Admitting: Orthopedic Surgery

## 2022-06-20 DIAGNOSIS — I890 Lymphedema, not elsewhere classified: Secondary | ICD-10-CM | POA: Diagnosis not present

## 2022-06-20 DIAGNOSIS — I87331 Chronic venous hypertension (idiopathic) with ulcer and inflammation of right lower extremity: Secondary | ICD-10-CM | POA: Diagnosis not present

## 2022-06-21 ENCOUNTER — Inpatient Hospital Stay (HOSPITAL_BASED_OUTPATIENT_CLINIC_OR_DEPARTMENT_OTHER): Payer: Medicare PPO | Admitting: Hematology

## 2022-06-21 ENCOUNTER — Inpatient Hospital Stay: Payer: Medicare PPO

## 2022-06-21 VITALS — BP 140/70 | HR 80 | Temp 99.0°F | Resp 20 | Wt 295.2 lb

## 2022-06-21 DIAGNOSIS — D509 Iron deficiency anemia, unspecified: Secondary | ICD-10-CM | POA: Diagnosis not present

## 2022-06-21 DIAGNOSIS — E119 Type 2 diabetes mellitus without complications: Secondary | ICD-10-CM | POA: Diagnosis not present

## 2022-06-21 DIAGNOSIS — Z87442 Personal history of urinary calculi: Secondary | ICD-10-CM | POA: Diagnosis not present

## 2022-06-21 DIAGNOSIS — C9000 Multiple myeloma not having achieved remission: Secondary | ICD-10-CM | POA: Diagnosis not present

## 2022-06-21 DIAGNOSIS — E785 Hyperlipidemia, unspecified: Secondary | ICD-10-CM | POA: Diagnosis not present

## 2022-06-21 DIAGNOSIS — Z5112 Encounter for antineoplastic immunotherapy: Secondary | ICD-10-CM | POA: Diagnosis not present

## 2022-06-21 DIAGNOSIS — D3502 Benign neoplasm of left adrenal gland: Secondary | ICD-10-CM | POA: Diagnosis not present

## 2022-06-21 DIAGNOSIS — K7460 Unspecified cirrhosis of liver: Secondary | ICD-10-CM | POA: Diagnosis not present

## 2022-06-21 DIAGNOSIS — D500 Iron deficiency anemia secondary to blood loss (chronic): Secondary | ICD-10-CM

## 2022-06-21 MED ORDER — SODIUM CHLORIDE 0.9 % IV SOLN: Freq: Once | INTRAVENOUS | Status: AC

## 2022-06-21 MED ORDER — SODIUM CHLORIDE 0.9% FLUSH
10.0000 mL | INTRAVENOUS | Status: DC | PRN
  Administered 2022-06-21: 10 mL via INTRAVENOUS

## 2022-06-21 MED ORDER — SODIUM CHLORIDE 0.9 % IV SOLN
500.0000 mg | Freq: Once | INTRAVENOUS | Status: AC
  Administered 2022-06-21: 500 mg via INTRAVENOUS
  Filled 2022-06-21: qty 25

## 2022-06-21 MED ORDER — LIDOCAINE-PRILOCAINE 2.5-2.5 % EX CREA: 1.0000 | TOPICAL_CREAM | CUTANEOUS | 3 refills | Status: DC | PRN

## 2022-06-21 MED ORDER — HEPARIN SOD (PORK) LOCK FLUSH 100 UNIT/ML IV SOLN
500.0000 [IU] | Freq: Once | INTRAVENOUS | Status: AC
  Administered 2022-06-21: 500 [IU] via INTRAVENOUS

## 2022-06-21 NOTE — Patient Instructions (Signed)
[facility]  Discharge Instructions: Thank you for choosing [facility] to provide your oncology and hematology care.  If you have a lab appointment with the Cancer Center, please come in thru the Main Entrance and check in at the main information desk.  Wear comfortable clothing and clothing appropriate for easy access to any Portacath or PICC line.   We strive to give you quality time with your provider. You may need to reschedule your appointment if you arrive late (15 or more minutes).  Arriving late affects you and other patients whose appointments are after yours.  Also, if you miss three or more appointments without notifying the office, you may be dismissed from the clinic at the provider's discretion.      For prescription refill requests, have your pharmacy contact our office and allow 72 hours for refills to be completed.    Today you received Venofer IV iron infusion.     BELOW ARE SYMPTOMS THAT SHOULD BE REPORTED IMMEDIATELY: *FEVER GREATER THAN 100.4 F (38 C) OR HIGHER *CHILLS OR SWEATING *NAUSEA AND VOMITING THAT IS NOT CONTROLLED WITH YOUR NAUSEA MEDICATION *UNUSUAL SHORTNESS OF BREATH *UNUSUAL BRUISING OR BLEEDING *URINARY PROBLEMS (pain or burning when urinating, or frequent urination) *BOWEL PROBLEMS (unusual diarrhea, constipation, pain near the anus) TENDERNESS IN MOUTH AND THROAT WITH OR WITHOUT PRESENCE OF ULCERS (sore throat, sores in mouth, or a toothache) UNUSUAL RASH, SWELLING OR PAIN  UNUSUAL VAGINAL DISCHARGE OR ITCHING   Items with * indicate a potential emergency and should be followed up as soon as possible or go to the Emergency Department if any problems should occur.  Please show the CHEMOTHERAPY ALERT CARD or IMMUNOTHERAPY ALERT CARD at check-in to the Emergency Department and triage nurse.  Should you have questions after your visit or need to cancel or reschedule your appointment, please contact [facility] [phone]  and follow the prompts.  Office hours are 8:00 a.m. to 4:30 p.m. Monday - Friday. Please note that voicemails left after 4:00 p.m. may not be returned until the following business day.  We are closed weekends and major holidays. You have access to a nurse at all times for urgent questions. Please call the main number to the clinic [phone] and follow the prompts.  For any non-urgent questions, you may also contact your provider using MyChart. We now offer e-Visits for anyone 18 and older to request care online for non-urgent symptoms. For details visit [email or web address].   Also download the MyChart app! Go to the app store, search "MyChart", open the app, select [facility], and log in with your MyChart username and password.   

## 2022-06-21 NOTE — Progress Notes (Signed)
[facility] [address], [place] [address]    Clinic Day:  06/21/2022  Referring physician: [name], DO  Patient Care Team: [name], DO as PCP - General (Family Medicine) Harl Bowie Alphonse Guild, MD as PCP - Cardiology (Cardiology) Zonia Kief, MD (Rehabilitation) Chesley Mires, MD as Consulting Physician (Pulmonary Disease) Eloise Harman, DO as Consulting Physician (Internal Medicine) Jeanella Anton, NP as Nurse Practitioner (Nurse Practitioner) Roselyn Reef, MD as Referring Physician (Gastroenterology) Derek Jack, MD as Medical Oncologist (Medical Oncology) Brien Mates, RN as Oncology Nurse Navigator (Medical Oncology)   ASSESSMENT & PLAN:   Assessment: 1.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  Orderly maturation of erythroid and myeloid series. - Myeloma FISH panel: t(11;14) - Cytogenetics: Failed to grow metaphases - PET scan (11/18/2021): No evidence of active myeloma or plasmacytoma.  Right upper lobe lung infection. - Skull x-rays (10/29/2021) multiple discrete round lucent/lytic lesions in the skull - Worsening M spike and free light chain ratio.  Normal creatinine and calcium.  Multifactorial anemia including blood loss and bone marrow infiltration. - Cycle 1 of DRd started on 12/14/2021 with lenalidomide 15 mg 3 weeks on/1 week off and dexamethasone 20 mg weekly   2.  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  Quit smoking 15 years ago.  No exposure to chemicals or pesticides. - Paternal grandmother had ovarian cancer.  Sister had ovarian cancer.   3.  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  Seen by transplant hepatology service at [facility].  Plan:  1.  Stage II IgG kappa myeloma, standard risk: - Reviewed myeloma  labs from 06/07/2022.  M spike is 0.3 g and stable.  Free kappa light chain slightly increased 24 and ratio of 3.01. - Continue Revlimid 15 mg 3 weeks on/1 week off.  Continue Darzalex every 4 weeks with dexamethasone 20 mg weekly.  She missed 2 weeks of dexamethasone. - Proceed with cycle 8 on 07/05/2022.  RTC 6 weeks for follow-up.   2.  Iron deficiency anemia from chronic GI bleed: - Her ferritin is down to 51 from 66 in January.  She had received 5 Venofer infusions since then.  She reports having occasional dark stools up to 2 times per month.  She is continuing to have GI blood loss. - Will increase Venofer to 500 mg every 8 2 weeks as standing order.   3.  Infection prophylaxis: - Continue acyclovir 400 mg twice daily for shingles prophylaxis.  Aspirin on hold due to GI bleed.   4.  Cirrhosis: - Continue Lasix 20 mg daily, carvedilol 6.25 mg daily and lactulose daily.  5.  Peripheral neuropathy: - She feels like walking on tiny pillows.  Hands occasionally go to sleep.  Will closely monitor.  Orders Placed This Encounter  Procedures   CBC with Differential    Standing Status:   Future    Standing Expiration Date:   08/02/2023   CBC with Differential    Standing Status:   Future    Standing Expiration Date:   08/30/2023    I,Alexis Herring,acting as a scribe for Derek Jack, MD.,have documented all relevant documentation on the behalf of Derek Jack, MD,as directed by  Derek Jack, MD while in the presence of Derek Jack, MD.  I, Derek Jack MD, have reviewed  the above documentation for accuracy and completeness, and I agree with the above.    Derek Jack, MD   3/26/20246:37 PM  CHIEF COMPLAINT:   Diagnosis: stage II standard risk IgG kappa plasma cell myeloma    Cancer Staging  Multiple myeloma ([place]) Staging form: Plasma Cell Myeloma and Plasma Cell Disorders, AJCC 8th Edition - Clinical stage from 11/30/2021: RISS Stage II  (Beta-2-microglobulin (mg/L): 2.8, Albumin (g/dL): 3, ISS: Stage II, High-risk cytogenetics: Absent, LDH: Normal) - Signed by Derek Jack, MD on 11/30/2021    Prior Therapy: None  Current Therapy:   Daratumumab, lenalidomide and dexamethasone   HISTORY OF PRESENT ILLNESS:   Oncology History  Multiple myeloma ([place])  11/30/2021 Initial Diagnosis   Multiple myeloma ([place])   11/30/2021 Cancer Staging   Staging form: Plasma Cell Myeloma and Plasma Cell Disorders, AJCC 8th Edition - Clinical stage from 11/30/2021: RISS Stage II (Beta-2-microglobulin (mg/L): 2.8, Albumin (g/dL): 3, ISS: Stage II, High-risk cytogenetics: Absent, LDH: Normal) - Signed by Derek Jack, MD on 11/30/2021 Histopathologic type: Multiple myeloma Stage prefix: Initial diagnosis Beta 2 microglobulin range (mg/L): Less than 3.5 Albumin range (g/dL): Less than 3.5 Cytogenetics: t(11;14) translocation Serum calcium level: Normal   12/14/2021 -  Chemotherapy   Patient is on Treatment Plan : MYELOMA  Daratumumab SQ + Lenalidomide + Dexamethasone (DaraRd) q28d        INTERVAL HISTORY:   [name] is a 72 y.o. female presenting to clinic today for follow up of stage II standard risk IgG kappa plasma cell myeloma. She was last seen by me on 05/10/22.  She was seen in the ED on 05/23/22 for right lower leg laceration after hitting her leg on the corner of her car door. She had a lac repair with 5 sutures and was given an Rx for bacitracin ointment at that time.  Today, she states that she is doing well overall. Her appetite level is at 100%. Her energy level is at 75%. She states that her energy has improved. She denies any hematuria or blood in her stools. She has occasional dark stools, maybe once a month. She denies any pain. She is tolerating Revlimid. She is taking the Decadron once a week as prescribed but missed a dose or two due to a pharmacy issue. She states that she was advised to start OTC alpha-lipoic  acid due to a change in sensation in her feet- she describes this as if she is "walking on pillows."  PAST MEDICAL HISTORY:   Past Medical History: Past Medical History:  Diagnosis Date   Adrenal adenoma, left    Stable   Anxiety    Arthritis    bilateral hands   Depression    Diabetes mellitus, type 2 ([place]) 08/12/2008   Qualifier: Diagnosis of  By: Deborra Medina MD, Talia     Dyspnea    Esophageal varices (HCC)    Grade II diastolic dysfunction    History of kidney stones    Hyperlipidemia    Hypertension    Lower back pain    Lower GI bleed 03/19/2020   Panic attacks    Pneumonia    currently taking antibiotic and prednisone for early stages of pneumonia   Pulmonary nodules    bilateral   Skin cancer    face    Surgical History: Past Surgical History:  Procedure Laterality Date   BIOPSY  04/07/2020   Procedure: BIOPSY;  Surgeon: Eloise Harman, DO;  [facility];  Service: Endoscopy;;  Breast Cystectomy  Right    CESAREAN SECTION     COLONOSCOPY WITH PROPOFOL N/A 01/25/2020   Dr. Abbey Chatters: Nonbleeding internal hemorrhoids, diverticulosis, 5 mm polyp removed from the ascending colon, 10 mm polyp removed from the sigmoid colon, 30 mm polyp (tubulovillous adenoma with no high-grade dysplasia) removed from the transverse colon via piecemeal status post tattoo.  Other polyps were tubular adenomas.  3 month surveillance colonoscopy recommended.   COLONOSCOPY WITH PROPOFOL N/A 04/07/2020   Procedure: COLONOSCOPY WITH PROPOFOL;  Surgeon: Eloise Harman, DO;  [facility];  Service: Endoscopy;  Laterality: N/A;  3:00pm, pt knows new time per office   CYSTOSCOPY/URETEROSCOPY/HOLMIUM LASER/STENT PLACEMENT Bilateral 03/01/2019   Procedure: CYSTOSCOPY/RETROGRADEURETEROSCOPY/HOLMIUM LASER/STENT PLACEMENT;  Surgeon: Ceasar Mons, MD;  Location: [facility];  Service: Urology;  Laterality: Bilateral;  ONLY NEEDS 60 MIN   ESOPHAGOGASTRODUODENOSCOPY (EGD) WITH  PROPOFOL N/A 01/25/2020   Dr. Abbey Chatters: 4 columns grade 1 esophageal varices   ESOPHAGOGASTRODUODENOSCOPY (EGD) WITH PROPOFOL N/A 05/18/2020   Procedure: ESOPHAGOGASTRODUODENOSCOPY (EGD) WITH PROPOFOL;  Surgeon: Eloise Harman, DO;  [facility];  Service: Endoscopy;  Laterality: N/A;   ESOPHAGOGASTRODUODENOSCOPY (EGD) WITH PROPOFOL N/A 07/28/2020   Procedure: ESOPHAGOGASTRODUODENOSCOPY (EGD) WITH PROPOFOL;  Surgeon: Eloise Harman, DO;  [facility];  Service: Endoscopy;  Laterality: N/A;  am or early PM due to givens capsule placement   GIVENS CAPSULE STUDY N/A 05/18/2020   Procedure: [place];  Surgeon: Harvel Quale, MD;  [facility];  Service: Gastroenterology;  Laterality: N/A;   GIVENS CAPSULE STUDY N/A 07/28/2020   Procedure: GIVENS CAPSULE STUDY;  Surgeon: Eloise Harman, DO;  [facility];  Service: Endoscopy;  Laterality: N/A;   IR IMAGING GUIDED PORT INSERTION  12/02/2021   POLYPECTOMY  01/25/2020   Procedure: POLYPECTOMY;  Surgeon: Eloise Harman, DO;  [facility];  Service: Endoscopy;;   POLYPECTOMY  04/07/2020   Procedure: POLYPECTOMY INTESTINAL;  Surgeon: Eloise Harman, DO;  [facility];  Service: Endoscopy;;   SKIN CANCER EXCISION     Face   SPINE SURGERY     SUBMUCOSAL TATTOO INJECTION  01/25/2020   Procedure: SUBMUCOSAL TATTOO INJECTION;  Surgeon: Eloise Harman, DO;  [facility];  Service: Endoscopy;;    Social History: Social History   Socioeconomic History   Marital status: Widowed    Spouse name: Not on file   Number of children: 2   Years of education: 14   Highest education level: Not on file  Occupational History   Occupation: Retired   Tobacco Use   Smoking status: Former    Packs/day: 1.50    Years: 40.00    Additional pack years: 0.00    Total pack years: 60.00    Types: Cigarettes    Quit date: 04/29/2015    Years since quitting: 7.1    Smokeless tobacco: Never   Tobacco comments:    Quit smoking 04/2015- Previous 1.5 ppd smoker  Vaping Use   Vaping Use: Never used  Substance and Sexual Activity   Alcohol use: No    Alcohol/week: 0.0 standard drinks of alcohol   Drug use: No   Sexual activity: Not Currently    Birth control/protection: Post-menopausal  Other Topics Concern   Not on file  Social History Narrative   Her 60 year old granddaughter lives with her - one daughter lives nearby, but she doesn't have a good relationship with  her. Has a great relationship with other daughter who lives 1.5 hrs away - talks to her daily on the phone.   Social Determinants of Health   Financial Resource Strain: Medium Risk (07/27/2021)   Overall Financial Resource Strain (CARDIA)    Difficulty of Paying Living Expenses: Somewhat hard  Food Insecurity: Food Insecurity Present (07/27/2021)   Hunger Vital Sign    Worried About Running Out of Food in the Last Year: Sometimes true    Ran Out of Food in the Last Year: Never true  Transportation Needs: No Transportation Needs (07/27/2021)   PRAPARE - Hydrologist (Medical): No    Lack of Transportation (Non-Medical): No  Physical Activity: Inactive (07/27/2021)   Exercise Vital Sign    Days of Exercise per Week: 0 days    Minutes of Exercise per Session: 0 min  Stress: Stress Concern Present (07/27/2021)   [place]    Feeling of Stress : To some extent  Social Connections: Socially Isolated (07/27/2021)   Social Connection and Isolation Panel [NHANES]    Frequency of Communication with Friends and Family: More than three times a week    Frequency of Social Gatherings with Friends and Family: Once a week    Attends Religious Services: Never    Marine scientist or Organizations: No    Attends Archivist Meetings: Never    Marital Status: Widowed  Intimate Partner Violence: Not At  Risk (07/27/2021)   Humiliation, Afraid, Rape, and Kick questionnaire    Fear of Current or Ex-Partner: No    Emotionally Abused: No    Physically Abused: No    Sexually Abused: No    Family History: Family History  Problem Relation Age of Onset   Diabetes Father    Heart disease Father 37       MI   Hypertension Father    Anemia Mother        Transfusion dependent   COPD Sister    Cancer Paternal Grandmother 57       Pancreatic    Current Medications:  Current Outpatient Medications:    acyclovir (ZOVIRAX) 400 MG tablet, Take 1 tablet (400 mg total) by mouth 2 (two) times daily., Disp: 60 tablet, Rfl: 6   Alpha-Lipoic Acid 600 MG CAPS, Take 1 capsule (600 mg total) by mouth daily. For neuropathy, Disp: 100 capsule, Rfl: 3   bacitracin ointment, Apply 1 Application topically 2 (two) times daily., Disp: 120 g, Rfl: 0   baclofen (LIORESAL) 10 MG tablet, Take 10 mg by mouth 2 (two) times daily., Disp: , Rfl:    Blood Glucose Calibration (TRUE METRIX LEVEL 1) Low SOLN, Use w/ glucose monitor Dx E11.9, Disp: 3 each, Rfl: 0   Blood Glucose Monitoring Suppl (TRUE METRIX AIR GLUCOSE METER) w/Device KIT, Check BS daily Dx E11.9, Disp: 1 kit, Rfl: 0   carvedilol (COREG) 6.25 MG tablet, Take 6.25 mg by mouth 2 (two) times daily., Disp: , Rfl:    Cholecalciferol (VITAMIN D) 50 MCG (2000 UT) tablet, Take 2,000 Units by mouth daily., Disp: , Rfl:    clobetasol (TEMOVATE) 0.05 % external solution, Apply topically., Disp: , Rfl:    CONSTULOSE 10 GM/15ML solution, Take by mouth., Disp: , Rfl:    Cyanocobalamin (B-12 COMPLIANCE INJECTION) 1000 MCG/ML KIT, Inject 1,000 mcg as directed every 30 (thirty) days., Disp: , Rfl:    daratumumab-hyaluronidase-fihj (DARZALEX FASPRO) 1800-30000 MG-UT/15ML SOLN,  Inject 1,800 mg into the skin once a week., Disp: , Rfl:    desvenlafaxine (PRISTIQ) 100 MG 24 hr tablet, Take 1 tablet (100 mg total) by mouth daily., Disp: 90 tablet, Rfl: 3   dexamethasone (DECADRON)  4 MG tablet, Take 5 tablets (20 mg total) by mouth once a week., Disp: 20 tablet, Rfl: 3   esomeprazole (NEXIUM) 20 MG capsule, Take 1 capsule (20 mg total) by mouth daily at 12 noon., Disp: 90 capsule, Rfl: 3   ferrous sulfate 325 (65 FE) MG tablet, Take 1 tablet (325 mg total) by mouth daily with breakfast., Disp: 30 tablet, Rfl: 3   fluticasone (CUTIVATE) 0.05 % cream, Apply 1 Application topically daily., Disp: , Rfl:    furosemide (LASIX) 20 MG tablet, Take 20 mg by mouth. bid, Disp: , Rfl:    glucose blood (TRUE METRIX BLOOD GLUCOSE TEST) test strip, Check BS daily Dx E11.9, Disp: 100 each, Rfl: 3   lenalidomide (REVLIMID) 15 MG capsule, TAKE 1 CAPSULE BY MOUTH EVERY DAY FOR 21 DAYS ON THEN 7 DAYS OFF, Disp: 21 capsule, Rfl: 0   lovastatin (MEVACOR) 20 MG tablet, Take 1 tablet (20 mg total) by mouth at bedtime., Disp: 90 tablet, Rfl: 3   omeprazole (PRILOSEC) 20 MG capsule, Take 20 mg by mouth 2 (two) times daily., Disp: , Rfl:    oxyCODONE-acetaminophen (PERCOCET) 10-325 MG tablet, Take 1 tablet by mouth every 6 (six) hours as needed for pain., Disp: , Rfl:    OXYGEN, Inhale 3 L into the lungs continuous., Disp: , Rfl:    OZEMPIC, 0.25 OR 0.5 MG/DOSE, 2 MG/3ML SOPN, Inject into the skin., Disp: , Rfl:    potassium chloride SA (KLOR-CON) 20 MEQ tablet, Take 2 tablets (40 mEq total) by mouth 2 (two) times daily. (Patient taking differently: Take 40 mEq by mouth daily.), Disp: 360 tablet, Rfl: 3   TRUEplus Lancets 33G MISC, Check BS daily Dx E11.9, Disp: 100 each, Rfl: 3   lidocaine-prilocaine (EMLA) cream, Apply 1 Application topically as needed. Apply a small amount to port a cath site and cover with plastic wrap 1 hour prior to infusion appointments, Disp: 30 g, Rfl: 3   prochlorperazine (COMPAZINE) 10 MG tablet, Take 1 tablet (10 mg total) by mouth every 6 (six) hours as needed for nausea or vomiting., Disp: 30 tablet, Rfl: 6   torsemide (DEMADEX) 20 MG tablet, Take 3 tablets (60 mg total) by  mouth 2 (two) times daily. (Patient taking differently: Take 50 mg by mouth 2 (two) times daily.), Disp: 180 tablet, Rfl: 11 No current facility-administered medications for this visit.  Facility-Administered Medications Ordered in Other Visits:    sodium chloride flush (NS) 0.9 % injection 10 mL, 10 mL, Intravenous, PRN, Pennington, Rebekah M, PA-C, 10 mL at 03/08/22 1344   sodium chloride flush (NS) 0.9 % injection 10 mL, 10 mL, Intravenous, PRN, Derek Jack, MD, 10 mL at 06/21/22 1552   Allergies: Allergies  Allergen Reactions   Keflex [Cephalexin] Nausea And Vomiting    REVIEW OF SYSTEMS:   Review of Systems  Constitutional:  Negative for chills, fatigue and fever.  HENT:   Negative for lump/mass, mouth sores, nosebleeds, sore throat and trouble swallowing.   Eyes:  Negative for eye problems.  Respiratory:  Negative for cough and shortness of breath.   Cardiovascular:  Negative for chest pain, leg swelling and palpitations.  Gastrointestinal:  Negative for abdominal pain, constipation, diarrhea, nausea and vomiting.  Genitourinary:  Negative for  bladder incontinence, difficulty urinating, dysuria, frequency, hematuria and nocturia.   Musculoskeletal:  Negative for arthralgias, back pain, flank pain, myalgias and neck pain.  Skin:  Negative for itching and rash.  Neurological:  Positive for numbness (feet). Negative for dizziness and headaches.  Hematological:  Does not bruise/bleed easily.  Psychiatric/Behavioral:  Negative for depression, sleep disturbance and suicidal ideas. The patient is not nervous/anxious.   All other systems reviewed and are negative.    VITALS:   There were no vitals taken for this visit.  Wt Readings from Last 3 Encounters:  06/21/22 295 lb 3.1 oz (133.9 kg)  06/08/22 293 lb (132.9 kg)  06/07/22 294 lb 8.6 oz (133.6 kg)    There is no height or weight on file to calculate BMI.  Performance status (ECOG): 1 - Symptomatic but completely  ambulatory  PHYSICAL EXAM:   Physical Exam Vitals and nursing note reviewed. Exam conducted with a chaperone present.  Constitutional:      Appearance: Normal appearance.  Cardiovascular:     Rate and Rhythm: Normal rate and regular rhythm.     Pulses: Normal pulses.     Heart sounds: Normal heart sounds.  Pulmonary:     Effort: Pulmonary effort is normal.     Breath sounds: Normal breath sounds.  Abdominal:     Palpations: Abdomen is soft. There is no hepatomegaly, splenomegaly or mass.     Tenderness: There is no abdominal tenderness.  Musculoskeletal:     Right lower leg: No edema.     Left lower leg: No edema.  Lymphadenopathy:     Cervical: No cervical adenopathy.     Right cervical: No superficial, deep or posterior cervical adenopathy.    Left cervical: No superficial, deep or posterior cervical adenopathy.     Upper Body:     Right upper body: No supraclavicular or axillary adenopathy.     Left upper body: No supraclavicular or axillary adenopathy.  Neurological:     General: No focal deficit present.     Mental Status: She is alert and oriented to person, place, and time.  Psychiatric:        Mood and Affect: Mood normal.        Behavior: Behavior normal.     LABS:      Latest Ref Rng & Units 06/07/2022    8:08 AM 05/24/2022    8:14 AM 05/10/2022    8:19 AM  CBC  WBC 4.0 - 10.5 K/uL 3.4  3.0  2.6   Hemoglobin 12.0 - 15.0 g/dL 8.6  8.2  8.9   Hematocrit 36.0 - 46.0 % 28.4  27.0  29.7   Platelets 150 - 400 K/uL 101  107  106       Latest Ref Rng & Units 06/07/2022    8:08 AM 05/10/2022    8:19 AM 04/05/2022   12:13 PM  CMP  Glucose 70 - 99 mg/dL 153  167  109   BUN 8 - 23 mg/dL 14  13  18    Creatinine 0.44 - 1.00 mg/dL 0.67  0.54  0.56   Sodium 135 - 145 mmol/L 135  138  137   Potassium 3.5 - 5.1 mmol/L 3.9  3.6  3.6   Chloride 98 - 111 mmol/L 105  103  102   CO2 22 - 32 mmol/L 27  28  29    Calcium 8.9 - 10.3 mg/dL 7.9  8.6  8.4   Total Protein 6.5 -  8.1  g/dL 5.8  5.6  5.7   Total Bilirubin 0.3 - 1.2 mg/dL 0.5  0.5  0.4   Alkaline Phos 38 - 126 U/L 82  85  85   AST 15 - 41 U/L 20  18  18    ALT 0 - 44 U/L 12  16  14       No results found for: "CEA1", "CEA" / No results found for: "CEA1", "CEA" No results found for: "PSA1" No results found for: "[id number]" No results found for: "CAN125"  Lab Results  Component Value Date   TOTALPROTELP 5.2 (L) 06/07/2022   ALBUMINELP 2.9 06/07/2022   A1GS 0.3 06/07/2022   A2GS 0.6 06/07/2022   BETS 0.8 06/07/2022   GAMS 0.6 06/07/2022   MSPIKE 0.3 (H) 06/07/2022   SPEI Comment 06/07/2022   Lab Results  Component Value Date   TIBC 375 06/07/2022   TIBC 425 03/31/2022   TIBC 438 03/01/2022   FERRITIN 51 06/07/2022   FERRITIN 66 03/31/2022   FERRITIN 7 (L) 03/01/2022   IRONPCTSAT 27 06/07/2022   IRONPCTSAT 9 (L) 03/31/2022   IRONPCTSAT 5 (L) 03/01/2022   Lab Results  Component Value Date   LDH 92 (L) 10/22/2021   LDH 101 06/25/2021   LDH 103 03/24/2021     STUDIES:   ECHOCARDIOGRAM COMPLETE  Result Date: 06/03/2022    ECHOCARDIOGRAM REPORT   Patient Name:   Savannah Burns Date of Exam: 06/03/2022 Medical Rec #:  [id number]          Height:       62.0 in Accession #:    [id number]         Weight:       288.0 lb Date of Birth:  06/06/1950          BSA:          2.232 m Patient Age:    72 years           BP:           136/64 mmHg Patient Gender: F                  HR:           71 bpm. Exam Location:  [facility] Procedure: 2D Echo, Cardiac Doppler and Color Doppler Indications:    I50.32 (ICD-10-CM) - Chronic diastolic HF (heart failure) ([place])  History:        Patient has prior history of Echocardiogram examinations, most                 recent 05/07/2019. CHF, CAD, COPD; Risk Factors:Hypertension,                 Diabetes, Dyslipidemia and Current Smoker.  Sonographer:    Alvino Chapel RCS Referring Phys: [id number] SHERI HAMMOCK IMPRESSIONS  1. Left ventricular ejection fraction, by estimation,  is >75%. The left ventricle has hyperdynamic function. The left ventricle has no regional wall motion abnormalities. There is mild left ventricular hypertrophy. Left ventricular diastolic parameters are consistent with Grade I diastolic dysfunction (impaired relaxation).  2. Right ventricular systolic function is normal. The right ventricular size is normal. Tricuspid regurgitation signal is inadequate for assessing PA pressure.  3. Left atrial size was moderately dilated.  4. Right atrial size was moderately dilated.  5. The mitral valve is abnormal. No evidence of mitral valve regurgitation. No evidence of mitral stenosis. Moderate mitral annular calcification.  6. The aortic valve is calcified. There is  severe calcifcation of the aortic valve. Aortic valve regurgitation is not visualized. Moderate aortic valve stenosis. Aortic valve area, by VTI measures 1.12 cm. Aortic valve mean gradient measures 23.0 mmHg.  Aortic valve Vmax measures 3.42 m/s.  7. The inferior vena cava is dilated in size with >50% respiratory variability, suggesting right atrial pressure of 8 mmHg.  8. Increased flow velocities may be secondary to anemia, thyrotoxicosis, hyperdynamic or high flow state. Comparison(s): Changes from prior study are noted. Mild AS in 04/2019 worsened to Moderate AS now. FINDINGS  Left Ventricle: Left ventricular ejection fraction, by estimation, is >75%. The left ventricle has hyperdynamic function. The left ventricle has no regional wall motion abnormalities. The left ventricular internal cavity size was normal in size. There is mild left ventricular hypertrophy. Left ventricular diastolic parameters are consistent with Grade I diastolic dysfunction (impaired relaxation). Right Ventricle: The right ventricular size is normal. No increase in right ventricular wall thickness. Right ventricular systolic function is normal. Tricuspid regurgitation signal is inadequate for assessing PA pressure. Left Atrium: Left  atrial size was moderately dilated. Right Atrium: Right atrial size was moderately dilated. Pericardium: There is no evidence of pericardial effusion. Mitral Valve: The mitral valve is abnormal. Moderate mitral annular calcification. No evidence of mitral valve regurgitation. No evidence of mitral valve stenosis. Tricuspid Valve: The tricuspid valve is not well visualized. Tricuspid valve regurgitation is not demonstrated. No evidence of tricuspid stenosis. Aortic Valve: The aortic valve is calcified. There is severe calcifcation of the aortic valve. Aortic valve regurgitation is not visualized. Moderate aortic stenosis is present. Aortic valve mean gradient measures 23.0 mmHg. Aortic valve peak gradient measures 46.8 mmHg. Aortic valve area, by VTI measures 1.12 cm. Pulmonic Valve: The pulmonic valve was not well visualized. Pulmonic valve regurgitation is not visualized. No evidence of pulmonic stenosis. Aorta: The aortic root is normal in size and structure. Venous: The inferior vena cava is dilated in size with greater than 50% respiratory variability, suggesting right atrial pressure of 8 mmHg. IAS/Shunts: No atrial level shunt detected by color flow Doppler.  LEFT VENTRICLE PLAX 2D LVIDd:         5.70 cm   Diastology LVIDs:         2.40 cm   LV e' medial:    6.64 cm/s LV PW:         1.20 cm   LV E/e' medial:  17.6 LV IVS:        1.30 cm   LV e' lateral:   7.51 cm/s LVOT diam:     1.70 cm   LV E/e' lateral: 15.6 LV SV:         85 LV SV Index:   38 LVOT Area:     2.27 cm  RIGHT VENTRICLE RV S prime:     16.40 cm/s TAPSE (M-mode): 3.0 cm LEFT ATRIUM             Index        RIGHT ATRIUM           Index LA diam:        4.10 cm 1.84 cm/m   RA Area:     22.50 cm LA Vol (A2C):   84.8 ml 37.99 ml/m  RA Volume:   75.20 ml  33.69 ml/m LA Vol (A4C):   94.4 ml 42.29 ml/m LA Biplane Vol: 94.1 ml 42.16 ml/m  AORTIC VALVE AV Area (Vmax):    1.06 cm AV Area (Vmean):   0.98 cm  AV Area (VTI):     1.12 cm AV Vmax:            342.00 cm/s AV Vmean:          218.000 cm/s AV VTI:            0.758 m AV Peak Grad:      46.8 mmHg AV Mean Grad:      23.0 mmHg LVOT Vmax:         160.00 cm/s LVOT Vmean:        93.700 cm/s LVOT VTI:          0.375 m LVOT/AV VTI ratio: 0.49  AORTA Ao Root diam: 3.30 cm MITRAL VALVE MV Area (PHT): 2.22 cm     SHUNTS MV Decel Time: 342 msec     Systemic VTI:  0.38 m MV E velocity: 117.00 cm/s  Systemic Diam: 1.70 cm MV A velocity: 155.00 cm/s MV E/A ratio:  0.75 Vishnu Priya Mallipeddi Electronically signed by Lorelee Cover Mallipeddi Signature Date/Time: 06/03/2022/5:36:19 PM    Final

## 2022-06-21 NOTE — Patient Instructions (Addendum)
Savannah Burns at [facility] Discharge Instructions   You were seen and examined today by Dr. Delton Coombes.  He reviewed the results of your lab work which are normal/stable.   We will proceed with Venofer infusion today.   Return as scheduled.    Thank you for choosing [place] [facility] to provide your oncology and hematology care.  To afford each patient quality time with our provider, please arrive at least 15 minutes before your scheduled appointment time.   If you have a lab appointment with the [facility] please come in thru the Main Entrance and check in at the main information desk.  You need to re-schedule your appointment should you arrive 10 or more minutes late.  We strive to give you quality time with our providers, and arriving late affects you and other patients whose appointments are after yours.  Also, if you no show three or more times for appointments you may be dismissed from the clinic at the providers discretion.     Again, thank you for choosing [facility].  Our hope is that these requests will decrease the amount of time that you wait before being seen by our physicians.       _____________________________________________________________  Should you have questions after your visit to [facility], please contact our office at [phone] and follow the prompts.  Our office hours are 8:00 a.m. and 4:30 p.m. Monday - Friday.  Please note that voicemails left after 4:00 p.m. may not be returned until the following business day.  We are closed weekends and major holidays.  You do have access to a nurse 24-7, just call the main number to the clinic [phone] and do not press any options, hold on the line and a nurse will answer the phone.    For prescription refill requests, have your pharmacy contact our office and allow 72 hours.    Due to Covid, you will need to wear a mask upon entering  the hospital. If you do not have a mask, a mask will be given to you at the Main Entrance upon arrival. For doctor visits, patients may have 1 support person age 61 or older with them. For treatment visits, patients can not have anyone with them due to social distancing guidelines and our immunocompromised population.

## 2022-06-21 NOTE — Progress Notes (Signed)
Pt presents today for Venofer IV iron infusion per provider's order. Vital signs stable and pt voiced no new complaints at this time.   Pt took pre-med Claritin at home prior to arrival. [facility] will be given every 4 weeks. Pt will receive Venofer 500 every 2 weeks per Dr.K.  Venofer 500 mg given today per MD orders. Tolerated infusion without adverse affects. Vital signs stable. No complaints at this time. Discharged from clinic via wheelchair in stable condition. Alert and oriented x 3. F/U with [facility] as scheduled.

## 2022-06-21 NOTE — Progress Notes (Signed)
Patient is taking Revlimid as prescribed.  She has not missed any doses and reports no side effects at this time.  ? ?

## 2022-06-27 ENCOUNTER — Other Ambulatory Visit: Payer: Self-pay

## 2022-06-27 DIAGNOSIS — C9000 Multiple myeloma not having achieved remission: Secondary | ICD-10-CM

## 2022-06-27 MED ORDER — LENALIDOMIDE 15 MG PO CAPS: ORAL_CAPSULE | ORAL | 0 refills | Status: DC

## 2022-06-27 NOTE — Telephone Encounter (Signed)
Chart reviewed. Revlimid refilled per last office note with Dr. Katragadda.  

## 2022-06-29 DIAGNOSIS — M5136 Other intervertebral disc degeneration, lumbar region: Secondary | ICD-10-CM | POA: Diagnosis not present

## 2022-06-29 DIAGNOSIS — F1120 Opioid dependence, uncomplicated: Secondary | ICD-10-CM | POA: Diagnosis not present

## 2022-06-29 DIAGNOSIS — C9000 Multiple myeloma not having achieved remission: Secondary | ICD-10-CM | POA: Diagnosis not present

## 2022-06-29 DIAGNOSIS — R030 Elevated blood-pressure reading, without diagnosis of hypertension: Secondary | ICD-10-CM | POA: Diagnosis not present

## 2022-06-29 DIAGNOSIS — Z6841 Body Mass Index (BMI) 40.0 and over, adult: Secondary | ICD-10-CM | POA: Diagnosis not present

## 2022-06-29 DIAGNOSIS — Z79899 Other long term (current) drug therapy: Secondary | ICD-10-CM | POA: Diagnosis not present

## 2022-06-29 DIAGNOSIS — E119 Type 2 diabetes mellitus without complications: Secondary | ICD-10-CM | POA: Diagnosis not present

## 2022-06-29 DIAGNOSIS — G894 Chronic pain syndrome: Secondary | ICD-10-CM | POA: Diagnosis not present

## 2022-07-03 ENCOUNTER — Encounter: Payer: Self-pay | Admitting: Orthopedic Surgery

## 2022-07-03 NOTE — Progress Notes (Signed)
Office Visit Note   Patient: Savannah Burns           Date of Birth: 25-Feb-1951           MRN: [id number] Visit Date: 06/20/2022              Requested by: Raliegh Ip, DO [address] [place],  [place] [address] PCP: Raliegh Ip, DO  Chief Complaint  Patient presents with   Right Leg - Follow-up      HPI: Patient is a 72 year old woman who presents in follow-up for right lower extremity ulcer with venous and lymphatic insufficiency currently been in a Dynaflex wrap.  Patient does have lymphedema pumps pending.  Patient states she was surprised how much smaller her leg has been after compression wraps.  Assessment & Plan: Visit Diagnoses:  1. Chronic venous hypertension (idiopathic) with ulcer and inflammation of right lower extremity (HCC)   2. Lymphedema     Plan: Recommend continue compression routine wound care follow-up in 2 weeks  Follow-Up Instructions: Return in about 2 weeks (around 07/04/2022).   Ortho Exam  Patient is alert, oriented, no adenopathy, well-dressed, normal affect, normal respiratory effort. Examination patient is on nasal cannula FiO2.  She does have swelling the ulcer is smaller.  Her calf measures 54 cm in circumference the ulcer is 15 mm in circumference.  Imaging: No results found. No images are attached to the encounter.  Labs: Lab Results  Component Value Date   HGBA1C 6.5 (H) 02/25/2022   HGBA1C 5.7 (H) 12/14/2021   HGBA1C 5.2 12/09/2020   ESRSEDRATE 68 (H) 09/21/2016   CRP 11.9 (H) 09/21/2016   LABURIC 7.9 (H) 09/21/2016   LABORGA ESCHERICHIA COLI 04/30/2015     Lab Results  Component Value Date   ALBUMIN 3.1 (L) 06/07/2022   ALBUMIN 3.2 (L) 05/10/2022   ALBUMIN 3.2 (L) 04/05/2022    Lab Results  Component Value Date   MG 1.8 06/07/2022   MG 1.9 05/10/2022   MG 1.8 04/05/2022   Lab Results  Component Value Date   VD25OH 34.67 08/13/2021   VD25OH 36.63 07/09/2020   VD25OH 28.4 (L) 09/21/2016    No  results found for: "PREALBUMIN"    Latest Ref Rng & Units 06/07/2022    8:08 AM 05/24/2022    8:14 AM 05/10/2022    8:19 AM  CBC EXTENDED  WBC 4.0 - 10.5 K/uL 3.4  3.0  2.6   RBC 3.87 - 5.11 MIL/uL 2.92  2.75  3.03   Hemoglobin 12.0 - 15.0 g/dL 8.6  8.2  8.9   HCT 84.6 - 46.0 % 28.4  27.0  29.7   Platelets 150 - 400 K/uL 101  107  106   NEUT# 1.7 - 7.7 K/uL 1.7  1.4  1.3   Lymph# 0.7 - 4.0 K/uL 1.1  1.0  0.8      There is no height or weight on file to calculate BMI.  Orders:  No orders of the defined types were placed in this encounter.  No orders of the defined types were placed in this encounter.    Procedures: No procedures performed  Clinical Data: No additional findings.  ROS:  All other systems negative, except as noted in the HPI. Review of Systems  Objective: Vital Signs: There were no vitals taken for this visit.  Specialty Comments:  No specialty comments available.  PMFS History: Patient Active Problem List   Diagnosis Date Noted   Diabetic  peripheral neuropathy associated with type 2 diabetes mellitus 02/25/2022   Multiple myeloma 11/30/2021   H. pylori infection 08/12/2021   Cirrhosis of liver 08/04/2021   Hypoalbuminemia 05/17/2020   Moderate protein malnutrition 05/17/2020   Melena 05/16/2020   Hyperlipidemia    Hypokalemia    GI bleed 03/20/2020   Lower GI bleed 03/19/2020   Acute on chronic congestive heart failure    Symptomatic anemia    Iron deficiency anemia    Heme positive stool    ABLA (acute blood loss anemia) 01/22/2020   Supplemental oxygen dependent 10/22/2019   Chronic dyspnea 10/22/2019   Hypertension associated with diabetes 10/22/2019   Bilateral hand pain 09/21/2016   Fatigue 07/30/2015   Chronic diastolic HF (heart failure) 06/29/2015   Postmenopausal bleeding 04/17/2015   Excessive daytime sleepiness 12/19/2014   Swelling of lower extremity 11/27/2014   Back pain 06/13/2013   Sinusitis, chronic 05/30/2012    Allergic rhinitis 06/06/2011   Hyperlipidemia associated with type 2 diabetes mellitus 03/24/2009   Diabetes mellitus, type 2 08/12/2008   Pulmonary nodule 08/29/2007   COPD (chronic obstructive pulmonary disease) 02/14/2007   Class 3 obesity 05/25/2006   Depression, recurrent 05/25/2006   HYPERTENSION, BENIGN SYSTEMIC 05/25/2006   DJD, UNSPECIFIED 05/25/2006   Past Medical History:  Diagnosis Date   Adrenal adenoma, left    Stable   Anxiety    Arthritis    bilateral hands   Depression    Diabetes mellitus, type 2 08/12/2008   Qualifier: Diagnosis of  By: Dayton MartesAron MD, Talia     Dyspnea    Esophageal varices    Grade II diastolic dysfunction    History of kidney stones    Hyperlipidemia    Hypertension    Lower back pain    Lower GI bleed 03/19/2020   Panic attacks    Pneumonia    currently taking antibiotic and prednisone for early stages of pneumonia   Pulmonary nodules    bilateral   Skin cancer    face    Family History  Problem Relation Age of Onset   Diabetes Father    Heart disease Father [id number]       MI   Hypertension Father    Anemia Mother        Transfusion dependent   COPD Sister    Cancer Paternal Grandmother [id number]       Pancreatic    Past Surgical History:  Procedure Laterality Date   BIOPSY  04/07/2020   Procedure: BIOPSY;  Surgeon: Lanelle Balarver, Charles K, DO;  [facility]: [facility];  Service: Endoscopy;;   Breast Cystectomy  Right    CESAREAN SECTION     COLONOSCOPY WITH PROPOFOL N/A 01/25/2020   Dr. Marletta Lorarver: Nonbleeding internal hemorrhoids, diverticulosis, 5 mm polyp removed from the ascending colon, 10 mm polyp removed from the sigmoid colon, 30 mm polyp (tubulovillous adenoma with no high-grade dysplasia) removed from the transverse colon via piecemeal status post tattoo.  Other polyps were tubular adenomas.  3 month surveillance colonoscopy recommended.   COLONOSCOPY WITH PROPOFOL N/A 04/07/2020   Procedure: COLONOSCOPY WITH PROPOFOL;  Surgeon: Lanelle Balarver,  Charles K, DO;  Location: [facility];  Service: Endoscopy;  Laterality: N/A;  3:00pm, pt knows new time per office   CYSTOSCOPY/URETEROSCOPY/HOLMIUM LASER/STENT PLACEMENT Bilateral 03/01/2019   Procedure: CYSTOSCOPY/RETROGRADEURETEROSCOPY/HOLMIUM LASER/STENT PLACEMENT;  Surgeon: Rene PaciWinter, Christopher Aaron, MD;  Location: [facility];  Service: Urology;  Laterality: Bilateral;  ONLY NEEDS 60 MIN   ESOPHAGOGASTRODUODENOSCOPY (EGD) WITH PROPOFOL N/A  01/25/2020   Dr. Marletta Lor: 4 columns grade 1 esophageal varices   ESOPHAGOGASTRODUODENOSCOPY (EGD) WITH PROPOFOL N/A 05/18/2020   Procedure: ESOPHAGOGASTRODUODENOSCOPY (EGD) WITH PROPOFOL;  Surgeon: Lanelle Bal, DO;  [facility];  Service: Endoscopy;  Laterality: N/A;   ESOPHAGOGASTRODUODENOSCOPY (EGD) WITH PROPOFOL N/A 07/28/2020   Procedure: ESOPHAGOGASTRODUODENOSCOPY (EGD) WITH PROPOFOL;  Surgeon: Lanelle Bal, DO;  [facility];  Service: Endoscopy;  Laterality: N/A;  am or early PM due to givens capsule placement   GIVENS CAPSULE STUDY N/A 05/18/2020   Procedure: GIVENS CAPSULE STUDY;  Surgeon: Dolores Frame, MD;  [facility];  Service: Gastroenterology;  Laterality: N/A;   GIVENS CAPSULE STUDY N/A 07/28/2020   Procedure: GIVENS CAPSULE STUDY;  Surgeon: Lanelle Bal, DO;  [facility];  Service: Endoscopy;  Laterality: N/A;   IR IMAGING GUIDED PORT INSERTION  12/02/2021   POLYPECTOMY  01/25/2020   Procedure: POLYPECTOMY;  Surgeon: Lanelle Bal, DO;  [facility];  Service: Endoscopy;;   POLYPECTOMY  04/07/2020   Procedure: POLYPECTOMY INTESTINAL;  Surgeon: Lanelle Bal, DO;  [facility];  Service: Endoscopy;;   SKIN CANCER EXCISION     Face   SPINE SURGERY     SUBMUCOSAL TATTOO INJECTION  01/25/2020   Procedure: SUBMUCOSAL TATTOO INJECTION;  Surgeon: Lanelle Bal, DO;  [facility];  Service: Endoscopy;;   Social History   Occupational  History   Occupation: Retired   Tobacco Use   Smoking status: Former    Packs/day: 1.50    Years: 40.00    Additional pack years: 0.00    Total pack years: 60.00    Types: Cigarettes    Quit date: 04/29/2015    Years since quitting: 7.1   Smokeless tobacco: Never   Tobacco comments:    Quit smoking 04/2015- Previous 1.5 ppd smoker  Vaping Use   Vaping Use: Never used  Substance and Sexual Activity   Alcohol use: No    Alcohol/week: 0.0 standard drinks of alcohol   Drug use: No   Sexual activity: Not Currently    Birth control/protection: Post-menopausal

## 2022-07-05 ENCOUNTER — Inpatient Hospital Stay: Payer: Medicare PPO

## 2022-07-05 ENCOUNTER — Ambulatory Visit: Payer: Medicare PPO | Admitting: Hematology

## 2022-07-05 ENCOUNTER — Inpatient Hospital Stay: Payer: Medicare PPO | Attending: Hematology

## 2022-07-05 VITALS — BP 139/44 | HR 73 | Temp 97.5°F | Resp 18

## 2022-07-05 VITALS — BP 140/53 | HR 72 | Temp 97.1°F | Resp 18

## 2022-07-05 DIAGNOSIS — Z5112 Encounter for antineoplastic immunotherapy: Secondary | ICD-10-CM | POA: Diagnosis present

## 2022-07-05 DIAGNOSIS — D509 Iron deficiency anemia, unspecified: Secondary | ICD-10-CM | POA: Diagnosis not present

## 2022-07-05 DIAGNOSIS — C9000 Multiple myeloma not having achieved remission: Secondary | ICD-10-CM

## 2022-07-05 DIAGNOSIS — D500 Iron deficiency anemia secondary to blood loss (chronic): Secondary | ICD-10-CM

## 2022-07-05 DIAGNOSIS — Z79899 Other long term (current) drug therapy: Secondary | ICD-10-CM | POA: Insufficient documentation

## 2022-07-05 DIAGNOSIS — E538 Deficiency of other specified B group vitamins: Secondary | ICD-10-CM | POA: Insufficient documentation

## 2022-07-05 DIAGNOSIS — Z95828 Presence of other vascular implants and grafts: Secondary | ICD-10-CM

## 2022-07-05 LAB — CBC WITH DIFFERENTIAL/PLATELET
Abs Immature Granulocytes: 0.01 10*3/uL (ref 0.00–0.07)
Basophils Absolute: 0 10*3/uL (ref 0.0–0.1)
Basophils Relative: 1 %
Eosinophils Absolute: 0.1 10*3/uL (ref 0.0–0.5)
Eosinophils Relative: 3 %
HCT: 29.6 % — ABNORMAL LOW (ref 36.0–46.0)
Hemoglobin: 8.8 g/dL — ABNORMAL LOW (ref 12.0–15.0)
Immature Granulocytes: 1 %
Lymphocytes Relative: 32 %
Lymphs Abs: 0.6 10*3/uL — ABNORMAL LOW (ref 0.7–4.0)
MCH: 29.6 pg (ref 26.0–34.0)
MCHC: 29.7 g/dL — ABNORMAL LOW (ref 30.0–36.0)
MCV: 99.7 fL (ref 80.0–100.0)
Monocytes Absolute: 0.2 10*3/uL (ref 0.1–1.0)
Monocytes Relative: 8 %
Neutro Abs: 1.1 10*3/uL — ABNORMAL LOW (ref 1.7–7.7)
Neutrophils Relative %: 55 %
Platelets: 89 10*3/uL — ABNORMAL LOW (ref 150–400)
RBC: 2.97 MIL/uL — ABNORMAL LOW (ref 3.87–5.11)
RDW: 16.9 % — ABNORMAL HIGH (ref 11.5–15.5)
WBC: 1.9 10*3/uL — ABNORMAL LOW (ref 4.0–10.5)
nRBC: 0 % (ref 0.0–0.2)

## 2022-07-05 MED ORDER — CYANOCOBALAMIN 1000 MCG/ML IJ SOLN
1000.0000 ug | Freq: Once | INTRAMUSCULAR | Status: AC
  Administered 2022-07-05: 1000 ug via INTRAMUSCULAR
  Filled 2022-07-05: qty 1

## 2022-07-05 MED ORDER — SODIUM CHLORIDE 0.9% FLUSH
10.0000 mL | INTRAVENOUS | Status: DC | PRN
  Administered 2022-07-05: 10 mL via INTRAVENOUS

## 2022-07-05 MED ORDER — HEPARIN SOD (PORK) LOCK FLUSH 100 UNIT/ML IV SOLN
500.0000 [IU] | Freq: Once | INTRAVENOUS | Status: AC | PRN
  Administered 2022-07-05: 500 [IU]

## 2022-07-05 MED ORDER — SODIUM CHLORIDE 0.9 % IV SOLN
500.0000 mg | Freq: Once | INTRAVENOUS | Status: AC
  Administered 2022-07-05: 500 mg via INTRAVENOUS
  Filled 2022-07-05: qty 25

## 2022-07-05 MED ORDER — DARATUMUMAB-HYALURONIDASE-FIHJ 1800-30000 MG-UT/15ML ~~LOC~~ SOLN
1800.0000 mg | Freq: Once | SUBCUTANEOUS | Status: AC
  Administered 2022-07-05: 1800 mg via SUBCUTANEOUS
  Filled 2022-07-05: qty 15

## 2022-07-05 MED ORDER — SODIUM CHLORIDE 0.9% FLUSH
10.0000 mL | Freq: Once | INTRAVENOUS | Status: AC | PRN
  Administered 2022-07-05: 10 mL

## 2022-07-05 MED ORDER — SODIUM CHLORIDE 0.9 % IV SOLN: Freq: Once | INTRAVENOUS | Status: AC

## 2022-07-05 NOTE — Patient Instructions (Signed)
[facility]  Discharge Instructions: Thank you for choosing [facility] to provide your oncology and hematology care.  If you have a lab appointment with the Cancer Center - please note that after April 8th, 2024, all labs will be drawn in the cancer center.  You do not have to check in or register with the main entrance as you have in the past but will complete your check-in in the cancer center.  Wear comfortable clothing and clothing appropriate for easy access to any Portacath or PICC line.   We strive to give you quality time with your provider. You may need to reschedule your appointment if you arrive late (15 or more minutes).  Arriving late affects you and other patients whose appointments are after yours.  Also, if you miss three or more appointments without notifying the office, you may be dismissed from the clinic at the provider's discretion.      For prescription refill requests, have your pharmacy contact our office and allow 72 hours for refills to be completed.    Today you received the following chemotherapy and/or immunotherapy agents Daratumumab/B12/Venofer      To help prevent nausea and vomiting after your treatment, we encourage you to take your nausea medication as directed.  BELOW ARE SYMPTOMS THAT SHOULD BE REPORTED IMMEDIATELY: *FEVER GREATER THAN 100.4 F (38 C) OR HIGHER *CHILLS OR SWEATING *NAUSEA AND VOMITING THAT IS NOT CONTROLLED WITH YOUR NAUSEA MEDICATION *UNUSUAL SHORTNESS OF BREATH *UNUSUAL BRUISING OR BLEEDING *URINARY PROBLEMS (pain or burning when urinating, or frequent urination) *BOWEL PROBLEMS (unusual diarrhea, constipation, pain near the anus) TENDERNESS IN MOUTH AND THROAT WITH OR WITHOUT PRESENCE OF ULCERS (sore throat, sores in mouth, or a toothache) UNUSUAL RASH, SWELLING OR PAIN  UNUSUAL VAGINAL DISCHARGE OR ITCHING   Items with * indicate a potential emergency and should be followed up as soon as possible  or go to the Emergency Department if any problems should occur.  Please show the CHEMOTHERAPY ALERT CARD or IMMUNOTHERAPY ALERT CARD at check-in to the Emergency Department and triage nurse.  Should you have questions after your visit or need to cancel or reschedule your appointment, please contact [facility] [phone]  and follow the prompts.  Office hours are 8:00 a.m. to 4:30 p.m. Monday - Friday. Please note that voicemails left after 4:00 p.m. may not be returned until the following business day.  We are closed weekends and major holidays. You have access to a nurse at all times for urgent questions. Please call the main number to the clinic [phone] and follow the prompts.  For any non-urgent questions, you may also contact your provider using MyChart. We now offer e-Visits for anyone 34 and older to request care online for non-urgent symptoms. For details visit [email or web address].   Also download the MyChart app! Go to the app store, search "MyChart", open the app, select [place], and log in with your MyChart username and password.

## 2022-07-05 NOTE — Progress Notes (Signed)
Patients port flushed without difficulty.  Good blood return noted with no bruising or swelling noted at site.  Patient remains accessed for treatment.  

## 2022-07-05 NOTE — Progress Notes (Signed)
OK to proceed with platelets 89k.  V.O. Dr Carilyn Goodpasture, PharmD

## 2022-07-05 NOTE — Progress Notes (Signed)
Patient stated she took all pre-meds at home.

## 2022-07-05 NOTE — Progress Notes (Signed)
Patient presents today for Daratumumab/B12/Venofer per providers order.  Vital signs within parameters for treatment.  Labs pending.  Patient has no new complaints at this time.  Platelets noted to be 89, MD notified.  Message received from Chapman Moss RN/Dr. Ellin Saba, okay to proceed with treatment.   Stable during infusion without adverse affects.  Daratumumab administration without incident; injection site WNL; see MAR for injection details.  Vital signs stable.  No complaints at this time.  Discharge from clinic via wheelchair in stable condition.  Alert and oriented X 3.  Follow up with [facility] as scheduled.

## 2022-07-07 ENCOUNTER — Ambulatory Visit: Payer: Medicare PPO | Admitting: Orthopedic Surgery

## 2022-07-07 DIAGNOSIS — I87331 Chronic venous hypertension (idiopathic) with ulcer and inflammation of right lower extremity: Secondary | ICD-10-CM

## 2022-07-07 DIAGNOSIS — I890 Lymphedema, not elsewhere classified: Secondary | ICD-10-CM

## 2022-07-08 ENCOUNTER — Encounter: Payer: Self-pay | Admitting: Orthopedic Surgery

## 2022-07-08 NOTE — Progress Notes (Addendum)
Office Visit Note   Patient: Savannah Burns           Date of Birth: [birth year]           MRN: [id number] Visit Date: 07/07/2022              Requested by: Raliegh Ip, DO [address] [place],  [place] [address] PCP: Raliegh Ip, DO  Chief Complaint  Patient presents with   Right Leg - Follow-up      HPI: Patient is a 72 year old woman who presents in follow-up for right leg ulcer.  Patient is continually undergoing routine wound care with compression.  She is being evaluated today with a demonstration of the lymphedema pump.  Insurance denied Kerecis tissue graft.  Assessment & Plan: Visit Diagnoses:  1. Chronic venous hypertension (idiopathic) with ulcer and inflammation of right lower extremity   2. Lymphedema     Plan: Will continue with compression routine wound care and anticipate starting lymphedema pumps soon.  This is an addendum on September 06, 2022 that patient has failed 4 weeks of conservative treatment with bilateral lower extremity compression exercise and elevation.  I attest to this to provide lymphedema pumps for patient's medical treatment.  Follow-Up Instructions: Return in about 4 weeks (around 08/04/2022).   Ortho Exam  Patient is alert, oriented, no adenopathy, well-dressed, normal affect, normal respiratory effort. Examination the right leg ulcer is 20 x 5 cm on the right calf there is no cellulitis no exposed bone or tendon.  Patient does have venous and lymphatic insufficiency with pitting edema.  Imaging: No results found. No images are attached to the encounter.  Labs: Lab Results  Component Value Date   HGBA1C 6.5 (H) 02/25/2022   HGBA1C 5.7 (H) 12/14/2021   HGBA1C 5.2 12/09/2020   ESRSEDRATE 68 (H) 09/21/2016   CRP 11.9 (H) 09/21/2016   LABURIC 7.9 (H) 09/21/2016   LABORGA ESCHERICHIA COLI 04/30/2015     Lab Results  Component Value Date   ALBUMIN 3.1 (L) 06/07/2022   ALBUMIN 3.2 (L) 05/10/2022   ALBUMIN 3.2 (L)  04/05/2022    Lab Results  Component Value Date   MG 1.8 06/07/2022   MG 1.9 05/10/2022   MG 1.8 04/05/2022   Lab Results  Component Value Date   VD25OH 34.67 08/13/2021   VD25OH 36.63 07/09/2020   VD25OH 28.4 (L) 09/21/2016    No results found for: "PREALBUMIN"    Latest Ref Rng & Units 07/05/2022    8:19 AM 06/07/2022    8:08 AM 05/24/2022    8:14 AM  CBC EXTENDED  WBC 4.0 - 10.5 K/uL 1.9  3.4  3.0   RBC 3.87 - 5.11 MIL/uL 2.97  2.92  2.75   Hemoglobin 12.0 - 15.0 g/dL 8.8  8.6  8.2   HCT 82.9 - 46.0 % 29.6  28.4  27.0   Platelets 150 - 400 K/uL 89  101  107   NEUT# 1.7 - 7.7 K/uL 1.1  1.7  1.4   Lymph# 0.7 - 4.0 K/uL 0.6  1.1  1.0      There is no height or weight on file to calculate BMI.  Orders:  No orders of the defined types were placed in this encounter.  No orders of the defined types were placed in this encounter.    Procedures: No procedures performed  Clinical Data: No additional findings.  ROS:  All other systems negative, except as noted in the HPI.  Review of Systems  Objective: Vital Signs: There were no vitals taken for this visit.  Specialty Comments:  No specialty comments available.  PMFS History: Patient Active Problem List   Diagnosis Date Noted   Diabetic peripheral neuropathy associated with type 2 diabetes mellitus 02/25/2022   Multiple myeloma 11/30/2021   H. pylori infection 08/12/2021   Cirrhosis of liver 08/04/2021   Hypoalbuminemia 05/17/2020   Moderate protein malnutrition 05/17/2020   Melena 05/16/2020   Hyperlipidemia    Hypokalemia    GI bleed 03/20/2020   Lower GI bleed 03/19/2020   Acute on chronic congestive heart failure    Symptomatic anemia    Iron deficiency anemia    Heme positive stool    ABLA (acute blood loss anemia) 01/22/2020   Supplemental oxygen dependent 10/22/2019   Chronic dyspnea 10/22/2019   Hypertension associated with diabetes 10/22/2019   Bilateral hand pain 09/21/2016   Fatigue  07/30/2015   Chronic diastolic HF (heart failure) 06/29/2015   Postmenopausal bleeding 04/17/2015   Excessive daytime sleepiness 12/19/2014   Swelling of lower extremity 11/27/2014   Back pain 06/13/2013   Sinusitis, chronic 05/30/2012   Allergic rhinitis 06/06/2011   Hyperlipidemia associated with type 2 diabetes mellitus 03/24/2009   Diabetes mellitus, type 2 08/12/2008   Pulmonary nodule 08/29/2007   COPD (chronic obstructive pulmonary disease) 02/14/2007   Class 3 obesity 05/25/2006   Depression, recurrent 05/25/2006   HYPERTENSION, BENIGN SYSTEMIC 05/25/2006   DJD, UNSPECIFIED 05/25/2006   Past Medical History:  Diagnosis Date   Adrenal adenoma, left    Stable   Anxiety    Arthritis    bilateral hands   Depression    Diabetes mellitus, type 2 08/12/2008   Qualifier: Diagnosis of  By: Dayton Martes MD, Talia     Dyspnea    Esophageal varices    Grade II diastolic dysfunction    History of kidney stones    Hyperlipidemia    Hypertension    Lower back pain    Lower GI bleed 03/19/2020   Panic attacks    Pneumonia    currently taking antibiotic and prednisone for early stages of pneumonia   Pulmonary nodules    bilateral   Skin cancer    face    Family History  Problem Relation Age of Onset   Diabetes Father    Heart disease Father 63       MI   Hypertension Father    Anemia Mother        Transfusion dependent   COPD Sister    Cancer Paternal Grandmother 56       Pancreatic    Past Surgical History:  Procedure Laterality Date   BIOPSY  04/07/2020   Procedure: BIOPSY;  Surgeon: Lanelle Bal, DO;  [facility];  Service: Endoscopy;;   Breast Cystectomy  Right    CESAREAN SECTION     COLONOSCOPY WITH PROPOFOL N/A 01/25/2020   Dr. Marletta Lor: Nonbleeding internal hemorrhoids, diverticulosis, 5 mm polyp removed from the ascending colon, 10 mm polyp removed from the sigmoid colon, 30 mm polyp (tubulovillous adenoma with no high-grade dysplasia) removed from  the transverse colon via piecemeal status post tattoo.  Other polyps were tubular adenomas.  3 month surveillance colonoscopy recommended.   COLONOSCOPY WITH PROPOFOL N/A 04/07/2020   Procedure: COLONOSCOPY WITH PROPOFOL;  Surgeon: Lanelle Bal, DO;  [facility];  Service: Endoscopy;  Laterality: N/A;  3:00pm, pt knows new time per office   CYSTOSCOPY/URETEROSCOPY/HOLMIUM  LASER/STENT PLACEMENT Bilateral 03/01/2019   Procedure: CYSTOSCOPY/RETROGRADEURETEROSCOPY/HOLMIUM LASER/STENT PLACEMENT;  Surgeon: Rene Paci, MD;  Location: [facility];  Service: Urology;  Laterality: Bilateral;  ONLY NEEDS 60 MIN   ESOPHAGOGASTRODUODENOSCOPY (EGD) WITH PROPOFOL N/A 01/25/2020   Dr. Marletta Lor: 4 columns grade 1 esophageal varices   ESOPHAGOGASTRODUODENOSCOPY (EGD) WITH PROPOFOL N/A 05/18/2020   Procedure: ESOPHAGOGASTRODUODENOSCOPY (EGD) WITH PROPOFOL;  Surgeon: Lanelle Bal, DO;  [facility];  Service: Endoscopy;  Laterality: N/A;   ESOPHAGOGASTRODUODENOSCOPY (EGD) WITH PROPOFOL N/A 07/28/2020   Procedure: ESOPHAGOGASTRODUODENOSCOPY (EGD) WITH PROPOFOL;  Surgeon: Lanelle Bal, DO;  [facility];  Service: Endoscopy;  Laterality: N/A;  am or early PM due to givens capsule placement   GIVENS CAPSULE STUDY N/A 05/18/2020   Procedure: GIVENS CAPSULE STUDY;  Surgeon: Dolores Frame, MD;  [facility];  Service: Gastroenterology;  Laterality: N/A;   GIVENS CAPSULE STUDY N/A 07/28/2020   Procedure: GIVENS CAPSULE STUDY;  Surgeon: Lanelle Bal, DO;  [facility];  Service: Endoscopy;  Laterality: N/A;   IR IMAGING GUIDED PORT INSERTION  12/02/2021   POLYPECTOMY  01/25/2020   Procedure: POLYPECTOMY;  Surgeon: Lanelle Bal, DO;  [facility];  Service: Endoscopy;;   POLYPECTOMY  04/07/2020   Procedure: POLYPECTOMY INTESTINAL;  Surgeon: Lanelle Bal, DO;  [facility];  Service: Endoscopy;;   SKIN CANCER  EXCISION     Face   SPINE SURGERY     SUBMUCOSAL TATTOO INJECTION  01/25/2020   Procedure: SUBMUCOSAL TATTOO INJECTION;  Surgeon: Lanelle Bal, DO;  [facility];  Service: Endoscopy;;   Social History   Occupational History   Occupation: Retired   Tobacco Use   Smoking status: Former    Packs/day: 1.50    Years: 40.00    Additional pack years: 0.00    Total pack years: 60.00    Types: Cigarettes    Quit date: 04/29/2015    Years since quitting: 7.1   Smokeless tobacco: Never   Tobacco comments:    Quit smoking 04/2015- Previous 1.5 ppd smoker  Vaping Use   Vaping Use: Never used  Substance and Sexual Activity   Alcohol use: No    Alcohol/week: 0.0 standard drinks of alcohol   Drug use: No   Sexual activity: Not Currently    Birth control/protection: Post-menopausal

## 2022-07-19 ENCOUNTER — Inpatient Hospital Stay: Payer: Medicare PPO

## 2022-07-19 VITALS — BP 122/56 | HR 73 | Temp 98.0°F | Resp 20

## 2022-07-19 DIAGNOSIS — Z5112 Encounter for antineoplastic immunotherapy: Secondary | ICD-10-CM | POA: Diagnosis not present

## 2022-07-19 DIAGNOSIS — C9000 Multiple myeloma not having achieved remission: Secondary | ICD-10-CM

## 2022-07-19 DIAGNOSIS — Z79899 Other long term (current) drug therapy: Secondary | ICD-10-CM | POA: Diagnosis not present

## 2022-07-19 DIAGNOSIS — E538 Deficiency of other specified B group vitamins: Secondary | ICD-10-CM | POA: Diagnosis not present

## 2022-07-19 DIAGNOSIS — D509 Iron deficiency anemia, unspecified: Secondary | ICD-10-CM | POA: Diagnosis not present

## 2022-07-19 DIAGNOSIS — D500 Iron deficiency anemia secondary to blood loss (chronic): Secondary | ICD-10-CM

## 2022-07-19 LAB — CBC WITH DIFFERENTIAL/PLATELET
Abs Immature Granulocytes: 0.01 10*3/uL (ref 0.00–0.07)
Basophils Absolute: 0 10*3/uL (ref 0.0–0.1)
Basophils Relative: 0 %
Eosinophils Absolute: 0 10*3/uL (ref 0.0–0.5)
Eosinophils Relative: 1 %
HCT: 27.9 % — ABNORMAL LOW (ref 36.0–46.0)
Hemoglobin: 8.2 g/dL — ABNORMAL LOW (ref 12.0–15.0)
Immature Granulocytes: 1 %
Lymphocytes Relative: 28 %
Lymphs Abs: 0.6 10*3/uL — ABNORMAL LOW (ref 0.7–4.0)
MCH: 29.6 pg (ref 26.0–34.0)
MCHC: 29.4 g/dL — ABNORMAL LOW (ref 30.0–36.0)
MCV: 100.7 fL — ABNORMAL HIGH (ref 80.0–100.0)
Monocytes Absolute: 0.1 10*3/uL (ref 0.1–1.0)
Monocytes Relative: 6 %
Neutro Abs: 1.4 10*3/uL — ABNORMAL LOW (ref 1.7–7.7)
Neutrophils Relative %: 64 %
Platelets: 105 10*3/uL — ABNORMAL LOW (ref 150–400)
RBC: 2.77 MIL/uL — ABNORMAL LOW (ref 3.87–5.11)
RDW: 17.2 % — ABNORMAL HIGH (ref 11.5–15.5)
WBC: 2.2 10*3/uL — ABNORMAL LOW (ref 4.0–10.5)
nRBC: 0 % (ref 0.0–0.2)

## 2022-07-19 MED ORDER — SODIUM CHLORIDE 0.9% FLUSH
10.0000 mL | INTRAVENOUS | Status: DC | PRN
  Administered 2022-07-19: 10 mL via INTRAVENOUS

## 2022-07-19 MED ORDER — HEPARIN SOD (PORK) LOCK FLUSH 100 UNIT/ML IV SOLN
500.0000 [IU] | Freq: Once | INTRAVENOUS | Status: AC | PRN
  Administered 2022-07-19: 500 [IU]

## 2022-07-19 MED ORDER — LORATADINE 10 MG PO TABS: 10.0000 mg | ORAL_TABLET | Freq: Once | ORAL | Status: DC

## 2022-07-19 MED ORDER — SODIUM CHLORIDE 0.9 % IV SOLN: Freq: Once | INTRAVENOUS | Status: AC

## 2022-07-19 MED ORDER — CETIRIZINE HCL 10 MG PO TABS: 10.0000 mg | ORAL_TABLET | Freq: Once | ORAL | Status: DC

## 2022-07-19 MED ORDER — SODIUM CHLORIDE 0.9 % IV SOLN
500.0000 mg | Freq: Once | INTRAVENOUS | Status: AC
  Administered 2022-07-19: 500 mg via INTRAVENOUS
  Filled 2022-07-19: qty 20

## 2022-07-19 MED ORDER — SODIUM CHLORIDE 0.9% FLUSH
10.0000 mL | Freq: Once | INTRAVENOUS | Status: AC | PRN
  Administered 2022-07-19: 10 mL

## 2022-07-19 NOTE — Progress Notes (Signed)
Patients port flushed without difficulty.  Good blood return noted with no bruising or swelling noted at site.  Patient remains accessed for treatment.  

## 2022-07-19 NOTE — Patient Instructions (Signed)
[facility]  Discharge Instructions: Thank you for choosing [facility] to provide your oncology and hematology care.  If you have a lab appointment with the Cancer Center - please note that after April 8th, 2024, all labs will be drawn in the cancer center.  You do not have to check in or register with the main entrance as you have in the past but will complete your check-in in the cancer center.  Wear comfortable clothing and clothing appropriate for easy access to any Portacath or PICC line.   We strive to give you quality time with your provider. You may need to reschedule your appointment if you arrive late (15 or more minutes).  Arriving late affects you and other patients whose appointments are after yours.  Also, if you miss three or more appointments without notifying the office, you may be dismissed from the clinic at the provider's discretion.      For prescription refill requests, have your pharmacy contact our office and allow 72 hours for refills to be completed.    Today you received the following Venofer  infusion.  Iron Sucrose Injection What is this medication? IRON SUCROSE (EYE ern SOO krose) treats low levels of iron (iron deficiency anemia) in people with kidney disease. Iron is a mineral that plays an important role in making red blood cells, which carry oxygen from your lungs to the rest of your body. This medicine may be used for other purposes; ask your health care provider or pharmacist if you have questions. COMMON BRAND NAME(S): Venofer What should I tell my care team before I take this medication? They need to know if you have any of these conditions: Anemia not caused by low iron levels Heart disease High levels of iron in the blood Kidney disease Liver disease An unusual or allergic reaction to iron, other medications, foods, dyes, or preservatives Pregnant or trying to get pregnant Breastfeeding How should I use this  medication? This medication is for infusion into a vein. It is given in a hospital or clinic setting. Talk to your care team about the use of this medication in children. While this medication may be prescribed for children as young as 2 years for selected conditions, precautions do apply. Overdosage: If you think you have taken too much of this medicine contact a poison control center or emergency room at once. NOTE: This medicine is only for you. Do not share this medicine with others. What if I miss a dose? Keep appointments for follow-up doses. It is important not to miss your dose. Call your care team if you are unable to keep an appointment. What may interact with this medication? Do not take this medication with any of the following: Deferoxamine Dimercaprol Other iron products This medication may also interact with the following: Chloramphenicol Deferasirox This list may not describe all possible interactions. Give your health care provider a list of all the medicines, herbs, non-prescription drugs, or dietary supplements you use. Also tell them if you smoke, drink alcohol, or use illegal drugs. Some items may interact with your medicine. What should I watch for while using this medication? Visit your care team regularly. Tell your care team if your symptoms do not start to get better or if they get worse. You may need blood work done while you are taking this medication. You may need to follow a special diet. Talk to your care team. Foods that contain iron include: whole grains/cereals, dried fruits,  beans, or peas, leafy green vegetables, and organ meats (liver, kidney). What side effects may I notice from receiving this medication? Side effects that you should report to your care team as soon as possible: Allergic reactions--skin rash, itching, hives, swelling of the face, lips, tongue, or throat Low blood pressure--dizziness, feeling faint or lightheaded, blurry vision Shortness of  breath Side effects that usually do not require medical attention (report to your care team if they continue or are bothersome): Flushing Headache Joint pain Muscle pain Nausea Pain, redness, or irritation at injection site This list may not describe all possible side effects. Call your doctor for medical advice about side effects. You may report side effects to FDA at 1-800-FDA-1088. Where should I keep my medication? This medication is given in a hospital or clinic and will not be stored at home. NOTE: This sheet is a summary. It may not cover all possible information. If you have questions about this medicine, talk to your doctor, pharmacist, or health care provider.  2023 Elsevier/Gold Standard (2020-06-25 00:00:00)      To help prevent nausea and vomiting after your treatment, we encourage you to take your nausea medication as directed.  BELOW ARE SYMPTOMS THAT SHOULD BE REPORTED IMMEDIATELY: *FEVER GREATER THAN 100.4 F (38 C) OR HIGHER *CHILLS OR SWEATING *NAUSEA AND VOMITING THAT IS NOT CONTROLLED WITH YOUR NAUSEA MEDICATION *UNUSUAL SHORTNESS OF BREATH *UNUSUAL BRUISING OR BLEEDING *URINARY PROBLEMS (pain or burning when urinating, or frequent urination) *BOWEL PROBLEMS (unusual diarrhea, constipation, pain near the anus) TENDERNESS IN MOUTH AND THROAT WITH OR WITHOUT PRESENCE OF ULCERS (sore throat, sores in mouth, or a toothache) UNUSUAL RASH, SWELLING OR PAIN  UNUSUAL VAGINAL DISCHARGE OR ITCHING   Items with * indicate a potential emergency and should be followed up as soon as possible or go to the Emergency Department if any problems should occur.  Please show the CHEMOTHERAPY ALERT CARD or IMMUNOTHERAPY ALERT CARD at check-in to the Emergency Department and triage nurse.  Should you have questions after your visit or need to cancel or reschedule your appointment, please contact [facility] AT [facility] [phone]  and follow the prompts.  Office hours are  8:00 a.m. to 4:30 p.m. Monday - Friday. Please note that voicemails left after 4:00 p.m. may not be returned until the following business day.  We are closed weekends and major holidays. You have access to a nurse at all times for urgent questions. Please call the main number to the clinic [phone] and follow the prompts.  For any non-urgent questions, you may also contact your provider using MyChart. We now offer e-Visits for anyone 5 and older to request care online for non-urgent symptoms. For details visit [email or web address].   Also download the MyChart app! Go to the app store, search "MyChart", open the app, select [place], and log in with your MyChart username and password.

## 2022-07-19 NOTE — Progress Notes (Signed)
Patient presents today for Venofer  iron infusion.  Patient is in satisfactory condition with no new complaints voiced.  Vital signs are stable. Patient took Claritin at 9am today prior to arrival. We will proceed with infusion per provider orders.

## 2022-07-20 LAB — KAPPA/LAMBDA LIGHT CHAINS
Kappa free light chain: 17.2 mg/L (ref 3.3–19.4)
Kappa, lambda light chain ratio: 2.87 — ABNORMAL HIGH (ref 0.26–1.65)
Lambda free light chains: 6 mg/L (ref 5.7–26.3)

## 2022-07-22 LAB — PROTEIN ELECTROPHORESIS, SERUM
A/G Ratio: 1.5 (ref 0.7–1.7)
Albumin ELP: 3 g/dL (ref 2.9–4.4)
Alpha-1-Globulin: 0.2 g/dL (ref 0.0–0.4)
Alpha-2-Globulin: 0.6 g/dL (ref 0.4–1.0)
Beta Globulin: 0.7 g/dL (ref 0.7–1.3)
Gamma Globulin: 0.4 g/dL (ref 0.4–1.8)
Globulin, Total: 2 g/dL — ABNORMAL LOW (ref 2.2–3.9)
M-Spike, %: 0.2 g/dL — ABNORMAL HIGH
Total Protein ELP: 5 g/dL — ABNORMAL LOW (ref 6.0–8.5)

## 2022-07-27 ENCOUNTER — Other Ambulatory Visit: Payer: Self-pay | Admitting: Hematology

## 2022-07-27 DIAGNOSIS — C9000 Multiple myeloma not having achieved remission: Secondary | ICD-10-CM

## 2022-07-29 DIAGNOSIS — Z6841 Body Mass Index (BMI) 40.0 and over, adult: Secondary | ICD-10-CM | POA: Diagnosis not present

## 2022-07-29 DIAGNOSIS — R030 Elevated blood-pressure reading, without diagnosis of hypertension: Secondary | ICD-10-CM | POA: Diagnosis not present

## 2022-07-29 DIAGNOSIS — M5136 Other intervertebral disc degeneration, lumbar region: Secondary | ICD-10-CM | POA: Diagnosis not present

## 2022-07-29 DIAGNOSIS — E119 Type 2 diabetes mellitus without complications: Secondary | ICD-10-CM | POA: Diagnosis not present

## 2022-07-29 DIAGNOSIS — Z79899 Other long term (current) drug therapy: Secondary | ICD-10-CM | POA: Diagnosis not present

## 2022-07-29 DIAGNOSIS — F1120 Opioid dependence, uncomplicated: Secondary | ICD-10-CM | POA: Diagnosis not present

## 2022-07-29 DIAGNOSIS — G894 Chronic pain syndrome: Secondary | ICD-10-CM | POA: Diagnosis not present

## 2022-07-29 DIAGNOSIS — C9000 Multiple myeloma not having achieved remission: Secondary | ICD-10-CM | POA: Diagnosis not present

## 2022-08-01 ENCOUNTER — Other Ambulatory Visit: Payer: Self-pay

## 2022-08-01 DIAGNOSIS — C9000 Multiple myeloma not having achieved remission: Secondary | ICD-10-CM

## 2022-08-01 MED ORDER — LENALIDOMIDE 15 MG PO CAPS
ORAL_CAPSULE | ORAL | 0 refills | Status: DC
Start: 2022-08-01 — End: 2022-08-24

## 2022-08-01 NOTE — Telephone Encounter (Signed)
Chart reviewed. Revlimid refilled per last office note with Dr. Katragadda.  

## 2022-08-02 ENCOUNTER — Inpatient Hospital Stay: Payer: Medicare PPO | Attending: Hematology

## 2022-08-02 ENCOUNTER — Inpatient Hospital Stay: Payer: Medicare PPO

## 2022-08-02 ENCOUNTER — Inpatient Hospital Stay (HOSPITAL_BASED_OUTPATIENT_CLINIC_OR_DEPARTMENT_OTHER): Payer: Medicare PPO | Admitting: Hematology

## 2022-08-02 VITALS — BP 103/65 | HR 76 | Temp 98.6°F | Resp 20

## 2022-08-02 VITALS — BP 102/52 | HR 62 | Temp 98.4°F | Resp 20 | Wt 294.1 lb

## 2022-08-02 DIAGNOSIS — Z7961 Long term (current) use of immunomodulator: Secondary | ICD-10-CM | POA: Insufficient documentation

## 2022-08-02 DIAGNOSIS — E1142 Type 2 diabetes mellitus with diabetic polyneuropathy: Secondary | ICD-10-CM | POA: Insufficient documentation

## 2022-08-02 DIAGNOSIS — Z87891 Personal history of nicotine dependence: Secondary | ICD-10-CM | POA: Insufficient documentation

## 2022-08-02 DIAGNOSIS — I10 Essential (primary) hypertension: Secondary | ICD-10-CM | POA: Insufficient documentation

## 2022-08-02 DIAGNOSIS — C9000 Multiple myeloma not having achieved remission: Secondary | ICD-10-CM | POA: Diagnosis not present

## 2022-08-02 DIAGNOSIS — Z8041 Family history of malignant neoplasm of ovary: Secondary | ICD-10-CM | POA: Insufficient documentation

## 2022-08-02 DIAGNOSIS — Z95828 Presence of other vascular implants and grafts: Secondary | ICD-10-CM

## 2022-08-02 DIAGNOSIS — D500 Iron deficiency anemia secondary to blood loss (chronic): Secondary | ICD-10-CM

## 2022-08-02 DIAGNOSIS — R2242 Localized swelling, mass and lump, left lower limb: Secondary | ICD-10-CM

## 2022-08-02 DIAGNOSIS — E785 Hyperlipidemia, unspecified: Secondary | ICD-10-CM | POA: Insufficient documentation

## 2022-08-02 DIAGNOSIS — Z85828 Personal history of other malignant neoplasm of skin: Secondary | ICD-10-CM | POA: Diagnosis not present

## 2022-08-02 DIAGNOSIS — K7460 Unspecified cirrhosis of liver: Secondary | ICD-10-CM | POA: Diagnosis not present

## 2022-08-02 DIAGNOSIS — K921 Melena: Secondary | ICD-10-CM | POA: Diagnosis not present

## 2022-08-02 DIAGNOSIS — Z87442 Personal history of urinary calculi: Secondary | ICD-10-CM | POA: Insufficient documentation

## 2022-08-02 DIAGNOSIS — Z7952 Long term (current) use of systemic steroids: Secondary | ICD-10-CM | POA: Diagnosis not present

## 2022-08-02 DIAGNOSIS — Z79624 Long term (current) use of inhibitors of nucleotide synthesis: Secondary | ICD-10-CM | POA: Insufficient documentation

## 2022-08-02 DIAGNOSIS — G629 Polyneuropathy, unspecified: Secondary | ICD-10-CM | POA: Insufficient documentation

## 2022-08-02 DIAGNOSIS — Z5112 Encounter for antineoplastic immunotherapy: Secondary | ICD-10-CM | POA: Diagnosis not present

## 2022-08-02 DIAGNOSIS — R5383 Other fatigue: Secondary | ICD-10-CM | POA: Diagnosis not present

## 2022-08-02 LAB — CBC WITH DIFFERENTIAL/PLATELET
Abs Immature Granulocytes: 0 10*3/uL (ref 0.00–0.07)
Basophils Absolute: 0 10*3/uL (ref 0.0–0.1)
Basophils Relative: 0 %
Eosinophils Absolute: 0.1 10*3/uL (ref 0.0–0.5)
Eosinophils Relative: 2 %
HCT: 26.7 % — ABNORMAL LOW (ref 36.0–46.0)
Hemoglobin: 8.1 g/dL — ABNORMAL LOW (ref 12.0–15.0)
Immature Granulocytes: 0 %
Lymphocytes Relative: 26 %
Lymphs Abs: 0.7 10*3/uL (ref 0.7–4.0)
MCH: 29.8 pg (ref 26.0–34.0)
MCHC: 30.3 g/dL (ref 30.0–36.0)
MCV: 98.2 fL (ref 80.0–100.0)
Monocytes Absolute: 0.3 10*3/uL (ref 0.1–1.0)
Monocytes Relative: 11 %
Neutro Abs: 1.7 10*3/uL (ref 1.7–7.7)
Neutrophils Relative %: 61 %
Platelets: 86 10*3/uL — ABNORMAL LOW (ref 150–400)
RBC: 2.72 MIL/uL — ABNORMAL LOW (ref 3.87–5.11)
RDW: 17 % — ABNORMAL HIGH (ref 11.5–15.5)
WBC: 2.7 10*3/uL — ABNORMAL LOW (ref 4.0–10.5)
nRBC: 0 % (ref 0.0–0.2)

## 2022-08-02 LAB — COMPREHENSIVE METABOLIC PANEL
ALT: 11 U/L (ref 0–44)
AST: 13 U/L — ABNORMAL LOW (ref 15–41)
Albumin: 3 g/dL — ABNORMAL LOW (ref 3.5–5.0)
Alkaline Phosphatase: 77 U/L (ref 38–126)
Anion gap: 8 (ref 5–15)
BUN: 15 mg/dL (ref 8–23)
CO2: 28 mmol/L (ref 22–32)
Calcium: 8 mg/dL — ABNORMAL LOW (ref 8.9–10.3)
Chloride: 101 mmol/L (ref 98–111)
Creatinine, Ser: 0.6 mg/dL (ref 0.44–1.00)
GFR, Estimated: >60 mL/min (ref >60)
Glucose, Bld: 153 mg/dL — ABNORMAL HIGH (ref 70–99)
Potassium: 3.6 mmol/L (ref 3.5–5.1)
Sodium: 137 mmol/L (ref 135–145)
Total Bilirubin: 0.4 mg/dL (ref 0.3–1.2)
Total Protein: 5.5 g/dL — ABNORMAL LOW (ref 6.5–8.1)

## 2022-08-02 LAB — IRON AND TIBC
Iron: 44 ug/dL (ref 28–170)
Saturation Ratios: 13 % (ref 10.4–31.8)
TIBC: 338 ug/dL (ref 250–450)
UIBC: 294 ug/dL

## 2022-08-02 LAB — PREPARE RBC (CROSSMATCH)

## 2022-08-02 LAB — MAGNESIUM: Magnesium: 1.9 mg/dL (ref 1.7–2.4)

## 2022-08-02 LAB — FERRITIN: Ferritin: 81 ng/mL (ref 11–307)

## 2022-08-02 LAB — TYPE AND SCREEN: Antibody Screen: POSITIVE

## 2022-08-02 MED ORDER — CYANOCOBALAMIN 1000 MCG/ML IJ SOLN
1000.0000 ug | Freq: Once | INTRAMUSCULAR | Status: AC
  Administered 2022-08-02: 1000 ug via INTRAMUSCULAR
  Filled 2022-08-02: qty 1

## 2022-08-02 MED ORDER — SODIUM CHLORIDE 0.9 % IV SOLN
500.0000 mg | Freq: Once | INTRAVENOUS | Status: AC
  Administered 2022-08-02: 500 mg via INTRAVENOUS
  Filled 2022-08-02: qty 5

## 2022-08-02 MED ORDER — HEPARIN SOD (PORK) LOCK FLUSH 100 UNIT/ML IV SOLN
500.0000 [IU] | Freq: Once | INTRAVENOUS | Status: AC
  Administered 2022-08-02: 500 [IU] via INTRAVENOUS

## 2022-08-02 MED ORDER — SODIUM CHLORIDE 0.9 % IV SOLN: Freq: Once | INTRAVENOUS | Status: AC

## 2022-08-02 MED ORDER — SODIUM CHLORIDE 0.9% FLUSH
10.0000 mL | Freq: Once | INTRAVENOUS | Status: AC
  Administered 2022-08-02: 10 mL via INTRAVENOUS

## 2022-08-02 MED ORDER — DARATUMUMAB-HYALURONIDASE-FIHJ 1800-30000 MG-UT/15ML ~~LOC~~ SOLN
1800.0000 mg | Freq: Once | SUBCUTANEOUS | Status: AC
  Administered 2022-08-02: 1800 mg via SUBCUTANEOUS
  Filled 2022-08-02: qty 15

## 2022-08-02 NOTE — Patient Instructions (Signed)
[facility]  Discharge Instructions: Thank you for choosing [facility] to provide your oncology and hematology care.  If you have a lab appointment with the Cancer Center - please note that after April 8th, 2024, all labs will be drawn in the cancer center.  You do not have to check in or register with the main entrance as you have in the past but will complete your check-in in the cancer center.  Wear comfortable clothing and clothing appropriate for easy access to any Portacath or PICC line.   We strive to give you quality time with your provider. You may need to reschedule your appointment if you arrive late (15 or more minutes).  Arriving late affects you and other patients whose appointments are after yours.  Also, if you miss three or more appointments without notifying the office, you may be dismissed from the clinic at the provider's discretion.      For prescription refill requests, have your pharmacy contact our office and allow 72 hours for refills to be completed.    Today you received the following chemotherapy and/or immunotherapy agents Dara Weston, Vit B12, and Venofer 500 mg    To help prevent nausea and vomiting after your treatment, we encourage you to take your nausea medication as directed.  BELOW ARE SYMPTOMS THAT SHOULD BE REPORTED IMMEDIATELY: *FEVER GREATER THAN 100.4 F (38 C) OR HIGHER *CHILLS OR SWEATING *NAUSEA AND VOMITING THAT IS NOT CONTROLLED WITH YOUR NAUSEA MEDICATION *UNUSUAL SHORTNESS OF BREATH *UNUSUAL BRUISING OR BLEEDING *URINARY PROBLEMS (pain or burning when urinating, or frequent urination) *BOWEL PROBLEMS (unusual diarrhea, constipation, pain near the anus) TENDERNESS IN MOUTH AND THROAT WITH OR WITHOUT PRESENCE OF ULCERS (sore throat, sores in mouth, or a toothache) UNUSUAL RASH, SWELLING OR PAIN  UNUSUAL VAGINAL DISCHARGE OR ITCHING   Items with * indicate a potential emergency and should be followed up as soon  as possible or go to the Emergency Department if any problems should occur.  Please show the CHEMOTHERAPY ALERT CARD or IMMUNOTHERAPY ALERT CARD at check-in to the Emergency Department and triage nurse.  Should you have questions after your visit or need to cancel or reschedule your appointment, please contact [facility] AT [facility] [phone]  and follow the prompts.  Office hours are 8:00 a.m. to 4:30 p.m. Monday - Friday. Please note that voicemails left after 4:00 p.m. may not be returned until the following business day.  We are closed weekends and major holidays. You have access to a nurse at all times for urgent questions. Please call the main number to the clinic [phone] and follow the prompts.  For any non-urgent questions, you may also contact your provider using MyChart. We now offer e-Visits for anyone 39 and older to request care online for non-urgent symptoms. For details visit [email or web address].   Also download the MyChart app! Go to the app store, search "MyChart", open the app, select [facility], and log in with your MyChart username and password.

## 2022-08-02 NOTE — Progress Notes (Signed)
One unit of blood ordered for tomorrow per Dr. Ellin Saba

## 2022-08-02 NOTE — Patient Instructions (Signed)
[facility] at [facility] Discharge Instructions   You were seen and examined today by Dr. Katragadda.  He reviewed the results of your lab work which are normal/stable.   We will proceed with your treatment today.  Return as scheduled.    Thank you for choosing [facility] at [facility] to provide your oncology and hematology care.  To afford each patient quality time with our provider, please arrive at least 15 minutes before your scheduled appointment time.   If you have a lab appointment with the Cancer Center please come in thru the Main Entrance and check in at the main information desk.  You need to re-schedule your appointment should you arrive 10 or more minutes late.  We strive to give you quality time with our providers, and arriving late affects you and other patients whose appointments are after yours.  Also, if you no show three or more times for appointments you may be dismissed from the clinic at the providers discretion.     Again, thank you for choosing [facility].  Our hope is that these requests will decrease the amount of time that you wait before being seen by our physicians.       _____________________________________________________________  Should you have questions after your visit to [facility], please contact our office at [phone] and follow the prompts.  Our office hours are 8:00 a.m. and 4:30 p.m. Monday - Friday.  Please note that voicemails left after 4:00 p.m. may not be returned until the following business day.  We are closed weekends and major holidays.  You do have access to a nurse 24-7, just call the main number to the clinic [phone] and do not press any options, hold on the line and a nurse will answer the phone.    For prescription refill requests, have your pharmacy contact our office and allow 72 hours.    Due to Covid, you will need to wear a mask upon entering  the hospital. If you do not have a mask, a mask will be given to you at the Main Entrance upon arrival. For doctor visits, patients may have 1 support person age 18 or older with them. For treatment visits, patients can not have anyone with them due to social distancing guidelines and our immunocompromised population.      

## 2022-08-02 NOTE — Progress Notes (Addendum)
[facility] [address], [place] [address]    Clinic Day:  08/02/2022  Referring physician: Raliegh Ip, DO  Patient Care Team: Raliegh Ip, DO as PCP - General (Family Medicine) Wyline Mood Dorothe Pea, MD as PCP - Cardiology (Cardiology) Letta Kocher, MD (Rehabilitation) Coralyn Helling, MD as Consulting Physician (Pulmonary Disease) Lanelle Bal, DO as Consulting Physician (Internal Medicine) Merryl Hacker, NP as Nurse Practitioner (Nurse Practitioner) Bonna Gains, MD as Referring Physician (Gastroenterology) Doreatha Massed, MD as Medical Oncologist (Medical Oncology) Therese Sarah, RN as Oncology Nurse Navigator (Medical Oncology)   ASSESSMENT & PLAN:   Assessment: 1.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  Orderly maturation of erythroid and myeloid series. - Myeloma FISH panel: t(11;14) - Cytogenetics: Failed to grow metaphases - PET scan (11/18/2021): No evidence of active myeloma or plasmacytoma.  Right upper lobe lung infection. - Skull x-rays (10/29/2021) multiple discrete round lucent/lytic lesions in the skull - Worsening M spike and free light chain ratio.  Normal creatinine and calcium.  Multifactorial anemia including blood loss and bone marrow infiltration. - Cycle 1 of DRd started on 12/14/2021 with lenalidomide 15 mg 3 weeks on/1 week off and dexamethasone 20 mg weekly   2.  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  Quit smoking 15 years ago.  No exposure to chemicals or pesticides. - Paternal grandmother had ovarian cancer.  Sister had ovarian cancer.   3.  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  Seen by transplant hepatology service at [facility].    Plan: 1.  Stage II IgG kappa myeloma, standard risk: - Reviewed myeloma  labs from 07/19/2022: M spike improved to 0.2.  Free light chain ratio improved to 2.87 from 3.01.  Kappa light chains are normal at 17.2. - Continue Revlimid 15 mg 3 weeks on/1 week off with dexamethasone 20 mg weekly.  Continue Darzalex every 4 weeks. - She complained of left leg swelling and pain for the past 3 days.  We will obtain left leg Doppler.   2.  Iron deficiency anemia from chronic GI bleed: - She reports that she is having black stools daily in the mornings.  She complains of feeling tired.  She has been receiving Venofer 500 mg every 2 weeks. - Hemoglobin is 8.1 today.  As she is feeling tired, we will give her 1 unit PRBC. - Ferritin is 81 with percent saturation is 13.  If the hemoglobin is not improving, we will consider switching Venofer to weekly.   3.  Infection prophylaxis: - Continue acyclovir 400 mg twice daily for shingles prophylaxis.  Aspirin is on hold due to GI bleed.   4.  Cirrhosis: - Continue Lasix 20 mg daily, carvedilol 6.5 mg daily and lactulose daily.   5.  Peripheral neuropathy: - She feels like walking on tiny pillows which is stable.    Orders Placed This Encounter  Procedures   US Venous Img Lower Unilateral Left    Standing Status:   Future    Standing Expiration Date:   08/02/2023    Order Specific Question:   Reason for Exam (SYMPTOM  OR DIAGNOSIS REQUIRED)    Answer:   LLE swelling/pain, new onset, r/o DVT    Order Specific Question:   Preferred imaging location?    Answer:   Pattricia Boss  [facility]   CBC with Differential    Standing Status:   Future    Standing Expiration Date:   10/25/2023   CBC with Differential    Standing Status:   Future    Standing Expiration Date:   09/27/2023   CBC with Differential    Standing Status:   Future    Standing Expiration Date:   11/22/2023   CBC with Differential    Standing Status:   Future    Standing Expiration Date:   12/20/2023      I,Katie Daubenspeck,acting as a scribe for Doreatha Massed,  MD.,have documented all relevant documentation on the behalf of Doreatha Massed, MD,as directed by  Doreatha Massed, MD while in the presence of Doreatha Massed, MD.   I, Doreatha Massed MD, have reviewed the above documentation for accuracy and completeness, and I agree with the above.   Doreatha Massed, MD   5/7/20246:11 PM  CHIEF COMPLAINT:   Diagnosis: stage II standard risk IgG kappa plasma cell myeloma    Cancer Staging  Multiple myeloma (HCC) Staging form: Plasma Cell Myeloma and Plasma Cell Disorders, AJCC 8th Edition - Clinical stage from 11/30/2021: RISS Stage II (Beta-2-microglobulin (mg/L): 2.8, Albumin (g/dL): 3, ISS: Stage II, High-risk cytogenetics: Absent, LDH: Normal) - Signed by Doreatha Massed, MD on 11/30/2021    Prior Therapy: none  Current Therapy:  Daratumumab, lenalidomide and dexamethasone    HISTORY OF PRESENT ILLNESS:   Oncology History  Multiple myeloma (HCC)  11/30/2021 Initial Diagnosis   Multiple myeloma (HCC)   11/30/2021 Cancer Staging   Staging form: Plasma Cell Myeloma and Plasma Cell Disorders, AJCC 8th Edition - Clinical stage from 11/30/2021: RISS Stage II (Beta-2-microglobulin (mg/L): 2.8, Albumin (g/dL): 3, ISS: Stage II, High-risk cytogenetics: Absent, LDH: Normal) - Signed by Doreatha Massed, MD on 11/30/2021 Histopathologic type: Multiple myeloma Stage prefix: Initial diagnosis Beta 2 microglobulin range (mg/L): Less than 3.5 Albumin range (g/dL): Less than 3.5 Cytogenetics: t(11;14) translocation Serum calcium level: Normal   12/14/2021 -  Chemotherapy   Patient is on Treatment Plan : MYELOMA  Daratumumab SQ + Lenalidomide + Dexamethasone (DaraRd) q28d        INTERVAL HISTORY:   Savannah Burns is a 72 y.o. female presenting to clinic today for follow up of stage II standard risk IgG kappa plasma cell myeloma. She was last seen by me on 06/21/22.  Today, she states that she is doing well overall. Her appetite level  is at 80%. Her energy level is at 40%.  PAST MEDICAL HISTORY:   Past Medical History: Past Medical History:  Diagnosis Date   Adrenal adenoma, left    Stable   Anxiety    Arthritis    bilateral hands   Depression    Diabetes mellitus, type 2 (HCC) 08/12/2008   Qualifier: Diagnosis of  By: Dayton Martes MD, Talia     Dyspnea    Esophageal varices (HCC)    Grade II diastolic dysfunction    History of kidney stones    Hyperlipidemia    Hypertension    Lower back pain    Lower GI bleed 03/19/2020   Panic attacks    Pneumonia    currently taking antibiotic and prednisone for early stages of pneumonia   Pulmonary nodules    bilateral   Skin cancer    face    Surgical History: Past Surgical History:  Procedure Laterality Date   BIOPSY  04/07/2020   Procedure: BIOPSY;  Surgeon: Lanelle Bal, DO;  [facility]: [facility];  Service: Endoscopy;;   Breast Cystectomy  Right    CESAREAN SECTION     COLONOSCOPY WITH PROPOFOL N/A 01/25/2020   Dr. Marletta Lor: Nonbleeding internal hemorrhoids, diverticulosis, 5 mm polyp removed from the ascending colon, 10 mm polyp removed from the sigmoid colon, 30 mm polyp (tubulovillous adenoma with no high-grade dysplasia) removed from the transverse colon via piecemeal status post tattoo.  Other polyps were tubular adenomas.  3 month surveillance colonoscopy recommended.   COLONOSCOPY WITH PROPOFOL N/A 04/07/2020   Procedure: COLONOSCOPY WITH PROPOFOL;  Surgeon: Lanelle Bal, DO;  Location: [facility];  Service: Endoscopy;  Laterality: N/A;  3:00pm, pt knows new time per office   CYSTOSCOPY/URETEROSCOPY/HOLMIUM LASER/STENT PLACEMENT Bilateral 03/01/2019   Procedure: CYSTOSCOPY/RETROGRADEURETEROSCOPY/HOLMIUM LASER/STENT PLACEMENT;  Surgeon: Rene Paci, MD;  Location: [facility];  Service: Urology;  Laterality: Bilateral;  ONLY NEEDS 60 MIN   ESOPHAGOGASTRODUODENOSCOPY (EGD) WITH PROPOFOL N/A 01/25/2020   Dr. Marletta Lor: 4 columns grade 1 esophageal  varices   ESOPHAGOGASTRODUODENOSCOPY (EGD) WITH PROPOFOL N/A 05/18/2020   Procedure: ESOPHAGOGASTRODUODENOSCOPY (EGD) WITH PROPOFOL;  Surgeon: Lanelle Bal, DO;  [facility];  Service: Endoscopy;  Laterality: N/A;   ESOPHAGOGASTRODUODENOSCOPY (EGD) WITH PROPOFOL N/A 07/28/2020   Procedure: ESOPHAGOGASTRODUODENOSCOPY (EGD) WITH PROPOFOL;  Surgeon: Lanelle Bal, DO;  [facility]: [facility];  Service: Endoscopy;  Laterality: N/A;  am or early PM due to givens capsule placement   GIVENS CAPSULE STUDY N/A 05/18/2020   Procedure: GIVENS CAPSULE STUDY;  Surgeon: Dolores Frame, MD;  [facility]: [facility];  Service: Gastroenterology;  Laterality: N/A;   GIVENS CAPSULE STUDY N/A 07/28/2020   Procedure: GIVENS CAPSULE STUDY;  Surgeon: Lanelle Bal, DO;  [facility];  Service: Endoscopy;  Laterality: N/A;   IR IMAGING GUIDED PORT INSERTION  12/02/2021   POLYPECTOMY  01/25/2020   Procedure: POLYPECTOMY;  Surgeon: Lanelle Bal, DO;  [facility];  Service: Endoscopy;;   POLYPECTOMY  04/07/2020   Procedure: POLYPECTOMY INTESTINAL;  Surgeon: Lanelle Bal, DO;  [facility];  Service: Endoscopy;;   SKIN CANCER EXCISION     Face   SPINE SURGERY     SUBMUCOSAL TATTOO INJECTION  01/25/2020   Procedure: SUBMUCOSAL TATTOO INJECTION;  Surgeon: Lanelle Bal, DO;  [facility]: [facility];  Service: Endoscopy;;    Social History: Social History   Socioeconomic History   Marital status: Widowed    Spouse name: Not on file   Number of children: 2   Years of education: 14   Highest education level: Not on file  Occupational History   Occupation: Retired   Tobacco Use   Smoking status: Former    Packs/day: 1.50    Years: 40.00    Additional pack years: 0.00    Total pack years: 60.00    Types: Cigarettes    Quit date: 04/29/2015    Years since quitting: 7.2   Smokeless tobacco: Never   Tobacco comments:    Quit smoking  04/2015- Previous 1.5 ppd smoker  Vaping Use   Vaping Use: Never used  Substance and Sexual Activity   Alcohol use: No    Alcohol/week: 0.0 standard drinks of alcohol   Drug use: No   Sexual activity: Not Currently    Birth control/protection: Post-menopausal  Other Topics Concern   Not on file  Social History Narrative   Her 73 year old granddaughter lives with her - one daughter lives  nearby, but she doesn't have a good relationship with her. Has a great relationship with other daughter who lives 1.5 hrs away - talks to her daily on the phone.   Social Determinants of Health   Financial Resource Strain: Medium Risk (07/27/2021)   Overall Financial Resource Strain (CARDIA)    Difficulty of Paying Living Expenses: Somewhat hard  Food Insecurity: Food Insecurity Present (07/27/2021)   Hunger Vital Sign    Worried About Running Out of Food in the Last Year: Sometimes true    Ran Out of Food in the Last Year: Never true  Transportation Needs: No Transportation Needs (07/27/2021)   PRAPARE - Administrator, Civil Service (Medical): No    Lack of Transportation (Non-Medical): No  Physical Activity: Inactive (07/27/2021)   Exercise Vital Sign    Days of Exercise per Week: 0 days    Minutes of Exercise per Session: 0 min  Stress: Stress Concern Present (07/27/2021)   [employer] of Occupational Health - Occupational Stress Questionnaire    Feeling of Stress : To some extent  Social Connections: Socially Isolated (07/27/2021)   Social Connection and Isolation Panel [NHANES]    Frequency of Communication with Friends and Family: More than three times a week    Frequency of Social Gatherings with Friends and Family: Once a week    Attends Religious Services: Never    Database administrator or Organizations: No    Attends Banker Meetings: Never    Marital Status: Widowed  Intimate Partner Violence: Not At Risk (07/27/2021)   Humiliation, Afraid, Rape, and Kick  questionnaire    Fear of Current or Ex-Partner: No    Emotionally Abused: No    Physically Abused: No    Sexually Abused: No    Family History: Family History  Problem Relation Age of Onset   Diabetes Father    Heart disease Father 69       MI   Hypertension Father    Anemia Mother        Transfusion dependent   COPD Sister    Cancer Paternal Grandmother 47       Pancreatic    Current Medications:  Current Outpatient Medications:    acyclovir (ZOVIRAX) 400 MG tablet, Take 1 tablet (400 mg total) by mouth 2 (two) times daily., Disp: 60 tablet, Rfl: 6   Alpha-Lipoic Acid 600 MG CAPS, Take 1 capsule (600 mg total) by mouth daily. For neuropathy, Disp: 100 capsule, Rfl: 3   bacitracin ointment, Apply 1 Application topically 2 (two) times daily., Disp: 120 g, Rfl: 0   baclofen (LIORESAL) 10 MG tablet, Take 10 mg by mouth 2 (two) times daily., Disp: , Rfl:    Blood Glucose Calibration (TRUE METRIX LEVEL 1) Low SOLN, Use w/ glucose monitor Dx E11.9, Disp: 3 each, Rfl: 0   Blood Glucose Monitoring Suppl (TRUE METRIX AIR GLUCOSE METER) w/Device KIT, Check BS daily Dx E11.9, Disp: 1 kit, Rfl: 0   carvedilol (COREG) 6.25 MG tablet, Take 6.25 mg by mouth 2 (two) times daily., Disp: , Rfl:    Cholecalciferol (VITAMIN D) 50 MCG (2000 UT) tablet, Take 2,000 Units by mouth daily., Disp: , Rfl:    clobetasol (TEMOVATE) 0.05 % external solution, Apply topically., Disp: , Rfl:    CONSTULOSE 10 GM/15ML solution, Take by mouth., Disp: , Rfl:    Cyanocobalamin (B-12 COMPLIANCE INJECTION) 1000 MCG/ML KIT, Inject 1,000 mcg as directed every 30 (thirty) days., Disp: , Rfl:  daratumumab-hyaluronidase-fihj (DARZALEX FASPRO) 1800-30000 MG-UT/15ML SOLN, Inject 1,800 mg into the skin once a week., Disp: , Rfl:    desvenlafaxine (PRISTIQ) 100 MG 24 hr tablet, Take 1 tablet (100 mg total) by mouth daily., Disp: 90 tablet, Rfl: 3   dexamethasone (DECADRON) 4 MG tablet, Take 5 tablets (20 mg total) by mouth  once a week., Disp: 20 tablet, Rfl: 3   esomeprazole (NEXIUM) 20 MG capsule, Take 1 capsule (20 mg total) by mouth daily at 12 noon., Disp: 90 capsule, Rfl: 3   fluticasone (CUTIVATE) 0.05 % cream, Apply 1 Application topically daily., Disp: , Rfl:    furosemide (LASIX) 20 MG tablet, Take 20 mg by mouth. bid, Disp: , Rfl:    glucose blood (TRUE METRIX BLOOD GLUCOSE TEST) test strip, Check BS daily Dx E11.9, Disp: 100 each, Rfl: 3   lenalidomide (REVLIMID) 15 MG capsule, TAKE 1 CAPSULE BY MOUTH EVERY DAY FOR 21 DAYS ON THEN 7 DAYS OFF, Disp: 21 capsule, Rfl: 0   lidocaine-prilocaine (EMLA) cream, Apply 1 Application topically as needed. Apply a small amount to port a cath site and cover with plastic wrap 1 hour prior to infusion appointments, Disp: 30 g, Rfl: 3   lovastatin (MEVACOR) 20 MG tablet, Take 1 tablet (20 mg total) by mouth at bedtime., Disp: 90 tablet, Rfl: 3   omeprazole (PRILOSEC) 20 MG capsule, Take 20 mg by mouth 2 (two) times daily., Disp: , Rfl:    oxyCODONE-acetaminophen (PERCOCET) 10-325 MG tablet, Take 1 tablet by mouth every 6 (six) hours as needed for pain., Disp: , Rfl:    OXYGEN, Inhale 3 L into the lungs continuous., Disp: , Rfl:    OZEMPIC, 0.25 OR 0.5 MG/DOSE, 2 MG/3ML SOPN, Inject into the skin., Disp: , Rfl:    potassium chloride SA (KLOR-CON) 20 MEQ tablet, Take 2 tablets (40 mEq total) by mouth 2 (two) times daily. (Patient taking differently: Take 40 mEq by mouth daily.), Disp: 360 tablet, Rfl: 3   prochlorperazine (COMPAZINE) 10 MG tablet, Take 1 tablet (10 mg total) by mouth every 6 (six) hours as needed for nausea or vomiting., Disp: 30 tablet, Rfl: 6   TRUEplus Lancets 33G MISC, Check BS daily Dx E11.9, Disp: 100 each, Rfl: 3   ferrous sulfate 325 (65 FE) MG tablet, Take 1 tablet (325 mg total) by mouth daily with breakfast., Disp: 30 tablet, Rfl: 3   torsemide (DEMADEX) 20 MG tablet, Take 3 tablets (60 mg total) by mouth 2 (two) times daily. (Patient taking  differently: Take 50 mg by mouth 2 (two) times daily.), Disp: 180 tablet, Rfl: 11 No current facility-administered medications for this visit.  Facility-Administered Medications Ordered in Other Visits:    sodium chloride flush (NS) 0.9 % injection 10 mL, 10 mL, Intravenous, PRN, Pennington, Rebekah M, PA-C, 10 mL at 03/08/22 1344   Allergies: Allergies  Allergen Reactions   Keflex [Cephalexin] Nausea And Vomiting    REVIEW OF SYSTEMS:   Review of Systems  Constitutional:  Negative for chills, fatigue and fever.  HENT:   Negative for lump/mass, mouth sores, nosebleeds, sore throat and trouble swallowing.   Eyes:  Negative for eye problems.  Respiratory:  Positive for shortness of breath. Negative for cough.   Cardiovascular:  Positive for leg swelling. Negative for chest pain and palpitations.  Gastrointestinal:  Negative for abdominal pain, constipation, diarrhea, nausea and vomiting.  Genitourinary:  Negative for bladder incontinence, difficulty urinating, dysuria, frequency, hematuria and nocturia.   Musculoskeletal:  Negative  for arthralgias, back pain, flank pain, myalgias and neck pain.  Skin:  Negative for itching and rash.  Neurological:  Positive for numbness. Negative for dizziness and headaches.  Hematological:  Does not bruise/bleed easily.  Psychiatric/Behavioral:  Positive for depression. Negative for sleep disturbance and suicidal ideas. The patient is nervous/anxious.   All other systems reviewed and are negative.    VITALS:   There were no vitals taken for this visit.  Wt Readings from Last 3 Encounters:  08/02/22 294 lb 1.5 oz (133.4 kg)  06/21/22 295 lb 3.1 oz (133.9 kg)  06/08/22 293 lb (132.9 kg)    There is no height or weight on file to calculate BMI.  Performance status (ECOG): 1 - Symptomatic but completely ambulatory  PHYSICAL EXAM:   Physical Exam Vitals and nursing note reviewed. Exam conducted with a chaperone present.  Constitutional:       Appearance: Normal appearance.  Cardiovascular:     Rate and Rhythm: Normal rate and regular rhythm.     Pulses: Normal pulses.     Heart sounds: Normal heart sounds.  Pulmonary:     Effort: Pulmonary effort is normal.     Breath sounds: Normal breath sounds.  Abdominal:     Palpations: Abdomen is soft. There is no hepatomegaly, splenomegaly or mass.     Tenderness: There is no abdominal tenderness.  Musculoskeletal:     Right lower leg: No edema.     Left lower leg: No edema.  Lymphadenopathy:     Cervical: No cervical adenopathy.     Right cervical: No superficial, deep or posterior cervical adenopathy.    Left cervical: No superficial, deep or posterior cervical adenopathy.     Upper Body:     Right upper body: No supraclavicular or axillary adenopathy.     Left upper body: No supraclavicular or axillary adenopathy.  Neurological:     General: No focal deficit present.     Mental Status: She is alert and oriented to person, place, and time.  Psychiatric:        Mood and Affect: Mood normal.        Behavior: Behavior normal.     LABS:      Latest Ref Rng & Units 08/02/2022    8:16 AM 07/19/2022   10:04 AM 07/05/2022    8:19 AM  CBC  WBC 4.0 - 10.5 K/uL 2.7  2.2  1.9   Hemoglobin 12.0 - 15.0 g/dL 8.1  8.2  8.8   Hematocrit 36.0 - 46.0 % 26.7  27.9  29.6   Platelets 150 - 400 K/uL 86  105  89       Latest Ref Rng & Units 08/02/2022    8:16 AM 06/07/2022    8:08 AM 05/10/2022    8:19 AM  CMP  Glucose 70 - 99 mg/dL 301  601  093   BUN 8 - 23 mg/dL 15  14  13    Creatinine 0.44 - 1.00 mg/dL 2.35  5.73  2.20   Sodium 135 - 145 mmol/L 137  135  138   Potassium 3.5 - 5.1 mmol/L 3.6  3.9  3.6   Chloride 98 - 111 mmol/L 101  105  103   CO2 22 - 32 mmol/L 28  27  28    Calcium 8.9 - 10.3 mg/dL 8.0  7.9  8.6   Total Protein 6.5 - 8.1 g/dL 5.5  5.8  5.6   Total Bilirubin 0.3 - 1.2 mg/dL 0.4  0.5  0.5   Alkaline Phos 38 - 126 U/L 77  82  85   AST 15 - 41 U/L 13  20  18    ALT 0 -  44 U/L 11  12  16       No results found for: "CEA1", "CEA" / No results found for: "CEA1", "CEA" No results found for: "PSA1" No results found for: "ZHY865" No results found for: "CAN125"  Lab Results  Component Value Date   TOTALPROTELP 5.0 (L) 07/19/2022   ALBUMINELP 3.0 07/19/2022   A1GS 0.2 07/19/2022   A2GS 0.6 07/19/2022   BETS 0.7 07/19/2022   GAMS 0.4 07/19/2022   MSPIKE 0.2 (H) 07/19/2022   SPEI Comment 07/19/2022   Lab Results  Component Value Date   TIBC 338 08/02/2022   TIBC 375 06/07/2022   TIBC 425 03/31/2022   FERRITIN 81 08/02/2022   FERRITIN 51 06/07/2022   FERRITIN 66 03/31/2022   IRONPCTSAT 13 08/02/2022   IRONPCTSAT 27 06/07/2022   IRONPCTSAT 9 (L) 03/31/2022   Lab Results  Component Value Date   LDH 92 (L) 10/22/2021   LDH 101 06/25/2021   LDH 103 03/24/2021     STUDIES:   No results found.

## 2022-08-02 NOTE — Progress Notes (Signed)
Patient presents today for Dara [place] and Venofer IV iron infusion per provider's order. Vital signs and other labs WNL for treatment today. Pt's platelets are 86 and pt c/o fatigue and blood in her stool. Dr.K made aware. Okay to proceed with treatment today per Dr.K. Pt's hemoglobin is 8.1 today, pt requested 1 unit of blood. Dr.K okayed for pt to received one unit of blood tomorrow. Pt made aware and verbalized understanding.  Pt took pre-meds at home prior to arrival.  [facility], Venofer 500mg , and Vit B12 injection given today per MD orders. Tolerated infusion without adverse affects. Vital signs stable. No complaints at this time. Discharged from clinic via wheelchair in stable condition. Alert and oriented x 3. F/U with [facility] as scheduled.

## 2022-08-02 NOTE — Progress Notes (Signed)
Patient is taking Revlimid as prescribed.  She has not missed any doses and reports no side effects at this time.  ? ?

## 2022-08-02 NOTE — Progress Notes (Signed)
Patient's port flushed easily without difficulty. Good blood return noted and no bruising or swelling noted at the site. Pt remained accessed for treatment today.

## 2022-08-03 ENCOUNTER — Inpatient Hospital Stay: Payer: Medicare PPO

## 2022-08-03 ENCOUNTER — Ambulatory Visit (HOSPITAL_COMMUNITY): Admission: RE | Admit: 2022-08-03 | Payer: Medicare PPO | Source: Ambulatory Visit

## 2022-08-03 DIAGNOSIS — K7460 Unspecified cirrhosis of liver: Secondary | ICD-10-CM | POA: Diagnosis not present

## 2022-08-03 DIAGNOSIS — E785 Hyperlipidemia, unspecified: Secondary | ICD-10-CM | POA: Diagnosis not present

## 2022-08-03 DIAGNOSIS — D500 Iron deficiency anemia secondary to blood loss (chronic): Secondary | ICD-10-CM

## 2022-08-03 DIAGNOSIS — Z5112 Encounter for antineoplastic immunotherapy: Secondary | ICD-10-CM | POA: Diagnosis not present

## 2022-08-03 DIAGNOSIS — E1142 Type 2 diabetes mellitus with diabetic polyneuropathy: Secondary | ICD-10-CM | POA: Diagnosis not present

## 2022-08-03 DIAGNOSIS — R5383 Other fatigue: Secondary | ICD-10-CM | POA: Diagnosis not present

## 2022-08-03 DIAGNOSIS — I10 Essential (primary) hypertension: Secondary | ICD-10-CM | POA: Diagnosis not present

## 2022-08-03 DIAGNOSIS — K921 Melena: Secondary | ICD-10-CM | POA: Diagnosis not present

## 2022-08-03 DIAGNOSIS — C9000 Multiple myeloma not having achieved remission: Secondary | ICD-10-CM | POA: Diagnosis not present

## 2022-08-03 LAB — BPAM RBC
Blood Product Expiration Date: 202406142359
Unit Type and Rh: 6200

## 2022-08-03 LAB — TYPE AND SCREEN

## 2022-08-03 MED ORDER — SODIUM CHLORIDE 0.9% IV SOLUTION
250.0000 mL | Freq: Once | INTRAVENOUS | Status: AC
  Administered 2022-08-03: 250 mL via INTRAVENOUS

## 2022-08-03 MED ORDER — SODIUM CHLORIDE 0.9% FLUSH
10.0000 mL | INTRAVENOUS | Status: AC | PRN
  Administered 2022-08-03: 10 mL

## 2022-08-03 MED ORDER — HEPARIN SOD (PORK) LOCK FLUSH 100 UNIT/ML IV SOLN
500.0000 [IU] | Freq: Once | INTRAVENOUS | Status: AC
  Administered 2022-08-03: 500 [IU] via INTRAVENOUS

## 2022-08-03 MED ORDER — HEPARIN SOD (PORK) LOCK FLUSH 100 UNIT/ML IV SOLN: 250.0000 [IU] | INTRAVENOUS | Status: DC | PRN

## 2022-08-03 NOTE — Progress Notes (Signed)
1 unit of blood given today per MD orders. Tolerated infusion without adverse affects. Vital signs stable. No complaints at this time. Discharged from clinic by wheel chair in stable condition. Alert and oriented x 3. F/U with [facility] as scheduled.

## 2022-08-03 NOTE — Progress Notes (Signed)
Patient took Tylenol and Benadryl from home at 1000.  No complaints voiced with no s/s of distress noted.

## 2022-08-03 NOTE — Patient Instructions (Addendum)
[facility]  Discharge Instructions: Thank you for choosing [facility] to provide your oncology and hematology care.  If you have a lab appointment with the Cancer Center - please note that after April 8th, 2024, all labs will be drawn in the cancer center.  You do not have to check in or register with the main entrance as you have in the past but will complete your check-in in the cancer center.  Wear comfortable clothing and clothing appropriate for easy access to any Portacath or PICC line.   We strive to give you quality time with your provider. You may need to reschedule your appointment if you arrive late (15 or more minutes).  Arriving late affects you and other patients whose appointments are after yours.  Also, if you miss three or more appointments without notifying the office, you may be dismissed from the clinic at the provider's discretion.      For prescription refill requests, have your pharmacy contact our office and allow 72 hours for refills to be completed.    Today you received the following : blood products       To help prevent nausea and vomiting after your treatment, we encourage you to take your nausea medication as directed.  BELOW ARE SYMPTOMS THAT SHOULD BE REPORTED IMMEDIATELY: *FEVER GREATER THAN 100.4 F (38 C) OR HIGHER *CHILLS OR SWEATING *NAUSEA AND VOMITING THAT IS NOT CONTROLLED WITH YOUR NAUSEA MEDICATION *UNUSUAL SHORTNESS OF BREATH *UNUSUAL BRUISING OR BLEEDING *URINARY PROBLEMS (pain or burning when urinating, or frequent urination) *BOWEL PROBLEMS (unusual diarrhea, constipation, pain near the anus) TENDERNESS IN MOUTH AND THROAT WITH OR WITHOUT PRESENCE OF ULCERS (sore throat, sores in mouth, or a toothache) UNUSUAL RASH, SWELLING OR PAIN  UNUSUAL VAGINAL DISCHARGE OR ITCHING   Items with * indicate a potential emergency and should be followed up as soon as possible or go to the Emergency Department if any  problems should occur.  Please show the CHEMOTHERAPY ALERT CARD or IMMUNOTHERAPY ALERT CARD at check-in to the Emergency Department and triage nurse.  Should you have questions after your visit or need to cancel or reschedule your appointment, please contact [facility] [phone]  and follow the prompts.  Office hours are 8:00 a.m. to 4:30 p.m. Monday - Friday. Please note that voicemails left after 4:00 p.m. may not be returned until the following business day.  We are closed weekends and major holidays. You have access to a nurse at all times for urgent questions. Please call the main number to the clinic [phone] and follow the prompts.  For any non-urgent questions, you may also contact your provider using MyChart. We now offer e-Visits for anyone 11 and older to request care online for non-urgent symptoms. For details visit [email or web address].   Also download the MyChart app! Go to the app store, search "MyChart", open the app, select , and log in with your MyChart username and password.  Blood Transfusion, Adult, Care After The following information offers guidance on how to care for yourself after your procedure. Your health care provider may also give you more specific instructions. If you have problems or questions, contact your health care provider. What can I expect after the procedure? After the procedure, it is common to have: Bruising and soreness where the IV was inserted. A headache. Follow these instructions at home: IV insertion site care     Follow instructions from your health care provider about  how to take care of your IV insertion site. Make sure you: Wash your hands with soap and water for at least 20 seconds before and after you change your bandage (dressing). If soap and water are not available, use hand sanitizer. Change your dressing as told by your health care provider. Check your IV insertion site every day for signs of  infection. Check for: Redness, swelling, or pain. Bleeding from the site. Warmth. Pus or a bad smell. General instructions Take over-the-counter and prescription medicines only as told by your health care provider. Rest as told by your health care provider. Return to your normal activities as told by your health care provider. Keep all follow-up visits. Lab tests may need to be done at certain periods to recheck your blood counts. Contact a health care provider if: You have itching or red, swollen areas of skin (hives). You have a fever or chills. You have pain in the head, back, or chest. You feel anxious or you feel weak after doing your normal activities. You have redness, swelling, warmth, or pain around the IV insertion site. You have blood coming from the IV insertion site that does not stop with pressure. You have pus or a bad smell coming from your IV insertion site. If you received your blood transfusion in an outpatient setting, you will be told whom to contact to report any reactions. Get help right away if: You have symptoms of a serious allergic or immune system reaction, including: Trouble breathing or shortness of breath. Swelling of the face, feeling flushed, or widespread rash. Dark urine or blood in the urine. Fast heartbeat. These symptoms may be an emergency. Get help right away. Call 911. Do not wait to see if the symptoms will go away. Do not drive yourself to the hospital. Summary Bruising and soreness around the IV insertion site are common. Check your IV insertion site every day for signs of infection. Rest as told by your health care provider. Return to your normal activities as told by your health care provider. Get help right away for symptoms of a serious allergic or immune system reaction to the blood transfusion. This information is not intended to replace advice given to you by your health care provider. Make sure you discuss any questions you have  with your health care provider. Document Revised: 06/11/2021 Document Reviewed: 06/11/2021 Elsevier Patient Education  2023 [employer].

## 2022-08-04 LAB — TYPE AND SCREEN
ABO/RH(D): A POS
Donor AG Type: NEGATIVE
Unit division: 0

## 2022-08-04 LAB — BPAM RBC: ISSUE DATE / TIME: 202405081042

## 2022-08-09 ENCOUNTER — Telehealth: Payer: Self-pay | Admitting: Family Medicine

## 2022-08-09 NOTE — Telephone Encounter (Signed)
Called patient to schedule Medicare Annual Wellness Visit (AWV). Left message for patient to call back and schedule Medicare Annual Wellness Visit (AWV).  Last date of AWV: 07/27/2021   Please schedule an appointment at any time with Vernona Rieger, [facility]. .  If any questions, please contact me at [phone].  Thank you,  Judeth Cornfield,  AMB Clinical Support [facility] AWV Program Direct Dial ??[phone]

## 2022-08-16 ENCOUNTER — Inpatient Hospital Stay: Payer: Medicare PPO

## 2022-08-16 VITALS — BP 108/50 | HR 60 | Temp 97.8°F | Resp 20

## 2022-08-16 DIAGNOSIS — D500 Iron deficiency anemia secondary to blood loss (chronic): Secondary | ICD-10-CM

## 2022-08-16 DIAGNOSIS — C9000 Multiple myeloma not having achieved remission: Secondary | ICD-10-CM | POA: Diagnosis not present

## 2022-08-16 DIAGNOSIS — E1142 Type 2 diabetes mellitus with diabetic polyneuropathy: Secondary | ICD-10-CM | POA: Diagnosis not present

## 2022-08-16 DIAGNOSIS — R5383 Other fatigue: Secondary | ICD-10-CM | POA: Diagnosis not present

## 2022-08-16 DIAGNOSIS — E785 Hyperlipidemia, unspecified: Secondary | ICD-10-CM | POA: Diagnosis not present

## 2022-08-16 DIAGNOSIS — Z5112 Encounter for antineoplastic immunotherapy: Secondary | ICD-10-CM | POA: Diagnosis not present

## 2022-08-16 DIAGNOSIS — K921 Melena: Secondary | ICD-10-CM | POA: Diagnosis not present

## 2022-08-16 DIAGNOSIS — K7460 Unspecified cirrhosis of liver: Secondary | ICD-10-CM | POA: Diagnosis not present

## 2022-08-16 DIAGNOSIS — Z95828 Presence of other vascular implants and grafts: Secondary | ICD-10-CM

## 2022-08-16 DIAGNOSIS — I10 Essential (primary) hypertension: Secondary | ICD-10-CM | POA: Diagnosis not present

## 2022-08-16 MED ORDER — SODIUM CHLORIDE 0.9 % IV SOLN
500.0000 mg | Freq: Once | INTRAVENOUS | Status: AC
  Administered 2022-08-16: 500 mg via INTRAVENOUS
  Filled 2022-08-16: qty 20

## 2022-08-16 MED ORDER — SODIUM CHLORIDE 0.9% FLUSH
10.0000 mL | INTRAVENOUS | Status: DC | PRN
  Administered 2022-08-16: 10 mL via INTRAVENOUS

## 2022-08-16 MED ORDER — HEPARIN SOD (PORK) LOCK FLUSH 100 UNIT/ML IV SOLN
500.0000 [IU] | Freq: Once | INTRAVENOUS | Status: AC
  Administered 2022-08-16: 500 [IU] via INTRAVENOUS

## 2022-08-16 MED ORDER — SODIUM CHLORIDE 0.9 % IV SOLN: Freq: Once | INTRAVENOUS | Status: AC

## 2022-08-16 NOTE — Patient Instructions (Signed)
[facility] [place]  Discharge Instructions: Thank you for choosing [facility] to provide your oncology and hematology care.  If you have a lab appointment with the Cancer Center - please note that after April 8th, 2024, all labs will be drawn in the cancer center.  You do not have to check in or register with the main entrance as you have in the past but will complete your check-in in the cancer center.  Wear comfortable clothing and clothing appropriate for easy access to any Portacath or PICC line.   We strive to give you quality time with your provider. You may need to reschedule your appointment if you arrive late (15 or more minutes).  Arriving late affects you and other patients whose appointments are after yours.  Also, if you miss three or more appointments without notifying the office, you may be dismissed from the clinic at the provider's discretion.      For prescription refill requests, have your pharmacy contact our office and allow 72 hours for refills to be completed.    Today you received the following chemotherapy and/or immunotherapy agents venofer      To help prevent nausea and vomiting after your treatment, we encourage you to take your nausea medication as directed.  BELOW ARE SYMPTOMS THAT SHOULD BE REPORTED IMMEDIATELY: *FEVER GREATER THAN 100.4 F (38 C) OR HIGHER *CHILLS OR SWEATING *NAUSEA AND VOMITING THAT IS NOT CONTROLLED WITH YOUR NAUSEA MEDICATION *UNUSUAL SHORTNESS OF BREATH *UNUSUAL BRUISING OR BLEEDING *URINARY PROBLEMS (pain or burning when urinating, or frequent urination) *BOWEL PROBLEMS (unusual diarrhea, constipation, pain near the anus) TENDERNESS IN MOUTH AND THROAT WITH OR WITHOUT PRESENCE OF ULCERS (sore throat, sores in mouth, or a toothache) UNUSUAL RASH, SWELLING OR PAIN  UNUSUAL VAGINAL DISCHARGE OR ITCHING   Items with * indicate a potential emergency and should be followed up as soon as possible or go to the  Emergency Department if any problems should occur.  Please show the CHEMOTHERAPY ALERT CARD or IMMUNOTHERAPY ALERT CARD at check-in to the Emergency Department and triage nurse.  Should you have questions after your visit or need to cancel or reschedule your appointment, please contact [facility] [place] [phone]  and follow the prompts.  Office hours are 8:00 a.m. to 4:30 p.m. Monday - Friday. Please note that voicemails left after 4:00 p.m. may not be returned until the following business day.  We are closed weekends and major holidays. You have access to a nurse at all times for urgent questions. Please call the main number to the clinic [phone] and follow the prompts.  For any non-urgent questions, you may also contact your provider using MyChart. We now offer e-Visits for anyone 18 and older to request care online for non-urgent symptoms. For details visit [email or web address].   Also download the MyChart app! Go to the app store, search "MyChart", open the app, select [place], and log in with your MyChart username and password.   

## 2022-08-16 NOTE — Progress Notes (Signed)
Patient presents today for Venofer infusion per providers order.  Vital signs WNL.  Patient has no new complaints at this time.  Stable during infusion without adverse affects.  Vital signs stable.  No complaints at this time.  Discharge from clinic via wheelchair in stable condition.  Alert and oriented X 3.  Follow up with [facility] as scheduled.

## 2022-08-21 IMAGING — DX DG BONE SURVEY MET
8 of 10 series · 8 of 10 positions shown · non-contrast
Comparison: PET CT 08/03/2020. Bone survey 07/16/2020. Ultrasound
01/25/2020.

CLINICAL DATA: History of myeloma.

EXAM:
METASTATIC BONE SURVEY

[skull lat]
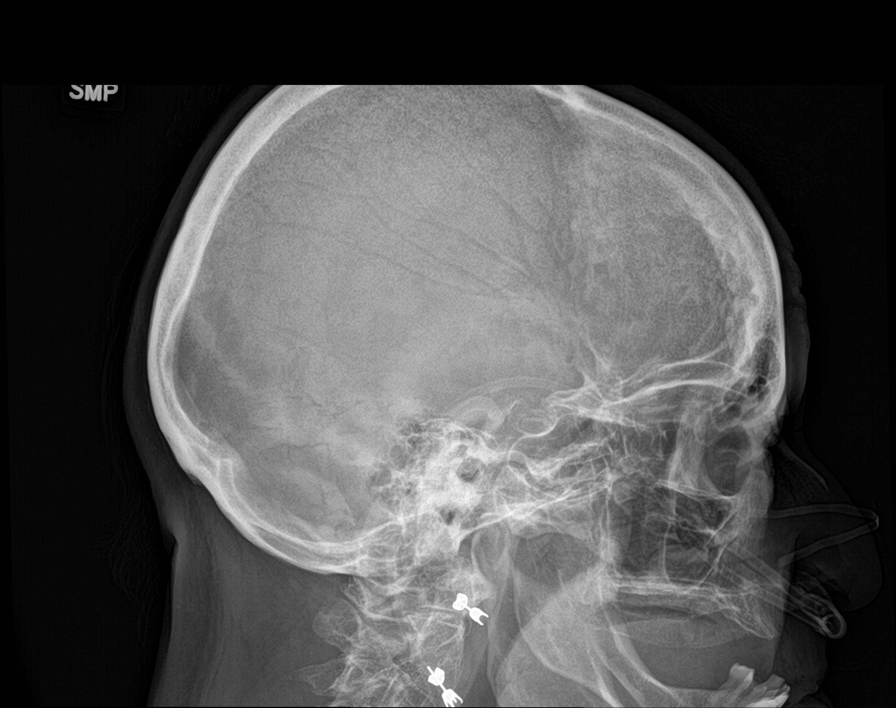

[shoulder ap (1 of 2)]
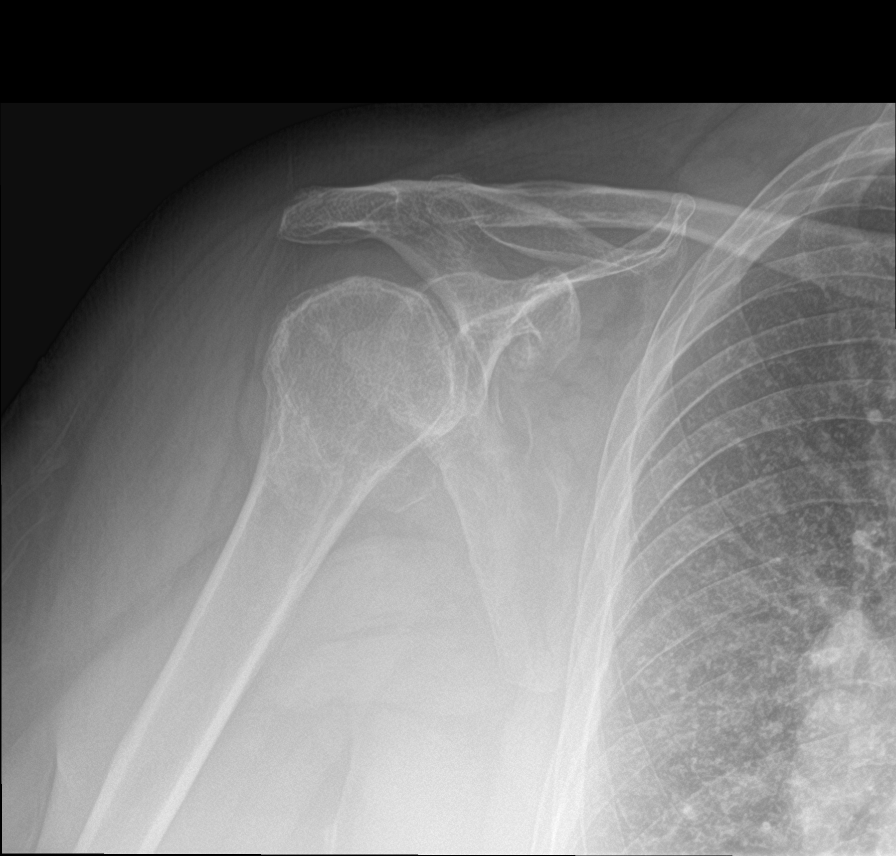

[shoulder ap (2 of 2)]
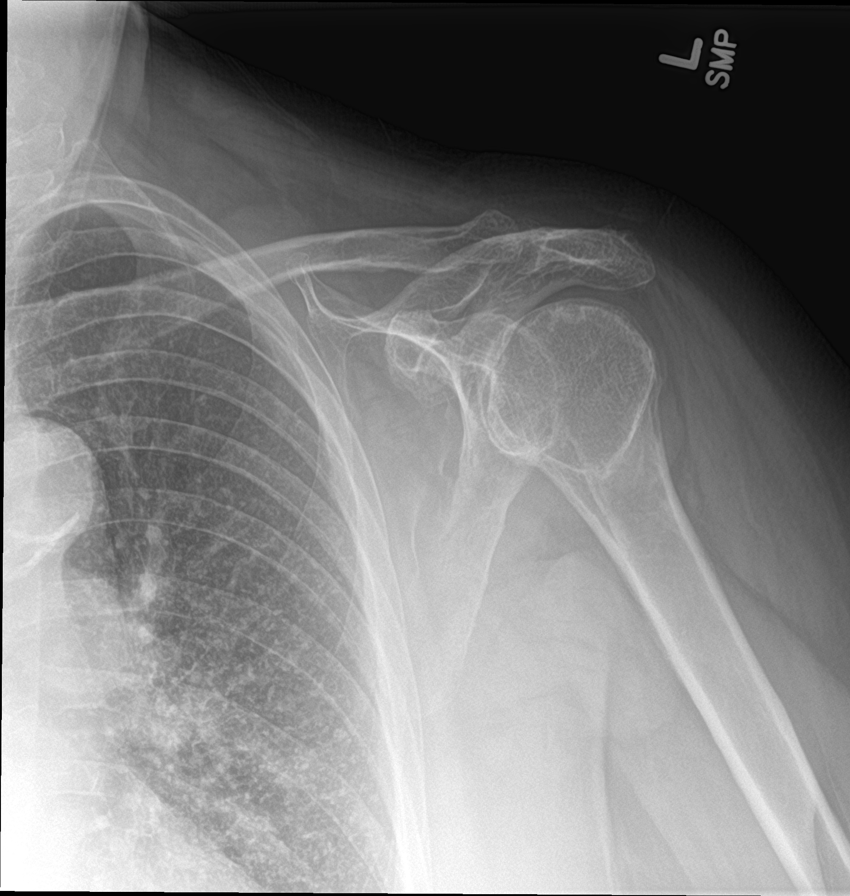

[humerus ap (1 of 2)]
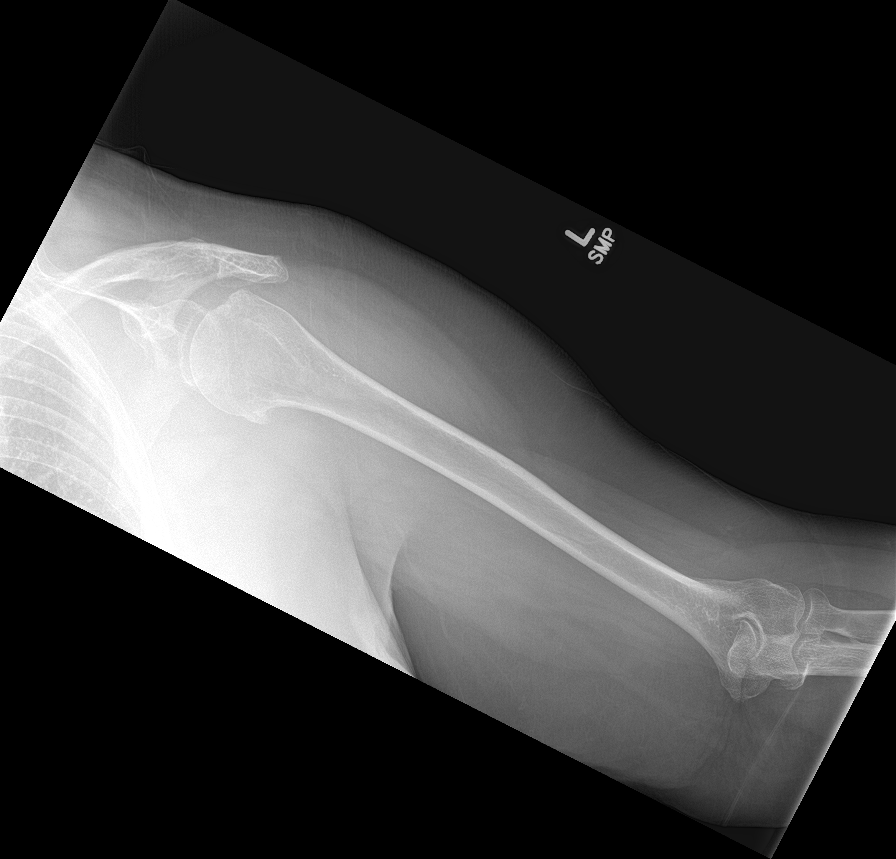

[humerus ap (2 of 2)]
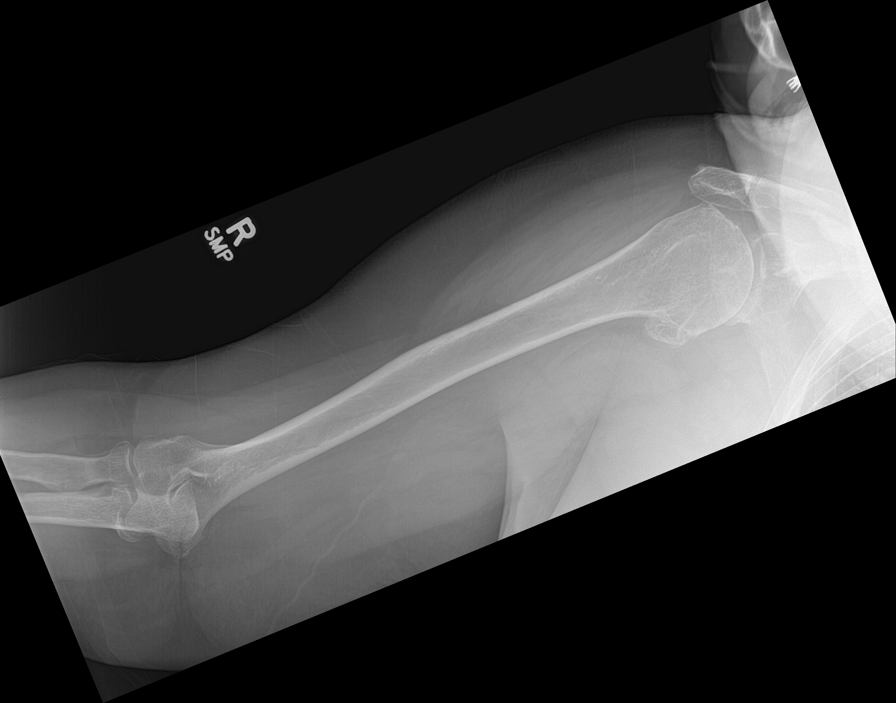

[forearm ap (1 of 2)]
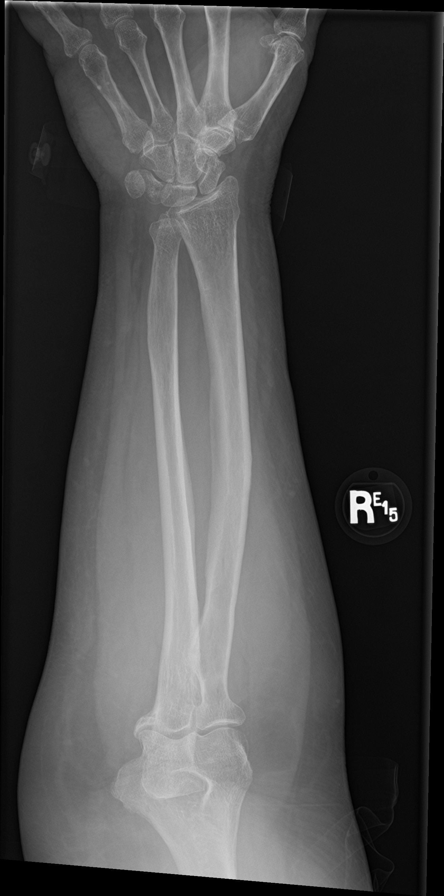

[forearm ap (2 of 2)]
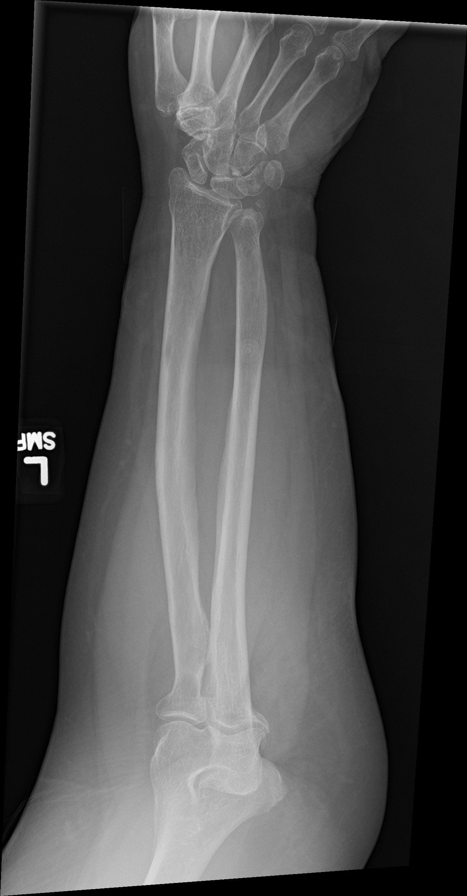

[c-spine ap]
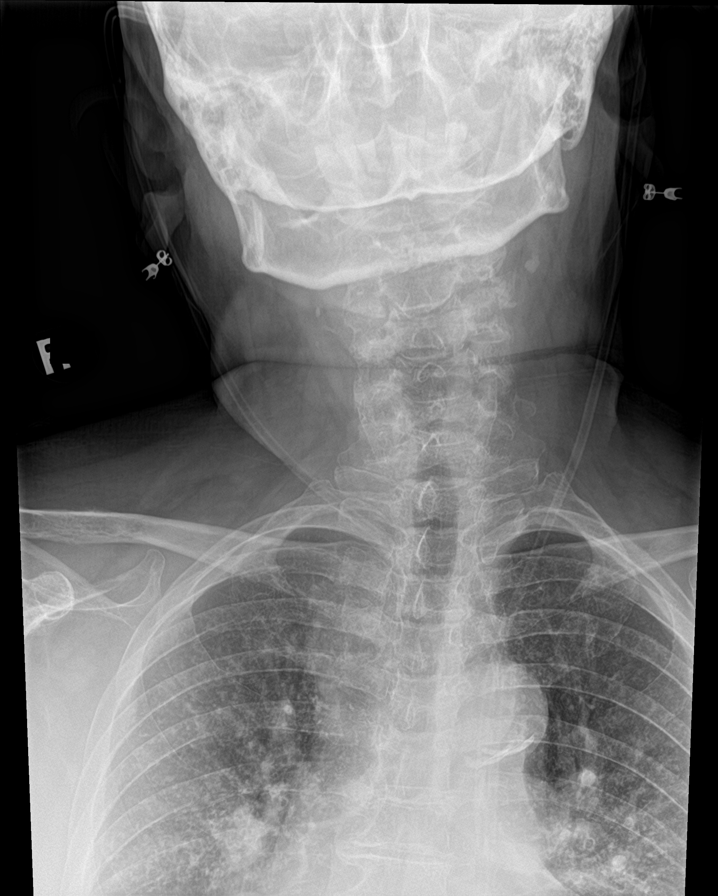

[8 of 10 positions shown; findings below may reference images not displayed]

FINDINGS: Standard bone survey obtained with imaging of the axial and
appendicular skeleton. Diffuse osteopenia and multifocal
degenerative change. Postsurgical changes lower lumbar spine again
noted. Faint lucencies noted throughout the skull on today's exam.
Faint questionable lucency noted of the proximal left femur. Myeloma
and or metastatic disease could present in this fashion. Bilateral
pulmonary interstitial prominence with multiple tiny calcified
pulmonary nodules again noted. Carotid and aortic vascular
calcification noted. Calcified densities right upper quadrant again
noted consistent with gallstones again noted.
IMPRESSION: 1. Faint lucencies noted throughout the skull on today's exam. Faint
questionable lucency noted of the proximal left femur. Myeloma and
or metastatic disease could present this fashion.

2. Chronic pulmonary interstitial disease with interstitial
prominence and multiple tiny calcified nodules again noted.

3.  Carotid and aortic atherosclerotic vascular disease.

4.  Gallstones again noted.

## 2022-08-23 ENCOUNTER — Other Ambulatory Visit: Payer: Self-pay | Admitting: Hematology

## 2022-08-23 DIAGNOSIS — C9000 Multiple myeloma not having achieved remission: Secondary | ICD-10-CM

## 2022-08-24 ENCOUNTER — Other Ambulatory Visit: Payer: Self-pay

## 2022-08-24 DIAGNOSIS — C9000 Multiple myeloma not having achieved remission: Secondary | ICD-10-CM

## 2022-08-24 DIAGNOSIS — J449 Chronic obstructive pulmonary disease, unspecified: Secondary | ICD-10-CM | POA: Diagnosis not present

## 2022-08-24 MED ORDER — LENALIDOMIDE 15 MG PO CAPS
ORAL_CAPSULE | ORAL | 0 refills | Status: DC
Start: 2022-08-24 — End: 2022-10-10

## 2022-08-24 NOTE — Telephone Encounter (Signed)
Chart reviewed. Revlimid refilled per last office note with Dr. Katragadda.  

## 2022-08-30 ENCOUNTER — Ambulatory Visit: Payer: Medicare PPO

## 2022-08-30 ENCOUNTER — Ambulatory Visit: Payer: Medicare PPO | Admitting: Hematology

## 2022-08-30 ENCOUNTER — Inpatient Hospital Stay: Payer: Medicare PPO | Attending: Hematology

## 2022-08-30 ENCOUNTER — Inpatient Hospital Stay: Payer: Medicare PPO

## 2022-08-30 ENCOUNTER — Other Ambulatory Visit: Payer: Medicare PPO

## 2022-08-30 VITALS — BP 116/41 | HR 87 | Temp 98.7°F | Resp 18 | Wt 285.9 lb

## 2022-08-30 VITALS — BP 122/56 | HR 91 | Temp 98.9°F | Resp 18

## 2022-08-30 DIAGNOSIS — Z87891 Personal history of nicotine dependence: Secondary | ICD-10-CM | POA: Diagnosis not present

## 2022-08-30 DIAGNOSIS — Z95828 Presence of other vascular implants and grafts: Secondary | ICD-10-CM

## 2022-08-30 DIAGNOSIS — K7460 Unspecified cirrhosis of liver: Secondary | ICD-10-CM | POA: Insufficient documentation

## 2022-08-30 DIAGNOSIS — Z8041 Family history of malignant neoplasm of ovary: Secondary | ICD-10-CM | POA: Insufficient documentation

## 2022-08-30 DIAGNOSIS — G629 Polyneuropathy, unspecified: Secondary | ICD-10-CM | POA: Diagnosis not present

## 2022-08-30 DIAGNOSIS — D509 Iron deficiency anemia, unspecified: Secondary | ICD-10-CM | POA: Insufficient documentation

## 2022-08-30 DIAGNOSIS — C9000 Multiple myeloma not having achieved remission: Secondary | ICD-10-CM | POA: Diagnosis not present

## 2022-08-30 DIAGNOSIS — D500 Iron deficiency anemia secondary to blood loss (chronic): Secondary | ICD-10-CM

## 2022-08-30 DIAGNOSIS — Z5112 Encounter for antineoplastic immunotherapy: Secondary | ICD-10-CM | POA: Insufficient documentation

## 2022-08-30 LAB — CBC WITH DIFFERENTIAL/PLATELET
Eosinophils Absolute: 0.1 10*3/uL (ref 0.0–0.5)
HCT: 31.8 % — ABNORMAL LOW (ref 36.0–46.0)
Immature Granulocytes: 0 %
MCH: 30.1 pg (ref 26.0–34.0)
MCHC: 30.5 g/dL (ref 30.0–36.0)
Monocytes Relative: 12 %
Neutrophils Relative %: 51 %
Platelets: 92 10*3/uL — ABNORMAL LOW (ref 150–400)
RDW: 17.7 % — ABNORMAL HIGH (ref 11.5–15.5)
nRBC: 0 % (ref 0.0–0.2)

## 2022-08-30 LAB — SAMPLE TO BLOOD BANK

## 2022-08-30 MED ORDER — SODIUM CHLORIDE 0.9% FLUSH
10.0000 mL | INTRAVENOUS | Status: DC | PRN
  Administered 2022-08-30: 10 mL via INTRAVENOUS

## 2022-08-30 MED ORDER — HEPARIN SOD (PORK) LOCK FLUSH 100 UNIT/ML IV SOLN
500.0000 [IU] | Freq: Once | INTRAVENOUS | Status: AC | PRN
  Administered 2022-08-30: 500 [IU]

## 2022-08-30 MED ORDER — SODIUM CHLORIDE 0.9 % IV SOLN
500.0000 mg | Freq: Once | INTRAVENOUS | Status: AC
  Administered 2022-08-30: 500 mg via INTRAVENOUS
  Filled 2022-08-30: qty 25

## 2022-08-30 MED ORDER — CYANOCOBALAMIN 1000 MCG/ML IJ SOLN
1000.0000 ug | Freq: Once | INTRAMUSCULAR | Status: AC
  Administered 2022-08-30: 1000 ug via INTRAMUSCULAR
  Filled 2022-08-30: qty 1

## 2022-08-30 MED ORDER — SODIUM CHLORIDE 0.9 % IV SOLN: Freq: Once | INTRAVENOUS | Status: AC

## 2022-08-30 MED ORDER — DARATUMUMAB-HYALURONIDASE-FIHJ 1800-30000 MG-UT/15ML ~~LOC~~ SOLN
1800.0000 mg | Freq: Once | SUBCUTANEOUS | Status: AC
  Administered 2022-08-30: 1800 mg via SUBCUTANEOUS
  Filled 2022-08-30: qty 15

## 2022-08-30 MED ORDER — SODIUM CHLORIDE 0.9% FLUSH
10.0000 mL | Freq: Once | INTRAVENOUS | Status: AC | PRN
  Administered 2022-08-30: 10 mL

## 2022-08-30 NOTE — Progress Notes (Signed)
Patients port flushed without difficulty.  Good blood return noted with no bruising or swelling noted at site. VSS. Patient remains accessed for treatment.    

## 2022-08-30 NOTE — Patient Instructions (Signed)
[facility]  Discharge Instructions: Thank you for choosing [facility] to provide your oncology and hematology care.  If you have a lab appointment with the Cancer Center - please note that after April 8th, 2024, all labs will be drawn in the cancer center.  You do not have to check in or register with the main entrance as you have in the past but will complete your check-in in the cancer center.  Wear comfortable clothing and clothing appropriate for easy access to any Portacath or PICC line.   We strive to give you quality time with your provider. You may need to reschedule your appointment if you arrive late (15 or more minutes).  Arriving late affects you and other patients whose appointments are after yours.  Also, if you miss three or more appointments without notifying the office, you may be dismissed from the clinic at the provider's discretion.      For prescription refill requests, have your pharmacy contact our office and allow 72 hours for refills to be completed.    Today you received the following chemotherapy and/or immunotherapy agents Dara, B12, and Venofer.  Daratumumab Injection What is this medication? DARATUMUMAB (dar a toom ue mab) treats multiple myeloma, a type of bone marrow cancer. It works by helping your immune system slow or stop the spread of cancer cells. It is a monoclonal antibody. This medicine may be used for other purposes; ask your health care provider or pharmacist if you have questions. COMMON BRAND NAME(S): DARZALEX What should I tell my care team before I take this medication? They need to know if you have any of these conditions: Hereditary fructose intolerance Infection, such as chickenpox, herpes, hepatitis B Lung or breathing disease, such as asthma, COPD An unusual or allergic reaction to daratumumab, sorbitol, other medications, foods, dyes, or preservatives Pregnant or trying to get  pregnant Breastfeeding How should I use this medication? This medication is injected into a vein. It is given by your care team in a hospital or clinic setting. Talk to your care team about the use of this medication in children. Special care may be needed. Overdosage: If you think you have taken too much of this medicine contact a poison control center or emergency room at once. NOTE: This medicine is only for you. Do not share this medicine with others. What if I miss a dose? Keep appointments for follow-up doses. It is important not to miss your dose. Call your care team if you are unable to keep an appointment. What may interact with this medication? Interactions have not been studied. This list may not describe all possible interactions. Give your health care provider a list of all the medicines, herbs, non-prescription drugs, or dietary supplements you use. Also tell them if you smoke, drink alcohol, or use illegal drugs. Some items may interact with your medicine. What should I watch for while using this medication? Your condition will be monitored carefully while you are receiving this medication. This medication can cause serious allergic reactions. To reduce your risk, your care team may give you other medication to take before receiving this one. Be sure to follow the directions from your care team. This medication can affect the results of blood tests to match your blood type. These changes can last for up to 6 months after the final dose. Your care team will do blood tests to match your blood type before you start treatment. Tell all of your care  team that you are being treated with this medication before receiving a blood transfusion. This medication can affect the results of some tests used to determine treatment response; extra tests may be needed to evaluate response. Talk to your care team if you wish to become pregnant or think you are pregnant. This medication can cause serious  birth defects if taken during pregnancy and for 3 months after the last dose. A reliable form of contraception is recommended while taking this medication and for 3 months after the last dose. Talk to your care team about effective forms of contraception. Do not breast-feed while taking this medication. What side effects may I notice from receiving this medication? Side effects that you should report to your care team as soon as possible: Allergic reactions--skin rash, itching, hives, swelling of the face, lips, tongue, or throat Infection--fever, chills, cough, sore throat, wounds that don't heal, pain or trouble when passing urine, general feeling of discomfort or being unwell Infusion reactions--chest pain, shortness of breath or trouble breathing, feeling faint or lightheaded Unusual bruising or bleeding Side effects that usually do not require medical attention (report to your care team if they continue or are bothersome): Constipation Diarrhea Fatigue Nausea Pain, tingling, or numbness in the hands or feet Swelling of the ankles, hands, or feet This list may not describe all possible side effects. Call your doctor for medical advice about side effects. You may report side effects to FDA at 1-800-FDA-1088. Where should I keep my medication? This medication is given in a hospital or clinic. It will not be stored at home. NOTE: This sheet is a summary. It may not cover all possible information. If you have questions about this medicine, talk to your doctor, pharmacist, or health care provider.  2024 Elsevier/Gold Standard (2022-01-20 00:00:00) Vitamin B12 Injection What is this medication? Vitamin B12 (VAHY tuh min B12) prevents and treats low vitamin B12 levels in your body. It is used in people who do not get enough vitamin B12 from their diet or when their digestive tract does not absorb enough. Vitamin B12 plays an important role in maintaining the health of your nervous system and red  blood cells. This medicine may be used for other purposes; ask your health care provider or pharmacist if you have questions. COMMON BRAND NAME(S): B-12 Compliance Kit, B-12 Injection Kit, Cyomin, Dodex, LA-12, Nutri-Twelve, Physicians EZ Use B-12, Primabalt What should I tell my care team before I take this medication? They need to know if you have any of these conditions: Kidney disease Leber's disease Megaloblastic anemia An unusual or allergic reaction to cyanocobalamin, cobalt, other medications, foods, dyes, or preservatives Pregnant or trying to get pregnant Breast-feeding How should I use this medication? This medication is injected into a muscle or deeply under the skin. It is usually given in a clinic or care team's office. However, your care team may teach you how to inject yourself. Follow all instructions. Talk to your care team about the use of this medication in children. Special care may be needed. Overdosage: If you think you have taken too much of this medicine contact a poison control center or emergency room at once. NOTE: This medicine is only for you. Do not share this medicine with others. What if I miss a dose? If you are given your dose at a clinic or care team's office, call to reschedule your appointment. If you give your own injections, and you miss a dose, take it as soon as you can.  If it is almost time for your next dose, take only that dose. Do not take double or extra doses. What may interact with this medication? Alcohol Colchicine This list may not describe all possible interactions. Give your health care provider a list of all the medicines, herbs, non-prescription drugs, or dietary supplements you use. Also tell them if you smoke, drink alcohol, or use illegal drugs. Some items may interact with your medicine. What should I watch for while using this medication? Visit your care team regularly. You may need blood work done while you are taking this  medication. You may need to follow a special diet. Talk to your care team. Limit your alcohol intake and avoid smoking to get the best benefit. What side effects may I notice from receiving this medication? Side effects that you should report to your care team as soon as possible: Allergic reactions--skin rash, itching, hives, swelling of the face, lips, tongue, or throat Swelling of the ankles, hands, or feet Trouble breathing Side effects that usually do not require medical attention (report to your care team if they continue or are bothersome): Diarrhea This list may not describe all possible side effects. Call your doctor for medical advice about side effects. You may report side effects to FDA at 1-800-FDA-1088. Where should I keep my medication? Keep out of the reach of children. Store at room temperature between 15 and 30 degrees C (59 and 85 degrees F). Protect from light. Throw away any unused medication after the expiration date. NOTE: This sheet is a summary. It may not cover all possible information. If you have questions about this medicine, talk to your doctor, pharmacist, or health care provider.  2024 Elsevier/Gold Standard (2020-11-24 00:00:00) Iron Sucrose Injection What is this medication? IRON SUCROSE (EYE ern SOO krose) treats low levels of iron (iron deficiency anemia) in people with kidney disease. Iron is a mineral that plays an important role in making red blood cells, which carry oxygen from your lungs to the rest of your body. This medicine may be used for other purposes; ask your health care provider or pharmacist if you have questions. COMMON BRAND NAME(S): Venofer What should I tell my care team before I take this medication? They need to know if you have any of these conditions: Anemia not caused by low iron levels Heart disease High levels of iron in the blood Kidney disease Liver disease An unusual or allergic reaction to iron, other medications, foods,  dyes, or preservatives Pregnant or trying to get pregnant Breastfeeding How should I use this medication? This medication is for infusion into a vein. It is given in a hospital or clinic setting. Talk to your care team about the use of this medication in children. While this medication may be prescribed for children as young as 2 years for selected conditions, precautions do apply. Overdosage: If you think you have taken too much of this medicine contact a poison control center or emergency room at once. NOTE: This medicine is only for you. Do not share this medicine with others. What if I miss a dose? Keep appointments for follow-up doses. It is important not to miss your dose. Call your care team if you are unable to keep an appointment. What may interact with this medication? Do not take this medication with any of the following: Deferoxamine Dimercaprol Other iron products This medication may also interact with the following: Chloramphenicol Deferasirox This list may not describe all possible interactions. Give your health care provider  a list of all the medicines, herbs, non-prescription drugs, or dietary supplements you use. Also tell them if you smoke, drink alcohol, or use illegal drugs. Some items may interact with your medicine. What should I watch for while using this medication? Visit your care team regularly. Tell your care team if your symptoms do not start to get better or if they get worse. You may need blood work done while you are taking this medication. You may need to follow a special diet. Talk to your care team. Foods that contain iron include: whole grains/cereals, dried fruits, beans, or peas, leafy green vegetables, and organ meats (liver, kidney). What side effects may I notice from receiving this medication? Side effects that you should report to your care team as soon as possible: Allergic reactions--skin rash, itching, hives, swelling of the face, lips, tongue, or  throat Low blood pressure--dizziness, feeling faint or lightheaded, blurry vision Shortness of breath Side effects that usually do not require medical attention (report to your care team if they continue or are bothersome): Flushing Headache Joint pain Muscle pain Nausea Pain, redness, or irritation at injection site This list may not describe all possible side effects. Call your doctor for medical advice about side effects. You may report side effects to FDA at 1-800-FDA-1088. Where should I keep my medication? This medication is given in a hospital or clinic and will not be stored at home. NOTE: This sheet is a summary. It may not cover all possible information. If you have questions about this medicine, talk to your doctor, pharmacist, or health care provider.  2024 Elsevier/Gold Standard (2021-09-22 00:00:00)       To help prevent nausea and vomiting after your treatment, we encourage you to take your nausea medication as directed.  BELOW ARE SYMPTOMS THAT SHOULD BE REPORTED IMMEDIATELY: *FEVER GREATER THAN 100.4 F (38 C) OR HIGHER *CHILLS OR SWEATING *NAUSEA AND VOMITING THAT IS NOT CONTROLLED WITH YOUR NAUSEA MEDICATION *UNUSUAL SHORTNESS OF BREATH *UNUSUAL BRUISING OR BLEEDING *URINARY PROBLEMS (pain or burning when urinating, or frequent urination) *BOWEL PROBLEMS (unusual diarrhea, constipation, pain near the anus) TENDERNESS IN MOUTH AND THROAT WITH OR WITHOUT PRESENCE OF ULCERS (sore throat, sores in mouth, or a toothache) UNUSUAL RASH, SWELLING OR PAIN  UNUSUAL VAGINAL DISCHARGE OR ITCHING   Items with * indicate a potential emergency and should be followed up as soon as possible or go to the Emergency Department if any problems should occur.  Please show the CHEMOTHERAPY ALERT CARD or IMMUNOTHERAPY ALERT CARD at check-in to the Emergency Department and triage nurse.  Should you have questions after your visit or need to cancel or reschedule your appointment, please  contact [facility] [phone]  and follow the prompts.  Office hours are 8:00 a.m. to 4:30 p.m. Monday - Friday. Please note that voicemails left after 4:00 p.m. may not be returned until the following business day.  We are closed weekends and major holidays. You have access to a nurse at all times for urgent questions. Please call the main number to the clinic [phone] and follow the prompts.  For any non-urgent questions, you may also contact your provider using MyChart. We now offer e-Visits for anyone 58 and older to request care online for non-urgent symptoms. For details visit [email or web address].   Also download the MyChart app! Go to the app store, search "MyChart", open the app, select [facility], and log in with your MyChart username and password.

## 2022-08-30 NOTE — Progress Notes (Signed)
Patient tolerated treatment well with no complaints voiced.  Patient left via wheelchair with family in stable condition.  Vital signs stable at discharge.  Follow up as scheduled.    

## 2022-08-30 NOTE — Progress Notes (Signed)
Patient presents today for iron infusion, B12 injection, and chemotherapy of Dara.  Patient is in satisfactory condition with no new complaints voiced.  Vital signs are stable. Patient reports taking premedications of Claritin, tylenol, and Benadryl at 0730 this morning prior to arrival. Labs to be reviewed and if all labs are within treatment parameters.  We will proceed with chemotherapy treatment per MD orders.  Patient tolerated B12 injection with no complaints voiced.  Site clean and dry with no bruising or swelling noted.  No complaints of pain.    Labs within parameter with exception of platelet count of 92,000. Dr Ellin Saba aware and approved treatment for today. We will continue with treatment plan of Dara per MD.

## 2022-08-31 LAB — KAPPA/LAMBDA LIGHT CHAINS
Kappa free light chain: 20.6 mg/L — ABNORMAL HIGH (ref 3.3–19.4)
Kappa, lambda light chain ratio: 2.1 — ABNORMAL HIGH (ref 0.26–1.65)
Lambda free light chains: 9.8 mg/L (ref 5.7–26.3)

## 2022-09-02 LAB — PROTEIN ELECTROPHORESIS, SERUM
A/G Ratio: 1.3 (ref 0.7–1.7)
Albumin ELP: 2.9 g/dL (ref 2.9–4.4)
Alpha-1-Globulin: 0.3 g/dL (ref 0.0–0.4)
Alpha-2-Globulin: 0.6 g/dL (ref 0.4–1.0)
Beta Globulin: 0.7 g/dL (ref 0.7–1.3)
Gamma Globulin: 0.6 g/dL (ref 0.4–1.8)
Globulin, Total: 2.2 g/dL (ref 2.2–3.9)
M-Spike, %: 0.3 g/dL — ABNORMAL HIGH
Total Protein ELP: 5.1 g/dL — ABNORMAL LOW (ref 6.0–8.5)

## 2022-09-06 DIAGNOSIS — C9000 Multiple myeloma not having achieved remission: Secondary | ICD-10-CM | POA: Diagnosis not present

## 2022-09-06 DIAGNOSIS — F1120 Opioid dependence, uncomplicated: Secondary | ICD-10-CM | POA: Diagnosis not present

## 2022-09-06 DIAGNOSIS — Z6841 Body Mass Index (BMI) 40.0 and over, adult: Secondary | ICD-10-CM | POA: Diagnosis not present

## 2022-09-06 DIAGNOSIS — Z79899 Other long term (current) drug therapy: Secondary | ICD-10-CM | POA: Diagnosis not present

## 2022-09-06 DIAGNOSIS — R030 Elevated blood-pressure reading, without diagnosis of hypertension: Secondary | ICD-10-CM | POA: Diagnosis not present

## 2022-09-06 DIAGNOSIS — E119 Type 2 diabetes mellitus without complications: Secondary | ICD-10-CM | POA: Diagnosis not present

## 2022-09-06 DIAGNOSIS — M5136 Other intervertebral disc degeneration, lumbar region: Secondary | ICD-10-CM | POA: Diagnosis not present

## 2022-09-06 DIAGNOSIS — G894 Chronic pain syndrome: Secondary | ICD-10-CM | POA: Diagnosis not present

## 2022-09-07 LAB — CBC WITH DIFFERENTIAL/PLATELET
Abs Immature Granulocytes: 0.01 10*3/uL (ref 0.00–0.07)
Basophils Absolute: 0 10*3/uL (ref 0.0–0.1)
Basophils Relative: 0 %
Eosinophils Relative: 3 %
Hemoglobin: 9.7 g/dL — ABNORMAL LOW (ref 12.0–15.0)
Lymphocytes Relative: 34 %
Lymphs Abs: 0.8 10*3/uL (ref 0.7–4.0)
MCV: 98.8 fL (ref 80.0–100.0)
Monocytes Absolute: 0.3 10*3/uL (ref 0.1–1.0)
Neutro Abs: 1.2 10*3/uL — ABNORMAL LOW (ref 1.7–7.7)
RBC: 3.22 MIL/uL — ABNORMAL LOW (ref 3.87–5.11)
WBC: 2.3 10*3/uL — ABNORMAL LOW (ref 4.0–10.5)

## 2022-09-12 NOTE — Progress Notes (Signed)
[facility] [address] [place] [address]    Clinic Day:  09/13/2022  Referring physician: Raliegh Ip, DO  Patient Care Team: Raliegh Ip, DO as PCP - General (Family Medicine) Wyline Mood Dorothe Pea, MD as PCP - Cardiology (Cardiology) Letta Kocher, MD (Rehabilitation) Coralyn Helling, MD as Consulting Physician (Pulmonary Disease) Lanelle Bal, DO as Consulting Physician (Internal Medicine) Merryl Hacker, NP as Nurse Practitioner (Nurse Practitioner) Bonna Gains, MD as Referring Physician (Gastroenterology) Doreatha Massed, MD as Medical Oncologist (Medical Oncology) Therese Sarah, RN as Oncology Nurse Navigator (Medical Oncology)   ASSESSMENT & PLAN:   Assessment: 1. 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  Orderly maturation of erythroid and myeloid series. - Myeloma FISH panel: t(11;14) - Cytogenetics: Failed to grow metaphases - PET scan (11/18/2021): No evidence of active myeloma or plasmacytoma.  Right upper lobe lung infection. - Skull x-rays (10/29/2021) multiple discrete round lucent/lytic lesions in the skull - Worsening M spike and free light chain ratio.  Normal creatinine and calcium.  Multifactorial anemia including blood loss and bone marrow infiltration. - Cycle 1 of DRd started on 12/14/2021 with lenalidomide 15 mg 3 weeks on/1 week off and dexamethasone 20 mg weekly   2.  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  Quit smoking 15 years ago.  No exposure to chemicals or pesticides. - Paternal grandmother had ovarian cancer.  Sister had ovarian cancer.   3.  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  Seen by transplant hepatology service at [facility].    Plan: 1.  Stage II IgG kappa myeloma, standard risk: - She is  tolerating Revlimid, Darzalex and dexamethasone very well. - Reviewed myeloma labs from 08/30/2022: M spike is 0.3 g.  Free light chain ratio is 2.10.  Kappa light chains at 20.6.  Overall stable. - She reported skin lesions in the bilateral axillary region and right posterior chest wall.  On examination there are infectious skin lesions consistent with boils.  They are nonfluctuant.  We will give her Bactrim DS twice daily for 7 days.  She was told to call us if they do not improve. - Labs today: CBC shows white count 2.5 with ANC of 1.5 and platelet count 70.  LFTs are normal. - She is off of Revlimid at this time.  She will start back on 09/19/2022.  We will hold her Revlimid until the following week.  Last Darzalex was 2 weeks ago.   2.  Iron deficiency anemia from chronic GI bleed: - Continue Venofer 500 mg every 2 weeks.  Hemoglobin today improved to 11.4.   3.  Infection prophylaxis: - Continue acyclovir 400 mg twice daily for shingles prophylaxis.  Aspirin on hold due to GI bleed.   4.  Cirrhosis: - Continue Lasix 20 mg daily, carvedilol 6.5 mg daily and lactulose daily.   5.  Peripheral neuropathy: - She feels like walking on tiny pillows which is stable.    No orders of the defined types were placed in this encounter.     I,Katie Daubenspeck,acting as a Neurosurgeon for Doreatha Massed, MD.,have documented all relevant documentation on the behalf of Doreatha Massed, MD,as directed by  Doreatha Massed, MD while in the presence of Doreatha Massed, MD.   I, Doreatha Massed MD, have reviewed the above documentation for  accuracy and completeness, and I agree with the above.   Doreatha Massed, MD   6/18/20245:03 PM  CHIEF COMPLAINT:   Diagnosis: stage II standard risk IgG kappa plasma cell myeloma    Cancer Staging  Multiple myeloma (HCC) Staging form: Plasma Cell Myeloma and Plasma Cell Disorders, AJCC 8th Edition - Clinical stage from 11/30/2021: RISS Stage  II (Beta-2-microglobulin (mg/L): 2.8, Albumin (g/dL): 3, ISS: Stage II, High-risk cytogenetics: Absent, LDH: Normal) - Signed by Doreatha Massed, MD on 11/30/2021    Prior Therapy: none  Current Therapy:  Daratumumab, lenalidomide and dexamethasone    HISTORY OF PRESENT ILLNESS:   Oncology History  Multiple myeloma (HCC)  11/30/2021 Initial Diagnosis   Multiple myeloma (HCC)   11/30/2021 Cancer Staging   Staging form: Plasma Cell Myeloma and Plasma Cell Disorders, AJCC 8th Edition - Clinical stage from 11/30/2021: RISS Stage II (Beta-2-microglobulin (mg/L): 2.8, Albumin (g/dL): 3, ISS: Stage II, High-risk cytogenetics: Absent, LDH: Normal) - Signed by Doreatha Massed, MD on 11/30/2021 Histopathologic type: Multiple myeloma Stage prefix: Initial diagnosis Beta 2 microglobulin range (mg/L): Less than 3.5 Albumin range (g/dL): Less than 3.5 Cytogenetics: t(11;14) translocation Serum calcium level: Normal   12/14/2021 -  Chemotherapy   Patient is on Treatment Plan : MYELOMA  Daratumumab SQ + Lenalidomide + Dexamethasone (DaraRd) q28d        INTERVAL HISTORY:   Savannah Burns is a 72 y.o. female presenting to clinic today for follow up of stage II standard risk IgG kappa plasma cell myeloma. She was last seen by me on 08/02/22.  Today, she states that she is doing well overall. Her appetite level is at 100%. Her energy level is at 50%.  PAST MEDICAL HISTORY:   Past Medical History: Past Medical History:  Diagnosis Date   Adrenal adenoma, left    Stable   Anxiety    Arthritis    bilateral hands   Depression    Diabetes mellitus, type 2 (HCC) 08/12/2008   Qualifier: Diagnosis of  By: Dayton Martes MD, Talia     Dyspnea    Esophageal varices (HCC)    Grade II diastolic dysfunction    History of kidney stones    Hyperlipidemia    Hypertension    Lower back pain    Lower GI bleed 03/19/2020   Panic attacks    Pneumonia    currently taking antibiotic and prednisone for early stages of  pneumonia   Pulmonary nodules    bilateral   Skin cancer    face    Surgical History: Past Surgical History:  Procedure Laterality Date   BIOPSY  04/07/2020   Procedure: BIOPSY;  Surgeon: Lanelle Bal, DO;  [facility]: [facility];  Service: Endoscopy;;   Breast Cystectomy  Right    CESAREAN SECTION     COLONOSCOPY WITH PROPOFOL N/A 01/25/2020   Dr. Marletta Lor: Nonbleeding internal hemorrhoids, diverticulosis, 5 mm polyp removed from the ascending colon, 10 mm polyp removed from the sigmoid colon, 30 mm polyp (tubulovillous adenoma with no high-grade dysplasia) removed from the transverse colon via piecemeal status post tattoo.  Other polyps were tubular adenomas.  3 month surveillance colonoscopy recommended.   COLONOSCOPY WITH PROPOFOL N/A 04/07/2020   Procedure: COLONOSCOPY WITH PROPOFOL;  Surgeon: Lanelle Bal, DO;  [facility]: [facility];  Service: Endoscopy;  Laterality: N/A;  3:00pm, pt knows new time per office   CYSTOSCOPY/URETEROSCOPY/HOLMIUM LASER/STENT PLACEMENT Bilateral 03/01/2019   Procedure: CYSTOSCOPY/RETROGRADEURETEROSCOPY/HOLMIUM LASER/STENT PLACEMENT;  Surgeon: Rene Paci, MD;  Location:  [facility];  Service: Urology;  Laterality: Bilateral;  ONLY NEEDS 60 MIN   ESOPHAGOGASTRODUODENOSCOPY (EGD) WITH PROPOFOL N/A 01/25/2020   Dr. Marletta Lor: 4 columns grade 1 esophageal varices   ESOPHAGOGASTRODUODENOSCOPY (EGD) WITH PROPOFOL N/A 05/18/2020   Procedure: ESOPHAGOGASTRODUODENOSCOPY (EGD) WITH PROPOFOL;  Surgeon: Lanelle Bal, DO;  [facility]: [facility];  Service: Endoscopy;  Laterality: N/A;   ESOPHAGOGASTRODUODENOSCOPY (EGD) WITH PROPOFOL N/A 07/28/2020   Procedure: ESOPHAGOGASTRODUODENOSCOPY (EGD) WITH PROPOFOL;  Surgeon: Lanelle Bal, DO;  [facility]: [facility];  Service: Endoscopy;  Laterality: N/A;  am or early PM due to givens capsule placement   GIVENS CAPSULE STUDY N/A 05/18/2020   Procedure: GIVENS CAPSULE STUDY;  Surgeon: Dolores Frame, MD;  [facility]: [facility];  Service: Gastroenterology;  Laterality: N/A;   GIVENS CAPSULE STUDY N/A 07/28/2020   Procedure: GIVENS CAPSULE STUDY;  Surgeon: Lanelle Bal, DO;  [facility];  Service: Endoscopy;  Laterality: N/A;   IR IMAGING GUIDED PORT INSERTION  12/02/2021   POLYPECTOMY  01/25/2020   Procedure: POLYPECTOMY;  Surgeon: Lanelle Bal, DO;  [facility];  Service: Endoscopy;;   POLYPECTOMY  04/07/2020   Procedure: POLYPECTOMY INTESTINAL;  Surgeon: Lanelle Bal, DO;  [facility];  Service: Endoscopy;;   SKIN CANCER EXCISION     Face   SPINE SURGERY     SUBMUCOSAL TATTOO INJECTION  01/25/2020   Procedure: SUBMUCOSAL TATTOO INJECTION;  Surgeon: Lanelle Bal, DO;  [facility]: [facility];  Service: Endoscopy;;    Social History: Social History   Socioeconomic History   Marital status: Widowed    Spouse name: Not on file   Number of children: 2   Years of education: 14   Highest education level: Not on file  Occupational History   Occupation: Retired   Tobacco Use   Smoking status: Former    Packs/day: 1.50    Years: 40.00    Additional pack years: 0.00    Total pack years: 60.00    Types: Cigarettes    Quit date: 04/29/2015    Years since quitting: 7.3   Smokeless tobacco: Never   Tobacco comments:    Quit smoking 04/2015- Previous 1.5 ppd smoker  Vaping Use   Vaping Use: Never used  Substance and Sexual Activity   Alcohol use: No    Alcohol/week: 0.0 standard drinks of alcohol   Drug use: No   Sexual activity: Not Currently    Birth control/protection: Post-menopausal  Other Topics Concern   Not on file  Social History Narrative   Her 28 year old granddaughter lives with her - one daughter lives nearby, but she doesn't have a good relationship with her. Has a great relationship with other daughter who lives 1.5 hrs away - talks to her daily on the phone.   Social Determinants of Health   Financial  Resource Strain: Medium Risk (07/27/2021)   Overall Financial Resource Strain (CARDIA)    Difficulty of Paying Living Expenses: Somewhat hard  Food Insecurity: Food Insecurity Present (07/27/2021)   Hunger Vital Sign    Worried About Running Out of Food in the Last Year: Sometimes true    Ran Out of Food in the Last Year: Never true  Transportation Needs: No Transportation Needs (07/27/2021)   PRAPARE - Administrator, Civil Service (Medical): No    Lack of Transportation (Non-Medical): No  Physical Activity: Inactive (07/27/2021)   Exercise Vital Sign  Days of Exercise per Week: 0 days    Minutes of Exercise per Session: 0 min  Stress: Stress Concern Present (07/27/2021)   [employer] of Occupational Health - Occupational Stress Questionnaire    Feeling of Stress : To some extent  Social Connections: Socially Isolated (07/27/2021)   Social Connection and Isolation Panel [NHANES]    Frequency of Communication with Friends and Family: More than three times a week    Frequency of Social Gatherings with Friends and Family: Once a week    Attends Religious Services: Never    Database administrator or Organizations: No    Attends Banker Meetings: Never    Marital Status: Widowed  Intimate Partner Violence: Not At Risk (07/27/2021)   Humiliation, Afraid, Rape, and Kick questionnaire    Fear of Current or Ex-Partner: No    Emotionally Abused: No    Physically Abused: No    Sexually Abused: No    Family History: Family History  Problem Relation Age of Onset   Diabetes Father    Heart disease Father 72       MI   Hypertension Father    Anemia Mother        Transfusion dependent   COPD Sister    Cancer Paternal Grandmother 105       Pancreatic    Current Medications:  Current Outpatient Medications:    acyclovir (ZOVIRAX) 400 MG tablet, Take 1 tablet (400 mg total) by mouth 2 (two) times daily., Disp: 60 tablet, Rfl: 6   Alpha-Lipoic Acid 600 MG CAPS, Take  1 capsule (600 mg total) by mouth daily. For neuropathy, Disp: 100 capsule, Rfl: 3   bacitracin ointment, Apply 1 Application topically 2 (two) times daily., Disp: 120 g, Rfl: 0   baclofen (LIORESAL) 10 MG tablet, Take 10 mg by mouth 2 (two) times daily., Disp: , Rfl:    Blood Glucose Calibration (TRUE METRIX LEVEL 1) Low SOLN, Use w/ glucose monitor Dx E11.9, Disp: 3 each, Rfl: 0   Blood Glucose Monitoring Suppl (TRUE METRIX AIR GLUCOSE METER) w/Device KIT, Check BS daily Dx E11.9, Disp: 1 kit, Rfl: 0   carvedilol (COREG) 6.25 MG tablet, Take 6.25 mg by mouth 2 (two) times daily., Disp: , Rfl:    Cholecalciferol (VITAMIN D) 50 MCG (2000 UT) tablet, Take 2,000 Units by mouth daily., Disp: , Rfl:    clobetasol (TEMOVATE) 0.05 % external solution, Apply topically., Disp: , Rfl:    CONSTULOSE 10 GM/15ML solution, Take by mouth., Disp: , Rfl:    Cyanocobalamin (B-12 COMPLIANCE INJECTION) 1000 MCG/ML KIT, Inject 1,000 mcg as directed every 30 (thirty) days., Disp: , Rfl:    daratumumab-hyaluronidase-fihj (DARZALEX FASPRO) 1800-30000 MG-UT/15ML SOLN, Inject 1,800 mg into the skin once a week., Disp: , Rfl:    desvenlafaxine (PRISTIQ) 100 MG 24 hr tablet, Take 1 tablet (100 mg total) by mouth daily., Disp: 90 tablet, Rfl: 3   dexamethasone (DECADRON) 4 MG tablet, Take 5 tablets (20 mg total) by mouth once a week., Disp: 20 tablet, Rfl: 3   esomeprazole (NEXIUM) 20 MG capsule, Take 1 capsule (20 mg total) by mouth daily at 12 noon., Disp: 90 capsule, Rfl: 3   fluticasone (CUTIVATE) 0.05 % cream, Apply 1 Application topically daily., Disp: , Rfl:    furosemide (LASIX) 20 MG tablet, Take 20 mg by mouth. bid, Disp: , Rfl:    glucose blood (TRUE METRIX BLOOD GLUCOSE TEST) test strip, Check BS daily Dx E11.9, Disp:  100 each, Rfl: 3   lenalidomide (REVLIMID) 15 MG capsule, TAKE 1 CAPSULE BY MOUTH EVERY DAY FOR 21 DAYS ON THEN 7 DAYS OFF, Disp: 21 capsule, Rfl: 0   lidocaine-prilocaine (EMLA) cream, Apply 1  Application topically as needed. Apply a small amount to port a cath site and cover with plastic wrap 1 hour prior to infusion appointments, Disp: 30 g, Rfl: 3   lovastatin (MEVACOR) 20 MG tablet, Take 1 tablet (20 mg total) by mouth at bedtime., Disp: 90 tablet, Rfl: 3   omeprazole (PRILOSEC) 20 MG capsule, Take 20 mg by mouth 2 (two) times daily., Disp: , Rfl:    oxyCODONE-acetaminophen (PERCOCET) 10-325 MG tablet, Take 1 tablet by mouth every 6 (six) hours as needed for pain., Disp: , Rfl:    OXYGEN, Inhale 3 L into the lungs continuous., Disp: , Rfl:    OZEMPIC, 0.25 OR 0.5 MG/DOSE, 2 MG/3ML SOPN, Inject into the skin., Disp: , Rfl:    potassium chloride SA (KLOR-CON) 20 MEQ tablet, Take 2 tablets (40 mEq total) by mouth 2 (two) times daily. (Patient taking differently: Take 40 mEq by mouth daily.), Disp: 360 tablet, Rfl: 3   prochlorperazine (COMPAZINE) 10 MG tablet, Take 1 tablet (10 mg total) by mouth every 6 (six) hours as needed for nausea or vomiting., Disp: 30 tablet, Rfl: 6   sulfamethoxazole-trimethoprim (BACTRIM DS) 800-160 MG tablet, Take 1 tablet by mouth 2 (two) times daily., Disp: 14 tablet, Rfl: 0   TRUEplus Lancets 33G MISC, Check BS daily Dx E11.9, Disp: 100 each, Rfl: 3   ferrous sulfate 325 (65 FE) MG tablet, Take 1 tablet (325 mg total) by mouth daily with breakfast., Disp: 30 tablet, Rfl: 3   torsemide (DEMADEX) 20 MG tablet, Take 3 tablets (60 mg total) by mouth 2 (two) times daily. (Patient taking differently: Take 50 mg by mouth 2 (two) times daily.), Disp: 180 tablet, Rfl: 11 No current facility-administered medications for this visit.  Facility-Administered Medications Ordered in Other Visits:    0.9 %  sodium chloride infusion, , Intravenous, Once PRN, Doreatha Massed, MD   albuterol (PROVENTIL) (2.5 MG/3ML) 0.083% nebulizer solution 2.5 mg, 2.5 mg, Nebulization, Once PRN, Doreatha Massed, MD   diphenhydrAMINE (BENADRYL) injection 50 mg, 50 mg, Intravenous,  Once PRN, Doreatha Massed, MD   EPINEPHrine (EPI-PEN) injection 0.3 mg, 0.3 mg, Intramuscular, Once PRN, Doreatha Massed, MD   famotidine (PEPCID) IVPB 20 mg premix, 20 mg, Intravenous, Once PRN, Doreatha Massed, MD   methylPREDNISolone sodium succinate (SOLU-MEDROL) 125 mg/2 mL injection 125 mg, 125 mg, Intravenous, Once PRN, Doreatha Massed, MD   sodium chloride flush (NS) 0.9 % injection 10 mL, 10 mL, Intravenous, PRN, Pennington, Rebekah M, PA-C, 10 mL at 03/08/22 1344   sodium chloride flush (NS) 0.9 % injection 10 mL, 10 mL, Intravenous, PRN, Doreatha Massed, MD, 10 mL at 09/13/22 1322   Allergies: Allergies  Allergen Reactions   Keflex [Cephalexin] Nausea And Vomiting    REVIEW OF SYSTEMS:   Review of Systems  Constitutional:  Negative for chills, fatigue and fever.  HENT:   Negative for lump/mass, mouth sores, nosebleeds, sore throat and trouble swallowing.   Eyes:  Negative for eye problems.  Respiratory:  Negative for cough and shortness of breath.   Cardiovascular:  Negative for chest pain, leg swelling and palpitations.  Gastrointestinal:  Negative for abdominal pain, constipation, diarrhea, nausea and vomiting.  Genitourinary:  Negative for bladder incontinence, difficulty urinating, dysuria, frequency, hematuria and nocturia.  Musculoskeletal:  Negative for arthralgias, back pain, flank pain, myalgias and neck pain.  Skin:  Negative for itching and rash.  Neurological:  Positive for numbness. Negative for dizziness and headaches.  Hematological:  Does not bruise/bleed easily.  Psychiatric/Behavioral:  Negative for depression, sleep disturbance and suicidal ideas. The patient is not nervous/anxious.   All other systems reviewed and are negative.    VITALS:   Weight 279 lb (126.6 kg).  Wt Readings from Last 3 Encounters:  09/13/22 279 lb (126.6 kg)  08/30/22 285 lb 15 oz (129.7 kg)  08/02/22 294 lb 1.5 oz (133.4 kg)    Body mass index is  51.03 kg/m.  Performance status (ECOG): 1 - Symptomatic but completely ambulatory  PHYSICAL EXAM:   Physical Exam Vitals and nursing note reviewed. Exam conducted with a chaperone present.  Constitutional:      Appearance: Normal appearance.  Cardiovascular:     Rate and Rhythm: Normal rate and regular rhythm.     Pulses: Normal pulses.     Heart sounds: Normal heart sounds.  Pulmonary:     Effort: Pulmonary effort is normal.     Breath sounds: Normal breath sounds.  Abdominal:     Palpations: Abdomen is soft. There is no hepatomegaly, splenomegaly or mass.     Tenderness: There is no abdominal tenderness.  Musculoskeletal:     Right lower leg: No edema.     Left lower leg: No edema.  Lymphadenopathy:     Cervical: No cervical adenopathy.     Right cervical: No superficial, deep or posterior cervical adenopathy.    Left cervical: No superficial, deep or posterior cervical adenopathy.     Upper Body:     Right upper body: No supraclavicular or axillary adenopathy.     Left upper body: No supraclavicular or axillary adenopathy.  Skin:    Comments: Carbuncles in the bilateral axillary region and right posterior chest wall skin.  Neurological:     General: No focal deficit present.     Mental Status: She is alert and oriented to person, place, and time.  Psychiatric:        Mood and Affect: Mood normal.        Behavior: Behavior normal.     LABS:      Latest Ref Rng & Units 09/13/2022    9:15 AM 08/30/2022    8:18 AM 08/02/2022    8:16 AM  CBC  WBC 4.0 - 10.5 K/uL 2.5  2.3  2.7   Hemoglobin 12.0 - 15.0 g/dL 16.1  9.7  8.1   Hematocrit 36.0 - 46.0 % 36.1  31.8  26.7   Platelets 150 - 400 K/uL 70  92  86       Latest Ref Rng & Units 09/13/2022    9:15 AM 08/02/2022    8:16 AM 06/07/2022    8:08 AM  CMP  Glucose 70 - 99 mg/dL 096  045  409   BUN 8 - 23 mg/dL 11  15  14    Creatinine 0.44 - 1.00 mg/dL 8.11  9.14  7.82   Sodium 135 - 145 mmol/L 138  137  135   Potassium  3.5 - 5.1 mmol/L 3.4  3.6  3.9   Chloride 98 - 111 mmol/L 104  101  105   CO2 22 - 32 mmol/L 27  28  27    Calcium 8.9 - 10.3 mg/dL 8.1  8.0  7.9   Total Protein 6.5 - 8.1 g/dL  5.8  5.5  5.8   Total Bilirubin 0.3 - 1.2 mg/dL 0.9  0.4  0.5   Alkaline Phos 38 - 126 U/L 90  77  82   AST 15 - 41 U/L 20  13  20    ALT 0 - 44 U/L 17  11  12       No results found for: "CEA1", "CEA" / No results found for: "CEA1", "CEA" No results found for: "PSA1" No results found for: "WUJ811" No results found for: "CAN125"  Lab Results  Component Value Date   TOTALPROTELP 5.1 (L) 08/30/2022   ALBUMINELP 2.9 08/30/2022   A1GS 0.3 08/30/2022   A2GS 0.6 08/30/2022   BETS 0.7 08/30/2022   GAMS 0.6 08/30/2022   MSPIKE 0.3 (H) 08/30/2022   SPEI Comment 08/30/2022   Lab Results  Component Value Date   TIBC 338 08/02/2022   TIBC 375 06/07/2022   TIBC 425 03/31/2022   FERRITIN 81 08/02/2022   FERRITIN 51 06/07/2022   FERRITIN 66 03/31/2022   IRONPCTSAT 13 08/02/2022   IRONPCTSAT 27 06/07/2022   IRONPCTSAT 9 (L) 03/31/2022   Lab Results  Component Value Date   LDH 92 (L) 10/22/2021   LDH 101 06/25/2021   LDH 103 03/24/2021     STUDIES:   No results found.

## 2022-09-13 ENCOUNTER — Inpatient Hospital Stay (HOSPITAL_BASED_OUTPATIENT_CLINIC_OR_DEPARTMENT_OTHER): Payer: Medicare PPO | Admitting: Hematology

## 2022-09-13 ENCOUNTER — Inpatient Hospital Stay: Payer: Medicare PPO

## 2022-09-13 VITALS — Wt 279.0 lb

## 2022-09-13 VITALS — BP 129/54 | HR 72 | Temp 98.2°F | Resp 20

## 2022-09-13 VITALS — BP 119/73 | HR 71 | Temp 98.8°F | Resp 20

## 2022-09-13 DIAGNOSIS — C9000 Multiple myeloma not having achieved remission: Secondary | ICD-10-CM

## 2022-09-13 DIAGNOSIS — Z8041 Family history of malignant neoplasm of ovary: Secondary | ICD-10-CM | POA: Diagnosis not present

## 2022-09-13 DIAGNOSIS — Z87891 Personal history of nicotine dependence: Secondary | ICD-10-CM | POA: Diagnosis not present

## 2022-09-13 DIAGNOSIS — Z95828 Presence of other vascular implants and grafts: Secondary | ICD-10-CM

## 2022-09-13 DIAGNOSIS — K7460 Unspecified cirrhosis of liver: Secondary | ICD-10-CM | POA: Diagnosis not present

## 2022-09-13 DIAGNOSIS — Z5112 Encounter for antineoplastic immunotherapy: Secondary | ICD-10-CM | POA: Diagnosis not present

## 2022-09-13 DIAGNOSIS — G629 Polyneuropathy, unspecified: Secondary | ICD-10-CM | POA: Diagnosis not present

## 2022-09-13 DIAGNOSIS — D509 Iron deficiency anemia, unspecified: Secondary | ICD-10-CM | POA: Diagnosis not present

## 2022-09-13 DIAGNOSIS — D500 Iron deficiency anemia secondary to blood loss (chronic): Secondary | ICD-10-CM

## 2022-09-13 LAB — CBC WITH DIFFERENTIAL/PLATELET
Abs Immature Granulocytes: 0.01 10*3/uL (ref 0.00–0.07)
Basophils Absolute: 0 10*3/uL (ref 0.0–0.1)
Basophils Relative: 0 %
Eosinophils Absolute: 0.1 10*3/uL (ref 0.0–0.5)
Eosinophils Relative: 3 %
HCT: 36.1 % (ref 36.0–46.0)
Hemoglobin: 11.4 g/dL — ABNORMAL LOW (ref 12.0–15.0)
Immature Granulocytes: 0 %
Lymphocytes Relative: 26 %
Lymphs Abs: 0.6 10*3/uL — ABNORMAL LOW (ref 0.7–4.0)
MCH: 31.1 pg (ref 26.0–34.0)
MCHC: 31.6 g/dL (ref 30.0–36.0)
MCV: 98.4 fL (ref 80.0–100.0)
Monocytes Absolute: 0.2 10*3/uL (ref 0.1–1.0)
Monocytes Relative: 8 %
Neutro Abs: 1.5 10*3/uL — ABNORMAL LOW (ref 1.7–7.7)
Neutrophils Relative %: 63 %
Platelets: 70 10*3/uL — ABNORMAL LOW (ref 150–400)
RBC: 3.67 MIL/uL — ABNORMAL LOW (ref 3.87–5.11)
RDW: 17.5 % — ABNORMAL HIGH (ref 11.5–15.5)
WBC: 2.5 10*3/uL — ABNORMAL LOW (ref 4.0–10.5)
nRBC: 0 % (ref 0.0–0.2)

## 2022-09-13 LAB — COMPREHENSIVE METABOLIC PANEL
ALT: 17 U/L (ref 0–44)
AST: 20 U/L (ref 15–41)
Albumin: 3.3 g/dL — ABNORMAL LOW (ref 3.5–5.0)
Alkaline Phosphatase: 90 U/L (ref 38–126)
Anion gap: 7 (ref 5–15)
BUN: 11 mg/dL (ref 8–23)
CO2: 27 mmol/L (ref 22–32)
Calcium: 8.1 mg/dL — ABNORMAL LOW (ref 8.9–10.3)
Chloride: 104 mmol/L (ref 98–111)
Creatinine, Ser: 0.56 mg/dL (ref 0.44–1.00)
GFR, Estimated: >60 mL/min (ref >60)
Glucose, Bld: 139 mg/dL — ABNORMAL HIGH (ref 70–99)
Potassium: 3.4 mmol/L — ABNORMAL LOW (ref 3.5–5.1)
Sodium: 138 mmol/L (ref 135–145)
Total Bilirubin: 0.9 mg/dL (ref 0.3–1.2)
Total Protein: 5.8 g/dL — ABNORMAL LOW (ref 6.5–8.1)

## 2022-09-13 LAB — MAGNESIUM: Magnesium: 1.8 mg/dL (ref 1.7–2.4)

## 2022-09-13 MED ORDER — HEPARIN SOD (PORK) LOCK FLUSH 100 UNIT/ML IV SOLN
500.0000 [IU] | Freq: Once | INTRAVENOUS | Status: AC
  Administered 2022-09-13: 500 [IU] via INTRAVENOUS

## 2022-09-13 MED ORDER — SODIUM CHLORIDE 0.9 % IV SOLN: Freq: Once | INTRAVENOUS | Status: AC

## 2022-09-13 MED ORDER — METHYLPREDNISOLONE SODIUM SUCC 125 MG IJ SOLR: 125.0000 mg | Freq: Once | INTRAMUSCULAR | Status: DC | PRN

## 2022-09-13 MED ORDER — DIPHENHYDRAMINE HCL 50 MG/ML IJ SOLN: 50.0000 mg | Freq: Once | INTRAMUSCULAR | Status: DC | PRN

## 2022-09-13 MED ORDER — SODIUM CHLORIDE 0.9% FLUSH
10.0000 mL | INTRAVENOUS | Status: DC | PRN
  Administered 2022-09-13: 10 mL via INTRAVENOUS

## 2022-09-13 MED ORDER — ALBUTEROL SULFATE (2.5 MG/3ML) 0.083% IN NEBU: 2.5000 mg | INHALATION_SOLUTION | Freq: Once | RESPIRATORY_TRACT | Status: DC | PRN

## 2022-09-13 MED ORDER — SODIUM CHLORIDE 0.9 % IV SOLN: Freq: Once | INTRAVENOUS | Status: DC | PRN

## 2022-09-13 MED ORDER — CYANOCOBALAMIN 1000 MCG/ML IJ SOLN
1000.0000 ug | Freq: Once | INTRAMUSCULAR | Status: AC
  Administered 2022-09-13: 1000 ug via INTRAMUSCULAR
  Filled 2022-09-13: qty 1

## 2022-09-13 MED ORDER — EPINEPHRINE 0.3 MG/0.3ML IJ SOAJ: 0.3000 mg | Freq: Once | INTRAMUSCULAR | Status: DC | PRN

## 2022-09-13 MED ORDER — FAMOTIDINE IN NACL 20-0.9 MG/50ML-% IV SOLN: 20.0000 mg | Freq: Once | INTRAVENOUS | Status: DC | PRN

## 2022-09-13 MED ORDER — SODIUM CHLORIDE 0.9 % IV SOLN
500.0000 mg | Freq: Once | INTRAVENOUS | Status: AC
  Administered 2022-09-13: 500 mg via INTRAVENOUS
  Filled 2022-09-13: qty 25

## 2022-09-13 MED ORDER — SULFAMETHOXAZOLE-TRIMETHOPRIM 800-160 MG PO TABS: 1.0000 | ORAL_TABLET | Freq: Two times a day (BID) | ORAL | 0 refills | Status: DC

## 2022-09-13 NOTE — Patient Instructions (Signed)
[facility] at [facility] Discharge Instructions   You were seen and examined today by Dr. Katragadda.  He reviewed the results of your lab work which are normal/stable.   We will proceed with your treatment today.  Return as scheduled.    Thank you for choosing [facility] at [facility] to provide your oncology and hematology care.  To afford each patient quality time with our provider, please arrive at least 15 minutes before your scheduled appointment time.   If you have a lab appointment with the Cancer Center please come in thru the Main Entrance and check in at the main information desk.  You need to re-schedule your appointment should you arrive 10 or more minutes late.  We strive to give you quality time with our providers, and arriving late affects you and other patients whose appointments are after yours.  Also, if you no show three or more times for appointments you may be dismissed from the clinic at the providers discretion.     Again, thank you for choosing [facility].  Our hope is that these requests will decrease the amount of time that you wait before being seen by our physicians.       _____________________________________________________________  Should you have questions after your visit to [facility], please contact our office at [phone] and follow the prompts.  Our office hours are 8:00 a.m. and 4:30 p.m. Monday - Friday.  Please note that voicemails left after 4:00 p.m. may not be returned until the following business day.  We are closed weekends and major holidays.  You do have access to a nurse 24-7, just call the main number to the clinic [phone] and do not press any options, hold on the line and a nurse will answer the phone.    For prescription refill requests, have your pharmacy contact our office and allow 72 hours.    Due to Covid, you will need to wear a mask upon entering  the hospital. If you do not have a mask, a mask will be given to you at the Main Entrance upon arrival. For doctor visits, patients may have 1 support person age 18 or older with them. For treatment visits, patients can not have anyone with them due to social distancing guidelines and our immunocompromised population.      

## 2022-09-13 NOTE — Progress Notes (Signed)
Patient presents today for iron infusion and B12 injection. Patient is in satisfactory condition with no new complaints voiced.  Vital signs are stable.  We will proceed with infusion per provider orders.    Pt took pre-meds at home prior to arrival.  Treatment given today per MD orders. Tolerated infusion without adverse affects. Vital signs stable. No complaints at this time. Discharged from clinic via wheelchair in stable condition. Alert and oriented x 3. F/U with [facility] as scheduled.

## 2022-09-13 NOTE — Patient Instructions (Signed)
[facility] [place]  Discharge Instructions: Thank you for choosing [facility] to provide your oncology and hematology care.  If you have a lab appointment with the Cancer Center - please note that after April 8th, 2024, all labs will be drawn in the cancer center.  You do not have to check in or register with the main entrance as you have in the past but will complete your check-in in the cancer center.  Wear comfortable clothing and clothing appropriate for easy access to any Portacath or PICC line.   We strive to give you quality time with your provider. You may need to reschedule your appointment if you arrive late (15 or more minutes).  Arriving late affects you and other patients whose appointments are after yours.  Also, if you miss three or more appointments without notifying the office, you may be dismissed from the clinic at the provider's discretion.      For prescription refill requests, have your pharmacy contact our office and allow 72 hours for refills to be completed.    Today you received Venofer IV iron infusion.  BELOW ARE SYMPTOMS THAT SHOULD BE REPORTED IMMEDIATELY: *FEVER GREATER THAN 100.4 F (38 C) OR HIGHER *CHILLS OR SWEATING *NAUSEA AND VOMITING THAT IS NOT CONTROLLED WITH YOUR NAUSEA MEDICATION *UNUSUAL SHORTNESS OF BREATH *UNUSUAL BRUISING OR BLEEDING *URINARY PROBLEMS (pain or burning when urinating, or frequent urination) *BOWEL PROBLEMS (unusual diarrhea, constipation, pain near the anus) TENDERNESS IN MOUTH AND THROAT WITH OR WITHOUT PRESENCE OF ULCERS (sore throat, sores in mouth, or a toothache) UNUSUAL RASH, SWELLING OR PAIN  UNUSUAL VAGINAL DISCHARGE OR ITCHING   Items with * indicate a potential emergency and should be followed up as soon as possible or go to the Emergency Department if any problems should occur.  Please show the CHEMOTHERAPY ALERT CARD or IMMUNOTHERAPY ALERT CARD at check-in to the Emergency Department and  triage nurse.  Should you have questions after your visit or need to cancel or reschedule your appointment, please contact [facility] [phone]  and follow the prompts.  Office hours are 8:00 a.m. to 4:30 p.m. Monday - Friday. Please note that voicemails left after 4:00 p.m. may not be returned until the following business day.  We are closed weekends and major holidays. You have access to a nurse at all times for urgent questions. Please call the main number to the clinic [phone] and follow the prompts.  For any non-urgent questions, you may also contact your provider using MyChart. We now offer e-Visits for anyone 18 and older to request care online for non-urgent symptoms. For details visit [email or web address].   Also download the MyChart app! Go to the app store, search "MyChart", open the app, select [facility], and log in with your MyChart username and password.   

## 2022-09-13 NOTE — Progress Notes (Signed)
Patients port flushed without difficulty.  Good blood return noted with no bruising or swelling noted at site.  Patient remains accessed for treatment.  

## 2022-09-13 NOTE — Progress Notes (Signed)
Patient is taking Revlimid as prescribed.  She has not missed any doses and reports no side effects at this time.    Patient has been examined by Dr. Ellin Saba. Vital signs and labs have been reviewed by MD - ANC, Creatinine, LFTs, hemoglobin, and platelets (70) are within treatment parameters per M.D. - pt may proceed with treatment.  Primary RN and pharmacy notified.

## 2022-09-14 DIAGNOSIS — I890 Lymphedema, not elsewhere classified: Secondary | ICD-10-CM | POA: Diagnosis not present

## 2022-09-19 ENCOUNTER — Other Ambulatory Visit: Payer: Self-pay | Admitting: *Deleted

## 2022-09-19 ENCOUNTER — Telehealth: Payer: Self-pay | Admitting: *Deleted

## 2022-09-19 MED ORDER — CEPHALEXIN 500 MG PO CAPS: 500.0000 mg | ORAL_CAPSULE | Freq: Three times a day (TID) | ORAL | 0 refills | Status: DC

## 2022-09-19 NOTE — Telephone Encounter (Signed)
Patient called to advise that current antibiotic therapy has not been effective for boils.  States they have spread and are painful.  Per Dr. Ellin Saba, script sent in for Keflex 500 mg tid x 7 days and STAT referral will be sent to [facility] for evaluation.  Advised to hold Revlamid while on antibiotics.  Verbalized understanding.

## 2022-09-20 ENCOUNTER — Other Ambulatory Visit: Payer: Self-pay | Admitting: *Deleted

## 2022-09-20 MED ORDER — DOXYCYCLINE HYCLATE 100 MG PO TABS: 100.0000 mg | ORAL_TABLET | Freq: Two times a day (BID) | ORAL | 0 refills | Status: DC

## 2022-09-23 ENCOUNTER — Other Ambulatory Visit: Payer: Self-pay

## 2022-09-23 DIAGNOSIS — C9000 Multiple myeloma not having achieved remission: Secondary | ICD-10-CM

## 2022-09-24 DIAGNOSIS — J449 Chronic obstructive pulmonary disease, unspecified: Secondary | ICD-10-CM | POA: Diagnosis not present

## 2022-09-26 ENCOUNTER — Other Ambulatory Visit: Payer: Self-pay

## 2022-09-26 ENCOUNTER — Emergency Department (HOSPITAL_COMMUNITY): Payer: Medicare PPO

## 2022-09-26 ENCOUNTER — Inpatient Hospital Stay (HOSPITAL_COMMUNITY)
Admission: EM | Admit: 2022-09-26 | Discharge: 2022-10-03 | DRG: 872 | Disposition: A | Discharge status: Home Health | Payer: Medicare PPO | Attending: Internal Medicine | Admitting: Internal Medicine

## 2022-09-26 ENCOUNTER — Encounter (HOSPITAL_COMMUNITY): Payer: Self-pay

## 2022-09-26 ENCOUNTER — Inpatient Hospital Stay (HOSPITAL_COMMUNITY): Payer: Medicare PPO

## 2022-09-26 DIAGNOSIS — Z1152 Encounter for screening for COVID-19: Secondary | ICD-10-CM | POA: Diagnosis not present

## 2022-09-26 DIAGNOSIS — I517 Cardiomegaly: Secondary | ICD-10-CM | POA: Diagnosis not present

## 2022-09-26 DIAGNOSIS — J9611 Chronic respiratory failure with hypoxia: Secondary | ICD-10-CM | POA: Diagnosis not present

## 2022-09-26 DIAGNOSIS — Z7952 Long term (current) use of systemic steroids: Secondary | ICD-10-CM

## 2022-09-26 DIAGNOSIS — I700 Atherosclerosis of aorta: Secondary | ICD-10-CM | POA: Diagnosis not present

## 2022-09-26 DIAGNOSIS — G629 Polyneuropathy, unspecified: Secondary | ICD-10-CM | POA: Diagnosis present

## 2022-09-26 DIAGNOSIS — Z79899 Other long term (current) drug therapy: Secondary | ICD-10-CM

## 2022-09-26 DIAGNOSIS — F410 Panic disorder [episodic paroxysmal anxiety] without agoraphobia: Secondary | ICD-10-CM | POA: Diagnosis present

## 2022-09-26 DIAGNOSIS — E66813 Obesity, class 3: Secondary | ICD-10-CM

## 2022-09-26 DIAGNOSIS — K219 Gastro-esophageal reflux disease without esophagitis: Secondary | ICD-10-CM

## 2022-09-26 DIAGNOSIS — M19041 Primary osteoarthritis, right hand: Secondary | ICD-10-CM | POA: Diagnosis present

## 2022-09-26 DIAGNOSIS — Z86010 Personal history of colonic polyps: Secondary | ICD-10-CM

## 2022-09-26 DIAGNOSIS — Z8719 Personal history of other diseases of the digestive system: Secondary | ICD-10-CM

## 2022-09-26 DIAGNOSIS — I10 Essential (primary) hypertension: Secondary | ICD-10-CM | POA: Diagnosis not present

## 2022-09-26 DIAGNOSIS — Z6841 Body Mass Index (BMI) 40.0 and over, adult: Secondary | ICD-10-CM

## 2022-09-26 DIAGNOSIS — L03818 Cellulitis of other sites: Secondary | ICD-10-CM | POA: Diagnosis not present

## 2022-09-26 DIAGNOSIS — I110 Hypertensive heart disease with heart failure: Secondary | ICD-10-CM | POA: Diagnosis present

## 2022-09-26 DIAGNOSIS — Z85828 Personal history of other malignant neoplasm of skin: Secondary | ICD-10-CM

## 2022-09-26 DIAGNOSIS — J449 Chronic obstructive pulmonary disease, unspecified: Secondary | ICD-10-CM | POA: Diagnosis present

## 2022-09-26 DIAGNOSIS — Z87891 Personal history of nicotine dependence: Secondary | ICD-10-CM

## 2022-09-26 DIAGNOSIS — E119 Type 2 diabetes mellitus without complications: Secondary | ICD-10-CM

## 2022-09-26 DIAGNOSIS — F32A Depression, unspecified: Secondary | ICD-10-CM | POA: Diagnosis present

## 2022-09-26 DIAGNOSIS — I5032 Chronic diastolic (congestive) heart failure: Secondary | ICD-10-CM | POA: Diagnosis present

## 2022-09-26 DIAGNOSIS — L02411 Cutaneous abscess of right axilla: Secondary | ICD-10-CM | POA: Diagnosis present

## 2022-09-26 DIAGNOSIS — F339 Major depressive disorder, recurrent, unspecified: Secondary | ICD-10-CM | POA: Diagnosis not present

## 2022-09-26 DIAGNOSIS — D630 Anemia in neoplastic disease: Secondary | ICD-10-CM | POA: Diagnosis present

## 2022-09-26 DIAGNOSIS — I4510 Unspecified right bundle-branch block: Secondary | ICD-10-CM | POA: Diagnosis present

## 2022-09-26 DIAGNOSIS — E1169 Type 2 diabetes mellitus with other specified complication: Secondary | ICD-10-CM | POA: Diagnosis not present

## 2022-09-26 DIAGNOSIS — K7460 Unspecified cirrhosis of liver: Secondary | ICD-10-CM | POA: Diagnosis present

## 2022-09-26 DIAGNOSIS — R509 Fever, unspecified: Secondary | ICD-10-CM | POA: Diagnosis present

## 2022-09-26 DIAGNOSIS — K7581 Nonalcoholic steatohepatitis (NASH): Secondary | ICD-10-CM | POA: Diagnosis present

## 2022-09-26 DIAGNOSIS — L02419 Cutaneous abscess of limb, unspecified: Secondary | ICD-10-CM

## 2022-09-26 DIAGNOSIS — D696 Thrombocytopenia, unspecified: Secondary | ICD-10-CM | POA: Diagnosis present

## 2022-09-26 DIAGNOSIS — Z8249 Family history of ischemic heart disease and other diseases of the circulatory system: Secondary | ICD-10-CM | POA: Diagnosis not present

## 2022-09-26 DIAGNOSIS — T80212A Local infection due to central venous catheter, initial encounter: Secondary | ICD-10-CM

## 2022-09-26 DIAGNOSIS — A419 Sepsis, unspecified organism: Secondary | ICD-10-CM

## 2022-09-26 DIAGNOSIS — Z87442 Personal history of urinary calculi: Secondary | ICD-10-CM

## 2022-09-26 DIAGNOSIS — T80212S Local infection due to central venous catheter, sequela: Secondary | ICD-10-CM | POA: Diagnosis not present

## 2022-09-26 DIAGNOSIS — E782 Mixed hyperlipidemia: Secondary | ICD-10-CM | POA: Diagnosis present

## 2022-09-26 DIAGNOSIS — Z9981 Dependence on supplemental oxygen: Secondary | ICD-10-CM

## 2022-09-26 DIAGNOSIS — R7881 Bacteremia: Secondary | ICD-10-CM | POA: Diagnosis not present

## 2022-09-26 DIAGNOSIS — C9000 Multiple myeloma not having achieved remission: Secondary | ICD-10-CM | POA: Diagnosis present

## 2022-09-26 DIAGNOSIS — Z881 Allergy status to other antibiotic agents status: Secondary | ICD-10-CM

## 2022-09-26 DIAGNOSIS — L03111 Cellulitis of right axilla: Secondary | ICD-10-CM | POA: Diagnosis not present

## 2022-09-26 DIAGNOSIS — Z7969 Long term (current) use of other immunomodulators and immunosuppressants: Secondary | ICD-10-CM

## 2022-09-26 DIAGNOSIS — R918 Other nonspecific abnormal finding of lung field: Secondary | ICD-10-CM | POA: Diagnosis not present

## 2022-09-26 DIAGNOSIS — R531 Weakness: Secondary | ICD-10-CM | POA: Diagnosis not present

## 2022-09-26 DIAGNOSIS — A4101 Sepsis due to Methicillin susceptible Staphylococcus aureus: Principal | ICD-10-CM | POA: Diagnosis present

## 2022-09-26 DIAGNOSIS — M19042 Primary osteoarthritis, left hand: Secondary | ICD-10-CM | POA: Diagnosis present

## 2022-09-26 DIAGNOSIS — L02421 Furuncle of right axilla: Secondary | ICD-10-CM | POA: Diagnosis present

## 2022-09-26 DIAGNOSIS — Z7985 Long-term (current) use of injectable non-insulin antidiabetic drugs: Secondary | ICD-10-CM

## 2022-09-26 DIAGNOSIS — Z833 Family history of diabetes mellitus: Secondary | ICD-10-CM | POA: Diagnosis not present

## 2022-09-26 DIAGNOSIS — B9561 Methicillin susceptible Staphylococcus aureus infection as the cause of diseases classified elsewhere: Principal | ICD-10-CM

## 2022-09-26 DIAGNOSIS — R000 Tachycardia, unspecified: Secondary | ICD-10-CM | POA: Diagnosis not present

## 2022-09-26 DIAGNOSIS — R262 Difficulty in walking, not elsewhere classified: Secondary | ICD-10-CM | POA: Diagnosis present

## 2022-09-26 DIAGNOSIS — Z794 Long term (current) use of insulin: Secondary | ICD-10-CM | POA: Diagnosis not present

## 2022-09-26 DIAGNOSIS — Z825 Family history of asthma and other chronic lower respiratory diseases: Secondary | ICD-10-CM

## 2022-09-26 DIAGNOSIS — Z8701 Personal history of pneumonia (recurrent): Secondary | ICD-10-CM

## 2022-09-26 LAB — URINALYSIS, W/ REFLEX TO CULTURE (INFECTION SUSPECTED)
Bilirubin Urine: NEGATIVE
Glucose, UA: NEGATIVE mg/dL
Ketones, ur: NEGATIVE mg/dL
Leukocytes,Ua: NEGATIVE
Nitrite: NEGATIVE
Protein, ur: NEGATIVE mg/dL
Specific Gravity, Urine: 1.005 (ref 1.005–1.030)
pH: 6 (ref 5.0–8.0)

## 2022-09-26 LAB — COMPREHENSIVE METABOLIC PANEL
ALT: 17 U/L (ref 0–44)
AST: 22 U/L (ref 15–41)
Albumin: 3.1 g/dL — ABNORMAL LOW (ref 3.5–5.0)
Alkaline Phosphatase: 91 U/L (ref 38–126)
Anion gap: 8 (ref 5–15)
BUN: 11 mg/dL (ref 8–23)
CO2: 26 mmol/L (ref 22–32)
Calcium: 8.1 mg/dL — ABNORMAL LOW (ref 8.9–10.3)
Chloride: 99 mmol/L (ref 98–111)
Creatinine, Ser: 0.68 mg/dL (ref 0.44–1.00)
GFR, Estimated: >60 mL/min (ref >60)
Glucose, Bld: 177 mg/dL — ABNORMAL HIGH (ref 70–99)
Potassium: 4.1 mmol/L (ref 3.5–5.1)
Sodium: 133 mmol/L — ABNORMAL LOW (ref 135–145)
Total Bilirubin: 0.8 mg/dL (ref 0.3–1.2)
Total Protein: 5.8 g/dL — ABNORMAL LOW (ref 6.5–8.1)

## 2022-09-26 LAB — CBC WITH DIFFERENTIAL/PLATELET
Abs Immature Granulocytes: 0.05 10*3/uL (ref 0.00–0.07)
Basophils Absolute: 0 10*3/uL (ref 0.0–0.1)
Basophils Relative: 0 %
Eosinophils Absolute: 0 10*3/uL (ref 0.0–0.5)
Eosinophils Relative: 0 %
HCT: 32.1 % — ABNORMAL LOW (ref 36.0–46.0)
Hemoglobin: 10.2 g/dL — ABNORMAL LOW (ref 12.0–15.0)
Immature Granulocytes: 1 %
Lymphocytes Relative: 7 %
Lymphs Abs: 0.3 10*3/uL — ABNORMAL LOW (ref 0.7–4.0)
MCH: 31.4 pg (ref 26.0–34.0)
MCHC: 31.8 g/dL (ref 30.0–36.0)
MCV: 98.8 fL (ref 80.0–100.0)
Monocytes Absolute: 0.6 10*3/uL (ref 0.1–1.0)
Monocytes Relative: 12 %
Neutro Abs: 4 10*3/uL (ref 1.7–7.7)
Neutrophils Relative %: 80 %
Platelets: 89 10*3/uL — ABNORMAL LOW (ref 150–400)
RBC: 3.25 MIL/uL — ABNORMAL LOW (ref 3.87–5.11)
RDW: 17 % — ABNORMAL HIGH (ref 11.5–15.5)
WBC: 4.9 10*3/uL (ref 4.0–10.5)
nRBC: 0 % (ref 0.0–0.2)

## 2022-09-26 LAB — PROTIME-INR
INR: 1.1 (ref 0.8–1.2)
Prothrombin Time: 14.6 seconds (ref 11.4–15.2)

## 2022-09-26 LAB — HEMOGLOBIN A1C
Hgb A1c MFr Bld: 4.7 % — ABNORMAL LOW (ref 4.8–5.6)
Mean Plasma Glucose: 88.19 mg/dL

## 2022-09-26 LAB — RESP PANEL BY RT-PCR (RSV, FLU A&B, COVID)  RVPGX2
Influenza A by PCR: NEGATIVE
Influenza B by PCR: NEGATIVE
Resp Syncytial Virus by PCR: NEGATIVE
SARS Coronavirus 2 by RT PCR: NEGATIVE

## 2022-09-26 LAB — LACTIC ACID, PLASMA
Lactic Acid, Venous: 2 mmol/L (ref 0.5–1.9)
Lactic Acid, Venous: 1.2 mmol/L (ref 0.5–1.9)

## 2022-09-26 LAB — PROCALCITONIN: Procalcitonin: <0.1 ng/mL

## 2022-09-26 LAB — BRAIN NATRIURETIC PEPTIDE: B Natriuretic Peptide: 99 pg/mL (ref 0.0–100.0)

## 2022-09-26 LAB — GLUCOSE, CAPILLARY
Glucose-Capillary: 137 mg/dL — ABNORMAL HIGH (ref 70–99)
Glucose-Capillary: 126 mg/dL — ABNORMAL HIGH (ref 70–99)

## 2022-09-26 LAB — APTT: aPTT: 27 seconds (ref 24–36)

## 2022-09-26 MED ORDER — INSULIN ASPART 100 UNIT/ML IJ SOLN
0.0000 [IU] | Freq: Three times a day (TID) | INTRAMUSCULAR | Status: DC
  Administered 2022-09-26 – 2022-10-03 (×5): 1 [IU] via SUBCUTANEOUS

## 2022-09-26 MED ORDER — FUROSEMIDE 20 MG PO TABS
20.0000 mg | ORAL_TABLET | Freq: Two times a day (BID) | ORAL | Status: DC
  Administered 2022-09-27: 20 mg via ORAL
  Filled 2022-09-26: qty 1

## 2022-09-26 MED ORDER — ACETAMINOPHEN 325 MG PO TABS
650.0000 mg | ORAL_TABLET | Freq: Once | ORAL | Status: AC | PRN
  Administered 2022-09-26: 650 mg via ORAL
  Filled 2022-09-26: qty 2

## 2022-09-26 MED ORDER — KETOROLAC TROMETHAMINE 15 MG/ML IJ SOLN
15.0000 mg | Freq: Once | INTRAMUSCULAR | Status: AC
  Administered 2022-09-27: 15 mg via INTRAVENOUS
  Filled 2022-09-26: qty 1

## 2022-09-26 MED ORDER — LACTATED RINGERS IV BOLUS (SEPSIS)
1000.0000 mL | Freq: Once | INTRAVENOUS | Status: AC
  Administered 2022-09-26: 1000 mL via INTRAVENOUS

## 2022-09-26 MED ORDER — ACETAMINOPHEN 325 MG PO TABS
650.0000 mg | ORAL_TABLET | Freq: Four times a day (QID) | ORAL | Status: DC | PRN
  Administered 2022-09-26 – 2022-09-27 (×5): 650 mg via ORAL
  Filled 2022-09-26 (×5): qty 2

## 2022-09-26 MED ORDER — OXYCODONE HCL 5 MG PO TABS
5.0000 mg | ORAL_TABLET | Freq: Four times a day (QID) | ORAL | Status: DC | PRN
  Administered 2022-09-26: 5 mg via ORAL
  Filled 2022-09-26: qty 1

## 2022-09-26 MED ORDER — VANCOMYCIN HCL IN DEXTROSE 1-5 GM/200ML-% IV SOLN: 1000.0000 mg | Freq: Once | INTRAVENOUS | Status: DC

## 2022-09-26 MED ORDER — ACETAMINOPHEN 650 MG RE SUPP: 650.0000 mg | Freq: Four times a day (QID) | RECTAL | Status: DC | PRN

## 2022-09-26 MED ORDER — VANCOMYCIN HCL 2000 MG/400ML IV SOLN
2000.0000 mg | Freq: Once | INTRAVENOUS | Status: AC
  Administered 2022-09-26: 2000 mg via INTRAVENOUS
  Filled 2022-09-26: qty 400

## 2022-09-26 MED ORDER — ONDANSETRON HCL 4 MG PO TABS: 4.0000 mg | ORAL_TABLET | Freq: Four times a day (QID) | ORAL | Status: DC | PRN

## 2022-09-26 MED ORDER — ACYCLOVIR 800 MG PO TABS
400.0000 mg | ORAL_TABLET | Freq: Two times a day (BID) | ORAL | Status: DC
  Administered 2022-09-26 – 2022-10-03 (×14): 400 mg via ORAL
  Filled 2022-09-26 (×14): qty 1

## 2022-09-26 MED ORDER — PANTOPRAZOLE SODIUM 40 MG PO TBEC
40.0000 mg | DELAYED_RELEASE_TABLET | Freq: Every day | ORAL | Status: DC
  Administered 2022-09-26 – 2022-10-03 (×8): 40 mg via ORAL
  Filled 2022-09-26 (×8): qty 1

## 2022-09-26 MED ORDER — LACTATED RINGERS IV SOLN: INTRAVENOUS | Status: DC

## 2022-09-26 MED ORDER — VENLAFAXINE HCL ER 75 MG PO CP24
150.0000 mg | ORAL_CAPSULE | Freq: Every day | ORAL | Status: DC
  Administered 2022-09-27 – 2022-10-03 (×7): 150 mg via ORAL
  Filled 2022-09-26 (×7): qty 2

## 2022-09-26 MED ORDER — PRAVASTATIN SODIUM 10 MG PO TABS
20.0000 mg | ORAL_TABLET | Freq: Every day | ORAL | Status: DC
  Administered 2022-09-26 – 2022-10-02 (×7): 20 mg via ORAL
  Filled 2022-09-26 (×8): qty 2

## 2022-09-26 MED ORDER — OXYCODONE-ACETAMINOPHEN 5-325 MG PO TABS
1.0000 | ORAL_TABLET | Freq: Four times a day (QID) | ORAL | Status: DC | PRN
  Administered 2022-09-29: 1 via ORAL
  Filled 2022-09-26: qty 1

## 2022-09-26 MED ORDER — FUROSEMIDE 20 MG PO TABS: 20.0000 mg | ORAL_TABLET | Freq: Two times a day (BID) | ORAL | Status: DC

## 2022-09-26 MED ORDER — FUROSEMIDE 10 MG/ML IJ SOLN
40.0000 mg | Freq: Once | INTRAMUSCULAR | Status: AC
  Administered 2022-09-26: 40 mg via INTRAVENOUS
  Filled 2022-09-26: qty 4

## 2022-09-26 MED ORDER — VANCOMYCIN HCL 1750 MG/350ML IV SOLN
1750.0000 mg | INTRAVENOUS | Status: DC
  Filled 2022-09-26 (×2): qty 350

## 2022-09-26 MED ORDER — ONDANSETRON HCL 4 MG/2ML IJ SOLN: 4.0000 mg | Freq: Four times a day (QID) | INTRAMUSCULAR | Status: DC | PRN

## 2022-09-26 MED ORDER — IOHEXOL 300 MG/ML  SOLN
75.0000 mL | Freq: Once | INTRAMUSCULAR | Status: AC | PRN
  Administered 2022-09-26: 75 mL via INTRAVENOUS

## 2022-09-26 MED ORDER — POTASSIUM CHLORIDE IN NACL 20-0.9 MEQ/L-% IV SOLN: INTRAVENOUS | Status: AC

## 2022-09-26 NOTE — ED Notes (Signed)
Handoff Report given to mallory on 3rd floor

## 2022-09-26 NOTE — Progress Notes (Signed)
Patient arrived to room 341. She is alert and oriented x4. Yellow mews triggered for temp 101.1 and RR 24. Notified Jiles Crocker, CRN and Dr. Arbutus Leas. No new orders. Yellow mews protocol initiated.    09/26/22 1559  Assess: MEWS Score  Temp (!) 101.1 F (38.4 C)  BP (!) 160/132  MAP (mmHg) 141  Pulse Rate 100  Resp (!) 24  Level of Consciousness Alert  SpO2 90 %  Assess: MEWS Score  MEWS Temp 1  MEWS Systolic 0  MEWS Pulse 0  MEWS RR 1  MEWS LOC 0  MEWS Score 2  MEWS Score Color Yellow  Assess: if the MEWS score is Yellow or Red  Were vital signs taken at a resting state? Yes  Focused Assessment No change from prior assessment  Does the patient meet 2 or more of the SIRS criteria? Yes  Does the patient have a confirmed or suspected source of infection? Yes  MEWS guidelines implemented  Yes, yellow  Treat  MEWS Interventions Considered administering scheduled or prn medications/treatments as ordered  Take Vital Signs  Increase Vital Sign Frequency  Yellow: Q2hr x1, continue Q4hrs until patient remains green for 12hrs  Escalate  MEWS: Escalate Yellow: Discuss with charge nurse and consider notifying provider and/or RRT  Notify: Charge Nurse/RN  Name of Charge Nurse/RN Notified Jiles Crocker, RN  Provider Notification  Provider Name/Title Dr. Arbutus Leas  Date Provider Notified 09/26/22  Time Provider Notified 1621  Method of Notification Page  Notification Reason Other (Comment) (yellow mews)  Provider response No new orders  Date of Provider Response 09/26/22  Time of Provider Response 1632  Assess: SIRS CRITERIA  SIRS Temperature  1  SIRS Pulse 1  SIRS Respirations  1  SIRS WBC 0  SIRS Score Sum  3

## 2022-09-26 NOTE — ED Provider Notes (Signed)
[facility] MEDICAL SURGICAL UNIT Provider Note  CSN: [id number] Arrival date & time: 09/26/22 1113  Chief Complaint(s) Abscess  HPI Savannah Burns is a 72 y.o. female with past medical history as below, significant for DM 2, diastolic heart failure, HLD, HTN COPD, multiple myeloma ([place]), iron deficiency anemia (Dr Ellin Saba) who presents to the ED with complaint of rash/abscess.  Patient with rash to right axilla.  She has been treated with doxycycline and Keflex without improvement to her wound.  Worsening redness and drainage from her right axilla.  This morning she was unable get out of bed due to extreme fatigue chills, fevers.  No significant nausea or vomiting.  No change in bowel or bladder function.  No chest pain or dyspnea.  Past Medical History Past Medical History:  Diagnosis Date   Adrenal adenoma, left    Stable   Anxiety    Arthritis    bilateral hands   Depression    Diabetes mellitus, type 2 (HCC) 08/12/2008   Qualifier: Diagnosis of  By: Dayton Martes MD, Talia     Dyspnea    Esophageal varices (HCC)    Grade II diastolic dysfunction    History of kidney stones    Hyperlipidemia    Hypertension    Lower back pain    Lower GI bleed 03/19/2020   Panic attacks    Pneumonia    currently taking antibiotic and prednisone for early stages of pneumonia   Pulmonary nodules    bilateral   Skin cancer    face   Patient Active Problem List   Diagnosis Date Noted   Sepsis due to undetermined organism (HCC) 09/26/2022   Obesity, Class III, BMI 40-49.9 (morbid obesity) (HCC) 09/26/2022   Chronic respiratory failure with hypoxia (HCC) 09/26/2022   Thrombocytopenia (HCC) 09/26/2022   Cellulitis of axilla, right 09/26/2022   Sepsis (HCC) 09/26/2022   Diabetic peripheral neuropathy associated with type 2 diabetes mellitus (HCC) 02/25/2022   Multiple myeloma (HCC) 11/30/2021   H. pylori infection 08/12/2021   Cirrhosis of liver (HCC) 08/04/2021   Hypoalbuminemia  05/17/2020   Moderate protein malnutrition (HCC) 05/17/2020   Melena 05/16/2020   Hyperlipidemia    Hypokalemia    GI bleed 03/20/2020   Lower GI bleed 03/19/2020   Acute on chronic congestive heart failure (HCC)    Symptomatic anemia    Iron deficiency anemia    Heme positive stool    ABLA (acute blood loss anemia) 01/22/2020   Supplemental oxygen dependent 10/22/2019   Chronic dyspnea 10/22/2019   Hypertension associated with diabetes (HCC) 10/22/2019   Bilateral hand pain 09/21/2016   Fatigue 07/30/2015   Chronic diastolic HF (heart failure) (HCC) 06/29/2015   Postmenopausal bleeding 04/17/2015   Excessive daytime sleepiness 12/19/2014   Swelling of lower extremity 11/27/2014   Back pain 06/13/2013   Sinusitis, chronic 05/30/2012   Allergic rhinitis 06/06/2011   Hyperlipidemia associated with type 2 diabetes mellitus (HCC) 03/24/2009   Diabetes mellitus, type 2 (HCC) 08/12/2008   Pulmonary nodule 08/29/2007   COPD (chronic obstructive pulmonary disease) (HCC) 02/14/2007   Class 3 obesity (HCC) 05/25/2006   Depression, recurrent (HCC) 05/25/2006   HYPERTENSION, BENIGN SYSTEMIC 05/25/2006   DJD, UNSPECIFIED 05/25/2006   Home Medication(s) Prior to Admission medications   Medication Sig Start Date End Date Taking? Authorizing Provider  doxycycline (VIBRA-TABS) 100 MG tablet Take 1 tablet (100 mg total) by mouth 2 (two) times daily for 7 days. 09/20/22 09/27/22  Doreatha Massed, MD  acyclovir (  ZOVIRAX) 400 MG tablet Take 1 tablet (400 mg total) by mouth 2 (two) times daily. 11/30/21   Doreatha Massed, MD  Alpha-Lipoic Acid 600 MG CAPS Take 1 capsule (600 mg total) by mouth daily. For neuropathy 06/08/22   Delynn Flavin M, DO  bacitracin ointment Apply 1 Application topically 2 (two) times daily. 05/23/22   Jeanelle Malling, PA  baclofen (LIORESAL) 10 MG tablet Take 10 mg by mouth 2 (two) times daily. 03/03/22   [provider]  Blood Glucose Calibration (TRUE METRIX  LEVEL 1) Low SOLN Use w/ glucose monitor Dx E11.9 09/01/21   Delynn Flavin M, DO  Blood Glucose Monitoring Suppl (TRUE METRIX AIR GLUCOSE METER) w/Device KIT Check BS daily Dx E11.9 09/01/21   Delynn Flavin M, DO  carvedilol (COREG) 6.25 MG tablet Take 6.25 mg by mouth 2 (two) times daily. 11/10/21   [provider]  Cholecalciferol (VITAMIN D) 50 MCG (2000 UT) tablet Take 2,000 Units by mouth daily.    [provider]  clobetasol (TEMOVATE) 0.05 % external solution Apply topically. 01/24/22   [provider]  CONSTULOSE 10 GM/15ML solution Take by mouth. 11/10/21   [provider]  Cyanocobalamin (B-12 COMPLIANCE INJECTION) 1000 MCG/ML KIT Inject 1,000 mcg as directed every 30 (thirty) days.    [provider]  daratumumab-hyaluronidase-fihj (DARZALEX FASPRO) 1800-30000 MG-UT/15ML SOLN Inject 1,800 mg into the skin once a week. 12/14/21   [provider]  desvenlafaxine (PRISTIQ) 100 MG 24 hr tablet Take 1 tablet (100 mg total) by mouth daily. 02/25/22   Raliegh Ip, DO  dexamethasone (DECADRON) 4 MG tablet Take 5 tablets (20 mg total) by mouth once a week. 05/24/22   Doreatha Massed, MD  esomeprazole (NEXIUM) 20 MG capsule Take 1 capsule (20 mg total) by mouth daily at 12 noon. 12/09/20   Raliegh Ip, DO  ferrous sulfate 325 (65 FE) MG tablet Take 1 tablet (325 mg total) by mouth daily with breakfast. 02/22/22 06/22/22  Doreatha Massed, MD  fluticasone (CUTIVATE) 0.05 % cream Apply 1 Application topically daily. 01/24/22   [provider]  furosemide (LASIX) 20 MG tablet Take 20 mg by mouth. bid    [provider]  glucose blood (TRUE METRIX BLOOD GLUCOSE TEST) test strip Check BS daily Dx E11.9 09/01/21   Delynn Flavin M, DO  lenalidomide (REVLIMID) 15 MG capsule TAKE 1 CAPSULE BY MOUTH EVERY DAY FOR 21 DAYS ON THEN 7 DAYS OFF 08/24/22   Doreatha Massed, MD  lidocaine-prilocaine (EMLA) cream Apply  1 Application topically as needed. Apply a small amount to port a cath site and cover with plastic wrap 1 hour prior to infusion appointments 06/21/22   Doreatha Massed, MD  lovastatin (MEVACOR) 20 MG tablet Take 1 tablet (20 mg total) by mouth at bedtime. 10/30/19   Raliegh Ip, DO  omeprazole (PRILOSEC) 20 MG capsule Take 20 mg by mouth 2 (two) times daily. 08/16/21   [provider]  oxyCODONE-acetaminophen (PERCOCET) 10-325 MG tablet Take 1 tablet by mouth every 6 (six) hours as needed for pain.    [provider]  OXYGEN Inhale 3 L into the lungs continuous.    [provider]  OZEMPIC, 0.25 OR 0.5 MG/DOSE, 2 MG/3ML SOPN Inject into the skin. 12/11/21   [provider]  potassium chloride SA (KLOR-CON) 20 MEQ tablet Take 2 tablets (40 mEq total) by mouth 2 (two) times daily. Patient taking differently: Take 40 mEq by mouth daily. 04/24/20  Delynn Flavin M, DO  prochlorperazine (COMPAZINE) 10 MG tablet Take 1 tablet (10 mg total) by mouth every 6 (six) hours as needed for nausea or vomiting. 12/14/21   Doreatha Massed, MD  torsemide (DEMADEX) 20 MG tablet Take 3 tablets (60 mg total) by mouth 2 (two) times daily. Patient taking differently: Take 50 mg by mouth 2 (two) times daily. 02/18/20 05/06/22  Dyann Kief, PA-C  TRUEplus Lancets 33G MISC Check BS daily Dx E11.9 09/01/21   Raliegh Ip, DO  albuterol (PROVENTIL HFA;VENTOLIN HFA) 108 (90 Base) MCG/ACT inhaler Inhale 2 puffs into the lungs every 6 (six) hours as needed for wheezing or shortness of breath. 09/21/16 09/22/16  Pincus Large, DO                                                                                                                                    Past Surgical History Past Surgical History:  Procedure Laterality Date   BIOPSY  04/07/2020   Procedure: BIOPSY;  Surgeon: Lanelle Bal, DO;  [facility]: [facility];  Service: Endoscopy;;   Breast Cystectomy   Right    CESAREAN SECTION     COLONOSCOPY WITH PROPOFOL N/A 01/25/2020   Dr. Marletta Lor: Nonbleeding internal hemorrhoids, diverticulosis, 5 mm polyp removed from the ascending colon, 10 mm polyp removed from the sigmoid colon, 30 mm polyp (tubulovillous adenoma with no high-grade dysplasia) removed from the transverse colon via piecemeal status post tattoo.  Other polyps were tubular adenomas.  3 month surveillance colonoscopy recommended.   COLONOSCOPY WITH PROPOFOL N/A 04/07/2020   Procedure: COLONOSCOPY WITH PROPOFOL;  Surgeon: Lanelle Bal, DO;  Location: [facility];  Service: Endoscopy;  Laterality: N/A;  3:00pm, pt knows [place] time per office   CYSTOSCOPY/URETEROSCOPY/HOLMIUM LASER/STENT PLACEMENT Bilateral 03/01/2019   Procedure: CYSTOSCOPY/RETROGRADEURETEROSCOPY/HOLMIUM LASER/STENT PLACEMENT;  Surgeon: Rene Paci, MD;  Location: [facility];  Service: Urology;  Laterality: Bilateral;  ONLY NEEDS 60 MIN   ESOPHAGOGASTRODUODENOSCOPY (EGD) WITH PROPOFOL N/A 01/25/2020   Dr. Marletta Lor: 4 columns grade 1 esophageal varices   ESOPHAGOGASTRODUODENOSCOPY (EGD) WITH PROPOFOL N/A 05/18/2020   Procedure: ESOPHAGOGASTRODUODENOSCOPY (EGD) WITH PROPOFOL;  Surgeon: Lanelle Bal, DO;  [facility]: [facility];  Service: Endoscopy;  Laterality: N/A;   ESOPHAGOGASTRODUODENOSCOPY (EGD) WITH PROPOFOL N/A 07/28/2020   Procedure: ESOPHAGOGASTRODUODENOSCOPY (EGD) WITH PROPOFOL;  Surgeon: Lanelle Bal, DO;  [facility]: [facility];  Service: Endoscopy;  Laterality: N/A;  am or early PM due to givens capsule placement   GIVENS CAPSULE STUDY N/A 05/18/2020   Procedure: GIVENS CAPSULE STUDY;  Surgeon: Dolores Frame, MD;  [facility]: [facility];  Service: Gastroenterology;  Laterality: N/A;   GIVENS CAPSULE STUDY N/A 07/28/2020   Procedure: GIVENS CAPSULE STUDY;  Surgeon: Lanelle Bal, DO;  [facility]: [facility];  Service: Endoscopy;  Laterality: N/A;   IR IMAGING GUIDED PORT INSERTION   12/02/2021   POLYPECTOMY  01/25/2020   Procedure: POLYPECTOMY;  Surgeon: Lanelle Bal, DO;  [facility];  Service: Endoscopy;;   POLYPECTOMY  04/07/2020   Procedure: POLYPECTOMY INTESTINAL;  Surgeon: Lanelle Bal, DO;  [facility];  Service: Endoscopy;;   SKIN CANCER EXCISION     Face   SPINE SURGERY     SUBMUCOSAL TATTOO INJECTION  01/25/2020   Procedure: SUBMUCOSAL TATTOO INJECTION;  Surgeon: Lanelle Bal, DO;  [facility];  Service: Endoscopy;;   Family History Family History  Problem Relation Age of Onset   Diabetes Father    Heart disease Father 80       MI   Hypertension Father    Anemia Mother        Transfusion dependent   COPD Sister    Cancer Paternal Grandmother 23       Pancreatic    Social History Social History   Tobacco Use   Smoking status: Former    Packs/day: 1.50    Years: 40.00    Additional pack years: 0.00    Total pack years: 60.00    Types: Cigarettes    Quit date: 04/29/2015    Years since quitting: 7.4   Smokeless tobacco: Never   Tobacco comments:    Quit smoking 04/2015- Previous 1.5 ppd smoker  Vaping Use   Vaping Use: Never used  Substance Use Topics   Alcohol use: No    Alcohol/week: 0.0 standard drinks of alcohol   Drug use: No   Allergies Keflex [cephalexin]  Review of Systems Review of Systems  Constitutional:  Positive for chills, fatigue and fever. Negative for activity change.  HENT:  Negative for facial swelling and trouble swallowing.   Eyes:  Negative for discharge and redness.  Respiratory:  Negative for cough and shortness of breath.   Cardiovascular:  Negative for chest pain and palpitations.  Gastrointestinal:  Negative for abdominal pain and nausea.  Genitourinary:  Negative for dysuria and flank pain.  Musculoskeletal:  Negative for back pain and gait problem.  Skin:  Positive for rash and wound. Negative for pallor.  Neurological:  Negative for syncope and  headaches.    Physical Exam Vital Signs  I have reviewed the triage vital signs BP (!) 148/94 (BP Location: Right Wrist)   Pulse 82   Temp 98 F (36.7 C)   Resp 20   Ht 5\' 2"  (1.575 m)   Wt 127 kg   SpO2 100%   BMI 51.21 kg/m  Physical Exam Vitals and nursing note reviewed. Exam conducted with a chaperone present.  Constitutional:      General: She is not in acute distress.    Appearance: She is diaphoretic.     Comments: rigors  HENT:     Head: Normocephalic and atraumatic.     Right Ear: External ear normal.     Left Ear: External ear normal.     Nose: Nose normal.     Mouth/Throat:     Mouth: Mucous membranes are moist.  Eyes:     General: No scleral icterus.       Right eye: No discharge.        Left eye: No discharge.  Cardiovascular:     Rate and Rhythm: Normal rate and regular rhythm.     Pulses: Normal pulses.     Heart sounds: Normal heart sounds.  Pulmonary:     Effort: Pulmonary effort is normal. No respiratory distress.  Breath sounds: Normal breath sounds.  Chest:    Abdominal:     General: Abdomen is flat.     Palpations: Abdomen is soft.     Tenderness: There is no abdominal tenderness.  Musculoskeletal:     Right lower leg: No edema.     Left lower leg: No edema.  Skin:    General: Skin is warm.     Capillary Refill: Capillary refill takes less than 2 seconds.       Neurological:     Mental Status: She is alert.  Psychiatric:        Mood and Affect: Mood normal.        Behavior: Behavior normal.     ED Results and Treatments Labs (all labs ordered are listed, but only abnormal results are displayed) Labs Reviewed  BLOOD CULTURE ID PANEL (REFLEXED) - BCID2 - Abnormal; Notable for the following components:      Result Value   Staphylococcus species DETECTED (*)    Staphylococcus aureus (BCID) DETECTED (*)    All other components within normal limits  LACTIC ACID, PLASMA - Abnormal; Notable for the following components:   Lactic  Acid, Venous 2.0 (*)    All other components within normal limits  COMPREHENSIVE METABOLIC PANEL - Abnormal; Notable for the following components:   Sodium 133 (*)    Glucose, Bld 177 (*)    Calcium 8.1 (*)    Total Protein 5.8 (*)    Albumin 3.1 (*)    All other components within normal limits  CBC WITH DIFFERENTIAL/PLATELET - Abnormal; Notable for the following components:   RBC 3.25 (*)    Hemoglobin 10.2 (*)    HCT 32.1 (*)    RDW 17.0 (*)    Platelets 89 (*)    Lymphs Abs 0.3 (*)    All other components within normal limits  URINALYSIS, W/ REFLEX TO CULTURE (INFECTION SUSPECTED) - Abnormal; Notable for the following components:   Color, Urine STRAW (*)    Hgb urine dipstick SMALL (*)    Bacteria, UA MANY (*)    All other components within normal limits  HEMOGLOBIN A1C - Abnormal; Notable for the following components:   Hgb A1c MFr Bld 4.7 (*)    All other components within normal limits  BASIC METABOLIC PANEL - Abnormal; Notable for the following components:   Glucose, Bld 117 (*)    Calcium 8.1 (*)    All other components within normal limits  CBC - Abnormal; Notable for the following components:   RBC 3.21 (*)    Hemoglobin 10.0 (*)    HCT 31.9 (*)    RDW 17.1 (*)    Platelets 93 (*)    All other components within normal limits  GLUCOSE, CAPILLARY - Abnormal; Notable for the following components:   Glucose-Capillary 137 (*)    All other components within normal limits  GLUCOSE, CAPILLARY - Abnormal; Notable for the following components:   Glucose-Capillary 126 (*)    All other components within normal limits  RESP PANEL BY RT-PCR (RSV, FLU A&B, COVID)  RVPGX2  CULTURE, BLOOD (ROUTINE X 2)  CULTURE, BLOOD (ROUTINE X 2)  LACTIC ACID, PLASMA  PROTIME-INR  APTT  PROCALCITONIN  BRAIN NATRIURETIC PEPTIDE  MAGNESIUM  Radiology CT CHEST W  CONTRAST  Result Date: 09/26/2022 CLINICAL DATA:  Soft tissue infection involving the right axilla and right flank EXAM: CT CHEST WITH CONTRAST TECHNIQUE: Multidetector CT imaging of the chest was performed during intravenous contrast administration. RADIATION DOSE REDUCTION: This exam was performed according to the departmental dose-optimization program which includes automated exposure control, adjustment of the mA and/or kV according to patient size and/or use of iterative reconstruction technique. CONTRAST:  75mL OMNIPAQUE IOHEXOL 300 MG/ML  SOLN COMPARISON:  PET-CT dated 11/18/2021 FINDINGS: Cardiovascular: The heart is normal in size. No pericardial effusion. No evidence of thoracic aortic aneurysm. Atherosclerotic calcifications of the aortic arch. Moderate three-vessel coronary atherosclerosis. Right chest port terminates at the cavoatrial junction. Mediastinum/Nodes: No suspicious mediastinal lymphadenopathy. Visualized thyroid is unremarkable. Lungs/Pleura: Evaluation lung parenchyma is constrained by respiratory motion. Within that constraint, there are patchy/ground-glass opacities in the lateral upper lobes (series 5/image 50), posterior right upper lobe along the major fissure (series 5/image 54) and anterior might middle lobe adjacent to the minor fissure (series 5/image 69) which are [place] from the prior and favor mild infection/pneumonia. Mild subpleural scarring/atelectasis in the lingula, chronic. Mild dependent atelectasis in the bilateral lower lobes. Numerous hyperdense foci in the posterior lower lobes, favored sequela of prior aspiration. No pleural effusion or pneumothorax. Upper Abdomen: Visualized upper abdomen is notable for a nodular hepatic border, suggesting cirrhosis. Stable 3.2 cm left adrenal nodule, without hypermetabolism on PET, favoring a benign adrenal adenoma. Musculoskeletal: Cutaneous thickening along the right lateral chest wall ((series 2/image 81) with mild subcutaneous  extending to the right axilla, where there is focal subcutaneous stranding/inflammation (series 2/image 44), suggesting cellulitis. However, there is no drainable fluid collection/abscess. Degenerative changes of the visualized thoracolumbar spine. No focal lytic lesions in this patient with history of multiple myeloma. IMPRESSION: Suspected cellulitis along the right axilla/lateral chest wall. No drainable fluid collection/abscess. Scattered mild patchy/ground-glass opacities in the lungs bilaterally, as described above, [place] from prior PET. Although poorly evaluated due to motion degradation, this appearance favors mild infection/pneumonia. Additional stable ancillary findings as above. Aortic Atherosclerosis (ICD10-I70.0). Electronically Signed   By: Charline Bills M.D.   On: 09/26/2022 22:00   DG Chest Port 1 View  Result Date: 09/26/2022 CLINICAL DATA:  Sepsis EXAM: PORTABLE CHEST 1 VIEW COMPARISON:  X-ray 05/19/2020 FINDINGS: Enlarged cardiopericardial silhouette. Calcified aorta. Stable interstitial changes. No pneumothorax, effusion or consolidation. Right IJ chest port in place with tip at the SVC right atrial junction. Overlapping cardiac leads. Slight left basilar scar or atelectasis. Osteopenia and degenerative changes. IMPRESSION: Slight left basilar scar or atelectasis. Enlarged heart with calcified aorta.  Chest port Electronically Signed   By: Karen Kays M.D.   On: 09/26/2022 12:29    Pertinent labs & imaging results that were available during my care of the patient were reviewed by me and considered in my medical decision making (see MDM for details).  Medications Ordered in ED Medications  acyclovir (ZOVIRAX) tablet 400 mg (400 mg Oral Given 09/26/22 2215)  pravastatin (PRAVACHOL) tablet 20 mg (20 mg Oral Given 09/26/22 1736)  venlafaxine XR (EFFEXOR-XR) 24 hr capsule 150 mg (has no administration in time range)  pantoprazole (PROTONIX) EC tablet 40 mg (40 mg Oral Given 09/26/22 1736)   acetaminophen (TYLENOL) tablet 650 mg (650 mg Oral Given 09/26/22 2209)    Or  acetaminophen (TYLENOL) suppository 650 mg ( Rectal See Alternative 09/26/22 2209)  ondansetron (ZOFRAN) tablet 4 mg (has no administration in time range)  Or  ondansetron (ZOFRAN) injection 4 mg (has no administration in time range)  0.9 % NaCl with KCl 20 mEq/ L  infusion ( Intravenous [place] Bag/Given 09/26/22 1714)  insulin aspart (novoLOG) injection 0-9 Units (1 Units Subcutaneous Given 09/26/22 1735)  furosemide (LASIX) tablet 20 mg (has no administration in time range)  oxyCODONE-acetaminophen (PERCOCET/ROXICET) 5-325 MG per tablet 1 tablet (has no administration in time range)    And  oxyCODONE (Oxy IR/ROXICODONE) immediate release tablet 5 mg (5 mg Oral Given 09/26/22 1650)  ceFAZolin (ANCEF) IVPB 2g/100 mL premix (has no administration in time range)  lactated ringers bolus 1,000 mL (0 mLs Intravenous Stopped 09/26/22 1311)  acetaminophen (TYLENOL) tablet 650 mg (650 mg Oral Given 09/26/22 1200)  vancomycin (VANCOREADY) IVPB 2000 mg/400 mL (0 mg Intravenous Stopped 09/26/22 1503)  furosemide (LASIX) injection 40 mg (40 mg Intravenous Given 09/26/22 1737)  iohexol (OMNIPAQUE) 300 MG/ML solution 75 mL (75 mLs Intravenous Contrast Given 09/26/22 1917)  ketorolac (TORADOL) 15 MG/ML injection 15 mg (15 mg Intravenous Given 09/27/22 0029)                                                                                                                                     Procedures .Critical Care  Performed by: Sloan Leiter, DO Authorized by: Sloan Leiter, DO   Critical care provider statement:    Critical care time (minutes):  32   Critical care time was exclusive of:  Separately billable procedures and treating other patients   Critical care was necessary to treat or prevent imminent or life-threatening deterioration of the following conditions:  Sepsis   Critical care was time spent personally by me on the following  activities:  Development of treatment plan with patient or surrogate, discussions with consultants, evaluation of patient's response to treatment, examination of patient, ordering and review of laboratory studies, ordering and review of radiographic studies, ordering and performing treatments and interventions, pulse oximetry, re-evaluation of patient's condition, review of old charts and obtaining history from patient or surrogate   Care discussed with: admitting provider     (including critical care time)  Medical Decision Making / ED Course    Medical Decision Making:    Savannah Burns is a 72 y.o. female with past medical history as below, significant for DM 2, diastolic heart failure, HLD, HTN COPD, multiple myeloma, iron deficiency anemia (Dr Ellin Saba) who presents to the ED with complaint of rash/abscess.. The complaint involves an extensive differential diagnosis and also carries with it a high risk of complications and morbidity.  Serious etiology was considered. Ddx includes but is not limited to:  Differential diagnosis for adult fever includes but is not exclusive to community-acquired pneumonia, urinary tract infection, acute cholecystitis, viral syndrome, cellulitis, tick bourne disease,  decubitus ulcer, necrotizing fasciitis, meningitis, encephalitis, influenza, etc.   Complete initial physical exam performed, notably the patient  was febrile 102.6,  rigors, hemodynamic stable.    Reviewed and confirmed nursing documentation for past medical history, family history, social history.  Vital signs reviewed.    Clinical Course as of 09/27/22 0742  Mon Sep 26, 2022  1438 Hemoglobin(!): 10.2 Similar to her baseline [SG]  1438 Lactic Acid, Venous(!!): 2.0 Give fluids and repeat [SG]    Clinical Course User Index [SG] Sloan Leiter, DO   Patient with tachypnea, elevated heart rate, fever on arrival.  Concern for soft tissue infection. Code sepsis alerted. Send blood  cultures, start abx Patient appears to have failed outpatient oral antibiotic treatment for her cellulitis of her right axilla.  Vancomycin was started.  Recommend admission for sepsis, failure of outpatient antibiotics Started vancomycin She is HDS   Admitted [facility]   Additional history obtained: -Additional history obtained from na -External records from outside source obtained and reviewed including: Chart review including previous notes, labs, imaging, consultation notes including primary care documentation, home medications, prior labs and imaging   Lab Tests: -I ordered, reviewed, and interpreted labs.   The pertinent results include:   Labs Reviewed  BLOOD CULTURE ID PANEL (REFLEXED) - BCID2 - Abnormal; Notable for the following components:      Result Value   Staphylococcus species DETECTED (*)    Staphylococcus aureus (BCID) DETECTED (*)    All other components within normal limits  LACTIC ACID, PLASMA - Abnormal; Notable for the following components:   Lactic Acid, Venous 2.0 (*)    All other components within normal limits  COMPREHENSIVE METABOLIC PANEL - Abnormal; Notable for the following components:   Sodium 133 (*)    Glucose, Bld 177 (*)    Calcium 8.1 (*)    Total Protein 5.8 (*)    Albumin 3.1 (*)    All other components within normal limits  CBC WITH DIFFERENTIAL/PLATELET - Abnormal; Notable for the following components:   RBC 3.25 (*)    Hemoglobin 10.2 (*)    HCT 32.1 (*)    RDW 17.0 (*)    Platelets 89 (*)    Lymphs Abs 0.3 (*)    All other components within normal limits  URINALYSIS, W/ REFLEX TO CULTURE (INFECTION SUSPECTED) - Abnormal; Notable for the following components:   Color, Urine STRAW (*)    Hgb urine dipstick SMALL (*)    Bacteria, UA MANY (*)    All other components within normal limits  HEMOGLOBIN A1C - Abnormal; Notable for the following components:   Hgb A1c MFr Bld 4.7 (*)    All other components within normal limits  BASIC  METABOLIC PANEL - Abnormal; Notable for the following components:   Glucose, Bld 117 (*)    Calcium 8.1 (*)    All other components within normal limits  CBC - Abnormal; Notable for the following components:   RBC 3.21 (*)    Hemoglobin 10.0 (*)    HCT 31.9 (*)    RDW 17.1 (*)    Platelets 93 (*)    All other components within normal limits  GLUCOSE, CAPILLARY - Abnormal; Notable for the following components:   Glucose-Capillary 137 (*)    All other components within normal limits  GLUCOSE, CAPILLARY - Abnormal; Notable for the following components:   Glucose-Capillary 126 (*)    All other components within normal limits  RESP PANEL BY RT-PCR (RSV, FLU A&B, COVID)  RVPGX2  CULTURE, BLOOD (ROUTINE X 2)  CULTURE, BLOOD (ROUTINE X 2)  LACTIC ACID, PLASMA  PROTIME-INR  APTT  PROCALCITONIN  BRAIN NATRIURETIC PEPTIDE  MAGNESIUM    Notable for LA2.0, wbc 4.9  EKG   EKG Interpretation Date/Time:    Ventricular Rate:    PR Interval:    QRS Duration:    QT Interval:    QTC Calculation:   R Axis:      Text Interpretation:           Imaging Studies ordered: I ordered imaging studies including cxr I independently visualized the following imaging with scope of interpretation limited to determining acute life threatening conditions related to emergency care; findings noted above, significant for stable I independently visualized and interpreted imaging. I agree with the radiologist interpretation   Medicines ordered and prescription drug management: Meds ordered this encounter  Medications   DISCONTD: lactated ringers infusion   lactated ringers bolus 1,000 mL    Order Specific Question:   Reason 30 mL/kg dose is not being ordered    Answer:   First Lactic Acid Pending   acetaminophen (TYLENOL) tablet 650 mg   DISCONTD: vancomycin (VANCOCIN) IVPB 1000 mg/200 mL premix    Order Specific Question:   Antibiotic Indication:    Answer:   Cellulitis   vancomycin  (VANCOREADY) IVPB 2000 mg/400 mL    Order Specific Question:   Indication:    Answer:   Cellulitis   DISCONTD: vancomycin (VANCOREADY) IVPB 1750 mg/350 mL    Order Specific Question:   Indication:    Answer:   Cellulitis   acyclovir (ZOVIRAX) tablet 400 mg   DISCONTD: furosemide (LASIX) tablet 20 mg    bid     pravastatin (PRAVACHOL) tablet 20 mg   venlafaxine XR (EFFEXOR-XR) 24 hr capsule 150 mg   pantoprazole (PROTONIX) EC tablet 40 mg   OR Linked Order Group    acetaminophen (TYLENOL) tablet 650 mg    acetaminophen (TYLENOL) suppository 650 mg   OR Linked Order Group    ondansetron (ZOFRAN) tablet 4 mg    ondansetron (ZOFRAN) injection 4 mg   0.9 % NaCl with KCl 20 mEq/ L  infusion   insulin aspart (novoLOG) injection 0-9 Units    Order Specific Question:   Correction coverage:    Answer:   Sensitive (thin, NPO, renal)    Order Specific Question:   CBG < 70:    Answer:   Implement Hypoglycemia Standing Orders and refer to Hypoglycemia Standing Orders sidebar report    Order Specific Question:   CBG 70 - 120:    Answer:   0 units    Order Specific Question:   CBG 121 - 150:    Answer:   1 unit    Order Specific Question:   CBG 151 - 200:    Answer:   2 units    Order Specific Question:   CBG 201 - 250:    Answer:   3 units    Order Specific Question:   CBG 251 - 300:    Answer:   5 units    Order Specific Question:   CBG 301 - 350:    Answer:   7 units    Order Specific Question:   CBG 351 - 400    Answer:   9 units    Order Specific Question:   CBG > 400    Answer:   call MD and obtain STAT lab verification   furosemide (LASIX) injection 40 mg   furosemide (LASIX) tablet 20 mg   AND Linked Order Group  oxyCODONE-acetaminophen (PERCOCET/ROXICET) 5-325 MG per tablet 1 tablet    oxyCODONE (Oxy IR/ROXICODONE) immediate release tablet 5 mg   iohexol (OMNIPAQUE) 300 MG/ML solution 75 mL   ketorolac (TORADOL) 15 MG/ML injection 15 mg   ceFAZolin (ANCEF) IVPB 2g/100 mL  premix    Order Specific Question:   Antibiotic Indication:    Answer:   Bacteremia    -I have reviewed the patients home medicines and have made adjustments as needed   Consultations Obtained: na   Cardiac Monitoring: The patient was maintained on a cardiac monitor.  I personally viewed and interpreted the cardiac monitored which showed an underlying rhythm of: SR  Social Determinants of Health:  Diagnosis or treatment significantly limited by social determinants of health: obesity   Reevaluation: After the interventions noted above, I reevaluated the patient and found that they have improved  Co morbidities that complicate the patient evaluation  Past Medical History:  Diagnosis Date   Adrenal adenoma, left    Stable   Anxiety    Arthritis    bilateral hands   Depression    Diabetes mellitus, type 2 (HCC) 08/12/2008   Qualifier: Diagnosis of  By: Dayton Martes MD, Talia     Dyspnea    Esophageal varices (HCC)    Grade II diastolic dysfunction    History of kidney stones    Hyperlipidemia    Hypertension    Lower back pain    Lower GI bleed 03/19/2020   Panic attacks    Pneumonia    currently taking antibiotic and prednisone for early stages of pneumonia   Pulmonary nodules    bilateral   Skin cancer    face      Dispostion: Disposition decision including need for hospitalization was considered, and patient admitted to the hospital.    Final Clinical Impression(s) / ED Diagnoses Final diagnoses:  Sepsis, due to unspecified organism, unspecified whether acute organ dysfunction present [facility])  Cellulitis of other specified site     This chart was dictated using voice recognition software.  Despite best efforts to proofread,  errors can occur which can change the documentation meaning.    Sloan Leiter, DO 09/27/22 [phone]

## 2022-09-26 NOTE — Progress Notes (Signed)
Pharmacy Antibiotic Note  Savannah Burns is a 72 y.o. female admitted on 09/26/2022 with cellulitis.  Pharmacy has been consulted for vancomycin dosing.  Plan: Vancomycin 2000 mg IV x 1 dose. Vancomycin 1750 mg IV every 24 hours. Monitor labs, c/s, and vanco levels as indicated.  Height: 5\' 2"  (157.5 cm) Weight: 127 kg (280 lb) IBW/kg (Calculated) : 50.1  Temp (24hrs), Avg:102.6 F (39.2 C), Min:102.6 F (39.2 C), Max:102.6 F (39.2 C)  Recent Labs  Lab 09/26/22 1159  WBC 4.9  CREATININE 0.68  LATICACIDVEN 2.0*    Estimated Creatinine Clearance: 80 mL/min (by C-G formula based on SCr of 0.68 mg/dL).    Allergies  Allergen Reactions   Keflex [Cephalexin] Nausea And Vomiting    Antimicrobials this admission: Vanco 7/1 >>   Microbiology results: 7/1 BCx: pending   Thank you for allowing pharmacy to be a part of this patient's care.  Judeth Cornfield, PharmD Clinical Pharmacist 09/26/2022 12:50 PM

## 2022-09-26 NOTE — H&P (Signed)
History and Physical    Patient: Savannah Burns BJY:[id number] DOB: [birth year] DOA: 09/26/2022 DOS: the patient was seen and examined on 09/26/2022 PCP: Raliegh Ip, DO  Patient coming from: Home  Chief Complaint:  Chief Complaint  Patient presents with   Abscess   HPI: Savannah Burns is a 72 year old female with a history of NASH Cirrhosis, diabetes mellitus type 2, iron deficiency, hypertension, hyperlipidemia, COPD, myeloma, depression, chronic respiratory failure on 3 L presenting with cellulitis and abscess on her right flank and axilla.  Patient states that she developed a boil on her right flank and axilla area about 2 weeks prior to this admission.  The patient was prescribed Bactrim DS 1 p.o. twice daily for 7 days.  She stated that the area did not improve very much.  She was subsequently prescribed cephalexin.  She developed nausea and vomiting with cephalexin.  She was changed over to doxycycline.  Unfortunately, she continued to have some redness and pain in the right axillary area.  On the evening of 09/25/2022, the patient developed a fever at home.  On the morning of 09/26/2022, she continued to have fever and generalized weakness to the point where she had difficulty getting out of bed.  As result, she presented for further evaluation and treatment.  She had an episode of nausea and vomiting prior to admission.  She denies any headache, chest pain, shortness breath, coughing, hemoptysis, diarrhea, abdominal pain, hematochezia, melena.  In the ED, the patient was febrile up to 100.6 F.  She was tachycardic.  He was hemodynamically stable with oxygen saturation 100% on 3 L.  WBC 4.9, hemoglobin 10.2, platelets 89,000.  Sodium 133, potassium 4.1, bicarbonate 26, serum creatinine 0.68.  Chest x-ray showed left basilar scarring and chronic interstitial markings.  Lactic acid was 2.0.  EKG shows sinus rhythm right bundle branch block.  The patient was started on IV vancomycin and  admitted for further evaluation and treatment. Review of Systems: As mentioned in the history of present illness. All other systems reviewed and are negative. Past Medical History:  Diagnosis Date   Adrenal adenoma, left    Stable   Anxiety    Arthritis    bilateral hands   Depression    Diabetes mellitus, type 2 (HCC) 08/12/2008   Qualifier: Diagnosis of  By: Dayton Martes MD, Talia     Dyspnea    Esophageal varices (HCC)    Grade II diastolic dysfunction    History of kidney stones    Hyperlipidemia    Hypertension    Lower back pain    Lower GI bleed 03/19/2020   Panic attacks    Pneumonia    currently taking antibiotic and prednisone for early stages of pneumonia   Pulmonary nodules    bilateral   Skin cancer    face   Past Surgical History:  Procedure Laterality Date   BIOPSY  04/07/2020   Procedure: BIOPSY;  Surgeon: Lanelle Bal, DO;  [facility];  Service: Endoscopy;;   Breast Cystectomy  Right    CESAREAN SECTION     COLONOSCOPY WITH PROPOFOL N/A 01/25/2020   Dr. Marletta Lor: Nonbleeding internal hemorrhoids, diverticulosis, 5 mm polyp removed from the ascending colon, 10 mm polyp removed from the sigmoid colon, 30 mm polyp (tubulovillous adenoma with no high-grade dysplasia) removed from the transverse colon via piecemeal status post tattoo.  Other polyps were tubular adenomas.  3 month surveillance colonoscopy recommended.   COLONOSCOPY WITH PROPOFOL N/A 04/07/2020  Procedure: COLONOSCOPY WITH PROPOFOL;  Surgeon: Lanelle Bal, DO;  [facility]: [facility];  Service: Endoscopy;  Laterality: N/A;  3:00pm, pt knows Savannah time per office   CYSTOSCOPY/URETEROSCOPY/HOLMIUM LASER/STENT PLACEMENT Bilateral 03/01/2019   Procedure: CYSTOSCOPY/RETROGRADEURETEROSCOPY/HOLMIUM LASER/STENT PLACEMENT;  Surgeon: Rene Paci, MD;  Location: [facility];  Service: Urology;  Laterality: Bilateral;  ONLY NEEDS 60 MIN   ESOPHAGOGASTRODUODENOSCOPY (EGD) WITH PROPOFOL N/A  01/25/2020   Dr. Marletta Lor: 4 columns grade 1 esophageal varices   ESOPHAGOGASTRODUODENOSCOPY (EGD) WITH PROPOFOL N/A 05/18/2020   Procedure: ESOPHAGOGASTRODUODENOSCOPY (EGD) WITH PROPOFOL;  Surgeon: Lanelle Bal, DO;  [facility];  Service: Endoscopy;  Laterality: N/A;   ESOPHAGOGASTRODUODENOSCOPY (EGD) WITH PROPOFOL N/A 07/28/2020   Procedure: ESOPHAGOGASTRODUODENOSCOPY (EGD) WITH PROPOFOL;  Surgeon: Lanelle Bal, DO;  [facility];  Service: Endoscopy;  Laterality: N/A;  am or early PM due to givens capsule placement   GIVENS CAPSULE STUDY N/A 05/18/2020   Procedure: GIVENS CAPSULE STUDY;  Surgeon: Dolores Frame, MD;  [facility];  Service: Gastroenterology;  Laterality: N/A;   GIVENS CAPSULE STUDY N/A 07/28/2020   Procedure: GIVENS CAPSULE STUDY;  Surgeon: Lanelle Bal, DO;  [facility];  Service: Endoscopy;  Laterality: N/A;   IR IMAGING GUIDED PORT INSERTION  12/02/2021   POLYPECTOMY  01/25/2020   Procedure: POLYPECTOMY;  Surgeon: Lanelle Bal, DO;  [facility];  Service: Endoscopy;;   POLYPECTOMY  04/07/2020   Procedure: POLYPECTOMY INTESTINAL;  Surgeon: Lanelle Bal, DO;  [facility];  Service: Endoscopy;;   SKIN CANCER EXCISION     Face   SPINE SURGERY     SUBMUCOSAL TATTOO INJECTION  01/25/2020   Procedure: SUBMUCOSAL TATTOO INJECTION;  Surgeon: Lanelle Bal, DO;  [facility];  Service: Endoscopy;;   Social History:  reports that she quit smoking about 7 years ago. Her smoking use included cigarettes. She has a 60.00 pack-year smoking history. She has never used smokeless tobacco. She reports that she does not drink alcohol and does not use drugs.  Allergies  Allergen Reactions   Keflex [Cephalexin] Nausea And Vomiting    Family History  Problem Relation Age of Onset   Diabetes Father    Heart disease Father 59       MI   Hypertension Father    Anemia Mother         Transfusion dependent   COPD Sister    Cancer Paternal Grandmother 32       Pancreatic    Prior to Admission medications   Medication Sig Start Date End Date Taking? Authorizing Provider  doxycycline (VIBRA-TABS) 100 MG tablet Take 1 tablet (100 mg total) by mouth 2 (two) times daily for 7 days. 09/20/22 09/27/22  Doreatha Massed, MD  acyclovir (ZOVIRAX) 400 MG tablet Take 1 tablet (400 mg total) by mouth 2 (two) times daily. 11/30/21   Doreatha Massed, MD  Alpha-Lipoic Acid 600 MG CAPS Take 1 capsule (600 mg total) by mouth daily. For neuropathy 06/08/22   Delynn Flavin M, DO  bacitracin ointment Apply 1 Application topically 2 (two) times daily. 05/23/22   Jeanelle Malling, PA  baclofen (LIORESAL) 10 MG tablet Take 10 mg by mouth 2 (two) times daily. 03/03/22   [provider]  Blood Glucose Calibration (TRUE METRIX LEVEL 1) Low SOLN Use w/ glucose monitor Dx E11.9 09/01/21   Delynn Flavin M, DO  Blood Glucose Monitoring Suppl (TRUE METRIX AIR  GLUCOSE METER) w/Device KIT Check BS daily Dx E11.9 09/01/21   Delynn Flavin M, DO  carvedilol (COREG) 6.25 MG tablet Take 6.25 mg by mouth 2 (two) times daily. 11/10/21   [provider]  Cholecalciferol (VITAMIN D) 50 MCG (2000 UT) tablet Take 2,000 Units by mouth daily.    [provider]  clobetasol (TEMOVATE) 0.05 % external solution Apply topically. 01/24/22   [provider]  CONSTULOSE 10 GM/15ML solution Take by mouth. 11/10/21   [provider]  Cyanocobalamin (B-12 COMPLIANCE INJECTION) 1000 MCG/ML KIT Inject 1,000 mcg as directed every 30 (thirty) days.    [provider]  daratumumab-hyaluronidase-fihj (DARZALEX FASPRO) 1800-30000 MG-UT/15ML SOLN Inject 1,800 mg into the skin once a week. 12/14/21   [provider]  desvenlafaxine (PRISTIQ) 100 MG 24 hr tablet Take 1 tablet (100 mg total) by mouth daily. 02/25/22   Raliegh Ip, DO  dexamethasone (DECADRON) 4 MG tablet  Take 5 tablets (20 mg total) by mouth once a week. 05/24/22   Doreatha Massed, MD  esomeprazole (NEXIUM) 20 MG capsule Take 1 capsule (20 mg total) by mouth daily at 12 noon. 12/09/20   Raliegh Ip, DO  ferrous sulfate 325 (65 FE) MG tablet Take 1 tablet (325 mg total) by mouth daily with breakfast. 02/22/22 06/22/22  Doreatha Massed, MD  fluticasone (CUTIVATE) 0.05 % cream Apply 1 Application topically daily. 01/24/22   [provider]  furosemide (LASIX) 20 MG tablet Take 20 mg by mouth. bid    [provider]  glucose blood (TRUE METRIX BLOOD GLUCOSE TEST) test strip Check BS daily Dx E11.9 09/01/21   Delynn Flavin M, DO  lenalidomide (REVLIMID) 15 MG capsule TAKE 1 CAPSULE BY MOUTH EVERY DAY FOR 21 DAYS ON THEN 7 DAYS OFF 08/24/22   Doreatha Massed, MD  lidocaine-prilocaine (EMLA) cream Apply 1 Application topically as needed. Apply a small amount to port a cath site and cover with plastic wrap 1 hour prior to infusion appointments 06/21/22   Doreatha Massed, MD  lovastatin (MEVACOR) 20 MG tablet Take 1 tablet (20 mg total) by mouth at bedtime. 10/30/19   Raliegh Ip, DO  omeprazole (PRILOSEC) 20 MG capsule Take 20 mg by mouth 2 (two) times daily. 08/16/21   [provider]  oxyCODONE-acetaminophen (PERCOCET) 10-325 MG tablet Take 1 tablet by mouth every 6 (six) hours as needed for pain.    [provider]  OXYGEN Inhale 3 L into the lungs continuous.    [provider]  OZEMPIC, 0.25 OR 0.5 MG/DOSE, 2 MG/3ML SOPN Inject into the skin. 12/11/21   [provider]  potassium chloride SA (KLOR-CON) 20 MEQ tablet Take 2 tablets (40 mEq total) by mouth 2 (two) times daily. Patient taking differently: Take 40 mEq by mouth daily. 04/24/20   Raliegh Ip, DO  prochlorperazine (COMPAZINE) 10 MG tablet Take 1 tablet (10 mg total) by mouth every 6 (six) hours as needed for nausea or vomiting. 12/14/21   Doreatha Massed, MD  torsemide (DEMADEX) 20 MG tablet Take 3 tablets (60 mg total) by mouth 2 (two) times daily. Patient taking differently: Take 50 mg by mouth 2 (two) times daily. 02/18/20 05/06/22  Dyann Kief, PA-C  TRUEplus Lancets 33G MISC Check BS daily Dx E11.9 09/01/21   Delynn Flavin M, DO  albuterol (PROVENTIL HFA;VENTOLIN HFA) 108 (90 Base) MCG/ACT inhaler Inhale 2 puffs into the lungs every 6 (six) hours as needed for wheezing or shortness  of breath. 09/21/16 09/22/16  Pincus Large, DO    Physical Exam: Vitals:   09/26/22 1215 09/26/22 1230 09/26/22 1330 09/26/22 1400  BP: (!) 115/51 (!) 120/47 (!) 131/55 (!) 127/56  Pulse: 99 98 90 91  Resp: 19 (!) 25 (!) 27 19  Temp:    99.8 F (37.7 C)  TempSrc:    Oral  SpO2: 100% 99% 97% 98%  Weight:      Height:       GENERAL:  A&O x 3, NAD, well developed, cooperative, follows commands HEENT: [place]/AT, No thrush, No icterus, No oral ulcers Neck:  No neck mass, No meningismus, soft, supple CV: RRR, no S3, no S4, no rub, no JVD Lungs: Bibasilar crackles.  No wheezing.  Good air movement Abd: soft/NT +BS, nondistended Ext: 2  +LE edema, no lymphangitis, no cyanosis, no rashes Neuro:  CN II-XII intact, strength 4/5 in RUE, RLE, strength 4/5 LUE, LLE; sensation intact bilateral; no dysmetria; babinski equivocal  Data Reviewed: Data reviewed above in the history  Assessment and Plan: Sepsis -Present on admission -Secondary to cellulitis/abscess -Lactic acid 2.0 -continue vancomycin  Right axillary/flank abscess -CT chest -continue vancomycin  Thrombocytopenia -Secondary to myeloma and immunotherapy and liver cirrhosis -Acute worsening secondary to infection  Myeloma, not in remission -Last Darzalex was first week in June -Revlimid on hold due to active infection -diagnosed 11/19/21 -Continue acyclovir  Essential hypertension -Holding carvedilol temporarily in the setting of sepsis  Chronic respiratory failure with  hypoxia -Chronically on 3 L -Stable  NASH Liver Cirrhosis -Patient has been evaluated by transplant hepatology at [facility]  Morbid obesity -BMI 51.21 -Lifestyle modification  Mixed hyperlipidemia -Continue statin  Depression -Continued Pristiq    Advance Care Planning: FULL  Consults: none  Family Communication: none present  Severity of Illness: The appropriate patient status for this patient is INPATIENT. Inpatient status is judged to be reasonable and necessary in order to provide the required intensity of service to ensure the patient's safety. The patient's presenting symptoms, physical exam findings, and initial radiographic and laboratory data in the context of their chronic comorbidities is felt to place them at high risk for further clinical deterioration. Furthermore, it is not anticipated that the patient will be medically stable for discharge from the hospital within 2 midnights of admission.   * I certify that at the point of admission it is my clinical judgment that the patient will require inpatient hospital care spanning beyond 2 midnights from the point of admission due to high intensity of service, high risk for further deterioration and high frequency of surveillance required.*  Author: Catarina Hartshorn, MD 09/26/2022 3:35 PM  For on call review [email or web address].

## 2022-09-26 NOTE — ED Triage Notes (Signed)
Pt has had a draining abscess to her right side for over 1 week.  Pt reports she completed doxycycline and was no better.  PCP called in an abx that she was allergic to after that and now she has a fever.

## 2022-09-26 NOTE — Progress Notes (Signed)
   09/26/22 2157  Vitals  Temp (!) 103.1 F (39.5 C) (Nurse notified)  Temp Source Oral  BP (!) 134/96  MAP (mmHg) 110  BP Location Right Wrist  BP Method Automatic  Patient Position (if appropriate) Lying  Pulse Rate 96  Resp 19  MEWS COLOR  MEWS Score Color Yellow  Oxygen Therapy  SpO2 96 %  O2 Device Nasal Cannula  O2 Flow Rate (L/min) 3 L/min  MEWS Score  MEWS Temp 2  MEWS Systolic 0  MEWS Pulse 0  MEWS RR 0  MEWS LOC 0  MEWS Score 2   Patient temp is 103.62F taken twice

## 2022-09-26 NOTE — Sepsis Progress Note (Signed)
Sepsis protocol is being followed by [facility]. 

## 2022-09-27 ENCOUNTER — Inpatient Hospital Stay (HOSPITAL_COMMUNITY): Payer: Medicare PPO

## 2022-09-27 ENCOUNTER — Inpatient Hospital Stay: Payer: Medicare PPO

## 2022-09-27 ENCOUNTER — Inpatient Hospital Stay: Payer: Medicare PPO | Admitting: Hematology

## 2022-09-27 DIAGNOSIS — B9561 Methicillin susceptible Staphylococcus aureus infection as the cause of diseases classified elsewhere: Secondary | ICD-10-CM | POA: Diagnosis not present

## 2022-09-27 DIAGNOSIS — R7881 Bacteremia: Secondary | ICD-10-CM

## 2022-09-27 DIAGNOSIS — Z794 Long term (current) use of insulin: Secondary | ICD-10-CM

## 2022-09-27 DIAGNOSIS — C9000 Multiple myeloma not having achieved remission: Secondary | ICD-10-CM | POA: Diagnosis not present

## 2022-09-27 DIAGNOSIS — J9611 Chronic respiratory failure with hypoxia: Secondary | ICD-10-CM

## 2022-09-27 DIAGNOSIS — A4101 Sepsis due to Methicillin susceptible Staphylococcus aureus: Secondary | ICD-10-CM | POA: Insufficient documentation

## 2022-09-27 DIAGNOSIS — E119 Type 2 diabetes mellitus without complications: Secondary | ICD-10-CM

## 2022-09-27 DIAGNOSIS — Z9981 Dependence on supplemental oxygen: Secondary | ICD-10-CM

## 2022-09-27 LAB — CBC
HCT: 31.9 % — ABNORMAL LOW (ref 36.0–46.0)
Hemoglobin: 10 g/dL — ABNORMAL LOW (ref 12.0–15.0)
MCH: 31.2 pg (ref 26.0–34.0)
MCHC: 31.3 g/dL (ref 30.0–36.0)
MCV: 99.4 fL (ref 80.0–100.0)
Platelets: 93 10*3/uL — ABNORMAL LOW (ref 150–400)
RBC: 3.21 MIL/uL — ABNORMAL LOW (ref 3.87–5.11)
RDW: 17.1 % — ABNORMAL HIGH (ref 11.5–15.5)
WBC: 4.4 10*3/uL (ref 4.0–10.5)
nRBC: 0 % (ref 0.0–0.2)

## 2022-09-27 LAB — ECHOCARDIOGRAM COMPLETE
AR max vel: 1.77 cm2
AV Area VTI: 1.87 cm2
AV Area mean vel: 1.9 cm2
AV Mean grad: 18 mmHg
AV Peak grad: 35.3 mmHg
Ao pk vel: 2.97 m/s
Area-P 1/2: 2.21 cm2
Height: 62 in
S' Lateral: 3.2 cm
Weight: 4480 oz

## 2022-09-27 LAB — BLOOD CULTURE ID PANEL (REFLEXED) - BCID2

## 2022-09-27 LAB — BASIC METABOLIC PANEL
Anion gap: 9 (ref 5–15)
BUN: 15 mg/dL (ref 8–23)
CO2: 28 mmol/L (ref 22–32)
Calcium: 8.1 mg/dL — ABNORMAL LOW (ref 8.9–10.3)
Chloride: 99 mmol/L (ref 98–111)
Creatinine, Ser: 0.9 mg/dL (ref 0.44–1.00)
GFR, Estimated: >60 mL/min (ref >60)
Glucose, Bld: 117 mg/dL — ABNORMAL HIGH (ref 70–99)
Potassium: 3.7 mmol/L (ref 3.5–5.1)
Sodium: 136 mmol/L (ref 135–145)

## 2022-09-27 LAB — GLUCOSE, CAPILLARY
Glucose-Capillary: 144 mg/dL — ABNORMAL HIGH (ref 70–99)
Glucose-Capillary: 143 mg/dL — ABNORMAL HIGH (ref 70–99)
Glucose-Capillary: 116 mg/dL — ABNORMAL HIGH (ref 70–99)
Glucose-Capillary: 202 mg/dL — ABNORMAL HIGH (ref 70–99)

## 2022-09-27 LAB — MAGNESIUM: Magnesium: 1.9 mg/dL (ref 1.7–2.4)

## 2022-09-27 MED ORDER — CHLORHEXIDINE GLUCONATE CLOTH 2 % EX PADS
6.0000 | MEDICATED_PAD | Freq: Every day | CUTANEOUS | Status: DC
  Administered 2022-09-27 – 2022-10-03 (×7): 6 via TOPICAL

## 2022-09-27 MED ORDER — CEFAZOLIN SODIUM-DEXTROSE 2-4 GM/100ML-% IV SOLN
2.0000 g | Freq: Three times a day (TID) | INTRAVENOUS | Status: DC
  Administered 2022-09-27 – 2022-09-28 (×4): 2 g via INTRAVENOUS
  Filled 2022-09-27 (×6): qty 100

## 2022-09-27 NOTE — Progress Notes (Addendum)
Pt had a temp in shift of 102.2,Yellow Mews protocol initiated. Pt had ice packs and tylenol was given. Pt has good urine output. Pt remains A&O and hot to touch with diaphoresis. Pt changed with assistance of nurses and NT. Vitals checked and Temp came back down to  99.6. Pt states she feels better since having the tylenol. MD notified. Pt is to be NPO effective midnight. Pt got up and walked to nurses station with PT today.

## 2022-09-27 NOTE — Progress Notes (Signed)
   09/27/22 1632  Assess: MEWS Score  Temp (!) 102.2 F (39 C)  BP (!) 126/95  MAP (mmHg) 91  Pulse Rate 92  Resp 20  SpO2 100 %  O2 Device Nasal Cannula  O2 Flow Rate (L/min) 3 L/min  Assess: MEWS Score  MEWS Temp 2  MEWS Systolic 0  MEWS Pulse 0  MEWS RR 0  MEWS LOC 0  MEWS Score 2  MEWS Score Color Yellow  Assess: if the MEWS score is Yellow or Red  Were vital signs taken at a resting state? Yes  Focused Assessment No change from prior assessment  Does the patient meet 2 or more of the SIRS criteria? Yes  Does the patient have a confirmed or suspected source of infection? Yes  MEWS guidelines implemented  Yes, yellow  Treat  MEWS Interventions Considered administering scheduled or prn medications/treatments as ordered  Take Vital Signs  Increase Vital Sign Frequency  Yellow: Q2hr x1, continue Q4hrs until patient remains green for 12hrs  Escalate  MEWS: Escalate Yellow: Discuss with charge nurse and consider notifying provider and/or RRT  Notify: Charge Nurse/RN  Name of Charge Nurse/RN Notified Jiles Crocker, RN  Provider Notification  Provider Name/Title Dr. Arbutus Leas  Date Provider Notified 09/27/22  Time Provider Notified 1705  Method of Notification Page  Notification Reason Change in status  Provider response No new orders  Date of Provider Response 09/27/22  Time of Provider Response 1705  Assess: SIRS CRITERIA  SIRS Temperature  1  SIRS Pulse 1  SIRS Respirations  0  SIRS WBC 0  SIRS Score Sum  2   Yellow MEWS for temp of 102.2 Tylenol given and pt is packed in ice. Notified Dr. Arbutus Leas and Addison Naegeli, RN . Yellows MEWS protocol initiated.

## 2022-09-27 NOTE — TOC Initial Note (Addendum)
Transition of Care [facility]) - Initial/Assessment Note    Patient Details  Name: Savannah Burns MRN: [id number] Date of Birth: 1950-09-17  Transition of Care [facility]) CM/SW Contact:    Annice Needy, LCSW Phone Number: 09/27/2022, 12:04 PM  Clinical Narrative:                 Patient from home with granddaughter. Independent with ADLs, drives, ambulates with a walker. Has home oxygen 2L provided by [employer], walker, potty chair, and bath chair. Considered high risk for readmission. Denies food insecurity states that granddaughter assists with food. Declines food resources.    Expected Discharge Plan: Home/Self Care Barriers to Discharge: Continued Medical Work up   Patient Goals and CMS Choice Patient states their goals for this hospitalization and ongoing recovery are:: reutrn home          Expected Discharge Plan and Services                                              Prior Living Arrangements/Services     Patient language and need for interpreter reviewed:: Yes Do you feel safe going back to the place where you live?: Yes      Need for Family Participation in Patient Care: Yes (Comment) Care giver support system in place?: Yes (comment) Current home services: DME (home oxygen 2L provided by [employer], walker, potty chair, and bath chair) Criminal Activity/Legal Involvement Pertinent to Current Situation/Hospitalization: No - Comment as needed  Activities of Daily Living Home Assistive Devices/Equipment: Bedside commode/3-in-1, CBG Meter, Dentures (specify type), Eyeglasses, Oxygen, Shower chair without back, Walker (specify type), Other (Comment) (leg pumps) ADL Screening (condition at time of admission) Patient's cognitive ability adequate to safely complete daily activities?: Yes Is the patient deaf or have difficulty hearing?: No Does the patient have difficulty seeing, even when wearing glasses/contacts?: No Does the patient have difficulty  concentrating, remembering, or making decisions?: No Patient able to express need for assistance with ADLs?: Yes Does the patient have difficulty dressing or bathing?: Yes Independently performs ADLs?: No Communication: Independent Dressing (OT): Needs assistance Is this a change from baseline?: Change from baseline, expected to last <3days Grooming: Independent Feeding: Independent Bathing: Needs assistance Is this a change from baseline?: Change from baseline, expected to last <3 days Toileting: Needs assistance Is this a change from baseline?: Change from baseline, expected to last <3 days In/Out Bed: Needs assistance Is this a change from baseline?: Change from baseline, expected to last <3 days Walks in Home: Independent with device (comment) Does the patient have difficulty walking or climbing stairs?: Yes Weakness of Legs: Both Weakness of Arms/Hands: Both  Permission Sought/Granted                  Emotional Assessment     Affect (typically observed): Appropriate Orientation: : Oriented to Self, Oriented to Place, Oriented to  Time, Oriented to Situation Alcohol / Substance Use: Not Applicable Psych Involvement: No (comment)  Admission diagnosis:  Sepsis due to undetermined organism (HCC) [A41.9] Cellulitis of other specified site [L03.818] Sepsis, due to unspecified organism, unspecified whether acute organ dysfunction present [facility]) [A41.9] Patient Active Problem List   Diagnosis Date Noted   Sepsis due to undetermined organism (HCC) 09/26/2022   Obesity, Class III, BMI 40-49.9 (morbid obesity) (HCC) 09/26/2022   Chronic respiratory failure with hypoxia (HCC) 09/26/2022  Thrombocytopenia (HCC) 09/26/2022   Cellulitis of axilla, right 09/26/2022   Sepsis (HCC) 09/26/2022   Diabetic peripheral neuropathy associated with type 2 diabetes mellitus (HCC) 02/25/2022   Multiple myeloma (HCC) 11/30/2021   H. pylori infection 08/12/2021   Cirrhosis of liver (HCC)  08/04/2021   Hypoalbuminemia 05/17/2020   Moderate protein malnutrition (HCC) 05/17/2020   Melena 05/16/2020   Hyperlipidemia    Hypokalemia    GI bleed 03/20/2020   Lower GI bleed 03/19/2020   Acute on chronic congestive heart failure (HCC)    Symptomatic anemia    Iron deficiency anemia    Heme positive stool    ABLA (acute blood loss anemia) 01/22/2020   Supplemental oxygen dependent 10/22/2019   Chronic dyspnea 10/22/2019   Hypertension associated with diabetes (HCC) 10/22/2019   Bilateral hand pain 09/21/2016   Fatigue 07/30/2015   Chronic diastolic HF (heart failure) (HCC) 06/29/2015   Postmenopausal bleeding 04/17/2015   Excessive daytime sleepiness 12/19/2014   Swelling of lower extremity 11/27/2014   Back pain 06/13/2013   Sinusitis, chronic 05/30/2012   Allergic rhinitis 06/06/2011   Hyperlipidemia associated with type 2 diabetes mellitus (HCC) 03/24/2009   Diabetes mellitus, type 2 (HCC) 08/12/2008   Pulmonary nodule 08/29/2007   COPD (chronic obstructive pulmonary disease) (HCC) 02/14/2007   Class 3 obesity (HCC) 05/25/2006   Depression, recurrent (HCC) 05/25/2006   HYPERTENSION, BENIGN SYSTEMIC 05/25/2006   DJD, UNSPECIFIED 05/25/2006   PCP:  Raliegh Ip, DO Pharmacy:   [facility] [address] [place] - [address] [place] [address] Phone: [phone] Fax: [phone]  [facility] - [place], [place] - [address] [facility] [address] [place] [address] [place] [address] Phone: [phone] Fax: [phone]  [facility] - [place], [place] - [address] [place] [address] Phone: [phone] Fax: [phone]     Social Determinants of Health (SDOH) Social History: SDOH Screenings   Food Insecurity: Food Insecurity Present (09/26/2022)  Housing: Low Risk  (09/26/2022)  Transportation Needs: No Transportation Needs (09/26/2022)  Utilities: Not At  Risk (09/26/2022)  Alcohol Screen: Low Risk  (07/27/2021)  Depression (PHQ2-9): Medium Risk (06/02/2022)  Financial Resource Strain: Medium Risk (07/27/2021)  Physical Activity: Inactive (07/27/2021)  Social Connections: Socially Isolated (07/27/2021)  Stress: Stress Concern Present (07/27/2021)  Tobacco Use: Medium Risk (09/26/2022)   SDOH Interventions:     Readmission Risk Interventions     No data to display

## 2022-09-27 NOTE — Progress Notes (Signed)
  Echocardiogram 2D Echocardiogram has been performed.  Maren Reamer 09/27/2022, 11:44 AM

## 2022-09-27 NOTE — Progress Notes (Signed)
PROGRESS NOTE  Savannah Burns ZOX:[id number] DOB: 03-13-1951 DOA: 09/26/2022 PCP: Raliegh Ip, DO  Brief History:  72 year old female with a history of NASH Cirrhosis, diabetes mellitus type 2, iron deficiency, hypertension, hyperlipidemia, COPD, myeloma, depression, chronic respiratory failure on 3 L presenting with cellulitis and abscess on her right flank and axilla.  Patient states that she developed a boil on her right flank and axilla area about 2 weeks prior to this admission.  The patient was prescribed Bactrim DS 1 p.o. twice daily for 7 days.  She stated that the area did not improve very much.  She was subsequently prescribed cephalexin.  She developed nausea and vomiting with cephalexin.  She was changed over to doxycycline.  Unfortunately, she continued to have some redness and pain in the right axillary area.  On the evening of 09/25/2022, the patient developed a fever at home.  On the morning of 09/26/2022, she continued to have fever and generalized weakness to the point where she had difficulty getting out of bed.  As result, she presented for further evaluation and treatment.  She had an episode of nausea and vomiting prior to admission.  She denies any headache, chest pain, shortness breath, coughing, hemoptysis, diarrhea, abdominal pain, hematochezia, melena.   In the ED, the patient was febrile up to 100.6 F.  She was tachycardic.  He was hemodynamically stable with oxygen saturation 100% on 3 L.  WBC 4.9, hemoglobin 10.2, platelets 89,000.  Sodium 133, potassium 4.1, bicarbonate 26, serum creatinine 0.68.  Chest x-ray showed left basilar scarring and chronic interstitial markings.  Lactic acid was 2.0.  EKG shows sinus rhythm right bundle branch block.  The patient was started on IV vancomycin and admitted for further evaluation and treatment.  Assessment/Plan: Sepsis -Present on admission -Secondary to cellulitis/abscess and MSSA bacteremia -Lactic acid  2.0 -initially started vancomycin -now on cefazolin -sepsis physiology resolved  MSSA Bacteremia -09/26/22 blood culture = MSSA -d/c vanc -started cefazolin -repeat blood culture 7/2 -7/2 Echo--EF >75%, no WMA, G1DD, mod AS -will likely need port-a-cath removed -ID following   Right axillary/flank abscess -CT chest--no drainable abscess -discontinue vancomycin -continue cefazolin   Thrombocytopenia -Secondary to myeloma and immunotherapy and liver cirrhosis -Acute worsening secondary to infection   Myeloma, not in remission -Last Darzalex was first week in June -Revlimid on hold due to active infection -diagnosed 11/19/21 -Continue acyclovir   Essential hypertension -Holding carvedilol temporarily in the setting of sepsis -hold lasix   Chronic respiratory failure with hypoxia -Chronically on 3 L -Stable   NASH Liver Cirrhosis -Patient has been evaluated by transplant hepatology at [facility]   Morbid obesity -BMI 51.21 -Lifestyle modification   Mixed hyperlipidemia -Continue statin   Depression -Continued Pristiq      Family Communication:   daughter at bedside 7/2  Consultants:  ID  Code Status:  FULL   DVT Prophylaxis:  SCDs   Procedures: As Listed in Progress Note Above  Antibiotics: Vanc 7/1 Cefazolin 7/2>>     Subjective:  Pt feeling a little better today, less pain.  Denies f/c, sob, n/v/d, abd pain Objective: Vitals:   09/27/22 0432 09/27/22 0828 09/27/22 1140 09/27/22 1257  BP: (!) 148/94 (!) 114/42 (!) 104/54 (!) 117/56  Pulse: 82 79 77 79  Resp: 20 (!) 24 (!) 24 18  Temp: 98 F (36.7 C) 99.7 F (37.6 C) 98.6 F (37 C) 97.9 F (36.6 C)  TempSrc:  Oral  Oral   SpO2: 100% 100% 100% 100%  Weight:      Height:        Intake/Output Summary (Last 24 hours) at 09/27/2022 1531 Last data filed at 09/27/2022 1500 Gross per 24 hour  Intake 2080.72 ml  Output 950 ml  Net 1130.72 ml   Weight change:  Exam:  General:  Pt is alert,  follows commands appropriately, not in acute distress HEENT: No icterus, No thrush, No neck mass, [place]/AT Cardiovascular: RRR, S1/S2, no rubs, no gallops Respiratory: CTA bilaterally, no wheezing, no crackles, no rhonchi Abdomen: Soft/+BS, non tender, non distended, no guarding Extremities: 1 + LE edema, No lymphangitis, No petechiae, No rashes, no synovitis   Data Reviewed: I have personally reviewed following labs and imaging studies Basic Metabolic Panel: Recent Labs  Lab 09/26/22 1159 09/27/22 0502  NA 133* 136  K 4.1 3.7  CL 99 99  CO2 26 28  GLUCOSE 177* 117*  BUN 11 15  CREATININE 0.68 0.90  CALCIUM 8.1* 8.1*  MG  --  1.9   Liver Function Tests: Recent Labs  Lab 09/26/22 1159  AST 22  ALT 17  ALKPHOS 91  BILITOT 0.8  PROT 5.8*  ALBUMIN 3.1*   No results for input(s): "LIPASE", "AMYLASE" in the last 168 hours. No results for input(s): "AMMONIA" in the last 168 hours. Coagulation Profile: Recent Labs  Lab 09/26/22 1159  INR 1.1   CBC: Recent Labs  Lab 09/26/22 1159 09/27/22 0502  WBC 4.9 4.4  NEUTROABS 4.0  --   HGB 10.2* 10.0*  HCT 32.1* 31.9*  MCV 98.8 99.4  PLT 89* 93*   Cardiac Enzymes: No results for input(s): "CKTOTAL", "CKMB", "CKMBINDEX", "TROPONINI" in the last 168 hours. BNP: Invalid input(s): "POCBNP" CBG: Recent Labs  Lab 09/26/22 1729 09/26/22 2048 09/27/22 0748 09/27/22 1123  GLUCAP 137* 126* 144* 143*   HbA1C: Recent Labs    09/26/22 1159  HGBA1C 4.7*   Urine analysis:    Component Value Date/Time   COLORURINE STRAW (A) 09/26/2022 1800   APPEARANCEUR CLEAR 09/26/2022 1800   APPEARANCEUR Cloudy (A) 10/19/2021 1223   LABSPEC 1.005 09/26/2022 1800   PHURINE 6.0 09/26/2022 1800   GLUCOSEU NEGATIVE 09/26/2022 1800   HGBUR SMALL (A) 09/26/2022 1800   HGBUR negative 07/23/2007 1049   BILIRUBINUR NEGATIVE 09/26/2022 1800   BILIRUBINUR Negative 10/19/2021 1223   KETONESUR NEGATIVE 09/26/2022 1800   PROTEINUR NEGATIVE  09/26/2022 1800   UROBILINOGEN 0.2 04/30/2015 1205   UROBILINOGEN 0.2 07/23/2007 1049   NITRITE NEGATIVE 09/26/2022 1800   LEUKOCYTESUR NEGATIVE 09/26/2022 1800   Sepsis Labs: @LABRCNTIP (procalcitonin:4,lacticidven:4) ) Recent Results (from the past 240 hour(s))  Blood Culture (routine x 2)     Status: None (Preliminary result)   Collection Time: 09/26/22 11:43 AM   Specimen: BLOOD  Result Value Ref Range Status   Specimen Description   Final    BLOOD BLOOD LEFT ARM Performed at [facility], [address], [place], [place] [address]    Special Requests   Final    BOTTLES DRAWN AEROBIC AND ANAEROBIC Blood Culture adequate volume Performed at [facility], [address] [place], [place] [address]    Culture  Setup Time   Final    GRAM POSITIVE COCCI IN CLUSTERS IN BOTH AEROBIC AND ANAEROBIC BOTTLES CRITICAL RESULT CALLED TO, READ BACK BY AND VERIFIED WITH: S RICHARDSON AT 0024 09/27/2022 BY T KENNEDY CRITICAL RESULT CALLED TO, READ BACK BY AND VERIFIED WITH: Berenda Morale RN 09/27/22 @  0410 BY AB Performed at [facility], [address], [place], [place] [address]    Culture GRAM POSITIVE COCCI IN CLUSTERS  Final   Report Status PENDING  Incomplete  Blood Culture ID Panel (Reflexed)     Status: Abnormal   Collection Time: 09/26/22 11:43 AM  Result Value Ref Range Status   Enterococcus faecalis NOT DETECTED NOT DETECTED Final   Enterococcus Faecium NOT DETECTED NOT DETECTED Final   Listeria monocytogenes NOT DETECTED NOT DETECTED Final   Staphylococcus species DETECTED (A) NOT DETECTED Final    Comment: CRITICAL RESULT CALLED TO, READ BACK BY AND VERIFIED WITH: S RICHARDSON RN 09/27/22 @ 0410 BY AB    Staphylococcus aureus (BCID) DETECTED (A) NOT DETECTED Final    Comment: CRITICAL RESULT CALLED TO, READ BACK BY AND VERIFIED WITH: S RICHARDSON RN 09/27/22 @ 0410 BY AB    Staphylococcus epidermidis NOT DETECTED NOT DETECTED Final   Staphylococcus lugdunensis NOT DETECTED  NOT DETECTED Final   Streptococcus species NOT DETECTED NOT DETECTED Final   Streptococcus agalactiae NOT DETECTED NOT DETECTED Final   Streptococcus pneumoniae NOT DETECTED NOT DETECTED Final   Streptococcus pyogenes NOT DETECTED NOT DETECTED Final   A.calcoaceticus-baumannii NOT DETECTED NOT DETECTED Final   Bacteroides fragilis NOT DETECTED NOT DETECTED Final   Enterobacterales NOT DETECTED NOT DETECTED Final   Enterobacter cloacae complex NOT DETECTED NOT DETECTED Final   Escherichia coli NOT DETECTED NOT DETECTED Final   Klebsiella aerogenes NOT DETECTED NOT DETECTED Final   Klebsiella oxytoca NOT DETECTED NOT DETECTED Final   Klebsiella pneumoniae NOT DETECTED NOT DETECTED Final   Proteus species NOT DETECTED NOT DETECTED Final   Salmonella species NOT DETECTED NOT DETECTED Final   Serratia marcescens NOT DETECTED NOT DETECTED Final   Haemophilus influenzae NOT DETECTED NOT DETECTED Final   Neisseria meningitidis NOT DETECTED NOT DETECTED Final   Pseudomonas aeruginosa NOT DETECTED NOT DETECTED Final   Stenotrophomonas maltophilia NOT DETECTED NOT DETECTED Final   Candida albicans NOT DETECTED NOT DETECTED Final   Candida auris NOT DETECTED NOT DETECTED Final   Candida glabrata NOT DETECTED NOT DETECTED Final   Candida krusei NOT DETECTED NOT DETECTED Final   Candida parapsilosis NOT DETECTED NOT DETECTED Final   Candida tropicalis NOT DETECTED NOT DETECTED Final   Cryptococcus neoformans/gattii NOT DETECTED NOT DETECTED Final   Meth resistant mecA/C and MREJ NOT DETECTED NOT DETECTED Final    Comment: Performed at [facility], [address], [place], [place] [address]  Blood Culture (routine x 2)     Status: None (Preliminary result)   Collection Time: 09/26/22 11:59 AM   Specimen: BLOOD  Result Value Ref Range Status   Specimen Description BLOOD BLOOD RIGHT HAND  Final   Special Requests   Final    Blood Culture adequate volume BOTTLES DRAWN AEROBIC AND ANAEROBIC    Culture  Setup Time   Final    GRAM POSITIVE COCCI IN CLUSTERS IN BOTH AEROBIC AND ANAEROBIC BOTTLES CRITICAL VALUE NOTED.  VALUE IS CONSISTENT WITH PREVIOUSLY REPORTED AND CALLED VALUE. Performed at [facility], [address], [place], [place] [address]    Culture GRAM POSITIVE COCCI IN CLUSTERS  Final   Report Status PENDING  Incomplete  Resp panel by RT-PCR (RSV, Flu A&B, Covid) Anterior Nasal Swab     Status: None   Collection Time: 09/26/22 12:12 PM   Specimen: Anterior Nasal Swab  Result Value Ref Range Status   SARS Coronavirus 2  by RT PCR NEGATIVE NEGATIVE Final    Comment: (NOTE) SARS-CoV-2 target nucleic acids are NOT DETECTED.  The SARS-CoV-2 RNA is generally detectable in upper respiratory specimens during the acute phase of infection. The lowest concentration of SARS-CoV-2 viral copies this assay can detect is 138 copies/mL. A negative result does not preclude SARS-Cov-2 infection and should not be used as the sole basis for treatment or other patient management decisions. A negative result may occur with  improper specimen collection/handling, submission of specimen other than nasopharyngeal swab, presence of viral mutation(s) within the areas targeted by this assay, and inadequate number of viral copies(<138 copies/mL). A negative result must be combined with clinical observations, patient history, and epidemiological information. The expected result is Negative.  Fact Sheet for Patients:  BloggerCourse.com  Fact Sheet for Healthcare Providers:  SeriousBroker.it  This test is no t yet approved or cleared by the [place] FDA and  has been authorized for detection and/or diagnosis of SARS-CoV-2 by FDA under an Emergency Use Authorization (EUA). This EUA will remain  in effect (meaning this test can be used) for the duration of the COVID-19 declaration under Section 564(b)(1) of the Act, 21 U.S.C.section  360bbb-3(b)(1), unless the authorization is terminated  or revoked sooner.       Influenza A by PCR NEGATIVE NEGATIVE Final   Influenza B by PCR NEGATIVE NEGATIVE Final    Comment: (NOTE) The Xpert Xpress SARS-CoV-2/FLU/RSV plus assay is intended as an aid in the diagnosis of influenza from Nasopharyngeal swab specimens and should not be used as a sole basis for treatment. Nasal washings and aspirates are unacceptable for Xpert Xpress SARS-CoV-2/FLU/RSV testing.  Fact Sheet for Patients: BloggerCourse.com  Fact Sheet for Healthcare Providers: SeriousBroker.it  This test is not yet approved or cleared by the [place] FDA and has been authorized for detection and/or diagnosis of SARS-CoV-2 by FDA under an Emergency Use Authorization (EUA). This EUA will remain in effect (meaning this test can be used) for the duration of the COVID-19 declaration under Section 564(b)(1) of the Act, 21 U.S.C. section 360bbb-3(b)(1), unless the authorization is terminated or revoked.     Resp Syncytial Virus by PCR NEGATIVE NEGATIVE Final    Comment: (NOTE) Fact Sheet for Patients: BloggerCourse.com  Fact Sheet for Healthcare Providers: SeriousBroker.it  This test is not yet approved or cleared by the [place] FDA and has been authorized for detection and/or diagnosis of SARS-CoV-2 by FDA under an Emergency Use Authorization (EUA). This EUA will remain in effect (meaning this test can be used) for the duration of the COVID-19 declaration under Section 564(b)(1) of the Act, 21 U.S.C. section 360bbb-3(b)(1), unless the authorization is terminated or revoked.  Performed at [facility], [address] [place], [place] [address]      Scheduled Meds:  acyclovir  400 mg Oral BID   Chlorhexidine Gluconate Cloth  6 each Topical Daily   furosemide  20 mg Oral BID   insulin aspart  0-9  Units Subcutaneous TID WC   pantoprazole  40 mg Oral Daily   pravastatin  20 mg Oral q1800   venlafaxine XR  150 mg Oral Q breakfast   Continuous Infusions:  0.9 % NaCl with KCl 20 mEq / L 75 mL/hr at 09/26/22 1714    ceFAZolin (ANCEF) IV 2 g (09/27/22 1339)    Procedures/Studies: ECHOCARDIOGRAM COMPLETE  Result Date: 09/27/2022    ECHOCARDIOGRAM REPORT   Patient Name:   SCHELBY LAFLASH Date of Exam: 09/27/2022  Medical Rec #:  [id number]          Height:       62.0 in Accession #:    [id number]         Weight:       280.0 lb Date of Birth:  [birth year]          BSA:          2.206 m Patient Age:    72 years           BP:           104/54 mmHg Patient Gender: F                  HR:           76 bpm. Exam Location:  [facility] Procedure: 2D Echo, Cardiac Doppler and Color Doppler Indications:    Bacteremia R78.81  History:        Patient has prior history of Echocardiogram examinations, most                 recent 06/03/2022. Sepsis; Risk Factors:Dyslipidemia, Former                 Smoker, Hypertension and Diabetes.  Sonographer:    Aron Baba Referring Phys: [id number] CORNELIUS N VAN DAM  Sonographer Comments: Patient is obese. Image acquisition challenging due to respiratory motion. IMPRESSIONS  1. Left ventricular ejection fraction, by estimation, is 65 to 70%. The left ventricle has normal function. The left ventricle has no regional wall motion abnormalities. There is mild left ventricular hypertrophy. Left ventricular diastolic parameters are consistent with Grade I diastolic dysfunction (impaired relaxation). Elevated left atrial pressure.  2. Right ventricular systolic function is normal. The right ventricular size is normal. There is mildly elevated pulmonary artery systolic pressure.  3. Left atrial size was mildly dilated.  4. Right atrial size was mildly dilated.  5. The mitral valve is abnormal. Mild mitral valve regurgitation.  6. The aortic valve has an indeterminant number of cusps. There is  moderate calcification of the aortic valve. There is moderate thickening of the aortic valve. Aortic valve regurgitation is not visualized. Mild aortic valve stenosis. Aortic valve mean gradient measures 18.0 mmHg. Aortic valve peak gradient measures 35.3 mmHg. Aortic valve area, by VTI measures 1.87 cm.  7. Aortic dilatation noted. There is mild dilatation of the ascending aorta, measuring 40 mm.  8. The inferior vena cava is dilated in size with >50% respiratory variability, suggesting right atrial pressure of 8 mmHg. FINDINGS  Left Ventricle: Left ventricular ejection fraction, by estimation, is 65 to 70%. The left ventricle has normal function. The left ventricle has no regional wall motion abnormalities. The left ventricular internal cavity size was normal in size. There is  mild left ventricular hypertrophy. Left ventricular diastolic parameters are consistent with Grade I diastolic dysfunction (impaired relaxation). Elevated left atrial pressure. Right Ventricle: The right ventricular size is normal. Right vetricular wall thickness was not well visualized. Right ventricular systolic function is normal. There is mildly elevated pulmonary artery systolic pressure. The tricuspid regurgitant velocity  is 2.41 m/s, and with an assumed right atrial pressure of 15 mmHg, the estimated right ventricular systolic pressure is 38.2 mmHg. Left Atrium: Left atrial size was mildly dilated. Right Atrium: Right atrial size was mildly dilated. Pericardium: There is no evidence of pericardial effusion. Mitral Valve: The mitral valve is abnormal. There is mild thickening of the mitral valve leaflet(s). There is mild  calcification of the mitral valve leaflet(s). Mild mitral annular calcification. Mild mitral valve regurgitation. Tricuspid Valve: The tricuspid valve is normal in structure. Tricuspid valve regurgitation is not demonstrated. No evidence of tricuspid stenosis. Aortic Valve: The aortic valve has an indeterminant  number of cusps. There is moderate calcification of the aortic valve. There is moderate thickening of the aortic valve. There is moderate aortic valve annular calcification. Aortic valve regurgitation is not visualized. Mild aortic stenosis is present. Aortic valve mean gradient measures 18.0 mmHg. Aortic valve peak gradient measures 35.3 mmHg. Aortic valve area, by VTI measures 1.87 cm. Pulmonic Valve: The pulmonic valve was not well visualized. Pulmonic valve regurgitation is not visualized. No evidence of pulmonic stenosis. Aorta: The aortic root is normal in size and structure and aortic dilatation noted. There is mild dilatation of the ascending aorta, measuring 40 mm. Venous: The inferior vena cava is dilated in size with greater than 50% respiratory variability, suggesting right atrial pressure of 8 mmHg. IAS/Shunts: The interatrial septum was not well visualized.  LEFT VENTRICLE PLAX 2D LVIDd:         4.40 cm   Diastology LVIDs:         3.20 cm   LV e' medial:    5.70 cm/s LV PW:         1.10 cm   LV E/e' medial:  20.7 LV IVS:        1.10 cm   LV e' lateral:   9.81 cm/s LVOT diam:     2.10 cm   LV E/e' lateral: 12.0 LV SV:         108 LV SV Index:   49 LVOT Area:     3.46 cm  RIGHT VENTRICLE RV S prime:     12.90 cm/s TAPSE (M-mode): 2.3 cm LEFT ATRIUM              Index        RIGHT ATRIUM           Index LA diam:        4.30 cm  1.95 cm/m   RA Area:     21.30 cm LA Vol (A2C):   65.1 ml  29.52 ml/m  RA Volume:   68.50 ml  31.06 ml/m LA Vol (A4C):   103.0 ml 46.70 ml/m LA Biplane Vol: 89.5 ml  40.58 ml/m  AORTIC VALVE AV Area (Vmax):    1.77 cm AV Area (Vmean):   1.90 cm AV Area (VTI):     1.87 cm AV Vmax:           297.00 cm/s AV Vmean:          191.333 cm/s AV VTI:            0.575 m AV Peak Grad:      35.3 mmHg AV Mean Grad:      18.0 mmHg LVOT Vmax:         152.00 cm/s LVOT Vmean:        105.000 cm/s LVOT VTI:          0.311 m LVOT/AV VTI ratio: 0.54  AORTA Ao Root diam: 3.60 cm Ao Asc diam:   4.00 cm MITRAL VALVE                TRICUSPID VALVE MV Area (PHT): 2.21 cm     TR Peak grad:   23.2 mmHg MV Decel Time: 343 msec     TR Vmax:  241.00 cm/s MV E velocity: 118.00 cm/s MV A velocity: 156.00 cm/s  SHUNTS MV E/A ratio:  0.76         Systemic VTI:  0.31 m                             Systemic Diam: 2.10 cm Dina Rich MD Electronically signed by Dina Rich MD Signature Date/Time: 09/27/2022/12:47:19 PM    Final    CT CHEST W CONTRAST  Result Date: 09/26/2022 CLINICAL DATA:  Soft tissue infection involving the right axilla and right flank EXAM: CT CHEST WITH CONTRAST TECHNIQUE: Multidetector CT imaging of the chest was performed during intravenous contrast administration. RADIATION DOSE REDUCTION: This exam was performed according to the departmental dose-optimization program which includes automated exposure control, adjustment of the mA and/or kV according to patient size and/or use of iterative reconstruction technique. CONTRAST:  75mL OMNIPAQUE IOHEXOL 300 MG/ML  SOLN COMPARISON:  PET-CT dated 11/18/2021 FINDINGS: Cardiovascular: The heart is normal in size. No pericardial effusion. No evidence of thoracic aortic aneurysm. Atherosclerotic calcifications of the aortic arch. Moderate three-vessel coronary atherosclerosis. Right chest port terminates at the cavoatrial junction. Mediastinum/Nodes: No suspicious mediastinal lymphadenopathy. Visualized thyroid is unremarkable. Lungs/Pleura: Evaluation lung parenchyma is constrained by respiratory motion. Within that constraint, there are patchy/ground-glass opacities in the lateral upper lobes (series 5/image 50), posterior right upper lobe along the major fissure (series 5/image 54) and anterior might middle lobe adjacent to the minor fissure (series 5/image 69) which are new from the prior and favor mild infection/pneumonia. Mild subpleural scarring/atelectasis in the lingula, chronic. Mild dependent atelectasis in the bilateral lower  lobes. Numerous hyperdense foci in the posterior lower lobes, favored sequela of prior aspiration. No pleural effusion or pneumothorax. Upper Abdomen: Visualized upper abdomen is notable for a nodular hepatic border, suggesting cirrhosis. Stable 3.2 cm left adrenal nodule, without hypermetabolism on PET, favoring a benign adrenal adenoma. Musculoskeletal: Cutaneous thickening along the right lateral chest wall ((series 2/image 81) with mild subcutaneous extending to the right axilla, where there is focal subcutaneous stranding/inflammation (series 2/image 44), suggesting cellulitis. However, there is no drainable fluid collection/abscess. Degenerative changes of the visualized thoracolumbar spine. No focal lytic lesions in this patient with history of multiple myeloma. IMPRESSION: Suspected cellulitis along the right axilla/lateral chest wall. No drainable fluid collection/abscess. Scattered mild patchy/ground-glass opacities in the lungs bilaterally, as described above, new from prior PET. Although poorly evaluated due to motion degradation, this appearance favors mild infection/pneumonia. Additional stable ancillary findings as above. Aortic Atherosclerosis (ICD10-I70.0). Electronically Signed   By: Charline Bills M.D.   On: 09/26/2022 22:00   DG Chest Port 1 View  Result Date: 09/26/2022 CLINICAL DATA:  Sepsis EXAM: PORTABLE CHEST 1 VIEW COMPARISON:  X-ray 05/19/2020 FINDINGS: Enlarged cardiopericardial silhouette. Calcified aorta. Stable interstitial changes. No pneumothorax, effusion or consolidation. Right IJ chest port in place with tip at the SVC right atrial junction. Overlapping cardiac leads. Slight left basilar scar or atelectasis. Osteopenia and degenerative changes. IMPRESSION: Slight left basilar scar or atelectasis. Enlarged heart with calcified aorta.  Chest port Electronically Signed   By: Karen Kays M.D.   On: 09/26/2022 12:29    Catarina Hartshorn, DO  [facility]  If 7PM-7AM, please  contact night-coverage [email or web address] Password [email or web address] 09/27/2022, 3:31 PM   LOS: 1 day

## 2022-09-27 NOTE — Progress Notes (Signed)
Mobility Specialist Progress Note:    09/27/22 1212  Mobility  Activity Ambulated with assistance to bathroom;Ambulated with assistance in hallway  Level of Assistance Contact guard assist, steadying assist  Assistive Device Front wheel walker  Distance Ambulated (ft) 75 ft  Range of Motion/Exercises Active;All extremities  Activity Response Tolerated well  Mobility Referral Yes  $Mobility charge 1 Mobility  Mobility Specialist Start Time (ACUTE ONLY) 1200  Mobility Specialist Stop Time (ACUTE ONLY) 1215  Mobility Specialist Time Calculation (min) (ACUTE ONLY) 15 min     Pt received in bed, agreeable to mobility session. Ambulated in hallway and to/from bathroom via RW. Tolerated well, audible SOB throughout, SpO2 98% on 3L during mobility. Returned pt to room, all needs met.   Feliciana Rossetti Mobility Specialist Please contact via Special educational needs teacher or  Rehab office at [phone]

## 2022-09-27 NOTE — Consult Note (Signed)
Virtual Visit via Video Note  I connected with Savannah Burns on @TODAY @ at  by a video enabled telemedicine application and verified that I am speaking with the correct person using two identifiers.  Location: Patient: Savannah Burns [address] Provider: My Home office   I discussed the limitations of evaluation and management by telemedicine and the availability of in person appointments. The patient expressed understanding and agreed to proceed.             Date of Admission:  09/26/2022          Reason for Consult: MSSA bacteremia likely due to soft tissue infection now with Port-A-Cath that is undoubtedly seeded    Referring Provider: Connye Burkitt "auto consult" and Catarina Hartshorn, MD   Assessment:  MSSA bacteremia likely from soft tissue infection Port-A-Cath which has been seeded Multiple myeloma Morbid obesity Chronic respiratory failure on 3 L via nasal cannula with COPD history Diabetes mellitus  Plan:  Continue cefazolin Would consult surgery for removal of her Port-A-Cath and evaluation of her axillary area for I&D and source control Repeat blood cultures after port is removed Follow-up on 2D echocardiogram results I have some reticence about obtaining a TEE and her given her chronic COPD and respiratory failure.  It may be more prudent to err on the side of empiric treatment rather than trying to exclude endocarditis Chemotherapy should be on hold while we are attempt to eradicate this infection  Principal Problem:   MSSA bacteremia Active Problems:   Multiple myeloma (HCC)   Sepsis due to undetermined organism (HCC)   Obesity, Class III, BMI 40-49.9 (morbid obesity) (HCC)   Chronic respiratory failure with hypoxia (HCC)   Thrombocytopenia (HCC)   Cellulitis of axilla, right   Sepsis (HCC)   Sepsis due to Staphylococcus aureus (HCC)   Staphylococcus aureus bacteremia   Scheduled Meds:  acyclovir  400 mg Oral BID   Chlorhexidine Gluconate Cloth  6 each  Topical Daily   insulin aspart  0-9 Units Subcutaneous TID WC   pantoprazole  40 mg Oral Daily   pravastatin  20 mg Oral q1800   venlafaxine XR  150 mg Oral Q breakfast   Continuous Infusions:   ceFAZolin (ANCEF) IV 2 g (09/27/22 1339)   PRN Meds:.acetaminophen **OR** acetaminophen, ondansetron **OR** ondansetron (ZOFRAN) IV, oxyCODONE-acetaminophen **AND** oxyCODONE  HPI: Savannah Burns is a 72 y.o. female with history of multiple myeloma followed by Dr.Katragadda, having been on lenalidomide and dexamethasone with Port-a-cath in place, NASH cirrhosis diabetes mellitus hypertension hyperlipidemia COPD with chronic respiratory failure on 3 L chronically who has been struggling with boils along her flank and axilla.  She was given Bactrim twice daily for 7 days but did not have improvement she was then prescribed cephalexin but then was developing nausea and vomiting and then was changed to doxycycline.  She said that the area on her right axilla did burst and drained some pus and diminished in size somewhat but she began to feel increasingly more weak and was unable to get out of bed.  She came to the ER where she was found to be febrile and tachycardic.  Blood cultures were taken and they have subsequently yielded MSSA.  She clearly still has a fluctuant area on her right axilla and side which I suspect harbors an abscess that requires I&D.  In the ER CT scan of the chest fails to show shows an abscess but I have found that CT scans and MRI  scans can be quite insensitive in obese patients such as this 1.  Additionally she has a Port-A-Cath in place which while not being the likely source of her bacteremia is undoubtably now seeded with Staphylococcus aureus and needs to be required to facilitate cure of her bacteremia.  On video feed today I can see the erythema and fluctuant area in her right axillary area.  Port-A-Cath itself appeared clean.  She is not complaining of pain in other  joints and has had no worsening of her chronic back pain where she has hardware placed.   I discussed the assessment and treatment plan with the patient. The patient was provided an opportunity to ask questions and all were answered. The patient agreed with the plan and demonstrated an understanding of the instructions.   The patient was advised to call back or seek an in-person evaluation if the symptoms worsen or if the condition fails to improve as anticipated.  I have personally spent 84 minutes involved in face-to-face and non-face-to-face activities for this patient on the day of the visit. Professional time spent includes the following activities: Preparing to see the patient (review of tests), Obtaining and/or reviewing separately obtained history (admission/discharge record), Performing a medically appropriate examination and/or evaluation , Ordering medications/tests/procedures, referring and communicating with other health care professionals, Documenting clinical information in the EMR, Independently interpreting results (not separately reported), Communicating results to the patient/family/caregiver, Counseling and educating the patient/family/caregiver and Care coordination (not separately reported). -  Review of Systems: Review of Systems  Constitutional:  Positive for chills, fever and malaise/fatigue. Negative for weight loss.  HENT:  Negative for congestion and sore throat.   Eyes:  Negative for blurred vision and photophobia.  Respiratory:  Negative for cough, shortness of breath and wheezing.   Cardiovascular:  Negative for chest pain, palpitations and leg swelling.  Gastrointestinal:  Negative for abdominal pain, blood in stool, constipation, diarrhea, heartburn, melena, nausea and vomiting.  Genitourinary:  Negative for dysuria, flank pain and hematuria.  Musculoskeletal:  Negative for back pain, falls, joint pain and myalgias.  Skin:  Negative for itching and rash.   Neurological:  Positive for weakness. Negative for dizziness, focal weakness, loss of consciousness and headaches.  Endo/Heme/Allergies:  Does not bruise/bleed easily.  Psychiatric/Behavioral:  Negative for depression and suicidal ideas. The patient does not have insomnia.     Past Medical History:  Diagnosis Date   Adrenal adenoma, left    Stable   Anxiety    Arthritis    bilateral hands   Depression    Diabetes mellitus, type 2 (HCC) 08/12/2008   Qualifier: Diagnosis of  By: Dayton Martes MD, Talia     Dyspnea    Esophageal varices (HCC)    Grade II diastolic dysfunction    History of kidney stones    Hyperlipidemia    Hypertension    Lower back pain    Lower GI bleed 03/19/2020   Panic attacks    Pneumonia    currently taking antibiotic and prednisone for early stages of pneumonia   Pulmonary nodules    bilateral   Skin cancer    face    Social History   Tobacco Use   Smoking status: Former    Packs/day: 1.50    Years: 40.00    Additional pack years: 0.00    Total pack years: 60.00    Types: Cigarettes    Quit date: 04/29/2015    Years since quitting: 7.4   Smokeless tobacco: Never  Tobacco comments:    Quit smoking 04/2015- Previous 1.5 ppd smoker  Vaping Use   Vaping Use: Never used  Substance Use Topics   Alcohol use: No    Alcohol/week: 0.0 standard drinks of alcohol   Drug use: No    Family History  Problem Relation Age of Onset   Diabetes Father    Heart disease Father 30       MI   Hypertension Father    Anemia Mother        Transfusion dependent   COPD Sister    Cancer Paternal Grandmother 61       Pancreatic   Allergies  Allergen Reactions   Keflex [Cephalexin] Nausea And Vomiting    OBJECTIVE: Blood pressure (!) 126/95, pulse 92, temperature 98.6 F (37 C), temperature source Oral, resp. rate 20, height 5\' 2"  (1.575 m), weight 127 kg, SpO2 100 %.  Physical Exam Constitutional:      General: She is not in acute distress.    Appearance:  Normal appearance. She is well-developed. She is not ill-appearing or diaphoretic.  HENT:     Head: Normocephalic and atraumatic.     Right Ear: Hearing and external ear normal.     Left Ear: Hearing and external ear normal.     Nose: No nasal deformity.  Eyes:     General: No scleral icterus.    Conjunctiva/sclera: Conjunctivae normal.     Right eye: Right conjunctiva is not injected.     Left eye: Left conjunctiva is not injected.  Neck:     Vascular: No JVD.  Cardiovascular:     Rate and Rhythm: Normal rate and regular rhythm.     Heart sounds: S1 normal and S2 normal.  Pulmonary:     Effort: Pulmonary effort is normal.  Abdominal:     Palpations: Abdomen is soft.  Musculoskeletal:     Right shoulder: Normal.     Left shoulder: Normal.     Cervical back: Normal range of motion and neck supple.     Right hip: Normal.     Left hip: Normal.     Right knee: Normal.     Left knee: Normal.  Lymphadenopathy:     Head:     Right side of head: No submandibular, preauricular or posterior auricular adenopathy.     Left side of head: No submandibular, preauricular or posterior auricular adenopathy.     Cervical: No cervical adenopathy.     Right cervical: No superficial or deep cervical adenopathy.    Left cervical: No superficial or deep cervical adenopathy.  Skin:    General: Skin is warm and dry.     Coloration: Skin is pale.     Findings: No abrasion or ecchymosis.     Nails: There is no clubbing.  Neurological:     General: No focal deficit present.     Mental Status: She is alert and oriented to person, place, and time.     Sensory: No sensory deficit.     Coordination: Coordination normal.     Gait: Gait normal.  Psychiatric:        Attention and Perception: She is attentive.        Mood and Affect: Mood normal.        Speech: Speech normal.        Behavior: Behavior normal. Behavior is cooperative.        Thought Content: Thought content normal.        Judgment:  Judgment normal.    Fluctuant area along the right axillary line observed  Lab Results Lab Results  Component Value Date   WBC 4.4 09/27/2022   HGB 10.0 (L) 09/27/2022   HCT 31.9 (L) 09/27/2022   MCV 99.4 09/27/2022   PLT 93 (L) 09/27/2022    Lab Results  Component Value Date   CREATININE 0.90 09/27/2022   BUN 15 09/27/2022   NA 136 09/27/2022   K 3.7 09/27/2022   CL 99 09/27/2022   CO2 28 09/27/2022    Lab Results  Component Value Date   ALT 17 09/26/2022   AST 22 09/26/2022   ALKPHOS 91 09/26/2022   BILITOT 0.8 09/26/2022     Microbiology: Recent Results (from the past 240 hour(s))  Blood Culture (routine x 2)     Status: None (Preliminary result)   Collection Time: 09/26/22 11:43 AM   Specimen: BLOOD  Result Value Ref Range Status   Specimen Description   Final    BLOOD BLOOD LEFT ARM Performed at [facility], [address] [place], [place] [address]    Special Requests   Final    BOTTLES DRAWN AEROBIC AND ANAEROBIC Blood Culture adequate volume Performed at [facility], [address], [place], [place] [address]    Culture  Setup Time   Final    GRAM POSITIVE COCCI IN CLUSTERS IN BOTH AEROBIC AND ANAEROBIC BOTTLES CRITICAL RESULT CALLED TO, READ BACK BY AND VERIFIED WITH: S RICHARDSON AT 0024 09/27/2022 BY T KENNEDY CRITICAL RESULT CALLED TO, READ BACK BY AND VERIFIED WITH: Berenda Morale RN 09/27/22 @ 0410 BY AB Performed at [facility], [address], [place], [place] [address]    Culture GRAM POSITIVE COCCI IN CLUSTERS  Final   Report Status PENDING  Incomplete  Blood Culture ID Panel (Reflexed)     Status: Abnormal   Collection Time: 09/26/22 11:43 AM  Result Value Ref Range Status   Enterococcus faecalis NOT DETECTED NOT DETECTED Final   Enterococcus Faecium NOT DETECTED NOT DETECTED Final   Listeria monocytogenes NOT DETECTED NOT DETECTED Final   Staphylococcus species DETECTED (A) NOT DETECTED Final    Comment: CRITICAL RESULT CALLED TO,  READ BACK BY AND VERIFIED WITH: S RICHARDSON RN 09/27/22 @ 0410 BY AB    Staphylococcus aureus (BCID) DETECTED (A) NOT DETECTED Final    Comment: CRITICAL RESULT CALLED TO, READ BACK BY AND VERIFIED WITH: S RICHARDSON RN 09/27/22 @ 0410 BY AB    Staphylococcus epidermidis NOT DETECTED NOT DETECTED Final   Staphylococcus lugdunensis NOT DETECTED NOT DETECTED Final   Streptococcus species NOT DETECTED NOT DETECTED Final   Streptococcus agalactiae NOT DETECTED NOT DETECTED Final   Streptococcus pneumoniae NOT DETECTED NOT DETECTED Final   Streptococcus pyogenes NOT DETECTED NOT DETECTED Final   A.calcoaceticus-baumannii NOT DETECTED NOT DETECTED Final   Bacteroides fragilis NOT DETECTED NOT DETECTED Final   Enterobacterales NOT DETECTED NOT DETECTED Final   Enterobacter cloacae complex NOT DETECTED NOT DETECTED Final   Escherichia coli NOT DETECTED NOT DETECTED Final   Klebsiella aerogenes NOT DETECTED NOT DETECTED Final   Klebsiella oxytoca NOT DETECTED NOT DETECTED Final   Klebsiella pneumoniae NOT DETECTED NOT DETECTED Final   Proteus species NOT DETECTED NOT DETECTED Final   Salmonella species NOT DETECTED NOT DETECTED Final   Serratia marcescens NOT DETECTED NOT DETECTED Final   Haemophilus influenzae NOT DETECTED NOT DETECTED Final   Neisseria meningitidis NOT DETECTED NOT DETECTED Final   Pseudomonas aeruginosa  NOT DETECTED NOT DETECTED Final   Stenotrophomonas maltophilia NOT DETECTED NOT DETECTED Final   Candida albicans NOT DETECTED NOT DETECTED Final   Candida auris NOT DETECTED NOT DETECTED Final   Candida glabrata NOT DETECTED NOT DETECTED Final   Candida krusei NOT DETECTED NOT DETECTED Final   Candida parapsilosis NOT DETECTED NOT DETECTED Final   Candida tropicalis NOT DETECTED NOT DETECTED Final   Cryptococcus neoformans/gattii NOT DETECTED NOT DETECTED Final   Meth resistant mecA/C and MREJ NOT DETECTED NOT DETECTED Final    Comment: Performed at [facility], [address], [place], [place] [address]  Blood Culture (routine x 2)     Status: None (Preliminary result)   Collection Time: 09/26/22 11:59 AM   Specimen: BLOOD  Result Value Ref Range Status   Specimen Description BLOOD BLOOD RIGHT HAND  Final   Special Requests   Final    Blood Culture adequate volume BOTTLES DRAWN AEROBIC AND ANAEROBIC   Culture  Setup Time   Final    GRAM POSITIVE COCCI IN CLUSTERS IN BOTH AEROBIC AND ANAEROBIC BOTTLES CRITICAL VALUE NOTED.  VALUE IS CONSISTENT WITH PREVIOUSLY REPORTED AND CALLED VALUE. Performed at [facility], [address] [place], [place] [address]    Culture GRAM POSITIVE COCCI IN CLUSTERS  Final   Report Status PENDING  Incomplete  Resp panel by RT-PCR (RSV, Flu A&B, Covid) Anterior Nasal Swab     Status: None   Collection Time: 09/26/22 12:12 PM   Specimen: Anterior Nasal Swab  Result Value Ref Range Status   SARS Coronavirus 2 by RT PCR NEGATIVE NEGATIVE Final    Comment: (NOTE) SARS-CoV-2 target nucleic acids are NOT DETECTED.  The SARS-CoV-2 RNA is generally detectable in upper respiratory specimens during the acute phase of infection. The lowest concentration of SARS-CoV-2 viral copies this assay can detect is 138 copies/mL. A negative result does not preclude SARS-Cov-2 infection and should not be used as the sole basis for treatment or other patient management decisions. A negative result may occur with  improper specimen collection/handling, submission of specimen other than nasopharyngeal swab, presence of viral mutation(s) within the areas targeted by this assay, and inadequate number of viral copies(<138 copies/mL). A negative result must be combined with clinical observations, patient history, and epidemiological information. The expected result is Negative.  Fact Sheet for Patients:  BloggerCourse.com  Fact Sheet for Healthcare Providers:  SeriousBroker.it  This  test is no t yet approved or cleared by the [place] FDA and  has been authorized for detection and/or diagnosis of SARS-CoV-2 by FDA under an Emergency Use Authorization (EUA). This EUA will remain  in effect (meaning this test can be used) for the duration of the COVID-19 declaration under Section 564(b)(1) of the Act, 21 U.S.C.section 360bbb-3(b)(1), unless the authorization is terminated  or revoked sooner.       Influenza A by PCR NEGATIVE NEGATIVE Final   Influenza B by PCR NEGATIVE NEGATIVE Final    Comment: (NOTE) The Xpert Xpress SARS-CoV-2/FLU/RSV plus assay is intended as an aid in the diagnosis of influenza from Nasopharyngeal swab specimens and should not be used as a sole basis for treatment. Nasal washings and aspirates are unacceptable for Xpert Xpress SARS-CoV-2/FLU/RSV testing.  Fact Sheet for Patients: BloggerCourse.com  Fact Sheet for Healthcare Providers: SeriousBroker.it  This test is not yet approved or cleared by the [place] FDA and has been authorized for detection and/or diagnosis of SARS-CoV-2 by FDA under an Emergency  Use Authorization (EUA). This EUA will remain in effect (meaning this test can be used) for the duration of the COVID-19 declaration under Section 564(b)(1) of the Act, 21 U.S.C. section 360bbb-3(b)(1), unless the authorization is terminated or revoked.     Resp Syncytial Virus by PCR NEGATIVE NEGATIVE Final    Comment: (NOTE) Fact Sheet for Patients: BloggerCourse.com  Fact Sheet for Healthcare Providers: SeriousBroker.it  This test is not yet approved or cleared by the [place] FDA and has been authorized for detection and/or diagnosis of SARS-CoV-2 by FDA under an Emergency Use Authorization (EUA). This EUA will remain in effect (meaning this test can be used) for the duration of the COVID-19 declaration under  Section 564(b)(1) of the Act, 21 U.S.C. section 360bbb-3(b)(1), unless the authorization is terminated or revoked.  Performed at [facility], [address], [place], [place] [address]     Acey Lav, MD [facility] [phone] pager  09/27/2022, 7:39 PM

## 2022-09-28 ENCOUNTER — Encounter (HOSPITAL_COMMUNITY)
Admission: EM | Disposition: A | Discharge status: Home Health | Payer: Self-pay | Source: Home / Self Care | Attending: Internal Medicine

## 2022-09-28 DIAGNOSIS — L03111 Cellulitis of right axilla: Secondary | ICD-10-CM | POA: Diagnosis not present

## 2022-09-28 DIAGNOSIS — C9000 Multiple myeloma not having achieved remission: Secondary | ICD-10-CM | POA: Diagnosis not present

## 2022-09-28 DIAGNOSIS — L02419 Cutaneous abscess of limb, unspecified: Secondary | ICD-10-CM

## 2022-09-28 DIAGNOSIS — B9561 Methicillin susceptible Staphylococcus aureus infection as the cause of diseases classified elsewhere: Secondary | ICD-10-CM | POA: Diagnosis not present

## 2022-09-28 DIAGNOSIS — R7881 Bacteremia: Secondary | ICD-10-CM | POA: Diagnosis not present

## 2022-09-28 DIAGNOSIS — J9611 Chronic respiratory failure with hypoxia: Secondary | ICD-10-CM | POA: Diagnosis not present

## 2022-09-28 DIAGNOSIS — A419 Sepsis, unspecified organism: Secondary | ICD-10-CM | POA: Diagnosis not present

## 2022-09-28 DIAGNOSIS — T80212A Local infection due to central venous catheter, initial encounter: Secondary | ICD-10-CM

## 2022-09-28 HISTORY — PX: IRRIGATION AND DEBRIDEMENT ABSCESS: SHX5252

## 2022-09-28 HISTORY — PX: PORT-A-CATH REMOVAL: SHX5289

## 2022-09-28 LAB — GLUCOSE, CAPILLARY
Glucose-Capillary: 117 mg/dL — ABNORMAL HIGH (ref 70–99)
Glucose-Capillary: 121 mg/dL — ABNORMAL HIGH (ref 70–99)
Glucose-Capillary: 105 mg/dL — ABNORMAL HIGH (ref 70–99)
Glucose-Capillary: 107 mg/dL — ABNORMAL HIGH (ref 70–99)

## 2022-09-28 LAB — AEROBIC/ANAEROBIC CULTURE W GRAM STAIN (SURGICAL/DEEP WOUND)

## 2022-09-28 LAB — CREATININE, SERUM
Creatinine, Ser: 0.63 mg/dL (ref 0.44–1.00)
GFR, Estimated: >60 mL/min (ref >60)

## 2022-09-28 LAB — CULTURE, BLOOD (ROUTINE X 2): Special Requests: ADEQUATE

## 2022-09-28 SURGERY — MINOR REMOVAL PORT-A-CATH
Anesthesia: LOCAL | Site: Axilla | Laterality: Right

## 2022-09-28 MED ORDER — SODIUM CHLORIDE 0.9 % IV BOLUS: 500.0000 mL | Freq: Once | INTRAVENOUS | Status: DC

## 2022-09-28 MED ORDER — LINEZOLID 600 MG PO TABS
600.0000 mg | ORAL_TABLET | Freq: Two times a day (BID) | ORAL | Status: DC
  Administered 2022-09-28 – 2022-10-03 (×10): 600 mg via ORAL
  Filled 2022-09-28 (×14): qty 1

## 2022-09-28 SURGICAL SUPPLY — 16 items
ADH SKN CLS APL DERMABOND .7 (GAUZE/BANDAGES/DRESSINGS) ×2
APL PRP STRL LF ISPRP CHG 10.5 (MISCELLANEOUS)
APPLICATOR CHLORAPREP 10.5 ORG (MISCELLANEOUS) IMPLANT
CLOTH BEACON ORANGE TIMEOUT ST (SAFETY) ×3 IMPLANT
DECANTER SPIKE VIAL GLASS SM (MISCELLANEOUS) ×3 IMPLANT
DERMABOND ADVANCED .7 DNX12 (GAUZE/BANDAGES/DRESSINGS) ×3 IMPLANT
DRAPE HALF SHEET 40X57 (DRAPES) IMPLANT
GLOVE BIOGEL PI IND STRL 6.5 (GLOVE) ×3 IMPLANT
GLOVE BIOGEL PI IND STRL 7.0 (GLOVE) ×6 IMPLANT
GLOVE SURG SS PI 6.5 STRL IVOR (GLOVE) ×3 IMPLANT
GOWN STRL REUS W/TWL LRG LVL3 (GOWN DISPOSABLE) IMPLANT
POSITIONER HEAD 8X9X4 ADT (SOFTGOODS) ×3 IMPLANT
SPONGE GAUZE 2X2 8PLY STRL LF (GAUZE/BANDAGES/DRESSINGS) ×3 IMPLANT
SUT MNCRL AB 4-0 PS2 18 (SUTURE) ×3 IMPLANT
SUT VIC AB 3-0 SH 27 (SUTURE) ×2
SUT VIC AB 3-0 SH 27X BRD (SUTURE) ×3 IMPLANT

## 2022-09-28 NOTE — Assessment & Plan Note (Signed)
No signs of exacerbation, continue supplemental 02 per [place]

## 2022-09-28 NOTE — Hospital Course (Addendum)
Mrs. Savannah Burns was admitted to the hospital with the working diagnosis of skin abscess complicated with MSSA bacteremia complicated with sepsis present on admission.   72 yo female with the past medical history of NASH cirrhosis, T2DM, hypertension, dyslipidemia, COPD, multiple myeloma, and depression who presented with cellulitis and abscess right flank and axilla. She was diagnosed with skin infection about 2 weeks prior to admission and was treated with Bactrim. Her symptoms did not improved and antibiotic was changed to cephalexin, and because of nausea and vomiting was further changed to doxycyline. Unfortunately her symptoms continue to worsen, developing fever and generalized weakness prompting her to come to the ED.  On her initial physical examination her temp was 100,6, and she was tachycardic.   CT chest with cellulitis along the right axilla/ lateral chest wall. No drainable fluid collection or abscess.

## 2022-09-28 NOTE — Assessment & Plan Note (Signed)
Calculated BMI is 51.1

## 2022-09-28 NOTE — Op Note (Signed)
[facility] Operative Note  09/28/22  Preoperative Diagnosis: MSSA bacteremia, right axillary abscess   Postoperative Diagnosis: MSSA bacteremia, right axillary wound with cellulitis   Procedure(s) Performed: Removal of right internal jugular Port-A-Cath; incision and drainage of right axillary wound with cellulitis   Surgeon: Theophilus Kinds, DO    Assistants: Kristie Cowman, MS3   Anesthesia: Local   Anesthesiologist: No responsible provider has been recorded for the case.    Specimens: None   Estimated Blood Loss: Minimal   Blood Replacement: None    Complications: None   Wound Class: Dirty/infected   Operative Indications: Patient is a 72 year old female who was admitted to the hospital with weakness.  She was noted to have an MSSA bacteremia, and recommendation from ID was to remove her right IJ Port-A-Cath.  She also had right axillary pain with an associated wound and concern for abscess.  No abscess was noted on CT scan, however there was underlying induration.  Patient is agreeable to both procedures.  All risks and benefits of performing this procedure were discussed with the patient including pain, infection, bleeding, damage to the surrounding structures, and need for more procedures or surgery. The patient voiced understanding of the procedure, all questions were sought and answered, and consent was obtained.  Findings: Right IJ Port-A-Cath completely removed, no significant purulence or infection at incision sites; right axillary wound incised with surrounding inflammation of fat without fluid collection   Procedure: The patient was taken to the minor procedure room and placed supine.  No anesthesia was induced, as procedure was performed under local anesthetic.  Patient is receiving IV antibiotics on the floor.  The right chest and axilla were prepared and draped in the usual sterile fashion. A time-out was completed verifying correct patient,  procedure, site, positioning, and implant(s) and/or special equipment prior to beginning this procedure.  The area overlying the right IJ Port-A-Cath site was localized with 1% lidocaine. Incision was then made through the previous scar. Using electrocautery, the subcutaneous tissues were dissected down to the Port-A-Cath. The Port-A-Cath was grasped with hemostat and removed from the pocket. While pressure was being held at the right IJ insertion site, the Port-A-Cath was removed. It was noted to be completely intact. The tunnel was closed with 3-0 Vicryl in a figure-of-eight fashion. Hemostasis was achieved. The incision was then closed with 3-0 Vicryl subcuticular stitches.  This area was dressed with 4 x 4 and Medipore tape.  Attention was then turned to the right axillary wound.  The area was localized with 1% lidocaine.  Using scalpel, the overlying fibrinous exudate was excised.  Incision was extended into the underlying induration.  There was no purulent drainage noted.  Wound culture was obtained.  Hemostasis was achieved with electrocautery.  The wound was then packed with quarter inch iodoform gauze.  It was dressed with 4 x 4 and Medipore tape.  Final inspection revealed acceptable hemostasis.  The patient tolerated the procedure without complication.  The patient went was returned to the floor shortly after the procedure.   Theophilus Kinds, DO  [facility] [address] [place], [place] [address] [phone] (office)

## 2022-09-28 NOTE — Assessment & Plan Note (Addendum)
Chronic anemia and thrombocytopenia.  Chemotherapy on hold for now.   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

## 2022-09-28 NOTE — Assessment & Plan Note (Addendum)
Axillary abscess.  MSSA bacteremia.   07/03 axillary I&D plus Port removed Echocardiogram with no vegetations.  Follow up cultures with no growth.   Wbc 2.8   Clinically continue to improve.   Plan to continue antibiotic therapy with oral linezolid while line [place].  No TEE due to comorbid conditions.  Patient will need 4 weeks of IV antibiotic therapy, with cephazolin.  Will follow up with ID timing for new IV.

## 2022-09-28 NOTE — Progress Notes (Signed)
Mobility Specialist Progress Note:    09/28/22 1100  Mobility  Activity Ambulated with assistance in hallway  Level of Assistance Standby assist, set-up cues, supervision of patient - no hands on  Assistive Device Front wheel walker  Distance Ambulated (ft) 70 ft  Range of Motion/Exercises Active;All extremities  Activity Response Tolerated well  Mobility Referral Yes  $Mobility charge 1 Mobility  Mobility Specialist Start Time (ACUTE ONLY) 1045  Mobility Specialist Stop Time (ACUTE ONLY) 1100  Mobility Specialist Time Calculation (min) (ACUTE ONLY) 15 min   Pt received in bed, agreeable to mobility session. Tolerated well, audible SOB near end of session, SpO2 95% on 3L. Returned pt to room, sitting EOB, recovered SpO2 99% on 3L. Assisted pt to supine position in bed, all needs met, alarm on, call bell in reach.   Feliciana Rossetti Mobility Specialist Please contact via Special educational needs teacher or  Rehab office at [phone]

## 2022-09-28 NOTE — Progress Notes (Signed)
Virtual Visit via Video Note  I connected with Savannah Burns on 09/28/2022 by a video enabled telemedicine application and verified that I am speaking with the correct person using two identifiers.  Location: Patient:  [facility] Provider: Home Office   I discussed the limitations of evaluation and management by telemedicine and the availability of in person appointments. The patient expressed understanding and agreed to proceed. tions:      Subjective: No Savannah complaints   Antibiotics:  Anti-infectives (From admission, onward)    Start     Dose/Rate Route Frequency Ordered Stop   09/28/22 2200  linezolid (ZYVOX) tablet 600 mg        600 mg Oral Every 12 hours 09/28/22 1612     09/27/22 1400  vancomycin (VANCOREADY) IVPB 1750 mg/350 mL  Status:  Discontinued        1,750 mg 175 mL/hr over 120 Minutes Intravenous Every 24 hours 09/26/22 1249 09/27/22 0731   09/27/22 0830  ceFAZolin (ANCEF) IVPB 2g/100 mL premix  Status:  Discontinued        2 g 200 mL/hr over 30 Minutes Intravenous Every 8 hours 09/27/22 0731 09/28/22 1612   09/26/22 2200  acyclovir (ZOVIRAX) tablet 400 mg        400 mg Oral 2 times daily 09/26/22 1614     09/26/22 1245  vancomycin (VANCOREADY) IVPB 2000 mg/400 mL        2,000 mg 200 mL/hr over 120 Minutes Intravenous  Once 09/26/22 1237 09/26/22 1503   09/26/22 1230  vancomycin (VANCOCIN) IVPB 1000 mg/200 mL premix  Status:  Discontinued        1,000 mg 200 mL/hr over 60 Minutes Intravenous  Once 09/26/22 1218 09/26/22 1218       Medications: Scheduled Meds:  acyclovir  400 mg Oral BID   Chlorhexidine Gluconate Cloth  6 each Topical Daily   insulin aspart  0-9 Units Subcutaneous TID WC   linezolid  600 mg Oral Q12H   pantoprazole  40 mg Oral Daily   pravastatin  20 mg Oral q1800   venlafaxine XR  150 mg Oral Q breakfast   Continuous Infusions: PRN Meds:.acetaminophen **OR** acetaminophen, ondansetron **OR** ondansetron (ZOFRAN) IV,  oxyCODONE-acetaminophen **AND** oxyCODONE    Objective: Weight change:   Intake/Output Summary (Last 24 hours) at 09/28/2022 1646 Last data filed at 09/28/2022 1600 Gross per 24 hour  Intake 360 ml  Output 1500 ml  Net -1140 ml   Blood pressure (!) 120/57, pulse 80, temperature 98.6 F (37 C), resp. rate 18, height 5\' 2"  (1.575 m), weight 127 kg, SpO2 99 %. Temp:  [98 F (36.7 C)-101.4 F (38.6 C)] 98.6 F (37 C) (07/03 1345) Pulse Rate:  [73-85] 80 (07/03 1345) Resp:  [18-20] 18 (07/03 1345) BP: (120-140)/(57-65) 120/57 (07/03 1345) SpO2:  [97 %-100 %] 99 % (07/03 1345)  Physical Exam: Physical Exam   CBC:    BMET Recent Labs    09/26/22 1159 09/27/22 0502 09/28/22 0449  NA 133* 136  --   K 4.1 3.7  --   CL 99 99  --   CO2 26 28  --   GLUCOSE 177* 117*  --   BUN 11 15  --   CREATININE 0.68 0.90 0.63  CALCIUM 8.1* 8.1*  --      Liver Panel  Recent Labs    09/26/22 1159  PROT 5.8*  ALBUMIN 3.1*  AST 22  ALT 17  ALKPHOS 91  BILITOT 0.8       Sedimentation Rate No results for input(s): "ESRSEDRATE" in the last 72 hours. C-Reactive Protein No results for input(s): "CRP" in the last 72 hours.  Micro Results: Recent Results (from the past 720 hour(s))  Blood Culture (routine x 2)     Status: Abnormal (Preliminary result)   Collection Time: 09/26/22 11:43 AM   Specimen: BLOOD  Result Value Ref Range Status   Specimen Description   Final    BLOOD BLOOD LEFT ARM Performed at [facility], [address], [place], [place] [address]    Special Requests   Final    BOTTLES DRAWN AEROBIC AND ANAEROBIC Blood Culture adequate volume Performed at [facility], [address] [place], [place] [address]    Culture  Setup Time   Final    GRAM POSITIVE COCCI IN CLUSTERS IN BOTH AEROBIC AND ANAEROBIC BOTTLES CRITICAL RESULT CALLED TO, READ BACK BY AND VERIFIED WITH: S RICHARDSON AT 0024 09/27/2022 BY T KENNEDY CRITICAL RESULT CALLED TO, READ BACK BY AND  VERIFIED WITH: S RICHARDSON RN 09/27/22 @ 0410 BY AB    Culture (A)  Final    STAPHYLOCOCCUS AUREUS SUSCEPTIBILITIES TO FOLLOW Performed at [facility], [address], [place], [place] [address]    Report Status PENDING  Incomplete  Blood Culture ID Panel (Reflexed)     Status: Abnormal   Collection Time: 09/26/22 11:43 AM  Result Value Ref Range Status   Enterococcus faecalis NOT DETECTED NOT DETECTED Final   Enterococcus Faecium NOT DETECTED NOT DETECTED Final   Listeria monocytogenes NOT DETECTED NOT DETECTED Final   Staphylococcus species DETECTED (A) NOT DETECTED Final    Comment: CRITICAL RESULT CALLED TO, READ BACK BY AND VERIFIED WITH: S RICHARDSON RN 09/27/22 @ 0410 BY AB    Staphylococcus aureus (BCID) DETECTED (A) NOT DETECTED Final    Comment: CRITICAL RESULT CALLED TO, READ BACK BY AND VERIFIED WITH: S RICHARDSON RN 09/27/22 @ 0410 BY AB    Staphylococcus epidermidis NOT DETECTED NOT DETECTED Final   Staphylococcus lugdunensis NOT DETECTED NOT DETECTED Final   Streptococcus species NOT DETECTED NOT DETECTED Final   Streptococcus agalactiae NOT DETECTED NOT DETECTED Final   Streptococcus pneumoniae NOT DETECTED NOT DETECTED Final   Streptococcus pyogenes NOT DETECTED NOT DETECTED Final   A.calcoaceticus-baumannii NOT DETECTED NOT DETECTED Final   Bacteroides fragilis NOT DETECTED NOT DETECTED Final   Enterobacterales NOT DETECTED NOT DETECTED Final   Enterobacter cloacae complex NOT DETECTED NOT DETECTED Final   Escherichia coli NOT DETECTED NOT DETECTED Final   Klebsiella aerogenes NOT DETECTED NOT DETECTED Final   Klebsiella oxytoca NOT DETECTED NOT DETECTED Final   Klebsiella pneumoniae NOT DETECTED NOT DETECTED Final   Proteus species NOT DETECTED NOT DETECTED Final   Salmonella species NOT DETECTED NOT DETECTED Final   Serratia marcescens NOT DETECTED NOT DETECTED Final   Haemophilus influenzae NOT DETECTED NOT DETECTED Final   Neisseria meningitidis NOT  DETECTED NOT DETECTED Final   Pseudomonas aeruginosa NOT DETECTED NOT DETECTED Final   Stenotrophomonas maltophilia NOT DETECTED NOT DETECTED Final   Candida albicans NOT DETECTED NOT DETECTED Final   Candida auris NOT DETECTED NOT DETECTED Final   Candida glabrata NOT DETECTED NOT DETECTED Final   Candida krusei NOT DETECTED NOT DETECTED Final   Candida parapsilosis NOT DETECTED NOT DETECTED Final   Candida tropicalis NOT DETECTED NOT DETECTED Final   Cryptococcus neoformans/gattii NOT DETECTED NOT DETECTED Final  Meth resistant mecA/C and MREJ NOT DETECTED NOT DETECTED Final    Comment: Performed at [facility], [address], [place], [place] [address]  Blood Culture (routine x 2)     Status: Abnormal (Preliminary result)   Collection Time: 09/26/22 11:59 AM   Specimen: BLOOD  Result Value Ref Range Status   Specimen Description   Final    BLOOD BLOOD RIGHT HAND Performed at [facility], [address], [place], [place] [address]    Special Requests   Final    Blood Culture adequate volume BOTTLES DRAWN AEROBIC AND ANAEROBIC Performed at [facility], [address] [place], [place] [address]    Culture  Setup Time   Final    GRAM POSITIVE COCCI IN CLUSTERS IN BOTH AEROBIC AND ANAEROBIC BOTTLES CRITICAL VALUE NOTED.  VALUE IS CONSISTENT WITH PREVIOUSLY REPORTED AND CALLED VALUE. Performed at [facility], [address], [place], [place] [address]    Culture STAPHYLOCOCCUS AUREUS (A)  Final   Report Status PENDING  Incomplete  Resp panel by RT-PCR (RSV, Flu A&B, Covid) Anterior Nasal Swab     Status: None   Collection Time: 09/26/22 12:12 PM   Specimen: Anterior Nasal Swab  Result Value Ref Range Status   SARS Coronavirus 2 by RT PCR NEGATIVE NEGATIVE Final    Comment: (NOTE) SARS-CoV-2 target nucleic acids are NOT DETECTED.  The SARS-CoV-2 RNA is generally detectable in upper respiratory specimens during the acute phase of infection. The lowest concentration of  SARS-CoV-2 viral copies this assay can detect is 138 copies/mL. A negative result does not preclude SARS-Cov-2 infection and should not be used as the sole basis for treatment or other patient management decisions. A negative result may occur with  improper specimen collection/handling, submission of specimen other than nasopharyngeal swab, presence of viral mutation(s) within the areas targeted by this assay, and inadequate number of viral copies(<138 copies/mL). A negative result must be combined with clinical observations, patient history, and epidemiological information. The expected result is Negative.  Fact Sheet for Patients:  BloggerCourse.com  Fact Sheet for Healthcare Providers:  SeriousBroker.it  This test is no t yet approved or cleared by the [place] FDA and  has been authorized for detection and/or diagnosis of SARS-CoV-2 by FDA under an Emergency Use Authorization (EUA). This EUA will remain  in effect (meaning this test can be used) for the duration of the COVID-19 declaration under Section 564(b)(1) of the Act, 21 U.S.C.section 360bbb-3(b)(1), unless the authorization is terminated  or revoked sooner.       Influenza A by PCR NEGATIVE NEGATIVE Final   Influenza B by PCR NEGATIVE NEGATIVE Final    Comment: (NOTE) The Xpert Xpress SARS-CoV-2/FLU/RSV plus assay is intended as an aid in the diagnosis of influenza from Nasopharyngeal swab specimens and should not be used as a sole basis for treatment. Nasal washings and aspirates are unacceptable for Xpert Xpress SARS-CoV-2/FLU/RSV testing.  Fact Sheet for Patients: BloggerCourse.com  Fact Sheet for Healthcare Providers: SeriousBroker.it  This test is not yet approved or cleared by the [place] FDA and has been authorized for detection and/or diagnosis of SARS-CoV-2 by FDA under an Emergency Use  Authorization (EUA). This EUA will remain in effect (meaning this test can be used) for the duration of the COVID-19 declaration under Section 564(b)(1) of the Act, 21 U.S.C. section 360bbb-3(b)(1), unless the authorization is terminated or revoked.     Resp Syncytial Virus by PCR NEGATIVE NEGATIVE Final  Comment: (NOTE) Fact Sheet for Patients: BloggerCourse.com  Fact Sheet for Healthcare Providers: SeriousBroker.it  This test is not yet approved or cleared by the [place] FDA and has been authorized for detection and/or diagnosis of SARS-CoV-2 by FDA under an Emergency Use Authorization (EUA). This EUA will remain in effect (meaning this test can be used) for the duration of the COVID-19 declaration under Section 564(b)(1) of the Act, 21 U.S.C. section 360bbb-3(b)(1), unless the authorization is terminated or revoked.  Performed at [facility], [address] [place], [place] [address]   Culture, blood (Routine X 2) w Reflex to ID Panel     Status: None (Preliminary result)   Collection Time: 09/27/22  9:03 AM   Specimen: BLOOD  Result Value Ref Range Status   Specimen Description BLOOD BLOOD RIGHT HAND  Final   Special Requests   Final    BOTTLES DRAWN AEROBIC AND ANAEROBIC Blood Culture adequate volume   Culture   Final    NO GROWTH < 24 HOURS Performed at [facility], [address] [place], [place] [address]    Report Status PENDING  Incomplete  Culture, blood (Routine X 2) w Reflex to ID Panel     Status: None (Preliminary result)   Collection Time: 09/27/22  9:03 AM   Specimen: BLOOD  Result Value Ref Range Status   Specimen Description BLOOD BLOOD LEFT HAND  Final   Special Requests   Final    BOTTLES DRAWN AEROBIC AND ANAEROBIC Blood Culture adequate volume   Culture   Final    NO GROWTH < 24 HOURS Performed at [name], [address] [place], [place] [address]    Report Status PENDING  Incomplete     Studies/Results: ECHOCARDIOGRAM COMPLETE  Result Date: 09/27/2022    ECHOCARDIOGRAM REPORT   Patient Name:   Savannah Burns Date of Exam: 09/27/2022 Medical Rec #:  [id number]          Height:       62.0 in Accession #:    [id number]         Weight:       280.0 lb Date of Birth:  23-Aug-1950          BSA:          2.206 m Patient Age:    72 years           BP:           104/54 mmHg Patient Gender: F                  HR:           76 bpm. [facility] Procedure: 2D Echo, Cardiac Doppler and Color Doppler Indications:    Bacteremia R78.81  History:        Patient has prior history of Echocardiogram examinations, most                 recent 06/03/2022. Sepsis; Risk Factors:Dyslipidemia, Former                 Smoker, Hypertension and Diabetes.  Sonographer:    Aron Baba Referring Phys: [id number] Chandy Tarman N VAN DAM  Sonographer Comments: Patient is obese. Image acquisition challenging due to respiratory motion. IMPRESSIONS  1. Left ventricular ejection fraction, by estimation, is 65 to 70%. The left ventricle has normal function. The left ventricle has no regional wall motion abnormalities. There is mild left ventricular hypertrophy. Left ventricular diastolic parameters  are consistent with Grade I diastolic dysfunction (impaired relaxation). Elevated left atrial pressure.  2. Right ventricular systolic function is normal. The right ventricular size is normal. There is mildly elevated pulmonary artery systolic pressure.  3. Left atrial size was mildly dilated.  4. Right atrial size was mildly dilated.  5. The mitral valve is abnormal. Mild mitral valve regurgitation.  6. The aortic valve has an indeterminant number of cusps. There is moderate calcification of the aortic valve. There is moderate thickening of the aortic valve. Aortic valve regurgitation is not visualized. Mild aortic valve stenosis. Aortic valve mean gradient measures 18.0 mmHg. Aortic valve peak gradient measures 35.3 mmHg. Aortic  valve area, by VTI measures 1.87 cm.  7. Aortic dilatation noted. There is mild dilatation of the ascending aorta, measuring 40 mm.  8. The inferior vena cava is dilated in size with >50% respiratory variability, suggesting right atrial pressure of 8 mmHg. FINDINGS  Left Ventricle: Left ventricular ejection fraction, by estimation, is 65 to 70%. The left ventricle has normal function. The left ventricle has no regional wall motion abnormalities. The left ventricular internal cavity size was normal in size. There is  mild left ventricular hypertrophy. Left ventricular diastolic parameters are consistent with Grade I diastolic dysfunction (impaired relaxation). Elevated left atrial pressure. Right Ventricle: The right ventricular size is normal. Right vetricular wall thickness was not well visualized. Right ventricular systolic function is normal. There is mildly elevated pulmonary artery systolic pressure. The tricuspid regurgitant velocity  is 2.41 m/s, and with an assumed right atrial pressure of 15 mmHg, the estimated right ventricular systolic pressure is 38.2 mmHg. Left Atrium: Left atrial size was mildly dilated. Right Atrium: Right atrial size was mildly dilated. Pericardium: There is no evidence of pericardial effusion. Mitral Valve: The mitral valve is abnormal. There is mild thickening of the mitral valve leaflet(s). There is mild calcification of the mitral valve leaflet(s). Mild mitral annular calcification. Mild mitral valve regurgitation. Tricuspid Valve: The tricuspid valve is normal in structure. Tricuspid valve regurgitation is not demonstrated. No evidence of tricuspid stenosis. Aortic Valve: The aortic valve has an indeterminant number of cusps. There is moderate calcification of the aortic valve. There is moderate thickening of the aortic valve. There is moderate aortic valve annular calcification. Aortic valve regurgitation is not visualized. Mild aortic stenosis is present. Aortic valve mean  gradient measures 18.0 mmHg. Aortic valve peak gradient measures 35.3 mmHg. Aortic valve area, by VTI measures 1.87 cm. Pulmonic Valve: The pulmonic valve was not well visualized. Pulmonic valve regurgitation is not visualized. No evidence of pulmonic stenosis. Aorta: The aortic root is normal in size and structure and aortic dilatation noted. There is mild dilatation of the ascending aorta, measuring 40 mm. Venous: The inferior vena cava is dilated in size with greater than 50% respiratory variability, suggesting right atrial pressure of 8 mmHg. IAS/Shunts: The interatrial septum was not well visualized.  LEFT VENTRICLE PLAX 2D LVIDd:         4.40 cm   Diastology LVIDs:         3.20 cm   LV e' medial:    5.70 cm/s LV PW:         1.10 cm   LV E/e' medial:  20.7 LV IVS:        1.10 cm   LV e' lateral:   9.81 cm/s LVOT diam:     2.10 cm   LV E/e' lateral: 12.0 LV SV:  108 LV SV Index:   49 LVOT Area:     3.46 cm  RIGHT VENTRICLE RV S prime:     12.90 cm/s TAPSE (M-mode): 2.3 cm LEFT ATRIUM              Index        RIGHT ATRIUM           Index LA diam:        4.30 cm  1.95 cm/m   RA Area:     21.30 cm LA Vol (A2C):   65.1 ml  29.52 ml/m  RA Volume:   68.50 ml  31.06 ml/m LA Vol (A4C):   103.0 ml 46.70 ml/m LA Biplane Vol: 89.5 ml  40.58 ml/m  AORTIC VALVE AV Area (Vmax):    1.77 cm AV Area (Vmean):   1.90 cm AV Area (VTI):     1.87 cm AV Vmax:           297.00 cm/s AV Vmean:          191.333 cm/s AV VTI:            0.575 m AV Peak Grad:      35.3 mmHg AV Mean Grad:      18.0 mmHg LVOT Vmax:         152.00 cm/s LVOT Vmean:        105.000 cm/s LVOT VTI:          0.311 m LVOT/AV VTI ratio: 0.54  AORTA Ao Root diam: 3.60 cm Ao Asc diam:  4.00 cm MITRAL VALVE                TRICUSPID VALVE MV Area (PHT): 2.21 cm     TR Peak grad:   23.2 mmHg MV Decel Time: 343 msec     TR Vmax:        241.00 cm/s MV E velocity: 118.00 cm/s MV A velocity: 156.00 cm/s  SHUNTS MV E/A ratio:  0.76         Systemic VTI:  0.31  m                             Systemic Diam: 2.10 cm Dina Rich MD Electronically signed by Dina Rich MD Signature Date/Time: 09/27/2022/12:47:19 PM    Final    CT CHEST W CONTRAST  Result Date: 09/26/2022 CLINICAL DATA:  Soft tissue infection involving the right axilla and right flank EXAM: CT CHEST WITH CONTRAST TECHNIQUE: Multidetector CT imaging of the chest was performed during intravenous contrast administration. RADIATION DOSE REDUCTION: This exam was performed according to the departmental dose-optimization program which includes automated exposure control, adjustment of the mA and/or kV according to patient size and/or use of iterative reconstruction technique. CONTRAST:  75mL OMNIPAQUE IOHEXOL 300 MG/ML  SOLN COMPARISON:  PET-CT dated 11/18/2021 FINDINGS: Cardiovascular: The heart is normal in size. No pericardial effusion. No evidence of thoracic aortic aneurysm. Atherosclerotic calcifications of the aortic arch. Moderate three-vessel coronary atherosclerosis. Right chest port terminates at the cavoatrial junction. Mediastinum/Nodes: No suspicious mediastinal lymphadenopathy. Visualized thyroid is unremarkable. Lungs/Pleura: Evaluation lung parenchyma is constrained by respiratory motion. Within that constraint, there are patchy/ground-glass opacities in the lateral upper lobes (series 5/image 50), posterior right upper lobe along the major fissure (series 5/image 54) and anterior might middle lobe adjacent to the minor fissure (series 5/image 69) which are [place]annah from the prior and favor mild infection/pneumonia. Mild subpleural scarring/atelectasis  in the lingula, chronic. Mild dependent atelectasis in the bilateral lower lobes. Numerous hyperdense foci in the posterior lower lobes, favored sequela of prior aspiration. No pleural effusion or pneumothorax. Upper Abdomen: Visualized upper abdomen is notable for a nodular hepatic border, suggesting cirrhosis. Stable 3.2 cm left adrenal nodule,  without hypermetabolism on PET, favoring a benign adrenal adenoma. Musculoskeletal: Cutaneous thickening along the right lateral chest wall ((series 2/image 81) with mild subcutaneous extending to the right axilla, where there is focal subcutaneous stranding/inflammation (series 2/image 44), suggesting cellulitis. However, there is no drainable fluid collection/abscess. Degenerative changes of the visualized thoracolumbar spine. No focal lytic lesions in this patient with history of multiple myeloma. IMPRESSION: Suspected cellulitis along the right axilla/lateral chest wall. No drainable fluid collection/abscess. Scattered mild patchy/ground-glass opacities in the lungs bilaterally, as described above, [place]annah from prior PET. Although poorly evaluated due to motion degradation, this appearance favors mild infection/pneumonia. Additional stable ancillary findings as above. Aortic Atherosclerosis (ICD10-I70.0). Electronically Signed   By: Charline Bills M.D.   On: 09/26/2022 22:00      Assessment/Plan:  INTERVAL HISTORY: port removed and patient had surgery to axillary area   Active Problems:   Multiple myeloma (HCC)   Obesity, Class III, BMI 40-49.9 (morbid obesity) (HCC)   Chronic respiratory failure with hypoxia (HCC)   Cellulitis of axilla, right    Savannah Burns is a 72 y.o. female with  multiple myeloma right axillary boils who failed oral therapy and in fact became bacteremic with MSSA. She has port in place that was removed today  #1 MSSA bacteremia  She is sp port removal and I and D of right axillary wound  Unfortunately no one has been able to get IV access and I would like to give her ancef while she has a LINE [employer]  In the interim I will switch her to zyvox and watch her platlets which are already low but in 90s  I have ordered repeat blood cultures  TTE did not show endocarditis but I would not pursue TEE given her COPD, and chronic respiratory failure  I would favor  at minimum 4 week course of IV antibiotics    I discussed the assessment and treatment plan with the patient. The patient was provided an opportunity to ask questions and all were answered. The patient agreed with the plan and demonstrated an understanding of the instructions.   I have personally spent 50 minutes involved in face-to-face and non-face-to-face activities for this patient on the day of the visit. Professional time spent includes the following activities: Preparing to see the patient (review of tests), Obtaining and/or reviewing separately obtained history (admission/discharge record), Performing a medically appropriate examination and/or evaluation , Ordering medications/tests/procedures, referring and communicating with other health care professionals, Documenting clinical information in the EMR, Independently interpreting results (not separately reported), Communicating results to the patient/family/caregiver, Counseling and educating the patient/family/caregiver and Care coordination (not separately reported).   I will be available over the 4th of July weekend for questions  and check on how the patient is doing but not be able to do tele health visit until Monday at the earliest.      LOS: 2 days   Acey Lav 09/28/2022, 4:46 PM       Acey Lav, MD

## 2022-09-28 NOTE — Progress Notes (Signed)
[facility]  Spoke with the patient at the completion of the procedure.  Explained that we are able to remove the port without difficulty.  I will check this incision site tomorrow to verify there is no evidence of infection.  I further explained that there was no evidence of abscess in her right axilla.  I did pack the area, and will evaluate the wound tomorrow.  All questions were answered to her expressed satisfaction.  Theophilus Kinds, DO [facility] [address] [place], [place] [address] [phone] (office)

## 2022-09-28 NOTE — Progress Notes (Signed)
  Progress Note   Patient: Savannah Burns ZOX:[id number] DOB: 1950/07/20 DOA: 09/26/2022     2 DOS: the patient was seen and examined on 09/28/2022   Brief hospital course: Savannah Burns was admitted to the hospital with the working diagnosis of skin abscess complicated with MSSA bacteremia complicated with sepsis present on admission.   72 yo female with the past medical history of NASH cirrhosis, T2DM, hypertension, dyslipidemia, COPD, multiple myeloma, and depression who presented with cellulitis and abscess right flank and axilla. She was diagnosed with skin infection about 2 weeks prior to admission and was treated with Bactrim. Her symptoms did not improved and antibiotic was changed to cephalexin, and because of nausea and vomiting was further changed to doxycyline. Unfortunately her symptoms continue to worsen, developing fever and generalized weakness prompting her to come to the ED.  On her initial physical examination her temp was 100,6, and she was tachycardic.   Assessment and Plan: Cellulitis of axilla, right Axillary abscess.  MSSA bacteremia.  Clinically improving, has drain in place after I & D.  Port has been removed today.  Echocardiogram with no vegetations,   Plan to continue antibiotic therapy with cefazolin. Follow up on repeat cultures. Follow up on cell count and temperature curve.  Follow up with ID recommendations.   Chronic respiratory failure with hypoxia (HCC) No signs of exacerbation, continue supplemental 02 per [place]   Multiple myeloma (HCC) Follow up as outpatient. Chemotherapy on hold for now.   Obesity, Class III, BMI 40-49.9 (morbid obesity) (HCC) Calculated BMI is 51.1         Subjective: Patient with no dyspnea, positive pain at her right axillary region but controlled with analgesics.  Physical Exam: Vitals:   09/28/22 0028 09/28/22 0244 09/28/22 0616 09/28/22 1034  BP:  126/65 122/63 (!) 140/63  Pulse:  73 78 77  Resp:  19 18   Temp:  99.8 F (37.7 C) 98.2 F (36.8 C) 98.2 F (36.8 C) 98 F (36.7 C)  TempSrc: Oral   Oral  SpO2:  100% 98% 97%  Weight:      Height:       Neurology awake and alert ENT with mild pallor Cardiovascular with S1 and S2 present and rhythmic, no gallops, positive systolic murmur at the right lower sternal border Respiratory with no rales or wheezing Abdomen with no distention  No lower extremity edema Right axillary with open wound and dressing in place. Mild local erythema and edema  Data Reviewed:    Family Communication: I spoke with patient's family at the bedside, we talked in detail about patient's condition, plan of care and prognosis and all questions were addressed.   Disposition: Status is: Inpatient Remains inpatient appropriate because: IV antibiotic therapy   Planned Discharge Destination: Home     Author: Coralie Keens, MD 09/28/2022 1:35 PM  For on call review [email or web address].

## 2022-09-28 NOTE — Consult Note (Signed)
[facility] Consult  Reason for Consult: MSSA bacteremia requiring Port-A-Cath removal, evaluation of possible right axillary abscess Referring Physician: Dr. Arbutus Leas  Chief Complaint   Abscess     HPI: JADIN GARBISCH is a 72 y.o. female who was admitted with weakness and difficulty walking.  She was noted to have an MSSA bacteremia.  She has a right IJ Port-A-Cath in place, which was placed by IR in September 2023.  She has multiple myeloma for which she receives injections, and she also receives iron infusions through her port.  She also states that starting a week ago, she had increasing swelling with an abscess that popped in her right axilla.  She has a history of axillary boils that will intermittently drain purulence.  Her past medical history significant for diabetes, hypertension, hyperlipidemia, COPD on 3 L nasal cannula, and multiple myeloma.  Her surgical history significant for right breast mass excision, right IJ Port-A-Cath insertion, and C-section.  She denies use of blood thinning medications.  She underwent a CT of the chest on 7/1 which demonstrated cellulitis along the right axilla/lateral chest without drainable fluid collection or abscess.  She has no leukocytosis.  Her vitals have been stable excluding a fever of 102.2 yesterday afternoon.  Past Medical History:  Diagnosis Date   Adrenal adenoma, left    Stable   Anxiety    Arthritis    bilateral hands   Depression    Diabetes mellitus, type 2 (HCC) 08/12/2008   Qualifier: Diagnosis of  By: Dayton Martes MD, Talia     Dyspnea    Esophageal varices (HCC)    Grade II diastolic dysfunction    History of kidney stones    Hyperlipidemia    Hypertension    Lower back pain    Lower GI bleed 03/19/2020   Panic attacks    Pneumonia    currently taking antibiotic and prednisone for early stages of pneumonia   Pulmonary nodules    bilateral   Skin cancer    face    Past Surgical History:  Procedure  Laterality Date   BIOPSY  04/07/2020   Procedure: BIOPSY;  Surgeon: Lanelle Bal, DO;  [facility];  Service: Endoscopy;;   Breast Cystectomy  Right    CESAREAN SECTION     COLONOSCOPY WITH PROPOFOL N/A 01/25/2020   Dr. Marletta Lor: Nonbleeding internal hemorrhoids, diverticulosis, 5 mm polyp removed from the ascending colon, 10 mm polyp removed from the sigmoid colon, 30 mm polyp (tubulovillous adenoma with no high-grade dysplasia) removed from the transverse colon via piecemeal status post tattoo.  Other polyps were tubular adenomas.  3 month surveillance colonoscopy recommended.   COLONOSCOPY WITH PROPOFOL N/A 04/07/2020   Procedure: COLONOSCOPY WITH PROPOFOL;  Surgeon: Lanelle Bal, DO;  [facility];  Service: Endoscopy;  Laterality: N/A;  3:00pm, pt knows new time per office   CYSTOSCOPY/URETEROSCOPY/HOLMIUM LASER/STENT PLACEMENT Bilateral 03/01/2019   Procedure: CYSTOSCOPY/RETROGRADEURETEROSCOPY/HOLMIUM LASER/STENT PLACEMENT;  Surgeon: Rene Paci, MD;  Location: [facility];  Service: Urology;  Laterality: Bilateral;  ONLY NEEDS 60 MIN   ESOPHAGOGASTRODUODENOSCOPY (EGD) WITH PROPOFOL N/A 01/25/2020   Dr. Marletta Lor: 4 columns grade 1 esophageal varices   ESOPHAGOGASTRODUODENOSCOPY (EGD) WITH PROPOFOL N/A 05/18/2020   Procedure: ESOPHAGOGASTRODUODENOSCOPY (EGD) WITH PROPOFOL;  Surgeon: Lanelle Bal, DO;  [facility];  Service: Endoscopy;  Laterality: N/A;   ESOPHAGOGASTRODUODENOSCOPY (EGD) WITH PROPOFOL N/A 07/28/2020   Procedure: ESOPHAGOGASTRODUODENOSCOPY (EGD) WITH PROPOFOL;  Surgeon: Lanelle Bal, DO;  [facility];  Service: Endoscopy;  Laterality: N/A;  am or early PM due to givens capsule placement   GIVENS CAPSULE STUDY N/A 05/18/2020   Procedure: GIVENS CAPSULE STUDY;  Surgeon: Dolores Frame, MD;  [facility];  Service: Gastroenterology;  Laterality: N/A;   GIVENS CAPSULE STUDY N/A 07/28/2020    Procedure: GIVENS CAPSULE STUDY;  Surgeon: Lanelle Bal, DO;  [facility];  Service: Endoscopy;  Laterality: N/A;   IR IMAGING GUIDED PORT INSERTION  12/02/2021   POLYPECTOMY  01/25/2020   Procedure: POLYPECTOMY;  Surgeon: Lanelle Bal, DO;  [facility];  Service: Endoscopy;;   POLYPECTOMY  04/07/2020   Procedure: POLYPECTOMY INTESTINAL;  Surgeon: Lanelle Bal, DO;  [facility];  Service: Endoscopy;;   SKIN CANCER EXCISION     Face   SPINE SURGERY     SUBMUCOSAL TATTOO INJECTION  01/25/2020   Procedure: SUBMUCOSAL TATTOO INJECTION;  Surgeon: Lanelle Bal, DO;  [facility];  Service: Endoscopy;;    Family History  Problem Relation Age of Onset   Diabetes Father    Heart disease Father 42       MI   Hypertension Father    Anemia Mother        Transfusion dependent   COPD Sister    Cancer Paternal Grandmother 38       Pancreatic    Social History   Tobacco Use   Smoking status: Former    Packs/day: 1.50    Years: 40.00    Additional pack years: 0.00    Total pack years: 60.00    Types: Cigarettes    Quit date: 04/29/2015    Years since quitting: 7.4   Smokeless tobacco: Never   Tobacco comments:    Quit smoking 04/2015- Previous 1.5 ppd smoker  Vaping Use   Vaping Use: Never used  Substance Use Topics   Alcohol use: No    Alcohol/week: 0.0 standard drinks of alcohol   Drug use: No    Medications: I have reviewed the patient's current medications. Prior to Admission:  Medications Prior to Admission  Medication Sig Dispense Refill Last Dose   baclofen (LIORESAL) 10 MG tablet Take 10 mg by mouth 2 (two) times daily.   09/25/2022   carvedilol (COREG) 6.25 MG tablet Take 6.25 mg by mouth 2 (two) times daily.   09/25/2022 at [facility]   Cholecalciferol (VITAMIN D) 50 MCG (2000 UT) tablet Take 2,000 Units by mouth daily.   09/26/2022   clobetasol (TEMOVATE) 0.05 % external solution Apply 1 Application topically 2 (two)  times daily.   UNK   dexamethasone (DECADRON) 4 MG tablet Take 5 tablets (20 mg total) by mouth once a week. 20 tablet 3 Past Week   [EXPIRED] doxycycline (VIBRA-TABS) 100 MG tablet Take 1 tablet (100 mg total) by mouth 2 (two) times daily for 7 days. (Patient not taking: Reported on 09/27/2022) 14 tablet 0 Completed Course   esomeprazole (NEXIUM) 20 MG capsule Take 1 capsule (20 mg total) by mouth daily at 12 noon. (Patient taking differently: Take 20 mg by mouth daily as needed (reflux).) 90 capsule 3 UNK   ferrous sulfate 325 (65 FE) MG tablet Take 1 tablet (325 mg total) by mouth daily with breakfast. 30 tablet 3 09/25/2022   lenalidomide (REVLIMID) 15 MG capsule TAKE 1 CAPSULE BY MOUTH EVERY DAY FOR 21 DAYS ON THEN 7 DAYS OFF (Patient taking differently: Take 15  mg by mouth See admin instructions. TAKE 1 CAPSULE BY MOUTH EVERY DAY FOR 21 DAYS ON THEN 7 DAYS OFF) 21 capsule 0 Past Week   lidocaine-prilocaine (EMLA) cream Apply 1 Application topically as needed. Apply a small amount to port a cath site and cover with plastic wrap 1 hour prior to infusion appointments 30 g 3 Past Week   oxyCODONE-acetaminophen (PERCOCET) 10-325 MG tablet Take 1 tablet by mouth every 6 (six) hours as needed for pain.   Past Week   OXYGEN Inhale 3 L into the lungs continuous.   Past Week   OZEMPIC, 0.25 OR 0.5 MG/DOSE, 2 MG/3ML SOPN Inject 2 mg into the skin once a week.   Past Month   acyclovir (ZOVIRAX) 400 MG tablet Take 1 tablet (400 mg total) by mouth 2 (two) times daily. (Patient not taking: Reported on 09/27/2022) 60 tablet 6 Not Taking   Alpha-Lipoic Acid 600 MG CAPS Take 1 capsule (600 mg total) by mouth daily. For neuropathy (Patient not taking: Reported on 09/27/2022) 100 capsule 3 Not Taking   bacitracin ointment Apply 1 Application topically 2 (two) times daily. (Patient not taking: Reported on 09/27/2022) 120 g 0 Not Taking   Blood Glucose Calibration (TRUE METRIX LEVEL 1) Low SOLN Use w/ glucose monitor Dx E11.9 3  each 0    Blood Glucose Monitoring Suppl (TRUE METRIX AIR GLUCOSE METER) w/Device KIT Check BS daily Dx E11.9 1 kit 0    Cyanocobalamin (B-12 COMPLIANCE INJECTION) 1000 MCG/ML KIT Inject 1,000 mcg as directed every 30 (thirty) days.      desvenlafaxine (PRISTIQ) 100 MG 24 hr tablet Take 1 tablet (100 mg total) by mouth daily. (Patient not taking: Reported on 09/27/2022) 90 tablet 3 Not Taking   furosemide (LASIX) 20 MG tablet Take 20 mg by mouth. bid (Patient not taking: Reported on 09/27/2022)   Not Taking   glucose blood (TRUE METRIX BLOOD GLUCOSE TEST) test strip Check BS daily Dx E11.9 100 each 3    lovastatin (MEVACOR) 20 MG tablet Take 1 tablet (20 mg total) by mouth at bedtime. (Patient not taking: Reported on 09/27/2022) 90 tablet 3 Not Taking   omeprazole (PRILOSEC) 20 MG capsule Take 20 mg by mouth 2 (two) times daily. (Patient not taking: Reported on 09/27/2022)   Not Taking   potassium chloride SA (KLOR-CON) 20 MEQ tablet Take 2 tablets (40 mEq total) by mouth 2 (two) times daily. (Patient taking differently: Take 40 mEq by mouth daily.) 360 tablet 3    prochlorperazine (COMPAZINE) 10 MG tablet Take 1 tablet (10 mg total) by mouth every 6 (six) hours as needed for nausea or vomiting. (Patient not taking: Reported on 09/27/2022) 30 tablet 6 Completed Course   torsemide (DEMADEX) 20 MG tablet Take 3 tablets (60 mg total) by mouth 2 (two) times daily. (Patient taking differently: Take 50 mg by mouth 2 (two) times daily.) 180 tablet 11    TRUEplus Lancets 33G MISC Check BS daily Dx E11.9 100 each 3    Scheduled:  [MAR Hold] acyclovir  400 mg Oral BID   [MAR Hold] Chlorhexidine Gluconate Cloth  6 each Topical Daily   [MAR Hold] insulin aspart  0-9 Units Subcutaneous TID WC   [MAR Hold] pantoprazole  40 mg Oral Daily   [MAR Hold] pravastatin  20 mg Oral q1800   [MAR Hold] venlafaxine XR  150 mg Oral Q breakfast   Continuous:  [MAR Hold]  ceFAZolin (ANCEF) IV 2 g (09/28/22 6578)  PRN:[MAR Hold]  acetaminophen **OR** [MAR Hold] acetaminophen, [MAR Hold] ondansetron **OR** [MAR Hold] ondansetron (ZOFRAN) IV, [MAR Hold] oxyCODONE-acetaminophen **AND** [MAR Hold] oxyCODONE  Allergies  Allergen Reactions   Keflex [Cephalexin] Nausea And Vomiting     ROS:  Pertinent items are noted in HPI.  Blood pressure (!) 140/63, pulse 77, temperature 98 F (36.7 C), temperature source Oral, resp. rate 18, height 5\' 2"  (1.575 m), weight 127 kg, SpO2 97 %. Physical Exam Vitals reviewed.  Constitutional:      Appearance: Normal appearance.  HENT:     Head: Normocephalic and atraumatic.  Eyes:     Extraocular Movements: Extraocular movements intact.     Pupils: Pupils are equal, round, and reactive to light.  Cardiovascular:     Rate and Rhythm: Normal rate.  Pulmonary:     Effort: Pulmonary effort is normal.  Chest:     Comments: Right IJ Port-A-Cath in place without erythema or induration Abdominal:     General: There is no distension.     Palpations: Abdomen is soft.     Tenderness: There is no abdominal tenderness.  Musculoskeletal:        General: Normal range of motion.     Cervical back: Normal range of motion.  Skin:    Comments: Right axilla with improving erythema, nodular area with underlying induration and overlying fibrinous exudate, no palpable fluctuance; ecchymotic area on right lateral back which is mildly tender to palpation but no significant induration or fluctuance to suggest abscess  Neurological:     General: No focal deficit present.     Mental Status: She is alert and oriented to person, place, and time.  Psychiatric:        Mood and Affect: Mood normal.        Behavior: Behavior normal.     Results: Results for orders placed or performed during the hospital encounter of 09/26/22 (from the past 48 hour(s))  Blood Culture (routine x 2)     Status: Abnormal (Preliminary result)   Collection Time: 09/26/22 11:43 AM   Specimen: BLOOD  Result Value Ref Range    Specimen Description      BLOOD BLOOD LEFT ARM Performed at [facility], [address], [place], [place] [address]    Special Requests      BOTTLES DRAWN AEROBIC AND ANAEROBIC Blood Culture adequate volume Performed at [facility], [address], [place], [place] [address]    Culture  Setup Time      GRAM POSITIVE COCCI IN CLUSTERS IN BOTH AEROBIC AND ANAEROBIC BOTTLES CRITICAL RESULT CALLED TO, READ BACK BY AND VERIFIED WITH: S RICHARDSON AT 0024 09/27/2022 BY T KENNEDY CRITICAL RESULT CALLED TO, READ BACK BY AND VERIFIED WITH: S RICHARDSON RN 09/27/22 @ 0410 BY AB    Culture (A)     STAPHYLOCOCCUS AUREUS SUSCEPTIBILITIES TO FOLLOW Performed at [facility], [address], [place], [place] [address]    Report Status PENDING   Blood Culture ID Panel (Reflexed)     Status: Abnormal   Collection Time: 09/26/22 11:43 AM  Result Value Ref Range   Enterococcus faecalis NOT DETECTED NOT DETECTED   Enterococcus Faecium NOT DETECTED NOT DETECTED   Listeria monocytogenes NOT DETECTED NOT DETECTED   Staphylococcus species DETECTED (A) NOT DETECTED    Comment: CRITICAL RESULT CALLED TO, READ BACK BY AND VERIFIED WITH: S RICHARDSON RN 09/27/22 @ 0410 BY AB    Staphylococcus aureus (BCID) DETECTED (A) NOT DETECTED  Comment: CRITICAL RESULT CALLED TO, READ BACK BY AND VERIFIED WITH: S RICHARDSON RN 09/27/22 @ 0410 BY AB    Staphylococcus epidermidis NOT DETECTED NOT DETECTED   Staphylococcus lugdunensis NOT DETECTED NOT DETECTED   Streptococcus species NOT DETECTED NOT DETECTED   Streptococcus agalactiae NOT DETECTED NOT DETECTED   Streptococcus pneumoniae NOT DETECTED NOT DETECTED   Streptococcus pyogenes NOT DETECTED NOT DETECTED   A.calcoaceticus-baumannii NOT DETECTED NOT DETECTED   Bacteroides fragilis NOT DETECTED NOT DETECTED   Enterobacterales NOT DETECTED NOT DETECTED   Enterobacter cloacae complex NOT DETECTED NOT DETECTED   Escherichia coli NOT DETECTED NOT DETECTED    Klebsiella aerogenes NOT DETECTED NOT DETECTED   Klebsiella oxytoca NOT DETECTED NOT DETECTED   Klebsiella pneumoniae NOT DETECTED NOT DETECTED   Proteus species NOT DETECTED NOT DETECTED   Salmonella species NOT DETECTED NOT DETECTED   Serratia marcescens NOT DETECTED NOT DETECTED   Haemophilus influenzae NOT DETECTED NOT DETECTED   Neisseria meningitidis NOT DETECTED NOT DETECTED   Pseudomonas aeruginosa NOT DETECTED NOT DETECTED   Stenotrophomonas maltophilia NOT DETECTED NOT DETECTED   Candida albicans NOT DETECTED NOT DETECTED   Candida auris NOT DETECTED NOT DETECTED   Candida glabrata NOT DETECTED NOT DETECTED   Candida krusei NOT DETECTED NOT DETECTED   Candida parapsilosis NOT DETECTED NOT DETECTED   Candida tropicalis NOT DETECTED NOT DETECTED   Cryptococcus neoformans/gattii NOT DETECTED NOT DETECTED   Meth resistant mecA/C and MREJ NOT DETECTED NOT DETECTED    Comment: Performed at [facility], [address], [place], [place] [address]  Lactic acid, plasma     Status: Abnormal   Collection Time: 09/26/22 11:59 AM  Result Value Ref Range   Lactic Acid, Venous 2.0 (HH) 0.5 - 1.9 mmol/L    Comment: CRITICAL RESULT CALLED TO, READ BACK BY AND VERIFIED WITH MOORE,A  AT 12:20PM ON 09/26/22 BY [facility] Performed at [facility], [address] [place], [place] [address]   Comprehensive metabolic panel     Status: Abnormal   Collection Time: 09/26/22 11:59 AM  Result Value Ref Range   Sodium 133 (L) 135 - 145 mmol/L   Potassium 4.1 3.5 - 5.1 mmol/L   Chloride 99 98 - 111 mmol/L   CO2 26 22 - 32 mmol/L   Glucose, Bld 177 (H) 70 - 99 mg/dL    Comment: Glucose reference range applies only to samples taken after fasting for at least 8 hours.   BUN 11 8 - 23 mg/dL   Creatinine, Ser 0.98 0.44 - 1.00 mg/dL   Calcium 8.1 (L) 8.9 - 10.3 mg/dL   Total Protein 5.8 (L) 6.5 - 8.1 g/dL   Albumin 3.1 (L) 3.5 - 5.0 g/dL   AST 22 15 - 41 U/L   ALT 17 0 - 44 U/L   Alkaline  Phosphatase 91 38 - 126 U/L   Total Bilirubin 0.8 0.3 - 1.2 mg/dL   GFR, Estimated >11 >91 mL/min    Comment: (NOTE) Calculated using the CKD-EPI Creatinine Equation (2021)    Anion gap 8 5 - 15    Comment: Performed at [facility], [address], [place], [place] [address]  CBC with Differential     Status: Abnormal   Collection Time: 09/26/22 11:59 AM  Result Value Ref Range   WBC 4.9 4.0 - 10.5 K/uL   RBC 3.25 (L) 3.87 - 5.11 MIL/uL   Hemoglobin 10.2 (L) 12.0 - 15.0 g/dL   HCT 56.2 (L) 13.0 -  46.0 %   MCV 98.8 80.0 - 100.0 fL   MCH 31.4 26.0 - 34.0 pg   MCHC 31.8 30.0 - 36.0 g/dL   RDW 53.6 (H) 64.4 - 03.4 %   Platelets 89 (L) 150 - 400 K/uL    Comment: SPECIMEN CHECKED FOR CLOTS Immature Platelet Fraction may be clinically indicated, consider ordering this additional test [id number] REPEATED TO VERIFY    nRBC 0.0 0.0 - 0.2 %   Neutrophils Relative % 80 %   Neutro Abs 4.0 1.7 - 7.7 K/uL   Lymphocytes Relative 7 %   Lymphs Abs 0.3 (L) 0.7 - 4.0 K/uL   Monocytes Relative 12 %   Monocytes Absolute 0.6 0.1 - 1.0 K/uL   Eosinophils Relative 0 %   Eosinophils Absolute 0.0 0.0 - 0.5 K/uL   Basophils Relative 0 %   Basophils Absolute 0.0 0.0 - 0.1 K/uL   WBC Morphology TOXIC GRANULATION    Smear Review PLATELET COUNT CONFIRMED BY SMEAR    Immature Granulocytes 1 %   Abs Immature Granulocytes 0.05 0.00 - 0.07 K/uL   Ovalocytes PRESENT     Comment: Performed at [facility], [address] [place], [place] [address]  Protime-INR     Status: None   Collection Time: 09/26/22 11:59 AM  Result Value Ref Range   Prothrombin Time 14.6 11.4 - 15.2 seconds   INR 1.1 0.8 - 1.2    Comment: (NOTE) INR goal varies based on device and disease states. Performed at [facility], [address] [place], [place] [address]   APTT     Status: None   Collection Time: 09/26/22 11:59 AM  Result Value Ref Range   aPTT 27 24 - 36 seconds    Comment: Performed at [facility], [address] [place], [place] [address]  Blood Culture (routine x 2)     Status: Abnormal (Preliminary result)   Collection Time: 09/26/22 11:59 AM   Specimen: BLOOD  Result Value Ref Range   Specimen Description      BLOOD BLOOD RIGHT HAND Performed at [facility], [address] [place], [place] [address]    Special Requests      Blood Culture adequate volume BOTTLES DRAWN AEROBIC AND ANAEROBIC Performed at [facility], [address], [place], [place] [address]    Culture  Setup Time      GRAM POSITIVE COCCI IN CLUSTERS IN BOTH AEROBIC AND ANAEROBIC BOTTLES CRITICAL VALUE NOTED.  VALUE IS CONSISTENT WITH PREVIOUSLY REPORTED AND CALLED VALUE. Performed at [facility], [address], [place], [place] [address]    Culture STAPHYLOCOCCUS AUREUS (A)    Report Status PENDING   Hemoglobin A1c     Status: Abnormal   Collection Time: 09/26/22 11:59 AM  Result Value Ref Range   Hgb A1c MFr Bld 4.7 (L) 4.8 - 5.6 %    Comment: (NOTE) Pre diabetes:          5.7%-6.4%  Diabetes:              >6.4%  Glycemic control for   <7.0% adults with diabetes    Mean Plasma Glucose 88.19 mg/dL    Comment: Performed at [facility], [address] [place], [place] [address]  Procalcitonin     Status: None   Collection Time: 09/26/22 11:59 AM  Result Value Ref Range   Procalcitonin <0.10 ng/mL    Comment:        Interpretation: PCT (Procalcitonin) <= 0.5  ng/mL: Systemic infection (sepsis) is not likely. Local bacterial infection is possible. (NOTE)       Sepsis PCT Algorithm           Lower Respiratory Tract                                      Infection PCT Algorithm    ----------------------------     ----------------------------         PCT < 0.25 ng/mL                PCT < 0.10 ng/mL          Strongly encourage             Strongly discourage   discontinuation of antibiotics    initiation of antibiotics    ----------------------------     -----------------------------       PCT 0.25 - 0.50  ng/mL            PCT 0.10 - 0.25 ng/mL               OR       >80% decrease in PCT            Discourage initiation of                                            antibiotics      Encourage discontinuation           of antibiotics    ----------------------------     -----------------------------         PCT >= 0.50 ng/mL              PCT 0.26 - 0.50 ng/mL               AND        <80% decrease in PCT             Encourage initiation of                                             antibiotics       Encourage continuation           of antibiotics    ----------------------------     -----------------------------        PCT >= 0.50 ng/mL                  PCT > 0.50 ng/mL               AND         increase in PCT                  Strongly encourage                                      initiation of antibiotics    Strongly encourage escalation           of antibiotics                                     -----------------------------  PCT <= 0.25 ng/mL                                                 OR                                        > 80% decrease in PCT                                      Discontinue / Do not initiate                                             antibiotics  Performed at [facility], [address] [place], [place] [address]   Brain natriuretic peptide     Status: None   Collection Time: 09/26/22 11:59 AM  Result Value Ref Range   B Natriuretic Peptide 99.0 0.0 - 100.0 pg/mL    Comment: Performed at [facility], [address], [place], [place] [address]  Resp panel by RT-PCR (RSV, Flu A&B, Covid) Anterior Nasal Swab     Status: None   Collection Time: 09/26/22 12:12 PM   Specimen: Anterior Nasal Swab  Result Value Ref Range   SARS Coronavirus 2 by RT PCR NEGATIVE NEGATIVE    Comment: (NOTE) SARS-CoV-2 target nucleic acids are NOT DETECTED.  The SARS-CoV-2 RNA is generally detectable in upper respiratory specimens  during the acute phase of infection. The lowest concentration of SARS-CoV-2 viral copies this assay can detect is 138 copies/mL. A negative result does not preclude SARS-Cov-2 infection and should not be used as the sole basis for treatment or other patient management decisions. A negative result may occur with  improper specimen collection/handling, submission of specimen other than nasopharyngeal swab, presence of viral mutation(s) within the areas targeted by this assay, and inadequate number of viral copies(<138 copies/mL). A negative result must be combined with clinical observations, patient history, and epidemiological information. The expected result is Negative.  Fact Sheet for Patients:  BloggerCourse.com  Fact Sheet for Healthcare Providers:  SeriousBroker.it  This test is no t yet approved or cleared by the [place] FDA and  has been authorized for detection and/or diagnosis of SARS-CoV-2 by FDA under an Emergency Use Authorization (EUA). This EUA will remain  in effect (meaning this test can be used) for the duration of the COVID-19 declaration under Section 564(b)(1) of the Act, 21 U.S.C.section 360bbb-3(b)(1), unless the authorization is terminated  or revoked sooner.       Influenza A by PCR NEGATIVE NEGATIVE   Influenza B by PCR NEGATIVE NEGATIVE    Comment: (NOTE) The Xpert Xpress SARS-CoV-2/FLU/RSV plus assay is intended as an aid in the diagnosis of influenza from Nasopharyngeal swab specimens and should not be used as a sole basis for treatment. Nasal washings and aspirates are unacceptable for Xpert Xpress SARS-CoV-2/FLU/RSV testing.  Fact Sheet for Patients: BloggerCourse.com  Fact Sheet for Healthcare Providers: SeriousBroker.it  This test is not yet approved or cleared by the [place] FDA and has been authorized for detection and/or  diagnosis  of SARS-CoV-2 by FDA under an Emergency Use Authorization (EUA). This EUA will remain in effect (meaning this test can be used) for the duration of the COVID-19 declaration under Section 564(b)(1) of the Act, 21 U.S.C. section 360bbb-3(b)(1), unless the authorization is terminated or revoked.     Resp Syncytial Virus by PCR NEGATIVE NEGATIVE    Comment: (NOTE) Fact Sheet for Patients: BloggerCourse.com  Fact Sheet for Healthcare Providers: SeriousBroker.it  This test is not yet approved or cleared by the [place] FDA and has been authorized for detection and/or diagnosis of SARS-CoV-2 by FDA under an Emergency Use Authorization (EUA). This EUA will remain in effect (meaning this test can be used) for the duration of the COVID-19 declaration under Section 564(b)(1) of the Act, 21 U.S.C. section 360bbb-3(b)(1), unless the authorization is terminated or revoked.  Performed at [facility], [address] [place], [place] [address]   Lactic acid, plasma     Status: None   Collection Time: 09/26/22  2:41 PM  Result Value Ref Range   Lactic Acid, Venous 1.2 0.5 - 1.9 mmol/L    Comment: Performed at [facility], [address] [place], [place] [address]  Glucose, capillary     Status: Abnormal   Collection Time: 09/26/22  5:29 PM  Result Value Ref Range   Glucose-Capillary 137 (H) 70 - 99 mg/dL    Comment: Glucose reference range applies only to samples taken after fasting for at least 8 hours.   Comment 1 Notify RN    Comment 2 Document in Chart   Urinalysis, w/ Reflex to Culture (Infection Suspected) -Urine, Clean Catch     Status: Abnormal   Collection Time: 09/26/22  6:00 PM  Result Value Ref Range   Specimen Source URINE, CLEAN CATCH    Color, Urine STRAW (A) YELLOW   APPearance CLEAR CLEAR   Specific Gravity, Urine 1.005 1.005 - 1.030   pH 6.0 5.0 - 8.0   Glucose, UA NEGATIVE NEGATIVE mg/dL   Hgb urine dipstick SMALL  (A) NEGATIVE   Bilirubin Urine NEGATIVE NEGATIVE   Ketones, ur NEGATIVE NEGATIVE mg/dL   Protein, ur NEGATIVE NEGATIVE mg/dL   Nitrite NEGATIVE NEGATIVE   Leukocytes,Ua NEGATIVE NEGATIVE   RBC / HPF 0-5 0 - 5 RBC/hpf   WBC, UA 0-5 0 - 5 WBC/hpf    Comment:        Reflex urine culture not performed if WBC <=10, OR if Squamous epithelial cells >5. If Squamous epithelial cells >5 suggest recollection.    Bacteria, UA MANY (A) NONE SEEN   Squamous Epithelial / HPF 0-5 0 - 5 /HPF    Comment: Performed at [facility], [address], [place], [place] [address]  Glucose, capillary     Status: Abnormal   Collection Time: 09/26/22  8:48 PM  Result Value Ref Range   Glucose-Capillary 126 (H) 70 - 99 mg/dL    Comment: Glucose reference range applies only to samples taken after fasting for at least 8 hours.  Basic metabolic panel     Status: Abnormal   Collection Time: 09/27/22  5:02 AM  Result Value Ref Range   Sodium 136 135 - 145 mmol/L   Potassium 3.7 3.5 - 5.1 mmol/L   Chloride 99 98 - 111 mmol/L   CO2 28 22 - 32 mmol/L   Glucose, Bld 117 (H) 70 - 99 mg/dL    Comment: Glucose reference range applies only to samples taken after fasting for at least  8 hours.   BUN 15 8 - 23 mg/dL   Creatinine, Ser 1.61 0.44 - 1.00 mg/dL   Calcium 8.1 (L) 8.9 - 10.3 mg/dL   GFR, Estimated >09 >60 mL/min    Comment: (NOTE) Calculated using the CKD-EPI Creatinine Equation (2021)    Anion gap 9 5 - 15    Comment: Performed at [facility], [address], [place], [place] [address]  CBC     Status: Abnormal   Collection Time: 09/27/22  5:02 AM  Result Value Ref Range   WBC 4.4 4.0 - 10.5 K/uL   RBC 3.21 (L) 3.87 - 5.11 MIL/uL   Hemoglobin 10.0 (L) 12.0 - 15.0 g/dL   HCT 81.1 (L) 91.4 - 78.2 %   MCV 99.4 80.0 - 100.0 fL   MCH 31.2 26.0 - 34.0 pg   MCHC 31.3 30.0 - 36.0 g/dL   RDW 95.6 (H) 21.3 - 08.6 %   Platelets 93 (L) 150 - 400 K/uL    Comment: SPECIMEN CHECKED FOR CLOTS Immature Platelet  Fraction may be clinically indicated, consider ordering this additional test [id number] CONSISTENT WITH PREVIOUS RESULT REPEATED TO VERIFY    nRBC 0.0 0.0 - 0.2 %    Comment: Performed at [facility], [address] [place], [place] [address]  Magnesium     Status: None   Collection Time: 09/27/22  5:02 AM  Result Value Ref Range   Magnesium 1.9 1.7 - 2.4 mg/dL    Comment: Performed at [facility], [address] [place], [place] [address]  Glucose, capillary     Status: Abnormal   Collection Time: 09/27/22  7:48 AM  Result Value Ref Range   Glucose-Capillary 144 (H) 70 - 99 mg/dL    Comment: Glucose reference range applies only to samples taken after fasting for at least 8 hours.  Culture, blood (Routine X 2) w Reflex to ID Panel     Status: None (Preliminary result)   Collection Time: 09/27/22  9:03 AM   Specimen: BLOOD  Result Value Ref Range   Specimen Description BLOOD BLOOD RIGHT HAND    Special Requests      BOTTLES DRAWN AEROBIC AND ANAEROBIC Blood Culture adequate volume   Culture      NO GROWTH < 24 HOURS Performed at [facility], [address], [place], [place] [address]    Report Status PENDING   Culture, blood (Routine X 2) w Reflex to ID Panel     Status: None (Preliminary result)   Collection Time: 09/27/22  9:03 AM   Specimen: BLOOD  Result Value Ref Range   Specimen Description BLOOD BLOOD LEFT HAND    Special Requests      BOTTLES DRAWN AEROBIC AND ANAEROBIC Blood Culture adequate volume   Culture      NO GROWTH < 24 HOURS Performed at [facility], [address], [place], [place] [address]    Report Status PENDING   Glucose, capillary     Status: Abnormal   Collection Time: 09/27/22 11:23 AM  Result Value Ref Range   Glucose-Capillary 143 (H) 70 - 99 mg/dL    Comment: Glucose reference range applies only to samples taken after fasting for at least 8 hours.  Glucose, capillary     Status: Abnormal   Collection Time: 09/27/22  4:09 PM  Result Value  Ref Range   Glucose-Capillary 116 (H) 70 - 99 mg/dL    Comment: Glucose reference range applies only to samples taken after fasting for  at least 8 hours.  Glucose, capillary     Status: Abnormal   Collection Time: 09/27/22  9:12 PM  Result Value Ref Range   Glucose-Capillary 202 (H) 70 - 99 mg/dL    Comment: Glucose reference range applies only to samples taken after fasting for at least 8 hours.   Comment 1 Notify RN    Comment 2 Document in Chart   Creatinine, serum     Status: None   Collection Time: 09/28/22  4:49 AM  Result Value Ref Range   Creatinine, Ser 0.63 0.44 - 1.00 mg/dL   GFR, Estimated >11 >91 mL/min    Comment: (NOTE) Calculated using the CKD-EPI Creatinine Equation (2021) Performed at [facility], [address], [place], [place] [address]   Glucose, capillary     Status: Abnormal   Collection Time: 09/28/22  7:04 AM  Result Value Ref Range   Glucose-Capillary 117 (H) 70 - 99 mg/dL    Comment: Glucose reference range applies only to samples taken after fasting for at least 8 hours.  Glucose, capillary     Status: Abnormal   Collection Time: 09/28/22 10:40 AM  Result Value Ref Range   Glucose-Capillary 121 (H) 70 - 99 mg/dL    Comment: Glucose reference range applies only to samples taken after fasting for at least 8 hours.    ECHOCARDIOGRAM COMPLETE  Result Date: 09/27/2022    ECHOCARDIOGRAM REPORT   Patient Name:   BRYSTOL GAILEY Date of Exam: 09/27/2022 Medical Rec #:  [id number]          Height:       62.0 in Accession #:    [id number]         Weight:       280.0 lb Date of Birth:  [birth year]          BSA:          2.206 m Patient Age:    72 years           BP:           104/54 mmHg Patient Gender: F                  HR:           76 bpm. [facility] Procedure: 2D Echo, Cardiac Doppler and Color Doppler Indications:    Bacteremia R78.81  History:        Patient has prior history of Echocardiogram examinations, most                 recent 06/03/2022.  Sepsis; Risk Factors:Dyslipidemia, Former                 Smoker, Hypertension and Diabetes.  Sonographer:    Aron Baba Referring Phys: [id number] CORNELIUS N VAN DAM  Sonographer Comments: Patient is obese. Image acquisition challenging due to respiratory motion. IMPRESSIONS  1. Left ventricular ejection fraction, by estimation, is 65 to 70%. The left ventricle has normal function. The left ventricle has no regional wall motion abnormalities. There is mild left ventricular hypertrophy. Left ventricular diastolic parameters are consistent with Grade I diastolic dysfunction (impaired relaxation). Elevated left atrial pressure.  2. Right ventricular systolic function is normal. The right ventricular size is normal. There is mildly elevated pulmonary artery systolic pressure.  3. Left atrial size was mildly dilated.  4. Right atrial size was mildly dilated.  5. The mitral valve is abnormal. Mild mitral valve regurgitation.  6.  The aortic valve has an indeterminant number of cusps. There is moderate calcification of the aortic valve. There is moderate thickening of the aortic valve. Aortic valve regurgitation is not visualized. Mild aortic valve stenosis. Aortic valve mean gradient measures 18.0 mmHg. Aortic valve peak gradient measures 35.3 mmHg. Aortic valve area, by VTI measures 1.87 cm.  7. Aortic dilatation noted. There is mild dilatation of the ascending aorta, measuring 40 mm.  8. The inferior vena cava is dilated in size with >50% respiratory variability, suggesting right atrial pressure of 8 mmHg. FINDINGS  Left Ventricle: Left ventricular ejection fraction, by estimation, is 65 to 70%. The left ventricle has normal function. The left ventricle has no regional wall motion abnormalities. The left ventricular internal cavity size was normal in size. There is  mild left ventricular hypertrophy. Left ventricular diastolic parameters are consistent with Grade I diastolic dysfunction (impaired relaxation). Elevated  left atrial pressure. Right Ventricle: The right ventricular size is normal. Right vetricular wall thickness was not well visualized. Right ventricular systolic function is normal. There is mildly elevated pulmonary artery systolic pressure. The tricuspid regurgitant velocity  is 2.41 m/s, and with an assumed right atrial pressure of 15 mmHg, the estimated right ventricular systolic pressure is 38.2 mmHg. Left Atrium: Left atrial size was mildly dilated. Right Atrium: Right atrial size was mildly dilated. Pericardium: There is no evidence of pericardial effusion. Mitral Valve: The mitral valve is abnormal. There is mild thickening of the mitral valve leaflet(s). There is mild calcification of the mitral valve leaflet(s). Mild mitral annular calcification. Mild mitral valve regurgitation. Tricuspid Valve: The tricuspid valve is normal in structure. Tricuspid valve regurgitation is not demonstrated. No evidence of tricuspid stenosis. Aortic Valve: The aortic valve has an indeterminant number of cusps. There is moderate calcification of the aortic valve. There is moderate thickening of the aortic valve. There is moderate aortic valve annular calcification. Aortic valve regurgitation is not visualized. Mild aortic stenosis is present. Aortic valve mean gradient measures 18.0 mmHg. Aortic valve peak gradient measures 35.3 mmHg. Aortic valve area, by VTI measures 1.87 cm. Pulmonic Valve: The pulmonic valve was not well visualized. Pulmonic valve regurgitation is not visualized. No evidence of pulmonic stenosis. Aorta: The aortic root is normal in size and structure and aortic dilatation noted. There is mild dilatation of the ascending aorta, measuring 40 mm. Venous: The inferior vena cava is dilated in size with greater than 50% respiratory variability, suggesting right atrial pressure of 8 mmHg. IAS/Shunts: The interatrial septum was not well visualized.  LEFT VENTRICLE PLAX 2D LVIDd:         4.40 cm   Diastology LVIDs:          3.20 cm   LV e' medial:    5.70 cm/s LV PW:         1.10 cm   LV E/e' medial:  20.7 LV IVS:        1.10 cm   LV e' lateral:   9.81 cm/s LVOT diam:     2.10 cm   LV E/e' lateral: 12.0 LV SV:         108 LV SV Index:   49 LVOT Area:     3.46 cm  RIGHT VENTRICLE RV S prime:     12.90 cm/s TAPSE (M-mode): 2.3 cm LEFT ATRIUM              Index        RIGHT ATRIUM  Index LA diam:        4.30 cm  1.95 cm/m   RA Area:     21.30 cm LA Vol (A2C):   65.1 ml  29.52 ml/m  RA Volume:   68.50 ml  31.06 ml/m LA Vol (A4C):   103.0 ml 46.70 ml/m LA Biplane Vol: 89.5 ml  40.58 ml/m  AORTIC VALVE AV Area (Vmax):    1.77 cm AV Area (Vmean):   1.90 cm AV Area (VTI):     1.87 cm AV Vmax:           297.00 cm/s AV Vmean:          191.333 cm/s AV VTI:            0.575 m AV Peak Grad:      35.3 mmHg AV Mean Grad:      18.0 mmHg LVOT Vmax:         152.00 cm/s LVOT Vmean:        105.000 cm/s LVOT VTI:          0.311 m LVOT/AV VTI ratio: 0.54  AORTA Ao Root diam: 3.60 cm Ao Asc diam:  4.00 cm MITRAL VALVE                TRICUSPID VALVE MV Area (PHT): 2.21 cm     TR Peak grad:   23.2 mmHg MV Decel Time: 343 msec     TR Vmax:        241.00 cm/s MV E velocity: 118.00 cm/s MV A velocity: 156.00 cm/s  SHUNTS MV E/A ratio:  0.76         Systemic VTI:  0.31 m                             Systemic Diam: 2.10 cm Dina Rich MD Electronically signed by Dina Rich MD Signature Date/Time: 09/27/2022/12:47:19 PM    Final    CT CHEST W CONTRAST  Result Date: 09/26/2022 CLINICAL DATA:  Soft tissue infection involving the right axilla and right flank EXAM: CT CHEST WITH CONTRAST TECHNIQUE: Multidetector CT imaging of the chest was performed during intravenous contrast administration. RADIATION DOSE REDUCTION: This exam was performed according to the departmental dose-optimization program which includes automated exposure control, adjustment of the mA and/or kV according to patient size and/or use of iterative reconstruction  technique. CONTRAST:  75mL OMNIPAQUE IOHEXOL 300 MG/ML  SOLN COMPARISON:  PET-CT dated 11/18/2021 FINDINGS: Cardiovascular: The heart is normal in size. No pericardial effusion. No evidence of thoracic aortic aneurysm. Atherosclerotic calcifications of the aortic arch. Moderate three-vessel coronary atherosclerosis. Right chest port terminates at the cavoatrial junction. Mediastinum/Nodes: No suspicious mediastinal lymphadenopathy. Visualized thyroid is unremarkable. Lungs/Pleura: Evaluation lung parenchyma is constrained by respiratory motion. Within that constraint, there are patchy/ground-glass opacities in the lateral upper lobes (series 5/image 50), posterior right upper lobe along the major fissure (series 5/image 54) and anterior might middle lobe adjacent to the minor fissure (series 5/image 69) which are new from the prior and favor mild infection/pneumonia. Mild subpleural scarring/atelectasis in the lingula, chronic. Mild dependent atelectasis in the bilateral lower lobes. Numerous hyperdense foci in the posterior lower lobes, favored sequela of prior aspiration. No pleural effusion or pneumothorax. Upper Abdomen: Visualized upper abdomen is notable for a nodular hepatic border, suggesting cirrhosis. Stable 3.2 cm left adrenal nodule, without hypermetabolism on PET, favoring a benign adrenal adenoma. Musculoskeletal: Cutaneous thickening along the right lateral  chest wall ((series 2/image 81) with mild subcutaneous extending to the right axilla, where there is focal subcutaneous stranding/inflammation (series 2/image 44), suggesting cellulitis. However, there is no drainable fluid collection/abscess. Degenerative changes of the visualized thoracolumbar spine. No focal lytic lesions in this patient with history of multiple myeloma. IMPRESSION: Suspected cellulitis along the right axilla/lateral chest wall. No drainable fluid collection/abscess. Scattered mild patchy/ground-glass opacities in the lungs  bilaterally, as described above, new from prior PET. Although poorly evaluated due to motion degradation, this appearance favors mild infection/pneumonia. Additional stable ancillary findings as above. Aortic Atherosclerosis (ICD10-I70.0). Electronically Signed   By: Charline Bills M.D.   On: 09/26/2022 22:00   DG Chest Port 1 View  Result Date: 09/26/2022 CLINICAL DATA:  Sepsis EXAM: PORTABLE CHEST 1 VIEW COMPARISON:  X-ray 05/19/2020 FINDINGS: Enlarged cardiopericardial silhouette. Calcified aorta. Stable interstitial changes. No pneumothorax, effusion or consolidation. Right IJ chest port in place with tip at the SVC right atrial junction. Overlapping cardiac leads. Slight left basilar scar or atelectasis. Osteopenia and degenerative changes. IMPRESSION: Slight left basilar scar or atelectasis. Enlarged heart with calcified aorta.  Chest port Electronically Signed   By: Karen Kays M.D.   On: 09/26/2022 12:29     Assessment & Plan:  ZOEEY STUMBAUGH is a 72 y.o. female who was admitted with weakness and noted to have an MSSA bacteremia.  Also concern for possible right axillary abscess.  Imaging and blood work evaluated by myself.  -I explained to the patient that given her MSSA bacteremia, we will need to remove her Port-A-Cath to allow for complete treatment of her bacteremia.  I also explained that I can unroofed this area of fibrinous exudate in her right axilla to evaluate for possible abscess. -The risk and benefits of right IJ Port-A-Cath removal and incision and drainage of right axillary abscess were discussed including but not limited to bleeding, infection, injury to surrounding structures, and need for additional procedures.  After careful consideration, [name] has decided to proceed with surgery. -Will plan to perform these procedures in the minor procedure room under local anesthesia -IV antibiotics per primary team and ID -NPO -IV fluids -As needed pain control and  antiemetics -Further recommendations to follow procedures  All questions were answered to the satisfaction of the patient.  -- Theophilus Kinds, DO [facility] [address] [place], [place] [address] [phone] (office)

## 2022-09-29 DIAGNOSIS — T80212S Local infection due to central venous catheter, sequela: Secondary | ICD-10-CM

## 2022-09-29 DIAGNOSIS — C9000 Multiple myeloma not having achieved remission: Secondary | ICD-10-CM | POA: Diagnosis not present

## 2022-09-29 DIAGNOSIS — L02419 Cutaneous abscess of limb, unspecified: Secondary | ICD-10-CM

## 2022-09-29 DIAGNOSIS — J9611 Chronic respiratory failure with hypoxia: Secondary | ICD-10-CM | POA: Diagnosis not present

## 2022-09-29 DIAGNOSIS — L03111 Cellulitis of right axilla: Secondary | ICD-10-CM | POA: Diagnosis not present

## 2022-09-29 LAB — CULTURE, BLOOD (ROUTINE X 2)
Culture: NO GROWTH
Special Requests: ADEQUATE
Special Requests: ADEQUATE

## 2022-09-29 LAB — BASIC METABOLIC PANEL
Anion gap: 6 (ref 5–15)
BUN: 19 mg/dL (ref 8–23)
CO2: 27 mmol/L (ref 22–32)
Calcium: 7.7 mg/dL — ABNORMAL LOW (ref 8.9–10.3)
Chloride: 102 mmol/L (ref 98–111)
Creatinine, Ser: 0.77 mg/dL (ref 0.44–1.00)
GFR, Estimated: >60 mL/min (ref >60)
Glucose, Bld: 103 mg/dL — ABNORMAL HIGH (ref 70–99)
Potassium: 3.6 mmol/L (ref 3.5–5.1)
Sodium: 135 mmol/L (ref 135–145)

## 2022-09-29 LAB — CBC WITH DIFFERENTIAL/PLATELET
Eosinophils Absolute: 0 10*3/uL (ref 0.0–0.5)
Lymphs Abs: 0.9 10*3/uL (ref 0.7–4.0)
MCH: 31.3 pg (ref 26.0–34.0)
MCV: 97.5 fL (ref 80.0–100.0)
Monocytes Relative: 11 %
RDW: 16.2 % — ABNORMAL HIGH (ref 11.5–15.5)
nRBC: 0 % (ref 0.0–0.2)

## 2022-09-29 LAB — GLUCOSE, CAPILLARY
Glucose-Capillary: 98 mg/dL (ref 70–99)
Glucose-Capillary: 132 mg/dL — ABNORMAL HIGH (ref 70–99)
Glucose-Capillary: 116 mg/dL — ABNORMAL HIGH (ref 70–99)
Glucose-Capillary: 128 mg/dL — ABNORMAL HIGH (ref 70–99)

## 2022-09-29 LAB — AEROBIC/ANAEROBIC CULTURE W GRAM STAIN (SURGICAL/DEEP WOUND)

## 2022-09-29 MED ORDER — ORAL CARE MOUTH RINSE: 15.0000 mL | OROMUCOSAL | Status: DC | PRN

## 2022-09-29 NOTE — Progress Notes (Addendum)
  Progress Note   Patient: Savannah Burns NGE:[id number] DOB: 20-Aug-1950 DOA: 09/26/2022     3 DOS: the patient was seen and examined on 09/29/2022   Brief hospital course: Savannah Burns was admitted to the hospital with the working diagnosis of skin abscess complicated with MSSA bacteremia complicated with sepsis present on admission.   72 yo female with the past medical history of NASH cirrhosis, T2DM, hypertension, dyslipidemia, COPD, multiple myeloma, and depression who presented with cellulitis and abscess right flank and axilla. She was diagnosed with skin infection about 2 weeks prior to admission and was treated with Bactrim. Her symptoms did not improved and antibiotic was changed to cephalexin, and because of nausea and vomiting was further changed to doxycyline. Unfortunately her symptoms continue to worsen, developing fever and generalized weakness prompting her to come to the ED.  On her initial physical examination her temp was 100,6, and she was tachycardic.   Assessment and Plan: Cellulitis of axilla, right Axillary abscess.  MSSA bacteremia.  Clinically improving, has drain in place after I & D.  07/03 Port removed Echocardiogram with no vegetations.   Follow up cultures with no growth.    Plan to continue antibiotic therapy with oral linezolid while line [place].  No TEE due to comorbid conditions.  Patient will need 4 weeks of IV antibiotic therapy, with cephazolin.  Will follow up with ID timing for new IV.   Chronic respiratory failure with hypoxia (HCC) No signs of exacerbation, continue supplemental 02 per [place]   Multiple myeloma (HCC) Chronic anemia and thrombocytopenia.  Chemotherapy on hold for now.   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  Plt 85  Continue close follow up on cell count.   Obesity, Class III, BMI 40-49.9 (morbid obesity) (HCC) Calculated BMI is 51.1   Diabetes mellitus, type 2 (HCC) Continue glucose cover and  monitoring with insulin sliding scale.  Patient is tolerating po well.  Fasting glucose today is 103    Subjective: Patient is feeling well, no chest pain or dyspnea, no axillary pain.   Physical Exam: Vitals:   09/28/22 1345 09/28/22 2113 09/29/22 0334 09/29/22 1156  BP: (!) 120/57 133/62 (!) 140/62 128/60  Pulse: 80 84 74 72  Resp: 18 20 18 18   Temp: 98.6 F (37 C) 98.7 F (37.1 C) 98.3 F (36.8 C) 98.5 F (36.9 C)  TempSrc:  Oral  Oral  SpO2: 99% 100% 100% 100%  Weight:      Height:       Neurology awake and alert ENT with mild pallor Cardiovascular with S1 and S2 present and regular with positive murmur at the right lower sternal border No JVD No  lower extremity edema Respiratory with no rales or wheezing, no rhonchi Abdomen with no distention  Right axillary wound with dressing in place.  Data Reviewed:    Family Communication: no family at the bedside   Disposition: Status is: Inpatient Remains inpatient appropriate because: pending resumption of IV antibiotic therapy   Planned Discharge Destination: Home     Author: Coralie Keens, MD 09/29/2022 1:43 PM  For on call review [email or web address].

## 2022-09-29 NOTE — Care Management Important Message (Signed)
Important Message  Patient Details  Name: Savannah Burns MRN: [id number] Date of Birth: [birth year]   Medicare Important Message Given:  Yes     Corey Harold 09/29/2022, 11:16 AM

## 2022-09-29 NOTE — Progress Notes (Signed)
      INFECTIOUS DISEASE ATTENDING ADDENDUM:   Date: 09/29/2022  Patient name: [name]  Medical record number: [id number]  Date of birth: 11/19/1950   Repeat blood cutlures taken and NGTD post port removal  She is on zyvox due to lack of IV access  Monitor CBC daily  Hopefully her repeat blood cultures taken after port removal remain negative and we can then place PICC and resume her cefazolin   Paulette Blanch Dam 09/29/2022, 3:26 PM

## 2022-09-29 NOTE — TOC Progression Note (Signed)
Transition of Care [facility]) - Progression Note    Patient Details  Name: Savannah Burns MRN: [id number] Date of Birth: 1950/10/18  [facility] CM/SW Contact  Elliot Gault, LCSW Phone Number: 09/29/2022, 12:13 PM  Clinical Narrative:     TOC following. Per MD, pt will dc home with IV anbx when stable. ID MD has already referred pt to [facility] with [facility]. TOC will assist with [facility] referral. DC timeline not yet known.  Expected Discharge Plan: Home/Self Care Barriers to Discharge: Continued Medical Work up  Expected Discharge Plan and Services                                               Social Determinants of Health (SDOH) Interventions SDOH Screenings   Food Insecurity: Food Insecurity Present (09/26/2022)  Housing: Low Risk  (09/26/2022)  Transportation Needs: No Transportation Needs (09/26/2022)  Utilities: Not At Risk (09/26/2022)  Alcohol Screen: Low Risk  (07/27/2021)  Depression (PHQ2-9): Medium Risk (06/02/2022)  Financial Resource Strain: Medium Risk (07/27/2021)  Physical Activity: Inactive (07/27/2021)  Social Connections: Socially Isolated (07/27/2021)  Stress: Stress Concern Present (07/27/2021)  Tobacco Use: Medium Risk (09/26/2022)    Readmission Risk Interventions     No data to display

## 2022-09-29 NOTE — Assessment & Plan Note (Addendum)
Continue glucose cover and monitoring with insulin sliding scale.  Patient is tolerating po well.  Glucose has been controlled.

## 2022-09-29 NOTE — Progress Notes (Signed)
[facility] Progress Note  1 Day Post-Op  Subjective: Patient seen and examined.  She is resting comfortably in bed.  She denies significant pain at her port removal site and her right axillary wound.  She is being transitioned to oral antibiotics at this time.  Objective: Vital signs in last 24 hours: Temp:  [98.3 F (36.8 C)-98.7 F (37.1 C)] 98.5 F (36.9 C) (07/04 1156) Pulse Rate:  [72-84] 72 (07/04 1156) Resp:  [18-20] 18 (07/04 1156) BP: (120-140)/(57-62) 128/60 (07/04 1156) SpO2:  [99 %-100 %] 100 % (07/04 1156) Last BM Date : 09/27/22  Intake/Output from previous day: 07/03 0701 - 07/04 0700 In: 480 [P.O.:480] Out: 1700 [Urine:1700] Intake/Output this shift: Total I/O In: 120 [P.O.:120] Out: 1000 [Urine:1000]  General appearance: alert, cooperative, and no distress Chest wall: Right IJ Port-A-Cath removal site C/D/I with no erythema or induration, mild tenderness to palpation Skin: Right axillary wound with improving induration and surrounding erythema, no purulent drainage noted on packing  Lab Results:  Recent Labs    09/27/22 0502 09/29/22 0007  WBC 4.4 3.5*  HGB 10.0* 8.7*  HCT 31.9* 27.1*  PLT 93* 85*   BMET Recent Labs    09/27/22 0502 09/28/22 0449 09/29/22 0007  NA 136  --  135  K 3.7  --  3.6  CL 99  --  102  CO2 28  --  27  GLUCOSE 117*  --  103*  BUN 15  --  19  CREATININE 0.90 0.63 0.77  CALCIUM 8.1*  --  7.7*   PT/INR No results for input(s): "LABPROT", "INR" in the last 72 hours.  Studies/Results: No results found.  Anti-infectives: Anti-infectives (From admission, onward)    Start     Dose/Rate Route Frequency Ordered Stop   09/28/22 2200  linezolid (ZYVOX) tablet 600 mg        600 mg Oral Every 12 hours 09/28/22 1612     09/27/22 1400  vancomycin (VANCOREADY) IVPB 1750 mg/350 mL  Status:  Discontinued        1,750 mg 175 mL/hr over 120 Minutes Intravenous Every 24 hours 09/26/22 1249 09/27/22 0731    09/27/22 0830  ceFAZolin (ANCEF) IVPB 2g/100 mL premix  Status:  Discontinued        2 g 200 mL/hr over 30 Minutes Intravenous Every 8 hours 09/27/22 0731 09/28/22 1612   09/26/22 2200  acyclovir (ZOVIRAX) tablet 400 mg        400 mg Oral 2 times daily 09/26/22 1614     09/26/22 1245  vancomycin (VANCOREADY) IVPB 2000 mg/400 mL        2,000 mg 200 mL/hr over 120 Minutes Intravenous  Once 09/26/22 1237 09/26/22 1503   09/26/22 1230  vancomycin (VANCOCIN) IVPB 1000 mg/200 mL premix  Status:  Discontinued        1,000 mg 200 mL/hr over 60 Minutes Intravenous  Once 09/26/22 1218 09/26/22 1218       Assessment/Plan:  Patient is a 72 year old female who was admitted for weakness.  She is status post removal of Port-A-Cath and incision and drainage of right axillary wound on 7/3.  -Wound culture of right axillary wound is demonstrating gram-positive cocci in pairs -Will defer antibiotics to primary team, especially given MSSA bacteremia.  She has been transitioned to oral antibiotics given difficult IV access since port removal -Recommending just external gauze being placed to her right axillary wound.  No need to further pack, as this was not an  abscess cavity -Patient stable for discharge from general surgery standpoint -Care per hospitalist   LOS: 3 days    Renaldo Gornick A Jakell Trusty 09/29/2022

## 2022-09-30 ENCOUNTER — Encounter (HOSPITAL_COMMUNITY): Payer: Self-pay | Admitting: Surgery

## 2022-09-30 DIAGNOSIS — L03111 Cellulitis of right axilla: Secondary | ICD-10-CM | POA: Diagnosis not present

## 2022-09-30 DIAGNOSIS — E1169 Type 2 diabetes mellitus with other specified complication: Secondary | ICD-10-CM

## 2022-09-30 DIAGNOSIS — J9611 Chronic respiratory failure with hypoxia: Secondary | ICD-10-CM | POA: Diagnosis not present

## 2022-09-30 DIAGNOSIS — C9000 Multiple myeloma not having achieved remission: Secondary | ICD-10-CM | POA: Diagnosis not present

## 2022-09-30 DIAGNOSIS — L02419 Cutaneous abscess of limb, unspecified: Secondary | ICD-10-CM | POA: Diagnosis not present

## 2022-09-30 LAB — CULTURE, BLOOD (ROUTINE X 2)
Culture: NO GROWTH
Special Requests: ADEQUATE
Special Requests: ADEQUATE

## 2022-09-30 LAB — CBC
HCT: 29.9 % — ABNORMAL LOW (ref 36.0–46.0)
Hemoglobin: 9.3 g/dL — ABNORMAL LOW (ref 12.0–15.0)
MCH: 30.9 pg (ref 26.0–34.0)
MCHC: 31.1 g/dL (ref 30.0–36.0)
MCV: 99.3 fL (ref 80.0–100.0)
Platelets: 83 10*3/uL — ABNORMAL LOW (ref 150–400)
RBC: 3.01 MIL/uL — ABNORMAL LOW (ref 3.87–5.11)
RDW: 16.1 % — ABNORMAL HIGH (ref 11.5–15.5)
WBC: 2.8 10*3/uL — ABNORMAL LOW (ref 4.0–10.5)
nRBC: 0 % (ref 0.0–0.2)

## 2022-09-30 LAB — CBC WITH DIFFERENTIAL/PLATELET
Abs Immature Granulocytes: 0.01 10*3/uL (ref 0.00–0.07)
Basophils Absolute: 0 10*3/uL (ref 0.0–0.1)
Basophils Relative: 0 %
Eosinophils Relative: 1 %
HCT: 27.1 % — ABNORMAL LOW (ref 36.0–46.0)
Hemoglobin: 8.7 g/dL — ABNORMAL LOW (ref 12.0–15.0)
Immature Granulocytes: 0 %
Lymphocytes Relative: 25 %
MCHC: 32.1 g/dL (ref 30.0–36.0)
Monocytes Absolute: 0.4 10*3/uL (ref 0.1–1.0)
Neutro Abs: 2.2 10*3/uL (ref 1.7–7.7)
Neutrophils Relative %: 63 %
Platelets: 85 10*3/uL — ABNORMAL LOW (ref 150–400)
RBC: 2.78 MIL/uL — ABNORMAL LOW (ref 3.87–5.11)
WBC: 3.5 10*3/uL — ABNORMAL LOW (ref 4.0–10.5)

## 2022-09-30 LAB — GLUCOSE, CAPILLARY
Glucose-Capillary: 100 mg/dL — ABNORMAL HIGH (ref 70–99)
Glucose-Capillary: 120 mg/dL — ABNORMAL HIGH (ref 70–99)
Glucose-Capillary: 136 mg/dL — ABNORMAL HIGH (ref 70–99)
Glucose-Capillary: 107 mg/dL — ABNORMAL HIGH (ref 70–99)

## 2022-09-30 LAB — CREATININE, SERUM
Creatinine, Ser: 0.58 mg/dL (ref 0.44–1.00)
GFR, Estimated: >60 mL/min (ref >60)

## 2022-09-30 MED ORDER — NYSTATIN 100000 UNIT/GM EX POWD
Freq: Three times a day (TID) | CUTANEOUS | Status: DC
  Filled 2022-09-30: qty 15

## 2022-09-30 NOTE — Care Management Important Message (Signed)
Important Message  Patient Details  Name: Savannah Burns MRN: [id number] Date of Birth: Jun 16, 1950   Medicare Important Message Given:  Yes     Corey Harold 09/30/2022, 1:39 PM

## 2022-09-30 NOTE — Progress Notes (Signed)
[facility]  Patient seen and examined.  She is sitting in chair eating her lunch.  She denies any pain at her port removal site for axillary wound unless the areas are being pushed on.  She has no significant complaints at this time.  Exam: Chest: Port-A-Cath removal site C/C/I without erythema or induration Skin: Right axillary wound with improving underlying induration and surrounding erythema, no purulent drainage  Assessment and plan: Patient is a 72 year old female who was admitted for weakness.  She is status post removal of Port-A-Cath and incision and drainage of right axillary wound on 7/3.   -Wound culture of right axillary wound is demonstrating Staph aureus, susceptibilities to follow -Will defer antibiotics to primary team, especially given MSSA bacteremia.  She has been transitioned to oral antibiotics given difficult IV access since port removal -Recommending just external gauze being placed to her right axillary wound -Patient stable for discharge from general surgery standpoint -Care per hospitalist  Theophilus Kinds, DO [facility] [address] [place], [place] [address] [phone] (office)

## 2022-09-30 NOTE — Progress Notes (Addendum)
  Progress Note   Patient: Savannah Burns ZOX:[id number] DOB: 12-05-1950 DOA: 09/26/2022     4 DOS: the patient was seen and examined on 09/30/2022   Brief hospital course: Savannah Burns was admitted to the hospital with the working diagnosis of skin abscess complicated with MSSA bacteremia complicated with sepsis present on admission.   72 yo female with the past medical history of Savannah Burns cirrhosis, T2DM, hypertension, dyslipidemia, COPD, multiple myeloma, and depression who presented with cellulitis and abscess right flank and axilla. She was diagnosed with skin infection about 2 weeks prior to admission and was treated with Bactrim. Her symptoms did not improved and antibiotic was changed to cephalexin, and because of nausea and vomiting was further changed to doxycyline. Unfortunately her symptoms continue to worsen, developing fever and generalized weakness prompting her to come to the ED.  On her initial physical examination her temp was 100,6, and she was tachycardic.   CT chest with cellulitis along the right axilla/ lateral chest wall. No drainable fluid collection or abscess.     Assessment and Plan: Cellulitis of axilla, right Axillary abscess.  MSSA bacteremia.   07/03 axillary I&D plus Port removed Echocardiogram with no vegetations.  Follow up cultures with no growth.   Wbc 2.8   Clinically continue to improve.   Plan to continue antibiotic therapy with oral linezolid while line Holliday.  No TEE due to comorbid conditions.  Patient will need 4 weeks of IV antibiotic therapy, with cephazolin.  Will follow up with ID timing for new IV.   Chronic respiratory failure with hypoxia (HCC) No signs of exacerbation, continue supplemental 02 per Savannah Burns   Multiple myeloma (HCC) Chronic anemia and thrombocytopenia.  Chemotherapy on hold for now.   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   Obesity, Class III,  BMI 40-49.9 (morbid obesity) (HCC) Calculated BMI is 51.1   Diabetes mellitus, type 2 (HCC) Continue glucose cover and monitoring with insulin sliding scale.  Patient is tolerating po well.  Glucose has been controlled.         Subjective: Patient is feeling well, no chest pain or dyspnea, axillary pain is controlled.   Physical Exam: Vitals:   09/29/22 1156 09/29/22 1945 09/30/22 0446 09/30/22 1308  BP: 128/60 (!) 123/51 115/62 (!) 120/94  Pulse: 72 76 73 76  Resp: 18 17 18 17   Temp: 98.5 F (36.9 C) 98.4 F (36.9 C) 98.5 F (36.9 C) 97.7 F (36.5 C)  TempSrc: Oral Oral Oral   SpO2: 100% 100% 100% 100%  Weight:      Height:       Neurology awake and alert ENT with mild pallor Cardiovascular with S1 and S2 present and rhythmic with no gallops, rubs or murmurs Respiratory with no rales or wheezing Abdomen with no distention  No lower extremity edema  Data Reviewed:    Family Communication: no family at the bedside   Disposition: Status is: Inpatient Remains inpatient appropriate because: pending resumption of IV antibiotics   Planned Discharge Destination: Home    Author: Coralie Keens, MD 09/30/2022 4:09 PM  For on call review [email or web address].

## 2022-09-30 NOTE — TOC Progression Note (Signed)
Transition of Care [facility]) - Progression Note    Patient Details  Name: Savannah Burns MRN: [id number] Date of Birth: 06-09-1950  Transition of Care [facility]) CM/SW Contact  Elliot Gault, LCSW Phone Number: 09/30/2022, 12:44 PM  Clinical Narrative:     TOC following. Per MD, plan is for line holiday until at least Monday and then PICC placement and dc home with IV antibiotics.   Spoke with pt and gdtr today to discuss dc plan. CMS provider options reviewed. Referred to [facility] and [facility] at their request.  TOC will follow up Monday to further assist with dc plan.  Expected Discharge Plan: Home w Home Health Services Barriers to Discharge: Continued Medical Work up  Expected Discharge Plan and Services In-house Referral: Clinical Social Work   Post Acute Care Choice: Home Health Living arrangements for the past 2 months: Single Family Home                           HH Arranged: RN, IV Antibiotics HH Agency: [facility] Date [facility] Agency Contacted: 09/30/22   Representative spoke with at [facility] Agency: Clifton Custard   Social Determinants of Health (SDOH) Interventions SDOH Screenings   Food Insecurity: Food Insecurity Present (09/26/2022)  Housing: Low Risk  (09/26/2022)  Transportation Needs: No Transportation Needs (09/26/2022)  Utilities: Not At Risk (09/26/2022)  Alcohol Screen: Low Risk  (07/27/2021)  Depression (PHQ2-9): Medium Risk (06/02/2022)  Financial Resource Strain: Medium Risk (07/27/2021)  Physical Activity: Inactive (07/27/2021)  Social Connections: Socially Isolated (07/27/2021)  Stress: Stress Concern Present (07/27/2021)  Tobacco Use: Medium Risk (09/26/2022)    Readmission Risk Interventions     No data to display

## 2022-10-01 ENCOUNTER — Other Ambulatory Visit: Payer: Self-pay

## 2022-10-01 DIAGNOSIS — E1169 Type 2 diabetes mellitus with other specified complication: Secondary | ICD-10-CM | POA: Diagnosis not present

## 2022-10-01 DIAGNOSIS — R7881 Bacteremia: Secondary | ICD-10-CM | POA: Diagnosis not present

## 2022-10-01 DIAGNOSIS — T80212S Local infection due to central venous catheter, sequela: Secondary | ICD-10-CM | POA: Diagnosis not present

## 2022-10-01 LAB — GLUCOSE, CAPILLARY
Glucose-Capillary: 87 mg/dL (ref 70–99)
Glucose-Capillary: 92 mg/dL (ref 70–99)
Glucose-Capillary: 134 mg/dL — ABNORMAL HIGH (ref 70–99)
Glucose-Capillary: 99 mg/dL (ref 70–99)

## 2022-10-01 LAB — BASIC METABOLIC PANEL
Anion gap: 5 (ref 5–15)
BUN: 13 mg/dL (ref 8–23)
CO2: 31 mmol/L (ref 22–32)
Calcium: 8 mg/dL — ABNORMAL LOW (ref 8.9–10.3)
Chloride: 101 mmol/L (ref 98–111)
Creatinine, Ser: 0.58 mg/dL (ref 0.44–1.00)
GFR, Estimated: >60 mL/min (ref >60)
Glucose, Bld: 100 mg/dL — ABNORMAL HIGH (ref 70–99)
Potassium: 3.6 mmol/L (ref 3.5–5.1)
Sodium: 137 mmol/L (ref 135–145)

## 2022-10-01 LAB — CULTURE, BLOOD (ROUTINE X 2): Culture: NO GROWTH

## 2022-10-01 LAB — CBC
HCT: 28.3 % — ABNORMAL LOW (ref 36.0–46.0)
Hemoglobin: 8.8 g/dL — ABNORMAL LOW (ref 12.0–15.0)
MCH: 30.6 pg (ref 26.0–34.0)
MCHC: 31.1 g/dL (ref 30.0–36.0)
MCV: 98.3 fL (ref 80.0–100.0)
Platelets: 93 10*3/uL — ABNORMAL LOW (ref 150–400)
RBC: 2.88 MIL/uL — ABNORMAL LOW (ref 3.87–5.11)
RDW: 16 % — ABNORMAL HIGH (ref 11.5–15.5)
WBC: 2.8 10*3/uL — ABNORMAL LOW (ref 4.0–10.5)
nRBC: 0 % (ref 0.0–0.2)

## 2022-10-01 LAB — AEROBIC/ANAEROBIC CULTURE W GRAM STAIN (SURGICAL/DEEP WOUND)

## 2022-10-01 NOTE — Plan of Care (Signed)
  Problem: Coping: Goal: Level of anxiety will decrease Outcome: Adequate for Discharge   

## 2022-10-01 NOTE — Progress Notes (Signed)
Notified Dr. Thomes Dinning that the rash on patient's back was worse tonight than it was this morning before I left work. I wanted to make sure it was not shingles so he did come up to verify that it was not shingles. Patient does complain of pain when her back is rubbed but stated the cool wash cloth felt good on her skin.

## 2022-10-01 NOTE — Progress Notes (Signed)
      INFECTIOUS DISEASE ATTENDING ADDENDUM:   Date: 10/01/2022  Patient name: Savannah Burns  Medical record number: [id number]  Date of birth: June 06, 1950   Repeat blood cutlures taken and NGTD post port removal at nearly 3 days  She is on zyvox due to lack of IV access  Monitor CBC daily  Hopefully her repeat blood cultures taken after port removal remain negative and we can then place PICC and resume her cefazolin and complete total of 4 weeks of postoperative antibiotics,   Acey Lav 10/01/2022, 6:25 PM

## 2022-10-01 NOTE — Progress Notes (Signed)
Dressing changed this morning. Tech and I gave patient a bath and changed linens to bed. During bath I noticed that patient had a rash to her back that is generalized all over. She stated that it itched a little. I notified MD per flowsheet protocol

## 2022-10-01 NOTE — Progress Notes (Signed)
Progress Note   Patient: Savannah Burns ZOX:[id number] DOB: 05-10-1950 DOA: 09/26/2022     5 DOS: the patient was seen and examined on 10/01/2022   Brief hospital course: [name] was admitted to the hospital with the working diagnosis of skin abscess complicated with MSSA bacteremia complicated with sepsis present on admission.   72 yo female with the past medical history of NASH cirrhosis, T2DM, hypertension, dyslipidemia, COPD, multiple myeloma, and depression who presented with cellulitis and abscess right flank and axilla. She was diagnosed with skin infection about 2 weeks prior to admission and was treated with Bactrim. Her symptoms did not improved and antibiotic was changed to cephalexin, and because of nausea and vomiting was further changed to doxycyline. Unfortunately her symptoms continue to worsen, developing fever and generalized weakness prompting her to come to the ED.  On her initial physical examination her temp was 100,6, and she was tachycardic.   CT chest with cellulitis along the right axilla/ lateral chest wall. No drainable fluid collection or abscess.     Assessment and Plan: Cellulitis of axilla, right Axillary abscess.  MSSA bacteremia.   07/03 axillary I&D plus Port removed Echocardiogram with no vegetations.  Follow up cultures with no growth.   Wbc 2.8   Clinically continue improving.  Plan to continue antibiotic therapy with oral linezolid while line Holliday.  No TEE due to comorbid conditions.  Patient will need 4 weeks of IV antibiotic therapy, with cephazolin.  Reaching 72 hours without positive retaken blood cultures; placing orders for PICC line.  Chronic respiratory failure with hypoxia (HCC) No signs of exacerbation, continue supplemental 02 per [place]   Multiple myeloma (HCC) Chronic anemia and thrombocytopenia.  Chemotherapy on hold for now.   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   Obesity, Class III, BMI 40-49.9 (morbid obesity) (HCC) Calculated BMI is 51.1   Diabetes mellitus, type 2 (HCC) Continue glucose cover and monitoring with insulin sliding scale.  Patient is tolerating po well.  Glucose has been controlled.    Subjective: Afebrile, no chest pain, no nausea or vomiting.  Physical Exam: Vitals:   09/30/22 1308 09/30/22 2047 10/01/22 0618 10/01/22 1259  BP: (!) 120/94 117/61 132/62 138/65  Pulse: 76 74 71 78  Resp: 17 18 18 17   Temp: 97.7 F (36.5 C) 98.3 F (36.8 C) 98.1 F (36.7 C) 98.9 F (37.2 C)  TempSrc:  Oral Oral   SpO2: 100% 100% 100% 99%  Weight:      Height:       General exam: Alert, awake, oriented x 3;  in no acute distress.  Reporting feeling sleepy. Respiratory system: Clear to auscultation. Respiratory effort normal.  Good saturation on chronic supplementation.  No using accessory muscles. Cardiovascular system:RRR. No rubs or gallops; unable to assess JVD with body habitus. Gastrointestinal system: Abdomen is obese, nondistended, soft and nontender. No organomegaly or masses felt. Normal bowel sounds heard. Central nervous system: Alert and oriented. No focal neurological deficits. Extremities: No cyanosis or clubbing. Skin: No petechiae. Psychiatry: Judgement and insight appear normal. Mood & affect appropriate.   Data Reviewed: CBC: WBC 3.8, hemoglobin 8.8 and platelet counts 93 K Basic metabolic panel: Sodium 137, potassium 3.6, chloride 101, bicarb 31, BUN 13, creatinine 0.58 and GFR >60   Family Communication: no family at the bedside   Disposition: Status is: Inpatient Remains inpatient appropriate because: pending resumption of IV antibiotics,  PICC line placement and OPAT creation.   Planned Discharge Destination: Home   Author: Vassie Loll, MD 10/01/2022 4:54 PM  For on call review [email or web address].

## 2022-10-02 DIAGNOSIS — T80212S Local infection due to central venous catheter, sequela: Secondary | ICD-10-CM | POA: Diagnosis not present

## 2022-10-02 DIAGNOSIS — E1169 Type 2 diabetes mellitus with other specified complication: Secondary | ICD-10-CM | POA: Diagnosis not present

## 2022-10-02 DIAGNOSIS — R7881 Bacteremia: Secondary | ICD-10-CM | POA: Diagnosis not present

## 2022-10-02 LAB — GLUCOSE, CAPILLARY
Glucose-Capillary: 86 mg/dL (ref 70–99)
Glucose-Capillary: 105 mg/dL — ABNORMAL HIGH (ref 70–99)
Glucose-Capillary: 107 mg/dL — ABNORMAL HIGH (ref 70–99)
Glucose-Capillary: 91 mg/dL (ref 70–99)

## 2022-10-02 LAB — CULTURE, BLOOD (ROUTINE X 2)
Culture: NO GROWTH
Special Requests: ADEQUATE

## 2022-10-02 MED ORDER — SODIUM CHLORIDE 0.9% FLUSH: 10.0000 mL | INTRAVENOUS | Status: DC | PRN

## 2022-10-02 MED ORDER — CEFAZOLIN SODIUM-DEXTROSE 2-4 GM/100ML-% IV SOLN
2.0000 g | Freq: Three times a day (TID) | INTRAVENOUS | Status: DC
  Administered 2022-10-02 – 2022-10-03 (×3): 2 g via INTRAVENOUS
  Filled 2022-10-02 (×3): qty 100

## 2022-10-02 MED ORDER — SODIUM CHLORIDE 0.9% FLUSH
10.0000 mL | Freq: Two times a day (BID) | INTRAVENOUS | Status: DC
  Administered 2022-10-02 – 2022-10-03 (×3): 10 mL

## 2022-10-02 NOTE — Progress Notes (Signed)
Pharmacy Antibiotic Note  Savannah Burns is a 72 y.o. female admitted on 09/26/2022 with mssa bacteremia with port, axillary abscess.  Pharmacy has been consulted for cefazolin dosing.  Plan: Cefazolin 2 grams IV every 8 hours through 10/26/22 Monitor clinical progress, cultures/sensitivities, renal function, abx plan   Height: 5\' 2"  (157.5 cm) Weight: 127 kg (280 lb) IBW/kg (Calculated) : 50.1  Temp (24hrs), Avg:98.6 F (37 C), Min:98.4 F (36.9 C), Max:98.9 F (37.2 C)  Recent Labs  Lab 09/26/22 1159 09/26/22 1441 09/27/22 0502 09/28/22 0449 09/29/22 0007 09/30/22 0508 10/01/22 0500  WBC 4.9  --  4.4  --  3.5* 2.8* 2.8*  CREATININE 0.68  --  0.90 0.63 0.77 0.58 0.58  LATICACIDVEN 2.0* 1.2  --   --   --   --   --     Estimated Creatinine Clearance: 80 mL/min (by C-G formula based on SCr of 0.58 mg/dL).    Allergies  Allergen Reactions   Keflex [Cephalexin] Nausea And Vomiting    Antimicrobials this admission: 7/1 vancomycin x 1 7/1 acyclovir >>  7/2 cefazolin >> 7/3; 7/7 >> (7/31) 7/3 Linezolid >>  Dose adjustments this admission:  Microbiology results: 7/4 BCx: ng x 3d 7/3 Bcx: ng x 4d 7/3 Wound Cx: gram + cocci 7/2 BCx: ngF 7/1 BCx x 2 sets: mssa per bcid   Thank you for allowing pharmacy to be a part of this patient's care.   Signe Colt, PharmD 10/02/2022 5:30 PM  **Pharmacist phone directory can be found on [email or web address] listed under [facility]**

## 2022-10-02 NOTE — Progress Notes (Signed)
      INFECTIOUS DISEASE ATTENDING ADDENDUM:   Date: 10/02/2022  Patient name: Savannah Burns  Medical record number: [id number]  Date of birth: [birth year]    Patient has negative blood cultures before port removal x 4 days and negative ones post port removal x 3 days  PICC has been placed and cefazolin resumed.  Diagnosis: MSSA bacteremia  Culture Result: MSSA  Allergies  Allergen Reactions   Keflex [Cephalexin] Nausea And Vomiting    OPAT Orders Discharge antibiotics to be given via PICC line Discharge antibiotics: Cefazolin 2 grams IV q 8 hours Duration: 4 weeks End Date: 10/26/2022  [facility] Care Per Protocol:  Home health RN for IV administration and teaching; PICC line care and labs.    Labs weekly while on IV antibiotics: _x_ CBC with differential _x_ BMP   _x_ Please pull PIC at completion of IV antibiotics __ Please leave PIC in place until doctor has seen patient or been notified  Fax weekly labs to [phone]  Clinic Follow Up Appt:   [name] has an appointment on 10/25/2022 at 10 Am with Rexene Alberts, NP  at  [name], which  is located in the [facility] at  [address] in [place].  [address], which is located to the left of the elevators.  Phone: [phone]  Fax: [phone]  [email or web address]  The patient should arrive 30 minutes prior to their appoitment.  I will sign off for now.  Please call with further questions. If clinic appt does not work for pt my staff can reschedule though we are down a fair number of providers vs a few months ago.   Acey Lav 10/02/2022, 9:01 PM

## 2022-10-02 NOTE — Progress Notes (Signed)
Progress Note   Patient: Savannah Burns NWG:[id number] DOB: Aug 12, 1950 DOA: 09/26/2022     6 DOS: the patient was seen and examined on 10/02/2022   Brief hospital course: Savannah Burns was admitted to the hospital with the working diagnosis of skin abscess complicated with MSSA bacteremia complicated with sepsis present on admission.   72 yo female with the past medical history of NASH cirrhosis, T2DM, hypertension, dyslipidemia, COPD, multiple myeloma, and depression who presented with cellulitis and abscess right flank and axilla. She was diagnosed with skin infection about 2 weeks prior to admission and was treated with Bactrim. Her symptoms did not improved and antibiotic was changed to cephalexin, and because of nausea and vomiting was further changed to doxycyline. Unfortunately her symptoms continue to worsen, developing fever and generalized weakness prompting her to come to the ED.  On her initial physical examination her temp was 100,6, and she was tachycardic.   CT chest with cellulitis along the right axilla/ lateral chest wall. No drainable fluid collection or abscess.     Assessment and Plan: Cellulitis of axilla, right Axillary abscess.  MSSA bacteremia.   07/03 axillary I&D plus Port removed Echocardiogram with no vegetations.  Follow up cultures with no growth.   Wbc 2.8   Clinically continue improving.  Plan to continue antibiotic therapy with oral linezolid while line Holliday.  No TEE due to comorbid conditions.  Patient will need 4 weeks of IV antibiotic therapy, with cephazolin.  72 hours without positive retaken blood cultures; PICC line has been placed (10/02/2022) -Case discussed with infectious disease service and pharmacy for OPAT. -TOC arranging home health RN services.  Chronic respiratory failure with hypoxia (HCC) No signs of exacerbation, continue supplemental 02 per [place]   Multiple myeloma (HCC) Chronic anemia and thrombocytopenia.  Chemotherapy on  hold for now.   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   Obesity, Class III, BMI 40-49.9 (morbid obesity) (HCC) Calculated BMI is 51.1   Diabetes mellitus, type 2 (HCC) Continue glucose cover and monitoring with insulin sliding scale.  Patient is tolerating po well.  Glucose has been controlled.    Subjective: No fever, no chest pain, no nausea or vomiting; left upper extremity PICC line in place; TOC assisting with home health services initiation for outpatient antibiotic therapy.  Physical Exam: Vitals:   10/01/22 1259 10/01/22 2119 10/02/22 0631 10/02/22 1253  BP: 138/65 113/63 129/65 122/71  Pulse: 78 73 70 74  Resp: 17 18 18 20   Temp: 98.9 F (37.2 C) 98.9 F (37.2 C) 98.5 F (36.9 C) 98.4 F (36.9 C)  TempSrc:  Oral Oral Oral  SpO2: 99% 100% 100% 99%  Weight:      Height:       General exam: Alert, awake, oriented x 3; no fever, no chest pain, no nausea, no vomiting. Respiratory system: No using accessory muscles; good saturation on chronic supplementation. Cardiovascular system:RRR.  No rubs, no gallops. Gastrointestinal system: Abdomen is obese, nondistended, soft and nontender. No organomegaly or masses felt. Normal bowel sounds heard. Central nervous system: Alert and oriented. No focal neurological deficits. Extremities: No cyanosis or clubbing. Skin: No petechiae; left upper extremity PICC line in place. Psychiatry: Judgement and insight appear normal. Mood & affect appropriate.   Latest data Reviewed: CBC: WBC 3.8, hemoglobin 8.8 and platelet counts 93 K Basic metabolic panel: Sodium 137, potassium 3.6, chloride 101, bicarb 31, BUN 13, creatinine  0.58 and GFR >60   Family Communication: no family at the bedside   Disposition: Status is: Inpatient Remains inpatient appropriate because: pending resumption of IV antibiotics, PICC line placement and OPAT creation.   Planned Discharge  Destination: Home with home health services  Author: Vassie Loll, MD 10/02/2022 5:51 PM  For on call review [email or web address].

## 2022-10-02 NOTE — TOC Progression Note (Signed)
Transition of Care [facility]) - Progression Note    Patient Details  Name: Savannah Burns MRN: [id number] Date of Birth: 05-Jan-1951  Transition of Care [facility]) CM/SW Contact  Catalina Gravel, LCSW Phone Number: 10/02/2022, 10:07 AM  Clinical Narrative:    Pt plan remains the same. DC Monday after PICC and [facility] to provide [facility] per note,HHRN orders to be requested when ready.. TOC to follow.   Expected Discharge Plan: Home w Home Health Services Barriers to Discharge: Continued Medical Work up  Expected Discharge Plan and Services In-house Referral: Clinical Social Work   Post Acute Care Choice: Home Health Living arrangements for the past 2 months: Single Family Home                           HH Arranged: RN, IV Antibiotics HH Agency: [facility] Date [facility] Agency Contacted: 09/30/22   Representative spoke with at [facility] Agency: Clifton Custard   Social Determinants of Health (SDOH) Interventions SDOH Screenings   Food Insecurity: Food Insecurity Present (09/26/2022)  Housing: Low Risk  (09/26/2022)  Transportation Needs: No Transportation Needs (09/26/2022)  Utilities: Not At Risk (09/26/2022)  Alcohol Screen: Low Risk  (07/27/2021)  Depression (PHQ2-9): Medium Risk (06/02/2022)  Financial Resource Strain: Medium Risk (07/27/2021)  Physical Activity: Inactive (07/27/2021)  Social Connections: Socially Isolated (07/27/2021)  Stress: Stress Concern Present (07/27/2021)  Tobacco Use: Medium Risk (09/30/2022)    Readmission Risk Interventions     No data to display

## 2022-10-02 NOTE — Progress Notes (Signed)
Peripherally Inserted Central Catheter Placement  The IV Nurse has discussed with the patient and/or persons authorized to consent for the patient, the purpose of this procedure and the potential benefits and risks involved with this procedure.  The benefits include less needle sticks, lab draws from the catheter, and the patient may be discharged home with the catheter. Risks include, but not limited to, infection, bleeding, blood clot (thrombus formation), and puncture of an artery; nerve damage and irregular heartbeat and possibility to perform a PICC exchange if needed/ordered by physician.  Alternatives to this procedure were also discussed.  Bard Power PICC patient education guide, fact sheet on infection prevention and patient information card has been provided to patient /or left at bedside.    PICC Placement Documentation  PICC Single Lumen 10/02/22 Left Cephalic 42 cm 0 cm (Active)  Indication for Insertion or Continuance of Line Home intravenous therapies (PICC only) 10/02/22 1043  Exposed Catheter (cm) 0 cm 10/02/22 1043  Site Assessment Clean, Dry, Intact 10/02/22 1043  Line Status Saline locked;Flushed;Blood return noted 10/02/22 1043  Dressing Type Transparent;Securing device 10/02/22 1043  Dressing Status Antimicrobial disc in place;Clean, Dry, Intact 10/02/22 1043  Safety Lock Not Applicable 10/02/22 1043  Line Care Connections checked and tightened 10/02/22 1043  Line Adjustment (NICU/IV Team Only) No 10/02/22 1043  Dressing Intervention New dressing 10/02/22 1043  Dressing Change Due 10/09/22 10/02/22 1043       Burnard Bunting Chenice 10/02/2022, 10:44 AM

## 2022-10-03 DIAGNOSIS — T80212S Local infection due to central venous catheter, sequela: Secondary | ICD-10-CM | POA: Diagnosis not present

## 2022-10-03 DIAGNOSIS — J9611 Chronic respiratory failure with hypoxia: Secondary | ICD-10-CM | POA: Diagnosis not present

## 2022-10-03 DIAGNOSIS — F339 Major depressive disorder, recurrent, unspecified: Secondary | ICD-10-CM

## 2022-10-03 DIAGNOSIS — B9561 Methicillin susceptible Staphylococcus aureus infection as the cause of diseases classified elsewhere: Secondary | ICD-10-CM | POA: Diagnosis not present

## 2022-10-03 DIAGNOSIS — R7881 Bacteremia: Secondary | ICD-10-CM | POA: Diagnosis not present

## 2022-10-03 DIAGNOSIS — C9000 Multiple myeloma not having achieved remission: Secondary | ICD-10-CM | POA: Diagnosis not present

## 2022-10-03 DIAGNOSIS — K219 Gastro-esophageal reflux disease without esophagitis: Secondary | ICD-10-CM

## 2022-10-03 LAB — GLUCOSE, CAPILLARY
Glucose-Capillary: 115 mg/dL — ABNORMAL HIGH (ref 70–99)
Glucose-Capillary: 129 mg/dL — ABNORMAL HIGH (ref 70–99)

## 2022-10-03 LAB — AEROBIC/ANAEROBIC CULTURE W GRAM STAIN (SURGICAL/DEEP WOUND)

## 2022-10-03 LAB — CREATININE, SERUM
Creatinine, Ser: 0.58 mg/dL (ref 0.44–1.00)
GFR, Estimated: >60 mL/min (ref >60)

## 2022-10-03 LAB — CULTURE, BLOOD (ROUTINE X 2)
Culture: NO GROWTH
Culture: NO GROWTH
Special Requests: ADEQUATE

## 2022-10-03 MED ORDER — HEPARIN SOD (PORK) LOCK FLUSH 100 UNIT/ML IV SOLN
250.0000 [IU] | INTRAVENOUS | Status: AC | PRN
  Administered 2022-10-03: 250 [IU]
  Filled 2022-10-03: qty 5

## 2022-10-03 MED ORDER — NYSTATIN 100000 UNIT/GM EX POWD: Freq: Two times a day (BID) | CUTANEOUS | 0 refills | Status: DC

## 2022-10-03 MED ORDER — CEFAZOLIN IV (FOR PTA / DISCHARGE USE ONLY)
2.0000 g | Freq: Three times a day (TID) | INTRAVENOUS | 0 refills | Status: AC
Start: 2022-10-03 — End: 2022-10-26

## 2022-10-03 MED ORDER — VENLAFAXINE HCL ER 150 MG PO CP24: 150.0000 mg | ORAL_CAPSULE | Freq: Every day | ORAL | 2 refills | Status: DC

## 2022-10-03 MED ORDER — TORSEMIDE 20 MG PO TABS: 50.0000 mg | ORAL_TABLET | Freq: Two times a day (BID) | ORAL | Status: DC

## 2022-10-03 MED ORDER — CEFAZOLIN SODIUM-DEXTROSE 2-4 GM/100ML-% IV SOLN: 2.0000 g | Freq: Three times a day (TID) | INTRAVENOUS | 0 refills | Status: AC

## 2022-10-03 MED ORDER — ESOMEPRAZOLE MAGNESIUM 40 MG PO CPDR
40.0000 mg | DELAYED_RELEASE_CAPSULE | Freq: Every day | ORAL | 2 refills | Status: DC
Start: 2022-10-03 — End: 2023-06-09

## 2022-10-03 NOTE — Progress Notes (Signed)
PHARMACY CONSULT NOTE FOR:  OUTPATIENT  PARENTERAL ANTIBIOTIC THERAPY (OPAT)  Indication: MSSA bacteremia Regimen: Ancef 2g q8 hours End date: 10/26/22  IV antibiotic discharge orders are pended. To discharging provider:  please sign these orders via discharge navigator,  Select New Orders & click on the button choice - Manage This Unsigned Work.     Thank you for allowing pharmacy to be a part of this patient's care.  Sheppard Coil PharmD., BCPS Clinical Pharmacist 10/03/2022 6:27 AM

## 2022-10-03 NOTE — TOC Transition Note (Signed)
Transition of Care [facility]) - CM/SW Discharge Note   Patient Details  Name: Savannah Burns MRN: [id number] Date of Birth: 1951-02-03  [facility]) CM/SW Contact:  Elliot Gault, LCSW Phone Number: 10/03/2022, 10:43 AM   Clinical Narrative:     Pt medically stable for dc today per MD. Myles Gip at [place] and Kandee Keen at [place] of dc. Pam states she will do teaching with family at bedside today prior to dc. HH will start tomorrow.  Updated pt's RN. No other TOC needs.  Final next level of care: Home w Home Health Services Barriers to Discharge: Barriers Resolved   Patient Goals and CMS Choice CMS Medicare.gov Compare Post Acute Care list provided to:: Patient Choice offered to / list presented to : Patient  Discharge Placement                         Discharge Plan and Services Additional resources added to the After Visit Summary for   In-house Referral: Clinical Social Work   Post Acute Care Choice: Home Health                    HH Arranged: RN [facility] Agency: [facility] Date [facility] Agency Contacted: 09/30/22   Representative spoke with at [facility] Agency: Cindie  Social Determinants of Health (SDOH) Interventions SDOH Screenings   Food Insecurity: Food Insecurity Present (09/26/2022)  Housing: Low Risk  (09/26/2022)  Transportation Needs: No Transportation Needs (09/26/2022)  Utilities: Not At Risk (09/26/2022)  Alcohol Screen: Low Risk  (07/27/2021)  Depression (PHQ2-9): Medium Risk (06/02/2022)  Financial Resource Strain: Medium Risk (07/27/2021)  Physical Activity: Inactive (07/27/2021)  Social Connections: Socially Isolated (07/27/2021)  Stress: Stress Concern Present (07/27/2021)  Tobacco Use: Medium Risk (09/30/2022)     Readmission Risk Interventions     No data to display

## 2022-10-03 NOTE — Progress Notes (Signed)
Mobility Specialist Progress Note:    10/03/22 0945  Mobility  Activity Ambulated with assistance to bathroom  Level of Assistance Contact guard assist, steadying assist  Assistive Device Front wheel walker  Distance Ambulated (ft) 12 ft  Range of Motion/Exercises Active;All extremities  Activity Response Tolerated well  Mobility Referral Yes  $Mobility charge 1 Mobility  Mobility Specialist Start Time (ACUTE ONLY) 0945  Mobility Specialist Stop Time (ACUTE ONLY) 1000  Mobility Specialist Time Calculation (min) (ACUTE ONLY) 15 min   Pt received ambulating from bathroom via RW. Family in room assisting. Tolerated ambulation in room well, asx throughout. Pt was returned to bed, all needs met, call bell in reach.   Feliciana Rossetti Mobility Specialist Please contact via Special educational needs teacher or  Rehab office at [phone]

## 2022-10-03 NOTE — Care Management Important Message (Signed)
Important Message  Patient Details  Name: Savannah Burns MRN: [id number] Date of Birth: [birth year]   Medicare Important Message Given:  Yes     Corey Harold 10/03/2022, 10:37 AM

## 2022-10-03 NOTE — Discharge Summary (Signed)
Physician Discharge Summary   Patient: Savannah Burns MRN: [id number] DOB: 04-27-1950  Admit date:     09/26/2022  Discharge date: 10/03/22  Discharge Physician: Vassie Loll   PCP: Raliegh Ip, DO   Recommendations at discharge:  Repeat basic metabolic panel to follow electrolytes and renal function Repeat CBC to follow hemoglobin trend/stability Atrial patient has follow-up with oncology service as previously instructed. Continue assisting patient with weight loss management.  Discharge Diagnoses: Active Problems:   Cellulitis of axilla, right   Chronic respiratory failure with hypoxia (HCC)   Multiple myeloma (HCC)   Obesity, Class III, BMI 40-49.9 (morbid obesity) (HCC)   Diabetes mellitus, type 2 (HCC)   Port or reservoir infection   Axillary abscess   Gastroesophageal reflux disease without esophagitis  Hospital Course: Savannah Burns was admitted to the hospital with the working diagnosis of skin abscess complicated with MSSA bacteremia complicated with sepsis present on admission.   72 yo female with the past medical history of NASH cirrhosis, T2DM, hypertension, dyslipidemia, COPD, multiple myeloma, and depression who presented with cellulitis and abscess right flank and axilla. She was diagnosed with skin infection about 2 weeks prior to admission and was treated with Bactrim. Her symptoms did not improved and antibiotic was changed to cephalexin, and because of nausea and vomiting was further changed to doxycyline. Unfortunately her symptoms continue to worsen, developing fever and generalized weakness prompting her to come to the ED.  On her initial physical examination her temp was 100,6, and she was tachycardic.   CT chest with cellulitis along the right axilla/ lateral chest wall.   Assessment and Plan: Cellulitis of axilla, right Axillary abscess.  MSSA bacteremia.   07/03 axillary I&D plus Port removed Echocardiogram with no vegetations.  Follow up  cultures with no growth.   Wbc 2.8   Clinically continue improving.  No TEE due to comorbid conditions.  Patient will need 4 weeks of IV antibiotic therapy, with cephazolin.  72 hours without positive retaken blood cultures; PICC line has been placed (10/02/2022) -Case discussed with infectious disease service and pharmacy for OPAT. -Patient will be discharged home on cefepime to complete treatment until 10/26/2022. -TOC arranging home health RN services.  Chronic respiratory failure with hypoxia (HCC) No signs of exacerbation, continue supplemental 02 per [place]   Multiple myeloma (HCC) Chronic anemia and thrombocytopenia.  Chemotherapy on hold for now.   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   Obesity, Class III, BMI 40-49.9 (morbid obesity) (HCC) Calculated BMI is 51.1   Diabetes mellitus, type 2 (HCC) Continue glucose cover and monitoring with insulin sliding scale.  Patient is tolerating po well.  Glucose has been controlled.   Consultants: Infectious disease; general surgery. Procedures performed: See below for x-ray reports.  PICC line placement and I&D of right axillary region with Port-A-Cath removal. Disposition: Home health Diet recommendation: Heart healthy/modified carbohydrate diet.  DISCHARGE MEDICATION: Allergies as of 10/03/2022       Reactions   Keflex [cephalexin] Nausea And Vomiting        Medication List     STOP taking these medications    Alpha-Lipoic Acid 600 MG Caps   bacitracin ointment   carvedilol 6.25 MG tablet Commonly known as: COREG   desvenlafaxine 100 MG 24 hr tablet Commonly known as: Pristiq Replaced by: venlafaxine XR 150 MG 24 hr capsule   doxycycline 100 MG tablet Commonly known as:  VIBRA-TABS   furosemide 20 MG tablet Commonly known as: LASIX   omeprazole 20 MG capsule Commonly known as: PRILOSEC       TAKE these medications    acyclovir 400  MG tablet Commonly known as: ZOVIRAX Take 1 tablet (400 mg total) by mouth 2 (two) times daily.   B-12 Compliance Injection 1000 MCG/ML Kit Generic drug: Cyanocobalamin Inject 1,000 mcg as directed every 30 (thirty) days.   baclofen 10 MG tablet Commonly known as: LIORESAL Take 10 mg by mouth 2 (two) times daily.   ceFAZolin  IVPB Commonly known as: ANCEF Inject 2 g into the vein every 8 (eight) hours for 23 days. Indication:  Bacteremia First Dose: No Last Day of Therapy:  10/26/22 Labs - Once weekly:  CBC/D and BMP, Labs - Once weekly: ESR and CRP Method of administration: IV Push Method of administration may be changed at the discretion of home infusion pharmacist based upon assessment of the patient and/or caregiver's ability to self-administer the medication ordered.   ceFAZolin 2-4 GM/100ML-% IVPB Commonly known as: ANCEF Inject 100 mLs (2 g total) into the vein every 8 (eight) hours for 73 doses.   clobetasol 0.05 % external solution Commonly known as: TEMOVATE Apply 1 Application topically 2 (two) times daily.   dexamethasone 4 MG tablet Commonly known as: DECADRON Take 5 tablets (20 mg total) by mouth once a week.   esomeprazole 40 MG capsule Commonly known as: NexIUM Take 1 capsule (40 mg total) by mouth daily. What changed:  medication strength how much to take when to take this   ferrous sulfate 325 (65 FE) MG tablet Take 1 tablet (325 mg total) by mouth daily with breakfast.   lenalidomide 15 MG capsule Commonly known as: REVLIMID TAKE 1 CAPSULE BY MOUTH EVERY DAY FOR 21 DAYS ON THEN 7 DAYS OFF What changed:  how much to take how to take this when to take this   lidocaine-prilocaine cream Commonly known as: EMLA Apply 1 Application topically as needed. Apply a small amount to port a cath site and cover with plastic wrap 1 hour prior to infusion appointments   lovastatin 20 MG tablet Commonly known as: MEVACOR Take 1 tablet (20 mg total) by mouth  at bedtime.   nystatin powder Commonly known as: MYCOSTATIN/NYSTOP Apply topically 2 (two) times daily.   oxyCODONE-acetaminophen 10-325 MG tablet Commonly known as: PERCOCET Take 1 tablet by mouth every 6 (six) hours as needed for pain.   OXYGEN Inhale 3 L into the lungs continuous.   Ozempic (0.25 or 0.5 MG/DOSE) 2 MG/3ML Sopn Generic drug: Semaglutide(0.25 or 0.5MG /DOS) Inject 2 mg into the skin once a week.   potassium chloride SA 20 MEQ tablet Commonly known as: KLOR-CON M Take 2 tablets (40 mEq total) by mouth 2 (two) times daily. What changed: when to take this   prochlorperazine 10 MG tablet Commonly known as: COMPAZINE Take 1 tablet (10 mg total) by mouth every 6 (six) hours as needed for nausea or vomiting.   torsemide 20 MG tablet Commonly known as: DEMADEX Take 2.5 tablets (50 mg total) by mouth 2 (two) times daily.   True Metrix Air Glucose Meter w/Device Kit Check BS daily Dx E11.9   True Metrix Blood Glucose Test test strip Generic drug: glucose blood Check BS daily Dx E11.9   True Metrix Level 1 Low Soln Use w/ glucose monitor Dx E11.9   TRUEplus Lancets 33G Misc Check BS daily Dx E11.9   venlafaxine XR  150 MG 24 hr capsule Commonly known as: EFFEXOR-XR Take 1 capsule (150 mg total) by mouth daily with breakfast. Start taking on: October 04, 2022 Replaces: desvenlafaxine 100 MG 24 hr tablet   Vitamin D 50 MCG (2000 UT) tablet Take 2,000 Units by mouth daily.               Discharge Care Instructions  (From admission, onward)           Start     Ordered   10/03/22 0000  Change dressing on IV access line weekly and PRN  (Home infusion instructions - [facility] )        10/03/22 1019            Follow-up Information     Pappayliou, Santina Evans A, DO. Call.   Specialty: General Surgery Why: As needed Contact information: [address] [place] [facility] [address] [phone]         Raliegh Ip, DO.  Schedule an appointment as soon as possible for a visit in 2 week(s).   Specialty: Family Medicine Contact information: [address] [place] [address] [phone]                Discharge Exam: Ceasar Mons Weights   09/26/22 1134  Weight: 127 kg   General exam: Alert, awake, oriented x 3; no fever, no chest pain, no nausea, no vomiting. Respiratory system: No using accessory muscles; good saturation on chronic supplementation. Cardiovascular system:RRR.  No rubs, no gallops. Gastrointestinal system: Abdomen is obese, nondistended, soft and nontender. No organomegaly or masses felt. Normal bowel sounds heard. Central nervous system: Alert and oriented. No focal neurological deficits. Extremities: No cyanosis or clubbing. Skin: No petechiae; left upper extremity PICC line in place. Psychiatry: Judgement and insight appear normal. Mood & affect appropriate.   Condition at discharge: Stable and improved.  The results of significant diagnostics from this hospitalization (including imaging, microbiology, ancillary and laboratory) are listed below for reference.   Imaging Studies: [place] EKG SITE RITE  Result Date: 10/01/2022 If Site Rite image not attached, placement could not be confirmed due to current cardiac rhythm.  ECHOCARDIOGRAM COMPLETE  Result Date: 09/27/2022    ECHOCARDIOGRAM REPORT   Patient Name:   SHONTEL BERANEK Date of Exam: 09/27/2022 Medical Rec #:  [id number]          Height:       62.0 in Accession #:    [id number]         Weight:       280.0 lb Date of Birth:  1950/10/04          BSA:          2.206 m Patient Age:    72 years           BP:           104/54 mmHg Patient Gender: F                  HR:           76 bpm. [facility] Procedure: 2D Echo, Cardiac Doppler and Color Doppler Indications:    Bacteremia R78.81  History:        Patient has prior history of Echocardiogram examinations, most                 recent 06/03/2022. Sepsis; Risk  Factors:Dyslipidemia, Former  Smoker, Hypertension and Diabetes.  Sonographer:    Aron Baba Referring Phys: [id number] CORNELI[address] N VAN DAM  Sonographer Comments: Patient is obese. Image acquisition challenging due to respiratory motion. IMPRESSIONS  1. Left ventricular ejection fraction, by estimation, is 65 to 70%. The left ventricle has normal function. The left ventricle has no regional wall motion abnormalities. There is mild left ventricular hypertrophy. Left ventricular diastolic parameters are consistent with Grade I diastolic dysfunction (impaired relaxation). Elevated left atrial pressure.  2. Right ventricular systolic function is normal. The right ventricular size is normal. There is mildly elevated pulmonary artery systolic pressure.  3. Left atrial size was mildly dilated.  4. Right atrial size was mildly dilated.  5. The mitral valve is abnormal. Mild mitral valve regurgitation.  6. The aortic valve has an indeterminant number of cusps. There is moderate calcification of the aortic valve. There is moderate thickening of the aortic valve. Aortic valve regurgitation is not visualized. Mild aortic valve stenosis. Aortic valve mean gradient measures 18.0 mmHg. Aortic valve peak gradient measures 35.3 mmHg. Aortic valve area, by VTI measures 1.87 cm.  7. Aortic dilatation noted. There is mild dilatation of the ascending aorta, measuring 40 mm.  8. The inferior vena cava is dilated in size with >50% respiratory variability, suggesting right atrial pressure of 8 mmHg. FINDINGS  Left Ventricle: Left ventricular ejection fraction, by estimation, is 65 to 70%. The left ventricle has normal function. The left ventricle has no regional wall motion abnormalities. The left ventricular internal cavity size was normal in size. There is  mild left ventricular hypertrophy. Left ventricular diastolic parameters are consistent with Grade I diastolic dysfunction (impaired relaxation). Elevated left atrial  pressure. Right Ventricle: The right ventricular size is normal. Right vetricular wall thickness was not well visualized. Right ventricular systolic function is normal. There is mildly elevated pulmonary artery systolic pressure. The tricuspid regurgitant velocity  is 2.41 m/s, and with an assumed right atrial pressure of 15 mmHg, the estimated right ventricular systolic pressure is 38.2 mmHg. Left Atrium: Left atrial size was mildly dilated. Right Atrium: Right atrial size was mildly dilated. Pericardium: There is no evidence of pericardial effusion. Mitral Valve: The mitral valve is abnormal. There is mild thickening of the mitral valve leaflet(s). There is mild calcification of the mitral valve leaflet(s). Mild mitral annular calcification. Mild mitral valve regurgitation. Tricuspid Valve: The tricuspid valve is normal in structure. Tricuspid valve regurgitation is not demonstrated. No evidence of tricuspid stenosis. Aortic Valve: The aortic valve has an indeterminant number of cusps. There is moderate calcification of the aortic valve. There is moderate thickening of the aortic valve. There is moderate aortic valve annular calcification. Aortic valve regurgitation is not visualized. Mild aortic stenosis is present. Aortic valve mean gradient measures 18.0 mmHg. Aortic valve peak gradient measures 35.3 mmHg. Aortic valve area, by VTI measures 1.87 cm. Pulmonic Valve: The pulmonic valve was not well visualized. Pulmonic valve regurgitation is not visualized. No evidence of pulmonic stenosis. Aorta: The aortic root is normal in size and structure and aortic dilatation noted. There is mild dilatation of the ascending aorta, measuring 40 mm. Venous: The inferior vena cava is dilated in size with greater than 50% respiratory variability, suggesting right atrial pressure of 8 mmHg. IAS/Shunts: The interatrial septum was not well visualized.  LEFT VENTRICLE PLAX 2D LVIDd:         4.40 cm   Diastology LVIDs:          3.20 cm  LV e' medial:    5.70 cm/s LV PW:         1.10 cm   LV E/e' medial:  20.7 LV IVS:        1.10 cm   LV e' lateral:   9.81 cm/s LVOT diam:     2.10 cm   LV E/e' lateral: 12.0 LV SV:         108 LV SV Index:   49 LVOT Area:     3.46 cm  RIGHT VENTRICLE RV S prime:     12.90 cm/s TAPSE (M-mode): 2.3 cm LEFT ATRIUM              Index        RIGHT ATRIUM           Index LA diam:        4.30 cm  1.95 cm/m   RA Area:     21.30 cm LA Vol (A2C):   65.1 ml  29.52 ml/m  RA Volume:   68.50 ml  31.06 ml/m LA Vol (A4C):   103.0 ml 46.70 ml/m LA Biplane Vol: 89.5 ml  40.58 ml/m  AORTIC VALVE AV Area (Vmax):    1.77 cm AV Area (Vmean):   1.90 cm AV Area (VTI):     1.87 cm AV Vmax:           297.00 cm/s AV Vmean:          191.333 cm/s AV VTI:            0.575 m AV Peak Grad:      35.3 mmHg AV Mean Grad:      18.0 mmHg LVOT Vmax:         152.00 cm/s LVOT Vmean:        105.000 cm/s LVOT VTI:          0.311 m LVOT/AV VTI ratio: 0.54  AORTA Ao Root diam: 3.60 cm Ao Asc diam:  4.00 cm MITRAL VALVE                TRICUSPID VALVE MV Area (PHT): 2.21 cm     TR Peak grad:   23.2 mmHg MV Decel Time: 343 msec     TR Vmax:        241.00 cm/s MV E velocity: 118.00 cm/s MV A velocity: 156.00 cm/s  SHUNTS MV E/A ratio:  0.76         Systemic VTI:  0.31 m                             Systemic Diam: 2.10 cm Dina Rich MD Electronically signed by Dina Rich MD Signature Date/Time: 09/27/2022/12:47:19 PM    Final    CT CHEST W CONTRAST  Result Date: 09/26/2022 CLINICAL DATA:  Soft tissue infection involving the right axilla and right flank EXAM: CT CHEST WITH CONTRAST TECHNIQUE: Multidetector CT imaging of the chest was performed during intravenous contrast administration. RADIATION DOSE REDUCTION: This exam was performed according to the departmental dose-optimization program which includes automated exposure control, adjustment of the mA and/or kV according to patient size and/or use of iterative reconstruction  technique. CONTRAST:  75mL OMNIPAQUE IOHEXOL 300 MG/ML  SOLN COMPARISON:  PET-CT dated 11/18/2021 FINDINGS: Cardiovascular: The heart is normal in size. No pericardial effusion. No evidence of thoracic aortic aneurysm. Atherosclerotic calcifications of the aortic arch. Moderate three-vessel coronary atherosclerosis. Right chest port terminates at the cavoatrial  junction. Mediastinum/Nodes: No suspicious mediastinal lymphadenopathy. Visualized thyroid is unremarkable. Lungs/Pleura: Evaluation lung parenchyma is constrained by respiratory motion. Within that constraint, there are patchy/ground-glass opacities in the lateral upper lobes (series 5/image 50), posterior right upper lobe along the major fissure (series 5/image 54) and anterior might middle lobe adjacent to the minor fissure (series 5/image 69) which are new from the prior and favor mild infection/pneumonia. Mild subpleural scarring/atelectasis in the lingula, chronic. Mild dependent atelectasis in the bilateral lower lobes. Numerous hyperdense foci in the posterior lower lobes, favored sequela of prior aspiration. No pleural effusion or pneumothorax. Upper Abdomen: Visualized upper abdomen is notable for a nodular hepatic border, suggesting cirrhosis. Stable 3.2 cm left adrenal nodule, without hypermetabolism on PET, favoring a benign adrenal adenoma. Musculoskeletal: Cutaneous thickening along the right lateral chest wall ((series 2/image 81) with mild subcutaneous extending to the right axilla, where there is focal subcutaneous stranding/inflammation (series 2/image 44), suggesting cellulitis. However, there is no drainable fluid collection/abscess. Degenerative changes of the visualized thoracolumbar spine. No focal lytic lesions in this patient with history of multiple myeloma. IMPRESSION: Suspected cellulitis along the right axilla/lateral chest wall. No drainable fluid collection/abscess. Scattered mild patchy/ground-glass opacities in the lungs  bilaterally, as described above, new from prior PET. Although poorly evaluated due to motion degradation, this appearance favors mild infection/pneumonia. Additional stable ancillary findings as above. Aortic Atherosclerosis (ICD10-I70.0). Electronically Signed   By: Charline Bills M.D.   On: 09/26/2022 22:00   DG Chest Port 1 View  Result Date: 09/26/2022 CLINICAL DATA:  Sepsis EXAM: PORTABLE CHEST 1 VIEW COMPARISON:  X-ray 05/19/2020 FINDINGS: Enlarged cardiopericardial silhouette. Calcified aorta. Stable interstitial changes. No pneumothorax, effusion or consolidation. Right IJ chest port in place with tip at the SVC right atrial junction. Overlapping cardiac leads. Slight left basilar scar or atelectasis. Osteopenia and degenerative changes. IMPRESSION: Slight left basilar scar or atelectasis. Enlarged heart with calcified aorta.  Chest port Electronically Signed   By: Karen Kays M.D.   On: 09/26/2022 12:29    Microbiology: Results for orders placed or performed during the hospital encounter of 09/26/22  Blood Culture (routine x 2)     Status: Abnormal   Collection Time: 09/26/22 11:43 AM   Specimen: BLOOD  Result Value Ref Range Status   Specimen Description   Final    BLOOD BLOOD LEFT ARM Performed at [facility], [address] [place], [place] [address]    Special Requests   Final    BOTTLES DRAWN AEROBIC AND ANAEROBIC Blood Culture adequate volume Performed at [facility], [address], [place], [place] [address]    Culture  Setup Time   Final    GRAM POSITIVE COCCI IN CLUSTERS IN BOTH AEROBIC AND ANAEROBIC BOTTLES CRITICAL RESULT CALLED TO, READ BACK BY AND VERIFIED WITH: S RICHARDSON AT 0024 09/27/2022 BY T KENNEDY CRITICAL RESULT CALLED TO, READ BACK BY AND VERIFIED WITH: Berenda Morale RN 09/27/22 @ 0410 BY AB Performed at [facility], [address], [place], [place] [address]    Culture STAPHYLOCOCCUS AUREUS (A)  Final   Report Status 09/29/2022 FINAL  Final    Organism ID, Bacteria STAPHYLOCOCCUS AUREUS  Final      Susceptibility   Staphylococcus aureus - MIC*    CIPROFLOXACIN >=8 RESISTANT Resistant     ERYTHROMYCIN <=0.25 SENSITIVE Sensitive     GENTAMICIN <=0.5 SENSITIVE Sensitive     OXACILLIN 0.5 SENSITIVE Sensitive     TETRACYCLINE <=1 SENSITIVE Sensitive  VANCOMYCIN 1 SENSITIVE Sensitive     TRIMETH/SULFA <=10 SENSITIVE Sensitive     CLINDAMYCIN <=0.25 SENSITIVE Sensitive     RIFAMPIN <=0.5 SENSITIVE Sensitive     Inducible Clindamycin NEGATIVE Sensitive     LINEZOLID 2 SENSITIVE Sensitive     * STAPHYLOCOCCUS AUREUS  Blood Culture ID Panel (Reflexed)     Status: Abnormal   Collection Time: 09/26/22 11:43 AM  Result Value Ref Range Status   Enterococcus faecalis NOT DETECTED NOT DETECTED Final   Enterococcus Faecium NOT DETECTED NOT DETECTED Final   Listeria monocytogenes NOT DETECTED NOT DETECTED Final   Staphylococcus species DETECTED (A) NOT DETECTED Final    Comment: CRITICAL RESULT CALLED TO, READ BACK BY AND VERIFIED WITH: S RICHARDSON RN 09/27/22 @ 0410 BY AB    Staphylococcus aureus (BCID) DETECTED (A) NOT DETECTED Final    Comment: CRITICAL RESULT CALLED TO, READ BACK BY AND VERIFIED WITH: S RICHARDSON RN 09/27/22 @ 0410 BY AB    Staphylococcus epidermidis NOT DETECTED NOT DETECTED Final   Staphylococcus lugdunensis NOT DETECTED NOT DETECTED Final   Streptococcus species NOT DETECTED NOT DETECTED Final   Streptococcus agalactiae NOT DETECTED NOT DETECTED Final   Streptococcus pneumoniae NOT DETECTED NOT DETECTED Final   Streptococcus pyogenes NOT DETECTED NOT DETECTED Final   A.calcoaceticus-baumannii NOT DETECTED NOT DETECTED Final   Bacteroides fragilis NOT DETECTED NOT DETECTED Final   Enterobacterales NOT DETECTED NOT DETECTED Final   Enterobacter cloacae complex NOT DETECTED NOT DETECTED Final   Escherichia coli NOT DETECTED NOT DETECTED Final   Klebsiella aerogenes NOT DETECTED NOT DETECTED Final   Klebsiella  oxytoca NOT DETECTED NOT DETECTED Final   Klebsiella pneumoniae NOT DETECTED NOT DETECTED Final   Proteus species NOT DETECTED NOT DETECTED Final   Salmonella species NOT DETECTED NOT DETECTED Final   Serratia marcescens NOT DETECTED NOT DETECTED Final   Haemophilus influenzae NOT DETECTED NOT DETECTED Final   Neisseria meningitidis NOT DETECTED NOT DETECTED Final   Pseudomonas aeruginosa NOT DETECTED NOT DETECTED Final   Stenotrophomonas maltophilia NOT DETECTED NOT DETECTED Final   Candida albicans NOT DETECTED NOT DETECTED Final   Candida auris NOT DETECTED NOT DETECTED Final   Candida glabrata NOT DETECTED NOT DETECTED Final   Candida krusei NOT DETECTED NOT DETECTED Final   Candida parapsilosis NOT DETECTED NOT DETECTED Final   Candida tropicalis NOT DETECTED NOT DETECTED Final   Cryptococcus neoformans/gattii NOT DETECTED NOT DETECTED Final   Meth resistant mecA/C and MREJ NOT DETECTED NOT DETECTED Final    Comment: Performed at [facility], [address], [place], [place] [address]  Blood Culture (routine x 2)     Status: Abnormal   Collection Time: 09/26/22 11:59 AM   Specimen: BLOOD  Result Value Ref Range Status   Specimen Description   Final    BLOOD BLOOD RIGHT HAND Performed at [facility], [address], [place], [place] [address]    Special Requests   Final    Blood Culture adequate volume BOTTLES DRAWN AEROBIC AND ANAEROBIC Performed at [facility], [address], [place], [place] [address]    Culture  Setup Time   Final    GRAM POSITIVE COCCI IN CLUSTERS IN BOTH AEROBIC AND ANAEROBIC BOTTLES CRITICAL VALUE NOTED.  VALUE IS CONSISTENT WITH PREVIOUSLY REPORTED AND CALLED VALUE. Performed at [facility], [address], [place], [place] [address]    Culture (A)  Final    STAPHYLOCOCCUS AUREUS SUSCEPTIBILITIES PERFORMED ON PREVIOUS CULTURE WITHIN  THE LAST 5 DAYS. Performed at [facility], [address] [place], [place] [address]    Report  Status 09/29/2022 FINAL  Final  Resp panel by RT-PCR (RSV, Flu A&B, Covid) Anterior Nasal Swab     Status: None   Collection Time: 09/26/22 12:12 PM   Specimen: Anterior Nasal Swab  Result Value Ref Range Status   SARS Coronavirus 2 by RT PCR NEGATIVE NEGATIVE Final    Comment: (NOTE) SARS-CoV-2 target nucleic acids are NOT DETECTED.  The SARS-CoV-2 RNA is generally detectable in upper respiratory specimens during the acute phase of infection. The lowest concentration of SARS-CoV-2 viral copies this assay can detect is 138 copies/mL. A negative result does not preclude SARS-Cov-2 infection and should not be used as the sole basis for treatment or other patient management decisions. A negative result may occur with  improper specimen collection/handling, submission of specimen other than nasopharyngeal swab, presence of viral mutation(s) within the areas targeted by this assay, and inadequate number of viral copies(<138 copies/mL). A negative result must be combined with clinical observations, patient history, and epidemiological information. The expected result is Negative.  Fact Sheet for Patients:  BloggerCourse.com  Fact Sheet for Healthcare Providers:  SeriousBroker.it  This test is no t yet approved or cleared by the [place] FDA and  has been authorized for detection and/or diagnosis of SARS-CoV-2 by FDA under an Emergency Use Authorization (EUA). This EUA will remain  in effect (meaning this test can be used) for the duration of the COVID-19 declaration under Section 564(b)(1) of the Act, 21 U.S.C.section 360bbb-3(b)(1), unless the authorization is terminated  or revoked sooner.       Influenza A by PCR NEGATIVE NEGATIVE Final   Influenza B by PCR NEGATIVE NEGATIVE Final    Comment: (NOTE) The Xpert Xpress SARS-CoV-2/FLU/RSV plus assay is intended as an aid in the diagnosis of influenza from Nasopharyngeal swab  specimens and should not be used as a sole basis for treatment. Nasal washings and aspirates are unacceptable for Xpert Xpress SARS-CoV-2/FLU/RSV testing.  Fact Sheet for Patients: BloggerCourse.com  Fact Sheet for Healthcare Providers: SeriousBroker.it  This test is not yet approved or cleared by the [place] FDA and has been authorized for detection and/or diagnosis of SARS-CoV-2 by FDA under an Emergency Use Authorization (EUA). This EUA will remain in effect (meaning this test can be used) for the duration of the COVID-19 declaration under Section 564(b)(1) of the Act, 21 U.S.C. section 360bbb-3(b)(1), unless the authorization is terminated or revoked.     Resp Syncytial Virus by PCR NEGATIVE NEGATIVE Final    Comment: (NOTE) Fact Sheet for Patients: BloggerCourse.com  Fact Sheet for Healthcare Providers: SeriousBroker.it  This test is not yet approved or cleared by the [place] FDA and has been authorized for detection and/or diagnosis of SARS-CoV-2 by FDA under an Emergency Use Authorization (EUA). This EUA will remain in effect (meaning this test can be used) for the duration of the COVID-19 declaration under Section 564(b)(1) of the Act, 21 U.S.C. section 360bbb-3(b)(1), unless the authorization is terminated or revoked.  Performed at [facility], [address] [place], [place] [address]   Culture, blood (Routine X 2) w Reflex to ID Panel     Status: None   Collection Time: 09/27/22  9:03 AM   Specimen: BLOOD  Result Value Ref Range Status   Specimen Description BLOOD BLOOD RIGHT HAND  Final   Special Requests   Final    BOTTLES  DRAWN AEROBIC AND ANAEROBIC Blood Culture adequate volume   Culture   Final    NO GROWTH 5 DAYS Performed at [facility], [address], [place], [place] [address]    Report Status 10/02/2022 FINAL  Final  Culture, blood  (Routine X 2) w Reflex to ID Panel     Status: None   Collection Time: 09/27/22  9:03 AM   Specimen: BLOOD  Result Value Ref Range Status   Specimen Description BLOOD BLOOD LEFT HAND  Final   Special Requests   Final    BOTTLES DRAWN AEROBIC AND ANAEROBIC Blood Culture adequate volume   Culture   Final    NO GROWTH 5 DAYS Performed at [facility], [address], [place], [place] [address]    Report Status 10/02/2022 FINAL  Final  Aerobic/Anaerobic Culture w Gram Stain (surgical/deep wound)     Status: None (Preliminary result)   Collection Time: 09/28/22 11:30 AM   Specimen: Wound; Body Fluid  Result Value Ref Range Status   Specimen Description   Final    WOUND Performed at [facility] [address], [place], [place] [address]    Special Requests   Final    NONE Performed at [name], [address], [place], [place] [address]    Gram Stain   Final    FEW WBC PRESENT, PREDOMINANTLY PMN RARE GRAM POSITIVE COCCI IN PAIRS Performed at [facility], [address], [place], [place] [address]    Culture   Final    FEW STAPHYLOCOCCUS AUREUS NO ANAEROBES ISOLATED; CULTURE IN PROGRESS FOR 5 DAYS    Report Status PENDING  Incomplete   Organism ID, Bacteria STAPHYLOCOCCUS AUREUS  Final      Susceptibility   Staphylococcus aureus - MIC*    CIPROFLOXACIN >=8 RESISTANT Resistant     ERYTHROMYCIN <=0.25 SENSITIVE Sensitive     GENTAMICIN <=0.5 SENSITIVE Sensitive     OXACILLIN 0.5 SENSITIVE Sensitive     TETRACYCLINE <=1 SENSITIVE Sensitive     VANCOMYCIN <=0.5 SENSITIVE Sensitive     TRIMETH/SULFA <=10 SENSITIVE Sensitive     CLINDAMYCIN <=0.25 SENSITIVE Sensitive     RIFAMPIN <=0.5 SENSITIVE Sensitive     Inducible Clindamycin NEGATIVE Sensitive     LINEZOLID 2 SENSITIVE Sensitive     * FEW STAPHYLOCOCCUS AUREUS  Culture, blood (Routine X 2) w Reflex to ID Panel     Status: None   Collection Time: 09/28/22  6:39 PM   Specimen: BLOOD  Result Value Ref Range Status    Specimen Description BLOOD BLOOD RIGHT HAND  Final   Special Requests   Final    BOTTLES DRAWN AEROBIC AND ANAEROBIC Blood Culture adequate volume   Culture   Final    NO GROWTH 5 DAYS Performed at [facility], [address] [place], [place] [address]    Report Status 10/03/2022 FINAL  Final  Culture, blood (Routine X 2) w Reflex to ID Panel     Status: None   Collection Time: 09/28/22  6:39 PM   Specimen: BLOOD  Result Value Ref Range Status   Specimen Description BLOOD BLOOD RIGHT HAND  Final   Special Requests   Final    BOTTLES DRAWN AEROBIC ONLY Blood Culture results may not be optimal due to an excessive volume of blood received in culture bottles   Culture   Final    NO GROWTH 5 DAYS Performed at [facility], [address] [place], [place] [address]  Report Status 10/03/2022 FINAL  Final  Culture, blood (Routine X 2) w Reflex to ID Panel     Status: None (Preliminary result)   Collection Time: 09/29/22 12:04 AM   Specimen: BLOOD  Result Value Ref Range Status   Specimen Description BLOOD RIGHT ANTECUBITAL  Final   Special Requests   Final    BOTTLES DRAWN AEROBIC AND ANAEROBIC Blood Culture adequate volume   Culture   Final    NO GROWTH 4 DAYS Performed at [facility], [address], [place], [place] [address]    Report Status PENDING  Incomplete  Culture, blood (Routine X 2) w Reflex to ID Panel     Status: None (Preliminary result)   Collection Time: 09/29/22 12:08 AM   Specimen: BLOOD  Result Value Ref Range Status   Specimen Description BLOOD BLOOD RIGHT HAND  Final   Special Requests   Final    BOTTLES DRAWN AEROBIC AND ANAEROBIC Blood Culture adequate volume   Culture   Final    NO GROWTH 4 DAYS Performed at [facility], [address] [place], [place] [address]    Report Status PENDING  Incomplete    Labs: CBC: Recent Labs  Lab 09/26/22 1159 09/27/22 0502 09/29/22 0007 09/30/22 0508 10/01/22 0500  WBC 4.9 4.4 3.5* 2.8* 2.8*  NEUTROABS  4.0  --  2.2  --   --   HGB 10.2* 10.0* 8.7* 9.3* 8.8*  HCT 32.1* 31.9* 27.1* 29.9* 28.3*  MCV 98.8 99.4 97.5 99.3 98.3  PLT 89* 93* 85* 83* 93*   Basic Metabolic Panel: Recent Labs  Lab 09/26/22 1159 09/27/22 0502 09/28/22 0449 09/29/22 0007 09/30/22 0508 10/01/22 0500 10/03/22 0414  NA 133* 136  --  135  --  137  --   K 4.1 3.7  --  3.6  --  3.6  --   CL 99 99  --  102  --  101  --   CO2 26 28  --  27  --  31  --   GLUCOSE 177* 117*  --  103*  --  100*  --   BUN 11 15  --  19  --  13  --   CREATININE 0.68 0.90 0.63 0.77 0.58 0.58 0.58  CALCIUM 8.1* 8.1*  --  7.7*  --  8.0*  --   MG  --  1.9  --   --   --   --   --    Liver Function Tests: Recent Labs  Lab 09/26/22 1159  AST 22  ALT 17  ALKPHOS 91  BILITOT 0.8  PROT 5.8*  ALBUMIN 3.1*   CBG: Recent Labs  Lab 10/02/22 0716 10/02/22 1127 10/02/22 1613 10/02/22 2127 10/03/22 0711  GLUCAP 86 105* 107* 91 115*    Discharge time spent: greater than 30 minutes.  Signed: Vassie Loll, MD [facility] 10/03/2022

## 2022-10-03 NOTE — Progress Notes (Signed)
Patient sates understanding of discharge instructions 

## 2022-10-04 ENCOUNTER — Ambulatory Visit: Payer: Self-pay

## 2022-10-04 ENCOUNTER — Telehealth: Payer: Self-pay

## 2022-10-04 ENCOUNTER — Ambulatory Visit: Payer: Medicare PPO | Admitting: General Surgery

## 2022-10-04 DIAGNOSIS — A419 Sepsis, unspecified organism: Secondary | ICD-10-CM | POA: Diagnosis not present

## 2022-10-04 DIAGNOSIS — L03313 Cellulitis of chest wall: Secondary | ICD-10-CM | POA: Diagnosis not present

## 2022-10-04 DIAGNOSIS — I119 Hypertensive heart disease without heart failure: Secondary | ICD-10-CM | POA: Diagnosis not present

## 2022-10-04 DIAGNOSIS — C9000 Multiple myeloma not having achieved remission: Secondary | ICD-10-CM | POA: Diagnosis not present

## 2022-10-04 DIAGNOSIS — D630 Anemia in neoplastic disease: Secondary | ICD-10-CM | POA: Diagnosis not present

## 2022-10-04 DIAGNOSIS — E119 Type 2 diabetes mellitus without complications: Secondary | ICD-10-CM | POA: Diagnosis not present

## 2022-10-04 DIAGNOSIS — L02411 Cutaneous abscess of right axilla: Secondary | ICD-10-CM | POA: Diagnosis not present

## 2022-10-04 DIAGNOSIS — L03111 Cellulitis of right axilla: Secondary | ICD-10-CM | POA: Diagnosis not present

## 2022-10-04 DIAGNOSIS — L03319 Cellulitis of trunk, unspecified: Secondary | ICD-10-CM | POA: Diagnosis not present

## 2022-10-04 DIAGNOSIS — T80212A Local infection due to central venous catheter, initial encounter: Secondary | ICD-10-CM | POA: Diagnosis not present

## 2022-10-04 DIAGNOSIS — E1169 Type 2 diabetes mellitus with other specified complication: Secondary | ICD-10-CM | POA: Diagnosis not present

## 2022-10-04 LAB — CULTURE, BLOOD (ROUTINE X 2)

## 2022-10-04 NOTE — Transitions of Care (Post Inpatient/ED Visit) (Signed)
10/04/2022  Name: Savannah Burns MRN: [id number] DOB: [birth year]  Today's TOC FU Call Status: Today's TOC FU Call Status:: Successful TOC FU Call Competed TOC FU Call Complete Date: 10/04/22  Transition Care Management Follow-up Telephone Call Date of Discharge: 10/04/22 [facility] Type of Discharge: Inpatient Admission Primary Inpatient Discharge Diagnosis:: Sepsis How have you been since you were released from the hospital?: Better (Per patient's granddaughter, she is better, glad to be home) Any questions or concerns?: No  Items Reviewed: Did you receive and understand the discharge instructions provided?: Yes Medications obtained,verified, and reconciled?: Yes (Medications Reviewed) Any new allergies since your discharge?: No Dietary orders reviewed?: No Do you have support at home?: Yes People in Home: grandchild(ren) Name of Support/Comfort Primary Source: Trystan  Medications Reviewed Today: Medications Reviewed Today     Reviewed by Jodelle Gross, RN (Case Manager) on 10/04/22 at 1525  Med List Status: <None>   Medication Order Taking? Sig Documenting Provider Last Dose Status Informant  acyclovir (ZOVIRAX) 400 MG tablet [id number] No Take 1 tablet (400 mg total) by mouth 2 (two) times daily.  Patient not taking: Reported on 09/27/2022   Doreatha Massed, MD Not Taking Active Self, Pharmacy Records    Discontinued 09/22/16 1818   baclofen (LIORESAL) 10 MG tablet [id number] Yes Take 10 mg by mouth 2 (two) times daily. [provider] Taking Active Self, Pharmacy Records  Blood Glucose Calibration (TRUE METRIX LEVEL 1) Low SOLN [id number] Yes Use w/ glucose monitor Dx E11.9 Raliegh Ip, DO Taking Active Self, Pharmacy Records  Blood Glucose Monitoring Suppl (TRUE METRIX AIR GLUCOSE METER) w/Device KIT [id number] Yes Check BS daily Dx E11.9 Raliegh Ip, DO Taking Active Self, Pharmacy Records  ceFAZolin (ANCEF) 2-4  GM/100ML-% IVPB [id number] Yes Inject 100 mLs (2 g total) into the vein every 8 (eight) hours for 73 doses. Vassie Loll, MD Taking Active   ceFAZolin (ANCEF) IVPB [id number] Yes Inject 2 g into the vein every 8 (eight) hours for 23 days. Indication:  Bacteremia First Dose: No Last Day of Therapy:  10/26/22 Labs - Once weekly:  CBC/D and BMP, Labs - Once weekly: ESR and CRP Method of administration: IV Push Method of administration may be changed at the discretion of home infusion pharmacist based upon assessment of the patient and/or caregiver's ability to self-administer the medication ordered. Vassie Loll, MD Taking Active            Med Note Electa Sniff, [facility]   Tue Oct 04, 2022  3:24 PM) Verified [facility] managing IV and teaching of family  Cholecalciferol (VITAMIN D) 50 MCG (2000 UT) tablet [id number] Yes Take 2,000 Units by mouth daily. [provider] Taking Active Self, Pharmacy Records  clobetasol (TEMOVATE) 0.05 % external solution [id number] Yes Apply 1 Application topically 2 (two) times daily. [provider] Taking Active Self, Pharmacy Records  Cyanocobalamin (B-12 COMPLIANCE INJECTION) 1000 MCG/ML KIT [id number] Yes Inject 1,000 mcg as directed every 30 (thirty) days. [provider] Taking Active Self, Pharmacy Records  dexamethasone (DECADRON) 4 MG tablet [id number] Yes Take 5 tablets (20 mg total) by mouth once a week. Doreatha Massed, MD Taking Active Self, Pharmacy Records  esomeprazole (NEXIUM) 40 MG capsule [id number] Yes Take 1 capsule (40 mg total) by mouth daily. Vassie Loll, MD Taking Active   ferrous sulfate 325 (65 FE) MG tablet [id number]  Take 1 tablet (325 mg total) by mouth daily with breakfast. Doreatha Massed, MD  Expired 09/27/22 2359 Self,  Pharmacy Records  glucose blood (TRUE METRIX BLOOD GLUCOSE TEST) test strip [id number] Yes Check BS daily Dx E11.9 Delynn Flavin M, DO Taking Active Self, Pharmacy Records  lenalidomide  (REVLIMID) 15 MG capsule [id number] Yes TAKE 1 CAPSULE BY MOUTH EVERY DAY FOR 21 DAYS ON THEN 7 DAYS OFF  Patient taking differently: Take 15 mg by mouth See admin instructions. TAKE 1 CAPSULE BY MOUTH EVERY DAY FOR 21 DAYS ON THEN 7 DAYS OFF   Doreatha Massed, MD Taking Active Self, Pharmacy Records  lidocaine-prilocaine (EMLA) cream [id number]  Apply 1 Application topically as needed. Apply a small amount to port a cath site and cover with plastic wrap 1 hour prior to infusion appointments Doreatha Massed, MD  Active Self, Pharmacy Records  lovastatin (MEVACOR) 20 MG tablet [id number] No Take 1 tablet (20 mg total) by mouth at bedtime.  Patient not taking: Reported on 09/27/2022   Raliegh Ip, DO Not Taking Active Self, Pharmacy Records  nystatin (MYCOSTATIN/NYSTOP) powder [id number] Yes Apply topically 2 (two) times daily. Vassie Loll, MD Taking Active   oxyCODONE-acetaminophen (PERCOCET) 10-325 MG tablet [id number]  Take 1 tablet by mouth every 6 (six) hours as needed for pain. [provider]  Active Self, Pharmacy Records  OXYGEN [id number] Yes Inhale 3 L into the lungs continuous. [provider] Taking Active Self, Pharmacy Records           Med Note [place], AMY E   Tue Jul 27, 2021  9:08 AM) Now using 5lpm when walking  OZEMPIC, 0.25 OR 0.5 MG/DOSE, 2 MG/3ML SOPN [id number]  Inject 2 mg into the skin once a week. [provider]  Active Self, Pharmacy Records  potassium chloride SA (KLOR-CON) 20 MEQ tablet [id number] Yes Take 2 tablets (40 mEq total) by mouth 2 (two) times daily.  Patient taking differently: Take 40 mEq by mouth daily.   Raliegh Ip, DO Taking Active Self, Pharmacy Records  prochlorperazine (COMPAZINE) 10 MG tablet [id number] No Take 1 tablet (10 mg total) by mouth every 6 (six) hours as needed for nausea or vomiting.  Patient not taking: Reported on 09/27/2022   Doreatha Massed, MD Not Taking Active Self, Pharmacy Records   torsemide [facility]) 20 MG tablet [id number] Yes Take 2.5 tablets (50 mg total) by mouth 2 (two) times daily. Vassie Loll, MD Taking Active   TRUEplus Lancets 33G MISC [id number] Yes Check BS daily Dx E11.9 Raliegh Ip, DO Taking Active Self, Pharmacy Records  venlafaxine XR [facility]) 150 MG 24 hr capsule [id number] Yes Take 1 capsule (150 mg total) by mouth daily with breakfast. Vassie Loll, MD Taking Active   Med List Note Remi Haggard, RPH-CPP 12/03/21 1141): Revlimid filled at [facility]            Home Care and Equipment/Supplies: Were Home Health Services Ordered?: Yes Name of Home Health Agency:: [facility] Has Agency set up a time to come to your home?: Yes First Home Health Visit Date: 10/04/22 Any new equipment or medical supplies ordered?: Yes Name of Medical supply agency?: [facility] Were you able to get the equipment/medical supplies?: Yes Do you have any questions related to the use of the equipment/supplies?: No  Functional Questionnaire: Do you need assistance with bathing/showering or dressing?: Yes Do you need assistance with meal preparation?: Yes Do you need assistance with eating?: No Do you have difficulty maintaining continence: No Do you need assistance with getting out of bed/getting out of a chair/moving?: No Do  you have difficulty managing or taking your medications?: Yes  Follow up appointments reviewed: PCP Follow-up appointment confirmed?: No MD Provider Line Number:[phone] Given: No (Patient's grand-daughter to call) Specialist Hospital Follow-up appointment confirmed?: Yes Date of Specialist follow-up appointment?: 10/10/22 Follow-Up Specialty Provider:: Dr. Ellin Saba Do you need transportation to your follow-up appointment?: No Do you understand care options if your condition(s) worsen?: Yes-patient verbalized understanding  SDOH Interventions Today    Flowsheet Row Most Recent Value   SDOH Interventions   Housing Interventions Intervention Not Indicated  Transportation Interventions Intervention Not Indicated      Interventions Today    Flowsheet Row Most Recent Value  Chronic Disease   Chronic disease during today's visit Other  [Sepsis]  General Interventions   General Interventions Discussed/Reviewed General Interventions Discussed, Referral to Nurse       [facility] Inter[facility] Today    Flowsheet Row Most Recent Value  TOC Interventions   TOC Interventions Discussed/Reviewed TOC Interventions Discussed, TOC Interventions Reviewed       Jodelle Gross, RN, BSN, CCM Care Management Coordinator [facility] Phone: [phone]/Fax: [phone]

## 2022-10-04 NOTE — Chronic Care Management (AMB) (Signed)
   10/04/2022  Savannah Burns 09-21-1950 [id number]   Reason for Encounter: Patient is not currently enrolled in the CCM program. CCM status changed to previously enrolled  Alto Denver RN, MSN, CCM RN Care Manager  Chronic Care Management Direct Number: [phone]

## 2022-10-05 LAB — LAB REPORT - SCANNED: EGFR: 96

## 2022-10-07 ENCOUNTER — Other Ambulatory Visit: Payer: Self-pay | Admitting: Family Medicine

## 2022-10-09 NOTE — Progress Notes (Signed)
[facility] [address], [place] [address]    Clinic Day:  10/10/22   Referring physician: Raliegh Ip, DO  Patient Care Team: Raliegh Ip, DO as PCP - General (Family Medicine) Wyline Mood Dorothe Pea, MD as PCP - Cardiology (Cardiology) Letta Kocher, MD (Rehabilitation) Coralyn Helling, MD as Consulting Physician (Pulmonary Disease) Lanelle Bal, DO as Consulting Physician (Internal Medicine) Merryl Hacker, NP as Nurse Practitioner (Nurse Practitioner) Bonna Gains, MD as Referring Physician (Gastroenterology) Doreatha Massed, MD as Medical Oncologist (Medical Oncology) Therese Sarah, RN as Oncology Nurse Navigator (Medical Oncology)   ASSESSMENT & PLAN:   Assessment: 1. 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  Orderly maturation of erythroid and myeloid series. - Myeloma FISH panel: t(11;14) - Cytogenetics: Failed to grow metaphases - PET scan (11/18/2021): No evidence of active myeloma or plasmacytoma.  Right upper lobe lung infection. - Skull x-rays (10/29/2021) multiple discrete round lucent/lytic lesions in the skull - Worsening M spike and free light chain ratio.  Normal creatinine and calcium.  Multifactorial anemia including blood loss and bone marrow infiltration. - Cycle 1 of DRd started on 12/14/2021 with lenalidomide 15 mg 3 weeks on/1 week off and dexamethasone 20 mg weekly - Hospitalized from 09/26/2022 through 10/03/2022 with MSSA bacteremia, port removed, cefepime started, to continue until 10/26/2022   2.  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  Quit smoking 15 years ago.  No exposure to chemicals or pesticides. - Paternal grandmother had ovarian cancer.  Sister had ovarian cancer.   3.  Cirrhosis: - Likely secondary to NAFLD with splenomegaly resulting in mild  thrombocytopenia.  Seen by transplant hepatology service at [facility].    Plan: 1.  Stage II IgG kappa myeloma, standard risk: - Last treatment with Darzalex was on 08/30/2022. - He was admitted to the hospital on 09/26/2022 with fever and found to have MSSA bacteremia. - I have reviewed hospitalization records. - Blood cultures from 09/26/2022 showed Staph aureus, sensitive to all antibiotics except ciprofloxacin. - Continue IV cephalosporin 3 times daily.  She has PICC line on the left arm.  Port was removed. - Reviewed myeloma labs from 1 month ago which showed M spike is stable at 0.3 g. - Labs today: White count 3.8, normal ANC.  Platelet count is 103.  Renal function and hepatic function is within normal limits. - I will continue to hold Revlimid, dexamethasone and Darzalex.  I will reevaluate her on 10/26/2022 to start treatment.   2.  Iron deficiency anemia from chronic GI bleed: - Continue Venofer 500 mg every 2 weeks.  Hemoglobin today is 9.5.   3.  Infection prophylaxis: - Continue acyclovir 4 mg twice daily.  Aspirin on hold due to GI bleed.   4.  Cirrhosis: - Continue Lasix 20 mg daily, carvedilol 6.5 mg daily and lactulose daily.   5.  Peripheral neuropathy: - She feels like walking on tiny pillows which is stable.    No orders of the defined types were placed in this encounter.      Alben Deeds Teague,acting as a Neurosurgeon for Doreatha Massed, MD.,have documented all relevant documentation on the behalf of Doreatha Massed, MD,as directed by  Doreatha Massed, MD while in the presence of Doreatha Massed, MD.  I, Doreatha Massed MD, have reviewed the above documentation for accuracy and completeness, and  I agree with the above.    Doreatha Massed, MD   7/15/20249:34 AM  CHIEF COMPLAINT:   Diagnosis: stage II standard risk IgG kappa plasma cell myeloma    Cancer Staging  Multiple myeloma (HCC) Staging form: Plasma Cell Myeloma and Plasma Cell  Disorders, AJCC 8th Edition - Clinical stage from 11/30/2021: RISS Stage II (Beta-2-microglobulin (mg/L): 2.8, Albumin (g/dL): 3, ISS: Stage II, High-risk cytogenetics: Absent, LDH: Normal) - Signed by Doreatha Massed, MD on 11/30/2021    Prior Therapy: none  Current Therapy:  Daratumumab, lenalidomide and dexamethasone    HISTORY OF PRESENT ILLNESS:   Oncology History  Multiple myeloma (HCC)  11/30/2021 Initial Diagnosis   Multiple myeloma (HCC)   11/30/2021 Cancer Staging   Staging form: Plasma Cell Myeloma and Plasma Cell Disorders, AJCC 8th Edition - Clinical stage from 11/30/2021: RISS Stage II (Beta-2-microglobulin (mg/L): 2.8, Albumin (g/dL): 3, ISS: Stage II, High-risk cytogenetics: Absent, LDH: Normal) - Signed by Doreatha Massed, MD on 11/30/2021 Histopathologic type: Multiple myeloma Stage prefix: Initial diagnosis Beta 2 microglobulin range (mg/L): Less than 3.5 Albumin range (g/dL): Less than 3.5 Cytogenetics: t(11;14) translocation Serum calcium level: Normal   12/14/2021 -  Chemotherapy   Patient is on Treatment Plan : MYELOMA  Daratumumab SQ + Lenalidomide + Dexamethasone (DaraRd) q28d        INTERVAL HISTORY:   Savannah Burns is a 72 y.o. female presenting to clinic today for follow up of stage II standard risk IgG kappa plasma cell myeloma. She was last seen by me on 09/13/22.  Since her last visit, she was seen in the ED on 7/1 for a working diagnosis of skin abscess complicated with MSSA bacteremia complicated with sepsis that produced a fever and tachycardia. Blood cultures throughout her hospital stay showed no growth of MSSA. She was given 4 weeks of IV antibiotic therapy with cefazolin on 7/7 and was discharged home on cefepime on 7/8.   Today, she states that she is doing well overall. Her appetite level is at 100%. Her energy level is at 0%.  She has discontinued Revlimid treatment until she is off her antibiotics. She reports of sleeping often.   She c/o pain  in the upper back beside the bilateral shoulder blades upon palpation and taking deep breaths. She denies diarrhea since starting antibiotics.   She denies taking steroids since her hospital stay, though she is still on lasix.   PAST MEDICAL HISTORY:   Past Medical History: Past Medical History:  Diagnosis Date   Adrenal adenoma, left    Stable   Anxiety    Arthritis    bilateral hands   Depression    Diabetes mellitus, type 2 (HCC) 08/12/2008   Qualifier: Diagnosis of  By: Dayton Martes MD, Talia     Dyspnea    Esophageal varices (HCC)    Grade II diastolic dysfunction    History of kidney stones    Hyperlipidemia    Hypertension    Lower back pain    Lower GI bleed 03/19/2020   Panic attacks    Pneumonia    currently taking antibiotic and prednisone for early stages of pneumonia   Pulmonary nodules    bilateral   Skin cancer    face    Surgical History: Past Surgical History:  Procedure Laterality Date   BIOPSY  04/07/2020   Procedure: BIOPSY;  Surgeon: Lanelle Bal, DO;  [facility];  Service: Endoscopy;;   Breast Cystectomy  Right    CESAREAN SECTION  COLONOSCOPY WITH PROPOFOL N/A 01/25/2020   Dr. Marletta Lor: Nonbleeding internal hemorrhoids, diverticulosis, 5 mm polyp removed from the ascending colon, 10 mm polyp removed from the sigmoid colon, 30 mm polyp (tubulovillous adenoma with no high-grade dysplasia) removed from the transverse colon via piecemeal status post tattoo.  Other polyps were tubular adenomas.  3 month surveillance colonoscopy recommended.   COLONOSCOPY WITH PROPOFOL N/A 04/07/2020   Procedure: COLONOSCOPY WITH PROPOFOL;  Surgeon: Lanelle Bal, DO;  [facility]: [facility];  Service: Endoscopy;  Laterality: N/A;  3:00pm, pt knows new time per office   CYSTOSCOPY/URETEROSCOPY/HOLMIUM LASER/STENT PLACEMENT Bilateral 03/01/2019   Procedure: CYSTOSCOPY/RETROGRADEURETEROSCOPY/HOLMIUM LASER/STENT PLACEMENT;  Surgeon: Rene Paci,  MD;  Location: [facility];  Service: Urology;  Laterality: Bilateral;  ONLY NEEDS 60 MIN   ESOPHAGOGASTRODUODENOSCOPY (EGD) WITH PROPOFOL N/A 01/25/2020   Dr. Marletta Lor: 4 columns grade 1 esophageal varices   ESOPHAGOGASTRODUODENOSCOPY (EGD) WITH PROPOFOL N/A 05/18/2020   Procedure: ESOPHAGOGASTRODUODENOSCOPY (EGD) WITH PROPOFOL;  Surgeon: Lanelle Bal, DO;  [facility];  Service: Endoscopy;  Laterality: N/A;   ESOPHAGOGASTRODUODENOSCOPY (EGD) WITH PROPOFOL N/A 07/28/2020   Procedure: ESOPHAGOGASTRODUODENOSCOPY (EGD) WITH PROPOFOL;  Surgeon: Lanelle Bal, DO;  [facility];  Service: Endoscopy;  Laterality: N/A;  am or early PM due to givens capsule placement   GIVENS CAPSULE STUDY N/A 05/18/2020   Procedure: GIVENS CAPSULE STUDY;  Surgeon: Dolores Frame, MD;  [facility]: [facility];  Service: Gastroenterology;  Laterality: N/A;   GIVENS CAPSULE STUDY N/A 07/28/2020   Procedure: GIVENS CAPSULE STUDY;  Surgeon: Lanelle Bal, DO;  [facility]: [facility];  Service: Endoscopy;  Laterality: N/A;   IR IMAGING GUIDED PORT INSERTION  12/02/2021   IRRIGATION AND DEBRIDEMENT ABSCESS Right 09/28/2022   Procedure: IRRIGATION AND DEBRIDEMENT ABSCESS;  Surgeon: Lewie Chamber, DO;  Location: [facility];  Service: General;  Laterality: Right;   POLYPECTOMY  01/25/2020   Procedure: POLYPECTOMY;  Surgeon: Lanelle Bal, DO;  [facility];  Service: Endoscopy;;   POLYPECTOMY  04/07/2020   Procedure: POLYPECTOMY INTESTINAL;  Surgeon: Lanelle Bal, DO;  [facility];  Service: Endoscopy;;   PORT-A-CATH REMOVAL Right 09/28/2022   Procedure: MINOR REMOVAL PORT-A-CATH;  Surgeon: Lewie Chamber, DO;  Location: [facility];  Service: General;  Laterality: Right;   SKIN CANCER EXCISION     Face   SPINE SURGERY     SUBMUCOSAL TATTOO INJECTION  01/25/2020   Procedure: SUBMUCOSAL TATTOO INJECTION;  Surgeon: Lanelle Bal, DO;  [facility];  Service: Endoscopy;;    Social History: Social History   Socioeconomic History   Marital status: Widowed    Spouse name: Not on file   Number of children: 2   Years of education: 14   Highest education level: Not on file  Occupational History   Occupation: Retired   Tobacco Use   Smoking status: Former    Current packs/day: 0.00    Average packs/day: 1.5 packs/day for 40.0 years (60.0 ttl pk-yrs)    Types: Cigarettes    Start date: 04/29/1975    Quit date: 04/29/2015    Years since quitting: 7.4   Smokeless tobacco: Never   Tobacco comments:    Quit smoking 04/2015- Previous 1.5 ppd smoker  Vaping Use   Vaping status: Never Used  Substance and Sexual Activity   Alcohol use: No    Alcohol/week: 0.0 standard drinks of alcohol   Drug use: No  Sexual activity: Not Currently    Birth control/protection: Post-menopausal  Other Topics Concern   Not on file  Social History Narrative   Her 56 year old granddaughter lives with her - one daughter lives nearby, but she doesn't have a good relationship with her. Has a great relationship with other daughter who lives 1.5 hrs away - talks to her daily on the phone.   Social Determinants of Health   Financial Resource Strain: Medium Risk (07/27/2021)   Overall Financial Resource Strain (CARDIA)    Difficulty of Paying Living Expenses: Somewhat hard  Food Insecurity: Food Insecurity Present (09/26/2022)   Hunger Vital Sign    Worried About Running Out of Food in the Last Year: Sometimes true    Ran Out of Food in the Last Year: Never true  Transportation Needs: No Transportation Needs (10/04/2022)   PRAPARE - Administrator, Civil Service (Medical): No    Lack of Transportation (Non-Medical): No  Physical Activity: Inactive (07/27/2021)   Exercise Vital Sign    Days of Exercise per Week: 0 days    Minutes of Exercise per Session: 0 min  Stress: Stress Concern Present (07/27/2021)   [employer] of Occupational Health  - Occupational Stress Questionnaire    Feeling of Stress : To some extent  Social Connections: Socially Isolated (07/27/2021)   Social Connection and Isolation Panel [NHANES]    Frequency of Communication with Friends and Family: More than three times a week    Frequency of Social Gatherings with Friends and Family: Once a week    Attends Religious Services: Never    Database administrator or Organizations: No    Attends Banker Meetings: Never    Marital Status: Widowed  Intimate Partner Violence: Not At Risk (09/26/2022)   Humiliation, Afraid, Rape, and Kick questionnaire    Fear of Current or Ex-Partner: No    Emotionally Abused: No    Physically Abused: No    Sexually Abused: No    Family History: Family History  Problem Relation Age of Onset   Diabetes Father    Heart disease Father 100       MI   Hypertension Father    Anemia Mother        Transfusion dependent   COPD Sister    Cancer Paternal Grandmother 51       Pancreatic    Current Medications:  Current Outpatient Medications:    acyclovir (ZOVIRAX) 400 MG tablet, Take 1 tablet (400 mg total) by mouth 2 (two) times daily., Disp: 60 tablet, Rfl: 6   baclofen (LIORESAL) 10 MG tablet, Take 10 mg by mouth 2 (two) times daily., Disp: , Rfl:    Blood Glucose Calibration (TRUE METRIX LEVEL 1) Low SOLN, Use w/ glucose monitor Dx E11.9, Disp: 3 each, Rfl: 0   Blood Glucose Monitoring Suppl (TRUE METRIX AIR GLUCOSE METER) w/Device KIT, Check BS daily Dx E11.9, Disp: 1 kit, Rfl: 0   ceFAZolin (ANCEF) 2-4 GM/100ML-% IVPB, Inject 100 mLs (2 g total) into the vein every 8 (eight) hours for 73 doses., Disp: 7300 mL, Rfl: 0   ceFAZolin (ANCEF) IVPB, Inject 2 g into the vein every 8 (eight) hours for 23 days. Indication:  Bacteremia First Dose: No Last Day of Therapy:  10/26/22 Labs - Once weekly:  CBC/D and BMP, Labs - Once weekly: ESR and CRP Method of administration: IV Push Method of administration may be changed at the  discretion of home infusion  pharmacist based upon assessment of the patient and/or caregiver's ability to self-administer the medication ordered., Disp: 69 Units, Rfl: 0   Cholecalciferol (VITAMIN D) 50 MCG (2000 UT) tablet, Take 2,000 Units by mouth daily., Disp: , Rfl:    clobetasol (TEMOVATE) 0.05 % external solution, Apply 1 Application topically 2 (two) times daily., Disp: , Rfl:    Cyanocobalamin (B-12 COMPLIANCE INJECTION) 1000 MCG/ML KIT, Inject 1,000 mcg as directed every 30 (thirty) days., Disp: , Rfl:    dexamethasone (DECADRON) 4 MG tablet, Take 5 tablets (20 mg total) by mouth once a week., Disp: 20 tablet, Rfl: 3   esomeprazole (NEXIUM) 40 MG capsule, Take 1 capsule (40 mg total) by mouth daily., Disp: 30 capsule, Rfl: 2   glucose blood (TRUE METRIX BLOOD GLUCOSE TEST) test strip, Check BS daily Dx E11.9, Disp: 100 each, Rfl: 3   lenalidomide (REVLIMID) 15 MG capsule, TAKE 1 CAPSULE BY MOUTH EVERY DAY FOR 21 DAYS ON THEN 7 DAYS OFF (Patient taking differently: Take 15 mg by mouth See admin instructions. TAKE 1 CAPSULE BY MOUTH EVERY DAY FOR 21 DAYS ON THEN 7 DAYS OFF), Disp: 21 capsule, Rfl: 0   lidocaine-prilocaine (EMLA) cream, Apply 1 Application topically as needed. Apply a small amount to port a cath site and cover with plastic wrap 1 hour prior to infusion appointments, Disp: 30 g, Rfl: 3   lovastatin (MEVACOR) 20 MG tablet, Take 1 tablet (20 mg total) by mouth at bedtime., Disp: 90 tablet, Rfl: 3   nystatin (MYCOSTATIN/NYSTOP) powder, Apply topically 2 (two) times daily., Disp: 15 g, Rfl: 0   oxyCODONE-acetaminophen (PERCOCET) 10-325 MG tablet, Take 1 tablet by mouth every 6 (six) hours as needed for pain., Disp: , Rfl:    OXYGEN, Inhale 3 L into the lungs continuous., Disp: , Rfl:    OZEMPIC, 0.25 OR 0.5 MG/DOSE, 2 MG/3ML SOPN, Inject 2 mg into the skin once a week., Disp: , Rfl:    potassium chloride SA (KLOR-CON) 20 MEQ tablet, Take 2 tablets (40 mEq total) by mouth 2 (two)  times daily. (Patient taking differently: Take 40 mEq by mouth daily.), Disp: 360 tablet, Rfl: 3   prochlorperazine (COMPAZINE) 10 MG tablet, Take 1 tablet (10 mg total) by mouth every 6 (six) hours as needed for nausea or vomiting., Disp: 30 tablet, Rfl: 6   torsemide (DEMADEX) 20 MG tablet, Take 2.5 tablets (50 mg total) by mouth 2 (two) times daily., Disp: , Rfl:    TRUEplus Lancets 33G MISC, Check BS daily Dx E11.9, Disp: 100 each, Rfl: 3   venlafaxine XR (EFFEXOR-XR) 150 MG 24 hr capsule, Take 1 capsule (150 mg total) by mouth daily with breakfast., Disp: 30 capsule, Rfl: 2   ferrous sulfate 325 (65 FE) MG tablet, Take 1 tablet (325 mg total) by mouth daily with breakfast., Disp: 30 tablet, Rfl: 3 No current facility-administered medications for this visit.  Facility-Administered Medications Ordered in Other Visits:    cyanocobalamin (VITAMIN B12) injection 1,000 mcg, 1,000 mcg, Intramuscular, Once, Doreatha Massed, MD   heparin lock flush 100 unit/mL, 250 Units, Intracatheter, Once PRN, Doreatha Massed, MD   iron sucrose (VENOFER) 500 mg in sodium chloride 0.9 % 250 mL IVPB, 500 mg, Intravenous, Once, Doreatha Massed, MD   loratadine (CLARITIN) tablet 10 mg, 10 mg, Oral, Once, Doreatha Massed, MD   sodium chloride flush (NS) 0.9 % injection 10 mL, 10 mL, Intravenous, PRN, Pennington, Rebekah M, PA-C, 10 mL at 03/08/22 1344   sodium chloride  flush (NS) 0.9 % injection 10 mL, 10 mL, Intracatheter, Once PRN, Doreatha Massed, MD   Allergies: Allergies  Allergen Reactions   Keflex [Cephalexin] Nausea And Vomiting    REVIEW OF SYSTEMS:   Review of Systems  Constitutional:  Negative for chills, fatigue and fever.  HENT:   Negative for lump/mass, mouth sores, nosebleeds, sore throat and trouble swallowing.   Eyes:  Negative for eye problems.  Respiratory:  Positive for cough (since picc line on 7/8). Negative for shortness of breath.   Cardiovascular:  Negative for  chest pain, leg swelling and palpitations.  Gastrointestinal:  Negative for abdominal pain, constipation, diarrhea, nausea and vomiting.  Genitourinary:  Negative for bladder incontinence, difficulty urinating, dysuria, frequency, hematuria and nocturia.   Musculoskeletal:  Positive for arthralgias (burning below bilateral shoulder blades). Negative for back pain, flank pain, myalgias and neck pain.  Skin:  Negative for itching and rash.  Neurological:  Positive for headaches (since picc line on 7/8). Negative for dizziness and numbness.  Hematological:  Does not bruise/bleed easily.  Psychiatric/Behavioral:  Negative for depression, sleep disturbance and suicidal ideas. The patient is not nervous/anxious.   All other systems reviewed and are negative.    VITALS:   There were no vitals taken for this visit.  Wt Readings from Last 3 Encounters:  10/10/22 277 lb 6.4 oz (125.8 kg)  09/26/22 280 lb (127 kg)  09/13/22 279 lb (126.6 kg)    There is no height or weight on file to calculate BMI.  Performance status (ECOG): 1 - Symptomatic but completely ambulatory  PHYSICAL EXAM:   Physical Exam Vitals and nursing note reviewed. Exam conducted with a chaperone present.  Constitutional:      Appearance: Normal appearance.  Cardiovascular:     Rate and Rhythm: Normal rate and regular rhythm.     Pulses: Normal pulses.     Heart sounds: Normal heart sounds.  Pulmonary:     Effort: Pulmonary effort is normal.     Breath sounds: Normal breath sounds.  Abdominal:     Palpations: Abdomen is soft. There is no hepatomegaly, splenomegaly or mass.     Tenderness: There is no abdominal tenderness.  Musculoskeletal:     Right lower leg: No edema.     Left lower leg: No edema.  Lymphadenopathy:     Cervical: No cervical adenopathy.     Right cervical: No superficial, deep or posterior cervical adenopathy.    Left cervical: No superficial, deep or posterior cervical adenopathy.     Upper Body:      Right upper body: No supraclavicular or axillary adenopathy.     Left upper body: No supraclavicular or axillary adenopathy.  Neurological:     General: No focal deficit present.     Mental Status: She is alert and oriented to person, place, and time.  Psychiatric:        Mood and Affect: Mood normal.        Behavior: Behavior normal.     LABS:      Latest Ref Rng & Units 10/10/2022    8:19 AM 10/01/2022    5:00 AM 09/30/2022    5:08 AM  CBC  WBC 4.0 - 10.5 K/uL 3.8  2.8  2.8   Hemoglobin 12.0 - 15.0 g/dL 9.5  8.8  9.3   Hematocrit 36.0 - 46.0 % 30.8  28.3  29.9   Platelets 150 - 400 K/uL 103  93  83  Latest Ref Rng & Units 10/10/2022    8:19 AM 10/03/2022    4:14 AM 10/01/2022    5:00 AM  CMP  Glucose 70 - 99 mg/dL 161   096   BUN 8 - 23 mg/dL 14   13   Creatinine 0.45 - 1.00 mg/dL 4.09  8.11  9.14   Sodium 135 - 145 mmol/L 139   137   Potassium 3.5 - 5.1 mmol/L 3.6   3.6   Chloride 98 - 111 mmol/L 104   101   CO2 22 - 32 mmol/L 27   31   Calcium 8.9 - 10.3 mg/dL 8.3   8.0   Total Protein 6.5 - 8.1 g/dL 5.8     Total Bilirubin 0.3 - 1.2 mg/dL 0.3     Alkaline Phos 38 - 126 U/L 98     AST 15 - 41 U/L 23     ALT 0 - 44 U/L 12        No results found for: "CEA1", "CEA" / No results found for: "CEA1", "CEA" No results found for: "PSA1" No results found for: "CAN199" No results found for: "CAN125"  Lab Results  Component Value Date   TOTALPROTELP 5.1 (L) 08/30/2022   ALBUMINELP 2.9 08/30/2022   A1GS 0.3 08/30/2022   A2GS 0.6 08/30/2022   BETS 0.7 08/30/2022   GAMS 0.6 08/30/2022   MSPIKE 0.3 (H) 08/30/2022   SPEI Comment 08/30/2022   Lab Results  Component Value Date   TIBC 338 08/02/2022   TIBC 375 06/07/2022   TIBC 425 03/31/2022   FERRITIN 81 08/02/2022   FERRITIN 51 06/07/2022   FERRITIN 66 03/31/2022   IRONPCTSAT 13 08/02/2022   IRONPCTSAT 27 06/07/2022   IRONPCTSAT 9 (L) 03/31/2022   Lab Results  Component Value Date   LDH 92 (L) 10/22/2021    LDH 101 06/25/2021   LDH 103 03/24/2021     STUDIES:   [place] EKG SITE RITE  Result Date: 10/01/2022 If Site Rite image not attached, placement could not be confirmed due to current cardiac rhythm.  ECHOCARDIOGRAM COMPLETE  Result Date: 09/27/2022    ECHOCARDIOGRAM REPORT   Patient Name:   LANDYN LORINCZ Date of Exam: 09/27/2022 Medical Rec #:  [id number]          Height:       62.0 in Accession #:    [id number]         Weight:       280.0 lb Date of Birth:  02-Mar-1951          BSA:          2.206 m Patient Age:    72 years           BP:           104/54 mmHg Patient Gender: F                  HR:           76 bpm. [facility] Procedure: 2D Echo, Cardiac Doppler and Color Doppler Indications:    Bacteremia R78.81  History:        Patient has prior history of Echocardiogram examinations, most                 recent 06/03/2022. Sepsis; Risk Factors:Dyslipidemia, Former                 Smoker, Hypertension and Diabetes.  Sonographer:  Aron Baba Referring Phys: [id number] CORNELIUS N VAN DAM  Sonographer Comments: Patient is obese. Image acquisition challenging due to respiratory motion. IMPRESSIONS  1. Left ventricular ejection fraction, by estimation, is 65 to 70%. The left ventricle has normal function. The left ventricle has no regional wall motion abnormalities. There is mild left ventricular hypertrophy. Left ventricular diastolic parameters are consistent with Grade I diastolic dysfunction (impaired relaxation). Elevated left atrial pressure.  2. Right ventricular systolic function is normal. The right ventricular size is normal. There is mildly elevated pulmonary artery systolic pressure.  3. Left atrial size was mildly dilated.  4. Right atrial size was mildly dilated.  5. The mitral valve is abnormal. Mild mitral valve regurgitation.  6. The aortic valve has an indeterminant number of cusps. There is moderate calcification of the aortic valve. There is moderate thickening of the aortic  valve. Aortic valve regurgitation is not visualized. Mild aortic valve stenosis. Aortic valve mean gradient measures 18.0 mmHg. Aortic valve peak gradient measures 35.3 mmHg. Aortic valve area, by VTI measures 1.87 cm.  7. Aortic dilatation noted. There is mild dilatation of the ascending aorta, measuring 40 mm.  8. The inferior vena cava is dilated in size with >50% respiratory variability, suggesting right atrial pressure of 8 mmHg. FINDINGS  Left Ventricle: Left ventricular ejection fraction, by estimation, is 65 to 70%. The left ventricle has normal function. The left ventricle has no regional wall motion abnormalities. The left ventricular internal cavity size was normal in size. There is  mild left ventricular hypertrophy. Left ventricular diastolic parameters are consistent with Grade I diastolic dysfunction (impaired relaxation). Elevated left atrial pressure. Right Ventricle: The right ventricular size is normal. Right vetricular wall thickness was not well visualized. Right ventricular systolic function is normal. There is mildly elevated pulmonary artery systolic pressure. The tricuspid regurgitant velocity  is 2.41 m/s, and with an assumed right atrial pressure of 15 mmHg, the estimated right ventricular systolic pressure is 38.2 mmHg. Left Atrium: Left atrial size was mildly dilated. Right Atrium: Right atrial size was mildly dilated. Pericardium: There is no evidence of pericardial effusion. Mitral Valve: The mitral valve is abnormal. There is mild thickening of the mitral valve leaflet(s). There is mild calcification of the mitral valve leaflet(s). Mild mitral annular calcification. Mild mitral valve regurgitation. Tricuspid Valve: The tricuspid valve is normal in structure. Tricuspid valve regurgitation is not demonstrated. No evidence of tricuspid stenosis. Aortic Valve: The aortic valve has an indeterminant number of cusps. There is moderate calcification of the aortic valve. There is moderate  thickening of the aortic valve. There is moderate aortic valve annular calcification. Aortic valve regurgitation is not visualized. Mild aortic stenosis is present. Aortic valve mean gradient measures 18.0 mmHg. Aortic valve peak gradient measures 35.3 mmHg. Aortic valve area, by VTI measures 1.87 cm. Pulmonic Valve: The pulmonic valve was not well visualized. Pulmonic valve regurgitation is not visualized. No evidence of pulmonic stenosis. Aorta: The aortic root is normal in size and structure and aortic dilatation noted. There is mild dilatation of the ascending aorta, measuring 40 mm. Venous: The inferior vena cava is dilated in size with greater than 50% respiratory variability, suggesting right atrial pressure of 8 mmHg. IAS/Shunts: The interatrial septum was not well visualized.  LEFT VENTRICLE PLAX 2D LVIDd:         4.40 cm   Diastology LVIDs:         3.20 cm   LV e' medial:    5.70 cm/s LV  PW:         1.10 cm   LV E/e' medial:  20.7 LV IVS:        1.10 cm   LV e' lateral:   9.81 cm/s LVOT diam:     2.10 cm   LV E/e' lateral: 12.0 LV SV:         108 LV SV Index:   49 LVOT Area:     3.46 cm  RIGHT VENTRICLE RV S prime:     12.90 cm/s TAPSE (M-mode): 2.3 cm LEFT ATRIUM              Index        RIGHT ATRIUM           Index LA diam:        4.30 cm  1.95 cm/m   RA Area:     21.30 cm LA Vol (A2C):   65.1 ml  29.52 ml/m  RA Volume:   68.50 ml  31.06 ml/m LA Vol (A4C):   103.0 ml 46.70 ml/m LA Biplane Vol: 89.5 ml  40.58 ml/m  AORTIC VALVE AV Area (Vmax):    1.77 cm AV Area (Vmean):   1.90 cm AV Area (VTI):     1.87 cm AV Vmax:           297.00 cm/s AV Vmean:          191.333 cm/s AV VTI:            0.575 m AV Peak Grad:      35.3 mmHg AV Mean Grad:      18.0 mmHg LVOT Vmax:         152.00 cm/s LVOT Vmean:        105.000 cm/s LVOT VTI:          0.311 m LVOT/AV VTI ratio: 0.54  AORTA Ao Root diam: 3.60 cm Ao Asc diam:  4.00 cm MITRAL VALVE                TRICUSPID VALVE MV Area (PHT): 2.21 cm     TR Peak  grad:   23.2 mmHg MV Decel Time: 343 msec     TR Vmax:        241.00 cm/s MV E velocity: 118.00 cm/s MV A velocity: 156.00 cm/s  SHUNTS MV E/A ratio:  0.76         Systemic VTI:  0.31 m                             Systemic Diam: 2.10 cm Dina Rich MD Electronically signed by Dina Rich MD Signature Date/Time: 09/27/2022/12:47:19 PM    Final    CT CHEST W CONTRAST  Result Date: 09/26/2022 CLINICAL DATA:  Soft tissue infection involving the right axilla and right flank EXAM: CT CHEST WITH CONTRAST TECHNIQUE: Multidetector CT imaging of the chest was performed during intravenous contrast administration. RADIATION DOSE REDUCTION: This exam was performed according to the departmental dose-optimization program which includes automated exposure control, adjustment of the mA and/or kV according to patient size and/or use of iterative reconstruction technique. CONTRAST:  75mL OMNIPAQUE IOHEXOL 300 MG/ML  SOLN COMPARISON:  PET-CT dated 11/18/2021 FINDINGS: Cardiovascular: The heart is normal in size. No pericardial effusion. No evidence of thoracic aortic aneurysm. Atherosclerotic calcifications of the aortic arch. Moderate three-vessel coronary atherosclerosis. Right chest port terminates at the cavoatrial junction. Mediastinum/Nodes: No suspicious mediastinal lymphadenopathy. Visualized thyroid is  unremarkable. Lungs/Pleura: Evaluation lung parenchyma is constrained by respiratory motion. Within that constraint, there are patchy/ground-glass opacities in the lateral upper lobes (series 5/image 50), posterior right upper lobe along the major fissure (series 5/image 54) and anterior might middle lobe adjacent to the minor fissure (series 5/image 69) which are new from the prior and favor mild infection/pneumonia. Mild subpleural scarring/atelectasis in the lingula, chronic. Mild dependent atelectasis in the bilateral lower lobes. Numerous hyperdense foci in the posterior lower lobes, favored sequela of prior  aspiration. No pleural effusion or pneumothorax. Upper Abdomen: Visualized upper abdomen is notable for a nodular hepatic border, suggesting cirrhosis. Stable 3.2 cm left adrenal nodule, without hypermetabolism on PET, favoring a benign adrenal adenoma. Musculoskeletal: Cutaneous thickening along the right lateral chest wall ((series 2/image 81) with mild subcutaneous extending to the right axilla, where there is focal subcutaneous stranding/inflammation (series 2/image 44), suggesting cellulitis. However, there is no drainable fluid collection/abscess. Degenerative changes of the visualized thoracolumbar spine. No focal lytic lesions in this patient with history of multiple myeloma. IMPRESSION: Suspected cellulitis along the right axilla/lateral chest wall. No drainable fluid collection/abscess. Scattered mild patchy/ground-glass opacities in the lungs bilaterally, as described above, new from prior PET. Although poorly evaluated due to motion degradation, this appearance favors mild infection/pneumonia. Additional stable ancillary findings as above. Aortic Atherosclerosis (ICD10-I70.0). Electronically Signed   By: Charline Bills M.D.   On: 09/26/2022 22:00   DG Chest Port 1 View  Result Date: 09/26/2022 CLINICAL DATA:  Sepsis EXAM: PORTABLE CHEST 1 VIEW COMPARISON:  X-ray 05/19/2020 FINDINGS: Enlarged cardiopericardial silhouette. Calcified aorta. Stable interstitial changes. No pneumothorax, effusion or consolidation. Right IJ chest port in place with tip at the SVC right atrial junction. Overlapping cardiac leads. Slight left basilar scar or atelectasis. Osteopenia and degenerative changes. IMPRESSION: Slight left basilar scar or atelectasis. Enlarged heart with calcified aorta.  Chest port Electronically Signed   By: Karen Kays M.D.   On: 09/26/2022 12:29

## 2022-10-10 ENCOUNTER — Inpatient Hospital Stay: Payer: Medicare PPO | Attending: Hematology

## 2022-10-10 ENCOUNTER — Inpatient Hospital Stay: Payer: Medicare PPO

## 2022-10-10 ENCOUNTER — Inpatient Hospital Stay: Payer: Medicare PPO | Admitting: Hematology

## 2022-10-10 VITALS — BP 113/83 | HR 81 | Temp 98.1°F | Resp 20 | Wt 277.4 lb

## 2022-10-10 VITALS — BP 156/75 | HR 78 | Temp 98.3°F | Resp 20

## 2022-10-10 DIAGNOSIS — R030 Elevated blood-pressure reading, without diagnosis of hypertension: Secondary | ICD-10-CM | POA: Diagnosis not present

## 2022-10-10 DIAGNOSIS — I700 Atherosclerosis of aorta: Secondary | ICD-10-CM | POA: Insufficient documentation

## 2022-10-10 DIAGNOSIS — M8580 Other specified disorders of bone density and structure, unspecified site: Secondary | ICD-10-CM | POA: Diagnosis not present

## 2022-10-10 DIAGNOSIS — D509 Iron deficiency anemia, unspecified: Secondary | ICD-10-CM | POA: Insufficient documentation

## 2022-10-10 DIAGNOSIS — Z79624 Long term (current) use of inhibitors of nucleotide synthesis: Secondary | ICD-10-CM | POA: Insufficient documentation

## 2022-10-10 DIAGNOSIS — Z79899 Other long term (current) drug therapy: Secondary | ICD-10-CM | POA: Diagnosis not present

## 2022-10-10 DIAGNOSIS — Z95828 Presence of other vascular implants and grafts: Secondary | ICD-10-CM

## 2022-10-10 DIAGNOSIS — C9000 Multiple myeloma not having achieved remission: Secondary | ICD-10-CM

## 2022-10-10 DIAGNOSIS — D500 Iron deficiency anemia secondary to blood loss (chronic): Secondary | ICD-10-CM

## 2022-10-10 DIAGNOSIS — K7460 Unspecified cirrhosis of liver: Secondary | ICD-10-CM | POA: Diagnosis not present

## 2022-10-10 DIAGNOSIS — Z6841 Body Mass Index (BMI) 40.0 and over, adult: Secondary | ICD-10-CM | POA: Diagnosis not present

## 2022-10-10 DIAGNOSIS — Z7952 Long term (current) use of systemic steroids: Secondary | ICD-10-CM | POA: Insufficient documentation

## 2022-10-10 DIAGNOSIS — F1120 Opioid dependence, uncomplicated: Secondary | ICD-10-CM | POA: Diagnosis not present

## 2022-10-10 DIAGNOSIS — I2510 Atherosclerotic heart disease of native coronary artery without angina pectoris: Secondary | ICD-10-CM | POA: Insufficient documentation

## 2022-10-10 DIAGNOSIS — E1142 Type 2 diabetes mellitus with diabetic polyneuropathy: Secondary | ICD-10-CM | POA: Insufficient documentation

## 2022-10-10 DIAGNOSIS — Z87442 Personal history of urinary calculi: Secondary | ICD-10-CM | POA: Diagnosis not present

## 2022-10-10 DIAGNOSIS — E785 Hyperlipidemia, unspecified: Secondary | ICD-10-CM | POA: Diagnosis not present

## 2022-10-10 DIAGNOSIS — I119 Hypertensive heart disease without heart failure: Secondary | ICD-10-CM | POA: Insufficient documentation

## 2022-10-10 DIAGNOSIS — G894 Chronic pain syndrome: Secondary | ICD-10-CM | POA: Diagnosis not present

## 2022-10-10 DIAGNOSIS — Z85828 Personal history of other malignant neoplasm of skin: Secondary | ICD-10-CM | POA: Diagnosis not present

## 2022-10-10 DIAGNOSIS — Z87891 Personal history of nicotine dependence: Secondary | ICD-10-CM | POA: Insufficient documentation

## 2022-10-10 DIAGNOSIS — M5136 Other intervertebral disc degeneration, lumbar region: Secondary | ICD-10-CM | POA: Diagnosis not present

## 2022-10-10 DIAGNOSIS — Z800 Family history of malignant neoplasm of digestive organs: Secondary | ICD-10-CM | POA: Insufficient documentation

## 2022-10-10 DIAGNOSIS — E6609 Other obesity due to excess calories: Secondary | ICD-10-CM | POA: Diagnosis not present

## 2022-10-10 DIAGNOSIS — Z7961 Long term (current) use of immunomodulator: Secondary | ICD-10-CM | POA: Insufficient documentation

## 2022-10-10 DIAGNOSIS — D649 Anemia, unspecified: Secondary | ICD-10-CM

## 2022-10-10 DIAGNOSIS — Z8041 Family history of malignant neoplasm of ovary: Secondary | ICD-10-CM | POA: Diagnosis present

## 2022-10-10 DIAGNOSIS — E119 Type 2 diabetes mellitus without complications: Secondary | ICD-10-CM | POA: Diagnosis not present

## 2022-10-10 LAB — CBC WITH DIFFERENTIAL/PLATELET
Abs Immature Granulocytes: 0.01 10*3/uL (ref 0.00–0.07)
Basophils Absolute: 0 10*3/uL (ref 0.0–0.1)
Basophils Relative: 0 %
Eosinophils Absolute: 0.1 10*3/uL (ref 0.0–0.5)
Eosinophils Relative: 1 %
HCT: 30.8 % — ABNORMAL LOW (ref 36.0–46.0)
Hemoglobin: 9.5 g/dL — ABNORMAL LOW (ref 12.0–15.0)
Immature Granulocytes: 0 %
Lymphocytes Relative: 39 %
Lymphs Abs: 1.5 10*3/uL (ref 0.7–4.0)
MCH: 31.4 pg (ref 26.0–34.0)
MCHC: 30.8 g/dL (ref 30.0–36.0)
MCV: 101.7 fL — ABNORMAL HIGH (ref 80.0–100.0)
Monocytes Absolute: 0.4 10*3/uL (ref 0.1–1.0)
Monocytes Relative: 10 %
Neutro Abs: 1.9 10*3/uL (ref 1.7–7.7)
Neutrophils Relative %: 50 %
Platelets: 103 10*3/uL — ABNORMAL LOW (ref 150–400)
RBC: 3.03 MIL/uL — ABNORMAL LOW (ref 3.87–5.11)
RDW: 19.1 % — ABNORMAL HIGH (ref 11.5–15.5)
WBC: 3.8 10*3/uL — ABNORMAL LOW (ref 4.0–10.5)
nRBC: 0 % (ref 0.0–0.2)

## 2022-10-10 LAB — COMPREHENSIVE METABOLIC PANEL
ALT: 12 U/L (ref 0–44)
AST: 23 U/L (ref 15–41)
Albumin: 3.1 g/dL — ABNORMAL LOW (ref 3.5–5.0)
Alkaline Phosphatase: 98 U/L (ref 38–126)
Anion gap: 8 (ref 5–15)
BUN: 14 mg/dL (ref 8–23)
CO2: 27 mmol/L (ref 22–32)
Calcium: 8.3 mg/dL — ABNORMAL LOW (ref 8.9–10.3)
Chloride: 104 mmol/L (ref 98–111)
Creatinine, Ser: 0.67 mg/dL (ref 0.44–1.00)
GFR, Estimated: >60 mL/min (ref >60)
Glucose, Bld: 130 mg/dL — ABNORMAL HIGH (ref 70–99)
Potassium: 3.6 mmol/L (ref 3.5–5.1)
Sodium: 139 mmol/L (ref 135–145)
Total Bilirubin: 0.3 mg/dL (ref 0.3–1.2)
Total Protein: 5.8 g/dL — ABNORMAL LOW (ref 6.5–8.1)

## 2022-10-10 LAB — MAGNESIUM: Magnesium: 1.9 mg/dL (ref 1.7–2.4)

## 2022-10-10 LAB — SAMPLE TO BLOOD BANK

## 2022-10-10 MED ORDER — SODIUM CHLORIDE 0.9% FLUSH
10.0000 mL | INTRAVENOUS | Status: DC | PRN
  Administered 2022-10-10: 10 mL via INTRAVENOUS

## 2022-10-10 MED ORDER — HEPARIN SOD (PORK) LOCK FLUSH 100 UNIT/ML IV SOLN
250.0000 [IU] | Freq: Once | INTRAVENOUS | Status: AC | PRN
  Administered 2022-10-10: 250 [IU]

## 2022-10-10 MED ORDER — LORATADINE 10 MG PO TABS
10.0000 mg | ORAL_TABLET | Freq: Once | ORAL | Status: DC
  Filled 2022-10-10: qty 1

## 2022-10-10 MED ORDER — LORATADINE 10 MG PO TABS: 10.0000 mg | ORAL_TABLET | Freq: Once | ORAL | Status: DC

## 2022-10-10 MED ORDER — SODIUM CHLORIDE 0.9% FLUSH
10.0000 mL | Freq: Once | INTRAVENOUS | Status: AC | PRN
  Administered 2022-10-10: 10 mL

## 2022-10-10 MED ORDER — CYANOCOBALAMIN 1000 MCG/ML IJ SOLN
1000.0000 ug | Freq: Once | INTRAMUSCULAR | Status: AC
  Administered 2022-10-10: 1000 ug via INTRAMUSCULAR
  Filled 2022-10-10: qty 1

## 2022-10-10 MED ORDER — SODIUM CHLORIDE 0.9 % IV SOLN
500.0000 mg | Freq: Once | INTRAVENOUS | Status: AC
  Administered 2022-10-10: 500 mg via INTRAVENOUS
  Filled 2022-10-10: qty 10

## 2022-10-10 MED ORDER — SODIUM CHLORIDE 0.9 % IV SOLN: Freq: Once | INTRAVENOUS | Status: AC

## 2022-10-10 MED ORDER — LENALIDOMIDE 15 MG PO CAPS
ORAL_CAPSULE | ORAL | 0 refills | Status: DC
Start: 2022-10-10 — End: 2022-11-18

## 2022-10-10 NOTE — Progress Notes (Signed)
Patient refill for Revlimid sent per Dr. Ellin Saba verbal order. Patient instructed to follow-up with Dr. Ellin Saba as scheduled on July 31st to discuss restarting Revlimid at that time.

## 2022-10-10 NOTE — Progress Notes (Signed)
Patient presents today for possible treatment and iron infusion.  Patient has a single lumen PICC line in L arm.  PICC line flushed well and good blood return noted.  Labs obtained from PICC line.

## 2022-10-10 NOTE — Progress Notes (Signed)
Patient presents today for Venofer, iron infusion, B12 injection, and possible Dara treatment. Patient recently had port removed and PICC line placed for "staph infection". Patient currently on IV antibiotics TID. Patient seen by Dr Ellin Saba, labs reviewed. Vital signs are stable.  We will proceed with iron infusion and B12 injection but hold Dara treatment until 7/31 per provider orders.    Patient reports taking Claritin this morning at 0730 prior to arrival.   Patient tolerated B12 injection with no complaints voiced.  Site clean and dry with no bruising or swelling noted.  No complaints of pain.    Patient tolerated treatment well with no complaints voiced.  Patient left via wheelchair with family in stable condition.  Vital signs stable at discharge.  Follow up as scheduled.

## 2022-10-10 NOTE — Patient Instructions (Signed)
[facility]  Discharge Instructions: Thank you for choosing [facility] to provide your oncology and hematology care.  If you have a lab appointment with the Cancer Center - please note that after April 8th, 2024, all labs will be drawn in the cancer center.  You do not have to check in or register with the main entrance as you have in the past but will complete your check-in in the cancer center.  Wear comfortable clothing and clothing appropriate for easy access to any Portacath or PICC line.   We strive to give you quality time with your provider. You may need to reschedule your appointment if you arrive late (15 or more minutes).  Arriving late affects you and other patients whose appointments are after yours.  Also, if you miss three or more appointments without notifying the office, you may be dismissed from the clinic at the provider's discretion.      For prescription refill requests, have your pharmacy contact our office and allow 72 hours for refills to be completed.    Today you received the following Venofer and B12 injection.  Vitamin B12 Injection What is this medication? Vitamin B12 (VAHY tuh min B12) prevents and treats low vitamin B12 levels in your body. It is used in people who do not get enough vitamin B12 from their diet or when their digestive tract does not absorb enough. Vitamin B12 plays an important role in maintaining the health of your nervous system and red blood cells. This medicine may be used for other purposes; ask your health care provider or pharmacist if you have questions. COMMON BRAND NAME(S): B-12 Compliance Kit, B-12 Injection Kit, Cyomin, Dodex, LA-12, Nutri-Twelve, Physicians EZ Use B-12, Primabalt What should I tell my care team before I take this medication? They need to know if you have any of these conditions: Kidney disease Leber's disease Megaloblastic anemia An unusual or allergic reaction to cyanocobalamin,  cobalt, other medications, foods, dyes, or preservatives Pregnant or trying to get pregnant Breast-feeding How should I use this medication? This medication is injected into a muscle or deeply under the skin. It is usually given in a clinic or care team's office. However, your care team may teach you how to inject yourself. Follow all instructions. Talk to your care team about the use of this medication in children. Special care may be needed. Overdosage: If you think you have taken too much of this medicine contact a poison control center or emergency room at once. NOTE: This medicine is only for you. Do not share this medicine with others. What if I miss a dose? If you are given your dose at a clinic or care team's office, call to reschedule your appointment. If you give your own injections, and you miss a dose, take it as soon as you can. If it is almost time for your next dose, take only that dose. Do not take double or extra doses. What may interact with this medication? Alcohol Colchicine This list may not describe all possible interactions. Give your health care provider a list of all the medicines, herbs, non-prescription drugs, or dietary supplements you use. Also tell them if you smoke, drink alcohol, or use illegal drugs. Some items may interact with your medicine. What should I watch for while using this medication? Visit your care team regularly. You may need blood work done while you are taking this medication. You may need to follow a special diet. Talk to your  care team. Limit your alcohol intake and avoid smoking to get the best benefit. What side effects may I notice from receiving this medication? Side effects that you should report to your care team as soon as possible: Allergic reactions--skin rash, itching, hives, swelling of the face, lips, tongue, or throat Swelling of the ankles, hands, or feet Trouble breathing Side effects that usually do not require medical attention  (report to your care team if they continue or are bothersome): Diarrhea This list may not describe all possible side effects. Call your doctor for medical advice about side effects. You may report side effects to FDA at 1-800-FDA-1088. Where should I keep my medication? Keep out of the reach of children. Store at room temperature between 15 and 30 degrees C (59 and 85 degrees F). Protect from light. Throw away any unused medication after the expiration date. NOTE: This sheet is a summary. It may not cover all possible information. If you have questions about this medicine, talk to your doctor, pharmacist, or health care provider.  2024 Elsevier/Gold Standard (2020-11-24 00:00:00) Iron Sucrose Injection What is this medication? IRON SUCROSE (EYE ern SOO krose) treats low levels of iron (iron deficiency anemia) in people with kidney disease. Iron is a mineral that plays an important role in making red blood cells, which carry oxygen from your lungs to the rest of your body. This medicine may be used for other purposes; ask your health care provider or pharmacist if you have questions. COMMON BRAND NAME(S): Venofer What should I tell my care team before I take this medication? They need to know if you have any of these conditions: Anemia not caused by low iron levels Heart disease High levels of iron in the blood Kidney disease Liver disease An unusual or allergic reaction to iron, other medications, foods, dyes, or preservatives Pregnant or trying to get pregnant Breastfeeding How should I use this medication? This medication is for infusion into a vein. It is given in a hospital or clinic setting. Talk to your care team about the use of this medication in children. While this medication may be prescribed for children as young as 2 years for selected conditions, precautions do apply. Overdosage: If you think you have taken too much of this medicine contact a poison control center or  emergency room at once. NOTE: This medicine is only for you. Do not share this medicine with others. What if I miss a dose? Keep appointments for follow-up doses. It is important not to miss your dose. Call your care team if you are unable to keep an appointment. What may interact with this medication? Do not take this medication with any of the following: Deferoxamine Dimercaprol Other iron products This medication may also interact with the following: Chloramphenicol Deferasirox This list may not describe all possible interactions. Give your health care provider a list of all the medicines, herbs, non-prescription drugs, or dietary supplements you use. Also tell them if you smoke, drink alcohol, or use illegal drugs. Some items may interact with your medicine. What should I watch for while using this medication? Visit your care team regularly. Tell your care team if your symptoms do not start to get better or if they get worse. You may need blood work done while you are taking this medication. You may need to follow a special diet. Talk to your care team. Foods that contain iron include: whole grains/cereals, dried fruits, beans, or peas, leafy green vegetables, and organ meats (liver, kidney). What  side effects may I notice from receiving this medication? Side effects that you should report to your care team as soon as possible: Allergic reactions--skin rash, itching, hives, swelling of the face, lips, tongue, or throat Low blood pressure--dizziness, feeling faint or lightheaded, blurry vision Shortness of breath Side effects that usually do not require medical attention (report to your care team if they continue or are bothersome): Flushing Headache Joint pain Muscle pain Nausea Pain, redness, or irritation at injection site This list may not describe all possible side effects. Call your doctor for medical advice about side effects. You may report side effects to FDA at  1-800-FDA-1088. Where should I keep my medication? This medication is given in a hospital or clinic and will not be stored at home. NOTE: This sheet is a summary. It may not cover all possible information. If you have questions about this medicine, talk to your doctor, pharmacist, or health care provider.  2024 Elsevier/Gold Standard (2021-09-22 00:00:00)  To help prevent nausea and vomiting after your treatment, we encourage you to take your nausea medication as directed.  BELOW ARE SYMPTOMS THAT SHOULD BE REPORTED IMMEDIATELY: *FEVER GREATER THAN 100.4 F (38 C) OR HIGHER *CHILLS OR SWEATING *NAUSEA AND VOMITING THAT IS NOT CONTROLLED WITH YOUR NAUSEA MEDICATION *UNUSUAL SHORTNESS OF BREATH *UNUSUAL BRUISING OR BLEEDING *URINARY PROBLEMS (pain or burning when urinating, or frequent urination) *BOWEL PROBLEMS (unusual diarrhea, constipation, pain near the anus) TENDERNESS IN MOUTH AND THROAT WITH OR WITHOUT PRESENCE OF ULCERS (sore throat, sores in mouth, or a toothache) UNUSUAL RASH, SWELLING OR PAIN  UNUSUAL VAGINAL DISCHARGE OR ITCHING   Items with * indicate a potential emergency and should be followed up as soon as possible or go to the Emergency Department if any problems should occur.  Please show the CHEMOTHERAPY ALERT CARD or IMMUNOTHERAPY ALERT CARD at check-in to the Emergency Department and triage nurse.  Should you have questions after your visit or need to cancel or reschedule your appointment, please contact [facility] [phone]  and follow the prompts.  Office hours are 8:00 a.m. to 4:30 p.m. Monday - Friday. Please note that voicemails left after 4:00 p.m. may not be returned until the following business day.  We are closed weekends and major holidays. You have access to a nurse at all times for urgent questions. Please call the main number to the clinic [phone] and follow the prompts.  For any non-urgent questions, you may also contact your  provider using MyChart. We now offer e-Visits for anyone 49 and older to request care online for non-urgent symptoms. For details visit [email or web address].   Also download the MyChart app! Go to the app store, search "MyChart", open the app, select [place], and log in with your MyChart username and password.

## 2022-10-10 NOTE — Addendum Note (Signed)
Addended by: Therese Sarah on: 10/10/2022 12:04 PM   Modules accepted: Orders

## 2022-10-10 NOTE — Patient Instructions (Signed)
[facility] at [facility] Discharge Instructions   You were seen and examined today by Dr. Ellin Saba.  He reviewed the results of your lab work which are normal/stable.   We will proceed with your Venofer infusion today.  We will plan to restart treatment with the Revlimid pills and Dara injections on 7/31.   Return as scheduled.    Thank you for choosing [facility] to provide your oncology and hematology care.  To afford each patient quality time with our provider, please arrive at least 15 minutes before your scheduled appointment time.   If you have a lab appointment with the Cancer Center please come in thru the Main Entrance and check in at the main information desk.  You need to re-schedule your appointment should you arrive 10 or more minutes late.  We strive to give you quality time with our providers, and arriving late affects you and other patients whose appointments are after yours.  Also, if you no show three or more times for appointments you may be dismissed from the clinic at the providers discretion.     Again, thank you for choosing [facility].  Our hope is that these requests will decrease the amount of time that you wait before being seen by our physicians.       _____________________________________________________________  Should you have questions after your visit to [facility], please contact our office at [phone] and follow the prompts.  Our office hours are 8:00 a.m. and 4:30 p.m. Monday - Friday.  Please note that voicemails left after 4:00 p.m. may not be returned until the following business day.  We are closed weekends and major holidays.  You do have access to a nurse 24-7, just call the main number to the clinic [phone] and do not press any options, hold on the line and a nurse will answer the phone.    For prescription refill requests, have your pharmacy  contact our office and allow 72 hours.    Due to Covid, you will need to wear a mask upon entering the hospital. If you do not have a mask, a mask will be given to you at the Main Entrance upon arrival. For doctor visits, patients may have 1 support person age 73 or older with them. For treatment visits, patients can not have anyone with them due to social distancing guidelines and our immunocompromised population.

## 2022-10-11 DIAGNOSIS — I119 Hypertensive heart disease without heart failure: Secondary | ICD-10-CM | POA: Diagnosis not present

## 2022-10-11 DIAGNOSIS — E119 Type 2 diabetes mellitus without complications: Secondary | ICD-10-CM | POA: Diagnosis not present

## 2022-10-11 DIAGNOSIS — L03313 Cellulitis of chest wall: Secondary | ICD-10-CM | POA: Diagnosis not present

## 2022-10-11 DIAGNOSIS — C9000 Multiple myeloma not having achieved remission: Secondary | ICD-10-CM | POA: Diagnosis not present

## 2022-10-11 DIAGNOSIS — Z452 Encounter for adjustment and management of vascular access device: Secondary | ICD-10-CM | POA: Diagnosis not present

## 2022-10-11 DIAGNOSIS — T80212A Local infection due to central venous catheter, initial encounter: Secondary | ICD-10-CM | POA: Diagnosis not present

## 2022-10-11 DIAGNOSIS — A419 Sepsis, unspecified organism: Secondary | ICD-10-CM | POA: Diagnosis not present

## 2022-10-11 DIAGNOSIS — L02411 Cutaneous abscess of right axilla: Secondary | ICD-10-CM | POA: Diagnosis not present

## 2022-10-11 DIAGNOSIS — A4901 Methicillin susceptible Staphylococcus aureus infection, unspecified site: Secondary | ICD-10-CM | POA: Diagnosis not present

## 2022-10-11 DIAGNOSIS — L03111 Cellulitis of right axilla: Secondary | ICD-10-CM | POA: Diagnosis not present

## 2022-10-11 DIAGNOSIS — D630 Anemia in neoplastic disease: Secondary | ICD-10-CM | POA: Diagnosis not present

## 2022-10-11 DIAGNOSIS — Z792 Long term (current) use of antibiotics: Secondary | ICD-10-CM | POA: Diagnosis not present

## 2022-10-12 DIAGNOSIS — B9561 Methicillin susceptible Staphylococcus aureus infection as the cause of diseases classified elsewhere: Secondary | ICD-10-CM | POA: Diagnosis not present

## 2022-10-12 DIAGNOSIS — R7881 Bacteremia: Secondary | ICD-10-CM | POA: Diagnosis not present

## 2022-10-13 ENCOUNTER — Ambulatory Visit: Payer: Self-pay | Admitting: *Deleted

## 2022-10-13 ENCOUNTER — Encounter: Payer: Self-pay | Admitting: *Deleted

## 2022-10-13 NOTE — Patient Outreach (Signed)
Care Coordination   Follow Up Visit Note S/P TOC Call   10/13/2022 Name: Savannah Burns MRN: [id number] DOB: 08/19/1950  Savannah Burns is a 72 y.o. year old female who sees Savannah Ip, DO for primary care. I spoke with  [name] by phone today. I was asked to provide a follow-up call to Savannah [place] regarding her recent Burns admission for sepsis and here IV antibiotic use. Patient reports feeling well and objectively she sounded well on the phone. She has home health nursing services weekly to assess and change PICC line dressing and her granddaughter is administering IV antibiotics without any complications. No questions or concerns today. She is agreeable to a follow-up call and more thorough evaluation by Savannah Regal, RN Care Coordinator who is assigned to her PCP office.  What matters to the patients health and wellness today?  Finishing IV antibiotics    Goals Addressed             This Visit's Progress    Care Coordination Services       Care Coordination Goals: Patient will keep all medical appointments Infectious Disease on 7/30 and Oncology/hematology on 7/31 Patient will take medication as prescribed IV antibiotics. Being administered by granddaughter without any problems Patient will continue to work with Home Health nurse weekly for PICC line dressing change and assessment Patient will talk with Savannah Regal, RN Care Coordinator on 11/15/22 Patient will reach out to this RN Care Coordinator at [phone] with any resource or care coordination needs prior to that appt        SDOH assessments and interventions completed:  Yes  SDOH Interventions Today    Flowsheet Row Most Recent Value  SDOH Interventions   Transportation Interventions Patient Resources (Friends/Family)  Financial Strain Interventions Intervention Not Indicated        Care Coordination Interventions:  Yes, provided  Interventions Today    Flowsheet Row Most Recent  Value  Chronic Disease   Chronic disease during today's visit Other  [Iron deficiency anemia, recent sepsis]  General Interventions   General Interventions Discussed/Reviewed General Interventions Discussed, General Interventions Reviewed, Labs, Durable Medical Equipment (DME), Doctor Visits, Referral to Nurse, Communication with  Labs --  [CBC]  Doctor Visits Discussed/Reviewed Doctor Visits Discussed, Doctor Visits Reviewed, PCP, Specialist  Durable Medical Equipment (DME) Other  [facility] line and IV Pump]  PCP/Specialist Visits Compliance with follow-up visit  [Infectious Disease on 7/30 and Oncology on 7/31. Verified granddaughter will provide transportation.]  Communication with RN  [Consulted by Savannah Heritage, RN after [facility] call re: need for close interval f/u s/p hosp discharge for sepsis. I am covering for Savannah Regal, RN who is the [facility] for PCP office. Will send in-basket message to Savannah Burns with synopsis.]  Exercise Interventions   Exercise Discussed/Reviewed Physical Activity  Physical Activity Discussed/Reviewed Physical Activity Discussed, Physical Activity Reviewed  [able to walk but has assistance with some ADLs like bathing and driving]  Education Interventions   Education Provided Provided Education  Provided Verbal Education On When to see the doctor, Medication  Macy Mis out to provider with new or worsening symptoms. Work with Home Health nurse re: PICC line care. Granddaughter is administering IV antibiotics without any problems]  Nutrition Interventions   Nutrition Discussed/Reviewed Nutrition Discussed, Nutrition Reviewed  Pharmacy Interventions   Pharmacy Dicussed/Reviewed Pharmacy Topics Discussed, Pharmacy Topics Reviewed, Medications and their functions  [IV antibiotic administration through PICC line]  Safety Interventions   Safety Discussed/Reviewed Safety Discussed, Safety  Reviewed, Fall Risk, Home Safety        Follow up plan: Follow up call scheduled for 11/15/22 with  Savannah Arbour, RN Care Coordinator [phone]   Encounter Outcome:  Pt. Visit Completed   Demetrios Loll, BSN, RN-BC RN Care Coordinator [facility]  [facility] Direct Dial: [phone] Main #: [phone]

## 2022-10-13 NOTE — Patient Outreach (Incomplete Revision)
Care Coordination   Follow Up Visit Note S/P TOC Call   10/13/2022 Name: Savannah Burns MRN: [id number] DOB: 08/19/1950  Savannah Burns is a 72 y.o. year old female who sees Raliegh Ip, DO for primary care. I spoke with  [name] by phone today. I was asked to provide a follow-up call to Savannah Burns regarding her recent hospital admission for sepsis and here IV antibiotic use. Patient reports feeling well and objectively she sounded well on the phone. She has home health nursing services weekly to assess and change PICC line dressing and her granddaughter is administering IV antibiotics without any complications. No questions or concerns today. She is agreeable to a follow-up call and more thorough evaluation by Marval Regal, RN Care Coordinator who is assigned to her PCP office.  What matters to the patients health and wellness today?  Finishing IV antibiotics    Goals Addressed             This Visit's Progress    Care Coordination Services       Care Coordination Goals: Patient will keep all medical appointments Infectious Disease on 7/30 and Oncology/hematology on 7/31 Patient will take medication as prescribed IV antibiotics. Being administered by granddaughter without any problems Patient will continue to work with Home Health nurse weekly for PICC line dressing change and assessment Patient will talk with Marval Regal, RN Care Coordinator on 11/15/22 Patient will reach out to this RN Care Coordinator at [phone] with any resource or care coordination needs prior to that appt        SDOH assessments and interventions completed:  Yes  SDOH Interventions Today    Flowsheet Row Most Recent Value  SDOH Interventions   Transportation Interventions Patient Resources (Friends/Family)  Financial Strain Interventions Intervention Not Indicated        Care Coordination Interventions:  Yes, provided  Interventions Today    Flowsheet Row Most Recent  Value  Chronic Disease   Chronic disease during today's visit Other  [Iron deficiency anemia, recent sepsis]  General Interventions   General Interventions Discussed/Reviewed General Interventions Discussed, General Interventions Reviewed, Labs, Durable Medical Equipment (DME), Doctor Visits, Referral to Nurse, Communication with  Labs --  [CBC]  Doctor Visits Discussed/Reviewed Doctor Visits Discussed, Doctor Visits Reviewed, PCP, Specialist  Durable Medical Equipment (DME) Other  [facility] line and IV Pump]  PCP/Specialist Visits Compliance with follow-up visit  [Infectious Disease on 7/30 and Oncology on 7/31. Verified granddaughter will provide transportation.]  Communication with RN  [Consulted by Armanda Heritage, RN after [facility] call re: need for close interval f/u s/p hosp discharge for sepsis. I am covering for Marval Regal, RN who is the [facility] for PCP office. Will send in-basket message to [facility] with synopsis.]  Exercise Interventions   Exercise Discussed/Reviewed Physical Activity  Physical Activity Discussed/Reviewed Physical Activity Discussed, Physical Activity Reviewed  [able to walk but has assistance with some ADLs like bathing and driving]  Education Interventions   Education Provided Provided Education  Provided Verbal Education On When to see the doctor, Medication  Macy Mis out to provider with new or worsening symptoms. Work with Home Health nurse re: PICC line care. Granddaughter is administering IV antibiotics without any problems]  Nutrition Interventions   Nutrition Discussed/Reviewed Nutrition Discussed, Nutrition Reviewed  Pharmacy Interventions   Pharmacy Dicussed/Reviewed Pharmacy Topics Discussed, Pharmacy Topics Reviewed, Medications and their functions  [IV antibiotic administration through PICC line]  Safety Interventions   Safety Discussed/Reviewed Safety Discussed, Safety  Reviewed, Fall Risk, Home Safety        Follow up plan: Follow up call scheduled for 11/15/22 with  Edd Arbour, RN Care Coordinator [phone]   Encounter Outcome:  Pt. Visit Completed   Demetrios Loll, BSN, RN-BC RN Care Coordinator [facility]  [facility] Direct Dial: [phone] Main #: [phone]

## 2022-10-18 DIAGNOSIS — E119 Type 2 diabetes mellitus without complications: Secondary | ICD-10-CM | POA: Diagnosis not present

## 2022-10-18 DIAGNOSIS — T80212A Local infection due to central venous catheter, initial encounter: Secondary | ICD-10-CM | POA: Diagnosis not present

## 2022-10-18 DIAGNOSIS — L02411 Cutaneous abscess of right axilla: Secondary | ICD-10-CM | POA: Diagnosis not present

## 2022-10-18 DIAGNOSIS — D630 Anemia in neoplastic disease: Secondary | ICD-10-CM | POA: Diagnosis not present

## 2022-10-18 DIAGNOSIS — L03111 Cellulitis of right axilla: Secondary | ICD-10-CM | POA: Diagnosis not present

## 2022-10-18 DIAGNOSIS — C9000 Multiple myeloma not having achieved remission: Secondary | ICD-10-CM | POA: Diagnosis not present

## 2022-10-18 DIAGNOSIS — A419 Sepsis, unspecified organism: Secondary | ICD-10-CM | POA: Diagnosis not present

## 2022-10-18 DIAGNOSIS — I119 Hypertensive heart disease without heart failure: Secondary | ICD-10-CM | POA: Diagnosis not present

## 2022-10-18 DIAGNOSIS — L03313 Cellulitis of chest wall: Secondary | ICD-10-CM | POA: Diagnosis not present

## 2022-10-19 DIAGNOSIS — B9561 Methicillin susceptible Staphylococcus aureus infection as the cause of diseases classified elsewhere: Secondary | ICD-10-CM | POA: Diagnosis not present

## 2022-10-19 DIAGNOSIS — R7881 Bacteremia: Secondary | ICD-10-CM | POA: Diagnosis not present

## 2022-10-21 ENCOUNTER — Ambulatory Visit: Payer: Medicare PPO

## 2022-10-21 DIAGNOSIS — I119 Hypertensive heart disease without heart failure: Secondary | ICD-10-CM

## 2022-10-21 DIAGNOSIS — A419 Sepsis, unspecified organism: Secondary | ICD-10-CM

## 2022-10-21 DIAGNOSIS — C9000 Multiple myeloma not having achieved remission: Secondary | ICD-10-CM | POA: Diagnosis not present

## 2022-10-21 DIAGNOSIS — L02411 Cutaneous abscess of right axilla: Secondary | ICD-10-CM | POA: Diagnosis not present

## 2022-10-21 DIAGNOSIS — D630 Anemia in neoplastic disease: Secondary | ICD-10-CM

## 2022-10-21 DIAGNOSIS — J449 Chronic obstructive pulmonary disease, unspecified: Secondary | ICD-10-CM

## 2022-10-21 DIAGNOSIS — E119 Type 2 diabetes mellitus without complications: Secondary | ICD-10-CM

## 2022-10-21 DIAGNOSIS — L03313 Cellulitis of chest wall: Secondary | ICD-10-CM

## 2022-10-21 DIAGNOSIS — L03111 Cellulitis of right axilla: Secondary | ICD-10-CM

## 2022-10-21 DIAGNOSIS — J9611 Chronic respiratory failure with hypoxia: Secondary | ICD-10-CM

## 2022-10-24 DIAGNOSIS — J449 Chronic obstructive pulmonary disease, unspecified: Secondary | ICD-10-CM | POA: Diagnosis not present

## 2022-10-24 NOTE — Progress Notes (Unsigned)
Patient: Savannah Burns  DOB: 11/14/1950 MRN: [id number] PCP: Raliegh Ip, DO  Referring Provider:    Subjective:   No chief complaint on file.    HPI: Savannah Burns is a 72 y.o. female here for hospital follow up.   Required hospitalization July 1 and found to have MSSA bacteremia with por-a-cath in place (hx multiple myeloma followed by Dr. Ellin Saba, maintained on lenalidomide and dexamethasone with port a cath in place). Also with history of NASH cirrhosis, DMT2, HTN, HLD, COPD with chronic hypoxia requiring 3 LPM.   Has had multiple problems with boils on skin prior to hospital stay, some opened and spontaneously drained. Port was removed with blood cultures collected prior to x 3d. PICC was placed for cefazolin for 4 week duration. She was unable to have TEE.    ROS  Past Medical History:  Diagnosis Date   Adrenal adenoma, left    Stable   Anxiety    Arthritis    bilateral hands   Depression    Diabetes mellitus, type 2 (HCC) 08/12/2008   Qualifier: Diagnosis of  By: Dayton Martes MD, Talia     Dyspnea    Esophageal varices (HCC)    Grade II diastolic dysfunction    History of kidney stones    Hyperlipidemia    Hypertension    Lower back pain    Lower GI bleed 03/19/2020   Panic attacks    Pneumonia    currently taking antibiotic and prednisone for early stages of pneumonia   Pulmonary nodules    bilateral   Skin cancer    face    Outpatient Medications Prior to Visit  Medication Sig Dispense Refill   acyclovir (ZOVIRAX) 400 MG tablet Take 1 tablet (400 mg total) by mouth 2 (two) times daily. 60 tablet 6   baclofen (LIORESAL) 10 MG tablet Take 10 mg by mouth 2 (two) times daily.     Blood Glucose Calibration (TRUE METRIX LEVEL 1) Low SOLN Use w/ glucose monitor Dx E11.9 3 each 0   Blood Glucose Monitoring Suppl (TRUE METRIX AIR GLUCOSE METER) w/Device KIT Check BS daily Dx E11.9 1 kit 0   ceFAZolin (ANCEF) 2-4 GM/100ML-% IVPB Inject 100 mLs  (2 g total) into the vein every 8 (eight) hours for 73 doses. 7300 mL 0   ceFAZolin (ANCEF) IVPB Inject 2 g into the vein every 8 (eight) hours for 23 days. Indication:  Bacteremia First Dose: No Last Day of Therapy:  10/26/22 Labs - Once weekly:  CBC/D and BMP, Labs - Once weekly: ESR and CRP Method of administration: IV Push Method of administration may be changed at the discretion of home infusion pharmacist based upon assessment of the patient and/or caregiver's ability to self-administer the medication ordered. 69 Units 0   Cholecalciferol (VITAMIN D) 50 MCG (2000 UT) tablet Take 2,000 Units by mouth daily.     clobetasol (TEMOVATE) 0.05 % external solution Apply 1 Application topically 2 (two) times daily.     Cyanocobalamin (B-12 COMPLIANCE INJECTION) 1000 MCG/ML KIT Inject 1,000 mcg as directed every 30 (thirty) days.     dexamethasone (DECADRON) 4 MG tablet Take 5 tablets (20 mg total) by mouth once a week. 20 tablet 3   esomeprazole (NEXIUM) 40 MG capsule Take 1 capsule (40 mg total) by mouth daily. 30 capsule 2   ferrous sulfate 325 (65 FE) MG tablet Take 1 tablet (325 mg total) by mouth daily with breakfast. 30 tablet 3  glucose blood (TRUE METRIX BLOOD GLUCOSE TEST) test strip Check BS daily Dx E11.9 100 each 3   lenalidomide (REVLIMID) 15 MG capsule TAKE 1 CAPSULE BY MOUTH EVERY DAY FOR 21 DAYS ON THEN 7 DAYS OFF 21 capsule 0   lidocaine-prilocaine (EMLA) cream Apply 1 Application topically as needed. Apply a small amount to port a cath site and cover with plastic wrap 1 hour prior to infusion appointments 30 g 3   lovastatin (MEVACOR) 20 MG tablet Take 1 tablet (20 mg total) by mouth at bedtime. 90 tablet 3   nystatin (MYCOSTATIN/NYSTOP) powder Apply topically 2 (two) times daily. 15 g 0   oxyCODONE-acetaminophen (PERCOCET) 10-325 MG tablet Take 1 tablet by mouth every 6 (six) hours as needed for pain.     OXYGEN Inhale 3 L into the lungs continuous.     OZEMPIC, 0.25 OR 0.5  MG/DOSE, 2 MG/3ML SOPN Inject 2 mg into the skin once a week.     potassium chloride SA (KLOR-CON) 20 MEQ tablet Take 2 tablets (40 mEq total) by mouth 2 (two) times daily. (Patient taking differently: Take 40 mEq by mouth daily.) 360 tablet 3   prochlorperazine (COMPAZINE) 10 MG tablet Take 1 tablet (10 mg total) by mouth every 6 (six) hours as needed for nausea or vomiting. 30 tablet 6   torsemide (DEMADEX) 20 MG tablet Take 2.5 tablets (50 mg total) by mouth 2 (two) times daily.     TRUEplus Lancets 33G MISC Check BS daily Dx E11.9 100 each 3   venlafaxine XR (EFFEXOR-XR) 150 MG 24 hr capsule Take 1 capsule (150 mg total) by mouth daily with breakfast. 30 capsule 2   Facility-Administered Medications Prior to Visit  Medication Dose Route Frequency Provider Last Rate Last Admin   sodium chloride flush (NS) 0.9 % injection 10 mL  10 mL Intravenous PRN Pennington, Rebekah M, PA-C   10 mL at 03/08/22 1344     Allergies  Allergen Reactions   Keflex [Cephalexin] Nausea And Vomiting    Social History   Tobacco Use   Smoking status: Former    Current packs/day: 0.00    Average packs/day: 1.5 packs/day for 40.0 years (60.0 ttl pk-yrs)    Types: Cigarettes    Start date: 04/29/1975    Quit date: 04/29/2015    Years since quitting: 7.4   Smokeless tobacco: Never   Tobacco comments:    Quit smoking 04/2015- Previous 1.5 ppd smoker  Vaping Use   Vaping status: Never Used  Substance Use Topics   Alcohol use: No    Alcohol/week: 0.0 standard drinks of alcohol   Drug use: No    Family History  Problem Relation Age of Onset   Diabetes Father    Heart disease Father 69       MI   Hypertension Father    Anemia Mother        Transfusion dependent   COPD Sister    Cancer Paternal Grandmother 79       Pancreatic    Objective:  There were no vitals filed for this visit. There is no height or weight on file to calculate BMI.  Physical Exam  Lab Results: Lab Results  Component Value  Date   WBC 3.8 (L) 10/10/2022   HGB 9.5 (L) 10/10/2022   HCT 30.8 (L) 10/10/2022   MCV 101.7 (H) 10/10/2022   PLT 103 (L) 10/10/2022    Lab Results  Component Value Date   CREATININE 0.67 10/10/2022  BUN 14 10/10/2022   NA 139 10/10/2022   K 3.6 10/10/2022   CL 104 10/10/2022   CO2 27 10/10/2022    Lab Results  Component Value Date   ALT 12 10/10/2022   AST 23 10/10/2022   ALKPHOS 98 10/10/2022   BILITOT 0.3 10/10/2022     Assessment & Plan:   Problem List Items Addressed This Visit   None   Rexene Alberts, MSN, NP-C [facility] Pager: [phone] Office: [phone]  10/24/22  8:38 PM

## 2022-10-25 ENCOUNTER — Inpatient Hospital Stay: Payer: Medicare PPO

## 2022-10-25 ENCOUNTER — Encounter: Payer: Self-pay | Admitting: Infectious Diseases

## 2022-10-25 ENCOUNTER — Other Ambulatory Visit: Payer: Self-pay

## 2022-10-25 ENCOUNTER — Inpatient Hospital Stay: Payer: Medicare PPO | Admitting: Hematology

## 2022-10-25 ENCOUNTER — Ambulatory Visit (INDEPENDENT_AMBULATORY_CARE_PROVIDER_SITE_OTHER): Payer: Medicare PPO | Admitting: Infectious Diseases

## 2022-10-25 ENCOUNTER — Telehealth: Payer: Self-pay

## 2022-10-25 VITALS — BP 154/94 | HR 79 | Temp 97.9°F

## 2022-10-25 DIAGNOSIS — Z792 Long term (current) use of antibiotics: Secondary | ICD-10-CM

## 2022-10-25 DIAGNOSIS — T80212S Local infection due to central venous catheter, sequela: Secondary | ICD-10-CM

## 2022-10-25 DIAGNOSIS — B9561 Methicillin susceptible Staphylococcus aureus infection as the cause of diseases classified elsewhere: Secondary | ICD-10-CM | POA: Diagnosis not present

## 2022-10-25 DIAGNOSIS — R7881 Bacteremia: Secondary | ICD-10-CM | POA: Diagnosis not present

## 2022-10-25 MED ORDER — MUPIROCIN 2 % EX OINT: 1.0000 | TOPICAL_OINTMENT | Freq: Two times a day (BID) | CUTANEOUS | 1 refills | Status: DC

## 2022-10-25 NOTE — Telephone Encounter (Signed)
Orders received and confirmed by Pam with [employer].  Juanita Laster, RMA

## 2022-10-25 NOTE — Patient Instructions (Addendum)
   For your skin wash - HIBICLENS is what I was talking about. Prior to your port going back in to wash daily with this from head to toe. Also Start the mupirocin ointment twice a day to the nose for 5 days before skin incision for port placement.   Plan to stop IV antibiotics after tomorrow.   Will have you back for a virtual appointment with Dr. Drue Second in 2 weeks to make sure you are still feeling better.

## 2022-10-25 NOTE — Assessment & Plan Note (Signed)
All OPAT lab work reviewed and within normal limits.  CRP is just mildly out of range @ 11 (<10 N) but clinically all infectious syndrome resolved.   I am not convinced that her nightmares are due to the IV antibiotics but more the trauma associated with the abrupt admission to deal with sepsis syndrome.  She agrees that it is more likely to that and may persist for some time after antibiotics stopped.

## 2022-10-25 NOTE — Assessment & Plan Note (Addendum)
Removed due to MSSA bacteremia and high degree of concern for seeding the device.   We discussed decolonization protocol prior to port going back in with 5 days of BID mupirocin nasal applications and Hibiclens showers from head to toe.  Would hold off replacing during the next 2-week watch off antibiotics for more reassurance.

## 2022-10-25 NOTE — Telephone Encounter (Signed)
Per Durwin Nora, NP patient's picc can be pulled tomorrow 7/31 after last dose. Community message sent to hh team. Juanita Laster, RMA

## 2022-10-25 NOTE — Assessment & Plan Note (Signed)
Hospitalization reviewed and discussed with Savannah Burns and her granddaughter.  It sounds like she had a large furuncle that was quite inflamed as the original nidus of her bacteremia.  Conservatively her port was removed prior to discharge and she has now completed nearly 4 weeks of IV cefazolin 3 times daily since then.  She feels 100% better. Will have her do a virtual follow-up in 10 to 14 days to ensure that she continues to feel well after stopping her antibiotics. I think holding her chemo for another 2 weeks is the best approach to ensure that she has no signs of relapsing infection symptoms.  PICC line can be removed after tomorrow's final doses.

## 2022-10-25 NOTE — Progress Notes (Signed)
[facility] [address], [place] [address]    Clinic Day:  10/26/22    Referring physician: Raliegh Ip, DO  Patient Care Team: Raliegh Ip, DO as PCP - General (Family Medicine) Wyline Mood Dorothe Pea, MD as PCP - Cardiology (Cardiology) Letta Kocher, MD (Rehabilitation) Coralyn Helling, MD as Consulting Physician (Pulmonary Disease) Lanelle Bal, DO as Consulting Physician (Internal Medicine) Merryl Hacker, NP as Nurse Practitioner (Nurse Practitioner) Bonna Gains, MD as Referring Physician (Gastroenterology) Doreatha Massed, MD as Medical Oncologist (Medical Oncology) Therese Sarah, RN as Oncology Nurse Navigator (Medical Oncology) Clinton Gallant, RN as [facility] Care Management   ASSESSMENT & PLAN:   Assessment: 1. 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  Orderly maturation of erythroid and myeloid series. - Myeloma FISH panel: t(11;14) - Cytogenetics: Failed to grow metaphases - PET scan (11/18/2021): No evidence of active myeloma or plasmacytoma.  Right upper lobe lung infection. - Skull x-rays (10/29/2021) multiple discrete round lucent/lytic lesions in the skull - Worsening M spike and free light chain ratio.  Normal creatinine and calcium.  Multifactorial anemia including blood loss and bone marrow infiltration. - Cycle 1 of DRd started on 12/14/2021 with lenalidomide 15 mg 3 weeks on/1 week off and dexamethasone 20 mg weekly - Hospitalized from 09/26/2022 through 10/03/2022 with MSSA bacteremia, port removed, cefepime started, to continue until 10/26/2022   2.  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  Quit smoking 15 years ago.  No exposure to chemicals or pesticides. - Paternal grandmother had ovarian cancer.  Sister had ovarian cancer.   3.  Cirrhosis: - Likely  secondary to NAFLD with splenomegaly resulting in mild thrombocytopenia.  Seen by transplant hepatology service at [facility].    Plan: 1.  Stage II IgG kappa myeloma, standard risk: - Because of recent MSSA bacteremia, we held her treatment. - She has completed IV cephalosporin today. - She denied any fevers or chills. - Reviewed labs today: Platelet count is 95 K, likely from recent antibiotics. - I have recommended that we keep the PICC line for now.  If she does not have any infections in the next 4 weeks, will reinsert port. - She will come back next week to initiate her Darzalex cycle 1 on 11/02/2022.  She will start taking Revlimid at the same time.  Will continue dexamethasone 20 mg weekly.  RTC on 11/30/2022.   2.  Iron deficiency anemia from chronic GI bleed: - Continue Venofer 500 mg every 2 weeks.  Hemoglobin today is 11.1 and ferritin improved to 156.   3.  Infection prophylaxis: - Continue acyclovir 400 mg twice daily.  Aspirin on hold.   4.  Cirrhosis: - Continue Lasix 20 mg daily, carvedilol 6.5 mg daily and lactulose daily.   5.  Peripheral neuropathy: - She feels like walking on tiny pillows.  This is stable.    No orders of the defined types were placed in this encounter.     Alben Deeds Teague,acting as a Neurosurgeon for Doreatha Massed, MD.,have documented all relevant documentation on the behalf of Doreatha Massed, MD,as directed by  Doreatha Massed, MD while in the presence of Doreatha Massed, MD.  I, Doreatha Massed MD, have reviewed the above documentation for accuracy and completeness, and I agree with the above.     Doreatha Massed, MD  7/31/20246:30 PM  CHIEF COMPLAINT:   Diagnosis: stage II standard risk IgG kappa plasma cell myeloma    Cancer Staging  Multiple myeloma (HCC) Staging form: Plasma Cell Myeloma and Plasma Cell Disorders, AJCC 8th Edition - Clinical stage from 11/30/2021: RISS Stage II (Beta-2-microglobulin (mg/L): 2.8,  Albumin (g/dL): 3, ISS: Stage II, High-risk cytogenetics: Absent, LDH: Normal) - Signed by Doreatha Massed, MD on 11/30/2021    Prior Therapy: none  Current Therapy:  Daratumumab, lenalidomide and dexamethasone    HISTORY OF PRESENT ILLNESS:   Oncology History  Multiple myeloma (HCC)  11/30/2021 Initial Diagnosis   Multiple myeloma (HCC)   11/30/2021 Cancer Staging   Staging form: Plasma Cell Myeloma and Plasma Cell Disorders, AJCC 8th Edition - Clinical stage from 11/30/2021: RISS Stage II (Beta-2-microglobulin (mg/L): 2.8, Albumin (g/dL): 3, ISS: Stage II, High-risk cytogenetics: Absent, LDH: Normal) - Signed by Doreatha Massed, MD on 11/30/2021 Histopathologic type: Multiple myeloma Stage prefix: Initial diagnosis Beta 2 microglobulin range (mg/L): Less than 3.5 Albumin range (g/dL): Less than 3.5 Cytogenetics: t(11;14) translocation Serum calcium level: Normal   12/14/2021 -  Chemotherapy   Patient is on Treatment Plan : MYELOMA  Daratumumab SQ + Lenalidomide + Dexamethasone (DaraRd) q28d        INTERVAL HISTORY:   Savannah Burns is a 72 y.o. female presenting to clinic today for follow up of stage II standard risk IgG kappa plasma cell myeloma. She was last seen by me on 10/10/22.  Today, she states that she is doing well overall. Her appetite level is at 100%. Her energy level is at 30%.   She finished her last dose of antibiotics today. Her ulcers and boils on the underarms have healed. She is concerned about removing her picc line today, as she does not believe healthcare workers will be able to get veins for treatment. She denies any fevers or chills. She reports she has Revlimid pills at home. She is taking all medications as prescribed. She reports that her feet continue to feel like she is stepping on pillows when she attempts to walk.  PAST MEDICAL HISTORY:   Past Medical History: Past Medical History:  Diagnosis Date   Adrenal adenoma, left    Stable   Anxiety     Arthritis    bilateral hands   Depression    Diabetes mellitus, type 2 (HCC) 08/12/2008   Qualifier: Diagnosis of  By: Dayton Martes MD, Talia     Dyspnea    Esophageal varices (HCC)    Grade II diastolic dysfunction    History of kidney stones    Hyperlipidemia    Hypertension    Lower back pain    Lower GI bleed 03/19/2020   Panic attacks    Pneumonia    currently taking antibiotic and prednisone for early stages of pneumonia   Pulmonary nodules    bilateral   Skin cancer    face    Surgical History: Past Surgical History:  Procedure Laterality Date   BIOPSY  04/07/2020   Procedure: BIOPSY;  Surgeon: Lanelle Bal, DO;  [facility];  Service: Endoscopy;;   Breast Cystectomy  Right    CESAREAN SECTION     COLONOSCOPY WITH PROPOFOL N/A 01/25/2020   Dr. Marletta Lor: Nonbleeding internal hemorrhoids, diverticulosis, 5 mm polyp removed from the ascending colon, 10 mm polyp removed from the sigmoid colon, 30 mm polyp (tubulovillous adenoma with no high-grade dysplasia) removed from the transverse colon via piecemeal status post tattoo.  Other polyps were  tubular adenomas.  3 month surveillance colonoscopy recommended.   COLONOSCOPY WITH PROPOFOL N/A 04/07/2020   Procedure: COLONOSCOPY WITH PROPOFOL;  Surgeon: Lanelle Bal, DO;  [facility]: [facility];  Service: Endoscopy;  Laterality: N/A;  3:00pm, pt knows new time per office   CYSTOSCOPY/URETEROSCOPY/HOLMIUM LASER/STENT PLACEMENT Bilateral 03/01/2019   Procedure: CYSTOSCOPY/RETROGRADEURETEROSCOPY/HOLMIUM LASER/STENT PLACEMENT;  Surgeon: Rene Paci, MD;  Location: [facility];  Service: Urology;  Laterality: Bilateral;  ONLY NEEDS 60 MIN   ESOPHAGOGASTRODUODENOSCOPY (EGD) WITH PROPOFOL N/A 01/25/2020   Dr. Marletta Lor: 4 columns grade 1 esophageal varices   ESOPHAGOGASTRODUODENOSCOPY (EGD) WITH PROPOFOL N/A 05/18/2020   Procedure: ESOPHAGOGASTRODUODENOSCOPY (EGD) WITH PROPOFOL;  Surgeon: Lanelle Bal, DO;  [facility];  Service: Endoscopy;  Laterality: N/A;   ESOPHAGOGASTRODUODENOSCOPY (EGD) WITH PROPOFOL N/A 07/28/2020   Procedure: ESOPHAGOGASTRODUODENOSCOPY (EGD) WITH PROPOFOL;  Surgeon: Lanelle Bal, DO;  [facility];  Service: Endoscopy;  Laterality: N/A;  am or early PM due to givens capsule placement   GIVENS CAPSULE STUDY N/A 05/18/2020   Procedure: GIVENS CAPSULE STUDY;  Surgeon: Dolores Frame, MD;  [facility];  Service: Gastroenterology;  Laterality: N/A;   GIVENS CAPSULE STUDY N/A 07/28/2020   Procedure: GIVENS CAPSULE STUDY;  Surgeon: Lanelle Bal, DO;  [facility];  Service: Endoscopy;  Laterality: N/A;   IR IMAGING GUIDED PORT INSERTION  12/02/2021   IRRIGATION AND DEBRIDEMENT ABSCESS Right 09/28/2022   Procedure: IRRIGATION AND DEBRIDEMENT ABSCESS;  Surgeon: Lewie Chamber, DO;  Location: [facility];  Service: General;  Laterality: Right;   POLYPECTOMY  01/25/2020   Procedure: POLYPECTOMY;  Surgeon: Lanelle Bal, DO;  [facility];  Service: Endoscopy;;   POLYPECTOMY  04/07/2020   Procedure: POLYPECTOMY INTESTINAL;  Surgeon: Lanelle Bal, DO;  [facility];  Service: Endoscopy;;   PORT-A-CATH REMOVAL Right 09/28/2022   Procedure: MINOR REMOVAL PORT-A-CATH;  Surgeon: Lewie Chamber, DO;  Location: [facility];  Service: General;  Laterality: Right;   SKIN CANCER EXCISION     Face   SPINE SURGERY     SUBMUCOSAL TATTOO INJECTION  01/25/2020   Procedure: SUBMUCOSAL TATTOO INJECTION;  Surgeon: Lanelle Bal, DO;  [facility];  Service: Endoscopy;;    Social History: Social History   Socioeconomic History   Marital status: Widowed    Spouse name: Not on file   Number of children: 2   Years of education: 14   Highest education level: Not on file  Occupational History   Occupation: Retired   Tobacco Use   Smoking status: Former    Current packs/day: 0.00    Average  packs/day: 1.5 packs/day for 40.0 years (60.0 ttl pk-yrs)    Types: Cigarettes    Start date: 04/29/1975    Quit date: 04/29/2015    Years since quitting: 7.4   Smokeless tobacco: Never   Tobacco comments:    Quit smoking 04/2015- Previous 1.5 ppd smoker  Vaping Use   Vaping status: Never Used  Substance and Sexual Activity   Alcohol use: No    Alcohol/week: 0.0 standard drinks of alcohol   Drug use: No   Sexual activity: Not Currently    Birth control/protection: Post-menopausal  Other Topics Concern   Not on file  Social History Narrative   Her 17 year old granddaughter lives with her - one daughter lives nearby, but she doesn't have a good relationship with her. Has a great relationship  with other daughter who lives 1.5 hrs away - talks to her daily on the phone.   Social Determinants of Health   Financial Resource Strain: Low Risk  (10/13/2022)   Overall Financial Resource Strain (CARDIA)    Difficulty of Paying Living Expenses: Not very hard  Food Insecurity: Food Insecurity Present (09/26/2022)   Hunger Vital Sign    Worried About Running Out of Food in the Last Year: Sometimes true    Ran Out of Food in the Last Year: Never true  Transportation Needs: No Transportation Needs (10/13/2022)   PRAPARE - Administrator, Civil Service (Medical): No    Lack of Transportation (Non-Medical): No  Physical Activity: Inactive (07/27/2021)   Exercise Vital Sign    Days of Exercise per Week: 0 days    Minutes of Exercise per Session: 0 min  Stress: Stress Concern Present (07/27/2021)   [employer] of Occupational Health - Occupational Stress Questionnaire    Feeling of Stress : To some extent  Social Connections: Socially Isolated (07/27/2021)   Social Connection and Isolation Panel [NHANES]    Frequency of Communication with Friends and Family: More than three times a week    Frequency of Social Gatherings with Friends and Family: Once a week    Attends Religious Services:  Never    Database administrator or Organizations: No    Attends Banker Meetings: Never    Marital Status: Widowed  Intimate Partner Violence: Not At Risk (09/26/2022)   Humiliation, Afraid, Rape, and Kick questionnaire    Fear of Current or Ex-Partner: No    Emotionally Abused: No    Physically Abused: No    Sexually Abused: No    Family History: Family History  Problem Relation Age of Onset   Diabetes Father    Heart disease Father 6       MI   Hypertension Father    Anemia Mother        Transfusion dependent   COPD Sister    Cancer Paternal Grandmother 30       Pancreatic    Current Medications:  Current Outpatient Medications:    acyclovir (ZOVIRAX) 400 MG tablet, Take 1 tablet (400 mg total) by mouth 2 (two) times daily., Disp: 60 tablet, Rfl: 6   baclofen (LIORESAL) 10 MG tablet, Take 10 mg by mouth 2 (two) times daily., Disp: , Rfl:    Blood Glucose Calibration (TRUE METRIX LEVEL 1) Low SOLN, Use w/ glucose monitor Dx E11.9, Disp: 3 each, Rfl: 0   Blood Glucose Monitoring Suppl (TRUE METRIX AIR GLUCOSE METER) w/Device KIT, Check BS daily Dx E11.9, Disp: 1 kit, Rfl: 0   ceFAZolin (ANCEF) 2-4 GM/100ML-% IVPB, Inject 100 mLs (2 g total) into the vein every 8 (eight) hours for 73 doses., Disp: 7300 mL, Rfl: 0   ceFAZolin (ANCEF) IVPB, Inject 2 g into the vein every 8 (eight) hours for 23 days. Indication:  Bacteremia First Dose: No Last Day of Therapy:  10/26/22 Labs - Once weekly:  CBC/D and BMP, Labs - Once weekly: ESR and CRP Method of administration: IV Push Method of administration may be changed at the discretion of home infusion pharmacist based upon assessment of the patient and/or caregiver's ability to self-administer the medication ordered., Disp: 69 Units, Rfl: 0   Cholecalciferol (VITAMIN D) 50 MCG (2000 UT) tablet, Take 2,000 Units by mouth daily., Disp: , Rfl:    clobetasol (TEMOVATE) 0.05 % external solution, Apply 1  Application topically 2 (two)  times daily., Disp: , Rfl:    Cyanocobalamin (B-12 COMPLIANCE INJECTION) 1000 MCG/ML KIT, Inject 1,000 mcg as directed every 30 (thirty) days., Disp: , Rfl:    dexamethasone (DECADRON) 4 MG tablet, Take 5 tablets (20 mg total) by mouth once a week., Disp: 20 tablet, Rfl: 3   esomeprazole (NEXIUM) 40 MG capsule, Take 1 capsule (40 mg total) by mouth daily., Disp: 30 capsule, Rfl: 2   glucose blood (TRUE METRIX BLOOD GLUCOSE TEST) test strip, Check BS daily Dx E11.9, Disp: 100 each, Rfl: 3   lovastatin (MEVACOR) 20 MG tablet, Take 1 tablet (20 mg total) by mouth at bedtime., Disp: 90 tablet, Rfl: 3   mupirocin ointment (BACTROBAN) 2 %, Apply 1 Application topically 2 (two) times daily., Disp: 22 g, Rfl: 1   nystatin (MYCOSTATIN/NYSTOP) powder, Apply topically 2 (two) times daily., Disp: 15 g, Rfl: 0   oxyCODONE-acetaminophen (PERCOCET) 10-325 MG tablet, Take 1 tablet by mouth every 6 (six) hours as needed for pain., Disp: , Rfl:    OXYGEN, Inhale 3 L into the lungs continuous., Disp: , Rfl:    OZEMPIC, 0.25 OR 0.5 MG/DOSE, 2 MG/3ML SOPN, Inject 2 mg into the skin once a week., Disp: , Rfl:    potassium chloride SA (KLOR-CON) 20 MEQ tablet, Take 2 tablets (40 mEq total) by mouth 2 (two) times daily. (Patient taking differently: Take 40 mEq by mouth daily.), Disp: 360 tablet, Rfl: 3   torsemide (DEMADEX) 20 MG tablet, Take 2.5 tablets (50 mg total) by mouth 2 (two) times daily., Disp: , Rfl:    TRUEplus Lancets 33G MISC, Check BS daily Dx E11.9, Disp: 100 each, Rfl: 3   venlafaxine XR (EFFEXOR-XR) 150 MG 24 hr capsule, Take 1 capsule (150 mg total) by mouth daily with breakfast., Disp: 30 capsule, Rfl: 2   ferrous sulfate 325 (65 FE) MG tablet, Take 1 tablet (325 mg total) by mouth daily with breakfast., Disp: 30 tablet, Rfl: 3   lenalidomide (REVLIMID) 15 MG capsule, TAKE 1 CAPSULE BY MOUTH EVERY DAY FOR 21 DAYS ON THEN 7 DAYS OFF, Disp: 21 capsule, Rfl: 0   lidocaine-prilocaine (EMLA) cream, Apply 1  Application topically as needed. Apply a small amount to port a cath site and cover with plastic wrap 1 hour prior to infusion appointments, Disp: 30 g, Rfl: 3   prochlorperazine (COMPAZINE) 10 MG tablet, Take 1 tablet (10 mg total) by mouth every 6 (six) hours as needed for nausea or vomiting., Disp: 30 tablet, Rfl: 6 No current facility-administered medications for this visit.  Facility-Administered Medications Ordered in Other Visits:    sodium chloride flush (NS) 0.9 % injection 10 mL, 10 mL, Intravenous, PRN, Pennington, Rebekah M, PA-C, 10 mL at 03/08/22 1344   sodium chloride flush (NS) 0.9 % injection 10 mL, 10 mL, Intravenous, PRN, Doreatha Massed, MD, 10 mL at 10/26/22 1303   Allergies: Allergies  Allergen Reactions   Keflex [Cephalexin] Nausea And Vomiting    REVIEW OF SYSTEMS:   Review of Systems  Constitutional:  Negative for chills, fatigue and fever.  HENT:   Negative for lump/mass, mouth sores, nosebleeds, sore throat and trouble swallowing.   Eyes:  Negative for eye problems.  Respiratory:  Positive for cough. Negative for shortness of breath.   Cardiovascular:  Negative for chest pain, leg swelling and palpitations.  Gastrointestinal:  Negative for abdominal pain, constipation, diarrhea, nausea and vomiting.  Genitourinary:  Negative for bladder incontinence, difficulty urinating, dysuria,  frequency, hematuria and nocturia.   Musculoskeletal:  Negative for arthralgias, back pain, flank pain, myalgias and neck pain.  Skin:  Negative for itching and rash.  Neurological:  Positive for headaches. Negative for dizziness and numbness.  Hematological:  Does not bruise/bleed easily.  Psychiatric/Behavioral:  Negative for depression, sleep disturbance and suicidal ideas. The patient is not nervous/anxious.   All other systems reviewed and are negative.    VITALS:   There were no vitals taken for this visit.  Wt Readings from Last 3 Encounters:  10/10/22 277 lb 6.4 oz  (125.8 kg)  09/26/22 280 lb (127 kg)  09/13/22 279 lb (126.6 kg)    There is no height or weight on file to calculate BMI.  Performance status (ECOG): 1 - Symptomatic but completely ambulatory  PHYSICAL EXAM:   Physical Exam Vitals and nursing note reviewed. Exam conducted with a chaperone present.  Constitutional:      Appearance: Normal appearance.  Cardiovascular:     Rate and Rhythm: Normal rate and regular rhythm.     Pulses: Normal pulses.     Heart sounds: Normal heart sounds.  Pulmonary:     Effort: Pulmonary effort is normal.     Breath sounds: Normal breath sounds.  Abdominal:     Palpations: Abdomen is soft. There is no hepatomegaly, splenomegaly or mass.     Tenderness: There is no abdominal tenderness.  Musculoskeletal:     Right lower leg: No edema.     Left lower leg: No edema.  Lymphadenopathy:     Cervical: No cervical adenopathy.     Right cervical: No superficial, deep or posterior cervical adenopathy.    Left cervical: No superficial, deep or posterior cervical adenopathy.     Upper Body:     Right upper body: No supraclavicular or axillary adenopathy.     Left upper body: No supraclavicular or axillary adenopathy.  Neurological:     General: No focal deficit present.     Mental Status: She is alert and oriented to person, place, and time.  Psychiatric:        Mood and Affect: Mood normal.        Behavior: Behavior normal.     LABS:      Latest Ref Rng & Units 10/26/2022    8:09 AM 10/10/2022    8:19 AM 10/01/2022    5:00 AM  CBC  WBC 4.0 - 10.5 K/uL 4.4  3.8  2.8   Hemoglobin 12.0 - 15.0 g/dL 16.1  9.5  8.8   Hematocrit 36.0 - 46.0 % 35.4  30.8  28.3   Platelets 150 - 400 K/uL 95  103  93       Latest Ref Rng & Units 10/10/2022    8:19 AM 10/03/2022    4:14 AM 10/01/2022    5:00 AM  CMP  Glucose 70 - 99 mg/dL 096   045   BUN 8 - 23 mg/dL 14   13   Creatinine 4.09 - 1.00 mg/dL 8.11  9.14  7.82   Sodium 135 - 145 mmol/L 139   137   Potassium  3.5 - 5.1 mmol/L 3.6   3.6   Chloride 98 - 111 mmol/L 104   101   CO2 22 - 32 mmol/L 27   31   Calcium 8.9 - 10.3 mg/dL 8.3   8.0   Total Protein 6.5 - 8.1 g/dL 5.8     Total Bilirubin 0.3 - 1.2 mg/dL 0.3  Alkaline Phos 38 - 126 U/L 98     AST 15 - 41 U/L 23     ALT 0 - 44 U/L 12        No results found for: "CEA1", "CEA" / No results found for: "CEA1", "CEA" No results found for: "PSA1" No results found for: "YQM578" No results found for: "CAN125"  Lab Results  Component Value Date   TOTALPROTELP 5.1 (L) 08/30/2022   ALBUMINELP 2.9 08/30/2022   A1GS 0.3 08/30/2022   A2GS 0.6 08/30/2022   BETS 0.7 08/30/2022   GAMS 0.6 08/30/2022   MSPIKE 0.3 (H) 08/30/2022   SPEI Comment 08/30/2022   Lab Results  Component Value Date   TIBC 331 10/26/2022   TIBC 338 08/02/2022   TIBC 375 06/07/2022   FERRITIN 156 10/26/2022   FERRITIN 81 08/02/2022   FERRITIN 51 06/07/2022   IRONPCTSAT 20 10/26/2022   IRONPCTSAT 13 08/02/2022   IRONPCTSAT 27 06/07/2022   Lab Results  Component Value Date   LDH 92 (L) 10/22/2021   LDH 101 06/25/2021   LDH 103 03/24/2021     STUDIES:   [place] EKG SITE RITE  Result Date: 10/01/2022 If Site Rite image not attached, placement could not be confirmed due to current cardiac rhythm.  ECHOCARDIOGRAM COMPLETE  Result Date: 09/27/2022    ECHOCARDIOGRAM REPORT   Patient Name:   Savannah Burns Date of Exam: 09/27/2022 Medical Rec #:  [id number]          Height:       62.0 in Accession #:    [id number]         Weight:       280.0 lb Date of Birth:  07/10/1950          BSA:          2.206 m Patient Age:    72 years           BP:           104/54 mmHg Patient Gender: F                  HR:           76 bpm. [facility] Procedure: 2D Echo, Cardiac Doppler and Color Doppler Indications:    Bacteremia R78.81  History:        Patient has prior history of Echocardiogram examinations, most                 recent 06/03/2022. Sepsis; Risk  Factors:Dyslipidemia, Former                 Smoker, Hypertension and Diabetes.  Sonographer:    Aron Baba Referring Phys: [id number] CORNELIUS N VAN DAM  Sonographer Comments: Patient is obese. Image acquisition challenging due to respiratory motion. IMPRESSIONS  1. Left ventricular ejection fraction, by estimation, is 65 to 70%. The left ventricle has normal function. The left ventricle has no regional wall motion abnormalities. There is mild left ventricular hypertrophy. Left ventricular diastolic parameters are consistent with Grade I diastolic dysfunction (impaired relaxation). Elevated left atrial pressure.  2. Right ventricular systolic function is normal. The right ventricular size is normal. There is mildly elevated pulmonary artery systolic pressure.  3. Left atrial size was mildly dilated.  4. Right atrial size was mildly dilated.  5. The mitral valve is abnormal. Mild mitral valve regurgitation.  6. The aortic valve has an indeterminant number of cusps. There is moderate calcification  of the aortic valve. There is moderate thickening of the aortic valve. Aortic valve regurgitation is not visualized. Mild aortic valve stenosis. Aortic valve mean gradient measures 18.0 mmHg. Aortic valve peak gradient measures 35.3 mmHg. Aortic valve area, by VTI measures 1.87 cm.  7. Aortic dilatation noted. There is mild dilatation of the ascending aorta, measuring 40 mm.  8. The inferior vena cava is dilated in size with >50% respiratory variability, suggesting right atrial pressure of 8 mmHg. FINDINGS  Left Ventricle: Left ventricular ejection fraction, by estimation, is 65 to 70%. The left ventricle has normal function. The left ventricle has no regional wall motion abnormalities. The left ventricular internal cavity size was normal in size. There is  mild left ventricular hypertrophy. Left ventricular diastolic parameters are consistent with Grade I diastolic dysfunction (impaired relaxation). Elevated left atrial  pressure. Right Ventricle: The right ventricular size is normal. Right vetricular wall thickness was not well visualized. Right ventricular systolic function is normal. There is mildly elevated pulmonary artery systolic pressure. The tricuspid regurgitant velocity  is 2.41 m/s, and with an assumed right atrial pressure of 15 mmHg, the estimated right ventricular systolic pressure is 38.2 mmHg. Left Atrium: Left atrial size was mildly dilated. Right Atrium: Right atrial size was mildly dilated. Pericardium: There is no evidence of pericardial effusion. Mitral Valve: The mitral valve is abnormal. There is mild thickening of the mitral valve leaflet(s). There is mild calcification of the mitral valve leaflet(s). Mild mitral annular calcification. Mild mitral valve regurgitation. Tricuspid Valve: The tricuspid valve is normal in structure. Tricuspid valve regurgitation is not demonstrated. No evidence of tricuspid stenosis. Aortic Valve: The aortic valve has an indeterminant number of cusps. There is moderate calcification of the aortic valve. There is moderate thickening of the aortic valve. There is moderate aortic valve annular calcification. Aortic valve regurgitation is not visualized. Mild aortic stenosis is present. Aortic valve mean gradient measures 18.0 mmHg. Aortic valve peak gradient measures 35.3 mmHg. Aortic valve area, by VTI measures 1.87 cm. Pulmonic Valve: The pulmonic valve was not well visualized. Pulmonic valve regurgitation is not visualized. No evidence of pulmonic stenosis. Aorta: The aortic root is normal in size and structure and aortic dilatation noted. There is mild dilatation of the ascending aorta, measuring 40 mm. Venous: The inferior vena cava is dilated in size with greater than 50% respiratory variability, suggesting right atrial pressure of 8 mmHg. IAS/Shunts: The interatrial septum was not well visualized.  LEFT VENTRICLE PLAX 2D LVIDd:         4.40 cm   Diastology LVIDs:          3.20 cm   LV e' medial:    5.70 cm/s LV PW:         1.10 cm   LV E/e' medial:  20.7 LV IVS:        1.10 cm   LV e' lateral:   9.81 cm/s LVOT diam:     2.10 cm   LV E/e' lateral: 12.0 LV SV:         108 LV SV Index:   49 LVOT Area:     3.46 cm  RIGHT VENTRICLE RV S prime:     12.90 cm/s TAPSE (M-mode): 2.3 cm LEFT ATRIUM              Index        RIGHT ATRIUM           Index LA diam:  4.30 cm  1.95 cm/m   RA Area:     21.30 cm LA Vol (A2C):   65.1 ml  29.52 ml/m  RA Volume:   68.50 ml  31.06 ml/m LA Vol (A4C):   103.0 ml 46.70 ml/m LA Biplane Vol: 89.5 ml  40.58 ml/m  AORTIC VALVE AV Area (Vmax):    1.77 cm AV Area (Vmean):   1.90 cm AV Area (VTI):     1.87 cm AV Vmax:           297.00 cm/s AV Vmean:          191.333 cm/s AV VTI:            0.575 m AV Peak Grad:      35.3 mmHg AV Mean Grad:      18.0 mmHg LVOT Vmax:         152.00 cm/s LVOT Vmean:        105.000 cm/s LVOT VTI:          0.311 m LVOT/AV VTI ratio: 0.54  AORTA Ao Root diam: 3.60 cm Ao Asc diam:  4.00 cm MITRAL VALVE                TRICUSPID VALVE MV Area (PHT): 2.21 cm     TR Peak grad:   23.2 mmHg MV Decel Time: 343 msec     TR Vmax:        241.00 cm/s MV E velocity: 118.00 cm/s MV A velocity: 156.00 cm/s  SHUNTS MV E/A ratio:  0.76         Systemic VTI:  0.31 m                             Systemic Diam: 2.10 cm Dina Rich MD Electronically signed by Dina Rich MD Signature Date/Time: 09/27/2022/12:47:19 PM    Final    CT CHEST W CONTRAST  Result Date: 09/26/2022 CLINICAL DATA:  Soft tissue infection involving the right axilla and right flank EXAM: CT CHEST WITH CONTRAST TECHNIQUE: Multidetector CT imaging of the chest was performed during intravenous contrast administration. RADIATION DOSE REDUCTION: This exam was performed according to the departmental dose-optimization program which includes automated exposure control, adjustment of the mA and/or kV according to patient size and/or use of iterative reconstruction  technique. CONTRAST:  75mL OMNIPAQUE IOHEXOL 300 MG/ML  SOLN COMPARISON:  PET-CT dated 11/18/2021 FINDINGS: Cardiovascular: The heart is normal in size. No pericardial effusion. No evidence of thoracic aortic aneurysm. Atherosclerotic calcifications of the aortic arch. Moderate three-vessel coronary atherosclerosis. Right chest port terminates at the cavoatrial junction. Mediastinum/Nodes: No suspicious mediastinal lymphadenopathy. Visualized thyroid is unremarkable. Lungs/Pleura: Evaluation lung parenchyma is constrained by respiratory motion. Within that constraint, there are patchy/ground-glass opacities in the lateral upper lobes (series 5/image 50), posterior right upper lobe along the major fissure (series 5/image 54) and anterior might middle lobe adjacent to the minor fissure (series 5/image 69) which are new from the prior and favor mild infection/pneumonia. Mild subpleural scarring/atelectasis in the lingula, chronic. Mild dependent atelectasis in the bilateral lower lobes. Numerous hyperdense foci in the posterior lower lobes, favored sequela of prior aspiration. No pleural effusion or pneumothorax. Upper Abdomen: Visualized upper abdomen is notable for a nodular hepatic border, suggesting cirrhosis. Stable 3.2 cm left adrenal nodule, without hypermetabolism on PET, favoring a benign adrenal adenoma. Musculoskeletal: Cutaneous thickening along the right lateral chest wall ((series 2/image 81) with mild subcutaneous extending to  the right axilla, where there is focal subcutaneous stranding/inflammation (series 2/image 44), suggesting cellulitis. However, there is no drainable fluid collection/abscess. Degenerative changes of the visualized thoracolumbar spine. No focal lytic lesions in this patient with history of multiple myeloma. IMPRESSION: Suspected cellulitis along the right axilla/lateral chest wall. No drainable fluid collection/abscess. Scattered mild patchy/ground-glass opacities in the lungs  bilaterally, as described above, new from prior PET. Although poorly evaluated due to motion degradation, this appearance favors mild infection/pneumonia. Additional stable ancillary findings as above. Aortic Atherosclerosis (ICD10-I70.0). Electronically Signed   By: Charline Bills M.D.   On: 09/26/2022 22:00

## 2022-10-26 ENCOUNTER — Inpatient Hospital Stay: Payer: Medicare PPO

## 2022-10-26 ENCOUNTER — Inpatient Hospital Stay (HOSPITAL_BASED_OUTPATIENT_CLINIC_OR_DEPARTMENT_OTHER): Payer: Medicare PPO | Admitting: Hematology

## 2022-10-26 VITALS — BP 160/73 | HR 74 | Temp 97.7°F | Resp 20

## 2022-10-26 VITALS — BP 139/99 | HR 82 | Temp 98.8°F | Resp 18

## 2022-10-26 DIAGNOSIS — I700 Atherosclerosis of aorta: Secondary | ICD-10-CM | POA: Diagnosis not present

## 2022-10-26 DIAGNOSIS — D500 Iron deficiency anemia secondary to blood loss (chronic): Secondary | ICD-10-CM

## 2022-10-26 DIAGNOSIS — D509 Iron deficiency anemia, unspecified: Secondary | ICD-10-CM | POA: Diagnosis not present

## 2022-10-26 DIAGNOSIS — C9000 Multiple myeloma not having achieved remission: Secondary | ICD-10-CM

## 2022-10-26 DIAGNOSIS — Z79624 Long term (current) use of inhibitors of nucleotide synthesis: Secondary | ICD-10-CM | POA: Diagnosis not present

## 2022-10-26 DIAGNOSIS — M8580 Other specified disorders of bone density and structure, unspecified site: Secondary | ICD-10-CM | POA: Diagnosis not present

## 2022-10-26 DIAGNOSIS — K7460 Unspecified cirrhosis of liver: Secondary | ICD-10-CM | POA: Diagnosis not present

## 2022-10-26 DIAGNOSIS — E1142 Type 2 diabetes mellitus with diabetic polyneuropathy: Secondary | ICD-10-CM | POA: Diagnosis not present

## 2022-10-26 DIAGNOSIS — Z79899 Other long term (current) drug therapy: Secondary | ICD-10-CM | POA: Diagnosis not present

## 2022-10-26 DIAGNOSIS — I2510 Atherosclerotic heart disease of native coronary artery without angina pectoris: Secondary | ICD-10-CM | POA: Diagnosis not present

## 2022-10-26 LAB — CBC WITH DIFFERENTIAL/PLATELET
Abs Immature Granulocytes: 0.01 10*3/uL (ref 0.00–0.07)
Basophils Absolute: 0 10*3/uL (ref 0.0–0.1)
Basophils Relative: 0 %
Eosinophils Absolute: 0.1 10*3/uL (ref 0.0–0.5)
Eosinophils Relative: 2 %
HCT: 35.4 % — ABNORMAL LOW (ref 36.0–46.0)
Hemoglobin: 11.1 g/dL — ABNORMAL LOW (ref 12.0–15.0)
Immature Granulocytes: 0 %
Lymphocytes Relative: 37 %
Lymphs Abs: 1.6 10*3/uL (ref 0.7–4.0)
MCH: 32 pg (ref 26.0–34.0)
MCHC: 31.4 g/dL (ref 30.0–36.0)
MCV: 102 fL — ABNORMAL HIGH (ref 80.0–100.0)
Monocytes Absolute: 0.5 10*3/uL (ref 0.1–1.0)
Monocytes Relative: 11 %
Neutro Abs: 2.2 10*3/uL (ref 1.7–7.7)
Neutrophils Relative %: 50 %
Platelets: 95 10*3/uL — ABNORMAL LOW (ref 150–400)
RBC: 3.47 MIL/uL — ABNORMAL LOW (ref 3.87–5.11)
RDW: 17.2 % — ABNORMAL HIGH (ref 11.5–15.5)
WBC: 4.4 10*3/uL (ref 4.0–10.5)
nRBC: 0 % (ref 0.0–0.2)

## 2022-10-26 LAB — FERRITIN: Ferritin: 156 ng/mL (ref 11–307)

## 2022-10-26 LAB — IRON AND TIBC
Iron: 67 ug/dL (ref 28–170)
Saturation Ratios: 20 % (ref 10.4–31.8)
TIBC: 331 ug/dL (ref 250–450)
UIBC: 264 ug/dL

## 2022-10-26 LAB — SAMPLE TO BLOOD BANK

## 2022-10-26 MED ORDER — HEPARIN SOD (PORK) LOCK FLUSH 100 UNIT/ML IV SOLN
250.0000 [IU] | Freq: Once | INTRAVENOUS | Status: AC
  Administered 2022-10-26: 250 [IU] via INTRAVENOUS

## 2022-10-26 MED ORDER — SODIUM CHLORIDE 0.9% FLUSH
10.0000 mL | Freq: Once | INTRAVENOUS | Status: AC
  Administered 2022-10-26: 10 mL via INTRAVENOUS

## 2022-10-26 MED ORDER — SODIUM CHLORIDE 0.9 % IV SOLN: Freq: Once | INTRAVENOUS | Status: AC

## 2022-10-26 MED ORDER — SODIUM CHLORIDE 0.9% FLUSH
10.0000 mL | INTRAVENOUS | Status: DC | PRN
  Administered 2022-10-26: 10 mL via INTRAVENOUS

## 2022-10-26 MED ORDER — SODIUM CHLORIDE 0.9 % IV SOLN
500.0000 mg | Freq: Once | INTRAVENOUS | Status: AC
  Administered 2022-10-26: 500 mg via INTRAVENOUS
  Filled 2022-10-26: qty 5

## 2022-10-26 NOTE — Patient Instructions (Addendum)
[facility] at [facility] Discharge Instructions   You were seen and examined today by Dr. Ellin Saba.  He reviewed the results of your lab work which are normal/stable.   We will hold your treatment today and resume tx next week.   Return as scheduled.    Thank you for choosing [facility] to provide your oncology and hematology care.  To afford each patient quality time with our provider, please arrive at least 15 minutes before your scheduled appointment time.   If you have a lab appointment with the Cancer Center please come in thru the Main Entrance and check in at the main information desk.  You need to re-schedule your appointment should you arrive 10 or more minutes late.  We strive to give you quality time with our providers, and arriving late affects you and other patients whose appointments are after yours.  Also, if you no show three or more times for appointments you may be dismissed from the clinic at the providers discretion.     Again, thank you for choosing [facility].  Our hope is that these requests will decrease the amount of time that you wait before being seen by our physicians.       _____________________________________________________________  Should you have questions after your visit to [facility], please contact our office at [phone] and follow the prompts.  Our office hours are 8:00 a.m. and 4:30 p.m. Monday - Friday.  Please note that voicemails left after 4:00 p.m. may not be returned until the following business day.  We are closed weekends and major holidays.  You do have access to a nurse 24-7, just call the main number to the clinic [phone] and do not press any options, hold on the line and a nurse will answer the phone.    For prescription refill requests, have your pharmacy contact our office and allow 72 hours.    Due to Covid, you will need to wear a mask  upon entering the hospital. If you do not have a mask, a mask will be given to you at the Main Entrance upon arrival. For doctor visits, patients may have 1 support person age 73 or older with them. For treatment visits, patients can not have anyone with them due to social distancing guidelines and our immunocompromised population.

## 2022-10-26 NOTE — Patient Instructions (Signed)
[facility]  Discharge Instructions: Thank you for choosing [facility] to provide your oncology and hematology care.  If you have a lab appointment with the Cancer Center - please note that after April 8th, 2024, all labs will be drawn in the cancer center.  You do not have to check in or register with the main entrance as you have in the past but will complete your check-in in the cancer center.  Wear comfortable clothing and clothing appropriate for easy access to any Portacath or PICC line.   We strive to give you quality time with your provider. You may need to reschedule your appointment if you arrive late (15 or more minutes).  Arriving late affects you and other patients whose appointments are after yours.  Also, if you miss three or more appointments without notifying the office, you may be dismissed from the clinic at the provider's discretion.      For prescription refill requests, have your pharmacy contact our office and allow 72 hours for refills to be completed.    Today you received Venofer 500 mg IV iron infusion.     BELOW ARE SYMPTOMS THAT SHOULD BE REPORTED IMMEDIATELY: *FEVER GREATER THAN 100.4 F (38 C) OR HIGHER *CHILLS OR SWEATING *NAUSEA AND VOMITING THAT IS NOT CONTROLLED WITH YOUR NAUSEA MEDICATION *UNUSUAL SHORTNESS OF BREATH *UNUSUAL BRUISING OR BLEEDING *URINARY PROBLEMS (pain or burning when urinating, or frequent urination) *BOWEL PROBLEMS (unusual diarrhea, constipation, pain near the anus) TENDERNESS IN MOUTH AND THROAT WITH OR WITHOUT PRESENCE OF ULCERS (sore throat, sores in mouth, or a toothache) UNUSUAL RASH, SWELLING OR PAIN  UNUSUAL VAGINAL DISCHARGE OR ITCHING   Items with * indicate a potential emergency and should be followed up as soon as possible or go to the Emergency Department if any problems should occur.  Please show the CHEMOTHERAPY ALERT CARD or IMMUNOTHERAPY ALERT CARD at check-in to the Emergency  Department and triage nurse.  Should you have questions after your visit or need to cancel or reschedule your appointment, please contact [facility] [place] [phone]  and follow the prompts.  Office hours are 8:00 a.m. to 4:30 p.m. Monday - Friday. Please note that voicemails left after 4:00 p.m. may not be returned until the following business day.  We are closed weekends and major holidays. You have access to a nurse at all times for urgent questions. Please call the main number to the clinic [phone] and follow the prompts.  For any non-urgent questions, you may also contact your provider using MyChart. We now offer e-Visits for anyone 18 and older to request care online for non-urgent symptoms. For details visit [email or web address].   Also download the MyChart app! Go to the app store, search "MyChart", open the app, select [name], and log in with your MyChart username and password.   

## 2022-10-26 NOTE — Progress Notes (Signed)
Patient presents today for Dara  infusion. Patient is in satisfactory condition with no new complaints voiced.  Vital signs are stable.  Labs reviewed by Dr. Ellin Saba during the office visit and all labs are within treatment parameters. Patient will only receive Venofer today. Patient will resume Dara and Revlimid next week per Dr.K.  Treatment given today per MD orders. Tolerated infusion without adverse affects. Vital signs stable. No complaints at this time. Discharged from clinic via wheelchair in stable condition. Alert and oriented x 3. F/U with [facility] as scheduled.

## 2022-10-26 NOTE — Progress Notes (Signed)
Savannah Burns presented for PICC flush and labs.  See IV assessment in docflowsheets for PICC details.  Proper placement of PICC confirmed by CXR.  PICC located left arm.  Good blood return present. PICC flushed with 20ml NS and 250U Heparin, see MAR for further details.  Savannah Burns tolerated procedure well and without incident.

## 2022-10-27 ENCOUNTER — Other Ambulatory Visit: Payer: Self-pay

## 2022-10-27 DIAGNOSIS — Z79899 Other long term (current) drug therapy: Secondary | ICD-10-CM | POA: Diagnosis not present

## 2022-10-27 DIAGNOSIS — Z5112 Encounter for antineoplastic immunotherapy: Secondary | ICD-10-CM | POA: Diagnosis not present

## 2022-10-27 DIAGNOSIS — C9000 Multiple myeloma not having achieved remission: Secondary | ICD-10-CM | POA: Diagnosis not present

## 2022-10-27 DIAGNOSIS — D509 Iron deficiency anemia, unspecified: Secondary | ICD-10-CM | POA: Insufficient documentation

## 2022-10-27 DIAGNOSIS — Z95828 Presence of other vascular implants and grafts: Secondary | ICD-10-CM

## 2022-10-29 ENCOUNTER — Encounter: Payer: Self-pay | Admitting: Infectious Diseases

## 2022-10-29 NOTE — Progress Notes (Signed)
Notified by Savannah Burns with  Home Health services that oncology requested to keep PICC line in place for now so she can proceed with chemotherapy. Reviewed Dr. Marice Potter notes and plan to defer port replacement for another 4 weeks.  Oncology will be writing further orders for PICC line care / use including appropriate time frame to have this discontinued.   She will FU with Dr.Snider virtually on 11/09/2022 to see how she is doing following antibiotic stop.    Savannah Alberts, MSN, NP-C [facility]  [email or web address] Pager: [phone] Office: [phone] [facility] Main Line: [phone] *Secure Chat Communication Welcome

## 2022-11-02 ENCOUNTER — Inpatient Hospital Stay: Payer: Medicare PPO | Attending: Hematology

## 2022-11-02 ENCOUNTER — Inpatient Hospital Stay: Payer: Medicare PPO

## 2022-11-02 VITALS — BP 151/86 | HR 71 | Temp 97.6°F | Resp 20

## 2022-11-02 DIAGNOSIS — Z95828 Presence of other vascular implants and grafts: Secondary | ICD-10-CM

## 2022-11-02 DIAGNOSIS — D509 Iron deficiency anemia, unspecified: Secondary | ICD-10-CM | POA: Diagnosis not present

## 2022-11-02 DIAGNOSIS — C9000 Multiple myeloma not having achieved remission: Secondary | ICD-10-CM

## 2022-11-02 DIAGNOSIS — Z5112 Encounter for antineoplastic immunotherapy: Secondary | ICD-10-CM | POA: Diagnosis not present

## 2022-11-02 DIAGNOSIS — Z79899 Other long term (current) drug therapy: Secondary | ICD-10-CM | POA: Diagnosis not present

## 2022-11-02 LAB — MAGNESIUM: Magnesium: 2 mg/dL (ref 1.7–2.4)

## 2022-11-02 LAB — CBC WITH DIFFERENTIAL/PLATELET
Abs Immature Granulocytes: 0.01 10*3/uL (ref 0.00–0.07)
Basophils Absolute: 0 10*3/uL (ref 0.0–0.1)
Basophils Relative: 0 %
Eosinophils Absolute: 0 10*3/uL (ref 0.0–0.5)
Eosinophils Relative: 1 %
HCT: 37.7 % (ref 36.0–46.0)
Hemoglobin: 11.9 g/dL — ABNORMAL LOW (ref 12.0–15.0)
Immature Granulocytes: 0 %
Lymphocytes Relative: 33 %
Lymphs Abs: 1.5 10*3/uL (ref 0.7–4.0)
MCH: 32.1 pg (ref 26.0–34.0)
MCHC: 31.6 g/dL (ref 30.0–36.0)
MCV: 101.6 fL — ABNORMAL HIGH (ref 80.0–100.0)
Monocytes Absolute: 0.5 10*3/uL (ref 0.1–1.0)
Monocytes Relative: 10 %
Neutro Abs: 2.5 10*3/uL (ref 1.7–7.7)
Neutrophils Relative %: 56 %
Platelets: 94 10*3/uL — ABNORMAL LOW (ref 150–400)
RBC: 3.71 MIL/uL — ABNORMAL LOW (ref 3.87–5.11)
RDW: 16.4 % — ABNORMAL HIGH (ref 11.5–15.5)
WBC: 4.5 10*3/uL (ref 4.0–10.5)
nRBC: 0 % (ref 0.0–0.2)

## 2022-11-02 LAB — COMPREHENSIVE METABOLIC PANEL
ALT: 12 U/L (ref 0–44)
AST: 15 U/L (ref 15–41)
Albumin: 3.5 g/dL (ref 3.5–5.0)
Alkaline Phosphatase: 116 U/L (ref 38–126)
Anion gap: 8 (ref 5–15)
BUN: 12 mg/dL (ref 8–23)
CO2: 30 mmol/L (ref 22–32)
Calcium: 8.6 mg/dL — ABNORMAL LOW (ref 8.9–10.3)
Chloride: 101 mmol/L (ref 98–111)
Creatinine, Ser: 0.55 mg/dL (ref 0.44–1.00)
GFR, Estimated: >60 mL/min (ref >60)
Glucose, Bld: 145 mg/dL — ABNORMAL HIGH (ref 70–99)
Potassium: 3.7 mmol/L (ref 3.5–5.1)
Sodium: 139 mmol/L (ref 135–145)
Total Bilirubin: 0.5 mg/dL (ref 0.3–1.2)
Total Protein: 6.2 g/dL — ABNORMAL LOW (ref 6.5–8.1)

## 2022-11-02 MED ORDER — ACETAMINOPHEN 325 MG PO TABS: 650.0000 mg | ORAL_TABLET | Freq: Once | ORAL | Status: DC

## 2022-11-02 MED ORDER — DARATUMUMAB-HYALURONIDASE-FIHJ 1800-30000 MG-UT/15ML ~~LOC~~ SOLN: 1800.0000 mg | Freq: Once | SUBCUTANEOUS | Status: DC

## 2022-11-02 MED ORDER — DIPHENHYDRAMINE HCL 25 MG PO CAPS
50.0000 mg | ORAL_CAPSULE | Freq: Once | ORAL | Status: AC
  Administered 2022-11-02: 50 mg via ORAL
  Filled 2022-11-02: qty 2

## 2022-11-02 MED ORDER — DARATUMUMAB-HYALURONIDASE-FIHJ 1800-30000 MG-UT/15ML ~~LOC~~ SOLN
1800.0000 mg | Freq: Once | SUBCUTANEOUS | Status: AC
  Administered 2022-11-02: 1800 mg via SUBCUTANEOUS
  Filled 2022-11-02: qty 15

## 2022-11-02 MED ORDER — DIPHENHYDRAMINE HCL 25 MG PO CAPS: 50.0000 mg | ORAL_CAPSULE | Freq: Once | ORAL | Status: DC

## 2022-11-02 MED ORDER — ACETAMINOPHEN 325 MG PO TABS
650.0000 mg | ORAL_TABLET | Freq: Once | ORAL | Status: AC
  Administered 2022-11-02: 650 mg via ORAL
  Filled 2022-11-02: qty 2

## 2022-11-02 NOTE — Progress Notes (Signed)
Patient did not take premeds from home for today's treatment.   Platelets 94 today with verbal order ok to treat and will hold for Platelets 30 and under per Dr. Ellin Saba.   Patient tolerated Daratumumab injection with no complaints voiced.  See MAR for details.  Labs reviewed. Injection site clean and dry with no bruising or swelling noted at site.  Band aid applied.  Vss with discharge and left in satisfactory condition with no s/s of distress noted.

## 2022-11-02 NOTE — Progress Notes (Signed)
Updated future treatment parameters to hold if <30,000 platelets  T.O. Dr Carilyn Goodpasture, PharmD

## 2022-11-02 NOTE — Patient Instructions (Signed)
[facility]  Discharge Instructions: Thank you for choosing [facility] to provide your oncology and hematology care.  If you have a lab appointment with the Cancer Center - please note that after April 8th, 2024, all labs will be drawn in the cancer center.  You do not have to check in or register with the main entrance as you have in the past but will complete your check-in in the cancer center.  Wear comfortable clothing and clothing appropriate for easy access to any Portacath or PICC line.   We strive to give you quality time with your provider. You may need to reschedule your appointment if you arrive late (15 or more minutes).  Arriving late affects you and other patients whose appointments are after yours.  Also, if you miss three or more appointments without notifying the office, you may be dismissed from the clinic at the provider's discretion.      For prescription refill requests, have your pharmacy contact our office and allow 72 hours for refills to be completed.    Today you received the following chemotherapy and/or immunotherapy agents darzalex.        To help prevent nausea and vomiting after your treatment, we encourage you to take your nausea medication as directed.  BELOW ARE SYMPTOMS THAT SHOULD BE REPORTED IMMEDIATELY: *FEVER GREATER THAN 100.4 F (38 C) OR HIGHER *CHILLS OR SWEATING *NAUSEA AND VOMITING THAT IS NOT CONTROLLED WITH YOUR NAUSEA MEDICATION *UNUSUAL SHORTNESS OF BREATH *UNUSUAL BRUISING OR BLEEDING *URINARY PROBLEMS (pain or burning when urinating, or frequent urination) *BOWEL PROBLEMS (unusual diarrhea, constipation, pain near the anus) TENDERNESS IN MOUTH AND THROAT WITH OR WITHOUT PRESENCE OF ULCERS (sore throat, sores in mouth, or a toothache) UNUSUAL RASH, SWELLING OR PAIN  UNUSUAL VAGINAL DISCHARGE OR ITCHING   Items with * indicate a potential emergency and should be followed up as soon as possible or go to  the Emergency Department if any problems should occur.  Please show the CHEMOTHERAPY ALERT CARD or IMMUNOTHERAPY ALERT CARD at check-in to the Emergency Department and triage nurse.  Should you have questions after your visit or need to cancel or reschedule your appointment, please contact [facility] [phone]  and follow the prompts.  Office hours are 8:00 a.m. to 4:30 p.m. Monday - Friday. Please note that voicemails left after 4:00 p.m. may not be returned until the following business day.  We are closed weekends and major holidays. You have access to a nurse at all times for urgent questions. Please call the main number to the clinic [phone] and follow the prompts.  For any non-urgent questions, you may also contact your provider using MyChart. We now offer e-Visits for anyone 33 and older to request care online for non-urgent symptoms. For details visit [email or web address].   Also download the MyChart app! Go to the app store, search "MyChart", open the app, select [place], and log in with your MyChart username and password.

## 2022-11-02 NOTE — Progress Notes (Signed)
[place] G Dimarzo presented for PICC flush and dressing change.  See IV assessment in docflowsheets for PICC details.  Proper placement of PICC confirmed by CXR.  PICC located left arm.  Good blood return present. PICC flushed with 20ml NS.  [place] G Langan tolerated procedure well and without incident.

## 2022-11-04 DIAGNOSIS — L03111 Cellulitis of right axilla: Secondary | ICD-10-CM | POA: Diagnosis not present

## 2022-11-04 DIAGNOSIS — A419 Sepsis, unspecified organism: Secondary | ICD-10-CM | POA: Diagnosis not present

## 2022-11-04 DIAGNOSIS — C9000 Multiple myeloma not having achieved remission: Secondary | ICD-10-CM | POA: Diagnosis not present

## 2022-11-04 DIAGNOSIS — I119 Hypertensive heart disease without heart failure: Secondary | ICD-10-CM | POA: Diagnosis not present

## 2022-11-04 DIAGNOSIS — L03313 Cellulitis of chest wall: Secondary | ICD-10-CM | POA: Diagnosis not present

## 2022-11-04 DIAGNOSIS — T80212A Local infection due to central venous catheter, initial encounter: Secondary | ICD-10-CM | POA: Diagnosis not present

## 2022-11-04 DIAGNOSIS — D630 Anemia in neoplastic disease: Secondary | ICD-10-CM | POA: Diagnosis not present

## 2022-11-04 DIAGNOSIS — E119 Type 2 diabetes mellitus without complications: Secondary | ICD-10-CM | POA: Diagnosis not present

## 2022-11-04 DIAGNOSIS — L02411 Cutaneous abscess of right axilla: Secondary | ICD-10-CM | POA: Diagnosis not present

## 2022-11-07 ENCOUNTER — Encounter: Payer: Self-pay | Admitting: Family Medicine

## 2022-11-09 ENCOUNTER — Inpatient Hospital Stay: Payer: Medicare PPO

## 2022-11-09 ENCOUNTER — Encounter: Payer: Self-pay | Admitting: Internal Medicine

## 2022-11-09 ENCOUNTER — Telehealth (INDEPENDENT_AMBULATORY_CARE_PROVIDER_SITE_OTHER): Payer: Medicare PPO | Admitting: Internal Medicine

## 2022-11-09 ENCOUNTER — Other Ambulatory Visit: Payer: Self-pay

## 2022-11-09 VITALS — BP 143/62 | HR 79 | Temp 98.1°F | Resp 18

## 2022-11-09 DIAGNOSIS — D500 Iron deficiency anemia secondary to blood loss (chronic): Secondary | ICD-10-CM

## 2022-11-09 DIAGNOSIS — D509 Iron deficiency anemia, unspecified: Secondary | ICD-10-CM | POA: Diagnosis not present

## 2022-11-09 DIAGNOSIS — Z5112 Encounter for antineoplastic immunotherapy: Secondary | ICD-10-CM | POA: Diagnosis not present

## 2022-11-09 DIAGNOSIS — C9000 Multiple myeloma not having achieved remission: Secondary | ICD-10-CM | POA: Diagnosis not present

## 2022-11-09 DIAGNOSIS — R7881 Bacteremia: Secondary | ICD-10-CM | POA: Diagnosis not present

## 2022-11-09 DIAGNOSIS — B9561 Methicillin susceptible Staphylococcus aureus infection as the cause of diseases classified elsewhere: Secondary | ICD-10-CM

## 2022-11-09 DIAGNOSIS — Z79899 Other long term (current) drug therapy: Secondary | ICD-10-CM | POA: Diagnosis not present

## 2022-11-09 MED ORDER — SODIUM CHLORIDE 0.9% FLUSH
10.0000 mL | INTRAVENOUS | Status: DC | PRN
  Administered 2022-11-09: 10 mL via INTRAVENOUS

## 2022-11-09 MED ORDER — SODIUM CHLORIDE 0.9 % IV SOLN: Freq: Once | INTRAVENOUS | Status: AC

## 2022-11-09 MED ORDER — SODIUM CHLORIDE 0.9 % IV SOLN
500.0000 mg | Freq: Once | INTRAVENOUS | Status: AC
  Administered 2022-11-09: 500 mg via INTRAVENOUS
  Filled 2022-11-09: qty 25

## 2022-11-09 MED ORDER — HEPARIN SOD (PORK) LOCK FLUSH 100 UNIT/ML IV SOLN
500.0000 [IU] | Freq: Once | INTRAVENOUS | Status: AC
  Administered 2022-11-09: 500 [IU] via INTRAVENOUS

## 2022-11-09 MED ORDER — CYANOCOBALAMIN 1000 MCG/ML IJ SOLN
1000.0000 ug | Freq: Once | INTRAMUSCULAR | Status: AC
  Administered 2022-11-09: 1000 ug via INTRAMUSCULAR
  Filled 2022-11-09: qty 1

## 2022-11-09 NOTE — Progress Notes (Signed)
Patient presents today for iron infusion and B12 injection.  Patient is in satisfactory condition with no new complaints voiced.  Vital signs are stable.  We will proceed with infusion per provider orders.      Patient took pre-med Claritin at home prior to arrival.  [name] presented for PICC line flush and dressing change.Proper placement of PICC confirmed by CXR. PICC line located in the left antecubital. Clean, Dry, and Intact Good blood return present. PICC line flushed with 10ml NS and 250 Heparin per protocol. Procedure without incident. Patient tolerated procedure well.  Treatment given today per MD orders. Tolerated infusion without adverse affects. Vital signs stable. No complaints at this time. Discharged from clinic via wheelchair in stable condition. Alert and oriented x 3. F/U with [facility] as scheduled.

## 2022-11-09 NOTE — Progress Notes (Signed)
Virtual Visit via Telephone Note  I connected with [name] on 11/09/22 at  3:15 PM EDT by telephone and verified that I am speaking with the correct person using two identifiers.  Location: Patient: at home Provider: in clinic   I discussed the limitations, risks, security and privacy concerns of performing an evaluation and management service by telephone and the availability of in person appointments. I also discussed with the patient that there may be a patient responsible charge related to this service. The patient expressed understanding and agreed to proceed.   History of Present Illness: hx of mssa bacteremia associated with portacath that is now removed. Has completed 4 wk of cefazolin. Doing well but  still with picc line- due to need chemo. No difficulty with picc line. Cancer center does weekly dressing changes     Outpatient Encounter Medications as of 11/09/2022  Medication Sig   acyclovir (ZOVIRAX) 400 MG tablet Take 1 tablet (400 mg total) by mouth 2 (two) times daily.   baclofen (LIORESAL) 10 MG tablet Take 10 mg by mouth 2 (two) times daily.   Blood Glucose Calibration (TRUE METRIX LEVEL 1) Low SOLN Use w/ glucose monitor Dx E11.9   Blood Glucose Monitoring Suppl (TRUE METRIX AIR GLUCOSE METER) w/Device KIT Check BS daily Dx E11.9   Cholecalciferol (VITAMIN D) 50 MCG (2000 UT) tablet Take 2,000 Units by mouth daily.   clobetasol (TEMOVATE) 0.05 % external solution Apply 1 Application topically 2 (two) times daily.   Cyanocobalamin (B-12 COMPLIANCE INJECTION) 1000 MCG/ML KIT Inject 1,000 mcg as directed every 30 (thirty) days.   dexamethasone (DECADRON) 4 MG tablet Take 5 tablets (20 mg total) by mouth once a week.   esomeprazole (NEXIUM) 40 MG capsule Take 1 capsule (40 mg total) by mouth daily.   glucose blood (TRUE METRIX BLOOD GLUCOSE TEST) test strip Check BS daily Dx E11.9   lenalidomide (REVLIMID) 15 MG capsule TAKE 1 CAPSULE BY MOUTH EVERY DAY FOR 21  DAYS ON THEN 7 DAYS OFF   lidocaine-prilocaine (EMLA) cream Apply 1 Application topically as needed. Apply a small amount to port a cath site and cover with plastic wrap 1 hour prior to infusion appointments   lovastatin (MEVACOR) 20 MG tablet Take 1 tablet (20 mg total) by mouth at bedtime.   mupirocin ointment (BACTROBAN) 2 % Apply 1 Application topically 2 (two) times daily.   nystatin (MYCOSTATIN/NYSTOP) powder Apply topically 2 (two) times daily.   oxyCODONE-acetaminophen (PERCOCET) 10-325 MG tablet Take 1 tablet by mouth every 6 (six) hours as needed for pain.   OXYGEN Inhale 3 L into the lungs continuous.   OZEMPIC, 0.25 OR 0.5 MG/DOSE, 2 MG/3ML SOPN Inject 2 mg into the skin once a week.   potassium chloride SA (KLOR-CON) 20 MEQ tablet Take 2 tablets (40 mEq total) by mouth 2 (two) times daily. (Patient taking differently: Take 40 mEq by mouth daily.)   prochlorperazine (COMPAZINE) 10 MG tablet Take 1 tablet (10 mg total) by mouth every 6 (six) hours as needed for nausea or vomiting.   torsemide (DEMADEX) 20 MG tablet Take 2.5 tablets (50 mg total) by mouth 2 (two) times daily.   TRUEplus Lancets 33G MISC Check BS daily Dx E11.9   venlafaxine XR (EFFEXOR-XR) 150 MG 24 hr capsule Take 1 capsule (150 mg total) by mouth daily with breakfast.   ferrous sulfate 325 (65 FE) MG tablet Take 1 tablet (325 mg total) by mouth daily with breakfast.   [DISCONTINUED] albuterol (  PROVENTIL HFA;VENTOLIN HFA) 108 (90 Base) MCG/ACT inhaler Inhale 2 puffs into the lungs every 6 (six) hours as needed for wheezing or shortness of breath.   Facility-Administered Encounter Medications as of 11/09/2022  Medication   [COMPLETED] 0.9 %  sodium chloride infusion   [COMPLETED] cyanocobalamin (VITAMIN B12) injection 1,000 mcg   [COMPLETED] iron sucrose (VENOFER) 500 mg in sodium chloride 0.9 % 250 mL IVPB   sodium chloride flush (NS) 0.9 % injection 10 mL     Patient Active Problem List   Diagnosis Date Noted    Long term (current) use of antibiotics 10/25/2022   Gastroesophageal reflux disease without esophagitis 10/03/2022   Port or reservoir infection 09/28/2022   Axillary abscess 09/28/2022   Staphylococcus aureus bacteremia 09/27/2022   MSSA bacteremia 09/27/2022   Obesity, Class III, BMI 40-49.9 (morbid obesity) (HCC) 09/26/2022   Chronic respiratory failure with hypoxia (HCC) 09/26/2022   Thrombocytopenia (HCC) 09/26/2022   Cellulitis of axilla, right 09/26/2022   Diabetic peripheral neuropathy associated with type 2 diabetes mellitus (HCC) 02/25/2022   Multiple myeloma (HCC) 11/30/2021   H. pylori infection 08/12/2021   Cirrhosis of liver (HCC) 08/04/2021   Hypoalbuminemia 05/17/2020   Moderate protein malnutrition (HCC) 05/17/2020   Melena 05/16/2020   Hyperlipidemia    Hypokalemia    GI bleed 03/20/2020   Lower GI bleed 03/19/2020   Acute on chronic congestive heart failure (HCC)    Symptomatic anemia    Iron deficiency anemia    Heme positive stool    ABLA (acute blood loss anemia) 01/22/2020   Supplemental oxygen dependent 10/22/2019   Chronic dyspnea 10/22/2019   Hypertension associated with diabetes (HCC) 10/22/2019   Bilateral hand pain 09/21/2016   Fatigue 07/30/2015   Chronic diastolic HF (heart failure) (HCC) 06/29/2015   Postmenopausal bleeding 04/17/2015   Excessive daytime sleepiness 12/19/2014   Swelling of lower extremity 11/27/2014   Back pain 06/13/2013   Sinusitis, chronic 05/30/2012   Allergic rhinitis 06/06/2011   Hyperlipidemia associated with type 2 diabetes mellitus (HCC) 03/24/2009   Diabetes mellitus, type 2 (HCC) 08/12/2008   Pulmonary nodule 08/29/2007   COPD (chronic obstructive pulmonary disease) (HCC) 02/14/2007   Class 3 obesity (HCC) 05/25/2006   Depression, recurrent (HCC) 05/25/2006   HYPERTENSION, BENIGN SYSTEMIC 05/25/2006   DJD, UNSPECIFIED 05/25/2006     Health Maintenance Due  Topic Date Due   Zoster Vaccines- Shingrix (1 of 2)  09/12/1968   Diabetic kidney evaluation - Urine ACR  03/01/2018   COVID-19 Vaccine (2 - Moderna risk series) 07/10/2019   OPHTHALMOLOGY EXAM  08/06/2021   FOOT EXAM  09/04/2021   Medicare Annual Wellness (AWV)  07/28/2022     Review of Systems + 12 point ros is otherwise is negative Physical Exam   Gen = fluid speech  CBC Lab Results  Component Value Date   WBC 4.5 11/02/2022   RBC 3.71 (L) 11/02/2022   HGB 11.9 (L) 11/02/2022   HCT 37.7 11/02/2022   PLT 94 (L) 11/02/2022   MCV 101.6 (H) 11/02/2022   MCH 32.1 11/02/2022   MCHC 31.6 11/02/2022   RDW 16.4 (H) 11/02/2022   LYMPHSABS 1.5 11/02/2022   MONOABS 0.5 11/02/2022   EOSABS 0.0 11/02/2022    BMET Lab Results  Component Value Date   NA 139 11/02/2022   K 3.7 11/02/2022   CL 101 11/02/2022   CO2 30 11/02/2022   GLUCOSE 145 (H) 11/02/2022   BUN 12 11/02/2022   CREATININE 0.55 11/02/2022  CALCIUM 8.6 (L) 11/02/2022   GFRNONAA >60 11/02/2022   GFRAA 96 02/07/2020    Follow Up Instructions:    I discussed the assessment and treatment plan with the patient. The patient was provided an opportunity to ask questions and all were answered. The patient agreed with the plan and demonstrated an understanding of the instructions.   The patient was advised to call back or seek an in-person evaluation if the symptoms worsen or if the condition fails to improve as anticipated.  I provided 10 minutes of non-face-to-face time during this encounter.   Judyann Munson, MD

## 2022-11-09 NOTE — Patient Instructions (Signed)
[facility]  Discharge Instructions: Thank you for choosing [facility] to provide your oncology and hematology care.  If you have a lab appointment with the Cancer Center - please note that after April 8th, 2024, all labs will be drawn in the cancer center.  You do not have to check in or register with the main entrance as you have in the past but will complete your check-in in the cancer center.  Wear comfortable clothing and clothing appropriate for easy access to any Portacath or PICC line.   We strive to give you quality time with your provider. You may need to reschedule your appointment if you arrive late (15 or more minutes).  Arriving late affects you and other patients whose appointments are after yours.  Also, if you miss three or more appointments without notifying the office, you may be dismissed from the clinic at the provider's discretion.      For prescription refill requests, have your pharmacy contact our office and allow 72 hours for refills to be completed.    Today you received Venofer IV iron and B12 injection   To help prevent nausea and vomiting after your treatment, we encourage you to take your nausea medication as directed.  BELOW ARE SYMPTOMS THAT SHOULD BE REPORTED IMMEDIATELY: *FEVER GREATER THAN 100.4 F (38 C) OR HIGHER *CHILLS OR SWEATING *NAUSEA AND VOMITING THAT IS NOT CONTROLLED WITH YOUR NAUSEA MEDICATION *UNUSUAL SHORTNESS OF BREATH *UNUSUAL BRUISING OR BLEEDING *URINARY PROBLEMS (pain or burning when urinating, or frequent urination) *BOWEL PROBLEMS (unusual diarrhea, constipation, pain near the anus) TENDERNESS IN MOUTH AND THROAT WITH OR WITHOUT PRESENCE OF ULCERS (sore throat, sores in mouth, or a toothache) UNUSUAL RASH, SWELLING OR PAIN  UNUSUAL VAGINAL DISCHARGE OR ITCHING   Items with * indicate a potential emergency and should be followed up as soon as possible or go to the Emergency Department if any  problems should occur.  Please show the CHEMOTHERAPY ALERT CARD or IMMUNOTHERAPY ALERT CARD at check-in to the Emergency Department and triage nurse.  Should you have questions after your visit or need to cancel or reschedule your appointment, please contact [facility] [phone]  and follow the prompts.  Office hours are 8:00 a.m. to 4:30 p.m. Monday - Friday. Please note that voicemails left after 4:00 p.m. may not be returned until the following business day.  We are closed weekends and major holidays. You have access to a nurse at all times for urgent questions. Please call the main number to the clinic [phone] and follow the prompts.  For any non-urgent questions, you may also contact your provider using MyChart. We now offer e-Visits for anyone 69 and older to request care online for non-urgent symptoms. For details visit [email or web address].   Also download the MyChart app! Go to the app store, search "MyChart", open the app, select [facility], and log in with your MyChart username and password.

## 2022-11-11 DIAGNOSIS — E119 Type 2 diabetes mellitus without complications: Secondary | ICD-10-CM | POA: Diagnosis not present

## 2022-11-11 DIAGNOSIS — Z6841 Body Mass Index (BMI) 40.0 and over, adult: Secondary | ICD-10-CM | POA: Diagnosis not present

## 2022-11-11 DIAGNOSIS — G894 Chronic pain syndrome: Secondary | ICD-10-CM | POA: Diagnosis not present

## 2022-11-11 DIAGNOSIS — Z79899 Other long term (current) drug therapy: Secondary | ICD-10-CM | POA: Diagnosis not present

## 2022-11-11 DIAGNOSIS — F1120 Opioid dependence, uncomplicated: Secondary | ICD-10-CM | POA: Diagnosis not present

## 2022-11-11 DIAGNOSIS — M5136 Other intervertebral disc degeneration, lumbar region: Secondary | ICD-10-CM | POA: Diagnosis not present

## 2022-11-11 DIAGNOSIS — C9000 Multiple myeloma not having achieved remission: Secondary | ICD-10-CM | POA: Diagnosis not present

## 2022-11-11 DIAGNOSIS — R030 Elevated blood-pressure reading, without diagnosis of hypertension: Secondary | ICD-10-CM | POA: Diagnosis not present

## 2022-11-15 ENCOUNTER — Ambulatory Visit: Payer: Self-pay | Admitting: *Deleted

## 2022-11-15 ENCOUNTER — Other Ambulatory Visit: Payer: Self-pay

## 2022-11-15 NOTE — Patient Outreach (Addendum)
  Care Coordination   Initial Visit Note   11/15/2022 Name: AUDRIENA PIZZIMENTI MRN: [id number] DOB: [birth year]  ZYLPHA SCHNELLE is a 72 y.o. year old female who sees Raliegh Ip, DO for primary care. I spoke with  [name] by phone today.  What matters to the patients health and wellness today?  Sepsis Resolved. Reports she is "fine" and can not think of any medical concerns presently Primarily seen Her oncologist  Continues to have her IV port     Goals Addressed             This Visit's Progress    Care Coordination Services   On track    Care Coordination Goals: Patient will keep all medical appointments Oncology/hematology on 11/16/22 Patient will take medication as prescribed IV antibiotics completed Patient will continue to work with Home Health nurse  Patient will talk with Marval Regal, RN Care Coordinator on 12/16/22 Patient will reach out to this RN Care Coordinator at [phone] with any resource or care coordination needs prior to that appt Interventions Today    Flowsheet Row Most Recent Value  Chronic Disease   Chronic disease during today's visit Other  [sepsis]  General Interventions   General Interventions Discussed/Reviewed General Interventions Discussed, Doctor Visits, Community Resources  [facility] services]  Doctor Visits Discussed/Reviewed Doctor Visits Discussed, PCP, Specialist  PCP/Specialist Visits Compliance with follow-up visit  Exercise Interventions   Exercise Discussed/Reviewed Exercise Discussed, Physical Activity  Physical Activity Discussed/Reviewed Physical Activity Discussed, Home Exercise Program (HEP)  Education Interventions   Education Provided Provided Web-based Education, Provided Education  Provided Verbal Education On [employer], Medication  Mental Health Interventions   Mental Health Discussed/Reviewed Mental Health Discussed, Coping Strategies  Nutrition Interventions   Nutrition Discussed/Reviewed  Nutrition Discussed, Fluid intake  Pharmacy Interventions   Pharmacy Dicussed/Reviewed Pharmacy Topics Discussed, Medications and their functions, Affording Medications  Safety Interventions   Safety Discussed/Reviewed Safety Discussed, Fall Risk           COMPLETED: [facility] RN CM care coordination       Interventions Today    Flowsheet Row Most Recent Value  Chronic Disease   Chronic disease during today's visit Other  [sepsis]      DUPLICATE        SDOH assessments and interventions completed:  Yes  SDOH Interventions Today    Flowsheet Row Most Recent Value  SDOH Interventions   Food Insecurity Interventions Intervention Not Indicated  Housing Interventions Intervention Not Indicated  Transportation Interventions Intervention Not Indicated  Financial Strain Interventions Intervention Not Indicated  Health Literacy Interventions Intervention Not Indicated        Care Coordination Interventions:  Yes, provided   Follow up plan: Follow up call scheduled for 12/16/22 1130    Encounter Outcome:  Pt. Visit Completed    Winton Offord L. Noelle Penner, RN, BSN, CCM [facility] Care Management Community Coordinator Office number [phone]

## 2022-11-15 NOTE — Patient Instructions (Addendum)
Visit Information  Thank you for taking time to visit with me today. Please don't hesitate to contact me if I can be of assistance to you.   Following are the goals we discussed today:   Goals Addressed             This Visit's Progress    Care Coordination Services   On track    Care Coordination Goals: Patient will keep all medical appointments Oncology/hematology on 11/16/22 Patient will take medication as prescribed IV antibiotics completed Patient will continue to work with Home Health nurse  Patient will talk with Marval Regal, RN Care Coordinator on 12/16/22 Patient will reach out to this RN Care Coordinator at [phone] with any resource or care coordination needs prior to that appt Interventions Today    Flowsheet Row Most Recent Value  Chronic Disease   Chronic disease during today's visit Other  [sepsis]  General Interventions   General Interventions Discussed/Reviewed General Interventions Discussed, Doctor Visits, Community Resources  [facility] services]  Doctor Visits Discussed/Reviewed Doctor Visits Discussed, PCP, Specialist  PCP/Specialist Visits Compliance with follow-up visit  Exercise Interventions   Exercise Discussed/Reviewed Exercise Discussed, Physical Activity  Physical Activity Discussed/Reviewed Physical Activity Discussed, Home Exercise Program (HEP)  Education Interventions   Education Provided Provided Web-based Education, Provided Education  Provided Verbal Education On [employer], Medication  Mental Health Interventions   Mental Health Discussed/Reviewed Mental Health Discussed, Coping Strategies  Nutrition Interventions   Nutrition Discussed/Reviewed Nutrition Discussed, Fluid intake  Pharmacy Interventions   Pharmacy Dicussed/Reviewed Pharmacy Topics Discussed, Medications and their functions, Affording Medications  Safety Interventions   Safety Discussed/Reviewed Safety Discussed, Fall Risk           COMPLETED: [facility] RN CM care  coordination       Interventions Today    Flowsheet Row Most Recent Value  Chronic Disease   Chronic disease during today's visit Other  [sepsis]      DUPLICATE        Our next appointment is by telephone on 12/16/22 at 1130  Please call the care guide team at [phone] if you need to cancel or reschedule your appointment.   If you are experiencing a Mental Health or Behavioral Health Crisis or need someone to talk to, please call the Suicide and Crisis Lifeline: 988 call the [place] National Suicide Prevention Lifeline: 214-173-3860 or TTY: (902) 839-7964 TTY 317 272 4642) to talk to a trained counselor call 1-800-273-TALK (toll free, 24 hour hotline) call the [facility]: ([phone] call 911   Patient verbalizes understanding of instructions and care plan provided today and agrees to view in MyChart. Active MyChart status and patient understanding of how to access instructions and care plan via MyChart confirmed with patient.     The patient has been provided with contact information for the care management team and has been advised to call with any health related questions or concerns.   Emberlie Gotcher L. Noelle Penner, RN, BSN, CCM [facility] Care Management Community Coordinator Office number [phone]

## 2022-11-16 ENCOUNTER — Other Ambulatory Visit: Payer: Self-pay | Admitting: Hematology

## 2022-11-16 ENCOUNTER — Inpatient Hospital Stay: Payer: Medicare PPO

## 2022-11-16 VITALS — BP 142/56 | HR 73 | Temp 98.9°F | Resp 18

## 2022-11-16 DIAGNOSIS — D509 Iron deficiency anemia, unspecified: Secondary | ICD-10-CM | POA: Diagnosis not present

## 2022-11-16 DIAGNOSIS — C9000 Multiple myeloma not having achieved remission: Secondary | ICD-10-CM

## 2022-11-16 DIAGNOSIS — Z79899 Other long term (current) drug therapy: Secondary | ICD-10-CM | POA: Diagnosis not present

## 2022-11-16 DIAGNOSIS — Z5112 Encounter for antineoplastic immunotherapy: Secondary | ICD-10-CM | POA: Diagnosis not present

## 2022-11-16 DIAGNOSIS — Z95828 Presence of other vascular implants and grafts: Secondary | ICD-10-CM

## 2022-11-16 MED ORDER — HEPARIN SOD (PORK) LOCK FLUSH 100 UNIT/ML IV SOLN
500.0000 [IU] | Freq: Once | INTRAVENOUS | Status: AC
  Administered 2022-11-16: 500 [IU] via INTRAVENOUS

## 2022-11-16 MED ORDER — SODIUM CHLORIDE 0.9% FLUSH
10.0000 mL | INTRAVENOUS | Status: DC | PRN
  Administered 2022-11-16: 10 mL via INTRAVENOUS

## 2022-11-16 NOTE — Progress Notes (Signed)
Patient presents for PICC line dressing change per MD orders.  PICC line flushed without incidence.  Patient remained stable during this time and tolerated well.  She was discharged stable via wheelchair with her granddaughter.  She is aware of her next upcoming appointments.

## 2022-11-16 NOTE — Patient Instructions (Signed)
[facility]  Discharge Instructions  Your PICC line dressing changed today.  PICC flushed without incidence.  Educated you on keeping the PICC dressing dry and intact.  Do not add cap to end of PICC line.  It is a closed system.  We will see you next week.     Thank you for choosing [facility] to provide your oncology and hematology care.   To afford each patient quality time with our provider, please arrive at least 15 minutes before your scheduled appointment time. You may need to reschedule your appointment if you arrive late (10 or more minutes). Arriving late affects you and other patients whose appointments are after yours.  Also, if you miss three or more appointments without notifying the office, you may be dismissed from the clinic at the provider's discretion.    Again, thank you for choosing [facility].  Our hope is that these requests will decrease the amount of time that you wait before being seen by our physicians.   If you have a lab appointment with the Cancer Center - please note that after April 8th, all labs will be drawn in the cancer center.  You do not have to check in or register with the main entrance as you have in the past but will complete your check-in at the cancer center.            _____________________________________________________________  Should you have questions after your visit to [facility], please contact our office at [phone] and follow the prompts.  Our office hours are 8:00 a.m. to 4:30 p.m. Monday - Thursday and 8:00 a.m. to 2:30 p.m. Friday.  Please note that voicemails left after 4:00 p.m. may not be returned until the following business day.  We are closed weekends and all major holidays.  You do have access to a nurse 24-7, just call the main number to the clinic [phone] and do not press any options, hold on the line and a nurse will answer the phone.     For prescription refill requests, have your pharmacy contact our office and allow 72 hours.    Masks are no longer required in the cancer centers. If you would like for your care team to wear a mask while they are taking care of you, please let them know. You may have one support person who is at least 73 years old accompany you for your appointments.

## 2022-11-18 ENCOUNTER — Other Ambulatory Visit: Payer: Self-pay

## 2022-11-18 DIAGNOSIS — C9000 Multiple myeloma not having achieved remission: Secondary | ICD-10-CM

## 2022-11-18 MED ORDER — LENALIDOMIDE 15 MG PO CAPS
ORAL_CAPSULE | ORAL | 0 refills | Status: DC
Start: 2022-11-18 — End: 2022-12-15

## 2022-11-18 NOTE — Telephone Encounter (Signed)
Chart reviewed. Revlimid refilled per last office note with Dr. Katragadda.  

## 2022-11-23 ENCOUNTER — Inpatient Hospital Stay: Payer: Medicare PPO

## 2022-11-23 ENCOUNTER — Inpatient Hospital Stay: Payer: Medicare PPO | Admitting: Hematology

## 2022-11-23 VITALS — BP 107/89 | HR 90 | Temp 98.5°F | Resp 18

## 2022-11-23 DIAGNOSIS — Z5112 Encounter for antineoplastic immunotherapy: Secondary | ICD-10-CM | POA: Diagnosis not present

## 2022-11-23 DIAGNOSIS — D500 Iron deficiency anemia secondary to blood loss (chronic): Secondary | ICD-10-CM

## 2022-11-23 DIAGNOSIS — C9000 Multiple myeloma not having achieved remission: Secondary | ICD-10-CM | POA: Diagnosis not present

## 2022-11-23 DIAGNOSIS — Z79899 Other long term (current) drug therapy: Secondary | ICD-10-CM | POA: Diagnosis not present

## 2022-11-23 DIAGNOSIS — D509 Iron deficiency anemia, unspecified: Secondary | ICD-10-CM | POA: Diagnosis not present

## 2022-11-23 MED ORDER — SODIUM CHLORIDE 0.9% FLUSH
10.0000 mL | Freq: Once | INTRAVENOUS | Status: AC | PRN
  Administered 2022-11-23: 10 mL

## 2022-11-23 MED ORDER — HEPARIN SOD (PORK) LOCK FLUSH 100 UNIT/ML IV SOLN
500.0000 [IU] | Freq: Once | INTRAVENOUS | Status: AC | PRN
  Administered 2022-11-23: 500 [IU]

## 2022-11-23 MED ORDER — CETIRIZINE HCL 10 MG PO TABS
10.0000 mg | ORAL_TABLET | Freq: Once | ORAL | Status: DC
  Filled 2022-11-23: qty 1

## 2022-11-23 MED ORDER — LORATADINE 10 MG PO TABS: 10.0000 mg | ORAL_TABLET | Freq: Once | ORAL | Status: DC

## 2022-11-23 MED ORDER — SODIUM CHLORIDE 0.9 % IV SOLN
500.0000 mg | Freq: Once | INTRAVENOUS | Status: AC
  Administered 2022-11-23: 500 mg via INTRAVENOUS
  Filled 2022-11-23: qty 25

## 2022-11-23 MED ORDER — SODIUM CHLORIDE 0.9 % IV SOLN: Freq: Once | INTRAVENOUS | Status: AC

## 2022-11-23 NOTE — Patient Instructions (Signed)
[facility]  Discharge Instructions: Thank you for choosing [facility] to provide your oncology and hematology care.  If you have a lab appointment with the Cancer Center - please note that after April 8th, 2024, all labs will be drawn in the cancer center.  You do not have to check in or register with the main entrance as you have in the past but will complete your check-in in the cancer center.  Wear comfortable clothing and clothing appropriate for easy access to any Portacath or PICC line.   We strive to give you quality time with your provider. You may need to reschedule your appointment if you arrive late (15 or more minutes).  Arriving late affects you and other patients whose appointments are after yours.  Also, if you miss three or more appointments without notifying the office, you may be dismissed from the clinic at the provider's discretion.      For prescription refill requests, have your pharmacy contact our office and allow 72 hours for refills to be completed.    Today you received the following chemotherapy and/or immunotherapy agents Venofer 500 mg. Iron Sucrose Injection What is this medication? IRON SUCROSE (EYE ern SOO krose) treats low levels of iron (iron deficiency anemia) in people with kidney disease. Iron is a mineral that plays an important role in making red blood cells, which carry oxygen from your lungs to the rest of your body. This medicine may be used for other purposes; ask your health care provider or pharmacist if you have questions. COMMON BRAND NAME(S): Venofer What should I tell my care team before I take this medication? They need to know if you have any of these conditions: Anemia not caused by low iron levels Heart disease High levels of iron in the blood Kidney disease Liver disease An unusual or allergic reaction to iron, other medications, foods, dyes, or preservatives Pregnant or trying to get  pregnant Breastfeeding How should I use this medication? This medication is for infusion into a vein. It is given in a hospital or clinic setting. Talk to your care team about the use of this medication in children. While this medication may be prescribed for children as young as 2 years for selected conditions, precautions do apply. Overdosage: If you think you have taken too much of this medicine contact a poison control center or emergency room at once. NOTE: This medicine is only for you. Do not share this medicine with others. What if I miss a dose? Keep appointments for follow-up doses. It is important not to miss your dose. Call your care team if you are unable to keep an appointment. What may interact with this medication? Do not take this medication with any of the following: Deferoxamine Dimercaprol Other iron products This medication may also interact with the following: Chloramphenicol Deferasirox This list may not describe all possible interactions. Give your health care provider a list of all the medicines, herbs, non-prescription drugs, or dietary supplements you use. Also tell them if you smoke, drink alcohol, or use illegal drugs. Some items may interact with your medicine. What should I watch for while using this medication? Visit your care team regularly. Tell your care team if your symptoms do not start to get better or if they get worse. You may need blood work done while you are taking this medication. You may need to follow a special diet. Talk to your care team. Foods that contain iron include: whole  grains/cereals, dried fruits, beans, or peas, leafy green vegetables, and organ meats (liver, kidney). What side effects may I notice from receiving this medication? Side effects that you should report to your care team as soon as possible: Allergic reactions--skin rash, itching, hives, swelling of the face, lips, tongue, or throat Low blood pressure--dizziness, feeling  faint or lightheaded, blurry vision Shortness of breath Side effects that usually do not require medical attention (report to your care team if they continue or are bothersome): Flushing Headache Joint pain Muscle pain Nausea Pain, redness, or irritation at injection site This list may not describe all possible side effects. Call your doctor for medical advice about side effects. You may report side effects to FDA at 1-800-FDA-1088. Where should I keep my medication? This medication is given in a hospital or clinic. It will not be stored at home. NOTE: This sheet is a summary. It may not cover all possible information. If you have questions about this medicine, talk to your doctor, pharmacist, or health care provider.  2024 Elsevier/Gold Standard (2022-08-19 00:00:00)       To help prevent nausea and vomiting after your treatment, we encourage you to take your nausea medication as directed.  BELOW ARE SYMPTOMS THAT SHOULD BE REPORTED IMMEDIATELY: *FEVER GREATER THAN 100.4 F (38 C) OR HIGHER *CHILLS OR SWEATING *NAUSEA AND VOMITING THAT IS NOT CONTROLLED WITH YOUR NAUSEA MEDICATION *UNUSUAL SHORTNESS OF BREATH *UNUSUAL BRUISING OR BLEEDING *URINARY PROBLEMS (pain or burning when urinating, or frequent urination) *BOWEL PROBLEMS (unusual diarrhea, constipation, pain near the anus) TENDERNESS IN MOUTH AND THROAT WITH OR WITHOUT PRESENCE OF ULCERS (sore throat, sores in mouth, or a toothache) UNUSUAL RASH, SWELLING OR PAIN  UNUSUAL VAGINAL DISCHARGE OR ITCHING   Items with * indicate a potential emergency and should be followed up as soon as possible or go to the Emergency Department if any problems should occur.  Please show the CHEMOTHERAPY ALERT CARD or IMMUNOTHERAPY ALERT CARD at check-in to the Emergency Department and triage nurse.  Should you have questions after your visit or need to cancel or reschedule your appointment, please contact [facility]  [phone]  and follow the prompts.  Office hours are 8:00 a.m. to 4:30 p.m. Monday - Friday. Please note that voicemails left after 4:00 p.m. may not be returned until the following business day.  We are closed weekends and major holidays. You have access to a nurse at all times for urgent questions. Please call the main number to the clinic [phone] and follow the prompts.  For any non-urgent questions, you may also contact your provider using MyChart. We now offer e-Visits for anyone 98 and older to request care online for non-urgent symptoms. For details visit [email or web address].   Also download the MyChart app! Go to the app store, search "MyChart", open the app, select [facility], and log in with your MyChart username and password.

## 2022-11-23 NOTE — Progress Notes (Signed)
Patient presents today for Venofer 500 mg iron infusion. Patient took premedications prior to arrival. Tylenol 650 mg and Claritin 10 mg PO.   Venofer given today per MD orders. Tolerated infusion without adverse affects. Vital signs stable. No complaints at this time. Discharged from clinic by wheel chair in stable condition. Alert and oriented x 3. F/U with [facility] as scheduled.

## 2022-11-24 DIAGNOSIS — J449 Chronic obstructive pulmonary disease, unspecified: Secondary | ICD-10-CM | POA: Diagnosis not present

## 2022-11-26 ENCOUNTER — Other Ambulatory Visit: Payer: Self-pay

## 2022-11-29 NOTE — Progress Notes (Signed)
[facility] [address] [place] [address]    Clinic Day:  11/30/2022  Referring physician: Raliegh Ip, DO  Patient Care Team: Raliegh Ip, DO as PCP - General (Family Medicine) Wyline Mood Dorothe Pea, MD as PCP - Cardiology (Cardiology) Letta Kocher, MD (Rehabilitation) Coralyn Helling, MD as Consulting Physician (Pulmonary Disease) Lanelle Bal, DO as Consulting Physician (Internal Medicine) Merryl Hacker, NP as Nurse Practitioner (Nurse Practitioner) Bonna Gains, MD as Referring Physician (Gastroenterology) Doreatha Massed, MD as Medical Oncologist (Medical Oncology) Therese Sarah, RN as Oncology Nurse Navigator (Medical Oncology) Clinton Gallant, RN as [facility] Care Management   ASSESSMENT & PLAN:   Assessment: 1. 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  Orderly maturation of erythroid and myeloid series. - Myeloma FISH panel: t(11;14) - Cytogenetics: Failed to grow metaphases - PET scan (11/18/2021): No evidence of active myeloma or plasmacytoma.  Right upper lobe lung infection. - Skull x-rays (10/29/2021) multiple discrete round lucent/lytic lesions in the skull - Worsening M spike and free light chain ratio.  Normal creatinine and calcium.  Multifactorial anemia including blood loss and bone marrow infiltration. - Cycle 1 of DRd started on 12/14/2021 with lenalidomide 15 mg 3 weeks on/1 week off and dexamethasone 20 mg weekly - Hospitalized from 09/26/2022 through 10/03/2022 with MSSA bacteremia, port removed, cefepime started, to continue until 10/26/2022   2.  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  Quit smoking 15 years ago.  No exposure to chemicals or pesticides. - Paternal grandmother had ovarian cancer.  Sister had ovarian cancer.   3.  Cirrhosis: - Likely  secondary to NAFLD with splenomegaly resulting in mild thrombocytopenia.  Seen by transplant hepatology service at [facility].    Plan: 1.  Stage II IgG kappa myeloma, standard risk: - She was started back on daratumumab on 11/02/2022.  No infections reported since then. - Labs from 11/30/2022: Normal creatinine and LFTs.  CBC with mild leukopenia and thrombocytopenia stable. - Myeloma labs from 10/26/2022: M spike 0.3 g and stable.  Free light chain ratio is stable at 2.6. - Continue daratumumab monthly.  Continue Revlimid 15 mg 21 days on/7 days off.  Reports soreness of the tongue.  Use Biotene as needed. - RTC 2 months for follow-up with multiple myeloma labs 2 weeks prior.   2.  Iron deficiency anemia from chronic GI bleed: -Continue Venofer 500 mg every 2 weeks.  Hemoglobin today improved to 12.5.  Last ferritin was 156.   3.  Infection prophylaxis: - Continue acyclovir 400 mg twice daily.  Aspirin on hold for bleeding.   4.  Cirrhosis: - Continue Lasix 20 mg daily, carvedilol 6.5 mg daily and lactulose daily.   5.  Peripheral neuropathy: - She feels like walking on tiny pillows.  This is stable.    Orders Placed This Encounter  Procedures   CBC with Differential    Standing Status:   Future    Standing Expiration Date:   02/22/2024   CBC with Differential    Standing Status:   Future    Standing Expiration Date:   03/21/2024   CBC with Differential    Standing Status:   Future    Standing Expiration Date:   04/18/2024      I,Katie Daubenspeck,acting as a scribe for Doreatha Massed, MD.,have documented all relevant  documentation on the behalf of Doreatha Massed, MD,as directed by  Doreatha Massed, MD while in the presence of Doreatha Massed, MD.   I, Doreatha Massed MD, have reviewed the above documentation for accuracy and completeness, and I agree with the above.   Doreatha Massed, MD   9/4/20244:53 PM  CHIEF COMPLAINT:   Diagnosis: stage II  standard risk IgG kappa plasma cell myeloma    Cancer Staging  Multiple myeloma (HCC) Staging form: Plasma Cell Myeloma and Plasma Cell Disorders, AJCC 8th Edition - Clinical stage from 11/30/2021: RISS Stage II (Beta-2-microglobulin (mg/L): 2.8, Albumin (g/dL): 3, ISS: Stage II, High-risk cytogenetics: Absent, LDH: Normal) - Signed by Doreatha Massed, MD on 11/30/2021    Prior Therapy: none  Current Therapy:  Daratumumab, lenalidomide and dexamethasone    HISTORY OF PRESENT ILLNESS:   Oncology History  Multiple myeloma (HCC)  11/30/2021 Initial Diagnosis   Multiple myeloma (HCC)   11/30/2021 Cancer Staging   Staging form: Plasma Cell Myeloma and Plasma Cell Disorders, AJCC 8th Edition - Clinical stage from 11/30/2021: RISS Stage II (Beta-2-microglobulin (mg/L): 2.8, Albumin (g/dL): 3, ISS: Stage II, High-risk cytogenetics: Absent, LDH: Normal) - Signed by Doreatha Massed, MD on 11/30/2021 Histopathologic type: Multiple myeloma Stage prefix: Initial diagnosis Beta 2 microglobulin range (mg/L): Less than 3.5 Albumin range (g/dL): Less than 3.5 Cytogenetics: t(11;14) translocation Serum calcium level: Normal   12/14/2021 -  Chemotherapy   Patient is on Treatment Plan : MYELOMA  Daratumumab SQ + Lenalidomide + Dexamethasone (DaraRd) q28d        INTERVAL HISTORY:   Savannah Burns is a 72 y.o. female presenting to clinic today for follow up of stage II standard risk IgG kappa plasma cell myeloma. She was last seen by me on 10/26/22.  Today, she states that she is doing well overall. Her appetite level is at 100%. Her energy level is at 25%.  PAST MEDICAL HISTORY:   Past Medical History: Past Medical History:  Diagnosis Date   Adrenal adenoma, left    Stable   Anxiety    Arthritis    bilateral hands   Depression    Diabetes mellitus, type 2 (HCC) 08/12/2008   Qualifier: Diagnosis of  By: Dayton Martes MD, Talia     Dyspnea    Esophageal varices (HCC)    Grade II diastolic dysfunction     History of kidney stones    Hyperlipidemia    Hypertension    Lower back pain    Lower GI bleed 03/19/2020   Panic attacks    Pneumonia    currently taking antibiotic and prednisone for early stages of pneumonia   Pulmonary nodules    bilateral   Skin cancer    face    Surgical History: Past Surgical History:  Procedure Laterality Date   BIOPSY  04/07/2020   Procedure: BIOPSY;  Surgeon: Lanelle Bal, DO;  [facility];  Service: Endoscopy;;   Breast Cystectomy  Right    CESAREAN SECTION     COLONOSCOPY WITH PROPOFOL N/A 01/25/2020   Dr. Marletta Lor: Nonbleeding internal hemorrhoids, diverticulosis, 5 mm polyp removed from the ascending colon, 10 mm polyp removed from the sigmoid colon, 30 mm polyp (tubulovillous adenoma with no high-grade dysplasia) removed from the transverse colon via piecemeal status post tattoo.  Other polyps were tubular adenomas.  3 month surveillance colonoscopy recommended.   COLONOSCOPY WITH PROPOFOL N/A 04/07/2020   Procedure: COLONOSCOPY WITH PROPOFOL;  Surgeon: Lanelle Bal, DO;  [facility]: [facility];  Service: Endoscopy;  Laterality: N/A;  3:00pm, pt knows new time per office   CYSTOSCOPY/URETEROSCOPY/HOLMIUM LASER/STENT PLACEMENT Bilateral 03/01/2019   Procedure: CYSTOSCOPY/RETROGRADEURETEROSCOPY/HOLMIUM LASER/STENT PLACEMENT;  Surgeon: Rene Paci, MD;  Location: [facility];  Service: Urology;  Laterality: Bilateral;  ONLY NEEDS 60 MIN   ESOPHAGOGASTRODUODENOSCOPY (EGD) WITH PROPOFOL N/A 01/25/2020   Dr. Marletta Lor: 4 columns grade 1 esophageal varices   ESOPHAGOGASTRODUODENOSCOPY (EGD) WITH PROPOFOL N/A 05/18/2020   Procedure: ESOPHAGOGASTRODUODENOSCOPY (EGD) WITH PROPOFOL;  Surgeon: Lanelle Bal, DO;  [facility];  Service: Endoscopy;  Laterality: N/A;   ESOPHAGOGASTRODUODENOSCOPY (EGD) WITH PROPOFOL N/A 07/28/2020   Procedure: ESOPHAGOGASTRODUODENOSCOPY (EGD) WITH PROPOFOL;  Surgeon: Lanelle Bal, DO;   [facility];  Service: Endoscopy;  Laterality: N/A;  am or early PM due to givens capsule placement   GIVENS CAPSULE STUDY N/A 05/18/2020   Procedure: GIVENS CAPSULE STUDY;  Surgeon: Dolores Frame, MD;  [facility];  Service: Gastroenterology;  Laterality: N/A;   GIVENS CAPSULE STUDY N/A 07/28/2020   Procedure: GIVENS CAPSULE STUDY;  Surgeon: Lanelle Bal, DO;  [facility];  Service: Endoscopy;  Laterality: N/A;   IR IMAGING GUIDED PORT INSERTION  12/02/2021   IRRIGATION AND DEBRIDEMENT ABSCESS Right 09/28/2022   Procedure: IRRIGATION AND DEBRIDEMENT ABSCESS;  Surgeon: Lewie Chamber, DO;  Location: [facility];  Service: General;  Laterality: Right;   POLYPECTOMY  01/25/2020   Procedure: POLYPECTOMY;  Surgeon: Lanelle Bal, DO;  [facility];  Service: Endoscopy;;   POLYPECTOMY  04/07/2020   Procedure: POLYPECTOMY INTESTINAL;  Surgeon: Lanelle Bal, DO;  [facility];  Service: Endoscopy;;   PORT-A-CATH REMOVAL Right 09/28/2022   Procedure: MINOR REMOVAL PORT-A-CATH;  Surgeon: Lewie Chamber, DO;  Location: [facility];  Service: General;  Laterality: Right;   SKIN CANCER EXCISION     Face   SPINE SURGERY     SUBMUCOSAL TATTOO INJECTION  01/25/2020   Procedure: SUBMUCOSAL TATTOO INJECTION;  Surgeon: Lanelle Bal, DO;  [facility];  Service: Endoscopy;;    Social History: Social History   Socioeconomic History   Marital status: Widowed    Spouse name: Not on file   Number of children: 2   Years of education: 14   Highest education level: Not on file  Occupational History   Occupation: Retired   Tobacco Use   Smoking status: Former    Current packs/day: 0.00    Average packs/day: 1.5 packs/day for 40.0 years (60.0 ttl pk-yrs)    Types: Cigarettes    Start date: 04/29/1975    Quit date: 04/29/2015    Years since quitting: 7.5   Smokeless tobacco: Never   Tobacco comments:    Quit  smoking 04/2015- Previous 1.5 ppd smoker  Vaping Use   Vaping status: Never Used  Substance and Sexual Activity   Alcohol use: No    Alcohol/week: 0.0 standard drinks of alcohol   Drug use: No   Sexual activity: Not Currently    Birth control/protection: Post-menopausal  Other Topics Concern   Not on file  Social History Narrative   Her 57 year old granddaughter lives with her - one daughter lives nearby, but she doesn't have a good relationship with her. Has a great relationship with other daughter who lives 1.5 hrs away - talks to her daily on the phone.   Social Determinants of Health   Financial Resource Strain: High Risk (11/15/2022)   Overall  Financial Resource Strain (CARDIA)    Difficulty of Paying Living Expenses: Very hard  Food Insecurity: Food Insecurity Present (11/15/2022)   Hunger Vital Sign    Worried About Running Out of Food in the Last Year: Sometimes true    Ran Out of Food in the Last Year: Never true  Transportation Needs: No Transportation Needs (11/15/2022)   PRAPARE - Administrator, Civil Service (Medical): No    Lack of Transportation (Non-Medical): No  Physical Activity: Inactive (07/27/2021)   Exercise Vital Sign    Days of Exercise per Week: 0 days    Minutes of Exercise per Session: 0 min  Stress: Stress Concern Present (07/27/2021)   [employer] of Occupational Health - Occupational Stress Questionnaire    Feeling of Stress : To some extent  Social Connections: Socially Isolated (07/27/2021)   Social Connection and Isolation Panel [NHANES]    Frequency of Communication with Friends and Family: More than three times a week    Frequency of Social Gatherings with Friends and Family: Once a week    Attends Religious Services: Never    Database administrator or Organizations: No    Attends Banker Meetings: Never    Marital Status: Widowed  Intimate Partner Violence: Not At Risk (09/26/2022)   Humiliation, Afraid, Rape, and Kick  questionnaire    Fear of Current or Ex-Partner: No    Emotionally Abused: No    Physically Abused: No    Sexually Abused: No    Family History: Family History  Problem Relation Age of Onset   Diabetes Father    Heart disease Father 36       MI   Hypertension Father    Anemia Mother        Transfusion dependent   COPD Sister    Cancer Paternal Grandmother 28       Pancreatic    Current Medications:  Current Outpatient Medications:    acyclovir (ZOVIRAX) 400 MG tablet, Take 1 tablet (400 mg total) by mouth 2 (two) times daily., Disp: 60 tablet, Rfl: 6   baclofen (LIORESAL) 10 MG tablet, Take 10 mg by mouth 2 (two) times daily., Disp: , Rfl:    Blood Glucose Calibration (TRUE METRIX LEVEL 1) Low SOLN, Use w/ glucose monitor Dx E11.9, Disp: 3 each, Rfl: 0   Blood Glucose Monitoring Suppl (TRUE METRIX AIR GLUCOSE METER) w/Device KIT, Check BS daily Dx E11.9, Disp: 1 kit, Rfl: 0   Cholecalciferol (VITAMIN D) 50 MCG (2000 UT) tablet, Take 2,000 Units by mouth daily., Disp: , Rfl:    clobetasol (TEMOVATE) 0.05 % external solution, Apply 1 Application topically 2 (two) times daily., Disp: , Rfl:    Cyanocobalamin (B-12 COMPLIANCE INJECTION) 1000 MCG/ML KIT, Inject 1,000 mcg as directed every 30 (thirty) days., Disp: , Rfl:    dexamethasone (DECADRON) 4 MG tablet, Take 5 tablets (20 mg total) by mouth once a week., Disp: 20 tablet, Rfl: 3   esomeprazole (NEXIUM) 40 MG capsule, Take 1 capsule (40 mg total) by mouth daily., Disp: 30 capsule, Rfl: 2   glucose blood (TRUE METRIX BLOOD GLUCOSE TEST) test strip, Check BS daily Dx E11.9, Disp: 100 each, Rfl: 3   lenalidomide (REVLIMID) 15 MG capsule, TAKE 1 CAPSULE BY MOUTH EVERY DAY FOR 21 DAYS ON THEN 7 DAYS OFF, Disp: 21 capsule, Rfl: 0   lidocaine-prilocaine (EMLA) cream, Apply 1 Application topically as needed. Apply a small amount to port a cath site and  cover with plastic wrap 1 hour prior to infusion appointments, Disp: 30 g, Rfl: 3    lovastatin (MEVACOR) 20 MG tablet, Take 1 tablet (20 mg total) by mouth at bedtime., Disp: 90 tablet, Rfl: 3   mupirocin ointment (BACTROBAN) 2 %, Apply 1 Application topically 2 (two) times daily., Disp: 22 g, Rfl: 1   nystatin (MYCOSTATIN/NYSTOP) powder, Apply topically 2 (two) times daily., Disp: 15 g, Rfl: 0   oxyCODONE-acetaminophen (PERCOCET) 10-325 MG tablet, Take 1 tablet by mouth every 6 (six) hours as needed for pain., Disp: , Rfl:    OXYGEN, Inhale 3 L into the lungs continuous., Disp: , Rfl:    OZEMPIC, 0.25 OR 0.5 MG/DOSE, 2 MG/3ML SOPN, Inject 2 mg into the skin once a week., Disp: , Rfl:    potassium chloride SA (KLOR-CON) 20 MEQ tablet, Take 2 tablets (40 mEq total) by mouth 2 (two) times daily. (Patient taking differently: Take 40 mEq by mouth daily.), Disp: 360 tablet, Rfl: 3   prochlorperazine (COMPAZINE) 10 MG tablet, Take 1 tablet (10 mg total) by mouth every 6 (six) hours as needed for nausea or vomiting., Disp: 30 tablet, Rfl: 6   torsemide (DEMADEX) 20 MG tablet, Take 2.5 tablets (50 mg total) by mouth 2 (two) times daily., Disp: , Rfl:    TRUEplus Lancets 33G MISC, Check BS daily Dx E11.9, Disp: 100 each, Rfl: 3   venlafaxine XR (EFFEXOR-XR) 150 MG 24 hr capsule, Take 1 capsule (150 mg total) by mouth daily with breakfast., Disp: 30 capsule, Rfl: 2   ferrous sulfate 325 (65 FE) MG tablet, Take 1 tablet (325 mg total) by mouth daily with breakfast., Disp: 30 tablet, Rfl: 3 No current facility-administered medications for this visit.  Facility-Administered Medications Ordered in Other Visits:    sodium chloride flush (NS) 0.9 % injection 10 mL, 10 mL, Intravenous, PRN, Pennington, Rebekah M, PA-C, 10 mL at 03/08/22 1344   Allergies: Allergies  Allergen Reactions   Keflex [Cephalexin] Nausea And Vomiting    REVIEW OF SYSTEMS:   Review of Systems  Constitutional:  Negative for chills, fatigue and fever.  HENT:   Negative for lump/mass, mouth sores, nosebleeds, sore  throat and trouble swallowing.   Eyes:  Negative for eye problems.  Respiratory:  Positive for shortness of breath. Negative for cough.   Cardiovascular:  Negative for chest pain, leg swelling and palpitations.  Gastrointestinal:  Negative for abdominal pain, constipation, diarrhea, nausea and vomiting.  Genitourinary:  Negative for bladder incontinence, difficulty urinating, dysuria, frequency, hematuria and nocturia.   Musculoskeletal:  Negative for arthralgias, back pain, flank pain, myalgias and neck pain.  Skin:  Negative for itching and rash.  Neurological:  Positive for numbness. Negative for dizziness and headaches.  Hematological:  Does not bruise/bleed easily.  Psychiatric/Behavioral:  Negative for depression, sleep disturbance and suicidal ideas. The patient is not nervous/anxious.   All other systems reviewed and are negative.    VITALS:   There were no vitals taken for this visit.  Wt Readings from Last 3 Encounters:  11/30/22 280 lb 13.9 oz (127.4 kg)  11/02/22 271 lb 2.7 oz (123 kg)  10/10/22 277 lb 6.4 oz (125.8 kg)    There is no height or weight on file to calculate BMI.  Performance status (ECOG): 1 - Symptomatic but completely ambulatory  PHYSICAL EXAM:   Physical Exam Vitals and nursing note reviewed. Exam conducted with a chaperone present.  Constitutional:      Appearance: Normal appearance.  Cardiovascular:     Rate and Rhythm: Normal rate and regular rhythm.     Pulses: Normal pulses.     Heart sounds: Normal heart sounds.  Pulmonary:     Effort: Pulmonary effort is normal.     Breath sounds: Normal breath sounds.  Abdominal:     Palpations: Abdomen is soft. There is no hepatomegaly, splenomegaly or mass.     Tenderness: There is no abdominal tenderness.  Musculoskeletal:     Right lower leg: No edema.     Left lower leg: No edema.  Lymphadenopathy:     Cervical: No cervical adenopathy.     Right cervical: No superficial, deep or posterior  cervical adenopathy.    Left cervical: No superficial, deep or posterior cervical adenopathy.     Upper Body:     Right upper body: No supraclavicular or axillary adenopathy.     Left upper body: No supraclavicular or axillary adenopathy.  Neurological:     General: No focal deficit present.     Mental Status: She is alert and oriented to person, place, and time.  Psychiatric:        Mood and Affect: Mood normal.        Behavior: Behavior normal.     LABS:      Latest Ref Rng & Units 11/30/2022    8:28 AM 11/02/2022    8:47 AM 10/26/2022    8:09 AM  CBC  WBC 4.0 - 10.5 K/uL 3.3  4.5  4.4   Hemoglobin 12.0 - 15.0 g/dL 40.9  81.1  91.4   Hematocrit 36.0 - 46.0 % 39.3  37.7  35.4   Platelets 150 - 400 K/uL 104  94  95       Latest Ref Rng & Units 11/30/2022    8:28 AM 11/02/2022    8:47 AM 10/10/2022    8:19 AM  CMP  Glucose 70 - 99 mg/dL 782  956  213   BUN 8 - 23 mg/dL 11  12  14    Creatinine 0.44 - 1.00 mg/dL 0.86  5.78  4.69   Sodium 135 - 145 mmol/L 137  139  139   Potassium 3.5 - 5.1 mmol/L 3.8  3.7  3.6   Chloride 98 - 111 mmol/L 102  101  104   CO2 22 - 32 mmol/L 26  30  27    Calcium 8.9 - 10.3 mg/dL 8.3  8.6  8.3   Total Protein 6.5 - 8.1 g/dL 5.7  6.2  5.8   Total Bilirubin 0.3 - 1.2 mg/dL 0.4  0.5  0.3   Alkaline Phos 38 - 126 U/L 121  116  98   AST 15 - 41 U/L 21  15  23    ALT 0 - 44 U/L 23  12  12       No results found for: "CEA1", "CEA" / No results found for: "CEA1", "CEA" No results found for: "PSA1" No results found for: "CAN199" No results found for: "CAN125"  Lab Results  Component Value Date   TOTALPROTELP 5.3 (L) 10/26/2022   ALBUMINELP 3.3 10/26/2022   A1GS 0.2 10/26/2022   A2GS 0.6 10/26/2022   BETS 0.7 10/26/2022   GAMS 0.5 10/26/2022   MSPIKE 0.3 (H) 10/26/2022   SPEI Comment 10/26/2022   Lab Results  Component Value Date   TIBC 331 10/26/2022   TIBC 338 08/02/2022   TIBC 375 06/07/2022   FERRITIN 156 10/26/2022   FERRITIN 81  08/02/2022  FERRITIN 51 06/07/2022   IRONPCTSAT 20 10/26/2022   IRONPCTSAT 13 08/02/2022   IRONPCTSAT 27 06/07/2022   Lab Results  Component Value Date   LDH 92 (L) 10/22/2021   LDH 101 06/25/2021   LDH 103 03/24/2021     STUDIES:   No results found.

## 2022-11-30 ENCOUNTER — Inpatient Hospital Stay: Payer: Medicare PPO | Admitting: Hematology

## 2022-11-30 ENCOUNTER — Inpatient Hospital Stay: Payer: Medicare PPO | Attending: Hematology

## 2022-11-30 ENCOUNTER — Inpatient Hospital Stay: Payer: Medicare PPO

## 2022-11-30 VITALS — BP 128/62 | HR 75 | Temp 98.3°F | Wt 280.9 lb

## 2022-11-30 DIAGNOSIS — E785 Hyperlipidemia, unspecified: Secondary | ICD-10-CM | POA: Insufficient documentation

## 2022-11-30 DIAGNOSIS — K760 Fatty (change of) liver, not elsewhere classified: Secondary | ICD-10-CM | POA: Insufficient documentation

## 2022-11-30 DIAGNOSIS — C9000 Multiple myeloma not having achieved remission: Secondary | ICD-10-CM | POA: Diagnosis not present

## 2022-11-30 DIAGNOSIS — R161 Splenomegaly, not elsewhere classified: Secondary | ICD-10-CM | POA: Diagnosis not present

## 2022-11-30 DIAGNOSIS — Z87442 Personal history of urinary calculi: Secondary | ICD-10-CM | POA: Diagnosis not present

## 2022-11-30 DIAGNOSIS — E1142 Type 2 diabetes mellitus with diabetic polyneuropathy: Secondary | ICD-10-CM | POA: Diagnosis not present

## 2022-11-30 DIAGNOSIS — D509 Iron deficiency anemia, unspecified: Secondary | ICD-10-CM | POA: Diagnosis not present

## 2022-11-30 DIAGNOSIS — K7460 Unspecified cirrhosis of liver: Secondary | ICD-10-CM | POA: Insufficient documentation

## 2022-11-30 DIAGNOSIS — D696 Thrombocytopenia, unspecified: Secondary | ICD-10-CM | POA: Diagnosis not present

## 2022-11-30 DIAGNOSIS — Z5112 Encounter for antineoplastic immunotherapy: Secondary | ICD-10-CM | POA: Diagnosis not present

## 2022-11-30 DIAGNOSIS — Z79624 Long term (current) use of inhibitors of nucleotide synthesis: Secondary | ICD-10-CM | POA: Insufficient documentation

## 2022-11-30 DIAGNOSIS — Z95828 Presence of other vascular implants and grafts: Secondary | ICD-10-CM

## 2022-11-30 DIAGNOSIS — Z85828 Personal history of other malignant neoplasm of skin: Secondary | ICD-10-CM | POA: Diagnosis not present

## 2022-11-30 DIAGNOSIS — Z87891 Personal history of nicotine dependence: Secondary | ICD-10-CM | POA: Diagnosis not present

## 2022-11-30 DIAGNOSIS — I10 Essential (primary) hypertension: Secondary | ICD-10-CM | POA: Insufficient documentation

## 2022-11-30 DIAGNOSIS — Z7961 Long term (current) use of immunomodulator: Secondary | ICD-10-CM | POA: Insufficient documentation

## 2022-11-30 DIAGNOSIS — Z800 Family history of malignant neoplasm of digestive organs: Secondary | ICD-10-CM | POA: Diagnosis not present

## 2022-11-30 DIAGNOSIS — D72819 Decreased white blood cell count, unspecified: Secondary | ICD-10-CM | POA: Diagnosis not present

## 2022-11-30 DIAGNOSIS — Z8041 Family history of malignant neoplasm of ovary: Secondary | ICD-10-CM | POA: Insufficient documentation

## 2022-11-30 LAB — CBC WITH DIFFERENTIAL/PLATELET
Abs Immature Granulocytes: 0.01 10*3/uL (ref 0.00–0.07)
Basophils Absolute: 0 10*3/uL (ref 0.0–0.1)
Basophils Relative: 0 %
Eosinophils Absolute: 0 10*3/uL (ref 0.0–0.5)
Eosinophils Relative: 1 %
HCT: 39.3 % (ref 36.0–46.0)
Hemoglobin: 12.5 g/dL (ref 12.0–15.0)
Immature Granulocytes: 0 %
Lymphocytes Relative: 33 %
Lymphs Abs: 1.1 10*3/uL (ref 0.7–4.0)
MCH: 32.1 pg (ref 26.0–34.0)
MCHC: 31.8 g/dL (ref 30.0–36.0)
MCV: 101 fL — ABNORMAL HIGH (ref 80.0–100.0)
Monocytes Absolute: 0.4 10*3/uL (ref 0.1–1.0)
Monocytes Relative: 11 %
Neutro Abs: 1.8 10*3/uL (ref 1.7–7.7)
Neutrophils Relative %: 55 %
Platelets: 104 10*3/uL — ABNORMAL LOW (ref 150–400)
RBC: 3.89 MIL/uL (ref 3.87–5.11)
RDW: 15.1 % (ref 11.5–15.5)
WBC: 3.3 10*3/uL — ABNORMAL LOW (ref 4.0–10.5)
nRBC: 0 % (ref 0.0–0.2)

## 2022-11-30 LAB — COMPREHENSIVE METABOLIC PANEL
ALT: 23 U/L (ref 0–44)
AST: 21 U/L (ref 15–41)
Albumin: 3.2 g/dL — ABNORMAL LOW (ref 3.5–5.0)
Alkaline Phosphatase: 121 U/L (ref 38–126)
Anion gap: 9 (ref 5–15)
BUN: 11 mg/dL (ref 8–23)
CO2: 26 mmol/L (ref 22–32)
Calcium: 8.3 mg/dL — ABNORMAL LOW (ref 8.9–10.3)
Chloride: 102 mmol/L (ref 98–111)
Creatinine, Ser: 0.63 mg/dL (ref 0.44–1.00)
GFR, Estimated: >60 mL/min (ref >60)
Glucose, Bld: 205 mg/dL — ABNORMAL HIGH (ref 70–99)
Potassium: 3.8 mmol/L (ref 3.5–5.1)
Sodium: 137 mmol/L (ref 135–145)
Total Bilirubin: 0.4 mg/dL (ref 0.3–1.2)
Total Protein: 5.7 g/dL — ABNORMAL LOW (ref 6.5–8.1)

## 2022-11-30 LAB — MAGNESIUM: Magnesium: 1.9 mg/dL (ref 1.7–2.4)

## 2022-11-30 LAB — SAMPLE TO BLOOD BANK

## 2022-11-30 MED ORDER — HEPARIN SOD (PORK) LOCK FLUSH 100 UNIT/ML IV SOLN: 500.0000 [IU] | Freq: Once | INTRAVENOUS | Status: DC

## 2022-11-30 MED ORDER — SODIUM CHLORIDE 0.9% FLUSH: 10.0000 mL | INTRAVENOUS | Status: DC | PRN

## 2022-11-30 MED ORDER — DARATUMUMAB-HYALURONIDASE-FIHJ 1800-30000 MG-UT/15ML ~~LOC~~ SOLN
1800.0000 mg | Freq: Once | SUBCUTANEOUS | Status: AC
  Administered 2022-11-30: 1800 mg via SUBCUTANEOUS
  Filled 2022-11-30: qty 15

## 2022-11-30 NOTE — Patient Instructions (Addendum)
[facility] at [facility] Discharge Instructions   You were seen and examined today by Dr. Ellin Saba.  He reviewed the results of your lab work which are normal/stable.   We will proceed with your treatment today.   Continue Revlimid as prescribed.   Get some Biotene mouthwash and use every few hours throughout the day, especially after meals and at bedtime to help heal the irritation in your mouth.   Return as scheduled.    Thank you for choosing [facility] at [facility] to provide your oncology and hematology care.  To afford each patient quality time with our provider, please arrive at least 15 minutes before your scheduled appointment time.   If you have a lab appointment with the Cancer Center please come in thru the Main Entrance and check in at the main information desk.  You need to re-schedule your appointment should you arrive 10 or more minutes late.  We strive to give you quality time with our providers, and arriving late affects you and other patients whose appointments are after yours.  Also, if you no show three or more times for appointments you may be dismissed from the clinic at the providers discretion.     Again, thank you for choosing [facility].  Our hope is that these requests will decrease the amount of time that you wait before being seen by our physicians.       _____________________________________________________________  Should you have questions after your visit to [facility], please contact our office at [phone] and follow the prompts.  Our office hours are 8:00 a.m. and 4:30 p.m. Monday - Friday.  Please note that voicemails left after 4:00 p.m. may not be returned until the following business day.  We are closed weekends and major holidays.  You do have access to a nurse 24-7, just call the main number to the clinic [phone] and do not press any options, hold on the line  and a nurse will answer the phone.    For prescription refill requests, have your pharmacy contact our office and allow 72 hours.    Due to Covid, you will need to wear a mask upon entering the hospital. If you do not have a mask, a mask will be given to you at the Main Entrance upon arrival. For doctor visits, patients may have 1 support person age 60 or older with them. For treatment visits, patients can not have anyone with them due to social distancing guidelines and our immunocompromised population.

## 2022-11-30 NOTE — Patient Instructions (Signed)
[facility]  Discharge Instructions: Thank you for choosing [facility] to provide your oncology and hematology care.  If you have a lab appointment with the Cancer Center - please note that after April 8th, 2024, all labs will be drawn in the cancer center.  You do not have to check in or register with the main entrance as you have in the past but will complete your check-in in the cancer center.  Wear comfortable clothing and clothing appropriate for easy access to any Portacath or PICC line.   We strive to give you quality time with your provider. You may need to reschedule your appointment if you arrive late (15 or more minutes).  Arriving late affects you and other patients whose appointments are after yours.  Also, if you miss three or more appointments without notifying the office, you may be dismissed from the clinic at the provider's discretion.      For prescription refill requests, have your pharmacy contact our office and allow 72 hours for refills to be completed.    Today you received the following chemotherapy and/or immunotherapy agents Daratumumab.  Daratumumab; Hyaluronidase Injection What is this medication? DARATUMUMAB; HYALURONIDASE (dar a toom ue mab; hye al ur ON i dase) treats multiple myeloma, a type of bone marrow cancer. Daratumumab works by blocking a protein that causes cancer cells to grow and multiply. This helps to slow or stop the spread of cancer cells. Hyaluronidase works by increasing the absorption of other medications in the body to help them work better. This medication may also be used treat amyloidosis, a condition that causes the buildup of a protein (amyloid) in your body. It works by reducing the buildup of this protein, which decreases symptoms. It is a combination medication that contains a monoclonal antibody. This medicine may be used for other purposes; ask your health care provider or pharmacist if you have  questions. COMMON BRAND NAME(S): DARZALEX FASPRO What should I tell my care team before I take this medication? They need to know if you have any of these conditions: Heart disease Infection, such as chickenpox, cold sores, herpes, hepatitis B Lung or breathing disease An unusual or allergic reaction to daratumumab, hyaluronidase, other medications, foods, dyes, or preservatives Pregnant or trying to get pregnant Breast-feeding How should I use this medication? This medication is injected under the skin. It is given by your care team in a hospital or clinic setting. Talk to your care team about the use of this medication in children. Special care may be needed. Overdosage: If you think you have taken too much of this medicine contact a poison control center or emergency room at once. NOTE: This medicine is only for you. Do not share this medicine with others. What if I miss a dose? Keep appointments for follow-up doses. It is important not to miss your dose. Call your care team if you are unable to keep an appointment. What may interact with this medication? Interactions have not been studied. This list may not describe all possible interactions. Give your health care provider a list of all the medicines, herbs, non-prescription drugs, or dietary supplements you use. Also tell them if you smoke, drink alcohol, or use illegal drugs. Some items may interact with your medicine. What should I watch for while using this medication? Your condition will be monitored carefully while you are receiving this medication. This medication can cause serious allergic reactions. To reduce your risk, your care team may   give you other medication to take before receiving this one. Be sure to follow the directions from your care team. This medication can affect the results of blood tests to match your blood type. These changes can last for up to 6 months after the final dose. Your care team will do blood tests to  match your blood type before you start treatment. Tell all of your care team that you are being treated with this medication before receiving a blood transfusion. This medication can affect the results of some tests used to determine treatment response; extra tests may be needed to evaluate response. Talk to your care team if you wish to become pregnant or think you are pregnant. This medication can cause serious birth defects if taken during pregnancy and for 3 months after the last dose. A reliable form of contraception is recommended while taking this medication and for 3 months after the last dose. Talk to your care team about effective forms of contraception. Do not breast-feed while taking this medication. What side effects may I notice from receiving this medication? Side effects that you should report to your care team as soon as possible: Allergic reactions--skin rash, itching, hives, swelling of the face, lips, tongue, or throat Heart rhythm changes--fast or irregular heartbeat, dizziness, feeling faint or lightheaded, chest pain, trouble breathing Infection--fever, chills, cough, sore throat, wounds that don't heal, pain or trouble when passing urine, general feeling of discomfort or being unwell Infusion reactions--chest pain, shortness of breath or trouble breathing, feeling faint or lightheaded Sudden eye pain or change in vision such as blurry vision, seeing halos around lights, vision loss Unusual bruising or bleeding Side effects that usually do not require medical attention (report to your care team if they continue or are bothersome): Constipation Diarrhea Fatigue Nausea Pain, tingling, or numbness in the hands or feet Swelling of the ankles, hands, or feet This list may not describe all possible side effects. Call your doctor for medical advice about side effects. You may report side effects to FDA at 1-800-FDA-1088. Where should I keep my medication? This medication is given  in a hospital or clinic. It will not be stored at home. NOTE: This sheet is a summary. It may not cover all possible information. If you have questions about this medicine, talk to your doctor, pharmacist, or health care provider.  2024 Elsevier/Gold Standard (2021-07-20 00:00:00)        To help prevent nausea and vomiting after your treatment, we encourage you to take your nausea medication as directed.  BELOW ARE SYMPTOMS THAT SHOULD BE REPORTED IMMEDIATELY: *FEVER GREATER THAN 100.4 F (38 C) OR HIGHER *CHILLS OR SWEATING *NAUSEA AND VOMITING THAT IS NOT CONTROLLED WITH YOUR NAUSEA MEDICATION *UNUSUAL SHORTNESS OF BREATH *UNUSUAL BRUISING OR BLEEDING *URINARY PROBLEMS (pain or burning when urinating, or frequent urination) *BOWEL PROBLEMS (unusual diarrhea, constipation, pain near the anus) TENDERNESS IN MOUTH AND THROAT WITH OR WITHOUT PRESENCE OF ULCERS (sore throat, sores in mouth, or a toothache) UNUSUAL RASH, SWELLING OR PAIN  UNUSUAL VAGINAL DISCHARGE OR ITCHING   Items with * indicate a potential emergency and should be followed up as soon as possible or go to the Emergency Department if any problems should occur.  Please show the CHEMOTHERAPY ALERT CARD or IMMUNOTHERAPY ALERT CARD at check-in to the Emergency Department and triage nurse.  Should you have questions after your visit or need to cancel or reschedule your appointment, please contact [facility] [phone]  and follow   the prompts.  Office hours are 8:00 a.m. to 4:30 p.m. Monday - Friday. Please note that voicemails left after 4:00 p.m. may not be returned until the following business day.  We are closed weekends and major holidays. You have access to a nurse at all times for urgent questions. Please call the main number to the clinic [phone] and follow the prompts.  For any non-urgent questions, you may also contact your provider using MyChart. We now offer e-Visits for anyone 18 and older  to request care online for non-urgent symptoms. For details visit [email or web address].   Also download the MyChart app! Go to the app store, search "MyChart", open the app, select , and log in with your MyChart username and password.   

## 2022-11-30 NOTE — Progress Notes (Signed)
Patient presents today for chemotherapy infusion. Patient is in satisfactory condition with no new complaints voiced.  Vital signs are stable.  Labs reviewed by Dr. Ellin Saba during the office visit and all labs are within treatment parameters. PICC line dressing changed today.  Tylenol and Benadryl were taken at 0830 at home prior to visit.  We will proceed with treatment per MD orders.   Patient tolerated injection well with no complaints voiced.  Patient left via wheelchair with granddaughter in stable condition.  Vital signs stable at discharge.  Follow up as scheduled.

## 2022-11-30 NOTE — Progress Notes (Signed)
Patient is taking Revlimid as prescribed.  She has not missed any doses and reports no side effects at this time.    Patient has been examined by Dr. Katragadda. Vital signs and labs have been reviewed by MD - ANC, Creatinine, LFTs, hemoglobin, and platelets are within treatment parameters per M.D. - pt may proceed with treatment.  Primary RN and pharmacy notified.  

## 2022-12-01 ENCOUNTER — Other Ambulatory Visit: Payer: Self-pay

## 2022-12-02 ENCOUNTER — Ambulatory Visit: Payer: Medicare PPO | Admitting: Family Medicine

## 2022-12-02 ENCOUNTER — Encounter: Payer: Self-pay | Admitting: Family Medicine

## 2022-12-02 VITALS — BP 140/73 | HR 66 | Temp 98.4°F | Ht 62.0 in | Wt 283.0 lb

## 2022-12-02 DIAGNOSIS — E1169 Type 2 diabetes mellitus with other specified complication: Secondary | ICD-10-CM | POA: Diagnosis not present

## 2022-12-02 DIAGNOSIS — Z7985 Long-term (current) use of injectable non-insulin antidiabetic drugs: Secondary | ICD-10-CM

## 2022-12-02 DIAGNOSIS — L02419 Cutaneous abscess of limb, unspecified: Secondary | ICD-10-CM | POA: Diagnosis not present

## 2022-12-02 DIAGNOSIS — Z87898 Personal history of other specified conditions: Secondary | ICD-10-CM

## 2022-12-02 MED ORDER — CEFTRIAXONE SODIUM 1 G IJ SOLR
1.0000 g | Freq: Once | INTRAMUSCULAR | Status: AC
Start: 2022-12-02 — End: 2022-12-02
  Administered 2022-12-02: 1 g via INTRAMUSCULAR

## 2022-12-02 MED ORDER — CLINDAMYCIN PHOSPHATE 1 % EX SWAB: CUTANEOUS | 3 refills | Status: DC

## 2022-12-02 MED ORDER — FLUCONAZOLE 150 MG PO TABS
150.0000 mg | ORAL_TABLET | Freq: Once | ORAL | 0 refills | Status: AC
Start: 2022-12-02 — End: 2022-12-02

## 2022-12-02 MED ORDER — CEFDINIR 300 MG PO CAPS
300.0000 mg | ORAL_CAPSULE | Freq: Two times a day (BID) | ORAL | 0 refills | Status: DC
Start: 2022-12-02 — End: 2022-12-15

## 2022-12-02 MED ORDER — OZEMPIC (0.25 OR 0.5 MG/DOSE) 2 MG/3ML ~~LOC~~ SOPN: 0.2500 mg | PEN_INJECTOR | SUBCUTANEOUS | 3 refills | Status: DC

## 2022-12-02 NOTE — Patient Instructions (Signed)
Start oral antibiotic TOMORROW. Ok to start topical swab today if you want. Use warm compresses to promote drainage of abscess.  If progresses, seek IMMEDIATE medical attention.  I will message Dr Drue Second as Lorain Childes as well.

## 2022-12-02 NOTE — Progress Notes (Signed)
Subjective: CC: Abscess PCP: Savannah Ip, DO NUU:[id number] JAKENYA THRONEBERRY is a 72 y.o. female presenting to clinic today for:  1.  Abscess Patient reports that she started developing an abscess under her right axilla again over the last couple of days.  She notes initially was just irritated but then became sore.  She reports no fevers.  No drainage.  She was recently hospitalized in July for similar where she had staph bacteremia and was on cefazolin via PICC line for a month.  She still has PICC line in place because they had to take out her port due to bacteremia.  She is getting her cancer treatment through that PICC line now.  Was previously under the care of Dr. Drue Burns with infectious disease, who was managing the cefazolin after bacteremia   ROS: Per HPI  Allergies  Allergen Reactions   Keflex [Cephalexin] Nausea And Vomiting   Past Medical History:  Diagnosis Date   Adrenal adenoma, left    Stable   Anxiety    Arthritis    bilateral hands   Depression    Diabetes mellitus, type 2 (HCC) 08/12/2008   Qualifier: Diagnosis of  By: Savannah Martes MD, Savannah     Dyspnea    Esophageal varices (HCC)    Grade II diastolic dysfunction    History of kidney stones    Hyperlipidemia    Hypertension    Lower back pain    Lower GI bleed 03/19/2020   Panic attacks    Pneumonia    currently taking antibiotic and prednisone for early stages of pneumonia   Pulmonary nodules    bilateral   Skin cancer    face    Current Outpatient Medications:    acyclovir (ZOVIRAX) 400 MG tablet, Take 1 tablet (400 mg total) by mouth 2 (two) times daily., Disp: 60 tablet, Rfl: 6   baclofen (LIORESAL) 10 MG tablet, Take 10 mg by mouth 2 (two) times daily., Disp: , Rfl:    Blood Glucose Calibration (TRUE METRIX LEVEL 1) Low SOLN, Use w/ glucose monitor Dx E11.9, Disp: 3 each, Rfl: 0   Blood Glucose Monitoring Suppl (TRUE METRIX AIR GLUCOSE METER) w/Device KIT, Check BS daily Dx E11.9, Disp: 1 kit, Rfl: 0    cefdinir (OMNICEF) 300 MG capsule, Take 1 capsule (300 mg total) by mouth 2 (two) times daily. 1 po BID, Disp: 20 capsule, Rfl: 0   Cholecalciferol (VITAMIN D) 50 MCG (2000 UT) tablet, Take 2,000 Units by mouth daily., Disp: , Rfl:    clindamycin (CLEOCIN T) 1 % SWAB, Apply to affected areas under axilla twice daily, Disp: 180 each, Rfl: 3   clobetasol (TEMOVATE) 0.05 % external solution, Apply 1 Application topically 2 (two) times daily., Disp: , Rfl:    Cyanocobalamin (B-12 COMPLIANCE INJECTION) 1000 MCG/ML KIT, Inject 1,000 mcg as directed every 30 (thirty) days., Disp: , Rfl:    dexamethasone (DECADRON) 4 MG tablet, Take 5 tablets (20 mg total) by mouth once a week., Disp: 20 tablet, Rfl: 3   esomeprazole (NEXIUM) 40 MG capsule, Take 1 capsule (40 mg total) by mouth daily., Disp: 30 capsule, Rfl: 2   fluconazole (DIFLUCAN) 150 MG tablet, Take 1 tablet (150 mg total) by mouth once for 1 dose., Disp: 1 tablet, Rfl: 0   glucose blood (TRUE METRIX BLOOD GLUCOSE TEST) test strip, Check BS daily Dx E11.9, Disp: 100 each, Rfl: 3   lenalidomide (REVLIMID) 15 MG capsule, TAKE 1 CAPSULE BY MOUTH EVERY DAY FOR  21 DAYS ON THEN 7 DAYS OFF, Disp: 21 capsule, Rfl: 0   lidocaine-prilocaine (EMLA) cream, Apply 1 Application topically as needed. Apply a small amount to port a cath site and cover with plastic wrap 1 hour prior to infusion appointments, Disp: 30 g, Rfl: 3   lovastatin (MEVACOR) 20 MG tablet, Take 1 tablet (20 mg total) by mouth at bedtime., Disp: 90 tablet, Rfl: 3   mupirocin ointment (BACTROBAN) 2 %, Apply 1 Application topically 2 (two) times daily., Disp: 22 g, Rfl: 1   nystatin (MYCOSTATIN/NYSTOP) powder, Apply topically 2 (two) times daily., Disp: 15 g, Rfl: 0   oxyCODONE-acetaminophen (PERCOCET) 10-325 MG tablet, Take 1 tablet by mouth every 6 (six) hours as needed for pain., Disp: , Rfl:    OXYGEN, Inhale 3 L into the lungs continuous., Disp: , Rfl:    potassium chloride SA (KLOR-CON) 20 MEQ  tablet, Take 2 tablets (40 mEq total) by mouth 2 (two) times daily. (Patient taking differently: Take 40 mEq by mouth daily.), Disp: 360 tablet, Rfl: 3   prochlorperazine (COMPAZINE) 10 MG tablet, Take 1 tablet (10 mg total) by mouth every 6 (six) hours as needed for nausea or vomiting., Disp: 30 tablet, Rfl: 6   torsemide (DEMADEX) 20 MG tablet, Take 2.5 tablets (50 mg total) by mouth 2 (two) times daily., Disp: , Rfl:    TRUEplus Lancets 33G MISC, Check BS daily Dx E11.9, Disp: 100 each, Rfl: 3   venlafaxine XR (EFFEXOR-XR) 150 MG 24 hr capsule, Take 1 capsule (150 mg total) by mouth daily with breakfast., Disp: 30 capsule, Rfl: 2   ferrous sulfate 325 (65 FE) MG tablet, Take 1 tablet (325 mg total) by mouth daily with breakfast., Disp: 30 tablet, Rfl: 3   OZEMPIC, 0.25 OR 0.5 MG/DOSE, 2 MG/3ML SOPN, Inject 0.25 mg into the skin every 7 (seven) days., Disp: 9 mL, Rfl: 3  Current Facility-Administered Medications:    cefTRIAXone (ROCEPHIN) injection 1 g, 1 g, Intramuscular, Once,   Facility-Administered Medications Ordered in Other Visits:    sodium chloride flush (NS) 0.9 % injection 10 mL, 10 mL, Intravenous, PRN, Burns, Savannah M, PA-C, 10 mL at 03/08/22 1344 Social History   Socioeconomic History   Marital status: Widowed    Spouse name: Not on file   Number of children: 2   Years of education: 14   Highest education level: Not on file  Occupational History   Occupation: Retired   Tobacco Use   Smoking status: Former    Current packs/day: 0.00    Average packs/day: 1.5 packs/day for 40.0 years (60.0 ttl pk-yrs)    Types: Cigarettes    Start date: 04/29/1975    Quit date: 04/29/2015    Years since quitting: 7.6   Smokeless tobacco: Never   Tobacco comments:    Quit smoking 04/2015- Previous 1.5 ppd smoker  Vaping Use   Vaping status: Never Used  Substance and Sexual Activity   Alcohol use: No    Alcohol/week: 0.0 standard drinks of alcohol   Drug use: No   Sexual activity:  Not Currently    Birth control/protection: Post-menopausal  Other Topics Concern   Not on file  Social History Narrative   Her 58 year old granddaughter lives with her - one daughter lives nearby, but she doesn't have a good relationship with her. Has a great relationship with other daughter who lives 1.5 hrs away - talks to her daily on the phone.   Social Determinants of Health  Financial Resource Strain: High Risk (11/15/2022)   Overall Financial Resource Strain (CARDIA)    Difficulty of Paying Living Expenses: Very hard  Food Insecurity: Food Insecurity Present (11/15/2022)   Hunger Vital Sign    Worried About Running Out of Food in the Last Year: Sometimes true    Ran Out of Food in the Last Year: Never true  Transportation Needs: No Transportation Needs (11/15/2022)   PRAPARE - Administrator, Civil Service (Medical): No    Lack of Transportation (Non-Medical): No  Physical Activity: Inactive (07/27/2021)   Exercise Vital Sign    Days of Exercise per Week: 0 days    Minutes of Exercise per Session: 0 min  Stress: Stress Concern Present (07/27/2021)   [employer] of Occupational Health - Occupational Stress Questionnaire    Feeling of Stress : To some extent  Social Connections: Socially Isolated (07/27/2021)   Social Connection and Isolation Panel [NHANES]    Frequency of Communication with Friends and Family: More than three times a week    Frequency of Social Gatherings with Friends and Family: Once a week    Attends Religious Services: Never    Database administrator or Organizations: No    Attends Banker Meetings: Never    Marital Status: Widowed  Intimate Partner Violence: Not At Risk (09/26/2022)   Humiliation, Afraid, Rape, and Kick questionnaire    Fear of Current or Ex-Partner: No    Emotionally Abused: No    Physically Abused: No    Sexually Abused: No   Family History  Problem Relation Age of Onset   Diabetes Father    Heart disease  Father 44       MI   Hypertension Father    Anemia Mother        Transfusion dependent   COPD Sister    Cancer Paternal Grandmother 34       Pancreatic    Objective: Office vital signs reviewed. BP (!) 140/73   Pulse 66   Temp 98.4 F (36.9 C)   Ht 5\' 2"  (1.575 m)   Wt 283 lb (128.4 kg)   SpO2 97%   BMI 51.76 kg/m   Physical Examination:  General: Awake, alert, morbidly obese, No acute distress Skin: Abscess appreciated along the apex of the right axillary region closer to the upper extremity than the actual axilla.  There is a central punctum without drainage.  There is erythema, warmth and induration but no palpable fluctuance or draining  Assessment/ Plan: 72 y.o. female   Axillary abscess - Plan: cefTRIAXone (ROCEPHIN) injection 1 g, cefdinir (OMNICEF) 300 MG capsule, clindamycin (CLEOCIN T) 1 % SWAB, fluconazole (DIFLUCAN) 150 MG tablet  History of bacteremia - Plan: cefTRIAXone (ROCEPHIN) injection 1 g, cefdinir (OMNICEF) 300 MG capsule, clindamycin (CLEOCIN T) 1 % SWAB  Type 2 diabetes mellitus with other specified complication, without long-term current use of insulin (HCC) - Plan: OZEMPIC, 0.25 OR 0.5 MG/DOSE, 2 MG/3ML SOPN  Long-term current use of injectable noninsulin antidiabetic medication  Abscess present.  Discussed warm compresses to promote drainage.  No palpable fluctuance but punctum was appreciated on exam.  I treated with a dose of Rocephin intramuscularly and placed her on Omnicef orally.  She apparently has a nausea and vomiting with oral Keflex and I did not feel comfortable placing her on this even with an antinausea medication over the weekend for fear that she would not be able to tolerate med and this infection may  progress.  I will CC Dr. Drue Burns as Lorain Childes.  I also prescribed patient topical clindamycin pledgets to apply to the affected areas twice daily going forward in hopes that we might prevent some of these lesions from occurring.  We discussed  that morbid obesity certainly contributes.  Keep areas clean and dry.  We did not discuss her diabetes in detail today but needed refills on Ozempic.  I reviewed her last A1c.  Rx sent to pharmacy   Savannah Ip, DO [facility] [phone]

## 2022-12-05 ENCOUNTER — Telehealth: Payer: Self-pay | Admitting: Family Medicine

## 2022-12-05 ENCOUNTER — Telehealth: Payer: Self-pay | Admitting: *Deleted

## 2022-12-05 NOTE — Telephone Encounter (Signed)
Patient left message making [place] aware that she saw PCP Friday and has more boils that have come up on her skin.  She was started on Omnicef and is off of her Revlimid due to antibiotic use.  Dr. Ellin Saba made aware.

## 2022-12-05 NOTE — Telephone Encounter (Signed)
Pt called to let PCP know that she has been taking the antibiotic prescribed to her and has developed a yeast infection from taking it. Needs yeast infection Rx sent to pharmacy.

## 2022-12-05 NOTE — Telephone Encounter (Signed)
This med was already sent with her initial prescription.  Please have her check with pharmacy as this should be available for pickup

## 2022-12-06 NOTE — Telephone Encounter (Signed)
Attempted to call pt call would not go through

## 2022-12-07 ENCOUNTER — Ambulatory Visit: Payer: Medicare PPO | Admitting: Cardiology

## 2022-12-07 ENCOUNTER — Other Ambulatory Visit: Payer: Self-pay

## 2022-12-07 NOTE — Progress Notes (Deleted)
Clinical Summary Ms. Lovings is a 72 y.o.female  1. SOB/hypoxia - ongoing workup by pcp. Suspicion of possible COPD, OHS playing a role. - awaitin pulmonary evaluation       2. Chronic diastolic HF - 12/2014 echo LVEF 60-65%, grade II diastolic dysfunction - she is on lasix 20mg  bid   09/2022 echo: LVEF 65-70%, no WMAs, grade I dd, normal RV, mildly elevated PASP   - long history of LE edema that has worst - has been on lasix for several years - DOE at just a few steps, new over the last 8 months - 12/2017 weight 284. 303 lbs today.  - sleeps in recliner 1 year, more so for back pains.  - no chest pain.    3.Aortic stenosis 09/2022 echo: LVEF 60-65%, mild AS mean grad 18, AVA VTI 1.87  4.HTN  5. HLD   6. Iron deficiency anemia - history of GI bleed   7. Multiple myeloma  8.History of MSSA bacteremia related to port a cath - followed by ID, completed abx treatment  Past Medical History:  Diagnosis Date   Adrenal adenoma, left    Stable   Anxiety    Arthritis    bilateral hands   Depression    Diabetes mellitus, type 2 (HCC) 08/12/2008   Qualifier: Diagnosis of  By: Dayton Martes MD, Talia     Dyspnea    Esophageal varices (HCC)    Grade II diastolic dysfunction    History of kidney stones    Hyperlipidemia    Hypertension    Lower back pain    Lower GI bleed 03/19/2020   Panic attacks    Pneumonia    currently taking antibiotic and prednisone for early stages of pneumonia   Pulmonary nodules    bilateral   Skin cancer    face     Allergies  Allergen Reactions   Keflex [Cephalexin] Nausea And Vomiting     Current Outpatient Medications  Medication Sig Dispense Refill   acyclovir (ZOVIRAX) 400 MG tablet Take 1 tablet (400 mg total) by mouth 2 (two) times daily. 60 tablet 6   baclofen (LIORESAL) 10 MG tablet Take 10 mg by mouth 2 (two) times daily.     Blood Glucose Calibration (TRUE METRIX LEVEL 1) Low SOLN Use w/ glucose monitor Dx E11.9 3  each 0   Blood Glucose Monitoring Suppl (TRUE METRIX AIR GLUCOSE METER) w/Device KIT Check BS daily Dx E11.9 1 kit 0   cefdinir (OMNICEF) 300 MG capsule Take 1 capsule (300 mg total) by mouth 2 (two) times daily. 1 po BID 20 capsule 0   Cholecalciferol (VITAMIN D) 50 MCG (2000 UT) tablet Take 2,000 Units by mouth daily.     clindamycin (CLEOCIN T) 1 % SWAB Apply to affected areas under axilla twice daily 180 each 3   clobetasol (TEMOVATE) 0.05 % external solution Apply 1 Application topically 2 (two) times daily.     Cyanocobalamin (B-12 COMPLIANCE INJECTION) 1000 MCG/ML KIT Inject 1,000 mcg as directed every 30 (thirty) days.     dexamethasone (DECADRON) 4 MG tablet Take 5 tablets (20 mg total) by mouth once a week. 20 tablet 3   esomeprazole (NEXIUM) 40 MG capsule Take 1 capsule (40 mg total) by mouth daily. 30 capsule 2   ferrous sulfate 325 (65 FE) MG tablet Take 1 tablet (325 mg total) by mouth daily with breakfast. 30 tablet 3   glucose blood (TRUE METRIX BLOOD GLUCOSE TEST) test  strip Check BS daily Dx E11.9 100 each 3   lenalidomide (REVLIMID) 15 MG capsule TAKE 1 CAPSULE BY MOUTH EVERY DAY FOR 21 DAYS ON THEN 7 DAYS OFF 21 capsule 0   lidocaine-prilocaine (EMLA) cream Apply 1 Application topically as needed. Apply a small amount to port a cath site and cover with plastic wrap 1 hour prior to infusion appointments 30 g 3   lovastatin (MEVACOR) 20 MG tablet Take 1 tablet (20 mg total) by mouth at bedtime. 90 tablet 3   mupirocin ointment (BACTROBAN) 2 % Apply 1 Application topically 2 (two) times daily. 22 g 1   nystatin (MYCOSTATIN/NYSTOP) powder Apply topically 2 (two) times daily. 15 g 0   oxyCODONE-acetaminophen (PERCOCET) 10-325 MG tablet Take 1 tablet by mouth every 6 (six) hours as needed for pain.     OXYGEN Inhale 3 L into the lungs continuous.     OZEMPIC, 0.25 OR 0.5 MG/DOSE, 2 MG/3ML SOPN Inject 0.25 mg into the skin every 7 (seven) days. 9 mL 3   potassium chloride SA  (KLOR-CON) 20 MEQ tablet Take 2 tablets (40 mEq total) by mouth 2 (two) times daily. (Patient taking differently: Take 40 mEq by mouth daily.) 360 tablet 3   prochlorperazine (COMPAZINE) 10 MG tablet Take 1 tablet (10 mg total) by mouth every 6 (six) hours as needed for nausea or vomiting. 30 tablet 6   torsemide (DEMADEX) 20 MG tablet Take 2.5 tablets (50 mg total) by mouth 2 (two) times daily.     TRUEplus Lancets 33G MISC Check BS daily Dx E11.9 100 each 3   venlafaxine XR (EFFEXOR-XR) 150 MG 24 hr capsule Take 1 capsule (150 mg total) by mouth daily with breakfast. 30 capsule 2   No current facility-administered medications for this visit.   Facility-Administered Medications Ordered in Other Visits  Medication Dose Route Frequency Provider Last Rate Last Admin   sodium chloride flush (NS) 0.9 % injection 10 mL  10 mL Intravenous PRN Rojelio Brenner M, PA-C   10 mL at 03/08/22 1344     Past Surgical History:  Procedure Laterality Date   BIOPSY  04/07/2020   Procedure: BIOPSY;  Surgeon: Lanelle Bal, DO;  [facility]: [facility];  Service: Endoscopy;;   Breast Cystectomy  Right    CESAREAN SECTION     COLONOSCOPY WITH PROPOFOL N/A 01/25/2020   Dr. Marletta Lor: Nonbleeding internal hemorrhoids, diverticulosis, 5 mm polyp removed from the ascending colon, 10 mm polyp removed from the sigmoid colon, 30 mm polyp (tubulovillous adenoma with no high-grade dysplasia) removed from the transverse colon via piecemeal status post tattoo.  Other polyps were tubular adenomas.  3 month surveillance colonoscopy recommended.   COLONOSCOPY WITH PROPOFOL N/A 04/07/2020   Procedure: COLONOSCOPY WITH PROPOFOL;  Surgeon: Lanelle Bal, DO;  [facility]: [facility];  Service: Endoscopy;  Laterality: N/A;  3:00pm, pt knows new time per office   CYSTOSCOPY/URETEROSCOPY/HOLMIUM LASER/STENT PLACEMENT Bilateral 03/01/2019   Procedure: CYSTOSCOPY/RETROGRADEURETEROSCOPY/HOLMIUM LASER/STENT PLACEMENT;  Surgeon:  Rene Paci, MD;  Location: [facility];  Service: Urology;  Laterality: Bilateral;  ONLY NEEDS 60 MIN   ESOPHAGOGASTRODUODENOSCOPY (EGD) WITH PROPOFOL N/A 01/25/2020   Dr. Marletta Lor: 4 columns grade 1 esophageal varices   ESOPHAGOGASTRODUODENOSCOPY (EGD) WITH PROPOFOL N/A 05/18/2020   Procedure: ESOPHAGOGASTRODUODENOSCOPY (EGD) WITH PROPOFOL;  Surgeon: Lanelle Bal, DO;  [facility];  Service: Endoscopy;  Laterality: N/A;   ESOPHAGOGASTRODUODENOSCOPY (EGD) WITH PROPOFOL N/A 07/28/2020   Procedure: ESOPHAGOGASTRODUODENOSCOPY (EGD) WITH PROPOFOL;  Surgeon: Lanelle Bal, DO;  [facility];  Service: Endoscopy;  Laterality: N/A;  am or early PM due to givens capsule placement   GIVENS CAPSULE STUDY N/A 05/18/2020   Procedure: GIVENS CAPSULE STUDY;  Surgeon: Dolores Frame, MD;  [facility];  Service: Gastroenterology;  Laterality: N/A;   GIVENS CAPSULE STUDY N/A 07/28/2020   Procedure: GIVENS CAPSULE STUDY;  Surgeon: Lanelle Bal, DO;  [facility];  Service: Endoscopy;  Laterality: N/A;   IR IMAGING GUIDED PORT INSERTION  12/02/2021   IRRIGATION AND DEBRIDEMENT ABSCESS Right 09/28/2022   Procedure: IRRIGATION AND DEBRIDEMENT ABSCESS;  Surgeon: Lewie Chamber, DO;  Location: [facility];  Service: General;  Laterality: Right;   POLYPECTOMY  01/25/2020   Procedure: POLYPECTOMY;  Surgeon: Lanelle Bal, DO;  [facility];  Service: Endoscopy;;   POLYPECTOMY  04/07/2020   Procedure: POLYPECTOMY INTESTINAL;  Surgeon: Lanelle Bal, DO;  [facility];  Service: Endoscopy;;   PORT-A-CATH REMOVAL Right 09/28/2022   Procedure: MINOR REMOVAL PORT-A-CATH;  Surgeon: Lewie Chamber, DO;  Location: [facility];  Service: General;  Laterality: Right;   SKIN CANCER EXCISION     Face   SPINE SURGERY     SUBMUCOSAL TATTOO INJECTION  01/25/2020   Procedure: SUBMUCOSAL TATTOO INJECTION;  Surgeon: Lanelle Bal, DO;  [facility];  Service: Endoscopy;;     Allergies  Allergen Reactions   Keflex [Cephalexin] Nausea And Vomiting      Family History  Problem Relation Age of Onset   Diabetes Father    Heart disease Father 31       MI   Hypertension Father    Anemia Mother        Transfusion dependent   COPD Sister    Cancer Paternal Grandmother 23       Pancreatic     Social History Ms. Suits reports that she quit smoking about 7 years ago. Her smoking use included cigarettes. She started smoking about 47 years ago. She has a 60 pack-year smoking history. She has never used smokeless tobacco. Ms. Goldbaum reports no history of alcohol use.   Review of Systems CONSTITUTIONAL: No weight loss, fever, chills, weakness or fatigue.  HEENT: Eyes: No visual loss, blurred vision, double vision or yellow sclerae.No hearing loss, sneezing, congestion, runny nose or sore throat.  SKIN: No rash or itching.  CARDIOVASCULAR:  RESPIRATORY: No shortness of breath, cough or sputum.  GASTROINTESTINAL: No anorexia, nausea, vomiting or diarrhea. No abdominal pain or blood.  GENITOURINARY: No burning on urination, no polyuria NEUROLOGICAL: No headache, dizziness, syncope, paralysis, ataxia, numbness or tingling in the extremities. No change in bowel or bladder control.  MUSCULOSKELETAL: No muscle, back pain, joint pain or stiffness.  LYMPHATICS: No enlarged nodes. No history of splenectomy.  PSYCHIATRIC: No history of depression or anxiety.  ENDOCRINOLOGIC: No reports of sweating, cold or heat intolerance. No polyuria or polydipsia.  Marland Kitchen   Physical Examination There were no vitals filed for this visit. There were no vitals filed for this visit.  Gen: resting comfortably, no acute distress HEENT: no scleral icterus, pupils equal round and reactive, no palptable cervical adenopathy,  CV Resp: Clear to auscultation bilaterally GI: abdomen is soft, non-tender, non-distended, normal bowel  sounds, no hepatosplenomegaly MSK: extremities are warm, no edema.  Skin: warm, no rash Neuro:  no focal deficits Psych: appropriate affect   Diagnostic Studies  05/2022 echo 1.  Left ventricular ejection fraction, by estimation, is >75%. The left  ventricle has hyperdynamic function. The left ventricle has no regional  wall motion abnormalities. There is mild left ventricular hypertrophy.  Left ventricular diastolic parameters  are consistent with Grade I diastolic dysfunction (impaired relaxation).   2. Right ventricular systolic function is normal. The right ventricular  size is normal. Tricuspid regurgitation signal is inadequate for assessing  PA pressure.   3. Left atrial size was moderately dilated.   4. Right atrial size was moderately dilated.   5. The mitral valve is abnormal. No evidence of mitral valve  regurgitation. No evidence of mitral stenosis. Moderate mitral annular  calcification.   6. The aortic valve is calcified. There is severe calcifcation of the  aortic valve. Aortic valve regurgitation is not visualized. Moderate  aortic valve stenosis. Aortic valve area, by VTI measures 1.12 cm. Aortic  valve mean gradient measures 23.0 mmHg.   Aortic valve Vmax measures 3.42 m/s.   7. The inferior vena cava is dilated in size with >50% respiratory  variability, suggesting right atrial pressure of 8 mmHg.   8. Increased flow velocities may be secondary to anemia, thyrotoxicosis,  hyperdynamic or high flow state.     09/2022 echo IMPRESSIONS     1. Left ventricular ejection fraction, by estimation, is 65 to 70%. The  left ventricle has normal function. The left ventricle has no regional  wall motion abnormalities. There is mild left ventricular hypertrophy.  Left ventricular diastolic parameters  are consistent with Grade I diastolic dysfunction (impaired relaxation).  Elevated left atrial pressure.   2. Right ventricular systolic function is normal. The right  ventricular  size is normal. There is mildly elevated pulmonary artery systolic  pressure.   3. Left atrial size was mildly dilated.   4. Right atrial size was mildly dilated.   5. The mitral valve is abnormal. Mild mitral valve regurgitation.   6. The aortic valve has an indeterminant number of cusps. There is  moderate calcification of the aortic valve. There is moderate thickening  of the aortic valve. Aortic valve regurgitation is not visualized. Mild  aortic valve stenosis. Aortic valve mean  gradient measures 18.0 mmHg. Aortic valve peak gradient measures 35.3  mmHg. Aortic valve area, by VTI measures 1.87 cm.   7. Aortic dilatation noted. There is mild dilatation of the ascending  aorta, measuring 40 mm.   8. The inferior vena cava is dilated in size with >50% respiratory  variability, suggesting right atrial pressure of 8 mmHg.    Assessment and Plan   1. Acute on chronic diastolic HF - volume overloaded, certaintly playing some role in her symptoms of SOB, she is awaiting pulmonary evaluation to consider additional causes - increase lasix to 40mg  bid. In 2 weeks check bmet/mg/tsh/bnp - repeat echo given worsening fluid retention and SOB since her last study in 2016   2. Heart murmur - obtain echo   3. HTN - bp elevated today, was at goal with visit last month - follow with diuresis  Antoine Poche, M.D., F.A.C.C.

## 2022-12-08 ENCOUNTER — Inpatient Hospital Stay: Payer: Medicare PPO

## 2022-12-08 VITALS — BP 147/72 | HR 61 | Temp 98.1°F | Resp 20 | Wt 283.7 lb

## 2022-12-08 DIAGNOSIS — E1142 Type 2 diabetes mellitus with diabetic polyneuropathy: Secondary | ICD-10-CM | POA: Diagnosis not present

## 2022-12-08 DIAGNOSIS — D500 Iron deficiency anemia secondary to blood loss (chronic): Secondary | ICD-10-CM

## 2022-12-08 DIAGNOSIS — R161 Splenomegaly, not elsewhere classified: Secondary | ICD-10-CM | POA: Diagnosis not present

## 2022-12-08 DIAGNOSIS — Z5112 Encounter for antineoplastic immunotherapy: Secondary | ICD-10-CM | POA: Diagnosis not present

## 2022-12-08 DIAGNOSIS — D696 Thrombocytopenia, unspecified: Secondary | ICD-10-CM | POA: Diagnosis not present

## 2022-12-08 DIAGNOSIS — K7460 Unspecified cirrhosis of liver: Secondary | ICD-10-CM | POA: Diagnosis not present

## 2022-12-08 DIAGNOSIS — K760 Fatty (change of) liver, not elsewhere classified: Secondary | ICD-10-CM | POA: Diagnosis not present

## 2022-12-08 DIAGNOSIS — C9000 Multiple myeloma not having achieved remission: Secondary | ICD-10-CM | POA: Diagnosis not present

## 2022-12-08 DIAGNOSIS — D72819 Decreased white blood cell count, unspecified: Secondary | ICD-10-CM | POA: Diagnosis not present

## 2022-12-08 DIAGNOSIS — D509 Iron deficiency anemia, unspecified: Secondary | ICD-10-CM | POA: Diagnosis not present

## 2022-12-08 MED ORDER — SODIUM CHLORIDE 0.9 % IV SOLN
500.0000 mg | Freq: Once | INTRAVENOUS | Status: AC
  Administered 2022-12-08: 500 mg via INTRAVENOUS
  Filled 2022-12-08: qty 20

## 2022-12-08 MED ORDER — HEPARIN SOD (PORK) LOCK FLUSH 100 UNIT/ML IV SOLN
250.0000 [IU] | Freq: Once | INTRAVENOUS | Status: AC | PRN
  Administered 2022-12-08: 250 [IU]

## 2022-12-08 MED ORDER — ALTEPLASE 2 MG IJ SOLR: 2.0000 mg | Freq: Once | INTRAMUSCULAR | Status: DC | PRN

## 2022-12-08 MED ORDER — SODIUM CHLORIDE 0.9 % IV SOLN: Freq: Once | INTRAVENOUS | Status: AC

## 2022-12-08 MED ORDER — SODIUM CHLORIDE 0.9% FLUSH
10.0000 mL | INTRAVENOUS | Status: AC
  Administered 2022-12-08: 10 mL

## 2022-12-08 MED ORDER — LORATADINE 10 MG PO TABS: 10.0000 mg | ORAL_TABLET | Freq: Once | ORAL | Status: DC

## 2022-12-08 MED ORDER — CETIRIZINE HCL 10 MG PO TABS: 10.0000 mg | ORAL_TABLET | Freq: Once | ORAL | Status: DC

## 2022-12-08 NOTE — Progress Notes (Signed)
Patient presents today for iron infusion of Venofer and PICC line dressing change.  Patient is in satisfactory condition with no new complaints voiced.  Vital signs are stable.  Patient reports taking pre-med of Zyrtec prior to arrival at 0830am. PICC line flushed well with blood return noted. We will proceed with infusion per provider orders.    PICC line dressing changed without any complications.   Patient tolerated treatment well with no complaints voiced.  Patient left ambulatory in stable condition.  Vital signs stable at discharge.  Follow up as scheduled.

## 2022-12-08 NOTE — Patient Instructions (Signed)
[facility]  Discharge Instructions: Thank you for choosing [facility] to provide your oncology and hematology care.  If you have a lab appointment with the Cancer Center - please note that after April 8th, 2024, all labs will be drawn in the cancer center.  You do not have to check in or register with the main entrance as you have in the past but will complete your check-in in the cancer center.  Wear comfortable clothing and clothing appropriate for easy access to any Portacath or PICC line.   We strive to give you quality time with your provider. You may need to reschedule your appointment if you arrive late (15 or more minutes).  Arriving late affects you and other patients whose appointments are after yours.  Also, if you miss three or more appointments without notifying the office, you may be dismissed from the clinic at the provider's discretion.      For prescription refill requests, have your pharmacy contact our office and allow 72 hours for refills to be completed.    Today you received the following:  Venofer.  Iron Sucrose Injection What is this medication? IRON SUCROSE (EYE ern SOO krose) treats low levels of iron (iron deficiency anemia) in people with kidney disease. Iron is a mineral that plays an important role in making red blood cells, which carry oxygen from your lungs to the rest of your body. This medicine may be used for other purposes; ask your health care provider or pharmacist if you have questions. COMMON BRAND NAME(S): Venofer What should I tell my care team before I take this medication? They need to know if you have any of these conditions: Anemia not caused by low iron levels Heart disease High levels of iron in the blood Kidney disease Liver disease An unusual or allergic reaction to iron, other medications, foods, dyes, or preservatives Pregnant or trying to get pregnant Breastfeeding How should I use this  medication? This medication is for infusion into a vein. It is given in a hospital or clinic setting. Talk to your care team about the use of this medication in children. While this medication may be prescribed for children as young as 2 years for selected conditions, precautions do apply. Overdosage: If you think you have taken too much of this medicine contact a poison control center or emergency room at once. NOTE: This medicine is only for you. Do not share this medicine with others. What if I miss a dose? Keep appointments for follow-up doses. It is important not to miss your dose. Call your care team if you are unable to keep an appointment. What may interact with this medication? Do not take this medication with any of the following: Deferoxamine Dimercaprol Other iron products This medication may also interact with the following: Chloramphenicol Deferasirox This list may not describe all possible interactions. Give your health care provider a list of all the medicines, herbs, non-prescription drugs, or dietary supplements you use. Also tell them if you smoke, drink alcohol, or use illegal drugs. Some items may interact with your medicine. What should I watch for while using this medication? Visit your care team regularly. Tell your care team if your symptoms do not start to get better or if they get worse. You may need blood work done while you are taking this medication. You may need to follow a special diet. Talk to your care team. Foods that contain iron include: whole grains/cereals, dried fruits, beans,  or peas, leafy green vegetables, and organ meats (liver, kidney). What side effects may I notice from receiving this medication? Side effects that you should report to your care team as soon as possible: Allergic reactions--skin rash, itching, hives, swelling of the face, lips, tongue, or throat Low blood pressure--dizziness, feeling faint or lightheaded, blurry vision Shortness of  breath Side effects that usually do not require medical attention (report to your care team if they continue or are bothersome): Flushing Headache Joint pain Muscle pain Nausea Pain, redness, or irritation at injection site This list may not describe all possible side effects. Call your doctor for medical advice about side effects. You may report side effects to FDA at 1-800-FDA-1088. Where should I keep my medication? This medication is given in a hospital or clinic. It will not be stored at home. NOTE: This sheet is a summary. It may not cover all possible information. If you have questions about this medicine, talk to your doctor, pharmacist, or health care provider.  2024 Elsevier/Gold Standard (2022-08-19 00:00:00)     To help prevent nausea and vomiting after your treatment, we encourage you to take your nausea medication as directed.  BELOW ARE SYMPTOMS THAT SHOULD BE REPORTED IMMEDIATELY: *FEVER GREATER THAN 100.4 F (38 C) OR HIGHER *CHILLS OR SWEATING *NAUSEA AND VOMITING THAT IS NOT CONTROLLED WITH YOUR NAUSEA MEDICATION *UNUSUAL SHORTNESS OF BREATH *UNUSUAL BRUISING OR BLEEDING *URINARY PROBLEMS (pain or burning when urinating, or frequent urination) *BOWEL PROBLEMS (unusual diarrhea, constipation, pain near the anus) TENDERNESS IN MOUTH AND THROAT WITH OR WITHOUT PRESENCE OF ULCERS (sore throat, sores in mouth, or a toothache) UNUSUAL RASH, SWELLING OR PAIN  UNUSUAL VAGINAL DISCHARGE OR ITCHING   Items with * indicate a potential emergency and should be followed up as soon as possible or go to the Emergency Department if any problems should occur.  Please show the CHEMOTHERAPY ALERT CARD or IMMUNOTHERAPY ALERT CARD at check-in to the Emergency Department and triage nurse.  Should you have questions after your visit or need to cancel or reschedule your appointment, please contact [facility] [phone]  and follow the prompts.  Office hours are  8:00 a.m. to 4:30 p.m. Monday - Friday. Please note that voicemails left after 4:00 p.m. may not be returned until the following business day.  We are closed weekends and major holidays. You have access to a nurse at all times for urgent questions. Please call the main number to the clinic [phone] and follow the prompts.  For any non-urgent questions, you may also contact your provider using MyChart. We now offer e-Visits for anyone 55 and older to request care online for non-urgent symptoms. For details visit [email or web address].   Also download the MyChart app! Go to the app store, search "MyChart", open the app, select [facility], and log in with your MyChart username and password.

## 2022-12-08 NOTE — Telephone Encounter (Signed)
Attempted to call pt - just keeps ringing

## 2022-12-09 DIAGNOSIS — Z6841 Body Mass Index (BMI) 40.0 and over, adult: Secondary | ICD-10-CM | POA: Diagnosis not present

## 2022-12-09 DIAGNOSIS — Z79899 Other long term (current) drug therapy: Secondary | ICD-10-CM | POA: Diagnosis not present

## 2022-12-09 DIAGNOSIS — F1120 Opioid dependence, uncomplicated: Secondary | ICD-10-CM | POA: Diagnosis not present

## 2022-12-09 DIAGNOSIS — M5136 Other intervertebral disc degeneration, lumbar region: Secondary | ICD-10-CM | POA: Diagnosis not present

## 2022-12-09 DIAGNOSIS — G894 Chronic pain syndrome: Secondary | ICD-10-CM | POA: Diagnosis not present

## 2022-12-09 DIAGNOSIS — E119 Type 2 diabetes mellitus without complications: Secondary | ICD-10-CM | POA: Diagnosis not present

## 2022-12-09 DIAGNOSIS — R030 Elevated blood-pressure reading, without diagnosis of hypertension: Secondary | ICD-10-CM | POA: Diagnosis not present

## 2022-12-09 DIAGNOSIS — C9000 Multiple myeloma not having achieved remission: Secondary | ICD-10-CM | POA: Diagnosis not present

## 2022-12-14 ENCOUNTER — Ambulatory Visit: Payer: Self-pay | Admitting: *Deleted

## 2022-12-14 ENCOUNTER — Other Ambulatory Visit: Payer: Self-pay | Admitting: Hematology

## 2022-12-14 DIAGNOSIS — C9000 Multiple myeloma not having achieved remission: Secondary | ICD-10-CM

## 2022-12-14 NOTE — Patient Outreach (Signed)
  Care Coordination   Follow Up Visit Note   12/14/2022 Name: Savannah Burns MRN: [id number] DOB: Nov 19, 1950  Savannah Burns is a 72 y.o. year old female who sees Raliegh Ip, DO for primary care. I spoke with  [name] by phone today.  What matters to the patients health and wellness today?  Recurrent boils Boils one resolved but another has formed   While on antibiotics her hemaglobin went up and her chemo treatments were stopped. She states she still does not have much energy   Leg wound from her cat and dog  incident    Goals Addressed             This Visit's Progress    management of recurrent boils, infection during cancer treatments-Care Coordination Services   Not on track    Care Coordination Goals: Patient will keep all medical appointments Oncology/hematology on 11/16/22 completed Patient will take medication as prescribed IV antibiotics completed Patient will continue to work with Home Health nurse  Patient will talk with Marval Regal, RN Care Coordinator on 12/14/22 completed Patient will reach out to this RN Care Coordinator at [phone] with any resource or care coordination needs prior to that appt Interventions Today    Flowsheet Row Most Recent Value  Chronic Disease   Chronic disease during today's visit Other  [boil on her back]  General Interventions   General Interventions Discussed/Reviewed General Interventions Reviewed, Sick Day Rules, Communication with, Doctor Visits  Doctor Visits Discussed/Reviewed Doctor Visits Reviewed, PCP, Specialist  PCP/Specialist Visits Compliance with follow-up visit  Communication with PCP/Specialists  [sent a secure message to patient's oncology and pcp coverage about her recurrent boils after cancer treatments and reported new scratches after her pets fought near her Pending response]  Education Interventions   Education Provided Provided Education  [infection, some side effects frm cancer  treatments - Good and bad bacteria Bacteria overgrowth]  Provided Verbal Education On Medication, When to see the doctor, Sick Day Rules, Other, Mental Health/Coping with Illness  Mental Health Interventions   Mental Health Discussed/Reviewed Mental Health Reviewed, Coping Strategies  Pharmacy Interventions   Pharmacy Dicussed/Reviewed Pharmacy Topics Reviewed, Medications and their functions, Affording Medications  Safety Interventions   Safety Discussed/Reviewed Safety Reviewed, Home Safety  Home Safety --  [safety risks around her pets while on cancer treatment Skin care regimen while receiveing cancer treatments - Hib[place]ins]              SDOH assessments and interventions completed:  No     Care Coordination Interventions:  Yes, provided   Follow up plan:  continue to monitor progress as needed    Encounter Outcome:  Patient Visit Completed   Caterina Racine L. Noelle Penner, RN, BSN, CCM, Care Management Coordinator [phone]

## 2022-12-15 ENCOUNTER — Inpatient Hospital Stay: Payer: Medicare PPO

## 2022-12-15 ENCOUNTER — Other Ambulatory Visit: Payer: Self-pay

## 2022-12-15 ENCOUNTER — Other Ambulatory Visit (HOSPITAL_COMMUNITY): Payer: Self-pay | Admitting: Oncology

## 2022-12-15 DIAGNOSIS — C9000 Multiple myeloma not having achieved remission: Secondary | ICD-10-CM

## 2022-12-15 MED ORDER — DOXYCYCLINE HYCLATE 100 MG PO TABS: 100.0000 mg | ORAL_TABLET | Freq: Two times a day (BID) | ORAL | 0 refills | Status: DC

## 2022-12-15 MED ORDER — LENALIDOMIDE 15 MG PO CAPS
ORAL_CAPSULE | ORAL | 0 refills | Status: DC
Start: 2022-12-15 — End: 2023-01-13

## 2022-12-15 NOTE — Telephone Encounter (Signed)
Chart reviewed. Revlimid refilled per last office note with Dr. Katragadda.  

## 2022-12-15 NOTE — Progress Notes (Signed)
Re: Skin infection

## 2022-12-20 ENCOUNTER — Other Ambulatory Visit: Payer: Self-pay

## 2022-12-22 ENCOUNTER — Inpatient Hospital Stay: Payer: Medicare PPO

## 2022-12-22 VITALS — BP 148/64 | HR 84 | Temp 97.7°F | Resp 18

## 2022-12-22 DIAGNOSIS — D500 Iron deficiency anemia secondary to blood loss (chronic): Secondary | ICD-10-CM

## 2022-12-22 DIAGNOSIS — C9000 Multiple myeloma not having achieved remission: Secondary | ICD-10-CM | POA: Diagnosis not present

## 2022-12-22 DIAGNOSIS — D72819 Decreased white blood cell count, unspecified: Secondary | ICD-10-CM | POA: Diagnosis not present

## 2022-12-22 DIAGNOSIS — D509 Iron deficiency anemia, unspecified: Secondary | ICD-10-CM | POA: Diagnosis not present

## 2022-12-22 DIAGNOSIS — R161 Splenomegaly, not elsewhere classified: Secondary | ICD-10-CM | POA: Diagnosis not present

## 2022-12-22 DIAGNOSIS — D696 Thrombocytopenia, unspecified: Secondary | ICD-10-CM | POA: Diagnosis not present

## 2022-12-22 DIAGNOSIS — K760 Fatty (change of) liver, not elsewhere classified: Secondary | ICD-10-CM | POA: Diagnosis not present

## 2022-12-22 DIAGNOSIS — Z5112 Encounter for antineoplastic immunotherapy: Secondary | ICD-10-CM | POA: Diagnosis not present

## 2022-12-22 DIAGNOSIS — E1142 Type 2 diabetes mellitus with diabetic polyneuropathy: Secondary | ICD-10-CM | POA: Diagnosis not present

## 2022-12-22 DIAGNOSIS — K7460 Unspecified cirrhosis of liver: Secondary | ICD-10-CM | POA: Diagnosis not present

## 2022-12-22 MED ORDER — SODIUM CHLORIDE 0.9 % IV SOLN
500.0000 mg | Freq: Once | INTRAVENOUS | Status: AC
  Administered 2022-12-22: 500 mg via INTRAVENOUS
  Filled 2022-12-22: qty 20

## 2022-12-22 MED ORDER — LORATADINE 10 MG PO TABS
10.0000 mg | ORAL_TABLET | Freq: Once | ORAL | Status: DC
  Filled 2022-12-22: qty 1

## 2022-12-22 MED ORDER — HEPARIN SOD (PORK) LOCK FLUSH 100 UNIT/ML IV SOLN
250.0000 [IU] | Freq: Once | INTRAVENOUS | Status: AC | PRN
  Administered 2022-12-22: 250 [IU]

## 2022-12-22 MED ORDER — SODIUM CHLORIDE 0.9 % IV SOLN: Freq: Once | INTRAVENOUS | Status: DC | PRN

## 2022-12-22 MED ORDER — ALBUTEROL SULFATE (2.5 MG/3ML) 0.083% IN NEBU: 2.5000 mg | INHALATION_SOLUTION | Freq: Once | RESPIRATORY_TRACT | Status: DC | PRN

## 2022-12-22 MED ORDER — METHYLPREDNISOLONE SODIUM SUCC 125 MG IJ SOLR: 125.0000 mg | Freq: Once | INTRAMUSCULAR | Status: DC | PRN

## 2022-12-22 MED ORDER — SODIUM CHLORIDE 0.9% FLUSH
10.0000 mL | INTRAVENOUS | Status: DC | PRN
  Administered 2022-12-22: 10 mL via INTRAVENOUS

## 2022-12-22 MED ORDER — SODIUM CHLORIDE 0.9 % IV SOLN: Freq: Once | INTRAVENOUS | Status: AC

## 2022-12-22 MED ORDER — FAMOTIDINE IN NACL 20-0.9 MG/50ML-% IV SOLN: 20.0000 mg | Freq: Once | INTRAVENOUS | Status: DC | PRN

## 2022-12-22 MED ORDER — CYANOCOBALAMIN 1000 MCG/ML IJ SOLN
1000.0000 ug | Freq: Once | INTRAMUSCULAR | Status: AC
  Administered 2022-12-22: 1000 ug via INTRAMUSCULAR
  Filled 2022-12-22: qty 1

## 2022-12-22 MED ORDER — DIPHENHYDRAMINE HCL 50 MG/ML IJ SOLN: 50.0000 mg | Freq: Once | INTRAMUSCULAR | Status: DC | PRN

## 2022-12-22 MED ORDER — HEPARIN SOD (PORK) LOCK FLUSH 100 UNIT/ML IV SOLN: 500.0000 [IU] | Freq: Once | INTRAVENOUS | Status: DC

## 2022-12-22 MED ORDER — EPINEPHRINE 0.3 MG/0.3ML IJ SOAJ: 0.3000 mg | Freq: Once | INTRAMUSCULAR | Status: DC | PRN

## 2022-12-22 NOTE — Progress Notes (Signed)
Patient presents today for iron infusion, PICC line dressing change, and B12 injection. Patient is in satisfactory condition with no new complaints voiced.  Vital signs are stable.  We will proceed with infusion per provider orders.    Patient took Claritin at home prior to arrival. PICC line dressing changing with no complaints voiced.  PICC line flushed easily with good blood return noted with 10 mL of normal saline and 2.5 mL of heparin.  Venofer 500 mg and B12 injection given today per MD orders. Tolerated infusion without adverse affects. Vital signs stable. No complaints at this time. Discharged from clinic via wheelchairin stable condition. Alert and oriented x 3. [facility]

## 2022-12-22 NOTE — Patient Instructions (Signed)
[facility] [place]  Discharge Instructions: Thank you for choosing [facility] to provide your oncology and hematology care.  If you have a lab appointment with the Cancer Center - please note that after April 8th, 2024, all labs will be drawn in the cancer center.  You do not have to check in or register with the main entrance as you have in the past but will complete your check-in in the cancer center.  Wear comfortable clothing and clothing appropriate for easy access to any Portacath or PICC line.   We strive to give you quality time with your provider. You may need to reschedule your appointment if you arrive late (15 or more minutes).  Arriving late affects you and other patients whose appointments are after yours.  Also, if you miss three or more appointments without notifying the office, you may be dismissed from the clinic at the provider's discretion.      For prescription refill requests, have your pharmacy contact our office and allow 72 hours for refills to be completed.    Today you received Venofer IV iron infusion.   BELOW ARE SYMPTOMS THAT SHOULD BE REPORTED IMMEDIATELY: *FEVER GREATER THAN 100.4 F (38 C) OR HIGHER *CHILLS OR SWEATING *NAUSEA AND VOMITING THAT IS NOT CONTROLLED WITH YOUR NAUSEA MEDICATION *UNUSUAL SHORTNESS OF BREATH *UNUSUAL BRUISING OR BLEEDING *URINARY PROBLEMS (pain or burning when urinating, or frequent urination) *BOWEL PROBLEMS (unusual diarrhea, constipation, pain near the anus) TENDERNESS IN MOUTH AND THROAT WITH OR WITHOUT PRESENCE OF ULCERS (sore throat, sores in mouth, or a toothache) UNUSUAL RASH, SWELLING OR PAIN  UNUSUAL VAGINAL DISCHARGE OR ITCHING   Items with * indicate a potential emergency and should be followed up as soon as possible or go to the Emergency Department if any problems should occur.  Please show the CHEMOTHERAPY ALERT CARD or IMMUNOTHERAPY ALERT CARD at check-in to the Emergency Department  and triage nurse.  Should you have questions after your visit or need to cancel or reschedule your appointment, please contact [facility] [phone]  and follow the prompts.  Office hours are 8:00 a.m. to 4:30 p.m. Monday - Friday. Please note that voicemails left after 4:00 p.m. may not be returned until the following business day.  We are closed weekends and major holidays. You have access to a nurse at all times for urgent questions. Please call the main number to the clinic [phone] and follow the prompts.  For any non-urgent questions, you may also contact your provider using MyChart. We now offer e-Visits for anyone 63 and older to request care online for non-urgent symptoms. For details visit [email or web address].   Also download the MyChart app! Go to the app store, search "MyChart", open the app, select [place], and log in with your MyChart username and password.

## 2022-12-25 DIAGNOSIS — J449 Chronic obstructive pulmonary disease, unspecified: Secondary | ICD-10-CM | POA: Diagnosis not present

## 2022-12-27 ENCOUNTER — Encounter: Payer: Self-pay | Admitting: Hematology

## 2022-12-28 ENCOUNTER — Inpatient Hospital Stay: Payer: Medicare PPO | Admitting: Hematology

## 2022-12-28 ENCOUNTER — Inpatient Hospital Stay: Payer: Medicare PPO

## 2022-12-28 ENCOUNTER — Inpatient Hospital Stay: Payer: Medicare PPO | Attending: Hematology

## 2022-12-28 DIAGNOSIS — C9000 Multiple myeloma not having achieved remission: Secondary | ICD-10-CM | POA: Insufficient documentation

## 2022-12-28 DIAGNOSIS — Z5112 Encounter for antineoplastic immunotherapy: Secondary | ICD-10-CM | POA: Insufficient documentation

## 2023-01-05 ENCOUNTER — Other Ambulatory Visit: Payer: Self-pay

## 2023-01-05 ENCOUNTER — Inpatient Hospital Stay: Payer: Medicare PPO

## 2023-01-07 DIAGNOSIS — R5383 Other fatigue: Secondary | ICD-10-CM | POA: Diagnosis not present

## 2023-01-07 DIAGNOSIS — Z131 Encounter for screening for diabetes mellitus: Secondary | ICD-10-CM | POA: Diagnosis not present

## 2023-01-07 DIAGNOSIS — I10 Essential (primary) hypertension: Secondary | ICD-10-CM | POA: Diagnosis not present

## 2023-01-07 DIAGNOSIS — E119 Type 2 diabetes mellitus without complications: Secondary | ICD-10-CM | POA: Diagnosis not present

## 2023-01-07 DIAGNOSIS — Z6841 Body Mass Index (BMI) 40.0 and over, adult: Secondary | ICD-10-CM | POA: Diagnosis not present

## 2023-01-07 DIAGNOSIS — E7800 Pure hypercholesterolemia, unspecified: Secondary | ICD-10-CM | POA: Diagnosis not present

## 2023-01-07 DIAGNOSIS — E559 Vitamin D deficiency, unspecified: Secondary | ICD-10-CM | POA: Diagnosis not present

## 2023-01-07 DIAGNOSIS — Z0000 Encounter for general adult medical examination without abnormal findings: Secondary | ICD-10-CM | POA: Diagnosis not present

## 2023-01-07 DIAGNOSIS — M51369 Other intervertebral disc degeneration, lumbar region without mention of lumbar back pain or lower extremity pain: Secondary | ICD-10-CM | POA: Diagnosis not present

## 2023-01-07 DIAGNOSIS — F1120 Opioid dependence, uncomplicated: Secondary | ICD-10-CM | POA: Diagnosis not present

## 2023-01-09 ENCOUNTER — Other Ambulatory Visit: Payer: Self-pay | Admitting: Hematology

## 2023-01-09 DIAGNOSIS — C9000 Multiple myeloma not having achieved remission: Secondary | ICD-10-CM

## 2023-01-12 ENCOUNTER — Inpatient Hospital Stay: Payer: Medicare PPO

## 2023-01-12 VITALS — BP 148/60 | HR 62 | Temp 97.5°F | Resp 20

## 2023-01-12 DIAGNOSIS — C9000 Multiple myeloma not having achieved remission: Secondary | ICD-10-CM

## 2023-01-12 DIAGNOSIS — Z5112 Encounter for antineoplastic immunotherapy: Secondary | ICD-10-CM | POA: Diagnosis not present

## 2023-01-12 DIAGNOSIS — D500 Iron deficiency anemia secondary to blood loss (chronic): Secondary | ICD-10-CM

## 2023-01-12 DIAGNOSIS — Z95828 Presence of other vascular implants and grafts: Secondary | ICD-10-CM

## 2023-01-12 LAB — CBC WITH DIFFERENTIAL/PLATELET
Abs Immature Granulocytes: 0.02 10*3/uL (ref 0.00–0.07)
Basophils Absolute: 0 10*3/uL (ref 0.0–0.1)
Basophils Relative: 0 %
Eosinophils Absolute: 0.1 10*3/uL (ref 0.0–0.5)
Eosinophils Relative: 1 %
HCT: 39 % (ref 36.0–46.0)
Hemoglobin: 12.5 g/dL (ref 12.0–15.0)
Immature Granulocytes: 1 %
Lymphocytes Relative: 31 %
Lymphs Abs: 1.3 10*3/uL (ref 0.7–4.0)
MCH: 32.6 pg (ref 26.0–34.0)
MCHC: 32.1 g/dL (ref 30.0–36.0)
MCV: 101.8 fL — ABNORMAL HIGH (ref 80.0–100.0)
Monocytes Absolute: 0.4 10*3/uL (ref 0.1–1.0)
Monocytes Relative: 11 %
Neutro Abs: 2.4 10*3/uL (ref 1.7–7.7)
Neutrophils Relative %: 56 %
Platelets: 93 10*3/uL — ABNORMAL LOW (ref 150–400)
RBC: 3.83 MIL/uL — ABNORMAL LOW (ref 3.87–5.11)
RDW: 15.2 % (ref 11.5–15.5)
WBC: 4.2 10*3/uL (ref 4.0–10.5)
nRBC: 0 % (ref 0.0–0.2)

## 2023-01-12 LAB — COMPREHENSIVE METABOLIC PANEL
ALT: 14 U/L (ref 0–44)
AST: 14 U/L — ABNORMAL LOW (ref 15–41)
Albumin: 3.5 g/dL (ref 3.5–5.0)
Alkaline Phosphatase: 110 U/L (ref 38–126)
Anion gap: 10 (ref 5–15)
BUN: 11 mg/dL (ref 8–23)
CO2: 30 mmol/L (ref 22–32)
Calcium: 8.5 mg/dL — ABNORMAL LOW (ref 8.9–10.3)
Chloride: 98 mmol/L (ref 98–111)
Creatinine, Ser: 0.55 mg/dL (ref 0.44–1.00)
GFR, Estimated: >60 mL/min (ref >60)
Glucose, Bld: 123 mg/dL — ABNORMAL HIGH (ref 70–99)
Potassium: 3.7 mmol/L (ref 3.5–5.1)
Sodium: 138 mmol/L (ref 135–145)
Total Bilirubin: 0.4 mg/dL (ref 0.3–1.2)
Total Protein: 6.2 g/dL — ABNORMAL LOW (ref 6.5–8.1)

## 2023-01-12 LAB — IRON AND TIBC
Iron: 90 ug/dL (ref 28–170)
Saturation Ratios: 28 % (ref 10.4–31.8)
TIBC: 323 ug/dL (ref 250–450)
UIBC: 233 ug/dL

## 2023-01-12 LAB — FERRITIN: Ferritin: 441 ng/mL — ABNORMAL HIGH (ref 11–307)

## 2023-01-12 LAB — MAGNESIUM: Magnesium: 1.9 mg/dL (ref 1.7–2.4)

## 2023-01-12 MED ORDER — DIPHENHYDRAMINE HCL 25 MG PO CAPS
50.0000 mg | ORAL_CAPSULE | Freq: Once | ORAL | Status: AC
  Administered 2023-01-12: 50 mg via ORAL
  Filled 2023-01-12: qty 2

## 2023-01-12 MED ORDER — SODIUM CHLORIDE 0.9 % IV SOLN
500.0000 mg | Freq: Once | INTRAVENOUS | Status: AC
  Administered 2023-01-12: 500 mg via INTRAVENOUS
  Filled 2023-01-12: qty 20

## 2023-01-12 MED ORDER — SODIUM CHLORIDE 0.9 % IV SOLN: Freq: Once | INTRAVENOUS | Status: AC

## 2023-01-12 MED ORDER — ACETAMINOPHEN 325 MG PO TABS
650.0000 mg | ORAL_TABLET | Freq: Once | ORAL | Status: AC
  Administered 2023-01-12: 650 mg via ORAL
  Filled 2023-01-12: qty 2

## 2023-01-12 MED ORDER — HEPARIN SOD (PORK) LOCK FLUSH 100 UNIT/ML IV SOLN
500.0000 [IU] | Freq: Once | INTRAVENOUS | Status: AC | PRN
  Administered 2023-01-12: 250 [IU]

## 2023-01-12 MED ORDER — SODIUM CHLORIDE 0.9% FLUSH: 10.0000 mL | Freq: Once | INTRAVENOUS | Status: DC | PRN

## 2023-01-12 MED ORDER — DARATUMUMAB-HYALURONIDASE-FIHJ 1800-30000 MG-UT/15ML ~~LOC~~ SOLN
1800.0000 mg | Freq: Once | SUBCUTANEOUS | Status: AC
  Administered 2023-01-12: 1800 mg via SUBCUTANEOUS
  Filled 2023-01-12: qty 15

## 2023-01-12 NOTE — Progress Notes (Signed)
1030 PICC line dressing change done with 2 RNs per protocol. Enforced education on importance of coming every week to have PICC line dressing changed and flushed. Educated importance of only the staff here at the clinic or a home health nurse to be the only personal to touch per PICC line as it needs to be a sterile process.

## 2023-01-12 NOTE — Progress Notes (Signed)
Patient presents today for Daratumumab injection and Venofer 500 mg infusion.   Patient is in satisfactory condition with no new complaints voiced.  Vital signs are stable.  Labs reviewed and all labs are within treatment parameters.  We will proceed with treatment per MD orders.    Patient tolerated injection and infusion well with no complaints voiced.  Patient left via wheelchair with granddaughter in stable condition.  Vital signs stable at discharge.  Follow up as scheduled.

## 2023-01-12 NOTE — Patient Instructions (Signed)
[facility] [place]  Discharge Instructions: Thank you for choosing [facility] to provide your oncology and hematology care.  If you have a lab appointment with the Cancer Center - please note that after April 8th, 2024, all labs will be drawn in the cancer center.  You do not have to check in or register with the main entrance as you have in the past but will complete your check-in in the cancer center.  Wear comfortable clothing and clothing appropriate for easy access to any Portacath or PICC line.   We strive to give you quality time with your provider. You may need to reschedule your appointment if you arrive late (15 or more minutes).  Arriving late affects you and other patients whose appointments are after yours.  Also, if you miss three or more appointments without notifying the office, you may be dismissed from the clinic at the provider's discretion.      For prescription refill requests, have your pharmacy contact our office and allow 72 hours for refills to be completed.    Today you received the following chemotherapy and/or immunotherapy agents Daratumumab/Venofer.  Daratumumab; Hyaluronidase Injection What is this medication? DARATUMUMAB; HYALURONIDASE (dar a toom ue mab; hye al ur ON i dase) treats multiple myeloma, a type of bone marrow cancer. Daratumumab works by blocking a protein that causes cancer cells to grow and multiply. This helps to slow or stop the spread of cancer cells. Hyaluronidase works by increasing the absorption of other medications in the body to help them work better. This medication may also be used treat amyloidosis, a condition that causes the buildup of a protein (amyloid) in your body. It works by reducing the buildup of this protein, which decreases symptoms. It is a combination medication that contains a monoclonal antibody. This medicine may be used for other purposes; ask your health care provider or pharmacist if you have  questions. COMMON BRAND NAME(S): DARZALEX FASPRO What should I tell my care team before I take this medication? They need to know if you have any of these conditions: Heart disease Infection, such as chickenpox, cold sores, herpes, hepatitis B Lung or breathing disease An unusual or allergic reaction to daratumumab, hyaluronidase, other medications, foods, dyes, or preservatives Pregnant or trying to get pregnant Breast-feeding How should I use this medication? This medication is injected under the skin. It is given by your care team in a hospital or clinic setting. Talk to your care team about the use of this medication in children. Special care may be needed. Overdosage: If you think you have taken too much of this medicine contact a poison control center or emergency room at once. NOTE: This medicine is only for you. Do not share this medicine with others. What if I miss a dose? Keep appointments for follow-up doses. It is important not to miss your dose. Call your care team if you are unable to keep an appointment. What may interact with this medication? Interactions have not been studied. This list may not describe all possible interactions. Give your health care provider a list of all the medicines, herbs, non-prescription drugs, or dietary supplements you use. Also tell them if you smoke, drink alcohol, or use illegal drugs. Some items may interact with your medicine. What should I watch for while using this medication? Your condition will be monitored carefully while you are receiving this medication. This medication can cause serious allergic reactions. To reduce your risk, your care team may  give you other medication to take before receiving this one. Be sure to follow the directions from your care team. This medication can affect the results of blood tests to match your blood type. These changes can last for up to 6 months after the final dose. Your care team will do blood tests to  match your blood type before you start treatment. Tell all of your care team that you are being treated with this medication before receiving a blood transfusion. This medication can affect the results of some tests used to determine treatment response; extra tests may be needed to evaluate response. Talk to your care team if you wish to become pregnant or think you are pregnant. This medication can cause serious birth defects if taken during pregnancy and for 3 months after the last dose. A reliable form of contraception is recommended while taking this medication and for 3 months after the last dose. Talk to your care team about effective forms of contraception. Do not breast-feed while taking this medication. What side effects may I notice from receiving this medication? Side effects that you should report to your care team as soon as possible: Allergic reactions--skin rash, itching, hives, swelling of the face, lips, tongue, or throat Heart rhythm changes--fast or irregular heartbeat, dizziness, feeling faint or lightheaded, chest pain, trouble breathing Infection--fever, chills, cough, sore throat, wounds that don't heal, pain or trouble when passing urine, general feeling of discomfort or being unwell Infusion reactions--chest pain, shortness of breath or trouble breathing, feeling faint or lightheaded Sudden eye pain or change in vision such as blurry vision, seeing halos around lights, vision loss Unusual bruising or bleeding Side effects that usually do not require medical attention (report to your care team if they continue or are bothersome): Constipation Diarrhea Fatigue Nausea Pain, tingling, or numbness in the hands or feet Swelling of the ankles, hands, or feet This list may not describe all possible side effects. Call your doctor for medical advice about side effects. You may report side effects to FDA at 1-800-FDA-1088. Where should I keep my medication? This medication is given  in a hospital or clinic. It will not be stored at home. NOTE: This sheet is a summary. It may not cover all possible information. If you have questions about this medicine, talk to your doctor, pharmacist, or health care provider.  2024 Elsevier/Gold Standard (2021-07-20 00:00:00)    Iron Sucrose Injection What is this medication? IRON SUCROSE (EYE ern SOO krose) treats low levels of iron (iron deficiency anemia) in people with kidney disease. Iron is a mineral that plays an important role in making red blood cells, which carry oxygen from your lungs to the rest of your body. This medicine may be used for other purposes; ask your health care provider or pharmacist if you have questions. COMMON BRAND NAME(S): Venofer What should I tell my care team before I take this medication? They need to know if you have any of these conditions: Anemia not caused by low iron levels Heart disease High levels of iron in the blood Kidney disease Liver disease An unusual or allergic reaction to iron, other medications, foods, dyes, or preservatives Pregnant or trying to get pregnant Breastfeeding How should I use this medication? This medication is for infusion into a vein. It is given in a hospital or clinic setting. Talk to your care team about the use of this medication in children. While this medication may be prescribed for children as young as 2 years for  selected conditions, precautions do apply. Overdosage: If you think you have taken too much of this medicine contact a poison control center or emergency room at once. NOTE: This medicine is only for you. Do not share this medicine with others. What if I miss a dose? Keep appointments for follow-up doses. It is important not to miss your dose. Call your care team if you are unable to keep an appointment. What may interact with this medication? Do not take this medication with any of the following: Deferoxamine Dimercaprol Other iron  products This medication may also interact with the following: Chloramphenicol Deferasirox This list may not describe all possible interactions. Give your health care provider a list of all the medicines, herbs, non-prescription drugs, or dietary supplements you use. Also tell them if you smoke, drink alcohol, or use illegal drugs. Some items may interact with your medicine. What should I watch for while using this medication? Visit your care team regularly. Tell your care team if your symptoms do not start to get better or if they get worse. You may need blood work done while you are taking this medication. You may need to follow a special diet. Talk to your care team. Foods that contain iron include: whole grains/cereals, dried fruits, beans, or peas, leafy green vegetables, and organ meats (liver, kidney). What side effects may I notice from receiving this medication? Side effects that you should report to your care team as soon as possible: Allergic reactions--skin rash, itching, hives, swelling of the face, lips, tongue, or throat Low blood pressure--dizziness, feeling faint or lightheaded, blurry vision Shortness of breath Side effects that usually do not require medical attention (report to your care team if they continue or are bothersome): Flushing Headache Joint pain Muscle pain Nausea Pain, redness, or irritation at injection site This list may not describe all possible side effects. Call your doctor for medical advice about side effects. You may report side effects to FDA at 1-800-FDA-1088. Where should I keep my medication? This medication is given in a hospital or clinic. It will not be stored at home. NOTE: This sheet is a summary. It may not cover all possible information. If you have questions about this medicine, talk to your doctor, pharmacist, or health care provider.  2024 Elsevier/Gold Standard (2022-08-19 00:00:00)        To help prevent nausea and vomiting after  your treatment, we encourage you to take your nausea medication as directed.  BELOW ARE SYMPTOMS THAT SHOULD BE REPORTED IMMEDIATELY: *FEVER GREATER THAN 100.4 F (38 C) OR HIGHER *CHILLS OR SWEATING *NAUSEA AND VOMITING THAT IS NOT CONTROLLED WITH YOUR NAUSEA MEDICATION *UNUSUAL SHORTNESS OF BREATH *UNUSUAL BRUISING OR BLEEDING *URINARY PROBLEMS (pain or burning when urinating, or frequent urination) *BOWEL PROBLEMS (unusual diarrhea, constipation, pain near the anus) TENDERNESS IN MOUTH AND THROAT WITH OR WITHOUT PRESENCE OF ULCERS (sore throat, sores in mouth, or a toothache) UNUSUAL RASH, SWELLING OR PAIN  UNUSUAL VAGINAL DISCHARGE OR ITCHING   Items with * indicate a potential emergency and should be followed up as soon as possible or go to the Emergency Department if any problems should occur.  Please show the CHEMOTHERAPY ALERT CARD or IMMUNOTHERAPY ALERT CARD at check-in to the Emergency Department and triage nurse.  Should you have questions after your visit or need to cancel or reschedule your appointment, please contact [facility] CENTER AT [facility] [phone]  and follow the prompts.  Office hours are 8:00 a.m. to 4:30 p.m. Monday - Friday.  Please note that voicemails left after 4:00 p.m. may not be returned until the following business day.  We are closed weekends and major holidays. You have access to a nurse at all times for urgent questions. Please call the main number to the clinic [phone] and follow the prompts.  For any non-urgent questions, you may also contact your provider using MyChart. We now offer e-Visits for anyone 24 and older to request care online for non-urgent symptoms. For details visit [email or web address].   Also download the MyChart app! Go to the app store, search "MyChart", open the app, select [place], and log in with your MyChart username and password.

## 2023-01-13 ENCOUNTER — Other Ambulatory Visit: Payer: Self-pay

## 2023-01-13 DIAGNOSIS — C9000 Multiple myeloma not having achieved remission: Secondary | ICD-10-CM

## 2023-01-13 LAB — KAPPA/LAMBDA LIGHT CHAINS
Kappa free light chain: 15.9 mg/L (ref 3.3–19.4)
Kappa, lambda light chain ratio: 3.24 — ABNORMAL HIGH (ref 0.26–1.65)
Lambda free light chains: 4.9 mg/L — ABNORMAL LOW (ref 5.7–26.3)

## 2023-01-13 MED ORDER — LENALIDOMIDE 15 MG PO CAPS: ORAL_CAPSULE | ORAL | 0 refills | Status: DC

## 2023-01-13 NOTE — Telephone Encounter (Signed)
Chart reviewed. Revlimid refilled per last office note with Dr. Katragadda.  

## 2023-01-15 LAB — PROTEIN ELECTROPHORESIS, SERUM
A/G Ratio: 1.4 (ref 0.7–1.7)
Albumin ELP: 3.4 g/dL (ref 2.9–4.4)
Alpha-1-Globulin: 0.2 g/dL (ref 0.0–0.4)
Alpha-2-Globulin: 0.7 g/dL (ref 0.4–1.0)
Beta Globulin: 0.8 g/dL (ref 0.7–1.3)
Gamma Globulin: 0.6 g/dL (ref 0.4–1.8)
Globulin, Total: 2.4 g/dL (ref 2.2–3.9)
M-Spike, %: 0.3 g/dL — ABNORMAL HIGH
Total Protein ELP: 5.8 g/dL — ABNORMAL LOW (ref 6.0–8.5)

## 2023-01-19 ENCOUNTER — Inpatient Hospital Stay: Payer: Medicare PPO

## 2023-01-19 DIAGNOSIS — Z95828 Presence of other vascular implants and grafts: Secondary | ICD-10-CM

## 2023-01-19 DIAGNOSIS — Z5112 Encounter for antineoplastic immunotherapy: Secondary | ICD-10-CM | POA: Diagnosis not present

## 2023-01-19 DIAGNOSIS — C9000 Multiple myeloma not having achieved remission: Secondary | ICD-10-CM

## 2023-01-19 LAB — CBC WITH DIFFERENTIAL/PLATELET
Abs Immature Granulocytes: 0.01 10*3/uL (ref 0.00–0.07)
Basophils Absolute: 0 10*3/uL (ref 0.0–0.1)
Basophils Relative: 0 %
Eosinophils Absolute: 0.1 10*3/uL (ref 0.0–0.5)
Eosinophils Relative: 2 %
HCT: 38.6 % (ref 36.0–46.0)
Hemoglobin: 12.5 g/dL (ref 12.0–15.0)
Immature Granulocytes: 0 %
Lymphocytes Relative: 26 %
Lymphs Abs: 1.3 10*3/uL (ref 0.7–4.0)
MCH: 33.3 pg (ref 26.0–34.0)
MCHC: 32.4 g/dL (ref 30.0–36.0)
MCV: 102.9 fL — ABNORMAL HIGH (ref 80.0–100.0)
Monocytes Absolute: 0.6 10*3/uL (ref 0.1–1.0)
Monocytes Relative: 13 %
Neutro Abs: 2.9 10*3/uL (ref 1.7–7.7)
Neutrophils Relative %: 59 %
Platelets: 86 10*3/uL — ABNORMAL LOW (ref 150–400)
RBC: 3.75 MIL/uL — ABNORMAL LOW (ref 3.87–5.11)
RDW: 15.1 % (ref 11.5–15.5)
WBC: 4.9 10*3/uL (ref 4.0–10.5)
nRBC: 0 % (ref 0.0–0.2)

## 2023-01-19 LAB — SAMPLE TO BLOOD BANK

## 2023-01-19 MED ORDER — SODIUM CHLORIDE 0.9% FLUSH
10.0000 mL | Freq: Once | INTRAVENOUS | Status: AC
  Administered 2023-01-19: 10 mL via INTRAVENOUS

## 2023-01-19 MED ORDER — HEPARIN SOD (PORK) LOCK FLUSH 100 UNIT/ML IV SOLN
500.0000 [IU] | Freq: Once | INTRAVENOUS | Status: AC
  Administered 2023-01-19: 500 [IU] via INTRAVENOUS

## 2023-01-19 NOTE — Patient Instructions (Signed)

## 2023-01-19 NOTE — Progress Notes (Signed)
Labs drawn from PICC.  PICC line flushed easily with good blood return noted.  See MAR for details. No complaints of pain with flush.  Insertion site clean and dry with no bruising, swelling, or drainage noted.  No redness noted at site.  Dressing changed per policy.  No s/s of distress noted.  VSs with discharge and left in satisfactory condition.

## 2023-01-24 DIAGNOSIS — J449 Chronic obstructive pulmonary disease, unspecified: Secondary | ICD-10-CM | POA: Diagnosis not present

## 2023-01-25 ENCOUNTER — Inpatient Hospital Stay: Payer: Medicare PPO

## 2023-01-25 ENCOUNTER — Inpatient Hospital Stay: Payer: Medicare PPO | Admitting: Hematology

## 2023-01-26 ENCOUNTER — Inpatient Hospital Stay: Payer: Medicare PPO

## 2023-01-26 VITALS — BP 137/99 | HR 76 | Temp 98.6°F | Resp 20

## 2023-01-26 VITALS — BP 158/84 | HR 76 | Resp 20

## 2023-01-26 DIAGNOSIS — C9000 Multiple myeloma not having achieved remission: Secondary | ICD-10-CM | POA: Diagnosis not present

## 2023-01-26 DIAGNOSIS — Z95828 Presence of other vascular implants and grafts: Secondary | ICD-10-CM

## 2023-01-26 DIAGNOSIS — Z5112 Encounter for antineoplastic immunotherapy: Secondary | ICD-10-CM | POA: Diagnosis not present

## 2023-01-26 DIAGNOSIS — D500 Iron deficiency anemia secondary to blood loss (chronic): Secondary | ICD-10-CM

## 2023-01-26 LAB — COMPREHENSIVE METABOLIC PANEL
ALT: 16 U/L (ref 0–44)
AST: 18 U/L (ref 15–41)
Albumin: 3.5 g/dL (ref 3.5–5.0)
Alkaline Phosphatase: 104 U/L (ref 38–126)
Anion gap: 11 (ref 5–15)
BUN: 13 mg/dL (ref 8–23)
CO2: 29 mmol/L (ref 22–32)
Calcium: 8.4 mg/dL — ABNORMAL LOW (ref 8.9–10.3)
Chloride: 98 mmol/L (ref 98–111)
Creatinine, Ser: 0.67 mg/dL (ref 0.44–1.00)
GFR, Estimated: >60 mL/min (ref >60)
Glucose, Bld: 181 mg/dL — ABNORMAL HIGH (ref 70–99)
Potassium: 3.5 mmol/L (ref 3.5–5.1)
Sodium: 138 mmol/L (ref 135–145)
Total Bilirubin: 0.6 mg/dL (ref 0.3–1.2)
Total Protein: 6.1 g/dL — ABNORMAL LOW (ref 6.5–8.1)

## 2023-01-26 LAB — CBC WITH DIFFERENTIAL/PLATELET
Abs Immature Granulocytes: 0.01 10*3/uL (ref 0.00–0.07)
Basophils Absolute: 0 10*3/uL (ref 0.0–0.1)
Basophils Relative: 0 %
Eosinophils Absolute: 0.1 10*3/uL (ref 0.0–0.5)
Eosinophils Relative: 2 %
HCT: 39.8 % (ref 36.0–46.0)
Hemoglobin: 13.1 g/dL (ref 12.0–15.0)
Immature Granulocytes: 0 %
Lymphocytes Relative: 37 %
Lymphs Abs: 1.2 10*3/uL (ref 0.7–4.0)
MCH: 33.5 pg (ref 26.0–34.0)
MCHC: 32.9 g/dL (ref 30.0–36.0)
MCV: 101.8 fL — ABNORMAL HIGH (ref 80.0–100.0)
Monocytes Absolute: 0.5 10*3/uL (ref 0.1–1.0)
Monocytes Relative: 14 %
Neutro Abs: 1.6 10*3/uL — ABNORMAL LOW (ref 1.7–7.7)
Neutrophils Relative %: 47 %
Platelets: 69 10*3/uL — ABNORMAL LOW (ref 150–400)
RBC: 3.91 MIL/uL (ref 3.87–5.11)
RDW: 14.7 % (ref 11.5–15.5)
WBC: 3.4 10*3/uL — ABNORMAL LOW (ref 4.0–10.5)
nRBC: 0 % (ref 0.0–0.2)

## 2023-01-26 LAB — SAMPLE TO BLOOD BANK

## 2023-01-26 LAB — MAGNESIUM: Magnesium: 1.8 mg/dL (ref 1.7–2.4)

## 2023-01-26 MED ORDER — HEPARIN SOD (PORK) LOCK FLUSH 100 UNIT/ML IV SOLN
250.0000 [IU] | Freq: Once | INTRAVENOUS | Status: AC
  Administered 2023-01-26: 250 [IU] via INTRAVENOUS

## 2023-01-26 MED ORDER — CETIRIZINE HCL 10 MG PO TABS: 10.0000 mg | ORAL_TABLET | Freq: Once | ORAL | Status: DC

## 2023-01-26 MED ORDER — LORATADINE 10 MG PO TABS
10.0000 mg | ORAL_TABLET | Freq: Once | ORAL | Status: DC
Start: 2023-01-26 — End: 2023-01-26
  Filled 2023-01-26: qty 1

## 2023-01-26 MED ORDER — SODIUM CHLORIDE 0.9% FLUSH
10.0000 mL | INTRAVENOUS | Status: DC | PRN
  Administered 2023-01-26: 10 mL via INTRAVENOUS

## 2023-01-26 MED ORDER — SODIUM CHLORIDE 0.9 % IV SOLN: Freq: Once | INTRAVENOUS | Status: AC

## 2023-01-26 MED ORDER — SODIUM CHLORIDE 0.9 % IV SOLN
500.0000 mg | Freq: Once | INTRAVENOUS | Status: AC
  Administered 2023-01-26: 500 mg via INTRAVENOUS
  Filled 2023-01-26: qty 20

## 2023-01-26 NOTE — Patient Instructions (Signed)
Iron Sucrose Injection What is this medication? IRON SUCROSE (EYE ern SOO krose) treats low levels of iron (iron deficiency anemia) in people with kidney disease. Iron is a mineral that plays an important role in making red blood cells, which carry oxygen from your lungs to the rest of your body. This medicine may be used for other purposes; ask your health care provider or pharmacist if you have questions. COMMON BRAND NAME(S): Venofer What should I tell my care team before I take this medication? They need to know if you have any of these conditions: Anemia not caused by low iron levels Heart disease High levels of iron in the blood Kidney disease Liver disease An unusual or allergic reaction to iron, other medications, foods, dyes, or preservatives Pregnant or trying to get pregnant Breastfeeding How should I use this medication? This medication is for infusion into a vein. It is given in a hospital or clinic setting. Talk to your care team about the use of this medication in children. While this medication may be prescribed for children as young as 2 years for selected conditions, precautions do apply. Overdosage: If you think you have taken too much of this medicine contact a poison control center or emergency room at once. NOTE: This medicine is only for you. Do not share this medicine with others. What if I miss a dose? Keep appointments for follow-up doses. It is important not to miss your dose. Call your care team if you are unable to keep an appointment. What may interact with this medication? Do not take this medication with any of the following: Deferoxamine Dimercaprol Other iron products This medication may also interact with the following: Chloramphenicol Deferasirox This list may not describe all possible interactions. Give your health care provider a list of all the medicines, herbs, non-prescription drugs, or dietary supplements you use. Also tell them if you smoke,  drink alcohol, or use illegal drugs. Some items may interact with your medicine. What should I watch for while using this medication? Visit your care team regularly. Tell your care team if your symptoms do not start to get better or if they get worse. You may need blood work done while you are taking this medication. You may need to follow a special diet. Talk to your care team. Foods that contain iron include: whole grains/cereals, dried fruits, beans, or peas, leafy green vegetables, and organ meats (liver, kidney). What side effects may I notice from receiving this medication? Side effects that you should report to your care team as soon as possible: Allergic reactions--skin rash, itching, hives, swelling of the face, lips, tongue, or throat Low blood pressure--dizziness, feeling faint or lightheaded, blurry vision Shortness of breath Side effects that usually do not require medical attention (report to your care team if they continue or are bothersome): Flushing Headache Joint pain Muscle pain Nausea Pain, redness, or irritation at injection site This list may not describe all possible side effects. Call your doctor for medical advice about side effects. You may report side effects to FDA at 1-800-FDA-1088. Where should I keep my medication? This medication is given in a hospital or clinic. It will not be stored at home. NOTE: This sheet is a summary. It may not cover all possible information. If you have questions about this medicine, talk to your doctor, pharmacist, or health care provider.  2024 Elsevier/Gold Standard (2022-08-19 00:00:00)

## 2023-01-26 NOTE — Progress Notes (Signed)
Patient tolerated iron infusion with no complaints voiced.  PICC clean and dry with good blood return noted before and after infusion. PICC line dressing changed pt due to have PICC dressing change on 02/02/2023.  VSS with discharge and left in satisfactory condition with no s/s of distress noted. All follow ups as scheduled. Pt escorted via wheelchair to be driven home by granddaughter.  Savannah Burns [employer]

## 2023-01-27 ENCOUNTER — Ambulatory Visit (INDEPENDENT_AMBULATORY_CARE_PROVIDER_SITE_OTHER): Payer: Medicare PPO

## 2023-01-27 VITALS — Ht 62.0 in | Wt 298.0 lb

## 2023-01-27 DIAGNOSIS — Z0000 Encounter for general adult medical examination without abnormal findings: Secondary | ICD-10-CM | POA: Diagnosis not present

## 2023-01-27 NOTE — Progress Notes (Signed)
Subjective:   Savannah Burns is a 72 y.o. female who presents for Medicare Annual (Subsequent) preventive examination.  Visit Complete: Virtual I connected with  [name] on 01/27/23 by a audio enabled telemedicine application and verified that I am speaking with the correct person using two identifiers.  Patient Location: Home  Provider Location: Home Office  I discussed the limitations of evaluation and management by telemedicine. The patient expressed understanding and agreed to proceed.  Vital Signs: Because this visit was a virtual/telehealth visit, some criteria may be missing or patient reported. Any vitals not documented were not able to be obtained and vitals that have been documented are patient reported.  Patient Medicare AWV questionnaire was completed by the patient on 01/27/2023; I have confirmed that all information answered by patient is correct and no changes since this date.  Cardiac Risk Factors include: advanced age (>94men, >83 women);dyslipidemia;diabetes mellitus;hypertension;sedentary lifestyle     Objective:    Today's Vitals   01/27/23 0858  Weight: 298 lb (135.2 kg)  Height: 5\' 2"  (1.575 m)   Body mass index is 54.5 kg/m.     01/27/2023    9:05 AM 12/22/2022   11:34 AM 12/08/2022    9:19 AM 11/30/2022    9:11 AM 11/09/2022    8:36 AM 10/26/2022   11:12 AM 10/26/2022    8:38 AM  Advanced Directives  Does Patient Have a Medical Advance Directive? Yes Yes Yes No Yes Yes No  Type of Estate agent of [place];Living will Living will;Healthcare Power of [employer] Power of [place];Living will  Living will;Healthcare Power of Attorney Living will;Healthcare Power of [employer] Power of [place];Living will  Does patient want to make changes to medical advance directive?  No - Patient declined   No - Patient declined No - Patient declined No - Patient declined  Copy of Healthcare Power of Attorney in Chart?  No - copy requested      No - copy requested  Would patient like information on creating a medical advance directive?  No - Patient declined  No - Patient declined No - Patient declined No - Patient declined No - Patient declined    Current Medications (verified) Outpatient Encounter Medications as of 01/27/2023  Medication Sig   acyclovir (ZOVIRAX) 400 MG tablet Take 1 tablet (400 mg total) by mouth 2 (two) times daily.   baclofen (LIORESAL) 10 MG tablet Take 10 mg by mouth 2 (two) times daily.   Blood Glucose Calibration (TRUE METRIX LEVEL 1) Low SOLN Use w/ glucose monitor Dx E11.9   Blood Glucose Monitoring Suppl (TRUE METRIX AIR GLUCOSE METER) w/Device KIT Check BS daily Dx E11.9   Cholecalciferol (VITAMIN D) 50 MCG (2000 UT) tablet Take 2,000 Units by mouth daily.   clindamycin (CLEOCIN T) 1 % SWAB Apply to affected areas under axilla twice daily   clobetasol (TEMOVATE) 0.05 % external solution Apply 1 Application topically 2 (two) times daily.   Cyanocobalamin (B-12 COMPLIANCE INJECTION) 1000 MCG/ML KIT Inject 1,000 mcg as directed every 30 (thirty) days.   dexamethasone (DECADRON) 4 MG tablet Take 5 tablets (20 mg total) by mouth once a week.   doxycycline (VIBRA-TABS) 100 MG tablet Take 1 tablet (100 mg total) by mouth 2 (two) times daily.   esomeprazole (NEXIUM) 40 MG capsule Take 1 capsule (40 mg total) by mouth daily.   glucose blood (TRUE METRIX BLOOD GLUCOSE TEST) test strip Check BS daily Dx E11.9   lenalidomide (REVLIMID)  15 MG capsule TAKE 1 CAPSULE BY MOUTH EVERY DAY FOR 21 DAYS ON THEN 7 DAYS OFF   lidocaine-prilocaine (EMLA) cream Apply 1 Application topically as needed. Apply a small amount to port a cath site and cover with plastic wrap 1 hour prior to infusion appointments   lovastatin (MEVACOR) 20 MG tablet Take 1 tablet (20 mg total) by mouth at bedtime.   mupirocin ointment (BACTROBAN) 2 % Apply 1 Application topically 2 (two) times daily.   nystatin  (MYCOSTATIN/NYSTOP) powder Apply topically 2 (two) times daily.   oxyCODONE-acetaminophen (PERCOCET) 10-325 MG tablet Take 1 tablet by mouth every 6 (six) hours as needed for pain.   OXYGEN Inhale 3 L into the lungs continuous.   OZEMPIC, 0.25 OR 0.5 MG/DOSE, 2 MG/3ML SOPN Inject 0.25 mg into the skin every 7 (seven) days.   potassium chloride SA (KLOR-CON) 20 MEQ tablet Take 2 tablets (40 mEq total) by mouth 2 (two) times daily. (Patient taking differently: Take 40 mEq by mouth daily.)   prochlorperazine (COMPAZINE) 10 MG tablet Take 1 tablet (10 mg total) by mouth every 6 (six) hours as needed for nausea or vomiting.   TRUEplus Lancets 33G MISC Check BS daily Dx E11.9   venlafaxine XR (EFFEXOR-XR) 150 MG 24 hr capsule Take 1 capsule (150 mg total) by mouth daily with breakfast.   ferrous sulfate 325 (65 FE) MG tablet Take 1 tablet (325 mg total) by mouth daily with breakfast.   torsemide (DEMADEX) 20 MG tablet Take 2.5 tablets (50 mg total) by mouth 2 (two) times daily.   [DISCONTINUED] albuterol (PROVENTIL HFA;VENTOLIN HFA) 108 (90 Base) MCG/ACT inhaler Inhale 2 puffs into the lungs every 6 (six) hours as needed for wheezing or shortness of breath.   Facility-Administered Encounter Medications as of 01/27/2023  Medication   sodium chloride flush (NS) 0.9 % injection 10 mL    Allergies (verified) Keflex [cephalexin]   History: Past Medical History:  Diagnosis Date   Adrenal adenoma, left    Stable   Anxiety    Arthritis    bilateral hands   Depression    Diabetes mellitus, type 2 (HCC) 08/12/2008   Qualifier: Diagnosis of  By: Dayton Martes MD, Talia     Dyspnea    Esophageal varices (HCC)    Grade II diastolic dysfunction    History of kidney stones    Hyperlipidemia    Hypertension    Lower back pain    Lower GI bleed 03/19/2020   Panic attacks    Pneumonia    currently taking antibiotic and prednisone for early stages of pneumonia   Pulmonary nodules    bilateral   Skin cancer     face   Past Surgical History:  Procedure Laterality Date   BIOPSY  04/07/2020   Procedure: BIOPSY;  Surgeon: Lanelle Bal, DO;  [facility];  Service: Endoscopy;;   Breast Cystectomy  Right    CESAREAN SECTION     COLONOSCOPY WITH PROPOFOL N/A 01/25/2020   Dr. Marletta Lor: Nonbleeding internal hemorrhoids, diverticulosis, 5 mm polyp removed from the ascending colon, 10 mm polyp removed from the sigmoid colon, 30 mm polyp (tubulovillous adenoma with no high-grade dysplasia) removed from the transverse colon via piecemeal status post tattoo.  Other polyps were tubular adenomas.  3 month surveillance colonoscopy recommended.   COLONOSCOPY WITH PROPOFOL N/A 04/07/2020   Procedure: COLONOSCOPY WITH PROPOFOL;  Surgeon: Lanelle Bal, DO;  [facility];  Service: Endoscopy;  Laterality: N/A;  3:00pm, pt knows new time per office   CYSTOSCOPY/URETEROSCOPY/HOLMIUM LASER/STENT PLACEMENT Bilateral 03/01/2019   Procedure: CYSTOSCOPY/RETROGRADEURETEROSCOPY/HOLMIUM LASER/STENT PLACEMENT;  Surgeon: Rene Paci, MD;  Location: [facility];  Service: Urology;  Laterality: Bilateral;  ONLY NEEDS 60 MIN   ESOPHAGOGASTRODUODENOSCOPY (EGD) WITH PROPOFOL N/A 01/25/2020   Dr. Marletta Lor: 4 columns grade 1 esophageal varices   ESOPHAGOGASTRODUODENOSCOPY (EGD) WITH PROPOFOL N/A 05/18/2020   Procedure: ESOPHAGOGASTRODUODENOSCOPY (EGD) WITH PROPOFOL;  Surgeon: Lanelle Bal, DO;  [facility];  Service: Endoscopy;  Laterality: N/A;   ESOPHAGOGASTRODUODENOSCOPY (EGD) WITH PROPOFOL N/A 07/28/2020   Procedure: ESOPHAGOGASTRODUODENOSCOPY (EGD) WITH PROPOFOL;  Surgeon: Lanelle Bal, DO;  [facility];  Service: Endoscopy;  Laterality: N/A;  am or early PM due to givens capsule placement   GIVENS CAPSULE STUDY N/A 05/18/2020   Procedure: GIVENS CAPSULE STUDY;  Surgeon: Dolores Frame, MD;  [facility];  Service: Gastroenterology;  Laterality: N/A;    GIVENS CAPSULE STUDY N/A 07/28/2020   Procedure: GIVENS CAPSULE STUDY;  Surgeon: Lanelle Bal, DO;  [facility];  Service: Endoscopy;  Laterality: N/A;   IR IMAGING GUIDED PORT INSERTION  12/02/2021   IRRIGATION AND DEBRIDEMENT ABSCESS Right 09/28/2022   Procedure: IRRIGATION AND DEBRIDEMENT ABSCESS;  Surgeon: Lewie Chamber, DO;  Location: [facility];  Service: General;  Laterality: Right;   POLYPECTOMY  01/25/2020   Procedure: POLYPECTOMY;  Surgeon: Lanelle Bal, DO;  [facility];  Service: Endoscopy;;   POLYPECTOMY  04/07/2020   Procedure: POLYPECTOMY INTESTINAL;  Surgeon: Lanelle Bal, DO;  [facility];  Service: Endoscopy;;   PORT-A-CATH REMOVAL Right 09/28/2022   Procedure: MINOR REMOVAL PORT-A-CATH;  Surgeon: Lewie Chamber, DO;  Location: [facility];  Service: General;  Laterality: Right;   SKIN CANCER EXCISION     Face   SPINE SURGERY     SUBMUCOSAL TATTOO INJECTION  01/25/2020   Procedure: SUBMUCOSAL TATTOO INJECTION;  Surgeon: Lanelle Bal, DO;  [facility];  Service: Endoscopy;;   Family History  Problem Relation Age of Onset   Diabetes Father    Heart disease Father 68       MI   Hypertension Father    Anemia Mother        Transfusion dependent   COPD Sister    Cancer Paternal Grandmother 11       Pancreatic   Social History   Socioeconomic History   Marital status: Widowed    Spouse name: Not on file   Number of children: 2   Years of education: 14   Highest education level: Not on file  Occupational History   Occupation: Retired   Tobacco Use   Smoking status: Former    Current packs/day: 0.00    Average packs/day: 1.5 packs/day for 40.0 years (60.0 ttl pk-yrs)    Types: Cigarettes    Start date: 04/29/1975    Quit date: 04/29/2015    Years since quitting: 7.7   Smokeless tobacco: Never   Tobacco comments:    Quit smoking 04/2015- Previous 1.5 ppd smoker  Vaping Use   Vaping status:  Never Used  Substance and Sexual Activity   Alcohol use: No    Alcohol/week: 0.0 standard drinks of alcohol   Drug use: No   Sexual activity: Not Currently    Birth control/protection: Post-menopausal  Other Topics Concern   Not on file  Social History Narrative   Her 36 year old  granddaughter lives with her - one daughter lives nearby, but she doesn't have a good relationship with her. Has a great relationship with other daughter who lives 1.5 hrs away - talks to her daily on the phone.   Social Determinants of Health   Financial Resource Strain: Low Risk  (01/27/2023)   Overall Financial Resource Strain (CARDIA)    Difficulty of Paying Living Expenses: Not hard at all  Recent Concern: Financial Resource Strain - High Risk (11/15/2022)   Overall Financial Resource Strain (CARDIA)    Difficulty of Paying Living Expenses: Very hard  Food Insecurity: No Food Insecurity (01/27/2023)   Hunger Vital Sign    Worried About Running Out of Food in the Last Year: Never true    Ran Out of Food in the Last Year: Never true  Recent Concern: Food Insecurity - Food Insecurity Present (11/15/2022)   Hunger Vital Sign    Worried About Running Out of Food in the Last Year: Sometimes true    Ran Out of Food in the Last Year: Never true  Transportation Needs: No Transportation Needs (01/27/2023)   PRAPARE - Administrator, Civil Service (Medical): No    Lack of Transportation (Non-Medical): No  Physical Activity: Inactive (01/27/2023)   Exercise Vital Sign    Days of Exercise per Week: 0 days    Minutes of Exercise per Session: 0 min  Stress: No Stress Concern Present (01/27/2023)   [employer] of Occupational Health - Occupational Stress Questionnaire    Feeling of Stress : Not at all  Social Connections: Socially Isolated (01/27/2023)   Social Connection and Isolation Panel [NHANES]    Frequency of Communication with Friends and Family: More than three times a week    Frequency of  Social Gatherings with Friends and Family: More than three times a week    Attends Religious Services: Never    Database administrator or Organizations: No    Attends Banker Meetings: Never    Marital Status: Widowed    Tobacco Counseling Counseling given: Not Answered Tobacco comments: Quit smoking 04/2015- Previous 1.5 ppd smoker   Clinical Intake:  Pre-visit preparation completed: Yes  Pain : No/denies pain     Nutritional Risks: None Diabetes: Yes CBG done?: No Did pt. bring in CBG monitor from home?: No  How often do you need to have someone help you when you read instructions, pamphlets, or other written materials from your doctor or pharmacy?: 1 - Never  Interpreter Needed?: No  Information entered by :: Renie Ora, LPN   Activities of Daily Living    01/27/2023    9:05 AM 01/23/2023    1:33 PM  In your present state of health, do you have any difficulty performing the following activities:  Hearing? 0 1  Vision? 0 0  Difficulty concentrating or making decisions? 0 1  Walking or climbing stairs? 0 1  Dressing or bathing? 0 1  Doing errands, shopping? 0 1  Preparing Food and eating ? N Y  Using the Toilet? N Y  In the past six months, have you accidently leaked urine? N Y  Do you have problems with loss of bowel control? N N  Managing your Medications? N Y  Managing your Finances? N N  Housekeeping or managing your Housekeeping? N Y    Patient Care Team: Raliegh Ip, DO as PCP - General (Family Medicine) Wyline Mood Dorothe Pea, MD as PCP - Cardiology (Cardiology) Letta Kocher, MD (  Rehabilitation) Coralyn Helling, MD (Inactive) as Consulting Physician (Pulmonary Disease) Lanelle Bal, DO as Consulting Physician (Internal Medicine) Merryl Hacker, NP as Nurse Practitioner (Nurse Practitioner) Bonna Gains, MD as Referring Physician (Gastroenterology) Doreatha Massed, MD as Medical Oncologist (Medical Oncology) Therese Sarah, RN as Oncology Nurse Navigator (Medical Oncology) Clinton Gallant, RN as [facility] Care Management  Indicate any recent Medical Services you may have received from other than [facility] providers in the past year (date may be approximate).     Assessment:   This is a routine wellness examination for [place].  Hearing/Vision screen Vision Screening - Comments:: Declined due to health    Goals Addressed             This Visit's Progress    DIET - INCREASE WATER INTAKE         Depression Screen    01/27/2023    9:02 AM 12/02/2022    3:32 PM 06/02/2022    9:19 AM 02/25/2022    1:43 PM 10/19/2021   12:12 PM 08/12/2021    1:34 PM 07/27/2021    9:22 AM  PHQ 2/9 Scores  PHQ - 2 Score 0 3 3 3  0 3 2  PHQ- 9 Score 0 7 7 9  9 7     Fall Risk    01/27/2023    8:59 AM 01/23/2023    1:33 PM 12/02/2022    3:21 PM 06/02/2022    9:20 AM 02/25/2022    1:43 PM  Fall Risk   Falls in the past year? 0  0 0 0  Number falls in past yr: 0 0 0 0   Injury with Fall? 0  0 0   Risk for fall due to : No Fall Risks  No Fall Risks No Fall Risks   Follow up Falls prevention discussed  Education provided Falls evaluation completed     MEDICARE RISK AT HOME: Medicare Risk at Home Any stairs in or around the home?: No If so, are there any without handrails?: No Home free of loose throw rugs in walkways, pet beds, electrical cords, etc?: Yes Adequate lighting in your home to reduce risk of falls?: Yes Life alert?: No Use of a cane, walker or w/c?: Yes Grab bars in the bathroom?: Yes Shower chair or bench in shower?: Yes Elevated toilet seat or a handicapped toilet?: Yes  TIMED UP AND GO:  Was the test performed?  No    Cognitive Function:        01/27/2023    9:06 AM 07/27/2021    9:19 AM  6CIT Screen  What Year? 0 points 0 points  What month? 0 points 0 points  What time? 0 points 0 points  Count back from 20 0 points 0 points  Months in reverse 0 points 4 points   Repeat phrase 0 points 6 points  Total Score 0 points 10 points    Immunizations Immunization History  Administered Date(s) Administered   Fluad Quad(high Dose 65+) 12/09/2020   Hep B, Unspecified 05/30/1991, 06/27/1991, 12/04/1991   Hepatitis B 05/30/1991, 06/27/1991, 12/04/1991   Influenza Whole 01/04/2008, 03/24/2009   Influenza, High Dose Seasonal PF 01/17/2018, 12/29/2018   Influenza,inj,Quad PF,6+ Mos 11/27/2014   Influenza-Unspecified 01/26/2013, 03/01/2014   Moderna Sars-Covid-2 Vaccination 06/12/2019   Pneumococcal Conjugate-13 03/01/2014   Pneumococcal Polysaccharide-23 09/08/2015   Td 05/27/2002   Tdap 09/04/2020, 05/23/2022   Zoster, Live 02/14/2013    TDAP status: Up to date  Flu Vaccine  status: Due, Education has been provided regarding the importance of this vaccine. Advised may receive this vaccine at local pharmacy or Health Dept. Aware to provide a copy of the vaccination record if obtained from local pharmacy or Health Dept. Verbalized acceptance and understanding.  Pneumococcal vaccine status: Up to date  Covid-19 vaccine status: Completed vaccines  Qualifies for Shingles Vaccine? Yes   Zostavax completed No   Shingrix Completed?: No.    Education has been provided regarding the importance of this vaccine. Patient has been advised to call insurance company to determine out of pocket expense if they have not yet received this vaccine. Advised may also receive vaccine at local pharmacy or Health Dept. Verbalized acceptance and understanding.  Screening Tests Health Maintenance  Topic Date Due   Zoster Vaccines- Shingrix (1 of 2) 09[birth year]12/1969   Diabetic kidney evaluation - Urine ACR  03/01/2018   COVID-19 Vaccine (2 - Moderna risk series) 07/10/2019   OPHTHALMOLOGY EXAM  08/06/2021   FOOT EXAM  09/04/2021   INFLUENZA VACCINE  02/25/2023 (Originally 10/27/2022)   MAMMOGRAM  02/26/2023 (Originally 09/13/1999)   DEXA SCAN  02/26/2023 (Originally 09/13/2014)    HEMOGLOBIN A1C  03/29/2023   Lung Cancer Screening  09/26/2023   Diabetic kidney evaluation - eGFR measurement  01/26/2024   Medicare Annual Wellness (AWV)  01/27/2024   Colonoscopy  04/07/2030   DTaP/Tdap/Td (4 - Td or Tdap) 05/23/2032   Pneumonia Vaccine 77+ Years old  Completed   Hepatitis C Screening  Completed   HPV VACCINES  Aged Out    Health Maintenance  Health Maintenance Due  Topic Date Due   Zoster Vaccines- Shingrix (1 of 2) 09/12/1968   Diabetic kidney evaluation - Urine ACR  03/01/2018   COVID-19 Vaccine (2 - Moderna risk series) 07/10/2019   OPHTHALMOLOGY EXAM  08/06/2021   FOOT EXAM  09/04/2021    Colorectal cancer screening: Type of screening: Colonoscopy. Completed 04/07/2020. Repeat every 10 years  Mammogram status: Ordered declined . Pt provided with contact info and advised to call to schedule appt.   Bone Density status: Ordered declined . Pt provided with contact info and advised to call to schedule appt.  Lung Cancer Screening: (Low Dose CT Chest recommended if Age 89-80 years, 20 pack-year currently smoking OR have quit w/in 15years.) does qualify.   Lung Cancer Screening Referral: has oncologist   Additional Screening:  Hepatitis C Screening: does not qualify; Completed 09/08/2015  Vision Screening: Recommended annual ophthalmology exams for early detection of glaucoma and other disorders of the eye. Is the patient up to date with their annual eye exam?  No  Who is the provider or what is the name of the office in which the patient attends annual eye exams? None declined due to health  If pt is not established with a provider, would they like to be referred to a provider to establish care? No .   Dental Screening: Recommended annual dental exams for proper oral hygiene  Diabetic Foot Exam: Diabetic Foot Exam: Overdue, Pt has been advised about the importance in completing this exam. Pt is scheduled for diabetic foot exam on next office visit  .  Community Resource Referral / Chronic Care Management: CRR required this visit?  No   CCM required this visit?  No     Plan:     I have personally reviewed and noted the following in the patient's chart:   Medical and social history Use of alcohol, tobacco or illicit drugs  Current  medications and supplements including opioid prescriptions. Patient is not currently taking opioid prescriptions. Functional ability and status Nutritional status Physical activity Advanced directives List of other physicians Hospitalizations, surgeries, and ER visits in previous 12 months Vitals Screenings to include cognitive, depression, and falls Referrals and appointments  In addition, I have reviewed and discussed with patient certain preventive protocols, quality metrics, and best practice recommendations. A written personalized care plan for preventive services as well as general preventive health recommendations were provided to patient.     Lorrene Reid, LPN   16/03/958   After Visit Summary: (MyChart) Due to this being a telephonic visit, the after visit summary with patients personalized plan was offered to patient via MyChart   Nurse Notes: none

## 2023-01-27 NOTE — Patient Instructions (Signed)
Savannah Burns , Thank you for taking time to come for your Medicare Wellness Visit. I appreciate your ongoing commitment to your health goals. Please review the following plan we discussed and let me know if I can assist you in the future.   Referrals/Orders/Follow-Ups/Clinician Recommendations: Aim for 30 minutes of exercise or brisk walking, 6-8 glasses of water, and 5 servings of fruits and vegetables each day.   This is a list of the screening recommended for you and due dates:  Health Maintenance  Topic Date Due   Zoster (Shingles) Vaccine (1 of 2) 09/12/1968   Yearly kidney health urinalysis for diabetes  03/01/2018   COVID-19 Vaccine (2 - Moderna risk series) 07/10/2019   Eye exam for diabetics  08/06/2021   Complete foot exam   09/04/2021   Flu Shot  02/25/2023*   Mammogram  02/26/2023*   DEXA scan (bone density measurement)  02/26/2023*   Hemoglobin A1C  03/29/2023   Screening for Lung Cancer  09/26/2023   Yearly kidney function blood test for diabetes  01/26/2024   Medicare Annual Wellness Visit  01/27/2024   Colon Cancer Screening  04/07/2030   DTaP/Tdap/Td vaccine (4 - Td or Tdap) 05/23/2032   Pneumonia Vaccine  Completed   Hepatitis C Screening  Completed   HPV Vaccine  Aged Out  *Topic was postponed. The date shown is not the original due date.    Advanced directives: (Copy Requested) Please bring a copy of your health care power of attorney and living will to the office to be added to your chart at your convenience.  Next Medicare Annual Wellness Visit scheduled for next year: Yes  Insert Preventive Care attachment Insert FALL PREVENTION attachment if needed

## 2023-01-29 ENCOUNTER — Other Ambulatory Visit: Payer: Self-pay

## 2023-01-30 ENCOUNTER — Other Ambulatory Visit: Payer: Self-pay | Admitting: *Deleted

## 2023-01-30 MED ORDER — NYSTATIN 100000 UNIT/GM EX POWD: Freq: Two times a day (BID) | CUTANEOUS | 1 refills | Status: DC

## 2023-02-02 ENCOUNTER — Inpatient Hospital Stay: Payer: Medicare PPO | Attending: Hematology

## 2023-02-02 DIAGNOSIS — R531 Weakness: Secondary | ICD-10-CM | POA: Diagnosis not present

## 2023-02-02 DIAGNOSIS — G629 Polyneuropathy, unspecified: Secondary | ICD-10-CM | POA: Diagnosis not present

## 2023-02-02 DIAGNOSIS — Z5112 Encounter for antineoplastic immunotherapy: Secondary | ICD-10-CM | POA: Diagnosis not present

## 2023-02-02 DIAGNOSIS — R509 Fever, unspecified: Secondary | ICD-10-CM | POA: Insufficient documentation

## 2023-02-02 DIAGNOSIS — R161 Splenomegaly, not elsewhere classified: Secondary | ICD-10-CM | POA: Diagnosis not present

## 2023-02-02 DIAGNOSIS — Z87891 Personal history of nicotine dependence: Secondary | ICD-10-CM | POA: Insufficient documentation

## 2023-02-02 DIAGNOSIS — R0602 Shortness of breath: Secondary | ICD-10-CM | POA: Diagnosis not present

## 2023-02-02 DIAGNOSIS — Z7985 Long-term (current) use of injectable non-insulin antidiabetic drugs: Secondary | ICD-10-CM | POA: Insufficient documentation

## 2023-02-02 DIAGNOSIS — E785 Hyperlipidemia, unspecified: Secondary | ICD-10-CM | POA: Insufficient documentation

## 2023-02-02 DIAGNOSIS — Z7961 Long term (current) use of immunomodulator: Secondary | ICD-10-CM | POA: Diagnosis not present

## 2023-02-02 DIAGNOSIS — D3502 Benign neoplasm of left adrenal gland: Secondary | ICD-10-CM | POA: Insufficient documentation

## 2023-02-02 DIAGNOSIS — Z8041 Family history of malignant neoplasm of ovary: Secondary | ICD-10-CM | POA: Insufficient documentation

## 2023-02-02 DIAGNOSIS — K7460 Unspecified cirrhosis of liver: Secondary | ICD-10-CM | POA: Diagnosis not present

## 2023-02-02 DIAGNOSIS — C9000 Multiple myeloma not having achieved remission: Secondary | ICD-10-CM | POA: Insufficient documentation

## 2023-02-02 DIAGNOSIS — D500 Iron deficiency anemia secondary to blood loss (chronic): Secondary | ICD-10-CM | POA: Insufficient documentation

## 2023-02-02 DIAGNOSIS — E1142 Type 2 diabetes mellitus with diabetic polyneuropathy: Secondary | ICD-10-CM | POA: Diagnosis not present

## 2023-02-02 DIAGNOSIS — M7989 Other specified soft tissue disorders: Secondary | ICD-10-CM | POA: Diagnosis not present

## 2023-02-02 DIAGNOSIS — Z79624 Long term (current) use of inhibitors of nucleotide synthesis: Secondary | ICD-10-CM | POA: Diagnosis not present

## 2023-02-02 DIAGNOSIS — I10 Essential (primary) hypertension: Secondary | ICD-10-CM | POA: Insufficient documentation

## 2023-02-02 DIAGNOSIS — Z87442 Personal history of urinary calculi: Secondary | ICD-10-CM | POA: Insufficient documentation

## 2023-02-02 DIAGNOSIS — D696 Thrombocytopenia, unspecified: Secondary | ICD-10-CM | POA: Diagnosis not present

## 2023-02-02 DIAGNOSIS — Z85828 Personal history of other malignant neoplasm of skin: Secondary | ICD-10-CM | POA: Diagnosis not present

## 2023-02-02 LAB — CBC WITH DIFFERENTIAL/PLATELET
Abs Immature Granulocytes: 0.01 10*3/uL (ref 0.00–0.07)
Basophils Absolute: 0 10*3/uL (ref 0.0–0.1)
Basophils Relative: 0 %
Eosinophils Absolute: 0.1 10*3/uL (ref 0.0–0.5)
Eosinophils Relative: 2 %
HCT: 40.1 % (ref 36.0–46.0)
Hemoglobin: 13.3 g/dL (ref 12.0–15.0)
Immature Granulocytes: 0 %
Lymphocytes Relative: 35 %
Lymphs Abs: 1.3 10*3/uL (ref 0.7–4.0)
MCH: 33.3 pg (ref 26.0–34.0)
MCHC: 33.2 g/dL (ref 30.0–36.0)
MCV: 100.5 fL — ABNORMAL HIGH (ref 80.0–100.0)
Monocytes Absolute: 0.5 10*3/uL (ref 0.1–1.0)
Monocytes Relative: 14 %
Neutro Abs: 1.8 10*3/uL (ref 1.7–7.7)
Neutrophils Relative %: 49 %
Platelets: 88 10*3/uL — ABNORMAL LOW (ref 150–400)
RBC: 3.99 MIL/uL (ref 3.87–5.11)
RDW: 14.5 % (ref 11.5–15.5)
WBC: 3.6 10*3/uL — ABNORMAL LOW (ref 4.0–10.5)
nRBC: 0 % (ref 0.0–0.2)

## 2023-02-02 LAB — SAMPLE TO BLOOD BANK

## 2023-02-02 MED ORDER — SODIUM CHLORIDE 0.9% FLUSH
10.0000 mL | INTRAVENOUS | Status: DC | PRN
  Administered 2023-02-02: 10 mL via INTRAVENOUS

## 2023-02-02 MED ORDER — HEPARIN SOD (PORK) LOCK FLUSH 100 UNIT/ML IV SOLN
500.0000 [IU] | Freq: Once | INTRAVENOUS | Status: AC
  Administered 2023-02-02: 500 [IU] via INTRAVENOUS

## 2023-02-02 NOTE — Progress Notes (Signed)
[place] G Vorn[place] presented for PICC line flush with labs. Proper placement of PICC confirmed by CXR. PICC line located in the left antecubital. Clean, Dry, and Intact Good blood return present. PICC line flushed with 10ml NS and 50U/96ml  Heparin per protocol.Procedure without incident. Patient tolerated procedure well.

## 2023-02-06 ENCOUNTER — Other Ambulatory Visit: Payer: Self-pay | Admitting: Hematology

## 2023-02-06 DIAGNOSIS — C9000 Multiple myeloma not having achieved remission: Secondary | ICD-10-CM

## 2023-02-07 ENCOUNTER — Other Ambulatory Visit: Payer: Self-pay

## 2023-02-07 ENCOUNTER — Encounter: Payer: Self-pay | Admitting: Hematology

## 2023-02-07 ENCOUNTER — Encounter (HOSPITAL_COMMUNITY): Payer: Self-pay | Admitting: Hematology

## 2023-02-07 DIAGNOSIS — C9000 Multiple myeloma not having achieved remission: Secondary | ICD-10-CM

## 2023-02-07 MED ORDER — LENALIDOMIDE 15 MG PO CAPS: ORAL_CAPSULE | ORAL | 0 refills | Status: DC

## 2023-02-07 NOTE — Telephone Encounter (Signed)
Chart reviewed. Revlimid refilled per last office note with Dr. Katragadda.  

## 2023-02-08 DIAGNOSIS — R718 Other abnormality of red blood cells: Secondary | ICD-10-CM | POA: Diagnosis not present

## 2023-02-08 DIAGNOSIS — Z0130 Encounter for examination of blood pressure without abnormal findings: Secondary | ICD-10-CM | POA: Diagnosis not present

## 2023-02-08 DIAGNOSIS — D649 Anemia, unspecified: Secondary | ICD-10-CM | POA: Diagnosis not present

## 2023-02-08 DIAGNOSIS — F1120 Opioid dependence, uncomplicated: Secondary | ICD-10-CM | POA: Diagnosis not present

## 2023-02-08 DIAGNOSIS — E119 Type 2 diabetes mellitus without complications: Secondary | ICD-10-CM | POA: Diagnosis not present

## 2023-02-08 DIAGNOSIS — E559 Vitamin D deficiency, unspecified: Secondary | ICD-10-CM | POA: Diagnosis not present

## 2023-02-08 DIAGNOSIS — Z8639 Personal history of other endocrine, nutritional and metabolic disease: Secondary | ICD-10-CM | POA: Diagnosis not present

## 2023-02-08 DIAGNOSIS — I10 Essential (primary) hypertension: Secondary | ICD-10-CM | POA: Diagnosis not present

## 2023-02-08 DIAGNOSIS — M51369 Other intervertebral disc degeneration, lumbar region without mention of lumbar back pain or lower extremity pain: Secondary | ICD-10-CM | POA: Diagnosis not present

## 2023-02-08 DIAGNOSIS — Z6841 Body Mass Index (BMI) 40.0 and over, adult: Secondary | ICD-10-CM | POA: Diagnosis not present

## 2023-02-08 NOTE — Progress Notes (Signed)
[facility] [address], [place] [address]    Clinic Day:  02/09/2023  Referring physician: Raliegh Ip, DO  Patient Care Team: Raliegh Ip, DO as PCP - General (Family Medicine) Wyline Mood Dorothe Pea, MD as PCP - Cardiology (Cardiology) Letta Kocher, MD (Rehabilitation) Coralyn Helling, MD (Inactive) as Consulting Physician (Pulmonary Disease) Lanelle Bal, DO as Consulting Physician (Internal Medicine) Merryl Hacker, NP as Nurse Practitioner (Nurse Practitioner) Bonna Gains, MD as Referring Physician (Gastroenterology) Doreatha Massed, MD as Medical Oncologist (Medical Oncology) Therese Sarah, RN as Oncology Nurse Navigator (Medical Oncology) Clinton Gallant, RN as [facility] Care Management   ASSESSMENT & PLAN:   Assessment: 1. 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  Orderly maturation of erythroid and myeloid series. - Myeloma FISH panel: t(11;14) - Cytogenetics: Failed to grow metaphases - PET scan (11/18/2021): No evidence of active myeloma or plasmacytoma.  Right upper lobe lung infection. - Skull x-rays (10/29/2021) multiple discrete round lucent/lytic lesions in the skull - Worsening M spike and free light chain ratio.  Normal creatinine and calcium.  Multifactorial anemia including blood loss and bone marrow infiltration. - Cycle 1 of DRd started on 12/14/2021 with lenalidomide 15 mg 3 weeks on/1 week off and dexamethasone 20 mg weekly - Hospitalized from 09/26/2022 through 10/03/2022 with MSSA bacteremia, port removed, cefepime started, to continue until 10/26/2022   2.  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  Quit smoking 15 years ago.  No exposure to chemicals or pesticides. - Paternal grandmother had ovarian cancer.  Sister had ovarian cancer.   3.  Cirrhosis: -  Likely secondary to NAFLD with splenomegaly resulting in mild thrombocytopenia.  Seen by transplant hepatology service at [facility].    Plan: 1.  Stage II IgG kappa myeloma, standard risk: - She is tolerating monthly daratumumab reasonably well.  She is continuing Revlimid 15 mg 3 weeks on/1 week off. - Denies any hospitalizations or infections. - Reviewed labs from 02/09/2023: Platelet count 85K, white count 3.9.  Hemoglobin is normal.  Myeloma labs from 01/12/2023: M spike stable at 0.3.  Kappa light chains 15.9 and ratio of 3.24. - Continue monthly Darzalex.  Decrease dexamethasone to 20 mg monthly on the day of Darzalex.  Continue Revlimid 15 mg 3 weeks on/1 week off. - We will discontinue PICC line and place a port. - RTC 12 weeks for follow-up with repeat myeloma labs.   2.  Iron deficiency anemia from chronic GI bleed: - She is receiving Venofer 500 mg every 2 weeks.  Hemoglobin has been stable between 12 and 13 for the last 6 times.  Ferritin today is 578 and percent saturation 136.  Last ferritin was 441. - Will switch Venofer to 500 mg every 4 weeks.   3.  Infection prophylaxis: - Continue acyclovir 400 mg twice daily.  Aspirin on hold for bleeding.   4.  Cirrhosis: - Continue Lasix 20 mg daily, carvedilol 6.5 mg daily and lactulose daily.   5.  Peripheral neuropathy: - This is mostly in the feet and stable.    Orders Placed This Encounter  Procedures   CBC with Differential    Standing Status:   Future    Standing Expiration Date:   05/31/2024      Alben Deeds Teague,acting as a scribe for Doreatha Massed, MD.,have documented  all relevant documentation on the behalf of Doreatha Massed, MD,as directed by  Doreatha Massed, MD while in the presence of Doreatha Massed, MD.  I, Doreatha Massed MD, have reviewed the above documentation for accuracy and completeness, and I agree with the above.    Doreatha Massed, MD   11/14/20245:34 PM  CHIEF  COMPLAINT:   Diagnosis: stage II standard risk IgG kappa plasma cell myeloma    Cancer Staging  Multiple myeloma (HCC) Staging form: Plasma Cell Myeloma and Plasma Cell Disorders, AJCC 8th Edition - Clinical stage from 11/30/2021: RISS Stage II (Beta-2-microglobulin (mg/L): 2.8, Albumin (g/dL): 3, ISS: Stage II, High-risk cytogenetics: Absent, LDH: Normal) - Signed by Doreatha Massed, MD on 11/30/2021    Prior Therapy: none  Current Therapy:  Daratumumab, lenalidomide and dexamethasone    HISTORY OF PRESENT ILLNESS:   Oncology History  Multiple myeloma (HCC)  11/30/2021 Initial Diagnosis   Multiple myeloma (HCC)   11/30/2021 Cancer Staging   Staging form: Plasma Cell Myeloma and Plasma Cell Disorders, AJCC 8th Edition - Clinical stage from 11/30/2021: RISS Stage II (Beta-2-microglobulin (mg/L): 2.8, Albumin (g/dL): 3, ISS: Stage II, High-risk cytogenetics: Absent, LDH: Normal) - Signed by Doreatha Massed, MD on 11/30/2021 Histopathologic type: Multiple myeloma Stage prefix: Initial diagnosis Beta 2 microglobulin range (mg/L): Less than 3.5 Albumin range (g/dL): Less than 3.5 Cytogenetics: t(11;14) translocation Serum calcium level: Normal   12/14/2021 -  Chemotherapy   Patient is on Treatment Plan : MYELOMA  Daratumumab SQ + Lenalidomide + Dexamethasone (DaraRd) q28d        INTERVAL HISTORY:   Savannah Burns is a 72 y.o. female presenting to clinic today for follow up of stage II standard risk IgG kappa plasma cell myeloma. She was last seen by me on 11/30/22.  Today, she states that she is doing well overall. Her appetite level is at 100%. Her energy level is at 50%.  She denies infections in the last 3 months, losing any blood recently, BRBPR, or melena. She notes she has a pain doctor in [place], she refers to as Dr. Kirtland Bouchard. She denies any issues with Revlimid and is taking as prescribed. She denies any issues with Daratumumab injections. Soreness in the tongue from last visit has  resolved. She would like to know if she can discontinue dexamethasone and states she needs a refill if not. She reports bilateral leg swelling, likely due to taking steroids.   PAST MEDICAL HISTORY:   Past Medical History: Past Medical History:  Diagnosis Date   Adrenal adenoma, left    Stable   Anxiety    Arthritis    bilateral hands   Depression    Diabetes mellitus, type 2 (HCC) 08/12/2008   Qualifier: Diagnosis of  By: Dayton Martes MD, Talia     Dyspnea    Esophageal varices (HCC)    Grade II diastolic dysfunction    History of kidney stones    Hyperlipidemia    Hypertension    Lower back pain    Lower GI bleed 03/19/2020   Panic attacks    Pneumonia    currently taking antibiotic and prednisone for early stages of pneumonia   Pulmonary nodules    bilateral   Skin cancer    face    Surgical History: Past Surgical History:  Procedure Laterality Date   BIOPSY  04/07/2020   Procedure: BIOPSY;  Surgeon: Lanelle Bal, DO;  [facility];  Service: Endoscopy;;   Breast Cystectomy  Right    CESAREAN SECTION  COLONOSCOPY WITH PROPOFOL N/A 01/25/2020   Dr. Marletta Lor: Nonbleeding internal hemorrhoids, diverticulosis, 5 mm polyp removed from the ascending colon, 10 mm polyp removed from the sigmoid colon, 30 mm polyp (tubulovillous adenoma with no high-grade dysplasia) removed from the transverse colon via piecemeal status post tattoo.  Other polyps were tubular adenomas.  3 month surveillance colonoscopy recommended.   COLONOSCOPY WITH PROPOFOL N/A 04/07/2020   Procedure: COLONOSCOPY WITH PROPOFOL;  Surgeon: Lanelle Bal, DO;  [facility];  Service: Endoscopy;  Laterality: N/A;  3:00pm, pt knows new time per office   CYSTOSCOPY/URETEROSCOPY/HOLMIUM LASER/STENT PLACEMENT Bilateral 03/01/2019   Procedure: CYSTOSCOPY/RETROGRADEURETEROSCOPY/HOLMIUM LASER/STENT PLACEMENT;  Surgeon: Rene Paci, MD;  Location: [facility];  Service: Urology;  Laterality:  Bilateral;  ONLY NEEDS 60 MIN   ESOPHAGOGASTRODUODENOSCOPY (EGD) WITH PROPOFOL N/A 01/25/2020   Dr. Marletta Lor: 4 columns grade 1 esophageal varices   ESOPHAGOGASTRODUODENOSCOPY (EGD) WITH PROPOFOL N/A 05/18/2020   Procedure: ESOPHAGOGASTRODUODENOSCOPY (EGD) WITH PROPOFOL;  Surgeon: Lanelle Bal, DO;  [facility];  Service: Endoscopy;  Laterality: N/A;   ESOPHAGOGASTRODUODENOSCOPY (EGD) WITH PROPOFOL N/A 07/28/2020   Procedure: ESOPHAGOGASTRODUODENOSCOPY (EGD) WITH PROPOFOL;  Surgeon: Lanelle Bal, DO;  [facility];  Service: Endoscopy;  Laterality: N/A;  am or early PM due to givens capsule placement   GIVENS CAPSULE STUDY N/A 05/18/2020   Procedure: GIVENS CAPSULE STUDY;  Surgeon: Dolores Frame, MD;  [facility];  Service: Gastroenterology;  Laterality: N/A;   GIVENS CAPSULE STUDY N/A 07/28/2020   Procedure: GIVENS CAPSULE STUDY;  Surgeon: Lanelle Bal, DO;  [facility];  Service: Endoscopy;  Laterality: N/A;   IR IMAGING GUIDED PORT INSERTION  12/02/2021   IRRIGATION AND DEBRIDEMENT ABSCESS Right 09/28/2022   Procedure: IRRIGATION AND DEBRIDEMENT ABSCESS;  Surgeon: Lewie Chamber, DO;  Location: [facility];  Service: General;  Laterality: Right;   POLYPECTOMY  01/25/2020   Procedure: POLYPECTOMY;  Surgeon: Lanelle Bal, DO;  [facility];  Service: Endoscopy;;   POLYPECTOMY  04/07/2020   Procedure: POLYPECTOMY INTESTINAL;  Surgeon: Lanelle Bal, DO;  [facility];  Service: Endoscopy;;   PORT-A-CATH REMOVAL Right 09/28/2022   Procedure: MINOR REMOVAL PORT-A-CATH;  Surgeon: Lewie Chamber, DO;  Location: [facility];  Service: General;  Laterality: Right;   SKIN CANCER EXCISION     Face   SPINE SURGERY     SUBMUCOSAL TATTOO INJECTION  01/25/2020   Procedure: SUBMUCOSAL TATTOO INJECTION;  Surgeon: Lanelle Bal, DO;  [facility];  Service: Endoscopy;;    Social  History: Social History   Socioeconomic History   Marital status: Widowed    Spouse name: Not on file   Number of children: 2   Years of education: 14   Highest education level: Not on file  Occupational History   Occupation: Retired   Tobacco Use   Smoking status: Former    Current packs/day: 0.00    Average packs/day: 1.5 packs/day for 40.0 years (60.0 ttl pk-yrs)    Types: Cigarettes    Start date: 04/29/1975    Quit date: 04/29/2015    Years since quitting: 7.7   Smokeless tobacco: Never   Tobacco comments:    Quit smoking 04/2015- Previous 1.5 ppd smoker  Vaping Use   Vaping status: Never Used  Substance and Sexual Activity   Alcohol use: No    Alcohol/week: 0.0 standard drinks of alcohol   Drug use: No  Sexual activity: Not Currently    Birth control/protection: Post-menopausal  Other Topics Concern   Not on file  Social History Narrative   Her 26 year old granddaughter lives with her - one daughter lives nearby, but she doesn't have a good relationship with her. Has a great relationship with other daughter who lives 1.5 hrs away - talks to her daily on the phone.   Social Determinants of Health   Financial Resource Strain: Low Risk  (01/27/2023)   Overall Financial Resource Strain (CARDIA)    Difficulty of Paying Living Expenses: Not hard at all  Recent Concern: Financial Resource Strain - High Risk (11/15/2022)   Overall Financial Resource Strain (CARDIA)    Difficulty of Paying Living Expenses: Very hard  Food Insecurity: No Food Insecurity (01/27/2023)   Hunger Vital Sign    Worried About Running Out of Food in the Last Year: Never true    Ran Out of Food in the Last Year: Never true  Recent Concern: Food Insecurity - Food Insecurity Present (11/15/2022)   Hunger Vital Sign    Worried About Running Out of Food in the Last Year: Sometimes true    Ran Out of Food in the Last Year: Never true  Transportation Needs: No Transportation Needs (01/27/2023)   PRAPARE -  Administrator, Civil Service (Medical): No    Lack of Transportation (Non-Medical): No  Physical Activity: Inactive (01/27/2023)   Exercise Vital Sign    Days of Exercise per Week: 0 days    Minutes of Exercise per Session: 0 min  Stress: No Stress Concern Present (01/27/2023)   [employer] of Occupational Health - Occupational Stress Questionnaire    Feeling of Stress : Not at all  Social Connections: Socially Isolated (01/27/2023)   Social Connection and Isolation Panel [NHANES]    Frequency of Communication with Friends and Family: More than three times a week    Frequency of Social Gatherings with Friends and Family: More than three times a week    Attends Religious Services: Never    Database administrator or Organizations: No    Attends Banker Meetings: Never    Marital Status: Widowed  Intimate Partner Violence: Not At Risk (01/27/2023)   Humiliation, Afraid, Rape, and Kick questionnaire    Fear of Current or Ex-Partner: No    Emotionally Abused: No    Physically Abused: No    Sexually Abused: No    Family History: Family History  Problem Relation Age of Onset   Diabetes Father    Heart disease Father 17       MI   Hypertension Father    Anemia Mother        Transfusion dependent   COPD Sister    Cancer Paternal Grandmother 60       Pancreatic    Current Medications:  Current Outpatient Medications:    acyclovir (ZOVIRAX) 400 MG tablet, Take 1 tablet (400 mg total) by mouth 2 (two) times daily., Disp: 60 tablet, Rfl: 6   baclofen (LIORESAL) 10 MG tablet, Take 10 mg by mouth 2 (two) times daily., Disp: , Rfl:    Blood Glucose Calibration (TRUE METRIX LEVEL 1) Low SOLN, Use w/ glucose monitor Dx E11.9, Disp: 3 each, Rfl: 0   Blood Glucose Monitoring Suppl (TRUE METRIX AIR GLUCOSE METER) w/Device KIT, Check BS daily Dx E11.9, Disp: 1 kit, Rfl: 0   Cholecalciferol (VITAMIN D) 50 MCG (2000 UT) tablet, Take 2,000 Units by  mouth daily., Disp:  , Rfl:    clindamycin (CLEOCIN T) 1 % SWAB, Apply to affected areas under axilla twice daily, Disp: 180 each, Rfl: 3   clobetasol (TEMOVATE) 0.05 % external solution, Apply 1 Application topically 2 (two) times daily., Disp: , Rfl:    Cyanocobalamin (B-12 COMPLIANCE INJECTION) 1000 MCG/ML KIT, Inject 1,000 mcg as directed every 30 (thirty) days., Disp: , Rfl:    doxycycline (VIBRA-TABS) 100 MG tablet, Take 1 tablet (100 mg total) by mouth 2 (two) times daily., Disp: 20 tablet, Rfl: 0   esomeprazole (NEXIUM) 40 MG capsule, Take 1 capsule (40 mg total) by mouth daily., Disp: 30 capsule, Rfl: 2   glucose blood (TRUE METRIX BLOOD GLUCOSE TEST) test strip, Check BS daily Dx E11.9, Disp: 100 each, Rfl: 3   lenalidomide (REVLIMID) 15 MG capsule, TAKE 1 CAPSULE BY MOUTH EVERY DAY FOR 21 DAYS ON THEN 7 DAYS OFF, Disp: 21 capsule, Rfl: 0   lidocaine-prilocaine (EMLA) cream, Apply 1 Application topically as needed. Apply a small amount to port a cath site and cover with plastic wrap 1 hour prior to infusion appointments, Disp: 30 g, Rfl: 3   lovastatin (MEVACOR) 20 MG tablet, Take 1 tablet (20 mg total) by mouth at bedtime., Disp: 90 tablet, Rfl: 3   mupirocin ointment (BACTROBAN) 2 %, Apply 1 Application topically 2 (two) times daily., Disp: 22 g, Rfl: 1   nystatin (MYCOSTATIN/NYSTOP) powder, Apply topically 2 (two) times daily., Disp: 15 g, Rfl: 1   oxyCODONE-acetaminophen (PERCOCET) 10-325 MG tablet, Take 1 tablet by mouth every 6 (six) hours as needed for pain., Disp: , Rfl:    OXYGEN, Inhale 3 L into the lungs continuous., Disp: , Rfl:    OZEMPIC, 0.25 OR 0.5 MG/DOSE, 2 MG/3ML SOPN, Inject 0.25 mg into the skin every 7 (seven) days., Disp: 9 mL, Rfl: 3   potassium chloride SA (KLOR-CON) 20 MEQ tablet, Take 2 tablets (40 mEq total) by mouth 2 (two) times daily. (Patient taking differently: Take 40 mEq by mouth daily.), Disp: 360 tablet, Rfl: 3   prochlorperazine (COMPAZINE) 10 MG tablet, Take 1 tablet (10  mg total) by mouth every 6 (six) hours as needed for nausea or vomiting., Disp: 30 tablet, Rfl: 6   TRUEplus Lancets 33G MISC, Check BS daily Dx E11.9, Disp: 100 each, Rfl: 3   venlafaxine XR (EFFEXOR-XR) 150 MG 24 hr capsule, Take 1 capsule (150 mg total) by mouth daily with breakfast., Disp: 30 capsule, Rfl: 2   dexamethasone (DECADRON) 4 MG tablet, Take 5 tablets once every 4 weeks prior to darzalex, Disp: 20 tablet, Rfl: 3   ferrous sulfate 325 (65 FE) MG tablet, Take 1 tablet (325 mg total) by mouth daily with breakfast., Disp: 30 tablet, Rfl: 3   torsemide (DEMADEX) 20 MG tablet, Take 2.5 tablets (50 mg total) by mouth 2 (two) times daily., Disp: , Rfl:  No current facility-administered medications for this visit.  Facility-Administered Medications Ordered in Other Visits:    sodium chloride flush (NS) 0.9 % injection 10 mL, 10 mL, Intravenous, PRN, Pennington, Rebekah M, PA-C, 10 mL at 03/08/22 1344   Allergies: No Active Allergies   REVIEW OF SYSTEMS:   Review of Systems  Constitutional:  Negative for chills, fatigue and fever.  HENT:   Negative for lump/mass, mouth sores, nosebleeds, sore throat and trouble swallowing.   Eyes:  Negative for eye problems.  Respiratory:  Positive for shortness of breath. Negative for cough.   Cardiovascular:  Positive for leg swelling. Negative for chest pain and palpitations.  Gastrointestinal:  Negative for abdominal pain, constipation, diarrhea, nausea and vomiting.  Genitourinary:  Negative for bladder incontinence, difficulty urinating, dysuria, frequency, hematuria and nocturia.   Musculoskeletal:  Negative for arthralgias, back pain, flank pain, myalgias and neck pain.  Skin:  Negative for itching and rash.  Neurological:  Positive for headaches. Negative for dizziness and numbness.       +tingling hands and feet  Hematological:  Does not bruise/bleed easily.  Psychiatric/Behavioral:  Negative for depression, sleep disturbance and suicidal  ideas. The patient is nervous/anxious.   All other systems reviewed and are negative.    VITALS:   There were no vitals taken for this visit.  Wt Readings from Last 3 Encounters:  02/09/23 (!) 302 lb 4 oz (137.1 kg)  01/27/23 298 lb (135.2 kg)  12/08/22 283 lb 11.7 oz (128.7 kg)    There is no height or weight on file to calculate BMI.  Performance status (ECOG): 1 - Symptomatic but completely ambulatory  PHYSICAL EXAM:   Physical Exam Vitals and nursing note reviewed. Exam conducted with a chaperone present.  Constitutional:      Appearance: Normal appearance.  Cardiovascular:     Rate and Rhythm: Normal rate and regular rhythm.     Pulses: Normal pulses.     Heart sounds: Normal heart sounds.  Pulmonary:     Effort: Pulmonary effort is normal.     Breath sounds: Normal breath sounds.  Abdominal:     Palpations: Abdomen is soft. There is no hepatomegaly, splenomegaly or mass.     Tenderness: There is no abdominal tenderness.  Musculoskeletal:     Right lower leg: No edema.     Left lower leg: No edema.  Lymphadenopathy:     Cervical: No cervical adenopathy.     Right cervical: No superficial, deep or posterior cervical adenopathy.    Left cervical: No superficial, deep or posterior cervical adenopathy.     Upper Body:     Right upper body: No supraclavicular or axillary adenopathy.     Left upper body: No supraclavicular or axillary adenopathy.  Neurological:     General: No focal deficit present.     Mental Status: She is alert and oriented to person, place, and time.  Psychiatric:        Mood and Affect: Mood normal.        Behavior: Behavior normal.     LABS:      Latest Ref Rng & Units 02/09/2023    8:14 AM 02/02/2023    9:07 AM 01/26/2023    8:55 AM  CBC  WBC 4.0 - 10.5 K/uL 3.9  3.6  3.4   Hemoglobin 12.0 - 15.0 g/dL 65.7  84.6  96.2   Hematocrit 36.0 - 46.0 % 38.5  40.1  39.8   Platelets 150 - 400 K/uL 85  88  69       Latest Ref Rng & Units  02/09/2023    8:59 AM 01/26/2023    8:55 AM 01/12/2023   10:30 AM  CMP  Glucose 70 - 99 mg/dL 952  841  324   BUN 8 - 23 mg/dL 10  13  11    Creatinine 0.44 - 1.00 mg/dL 4.01  0.27  2.53   Sodium 135 - 145 mmol/L 137  138  138   Potassium 3.5 - 5.1 mmol/L 3.9  3.5  3.7   Chloride 98 - 111 mmol/L  102  98  98   CO2 22 - 32 mmol/L 30  29  30    Calcium 8.9 - 10.3 mg/dL 8.3  8.4  8.5   Total Protein 6.5 - 8.1 g/dL 5.6  6.1  6.2   Total Bilirubin <1.2 mg/dL 0.7  0.6  0.4   Alkaline Phos 38 - 126 U/L 102  104  110   AST 15 - 41 U/L 18  18  14    ALT 0 - 44 U/L 15  16  14       No results found for: "CEA1", "CEA" / No results found for: "CEA1", "CEA" No results found for: "PSA1" No results found for: "NFA213" No results found for: "CAN125"  Lab Results  Component Value Date   TOTALPROTELP 5.8 (L) 01/12/2023   ALBUMINELP 3.4 01/12/2023   A1GS 0.2 01/12/2023   A2GS 0.7 01/12/2023   BETS 0.8 01/12/2023   GAMS 0.6 01/12/2023   MSPIKE 0.3 (H) 01/12/2023   SPEI Comment 01/12/2023   Lab Results  Component Value Date   TIBC 285 02/09/2023   TIBC 323 01/12/2023   TIBC 331 10/26/2022   FERRITIN 578 (H) 02/09/2023   FERRITIN 441 (H) 01/12/2023   FERRITIN 156 10/26/2022   IRONPCTSAT 136 (H) 02/09/2023   IRONPCTSAT 28 01/12/2023   IRONPCTSAT 20 10/26/2022   Lab Results  Component Value Date   LDH 92 (L) 10/22/2021   LDH 101 06/25/2021   LDH 103 03/24/2021     STUDIES:   No results found.

## 2023-02-09 ENCOUNTER — Inpatient Hospital Stay: Payer: Medicare PPO | Admitting: Hematology

## 2023-02-09 ENCOUNTER — Inpatient Hospital Stay: Payer: Medicare PPO

## 2023-02-09 DIAGNOSIS — D500 Iron deficiency anemia secondary to blood loss (chronic): Secondary | ICD-10-CM

## 2023-02-09 DIAGNOSIS — R161 Splenomegaly, not elsewhere classified: Secondary | ICD-10-CM | POA: Diagnosis not present

## 2023-02-09 DIAGNOSIS — G629 Polyneuropathy, unspecified: Secondary | ICD-10-CM | POA: Diagnosis not present

## 2023-02-09 DIAGNOSIS — D696 Thrombocytopenia, unspecified: Secondary | ICD-10-CM | POA: Diagnosis not present

## 2023-02-09 DIAGNOSIS — E1142 Type 2 diabetes mellitus with diabetic polyneuropathy: Secondary | ICD-10-CM | POA: Diagnosis not present

## 2023-02-09 DIAGNOSIS — C9000 Multiple myeloma not having achieved remission: Secondary | ICD-10-CM | POA: Diagnosis not present

## 2023-02-09 DIAGNOSIS — K7460 Unspecified cirrhosis of liver: Secondary | ICD-10-CM | POA: Diagnosis not present

## 2023-02-09 DIAGNOSIS — M7989 Other specified soft tissue disorders: Secondary | ICD-10-CM | POA: Diagnosis not present

## 2023-02-09 DIAGNOSIS — Z5112 Encounter for antineoplastic immunotherapy: Secondary | ICD-10-CM | POA: Diagnosis not present

## 2023-02-09 DIAGNOSIS — Z95828 Presence of other vascular implants and grafts: Secondary | ICD-10-CM

## 2023-02-09 LAB — CBC WITH DIFFERENTIAL/PLATELET
Abs Immature Granulocytes: 0.01 10*3/uL (ref 0.00–0.07)
Basophils Absolute: 0 10*3/uL (ref 0.0–0.1)
Basophils Relative: 1 %
Eosinophils Absolute: 0.1 10*3/uL (ref 0.0–0.5)
Eosinophils Relative: 2 %
HCT: 38.5 % (ref 36.0–46.0)
Hemoglobin: 12.7 g/dL (ref 12.0–15.0)
Immature Granulocytes: 0 %
Lymphocytes Relative: 36 %
Lymphs Abs: 1.4 10*3/uL (ref 0.7–4.0)
MCH: 33.6 pg (ref 26.0–34.0)
MCHC: 33 g/dL (ref 30.0–36.0)
MCV: 101.9 fL — ABNORMAL HIGH (ref 80.0–100.0)
Monocytes Absolute: 0.5 10*3/uL (ref 0.1–1.0)
Monocytes Relative: 12 %
Neutro Abs: 2 10*3/uL (ref 1.7–7.7)
Neutrophils Relative %: 49 %
Platelets: 85 10*3/uL — ABNORMAL LOW (ref 150–400)
RBC: 3.78 MIL/uL — ABNORMAL LOW (ref 3.87–5.11)
RDW: 14.1 % (ref 11.5–15.5)
WBC: 3.9 10*3/uL — ABNORMAL LOW (ref 4.0–10.5)
nRBC: 0 % (ref 0.0–0.2)

## 2023-02-09 LAB — COMPREHENSIVE METABOLIC PANEL
ALT: 15 U/L (ref 0–44)
AST: 18 U/L (ref 15–41)
Albumin: 3.3 g/dL — ABNORMAL LOW (ref 3.5–5.0)
Alkaline Phosphatase: 102 U/L (ref 38–126)
Anion gap: 5 (ref 5–15)
BUN: 10 mg/dL (ref 8–23)
CO2: 30 mmol/L (ref 22–32)
Calcium: 8.3 mg/dL — ABNORMAL LOW (ref 8.9–10.3)
Chloride: 102 mmol/L (ref 98–111)
Creatinine, Ser: 0.51 mg/dL (ref 0.44–1.00)
GFR, Estimated: >60 mL/min (ref >60)
Glucose, Bld: 130 mg/dL — ABNORMAL HIGH (ref 70–99)
Potassium: 3.9 mmol/L (ref 3.5–5.1)
Sodium: 137 mmol/L (ref 135–145)
Total Bilirubin: 0.7 mg/dL (ref <1.2)
Total Protein: 5.6 g/dL — ABNORMAL LOW (ref 6.5–8.1)

## 2023-02-09 LAB — LIPID PANEL
Cholesterol: 164 mg/dL (ref 0–200)
HDL: 48 mg/dL (ref >40)
LDL Cholesterol: 96 mg/dL (ref 0–99)
Total CHOL/HDL Ratio: 3.4 {ratio}
Triglycerides: 102 mg/dL (ref <150)
VLDL: 20 mg/dL (ref 0–40)

## 2023-02-09 LAB — SAMPLE TO BLOOD BANK

## 2023-02-09 LAB — T4, FREE: Free T4: 0.86 ng/dL (ref 0.61–1.12)

## 2023-02-09 LAB — TSH: TSH: 2.686 u[IU]/mL (ref 0.350–4.500)

## 2023-02-09 LAB — IRON AND TIBC
Iron: 387 ug/dL — ABNORMAL HIGH (ref 28–170)
Saturation Ratios: 136 % — ABNORMAL HIGH (ref 10.4–31.8)
TIBC: 285 ug/dL (ref 250–450)

## 2023-02-09 LAB — FERRITIN: Ferritin: 578 ng/mL — ABNORMAL HIGH (ref 11–307)

## 2023-02-09 MED ORDER — CETIRIZINE HCL 10 MG PO TABS
10.0000 mg | ORAL_TABLET | Freq: Once | ORAL | Status: AC
  Administered 2023-02-09: 10 mg via ORAL
  Filled 2023-02-09: qty 1

## 2023-02-09 MED ORDER — DIPHENHYDRAMINE HCL 25 MG PO CAPS
50.0000 mg | ORAL_CAPSULE | Freq: Once | ORAL | Status: AC
  Administered 2023-02-09: 50 mg via ORAL
  Filled 2023-02-09: qty 2

## 2023-02-09 MED ORDER — SODIUM CHLORIDE 0.9 % IV SOLN: Freq: Once | INTRAVENOUS | Status: AC

## 2023-02-09 MED ORDER — HEPARIN SOD (PORK) LOCK FLUSH 100 UNIT/ML IV SOLN
500.0000 [IU] | Freq: Once | INTRAVENOUS | Status: AC
  Administered 2023-02-09: 500 [IU] via INTRAVENOUS

## 2023-02-09 MED ORDER — LORATADINE 10 MG PO TABS: 10.0000 mg | ORAL_TABLET | Freq: Once | ORAL | Status: DC

## 2023-02-09 MED ORDER — DEXAMETHASONE 4 MG PO TABS: ORAL_TABLET | ORAL | 3 refills | Status: DC

## 2023-02-09 MED ORDER — SODIUM CHLORIDE 0.9% FLUSH
10.0000 mL | INTRAVENOUS | Status: AC
  Administered 2023-02-09: 10 mL

## 2023-02-09 MED ORDER — ACETAMINOPHEN 325 MG PO TABS
650.0000 mg | ORAL_TABLET | Freq: Once | ORAL | Status: AC
  Administered 2023-02-09: 650 mg via ORAL
  Filled 2023-02-09: qty 2

## 2023-02-09 MED ORDER — DARATUMUMAB-HYALURONIDASE-FIHJ 1800-30000 MG-UT/15ML ~~LOC~~ SOLN
1800.0000 mg | Freq: Once | SUBCUTANEOUS | Status: AC
  Administered 2023-02-09: 1800 mg via SUBCUTANEOUS
  Filled 2023-02-09: qty 15

## 2023-02-09 MED ORDER — CYANOCOBALAMIN 1000 MCG/ML IJ SOLN
1000.0000 ug | Freq: Once | INTRAMUSCULAR | Status: AC
  Administered 2023-02-09: 1000 ug via INTRAMUSCULAR
  Filled 2023-02-09: qty 1

## 2023-02-09 MED ORDER — SODIUM CHLORIDE 0.9 % IV SOLN
500.0000 mg | Freq: Once | INTRAVENOUS | Status: AC
  Administered 2023-02-09: 500 mg via INTRAVENOUS
  Filled 2023-02-09: qty 400

## 2023-02-09 NOTE — Patient Instructions (Signed)
[facility] - A DEPT OF [name]  Discharge Instructions: Thank you for choosing [facility] to provide your oncology and hematology care.  If you have a lab appointment with the Cancer Center - please note that after April 8th, 2024, all labs will be drawn in the cancer center.  You do not have to check in or register with the main entrance as you have in the past but will complete your check-in in the cancer center.  Wear comfortable clothing and clothing appropriate for easy access to any Portacath or PICC line.   We strive to give you quality time with your provider. You may need to reschedule your appointment if you arrive late (15 or more minutes).  Arriving late affects you and other patients whose appointments are after yours.  Also, if you miss three or more appointments without notifying the office, you may be dismissed from the clinic at the provider's discretion.      For prescription refill requests, have your pharmacy contact our office and allow 72 hours for refills to be completed.    Today you received the following chemotherapy and/or immunotherapy agents Darzalex Faspro. Daratumumab; Hyaluronidase Injection What is this medication? DARATUMUMAB; HYALURONIDASE (dar a toom ue mab; hye al ur ON i dase) treats multiple myeloma, a type of bone marrow cancer. Daratumumab works by blocking a protein that causes cancer cells to grow and multiply. This helps to slow or stop the spread of cancer cells. Hyaluronidase works by increasing the absorption of other medications in the body to help them work better. This medication may also be used treat amyloidosis, a condition that causes the buildup of a protein (amyloid) in your body. It works by reducing the buildup of this protein, which decreases symptoms. It is a combination medication that contains a monoclonal antibody. This medicine may be used for other purposes; ask your health care  provider or pharmacist if you have questions. COMMON BRAND NAME(S): DARZALEX FASPRO What should I tell my care team before I take this medication? They need to know if you have any of these conditions: Heart disease Infection, such as chickenpox, cold sores, herpes, hepatitis B Lung or breathing disease An unusual or allergic reaction to daratumumab, hyaluronidase, other medications, foods, dyes, or preservatives Pregnant or trying to get pregnant Breast-feeding How should I use this medication? This medication is injected under the skin. It is given by your care team in a hospital or clinic setting. Talk to your care team about the use of this medication in children. Special care may be needed. Overdosage: If you think you have taken too much of this medicine contact a poison control center or emergency room at once. NOTE: This medicine is only for you. Do not share this medicine with others. What if I miss a dose? Keep appointments for follow-up doses. It is important not to miss your dose. Call your care team if you are unable to keep an appointment. What may interact with this medication? Interactions have not been studied. This list may not describe all possible interactions. Give your health care provider a list of all the medicines, herbs, non-prescription drugs, or dietary supplements you use. Also tell them if you smoke, drink alcohol, or use illegal drugs. Some items may interact with your medicine. What should I watch for while using this medication? Your condition will be monitored carefully while you are receiving this medication. This medication can cause serious allergic reactions.  To reduce your risk, your care team may give you other medication to take before receiving this one. Be sure to follow the directions from your care team. This medication can affect the results of blood tests to match your blood type. These changes can last for up to 6 months after the final dose.  Your care team will do blood tests to match your blood type before you start treatment. Tell all of your care team that you are being treated with this medication before receiving a blood transfusion. This medication can affect the results of some tests used to determine treatment response; extra tests may be needed to evaluate response. Talk to your care team if you wish to become pregnant or think you are pregnant. This medication can cause serious birth defects if taken during pregnancy and for 3 months after the last dose. A reliable form of contraception is recommended while taking this medication and for 3 months after the last dose. Talk to your care team about effective forms of contraception. Do not breast-feed while taking this medication. What side effects may I notice from receiving this medication? Side effects that you should report to your care team as soon as possible: Allergic reactions--skin rash, itching, hives, swelling of the face, lips, tongue, or throat Heart rhythm changes--fast or irregular heartbeat, dizziness, feeling faint or lightheaded, chest pain, trouble breathing Infection--fever, chills, cough, sore throat, wounds that don't heal, pain or trouble when passing urine, general feeling of discomfort or being unwell Infusion reactions--chest pain, shortness of breath or trouble breathing, feeling faint or lightheaded Sudden eye pain or change in vision such as blurry vision, seeing halos around lights, vision loss Unusual bruising or bleeding Side effects that usually do not require medical attention (report to your care team if they continue or are bothersome): Constipation Diarrhea Fatigue Nausea Pain, tingling, or numbness in the hands or feet Swelling of the ankles, hands, or feet This list may not describe all possible side effects. Call your doctor for medical advice about side effects. You may report side effects to FDA at 1-800-FDA-1088. Where should I keep my  medication? This medication is given in a hospital or clinic. It will not be stored at home. NOTE: This sheet is a summary. It may not cover all possible information. If you have questions about this medicine, talk to your doctor, pharmacist, or health care provider.  2024 Elsevier/Gold Standard (2021-07-20 00:00:00)       To help prevent nausea and vomiting after your treatment, we encourage you to take your nausea medication as directed.  BELOW ARE SYMPTOMS THAT SHOULD BE REPORTED IMMEDIATELY: *FEVER GREATER THAN 100.4 F (38 C) OR HIGHER *CHILLS OR SWEATING *NAUSEA AND VOMITING THAT IS NOT CONTROLLED WITH YOUR NAUSEA MEDICATION *UNUSUAL SHORTNESS OF BREATH *UNUSUAL BRUISING OR BLEEDING *URINARY PROBLEMS (pain or burning when urinating, or frequent urination) *BOWEL PROBLEMS (unusual diarrhea, constipation, pain near the anus) TENDERNESS IN MOUTH AND THROAT WITH OR WITHOUT PRESENCE OF ULCERS (sore throat, sores in mouth, or a toothache) UNUSUAL RASH, SWELLING OR PAIN  UNUSUAL VAGINAL DISCHARGE OR ITCHING   Items with * indicate a potential emergency and should be followed up as soon as possible or go to the Emergency Department if any problems should occur.  Please show the CHEMOTHERAPY ALERT CARD or IMMUNOTHERAPY ALERT CARD at check-in to the Emergency Department and triage nurse.  Should you have questions after your visit or need to cancel or reschedule your appointment, please contact [facility] - A DEPT OF [facility] [phone]  and follow the prompts.  Office hours are 8:00 a.m. to 4:30 p.m. Monday - Friday. Please note that voicemails left after 4:00 p.m. may not be returned until the following business day.  We are closed weekends and major holidays. You have access to a nurse at all times for urgent questions. Please call the main number to the clinic [phone] and follow the prompts.  For any non-urgent questions, you may also contact  your provider using MyChart. We now offer e-Visits for anyone 26 and older to request care online for non-urgent symptoms. For details visit [email or web address].   Also download the MyChart app! Go to the app store, search "MyChart", open the app, select [place], and log in with your MyChart username and password.

## 2023-02-09 NOTE — Progress Notes (Signed)
Patient presents today for Darzalex Faspro, B12 injection and Venofer iron infusion. Vital signs stable and within parameters for treatment. Patient has no complaints of any changes since her last treatment or side effects related to treatment or iron infusion.

## 2023-02-09 NOTE — Progress Notes (Signed)
Patient is taking Revlimid as prescribed.  She has not missed any doses and reports no side effects at this time.    Patient has been examined by Dr. Katragadda. Vital signs and labs have been reviewed by MD - ANC, Creatinine, LFTs, hemoglobin, and platelets are within treatment parameters per M.D. - pt may proceed with treatment.  Primary RN and pharmacy notified.  

## 2023-02-09 NOTE — Progress Notes (Signed)
Labs and vital signs within parameters for treatment.   Patient taking Revlimid as prescribed with no complaints of any side effects.   Darzalex Faspro and Venofer given today per MD orders. Tolerated infusion without adverse affects. Vital signs stable. No complaints at this time. Discharged from clinic ambulatory in stable condition. Alert and oriented x 3. F/U with [facility] as scheduled.

## 2023-02-09 NOTE — Patient Instructions (Signed)

## 2023-02-10 ENCOUNTER — Ambulatory Visit (HOSPITAL_COMMUNITY): Payer: Medicare PPO | Attending: Family Medicine | Admitting: Physical Therapy

## 2023-02-10 DIAGNOSIS — I890 Lymphedema, not elsewhere classified: Secondary | ICD-10-CM | POA: Insufficient documentation

## 2023-02-10 LAB — MISC LABCORP TEST (SEND OUT): Labcorp test code: 737856

## 2023-02-10 LAB — PTH, INTACT AND CALCIUM
Calcium, Total (PTH): 8.5 mg/dL — ABNORMAL LOW (ref 8.7–10.3)
PTH: 43 pg/mL (ref 15–65)

## 2023-02-13 ENCOUNTER — Other Ambulatory Visit: Payer: Self-pay

## 2023-02-13 ENCOUNTER — Ambulatory Visit (HOSPITAL_COMMUNITY): Payer: Medicare PPO | Admitting: Physical Therapy

## 2023-02-13 DIAGNOSIS — I890 Lymphedema, not elsewhere classified: Secondary | ICD-10-CM

## 2023-02-13 NOTE — Therapy (Signed)
OUTPATIENT PHYSICAL THERAPY LYMPHEDEMA EVALUATION  Patient Name: Savannah Burns MRN: [id number] DOB:09/27/1950, 72 y.o., female Today's Date: 02/13/2023  END OF SESSION:  PT End of Session - 02/13/23 1610     Visit Number 1    Number of Visits 18    Date for PT Re-Evaluation 04/07/23    Authorization Type [employer]    Progress Note Due on Visit 10    PT Start Time 1520    PT Stop Time 1610    PT Time Calculation (min) 50 min    Equipment Utilized During Treatment Oxygen    Activity Tolerance Patient tolerated treatment well    Behavior During Therapy WFL for tasks assessed/performed             Past Medical History:  Diagnosis Date   Adrenal adenoma, left    Stable   Anxiety    Arthritis    bilateral hands   Depression    Diabetes mellitus, type 2 (HCC) 08/12/2008   Qualifier: Diagnosis of  By: Dayton Martes MD, Talia     Dyspnea    Esophageal varices (HCC)    Grade II diastolic dysfunction    History of kidney stones    Hyperlipidemia    Hypertension    Lower back pain    Lower GI bleed 03/19/2020   Panic attacks    Pneumonia    currently taking antibiotic and prednisone for early stages of pneumonia   Pulmonary nodules    bilateral   Skin cancer    face   Past Surgical History:  Procedure Laterality Date   BIOPSY  04/07/2020   Procedure: BIOPSY;  Surgeon: Lanelle Bal, DO;  [facility];  Service: Endoscopy;;   Breast Cystectomy  Right    CESAREAN SECTION     COLONOSCOPY WITH PROPOFOL N/A 01/25/2020   Dr. Marletta Lor: Nonbleeding internal hemorrhoids, diverticulosis, 5 mm polyp removed from the ascending colon, 10 mm polyp removed from the sigmoid colon, 30 mm polyp (tubulovillous adenoma with no high-grade dysplasia) removed from the transverse colon via piecemeal status post tattoo.  Other polyps were tubular adenomas.  3 month surveillance colonoscopy recommended.   COLONOSCOPY WITH PROPOFOL N/A 04/07/2020   Procedure: COLONOSCOPY WITH PROPOFOL;   Surgeon: Lanelle Bal, DO;  [facility]: [facility];  Service: Endoscopy;  Laterality: N/A;  3:00pm, pt knows new time per office   CYSTOSCOPY/URETEROSCOPY/HOLMIUM LASER/STENT PLACEMENT Bilateral 03/01/2019   Procedure: CYSTOSCOPY/RETROGRADEURETEROSCOPY/HOLMIUM LASER/STENT PLACEMENT;  Surgeon: Rene Paci, MD;  Location: [facility];  Service: Urology;  Laterality: Bilateral;  ONLY NEEDS 60 MIN   ESOPHAGOGASTRODUODENOSCOPY (EGD) WITH PROPOFOL N/A 01/25/2020   Dr. Marletta Lor: 4 columns grade 1 esophageal varices   ESOPHAGOGASTRODUODENOSCOPY (EGD) WITH PROPOFOL N/A 05/18/2020   Procedure: ESOPHAGOGASTRODUODENOSCOPY (EGD) WITH PROPOFOL;  Surgeon: Lanelle Bal, DO;  [facility];  Service: Endoscopy;  Laterality: N/A;   ESOPHAGOGASTRODUODENOSCOPY (EGD) WITH PROPOFOL N/A 07/28/2020   Procedure: ESOPHAGOGASTRODUODENOSCOPY (EGD) WITH PROPOFOL;  Surgeon: Lanelle Bal, DO;  [facility];  Service: Endoscopy;  Laterality: N/A;  am or early PM due to givens capsule placement   GIVENS CAPSULE STUDY N/A 05/18/2020   Procedure: GIVENS CAPSULE STUDY;  Surgeon: Dolores Frame, MD;  [facility];  Service: Gastroenterology;  Laterality: N/A;   GIVENS CAPSULE STUDY N/A 07/28/2020   Procedure: GIVENS CAPSULE STUDY;  Surgeon: Lanelle Bal, DO;  [facility];  Service: Endoscopy;  Laterality: N/A;   IR IMAGING GUIDED  PORT INSERTION  12/02/2021   IRRIGATION AND DEBRIDEMENT ABSCESS Right 09/28/2022   Procedure: IRRIGATION AND DEBRIDEMENT ABSCESS;  Surgeon: Lewie Chamber, DO;  Location: [facility];  Service: General;  Laterality: Right;   POLYPECTOMY  01/25/2020   Procedure: POLYPECTOMY;  Surgeon: Lanelle Bal, DO;  [facility];  Service: Endoscopy;;   POLYPECTOMY  04/07/2020   Procedure: POLYPECTOMY INTESTINAL;  Surgeon: Lanelle Bal, DO;  [facility];  Service: Endoscopy;;   PORT-A-CATH REMOVAL Right 09/28/2022    Procedure: MINOR REMOVAL PORT-A-CATH;  Surgeon: Lewie Chamber, DO;  Location: [facility];  Service: General;  Laterality: Right;   SKIN CANCER EXCISION     Face   SPINE SURGERY     SUBMUCOSAL TATTOO INJECTION  01/25/2020   Procedure: SUBMUCOSAL TATTOO INJECTION;  Surgeon: Lanelle Bal, DO;  [facility];  Service: Endoscopy;;   Patient Active Problem List   Diagnosis Date Noted   Long term (current) use of antibiotics 10/25/2022   Gastroesophageal reflux disease without esophagitis 10/03/2022   Port or reservoir infection 09/28/2022   Axillary abscess 09/28/2022   Staphylococcus aureus bacteremia 09/27/2022   MSSA bacteremia 09/27/2022   Obesity, Class III, BMI 40-49.9 (morbid obesity) (HCC) 09/26/2022   Chronic respiratory failure with hypoxia (HCC) 09/26/2022   Thrombocytopenia (HCC) 09/26/2022   Cellulitis of axilla, right 09/26/2022   Diabetic peripheral neuropathy associated with type 2 diabetes mellitus (HCC) 02/25/2022   Multiple myeloma (HCC) 11/30/2021   H. pylori infection 08/12/2021   Cirrhosis of liver (HCC) 08/04/2021   Hypoalbuminemia 05/17/2020   Moderate protein malnutrition (HCC) 05/17/2020   Melena 05/16/2020   Hyperlipidemia    Hypokalemia    GI bleed 03/20/2020   Lower GI bleed 03/19/2020   Acute on chronic congestive heart failure (HCC)    Symptomatic anemia    Iron deficiency anemia    Heme positive stool    ABLA (acute blood loss anemia) 01/22/2020   Supplemental oxygen dependent 10/22/2019   Chronic dyspnea 10/22/2019   Hypertension associated with diabetes (HCC) 10/22/2019   Bilateral hand pain 09/21/2016   Fatigue 07/30/2015   Chronic diastolic HF (heart failure) (HCC) 06/29/2015   Postmenopausal bleeding 04/17/2015   Excessive daytime sleepiness 12/19/2014   Swelling of lower extremity 11/27/2014   Back pain 06/13/2013   Sinusitis, chronic 05/30/2012   Allergic rhinitis 06/06/2011   Hyperlipidemia associated with type 2  diabetes mellitus (HCC) 03/24/2009   Diabetes mellitus, type 2 (HCC) 08/12/2008   Pulmonary nodule 08/29/2007   COPD (chronic obstructive pulmonary disease) (HCC) 02/14/2007   Class 3 obesity 05/25/2006   Depression, recurrent (HCC) 05/25/2006   HYPERTENSION, BENIGN SYSTEMIC 05/25/2006   DJD, UNSPECIFIED 05/25/2006    PCP: Raliegh Ip, DO  REFERRING PROVIDER: Raliegh Ip, DO  REFERRING DIAG: I89.0 (ICD-10-CM) - Lymphedema ; 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  She has not hooked up her compression pump for even though she has had it for a month and a have.  She no longer has any wounds on her legs.  Pt states at times her legs weep so much she puts multiple pads on her legs her clothes still get wet.  She has had wounds on both her legs but currently is wound free.   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 Yes NPRS scale: 7/10 Pain location: Lt greater than RT  Pain orientation: Bilateral and Lower  PAIN TYPE: burning and throbbing Pain description: burning and aching  Aggravating factors: having them down  Relieving factors: elevation   PRECAUTIONS: Other: cellulitis    WEIGHT BEARING RESTRICTIONS: No  FALLS:  Has patient fallen in last 6 months? No  LIVING ENVIRONMENT: Lives with: lives with their family OCCUPATION:  retired   PATIENT GOALS: Wounds to heal    OBJECTIVE: Note: Objective measures were completed at Evaluation unless otherwise noted.  COGNITION: Overall cognitive status: Within functional limits for tasks assessed   PALPATION: Noted induration    LYMPHEDEMA ASSESSMENTS:    LE LANDMARK RIGHT eval  At groin   30 cm proximal to suprapatella   20 cm proximal to suprapatella   10 cm proximal to suprapatella 66.5  At midpatella / popliteal crease 50.8  30 cm proximal to floor at lateral plantar foot 55.6  20 cm proximal to floor at lateral plantar foot 49.7  10 cm proximal to floor at lateral plantar foot 33  Circumference of ankle/heel 37  5 cm proximal to 1st MTP joint 28.7  Across MTP joint 27  Around proximal great toe 10.6  (Blank rows = not tested)  LE LANDMARK LEFT eval  At groin   30 cm proximal to suprapatella   20 cm proximal to suprapatella   10 cm proximal to suprapatella 70.2  At midpatella / popliteal crease 56  30 cm proximal to floor at lateral plantar foot 58.3  20 cm proximal to floor at lateral plantar foot 46.2  10 cm proximal to floor at lateral plantar foot 34.2  Circumference of ankle/heel 37.9  5 cm proximal to 1st MTP joint 28.3  Across MTP joint 27.8  Around proximal great toe 10.8  (Blank rows = not tested)  FUNCTIONAL TESTS:  2 minute walk test : 116 ft   TODAY'S TREATMENT:                                                                                                                              DATE: 02/13/23   PATIENT EDUCATION:  Education details: lymphedema what it is  how it is controlled and that it is not curable  Person educated: Patient Education method: Explanation and Handouts Education comprehension: verbalized understanding  HOME EXERCISE PROGRAM: Ankle pumps, LAQ, hip ab/adduction, marching and diaphragm breathing   ASSESSMENT:  CLINICAL IMPRESSION: Patient is a 72 y.o. female  who was seen today for physical  therapy evaluation and treatment for evaluation and treatment for B lymphedema.  Pt has indurated LE with limited ROM due to the induration, her legs are red and warm at this time.  The pt will benefit from skilled physical therapy for total decongestive techniques to decrease her edema and educate her on how she can control her edema in the future.  PT has a compression pump from Dr. Lajoyce Corners.  .   OBJECTIVE IMPAIRMENTS: Abnormal gait, decreased activity tolerance, increased edema, pain, and decreased skin integrity. .   ACTIVITY LIMITATIONS: carrying, lifting, standing, bathing, dressing, hygiene/grooming, and locomotion level  PARTICIPATION LIMITATIONS: cleaning and community activity  PERSONAL FACTORS: Age, Fitness, Time since onset of injury/illness/exacerbation, and 3+ comorbidities: see above  are also affecting patient's functional outcome.   REHAB POTENTIAL: Good  CLINICAL DECISION MAKING: Evolving/moderate complexity  EVALUATION COMPLEXITY: Moderate  GOALS: Goals reviewed with patient? No  SHORT TERM GOALS: Target date: 03/17/23 starting after Thanksgiving  PT to have lost 2-4 cm of fluid from both of her LE at measured areas Baseline: Goal status: INITIAL  2.  . Baseline: PT to be using her compression pump Goal status: INITIAL  3.  Pt to be completing her HEP to improve lymphatic circulation Baseline:  Goal status: INITIAL     LONG TERM GOALS: Target date: 04/07/23  PT to have lost 4-6 cm of fluid from both of her LE at measured areas in thigh and 30/20 cm of LE .  Baseline:  Goal status: INITIAL  2.  PT to be able to don and doff her compression garment Baseline:  Goal status: INITIAL   PLAN:  PT FREQUENCY: 3x/week  PT DURATION: 6 weeks  PLANNED INTERVENTIONS: 97110-Therapeutic exercises, 97535- Self Care, Patient/Family education,97140- Manual lymph drainage, and Compression bandaging  PLAN FOR NEXT SESSION: Cut foam begin Total decongestive techniques.    Virgina Organ, PT CLT [phone]  02/13/2023, 4:19 PM   [employer] Auth Request  Referring diagnosis code (ICD 10)? 189 Treatment diagnosis codes (ICD 10)? (if different than referring diagnosis) 189 What was this (referring dx) caused by? []  Surgery []  Fall []  Ongoing issue []  Arthritis []  Other: ____________  Laterality: []  Rt []  Lt [x]  Both  Deficits: [x]  Pain [x]  Stiffness []  Weakness [x]  Edema []  Balance Deficits []  Coordination []  Gait Disturbance []  ROM []  Other   Functional Tool Score: 2 minute walk test 116 ft   CPT codes: See Planned Interventions listed in the Plan section of the Evaluation.

## 2023-02-14 LAB — HGB FRACTIONATION CASCADE
Hgb A2: 2.6 % (ref 1.8–3.2)
Hgb A: 97.4 % (ref 96.4–98.8)
Hgb F: 0 % (ref 0.0–2.0)
Hgb S: 0 %

## 2023-02-15 ENCOUNTER — Encounter (HOSPITAL_COMMUNITY): Payer: Medicare PPO

## 2023-02-16 ENCOUNTER — Inpatient Hospital Stay: Payer: Medicare PPO

## 2023-02-16 ENCOUNTER — Other Ambulatory Visit: Payer: Self-pay | Admitting: *Deleted

## 2023-02-16 DIAGNOSIS — C9000 Multiple myeloma not having achieved remission: Secondary | ICD-10-CM

## 2023-02-16 DIAGNOSIS — D696 Thrombocytopenia, unspecified: Secondary | ICD-10-CM | POA: Diagnosis not present

## 2023-02-16 DIAGNOSIS — M7989 Other specified soft tissue disorders: Secondary | ICD-10-CM | POA: Diagnosis not present

## 2023-02-16 DIAGNOSIS — R161 Splenomegaly, not elsewhere classified: Secondary | ICD-10-CM | POA: Diagnosis not present

## 2023-02-16 DIAGNOSIS — G629 Polyneuropathy, unspecified: Secondary | ICD-10-CM | POA: Diagnosis not present

## 2023-02-16 DIAGNOSIS — K7460 Unspecified cirrhosis of liver: Secondary | ICD-10-CM | POA: Diagnosis not present

## 2023-02-16 DIAGNOSIS — Z5112 Encounter for antineoplastic immunotherapy: Secondary | ICD-10-CM | POA: Diagnosis not present

## 2023-02-16 DIAGNOSIS — D500 Iron deficiency anemia secondary to blood loss (chronic): Secondary | ICD-10-CM | POA: Diagnosis not present

## 2023-02-16 DIAGNOSIS — E1142 Type 2 diabetes mellitus with diabetic polyneuropathy: Secondary | ICD-10-CM | POA: Diagnosis not present

## 2023-02-17 ENCOUNTER — Encounter (HOSPITAL_COMMUNITY): Payer: Medicare PPO | Admitting: Physical Therapy

## 2023-02-20 ENCOUNTER — Encounter (HOSPITAL_COMMUNITY): Payer: Medicare PPO | Admitting: Physical Therapy

## 2023-02-20 ENCOUNTER — Telehealth (HOSPITAL_COMMUNITY): Payer: Self-pay | Admitting: Occupational Therapy

## 2023-02-20 NOTE — Telephone Encounter (Signed)
Attempted to call pt regarding Patient Assistance Acknowledgement Form that she will need to sign to request [facility] to provide the supplies needed for lymphedema therapy, as pt is unable to afford the supplies. Unable to reach patient, however able to reach granddaughter, Tye Savoy, listed as emergency contact. Requested pt come by the clinic and sign the form as well as provide income information such as a 2023 W2. Granddaughter will relay information to patient.    Ezra Sites, OTR/L  [phone] 02/20/23

## 2023-02-22 ENCOUNTER — Inpatient Hospital Stay: Payer: Medicare PPO

## 2023-02-22 ENCOUNTER — Encounter (HOSPITAL_COMMUNITY): Payer: Medicare PPO | Admitting: Physical Therapy

## 2023-02-22 VITALS — BP 182/79 | HR 102 | Temp 98.0°F | Resp 20

## 2023-02-22 DIAGNOSIS — C9000 Multiple myeloma not having achieved remission: Secondary | ICD-10-CM

## 2023-02-22 DIAGNOSIS — D500 Iron deficiency anemia secondary to blood loss (chronic): Secondary | ICD-10-CM | POA: Diagnosis not present

## 2023-02-22 DIAGNOSIS — G629 Polyneuropathy, unspecified: Secondary | ICD-10-CM | POA: Diagnosis not present

## 2023-02-22 DIAGNOSIS — K7460 Unspecified cirrhosis of liver: Secondary | ICD-10-CM | POA: Diagnosis not present

## 2023-02-22 DIAGNOSIS — R161 Splenomegaly, not elsewhere classified: Secondary | ICD-10-CM | POA: Diagnosis not present

## 2023-02-22 DIAGNOSIS — D696 Thrombocytopenia, unspecified: Secondary | ICD-10-CM | POA: Diagnosis not present

## 2023-02-22 DIAGNOSIS — E1142 Type 2 diabetes mellitus with diabetic polyneuropathy: Secondary | ICD-10-CM | POA: Diagnosis not present

## 2023-02-22 DIAGNOSIS — Z5112 Encounter for antineoplastic immunotherapy: Secondary | ICD-10-CM | POA: Diagnosis not present

## 2023-02-22 DIAGNOSIS — M7989 Other specified soft tissue disorders: Secondary | ICD-10-CM | POA: Diagnosis not present

## 2023-02-22 MED ORDER — SODIUM CHLORIDE 0.9% FLUSH
10.0000 mL | INTRAVENOUS | Status: DC | PRN
  Administered 2023-02-22: 10 mL via INTRAVENOUS

## 2023-02-22 MED ORDER — HEPARIN SOD (PORK) LOCK FLUSH 100 UNIT/ML IV SOLN
500.0000 [IU] | Freq: Once | INTRAVENOUS | Status: AC
Start: 2023-02-22 — End: 2023-02-22
  Administered 2023-02-22: 500 [IU] via INTRAVENOUS

## 2023-02-22 NOTE — Patient Instructions (Signed)
 [facility] - A DEPT OF [facility]  Discharge Instructions: Thank you for choosing [facility] to provide your oncology and hematology care.  If you have a lab appointment with the Cancer Center - please note that after April 8th, 2024, all labs will be drawn in the cancer center.  You do not have to check in or register with the main entrance as you have in the past but will complete your check-in in the cancer center.  Wear comfortable clothing and clothing appropriate for easy access to any Portacath or PICC line.   We strive to give you quality time with your provider. You may need to reschedule your appointment if you arrive late (15 or more minutes).  Arriving late affects you and other patients whose appointments are after yours.  Also, if you miss three or more appointments without notifying the office, you may be dismissed from the clinic at the provider's discretion.      For prescription refill requests, have your pharmacy contact our office and allow 72 hours for refills to be completed.      To help prevent nausea and vomiting after your treatment, we encourage you to take your nausea medication as directed.  BELOW ARE SYMPTOMS THAT SHOULD BE REPORTED IMMEDIATELY: *FEVER GREATER THAN 100.4 F (38 C) OR HIGHER *CHILLS OR SWEATING *NAUSEA AND VOMITING THAT IS NOT CONTROLLED WITH YOUR NAUSEA MEDICATION *UNUSUAL SHORTNESS OF BREATH *UNUSUAL BRUISING OR BLEEDING *URINARY PROBLEMS (pain or burning when urinating, or frequent urination) *BOWEL PROBLEMS (unusual diarrhea, constipation, pain near the anus) TENDERNESS IN MOUTH AND THROAT WITH OR WITHOUT PRESENCE OF ULCERS (sore throat, sores in mouth, or a toothache) UNUSUAL RASH, SWELLING OR PAIN  UNUSUAL VAGINAL DISCHARGE OR ITCHING   Items with * indicate a potential emergency and should be followed up as soon as possible or go to the Emergency Department if any problems should  occur.  Please show the CHEMOTHERAPY ALERT CARD or IMMUNOTHERAPY ALERT CARD at check-in to the Emergency Department and triage nurse.  Should you have questions after your visit or need to cancel or reschedule your appointment, please contact [facility] - A DEPT OF [facility] [phone]  and follow the prompts.  Office hours are 8:00 a.m. to 4:30 p.m. Monday - Friday. Please note that voicemails left after 4:00 p.m. may not be returned until the following business day.  We are closed weekends and major holidays. You have access to a nurse at all times for urgent questions. Please call the main number to the clinic [phone] and follow the prompts.  For any non-urgent questions, you may also contact your provider using MyChart. We now offer e-Visits for anyone 93 and older to request care online for non-urgent symptoms. For details visit [email or web address].   Also download the MyChart app! Go to the app store, search "MyChart", open the app, select [place], and log in with your MyChart username and password.

## 2023-02-22 NOTE — Progress Notes (Signed)
Patients PICC flushed without difficulty.  Good blood return noted with no bruising or swelling noted at site.  PICC line dressing changed.  VSS with discharge and left in satisfactory condition with no s/s of distress noted.

## 2023-02-24 DIAGNOSIS — J449 Chronic obstructive pulmonary disease, unspecified: Secondary | ICD-10-CM | POA: Diagnosis not present

## 2023-02-27 ENCOUNTER — Encounter (HOSPITAL_COMMUNITY): Payer: Medicare PPO | Admitting: Physical Therapy

## 2023-02-27 ENCOUNTER — Telehealth (HOSPITAL_COMMUNITY): Payer: Self-pay | Admitting: Physical Therapy

## 2023-02-27 NOTE — Telephone Encounter (Signed)
First no show:  Pt states she had been vomiting all morning and fell asleep.   Plans on being here Wed. If she is better.    Virgina Organ, PT CLT [phone]

## 2023-02-28 ENCOUNTER — Ambulatory Visit: Payer: Medicare PPO | Admitting: Surgery

## 2023-02-28 ENCOUNTER — Telehealth: Payer: Self-pay | Admitting: Family Medicine

## 2023-03-01 ENCOUNTER — Inpatient Hospital Stay: Payer: Medicare PPO

## 2023-03-01 ENCOUNTER — Encounter (HOSPITAL_COMMUNITY): Payer: Medicare PPO

## 2023-03-01 ENCOUNTER — Inpatient Hospital Stay: Payer: Medicare PPO | Attending: Hematology

## 2023-03-01 ENCOUNTER — Ambulatory Visit: Payer: Self-pay | Admitting: Family Medicine

## 2023-03-01 ENCOUNTER — Telehealth (HOSPITAL_COMMUNITY): Payer: Self-pay

## 2023-03-01 DIAGNOSIS — E538 Deficiency of other specified B group vitamins: Secondary | ICD-10-CM | POA: Diagnosis not present

## 2023-03-01 DIAGNOSIS — C9000 Multiple myeloma not having achieved remission: Secondary | ICD-10-CM | POA: Diagnosis not present

## 2023-03-01 DIAGNOSIS — Z5112 Encounter for antineoplastic immunotherapy: Secondary | ICD-10-CM | POA: Insufficient documentation

## 2023-03-01 DIAGNOSIS — D509 Iron deficiency anemia, unspecified: Secondary | ICD-10-CM | POA: Diagnosis not present

## 2023-03-01 MED ORDER — SODIUM CHLORIDE 0.9% FLUSH
10.0000 mL | Freq: Once | INTRAVENOUS | Status: AC
  Administered 2023-03-01: 10 mL via INTRAVENOUS

## 2023-03-01 MED ORDER — HEPARIN SOD (PORK) LOCK FLUSH 100 UNIT/ML IV SOLN
500.0000 [IU] | Freq: Once | INTRAVENOUS | Status: AC
  Administered 2023-03-01: 500 [IU] via INTRAVENOUS

## 2023-03-01 NOTE — Progress Notes (Signed)
[place] G Skibinski presented for PICC flush and dressing change.  See IV assessment in docflowsheets for PICC details.  PICC located left arm.  Good blood return present. PICC flushed with 20ml NS and 250U Heparin, see MAR for further details.  [place] G Marinos tolerated procedure well and without incident.

## 2023-03-01 NOTE — Telephone Encounter (Signed)
2nd no show, called with no answer or voicemail available to leave message. Therapist cancelled all remaining apts except for next apt per no show policy.   Becky Sax, LPTA/CLT; CBIS [phone]  Juel Burrow, PTA 03/01/2023, 1:21 PM

## 2023-03-01 NOTE — Telephone Encounter (Signed)
Copied from CRM [id number]. Topic: Clinical - Red Word Triage >> Mar 01, 2023  3:50 PM Prudencio Pair wrote: Red Word that prompted transfer to Nurse Triage: Patient states she went to have PICC line changed today at hospital. They told her to see her PCP to get tested for covid, pneumonia, and flu. States she has been coughing so much and so badly until it hurts her lungs.   Chief Complaint: dry cough Symptoms: head congestion, runny nose, back pain due to coughing Frequency: ongoing for 3 days Pertinent Negatives: Patient denies fever Disposition: [] ED /[] Urgent Care (no appt availability in office) / [x] Appointment(In office/virtual)/ []  [place] [facility]/ [] Home Care/ [] Refused Recommended Disposition /[] [place] [facility]/ []  Follow-up with PCP Additional Notes: The patient reported that she went to an appointment for her picc line and was advised to follow up with her pcp to be tested for covid, pneumonia, and the flu due to her current cold symptoms.  She reported that she is going through treatment for cancer.  The patient denied having a fever.  The patient reported coughing spells that have caused her back to hurt and her lungs to hurt.  She was scheduled for an appointment within 24 hours.  Advised her to go to the ER if symptoms worsen.    Reason for Disposition  [1] Continuous (nonstop) coughing interferes with work or school AND [2] no improvement using cough treatment per Care Advice  Answer Assessment - Initial Assessment Questions 1. ONSET: "When did the cough begin?"      3 days ago  2. SEVERITY: "How bad is the cough today?"      Nonstop  3. SPUTUM: "Describe the color of your sputum" (none, dry cough; clear, white, yellow, green)     none 4. HEMOPTYSIS: "Are you coughing up any blood?" If so ask: "How much?" (flecks, streaks, tablespoons, etc.)     none 5. DIFFICULTY BREATHING: "Are you having difficulty breathing?" If Yes, ask: "How bad is it?" (e.g., mild, moderate,  severe)    - MILD: No SOB at rest, mild SOB with walking, speaks normally in sentences, can lie down, no retractions, pulse < 100.    - MODERATE: SOB at rest, SOB with minimal exertion and prefers to sit, cannot lie down flat, speaks in phrases, mild retractions, audible wheezing, pulse 100-120.    - SEVERE: Very SOB at rest, speaks in single words, struggling to breathe, sitting hunched forward, retractions, pulse > 120      On O2 all of the time 6. FEVER: "Do you have a fever?" If Yes, ask: "What is your temperature, how was it measured, and when did it start?"     None 7. CARDIAC HISTORY: "Do you have any history of heart disease?" (e.g., heart attack, congestive heart failure)      CHG 8. LUNG HISTORY: "Do you have any history of lung disease?"  (e.g., pulmonary embolus, asthma, emphysema)     COPD 9. PE RISK FACTORS: "Do you have a history of blood clots?" (or: recent major surgery, recent prolonged travel, bedridden)     None 10. OTHER SYMPTOMS: "Do you have any other symptoms?" (e.g., runny nose, wheezing, chest pain)       Runny nose, back pain due to coughing, head congestion Since they put the picc line in I've headaches; putting in a port December 10th  Supposed to do it yesterday but feeling so bad rescheduled  Protocols used: Cough - Acute Non-Productive-A-AH

## 2023-03-02 ENCOUNTER — Encounter: Payer: Self-pay | Admitting: Family Medicine

## 2023-03-02 ENCOUNTER — Ambulatory Visit: Payer: Medicare PPO | Admitting: Family Medicine

## 2023-03-02 VITALS — BP 141/76 | HR 62 | Temp 97.2°F | Ht 62.0 in | Wt 297.4 lb

## 2023-03-02 DIAGNOSIS — C9000 Multiple myeloma not having achieved remission: Secondary | ICD-10-CM

## 2023-03-02 DIAGNOSIS — J40 Bronchitis, not specified as acute or chronic: Secondary | ICD-10-CM

## 2023-03-02 DIAGNOSIS — J329 Chronic sinusitis, unspecified: Secondary | ICD-10-CM | POA: Diagnosis not present

## 2023-03-02 DIAGNOSIS — J069 Acute upper respiratory infection, unspecified: Secondary | ICD-10-CM | POA: Diagnosis not present

## 2023-03-02 DIAGNOSIS — I10 Essential (primary) hypertension: Secondary | ICD-10-CM

## 2023-03-02 LAB — VERITOR FLU A/B WAIVED
Influenza A: NEGATIVE
Influenza B: NEGATIVE

## 2023-03-02 MED ORDER — HYDROCODONE BIT-HOMATROP MBR 5-1.5 MG/5ML PO SOLN: 5.0000 mL | Freq: Four times a day (QID) | ORAL | 0 refills | Status: AC | PRN

## 2023-03-02 MED ORDER — MOXIFLOXACIN HCL 400 MG PO TABS: 400.0000 mg | ORAL_TABLET | Freq: Every day | ORAL | 0 refills | Status: DC

## 2023-03-02 NOTE — Progress Notes (Signed)
Subjective:  Patient ID: Savannah Burns, female    DOB: 01/20/1951  Age: 72 y.o. MRN: [id number]  CC: Cough   H[place] presents for deep cough X3 days with LGF and cpoious clear rhinorrhea. The cough is deep, frequent and dry. IT  causes a pain in her right upper back. She is under treatment for Multiple yeloma. She has a PICC line that is to b switched to a port, but this illness has prevented that. She is having a lot of myalgia. No relief with OTC meds. Has not done a covid test.     01/27/2023    9:02 AM 12/02/2022    3:32 PM 06/02/2022    9:19 AM  Depression screen PHQ 2/9  Decreased Interest 0 2 2  Down, Depressed, Hopeless 0 1 1  PHQ - 2 Score 0 3 3  Altered sleeping 0 1 0  Tired, decreased energy 0 1 2  Change in appetite 0 0 0  Feeling bad or failure about yourself  0 1 1  Trouble concentrating 0 1 1  Moving slowly or fidgety/restless 0 0 0  Suicidal thoughts 0 0 0  PHQ-9 Score 0 7 7  Difficult doing work/chores Not difficult at all Somewhat difficult Very difficult    History [place] has a past medical history of Adrenal adenoma, left, Anxiety, Arthritis, Depression, Diabetes mellitus, type 2 (HCC) (08/12/2008), Dyspnea, Esophageal varices (HCC), Grade II diastolic dysfunction, History of kidney stones, Hyperlipidemia, Hypertension, Lower back pain, Lower GI bleed (03/19/2020), Panic attacks, Pneumonia, Pulmonary nodules, and Skin cancer.   She has a past surgical history that includes Spine surgery; Cesarean section; Skin cancer excision; Breast Cystectomy  (Right); Cystoscopy/ureteroscopy/holmium laser/stent placement (Bilateral, 03/01/2019); Esophagogastroduodenoscopy (egd) with propofol (N/A, 01/25/2020); Colonoscopy with propofol (N/A, 01/25/2020); polypectomy (01/25/2020); Submucosal tattoo injection (01/25/2020); Colonoscopy with propofol (N/A, 04/07/2020); Polypectomy (04/07/2020); biopsy (04/07/2020); Givens capsule study (N/A, 05/18/2020);  Esophagogastroduodenoscopy (egd) with propofol (N/A, 05/18/2020); Esophagogastroduodenoscopy (egd) with propofol (N/A, 07/28/2020); Givens capsule study (N/A, 07/28/2020); IR IMAGING GUIDED PORT INSERTION (12/02/2021); Port-a-cath removal (Right, 09/28/2022); and Irrigation and debridement abscess (Right, 09/28/2022).   Her family history includes Anemia in her mother; COPD in her sister; Cancer (age of onset: 44) in her paternal grandmother; Diabetes in her father; Heart disease (age of onset: 43) in her father; Hypertension in her father.She reports that she quit smoking about 7 years ago. Her smoking use included cigarettes. She started smoking about 47 years ago. She has a 60 pack-year smoking history. She has never used smokeless tobacco. She reports that she does not drink alcohol and does not use drugs.    ROS Review of Systems  Constitutional:  Negative for activity change, appetite change, chills and fever.  HENT:  Positive for congestion, postnasal drip, rhinorrhea and sinus pressure. Negative for ear discharge, ear pain, hearing loss, nosebleeds, sneezing and trouble swallowing.   Respiratory:  Positive for cough. Negative for chest tightness and shortness of breath.   Cardiovascular:  Negative for chest pain and palpitations.  Skin:  Negative for rash.    Objective:  BP (!) 141/76   Pulse 62   Temp (!) 97.2 F (36.2 C) (Temporal)   Ht 5\' 2"  (1.575 m)   Wt 297 lb 6 oz (134.9 kg)   SpO2 98%   BMI 54.39 kg/m   BP Readings from Last 3 Encounters:  03/02/23 (!) 141/76  03/01/23 (!) 154/65  02/22/23 (!) 182/79    Wt Readings from Last 3 Encounters:  03/02/23 297 lb 6 oz (134.9 kg)  02/09/23 (!) 302 lb 4 oz (137.1 kg)  01/27/23 298 lb (135.2 kg)     Physical Exam Constitutional:      Appearance: She is well-developed.  HENT:     Head: Normocephalic and atraumatic.     Right Ear: Tympanic membrane and external ear normal. No decreased hearing noted.     Left Ear: Tympanic  membrane and external ear normal. No decreased hearing noted.     Nose: Mucosal edema present.     Right Sinus: No frontal sinus tenderness.     Left Sinus: No frontal sinus tenderness.     Mouth/Throat:     Pharynx: No oropharyngeal exudate or posterior oropharyngeal erythema.  Neck:     Meningeal: Brudzinski's sign absent.  Pulmonary:     Effort: No respiratory distress.     Breath sounds: Normal breath sounds.  Lymphadenopathy:     Head:     Right side of head: No preauricular adenopathy.     Left side of head: No preauricular adenopathy.     Cervical:     Right cervical: No superficial cervical adenopathy.    Left cervical: No superficial cervical adenopathy.      Assessment & Plan:   [place] was seen today for cough.  Diagnoses and all orders for this visit:  Viral URI -     Veritor Flu A/B Waived -     Novel Coronavirus, NAA ([facility])  Multiple myeloma not having achieved remission (HCC)  Sinobronchitis  HYPERTENSION, BENIGN SYSTEMIC  Other orders -     moxifloxacin (AVELOX) 400 MG tablet; Take 1 tablet (400 mg total) by mouth daily. -     HYDROcodone bit-homatropine (HYCODAN) 5-1.5 MG/5ML syrup; Take 5 mLs by mouth every 6 (six) hours as needed for up to 5 days for cough.       I am having [place] G. Mosley start on moxifloxacin and HYDROcodone bit-homatropine. I am also having her maintain her oxyCODONE-acetaminophen, lovastatin, Vitamin D, potassium chloride SA, OXYGEN, True Metrix Air Glucose Meter, True Metrix Blood Glucose Test, True Metrix Level 1, TRUEplus Lancets 33G, acyclovir, B-12 Compliance Injection, prochlorperazine, clobetasol, ferrous sulfate, baclofen, lidocaine-prilocaine, venlafaxine XR, torsemide, esomeprazole, mupirocin ointment, clindamycin, Ozempic (0.25 or 0.5 MG/DOSE), doxycycline, nystatin, lenalidomide, and dexamethasone.  Allergies as of 03/02/2023       Reactions   Keflex [cephalexin] Nausea And Vomiting        Medication  List        Accurate as of March 02, 2023 10:15 AM. If you have any questions, ask your nurse or doctor.          acyclovir 400 MG tablet Commonly known as: ZOVIRAX Take 1 tablet (400 mg total) by mouth 2 (two) times daily.   B-12 Compliance Injection 1000 MCG/ML Kit Generic drug: Cyanocobalamin Inject 1,000 mcg as directed every 30 (thirty) days.   baclofen 10 MG tablet Commonly known as: LIORESAL Take 10 mg by mouth 2 (two) times daily.   clindamycin 1 % Swab Commonly known as: CLEOCIN T Apply to affected areas under axilla twice daily   clobetasol 0.05 % external solution Commonly known as: TEMOVATE Apply 1 Application topically 2 (two) times daily.   dexamethasone 4 MG tablet Commonly known as: DECADRON Take 5 tablets once every 4 weeks prior to darzalex   doxycycline 100 MG tablet Commonly known as: VIBRA-TABS Take 1 tablet (100 mg total) by mouth 2 (two) times daily.   esomeprazole  40 MG capsule Commonly known as: NexIUM Take 1 capsule (40 mg total) by mouth daily.   ferrous sulfate 325 (65 FE) MG tablet Take 1 tablet (325 mg total) by mouth daily with breakfast.   HYDROcodone bit-homatropine 5-1.5 MG/5ML syrup Commonly known as: HYCODAN Take 5 mLs by mouth every 6 (six) hours as needed for up to 5 days for cough. Started by: Ansleigh Safer   lenalidomide 15 MG capsule Commonly known as: REVLIMID TAKE 1 CAPSULE BY MOUTH EVERY DAY FOR 21 DAYS ON THEN 7 DAYS OFF   lidocaine-prilocaine cream Commonly known as: EMLA Apply 1 Application topically as needed. Apply a small amount to port a cath site and cover with plastic wrap 1 hour prior to infusion appointments   lovastatin 20 MG tablet Commonly known as: MEVACOR Take 1 tablet (20 mg total) by mouth at bedtime.   moxifloxacin 400 MG tablet Commonly known as: AVELOX Take 1 tablet (400 mg total) by mouth daily. Started by: Shakara Tweedy   mupirocin ointment 2 % Commonly known as: BACTROBAN Apply 1  Application topically 2 (two) times daily.   nystatin powder Commonly known as: MYCOSTATIN/NYSTOP Apply topically 2 (two) times daily.   oxyCODONE-acetaminophen 10-325 MG tablet Commonly known as: PERCOCET Take 1 tablet by mouth every 6 (six) hours as needed for pain.   OXYGEN Inhale 3 L into the lungs continuous.   Ozempic (0.25 or 0.5 MG/DOSE) 2 MG/3ML Sopn Generic drug: Semaglutide(0.25 or 0.5MG /DOS) Inject 0.25 mg into the skin every 7 (seven) days.   potassium chloride SA 20 MEQ tablet Commonly known as: KLOR-CON M Take 2 tablets (40 mEq total) by mouth 2 (two) times daily. What changed: when to take this   prochlorperazine 10 MG tablet Commonly known as: COMPAZINE Take 1 tablet (10 mg total) by mouth every 6 (six) hours as needed for nausea or vomiting.   torsemide 20 MG tablet Commonly known as: DEMADEX Take 2.5 tablets (50 mg total) by mouth 2 (two) times daily.   True Metrix Air Glucose Meter w/Device Kit Check BS daily Dx E11.9   True Metrix Blood Glucose Test test strip Generic drug: glucose blood Check BS daily Dx E11.9   True Metrix Level 1 Low Soln Use w/ glucose monitor Dx E11.9   TRUEplus Lancets 33G Misc Check BS daily Dx E11.9   venlafaxine XR 150 MG 24 hr capsule Commonly known as: EFFEXOR-XR Take 1 capsule (150 mg total) by mouth daily with breakfast.   Vitamin D 50 MCG (2000 UT) tablet Take 2,000 Units by mouth daily.         Follow-up: Return if symptoms worsen or fail to improve.  Mechele Claude, M.D.

## 2023-03-03 ENCOUNTER — Encounter (HOSPITAL_COMMUNITY): Payer: Medicare PPO

## 2023-03-03 LAB — NOVEL CORONAVIRUS, NAA: SARS-CoV-2, NAA: DETECTED — AB

## 2023-03-05 ENCOUNTER — Other Ambulatory Visit: Payer: Self-pay | Admitting: Family Medicine

## 2023-03-05 MED ORDER — NIRMATRELVIR/RITONAVIR (PAXLOVID)TABLET: 3.0000 | ORAL_TABLET | Freq: Two times a day (BID) | ORAL | 0 refills | Status: AC

## 2023-03-06 ENCOUNTER — Encounter (HOSPITAL_COMMUNITY): Payer: Medicare PPO

## 2023-03-06 ENCOUNTER — Other Ambulatory Visit: Payer: Self-pay | Admitting: Hematology

## 2023-03-06 DIAGNOSIS — C9000 Multiple myeloma not having achieved remission: Secondary | ICD-10-CM

## 2023-03-07 ENCOUNTER — Ambulatory Visit: Payer: Medicare PPO | Admitting: Surgery

## 2023-03-07 DIAGNOSIS — Z0130 Encounter for examination of blood pressure without abnormal findings: Secondary | ICD-10-CM | POA: Diagnosis not present

## 2023-03-07 DIAGNOSIS — R051 Acute cough: Secondary | ICD-10-CM | POA: Diagnosis not present

## 2023-03-07 DIAGNOSIS — C9000 Multiple myeloma not having achieved remission: Secondary | ICD-10-CM | POA: Diagnosis not present

## 2023-03-07 DIAGNOSIS — I10 Essential (primary) hypertension: Secondary | ICD-10-CM | POA: Diagnosis not present

## 2023-03-07 DIAGNOSIS — M51369 Other intervertebral disc degeneration, lumbar region without mention of lumbar back pain or lower extremity pain: Secondary | ICD-10-CM | POA: Diagnosis not present

## 2023-03-07 DIAGNOSIS — U071 COVID-19: Secondary | ICD-10-CM | POA: Diagnosis not present

## 2023-03-07 DIAGNOSIS — Z6841 Body Mass Index (BMI) 40.0 and over, adult: Secondary | ICD-10-CM | POA: Diagnosis not present

## 2023-03-07 DIAGNOSIS — E119 Type 2 diabetes mellitus without complications: Secondary | ICD-10-CM | POA: Diagnosis not present

## 2023-03-07 DIAGNOSIS — F1120 Opioid dependence, uncomplicated: Secondary | ICD-10-CM | POA: Diagnosis not present

## 2023-03-07 NOTE — Telephone Encounter (Signed)
Chart reviewed. Revlimid refilled per last office note with Dr. Katragadda.  

## 2023-03-08 ENCOUNTER — Encounter (HOSPITAL_COMMUNITY): Payer: Medicare PPO

## 2023-03-09 ENCOUNTER — Inpatient Hospital Stay: Payer: Medicare PPO

## 2023-03-10 ENCOUNTER — Encounter (HOSPITAL_COMMUNITY): Payer: Medicare PPO | Admitting: Physical Therapy

## 2023-03-13 ENCOUNTER — Encounter (HOSPITAL_COMMUNITY): Payer: Medicare PPO | Admitting: Physical Therapy

## 2023-03-14 ENCOUNTER — Telehealth (HOSPITAL_COMMUNITY): Payer: Self-pay | Admitting: Physical Therapy

## 2023-03-14 NOTE — Telephone Encounter (Signed)
Pt has not returned since lymphedema evaluation and has no further appts scheduled.  Called both numbers given to inquire on status if she plans to return or needs to be discharged.  As it has been 1 month and we have not heard from patient, she will be discharged and placed back on the lymphedema waiting list.   Lurena Nida, PTA/CLT [facility] Ph: [phone]

## 2023-03-15 ENCOUNTER — Encounter (HOSPITAL_COMMUNITY): Payer: Medicare PPO

## 2023-03-16 ENCOUNTER — Inpatient Hospital Stay: Payer: Medicare PPO

## 2023-03-16 VITALS — BP 149/59 | HR 74 | Temp 98.9°F | Resp 20 | Wt 298.7 lb

## 2023-03-16 DIAGNOSIS — C9000 Multiple myeloma not having achieved remission: Secondary | ICD-10-CM

## 2023-03-16 DIAGNOSIS — D500 Iron deficiency anemia secondary to blood loss (chronic): Secondary | ICD-10-CM

## 2023-03-16 DIAGNOSIS — D509 Iron deficiency anemia, unspecified: Secondary | ICD-10-CM | POA: Diagnosis not present

## 2023-03-16 DIAGNOSIS — E538 Deficiency of other specified B group vitamins: Secondary | ICD-10-CM | POA: Diagnosis not present

## 2023-03-16 DIAGNOSIS — Z95828 Presence of other vascular implants and grafts: Secondary | ICD-10-CM

## 2023-03-16 DIAGNOSIS — Z5112 Encounter for antineoplastic immunotherapy: Secondary | ICD-10-CM | POA: Diagnosis not present

## 2023-03-16 LAB — COMPREHENSIVE METABOLIC PANEL
ALT: 25 U/L (ref 0–44)
AST: 25 U/L (ref 15–41)
Albumin: 3.3 g/dL — ABNORMAL LOW (ref 3.5–5.0)
Alkaline Phosphatase: 103 U/L (ref 38–126)
Anion gap: 8 (ref 5–15)
BUN: 14 mg/dL (ref 8–23)
CO2: 29 mmol/L (ref 22–32)
Calcium: 8.6 mg/dL — ABNORMAL LOW (ref 8.9–10.3)
Chloride: 100 mmol/L (ref 98–111)
Creatinine, Ser: 0.57 mg/dL (ref 0.44–1.00)
GFR, Estimated: >60 mL/min (ref >60)
Glucose, Bld: 179 mg/dL — ABNORMAL HIGH (ref 70–99)
Potassium: 3.8 mmol/L (ref 3.5–5.1)
Sodium: 137 mmol/L (ref 135–145)
Total Bilirubin: 0.5 mg/dL (ref <1.2)
Total Protein: 5.9 g/dL — ABNORMAL LOW (ref 6.5–8.1)

## 2023-03-16 LAB — CBC WITH DIFFERENTIAL/PLATELET
Abs Immature Granulocytes: 0.01 10*3/uL (ref 0.00–0.07)
Basophils Absolute: 0 10*3/uL (ref 0.0–0.1)
Basophils Relative: 1 %
Eosinophils Absolute: 0.1 10*3/uL (ref 0.0–0.5)
Eosinophils Relative: 2 %
HCT: 36.9 % (ref 36.0–46.0)
Hemoglobin: 12.3 g/dL (ref 12.0–15.0)
Immature Granulocytes: 0 %
Lymphocytes Relative: 36 %
Lymphs Abs: 1.2 10*3/uL (ref 0.7–4.0)
MCH: 34 pg (ref 26.0–34.0)
MCHC: 33.3 g/dL (ref 30.0–36.0)
MCV: 101.9 fL — ABNORMAL HIGH (ref 80.0–100.0)
Monocytes Absolute: 0.5 10*3/uL (ref 0.1–1.0)
Monocytes Relative: 15 %
Neutro Abs: 1.6 10*3/uL — ABNORMAL LOW (ref 1.7–7.7)
Neutrophils Relative %: 46 %
Platelets: 98 10*3/uL — ABNORMAL LOW (ref 150–400)
RBC: 3.62 MIL/uL — ABNORMAL LOW (ref 3.87–5.11)
RDW: 13.5 % (ref 11.5–15.5)
WBC: 3.4 10*3/uL — ABNORMAL LOW (ref 4.0–10.5)
nRBC: 0 % (ref 0.0–0.2)

## 2023-03-16 LAB — MAGNESIUM: Magnesium: 1.8 mg/dL (ref 1.7–2.4)

## 2023-03-16 MED ORDER — SODIUM CHLORIDE 0.9% FLUSH
10.0000 mL | Freq: Once | INTRAVENOUS | Status: AC | PRN
  Administered 2023-03-16: 10 mL

## 2023-03-16 MED ORDER — DARATUMUMAB-HYALURONIDASE-FIHJ 1800-30000 MG-UT/15ML ~~LOC~~ SOLN
1800.0000 mg | Freq: Once | SUBCUTANEOUS | Status: AC
Start: 2023-03-16 — End: 2023-03-16
  Administered 2023-03-16: 1800 mg via SUBCUTANEOUS
  Filled 2023-03-16: qty 15

## 2023-03-16 MED ORDER — DEXAMETHASONE 4 MG PO TABS
20.0000 mg | ORAL_TABLET | Freq: Once | ORAL | Status: AC
  Administered 2023-03-16: 20 mg via ORAL
  Filled 2023-03-16: qty 5

## 2023-03-16 MED ORDER — HEPARIN SOD (PORK) LOCK FLUSH 100 UNIT/ML IV SOLN
250.0000 [IU] | Freq: Once | INTRAVENOUS | Status: AC | PRN
  Administered 2023-03-16: 250 [IU]

## 2023-03-16 MED ORDER — ACETAMINOPHEN 325 MG PO TABS
650.0000 mg | ORAL_TABLET | Freq: Once | ORAL | Status: AC
  Administered 2023-03-16: 650 mg via ORAL
  Filled 2023-03-16: qty 2

## 2023-03-16 MED ORDER — CETIRIZINE HCL 10 MG PO TABS
10.0000 mg | ORAL_TABLET | Freq: Once | ORAL | Status: AC
  Administered 2023-03-16: 10 mg via ORAL
  Filled 2023-03-16: qty 1

## 2023-03-16 MED ORDER — SODIUM CHLORIDE 0.9 % IV SOLN
500.0000 mg | Freq: Once | INTRAVENOUS | Status: AC
Start: 2023-03-16 — End: 2023-03-16
  Administered 2023-03-16: 500 mg via INTRAVENOUS
  Filled 2023-03-16: qty 20

## 2023-03-16 MED ORDER — CYANOCOBALAMIN 1000 MCG/ML IJ SOLN
1000.0000 ug | Freq: Once | INTRAMUSCULAR | Status: AC
  Administered 2023-03-16: 1000 ug via INTRAMUSCULAR
  Filled 2023-03-16: qty 1

## 2023-03-16 MED ORDER — SODIUM CHLORIDE 0.9 % IV SOLN: Freq: Once | INTRAVENOUS | Status: AC

## 2023-03-16 NOTE — Patient Instructions (Signed)
[facility] - A DEPT OF [facility]  Discharge Instructions: Thank you for choosing [facility] to provide your oncology and hematology care.  If you have a lab appointment with the Cancer Center - please note that after April 8th, 2024, all labs will be drawn in the cancer center.  You do not have to check in or register with the main entrance as you have in the past but will complete your check-in in the cancer center.  Wear comfortable clothing and clothing appropriate for easy access to any Portacath or PICC line.   We strive to give you quality time with your provider. You may need to reschedule your appointment if you arrive late (15 or more minutes).  Arriving late affects you and other patients whose appointments are after yours.  Also, if you miss three or more appointments without notifying the office, you may be dismissed from the clinic at the provider's discretion.      For prescription refill requests, have your pharmacy contact our office and allow 72 hours for refills to be completed.    Today you received the following chemotherapy and/or immunotherapy agents Darzalex Faspro, Venofer IV iron, and B12 injection.   To help prevent nausea and vomiting after your treatment, we encourage you to take your nausea medication as directed.  BELOW ARE SYMPTOMS THAT SHOULD BE REPORTED IMMEDIATELY: *FEVER GREATER THAN 100.4 F (38 C) OR HIGHER *CHILLS OR SWEATING *NAUSEA AND VOMITING THAT IS NOT CONTROLLED WITH YOUR NAUSEA MEDICATION *UNUSUAL SHORTNESS OF BREATH *UNUSUAL BRUISING OR BLEEDING *URINARY PROBLEMS (pain or burning when urinating, or frequent urination) *BOWEL PROBLEMS (unusual diarrhea, constipation, pain near the anus) TENDERNESS IN MOUTH AND THROAT WITH OR WITHOUT PRESENCE OF ULCERS (sore throat, sores in mouth, or a toothache) UNUSUAL RASH, SWELLING OR PAIN  UNUSUAL VAGINAL DISCHARGE OR ITCHING   Items with * indicate a  potential emergency and should be followed up as soon as possible or go to the Emergency Department if any problems should occur.  Please show the CHEMOTHERAPY ALERT CARD or IMMUNOTHERAPY ALERT CARD at check-in to the Emergency Department and triage nurse.  Should you have questions after your visit or need to cancel or reschedule your appointment, please contact [facility] [place] - A DEPT OF [facility] [phone]  and follow the prompts.  Office hours are 8:00 a.m. to 4:30 p.m. Monday - Friday. Please note that voicemails left after 4:00 p.m. may not be returned until the following business day.  We are closed weekends and major holidays. You have access to a nurse at all times for urgent questions. Please call the main number to the clinic [phone] and follow the prompts.  For any non-urgent questions, you may also contact your provider using MyChart. We now offer e-Visits for anyone 35 and older to request care online for non-urgent symptoms. For details visit [email or web address].   Also download the MyChart app! Go to the app store, search "MyChart", open the app, select [place], and log in with your MyChart username and password.

## 2023-03-16 NOTE — Progress Notes (Signed)
Patient presents today for Darzalex Faspro, Venofer infusion, and B12 injection. Patient is in satisfactory condition with no new complaints voiced.  Vital signs are stable.  Labs reviewed and all labs are within treatment parameters.  We will proceed with treatment per MD orders.    [place] G Meckel presented for PICC line flush. Proper placement of PICC confirmed by CXR. PICC line located in the left antecubital. Clean, Dry, and Intact Good blood return present. PICC line flushed with 10ml NS and 250/39mL  Heparin per protocol. Procedure without incident. Patient tolerated procedure well.  Treatment given today per MD orders. Tolerated infusion without adverse affects. Vital signs stable. No complaints at this time. Discharged from clinic via wheelchair in stable condition. Alert and oriented x 3. F/U with [facility] as scheduled.

## 2023-03-17 ENCOUNTER — Encounter (HOSPITAL_COMMUNITY): Payer: Medicare PPO

## 2023-03-20 ENCOUNTER — Encounter (HOSPITAL_COMMUNITY): Payer: Medicare PPO | Admitting: Physical Therapy

## 2023-03-24 ENCOUNTER — Inpatient Hospital Stay: Payer: Medicare PPO

## 2023-03-24 ENCOUNTER — Encounter (HOSPITAL_COMMUNITY): Payer: Medicare PPO

## 2023-03-26 DIAGNOSIS — J449 Chronic obstructive pulmonary disease, unspecified: Secondary | ICD-10-CM | POA: Diagnosis not present

## 2023-03-27 ENCOUNTER — Encounter (HOSPITAL_COMMUNITY): Payer: Medicare PPO | Admitting: Physical Therapy

## 2023-03-27 ENCOUNTER — Encounter (HOSPITAL_COMMUNITY): Payer: Self-pay | Admitting: Physical Therapy

## 2023-03-27 NOTE — Therapy (Signed)
PHYSICAL THERAPY DISCHARGE SUMMARY  Visits from Start of Care: 1  Current functional level related to goals / functional outcomes: Unknown did not return    Remaining deficits: Unknown did not return   Education / Equipment: For aspects of treatment for lymphedema   Patient agrees to discharge. Patient goals were not met. Patient is being discharged due to not returning since the last visit. Virgina Organ, PT CLT [phone]

## 2023-03-30 ENCOUNTER — Ambulatory Visit: Payer: Medicare PPO | Admitting: Surgery

## 2023-03-31 ENCOUNTER — Telehealth: Payer: Self-pay | Admitting: Pharmacist

## 2023-03-31 ENCOUNTER — Encounter (HOSPITAL_COMMUNITY): Payer: Medicare PPO

## 2023-03-31 ENCOUNTER — Inpatient Hospital Stay: Payer: Medicare PPO | Attending: Hematology

## 2023-03-31 VITALS — BP 158/78 | HR 77 | Temp 98.0°F | Resp 20

## 2023-03-31 DIAGNOSIS — D509 Iron deficiency anemia, unspecified: Secondary | ICD-10-CM | POA: Diagnosis not present

## 2023-03-31 DIAGNOSIS — Z5112 Encounter for antineoplastic immunotherapy: Secondary | ICD-10-CM | POA: Diagnosis not present

## 2023-03-31 DIAGNOSIS — C9000 Multiple myeloma not having achieved remission: Secondary | ICD-10-CM | POA: Diagnosis not present

## 2023-03-31 DIAGNOSIS — D500 Iron deficiency anemia secondary to blood loss (chronic): Secondary | ICD-10-CM

## 2023-03-31 DIAGNOSIS — E538 Deficiency of other specified B group vitamins: Secondary | ICD-10-CM | POA: Diagnosis not present

## 2023-03-31 DIAGNOSIS — Z79899 Other long term (current) drug therapy: Secondary | ICD-10-CM | POA: Insufficient documentation

## 2023-03-31 MED ORDER — SODIUM CHLORIDE 0.9 % IV SOLN: Freq: Once | INTRAVENOUS | Status: AC

## 2023-03-31 MED ORDER — SODIUM CHLORIDE 0.9 % IV SOLN
500.0000 mg | Freq: Once | INTRAVENOUS | Status: AC
  Administered 2023-03-31: 500 mg via INTRAVENOUS
  Filled 2023-03-31: qty 25

## 2023-03-31 NOTE — Patient Instructions (Signed)
 [facility] - A DEPT OF [facility]  Discharge Instructions: Thank you for choosing [facility] to provide your oncology and hematology care.  If you have a lab appointment with the Cancer Center - please note that after April 8th, 2024, all labs will be drawn in the cancer center.  You do not have to check in or register with the main entrance as you have in the past but will complete your check-in in the cancer center.  Wear comfortable clothing and clothing appropriate for easy access to any Portacath or PICC line.   We strive to give you quality time with your provider. You may need to reschedule your appointment if you arrive late (15 or more minutes).  Arriving late affects you and other patients whose appointments are after yours.  Also, if you miss three or more appointments without notifying the office, you may be dismissed from the clinic at the provider's discretion.      For prescription refill requests, have your pharmacy contact our office and allow 72 hours for refills to be completed.    Today you received the following Venofer , return as scheduled.   To help prevent nausea and vomiting after your treatment, we encourage you to take your nausea medication as directed.  BELOW ARE SYMPTOMS THAT SHOULD BE REPORTED IMMEDIATELY: *FEVER GREATER THAN 100.4 F (38 C) OR HIGHER *CHILLS OR SWEATING *NAUSEA AND VOMITING THAT IS NOT CONTROLLED WITH YOUR NAUSEA MEDICATION *UNUSUAL SHORTNESS OF BREATH *UNUSUAL BRUISING OR BLEEDING *URINARY PROBLEMS (pain or burning when urinating, or frequent urination) *BOWEL PROBLEMS (unusual diarrhea, constipation, pain near the anus) TENDERNESS IN MOUTH AND THROAT WITH OR WITHOUT PRESENCE OF ULCERS (sore throat, sores in mouth, or a toothache) UNUSUAL RASH, SWELLING OR PAIN  UNUSUAL VAGINAL DISCHARGE OR ITCHING   Items with * indicate a potential emergency and should be followed up as soon as possible or  go to the Emergency Department if any problems should occur.  Please show the CHEMOTHERAPY ALERT CARD or IMMUNOTHERAPY ALERT CARD at check-in to the Emergency Department and triage nurse.  Should you have questions after your visit or need to cancel or reschedule your appointment, please contact [facility] [place] - A DEPT OF [facility] [phone]  and follow the prompts.  Office hours are 8:00 a.m. to 4:30 p.m. Monday - Friday. Please note that voicemails left after 4:00 p.m. may not be returned until the following business day.  We are closed weekends and major holidays. You have access to a nurse at all times for urgent questions. Please call the main number to the clinic [phone] and follow the prompts.  For any non-urgent questions, you may also contact your provider using MyChart. We now offer e-Visits for anyone 59 and older to request care online for non-urgent symptoms. For details visit [email or web address].   Also download the MyChart app! Go to the app store, search MyChart, open the app, select [facility], and log in with your MyChart username and password.

## 2023-03-31 NOTE — Progress Notes (Signed)
 Patient presents today for IV venofer , patient took own Zyrtec  at 51. Patient's PICC line flushes with no blood return noted, not pain at site per patient. PICC line looks misplaced. Patient is scheduled for Port placement on Monday. Delon Hope made aware, advised to pull PICC line today.  Patient tolerated PICC line removal with no complaints, patient stayed in lying position for 30 minutes, no bruising or swelling at site. Patient given education on how to take care of site at home.  Patient tolerated iron  infusion with no complaints voiced. Peripheral IV site clean and dry with good blood return noted before and after infusion. Band aid applied. VSS with discharge and left in satisfactory condition with no s/s of distress noted.

## 2023-03-31 NOTE — Telephone Encounter (Signed)
 Received notification from [facility] that they have not been able to reach patient to set up lenalidomide  medication delivery.  Attempted to reach patient at mobile and home number, unable to get a hold her. LVM on her mobile that [facility] was trying to get in contact with her.  If anyone from the office speak with her, please have her called [facility] to set-up lenalidomide  medication delivery.

## 2023-04-03 ENCOUNTER — Ambulatory Visit: Payer: Medicare PPO | Admitting: Surgery

## 2023-04-03 NOTE — Telephone Encounter (Signed)
 FYI

## 2023-04-05 ENCOUNTER — Encounter: Payer: Self-pay | Admitting: Surgery

## 2023-04-05 ENCOUNTER — Ambulatory Visit: Payer: Medicare PPO | Admitting: Surgery

## 2023-04-05 VITALS — BP 164/74 | HR 97 | Temp 98.2°F | Resp 18 | Ht 62.0 in | Wt 303.0 lb

## 2023-04-05 DIAGNOSIS — C9000 Multiple myeloma not having achieved remission: Secondary | ICD-10-CM

## 2023-04-06 ENCOUNTER — Inpatient Hospital Stay: Payer: Medicare PPO

## 2023-04-06 NOTE — Progress Notes (Signed)
 [facility] History and Physical  Reason for Referral: Port-A-Cath insertion Referring Physician: Dr. Rogers  Chief Complaint   New Patient (Initial Visit)     Savannah  SELEEN Burns is a 73 y.o. female.  HPI: Patient presents for evaluation of Port-A-Cath insertion.  Patient is known to me, as I removed her right IJ Port-A-Cath on 09/28/2022 for an MSSA bacteremia.  The Port-A-Cath was originally placed by IR in September 2023 for her to receive iron  infusions and treatment of her multiple myeloma.  Since removal of the Port-A-Cath, she had a PICC line in place, but it was removed as it was thought to be in an inappropriate position.  Her past medical history significant for diabetes on Ozempic  that she takes on Wednesday (has not taken yet today), hypertension, hyperlipidemia, COPD on 3 L nasal cannula, and multiple myeloma.  Her surgical history significant for right breast mass excision, right IJ Port-A-Cath insertion and removal, and C-section.  She denies use of blood thinning medications.  She denies tobacco use.  She also noted that she has had increased lightheadedness, weakness, and increased frequency of dark black bowel movements.  She does have a history of a GI bleed, and the bowel movements are similar in consistency to the last time that she experienced a GI bleed.  Vital signs are currently stable.  Past Medical History:  Diagnosis Date   Adrenal adenoma, left    Stable   Anxiety    Arthritis    bilateral hands   Depression    Diabetes mellitus, type 2 (HCC) 08/12/2008   Qualifier: Diagnosis of  By: Jenetta MD, Talia     Dyspnea    Esophageal varices (HCC)    Grade II diastolic dysfunction    History of kidney stones    Hyperlipidemia    Hypertension    Lower back pain    Lower GI bleed 03/19/2020   Panic attacks    Pneumonia    currently taking antibiotic and prednisone  for early stages of pneumonia   Pulmonary nodules    bilateral   Skin cancer     face    Past Surgical History:  Procedure Laterality Date   BIOPSY  04/07/2020   Procedure: BIOPSY;  Surgeon: Cindie Carlin POUR, DO;  [facility];  Service: Endoscopy;;   Breast Cystectomy  Right    CESAREAN SECTION     COLONOSCOPY WITH PROPOFOL  N/A 01/25/2020   Dr. Cindie: Nonbleeding internal hemorrhoids, diverticulosis, 5 mm polyp removed from the ascending colon, 10 mm polyp removed from the sigmoid colon, 30 mm polyp (tubulovillous adenoma with no high-grade dysplasia) removed from the transverse colon via piecemeal status post tattoo.  Other polyps were tubular adenomas.  3 month surveillance colonoscopy recommended.   COLONOSCOPY WITH PROPOFOL  N/A 04/07/2020   Procedure: COLONOSCOPY WITH PROPOFOL ;  Surgeon: Cindie Carlin POUR, DO;  [facility]: [facility];  Service: Endoscopy;  Laterality: N/A;  3:00pm, pt knows new time per office   CYSTOSCOPY/URETEROSCOPY/HOLMIUM LASER/STENT PLACEMENT Bilateral 03/01/2019   Procedure: CYSTOSCOPY/RETROGRADEURETEROSCOPY/HOLMIUM LASER/STENT PLACEMENT;  Surgeon: Devere Lonni Righter, MD;  Location: [facility];  Service: Urology;  Laterality: Bilateral;  ONLY NEEDS 60 MIN   ESOPHAGOGASTRODUODENOSCOPY (EGD) WITH PROPOFOL  N/A 01/25/2020   Dr. Cindie: 4 columns grade 1 esophageal varices   ESOPHAGOGASTRODUODENOSCOPY (EGD) WITH PROPOFOL  N/A 05/18/2020   Procedure: ESOPHAGOGASTRODUODENOSCOPY (EGD) WITH PROPOFOL ;  Surgeon: Cindie Carlin POUR, DO;  [facility];  Service: Endoscopy;  Laterality: N/A;   ESOPHAGOGASTRODUODENOSCOPY (EGD) WITH PROPOFOL   N/A 07/28/2020   Procedure: ESOPHAGOGASTRODUODENOSCOPY (EGD) WITH PROPOFOL ;  Surgeon: Cindie Carlin POUR, DO;  [facility];  Service: Endoscopy;  Laterality: N/A;  am or early PM due to givens capsule placement   GIVENS CAPSULE STUDY N/A 05/18/2020   Procedure: GIVENS CAPSULE STUDY;  Surgeon: Eartha Angelia Sieving, MD;  [facility];  Service: Gastroenterology;  Laterality: N/A;    GIVENS CAPSULE STUDY N/A 07/28/2020   Procedure: GIVENS CAPSULE STUDY;  Surgeon: Cindie Carlin POUR, DO;  [facility];  Service: Endoscopy;  Laterality: N/A;   IR IMAGING GUIDED PORT INSERTION  12/02/2021   IRRIGATION AND DEBRIDEMENT ABSCESS Right 09/28/2022   Procedure: IRRIGATION AND DEBRIDEMENT ABSCESS;  Surgeon: Evonnie Dorothyann LABOR, DO;  Location: [facility];  Service: General;  Laterality: Right;   POLYPECTOMY  01/25/2020   Procedure: POLYPECTOMY;  Surgeon: Cindie Carlin POUR, DO;  [facility];  Service: Endoscopy;;   POLYPECTOMY  04/07/2020   Procedure: POLYPECTOMY INTESTINAL;  Surgeon: Cindie Carlin POUR, DO;  [facility];  Service: Endoscopy;;   PORT-A-CATH REMOVAL Right 09/28/2022   Procedure: MINOR REMOVAL PORT-A-CATH;  Surgeon: Evonnie Dorothyann LABOR, DO;  Location: [facility];  Service: General;  Laterality: Right;   SKIN CANCER EXCISION     Face   SPINE SURGERY     SUBMUCOSAL TATTOO INJECTION  01/25/2020   Procedure: SUBMUCOSAL TATTOO INJECTION;  Surgeon: Cindie Carlin POUR, DO;  [facility];  Service: Endoscopy;;    Family History  Problem Relation Age of Onset   Diabetes Father    Heart disease Father 57       MI   Hypertension Father    Anemia Mother        Transfusion dependent   COPD Sister    Cancer Paternal Grandmother 57       Pancreatic    Social History   Tobacco Use   Smoking status: Former    Current packs/day: 0.00    Average packs/day: 1.5 packs/day for 40.0 years (60.0 ttl pk-yrs)    Types: Cigarettes    Start date: 04/29/1975    Quit date: 04/29/2015    Years since quitting: 7.9   Smokeless tobacco: Never   Tobacco comments:    Quit smoking 04/2015- Previous 1.5 ppd smoker  Vaping Use   Vaping status: Never Used  Substance Use Topics   Alcohol  use: No    Alcohol /week: 0.0 standard drinks of alcohol    Drug use: No    Medications: I have reviewed the patient's current medications. Allergies as of 04/05/2023        Reactions   Keflex  [cephalexin ] Nausea And Vomiting        Medication List        Accurate as of April 05, 2023 11:59 PM. If you have any questions, ask your nurse or doctor.          STOP taking these medications    doxycycline  100 MG tablet Commonly known as: VIBRA -TABS Stopped by: Austen Oyster A Aida Lemaire   moxifloxacin  400 MG tablet Commonly known as: AVELOX  Stopped by: Jaimin Krupka A Garen Woolbright       TAKE these medications    acyclovir  400 MG tablet Commonly known as: ZOVIRAX  Take 1 tablet (400 mg total) by mouth 2 (two) times daily.   B-12 Compliance Injection 1000 MCG/ML Kit Generic drug: Cyanocobalamin  Inject 1,000 mcg as directed every 30 (thirty) days.   baclofen 10 MG tablet Commonly known as: LIORESAL Take 10  mg by mouth 2 (two) times daily.   clindamycin  1 % Swab Commonly known as: CLEOCIN  T Apply to affected areas under axilla twice daily   clobetasol 0.05 % external solution Commonly known as: TEMOVATE Apply 1 Application topically 2 (two) times daily.   dexamethasone  4 MG tablet Commonly known as: DECADRON  Take 5 tablets once every 4 weeks prior to darzalex    esomeprazole  40 MG capsule Commonly known as: NexIUM  Take 1 capsule (40 mg total) by mouth daily.   ferrous sulfate  325 (65 FE) MG tablet Take 1 tablet (325 mg total) by mouth daily with breakfast.   lenalidomide  15 MG capsule Commonly known as: REVLIMID  TAKE 1 CAPSULE BY MOUTH EVERY DAY FOR 21 DAYS ON THEN 7 DAYS OFF   lidocaine -prilocaine  cream Commonly known as: EMLA  Apply 1 Application topically as needed. Apply a small amount to port a cath site and cover with plastic wrap 1 hour prior to infusion appointments   lovastatin  20 MG tablet Commonly known as: MEVACOR  Take 1 tablet (20 mg total) by mouth at bedtime.   mupirocin  ointment 2 % Commonly known as: BACTROBAN  Apply 1 Application topically 2 (two) times daily.   nystatin  powder Commonly known as:  MYCOSTATIN /NYSTOP  Apply topically 2 (two) times daily.   oxyCODONE -acetaminophen  10-325 MG tablet Commonly known as: PERCOCET Take 1 tablet by mouth every 6 (six) hours as needed for pain.   OXYGEN  Inhale 3 L into the lungs continuous.   Ozempic  (0.25 or 0.5 MG/DOSE) 2 MG/3ML Sopn Generic drug: Semaglutide (0.25 or 0.5MG /DOS) Inject 0.25 mg into the skin every 7 (seven) days.   potassium chloride  SA 20 MEQ tablet Commonly known as: KLOR-CON  M Take 2 tablets (40 mEq total) by mouth 2 (two) times daily. What changed: when to take this   prochlorperazine  10 MG tablet Commonly known as: COMPAZINE  Take 1 tablet (10 mg total) by mouth every 6 (six) hours as needed for nausea or vomiting.   torsemide  20 MG tablet Commonly known as: DEMADEX  Take 2.5 tablets (50 mg total) by mouth 2 (two) times daily.   True Metrix Air Glucose Meter w/Device Kit Check BS daily Dx E11.9   True Metrix Blood Glucose Test test strip Generic drug: glucose blood Check BS daily Dx E11.9   True Metrix Level 1 Low Soln Use w/ glucose monitor Dx E11.9   TRUEplus Lancets 33G Misc Check BS daily Dx E11.9   venlafaxine  XR 150 MG 24 hr capsule Commonly known as: EFFEXOR -XR Take 1 capsule (150 mg total) by mouth daily with breakfast.   Vitamin D  50 MCG (2000 UT) tablet Take 2,000 Units by mouth daily.         ROS:  Pertinent items are noted in HPI.  Blood pressure (!) 164/74, pulse 97, temperature 98.2 F (36.8 C), temperature source Oral, resp. rate 18, height 5' 2 (1.575 m), weight (!) 303 lb (137.4 kg), SpO2 90%. Physical Exam Vitals reviewed.  Constitutional:      Appearance: Normal appearance.  HENT:     Head: Normocephalic and atraumatic.  Eyes:     Extraocular Movements: Extraocular movements intact.     Pupils: Pupils are equal, round, and reactive to light.  Cardiovascular:     Rate and Rhythm: Normal rate and regular rhythm.  Pulmonary:     Effort: Pulmonary effort is normal.      Breath sounds: Normal breath sounds.  Abdominal:     General: There is no distension.     Palpations: Abdomen is soft.  Tenderness: There is no abdominal tenderness.  Musculoskeletal:        General: Normal range of motion.     Cervical back: Normal range of motion.  Skin:    General: Skin is warm and dry.  Neurological:     General: No focal deficit present.     Mental Status: She is alert and oriented to person, place, and time.  Psychiatric:        Mood and Affect: Mood normal.        Behavior: Behavior normal.     Results: No results found. However, due to the size of the patient record, not all encounters were searched. Please check Results Review for a complete set of results.  No results found.   Assessment & Plan:  Savannah  COURTNEY Burns is a 73 y.o. female who presents for evaluation of Port-A-Cath insertion.  -We discussed need for replacement of her Port-A-Cath given her significant difficulty with IV access and no longer in place PICC line -The risk and benefits of right IJ Port-A-Cath insertion, possible left were discussed including but not limited to bleeding, infection, injury to surrounding structures, pneumothorax, and need for additional procedures.  After careful consideration, Savannah Burns has decided to proceed with surgery.  -Patient tentatively scheduled for surgery on 1/15 -Advised that she needs to hold her Ozempic  dose today -Information provided to the patient regarding Port-A-Cath insertion -Also advised that the patient should present to the emergency department for evaluation of a possible GI bleed given her increased frequency and dark black bowel movements, lightheadedness, and dizziness.  Her vital signs are stable at this time.  All questions were answered to the satisfaction of the patient and family.  Dorothyann Brittle, DO [facility] [address] [place], [place] [address] [phone]  (office)

## 2023-04-06 NOTE — H&P (Signed)
 [facility] History and Physical  Reason for Referral: Port-A-Cath insertion Referring Physician: Dr. Rogers  Chief Complaint   New Patient (Initial Visit)     Savannah  SELEEN Burns is a 73 y.o. female.  HPI: Patient presents for evaluation of Port-A-Cath insertion.  Patient is known to me, as I removed her right IJ Port-A-Cath on 09/28/2022 for an MSSA bacteremia.  The Port-A-Cath was originally placed by IR in September 2023 for her to receive iron  infusions and treatment of her multiple myeloma.  Since removal of the Port-A-Cath, she had a PICC line in place, but it was removed as it was thought to be in an inappropriate position.  Her past medical history significant for diabetes on Ozempic  that she takes on Wednesday (has not taken yet today), hypertension, hyperlipidemia, COPD on 3 L nasal cannula, and multiple myeloma.  Her surgical history significant for right breast mass excision, right IJ Port-A-Cath insertion and removal, and C-section.  She denies use of blood thinning medications.  She denies tobacco use.  She also noted that she has had increased lightheadedness, weakness, and increased frequency of dark black bowel movements.  She does have a history of a GI bleed, and the bowel movements are similar in consistency to the last time that she experienced a GI bleed.  Vital signs are currently stable.  Past Medical History:  Diagnosis Date   Adrenal adenoma, left    Stable   Anxiety    Arthritis    bilateral hands   Depression    Diabetes mellitus, type 2 (HCC) 08/12/2008   Qualifier: Diagnosis of  By: Jenetta MD, Talia     Dyspnea    Esophageal varices (HCC)    Grade II diastolic dysfunction    History of kidney stones    Hyperlipidemia    Hypertension    Lower back pain    Lower GI bleed 03/19/2020   Panic attacks    Pneumonia    currently taking antibiotic and prednisone  for early stages of pneumonia   Pulmonary nodules    bilateral   Skin cancer     face    Past Surgical History:  Procedure Laterality Date   BIOPSY  04/07/2020   Procedure: BIOPSY;  Surgeon: Cindie Carlin POUR, DO;  [facility];  Service: Endoscopy;;   Breast Cystectomy  Right    CESAREAN SECTION     COLONOSCOPY WITH PROPOFOL  N/A 01/25/2020   Dr. Cindie: Nonbleeding internal hemorrhoids, diverticulosis, 5 mm polyp removed from the ascending colon, 10 mm polyp removed from the sigmoid colon, 30 mm polyp (tubulovillous adenoma with no high-grade dysplasia) removed from the transverse colon via piecemeal status post tattoo.  Other polyps were tubular adenomas.  3 month surveillance colonoscopy recommended.   COLONOSCOPY WITH PROPOFOL  N/A 04/07/2020   Procedure: COLONOSCOPY WITH PROPOFOL ;  Surgeon: Cindie Carlin POUR, DO;  [facility];  Service: Endoscopy;  Laterality: N/A;  3:00pm, pt knows new time per office   CYSTOSCOPY/URETEROSCOPY/HOLMIUM LASER/STENT PLACEMENT Bilateral 03/01/2019   Procedure: CYSTOSCOPY/RETROGRADEURETEROSCOPY/HOLMIUM LASER/STENT PLACEMENT;  Surgeon: Devere Lonni Righter, MD;  Location: [facility];  Service: Urology;  Laterality: Bilateral;  ONLY NEEDS 60 MIN   ESOPHAGOGASTRODUODENOSCOPY (EGD) WITH PROPOFOL  N/A 01/25/2020   Dr. Cindie: 4 columns grade 1 esophageal varices   ESOPHAGOGASTRODUODENOSCOPY (EGD) WITH PROPOFOL  N/A 05/18/2020   Procedure: ESOPHAGOGASTRODUODENOSCOPY (EGD) WITH PROPOFOL ;  Surgeon: Cindie Carlin POUR, DO;  [facility];  Service: Endoscopy;  Laterality: N/A;   ESOPHAGOGASTRODUODENOSCOPY (EGD) WITH PROPOFOL   N/A 07/28/2020   Procedure: ESOPHAGOGASTRODUODENOSCOPY (EGD) WITH PROPOFOL ;  Surgeon: Cindie Carlin POUR, DO;  [facility];  Service: Endoscopy;  Laterality: N/A;  am or early PM due to givens capsule placement   GIVENS CAPSULE STUDY N/A 05/18/2020   Procedure: GIVENS CAPSULE STUDY;  Surgeon: Eartha Angelia Sieving, MD;  [facility];  Service: Gastroenterology;  Laterality: N/A;    GIVENS CAPSULE STUDY N/A 07/28/2020   Procedure: GIVENS CAPSULE STUDY;  Surgeon: Cindie Carlin POUR, DO;  [facility];  Service: Endoscopy;  Laterality: N/A;   IR IMAGING GUIDED PORT INSERTION  12/02/2021   IRRIGATION AND DEBRIDEMENT ABSCESS Right 09/28/2022   Procedure: IRRIGATION AND DEBRIDEMENT ABSCESS;  Surgeon: Evonnie Dorothyann LABOR, DO;  Location: [facility];  Service: General;  Laterality: Right;   POLYPECTOMY  01/25/2020   Procedure: POLYPECTOMY;  Surgeon: Cindie Carlin POUR, DO;  [facility];  Service: Endoscopy;;   POLYPECTOMY  04/07/2020   Procedure: POLYPECTOMY INTESTINAL;  Surgeon: Cindie Carlin POUR, DO;  [facility];  Service: Endoscopy;;   PORT-A-CATH REMOVAL Right 09/28/2022   Procedure: MINOR REMOVAL PORT-A-CATH;  Surgeon: Evonnie Dorothyann LABOR, DO;  Location: [facility];  Service: General;  Laterality: Right;   SKIN CANCER EXCISION     Face   SPINE SURGERY     SUBMUCOSAL TATTOO INJECTION  01/25/2020   Procedure: SUBMUCOSAL TATTOO INJECTION;  Surgeon: Cindie Carlin POUR, DO;  [facility];  Service: Endoscopy;;    Family History  Problem Relation Age of Onset   Diabetes Father    Heart disease Father 57       MI   Hypertension Father    Anemia Mother        Transfusion dependent   COPD Sister    Cancer Paternal Grandmother 57       Pancreatic    Social History   Tobacco Use   Smoking status: Former    Current packs/day: 0.00    Average packs/day: 1.5 packs/day for 40.0 years (60.0 ttl pk-yrs)    Types: Cigarettes    Start date: 04/29/1975    Quit date: 04/29/2015    Years since quitting: 7.9   Smokeless tobacco: Never   Tobacco comments:    Quit smoking 04/2015- Previous 1.5 ppd smoker  Vaping Use   Vaping status: Never Used  Substance Use Topics   Alcohol  use: No    Alcohol /week: 0.0 standard drinks of alcohol    Drug use: No    Medications: I have reviewed the patient's current medications. Allergies as of 04/05/2023        Reactions   Keflex  [cephalexin ] Nausea And Vomiting        Medication List        Accurate as of April 05, 2023 11:59 PM. If you have any questions, ask your nurse or doctor.          STOP taking these medications    doxycycline  100 MG tablet Commonly known as: VIBRA -TABS Stopped by: Austen Oyster A Aida Lemaire   moxifloxacin  400 MG tablet Commonly known as: AVELOX  Stopped by: Jaimin Krupka A Garen Woolbright       TAKE these medications    acyclovir  400 MG tablet Commonly known as: ZOVIRAX  Take 1 tablet (400 mg total) by mouth 2 (two) times daily.   B-12 Compliance Injection 1000 MCG/ML Kit Generic drug: Cyanocobalamin  Inject 1,000 mcg as directed every 30 (thirty) days.   baclofen 10 MG tablet Commonly known as: LIORESAL Take 10  mg by mouth 2 (two) times daily.   clindamycin  1 % Swab Commonly known as: CLEOCIN  T Apply to affected areas under axilla twice daily   clobetasol 0.05 % external solution Commonly known as: TEMOVATE Apply 1 Application topically 2 (two) times daily.   dexamethasone  4 MG tablet Commonly known as: DECADRON  Take 5 tablets once every 4 weeks prior to darzalex    esomeprazole  40 MG capsule Commonly known as: NexIUM  Take 1 capsule (40 mg total) by mouth daily.   ferrous sulfate  325 (65 FE) MG tablet Take 1 tablet (325 mg total) by mouth daily with breakfast.   lenalidomide  15 MG capsule Commonly known as: REVLIMID  TAKE 1 CAPSULE BY MOUTH EVERY DAY FOR 21 DAYS ON THEN 7 DAYS OFF   lidocaine -prilocaine  cream Commonly known as: EMLA  Apply 1 Application topically as needed. Apply a small amount to port a cath site and cover with plastic wrap 1 hour prior to infusion appointments   lovastatin  20 MG tablet Commonly known as: MEVACOR  Take 1 tablet (20 mg total) by mouth at bedtime.   mupirocin  ointment 2 % Commonly known as: BACTROBAN  Apply 1 Application topically 2 (two) times daily.   nystatin  powder Commonly known as:  MYCOSTATIN /NYSTOP  Apply topically 2 (two) times daily.   oxyCODONE -acetaminophen  10-325 MG tablet Commonly known as: PERCOCET Take 1 tablet by mouth every 6 (six) hours as needed for pain.   OXYGEN  Inhale 3 L into the lungs continuous.   Ozempic  (0.25 or 0.5 MG/DOSE) 2 MG/3ML Sopn Generic drug: Semaglutide (0.25 or 0.5MG /DOS) Inject 0.25 mg into the skin every 7 (seven) days.   potassium chloride  SA 20 MEQ tablet Commonly known as: KLOR-CON  M Take 2 tablets (40 mEq total) by mouth 2 (two) times daily. What changed: when to take this   prochlorperazine  10 MG tablet Commonly known as: COMPAZINE  Take 1 tablet (10 mg total) by mouth every 6 (six) hours as needed for nausea or vomiting.   torsemide  20 MG tablet Commonly known as: DEMADEX  Take 2.5 tablets (50 mg total) by mouth 2 (two) times daily.   True Metrix Air Glucose Meter w/Device Kit Check BS daily Dx E11.9   True Metrix Blood Glucose Test test strip Generic drug: glucose blood Check BS daily Dx E11.9   True Metrix Level 1 Low Soln Use w/ glucose monitor Dx E11.9   TRUEplus Lancets 33G Misc Check BS daily Dx E11.9   venlafaxine  XR 150 MG 24 hr capsule Commonly known as: EFFEXOR -XR Take 1 capsule (150 mg total) by mouth daily with breakfast.   Vitamin D  50 MCG (2000 UT) tablet Take 2,000 Units by mouth daily.         ROS:  Pertinent items are noted in HPI.  Blood pressure (!) 164/74, pulse 97, temperature 98.2 F (36.8 C), temperature source Oral, resp. rate 18, height 5' 2 (1.575 m), weight (!) 303 lb (137.4 kg), SpO2 90%. Physical Exam Vitals reviewed.  Constitutional:      Appearance: Normal appearance.  HENT:     Head: Normocephalic and atraumatic.  Eyes:     Extraocular Movements: Extraocular movements intact.     Pupils: Pupils are equal, round, and reactive to light.  Cardiovascular:     Rate and Rhythm: Normal rate and regular rhythm.  Pulmonary:     Effort: Pulmonary effort is normal.      Breath sounds: Normal breath sounds.  Abdominal:     General: There is no distension.     Palpations: Abdomen is soft.  Tenderness: There is no abdominal tenderness.  Musculoskeletal:        General: Normal range of motion.     Cervical back: Normal range of motion.  Skin:    General: Skin is warm and dry.  Neurological:     General: No focal deficit present.     Mental Status: She is alert and oriented to person, place, and time.  Psychiatric:        Mood and Affect: Mood normal.        Behavior: Behavior normal.     Results: No results found. However, due to the size of the patient record, not all encounters were searched. Please check Results Review for a complete set of results.  No results found.   Assessment & Plan:  Savannah  COURTNEY Burns is a 73 y.o. female who presents for evaluation of Port-A-Cath insertion.  -We discussed need for replacement of her Port-A-Cath given her significant difficulty with IV access and no longer in place PICC line -The risk and benefits of right IJ Port-A-Cath insertion, possible left were discussed including but not limited to bleeding, infection, injury to surrounding structures, pneumothorax, and need for additional procedures.  After careful consideration, Mlissa  G Gully has decided to proceed with surgery.  -Patient tentatively scheduled for surgery on 1/15 -Advised that she needs to hold her Ozempic  dose today -Information provided to the patient regarding Port-A-Cath insertion -Also advised that the patient should present to the emergency department for evaluation of a possible GI bleed given her increased frequency and dark black bowel movements, lightheadedness, and dizziness.  Her vital signs are stable at this time.  All questions were answered to the satisfaction of the patient and family.  Dorothyann Brittle, DO [facility] [address] [place], [place] [address] [phone]  (office)

## 2023-04-07 ENCOUNTER — Other Ambulatory Visit: Payer: Self-pay

## 2023-04-07 DIAGNOSIS — C9000 Multiple myeloma not having achieved remission: Secondary | ICD-10-CM

## 2023-04-07 MED ORDER — LENALIDOMIDE 15 MG PO CAPS: ORAL_CAPSULE | ORAL | 0 refills | Status: DC

## 2023-04-07 NOTE — Telephone Encounter (Signed)
 Chart reviewed. Revlimid refilled per last office note with Dr. Ellin Saba.

## 2023-04-10 ENCOUNTER — Encounter (HOSPITAL_COMMUNITY)
Admission: RE | Admit: 2023-04-10 | Discharge: 2023-04-10 | Disposition: A | Discharge status: Home / Self Care | Payer: Medicare PPO | Source: Ambulatory Visit | Attending: Surgery | Admitting: Surgery

## 2023-04-10 ENCOUNTER — Other Ambulatory Visit: Payer: Self-pay

## 2023-04-10 ENCOUNTER — Encounter (HOSPITAL_COMMUNITY): Payer: Self-pay

## 2023-04-10 DIAGNOSIS — Z6841 Body Mass Index (BMI) 40.0 and over, adult: Secondary | ICD-10-CM | POA: Diagnosis not present

## 2023-04-10 DIAGNOSIS — D696 Thrombocytopenia, unspecified: Secondary | ICD-10-CM | POA: Diagnosis not present

## 2023-04-10 DIAGNOSIS — R030 Elevated blood-pressure reading, without diagnosis of hypertension: Secondary | ICD-10-CM | POA: Diagnosis not present

## 2023-04-10 DIAGNOSIS — I10 Essential (primary) hypertension: Secondary | ICD-10-CM | POA: Diagnosis not present

## 2023-04-10 DIAGNOSIS — E119 Type 2 diabetes mellitus without complications: Secondary | ICD-10-CM | POA: Diagnosis not present

## 2023-04-10 DIAGNOSIS — M51369 Other intervertebral disc degeneration, lumbar region without mention of lumbar back pain or lower extremity pain: Secondary | ICD-10-CM | POA: Diagnosis not present

## 2023-04-10 DIAGNOSIS — C9000 Multiple myeloma not having achieved remission: Secondary | ICD-10-CM | POA: Diagnosis not present

## 2023-04-10 DIAGNOSIS — F1120 Opioid dependence, uncomplicated: Secondary | ICD-10-CM | POA: Diagnosis not present

## 2023-04-12 ENCOUNTER — Ambulatory Visit (HOSPITAL_COMMUNITY)
Admission: RE | Admit: 2023-04-12 | Discharge: 2023-04-12 | Disposition: A | Discharge status: Home / Self Care | Payer: Medicare PPO | Attending: Surgery | Admitting: Surgery

## 2023-04-12 ENCOUNTER — Ambulatory Visit (HOSPITAL_COMMUNITY): Payer: Medicare PPO

## 2023-04-12 ENCOUNTER — Encounter (HOSPITAL_COMMUNITY): Payer: Self-pay | Admitting: Surgery

## 2023-04-12 ENCOUNTER — Ambulatory Visit (HOSPITAL_BASED_OUTPATIENT_CLINIC_OR_DEPARTMENT_OTHER): Payer: Medicare PPO | Admitting: Certified Registered"

## 2023-04-12 ENCOUNTER — Ambulatory Visit (HOSPITAL_COMMUNITY): Payer: Medicare PPO | Admitting: Certified Registered"

## 2023-04-12 ENCOUNTER — Encounter (HOSPITAL_COMMUNITY)
Admission: RE | Disposition: A | Discharge status: Home / Self Care | Payer: Self-pay | Source: Home / Self Care | Attending: Surgery

## 2023-04-12 DIAGNOSIS — Z7985 Long-term (current) use of injectable non-insulin antidiabetic drugs: Secondary | ICD-10-CM | POA: Diagnosis not present

## 2023-04-12 DIAGNOSIS — F419 Anxiety disorder, unspecified: Secondary | ICD-10-CM | POA: Insufficient documentation

## 2023-04-12 DIAGNOSIS — Z9981 Dependence on supplemental oxygen: Secondary | ICD-10-CM | POA: Insufficient documentation

## 2023-04-12 DIAGNOSIS — E785 Hyperlipidemia, unspecified: Secondary | ICD-10-CM | POA: Insufficient documentation

## 2023-04-12 DIAGNOSIS — I110 Hypertensive heart disease with heart failure: Secondary | ICD-10-CM | POA: Insufficient documentation

## 2023-04-12 DIAGNOSIS — D649 Anemia, unspecified: Secondary | ICD-10-CM | POA: Diagnosis not present

## 2023-04-12 DIAGNOSIS — Z452 Encounter for adjustment and management of vascular access device: Secondary | ICD-10-CM | POA: Diagnosis not present

## 2023-04-12 DIAGNOSIS — E119 Type 2 diabetes mellitus without complications: Secondary | ICD-10-CM | POA: Diagnosis not present

## 2023-04-12 DIAGNOSIS — Z79899 Other long term (current) drug therapy: Secondary | ICD-10-CM | POA: Diagnosis not present

## 2023-04-12 DIAGNOSIS — F32A Depression, unspecified: Secondary | ICD-10-CM | POA: Diagnosis not present

## 2023-04-12 DIAGNOSIS — C9000 Multiple myeloma not having achieved remission: Secondary | ICD-10-CM | POA: Insufficient documentation

## 2023-04-12 DIAGNOSIS — Z87891 Personal history of nicotine dependence: Secondary | ICD-10-CM

## 2023-04-12 DIAGNOSIS — E6689 Other obesity not elsewhere classified: Secondary | ICD-10-CM | POA: Diagnosis not present

## 2023-04-12 DIAGNOSIS — I5033 Acute on chronic diastolic (congestive) heart failure: Secondary | ICD-10-CM | POA: Diagnosis not present

## 2023-04-12 DIAGNOSIS — I509 Heart failure, unspecified: Secondary | ICD-10-CM | POA: Diagnosis not present

## 2023-04-12 DIAGNOSIS — J449 Chronic obstructive pulmonary disease, unspecified: Secondary | ICD-10-CM | POA: Insufficient documentation

## 2023-04-12 HISTORY — PX: PORTACATH PLACEMENT: SHX2246

## 2023-04-12 LAB — GLUCOSE, CAPILLARY: Glucose-Capillary: 151 mg/dL — ABNORMAL HIGH (ref 70–99)

## 2023-04-12 SURGERY — INSERTION, TUNNELED CENTRAL VENOUS DEVICE, WITH PORT
Anesthesia: General | Laterality: Right

## 2023-04-12 MED ORDER — FENTANYL CITRATE PF 50 MCG/ML IJ SOSY: 25.0000 ug | PREFILLED_SYRINGE | INTRAMUSCULAR | Status: DC | PRN

## 2023-04-12 MED ORDER — LIDOCAINE-PRILOCAINE 2.5-2.5 % EX CREA: 1.0000 | TOPICAL_CREAM | CUTANEOUS | 3 refills | Status: AC | PRN

## 2023-04-12 MED ORDER — SODIUM CHLORIDE 0.9 % IV SOLN
3.0000 g | INTRAVENOUS | Status: AC
  Administered 2023-04-12: 3 g via INTRAVENOUS
  Filled 2023-04-12: qty 3

## 2023-04-12 MED ORDER — SODIUM CHLORIDE (PF) 0.9 % IJ SOLN
INTRAMUSCULAR | Status: DC | PRN
  Administered 2023-04-12: 50 mL via INTRAVENOUS

## 2023-04-12 MED ORDER — CHLORHEXIDINE GLUCONATE CLOTH 2 % EX PADS: 6.0000 | MEDICATED_PAD | Freq: Once | CUTANEOUS | Status: DC

## 2023-04-12 MED ORDER — SODIUM CHLORIDE 0.9 % IV SOLN
INTRAVENOUS | Status: AC
  Filled 2023-04-12: qty 50

## 2023-04-12 MED ORDER — SUGAMMADEX SODIUM 200 MG/2ML IV SOLN
INTRAVENOUS | Status: DC | PRN
  Administered 2023-04-12: 300 mg via INTRAVENOUS

## 2023-04-12 MED ORDER — ROCURONIUM BROMIDE 10 MG/ML (PF) SYRINGE
PREFILLED_SYRINGE | INTRAVENOUS | Status: DC | PRN
  Administered 2023-04-12: 10 mg via INTRAVENOUS
  Administered 2023-04-12: 50 mg via INTRAVENOUS

## 2023-04-12 MED ORDER — LIDOCAINE HCL (PF) 1 % IJ SOLN
INTRAMUSCULAR | Status: DC | PRN
  Administered 2023-04-12: 20 mL

## 2023-04-12 MED ORDER — LIDOCAINE HCL (PF) 1 % IJ SOLN
INTRAMUSCULAR | Status: AC
  Filled 2023-04-12: qty 30

## 2023-04-12 MED ORDER — LIDOCAINE HCL (PF) 2 % IJ SOLN
INTRAMUSCULAR | Status: DC | PRN
  Administered 2023-04-12: 100 mg via INTRADERMAL

## 2023-04-12 MED ORDER — CHLORHEXIDINE GLUCONATE 0.12 % MT SOLN
OROMUCOSAL | Status: AC
  Filled 2023-04-12: qty 15

## 2023-04-12 MED ORDER — FENTANYL CITRATE (PF) 100 MCG/2ML IJ SOLN
INTRAMUSCULAR | Status: AC
  Filled 2023-04-12: qty 2

## 2023-04-12 MED ORDER — PROPOFOL 10 MG/ML IV BOLUS
INTRAVENOUS | Status: AC
  Filled 2023-04-12: qty 20

## 2023-04-12 MED ORDER — PHENYLEPHRINE 80 MCG/ML (10ML) SYRINGE FOR IV PUSH (FOR BLOOD PRESSURE SUPPORT)
PREFILLED_SYRINGE | INTRAVENOUS | Status: AC
  Filled 2023-04-12: qty 10

## 2023-04-12 MED ORDER — CEFAZOLIN SODIUM-DEXTROSE 2-4 GM/100ML-% IV SOLN: 2.0000 g | INTRAVENOUS | Status: DC

## 2023-04-12 MED ORDER — HEPARIN SOD (PORK) LOCK FLUSH 100 UNIT/ML IV SOLN
INTRAVENOUS | Status: AC
  Filled 2023-04-12: qty 5

## 2023-04-12 MED ORDER — ROCURONIUM BROMIDE 10 MG/ML (PF) SYRINGE
PREFILLED_SYRINGE | INTRAVENOUS | Status: AC
  Filled 2023-04-12: qty 10

## 2023-04-12 MED ORDER — LACTATED RINGERS IV SOLN: INTRAVENOUS | Status: DC | PRN

## 2023-04-12 MED ORDER — LIDOCAINE HCL (PF) 2 % IJ SOLN
INTRAMUSCULAR | Status: AC
  Filled 2023-04-12: qty 5

## 2023-04-12 MED ORDER — OXYCODONE HCL 5 MG PO TABS: 5.0000 mg | ORAL_TABLET | Freq: Once | ORAL | Status: DC | PRN

## 2023-04-12 MED ORDER — LACTATED RINGERS IV SOLN: INTRAVENOUS | Status: DC

## 2023-04-12 MED ORDER — ONDANSETRON HCL 4 MG/2ML IJ SOLN
INTRAMUSCULAR | Status: AC
  Filled 2023-04-12: qty 2

## 2023-04-12 MED ORDER — PROPOFOL 10 MG/ML IV BOLUS
INTRAVENOUS | Status: DC | PRN
  Administered 2023-04-12: 150 mg via INTRAVENOUS

## 2023-04-12 MED ORDER — ONDANSETRON HCL 4 MG/2ML IJ SOLN: 4.0000 mg | Freq: Once | INTRAMUSCULAR | Status: DC | PRN

## 2023-04-12 MED ORDER — PHENYLEPHRINE 80 MCG/ML (10ML) SYRINGE FOR IV PUSH (FOR BLOOD PRESSURE SUPPORT)
PREFILLED_SYRINGE | INTRAVENOUS | Status: DC | PRN
  Administered 2023-04-12 (×3): 160 ug via INTRAVENOUS
  Administered 2023-04-12: 80 ug via INTRAVENOUS
  Administered 2023-04-12 (×2): 160 ug via INTRAVENOUS
  Administered 2023-04-12 (×2): 80 ug via INTRAVENOUS
  Administered 2023-04-12 (×2): 160 ug via INTRAVENOUS

## 2023-04-12 MED ORDER — LACTATED RINGERS IV SOLN
INTRAVENOUS | Status: AC
Start: 2023-04-12 — End: 2023-04-12

## 2023-04-12 MED ORDER — HEPARIN SOD (PORK) LOCK FLUSH 100 UNIT/ML IV SOLN
INTRAVENOUS | Status: DC | PRN
  Administered 2023-04-12: 500 [IU] via INTRAVENOUS

## 2023-04-12 MED ORDER — OXYCODONE HCL 5 MG/5ML PO SOLN
5.0000 mg | Freq: Once | ORAL | Status: DC | PRN
Start: 2023-04-12 — End: 2023-04-12

## 2023-04-12 MED ORDER — MIDAZOLAM HCL 2 MG/2ML IJ SOLN
INTRAMUSCULAR | Status: DC | PRN
  Administered 2023-04-12 (×2): 1 mg via INTRAVENOUS

## 2023-04-12 MED ORDER — PROPOFOL 500 MG/50ML IV EMUL
INTRAVENOUS | Status: AC
  Filled 2023-04-12: qty 50

## 2023-04-12 MED ORDER — ORAL CARE MOUTH RINSE: 15.0000 mL | Freq: Once | OROMUCOSAL | Status: DC

## 2023-04-12 MED ORDER — ACETAMINOPHEN 500 MG PO TABS: 500.0000 mg | ORAL_TABLET | Freq: Four times a day (QID) | ORAL | 0 refills | Status: AC

## 2023-04-12 MED ORDER — MIDAZOLAM HCL 2 MG/2ML IJ SOLN
INTRAMUSCULAR | Status: AC
  Filled 2023-04-12: qty 2

## 2023-04-12 MED ORDER — CHLORHEXIDINE GLUCONATE 0.12 % MT SOLN: 15.0000 mL | Freq: Once | OROMUCOSAL | Status: DC

## 2023-04-12 MED ORDER — FENTANYL CITRATE (PF) 100 MCG/2ML IJ SOLN
INTRAMUSCULAR | Status: DC | PRN
  Administered 2023-04-12 (×2): 50 ug via INTRAVENOUS

## 2023-04-12 MED ORDER — ONDANSETRON HCL 4 MG/2ML IJ SOLN
INTRAMUSCULAR | Status: DC | PRN
  Administered 2023-04-12: 4 mg via INTRAVENOUS

## 2023-04-12 SURGICAL SUPPLY — 30 items
APPLICATOR CHLORAPREP 10.5 ORG (MISCELLANEOUS) ×2 IMPLANT
BAG DECANTER FOR FLEXI CONT (MISCELLANEOUS) ×2 IMPLANT
BLADE SURG SZ11 CARB STEEL (BLADE) ×2 IMPLANT
CLOTH BEACON ORANGE TIMEOUT ST (SAFETY) ×2 IMPLANT
COVER LIGHT HANDLE STERIS (MISCELLANEOUS) ×4 IMPLANT
COVER PROBE U/S 5X48 (MISCELLANEOUS) ×2 IMPLANT
DECANTER SPIKE VIAL GLASS SM (MISCELLANEOUS) ×2 IMPLANT
DERMABOND ADVANCED .7 DNX12 (GAUZE/BANDAGES/DRESSINGS) ×2 IMPLANT
DERMABOND ADVANCED .7 DNX6 (GAUZE/BANDAGES/DRESSINGS) IMPLANT
DRAPE C-ARM FOLDED MOBILE STRL (DRAPES) ×2 IMPLANT
ELECT REM PT RETURN 9FT ADLT (ELECTROSURGICAL) ×1 IMPLANT
ELECTRODE REM PT RTRN 9FT ADLT (ELECTROSURGICAL) ×2 IMPLANT
GLOVE BIOGEL PI IND STRL 6.5 (GLOVE) ×2 IMPLANT
GLOVE BIOGEL PI IND STRL 7.0 (GLOVE) ×4 IMPLANT
GLOVE SURG SS PI 6.5 STRL IVOR (GLOVE) ×2 IMPLANT
GOWN STRL REUS W/TWL LRG LVL3 (GOWN DISPOSABLE) ×4 IMPLANT
IV NS 500ML BAXH (IV SOLUTION) ×2 IMPLANT
KIT PORT POWER 8FR ISP MRI (Port) ×2 IMPLANT
KIT TURNOVER KIT A (KITS) ×2 IMPLANT
MANIFOLD NEPTUNE II (INSTRUMENTS) ×2 IMPLANT
NDL HYPO 25X1 1.5 SAFETY (NEEDLE) ×2 IMPLANT
NEEDLE HYPO 25X1 1.5 SAFETY (NEEDLE) ×1 IMPLANT
PACK MINOR (CUSTOM PROCEDURE TRAY) ×2 IMPLANT
PAD ARMBOARD 7.5X6 YLW CONV (MISCELLANEOUS) ×2 IMPLANT
POSITIONER HEAD 8X9X4 ADT (SOFTGOODS) ×2 IMPLANT
SET BASIN LINEN APH (SET/KITS/TRAYS/PACK) ×2 IMPLANT
SUT MNCRL AB 4-0 PS2 18 (SUTURE) ×2 IMPLANT
SUT VIC AB 3-0 SH 27X BRD (SUTURE) ×2 IMPLANT
SYR 10ML LL (SYRINGE) ×4 IMPLANT
SYR CONTROL 10ML LL (SYRINGE) ×2 IMPLANT

## 2023-04-12 NOTE — Discharge Instructions (Signed)
Ambulatory Surgery Discharge Instructions  General Anesthesia or Sedation Do not drive or operate heavy machinery for 24 hours.  Do not consume alcohol, tranquilizers, sleeping medications, or any non-prescribed medications for 24 hours. Do not make important decisions or sign any important papers in the next 24 hours. You should have someone with you tonight at home.  Activity  You are advised to go directly home from the hospital.  Restrict your activities and rest for a day.  Resume light activity tomorrow. No heavy lifting over 10 lbs or strenuous exercise.  Fluids and Diet Begin with clear liquids, bouillon, dry toast, soda crackers.  If not nauseated, you may go to a regular diet when you desire.  Greasy and spicy foods are not advised.  Medications  If you have not had a bowel movement in 24 hours, take 2 tablespoons over the counter Milk of mag.             You May resume your blood thinners tomorrow (Aspirin, coumadin, or other).   Operative Site  You have a liquid bandage over your incisions, this will begin to flake off in about a week. Ok to shower tomorrow. Keep wound clean and dry. No baths or swimming. No lifting more than 10 pounds.  Contact Information: If you have questions or concerns, please call our office, [phone], Monday- Thursday 8AM-5PM and Friday 8AM-12Noon.  If it is after hours or on the weekend, please call [facility] Main Number, [phone], and ask to speak to the surgeon on call for Dr. Pappayliou at .   SPECIFIC COMPLICATIONS TO WATCH FOR: Inability to urinate Fever over 101? F by mouth Nausea and vomiting lasting longer than 24 hours. Pain not relieved by medication ordered Swelling around the operative site Increased redness, warmth, hardness, around operative area Numbness, tingling, or cold fingers or toes Blood -soaked dressing, (small amounts of oozing may be normal) Increasing and progressive drainage from surgical area or exam  site  

## 2023-04-12 NOTE — Anesthesia Procedure Notes (Signed)
 Procedure Name: Intubation Date/Time: 04/12/2023 10:42 AM  Performed by: Sherwin Donate, CRNAPre-anesthesia Checklist: Patient identified, Emergency Drugs available, Suction available and Patient being monitored Patient Re-evaluated:Patient Re-evaluated prior to induction Oxygen  Delivery Method: Circle system utilized Preoxygenation: Pre-oxygenation with 100% oxygen  Induction Type: IV induction Ventilation: Mask ventilation without difficulty Laryngoscope Size: Miller and 3 Grade View: Grade I Tube type: Oral Tube size: 7.0 mm Number of attempts: 1 Airway Equipment and Method: Stylet and Oral airway Placement Confirmation: ETT inserted through vocal cords under direct vision, positive ETCO2 and breath sounds checked- equal and bilateral Secured at: 23 cm Tube secured with: Tape Dental Injury: Teeth and Oropharynx as per pre-operative assessment

## 2023-04-12 NOTE — Anesthesia Preprocedure Evaluation (Addendum)
 Anesthesia Evaluation  Patient identified by MRN, date of birth, ID band Patient awake    Reviewed: Allergy & Precautions, NPO status , Patient's Chart, lab work & pertinent test results  History of Anesthesia Complications Negative for: history of anesthetic complications  Airway Mallampati: III  TM Distance: >3 FB Neck ROM: Full    Dental  (+) Dental Advisory Given, Upper Dentures, Lower Dentures   Pulmonary shortness of breath, with exertion and Long-Term Oxygen  Therapy, pneumonia, resolved, COPD,  oxygen  dependent, former smoker   Pulmonary exam normal breath sounds clear to auscultation       Cardiovascular Exercise Tolerance: Poor hypertension, Pt. on medications +CHF  Normal cardiovascular exam Rhythm:Regular Rate:Normal  Echo shows some mild stiffness to the heart which is common with aging but can cause some swelling/fluid to build up. One of her heart valves (aortic valve) is mildly thicker than normal but overall working well, this creates her heart murmur and is not considered to be a significant issue. Will discuss in more detail at f/u.  Echo great EF but grade 1 diastolic dysfunction   Neuro/Psych  PSYCHIATRIC DISORDERS Anxiety Depression       GI/Hepatic ,GERD  Medicated,,(+) Cirrhosis   Esophageal Varices      Endo/Other  diabetes, Well Controlled, Type 2, Oral Hypoglycemic Agents  Class 4 obesity  Renal/GU      Musculoskeletal  (+) Arthritis ,    Abdominal  (+) + obese  Peds  Hematology  (+) Blood dyscrasia, anemia   Anesthesia Other Findings   Reproductive/Obstetrics                             Anesthesia Physical Anesthesia Plan  ASA: 4  Anesthesia Plan: General   Post-op Pain Management: Minimal or no pain anticipated   Induction: Intravenous  PONV Risk Score and Plan: Ondansetron  and Midazolam   Airway Management Planned: Oral ETT  Additional Equipment:  None  Intra-op Plan:   Post-operative Plan: Extubation in OR  Informed Consent: I have reviewed the patients History and Physical, chart, labs and discussed the procedure including the risks, benefits and alternatives for the proposed anesthesia with the patient or authorized representative who has indicated his/her understanding and acceptance.     Dental advisory given  Plan Discussed with: CRNA  Anesthesia Plan Comments:         Anesthesia Quick Evaluation

## 2023-04-12 NOTE — Op Note (Signed)
 Operative Note 04/12/23   Preoperative Diagnosis: Multiple myeloma   Postoperative Diagnosis: Same   Procedure(s) Performed: Right Internal Jugular Port-A-Cath placement with ultrasound guidance   Surgeon: Lidia Reels, DO    Assistants: No qualified resident was available   Anesthesia: GETA   Anesthesiologist: Araceli Knight, MD    Specimens: None   Estimated Blood Loss: Minimal   Fluoroscopy time: 40 seconds   Blood Replacement: None    Complications: None    Operative Findings: Appropriately positioned right internal jugular port-a-cath at the completion of the case  Indications: Patient is a 73 year old female who presents for port-a-cath insertion.  She previously had a port, but this had to be removed secondary to MSSA bacteremia.  She continues to receive IV infusions and requires replacement of her port.  She is agreeable to surgery at this time.  All risks and benefits of performing this procedure were discussed with the patient including pain, infection, bleeding, damage to the surrounding structures, and need for more procedures or surgery. The patient voiced understanding of the procedure, all questions were sought and answered, and consent was obtained.  Procedure: The patient was brought into the operating room and general anesthesia was induced.  One percent lidocaine  was used for local anesthesia.   The right chest and neck was prepped and draped in the usual sterile fashion.  Preoperative antibiotics were given.  Using ultrasound guidance, the access needle was advanced into the right internal jugular vein using the Seldinger technique without difficulty.  A guidewire was then advanced into the right atrium under fluoroscopic guidance.  Ectopia was not noted.  The ultrasound confirmed the wire within the internal jugular.  The skin was knicked.  An incision was made below the right clavicle.  A subcutaneous pocket was formed. The catheter was then tunneled  from the pocket to the access site in the neck.  An introducer and peel-away sheath were placed over the guidewire. The catheter was then inserted through the peel-away sheath and the peel-away sheath was removed.  A spot film was performed to confirm the position. The catheter was then attached to the port and the port placed in subcutaneous pocket. Adequate positioning was confirmed by fluoroscopy. Hemostasis was confirmed.  Good backflow of blood was noted on aspiration of the port. The port was flushed with heparin  flush. Subcutaneous layer was reapproximated using a 3-0 Vicryl interrupted suture. The skin was closed using a 4-0 Vicryl subcuticular suture. Dermabond was applied.    All tape and needle counts were correct at the end of the procedure. The patient was transferred to PACU in stable condition. A chest x-ray will be performed at that time.  Lidia Reels, DO  [facility] [address] [place], [place] [address] [phone] (office)

## 2023-04-12 NOTE — Transfer of Care (Signed)
 Immediate Anesthesia Transfer of Care Note  Patient: Swan  G Clanton  Procedure(s) Performed: INSERTION PORT-A-CATH, RIJ (Right)  Patient Location: PACU  Anesthesia Type:General  Level of Consciousness: awake  Airway & Oxygen  Therapy: Patient Spontanous Breathing and Patient connected to face mask oxygen   Post-op Assessment: Report given to RN and Post -op Vital signs reviewed and stable  Post vital signs: Reviewed and stable  Last Vitals:  Vitals Value Taken Time  BP 172/80 04/12/23 1206  Temp    Pulse 88 04/12/23 1207  Resp 23 04/12/23 1207  SpO2 100 % 04/12/23 1207  Vitals shown include unfiled device data.  Last Pain:  Vitals:   04/12/23 0910  PainSc: 0-No pain         Complications: No notable events documented.

## 2023-04-12 NOTE — Progress Notes (Signed)
 [facility]  Spoke with the patient's family in the consultation room.  I explained that she tolerated the procedure without difficulty.  She has dissolvable stitches under the skin with overlying skin glue.  This will flake off in 10 to 14 days.  She can take Tylenol  PRN for pain.  The patient will follow-up with me in 2 weeks for phone follow-up.  All questions were answered to her expressed satisfaction.  Lidia Reels, DO [facility] [address] [place], [place] [address] [phone] (office)

## 2023-04-12 NOTE — Interval H&P Note (Signed)
 History and Physical Interval Note:  04/12/2023 9:47 AM  Savannah Burns  has presented today for surgery, with the diagnosis of MULTIPLE MYELOMA.  The various methods of treatment have been discussed with the patient and family. After consideration of risks, benefits and other options for treatment, the patient has consented to  Procedure(s): INSERTION PORT-A-CATH, RIJ (Right) as a surgical intervention.  The patient's history has been reviewed, patient examined, no change in status, stable for surgery.  I have reviewed the patient's chart and labs.  Questions were answered to the patient's satisfaction.     Delana Manganello A Markeda Narvaez

## 2023-04-12 NOTE — Anesthesia Postprocedure Evaluation (Signed)
 Anesthesia Post Note  Patient: Savannah  G Burns  Procedure(s) Performed: INSERTION PORT-A-CATH, RIJ (Right)  Patient location during evaluation: PACU Anesthesia Type: General Level of consciousness: awake and alert Pain management: pain level controlled Vital Signs Assessment: post-procedure vital signs reviewed and stable Respiratory status: spontaneous breathing, nonlabored ventilation, respiratory function stable and patient connected to nasal cannula oxygen  Cardiovascular status: blood pressure returned to baseline and stable Postop Assessment: no apparent nausea or vomiting Anesthetic complications: no   There were no known notable events for this encounter.   Last Vitals:  Vitals:   04/12/23 1315 04/12/23 1405  BP: (!) 141/65 (!) 124/92  Pulse: 79 86  Resp: 18 18  Temp:  36.4 C  SpO2: 100% 96%    Last Pain:  Vitals:   04/12/23 1405  TempSrc: Oral  PainSc: 0-No pain                 Elasha Tess L Ephraim Reichel

## 2023-04-13 ENCOUNTER — Inpatient Hospital Stay: Payer: Medicare PPO

## 2023-04-13 ENCOUNTER — Encounter (HOSPITAL_COMMUNITY): Payer: Self-pay | Admitting: Surgery

## 2023-04-13 VITALS — BP 139/72 | HR 89 | Temp 97.8°F | Resp 19

## 2023-04-13 VITALS — BP 162/61 | HR 85 | Temp 98.2°F | Resp 20

## 2023-04-13 DIAGNOSIS — C9000 Multiple myeloma not having achieved remission: Secondary | ICD-10-CM

## 2023-04-13 DIAGNOSIS — Z79899 Other long term (current) drug therapy: Secondary | ICD-10-CM | POA: Diagnosis not present

## 2023-04-13 DIAGNOSIS — Z5112 Encounter for antineoplastic immunotherapy: Secondary | ICD-10-CM | POA: Diagnosis not present

## 2023-04-13 DIAGNOSIS — E538 Deficiency of other specified B group vitamins: Secondary | ICD-10-CM | POA: Diagnosis not present

## 2023-04-13 DIAGNOSIS — D500 Iron deficiency anemia secondary to blood loss (chronic): Secondary | ICD-10-CM

## 2023-04-13 DIAGNOSIS — D509 Iron deficiency anemia, unspecified: Secondary | ICD-10-CM | POA: Diagnosis not present

## 2023-04-13 DIAGNOSIS — Z95828 Presence of other vascular implants and grafts: Secondary | ICD-10-CM

## 2023-04-13 LAB — CBC WITH DIFFERENTIAL/PLATELET
Abs Immature Granulocytes: 0.02 10*3/uL (ref 0.00–0.07)
Basophils Absolute: 0 10*3/uL (ref 0.0–0.1)
Basophils Relative: 0 %
Eosinophils Absolute: 0 10*3/uL (ref 0.0–0.5)
Eosinophils Relative: 0 %
HCT: 34.3 % — ABNORMAL LOW (ref 36.0–46.0)
Hemoglobin: 11.4 g/dL — ABNORMAL LOW (ref 12.0–15.0)
Immature Granulocytes: 0 %
Lymphocytes Relative: 21 %
Lymphs Abs: 1.1 10*3/uL (ref 0.7–4.0)
MCH: 34.4 pg — ABNORMAL HIGH (ref 26.0–34.0)
MCHC: 33.2 g/dL (ref 30.0–36.0)
MCV: 103.6 fL — ABNORMAL HIGH (ref 80.0–100.0)
Monocytes Absolute: 0.5 10*3/uL (ref 0.1–1.0)
Monocytes Relative: 9 %
Neutro Abs: 3.5 10*3/uL (ref 1.7–7.7)
Neutrophils Relative %: 70 %
Platelets: 88 10*3/uL — ABNORMAL LOW (ref 150–400)
RBC: 3.31 MIL/uL — ABNORMAL LOW (ref 3.87–5.11)
RDW: 14.3 % (ref 11.5–15.5)
Smear Review: DECREASED
WBC: 5.1 10*3/uL (ref 4.0–10.5)
nRBC: 0 % (ref 0.0–0.2)

## 2023-04-13 LAB — COMPREHENSIVE METABOLIC PANEL
ALT: 16 U/L (ref 0–44)
AST: 18 U/L (ref 15–41)
Albumin: 3.3 g/dL — ABNORMAL LOW (ref 3.5–5.0)
Alkaline Phosphatase: 108 U/L (ref 38–126)
Anion gap: 6 (ref 5–15)
BUN: 12 mg/dL (ref 8–23)
CO2: 30 mmol/L (ref 22–32)
Calcium: 8.3 mg/dL — ABNORMAL LOW (ref 8.9–10.3)
Chloride: 101 mmol/L (ref 98–111)
Creatinine, Ser: 0.54 mg/dL (ref 0.44–1.00)
GFR, Estimated: >60 mL/min (ref >60)
Glucose, Bld: 193 mg/dL — ABNORMAL HIGH (ref 70–99)
Potassium: 4 mmol/L (ref 3.5–5.1)
Sodium: 137 mmol/L (ref 135–145)
Total Bilirubin: 0.6 mg/dL (ref 0.0–1.2)
Total Protein: 5.8 g/dL — ABNORMAL LOW (ref 6.5–8.1)

## 2023-04-13 LAB — MAGNESIUM: Magnesium: 2 mg/dL (ref 1.7–2.4)

## 2023-04-13 LAB — IRON AND TIBC
Iron: 95 ug/dL (ref 28–170)
Saturation Ratios: 32 % — ABNORMAL HIGH (ref 10.4–31.8)
TIBC: 294 ug/dL (ref 250–450)
UIBC: 199 ug/dL

## 2023-04-13 LAB — FERRITIN: Ferritin: 858 ng/mL — ABNORMAL HIGH (ref 11–307)

## 2023-04-13 MED ORDER — DARATUMUMAB-HYALURONIDASE-FIHJ 1800-30000 MG-UT/15ML ~~LOC~~ SOLN
1800.0000 mg | Freq: Once | SUBCUTANEOUS | Status: AC
Start: 2023-04-13 — End: 2023-04-13
  Administered 2023-04-13: 1800 mg via SUBCUTANEOUS
  Filled 2023-04-13: qty 15

## 2023-04-13 MED ORDER — HEPARIN SOD (PORK) LOCK FLUSH 100 UNIT/ML IV SOLN
500.0000 [IU] | Freq: Once | INTRAVENOUS | Status: AC
  Administered 2023-04-13: 500 [IU] via INTRAVENOUS

## 2023-04-13 MED ORDER — SODIUM CHLORIDE 0.9% FLUSH
10.0000 mL | INTRAVENOUS | Status: DC | PRN
  Administered 2023-04-13: 10 mL via INTRAVENOUS

## 2023-04-13 MED ORDER — SODIUM CHLORIDE 0.9 % IV SOLN: Freq: Once | INTRAVENOUS | Status: AC

## 2023-04-13 MED ORDER — SODIUM CHLORIDE 0.9 % IV SOLN
500.0000 mg | Freq: Once | INTRAVENOUS | Status: AC
  Administered 2023-04-13: 500 mg via INTRAVENOUS
  Filled 2023-04-13: qty 20

## 2023-04-13 MED ORDER — CYANOCOBALAMIN 1000 MCG/ML IJ SOLN
1000.0000 ug | Freq: Once | INTRAMUSCULAR | Status: AC
  Administered 2023-04-13: 1000 ug via INTRAMUSCULAR
  Filled 2023-04-13: qty 1

## 2023-04-13 NOTE — Progress Notes (Signed)
Patients port flushed without difficulty.  Good blood return noted with no bruising or swelling noted at site.  Patient remains accessed for treatment.  

## 2023-04-13 NOTE — Patient Instructions (Signed)
[facility] [place] - A DEPT OF [facility]  Discharge Instructions: Thank you for choosing [facility] to provide your oncology and hematology care.  If you have a lab appointment with the Cancer Center - please note that after April 8th, 2024, all labs will be drawn in the cancer center.  You do not have to check in or register with the main entrance as you have in the past but will complete your check-in in the cancer center.  Wear comfortable clothing and clothing appropriate for easy access to any Portacath or PICC line.   We strive to give you quality time with your provider. You may need to reschedule your appointment if you arrive late (15 or more minutes).  Arriving late affects you and other patients whose appointments are after yours.  Also, if you miss three or more appointments without notifying the office, you may be dismissed from the clinic at the provider's discretion.      For prescription refill requests, have your pharmacy contact our office and allow 72 hours for refills to be completed.    Today you received the following chemotherapy and/or immunotherapy agents Dara [place], B12 injection, and Venofer IV iron infusion.    To help prevent nausea and vomiting after your treatment, we encourage you to take your nausea medication as directed.  Daratumumab; Hyaluronidase Injection What is this medication? DARATUMUMAB; HYALURONIDASE (dar a toom ue mab; hye al ur ON i dase) treats multiple myeloma, a type of bone marrow cancer. Daratumumab works by blocking a protein that causes cancer cells to grow and multiply. This helps to slow or stop the spread of cancer cells. Hyaluronidase works by increasing the absorption of other medications in the body to help them work better. This medication may also be used treat amyloidosis, a condition that causes the buildup of a protein (amyloid) in your body. It works by reducing the buildup of this protein, which  decreases symptoms. It is a combination medication that contains a monoclonal antibody. This medicine may be used for other purposes; ask your health care provider or pharmacist if you have questions. COMMON BRAND NAME(S): DARZALEX FASPRO What should I tell my care team before I take this medication? They need to know if you have any of these conditions: Heart disease Infection, such as chickenpox, cold sores, herpes, hepatitis B Lung or breathing disease An unusual or allergic reaction to daratumumab, hyaluronidase, other medications, foods, dyes, or preservatives Pregnant or trying to get pregnant Breast-feeding How should I use this medication? This medication is injected under the skin. It is given by your care team in a hospital or clinic setting. Talk to your care team about the use of this medication in children. Special care may be needed. Overdosage: If you think you have taken too much of this medicine contact a poison control center or emergency room at once. NOTE: This medicine is only for you. Do not share this medicine with others. What if I miss a dose? Keep appointments for follow-up doses. It is important not to miss your dose. Call your care team if you are unable to keep an appointment. What may interact with this medication? Interactions have not been studied. This list may not describe all possible interactions. Give your health care provider a list of all the medicines, herbs, non-prescription drugs, or dietary supplements you use. Also tell them if you smoke, drink alcohol, or use illegal drugs. Some items may interact  with your medicine. What should I watch for while using this medication? Your condition will be monitored carefully while you are receiving this medication. This medication can cause serious allergic reactions. To reduce your risk, your care team may give you other medication to take before receiving this one. Be sure to follow the directions from your  care team. This medication can affect the results of blood tests to match your blood type. These changes can last for up to 6 months after the final dose. Your care team will do blood tests to match your blood type before you start treatment. Tell all of your care team that you are being treated with this medication before receiving a blood transfusion. This medication can affect the results of some tests used to determine treatment response; extra tests may be needed to evaluate response. Talk to your care team if you wish to become pregnant or think you are pregnant. This medication can cause serious birth defects if taken during pregnancy and for 3 months after the last dose. A reliable form of contraception is recommended while taking this medication and for 3 months after the last dose. Talk to your care team about effective forms of contraception. Do not breast-feed while taking this medication. What side effects may I notice from receiving this medication? Side effects that you should report to your care team as soon as possible: Allergic reactions--skin rash, itching, hives, swelling of the face, lips, tongue, or throat Heart rhythm changes--fast or irregular heartbeat, dizziness, feeling faint or lightheaded, chest pain, trouble breathing Infection--fever, chills, cough, sore throat, wounds that don't heal, pain or trouble when passing urine, general feeling of discomfort or being unwell Infusion reactions--chest pain, shortness of breath or trouble breathing, feeling faint or lightheaded Sudden eye pain or change in vision such as blurry vision, seeing halos around lights, vision loss Unusual bruising or bleeding Side effects that usually do not require medical attention (report to your care team if they continue or are bothersome): Constipation Diarrhea Fatigue Nausea Pain, tingling, or numbness in the hands or feet Swelling of the ankles, hands, or feet This list may not describe all  possible side effects. Call your doctor for medical advice about side effects. You may report side effects to FDA at 1-800-FDA-1088. Where should I keep my medication? This medication is given in a hospital or clinic. It will not be stored at home. NOTE: This sheet is a summary. It may not cover all possible information. If you have questions about this medicine, talk to your doctor, pharmacist, or health care provider.  2024 Elsevier/Gold Standard (2021-07-20 00:00:00)   BELOW ARE SYMPTOMS THAT SHOULD BE REPORTED IMMEDIATELY: *FEVER GREATER THAN 100.4 F (38 C) OR HIGHER *CHILLS OR SWEATING *NAUSEA AND VOMITING THAT IS NOT CONTROLLED WITH YOUR NAUSEA MEDICATION *UNUSUAL SHORTNESS OF BREATH *UNUSUAL BRUISING OR BLEEDING *URINARY PROBLEMS (pain or burning when urinating, or frequent urination) *BOWEL PROBLEMS (unusual diarrhea, constipation, pain near the anus) TENDERNESS IN MOUTH AND THROAT WITH OR WITHOUT PRESENCE OF ULCERS (sore throat, sores in mouth, or a toothache) UNUSUAL RASH, SWELLING OR PAIN  UNUSUAL VAGINAL DISCHARGE OR ITCHING   Items with * indicate a potential emergency and should be followed up as soon as possible or go to the Emergency Department if any problems should occur.  Please show the CHEMOTHERAPY ALERT CARD or IMMUNOTHERAPY ALERT CARD at check-in to the Emergency Department and triage nurse.  Should you have questions after your visit or need to cancel or  reschedule your appointment, please contact [facility]  - A DEPT OF [facility] [phone]  and follow the prompts.  Office hours are 8:00 a.m. to 4:30 p.m. Monday - Friday. Please note that voicemails left after 4:00 p.m. may not be returned until the following business day.  We are closed weekends and major holidays. You have access to a nurse at all times for urgent questions. Please call the main number to the clinic [phone] and follow the prompts.  For any non-urgent  questions, you may also contact your provider using MyChart. We now offer e-Visits for anyone 65 and older to request care online for non-urgent symptoms. For details visit [email or web address].   Also download the MyChart app! Go to the app store, search "MyChart", open the app, select [place], and log in with your MyChart username and password.

## 2023-04-13 NOTE — Progress Notes (Signed)
Patient presents today for Darzalex Faspro, B12 injection, and Venofer 500 mg IV infusion. Patient is in satisfactory condition with new complaints voiced.Patient c/o having black, tarry stools x 2 weeks and  patient has concerns about internal bleeding again. Labs were drawn today.Dr.K wanted additional iron panel labs drawn today as well. Vital signs are stable. Labs reviewed and all labs are within treatment parameters.  We will proceed with treatment per MD orders.    Pt took pre-meds at home prior to arrival.  Treatment given today per MD orders. Tolerated infusion without adverse affects. Vital signs stable. No complaints at this time. Discharged from clinic via wheelchair in stable condition. Alert and oriented x 3. F/U with [facility] as scheduled.

## 2023-04-14 LAB — KAPPA/LAMBDA LIGHT CHAINS
Kappa free light chain: 12.2 mg/L (ref 3.3–19.4)
Kappa, lambda light chain ratio: 2.98 — ABNORMAL HIGH (ref 0.26–1.65)
Lambda free light chains: 4.1 mg/L — ABNORMAL LOW (ref 5.7–26.3)

## 2023-04-20 ENCOUNTER — Inpatient Hospital Stay: Payer: Medicare PPO

## 2023-04-23 LAB — PROTEIN ELECTROPHORESIS, SERUM
A/G Ratio: 1.8 — ABNORMAL HIGH (ref 0.7–1.7)
Albumin ELP: 3.4 g/dL (ref 2.9–4.4)
Alpha-1-Globulin: 0.2 g/dL (ref 0.0–0.4)
Alpha-2-Globulin: 0.6 g/dL (ref 0.4–1.0)
Beta Globulin: 0.7 g/dL (ref 0.7–1.3)
Gamma Globulin: 0.4 g/dL (ref 0.4–1.8)
Globulin, Total: 1.9 g/dL — ABNORMAL LOW (ref 2.2–3.9)
M-Spike, %: 0.3 g/dL — ABNORMAL HIGH
Total Protein ELP: 5.3 g/dL — ABNORMAL LOW (ref 6.0–8.5)

## 2023-04-26 ENCOUNTER — Ambulatory Visit: Payer: Medicare PPO | Admitting: Surgery

## 2023-04-26 DIAGNOSIS — J449 Chronic obstructive pulmonary disease, unspecified: Secondary | ICD-10-CM | POA: Diagnosis not present

## 2023-04-26 DIAGNOSIS — Z09 Encounter for follow-up examination after completed treatment for conditions other than malignant neoplasm: Secondary | ICD-10-CM

## 2023-04-26 NOTE — Progress Notes (Signed)
[facility]  I am calling the patient for post operative evaluation. This is not a billable encounter as it is under the global charges for the surgery.  The patient had a right internal jugular port-a-cath insertion on 1/15. I spoke with the patient's granddaughter who stated that the patient was sleeping.  She does not believe that her grandmother has had any issues related to the port.  She will have her call the office if she has any concerns.  Will see the patient PRN.   Theophilus Kinds, DO [facility] [address]ella R[address] [place], [place] [address] [phone] (office)

## 2023-04-27 ENCOUNTER — Inpatient Hospital Stay: Payer: Medicare PPO

## 2023-05-04 ENCOUNTER — Ambulatory Visit (INDEPENDENT_AMBULATORY_CARE_PROVIDER_SITE_OTHER): Payer: Medicare PPO

## 2023-05-04 ENCOUNTER — Inpatient Hospital Stay: Payer: Medicare PPO

## 2023-05-04 ENCOUNTER — Encounter: Payer: Self-pay | Admitting: Family

## 2023-05-04 ENCOUNTER — Inpatient Hospital Stay: Payer: Medicare PPO | Admitting: Hematology

## 2023-05-04 ENCOUNTER — Ambulatory Visit: Payer: Medicare PPO | Admitting: Family

## 2023-05-04 VITALS — BP 109/67 | HR 96 | Temp 98.0°F

## 2023-05-04 DIAGNOSIS — R0602 Shortness of breath: Secondary | ICD-10-CM

## 2023-05-04 DIAGNOSIS — I517 Cardiomegaly: Secondary | ICD-10-CM | POA: Diagnosis not present

## 2023-05-04 DIAGNOSIS — J441 Chronic obstructive pulmonary disease with (acute) exacerbation: Secondary | ICD-10-CM

## 2023-05-04 DIAGNOSIS — R062 Wheezing: Secondary | ICD-10-CM | POA: Diagnosis not present

## 2023-05-04 DIAGNOSIS — Z452 Encounter for adjustment and management of vascular access device: Secondary | ICD-10-CM | POA: Diagnosis not present

## 2023-05-04 MED ORDER — ALBUTEROL SULFATE HFA 108 (90 BASE) MCG/ACT IN AERS: 2.0000 | INHALATION_SPRAY | Freq: Four times a day (QID) | RESPIRATORY_TRACT | 0 refills | Status: DC | PRN

## 2023-05-04 MED ORDER — DOXYCYCLINE HYCLATE 100 MG PO TABS: 100.0000 mg | ORAL_TABLET | Freq: Two times a day (BID) | ORAL | 0 refills | Status: DC

## 2023-05-04 MED ORDER — ALBUTEROL SULFATE (2.5 MG/3ML) 0.083% IN NEBU: 2.5000 mg | INHALATION_SOLUTION | Freq: Four times a day (QID) | RESPIRATORY_TRACT | 1 refills | Status: DC | PRN

## 2023-05-04 MED ORDER — PREDNISONE 10 MG (21) PO TBPK: ORAL_TABLET | ORAL | 0 refills | Status: DC

## 2023-05-04 NOTE — Progress Notes (Signed)
 Subjective:    Patient ID: Savannah  KANDICE Burns, female    DOB: Aug 08, 1950, 73 y.o.   MRN: [id number]  Chief Complaint  Patient presents with   Cough   Shortness of Breath     3-4Days- on 3 litters o2 thinks she has been running a fever chills body    Pt presents to the office today with SOB, cough, fever for 3-4 days.  Cough This is a new problem. The current episode started in the past 7 days. The problem has been gradually worsening. The problem occurs every few minutes. The cough is Productive of sputum. Associated symptoms include chills, a fever, headaches, myalgias, nasal congestion, postnasal drip, shortness of breath and wheezing. Pertinent negatives include no ear congestion or ear pain. She has tried rest (tylenol ) for the symptoms. The treatment provided mild relief. Her past medical history is significant for COPD.  Shortness of Breath Associated symptoms include a fever, headaches and wheezing. Pertinent negatives include no ear pain. Her past medical history is significant for COPD.      Review of Systems  Constitutional:  Positive for chills and fever.  HENT:  Positive for postnasal drip. Negative for ear pain.   Respiratory:  Positive for cough, shortness of breath and wheezing.   Musculoskeletal:  Positive for myalgias.  Neurological:  Positive for headaches.  All other systems reviewed and are negative.   Social History   Socioeconomic History   Marital status: Widowed    Spouse name: Not on file   Number of children: 2   Years of education: 14   Highest education level: Not on file  Occupational History   Occupation: Retired   Tobacco Use   Smoking status: Former    Current packs/day: 0.00    Average packs/day: 1.5 packs/day for 40.0 years (60.0 ttl pk-yrs)    Types: Cigarettes    Start date: 04/29/1975    Quit date: 04/29/2015    Years since quitting: 8.0   Smokeless tobacco: Never   Tobacco comments:    Quit smoking 04/2015- Previous 1.5 ppd smoker   Vaping Use   Vaping status: Never Used  Substance and Sexual Activity   Alcohol  use: No    Alcohol /week: 0.0 standard drinks of alcohol    Drug use: No   Sexual activity: Not Currently    Birth control/protection: Post-menopausal  Other Topics Concern   Not on file  Social History Narrative   Her 73 year old granddaughter lives with her - one daughter lives nearby, but she doesn't have a good relationship with her. Has a great relationship with other daughter who lives 1.5 hrs away - talks to her daily on the phone.   Social Drivers of Corporate Investment Banker Strain: Low Risk  (01/27/2023)   Overall Financial Resource Strain (CARDIA)    Difficulty of Paying Living Expenses: Not hard at all  Recent Concern: Financial Resource Strain - High Risk (11/15/2022)   Overall Financial Resource Strain (CARDIA)    Difficulty of Paying Living Expenses: Very hard  Food Insecurity: No Food Insecurity (01/27/2023)   Hunger Vital Sign    Worried About Running Out of Food in the Last Year: Never true    Ran Out of Food in the Last Year: Never true  Recent Concern: Food Insecurity - Food Insecurity Present (11/15/2022)   Hunger Vital Sign    Worried About Running Out of Food in the Last Year: Sometimes true    Ran Out of Food in the  Last Year: Never true  Transportation Needs: No Transportation Needs (01/27/2023)   PRAPARE - Administrator, Civil Service (Medical): No    Lack of Transportation (Non-Medical): No  Physical Activity: Inactive (01/27/2023)   Exercise Vital Sign    Days of Exercise per Week: 0 days    Minutes of Exercise per Session: 0 min  Stress: No Stress Concern Present (01/27/2023)   [employer] of Occupational Health - Occupational Stress Questionnaire    Feeling of Stress : Not at all  Social Connections: Socially Isolated (01/27/2023)   Social Connection and Isolation Panel [NHANES]    Frequency of Communication with Friends and Family: More than three times  a week    Frequency of Social Gatherings with Friends and Family: More than three times a week    Attends Religious Services: Never    Database Administrator or Organizations: No    Attends Banker Meetings: Never    Marital Status: Widowed   Family History  Problem Relation Age of Onset   Diabetes Father    Heart disease Father 60       MI   Hypertension Father    Anemia Mother        Transfusion dependent   COPD Sister    Cancer Paternal Grandmother 28       Pancreatic        Objective:   Physical Exam Vitals reviewed.  Constitutional:      General: She is not in acute distress.    Appearance: She is well-developed. She is obese.  HENT:     Head: Normocephalic and atraumatic.     Right Ear: Tympanic membrane normal.     Left Ear: Tympanic membrane normal.  Eyes:     Pupils: Pupils are equal, round, and reactive to light.  Neck:     Thyroid : No thyromegaly.  Cardiovascular:     Rate and Rhythm: Normal rate and regular rhythm.     Heart sounds: Normal heart sounds. No murmur heard. Pulmonary:     Effort: Pulmonary effort is normal. No respiratory distress.     Breath sounds: Decreased breath sounds and wheezing present.  Abdominal:     General: Bowel sounds are normal. There is no distension.     Palpations: Abdomen is soft.     Tenderness: There is no abdominal tenderness.  Musculoskeletal:        General: No tenderness. Normal range of motion.     Cervical back: Normal range of motion and neck supple.     Right lower leg: Edema (3+) present.     Left lower leg: Edema (3+) present.  Skin:    General: Skin is warm and dry.  Neurological:     Mental Status: She is alert and oriented to person, place, and time.     Cranial Nerves: No cranial nerve deficit.     Deep Tendon Reflexes: Reflexes are normal and symmetric.  Psychiatric:        Behavior: Behavior normal.        Thought Content: Thought content normal.        Judgment: Judgment normal.        BP 109/67   Pulse 96   Temp 98 F (36.7 C) (Temporal)   SpO2 90%      Assessment & Plan:  Savannah  G Burns comes in today with chief complaint of Cough and Shortness of Breath ( 3-4Days- on 3 litters o2 thinks she has  been running a fever chills body )   Diagnosis and orders addressed:  1. SOB (shortness of breath) (Primary) - DG Chest 2 View; Future - albuterol  (PROVENTIL ) (2.5 MG/3ML) 0.083% nebulizer solution; Take 3 mLs (2.5 mg total) by nebulization every 6 (six) hours as needed for wheezing or shortness of breath.  Dispense: 150 mL; Refill: 1 - albuterol  (VENTOLIN  HFA) 108 (90 Base) MCG/ACT inhaler; Inhale 2 puffs into the lungs every 6 (six) hours as needed for wheezing or shortness of breath.  Dispense: 8 g; Refill: 0 - doxycycline  (VIBRA -TABS) 100 MG tablet; Take 1 tablet (100 mg total) by mouth 2 (two) times daily.  Dispense: 20 tablet; Refill: 0 - predniSONE  (STERAPRED UNI-PAK 21 TAB) 10 MG (21) TBPK tablet; Use as directed  Dispense: 21 tablet; Refill: 0  2. Wheezing - DG Chest 2 View; Future - albuterol  (PROVENTIL ) (2.5 MG/3ML) 0.083% nebulizer solution; Take 3 mLs (2.5 mg total) by nebulization every 6 (six) hours as needed for wheezing or shortness of breath.  Dispense: 150 mL; Refill: 1 - albuterol  (VENTOLIN  HFA) 108 (90 Base) MCG/ACT inhaler; Inhale 2 puffs into the lungs every 6 (six) hours as needed for wheezing or shortness of breath.  Dispense: 8 g; Refill: 0 - doxycycline  (VIBRA -TABS) 100 MG tablet; Take 1 tablet (100 mg total) by mouth 2 (two) times daily.  Dispense: 20 tablet; Refill: 0 - predniSONE  (STERAPRED UNI-PAK 21 TAB) 10 MG (21) TBPK tablet; Use as directed  Dispense: 21 tablet; Refill: 0  3. COPD exacerbation (HCC - DG Chest 2 View; Future - albuterol  (PROVENTIL ) (2.5 MG/3ML) 0.083% nebulizer solution; Take 3 mLs (2.5 mg total) by nebulization every 6 (six) hours as needed for wheezing or shortness of breath.  Dispense: 150 mL; Refill: 1 -  albuterol  (VENTOLIN  HFA) 108 (90 Base) MCG/ACT inhaler; Inhale 2 puffs into the lungs every 6 (six) hours as needed for wheezing or shortness of breath.  Dispense: 8 g; Refill: 0 - doxycycline  (VIBRA -TABS) 100 MG tablet; Take 1 tablet (100 mg total) by mouth 2 (two) times daily.  Dispense: 20 tablet; Refill: 0 - predniSONE  (STERAPRED UNI-PAK 21 TAB) 10 MG (21) TBPK tablet; Use as directed  Dispense: 21 tablet; Refill: 0   Refuses Flu swab today Recommend her to take home COVID test to rule out  X-ray pending  Rest Albuterol  every 6 hours, a lot of wheezing noted Will give prednisone  to start. Does not take dexamethasone  regularly, only on chemotherapy days.   Bari Learn, FNP

## 2023-05-04 NOTE — Patient Instructions (Signed)

## 2023-05-08 ENCOUNTER — Telehealth: Payer: Self-pay

## 2023-05-08 ENCOUNTER — Telehealth: Payer: Self-pay | Admitting: Family Medicine

## 2023-05-08 ENCOUNTER — Other Ambulatory Visit: Payer: Self-pay | Admitting: Hematology

## 2023-05-08 DIAGNOSIS — R0602 Shortness of breath: Secondary | ICD-10-CM

## 2023-05-08 DIAGNOSIS — C9000 Multiple myeloma not having achieved remission: Secondary | ICD-10-CM

## 2023-05-08 NOTE — Telephone Encounter (Signed)
 Patient granddaughter called she is aware its not back yet. She is asking when it will come back she states grandmother is really sick can we check on status please?

## 2023-05-08 NOTE — Telephone Encounter (Signed)
 Sent the machine in for patient she is aware

## 2023-05-08 NOTE — Telephone Encounter (Signed)
 Copied from CRM [id number]. Topic: Clinical - Lab/Test Results >> May 08, 2023  1:19 PM Carlatta H wrote: Reason for CRM: Please call patients grand daughter [phone] with xray results

## 2023-05-08 NOTE — Telephone Encounter (Signed)
 Copied from CRM [id number]. Topic: Clinical - Prescription Issue >> May 08, 2023  2:45 PM Savannah Burns wrote: Reason for CRM: Patient got prescription for breathing treatment but it does not fit the machine they have. They are needing the machine that goes with the medication they picked up sent over to their usually pharmacy in [place]. The patient is unable to get the treatment until they get the machine. Please advise.

## 2023-05-09 ENCOUNTER — Other Ambulatory Visit: Payer: Self-pay

## 2023-05-09 ENCOUNTER — Emergency Department (HOSPITAL_COMMUNITY)
Admission: EM | Admit: 2023-05-09 | Discharge: 2023-05-09 | Disposition: A | Discharge status: Home / Self Care | Payer: Medicare PPO | Attending: Emergency Medicine | Admitting: Emergency Medicine

## 2023-05-09 ENCOUNTER — Encounter (HOSPITAL_COMMUNITY): Payer: Self-pay

## 2023-05-09 ENCOUNTER — Emergency Department (HOSPITAL_COMMUNITY): Payer: Medicare PPO

## 2023-05-09 DIAGNOSIS — R0989 Other specified symptoms and signs involving the circulatory and respiratory systems: Secondary | ICD-10-CM | POA: Diagnosis not present

## 2023-05-09 DIAGNOSIS — R0602 Shortness of breath: Secondary | ICD-10-CM | POA: Diagnosis not present

## 2023-05-09 DIAGNOSIS — J101 Influenza due to other identified influenza virus with other respiratory manifestations: Secondary | ICD-10-CM | POA: Diagnosis not present

## 2023-05-09 DIAGNOSIS — R059 Cough, unspecified: Secondary | ICD-10-CM | POA: Diagnosis present

## 2023-05-09 DIAGNOSIS — I509 Heart failure, unspecified: Secondary | ICD-10-CM | POA: Diagnosis not present

## 2023-05-09 DIAGNOSIS — Z8579 Personal history of other malignant neoplasms of lymphoid, hematopoietic and related tissues: Secondary | ICD-10-CM | POA: Diagnosis not present

## 2023-05-09 DIAGNOSIS — J441 Chronic obstructive pulmonary disease with (acute) exacerbation: Secondary | ICD-10-CM | POA: Insufficient documentation

## 2023-05-09 DIAGNOSIS — D72819 Decreased white blood cell count, unspecified: Secondary | ICD-10-CM | POA: Diagnosis not present

## 2023-05-09 DIAGNOSIS — Z9981 Dependence on supplemental oxygen: Secondary | ICD-10-CM | POA: Insufficient documentation

## 2023-05-09 DIAGNOSIS — J811 Chronic pulmonary edema: Secondary | ICD-10-CM | POA: Diagnosis not present

## 2023-05-09 DIAGNOSIS — C9000 Multiple myeloma not having achieved remission: Secondary | ICD-10-CM

## 2023-05-09 DIAGNOSIS — R918 Other nonspecific abnormal finding of lung field: Secondary | ICD-10-CM | POA: Diagnosis not present

## 2023-05-09 LAB — COMPREHENSIVE METABOLIC PANEL
ALT: 16 U/L (ref 0–44)
AST: 17 U/L (ref 15–41)
Albumin: 3 g/dL — ABNORMAL LOW (ref 3.5–5.0)
Alkaline Phosphatase: 89 U/L (ref 38–126)
Anion gap: 7 (ref 5–15)
BUN: 11 mg/dL (ref 8–23)
CO2: 32 mmol/L (ref 22–32)
Calcium: 8.3 mg/dL — ABNORMAL LOW (ref 8.9–10.3)
Chloride: 102 mmol/L (ref 98–111)
Creatinine, Ser: 0.51 mg/dL (ref 0.44–1.00)
GFR, Estimated: >60 mL/min (ref >60)
Glucose, Bld: 145 mg/dL — ABNORMAL HIGH (ref 70–99)
Potassium: 3.1 mmol/L — ABNORMAL LOW (ref 3.5–5.1)
Sodium: 141 mmol/L (ref 135–145)
Total Bilirubin: 0.8 mg/dL (ref 0.0–1.2)
Total Protein: 5.6 g/dL — ABNORMAL LOW (ref 6.5–8.1)

## 2023-05-09 LAB — CBC WITH DIFFERENTIAL/PLATELET
Abs Immature Granulocytes: 0.02 10*3/uL (ref 0.00–0.07)
Basophils Absolute: 0 10*3/uL (ref 0.0–0.1)
Basophils Relative: 0 %
Eosinophils Absolute: 0 10*3/uL (ref 0.0–0.5)
Eosinophils Relative: 0 %
HCT: 35.3 % — ABNORMAL LOW (ref 36.0–46.0)
Hemoglobin: 10.9 g/dL — ABNORMAL LOW (ref 12.0–15.0)
Immature Granulocytes: 1 %
Lymphocytes Relative: 30 %
Lymphs Abs: 1 10*3/uL (ref 0.7–4.0)
MCH: 33.2 pg (ref 26.0–34.0)
MCHC: 30.9 g/dL (ref 30.0–36.0)
MCV: 107.6 fL — ABNORMAL HIGH (ref 80.0–100.0)
Monocytes Absolute: 0.3 10*3/uL (ref 0.1–1.0)
Monocytes Relative: 7 %
Neutro Abs: 2.1 10*3/uL (ref 1.7–7.7)
Neutrophils Relative %: 62 %
Platelets: 52 10*3/uL — ABNORMAL LOW (ref 150–400)
RBC: 3.28 MIL/uL — ABNORMAL LOW (ref 3.87–5.11)
RDW: 14.3 % (ref 11.5–15.5)
Smear Review: DECREASED
WBC: 3.4 10*3/uL — ABNORMAL LOW (ref 4.0–10.5)
nRBC: 0 % (ref 0.0–0.2)

## 2023-05-09 LAB — TROPONIN I (HIGH SENSITIVITY)
Troponin I (High Sensitivity): 8 ng/L (ref <18)
Troponin I (High Sensitivity): 8 ng/L (ref <18)

## 2023-05-09 LAB — RESP PANEL BY RT-PCR (RSV, FLU A&B, COVID)  RVPGX2
Influenza A by PCR: POSITIVE — AB
Influenza B by PCR: NEGATIVE
Resp Syncytial Virus by PCR: NEGATIVE
SARS Coronavirus 2 by RT PCR: NEGATIVE

## 2023-05-09 LAB — BRAIN NATRIURETIC PEPTIDE: B Natriuretic Peptide: 154 pg/mL — ABNORMAL HIGH (ref 0.0–100.0)

## 2023-05-09 MED ORDER — METHYLPREDNISOLONE SODIUM SUCC 125 MG IJ SOLR
125.0000 mg | Freq: Once | INTRAMUSCULAR | Status: AC
  Administered 2023-05-09: 125 mg via INTRAMUSCULAR
  Filled 2023-05-09: qty 2

## 2023-05-09 MED ORDER — POTASSIUM CHLORIDE CRYS ER 20 MEQ PO TBCR
40.0000 meq | EXTENDED_RELEASE_TABLET | Freq: Once | ORAL | Status: AC
  Administered 2023-05-09: 40 meq via ORAL
  Filled 2023-05-09: qty 2

## 2023-05-09 MED ORDER — TORSEMIDE 20 MG PO TABS
40.0000 mg | ORAL_TABLET | Freq: Once | ORAL | Status: AC
  Administered 2023-05-09: 40 mg via ORAL
  Filled 2023-05-09: qty 2

## 2023-05-09 MED ORDER — LENALIDOMIDE 15 MG PO CAPS: ORAL_CAPSULE | ORAL | 0 refills | Status: DC

## 2023-05-09 MED ORDER — AZITHROMYCIN 250 MG PO TABS: 250.0000 mg | ORAL_TABLET | Freq: Every day | ORAL | 0 refills | Status: DC

## 2023-05-09 MED ORDER — METHYLPREDNISOLONE SODIUM SUCC 125 MG IJ SOLR: 125.0000 mg | Freq: Once | INTRAMUSCULAR | Status: DC

## 2023-05-09 MED ORDER — IPRATROPIUM-ALBUTEROL 0.5-2.5 (3) MG/3ML IN SOLN
3.0000 mL | Freq: Once | RESPIRATORY_TRACT | Status: AC
  Administered 2023-05-09: 3 mL via RESPIRATORY_TRACT
  Filled 2023-05-09: qty 3

## 2023-05-09 MED ORDER — AZITHROMYCIN 250 MG PO TABS
500.0000 mg | ORAL_TABLET | Freq: Once | ORAL | Status: AC
  Administered 2023-05-09: 500 mg via ORAL
  Filled 2023-05-09: qty 2

## 2023-05-09 MED ORDER — TORSEMIDE 20 MG PO TABS: 40.0000 mg | ORAL_TABLET | Freq: Every day | ORAL | Status: DC

## 2023-05-09 MED ORDER — AMOXICILLIN-POT CLAVULANATE 875-125 MG PO TABS: 1.0000 | ORAL_TABLET | Freq: Two times a day (BID) | ORAL | 0 refills | Status: DC

## 2023-05-09 NOTE — ED Triage Notes (Signed)
Pt arrived via POV from home c/o worsening Sob, cough since last week. Pt reports going to her PCP office and did have an xray, but the Xray has not resulted yet. Pt reports she has been unable to get her Neb Treatment machine for breathing treatments at home.

## 2023-05-09 NOTE — ED Provider Notes (Signed)
[place] EMERGENCY DEPARTMENT AT [facility] Provider Note   CSN: [id number] Arrival date & time: 05/09/23  1103     History  Chief Complaint  Patient presents with   Cough    Savannah Burns is a 73 y.o. female.  He has PMH of COPD, on 3 L home oxygen, multiple myeloma currently on chemotherapy.  Presents to ER for cough and shortness of breath with increased wheezing for a week and a half.  She was seen by her primary care doctor and given prednisone which she has not picked up yet.  She has not been able to do her nebulizer treatments at home because her nebulizer was missing parts, she is already arranged to pick up what she needs at home later today.  Denies fever, cough is occasionally productive of clear mucus.  She had a breathing treatment while waiting here in the ED today and states that had   Cough      Home Medications Prior to Admission medications   Medication Sig Start Date End Date Taking? Authorizing Provider  amoxicillin-clavulanate (AUGMENTIN) 875-125 MG tablet Take 1 tablet by mouth every 12 (twelve) hours. 05/09/23  Yes Kristopher Attwood A, PA-C  acyclovir (ZOVIRAX) 400 MG tablet Take 1 tablet (400 mg total) by mouth 2 (two) times daily. 11/30/21   Doreatha Massed, MD  albuterol (PROVENTIL) (2.5 MG/3ML) 0.083% nebulizer solution Take 3 mLs (2.5 mg total) by nebulization every 6 (six) hours as needed for wheezing or shortness of breath. 05/04/23   Junie Spencer, FNP  albuterol (VENTOLIN HFA) 108 (90 Base) MCG/ACT inhaler Inhale 2 puffs into the lungs every 6 (six) hours as needed for wheezing or shortness of breath. 05/04/23   Junie Spencer, FNP  baclofen (LIORESAL) 10 MG tablet Take 10 mg by mouth 2 (two) times daily. 03/03/22   [provider]  Blood Glucose Calibration (TRUE METRIX LEVEL 1) Low SOLN Use w/ glucose monitor Dx E11.9 09/01/21   Delynn Flavin M, DO  Blood Glucose Monitoring Suppl (TRUE METRIX AIR GLUCOSE METER) w/Device  KIT Check BS daily Dx E11.9 09/01/21   Delynn Flavin M, DO  Cholecalciferol (VITAMIN D) 50 MCG (2000 UT) tablet Take 2,000 Units by mouth daily.    [provider]  clindamycin (CLEOCIN T) 1 % SWAB Apply to affected areas under axilla twice daily 12/02/22   Delynn Flavin M, DO  clobetasol (TEMOVATE) 0.05 % external solution Apply 1 Application topically 2 (two) times daily. 01/24/22   [provider]  Cyanocobalamin (B-12 COMPLIANCE INJECTION) 1000 MCG/ML KIT Inject 1,000 mcg as directed every 30 (thirty) days.    [provider]  desvenlafaxine (PRISTIQ) 100 MG 24 hr tablet Take 100 mg by mouth in the morning. 01/26/23   [provider]  dexamethasone (DECADRON) 4 MG tablet Take 5 tablets once every 4 weeks prior to darzalex 02/09/23   Doreatha Massed, MD  doxycycline (VIBRA-TABS) 100 MG tablet Take 1 tablet (100 mg total) by mouth 2 (two) times daily. 05/04/23   Junie Spencer, FNP  esomeprazole (NEXIUM) 40 MG capsule Take 1 capsule (40 mg total) by mouth daily. 10/03/22   Vassie Loll, MD  ferrous sulfate 325 (65 FE) MG tablet Take 1 tablet (325 mg total) by mouth daily with breakfast. 02/22/22 09/27/22  Doreatha Massed, MD  fluticasone-salmeterol (ADVAIR) 100-50 MCG/ACT AEPB Inhale 1 puff into the lungs 2 (two) times daily. 03/07/23   [provider]  glucose blood (TRUE METRIX BLOOD  GLUCOSE TEST) test strip Check BS daily Dx E11.9 09/01/21   Delynn Flavin M, DO  lenalidomide (REVLIMID) 15 MG capsule TAKE 1 CAPSULE BY MOUTH EVERY DAY FOR 21 DAYS ON THEN 7 DAYS OFF 05/09/23   Doreatha Massed, MD  lidocaine-prilocaine (EMLA) cream Apply 1 Application topically as needed. Apply a small amount to port a cath site and cover with plastic wrap 1 hour prior to infusion appointments 04/12/23   Pappayliou, Santina Evans A, DO  lovastatin (MEVACOR) 20 MG tablet Take 1 tablet (20 mg total) by mouth at bedtime. 10/30/19   Raliegh Ip, DO   mupirocin ointment (BACTROBAN) 2 % Apply 1 Application topically 2 (two) times daily. 10/25/22   Blanchard Kelch, NP  nystatin (MYCOSTATIN/NYSTOP) powder Apply topically 2 (two) times daily. 01/30/23   Dettinger, Elige Radon, MD  oxyCODONE-acetaminophen (PERCOCET) 10-325 MG tablet Take 1 tablet by mouth every 6 (six) hours as needed for pain.    [provider]  OXYGEN Inhale 3 L into the lungs continuous.    [provider]  OZEMPIC, 0.25 OR 0.5 MG/DOSE, 2 MG/3ML SOPN Inject 0.25 mg into the skin every 7 (seven) days. 12/02/22   Raliegh Ip, DO  potassium chloride SA (KLOR-CON) 20 MEQ tablet Take 2 tablets (40 mEq total) by mouth 2 (two) times daily. Patient taking differently: Take 40 mEq by mouth daily. 04/24/20   Raliegh Ip, DO  predniSONE (STERAPRED UNI-PAK 21 TAB) 10 MG (21) TBPK tablet Use as directed 05/04/23   Jannifer Rodney A, FNP  prochlorperazine (COMPAZINE) 10 MG tablet Take 1 tablet (10 mg total) by mouth every 6 (six) hours as needed for nausea or vomiting. 12/14/21   Doreatha Massed, MD  torsemide (DEMADEX) 20 MG tablet Take 2.5 tablets (50 mg total) by mouth 2 (two) times daily. 10/03/22 01/01/23  Vassie Loll, MD  TRUEplus Lancets 33G MISC Check BS daily Dx E11.9 09/01/21   Raliegh Ip, DO  venlafaxine XR (EFFEXOR-XR) 150 MG 24 hr capsule Take 1 capsule (150 mg total) by mouth daily with breakfast. 10/04/22   Vassie Loll, MD      Allergies    Keflex [cephalexin]    Review of Systems   Review of Systems  Respiratory:  Positive for cough.     Physical Exam Updated Vital Signs BP (!) 142/79   Pulse 82   Temp 98.9 F (37.2 C) (Oral)   Resp 18   Ht 5\' 2"  (1.575 m)   Wt (!) 138 kg   SpO2 98%   BMI 55.65 kg/m  Physical Exam Vitals and nursing note reviewed.  Constitutional:      General: She is not in acute distress.    Appearance: She is well-developed.  HENT:     Head: Normocephalic and atraumatic.     Mouth/Throat:      Mouth: Mucous membranes are moist.  Eyes:     Conjunctiva/sclera: Conjunctivae normal.  Cardiovascular:     Rate and Rhythm: Normal rate and regular rhythm.     Heart sounds: No murmur heard. Pulmonary:     Effort: Pulmonary effort is normal. No respiratory distress.     Breath sounds: Wheezing present.     Comments: Moderate diffuse inspiratory and expiratory wheezes Abdominal:     Palpations: Abdomen is soft.     Tenderness: There is no abdominal tenderness.  Musculoskeletal:        General: No swelling.     Cervical back: Neck supple.  Skin:  General: Skin is warm and dry.     Capillary Refill: Capillary refill takes less than 2 seconds.  Neurological:     Mental Status: She is alert.  Psychiatric:        Mood and Affect: Mood normal.     ED Results / Procedures / Treatments   Labs (all labs ordered are listed, but only abnormal results are displayed) Labs Reviewed  RESP PANEL BY RT-PCR (RSV, FLU A&B, COVID)  RVPGX2 - Abnormal; Notable for the following components:      Result Value   Influenza A by PCR POSITIVE (*)    All other components within normal limits  COMPREHENSIVE METABOLIC PANEL - Abnormal; Notable for the following components:   Potassium 3.1 (*)    Glucose, Bld 145 (*)    Calcium 8.3 (*)    Total Protein 5.6 (*)    Albumin 3.0 (*)    All other components within normal limits  CBC WITH DIFFERENTIAL/PLATELET - Abnormal; Notable for the following components:   WBC 3.4 (*)    RBC 3.28 (*)    Hemoglobin 10.9 (*)    HCT 35.3 (*)    MCV 107.6 (*)    Platelets 52 (*)    All other components within normal limits  BRAIN NATRIURETIC PEPTIDE - Abnormal; Notable for the following components:   B Natriuretic Peptide 154.0 (*)    All other components within normal limits  TROPONIN I (HIGH SENSITIVITY)  TROPONIN I (HIGH SENSITIVITY)    EKG EKG Interpretation Date/Time:  Tuesday May 09 2023 12:41:38 EST Ventricular Rate:  75 PR Interval:  146 QRS  Duration:  144 QT Interval:  437 QTC Calculation: 489 R Axis:   52  Text Interpretation: Sinus rhythm Right bundle branch block No acute changes Nonspecific ST abnormality No significant change since last tracing Confirmed by Derwood Kaplan [phone]) on 05/09/2023 1:00:58 PM  Radiology DG Chest 2 View Result Date: 05/09/2023 CLINICAL DATA:  Shortness of breath. EXAM: CHEST - 2 VIEW COMPARISON:  05/04/2023. FINDINGS: Diffuse mild-to-moderate pulmonary vascular congestion/edema. There are left retrocardiac airspace opacity partially obscuring the left medial hemidiaphragm and descending thoracic aorta, suggesting combination of left lung atelectasis and/or consolidation. Bilateral lung fields are otherwise clear. Bilateral costophrenic angles are clear. Stable cardio-mediastinal silhouette. No acute osseous abnormalities. The soft tissues are within normal limits. Right-sided CT Port-A-Cath is seen with its tip overlying the upper portion of superior vena cava. IMPRESSION: *Findings favor congestive heart failure/pulmonary edema. *There is additional left retrocardiac opacity, as described above. Electronically Signed   By: Jules Schick M.D.   On: 05/09/2023 13:58    Procedures Procedures    Medications Ordered in ED Medications  ipratropium-albuterol (DUONEB) 0.5-2.5 (3) MG/3ML nebulizer solution 3 mL (3 mLs Nebulization Given 05/09/23 1245)  ipratropium-albuterol (DUONEB) 0.5-2.5 (3) MG/3ML nebulizer solution 3 mL (3 mLs Nebulization Given 05/09/23 1347)  methylPREDNISolone sodium succinate (SOLU-MEDROL) 125 mg/2 mL injection 125 mg (125 mg Intramuscular Given 05/09/23 1354)  potassium chloride SA (KLOR-CON M) CR tablet 40 mEq (40 mEq Oral Given 05/09/23 1521)  torsemide (DEMADEX) tablet 40 mg (40 mg Oral Given 05/09/23 1800)  azithromycin (ZITHROMAX) tablet 500 mg (500 mg Oral Given 05/09/23 1832)    ED Course/ Medical Decision Making/ A&P                                 Medical Decision  Making This patient presents to the  ED for concern of cough, shortness of breath, wheezing, this involves an extensive number of treatment options, and is a complaint that carries with it a high risk of complications and morbidity.  The differential diagnosis includes COPD exacerbation, influenza,, COVID, pneumonia, CHF, other   Co morbidities that complicate the patient evaluation :   CHF, COPD, chronic respiratory failure on 3 L of oxygen at baseline   Additional history obtained:  Additional history obtained from EMR External records from outside source obtained and reviewed including notes and labs   Lab Tests:  I Ordered, and personally interpreted labs.  The pertinent results include: Troponin negative x 2, BNP is only slightly elevated 154 which is around patient's usual baseline, CBC showed mild leukopenia with white blood cell count of 3.4, hemoglobin is 10.9   Imaging Studies ordered:  I ordered imaging studies including chest x-ray which shows mild pulmonary edema with retrocardiac infiltrate I independently visualized and interpreted imaging within scope of identifying emergent findings  I agree with the radiologist interpretation   Cardiac Monitoring: / EKG:  The patient was maintained on a cardiac monitor.  I personally viewed and interpreted the cardiac monitored which showed an underlying rhythm of: Sinus rhythm     Problem List / ED Course / Critical interventions / Medication management  Shortness of breath and wheezing-patient has COPD, seen by PCP recently but was not taking the steroids or antibiotics as prescribed.  Also did not have ability to do nebs at home as she did not have all the pieces for nebulizer machine, she is able to get these today however.  She has had much better after DuoNebs and steroids.  Chest x-ray did show some pulmonary edema as well and she has been having some increased lower extremity edema, given dose of torsemide here has urinated  multiple times since., advised to double her diuretic dose at home for the next 2 to 3 days and also double her potassium at home for the next 2 to 3 days as her potassium was low today which we also repleted.  To follow close with her heart doctor and PCP.  Will treat with antibiotics for retrocardiac infiltrate, given her low comorbidities will treat with dual therapy, given Augmentin and advised to take her doxycycline at home.  I sent a prescription for azithromycin originally which I given her in the ED until she told me that she had doxycycline at home and advised her she could take the doxycycline and says that she already had that.  Advised to not pick up the azithromycin if she is going to take the doxycycline.  Oxygen saturation is between 98 and 99% on her home 3 L   Is positive for influenza but symptoms have been present for over a week so out of window for Tamiflu treatment.   I have reviewed the patients home medicines and have made adjustments as needed   Social Determinants of Health:  Lives at home, non-smoker   Test / Admission - Considered:  Consider admission, at this time feel patient is safe to go home.  Patient agreeable with discharge home    Amount and/or Complexity of Data Reviewed Radiology: ordered.  Risk Prescription drug management.           Final Clinical Impression(s) / ED Diagnoses Final diagnoses:  Influenza A  Chronic obstructive pulmonary disease with acute exacerbation (HCC)    Rx / DC Orders ED Discharge Orders  Ordered    azithromycin (ZITHROMAX) 250 MG tablet  Daily,   Status:  Discontinued        05/09/23 1825    amoxicillin-clavulanate (AUGMENTIN) 875-125 MG tablet  Every 12 hours        05/09/23 1825              Josem Kaufmann 05/09/23 1918    Derwood Kaplan, MD 05/12/23 1023

## 2023-05-09 NOTE — Discharge Instructions (Signed)
You are seen in the ER for cough, wheezing and shortness of breath.  You were given breathing treatments with improvement of your symptoms.  Please take the prednisone you are prescribed by your doctor.  Continue the doxycycline as prescribed.  I am also adding another antibiotic, Augmentin as your chest x-ray showed what looked like a small pneumonia.  Chest x-ray also showed some fluid in your lungs.  Since you are also having swelling in your legs please double your fluid pills for the next 3 days.  Potassium was slightly low as well so is important to double your potassium for the next 3 days as well.  These follow-up closely with your heart doctor and your primary care doctor.  Come back to the ER if you have new or worsening symptoms.

## 2023-05-09 NOTE — ED Provider Triage Note (Signed)
Emergency Medicine Provider Triage Evaluation Note  Savannah Burns , a 73 y.o. female  was evaluated in triage.  Pt complains of increased shortness of breath, cough and congestion which has been ongoing for past for the past week.  Patient notes that she is concerned that she may have pneumonia.  Family also notes that she has been experiencing dark stools.  Review of Systems  Positive: Cough, congestion, shortness of breath and wheezing, diarrhea Negative: Abdominal pain, nausea, vomiting  Physical Exam  BP (!) 145/80   Pulse 82   Temp 99.5 F (37.5 C) (Oral)   Resp 18   Ht 5\' 2"  (1.575 m)   Wt (!) 138 kg   SpO2 96%   BMI 55.65 kg/m  Gen:   Awake, no distress  \ Resp:  Normal effort, diffuse expiratory wheezing MSK:   Moves extremities without difficulty  Other:  Abdominal exam with no focal tenderness throughout  Medical Decision Making  Medically screening exam initiated at 12:12 PM.  Appropriate orders placed.  Savannah Burns was informed that the remainder of the evaluation will be completed by another provider, this initial triage assessment does not replace that evaluation, and the importance of remaining in the ED until their evaluation is complete.  Labs and imaging have been ordered for patient.  Breathing treatment ordered as well.  Awaiting bed in the back at this time.   Lelon Perla, PA-C 05/09/23 1213

## 2023-05-09 NOTE — Telephone Encounter (Signed)
Called 05/08/2023 for reading on xrays

## 2023-05-10 ENCOUNTER — Telehealth: Payer: Self-pay

## 2023-05-10 NOTE — Progress Notes (Incomplete)
[facility] [address], [place] [address]    Clinic Day:  05/10/2023  Referring physician: Raliegh Ip, DO  Patient Care Team: Raliegh Ip, DO as PCP - General (Family Medicine) Wyline Mood Dorothe Pea, MD as PCP - Cardiology (Cardiology) Letta Kocher, MD (Rehabilitation) Coralyn Helling, MD (Inactive) as Consulting Physician (Pulmonary Disease) Lanelle Bal, DO as Consulting Physician (Internal Medicine) Merryl Hacker, NP as Nurse Practitioner (Nurse Practitioner) Bonna Gains, MD as Referring Physician (Gastroenterology) Doreatha Massed, MD as Medical Oncologist (Medical Oncology) Therese Sarah, RN as Oncology Nurse Navigator (Medical Oncology) Clinton Gallant, RN as [facility] Care Management   ASSESSMENT & PLAN:   Assessment: 1. 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  Orderly maturation of erythroid and myeloid series. - Myeloma FISH panel: t(11;14) - Cytogenetics: Failed to grow metaphases - PET scan (11/18/2021): No evidence of active myeloma or plasmacytoma.  Right upper lobe lung infection. - Skull x-rays (10/29/2021) multiple discrete round lucent/lytic lesions in the skull - Worsening M spike and free light chain ratio.  Normal creatinine and calcium.  Multifactorial anemia including blood loss and bone marrow infiltration. - Cycle 1 of DRd started on 12/14/2021 with lenalidomide 15 mg 3 weeks on/1 week off and dexamethasone 20 mg weekly - Hospitalized from 09/26/2022 through 10/03/2022 with MSSA bacteremia, port removed, cefepime started, to continue until 10/26/2022   2.  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  Quit smoking 15 years ago.  No exposure to chemicals or pesticides. - Paternal grandmother had ovarian cancer.  Sister had ovarian cancer.   3.  Cirrhosis: -  Likely secondary to NAFLD with splenomegaly resulting in mild thrombocytopenia.  Seen by transplant hepatology service at [facility].    Plan: 1.  Stage II IgG kappa myeloma, standard risk: - She is tolerating monthly daratumumab reasonably well.  She is continuing Revlimid 15 mg 3 weeks on/1 week off. - Denies any hospitalizations or infections. - Reviewed labs from 02/09/2023: Platelet count 85K, white count 3.9.  Hemoglobin is normal.  Myeloma labs from 01/12/2023: M spike stable at 0.3.  Kappa light chains 15.9 and ratio of 3.24. - Continue monthly Darzalex.  Decrease dexamethasone to 20 mg monthly on the day of Darzalex.  Continue Revlimid 15 mg 3 weeks on/1 week off. - We will discontinue PICC line and place a port. - RTC 12 weeks for follow-up with repeat myeloma labs.   2.  Iron deficiency anemia from chronic GI bleed: - She is receiving Venofer 500 mg every 2 weeks.  Hemoglobin has been stable between 12 and 13 for the last 6 times.  Ferritin today is 578 and percent saturation 136.  Last ferritin was 441. - Will switch Venofer to 500 mg every 4 weeks.   3.  Infection prophylaxis: - Continue acyclovir 400 mg twice daily.  Aspirin on hold for bleeding.   4.  Cirrhosis: - Continue Lasix 20 mg daily, carvedilol 6.5 mg daily and lactulose daily.   5.  Peripheral neuropathy: - This is mostly in the feet and stable.    No orders of the defined types were placed in this encounter.     Alben Deeds Teague,acting as a Neurosurgeon for Doreatha Massed, MD.,have documented all relevant documentation on the behalf of Doreatha Massed, MD,as directed by  Doreatha Massed, MD while in  the presence of Doreatha Massed, MD.  ***    [address] R Teague   2/12/20258:41 AM  CHIEF COMPLAINT:   Diagnosis: stage II standard risk IgG kappa plasma cell myeloma    Cancer Staging  Multiple myeloma (HCC) Staging form: Plasma Cell Myeloma and Plasma Cell Disorders, AJCC 8th Edition - Clinical  stage from 11/30/2021: RISS Stage II (Beta-2-microglobulin (mg/L): 2.8, Albumin (g/dL): 3, ISS: Stage II, High-risk cytogenetics: Absent, LDH: Normal) - Signed by Doreatha Massed, MD on 11/30/2021    Prior Therapy: none  Current Therapy:  Daratumumab, lenalidomide and dexamethasone    HISTORY OF PRESENT ILLNESS:   Oncology History  Multiple myeloma (HCC)  11/30/2021 Initial Diagnosis   Multiple myeloma (HCC)   11/30/2021 Cancer Staging   Staging form: Plasma Cell Myeloma and Plasma Cell Disorders, AJCC 8th Edition - Clinical stage from 11/30/2021: RISS Stage II (Beta-2-microglobulin (mg/L): 2.8, Albumin (g/dL): 3, ISS: Stage II, High-risk cytogenetics: Absent, LDH: Normal) - Signed by Doreatha Massed, MD on 11/30/2021 Histopathologic type: Multiple myeloma Stage prefix: Initial diagnosis Beta 2 microglobulin range (mg/L): Less than 3.5 Albumin range (g/dL): Less than 3.5 Cytogenetics: t(11;14) translocation Serum calcium level: Normal   12/14/2021 -  Chemotherapy   Patient is on Treatment Plan : MYELOMA  Daratumumab SQ + Lenalidomide + Dexamethasone (DaraRd) q28d        INTERVAL HISTORY:   Savannah Burns is a 73 y.o. female presenting to clinic today for follow up of stage II standard risk IgG kappa plasma cell myeloma. She was last seen by me on 02/09/23.  Since her last visit, she had her port inserted on 04/12/23 with Dr. Robyne Peers. Savannah Burns presented to the ED on 05/09/23 and tested positive for Influenza A. She was prescribed Augmentin.   Today, she states that she is doing well overall. Her appetite level is at ***%. Her energy level is at ***%.  PAST MEDICAL HISTORY:   Past Medical History: Past Medical History:  Diagnosis Date   Adrenal adenoma, left    Stable   Anxiety    Arthritis    bilateral hands   COPD (chronic obstructive pulmonary disease) (HCC)    Depression    Diabetes mellitus, type 2 (HCC) 08/12/2008   Qualifier: Diagnosis of  By: Dayton Martes MD, Talia      Dyspnea    Esophageal varices (HCC)    Grade II diastolic dysfunction    History of kidney stones    Hyperlipidemia    Hypertension    Lower back pain    Lower GI bleed 03/19/2020   Panic attacks    Pneumonia    currently taking antibiotic and prednisone for early stages of pneumonia   Pulmonary nodules    bilateral   Skin cancer    face    Surgical History: Past Surgical History:  Procedure Laterality Date   BIOPSY  04/07/2020   Procedure: BIOPSY;  Surgeon: Lanelle Bal, DO;  [facility];  Service: Endoscopy;;   Breast Cystectomy  Right    CESAREAN SECTION     COLONOSCOPY WITH PROPOFOL N/A 01/25/2020   Dr. Marletta Lor: Nonbleeding internal hemorrhoids, diverticulosis, 5 mm polyp removed from the ascending colon, 10 mm polyp removed from the sigmoid colon, 30 mm polyp (tubulovillous adenoma with no high-grade dysplasia) removed from the transverse colon via piecemeal status post tattoo.  Other polyps were tubular adenomas.  3 month surveillance colonoscopy recommended.   COLONOSCOPY WITH PROPOFOL N/A 04/07/2020   Procedure: COLONOSCOPY WITH PROPOFOL;  Surgeon: Earnest Bailey  K, DO;  [facility]: [facility];  Service: Endoscopy;  Laterality: N/A;  3:00pm, pt knows new time per office   CYSTOSCOPY/URETEROSCOPY/HOLMIUM LASER/STENT PLACEMENT Bilateral 03/01/2019   Procedure: CYSTOSCOPY/RETROGRADEURETEROSCOPY/HOLMIUM LASER/STENT PLACEMENT;  Surgeon: Rene Paci, MD;  Location: [facility];  Service: Urology;  Laterality: Bilateral;  ONLY NEEDS 60 MIN   ESOPHAGOGASTRODUODENOSCOPY (EGD) WITH PROPOFOL N/A 01/25/2020   Dr. Marletta Lor: 4 columns grade 1 esophageal varices   ESOPHAGOGASTRODUODENOSCOPY (EGD) WITH PROPOFOL N/A 05/18/2020   Procedure: ESOPHAGOGASTRODUODENOSCOPY (EGD) WITH PROPOFOL;  Surgeon: Lanelle Bal, DO;  [facility];  Service: Endoscopy;  Laterality: N/A;   ESOPHAGOGASTRODUODENOSCOPY (EGD) WITH PROPOFOL N/A 07/28/2020   Procedure:  ESOPHAGOGASTRODUODENOSCOPY (EGD) WITH PROPOFOL;  Surgeon: Lanelle Bal, DO;  [facility];  Service: Endoscopy;  Laterality: N/A;  am or early PM due to givens capsule placement   GIVENS CAPSULE STUDY N/A 05/18/2020   Procedure: GIVENS CAPSULE STUDY;  Surgeon: Dolores Frame, MD;  [facility];  Service: Gastroenterology;  Laterality: N/A;   GIVENS CAPSULE STUDY N/A 07/28/2020   Procedure: GIVENS CAPSULE STUDY;  Surgeon: Lanelle Bal, DO;  [facility];  Service: Endoscopy;  Laterality: N/A;   IR IMAGING GUIDED PORT INSERTION  12/02/2021   IRRIGATION AND DEBRIDEMENT ABSCESS Right 09/28/2022   Procedure: IRRIGATION AND DEBRIDEMENT ABSCESS;  Surgeon: Lewie Chamber, DO;  Location: [facility];  Service: General;  Laterality: Right;   POLYPECTOMY  01/25/2020   Procedure: POLYPECTOMY;  Surgeon: Lanelle Bal, DO;  [facility];  Service: Endoscopy;;   POLYPECTOMY  04/07/2020   Procedure: POLYPECTOMY INTESTINAL;  Surgeon: Lanelle Bal, DO;  [facility];  Service: Endoscopy;;   PORT-A-CATH REMOVAL Right 09/28/2022   Procedure: MINOR REMOVAL PORT-A-CATH;  Surgeon: Lewie Chamber, DO;  Location: [facility];  Service: General;  Laterality: Right;   PORTACATH PLACEMENT Right 04/12/2023   Procedure: INSERTION PORT-A-CATH, RIJ;  Surgeon: Lewie Chamber, DO;  Location: [facility];  Service: General;  Laterality: Right;   SKIN CANCER EXCISION     Face   SPINE SURGERY     SUBMUCOSAL TATTOO INJECTION  01/25/2020   Procedure: SUBMUCOSAL TATTOO INJECTION;  Surgeon: Lanelle Bal, DO;  [facility];  Service: Endoscopy;;    Social History: Social History   Socioeconomic History   Marital status: Widowed    Spouse name: Not on file   Number of children: 2   Years of education: 14   Highest education level: Not on file  Occupational History   Occupation: Retired   Tobacco Use   Smoking status: Former     Current packs/day: 0.00    Average packs/day: 1.5 packs/day for 40.0 years (60.0 ttl pk-yrs)    Types: Cigarettes    Start date: 04/29/1975    Quit date: 04/29/2015    Years since quitting: 8.0   Smokeless tobacco: Never   Tobacco comments:    Quit smoking 04/2015- Previous 1.5 ppd smoker  Vaping Use   Vaping status: Never Used  Substance and Sexual Activity   Alcohol use: No    Alcohol/week: 0.0 standard drinks of alcohol   Drug use: No   Sexual activity: Not Currently    Birth control/protection: Post-menopausal  Other Topics Concern   Not on file  Social History Narrative   Her 31 year old granddaughter lives with her - one daughter lives nearby, but she doesn't have a good relationship with her. Has  a great relationship with other daughter who lives 1.5 hrs away - talks to her daily on the phone.   Social Drivers of Corporate investment banker Strain: Low Risk  (01/27/2023)   Overall Financial Resource Strain (CARDIA)    Difficulty of Paying Living Expenses: Not hard at all  Recent Concern: Financial Resource Strain - High Risk (11/15/2022)   Overall Financial Resource Strain (CARDIA)    Difficulty of Paying Living Expenses: Very hard  Food Insecurity: No Food Insecurity (01/27/2023)   Hunger Vital Sign    Worried About Running Out of Food in the Last Year: Never true    Ran Out of Food in the Last Year: Never true  Recent Concern: Food Insecurity - Food Insecurity Present (11/15/2022)   Hunger Vital Sign    Worried About Running Out of Food in the Last Year: Sometimes true    Ran Out of Food in the Last Year: Never true  Transportation Needs: No Transportation Needs (01/27/2023)   PRAPARE - Administrator, Civil Service (Medical): No    Lack of Transportation (Non-Medical): No  Physical Activity: Inactive (01/27/2023)   Exercise Vital Sign    Days of Exercise per Week: 0 days    Minutes of Exercise per Session: 0 min  Stress: No Stress Concern Present  (01/27/2023)   [employer] of Occupational Health - Occupational Stress Questionnaire    Feeling of Stress : Not at all  Social Connections: Socially Isolated (01/27/2023)   Social Connection and Isolation Panel [NHANES]    Frequency of Communication with Friends and Family: More than three times a week    Frequency of Social Gatherings with Friends and Family: More than three times a week    Attends Religious Services: Never    Database administrator or Organizations: No    Attends Banker Meetings: Never    Marital Status: Widowed  Intimate Partner Violence: Not At Risk (01/27/2023)   Humiliation, Afraid, Rape, and Kick questionnaire    Fear of Current or Ex-Partner: No    Emotionally Abused: No    Physically Abused: No    Sexually Abused: No    Family History: Family History  Problem Relation Age of Onset   Diabetes Father    Heart disease Father 51       MI   Hypertension Father    Anemia Mother        Transfusion dependent   COPD Sister    Cancer Paternal Grandmother 24       Pancreatic    Current Medications:  Current Outpatient Medications:    acyclovir (ZOVIRAX) 400 MG tablet, Take 1 tablet (400 mg total) by mouth 2 (two) times daily., Disp: 60 tablet, Rfl: 6   albuterol (PROVENTIL) (2.5 MG/3ML) 0.083% nebulizer solution, Take 3 mLs (2.5 mg total) by nebulization every 6 (six) hours as needed for wheezing or shortness of breath., Disp: 150 mL, Rfl: 1   albuterol (VENTOLIN HFA) 108 (90 Base) MCG/ACT inhaler, Inhale 2 puffs into the lungs every 6 (six) hours as needed for wheezing or shortness of breath., Disp: 8 g, Rfl: 0   amoxicillin-clavulanate (AUGMENTIN) 875-125 MG tablet, Take 1 tablet by mouth every 12 (twelve) hours., Disp: 14 tablet, Rfl: 0   baclofen (LIORESAL) 10 MG tablet, Take 10 mg by mouth 2 (two) times daily., Disp: , Rfl:    Blood Glucose Calibration (TRUE METRIX LEVEL 1) Low SOLN, Use w/ glucose monitor Dx E11.9, Disp: 3  each, Rfl: 0    Blood Glucose Monitoring Suppl (TRUE METRIX AIR GLUCOSE METER) w/Device KIT, Check BS daily Dx E11.9, Disp: 1 kit, Rfl: 0   Cholecalciferol (VITAMIN D) 50 MCG (2000 UT) tablet, Take 2,000 Units by mouth daily., Disp: , Rfl:    clindamycin (CLEOCIN T) 1 % SWAB, Apply to affected areas under axilla twice daily, Disp: 180 each, Rfl: 3   clobetasol (TEMOVATE) 0.05 % external solution, Apply 1 Application topically 2 (two) times daily., Disp: , Rfl:    Cyanocobalamin (B-12 COMPLIANCE INJECTION) 1000 MCG/ML KIT, Inject 1,000 mcg as directed every 30 (thirty) days., Disp: , Rfl:    desvenlafaxine (PRISTIQ) 100 MG 24 hr tablet, Take 100 mg by mouth in the morning., Disp: , Rfl:    dexamethasone (DECADRON) 4 MG tablet, Take 5 tablets once every 4 weeks prior to darzalex, Disp: 20 tablet, Rfl: 3   doxycycline (VIBRA-TABS) 100 MG tablet, Take 1 tablet (100 mg total) by mouth 2 (two) times daily., Disp: 20 tablet, Rfl: 0   esomeprazole (NEXIUM) 40 MG capsule, Take 1 capsule (40 mg total) by mouth daily., Disp: 30 capsule, Rfl: 2   ferrous sulfate 325 (65 FE) MG tablet, Take 1 tablet (325 mg total) by mouth daily with breakfast., Disp: 30 tablet, Rfl: 3   fluticasone-salmeterol (ADVAIR) 100-50 MCG/ACT AEPB, Inhale 1 puff into the lungs 2 (two) times daily., Disp: , Rfl:    glucose blood (TRUE METRIX BLOOD GLUCOSE TEST) test strip, Check BS daily Dx E11.9, Disp: 100 each, Rfl: 3   lenalidomide (REVLIMID) 15 MG capsule, TAKE 1 CAPSULE BY MOUTH EVERY DAY FOR 21 DAYS ON THEN 7 DAYS OFF, Disp: 21 capsule, Rfl: 0   lidocaine-prilocaine (EMLA) cream, Apply 1 Application topically as needed. Apply a small amount to port a cath site and cover with plastic wrap 1 hour prior to infusion appointments, Disp: 30 g, Rfl: 3   lovastatin (MEVACOR) 20 MG tablet, Take 1 tablet (20 mg total) by mouth at bedtime., Disp: 90 tablet, Rfl: 3   mupirocin ointment (BACTROBAN) 2 %, Apply 1 Application topically 2 (two) times daily., Disp:  22 g, Rfl: 1   nystatin (MYCOSTATIN/NYSTOP) powder, Apply topically 2 (two) times daily., Disp: 15 g, Rfl: 1   oxyCODONE-acetaminophen (PERCOCET) 10-325 MG tablet, Take 1 tablet by mouth every 6 (six) hours as needed for pain., Disp: , Rfl:    OXYGEN, Inhale 3 L into the lungs continuous., Disp: , Rfl:    OZEMPIC, 0.25 OR 0.5 MG/DOSE, 2 MG/3ML SOPN, Inject 0.25 mg into the skin every 7 (seven) days., Disp: 9 mL, Rfl: 3   potassium chloride SA (KLOR-CON) 20 MEQ tablet, Take 2 tablets (40 mEq total) by mouth 2 (two) times daily. (Patient taking differently: Take 40 mEq by mouth daily.), Disp: 360 tablet, Rfl: 3   predniSONE (STERAPRED UNI-PAK 21 TAB) 10 MG (21) TBPK tablet, Use as directed, Disp: 21 tablet, Rfl: 0   prochlorperazine (COMPAZINE) 10 MG tablet, Take 1 tablet (10 mg total) by mouth every 6 (six) hours as needed for nausea or vomiting., Disp: 30 tablet, Rfl: 6   torsemide (DEMADEX) 20 MG tablet, Take 2.5 tablets (50 mg total) by mouth 2 (two) times daily., Disp: , Rfl:    TRUEplus Lancets 33G MISC, Check BS daily Dx E11.9, Disp: 100 each, Rfl: 3   venlafaxine XR (EFFEXOR-XR) 150 MG 24 hr capsule, Take 1 capsule (150 mg total) by mouth daily with breakfast., Disp: 30 capsule, Rfl: 2  No current facility-administered medications for this visit.  Facility-Administered Medications Ordered in Other Visits:    sodium chloride flush (NS) 0.9 % injection 10 mL, 10 mL, Intravenous, PRN, Pennington, Rebekah M, PA-C, 10 mL at 03/08/22 1344   Allergies: Allergies  Allergen Reactions   Keflex [Cephalexin] Nausea And Vomiting     REVIEW OF SYSTEMS:   Review of Systems  Constitutional:  Negative for chills, fatigue and fever.  HENT:   Negative for lump/mass, mouth sores, nosebleeds, sore throat and trouble swallowing.   Eyes:  Negative for eye problems.  Respiratory:  Negative for cough and shortness of breath.   Cardiovascular:  Negative for chest pain, leg swelling and palpitations.   Gastrointestinal:  Negative for abdominal pain, constipation, diarrhea, nausea and vomiting.  Genitourinary:  Negative for bladder incontinence, difficulty urinating, dysuria, frequency, hematuria and nocturia.   Musculoskeletal:  Negative for arthralgias, back pain, flank pain, myalgias and neck pain.  Skin:  Negative for itching and rash.  Neurological:  Negative for dizziness, headaches and numbness.  Hematological:  Does not bruise/bleed easily.  Psychiatric/Behavioral:  Negative for depression, sleep disturbance and suicidal ideas. The patient is not nervous/anxious.   All other systems reviewed and are negative.    VITALS:   There were no vitals taken for this visit.  Wt Readings from Last 3 Encounters:  05/09/23 (!) 304 lb 3.8 oz (138 kg)  04/12/23 (!) 302 lb 14.6 oz (137.4 kg)  04/10/23 (!) 303 lb (137.4 kg)    There is no height or weight on file to calculate BMI.  Performance status (ECOG): 1 - Symptomatic but completely ambulatory  PHYSICAL EXAM:   Physical Exam Vitals and nursing note reviewed. Exam conducted with a chaperone present.  Constitutional:      Appearance: Normal appearance.  Cardiovascular:     Rate and Rhythm: Normal rate and regular rhythm.     Pulses: Normal pulses.     Heart sounds: Normal heart sounds.  Pulmonary:     Effort: Pulmonary effort is normal.     Breath sounds: Normal breath sounds.  Abdominal:     Palpations: Abdomen is soft. There is no hepatomegaly, splenomegaly or mass.     Tenderness: There is no abdominal tenderness.  Musculoskeletal:     Right lower leg: No edema.     Left lower leg: No edema.  Lymphadenopathy:     Cervical: No cervical adenopathy.     Right cervical: No superficial, deep or posterior cervical adenopathy.    Left cervical: No superficial, deep or posterior cervical adenopathy.     Upper Body:     Right upper body: No supraclavicular or axillary adenopathy.     Left upper body: No supraclavicular or  axillary adenopathy.  Neurological:     General: No focal deficit present.     Mental Status: She is alert and oriented to person, place, and time.  Psychiatric:        Mood and Affect: Mood normal.        Behavior: Behavior normal.     LABS:      Latest Ref Rng & Units 05/09/2023    1:37 PM 04/13/2023    8:29 AM 03/16/2023    8:20 AM  CBC  WBC 4.0 - 10.5 K/uL 3.4  5.1  3.4   Hemoglobin 12.0 - 15.0 g/dL 16.1  09.6  04.5   Hematocrit 36.0 - 46.0 % 35.3  34.3  36.9   Platelets 150 - 400 K/uL 52  88  98       Latest Ref Rng & Units 05/09/2023    1:37 PM 04/13/2023    8:29 AM 03/16/2023    8:20 AM  CMP  Glucose 70 - 99 mg/dL 161  096  045   BUN 8 - 23 mg/dL 11  12  14    Creatinine 0.44 - 1.00 mg/dL 4.09  8.11  9.14   Sodium 135 - 145 mmol/L 141  137  137   Potassium 3.5 - 5.1 mmol/L 3.1  4.0  3.8   Chloride 98 - 111 mmol/L 102  101  100   CO2 22 - 32 mmol/L 32  30  29   Calcium 8.9 - 10.3 mg/dL 8.3  8.3  8.6   Total Protein 6.5 - 8.1 g/dL 5.6  5.8  5.9   Total Bilirubin 0.0 - 1.2 mg/dL 0.8  0.6  0.5   Alkaline Phos 38 - 126 U/L 89  108  103   AST 15 - 41 U/L 17  18  25    ALT 0 - 44 U/L 16  16  25       No results found for: "CEA1", "CEA" / No results found for: "CEA1", "CEA" No results found for: "PSA1" No results found for: "CAN199" No results found for: "CAN125"  Lab Results  Component Value Date   TOTALPROTELP 5.3 (L) 04/13/2023   ALBUMINELP 3.4 04/13/2023   A1GS 0.2 04/13/2023   A2GS 0.6 04/13/2023   BETS 0.7 04/13/2023   GAMS 0.4 04/13/2023   MSPIKE 0.3 (H) 04/13/2023   SPEI Comment 04/13/2023   Lab Results  Component Value Date   TIBC 294 04/13/2023   TIBC 285 02/09/2023   TIBC 323 01/12/2023   FERRITIN 858 (H) 04/13/2023   FERRITIN 578 (H) 02/09/2023   FERRITIN 441 (H) 01/12/2023   IRONPCTSAT 32 (H) 04/13/2023   IRONPCTSAT 136 (H) 02/09/2023   IRONPCTSAT 28 01/12/2023   Lab Results  Component Value Date   LDH 92 (L) 10/22/2021   LDH 101  06/25/2021   LDH 103 03/24/2021     STUDIES:   DG Chest 2 View Result Date: 05/09/2023 CLINICAL DATA:  Shortness of breath. EXAM: CHEST - 2 VIEW COMPARISON:  05/04/2023. FINDINGS: Diffuse mild-to-moderate pulmonary vascular congestion/edema. There are left retrocardiac airspace opacity partially obscuring the left medial hemidiaphragm and descending thoracic aorta, suggesting combination of left lung atelectasis and/or consolidation. Bilateral lung fields are otherwise clear. Bilateral costophrenic angles are clear. Stable cardio-mediastinal silhouette. No acute osseous abnormalities. The soft tissues are within normal limits. Right-sided CT Port-A-Cath is seen with its tip overlying the upper portion of superior vena cava. IMPRESSION: *Findings favor congestive heart failure/pulmonary edema. *There is additional left retrocardiac opacity, as described above. Electronically Signed   By: Jules Schick M.D.   On: 05/09/2023 13:58   DG Chest 2 View Result Date: 05/09/2023 CLINICAL DATA:  Shortness of breath.  Wheezing. EXAM: CHEST - 2 VIEW COMPARISON:  04/12/2023 and 09/26/2022 FINDINGS: Stable mild cardiomegaly. Right IJ Port-A-Cath remains in appropriate position. Chronic pulmonary interstitial prominence is stable as well as mild scarring at the left lung base. No evidence of acute infiltrate or pleural effusion. IMPRESSION: Stable mild cardiomegaly and chronic pulmonary interstitial prominence. No acute findings. Electronically Signed   By: Danae Orleans M.D.   On: 05/09/2023 08:24   DG Chest Port 1 View Result Date: 04/12/2023 CLINICAL DATA:  Port-A-Cath placement. EXAM: PORTABLE CHEST 1 VIEW COMPARISON:  September 26, 2022. FINDINGS:  Stable cardiomediastinal silhouette. Right internal jugular Port-A-Cath is noted with tip in expected position of the SVC. No pneumothorax is noted. Mild bibasilar subsegmental atelectasis or scarring is noted. Bony thorax is unremarkable. IMPRESSION: Right internal jugular  Port-A-Cath is noted with tip in expected position of the SVC. Electronically Signed   By: Lupita Raider M.D.   On: 04/12/2023 13:45   DG C-Arm 1-60 Min-No Report Result Date: 04/12/2023 Fluoroscopy was utilized by the requesting physician.  No radiographic interpretation.

## 2023-05-10 NOTE — Transitions of Care (Post Inpatient/ED Visit) (Unsigned)
   05/10/2023  Name: Savannah Burns MRN: [id number] DOB: Jan 11, 1951  Today's TOC FU Call Status: Today's TOC FU Call Status:: Unsuccessful Call (1st Attempt) Unsuccessful Call (1st Attempt) Date: 05/10/23  Attempted to reach the patient regarding the most recent Inpatient/ED visit.  Follow Up Plan: Additional outreach attempts will be made to reach the patient to complete the Transitions of Care (Post Inpatient/ED visit) call.   Signature Karena Addison, LPN [facility] Nurse Health Advisor Direct Dial [phone]

## 2023-05-11 ENCOUNTER — Telehealth: Payer: Self-pay | Admitting: Family Medicine

## 2023-05-11 ENCOUNTER — Inpatient Hospital Stay: Payer: Medicare PPO

## 2023-05-11 ENCOUNTER — Inpatient Hospital Stay: Payer: Medicare PPO | Admitting: Hematology

## 2023-05-11 NOTE — Transitions of Care (Post Inpatient/ED Visit) (Signed)
05/11/2023  Name: Savannah Burns MRN: [id number] DOB: 05/23/1950  Today's TOC FU Call Status: Today's TOC FU Call Status:: Successful TOC FU Call Completed Unsuccessful Call (1st Attempt) Date: 05/10/23 [facility] FU Call Complete Date: 05/11/23 Patient's Name and Date of Birth confirmed.  Transition Care Management Follow-up Telephone Call Date of Discharge: 05/09/23 [facility] Type of Discharge: Emergency Department Reason for ED Visit: Other: (COPD) How have you been since you were released from the hospital?: Better Any questions or concerns?: No  Items Reviewed: Did you receive and understand the discharge instructions provided?: Yes Medications obtained,verified, and reconciled?: Yes (Medications Reviewed) Any new allergies since your discharge?: No Dietary orders reviewed?: Yes Do you have support at home?: Yes People in Home: grandchild(ren)  Medications Reviewed Today: Medications Reviewed Today     Reviewed by Karena Addison, LPN (Licensed Practical Nurse) on 05/11/23 at 1140  Med List Status: <None>   Medication Order Taking? Sig Documenting Provider Last Dose Status Informant  acyclovir (ZOVIRAX) 400 MG tablet [id number] No Take 1 tablet (400 mg total) by mouth 2 (two) times daily. Doreatha Massed, MD Taking Active Self, Pharmacy Records  albuterol (PROVENTIL) (2.5 MG/3ML) 0.083% nebulizer solution [id number]  Take 3 mLs (2.5 mg total) by nebulization every 6 (six) hours as needed for wheezing or shortness of breath. Jannifer Rodney A, FNP  Active   albuterol (VENTOLIN HFA) 108 (90 Base) MCG/ACT inhaler [id number]  Inhale 2 puffs into the lungs every 6 (six) hours as needed for wheezing or shortness of breath. Jannifer Rodney A, FNP  Active   amoxicillin-clavulanate (AUGMENTIN) 875-125 MG tablet [id number]  Take 1 tablet by mouth every 12 (twelve) hours. Cristi Loron, Celeste A, PA-C  Active   baclofen (LIORESAL) 10 MG tablet [id number] No Take 10 mg  by mouth 2 (two) times daily. [provider] Taking Active Self, Pharmacy Records  Blood Glucose Calibration (TRUE METRIX LEVEL 1) Low SOLN [id number] No Use w/ glucose monitor Dx E11.9 Raliegh Ip, DO Taking Active Self, Pharmacy Records  Blood Glucose Monitoring Suppl (TRUE METRIX AIR GLUCOSE METER) w/Device KIT [id number] No Check BS daily Dx E11.9 Raliegh Ip, DO Taking Active Self, Pharmacy Records  Cholecalciferol (VITAMIN D) 50 MCG (2000 UT) tablet [id number] No Take 2,000 Units by mouth daily. [provider] Taking Active Self, Pharmacy Records  clindamycin (CLEOCIN T) 1 % SWAB [id number] No Apply to affected areas under axilla twice daily Gottschalk, Ashly M, DO Taking Active   clobetasol (TEMOVATE) 0.05 % external solution [id number] No Apply 1 Application topically 2 (two) times daily. [provider] Taking Active Self, Pharmacy Records  Cyanocobalamin (B-12 COMPLIANCE INJECTION) 1000 MCG/ML KIT [id number] No Inject 1,000 mcg as directed every 30 (thirty) days. [provider] Taking Active Self, Pharmacy Records  desvenlafaxine (PRISTIQ) 100 MG 24 hr tablet [id number] No Take 100 mg by mouth in the morning. [provider] Taking Active   dexamethasone (DECADRON) 4 MG tablet [id number] No Take 5 tablets once every 4 weeks prior to darzalex Doreatha Massed, MD Taking Active   doxycycline (VIBRA-TABS) 100 MG tablet [id number]  Take 1 tablet (100 mg total) by mouth 2 (two) times daily. Jannifer Rodney A, FNP  Active   esomeprazole (NEXIUM) 40 MG capsule [id number] No Take 1 capsule (40 mg total) by mouth daily. Vassie Loll, MD Taking Active   ferrous sulfate 325 (65 FE) MG tablet [id number] No Take 1 tablet (325 mg total) by mouth daily with breakfast. Doreatha Massed,  MD 09/25/2022 Expired 09/27/22 2359 Self, Pharmacy Records  fluticasone-salmeterol (ADVAIR) 100-50 MCG/ACT AEPB [id number] No Inhale 1 puff into the lungs 2 (two)  times daily. [provider] Taking Active   glucose blood (TRUE METRIX BLOOD GLUCOSE TEST) test strip [id number] No Check BS daily Dx E11.9 Delynn Flavin M, DO Taking Active Self, Pharmacy Records  lenalidomide (REVLIMID) 15 MG capsule [id number]  TAKE 1 CAPSULE BY MOUTH EVERY DAY FOR 21 DAYS ON THEN 7 DAYS OFF Doreatha Massed, MD  Active   lidocaine-prilocaine (EMLA) cream [id number] No Apply 1 Application topically as needed. Apply a small amount to port a cath site and cover with plastic wrap 1 hour prior to infusion appointments Pappayliou, Gustavus Messing, DO Taking Active   lovastatin (MEVACOR) 20 MG tablet [id number] No Take 1 tablet (20 mg total) by mouth at bedtime. Raliegh Ip, DO Taking Active Self, Pharmacy Records  mupirocin ointment (BACTROBAN) 2 % [id number] No Apply 1 Application topically 2 (two) times daily. Blanchard Kelch, NP Taking Active   nystatin (MYCOSTATIN/NYSTOP) powder [id number] No Apply topically 2 (two) times daily. Dettinger, Elige Radon, MD Taking Active   oxyCODONE-acetaminophen (PERCOCET) 10-325 MG tablet [id number] No Take 1 tablet by mouth every 6 (six) hours as needed for pain. [provider] Taking Active Self, Pharmacy Records  OXYGEN [id number] No Inhale 3 L into the lungs continuous. [provider] Taking Active Self, Pharmacy Records           Med Note Frazier Butt, LINDSAY E   Wed Apr 12, 2023  9:16 AM) 3L home oxygen  OZEMPIC, 0.25 OR 0.5 MG/DOSE, 2 MG/3ML SOPN [id number] No Inject 0.25 mg into the skin every 7 (seven) days. Raliegh Ip, DO Taking Active            Med Note Patsi Sears, Justice Deeds Apr 10, 2023 10:18 AM) Last dose was 1/1- per patient  potassium chloride SA (KLOR-CON) 20 MEQ tablet [id number] No Take 2 tablets (40 mEq total) by mouth 2 (two) times daily.  Patient taking differently: Take 40 mEq by mouth daily.   Raliegh Ip, DO Taking Active Self, Pharmacy Records  predniSONE (STERAPRED UNI-PAK 21  TAB) 10 MG (21) TBPK tablet [id number]  Use as directed Junie Spencer, FNP  Active   prochlorperazine (COMPAZINE) 10 MG tablet [id number] No Take 1 tablet (10 mg total) by mouth every 6 (six) hours as needed for nausea or vomiting. Doreatha Massed, MD Taking Active Self, Pharmacy Records  torsemide [facility]) 20 MG tablet [id number] No Take 2.5 tablets (50 mg total) by mouth 2 (two) times daily. Vassie Loll, MD Taking Expired 01/01/23 2359   TRUEplus Lancets 33G MISC [id number] No Check BS daily Dx E11.9 Raliegh Ip, DO Taking Active Self, Pharmacy Records  venlafaxine XR (EFFEXOR-XR) 150 MG 24 hr capsule [id number] No Take 1 capsule (150 mg total) by mouth daily with breakfast. Vassie Loll, MD Taking Active   Med List Note Remi Haggard, RPH-CPP 12/03/21 1141): Revlimid filled at [facility]            Home Care and Equipment/Supplies: Were Home Health Services Ordered?: NA Any new equipment or medical supplies ordered?: NA  Functional Questionnaire: Do you need assistance with bathing/showering or dressing?: No Do you need assistance with meal preparation?: No Do you need assistance with eating?: No Do you have difficulty maintaining continence: No Do you need assistance with getting out of bed/getting out of a chair/moving?: No Do  you have difficulty managing or taking your medications?: No  Follow up appointments reviewed: PCP Follow-up appointment confirmed?: No (no appt, sent message to staff to schedule) MD Provider Line Number:[phone] Given: No Specialist Hospital Follow-up appointment confirmed?: NA Do you need transportation to your follow-up appointment?: No Do you understand care options if your condition(s) worsen?: Yes-patient verbalized understanding    SIGNATURE Karena Addison, LPN [facility] Nurse Health Advisor Direct Dial [phone]

## 2023-05-11 NOTE — Telephone Encounter (Unsigned)
Copied from CRM [id number]. Topic: Clinical - Prescription Issue >> May 11, 2023  8:33 AM Gaetano Hawthorne wrote: Reason for CRM: Patient's granddaughter is calling to report that the pharmacy where the prescription for the nebulizer machine was called into does not stock it. They were wondering if it could be called into the [facility] in [place] [phone]). They would greatly appreciate a call when the order has been sent in.

## 2023-05-11 NOTE — Telephone Encounter (Signed)
Sent in

## 2023-05-11 NOTE — Telephone Encounter (Signed)
Granddaughter aware

## 2023-05-14 DIAGNOSIS — Z79899 Other long term (current) drug therapy: Secondary | ICD-10-CM | POA: Diagnosis not present

## 2023-05-14 DIAGNOSIS — Z6841 Body Mass Index (BMI) 40.0 and over, adult: Secondary | ICD-10-CM | POA: Diagnosis not present

## 2023-05-14 DIAGNOSIS — G894 Chronic pain syndrome: Secondary | ICD-10-CM | POA: Diagnosis not present

## 2023-05-14 DIAGNOSIS — M51369 Other intervertebral disc degeneration, lumbar region without mention of lumbar back pain or lower extremity pain: Secondary | ICD-10-CM | POA: Diagnosis not present

## 2023-05-15 ENCOUNTER — Encounter: Payer: Self-pay | Admitting: Family Medicine

## 2023-05-15 ENCOUNTER — Telehealth: Payer: Self-pay | Admitting: Family Medicine

## 2023-05-15 ENCOUNTER — Telehealth: Payer: Self-pay

## 2023-05-15 ENCOUNTER — Ambulatory Visit: Payer: Medicare PPO | Admitting: Family Medicine

## 2023-05-15 VITALS — BP 119/46 | HR 67 | Temp 97.8°F | Ht 62.0 in | Wt 296.0 lb

## 2023-05-15 DIAGNOSIS — J101 Influenza due to other identified influenza virus with other respiratory manifestations: Secondary | ICD-10-CM

## 2023-05-15 DIAGNOSIS — J441 Chronic obstructive pulmonary disease with (acute) exacerbation: Secondary | ICD-10-CM

## 2023-05-15 DIAGNOSIS — J9611 Chronic respiratory failure with hypoxia: Secondary | ICD-10-CM | POA: Diagnosis not present

## 2023-05-15 DIAGNOSIS — R195 Other fecal abnormalities: Secondary | ICD-10-CM

## 2023-05-15 DIAGNOSIS — Z0279 Encounter for issue of other medical certificate: Secondary | ICD-10-CM

## 2023-05-15 LAB — BASIC METABOLIC PANEL
BUN/Creatinine Ratio: 18 (ref 12–28)
BUN: 11 mg/dL (ref 8–27)
CO2: 25 mmol/L (ref 20–29)
Calcium: 8.9 mg/dL (ref 8.7–10.3)
Chloride: 103 mmol/L (ref 96–106)
Creatinine, Ser: 0.62 mg/dL (ref 0.57–1.00)
Glucose: 130 mg/dL — ABNORMAL HIGH (ref 70–99)
Potassium: 3.5 mmol/L (ref 3.5–5.2)
Sodium: 146 mmol/L — ABNORMAL HIGH (ref 134–144)
eGFR: 94 mL/min/{1.73_m2} (ref >59)

## 2023-05-15 LAB — CBC
Hematocrit: 36.7 % (ref 34.0–46.6)
Hemoglobin: 12.1 g/dL (ref 11.1–15.9)
MCH: 33.4 pg — ABNORMAL HIGH (ref 26.6–33.0)
MCHC: 33 g/dL (ref 31.5–35.7)
MCV: 101 fL — ABNORMAL HIGH (ref 79–97)
Platelets: 109 10*3/uL — ABNORMAL LOW (ref 150–450)
RBC: 3.62 x10E6/uL — ABNORMAL LOW (ref 3.77–5.28)
RDW: 13.1 % (ref 11.7–15.4)
WBC: 4.8 10*3/uL (ref 3.4–10.8)

## 2023-05-15 MED ORDER — ALBUTEROL SULFATE (2.5 MG/3ML) 0.083% IN NEBU: 2.5000 mg | INHALATION_SOLUTION | Freq: Four times a day (QID) | RESPIRATORY_TRACT | 1 refills | Status: DC | PRN

## 2023-05-15 NOTE — Telephone Encounter (Signed)
Pharmacy Patient Advocate Encounter   Received notification from Fax that prior authorization for Nystatin 100000 UNIT/GM powder is required/requested.   Insurance verification completed.   The patient is insured through [employer] .   Per test claim: PA required; PA submitted to above mentioned insurance via CoverMyMeds Key/confirmation #/EOC [id number] Status is pending

## 2023-05-15 NOTE — Progress Notes (Signed)
Subjective: CC:ER follow up PCP: Raliegh Ip, DO [id number] Savannah Burns is a 73 y.o. female presenting to clinic today for:  1. COPD exacerbation due to Flu A Was seen in the ER on 05/09/2023.  Found to have influenza A and was having a COPD exacerbation.  She is accompanied today's visit by her granddaughter.  She reports that she still has some shortness of breath and has been utilizing her albuterol.  She is nearly done with the doxycycline and has completed the prednisone.  Wonders if she should take the Augmentin that was previously prescribed by one of our partners here.  She admits to loose stool with the doxycycline and was not sure if she maybe she needed to see her gastroenterologist again.  She was seen by [facility] a while back.  Her stools do seem to be darker than normal sometimes.  Has not seen overt blood in stool.   ROS: Per HPI  Allergies  Allergen Reactions   Keflex [Cephalexin] Nausea And Vomiting   Past Medical History:  Diagnosis Date   Adrenal adenoma, left    Stable   Anxiety    Arthritis    bilateral hands   COPD (chronic obstructive pulmonary disease) (HCC)    Depression    Diabetes mellitus, type 2 (HCC) 08/12/2008   Qualifier: Diagnosis of  By: Dayton Martes MD, Talia     Dyspnea    Esophageal varices (HCC)    Grade II diastolic dysfunction    History of kidney stones    Hyperlipidemia    Hypertension    Lower back pain    Lower GI bleed 03/19/2020   Panic attacks    Pneumonia    currently taking antibiotic and prednisone for early stages of pneumonia   Pulmonary nodules    bilateral   Skin cancer    face    Current Outpatient Medications:    albuterol (PROVENTIL) (2.5 MG/3ML) 0.083% nebulizer solution, Take 3 mLs (2.5 mg total) by nebulization every 6 (six) hours as needed for wheezing or shortness of breath., Disp: 150 mL, Rfl: 1   albuterol (VENTOLIN HFA) 108 (90 Base) MCG/ACT inhaler, Inhale 2 puffs into the lungs every 6 (six) hours as needed  for wheezing or shortness of breath., Disp: 8 g, Rfl: 0   baclofen (LIORESAL) 10 MG tablet, Take 10 mg by mouth 2 (two) times daily., Disp: , Rfl:    Blood Glucose Calibration (TRUE METRIX LEVEL 1) Low SOLN, Use w/ glucose monitor Dx E11.9, Disp: 3 each, Rfl: 0   Blood Glucose Monitoring Suppl (TRUE METRIX AIR GLUCOSE METER) w/Device KIT, Check BS daily Dx E11.9, Disp: 1 kit, Rfl: 0   Cholecalciferol (VITAMIN D) 50 MCG (2000 UT) tablet, Take 2,000 Units by mouth daily., Disp: , Rfl:    Cyanocobalamin (B-12 COMPLIANCE INJECTION) 1000 MCG/ML KIT, Inject 1,000 mcg as directed every 30 (thirty) days., Disp: , Rfl:    desvenlafaxine (PRISTIQ) 100 MG 24 hr tablet, Take 100 mg by mouth in the morning., Disp: , Rfl:    doxycycline (VIBRA-TABS) 100 MG tablet, Take 1 tablet (100 mg total) by mouth 2 (two) times daily., Disp: 20 tablet, Rfl: 0   esomeprazole (NEXIUM) 40 MG capsule, Take 1 capsule (40 mg total) by mouth daily., Disp: 30 capsule, Rfl: 2   fluticasone-salmeterol (ADVAIR) 100-50 MCG/ACT AEPB, Inhale 1 puff into the lungs 2 (two) times daily., Disp: , Rfl:    glucose blood (TRUE METRIX BLOOD GLUCOSE TEST) test strip, Check BS  daily Dx E11.9, Disp: 100 each, Rfl: 3   lenalidomide (REVLIMID) 15 MG capsule, TAKE 1 CAPSULE BY MOUTH EVERY DAY FOR 21 DAYS ON THEN 7 DAYS OFF, Disp: 21 capsule, Rfl: 0   lidocaine-prilocaine (EMLA) cream, Apply 1 Application topically as needed. Apply a small amount to port a cath site and cover with plastic wrap 1 hour prior to infusion appointments, Disp: 30 g, Rfl: 3   lovastatin (MEVACOR) 20 MG tablet, Take 1 tablet (20 mg total) by mouth at bedtime., Disp: 90 tablet, Rfl: 3   mupirocin ointment (BACTROBAN) 2 %, Apply 1 Application topically 2 (two) times daily., Disp: 22 g, Rfl: 1   nystatin (MYCOSTATIN/NYSTOP) powder, Apply topically 2 (two) times daily., Disp: 15 g, Rfl: 1   oxyCODONE-acetaminophen (PERCOCET) 10-325 MG tablet, Take 1 tablet by mouth every 6 (six)  hours as needed for pain., Disp: , Rfl:    OXYGEN, Inhale 3 L into the lungs continuous., Disp: , Rfl:    OZEMPIC, 0.25 OR 0.5 MG/DOSE, 2 MG/3ML SOPN, Inject 0.25 mg into the skin every 7 (seven) days., Disp: 9 mL, Rfl: 3   potassium chloride SA (KLOR-CON) 20 MEQ tablet, Take 2 tablets (40 mEq total) by mouth 2 (two) times daily. (Patient taking differently: Take 40 mEq by mouth daily.), Disp: 360 tablet, Rfl: 3   predniSONE (STERAPRED UNI-PAK 21 TAB) 10 MG (21) TBPK tablet, Use as directed, Disp: 21 tablet, Rfl: 0   prochlorperazine (COMPAZINE) 10 MG tablet, Take 1 tablet (10 mg total) by mouth every 6 (six) hours as needed for nausea or vomiting., Disp: 30 tablet, Rfl: 6   TRUEplus Lancets 33G MISC, Check BS daily Dx E11.9, Disp: 100 each, Rfl: 3   venlafaxine XR (EFFEXOR-XR) 150 MG 24 hr capsule, Take 1 capsule (150 mg total) by mouth daily with breakfast., Disp: 30 capsule, Rfl: 2   acyclovir (ZOVIRAX) 400 MG tablet, Take 1 tablet (400 mg total) by mouth 2 (two) times daily. (Patient not taking: Reported on 05/15/2023), Disp: 60 tablet, Rfl: 6   amoxicillin-clavulanate (AUGMENTIN) 875-125 MG tablet, Take 1 tablet by mouth every 12 (twelve) hours. (Patient not taking: Reported on 05/15/2023), Disp: 14 tablet, Rfl: 0   clindamycin (CLEOCIN T) 1 % SWAB, Apply to affected areas under axilla twice daily (Patient not taking: Reported on 05/15/2023), Disp: 180 each, Rfl: 3   clobetasol (TEMOVATE) 0.05 % external solution, Apply 1 Application topically 2 (two) times daily. (Patient not taking: Reported on 05/15/2023), Disp: , Rfl:    dexamethasone (DECADRON) 4 MG tablet, Take 5 tablets once every 4 weeks prior to darzalex, Disp: 20 tablet, Rfl: 3   ferrous sulfate 325 (65 FE) MG tablet, Take 1 tablet (325 mg total) by mouth daily with breakfast., Disp: 30 tablet, Rfl: 3   torsemide (DEMADEX) 20 MG tablet, Take 2.5 tablets (50 mg total) by mouth 2 (two) times daily., Disp: , Rfl:  No current  facility-administered medications for this visit.  Facility-Administered Medications Ordered in Other Visits:    sodium chloride flush (NS) 0.9 % injection 10 mL, 10 mL, Intravenous, PRN, Pennington, Rebekah M, PA-C, 10 mL at 03/08/22 1344 Social History   Socioeconomic History   Marital status: Widowed    Spouse name: Not on file   Number of children: 2   Years of education: 14   Highest education level: Not on file  Occupational History   Occupation: Retired   Tobacco Use   Smoking status: Former    Current packs/day:  0.00    Average packs/day: 1.5 packs/day for 40.0 years (60.0 ttl pk-yrs)    Types: Cigarettes    Start date: 04/29/1975    Quit date: 04/29/2015    Years since quitting: 8.0   Smokeless tobacco: Never   Tobacco comments:    Quit smoking 04/2015- Previous 1.5 ppd smoker  Vaping Use   Vaping status: Never Used  Substance and Sexual Activity   Alcohol use: No    Alcohol/week: 0.0 standard drinks of alcohol   Drug use: No   Sexual activity: Not Currently    Birth control/protection: Post-menopausal  Other Topics Concern   Not on file  Social History Narrative   Her 2 year old granddaughter lives with her - one daughter lives nearby, but she doesn't have a good relationship with her. Has a great relationship with other daughter who lives 1.5 hrs away - talks to her daily on the phone.   Social Drivers of Corporate investment banker Strain: Low Risk  (01/27/2023)   Overall Financial Resource Strain (CARDIA)    Difficulty of Paying Living Expenses: Not hard at all  Recent Concern: Financial Resource Strain - High Risk (11/15/2022)   Overall Financial Resource Strain (CARDIA)    Difficulty of Paying Living Expenses: Very hard  Food Insecurity: No Food Insecurity (01/27/2023)   Hunger Vital Sign    Worried About Running Out of Food in the Last Year: Never true    Ran Out of Food in the Last Year: Never true  Recent Concern: Food Insecurity - Food Insecurity Present  (11/15/2022)   Hunger Vital Sign    Worried About Running Out of Food in the Last Year: Sometimes true    Ran Out of Food in the Last Year: Never true  Transportation Needs: No Transportation Needs (01/27/2023)   PRAPARE - Administrator, Civil Service (Medical): No    Lack of Transportation (Non-Medical): No  Physical Activity: Inactive (01/27/2023)   Exercise Vital Sign    Days of Exercise per Week: 0 days    Minutes of Exercise per Session: 0 min  Stress: No Stress Concern Present (01/27/2023)   [employer] of Occupational Health - Occupational Stress Questionnaire    Feeling of Stress : Not at all  Social Connections: Socially Isolated (01/27/2023)   Social Connection and Isolation Panel [NHANES]    Frequency of Communication with Friends and Family: More than three times a week    Frequency of Social Gatherings with Friends and Family: More than three times a week    Attends Religious Services: Never    Database administrator or Organizations: No    Attends Banker Meetings: Never    Marital Status: Widowed  Intimate Partner Violence: Not At Risk (01/27/2023)   Humiliation, Afraid, Rape, and Kick questionnaire    Fear of Current or Ex-Partner: No    Emotionally Abused: No    Physically Abused: No    Sexually Abused: No   Family History  Problem Relation Age of Onset   Diabetes Father    Heart disease Father 57       MI   Hypertension Father    Anemia Mother        Transfusion dependent   COPD Sister    Cancer Paternal Grandmother 77       Pancreatic    Objective: Office vital signs reviewed. BP (!) 119/46   Pulse 67   Temp 97.8 F (36.6 C)  Ht 5\' 2"  (1.575 m)   Wt 296 lb (134.3 kg)   SpO2 93%   BMI 54.14 kg/m   Physical Examination:  General: Awake, alert, morbidly obese, No acute distress HEENT: Sclera white.  Moist mucous membranes.  Has a cystic lesion noted in the posterior lateral aspect of the neck without evidence of  secondary infection or complication Cardio: regular rate and rhythm, S1S2 heard, no murmurs appreciated Pulm: Globally decreased breath sounds but no wheezes, rhonchi or rales.  Normal work of breathing on home 3 L via nasal cannula  Assessment/ Plan: 73 y.o. female   COPD exacerbation (HCC) - Plan: albuterol (PROVENTIL) (2.5 MG/3ML) 0.083% nebulizer solution  Influenza A  Chronic respiratory failure with hypoxia (HCC) - Plan: Basic Metabolic Panel, CBC  Dark stools - Plan: Fecal occult blood, imunochemical  I have renewed her nebulizer solution.  Her lungs did sound tight today.  I think it is reasonable to proceed with Augmentin but I did caution her this may exacerbate her loose stools.  Encouraged her to get a probiotic or start eating yogurt to stabilize gut flora  I will repeat her BMP and CBC.  It sounds like she really did not increase her torsemide as directed in the ER.  I will CC cardiology as FYI  Fecal occult blood test ordered.  Supplies given.  Return ASAP and we will determine if she needs to see gastroenterology again or not   Raliegh Ip, DO [facility] [phone]

## 2023-05-15 NOTE — Telephone Encounter (Signed)
Savannah Burns dropped off Handicap forms to be completed and signed.  Form Fee Paid? (Y/N)       y     If NO, form is placed on front office manager desk to hold until payment received. If YES, then form will be placed in the RX/HH Nurse Coordinators box for completion.  Form will not be processed until payment is received

## 2023-05-16 ENCOUNTER — Other Ambulatory Visit (HOSPITAL_COMMUNITY): Payer: Self-pay

## 2023-05-16 ENCOUNTER — Encounter: Payer: Self-pay | Admitting: Family Medicine

## 2023-05-16 NOTE — Telephone Encounter (Signed)
Pharmacy Patient Advocate Encounter  Received notification from [facility] that Prior Authorization for Nystatin 100000 UNIT/GM powder has been APPROVED from 03/29/2023 to 03/27/2024. Ran test claim, Copay is $0.00. This test claim was processed through [facility]- copay amounts may vary at other pharmacies due to pharmacy/plan contracts, or as the patient moves through the different stages of their insurance plan.   PA #/Case ID/Reference #: [id number]

## 2023-05-17 DIAGNOSIS — Z79899 Other long term (current) drug therapy: Secondary | ICD-10-CM | POA: Diagnosis not present

## 2023-05-24 NOTE — Progress Notes (Signed)
 [facility] [address], [place] [address]    Clinic Day:  05/25/2023  Referring physician: Raliegh Ip, DO  Patient Care Team: Raliegh Ip, DO as PCP - General (Family Medicine) Wyline Mood Dorothe Pea, MD as PCP - Cardiology (Cardiology) Letta Kocher, MD (Rehabilitation) Coralyn Helling, MD (Inactive) as Consulting Physician (Pulmonary Disease) Lanelle Bal, DO as Consulting Physician (Internal Medicine) Merryl Hacker, NP as Nurse Practitioner (Nurse Practitioner) Bonna Gains, MD as Referring Physician (Gastroenterology) Doreatha Massed, MD as Medical Oncologist (Medical Oncology) Therese Sarah, RN as Oncology Nurse Navigator (Medical Oncology) Clinton Gallant, RN as [facility] Care Management   ASSESSMENT & PLAN:   Assessment: 1. 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  Orderly maturation of erythroid and myeloid series. - Myeloma FISH panel: t(11;14) - Cytogenetics: Failed to grow metaphases - PET scan (11/18/2021): No evidence of active myeloma or plasmacytoma.  Right upper lobe lung infection. - Skull x-rays (10/29/2021) multiple discrete round lucent/lytic lesions in the skull - Worsening M spike and free light chain ratio.  Normal creatinine and calcium.  Multifactorial anemia including blood loss and bone marrow infiltration. - Cycle 1 of DRd started on 12/14/2021 with lenalidomide 15 mg 3 weeks on/1 week off and dexamethasone 20 mg weekly - Hospitalized from 09/26/2022 through 10/03/2022 with MSSA bacteremia, port removed, cefepime started, to continue until 10/26/2022   2.  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  Quit smoking 15 years ago.  No exposure to chemicals or pesticides. - Paternal grandmother had ovarian cancer.  Sister had ovarian cancer.   3.  Cirrhosis: -  Likely secondary to NAFLD with splenomegaly resulting in mild thrombocytopenia.  Seen by transplant hepatology service at [facility].    Plan: 1.  Stage II IgG kappa myeloma, standard risk: - Last treatment of Darzalex was on 04/13/2023.  She is tolerating Revlimid reasonably well. - She was diagnosed with flu a on 05/09/2023 and missed her last treatment.  Today she is feeling better.  She reports some left sided lateral chest wall pain when she moves which is improving.  Likely musculoskeletal from coughing. - Reviewed labs today: Normal LFTs and creatinine.  CBC with white count 2.3 and ANC of 1.0.  Platelets are 68.  Hemoglobin 11.3.  Myeloma panel from January 16 show M spike is stable at 0.3 g.  I thought of increasing her Revlimid dose but did not due to her low white count and platelet count. - She may proceed with Darzalex today.  Continue Revlimid 15 mg 3 weeks on/1 week off.  RTC 8 weeks for follow-up.   2.  Iron deficiency anemia from chronic GI bleed: - Continue Venofer 500 mg every 4 weeks.   3.  Infection prophylaxis: - Continue acyclovir 4 mg twice daily.  Aspirin on hold for bleeding.   4.  Cirrhosis: - Continue Lasix 20 mg as needed and carvedilol 6.5 mg daily.  She is not taking lactulose.   5.  Peripheral neuropathy: - Is mostly in the feet and stable.    Orders Placed This Encounter  Procedures   Comprehensive metabolic panel    Standing Status:   Future    Expected Date:   07/06/2023    Expiration Date:   07/05/2024   Magnesium    Standing Status:   Future  Expected Date:   07/06/2023    Expiration Date:   07/05/2024   CBC with Differential    Standing Status:   Future    Expected Date:   07/06/2023    Expiration Date:   07/05/2024   Comprehensive metabolic panel    Standing Status:   Future    Expected Date:   08/03/2023    Expiration Date:   08/02/2024   Magnesium    Standing Status:   Future    Expected Date:   08/03/2023    Expiration Date:   08/02/2024   CBC with  Differential    Standing Status:   Future    Expected Date:   08/03/2023    Expiration Date:   08/02/2024   Comprehensive metabolic panel    Standing Status:   Future    Expected Date:   08/31/2023    Expiration Date:   08/30/2024   Magnesium    Standing Status:   Future    Expected Date:   08/31/2023    Expiration Date:   08/30/2024   CBC with Differential    Standing Status:   Future    Expected Date:   08/31/2023    Expiration Date:   08/30/2024   Comprehensive metabolic panel    Standing Status:   Future    Expected Date:   09/28/2023    Expiration Date:   09/27/2024   Magnesium    Standing Status:   Future    Expected Date:   09/28/2023    Expiration Date:   09/27/2024   CBC with Differential    Standing Status:   Future    Expected Date:   09/28/2023    Expiration Date:   09/27/2024      Mikeal Hawthorne R Teague,acting as a scribe for Doreatha Massed, MD.,have documented all relevant documentation on the behalf of Doreatha Massed, MD,as directed by  Doreatha Massed, MD while in the presence of Doreatha Massed, MD.  I, Doreatha Massed MD, have reviewed the above documentation for accuracy and completeness, and I agree with the above.     Doreatha Massed, MD   2/27/20259:44 AM  CHIEF COMPLAINT:   Diagnosis: stage II standard risk IgG kappa plasma cell myeloma    Cancer Staging  Multiple myeloma (HCC) Staging form: Plasma Cell Myeloma and Plasma Cell Disorders, AJCC 8th Edition - Clinical stage from 11/30/2021: RISS Stage II (Beta-2-microglobulin (mg/L): 2.8, Albumin (g/dL): 3, ISS: Stage II, High-risk cytogenetics: Absent, LDH: Normal) - Signed by Doreatha Massed, MD on 11/30/2021    Prior Therapy: none  Current Therapy:  Daratumumab, lenalidomide and dexamethasone    HISTORY OF PRESENT ILLNESS:   Oncology History  Multiple myeloma (HCC)  11/30/2021 Initial Diagnosis   Multiple myeloma (HCC)   11/30/2021 Cancer Staging   Staging form: Plasma Cell Myeloma and  Plasma Cell Disorders, AJCC 8th Edition - Clinical stage from 11/30/2021: RISS Stage II (Beta-2-microglobulin (mg/L): 2.8, Albumin (g/dL): 3, ISS: Stage II, High-risk cytogenetics: Absent, LDH: Normal) - Signed by Doreatha Massed, MD on 11/30/2021 Histopathologic type: Multiple myeloma Stage prefix: Initial diagnosis Beta 2 microglobulin range (mg/L): Less than 3.5 Albumin range (g/dL): Less than 3.5 Cytogenetics: t(11;14) translocation Serum calcium level: Normal   12/14/2021 -  Chemotherapy   Patient is on Treatment Plan : MYELOMA  Daratumumab SQ + Lenalidomide + Dexamethasone (DaraRd) q28d        INTERVAL HISTORY:   Savannah Burns is a 73 y.o. female presenting to clinic today for follow up  of stage II standard risk IgG kappa plasma cell myeloma. She was last seen by me on 02/09/23.  Since her last visit, she had her port inserted on 04/12/23 by Dr. Robyne Peers. She also presented to the ED on 05/09/23 for SOB and wheezing, later found to have influenza A. She was given Duonebs, Prednisone, Augmentin, and Doxycycline. Savannah Burns temporarily stopped Revlimid while on antibiotics.  Today, she states that she is doing well overall. Her appetite level is at 100%. Her energy level is at 0%. She is accompanied by a family member.  Savannah Burns mostly feels back to normal after recovering from the flu. She is taking Revlimid as prescribed and denies any side effects from treatment. She is taking lasix prn and is not taking lactulose.   Savannah Burns reports pain in the left side, she attempted to treat with pain medication without improvement. Her pain has somewhat improved since recovering from the flu, as pain previously worsened with movement. She denies any trouble with urination. She occasionally has melena and was seen by her PCP for diarrhea with her occasional melena. She was given a fecal occult blood test, but has not yet completed it.   PAST MEDICAL HISTORY:   Past Medical History: Past Medical  History:  Diagnosis Date   Adrenal adenoma, left    Stable   Anxiety    Arthritis    bilateral hands   COPD (chronic obstructive pulmonary disease) (HCC)    Depression    Diabetes mellitus, type 2 (HCC) 08/12/2008   Qualifier: Diagnosis of  By: Dayton Martes MD, Talia     Dyspnea    Esophageal varices (HCC)    Grade II diastolic dysfunction    History of kidney stones    Hyperlipidemia    Hypertension    Lower back pain    Lower GI bleed 03/19/2020   Panic attacks    Pneumonia    currently taking antibiotic and prednisone for early stages of pneumonia   Pulmonary nodules    bilateral   Skin cancer    face    Surgical History: Past Surgical History:  Procedure Laterality Date   BIOPSY  04/07/2020   Procedure: BIOPSY;  Surgeon: Lanelle Bal, DO;  [facility]: [facility];  Service: Endoscopy;;   Breast Cystectomy  Right    CESAREAN SECTION     COLONOSCOPY WITH PROPOFOL N/A 01/25/2020   Dr. Marletta Lor: Nonbleeding internal hemorrhoids, diverticulosis, 5 mm polyp removed from the ascending colon, 10 mm polyp removed from the sigmoid colon, 30 mm polyp (tubulovillous adenoma with no high-grade dysplasia) removed from the transverse colon via piecemeal status post tattoo.  Other polyps were tubular adenomas.  3 month surveillance colonoscopy recommended.   COLONOSCOPY WITH PROPOFOL N/A 04/07/2020   Procedure: COLONOSCOPY WITH PROPOFOL;  Surgeon: Lanelle Bal, DO;  Location: [facility];  Service: Endoscopy;  Laterality: N/A;  3:00pm, pt knows new time per office   CYSTOSCOPY/URETEROSCOPY/HOLMIUM LASER/STENT PLACEMENT Bilateral 03/01/2019   Procedure: CYSTOSCOPY/RETROGRADEURETEROSCOPY/HOLMIUM LASER/STENT PLACEMENT;  Surgeon: Rene Paci, MD;  Location: [facility];  Service: Urology;  Laterality: Bilateral;  ONLY NEEDS 60 MIN   ESOPHAGOGASTRODUODENOSCOPY (EGD) WITH PROPOFOL N/A 01/25/2020   Dr. Marletta Lor: 4 columns grade 1 esophageal varices   ESOPHAGOGASTRODUODENOSCOPY (EGD)  WITH PROPOFOL N/A 05/18/2020   Procedure: ESOPHAGOGASTRODUODENOSCOPY (EGD) WITH PROPOFOL;  Surgeon: Lanelle Bal, DO;  [facility];  Service: Endoscopy;  Laterality: N/A;   ESOPHAGOGASTRODUODENOSCOPY (EGD) WITH PROPOFOL N/A 07/28/2020   Procedure: ESOPHAGOGASTRODUODENOSCOPY (EGD) WITH PROPOFOL;  Surgeon: Lanelle Bal, DO;  [facility];  Service: Endoscopy;  Laterality: N/A;  am or early PM due to givens capsule placement   GIVENS CAPSULE STUDY N/A 05/18/2020   Procedure: GIVENS CAPSULE STUDY;  Surgeon: Dolores Frame, MD;  [facility]: [facility];  Service: Gastroenterology;  Laterality: N/A;   GIVENS CAPSULE STUDY N/A 07/28/2020   Procedure: GIVENS CAPSULE STUDY;  Surgeon: Lanelle Bal, DO;  [facility];  Service: Endoscopy;  Laterality: N/A;   IR IMAGING GUIDED PORT INSERTION  12/02/2021   IRRIGATION AND DEBRIDEMENT ABSCESS Right 09/28/2022   Procedure: IRRIGATION AND DEBRIDEMENT ABSCESS;  Surgeon: Lewie Chamber, DO;  Location: [facility];  Service: General;  Laterality: Right;   POLYPECTOMY  01/25/2020   Procedure: POLYPECTOMY;  Surgeon: Lanelle Bal, DO;  [facility];  Service: Endoscopy;;   POLYPECTOMY  04/07/2020   Procedure: POLYPECTOMY INTESTINAL;  Surgeon: Lanelle Bal, DO;  [facility];  Service: Endoscopy;;   PORT-A-CATH REMOVAL Right 09/28/2022   Procedure: MINOR REMOVAL PORT-A-CATH;  Surgeon: Lewie Chamber, DO;  Location: [facility];  Service: General;  Laterality: Right;   PORTACATH PLACEMENT Right 04/12/2023   Procedure: INSERTION PORT-A-CATH, RIJ;  Surgeon: Lewie Chamber, DO;  Location: [facility];  Service: General;  Laterality: Right;   SKIN CANCER EXCISION     Face   SPINE SURGERY     SUBMUCOSAL TATTOO INJECTION  01/25/2020   Procedure: SUBMUCOSAL TATTOO INJECTION;  Surgeon: Lanelle Bal, DO;  [facility];  Service: Endoscopy;;    Social History: Social  History   Socioeconomic History   Marital status: Widowed    Spouse name: Not on file   Number of children: 2   Years of education: 14   Highest education level: Not on file  Occupational History   Occupation: Retired   Tobacco Use   Smoking status: Former    Current packs/day: 0.00    Average packs/day: 1.5 packs/day for 40.0 years (60.0 ttl pk-yrs)    Types: Cigarettes    Start date: 04/29/1975    Quit date: 04/29/2015    Years since quitting: 8.0   Smokeless tobacco: Never   Tobacco comments:    Quit smoking 04/2015- Previous 1.5 ppd smoker  Vaping Use   Vaping status: Never Used  Substance and Sexual Activity   Alcohol use: No    Alcohol/week: 0.0 standard drinks of alcohol   Drug use: No   Sexual activity: Not Currently    Birth control/protection: Post-menopausal  Other Topics Concern   Not on file  Social History Narrative   Her 56 year old granddaughter lives with her - one daughter lives nearby, but she doesn't have a good relationship with her. Has a great relationship with other daughter who lives 1.5 hrs away - talks to her daily on the phone.   Social Drivers of Corporate investment banker Strain: Low Risk  (01/27/2023)   Overall Financial Resource Strain (CARDIA)    Difficulty of Paying Living Expenses: Not hard at all  Recent Concern: Financial Resource Strain - High Risk (11/15/2022)   Overall Financial Resource Strain (CARDIA)    Difficulty of Paying Living Expenses: Very hard  Food Insecurity: No Food Insecurity (01/27/2023)   Hunger Vital Sign    Worried About Running Out of Food in the Last Year: Never true    Ran Out of Food in the Last Year: Never true  Recent  Concern: Food Insecurity - Food Insecurity Present (11/15/2022)   Hunger Vital Sign    Worried About Running Out of Food in the Last Year: Sometimes true    Ran Out of Food in the Last Year: Never true  Transportation Needs: No Transportation Needs (01/27/2023)   PRAPARE - Doctor, general practice (Medical): No    Lack of Transportation (Non-Medical): No  Physical Activity: Inactive (01/27/2023)   Exercise Vital Sign    Days of Exercise per Week: 0 days    Minutes of Exercise per Session: 0 min  Stress: No Stress Concern Present (01/27/2023)   [employer] of Occupational Health - Occupational Stress Questionnaire    Feeling of Stress : Not at all  Social Connections: Socially Isolated (01/27/2023)   Social Connection and Isolation Panel [NHANES]    Frequency of Communication with Friends and Family: More than three times a week    Frequency of Social Gatherings with Friends and Family: More than three times a week    Attends Religious Services: Never    Database administrator or Organizations: No    Attends Banker Meetings: Never    Marital Status: Widowed  Intimate Partner Violence: Not At Risk (01/27/2023)   Humiliation, Afraid, Rape, and Kick questionnaire    Fear of Current or Ex-Partner: No    Emotionally Abused: No    Physically Abused: No    Sexually Abused: No    Family History: Family History  Problem Relation Age of Onset   Diabetes Father    Heart disease Father 82       MI   Hypertension Father    Anemia Mother        Transfusion dependent   COPD Sister    Cancer Paternal Grandmother 86       Pancreatic    Current Medications:  Current Outpatient Medications:    acyclovir (ZOVIRAX) 400 MG tablet, Take 1 tablet (400 mg total) by mouth 2 (two) times daily., Disp: 60 tablet, Rfl: 6   albuterol (PROVENTIL) (2.5 MG/3ML) 0.083% nebulizer solution, Take 3 mLs (2.5 mg total) by nebulization every 6 (six) hours as needed for wheezing or shortness of breath., Disp: 150 mL, Rfl: 1   albuterol (VENTOLIN HFA) 108 (90 Base) MCG/ACT inhaler, Inhale 2 puffs into the lungs every 6 (six) hours as needed for wheezing or shortness of breath., Disp: 8 g, Rfl: 0   baclofen (LIORESAL) 10 MG tablet, Take 10 mg by mouth 2 (two) times daily.,  Disp: , Rfl:    Blood Glucose Calibration (TRUE METRIX LEVEL 1) Low SOLN, Use w/ glucose monitor Dx E11.9, Disp: 3 each, Rfl: 0   Blood Glucose Monitoring Suppl (TRUE METRIX AIR GLUCOSE METER) w/Device KIT, Check BS daily Dx E11.9, Disp: 1 kit, Rfl: 0   Cholecalciferol (VITAMIN D) 50 MCG (2000 UT) tablet, Take 2,000 Units by mouth daily., Disp: , Rfl:    clindamycin (CLEOCIN T) 1 % SWAB, Apply to affected areas under axilla twice daily, Disp: 180 each, Rfl: 3   clobetasol (TEMOVATE) 0.05 % external solution, Apply 1 Application topically 2 (two) times daily., Disp: , Rfl:    Cyanocobalamin (B-12 COMPLIANCE INJECTION) 1000 MCG/ML KIT, Inject 1,000 mcg as directed every 30 (thirty) days., Disp: , Rfl:    desvenlafaxine (PRISTIQ) 100 MG 24 hr tablet, Take 100 mg by mouth in the morning., Disp: , Rfl:    dexamethasone (DECADRON) 4 MG tablet, Take 5 tablets once every  4 weeks prior to darzalex, Disp: 20 tablet, Rfl: 3   esomeprazole (NEXIUM) 40 MG capsule, Take 1 capsule (40 mg total) by mouth daily., Disp: 30 capsule, Rfl: 2   fluticasone-salmeterol (ADVAIR) 100-50 MCG/ACT AEPB, Inhale 1 puff into the lungs 2 (two) times daily., Disp: , Rfl:    glucose blood (TRUE METRIX BLOOD GLUCOSE TEST) test strip, Check BS daily Dx E11.9, Disp: 100 each, Rfl: 3   lenalidomide (REVLIMID) 15 MG capsule, TAKE 1 CAPSULE BY MOUTH EVERY DAY FOR 21 DAYS ON THEN 7 DAYS OFF, Disp: 21 capsule, Rfl: 0   lidocaine-prilocaine (EMLA) cream, Apply 1 Application topically as needed. Apply a small amount to port a cath site and cover with plastic wrap 1 hour prior to infusion appointments, Disp: 30 g, Rfl: 3   lovastatin (MEVACOR) 20 MG tablet, Take 1 tablet (20 mg total) by mouth at bedtime., Disp: 90 tablet, Rfl: 3   mupirocin ointment (BACTROBAN) 2 %, Apply 1 Application topically 2 (two) times daily., Disp: 22 g, Rfl: 1   nystatin (MYCOSTATIN/NYSTOP) powder, Apply topically 2 (two) times daily., Disp: 15 g, Rfl: 1    oxyCODONE-acetaminophen (PERCOCET) 10-325 MG tablet, Take 1 tablet by mouth every 6 (six) hours as needed for pain., Disp: , Rfl:    OXYGEN, Inhale 3 L into the lungs continuous., Disp: , Rfl:    OZEMPIC, 0.25 OR 0.5 MG/DOSE, 2 MG/3ML SOPN, Inject 0.25 mg into the skin every 7 (seven) days., Disp: 9 mL, Rfl: 3   potassium chloride SA (KLOR-CON) 20 MEQ tablet, Take 2 tablets (40 mEq total) by mouth 2 (two) times daily. (Patient taking differently: Take 40 mEq by mouth daily.), Disp: 360 tablet, Rfl: 3   prochlorperazine (COMPAZINE) 10 MG tablet, Take 1 tablet (10 mg total) by mouth every 6 (six) hours as needed for nausea or vomiting., Disp: 30 tablet, Rfl: 6   TRUEplus Lancets 33G MISC, Check BS daily Dx E11.9, Disp: 100 each, Rfl: 3   venlafaxine XR (EFFEXOR-XR) 150 MG 24 hr capsule, Take 1 capsule (150 mg total) by mouth daily with breakfast., Disp: 30 capsule, Rfl: 2   ferrous sulfate 325 (65 FE) MG tablet, Take 1 tablet (325 mg total) by mouth daily with breakfast., Disp: 30 tablet, Rfl: 3   torsemide (DEMADEX) 20 MG tablet, Take 2.5 tablets (50 mg total) by mouth 2 (two) times daily., Disp: , Rfl:  No current facility-administered medications for this visit.  Facility-Administered Medications Ordered in Other Visits:    cyanocobalamin (VITAMIN B12) injection 1,000 mcg, 1,000 mcg, Intramuscular, Once, Doreatha Massed, MD   daratumumab-hyaluronidase-fihj [facility] FASPRO) 1800-30000 MG-UT/15ML chemo SQ injection 1,800 mg, 1,800 mg, Subcutaneous, Once, Doreatha Massed, MD   iron sucrose (VENOFER) 500 mg in sodium chloride 0.9 % 250 mL IVPB, 500 mg, Intravenous, Once, Doreatha Massed, MD   Allergies: Allergies  Allergen Reactions   Keflex [Cephalexin] Nausea And Vomiting     REVIEW OF SYSTEMS:   Review of Systems  Constitutional:  Negative for chills, fatigue and fever.  HENT:   Negative for lump/mass, mouth sores, nosebleeds, sore throat and trouble swallowing.   Eyes:   Negative for eye problems.  Respiratory:  Positive for shortness of breath. Negative for cough.   Cardiovascular:  Negative for chest pain, leg swelling and palpitations.  Gastrointestinal:  Positive for abdominal pain (left side, 6/10 severity). Negative for constipation, diarrhea, nausea and vomiting.  Genitourinary:  Negative for bladder incontinence, difficulty urinating, dysuria, frequency, hematuria and nocturia.  Musculoskeletal:  Negative for arthralgias, back pain, flank pain, myalgias and neck pain.  Skin:  Negative for itching and rash.  Neurological:  Negative for dizziness, headaches and numbness.  Hematological:  Does not bruise/bleed easily.  Psychiatric/Behavioral:  Negative for depression, sleep disturbance and suicidal ideas. The patient is not nervous/anxious.   All other systems reviewed and are negative.    VITALS:   Weight (!) 301 lb 13 oz (136.9 kg).  Wt Readings from Last 3 Encounters:  05/25/23 (!) 301 lb 13 oz (136.9 kg)  05/15/23 296 lb (134.3 kg)  05/09/23 (!) 304 lb 3.8 oz (138 kg)    Body mass index is 55.2 kg/m.  Performance status (ECOG): 1 - Symptomatic but completely ambulatory  PHYSICAL EXAM:   Physical Exam Vitals and nursing note reviewed. Exam conducted with a chaperone present.  Constitutional:      Appearance: Normal appearance.  Cardiovascular:     Rate and Rhythm: Normal rate and regular rhythm.     Pulses: Normal pulses.     Heart sounds: Normal heart sounds.  Pulmonary:     Effort: Pulmonary effort is normal.     Breath sounds: Normal breath sounds.  Abdominal:     Palpations: Abdomen is soft. There is no hepatomegaly, splenomegaly or mass.     Tenderness: There is no abdominal tenderness.       Comments: +pain along left side  Musculoskeletal:     Right lower leg: No edema.     Left lower leg: No edema.  Lymphadenopathy:     Cervical: No cervical adenopathy.     Right cervical: No superficial, deep or posterior cervical  adenopathy.    Left cervical: No superficial, deep or posterior cervical adenopathy.     Upper Body:     Right upper body: No supraclavicular or axillary adenopathy.     Left upper body: No supraclavicular or axillary adenopathy.  Neurological:     General: No focal deficit present.     Mental Status: She is alert and oriented to person, place, and time.  Psychiatric:        Mood and Affect: Mood normal.        Behavior: Behavior normal.     LABS:      Latest Ref Rng & Units 05/25/2023    8:10 AM 05/15/2023    9:28 AM 05/09/2023    1:37 PM  CBC  WBC 4.0 - 10.5 K/uL 2.3  4.8  3.4   Hemoglobin 12.0 - 15.0 g/dL 16.1  09.6  04.5   Hematocrit 36.0 - 46.0 % 35.0  36.7  35.3   Platelets 150 - 400 K/uL 68  109  52       Latest Ref Rng & Units 05/25/2023    8:10 AM 05/15/2023    9:28 AM 05/09/2023    1:37 PM  CMP  Glucose 70 - 99 mg/dL 409  811  914   BUN 8 - 23 mg/dL 9  11  11    Creatinine 0.44 - 1.00 mg/dL 7.82  9.56  2.13   Sodium 135 - 145 mmol/L 138  146  141   Potassium 3.5 - 5.1 mmol/L 3.6  3.5  3.1   Chloride 98 - 111 mmol/L 100  103  102   CO2 22 - 32 mmol/L 30  25  32   Calcium 8.9 - 10.3 mg/dL 8.5  8.9  8.3   Total Protein 6.5 - 8.1 g/dL 5.5   5.6  Total Bilirubin 0.0 - 1.2 mg/dL 0.6   0.8   Alkaline Phos 38 - 126 U/L 121   89   AST 15 - 41 U/L 20   17   ALT 0 - 44 U/L 17   16      No results found for: "CEA1", "CEA" / No results found for: "CEA1", "CEA" No results found for: "PSA1" No results found for: "NUU725" No results found for: "CAN125"  Lab Results  Component Value Date   TOTALPROTELP 5.3 (L) 04/13/2023   ALBUMINELP 3.4 04/13/2023   A1GS 0.2 04/13/2023   A2GS 0.6 04/13/2023   BETS 0.7 04/13/2023   GAMS 0.4 04/13/2023   MSPIKE 0.3 (H) 04/13/2023   SPEI Comment 04/13/2023   Lab Results  Component Value Date   TIBC 294 04/13/2023   TIBC 285 02/09/2023   TIBC 323 01/12/2023   FERRITIN 858 (H) 04/13/2023   FERRITIN 578 (H) 02/09/2023   FERRITIN  441 (H) 01/12/2023   IRONPCTSAT 32 (H) 04/13/2023   IRONPCTSAT 136 (H) 02/09/2023   IRONPCTSAT 28 01/12/2023   Lab Results  Component Value Date   LDH 92 (L) 10/22/2021   LDH 101 06/25/2021   LDH 103 03/24/2021     STUDIES:   DG Chest 2 View Result Date: 05/09/2023 CLINICAL DATA:  Shortness of breath. EXAM: CHEST - 2 VIEW COMPARISON:  05/04/2023. FINDINGS: Diffuse mild-to-moderate pulmonary vascular congestion/edema. There are left retrocardiac airspace opacity partially obscuring the left medial hemidiaphragm and descending thoracic aorta, suggesting combination of left lung atelectasis and/or consolidation. Bilateral lung fields are otherwise clear. Bilateral costophrenic angles are clear. Stable cardio-mediastinal silhouette. No acute osseous abnormalities. The soft tissues are within normal limits. Right-sided CT Port-A-Cath is seen with its tip overlying the upper portion of superior vena cava. IMPRESSION: *Findings favor congestive heart failure/pulmonary edema. *There is additional left retrocardiac opacity, as described above. Electronically Signed   By: Jules Schick M.D.   On: 05/09/2023 13:58   DG Chest 2 View Result Date: 05/09/2023 CLINICAL DATA:  Shortness of breath.  Wheezing. EXAM: CHEST - 2 VIEW COMPARISON:  04/12/2023 and 09/26/2022 FINDINGS: Stable mild cardiomegaly. Right IJ Port-A-Cath remains in appropriate position. Chronic pulmonary interstitial prominence is stable as well as mild scarring at the left lung base. No evidence of acute infiltrate or pleural effusion. IMPRESSION: Stable mild cardiomegaly and chronic pulmonary interstitial prominence. No acute findings. Electronically Signed   By: Danae Orleans M.D.   On: 05/09/2023 08:24

## 2023-05-25 ENCOUNTER — Inpatient Hospital Stay: Payer: Medicare PPO

## 2023-05-25 ENCOUNTER — Inpatient Hospital Stay (HOSPITAL_BASED_OUTPATIENT_CLINIC_OR_DEPARTMENT_OTHER): Payer: Medicare PPO | Admitting: Hematology

## 2023-05-25 ENCOUNTER — Inpatient Hospital Stay: Payer: Medicare PPO | Attending: Hematology

## 2023-05-25 VITALS — BP 159/87 | HR 89 | Temp 98.2°F

## 2023-05-25 VITALS — BP 154/72 | HR 72 | Temp 97.6°F | Resp 19

## 2023-05-25 VITALS — Wt 301.8 lb

## 2023-05-25 DIAGNOSIS — D500 Iron deficiency anemia secondary to blood loss (chronic): Secondary | ICD-10-CM | POA: Diagnosis not present

## 2023-05-25 DIAGNOSIS — Z95828 Presence of other vascular implants and grafts: Secondary | ICD-10-CM

## 2023-05-25 DIAGNOSIS — C9000 Multiple myeloma not having achieved remission: Secondary | ICD-10-CM

## 2023-05-25 DIAGNOSIS — Z7951 Long term (current) use of inhaled steroids: Secondary | ICD-10-CM | POA: Diagnosis not present

## 2023-05-25 DIAGNOSIS — Z7985 Long-term (current) use of injectable non-insulin antidiabetic drugs: Secondary | ICD-10-CM | POA: Diagnosis not present

## 2023-05-25 DIAGNOSIS — Z85828 Personal history of other malignant neoplasm of skin: Secondary | ICD-10-CM | POA: Diagnosis not present

## 2023-05-25 DIAGNOSIS — Z87891 Personal history of nicotine dependence: Secondary | ICD-10-CM | POA: Diagnosis not present

## 2023-05-25 DIAGNOSIS — Z7961 Long term (current) use of immunomodulator: Secondary | ICD-10-CM | POA: Diagnosis not present

## 2023-05-25 DIAGNOSIS — K7460 Unspecified cirrhosis of liver: Secondary | ICD-10-CM | POA: Insufficient documentation

## 2023-05-25 DIAGNOSIS — J449 Chronic obstructive pulmonary disease, unspecified: Secondary | ICD-10-CM | POA: Diagnosis not present

## 2023-05-25 DIAGNOSIS — Z87442 Personal history of urinary calculi: Secondary | ICD-10-CM | POA: Diagnosis not present

## 2023-05-25 DIAGNOSIS — R197 Diarrhea, unspecified: Secondary | ICD-10-CM | POA: Diagnosis not present

## 2023-05-25 DIAGNOSIS — E785 Hyperlipidemia, unspecified: Secondary | ICD-10-CM | POA: Diagnosis not present

## 2023-05-25 DIAGNOSIS — Z86018 Personal history of other benign neoplasm: Secondary | ICD-10-CM | POA: Insufficient documentation

## 2023-05-25 DIAGNOSIS — I110 Hypertensive heart disease with heart failure: Secondary | ICD-10-CM | POA: Insufficient documentation

## 2023-05-25 DIAGNOSIS — E1142 Type 2 diabetes mellitus with diabetic polyneuropathy: Secondary | ICD-10-CM | POA: Diagnosis not present

## 2023-05-25 DIAGNOSIS — Z5112 Encounter for antineoplastic immunotherapy: Secondary | ICD-10-CM | POA: Insufficient documentation

## 2023-05-25 DIAGNOSIS — Z79899 Other long term (current) drug therapy: Secondary | ICD-10-CM | POA: Diagnosis not present

## 2023-05-25 DIAGNOSIS — Z8041 Family history of malignant neoplasm of ovary: Secondary | ICD-10-CM | POA: Diagnosis not present

## 2023-05-25 DIAGNOSIS — Z79624 Long term (current) use of inhibitors of nucleotide synthesis: Secondary | ICD-10-CM | POA: Diagnosis not present

## 2023-05-25 DIAGNOSIS — R918 Other nonspecific abnormal finding of lung field: Secondary | ICD-10-CM | POA: Diagnosis not present

## 2023-05-25 LAB — CBC WITH DIFFERENTIAL/PLATELET
Abs Immature Granulocytes: 0.01 10*3/uL (ref 0.00–0.07)
Basophils Absolute: 0 10*3/uL (ref 0.0–0.1)
Basophils Relative: 0 %
Eosinophils Absolute: 0 10*3/uL (ref 0.0–0.5)
Eosinophils Relative: 2 %
HCT: 35 % — ABNORMAL LOW (ref 36.0–46.0)
Hemoglobin: 11.3 g/dL — ABNORMAL LOW (ref 12.0–15.0)
Immature Granulocytes: 0 %
Lymphocytes Relative: 33 %
Lymphs Abs: 0.8 10*3/uL (ref 0.7–4.0)
MCH: 33.4 pg (ref 26.0–34.0)
MCHC: 32.3 g/dL (ref 30.0–36.0)
MCV: 103.6 fL — ABNORMAL HIGH (ref 80.0–100.0)
Monocytes Absolute: 0.4 10*3/uL (ref 0.1–1.0)
Monocytes Relative: 19 %
Neutro Abs: 1 10*3/uL — ABNORMAL LOW (ref 1.7–7.7)
Neutrophils Relative %: 46 %
Platelets: 68 10*3/uL — ABNORMAL LOW (ref 150–400)
RBC: 3.38 MIL/uL — ABNORMAL LOW (ref 3.87–5.11)
RDW: 14 % (ref 11.5–15.5)
WBC: 2.3 10*3/uL — ABNORMAL LOW (ref 4.0–10.5)
nRBC: 0 % (ref 0.0–0.2)

## 2023-05-25 LAB — COMPREHENSIVE METABOLIC PANEL
ALT: 17 U/L (ref 0–44)
AST: 20 U/L (ref 15–41)
Albumin: 3.2 g/dL — ABNORMAL LOW (ref 3.5–5.0)
Alkaline Phosphatase: 121 U/L (ref 38–126)
Anion gap: 8 (ref 5–15)
BUN: 9 mg/dL (ref 8–23)
CO2: 30 mmol/L (ref 22–32)
Calcium: 8.5 mg/dL — ABNORMAL LOW (ref 8.9–10.3)
Chloride: 100 mmol/L (ref 98–111)
Creatinine, Ser: 0.52 mg/dL (ref 0.44–1.00)
GFR, Estimated: >60 mL/min (ref >60)
Glucose, Bld: 197 mg/dL — ABNORMAL HIGH (ref 70–99)
Potassium: 3.6 mmol/L (ref 3.5–5.1)
Sodium: 138 mmol/L (ref 135–145)
Total Bilirubin: 0.6 mg/dL (ref 0.0–1.2)
Total Protein: 5.5 g/dL — ABNORMAL LOW (ref 6.5–8.1)

## 2023-05-25 LAB — MAGNESIUM: Magnesium: 1.7 mg/dL (ref 1.7–2.4)

## 2023-05-25 MED ORDER — SODIUM CHLORIDE 0.9 % IV SOLN
500.0000 mg | Freq: Once | INTRAVENOUS | Status: AC
  Administered 2023-05-25: 500 mg via INTRAVENOUS
  Filled 2023-05-25: qty 20

## 2023-05-25 MED ORDER — SODIUM CHLORIDE 0.9% FLUSH
10.0000 mL | INTRAVENOUS | Status: DC | PRN
  Administered 2023-05-25: 10 mL via INTRAVENOUS

## 2023-05-25 MED ORDER — DARATUMUMAB-HYALURONIDASE-FIHJ 1800-30000 MG-UT/15ML ~~LOC~~ SOLN
1800.0000 mg | Freq: Once | SUBCUTANEOUS | Status: AC
  Administered 2023-05-25: 1800 mg via SUBCUTANEOUS
  Filled 2023-05-25: qty 15

## 2023-05-25 MED ORDER — SODIUM CHLORIDE 0.9 % IV SOLN: Freq: Once | INTRAVENOUS | Status: AC

## 2023-05-25 MED ORDER — HEPARIN SOD (PORK) LOCK FLUSH 100 UNIT/ML IV SOLN
500.0000 [IU] | Freq: Once | INTRAVENOUS | Status: AC
  Administered 2023-05-25: 500 [IU] via INTRAVENOUS

## 2023-05-25 MED ORDER — CYANOCOBALAMIN 1000 MCG/ML IJ SOLN
1000.0000 ug | Freq: Once | INTRAMUSCULAR | Status: AC
  Administered 2023-05-25: 1000 ug via INTRAMUSCULAR
  Filled 2023-05-25: qty 1

## 2023-05-25 MED ORDER — SODIUM CHLORIDE 0.9% FLUSH
10.0000 mL | INTRAVENOUS | Status: DC | PRN
Start: 2023-05-25 — End: 2023-05-25
  Administered 2023-05-25: 10 mL via INTRAVENOUS

## 2023-05-25 NOTE — Progress Notes (Signed)
 Patient presents today for Darzalex Faspro/Vitamin B12/Venofer 500mg  infusion. Patient is in satisfactory condition with no new complaints voiced.  Vital signs are stable.  Labs reviewed by Dr. Ellin Saba during the office visit and all other labs are within treatment parameters. Patient's ANC 1.0 and Platelets 68. Dr.Katragadda made aware.  We will proceed with treatment per MD orders.   Patient took pre-med at home prior to arrival.  Treatment given today per MD orders. Tolerated infusion without adverse affects. Vital signs stable. No complaints at this time. Discharged from clinic via wheelchair in stable condition. Alert and oriented x 3. F/U with [facility] as scheduled.

## 2023-05-25 NOTE — Patient Instructions (Signed)

## 2023-05-25 NOTE — Patient Instructions (Signed)
 [facility] [place] - A DEPT OF [facility]  Discharge Instructions: Thank you for choosing [facility] to provide your oncology and hematology care.  If you have a lab appointment with the Cancer Center - please note that after April 8th, 2024, all labs will be drawn in the cancer center.  You do not have to check in or register with the main entrance as you have in the past but will complete your check-in in the cancer center.  Wear comfortable clothing and clothing appropriate for easy access to any Portacath or PICC line.   We strive to give you quality time with your provider. You may need to reschedule your appointment if you arrive late (15 or more minutes).  Arriving late affects you and other patients whose appointments are after yours.  Also, if you miss three or more appointments without notifying the office, you may be dismissed from the clinic at the provider's discretion.      For prescription refill requests, have your pharmacy contact our office and allow 72 hours for refills to be completed.    Today you received the following chemotherapy and/or immunotherapy agents [facility]/Venofer IV iron/B12 injection   To help prevent nausea and vomiting after your treatment, we encourage you to take your nausea medication as directed.  Daratumumab; Hyaluronidase Injection What is this medication? DARATUMUMAB; HYALURONIDASE (dar a toom ue mab; hye al ur ON i dase) treats multiple myeloma, a type of bone marrow cancer. Daratumumab works by blocking a protein that causes cancer cells to grow and multiply. This helps to slow or stop the spread of cancer cells. Hyaluronidase works by increasing the absorption of other medications in the body to help them work better. This medication may also be used treat amyloidosis, a condition that causes the buildup of a protein (amyloid) in your body. It works by reducing the buildup of this protein, which decreases  symptoms. It is a combination medication that contains a monoclonal antibody. This medicine may be used for other purposes; ask your health care provider or pharmacist if you have questions. COMMON BRAND NAME(S): DARZALEX FASPRO What should I tell my care team before I take this medication? They need to know if you have any of these conditions: Heart disease Infection, such as chickenpox, cold sores, herpes, hepatitis B Lung or breathing disease An unusual or allergic reaction to daratumumab, hyaluronidase, other medications, foods, dyes, or preservatives Pregnant or trying to get pregnant Breast-feeding How should I use this medication? This medication is injected under the skin. It is given by your care team in a hospital or clinic setting. Talk to your care team about the use of this medication in children. Special care may be needed. Overdosage: If you think you have taken too much of this medicine contact a poison control center or emergency room at once. NOTE: This medicine is only for you. Do not share this medicine with others. What if I miss a dose? Keep appointments for follow-up doses. It is important not to miss your dose. Call your care team if you are unable to keep an appointment. What may interact with this medication? Interactions have not been studied. This list may not describe all possible interactions. Give your health care provider a list of all the medicines, herbs, non-prescription drugs, or dietary supplements you use. Also tell them if you smoke, drink alcohol, or use illegal drugs. Some items may interact with your medicine. What should  I watch for while using this medication? Your condition will be monitored carefully while you are receiving this medication. This medication can cause serious allergic reactions. To reduce your risk, your care team may give you other medication to take before receiving this one. Be sure to follow the directions from your care  team. This medication can affect the results of blood tests to match your blood type. These changes can last for up to 6 months after the final dose. Your care team will do blood tests to match your blood type before you start treatment. Tell all of your care team that you are being treated with this medication before receiving a blood transfusion. This medication can affect the results of some tests used to determine treatment response; extra tests may be needed to evaluate response. Talk to your care team if you wish to become pregnant or think you are pregnant. This medication can cause serious birth defects if taken during pregnancy and for 3 months after the last dose. A reliable form of contraception is recommended while taking this medication and for 3 months after the last dose. Talk to your care team about effective forms of contraception. Do not breast-feed while taking this medication. What side effects may I notice from receiving this medication? Side effects that you should report to your care team as soon as possible: Allergic reactions--skin rash, itching, hives, swelling of the face, lips, tongue, or throat Heart rhythm changes--fast or irregular heartbeat, dizziness, feeling faint or lightheaded, chest pain, trouble breathing Infection--fever, chills, cough, sore throat, wounds that don't heal, pain or trouble when passing urine, general feeling of discomfort or being unwell Infusion reactions--chest pain, shortness of breath or trouble breathing, feeling faint or lightheaded Sudden eye pain or change in vision such as blurry vision, seeing halos around lights, vision loss Unusual bruising or bleeding Side effects that usually do not require medical attention (report to your care team if they continue or are bothersome): Constipation Diarrhea Fatigue Nausea Pain, tingling, or numbness in the hands or feet Swelling of the ankles, hands, or feet This list may not describe all  possible side effects. Call your doctor for medical advice about side effects. You may report side effects to FDA at 1-800-FDA-1088. Where should I keep my medication? This medication is given in a hospital or clinic. It will not be stored at home. NOTE: This sheet is a summary. It may not cover all possible information. If you have questions about this medicine, talk to your doctor, pharmacist, or health care provider.  2024 Elsevier/Gold Standard (2021-07-20 00:00:00)  BELOW ARE SYMPTOMS THAT SHOULD BE REPORTED IMMEDIATELY: *FEVER GREATER THAN 100.4 F (38 C) OR HIGHER *CHILLS OR SWEATING *NAUSEA AND VOMITING THAT IS NOT CONTROLLED WITH YOUR NAUSEA MEDICATION *UNUSUAL SHORTNESS OF BREATH *UNUSUAL BRUISING OR BLEEDING *URINARY PROBLEMS (pain or burning when urinating, or frequent urination) *BOWEL PROBLEMS (unusual diarrhea, constipation, pain near the anus) TENDERNESS IN MOUTH AND THROAT WITH OR WITHOUT PRESENCE OF ULCERS (sore throat, sores in mouth, or a toothache) UNUSUAL RASH, SWELLING OR PAIN  UNUSUAL VAGINAL DISCHARGE OR ITCHING   Items with * indicate a potential emergency and should be followed up as soon as possible or go to the Emergency Department if any problems should occur.  Please show the CHEMOTHERAPY ALERT CARD or IMMUNOTHERAPY ALERT CARD at check-in to the Emergency Department and triage nurse.  Should you have questions after your visit or need to cancel or reschedule your appointment, please contact [facility]  [facility] [place] - A DEPT OF Eligh[facility] Brideg[facility] [phone]  and follow the prompts.  Office hours are 8:00 a.m. to 4:30 p.m. Monday - Friday. Please note that voicemails left after 4:00 p.m. may not be returned until the following business day.  We are closed weekends and major holidays. You have access to a nurse at all times for urgent questions. Please call the main number to the clinic [phone] and follow the prompts.  For any non-urgent  questions, you may also contact your provider using MyChart. We now offer e-Visits for anyone 49 and older to request care online for non-urgent symptoms. For details visit [email or web address].   Also download the MyChart app! Go to the app store, search "MyChart", open the app, select [place], and log in with your MyChart username and password.

## 2023-05-25 NOTE — Progress Notes (Signed)
 Patients port flushed without difficulty.  Good blood return noted with no bruising or swelling noted at site.  Patient remains accessed for treatment.

## 2023-05-25 NOTE — Progress Notes (Signed)
Patient is taking Revlimid as prescribed.  She has not missed any doses and reports no side effects at this time.   

## 2023-05-26 DIAGNOSIS — J449 Chronic obstructive pulmonary disease, unspecified: Secondary | ICD-10-CM | POA: Diagnosis not present

## 2023-05-26 LAB — KAPPA/LAMBDA LIGHT CHAINS
Kappa free light chain: 12 mg/L (ref 3.3–19.4)
Kappa, lambda light chain ratio: 2.14 — ABNORMAL HIGH (ref 0.26–1.65)
Lambda free light chains: 5.6 mg/L — ABNORMAL LOW (ref 5.7–26.3)

## 2023-05-29 LAB — PROTEIN ELECTROPHORESIS, SERUM
A/G Ratio: 1.5 (ref 0.7–1.7)
Albumin ELP: 3.2 g/dL (ref 2.9–4.4)
Alpha-1-Globulin: 0.3 g/dL (ref 0.0–0.4)
Alpha-2-Globulin: 0.7 g/dL (ref 0.4–1.0)
Beta Globulin: 0.8 g/dL (ref 0.7–1.3)
Gamma Globulin: 0.5 g/dL (ref 0.4–1.8)
Globulin, Total: 2.2 g/dL (ref 2.2–3.9)
M-Spike, %: 0.3 g/dL — ABNORMAL HIGH
Total Protein ELP: 5.4 g/dL — ABNORMAL LOW (ref 6.0–8.5)

## 2023-06-05 ENCOUNTER — Other Ambulatory Visit: Payer: Self-pay | Admitting: Hematology

## 2023-06-05 DIAGNOSIS — C9000 Multiple myeloma not having achieved remission: Secondary | ICD-10-CM

## 2023-06-06 ENCOUNTER — Encounter: Payer: Self-pay | Admitting: Hematology

## 2023-06-06 ENCOUNTER — Encounter (HOSPITAL_COMMUNITY): Payer: Self-pay | Admitting: Hematology

## 2023-06-06 ENCOUNTER — Other Ambulatory Visit: Payer: Self-pay

## 2023-06-06 DIAGNOSIS — C9000 Multiple myeloma not having achieved remission: Secondary | ICD-10-CM

## 2023-06-06 MED ORDER — LENALIDOMIDE 15 MG PO CAPS: ORAL_CAPSULE | ORAL | 0 refills | Status: DC

## 2023-06-06 NOTE — Telephone Encounter (Signed)
 Chart reviewed. Revlimid refilled per last office note with Dr. Ellin Saba.

## 2023-06-07 DIAGNOSIS — C9000 Multiple myeloma not having achieved remission: Secondary | ICD-10-CM | POA: Diagnosis not present

## 2023-06-07 DIAGNOSIS — E119 Type 2 diabetes mellitus without complications: Secondary | ICD-10-CM | POA: Diagnosis not present

## 2023-06-07 DIAGNOSIS — D696 Thrombocytopenia, unspecified: Secondary | ICD-10-CM | POA: Diagnosis not present

## 2023-06-07 DIAGNOSIS — M51369 Other intervertebral disc degeneration, lumbar region without mention of lumbar back pain or lower extremity pain: Secondary | ICD-10-CM | POA: Diagnosis not present

## 2023-06-07 DIAGNOSIS — I10 Essential (primary) hypertension: Secondary | ICD-10-CM | POA: Diagnosis not present

## 2023-06-07 DIAGNOSIS — Z6841 Body Mass Index (BMI) 40.0 and over, adult: Secondary | ICD-10-CM | POA: Diagnosis not present

## 2023-06-07 DIAGNOSIS — R030 Elevated blood-pressure reading, without diagnosis of hypertension: Secondary | ICD-10-CM | POA: Diagnosis not present

## 2023-06-07 DIAGNOSIS — F1120 Opioid dependence, uncomplicated: Secondary | ICD-10-CM | POA: Diagnosis not present

## 2023-06-09 ENCOUNTER — Other Ambulatory Visit: Payer: Self-pay

## 2023-06-09 ENCOUNTER — Encounter (HOSPITAL_COMMUNITY): Payer: Self-pay | Admitting: Emergency Medicine

## 2023-06-09 ENCOUNTER — Inpatient Hospital Stay (HOSPITAL_COMMUNITY)
Admission: EM | Admit: 2023-06-09 | Discharge: 2023-06-13 | DRG: 291 | Disposition: A | Discharge status: Home Health | Attending: Family Medicine | Admitting: Family Medicine

## 2023-06-09 ENCOUNTER — Emergency Department (HOSPITAL_COMMUNITY)

## 2023-06-09 DIAGNOSIS — T501X6A Underdosing of loop [high-ceiling] diuretics, initial encounter: Secondary | ICD-10-CM | POA: Diagnosis present

## 2023-06-09 DIAGNOSIS — Z79899 Other long term (current) drug therapy: Secondary | ICD-10-CM

## 2023-06-09 DIAGNOSIS — K7581 Nonalcoholic steatohepatitis (NASH): Secondary | ICD-10-CM | POA: Diagnosis present

## 2023-06-09 DIAGNOSIS — R601 Generalized edema: Secondary | ICD-10-CM

## 2023-06-09 DIAGNOSIS — E785 Hyperlipidemia, unspecified: Secondary | ICD-10-CM | POA: Diagnosis present

## 2023-06-09 DIAGNOSIS — Z825 Family history of asthma and other chronic lower respiratory diseases: Secondary | ICD-10-CM

## 2023-06-09 DIAGNOSIS — Z85828 Personal history of other malignant neoplasm of skin: Secondary | ICD-10-CM

## 2023-06-09 DIAGNOSIS — Z7985 Long-term (current) use of injectable non-insulin antidiabetic drugs: Secondary | ICD-10-CM

## 2023-06-09 DIAGNOSIS — E66813 Obesity, class 3: Secondary | ICD-10-CM | POA: Diagnosis present

## 2023-06-09 DIAGNOSIS — C9000 Multiple myeloma not having achieved remission: Secondary | ICD-10-CM | POA: Diagnosis not present

## 2023-06-09 DIAGNOSIS — J441 Chronic obstructive pulmonary disease with (acute) exacerbation: Secondary | ICD-10-CM | POA: Diagnosis present

## 2023-06-09 DIAGNOSIS — I10 Essential (primary) hypertension: Secondary | ICD-10-CM | POA: Diagnosis present

## 2023-06-09 DIAGNOSIS — K7460 Unspecified cirrhosis of liver: Secondary | ICD-10-CM | POA: Diagnosis present

## 2023-06-09 DIAGNOSIS — Z7951 Long term (current) use of inhaled steroids: Secondary | ICD-10-CM

## 2023-06-09 DIAGNOSIS — Z87442 Personal history of urinary calculi: Secondary | ICD-10-CM

## 2023-06-09 DIAGNOSIS — E876 Hypokalemia: Secondary | ICD-10-CM | POA: Diagnosis not present

## 2023-06-09 DIAGNOSIS — E1169 Type 2 diabetes mellitus with other specified complication: Secondary | ICD-10-CM

## 2023-06-09 DIAGNOSIS — D696 Thrombocytopenia, unspecified: Secondary | ICD-10-CM | POA: Diagnosis not present

## 2023-06-09 DIAGNOSIS — Z66 Do not resuscitate: Secondary | ICD-10-CM | POA: Diagnosis not present

## 2023-06-09 DIAGNOSIS — Z91148 Patient's other noncompliance with medication regimen for other reason: Secondary | ICD-10-CM

## 2023-06-09 DIAGNOSIS — Z87891 Personal history of nicotine dependence: Secondary | ICD-10-CM | POA: Diagnosis not present

## 2023-06-09 DIAGNOSIS — I509 Heart failure, unspecified: Secondary | ICD-10-CM | POA: Diagnosis not present

## 2023-06-09 DIAGNOSIS — B974 Respiratory syncytial virus as the cause of diseases classified elsewhere: Secondary | ICD-10-CM | POA: Diagnosis present

## 2023-06-09 DIAGNOSIS — M549 Dorsalgia, unspecified: Secondary | ICD-10-CM | POA: Diagnosis present

## 2023-06-09 DIAGNOSIS — K5900 Constipation, unspecified: Secondary | ICD-10-CM | POA: Diagnosis not present

## 2023-06-09 DIAGNOSIS — J9611 Chronic respiratory failure with hypoxia: Secondary | ICD-10-CM | POA: Diagnosis present

## 2023-06-09 DIAGNOSIS — Z9981 Dependence on supplemental oxygen: Secondary | ICD-10-CM | POA: Diagnosis not present

## 2023-06-09 DIAGNOSIS — Z8249 Family history of ischemic heart disease and other diseases of the circulatory system: Secondary | ICD-10-CM | POA: Diagnosis not present

## 2023-06-09 DIAGNOSIS — J449 Chronic obstructive pulmonary disease, unspecified: Secondary | ICD-10-CM | POA: Diagnosis present

## 2023-06-09 DIAGNOSIS — E119 Type 2 diabetes mellitus without complications: Secondary | ICD-10-CM

## 2023-06-09 DIAGNOSIS — I700 Atherosclerosis of aorta: Secondary | ICD-10-CM | POA: Diagnosis not present

## 2023-06-09 DIAGNOSIS — J9621 Acute and chronic respiratory failure with hypoxia: Principal | ICD-10-CM | POA: Diagnosis present

## 2023-06-09 DIAGNOSIS — R918 Other nonspecific abnormal finding of lung field: Secondary | ICD-10-CM | POA: Diagnosis not present

## 2023-06-09 DIAGNOSIS — I110 Hypertensive heart disease with heart failure: Principal | ICD-10-CM | POA: Diagnosis present

## 2023-06-09 DIAGNOSIS — I5033 Acute on chronic diastolic (congestive) heart failure: Secondary | ICD-10-CM | POA: Diagnosis present

## 2023-06-09 DIAGNOSIS — K219 Gastro-esophageal reflux disease without esophagitis: Secondary | ICD-10-CM

## 2023-06-09 DIAGNOSIS — Z860100 Personal history of colon polyps, unspecified: Secondary | ICD-10-CM

## 2023-06-09 DIAGNOSIS — Z6841 Body Mass Index (BMI) 40.0 and over, adult: Secondary | ICD-10-CM | POA: Diagnosis not present

## 2023-06-09 DIAGNOSIS — R0602 Shortness of breath: Secondary | ICD-10-CM | POA: Diagnosis not present

## 2023-06-09 DIAGNOSIS — Z881 Allergy status to other antibiotic agents status: Secondary | ICD-10-CM

## 2023-06-09 DIAGNOSIS — Z833 Family history of diabetes mellitus: Secondary | ICD-10-CM | POA: Diagnosis not present

## 2023-06-09 LAB — COMPREHENSIVE METABOLIC PANEL
ALT: 33 U/L (ref 0–44)
AST: 36 U/L (ref 15–41)
Albumin: 4.1 g/dL (ref 3.5–5.0)
Alkaline Phosphatase: 134 U/L — ABNORMAL HIGH (ref 38–126)
Anion gap: 12 (ref 5–15)
BUN: 14 mg/dL (ref 8–23)
CO2: 33 mmol/L — ABNORMAL HIGH (ref 22–32)
Calcium: 8.6 mg/dL — ABNORMAL LOW (ref 8.9–10.3)
Chloride: 96 mmol/L — ABNORMAL LOW (ref 98–111)
Creatinine, Ser: 0.7 mg/dL (ref 0.44–1.00)
GFR, Estimated: >60 mL/min (ref >60)
Glucose, Bld: 200 mg/dL — ABNORMAL HIGH (ref 70–99)
Potassium: 3.2 mmol/L — ABNORMAL LOW (ref 3.5–5.1)
Sodium: 141 mmol/L (ref 135–145)
Total Bilirubin: 0.6 mg/dL (ref 0.0–1.2)
Total Protein: 7 g/dL (ref 6.5–8.1)

## 2023-06-09 LAB — BRAIN NATRIURETIC PEPTIDE: B Natriuretic Peptide: 97 pg/mL (ref 0.0–100.0)

## 2023-06-09 LAB — CBC WITH DIFFERENTIAL/PLATELET
Abs Immature Granulocytes: 0.01 10*3/uL (ref 0.00–0.07)
Basophils Absolute: 0 10*3/uL (ref 0.0–0.1)
Basophils Relative: 0 %
Eosinophils Absolute: 0 10*3/uL (ref 0.0–0.5)
Eosinophils Relative: 0 %
HCT: 41.2 % (ref 36.0–46.0)
Hemoglobin: 13.3 g/dL (ref 12.0–15.0)
Immature Granulocytes: 0 %
Lymphocytes Relative: 28 %
Lymphs Abs: 1.4 10*3/uL (ref 0.7–4.0)
MCH: 33.1 pg (ref 26.0–34.0)
MCHC: 32.3 g/dL (ref 30.0–36.0)
MCV: 102.5 fL — ABNORMAL HIGH (ref 80.0–100.0)
Monocytes Absolute: 0.7 10*3/uL (ref 0.1–1.0)
Monocytes Relative: 15 %
Neutro Abs: 2.8 10*3/uL (ref 1.7–7.7)
Neutrophils Relative %: 57 %
Platelets: 84 10*3/uL — ABNORMAL LOW (ref 150–400)
RBC: 4.02 MIL/uL (ref 3.87–5.11)
RDW: 13.8 % (ref 11.5–15.5)
WBC: 5 10*3/uL (ref 4.0–10.5)
nRBC: 0 % (ref 0.0–0.2)

## 2023-06-09 LAB — RESP PANEL BY RT-PCR (RSV, FLU A&B, COVID)  RVPGX2
Influenza A by PCR: NEGATIVE
Influenza B by PCR: NEGATIVE
Resp Syncytial Virus by PCR: POSITIVE — AB
SARS Coronavirus 2 by RT PCR: NEGATIVE

## 2023-06-09 LAB — MRSA NEXT GEN BY PCR, NASAL: MRSA by PCR Next Gen: NOT DETECTED

## 2023-06-09 LAB — GLUCOSE, CAPILLARY: Glucose-Capillary: 336 mg/dL — ABNORMAL HIGH (ref 70–99)

## 2023-06-09 MED ORDER — CYANOCOBALAMIN 1000 MCG/ML IJ KIT: 1000.0000 ug | PACK | INTRAMUSCULAR | Status: DC

## 2023-06-09 MED ORDER — VENLAFAXINE HCL ER 37.5 MG PO CP24: 150.0000 mg | ORAL_CAPSULE | Freq: Every day | ORAL | Status: DC

## 2023-06-09 MED ORDER — LENALIDOMIDE 15 MG PO CAPS: 15.0000 mg | ORAL_CAPSULE | Freq: Every day | ORAL | Status: DC

## 2023-06-09 MED ORDER — GUAIFENESIN ER 600 MG PO TB12
1200.0000 mg | ORAL_TABLET | Freq: Two times a day (BID) | ORAL | Status: DC
  Administered 2023-06-09: 1200 mg via ORAL
  Filled 2023-06-09: qty 2

## 2023-06-09 MED ORDER — ALBUTEROL SULFATE (2.5 MG/3ML) 0.083% IN NEBU
10.0000 mg | INHALATION_SOLUTION | RESPIRATORY_TRACT | Status: AC
  Administered 2023-06-09: 10 mg via RESPIRATORY_TRACT
  Filled 2023-06-09 (×2): qty 12

## 2023-06-09 MED ORDER — FUROSEMIDE 10 MG/ML IJ SOLN
40.0000 mg | Freq: Two times a day (BID) | INTRAMUSCULAR | Status: DC
  Administered 2023-06-09 – 2023-06-13 (×8): 40 mg via INTRAVENOUS
  Filled 2023-06-09 (×8): qty 4

## 2023-06-09 MED ORDER — IPRATROPIUM-ALBUTEROL 0.5-2.5 (3) MG/3ML IN SOLN
3.0000 mL | Freq: Once | RESPIRATORY_TRACT | Status: AC
  Administered 2023-06-09: 3 mL via RESPIRATORY_TRACT
  Filled 2023-06-09: qty 3

## 2023-06-09 MED ORDER — METHYLPREDNISOLONE SODIUM SUCC 125 MG IJ SOLR: 125.0000 mg | Freq: Every day | INTRAMUSCULAR | Status: DC

## 2023-06-09 MED ORDER — METHYLPREDNISOLONE SODIUM SUCC 125 MG IJ SOLR
125.0000 mg | Freq: Once | INTRAMUSCULAR | Status: AC
  Filled 2023-06-09: qty 2

## 2023-06-09 MED ORDER — OXYCODONE-ACETAMINOPHEN 5-325 MG PO TABS
1.0000 | ORAL_TABLET | ORAL | Status: DC | PRN
  Administered 2023-06-11 – 2023-06-12 (×3): 1 via ORAL
  Filled 2023-06-09 (×3): qty 1

## 2023-06-09 MED ORDER — ALBUTEROL SULFATE (2.5 MG/3ML) 0.083% IN NEBU
10.0000 mg | INHALATION_SOLUTION | RESPIRATORY_TRACT | Status: AC
  Administered 2023-06-09: 10 mg via RESPIRATORY_TRACT

## 2023-06-09 MED ORDER — POTASSIUM CHLORIDE CRYS ER 20 MEQ PO TBCR
40.0000 meq | EXTENDED_RELEASE_TABLET | Freq: Every day | ORAL | Status: DC
  Administered 2023-06-10 – 2023-06-12 (×3): 40 meq via ORAL
  Filled 2023-06-09 (×3): qty 2

## 2023-06-09 MED ORDER — FUROSEMIDE 10 MG/ML IJ SOLN
40.0000 mg | INTRAMUSCULAR | Status: AC
  Administered 2023-06-09: 40 mg via INTRAVENOUS
  Filled 2023-06-09: qty 4

## 2023-06-09 MED ORDER — IPRATROPIUM-ALBUTEROL 0.5-2.5 (3) MG/3ML IN SOLN
3.0000 mL | RESPIRATORY_TRACT | Status: DC
  Administered 2023-06-09 – 2023-06-10 (×3): 3 mL via RESPIRATORY_TRACT
  Filled 2023-06-09 (×4): qty 3

## 2023-06-09 MED ORDER — FERROUS SULFATE 325 (65 FE) MG PO TABS
325.0000 mg | ORAL_TABLET | Freq: Every day | ORAL | Status: DC
  Administered 2023-06-11 – 2023-06-13 (×3): 325 mg via ORAL
  Filled 2023-06-09 (×3): qty 1

## 2023-06-09 MED ORDER — MORPHINE SULFATE (PF) 4 MG/ML IV SOLN
4.0000 mg | Freq: Once | INTRAVENOUS | Status: AC
  Administered 2023-06-09: 4 mg via INTRAVENOUS
  Filled 2023-06-09: qty 1

## 2023-06-09 MED ORDER — PRAVASTATIN SODIUM 10 MG PO TABS
10.0000 mg | ORAL_TABLET | Freq: Every day | ORAL | Status: DC
Start: 2023-06-10 — End: 2023-06-10

## 2023-06-09 MED ORDER — POTASSIUM CHLORIDE CRYS ER 20 MEQ PO TBCR
40.0000 meq | EXTENDED_RELEASE_TABLET | Freq: Four times a day (QID) | ORAL | Status: AC
  Administered 2023-06-09 (×2): 40 meq via ORAL
  Filled 2023-06-09 (×2): qty 2

## 2023-06-09 MED ORDER — CHLORHEXIDINE GLUCONATE CLOTH 2 % EX PADS
6.0000 | MEDICATED_PAD | Freq: Every day | CUTANEOUS | Status: DC
  Administered 2023-06-09 – 2023-06-13 (×5): 6 via TOPICAL

## 2023-06-09 MED ORDER — ENOXAPARIN SODIUM 40 MG/0.4ML IJ SOSY: 40.0000 mg | PREFILLED_SYRINGE | INTRAMUSCULAR | Status: DC

## 2023-06-09 MED ORDER — OXYCODONE-ACETAMINOPHEN 10-325 MG PO TABS: 1.0000 | ORAL_TABLET | ORAL | Status: DC | PRN

## 2023-06-09 MED ORDER — ALBUTEROL SULFATE (2.5 MG/3ML) 0.083% IN NEBU
2.5000 mg | INHALATION_SOLUTION | RESPIRATORY_TRACT | Status: DC | PRN
  Administered 2023-06-10 – 2023-06-11 (×2): 2.5 mg via RESPIRATORY_TRACT
  Filled 2023-06-09 (×2): qty 3

## 2023-06-09 MED ORDER — ACETAMINOPHEN 650 MG RE SUPP: 650.0000 mg | Freq: Four times a day (QID) | RECTAL | Status: DC | PRN

## 2023-06-09 MED ORDER — INSULIN ASPART 100 UNIT/ML IJ SOLN
0.0000 [IU] | Freq: Three times a day (TID) | INTRAMUSCULAR | Status: DC
  Administered 2023-06-10: 5 [IU] via SUBCUTANEOUS
  Administered 2023-06-10: 8 [IU] via SUBCUTANEOUS
  Administered 2023-06-10: 3 [IU] via SUBCUTANEOUS
  Administered 2023-06-11: 11 [IU] via SUBCUTANEOUS
  Administered 2023-06-11: 8 [IU] via SUBCUTANEOUS
  Administered 2023-06-11 – 2023-06-12 (×2): 5 [IU] via SUBCUTANEOUS
  Administered 2023-06-12 (×2): 11 [IU] via SUBCUTANEOUS
  Administered 2023-06-13 (×2): 3 [IU] via SUBCUTANEOUS

## 2023-06-09 MED ORDER — PANTOPRAZOLE SODIUM 40 MG PO TBEC
40.0000 mg | DELAYED_RELEASE_TABLET | Freq: Every day | ORAL | Status: DC
  Administered 2023-06-10 – 2023-06-13 (×4): 40 mg via ORAL
  Filled 2023-06-09 (×4): qty 1

## 2023-06-09 MED ORDER — ACETAMINOPHEN 325 MG PO TABS: 650.0000 mg | ORAL_TABLET | Freq: Four times a day (QID) | ORAL | Status: DC | PRN

## 2023-06-09 MED ORDER — CARVEDILOL 3.125 MG PO TABS
6.2500 mg | ORAL_TABLET | Freq: Two times a day (BID) | ORAL | Status: DC
  Administered 2023-06-10 – 2023-06-13 (×7): 6.25 mg via ORAL
  Filled 2023-06-09 (×2): qty 2
  Filled 2023-06-09: qty 1
  Filled 2023-06-09 (×4): qty 2

## 2023-06-09 MED ORDER — HYDRALAZINE HCL 20 MG/ML IJ SOLN: 5.0000 mg | INTRAMUSCULAR | Status: DC | PRN

## 2023-06-09 MED ORDER — ENOXAPARIN SODIUM 80 MG/0.8ML IJ SOSY: 0.5000 mg/kg | PREFILLED_SYRINGE | INTRAMUSCULAR | Status: DC

## 2023-06-09 MED ORDER — INSULIN ASPART 100 UNIT/ML IJ SOLN
0.0000 [IU] | Freq: Every day | INTRAMUSCULAR | Status: DC
  Administered 2023-06-09: 4 [IU] via SUBCUTANEOUS
  Administered 2023-06-10 – 2023-06-12 (×3): 2 [IU] via SUBCUTANEOUS

## 2023-06-09 MED ORDER — METHYLPREDNISOLONE SODIUM SUCC 125 MG IJ SOLR
INTRAMUSCULAR | Status: AC
  Administered 2023-06-09: 125 mg via INTRAVENOUS
  Filled 2023-06-09: qty 2

## 2023-06-09 MED ORDER — OXYCODONE HCL 5 MG PO TABS
5.0000 mg | ORAL_TABLET | ORAL | Status: DC | PRN
  Administered 2023-06-10 – 2023-06-13 (×5): 5 mg via ORAL
  Filled 2023-06-09 (×5): qty 1

## 2023-06-09 MED ORDER — MOMETASONE FURO-FORMOTEROL FUM 100-5 MCG/ACT IN AERO
2.0000 | INHALATION_SPRAY | Freq: Two times a day (BID) | RESPIRATORY_TRACT | Status: DC
  Administered 2023-06-10 – 2023-06-13 (×7): 2 via RESPIRATORY_TRACT
  Filled 2023-06-09: qty 8.8

## 2023-06-09 MED ORDER — ACYCLOVIR 800 MG PO TABS: 400.0000 mg | ORAL_TABLET | Freq: Two times a day (BID) | ORAL | Status: DC

## 2023-06-09 MED ORDER — DOXYCYCLINE HYCLATE 100 MG PO TABS
100.0000 mg | ORAL_TABLET | Freq: Two times a day (BID) | ORAL | Status: DC
  Administered 2023-06-09 – 2023-06-13 (×8): 100 mg via ORAL
  Filled 2023-06-09 (×8): qty 1

## 2023-06-09 NOTE — ED Triage Notes (Signed)
 Pt presents with shob increasing for one month.  Pt reports she has not taken her "fluid pills" x 1 month.  Did take them today and reports not being able to stop urinating.  Pt wears 3L [place] at home continuously. Pt is visibly shob.  O2 sats are stable.  Legs are swollen and red which pt states is baseline.  Hx of multiple myeloma.

## 2023-06-09 NOTE — ED Provider Notes (Signed)
 [place] EMERGENCY DEPARTMENT AT [facility] Provider Note   CSN: [id number] Arrival date & time: 06/09/23  1535     History  Chief Complaint  Patient presents with   Shortness of Breath    [name] is a 73 y.o. female.   Shortness of Breath  Ms. is a 73 year old female, she is morbidly obese in fact she weighs 140 kg and is 5 foot 2, she is on chronic oxygen therapy at about 3 L constantly but this week has had to go up to 6 L because of her severe shortness of breath.  She cannot get out of her chair and walk to the bathroom without severe shortness of breath.  She made a decision about a month ago to stop using her medications because she was tired of living, that means she was not been taking any of her medicines including her torsemide.  Today she states that she found that she is going to be a great grandmother in June so she wants to get some of the fluid off and try to make another goal of doing well.    Home Medications Prior to Admission medications   Medication Sig Start Date End Date Taking? Authorizing Provider  acyclovir (ZOVIRAX) 400 MG tablet Take 1 tablet (400 mg total) by mouth 2 (two) times daily. 11/30/21   Doreatha Massed, MD  albuterol (PROVENTIL) (2.5 MG/3ML) 0.083% nebulizer solution Take 3 mLs (2.5 mg total) by nebulization every 6 (six) hours as needed for wheezing or shortness of breath. 05/15/23   Raliegh Ip, DO  albuterol (VENTOLIN HFA) 108 (90 Base) MCG/ACT inhaler Inhale 2 puffs into the lungs every 6 (six) hours as needed for wheezing or shortness of breath. 05/04/23   Junie Spencer, FNP  baclofen (LIORESAL) 10 MG tablet Take 10 mg by mouth 2 (two) times daily. 03/03/22   [provider]  Blood Glucose Calibration (TRUE METRIX LEVEL 1) Low SOLN Use w/ glucose monitor Dx E11.9 09/01/21   Delynn Flavin M, DO  Blood Glucose Monitoring Suppl (TRUE METRIX AIR GLUCOSE METER) w/Device KIT Check BS daily Dx E11.9  09/01/21   Delynn Flavin M, DO  Cholecalciferol (VITAMIN D) 50 MCG (2000 UT) tablet Take 2,000 Units by mouth daily.    [provider]  clindamycin (CLEOCIN T) 1 % SWAB Apply to affected areas under axilla twice daily 12/02/22   Delynn Flavin M, DO  clobetasol (TEMOVATE) 0.05 % external solution Apply 1 Application topically 2 (two) times daily. 01/24/22   [provider]  Cyanocobalamin (B-12 COMPLIANCE INJECTION) 1000 MCG/ML KIT Inject 1,000 mcg as directed every 30 (thirty) days.    [provider]  desvenlafaxine (PRISTIQ) 100 MG 24 hr tablet Take 100 mg by mouth in the morning. 01/26/23   [provider]  dexamethasone (DECADRON) 4 MG tablet Take 5 tablets once every 4 weeks prior to darzalex 02/09/23   Doreatha Massed, MD  esomeprazole (NEXIUM) 40 MG capsule Take 1 capsule (40 mg total) by mouth daily. 10/03/22   Vassie Loll, MD  ferrous sulfate 325 (65 FE) MG tablet Take 1 tablet (325 mg total) by mouth daily with breakfast. 02/22/22 09/27/22  Doreatha Massed, MD  fluticasone-salmeterol (ADVAIR) 100-50 MCG/ACT AEPB Inhale 1 puff into the lungs 2 (two) times daily. 03/07/23   [provider]  glucose blood (TRUE METRIX BLOOD GLUCOSE TEST) test strip Check BS daily Dx E11.9 09/01/21   Delynn Flavin M, DO  lenalidomide (REVLIMID) 15  MG capsule TAKE 1 CAPSULE BY MOUTH EVERY DAY FOR 21 DAYS ON THEN 7 DAYS OFF 06/06/23   Doreatha Massed, MD  lidocaine-prilocaine (EMLA) cream Apply 1 Application topically as needed. Apply a small amount to port a cath site and cover with plastic wrap 1 hour prior to infusion appointments 04/12/23   Pappayliou, Santina Evans A, DO  lovastatin (MEVACOR) 20 MG tablet Take 1 tablet (20 mg total) by mouth at bedtime. 10/30/19   Raliegh Ip, DO  mupirocin ointment (BACTROBAN) 2 % Apply 1 Application topically 2 (two) times daily. 10/25/22   Blanchard Kelch, NP  nystatin (MYCOSTATIN/NYSTOP) powder Apply  topically 2 (two) times daily. 01/30/23   Dettinger, Elige Radon, MD  oxyCODONE-acetaminophen (PERCOCET) 10-325 MG tablet Take 1 tablet by mouth every 6 (six) hours as needed for pain.    [provider]  OXYGEN Inhale 3 L into the lungs continuous.    [provider]  OZEMPIC, 0.25 OR 0.5 MG/DOSE, 2 MG/3ML SOPN Inject 0.25 mg into the skin every 7 (seven) days. 12/02/22   Raliegh Ip, DO  potassium chloride SA (KLOR-CON) 20 MEQ tablet Take 2 tablets (40 mEq total) by mouth 2 (two) times daily. Patient taking differently: Take 40 mEq by mouth daily. 04/24/20   Raliegh Ip, DO  prochlorperazine (COMPAZINE) 10 MG tablet Take 1 tablet (10 mg total) by mouth every 6 (six) hours as needed for nausea or vomiting. 12/14/21   Doreatha Massed, MD  torsemide (DEMADEX) 20 MG tablet Take 2.5 tablets (50 mg total) by mouth 2 (two) times daily. 10/03/22 01/01/23  Vassie Loll, MD  TRUEplus Lancets 33G MISC Check BS daily Dx E11.9 09/01/21   Raliegh Ip, DO  venlafaxine XR (EFFEXOR-XR) 150 MG 24 hr capsule Take 1 capsule (150 mg total) by mouth daily with breakfast. 10/04/22   Vassie Loll, MD      Allergies    Keflex [cephalexin]    Review of Systems   Review of Systems  Respiratory:  Positive for shortness of breath.   All other systems reviewed and are negative.   Physical Exam Updated Vital Signs BP 136/86   Pulse (!) 111   Resp (!) 22   SpO2 95%  Physical Exam Vitals and nursing note reviewed.  Constitutional:      General: She is in acute distress.     Appearance: She is well-developed. She is ill-appearing.  HENT:     Head: Normocephalic and atraumatic.     Mouth/Throat:     Pharynx: No oropharyngeal exudate.  Eyes:     General: No scleral icterus.       Right eye: No discharge.        Left eye: No discharge.     Conjunctiva/sclera: Conjunctivae normal.     Pupils: Pupils are equal, round, and reactive to light.  Neck:     Thyroid: No  thyromegaly.     Vascular: No JVD.  Cardiovascular:     Rate and Rhythm: Regular rhythm. Tachycardia present.     Heart sounds: Normal heart sounds. No murmur heard.    No friction rub. No gallop.  Pulmonary:     Effort: Respiratory distress present.     Breath sounds: Wheezing and rales present.  Abdominal:     General: Bowel sounds are normal. There is no distension.     Palpations: Abdomen is soft. There is no mass.     Tenderness: There is no abdominal tenderness.  Musculoskeletal:  General: No tenderness. Normal range of motion.     Cervical back: Normal range of motion and neck supple.     Right lower leg: Edema present.     Left lower leg: Edema present.  Lymphadenopathy:     Cervical: No cervical adenopathy.  Skin:    General: Skin is warm and dry.     Findings: No erythema or rash.  Neurological:     Mental Status: She is alert.     Coordination: Coordination normal.  Psychiatric:        Behavior: Behavior normal.     ED Results / Procedures / Treatments   Labs (all labs ordered are listed, but only abnormal results are displayed) Labs Reviewed  CBC WITH DIFFERENTIAL/PLATELET - Abnormal; Notable for the following components:      Result Value   MCV 102.5 (*)    Platelets 84 (*)    All other components within normal limits  COMPREHENSIVE METABOLIC PANEL - Abnormal; Notable for the following components:   Potassium 3.2 (*)    Chloride 96 (*)    CO2 33 (*)    Glucose, Bld 200 (*)    Calcium 8.6 (*)    Alkaline Phosphatase 134 (*)    All other components within normal limits  RESP PANEL BY RT-PCR (RSV, FLU A&B, COVID)  [id number]  BRAIN NATRIURETIC PEPTIDE    EKG EKG Interpretation Date/Time:  Friday June 09 2023 15:54:32 EDT Ventricular Rate:  123 PR Interval:  118 QRS Duration:  134 QT Interval:  339 QTC Calculation: 485 R Axis:   80  Text Interpretation: Sinus tachycardia Right bundle branch block Since last tracing rate faster Confirmed by  Lorine Bears ([id number]) on 06/09/2023 5:41:54 PM  Radiology DG Chest Port 1 View Result Date: 06/09/2023 CLINICAL DATA:  Shortness of breath EXAM: PORTABLE CHEST 1 VIEW COMPARISON:  05/09/2023 FINDINGS: Right Port-A-Cath remains in place, unchanged. Heart and mediastinal contours are within normal limits. Aortic atherosclerosis. Diffuse interstitial prominence with reticulonodular opacities throughout the lungs, stable since prior study. No effusions or acute bony abnormality. IMPRESSION: Diffuse reticulonodular interstitial prominence throughout the lungs, similar prior study. This could reflect chronic interstitial lung disease or atypical infection. Electronically Signed   By: Charlett Nose M.D.   On: 06/09/2023 17:31    Procedures .Critical Care  Performed by: Eber Hong, MD Authorized by: Eber Hong, MD   Critical care provider statement:    Critical care time (minutes):  45   Critical care time was exclusive of:  Teaching time and separately billable procedures and treating other patients   Critical care was necessary to treat or prevent imminent or life-threatening deterioration of the following conditions:  Respiratory failure   Critical care was time spent personally by me on the following activities:  Development of treatment plan with patient or surrogate, discussions with consultants, evaluation of patient's response to treatment, examination of patient, obtaining history from patient or surrogate, review of old charts, re-evaluation of patient's condition, pulse oximetry, ordering and review of radiographic studies, ordering and review of laboratory studies and ordering and performing treatments and interventions   I assumed direction of critical care for this patient from another provider in my specialty: no     Care discussed with: admitting provider   Comments:           Medications Ordered in ED Medications  albuterol (PROVENTIL) (2.5 MG/3ML) 0.083% nebulizer solution  10 mg (0 mg Nebulization Stopped 06/09/23 1830)  albuterol (PROVENTIL) (2.5 MG/3ML)  0.083% nebulizer solution 10 mg (has no administration in time range)  potassium chloride SA (KLOR-CON M) CR tablet 40 mEq (has no administration in time range)  furosemide (LASIX) injection 40 mg (40 mg Intravenous Given 06/09/23 1733)  ipratropium-albuterol (DUONEB) 0.5-2.5 (3) MG/3ML nebulizer solution 3 mL (3 mLs Nebulization Given 06/09/23 1739)  methylPREDNISolone sodium succinate (SOLU-MEDROL) 125 mg/2 mL injection 125 mg (125 mg Intravenous Given 06/09/23 1733)    ED Course/ Medical Decision Making/ A&P                                 Medical Decision Making Amount and/or Complexity of Data Reviewed Labs: ordered. Radiology: ordered. ECG/medicine tests: ordered.  Risk Prescription drug management. Decision regarding hospitalization.    This patient presents to the ED for concern of severe shortness of breath, this involves an extensive number of treatment options, and is a complaint that carries with it a high risk of complications and morbidity.  The differential diagnosis includes pulmonary edema, COPD, hypoxic respiratory failure, pneumonia, pneumothorax, she has severe edema of bilateral legs with stasis dermatitis on top of that as well.  They are weeping   Co morbidities that complicate the patient evaluation  No congestive heart failure   Additional history obtained:  Additional history obtained from medical record External records from outside source obtained and reviewed including echocardiogram performed in July 2024 showing that she had an ejection fraction of 65 to 70% with grade 1 diastolic dysfunction   Lab Tests:  I Ordered, and personally interpreted labs.  The pertinent results include: Mild hypokalemia, no leukocytosis, chronically thrombocytopenic   Imaging Studies ordered:  I ordered imaging studies including chest x-ray I independently visualized and interpreted  imaging which showed no obvious pulmonary edema or pneumonia I agree with the radiologist interpretation   Cardiac Monitoring: / EKG:  The patient was maintained on a cardiac monitor.  I personally viewed and interpreted the cardiac monitored which showed an underlying rhythm of: Sinus tachycardia   Consultations Obtained:  I requested consultation with the hospitalist,  and discussed lab and imaging findings as well as pertinent plan - they recommend: Admission to the hospital   Problem List / ED Course / Critical interventions / Medication management  Hypoxic respiratory failure secondary to likely reactive airway disease.  The patient does not have any signs of sepsis at this time other than tachycardia but no source of infection, and has been given diuresis, continuous nebulizers, steroids, will need to be admitted to the hospital to higher level of care  I have reviewed the patients home medicines and have made adjustments as needed   Social Determinants of Health:  Chronic respiratory failure   Test / Admission - Considered:  Admit high level of care         Final Clinical Impression(s) / ED Diagnoses Final diagnoses:  Acute on chronic respiratory failure with hypoxia (HCC)  Anasarca    Rx / DC Orders ED Discharge Orders     None         Eber Hong, MD 06/09/23 1836

## 2023-06-09 NOTE — Progress Notes (Signed)
 PHARMACIST - PHYSICIAN COMMUNICATION  CONCERNING:  Enoxaparin (Lovenox) for DVT Prophylaxis    RECOMMENDATION: Patient was prescribed enoxaprin 40mg  q24 hours for VTE prophylaxis.   Filed Weights   06/09/23 1942 06/09/23 1946  Weight: 135.2 kg (298 lb) 135.2 kg (298 lb)    Body mass index is 54.5 kg/m.  Estimated Creatinine Clearance: 83.2 mL/min (by C-G formula based on SCr of 0.7 mg/dL).   Based on [facility] policy patient is candidate for enoxaparin 0.5mg /kg TBW SQ every 24 hours based on BMI being >30.  DESCRIPTION: Pharmacy has adjusted enoxaparin dose per [facility] policy.  Patient is now receiving enoxaparin 67.5 mg every 24 hours    Merryl Hacker, PharmD Clinical Pharmacist  06/09/2023 7:47 PM

## 2023-06-09 NOTE — ED Notes (Signed)
 This RN made Renz RN aware that MD Elgerawy wanted the pt to use an incentive spirometer and flutter valve. Please to encourage her to use it as well.

## 2023-06-09 NOTE — H&P (Addendum)
 [facility] H&P   Patient Demographics:    Savannah Burns, is a 73 y.o. female  MRN: [id number]   DOB - Apr 09, 1950  Admit Date - 06/09/2023  Outpatient Primary MD for the patient is Raliegh Ip, DO  Referring MD/NP/PA: Dr Hyacinth Meeker  Outpatient Specialists: Oncology Dr. Ellin Saba  Patient coming from: Home  Chief Complaint  Patient presents with   Shortness of Breath      HPI:    Savannah Burns  is a 73 y.o. female,  with the past medical history of NASH cirrhosis, T2DM, hypertension, dyslipidemia, COPD, multiple myeloma, and depression , lives at home with her granddaughter, she comes to ED due to complaints of shortness of breath.  Patient reports she is on 3 L nasal cannula at baseline, patient has not been taking any of her medications recently, she comes to ED given significant dyspnea with minimal activity, reports worsening lower extremity edema as well, reports cough, congestion. -ED she was noted to be with significant wheezing, increased work of breathing, and increased oxygen to 6 L nasal cannula, potassium low at 3.2, glucose elevated at 200, plate count is low at 84, chronic, RSV is positive, [facility] consulted to admit   Review of systems:     A full 10 point Review of Systems was done, except as stated above, all other Review of Systems were negative.   With Past History of the following :    Past Medical History:  Diagnosis Date   Adrenal adenoma, left    Stable   Anxiety    Arthritis    bilateral hands   COPD (chronic obstructive pulmonary disease) (HCC)    Depression    Diabetes mellitus, type 2 (HCC) 08/12/2008   Qualifier: Diagnosis of  By: Dayton Martes MD, Talia     Dyspnea    Esophageal varices (HCC)    Grade II diastolic dysfunction    History of kidney stones    Hyperlipidemia    Hypertension    Lower back pain    Lower GI bleed  03/19/2020   Panic attacks    Pneumonia    currently taking antibiotic and prednisone for early stages of pneumonia   Pulmonary nodules    bilateral   Skin cancer    face      Past Surgical History:  Procedure Laterality Date   BIOPSY  04/07/2020   Procedure: BIOPSY;  Surgeon: Lanelle Bal, DO;  Location: [facility];  Service: Endoscopy;;   Breast Cystectomy  Right    CESAREAN SECTION     COLONOSCOPY WITH PROPOFOL N/A 01/25/2020   Dr. Marletta Lor: Nonbleeding internal hemorrhoids, diverticulosis, 5 mm polyp removed from the ascending colon, 10 mm polyp removed from the sigmoid colon, 30 mm polyp (tubulovillous adenoma with no high-grade dysplasia) removed from the transverse colon via piecemeal status post tattoo.  Other polyps were tubular adenomas.  3  month surveillance colonoscopy recommended.   COLONOSCOPY WITH PROPOFOL N/A 04/07/2020   Procedure: COLONOSCOPY WITH PROPOFOL;  Surgeon: Lanelle Bal, DO;  Location: [facility];  Service: Endoscopy;  Laterality: N/A;  3:00pm, pt knows new time per office   CYSTOSCOPY/URETEROSCOPY/HOLMIUM LASER/STENT PLACEMENT Bilateral 03/01/2019   Procedure: CYSTOSCOPY/RETROGRADEURETEROSCOPY/HOLMIUM LASER/STENT PLACEMENT;  Surgeon: Rene Paci, MD;  Location: [facility];  Service: Urology;  Laterality: Bilateral;  ONLY NEEDS 60 MIN   ESOPHAGOGASTRODUODENOSCOPY (EGD) WITH PROPOFOL N/A 01/25/2020   Dr. Marletta Lor: 4 columns grade 1 esophageal varices   ESOPHAGOGASTRODUODENOSCOPY (EGD) WITH PROPOFOL N/A 05/18/2020   Procedure: ESOPHAGOGASTRODUODENOSCOPY (EGD) WITH PROPOFOL;  Surgeon: Lanelle Bal, DO;  [facility]: [facility];  Service: Endoscopy;  Laterality: N/A;   ESOPHAGOGASTRODUODENOSCOPY (EGD) WITH PROPOFOL N/A 07/28/2020   Procedure: ESOPHAGOGASTRODUODENOSCOPY (EGD) WITH PROPOFOL;  Surgeon: Lanelle Bal, DO;  [facility]: [facility];  Service: Endoscopy;  Laterality: N/A;  am or early PM due to givens capsule placement   GIVENS  CAPSULE STUDY N/A 05/18/2020   Procedure: GIVENS CAPSULE STUDY;  Surgeon: Dolores Frame, MD;  [facility]: [facility];  Service: Gastroenterology;  Laterality: N/A;   GIVENS CAPSULE STUDY N/A 07/28/2020   Procedure: GIVENS CAPSULE STUDY;  Surgeon: Lanelle Bal, DO;  [facility]: [facility];  Service: Endoscopy;  Laterality: N/A;   IR IMAGING GUIDED PORT INSERTION  12/02/2021   IRRIGATION AND DEBRIDEMENT ABSCESS Right 09/28/2022   Procedure: IRRIGATION AND DEBRIDEMENT ABSCESS;  Surgeon: Lewie Chamber, DO;  Location: [facility];  Service: General;  Laterality: Right;   POLYPECTOMY  01/25/2020   Procedure: POLYPECTOMY;  Surgeon: Lanelle Bal, DO;  [facility];  Service: Endoscopy;;   POLYPECTOMY  04/07/2020   Procedure: POLYPECTOMY INTESTINAL;  Surgeon: Lanelle Bal, DO;  [facility]: [facility];  Service: Endoscopy;;   PORT-A-CATH REMOVAL Right 09/28/2022   Procedure: MINOR REMOVAL PORT-A-CATH;  Surgeon: Lewie Chamber, DO;  Location: [facility];  Service: General;  Laterality: Right;   PORTACATH PLACEMENT Right 04/12/2023   Procedure: INSERTION PORT-A-CATH, RIJ;  Surgeon: Lewie Chamber, DO;  Location: [facility];  Service: General;  Laterality: Right;   SKIN CANCER EXCISION     Face   SPINE SURGERY     SUBMUCOSAL TATTOO INJECTION  01/25/2020   Procedure: SUBMUCOSAL TATTOO INJECTION;  Surgeon: Lanelle Bal, DO;  [facility]: [facility];  Service: Endoscopy;;      Social History:     Social History   Tobacco Use   Smoking status: Former    Current packs/day: 0.00    Average packs/day: 1.5 packs/day for 40.0 years (60.0 ttl pk-yrs)    Types: Cigarettes    Start date: 04/29/1975    Quit date: 04/29/2015    Years since quitting: 8.1   Smokeless tobacco: Never   Tobacco comments:    Quit smoking 04/2015- Previous 1.5 ppd smoker  Substance Use Topics   Alcohol use: No    Alcohol/week: 0.0 standard drinks of alcohol        Family  History :     Family History  Problem Relation Age of Onset   Diabetes Father    Heart disease Father 67       MI   Hypertension Father    Anemia Mother        Transfusion dependent   COPD Sister    Cancer Paternal Grandmother 44       Pancreatic      Home  Medications:   Prior to Admission medications   Medication Sig Start Date End Date Taking? Authorizing Provider  azithromycin (ZITHROMAX) 250 MG tablet Take 250 mg by mouth daily. 05/09/23  Yes [provider]  lenalidomide (REVLIMID) 15 MG capsule TAKE 1 CAPSULE BY MOUTH EVERY DAY FOR 21 DAYS ON THEN 7 DAYS OFF 06/06/23  Yes Doreatha Massed, MD  acyclovir (ZOVIRAX) 400 MG tablet Take 1 tablet (400 mg total) by mouth 2 (two) times daily. 11/30/21   Doreatha Massed, MD  albuterol (PROVENTIL) (2.5 MG/3ML) 0.083% nebulizer solution Take 3 mLs (2.5 mg total) by nebulization every 6 (six) hours as needed for wheezing or shortness of breath. 05/15/23   Raliegh Ip, DO  albuterol (VENTOLIN HFA) 108 (90 Base) MCG/ACT inhaler Inhale 2 puffs into the lungs every 6 (six) hours as needed for wheezing or shortness of breath. 05/04/23   Jannifer Rodney A, FNP  Blood Glucose Calibration (TRUE METRIX LEVEL 1) Low SOLN Use w/ glucose monitor Dx E11.9 09/01/21   Delynn Flavin M, DO  Blood Glucose Monitoring Suppl (TRUE METRIX AIR GLUCOSE METER) w/Device KIT Check BS daily Dx E11.9 09/01/21   Delynn Flavin M, DO  clindamycin (CLEOCIN T) 1 % SWAB Apply to affected areas under axilla twice daily 12/02/22   Delynn Flavin M, DO  desvenlafaxine (PRISTIQ) 100 MG 24 hr tablet Take 100 mg by mouth in the morning. 01/26/23   [provider]  fluticasone-salmeterol (ADVAIR) 100-50 MCG/ACT AEPB Inhale 1 puff into the lungs 2 (two) times daily. 03/07/23   [provider]  glucose blood (TRUE METRIX BLOOD GLUCOSE TEST) test strip Check BS daily Dx E11.9 09/01/21   Delynn Flavin M, DO  lidocaine-prilocaine (EMLA) cream  Apply 1 Application topically as needed. Apply a small amount to port a cath site and cover with plastic wrap 1 hour prior to infusion appointments 04/12/23   Pappayliou, Santina Evans A, DO  nystatin (MYCOSTATIN/NYSTOP) powder Apply topically 2 (two) times daily. 01/30/23   Dettinger, Elige Radon, MD  oxyCODONE-acetaminophen (PERCOCET) 10-325 MG tablet Take 1 tablet by mouth every 6 (six) hours as needed for pain.    [provider]  OXYGEN Inhale 3 L into the lungs continuous.    [provider]  OZEMPIC, 0.25 OR 0.5 MG/DOSE, 2 MG/3ML SOPN Inject 0.25 mg into the skin every 7 (seven) days. 12/02/22   Raliegh Ip, DO  prochlorperazine (COMPAZINE) 10 MG tablet Take 1 tablet (10 mg total) by mouth every 6 (six) hours as needed for nausea or vomiting. 12/14/21   Doreatha Massed, MD  TRUEplus Lancets 33G MISC Check BS daily Dx E11.9 09/01/21   Delynn Flavin M, DO  venlafaxine XR (EFFEXOR-XR) 150 MG 24 hr capsule Take 1 capsule (150 mg total) by mouth daily with breakfast. 10/04/22   Vassie Loll, MD     Allergies:     Allergies  Allergen Reactions   Keflex [Cephalexin] Nausea And Vomiting     Physical Exam:   Vitals  Blood pressure 136/86, pulse (!) 111, resp. rate (!) 22, SpO2 95%.   1. General Developed female, with morbid obesity, uncomfortable, tachypneic  2. Normal affect and insight, Not Suicidal or Homicidal, Awake Alert, Oriented X 3.  3. No F.N deficits, ALL C.Nerves Intact, Strength 5/5 all 4 extremities, Sensation intact all 4 extremities, Plantars down going.  4. Ears and Eyes appear Normal, Conjunctivae clear, PERRLA. Moist Oral Mucosa.  5. Supple Neck, No JVD, No cervical lymphadenopathy appriciated, No Carotid Bruits.  6. Symmetrical Chest wall movement, difficult Rales and rhonchi bilaterally, tachypneic  7.  Tachycardic but regular, No Gallops, +Murmurs, No Parasternal Heave.  3 edema  8. Positive Bowel Sounds, Abdomen Soft, No tenderness, No  organomegaly appriciated,No rebound -guarding or rigidity.  9.  No Cyanosis, Normal Skin Turgor, No Skin Rash or Bruise.  10. Good muscle tone,  joints appear normal , no effusions, Normal ROM.    Data Review:    CBC Recent Labs  Lab 06/09/23 1557  WBC 5.0  HGB 13.3  HCT 41.2  PLT 84*  MCV 102.5*  MCH 33.1  MCHC 32.3  RDW 13.8  LYMPHSABS 1.4  MONOABS 0.7  EOSABS 0.0  BASOSABS 0.0   ------------------------------------------------------------------------------------------------------------------  Chemistries  Recent Labs  Lab 06/09/23 1557  NA 141  K 3.2*  CL 96*  CO2 33*  GLUCOSE 200*  BUN 14  CREATININE 0.70  CALCIUM 8.6*  AST 36  ALT 33  ALKPHOS 134*  BILITOT 0.6   ------------------------------------------------------------------------------------------------------------------ CrCl cannot be calculated (Unknown ideal weight.). ------------------------------------------------------------------------------------------------------------------ No results for input(s): "TSH", "T4TOTAL", "T3FREE", "THYROIDAB" in the last 72 hours.  Invalid input(s): "FREET3"  Coagulation profile No results for input(s): "INR", "PROTIME" in the last 168 hours. ------------------------------------------------------------------------------------------------------------------- No results for input(s): "DDIMER" in the last 72 hours. -------------------------------------------------------------------------------------------------------------------  Cardiac Enzymes No results for input(s): "CKMB", "TROPONINI", "MYOGLOBIN" in the last 168 hours.  Invalid input(s): "CK" ------------------------------------------------------------------------------------------------------------------    Component Value Date/Time   BNP 97.0 06/09/2023 1630     ---------------------------------------------------------------------------------------------------------------  Urinalysis     Component Value Date/Time   COLORURINE STRAW (A) 09/26/2022 1800   APPEARANCEUR CLEAR 09/26/2022 1800   APPEARANCEUR Cloudy (A) 10/19/2021 1223   LABSPEC 1.005 09/26/2022 1800   PHURINE 6.0 09/26/2022 1800   GLUCOSEU NEGATIVE 09/26/2022 1800   HGBUR SMALL (A) 09/26/2022 1800   HGBUR negative 07/23/2007 1049   BILIRUBINUR NEGATIVE 09/26/2022 1800   BILIRUBINUR Negative 10/19/2021 1223   KETONESUR NEGATIVE 09/26/2022 1800   PROTEINUR NEGATIVE 09/26/2022 1800   UROBILINOGEN 0.2 04/30/2015 1205   UROBILINOGEN 0.2 07/23/2007 1049   NITRITE NEGATIVE 09/26/2022 1800   LEUKOCYTESUR NEGATIVE 09/26/2022 1800    ----------------------------------------------------------------------------------------------------------------   Imaging Results:    DG Chest Port 1 View Result Date: 06/09/2023 CLINICAL DATA:  Shortness of breath EXAM: PORTABLE CHEST 1 VIEW COMPARISON:  05/09/2023 FINDINGS: Right Port-A-Cath remains in place, unchanged. Heart and mediastinal contours are within normal limits. Aortic atherosclerosis. Diffuse interstitial prominence with reticulonodular opacities throughout the lungs, stable since prior study. No effusions or acute bony abnormality. IMPRESSION: Diffuse reticulonodular interstitial prominence throughout the lungs, similar prior study. This could reflect chronic interstitial lung disease or atypical infection. Electronically Signed   By: Charlett Nose M.D.   On: 06/09/2023 17:31    EKG:  Vent. rate 123 BPM PR interval 118 ms QRS duration 134 ms QT/QTcB 339/485 ms P-R-T axes 60 80 10 Sinus tachycardia Right bundle branch block Since last tracing rate faster   Assessment & Plan:    Principal Problem:   Acute on chronic respiratory failure with hypoxia (HCC) Active Problems:   Chronic respiratory failure with hypoxia (HCC)   Diabetes mellitus, type 2 (HCC)   Class 3 obesity   HYPERTENSION, BENIGN SYSTEMIC   COPD (chronic obstructive pulmonary disease) (HCC)    Back pain   Acute on chronic congestive heart failure (HCC)   Cirrhosis of liver (HCC)  Acute on chronic hypoxic respiratory failure COPD exacerbation Acute on chronic diastolic CHF RSV infection -Presents  with worsening dyspnea, increased work of breathing, at baseline 3 L oxygen, currently on 6 L. -Difficult wheezing on presentation, will treat for COPD exacerbation, continue with IV Solu-Medrol, scheduled DuoNebs and as needed albuterol. -Cough, with a productive phlegm, will start on doxycycline -He was encouraged to use incentive spirometry and flutter valve. -Despite normal BNP, evidence on imaging and physical exam suggestive of volume overload, she has not been taking/noncompliant with her torsemide for few weeks now -Start on IV Lasix 40 mg IV twice daily  Multiple myeloma -Follows with Dr. Ellin Saba, continue with Revlimid, -Continue with acyclovir for infection prophylaxis  Essential hypertension  -Has been noncompliant with her medications, her blood pressure is stable, will keep on as needed hydralazine  Hyperlipidemia -Resume home statin when stable  Cirrhosis: -Resume on diuresis, please see above discussion -resum especially she is tachycardic  Thrombocytopenia -Chronic, at baseline, due to cirrhosis, platelet at baseline, SCD for DVT prophylaxis  Morbid obesity - Body mass index is 54.5 kg/m.  Diabetes mellitus, type 2 (HCC) -She is on Ozempic, will hold during hospital stay, will check A1c, I do expect her BG to be elevated given she is on IV steroids, will keep on moderate insulin sliding scale.  Hypokalemia -Replaced  Noncompliance with medication -She was counseled   DVT Prophylaxis SCD  AM Labs Ordered, also please review Full Orders  Family Communication: Admission, patients condition and plan of care including tests being ordered have been discussed with the patient and granddaughter at bedside* who indicate understanding and agree with the  plan and Code Status.  Code Status DNR, she does not wish for any CPR, intubation or life extended measures if she is having poor life quality ,she request DNR form to be provided at time of discharge as she lost them when she has at home  Likely DC to home  Consults called: None  Admission status: Inpatient  Time spent in minutes : 70 minutes   Huey Bienenstock M.D on 06/09/2023 at 7:44 PM   [facility] - Office  [phone]

## 2023-06-09 NOTE — ED Notes (Signed)
 Stage 1, blanchable, wound to the sacral area.

## 2023-06-09 NOTE — Progress Notes (Signed)
   06/09/23 2134  TOC Assessment  TOC screening is complete Yes  Once discharged, how will the patient get to their discharge location? Family/Friend - Photographer (granddaughter)  Expected Discharge Plan Home w Home Health Services  Barriers to Discharge Continued Medical Work up  Patient states their goals for this hospitalization and ongoing recovery are: "Get this fluid out and go home"  Living arrangements for the past 2 months Single Family Home  Lives with: Relatives (granddaughter)  Current home services Other (comment) ([employer] ([facility]))  DME Agency [facility]  Patient language and need for interpreter reviewed: Yes  Criminal Activity/Legal Involvement Pertinent to Current Situation/Hospitalization No - Comment as needed  Need for Family Participation in Patient Care Y  Care giver support system in place? Y  Attitude/Demeanor/Rapport Engaged  Affect (typically observed) Appropriate  Orientation:  Oriented to Self;Oriented to Place;Oriented to  Time;Oriented to Situation  Psych Involvement N   Pt c/decreased mobility, she states r/t her weight and weakness from recent viral illness. Pt is from home c/O2, walker and shower chair. Transition of Care Department [facility]) will continue to monitor for needs.

## 2023-06-10 DIAGNOSIS — J9621 Acute and chronic respiratory failure with hypoxia: Secondary | ICD-10-CM

## 2023-06-10 LAB — CBC
HCT: 37.4 % (ref 36.0–46.0)
Hemoglobin: 12.2 g/dL (ref 12.0–15.0)
MCH: 33.3 pg (ref 26.0–34.0)
MCHC: 32.6 g/dL (ref 30.0–36.0)
MCV: 102.2 fL — ABNORMAL HIGH (ref 80.0–100.0)
Platelets: 85 10*3/uL — ABNORMAL LOW (ref 150–400)
RBC: 3.66 MIL/uL — ABNORMAL LOW (ref 3.87–5.11)
RDW: 14 % (ref 11.5–15.5)
WBC: 4.1 10*3/uL (ref 4.0–10.5)
nRBC: 0 % (ref 0.0–0.2)

## 2023-06-10 LAB — BASIC METABOLIC PANEL
Anion gap: 13 (ref 5–15)
BUN: 18 mg/dL (ref 8–23)
CO2: 31 mmol/L (ref 22–32)
Calcium: 8.5 mg/dL — ABNORMAL LOW (ref 8.9–10.3)
Chloride: 93 mmol/L — ABNORMAL LOW (ref 98–111)
Creatinine, Ser: 0.72 mg/dL (ref 0.44–1.00)
GFR, Estimated: >60 mL/min (ref >60)
Glucose, Bld: 320 mg/dL — ABNORMAL HIGH (ref 70–99)
Potassium: 3.7 mmol/L (ref 3.5–5.1)
Sodium: 137 mmol/L (ref 135–145)

## 2023-06-10 LAB — GLUCOSE, CAPILLARY
Glucose-Capillary: 287 mg/dL — ABNORMAL HIGH (ref 70–99)
Glucose-Capillary: 159 mg/dL — ABNORMAL HIGH (ref 70–99)
Glucose-Capillary: 219 mg/dL — ABNORMAL HIGH (ref 70–99)
Glucose-Capillary: 234 mg/dL — ABNORMAL HIGH (ref 70–99)

## 2023-06-10 MED ORDER — PRAVASTATIN SODIUM 10 MG PO TABS
10.0000 mg | ORAL_TABLET | Freq: Every day | ORAL | Status: DC
  Administered 2023-06-11 – 2023-06-12 (×2): 10 mg via ORAL
  Filled 2023-06-10 (×2): qty 1

## 2023-06-10 MED ORDER — GUAIFENESIN-DM 100-10 MG/5ML PO SYRP
5.0000 mL | ORAL_SOLUTION | ORAL | Status: DC | PRN
  Administered 2023-06-10: 5 mL via ORAL
  Filled 2023-06-10: qty 5

## 2023-06-10 MED ORDER — IPRATROPIUM BROMIDE 0.02 % IN SOLN
RESPIRATORY_TRACT | Status: AC
  Administered 2023-06-10: 0.5 mg
  Filled 2023-06-10: qty 2.5

## 2023-06-10 MED ORDER — METHYLPREDNISOLONE SODIUM SUCC 125 MG IJ SOLR
80.0000 mg | Freq: Two times a day (BID) | INTRAMUSCULAR | Status: DC
  Administered 2023-06-10 – 2023-06-11 (×3): 80 mg via INTRAVENOUS
  Filled 2023-06-10 (×3): qty 2

## 2023-06-10 MED ORDER — GUAIFENESIN-DM 100-10 MG/5ML PO SYRP
15.0000 mL | ORAL_SOLUTION | Freq: Three times a day (TID) | ORAL | Status: DC
  Administered 2023-06-10 – 2023-06-11 (×4): 15 mL via ORAL
  Filled 2023-06-10 (×4): qty 15

## 2023-06-10 MED ORDER — INSULIN GLARGINE-YFGN 100 UNIT/ML ~~LOC~~ SOLN
15.0000 [IU] | SUBCUTANEOUS | Status: DC
  Administered 2023-06-10 – 2023-06-13 (×4): 15 [IU] via SUBCUTANEOUS
  Filled 2023-06-10 (×5): qty 0.15

## 2023-06-10 NOTE — Hospital Course (Addendum)
 Savannah Burns  is a 73 y.o. female,  with the past medical history of NASH cirrhosis, T2DM, hypertension, dyslipidemia, COPD, multiple myeloma, and depression , lives at home with her granddaughter, she comes to ED due to complaints of shortness of breath.  Patient reports she is on 3 L nasal cannula at baseline, patient has not been taking any of her medications recently, she comes to ED given significant dyspnea with minimal activity, reports worsening lower extremity edema as well, reports cough, congestion. ED;  she was noted to be with significant wheezing, increased work of breathing, and increased oxygen to 6 L nasal cannula, potassium low at 3.2, glucose elevated at 200, plate count is low at 84, chronic, RSV is positive.  Admitted for acute on chronic respiratory failure due to RSV infection.      Assessment & Plan:     Principal Problem:   Acute on chronic respiratory failure with hypoxia (HCC) Active Problems:   Chronic respiratory failure with hypoxia (HCC)   Diabetes mellitus, type 2 (HCC)   Class 3 obesity   HYPERTENSION, BENIGN SYSTEMIC   COPD (chronic obstructive pulmonary disease) (HCC)   Back pain   Acute on chronic congestive heart failure (HCC)   Cirrhosis of liver (HCC)   Acute on chronic hypoxic respiratory failure/ COPD exacerbation Acute on chronic diastolic CHF/  RSV infection -Still complaining shortness of breath, cough this morning,  -Down from 6 L to 4 L of oxygen this morning, satting 97%  -POA: Dyspnea, increased work of breathing, at baseline 3 L oxygen, up to 6 L  now  -Difficult wheezing on presentation, will treat for COPD exacerbation, continue with IV Solu-Medrol, scheduled DuoNebs and as needed albuterol. -Cough, with a productive phlegm, On Doxycycline -He was encouraged to use incentive spirometry and flutter valve. -Despite normal BNP, evidence on imaging and physical exam suggestive of volume overload, she has not been taking/noncompliant  with her torsemide for few weeks now -Continue IV Lasix 40 mg IV twice daily   Multiple myeloma - stable  -Follows with Dr. Ellin Saba, continue with Revlimid, -Continue with acyclovir for infection prophylaxis   Essential hypertension  -Has been noncompliant with her medications,  - Blood pressure is stable,  - Continue as needed hydralazine   Hyperlipidemia -Resume home statin    Cirrhosis: -Resume on diuresis, -Stable    Thrombocytopenia -Chronic, at baseline, due to cirrhosis, platelet at baseline,  - SCD for DVT prophylaxis   Morbid obesity - Body mass index is 54.5 kg/m.   Diabetes mellitus, type 2 (HCC) -She is on Ozempic, will hold during hospital stay, - A1c,  -Expecting hyperglycemia on IV steroids-checking CBG q. ACHS, with moderate SSI coverage    Hypokalemia -Replaced   Noncompliance with medication -She was counseled

## 2023-06-10 NOTE — Progress Notes (Signed)
 PROGRESS NOTE    Patient: Savannah Burns                            PCP: Raliegh Ip, DO                    DOB: 02-04-1951            DOA: 06/09/2023 [id number]             DOS: 06/10/2023, 10:39 AM   LOS: 1 day   Date of Service: The patient was seen and examined on 06/10/2023  Subjective:   The patient was seen and examined this morning. Hemodynamically stable. No issues overnight .  Brief Narrative:   Savannah Burns  is a 73 y.o. female,  with the past medical history of NASH cirrhosis, T2DM, hypertension, dyslipidemia, COPD, multiple myeloma, and depression , lives at home with her granddaughter, she comes to ED due to complaints of shortness of breath.  Patient reports she is on 3 L nasal cannula at baseline, patient has not been taking any of her medications recently, she comes to ED given significant dyspnea with minimal activity, reports worsening lower extremity edema as well, reports cough, congestion. ED;  she was noted to be with significant wheezing, increased work of breathing, and increased oxygen to 6 L nasal cannula, potassium low at 3.2, glucose elevated at 200, plate count is low at 84, chronic, RSV is positive.  Admitted for acute on chronic respiratory failure due to RSV infection.      Assessment & Plan:     Principal Problem:   Acute on chronic respiratory failure with hypoxia (HCC) Active Problems:   Chronic respiratory failure with hypoxia (HCC)   Diabetes mellitus, type 2 (HCC)   Class 3 obesity   HYPERTENSION, BENIGN SYSTEMIC   COPD (chronic obstructive pulmonary disease) (HCC)   Back pain   Acute on chronic congestive heart failure (HCC)   Cirrhosis of liver (HCC)   Acute on chronic hypoxic respiratory failure/ COPD exacerbation Acute on chronic diastolic CHF/  RSV infection -Still complaining shortness of breath, cough this morning,  -Down from 6 L to 4 L of oxygen this morning, satting 97%  -POA: Dyspnea, increased work of  breathing, at baseline 3 L oxygen, up to 6 L  now  -Difficult wheezing on presentation, will treat for COPD exacerbation, continue with IV Solu-Medrol, scheduled DuoNebs and as needed albuterol. -Cough, with a productive phlegm, On Doxycycline -He was encouraged to use incentive spirometry and flutter valve. -Despite normal BNP, evidence on imaging and physical exam suggestive of volume overload, she has not been taking/noncompliant with her torsemide for few weeks now -Continue IV Lasix 40 mg IV twice daily   Multiple myeloma - stable  -Follows with Dr. Ellin Saba, continue with Revlimid, -Continue with acyclovir for infection prophylaxis   Essential hypertension  -Has been noncompliant with her medications,  - Blood pressure is stable,  - Continue as needed hydralazine   Hyperlipidemia -Resume home statin    Cirrhosis: -Resume on diuresis, -Stable    Thrombocytopenia -Chronic, at baseline, due to cirrhosis, platelet at baseline,  - SCD for DVT prophylaxis   Morbid obesity - Body mass index is 54.5 kg/m.   Diabetes mellitus, type 2 (HCC) -She is on Ozempic, will hold during hospital stay, - A1c,  -Expecting hyperglycemia on IV steroids-checking CBG q. ACHS, with moderate SSI coverage    Hypokalemia -Replaced  Noncompliance with medication -She was counseled    ------------------------------------------------------------------------------------------------------------------------------------------  DVT prophylaxis:  Place and maintain sequential compression device Start: 06/09/23 2036   Code Status:   Code Status: Limited: Do not attempt resuscitation (DNR) -DNR-LIMITED -Do Not Intubate/DNI   Family Communication: No family member present at bedside- attempt will be made to update daily The above findings and plan of care has been discussed with patient (and family)  in detail,  they expressed understanding and agreement of above. -Advance care planning has  been discussed.   Admission status:   Status is: Inpatient Remains inpatient appropriate because: Needing IV diuretics, higher supplemental oxygen than baseline, breathing treatments   Disposition: From  - home             Planning for discharge in 2 days: to home with home health  Procedures:   No admission procedures for hospital encounter.   Antimicrobials:  Anti-infectives (From admission, onward)    Start     Dose/Rate Route Frequency Ordered Stop   06/09/23 2200  acyclovir (ZOVIRAX) tablet 400 mg  Status:  Discontinued        400 mg Oral 2 times daily 06/09/23 1942 06/10/23 0751   06/09/23 1900  doxycycline (VIBRA-TABS) tablet 100 mg        100 mg Oral Every 12 hours 06/09/23 1853          Medication:   carvedilol  6.25 mg Oral BID WC   Chlorhexidine Gluconate Cloth  6 each Topical Daily   doxycycline  100 mg Oral Q12H   [START ON 06/11/2023] ferrous sulfate  325 mg Oral Q breakfast   furosemide  40 mg Intravenous BID   guaiFENesin-dextromethorphan  15 mL Oral Q8H   insulin aspart  0-15 Units Subcutaneous TID WC   insulin aspart  0-5 Units Subcutaneous QHS   insulin glargine-yfgn  15 Units Subcutaneous Q24H   lenalidomide  15 mg Oral Daily   methylPREDNISolone (SOLU-MEDROL) injection  80 mg Intravenous Q12H   mometasone-formoterol  2 puff Inhalation BID   pantoprazole  40 mg Oral Daily   potassium chloride SA  40 mEq Oral Daily   [START ON 06/11/2023] pravastatin  10 mg Oral q1800   venlafaxine XR  150 mg Oral Q breakfast    acetaminophen **OR** acetaminophen, albuterol, guaiFENesin-dextromethorphan, hydrALAZINE, oxyCODONE-acetaminophen **AND** oxyCODONE   Objective:   Vitals:   06/10/23 0600 06/10/23 0700 06/10/23 0751 06/10/23 0800  BP: (!) 146/72 121/67  137/62  Pulse: 78 75 86 81  Resp: 17 16 19 18   Temp:   97.7 F (36.5 C)   TempSrc:   Axillary   SpO2: 100% 98% 100% 97%  Weight:      Height:        Intake/Output Summary (Last 24 hours) at  06/10/2023 1039 Last data filed at 06/10/2023 0810 Gross per 24 hour  Intake 240 ml  Output 2400 ml  Net -2160 ml   Filed Weights   06/09/23 1946 06/09/23 2030 06/10/23 0300  Weight: 135.2 kg 128.5 kg 127.9 kg     Physical examination:   Constitution:  Alert, cooperative, no distress,  Appears calm and comfortable  Psychiatric:   Normal and stable mood and affect, cognition intact,   HEENT:        Normocephalic, PERRL, otherwise with in Normal limits  Chest:         Chest symmetric Cardio vascular:  S1/S2, RRR, No murmure, No Rubs or Gallops  pulmonary: Clear to auscultation bilaterally,  respirations unlabored, negative wheezes / crackles Abdomen: Soft, non-tender, non-distended, bowel sounds,no masses, no organomegaly Muscular skeletal: Limited exam - in bed, able to move all 4 extremities,   Neuro: CNII-XII intact. , normal motor and sensation, reflexes intact  Extremities: +2 pitting edema lower extremities, +2 pulses  Skin: Dry, warm to touch, negative for any Rashes, No open wounds Wounds: per nursing documentation   ------------------------------------------------------------------------------------------------------------------------------------------    LABs:     Latest Ref Rng & Units 06/10/2023    4:41 AM 06/09/2023    3:57 PM 05/25/2023    8:10 AM  CBC  WBC 4.0 - 10.5 K/uL 4.1  5.0  2.3   Hemoglobin 12.0 - 15.0 g/dL 28.3  15.1  76.1   Hematocrit 36.0 - 46.0 % 37.4  41.2  35.0   Platelets 150 - 400 K/uL 85  84  68       Latest Ref Rng & Units 06/10/2023    4:41 AM 06/09/2023    3:57 PM 05/25/2023    8:10 AM  CMP  Glucose 70 - 99 mg/dL 607  371  062   BUN 8 - 23 mg/dL 18  14  9    Creatinine 0.44 - 1.00 mg/dL 6.94  8.54  6.27   Sodium 135 - 145 mmol/L 137  141  138   Potassium 3.5 - 5.1 mmol/L 3.7  3.2  3.6   Chloride 98 - 111 mmol/L 93  96  100   CO2 22 - 32 mmol/L 31  33  30   Calcium 8.9 - 10.3 mg/dL 8.5  8.6  8.5   Total Protein 6.5 - 8.1 g/dL  7.0  5.5    Total Bilirubin 0.0 - 1.2 mg/dL  0.6  0.6   Alkaline Phos 38 - 126 U/L  134  121   AST 15 - 41 U/L  36  20   ALT 0 - 44 U/L  33  17        Micro Results Recent Results (from the past 240 hours)  Resp panel by RT-PCR (RSV, Flu A&B, Covid) Anterior Nasal Swab     Status: Abnormal   Collection Time: 06/09/23  6:34 PM   Specimen: Anterior Nasal Swab  Result Value Ref Range Status   SARS Coronavirus 2 by RT PCR NEGATIVE NEGATIVE Final    Comment: (NOTE) SARS-CoV-2 target nucleic acids are NOT DETECTED.  The SARS-CoV-2 RNA is generally detectable in upper respiratory specimens during the acute phase of infection. The lowest concentration of SARS-CoV-2 viral copies this assay can detect is 138 copies/mL. A negative result does not preclude SARS-Cov-2 infection and should not be used as the sole basis for treatment or other patient management decisions. A negative result may occur with  improper specimen collection/handling, submission of specimen other than nasopharyngeal swab, presence of viral mutation(s) within the areas targeted by this assay, and inadequate number of viral copies(<138 copies/mL). A negative result must be combined with clinical observations, patient history, and epidemiological information. The expected result is Negative.  Fact Sheet for Patients:  BloggerCourse.com  Fact Sheet for Healthcare Providers:  SeriousBroker.it  This test is no t yet approved or cleared by the [place] FDA and  has been authorized for detection and/or diagnosis of SARS-CoV-2 by FDA under an Emergency Use Authorization (EUA). This EUA will remain  in effect (meaning this test can be used) for the duration of the COVID-19 declaration under Section 564(b)(1) of the Act, 21 U.S.C.section 360bbb-3(b)(1), unless the  authorization is terminated  or revoked sooner.       Influenza A by PCR NEGATIVE NEGATIVE Final   Influenza B  by PCR NEGATIVE NEGATIVE Final    Comment: (NOTE) The Xpert Xpress SARS-CoV-2/FLU/RSV plus assay is intended as an aid in the diagnosis of influenza from Nasopharyngeal swab specimens and should not be used as a sole basis for treatment. Nasal washings and aspirates are unacceptable for Xpert Xpress SARS-CoV-2/FLU/RSV testing.  Fact Sheet for Patients: BloggerCourse.com  Fact Sheet for Healthcare Providers: SeriousBroker.it  This test is not yet approved or cleared by the [place] FDA and has been authorized for detection and/or diagnosis of SARS-CoV-2 by FDA under an Emergency Use Authorization (EUA). This EUA will remain in effect (meaning this test can be used) for the duration of the COVID-19 declaration under Section 564(b)(1) of the Act, 21 U.S.C. section 360bbb-3(b)(1), unless the authorization is terminated or revoked.     Resp Syncytial Virus by PCR POSITIVE (A) NEGATIVE Final    Comment: (NOTE) Fact Sheet for Patients: BloggerCourse.com  Fact Sheet for Healthcare Providers: SeriousBroker.it  This test is not yet approved or cleared by the [place] FDA and has been authorized for detection and/or diagnosis of SARS-CoV-2 by FDA under an Emergency Use Authorization (EUA). This EUA will remain in effect (meaning this test can be used) for the duration of the COVID-19 declaration under Section 564(b)(1) of the Act, 21 U.S.C. section 360bbb-3(b)(1), unless the authorization is terminated or revoked.  Performed at [facility], [address] [place], [place] [address]   MRSA Next Gen by PCR, Nasal     Status: None   Collection Time: 06/09/23  8:21 PM   Specimen: Nasal Mucosa; Nasal Swab  Result Value Ref Range Status   MRSA by PCR Next Gen NOT DETECTED NOT DETECTED Final    Comment: (NOTE) The GeneXpert MRSA Assay (FDA approved for NASAL specimens only), is  one component of a comprehensive MRSA colonization surveillance program. It is not intended to diagnose MRSA infection nor to guide or monitor treatment for MRSA infections. Test performance is not FDA approved in patients less than 76 years old. Performed at [facility], [address], [place], [place] [address]     Radiology Reports DG Chest [place] 1 View Result Date: 06/09/2023 CLINICAL DATA:  Shortness of breath EXAM: PORTABLE CHEST 1 VIEW COMPARISON:  05/09/2023 FINDINGS: Right Port-A-Cath remains in place, unchanged. Heart and mediastinal contours are within normal limits. Aortic atherosclerosis. Diffuse interstitial prominence with reticulonodular opacities throughout the lungs, stable since prior study. No effusions or acute bony abnormality. IMPRESSION: Diffuse reticulonodular interstitial prominence throughout the lungs, similar prior study. This could reflect chronic interstitial lung disease or atypical infection. Electronically Signed   By: Charlett Nose M.D.   On: 06/09/2023 17:31    SIGNED: Kendell Bane, MD, FHM. FAAFP. [facility] - [facility] Critical care time spent - 55 min.  In seeing, evaluating and examining the patient. Reviewing medical records, labs, drawn plan of care. [facility],  Pager (please use [email or web address] to page/ text) Please use Epic Secure Chat for non-urgent communication (7AM-7PM)  If 7PM-7AM, please contact night-coverage [email or web address], 06/10/2023, 10:39 AM

## 2023-06-10 NOTE — Progress Notes (Signed)
   06/10/23 1700  ReDS Vest / Clip  Station Marker B  Ruler Value 47  ReDS Actual Value  (low quality x3)  Anatomical Comments  (pt sitting upright)

## 2023-06-10 NOTE — Plan of Care (Signed)
  Problem: Education: Goal: Ability to describe self-care measures that may prevent or decrease complications (Diabetes Survival Skills Education) will improve Outcome: Progressing Goal: Individualized Educational Video(s) Outcome: Progressing   Problem: Coping: Goal: Ability to adjust to condition or change in health will improve Outcome: Progressing   Problem: Fluid Volume: Goal: Ability to maintain a balanced intake and output will improve Outcome: Not Progressing   Problem: Health Behavior/Discharge Planning: Goal: Ability to identify and utilize available resources and services will improve Outcome: Progressing Goal: Ability to manage health-related needs will improve Outcome: Progressing   Problem: Metabolic: Goal: Ability to maintain appropriate glucose levels will improve Outcome: Progressing   Problem: Nutritional: Goal: Maintenance of adequate nutrition will improve Outcome: Progressing Goal: Progress toward achieving an optimal weight will improve Outcome: Not Progressing   Problem: Skin Integrity: Goal: Risk for impaired skin integrity will decrease Outcome: Progressing   Problem: Tissue Perfusion: Goal: Adequacy of tissue perfusion will improve Outcome: Progressing   Problem: Education: Goal: Knowledge of General Education information will improve Description: Including pain rating scale, medication(s)/side effects and non-pharmacologic comfort measures Outcome: Progressing   Problem: Health Behavior/Discharge Planning: Goal: Ability to manage health-related needs will improve Outcome: Progressing   Problem: Clinical Measurements: Goal: Ability to maintain clinical measurements within normal limits will improve Outcome: Progressing Goal: Will remain free from infection Outcome: Progressing Goal: Diagnostic test results will improve Outcome: Progressing Goal: Respiratory complications will improve Outcome: Not Progressing Goal: Cardiovascular  complication will be avoided Outcome: Progressing   Problem: Activity: Goal: Risk for activity intolerance will decrease Outcome: Not Progressing   Problem: Nutrition: Goal: Adequate nutrition will be maintained Outcome: Progressing   Problem: Coping: Goal: Level of anxiety will decrease Outcome: Progressing   Problem: Elimination: Goal: Will not experience complications related to bowel motility Outcome: Progressing Goal: Will not experience complications related to urinary retention Outcome: Progressing   Problem: Pain Managment: Goal: General experience of comfort will improve and/or be controlled Outcome: Progressing   Problem: Safety: Goal: Ability to remain free from injury will improve Outcome: Progressing   Problem: Skin Integrity: Goal: Risk for impaired skin integrity will decrease Outcome: Progressing   Problem: Education: Goal: Ability to demonstrate management of disease process will improve Outcome: Progressing Goal: Ability to verbalize understanding of medication therapies will improve Outcome: Progressing Goal: Individualized Educational Video(s) Outcome: Progressing   Problem: Cardiac: Goal: Ability to achieve and maintain adequate cardiopulmonary perfusion will improve Outcome: Progressing   Problem: Education: Goal: Knowledge of disease or condition will improve Outcome: Progressing Goal: Knowledge of the prescribed therapeutic regimen will improve Outcome: Progressing Goal: Individualized Educational Video(s) Outcome: Progressing   Problem: Activity: Goal: Ability to tolerate increased activity will improve Outcome: Progressing Goal: Will verbalize the importance of balancing activity with adequate rest periods Outcome: Progressing   Problem: Respiratory: Goal: Ability to maintain a clear airway will improve Outcome: Progressing Goal: Levels of oxygenation will improve Outcome: Progressing Goal: Ability to maintain adequate  ventilation will improve Outcome: Progressing

## 2023-06-10 NOTE — Progress Notes (Signed)
 Homero Fellers in pharmacy waiting 2 of pt's medications on MAR. (Not verified at this time)

## 2023-06-10 NOTE — Plan of Care (Signed)
  Problem: Education: Goal: Ability to describe self-care measures that may prevent or decrease complications (Diabetes Survival Skills Education) will improve Outcome: Progressing Goal: Individualized Educational Video(s) Outcome: Progressing   Problem: Coping: Goal: Ability to adjust to condition or change in health will improve Outcome: Progressing   Problem: Health Behavior/Discharge Planning: Goal: Ability to identify and utilize available resources and services will improve Outcome: Progressing   Problem: Metabolic: Goal: Ability to maintain appropriate glucose levels will improve Outcome: Progressing

## 2023-06-11 DIAGNOSIS — J9621 Acute and chronic respiratory failure with hypoxia: Secondary | ICD-10-CM | POA: Diagnosis not present

## 2023-06-11 LAB — CBC
HCT: 37.3 % (ref 36.0–46.0)
Hemoglobin: 12.1 g/dL (ref 12.0–15.0)
MCH: 32.9 pg (ref 26.0–34.0)
MCHC: 32.4 g/dL (ref 30.0–36.0)
MCV: 101.4 fL — ABNORMAL HIGH (ref 80.0–100.0)
Platelets: 87 10*3/uL — ABNORMAL LOW (ref 150–400)
RBC: 3.68 MIL/uL — ABNORMAL LOW (ref 3.87–5.11)
RDW: 13.6 % (ref 11.5–15.5)
WBC: 7 10*3/uL (ref 4.0–10.5)
nRBC: 0 % (ref 0.0–0.2)

## 2023-06-11 LAB — BASIC METABOLIC PANEL
Anion gap: 7 (ref 5–15)
BUN: 26 mg/dL — ABNORMAL HIGH (ref 8–23)
CO2: 33 mmol/L — ABNORMAL HIGH (ref 22–32)
Calcium: 8.8 mg/dL — ABNORMAL LOW (ref 8.9–10.3)
Chloride: 95 mmol/L — ABNORMAL LOW (ref 98–111)
Creatinine, Ser: 0.7 mg/dL (ref 0.44–1.00)
GFR, Estimated: >60 mL/min (ref >60)
Glucose, Bld: 285 mg/dL — ABNORMAL HIGH (ref 70–99)
Potassium: 4.1 mmol/L (ref 3.5–5.1)
Sodium: 135 mmol/L (ref 135–145)

## 2023-06-11 LAB — GLUCOSE, CAPILLARY
Glucose-Capillary: 295 mg/dL — ABNORMAL HIGH (ref 70–99)
Glucose-Capillary: 294 mg/dL — ABNORMAL HIGH (ref 70–99)
Glucose-Capillary: 348 mg/dL — ABNORMAL HIGH (ref 70–99)
Glucose-Capillary: 232 mg/dL — ABNORMAL HIGH (ref 70–99)
Glucose-Capillary: 239 mg/dL — ABNORMAL HIGH (ref 70–99)

## 2023-06-11 LAB — BRAIN NATRIURETIC PEPTIDE: B Natriuretic Peptide: 67 pg/mL (ref 0.0–100.0)

## 2023-06-11 MED ORDER — METHYLPREDNISOLONE SODIUM SUCC 40 MG IJ SOLR
40.0000 mg | Freq: Two times a day (BID) | INTRAMUSCULAR | Status: DC
  Administered 2023-06-11: 40 mg via INTRAVENOUS
  Filled 2023-06-11: qty 1

## 2023-06-11 MED ORDER — POLYETHYLENE GLYCOL 3350 17 G PO PACK
17.0000 g | PACK | Freq: Every day | ORAL | Status: DC
  Administered 2023-06-11 – 2023-06-13 (×3): 17 g via ORAL
  Filled 2023-06-11 (×3): qty 1

## 2023-06-11 MED ORDER — GUAIFENESIN-DM 100-10 MG/5ML PO SYRP
10.0000 mL | ORAL_SOLUTION | Freq: Three times a day (TID) | ORAL | Status: DC
  Administered 2023-06-11 – 2023-06-13 (×6): 10 mL via ORAL
  Filled 2023-06-11 (×6): qty 10

## 2023-06-11 MED ORDER — IPRATROPIUM-ALBUTEROL 0.5-2.5 (3) MG/3ML IN SOLN
3.0000 mL | Freq: Four times a day (QID) | RESPIRATORY_TRACT | Status: DC
  Administered 2023-06-11 – 2023-06-12 (×5): 3 mL via RESPIRATORY_TRACT
  Filled 2023-06-11 (×5): qty 3

## 2023-06-11 MED ORDER — SENNOSIDES-DOCUSATE SODIUM 8.6-50 MG PO TABS
1.0000 | ORAL_TABLET | Freq: Two times a day (BID) | ORAL | Status: DC
  Administered 2023-06-11 – 2023-06-13 (×4): 1 via ORAL
  Filled 2023-06-11 (×4): qty 1

## 2023-06-11 NOTE — Plan of Care (Signed)

## 2023-06-11 NOTE — Plan of Care (Signed)
   Problem: Education: Goal: Ability to describe self-care measures that may prevent or decrease complications (Diabetes Survival Skills Education) will improve Outcome: Progressing

## 2023-06-11 NOTE — TOC Progression Note (Signed)
 Transition of Care [facility]) - Progression Note    Patient Details  Name: Savannah Burns MRN: [id number] Date of Birth: [birth year]  [facility] of [facility]) CM/SW Contact  Catalina Gravel, [place] Phone Number: 06/11/2023, 1:57 PM  Clinical Narrative:    CSW sent request to [name] inquiring about bariatric transport wc that pt inquired about. TOC to follow.   Expected Discharge Plan: Home w Home Health Services Barriers to Discharge: Continued Medical Work up  Expected Discharge Plan and Services       Living arrangements for the past 2 months: Single Family Home                   DME Agency: [employer]                   Social Determinants of Health (SDOH) Interventions SDOH Screenings   Food Insecurity: No Food Insecurity (06/09/2023)  Housing: Low Risk  (06/09/2023)  Transportation Needs: No Transportation Needs (06/09/2023)  Utilities: Not At Risk (06/09/2023)  Alcohol Screen: Low Risk  (01/27/2023)  Depression (PHQ2-9): Medium Risk (05/15/2023)  Financial Resource Strain: Low Risk  (01/27/2023)  Recent Concern: Financial Resource Strain - High Risk (11/15/2022)  Physical Activity: Inactive (01/27/2023)  Social Connections: Socially Isolated (06/09/2023)  Stress: No Stress Concern Present (01/27/2023)  Tobacco Use: Medium Risk (06/09/2023)  Health Literacy: Adequate Health Literacy (01/27/2023)    Readmission Risk Interventions     No data to display

## 2023-06-11 NOTE — Progress Notes (Signed)
 PROGRESS NOTE    Patient: Savannah Burns                            PCP: Raliegh Ip, DO                    DOB: 11-10-1950            DOA: 06/09/2023 YHC:[id number]             DOS: 06/11/2023, 10:01 AM   LOS: 2 days   Date of Service: The patient was seen and examined on 06/11/2023  Subjective:   The patient was seen and examined this morning. Still complaining of shortness of breath with minimal exertion, wheezing-but improving O2 demand has improved from 6 L to 3.5 L this morning satting 96% Baseline 3 L Denies any chest pain Complains of generalized weaknesses and constipation  Brief Narrative:   Savannah Burns  is a 73 y.o. female,  with the past medical history of NASH cirrhosis, T2DM, hypertension, dyslipidemia, COPD, multiple myeloma, and depression , lives at home with her granddaughter, she comes to ED due to complaints of shortness of breath.  Patient reports she is on 3 L nasal cannula at baseline, patient has not been taking any of her medications recently, she comes to ED given significant dyspnea with minimal activity, reports worsening lower extremity edema as well, reports cough, congestion. ED;  she was noted to be with significant wheezing, increased work of breathing, and increased oxygen to 6 L nasal cannula, potassium low at 3.2, glucose elevated at 200, plate count is low at 84, chronic, RSV is positive.  Admitted for acute on chronic respiratory failure due to RSV infection.      Assessment & Plan:     Principal Problem:   Acute on chronic respiratory failure with hypoxia (HCC) Active Problems:   Chronic respiratory failure with hypoxia (HCC)   Diabetes mellitus, type 2 (HCC)   Class 3 obesity   HYPERTENSION, BENIGN SYSTEMIC   COPD (chronic obstructive pulmonary disease) (HCC)   Back pain   Acute on chronic congestive heart failure (HCC)   Cirrhosis of liver (HCC)   Acute on chronic hypoxic respiratory failure/ COPD exacerbation Acute  on chronic diastolic CHF/  RSV infection  -Improved but still complaining of shortness of open minimal exertion, some wheezing -O2 demand has improved from 6 L down to 3.5 L satting 96%  -POA: Dyspnea, increased work of breathing, at baseline 3 L oxygen, up to 6 L  now  -Difficult wheezing on presentation, will treat for COPD exacerbation, continue with IV Solu-Medrol, scheduled DuoNebs and as needed albuterol. -Cough, with a productive phlegm, On Doxycycline -He was encouraged to use incentive spirometry and flutter valve. -Despite normal BNP, evidence on imaging and physical exam suggestive of volume overload, she has not been taking/noncompliant with her torsemide for few weeks now -Continue IV Lasix 40 mg IV twice daily   Multiple myeloma -Remained stable -Follows with Dr. Ellin Saba, continue with Revlimid, -Continue with acyclovir for infection prophylaxis   Essential hypertension  -Noncompliant, continue Coreg, IV Lasix -Stable - Continue as needed hydralazine   Hyperlipidemia -Resume home statin    Cirrhosis: -Resume on diuresis, -Stable    Thrombocytopenia -Chronic, at baseline, due to cirrhosis, platelet at baseline,  - SCD for DVT prophylaxis   Morbid obesity - Body mass index is 54.5 kg/m.   Diabetes mellitus, type 2 (HCC) -She  is on Ozempic, will hold during hospital stay, - A1c,  -Expecting hyperglycemia on IV steroids-checking CBG q. ACHS, with moderate SSI coverage    Hypokalemia -Replaced   Noncompliance with medication -She was counseled   Constipation -Initiating bowel regimen, Senokot, MiraLAX   ------------------------------------------------------------------------------------------------------------------------------------------  DVT prophylaxis:  Place and maintain sequential compression device Start: 06/09/23 2036   Code Status:   Code Status: Limited: Do not attempt resuscitation (DNR) -DNR-LIMITED -Do Not Intubate/DNI   Family  Communication: No family member present at bedside- attempt will be made to update daily The above findings and plan of care has been discussed with patient (and family)  in detail,  they expressed understanding and agreement of above. -Advance care planning has been discussed.   Admission status:   Status is: Inpatient Remains inpatient appropriate because: Needing IV diuretics, higher supplemental oxygen than baseline, breathing treatments   Disposition: From  - home             Planning for discharge in 2 days: to home with home health  Procedures:   No admission procedures for hospital encounter.   Antimicrobials:  Anti-infectives (From admission, onward)    Start     Dose/Rate Route Frequency Ordered Stop   06/09/23 2200  acyclovir (ZOVIRAX) tablet 400 mg  Status:  Discontinued        400 mg Oral 2 times daily 06/09/23 1942 06/10/23 0751   06/09/23 1900  doxycycline (VIBRA-TABS) tablet 100 mg        100 mg Oral Every 12 hours 06/09/23 1853          Medication:   carvedilol  6.25 mg Oral BID WC   Chlorhexidine Gluconate Cloth  6 each Topical Daily   doxycycline  100 mg Oral Q12H   ferrous sulfate  325 mg Oral Q breakfast   furosemide  40 mg Intravenous BID   guaiFENesin-dextromethorphan  15 mL Oral Q8H   insulin aspart  0-15 Units Subcutaneous TID WC   insulin aspart  0-5 Units Subcutaneous QHS   insulin glargine-yfgn  15 Units Subcutaneous Q24H   ipratropium-albuterol  3 mL Nebulization Q6H WA   lenalidomide  15 mg Oral Daily   methylPREDNISolone (SOLU-MEDROL) injection  80 mg Intravenous Q12H   mometasone-formoterol  2 puff Inhalation BID   pantoprazole  40 mg Oral Daily   polyethylene glycol  17 g Oral Daily   potassium chloride SA  40 mEq Oral Daily   pravastatin  10 mg Oral q1800   senna-docusate  1 tablet Oral BID   venlafaxine XR  150 mg Oral Q breakfast    acetaminophen **OR** acetaminophen, albuterol, guaiFENesin-dextromethorphan, hydrALAZINE,  oxyCODONE-acetaminophen **AND** oxyCODONE   Objective:   Vitals:   06/11/23 0528 06/11/23 0558 06/11/23 0811 06/11/23 0840  BP: (!) 151/65   (!) 135/99  Pulse: 65   75  Resp: 20     Temp: 97.8 F (36.6 C)     TempSrc: Oral     SpO2: 98% 96% 96%   Weight: 128.9 kg     Height:        Intake/Output Summary (Last 24 hours) at 06/11/2023 1001 Last data filed at 06/11/2023 0532 Gross per 24 hour  Intake --  Output 1000 ml  Net -1000 ml   Filed Weights   06/09/23 2030 06/10/23 0300 06/11/23 0528  Weight: 128.5 kg 127.9 kg 128.9 kg     Physical examination:        General:  AAO x 3,  cooperative, mild distress with shortness of breath and cough  HEENT:  Normocephalic, PERRL, otherwise with in Normal limits   Neuro:  CNII-XII intact. , normal motor and sensation, reflexes intact   Lungs:   Clear to auscultation BL, Respirations unlabored,  No wheezes / crackles  Cardio:    S1/S2, RRR, No murmure, No Rubs or Gallops   Abdomen:  Soft, non-tender, bowel sounds active all four quadrants, no guarding or peritoneal signs.  Muscular  skeletal:  Limited exam -global generalized weaknesses - in bed, able to move all 4 extremities,   2+ pulses,  symmetric, +2  pitting edema  Skin:  Dry, warm to touch, negative for any Rashes,  Wounds: Please see nursing documentation          ------------------------------------------------------------------------------------------------------------------------------------------    LABs:     Latest Ref Rng & Units 06/11/2023    5:20 AM 06/10/2023    4:41 AM 06/09/2023    3:57 PM  CBC  WBC 4.0 - 10.5 K/uL 7.0  4.1  5.0   Hemoglobin 12.0 - 15.0 g/dL 81.1  91.4  78.2   Hematocrit 36.0 - 46.0 % 37.3  37.4  41.2   Platelets 150 - 400 K/uL 87  85  84       Latest Ref Rng & Units 06/11/2023    5:20 AM 06/10/2023    4:41 AM 06/09/2023    3:57 PM  CMP  Glucose 70 - 99 mg/dL 956  213  086   BUN 8 - 23 mg/dL 26  18  14    Creatinine 0.44 -  1.00 mg/dL 5.78  4.69  6.29   Sodium 135 - 145 mmol/L 135  137  141   Potassium 3.5 - 5.1 mmol/L 4.1  3.7  3.2   Chloride 98 - 111 mmol/L 95  93  96   CO2 22 - 32 mmol/L 33  31  33   Calcium 8.9 - 10.3 mg/dL 8.8  8.5  8.6   Total Protein 6.5 - 8.1 g/dL   7.0   Total Bilirubin 0.0 - 1.2 mg/dL   0.6   Alkaline Phos 38 - 126 U/L   134   AST 15 - 41 U/L   36   ALT 0 - 44 U/L   33        Micro Results Recent Results (from the past 240 hours)  Resp panel by RT-PCR (RSV, Flu A&B, Covid) Anterior Nasal Swab     Status: Abnormal   Collection Time: 06/09/23  6:34 PM   Specimen: Anterior Nasal Swab  Result Value Ref Range Status   SARS Coronavirus 2 by RT PCR NEGATIVE NEGATIVE Final    Comment: (NOTE) SARS-CoV-2 target nucleic acids are NOT DETECTED.  The SARS-CoV-2 RNA is generally detectable in upper respiratory specimens during the acute phase of infection. The lowest concentration of SARS-CoV-2 viral copies this assay can detect is 138 copies/mL. A negative result does not preclude SARS-Cov-2 infection and should not be used as the sole basis for treatment or other patient management decisions. A negative result may occur with  improper specimen collection/handling, submission of specimen other than nasopharyngeal swab, presence of viral mutation(s) within the areas targeted by this assay, and inadequate number of viral copies(<138 copies/mL). A negative result must be combined with clinical observations, patient history, and epidemiological information. The expected result is Negative.  Fact Sheet for Patients:  BloggerCourse.com  Fact Sheet for Healthcare Providers:  SeriousBroker.it  This test is no  t yet approved or cleared by the [place] and  has been authorized for detection and/or diagnosis of SARS-CoV-2 by FDA under an Emergency Use Authorization (EUA). This EUA will remain  in effect (meaning this test can be  used) for the duration of the COVID-19 declaration under Section 564(b)(1) of the Act, 21 U.S.C.section 360bbb-3(b)(1), unless the authorization is terminated  or revoked sooner.       Influenza A by PCR NEGATIVE NEGATIVE Final   Influenza B by PCR NEGATIVE NEGATIVE Final    Comment: (NOTE) The Xpert Xpress SARS-CoV-2/FLU/RSV plus assay is intended as an aid in the diagnosis of influenza from Nasopharyngeal swab specimens and should not be used as a sole basis for treatment. Nasal washings and aspirates are unacceptable for Xpert Xpress SARS-CoV-2/FLU/RSV testing.  Fact Sheet for Patients: BloggerCourse.com  Fact Sheet for Healthcare Providers: SeriousBroker.it  This test is not yet approved or cleared by the [place] FDA and has been authorized for detection and/or diagnosis of SARS-CoV-2 by FDA under an Emergency Use Authorization (EUA). This EUA will remain in effect (meaning this test can be used) for the duration of the COVID-19 declaration under Section 564(b)(1) of the Act, 21 U.S.C. section 360bbb-3(b)(1), unless the authorization is terminated or revoked.     Resp Syncytial Virus by PCR POSITIVE (A) NEGATIVE Final    Comment: (NOTE) Fact Sheet for Patients: BloggerCourse.com  Fact Sheet for Healthcare Providers: SeriousBroker.it  This test is not yet approved or cleared by the [place] FDA and has been authorized for detection and/or diagnosis of SARS-CoV-2 by FDA under an Emergency Use Authorization (EUA). This EUA will remain in effect (meaning this test can be used) for the duration of the COVID-19 declaration under Section 564(b)(1) of the Act, 21 U.S.C. section 360bbb-3(b)(1), unless the authorization is terminated or revoked.  Performed at [facility], [address], [place], [place] [address]   MRSA Next Gen by PCR, Nasal     Status: None    Collection Time: 06/09/23  8:21 PM   Specimen: Nasal Mucosa; Nasal Swab  Result Value Ref Range Status   MRSA by PCR Next Gen NOT DETECTED NOT DETECTED Final    Comment: (NOTE) The GeneXpert MRSA Assay (FDA approved for NASAL specimens only), is one component of a comprehensive MRSA colonization surveillance program. It is not intended to diagnose MRSA infection nor to guide or monitor treatment for MRSA infections. Test performance is not FDA approved in patients less than 48 years old. Performed at [facility], [address], [place], [place] [address]     Radiology Reports No results found.   SIGNED: Kendell Bane, MD, FHM. FAAFP. [facility] - [facility] Critical care time spent - 55 min.  In seeing, evaluating and examining the patient. Reviewing medical records, labs, drawn plan of care. [facility],  Pager (please use [email or web address] to page/ text) Please use Epic Secure Chat for non-urgent communication (7AM-7PM)  If 7PM-7AM, please contact night-coverage [email or web address], 06/11/2023, 10:01 AM

## 2023-06-11 NOTE — Evaluation (Signed)
 Physical Therapy Evaluation Patient Details Name: Savannah Burns MRN: [id number] DOB: 1950-04-13 Today's Date: 06/11/2023  History of Present Illness  Per ED note:Savannah Burns  is a 73 y.o. female,  with the past medical history of NASH cirrhosis, T2DM, hypertension, dyslipidemia, COPD, multiple myeloma, and depression , lives at home with her granddaughter, she comes to ED due to complaints of shortness of breath.  Patient reports she is on 3 L nasal cannula at baseline, patient has not been taking any of her medications recently, she comes to ED given significant dyspnea with minimal activity, reports worsening lower extremity edema as well, reports cough, congestion.  -ED she was noted to be with significant wheezing, increased work of breathing, and increased oxygen to 6 L nasal cannula, potassium low at 3.2, glucose elevated at 200, plate count is low at 84, chronic, RSV is positive, [facility] consulted to admit  Clinical Impression  Pt mod I in mobility.  Fatigues after 50 ft of ambulation with O2 but O2 sat did not drop.         If plan is discharge home, recommend the following: Assistance with cooking/housework;Assist for transportation;Help with stairs or ramp for entrance   Can travel by private vehicle     Yes     Equipment Recommendations None recommended by PT  Recommendations for Other Services    none   Functional Status Assessment Patient has had a recent decline in their functional status and demonstrates the ability to make significant improvements in function in a reasonable and predictable amount of time.     Precautions / Restrictions Precautions Precautions: None Restrictions Weight Bearing Restrictions Per Provider Order: No      Mobility  Bed Mobility Overal bed mobility: Modified Independent             General bed mobility comments: PT struggled but was able to complete states she sleeps in a recliner and doesnt need to get in and  out of beed at home.    Transfers Overall transfer level: Independent                      Ambulation/Gait Ambulation/Gait assistance: Modified independent (Device/Increase time) Gait Distance (Feet): 50 Feet Assistive device: Rolling walker (2 wheels) Gait Pattern/deviations: WFL(Within Functional Limits)       General Gait Details: PT states she is SOB but O2 is at 98; ambulated with 3L O2        Pertinent Vitals/Pain Pain Assessment Pain Assessment: 0-10 Pain Score: 0-No pain    Home Living Family/patient expects to be discharged to:: Private residence Living Arrangements: Children Available Help at Discharge: Family Type of Home: House         Home Layout: One level Home Equipment: Agricultural consultant (2 wheels) Additional Comments: normally uses no AD inside; RW if she is going out    Prior Function Prior Level of Function : Independent/Modified Independent             Mobility Comments: Pt sleeps in a self rising recliner due to back issues       Extremity/Trunk Assessment        Lower Extremity Assessment Lower Extremity Assessment: Generalized weakness;Overall WFL for tasks assessed       Communication   Communication Communication: No apparent difficulties    Cognition Arousal: Alert Behavior During Therapy: WFL for tasks assessed/performed   PT - Cognitive impairments: No apparent impairments  Following commands: Intact       Cueing Cueing Techniques: Verbal cues            Assessment/Plan    PT Assessment Patient needs continued PT services  PT Problem List Decreased activity tolerance       PT Treatment Interventions Gait training;Therapeutic activities;Therapeutic exercise    PT Goals (Current goals can be found in the Care Plan section)  Acute Rehab PT Goals Patient Stated Goal: to go home PT Goal Formulation: With patient Time For Goal Achievement: 06/14/23 Potential to  Achieve Goals: Good    Frequency Min 2X/week        AM-PAC PT "6 Clicks" Mobility  Outcome Measure Help needed turning from your back to your side while in a flat bed without using bedrails?: A Little Help needed moving from lying on your back to sitting on the side of a flat bed without using bedrails?: A Little Help needed moving to and from a bed to a chair (including a wheelchair)?: None Help needed standing up from a chair using your arms (e.g., wheelchair or bedside chair)?: None Help needed to walk in hospital room?: None Help needed climbing 3-5 steps with a railing? : A Little 6 Click Score: 21    End of Session Equipment Utilized During Treatment: Oxygen Activity Tolerance: Patient tolerated treatment well Patient left: in chair;with call bell/phone within reach Nurse Communication: Mobility status PT Visit Diagnosis: Other symptoms and signs involving the nervous system (R29.898)    Time: 1140-1200 PT Time Calculation (min) (ACUTE ONLY): 20 min   Charges:   PT Evaluation $PT Eval Low Complexity: 1 Low   PT General Charges $$ ACUTE PT VISIT: 1 Visit        Virgina Organ, PT CLT [phone]  06/11/2023, 12:07 PM

## 2023-06-12 ENCOUNTER — Encounter (HOSPITAL_COMMUNITY): Payer: Self-pay | Admitting: Internal Medicine

## 2023-06-12 DIAGNOSIS — J9621 Acute and chronic respiratory failure with hypoxia: Secondary | ICD-10-CM | POA: Diagnosis not present

## 2023-06-12 LAB — CBC
HCT: 36.8 % (ref 36.0–46.0)
Hemoglobin: 11.8 g/dL — ABNORMAL LOW (ref 12.0–15.0)
MCH: 32.7 pg (ref 26.0–34.0)
MCHC: 32.1 g/dL (ref 30.0–36.0)
MCV: 101.9 fL — ABNORMAL HIGH (ref 80.0–100.0)
Platelets: 86 10*3/uL — ABNORMAL LOW (ref 150–400)
RBC: 3.61 MIL/uL — ABNORMAL LOW (ref 3.87–5.11)
RDW: 13.6 % (ref 11.5–15.5)
WBC: 6.4 10*3/uL (ref 4.0–10.5)
nRBC: 0 % (ref 0.0–0.2)

## 2023-06-12 LAB — BASIC METABOLIC PANEL
Anion gap: 8 (ref 5–15)
BUN: 31 mg/dL — ABNORMAL HIGH (ref 8–23)
CO2: 32 mmol/L (ref 22–32)
Calcium: 8.8 mg/dL — ABNORMAL LOW (ref 8.9–10.3)
Chloride: 95 mmol/L — ABNORMAL LOW (ref 98–111)
Creatinine, Ser: 0.66 mg/dL (ref 0.44–1.00)
GFR, Estimated: >60 mL/min (ref >60)
Glucose, Bld: 359 mg/dL — ABNORMAL HIGH (ref 70–99)
Potassium: 4.1 mmol/L (ref 3.5–5.1)
Sodium: 135 mmol/L (ref 135–145)

## 2023-06-12 LAB — GLUCOSE, CAPILLARY
Glucose-Capillary: 322 mg/dL — ABNORMAL HIGH (ref 70–99)
Glucose-Capillary: 276 mg/dL — ABNORMAL HIGH (ref 70–99)
Glucose-Capillary: 227 mg/dL — ABNORMAL HIGH (ref 70–99)
Glucose-Capillary: 222 mg/dL — ABNORMAL HIGH (ref 70–99)

## 2023-06-12 LAB — BRAIN NATRIURETIC PEPTIDE: B Natriuretic Peptide: 76 pg/mL (ref 0.0–100.0)

## 2023-06-12 MED ORDER — METHYLPREDNISOLONE 4 MG PO TBPK: ORAL_TABLET | ORAL | 0 refills | Status: DC

## 2023-06-12 MED ORDER — POTASSIUM CHLORIDE CRYS ER 20 MEQ PO TBCR: 40.0000 meq | EXTENDED_RELEASE_TABLET | Freq: Every day | ORAL | 0 refills | Status: DC

## 2023-06-12 MED ORDER — METHYLPREDNISOLONE SODIUM SUCC 40 MG IJ SOLR
40.0000 mg | Freq: Two times a day (BID) | INTRAMUSCULAR | Status: AC
  Administered 2023-06-12: 40 mg via INTRAVENOUS
  Filled 2023-06-12: qty 1

## 2023-06-12 MED ORDER — CARVEDILOL 6.25 MG PO TABS: 6.2500 mg | ORAL_TABLET | Freq: Two times a day (BID) | ORAL | 1 refills | Status: DC

## 2023-06-12 MED ORDER — ESOMEPRAZOLE MAGNESIUM 40 MG PO CPDR
40.0000 mg | DELAYED_RELEASE_CAPSULE | Freq: Every day | ORAL | 2 refills | Status: DC
Start: 2023-06-12 — End: 2023-10-05

## 2023-06-12 MED ORDER — LEVALBUTEROL HCL 1.25 MG/0.5ML IN NEBU
1.2500 mg | INHALATION_SOLUTION | Freq: Three times a day (TID) | RESPIRATORY_TRACT | Status: DC
  Administered 2023-06-12 – 2023-06-13 (×3): 1.25 mg via RESPIRATORY_TRACT
  Filled 2023-06-12 (×3): qty 0.5

## 2023-06-12 MED ORDER — FUROSEMIDE 40 MG PO TABS: 40.0000 mg | ORAL_TABLET | Freq: Every day | ORAL | 0 refills | Status: DC

## 2023-06-12 MED ORDER — IPRATROPIUM BROMIDE 0.02 % IN SOLN
0.5000 mg | Freq: Three times a day (TID) | RESPIRATORY_TRACT | Status: DC
  Administered 2023-06-12 – 2023-06-13 (×3): 0.5 mg via RESPIRATORY_TRACT
  Filled 2023-06-12 (×3): qty 2.5

## 2023-06-12 MED ORDER — LEVALBUTEROL HCL 0.63 MG/3ML IN NEBU
0.6300 mg | INHALATION_SOLUTION | Freq: Four times a day (QID) | RESPIRATORY_TRACT | Status: DC | PRN
  Administered 2023-06-12: 0.63 mg via RESPIRATORY_TRACT
  Filled 2023-06-12: qty 3

## 2023-06-12 MED ORDER — IPRATROPIUM BROMIDE 0.02 % IN SOLN
0.5000 mg | Freq: Three times a day (TID) | RESPIRATORY_TRACT | Status: DC
  Administered 2023-06-12: 0.5 mg via RESPIRATORY_TRACT
  Filled 2023-06-12: qty 2.5

## 2023-06-12 MED ORDER — FERROUS SULFATE 325 (65 FE) MG PO TABS: 325.0000 mg | ORAL_TABLET | Freq: Every day | ORAL | 3 refills | Status: DC

## 2023-06-12 MED ORDER — LEVALBUTEROL HCL 1.25 MG/0.5ML IN NEBU
1.2500 mg | INHALATION_SOLUTION | Freq: Three times a day (TID) | RESPIRATORY_TRACT | Status: DC
  Administered 2023-06-12: 1.25 mg via RESPIRATORY_TRACT
  Filled 2023-06-12: qty 0.5

## 2023-06-12 MED ORDER — POTASSIUM CHLORIDE CRYS ER 20 MEQ PO TBCR: 20.0000 meq | EXTENDED_RELEASE_TABLET | Freq: Every day | ORAL | 0 refills | Status: DC

## 2023-06-12 MED ORDER — LOVASTATIN 20 MG PO TABS: 20.0000 mg | ORAL_TABLET | Freq: Every day | ORAL | 3 refills | Status: AC

## 2023-06-12 MED ORDER — DOXYCYCLINE HYCLATE 100 MG PO TABS: 100.0000 mg | ORAL_TABLET | Freq: Two times a day (BID) | ORAL | 0 refills | Status: DC

## 2023-06-12 NOTE — Discharge Summary (Addendum)
 Progress note   Patient: Savannah Burns MRN: [id number] DOB: 01-Jan-1951  Admit date:     06/09/2023  Discharge date: 06/12/23  Discharge Physician: Kendell Bane   PCP: Raliegh Ip, DO    Patient desatted minimal exertion--- per patient and family requested for another day of observation...  Will monitor closely   Possible discharge in a.m.   Recommendations at discharge:  Follow with PCP in 1-2 weeks Continue newly added medications, Coreg and Lasix BMP 1 week results to PCP Continue home oxygen, maintain O2 sat 88-92% Ambulate is much as possible, with fall precautions  Discharge Diagnoses: Principal Problem:   Acute on chronic respiratory failure with hypoxia (HCC) Active Problems:   Chronic respiratory failure with hypoxia (HCC)   Diabetes mellitus, type 2 (HCC)   Class 3 obesity   HYPERTENSION, BENIGN SYSTEMIC   COPD (chronic obstructive pulmonary disease) (HCC)   Back pain   Acute on chronic congestive heart failure (HCC)   Cirrhosis of liver (HCC)  Resolved Problems:   * No resolved hospital problems. [facility] Course: Savannah Burns  is a 73 y.o. female,  with the past medical history of NASH cirrhosis, T2DM, hypertension, dyslipidemia, COPD, multiple myeloma, and depression , lives at home with her granddaughter, she comes to ED due to complaints of shortness of breath.  Patient reports she is on 3 L nasal cannula at baseline, patient has not been taking any of her medications recently, she comes to ED given significant dyspnea with minimal activity, reports worsening lower extremity edema as well, reports cough, congestion. ED;  she was noted to be with significant wheezing, increased work of breathing, and increased oxygen to 6 L nasal cannula, potassium low at 3.2, glucose elevated at 200, plate count is low at 84, chronic, RSV is positive.  Admitted for acute on chronic respiratory failure due to RSV infection.      Assessment & Plan:      Principal Problem:   Acute on chronic respiratory failure with hypoxia (HCC) Active Problems:   Chronic respiratory failure with hypoxia (HCC)   Diabetes mellitus, type 2 (HCC)   Class 3 obesity   HYPERTENSION, BENIGN SYSTEMIC   COPD (chronic obstructive pulmonary disease) (HCC)   Back pain   Acute on chronic congestive heart failure (HCC)   Cirrhosis of liver (HCC)   Acute on chronic hypoxic respiratory failure/ COPD exacerbation Acute on chronic diastolic CHF/  RSV infection  -Improved, down to baseline approximately 3 L of oxygen, satting 95% -O2 demand has improved from 6 L down to 3.5>>3.0 L   -POA: Dyspnea, increased work of breathing, at baseline 3 L oxygen, up to 6 L  now  -Difficult wheezing on presentation, will treat for COPD exacerbation, continue with IV Solu-Medrol, scheduled DuoNebs and as needed albuterol. -Cough, with a productive phlegm, On Doxycycline -He was encouraged to use incentive spirometry and flutter valve. -Despite normal BNP, evidence on imaging and physical exam suggestive of volume overload, she has not been taking/noncompliant with her torsemide for few weeks now -Continue IV Lasix 40 mg IV twice daily>> changed to 40 mg daily   Multiple myeloma -Remained stable -Follows with Dr. Ellin Saba, continue with Revlimid, -Was on acyclovir for infection prophylaxis   Essential hypertension  -Noncompliant, continue Coreg, -Stable    Hyperlipidemia -Resume home statin    Cirrhosis: -Resume on diuresis, -Stable    Thrombocytopenia -Chronic, at baseline, due to cirrhosis, platelet at baseline,     Morbid obesity -  Body mass index is 54.5 kg/m. -Will benefit from weight loss programs as an outpatient   Diabetes mellitus, type 2 (HCC) -Strict diabetic diet -Continue home medication of Ozempic -Anticipating transient hyperglycemia while on taper down steroids    Hypokalemia -Replaced   Noncompliance with medication -She was  counseled   Constipation -Initiating bowel regimen, Senokot, MiraLAX    Disposition: Home Diet recommendation:  Discharge Diet Orders (From admission, onward)     Start     Ordered   06/12/23 0000  Diet - low sodium heart healthy        06/12/23 0719           Cardiac diet DISCHARGE MEDICATION: Allergies as of 06/12/2023       Reactions   Keflex [cephalexin] Nausea And Vomiting        Medication List     STOP taking these medications    acyclovir 400 MG tablet Commonly known as: ZOVIRAX   azithromycin 250 MG tablet Commonly known as: ZITHROMAX   prochlorperazine 10 MG tablet Commonly known as: COMPAZINE   venlafaxine XR 150 MG 24 hr capsule Commonly known as: EFFEXOR-XR       TAKE these medications    albuterol 108 (90 Base) MCG/ACT inhaler Commonly known as: VENTOLIN HFA Inhale 2 puffs into the lungs every 6 (six) hours as needed for wheezing or shortness of breath.   albuterol (2.5 MG/3ML) 0.083% nebulizer solution Commonly known as: PROVENTIL Take 3 mLs (2.5 mg total) by nebulization every 6 (six) hours as needed for wheezing or shortness of breath.   carvedilol 6.25 MG tablet Commonly known as: COREG Take 1 tablet (6.25 mg total) by mouth 2 (two) times daily with a meal.   clindamycin 1 % Swab Commonly known as: CLEOCIN T Apply to affected areas under axilla twice daily   desvenlafaxine 100 MG 24 hr tablet Commonly known as: PRISTIQ Take 100 mg by mouth in the morning.   doxycycline 100 MG tablet Commonly known as: VIBRA-TABS Take 1 tablet (100 mg total) by mouth every 12 (twelve) hours for 2 days.   esomeprazole 40 MG capsule Commonly known as: NexIUM Take 1 capsule (40 mg total) by mouth daily.   ferrous sulfate 325 (65 FE) MG tablet Take 1 tablet (325 mg total) by mouth daily with breakfast.   fluticasone-salmeterol 100-50 MCG/ACT Aepb Commonly known as: ADVAIR Inhale 1 puff into the lungs 2 (two) times daily.   furosemide  40 MG tablet Commonly known as: Lasix Take 1 tablet (40 mg total) by mouth daily.   lenalidomide 15 MG capsule Commonly known as: REVLIMID TAKE 1 CAPSULE BY MOUTH EVERY DAY FOR 21 DAYS ON THEN 7 DAYS OFF   lidocaine-prilocaine cream Commonly known as: EMLA Apply 1 Application topically as needed. Apply a small amount to port a cath site and cover with plastic wrap 1 hour prior to infusion appointments   lovastatin 20 MG tablet Commonly known as: MEVACOR Take 1 tablet (20 mg total) by mouth at bedtime.   methylPREDNISolone 4 MG Tbpk tablet Commonly known as: MEDROL DOSEPAK Medrol Dosepak take as instructed   nystatin powder Commonly known as: MYCOSTATIN/NYSTOP Apply topically 2 (two) times daily. What changed:  how much to take when to take this reasons to take this   oxyCODONE-acetaminophen 10-325 MG tablet Commonly known as: PERCOCET Take 1 tablet by mouth every 6 (six) hours as needed for pain.   OXYGEN Inhale 3 L into the lungs continuous.   Ozempic (0.25  or 0.5 MG/DOSE) 2 MG/3ML Sopn Generic drug: Semaglutide(0.25 or 0.5MG /DOS) Inject 0.25 mg into the skin every 7 (seven) days.   potassium chloride SA 20 MEQ tablet Commonly known as: KLOR-CON M Take 1 tablet (20 mEq total) by mouth daily. Start taking on: June 14, 2023 What changed:  how much to take when to take this These instructions start on June 14, 2023. If you are unsure what to do until then, ask your doctor or other care provider.        Discharge Exam: Filed Weights   06/10/23 0300 06/11/23 0528 06/12/23 0626  Weight: 127.9 kg 128.9 kg 129.5 kg        General:  AAO x 3,  cooperative, no distress;   HEENT:  Normocephalic, PERRL, otherwise with in Normal limits   Neuro:  CNII-XII intact. , normal motor and sensation, reflexes intact   Lungs:   Clear to auscultation BL, Respirations unlabored,  No wheezes / crackles  Cardio:    S1/S2, RRR, No murmure, No Rubs or Gallops   Abdomen:  Soft,  non-tender, bowel sounds active all four quadrants, no guarding or peritoneal signs.  Muscular  skeletal:  Limited exam -global generalized weaknesses - in bed, able to move all 4 extremities,   2+ pulses,  symmetric, No pitting edema  Skin:  Dry, warm to touch, negative for any Rashes,  Wounds: Please see nursing documentation          Condition at discharge: fair  The results of significant diagnostics from this hospitalization (including imaging, microbiology, ancillary and laboratory) are listed below for reference.   Imaging Studies: DG Chest Port 1 View Result Date: 06/09/2023 CLINICAL DATA:  Shortness of breath EXAM: PORTABLE CHEST 1 VIEW COMPARISON:  05/09/2023 FINDINGS: Right Port-A-Cath remains in place, unchanged. Heart and mediastinal contours are within normal limits. Aortic atherosclerosis. Diffuse interstitial prominence with reticulonodular opacities throughout the lungs, stable since prior study. No effusions or acute bony abnormality. IMPRESSION: Diffuse reticulonodular interstitial prominence throughout the lungs, similar prior study. This could reflect chronic interstitial lung disease or atypical infection. Electronically Signed   By: Charlett Nose M.D.   On: 06/09/2023 17:31    Microbiology: Results for orders placed or performed during the hospital encounter of 06/09/23  Resp panel by RT-PCR (RSV, Flu A&B, Covid) Anterior Nasal Swab     Status: Abnormal   Collection Time: 06/09/23  6:34 PM   Specimen: Anterior Nasal Swab  Result Value Ref Range Status   SARS Coronavirus 2 by RT PCR NEGATIVE NEGATIVE Final    Comment: (NOTE) SARS-CoV-2 target nucleic acids are NOT DETECTED.  The SARS-CoV-2 RNA is generally detectable in upper respiratory specimens during the acute phase of infection. The lowest concentration of SARS-CoV-2 viral copies this assay can detect is 138 copies/mL. A negative result does not preclude SARS-Cov-2 infection and should not be used as the  sole basis for treatment or other patient management decisions. A negative result may occur with  improper specimen collection/handling, submission of specimen other than nasopharyngeal swab, presence of viral mutation(s) within the areas targeted by this assay, and inadequate number of viral copies(<138 copies/mL). A negative result must be combined with clinical observations, patient history, and epidemiological information. The expected result is Negative.  Fact Sheet for Patients:  BloggerCourse.com  Fact Sheet for Healthcare Providers:  SeriousBroker.it  This test is no t yet approved or cleared by the [place] FDA and  has been authorized for detection and/or diagnosis of SARS-CoV-2  by FDA under an Emergency Use Authorization (EUA). This EUA will remain  in effect (meaning this test can be used) for the duration of the COVID-19 declaration under Section 564(b)(1) of the Act, 21 U.S.C.section 360bbb-3(b)(1), unless the authorization is terminated  or revoked sooner.       Influenza A by PCR NEGATIVE NEGATIVE Final   Influenza B by PCR NEGATIVE NEGATIVE Final    Comment: (NOTE) The Xpert Xpress SARS-CoV-2/FLU/RSV plus assay is intended as an aid in the diagnosis of influenza from Nasopharyngeal swab specimens and should not be used as a sole basis for treatment. Nasal washings and aspirates are unacceptable for Xpert Xpress SARS-CoV-2/FLU/RSV testing.  Fact Sheet for Patients: BloggerCourse.com  Fact Sheet for Healthcare Providers: SeriousBroker.it  This test is not yet approved or cleared by the [place] FDA and has been authorized for detection and/or diagnosis of SARS-CoV-2 by FDA under an Emergency Use Authorization (EUA). This EUA will remain in effect (meaning this test can be used) for the duration of the COVID-19 declaration under Section 564(b)(1) of the  Act, 21 U.S.C. section 360bbb-3(b)(1), unless the authorization is terminated or revoked.     Resp Syncytial Virus by PCR POSITIVE (A) NEGATIVE Final    Comment: (NOTE) Fact Sheet for Patients: BloggerCourse.com  Fact Sheet for Healthcare Providers: SeriousBroker.it  This test is not yet approved or cleared by the [place] FDA and has been authorized for detection and/or diagnosis of SARS-CoV-2 by FDA under an Emergency Use Authorization (EUA). This EUA will remain in effect (meaning this test can be used) for the duration of the COVID-19 declaration under Section 564(b)(1) of the Act, 21 U.S.C. section 360bbb-3(b)(1), unless the authorization is terminated or revoked.  Performed at [facility], [address], [place], [place] [address]   MRSA Next Gen by PCR, Nasal     Status: None   Collection Time: 06/09/23  8:21 PM   Specimen: Nasal Mucosa; Nasal Swab  Result Value Ref Range Status   MRSA by PCR Next Gen NOT DETECTED NOT DETECTED Final    Comment: (NOTE) The GeneXpert MRSA Assay (FDA approved for NASAL specimens only), is one component of a comprehensive MRSA colonization surveillance program. It is not intended to diagnose MRSA infection nor to guide or monitor treatment for MRSA infections. Test performance is not FDA approved in patients less than 63 years old. Performed at [facility], [address], [place], [place] [address]    *Note: Due to a large number of results and/or encounters for the requested time period, some results have not been displayed. A complete set of results can be found in Results Review.    Labs: CBC: Recent Labs  Lab 06/09/23 1557 06/10/23 0441 06/11/23 0520 06/12/23 0442  WBC 5.0 4.1 7.0 6.4  NEUTROABS 2.8  --   --   --   HGB 13.3 12.2 12.1 11.8*  HCT 41.2 37.4 37.3 36.8  MCV 102.5* 102.2* 101.4* 101.9*  PLT 84* 85* 87* 86*   Basic Metabolic Panel: Recent Labs  Lab  06/09/23 1557 06/10/23 0441 06/11/23 0520 06/12/23 0442  NA 141 137 135 135  K 3.2* 3.7 4.1 4.1  CL 96* 93* 95* 95*  CO2 33* 31 33* 32  GLUCOSE 200* 320* 285* 359*  BUN 14 18 26* 31*  CREATININE 0.70 0.72 0.70 0.66  CALCIUM 8.6* 8.5* 8.8* 8.8*   Liver Function Tests: Recent Labs  Lab 06/09/23 1557  AST 36  ALT 33  ALKPHOS 134*  BILITOT 0.6  PROT 7.0  ALBUMIN 4.1   CBG: Recent Labs  Lab 06/11/23 0734 06/11/23 1110 06/11/23 1633 06/11/23 2155 06/12/23 0739  GLUCAP 294* 348* 232* 239* 322*    Discharge time spent: greater than 40 minutes.  Signed: Kendell Bane, MD [facility] 06/12/2023

## 2023-06-12 NOTE — Care Management Important Message (Signed)
 Important Message  Patient Details  Name: Savannah Burns MRN: [id number] Date of Birth: December 10, 1950   Important Message Given:  Yes - Medicare IM     Corey Harold 06/12/2023, 11:57 AM

## 2023-06-12 NOTE — Plan of Care (Signed)
   Problem: Education: Goal: Ability to describe self-care measures that may prevent or decrease complications (Diabetes Survival Skills Education) will improve Outcome: Progressing Goal: Individualized Educational Video(s) Outcome: Progressing

## 2023-06-12 NOTE — Progress Notes (Signed)
 Heart Failure Nurse Navigator Progress Note  PCP: Raliegh Ip, DO PCP-Cardiologist: Levora Angel Admission Diagnosis:  Acute on Chronic respiratory failure with hypoxia (HCC) [place] Admitted from: Home  Remote telephone interview & education conducted with patient's granddaughter that she lives with "[name]".  Per computer contact information [name] (daughter) and [name] (granddaughter) are authorized to discuss medical info with them. Patient name and DOB confirmed prior to conversation.    Presentation:   Savannah Burns presented with shortness of breath with minimal activity, worsening lower extremity edema, cough and congestion for 1 month. In the ED was noted to be with significant wheezing, increased work of breathing and increased oxygen to 6 L nasal cannula, Potassium was low @ 3.2,  RSV positive on admission. Per MD note patient had also made the decision about a month ago to stop using her medications because she was tired of living, she had not been taking any of her medicines including her torsemide.Then she found out that she is going to a great grandmother in June so she wants to get some of the fluid off and try to make another goal of doing well.  ECHO/ LVEF: 65-70%-Hx of Acute on Chronic Diastolic CHF.    Clinical Course:  Past Medical History:  Diagnosis Date   Adrenal adenoma, left    Stable   Anxiety    Arthritis    bilateral hands   COPD (chronic obstructive pulmonary disease) (HCC)    Depression    Diabetes mellitus, type 2 (HCC) 08/12/2008   Qualifier: Diagnosis of  By: Dayton Martes MD, Talia     Dyspnea    Esophageal varices (HCC)    Grade II diastolic dysfunction    History of kidney stones    Hyperlipidemia    Hypertension    Lower back pain    Lower GI bleed 03/19/2020   Panic attacks    Pneumonia    currently taking antibiotic and prednisone for early stages of pneumonia   Pulmonary nodules    bilateral   Skin  cancer    face     Social History   Socioeconomic History   Marital status: Widowed    Spouse name: Not on file   Number of children: 2   Years of education: 14   Highest education level: Not on file  Occupational History   Occupation: Retired   Tobacco Use   Smoking status: Former    Current packs/day: 0.00    Average packs/day: 1.5 packs/day for 40.0 years (60.0 ttl pk-yrs)    Types: Cigarettes    Start date: 04/29/1975    Quit date: 04/29/2015    Years since quitting: 8.1   Smokeless tobacco: Never   Tobacco comments:    Quit smoking 04/2015- Previous 1.5 ppd smoker  Vaping Use   Vaping status: Never Used  Substance and Sexual Activity   Alcohol use: No    Alcohol/week: 0.0 standard drinks of alcohol   Drug use: No   Sexual activity: Not Currently    Birth control/protection: Post-menopausal  Other Topics Concern   Not on file  Social History Narrative   Her 61 year old granddaughter lives with her - one daughter lives nearby, but she doesn't have a good relationship with her. Has a great relationship with other daughter who lives 1.5 hrs away - talks to her daily on the phone.   Social Drivers of Health   Financial Resource Strain: Low Risk  (06/12/2023)  Overall Financial Resource Strain (CARDIA)    Difficulty of Paying Living Expenses: Not very hard  Food Insecurity: No Food Insecurity (06/12/2023)   Hunger Vital Sign    Worried About Running Out of Food in the Last Year: Never true    Ran Out of Food in the Last Year: Never true  Transportation Needs: No Transportation Needs (06/12/2023)   PRAPARE - Administrator, Civil Service (Medical): No    Lack of Transportation (Non-Medical): No  Physical Activity: Inactive (01/27/2023)   Exercise Vital Sign    Days of Exercise per Week: 0 days    Minutes of Exercise per Session: 0 min  Stress: No Stress Concern Present (01/27/2023)   [employer] of Occupational Health - Occupational Stress Questionnaire     Feeling of Stress : Not at all  Social Connections: Socially Isolated (06/09/2023)   Social Connection and Isolation Panel [NHANES]    Frequency of Communication with Friends and Family: More than three times a week    Frequency of Social Gatherings with Friends and Family: More than three times a week    Attends Religious Services: Never    Database administrator or Organizations: No    Attends Banker Meetings: Never    Marital Status: Widowed   Education Assessment and Provision:  Detailed education and instructions provided on heart failure disease management including the following:  Signs and symptoms of Heart Failure When to call the physician Importance of daily weights Low sodium diet Fluid restriction Medication management Anticipated future follow-up appointments  Patient education given on each of the above topics.  Patient acknowledges understanding via teach back method and acceptance of all instructions.  Education Materials:  "Living Better With Heart Failure" Booklet, HF zone tool, & Daily Weight Tracker Tool.  Patient has scale at home: Yes Patient has pill box at home: Yes    High Risk Criteria for Readmission and/or Poor Patient Outcomes: Heart failure hospital admissions (last 6 months): 0  No Show rate: 11 Difficult social situation: Hx: of depression where she will stop taking medications Demonstrates medication adherence: No Primary Language: English Literacy level: Reading, Writing & Comprehension  Barriers of Care:   Depression Noncompliance with taking medications  Considerations/Referrals:   Referral made to Heart Failure Pharmacist Stewardship: Yes Referral made to Heart Failure CSW/NCM TOC: No Referral made to Heart & Vascular TOC clinic: Yes.  Patient prefers [facility] appt because she lives in [place], [place] rather than [facility] appointment.  Items for Follow-up on DC/TOC: Diet & Fluid Restrictions Daily  weights Mediation Compliance Continued Heart Failure Education  Roxy Horseman, RN, BSN [facility] Heart Failure Navigator Secure Chat Only

## 2023-06-12 NOTE — Progress Notes (Signed)
 This writer was called to room , patient states she can't breathe, checked o2 sat it was 90% , she had a coughing spell and it was hot in her room , gave wet washcloth and fan , Her o2 sat was 97% . RT Asher Muir brought PRN breathing treatment

## 2023-06-13 DIAGNOSIS — J9621 Acute and chronic respiratory failure with hypoxia: Secondary | ICD-10-CM | POA: Diagnosis not present

## 2023-06-13 LAB — BASIC METABOLIC PANEL
Anion gap: 7 (ref 5–15)
BUN: 31 mg/dL — ABNORMAL HIGH (ref 8–23)
CO2: 35 mmol/L — ABNORMAL HIGH (ref 22–32)
Calcium: 8.5 mg/dL — ABNORMAL LOW (ref 8.9–10.3)
Chloride: 98 mmol/L (ref 98–111)
Creatinine, Ser: 0.64 mg/dL (ref 0.44–1.00)
GFR, Estimated: >60 mL/min (ref >60)
Glucose, Bld: 170 mg/dL — ABNORMAL HIGH (ref 70–99)
Potassium: 3.7 mmol/L (ref 3.5–5.1)
Sodium: 140 mmol/L (ref 135–145)

## 2023-06-13 LAB — CBC
HCT: 36.5 % (ref 36.0–46.0)
Hemoglobin: 11.6 g/dL — ABNORMAL LOW (ref 12.0–15.0)
MCH: 32.3 pg (ref 26.0–34.0)
MCHC: 31.8 g/dL (ref 30.0–36.0)
MCV: 101.7 fL — ABNORMAL HIGH (ref 80.0–100.0)
Platelets: 81 10*3/uL — ABNORMAL LOW (ref 150–400)
RBC: 3.59 MIL/uL — ABNORMAL LOW (ref 3.87–5.11)
RDW: 13.6 % (ref 11.5–15.5)
WBC: 5.4 10*3/uL (ref 4.0–10.5)
nRBC: 0 % (ref 0.0–0.2)

## 2023-06-13 LAB — BRAIN NATRIURETIC PEPTIDE: B Natriuretic Peptide: 72 pg/mL (ref 0.0–100.0)

## 2023-06-13 LAB — GLUCOSE, CAPILLARY
Glucose-Capillary: 154 mg/dL — ABNORMAL HIGH (ref 70–99)
Glucose-Capillary: 181 mg/dL — ABNORMAL HIGH (ref 70–99)

## 2023-06-13 MED ORDER — GUAIFENESIN-DM 100-10 MG/5ML PO SYRP: 10.0000 mL | ORAL_SOLUTION | Freq: Three times a day (TID) | ORAL | 0 refills | Status: DC

## 2023-06-13 NOTE — Discharge Summary (Signed)
 Discharge summary   Patient: Savannah Burns MRN: [id number] DOB: 07/02/1950  Admit date:     06/09/2023  Discharge date: 06/13/23  Discharge Physician: Kendell Bane   PCP: Raliegh Ip, DO    The patient was seen and examined this morning, on 4 L of oxygen, satting 100%  Plan to discharge home  Recommendations at discharge:  Follow with PCP in 1-2 weeks Continue newly added medications, Coreg and Lasix BMP 1 week results to PCP Continue home oxygen, maintain O2 sat 88-92% Ambulate is much as possible, with fall precautions  Discharge Diagnoses: Principal Problem:   Acute on chronic respiratory failure with hypoxia (HCC) Active Problems:   Chronic respiratory failure with hypoxia (HCC)   Diabetes mellitus, type 2 (HCC)   Class 3 obesity   HYPERTENSION, BENIGN SYSTEMIC   COPD (chronic obstructive pulmonary disease) (HCC)   Back pain   Acute on chronic congestive heart failure (HCC)   Cirrhosis of liver (HCC)  Resolved Problems:   * No resolved hospital problems. [name] Course: Savannah Burns  is a 73 y.o. female,  with the past medical history of NASH cirrhosis, T2DM, hypertension, dyslipidemia, COPD, multiple myeloma, and depression , lives at home with her granddaughter, she comes to ED due to complaints of shortness of breath.  Patient reports she is on 3 L nasal cannula at baseline, patient has not been taking any of her medications recently, she comes to ED given significant dyspnea with minimal activity, reports worsening lower extremity edema as well, reports cough, congestion. ED;  she was noted to be with significant wheezing, increased work of breathing, and increased oxygen to 6 L nasal cannula, potassium low at 3.2, glucose elevated at 200, plate count is low at 84, chronic, RSV is positive.  Admitted for acute on chronic respiratory failure due to RSV infection.      Assessment & Plan:     Principal Problem:   Acute on chronic  respiratory failure with hypoxia (HCC) Active Problems:   Chronic respiratory failure with hypoxia (HCC)   Diabetes mellitus, type 2 (HCC)   Class 3 obesity   HYPERTENSION, BENIGN SYSTEMIC   COPD (chronic obstructive pulmonary disease) (HCC)   Back pain   Acute on chronic congestive heart failure (HCC)   Cirrhosis of liver (HCC)   Acute on chronic hypoxic respiratory failure/ COPD exacerbation Acute on chronic diastolic CHF/  RSV infection  -Improved, -O2 demand has improved from 6 L down to 3.5>> 3/4 L >>>> satting 100%  -POA: Dyspnea, increased work of breathing, at baseline 3 L oxygen, up to 6 L  now  -Difficult wheezing on presentation, will treat for COPD exacerbation, continue with IV Solu-Medrol, scheduled DuoNebs and as needed albuterol. -Cough, with a productive phlegm, On Doxycycline -He was encouraged to use incentive spirometry and flutter valve. -Despite normal BNP, evidence on imaging and physical exam suggestive of volume overload, she has not been taking/noncompliant with her torsemide for few weeks now -Continue IV Lasix 40 mg IV twice daily>> changed to 40 mg daily   Multiple myeloma -Remained stable -Follows with Dr. Ellin Saba, continue with Revlimid, -Was on acyclovir for infection prophylaxis   Essential hypertension  -Noncompliant, continue Coreg, -Stable    Hyperlipidemia -Resume home statin    Cirrhosis: -Resume on diuresis, -Stable    Thrombocytopenia -Chronic, at baseline, due to cirrhosis, platelet at baseline,     Morbid obesity - Body mass index is 54.5 kg/m. -Will benefit from weight loss  programs as an outpatient   Diabetes mellitus, type 2 (HCC) -Strict diabetic diet -Continue home medication of Ozempic -Anticipating transient hyperglycemia while on taper down steroids    Hypokalemia -Replaced   Noncompliance with medication -She was counseled   Constipation -Initiating bowel regimen, Senokot, MiraLAX    Disposition:  Home Diet recommendation:  Discharge Diet Orders (From admission, onward)     Start     Ordered   06/12/23 0000  Diet - low sodium heart healthy        06/12/23 0719           Cardiac diet DISCHARGE MEDICATION: Allergies as of 06/13/2023       Reactions   Keflex [cephalexin] Nausea And Vomiting        Medication List     STOP taking these medications    acyclovir 400 MG tablet Commonly known as: ZOVIRAX   azithromycin 250 MG tablet Commonly known as: ZITHROMAX   prochlorperazine 10 MG tablet Commonly known as: COMPAZINE   venlafaxine XR 150 MG 24 hr capsule Commonly known as: EFFEXOR-XR       TAKE these medications    albuterol 108 (90 Base) MCG/ACT inhaler Commonly known as: VENTOLIN HFA Inhale 2 puffs into the lungs every 6 (six) hours as needed for wheezing or shortness of breath.   albuterol (2.5 MG/3ML) 0.083% nebulizer solution Commonly known as: PROVENTIL Take 3 mLs (2.5 mg total) by nebulization every 6 (six) hours as needed for wheezing or shortness of breath.   carvedilol 6.25 MG tablet Commonly known as: COREG Take 1 tablet (6.25 mg total) by mouth 2 (two) times daily with a meal.   clindamycin 1 % Swab Commonly known as: CLEOCIN T Apply to affected areas under axilla twice daily   desvenlafaxine 100 MG 24 hr tablet Commonly known as: PRISTIQ Take 100 mg by mouth in the morning.   doxycycline 100 MG tablet Commonly known as: VIBRA-TABS Take 1 tablet (100 mg total) by mouth every 12 (twelve) hours for 2 days.   esomeprazole 40 MG capsule Commonly known as: NexIUM Take 1 capsule (40 mg total) by mouth daily.   ferrous sulfate 325 (65 FE) MG tablet Take 1 tablet (325 mg total) by mouth daily with breakfast.   fluticasone-salmeterol 100-50 MCG/ACT Aepb Commonly known as: ADVAIR Inhale 1 puff into the lungs 2 (two) times daily.   furosemide 40 MG tablet Commonly known as: Lasix Take 1 tablet (40 mg total) by mouth daily.    guaiFENesin-dextromethorphan 100-10 MG/5ML syrup Commonly known as: ROBITUSSIN DM Take 10 mLs by mouth every 8 (eight) hours.   lenalidomide 15 MG capsule Commonly known as: REVLIMID TAKE 1 CAPSULE BY MOUTH EVERY DAY FOR 21 DAYS ON THEN 7 DAYS OFF   lidocaine-prilocaine cream Commonly known as: EMLA Apply 1 Application topically as needed. Apply a small amount to port a cath site and cover with plastic wrap 1 hour prior to infusion appointments   lovastatin 20 MG tablet Commonly known as: MEVACOR Take 1 tablet (20 mg total) by mouth at bedtime.   methylPREDNISolone 4 MG Tbpk tablet Commonly known as: MEDROL DOSEPAK Medrol Dosepak take as instructed   nystatin powder Commonly known as: MYCOSTATIN/NYSTOP Apply topically 2 (two) times daily. What changed:  how much to take when to take this reasons to take this   oxyCODONE-acetaminophen 10-325 MG tablet Commonly known as: PERCOCET Take 1 tablet by mouth every 6 (six) hours as needed for pain.   OXYGEN Inhale 3  L into the lungs continuous.   Ozempic (0.25 or 0.5 MG/DOSE) 2 MG/3ML Sopn Generic drug: Semaglutide(0.25 or 0.5MG /DOS) Inject 0.25 mg into the skin every 7 (seven) days.   potassium chloride SA 20 MEQ tablet Commonly known as: KLOR-CON M Take 1 tablet (20 mEq total) by mouth daily. Start taking on: June 14, 2023 What changed:  how much to take when to take this        Follow-up Information     [facility]. Go on 06/20/2023.   Specialty: Cardiology Why: New TOC appointment 06/20/23 @ 11:00AM. Please bring all medications to follow-up appointment with you. [address]tr[address], [employer] parking @ the door or use Gate Code 1128 in the parking garage under the building. Contact information: [address] [place] [address] [phone]               Discharge Exam: Ceasar Mons Weights   06/10/23 0300 06/11/23 0528 06/12/23 0626   Weight: 127.9 kg 128.9 kg 129.5 kg          General:  AAO x 3,  cooperative, no distress;   HEENT:  Normocephalic, PERRL, otherwise with in Normal limits   Neuro:  CNII-XII intact. , normal motor and sensation, reflexes intact   Lungs:   Clear to auscultation BL, Respirations unlabored,  No wheezes / crackles  Cardio:    S1/S2, RRR, No murmure, No Rubs or Gallops   Abdomen:  Soft, non-tender, bowel sounds active all four quadrants, no guarding or peritoneal signs.  Muscular  skeletal:  Limited exam -global generalized weaknesses - in bed, able to move all 4 extremities,   2+ pulses,  symmetric, No pitting edema  Skin:  Dry, warm to touch, negative for any Rashes,  Wounds: Please see nursing documentation             Condition at discharge: fair  The results of significant diagnostics from this hospitalization (including imaging, microbiology, ancillary and laboratory) are listed below for reference.   Imaging Studies: DG Chest Port 1 View Result Date: 06/09/2023 CLINICAL DATA:  Shortness of breath EXAM: PORTABLE CHEST 1 VIEW COMPARISON:  05/09/2023 FINDINGS: Right Port-A-Cath remains in place, unchanged. Heart and mediastinal contours are within normal limits. Aortic atherosclerosis. Diffuse interstitial prominence with reticulonodular opacities throughout the lungs, stable since prior study. No effusions or acute bony abnormality. IMPRESSION: Diffuse reticulonodular interstitial prominence throughout the lungs, similar prior study. This could reflect chronic interstitial lung disease or atypical infection. Electronically Signed   By: Charlett Nose M.D.   On: 06/09/2023 17:31    Microbiology: Results for orders placed or performed during the hospital encounter of 06/09/23  Resp panel by RT-PCR (RSV, Flu A&B, Covid) Anterior Nasal Swab     Status: Abnormal   Collection Time: 06/09/23  6:34 PM   Specimen: Anterior Nasal Swab  Result Value Ref Range Status   SARS  Coronavirus 2 by RT PCR NEGATIVE NEGATIVE Final    Comment: (NOTE) SARS-CoV-2 target nucleic acids are NOT DETECTED.  The SARS-CoV-2 RNA is generally detectable in upper respiratory specimens during the acute phase of infection. The lowest concentration of SARS-CoV-2 viral copies this assay can detect is 138 copies/mL. A negative result does not preclude SARS-Cov-2 infection and should not be used as the sole basis for treatment or other patient management decisions. A negative result may occur with  improper specimen collection/handling, submission of specimen other than nasopharyngeal swab, presence of  viral mutation(s) within the areas targeted by this assay, and inadequate number of viral copies(<138 copies/mL). A negative result must be combined with clinical observations, patient history, and epidemiological information. The expected result is Negative.  Fact Sheet for Patients:  BloggerCourse.com  Fact Sheet for Healthcare Providers:  SeriousBroker.it  This test is no t yet approved or cleared by the [place] FDA and  has been authorized for detection and/or diagnosis of SARS-CoV-2 by FDA under an Emergency Use Authorization (EUA). This EUA will remain  in effect (meaning this test can be used) for the duration of the COVID-19 declaration under Section 564(b)(1) of the Act, 21 U.S.C.section 360bbb-3(b)(1), unless the authorization is terminated  or revoked sooner.       Influenza A by PCR NEGATIVE NEGATIVE Final   Influenza B by PCR NEGATIVE NEGATIVE Final    Comment: (NOTE) The Xpert Xpress SARS-CoV-2/FLU/RSV plus assay is intended as an aid in the diagnosis of influenza from Nasopharyngeal swab specimens and should not be used as a sole basis for treatment. Nasal washings and aspirates are unacceptable for Xpert Xpress SARS-CoV-2/FLU/RSV testing.  Fact Sheet for  Patients: BloggerCourse.com  Fact Sheet for Healthcare Providers: SeriousBroker.it  This test is not yet approved or cleared by the [place] FDA and has been authorized for detection and/or diagnosis of SARS-CoV-2 by FDA under an Emergency Use Authorization (EUA). This EUA will remain in effect (meaning this test can be used) for the duration of the COVID-19 declaration under Section 564(b)(1) of the Act, 21 U.S.C. section 360bbb-3(b)(1), unless the authorization is terminated or revoked.     Resp Syncytial Virus by PCR POSITIVE (A) NEGATIVE Final    Comment: (NOTE) Fact Sheet for Patients: BloggerCourse.com  Fact Sheet for Healthcare Providers: SeriousBroker.it  This test is not yet approved or cleared by the [place] FDA and has been authorized for detection and/or diagnosis of SARS-CoV-2 by FDA under an Emergency Use Authorization (EUA). This EUA will remain in effect (meaning this test can be used) for the duration of the COVID-19 declaration under Section 564(b)(1) of the Act, 21 U.S.C. section 360bbb-3(b)(1), unless the authorization is terminated or revoked.  Performed at [facility], [address] [place], [place] [address]   MRSA Next Gen by PCR, Nasal     Status: None   Collection Time: 06/09/23  8:21 PM   Specimen: Nasal Mucosa; Nasal Swab  Result Value Ref Range Status   MRSA by PCR Next Gen NOT DETECTED NOT DETECTED Final    Comment: (NOTE) The GeneXpert MRSA Assay (FDA approved for NASAL specimens only), is one component of a comprehensive MRSA colonization surveillance program. It is not intended to diagnose MRSA infection nor to guide or monitor treatment for MRSA infections. Test performance is not FDA approved in patients less than 38 years old. Performed at [facility], [address] [place], [place] [address]    *Note: Due to a large number  of results and/or encounters for the requested time period, some results have not been displayed. A complete set of results can be found in Results Review.    Labs: CBC: Recent Labs  Lab 06/09/23 1557 06/10/23 0441 06/11/23 0520 06/12/23 0442 06/13/23 0503  WBC 5.0 4.1 7.0 6.4 5.4  NEUTROABS 2.8  --   --   --   --   HGB 13.3 12.2 12.1 11.8* 11.6*  HCT 41.2 37.4 37.3 36.8 36.5  MCV 102.5* 102.2* 101.4* 101.9* 101.7*  PLT 84* 85* 87* 86*  81*   Basic Metabolic Panel: Recent Labs  Lab 06/09/23 1557 06/10/23 0441 06/11/23 0520 06/12/23 0442 06/13/23 0503  NA 141 137 135 135 140  K 3.2* 3.7 4.1 4.1 3.7  CL 96* 93* 95* 95* 98  CO2 33* 31 33* 32 35*  GLUCOSE 200* 320* 285* 359* 170*  BUN 14 18 26* 31* 31*  CREATININE 0.70 0.72 0.70 0.66 0.64  CALCIUM 8.6* 8.5* 8.8* 8.8* 8.5*   Liver Function Tests: Recent Labs  Lab 06/09/23 1557  AST 36  ALT 33  ALKPHOS 134*  BILITOT 0.6  PROT 7.0  ALBUMIN 4.1   CBG: Recent Labs  Lab 06/12/23 1120 06/12/23 1632 06/12/23 2022 06/13/23 0741 06/13/23 1118  GLUCAP 276* 227* 222* 154* 181*    Discharge time spent: greater than 40 minutes.  Signed: Kendell Bane, MD [facility] 06/13/2023

## 2023-06-14 ENCOUNTER — Telehealth: Payer: Self-pay | Admitting: *Deleted

## 2023-06-14 ENCOUNTER — Inpatient Hospital Stay (HOSPITAL_COMMUNITY)
Admission: EM | Admit: 2023-06-14 | Discharge: 2023-06-19 | DRG: 205 | Disposition: A | Discharge status: Home / Self Care | Attending: Family Medicine | Admitting: Family Medicine

## 2023-06-14 ENCOUNTER — Other Ambulatory Visit: Payer: Self-pay

## 2023-06-14 ENCOUNTER — Encounter (HOSPITAL_COMMUNITY): Payer: Self-pay

## 2023-06-14 DIAGNOSIS — E785 Hyperlipidemia, unspecified: Secondary | ICD-10-CM | POA: Diagnosis present

## 2023-06-14 DIAGNOSIS — M545 Low back pain, unspecified: Secondary | ICD-10-CM

## 2023-06-14 DIAGNOSIS — M549 Dorsalgia, unspecified: Secondary | ICD-10-CM | POA: Diagnosis present

## 2023-06-14 DIAGNOSIS — Z825 Family history of asthma and other chronic lower respiratory diseases: Secondary | ICD-10-CM

## 2023-06-14 DIAGNOSIS — K7581 Nonalcoholic steatohepatitis (NASH): Secondary | ICD-10-CM | POA: Diagnosis present

## 2023-06-14 DIAGNOSIS — R935 Abnormal findings on diagnostic imaging of other abdominal regions, including retroperitoneum: Secondary | ICD-10-CM | POA: Diagnosis not present

## 2023-06-14 DIAGNOSIS — I2510 Atherosclerotic heart disease of native coronary artery without angina pectoris: Secondary | ICD-10-CM | POA: Diagnosis not present

## 2023-06-14 DIAGNOSIS — E1169 Type 2 diabetes mellitus with other specified complication: Secondary | ICD-10-CM | POA: Diagnosis present

## 2023-06-14 DIAGNOSIS — I10 Essential (primary) hypertension: Secondary | ICD-10-CM | POA: Diagnosis not present

## 2023-06-14 DIAGNOSIS — Z7951 Long term (current) use of inhaled steroids: Secondary | ICD-10-CM

## 2023-06-14 DIAGNOSIS — I517 Cardiomegaly: Secondary | ICD-10-CM | POA: Diagnosis not present

## 2023-06-14 DIAGNOSIS — E66813 Obesity, class 3: Secondary | ICD-10-CM | POA: Diagnosis present

## 2023-06-14 DIAGNOSIS — C9000 Multiple myeloma not having achieved remission: Secondary | ICD-10-CM | POA: Diagnosis present

## 2023-06-14 DIAGNOSIS — J449 Chronic obstructive pulmonary disease, unspecified: Secondary | ICD-10-CM | POA: Diagnosis not present

## 2023-06-14 DIAGNOSIS — Z85828 Personal history of other malignant neoplasm of skin: Secondary | ICD-10-CM | POA: Diagnosis not present

## 2023-06-14 DIAGNOSIS — Z860100 Personal history of colon polyps, unspecified: Secondary | ICD-10-CM | POA: Diagnosis not present

## 2023-06-14 DIAGNOSIS — J441 Chronic obstructive pulmonary disease with (acute) exacerbation: Secondary | ICD-10-CM | POA: Diagnosis present

## 2023-06-14 DIAGNOSIS — I5032 Chronic diastolic (congestive) heart failure: Secondary | ICD-10-CM | POA: Diagnosis present

## 2023-06-14 DIAGNOSIS — Z79899 Other long term (current) drug therapy: Secondary | ICD-10-CM

## 2023-06-14 DIAGNOSIS — Z9981 Dependence on supplemental oxygen: Secondary | ICD-10-CM

## 2023-06-14 DIAGNOSIS — Z7985 Long-term (current) use of injectable non-insulin antidiabetic drugs: Secondary | ICD-10-CM

## 2023-06-14 DIAGNOSIS — M4854XA Collapsed vertebra, not elsewhere classified, thoracic region, initial encounter for fracture: Secondary | ICD-10-CM | POA: Diagnosis present

## 2023-06-14 DIAGNOSIS — G894 Chronic pain syndrome: Secondary | ICD-10-CM | POA: Diagnosis present

## 2023-06-14 DIAGNOSIS — Z87891 Personal history of nicotine dependence: Secondary | ICD-10-CM

## 2023-06-14 DIAGNOSIS — Z881 Allergy status to other antibiotic agents status: Secondary | ICD-10-CM

## 2023-06-14 DIAGNOSIS — Z66 Do not resuscitate: Secondary | ICD-10-CM | POA: Diagnosis present

## 2023-06-14 DIAGNOSIS — D696 Thrombocytopenia, unspecified: Secondary | ICD-10-CM | POA: Diagnosis present

## 2023-06-14 DIAGNOSIS — J9811 Atelectasis: Principal | ICD-10-CM | POA: Diagnosis present

## 2023-06-14 DIAGNOSIS — K219 Gastro-esophageal reflux disease without esophagitis: Secondary | ICD-10-CM | POA: Diagnosis present

## 2023-06-14 DIAGNOSIS — J90 Pleural effusion, not elsewhere classified: Secondary | ICD-10-CM | POA: Diagnosis not present

## 2023-06-14 DIAGNOSIS — D3502 Benign neoplasm of left adrenal gland: Secondary | ICD-10-CM | POA: Diagnosis present

## 2023-06-14 DIAGNOSIS — R0602 Shortness of breath: Secondary | ICD-10-CM | POA: Diagnosis not present

## 2023-06-14 DIAGNOSIS — J9611 Chronic respiratory failure with hypoxia: Secondary | ICD-10-CM | POA: Diagnosis present

## 2023-06-14 DIAGNOSIS — J9621 Acute and chronic respiratory failure with hypoxia: Secondary | ICD-10-CM | POA: Diagnosis present

## 2023-06-14 DIAGNOSIS — R0609 Other forms of dyspnea: Secondary | ICD-10-CM | POA: Diagnosis present

## 2023-06-14 DIAGNOSIS — R1902 Left upper quadrant abdominal swelling, mass and lump: Secondary | ICD-10-CM | POA: Diagnosis not present

## 2023-06-14 DIAGNOSIS — K7460 Unspecified cirrhosis of liver: Secondary | ICD-10-CM | POA: Diagnosis present

## 2023-06-14 DIAGNOSIS — R062 Wheezing: Secondary | ICD-10-CM

## 2023-06-14 DIAGNOSIS — K7469 Other cirrhosis of liver: Secondary | ICD-10-CM | POA: Diagnosis not present

## 2023-06-14 DIAGNOSIS — Z6841 Body Mass Index (BMI) 40.0 and over, adult: Secondary | ICD-10-CM | POA: Diagnosis not present

## 2023-06-14 DIAGNOSIS — Z8249 Family history of ischemic heart disease and other diseases of the circulatory system: Secondary | ICD-10-CM | POA: Diagnosis not present

## 2023-06-14 DIAGNOSIS — I110 Hypertensive heart disease with heart failure: Secondary | ICD-10-CM | POA: Diagnosis present

## 2023-06-14 DIAGNOSIS — Z832 Family history of diseases of the blood and blood-forming organs and certain disorders involving the immune mechanism: Secondary | ICD-10-CM

## 2023-06-14 DIAGNOSIS — Z833 Family history of diabetes mellitus: Secondary | ICD-10-CM

## 2023-06-14 DIAGNOSIS — Z800 Family history of malignant neoplasm of digestive organs: Secondary | ICD-10-CM

## 2023-06-14 NOTE — Transitions of Care (Post Inpatient/ED Visit) (Signed)
   06/14/2023  Name: Savannah Burns MRN: [id number] DOB: 1951/03/17  Today's TOC FU Call Status: Today's TOC FU Call Status:: Unsuccessful Call (1st Attempt) Unsuccessful Call (1st Attempt) Date: 06/14/23  Attempted to reach the patient regarding the most recent Inpatient/ED visit.  Follow Up Plan: Additional outreach attempts will be made to reach the patient to complete the Transitions of Care (Post Inpatient/ED visit) call.   Irving Shows [facility], BSN RN Care Manager/ Transition of Care [place] [phone]

## 2023-06-14 NOTE — ED Notes (Addendum)
 Pt endorses that she feels SOB when the pain increases. Pt endorses that she is having trouble and needing increase O2 at home when ambulating.

## 2023-06-14 NOTE — ED Provider Notes (Signed)
 [place] EMERGENCY DEPARTMENT AT [facility] Provider Note   CSN: [id number] Arrival date & time: 06/14/23  2311     History  Chief Complaint  Patient presents with   Shortness of Breath    RSV+    Savannah Burns is a 73 y.o. female.  The history is provided by the patient and a relative.  Shortness of Breath She has history of hypertension, diabetes, hyperlipidemia, COPD, diastolic heart failure, liver cirrhosis secondary to NASH, multiple myeloma and was charged from the hospital yesterday following admission for hypoxia and RSV infection and comes in complaining of increased oxygen requirement pain in her posterior chest.  Pain is worse with movement and taking deep breath.  She has taken half of an oxycodone-acetaminophen 10-325, which only gave partial relief of pain.  She states her coughing has improved compared with when she was in the hospital.  She denies running fevers.  Of note, she did not get her discharge medications filled until today, and also states that she had run out of her oxycodone-acetaminophen and the refill was not available today.   Home Medications Prior to Admission medications   Medication Sig Start Date End Date Taking? Authorizing Provider  albuterol (PROVENTIL) (2.5 MG/3ML) 0.083% nebulizer solution Take 3 mLs (2.5 mg total) by nebulization every 6 (six) hours as needed for wheezing or shortness of breath. 05/15/23   Raliegh Ip, DO  albuterol (VENTOLIN HFA) 108 (90 Base) MCG/ACT inhaler Inhale 2 puffs into the lungs every 6 (six) hours as needed for wheezing or shortness of breath. 05/04/23   Junie Spencer, FNP  carvedilol (COREG) 6.25 MG tablet Take 1 tablet (6.25 mg total) by mouth 2 (two) times daily with a meal. 06/12/23 07/12/23  Shahmehdi, Gemma Payor, MD  desvenlafaxine (PRISTIQ) 100 MG 24 hr tablet Take 100 mg by mouth in the morning. 01/26/23   [provider]  esomeprazole (NEXIUM) 40 MG capsule Take 1 capsule (40 mg  total) by mouth daily. 06/12/23   Kendell Bane, MD  ferrous sulfate 325 (65 FE) MG tablet Take 1 tablet (325 mg total) by mouth daily with breakfast. 06/12/23 10/10/23  Shahmehdi, Gemma Payor, MD  fluticasone-salmeterol (ADVAIR) 100-50 MCG/ACT AEPB Inhale 1 puff into the lungs 2 (two) times daily. 03/07/23   [provider]  furosemide (LASIX) 40 MG tablet Take 1 tablet (40 mg total) by mouth daily. 06/12/23   Shahmehdi, Gemma Payor, MD  lenalidomide (REVLIMID) 15 MG capsule TAKE 1 CAPSULE BY MOUTH EVERY DAY FOR 21 DAYS ON THEN 7 DAYS OFF 06/06/23   Doreatha Massed, MD  lidocaine-prilocaine (EMLA) cream Apply 1 Application topically as needed. Apply a small amount to port a cath site and cover with plastic wrap 1 hour prior to infusion appointments 04/12/23   Pappayliou, Santina Evans A, DO  lovastatin (MEVACOR) 20 MG tablet Take 1 tablet (20 mg total) by mouth at bedtime. 06/12/23   Kendell Bane, MD  methylPREDNISolone (MEDROL DOSEPAK) 4 MG TBPK tablet Medrol Dosepak take as instructed 06/12/23   Nevin Bloodgood A, MD  nystatin (MYCOSTATIN/NYSTOP) powder Apply topically 2 (two) times daily. Patient taking differently: Apply 1 Application topically 2 (two) times daily as needed (Yeast). 01/30/23   Dettinger, Elige Radon, MD  oxyCODONE-acetaminophen (PERCOCET) 10-325 MG tablet Take 1 tablet by mouth every 6 (six) hours as needed for pain.    [provider]  OXYGEN Inhale 3 L into the lungs continuous.    [provider]  OZEMPIC, 0.25 OR 0.5 MG/DOSE, 2 MG/3ML SOPN Inject 0.25 mg into the skin every 7 (seven) days. 12/02/22   Raliegh Ip, DO  potassium chloride SA (KLOR-CON M) 20 MEQ tablet Take 1 tablet (20 mEq total) by mouth daily. 06/14/23 07/14/23  Kendell Bane, MD      Allergies    Keflex [cephalexin]    Review of Systems   Review of Systems  Respiratory:  Positive for shortness of breath.   All other systems reviewed and are negative.   Physical  Exam Updated Vital Signs BP 129/61   Pulse 81   Temp 99.5 F (37.5 C) (Oral)   Resp (!) 23   SpO2 96%  Physical Exam Vitals and nursing note reviewed.   73 year old female, resting comfortably and in no acute distress. Vital signs are normal. Oxygen saturation is 97%, which is normal (on arrival she was hypoxic with oxygen saturation of 88%, but that was on room air and she has been using oxygen 5 L/min at home). Head is normocephalic and atraumatic. PERRLA, EOMI. Oropharynx is clear. Neck is nontender and supple without adenopathy or JVD. Back is nontender and there is no CVA tenderness. Lungs have diminished airflow with faint expiratory wheezing noted. Chest is nontender. Heart has regular rate and rhythm without murmur. Abdomen is soft, flat, nontender. Extremities have 2+ edema, which patient states is decreased from how it was recently. Skin is warm and dry without rash. Neurologic: Mental status is normal, cranial nerves are intact, moves all extremities equally.  ED Results / Procedures / Treatments   Labs (all labs ordered are listed, but only abnormal results are displayed) Labs Reviewed  CBC WITH DIFFERENTIAL/PLATELET - Abnormal; Notable for the following components:      Result Value   RBC 3.80 (*)    MCV 101.3 (*)    Platelets 77 (*)    All other components within normal limits  BASIC METABOLIC PANEL - Abnormal; Notable for the following components:   Chloride 95 (*)    Glucose, Bld 201 (*)    Calcium 8.8 (*)    All other components within normal limits  BRAIN NATRIURETIC PEPTIDE  HEMOGLOBIN A1C  CBC  CREATININE, SERUM  MAGNESIUM  PHOSPHORUS  TROPONIN I (HIGH SENSITIVITY)    EKG EKG Interpretation Date/Time:  Wednesday June 14 2023 23:51:21 EDT Ventricular Rate:  80 PR Interval:  134 QRS Duration:  146 QT Interval:  406 QTC Calculation: 469 R Axis:   58  Text Interpretation: Sinus rhythm Right bundle branch block When compared with ECG of  06/09/2023, HEART RATE has decreased Confirmed by Dione Booze ([id number]) on 06/14/2023 11:58:38 PM  Radiology CT Angio Chest PE W and/or Wo Contrast Result Date: 06/15/2023 CLINICAL DATA:  Back pain with movement. EXAM: CT ANGIOGRAPHY CHEST WITH CONTRAST TECHNIQUE: Multidetector CT imaging of the chest was performed using the standard protocol during bolus administration of intravenous contrast. Multiplanar CT image reconstructions and MIPs were obtained to evaluate the vascular anatomy. RADIATION DOSE REDUCTION: This exam was performed according to the departmental dose-optimization program which includes automated exposure control, adjustment of the mA and/or kV according to patient size and/or use of iterative reconstruction technique. CONTRAST:  75mL OMNIPAQUE IOHEXOL 350 MG/ML SOLN COMPARISON:  September 26, 2022 FINDINGS: Cardiovascular: A right-sided venous Port-A-Cath is seen. There is marked severity calcification of the aortic arch, without evidence of aortic aneurysm. Satisfactory opacification of the pulmonary arteries to the segmental level. No evidence of pulmonary embolism.  There is mild cardiomegaly with moderate severity coronary artery calcification. No pericardial effusion. Mediastinum/Nodes: No enlarged mediastinal, hilar, or axillary lymph nodes. Thyroid gland, trachea, and esophagus demonstrate no significant findings. Lungs/Pleura: The right middle lobe and left lower lobe are completely collapsed. Mild to moderate severity lingular atelectasis is seen. No pleural effusion or pneumothorax is identified. Upper Abdomen: There is a stable 3.2 cm low-attenuation left adrenal mass (approximately 49.03 Hounsfield units). Musculoskeletal: Compression fracture deformities of indeterminate age are seen at the levels of T6 and T8. This represents a new finding when compared to the prior study. Multilevel degenerative changes are seen throughout the thoracic spine. Review of the MIP images confirms the above  findings. IMPRESSION: 1. No evidence of pulmonary embolism. 2. Complete collapse of the right middle lobe and left lower lobe. 3. Mild to moderate severity lingular atelectasis. 4. 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 5. Compression fracture deformities of indeterminate age at the levels of T6 and T8. 6. Aortic atherosclerosis. Aortic Atherosclerosis (ICD10-I70.0). Electronically Signed   By: Aram Candela M.D.   On: 06/15/2023 02:04    Procedures Procedures  Cardiac monitor shows normal sinus rhythm, per my interpretation.  Medications Ordered in ED Medications  oxyCODONE-acetaminophen (PERCOCET) 10-325 MG per tablet 1 tablet (has no administration in time range)  doxycycline (VIBRA-TABS) tablet 100 mg (has no administration in time range)  carvedilol (COREG) tablet 6.25 mg (has no administration in time range)  furosemide (LASIX) tablet 60 mg (has no administration in time range)  pravastatin (PRAVACHOL) tablet 20 mg (has no administration in time range)  pantoprazole (PROTONIX) EC tablet 40 mg (has no administration in time range)  ferrous sulfate tablet 325 mg (has no administration in time range)  potassium chloride SA (KLOR-CON M) CR tablet 20 mEq (has no administration in time range)  budesonide (PULMICORT) nebulizer solution 0.5 mg (has no administration in time range)  arformoterol (BROVANA) nebulizer solution 15 mcg (has no administration in time range)  dextromethorphan-guaiFENesin (MUCINEX DM) 30-600 MG per 12 hr tablet 1 tablet (has no administration in time range)  ipratropium-albuterol (DUONEB) 0.5-2.5 (3) MG/3ML nebulizer solution 3 mL (has no administration in time range)  nystatin (MYCOSTATIN/NYSTOP) topical powder (has no administration in time range)  predniSONE (DELTASONE) tablet 60 mg (has no administration in time range)  insulin glargine-yfgn (SEMGLEE) injection  12 Units (has no administration in time range)  insulin aspart (novoLOG) injection 0-15 Units (has no administration in time range)  insulin aspart (novoLOG) injection 0-5 Units (has no administration in time range)  enoxaparin (LOVENOX) injection 40 mg (has no administration in time range)  sodium chloride flush (NS) 0.9 % injection 3 mL (has no administration in time range)  sodium chloride flush (NS) 0.9 % injection 3 mL (has no administration in time range)  0.9 %  sodium chloride infusion (has no administration in time range)  acetaminophen (TYLENOL) tablet 650 mg (has no administration in time range)    Or  acetaminophen (TYLENOL) suppository 650 mg (has no administration in time range)  ondansetron (ZOFRAN) tablet 4 mg (has no administration in time range)    Or  ondansetron (ZOFRAN) injection 4 mg (has no administration in time range)  loratadine (CLARITIN) tablet 10 mg (has no administration in time range)  ipratropium-albuterol (DUONEB) 0.5-2.5 (3) MG/3ML nebulizer solution 3 mL (3 mLs Nebulization Given 06/15/23 0101)  methylPREDNISolone sodium succinate (SOLU-MEDROL) 125 mg/2 mL injection  125 mg (125 mg Intravenous Given 06/15/23 0050)  morphine (PF) 4 MG/ML injection 4 mg (4 mg Intravenous Given 06/15/23 0056)  iohexol (OMNIPAQUE) 350 MG/ML injection 75 mL (75 mLs Intravenous Contrast Given 06/15/23 0135)    ED Course/ Medical Decision Making/ A&P                                 Medical Decision Making Amount and/or Complexity of Data Reviewed Labs: ordered. Radiology: ordered.  Risk Prescription drug management. Decision regarding hospitalization.   Posterior chest pain and increased oxygen requirement and patient with known history of COPD and recent RSV infection.  I am concerned about possibility of pulmonary embolism as well as superimposed pneumonia.  I have reviewed her electrocardiogram, my interpretation is right bundle branch block with rate significantly slower  than recent ECG.  I have ordered screening labs of CBC, basic metabolic panel, troponin x 1, BNP.  I have ordered CT angiogram of the chest.  I am also ordering a dose of morphine for pain, methylprednisolone, nebulizer treatment with albuterol and ipratropium.  I reviewed her past records and do note hospitalization 06/09/2023-06/13/2023 for hypoxic respiratory failure in setting of COPD and RSV infection.  I have reviewed her laboratory tests, and my interpretation is normal WBC, mild macrocytosis, normal BNP, normal troponin, mildly elevated glucose consistent with known history of diabetes.  CT angiogram shows no evidence of pulmonary embolism, but shows complete collapse of the right middle lobe and left lower lobe as well as mild to moderate lingular atelectasis.  This would account for her worsening respiratory status.  She will need aggressive pulmonary toilet and is not safe for discharge.  I have discussed case with Dr. Thomes Dinning of [facility], who agrees to admit the patient.  Final Clinical Impression(s) / ED Diagnoses Final diagnoses:  Acute and chronic respiratory failure with hypoxia (HCC)  Atelectasis of both lungs    Rx / DC Orders ED Discharge Orders     None         Dione Booze, MD 06/15/23 [phone]

## 2023-06-14 NOTE — Consult Note (Signed)
 [facility] Liaison Note  06/14/2023  RAVIN BENDALL 1950-06-15 [id number]  Location: RN Hospital Liaison screened the patient remotely at [facility].  Insurance: [facility]   [place] MONEKA MCQUINN is a 73 y.o. female who is a Optician, dispensing Care Patient of Raliegh Ip, DO [facility]. The patient was screened for  readmission hospitalization with noted extreme risk score for unplanned readmission risk with 1 IP/1 ED in 6 months.  The patient was assessed for potential Care Management service needs for post hospital transition for care coordination. Review of patient's electronic medical record reveals patient was admitted chronic respiratory failure with hypoxia. Pt discharged home with self care. Oncology team is following pt closely.  Plan: [facility] Liaison will continue to follow progress and disposition to asess for post hospital community care coordination/management needs.  Referral request for community care coordination: anticipate Transitions of Care Team follow up.   [facility] Care Management/Population Health does not replace or interfere with any arrangements made by the Inpatient Transition of Care team.   For questions contact:   Elliot Cousin, RN, BSN Hospital Liaison [facility]   [name], Population Health Office Hours MTWF  8:00 am-6:00 pm Direct Dial: [phone] mobile [email or web address]

## 2023-06-14 NOTE — ED Triage Notes (Signed)
 Pov from home cc of sob. Was just dc yesterday for RSV. On 3l all the time. Also says her back hurts with movement

## 2023-06-14 NOTE — ED Provider Notes (Incomplete)
  EMERGENCY DEPARTMENT AT [facility] Provider Note   CSN: [id number] Arrival date & time: 06/14/23  2311     History {Add pertinent medical, surgical, social history, OB history to HPI:1} Chief Complaint  Patient presents with  . Shortness of Breath    RSV+    Savannah Burns is a 73 y.o. female.   Shortness of Breath She has history of hypertension, diabetes, hyperlipidemia, COPD, diastolic heart failure, liver cirrhosis secondary to NASH, multiple myeloma and was charged from the hospital yesterday following admission for hypoxia   Home Medications Prior to Admission medications   Medication Sig Start Date End Date Taking? Authorizing Provider  albuterol (PROVENTIL) (2.5 MG/3ML) 0.083% nebulizer solution Take 3 mLs (2.5 mg total) by nebulization every 6 (six) hours as needed for wheezing or shortness of breath. 05/15/23   Raliegh Ip, DO  albuterol (VENTOLIN HFA) 108 (90 Base) MCG/ACT inhaler Inhale 2 puffs into the lungs every 6 (six) hours as needed for wheezing or shortness of breath. 05/04/23   Junie Spencer, FNP  carvedilol (COREG) 6.25 MG tablet Take 1 tablet (6.25 mg total) by mouth 2 (two) times daily with a meal. 06/12/23 07/12/23  Shahmehdi, Gemma Payor, MD  clindamycin (CLEOCIN T) 1 % SWAB Apply to affected areas under axilla twice daily 12/02/22   Delynn Flavin M, DO  desvenlafaxine (PRISTIQ) 100 MG 24 hr tablet Take 100 mg by mouth in the morning. 01/26/23   [provider]  doxycycline (VIBRA-TABS) 100 MG tablet Take 1 tablet (100 mg total) by mouth every 12 (twelve) hours for 2 days. 06/12/23 06/14/23  Kendell Bane, MD  esomeprazole (NEXIUM) 40 MG capsule Take 1 capsule (40 mg total) by mouth daily. 06/12/23   Kendell Bane, MD  ferrous sulfate 325 (65 FE) MG tablet Take 1 tablet (325 mg total) by mouth daily with breakfast. 06/12/23 10/10/23  Shahmehdi, Gemma Payor, MD  fluticasone-salmeterol (ADVAIR) 100-50 MCG/ACT AEPB Inhale  1 puff into the lungs 2 (two) times daily. 03/07/23   [provider]  furosemide (LASIX) 40 MG tablet Take 1 tablet (40 mg total) by mouth daily. 06/12/23   Shahmehdi, Gemma Payor, MD  guaiFENesin-dextromethorphan (ROBITUSSIN DM) 100-10 MG/5ML syrup Take 10 mLs by mouth every 8 (eight) hours. 06/13/23   Shahmehdi, Gemma Payor, MD  lenalidomide (REVLIMID) 15 MG capsule TAKE 1 CAPSULE BY MOUTH EVERY DAY FOR 21 DAYS ON THEN 7 DAYS OFF 06/06/23   Doreatha Massed, MD  lidocaine-prilocaine (EMLA) cream Apply 1 Application topically as needed. Apply a small amount to port a cath site and cover with plastic wrap 1 hour prior to infusion appointments 04/12/23   Pappayliou, Santina Evans A, DO  lovastatin (MEVACOR) 20 MG tablet Take 1 tablet (20 mg total) by mouth at bedtime. 06/12/23   Kendell Bane, MD  methylPREDNISolone (MEDROL DOSEPAK) 4 MG TBPK tablet Medrol Dosepak take as instructed 06/12/23   Nevin Bloodgood A, MD  nystatin (MYCOSTATIN/NYSTOP) powder Apply topically 2 (two) times daily. Patient taking differently: Apply 1 Application topically 2 (two) times daily as needed (Yeast). 01/30/23   Dettinger, Elige Radon, MD  oxyCODONE-acetaminophen (PERCOCET) 10-325 MG tablet Take 1 tablet by mouth every 6 (six) hours as needed for pain.    [provider]  OXYGEN Inhale 3 L into the lungs continuous.    [provider]  OZEMPIC, 0.25 OR 0.5 MG/DOSE, 2 MG/3ML SOPN Inject 0.25 mg into the skin every 7 (seven) days. 12/02/22  Delynn Flavin M, DO  potassium chloride SA (KLOR-CON M) 20 MEQ tablet Take 1 tablet (20 mEq total) by mouth daily. 06/14/23 07/14/23  Kendell Bane, MD      Allergies    Keflex [cephalexin]    Review of Systems   Review of Systems  Respiratory:  Positive for shortness of breath.     Physical Exam Updated Vital Signs BP 133/68   Pulse 84   Temp 99 F (37.2 C) (Oral)   Resp (!) 22   SpO2 (!) 88%  Physical Exam  ED Results / Procedures / Treatments    Labs (all labs ordered are listed, but only abnormal results are displayed) Labs Reviewed - No data to display  EKG None  Radiology No results found.  Procedures Procedures  {Document cardiac monitor, telemetry assessment procedure when appropriate:1}  Medications Ordered in ED Medications - No data to display  ED Course/ Medical Decision Making/ A&P   {   Click here for ABCD2, HEART and other calculatorsREFRESH Note before signing :1}                              Medical Decision Making  ***  {Document critical care time when appropriate:1} {Document review of labs and clinical decision tools ie heart score, Chads2Vasc2 etc:1}  {Document your independent review of radiology images, and any outside records:1} {Document your discussion with family members, caretakers, and with consultants:1} {Document social determinants of health affecting pt's care:1} {Document your decision making why or why not admission, treatments were needed:1} Final Clinical Impression(s) / ED Diagnoses Final diagnoses:  None    Rx / DC Orders ED Discharge Orders     None

## 2023-06-15 ENCOUNTER — Telehealth: Payer: Self-pay | Admitting: *Deleted

## 2023-06-15 ENCOUNTER — Emergency Department (HOSPITAL_COMMUNITY)

## 2023-06-15 DIAGNOSIS — E785 Hyperlipidemia, unspecified: Secondary | ICD-10-CM

## 2023-06-15 DIAGNOSIS — K7469 Other cirrhosis of liver: Secondary | ICD-10-CM

## 2023-06-15 DIAGNOSIS — I5032 Chronic diastolic (congestive) heart failure: Secondary | ICD-10-CM | POA: Diagnosis not present

## 2023-06-15 DIAGNOSIS — K219 Gastro-esophageal reflux disease without esophagitis: Secondary | ICD-10-CM

## 2023-06-15 DIAGNOSIS — J9621 Acute and chronic respiratory failure with hypoxia: Secondary | ICD-10-CM

## 2023-06-15 DIAGNOSIS — J449 Chronic obstructive pulmonary disease, unspecified: Secondary | ICD-10-CM | POA: Diagnosis not present

## 2023-06-15 DIAGNOSIS — I10 Essential (primary) hypertension: Secondary | ICD-10-CM

## 2023-06-15 DIAGNOSIS — D696 Thrombocytopenia, unspecified: Secondary | ICD-10-CM | POA: Diagnosis not present

## 2023-06-15 DIAGNOSIS — E1169 Type 2 diabetes mellitus with other specified complication: Secondary | ICD-10-CM | POA: Diagnosis not present

## 2023-06-15 LAB — CREATININE, SERUM
Creatinine, Ser: 0.68 mg/dL (ref 0.44–1.00)
GFR, Estimated: >60 mL/min (ref >60)

## 2023-06-15 LAB — BASIC METABOLIC PANEL
Anion gap: 12 (ref 5–15)
BUN: 22 mg/dL (ref 8–23)
CO2: 31 mmol/L (ref 22–32)
Calcium: 8.8 mg/dL — ABNORMAL LOW (ref 8.9–10.3)
Chloride: 95 mmol/L — ABNORMAL LOW (ref 98–111)
Creatinine, Ser: 0.7 mg/dL (ref 0.44–1.00)
GFR, Estimated: >60 mL/min (ref >60)
Glucose, Bld: 201 mg/dL — ABNORMAL HIGH (ref 70–99)
Potassium: 3.5 mmol/L (ref 3.5–5.1)
Sodium: 138 mmol/L (ref 135–145)

## 2023-06-15 LAB — CBC WITH DIFFERENTIAL/PLATELET
Abs Immature Granulocytes: 0.02 10*3/uL (ref 0.00–0.07)
Basophils Absolute: 0 10*3/uL (ref 0.0–0.1)
Basophils Relative: 0 %
Eosinophils Absolute: 0.1 10*3/uL (ref 0.0–0.5)
Eosinophils Relative: 1 %
HCT: 38.5 % (ref 36.0–46.0)
Hemoglobin: 12.5 g/dL (ref 12.0–15.0)
Immature Granulocytes: 1 %
Lymphocytes Relative: 27 %
Lymphs Abs: 1.2 10*3/uL (ref 0.7–4.0)
MCH: 32.9 pg (ref 26.0–34.0)
MCHC: 32.5 g/dL (ref 30.0–36.0)
MCV: 101.3 fL — ABNORMAL HIGH (ref 80.0–100.0)
Monocytes Absolute: 0.6 10*3/uL (ref 0.1–1.0)
Monocytes Relative: 13 %
Neutro Abs: 2.5 10*3/uL (ref 1.7–7.7)
Neutrophils Relative %: 58 %
Platelets: 77 10*3/uL — ABNORMAL LOW (ref 150–400)
RBC: 3.8 MIL/uL — ABNORMAL LOW (ref 3.87–5.11)
RDW: 13.6 % (ref 11.5–15.5)
WBC: 4.4 10*3/uL (ref 4.0–10.5)
nRBC: 0 % (ref 0.0–0.2)

## 2023-06-15 LAB — CBC
HCT: 39.6 % (ref 36.0–46.0)
Hemoglobin: 12.6 g/dL (ref 12.0–15.0)
MCH: 32.5 pg (ref 26.0–34.0)
MCHC: 31.8 g/dL (ref 30.0–36.0)
MCV: 102.1 fL — ABNORMAL HIGH (ref 80.0–100.0)
Platelets: 76 10*3/uL — ABNORMAL LOW (ref 150–400)
RBC: 3.88 MIL/uL (ref 3.87–5.11)
RDW: 13.5 % (ref 11.5–15.5)
WBC: 3.1 10*3/uL — ABNORMAL LOW (ref 4.0–10.5)
nRBC: 0 % (ref 0.0–0.2)

## 2023-06-15 LAB — BRAIN NATRIURETIC PEPTIDE: B Natriuretic Peptide: 44 pg/mL (ref 0.0–100.0)

## 2023-06-15 LAB — CBG MONITORING, ED
Glucose-Capillary: 267 mg/dL — ABNORMAL HIGH (ref 70–99)
Glucose-Capillary: 312 mg/dL — ABNORMAL HIGH (ref 70–99)

## 2023-06-15 LAB — GLUCOSE, CAPILLARY
Glucose-Capillary: 202 mg/dL — ABNORMAL HIGH (ref 70–99)
Glucose-Capillary: 296 mg/dL — ABNORMAL HIGH (ref 70–99)

## 2023-06-15 LAB — PHOSPHORUS: Phosphorus: 3.9 mg/dL (ref 2.5–4.6)

## 2023-06-15 LAB — HEMOGLOBIN A1C
Hgb A1c MFr Bld: 6.7 % — ABNORMAL HIGH (ref 4.8–5.6)
Mean Plasma Glucose: 145.59 mg/dL

## 2023-06-15 LAB — MAGNESIUM: Magnesium: 2.3 mg/dL (ref 1.7–2.4)

## 2023-06-15 LAB — TROPONIN I (HIGH SENSITIVITY): Troponin I (High Sensitivity): 10 ng/L (ref <18)

## 2023-06-15 MED ORDER — ONDANSETRON HCL 4 MG PO TABS: 4.0000 mg | ORAL_TABLET | Freq: Four times a day (QID) | ORAL | Status: DC | PRN

## 2023-06-15 MED ORDER — IPRATROPIUM-ALBUTEROL 0.5-2.5 (3) MG/3ML IN SOLN
3.0000 mL | Freq: Once | RESPIRATORY_TRACT | Status: AC
  Administered 2023-06-15: 3 mL via RESPIRATORY_TRACT
  Filled 2023-06-15: qty 3

## 2023-06-15 MED ORDER — SODIUM CHLORIDE 0.9% FLUSH
3.0000 mL | INTRAVENOUS | Status: DC | PRN
  Administered 2023-06-15: 3 mL via INTRAVENOUS

## 2023-06-15 MED ORDER — INSULIN GLARGINE-YFGN 100 UNIT/ML ~~LOC~~ SOLN
12.0000 [IU] | Freq: Every day | SUBCUTANEOUS | Status: DC
  Administered 2023-06-15 – 2023-06-19 (×5): 12 [IU] via SUBCUTANEOUS
  Filled 2023-06-15 (×6): qty 0.12

## 2023-06-15 MED ORDER — SODIUM CHLORIDE 0.9 % IV SOLN: 250.0000 mL | INTRAVENOUS | Status: AC | PRN

## 2023-06-15 MED ORDER — INSULIN ASPART 100 UNIT/ML IJ SOLN
0.0000 [IU] | Freq: Three times a day (TID) | INTRAMUSCULAR | Status: DC
  Administered 2023-06-15: 8 [IU] via SUBCUTANEOUS
  Administered 2023-06-15: 5 [IU] via SUBCUTANEOUS
  Administered 2023-06-15: 11 [IU] via SUBCUTANEOUS
  Administered 2023-06-16 (×2): 3 [IU] via SUBCUTANEOUS
  Administered 2023-06-16: 11 [IU] via SUBCUTANEOUS
  Administered 2023-06-17: 8 [IU] via SUBCUTANEOUS
  Administered 2023-06-17: 2 [IU] via SUBCUTANEOUS
  Administered 2023-06-17: 11 [IU] via SUBCUTANEOUS
  Administered 2023-06-18: 5 [IU] via SUBCUTANEOUS
  Administered 2023-06-18: 2 [IU] via SUBCUTANEOUS
  Administered 2023-06-18: 8 [IU] via SUBCUTANEOUS
  Administered 2023-06-19: 3 [IU] via SUBCUTANEOUS

## 2023-06-15 MED ORDER — DM-GUAIFENESIN ER 30-600 MG PO TB12
1.0000 | ORAL_TABLET | Freq: Two times a day (BID) | ORAL | Status: DC
  Administered 2023-06-15 – 2023-06-19 (×9): 1 via ORAL
  Filled 2023-06-15 (×9): qty 1

## 2023-06-15 MED ORDER — ACETAMINOPHEN 325 MG PO TABS: 650.0000 mg | ORAL_TABLET | Freq: Four times a day (QID) | ORAL | Status: DC | PRN

## 2023-06-15 MED ORDER — PREDNISONE 20 MG PO TABS
60.0000 mg | ORAL_TABLET | Freq: Every day | ORAL | Status: DC
  Administered 2023-06-15 – 2023-06-19 (×5): 60 mg via ORAL
  Filled 2023-06-15 (×2): qty 3
  Filled 2023-06-15: qty 1
  Filled 2023-06-15 (×2): qty 3

## 2023-06-15 MED ORDER — SODIUM CHLORIDE 0.9% FLUSH
3.0000 mL | Freq: Two times a day (BID) | INTRAVENOUS | Status: DC
  Administered 2023-06-15 – 2023-06-19 (×8): 3 mL via INTRAVENOUS

## 2023-06-15 MED ORDER — OXYCODONE HCL 5 MG PO TABS
5.0000 mg | ORAL_TABLET | Freq: Four times a day (QID) | ORAL | Status: DC | PRN
  Administered 2023-06-15 – 2023-06-19 (×11): 5 mg via ORAL
  Filled 2023-06-15 (×11): qty 1

## 2023-06-15 MED ORDER — FERROUS SULFATE 325 (65 FE) MG PO TABS
325.0000 mg | ORAL_TABLET | Freq: Every day | ORAL | Status: DC
  Administered 2023-06-15 – 2023-06-19 (×5): 325 mg via ORAL
  Filled 2023-06-15 (×5): qty 1

## 2023-06-15 MED ORDER — ONDANSETRON HCL 4 MG/2ML IJ SOLN: 4.0000 mg | Freq: Four times a day (QID) | INTRAMUSCULAR | Status: DC | PRN

## 2023-06-15 MED ORDER — BUDESONIDE 0.5 MG/2ML IN SUSP
0.5000 mg | Freq: Two times a day (BID) | RESPIRATORY_TRACT | Status: DC
  Administered 2023-06-15 – 2023-06-19 (×9): 0.5 mg via RESPIRATORY_TRACT
  Filled 2023-06-15 (×9): qty 2

## 2023-06-15 MED ORDER — DOXYCYCLINE HYCLATE 100 MG PO TABS
100.0000 mg | ORAL_TABLET | Freq: Two times a day (BID) | ORAL | Status: AC
  Administered 2023-06-15 – 2023-06-17 (×6): 100 mg via ORAL
  Filled 2023-06-15 (×6): qty 1

## 2023-06-15 MED ORDER — PANTOPRAZOLE SODIUM 40 MG PO TBEC
40.0000 mg | DELAYED_RELEASE_TABLET | Freq: Two times a day (BID) | ORAL | Status: DC
  Administered 2023-06-15 – 2023-06-19 (×9): 40 mg via ORAL
  Filled 2023-06-15 (×9): qty 1

## 2023-06-15 MED ORDER — IOHEXOL 350 MG/ML SOLN
75.0000 mL | Freq: Once | INTRAVENOUS | Status: AC | PRN
  Administered 2023-06-15: 75 mL via INTRAVENOUS

## 2023-06-15 MED ORDER — OXYCODONE-ACETAMINOPHEN 5-325 MG PO TABS
1.0000 | ORAL_TABLET | Freq: Three times a day (TID) | ORAL | Status: DC | PRN
  Administered 2023-06-15 – 2023-06-17 (×5): 1 via ORAL
  Filled 2023-06-15 (×6): qty 1

## 2023-06-15 MED ORDER — OXYCODONE-ACETAMINOPHEN 10-325 MG PO TABS: 1.0000 | ORAL_TABLET | Freq: Three times a day (TID) | ORAL | Status: DC | PRN

## 2023-06-15 MED ORDER — INSULIN ASPART 100 UNIT/ML IJ SOLN
0.0000 [IU] | Freq: Every day | INTRAMUSCULAR | Status: DC
  Administered 2023-06-15: 3 [IU] via SUBCUTANEOUS
  Administered 2023-06-16: 2 [IU] via SUBCUTANEOUS
  Administered 2023-06-17 – 2023-06-18 (×2): 3 [IU] via SUBCUTANEOUS

## 2023-06-15 MED ORDER — POTASSIUM CHLORIDE CRYS ER 20 MEQ PO TBCR
20.0000 meq | EXTENDED_RELEASE_TABLET | Freq: Every day | ORAL | Status: DC
  Administered 2023-06-15 – 2023-06-19 (×5): 20 meq via ORAL
  Filled 2023-06-15 (×5): qty 1

## 2023-06-15 MED ORDER — NYSTATIN 100000 UNIT/GM EX POWD
Freq: Two times a day (BID) | CUTANEOUS | Status: DC
  Filled 2023-06-15 (×3): qty 15

## 2023-06-15 MED ORDER — MORPHINE SULFATE (PF) 4 MG/ML IV SOLN
4.0000 mg | Freq: Once | INTRAVENOUS | Status: AC
  Administered 2023-06-15: 4 mg via INTRAVENOUS
  Filled 2023-06-15: qty 1

## 2023-06-15 MED ORDER — ACETAMINOPHEN 650 MG RE SUPP
650.0000 mg | Freq: Four times a day (QID) | RECTAL | Status: DC | PRN
Start: 2023-06-15 — End: 2023-06-19

## 2023-06-15 MED ORDER — METHYLPREDNISOLONE SODIUM SUCC 125 MG IJ SOLR
125.0000 mg | Freq: Once | INTRAMUSCULAR | Status: AC
Start: 2023-06-15 — End: 2023-06-15
  Administered 2023-06-15: 125 mg via INTRAVENOUS
  Filled 2023-06-15: qty 2

## 2023-06-15 MED ORDER — CARVEDILOL 3.125 MG PO TABS
6.2500 mg | ORAL_TABLET | Freq: Two times a day (BID) | ORAL | Status: DC
  Administered 2023-06-15 – 2023-06-19 (×9): 6.25 mg via ORAL
  Filled 2023-06-15 (×9): qty 2

## 2023-06-15 MED ORDER — IPRATROPIUM-ALBUTEROL 0.5-2.5 (3) MG/3ML IN SOLN
3.0000 mL | Freq: Four times a day (QID) | RESPIRATORY_TRACT | Status: DC | PRN
Start: 2023-06-15 — End: 2023-06-19
  Administered 2023-06-15 – 2023-06-16 (×2): 3 mL via RESPIRATORY_TRACT
  Filled 2023-06-15 (×3): qty 3

## 2023-06-15 MED ORDER — ENOXAPARIN SODIUM 60 MG/0.6ML IJ SOSY: 60.0000 mg | PREFILLED_SYRINGE | INTRAMUSCULAR | Status: DC

## 2023-06-15 MED ORDER — FUROSEMIDE 40 MG PO TABS
60.0000 mg | ORAL_TABLET | Freq: Every day | ORAL | Status: DC
  Administered 2023-06-15 – 2023-06-19 (×5): 60 mg via ORAL
  Filled 2023-06-15: qty 2
  Filled 2023-06-15 (×4): qty 1

## 2023-06-15 MED ORDER — PRAVASTATIN SODIUM 40 MG PO TABS
20.0000 mg | ORAL_TABLET | Freq: Every day | ORAL | Status: DC
  Administered 2023-06-15 – 2023-06-18 (×4): 20 mg via ORAL
  Filled 2023-06-15 (×4): qty 1

## 2023-06-15 MED ORDER — LORATADINE 10 MG PO TABS
10.0000 mg | ORAL_TABLET | Freq: Every day | ORAL | Status: DC
  Administered 2023-06-15 – 2023-06-19 (×5): 10 mg via ORAL
  Filled 2023-06-15 (×5): qty 1

## 2023-06-15 MED ORDER — ARFORMOTEROL TARTRATE 15 MCG/2ML IN NEBU
15.0000 ug | INHALATION_SOLUTION | Freq: Two times a day (BID) | RESPIRATORY_TRACT | Status: DC
  Administered 2023-06-15 – 2023-06-19 (×9): 15 ug via RESPIRATORY_TRACT
  Filled 2023-06-15 (×9): qty 2

## 2023-06-15 NOTE — ED Notes (Signed)
 Pt is in CT

## 2023-06-15 NOTE — Care Management Obs Status (Signed)
 MEDICARE OBSERVATION STATUS NOTIFICATION   Patient Details  Name: Savannah Burns MRN: [id number] Date of Birth: 08/06/1950   Medicare Observation Status Notification Given:  Yes    Barron Alvine, RN 06/15/2023, 7:12 PM

## 2023-06-15 NOTE — H&P (Signed)
 History and Physical    Patient: Savannah Burns:[id number] DOB: December 26, 1950 DOA: 06/14/2023 DOS: the patient was seen and examined on 06/15/2023 PCP: Savannah Ip, DO  Patient coming from: Home  Chief Complaint:  Chief Complaint  Patient presents with   Shortness of Breath    RSV+   HPI: [name] is a 73 y.o. 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  Patient reports going home and being unable to get her medications right away by the time that she acquired in her respiratory status was so bad that she ended up coming back to the hospital for further evaluation and management.  Workup in the ED demonstrating atelectasis with complete collapse of right middle lobe and lingular atelectasis and very poor air movement.  Nebulizer management were provided and TRH contacted to place patient in the hospital for further evaluation and management.  Of note, patient expressed experiencing back discomfort especially with coughing spells and has ran out of her home Percocet medication.   Review of Systems: As mentioned in the history of present illness. All other systems reviewed and are negative.  Past Medical History:  Diagnosis Date   Adrenal adenoma, left    Stable   Anxiety    Arthritis    bilateral hands   COPD (chronic obstructive pulmonary disease) (HCC)    Depression    Diabetes mellitus, type 2 (HCC) 08/12/2008   Qualifier: Diagnosis of  By: Dayton Martes MD, Talia     Dyspnea    Esophageal varices (HCC)    Grade II diastolic dysfunction    History of kidney stones    Hyperlipidemia    Hypertension    Lower  back pain    Lower GI bleed 03/19/2020   Panic attacks    Pneumonia    currently taking antibiotic and prednisone for early stages of pneumonia   Pulmonary nodules    bilateral   Skin cancer    face   Past Surgical History:  Procedure Laterality Date   BIOPSY  04/07/2020   Procedure: BIOPSY;  Surgeon: Lanelle Bal, DO;  [facility];  Service: Endoscopy;;   Breast Cystectomy  Right    CESAREAN SECTION     COLONOSCOPY WITH PROPOFOL N/A 01/25/2020   Dr. Marletta Lor: Nonbleeding internal hemorrhoids, diverticulosis, 5 mm polyp removed from the ascending colon, 10 mm polyp removed from the sigmoid colon, 30 mm polyp (tubulovillous adenoma with no high-grade dysplasia) removed from the transverse colon via piecemeal status post tattoo.  Other polyps were tubular adenomas.  3 month surveillance colonoscopy recommended.   COLONOSCOPY WITH PROPOFOL N/A 04/07/2020   Procedure: COLONOSCOPY WITH PROPOFOL;  Surgeon: Lanelle Bal, DO;  [facility];  Service: Endoscopy;  Laterality: N/A;  3:00pm, pt knows new time per office   CYSTOSCOPY/URETEROSCOPY/HOLMIUM LASER/STENT PLACEMENT Bilateral 03/01/2019   Procedure: CYSTOSCOPY/RETROGRADEURETEROSCOPY/HOLMIUM LASER/STENT PLACEMENT;  Surgeon: Rene Paci, MD;  Location: [facility];  Service: Urology;  Laterality: Bilateral;  ONLY NEEDS 60 MIN   ESOPHAGOGASTRODUODENOSCOPY (EGD) WITH PROPOFOL N/A 01/25/2020   Dr. Marletta Lor: 4 columns grade 1 esophageal varices   ESOPHAGOGASTRODUODENOSCOPY (EGD) WITH PROPOFOL N/A 05/18/2020   Procedure: ESOPHAGOGASTRODUODENOSCOPY (EGD) WITH PROPOFOL;  Surgeon: Lanelle Bal, DO;  [facility];  Service: Endoscopy;  Laterality: N/A;   ESOPHAGOGASTRODUODENOSCOPY (EGD) WITH PROPOFOL N/A 07/28/2020   Procedure: ESOPHAGOGASTRODUODENOSCOPY (EGD) WITH PROPOFOL;  Surgeon: Lanelle Bal, DO;  [facility];  Service: Endoscopy;  Laterality: N/A;  am or early PM due to givens  capsule placement   GIVENS CAPSULE STUDY N/A 05/18/2020   Procedure: GIVENS CAPSULE STUDY;  Surgeon: Dolores Frame, MD;  [facility];  Service: Gastroenterology;  Laterality: N/A;   GIVENS CAPSULE STUDY N/A 07/28/2020   Procedure: GIVENS CAPSULE STUDY;  Surgeon: Lanelle Bal, DO;  [facility];  Service: Endoscopy;  Laterality: N/A;   IR IMAGING GUIDED PORT INSERTION  12/02/2021   IRRIGATION AND DEBRIDEMENT ABSCESS Right 09/28/2022   Procedure: IRRIGATION AND DEBRIDEMENT ABSCESS;  Surgeon: Lewie Chamber, DO;  Location: [facility];  Service: General;  Laterality: Right;   POLYPECTOMY  01/25/2020   Procedure: POLYPECTOMY;  Surgeon: Lanelle Bal, DO;  [facility];  Service: Endoscopy;;   POLYPECTOMY  04/07/2020   Procedure: POLYPECTOMY INTESTINAL;  Surgeon: Lanelle Bal, DO;  [facility];  Service: Endoscopy;;   PORT-A-CATH REMOVAL Right 09/28/2022   Procedure: MINOR REMOVAL PORT-A-CATH;  Surgeon: Lewie Chamber, DO;  Location: [facility];  Service: General;  Laterality: Right;   PORTACATH PLACEMENT Right 04/12/2023   Procedure: INSERTION PORT-A-CATH, RIJ;  Surgeon: Lewie Chamber, DO;  Location: [facility];  Service: General;  Laterality: Right;   SKIN CANCER EXCISION     Face   SPINE SURGERY     SUBMUCOSAL TATTOO INJECTION  01/25/2020   Procedure: SUBMUCOSAL TATTOO INJECTION;  Surgeon: Lanelle Bal, DO;  [facility];  Service: Endoscopy;;   Social History:  reports that she quit smoking about 8 years ago. Her smoking use included cigarettes. She started smoking about 48 years ago. She has a 60 pack-year smoking history. She has never used smokeless tobacco. She reports that she does not drink alcohol and does not use drugs.  Allergies  Allergen Reactions   Keflex [Cephalexin] Nausea And Vomiting    Family History  Problem Relation Age of Onset   Diabetes Father    Heart disease Father 34        MI   Hypertension Father    Anemia Mother        Transfusion dependent   COPD Sister    Cancer Paternal Grandmother 55       Pancreatic    Prior to Admission medications   Medication Sig Start Date End Date Taking? Authorizing Provider  albuterol (PROVENTIL) (2.5 MG/3ML) 0.083% nebulizer solution Take 3 mLs (2.5 mg total) by nebulization every 6 (six) hours as needed for wheezing or shortness of breath. 05/15/23  Yes Gottschalk, Ashly M, DO  albuterol (VENTOLIN HFA) 108 (90 Base) MCG/ACT inhaler Inhale 2 puffs into the lungs every 6 (six) hours as needed for wheezing or shortness of breath. 05/04/23  Yes Hawks, Christy A, FNP  carvedilol (COREG) 6.25 MG tablet Take 1 tablet (6.25 mg total) by mouth 2 (two) times daily with a meal. 06/12/23 07/12/23 Yes Shahmehdi, Seyed A, MD  desvenlafaxine (PRISTIQ) 100 MG 24 hr tablet Take 100 mg by mouth in the morning. 01/26/23  Yes [provider]  doxycycline (VIBRA-TABS) 100 MG tablet Take 1 tablet (100 mg total) by mouth every 12 (twelve) hours for 2 days. 06/12/23 06/15/23 Yes Shahmehdi, Gemma Payor, MD  esomeprazole (NEXIUM) 40 MG capsule  Take 1 capsule (40 mg total) by mouth daily. 06/12/23  Yes Shahmehdi, Gemma Payor, MD  ferrous sulfate 325 (65 FE) MG tablet Take 1 tablet (325 mg total) by mouth daily with breakfast. 06/12/23 10/10/23 Yes Shahmehdi, Seyed A, MD  fluticasone-salmeterol (ADVAIR) 100-50 MCG/ACT AEPB Inhale 1 puff into the lungs 2 (two) times daily. 03/07/23  Yes [provider]  furosemide (LASIX) 40 MG tablet Take 1 tablet (40 mg total) by mouth daily. 06/12/23  Yes Shahmehdi, Seyed A, MD  lenalidomide (REVLIMID) 15 MG capsule TAKE 1 CAPSULE BY MOUTH EVERY DAY FOR 21 DAYS ON THEN 7 DAYS OFF 06/06/23  Yes Doreatha Massed, MD  lidocaine-prilocaine (EMLA) cream Apply 1 Application topically as needed. Apply a small amount to port a cath site and cover with plastic wrap 1 hour prior to infusion appointments 04/12/23  Yes Pappayliou,  Santina Evans A, DO  lovastatin (MEVACOR) 20 MG tablet Take 1 tablet (20 mg total) by mouth at bedtime. 06/12/23  Yes Shahmehdi, Seyed A, MD  nystatin (MYCOSTATIN/NYSTOP) powder Apply topically 2 (two) times daily. Patient taking differently: Apply 1 Application topically 2 (two) times daily as needed (Yeast). 01/30/23  Yes Dettinger, Elige Radon, MD  oxyCODONE-acetaminophen (PERCOCET) 10-325 MG tablet Take 1 tablet by mouth every 6 (six) hours as needed for pain.   Yes [provider]  OXYGEN Inhale 3 L into the lungs continuous.   Yes [provider]  OZEMPIC, 0.25 OR 0.5 MG/DOSE, 2 MG/3ML SOPN Inject 0.25 mg into the skin every 7 (seven) days. 12/02/22  Yes Gottschalk, Kathie Rhodes M, DO  potassium chloride SA (KLOR-CON M) 20 MEQ tablet Take 1 tablet (20 mEq total) by mouth daily. 06/14/23 07/14/23 Yes Kendell Bane, MD    Physical Exam: Vitals:   06/15/23 1000 06/15/23 1015 06/15/23 1115 06/15/23 1500  BP: 137/65 (!) 132/117 128/69 (!) 166/84  Pulse: 81 79 73   Resp: (!) 24 18 16    Temp:    98.4 F (36.9 C)  TempSrc:    Oral  SpO2: 97% 96% 96%   Weight:    127.8 kg  Height:    5\' 2"  (1.575 m)   General exam: Alert, awake, oriented x 3; tachypneic and taking shallow breaths.  No fever, no chest pain and no nausea vomiting.  Reporting back discomfort. Respiratory system: Fair air movement, positive rhonchi and diffuse expiratory wheezing.  No using accessory muscles. Cardiovascular system:RRR. No rubs or gallops; no JVD. Gastrointestinal system: Abdomen is obese, nondistended, soft and nontender. No organomegaly or masses felt. Normal bowel sounds heard. Central nervous system: No focal neurological deficits. Extremities: No cyanosis or clubbing; 1-2+ edema appreciated bilaterally (patient expressed unchanged from baseline). Skin: No petechiae. Psychiatry: Judgement and insight appear normal. Mood & affect appropriate.   Data Reviewed: BNP: 44 CBC: White blood cells 4.4,  hemoglobin 12.5 and platelet count 77K Basic metabolic panel: Sodium 138, potassium 3.5, chloride 95, bicarb 31, BUN 22, creatinine 0.70 and GFR >60 Magnesium: 2.3  Assessment and Plan: 1-acute on chronic hypoxic respiratory failure/COPD exacerbation and positive atelectasis/right lung middle lobe collapse -Continue steroids, oxygen supplementation, bronchodilator management, mucolytic's and finish oral antibiotics as previously instructed -CPAP nightly will be provided -Continue home Lasix and low-sodium diet -Follow clinical response. -Patient is chronically on 3 L nasal cannula supplementation; at time of admission requiring 5 L.  2-essential hypertension -Continue home antihypertensive agent -Heart healthy/low-sodium diet discussed with patient.  3-hyperlipidemia -Continue statin.  4-history of cirrhosis -Continue home diuresis and PPI -  Continue patient follow-up with gastroenterology service.  5-chronic thrombocytopenia -Platelets count less than 100,000; will avoid heparin products -SCDs for DVT prophylaxis. -Continue to follow trend -No overt bleeding appreciated.  6-morbid obesity -Body mass index is 51.53 kg/m. -Low-calorie diet, portion control and increase physical activity discussed with patient.  7-chronic pain -Continue analgesic therapy and muscle relaxants -CT scan demonstrated age indeterminate T6 and T8 compression fractures. -Physical therapy evaluation requested  8-type 2 diabetes mellitus -Update A1c -Continue sliding scale insulin, Semglee and modified carbohydrate diet -Follow CBG fluctuation and adjust therapy as needed -Expecting elevated CBGs with steroid therapy    Advance Care Planning:   Code Status: Limited: Do not attempt resuscitation (DNR) -DNR-LIMITED -Do Not Intubate/DNI    Consults: none   Family Communication: No family at bedside.  Severity of Illness: The appropriate patient status for this patient is OBSERVATION. Observation  status is judged to be reasonable and necessary in order to provide the required intensity of service to ensure the patient's safety. The patient's presenting symptoms, physical exam findings, and initial radiographic and laboratory data in the context of their medical condition is felt to place them at decreased risk for further clinical deterioration. Furthermore, it is anticipated that the patient will be medically stable for discharge from the hospital within 2 midnights of admission.   Author: Vassie Loll, MD 06/15/2023 6:21 PM  For on call review [email or web address].

## 2023-06-15 NOTE — ED Notes (Signed)
 Patient transported to CT

## 2023-06-15 NOTE — Progress Notes (Signed)
 RT was called to patient room after becoming distressed while receiving a wipe down. RN turned patient O2 up to 5lpm form 3lpm. Rt placed patient on PRN Duoneb. Patient saturations before HHN was 99%. Patient still in distress and very anxious. Rt spoke with patient about why she was being placed on BIPAP and she was agreeable to try device. Patient saturation on BIPAP was 100% HR 77. Patient agreed to wear for a few hours to help with her health issues at present.

## 2023-06-15 NOTE — Progress Notes (Signed)
 Called to room by NT due to patient having difficulty breathing. Upon arrival patient on 4L of oxygen with O2 sat at 93%. RT called to bedside, PRN Neb treatment given and work of breathing eased. O2 sat 99% on 5L. Dr. Thomes Dinning made aware.

## 2023-06-15 NOTE — Plan of Care (Signed)
  Problem: Coping: Goal: Ability to adjust to condition or change in health will improve Outcome: Not Progressing

## 2023-06-15 NOTE — Transitions of Care (Post Inpatient/ED Visit) (Signed)
   06/15/2023  Name: Savannah Burns MRN: [id number] DOB: [birth year]  Today's TOC FU Call Status: Spoke with patient who reports she went to ED on 06/14/23 and is still there, states " they are admitting me because I have a collapsed lung"   Attempted to reach the patient regarding the most recent Inpatient/ED visit.  Follow Up Plan: No further outreach attempts will be made at this time.  Irving Shows [facility], BSN RN Care Manager/ Transition of Care [facility] [phone]

## 2023-06-15 NOTE — TOC Initial Note (Signed)
 Transition of Care [facility]) - Initial/Assessment Note    Patient Details  Name: Savannah Burns MRN: [id number] Date of Birth: 1950/06/01  [name] CM/SW Contact:    Barron Alvine, RN Phone Number: 06/15/2023, 7:29 PM  Clinical Narrative:                 Pt recently hospitalized c/RSV. Pt readmitted c/SOB 2/2 atelectasis with complete collapse of right middle lobe and very poor air movement, per chart. Pt has O2 at home c/[employer]. Pt states, she needs a shower chair/bench. Her current one is cracked. Pt plans to dc home. TOC to follow.         Patient Goals and CMS Choice            Expected Discharge Plan and Services                                              Prior Living Arrangements/Services                       Activities of Daily Living   ADL Screening (condition at time of admission) Independently performs ADLs?: Yes (appropriate for developmental age) Is the patient deaf or have difficulty hearing?: No Does the patient have difficulty seeing, even when wearing glasses/contacts?: No Does the patient have difficulty concentrating, remembering, or making decisions?: No  Permission Sought/Granted                  Emotional Assessment              Admission diagnosis:  Atelectasis of both lungs [J98.11] Acute and chronic respiratory failure with hypoxia (HCC) [J96.21] Acute on chronic respiratory failure with hypoxia (HCC) [J96.21] Patient Active Problem List   Diagnosis Date Noted   Acute on chronic respiratory failure with hypoxia (HCC) 06/09/2023   Long term (current) use of antibiotics 10/25/2022   Gastroesophageal reflux disease without esophagitis 10/03/2022   Port or reservoir infection 09/28/2022   Axillary abscess 09/28/2022   Staphylococcus aureus bacteremia 09/27/2022   MSSA bacteremia 09/27/2022   Obesity, Class III, BMI 40-49.9 (morbid obesity) (HCC) 09/26/2022   Chronic respiratory failure  with hypoxia (HCC) 09/26/2022   Thrombocytopenia (HCC) 09/26/2022   Cellulitis of axilla, right 09/26/2022   Diabetic peripheral neuropathy associated with type 2 diabetes mellitus (HCC) 02/25/2022   Multiple myeloma (HCC) 11/30/2021   H. pylori infection 08/12/2021   Cirrhosis of liver (HCC) 08/04/2021   Hypoalbuminemia 05/17/2020   Moderate protein malnutrition (HCC) 05/17/2020   Melena 05/16/2020   Hyperlipidemia    Hypokalemia    GI bleed 03/20/2020   Lower GI bleed 03/19/2020   Acute on chronic congestive heart failure (HCC)    Symptomatic anemia    Iron deficiency anemia    Heme positive stool    ABLA (acute blood loss anemia) 01/22/2020   Supplemental oxygen dependent 10/22/2019   SOB (shortness of breath) 10/22/2019   Hypertension associated with diabetes (HCC) 10/22/2019   Bilateral hand pain 09/21/2016   Fatigue 07/30/2015   Chronic diastolic HF (heart failure) (HCC) 06/29/2015   Postmenopausal bleeding 04/17/2015   Excessive daytime sleepiness 12/19/2014   Swelling of lower extremity 11/27/2014   Back pain 06/13/2013   Sinusitis, chronic 05/30/2012   Allergic rhinitis 06/06/2011   Hyperlipidemia associated with type 2 diabetes mellitus (HCC) 03/24/2009  Diabetes mellitus, type 2 (HCC) 08/12/2008   Pulmonary nodule 08/29/2007   COPD (chronic obstructive pulmonary disease) (HCC) 02/14/2007   Class 3 obesity 05/25/2006   Depression, recurrent (HCC) 05/25/2006   HYPERTENSION, BENIGN SYSTEMIC 05/25/2006   DJD, UNSPECIFIED 05/25/2006   PCP:  Raliegh Ip, DO Pharmacy:   [facility] [address] [place] - [address] [place] [address] Phone: [phone] Fax: [phone]     Social Drivers of Health (SDOH) Social History: SDOH Screenings   Food Insecurity: No Food Insecurity (06/15/2023)  Housing: Low Risk  (06/15/2023)  Transportation Needs: No Transportation Needs (06/15/2023)  Utilities: Not At Risk (06/15/2023)  Alcohol Screen:  Low Risk  (01/27/2023)  Depression (PHQ2-9): Medium Risk (05/15/2023)  Financial Resource Strain: Low Risk  (06/12/2023)  Physical Activity: Inactive (01/27/2023)  Social Connections: Socially Isolated (06/15/2023)  Stress: No Stress Concern Present (01/27/2023)  Tobacco Use: Medium Risk (06/14/2023)  Health Literacy: Adequate Health Literacy (01/27/2023)   SDOH Interventions:     Readmission Risk Interventions     No data to display

## 2023-06-16 DIAGNOSIS — R0602 Shortness of breath: Secondary | ICD-10-CM | POA: Diagnosis not present

## 2023-06-16 DIAGNOSIS — E785 Hyperlipidemia, unspecified: Secondary | ICD-10-CM | POA: Diagnosis present

## 2023-06-16 DIAGNOSIS — D3502 Benign neoplasm of left adrenal gland: Secondary | ICD-10-CM | POA: Diagnosis present

## 2023-06-16 DIAGNOSIS — I10 Essential (primary) hypertension: Secondary | ICD-10-CM | POA: Diagnosis not present

## 2023-06-16 DIAGNOSIS — J9811 Atelectasis: Secondary | ICD-10-CM

## 2023-06-16 DIAGNOSIS — Z66 Do not resuscitate: Secondary | ICD-10-CM | POA: Diagnosis present

## 2023-06-16 DIAGNOSIS — K7581 Nonalcoholic steatohepatitis (NASH): Secondary | ICD-10-CM | POA: Diagnosis present

## 2023-06-16 DIAGNOSIS — Z85828 Personal history of other malignant neoplasm of skin: Secondary | ICD-10-CM | POA: Diagnosis not present

## 2023-06-16 DIAGNOSIS — J9621 Acute and chronic respiratory failure with hypoxia: Secondary | ICD-10-CM | POA: Diagnosis present

## 2023-06-16 DIAGNOSIS — D696 Thrombocytopenia, unspecified: Secondary | ICD-10-CM | POA: Diagnosis present

## 2023-06-16 DIAGNOSIS — K7460 Unspecified cirrhosis of liver: Secondary | ICD-10-CM | POA: Diagnosis present

## 2023-06-16 DIAGNOSIS — K219 Gastro-esophageal reflux disease without esophagitis: Secondary | ICD-10-CM | POA: Diagnosis present

## 2023-06-16 DIAGNOSIS — Z8249 Family history of ischemic heart disease and other diseases of the circulatory system: Secondary | ICD-10-CM | POA: Diagnosis not present

## 2023-06-16 DIAGNOSIS — Z87891 Personal history of nicotine dependence: Secondary | ICD-10-CM | POA: Diagnosis not present

## 2023-06-16 DIAGNOSIS — K7469 Other cirrhosis of liver: Secondary | ICD-10-CM | POA: Diagnosis not present

## 2023-06-16 DIAGNOSIS — E66813 Obesity, class 3: Secondary | ICD-10-CM | POA: Diagnosis present

## 2023-06-16 DIAGNOSIS — J441 Chronic obstructive pulmonary disease with (acute) exacerbation: Secondary | ICD-10-CM | POA: Diagnosis present

## 2023-06-16 DIAGNOSIS — I110 Hypertensive heart disease with heart failure: Secondary | ICD-10-CM | POA: Diagnosis present

## 2023-06-16 DIAGNOSIS — G894 Chronic pain syndrome: Secondary | ICD-10-CM | POA: Diagnosis present

## 2023-06-16 DIAGNOSIS — Z860100 Personal history of colon polyps, unspecified: Secondary | ICD-10-CM | POA: Diagnosis not present

## 2023-06-16 DIAGNOSIS — Z6841 Body Mass Index (BMI) 40.0 and over, adult: Secondary | ICD-10-CM | POA: Diagnosis not present

## 2023-06-16 DIAGNOSIS — E1169 Type 2 diabetes mellitus with other specified complication: Secondary | ICD-10-CM | POA: Diagnosis present

## 2023-06-16 DIAGNOSIS — M549 Dorsalgia, unspecified: Secondary | ICD-10-CM | POA: Diagnosis present

## 2023-06-16 DIAGNOSIS — Z9981 Dependence on supplemental oxygen: Secondary | ICD-10-CM | POA: Diagnosis not present

## 2023-06-16 DIAGNOSIS — C9000 Multiple myeloma not having achieved remission: Secondary | ICD-10-CM | POA: Diagnosis present

## 2023-06-16 DIAGNOSIS — I5032 Chronic diastolic (congestive) heart failure: Secondary | ICD-10-CM | POA: Diagnosis present

## 2023-06-16 DIAGNOSIS — M4854XA Collapsed vertebra, not elsewhere classified, thoracic region, initial encounter for fracture: Secondary | ICD-10-CM | POA: Diagnosis present

## 2023-06-16 LAB — CBC
HCT: 39.1 % (ref 36.0–46.0)
Hemoglobin: 12.5 g/dL (ref 12.0–15.0)
MCH: 32.1 pg (ref 26.0–34.0)
MCHC: 32 g/dL (ref 30.0–36.0)
MCV: 100.5 fL — ABNORMAL HIGH (ref 80.0–100.0)
Platelets: 95 10*3/uL — ABNORMAL LOW (ref 150–400)
RBC: 3.89 MIL/uL (ref 3.87–5.11)
RDW: 13.3 % (ref 11.5–15.5)
WBC: 7.4 10*3/uL (ref 4.0–10.5)
nRBC: 0 % (ref 0.0–0.2)

## 2023-06-16 LAB — BASIC METABOLIC PANEL
Anion gap: 9 (ref 5–15)
BUN: 26 mg/dL — ABNORMAL HIGH (ref 8–23)
CO2: 32 mmol/L (ref 22–32)
Calcium: 7.9 mg/dL — ABNORMAL LOW (ref 8.9–10.3)
Chloride: 96 mmol/L — ABNORMAL LOW (ref 98–111)
Creatinine, Ser: 0.57 mg/dL (ref 0.44–1.00)
GFR, Estimated: >60 mL/min (ref >60)
Glucose, Bld: 173 mg/dL — ABNORMAL HIGH (ref 70–99)
Potassium: 3.6 mmol/L (ref 3.5–5.1)
Sodium: 137 mmol/L (ref 135–145)

## 2023-06-16 LAB — GLUCOSE, CAPILLARY
Glucose-Capillary: 167 mg/dL — ABNORMAL HIGH (ref 70–99)
Glucose-Capillary: 199 mg/dL — ABNORMAL HIGH (ref 70–99)
Glucose-Capillary: 339 mg/dL — ABNORMAL HIGH (ref 70–99)
Glucose-Capillary: 243 mg/dL — ABNORMAL HIGH (ref 70–99)

## 2023-06-16 NOTE — Plan of Care (Signed)

## 2023-06-16 NOTE — Progress Notes (Addendum)
 Progress Note   Patient: Savannah Burns ZOX:[id number] DOB: Jul 05, 1950 DOA: 06/14/2023     0 DOS: the patient was seen and examined on 06/16/2023   Brief hospital admission narrative: Savannah Burns is a 73 y.o. 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  Patient reports going home and being unable to get her medications right away by the time that she acquired in her respiratory status was so bad that she ended up coming back to the hospital for further evaluation and management.   Workup in the ED demonstrating atelectasis with complete collapse of right middle lobe and lingular atelectasis and very poor air movement.  Nebulizer management were provided and TRH contacted to place patient in the hospital for further evaluation and management.   Of note, patient expressed experiencing back discomfort especially with coughing spells and has ran out of her home Percocet medication.  Assessment and Plan: 1-acute on chronic respiratory failure/COPD exacerbation and positive atelectasis/right lung middle lobe collapse -Continue steroids, oxygen supplementation, bronchodilator management, mucolytic's and oral antibiotics. -CPAP nightly will be provided -Continue flutter valve/incentive spirometer instructions -Repeat chest x-ray in a.m. -Follow clinical response.  2-hyperlipidemia -Continue statin.  3-essential hypertension -Continue home antihypertensive agent -Heart healthy diet discussed with patient.  4-chronic thrombocytopenia -Associated with underlying history of cirrhosis -No overt bleeding appreciated -Continue SCDs for  DVT prophylaxis and avoid heparin products -Follow platelet counts trend.  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  6-morbid obesity Body mass index is 51.53 kg/m.  -Low-calorie diet, portion control and increase physical activity discussed with patient.  7-chronic pain syndrome -Continue analgesic therapy and muscle relaxants -CT scan demonstrating age indeterminate T6 and T8 compression fractures; patient will benefit of outpatient follow-up.  8-type 2 diabetes mellitus -A1c 6.7 -Continue sliding scale insulin, Semglee and follow CBG fluctuation -Anticipating elevated CBGs in the setting of his steroids usage.  9-history of multiple myeloma -Continue patient follow-up with oncology service -Patient receiving Revlimid infusions. -appears to be in remission  Subjective:  Feeling better and breathing easier; still complaining of back pain and short winded sensation.  No nausea, no vomiting, no chest pain.  Physical Exam: Vitals:   06/15/23 2323 06/16/23 0339 06/16/23 0840 06/16/23 1223  BP: (!) 154/86 (!) 148/80  (!) 126/56  Pulse: 66 79  69  Resp: 20 18    Temp: (!) 97.5 F (36.4 C) 97.7 F (36.5 C)  98 F (36.7 C)  TempSrc: Oral Oral  Oral  SpO2: 99% 93% 90% 93%  Weight:      Height:       General exam: Alert, awake, oriented x 3; morbidly obese in appearance; no chest pain, no nausea, no vomiting.  Still short winded and experiencing intermittent back pain.  3-4 L nasal cannula supplementation in place. Respiratory system: Positive expiratory wheezing bilaterally and positive rhonchi appreciated on exam.  No using accessory muscle. Cardiovascular system:RRR.  No rubs, no gallops, unable to assess JVD with body habitus. Gastrointestinal system: Abdomen is obese, nondistended, soft and nontender. No organomegaly or  masses felt. Normal bowel sounds heard. Central nervous system:  No focal neurological  deficits. Extremities: No cyanosis or clubbing; 1-2+ edema appreciated bilaterally (patient reported to be baseline). Skin: No petechiae. Psychiatry: Judgement and insight appear normal. Mood & affect appropriate.   Data Reviewed: Basic metabolic panel: Sodium 137, potassium 3.6, chloride 96, bicarb 32, BUN 26, creatinine 0.57 and GFR >60 CBC: WBC 7.4, hemoglobin 12.5 and platelet count 95K  Family Communication: Granddaughter at bedside.  Disposition: Status is: Inpatient Remains inpatient appropriate because: Continue IV steroids and bronchodilator management   Planned Discharge Destination: Home  Time spent: 50 minutes  Author: Vassie Loll, MD 06/16/2023 5:04 PM  For on call review [email or web address].

## 2023-06-16 NOTE — Plan of Care (Signed)
   Problem: Education: Goal: Ability to describe self-care measures that may prevent or decrease complications (Diabetes Survival Skills Education) will improve Outcome: Progressing Goal: Individualized Educational Video(s) Outcome: Progressing

## 2023-06-16 NOTE — TOC Progression Note (Signed)
 Transition of Care [facility]) - Progression Note    Patient Details  Name: Savannah Burns MRN: [id number] Date of Birth: [birth year]  [facility]) CM/SW Contact  Leitha Bleak, RN Phone Number: 06/16/2023, 3:10 PM  Clinical Narrative:   Patient wants 3N1, referral sent to [facility] with [name] for delivery. DC planning for tomorrow.   cted Discharge Plan and Services     Social Determinants of Health (SDOH) Interventions SDOH Screenings   Food Insecurity: No Food Insecurity (06/15/2023)  Housing: Low Risk  (06/15/2023)  Transportation Needs: No Transportation Needs (06/15/2023)  Utilities: Not At Risk (06/15/2023)  Alcohol Screen: Low Risk  (01/27/2023)  Depression (PHQ2-9): Medium Risk (05/15/2023)  Financial Resource Strain: Low Risk  (06/12/2023)  Physical Activity: Inactive (01/27/2023)  Social Connections: Socially Isolated (06/15/2023)  Stress: No Stress Concern Present (01/27/2023)  Tobacco Use: Medium Risk (06/14/2023)  Health Literacy: Adequate Health Literacy (01/27/2023)    Readmission Risk Interventions     No data to display

## 2023-06-17 ENCOUNTER — Inpatient Hospital Stay (HOSPITAL_COMMUNITY)

## 2023-06-17 DIAGNOSIS — R0602 Shortness of breath: Secondary | ICD-10-CM

## 2023-06-17 LAB — GLUCOSE, CAPILLARY
Glucose-Capillary: 137 mg/dL — ABNORMAL HIGH (ref 70–99)
Glucose-Capillary: 253 mg/dL — ABNORMAL HIGH (ref 70–99)
Glucose-Capillary: 344 mg/dL — ABNORMAL HIGH (ref 70–99)
Glucose-Capillary: 285 mg/dL — ABNORMAL HIGH (ref 70–99)
Glucose-Capillary: 288 mg/dL — ABNORMAL HIGH (ref 70–99)

## 2023-06-17 NOTE — Progress Notes (Signed)
 Progress Note   Patient: Savannah Burns:[id number] DOB: 04/23/1950 DOA: 06/14/2023     1 DOS: the patient was seen and examined on 06/17/2023   Brief hospital admission narrative: Savannah Burns is a 73 y.o. 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  Patient reports going home and being unable to get her medications right away by the time that she acquired in her respiratory status was so bad that she ended up coming back to the hospital for further evaluation and management.   Workup in the ED demonstrating atelectasis with complete collapse of right middle lobe and lingular atelectasis and very poor air movement.  Nebulizer management were provided and TRH contacted to place patient in the hospital for further evaluation and management.   Of note, patient expressed experiencing back discomfort especially with coughing spells and has ran out of her home Percocet medication.  Assessment and Plan: 1-acute on chronic respiratory failure/COPD exacerbation and positive atelectasis/right lung middle lobe collapse -Continue steroids, oxygen supplementation, bronchodilator management, mucolytic's and oral antibiotics. -Continue flutter valve/incentive spirometer instructions 06/17/23 -Was compliant with CPAP overnight, does not have CPAP at home -Repeat chest x-ray on 06/17/2023 with mild pulmonary venous congestion and left lower lobe collapse in the setting of small left-sided pleural effusion -Hypoxia and dyspnea persist -Continue Lasix, bronchodilators, prednisone and mucolytics as well as doxycycline  2-hyperlipidemia -Continue  statin.  3-essential hypertension -Continue home antihypertensive agent -Heart healthy diet discussed with patient.  4-chronic thrombocytopenia -Associated with underlying history of cirrhosis -No overt bleeding appreciated -Continue SCDs for DVT prophylaxis and avoid heparin products -Follow platelet counts trend.  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  6-morbid obesity Body mass index is 51.53 kg/m.  -Low-calorie diet, portion control and increase physical activity discussed with patient.  7-chronic pain syndrome -Continue analgesic therapy and muscle relaxants -CT scan demonstrating age indeterminate T6 and T8 compression fractures; patient will benefit of outpatient follow-up.  8-type 2 diabetes mellitus -A1c 6.7 -Continue sliding scale insulin, Semglee and follow CBG fluctuation -Anticipating elevated CBGs in the setting of his steroids usage.  9-history of multiple myeloma -Continue patient follow-up with oncology service -Patient receiving Revlimid infusions. -appears to be in remission  Subjective:  -Dyspnea persist -Cough improving No fevers or chills  Physical Exam: Vitals:   06/16/23 2313 06/17/23 0522 06/17/23 0952 06/17/23 1423  BP:  (!) 141/63  (!) 150/73  Pulse:  64  78  Resp:  14  16  Temp:  98.2 F (36.8 C)  98.1 F (36.7 C)  TempSrc:  Oral  Oral  SpO2: 98% 98% 94% 97%  Weight:      Height:        Physical Exam Gen:- Awake Alert, in no acute distress , morbidly obese  HEENT:- [place].AT, No sclera icterus Nose- [place] 3L/min Neck-Supple Neck,No JVD,.  Lungs-improving air movement, no wheezing CV- S1, S2 normal, RRR, right-sided Port-A-Cath in situ Abd-  +ve B.Sounds, Abd Soft, No tenderness, increased truncal adiposity Extremity/Skin:-Improving edema,   good pedal pulses  Psych-affect is  appropriate, oriented x3 Neuro-generalized weakness, no new focal deficits, no tremors  Family  Communication: none  at bedside.  Disposition: home with HH  Status is: Inpatient Remains inpatient appropriate because: Continue IV steroids and bronchodilator management   Planned Discharge Destination: Home   Author: Shon Hale, MD 06/17/2023 7:34 PM  For on call review [email or web address].

## 2023-06-17 NOTE — TOC Progression Note (Signed)
 Transition of Care [facility]) - Progression Note    Patient Details  Name: Savannah Burns MRN: [id number] Date of Birth: Sep 16, 1950  [facility] CM/SW Contact  Leitha Bleak, RN Phone Number: 06/17/2023, 12:55 PM  Clinical Narrative:   Per [name] patient states she has a bed side commode, she needs a light weight large wheelchair.  MD at the bedside with CM on the phone. TOC explained that [name] will need a credit card number to put on file, She is agreeable, her granddaughter will be here this afternoon. CM updated Zach and Wheelchair has been ordered, note is in the order. 3N1 cancelled.  DC possibly tomorrow.       Barriers to Discharge: Continued Medical Work up  Expected Discharge Plan and Services                         DME Arranged: Wheelchair manual DME Agency: [employer] Date DME Agency Contacted: 06/17/23 Time DME Agency Contacted: [phone] Representative spoke with at DME Agency: Ian Malkin             Social Determinants of Health (SDOH) Interventions SDOH Screenings   Food Insecurity: No Food Insecurity (06/15/2023)  Housing: Low Risk  (06/15/2023)  Transportation Needs: No Transportation Needs (06/15/2023)  Utilities: Not At Risk (06/15/2023)  Alcohol Screen: Low Risk  (01/27/2023)  Depression (PHQ2-9): Medium Risk (05/15/2023)  Financial Resource Strain: Low Risk  (06/12/2023)  Physical Activity: Inactive (01/27/2023)  Social Connections: Socially Isolated (06/15/2023)  Stress: No Stress Concern Present (01/27/2023)  Tobacco Use: Medium Risk (06/14/2023)  Health Literacy: Adequate Health Literacy (01/27/2023)    Readmission Risk Interventions     No data to display

## 2023-06-18 DIAGNOSIS — R0602 Shortness of breath: Secondary | ICD-10-CM | POA: Diagnosis not present

## 2023-06-18 LAB — GLUCOSE, CAPILLARY
Glucose-Capillary: 137 mg/dL — ABNORMAL HIGH (ref 70–99)
Glucose-Capillary: 228 mg/dL — ABNORMAL HIGH (ref 70–99)
Glucose-Capillary: 258 mg/dL — ABNORMAL HIGH (ref 70–99)
Glucose-Capillary: 296 mg/dL — ABNORMAL HIGH (ref 70–99)

## 2023-06-18 MED ORDER — HYDRALAZINE HCL 20 MG/ML IJ SOLN: 10.0000 mg | Freq: Four times a day (QID) | INTRAMUSCULAR | Status: DC | PRN

## 2023-06-18 MED ORDER — METHOCARBAMOL 500 MG PO TABS
750.0000 mg | ORAL_TABLET | Freq: Three times a day (TID) | ORAL | Status: DC
  Administered 2023-06-18 – 2023-06-19 (×3): 750 mg via ORAL
  Filled 2023-06-18 (×3): qty 2

## 2023-06-18 NOTE — Progress Notes (Addendum)
 Progress Note   Patient: Savannah Burns ZOX:[id number] DOB: 03/15/1951 DOA: 06/14/2023     2 DOS: the patient was seen and examined on 06/18/2023   Brief hospital admission narrative: Savannah Burns is a 73 y.o. 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  Patient reports going home and being unable to get her medications right away by the time that she acquired in her respiratory status was so bad that she ended up coming back to the hospital for further evaluation and management.   Workup in the ED demonstrating atelectasis with complete collapse of right middle lobe and lingular atelectasis and very poor air movement.  Nebulizer management were provided and TRH contacted to place patient in the hospital for further evaluation and management.   Of note, patient expressed experiencing back discomfort especially with coughing spells and has ran out of her home Percocet medication.  Assessment and Plan: 1-acute on chronic respiratory failure/COPD exacerbation and positive atelectasis/right lung middle lobe collapse -Continue steroids, oxygen supplementation, bronchodilator management, mucolytic's and oral antibiotics. -Continue flutter valve/incentive spirometer instructions 06/18/23 -Was compliant with CPAP overnight, does not have CPAP at home -Repeat chest x-ray on 06/17/2023 with mild pulmonary venous congestion and left lower lobe collapse in the setting of small left-sided pleural effusion -Hypoxia and dyspnea persist --Continue Lasix, bronchodilators, prednisone and mucolytics as well as doxycycline ---patient reluctant to be  discharged home on 06/18/2023, anticipate discharge home on 06/19/2023 with family and home health services -Patient already has home O2  2-hyperlipidemia -Continue statin.  3-essential hypertension -Continue home antihypertensive agent -Heart healthy diet discussed with patient.  4-chronic thrombocytopenia -Associated with underlying history of cirrhosis -No overt bleeding appreciated -Continue SCDs for DVT prophylaxis and avoid heparin products -Follow platelet counts trend.  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  6-morbid obesity Body mass index is 51.53 kg/m.  -Low-calorie diet, portion control and increase physical activity discussed with patient.  7-chronic pain syndrome -Continue analgesic therapy and muscle relaxants -CT scan demonstrating age indeterminate T6 and T8 compression fractures; patient will benefit of outpatient follow-up.  8-type 2 diabetes mellitus -A1c 6.7 -Continue sliding scale insulin, Semglee and follow CBG fluctuation -Anticipating elevated CBGs in the setting of his steroids usage.  9-history of multiple myeloma -Continue patient follow-up with oncology service -Patient receiving Revlimid infusions. -appears to be in remission  Subjective:  -Dyspnea persist -GrandSon Savannah Burns at bedside, questions answered patient reluctant to be discharged home on 06/18/2023, anticipate discharge home on 06/19/2023 with family and home health services -Patient already has home O2  Physical Exam: Vitals:   06/18/23 0534 06/18/23 1023 06/18/23 1027 06/18/23 1315  BP: (!) 162/90   (!) 158/88  Pulse: 69   80  Resp: 16   18  Temp: 97.8 F (36.6 C)   98.8 F (37.1 C)  TempSrc: Oral     SpO2: 99% (!) 89% 93% 94%  Weight:      Height:        Physical Exam Gen:- Awake Alert, in no acute distress ,  morbidly obese  HEENT:- [place].AT, No sclera icterus Nose- [place] 3L/min Neck-Supple Neck,No JVD,.  Lungs-improving air movement, no wheezing CV- S1, S2 normal, RRR, right-sided Port-A-Cath in situ Abd-  +ve B.Sounds, Abd Soft, No tenderness, increased truncal adiposity Extremity/Skin:-Improving edema,   good pedal pulses  Psych-affect is appropriate, oriented x3 Neuro-generalized weakness, no new focal deficits, no tremors  Family Communication: Savannah Burns at bedside.  Disposition: home with HH  Status is: Inpatient     Planned Discharge Destination: Home   Author: Shon Hale, MD 06/18/2023 6:55 PM  For on call review [email or web address].

## 2023-06-18 NOTE — Plan of Care (Signed)

## 2023-06-18 NOTE — Progress Notes (Signed)
 Patient has refused to wear CPAP and unit has been removed form room; RT has unit on stand by if needed.

## 2023-06-19 ENCOUNTER — Telehealth (HOSPITAL_COMMUNITY): Payer: Self-pay

## 2023-06-19 DIAGNOSIS — R0602 Shortness of breath: Secondary | ICD-10-CM | POA: Diagnosis not present

## 2023-06-19 LAB — GLUCOSE, CAPILLARY
Glucose-Capillary: 115 mg/dL — ABNORMAL HIGH (ref 70–99)
Glucose-Capillary: 196 mg/dL — ABNORMAL HIGH (ref 70–99)

## 2023-06-19 MED ORDER — FLUTICASONE-SALMETEROL 100-50 MCG/ACT IN AEPB: 1.0000 | INHALATION_SPRAY | Freq: Two times a day (BID) | RESPIRATORY_TRACT | 5 refills | Status: AC

## 2023-06-19 MED ORDER — FUROSEMIDE 40 MG PO TABS: 40.0000 mg | ORAL_TABLET | Freq: Every day | ORAL | 0 refills | Status: DC

## 2023-06-19 MED ORDER — ALBUTEROL SULFATE HFA 108 (90 BASE) MCG/ACT IN AERS: 2.0000 | INHALATION_SPRAY | Freq: Four times a day (QID) | RESPIRATORY_TRACT | 3 refills | Status: AC | PRN

## 2023-06-19 MED ORDER — OXYCODONE HCL 5 MG PO TABS: 5.0000 mg | ORAL_TABLET | Freq: Three times a day (TID) | ORAL | 0 refills | Status: DC | PRN

## 2023-06-19 MED ORDER — CARVEDILOL 6.25 MG PO TABS: 6.2500 mg | ORAL_TABLET | Freq: Two times a day (BID) | ORAL | 5 refills | Status: DC

## 2023-06-19 MED ORDER — ALBUTEROL SULFATE (2.5 MG/3ML) 0.083% IN NEBU: 2.5000 mg | INHALATION_SOLUTION | RESPIRATORY_TRACT | 1 refills | Status: AC | PRN

## 2023-06-19 MED ORDER — ACETAMINOPHEN 325 MG PO TABS: 650.0000 mg | ORAL_TABLET | Freq: Four times a day (QID) | ORAL | Status: DC | PRN

## 2023-06-19 MED ORDER — HEPARIN SOD (PORK) LOCK FLUSH 100 UNIT/ML IV SOLN
500.0000 [IU] | INTRAVENOUS | Status: AC | PRN
  Administered 2023-06-19: 500 [IU]

## 2023-06-19 MED ORDER — METHOCARBAMOL 750 MG PO TABS: 750.0000 mg | ORAL_TABLET | Freq: Three times a day (TID) | ORAL | 1 refills | Status: DC

## 2023-06-19 MED ORDER — DM-GUAIFENESIN ER 30-600 MG PO TB12
1.0000 | ORAL_TABLET | Freq: Two times a day (BID) | ORAL | 0 refills | Status: AC
Start: 2023-06-19 — End: 2023-06-29

## 2023-06-19 MED ORDER — PREDNISONE 20 MG PO TABS: 40.0000 mg | ORAL_TABLET | Freq: Every day | ORAL | 0 refills | Status: AC

## 2023-06-19 NOTE — TOC Transition Note (Signed)
 Transition of Care [facility]) - Discharge Note   Patient Details  Name: Savannah Burns MRN: [id number] Date of Birth: 1950-06-28  [facility]) CM/SW Contact:  Villa Herb, LCSWA Phone Number: 06/19/2023, 11:43 AM   Clinical Narrative:    CSW updated that pt is medically stable for D/C. [name] working on getting manual wheelchair ordered and delivered to pts home. Pt has home O2 thorough [employer] and is requesting and POC, CSW spoke to [place] with [employer] who states he will follow up with his agency and will follow up with getting POC for pt in community. Pt is [facility] at [facility], referral has been made. TOC signing off.   Final next level of care: OP Rehab Barriers to Discharge: Barriers Resolved   Patient Goals and CMS Choice Patient states their goals for this hospitalization and ongoing recovery are:: return home CMS Medicare.gov Compare Post Acute Care list provided to:: Patient Choice offered to / list presented to : Patient      Discharge Placement                       Discharge Plan and Services Additional resources added to the After Visit Summary for                  DME Arranged: Wheelchair manual DME Agency: [employer] Date DME Agency Contacted: 06/17/23 Time DME Agency Contacted: [phone] Representative spoke with at DME Agency: Ian Malkin            Social Drivers of Health (SDOH) Interventions SDOH Screenings   Food Insecurity: No Food Insecurity (06/15/2023)  Housing: Low Risk  (06/15/2023)  Transportation Needs: No Transportation Needs (06/15/2023)  Utilities: Not At Risk (06/15/2023)  Alcohol Screen: Low Risk  (01/27/2023)  Depression (PHQ2-9): Medium Risk (05/15/2023)  Financial Resource Strain: Low Risk  (06/12/2023)  Physical Activity: Inactive (01/27/2023)  Social Connections: Socially Isolated (06/15/2023)  Stress: No Stress Concern Present (01/27/2023)  Tobacco Use: Medium Risk (06/14/2023)  Health Literacy: Adequate  Health Literacy (01/27/2023)     Readmission Risk Interventions     No data to display

## 2023-06-19 NOTE — Telephone Encounter (Signed)
 Called to confirm/remind patient of their appointment [facility] on 06/20/2023 11:00.   Appointment:   [] Confirmed  [] Left mess   [x] No answer/No voice mail  [] Phone not in service  Patient reminded to bring all medications and/or complete list.  Confirmed patient has transportation. Gave directions, instructed to utilize valet parking.

## 2023-06-19 NOTE — Discharge Summary (Signed)
 Savannah Burns, is a 73 y.o. female  DOB [birth year]  MRN [id number].  Admission date:  06/14/2023  Admitting Physician  Frankey Shown, DO  Discharge Date:  06/19/2023   Primary MD  Raliegh Ip, DO  Recommendations for primary care physician for things to follow:  1)Very Low-salt diet advised---Less than 2 gm of Sodium per day advised----ok to use Mrs DASH salt substitute instead of Salt 2)Weigh yourself daily, call if you gain more than 3 pounds in 1 day or more than 5 pounds in 1 week as your diuretic medications may need to be adjusted 3)Limit your Fluid  intake to no more than 60 ounces (1.8 Liters) per day 4)Repeat CBC and BMP Blood Tests around Monday 06/26/23 5)Follow - up with Dr. Cyril Mourning Pulmonologist in [place] to schedule sleep study and possible Bronchoscopy--- with Complete collapse of the right middle lobe and left lower lobe with Mild to moderate severity lingular atelectasis.  6) outpatient physical therapy advised  Admission Diagnosis  Atelectasis of both lungs [J98.11] Acute and chronic respiratory failure with hypoxia (HCC) [J96.21] Acute on chronic respiratory failure with hypoxia (HCC) [J96.21]   Discharge Diagnosis  Atelectasis of both lungs [J98.11] Acute and chronic respiratory failure with hypoxia (HCC) [J96.21] Acute on chronic respiratory failure with hypoxia (HCC) [J96.21]    Principal Problem:   SOB (shortness of breath) Active Problems:   Chronic respiratory failure with hypoxia (HCC)   Multiple myeloma (HCC)   Obesity, Class III, BMI 40-49.9 (morbid obesity) (HCC)   Hyperlipidemia associated with type 2 diabetes mellitus (HCC)   HYPERTENSION, BENIGN SYSTEMIC   COPD (chronic obstructive pulmonary disease) (HCC)   Chronic diastolic HF (heart failure) (HCC)   Cirrhosis of liver (HCC)   Thrombocytopenia (HCC)   Gastroesophageal reflux disease without  esophagitis   Acute on chronic respiratory failure with hypoxia (HCC)      Past Medical History:  Diagnosis Date   Adrenal adenoma, left    Stable   Anxiety    Arthritis    bilateral hands   COPD (chronic obstructive pulmonary disease) (HCC)    Depression    Diabetes mellitus, type 2 (HCC) 08/12/2008   Qualifier: Diagnosis of  By: Dayton Martes MD, Talia     Dyspnea    Esophageal varices (HCC)    Grade II diastolic dysfunction    History of kidney stones    Hyperlipidemia    Hypertension    Lower back pain    Lower GI bleed 03/19/2020   Panic attacks    Pneumonia    currently taking antibiotic and prednisone for early stages of pneumonia   Pulmonary nodules    bilateral   Skin cancer    face    Past Surgical History:  Procedure Laterality Date   BIOPSY  04/07/2020   Procedure: BIOPSY;  Surgeon: Lanelle Bal, DO;  [facility];  Service: Endoscopy;;   Breast Cystectomy  Right    CESAREAN SECTION     COLONOSCOPY WITH PROPOFOL N/A 01/25/2020  Dr. Marletta Lor: Nonbleeding internal hemorrhoids, diverticulosis, 5 mm polyp removed from the ascending colon, 10 mm polyp removed from the sigmoid colon, 30 mm polyp (tubulovillous adenoma with no high-grade dysplasia) removed from the transverse colon via piecemeal status post tattoo.  Other polyps were tubular adenomas.  3 month surveillance colonoscopy recommended.   COLONOSCOPY WITH PROPOFOL N/A 04/07/2020   Procedure: COLONOSCOPY WITH PROPOFOL;  Surgeon: Lanelle Bal, DO;  [facility];  Service: Endoscopy;  Laterality: N/A;  3:00pm, pt knows new time per office   CYSTOSCOPY/URETEROSCOPY/HOLMIUM LASER/STENT PLACEMENT Bilateral 03/01/2019   Procedure: CYSTOSCOPY/RETROGRADEURETEROSCOPY/HOLMIUM LASER/STENT PLACEMENT;  Surgeon: Rene Paci, MD;  Location: [facility];  Service: Urology;  Laterality: Bilateral;  ONLY NEEDS 60 MIN   ESOPHAGOGASTRODUODENOSCOPY (EGD) WITH PROPOFOL N/A 01/25/2020   Dr. Marletta Lor: 4  columns grade 1 esophageal varices   ESOPHAGOGASTRODUODENOSCOPY (EGD) WITH PROPOFOL N/A 05/18/2020   Procedure: ESOPHAGOGASTRODUODENOSCOPY (EGD) WITH PROPOFOL;  Surgeon: Lanelle Bal, DO;  [facility];  Service: Endoscopy;  Laterality: N/A;   ESOPHAGOGASTRODUODENOSCOPY (EGD) WITH PROPOFOL N/A 07/28/2020   Procedure: ESOPHAGOGASTRODUODENOSCOPY (EGD) WITH PROPOFOL;  Surgeon: Lanelle Bal, DO;  [facility];  Service: Endoscopy;  Laterality: N/A;  am or early PM due to givens capsule placement   GIVENS CAPSULE STUDY N/A 05/18/2020   Procedure: GIVENS CAPSULE STUDY;  Surgeon: Dolores Frame, MD;  [facility];  Service: Gastroenterology;  Laterality: N/A;   GIVENS CAPSULE STUDY N/A 07/28/2020   Procedure: GIVENS CAPSULE STUDY;  Surgeon: Lanelle Bal, DO;  [facility];  Service: Endoscopy;  Laterality: N/A;   IR IMAGING GUIDED PORT INSERTION  12/02/2021   IRRIGATION AND DEBRIDEMENT ABSCESS Right 09/28/2022   Procedure: IRRIGATION AND DEBRIDEMENT ABSCESS;  Surgeon: Lewie Chamber, DO;  Location: [facility];  Service: General;  Laterality: Right;   POLYPECTOMY  01/25/2020   Procedure: POLYPECTOMY;  Surgeon: Lanelle Bal, DO;  [facility];  Service: Endoscopy;;   POLYPECTOMY  04/07/2020   Procedure: POLYPECTOMY INTESTINAL;  Surgeon: Lanelle Bal, DO;  [facility];  Service: Endoscopy;;   PORT-A-CATH REMOVAL Right 09/28/2022   Procedure: MINOR REMOVAL PORT-A-CATH;  Surgeon: Lewie Chamber, DO;  Location: [facility];  Service: General;  Laterality: Right;   PORTACATH PLACEMENT Right 04/12/2023   Procedure: INSERTION PORT-A-CATH, RIJ;  Surgeon: Lewie Chamber, DO;  Location: [facility];  Service: General;  Laterality: Right;   SKIN CANCER EXCISION     Face   SPINE SURGERY     SUBMUCOSAL TATTOO INJECTION  01/25/2020   Procedure: SUBMUCOSAL TATTOO INJECTION;  Surgeon: Lanelle Bal, DO;   [facility];  Service: Endoscopy;;       HPI  from the history and physical done on the day of admission:   HPI: [name] is a 73 y.o. 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  Patient reports going home and being unable to get her medications right away by the time that she acquired in her respiratory status was so bad that she ended up coming back to the hospital for further evaluation and management.   Workup in the ED demonstrating atelectasis with complete collapse of  right middle lobe and lingular atelectasis and very poor air movement.  Nebulizer management were provided and [facility] contacted to place patient in the hospital for further evaluation and management.   Of note, patient expressed experiencing back discomfort especially with coughing spells and has ran out of her home Percocet medication.   Review of Systems: As mentioned in the history of present illness. All other systems reviewed and are negative.     Hospital Course:     Assessment and Plan: 1-acute on chronic respiratory failure/COPD exacerbation and positive atelectasis/right lung middle lobe collapse -Continue steroids, oxygen supplementation, bronchodilator management, mucolytic's and oral antibiotics. -Continue flutter valve/incentive spirometer instructions 06/18/23 -Was compliant with CPAP overnight, does not have CPAP at home -Repeat chest x-ray on 06/17/2023 with mild pulmonary venous congestion and left lower lobe collapse in the setting of small left-sided pleural effusion -Hypoxia and dyspnea persist --Continue Lasix,  bronchodilators, prednisone and mucolytics as well as doxycycline ---patient reluctant to be discharged home on 06/18/2023, anticipate discharge home on 06/19/2023 with family and home health services -Patient already has home O2   2-hyperlipidemia -Continue statin.   3-essential hypertension -Continue home antihypertensive agent -Heart healthy diet discussed with patient.   4-chronic thrombocytopenia -Associated with underlying history of cirrhosis -No overt bleeding appreciated -Continue SCDs for DVT prophylaxis and avoid heparin products -Follow platelet counts trend.   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   6-morbid obesity Body mass index is 51.53 kg/m.  -Low-calorie diet, portion control and increase physical activity discussed with patient.   7-chronic pain syndrome -Continue analgesic therapy and muscle relaxants -CT scan demonstrating age indeterminate T6 and T8 compression fractures; patient will benefit of outpatient follow-up.   8-type 2 diabetes mellitus -A1c 6.7 -Continue sliding scale insulin, Semglee and follow CBG fluctuation -Anticipating elevated CBGs in the setting of his steroids usage.   9-history of multiple myeloma -Continue patient follow-up with oncology service -Patient receiving Revlimid infusions. -appears to be in remission  Discharge Condition: stable  Follow UP   Follow-up Information     Raliegh Ip, DO. Schedule an appointment as soon as possible for a visit in 1 week(s).   Specialty: Family Medicine Contact information: [address] [place] [address] [phone]         Oretha Milch, MD. Schedule an appointment as soon as possible for a visit in 1 week(s).   Specialty: Pulmonary Disease Why: To schedule sleep study and possible Bronchoscopy--- with Complete  collapse of the right middle lobe and left lower lobe with Mild to moderate severity lingular atelectasis. Contact information: [address] [place] [address] [phone]                  Consults obtained - ***  Diet and Activity recommendation:  As advised  Discharge Instructions    **** Discharge Instructions     Ambulatory referral to Physical Therapy   Complete by: As directed    Iontophoresis - 4 mg/ml of dexamethasone: Yes   T.E.N.S. Unit Evaluation and Dispense as Indicated: Yes   Call MD for:  difficulty breathing, headache or visual disturbances   Complete by: As directed    Call MD for:  persistant dizziness or light-headedness   Complete by: As directed    Call MD for:  persistant nausea and vomiting   Complete by: As directed  Call MD for:  temperature >100.4   Complete by: As directed    Diet - low sodium heart healthy   Complete by: As directed    Discharge instructions   Complete by: As directed    1)Very Low-salt diet advised---Less than 2 gm of Sodium per day advised----ok to use Mrs DASH salt substitute instead of Salt 2)Weigh yourself daily, call if you gain more than 3 pounds in 1 day or more than 5 pounds in 1 week as your diuretic medications may need to be adjusted 3)Limit your Fluid  intake to no more than 60 ounces (1.8 Liters) per day 4)Repeat CBC and BMP Blood Tests around Monday 06/26/23 5)Follow - up with Dr. Cyril Mourning Pulmonologist in [place] to schedule sleep study and possible Bronchoscopy--- with Complete collapse of the right middle lobe and left lower lobe with Mild to moderate severity lingular atelectasis.  6) outpatient physical therapy advised   Increase activity slowly   Complete by: As directed          Discharge Medications     Allergies as of 06/19/2023       Reactions   Keflex [cephalexin] Nausea And Vomiting        Medication List     STOP taking these medications    doxycycline  100 MG tablet Commonly known as: VIBRA-TABS   oxyCODONE-acetaminophen 10-325 MG tablet Commonly known as: PERCOCET       TAKE these medications    acetaminophen 325 MG tablet Commonly known as: TYLENOL Take 2 tablets (650 mg total) by mouth every 6 (six) hours as needed for mild pain (pain score 1-3) (or Fever >/= 101).   albuterol (2.5 MG/3ML) 0.083% nebulizer solution Commonly known as: PROVENTIL Take 3 mLs (2.5 mg total) by nebulization every 4 (four) hours as needed for wheezing or shortness of breath. What changed: when to take this   albuterol 108 (90 Base) MCG/ACT inhaler Commonly known as: VENTOLIN HFA Inhale 2 puffs into the lungs every 6 (six) hours as needed for wheezing or shortness of breath. What changed: Another medication with the same name was changed. Make sure you understand how and when to take each.   carvedilol 6.25 MG tablet Commonly known as: COREG Take 1 tablet (6.25 mg total) by mouth 2 (two) times daily with a meal.   desvenlafaxine 100 MG 24 hr tablet Commonly known as: PRISTIQ Take 100 mg by mouth in the morning.   dextromethorphan-guaiFENesin 30-600 MG 12hr tablet Commonly known as: MUCINEX DM Take 1 tablet by mouth 2 (two) times daily for 10 days.   esomeprazole 40 MG capsule Commonly known as: NexIUM Take 1 capsule (40 mg total) by mouth daily.   ferrous sulfate 325 (65 FE) MG tablet Take 1 tablet (325 mg total) by mouth daily with breakfast.   fluticasone-salmeterol 100-50 MCG/ACT Aepb Commonly known as: ADVAIR Inhale 1 puff into the lungs 2 (two) times daily.   furosemide 40 MG tablet Commonly known as: Lasix Take 1 tablet (40 mg total) by mouth daily.   lenalidomide 15 MG capsule Commonly known as: REVLIMID TAKE 1 CAPSULE BY MOUTH EVERY DAY FOR 21 DAYS ON THEN 7 DAYS OFF   lidocaine-prilocaine cream Commonly known as: EMLA Apply 1 Application topically as needed. Apply a small amount to port a cath site and cover with  plastic wrap 1 hour prior to infusion appointments   lovastatin 20 MG tablet Commonly known as: MEVACOR Take 1 tablet (20 mg total) by mouth  at bedtime.   methocarbamol 750 MG tablet Commonly known as: ROBAXIN Take 1 tablet (750 mg total) by mouth 3 (three) times daily.   nystatin powder Commonly known as: MYCOSTATIN/NYSTOP Apply topically 2 (two) times daily. What changed:  how much to take when to take this reasons to take this   oxyCODONE 5 MG immediate release tablet Commonly known as: Oxy IR/ROXICODONE Take 1 tablet (5 mg total) by mouth every 8 (eight) hours as needed for severe pain (pain score 7-10).   OXYGEN Inhale 3 L into the lungs continuous.   Ozempic (0.25 or 0.5 MG/DOSE) 2 MG/3ML Sopn Generic drug: Semaglutide(0.25 or 0.5MG /DOS) Inject 0.25 mg into the skin every 7 (seven) days.   potassium chloride SA 20 MEQ tablet Commonly known as: KLOR-CON M Take 1 tablet (20 mEq total) by mouth daily.   predniSONE 20 MG tablet Commonly known as: DELTASONE Take 2 tablets (40 mg total) by mouth daily with breakfast for 5 days.               Durable Medical Equipment  (From admission, onward)           Start     Ordered   06/17/23 1233  For home use only DME lightweight manual wheelchair with seat cushion  Once       Comments: Patient needs 20X16  light weight wheel chair. Patient suffers from back pain, weakness, Shortness of breath, obesity which impairs their ability to perform daily activities like bathing, dressing, grooming, and toileting in the home.  A cane, crutch, or walker will not resolve  issue with performing activities of daily living. A wheelchair will allow patient to safely perform daily activities. Patient is not able to propel themselves in the home using a standard weight wheelchair due to general weakness. Patient can self propel in the lightweight wheelchair. Length of need Lifetime. Accessories: elevating leg rests (ELRs), wheel locks,  extensions and anti-tippers.   06/17/23 1233            Major procedures and Radiology Reports - PLEASE review detailed and final reports for all details, in brief -   ***  DG Chest 2 View Result Date: 06/17/2023 CLINICAL DATA:  Shortness of breath.  COPD. EXAM: CHEST - 2 VIEW COMPARISON:  06/09/2023.  CT of 06/17/2023 FINDINGS: Both views are degraded by patient body habitus. Lateral view degraded by patient arm position. Right Port-A-Cath tip low SVC. Midline trachea. Moderate cardiomegaly. Suspect small left pleural effusion. No pneumothorax. Mild pulmonary interstitial prominence. Left lower lobe collapse. IMPRESSION: Cardiomegaly and low lung volumes with probable mild pulmonary venous congestion. Small left pleural effusion with left lower lobe collapse, as on recent CT. Aortic Atherosclerosis (ICD10-I70.0). Electronically Signed   By: Jeronimo Greaves M.D.   On: 06/17/2023 13:29   CT Angio Chest PE W and/or Wo Contrast Result Date: 06/15/2023 CLINICAL DATA:  Back pain with movement. EXAM: CT ANGIOGRAPHY CHEST WITH CONTRAST TECHNIQUE: Multidetector CT imaging of the chest was performed using the standard protocol during bolus administration of intravenous contrast. Multiplanar CT image reconstructions and MIPs were obtained to evaluate the vascular anatomy. RADIATION DOSE REDUCTION: This exam was performed according to the departmental dose-optimization program which includes automated exposure control, adjustment of the mA and/or kV according to patient size and/or use of iterative reconstruction technique. CONTRAST:  75mL OMNIPAQUE IOHEXOL 350 MG/ML SOLN COMPARISON:  September 26, 2022 FINDINGS: Cardiovascular: A right-sided venous Port-A-Cath is seen. There is marked severity calcification of the aortic  arch, without evidence of aortic aneurysm. Satisfactory opacification of the pulmonary arteries to the segmental level. No evidence of pulmonary embolism. There is mild cardiomegaly with moderate  severity coronary artery calcification. No pericardial effusion. Mediastinum/Nodes: No enlarged mediastinal, hilar, or axillary lymph nodes. Thyroid gland, trachea, and esophagus demonstrate no significant findings. Lungs/Pleura: The right middle lobe and left lower lobe are completely collapsed. Mild to moderate severity lingular atelectasis is seen. No pleural effusion or pneumothorax is identified. Upper Abdomen: There is a stable 3.2 cm low-attenuation left adrenal mass (approximately 49.03 Hounsfield units). Musculoskeletal: Compression fracture deformities of indeterminate age are seen at the levels of T6 and T8. This represents a new finding when compared to the prior study. Multilevel degenerative changes are seen throughout the thoracic spine. Review of the MIP images confirms the above findings. IMPRESSION: 1. No evidence of pulmonary embolism. 2. Complete collapse of the right middle lobe and left lower lobe. 3. Mild to moderate severity lingular atelectasis. 4. 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 5. Compression fracture deformities of indeterminate age at the levels of T6 and T8. 6. Aortic atherosclerosis. Aortic Atherosclerosis (ICD10-I70.0). Electronically Signed   By: Aram Candela M.D.   On: 06/15/2023 02:04   DG Chest Port 1 View Result Date: 06/09/2023 CLINICAL DATA:  Shortness of breath EXAM: PORTABLE CHEST 1 VIEW COMPARISON:  05/09/2023 FINDINGS: Right Port-A-Cath remains in place, unchanged. Heart and mediastinal contours are within normal limits. Aortic atherosclerosis. Diffuse interstitial prominence with reticulonodular opacities throughout the lungs, stable since prior study. No effusions or acute bony abnormality. IMPRESSION: Diffuse reticulonodular interstitial prominence throughout the lungs, similar prior study. This could reflect chronic interstitial lung disease or atypical  infection. Electronically Signed   By: Charlett Nose M.D.   On: 06/09/2023 17:31    Micro Results   *** Recent Results (from the past 240 hours)  Resp panel by RT-PCR (RSV, Flu A&B, Covid) Anterior Nasal Swab     Status: Abnormal   Collection Time: 06/09/23  6:34 PM   Specimen: Anterior Nasal Swab  Result Value Ref Range Status   SARS Coronavirus 2 by RT PCR NEGATIVE NEGATIVE Final    Comment: (NOTE) SARS-CoV-2 target nucleic acids are NOT DETECTED.  The SARS-CoV-2 RNA is generally detectable in upper respiratory specimens during the acute phase of infection. The lowest concentration of SARS-CoV-2 viral copies this assay can detect is 138 copies/mL. A negative result does not preclude SARS-Cov-2 infection and should not be used as the sole basis for treatment or other patient management decisions. A negative result may occur with  improper specimen collection/handling, submission of specimen other than nasopharyngeal swab, presence of viral mutation(s) within the areas targeted by this assay, and inadequate number of viral copies(<138 copies/mL). A negative result must be combined with clinical observations, patient history, and epidemiological information. The expected result is Negative.  Fact Sheet for Patients:  BloggerCourse.com  Fact Sheet for Healthcare Providers:  SeriousBroker.it  This test is no t yet approved or cleared by the [place] FDA and  has been authorized for detection and/or diagnosis of SARS-CoV-2 by FDA under an Emergency Use Authorization (EUA). This EUA will remain  in effect (meaning this test can be used) for the duration of the COVID-19 declaration under Section 564(b)(1) of the Act, 21 U.S.C.section 360bbb-3(b)(1), unless the authorization is terminated  or revoked sooner.       Influenza A by  PCR NEGATIVE NEGATIVE Final   Influenza B by PCR NEGATIVE NEGATIVE Final    Comment: (NOTE) The  Xpert Xpress SARS-CoV-2/FLU/RSV plus assay is intended as an aid in the diagnosis of influenza from Nasopharyngeal swab specimens and should not be used as a sole basis for treatment. Nasal washings and aspirates are unacceptable for Xpert Xpress SARS-CoV-2/FLU/RSV testing.  Fact Sheet for Patients: BloggerCourse.com  Fact Sheet for Healthcare Providers: SeriousBroker.it  This test is not yet approved or cleared by the [place] FDA and has been authorized for detection and/or diagnosis of SARS-CoV-2 by FDA under an Emergency Use Authorization (EUA). This EUA will remain in effect (meaning this test can be used) for the duration of the COVID-19 declaration under Section 564(b)(1) of the Act, 21 U.S.C. section 360bbb-3(b)(1), unless the authorization is terminated or revoked.     Resp Syncytial Virus by PCR POSITIVE (A) NEGATIVE Final    Comment: (NOTE) Fact Sheet for Patients: BloggerCourse.com  Fact Sheet for Healthcare Providers: SeriousBroker.it  This test is not yet approved or cleared by the [place] FDA and has been authorized for detection and/or diagnosis of SARS-CoV-2 by FDA under an Emergency Use Authorization (EUA). This EUA will remain in effect (meaning this test can be used) for the duration of the COVID-19 declaration under Section 564(b)(1) of the Act, 21 U.S.C. section 360bbb-3(b)(1), unless the authorization is terminated or revoked.  Performed at [facility], [address], [place], [place] [address]   MRSA Next Gen by PCR, Nasal     Status: None   Collection Time: 06/09/23  8:21 PM   Specimen: Nasal Mucosa; Nasal Swab  Result Value Ref Range Status   MRSA by PCR Next Gen NOT DETECTED NOT DETECTED Final    Comment: (NOTE) The GeneXpert MRSA Assay (FDA approved for NASAL specimens only), is one component of a comprehensive MRSA colonization  surveillance program. It is not intended to diagnose MRSA infection nor to guide or monitor treatment for MRSA infections. Test performance is not FDA approved in patients less than 69 years old. Performed at [facility], [address] [place], [place] [address]     Today   Subjective    [facility] today has no ***          Patient has been seen and examined prior to discharge   Objective   Blood pressure 131/65, pulse 77, temperature 98.7 F (37.1 C), temperature source Oral, resp. rate 19, height 5\' 2"  (1.575 m), weight 127.8 kg, SpO2 91%.   Intake/Output Summary (Last 24 hours) at 06/19/2023 1132 Last data filed at 06/19/2023 0981 Gross per 24 hour  Intake 960 ml  Output --  Net 960 ml    Exam Gen:- Awake Alert, no acute distress *** HEENT:- [place].AT, No sclera icterus Neck-Supple Neck,No JVD,.  Lungs-  CTAB , good air movement bilaterally CV- S1, S2 normal, regular Abd-  +ve B.Sounds, Abd Soft, No tenderness,    Extremity/Skin:- No  edema,   good pulses Psych-affect is appropriate, oriented x3 Neuro-no new focal deficits, no tremors ***   Data Review   CBC w Diff:  Lab Results  Component Value Date   WBC 7.4 06/16/2023   HGB 12.5 06/16/2023   HGB 12.1 05/15/2023   HCT 39.1 06/16/2023   HCT 36.7 05/15/2023   PLT 95 (L) 06/16/2023   PLT 109 (L) 05/15/2023   LYMPHOPCT 27 06/15/2023   MONOPCT 13 06/15/2023   EOSPCT 1 06/15/2023   BASOPCT 0 06/15/2023  CMP:  Lab Results  Component Value Date   NA 137 06/16/2023   NA 146 (H) 05/15/2023   K 3.6 06/16/2023   CL 96 (L) 06/16/2023   CO2 32 06/16/2023   BUN 26 (H) 06/16/2023   BUN 11 05/15/2023   CREATININE 0.57 06/16/2023   CREATININE 0.71 07/29/2015   PROT 7.0 06/09/2023   PROT 7.1 10/29/2019   ALBUMIN 4.1 06/09/2023   ALBUMIN 3.2 (L) 10/29/2019   BILITOT 0.6 06/09/2023   BILITOT 0.2 10/29/2019   ALKPHOS 134 (H) 06/09/2023   AST 36 06/09/2023   ALT 33 06/09/2023  .  Total Discharge  time is about 33 minutes  Shon Hale M.D on 06/19/2023 at 11:32 AM  Go to [email or web address] -  for contact info  [facility] - Office  [phone]

## 2023-06-19 NOTE — Progress Notes (Signed)
 Heart Failure Nurse Navigator Progress Note   Patient had a New TOC appt scheduled at [facility] for 06/20/23. She was hospitalized so appointment was changed to 06/26/23 @ 9:30 AM.  Granddaughter Tawanna Cooler was notified at [facility] of appointment date and time change.  AVS had already been printed for patient but granddaughter has MyChart for her and is aware.  Navigator will sign off at this time. Roxy Horseman, RN, BSN [facility] Heart Failure Navigator Secure Chat Only

## 2023-06-19 NOTE — Progress Notes (Addendum)
 Patient discharged home with instructions given on medications and follow up visits verbalized understanding. Prescriptions sent to Pharmacy of choice documented on AVS. Port-cath deaccessed per MD's orders,catheter intact,patient tolerated procedure. .Patient discharged with oxygen at 3 liters via nasal canula. Accompanied by staff to an awaiting vehicle.

## 2023-06-19 NOTE — Progress Notes (Signed)
 Nurse at bedside,patient is alert and oriented times four.Patient is hard of hearing.Dr Marisa Severin at bedside informing patient of discharge today. Family at bedside. Plan of care on going.

## 2023-06-19 NOTE — Discharge Instructions (Addendum)
 1)Very Low-salt diet advised---Less than 2 gm of Sodium per day advised----ok to use Mrs DASH salt substitute instead of Salt 2)Weigh yourself daily, call if you gain more than 3 pounds in 1 day or more than 5 pounds in 1 week as your diuretic medications may need to be adjusted 3)Limit your Fluid  intake to no more than 60 ounces (1.8 Liters) per day 4)Repeat CBC and BMP Blood Tests around Monday 06/26/23 5)Follow - up with Dr. 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  6) outpatient physical therapy advised

## 2023-06-19 NOTE — Care Management Important Message (Signed)
 Important Message  Patient Details  Name: Savannah Burns MRN: [id number] Date of Birth: [birth year]   Important Message Given:  N/A - LOS <3 / Initial given by admissions     Corey Harold 06/19/2023, 11:30 AM

## 2023-06-19 NOTE — Inpatient Diabetes Management (Signed)
 Inpatient Diabetes Program Recommendations  AACE/ADA: New Consensus Statement on Inpatient Glycemic Control  Target Ranges:  Prepandial:   less than 140 mg/dL      Peak postprandial:   less than 180 mg/dL (1-2 hours)      Critically ill patients:  140 - 180 mg/dL    Latest Reference Range & Units 06/18/23 07:28 06/18/23 12:23 06/18/23 16:05 06/18/23 21:14  Glucose-Capillary 70 - 99 mg/dL 161 (H) 096 (H) 045 (H) 296 (H)   Review of Glycemic Control  Diabetes history: DM2 Outpatient Diabetes medications: Ozempic 0.25 mg Qweek Current orders for Inpatient glycemic control: Semglee 12 units daily, Novolog 0-15 units TID with meals, Novolog 0-5 units at bedtime; Prednisone 60 mg QAM  Inpatient Diabetes Program Recommendations:    Insulin: If steroids are continued, please consider ordering Novolog 3 units TID with meals for meal coverage if patient eats at least 50% of meals.   Thanks, Orlando Penner, RN, MSN, CDCES Diabetes Coordinator Inpatient Diabetes Program [phone] (Team Pager from 8am to 5pm)

## 2023-06-20 ENCOUNTER — Telehealth: Payer: Self-pay | Admitting: *Deleted

## 2023-06-20 ENCOUNTER — Inpatient Hospital Stay (HOSPITAL_COMMUNITY): Admit: 2023-06-20 | Discharge: 2023-06-20 | Disposition: A | Discharge status: Home / Self Care

## 2023-06-20 NOTE — Transitions of Care (Post Inpatient/ED Visit) (Signed)
   06/20/2023  Name: Savannah Burns MRN: [id number] DOB: 07-14-1950  Today's TOC FU Call Status: Today's TOC FU Call Status:: Unsuccessful Call (1st Attempt) Unsuccessful Call (1st Attempt) Date: 06/20/23  Attempted to reach the patient regarding the most recent Inpatient/ED visit.  Follow Up Plan: Additional outreach attempts will be made to reach the patient to complete the Transitions of Care (Post Inpatient/ED visit) call.   Irving Shows [name], BSN RN Care Manager/ Transition of Care [place]/ [facility] [phone]

## 2023-06-21 ENCOUNTER — Telehealth: Payer: Self-pay | Admitting: *Deleted

## 2023-06-21 NOTE — Transitions of Care (Post Inpatient/ED Visit) (Signed)
   06/21/2023  Name: Savannah Burns MRN: [id number] DOB: [birth year]  Today's TOC FU Call Status: Today's TOC FU Call Status:: Unsuccessful Call (2nd Attempt) Unsuccessful Call (2nd Attempt) Date: 06/21/23  Attempted to reach the patient regarding the most recent Inpatient/ED visit.  Follow Up Plan: Additional outreach attempts will be made to reach the patient to complete the Transitions of Care (Post Inpatient/ED visit) call.   Irving Shows [facility], BSN RN Care Manager/ Transition of Care [place]/ [facility] [phone]

## 2023-06-22 ENCOUNTER — Inpatient Hospital Stay: Payer: Medicare PPO | Attending: Hematology

## 2023-06-22 ENCOUNTER — Inpatient Hospital Stay: Payer: Medicare PPO

## 2023-06-23 ENCOUNTER — Telehealth: Payer: Self-pay | Admitting: *Deleted

## 2023-06-23 ENCOUNTER — Telehealth (HOSPITAL_COMMUNITY): Payer: Self-pay

## 2023-06-23 NOTE — Transitions of Care (Post Inpatient/ED Visit) (Signed)
   06/23/2023  Name: Savannah Burns MRN: [id number] DOB: [birth year]  Today's TOC FU Call Status: Today's TOC FU Call Status:: Unsuccessful Call (3rd Attempt) Unsuccessful Call (3rd Attempt) Date: 06/23/23  Attempted to reach the patient regarding the most recent Inpatient/ED visit.  Follow Up Plan: No further outreach attempts will be made at this time. We have been unable to contact the patient.  Irving Shows [facility], BSN RN Care Manager/ Transition of Care [place]/ [facility] [phone]

## 2023-06-23 NOTE — Telephone Encounter (Signed)
 Called to confirm/remind patient of their appointment [facility] on 06[facility]26/2023 .   Appointment:   [] Confirmed  [] Left mess   [x] No answer/No voice mail  [] Phone not in service  Patient reminded to bring all medications and/or complete list.  Confirmed patient has transportation. Gave directions, instructed to utilize valet parking.

## 2023-06-24 DIAGNOSIS — J449 Chronic obstructive pulmonary disease, unspecified: Secondary | ICD-10-CM | POA: Diagnosis not present

## 2023-06-26 ENCOUNTER — Encounter (HOSPITAL_COMMUNITY)

## 2023-06-28 ENCOUNTER — Inpatient Hospital Stay

## 2023-06-28 ENCOUNTER — Inpatient Hospital Stay: Admitting: Hematology

## 2023-06-30 NOTE — Patient Instructions (Signed)
 Visit Information  Thank you for taking time to visit with me today. Please don't hesitate to contact me if I can be of assistance to you.   Following are the goals we discussed today:   Goals Addressed             This Visit's Progress    management of recurrent boils, infection during cancer treatments-Care Coordination Services   Not on track    Care Coordination Goals: Patient will keep all medical appointments Oncology/hematology on 11/16/22 completed Patient will take medication as prescribed IV antibiotics completed Patient will continue to work with Home Health nurse  Patient will talk with Marval Regal, RN Care Coordinator on 12/14/22 completed Patient will reach out to this RN Care Coordinator at [phone] with any resource or care coordination needs prior to that appt Interventions Today    Flowsheet Row Most Recent Value  Chronic Disease   Chronic disease during today's visit Other  [boil on her back]  General Interventions   General Interventions Discussed/Reviewed General Interventions Reviewed, Sick Day Rules, Communication with, Doctor Visits  Doctor Visits Discussed/Reviewed Doctor Visits Reviewed, PCP, Specialist  PCP/Specialist Visits Compliance with follow-up visit  Communication with PCP/Specialists  [sent a secure message to patient's oncology and pcp coverage about her recurrent boils after cancer treatments and reported new scratches after her pets fought near her Pending response]  Education Interventions   Education Provided Provided Education  [infection, some side effects frm cancer treatments - Good and bad bacteria Bacteria overgrowth]  Provided Verbal Education On Medication, When to see the doctor, Sick Day Rules, Other, Mental Health/Coping with Illness  Mental Health Interventions   Mental Health Discussed/Reviewed Mental Health Reviewed, Coping Strategies  Pharmacy Interventions   Pharmacy Dicussed/Reviewed Pharmacy Topics Reviewed, Medications  and their functions, Affording Medications  Safety Interventions   Safety Discussed/Reviewed Safety Reviewed, Home Safety  Home Safety --  [safety risks around her pets while on cancer treatment Skin care regimen while receiveing cancer treatments - [place]]              Our next appointment is by telephone on pending continued monitoring at ?  Please call the care guide team at [phone] if you need to cancel or reschedule your appointment.   If you are experiencing a Mental Health or Behavioral Health Crisis or need someone to talk to, please call the Suicide and Crisis Lifeline: 988 call the [place] National Suicide Prevention Lifeline: (406)497-3535 or TTY: 949-315-8053 TTY 901-643-8171) to talk to a trained counselor call 1-800-273-TALK (toll free, 24 hour hotline) call the [facility]: 252-005-1206 call 911   Patient verbalizes understanding of instructions and care plan provided today and agrees to view in MyChart. Active MyChart status and patient understanding of how to access instructions and care plan via MyChart confirmed with patient.     The patient has been provided with contact information for the care management team and has been advised to call with any health related questions or concerns.   Mekenna Finau L. Noelle Penner, RN, BSN, CCM [place]  [facility], [facility] RN Care Manager Direct Dial: [phone]  Fax: [phone]

## 2023-07-03 NOTE — Progress Notes (Signed)
 [facility] [address], [place] [address]    Clinic Day:  07/04/2023  Referring physician: Raliegh Ip, DO  Patient Care Team: Raliegh Ip, DO as PCP - General (Family Medicine) Wyline Mood Dorothe Pea, MD as PCP - Cardiology (Cardiology) Letta Kocher, MD (Rehabilitation) Coralyn Helling, MD (Inactive) as Consulting Physician (Pulmonary Disease) Lanelle Bal, DO as Consulting Physician (Internal Medicine) Merryl Hacker, NP as Nurse Practitioner (Nurse Practitioner) Bonna Gains, MD as Referring Physician (Gastroenterology) Doreatha Massed, MD as Medical Oncologist (Medical Oncology) Therese Sarah, RN as Oncology Nurse Navigator (Medical Oncology)   ASSESSMENT & PLAN:   Assessment: 1. 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  Orderly maturation of erythroid and myeloid series. - Myeloma FISH panel: t(11;14) - Cytogenetics: Failed to grow metaphases - PET scan (11/18/2021): No evidence of active myeloma or plasmacytoma.  Right upper lobe lung infection. - Skull x-rays (10/29/2021) multiple discrete round lucent/lytic lesions in the skull - Worsening M spike and free light chain ratio.  Normal creatinine and calcium.  Multifactorial anemia including blood loss and bone marrow infiltration. - Cycle 1 of DRd started on 12/14/2021 with lenalidomide 15 mg 3 weeks on/1 week off and dexamethasone 20 mg weekly - Hospitalized from 09/26/2022 through 10/03/2022 with MSSA bacteremia, port removed, cefepime started, to continue until 10/26/2022   2.  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  Quit smoking 15 years ago.  No exposure to chemicals or pesticides. - Paternal grandmother had ovarian cancer.  Sister had ovarian cancer.   3.  Cirrhosis: - Likely secondary to NAFLD with splenomegaly resulting in mild  thrombocytopenia.  Seen by transplant hepatology service at [facility].    Plan: 1.  Stage II IgG kappa myeloma, standard risk: - Last Darzalex treatment on 05/25/2023. - She was hospitalized from 06/09/2023 through 06/13/2023 with acute on chronic respiratory failure. As she was rehospitalized from 06/14/2023 through 06/20/2023 with shortness of breath, chronic respiratory failure with hypoxia.  Treated with steroids and antibiotics.  Doxycycline completed 5 days after discharge.  She is still feeling weak. - She is taking Revlimid 15 mg 3 weeks on/1 week off but it was held during hospitalization. - Labs today: Creatinine grossly normal.  Platelet count is low at 66K, likely from combination of antibiotic and Revlimid.  Hemoglobin was normal. - Will hold Darzalex today. - RTC 4 weeks for follow-up with myeloma labs.   2.  Iron deficiency anemia from chronic GI bleed: - Continue Venofer 500 mg today and every 4 weeks.   3.  Infection prophylaxis: - Continue acyclovir 400 mg twice daily.  Aspirin on hold for bleeding.   4.  Cirrhosis: - She was discharged on Lasix 40 mg daily and to continue carvedilol 6.5 mg daily.   5.  Hypokalemia: - Potassium is 2.7 today.  She will receive IV potassium.  She is taking K-Dur 20 mill equivalents since discharge.  Will increase home potassium to 20 mEq twice daily.    No orders of the defined types were placed in this encounter.      Alben Deeds Burns,acting as a Neurosurgeon for Doreatha Massed, MD.,have documented all relevant documentation on the behalf of Doreatha Massed, MD,as directed by  Doreatha Massed, MD while in the presence of Doreatha Massed, MD.    I, Doreatha Massed MD, have reviewed the above  documentation for accuracy and completeness, and I agree with the above.   Doreatha Massed, MD   4/8/202510:35 AM  CHIEF COMPLAINT:   Diagnosis: stage II standard risk IgG kappa plasma cell myeloma    Cancer Staging   Multiple myeloma (HCC) Staging form: Plasma Cell Myeloma and Plasma Cell Disorders, AJCC 8th Edition - Clinical stage from 11/30/2021: RISS Stage II (Beta-2-microglobulin (mg/L): 2.8, Albumin (g/dL): 3, ISS: Stage II, High-risk cytogenetics: Absent, LDH: Normal) - Signed by Doreatha Massed, MD on 11/30/2021    Prior Therapy: none  Current Therapy:  Daratumumab, lenalidomide and dexamethasone    HISTORY OF PRESENT ILLNESS:   Oncology History  Multiple myeloma (HCC)  11/30/2021 Initial Diagnosis   Multiple myeloma (HCC)   11/30/2021 Cancer Staging   Staging form: Plasma Cell Myeloma and Plasma Cell Disorders, AJCC 8th Edition - Clinical stage from 11/30/2021: RISS Stage II (Beta-2-microglobulin (mg/L): 2.8, Albumin (g/dL): 3, ISS: Stage II, High-risk cytogenetics: Absent, LDH: Normal) - Signed by Doreatha Massed, MD on 11/30/2021 Histopathologic type: Multiple myeloma Stage prefix: Initial diagnosis Beta 2 microglobulin range (mg/L): Less than 3.5 Albumin range (g/dL): Less than 3.5 Cytogenetics: t(11;14) translocation Serum calcium level: Normal   12/14/2021 -  Chemotherapy   Patient is on Treatment Plan : MYELOMA  Daratumumab SQ + Lenalidomide + Dexamethasone (DaraRd) q28d        INTERVAL HISTORY:   Savannah is a 73 y.o. female presenting to clinic today for follow up of stage II standard risk IgG kappa plasma cell myeloma. She was last seen by me on 05/25/23.  Since her last visit, she was hospitalized on 06/09/23 and again on 06/14/23 for respiratory failure. On 3/14 visit, she tested positive for RSV. While hospitalized, she underwent an angio chest CT on 06/15/23 showing: no evidence of PE; complete collapse of RML and LLL; mild to moderate severity lingular atelectasis; stable left adrenal mass, likely an adrenal adenoma; compression deformities of indeterminate age at levels of T6 and T8.  Today, she states that she is doing well overall. Her appetite level is at 60%. Her  energy level is at 0%. Savannah is accompanied by a family member.   Her energy levels are decreased since her hospitalizations. Savannah easily falls asleep. She is s/p doxycycline from RSV and possible pneumonia which she finished on 06/24/23.   Savannah discontinued Revlimid while hospitalized and for 2 days after discharge on 06/19/23. She has since restarted Revlimid.  Savannah states she has been taking Potassium supplements as prescribed, though her lasix dosage has increased to 40 mg daily. She did receive IV potassium while hospitalized as well. She has not yet taken potassium today.   PAST MEDICAL HISTORY:   Past Medical History: Past Medical History:  Diagnosis Date   Adrenal adenoma, left    Stable   Anxiety    Arthritis    bilateral hands   COPD (chronic obstructive pulmonary disease) (HCC)    Depression    Diabetes mellitus, type 2 (HCC) 08/12/2008   Qualifier: Diagnosis of  By: Dayton Martes MD, Talia     Dyspnea    Esophageal varices (HCC)    Grade II diastolic dysfunction    History of kidney stones    Hyperlipidemia    Hypertension    Lower back pain    Lower GI bleed 03/19/2020   Panic attacks    Pneumonia    currently taking antibiotic and prednisone for early stages of pneumonia   Pulmonary nodules    bilateral  Skin cancer    face    Surgical History: Past Surgical History:  Procedure Laterality Date   BIOPSY  04/07/2020   Procedure: BIOPSY;  Surgeon: Lanelle Bal, DO;  [facility]: [facility];  Service: Endoscopy;;   Breast Cystectomy  Right    CESAREAN SECTION     COLONOSCOPY WITH PROPOFOL N/A 01/25/2020   Dr. Marletta Lor: Nonbleeding internal hemorrhoids, diverticulosis, 5 mm polyp removed from the ascending colon, 10 mm polyp removed from the sigmoid colon, 30 mm polyp (tubulovillous adenoma with no high-grade dysplasia) removed from the transverse colon via piecemeal status post tattoo.  Other polyps were tubular adenomas.  3 month surveillance  colonoscopy recommended.   COLONOSCOPY WITH PROPOFOL N/A 04/07/2020   Procedure: COLONOSCOPY WITH PROPOFOL;  Surgeon: Lanelle Bal, DO;  Location: [facility];  Service: Endoscopy;  Laterality: N/A;  3:00pm, pt knows new time per office   CYSTOSCOPY/URETEROSCOPY/HOLMIUM LASER/STENT PLACEMENT Bilateral 03/01/2019   Procedure: CYSTOSCOPY/RETROGRADEURETEROSCOPY/HOLMIUM LASER/STENT PLACEMENT;  Surgeon: Rene Paci, MD;  Location: [facility];  Service: Urology;  Laterality: Bilateral;  ONLY NEEDS 60 MIN   ESOPHAGOGASTRODUODENOSCOPY (EGD) WITH PROPOFOL N/A 01/25/2020   Dr. Marletta Lor: 4 columns grade 1 esophageal varices   ESOPHAGOGASTRODUODENOSCOPY (EGD) WITH PROPOFOL N/A 05/18/2020   Procedure: ESOPHAGOGASTRODUODENOSCOPY (EGD) WITH PROPOFOL;  Surgeon: Lanelle Bal, DO;  [facility];  Service: Endoscopy;  Laterality: N/A;   ESOPHAGOGASTRODUODENOSCOPY (EGD) WITH PROPOFOL N/A 07/28/2020   Procedure: ESOPHAGOGASTRODUODENOSCOPY (EGD) WITH PROPOFOL;  Surgeon: Lanelle Bal, DO;  [facility]: [facility];  Service: Endoscopy;  Laterality: N/A;  am or early PM due to givens capsule placement   GIVENS CAPSULE STUDY N/A 05/18/2020   Procedure: GIVENS CAPSULE STUDY;  Surgeon: Dolores Frame, MD;  [facility]: [facility];  Service: Gastroenterology;  Laterality: N/A;   GIVENS CAPSULE STUDY N/A 07/28/2020   Procedure: GIVENS CAPSULE STUDY;  Surgeon: Lanelle Bal, DO;  [facility];  Service: Endoscopy;  Laterality: N/A;   IR IMAGING GUIDED PORT INSERTION  12/02/2021   IRRIGATION AND DEBRIDEMENT ABSCESS Right 09/28/2022   Procedure: IRRIGATION AND DEBRIDEMENT ABSCESS;  Surgeon: Lewie Chamber, DO;  Location: [facility];  Service: General;  Laterality: Right;   POLYPECTOMY  01/25/2020   Procedure: POLYPECTOMY;  Surgeon: Lanelle Bal, DO;  [facility];  Service: Endoscopy;;   POLYPECTOMY  04/07/2020   Procedure: POLYPECTOMY INTESTINAL;  Surgeon:  Lanelle Bal, DO;  [facility];  Service: Endoscopy;;   PORT-A-CATH REMOVAL Right 09/28/2022   Procedure: MINOR REMOVAL PORT-A-CATH;  Surgeon: Lewie Chamber, DO;  Location: [facility];  Service: General;  Laterality: Right;   PORTACATH PLACEMENT Right 04/12/2023   Procedure: INSERTION PORT-A-CATH, RIJ;  Surgeon: Lewie Chamber, DO;  Location: [facility];  Service: General;  Laterality: Right;   SKIN CANCER EXCISION     Face   SPINE SURGERY     SUBMUCOSAL TATTOO INJECTION  01/25/2020   Procedure: SUBMUCOSAL TATTOO INJECTION;  Surgeon: Lanelle Bal, DO;  [facility];  Service: Endoscopy;;    Social History: Social History   Socioeconomic History   Marital status: Widowed    Spouse name: Not on file   Number of children: 2   Years of education: 14   Highest education level: Not on file  Occupational History   Occupation: Retired   Tobacco Use   Smoking status: Former    Current packs/day: 0.00    Average packs/day: 1.5  packs/day for 40.0 years (60.0 ttl pk-yrs)    Types: Cigarettes    Start date: 04/29/1975    Quit date: 04/29/2015    Years since quitting: 8.1   Smokeless tobacco: Never   Tobacco comments:    Quit smoking 04/2015- Previous 1.5 ppd smoker  Vaping Use   Vaping status: Never Used  Substance and Sexual Activity   Alcohol use: No    Alcohol/week: 0.0 standard drinks of alcohol   Drug use: No   Sexual activity: Not Currently    Birth control/protection: Post-menopausal  Other Topics Concern   Not on file  Social History Narrative   Her 74 year old granddaughter lives with her - one daughter lives nearby, but she doesn't have a good relationship with her. Has a great relationship with other daughter who lives 1.5 hrs away - talks to her daily on the phone.   Social Drivers of Corporate investment banker Strain: Low Risk  (06/12/2023)   Overall Financial Resource Strain (CARDIA)    Difficulty of Paying Living Expenses: Not  very hard  Food Insecurity: No Food Insecurity (06/15/2023)   Hunger Vital Sign    Worried About Running Out of Food in the Last Year: Never true    Ran Out of Food in the Last Year: Never true  Transportation Needs: No Transportation Needs (06/15/2023)   PRAPARE - Administrator, Civil Service (Medical): No    Lack of Transportation (Non-Medical): No  Physical Activity: Inactive (01/27/2023)   Exercise Vital Sign    Days of Exercise per Week: 0 days    Minutes of Exercise per Session: 0 min  Stress: No Stress Concern Present (01/27/2023)   [employer] of Occupational Health - Occupational Stress Questionnaire    Feeling of Stress : Not at all  Social Connections: Socially Isolated (06/15/2023)   Social Connection and Isolation Panel [NHANES]    Frequency of Communication with Friends and Family: More than three times a week    Frequency of Social Gatherings with Friends and Family: More than three times a week    Attends Religious Services: Never    Database administrator or Organizations: No    Attends Banker Meetings: Never    Marital Status: Widowed  Intimate Partner Violence: Not At Risk (06/15/2023)   Humiliation, Afraid, Rape, and Kick questionnaire    Fear of Current or Ex-Partner: No    Emotionally Abused: No    Physically Abused: No    Sexually Abused: No    Family History: Family History  Problem Relation Age of Onset   Diabetes Father    Heart disease Father 51       MI   Hypertension Father    Anemia Mother        Transfusion dependent   COPD Sister    Cancer Paternal Grandmother 68       Pancreatic    Current Medications:  Current Outpatient Medications:    acetaminophen (TYLENOL) 325 MG tablet, Take 2 tablets (650 mg total) by mouth every 6 (six) hours as needed for mild pain (pain score 1-3) (or Fever >/= 101)., Disp: , Rfl:    albuterol (PROVENTIL) (2.5 MG/3ML) 0.083% nebulizer solution, Take 3 mLs (2.5 mg total) by  nebulization every 4 (four) hours as needed for wheezing or shortness of breath., Disp: 75 mL, Rfl: 1   albuterol (VENTOLIN HFA) 108 (90 Base) MCG/ACT inhaler, Inhale 2 puffs into the lungs every 6 (six)  hours as needed for wheezing or shortness of breath., Disp: 8 g, Rfl: 3   carvedilol (COREG) 6.25 MG tablet, Take 1 tablet (6.25 mg total) by mouth 2 (two) times daily with a meal., Disp: 60 tablet, Rfl: 5   desvenlafaxine (PRISTIQ) 100 MG 24 hr tablet, Take 100 mg by mouth in the morning., Disp: , Rfl:    esomeprazole (NEXIUM) 40 MG capsule, Take 1 capsule (40 mg total) by mouth daily., Disp: 30 capsule, Rfl: 2   ferrous sulfate 325 (65 FE) MG tablet, Take 1 tablet (325 mg total) by mouth daily with breakfast., Disp: 30 tablet, Rfl: 3   fluticasone-salmeterol (ADVAIR) 100-50 MCG/ACT AEPB, Inhale 1 puff into the lungs 2 (two) times daily., Disp: 60 each, Rfl: 5   furosemide (LASIX) 40 MG tablet, Take 1 tablet (40 mg total) by mouth daily., Disp: 30 tablet, Rfl: 0   lenalidomide (REVLIMID) 15 MG capsule, TAKE 1 CAPSULE BY MOUTH EVERY DAY FOR 21 DAYS ON THEN 7 DAYS OFF, Disp: 21 capsule, Rfl: 0   lidocaine-prilocaine (EMLA) cream, Apply 1 Application topically as needed. Apply a small amount to port a cath site and cover with plastic wrap 1 hour prior to infusion appointments, Disp: 30 g, Rfl: 3   lovastatin (MEVACOR) 20 MG tablet, Take 1 tablet (20 mg total) by mouth at bedtime., Disp: 90 tablet, Rfl: 3   methocarbamol (ROBAXIN) 750 MG tablet, Take 1 tablet (750 mg total) by mouth 3 (three) times daily., Disp: 90 tablet, Rfl: 1   nystatin (MYCOSTATIN/NYSTOP) powder, Apply topically 2 (two) times daily. (Patient taking differently: Apply 1 Application topically 2 (two) times daily as needed (Yeast).), Disp: 15 g, Rfl: 1   oxyCODONE (OXY IR/ROXICODONE) 5 MG immediate release tablet, Take 1 tablet (5 mg total) by mouth every 8 (eight) hours as needed for severe pain (pain score 7-10)., Disp: 10 tablet, Rfl:  0   OXYGEN, Inhale 3 L into the lungs continuous., Disp: , Rfl:    OZEMPIC, 0.25 OR 0.5 MG/DOSE, 2 MG/3ML SOPN, Inject 0.25 mg into the skin every 7 (seven) days., Disp: 9 mL, Rfl: 3   potassium chloride SA (KLOR-CON M) 20 MEQ tablet, Take 1 tablet (20 mEq total) by mouth 2 (two) times daily., Disp: 60 tablet, Rfl: 3 No current facility-administered medications for this visit.  Facility-Administered Medications Ordered in Other Visits:    iron sucrose (VENOFER) 500 mg in sodium chloride 0.9 % 250 mL IVPB, 500 mg, Intravenous, Once, Doreatha Massed, MD, Last Rate: 110 mL/hr at 07/04/23 0951, 500 mg at 07/04/23 0951   potassium chloride 20 mEq in 100 mL IVPB, 20 mEq, Intravenous, Once, Doreatha Massed, MD   potassium chloride SA (KLOR-CON M) CR tablet 40 mEq, 40 mEq, Oral, Q2H, Doreatha Massed, MD, 40 mEq at 07/04/23 0916   Allergies: Allergies  Allergen Reactions   Keflex [Cephalexin] Nausea And Vomiting    REVIEW OF SYSTEMS:   Review of Systems  Constitutional:  Negative for chills, fatigue and fever.  HENT:   Negative for lump/mass, mouth sores, nosebleeds, sore throat and trouble swallowing.   Eyes:  Negative for eye problems.  Respiratory:  Positive for shortness of breath. Negative for cough.   Cardiovascular:  Negative for chest pain, leg swelling and palpitations.  Gastrointestinal:  Positive for nausea. Negative for abdominal pain, constipation, diarrhea and vomiting.  Genitourinary:  Negative for bladder incontinence, difficulty urinating, dysuria, frequency, hematuria and nocturia.   Musculoskeletal:  Positive for back pain (7/10 severity). Negative  for arthralgias, flank pain, myalgias and neck pain.  Skin:  Negative for itching and rash.  Neurological:  Negative for dizziness, headaches and numbness.  Hematological:  Does not bruise/bleed easily.  Psychiatric/Behavioral:  Positive for depression. Negative for sleep disturbance and suicidal ideas. The patient  is nervous/anxious.   All other systems reviewed and are negative.    VITALS:   Blood pressure 137/84, pulse 85, temperature 97.6 F (36.4 C), resp. rate 20, weight 275 lb 5.7 oz (124.9 kg), SpO2 98%.  Wt Readings from Last 3 Encounters:  07/04/23 275 lb 5.7 oz (124.9 kg)  06/15/23 281 lb 12 oz (127.8 kg)  06/12/23 285 lb 7.9 oz (129.5 kg)    Body mass index is 50.36 kg/m.  Performance status (ECOG): 1 - Symptomatic but completely ambulatory  PHYSICAL EXAM:   Physical Exam Vitals and nursing note reviewed. Exam conducted with a chaperone present.  Constitutional:      Appearance: Normal appearance.  Cardiovascular:     Rate and Rhythm: Normal rate and regular rhythm.     Pulses: Normal pulses.     Heart sounds: Normal heart sounds.  Pulmonary:     Effort: Pulmonary effort is normal.     Breath sounds: Normal breath sounds.  Abdominal:     Palpations: Abdomen is soft. There is no hepatomegaly, splenomegaly or mass.     Tenderness: There is no abdominal tenderness.  Musculoskeletal:     Right lower leg: No edema.     Left lower leg: No edema.  Lymphadenopathy:     Cervical: No cervical adenopathy.     Right cervical: No superficial, deep or posterior cervical adenopathy.    Left cervical: No superficial, deep or posterior cervical adenopathy.     Upper Body:     Right upper body: No supraclavicular or axillary adenopathy.     Left upper body: No supraclavicular or axillary adenopathy.  Neurological:     General: No focal deficit present.     Mental Status: She is alert and oriented to person, place, and time.  Psychiatric:        Mood and Affect: Mood normal.        Behavior: Behavior normal.     LABS:   CBC     Component Value Date/Time   WBC 4.1 07/04/2023 0756   RBC 3.95 07/04/2023 0756   HGB 12.8 07/04/2023 0756   HGB 12.1 05/15/2023 0928   HCT 39.1 07/04/2023 0756   HCT 36.7 05/15/2023 0928   PLT 66 (L) 07/04/2023 0756   PLT 109 (L) 05/15/2023 0928    MCV 99.0 07/04/2023 0756   MCV 101 (H) 05/15/2023 0928   MCH 32.4 07/04/2023 0756   MCHC 32.7 07/04/2023 0756   RDW 13.3 07/04/2023 0756   RDW 13.1 05/15/2023 0928   LYMPHSABS 1.2 07/04/2023 0756   LYMPHSABS 1.8 06/24/2020 1529   MONOABS 0.4 07/04/2023 0756   EOSABS 0.1 07/04/2023 0756   EOSABS 0.1 06/24/2020 1529   BASOSABS 0.0 07/04/2023 0756   BASOSABS 0.0 06/24/2020 1529    CMP      Component Value Date/Time   NA 136 07/04/2023 0756   NA 146 (H) 05/15/2023 0928   K 2.7 (LL) 07/04/2023 0756   CL 90 (L) 07/04/2023 0756   CO2 32 07/04/2023 0756   GLUCOSE 178 (H) 07/04/2023 0756   BUN 10 07/04/2023 0756   BUN 11 05/15/2023 0928   CREATININE 0.69 07/04/2023 0756   CREATININE 0.71 07/29/2015 1110  CALCIUM 8.7 (L) 07/04/2023 0756   CALCIUM 8.5 (L) 02/09/2023 0859   PROT 6.0 (L) 07/04/2023 0756   PROT 7.1 10/29/2019 1644   ALBUMIN 3.1 (L) 07/04/2023 0756   ALBUMIN 3.2 (L) 10/29/2019 1644   AST 26 07/04/2023 0756   ALT 25 07/04/2023 0756   ALKPHOS 143 (H) 07/04/2023 0756   BILITOT 0.8 07/04/2023 0756   BILITOT 0.2 10/29/2019 1644   GFRNONAA >60 07/04/2023 0756   GFRNONAA 102 10/19/2022 0920   GFRNONAA >89 07/29/2015 1110   GFRAA 96 02/07/2020 1415   GFRAA >89 07/29/2015 1110     No results found for: "CEA1", "CEA" / No results found for: "CEA1", "CEA" No results found for: "PSA1" No results found for: "CAN199" No results found for: "CAN125"  Lab Results  Component Value Date   TOTALPROTELP 5.4 (L) 05/25/2023   ALBUMINELP 3.2 05/25/2023   A1GS 0.3 05/25/2023   A2GS 0.7 05/25/2023   BETS 0.8 05/25/2023   GAMS 0.5 05/25/2023   MSPIKE 0.3 (H) 05/25/2023   SPEI Comment 05/25/2023   Lab Results  Component Value Date   TIBC 294 04/13/2023   TIBC 285 02/09/2023   TIBC 323 01/12/2023   FERRITIN 858 (H) 04/13/2023   FERRITIN 578 (H) 02/09/2023   FERRITIN 441 (H) 01/12/2023   IRONPCTSAT 32 (H) 04/13/2023   IRONPCTSAT 136 (H) 02/09/2023   IRONPCTSAT 28  01/12/2023   Lab Results  Component Value Date   LDH 92 (L) 10/22/2021   LDH 101 06/25/2021   LDH 103 03/24/2021     STUDIES:   DG Chest 2 View Result Date: 06/17/2023 CLINICAL DATA:  Shortness of breath.  COPD. EXAM: CHEST - 2 VIEW COMPARISON:  06/09/2023.  CT of 06/17/2023 FINDINGS: Both views are degraded by patient body habitus. Lateral view degraded by patient arm position. Right Port-A-Cath tip low SVC. Midline trachea. Moderate cardiomegaly. Suspect small left pleural effusion. No pneumothorax. Mild pulmonary interstitial prominence. Left lower lobe collapse. IMPRESSION: Cardiomegaly and low lung volumes with probable mild pulmonary venous congestion. Small left pleural effusion with left lower lobe collapse, as on recent CT. Aortic Atherosclerosis (ICD10-I70.0). Electronically Signed   By: Jeronimo Greaves M.D.   On: 06/17/2023 13:29   CT Angio Chest PE W and/or Wo Contrast Result Date: 06/15/2023 CLINICAL DATA:  Back pain with movement. EXAM: CT ANGIOGRAPHY CHEST WITH CONTRAST TECHNIQUE: Multidetector CT imaging of the chest was performed using the standard protocol during bolus administration of intravenous contrast. Multiplanar CT image reconstructions and MIPs were obtained to evaluate the vascular anatomy. RADIATION DOSE REDUCTION: This exam was performed according to the departmental dose-optimization program which includes automated exposure control, adjustment of the mA and/or kV according to patient size and/or use of iterative reconstruction technique. CONTRAST:  75mL OMNIPAQUE IOHEXOL 350 MG/ML SOLN COMPARISON:  September 26, 2022 FINDINGS: Cardiovascular: A right-sided venous Port-A-Cath is seen. There is marked severity calcification of the aortic arch, without evidence of aortic aneurysm. Satisfactory opacification of the pulmonary arteries to the segmental level. No evidence of pulmonary embolism. There is mild cardiomegaly with moderate severity coronary artery calcification. No  pericardial effusion. Mediastinum/Nodes: No enlarged mediastinal, hilar, or axillary lymph nodes. Thyroid gland, trachea, and esophagus demonstrate no significant findings. Lungs/Pleura: The right middle lobe and left lower lobe are completely collapsed. Mild to moderate severity lingular atelectasis is seen. No pleural effusion or pneumothorax is identified. Upper Abdomen: There is a stable 3.2 cm low-attenuation left adrenal mass (approximately 49.03 Hounsfield units). Musculoskeletal: Compression fracture  deformities of indeterminate age are seen at the levels of T6 and T8. This represents a new finding when compared to the prior study. Multilevel degenerative changes are seen throughout the thoracic spine. Review of the MIP images confirms the above findings. IMPRESSION: 1. No evidence of pulmonary embolism. 2. Complete collapse of the right middle lobe and left lower lobe. 3. Mild to moderate severity lingular atelectasis. 4. 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 5. Compression fracture deformities of indeterminate age at the levels of T6 and T8. 6. Aortic atherosclerosis. Aortic Atherosclerosis (ICD10-I70.0). Electronically Signed   By: Aram Candela M.D.   On: 06/15/2023 02:04   DG Chest Port 1 View Result Date: 06/09/2023 CLINICAL DATA:  Shortness of breath EXAM: PORTABLE CHEST 1 VIEW COMPARISON:  05/09/2023 FINDINGS: Right Port-A-Cath remains in place, unchanged. Heart and mediastinal contours are within normal limits. Aortic atherosclerosis. Diffuse interstitial prominence with reticulonodular opacities throughout the lungs, stable since prior study. No effusions or acute bony abnormality. IMPRESSION: Diffuse reticulonodular interstitial prominence throughout the lungs, similar prior study. This could reflect chronic interstitial lung disease or atypical infection. Electronically Signed   By:  Charlett Nose M.D.   On: 06/09/2023 17:31

## 2023-07-04 ENCOUNTER — Inpatient Hospital Stay

## 2023-07-04 ENCOUNTER — Inpatient Hospital Stay: Attending: Hematology

## 2023-07-04 ENCOUNTER — Inpatient Hospital Stay (HOSPITAL_BASED_OUTPATIENT_CLINIC_OR_DEPARTMENT_OTHER): Admitting: Hematology

## 2023-07-04 VITALS — BP 141/80 | HR 90 | Temp 98.8°F | Resp 18

## 2023-07-04 VITALS — BP 137/84 | HR 85 | Temp 97.6°F | Resp 20 | Wt 275.4 lb

## 2023-07-04 DIAGNOSIS — E785 Hyperlipidemia, unspecified: Secondary | ICD-10-CM | POA: Diagnosis not present

## 2023-07-04 DIAGNOSIS — E876 Hypokalemia: Secondary | ICD-10-CM | POA: Diagnosis not present

## 2023-07-04 DIAGNOSIS — Z87442 Personal history of urinary calculi: Secondary | ICD-10-CM | POA: Diagnosis not present

## 2023-07-04 DIAGNOSIS — J90 Pleural effusion, not elsewhere classified: Secondary | ICD-10-CM | POA: Diagnosis not present

## 2023-07-04 DIAGNOSIS — C9000 Multiple myeloma not having achieved remission: Secondary | ICD-10-CM | POA: Insufficient documentation

## 2023-07-04 DIAGNOSIS — Z79899 Other long term (current) drug therapy: Secondary | ICD-10-CM | POA: Diagnosis not present

## 2023-07-04 DIAGNOSIS — D500 Iron deficiency anemia secondary to blood loss (chronic): Secondary | ICD-10-CM | POA: Insufficient documentation

## 2023-07-04 DIAGNOSIS — Z7961 Long term (current) use of immunomodulator: Secondary | ICD-10-CM | POA: Diagnosis not present

## 2023-07-04 DIAGNOSIS — I119 Hypertensive heart disease without heart failure: Secondary | ICD-10-CM | POA: Diagnosis not present

## 2023-07-04 DIAGNOSIS — Z79624 Long term (current) use of inhibitors of nucleotide synthesis: Secondary | ICD-10-CM | POA: Insufficient documentation

## 2023-07-04 DIAGNOSIS — Z7985 Long-term (current) use of injectable non-insulin antidiabetic drugs: Secondary | ICD-10-CM | POA: Diagnosis not present

## 2023-07-04 DIAGNOSIS — Z86018 Personal history of other benign neoplasm: Secondary | ICD-10-CM | POA: Insufficient documentation

## 2023-07-04 DIAGNOSIS — I700 Atherosclerosis of aorta: Secondary | ICD-10-CM | POA: Insufficient documentation

## 2023-07-04 DIAGNOSIS — Z8041 Family history of malignant neoplasm of ovary: Secondary | ICD-10-CM | POA: Insufficient documentation

## 2023-07-04 DIAGNOSIS — E279 Disorder of adrenal gland, unspecified: Secondary | ICD-10-CM | POA: Insufficient documentation

## 2023-07-04 DIAGNOSIS — Z87891 Personal history of nicotine dependence: Secondary | ICD-10-CM | POA: Diagnosis not present

## 2023-07-04 DIAGNOSIS — K7460 Unspecified cirrhosis of liver: Secondary | ICD-10-CM | POA: Diagnosis not present

## 2023-07-04 DIAGNOSIS — J449 Chronic obstructive pulmonary disease, unspecified: Secondary | ICD-10-CM | POA: Insufficient documentation

## 2023-07-04 DIAGNOSIS — R918 Other nonspecific abnormal finding of lung field: Secondary | ICD-10-CM | POA: Insufficient documentation

## 2023-07-04 DIAGNOSIS — Z85828 Personal history of other malignant neoplasm of skin: Secondary | ICD-10-CM | POA: Insufficient documentation

## 2023-07-04 DIAGNOSIS — E119 Type 2 diabetes mellitus without complications: Secondary | ICD-10-CM | POA: Insufficient documentation

## 2023-07-04 LAB — CBC WITH DIFFERENTIAL/PLATELET
Abs Immature Granulocytes: 0.02 10*3/uL (ref 0.00–0.07)
Basophils Absolute: 0 10*3/uL (ref 0.0–0.1)
Basophils Relative: 0 %
Eosinophils Absolute: 0.1 10*3/uL (ref 0.0–0.5)
Eosinophils Relative: 2 %
HCT: 39.1 % (ref 36.0–46.0)
Hemoglobin: 12.8 g/dL (ref 12.0–15.0)
Immature Granulocytes: 1 %
Lymphocytes Relative: 30 %
Lymphs Abs: 1.2 10*3/uL (ref 0.7–4.0)
MCH: 32.4 pg (ref 26.0–34.0)
MCHC: 32.7 g/dL (ref 30.0–36.0)
MCV: 99 fL (ref 80.0–100.0)
Monocytes Absolute: 0.4 10*3/uL (ref 0.1–1.0)
Monocytes Relative: 10 %
Neutro Abs: 2.3 10*3/uL (ref 1.7–7.7)
Neutrophils Relative %: 57 %
Platelets: 66 10*3/uL — ABNORMAL LOW (ref 150–400)
RBC: 3.95 MIL/uL (ref 3.87–5.11)
RDW: 13.3 % (ref 11.5–15.5)
Smear Review: DECREASED
WBC: 4.1 10*3/uL (ref 4.0–10.5)
nRBC: 0 % (ref 0.0–0.2)

## 2023-07-04 LAB — MAGNESIUM: Magnesium: 1.8 mg/dL (ref 1.7–2.4)

## 2023-07-04 LAB — COMPREHENSIVE METABOLIC PANEL WITH GFR
ALT: 25 U/L (ref 0–44)
AST: 26 U/L (ref 15–41)
Albumin: 3.1 g/dL — ABNORMAL LOW (ref 3.5–5.0)
Alkaline Phosphatase: 143 U/L — ABNORMAL HIGH (ref 38–126)
Anion gap: 14 (ref 5–15)
BUN: 10 mg/dL (ref 8–23)
CO2: 32 mmol/L (ref 22–32)
Calcium: 8.7 mg/dL — ABNORMAL LOW (ref 8.9–10.3)
Chloride: 90 mmol/L — ABNORMAL LOW (ref 98–111)
Creatinine, Ser: 0.69 mg/dL (ref 0.44–1.00)
GFR, Estimated: >60 mL/min (ref >60)
Glucose, Bld: 178 mg/dL — ABNORMAL HIGH (ref 70–99)
Potassium: 2.7 mmol/L — CL (ref 3.5–5.1)
Sodium: 136 mmol/L (ref 135–145)
Total Bilirubin: 0.8 mg/dL (ref 0.0–1.2)
Total Protein: 6 g/dL — ABNORMAL LOW (ref 6.5–8.1)

## 2023-07-04 MED ORDER — SODIUM CHLORIDE 0.9% FLUSH
10.0000 mL | Freq: Once | INTRAVENOUS | Status: AC
  Administered 2023-07-04: 10 mL via INTRAVENOUS

## 2023-07-04 MED ORDER — POTASSIUM CHLORIDE 20 MEQ/100ML IV SOLN
20.0000 meq | Freq: Once | INTRAVENOUS | Status: AC
  Administered 2023-07-04: 20 meq via INTRAVENOUS
  Filled 2023-07-04: qty 100

## 2023-07-04 MED ORDER — POTASSIUM CHLORIDE CRYS ER 20 MEQ PO TBCR: 20.0000 meq | EXTENDED_RELEASE_TABLET | Freq: Two times a day (BID) | ORAL | 3 refills | Status: AC

## 2023-07-04 MED ORDER — POTASSIUM CHLORIDE CRYS ER 20 MEQ PO TBCR
40.0000 meq | EXTENDED_RELEASE_TABLET | ORAL | Status: AC
  Administered 2023-07-04 (×2): 40 meq via ORAL
  Filled 2023-07-04 (×2): qty 2

## 2023-07-04 MED ORDER — CETIRIZINE HCL 10 MG PO TABS
10.0000 mg | ORAL_TABLET | Freq: Once | ORAL | Status: AC
  Administered 2023-07-04: 10 mg via ORAL
  Filled 2023-07-04: qty 1

## 2023-07-04 MED ORDER — SODIUM CHLORIDE 0.9 % IV SOLN
500.0000 mg | Freq: Once | INTRAVENOUS | Status: AC
  Administered 2023-07-04: 500 mg via INTRAVENOUS
  Filled 2023-07-04: qty 25

## 2023-07-04 MED ORDER — SODIUM CHLORIDE 0.9 % IV SOLN: Freq: Once | INTRAVENOUS | Status: AC

## 2023-07-04 MED ORDER — CYANOCOBALAMIN 1000 MCG/ML IJ SOLN
1000.0000 ug | Freq: Once | INTRAMUSCULAR | Status: AC
  Administered 2023-07-04: 1000 ug via INTRAMUSCULAR
  Filled 2023-07-04: qty 1

## 2023-07-04 MED ORDER — HEPARIN SOD (PORK) LOCK FLUSH 100 UNIT/ML IV SOLN
500.0000 [IU] | Freq: Once | INTRAVENOUS | Status: AC
  Administered 2023-07-04: 500 [IU] via INTRAVENOUS

## 2023-07-04 MED ORDER — SODIUM CHLORIDE 0.9 % IV SOLN: INTRAVENOUS | Status: DC

## 2023-07-04 NOTE — Progress Notes (Signed)
 Patient tolerated therapy with no complaints voiced.  Side effects with management reviewed with understanding verbalized.  Port site clean and dry with no bruising or swelling noted at site.  Good blood return noted before and after administration of therapy.  Band aid applied.  Patient left in satisfactory condition with VSS and no s/s of distress noted.

## 2023-07-04 NOTE — Progress Notes (Signed)
 CRITICAL VALUE ALERT Critical value received:  potassium 2.7. Date of notification:  07-04-2023. Time of notification: 08:40 am. Critical value read back:  Yes.   Nurse who received alert:  B.Chrisangel Eskenazi RN.  MD notified time and response:  Dr. Ellin Saba @ 08:41 am. Patient has follow up appointment with Dr. Ellin Saba. Standing orders followed.

## 2023-07-04 NOTE — Patient Instructions (Signed)

## 2023-07-04 NOTE — Progress Notes (Deleted)
 Patient has been examined by Dr. Ellin Saba. Vital signs and labs have been reviewed by MD - ANC, Creatinine, LFTs, hemoglobin, and platelets are within treatment parameters per M.D. - pt may proceed with treatment.  Primary RN and pharmacy notified.

## 2023-07-04 NOTE — Patient Instructions (Signed)
 [facility] at [facility] Discharge Instructions   You were seen and examined today by Dr. Ellin Saba.  He reviewed the results of your lab work which are mostly normal/stable. Your potassium was very low at 2.7. We will give you IV potassium in the clinic today.   We will proceed with your treatment today.   Return as scheduled.    Thank you for choosing [facility] at [facility] to provide your oncology and hematology care.  To afford each patient quality time with our provider, please arrive at least 15 minutes before your scheduled appointment time.   If you have a lab appointment with the Cancer Center please come in thru the Main Entrance and check in at the main information desk.  You need to re-schedule your appointment should you arrive 10 or more minutes late.  We strive to give you quality time with our providers, and arriving late affects you and other patients whose appointments are after yours.  Also, if you no show three or more times for appointments you may be dismissed from the clinic at the providers discretion.     Again, thank you for choosing [facility].  Our hope is that these requests will decrease the amount of time that you wait before being seen by our physicians.       _____________________________________________________________  Should you have questions after your visit to [facility], please contact our office at [phone] and follow the prompts.  Our office hours are 8:00 a.m. and 4:30 p.m. Monday - Friday.  Please note that voicemails left after 4:00 p.m. may not be returned until the following business day.  We are closed weekends and major holidays.  You do have access to a nurse 24-7, just call the main number to the clinic [phone] and do not press any options, hold on the line and a nurse will answer the phone.    For prescription refill requests, have your pharmacy contact  our office and allow 72 hours.    Due to Covid, you will need to wear a mask upon entering the hospital. If you do not have a mask, a mask will be given to you at the Main Entrance upon arrival. For doctor visits, patients may have 1 support person age 71 or older with them. For treatment visits, patients can not have anyone with them due to social distancing guidelines and our immunocompromised population.

## 2023-07-07 ENCOUNTER — Other Ambulatory Visit: Payer: Self-pay

## 2023-07-07 DIAGNOSIS — C9000 Multiple myeloma not having achieved remission: Secondary | ICD-10-CM

## 2023-07-07 DIAGNOSIS — R918 Other nonspecific abnormal finding of lung field: Secondary | ICD-10-CM | POA: Diagnosis not present

## 2023-07-07 DIAGNOSIS — F1120 Opioid dependence, uncomplicated: Secondary | ICD-10-CM | POA: Diagnosis not present

## 2023-07-07 DIAGNOSIS — E119 Type 2 diabetes mellitus without complications: Secondary | ICD-10-CM | POA: Diagnosis not present

## 2023-07-07 DIAGNOSIS — Z6841 Body Mass Index (BMI) 40.0 and over, adult: Secondary | ICD-10-CM | POA: Diagnosis not present

## 2023-07-07 DIAGNOSIS — K7460 Unspecified cirrhosis of liver: Secondary | ICD-10-CM | POA: Diagnosis not present

## 2023-07-07 DIAGNOSIS — I10 Essential (primary) hypertension: Secondary | ICD-10-CM | POA: Diagnosis not present

## 2023-07-07 DIAGNOSIS — D696 Thrombocytopenia, unspecified: Secondary | ICD-10-CM | POA: Diagnosis not present

## 2023-07-07 DIAGNOSIS — M51369 Other intervertebral disc degeneration, lumbar region without mention of lumbar back pain or lower extremity pain: Secondary | ICD-10-CM | POA: Diagnosis not present

## 2023-07-07 MED ORDER — LENALIDOMIDE 15 MG PO CAPS: ORAL_CAPSULE | ORAL | 0 refills | Status: DC

## 2023-07-07 NOTE — Telephone Encounter (Signed)
 Chart reviewed. Revlimid refilled per last office note with Dr. Ellin Saba.

## 2023-07-11 DIAGNOSIS — Z79899 Other long term (current) drug therapy: Secondary | ICD-10-CM | POA: Diagnosis not present

## 2023-07-18 ENCOUNTER — Inpatient Hospital Stay

## 2023-07-18 VITALS — BP 125/71 | HR 74 | Temp 96.9°F | Resp 20

## 2023-07-18 DIAGNOSIS — E785 Hyperlipidemia, unspecified: Secondary | ICD-10-CM | POA: Diagnosis not present

## 2023-07-18 DIAGNOSIS — D500 Iron deficiency anemia secondary to blood loss (chronic): Secondary | ICD-10-CM

## 2023-07-18 DIAGNOSIS — E876 Hypokalemia: Secondary | ICD-10-CM | POA: Diagnosis not present

## 2023-07-18 DIAGNOSIS — I119 Hypertensive heart disease without heart failure: Secondary | ICD-10-CM | POA: Diagnosis not present

## 2023-07-18 DIAGNOSIS — C9000 Multiple myeloma not having achieved remission: Secondary | ICD-10-CM | POA: Diagnosis not present

## 2023-07-18 DIAGNOSIS — K7460 Unspecified cirrhosis of liver: Secondary | ICD-10-CM | POA: Diagnosis not present

## 2023-07-18 DIAGNOSIS — J449 Chronic obstructive pulmonary disease, unspecified: Secondary | ICD-10-CM | POA: Diagnosis not present

## 2023-07-18 DIAGNOSIS — R918 Other nonspecific abnormal finding of lung field: Secondary | ICD-10-CM | POA: Diagnosis not present

## 2023-07-18 DIAGNOSIS — E119 Type 2 diabetes mellitus without complications: Secondary | ICD-10-CM | POA: Diagnosis not present

## 2023-07-18 MED ORDER — SODIUM CHLORIDE 0.9 % IV SOLN
Freq: Once | INTRAVENOUS | Status: AC
Start: 2023-07-18 — End: 2023-07-18

## 2023-07-18 MED ORDER — SODIUM CHLORIDE 0.9 % IV SOLN
500.0000 mg | Freq: Once | INTRAVENOUS | Status: AC
  Administered 2023-07-18: 500 mg via INTRAVENOUS
  Filled 2023-07-18: qty 20

## 2023-07-18 MED ORDER — SODIUM CHLORIDE 0.9% FLUSH
10.0000 mL | Freq: Once | INTRAVENOUS | Status: AC | PRN
  Administered 2023-07-18: 10 mL

## 2023-07-18 MED ORDER — CETIRIZINE HCL 10 MG PO TABS
10.0000 mg | ORAL_TABLET | Freq: Once | ORAL | Status: AC
Start: 2023-07-18 — End: 2023-07-18
  Administered 2023-07-18: 10 mg via ORAL
  Filled 2023-07-18: qty 1

## 2023-07-18 MED ORDER — HEPARIN SOD (PORK) LOCK FLUSH 100 UNIT/ML IV SOLN
500.0000 [IU] | Freq: Once | INTRAVENOUS | Status: AC | PRN
  Administered 2023-07-18: 500 [IU]

## 2023-07-18 NOTE — Progress Notes (Signed)
 Patient presents today for iron  infusion.  Patient is in satisfactory condition with no new complaints voiced.  Vital signs are stable.  Port flushed with good blood return noted.  We will proceed with infusion per provider orders.    Patient tolerated infusion well with no complaints voiced.  Patient left via wheelchair with granddaughter in stable condition.  Vital signs stable at discharge.  Follow up as scheduled.

## 2023-07-18 NOTE — Patient Instructions (Signed)
 [facility] [place] - A DEPT OF [facility]  Discharge Instructions: Thank you for choosing [facility] to provide your oncology and hematology care.  If you have a lab appointment with the Cancer Center - please note that after April 8th, 2024, all labs will be drawn in the cancer center.  You do not have to check in or register with the main entrance as you have in the past but will complete your check-in in the cancer center.  Wear comfortable clothing and clothing appropriate for easy access to any Portacath or PICC line.   We strive to give you quality time with your provider. You may need to reschedule your appointment if you arrive late (15 or more minutes).  Arriving late affects you and other patients whose appointments are after yours.  Also, if you miss three or more appointments without notifying the office, you may be dismissed from the clinic at the provider's discretion.      For prescription refill requests, have your pharmacy contact our office and allow 72 hours for refills to be completed.    Today you received the following:  Venofer.    Iron Sucrose Injection What is this medication? IRON SUCROSE (EYE ern SOO krose) treats low levels of iron (iron deficiency anemia) in people with kidney disease. Iron is a mineral that plays an important role in making red blood cells, which carry oxygen from your lungs to the rest of your body. This medicine may be used for other purposes; ask your health care provider or pharmacist if you have questions. COMMON BRAND NAME(S): Venofer What should I tell my care team before I take this medication? They need to know if you have any of these conditions: Anemia not caused by low iron levels Heart disease High levels of iron in the blood Kidney disease Liver disease An unusual or allergic reaction to iron, other medications, foods, dyes, or preservatives Pregnant or trying to get  pregnant Breastfeeding How should I use this medication? This medication is infused into a vein. It is given by your care team in a hospital or clinic setting. Talk to your care team about the use of this medication in children. While it may be prescribed for children as young as 2 years for selected conditions, precautions do apply. Overdosage: If you think you have taken too much of this medicine contact a poison control center or emergency room at once. NOTE: This medicine is only for you. Do not share this medicine with others. What if I miss a dose? Keep appointments for follow-up doses. It is important not to miss your dose. Call your care team if you are unable to keep an appointment. What may interact with this medication? Do not take this medication with any of the following: Deferoxamine Dimercaprol Other iron products This medication may also interact with the following: Chloramphenicol Deferasirox This list may not describe all possible interactions. Give your health care provider a list of all the medicines, herbs, non-prescription drugs, or dietary supplements you use. Also tell them if you smoke, drink alcohol, or use illegal drugs. Some items may interact with your medicine. What should I watch for while using this medication? Visit your care team for regular checks on your progress. Tell your care team if your symptoms do not start to get better or if they get worse. You may need blood work done while you are taking this medication. You may need to eat  more foods that contain iron. Talk to your care team. Foods that contain iron include whole grains or cereals, dried fruits, beans, peas, leafy green vegetables, and organ meats (liver, kidney). What side effects may I notice from receiving this medication? Side effects that you should report to your care team as soon as possible: Allergic reactions--skin rash, itching, hives, swelling of the face, lips, tongue, or throat Low  blood pressure--dizziness, feeling faint or lightheaded, blurry vision Shortness of breath Side effects that usually do not require medical attention (report to your care team if they continue or are bothersome): Flushing Headache Joint pain Muscle pain Nausea Pain, redness, or irritation at injection site This list may not describe all possible side effects. Call your doctor for medical advice about side effects. You may report side effects to FDA at 1-800-FDA-1088. Where should I keep my medication? This medication is given in a hospital or clinic. It will not be stored at home. NOTE: This sheet is a summary. It may not cover all possible information. If you have questions about this medicine, talk to your doctor, pharmacist, or health care provider.  2024 Elsevier/Gold Standard (2022-11-02 00:00:00)   To help prevent nausea and vomiting after your treatment, we encourage you to take your nausea medication as directed.  BELOW ARE SYMPTOMS THAT SHOULD BE REPORTED IMMEDIATELY: *FEVER GREATER THAN 100.4 F (38 C) OR HIGHER *CHILLS OR SWEATING *NAUSEA AND VOMITING THAT IS NOT CONTROLLED WITH YOUR NAUSEA MEDICATION *UNUSUAL SHORTNESS OF BREATH *UNUSUAL BRUISING OR BLEEDING *URINARY PROBLEMS (pain or burning when urinating, or frequent urination) *BOWEL PROBLEMS (unusual diarrhea, constipation, pain near the anus) TENDERNESS IN MOUTH AND THROAT WITH OR WITHOUT PRESENCE OF ULCERS (sore throat, sores in mouth, or a toothache) UNUSUAL RASH, SWELLING OR PAIN  UNUSUAL VAGINAL DISCHARGE OR ITCHING   Items with * indicate a potential emergency and should be followed up as soon as possible or go to the Emergency Department if any problems should occur.  Please show the CHEMOTHERAPY ALERT CARD or IMMUNOTHERAPY ALERT CARD at check-in to the Emergency Department and triage nurse.  Should you have questions after your visit or need to cancel or reschedule your appointment, please contact [facility] CAN[facility]  [facility] [place] - A DEPT OF [facility] [phone]  and follow the prompts.  Office hours are 8:00 a.m. to 4:30 p.m. Monday - Friday. Please note that voicemails left after 4:00 p.m. may not be returned until the following business day.  We are closed weekends and major holidays. You have access to a nurse at all times for urgent questions. Please call the main number to the clinic [phone] and follow the prompts.  For any non-urgent questions, you may also contact your provider using MyChart. We now offer e-Visits for anyone 24 and older to request care online for non-urgent symptoms. For details visit [email or web address].   Also download the MyChart app! Go to the app store, search "MyChart", open the app, select [place], and log in with your MyChart username and password.

## 2023-07-20 ENCOUNTER — Inpatient Hospital Stay: Payer: Medicare PPO

## 2023-07-20 ENCOUNTER — Inpatient Hospital Stay: Payer: Medicare PPO | Admitting: Hematology

## 2023-07-25 ENCOUNTER — Inpatient Hospital Stay

## 2023-07-25 DIAGNOSIS — J449 Chronic obstructive pulmonary disease, unspecified: Secondary | ICD-10-CM | POA: Diagnosis not present

## 2023-07-26 ENCOUNTER — Inpatient Hospital Stay

## 2023-07-26 ENCOUNTER — Inpatient Hospital Stay: Admitting: Hematology

## 2023-07-26 VITALS — BP 107/93 | HR 77 | Temp 98.3°F | Resp 18

## 2023-07-26 DIAGNOSIS — R918 Other nonspecific abnormal finding of lung field: Secondary | ICD-10-CM | POA: Diagnosis not present

## 2023-07-26 DIAGNOSIS — E876 Hypokalemia: Secondary | ICD-10-CM | POA: Diagnosis not present

## 2023-07-26 DIAGNOSIS — C9000 Multiple myeloma not having achieved remission: Secondary | ICD-10-CM | POA: Diagnosis not present

## 2023-07-26 DIAGNOSIS — E119 Type 2 diabetes mellitus without complications: Secondary | ICD-10-CM | POA: Diagnosis not present

## 2023-07-26 DIAGNOSIS — K7460 Unspecified cirrhosis of liver: Secondary | ICD-10-CM | POA: Diagnosis not present

## 2023-07-26 DIAGNOSIS — J449 Chronic obstructive pulmonary disease, unspecified: Secondary | ICD-10-CM | POA: Diagnosis not present

## 2023-07-26 DIAGNOSIS — D500 Iron deficiency anemia secondary to blood loss (chronic): Secondary | ICD-10-CM | POA: Diagnosis not present

## 2023-07-26 DIAGNOSIS — Z95828 Presence of other vascular implants and grafts: Secondary | ICD-10-CM

## 2023-07-26 DIAGNOSIS — E785 Hyperlipidemia, unspecified: Secondary | ICD-10-CM | POA: Diagnosis not present

## 2023-07-26 DIAGNOSIS — I119 Hypertensive heart disease without heart failure: Secondary | ICD-10-CM | POA: Diagnosis not present

## 2023-07-26 LAB — CBC WITH DIFFERENTIAL/PLATELET
Abs Immature Granulocytes: 0.02 10*3/uL (ref 0.00–0.07)
Basophils Absolute: 0 10*3/uL (ref 0.0–0.1)
Basophils Relative: 0 %
Eosinophils Absolute: 0 10*3/uL (ref 0.0–0.5)
Eosinophils Relative: 1 %
HCT: 29.8 % — ABNORMAL LOW (ref 36.0–46.0)
Hemoglobin: 9.4 g/dL — ABNORMAL LOW (ref 12.0–15.0)
Immature Granulocytes: 1 %
Lymphocytes Relative: 35 %
Lymphs Abs: 1.3 10*3/uL (ref 0.7–4.0)
MCH: 33.1 pg (ref 26.0–34.0)
MCHC: 31.5 g/dL (ref 30.0–36.0)
MCV: 104.9 fL — ABNORMAL HIGH (ref 80.0–100.0)
Monocytes Absolute: 0.4 10*3/uL (ref 0.1–1.0)
Monocytes Relative: 11 %
Neutro Abs: 1.9 10*3/uL (ref 1.7–7.7)
Neutrophils Relative %: 52 %
Platelets: 103 10*3/uL — ABNORMAL LOW (ref 150–400)
RBC: 2.84 MIL/uL — ABNORMAL LOW (ref 3.87–5.11)
RDW: 16.1 % — ABNORMAL HIGH (ref 11.5–15.5)
WBC: 3.6 10*3/uL — ABNORMAL LOW (ref 4.0–10.5)
nRBC: 0 % (ref 0.0–0.2)

## 2023-07-26 LAB — COMPREHENSIVE METABOLIC PANEL WITH GFR
ALT: 15 U/L (ref 0–44)
AST: 17 U/L (ref 15–41)
Albumin: 3.1 g/dL — ABNORMAL LOW (ref 3.5–5.0)
Alkaline Phosphatase: 127 U/L — ABNORMAL HIGH (ref 38–126)
Anion gap: 10 (ref 5–15)
BUN: 11 mg/dL (ref 8–23)
CO2: 29 mmol/L (ref 22–32)
Calcium: 8.4 mg/dL — ABNORMAL LOW (ref 8.9–10.3)
Chloride: 98 mmol/L (ref 98–111)
Creatinine, Ser: 0.53 mg/dL (ref 0.44–1.00)
GFR, Estimated: >60 mL/min (ref >60)
Glucose, Bld: 139 mg/dL — ABNORMAL HIGH (ref 70–99)
Potassium: 3.4 mmol/L — ABNORMAL LOW (ref 3.5–5.1)
Sodium: 137 mmol/L (ref 135–145)
Total Bilirubin: 0.7 mg/dL (ref 0.0–1.2)
Total Protein: 5.6 g/dL — ABNORMAL LOW (ref 6.5–8.1)

## 2023-07-26 LAB — MAGNESIUM: Magnesium: 1.7 mg/dL (ref 1.7–2.4)

## 2023-07-26 MED ORDER — SODIUM CHLORIDE FLUSH 0.9 % IV SOLN
10.0000 mL | Freq: Once | INTRAVENOUS | Status: AC
  Administered 2023-07-26: 10 mL via INTRAVENOUS
  Filled 2023-07-26: qty 10

## 2023-07-26 MED ORDER — HEPARIN SOD (PORK) LOCK FLUSH 100 UNIT/ML IV SOLN
500.0000 [IU] | Freq: Once | INTRAVENOUS | Status: AC
Start: 2023-07-26 — End: 2023-07-26
  Administered 2023-07-26: 500 [IU] via INTRAVENOUS

## 2023-07-26 NOTE — Progress Notes (Signed)
 Port flushed with good blood return noted. No bruising or swelling at site. Bandaid applied and patient discharged in satisfactory condition. VVS stable with no signs or symptoms of distressed noted.

## 2023-07-27 LAB — KAPPA/LAMBDA LIGHT CHAINS
Kappa free light chain: 20.5 mg/L — ABNORMAL HIGH (ref 3.3–19.4)
Kappa, lambda light chain ratio: 2.07 — ABNORMAL HIGH (ref 0.26–1.65)
Lambda free light chains: 9.9 mg/L (ref 5.7–26.3)

## 2023-07-28 LAB — PROTEIN ELECTROPHORESIS, SERUM
A/G Ratio: 1.1 (ref 0.7–1.7)
Albumin ELP: 2.7 g/dL — ABNORMAL LOW (ref 2.9–4.4)
Alpha-1-Globulin: 0.3 g/dL (ref 0.0–0.4)
Alpha-2-Globulin: 0.7 g/dL (ref 0.4–1.0)
Beta Globulin: 0.7 g/dL (ref 0.7–1.3)
Gamma Globulin: 0.6 g/dL (ref 0.4–1.8)
Globulin, Total: 2.4 g/dL (ref 2.2–3.9)
M-Spike, %: 0.3 g/dL — ABNORMAL HIGH
Total Protein ELP: 5.1 g/dL — ABNORMAL LOW (ref 6.0–8.5)

## 2023-08-01 ENCOUNTER — Inpatient Hospital Stay

## 2023-08-01 ENCOUNTER — Inpatient Hospital Stay: Admitting: Hematology

## 2023-08-01 ENCOUNTER — Inpatient Hospital Stay: Attending: Hematology | Admitting: Hematology

## 2023-08-01 VITALS — BP 93/66 | HR 72 | Temp 98.2°F | Resp 19

## 2023-08-01 VITALS — BP 142/64 | HR 77 | Temp 98.1°F | Resp 18 | Wt 299.2 lb

## 2023-08-01 DIAGNOSIS — D696 Thrombocytopenia, unspecified: Secondary | ICD-10-CM | POA: Insufficient documentation

## 2023-08-01 DIAGNOSIS — J9611 Chronic respiratory failure with hypoxia: Secondary | ICD-10-CM | POA: Insufficient documentation

## 2023-08-01 DIAGNOSIS — Z8041 Family history of malignant neoplasm of ovary: Secondary | ICD-10-CM | POA: Diagnosis not present

## 2023-08-01 DIAGNOSIS — D500 Iron deficiency anemia secondary to blood loss (chronic): Secondary | ICD-10-CM

## 2023-08-01 DIAGNOSIS — C9000 Multiple myeloma not having achieved remission: Secondary | ICD-10-CM

## 2023-08-01 DIAGNOSIS — E876 Hypokalemia: Secondary | ICD-10-CM

## 2023-08-01 DIAGNOSIS — E785 Hyperlipidemia, unspecified: Secondary | ICD-10-CM | POA: Diagnosis not present

## 2023-08-01 DIAGNOSIS — Z79899 Other long term (current) drug therapy: Secondary | ICD-10-CM | POA: Diagnosis not present

## 2023-08-01 DIAGNOSIS — Z86018 Personal history of other benign neoplasm: Secondary | ICD-10-CM | POA: Diagnosis not present

## 2023-08-01 DIAGNOSIS — R161 Splenomegaly, not elsewhere classified: Secondary | ICD-10-CM | POA: Diagnosis not present

## 2023-08-01 DIAGNOSIS — I5032 Chronic diastolic (congestive) heart failure: Secondary | ICD-10-CM | POA: Diagnosis not present

## 2023-08-01 DIAGNOSIS — Z7985 Long-term (current) use of injectable non-insulin antidiabetic drugs: Secondary | ICD-10-CM | POA: Insufficient documentation

## 2023-08-01 DIAGNOSIS — Z7961 Long term (current) use of immunomodulator: Secondary | ICD-10-CM | POA: Insufficient documentation

## 2023-08-01 DIAGNOSIS — J449 Chronic obstructive pulmonary disease, unspecified: Secondary | ICD-10-CM | POA: Insufficient documentation

## 2023-08-01 DIAGNOSIS — K760 Fatty (change of) liver, not elsewhere classified: Secondary | ICD-10-CM | POA: Diagnosis not present

## 2023-08-01 DIAGNOSIS — Z87891 Personal history of nicotine dependence: Secondary | ICD-10-CM | POA: Diagnosis not present

## 2023-08-01 DIAGNOSIS — Z87442 Personal history of urinary calculi: Secondary | ICD-10-CM | POA: Insufficient documentation

## 2023-08-01 DIAGNOSIS — I10 Essential (primary) hypertension: Secondary | ICD-10-CM | POA: Diagnosis not present

## 2023-08-01 DIAGNOSIS — Z5111 Encounter for antineoplastic chemotherapy: Secondary | ICD-10-CM | POA: Diagnosis not present

## 2023-08-01 DIAGNOSIS — Z85828 Personal history of other malignant neoplasm of skin: Secondary | ICD-10-CM | POA: Diagnosis not present

## 2023-08-01 MED ORDER — DIPHENHYDRAMINE HCL 25 MG PO CAPS
25.0000 mg | ORAL_CAPSULE | Freq: Once | ORAL | Status: AC
Start: 2023-08-01 — End: 2023-08-01
  Administered 2023-08-01: 25 mg via ORAL
  Filled 2023-08-01: qty 1

## 2023-08-01 MED ORDER — CYANOCOBALAMIN 1000 MCG/ML IJ SOLN
1000.0000 ug | Freq: Once | INTRAMUSCULAR | Status: AC
  Administered 2023-08-01: 1000 ug via INTRAMUSCULAR
  Filled 2023-08-01: qty 1

## 2023-08-01 MED ORDER — IRON SUCROSE 20 MG/ML IV SOLN
500.0000 mg | Freq: Once | INTRAVENOUS | Status: AC
  Administered 2023-08-01: 500 mg via INTRAVENOUS
  Filled 2023-08-01: qty 25

## 2023-08-01 MED ORDER — METOLAZONE 2.5 MG PO TABS: 2.5000 mg | ORAL_TABLET | Freq: Every day | ORAL | 0 refills | Status: AC | PRN

## 2023-08-01 MED ORDER — SODIUM CHLORIDE 0.9 % IV SOLN: Freq: Once | INTRAVENOUS | Status: AC

## 2023-08-01 MED ORDER — FUROSEMIDE 10 MG/ML IJ SOLN
60.0000 mg | Freq: Once | INTRAMUSCULAR | Status: AC
  Administered 2023-08-01: 60 mg via INTRAVENOUS
  Filled 2023-08-01: qty 6

## 2023-08-01 MED ORDER — POTASSIUM CHLORIDE CRYS ER 20 MEQ PO TBCR
40.0000 meq | EXTENDED_RELEASE_TABLET | Freq: Once | ORAL | Status: AC
  Administered 2023-08-01: 40 meq via ORAL
  Filled 2023-08-01: qty 2

## 2023-08-01 MED ORDER — HEPARIN SOD (PORK) LOCK FLUSH 100 UNIT/ML IV SOLN
500.0000 [IU] | Freq: Once | INTRAVENOUS | Status: AC | PRN
Start: 2023-08-01 — End: 2023-08-01
  Administered 2023-08-01: 500 [IU]

## 2023-08-01 MED ORDER — BUMETANIDE 1 MG PO TABS: 2.0000 mg | ORAL_TABLET | Freq: Every day | ORAL | 2 refills | Status: DC

## 2023-08-01 MED ORDER — ACETAMINOPHEN 325 MG PO TABS
650.0000 mg | ORAL_TABLET | Freq: Once | ORAL | Status: AC
  Administered 2023-08-01: 650 mg via ORAL
  Filled 2023-08-01: qty 2

## 2023-08-01 MED ORDER — SODIUM CHLORIDE 0.9% FLUSH
10.0000 mL | Freq: Once | INTRAVENOUS | Status: AC | PRN
  Administered 2023-08-01: 10 mL

## 2023-08-01 MED ORDER — DARATUMUMAB-HYALURONIDASE-FIHJ 1800-30000 MG-UT/15ML ~~LOC~~ SOLN
1800.0000 mg | Freq: Once | SUBCUTANEOUS | Status: AC
  Administered 2023-08-01: 1800 mg via SUBCUTANEOUS
  Filled 2023-08-01: qty 15

## 2023-08-01 NOTE — Progress Notes (Signed)
 [facility] [address], [place] [address]    Clinic Day:  08/01/2023  Referring physician: Eliodoro Guerin, DO  Patient Care Team: Eliodoro Guerin, DO as PCP - General (Family Medicine) Amanda Jungling Joyceann No, MD as PCP - Cardiology (Cardiology) Alejandra Amos, MD (Rehabilitation) Wilder Handy, MD (Inactive) as Consulting Physician (Pulmonary Disease) Vinetta Greening, DO as Consulting Physician (Internal Medicine) Grossman, Janelle, NP as Nurse Practitioner (Nurse Practitioner) Mardella Shadow, MD as Referring Physician (Gastroenterology) Paulett Boros, MD as Medical Oncologist (Medical Oncology) Gerhard Knuckles, RN as Oncology Nurse Navigator (Medical Oncology)   ASSESSMENT & PLAN:   Assessment: 1. 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  Orderly maturation of erythroid and myeloid series. - Myeloma FISH panel: t(11;14) - Cytogenetics: Failed to grow metaphases - PET scan (11/18/2021): No evidence of active myeloma or plasmacytoma.  Right upper lobe lung infection. - Skull x-rays (10/29/2021) multiple discrete round lucent/lytic lesions in the skull - Worsening M spike and free light chain ratio.  Normal creatinine and calcium .  Multifactorial anemia including blood loss and bone marrow infiltration. - Cycle 1 of DRd started on 12/14/2021 with lenalidomide  15 mg 3 weeks on/1 week off and dexamethasone  20 mg weekly - Hospitalized from 09/26/2022 through 10/03/2022 with MSSA bacteremia, port removed, cefepime started, to continue until 10/26/2022   2.  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  Quit smoking 15 years ago.  No exposure to chemicals or pesticides. - Paternal grandmother had ovarian cancer.  Sister had ovarian cancer.   3.  Cirrhosis: - Likely secondary to NAFLD with splenomegaly resulting in mild  thrombocytopenia.  Seen by transplant hepatology service at [facility].    Plan: 1.  Stage II IgG kappa myeloma, standard risk: - Last Darzalex  on 05/25/2023. - Hospitalization from 06/09/2023 through 06/13/2023 with acute on chronic respiratory failure.  Rehospitalized from 06/14/2023 through 06/20/2018 with shortness of breath, chronic respiratory failure with hypoxia. - She is continuing Revlimid  15 mg 3 weeks on/1 week off. - Reviewed myeloma labs from 07/26/2023: M spike is stable at 0.3 g.  FLC ratio is also stable at 2.07 with kappa light chains 20.5.  Creatinine and calcium  are normal. - Recommend continuing Revlimid  15 mg 3 weeks on/1 week off.  Recommend restarting Darzalex  today.  RTC 4 weeks for follow-up.   2.  Iron  deficiency anemia from chronic GI bleed: - Latest hemoglobin is 9.4, down from 12.8.  Will change Venofer  to 500 mg every 2 weeks as standing order.   3.  Infection prophylaxis: - Continue acyclovir  400 mg twice daily.  Aspirin  on hold for bleeding.   4.  Cirrhosis: - She reports that she is taking Lasix  60 to 80 mg/day without any significant results.  She has gained back to her prehospitalization weight. - Will discontinue p.o. Lasix . - Will start her on Bumex 2 mg daily in the mornings. - Have also sent prescription for metolazone 2.5 mg daily as needed.  We will instruct the patient based on the effectiveness of Bumex. - I will give Lasix  60 mg IV in the office today.   5.  Hypokalemia: - Potassium is 3.4 today.  Continue K-Dur 20 mill equivalents 4 times daily.    No orders of the defined types were placed in this encounter.     I,Helena R Teague,acting as a  scribe for Paulett Boros, MD.,have documented all relevant documentation on the behalf of Paulett Boros, MD,as directed by  Paulett Boros, MD while in the presence of Paulett Boros, MD.  I, Paulett Boros MD, have reviewed the above documentation for accuracy and completeness, and  I agree with the above.    Paulett Boros, MD   5/6/20251:02 PM  CHIEF COMPLAINT:   Diagnosis: stage II standard risk IgG kappa plasma cell myeloma    Cancer Staging  Multiple myeloma (HCC) Staging form: Plasma Cell Myeloma and Plasma Cell Disorders, AJCC 8th Edition - Clinical stage from 11/30/2021: RISS Stage II (Beta-2 -microglobulin (mg/L): 2.8, Albumin (g/dL): 3, ISS: Stage II, High-risk cytogenetics: Absent, LDH: Normal) - Signed by Paulett Boros, MD on 11/30/2021    Prior Therapy: none  Current Therapy:  Daratumumab , lenalidomide  and dexamethasone     HISTORY OF PRESENT ILLNESS:   Oncology History  Multiple myeloma (HCC)  11/30/2021 Initial Diagnosis   Multiple myeloma (HCC)   11/30/2021 Cancer Staging   Staging form: Plasma Cell Myeloma and Plasma Cell Disorders, AJCC 8th Edition - Clinical stage from 11/30/2021: RISS Stage II (Beta-2 -microglobulin (mg/L): 2.8, Albumin (g/dL): 3, ISS: Stage II, High-risk cytogenetics: Absent, LDH: Normal) - Signed by Paulett Boros, MD on 11/30/2021 Histopathologic type: Multiple myeloma Stage prefix: Initial diagnosis Beta 2 microglobulin range (mg/L): Less than 3.5 Albumin range (g/dL): Less than 3.5 Cytogenetics: t(11;14) translocation Serum calcium  level: Normal   12/14/2021 -  Chemotherapy   Patient is on Treatment Plan : MYELOMA  Daratumumab  SQ + Lenalidomide  + Dexamethasone  (DaraRd) q28d        INTERVAL HISTORY:   Savannah Burns  is a 73 y.o. female presenting to clinic today for follow up of stage II standard risk IgG kappa plasma cell myeloma. She was last seen by me on 07/04/23.  Today, she states that she is doing well overall. Her appetite level is at 100%. Her energy level is at 50%. She is accompanied by a family member.    Savannah Burns  states she was given IV lasix  while hospitalized and lost 20-25 pounds while there she attributes to loss of fluids. Her SOB and cough improved shortly after this. Since her discharge, she  has been taking 60-100 mg of furosemide  daily and states she gained back her weight due to fluid retention, with associated worsening of SOB and cough. Her cough is worsened at night. She does not believe furosemide  is effective. She denies any BRBPR or melena.   Sosie  takes 4 tablets of 20 mEq potassium daily. She ran out of Coreg  and does not believe she has refills of this prescription. She is taking Revlimid  as prescribed, except while hospitalized.   PAST MEDICAL HISTORY:   Past Medical History: Past Medical History:  Diagnosis Date   Adrenal adenoma, left    Stable   Anxiety    Arthritis    bilateral hands   COPD (chronic obstructive pulmonary disease) (HCC)    Depression    Diabetes mellitus, type 2 (HCC) 08/12/2008   Qualifier: Diagnosis of  By: Kirke Pepper MD, Talia     Dyspnea    Esophageal varices (HCC)    Grade II diastolic dysfunction    History of kidney stones    Hyperlipidemia    Hypertension    Lower back pain    Lower GI bleed 03/19/2020   Panic attacks    Pneumonia    currently taking antibiotic and prednisone  for early stages of pneumonia   Pulmonary nodules    bilateral  Skin cancer    face    Surgical History: Past Surgical History:  Procedure Laterality Date   BIOPSY  04/07/2020   Procedure: BIOPSY;  Surgeon: Vinetta Greening, DO;  [facility]: [facility];  Service: Endoscopy;;   Breast Cystectomy  Right    CESAREAN SECTION     COLONOSCOPY WITH PROPOFOL  N/A 01/25/2020   Dr. Mordechai April: Nonbleeding internal hemorrhoids, diverticulosis, 5 mm polyp removed from the ascending colon, 10 mm polyp removed from the sigmoid colon, 30 mm polyp (tubulovillous adenoma with no high-grade dysplasia) removed from the transverse colon via piecemeal status post tattoo.  Other polyps were tubular adenomas.  3 month surveillance colonoscopy recommended.   COLONOSCOPY WITH PROPOFOL  N/A 04/07/2020   Procedure: COLONOSCOPY WITH PROPOFOL ;  Surgeon: Vinetta Greening, DO;   Location: [facility];  Service: Endoscopy;  Laterality: N/A;  3:00pm, pt knows new time per office   CYSTOSCOPY/URETEROSCOPY/HOLMIUM LASER/STENT PLACEMENT Bilateral 03/01/2019   Procedure: CYSTOSCOPY/RETROGRADEURETEROSCOPY/HOLMIUM LASER/STENT PLACEMENT;  Surgeon: Adelbert Homans, MD;  Location: [facility];  Service: Urology;  Laterality: Bilateral;  ONLY NEEDS 60 MIN   ESOPHAGOGASTRODUODENOSCOPY (EGD) WITH PROPOFOL  N/A 01/25/2020   Dr. Mordechai April: 4 columns grade 1 esophageal varices   ESOPHAGOGASTRODUODENOSCOPY (EGD) WITH PROPOFOL  N/A 05/18/2020   Procedure: ESOPHAGOGASTRODUODENOSCOPY (EGD) WITH PROPOFOL ;  Surgeon: Vinetta Greening, DO;  [facility]: [facility];  Service: Endoscopy;  Laterality: N/A;   ESOPHAGOGASTRODUODENOSCOPY (EGD) WITH PROPOFOL  N/A 07/28/2020   Procedure: ESOPHAGOGASTRODUODENOSCOPY (EGD) WITH PROPOFOL ;  Surgeon: Vinetta Greening, DO;  [facility]: [facility];  Service: Endoscopy;  Laterality: N/A;  am or early PM due to givens capsule placement   GIVENS CAPSULE STUDY N/A 05/18/2020   Procedure: GIVENS CAPSULE STUDY;  Surgeon: Urban Garden, MD;  [facility]: [facility];  Service: Gastroenterology;  Laterality: N/A;   GIVENS CAPSULE STUDY N/A 07/28/2020   Procedure: GIVENS CAPSULE STUDY;  Surgeon: Vinetta Greening, DO;  [facility]: [facility];  Service: Endoscopy;  Laterality: N/A;   IR IMAGING GUIDED PORT INSERTION  12/02/2021   IRRIGATION AND DEBRIDEMENT ABSCESS Right 09/28/2022   Procedure: IRRIGATION AND DEBRIDEMENT ABSCESS;  Surgeon: Marijo Shove, DO;  Location: [facility];  Service: General;  Laterality: Right;   POLYPECTOMY  01/25/2020   Procedure: POLYPECTOMY;  Surgeon: Vinetta Greening, DO;  [facility];  Service: Endoscopy;;   POLYPECTOMY  04/07/2020   Procedure: POLYPECTOMY INTESTINAL;  Surgeon: Vinetta Greening, DO;  [facility];  Service: Endoscopy;;   PORT-A-CATH REMOVAL Right 09/28/2022   Procedure: MINOR REMOVAL  PORT-A-CATH;  Surgeon: Marijo Shove, DO;  Location: [facility];  Service: General;  Laterality: Right;   PORTACATH PLACEMENT Right 04/12/2023   Procedure: INSERTION PORT-A-CATH, RIJ;  Surgeon: Marijo Shove, DO;  Location: [facility];  Service: General;  Laterality: Right;   SKIN CANCER EXCISION     Face   SPINE SURGERY     SUBMUCOSAL TATTOO INJECTION  01/25/2020   Procedure: SUBMUCOSAL TATTOO INJECTION;  Surgeon: Vinetta Greening, DO;  [facility]: [facility];  Service: Endoscopy;;    Social History: Social History   Socioeconomic History   Marital status: Widowed    Spouse name: Not on file   Number of children: 2   Years of education: 14   Highest education level: Not on file  Occupational History   Occupation: Retired   Tobacco Use   Smoking status: Former    Current packs/day: 0.00    Average packs/day: 1.5  packs/day for 40.0 years (60.0 ttl pk-yrs)    Types: Cigarettes    Start date: 04/29/1975    Quit date: 04/29/2015    Years since quitting: 8.2   Smokeless tobacco: Never   Tobacco comments:    Quit smoking 04/2015- Previous 1.5 ppd smoker  Vaping Use   Vaping status: Never Used  Substance and Sexual Activity   Alcohol  use: No    Alcohol /week: 0.0 standard drinks of alcohol    Drug use: No   Sexual activity: Not Currently    Birth control/protection: Post-menopausal  Other Topics Concern   Not on file  Social History Narrative   Her 26 year old granddaughter lives with her - one daughter lives nearby, but she doesn't have a good relationship with her. Has a great relationship with other daughter who lives 1.5 hrs away - talks to her daily on the phone.   Social Drivers of Corporate investment banker Strain: Low Risk  (06/12/2023)   Overall Financial Resource Strain (CARDIA)    Difficulty of Paying Living Expenses: Not very hard  Food Insecurity: No Food Insecurity (06/15/2023)   Hunger Vital Sign    Worried About Running Out of Food in the Last Year:  Never true    Ran Out of Food in the Last Year: Never true  Transportation Needs: No Transportation Needs (06/15/2023)   PRAPARE - Administrator, Civil Service (Medical): No    Lack of Transportation (Non-Medical): No  Physical Activity: Inactive (01/27/2023)   Exercise Vital Sign    Days of Exercise per Week: 0 days    Minutes of Exercise per Session: 0 min  Stress: No Stress Concern Present (01/27/2023)   [employer] of Occupational Health - Occupational Stress Questionnaire    Feeling of Stress : Not at all  Social Connections: Socially Isolated (06/15/2023)   Social Connection and Isolation Panel [NHANES]    Frequency of Communication with Friends and Family: More than three times a week    Frequency of Social Gatherings with Friends and Family: More than three times a week    Attends Religious Services: Never    Database administrator or Organizations: No    Attends Banker Meetings: Never    Marital Status: Widowed  Intimate Partner Violence: Not At Risk (06/15/2023)   Humiliation, Afraid, Rape, and Kick questionnaire    Fear of Current or Ex-Partner: No    Emotionally Abused: No    Physically Abused: No    Sexually Abused: No    Family History: Family History  Problem Relation Age of Onset   Diabetes Father    Heart disease Father 58       MI   Hypertension Father    Anemia Mother        Transfusion dependent   COPD Sister    Cancer Paternal Grandmother 12       Pancreatic    Current Medications:  Current Outpatient Medications:    acetaminophen  (TYLENOL ) 325 MG tablet, Take 2 tablets (650 mg total) by mouth every 6 (six) hours as needed for mild pain (pain score 1-3) (or Fever >/= 101)., Disp: , Rfl:    albuterol  (PROVENTIL ) (2.5 MG/3ML) 0.083% nebulizer solution, Take 3 mLs (2.5 mg total) by nebulization every 4 (four) hours as needed for wheezing or shortness of breath., Disp: 75 mL, Rfl: 1   albuterol  (VENTOLIN  HFA) 108 (90 Base)  MCG/ACT inhaler, Inhale 2 puffs into the lungs every 6 (six)  hours as needed for wheezing or shortness of breath., Disp: 8 g, Rfl: 3   bumetanide (BUMEX) 1 MG tablet, Take 2 tablets (2 mg total) by mouth daily., Disp: 60 tablet, Rfl: 2   desvenlafaxine  (PRISTIQ ) 100 MG 24 hr tablet, Take 100 mg by mouth in the morning., Disp: , Rfl:    esomeprazole  (NEXIUM ) 40 MG capsule, Take 1 capsule (40 mg total) by mouth daily., Disp: 30 capsule, Rfl: 2   ferrous sulfate  325 (65 FE) MG tablet, Take 1 tablet (325 mg total) by mouth daily with breakfast., Disp: 30 tablet, Rfl: 3   fluticasone -salmeterol (ADVAIR) 100-50 MCG/ACT AEPB, Inhale 1 puff into the lungs 2 (two) times daily., Disp: 60 each, Rfl: 5   furosemide  (LASIX ) 40 MG tablet, Take 1 tablet (40 mg total) by mouth daily., Disp: 30 tablet, Rfl: 0   lenalidomide  (REVLIMID ) 15 MG capsule, TAKE 1 CAPSULE BY MOUTH EVERY DAY FOR 21 DAYS ON THEN 7 DAYS OFF, Disp: 21 capsule, Rfl: 0   lidocaine -prilocaine  (EMLA ) cream, Apply 1 Application topically as needed. Apply a small amount to port a cath site and cover with plastic wrap 1 hour prior to infusion appointments, Disp: 30 g, Rfl: 3   lovastatin  (MEVACOR ) 20 MG tablet, Take 1 tablet (20 mg total) by mouth at bedtime., Disp: 90 tablet, Rfl: 3   methocarbamol  (ROBAXIN ) 750 MG tablet, Take 1 tablet (750 mg total) by mouth 3 (three) times daily., Disp: 90 tablet, Rfl: 1   metolazone (ZAROXOLYN) 2.5 MG tablet, Take 1 tablet (2.5 mg total) by mouth daily as needed., Disp: 30 tablet, Rfl: 0   nystatin  (MYCOSTATIN /NYSTOP ) powder, Apply topically 2 (two) times daily. (Patient taking differently: Apply 1 Application topically 2 (two) times daily as needed (Yeast).), Disp: 15 g, Rfl: 1   oxyCODONE  (OXY IR/ROXICODONE ) 5 MG immediate release tablet, Take 1 tablet (5 mg total) by mouth every 8 (eight) hours as needed for severe pain (pain score 7-10)., Disp: 10 tablet, Rfl: 0   oxyCODONE -acetaminophen  (PERCOCET) 10-325 MG  tablet, Take 0.5-1 tablets by mouth 4 (four) times daily as needed., Disp: , Rfl:    OXYGEN , Inhale 3 L into the lungs continuous., Disp: , Rfl:    OZEMPIC , 0.25 OR 0.5 MG/DOSE, 2 MG/3ML SOPN, Inject 0.25 mg into the skin every 7 (seven) days., Disp: 9 mL, Rfl: 3   potassium chloride  SA (KLOR-CON  M) 20 MEQ tablet, Take 1 tablet (20 mEq total) by mouth 2 (two) times daily., Disp: 60 tablet, Rfl: 3   carvedilol  (COREG ) 6.25 MG tablet, Take 1 tablet (6.25 mg total) by mouth 2 (two) times daily with a meal., Disp: 60 tablet, Rfl: 5 No current facility-administered medications for this visit.  Facility-Administered Medications Ordered in Other Visits:    heparin  lock flush 100 unit/mL, 500 Units, Intracatheter, Once PRN, Krysia Zahradnik, MD   sodium chloride  flush (NS) 0.9 % injection 10 mL, 10 mL, Intracatheter, Once PRN, Markell Sciascia, MD   Allergies: Allergies  Allergen Reactions   Keflex  [Cephalexin ] Nausea And Vomiting    REVIEW OF SYSTEMS:   Review of Systems  Constitutional:  Negative for chills, fatigue and fever.  HENT:   Negative for lump/mass, mouth sores, nosebleeds, sore throat and trouble swallowing.   Eyes:  Negative for eye problems.  Respiratory:  Positive for cough and shortness of breath.   Cardiovascular:  Negative for chest pain, leg swelling and palpitations.  Gastrointestinal:  Positive for nausea. Negative for abdominal pain, constipation, diarrhea and vomiting.  Genitourinary:  Negative for bladder incontinence, difficulty urinating, dysuria, frequency, hematuria and nocturia.   Musculoskeletal:  Positive for back pain (left side, 3/10 severity). Negative for arthralgias, flank pain, myalgias and neck pain.  Skin:  Negative for itching and rash.  Neurological:  Positive for numbness (in right hand). Negative for dizziness and headaches.  Hematological:  Does not bruise/bleed easily.  Psychiatric/Behavioral:  Negative for depression, sleep disturbance and  suicidal ideas. The patient is not nervous/anxious.   All other systems reviewed and are negative.    VITALS:   Blood pressure (!) 142/64, pulse 77, temperature 98.1 F (36.7 C), temperature source Tympanic, resp. rate 18, weight 299 lb 3.2 oz (135.7 kg), SpO2 98%.  Wt Readings from Last 3 Encounters:  08/01/23 299 lb 3.2 oz (135.7 kg)  07/04/23 275 lb 5.7 oz (124.9 kg)  06/15/23 281 lb 12 oz (127.8 kg)    Body mass index is 54.72 kg/m.  Performance status (ECOG): 1 - Symptomatic but completely ambulatory  PHYSICAL EXAM:   Physical Exam Vitals and nursing note reviewed. Exam conducted with a chaperone present.  Constitutional:      Appearance: Normal appearance.  Cardiovascular:     Rate and Rhythm: Normal rate and regular rhythm.     Pulses: Normal pulses.     Heart sounds: Normal heart sounds.  Pulmonary:     Effort: Pulmonary effort is normal.     Breath sounds: Normal breath sounds.  Abdominal:     Palpations: Abdomen is soft. There is no hepatomegaly, splenomegaly or mass.     Tenderness: There is no abdominal tenderness.  Musculoskeletal:     Right lower leg: No edema.     Left lower leg: No edema.  Lymphadenopathy:     Cervical: No cervical adenopathy.     Right cervical: No superficial, deep or posterior cervical adenopathy.    Left cervical: No superficial, deep or posterior cervical adenopathy.     Upper Body:     Right upper body: No supraclavicular or axillary adenopathy.     Left upper body: No supraclavicular or axillary adenopathy.  Neurological:     General: No focal deficit present.     Mental Status: She is alert and oriented to person, place, and time.  Psychiatric:        Mood and Affect: Mood normal.        Behavior: Behavior normal.     LABS:   CBC     Component Value Date/Time   WBC 3.6 (L) 07/26/2023 1045   RBC 2.84 (L) 07/26/2023 1045   HGB 9.4 (L) 07/26/2023 1045   HGB 12.1 05/15/2023 0928   HCT 29.8 (L) 07/26/2023 1045   HCT  36.7 05/15/2023 0928   PLT 103 (L) 07/26/2023 1045   PLT 109 (L) 05/15/2023 0928   MCV 104.9 (H) 07/26/2023 1045   MCV 101 (H) 05/15/2023 0928   MCH 33.1 07/26/2023 1045   MCHC 31.5 07/26/2023 1045   RDW 16.1 (H) 07/26/2023 1045   RDW 13.1 05/15/2023 0928   LYMPHSABS 1.3 07/26/2023 1045   LYMPHSABS 1.8 06/24/2020 1529   MONOABS 0.4 07/26/2023 1045   EOSABS 0.0 07/26/2023 1045   EOSABS 0.1 06/24/2020 1529   BASOSABS 0.0 07/26/2023 1045   BASOSABS 0.0 06/24/2020 1529    CMP      Component Value Date/Time   NA 137 07/26/2023 1045   NA 146 (H) 05/15/2023 0928   K 3.4 (L) 07/26/2023 1045   CL 98 07/26/2023  1045   CO2 29 07/26/2023 1045   GLUCOSE 139 (H) 07/26/2023 1045   BUN 11 07/26/2023 1045   BUN 11 05/15/2023 0928   CREATININE 0.53 07/26/2023 1045   CREATININE 0.71 07/29/2015 1110   CALCIUM  8.4 (L) 07/26/2023 1045   CALCIUM  8.5 (L) 02/09/2023 0859   PROT 5.6 (L) 07/26/2023 1045   PROT 7.1 10/29/2019 1644   ALBUMIN 3.1 (L) 07/26/2023 1045   ALBUMIN 3.2 (L) 10/29/2019 1644   AST 17 07/26/2023 1045   ALT 15 07/26/2023 1045   ALKPHOS 127 (H) 07/26/2023 1045   BILITOT 0.7 07/26/2023 1045   BILITOT 0.2 10/29/2019 1644   GFRNONAA >60 07/26/2023 1045   GFRNONAA 102 10/19/2022 0920   GFRNONAA >89 07/29/2015 1110   GFRAA 96 02/07/2020 1415   GFRAA >89 07/29/2015 1110     No results found for: "CEA1", "CEA" / No results found for: "CEA1", "CEA" No results found for: "PSA1" No results found for: "CAN199" No results found for: "CAN125"  Lab Results  Component Value Date   TOTALPROTELP 5.1 (L) 07/26/2023   ALBUMINELP 2.7 (L) 07/26/2023   A1GS 0.3 07/26/2023   A2GS 0.7 07/26/2023   BETS 0.7 07/26/2023   GAMS 0.6 07/26/2023   MSPIKE 0.3 (H) 07/26/2023   SPEI Comment 07/26/2023   Lab Results  Component Value Date   TIBC 294 04/13/2023   TIBC 285 02/09/2023   TIBC 323 01/12/2023   FERRITIN 858 (H) 04/13/2023   FERRITIN 578 (H) 02/09/2023   FERRITIN 441 (H)  01/12/2023   IRONPCTSAT 32 (H) 04/13/2023   IRONPCTSAT 136 (H) 02/09/2023   IRONPCTSAT 28 01/12/2023   Lab Results  Component Value Date   LDH 92 (L) 10/22/2021   LDH 101 06/25/2021   LDH 103 03/24/2021     STUDIES:   No results found.

## 2023-08-01 NOTE — Progress Notes (Signed)
 Patient presents today for iron  infusion and Daratumumab  injection.  Patient will also receive Vitamin B12 injection today.  Patient is in satisfactory condition with no new complaints voiced.  Vital signs are stable.  Labs reviewed and all labs are within treatment parameters. Port accessed and good blood return noted.  Patient will receive Lasix  60 mg IV x one dose today per Dr. Cheree Cords.  Patients potassium on 07/26/23 was 3.4 so we will also give Klor Con 40 mEq PO x one dose today per standing orders by Dr. Cheree Cords.  We will proceed with infusions per provider orders.    Patient tolerated treatments and injection well with no complaints voiced.  Patient left via wheelchair with granddaughter in stable condition.  Vital signs stable at discharge.  Follow up as scheduled.

## 2023-08-01 NOTE — Patient Instructions (Signed)
 [facility] - A DEPT OF [facility]  Discharge Instructions: Thank you for choosing [facility] to provide your oncology and hematology care.  If you have a lab appointment with the Cancer Center - please note that after April 8th, 2024, all labs will be drawn in the cancer center.  You do not have to check in or register with the main entrance as you have in the past but will complete your check-in in the cancer center.  Wear comfortable clothing and clothing appropriate for easy access to any Portacath or PICC line.   We strive to give you quality time with your provider. You may need to reschedule your appointment if you arrive late (15 or more minutes).  Arriving late affects you and other patients whose appointments are after yours.  Also, if you miss three or more appointments without notifying the office, you may be dismissed from the clinic at the provider's discretion.      For prescription refill requests, have your pharmacy contact our office and allow 72 hours for refills to be completed.    Today you received the following chemotherapy and/or immunotherapy agents Daratumumab /Venofer /Vitamin B12.   Daratumumab ; Hyaluronidase  Injection What is this medication? DARATUMUMAB ; HYALURONIDASE  (dar a toom ue mab; hye al ur ON i dase) treats multiple myeloma, a type of bone marrow cancer. Daratumumab  works by blocking a protein that causes cancer cells to grow and multiply. This helps to slow or stop the spread of cancer cells. Hyaluronidase  works by increasing the absorption of other medications in the body to help them work better. This medication may also be used treat amyloidosis, a condition that causes the buildup of a protein (amyloid) in your body. It works by reducing the buildup of this protein, which decreases symptoms. It is a combination medication that contains a monoclonal antibody. This medicine may be used for other purposes; ask your  health care provider or pharmacist if you have questions. COMMON BRAND NAME(S): DARZALEX  FASPRO What should I tell my care team before I take this medication? They need to know if you have any of these conditions: Heart disease Infection, such as chickenpox, cold sores, herpes, hepatitis B Lung or breathing disease An unusual or allergic reaction to daratumumab , hyaluronidase , other medications, foods, dyes, or preservatives Pregnant or trying to get pregnant Breast-feeding How should I use this medication? This medication is injected under the skin. It is given by your care team in a hospital or clinic setting. Talk to your care team about the use of this medication in children. Special care may be needed. Overdosage: If you think you have taken too much of this medicine contact a poison control center or emergency room at once. NOTE: This medicine is only for you. Do not share this medicine with others. What if I miss a dose? Keep appointments for follow-up doses. It is important not to miss your dose. Call your care team if you are unable to keep an appointment. What may interact with this medication? Interactions have not been studied. This list may not describe all possible interactions. Give your health care provider a list of all the medicines, herbs, non-prescription drugs, or dietary supplements you use. Also tell them if you smoke, drink alcohol , or use illegal drugs. Some items may interact with your medicine. What should I watch for while using this medication? Your condition will be monitored carefully while you are receiving this medication. This medication can cause  serious allergic reactions. To reduce your risk, your care team may give you other medication to take before receiving this one. Be sure to follow the directions from your care team. This medication can affect the results of blood tests to match your blood type. These changes can last for up to 6 months after the  final dose. Your care team will do blood tests to match your blood type before you start treatment. Tell all of your care team that you are being treated with this medication before receiving a blood transfusion. This medication can affect the results of some tests used to determine treatment response; extra tests may be needed to evaluate response. Talk to your care team if you wish to become pregnant or think you are pregnant. This medication can cause serious birth defects if taken during pregnancy and for 3 months after the last dose. A reliable form of contraception is recommended while taking this medication and for 3 months after the last dose. Talk to your care team about effective forms of contraception. Do not breast-feed while taking this medication. What side effects may I notice from receiving this medication? Side effects that you should report to your care team as soon as possible: Allergic reactions--skin rash, itching, hives, swelling of the face, lips, tongue, or throat Heart rhythm changes--fast or irregular heartbeat, dizziness, feeling faint or lightheaded, chest pain, trouble breathing Infection--fever, chills, cough, sore throat, wounds that don't heal, pain or trouble when passing urine, general feeling of discomfort or being unwell Infusion reactions--chest pain, shortness of breath or trouble breathing, feeling faint or lightheaded Sudden eye pain or change in vision such as blurry vision, seeing halos around lights, vision loss Unusual bruising or bleeding Side effects that usually do not require medical attention (report to your care team if they continue or are bothersome): Constipation Diarrhea Fatigue Nausea Pain, tingling, or numbness in the hands or feet Swelling of the ankles, hands, or feet This list may not describe all possible side effects. Call your doctor for medical advice about side effects. You may report side effects to FDA at 1-800-FDA-1088. Where  should I keep my medication? This medication is given in a hospital or clinic. It will not be stored at home. NOTE: This sheet is a summary. It may not cover all possible information. If you have questions about this medicine, talk to your doctor, pharmacist, or health care provider.  2024 Elsevier/Gold Standard (2021-07-20 00:00:00)   Iron  Sucrose Injection What is this medication? IRON  SUCROSE (EYE ern SOO krose) treats low levels of iron  (iron  deficiency anemia) in people with kidney disease. Iron  is a mineral that plays an important role in making red blood cells, which carry oxygen  from your lungs to the rest of your body. This medicine may be used for other purposes; ask your health care provider or pharmacist if you have questions. COMMON BRAND NAME(S): Venofer  What should I tell my care team before I take this medication? They need to know if you have any of these conditions: Anemia not caused by low iron  levels Heart disease High levels of iron  in the blood Kidney disease Liver disease An unusual or allergic reaction to iron , other medications, foods, dyes, or preservatives Pregnant or trying to get pregnant Breastfeeding How should I use this medication? This medication is infused into a vein. It is given by your care team in a hospital or clinic setting. Talk to your care team about the use of this medication in children. While  it may be prescribed for children as young as 2 years for selected conditions, precautions do apply. Overdosage: If you think you have taken too much of this medicine contact a poison control center or emergency room at once. NOTE: This medicine is only for you. Do not share this medicine with others. What if I miss a dose? Keep appointments for follow-up doses. It is important not to miss your dose. Call your care team if you are unable to keep an appointment. What may interact with this medication? Do not take this medication with any of the  following: Deferoxamine Dimercaprol Other iron  products This medication may also interact with the following: Chloramphenicol Deferasirox This list may not describe all possible interactions. Give your health care provider a list of all the medicines, herbs, non-prescription drugs, or dietary supplements you use. Also tell them if you smoke, drink alcohol , or use illegal drugs. Some items may interact with your medicine. What should I watch for while using this medication? Visit your care team for regular checks on your progress. Tell your care team if your symptoms do not start to get better or if they get worse. You may need blood work done while you are taking this medication. You may need to eat more foods that contain iron . Talk to your care team. Foods that contain iron  include whole grains or cereals, dried fruits, beans, peas, leafy green vegetables, and organ meats (liver, kidney). What side effects may I notice from receiving this medication? Side effects that you should report to your care team as soon as possible: Allergic reactions--skin rash, itching, hives, swelling of the face, lips, tongue, or throat Low blood pressure--dizziness, feeling faint or lightheaded, blurry vision Shortness of breath Side effects that usually do not require medical attention (report to your care team if they continue or are bothersome): Flushing Headache Joint pain Muscle pain Nausea Pain, redness, or irritation at injection site This list may not describe all possible side effects. Call your doctor for medical advice about side effects. You may report side effects to FDA at 1-800-FDA-1088. Where should I keep my medication? This medication is given in a hospital or clinic. It will not be stored at home. NOTE: This sheet is a summary. It may not cover all possible information. If you have questions about this medicine, talk to your doctor, pharmacist, or health care provider.  2024  Elsevier/Gold Standard (2022-11-02 00:00:00)   Vitamin B12 Injection What is this medication? Vitamin B12 (VAHY tuh min B12) prevents and treats low vitamin B12 levels in your body. It is used in people who do not get enough vitamin B12 from their diet or when their digestive tract does not absorb enough. Vitamin B12 plays an important role in maintaining the health of your nervous system and red blood cells. This medicine may be used for other purposes; ask your health care provider or pharmacist if you have questions. COMMON BRAND NAME(S): B-12 Compliance Kit, B-12 Injection Kit, Cyomin, Dodex , LA-12, Nutri-Twelve, Physicians EZ Use B-12, Primabalt, Vitamin Deficiency Injectable System - B12 What should I tell my care team before I take this medication? They need to know if you have any of these conditions: Kidney disease Leber's disease Megaloblastic anemia An unusual or allergic reaction to cyanocobalamin , cobalt, other medications, foods, dyes, or preservatives Pregnant or trying to get pregnant Breast-feeding How should I use this medication? This medication is injected into a muscle or deeply under the skin. It is usually given in a clinic  or care team's office. However, your care team may teach you how to inject yourself. Follow all instructions. Talk to your care team about the use of this medication in children. Special care may be needed. Overdosage: If you think you have taken too much of this medicine contact a poison control center or emergency room at once. NOTE: This medicine is only for you. Do not share this medicine with others. What if I miss a dose? If you are given your dose at a clinic or care team's office, call to reschedule your appointment. If you give your own injections, and you miss a dose, take it as soon as you can. If it is almost time for your next dose, take only that dose. Do not take double or extra doses. What may interact with this  medication? Alcohol  Colchicine This list may not describe all possible interactions. Give your health care provider a list of all the medicines, herbs, non-prescription drugs, or dietary supplements you use. Also tell them if you smoke, drink alcohol , or use illegal drugs. Some items may interact with your medicine. What should I watch for while using this medication? Visit your care team regularly. You may need blood work done while you are taking this medication. You may need to follow a special diet. Talk to your care team. Limit your alcohol  intake and avoid smoking to get the best benefit. What side effects may I notice from receiving this medication? Side effects that you should report to your care team as soon as possible: Allergic reactions--skin rash, itching, hives, swelling of the face, lips, tongue, or throat Swelling of the ankles, hands, or feet Trouble breathing Side effects that usually do not require medical attention (report to your care team if they continue or are bothersome): Diarrhea This list may not describe all possible side effects. Call your doctor for medical advice about side effects. You may report side effects to FDA at 1-800-FDA-1088. Where should I keep my medication? Keep out of the reach of children. Store at room temperature between 15 and 30 degrees C (59 and 85 degrees F). Protect from light. Throw away any unused medication after the expiration date. NOTE: This sheet is a summary. It may not cover all possible information. If you have questions about this medicine, talk to your doctor, pharmacist, or health care provider.  2024 Elsevier/Gold Standard (2020-11-24 00:00:00)       To help prevent nausea and vomiting after your treatment, we encourage you to take your nausea medication as directed.  BELOW ARE SYMPTOMS THAT SHOULD BE REPORTED IMMEDIATELY: *FEVER GREATER THAN 100.4 F (38 C) OR HIGHER *CHILLS OR SWEATING *NAUSEA AND VOMITING THAT IS NOT  CONTROLLED WITH YOUR NAUSEA MEDICATION *UNUSUAL SHORTNESS OF BREATH *UNUSUAL BRUISING OR BLEEDING *URINARY PROBLEMS (pain or burning when urinating, or frequent urination) *BOWEL PROBLEMS (unusual diarrhea, constipation, pain near the anus) TENDERNESS IN MOUTH AND THROAT WITH OR WITHOUT PRESENCE OF ULCERS (sore throat, sores in mouth, or a toothache) UNUSUAL RASH, SWELLING OR PAIN  UNUSUAL VAGINAL DISCHARGE OR ITCHING   Items with * indicate a potential emergency and should be followed up as soon as possible or go to the Emergency Department if any problems should occur.  Please show the CHEMOTHERAPY ALERT CARD or IMMUNOTHERAPY ALERT CARD at check-in to the Emergency Department and triage nurse.  Should you have questions after your visit or need to cancel or reschedule your appointment, please contact [facility] - A DEPT OF  [facility] [phone]  and follow the prompts.  Office hours are 8:00 a.m. to 4:30 p.m. Monday - Friday. Please note that voicemails left after 4:00 p.m. may not be returned until the following business day.  We are closed weekends and major holidays. You have access to a nurse at all times for urgent questions. Please call the main number to the clinic [phone] and follow the prompts.  For any non-urgent questions, you may also contact your provider using MyChart. We now offer e-Visits for anyone 58 and older to request care online for non-urgent symptoms. For details visit [email or web address].   Also download the MyChart app! Go to the app store, search "MyChart", open the app, select [facility], and log in with your MyChart username and password.

## 2023-08-01 NOTE — Patient Instructions (Signed)

## 2023-08-01 NOTE — Progress Notes (Signed)
Patient is taking Revlimid as prescribed.  She has not missed any doses and reports no side effects at this time.   

## 2023-08-08 ENCOUNTER — Inpatient Hospital Stay

## 2023-08-13 ENCOUNTER — Other Ambulatory Visit: Payer: Self-pay | Admitting: Hematology

## 2023-08-13 DIAGNOSIS — C9000 Multiple myeloma not having achieved remission: Secondary | ICD-10-CM

## 2023-08-14 ENCOUNTER — Encounter (HOSPITAL_COMMUNITY): Payer: Self-pay | Admitting: Hematology

## 2023-08-14 ENCOUNTER — Other Ambulatory Visit: Payer: Self-pay | Admitting: *Deleted

## 2023-08-14 ENCOUNTER — Encounter: Payer: Self-pay | Admitting: Hematology

## 2023-08-14 DIAGNOSIS — C9000 Multiple myeloma not having achieved remission: Secondary | ICD-10-CM

## 2023-08-14 MED ORDER — LENALIDOMIDE 15 MG PO CAPS: ORAL_CAPSULE | ORAL | 0 refills | Status: DC

## 2023-08-15 ENCOUNTER — Inpatient Hospital Stay

## 2023-08-17 ENCOUNTER — Inpatient Hospital Stay

## 2023-08-17 ENCOUNTER — Encounter: Payer: Self-pay | Admitting: *Deleted

## 2023-08-17 ENCOUNTER — Telehealth: Payer: Self-pay | Admitting: *Deleted

## 2023-08-17 ENCOUNTER — Telehealth: Payer: Self-pay

## 2023-08-17 DIAGNOSIS — R918 Other nonspecific abnormal finding of lung field: Secondary | ICD-10-CM | POA: Diagnosis not present

## 2023-08-17 DIAGNOSIS — M5136 Other intervertebral disc degeneration, lumbar region with discogenic back pain only: Secondary | ICD-10-CM | POA: Diagnosis not present

## 2023-08-17 DIAGNOSIS — I10 Essential (primary) hypertension: Secondary | ICD-10-CM | POA: Diagnosis not present

## 2023-08-17 DIAGNOSIS — C9000 Multiple myeloma not having achieved remission: Secondary | ICD-10-CM | POA: Diagnosis not present

## 2023-08-17 DIAGNOSIS — F1120 Opioid dependence, uncomplicated: Secondary | ICD-10-CM | POA: Diagnosis not present

## 2023-08-17 DIAGNOSIS — D696 Thrombocytopenia, unspecified: Secondary | ICD-10-CM | POA: Diagnosis not present

## 2023-08-17 DIAGNOSIS — K7460 Unspecified cirrhosis of liver: Secondary | ICD-10-CM | POA: Diagnosis not present

## 2023-08-17 DIAGNOSIS — E119 Type 2 diabetes mellitus without complications: Secondary | ICD-10-CM | POA: Diagnosis not present

## 2023-08-17 DIAGNOSIS — Z6841 Body Mass Index (BMI) 40.0 and over, adult: Secondary | ICD-10-CM | POA: Diagnosis not present

## 2023-08-17 NOTE — Telephone Encounter (Signed)
 Left vm for pt - to get update on Diabetic eye exam - when pt calls back please ask pt if they have had one somewhere and if not please get pt set up on one of our available diabetic eye exam schedules

## 2023-08-22 DIAGNOSIS — Z79899 Other long term (current) drug therapy: Secondary | ICD-10-CM | POA: Diagnosis not present

## 2023-08-24 DIAGNOSIS — J449 Chronic obstructive pulmonary disease, unspecified: Secondary | ICD-10-CM | POA: Diagnosis not present

## 2023-08-29 ENCOUNTER — Inpatient Hospital Stay (HOSPITAL_BASED_OUTPATIENT_CLINIC_OR_DEPARTMENT_OTHER): Admitting: Hematology

## 2023-08-29 ENCOUNTER — Inpatient Hospital Stay

## 2023-08-29 ENCOUNTER — Inpatient Hospital Stay: Attending: Hematology

## 2023-08-29 VITALS — BP 107/80 | HR 78 | Temp 98.9°F | Resp 20

## 2023-08-29 VITALS — BP 105/83 | HR 91 | Temp 98.6°F | Resp 18 | Wt 298.1 lb

## 2023-08-29 DIAGNOSIS — Z85828 Personal history of other malignant neoplasm of skin: Secondary | ICD-10-CM | POA: Diagnosis not present

## 2023-08-29 DIAGNOSIS — Z5111 Encounter for antineoplastic chemotherapy: Secondary | ICD-10-CM | POA: Diagnosis not present

## 2023-08-29 DIAGNOSIS — D500 Iron deficiency anemia secondary to blood loss (chronic): Secondary | ICD-10-CM | POA: Insufficient documentation

## 2023-08-29 DIAGNOSIS — I10 Essential (primary) hypertension: Secondary | ICD-10-CM | POA: Insufficient documentation

## 2023-08-29 DIAGNOSIS — C9000 Multiple myeloma not having achieved remission: Secondary | ICD-10-CM

## 2023-08-29 DIAGNOSIS — J449 Chronic obstructive pulmonary disease, unspecified: Secondary | ICD-10-CM | POA: Insufficient documentation

## 2023-08-29 DIAGNOSIS — Z87442 Personal history of urinary calculi: Secondary | ICD-10-CM | POA: Diagnosis not present

## 2023-08-29 DIAGNOSIS — E119 Type 2 diabetes mellitus without complications: Secondary | ICD-10-CM | POA: Diagnosis not present

## 2023-08-29 DIAGNOSIS — E876 Hypokalemia: Secondary | ICD-10-CM | POA: Diagnosis not present

## 2023-08-29 DIAGNOSIS — Z7985 Long-term (current) use of injectable non-insulin antidiabetic drugs: Secondary | ICD-10-CM | POA: Insufficient documentation

## 2023-08-29 DIAGNOSIS — E785 Hyperlipidemia, unspecified: Secondary | ICD-10-CM | POA: Insufficient documentation

## 2023-08-29 DIAGNOSIS — Z87891 Personal history of nicotine dependence: Secondary | ICD-10-CM | POA: Insufficient documentation

## 2023-08-29 DIAGNOSIS — Z86018 Personal history of other benign neoplasm: Secondary | ICD-10-CM | POA: Diagnosis not present

## 2023-08-29 DIAGNOSIS — Z8041 Family history of malignant neoplasm of ovary: Secondary | ICD-10-CM | POA: Diagnosis not present

## 2023-08-29 DIAGNOSIS — Z7961 Long term (current) use of immunomodulator: Secondary | ICD-10-CM | POA: Insufficient documentation

## 2023-08-29 DIAGNOSIS — R609 Edema, unspecified: Secondary | ICD-10-CM | POA: Diagnosis not present

## 2023-08-29 LAB — CBC WITH DIFFERENTIAL/PLATELET
Abs Immature Granulocytes: 0.01 10*3/uL (ref 0.00–0.07)
Basophils Absolute: 0 10*3/uL (ref 0.0–0.1)
Basophils Relative: 0 %
Eosinophils Absolute: 0 10*3/uL (ref 0.0–0.5)
Eosinophils Relative: 1 %
HCT: 28.9 % — ABNORMAL LOW (ref 36.0–46.0)
Hemoglobin: 8.4 g/dL — ABNORMAL LOW (ref 12.0–15.0)
Immature Granulocytes: 0 %
Lymphocytes Relative: 35 %
Lymphs Abs: 1 10*3/uL (ref 0.7–4.0)
MCH: 30.5 pg (ref 26.0–34.0)
MCHC: 29.1 g/dL — ABNORMAL LOW (ref 30.0–36.0)
MCV: 105.1 fL — ABNORMAL HIGH (ref 80.0–100.0)
Monocytes Absolute: 0.3 10*3/uL (ref 0.1–1.0)
Monocytes Relative: 10 %
Neutro Abs: 1.4 10*3/uL — ABNORMAL LOW (ref 1.7–7.7)
Neutrophils Relative %: 54 %
Platelets: 83 10*3/uL — ABNORMAL LOW (ref 150–400)
RBC: 2.75 MIL/uL — ABNORMAL LOW (ref 3.87–5.11)
RDW: 15.5 % (ref 11.5–15.5)
Smear Review: DECREASED
WBC: 2.7 10*3/uL — ABNORMAL LOW (ref 4.0–10.5)
nRBC: 0 % (ref 0.0–0.2)

## 2023-08-29 LAB — COMPREHENSIVE METABOLIC PANEL WITH GFR
ALT: 12 U/L (ref 0–44)
AST: 20 U/L (ref 15–41)
Albumin: 3.1 g/dL — ABNORMAL LOW (ref 3.5–5.0)
Alkaline Phosphatase: 97 U/L (ref 38–126)
Anion gap: 8 (ref 5–15)
BUN: 12 mg/dL (ref 8–23)
CO2: 26 mmol/L (ref 22–32)
Calcium: 8.1 mg/dL — ABNORMAL LOW (ref 8.9–10.3)
Chloride: 102 mmol/L (ref 98–111)
Creatinine, Ser: 0.59 mg/dL (ref 0.44–1.00)
GFR, Estimated: >60 mL/min (ref >60)
Glucose, Bld: 201 mg/dL — ABNORMAL HIGH (ref 70–99)
Potassium: 3.5 mmol/L (ref 3.5–5.1)
Sodium: 136 mmol/L (ref 135–145)
Total Bilirubin: 0.5 mg/dL (ref 0.0–1.2)
Total Protein: 5.5 g/dL — ABNORMAL LOW (ref 6.5–8.1)

## 2023-08-29 LAB — MAGNESIUM: Magnesium: 1.8 mg/dL (ref 1.7–2.4)

## 2023-08-29 MED ORDER — HEPARIN SOD (PORK) LOCK FLUSH 100 UNIT/ML IV SOLN
500.0000 [IU] | Freq: Once | INTRAVENOUS | Status: AC
  Administered 2023-08-29: 500 [IU] via INTRAVENOUS

## 2023-08-29 MED ORDER — ACETAMINOPHEN 325 MG PO TABS
650.0000 mg | ORAL_TABLET | Freq: Once | ORAL | Status: AC
  Administered 2023-08-29: 650 mg via ORAL
  Filled 2023-08-29: qty 2

## 2023-08-29 MED ORDER — SODIUM CHLORIDE 0.9% FLUSH
10.0000 mL | Freq: Once | INTRAVENOUS | Status: AC
  Administered 2023-08-29: 10 mL via INTRAVENOUS

## 2023-08-29 MED ORDER — DARATUMUMAB-HYALURONIDASE-FIHJ 1800-30000 MG-UT/15ML ~~LOC~~ SOLN
1800.0000 mg | Freq: Once | SUBCUTANEOUS | Status: AC
  Administered 2023-08-29: 1800 mg via SUBCUTANEOUS
  Filled 2023-08-29: qty 15

## 2023-08-29 MED ORDER — SODIUM CHLORIDE 0.9 % IV SOLN
500.0000 mg | Freq: Once | INTRAVENOUS | Status: AC
  Administered 2023-08-29: 500 mg via INTRAVENOUS
  Filled 2023-08-29: qty 25

## 2023-08-29 MED ORDER — DIPHENHYDRAMINE HCL 25 MG PO CAPS
25.0000 mg | ORAL_CAPSULE | Freq: Once | ORAL | Status: AC
  Administered 2023-08-29: 25 mg via ORAL
  Filled 2023-08-29: qty 1

## 2023-08-29 MED ORDER — CYANOCOBALAMIN 1000 MCG/ML IJ SOLN
1000.0000 ug | Freq: Once | INTRAMUSCULAR | Status: AC
  Administered 2023-08-29: 1000 ug via INTRAMUSCULAR
  Filled 2023-08-29: qty 1

## 2023-08-29 MED ORDER — SODIUM CHLORIDE 0.9 % IV SOLN: Freq: Once | INTRAVENOUS | Status: AC

## 2023-08-29 NOTE — Progress Notes (Signed)
 [facility] [address] [place] [address]    Clinic Day:  08/29/2023  Referring physician: Eliodoro Guerin, DO  Patient Care Team: Eliodoro Guerin, DO as PCP - General (Family Medicine) Amanda Jungling Joyceann No, MD as PCP - Cardiology (Cardiology) Alejandra Amos, MD (Rehabilitation) Wilder Handy, MD (Inactive) as Consulting Physician (Pulmonary Disease) Vinetta Greening, DO as Consulting Physician (Internal Medicine) Grossman, Janelle, NP as Nurse Practitioner (Nurse Practitioner) Mardella Shadow, MD as Referring Physician (Gastroenterology) Paulett Boros, MD as Medical Oncologist (Medical Oncology) Gerhard Knuckles, RN as Oncology Nurse Navigator (Medical Oncology)   ASSESSMENT & PLAN:   Assessment: 1. 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  Orderly maturation of erythroid and myeloid series. - Myeloma FISH panel: t(11;14) - Cytogenetics: Failed to grow metaphases - PET scan (11/18/2021): No evidence of active myeloma or plasmacytoma.  Right upper lobe lung infection. - Skull x-rays (10/29/2021) multiple discrete round lucent/lytic lesions in the skull - Worsening M spike and free light chain ratio.  Normal creatinine and calcium .  Multifactorial anemia including blood loss and bone marrow infiltration. - Cycle 1 of DRd started on 12/14/2021 with lenalidomide  15 mg 3 weeks on/1 week off and dexamethasone  20 mg weekly - Hospitalized from 09/26/2022 through 10/03/2022 with MSSA bacteremia, port removed, cefepime started, to continue until 10/26/2022   2.  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  Quit smoking 15 years ago.  No exposure to chemicals or pesticides. - Paternal grandmother had ovarian cancer.  Sister had ovarian cancer.   3.  Cirrhosis: - Likely secondary to NAFLD with splenomegaly resulting in mild  thrombocytopenia.  Seen by [facility] hepatology [facility] at [facility].    Plan: 1.  Stage II IgG kappa myeloma, standard risk: - She is tolerating Darzalex  and Revlimid  reasonably well. - Myeloma labs on 07/26/2023: M spike 0.3 g.  FLC ratio is 2.07, kappa light chains 20.5. - Continue Revlimid  15 mg 3 weeks on/1 week off.  Continue Darzalex  monthly.  Will repeat myeloma labs in 2 weeks and see her back in 4 weeks.   2.  Iron  deficiency anemia from chronic GI bleed: - At last visit we switched her to Venofer  every 2 weeks.  However she missed her dose 2 weeks ago as she was busy with other things.  Today hemoglobin dropped to 8.4 from 9.4.  She will receive Venofer  500 mg today and every 2 weeks.  Will closely monitor her CBC.   3.  Infection prophylaxis: - Continue acyclovir  4 mg twice daily.  Aspirin  on hold due to bleeding.   4.  Cirrhosis: - At last visit we switched her to Bumex  2 mg daily as Lasix  was not helping. - Bumex  helped when she was taking it.  I have also given metolazone  2.5 mg daily to be taken as needed with Bumex . - She had great granddaughter born 2 weeks ago and did not take the Bumex  continuously as prescribed.  She still has fluid in the lower extremities. - She will start back on Bumex  2 mg daily and add metolazone  if needed.   5.  Hypokalemia: - Continue K-Dur 20 mill equivalents 2 tablets twice daily.  Potassium is 3.5 today.    No orders of the defined types were placed in this encounter.     [name],acting as a Neurosurgeon for  Paulett Boros, MD.,have documented all relevant documentation on the behalf of Paulett Boros, MD,as directed by  Paulett Boros, MD while in the presence of Paulett Boros, MD.  I, Paulett Boros MD, have reviewed the above documentation for accuracy and completeness, and I agree with the above.     Paulett Boros, MD   6/3/202512:40 PM  CHIEF COMPLAINT:   Diagnosis: stage II standard risk IgG  kappa plasma cell myeloma    Cancer Staging  Multiple myeloma (HCC) Staging form: Plasma Cell Myeloma and Plasma Cell Disorders, AJCC 8th Edition - Clinical stage from 11/30/2021: RISS Stage II (Beta-2 -microglobulin (mg/L): 2.8, Albumin (g/dL): 3, ISS: Stage II, High-risk cytogenetics: Absent, LDH: Normal) - Signed by Paulett Boros, MD on 11/30/2021    Prior Therapy: none  Current Therapy:  Daratumumab , lenalidomide  and dexamethasone     HISTORY OF PRESENT ILLNESS:   Oncology History  Multiple myeloma (HCC)  11/30/2021 Initial Diagnosis   Multiple myeloma (HCC)   11/30/2021 Cancer Staging   Staging form: Plasma Cell Myeloma and Plasma Cell Disorders, AJCC 8th Edition - Clinical stage from 11/30/2021: RISS Stage II (Beta-2 -microglobulin (mg/L): 2.8, Albumin (g/dL): 3, ISS: Stage II, High-risk cytogenetics: Absent, LDH: Normal) - Signed by Paulett Boros, MD on 11/30/2021 Histopathologic type: Multiple myeloma Stage prefix: Initial diagnosis Beta 2 microglobulin range (mg/L): Less than 3.5 Albumin range (g/dL): Less than 3.5 Cytogenetics: t(11;14) translocation Serum calcium  level: Normal   12/14/2021 -  Chemotherapy   Patient is on Treatment Plan : MYELOMA  Daratumumab  SQ + Lenalidomide  + Dexamethasone  (DaraRd) q28d        INTERVAL HISTORY:   Savannah Burns  is a 73 y.o. female presenting to clinic today for follow up of stage II standard risk IgG kappa plasma cell myeloma. She was last seen by me on 08/01/23.  Today, she states that she is doing well overall. Her appetite level is at 100%. Her energy level is at 75%.   Savannah Burns  states lasix  was not effective when she was taking it. She started Bumex  and discontinued lasix  after her last visit with me, though she took Bumex  for a few weeks before temporarily discontinuing, as she was unable to get to her medications for the past 2 weeks. She has noticed an associated worsening of fluid retention. She denies any BRBPR, melena, or  hematuria.   Savannah Burns  is taking Revlimid  and potassium as prescribed.   PAST MEDICAL HISTORY:   Past Medical History: Past Medical History:  Diagnosis Date   Adrenal adenoma, left    Stable   Anxiety    Arthritis    bilateral hands   COPD (chronic obstructive pulmonary disease) (HCC)    Depression    Diabetes mellitus, type 2 (HCC) 08/12/2008   Qualifier: Diagnosis of  By: Kirke Pepper MD, Talia     Dyspnea    Esophageal varices (HCC)    Grade II diastolic dysfunction    History of kidney stones    Hyperlipidemia    Hypertension    Lower back pain    Lower GI bleed 03/19/2020   Panic attacks    Pneumonia    currently taking antibiotic and prednisone  for early stages of pneumonia   Pulmonary nodules    bilateral   Skin cancer    face    Surgical History: Past Surgical History:  Procedure Laterality Date   BIOPSY  04/07/2020   Procedure: BIOPSY;  Surgeon: Vinetta Greening, DO;  [facility];  Service: Endoscopy;;   Breast Cystectomy  Right  CESAREAN SECTION     COLONOSCOPY WITH PROPOFOL  N/A 01/25/2020   Dr. Mordechai April: Nonbleeding internal hemorrhoids, diverticulosis, 5 mm polyp removed from the ascending colon, 10 mm polyp removed from the sigmoid colon, 30 mm polyp (tubulovillous adenoma with no high-grade dysplasia) removed from the transverse colon via piecemeal status post tattoo.  Other polyps were tubular adenomas.  3 month surveillance colonoscopy recommended.   COLONOSCOPY WITH PROPOFOL  N/A 04/07/2020   Procedure: COLONOSCOPY WITH PROPOFOL ;  Surgeon: Vinetta Greening, DO;  [facility]: [facility];  Service: Endoscopy;  Laterality: N/A;  3:00pm, pt knows new time per office   CYSTOSCOPY/URETEROSCOPY/HOLMIUM LASER/STENT PLACEMENT Bilateral 03/01/2019   Procedure: CYSTOSCOPY/RETROGRADEURETEROSCOPY/HOLMIUM LASER/STENT PLACEMENT;  Surgeon: Adelbert Homans, MD;  Location: [facility];  Service: Urology;  Laterality: Bilateral;  ONLY NEEDS 60 MIN    ESOPHAGOGASTRODUODENOSCOPY (EGD) WITH PROPOFOL  N/A 01/25/2020   Dr. Mordechai April: 4 columns grade 1 esophageal varices   ESOPHAGOGASTRODUODENOSCOPY (EGD) WITH PROPOFOL  N/A 05/18/2020   Procedure: ESOPHAGOGASTRODUODENOSCOPY (EGD) WITH PROPOFOL ;  Surgeon: Vinetta Greening, DO;  [facility];  Service: Endoscopy;  Laterality: N/A;   ESOPHAGOGASTRODUODENOSCOPY (EGD) WITH PROPOFOL  N/A 07/28/2020   Procedure: ESOPHAGOGASTRODUODENOSCOPY (EGD) WITH PROPOFOL ;  Surgeon: Vinetta Greening, DO;  [facility]: [facility];  Service: Endoscopy;  Laterality: N/A;  am or early PM due to givens capsule placement   GIVENS CAPSULE STUDY N/A 05/18/2020   Procedure: GIVENS CAPSULE STUDY;  Surgeon: Urban Garden, MD;  [facility];  Service: Gastroenterology;  Laterality: N/A;   GIVENS CAPSULE STUDY N/A 07/28/2020   Procedure: GIVENS CAPSULE STUDY;  Surgeon: Vinetta Greening, DO;  [facility];  Service: Endoscopy;  Laterality: N/A;   IR IMAGING GUIDED PORT INSERTION  12/02/2021   IRRIGATION AND DEBRIDEMENT ABSCESS Right 09/28/2022   Procedure: IRRIGATION AND DEBRIDEMENT ABSCESS;  Surgeon: Marijo Shove, DO;  Location: [facility];  Service: General;  Laterality: Right;   POLYPECTOMY  01/25/2020   Procedure: POLYPECTOMY;  Surgeon: Vinetta Greening, DO;  [facility];  Service: Endoscopy;;   POLYPECTOMY  04/07/2020   Procedure: POLYPECTOMY INTESTINAL;  Surgeon: Vinetta Greening, DO;  [facility];  Service: Endoscopy;;   PORT-A-CATH REMOVAL Right 09/28/2022   Procedure: MINOR REMOVAL PORT-A-CATH;  Surgeon: Marijo Shove, DO;  Location: [facility];  Service: General;  Laterality: Right;   PORTACATH PLACEMENT Right 04/12/2023   Procedure: INSERTION PORT-A-CATH, RIJ;  Surgeon: Marijo Shove, DO;  Location: [facility];  Service: General;  Laterality: Right;   SKIN CANCER EXCISION     Face   SPINE SURGERY     SUBMUCOSAL TATTOO INJECTION  01/25/2020    Procedure: SUBMUCOSAL TATTOO INJECTION;  Surgeon: Vinetta Greening, DO;  [facility];  Service: Endoscopy;;    Social History: Social History   Socioeconomic History   Marital status: Widowed    Spouse name: Not on file   Number of children: 2   Years of education: 14   Highest education level: Not on file  Occupational History   Occupation: Retired   Tobacco Use   Smoking status: Former    Current packs/day: 0.00    Average packs/day: 1.5 packs/day for 40.0 years (60.0 ttl pk-yrs)    Types: Cigarettes    Start date: 04/29/1975    Quit date: 04/29/2015    Years since quitting: 8.3   Smokeless tobacco: Never   Tobacco comments:    Quit smoking 04/2015- Previous 1.5 ppd smoker  Vaping Use   Vaping status: Never Used  Substance and Sexual Activity   Alcohol  use: No    Alcohol /week: 0.0 standard drinks of alcohol    Drug use: No   Sexual activity: Not Currently    Birth control/protection: Post-menopausal  Other Topics Concern   Not on file  Social History Narrative   Her 40 year old granddaughter lives with her - one daughter lives nearby, but she doesn't have a good relationship with her. Has a great relationship with other daughter who lives 1.5 hrs away - talks to her daily on the phone.   Social Drivers of Corporate investment banker Strain: Low Risk  (06/12/2023)   Overall Financial Resource Strain (CARDIA)    Difficulty of Paying Living Expenses: Not very hard  Food Insecurity: No Food Insecurity (06/15/2023)   Hunger Vital Sign    Worried About Running Out of Food in the Last Year: Never true    Ran Out of Food in the Last Year: Never true  Transportation Needs: No Transportation Needs (06/15/2023)   PRAPARE - Administrator, Civil Service (Medical): No    Lack of Transportation (Non-Medical): No  Physical Activity: Inactive (01/27/2023)   Exercise Vital Sign    Days of Exercise per Week: 0 days    Minutes of Exercise per Session: 0 min   Stress: No Stress Concern Present (01/27/2023)   [employer] of Occupational Health - Occupational Stress Questionnaire    Feeling of Stress : Not at all  Social Connections: Socially Isolated (06/15/2023)   Social Connection and Isolation Panel [NHANES]    Frequency of Communication with Friends and Family: More than three times a week    Frequency of Social Gatherings with Friends and Family: More than three times a week    Attends Religious Services: Never    Database administrator or Organizations: No    Attends Banker Meetings: Never    Marital Status: Widowed  Intimate Partner Violence: Not At Risk (06/15/2023)   Humiliation, Afraid, Rape, and Kick questionnaire    Fear of Current or Ex-Partner: No    Emotionally Abused: No    Physically Abused: No    Sexually Abused: No    Family History: Family History  Problem Relation Age of Onset   Diabetes Father    Heart disease Father 19       MI   Hypertension Father    Anemia Mother        Transfusion dependent   COPD Sister    Cancer Paternal Grandmother 32       Pancreatic    Current Medications:  Current Outpatient Medications:    acetaminophen  (TYLENOL ) 325 MG tablet, Take 2 tablets (650 mg total) by mouth every 6 (six) hours as needed for mild pain (pain score 1-3) (or Fever >/= 101)., Disp: , Rfl:    albuterol  (PROVENTIL ) (2.5 MG/3ML) 0.083% nebulizer solution, Take 3 mLs (2.5 mg total) by nebulization every 4 (four) hours as needed for wheezing or shortness of breath., Disp: 75 mL, Rfl: 1   albuterol  (VENTOLIN  HFA) 108 (90 Base) MCG/ACT inhaler, Inhale 2 puffs into the lungs every 6 (six) hours as needed for wheezing or shortness of breath., Disp: 8 g, Rfl: 3   bumetanide  (BUMEX ) 1 MG tablet, Take 2 tablets (2 mg total) by mouth daily., Disp: 60 tablet, Rfl: 2   desvenlafaxine  (PRISTIQ ) 100 MG 24 hr tablet, Take 100 mg by mouth in the morning.,  Disp: , Rfl:    esomeprazole  (NEXIUM ) 40 MG capsule, Take  1 capsule (40 mg total) by mouth daily., Disp: 30 capsule, Rfl: 2   ferrous sulfate  325 (65 FE) MG tablet, Take 1 tablet (325 mg total) by mouth daily with breakfast., Disp: 30 tablet, Rfl: 3   fluticasone -salmeterol (ADVAIR) 100-50 MCG/ACT AEPB, Inhale 1 puff into the lungs 2 (two) times daily., Disp: 60 each, Rfl: 5   furosemide  (LASIX ) 40 MG tablet, Take 1 tablet (40 mg total) by mouth daily., Disp: 30 tablet, Rfl: 0   lenalidomide  (REVLIMID ) 15 MG capsule, TAKE 1 CAPSULE BY MOUTH EVERY DAY FOR 21 DAYS ON THEN 7 DAYS OFF, Disp: 21 capsule, Rfl: 0   lidocaine -prilocaine  (EMLA ) cream, Apply 1 Application topically as needed. Apply a small amount to port a cath site and cover with plastic wrap 1 hour prior to infusion appointments, Disp: 30 g, Rfl: 3   lovastatin  (MEVACOR ) 20 MG tablet, Take 1 tablet (20 mg total) by mouth at bedtime., Disp: 90 tablet, Rfl: 3   methocarbamol  (ROBAXIN ) 750 MG tablet, Take 1 tablet (750 mg total) by mouth 3 (three) times daily., Disp: 90 tablet, Rfl: 1   metolazone  (ZAROXOLYN ) 2.5 MG tablet, Take 1 tablet (2.5 mg total) by mouth daily as needed., Disp: 30 tablet, Rfl: 0   nystatin  (MYCOSTATIN /NYSTOP ) powder, Apply topically 2 (two) times daily. (Patient taking differently: Apply 1 Application topically 2 (two) times daily as needed (Yeast).), Disp: 15 g, Rfl: 1   oxyCODONE  (OXY IR/ROXICODONE ) 5 MG immediate release tablet, Take 1 tablet (5 mg total) by mouth every 8 (eight) hours as needed for severe pain (pain score 7-10)., Disp: 10 tablet, Rfl: 0   oxyCODONE -acetaminophen  (PERCOCET) 10-325 MG tablet, Take 0.5-1 tablets by mouth 4 (four) times daily as needed., Disp: , Rfl:    OXYGEN , Inhale 3 L into the lungs continuous., Disp: , Rfl:    OZEMPIC , 0.25 OR 0.5 MG/DOSE, 2 MG/3ML SOPN, Inject 0.25 mg into the skin every 7 (seven) days., Disp: 9 mL, Rfl: 3   carvedilol  (COREG ) 6.25 MG tablet, Take 1 tablet (6.25 mg total) by mouth 2 (two) times daily with a meal., Disp: 60  tablet, Rfl: 5   potassium chloride  SA (KLOR-CON  M) 20 MEQ tablet, Take 1 tablet (20 mEq total) by mouth 2 (two) times daily., Disp: 60 tablet, Rfl: 3   Allergies: Allergies  Allergen Reactions   Keflex  [Cephalexin ] Nausea And Vomiting    REVIEW OF SYSTEMS:   Review of Systems  Constitutional:  Negative for chills, fatigue and fever.  HENT:   Negative for lump/mass, mouth sores, nosebleeds, sore throat and trouble swallowing.   Eyes:  Negative for eye problems.  Respiratory:  Positive for shortness of breath. Negative for cough.   Cardiovascular:  Positive for leg swelling. Negative for chest pain and palpitations.  Gastrointestinal:  Positive for constipation. Negative for abdominal pain, diarrhea, nausea and vomiting.  Genitourinary:  Negative for bladder incontinence, difficulty urinating, dysuria, frequency, hematuria and nocturia.   Musculoskeletal:  Negative for arthralgias, back pain, flank pain, myalgias and neck pain.       +right shoulder pain, 3/10 severity  Skin:  Negative for itching and rash.  Neurological:  Positive for numbness (in feet). Negative for dizziness and headaches.  Hematological:  Does not bruise/bleed easily.  Psychiatric/Behavioral:  Negative for depression, sleep disturbance and suicidal ideas. The patient is not nervous/anxious.   All other systems reviewed and are negative.    VITALS:  Blood pressure 105/83, pulse 91, temperature 98.6 F (37 C), temperature source Oral, resp. rate 18, weight 298 lb 1 oz (135.2 kg), SpO2 95%.  Wt Readings from Last 3 Encounters:  08/29/23 298 lb 1 oz (135.2 kg)  08/01/23 299 lb 3.2 oz (135.7 kg)  07/04/23 275 lb 5.7 oz (124.9 kg)    Body mass index is 54.52 kg/m.  Performance status (ECOG): 1 - Symptomatic but completely ambulatory  PHYSICAL EXAM:   Physical Exam Vitals and nursing note reviewed. Exam conducted with a chaperone present.  Constitutional:      Appearance: Normal appearance.   Cardiovascular:     Rate and Rhythm: Normal rate and regular rhythm.     Pulses: Normal pulses.     Heart sounds: Normal heart sounds.  Pulmonary:     Effort: Pulmonary effort is normal.     Breath sounds: Normal breath sounds.  Abdominal:     Palpations: Abdomen is soft. There is no hepatomegaly, splenomegaly or mass.     Tenderness: There is no abdominal tenderness.  Musculoskeletal:     Right lower leg: No edema.     Left lower leg: No edema.  Lymphadenopathy:     Cervical: No cervical adenopathy.     Right cervical: No superficial, deep or posterior cervical adenopathy.    Left cervical: No superficial, deep or posterior cervical adenopathy.     Upper Body:     Right upper body: No supraclavicular or axillary adenopathy.     Left upper body: No supraclavicular or axillary adenopathy.  Neurological:     General: No focal deficit present.     Mental Status: She is alert and oriented to person, place, and time.  Psychiatric:        Mood and Affect: Mood normal.        Behavior: Behavior normal.     LABS:   CBC     Component Value Date/Time   WBC 2.7 (L) 08/29/2023 0802   RBC 2.75 (L) 08/29/2023 0802   HGB 8.4 (L) 08/29/2023 0802   HGB 12.1 05/15/2023 0928   HCT 28.9 (L) 08/29/2023 0802   HCT 36.7 05/15/2023 0928   PLT 83 (L) 08/29/2023 0802   PLT 109 (L) 05/15/2023 0928   MCV 105.1 (H) 08/29/2023 0802   MCV 101 (H) 05/15/2023 0928   MCH 30.5 08/29/2023 0802   MCHC 29.1 (L) 08/29/2023 0802   RDW 15.5 08/29/2023 0802   RDW 13.1 05/15/2023 0928   LYMPHSABS 1.0 08/29/2023 0802   LYMPHSABS 1.8 06/24/2020 1529   MONOABS 0.3 08/29/2023 0802   EOSABS 0.0 08/29/2023 0802   EOSABS 0.1 06/24/2020 1529   BASOSABS 0.0 08/29/2023 0802   BASOSABS 0.0 06/24/2020 1529    CMP      Component Value Date/Time   NA 136 08/29/2023 0802   NA 146 (H) 05/15/2023 0928   K 3.5 08/29/2023 0802   CL 102 08/29/2023 0802   CO2 26 08/29/2023 0802   GLUCOSE 201 (H) 08/29/2023 0802    BUN 12 08/29/2023 0802   BUN 11 05/15/2023 0928   CREATININE 0.59 08/29/2023 0802   CREATININE 0.71 07/29/2015 1110   CALCIUM  8.1 (L) 08/29/2023 0802   CALCIUM  8.5 (L) 02/09/2023 0859   PROT 5.5 (L) 08/29/2023 0802   PROT 7.1 10/29/2019 1644   ALBUMIN 3.1 (L) 08/29/2023 0802   ALBUMIN 3.2 (L) 10/29/2019 1644   AST 20 08/29/2023 0802   ALT 12 08/29/2023 0802   ALKPHOS 97 08/29/2023  0802   BILITOT 0.5 08/29/2023 0802   BILITOT 0.2 10/29/2019 1644   GFRNONAA >60 08/29/2023 0802   GFRNONAA 102 10/19/2022 0920   GFRNONAA >89 07/29/2015 1110   GFRAA 96 02/07/2020 1415   GFRAA >89 07/29/2015 1110     No results found for: "CEA1", "CEA" / No results found for: "CEA1", "CEA" No results found for: "PSA1" No results found for: "CAN199" No results found for: "CAN125"  Lab Results  Component Value Date   TOTALPROTELP 5.1 (L) 07/26/2023   ALBUMINELP 2.7 (L) 07/26/2023   A1GS 0.3 07/26/2023   A2GS 0.7 07/26/2023   BETS 0.7 07/26/2023   GAMS 0.6 07/26/2023   MSPIKE 0.3 (H) 07/26/2023   SPEI Comment 07/26/2023   Lab Results  Component Value Date   TIBC 294 04/13/2023   TIBC 285 02/09/2023   TIBC 323 01/12/2023   FERRITIN 858 (H) 04/13/2023   FERRITIN 578 (H) 02/09/2023   FERRITIN 441 (H) 01/12/2023   IRONPCTSAT 32 (H) 04/13/2023   IRONPCTSAT 136 (H) 02/09/2023   IRONPCTSAT 28 01/12/2023   Lab Results  Component Value Date   LDH 92 (L) 10/22/2021   LDH 101 06/25/2021   LDH 103 03/24/2021     STUDIES:   No results found.

## 2023-08-29 NOTE — Patient Instructions (Signed)

## 2023-08-29 NOTE — Progress Notes (Signed)
 Patient presents today for Venofer , Daratumumab , and B12 per providers order.  Vital signs and labs reviewed by MD.  Message received from Donzell Gallery RN/Dr. Katragadda, patient okay for treatment.  Daratumumab /B12 administration without incident; injection site WNL; see MAR for injection details.  Patient tolerated procedure well and without incident.   Stable during infusion without adverse affects.  Vital signs stable.  No complaints at this time.  Discharge from clinic ambulatory in stable condition.  Alert and oriented X 3.  Follow up with [facility] as scheduled.

## 2023-08-29 NOTE — Progress Notes (Signed)
 Decrease dose of diphenhydramine  premedication to 25 mg po x 1.  V.O. Dr Davina Ester, PharmD

## 2023-08-29 NOTE — Patient Instructions (Signed)
 [facility] [place] - A DEPT OF [place]  Discharge Instructions: Thank you for choosing [facility] to provide your oncology and hematology care.  If you have a lab appointment with the Cancer Center - please note that after April 8th, 2024, all labs will be drawn in the cancer center.  You do not have to check in or register with the main entrance as you have in the past but will complete your check-in in the cancer center.  Wear comfortable clothing and clothing appropriate for easy access to any Portacath or PICC line.   We strive to give you quality time with your provider. You may need to reschedule your appointment if you arrive late (15 or more minutes).  Arriving late affects you and other patients whose appointments are after yours.  Also, if you miss three or more appointments without notifying the office, you may be dismissed from the clinic at the provider's discretion.      For prescription refill requests, have your pharmacy contact our office and allow 72 hours for refills to be completed.    Today you received the following chemotherapy and/or immunotherapy agents Venofer  Daratumumab  B12      To help prevent nausea and vomiting after your treatment, we encourage you to take your nausea medication as directed.  BELOW ARE SYMPTOMS THAT SHOULD BE REPORTED IMMEDIATELY: *FEVER GREATER THAN 100.4 F (38 C) OR HIGHER *CHILLS OR SWEATING *NAUSEA AND VOMITING THAT IS NOT CONTROLLED WITH YOUR NAUSEA MEDICATION *UNUSUAL SHORTNESS OF BREATH *UNUSUAL BRUISING OR BLEEDING *URINARY PROBLEMS (pain or burning when urinating, or frequent urination) *BOWEL PROBLEMS (unusual diarrhea, constipation, pain near the anus) TENDERNESS IN MOUTH AND THROAT WITH OR WITHOUT PRESENCE OF ULCERS (sore throat, sores in mouth, or a toothache) UNUSUAL RASH, SWELLING OR PAIN  UNUSUAL VAGINAL DISCHARGE OR ITCHING   Items with * indicate a potential emergency and should  be followed up as soon as possible or go to the Emergency Department if any problems should occur.  Please show the CHEMOTHERAPY ALERT CARD or IMMUNOTHERAPY ALERT CARD at check-in to the Emergency Department and triage nurse.  Should you have questions after your visit or need to cancel or reschedule your appointment, please contact [facility] [place] - A DEPT OF [facility] [phone]  and follow the prompts.  Office hours are 8:00 a.m. to 4:30 p.m. Monday - Friday. Please note that voicemails left after 4:00 p.m. may not be returned until the following business day.  We are closed weekends and major holidays. You have access to a nurse at all times for urgent questions. Please call the main number to the clinic [phone] and follow the prompts.  For any non-urgent questions, you may also contact your provider using MyChart. We now offer e-Visits for anyone 13 and older to request care online for non-urgent symptoms. For details visit [email or web address].   Also download the MyChart app! Go to the app store, search "MyChart", open the app, select [place], and log in with your MyChart username and password.

## 2023-08-29 NOTE — Progress Notes (Signed)
 Patient is taking Revlimid  as prescribed. She has not missed any doses and reports no side effects at this time.    Patient has been examined by Dr. Cheree Cords. Vital signs and labs have been reviewed by MD - ANC (1.4), Creatinine, LFTs, hemoglobin, and platelets (83) are within treatment parameters per M.D. - pt may proceed with treatment.  Primary RN and pharmacy notified.

## 2023-09-12 ENCOUNTER — Inpatient Hospital Stay

## 2023-09-13 ENCOUNTER — Inpatient Hospital Stay

## 2023-09-13 VITALS — BP 130/64 | HR 88 | Resp 20

## 2023-09-13 VITALS — BP 131/59 | HR 83 | Temp 97.8°F | Resp 18

## 2023-09-13 DIAGNOSIS — J449 Chronic obstructive pulmonary disease, unspecified: Secondary | ICD-10-CM | POA: Diagnosis not present

## 2023-09-13 DIAGNOSIS — E119 Type 2 diabetes mellitus without complications: Secondary | ICD-10-CM | POA: Diagnosis not present

## 2023-09-13 DIAGNOSIS — D500 Iron deficiency anemia secondary to blood loss (chronic): Secondary | ICD-10-CM

## 2023-09-13 DIAGNOSIS — E785 Hyperlipidemia, unspecified: Secondary | ICD-10-CM | POA: Diagnosis not present

## 2023-09-13 DIAGNOSIS — C9000 Multiple myeloma not having achieved remission: Secondary | ICD-10-CM | POA: Diagnosis not present

## 2023-09-13 DIAGNOSIS — E876 Hypokalemia: Secondary | ICD-10-CM | POA: Diagnosis not present

## 2023-09-13 DIAGNOSIS — I10 Essential (primary) hypertension: Secondary | ICD-10-CM | POA: Diagnosis not present

## 2023-09-13 DIAGNOSIS — R609 Edema, unspecified: Secondary | ICD-10-CM | POA: Diagnosis not present

## 2023-09-13 DIAGNOSIS — Z5111 Encounter for antineoplastic chemotherapy: Secondary | ICD-10-CM | POA: Diagnosis not present

## 2023-09-13 LAB — CBC WITH DIFFERENTIAL/PLATELET
Abs Immature Granulocytes: 0.02 10*3/uL (ref 0.00–0.07)
Basophils Absolute: 0 10*3/uL (ref 0.0–0.1)
Basophils Relative: 0 %
Eosinophils Absolute: 0 10*3/uL (ref 0.0–0.5)
Eosinophils Relative: 1 %
HCT: 24.3 % — ABNORMAL LOW (ref 36.0–46.0)
Hemoglobin: 7.3 g/dL — ABNORMAL LOW (ref 12.0–15.0)
Immature Granulocytes: 1 %
Lymphocytes Relative: 31 %
Lymphs Abs: 1.1 10*3/uL (ref 0.7–4.0)
MCH: 31.9 pg (ref 26.0–34.0)
MCHC: 30 g/dL (ref 30.0–36.0)
MCV: 106.1 fL — ABNORMAL HIGH (ref 80.0–100.0)
Monocytes Absolute: 0.4 10*3/uL (ref 0.1–1.0)
Monocytes Relative: 11 %
Neutro Abs: 2 10*3/uL (ref 1.7–7.7)
Neutrophils Relative %: 56 %
Platelets: 100 10*3/uL — ABNORMAL LOW (ref 150–400)
RBC: 2.29 MIL/uL — ABNORMAL LOW (ref 3.87–5.11)
RDW: 16.7 % — ABNORMAL HIGH (ref 11.5–15.5)
WBC: 3.5 10*3/uL — ABNORMAL LOW (ref 4.0–10.5)
nRBC: 0 % (ref 0.0–0.2)

## 2023-09-13 LAB — PREPARE RBC (CROSSMATCH)

## 2023-09-13 LAB — SAMPLE TO BLOOD BANK

## 2023-09-13 MED ORDER — CETIRIZINE HCL 10 MG PO TABS
10.0000 mg | ORAL_TABLET | Freq: Once | ORAL | Status: AC
  Administered 2023-09-13: 10 mg via ORAL
  Filled 2023-09-13: qty 1

## 2023-09-13 MED ORDER — SODIUM CHLORIDE 0.9% FLUSH
10.0000 mL | Freq: Once | INTRAVENOUS | Status: AC
  Administered 2023-09-13: 10 mL via INTRAVENOUS

## 2023-09-13 MED ORDER — HEPARIN SOD (PORK) LOCK FLUSH 100 UNIT/ML IV SOLN
500.0000 [IU] | Freq: Once | INTRAVENOUS | Status: AC
  Administered 2023-09-13: 500 [IU] via INTRAVENOUS

## 2023-09-13 MED ORDER — SODIUM CHLORIDE 0.9 % IV SOLN: Freq: Once | INTRAVENOUS | Status: AC

## 2023-09-13 MED ORDER — SODIUM CHLORIDE 0.9 % IV SOLN
500.0000 mg | Freq: Once | INTRAVENOUS | Status: AC
  Administered 2023-09-13: 500 mg via INTRAVENOUS
  Filled 2023-09-13: qty 20

## 2023-09-13 NOTE — Progress Notes (Signed)
 HGB 7.3. Standing orders followed. Patient instructed to return to [facility] 09-15-2023 @ 09:00 am for 1 unit of PRBC's. Patient instructed to take 650 of Tylenol  and 25 mg of Benadryl  30 minutes prior to arrival. Understanding verbalized to primary RN. Blood bank aware and orders in.

## 2023-09-13 NOTE — Progress Notes (Signed)
 Patient presents today for Venofer  infusion per providers order.  Vital signs WNL.  Patient complains that she has had bleeding with bowel movements, MD notified and CBC and blood bank sample obtained.  Stable during infusion without adverse affects.  Vital signs stable.  No complaints at this time.  Discharge from clinic via wheelchair in stable condition.  Alert and oriented X 3.  Follow up with [facility] as scheduled.

## 2023-09-13 NOTE — Patient Instructions (Signed)
 [facility] [place] - A DEPT OF [place]  Discharge Instructions: Thank you for choosing [facility] to provide your oncology and hematology care.  If you have a lab appointment with the Cancer Center - please note that after April 8th, 2024, all labs will be drawn in the cancer center.  You do not have to check in or register with the main entrance as you have in the past but will complete your check-in in the cancer center.  Wear comfortable clothing and clothing appropriate for easy access to any Portacath or PICC line.   We strive to give you quality time with your provider. You may need to reschedule your appointment if you arrive late (15 or more minutes).  Arriving late affects you and other patients whose appointments are after yours.  Also, if you miss three or more appointments without notifying the office, you may be dismissed from the clinic at the provider's discretion.      For prescription refill requests, have your pharmacy contact our office and allow 72 hours for refills to be completed.    Today you received the following chemotherapy and/or immunotherapy agents venofer      To help prevent nausea and vomiting after your treatment, we encourage you to take your nausea medication as directed.  BELOW ARE SYMPTOMS THAT SHOULD BE REPORTED IMMEDIATELY: *FEVER GREATER THAN 100.4 F (38 C) OR HIGHER *CHILLS OR SWEATING *NAUSEA AND VOMITING THAT IS NOT CONTROLLED WITH YOUR NAUSEA MEDICATION *UNUSUAL SHORTNESS OF BREATH *UNUSUAL BRUISING OR BLEEDING *URINARY PROBLEMS (pain or burning when urinating, or frequent urination) *BOWEL PROBLEMS (unusual diarrhea, constipation, pain near the anus) TENDERNESS IN MOUTH AND THROAT WITH OR WITHOUT PRESENCE OF ULCERS (sore throat, sores in mouth, or a toothache) UNUSUAL RASH, SWELLING OR PAIN  UNUSUAL VAGINAL DISCHARGE OR ITCHING   Items with * indicate a potential emergency and should be followed up  as soon as possible or go to the Emergency Department if any problems should occur.  Please show the CHEMOTHERAPY ALERT CARD or IMMUNOTHERAPY ALERT CARD at check-in to the Emergency Department and triage nurse.  Should you have questions after your visit or need to cancel or reschedule your appointment, please contact [facility] [place] - A DEPT OF [facility] [phone]  and follow the prompts.  Office hours are 8:00 a.m. to 4:30 p.m. Monday - Friday. Please note that voicemails left after 4:00 p.m. may not be returned until the following business day.  We are closed weekends and major holidays. You have access to a nurse at all times for urgent questions. Please call the main number to the clinic [phone] and follow the prompts.  For any non-urgent questions, you may also contact your provider using MyChart. We now offer e-Visits for anyone 42 and older to request care online for non-urgent symptoms. For details visit [email or web address].   Also download the MyChart app! Go to the app store, search "MyChart", open the app, select [place], and log in with your MyChart username and password.

## 2023-09-14 DIAGNOSIS — I10 Essential (primary) hypertension: Secondary | ICD-10-CM | POA: Diagnosis not present

## 2023-09-14 DIAGNOSIS — M5136 Other intervertebral disc degeneration, lumbar region with discogenic back pain only: Secondary | ICD-10-CM | POA: Diagnosis not present

## 2023-09-14 DIAGNOSIS — D696 Thrombocytopenia, unspecified: Secondary | ICD-10-CM | POA: Diagnosis not present

## 2023-09-14 DIAGNOSIS — Z6841 Body Mass Index (BMI) 40.0 and over, adult: Secondary | ICD-10-CM | POA: Diagnosis not present

## 2023-09-14 DIAGNOSIS — K7460 Unspecified cirrhosis of liver: Secondary | ICD-10-CM | POA: Diagnosis not present

## 2023-09-14 DIAGNOSIS — C9000 Multiple myeloma not having achieved remission: Secondary | ICD-10-CM | POA: Diagnosis not present

## 2023-09-14 DIAGNOSIS — E119 Type 2 diabetes mellitus without complications: Secondary | ICD-10-CM | POA: Diagnosis not present

## 2023-09-14 DIAGNOSIS — R918 Other nonspecific abnormal finding of lung field: Secondary | ICD-10-CM | POA: Diagnosis not present

## 2023-09-14 LAB — PROTEIN ELECTROPHORESIS, SERUM
A/G Ratio: 1.6 (ref 0.7–1.7)
Albumin ELP: 3 g/dL (ref 2.9–4.4)
Alpha-1-Globulin: 0.2 g/dL (ref 0.0–0.4)
Alpha-2-Globulin: 0.6 g/dL (ref 0.4–1.0)
Beta Globulin: 0.8 g/dL (ref 0.7–1.3)
Gamma Globulin: 0.4 g/dL (ref 0.4–1.8)
Globulin, Total: 1.9 g/dL — ABNORMAL LOW (ref 2.2–3.9)
M-Spike, %: 0.2 g/dL — ABNORMAL HIGH
Total Protein ELP: 4.9 g/dL — ABNORMAL LOW (ref 6.0–8.5)

## 2023-09-14 LAB — BPAM RBC: Unit Type and Rh: 600

## 2023-09-14 LAB — KAPPA/LAMBDA LIGHT CHAINS
Kappa free light chain: 13 mg/L (ref 3.3–19.4)
Kappa, lambda light chain ratio: 2 — ABNORMAL HIGH (ref 0.26–1.65)
Lambda free light chains: 6.5 mg/L (ref 5.7–26.3)

## 2023-09-15 ENCOUNTER — Inpatient Hospital Stay

## 2023-09-15 DIAGNOSIS — E785 Hyperlipidemia, unspecified: Secondary | ICD-10-CM | POA: Diagnosis not present

## 2023-09-15 DIAGNOSIS — R609 Edema, unspecified: Secondary | ICD-10-CM | POA: Diagnosis not present

## 2023-09-15 DIAGNOSIS — E876 Hypokalemia: Secondary | ICD-10-CM | POA: Diagnosis not present

## 2023-09-15 DIAGNOSIS — I10 Essential (primary) hypertension: Secondary | ICD-10-CM | POA: Diagnosis not present

## 2023-09-15 DIAGNOSIS — D500 Iron deficiency anemia secondary to blood loss (chronic): Secondary | ICD-10-CM

## 2023-09-15 DIAGNOSIS — C9000 Multiple myeloma not having achieved remission: Secondary | ICD-10-CM | POA: Diagnosis not present

## 2023-09-15 DIAGNOSIS — E119 Type 2 diabetes mellitus without complications: Secondary | ICD-10-CM | POA: Diagnosis not present

## 2023-09-15 DIAGNOSIS — J449 Chronic obstructive pulmonary disease, unspecified: Secondary | ICD-10-CM | POA: Diagnosis not present

## 2023-09-15 DIAGNOSIS — Z5111 Encounter for antineoplastic chemotherapy: Secondary | ICD-10-CM | POA: Diagnosis not present

## 2023-09-15 LAB — TYPE AND SCREEN

## 2023-09-15 MED ORDER — SODIUM CHLORIDE 0.9% IV SOLUTION
250.0000 mL | INTRAVENOUS | Status: DC
  Administered 2023-09-15: 100 mL via INTRAVENOUS

## 2023-09-15 MED ORDER — DIPHENHYDRAMINE HCL 25 MG PO CAPS
25.0000 mg | ORAL_CAPSULE | Freq: Four times a day (QID) | ORAL | Status: DC | PRN
  Administered 2023-09-15: 25 mg via ORAL
  Filled 2023-09-15: qty 1

## 2023-09-15 MED ORDER — HEPARIN SOD (PORK) LOCK FLUSH 100 UNIT/ML IV SOLN
500.0000 [IU] | Freq: Every day | INTRAVENOUS | Status: AC | PRN
  Administered 2023-09-15: 500 [IU]

## 2023-09-15 MED ORDER — ACETAMINOPHEN 325 MG PO TABS
650.0000 mg | ORAL_TABLET | Freq: Once | ORAL | Status: AC
  Administered 2023-09-15: 650 mg via ORAL
  Filled 2023-09-15: qty 2

## 2023-09-15 MED ORDER — SODIUM CHLORIDE 0.9% FLUSH
10.0000 mL | INTRAVENOUS | Status: AC | PRN
  Administered 2023-09-15: 10 mL

## 2023-09-15 NOTE — Patient Instructions (Signed)

## 2023-09-15 NOTE — Progress Notes (Signed)
 Patient tolerated transfusion with no complaints voiced.  Side effects with management reviewed with understanding verbalized.  Port site clean and dry with no bruising or swelling noted at site.  Good blood return noted before and after administration.  Band aid applied.  Patient left in satisfactory condition with VSS and no s/s of distress noted.

## 2023-09-16 LAB — TYPE AND SCREEN
ABO/RH(D): A POS
Antibody Screen: POSITIVE
Donor AG Type: NEGATIVE
Unit division: 0

## 2023-09-16 LAB — BPAM RBC
Blood Product Expiration Date: 202507152359
Unit Type and Rh: 202507052359

## 2023-09-20 ENCOUNTER — Other Ambulatory Visit: Payer: Self-pay

## 2023-09-20 ENCOUNTER — Other Ambulatory Visit: Payer: Self-pay | Admitting: *Deleted

## 2023-09-20 DIAGNOSIS — Z9181 History of falling: Secondary | ICD-10-CM

## 2023-09-20 NOTE — Patient Instructions (Signed)
 Visit Information  Thank you for taking time to visit with me today. Please don't hesitate to contact me if I can be of assistance to you before our next scheduled appointment.  Our next appointment is by telephone on 10-03-2023 at 1:00 pm Please call the care guide team at [phone] if you need to cancel or reschedule your appointment.   Following is a copy of your care plan:   Goals Addressed             This Visit's Progress    VBCI RN Care Plan: CHF       Problems:  Chronic Disease Management support and education needs related to CHF  Goal: Over the next 90 days the Patient will attend all scheduled medical appointments: with primary care provider and specialist as evidenced by keeping all scheduled appointments        continue to work with RN Care Manager and/or Social Worker to address care management and care coordination needs related to CHF as evidenced by adherence to care management team scheduled appointments     take all medications exactly as prescribed and will call provider for medication related questions as evidenced by compliance with all medications    verbalize basic understanding of CHF disease process and self health management plan as evidenced by verbal explanation, recognizing/monitoring symptoms, lifestyle changes  Interventions:   Heart Failure Interventions: Basic overview and discussion of pathophysiology of Heart Failure reviewed Provided education on low sodium diet Reviewed Heart Failure Action Plan in depth and provided written copy Assessed need for readable accurate scales in home Provided education about placing scale on hard, flat surface Advised patient to weigh each morning after emptying bladder Discussed importance of daily weight and advised patient to weigh and record daily Reviewed role of diuretics in prevention of fluid overload and management of heart failure; Discussed the importance of keeping all appointments with  provider Provided patient with education about the role of exercise in the management of heart failure Advised patient to discuss acute changes with provider Screening for signs and symptoms of depression related to chronic disease state  Assessed social determinant of health barriers  Wt Readings from Last 3 Encounters:  08/29/23 298 lb 1 oz (135.2 kg)  08/01/23 299 lb 3.2 oz (135.7 kg)  07/04/23 275 lb 5.7 oz (124.9 kg)     Patient Self-Care Activities:  Attend all scheduled provider appointments Attend church or other social activities Call pharmacy for medication refills 3-7 days in advance of running out of medications Call provider office for new concerns or questions  Perform all self care activities independently  Perform IADL's (shopping, preparing meals, housekeeping, managing finances) independently Take medications as prescribed   call office if I gain more than 2 pounds in one day or 5 pounds in one week use salt in moderation watch for swelling in feet, ankles and legs every day weigh myself daily follow rescue plan if symptoms flare-up track symptoms and what helps feel better or worse dress right for the weather, hot or cold  Plan:  Telephone follow up appointment with care management team member scheduled for:  10-03-2023 at 1:00 pm          VBCI RN Care Plan: COPD       Problems:  Chronic Disease Management support and education needs related to COPD  Goal: Over the next 90 days the Patient will attend all scheduled medical appointments: with primary care provider and specialist as evidenced by keeping all  scheduled appointments        continue to work with RN Care Manager and/or Social Worker to address care management and care coordination needs related to COPD as evidenced by adherence to care management team scheduled appointments     take all medications exactly as prescribed and will call provider for medication related questions as evidenced by  compliance with all medications    verbalize basic understanding of COPD disease process and self health management plan as evidenced by verbal explanation, recognizing/monitoring symptoms, lifestyle changes  Interventions:   COPD Interventions: Advised patient to track and manage COPD triggers Advised patient to self assesses COPD action plan zone and make appointment with provider if in the yellow zone for 48 hours without improvement Assessed social determinant of health barriers Discussed the importance of adequate rest and management of fatigue with COPD Provided education about and advised patient to utilize infection prevention strategies to reduce risk of respiratory infection Provided instruction about proper use of medications used for management of COPD including inhalers Provided patient with basic written and verbal COPD education on self care/management/and exacerbation prevention Provided written and verbal instructions on pursed lip breathing and utilized returned demonstration as teach back Screening for signs and symptoms of depression related to chronic disease state  Use of home oxygen   Patient Self-Care Activities:  Attend all scheduled provider appointments Attend church or other social activities Call pharmacy for medication refills 3-7 days in advance of running out of medications Call provider office for new concerns or questions  Perform all self care activities independently  Perform IADL's (shopping, preparing meals, housekeeping, managing finances) independently Take medications as prescribed   identify and remove indoor air pollutants limit outdoor activity during cold weather develop a rescue plan follow rescue plan if symptoms flare-up  Plan:  Telephone follow up appointment with care management team member scheduled for:  10-03-2023 at 1:00 pm          VBCI RN Care Plan: DM       Problems:  Chronic Disease Management support and education needs  related to DMII  Goal: Over the next 90 days the Patient will attend all scheduled medical appointments: with primary care provider and specialist as evidenced by keeping all scheduled appointments        continue to work with RN Care Manager and/or Social Worker to address care management and care coordination needs related to DMII as evidenced by adherence to care management team scheduled appointments     take all medications exactly as prescribed and will call provider for medication related questions as evidenced by compliance with all medications    verbalize basic understanding of DMII disease process and self health management plan as evidenced by verbal explanation, recognizing/monitoring symptoms, lifestyle modifications  Interventions:   Diabetes Interventions: Assessed patient's understanding of A1c goal: <6.5% Provided education to patient about basic DM disease process Reviewed medications with patient and discussed importance of medication adherence Counseled on importance of regular laboratory monitoring as prescribed Discussed plans with patient for ongoing care management follow up and provided patient with direct contact information for care management team Provided patient with written educational materials related to hypo and hyperglycemia and importance of correct treatment Reviewed scheduled/upcoming provider appointments including: no PCP follow ups at this time Advised patient, providing education and rationale, to check cbg daily fasting and record, calling provider for findings outside established parameters Review of patient status, including review of consultants reports, relevant laboratory and other test results, and  medications completed Screening for signs and symptoms of depression related to chronic disease state  Assessed social determinant of health barriers Lab Results  Component Value Date   HGBA1C 6.7 (H) 06/15/2023    Patient Self-Care Activities:   Attend all scheduled provider appointments Attend church or other social activities Call pharmacy for medication refills 3-7 days in advance of running out of medications Call provider office for new concerns or questions  Perform all self care activities independently  Perform IADL's (shopping, preparing meals, housekeeping, managing finances) independently Take medications as prescribed   schedule appointment with eye doctor check blood sugar at prescribed times: before meals and at bedtime and when you have symptoms of low or high blood sugar check feet daily for cuts, sores or redness fill half of plate with vegetables join a weight loss program limit fast food meals to no more than 1 per week manage portion size prepare main meal at home 3 to 5 days each week set a realistic goal switch to low-fat or skim milk switch to sugar-free drinks keep feet up while sitting wear comfortable, cotton socks wear comfortable, well-fitting shoes  Plan:  Telephone follow up appointment with care management team member scheduled for:  10-03-2023 at 1:00 pm             Please call the Suicide and Crisis Lifeline: 988 call the [place]  National Suicide Prevention Lifeline: 548-583-7697 or TTY: 360-208-7976 TTY 401-166-5565) to talk to a trained counselor call 1-800-273-TALK (toll free, 24 hour hotline) call the [facility]: (531)459-2623 call 911 if you are experiencing a Mental Health or Behavioral Health Crisis or need someone to talk to.  Patient verbalizes understanding of instructions and care plan provided today and agrees to view in MyChart. Active MyChart status and patient understanding of how to access instructions and care plan via MyChart confirmed with patient.     Rosina Forte, BSN RN [facility], [facility] RN Care Manager Direct Dial: [phone]  Fax: [phone]  Carbohydrate Counting for Diabetes Mellitus,  Adult Carbohydrate counting is a method of keeping track of how many carbohydrates you eat. Eating carbohydrates increases the amount of sugar (glucose) in the blood. Counting how many carbohydrates you eat improves how well you manage your blood glucose. This, in turn, helps you manage your diabetes. Carbohydrates are measured in grams (g) per serving. It is important to know how many carbohydrates (in grams or by serving size) you can have in each meal. This is different for every person. A dietitian can help you make a meal plan and calculate how many carbohydrates you should have at each meal and snack. What foods contain carbohydrates? Carbohydrates are found in the following foods: Grains, such as breads and cereals. Dried beans and soy products. Starchy vegetables, such as potatoes, peas, and corn. Fruit and fruit juices. Milk and yogurt. Sweets and snack foods, such as cake, cookies, candy, chips, and soft drinks. How do I count carbohydrates in foods? There are two ways to count carbohydrates in food. You can read food labels or learn standard serving sizes of foods. You can use either of these methods or a combination of both. Using the Nutrition Facts label The Nutrition Facts list is included on the labels of almost all packaged foods and beverages in the [place] . It includes: The serving size. Information about nutrients in each serving, including the grams of carbohydrate per serving. To use the Nutrition Facts, decide how many servings  you will have. Then, multiply the number of servings by the number of carbohydrates per serving. The resulting number is the total grams of carbohydrates that you will be having. Learning the standard serving sizes of foods When you eat carbohydrate foods that are not packaged or do not include Nutrition Facts on the label, you need to measure the servings in order to count the grams of carbohydrates. Measure the foods that you will eat with a  food scale or measuring cup, if needed. Decide how many standard-size servings you will eat. Multiply the number of servings by 15. For foods that contain carbohydrates, one serving equals 15 g of carbohydrates. For example, if you eat 2 cups or 10 oz (300 g) of strawberries, you will have eaten 2 servings and 30 g of carbohydrates (2 servings x 15 g = 30 g). For foods that have more than one food mixed, such as soups and casseroles, you must count the carbohydrates in each food that is included. The following list contains standard serving sizes of common carbohydrate-rich foods. Each of these servings has about 15 g of carbohydrates: 1 slice of bread. 1 six-inch (15 cm) tortilla. ? cup or 2 oz (53 g) cooked rice or pasta.  cup or 3 oz (85 g) cooked or canned, drained and rinsed beans or lentils.  cup or 3 oz (85 g) starchy vegetable, such as peas, corn, or squash.  cup or 4 oz (120 g) hot cereal.  cup or 3 oz (85 g) boiled or mashed potatoes, or  or 3 oz (85 g) of a large baked potato.  cup or 4 fl oz (118 mL) fruit juice. 1 cup or 8 fl oz (237 mL) milk. 1 small or 4 oz (106 g) apple.  or 2 oz (63 g) of a medium banana. 1 cup or 5 oz (150 g) strawberries. 3 cups or 1 oz (28.3 g) popped popcorn. What is an example of carbohydrate counting? To calculate the grams of carbohydrates in this sample meal, follow the steps shown below. Sample meal 3 oz (85 g) chicken breast. ? cup or 4 oz (106 g) brown rice.  cup or 3 oz (85 g) corn. 1 cup or 8 fl oz (237 mL) milk. 1 cup or 5 oz (150 g) strawberries with sugar-free whipped topping. Carbohydrate calculation Identify the foods that contain carbohydrates: Rice. Corn. Milk. Strawberries. Calculate how many servings you have of each food: 2 servings rice. 1 serving corn. 1 serving milk. 1 serving strawberries. Multiply each number of servings by 15 g: 2 servings rice x 15 g = 30 g. 1 serving corn x 15 g = 15 g. 1 serving milk x  15 g = 15 g. 1 serving strawberries x 15 g = 15 g. Add together all of the amounts to find the total grams of carbohydrates eaten: 30 g + 15 g + 15 g + 15 g = 75 g of carbohydrates total. What are tips for following this plan? Shopping Develop a meal plan and then make a shopping list. Buy fresh and frozen vegetables, fresh and frozen fruit, dairy, eggs, beans, lentils, and whole grains. Look at food labels. Choose foods that have more fiber and less sugar. Avoid processed foods and foods with added sugars. Meal planning Aim to have the same number of grams of carbohydrates at each meal and for each snack time. Plan to have regular, balanced meals and snacks. Where to find more information American Diabetes Association: diabetes.org Centers for  Disease Control and Prevention: TonerPromos.no Academy of Nutrition and Dietetics: eatright.org Association of Diabetes Care & Education Specialists: diabeteseducator.org Summary Carbohydrate counting is a method of keeping track of how many carbohydrates you eat. Eating carbohydrates increases the amount of sugar (glucose) in your blood. Counting how many carbohydrates you eat improves how well you manage your blood glucose. This helps you manage your diabetes. A dietitian can help you make a meal plan and calculate how many carbohydrates you should have at each meal and snack. This information is not intended to replace advice given to you by your health care provider. Make sure you discuss any questions you have with your health care provider. Document Revised: 10/15/2019 Document Reviewed: 10/16/2019 Elsevier Patient Education  2024 Elsevier Inc.  COPD Action Plan A COPD action plan is a description of what to do when you have a flare (exacerbation) of chronic obstructive pulmonary disease (COPD). Your action plan is a color-coded plan that lists the symptoms that indicate whether your condition is under control and what actions to take. If you have  symptoms in the green zone, it means you are doing well that day. If you have symptoms in the yellow zone, it means you are having a bad day or an exacerbation. If you have symptoms in the red zone, you need urgent medical care. Follow the plan that you and your health care provider developed. Review your plan with your health care provider at each visit. Red zone Symptoms in this zone mean that you should get medical help right away. They include: Feeling very short of breath, even when you are resting. Not being able to do any activities because of poor breathing. Not being able to sleep because of poor breathing. Fever or shaking chills. Feeling confused or very sleepy. Chest pain. Coughing up blood. If you have any of these symptoms, call emergency services (911 in the [place] or go to the nearest emergency room. Yellow zone Symptoms in this zone mean that your condition may be getting worse. They include: Feeling more short of breath than usual. Having less energy for daily activities than usual. Phlegm or mucus that is thicker than usual. Needing to use your rescue inhaler or nebulizer more often than usual. More ankle swelling than usual. Coughing more than usual. Feeling like you have a chest cold. Trouble sleeping due to COPD symptoms. Decreased appetite. COPD medicines not helping as much as usual. If you experience any yellow symptoms: Keep taking your daily medicines as directed. Use your quick-relief inhaler as told by your health care provider. If you were prescribed steroid medicine to take by mouth (oral medicine), start taking it as told by your health care provider. If you were prescribed an antibiotic medicine, start taking it as told by your health care provider. Do not stop taking the antibiotic even if you start to feel better. Use oxygen  as told by your health care provider. Get more rest. Do your pursed-lip breathing exercises. Do not smoke. Avoid any  irritants in the air. If your signs and symptoms do not improve after taking these steps, call your health care provider right away. Green zone Symptoms in this zone mean that you are doing well. They include: Being able to do your usual activities and exercise. Having the usual amount of coughing, including the same amount of phlegm or mucus. Being able to sleep well. Having a good appetite. Where to find more information: You can find more information about COPD from: American Lung Association,  My COPD Action Plan: www.lung.org COPD Foundation: www.copdfoundation.org National Heart, Lung, & Blood Institute: PopSteam.is Follow these instructions at home: Continue taking your daily medicines as told by your health care provider. Make sure you receive all the immunizations that your health care provider recommends, especially the pneumococcal and influenza vaccines. Wash your hands often with soap and water . Have family members wash their hands too. Regular hand washing can help prevent infections. Follow your usual exercise and diet plan. Avoid irritants in the air, such as smoke. Do not use any products that contain nicotine  or tobacco. These products include cigarettes, chewing tobacco, and vaping devices, such as e-cigarettes. If you need help quitting, ask your health care provider. Summary A COPD action plan tells you what to do when you have a flare (exacerbation) of chronic obstructive pulmonary disease (COPD). Follow each action plan for your symptoms. If you have any symptoms in the red zone, call emergency services (911 in the [place] or go to the nearest emergency room. This information is not intended to replace advice given to you by your health care provider. Make sure you discuss any questions you have with your health care provider. Document Revised: 01/26/2023 Document Reviewed: 01/26/2023 Elsevier Patient Education  2024 Elsevier Inc.  Heart Failure Action Plan A  heart failure action plan helps you know what to do when you have symptoms of heart failure. Your action plan is a color-coded plan that lists the symptoms to watch for and indicates what actions to take. If you have symptoms in the green zone, you're doing well. If you have symptoms in the yellow zone, you're having problems. If you have symptoms in the red zone, you need medical care right away. Follow the plan that was created by you and your health care provider. Review your plan each time you visit your provider. Green zone These signs mean you're doing well and can continue what you're doing: You don't have new or worsening shortness of breath. You have very little swelling or no new swelling. Your weight is stable (no gain or loss). You have a normal activity level. You don't have chest pain or any other new symptoms. Yellow zone These signs and symptoms mean your condition may be getting worse and you should make some changes: You have trouble breathing when you're active. You have swelling in your feet or legs or have discomfort in your belly. You gain 2-3 lb (0.9-1.4 kg) in 24 hours, or 5 lb (2.3 kg) in a week. This amount may be more or less depending on your condition. You get tired easily. You have trouble sleeping. You have a dry cough. If you have any of these symptoms: Contact your provider within the next day. Your provider may adjust your medicines. Red zone These signs and symptoms mean you should get medical help right away: You have trouble breathing when resting or cannot lie flat and you need to raise your head to help you breathe. You have a dry cough that's getting worse. You have swelling or pain in your feet or legs or discomfort in your belly that's getting worse. You suddenly gain more than 2-3 lb (0.9-1.4 kg) in 24 hours, or more than 5 lb (2.3 kg) in a week. This amount may be more or less depending on your condition. You have trouble staying awake or you  feel confused. You don't have an appetite. You have worsening sadness or depression. These symptoms may be an emergency. Call 911 right away. Do not  wait to see if the symptoms will go away. Do not drive yourself to the hospital. Follow these instructions at home: Take medicines only as told. Eat a heart-healthy diet. Work with a dietitian to create an eating plan that's best for you. Weigh yourself each day. Your target weight is __________ lb (__________ kg). Call your provider if you gain more than __________ lb (__________ kg) in 24 hours, or more than __________ lb (__________ kg) in a week. Health care provider name: _____________________________________________________ Health care provider phone number: _____________________________________________________ Where to find more information American Heart Association: heart.org This information is not intended to replace advice given to you by your health care provider. Make sure you discuss any questions you have with your health care provider. Document Revised: 10/27/2022 Document Reviewed: 10/27/2022 Elsevier Patient Education  2024 [employer].

## 2023-09-20 NOTE — Patient Outreach (Signed)
 Complex Care Management   Visit Note  09/20/2023  Name:  Savannah Burns MRN: [id number] DOB: 21-May-1950  Situation: Referral received for Complex Care Management related to Heart Failure, COPD, and Diabetes with Complications I obtained verbal consent from Patient.  Visit completed with patient  on the phone  Background:   Past Medical History:  Diagnosis Date   Adrenal adenoma, left    Stable   Anxiety    Arthritis    bilateral hands   COPD (chronic obstructive pulmonary disease) (HCC)    Depression    Diabetes mellitus, type 2 (HCC) 08/12/2008   Qualifier: Diagnosis of  By: Jenetta MD, Talia     Dyspnea    Esophageal varices (HCC)    Grade II diastolic dysfunction    History of kidney stones    Hyperlipidemia    Hypertension    Lower back pain    Lower GI bleed 03/19/2020   Panic attacks    Pneumonia    currently taking antibiotic and prednisone  for early stages of pneumonia   Pulmonary nodules    bilateral   Skin cancer    face    Assessment: Patient Reported Symptoms:  Cognitive Cognitive Status: Able to follow simple commands, Alert and oriented to person, place, and time, Insightful and able to interpret abstract concepts, Normal speech and language skills Cognitive/Intellectual Conditions Management [RPT]: None reported or documented in medical history or problem list   Health Maintenance Behaviors: Annual physical exam  Neurological Neurological Review of Symptoms: No symptoms reported    HEENT HEENT Symptoms Reported: No symptoms reported      Cardiovascular Cardiovascular Symptoms Reported: Swelling in legs or feet Does patient have uncontrolled Hypertension?: No Cardiovascular Conditions: Heart failure, Hypertension Cardiovascular Management Strategies: Medication therapy, Routine screening  Respiratory Respiratory Symptoms Reported: No symptoms reported Respiratory Conditions: COPD  Endocrine Patient reports the following symptoms related to  hypoglycemia or hyperglycemia : No symptoms reported Is patient diabetic?: Yes Is patient checking blood sugars at home?: No Endocrine Management Strategies: Routine screening, Medication therapy  Gastrointestinal Gastrointestinal Symptoms Reported: Constipation, Abdominal pain or discomfort Gastrointestinal Conditions: Bleeding Gastrointestinal Comment: black tarry stools Nutrition Risk Screen (CP): No indicators present  Genitourinary Genitourinary Symptoms Reported: No symptoms reported    Integumentary Integumentary Symptoms Reported: No symptoms reported    Musculoskeletal Musculoskelatal Symptoms Reviewed: Unsteady gait Musculoskeletal Conditions: Mobility limited Musculoskeletal Management Strategies: Medication therapy Falls in the past year?: No Number of falls in past year: 1 or less Was there an injury with Fall?: No Fall Risk Category Calculator: 0 Patient Fall Risk Level: Low Fall Risk Patient at Risk for Falls Due to: No Fall Risks Fall risk Follow up: Falls evaluation completed  Psychosocial Psychosocial Symptoms Reported: No symptoms reported     Quality of Family Relationships: helpful, involved, supportive Do you feel physically threatened by others?: No      09/20/2023    2:30 PM  Depression screen PHQ 2/9  Decreased Interest 0  Down, Depressed, Hopeless 0  PHQ - 2 Score 0    There were no vitals filed for this visit.  Medications Reviewed Today     Reviewed by Bertrum Rosina HERO, RN (Registered Nurse) on 09/20/23 at 1416  Med List Status: <None>   Medication Order Taking? Sig Documenting Provider Last Dose Status Informant  acetaminophen  (TYLENOL ) 325 MG tablet [id number] Yes Take 2 tablets (650 mg total) by mouth every 6 (six) hours as needed for mild pain (pain score 1-3) (or Fever >/=  101). Emokpae, Courage, MD  Active   albuterol  (PROVENTIL ) (2.5 MG/3ML) 0.083% nebulizer solution [id number] Yes Take 3 mLs (2.5 mg total) by nebulization every 4 (four)  hours as needed for wheezing or shortness of breath. Pearlean Manus, MD  Active   albuterol  (VENTOLIN  HFA) 108 (90 Base) MCG/ACT inhaler [id number] Yes Inhale 2 puffs into the lungs every 6 (six) hours as needed for wheezing or shortness of breath. Pearlean Manus, MD  Active   bumetanide  (BUMEX ) 1 MG tablet [id number] Yes Take 2 tablets (2 mg total) by mouth daily. Katragadda, Sreedhar, MD  Active   carvedilol  (COREG ) 6.25 MG tablet [id number] Yes Take 1 tablet (6.25 mg total) by mouth 2 (two) times daily with a meal. Emokpae, Courage, MD  Active   desvenlafaxine  (PRISTIQ ) 100 MG 24 hr tablet [id number] Yes Take 100 mg by mouth in the morning. [provider]  Active Family Member           Med Note (WARD, ANGELICA G   Thu Jun 15, 2023  2:42 PM)    esomeprazole  (NEXIUM ) 40 MG capsule [id number] Yes Take 1 capsule (40 mg total) by mouth daily. Willette Adriana LABOR, MD  Active Family Member  ferrous sulfate  325 (65 FE) MG tablet [id number] Yes Take 1 tablet (325 mg total) by mouth daily with breakfast. Willette Adriana LABOR, MD  Active Family Member  fluticasone -salmeterol (ADVAIR) 100-50 MCG/ACT AEPB [id number] Yes Inhale 1 puff into the lungs 2 (two) times daily. Pearlean Manus, MD  Active   furosemide  (LASIX ) 40 MG tablet [id number] Yes Take 1 tablet (40 mg total) by mouth daily. Pearlean Manus, MD  Active   lenalidomide  (REVLIMID ) 15 MG capsule [id number] Yes TAKE 1 CAPSULE BY MOUTH EVERY DAY FOR 21 DAYS ON THEN 7 DAYS OFF Rogers Hai, MD  Active   lidocaine -prilocaine  (EMLA ) cream [id number] Yes Apply 1 Application topically as needed. Apply a small amount to port a cath site and cover with plastic wrap 1 hour prior to infusion appointments Pappayliou, Dorothyann LABOR, DO  Active Family Member  lovastatin  (MEVACOR ) 20 MG tablet [id number] Yes Take 1 tablet (20 mg total) by mouth at bedtime. Willette Adriana LABOR, MD  Active Family Member  methocarbamol  (ROBAXIN ) 750 MG tablet [id number] Yes Take 1  tablet (750 mg total) by mouth 3 (three) times daily. Pearlean Manus, MD  Active   metolazone  (ZAROXOLYN ) 2.5 MG tablet [id number] Yes Take 1 tablet (2.5 mg total) by mouth daily as needed. Katragadda, Sreedhar, MD  Active   nystatin  (MYCOSTATIN /NYSTOP ) powder [id number] Yes Apply topically 2 (two) times daily. Dettinger, Fonda LABOR, MD  Active Family Member           Med Note (WARD, CHUCK KANDICE Schaumann Jun 15, 2023  2:42 PM)    oxyCODONE  (OXY IR/ROXICODONE ) 5 MG immediate release tablet [id number] Yes Take 1 tablet (5 mg total) by mouth every 8 (eight) hours as needed for severe pain (pain score 7-10). Pearlean Manus, MD  Active   oxyCODONE -acetaminophen  (PERCOCET) 10-325 MG tablet [id number]  Take 0.5-1 tablets by mouth 4 (four) times daily as needed.  Patient not taking: Reported on 09/20/2023   [provider]  Consider Medication Status and Discontinue   OXYGEN  [id number] Yes Inhale 3 L into the lungs continuous. [provider]  Active Family Member           Med Note (WARD, ANGELICA G   Thu Jun 15, 2023  2:42 PM)    OZEMPIC , 0.25 OR 0.5  MG/DOSE, 2 MG/3ML SOPN [id number] Yes Inject 0.25 mg into the skin every 7 (seven) days. Jolinda Norene HERO, DO  Active Family Member           Med Note JACKOLYN WADDELL VEAR Austin Jun 11, 2023 11:28 AM)    potassium chloride  SA (KLOR-CON  M) 20 MEQ tablet [id number] Yes Take 1 tablet (20 mEq total) by mouth 2 (two) times daily. Rogers Hai, MD  Active   Med List Note Teretha Renaee SAILOR, RPH-CPP 12/03/21 1141): Revlimid  filled at [facility]            Recommendation:   Start weighing daily, recording results and notifying provider for acute changes Start checking blood sugar daily, fasting and recording results Follow Up Plan:   Telephone follow-up in 2 weeks  Rosina Forte, BSN RN [facility], [facility] RN Care Manager Direct Dial: [phone]  Fax:  [phone]

## 2023-09-21 ENCOUNTER — Other Ambulatory Visit: Payer: Self-pay | Admitting: *Deleted

## 2023-09-21 ENCOUNTER — Other Ambulatory Visit: Payer: Self-pay

## 2023-09-21 DIAGNOSIS — C9000 Multiple myeloma not having achieved remission: Secondary | ICD-10-CM

## 2023-09-21 DIAGNOSIS — E1169 Type 2 diabetes mellitus with other specified complication: Secondary | ICD-10-CM

## 2023-09-21 MED ORDER — LENALIDOMIDE 15 MG PO CAPS: ORAL_CAPSULE | ORAL | 0 refills | Status: DC

## 2023-09-22 ENCOUNTER — Telehealth: Payer: Self-pay | Admitting: *Deleted

## 2023-09-22 NOTE — Progress Notes (Signed)
 Complex Care Management Note Care Guide Note  09/22/2023 Name: Savannah  LATARSHA Burns MRN: [id number] DOB: Jul 07, 1950   Complex Care Management Outreach Attempts: An unsuccessful telephone outreach was attempted today to offer the patient information about available complex care management services.  Follow Up Plan:  Additional outreach attempts will be made to offer the patient complex care management information and services.   Encounter Outcome:  No Answer  [facility] HealthPopulation Health Care Guide  Direct Dial:[phone] [phone] Website: [email or web address]

## 2023-09-24 DIAGNOSIS — J449 Chronic obstructive pulmonary disease, unspecified: Secondary | ICD-10-CM | POA: Diagnosis not present

## 2023-09-26 ENCOUNTER — Inpatient Hospital Stay: Attending: Hematology

## 2023-09-26 ENCOUNTER — Encounter (INDEPENDENT_AMBULATORY_CARE_PROVIDER_SITE_OTHER): Payer: Self-pay

## 2023-09-26 ENCOUNTER — Encounter: Payer: Self-pay | Admitting: Hematology

## 2023-09-26 ENCOUNTER — Inpatient Hospital Stay

## 2023-09-26 ENCOUNTER — Inpatient Hospital Stay (HOSPITAL_BASED_OUTPATIENT_CLINIC_OR_DEPARTMENT_OTHER): Admitting: Hematology

## 2023-09-26 ENCOUNTER — Telehealth: Payer: Self-pay | Admitting: *Deleted

## 2023-09-26 VITALS — HR 114

## 2023-09-26 VITALS — BP 138/72 | HR 87 | Temp 97.7°F | Resp 18 | Wt 296.0 lb

## 2023-09-26 DIAGNOSIS — Z7961 Long term (current) use of immunomodulator: Secondary | ICD-10-CM | POA: Diagnosis not present

## 2023-09-26 DIAGNOSIS — D500 Iron deficiency anemia secondary to blood loss (chronic): Secondary | ICD-10-CM

## 2023-09-26 DIAGNOSIS — Z87442 Personal history of urinary calculi: Secondary | ICD-10-CM | POA: Insufficient documentation

## 2023-09-26 DIAGNOSIS — Z79899 Other long term (current) drug therapy: Secondary | ICD-10-CM | POA: Insufficient documentation

## 2023-09-26 DIAGNOSIS — K5900 Constipation, unspecified: Secondary | ICD-10-CM | POA: Diagnosis not present

## 2023-09-26 DIAGNOSIS — J449 Chronic obstructive pulmonary disease, unspecified: Secondary | ICD-10-CM | POA: Insufficient documentation

## 2023-09-26 DIAGNOSIS — M129 Arthropathy, unspecified: Secondary | ICD-10-CM | POA: Insufficient documentation

## 2023-09-26 DIAGNOSIS — Z5112 Encounter for antineoplastic immunotherapy: Secondary | ICD-10-CM | POA: Insufficient documentation

## 2023-09-26 DIAGNOSIS — K7460 Unspecified cirrhosis of liver: Secondary | ICD-10-CM | POA: Diagnosis not present

## 2023-09-26 DIAGNOSIS — Z800 Family history of malignant neoplasm of digestive organs: Secondary | ICD-10-CM | POA: Insufficient documentation

## 2023-09-26 DIAGNOSIS — Z8041 Family history of malignant neoplasm of ovary: Secondary | ICD-10-CM | POA: Insufficient documentation

## 2023-09-26 DIAGNOSIS — C9000 Multiple myeloma not having achieved remission: Secondary | ICD-10-CM

## 2023-09-26 DIAGNOSIS — K921 Melena: Secondary | ICD-10-CM | POA: Diagnosis not present

## 2023-09-26 DIAGNOSIS — Z85828 Personal history of other malignant neoplasm of skin: Secondary | ICD-10-CM | POA: Insufficient documentation

## 2023-09-26 DIAGNOSIS — I10 Essential (primary) hypertension: Secondary | ICD-10-CM | POA: Insufficient documentation

## 2023-09-26 DIAGNOSIS — Z87891 Personal history of nicotine dependence: Secondary | ICD-10-CM | POA: Diagnosis not present

## 2023-09-26 DIAGNOSIS — E785 Hyperlipidemia, unspecified: Secondary | ICD-10-CM | POA: Insufficient documentation

## 2023-09-26 DIAGNOSIS — E876 Hypokalemia: Secondary | ICD-10-CM | POA: Diagnosis not present

## 2023-09-26 DIAGNOSIS — Z7985 Long-term (current) use of injectable non-insulin antidiabetic drugs: Secondary | ICD-10-CM | POA: Insufficient documentation

## 2023-09-26 DIAGNOSIS — Z86018 Personal history of other benign neoplasm: Secondary | ICD-10-CM | POA: Diagnosis not present

## 2023-09-26 LAB — CBC WITH DIFFERENTIAL/PLATELET
Abs Immature Granulocytes: 0.02 10*3/uL (ref 0.00–0.07)
Basophils Absolute: 0 10*3/uL (ref 0.0–0.1)
Basophils Relative: 0 %
Eosinophils Absolute: 0.1 10*3/uL (ref 0.0–0.5)
Eosinophils Relative: 1 %
HCT: 20.9 % — ABNORMAL LOW (ref 36.0–46.0)
Hemoglobin: 5.7 g/dL — CL (ref 12.0–15.0)
Immature Granulocytes: 0 %
Lymphocytes Relative: 34 %
Lymphs Abs: 1.6 10*3/uL (ref 0.7–4.0)
MCH: 30 pg (ref 26.0–34.0)
MCHC: 27.3 g/dL — ABNORMAL LOW (ref 30.0–36.0)
MCV: 110 fL — ABNORMAL HIGH (ref 80.0–100.0)
Monocytes Absolute: 0.3 10*3/uL (ref 0.1–1.0)
Monocytes Relative: 7 %
Neutro Abs: 2.7 10*3/uL (ref 1.7–7.7)
Neutrophils Relative %: 58 %
Platelets: 101 10*3/uL — ABNORMAL LOW (ref 150–400)
RBC: 1.9 MIL/uL — ABNORMAL LOW (ref 3.87–5.11)
RDW: 19.1 % — ABNORMAL HIGH (ref 11.5–15.5)
WBC: 4.7 10*3/uL (ref 4.0–10.5)
nRBC: 0.6 % — ABNORMAL HIGH (ref 0.0–0.2)

## 2023-09-26 LAB — COMPREHENSIVE METABOLIC PANEL WITH GFR
ALT: 15 U/L (ref 0–44)
AST: 21 U/L (ref 15–41)
Albumin: 2.9 g/dL — ABNORMAL LOW (ref 3.5–5.0)
Alkaline Phosphatase: 82 U/L (ref 38–126)
Anion gap: 7 (ref 5–15)
BUN: 15 mg/dL (ref 8–23)
CO2: 30 mmol/L (ref 22–32)
Calcium: 8.4 mg/dL — ABNORMAL LOW (ref 8.9–10.3)
Chloride: 103 mmol/L (ref 98–111)
Creatinine, Ser: 0.55 mg/dL (ref 0.44–1.00)
GFR, Estimated: >60 mL/min (ref >60)
Glucose, Bld: 206 mg/dL — ABNORMAL HIGH (ref 70–99)
Potassium: 3.5 mmol/L (ref 3.5–5.1)
Sodium: 140 mmol/L (ref 135–145)
Total Bilirubin: 0.3 mg/dL (ref 0.0–1.2)
Total Protein: 5.2 g/dL — ABNORMAL LOW (ref 6.5–8.1)

## 2023-09-26 LAB — PREPARE RBC (CROSSMATCH)

## 2023-09-26 LAB — SAMPLE TO BLOOD BANK

## 2023-09-26 LAB — MAGNESIUM: Magnesium: 1.9 mg/dL (ref 1.7–2.4)

## 2023-09-26 MED ORDER — HEPARIN SOD (PORK) LOCK FLUSH 100 UNIT/ML IV SOLN
500.0000 [IU] | Freq: Every day | INTRAVENOUS | Status: AC | PRN
  Administered 2023-09-26: 500 [IU]

## 2023-09-26 MED ORDER — DIPHENHYDRAMINE HCL 25 MG PO CAPS
25.0000 mg | ORAL_CAPSULE | Freq: Once | ORAL | Status: AC
  Administered 2023-09-26: 25 mg via ORAL
  Filled 2023-09-26: qty 1

## 2023-09-26 MED ORDER — SODIUM CHLORIDE 0.9% FLUSH
10.0000 mL | INTRAVENOUS | Status: AC | PRN
  Administered 2023-09-26: 10 mL

## 2023-09-26 MED ORDER — ACETAMINOPHEN 325 MG PO TABS: 650.0000 mg | ORAL_TABLET | Freq: Once | ORAL | Status: DC

## 2023-09-26 MED ORDER — SODIUM CHLORIDE 0.9 % IV SOLN: Freq: Once | INTRAVENOUS | Status: AC

## 2023-09-26 MED ORDER — SODIUM CHLORIDE 0.9% IV SOLUTION
250.0000 mL | INTRAVENOUS | Status: DC
  Administered 2023-09-26: 250 mL via INTRAVENOUS

## 2023-09-26 MED ORDER — SODIUM CHLORIDE 0.9 % IV SOLN
500.0000 mg | Freq: Once | INTRAVENOUS | Status: AC
  Administered 2023-09-26: 500 mg via INTRAVENOUS
  Filled 2023-09-26: qty 25

## 2023-09-26 MED ORDER — CYANOCOBALAMIN 1000 MCG/ML IJ SOLN
1000.0000 ug | Freq: Once | INTRAMUSCULAR | Status: AC
  Administered 2023-09-26: 1000 ug via INTRAMUSCULAR
  Filled 2023-09-26: qty 1

## 2023-09-26 MED ORDER — SODIUM CHLORIDE 0.9% FLUSH: 10.0000 mL | INTRAVENOUS | Status: DC | PRN

## 2023-09-26 MED ORDER — SODIUM CHLORIDE 0.9% FLUSH
10.0000 mL | Freq: Once | INTRAVENOUS | Status: AC
  Administered 2023-09-26: 10 mL via INTRAVENOUS

## 2023-09-26 NOTE — Progress Notes (Signed)
 Patient presents today for chemotherapy infusion. Patient is in satisfactory condition with no new complaints voiced.  Vital signs are stable.  Labs reviewed by Dr. Rogers during the office visit and all other labs are within treatment parameters. Patient's hemoglobin noted to be 5.7 today. Patient will receive 1 unit of blood, Venofer  500 mg, and B12 injection today.  We will hold treatment per MD orders. Patient will return tomorrow for 2 units of blood and treatment once the blood is finished.  Treatment given today per MD orders. Tolerated infusion without adverse affects. Vital signs stable. No complaints at this time. Discharged from clinic via wheelchair in stable condition. Alert and oriented x 3. F/U with [facility] as scheduled.

## 2023-09-26 NOTE — Progress Notes (Signed)
 Complex Care Management Note Care Guide Note  09/26/2023 Name: Savannah Burns MRN: [id number] DOB: February 05, 1951   Complex Care Management Outreach Attempts: A third unsuccessful outreach was attempted today to offer the patient with information about available complex care management services.  Follow Up Plan:  Additional outreach attempts will be made to offer the patient complex care management information and services.   Encounter Outcome: No answer   Sig :[facility] HealthPopulation Health Care Guide  Direct Dial:[phone] [phone] Website: [email or web address]

## 2023-09-26 NOTE — Progress Notes (Signed)
 CRITICAL VALUE ALERT Critical value received:  HGB 5.7.  Date of notification:  09-26-2023 Time of notification: 09:10 am.  Critical value read back:  Yes.   Nurse who received alert:  B.Lexine Jaspers RN  MD notified time and response:  Dr. Katragadda @ 09:10 am. Standing orders followed. Patient has antibodies.   Message received from Dr. Katragadda / A.Anderson RN give one unit of blood today. Emergent release. Least incompatible. Per S. Thompson in the lab , Dr. Belvie notified and okay to give 1 unit of blood today. 2 units of blood ordered for 09-27-2023 @ 08:45 am. Patient and blood bank aware.

## 2023-09-26 NOTE — Progress Notes (Signed)
 [facility] [address] [place] [address]    Clinic Day:  09/26/2023  Referring physician: Jolinda Norene HERO, DO  Patient Care Team: Savannah Norene HERO, DO as PCP - General (Family Medicine) Savannah Dorn FALCON, MD as PCP - Cardiology (Cardiology) Savannah Ned, MD (Rehabilitation) Savannah Oh, MD (Inactive) as Consulting Physician (Pulmonary Disease) Savannah Carlin POUR, DO as Consulting Physician (Internal Medicine) Savannah Parkin, NP as Nurse Practitioner (Nurse Practitioner) Savannah Frederick, MD as Referring Physician (Gastroenterology) Savannah Hai, MD as Medical Oncologist (Medical Oncology) Savannah Joesph SQUIBB, RN as Oncology Nurse Navigator (Medical Oncology) Savannah Rosina HERO, RN as VBCI Care Management   ASSESSMENT & PLAN:   Assessment: 1. 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  Orderly maturation of erythroid and myeloid series. - Myeloma FISH panel: t(11;14) - Cytogenetics: Failed to grow metaphases - PET scan (11/18/2021): No evidence of active myeloma or plasmacytoma.  Right upper lobe lung infection. - Skull x-rays (10/29/2021) multiple discrete round lucent/lytic lesions in the skull - Worsening M spike and free light chain ratio.  Normal creatinine and calcium .  Multifactorial anemia including blood loss and bone marrow infiltration. - Cycle 1 of DRd started on 12/14/2021 with lenalidomide  15 mg 3 weeks on/1 week off and dexamethasone  20 mg weekly - Hospitalized from 09/26/2022 through 10/03/2022 with MSSA bacteremia, port removed, cefepime started, to continue until 10/26/2022   2.  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  Quit smoking 15 years ago.  No exposure to chemicals or pesticides. - Paternal grandmother had ovarian cancer.  Sister had ovarian cancer.   3.  Cirrhosis: - Likely secondary to  NAFLD with splenomegaly resulting in mild thrombocytopenia.  Seen by transplant hepatology service at [facility].    Plan: 1.  Stage II IgG kappa myeloma, standard risk: - She is tolerating Darzalex  and Revlimid  reasonably well.  She is taking Revlimid  15 mg 3 weeks on/1 week off.  She has 5-6 more pills left. - We reviewed myeloma labs from 09/13/2023: M spike improved to 0.2 g from 0.3 g.  FLC ratio also improved to 2.0 from 2.07 and kappa light chains are normal at 13. - CBC today shows hemoglobin is 5.7 with platelet count 101.  We will hold her Darzalex  today.  I have recommended that she hold Revlimid  to prevent any further drop in platelet count. - Recommend follow-up in 4 weeks.   2.  Iron  deficiency anemia from chronic GI bleed: - She reported having black tarry stools for the last couple of weeks.  Last blood transfusion was 1 unit PRBC on 09/13/2023 for hemoglobin 7.3. - Today hemoglobin is 5.7.  We will arrange for a total of 3 units PRBC.  She will also receive Venofer  today. - She previously had small bowel enteroscopy in [facility] in 2023, with APC of angiodysplastic lesion.  She would like to have the procedure close to home.  We will make a referral to Dr. Eartha. - I have reached out to him.  He has kindly accepted to see this patient and attempt push enteroscopy.  He will also consider octreotide  20 mg every 4 weeks.   3.  Infection prophylaxis: - Continue acyclovir  400 mg twice daily.  Aspirin  on hold due to bleeding.   4.  Cirrhosis: - Continue Bumex  2 mg daily and metolazone  if needed.   5.  Hypokalemia: - Continue K-Dur 20 mEq 2 tablets twice daily.  Potassium is 3.5.    No orders of the defined types were placed in this encounter.     Savannah Burns,acting as a Neurosurgeon for Savannah Stands, MD.,have documented all relevant documentation on the behalf of Savannah Stands, MD,as directed by  Savannah Stands, MD while in the presence of Savannah Stands,  MD.  I, Savannah Stands MD, have reviewed the above documentation for accuracy and completeness, and I agree with the above.      Savannah Burns   7/1/20257:59 AM  CHIEF COMPLAINT:   Diagnosis: stage II standard risk IgG kappa plasma cell myeloma    Cancer Staging  Multiple myeloma (HCC) Staging form: Plasma Cell Myeloma and Plasma Cell Disorders, AJCC 8th Edition - Clinical stage from 11/30/2021: RISS Stage II (Beta-2 -microglobulin (mg/L): 2.8, Albumin (g/dL): 3, ISS: Stage II, High-risk cytogenetics: Absent, LDH: Normal) - Signed by Burns Alean, MD on 11/30/2021    Prior Therapy: none  Current Therapy:  Daratumumab , lenalidomide  and dexamethasone     HISTORY OF PRESENT ILLNESS:   Oncology History  Multiple myeloma (HCC)  11/30/2021 Initial Diagnosis   Multiple myeloma (HCC)   11/30/2021 Cancer Staging   Staging form: Plasma Cell Myeloma and Plasma Cell Disorders, AJCC 8th Edition - Clinical stage from 11/30/2021: RISS Stage II (Beta-2 -microglobulin (mg/L): 2.8, Albumin (g/dL): 3, ISS: Stage II, High-risk cytogenetics: Absent, LDH: Normal) - Signed by Burns Alean, MD on 11/30/2021 Histopathologic type: Multiple myeloma Stage prefix: Initial diagnosis Beta 2 microglobulin range (mg/L): Less than 3.5 Albumin range (g/dL): Less than 3.5 Cytogenetics: t(11;14) translocation Serum calcium  level: Normal   12/14/2021 -  Chemotherapy   Patient is on Treatment Plan : MYELOMA  Daratumumab  SQ + Lenalidomide  + Dexamethasone  (DaraRd) q28d        INTERVAL HISTORY:   Savannah Burns  is a 73 y.o. female presenting to clinic today for follow up of stage II standard risk IgG kappa plasma cell myeloma. She was last seen by me on 08/29/23.  Today, she states that she is doing well overall. Her appetite level is at 50%. Her energy level is at 0%. Savannah Burns  reports she is bleeding internally and has melena with tar like stools after every BM that began on 08/30/23, the day after her last  visit. She has associated and occasional constipation. She states she received 1 unit of blood on 08/29/23. Savannah Burns  notes severe fatigue, worsened when active.   She is taking Revlimid  as prescribed. She has 5 more days of her current cycle before her off week occurs. Isamar  has agreed to hold Revlimid  today and tomorrow due to low HGB.   PAST MEDICAL HISTORY:   Past Medical History: Past Medical History:  Diagnosis Date   Adrenal adenoma, left    Stable   Anxiety    Arthritis    bilateral hands   COPD (chronic obstructive pulmonary disease) (HCC)    Depression    Diabetes mellitus, type 2 (HCC) 08/12/2008   Qualifier: Diagnosis of  By: Jenetta MD, Talia     Dyspnea    Esophageal varices (HCC)    Grade II diastolic dysfunction    History of kidney stones    Hyperlipidemia    Hypertension    Lower back pain    Lower GI bleed 03/19/2020   Panic attacks    Pneumonia    currently taking antibiotic and prednisone  for early stages of pneumonia   Pulmonary nodules    bilateral  Skin cancer    face    Surgical History: Past Surgical History:  Procedure Laterality Date   BIOPSY  04/07/2020   Procedure: BIOPSY;  Surgeon: Savannah Carlin POUR, DO;  Location: [facility];  Service: Endoscopy;;   Breast Cystectomy  Right    CESAREAN SECTION     COLONOSCOPY WITH PROPOFOL  N/A 01/25/2020   Dr. Cindie: Nonbleeding internal hemorrhoids, diverticulosis, 5 mm polyp removed from the ascending colon, 10 mm polyp removed from the sigmoid colon, 30 mm polyp (tubulovillous adenoma with no high-grade dysplasia) removed from the transverse colon via piecemeal status post tattoo.  Other polyps were tubular adenomas.  3 month surveillance colonoscopy recommended.   COLONOSCOPY WITH PROPOFOL  N/A 04/07/2020   Procedure: COLONOSCOPY WITH PROPOFOL ;  Surgeon: Savannah Carlin POUR, DO;  Location: [facility];  Service: Endoscopy;  Laterality: N/A;  3:00pm, pt knows new time per office    CYSTOSCOPY/URETEROSCOPY/HOLMIUM LASER/STENT PLACEMENT Bilateral 03/01/2019   Procedure: CYSTOSCOPY/RETROGRADEURETEROSCOPY/HOLMIUM LASER/STENT PLACEMENT;  Surgeon: Devere Lonni Righter, MD;  Location: [facility];  Service: Urology;  Laterality: Bilateral;  ONLY NEEDS 60 MIN   ESOPHAGOGASTRODUODENOSCOPY (EGD) WITH PROPOFOL  N/A 01/25/2020   Dr. Cindie: 4 columns grade 1 esophageal varices   ESOPHAGOGASTRODUODENOSCOPY (EGD) WITH PROPOFOL  N/A 05/18/2020   Procedure: ESOPHAGOGASTRODUODENOSCOPY (EGD) WITH PROPOFOL ;  Surgeon: Savannah Carlin POUR, DO;  [facility]: [facility];  Service: Endoscopy;  Laterality: N/A;   ESOPHAGOGASTRODUODENOSCOPY (EGD) WITH PROPOFOL  N/A 07/28/2020   Procedure: ESOPHAGOGASTRODUODENOSCOPY (EGD) WITH PROPOFOL ;  Surgeon: Savannah Carlin POUR, DO;  [facility]: [facility];  Service: Endoscopy;  Laterality: N/A;  am or early PM due to givens capsule placement   GIVENS CAPSULE STUDY N/A 05/18/2020   Procedure: GIVENS CAPSULE STUDY;  Surgeon: Eartha Angelia Sieving, MD;  [facility]: [facility];  Service: Gastroenterology;  Laterality: N/A;   GIVENS CAPSULE STUDY N/A 07/28/2020   Procedure: GIVENS CAPSULE STUDY;  Surgeon: Savannah Carlin POUR, DO;  [facility];  Service: Endoscopy;  Laterality: N/A;   IR IMAGING GUIDED PORT INSERTION  12/02/2021   IRRIGATION AND DEBRIDEMENT ABSCESS Right 09/28/2022   Procedure: IRRIGATION AND DEBRIDEMENT ABSCESS;  Surgeon: Evonnie Dorothyann LABOR, DO;  Location: [facility];  Service: General;  Laterality: Right;   POLYPECTOMY  01/25/2020   Procedure: POLYPECTOMY;  Surgeon: Savannah Carlin POUR, DO;  [facility];  Service: Endoscopy;;   POLYPECTOMY  04/07/2020   Procedure: POLYPECTOMY INTESTINAL;  Surgeon: Savannah Carlin POUR, DO;  [facility];  Service: Endoscopy;;   PORT-A-CATH REMOVAL Right 09/28/2022   Procedure: MINOR REMOVAL PORT-A-CATH;  Surgeon: Evonnie Dorothyann LABOR, DO;  Location: [facility];  Service: General;  Laterality: Right;    PORTACATH PLACEMENT Right 04/12/2023   Procedure: INSERTION PORT-A-CATH, RIJ;  Surgeon: Evonnie Dorothyann LABOR, DO;  Location: [facility];  Service: General;  Laterality: Right;   SKIN CANCER EXCISION     Face   SPINE SURGERY     SUBMUCOSAL TATTOO INJECTION  01/25/2020   Procedure: SUBMUCOSAL TATTOO INJECTION;  Surgeon: Savannah Carlin POUR, DO;  [facility]: [facility];  Service: Endoscopy;;    Social History: Social History   Socioeconomic History   Marital status: Widowed    Spouse name: Not on file   Number of children: 2   Years of education: 14   Highest education level: Not on file  Occupational History   Occupation: Retired   Tobacco Use   Smoking status: Former    Current packs/day: 0.00    Average packs/day: 1.5  packs/day for 40.0 years (60.0 ttl pk-yrs)    Types: Cigarettes    Start date: 04/29/1975    Quit date: 04/29/2015    Years since quitting: 8.4   Smokeless tobacco: Never   Tobacco comments:    Quit smoking 04/2015- Previous 1.5 ppd smoker  Vaping Use   Vaping status: Never Used  Substance and Sexual Activity   Alcohol  use: No    Alcohol /week: 0.0 standard drinks of alcohol    Drug use: No   Sexual activity: Not Currently    Birth control/protection: Post-menopausal  Other Topics Concern   Not on file  Social History Narrative   Her 30 year old granddaughter lives with her - one daughter lives nearby, but she doesn't have a good relationship with her. Has a great relationship with other daughter who lives 1.5 hrs away - talks to her daily on the phone.   Social Drivers of Corporate investment banker Strain: Low Risk  (06/12/2023)   Overall Financial Resource Strain (CARDIA)    Difficulty of Paying Living Expenses: Not very hard  Food Insecurity: No Food Insecurity (09/20/2023)   Hunger Vital Sign    Worried About Running Out of Food in the Last Year: Never true    Ran Out of Food in the Last Year: Never true  Transportation Needs: No Transportation Needs  (09/20/2023)   PRAPARE - Administrator, Civil Service (Medical): No    Lack of Transportation (Non-Medical): No  Physical Activity: Inactive (01/27/2023)   Exercise Vital Sign    Days of Exercise per Week: 0 days    Minutes of Exercise per Session: 0 min  Stress: No Stress Concern Present (01/27/2023)   [employer] of Occupational Health - Occupational Stress Questionnaire    Feeling of Stress : Not at all  Social Connections: Socially Isolated (06/15/2023)   Social Connection and Isolation Panel    Frequency of Communication with Friends and Family: More than three times a week    Frequency of Social Gatherings with Friends and Family: More than three times a week    Attends Religious Services: Never    Database administrator or Organizations: No    Attends Banker Meetings: Never    Marital Status: Widowed  Intimate Partner Violence: Not At Risk (09/20/2023)   Humiliation, Afraid, Rape, and Kick questionnaire    Fear of Current or Ex-Partner: No    Emotionally Abused: No    Physically Abused: No    Sexually Abused: No    Family History: Family History  Problem Relation Age of Onset   Diabetes Father    Heart disease Father 74       MI   Hypertension Father    Anemia Mother        Transfusion dependent   COPD Sister    Cancer Paternal Grandmother 6       Pancreatic    Current Medications:  Current Outpatient Medications:    acetaminophen  (TYLENOL ) 325 MG tablet, Take 2 tablets (650 mg total) by mouth every 6 (six) hours as needed for mild pain (pain score 1-3) (or Fever >/= 101)., Disp: , Rfl:    albuterol  (PROVENTIL ) (2.5 MG/3ML) 0.083% nebulizer solution, Take 3 mLs (2.5 mg total) by nebulization every 4 (four) hours as needed for wheezing or shortness of breath., Disp: 75 mL, Rfl: 1   albuterol  (VENTOLIN  HFA) 108 (90 Base) MCG/ACT inhaler, Inhale 2 puffs into the lungs every 6 (six) hours as  needed for wheezing or shortness of breath.,  Disp: 8 g, Rfl: 3   bumetanide  (BUMEX ) 1 MG tablet, Take 2 tablets (2 mg total) by mouth daily., Disp: 60 tablet, Rfl: 2   carvedilol  (COREG ) 6.25 MG tablet, Take 1 tablet (6.25 mg total) by mouth 2 (two) times daily with a meal., Disp: 60 tablet, Rfl: 5   desvenlafaxine  (PRISTIQ ) 100 MG 24 hr tablet, Take 100 mg by mouth in the morning., Disp: , Rfl:    esomeprazole  (NEXIUM ) 40 MG capsule, Take 1 capsule (40 mg total) by mouth daily., Disp: 30 capsule, Rfl: 2   ferrous sulfate  325 (65 FE) MG tablet, Take 1 tablet (325 mg total) by mouth daily with breakfast., Disp: 30 tablet, Rfl: 3   fluticasone -salmeterol (ADVAIR) 100-50 MCG/ACT AEPB, Inhale 1 puff into the lungs 2 (two) times daily., Disp: 60 each, Rfl: 5   furosemide  (LASIX ) 40 MG tablet, Take 1 tablet (40 mg total) by mouth daily., Disp: 30 tablet, Rfl: 0   lenalidomide  (REVLIMID ) 15 MG capsule, TAKE 1 CAPSULE BY MOUTH EVERY DAY FOR 21 DAYS ON THEN 7 DAYS OFF, Disp: 21 capsule, Rfl: 0   lidocaine -prilocaine  (EMLA ) cream, Apply 1 Application topically as needed. Apply a small amount to port a cath site and cover with plastic wrap 1 hour prior to infusion appointments, Disp: 30 g, Rfl: 3   lovastatin  (MEVACOR ) 20 MG tablet, Take 1 tablet (20 mg total) by mouth at bedtime., Disp: 90 tablet, Rfl: 3   methocarbamol  (ROBAXIN ) 750 MG tablet, Take 1 tablet (750 mg total) by mouth 3 (three) times daily., Disp: 90 tablet, Rfl: 1   metolazone  (ZAROXOLYN ) 2.5 MG tablet, Take 1 tablet (2.5 mg total) by mouth daily as needed., Disp: 30 tablet, Rfl: 0   nystatin  (MYCOSTATIN /NYSTOP ) powder, Apply topically 2 (two) times daily., Disp: 15 g, Rfl: 1   oxyCODONE  (OXY IR/ROXICODONE ) 5 MG immediate release tablet, Take 1 tablet (5 mg total) by mouth every 8 (eight) hours as needed for severe pain (pain score 7-10)., Disp: 10 tablet, Rfl: 0   oxyCODONE -acetaminophen  (PERCOCET) 10-325 MG tablet, Take 0.5-1 tablets by mouth 4 (four) times daily as needed. (Patient not  taking: Reported on 09/20/2023), Disp: , Rfl:    OXYGEN , Inhale 3 L into the lungs continuous., Disp: , Rfl:    OZEMPIC , 0.25 OR 0.5 MG/DOSE, 2 MG/3ML SOPN, Inject 0.25 mg into the skin every 7 (seven) days., Disp: 9 mL, Rfl: 3   potassium chloride  SA (KLOR-CON  M) 20 MEQ tablet, Take 1 tablet (20 mEq total) by mouth 2 (two) times daily., Disp: 60 tablet, Rfl: 3   Allergies: Allergies  Allergen Reactions   Keflex  [Cephalexin ] Nausea And Vomiting    REVIEW OF SYSTEMS:   Review of Systems  Constitutional:  Negative for chills, fatigue and fever.  HENT:   Negative for lump/mass, mouth sores, nosebleeds, sore throat and trouble swallowing.   Eyes:  Negative for eye problems.  Respiratory:  Positive for shortness of breath. Negative for cough.   Cardiovascular:  Negative for chest pain, leg swelling and palpitations.  Gastrointestinal:  Positive for constipation. Negative for abdominal pain, diarrhea, nausea and vomiting.  Genitourinary:  Negative for bladder incontinence, difficulty urinating, dysuria, frequency, hematuria and nocturia.   Musculoskeletal:  Negative for arthralgias, back pain, flank pain, myalgias and neck pain.  Skin:  Negative for itching and rash.  Neurological:  Negative for dizziness, headaches and numbness.  Hematological:  Does not bruise/bleed easily.  Psychiatric/Behavioral:  Negative  for depression, sleep disturbance and suicidal ideas. The patient is not nervous/anxious.   All other systems reviewed and are negative.    VITALS:   There were no vitals taken for this visit.  Wt Readings from Last 3 Encounters:  08/29/23 298 lb 1 oz (135.2 kg)  08/01/23 299 lb 3.2 oz (135.7 kg)  07/04/23 275 lb 5.7 oz (124.9 kg)    There is no height or weight on file to calculate BMI.  Performance status (ECOG): 1 - Symptomatic but completely ambulatory  PHYSICAL EXAM:   Physical Exam Vitals and nursing note reviewed. Exam conducted with a chaperone present.   Constitutional:      Appearance: Normal appearance.   Cardiovascular:     Rate and Rhythm: Normal rate and regular rhythm.     Pulses: Normal pulses.     Heart sounds: Normal heart sounds.  Pulmonary:     Effort: Pulmonary effort is normal.     Breath sounds: Normal breath sounds.  Abdominal:     Palpations: Abdomen is soft. There is no hepatomegaly, splenomegaly or mass.     Tenderness: There is no abdominal tenderness.   Musculoskeletal:     Right lower leg: No edema.     Left lower leg: No edema.  Lymphadenopathy:     Cervical: No cervical adenopathy.     Right cervical: No superficial, deep or posterior cervical adenopathy.    Left cervical: No superficial, deep or posterior cervical adenopathy.     Upper Body:     Right upper body: No supraclavicular or axillary adenopathy.     Left upper body: No supraclavicular or axillary adenopathy.   Neurological:     General: No focal deficit present.     Mental Status: She is alert and oriented to person, place, and time.   Psychiatric:        Mood and Affect: Mood normal.        Behavior: Behavior normal.     LABS:   CBC     Component Value Date/Time   WBC 3.5 (L) 09/13/2023 0832   RBC 2.29 (L) 09/13/2023 0832   HGB 7.3 (L) 09/13/2023 0832   HGB 12.1 05/15/2023 0928   HCT 24.3 (L) 09/13/2023 0832   HCT 36.7 05/15/2023 0928   PLT 100 (L) 09/13/2023 0832   PLT 109 (L) 05/15/2023 0928   MCV 106.1 (H) 09/13/2023 0832   MCV 101 (H) 05/15/2023 0928   MCH 31.9 09/13/2023 0832   MCHC 30.0 09/13/2023 0832   RDW 16.7 (H) 09/13/2023 0832   RDW 13.1 05/15/2023 0928   LYMPHSABS 1.1 09/13/2023 0832   LYMPHSABS 1.8 06/24/2020 1529   MONOABS 0.4 09/13/2023 0832   EOSABS 0.0 09/13/2023 0832   EOSABS 0.1 06/24/2020 1529   BASOSABS 0.0 09/13/2023 0832   BASOSABS 0.0 06/24/2020 1529    CMP      Component Value Date/Time   NA 136 08/29/2023 0802   NA 146 (H) 05/15/2023 0928   K 3.5 08/29/2023 0802   CL 102 08/29/2023  0802   CO2 26 08/29/2023 0802   GLUCOSE 201 (H) 08/29/2023 0802   BUN 12 08/29/2023 0802   BUN 11 05/15/2023 0928   CREATININE 0.59 08/29/2023 0802   CREATININE 0.71 07/29/2015 1110   CALCIUM  8.1 (L) 08/29/2023 0802   CALCIUM  8.5 (L) 02/09/2023 0859   PROT 5.5 (L) 08/29/2023 0802   PROT 7.1 10/29/2019 1644   ALBUMIN 3.1 (L) 08/29/2023 0802   ALBUMIN 3.2 (L)  10/29/2019 1644   AST 20 08/29/2023 0802   ALT 12 08/29/2023 0802   ALKPHOS 97 08/29/2023 0802   BILITOT 0.5 08/29/2023 0802   BILITOT 0.2 10/29/2019 1644   GFRNONAA >60 08/29/2023 0802   GFRNONAA 102 10/19/2022 0920   GFRNONAA >89 07/29/2015 1110   GFRAA 96 02/07/2020 1415   GFRAA >89 07/29/2015 1110     No results found for: CEA1, CEA / No results found for: CEA1, CEA No results found for: PSA1 No results found for: CAN199 No results found for: CAN125  Lab Results  Component Value Date   TOTALPROTELP 4.9 (L) 09/13/2023   ALBUMINELP 3.0 09/13/2023   A1GS 0.2 09/13/2023   A2GS 0.6 09/13/2023   BETS 0.8 09/13/2023   GAMS 0.4 09/13/2023   MSPIKE 0.2 (H) 09/13/2023   SPEI Comment 09/13/2023   Lab Results  Component Value Date   TIBC 294 04/13/2023   TIBC 285 02/09/2023   TIBC 323 01/12/2023   FERRITIN 858 (H) 04/13/2023   FERRITIN 578 (H) 02/09/2023   FERRITIN 441 (H) 01/12/2023   IRONPCTSAT 32 (H) 04/13/2023   IRONPCTSAT 136 (H) 02/09/2023   IRONPCTSAT 28 01/12/2023   Lab Results  Component Value Date   LDH 92 (L) 10/22/2021   LDH 101 06/25/2021   LDH 103 03/24/2021     STUDIES:   No results found.

## 2023-09-26 NOTE — Progress Notes (Signed)
   09/26/23 1100  Spiritual Encounters  Type of Visit Initial  Care provided to: Patient  Conversation partners present during encounter Nurse  Referral source Patient request  Reason for visit Routine spiritual support  OnCall Visit No  Spiritual Framework  Presenting Themes Meaning/purpose/sources of inspiration;Values and beliefs;Significant life change;Impactful experiences and emotions  Community/Connection Family  Patient Stress Factors Family relationships;Health changes  Family Stress Factors Family relationships  Interventions  Spiritual Care Interventions Made Established relationship of care and support;Reflective listening;Narrative/life review  Spiritual Care Plan  Spiritual Care Issues Still Outstanding Chaplain will continue to follow   Reason for Visit: Chaplain making rounds on the floor visiting infusion Pts   Description of Visit: Arriving in the room I found Savannah Burns  in a recliner chair receiving treatment.   I introduced myself as the new chaplain for the cancer center and provided education to explain that the role of a chaplain is more than just a religious presence.  Savannah Burns  was receptive to my presence, and open to a conversation.   Savannah Burns  easily shared with me her connections to the hoptial and portions of her cancer journey.  She understands that her condition is not curable, but she feels confident in her physician's words that she is in a good place right now.   I encouraged more life review and Savannah Burns  shared her joy at having her granddaughter live with her and she has a new great-grandchild.  Seeing this grandchild was a motivating facture for her in her treatment.   Savannah Burns did mention several times that there is a brokeness in her relationship with one of her daughters and with other RNs in the room working it wasn;t the time to explore that.  I intend to follow-up with Savannah Burns  and explore more deeply if she is open.   Plan of Care: Savannah Burns does  appears to be well supported by her family. I do want to contiune to follow, however, and will plan to follow-up in approximatley 2 weeks.Savannah Burns   Savannah Burns, Savannah Burns   Chaplain, [facility]  [email or web address] [phone]

## 2023-09-26 NOTE — Patient Instructions (Signed)
 [facility] [place] - A DEPT OF [facility]  Discharge Instructions: Thank you for choosing [facility] to provide your oncology and hematology care.  If you have a lab appointment with the Cancer Center - please note that after April 8th, 2024, all labs will be drawn in the cancer center.  You do not have to check in or register with the main entrance as you have in the past but will complete your check-in in the cancer center.  Wear comfortable clothing and clothing appropriate for easy access to any Portacath or PICC line.   We strive to give you quality time with your provider. You may need to reschedule your appointment if you arrive late (15 or more minutes).  Arriving late affects you and other patients whose appointments are after yours.  Also, if you miss three or more appointments without notifying the office, you may be dismissed from the clinic at the provider's discretion.      For prescription refill requests, have your pharmacy contact our office and allow 72 hours for refills to be completed.    Today you received 1 unit of blood, B12 injection, and Venofer  500 mg IV iron  infusion.       BELOW ARE SYMPTOMS THAT SHOULD BE REPORTED IMMEDIATELY: *FEVER GREATER THAN 100.4 F (38 C) OR HIGHER *CHILLS OR SWEATING *NAUSEA AND VOMITING THAT IS NOT CONTROLLED WITH YOUR NAUSEA MEDICATION *UNUSUAL SHORTNESS OF BREATH *UNUSUAL BRUISING OR BLEEDING *URINARY PROBLEMS (pain or burning when urinating, or frequent urination) *BOWEL PROBLEMS (unusual diarrhea, constipation, pain near the anus) TENDERNESS IN MOUTH AND THROAT WITH OR WITHOUT PRESENCE OF ULCERS (sore throat, sores in mouth, or a toothache) UNUSUAL RASH, SWELLING OR PAIN  UNUSUAL VAGINAL DISCHARGE OR ITCHING   Items with * indicate a potential emergency and should be followed up as soon as possible or go to the Emergency Department if any problems should occur.  Please show the  CHEMOTHERAPY ALERT CARD or IMMUNOTHERAPY ALERT CARD at check-in to the Emergency Department and triage nurse.  Should you have questions after your visit or need to cancel or reschedule your appointment, please contact [facility] [place] - A DEPT OF [facility] [phone]  and follow the prompts.  Office hours are 8:00 a.m. to 4:30 p.m. Monday - Friday. Please note that voicemails left after 4:00 p.m. may not be returned until the following business day.  We are closed weekends and major holidays. You have access to a nurse at all times for urgent questions. Please call the main number to the clinic [phone] and follow the prompts.  For any non-urgent questions, you may also contact your provider using MyChart. We now offer e-Visits for anyone 67 and older to request care online for non-urgent symptoms. For details visit [email or web address].   Also download the MyChart app! Go to the app store, search MyChart, open the app, select [place], and log in with your MyChart username and password.

## 2023-09-27 ENCOUNTER — Inpatient Hospital Stay

## 2023-09-27 VITALS — BP 116/70 | HR 84 | Temp 97.0°F | Resp 20

## 2023-09-27 DIAGNOSIS — K921 Melena: Secondary | ICD-10-CM | POA: Diagnosis not present

## 2023-09-27 DIAGNOSIS — E876 Hypokalemia: Secondary | ICD-10-CM | POA: Diagnosis not present

## 2023-09-27 DIAGNOSIS — J449 Chronic obstructive pulmonary disease, unspecified: Secondary | ICD-10-CM | POA: Diagnosis not present

## 2023-09-27 DIAGNOSIS — K5900 Constipation, unspecified: Secondary | ICD-10-CM | POA: Diagnosis not present

## 2023-09-27 DIAGNOSIS — D500 Iron deficiency anemia secondary to blood loss (chronic): Secondary | ICD-10-CM | POA: Diagnosis not present

## 2023-09-27 DIAGNOSIS — K7460 Unspecified cirrhosis of liver: Secondary | ICD-10-CM | POA: Diagnosis not present

## 2023-09-27 DIAGNOSIS — Z79899 Other long term (current) drug therapy: Secondary | ICD-10-CM | POA: Diagnosis not present

## 2023-09-27 DIAGNOSIS — Z5112 Encounter for antineoplastic immunotherapy: Secondary | ICD-10-CM | POA: Diagnosis not present

## 2023-09-27 DIAGNOSIS — C9000 Multiple myeloma not having achieved remission: Secondary | ICD-10-CM | POA: Diagnosis not present

## 2023-09-27 MED ORDER — DIPHENHYDRAMINE HCL 25 MG PO CAPS
25.0000 mg | ORAL_CAPSULE | Freq: Once | ORAL | Status: AC
  Administered 2023-09-27: 25 mg via ORAL
  Filled 2023-09-27: qty 1

## 2023-09-27 MED ORDER — SODIUM CHLORIDE 0.9% FLUSH
10.0000 mL | INTRAVENOUS | Status: AC | PRN
  Administered 2023-09-27: 10 mL

## 2023-09-27 MED ORDER — HEPARIN SOD (PORK) LOCK FLUSH 100 UNIT/ML IV SOLN
500.0000 [IU] | Freq: Every day | INTRAVENOUS | Status: AC | PRN
  Administered 2023-09-27: 500 [IU]

## 2023-09-27 MED ORDER — DARATUMUMAB-HYALURONIDASE-FIHJ 1800-30000 MG-UT/15ML ~~LOC~~ SOLN
1800.0000 mg | Freq: Once | SUBCUTANEOUS | Status: AC
  Administered 2023-09-27: 1800 mg via SUBCUTANEOUS
  Filled 2023-09-27: qty 15

## 2023-09-27 MED ORDER — DIPHENHYDRAMINE HCL 25 MG PO CAPS
50.0000 mg | ORAL_CAPSULE | Freq: Once | ORAL | Status: DC
Start: 2023-09-27 — End: 2023-09-27

## 2023-09-27 MED ORDER — SODIUM CHLORIDE 0.9% IV SOLUTION
250.0000 mL | INTRAVENOUS | Status: DC
  Administered 2023-09-27: 250 mL via INTRAVENOUS

## 2023-09-27 MED ORDER — ACETAMINOPHEN 325 MG PO TABS
650.0000 mg | ORAL_TABLET | Freq: Once | ORAL | Status: AC
  Administered 2023-09-27: 650 mg via ORAL
  Filled 2023-09-27: qty 2

## 2023-09-27 NOTE — Progress Notes (Signed)
 Pt presents to cancer center for two blood transfusions and treatment. Pt tolerated RBC transfusions well with no signs of complications. Blood transfusion given per protocol. Vitals stable pre, during, and post transfusion. RN educated pt on the importance of notifying the clinic if any complications occur and when to seek emergency care. Pt verbalized understanding and all questions answered at this time.   Patient tolerated Daratumumab  injection with no complaints voiced.  See MAR for details.  Labs reviewed. Injection site clean and dry with no bruising or swelling noted at site.  Band aid applied.  Vss with discharge and left in satisfactory condition with no s/s of distress noted. All follow ups as scheduled.   Savannah Burns

## 2023-09-27 NOTE — Progress Notes (Signed)
 UNMATCHED BLOOD PRODUCT NOTE  Compare the patient ID on the blood tag to the patient ID on the hospital armband and Blood Bank armband. Then confirm the unit number on the blood tag matches the unit number on the blood product.  If a discrepancy is discovered return the product to blood bank immediately.   Blood Product Type: Packed Red Blood Cells  Unit #: (Found on blood product bag, begins with W) [id number]  Product Code #: (Found on blood product bag, begins with E) [id number]   Start Time: 09:35 am.  Starting Rate: 120 ml/hr  Rate increase/decreased  (if applicable): 275      ml/hr  Rate changed time (if applicable): 09:50   Stop Time: 11:15 am    All Other Documentation should be documented within the Blood Admin Flowsheet per policy.

## 2023-09-27 NOTE — Progress Notes (Signed)
 UNMATCHED BLOOD PRODUCT NOTE  Compare the patient ID on the blood tag to the patient ID on the hospital armband and Blood Bank armband. Then confirm the unit number on the blood tag matches the unit number on the blood product.  If a discrepancy is discovered return the product to blood bank immediately.   Blood Product Type: Packed Red Blood Cells  Unit #: (Found on blood product bag, begins with W) [id number]  Product Code #: (Found on blood product bag, begins with E) [id number]   Start Time: 1128  Starting Rate: 120 ml/hr  Rate increase/decreased  (if applicable):      225 ml/hr  Rate changed time (if applicable): 1143   Stop Time: 1325   All Other Documentation should be documented within the Blood Admin Flowsheet per policy.

## 2023-09-27 NOTE — Patient Instructions (Signed)
 [facility] [place] - A DEPT OF [facility]  Discharge Instructions: Thank you for choosing [facility] to provide your oncology and hematology care.  If you have a lab appointment with the Cancer Center - please note that after April 8th, 2024, all labs will be drawn in the cancer center.  You do not have to check in or register with the main entrance as you have in the past but will complete your check-in in the cancer center.  Wear comfortable clothing and clothing appropriate for easy access to any Portacath or PICC line.   We strive to give you quality time with your provider. You may need to reschedule your appointment if you arrive late (15 or more minutes).  Arriving late affects you and other patients whose appointments are after yours.  Also, if you miss three or more appointments without notifying the office, you may be dismissed from the clinic at the provider's discretion.      For prescription refill requests, have your pharmacy contact our office and allow 72 hours for refills to be completed.    Today you received the following chemotherapy and/or immunotherapy agents Darzalex  SQ   To help prevent nausea and vomiting after your treatment, we encourage you to take your nausea medication as directed.  BELOW ARE SYMPTOMS THAT SHOULD BE REPORTED IMMEDIATELY: *FEVER GREATER THAN 100.4 F (38 C) OR HIGHER *CHILLS OR SWEATING *NAUSEA AND VOMITING THAT IS NOT CONTROLLED WITH YOUR NAUSEA MEDICATION *UNUSUAL SHORTNESS OF BREATH *UNUSUAL BRUISING OR BLEEDING *URINARY PROBLEMS (pain or burning when urinating, or frequent urination) *BOWEL PROBLEMS (unusual diarrhea, constipation, pain near the anus) TENDERNESS IN MOUTH AND THROAT WITH OR WITHOUT PRESENCE OF ULCERS (sore throat, sores in mouth, or a toothache) UNUSUAL RASH, SWELLING OR PAIN  UNUSUAL VAGINAL DISCHARGE OR ITCHING   Items with * indicate a potential emergency and should be followed up  as soon as possible or go to the Emergency Department if any problems should occur.  Please show the CHEMOTHERAPY ALERT CARD or IMMUNOTHERAPY ALERT CARD at check-in to the Emergency Department and triage nurse.  Should you have questions after your visit or need to cancel or reschedule your appointment, please contact [facility] [place] - A DEPT OF [facility] [phone]  and follow the prompts.  Office hours are 8:00 a.m. to 4:30 p.m. Monday - Friday. Please note that voicemails left after 4:00 p.m. may not be returned until the following business day.  We are closed weekends and major holidays. You have access to a nurse at all times for urgent questions. Please call the main number to the clinic [phone] and follow the prompts.  For any non-urgent questions, you may also contact your provider using MyChart. We now offer e-Visits for anyone 68 and older to request care online for non-urgent symptoms. For details visit [email or web address].   Also download the MyChart app! Go to the app store, search MyChart, open the app, select [facility], and log in with your MyChart username and password.

## 2023-09-28 LAB — BPAM RBC
Blood Product Expiration Date: 202507182359
Blood Product Expiration Date: 202507182359
Blood Product Expiration Date: 202507212359
ISSUE DATE / TIME: 202507011014
ISSUE DATE / TIME: 202507011014
PRODUCT CODE: 202507020927
Unit Type and Rh: 5100
Unit Type and Rh: 5100
Unit Type and Rh: 6200

## 2023-09-28 LAB — TYPE AND SCREEN
ABO/RH(D): A POS
Antibody Screen: POSITIVE
Donor AG Type: NEGATIVE
Donor AG Type: NEGATIVE
Donor AG Type: NEGATIVE
Unit division: 0
Unit division: 0
Unit division: 0

## 2023-10-03 ENCOUNTER — Encounter: Payer: Self-pay | Admitting: *Deleted

## 2023-10-03 ENCOUNTER — Telehealth: Payer: Self-pay | Admitting: *Deleted

## 2023-10-05 ENCOUNTER — Telehealth (INDEPENDENT_AMBULATORY_CARE_PROVIDER_SITE_OTHER): Payer: Self-pay | Admitting: *Deleted

## 2023-10-05 ENCOUNTER — Ambulatory Visit (INDEPENDENT_AMBULATORY_CARE_PROVIDER_SITE_OTHER): Admitting: Gastroenterology

## 2023-10-05 ENCOUNTER — Encounter (INDEPENDENT_AMBULATORY_CARE_PROVIDER_SITE_OTHER): Payer: Self-pay | Admitting: Gastroenterology

## 2023-10-05 VITALS — BP 150/80 | HR 97 | Temp 100.0°F | Ht 62.0 in | Wt 302.3 lb

## 2023-10-05 DIAGNOSIS — R140 Abdominal distension (gaseous): Secondary | ICD-10-CM | POA: Diagnosis not present

## 2023-10-05 DIAGNOSIS — I509 Heart failure, unspecified: Secondary | ICD-10-CM

## 2023-10-05 DIAGNOSIS — I8500 Esophageal varices without bleeding: Secondary | ICD-10-CM

## 2023-10-05 DIAGNOSIS — K921 Melena: Secondary | ICD-10-CM

## 2023-10-05 DIAGNOSIS — D509 Iron deficiency anemia, unspecified: Secondary | ICD-10-CM

## 2023-10-05 DIAGNOSIS — K7460 Unspecified cirrhosis of liver: Secondary | ICD-10-CM | POA: Diagnosis not present

## 2023-10-05 DIAGNOSIS — I8510 Secondary esophageal varices without bleeding: Secondary | ICD-10-CM

## 2023-10-05 DIAGNOSIS — K766 Portal hypertension: Secondary | ICD-10-CM | POA: Diagnosis not present

## 2023-10-05 DIAGNOSIS — I5032 Chronic diastolic (congestive) heart failure: Secondary | ICD-10-CM

## 2023-10-05 DIAGNOSIS — K219 Gastro-esophageal reflux disease without esophagitis: Secondary | ICD-10-CM

## 2023-10-05 DIAGNOSIS — E278 Other specified disorders of adrenal gland: Secondary | ICD-10-CM

## 2023-10-05 DIAGNOSIS — J449 Chronic obstructive pulmonary disease, unspecified: Secondary | ICD-10-CM

## 2023-10-05 DIAGNOSIS — K7469 Other cirrhosis of liver: Secondary | ICD-10-CM

## 2023-10-05 MED ORDER — ESOMEPRAZOLE MAGNESIUM 40 MG PO CPDR: 40.0000 mg | DELAYED_RELEASE_CAPSULE | Freq: Two times a day (BID) | ORAL | 3 refills | Status: DC

## 2023-10-05 NOTE — Progress Notes (Signed)
 Savannah Burns, M.D. Gastroenterology & Hepatology [facility]enter[facility] [address] [place], [place] [address] Primary Care Physician: Jolinda Norene HERO, DO [address] [place] [address]  Referring MD: PCP  Chief Complaint: recurrent iron  deficiency anemia and anemia.  History of Present Illness: Savannah  Savannah Burns is a 73 y.o. female with past medical history of adrenal adenoma, diabetes, depression, COPD on continuous supplemental oxygen , multiple myeloma, who presents for evaluation of recurrent iron  deficiency anemia and melena.  Patient was recently seen by Dr. Rogers who referred her for evaluation at our clinic given recurrent issues with anemia and melena.  Patient reports that for the last 1.5-2 months she has presented recurrent issues with melena on a regular basis. She states that she is having constipation recently, as she may have a BM every 2-3 days. She takes an OTC laxative but it is unknown which brand.The patient denies having any nausea, vomiting, fever, chills, hematochezia, hematemesis, abdominal distention, abdominal pain, diarrhea, jaundice, pruritus or weight loss.  Does not take any NSAIDs or anticoagulation. Stopped taking aspirin .  The patient is a former patient of Dr. Cindie.  Last seen by him office was on 07/01/2020.  She was scheduled for EGD with capsule deployment for evaluation of small bowel.  Patient was seen at [facility] on 07/21/2021 (Dr. Gaile).  She was being evaluated for possible retrograde double-balloon enteroscopy after being found to have possible blood in capsule study.  Due to recurrent bleeding, she was admitted to [facility] after he clinic visit  -workup at that time showed normal esophagogastroduodenospy. 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   Patient achieved hemostasis and did not have any more episodes of melena.  Most recent labs from 09/26/2023 showed a CMP with AST 21, ALT 15, albumin 2.9, magnesium  1.9, normal electrolytes with sodium of 140, normal renal function with creatinine of 0.5, CBC with hemoglobin of 5.7, WBC 4.7, platelets 101.  Patient was transfused 3 units PRBC.  Cirrhosis related questions: Hematemesis/coffee ground emesis: No Abdominal pain: No Abdominal distention/worsening ascites; Has noted some abdominal distention recently Fever/chills: No Episodes of confusion/disorientation: No Number of daily bowel movements:every 2-3 days Taking diuretics?: Lasix  40 mg qday History of variceal bleeding: No Prior history of banding?: No Prior episodes of SBP: No Last time liver imaging was performed: CT  liver 10/2021 -presence of cirrhosis without masses, splenomegaly, there was presence of a 3 cm left adrenal mass Last AFP:  none available MELD 3.0 score: no recent labs including INR Currently consuming alcohol : No Hepatitis A and B vaccination status: unknown  Last EGD: As above Last Colonoscopy:04/07/2020 - Preparation of the colon was fair. - Non- bleeding internal hemorrhoids. - Diverticulosis in the sigmoid colon and in the descending colon. - One 10 mm polyp in the cecum, removed with mucosal resection. Resected and retrieved. - One 7 mm polyp in the ascending colon, removed with a hot snare. Resected and retrieved. - One 18 mm polyp at the hepatic flexure, removed with a hot snare. Resected and retrieved. - Post- polypectomy scar in the transverse colon. Biopsied. - Mucosal resection was performed. Resection and retrieval were complete.  Path: A. COLON, CECAL, POLYPECTOMY:  - Sessile serrated adenoma without cytologic dysplasia.   B. COLON, ASCENDING, POLYPECTOMY:  - Fragments of tubular adenoma.  - No high-grade dysplasia or carcinoma.   C.  COLON, POSTPOLYPECTOMY SITE, BIOPSY:  - Colonic mucosa with  focal fibrosis.  - No adenomatous change or carcinoma.   Recommend a repeat colonoscopy in 3 years  FHx: neg for any gastrointestinal/liver disease, grandmother had some type of cancer, sister had GYN cancer Social: quit smoking 15 years ago, neg alcohol  or illicit drug use Surgical: no abdominal surgeries  Past Medical History: Past Medical History:  Diagnosis Date   Adrenal adenoma, left    Stable   Anxiety    Arthritis    bilateral hands   COPD (chronic obstructive pulmonary disease) (HCC)    Depression    Diabetes mellitus, type 2 (HCC) 08/12/2008   Qualifier: Diagnosis of  By: Jenetta MD, Talia     Dyspnea    Esophageal varices (HCC)    Grade II diastolic dysfunction    History of kidney stones    Hyperlipidemia    Hypertension    Lower back pain    Lower GI bleed 03/19/2020   Panic attacks    Pneumonia    currently taking antibiotic and prednisone  for early stages of pneumonia   Pulmonary nodules    bilateral   Skin cancer    face    Past Surgical History: Past Surgical History:  Procedure Laterality Date   BIOPSY  04/07/2020   Procedure: BIOPSY;  Surgeon: Cindie Carlin POUR, DO;  [facility];  Service: Endoscopy;;   Breast Cystectomy  Right    CESAREAN SECTION     COLONOSCOPY WITH PROPOFOL  N/A 01/25/2020   Dr. Cindie: Nonbleeding internal hemorrhoids, diverticulosis, 5 mm polyp removed from the ascending colon, 10 mm polyp removed from the sigmoid colon, 30 mm polyp (tubulovillous adenoma with no high-grade dysplasia) removed from the transverse colon via piecemeal status post tattoo.  Other polyps were tubular adenomas.  3 month surveillance colonoscopy recommended.   COLONOSCOPY WITH PROPOFOL  N/A 04/07/2020   Procedure: COLONOSCOPY WITH PROPOFOL ;  Surgeon: Cindie Carlin POUR, DO;  [facility];  Service: Endoscopy;  Laterality: N/A;  3:00pm, pt knows new time per office   CYSTOSCOPY/URETEROSCOPY/HOLMIUM LASER/STENT PLACEMENT Bilateral  03/01/2019   Procedure: CYSTOSCOPY/RETROGRADEURETEROSCOPY/HOLMIUM LASER/STENT PLACEMENT;  Surgeon: Devere Lonni Righter, MD;  Location: [facility];  Service: Urology;  Laterality: Bilateral;  ONLY NEEDS 60 MIN   ESOPHAGOGASTRODUODENOSCOPY (EGD) WITH PROPOFOL  N/A 01/25/2020   Dr. Cindie: 4 columns grade 1 esophageal varices   ESOPHAGOGASTRODUODENOSCOPY (EGD) WITH PROPOFOL  N/A 05/18/2020   Procedure: ESOPHAGOGASTRODUODENOSCOPY (EGD) WITH PROPOFOL ;  Surgeon: Cindie Carlin POUR, DO;  [facility];  Service: Endoscopy;  Laterality: N/A;   ESOPHAGOGASTRODUODENOSCOPY (EGD) WITH PROPOFOL  N/A 07/28/2020   Procedure: ESOPHAGOGASTRODUODENOSCOPY (EGD) WITH PROPOFOL ;  Surgeon: Cindie Carlin POUR, DO;  [facility];  Service: Endoscopy;  Laterality: N/A;  am or early PM due to givens capsule placement   GIVENS CAPSULE STUDY N/A 05/18/2020   Procedure: GIVENS CAPSULE STUDY;  Surgeon: Eartha Angelia Sieving, MD;  [facility];  Service: Gastroenterology;  Laterality: N/A;   GIVENS CAPSULE STUDY N/A 07/28/2020   Procedure: GIVENS CAPSULE STUDY;  Surgeon: Cindie Carlin POUR, DO;  [facility];  Service: Endoscopy;  Laterality: N/A;   IR IMAGING GUIDED PORT INSERTION  12/02/2021   IRRIGATION AND DEBRIDEMENT ABSCESS Right 09/28/2022   Procedure: IRRIGATION AND DEBRIDEMENT ABSCESS;  Surgeon: Evonnie Dorothyann LABOR, DO;  Location: [facility];  Service: General;  Laterality: Right;   POLYPECTOMY  01/25/2020   Procedure: POLYPECTOMY;  Surgeon: Cindie Carlin POUR, DO;  [facility];  Service: Endoscopy;;   POLYPECTOMY  04/07/2020   Procedure: POLYPECTOMY INTESTINAL;  Surgeon: Cindie Carlin POUR, DO;  [facility];  Service: Endoscopy;;   PORT-A-CATH REMOVAL Right 09/28/2022   Procedure: MINOR REMOVAL PORT-A-CATH;  Surgeon: Evonnie Dorothyann LABOR, DO;  Location: [facility];  Service: General;  Laterality: Right;   PORTACATH PLACEMENT Right 04/12/2023   Procedure: INSERTION  PORT-A-CATH, RIJ;  Surgeon: Evonnie Dorothyann LABOR, DO;  Location: [facility];  Service: General;  Laterality: Right;   SKIN CANCER EXCISION     Face   SPINE SURGERY     SUBMUCOSAL TATTOO INJECTION  01/25/2020   Procedure: SUBMUCOSAL TATTOO INJECTION;  Surgeon: Cindie Carlin POUR, DO;  [facility];  Service: Endoscopy;;    Family History: Family History  Problem Relation Age of Onset   Diabetes Father    Heart disease Father 84       MI   Hypertension Father    Anemia Mother        Transfusion dependent   COPD Sister    Cancer Paternal Grandmother 36       Pancreatic    Social History: Social History   Tobacco Use  Smoking Status Former   Current packs/day: 0.00   Average packs/day: 1.5 packs/day for 40.0 years (60.0 ttl pk-yrs)   Types: Cigarettes   Start date: 04/29/1975   Quit date: 04/29/2015   Years since quitting: 8.4  Smokeless Tobacco Never  Tobacco Comments   Quit smoking 04/2015- Previous 1.5 ppd smoker   Social History   Substance and Sexual Activity  Alcohol  Use No   Alcohol /week: 0.0 standard drinks of alcohol    Social History   Substance and Sexual Activity  Drug Use No    Allergies: Allergies  Allergen Reactions   Keflex  [Cephalexin ] Nausea And Vomiting    Medications: Current Outpatient Medications  Medication Sig Dispense Refill   acetaminophen  (TYLENOL ) 325 MG tablet Take 2 tablets (650 mg total) by mouth every 6 (six) hours as needed for mild pain (pain score 1-3) (or Fever >/= 101).     albuterol  (PROVENTIL ) (2.5 MG/3ML) 0.083% nebulizer solution Take 3 mLs (2.5 mg total) by nebulization every 4 (four) hours as needed for wheezing or shortness of breath. 75 mL 1   albuterol  (VENTOLIN  HFA) 108 (90 Base) MCG/ACT inhaler Inhale 2 puffs into the lungs every 6 (six) hours as needed for wheezing or shortness of breath. 8 g 3   bumetanide  (BUMEX ) 1 MG tablet Take 2 tablets (2 mg total) by mouth daily. 60 tablet 2   carvedilol  (COREG ) 6.25  MG tablet Take 1 tablet (6.25 mg total) by mouth 2 (two) times daily with a meal. 60 tablet 5   desvenlafaxine  (PRISTIQ ) 100 MG 24 hr tablet Take 100 mg by mouth in the morning.     esomeprazole  (NEXIUM ) 40 MG capsule Take 1 capsule (40 mg total) by mouth daily. 30 capsule 2   ferrous sulfate  325 (65 FE) MG tablet Take 1 tablet (325 mg total) by mouth daily with breakfast. 30 tablet 3   fluticasone -salmeterol (ADVAIR) 100-50 MCG/ACT AEPB Inhale 1 puff into the lungs 2 (two) times daily. 60 each 5   furosemide  (LASIX ) 40 MG tablet Take 1 tablet (40 mg total) by mouth daily. 30 tablet 0   lenalidomide  (REVLIMID ) 15 MG capsule TAKE 1 CAPSULE BY MOUTH EVERY DAY FOR 21 DAYS ON THEN 7 DAYS OFF 21 capsule 0   lidocaine -prilocaine  (EMLA ) cream Apply 1  Application topically as needed. Apply a small amount to port a cath site and cover with plastic wrap 1 hour prior to infusion appointments 30 g 3   lovastatin  (MEVACOR ) 20 MG tablet Take 1 tablet (20 mg total) by mouth at bedtime. 90 tablet 3   methocarbamol  (ROBAXIN ) 750 MG tablet Take 1 tablet (750 mg total) by mouth 3 (three) times daily. 90 tablet 1   metolazone  (ZAROXOLYN ) 2.5 MG tablet Take 1 tablet (2.5 mg total) by mouth daily as needed. 30 tablet 0   nystatin  (MYCOSTATIN /NYSTOP ) powder Apply topically 2 (two) times daily. 15 g 1   oxyCODONE  (OXY IR/ROXICODONE ) 5 MG immediate release tablet Take 1 tablet (5 mg total) by mouth every 8 (eight) hours as needed for severe pain (pain score 7-10). 10 tablet 0   OXYGEN  Inhale 3 L into the lungs continuous.     OZEMPIC , 0.25 OR 0.5 MG/DOSE, 2 MG/3ML SOPN Inject 0.25 mg into the skin every 7 (seven) days. 9 mL 3   potassium chloride  SA (KLOR-CON  M) 20 MEQ tablet Take 1 tablet (20 mEq total) by mouth 2 (two) times daily. 60 tablet 3   No current facility-administered medications for this visit.    Review of Systems: GENERAL: negative for malaise, night sweats HEENT: No changes in hearing or vision, no nose  bleeds or other nasal problems. NECK: Negative for lumps, goiter, pain and significant neck swelling RESPIRATORY: Negative for cough, wheezing CARDIOVASCULAR: Negative for chest pain, leg swelling, palpitations, orthopnea GI: SEE HPI MUSCULOSKELETAL: Negative for joint pain or swelling, back pain, and muscle pain. SKIN: Negative for lesions, rash PSYCH: Negative for sleep disturbance, mood disorder and recent psychosocial stressors. HEMATOLOGY Negative for prolonged bleeding, bruising easily, and swollen nodes. ENDOCRINE: Negative for cold or heat intolerance, polyuria, polydipsia and goiter. NEURO: negative for tremor, gait imbalance, syncope and seizures. The remainder of the review of systems is noncontributory.   Physical Exam: BP (!) 150/80 (BP Location: Left Arm, Patient Position: Sitting, Cuff Size: Normal)   Pulse 97   Temp 100 F (37.8 C) (Oral)   Ht 5' 2 (1.575 m)   Wt (!) 302 lb 4.8 oz (137.1 kg)   BMI 55.29 kg/m GENERAL: The patient is AO x3, in no acute distress.   On supplemental oxygen . HEENT: Head is normocephalic and atraumatic. EOMI are intact. Mouth is well hydrated and without lesions. NECK: Supple. No masses LUNGS: Decreased breath sounds bilaterally HEART: RRR, normal s1 and s2. ABDOMEN: Soft, nontender, no guarding, no peritoneal signs, and mildly distended.  BS +. No masses. EXTREMITIES: Without any cyanosis, clubbing, rash, lesions.  Presence of +3 edema bilaterally up to both mid thighs NEUROLOGIC: AOx3, no focal motor deficit. SKIN: no jaundice, no rashes   Imaging/Labs: as above  I personally reviewed and interpreted the available labs, imaging and endoscopic files.  Impression and Plan: Savannah  Savannah Burns is a 73 y.o. female with past medical history of adrenal adenoma, diabetes, depression, COPD on continuous supplemental oxygen , multiple myeloma, who presents for evaluation of recurrent iron  deficiency anemia and melena.  Patient has presented  with continuing issues with melena.  Given her history of bleeding AVM, it is very possible she has had recurrence of the AVMs, especially in the setting of cirrhosis.  We will recheck a CBC as soon as possible.  I discussed with the patient the need to proceed with an enteroscopy soon.  However, given her current cardiopulmonary status due to COPD and CHF, she will need to  have optimization of these systems before proceeding to perform endoscopic evaluation and management.  The patient understood this.  We will send referrals for cardiology and pulmonology as she does not have any of these providers.  I also advised her that if her labs were to show worsening anemia requiring further transfusion or if she were to have medical instability, she should go to the ER to have further evaluation. Will discuss enteroscopy and eventual colonoscopy in next appointment once patient is clinically optimized.  For now, we will start her on esomeprazole  40 mg twice a day.  Regarding her cirrhosis, the patient has not presented any decompensating events but had evidence of portal hypertension given the presence of grade 1 esophageal varices.  No signs of hepatic encephalopathy.  She has some mild abdominal distention, which I believe is related to wall edema in the setting of decompensated heart failure.  I encouraged her to restart her diuretics as she has not been compliant with her furosemide .  We will obtain repeat MELD labs and hepatitis A/B antibodies.  She is due for [facility] screening, we will check AFP and given body habitus we will perform an MRI of the liver and abdomen -this will also help us  evaluate previous abdominal mass.  - Check CBC , MELD labs and AFP , Hep A/B ab - patient already has appointment for this - Schedule liver MRI - Reduce salt intake to <2 g per day -Restart taking furosemide  as prior - Can take Tylenol  max of 2 g per day (650 mg q8h) for pain - Avoid NSAIDs for pain - Avoid eating raw  oysters/shellfish - Protein shake (Ensure or Boost) every night before going to sleep - Start esomeprazole  40 mg twice a day - Pulmonology and cardiology referral - will need to obtain clearance to proceed with push enteroscopy once medically optimized - Follow-up closely with hematology -If presenting worsening anemia requiring further transfusion, I advised her to come to the ER to have further evaluation  All questions were answered.      Savannah Fortune, MD Gastroenterology and Hepatology [facility]

## 2023-10-05 NOTE — Telephone Encounter (Signed)
 [facility] PA for MRI:  Pending: In clinical review Tracking [id number]

## 2023-10-05 NOTE — Telephone Encounter (Signed)
 [facility] PA for MRI:  Approved Authorization #[id number]  Tracking #[id number] Dates of service 10/05/2023 - 12/04/2023

## 2023-10-05 NOTE — Patient Instructions (Addendum)
-   Check CBC , MELD labs and AFP , Hep A/B ab - patient already has appointment for this - Schedule liver MRI - Reduce salt intake to <2 g per day - Can take Tylenol  max of 2 g per day (650 mg q8h) for pain - Avoid NSAIDs for pain - Avoid eating raw oysters/shellfish - Protein shake (Ensure or Boost) every night before going to sleep - Start esomeprazole  40 mg twice a day - Pulmonology and cardiology referral - will need to obtain clearance to proceed with push enteroscopy once medically optimized - Follow-up closely with hematology -If presenting worsening anemia requiring further transfusion, I advised her to come to the ER to have further evaluation

## 2023-10-06 DIAGNOSIS — I8500 Esophageal varices without bleeding: Secondary | ICD-10-CM | POA: Insufficient documentation

## 2023-10-06 DIAGNOSIS — E278 Other specified disorders of adrenal gland: Secondary | ICD-10-CM | POA: Insufficient documentation

## 2023-10-09 ENCOUNTER — Ambulatory Visit (HOSPITAL_COMMUNITY): Admission: RE | Admit: 2023-10-09 | Source: Ambulatory Visit

## 2023-10-10 ENCOUNTER — Inpatient Hospital Stay

## 2023-10-10 VITALS — BP 122/72 | HR 76 | Temp 97.5°F | Resp 18

## 2023-10-10 DIAGNOSIS — C9000 Multiple myeloma not having achieved remission: Secondary | ICD-10-CM | POA: Diagnosis not present

## 2023-10-10 DIAGNOSIS — J449 Chronic obstructive pulmonary disease, unspecified: Secondary | ICD-10-CM | POA: Diagnosis not present

## 2023-10-10 DIAGNOSIS — E876 Hypokalemia: Secondary | ICD-10-CM

## 2023-10-10 DIAGNOSIS — K921 Melena: Secondary | ICD-10-CM | POA: Diagnosis not present

## 2023-10-10 DIAGNOSIS — D500 Iron deficiency anemia secondary to blood loss (chronic): Secondary | ICD-10-CM | POA: Diagnosis not present

## 2023-10-10 DIAGNOSIS — Z5112 Encounter for antineoplastic immunotherapy: Secondary | ICD-10-CM | POA: Diagnosis not present

## 2023-10-10 DIAGNOSIS — K5900 Constipation, unspecified: Secondary | ICD-10-CM | POA: Diagnosis not present

## 2023-10-10 DIAGNOSIS — Z79899 Other long term (current) drug therapy: Secondary | ICD-10-CM | POA: Diagnosis not present

## 2023-10-10 DIAGNOSIS — K7460 Unspecified cirrhosis of liver: Secondary | ICD-10-CM | POA: Diagnosis not present

## 2023-10-10 LAB — CBC WITH DIFFERENTIAL/PLATELET
Abs Immature Granulocytes: 0.02 K/uL (ref 0.00–0.07)
Basophils Absolute: 0 K/uL (ref 0.0–0.1)
Basophils Relative: 0 %
Eosinophils Absolute: 0 K/uL (ref 0.0–0.5)
Eosinophils Relative: 2 %
HCT: 27.5 % — ABNORMAL LOW (ref 36.0–46.0)
Hemoglobin: 8.1 g/dL — ABNORMAL LOW (ref 12.0–15.0)
Immature Granulocytes: 1 %
Lymphocytes Relative: 24 %
Lymphs Abs: 0.7 K/uL (ref 0.7–4.0)
MCH: 31.2 pg (ref 26.0–34.0)
MCHC: 29.5 g/dL — ABNORMAL LOW (ref 30.0–36.0)
MCV: 105.8 fL — ABNORMAL HIGH (ref 80.0–100.0)
Monocytes Absolute: 0.3 K/uL (ref 0.1–1.0)
Monocytes Relative: 10 %
Neutro Abs: 1.8 K/uL (ref 1.7–7.7)
Neutrophils Relative %: 63 %
Platelets: 91 K/uL — ABNORMAL LOW (ref 150–400)
RBC: 2.6 MIL/uL — ABNORMAL LOW (ref 3.87–5.11)
RDW: 16.8 % — ABNORMAL HIGH (ref 11.5–15.5)
WBC: 2.8 K/uL — ABNORMAL LOW (ref 4.0–10.5)
nRBC: 0 % (ref 0.0–0.2)

## 2023-10-10 LAB — SAMPLE TO BLOOD BANK

## 2023-10-10 MED ORDER — SODIUM CHLORIDE 0.9 % IV SOLN: Freq: Once | INTRAVENOUS | Status: DC | PRN

## 2023-10-10 MED ORDER — FAMOTIDINE IN NACL 20-0.9 MG/50ML-% IV SOLN
20.0000 mg | Freq: Once | INTRAVENOUS | Status: DC | PRN
Start: 2023-10-10 — End: 2023-10-10

## 2023-10-10 MED ORDER — SODIUM CHLORIDE 0.9 % IV SOLN: Freq: Once | INTRAVENOUS | Status: AC

## 2023-10-10 MED ORDER — HEPARIN SOD (PORK) LOCK FLUSH 100 UNIT/ML IV SOLN
500.0000 [IU] | Freq: Once | INTRAVENOUS | Status: AC | PRN
  Administered 2023-10-10: 500 [IU]

## 2023-10-10 MED ORDER — METHYLPREDNISOLONE SODIUM SUCC 125 MG IJ SOLR
125.0000 mg | Freq: Once | INTRAMUSCULAR | Status: DC | PRN
Start: 2023-10-10 — End: 2023-10-10

## 2023-10-10 MED ORDER — ALBUTEROL SULFATE (2.5 MG/3ML) 0.083% IN NEBU: 2.5000 mg | INHALATION_SOLUTION | Freq: Once | RESPIRATORY_TRACT | Status: DC | PRN

## 2023-10-10 MED ORDER — CETIRIZINE HCL 10 MG PO TABS
10.0000 mg | ORAL_TABLET | Freq: Once | ORAL | Status: AC
  Administered 2023-10-10: 10 mg via ORAL
  Filled 2023-10-10: qty 1

## 2023-10-10 MED ORDER — EPINEPHRINE 0.3 MG/0.3ML IJ SOAJ: 0.3000 mg | Freq: Once | INTRAMUSCULAR | Status: DC | PRN

## 2023-10-10 MED ORDER — SODIUM CHLORIDE 0.9% FLUSH
10.0000 mL | Freq: Once | INTRAVENOUS | Status: AC | PRN
  Administered 2023-10-10: 10 mL

## 2023-10-10 MED ORDER — SODIUM CHLORIDE 0.9 % IV SOLN
500.0000 mg | Freq: Once | INTRAVENOUS | Status: AC
  Administered 2023-10-10: 500 mg via INTRAVENOUS
  Filled 2023-10-10: qty 20

## 2023-10-10 MED ORDER — DIPHENHYDRAMINE HCL 50 MG/ML IJ SOLN: 50.0000 mg | Freq: Once | INTRAMUSCULAR | Status: DC | PRN

## 2023-10-10 NOTE — Patient Instructions (Signed)

## 2023-10-10 NOTE — Progress Notes (Signed)
Patient tolerated iron infusion with no complaints voiced.  Port site site clean and dry with good blood return noted before and after infusion.  Band aid applied.  VSS with discharge and left in satisfactory condition with no s/s of distress noted.

## 2023-10-13 DIAGNOSIS — C9000 Multiple myeloma not having achieved remission: Secondary | ICD-10-CM | POA: Diagnosis not present

## 2023-10-13 DIAGNOSIS — Z79899 Other long term (current) drug therapy: Secondary | ICD-10-CM | POA: Diagnosis not present

## 2023-10-13 DIAGNOSIS — I10 Essential (primary) hypertension: Secondary | ICD-10-CM | POA: Diagnosis not present

## 2023-10-13 DIAGNOSIS — J449 Chronic obstructive pulmonary disease, unspecified: Secondary | ICD-10-CM | POA: Diagnosis not present

## 2023-10-13 DIAGNOSIS — E119 Type 2 diabetes mellitus without complications: Secondary | ICD-10-CM | POA: Diagnosis not present

## 2023-10-13 DIAGNOSIS — M5136 Other intervertebral disc degeneration, lumbar region with discogenic back pain only: Secondary | ICD-10-CM | POA: Diagnosis not present

## 2023-10-13 DIAGNOSIS — R29818 Other symptoms and signs involving the nervous system: Secondary | ICD-10-CM | POA: Diagnosis not present

## 2023-10-17 ENCOUNTER — Encounter (HOSPITAL_COMMUNITY): Payer: Self-pay

## 2023-10-17 ENCOUNTER — Ambulatory Visit (HOSPITAL_COMMUNITY)
Admission: RE | Admit: 2023-10-17 | Discharge: 2023-10-17 | Disposition: A | Discharge status: Home / Self Care | Source: Ambulatory Visit | Attending: Gastroenterology | Admitting: Gastroenterology

## 2023-10-17 DIAGNOSIS — K7469 Other cirrhosis of liver: Secondary | ICD-10-CM

## 2023-10-18 ENCOUNTER — Encounter: Payer: Self-pay | Admitting: *Deleted

## 2023-10-23 ENCOUNTER — Encounter: Payer: Self-pay | Admitting: Hematology

## 2023-10-23 ENCOUNTER — Encounter (HOSPITAL_COMMUNITY): Payer: Self-pay | Admitting: Hematology

## 2023-10-23 NOTE — Progress Notes (Signed)
 Erroneous encounter

## 2023-10-24 ENCOUNTER — Inpatient Hospital Stay

## 2023-10-24 ENCOUNTER — Inpatient Hospital Stay (HOSPITAL_BASED_OUTPATIENT_CLINIC_OR_DEPARTMENT_OTHER): Admitting: Hematology

## 2023-10-24 ENCOUNTER — Encounter: Payer: Self-pay | Admitting: Hematology

## 2023-10-24 ENCOUNTER — Other Ambulatory Visit: Payer: Self-pay | Admitting: Hematology

## 2023-10-24 ENCOUNTER — Other Ambulatory Visit: Payer: Self-pay | Admitting: *Deleted

## 2023-10-24 VITALS — BP 147/66 | HR 90 | Temp 98.7°F | Resp 20

## 2023-10-24 VITALS — BP 135/76 | HR 94 | Temp 98.0°F | Resp 18

## 2023-10-24 DIAGNOSIS — D500 Iron deficiency anemia secondary to blood loss (chronic): Secondary | ICD-10-CM

## 2023-10-24 DIAGNOSIS — C9000 Multiple myeloma not having achieved remission: Secondary | ICD-10-CM

## 2023-10-24 DIAGNOSIS — E876 Hypokalemia: Secondary | ICD-10-CM | POA: Diagnosis not present

## 2023-10-24 DIAGNOSIS — Z79899 Other long term (current) drug therapy: Secondary | ICD-10-CM | POA: Diagnosis not present

## 2023-10-24 DIAGNOSIS — K5900 Constipation, unspecified: Secondary | ICD-10-CM | POA: Diagnosis not present

## 2023-10-24 DIAGNOSIS — J449 Chronic obstructive pulmonary disease, unspecified: Secondary | ICD-10-CM | POA: Diagnosis not present

## 2023-10-24 DIAGNOSIS — K7460 Unspecified cirrhosis of liver: Secondary | ICD-10-CM | POA: Diagnosis not present

## 2023-10-24 DIAGNOSIS — Z95828 Presence of other vascular implants and grafts: Secondary | ICD-10-CM

## 2023-10-24 DIAGNOSIS — K921 Melena: Secondary | ICD-10-CM | POA: Diagnosis not present

## 2023-10-24 DIAGNOSIS — Z5112 Encounter for antineoplastic immunotherapy: Secondary | ICD-10-CM | POA: Diagnosis not present

## 2023-10-24 LAB — CBC WITH DIFFERENTIAL/PLATELET
Abs Immature Granulocytes: 0.02 K/uL (ref 0.00–0.07)
Basophils Absolute: 0 K/uL (ref 0.0–0.1)
Basophils Relative: 0 %
Eosinophils Absolute: 0 K/uL (ref 0.0–0.5)
Eosinophils Relative: 1 %
HCT: 24.2 % — ABNORMAL LOW (ref 36.0–46.0)
Hemoglobin: 7.1 g/dL — ABNORMAL LOW (ref 12.0–15.0)
Immature Granulocytes: 1 %
Lymphocytes Relative: 26 %
Lymphs Abs: 1 K/uL (ref 0.7–4.0)
MCH: 31.4 pg (ref 26.0–34.0)
MCHC: 29.3 g/dL — ABNORMAL LOW (ref 30.0–36.0)
MCV: 107.1 fL — ABNORMAL HIGH (ref 80.0–100.0)
Monocytes Absolute: 0.4 K/uL (ref 0.1–1.0)
Monocytes Relative: 10 %
Neutro Abs: 2.5 K/uL (ref 1.7–7.7)
Neutrophils Relative %: 62 %
Platelets: 92 K/uL — ABNORMAL LOW (ref 150–400)
RBC: 2.26 MIL/uL — ABNORMAL LOW (ref 3.87–5.11)
RDW: 17.7 % — ABNORMAL HIGH (ref 11.5–15.5)
WBC: 3.9 K/uL — ABNORMAL LOW (ref 4.0–10.5)
nRBC: 0 % (ref 0.0–0.2)

## 2023-10-24 LAB — COMPREHENSIVE METABOLIC PANEL WITH GFR
ALT: 12 U/L (ref 0–44)
AST: 20 U/L (ref 15–41)
Albumin: 3.1 g/dL — ABNORMAL LOW (ref 3.5–5.0)
Alkaline Phosphatase: 84 U/L (ref 38–126)
Anion gap: 9 (ref 5–15)
BUN: 14 mg/dL (ref 8–23)
CO2: 28 mmol/L (ref 22–32)
Calcium: 8.4 mg/dL — ABNORMAL LOW (ref 8.9–10.3)
Chloride: 101 mmol/L (ref 98–111)
Creatinine, Ser: 0.6 mg/dL (ref 0.44–1.00)
GFR, Estimated: >60 mL/min (ref >60)
Glucose, Bld: 205 mg/dL — ABNORMAL HIGH (ref 70–99)
Potassium: 3.6 mmol/L (ref 3.5–5.1)
Sodium: 138 mmol/L (ref 135–145)
Total Bilirubin: 0.5 mg/dL (ref 0.0–1.2)
Total Protein: 5.4 g/dL — ABNORMAL LOW (ref 6.5–8.1)

## 2023-10-24 LAB — SAMPLE TO BLOOD BANK

## 2023-10-24 LAB — PREPARE RBC (CROSSMATCH)

## 2023-10-24 LAB — MAGNESIUM: Magnesium: 2 mg/dL (ref 1.7–2.4)

## 2023-10-24 MED ORDER — DEXAMETHASONE 4 MG PO TABS
20.0000 mg | ORAL_TABLET | Freq: Once | ORAL | Status: AC
  Administered 2023-10-24: 20 mg via ORAL
  Filled 2023-10-24: qty 5

## 2023-10-24 MED ORDER — CETIRIZINE HCL 10 MG PO TABS: 10.0000 mg | ORAL_TABLET | Freq: Once | ORAL | Status: DC

## 2023-10-24 MED ORDER — HEPARIN SOD (PORK) LOCK FLUSH 100 UNIT/ML IV SOLN
250.0000 [IU] | Freq: Once | INTRAVENOUS | Status: AC | PRN
  Administered 2023-10-24: 250 [IU]

## 2023-10-24 MED ORDER — SODIUM CHLORIDE 0.9 % IV SOLN
500.0000 mg | Freq: Once | INTRAVENOUS | Status: AC
  Administered 2023-10-24: 500 mg via INTRAVENOUS
  Filled 2023-10-24: qty 25

## 2023-10-24 MED ORDER — METHYLPREDNISOLONE SODIUM SUCC 125 MG IJ SOLR: 125.0000 mg | Freq: Once | INTRAMUSCULAR | Status: DC | PRN

## 2023-10-24 MED ORDER — DIPHENHYDRAMINE HCL 50 MG/ML IJ SOLN: 50.0000 mg | Freq: Once | INTRAMUSCULAR | Status: DC | PRN

## 2023-10-24 MED ORDER — DIPHENHYDRAMINE HCL 25 MG PO CAPS
50.0000 mg | ORAL_CAPSULE | Freq: Once | ORAL | Status: AC
  Administered 2023-10-24: 50 mg via ORAL
  Filled 2023-10-24: qty 2

## 2023-10-24 MED ORDER — EPINEPHRINE 0.3 MG/0.3ML IJ SOAJ: 0.3000 mg | Freq: Once | INTRAMUSCULAR | Status: DC | PRN

## 2023-10-24 MED ORDER — DEXAMETHASONE 4 MG PO TABS
ORAL_TABLET | ORAL | 3 refills | Status: DC
Start: 2023-10-24 — End: 2023-12-28

## 2023-10-24 MED ORDER — SODIUM CHLORIDE 0.9 % IV SOLN
Freq: Once | INTRAVENOUS | Status: AC
Start: 2023-10-24 — End: 2023-10-24

## 2023-10-24 MED ORDER — SODIUM CHLORIDE 0.9% FLUSH
10.0000 mL | Freq: Once | INTRAVENOUS | Status: AC | PRN
  Administered 2023-10-24: 10 mL

## 2023-10-24 MED ORDER — HEPARIN SOD (PORK) LOCK FLUSH 100 UNIT/ML IV SOLN: 500.0000 [IU] | Freq: Once | INTRAVENOUS | Status: DC

## 2023-10-24 MED ORDER — FAMOTIDINE IN NACL 20-0.9 MG/50ML-% IV SOLN: 20.0000 mg | Freq: Once | INTRAVENOUS | Status: DC | PRN

## 2023-10-24 MED ORDER — CYANOCOBALAMIN 1000 MCG/ML IJ SOLN
1000.0000 ug | Freq: Once | INTRAMUSCULAR | Status: AC
  Administered 2023-10-24: 1000 ug via INTRAMUSCULAR
  Filled 2023-10-24: qty 1

## 2023-10-24 MED ORDER — DARATUMUMAB-HYALURONIDASE-FIHJ 1800-30000 MG-UT/15ML ~~LOC~~ SOLN
1800.0000 mg | Freq: Once | SUBCUTANEOUS | Status: AC
  Administered 2023-10-24: 1800 mg via SUBCUTANEOUS
  Filled 2023-10-24: qty 15

## 2023-10-24 MED ORDER — ALBUTEROL SULFATE (2.5 MG/3ML) 0.083% IN NEBU: 2.5000 mg | INHALATION_SOLUTION | Freq: Once | RESPIRATORY_TRACT | Status: DC | PRN

## 2023-10-24 MED ORDER — SODIUM CHLORIDE 0.9 % IV SOLN: Freq: Once | INTRAVENOUS | Status: DC | PRN

## 2023-10-24 MED ORDER — SODIUM CHLORIDE 0.9% FLUSH
10.0000 mL | INTRAVENOUS | Status: DC | PRN
  Administered 2023-10-24: 10 mL via INTRAVENOUS

## 2023-10-24 MED ORDER — ACETAMINOPHEN 325 MG PO TABS
650.0000 mg | ORAL_TABLET | Freq: Once | ORAL | Status: AC
  Administered 2023-10-24: 650 mg via ORAL
  Filled 2023-10-24: qty 2

## 2023-10-24 NOTE — Progress Notes (Signed)
 [facility] [address] [place] [address]    Clinic Day:  10/24/2023  Referring physician: Jolinda Norene HERO, DO  Patient Care Team: Jolinda Norene HERO, DO as PCP - General (Family Medicine) Alvan Dorn FALCON, MD as PCP - Cardiology (Cardiology) Darlean Ned, MD (Rehabilitation) Shellia Oh, MD (Inactive) as Consulting Physician (Pulmonary Disease) Cindie Carlin POUR, DO as Consulting Physician (Internal Medicine) Edwena Parkin, NP as Nurse Practitioner (Nurse Practitioner) Gaile Frederick, MD as Referring Physician (Gastroenterology) Rogers Hai, MD as Medical Oncologist (Medical Oncology) Celestia Joesph SQUIBB, RN as Oncology Nurse Navigator (Medical Oncology) Bertrum Rosina HERO, RN as [facility] Care Management Eartha Angelia Sieving, MD as Consulting Physician (Gastroenterology)   ASSESSMENT & PLAN:   Assessment: 1. 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  Orderly maturation of erythroid and myeloid series. - Myeloma FISH panel: t(11;14) - Cytogenetics: Failed to grow metaphases - PET scan (11/18/2021): No evidence of active myeloma or plasmacytoma.  Right upper lobe lung infection. - Skull x-rays (10/29/2021) multiple discrete round lucent/lytic lesions in the skull - Worsening M spike and free light chain ratio.  Normal creatinine and calcium .  Multifactorial anemia including blood loss and bone marrow infiltration. - Cycle 1 of DRd started on 12/14/2021 with lenalidomide  15 mg 3 weeks on/1 week off and dexamethasone  20 mg weekly - Hospitalized from 09/26/2022 through 10/03/2022 with MSSA bacteremia, port removed, cefepime started, to continue until 10/26/2022   2.  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  Quit smoking 15 years ago.  No exposure to chemicals or pesticides. - Paternal grandmother had ovarian  cancer.  Sister had ovarian cancer.   3.  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  Seen by transplant hepatology service at [facility].    Plan: 1.  Stage II IgG kappa myeloma, standard risk: - At last visit we have held Revlimid  because of thrombocytopenia. - Last myeloma labs on 09/13/2023 showed M spike improved to 0.2 g. - We have sent myeloma labs from today which are pending.  Today's CBC shows platelet count 92 and white count 3.9 with normal ANC.  Creatinine and LFTs are normal. - Will continue Darzalex  monthly at this time.  Will continue to hold Revlimid  as worsening thrombocytopenia may worsen her GI bleed.  Once her GI bleeding is resolved, she may be started back on Revlimid  at a maintenance dose if her myeloma labs continue to improve.   2.  Iron  deficiency anemia from chronic GI bleed: - She continues to have black tarry stools.  Her last small bowel enteroscopy was in [place] in 2023, status post APC of angiodysplastic lesion. - She was evaluated by Dr. Eartha.  She will see pulmonology and cardiology in preparation for push enteroscopy.  She could not complete MRI of the liver as she cannot hold her breath. - Today her hemoglobin is 7.1.  We will give her 1 unit of blood transfusion.  She will continue Venofer  500 mg every 2 weeks.   3.  Infection prophylaxis: - Continue acyclovir  400 mg twice daily.  Aspirin  on hold due to bleeding.   4.  Cirrhosis: - Continue Bumex  2 mg daily.  Continue metolazone  if needed.   5.  Hypokalemia: - Continue K-Dur 20 milliequivalents 2 tablets twice daily.  Potassium is 3.6.    Orders Placed This  Encounter  Procedures   Protein electrophoresis, serum   Kappa/lambda light chains      I,Helena R Teague,acting as a scribe for Alean Stands, MD.,have documented all relevant documentation on the behalf of Alean Stands, MD,as directed by  Alean Stands, MD while in the  presence of Alean Stands, MD.  I, Alean Stands MD, have reviewed the above documentation for accuracy and completeness, and I agree with the above.       Alean Stands, MD   7/29/20253:23 PM  CHIEF COMPLAINT:   Diagnosis: stage II standard risk IgG kappa plasma cell myeloma    Cancer Staging  Multiple myeloma (HCC) Staging form: Plasma Cell Myeloma and Plasma Cell Disorders, AJCC 8th Edition - Clinical stage from 11/30/2021: RISS Stage II (Beta-2 -microglobulin (mg/L): 2.8, Albumin (g/dL): 3, ISS: Stage II, High-risk cytogenetics: Absent, LDH: Normal) - Signed by Stands Alean, MD on 11/30/2021    Prior Therapy: none  Current Therapy:  Daratumumab , lenalidomide  and dexamethasone     HISTORY OF PRESENT ILLNESS:   Oncology History  Multiple myeloma (HCC)  11/30/2021 Initial Diagnosis   Multiple myeloma (HCC)   11/30/2021 Cancer Staging   Staging form: Plasma Cell Myeloma and Plasma Cell Disorders, AJCC 8th Edition - Clinical stage from 11/30/2021: RISS Stage II (Beta-2 -microglobulin (mg/L): 2.8, Albumin (g/dL): 3, ISS: Stage II, High-risk cytogenetics: Absent, LDH: Normal) - Signed by Stands Alean, MD on 11/30/2021 Histopathologic type: Multiple myeloma Stage prefix: Initial diagnosis Beta 2 microglobulin range (mg/L): Less than 3.5 Albumin range (g/dL): Less than 3.5 Cytogenetics: t(11;14) translocation Serum calcium  level: Normal   12/14/2021 -  Chemotherapy   Patient is on Treatment Plan : MYELOMA  Daratumumab  SQ + Lenalidomide  + Dexamethasone  (DaraRd) q28d        INTERVAL HISTORY:   Savannah Burns  is a 73 y.o. female presenting to clinic today for follow up of stage II standard risk IgG kappa plasma cell myeloma. She was last seen by me on 09/26/2023.  Today, she states that she is doing well overall. Her appetite level is at 80%. Her energy level is at 0%. Savannah Burns  states she is tired today. She was seen by GI on 10/05/2023, who recommended an MRI of  the liver. She was not able to complete imaging. Savannah Burns  was also referred to pulmonology and cardiology by her gastroenterologist and has a consult scheduled with cardiology.   She reports black tar-like stools with every BM and denies any blood per rectum. Savannah Burns  is tolerating darzalex  well and continues to hold Revlimid . She is not taking aspirin . She is taking acyclovir , metolazone , and potassium as prescribed.   PAST MEDICAL HISTORY:   Past Medical History: Past Medical History:  Diagnosis Date   Adrenal adenoma, left    Stable   Anxiety    Arthritis    bilateral hands   COPD (chronic obstructive pulmonary disease) (HCC)    Depression    Diabetes mellitus, type 2 (HCC) 08/12/2008   Qualifier: Diagnosis of  By: Jenetta MD, Talia     Dyspnea    Esophageal varices (HCC)    Grade II diastolic dysfunction    History of kidney stones    Hyperlipidemia    Hypertension    Lower back pain    Lower GI bleed 03/19/2020   Panic attacks    Pneumonia    currently taking antibiotic and prednisone  for early stages of pneumonia   Pulmonary nodules    bilateral   Skin cancer    face    Surgical History: Past  Surgical History:  Procedure Laterality Date   BIOPSY  04/07/2020   Procedure: BIOPSY;  Surgeon: Cindie Carlin POUR, DO;  [facility]: [facility];  Service: Endoscopy;;   Breast Cystectomy  Right    CESAREAN SECTION     COLONOSCOPY WITH PROPOFOL  N/A 01/25/2020   Dr. Cindie: Nonbleeding internal hemorrhoids, diverticulosis, 5 mm polyp removed from the ascending colon, 10 mm polyp removed from the sigmoid colon, 30 mm polyp (tubulovillous adenoma with no high-grade dysplasia) removed from the transverse colon via piecemeal status post tattoo.  Other polyps were tubular adenomas.  3 month surveillance colonoscopy recommended.   COLONOSCOPY WITH PROPOFOL  N/A 04/07/2020   Procedure: COLONOSCOPY WITH PROPOFOL ;  Surgeon: Cindie Carlin POUR, DO;  [facility]: [facility];  Service: Endoscopy;   Laterality: N/A;  3:00pm, pt knows new time per office   CYSTOSCOPY/URETEROSCOPY/HOLMIUM LASER/STENT PLACEMENT Bilateral 03/01/2019   Procedure: CYSTOSCOPY/RETROGRADEURETEROSCOPY/HOLMIUM LASER/STENT PLACEMENT;  Surgeon: Devere Lonni Righter, MD;  Location: [facility];  Service: Urology;  Laterality: Bilateral;  ONLY NEEDS 60 MIN   ESOPHAGOGASTRODUODENOSCOPY (EGD) WITH PROPOFOL  N/A 01/25/2020   Dr. Cindie: 4 columns grade 1 esophageal varices   ESOPHAGOGASTRODUODENOSCOPY (EGD) WITH PROPOFOL  N/A 05/18/2020   Procedure: ESOPHAGOGASTRODUODENOSCOPY (EGD) WITH PROPOFOL ;  Surgeon: Cindie Carlin POUR, DO;  [facility];  Service: Endoscopy;  Laterality: N/A;   ESOPHAGOGASTRODUODENOSCOPY (EGD) WITH PROPOFOL  N/A 07/28/2020   Procedure: ESOPHAGOGASTRODUODENOSCOPY (EGD) WITH PROPOFOL ;  Surgeon: Cindie Carlin POUR, DO;  [facility];  Service: Endoscopy;  Laterality: N/A;  am or early PM due to givens capsule placement   GIVENS CAPSULE STUDY N/A 05/18/2020   Procedure: GIVENS CAPSULE STUDY;  Surgeon: Eartha Angelia Sieving, MD;  [facility]: [facility];  Service: Gastroenterology;  Laterality: N/A;   GIVENS CAPSULE STUDY N/A 07/28/2020   Procedure: GIVENS CAPSULE STUDY;  Surgeon: Cindie Carlin POUR, DO;  [facility];  Service: Endoscopy;  Laterality: N/A;   IR IMAGING GUIDED PORT INSERTION  12/02/2021   IRRIGATION AND DEBRIDEMENT ABSCESS Right 09/28/2022   Procedure: IRRIGATION AND DEBRIDEMENT ABSCESS;  Surgeon: Evonnie Dorothyann LABOR, DO;  Location: [facility];  Service: General;  Laterality: Right;   POLYPECTOMY  01/25/2020   Procedure: POLYPECTOMY;  Surgeon: Cindie Carlin POUR, DO;  [facility];  Service: Endoscopy;;   POLYPECTOMY  04/07/2020   Procedure: POLYPECTOMY INTESTINAL;  Surgeon: Cindie Carlin POUR, DO;  [facility];  Service: Endoscopy;;   PORT-A-CATH REMOVAL Right 09/28/2022   Procedure: MINOR REMOVAL PORT-A-CATH;  Surgeon: Evonnie Dorothyann LABOR, DO;   Location: [facility];  Service: General;  Laterality: Right;   PORTACATH PLACEMENT Right 04/12/2023   Procedure: INSERTION PORT-A-CATH, RIJ;  Surgeon: Evonnie Dorothyann LABOR, DO;  Location: [facility];  Service: General;  Laterality: Right;   SKIN CANCER EXCISION     Face   SPINE SURGERY     SUBMUCOSAL TATTOO INJECTION  01/25/2020   Procedure: SUBMUCOSAL TATTOO INJECTION;  Surgeon: Cindie Carlin POUR, DO;  [facility];  Service: Endoscopy;;    Social History: Social History   Socioeconomic History   Marital status: Widowed    Spouse name: Not on file   Number of children: 2   Years of education: 14   Highest education level: Not on file  Occupational History   Occupation: Retired   Tobacco Use   Smoking status: Former    Current packs/day: 0.00    Average packs/day: 1.5 packs/day for 40.0 years (60.0 ttl pk-yrs)    Types: Cigarettes  Start date: 04/29/1975    Quit date: 04/29/2015    Years since quitting: 8.4   Smokeless tobacco: Never   Tobacco comments:    Quit smoking 04/2015- Previous 1.5 ppd smoker  Vaping Use   Vaping status: Never Used  Substance and Sexual Activity   Alcohol  use: No    Alcohol /week: 0.0 standard drinks of alcohol    Drug use: No   Sexual activity: Not Currently    Birth control/protection: Post-menopausal  Other Topics Concern   Not on file  Social History Narrative   Her 36 year old granddaughter lives with her - one daughter lives nearby, but she doesn't have a good relationship with her. Has a great relationship with other daughter who lives 1.5 hrs away - talks to her daily on the phone.   Social Drivers of Corporate investment banker Strain: Low Risk  (06/12/2023)   Overall Financial Resource Strain (CARDIA)    Difficulty of Paying Living Expenses: Not very hard  Food Insecurity: No Food Insecurity (09/20/2023)   Hunger Vital Sign    Worried About Running Out of Food in the Last Year: Never true    Ran Out of Food in the Last Year: Never  true  Transportation Needs: No Transportation Needs (09/20/2023)   PRAPARE - Administrator, Civil Service (Medical): No    Lack of Transportation (Non-Medical): No  Physical Activity: Inactive (01/27/2023)   Exercise Vital Sign    Days of Exercise per Week: 0 days    Minutes of Exercise per Session: 0 min  Stress: No Stress Concern Present (01/27/2023)   [employer] of Occupational Health - Occupational Stress Questionnaire    Feeling of Stress : Not at all  Social Connections: Socially Isolated (06/15/2023)   Social Connection and Isolation Panel    Frequency of Communication with Friends and Family: More than three times a week    Frequency of Social Gatherings with Friends and Family: More than three times a week    Attends Religious Services: Never    Database administrator or Organizations: No    Attends Banker Meetings: Never    Marital Status: Widowed  Intimate Partner Violence: Not At Risk (09/20/2023)   Humiliation, Afraid, Rape, and Kick questionnaire    Fear of Current or Ex-Partner: No    Emotionally Abused: No    Physically Abused: No    Sexually Abused: No    Family History: Family History  Problem Relation Age of Onset   Diabetes Father    Heart disease Father 24       MI   Hypertension Father    Anemia Mother        Transfusion dependent   COPD Sister    Cancer Paternal Grandmother 51       Pancreatic    Current Medications:  Current Outpatient Medications:    acetaminophen  (TYLENOL ) 325 MG tablet, Take 2 tablets (650 mg total) by mouth every 6 (six) hours as needed for mild pain (pain score 1-3) (or Fever >/= 101)., Disp: , Rfl:    albuterol  (PROVENTIL ) (2.5 MG/3ML) 0.083% nebulizer solution, Take 3 mLs (2.5 mg total) by nebulization every 4 (four) hours as needed for wheezing or shortness of breath., Disp: 75 mL, Rfl: 1   albuterol  (VENTOLIN  HFA) 108 (90 Base) MCG/ACT inhaler, Inhale 2 puffs into the lungs every 6 (six) hours  as needed for wheezing or shortness of breath., Disp: 8 g, Rfl: 3  bumetanide  (BUMEX ) 1 MG tablet, Take 2 tablets (2 mg total) by mouth daily., Disp: 60 tablet, Rfl: 2   carvedilol  (COREG ) 6.25 MG tablet, Take 1 tablet (6.25 mg total) by mouth 2 (two) times daily with a meal., Disp: 60 tablet, Rfl: 5   desvenlafaxine  (PRISTIQ ) 100 MG 24 hr tablet, Take 100 mg by mouth in the morning., Disp: , Rfl:    esomeprazole  (NEXIUM ) 40 MG capsule, Take 1 capsule (40 mg total) by mouth 2 (two) times daily before a meal., Disp: 180 capsule, Rfl: 3   ferrous sulfate  325 (65 FE) MG tablet, Take 1 tablet (325 mg total) by mouth daily with breakfast., Disp: 30 tablet, Rfl: 3   fluticasone -salmeterol (ADVAIR) 100-50 MCG/ACT AEPB, Inhale 1 puff into the lungs 2 (two) times daily., Disp: 60 each, Rfl: 5   furosemide  (LASIX ) 40 MG tablet, Take 1 tablet (40 mg total) by mouth daily., Disp: 30 tablet, Rfl: 0   lenalidomide  (REVLIMID ) 15 MG capsule, TAKE 1 CAPSULE BY MOUTH EVERY DAY FOR 21 DAYS ON THEN 7 DAYS OFF, Disp: 21 capsule, Rfl: 0   lidocaine -prilocaine  (EMLA ) cream, Apply 1 Application topically as needed. Apply a small amount to port a cath site and cover with plastic wrap 1 hour prior to infusion appointments, Disp: 30 g, Rfl: 3   lovastatin  (MEVACOR ) 20 MG tablet, Take 1 tablet (20 mg total) by mouth at bedtime., Disp: 90 tablet, Rfl: 3   methocarbamol  (ROBAXIN ) 750 MG tablet, Take 1 tablet (750 mg total) by mouth 3 (three) times daily., Disp: 90 tablet, Rfl: 1   metolazone  (ZAROXOLYN ) 2.5 MG tablet, Take 1 tablet (2.5 mg total) by mouth daily as needed., Disp: 30 tablet, Rfl: 0   nystatin  (MYCOSTATIN /NYSTOP ) powder, Apply topically 2 (two) times daily., Disp: 15 g, Rfl: 1   oxyCODONE  (OXY IR/ROXICODONE ) 5 MG immediate release tablet, Take 1 tablet (5 mg total) by mouth every 8 (eight) hours as needed for severe pain (pain score 7-10)., Disp: 10 tablet, Rfl: 0   oxyCODONE -acetaminophen  (PERCOCET) 10-325 MG  tablet, Take 1 tablet by mouth 4 (four) times daily as needed., Disp: , Rfl:    OXYGEN , Inhale 3 L into the lungs continuous., Disp: , Rfl:    OZEMPIC , 0.25 OR 0.5 MG/DOSE, 2 MG/3ML SOPN, Inject 0.25 mg into the skin every 7 (seven) days., Disp: 9 mL, Rfl: 3   potassium chloride  SA (KLOR-CON  M) 20 MEQ tablet, Take 1 tablet (20 mEq total) by mouth 2 (two) times daily., Disp: 60 tablet, Rfl: 3   dexamethasone  (DECADRON ) 4 MG tablet, Take 5 tablets once every 4 weeks prior to darzalex , Disp: 20 tablet, Rfl: 3 No current facility-administered medications for this visit.  Facility-Administered Medications Ordered in Other Visits:    0.9 %  sodium chloride  infusion, , Intravenous, Once PRN, Natalyia Innes, MD   albuterol  (PROVENTIL ) (2.5 MG/3ML) 0.083% nebulizer solution 2.5 mg, 2.5 mg, Nebulization, Once PRN, Prerna Harold, MD   Allergies: Allergies  Allergen Reactions   Keflex  [Cephalexin ] Nausea And Vomiting    REVIEW OF SYSTEMS:   Review of Systems  Constitutional:  Negative for chills, fatigue and fever.  HENT:   Negative for lump/mass, mouth sores, nosebleeds, sore throat and trouble swallowing.   Eyes:  Negative for eye problems.  Respiratory:  Positive for shortness of breath. Negative for cough.   Cardiovascular:  Negative for chest pain, leg swelling and palpitations.  Gastrointestinal:  Negative for abdominal pain, constipation, diarrhea, nausea and vomiting.  Genitourinary:  Negative for bladder incontinence, difficulty urinating, dysuria, frequency, hematuria and nocturia.   Musculoskeletal:  Positive for back pain (4/10 severity). Negative for arthralgias, flank pain, myalgias and neck pain.  Skin:  Negative for itching and rash.  Neurological:  Negative for dizziness, headaches and numbness.       +tingling in feet  Hematological:  Does not bruise/bleed easily.  Psychiatric/Behavioral:  Positive for depression. Negative for sleep disturbance and suicidal ideas.  The patient is nervous/anxious.   All other systems reviewed and are negative.    VITALS:   There were no vitals taken for this visit.  Wt Readings from Last 3 Encounters:  10/05/23 (!) 302 lb 4.8 oz (137.1 kg)  09/26/23 296 lb (134.3 kg)  08/29/23 298 lb 1 oz (135.2 kg)    There is no height or weight on file to calculate BMI.  Performance status (ECOG): 1 - Symptomatic but completely ambulatory  PHYSICAL EXAM:   Physical Exam Vitals and nursing note reviewed. Exam conducted with a chaperone present.  Constitutional:      Appearance: Normal appearance.  Cardiovascular:     Rate and Rhythm: Normal rate and regular rhythm.     Pulses: Normal pulses.     Heart sounds: Normal heart sounds.  Pulmonary:     Effort: Pulmonary effort is normal.     Breath sounds: Normal breath sounds.  Abdominal:     Palpations: Abdomen is soft. There is no hepatomegaly, splenomegaly or mass.     Tenderness: There is no abdominal tenderness.  Musculoskeletal:     Right lower leg: No edema.     Left lower leg: No edema.  Lymphadenopathy:     Cervical: No cervical adenopathy.     Right cervical: No superficial, deep or posterior cervical adenopathy.    Left cervical: No superficial, deep or posterior cervical adenopathy.     Upper Body:     Right upper body: No supraclavicular or axillary adenopathy.     Left upper body: No supraclavicular or axillary adenopathy.  Neurological:     General: No focal deficit present.     Mental Status: She is alert and oriented to person, place, and time.  Psychiatric:        Mood and Affect: Mood normal.        Behavior: Behavior normal.     LABS:   CBC     Component Value Date/Time   WBC 3.9 (L) 10/24/2023 0815   RBC 2.26 (L) 10/24/2023 0815   HGB 7.1 (L) 10/24/2023 0815   HGB 12.1 05/15/2023 0928   HCT 24.2 (L) 10/24/2023 0815   HCT 36.7 05/15/2023 0928   PLT 92 (L) 10/24/2023 0815   PLT 109 (L) 05/15/2023 0928   MCV 107.1 (H) 10/24/2023 0815    MCV 101 (H) 05/15/2023 0928   MCH 31.4 10/24/2023 0815   MCHC 29.3 (L) 10/24/2023 0815   RDW 17.7 (H) 10/24/2023 0815   RDW 13.1 05/15/2023 0928   LYMPHSABS 1.0 10/24/2023 0815   LYMPHSABS 1.8 06/24/2020 1529   MONOABS 0.4 10/24/2023 0815   EOSABS 0.0 10/24/2023 0815   EOSABS 0.1 06/24/2020 1529   BASOSABS 0.0 10/24/2023 0815   BASOSABS 0.0 06/24/2020 1529    CMP      Component Value Date/Time   NA 138 10/24/2023 0815   NA 146 (H) 05/15/2023 0928   K 3.6 10/24/2023 0815   CL 101 10/24/2023 0815   CO2 28 10/24/2023 0815   GLUCOSE 205 (H) 10/24/2023  0815   BUN 14 10/24/2023 0815   BUN 11 05/15/2023 0928   CREATININE 0.60 10/24/2023 0815   CREATININE 0.71 07/29/2015 1110   CALCIUM  8.4 (L) 10/24/2023 0815   CALCIUM  8.5 (L) 02/09/2023 0859   PROT 5.4 (L) 10/24/2023 0815   PROT 7.1 10/29/2019 1644   ALBUMIN 3.1 (L) 10/24/2023 0815   ALBUMIN 3.2 (L) 10/29/2019 1644   AST 20 10/24/2023 0815   ALT 12 10/24/2023 0815   ALKPHOS 84 10/24/2023 0815   BILITOT 0.5 10/24/2023 0815   BILITOT 0.2 10/29/2019 1644   GFRNONAA >60 10/24/2023 0815   GFRNONAA 102 10/19/2022 0920   GFRNONAA >89 07/29/2015 1110   GFRAA 96 02/07/2020 1415   GFRAA >89 07/29/2015 1110     No results found for: CEA1, CEA / No results found for: CEA1, CEA No results found for: PSA1 No results found for: CAN199 No results found for: RJW874  Lab Results  Component Value Date   TOTALPROTELP 4.9 (L) 09/13/2023   ALBUMINELP 3.0 09/13/2023   A1GS 0.2 09/13/2023   A2GS 0.6 09/13/2023   BETS 0.8 09/13/2023   GAMS 0.4 09/13/2023   MSPIKE 0.2 (H) 09/13/2023   SPEI Comment 09/13/2023   Lab Results  Component Value Date   TIBC 294 04/13/2023   TIBC 285 02/09/2023   TIBC 323 01/12/2023   FERRITIN 858 (H) 04/13/2023   FERRITIN 578 (H) 02/09/2023   FERRITIN 441 (H) 01/12/2023   IRONPCTSAT 32 (H) 04/13/2023   IRONPCTSAT 136 (H) 02/09/2023   IRONPCTSAT 28 01/12/2023   Lab Results   Component Value Date   LDH 92 (L) 10/22/2021   LDH 101 06/25/2021   LDH 103 03/24/2021     STUDIES:   No results found.

## 2023-10-24 NOTE — Progress Notes (Signed)
 Patient presents today for Darzalex  Faspro/Venofer  500 mg infusion, and B12 injection. Patient is in satisfactory condition with no new complaints voiced.  Vital signs are stable.  Labs reviewed by Dr. Rogers during the office visit and all other labs are within treatment parameters. Patient's hemoglobin noted to be 7.1 today.  We will proceed with treatment per MD orders.   Patient will return to the clinic for 1 unit of blood Friday. Patient made aware and verbalized understanding.   Treatment given today per MD orders. Tolerated infusion without adverse affects. Vital signs stable. No complaints at this time. Discharged from clinic via wheelchair in stable condition. Alert and oriented x 3. F/U with [facility] as scheduled.

## 2023-10-24 NOTE — Progress Notes (Signed)
 Patient is not taking Revlimid  as prescribed - on hold per MD.   Patient has been examined by Dr. Rogers. Vital signs and labs have been reviewed by MD - ANC, Creatinine, LFTs, hemoglobin (7.1), and platelets (91) are within treatment parameters per M.D. - pt may proceed with treatment. Transfuse 1 unit PRBC per MD. Primary RN and pharmacy notified.

## 2023-10-24 NOTE — Patient Instructions (Signed)
 [facility] [place] - A DEPT OF [place]  Discharge Instructions: Thank you for choosing [facility] to provide your oncology and hematology care.  If you have a lab appointment with the Cancer Center - please note that after April 8th, 2024, all labs will be drawn in the cancer center.  You do not have to check in or register with the main entrance as you have in the past but will complete your check-in in the cancer center.  Wear comfortable clothing and clothing appropriate for easy access to any Portacath or PICC line.   We strive to give you quality time with your provider. You may need to reschedule your appointment if you arrive late (15 or more minutes).  Arriving late affects you and other patients whose appointments are after yours.  Also, if you miss three or more appointments without notifying the office, you may be dismissed from the clinic at the provider's discretion.      For prescription refill requests, have your pharmacy contact our office and allow 72 hours for refills to be completed.    Today you received the following chemotherapy and/or immunotherapy agents Darzalex  Faspro/ Venofer  500 mg/ and B12 injection.   To help prevent nausea and vomiting after your treatment, we encourage you to take your nausea medication as directed.  BELOW ARE SYMPTOMS THAT SHOULD BE REPORTED IMMEDIATELY: *FEVER GREATER THAN 100.4 F (38 C) OR HIGHER *CHILLS OR SWEATING *NAUSEA AND VOMITING THAT IS NOT CONTROLLED WITH YOUR NAUSEA MEDICATION *UNUSUAL SHORTNESS OF BREATH *UNUSUAL BRUISING OR BLEEDING *URINARY PROBLEMS (pain or burning when urinating, or frequent urination) *BOWEL PROBLEMS (unusual diarrhea, constipation, pain near the anus) TENDERNESS IN MOUTH AND THROAT WITH OR WITHOUT PRESENCE OF ULCERS (sore throat, sores in mouth, or a toothache) UNUSUAL RASH, SWELLING OR PAIN  UNUSUAL VAGINAL DISCHARGE OR ITCHING   Items with * indicate a  potential emergency and should be followed up as soon as possible or go to the Emergency Department if any problems should occur.  Please show the CHEMOTHERAPY ALERT CARD or IMMUNOTHERAPY ALERT CARD at check-in to the Emergency Department and triage nurse.  Should you have questions after your visit or need to cancel or reschedule your appointment, please contact [facility] [place] - A DEPT OF [facility] [phone]  and follow the prompts.  Office hours are 8:00 a.m. to 4:30 p.m. Monday - Friday. Please note that voicemails left after 4:00 p.m. may not be returned until the following business day.  We are closed weekends and major holidays. You have access to a nurse at all times for urgent questions. Please call the main number to the clinic [phone] and follow the prompts.  For any non-urgent questions, you may also contact your provider using MyChart. We now offer e-Visits for anyone 99 and older to request care online for non-urgent symptoms. For details visit [email or web address].   Also download the MyChart app! Go to the app store, search MyChart, open the app, select [place], and log in with your MyChart username and password.

## 2023-10-24 NOTE — Progress Notes (Signed)
 Dex 20mg  po added to tx today, as pt is out of med at home. MD/RN to send new rx to pt's pharmacy.  Nyan Dufresne, PharmD, MBA

## 2023-10-24 NOTE — Progress Notes (Signed)
 Patients port flushed without difficulty.  Good blood return noted with no bruising or swelling noted at site. BBS drawn, blue blood bracelet placed. Patient remains accessed for treatment.

## 2023-10-24 NOTE — Patient Instructions (Signed)

## 2023-10-25 LAB — KAPPA/LAMBDA LIGHT CHAINS
Kappa free light chain: 11.1 mg/L (ref 3.3–19.4)
Kappa, lambda light chain ratio: 2.71 — ABNORMAL HIGH (ref 0.26–1.65)
Lambda free light chains: 4.1 mg/L — ABNORMAL LOW (ref 5.7–26.3)

## 2023-10-26 ENCOUNTER — Ambulatory Visit (INDEPENDENT_AMBULATORY_CARE_PROVIDER_SITE_OTHER): Admitting: Gastroenterology

## 2023-10-27 ENCOUNTER — Inpatient Hospital Stay: Attending: Hematology

## 2023-10-27 ENCOUNTER — Other Ambulatory Visit: Payer: Self-pay

## 2023-10-27 DIAGNOSIS — Z7961 Long term (current) use of immunomodulator: Secondary | ICD-10-CM | POA: Diagnosis not present

## 2023-10-27 DIAGNOSIS — D696 Thrombocytopenia, unspecified: Secondary | ICD-10-CM | POA: Diagnosis not present

## 2023-10-27 DIAGNOSIS — D509 Iron deficiency anemia, unspecified: Secondary | ICD-10-CM | POA: Insufficient documentation

## 2023-10-27 DIAGNOSIS — Z5112 Encounter for antineoplastic immunotherapy: Secondary | ICD-10-CM | POA: Insufficient documentation

## 2023-10-27 DIAGNOSIS — C9000 Multiple myeloma not having achieved remission: Secondary | ICD-10-CM | POA: Diagnosis not present

## 2023-10-27 DIAGNOSIS — Z79899 Other long term (current) drug therapy: Secondary | ICD-10-CM | POA: Diagnosis not present

## 2023-10-27 DIAGNOSIS — E538 Deficiency of other specified B group vitamins: Secondary | ICD-10-CM | POA: Insufficient documentation

## 2023-10-27 DIAGNOSIS — R5383 Other fatigue: Secondary | ICD-10-CM | POA: Diagnosis not present

## 2023-10-27 LAB — PROTEIN ELECTROPHORESIS, SERUM
A/G Ratio: 1.5 (ref 0.7–1.7)
Albumin ELP: 2.9 g/dL (ref 2.9–4.4)
Alpha-1-Globulin: 0.3 g/dL (ref 0.0–0.4)
Alpha-2-Globulin: 0.6 g/dL (ref 0.4–1.0)
Beta Globulin: 0.7 g/dL (ref 0.7–1.3)
Gamma Globulin: 0.4 g/dL (ref 0.4–1.8)
Globulin, Total: 2 g/dL — ABNORMAL LOW (ref 2.2–3.9)
M-Spike, %: 0.1 g/dL — ABNORMAL HIGH
Total Protein ELP: 4.9 g/dL — ABNORMAL LOW (ref 6.0–8.5)

## 2023-10-27 MED ORDER — ACETAMINOPHEN 325 MG PO TABS
650.0000 mg | ORAL_TABLET | Freq: Once | ORAL | Status: AC
  Administered 2023-10-27: 650 mg via ORAL
  Filled 2023-10-27: qty 2

## 2023-10-27 MED ORDER — SODIUM CHLORIDE 0.9% FLUSH
10.0000 mL | INTRAVENOUS | Status: AC | PRN
  Administered 2023-10-27: 10 mL

## 2023-10-27 MED ORDER — SODIUM CHLORIDE 0.9% IV SOLUTION
250.0000 mL | INTRAVENOUS | Status: DC
  Administered 2023-10-27: 100 mL via INTRAVENOUS

## 2023-10-27 MED ORDER — DIPHENHYDRAMINE HCL 25 MG PO CAPS
25.0000 mg | ORAL_CAPSULE | Freq: Once | ORAL | Status: AC
  Administered 2023-10-27: 25 mg via ORAL
  Filled 2023-10-27: qty 1

## 2023-10-27 MED ORDER — HEPARIN SOD (PORK) LOCK FLUSH 100 UNIT/ML IV SOLN
500.0000 [IU] | Freq: Every day | INTRAVENOUS | Status: AC | PRN
  Administered 2023-10-27: 500 [IU]

## 2023-10-27 NOTE — Patient Instructions (Signed)
 [facility] [place] - A DEPT OF [place]  Discharge Instructions: Thank you for choosing [facility] to provide your oncology and hematology care.  If you have a lab appointment with the Cancer Center - please note that after April 8th, 2024, all labs will be drawn in the cancer center.  You do not have to check in or register with the main entrance as you have in the past but will complete your check-in in the cancer center.  Wear comfortable clothing and clothing appropriate for easy access to any Portacath or PICC line.   We strive to give you quality time with your provider. You may need to reschedule your appointment if you arrive late (15 or more minutes).  Arriving late affects you and other patients whose appointments are after yours.  Also, if you miss three or more appointments without notifying the office, you may be dismissed from the clinic at the provider's discretion.      For prescription refill requests, have your pharmacy contact our office and allow 72 hours for refills to be completed.    Today you received the following:  1 unit of PRBC.  Blood Transfusion, Adult A blood transfusion is a procedure in which you receive blood or a type of blood cell (blood component) through an IV. You may need a blood transfusion when you have a low blood count, which is a low number of any blood cell. This may result from a bleeding disorder, illness, injury, or surgery. The blood may come from a donor, or you may be able to have your own blood collected and stored (autologous blood donation) before a planned surgery. The blood given in a transfusion may be made up of different blood components. You may receive: Red blood cells. These carry oxygen to the cells in the body. Platelets. These help your blood to clot. Plasma. This is the liquid part of your blood. It carries proteins and other substances throughout the body. White blood cells. These help  you fight infections. If you have hemophilia or another clotting disorder, you may also receive other types of blood products. Depending on the type of blood product, this procedure may take 1-4 hours to complete. Tell a health care provider about: Any bleeding problems you have. Any previous reactions you have had during a blood transfusion. Any allergies you have. All medicines you are taking, including vitamins, herbs, eye drops, creams, and over-the-counter medicines. Any surgeries you have had. Any medical conditions you have. Whether you are pregnant or may be pregnant. What are the risks? Talk with your health care provider about risks. The most common problems include: A mild allergic reaction, such as red, swollen areas of skin (hives) and itching. Fever or chills. This may be the body's response to new blood cells received. This may occur during or up to 4 hours after the transfusion. More serious problems may include: A serious allergic reaction that causes difficulty breathing or swelling around the face and lips. Transfusion-associated circulatory overload (TACO), or too much fluid in the lungs. This may cause breathing problems. Transfusion-related acute lung injury (TRALI), which causes breathing difficulty and low oxygen in the blood. This can occur within hours of the transfusion or several days later. Iron overload. This can happen after receiving many blood transfusions over a period of time. Infection or virus being transmitted. This is rare because donated blood is carefully tested before it is given. Hemolytic transfusion reaction.  This is rare. It happens when the body's defense system (immune system)tries to attack the new blood cells. Symptoms may include fever, chills, nausea, low blood pressure, and low back or chest pain. Transfusion-associated graft-versus-host disease (TAGVHD). This is rare. It happens when donated cells attack the body's healthy tissues. What  happens before the procedure? You will have a blood test to check your blood type. This test is done to know what kind of blood your body will accept and to match it to the donor blood. If you are going to have a planned surgery, you may be able to do an autologous blood donation. This may be done in case you need to have a transfusion. You will have your temperature, blood pressure, and pulse checked before the transfusion. If you have had an allergic reaction to a transfusion in the past, you may be given medicine to help prevent a reaction. This medicine may be given to you by mouth (orally) or through an IV. What happens during the procedure?  An IV will be inserted into one of your veins. The bag of blood will be attached to your IV. The blood will then enter through your vein. Your temperature, blood pressure, and pulse will be monitored during the transfusion. This monitoring is done to detect early signs of a transfusion reaction. Tell your nurse right away if you have any of these symptoms during the transfusion: Shortness of breath or trouble breathing. Chest or back pain. Fever or chills. Itching or hives. If you have any signs or symptoms of a reaction, your transfusion will be stopped and you may be given medicine. When the transfusion is complete, your IV will be removed. Pressure may be applied to the IV site for a few minutes. A bandage (dressing)will be applied. The procedure may vary among health care providers and hospitals. What happens after the procedure? Your temperature, blood pressure, pulse, breathing rate, and blood oxygen level will be monitored until you leave the hospital or clinic. Your blood may be tested to see how you have responded to the transfusion. You may be warmed with fluids or blankets to maintain a normal body temperature. If you receive your blood transfusion in an outpatient setting, you will be told whom to contact to report any reactions. Where  to find more information Visit the American Red Cross: redcross.org Summary A blood transfusion is a procedure in which you receive blood or a type of blood cell (blood component) through an IV. The blood given in a transfusion may be made up of different blood components. You may receive red blood cells, platelets, plasma, or white blood cells depending on the condition treated. Your temperature, blood pressure, and pulse will be monitored before, during, and after the transfusion. After the transfusion, your blood may be tested to see how your body has responded. This information is not intended to replace advice given to you by your health care provider. Make sure you discuss any questions you have with your health care provider. Document Revised: 06/11/2021 Document Reviewed: 06/11/2021 Elsevier Patient Education  2024 Elsevier Inc.    To help prevent nausea and vomiting after your treatment, we encourage you to take your nausea medication as directed.  BELOW ARE SYMPTOMS THAT SHOULD BE REPORTED IMMEDIATELY: *FEVER GREATER THAN 100.4 F (38 C) OR HIGHER *CHILLS OR SWEATING *NAUSEA AND VOMITING THAT IS NOT CONTROLLED WITH YOUR NAUSEA MEDICATION *UNUSUAL SHORTNESS OF BREATH *UNUSUAL BRUISING OR BLEEDING *URINARY PROBLEMS (pain or burning when urinating, or  frequent urination) *BOWEL PROBLEMS (unusual diarrhea, constipation, pain near the anus) TENDERNESS IN MOUTH AND THROAT WITH OR WITHOUT PRESENCE OF ULCERS (sore throat, sores in mouth, or a toothache) UNUSUAL RASH, SWELLING OR PAIN  UNUSUAL VAGINAL DISCHARGE OR ITCHING   Items with * indicate a potential emergency and should be followed up as soon as possible or go to the Emergency Department if any problems should occur.  Please show the CHEMOTHERAPY ALERT CARD or IMMUNOTHERAPY ALERT CARD at check-in to the Emergency Department and triage nurse.  Should you have questions after your visit or need to cancel or reschedule your  appointment, please contact [facility] [place] - A DEPT OF [facility] [phone]  and follow the prompts.  Office hours are 8:00 a.m. to 4:30 p.m. Monday - Friday. Please note that voicemails left after 4:00 p.m. may not be returned until the following business day.  We are closed weekends and major holidays. You have access to a nurse at all times for urgent questions. Please call the main number to the clinic [phone] and follow the prompts.  For any non-urgent questions, you may also contact your provider using MyChart. We now offer e-Visits for anyone 58 and older to request care online for non-urgent symptoms. For details visit [email or web address].   Also download the MyChart app! Go to the app store, search "MyChart", open the app, select [place], and log in with your MyChart username and password.

## 2023-10-27 NOTE — Progress Notes (Signed)
   10/27/23 0900  Spiritual Encounters  Type of Visit Follow up  Care provided to: Patient  Referral source Chaplain assessment  Reason for visit Routine spiritual support  OnCall Visit No  Spiritual Framework  Presenting Themes Meaning/purpose/sources of inspiration;Caregiving needs;Impactful experiences and emotions;Community and relationships  Community/Connection Family  Patient Stress Factors Exhausted;Family relationships;Major life changes  Family Stress Factors Family relationships  Interventions  Spiritual Care Interventions Made Compassionate presence;Reflective listening;Narrative/life review;Explored values/beliefs/practices/strengths;Encouragement  Intervention Outcomes  Outcomes Awareness of support  Spiritual Care Plan  Spiritual Care Issues Still Outstanding Chaplain will continue to follow  Follow up plan  Monthly connection   Reason for Visit: Chaplain making scheduled follow-up with Pt I attempted to visit earlier in the week and have the opportunity to see today.  Description of Visit: Upon entering the room I found Virgina seated in the recliner already receiving her treatment.  She greeted me and I could tell that her countenance is different on this visit than the previous, and her body language suggests fatigue.  We begin a conversation, and I want to explore that tiredness that I see. She explains that her low hemoglobin causes fatigue, but she goes on to describe an emotional/mental fatigue.  She talks of seeing her GI doctor and while Julienne wants to have the surgery to address her internal blood losses, the doctor insists on heart and lung consults prior to the surgery.  Nariya  expresses exhaustion, stating that she is hesitant to go through it all again (she has had to do this previously years ago).  She spoke her mother suffering with similar issues and at some point began to refuse transfusions and "she went to sleep and never woke up." I want to explore this  more, but our conversation gets sidetracked.  Our conversation was ultimately interrupted by the need for the RN to deliver care.  I intend to return to Alyn  later this morning and explore her "exhaustion" more deeply.  Plan of Care: I will continue to follow Virgina closely, on a mothly basis.  Maude Roll, MDiv  Chaplain, [facility] [email or web address] [phone]

## 2023-10-27 NOTE — Progress Notes (Signed)
 UNMATCHED BLOOD PRODUCT NOTE  Compare the patient ID on the blood tag to the patient ID on the hospital armband and Blood Bank armband. Then confirm the unit number on the blood tag matches the unit number on the blood product.  If a discrepancy is discovered return the product to blood bank immediately.   Blood Product Type: Packed Red Blood Cells  Unit #: (Found on blood product bag, begins with W) [id number]  Product Code #: (Found on blood product bag, begins with E) [id number]   Start Time: 0920  Starting Rate: 120 ml/hr  Rate increase/decreased  (if applicable):      225 ml/hr  Rate changed time (if applicable): 0935   Stop Time: 1045  2 RN check by Leotis Nasuti, RN and Dena Daring, RN  All Other Documentation should be documented within the Blood Admin Flowsheet per policy.

## 2023-10-27 NOTE — Progress Notes (Signed)
 Patient presents today for one unit of PRBC.  Patient is in satisfactory condition with no new complaints voiced.  Vital signs are stable.  Port flushed with good blood return noted.  We will proceed with transfusion per provider orders.    Patient tolerated infusion well with no complaints voiced.  Patient left via wheelchair in stable condition.  Vital signs stable at discharge.  Follow up as scheduled.

## 2023-10-28 LAB — TYPE AND SCREEN
ABO/RH(D): A POS
Antibody Screen: POSITIVE
Unit division: 0
Unit division: 0

## 2023-10-28 LAB — BPAM RBC
Blood Product Expiration Date: 202508262359
Blood Product Expiration Date: 202508262359
Unit Type and Rh: 6200
Unit Type and Rh: 6200

## 2023-10-30 ENCOUNTER — Other Ambulatory Visit: Payer: Self-pay | Admitting: *Deleted

## 2023-11-07 ENCOUNTER — Inpatient Hospital Stay

## 2023-11-07 VITALS — BP 131/65 | HR 92 | Temp 98.2°F | Resp 20

## 2023-11-07 DIAGNOSIS — D500 Iron deficiency anemia secondary to blood loss (chronic): Secondary | ICD-10-CM

## 2023-11-07 DIAGNOSIS — Z7961 Long term (current) use of immunomodulator: Secondary | ICD-10-CM | POA: Diagnosis not present

## 2023-11-07 DIAGNOSIS — R5383 Other fatigue: Secondary | ICD-10-CM | POA: Diagnosis not present

## 2023-11-07 DIAGNOSIS — Z79899 Other long term (current) drug therapy: Secondary | ICD-10-CM | POA: Diagnosis not present

## 2023-11-07 DIAGNOSIS — E538 Deficiency of other specified B group vitamins: Secondary | ICD-10-CM | POA: Diagnosis not present

## 2023-11-07 DIAGNOSIS — D509 Iron deficiency anemia, unspecified: Secondary | ICD-10-CM | POA: Diagnosis not present

## 2023-11-07 DIAGNOSIS — C9000 Multiple myeloma not having achieved remission: Secondary | ICD-10-CM | POA: Diagnosis not present

## 2023-11-07 DIAGNOSIS — D696 Thrombocytopenia, unspecified: Secondary | ICD-10-CM | POA: Diagnosis not present

## 2023-11-07 DIAGNOSIS — Z5112 Encounter for antineoplastic immunotherapy: Secondary | ICD-10-CM | POA: Diagnosis not present

## 2023-11-07 MED ORDER — SODIUM CHLORIDE 0.9 % IV SOLN
500.0000 mg | Freq: Once | INTRAVENOUS | Status: AC
Start: 2023-11-07 — End: 2023-11-07
  Administered 2023-11-07 (×2): 500 mg via INTRAVENOUS
  Filled 2023-11-07: qty 25

## 2023-11-07 MED ORDER — CETIRIZINE HCL 10 MG PO TABS
10.0000 mg | ORAL_TABLET | Freq: Once | ORAL | Status: AC
  Administered 2023-11-07 (×2): 10 mg via ORAL
  Filled 2023-11-07: qty 1

## 2023-11-07 MED ORDER — SODIUM CHLORIDE 0.9 % IV SOLN: Freq: Once | INTRAVENOUS | Status: AC

## 2023-11-07 NOTE — Patient Instructions (Signed)

## 2023-11-07 NOTE — Progress Notes (Signed)
Patient tolerated iron infusion with no complaints voiced. Port site clean and dry with good blood return noted before and after infusion. Band aid applied. VSS with discharge and left in satisfactory condition with no s/s of distress noted.   °

## 2023-11-10 ENCOUNTER — Encounter (HOSPITAL_COMMUNITY): Payer: Self-pay | Admitting: Oncology

## 2023-11-10 ENCOUNTER — Encounter: Payer: Self-pay | Admitting: Oncology

## 2023-11-10 ENCOUNTER — Ambulatory Visit: Payer: Self-pay | Admitting: Family Medicine

## 2023-11-10 DIAGNOSIS — I10 Essential (primary) hypertension: Secondary | ICD-10-CM | POA: Diagnosis not present

## 2023-11-10 DIAGNOSIS — J449 Chronic obstructive pulmonary disease, unspecified: Secondary | ICD-10-CM | POA: Diagnosis not present

## 2023-11-10 DIAGNOSIS — M5136 Other intervertebral disc degeneration, lumbar region with discogenic back pain only: Secondary | ICD-10-CM | POA: Diagnosis not present

## 2023-11-10 DIAGNOSIS — C9000 Multiple myeloma not having achieved remission: Secondary | ICD-10-CM | POA: Diagnosis not present

## 2023-11-10 DIAGNOSIS — Z79899 Other long term (current) drug therapy: Secondary | ICD-10-CM | POA: Diagnosis not present

## 2023-11-10 DIAGNOSIS — E119 Type 2 diabetes mellitus without complications: Secondary | ICD-10-CM | POA: Diagnosis not present

## 2023-11-10 NOTE — Telephone Encounter (Signed)
 FYI Only or Action Required?: FYI only for provider.   Called Nurse Triage reporting Blood In Stools.  Symptoms began several months ago.   Triage Disposition: See PCP When Office is Open (Within 3 Days)  Patient/caregiver understands and will follow disposition?: Yes                       Copied from CRM [id number]. Topic: Clinical - Red Word Triage >> Nov 10, 2023 11:06 AM [name] wrote: [facility] that prompted transfer to Nurse Triage: bleeding in rectum, black tarry stool   ----------------------------------------------------------------------- From previous Reason for Contact - Scheduling: Patient/patient representative is calling to schedule an appointment. Refer to attachments for appointment information. Reason for Disposition  MILD rectal bleeding (e.g., more than just a few drops or streaks)  Answer Assessment - Initial Assessment Questions Pt scheduled to establish care on Mon, 8/18. Pt was told by surgeon that she has to see a pulmonologist before surgery for the ongoing rectal bleeding.   APPEARANCE of BLOOD: What color is it? Is it passed separately, on the surface of the stool, or mixed in with the stool?      Black tarry stool  ONSET: When was the blood first seen in the stools? (Days or weeks)      5-6 months ago; wasn't every time of bm but now it is  RECURRENT SYMPTOMS: Have you had blood in your stools before? If Yes, ask: When was the last time? and What happened that time?      Pt states she had surgery for this before at 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 (e.g., aspirin , clopidogrel / Plavix, coumadin, heparin ). Notes: Other strong blood thinners include: Arixtra (fondaparinux), Eliquis (apixaban), Pradaxa (dabigatran), and Xarelto (rivaroxaban).     No  OTHER  SYMPTOMS: Do you have any other symptoms?  (e.g., abdomen pain, vomiting, dizziness, fever)     Denies  Protocols used: Rectal Bleeding-A-AH

## 2023-11-10 NOTE — Telephone Encounter (Signed)
 Pt is having to see a pulmonologist first before having rectal surgery. Pt has appt 8/18

## 2023-11-12 NOTE — Progress Notes (Unsigned)
 Savannah  AYEN Burns, female    DOB: [birth year]    MRN: [id number]   Brief patient profile:  73  yowf  quit smoking around 2010 /MM (phenotype for alpha)  referred to pulmonary clinic in [facility]  11/13/2023 by Dr Angelia for colonoscopy clearance - last  one 2-4 years at [facility] went fine (wt about the same/ on rollator but 02 dep x 2021   Seen by [facility] 07/2019  Baseline wt  160 when stopped   Multiple myeloma/ tx q month   Data Reviewed: Imaging: CT chest 12/1/9-subcentimeter pulmonary nodules.  Left upper lobe cavitary nodule. CT chest 09/22/2008-stable pulmonary nodules  CT chest 08/12/2009-stable pulmonary nodules CT chest 07/03/2014-stable pulmonary nodules, stable left adrenal adenoma CT chest 06/10/2019-stable bilateral pulmonary nodules, no active lung disease. I reviewed images personally.   PFTs: Spirometry 08/21/2015 FVC 2.32 [86%], FEV1 1.86 [86%], 0.80. No obstruction on FV curv    08/14/2019 at wt 305  ERV 32%  FVC 1.95 [92%], FEV1 1.52 (75%), ratio 0.78, TLC 3.88 [84%], DLCO 12.64 [71%] Minimal obstruction, mild diffusion defect.   Cardiac: Echocardiogram 03/05/2020- LVEF 70%, mild LVH, grade 1 diastolic dysfunction with elevated left ventricular pressure.  Normal RV systolic function and size. Repeat  echo   09/27/22 Mild AS and mild LAE/RAE   Labs: CBC 05/29/2019-WBC 6.9, eos 1.3%, absolute eosinophil count 90 IgE 05/29/2019- 2325 Alpha-1 antitrypsin 05/29/2019-177, PI MM BNP 05/29/19- 58   Sleep Split-night sleep study 12/17/2015 No significant OSA, AHI 1.5.  Mild O2 desats to 88%   History of Present Illness  11/13/2023  Pulmonary/ 1st office eval/ Darlean / [place] Office @ wt [id number] varies not helping Chief Complaint  Patient presents with   Establish Care    Surgery Clearance for colon surgery   Dyspnea:  across the room requires 02 and still limited by doe despite sats > 90% per pt Cough: none  Sleep: recliner 60 degrees x years  SABA use: once a week 02:  freq not using at rest/ 3lpm at hs and walking on tanks     No obvious day to day or daytime pattern/variability or assoc excess/ purulent sputum or mucus plugs or hemoptysis or cp or chest tightness, subjective wheeze or overt sinus or hb symptoms.    Also denies any obvious fluctuation of symptoms with weather or environmental changes or other aggravating or alleviating factors except as outlined above   No unusual exposure hx or h/o childhood pna/ asthma or knowledge of premature birth.  Current Allergies, Complete Past Medical History, Past Surgical History, Family History, and Social History were reviewed in [employer] record.  ROS  The following are not active complaints unless bolded Hoarseness, sore throat, dysphagia, dental problems, itching, sneezing,  nasal congestion or discharge of excess mucus or purulent secretions, ear ache,   fever, chills, sweats, unintended wt loss or wt gain, classically pleuritic or exertional cp,  orthopnea pnd or arm/hand swelling  or leg swelling, presyncope, palpitations, abdominal pain, anorexia, nausea, vomiting, diarrhea  or change in bowel habits or change in bladder habits, change in stools or change in urine, dysuria, hematuria,  rash, arthralgias, visual complaints, headache, numbness, weakness or ataxia or problems with walking= uses rollator  or coordination,  change in mood or  memory.            Outpatient Medications Prior to Visit  Medication Sig Dispense Refill   acetaminophen  (TYLENOL ) 325 MG tablet Take 2 tablets (650 mg total) by mouth  every 6 (six) hours as needed for mild pain (pain score 1-3) (or Fever >/= 101).     albuterol  (PROVENTIL ) (2.5 MG/3ML) 0.083% nebulizer solution Take 3 mLs (2.5 mg total) by nebulization every 4 (four) hours as needed for wheezing or shortness of breath. 75 mL 1   albuterol  (VENTOLIN  HFA) 108 (90 Base) MCG/ACT inhaler Inhale 2 puffs into the lungs every 6 (six) hours as needed for  wheezing or shortness of breath. 8 g 3   bumetanide  (BUMEX ) 1 MG tablet Take 2 tablets (2 mg total) by mouth daily. 60 tablet 2   carvedilol  (COREG ) 6.25 MG tablet Take 1 tablet (6.25 mg total) by mouth 2 (two) times daily with a meal. 60 tablet 5   desvenlafaxine  (PRISTIQ ) 100 MG 24 hr tablet Take 100 mg by mouth in the morning.     dexamethasone  (DECADRON ) 4 MG tablet Take 5 tablets once every 4 weeks prior to darzalex  20 tablet 3   esomeprazole  (NEXIUM ) 40 MG capsule Take 1 capsule (40 mg total) by mouth 2 (two) times daily before a meal. 180 capsule 3   ferrous sulfate  325 (65 FE) MG tablet Take 1 tablet (325 mg total) by mouth daily with breakfast. 30 tablet 3   fluticasone -salmeterol (ADVAIR) 100-50 MCG/ACT AEPB Inhale 1 puff into the lungs 2 (two) times daily. 60 each 5   furosemide  (LASIX ) 40 MG tablet Take 1 tablet (40 mg total) by mouth daily. 30 tablet 0   lenalidomide  (REVLIMID ) 15 MG capsule TAKE 1 CAPSULE BY MOUTH EVERY DAY FOR 21 DAYS ON THEN 7 DAYS OFF 21 capsule 0   lidocaine -prilocaine  (EMLA ) cream Apply 1 Application topically as needed. Apply a small amount to port a cath site and cover with plastic wrap 1 hour prior to infusion appointments 30 g 3   lovastatin  (MEVACOR ) 20 MG tablet Take 1 tablet (20 mg total) by mouth at bedtime. 90 tablet 3   methocarbamol  (ROBAXIN ) 750 MG tablet Take 1 tablet (750 mg total) by mouth 3 (three) times daily. 90 tablet 1   metolazone  (ZAROXOLYN ) 2.5 MG tablet Take 1 tablet (2.5 mg total) by mouth daily as needed. 30 tablet 0   nystatin  (MYCOSTATIN /NYSTOP ) powder Apply topically 2 (two) times daily. 15 g 1   oxyCODONE  (OXY IR/ROXICODONE ) 5 MG immediate release tablet Take 1 tablet (5 mg total) by mouth every 8 (eight) hours as needed for severe pain (pain score 7-10). 10 tablet 0   oxyCODONE -acetaminophen  (PERCOCET) 10-325 MG tablet Take 1 tablet by mouth 4 (four) times daily as needed.     OXYGEN  Inhale 3 L into the lungs continuous.      OZEMPIC , 0.25 OR 0.5 MG/DOSE, 2 MG/3ML SOPN Inject 0.25 mg into the skin every 7 (seven) days. 9 mL 3   potassium chloride  SA (KLOR-CON  M) 20 MEQ tablet Take 1 tablet (20 mEq total) by mouth 2 (two) times daily. 60 tablet 3   No facility-administered medications prior to visit.    Past Medical History:  Diagnosis Date   Adrenal adenoma, left    Stable   Anxiety    Arthritis    bilateral hands   COPD (chronic obstructive pulmonary disease) (HCC)    Depression    Diabetes mellitus, type 2 (HCC) 08/12/2008   Qualifier: Diagnosis of  By: Jenetta MD, Talia     Dyspnea    Esophageal varices (HCC)    Grade II diastolic dysfunction    History of kidney stones    Hyperlipidemia  Hypertension    Lower back pain    Lower GI bleed 03/19/2020   Panic attacks    Pneumonia    currently taking antibiotic and prednisone  for early stages of pneumonia   Pulmonary nodules    bilateral   Skin cancer    face      Objective:     BP 125/60   Pulse 91   Ht 5' 2 (1.575 m)   Wt 296 lb 3.2 oz (134.4 kg)   SpO2 94% Comment: 3 L cont  BMI 54.18 kg/m   SpO2: 94 % (3 L cont) pleasant MO (by bmi) wf rollator dep   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    CXR PA and Lateral:   11/13/2023 :    I personally reviewed images and impression is as follows:     Mod kyphosis/ low lung vol s infiltrates/ effusions     Assessment     Assessment & Plan DOE (dyspnea on exertion) Quit smoking 2010/ MM phenotype for alpha One  08/14/2019 at wt 305  ERV 32%  FVC 1.95 [92%], FEV1 1.52 (75%), ratio 0.78, TLC 3.88 [84%], DLCO 12.64 [71%] -  echo   09/27/22 Mild AS and mild LAE/RAE  Her pulmonary risk for colonoscopy which  is mainly related to her wt and is likely why she chronically sleeping in recliner.  It may well be when forced to lie horiztonaly she will need some form of assisance with minimal sedation required and bipap or intubation as a last resort and do IS in meantime.   Will also need cards clearance due to AS with elevated L ht pressures  Pulmonary f/u can be prn either here or immediately p procedure.  Discussed in detail all the  indications, usual  risks and alternatives  relative to the benefits with patient who agrees to proceed with w/u as outlined.     Chronic respiratory failure with hypoxia (HCC) HC03  10/24/23   =  28   So unlikely hypercabic at baseline and can continue to titrate 02 for sats > 90% at home during the day but continue 3lpm hs for now  F/u is prn    Each maintenance medication was reviewed in detail including emphasizing most importantly the difference between maintenance and prns and under what circumstances the prns are to be triggered using an action plan format where appropriate.  Total time for H and P, chart review, counseling, reviewing hfa/ 02/ pulse ox  device(s) and generating customized AVS unique to this office visit / same day charting = 55 min with pt new to me          Patient Instructions  Please remember to go to the  x-ray department  @  [facility] for your tests - we will call you with the results when they are available     Use your incentive spirometry as much as you can (up to each hour) leading up to colonoscopy  Goal for 02 saturation is over 90% at all times   You are cleared for colonoscopy from a pulmonary perspective though there is a chance  you could wake up on a ventilator.     Ozell America, MD 11/13/2023

## 2023-11-13 ENCOUNTER — Ambulatory Visit (HOSPITAL_COMMUNITY)
Admission: RE | Admit: 2023-11-13 | Discharge: 2023-11-13 | Disposition: A | Discharge status: Home / Self Care | Source: Ambulatory Visit | Attending: Internal Medicine | Admitting: Internal Medicine

## 2023-11-13 ENCOUNTER — Ambulatory Visit: Admitting: Internal Medicine

## 2023-11-13 ENCOUNTER — Encounter: Payer: Self-pay | Admitting: Internal Medicine

## 2023-11-13 VITALS — BP 125/60 | HR 91 | Ht 62.0 in | Wt 296.2 lb

## 2023-11-13 DIAGNOSIS — J9611 Chronic respiratory failure with hypoxia: Secondary | ICD-10-CM

## 2023-11-13 DIAGNOSIS — R0609 Other forms of dyspnea: Secondary | ICD-10-CM | POA: Insufficient documentation

## 2023-11-13 DIAGNOSIS — Z87891 Personal history of nicotine dependence: Secondary | ICD-10-CM

## 2023-11-13 DIAGNOSIS — J9811 Atelectasis: Secondary | ICD-10-CM | POA: Diagnosis not present

## 2023-11-13 DIAGNOSIS — Z01811 Encounter for preprocedural respiratory examination: Secondary | ICD-10-CM

## 2023-11-13 NOTE — Assessment & Plan Note (Addendum)
 HC03  10/24/23   =  28   So unlikely hypercabic at baseline and can continue to titrate 02 for sats > 90% at home during the day but continue 3lpm hs for now  F/u is prn    Each maintenance medication was reviewed in detail including emphasizing most importantly the difference between maintenance and prns and under what circumstances the prns are to be triggered using an action plan format where appropriate.  Total time for H and P, chart review, counseling, reviewing hfa/ 02/ pulse ox  device(s) and generating customized AVS unique to this office visit / same day charting = 55 min with pt new to me

## 2023-11-13 NOTE — Patient Instructions (Signed)
 Please remember to go to the  x-ray department  @  [facility] for your tests - we will call you with the results when they are available     Use your incentive spirometry as much as you can (up to each hour) leading up to colonoscopy  Goal for 02 saturation is over 90% at all times   You are cleared for colonoscopy from a pulmonary perspective though there is a chance you could wake up on a ventilator.

## 2023-11-13 NOTE — Assessment & Plan Note (Addendum)
 Quit smoking 2010/ MM phenotype for alpha One  08/14/2019 at wt 305  ERV 32%  FVC 1.95 [92%], FEV1 1.52 (75%), ratio 0.78, TLC 3.88 [84%], DLCO 12.64 [71%] -  echo   09/27/22 Mild AS and mild LAE/RAE  Her pulmonary risk for colonoscopy which  is mainly related to her wt and is likely why she chronically sleeping in recliner.  It may well be when forced to lie horiztonaly she will need some form of assisance with minimal sedation required and bipap or intubation as a last resort and do IS in meantime.   Will also need cards clearance due to AS with elevated L ht pressures  Pulmonary f/u can be prn either here or immediately p procedure.  Discussed in detail all the  indications, usual  risks and alternatives  relative to the benefits with patient who agrees to proceed with w/u as outlined.

## 2023-11-15 ENCOUNTER — Ambulatory Visit: Payer: Self-pay | Admitting: Internal Medicine

## 2023-11-15 NOTE — Progress Notes (Signed)
 Spoke with pt regarding cxr, confirmed understanding nfn

## 2023-11-21 ENCOUNTER — Inpatient Hospital Stay

## 2023-11-21 ENCOUNTER — Encounter: Payer: Self-pay | Admitting: Oncology

## 2023-11-21 ENCOUNTER — Inpatient Hospital Stay (HOSPITAL_BASED_OUTPATIENT_CLINIC_OR_DEPARTMENT_OTHER): Admitting: Oncology

## 2023-11-21 VITALS — BP 143/69 | HR 88 | Temp 97.4°F | Resp 20

## 2023-11-21 DIAGNOSIS — C9000 Multiple myeloma not having achieved remission: Secondary | ICD-10-CM

## 2023-11-21 DIAGNOSIS — Z79899 Other long term (current) drug therapy: Secondary | ICD-10-CM | POA: Diagnosis not present

## 2023-11-21 DIAGNOSIS — D500 Iron deficiency anemia secondary to blood loss (chronic): Secondary | ICD-10-CM

## 2023-11-21 DIAGNOSIS — E538 Deficiency of other specified B group vitamins: Secondary | ICD-10-CM | POA: Insufficient documentation

## 2023-11-21 DIAGNOSIS — Z5112 Encounter for antineoplastic immunotherapy: Secondary | ICD-10-CM | POA: Diagnosis not present

## 2023-11-21 DIAGNOSIS — D696 Thrombocytopenia, unspecified: Secondary | ICD-10-CM | POA: Diagnosis not present

## 2023-11-21 DIAGNOSIS — R5383 Other fatigue: Secondary | ICD-10-CM | POA: Diagnosis not present

## 2023-11-21 DIAGNOSIS — D509 Iron deficiency anemia, unspecified: Secondary | ICD-10-CM | POA: Diagnosis not present

## 2023-11-21 DIAGNOSIS — Z7961 Long term (current) use of immunomodulator: Secondary | ICD-10-CM | POA: Diagnosis not present

## 2023-11-21 LAB — COMPREHENSIVE METABOLIC PANEL WITH GFR
ALT: 15 U/L (ref 0–44)
AST: 23 U/L (ref 15–41)
Albumin: 3.2 g/dL — ABNORMAL LOW (ref 3.5–5.0)
Alkaline Phosphatase: 86 U/L (ref 38–126)
Anion gap: 10 (ref 5–15)
BUN: 14 mg/dL (ref 8–23)
CO2: 29 mmol/L (ref 22–32)
Calcium: 8.4 mg/dL — ABNORMAL LOW (ref 8.9–10.3)
Chloride: 103 mmol/L (ref 98–111)
Creatinine, Ser: 0.59 mg/dL (ref 0.44–1.00)
GFR, Estimated: >60 mL/min (ref >60)
Glucose, Bld: 163 mg/dL — ABNORMAL HIGH (ref 70–99)
Potassium: 3.7 mmol/L (ref 3.5–5.1)
Sodium: 142 mmol/L (ref 135–145)
Total Bilirubin: 0.6 mg/dL (ref 0.0–1.2)
Total Protein: 5.6 g/dL — ABNORMAL LOW (ref 6.5–8.1)

## 2023-11-21 LAB — CBC WITH DIFFERENTIAL/PLATELET
Abs Immature Granulocytes: 0.01 K/uL (ref 0.00–0.07)
Basophils Absolute: 0 K/uL (ref 0.0–0.1)
Basophils Relative: 0 %
Eosinophils Absolute: 0 K/uL (ref 0.0–0.5)
Eosinophils Relative: 1 %
HCT: 28.4 % — ABNORMAL LOW (ref 36.0–46.0)
Hemoglobin: 8.5 g/dL — ABNORMAL LOW (ref 12.0–15.0)
Immature Granulocytes: 0 %
Lymphocytes Relative: 27 %
Lymphs Abs: 1.1 K/uL (ref 0.7–4.0)
MCH: 30.9 pg (ref 26.0–34.0)
MCHC: 29.9 g/dL — ABNORMAL LOW (ref 30.0–36.0)
MCV: 103.3 fL — ABNORMAL HIGH (ref 80.0–100.0)
Monocytes Absolute: 0.4 K/uL (ref 0.1–1.0)
Monocytes Relative: 9 %
Neutro Abs: 2.5 K/uL (ref 1.7–7.7)
Neutrophils Relative %: 63 %
Platelets: 101 K/uL — ABNORMAL LOW (ref 150–400)
RBC: 2.75 MIL/uL — ABNORMAL LOW (ref 3.87–5.11)
RDW: 16.5 % — ABNORMAL HIGH (ref 11.5–15.5)
WBC: 4 K/uL (ref 4.0–10.5)
nRBC: 0 % (ref 0.0–0.2)

## 2023-11-21 LAB — MAGNESIUM: Magnesium: 1.9 mg/dL (ref 1.7–2.4)

## 2023-11-21 LAB — SAMPLE TO BLOOD BANK

## 2023-11-21 MED ORDER — DEXAMETHASONE 4 MG PO TABS
20.0000 mg | ORAL_TABLET | Freq: Once | ORAL | Status: AC
  Administered 2023-11-21: 20 mg via ORAL
  Filled 2023-11-21: qty 5

## 2023-11-21 MED ORDER — CYANOCOBALAMIN 1000 MCG/ML IJ SOLN
1000.0000 ug | Freq: Once | INTRAMUSCULAR | Status: AC
  Administered 2023-11-21: 1000 ug via INTRAMUSCULAR
  Filled 2023-11-21: qty 1

## 2023-11-21 MED ORDER — DIPHENHYDRAMINE HCL 25 MG PO CAPS
50.0000 mg | ORAL_CAPSULE | Freq: Once | ORAL | Status: AC
  Administered 2023-11-21: 50 mg via ORAL
  Filled 2023-11-21: qty 2

## 2023-11-21 MED ORDER — SODIUM CHLORIDE 0.9 % IV SOLN: Freq: Once | INTRAVENOUS | Status: AC

## 2023-11-21 MED ORDER — DARATUMUMAB-HYALURONIDASE-FIHJ 1800-30000 MG-UT/15ML ~~LOC~~ SOLN
1800.0000 mg | Freq: Once | SUBCUTANEOUS | Status: AC
  Administered 2023-11-21: 1800 mg via SUBCUTANEOUS
  Filled 2023-11-21: qty 15

## 2023-11-21 MED ORDER — SODIUM CHLORIDE 0.9 % IV SOLN
500.0000 mg | Freq: Once | INTRAVENOUS | Status: AC
  Administered 2023-11-21: 500 mg via INTRAVENOUS
  Filled 2023-11-21: qty 20

## 2023-11-21 MED ORDER — ACYCLOVIR 400 MG PO TABS: 400.0000 mg | ORAL_TABLET | Freq: Two times a day (BID) | ORAL | 4 refills | Status: AC

## 2023-11-21 MED ORDER — ACETAMINOPHEN 325 MG PO TABS
650.0000 mg | ORAL_TABLET | Freq: Once | ORAL | Status: AC
  Administered 2023-11-21: 650 mg via ORAL
  Filled 2023-11-21: qty 2

## 2023-11-21 NOTE — Assessment & Plan Note (Addendum)
 Patient had previous vitamin B12 deficiency and is currently on parenteral supplementation  - Continue parenteral vitamin B12 supplementation for now.  Continue oral supplementation - Will repeat it with next blood draw and discontinue if high.

## 2023-11-21 NOTE — Patient Instructions (Signed)
 [facility] [place] - A DEPT OF [facility]  Discharge Instructions: Thank you for choosing [facility] to provide your oncology and hematology care.  If you have a lab appointment with the Cancer Center - please note that after April 8th, 2024, all labs will be drawn in the cancer center.  You do not have to check in or register with the main entrance as you have in the past but will complete your check-in in the cancer center.  Wear comfortable clothing and clothing appropriate for easy access to any Portacath or PICC line.   We strive to give you quality time with your provider. You may need to reschedule your appointment if you arrive late (15 or more minutes).  Arriving late affects you and other patients whose appointments are after yours.  Also, if you miss three or more appointments without notifying the office, you may be dismissed from the clinic at the provider's discretion.      For prescription refill requests, have your pharmacy contact our office and allow 72 hours for refills to be completed.    Today you received the following chemotherapy and/or immunotherapy agents Darzalex  Faspro, Venofer  500 mg, and B12 injections.      To help prevent nausea and vomiting after your treatment, we encourage you to take your nausea medication as directed.  BELOW ARE SYMPTOMS THAT SHOULD BE REPORTED IMMEDIATELY: *FEVER GREATER THAN 100.4 F (38 C) OR HIGHER *CHILLS OR SWEATING *NAUSEA AND VOMITING THAT IS NOT CONTROLLED WITH YOUR NAUSEA MEDICATION *UNUSUAL SHORTNESS OF BREATH *UNUSUAL BRUISING OR BLEEDING *URINARY PROBLEMS (pain or burning when urinating, or frequent urination) *BOWEL PROBLEMS (unusual diarrhea, constipation, pain near the anus) TENDERNESS IN MOUTH AND THROAT WITH OR WITHOUT PRESENCE OF ULCERS (sore throat, sores in mouth, or a toothache) UNUSUAL RASH, SWELLING OR PAIN  UNUSUAL VAGINAL DISCHARGE OR ITCHING   Items with * indicate a  potential emergency and should be followed up as soon as possible or go to the Emergency Department if any problems should occur.  Please show the CHEMOTHERAPY ALERT CARD or IMMUNOTHERAPY ALERT CARD at check-in to the Emergency Department and triage nurse.  Should you have questions after your visit or need to cancel or reschedule your appointment, please contact [facility] [place] - A DEPT OF [facility] [phone]  and follow the prompts.  Office hours are 8:00 a.m. to 4:30 p.m. Monday - Friday. Please note that voicemails left after 4:00 p.m. may not be returned until the following business day.  We are closed weekends and major holidays. You have access to a nurse at all times for urgent questions. Please call the main number to the clinic [phone] and follow the prompts.  For any non-urgent questions, you may also contact your provider using MyChart. We now offer e-Visits for anyone 7 and older to request care online for non-urgent symptoms. For details visit [email or web address].   Also download the MyChart app! Go to the app store, search MyChart, open the app, select , and log in with your MyChart username and password.

## 2023-11-21 NOTE — Assessment & Plan Note (Addendum)
 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 Patient had worsening thrombocytopenia with a history of GI bleeding.  Held Revlimid  on 09/26/2023  - Patient is here for daratumumab  infusion today.  Tolerating well with no complaints.  Denies shortness of breath, upper respiratory tract infections. - Discussed with patient that we can start Revlimid  once her labs are more stable.  Can start at a lower dose of 5 mg daily 3 weeks on/1 week off - Continue daratumumab  monthly at this time - Continue acyclovir  for prophylaxis - Reviewed labs today:CMP: Stable, CBC: Hemoglobin improved to 8.5, platelets improved to 101.  Recent myeloma labs from 10/24/2023: Improved M spike to 0.1 and stable free light chains  Return to clinic in 1 month with labs

## 2023-11-21 NOTE — Progress Notes (Signed)
 Patient presents today for Darzalex  Faspro, B12 injection, and  Venofer  500 mg infusion. Patient is in satisfactory condition with no new complaints voiced.  Vital signs are stable.  Labs reviewed by Dr. Davonna during the office visit and all labs are within treatment parameters.  We will proceed with treatment per MD orders.   Treatment given today per MD orders. Tolerated infusion without adverse affects. Vital signs stable. No complaints at this time. Discharged from clinic via wheelchair in stable condition. Alert and oriented x 3. F/U with [name] as scheduled.

## 2023-11-21 NOTE — Patient Instructions (Signed)

## 2023-11-21 NOTE — Progress Notes (Signed)
 Patient Care Team: Jolinda Norene HERO, DO as PCP - General (Family Medicine) Alvan, Dorn FALCON, MD as PCP - Cardiology (Cardiology) Darlean Ned, MD (Rehabilitation) Shellia Oh, MD (Inactive) as Consulting Physician (Pulmonary Disease) Cindie Carlin POUR, DO as Consulting Physician (Internal Medicine) Grossman, Janelle, NP as Nurse Practitioner (Nurse Practitioner) Gaile Frederick, MD as Referring Physician (Gastroenterology) Celestia Joesph SQUIBB, RN as Oncology Nurse Navigator (Medical Oncology) Bertrum Rosina HERO, RN as [facility] Eartha Angelia Sieving, MD as Consulting Physician (Gastroenterology) Darlean Ozell NOVAK, MD as Consulting Physician (Pulmonary Disease)  Clinic Day:  11/21/2023  Referring physician: Jolinda Norene HERO, DO   CHIEF COMPLAINT:  CC: 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 female was transferred to my care after her prior physician has left.   ASSESSMENT & PLAN:   Assessment & Plan: Savannah  FREDDA Burns  is a 73 y.o. 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 Patient had worsening thrombocytopenia with a history of GI bleeding.  Held Revlimid  on 09/26/2023  - Patient is here for daratumumab  infusion today.  Tolerating well with no complaints.  Denies shortness of breath, upper respiratory tract infections. - Discussed with patient that we can start Revlimid  once her labs are more stable.  Can start at a lower dose of 5 mg daily 3 weeks on/1 week off - Continue daratumumab  monthly at this time - Continue acyclovir  for prophylaxis - Reviewed labs today:CMP: Stable, CBC: Hemoglobin improved to 8.5,  platelets improved to 101.  Recent myeloma labs from 10/24/2023: Improved M spike to 0.1 and stable free light chains  Return to clinic in 1 month with labs Iron  deficiency anemia due to chronic blood loss Patient has a history of iron  deficiency anemia likely secondary to GI AVMs S/p multiple IV iron  infusions Recent iron  labs within normal limit  - Will repeat iron  panel with next blood draw. Vitamin B12 deficiency Patient had previous vitamin B12 deficiency and is currently on parenteral supplementation  - Continue parenteral vitamin B12 supplementation for now.  Continue oral supplementation - Will repeat it with next blood draw and discontinue if high.    The patient understands the plans discussed today and is in agreement with them.  She knows to contact our office if she develops concerns prior to her next appointment.  55 minutes of total time was spent for this patient encounter, including preparation, face-to-face counseling with the patient and coordination of care, physical exam, and documentation of the encounter. > 50% of the time was spent on counseling as documented under my assessment and plan.    LILLETTE Verneta SAUNDERS Teague,acting as a Neurosurgeon for Mickiel Dry, MD.,have documented all relevant documentation on the behalf of Mickiel Dry, MD,as directed by  Mickiel Dry, MD while in the presence of Mickiel Dry, MD.   I, Mickiel Dry MD, have reviewed the above documentation for accuracy and completeness, and I agree with the above.     Mickiel Dry, MD  [facility] [place] - A DEPT OF [facility] [address] [place] [address] Dept: [phone] Dept Fax: [phone]   Orders Placed This Encounter  Procedures   Ferritin    Standing Status:   Future    Expected Date:   12/19/2023    Expiration Date:   03/18/2024   Folate    Standing Status:   Future    Expected Date:   12/19/2023     Expiration Date:   03/18/2024   Vitamin B12    Standing Status:   Future    Expected Date:   12/19/2023    Expiration Date:   03/18/2024   Kappa/lambda light chains    Standing Status:   Future    Expected Date:   12/19/2023    Expiration Date:   03/18/2024   Multiple Myeloma Panel (SPEP&IFE w/QIG)    Standing Status:   Future    Expected Date:   12/19/2023    Expiration Date:   03/18/2024   Iron  and TIBC    Standing Status:   Future    Expected Date:   12/19/2023    Expiration Date:   03/18/2024     ONCOLOGY HISTORY:   I have reviewed her chart and materials related to her cancer extensively and collaborated history with the patient. Summary of oncologic history is as follows:   Diagnosis: Stage II standard risk IgG kappa plasma cell myeloma   -07/09/2020: Initial Myeloma labs. M-spike 2.6. Gamma Globulin 3.0. Total Globulin 5.1. IgG 3727. FLC ratio 4.17 with elevated kappa light chains at 75.5.  -08/03/2020: PET whole body: Diffuse mild marrow activity in the appendicular skeleton is nonspecific. Query anemia marrow response. Consider bone marrow biopsy. No focal hypermetabolic activity in the skeleton. No soft tissue plasmacytoma.  No lytic lesion on CT portion exam. -08/04/2020: Bone marrow biopsy: Hypercellular bone marrow with plasma cell neoplasm. The plasma cells are increased in number representing 18% of all cells   - 07/16/2021: Skeletal survey: Faint lucencies noted throughout the skull with faint questionable lucency on proximal left femur.  - 10/22/2021: SPEP : M spike 3.1%. Increased kappa free light chains 139.7, with lambda light chains 10.7, and elevated ratio 13.06.  -10/29/2021: Skull x-rays: Multiple discrete round lucent/lytic lesions in the skull.  -11/18/2021: PET whole body: No evidence of active myeloma. No suspicious lytic lesions. No plasmacytoma.  -11/19/2021: Bone Marrow Biopsy.  Pathology:  Plasma cell myeloma involving approximately 40% of the cellular  marrow.  Myeloma FISH:  t(11;14)  Cytogenetics: Failed to grow metaphases  -12/14/2021 to current: DRd started with with lenalidomide  15 mg 3 weeks on/1 week off and dexamethasone  20 mg weekly  -09/26/2022 to 10/03/2022: Hospitalized with MSSA bacteremia, port removed, and cefepime given until 10/26/2022 -09/26/2023 to current: Revlimid  held due to worsening thrombocytopenia with history of GI bleeding.    Current Treatment:  Daratumumab  1800mg  monthly  INTERVAL HISTORY:   Valicia  G Mom is here today for follow up. Patient reports fatigue today. She denies any current new onset pain, cough or infection.   She was previously taking Revlimid  10 mg daily before holding the medication due to thrombocytopenia. She is agreeable to restart Revlimid  to 5 mg daily if labs remain improved.   I have reviewed the past medical history, past surgical history, social history and family history with the patient and they are unchanged from previous note.  ALLERGIES:  is allergic to keflex  [cephalexin ].  MEDICATIONS:  Current Outpatient Medications  Medication Sig Dispense Refill   acetaminophen  (TYLENOL ) 325 MG tablet Take 2 tablets (650 mg total) by mouth every 6 (six) hours as needed for mild pain (pain score  1-3) (or Fever >/= 101).     albuterol  (PROVENTIL ) (2.5 MG/3ML) 0.083% nebulizer solution Take 3 mLs (2.5 mg total) by nebulization every 4 (four) hours as needed for wheezing or shortness of breath. 75 mL 1   albuterol  (VENTOLIN  HFA) 108 (90 Base) MCG/ACT inhaler Inhale 2 puffs into the lungs every 6 (six) hours as needed for wheezing or shortness of breath. 8 g 3   bumetanide  (BUMEX ) 1 MG tablet Take 2 tablets (2 mg total) by mouth daily. 60 tablet 2   carvedilol  (COREG ) 6.25 MG tablet Take 1 tablet (6.25 mg total) by mouth 2 (two) times daily with a meal. 60 tablet 5   desvenlafaxine  (PRISTIQ ) 100 MG 24 hr tablet Take 100 mg by mouth in the morning.     dexamethasone  (DECADRON ) 4 MG tablet  Take 5 tablets once every 4 weeks prior to darzalex  20 tablet 3   esomeprazole  (NEXIUM ) 40 MG capsule Take 1 capsule (40 mg total) by mouth 2 (two) times daily before a meal. 180 capsule 3   ferrous sulfate  325 (65 FE) MG tablet Take 1 tablet (325 mg total) by mouth daily with breakfast. 30 tablet 3   fluticasone -salmeterol (ADVAIR) 100-50 MCG/ACT AEPB Inhale 1 puff into the lungs 2 (two) times daily. 60 each 5   furosemide  (LASIX ) 40 MG tablet Take 1 tablet (40 mg total) by mouth daily. 30 tablet 0   lenalidomide  (REVLIMID ) 15 MG capsule TAKE 1 CAPSULE BY MOUTH EVERY DAY FOR 21 DAYS ON THEN 7 DAYS OFF 21 capsule 0   lidocaine -prilocaine  (EMLA ) cream Apply 1 Application topically as needed. Apply a small amount to port a cath site and cover with plastic wrap 1 hour prior to infusion appointments 30 g 3   lovastatin  (MEVACOR ) 20 MG tablet Take 1 tablet (20 mg total) by mouth at bedtime. 90 tablet 3   methocarbamol  (ROBAXIN ) 750 MG tablet Take 1 tablet (750 mg total) by mouth 3 (three) times daily. 90 tablet 1   metolazone  (ZAROXOLYN ) 2.5 MG tablet Take 1 tablet (2.5 mg total) by mouth daily as needed. 30 tablet 0   nystatin  (MYCOSTATIN /NYSTOP ) powder Apply topically 2 (two) times daily. 15 g 1   oxyCODONE  (OXY IR/ROXICODONE ) 5 MG immediate release tablet Take 1 tablet (5 mg total) by mouth every 8 (eight) hours as needed for severe pain (pain score 7-10). 10 tablet 0   oxyCODONE -acetaminophen  (PERCOCET) 10-325 MG tablet Take 1 tablet by mouth 4 (four) times daily as needed.     OXYGEN  Inhale 3 L into the lungs continuous.     OZEMPIC , 0.25 OR 0.5 MG/DOSE, 2 MG/3ML SOPN Inject 0.25 mg into the skin every 7 (seven) days. 9 mL 3   potassium chloride  SA (KLOR-CON  M) 20 MEQ tablet Take 1 tablet (20 mEq total) by mouth 2 (two) times daily. 60 tablet 3   acyclovir  (ZOVIRAX ) 400 MG tablet Take 1 tablet (400 mg total) by mouth 2 (two) times daily. 60 tablet 4   No current facility-administered medications  for this visit.    REVIEW OF SYSTEMS:   Constitutional: Denies fevers, chills or abnormal weight loss Eyes: Denies blurriness of vision Ears, nose, mouth, throat, and face: Denies mucositis or sore throat Respiratory: Denies cough, dyspnea or wheezes Cardiovascular: Denies palpitation, chest discomfort or lower extremity swelling Gastrointestinal:  Denies nausea, heartburn or change in bowel habits Skin: Denies abnormal skin rashes Lymphatics: Denies new lymphadenopathy or easy bruising Neurological:Denies numbness, tingling or new weaknesses Behavioral/Psych: Mood is  stable, no new changes  All other systems were reviewed with the patient and are negative.   VITALS:  There were no vitals taken for this visit.  Wt Readings from Last 3 Encounters:  11/21/23 291 lb 10.7 oz (132.3 kg)  11/13/23 296 lb 3.2 oz (134.4 kg)  10/05/23 (!) 302 lb 4.8 oz (137.1 kg)    There is no height or weight on file to calculate BMI.  Performance status (ECOG): 2 - Symptomatic, <50% confined to bed  PHYSICAL EXAM:   GENERAL:alert, no distress and comfortable SKIN: skin color, texture, turgor are normal, no rashes or significant lesions EYES: normal, Conjunctiva are pink and non-injected, sclera clear LUNGS: clear to auscultation and percussion with normal breathing effort HEART: regular rate & rhythm and no murmurs and no lower extremity edema ABDOMEN:abdomen soft, non-tender and normal bowel sounds Musculoskeletal:no cyanosis of digits and no clubbing  NEURO: alert & oriented x 3 with fluent speech, no focal motor/sensory deficits  LABORATORY DATA:  I have reviewed the data as listed   Lab Results  Component Value Date   WBC 4.0 11/21/2023   NEUTROABS 2.5 11/21/2023   HGB 8.5 (L) 11/21/2023   HCT 28.4 (L) 11/21/2023   MCV 103.3 (H) 11/21/2023   PLT 101 (L) 11/21/2023      Chemistry      Component Value Date/Time   NA 142 11/21/2023 0823   NA 146 (H) 05/15/2023 0928   K 3.7  11/21/2023 0823   CL 103 11/21/2023 0823   CO2 29 11/21/2023 0823   BUN 14 11/21/2023 0823   BUN 11 05/15/2023 0928   CREATININE 0.59 11/21/2023 0823   CREATININE 0.71 07/29/2015 1110      Component Value Date/Time   CALCIUM  8.4 (L) 11/21/2023 0823   CALCIUM  8.5 (L) 02/09/2023 0859   ALKPHOS 86 11/21/2023 0823   AST 23 11/21/2023 0823   ALT 15 11/21/2023 0823   BILITOT 0.6 11/21/2023 0823   BILITOT 0.2 10/29/2019 1644       Latest Reference Range & Units 10/24/23 09:51  Total Protein ELP 6.0 - 8.5 g/dL 4.9 (L)  Albumin ELP 2.9 - 4.4 g/dL 2.9  Globulin, Total 2.2 - 3.9 g/dL 2.0 (L) (C)  A/G Ratio 0.7 - 1.7  1.5 (C)  Alpha-1-Globulin 0.0 - 0.4 g/dL 0.3  Joeyj-7-Honalopw 0.4 - 1.0 g/dL 0.6  Beta Globulin 0.7 - 1.3 g/dL 0.7  Gamma Globulin 0.4 - 1.8 g/dL 0.4  M-SPIKE, % Not Observed g/dL 0.1 (H)  SPE Interp.  Comment  Comment  Comment  (L): Data is abnormally low (H): Data is abnormally high (C): Corrected   Latest Reference Range & Units 10/24/23 09:51  Kappa free light chain 3.3 - 19.4 mg/L 11.1  Lambda free light chains 5.7 - 26.3 mg/L 4.1 (L)  Kappa, lambda light chain ratio 0.26 - 1.65  2.71 (H)  (L): Data is abnormally low (H): Data is abnormally high   Latest Reference Range & Units 04/13/23 08:53  Iron  28 - 170 ug/dL 95  UIBC ug/dL 800  TIBC 749 - 549 ug/dL 705  Saturation Ratios 10.4 - 31.8 % 32 (H)  Ferritin 11 - 307 ng/mL 858 (H)  (H): Data is abnormally high  RADIOGRAPHIC STUDIES: I have personally reviewed the radiological images as listed and agreed with the findings in the report.  None new to review

## 2023-11-21 NOTE — Assessment & Plan Note (Addendum)
 Patient has a history of iron  deficiency anemia likely secondary to GI AVMs S/p multiple IV iron  infusions Recent iron  labs within normal limit  - Will repeat iron  panel with next blood draw.

## 2023-11-24 DIAGNOSIS — J449 Chronic obstructive pulmonary disease, unspecified: Secondary | ICD-10-CM | POA: Diagnosis not present

## 2023-11-27 NOTE — Progress Notes (Unsigned)
 Cardiology Office Note:  .   Date:  11/28/2023  ID:  [name], DOB [birth year], MRN [id number] PCP: Jolinda Norene HERO, DO  [facility] Providers Cardiologist:  Alvan Carrier, MD {  History of Present Illness: .   Savannah  Savannah Burns is a 73 y.o. female  with PMHx of chronic HFpEF (EF preserved since 2016 >60% with most recent EF 09/2022 65 to 70%), COPD on home oxygen , hypertension, hyperlipidemia, type 2 diabetes, moderate aortic stenosis (echo 09/2022), iron /Vit B12 deficiency anemia likely secondary to recurrent GI AVM s/p multiple iron  and blood transfusions, Multiple Myeloma (dx'd 11/30/2021, followed by heme-onc), adrenal adenoma, depression who reports to [facility] office for follow up.   Last seen in heartcare OV 05/06/2022 with Tylene Lunch, NP for follow up. Reported fatigue, SOB with DOE on chronic O2 therapy, peripheral edema with heart murmur auscultated on exam. Despite these complaints, noted overall she was doing well.  Also noted, she was continuing to receive blood transfusions until 5-day admission at [facility] where she was evaluated by GI and they found source of bleeding in small intestine.  Since that time she has continued to get blood and iron  transfusions due to undergoing treatment for multiple myeloma.  She was unsure of which diuretic she was taking but medication listed Lasix  20 mg daily and torsemide  50 mg twice daily.  Continued on carvedilol  6.25 mg twice daily, lovastatin  20 mg daily, KCl 40 mEq daily.  Ordered echo 06/03/2022 which showed EF 75%, mild LVH, G1DD, moderately dilated LA/RA, moderate mitral annular calcification, moderate AV stenosis, dilated IVC.   Repeat echo on 09/27/2022 due to sepsis showed EF 65 to 70%, mild LVH, G1 DD, elevated LA pressure, mildly elevated PASP, mildly dilated LA/RA, mild MV regurgitation, moderate AV stenosis, mildly dilated ascending aorta measuring 40 mm, dilated IVC.   Today, reports worsening SOB X 1 year with minimal  exertion that is relieved with rest and O2.  O2 has been stable at 3 L.  Also reports worsening lower extremity edema X 4 years but normal on exam today.  She has been sleeping in recliner for the past several years.  Denies any chest pain, palpitations, dizziness, syncope. Reports compliance with medications.  Endorses low salt diet.  Drinks approximately 46 ounces of water  daily.  She is not very active due to O2 requirements.  Patient is able to bathe herself but requires assistance getting into the tub.  She is able to walk indoors but limited by SOB and O2 tank.  She is able to wash dishes after her daughter sets up O2 tank. Previously smoked but quit in 04/2015. Denies tobacco use/alcohol /drug use.  She needs cardiac clearance for pending colonoscopy.  ROS: 10 point review of system has been reviewed and considered negative except ones been listed in the HPI.   Studies Reviewed: SABRA   ECHO 05/2022 IMPRESSIONS   1. Left ventricular ejection fraction, by estimation, is >75%. The left ventricle has hyperdynamic function. The left ventricle has no regional wall motion abnormalities. There is mild left ventricular hypertrophy. Left ventricular diastolic parameters  are consistent with Grade I diastolic dysfunction (impaired relaxation).   2. Right ventricular systolic function is normal. The right ventricular size is normal. Tricuspid regurgitation signal is inadequate for assessing PA pressure.   3. Left atrial size was moderately dilated.   4. Right atrial size was moderately dilated.   5. The mitral valve is abnormal. No evidence of mitral valve regurgitation. No evidence of mitral  stenosis. Moderate mitral annular calcification.   6. The aortic valve is calcified. There is severe calcifcation of the aortic valve. Aortic valve regurgitation is not visualized. Moderate aortic valve stenosis. Aortic valve area, by VTI measures 1.12 cm. Aortic valve mean gradient measures 23.0 mmHg.   Aortic valve Vmax  measures 3.42 m/s.   7. The inferior vena cava is dilated in size with >50% respiratory variability, suggesting right atrial pressure of 8 mmHg.   8. Increased flow velocities may be secondary to anemia, thyrotoxicosis,  hyperdynamic or high flow state.   Comparison(s): Changes from prior study are noted. Mild AS in 04/2019  worsened to Moderate AS now.   ECHO impression 09/2022   1. Left ventricular ejection fraction, by estimation, is 65 to 70%. The left ventricle has normal function. The left ventricle has no regional wall motion abnormalities. There is mild left ventricular hypertrophy. Left ventricular diastolic parameters  are consistent with Grade I diastolic dysfunction (impaired relaxation).  Elevated left atrial pressure.   2. Right ventricular systolic function is normal. The right ventricular size is normal. There is mildly elevated pulmonary artery systolic pressure.   3. Left atrial size was mildly dilated.   4. Right atrial size was mildly dilated.   5. The mitral valve is abnormal. Mild mitral valve regurgitation.   6. The aortic valve has an indeterminant number of cusps. There is  moderate calcification of the aortic valve. There is moderate thickening of the aortic valve. Aortic valve regurgitation is not visualized. Mild  aortic valve stenosis. Aortic valve mean gradient measures 18.0 mmHg. Aortic valve peak gradient measures 35.3 mmHg. Aortic valve area, by VTI measures 1.87 cm.   7. Aortic dilatation noted. There is mild dilatation of the ascending aorta, measuring 40 mm.   8. The inferior vena cava is dilated in size with >50% respiratory variability, suggesting right atrial pressure of 8 mmHg.   Physical Exam:   VS:  BP 134/64 (BP Location: Left Wrist, Cuff Size: Normal)   Pulse 98   Ht 5' 2 (1.575 m)   Wt 293 lb (132.9 kg)   SpO2 94% Comment: on 3 liters NS oxygen   BMI 53.59 kg/m    Wt Readings from Last 3 Encounters:  11/28/23 293 lb (132.9 kg)  11/21/23 291  lb 10.7 oz (132.3 kg)  11/13/23 296 lb 3.2 oz (134.4 kg)    GEN: Obese female in no acute distress while sitting in chair. Accompanied by granddaughter and great granddaughter. NECK: No JVD; No carotid bruits CARDIAC: RRR, 2/6 systolic murmur in LUSB. no rubs, gallops RESPIRATORY:  Clear to auscultation without rales, wheezing or rhonchi  ABDOMEN: Soft, non-tender, non-distended EXTREMITIES:  No edema; No deformity   ASSESSMENT AND PLAN: .   Chronic heart failure with preserved ejection fraction (HCC) EF preserved since 2016 >60% with most recent EF 09/2022 65 to 70%. ECHO 06/03/2022 which showed EF 75%, mild LVH, G1DD, moderately dilated LA/RA, dilated IVC.  Repeat echo on 09/27/2022 due to sepsis showed EF 65 to 70%, mild LVH, G1DD, elevated LA pressure, mildly elevated PASP, mildly dilated LA/RA, mildly dilated ascending aorta measuring 40 mm, dilated IVC.  Reports worsening SOB X 1 year, lower extremity edema X 4 years, and orthopnea for several years Appears Euvolemic on exam.  Continue on Carvedilol  6.25 mg twice daily and Lasix  40 mg daily.  Patient listed as being on Lasix  and Bumex . She states she is taking Lasix  as above. Will verify most recent rx with pharmacy.  Order ECHO.  Encouraged low sodium diet, fluid restriction <2L, and daily weights.  Educated to contact our office for weight gain of 2 lbs overnight or 5 lbs in one week. ED precautions discussed.    Hypertension associated with diabetes (HCC) BP this OV well controlled today: 134/64 Managed by GDMT as above.  Encourage physical activity for 150 minutes per week and heart healthy low sodium diet. Discussed limiting sodium intake to < 2 grams daily.     Hyperlipidemia associated with type 2 diabetes mellitus (HCC) LDL 96 in 01/2023 & LFT WNL in 10/2023 Continue on Lovastatin  20 mg daily  Moderate aortic stenosis by prior echocardiogram Echo on 09/27/2022: mild MV regurgitation, moderate AV stenosis 2/6 systolic murmur in  LUSB Order ECHO for reevaluation.   Iron  deficiency anemia due to chronic blood loss She continues to have iron  infusions and blood transfusions every other week. This continues to be followed by hematology and GI.   Multiple myeloma not having achieved remission [facility]) Currently undergoing chemotherapy initial diagnosis was made in 11/30/2021  Followed by heme-onc   Preoperative Cardiovascular Risk Assessment Colonoscopy, TBD  Toribio Fortune, MD Gastroenterology and Hepatology [facility]  Revised Cardiac Risk Index (RCRI) with 1 point and perioperative risk of major cardiac events is  0.9 %.  D[facility] Activity Status Index (DASI) with 3.63 mets.  Patients activity is limited due to chronic SOB and hindrance of carrying O2 tank, however pulmonary has cleared for coloscopy.  Per Pulm note 11/13/2023, pulmonary risk include weight and chronically sleeping in recliner. Noted concern for lying horizontal during procedure and may require some form of assistance with minimal sedation with Bipap or intubation as last resort.  From cardiac standpoint, this patient is at acceptable risk for the planned procedure based on the results of ECHO.  If moderate AV stenosis then can proceed with colonscopy at [facility]. If severe AS then will consult with Dr. Alvan to assess if colonscopy needs to be completed at [facility].     Dispo: Follow up in 3-4 months  Signed, Lorette CINDERELLA Kapur, PA-C

## 2023-11-28 ENCOUNTER — Ambulatory Visit: Attending: Student | Admitting: Physician Assistant

## 2023-11-28 ENCOUNTER — Encounter: Payer: Self-pay | Admitting: Physician Assistant

## 2023-11-28 VITALS — BP 134/64 | HR 98 | Ht 62.0 in | Wt 293.0 lb

## 2023-11-28 DIAGNOSIS — C9000 Multiple myeloma not having achieved remission: Secondary | ICD-10-CM

## 2023-11-28 DIAGNOSIS — E1159 Type 2 diabetes mellitus with other circulatory complications: Secondary | ICD-10-CM

## 2023-11-28 DIAGNOSIS — E785 Hyperlipidemia, unspecified: Secondary | ICD-10-CM

## 2023-11-28 DIAGNOSIS — R0600 Dyspnea, unspecified: Secondary | ICD-10-CM

## 2023-11-28 DIAGNOSIS — I5032 Chronic diastolic (congestive) heart failure: Secondary | ICD-10-CM | POA: Diagnosis not present

## 2023-11-28 DIAGNOSIS — M7989 Other specified soft tissue disorders: Secondary | ICD-10-CM

## 2023-11-28 DIAGNOSIS — I350 Nonrheumatic aortic (valve) stenosis: Secondary | ICD-10-CM | POA: Diagnosis not present

## 2023-11-28 DIAGNOSIS — Z01810 Encounter for preprocedural cardiovascular examination: Secondary | ICD-10-CM

## 2023-11-28 DIAGNOSIS — D500 Iron deficiency anemia secondary to blood loss (chronic): Secondary | ICD-10-CM

## 2023-11-28 DIAGNOSIS — E1169 Type 2 diabetes mellitus with other specified complication: Secondary | ICD-10-CM | POA: Diagnosis not present

## 2023-11-28 DIAGNOSIS — I152 Hypertension secondary to endocrine disorders: Secondary | ICD-10-CM

## 2023-11-28 NOTE — Patient Instructions (Signed)
 Medication Instructions:  Your physician recommends that you continue on your current medications as directed. Please refer to the Current Medication list given to you today.  *If you need a refill on your cardiac medications before your next appointment, please call your pharmacy*  Lab Work: NONE   If you have labs (blood work) drawn today and your tests are completely normal, you will receive your results only by: MyChart Message (if you have MyChart) OR A paper copy in the mail If you have any lab test that is abnormal or we need to change your treatment, we will call you to review the results.  Testing/Procedures: Your physician has requested that you have an echocardiogram. Echocardiography is a painless test that uses sound waves to create images of your heart. It provides your doctor with information about the size and shape of your heart and how well your heart's chambers and valves are working. This procedure takes approximately one hour. There are no restrictions for this procedure. Please do NOT wear cologne, perfume, aftershave, or lotions (deodorant is allowed). Please arrive 15 minutes prior to your appointment time.  Please note: We ask at that you not bring children with you during ultrasound (echo/ vascular) testing. Due to room size and safety concerns, children are not allowed in the ultrasound rooms during exams. Our front office staff cannot provide observation of children in our lobby area while testing is being conducted. An adult accompanying a patient to their appointment will only be allowed in the ultrasound room at the discretion of the ultrasound technician under special circumstances. We apologize for any inconvenience.   Follow-Up: At [facility], you and your health needs are our priority.  As part of our continuing mission to provide you with exceptional heart care, our providers are all part of one team.  This team includes your primary Cardiologist  (physician) and Advanced Practice Providers or APPs (Physician Assistants and Nurse Practitioners) who all work together to provide you with the care you need, when you need it.  Your next appointment:   3 -4 month(s)  Provider:   Dorn Ross, MD    We recommend signing up for the patient portal called MyChart.  Sign up information is provided on this After Visit Summary.  MyChart is used to connect with patients for Virtual Visits (Telemedicine).  Patients are able to view lab/test results, encounter notes, upcoming appointments, etc.  Non-urgent messages can be sent to your provider as well.   To learn more about what you can do with MyChart, go to [email or web address].   Other Instructions Thank you for choosing [facility]!

## 2023-11-29 ENCOUNTER — Other Ambulatory Visit: Payer: Self-pay

## 2023-12-05 ENCOUNTER — Inpatient Hospital Stay: Attending: Hematology

## 2023-12-05 VITALS — BP 134/61 | HR 89 | Temp 97.6°F | Resp 18 | Wt 293.0 lb

## 2023-12-05 DIAGNOSIS — Z79899 Other long term (current) drug therapy: Secondary | ICD-10-CM | POA: Insufficient documentation

## 2023-12-05 DIAGNOSIS — D500 Iron deficiency anemia secondary to blood loss (chronic): Secondary | ICD-10-CM | POA: Insufficient documentation

## 2023-12-05 MED ORDER — SODIUM CHLORIDE 0.9 % IV SOLN
500.0000 mg | Freq: Once | INTRAVENOUS | Status: AC
  Administered 2023-12-05: 500 mg via INTRAVENOUS
  Filled 2023-12-05: qty 20

## 2023-12-05 MED ORDER — CETIRIZINE HCL 10 MG PO TABS
10.0000 mg | ORAL_TABLET | Freq: Once | ORAL | Status: AC
  Administered 2023-12-05: 10 mg via ORAL
  Filled 2023-12-05: qty 1

## 2023-12-05 MED ORDER — SODIUM CHLORIDE 0.9 % IV SOLN: Freq: Once | INTRAVENOUS | Status: AC

## 2023-12-05 NOTE — Progress Notes (Signed)
 Patient tolerated iron  infusion with no complaints voiced.  Port site clean and dry with good blood return noted before and after infusion.  Band aid applied. Pt observed for 30 minutes post iron  infusion without any complications. VSS with discharge and left in satisfactory condition with no s/s of distress noted. All follow ups as scheduled.   Savannah Burns [employer]

## 2023-12-05 NOTE — Patient Instructions (Signed)

## 2023-12-06 ENCOUNTER — Other Ambulatory Visit: Payer: Self-pay | Admitting: *Deleted

## 2023-12-06 DIAGNOSIS — C9000 Multiple myeloma not having achieved remission: Secondary | ICD-10-CM

## 2023-12-08 ENCOUNTER — Telehealth: Payer: Self-pay | Admitting: *Deleted

## 2023-12-08 ENCOUNTER — Encounter: Payer: Self-pay | Admitting: *Deleted

## 2023-12-08 NOTE — Patient Instructions (Signed)
 Savannah Burns - I am sorry I was unable to reach you today for our scheduled appointment. I work with Jolinda Norene HERO, DO and am calling to support your healthcare needs. Please contact me at [phone] at your earliest convenience. I look forward to speaking with you soon.   Thank you,  Savannah Burns, BSN RN [facility], [facility] RN Care Manager Direct Dial: [phone]  Fax: [phone]

## 2023-12-12 ENCOUNTER — Other Ambulatory Visit: Payer: Self-pay | Admitting: Oncology

## 2023-12-12 DIAGNOSIS — C9000 Multiple myeloma not having achieved remission: Secondary | ICD-10-CM

## 2023-12-15 ENCOUNTER — Other Ambulatory Visit (HOSPITAL_COMMUNITY): Payer: Self-pay | Admitting: Internal Medicine

## 2023-12-15 DIAGNOSIS — Z780 Asymptomatic menopausal state: Secondary | ICD-10-CM

## 2023-12-19 ENCOUNTER — Inpatient Hospital Stay

## 2023-12-19 ENCOUNTER — Other Ambulatory Visit

## 2023-12-19 ENCOUNTER — Ambulatory Visit

## 2023-12-19 ENCOUNTER — Inpatient Hospital Stay: Admitting: Oncology

## 2023-12-19 ENCOUNTER — Ambulatory Visit: Admitting: Oncology

## 2023-12-19 NOTE — Progress Notes (Signed)
 Hi Amber,   Pt has scheduled appt with Rosina Bucco on 12/26/23. No need for reschedule closed out RS.   Thank you, Shereen Gin [facility] VBCI Assistant Direct Dial: [phone]  Fax: [phone] Website: [email or web address]

## 2023-12-20 ENCOUNTER — Ambulatory Visit (HOSPITAL_COMMUNITY): Admission: RE | Admit: 2023-12-20 | Source: Ambulatory Visit

## 2023-12-24 ENCOUNTER — Other Ambulatory Visit: Payer: Self-pay

## 2023-12-24 ENCOUNTER — Encounter (HOSPITAL_COMMUNITY): Payer: Self-pay | Admitting: *Deleted

## 2023-12-24 ENCOUNTER — Inpatient Hospital Stay (HOSPITAL_COMMUNITY)
Admission: EM | Admit: 2023-12-24 | Discharge: 2023-12-28 | DRG: 177 | Disposition: A | Discharge status: Home / Self Care | Attending: Family Medicine | Admitting: Family Medicine

## 2023-12-24 ENCOUNTER — Emergency Department (HOSPITAL_COMMUNITY)

## 2023-12-24 DIAGNOSIS — J9601 Acute respiratory failure with hypoxia: Principal | ICD-10-CM

## 2023-12-24 DIAGNOSIS — D3502 Benign neoplasm of left adrenal gland: Secondary | ICD-10-CM | POA: Diagnosis present

## 2023-12-24 DIAGNOSIS — Z87891 Personal history of nicotine dependence: Secondary | ICD-10-CM

## 2023-12-24 DIAGNOSIS — Z881 Allergy status to other antibiotic agents status: Secondary | ICD-10-CM

## 2023-12-24 DIAGNOSIS — Z7985 Long-term (current) use of injectable non-insulin antidiabetic drugs: Secondary | ICD-10-CM | POA: Diagnosis not present

## 2023-12-24 DIAGNOSIS — K921 Melena: Secondary | ICD-10-CM | POA: Diagnosis present

## 2023-12-24 DIAGNOSIS — G4733 Obstructive sleep apnea (adult) (pediatric): Secondary | ICD-10-CM | POA: Diagnosis present

## 2023-12-24 DIAGNOSIS — K766 Portal hypertension: Secondary | ICD-10-CM | POA: Diagnosis not present

## 2023-12-24 DIAGNOSIS — Z7951 Long term (current) use of inhaled steroids: Secondary | ICD-10-CM

## 2023-12-24 DIAGNOSIS — K922 Gastrointestinal hemorrhage, unspecified: Secondary | ICD-10-CM

## 2023-12-24 DIAGNOSIS — Z8249 Family history of ischemic heart disease and other diseases of the circulatory system: Secondary | ICD-10-CM | POA: Diagnosis not present

## 2023-12-24 DIAGNOSIS — E1159 Type 2 diabetes mellitus with other circulatory complications: Secondary | ICD-10-CM | POA: Diagnosis present

## 2023-12-24 DIAGNOSIS — Z85828 Personal history of other malignant neoplasm of skin: Secondary | ICD-10-CM | POA: Diagnosis not present

## 2023-12-24 DIAGNOSIS — I872 Venous insufficiency (chronic) (peripheral): Secondary | ICD-10-CM | POA: Diagnosis present

## 2023-12-24 DIAGNOSIS — U071 COVID-19: Principal | ICD-10-CM | POA: Diagnosis present

## 2023-12-24 DIAGNOSIS — K7469 Other cirrhosis of liver: Secondary | ICD-10-CM

## 2023-12-24 DIAGNOSIS — D649 Anemia, unspecified: Secondary | ICD-10-CM | POA: Diagnosis present

## 2023-12-24 DIAGNOSIS — Z832 Family history of diseases of the blood and blood-forming organs and certain disorders involving the immune mechanism: Secondary | ICD-10-CM

## 2023-12-24 DIAGNOSIS — Z860100 Personal history of colon polyps, unspecified: Secondary | ICD-10-CM

## 2023-12-24 DIAGNOSIS — J9621 Acute and chronic respiratory failure with hypoxia: Secondary | ICD-10-CM | POA: Diagnosis present

## 2023-12-24 DIAGNOSIS — E119 Type 2 diabetes mellitus without complications: Secondary | ICD-10-CM

## 2023-12-24 DIAGNOSIS — Z723 Lack of physical exercise: Secondary | ICD-10-CM

## 2023-12-24 DIAGNOSIS — J449 Chronic obstructive pulmonary disease, unspecified: Secondary | ICD-10-CM | POA: Diagnosis not present

## 2023-12-24 DIAGNOSIS — Z800 Family history of malignant neoplasm of digestive organs: Secondary | ICD-10-CM

## 2023-12-24 DIAGNOSIS — Z79899 Other long term (current) drug therapy: Secondary | ICD-10-CM | POA: Diagnosis not present

## 2023-12-24 DIAGNOSIS — Z833 Family history of diabetes mellitus: Secondary | ICD-10-CM | POA: Diagnosis not present

## 2023-12-24 DIAGNOSIS — D696 Thrombocytopenia, unspecified: Secondary | ICD-10-CM | POA: Diagnosis not present

## 2023-12-24 DIAGNOSIS — K7460 Unspecified cirrhosis of liver: Secondary | ICD-10-CM | POA: Diagnosis present

## 2023-12-24 DIAGNOSIS — F32A Depression, unspecified: Secondary | ICD-10-CM | POA: Diagnosis present

## 2023-12-24 DIAGNOSIS — E1169 Type 2 diabetes mellitus with other specified complication: Secondary | ICD-10-CM | POA: Diagnosis not present

## 2023-12-24 DIAGNOSIS — D509 Iron deficiency anemia, unspecified: Secondary | ICD-10-CM | POA: Diagnosis not present

## 2023-12-24 DIAGNOSIS — Z9981 Dependence on supplemental oxygen: Secondary | ICD-10-CM

## 2023-12-24 DIAGNOSIS — E785 Hyperlipidemia, unspecified: Secondary | ICD-10-CM | POA: Diagnosis present

## 2023-12-24 DIAGNOSIS — I5033 Acute on chronic diastolic (congestive) heart failure: Secondary | ICD-10-CM | POA: Diagnosis present

## 2023-12-24 DIAGNOSIS — C9000 Multiple myeloma not having achieved remission: Secondary | ICD-10-CM | POA: Diagnosis present

## 2023-12-24 DIAGNOSIS — I350 Nonrheumatic aortic (valve) stenosis: Secondary | ICD-10-CM | POA: Diagnosis present

## 2023-12-24 DIAGNOSIS — Z604 Social exclusion and rejection: Secondary | ICD-10-CM | POA: Diagnosis present

## 2023-12-24 DIAGNOSIS — I152 Hypertension secondary to endocrine disorders: Secondary | ICD-10-CM | POA: Diagnosis present

## 2023-12-24 DIAGNOSIS — Z91148 Patient's other noncompliance with medication regimen for other reason: Secondary | ICD-10-CM

## 2023-12-24 DIAGNOSIS — I8510 Secondary esophageal varices without bleeding: Secondary | ICD-10-CM | POA: Diagnosis present

## 2023-12-24 DIAGNOSIS — J441 Chronic obstructive pulmonary disease with (acute) exacerbation: Secondary | ICD-10-CM | POA: Diagnosis present

## 2023-12-24 DIAGNOSIS — I509 Heart failure, unspecified: Secondary | ICD-10-CM

## 2023-12-24 DIAGNOSIS — Z6841 Body Mass Index (BMI) 40.0 and over, adult: Secondary | ICD-10-CM | POA: Diagnosis not present

## 2023-12-24 DIAGNOSIS — Z825 Family history of asthma and other chronic lower respiratory diseases: Secondary | ICD-10-CM

## 2023-12-24 DIAGNOSIS — D61818 Other pancytopenia: Secondary | ICD-10-CM | POA: Diagnosis present

## 2023-12-24 LAB — CBC WITH DIFFERENTIAL/PLATELET
Abs Immature Granulocytes: 0.03 K/uL (ref 0.00–0.07)
Basophils Absolute: 0 K/uL (ref 0.0–0.1)
Basophils Relative: 0 %
Eosinophils Absolute: 0 K/uL (ref 0.0–0.5)
Eosinophils Relative: 1 %
HCT: 21.2 % — ABNORMAL LOW (ref 36.0–46.0)
Hemoglobin: 6 g/dL — CL (ref 12.0–15.0)
Immature Granulocytes: 1 %
Lymphocytes Relative: 24 %
Lymphs Abs: 0.9 K/uL (ref 0.7–4.0)
MCH: 29.3 pg (ref 26.0–34.0)
MCHC: 28.3 g/dL — ABNORMAL LOW (ref 30.0–36.0)
MCV: 103.4 fL — ABNORMAL HIGH (ref 80.0–100.0)
Monocytes Absolute: 0.3 K/uL (ref 0.1–1.0)
Monocytes Relative: 8 %
Neutro Abs: 2.6 K/uL (ref 1.7–7.7)
Neutrophils Relative %: 66 %
Platelets: 92 K/uL — ABNORMAL LOW (ref 150–400)
RBC: 2.05 MIL/uL — ABNORMAL LOW (ref 3.87–5.11)
RDW: 17.5 % — ABNORMAL HIGH (ref 11.5–15.5)
WBC: 3.9 K/uL — ABNORMAL LOW (ref 4.0–10.5)
nRBC: 0 % (ref 0.0–0.2)

## 2023-12-24 LAB — COMPREHENSIVE METABOLIC PANEL WITH GFR
ALT: 11 U/L (ref 0–44)
AST: 16 U/L (ref 15–41)
Albumin: 2.8 g/dL — ABNORMAL LOW (ref 3.5–5.0)
Alkaline Phosphatase: 76 U/L (ref 38–126)
Anion gap: 8 (ref 5–15)
BUN: 15 mg/dL (ref 8–23)
CO2: 31 mmol/L (ref 22–32)
Calcium: 8.1 mg/dL — ABNORMAL LOW (ref 8.9–10.3)
Chloride: 102 mmol/L (ref 98–111)
Creatinine, Ser: 0.56 mg/dL (ref 0.44–1.00)
GFR, Estimated: >60 mL/min (ref >60)
Glucose, Bld: 149 mg/dL — ABNORMAL HIGH (ref 70–99)
Potassium: 3.6 mmol/L (ref 3.5–5.1)
Sodium: 141 mmol/L (ref 135–145)
Total Bilirubin: 0.6 mg/dL (ref 0.0–1.2)
Total Protein: 5 g/dL — ABNORMAL LOW (ref 6.5–8.1)

## 2023-12-24 LAB — PROTIME-INR
INR: 1 (ref 0.8–1.2)
Prothrombin Time: 13.7 s (ref 11.4–15.2)

## 2023-12-24 LAB — RESP PANEL BY RT-PCR (RSV, FLU A&B, COVID)  RVPGX2
Influenza A by PCR: NEGATIVE
Influenza B by PCR: NEGATIVE
Resp Syncytial Virus by PCR: NEGATIVE
SARS Coronavirus 2 by RT PCR: POSITIVE — AB

## 2023-12-24 LAB — D-DIMER, QUANTITATIVE: D-Dimer, Quant: 1.16 ug{FEU}/mL — ABNORMAL HIGH (ref 0.00–0.50)

## 2023-12-24 LAB — GLUCOSE, CAPILLARY: Glucose-Capillary: 156 mg/dL — ABNORMAL HIGH (ref 70–99)

## 2023-12-24 LAB — FERRITIN: Ferritin: 57 ng/mL (ref 11–307)

## 2023-12-24 LAB — LACTIC ACID, PLASMA
Lactic Acid, Venous: 1.2 mmol/L (ref 0.5–1.9)
Lactic Acid, Venous: 1.3 mmol/L (ref 0.5–1.9)

## 2023-12-24 LAB — POC OCCULT BLOOD, ED

## 2023-12-24 LAB — PREPARE RBC (CROSSMATCH)

## 2023-12-24 LAB — BRAIN NATRIURETIC PEPTIDE: B Natriuretic Peptide: 55 pg/mL (ref 0.0–100.0)

## 2023-12-24 MED ORDER — METHYLPREDNISOLONE SODIUM SUCC 125 MG IJ SOLR
0.5000 mg/kg | Freq: Two times a day (BID) | INTRAMUSCULAR | Status: AC
  Administered 2023-12-24 – 2023-12-27 (×6): 65.625 mg via INTRAVENOUS
  Filled 2023-12-24 (×6): qty 2

## 2023-12-24 MED ORDER — INSULIN ASPART 100 UNIT/ML IJ SOLN
0.0000 [IU] | Freq: Three times a day (TID) | INTRAMUSCULAR | Status: DC
  Administered 2023-12-25: 5 [IU] via SUBCUTANEOUS
  Administered 2023-12-25: 3 [IU] via SUBCUTANEOUS
  Administered 2023-12-25 – 2023-12-26 (×2): 5 [IU] via SUBCUTANEOUS
  Administered 2023-12-26: 3 [IU] via SUBCUTANEOUS
  Administered 2023-12-26 – 2023-12-27 (×2): 5 [IU] via SUBCUTANEOUS
  Administered 2023-12-27: 3 [IU] via SUBCUTANEOUS
  Administered 2023-12-27: 5 [IU] via SUBCUTANEOUS
  Administered 2023-12-28: 3 [IU] via SUBCUTANEOUS

## 2023-12-24 MED ORDER — CEFTRIAXONE SODIUM 1 G IJ SOLR
1.0000 g | Freq: Once | INTRAMUSCULAR | Status: AC
  Administered 2023-12-24: 1 g via INTRAVENOUS
  Filled 2023-12-24: qty 10

## 2023-12-24 MED ORDER — PREDNISONE 20 MG PO TABS
50.0000 mg | ORAL_TABLET | Freq: Every day | ORAL | Status: DC
Start: 2023-12-28 — End: 2023-12-28
  Administered 2023-12-28: 50 mg via ORAL
  Filled 2023-12-24: qty 1

## 2023-12-24 MED ORDER — PANTOPRAZOLE SODIUM 40 MG IV SOLR
80.0000 mg | Freq: Once | INTRAVENOUS | Status: AC
  Administered 2023-12-24: 80 mg via INTRAVENOUS
  Filled 2023-12-24: qty 20

## 2023-12-24 MED ORDER — OCTREOTIDE ACETATE 500 MCG/ML IJ SOLN
INTRAMUSCULAR | Status: AC
  Filled 2023-12-24: qty 1

## 2023-12-24 MED ORDER — SODIUM CHLORIDE 0.9% IV SOLUTION: Freq: Once | INTRAVENOUS | Status: DC

## 2023-12-24 MED ORDER — OCTREOTIDE LOAD VIA INFUSION
50.0000 ug | Freq: Once | INTRAVENOUS | Status: AC
  Administered 2023-12-24: 50 ug via INTRAVENOUS
  Filled 2023-12-24: qty 25

## 2023-12-24 MED ORDER — PANTOPRAZOLE SODIUM 40 MG IV SOLR
40.0000 mg | Freq: Two times a day (BID) | INTRAVENOUS | Status: DC
  Administered 2023-12-25 – 2023-12-28 (×7): 40 mg via INTRAVENOUS
  Filled 2023-12-24 (×7): qty 10

## 2023-12-24 MED ORDER — FUROSEMIDE 10 MG/ML IJ SOLN
40.0000 mg | Freq: Once | INTRAMUSCULAR | Status: AC
  Administered 2023-12-24: 40 mg via INTRAVENOUS
  Filled 2023-12-24: qty 4

## 2023-12-24 MED ORDER — INSULIN ASPART 100 UNIT/ML IJ SOLN
0.0000 [IU] | Freq: Every day | INTRAMUSCULAR | Status: DC
  Administered 2023-12-25 – 2023-12-27 (×3): 2 [IU] via SUBCUTANEOUS

## 2023-12-24 MED ORDER — SODIUM CHLORIDE 0.9 % IV SOLN
50.0000 ug/h | INTRAVENOUS | Status: DC
  Administered 2023-12-24 – 2023-12-26 (×4): 50 ug/h via INTRAVENOUS
  Filled 2023-12-24 (×12): qty 1

## 2023-12-24 NOTE — ED Triage Notes (Signed)
 I think I have pneumonia + cough and fever. Denies any N/V. + dizziness

## 2023-12-24 NOTE — ED Notes (Signed)
 X-ray at bedside.

## 2023-12-24 NOTE — ED Notes (Signed)
 ED TO INPATIENT HANDOFF REPORT  ED Nurse Name and Phone #: Sherlean RAMAN Name/Age/Gender Savannah Burns 73 y.o. female Room/Bed: APA19/APA19  Code Status   Code Status: Prior  Home/SNF/Other Home Patient oriented to: self, place, time, and situation Is this baseline? Yes   Triage Complete: Triage complete  Chief Complaint Acute on chronic respiratory failure (HCC) [J96.20]  Triage Note I think I have pneumonia + cough and fever. Denies any N/V. + dizziness   Allergies Allergies  Allergen Reactions   Keflex  [Cephalexin ] Nausea And Vomiting    Level of Care/Admitting Diagnosis ED Disposition     ED Disposition  Admit   Condition  --   Comment  Hospital Area: [facility] [[id number]]  Level of Care: Telemetry [5]  Covid Evaluation: Asymptomatic - no recent exposure (last 10 days) testing not required  Diagnosis: Acute on chronic respiratory failure [facility]) [[id number]]  Admitting Physician: EMOKPAE, EJIROGHENE E [[id number]]  Attending Physician: EMOKPAE, EJIROGHENE E [phone]  Certification:: I certify this patient will need inpatient services for at least 2 midnights  Expected Medical Readiness: 12/27/2023          B Medical/Surgery History Past Medical History:  Diagnosis Date   Adrenal adenoma, left    Stable   Anxiety    Arthritis    bilateral hands   COPD (chronic obstructive pulmonary disease) (HCC)    Depression    Diabetes mellitus, type 2 (HCC) 08/12/2008   Qualifier: Diagnosis of  By: Jenetta MD, Talia     Dyspnea    Esophageal varices (HCC)    Grade II diastolic dysfunction    History of kidney stones    Hyperlipidemia    Hypertension    Lower back pain    Lower GI bleed 03/19/2020   Panic attacks    Pneumonia    currently taking antibiotic and prednisone  for early stages of pneumonia   Pulmonary nodules    bilateral   Skin cancer    face   Past Surgical History:  Procedure Laterality Date   BIOPSY  04/07/2020   Procedure: BIOPSY;   Surgeon: Cindie Carlin POUR, DO;  [facility];  Service: Endoscopy;;   Breast Cystectomy  Right    CESAREAN SECTION     COLONOSCOPY WITH PROPOFOL  N/A 01/25/2020   Dr. Cindie: Nonbleeding internal hemorrhoids, diverticulosis, 5 mm polyp removed from the ascending colon, 10 mm polyp removed from the sigmoid colon, 30 mm polyp (tubulovillous adenoma with no high-grade dysplasia) removed from the transverse colon via piecemeal status post tattoo.  Other polyps were tubular adenomas.  3 month surveillance colonoscopy recommended.   COLONOSCOPY WITH PROPOFOL  N/A 04/07/2020   Procedure: COLONOSCOPY WITH PROPOFOL ;  Surgeon: Cindie Carlin POUR, DO;  [facility];  Service: Endoscopy;  Laterality: N/A;  3:00pm, pt knows new time per office   CYSTOSCOPY/URETEROSCOPY/HOLMIUM LASER/STENT PLACEMENT Bilateral 03/01/2019   Procedure: CYSTOSCOPY/RETROGRADEURETEROSCOPY/HOLMIUM LASER/STENT PLACEMENT;  Surgeon: Devere Lonni Righter, MD;  Location: [facility];  Service: Urology;  Laterality: Bilateral;  ONLY NEEDS 60 MIN   ESOPHAGOGASTRODUODENOSCOPY (EGD) WITH PROPOFOL  N/A 01/25/2020   Dr. Cindie: 4 columns grade 1 esophageal varices   ESOPHAGOGASTRODUODENOSCOPY (EGD) WITH PROPOFOL  N/A 05/18/2020   Procedure: ESOPHAGOGASTRODUODENOSCOPY (EGD) WITH PROPOFOL ;  Surgeon: Cindie Carlin POUR, DO;  [facility];  Service: Endoscopy;  Laterality: N/A;   ESOPHAGOGASTRODUODENOSCOPY (EGD) WITH PROPOFOL  N/A 07/28/2020   Procedure: ESOPHAGOGASTRODUODENOSCOPY (EGD) WITH PROPOFOL ;  Surgeon: Cindie Carlin POUR, DO;  [facility];  Service: Endoscopy;  Laterality: N/A;  am or early PM due to givens capsule placement   GIVENS CAPSULE STUDY N/A 05/18/2020   Procedure: GIVENS CAPSULE STUDY;  Surgeon: Eartha Angelia Sieving, MD;  [facility];  Service: Gastroenterology;  Laterality: N/A;   GIVENS CAPSULE STUDY N/A 07/28/2020   Procedure: GIVENS CAPSULE STUDY;  Surgeon: Cindie Carlin POUR, DO;   [facility];  Service: Endoscopy;  Laterality: N/A;   IR IMAGING GUIDED PORT INSERTION  12/02/2021   IRRIGATION AND DEBRIDEMENT ABSCESS Right 09/28/2022   Procedure: IRRIGATION AND DEBRIDEMENT ABSCESS;  Surgeon: Evonnie Dorothyann LABOR, DO;  Location: [facility];  Service: General;  Laterality: Right;   POLYPECTOMY  01/25/2020   Procedure: POLYPECTOMY;  Surgeon: Cindie Carlin POUR, DO;  [facility];  Service: Endoscopy;;   POLYPECTOMY  04/07/2020   Procedure: POLYPECTOMY INTESTINAL;  Surgeon: Cindie Carlin POUR, DO;  [facility];  Service: Endoscopy;;   PORT-A-CATH REMOVAL Right 09/28/2022   Procedure: MINOR REMOVAL PORT-A-CATH;  Surgeon: Evonnie Dorothyann LABOR, DO;  Location: [facility];  Service: General;  Laterality: Right;   PORTACATH PLACEMENT Right 04/12/2023   Procedure: INSERTION PORT-A-CATH, RIJ;  Surgeon: Evonnie Dorothyann LABOR, DO;  Location[facility];  Service: General;  Laterality: Right;   SKIN CANCER EXCISION     Face   SPINE SURGERY     SUBMUCOSAL TATTOO INJECTION  01/25/2020   Procedure: SUBMUCOSAL TATTOO INJECTION;  Surgeon: Cindie Carlin POUR, DO;  [facility];  Service: Endoscopy;;     A IV Location/Drains/Wounds Patient Lines/Drains/Airways Status     Active Line/Drains/Airways     Name Placement date Placement time Site Days   Implanted Port 07/04/23 07/04/23  0913  --  173   Ureteral Drain/Stent Right ureter 6 Fr. 03/01/19  1603  Right ureter  1759   Ureteral Drain/Stent Left ureter 6 Fr. 03/01/19  1624  Left ureter  1759            Intake/Output Last 24 hours No intake or output data in the 24 hours ending 12/24/23 2034  Labs/Imaging Results for orders placed or performed during the hospital encounter of 12/24/23 (from the past 48 hours)  Resp panel by RT-PCR (RSV, Flu A&B, Covid) Anterior Nasal Swab     Status: Abnormal   Collection Time: 12/24/23  4:48 PM   Specimen: Anterior Nasal Swab  Result Value Ref Range   SARS  Coronavirus 2 by RT PCR POSITIVE (A) NEGATIVE    Comment: (NOTE) SARS-CoV-2 target nucleic acids are DETECTED.  The SARS-CoV-2 RNA is generally detectable in upper respiratory specimens during the acute phase of infection. Positive results are indicative of the presence of the identified virus, but do not rule out bacterial infection or co-infection with other pathogens not detected by the test. Clinical correlation with patient history and other diagnostic information is necessary to determine patient infection status. The expected result is Negative.  Fact Sheet for Patients: BloggerCourse.com  Fact Sheet for Healthcare Providers: SeriousBroker.it  This test is not yet approved or cleared by the [place]  FDA and  has been authorized for detection and/or diagnosis of SARS-CoV-2 by FDA under an Emergency Use Authorization (EUA).  This EUA will remain in effect (meaning this test can be used) for the duration of  the COVID-19 declaration under Section 564(b)(1) of the A ct, 21 U.S.C. section 360bbb-3(b)(1), unless the authorization is terminated or revoked sooner.     Influenza A by PCR NEGATIVE  NEGATIVE   Influenza B by PCR NEGATIVE NEGATIVE    Comment: (NOTE) The Xpert Xpress SARS-CoV-2/FLU/RSV plus assay is intended as an aid in the diagnosis of influenza from Nasopharyngeal swab specimens and should not be used as a sole basis for treatment. Nasal washings and aspirates are unacceptable for Xpert Xpress SARS-CoV-2/FLU/RSV testing.  Fact Sheet for Patients: BloggerCourse.com  Fact Sheet for Healthcare Providers: SeriousBroker.it  This test is not yet approved or cleared by the [place]  FDA and has been authorized for detection and/or diagnosis of SARS-CoV-2 by FDA under an Emergency Use Authorization (EUA). This EUA will remain in effect (meaning this test can be  used) for the duration of the COVID-19 declaration under Section 564(b)(1) of the Act, 21 U.S.C. section 360bbb-3(b)(1), unless the authorization is terminated or revoked.     Resp Syncytial Virus by PCR NEGATIVE NEGATIVE    Comment: (NOTE) Fact Sheet for Patients: BloggerCourse.com  Fact Sheet for Healthcare Providers: SeriousBroker.it  This test is not yet approved or cleared by the [place]  FDA and has been authorized for detection and/or diagnosis of SARS-CoV-2 by FDA under an Emergency Use Authorization (EUA). This EUA will remain in effect (meaning this test can be used) for the duration of the COVID-19 declaration under Section 564(b)(1) of the Act, 21 U.S.C. section 360bbb-3(b)(1), unless the authorization is terminated or revoked.  Performed at [facility], [address], [place], [place] [address]   Comprehensive metabolic panel     Status: Abnormal   Collection Time: 12/24/23  5:13 PM  Result Value Ref Range   Sodium 141 135 - 145 mmol/L   Potassium 3.6 3.5 - 5.1 mmol/L   Chloride 102 98 - 111 mmol/L   CO2 31 22 - 32 mmol/L   Glucose, Bld 149 (H) 70 - 99 mg/dL    Comment: Glucose reference range applies only to samples taken after fasting for at least 8 hours.   BUN 15 8 - 23 mg/dL   Creatinine, Ser 9.43 0.44 - 1.00 mg/dL   Calcium  8.1 (L) 8.9 - 10.3 mg/dL   Total Protein 5.0 (L) 6.5 - 8.1 g/dL   Albumin 2.8 (L) 3.5 - 5.0 g/dL   AST 16 15 - 41 U/L   ALT 11 0 - 44 U/L   Alkaline Phosphatase 76 38 - 126 U/L   Total Bilirubin 0.6 0.0 - 1.2 mg/dL   GFR, Estimated >39 >39 mL/min    Comment: (NOTE) Calculated using the CKD-EPI Creatinine Equation (2021)    Anion gap 8 5 - 15    Comment: Performed at [facility], [address] [place], [place] [address]  Lactic acid, plasma     Status: None   Collection Time: 12/24/23  5:13 PM  Result Value Ref Range   Lactic Acid, Venous 1.2 0.5 - 1.9 mmol/L    Comment:  Performed at [facility], [address], [place], [place] [address]  CBC with Differential     Status: Abnormal   Collection Time: 12/24/23  5:13 PM  Result Value Ref Range   WBC 3.9 (L) 4.0 - 10.5 K/uL   RBC 2.05 (L) 3.87 - 5.11 MIL/uL   Hemoglobin 6.0 (LL) 12.0 - 15.0 g/dL    Comment: REPEATED TO VERIFY This critical result has been called to KELLY P. by Ulla Echevaria on 12/24/2023 17:50:00, and has been read back.    HCT 21.2 (L) 36.0 - 46.0 %   MCV 103.4 (H) 80.0 - 100.0 fL  MCH 29.3 26.0 - 34.0 pg   MCHC 28.3 (L) 30.0 - 36.0 g/dL   RDW 82.4 (H) 88.4 - 84.4 %   Platelets 92 (L) 150 - 400 K/uL    Comment: SPECIMEN CHECKED FOR CLOTS Immature Platelet Fraction may be clinically indicated, consider ordering this additional test [id number]    nRBC 0.0 0.0 - 0.2 %   Neutrophils Relative % 66 %   Neutro Abs 2.6 1.7 - 7.7 K/uL   Lymphocytes Relative 24 %   Lymphs Abs 0.9 0.7 - 4.0 K/uL   Monocytes Relative 8 %   Monocytes Absolute 0.3 0.1 - 1.0 K/uL   Eosinophils Relative 1 %   Eosinophils Absolute 0.0 0.0 - 0.5 K/uL   Basophils Relative 0 %   Basophils Absolute 0.0 0.0 - 0.1 K/uL   WBC Morphology MORPHOLOGY UNREMARKABLE    RBC Morphology MORPHOLOGY UNREMARKABLE    Immature Granulocytes 1 %   Abs Immature Granulocytes 0.03 0.00 - 0.07 K/uL    Comment: Performed at [facility], [address] [place], [place] [address]  Protime-INR     Status: None   Collection Time: 12/24/23  5:13 PM  Result Value Ref Range   Prothrombin Time 13.7 11.4 - 15.2 seconds   INR 1.0 0.8 - 1.2    Comment: (NOTE) INR goal varies based on device and disease states. Performed at [facility], [address], [place], [place] [address]   Culture, blood (Routine x 2)     Status: None (Preliminary result)   Collection Time: 12/24/23  5:13 PM   Specimen: Porta Cath; Blood  Result Value Ref Range   Specimen Description PORTA CATH BOTTLES DRAWN AEROBIC AND ANAEROBIC    Special Requests      Blood  Culture adequate volume Performed at [facility], [address], [place], [place] [address]    Culture PENDING    Report Status PENDING   Brain natriuretic peptide     Status: None   Collection Time: 12/24/23  5:13 PM  Result Value Ref Range   B Natriuretic Peptide 55.0 0.0 - 100.0 pg/mL    Comment: Performed at [facility], [address] [place], [place] [address]  Lactic acid, plasma     Status: None   Collection Time: 12/24/23  6:12 PM  Result Value Ref Range   Lactic Acid, Venous 1.3 0.5 - 1.9 mmol/L    Comment: Performed at [facility], [address], [place], [place] [address]  Type and screen [facility]     Status: None (Preliminary result)   Collection Time: 12/24/23  7:24 PM  Result Value Ref Range   ABO/RH(D) A POS    Antibody Screen PENDING    Sample Expiration      12/27/2023,2359 Performed at [facility], [address], [place], [place] [address]   Prepare RBC (crossmatch)     Status: None   Collection Time: 12/24/23  7:30 PM  Result Value Ref Range   Order Confirmation      ORDER PROCESSED BY BLOOD BANK Performed at [facility], [address], [place], [place] [address]   POC occult blood, ED     Status: None   Collection Time: 12/24/23  7:32 PM  Result Value Ref Range   Positive     *Note: Due to a large number of results and/or encounters for the requested time period, some results have not been displayed. A complete set of results can be found in Results Review.  DG Chest Portable 1 View Result Date: 12/24/2023 CLINICAL DATA:  Cough and fever. EXAM: PORTABLE CHEST 1 VIEW COMPARISON:  November 13, 2023 FINDINGS: There is stable right-sided venous Port-A-Cath positioning. The cardiac silhouette is enlarged and unchanged in size. There is marked severity calcification of the aortic arch. Prominence of the central pulmonary vasculature is seen. Mild, diffusely increased interstitial lung markings are noted. No pleural effusion or pneumothorax is  identified. Multilevel degenerative changes are seen throughout the thoracic spine. IMPRESSION: Cardiomegaly with mild pulmonary vascular congestion. Electronically Signed   By: Suzen Dials M.D.   On: 12/24/2023 17:14    Pending Labs Unresulted Labs (From admission, onward)     Start     Ordered   12/24/23 1604  Culture, blood (Routine x 2)  BLOOD CULTURE X 2,   R (with STAT occurrences)      12/24/23 1603            Vitals/Pain Today's Vitals   12/24/23 1811 12/24/23 1815 12/24/23 1831 12/24/23 1945  BP: 123/84 (!) 117/47 132/75 (!) 143/52  Pulse: (!) 103 98 (!) 103 100  Resp: (!) 27 20    Temp:      TempSrc:      SpO2: 100% 100% 100% 98%  Weight:      Height:      PainSc:        Isolation Precautions No active isolations  Medications Medications  0.9 %  sodium chloride  infusion (Manually program via Guardrails IV Fluids) (0 mLs Intravenous Hold 12/24/23 2011)  cefTRIAXone  (ROCEPHIN ) 1 g in sodium chloride  0.9 % 100 mL IVPB (1 g Intravenous New Bag/Given 12/24/23 2017)  octreotide  (SANDOSTATIN ) 2 mcg/mL load via infusion 50 mcg (has no administration in time range)    And  octreotide  (SANDOSTATIN ) 500 mcg in sodium chloride  0.9 % 250 mL (2 mcg/mL) infusion (has no administration in time range)  furosemide  (LASIX ) injection 40 mg (40 mg Intravenous Given 12/24/23 1805)  pantoprazole  (PROTONIX ) injection 80 mg (80 mg Intravenous Given 12/24/23 1952)    Mobility non-ambulatory     Focused Assessments    R Recommendations: See Admitting Provider Note  Report given to: Corean   Additional Notes: [place] 3 lpm baseline

## 2023-12-24 NOTE — ED Provider Notes (Signed)
 [place] [facility]ENC[facility] DEPARTMENT AT [name] Provider Note  CSN: [id number] Arrival date & time: 12/24/23 1532  Chief Complaint(s) Cough  HPI [place]  ATIANNA Burns is a 73 y.o. female C OPD, diabetes, CHF, hypertension, hyperlipidemia, multiple myeloma, cirrhosis presenting to the emergency department with shortness of breath.  Patient reports shortness of breath for the past 2 days, associated with cough which is nonproductive.  Reports associated lightheadedness and generalized weakness.  Reports started with sore throat and runny nose.  Her son-in-law was around her and he had COVID.  No chest pain, abdominal pain   Past Medical History Past Medical History:  Diagnosis Date   Adrenal adenoma, left    Stable   Anxiety    Arthritis    bilateral hands   COPD (chronic obstructive pulmonary disease) (HCC)    Depression    Diabetes mellitus, type 2 (HCC) 08/12/2008   Qualifier: Diagnosis of  By: Jenetta MD, Talia     Dyspnea    Esophageal varices (HCC)    Grade II diastolic dysfunction    History of kidney stones    Hyperlipidemia    Hypertension    Lower back pain    Lower GI bleed 03/19/2020   Panic attacks    Pneumonia    currently taking antibiotic and prednisone  for early stages of pneumonia   Pulmonary nodules    bilateral   Skin cancer    face   Patient Active Problem List   Diagnosis Date Noted   Acute on chronic respiratory failure (HCC) 12/24/2023   Vitamin B12 deficiency 11/21/2023   Esophageal varices (HCC) 10/06/2023   Adrenal mass 10/06/2023   Acute on chronic respiratory failure with hypoxia (HCC) 06/09/2023   Long term (current) use of antibiotics 10/25/2022   Gastroesophageal reflux disease without esophagitis 10/03/2022   Port or reservoir infection 09/28/2022   Axillary abscess 09/28/2022   Staphylococcus aureus bacteremia 09/27/2022   MSSA bacteremia 09/27/2022   Obesity, Class III, BMI 40-49.9 (morbid obesity) 09/26/2022   Chronic  respiratory failure with hypoxia (HCC) 09/26/2022   Thrombocytopenia 09/26/2022   Cellulitis of axilla, right 09/26/2022   Diabetic peripheral neuropathy associated with type 2 diabetes mellitus (HCC) 02/25/2022   Multiple myeloma (HCC) 11/30/2021   H. pylori infection 08/12/2021   Cirrhosis of liver (HCC) 08/04/2021   Hypoalbuminemia 05/17/2020   Moderate protein malnutrition 05/17/2020   Melena 05/16/2020   Hyperlipidemia    Hypokalemia    Lower GI bleed 03/19/2020   Acute on chronic congestive heart failure (HCC)    Symptomatic anemia    Iron  deficiency anemia    Heme positive stool    ABLA (acute blood loss anemia) 01/22/2020   Supplemental oxygen  dependent 10/22/2019   DOE (dyspnea on exertion) 10/22/2019   Hypertension associated with diabetes (HCC) 10/22/2019   Bilateral hand pain 09/21/2016   Fatigue 07/30/2015   Chronic diastolic HF (heart failure) (HCC) 06/29/2015   Postmenopausal bleeding 04/17/2015   Excessive daytime sleepiness 12/19/2014   Swelling of lower extremity 11/27/2014   Back pain 06/13/2013   Sinusitis, chronic 05/30/2012   Allergic rhinitis 06/06/2011   Hyperlipidemia associated with type 2 diabetes mellitus (HCC) 03/24/2009   Diabetes mellitus, type 2 (HCC) 08/12/2008   Pulmonary nodule 08/29/2007   COPD (chronic obstructive pulmonary disease) (HCC) 02/14/2007   Class 3 obesity 05/25/2006   Depression, recurrent 05/25/2006   HYPERTENSION, BENIGN SYSTEMIC 05/25/2006   DJD, UNSPECIFIED 05/25/2006   Home Medication(s) Prior to Admission medications   Medication Sig  Start Date End Date Taking? Authorizing Provider  acetaminophen  (TYLENOL ) 325 MG tablet Take 2 tablets (650 mg total) by mouth every 6 (six) hours as needed for mild pain (pain score 1-3) (or Fever >/= 101). 06/19/23   Emokpae, Courage, MD  acyclovir  (ZOVIRAX ) 400 MG tablet Take 1 tablet (400 mg total) by mouth 2 (two) times daily. 11/21/23   Davonna Siad, MD  albuterol  (PROVENTIL ) (2.5  MG/3ML) 0.083% nebulizer solution Take 3 mLs (2.5 mg total) by nebulization every 4 (four) hours as needed for wheezing or shortness of breath. 06/19/23   Pearlean Manus, MD  albuterol  (VENTOLIN  HFA) 108 (90 Base) MCG/ACT inhaler Inhale 2 puffs into the lungs every 6 (six) hours as needed for wheezing or shortness of breath. 06/19/23   Pearlean Manus, MD  bumetanide  (BUMEX ) 1 MG tablet Take 2 tablets (2 mg total) by mouth daily. 08/01/23   Rogers Hai, MD  carvedilol  (COREG ) 6.25 MG tablet Take 1 tablet (6.25 mg total) by mouth 2 (two) times daily with a meal. 06/19/23 11/28/23  Pearlean Manus, MD  desvenlafaxine  (PRISTIQ ) 100 MG 24 hr tablet Take 100 mg by mouth in the morning. 01/26/23   [provider]  dexamethasone  (DECADRON ) 4 MG tablet Take 5 tablets once every 4 weeks prior to darzalex  10/24/23   Rogers Hai, MD  esomeprazole  (NEXIUM ) 40 MG capsule Take 1 capsule (40 mg total) by mouth 2 (two) times daily before a meal. 10/05/23   Eartha Flavors, Toribio, MD  ferrous sulfate  325 (65 FE) MG tablet Take 1 tablet (325 mg total) by mouth daily with breakfast. 06/12/23 11/28/23  Shahmehdi, Adriana LABOR, MD  fluticasone -salmeterol (ADVAIR) 100-50 MCG/ACT AEPB Inhale 1 puff into the lungs 2 (two) times daily. 06/19/23   Pearlean Manus, MD  furosemide  (LASIX ) 40 MG tablet Take 1 tablet (40 mg total) by mouth daily. 06/19/23   Pearlean Manus, MD  lidocaine -prilocaine  (EMLA ) cream Apply 1 Application topically as needed. Apply a small amount to port a cath site and cover with plastic wrap 1 hour prior to infusion appointments 04/12/23   Pappayliou, Dorothyann A, DO  lovastatin  (MEVACOR ) 20 MG tablet Take 1 tablet (20 mg total) by mouth at bedtime. 06/12/23   Shahmehdi, Adriana LABOR, MD  methocarbamol  (ROBAXIN ) 750 MG tablet Take 1 tablet (750 mg total) by mouth 3 (three) times daily. 06/19/23   Pearlean Manus, MD  metolazone  (ZAROXOLYN ) 2.5 MG tablet Take 1 tablet (2.5 mg total) by mouth  daily as needed. 08/01/23   Rogers Hai, MD  nystatin  (MYCOSTATIN /NYSTOP ) powder Apply topically 2 (two) times daily. 01/30/23   Dettinger, Fonda LABOR, MD  oxyCODONE  (OXY IR/ROXICODONE ) 5 MG immediate release tablet Take 1 tablet (5 mg total) by mouth every 8 (eight) hours as needed for severe pain (pain score 7-10). 06/19/23   Pearlean Manus, MD  oxyCODONE -acetaminophen  (PERCOCET) 10-325 MG tablet Take 1 tablet by mouth 4 (four) times daily as needed. 10/13/23   [provider]  OXYGEN  Inhale 3 L into the lungs continuous.    [provider]  OZEMPIC , 0.25 OR 0.5 MG/DOSE, 2 MG/3ML SOPN Inject 0.25 mg into the skin every 7 (seven) days. 12/02/22   Jolinda Norene HERO, DO  potassium chloride  SA (KLOR-CON  M) 20 MEQ tablet Take 1 tablet (20 mEq total) by mouth 2 (two) times daily. 07/04/23 11/28/23  Rogers Hai, MD  Past Surgical History Past Surgical History:  Procedure Laterality Date   BIOPSY  04/07/2020   Procedure: BIOPSY;  Surgeon: Cindie Carlin POUR, DO;  Location: [facility];  Service: Endoscopy;;   Breast Cystectomy  Right    CESAREAN SECTION     COLONOSCOPY WITH PROPOFOL  N/A 01/25/2020   Dr. Cindie: Nonbleeding internal hemorrhoids, diverticulosis, 5 mm polyp removed from the ascending colon, 10 mm polyp removed from the sigmoid colon, 30 mm polyp (tubulovillous adenoma with no high-grade dysplasia) removed from the transverse colon via piecemeal status post tattoo.  Other polyps were tubular adenomas.  3 month surveillance colonoscopy recommended.   COLONOSCOPY WITH PROPOFOL  N/A 04/07/2020   Procedure: COLONOSCOPY WITH PROPOFOL ;  Surgeon: Cindie Carlin POUR, DO;  Location: [facility];  Service: Endoscopy;  Laterality: N/A;  3:00pm, pt knows new time per office   CYSTOSCOPY/URETEROSCOPY/HOLMIUM LASER/STENT PLACEMENT Bilateral 03/01/2019    Procedure: CYSTOSCOPY/RETROGRADEURETEROSCOPY/HOLMIUM LASER/STENT PLACEMENT;  Surgeon: Devere Lonni Righter, MD;  Location: [facility];  Service: Urology;  Laterality: Bilateral;  ONLY NEEDS 60 MIN   ESOPHAGOGASTRODUODENOSCOPY (EGD) WITH PROPOFOL  N/A 01/25/2020   Dr. Cindie: 4 columns grade 1 esophageal varices   ESOPHAGOGASTRODUODENOSCOPY (EGD) WITH PROPOFOL  N/A 05/18/2020   Procedure: ESOPHAGOGASTRODUODENOSCOPY (EGD) WITH PROPOFOL ;  Surgeon: Cindie Carlin POUR, DO;  Location: [facility];  Service: Endoscopy;  Laterality: N/A;   ESOPHAGOGASTRODUODENOSCOPY (EGD) WITH PROPOFOL  N/A 07/28/2020   Procedure: ESOPHAGOGASTRODUODENOSCOPY (EGD) WITH PROPOFOL ;  Surgeon: Cindie Carlin POUR, DO;  [facility]: [facility];  Service: Endoscopy;  Laterality: N/A;  am or early PM due to givens capsule placement   GIVENS CAPSULE STUDY N/A 05/18/2020   Procedure: GIVENS CAPSULE STUDY;  Surgeon: Eartha Angelia Sieving, MD;  Location: [facility];  Service: Gastroenterology;  Laterality: N/A;   GIVENS CAPSULE STUDY N/A 07/28/2020   Procedure: GIVENS CAPSULE STUDY;  Surgeon: Cindie Carlin POUR, DO;  [facility]: [facility];  Service: Endoscopy;  Laterality: N/A;   IR IMAGING GUIDED PORT INSERTION  12/02/2021   IRRIGATION AND DEBRIDEMENT ABSCESS Right 09/28/2022   Procedure: IRRIGATION AND DEBRIDEMENT ABSCESS;  Surgeon: Evonnie Dorothyann LABOR, DO;  Location: [facility];  Service: General;  Laterality: Right;   POLYPECTOMY  01/25/2020   Procedure: POLYPECTOMY;  Surgeon: Cindie Carlin POUR, DO;  [facility];  Service: Endoscopy;;   POLYPECTOMY  04/07/2020   Procedure: POLYPECTOMY INTESTINAL;  Surgeon: Cindie Carlin POUR, DO;  [facility];  Service: Endoscopy;;   PORT-A-CATH REMOVAL Right 09/28/2022   Procedure: MINOR REMOVAL PORT-A-CATH;  Surgeon: Evonnie Dorothyann LABOR, DO;  Location: [facility];  Service: General;  Laterality: Right;   PORTACATH PLACEMENT Right 04/12/2023   Procedure: INSERTION PORT-A-CATH,  RIJ;  Surgeon: Evonnie Dorothyann LABOR, DO;  Location: [facility];  Service: General;  Laterality: Right;   SKIN CANCER EXCISION     Face   SPINE SURGERY     SUBMUCOSAL TATTOO INJECTION  01/25/2020   Procedure: SUBMUCOSAL TATTOO INJECTION;  Surgeon: Cindie Carlin POUR, DO;  [facility];  Service: Endoscopy;;   Family History Family History  Problem Relation Age of Onset   Diabetes Father    Heart disease Father 15       MI   Hypertension Father    Anemia Mother        Transfusion dependent   COPD Sister    Cancer Paternal Grandmother 23       Pancreatic    Social History Social History   Tobacco Use   Smoking  status: Former    Current packs/day: 0.00    Average packs/day: 1.5 packs/day for 40.0 years (60.0 ttl pk-yrs)    Types: Cigarettes    Start date: 04/29/1975    Quit date: 04/29/2015    Years since quitting: 8.6   Smokeless tobacco: Never   Tobacco comments:    Quit smoking 04/2015- Previous 1.5 ppd smoker  Vaping Use   Vaping status: Never Used  Substance Use Topics   Alcohol  use: No    Alcohol /week: 0.0 standard drinks of alcohol    Drug use: No   Allergies Keflex  [cephalexin ]  Review of Systems Review of Systems  All other systems reviewed and are negative.   Physical Exam Vital Signs  I have reviewed the triage vital signs BP 132/75   Pulse (!) 103   Temp 100 F (37.8 C) (Oral)   Resp 20   Ht 5' 2 (1.575 m)   Wt 132.9 kg   SpO2 100%   BMI 53.59 kg/m  Physical Exam Vitals and nursing note reviewed.  Constitutional:      General: She is not in acute distress.    Appearance: She is well-developed.  HENT:     Head: Normocephalic and atraumatic.     Mouth/Throat:     Mouth: Mucous membranes are moist.  Eyes:     Pupils: Pupils are equal, round, and reactive to light.  Cardiovascular:     Rate and Rhythm: Normal rate and regular rhythm.     Heart sounds: No murmur heard. Pulmonary:     Effort: Pulmonary effort is normal. Tachypnea  present. No respiratory distress.     Breath sounds: Rales (bibasilar) present. No wheezing.  Abdominal:     General: Abdomen is flat.     Palpations: Abdomen is soft.     Tenderness: There is no abdominal tenderness.  Musculoskeletal:        General: No tenderness.     Right lower leg: Edema present.     Left lower leg: Edema present.     Comments: Bilateral chronic lymphedema type changes  Skin:    General: Skin is warm and dry.  Neurological:     General: No focal deficit present.     Mental Status: She is alert. Mental status is at baseline.  Psychiatric:        Mood and Affect: Mood normal.        Behavior: Behavior normal.     ED Results and Treatments Labs (all labs ordered are listed, but only abnormal results are displayed) Labs Reviewed  RESP PANEL BY RT-PCR (RSV, FLU A&B, COVID)  RVPGX2 - Abnormal; Notable for the following components:      Result Value   SARS Coronavirus 2 by RT PCR POSITIVE (*)    All other components within normal limits  COMPREHENSIVE METABOLIC PANEL WITH GFR - Abnormal; Notable for the following components:   Glucose, Bld 149 (*)    Calcium  8.1 (*)    Total Protein 5.0 (*)    Albumin 2.8 (*)    All other components within normal limits  CBC WITH DIFFERENTIAL/PLATELET - Abnormal; Notable for the following components:   WBC 3.9 (*)    RBC 2.05 (*)    Hemoglobin 6.0 (*)    HCT 21.2 (*)    MCV 103.4 (*)    MCHC 28.3 (*)    RDW 17.5 (*)    Platelets 92 (*)    All other components within normal limits  CULTURE, BLOOD (ROUTINE  X 2)  CULTURE, BLOOD (ROUTINE X 2)  LACTIC ACID, PLASMA  LACTIC ACID, PLASMA  PROTIME-INR  BRAIN NATRIURETIC PEPTIDE  POC OCCULT BLOOD, ED  TYPE AND SCREEN  PREPARE RBC (CROSSMATCH)                                                                                                                          Radiology DG Chest Portable 1 View Result Date: 12/24/2023 CLINICAL DATA:  Cough and fever. EXAM: PORTABLE  CHEST 1 VIEW COMPARISON:  November 13, 2023 FINDINGS: There is stable right-sided venous Port-A-Cath positioning. The cardiac silhouette is enlarged and unchanged in size. There is marked severity calcification of the aortic arch. Prominence of the central pulmonary vasculature is seen. Mild, diffusely increased interstitial lung markings are noted. No pleural effusion or pneumothorax is identified. Multilevel degenerative changes are seen throughout the thoracic spine. IMPRESSION: Cardiomegaly with mild pulmonary vascular congestion. Electronically Signed   By: Suzen Dials M.D.   On: 12/24/2023 17:14    Pertinent labs & imaging results that were available during my care of the patient were reviewed by me and considered in my medical decision making (see MDM for details).  Medications Ordered in ED Medications  0.9 %  sodium chloride  infusion (Manually program via Guardrails IV Fluids) (has no administration in time range)  cefTRIAXone  (ROCEPHIN ) 1 g in sodium chloride  0.9 % 100 mL IVPB (has no administration in time range)  octreotide  (SANDOSTATIN ) 2 mcg/mL load via infusion 50 mcg (has no administration in time range)    And  octreotide  (SANDOSTATIN ) 500 mcg in sodium chloride  0.9 % 250 mL (2 mcg/mL) infusion (has no administration in time range)  furosemide  (LASIX ) injection 40 mg (40 mg Intravenous Given 12/24/23 1805)  pantoprazole  (PROTONIX ) injection 80 mg (80 mg Intravenous Given 12/24/23 1952)                                                                                                                                     Procedures .Critical Care  Performed by: Francesca Elsie CROME, MD Authorized by: Francesca Elsie CROME, MD   Critical care provider statement:    Critical care time (minutes):  30   Critical care was necessary to treat or prevent imminent or life-threatening deterioration of the following conditions:  Circulatory failure and respiratory failure   Critical care was  time spent personally by me  on the following activities:  Development of treatment plan with patient or surrogate, discussions with consultants, evaluation of patient's response to treatment, examination of patient, ordering and review of laboratory studies, ordering and review of radiographic studies, ordering and performing treatments and interventions, pulse oximetry, re-evaluation of patient's condition and review of old charts   Care discussed with: admitting provider     (including critical care time)  Medical Decision Making / ED Course   MDM:  73 year old presenting with weakness, shortness of breath.  Patient no acute distress, mildly dyspneic.  Lower extremities are edematous.  Physical exam with bibasilar crackles.  Differential includes COVID/flu, CHF, pneumonia, COPD, pneumothorax, renal failure.  Will check labs, chest x-ray.  Borderline fever, will check cultures.  Reassess.  Likely will need admission given hypoxia on home oxygen .  Patient is on chronic 3 L but was 81% on 3 L.  She is improved 100% on 4 L.  Clinical Course as of 12/24/23 2000  Sun Dec 24, 2023  1958 Workup notable for anemia, COVID-positive.  Patient received Lasix  and has had a lot of urination and feels better.  Her BNP is normal but she is also quite obese so it might be falsely lower.  No leukocytosis.  Rectal exam positive for melanotic stool.  Discussed with GI Dr. Shaaron who recommends transfusion, PPI, octreotide  and ceftriaxone .  Discussed with hospitalist Dr. Pearlean who will admit patient for further care. [WS]    Clinical Course User Index [WS] Francesca Elsie CROME, MD     Additional history obtained: -Additional history obtained from family -External records from outside source obtained and reviewed including: Chart review including previous notes, labs, imaging, consultation notes including prior notes    Lab Tests: -I ordered, reviewed, and interpreted labs.   The pertinent results  include:   Labs Reviewed  RESP PANEL BY RT-PCR (RSV, FLU A&B, COVID)  RVPGX2 - Abnormal; Notable for the following components:      Result Value   SARS Coronavirus 2 by RT PCR POSITIVE (*)    All other components within normal limits  COMPREHENSIVE METABOLIC PANEL WITH GFR - Abnormal; Notable for the following components:   Glucose, Bld 149 (*)    Calcium  8.1 (*)    Total Protein 5.0 (*)    Albumin 2.8 (*)    All other components within normal limits  CBC WITH DIFFERENTIAL/PLATELET - Abnormal; Notable for the following components:   WBC 3.9 (*)    RBC 2.05 (*)    Hemoglobin 6.0 (*)    HCT 21.2 (*)    MCV 103.4 (*)    MCHC 28.3 (*)    RDW 17.5 (*)    Platelets 92 (*)    All other components within normal limits  CULTURE, BLOOD (ROUTINE X 2)  CULTURE, BLOOD (ROUTINE X 2)  LACTIC ACID, PLASMA  LACTIC ACID, PLASMA  PROTIME-INR  BRAIN NATRIURETIC PEPTIDE  POC OCCULT BLOOD, ED  TYPE AND SCREEN  PREPARE RBC (CROSSMATCH)    Notable for see mDM   EKG   EKG Interpretation Date/Time:  Sunday December 24 2023 16:12:08 EDT Ventricular Rate:  106 PR Interval:  134 QRS Duration:  137 QT Interval:  360 QTC Calculation: 478 R Axis:   -8  Text Interpretation: Sinus tachycardia Right bundle branch block Inferior infarct, old Confirmed by Francesca Elsie ([id number]) on 12/24/2023 5:24:40 PM         Imaging Studies ordered: I ordered imaging studies including CXR On my interpretation imaging demonstrates  possible pulm edema  I independently visualized and interpreted imaging. I agree with the radiologist interpretation   Medicines ordered and prescription drug management: Meds ordered this encounter  Medications   furosemide  (LASIX ) injection 40 mg   0.9 %  sodium chloride  infusion (Manually program via Guardrails IV Fluids)   pantoprazole  (PROTONIX ) injection 80 mg   cefTRIAXone  (ROCEPHIN ) 1 g in sodium chloride  0.9 % 100 mL IVPB    Antibiotic Indication::   UTI   AND  Linked Order Group    octreotide  (SANDOSTATIN ) 2 mcg/mL load via infusion 50 mcg    octreotide  (SANDOSTATIN ) 500 mcg in sodium chloride  0.9 % 250 mL (2 mcg/mL) infusion    -I have reviewed the patients home medicines and have made adjustments as needed   Consultations Obtained: I requested consultation with the GI,  and discussed lab and imaging findings as well as pertinent plan - they recommend: admission   Cardiac Monitoring: The patient was maintained on a cardiac monitor.  I personally viewed and interpreted the cardiac monitored which showed an underlying rhythm of: sinus rhythm   Social Determinants of Health:  Diagnosis or treatment significantly limited by social determinants of health: obesity   Reevaluation: After the interventions noted above, I reevaluated the patient and found that their symptoms have improved  Co morbidities that complicate the patient evaluation  Past Medical History:  Diagnosis Date   Adrenal adenoma, left    Stable   Anxiety    Arthritis    bilateral hands   COPD (chronic obstructive pulmonary disease) (HCC)    Depression    Diabetes mellitus, type 2 (HCC) 08/12/2008   Qualifier: Diagnosis of  By: Jenetta MD, Talia     Dyspnea    Esophageal varices (HCC)    Grade II diastolic dysfunction    History of kidney stones    Hyperlipidemia    Hypertension    Lower back pain    Lower GI bleed 03/19/2020   Panic attacks    Pneumonia    currently taking antibiotic and prednisone  for early stages of pneumonia   Pulmonary nodules    bilateral   Skin cancer    face      Dispostion: Disposition decision including need for hospitalization was considered, and patient admitted to the hospital.    Final Clinical Impression(s) / ED Diagnoses Final diagnoses:  Acute hypoxic respiratory failure (HCC)  UGIB (upper gastrointestinal bleed)  Symptomatic anemia  COVID-19     This chart was dictated using voice recognition software.  Despite best  efforts to proofread,  errors can occur which can change the documentation meaning.    Francesca Elsie CROME, MD 12/24/23 [place]

## 2023-12-24 NOTE — ED Notes (Signed)
 Accessed port at this time with no difficulties.

## 2023-12-24 NOTE — ED Notes (Signed)
  at bedside

## 2023-12-24 NOTE — ED Notes (Signed)
 Patient given a sand which to eat and a drink.

## 2023-12-24 NOTE — H&P (Signed)
 History and Physical    Savannah  SHATERIA Burns FMW:[id number] DOB: [birth year] DOA: 12/24/2023  PCP: Jolinda Norene HERO, DO   Patient coming from: Home  I have personally briefly reviewed patient's old medical records in [facility]  Chief Complaint: Difficulty breathing  HPI: Savannah Burns is a 73 y.o. female with medical history significant for CHF, COPD, chronic respiratory failure on 3 L, hypertension, multiple myeloma, GI bleed. Patient presented to the ED with complaints of 2 days of difficulty breathing and dry cough.  She also reports nausea and dizziness.  No chest pain.  She reports her baseline weight is about 398.  She does not check her weight regularly. She reports bilateral lower extremity swelling over the past several days. She has not been compliant with her Lasix , takes it on average 4 times a week, also on metolazone .  Patient had contact with her son-in-law who had COVID.  Over the past 2 weeks she has had black stools.  No gross blood in stools, no vomiting.  No abdominal pain.  She denies NSAID use.  ED Course: Temperature 100.  Heart rate 98-113.  Respiratory rate 20-27.  Blood pressure systolic 104-154.  O2 sats 91% on prior to admission on 3 L of O2.  O2 sats greater than 98% on 4 L. Lactic acid 1.2. BNP 55. Hemoglobin 6. X-ray-cardiomegaly with mild pulmonary vascular congestion. IV Lasix  40 mg x 1 given. Protonix  80 mg x 1 given. EDP talked to GI-follow, will see in consult, recommended starting ceftriaxone  and octreotide .  Review of Systems: As per HPI all other systems reviewed and negative.  Past Medical History:  Diagnosis Date   Adrenal adenoma, left    Stable   Anxiety    Arthritis    bilateral hands   COPD (chronic obstructive pulmonary disease) (HCC)    Depression    Diabetes mellitus, type 2 (HCC) 08/12/2008   Qualifier: Diagnosis of  By: Jenetta MD, Talia     Dyspnea    Esophageal varices (HCC)    Grade II diastolic dysfunction     History of kidney stones    Hyperlipidemia    Hypertension    Lower back pain    Lower GI bleed 03/19/2020   Panic attacks    Pneumonia    currently taking antibiotic and prednisone  for early stages of pneumonia   Pulmonary nodules    bilateral   Skin cancer    face    Past Surgical History:  Procedure Laterality Date   BIOPSY  04/07/2020   Procedure: BIOPSY;  Surgeon: Cindie Carlin POUR, DO;  [facility];  Service: Endoscopy;;   Breast Cystectomy  Right    CESAREAN SECTION     COLONOSCOPY WITH PROPOFOL  N/A 01/25/2020   Dr. Cindie: Nonbleeding internal hemorrhoids, diverticulosis, 5 mm polyp removed from the ascending colon, 10 mm polyp removed from the sigmoid colon, 30 mm polyp (tubulovillous adenoma with no high-grade dysplasia) removed from the transverse colon via piecemeal status post tattoo.  Other polyps were tubular adenomas.  3 month surveillance colonoscopy recommended.   COLONOSCOPY WITH PROPOFOL  N/A 04/07/2020   Procedure: COLONOSCOPY WITH PROPOFOL ;  Surgeon: Cindie Carlin POUR, DO;  [facility]: [facility];  Service: Endoscopy;  Laterality: N/A;  3:00pm, pt knows new time per office   CYSTOSCOPY/URETEROSCOPY/HOLMIUM LASER/STENT PLACEMENT Bilateral 03/01/2019   Procedure: CYSTOSCOPY/RETROGRADEURETEROSCOPY/HOLMIUM LASER/STENT PLACEMENT;  Surgeon: Devere Lonni Righter, MD;  Location: [facility];  Service: Urology;  Laterality: Bilateral;  ONLY NEEDS 60 MIN  ESOPHAGOGASTRODUODENOSCOPY (EGD) WITH PROPOFOL  N/A 01/25/2020   Dr. Cindie: 4 columns grade 1 esophageal varices   ESOPHAGOGASTRODUODENOSCOPY (EGD) WITH PROPOFOL  N/A 05/18/2020   Procedure: ESOPHAGOGASTRODUODENOSCOPY (EGD) WITH PROPOFOL ;  Surgeon: Cindie Carlin POUR, DO;  [facility];  Service: Endoscopy;  Laterality: N/A;   ESOPHAGOGASTRODUODENOSCOPY (EGD) WITH PROPOFOL  N/A 07/28/2020   Procedure: ESOPHAGOGASTRODUODENOSCOPY (EGD) WITH PROPOFOL ;  Surgeon: Cindie Carlin POUR, DO;  [facility];   Service: Endoscopy;  Laterality: N/A;  am or early PM due to givens capsule placement   GIVENS CAPSULE STUDY N/A 05/18/2020   Procedure: GIVENS CAPSULE STUDY;  Surgeon: Eartha Angelia Sieving, MD;  [facility];  Service: Gastroenterology;  Laterality: N/A;   GIVENS CAPSULE STUDY N/A 07/28/2020   Procedure: GIVENS CAPSULE STUDY;  Surgeon: Cindie Carlin POUR, DO;  [facility];  Service: Endoscopy;  Laterality: N/A;   IR IMAGING GUIDED PORT INSERTION  12/02/2021   IRRIGATION AND DEBRIDEMENT ABSCESS Right 09/28/2022   Procedure: IRRIGATION AND DEBRIDEMENT ABSCESS;  Surgeon: Evonnie Dorothyann LABOR, DO;  Location: [facility];  Service: General;  Laterality: Right;   POLYPECTOMY  01/25/2020   Procedure: POLYPECTOMY;  Surgeon: Cindie Carlin POUR, DO;  [facility];  Service: Endoscopy;;   POLYPECTOMY  04/07/2020   Procedure: POLYPECTOMY INTESTINAL;  Surgeon: Cindie Carlin POUR, DO;  [facility];  Service: Endoscopy;;   PORT-A-CATH REMOVAL Right 09/28/2022   Procedure: MINOR REMOVAL PORT-A-CATH;  Surgeon: Evonnie Dorothyann LABOR, DO;  Location: [facility];  Service: General;  Laterality: Right;   PORTACATH PLACEMENT Right 04/12/2023   Procedure: INSERTION PORT-A-CATH, RIJ;  Surgeon: Evonnie Dorothyann LABOR, DO;  Location: [facility];  Service: General;  Laterality: Right;   SKIN CANCER EXCISION     Face   SPINE SURGERY     SUBMUCOSAL TATTOO INJECTION  01/25/2020   Procedure: SUBMUCOSAL TATTOO INJECTION;  Surgeon: Cindie Carlin POUR, DO;  [facility];  Service: Endoscopy;;     reports that she quit smoking about 8 years ago. Her smoking use included cigarettes. She started smoking about 48 years ago. She has a 60 pack-year smoking history. She has never used smokeless tobacco. She reports that she does not drink alcohol  and does not use drugs.  Allergies  Allergen Reactions   Keflex  [Cephalexin ] Nausea And Vomiting    Family History  Problem Relation Age of  Onset   Diabetes Father    Heart disease Father 76       MI   Hypertension Father    Anemia Mother        Transfusion dependent   COPD Sister    Cancer Paternal Grandmother 65       Pancreatic    Prior to Admission medications   Medication Sig Start Date End Date Taking? Authorizing Provider  acetaminophen  (TYLENOL ) 325 MG tablet Take 2 tablets (650 mg total) by mouth every 6 (six) hours as needed for mild pain (pain score 1-3) (or Fever >/= 101). 06/19/23   Warda Mcqueary, Courage, MD  acyclovir  (ZOVIRAX ) 400 MG tablet Take 1 tablet (400 mg total) by mouth 2 (two) times daily. 11/21/23   Davonna Siad, MD  albuterol  (PROVENTIL ) (2.5 MG/3ML) 0.083% nebulizer solution Take 3 mLs (2.5 mg total) by nebulization every 4 (four) hours as needed for wheezing or shortness of breath. 06/19/23   Pearlean, Courage, MD  albuterol  (VENTOLIN  HFA) 108 (90 Base) MCG/ACT inhaler Inhale 2 puffs into the lungs every 6 (six) hours as needed for  wheezing or shortness of breath. 06/19/23   Flint Hakeem, Courage, MD  bumetanide  (BUMEX ) 1 MG tablet Take 2 tablets (2 mg total) by mouth daily. 08/01/23   Rogers Hai, MD  carvedilol  (COREG ) 6.25 MG tablet Take 1 tablet (6.25 mg total) by mouth 2 (two) times daily with a meal. 06/19/23 11/28/23  Pearlean Manus, MD  desvenlafaxine  (PRISTIQ ) 100 MG 24 hr tablet Take 100 mg by mouth in the morning. 01/26/23   [provider]  dexamethasone  (DECADRON ) 4 MG tablet Take 5 tablets once every 4 weeks prior to darzalex  10/24/23   Rogers Hai, MD  esomeprazole  (NEXIUM ) 40 MG capsule Take 1 capsule (40 mg total) by mouth 2 (two) times daily before a meal. 10/05/23   Eartha Flavors, Toribio, MD  ferrous sulfate  325 (65 FE) MG tablet Take 1 tablet (325 mg total) by mouth daily with breakfast. 06/12/23 11/28/23  Shahmehdi, Adriana LABOR, MD  fluticasone -salmeterol (ADVAIR) 100-50 MCG/ACT AEPB Inhale 1 puff into the lungs 2 (two) times daily. 06/19/23   Pearlean Manus, MD   furosemide  (LASIX ) 40 MG tablet Take 1 tablet (40 mg total) by mouth daily. 06/19/23   Pearlean Manus, MD  lidocaine -prilocaine  (EMLA ) cream Apply 1 Application topically as needed. Apply a small amount to port a cath site and cover with plastic wrap 1 hour prior to infusion appointments 04/12/23   Pappayliou, Dorothyann A, DO  lovastatin  (MEVACOR ) 20 MG tablet Take 1 tablet (20 mg total) by mouth at bedtime. 06/12/23   Shahmehdi, Adriana LABOR, MD  methocarbamol  (ROBAXIN ) 750 MG tablet Take 1 tablet (750 mg total) by mouth 3 (three) times daily. 06/19/23   Pearlean Manus, MD  metolazone  (ZAROXOLYN ) 2.5 MG tablet Take 1 tablet (2.5 mg total) by mouth daily as needed. 08/01/23   Rogers Hai, MD  nystatin  (MYCOSTATIN /NYSTOP ) powder Apply topically 2 (two) times daily. 01/30/23   Dettinger, Fonda LABOR, MD  oxyCODONE  (OXY IR/ROXICODONE ) 5 MG immediate release tablet Take 1 tablet (5 mg total) by mouth every 8 (eight) hours as needed for severe pain (pain score 7-10). 06/19/23   Pearlean Manus, MD  oxyCODONE -acetaminophen  (PERCOCET) 10-325 MG tablet Take 1 tablet by mouth 4 (four) times daily as needed. 10/13/23   [provider]  OXYGEN  Inhale 3 L into the lungs continuous.    [provider]  OZEMPIC , 0.25 OR 0.5 MG/DOSE, 2 MG/3ML SOPN Inject 0.25 mg into the skin every 7 (seven) days. 12/02/22   Jolinda Norene HERO, DO  potassium chloride  SA (KLOR-CON  M) 20 MEQ tablet Take 1 tablet (20 mEq total) by mouth 2 (two) times daily. 07/04/23 11/28/23  Rogers Hai, MD    Physical Exam: Vitals:   12/24/23 1804 12/24/23 1811 12/24/23 1815 12/24/23 1831  BP: (!) 104/93 123/84 (!) 117/47 132/75  Pulse: (!) 104 (!) 103 98 (!) 103  Resp: (!) 22 (!) 27 20   Temp:      TempSrc:      SpO2: 99% 100% 100% 100%  Weight:      Height:        Constitutional: NAD, calm, comfortable Vitals:   12/24/23 1804 12/24/23 1811 12/24/23 1815 12/24/23 1831  BP: (!) 104/93 123/84 (!) 117/47 132/75   Pulse: (!) 104 (!) 103 98 (!) 103  Resp: (!) 22 (!) 27 20   Temp:      TempSrc:      SpO2: 99% 100% 100% 100%  Weight:      Height:       Eyes: PERRL,  lids and conjunctivae normal ENMT: Mucous membranes are moist.  Neck: normal, supple, no masses, no thyromegaly Respiratory: clear to auscultation bilaterally, no wheezing, no crackles. Normal respiratory effort. No accessory muscle use.  Cardiovascular: Regular rate and rhythm, no murmurs / rubs / gallops. Chronic lymphedema with chronic stasis changes, extremities warm.  Trace to 1+ pitting bilateral lower extremity edema. Abdomen: Obese, no tenderness, no masses palpated. No hepatosplenomegaly.  Musculoskeletal: no clubbing / cyanosis. No joint deformity upper and lower extremities.  Skin: no rashes, lesions, ulcers. No induration Neurologic: Moving extremities spontaneously, speech fluent.  Psychiatric: Normal judgment and insight. Alert and oriented x 3. Normal mood.   Labs on Admission: I have personally reviewed following labs and imaging studies  CBC: Recent Labs  Lab 12/24/23 1713  WBC 3.9*  NEUTROABS 2.6  HGB 6.0*  HCT 21.2*  MCV 103.4*  PLT 92*   Basic Metabolic Panel: Recent Labs  Lab 12/24/23 1713  NA 141  K 3.6  CL 102  CO2 31  GLUCOSE 149*  BUN 15  CREATININE 0.56  CALCIUM  8.1*   GFR: Estimated Creatinine Clearance: 81 mL/min (by C-G formula based on SCr of 0.56 mg/dL). Liver Function Tests: Recent Labs  Lab 12/24/23 1713  AST 16  ALT 11  ALKPHOS 76  BILITOT 0.6  PROT 5.0*  ALBUMIN 2.8*   Coagulation Profile: Recent Labs  Lab 12/24/23 1713  INR 1.0   Urine analysis:    Component Value Date/Time   COLORURINE STRAW (A) 09/26/2022 1800   APPEARANCEUR CLEAR 09/26/2022 1800   APPEARANCEUR Cloudy (A) 10/19/2021 1223   LABSPEC 1.005 09/26/2022 1800   PHURINE 6.0 09/26/2022 1800   GLUCOSEU NEGATIVE 09/26/2022 1800   HGBUR SMALL (A) 09/26/2022 1800   HGBUR negative 07/23/2007 1049    BILIRUBINUR NEGATIVE 09/26/2022 1800   BILIRUBINUR Negative 10/19/2021 1223   KETONESUR NEGATIVE 09/26/2022 1800   PROTEINUR NEGATIVE 09/26/2022 1800   UROBILINOGEN 0.2 04/30/2015 1205   UROBILINOGEN 0.2 07/23/2007 1049   NITRITE NEGATIVE 09/26/2022 1800   LEUKOCYTESUR NEGATIVE 09/26/2022 1800   Radiological Exams on Admission: DG Chest Portable 1 View Result Date: 12/24/2023 CLINICAL DATA:  Cough and fever. EXAM: PORTABLE CHEST 1 VIEW COMPARISON:  November 13, 2023 FINDINGS: There is stable right-sided venous Port-A-Cath positioning. The cardiac silhouette is enlarged and unchanged in size. There is marked severity calcification of the aortic arch. Prominence of the central pulmonary vasculature is seen. Mild, diffusely increased interstitial lung markings are noted. No pleural effusion or pneumothorax is identified. Multilevel degenerative changes are seen throughout the thoracic spine. IMPRESSION: Cardiomegaly with mild pulmonary vascular congestion. Electronically Signed   By: Suzen Dials M.D.   On: 12/24/2023 17:14   EKG: Independently reviewed.  Sinus tachycardia, rate 106, QTc 478.  No significant ST or T wave changes from prior.  Assessment/Plan Principal Problem:   Acute on chronic respiratory failure with hypoxia (HCC) Active Problems:   Acute on chronic anemia   Acute on chronic congestive heart failure (HCC)   COVID-19 virus infection   Diabetes mellitus, type 2 (HCC)   COPD (chronic obstructive pulmonary disease) (HCC)   Hypertension associated with diabetes (HCC)   Cirrhosis of liver (HCC)  Assessment and Plan:  Acute on chronic respiratory failure with hypoxia-O2 sats down to 81% on home 3 L, currently on 4 L and satting > 98%.  Likely secondary to COVID, and decompensated CHF, with chest x-ray-mild pulmonary vascular congestion.  Acute on chronic anemia/GI bleed-hemoglobin 6, recent baseline 7-8.  Reports melena over the past 2 weeks.  No hematemesis or  hematochezia.  Denies NSAID use.  Last upper endoscopy 07/2020- prominent blood vessels underneath and at the base of the tongue, Grade 1 esophageal varices, gastritis. - EDP talked to Dr. Shaaron, will see in consult in a.m., recommended ceftriaxone  and octreotide  - IV Protonix  80 x 1, continue 40 twice daily -  transfuse 1 unit PRBC for now - Trend H&H  Acute on chronic diastolic CHF-chest x-ray with mild pulmonary vascular congestion, she reports bilateral lower extremity swelling, she has chronic lymphedema.  BNP 55-x-ray not reflective of volume loss due to habitus.  Has not been compliant with Lasix , takes it 4 times a week on average.  Last echo 09/2022 EF of 65 to 70% G1 DD. - IV Lasix  40 mg x 1, continue 40 twice daily - Strict input output, daily weight, daily BMP  COVID infection-presented with dyspnea, cough and worsening baseline hypoxia x 2 days.  Pulmonary edema likely contributing to symptoms.  - Will start Solu-Medrol -weight-based dosing - Check ferritin, LDH, CRP, D-dimer  Diabetes mellitus - Hold Ozempic  - SSI- M - Monitor glucose while on steroids - HgbA1c  Hypertension-stable. - Hold carvedilol  in the setting of GI bleed.  Liver cirrhosis-with history of esophageal varices, thrombocytopenia- platelets of 92.  COPD-stable. -Bronchodilators, steroids for COPD and hypoxia.  OSA - not on CPAP because she does not tolerate it.   DVT prophylaxis: SCDs Code Status: Full code Family Communication: None at bedside Disposition Plan: ~ 2 days Consults called: Gastroenterology Admission status: Inpatient, telemetry I certify that at the point of admission it is my clinical judgment that the patient will require inpatient hospital care spanning beyond 2 midnights from the point of admission due to high intensity of service, high risk for further deterioration and high frequency of surveillance required.   Author: Tully FORBES Carwin, MD 12/24/2023 9:33 PM  For on call  review [email or web address].

## 2023-12-24 NOTE — ED Notes (Signed)
 Hospitality repaged

## 2023-12-25 DIAGNOSIS — D696 Thrombocytopenia, unspecified: Secondary | ICD-10-CM | POA: Diagnosis not present

## 2023-12-25 DIAGNOSIS — K7460 Unspecified cirrhosis of liver: Secondary | ICD-10-CM

## 2023-12-25 DIAGNOSIS — J441 Chronic obstructive pulmonary disease with (acute) exacerbation: Secondary | ICD-10-CM

## 2023-12-25 DIAGNOSIS — J9621 Acute and chronic respiratory failure with hypoxia: Secondary | ICD-10-CM | POA: Diagnosis not present

## 2023-12-25 DIAGNOSIS — I5033 Acute on chronic diastolic (congestive) heart failure: Secondary | ICD-10-CM | POA: Diagnosis not present

## 2023-12-25 DIAGNOSIS — K7469 Other cirrhosis of liver: Secondary | ICD-10-CM | POA: Diagnosis not present

## 2023-12-25 DIAGNOSIS — D509 Iron deficiency anemia, unspecified: Secondary | ICD-10-CM | POA: Diagnosis not present

## 2023-12-25 DIAGNOSIS — K766 Portal hypertension: Secondary | ICD-10-CM

## 2023-12-25 DIAGNOSIS — K922 Gastrointestinal hemorrhage, unspecified: Secondary | ICD-10-CM

## 2023-12-25 LAB — C-REACTIVE PROTEIN: CRP: 1 mg/dL — ABNORMAL HIGH (ref <1.0)

## 2023-12-25 LAB — BASIC METABOLIC PANEL WITH GFR
Anion gap: 10 (ref 5–15)
BUN: 13 mg/dL (ref 8–23)
CO2: 31 mmol/L (ref 22–32)
Calcium: 7.8 mg/dL — ABNORMAL LOW (ref 8.9–10.3)
Chloride: 101 mmol/L (ref 98–111)
Creatinine, Ser: 0.55 mg/dL (ref 0.44–1.00)
GFR, Estimated: >60 mL/min (ref >60)
Glucose, Bld: 181 mg/dL — ABNORMAL HIGH (ref 70–99)
Potassium: 3.8 mmol/L (ref 3.5–5.1)
Sodium: 142 mmol/L (ref 135–145)

## 2023-12-25 LAB — GLUCOSE, CAPILLARY
Glucose-Capillary: 174 mg/dL — ABNORMAL HIGH (ref 70–99)
Glucose-Capillary: 249 mg/dL — ABNORMAL HIGH (ref 70–99)
Glucose-Capillary: 213 mg/dL — ABNORMAL HIGH (ref 70–99)
Glucose-Capillary: 216 mg/dL — ABNORMAL HIGH (ref 70–99)

## 2023-12-25 LAB — CBC
HCT: 21.4 % — ABNORMAL LOW (ref 36.0–46.0)
Hemoglobin: 6 g/dL — CL (ref 12.0–15.0)
MCH: 28.8 pg (ref 26.0–34.0)
MCHC: 28 g/dL — ABNORMAL LOW (ref 30.0–36.0)
MCV: 102.9 fL — ABNORMAL HIGH (ref 80.0–100.0)
Platelets: 89 K/uL — ABNORMAL LOW (ref 150–400)
RBC: 2.08 MIL/uL — ABNORMAL LOW (ref 3.87–5.11)
RDW: 17.9 % — ABNORMAL HIGH (ref 11.5–15.5)
WBC: 3.1 K/uL — ABNORMAL LOW (ref 4.0–10.5)
nRBC: 0 % (ref 0.0–0.2)

## 2023-12-25 LAB — LACTATE DEHYDROGENASE: LDH: 93 U/L — ABNORMAL LOW (ref 98–192)

## 2023-12-25 LAB — HEMOGLOBIN A1C
Hgb A1c MFr Bld: 3.7 % — ABNORMAL LOW (ref 4.8–5.6)
Mean Plasma Glucose: 59.49 mg/dL

## 2023-12-25 MED ORDER — ACETAMINOPHEN 325 MG PO TABS: 650.0000 mg | ORAL_TABLET | Freq: Four times a day (QID) | ORAL | Status: DC | PRN

## 2023-12-25 MED ORDER — SODIUM CHLORIDE 0.9% FLUSH
10.0000 mL | Freq: Two times a day (BID) | INTRAVENOUS | Status: DC
  Administered 2023-12-25 – 2023-12-28 (×6): 10 mL

## 2023-12-25 MED ORDER — ONDANSETRON HCL 4 MG PO TABS: 4.0000 mg | ORAL_TABLET | Freq: Four times a day (QID) | ORAL | Status: DC | PRN

## 2023-12-25 MED ORDER — SODIUM CHLORIDE 0.9% IV SOLUTION: Freq: Once | INTRAVENOUS | Status: AC

## 2023-12-25 MED ORDER — ALBUTEROL SULFATE HFA 108 (90 BASE) MCG/ACT IN AERS: 2.0000 | INHALATION_SPRAY | Freq: Four times a day (QID) | RESPIRATORY_TRACT | Status: DC | PRN

## 2023-12-25 MED ORDER — POLYETHYLENE GLYCOL 3350 17 G PO PACK: 17.0000 g | PACK | Freq: Every day | ORAL | Status: DC | PRN

## 2023-12-25 MED ORDER — ONDANSETRON HCL 4 MG/2ML IJ SOLN: 4.0000 mg | Freq: Four times a day (QID) | INTRAMUSCULAR | Status: DC | PRN

## 2023-12-25 MED ORDER — SODIUM CHLORIDE 0.9 % IV SOLN
2.0000 g | INTRAVENOUS | Status: DC
  Administered 2023-12-25 – 2023-12-27 (×3): 2 g via INTRAVENOUS
  Filled 2023-12-25 (×3): qty 20

## 2023-12-25 MED ORDER — ALBUTEROL SULFATE (2.5 MG/3ML) 0.083% IN NEBU: 2.5000 mg | INHALATION_SOLUTION | Freq: Four times a day (QID) | RESPIRATORY_TRACT | Status: DC | PRN

## 2023-12-25 MED ORDER — ACETAMINOPHEN 650 MG RE SUPP: 650.0000 mg | Freq: Four times a day (QID) | RECTAL | Status: DC | PRN

## 2023-12-25 MED ORDER — FUROSEMIDE 10 MG/ML IJ SOLN
40.0000 mg | Freq: Two times a day (BID) | INTRAMUSCULAR | Status: AC
  Administered 2023-12-25 (×2): 40 mg via INTRAVENOUS
  Filled 2023-12-25 (×2): qty 4

## 2023-12-25 MED ORDER — SODIUM CHLORIDE 0.9% FLUSH: 10.0000 mL | INTRAVENOUS | Status: DC | PRN

## 2023-12-25 MED ORDER — CHLORHEXIDINE GLUCONATE CLOTH 2 % EX PADS
6.0000 | MEDICATED_PAD | Freq: Every day | CUTANEOUS | Status: DC
  Administered 2023-12-25 – 2023-12-27 (×3): 6 via TOPICAL

## 2023-12-25 MED ORDER — FLUTICASONE FUROATE-VILANTEROL 100-25 MCG/ACT IN AEPB
1.0000 | INHALATION_SPRAY | Freq: Every day | RESPIRATORY_TRACT | Status: DC
  Administered 2023-12-25 – 2023-12-28 (×4): 1 via RESPIRATORY_TRACT
  Filled 2023-12-25: qty 28

## 2023-12-25 MED ORDER — NYSTATIN 100000 UNIT/GM EX POWD
Freq: Three times a day (TID) | CUTANEOUS | Status: DC
  Filled 2023-12-25 (×2): qty 15

## 2023-12-25 NOTE — Plan of Care (Signed)
  Problem: Education: Goal: Knowledge of General Education information will improve Description: Including pain rating scale, medication(s)/side effects and non-pharmacologic comfort measures Outcome: Progressing   Problem: Health Behavior/Discharge Planning: Goal: Ability to manage health-related needs will improve Outcome: Progressing   Problem: Clinical Measurements: Goal: Ability to maintain clinical measurements within normal limits will improve Outcome: Progressing Goal: Will remain free from infection Outcome: Progressing Goal: Diagnostic test results will improve Outcome: Progressing Goal: Respiratory complications will improve Outcome: Progressing Goal: Cardiovascular complication will be avoided Outcome: Progressing   Problem: Activity: Goal: Risk for activity intolerance will decrease Outcome: Progressing   Problem: Nutrition: Goal: Adequate nutrition will be maintained Outcome: Progressing   Problem: Coping: Goal: Level of anxiety will decrease Outcome: Progressing   Problem: Elimination: Goal: Will not experience complications related to bowel motility Outcome: Progressing Goal: Will not experience complications related to urinary retention Outcome: Progressing   Problem: Pain Managment: Goal: General experience of comfort will improve and/or be controlled Outcome: Progressing   Problem: Safety: Goal: Ability to remain free from injury will improve Outcome: Progressing   Problem: Skin Integrity: Goal: Risk for impaired skin integrity will decrease Outcome: Progressing   Problem: Education: Goal: Ability to describe self-care measures that may prevent or decrease complications (Diabetes Survival Skills Education) will improve Outcome: Progressing Goal: Individualized Educational Video(s) Outcome: Progressing   Problem: Coping: Goal: Ability to adjust to condition or change in health will improve Outcome: Progressing   Problem: Fluid  Volume: Goal: Ability to maintain a balanced intake and output will improve Outcome: Progressing   Problem: Health Behavior/Discharge Planning: Goal: Ability to identify and utilize available resources and services will improve Outcome: Progressing Goal: Ability to manage health-related needs will improve Outcome: Progressing   Problem: Metabolic: Goal: Ability to maintain appropriate glucose levels will improve Outcome: Progressing   Problem: Nutritional: Goal: Maintenance of adequate nutrition will improve Outcome: Progressing Goal: Progress toward achieving an optimal weight will improve Outcome: Progressing   Problem: Skin Integrity: Goal: Risk for impaired skin integrity will decrease Outcome: Progressing   Problem: Tissue Perfusion: Goal: Adequacy of tissue perfusion will improve Outcome: Progressing   Problem: Education: Goal: Ability to demonstrate management of disease process will improve Outcome: Progressing Goal: Ability to verbalize understanding of medication therapies will improve Outcome: Progressing Goal: Individualized Educational Video(s) Outcome: Progressing   Problem: Activity: Goal: Capacity to carry out activities will improve Outcome: Progressing   Problem: Cardiac: Goal: Ability to achieve and maintain adequate cardiopulmonary perfusion will improve Outcome: Progressing

## 2023-12-25 NOTE — TOC Initial Note (Signed)
 Transition of Care [facility]) - Initial/Assessment Note    Patient Details  Name: Savannah Burns MRN: [id number] Date of Birth: [birth year]  Transition of Care [facility]) CM/SW Contact:    Noreen KATHEE Pinal, LCSWA Phone Number: 12/25/2023, 10:02 AM  Clinical Narrative:                  Patient is at risk for readmission. Patient was admitted for Acute on chronic respiratory failure with hypoxia. Patient shared that her granddaughter lives in the home with her and is assist her with her ADL's. Patient reports that she is still able to drive and that she use's a walker. Plans are for patient to return back home. CSW will continue to follow.   Expected Discharge Plan: Home/Self Care Barriers to Discharge: Continued Medical Work up   Patient Goals and CMS Choice Patient states their goals for this hospitalization and ongoing recovery are:: Get better and return back home CMS Medicare.gov Compare Post Acute Care list provided to:: Patient        Expected Discharge Plan and Services In-house Referral: Clinical Social Work   Post Acute Care Choice: Durable Medical Equipment Living arrangements for the past 2 months: Single Family Home                                      Prior Living Arrangements/Services Living arrangements for the past 2 months: Single Family Home Lives with:: Relatives (Granddaughter) Patient language and need for interpreter reviewed:: Yes Do you feel safe going back to the place where you live?: Yes      Need for Family Participation in Patient Care: Yes (Comment) Care giver support system in place?: Yes (comment) Current home services: DME Criminal Activity/Legal Involvement Pertinent to Current Situation/Hospitalization: No - Comment as needed  Activities of Daily Living   ADL Screening (condition at time of admission) Independently performs ADLs?: Yes (appropriate for developmental age) Is the patient deaf or have difficulty hearing?: No Does the  patient have difficulty seeing, even when wearing glasses/contacts?: No Does the patient have difficulty concentrating, remembering, or making decisions?: No  Permission Sought/Granted      Share Information with NAME: Savannah Burns      Permission granted to share info w Relationship: Patient     Emotional Assessment Appearance:: Appears stated age   Affect (typically observed): Accepting Orientation: : Oriented to Self, Oriented to Place, Oriented to  Time, Oriented to Situation Alcohol  / Substance Use: Not Applicable Psych Involvement: No (comment)  Admission diagnosis:  UGIB (upper gastrointestinal bleed) [K92.2] Acute on chronic respiratory failure (HCC) [J96.20] Symptomatic anemia [D64.9] Acute hypoxic respiratory failure (HCC) [J96.01] COVID-19 [U07.1] Patient Active Problem List   Diagnosis Date Noted   COVID-19 virus infection 12/24/2023   Vitamin B12 deficiency 11/21/2023   Esophageal varices (HCC) 10/06/2023   Adrenal mass 10/06/2023   Acute on chronic respiratory failure with hypoxia (HCC) 06/09/2023   Long term (current) use of antibiotics 10/25/2022   Gastroesophageal reflux disease without esophagitis 10/03/2022   Port or reservoir infection 09/28/2022   Axillary abscess 09/28/2022   Staphylococcus aureus bacteremia 09/27/2022   MSSA bacteremia 09/27/2022   Obesity, Class III, BMI 40-49.9 (morbid obesity) 09/26/2022   Chronic respiratory failure with hypoxia (HCC) 09/26/2022   Thrombocytopenia 09/26/2022   Cellulitis of axilla, right 09/26/2022   Diabetic peripheral neuropathy associated with type 2 diabetes mellitus (HCC) 02/25/2022   Multiple myeloma (HCC)  11/30/2021   H. pylori infection 08/12/2021   Cirrhosis of liver (HCC) 08/04/2021   Hypoalbuminemia 05/17/2020   Moderate protein malnutrition 05/17/2020   Melena 05/16/2020   Hyperlipidemia    Hypokalemia    Lower GI bleed 03/19/2020   Acute on chronic congestive heart failure (HCC)    Acute on  chronic anemia    Iron  deficiency anemia    Heme positive stool    ABLA (acute blood loss anemia) 01/22/2020   Supplemental oxygen  dependent 10/22/2019   DOE (dyspnea on exertion) 10/22/2019   Hypertension associated with diabetes (HCC) 10/22/2019   Bilateral hand pain 09/21/2016   Fatigue 07/30/2015   Chronic diastolic HF (heart failure) (HCC) 06/29/2015   Postmenopausal bleeding 04/17/2015   Excessive daytime sleepiness 12/19/2014   Swelling of lower extremity 11/27/2014   Back pain 06/13/2013   Sinusitis, chronic 05/30/2012   Allergic rhinitis 06/06/2011   Hyperlipidemia associated with type 2 diabetes mellitus (HCC) 03/24/2009   Diabetes mellitus, type 2 (HCC) 08/12/2008   Pulmonary nodule 08/29/2007   COPD (chronic obstructive pulmonary disease) (HCC) 02/14/2007   Class 3 obesity 05/25/2006   Depression, recurrent 05/25/2006   HYPERTENSION, BENIGN SYSTEMIC 05/25/2006   DJD, UNSPECIFIED 05/25/2006   PCP:  Jolinda Norene HERO, DO Pharmacy:   [facility] [address] [place] - [address] [place] [address] Phone: [phone] Fax: [phone]  [facility] - [place], [place] - [address] [place] [address] Phone: [phone] Fax: [phone]     Social Drivers of Health (SDOH) Social History: SDOH Screenings   Food Insecurity: No Food Insecurity (12/24/2023)  Housing: Low Risk  (12/24/2023)  Transportation Needs: No Transportation Needs (12/24/2023)  Utilities: Not At Risk (12/24/2023)  Alcohol  Screen: Low Risk  (01/27/2023)  Depression (PHQ2-9): Low Risk  (11/21/2023)  Financial Resource Strain: Low Risk  (06/12/2023)  Physical Activity: Inactive (01/27/2023)  Social Connections: Socially Isolated (12/24/2023)  Stress: No Stress Concern Present (01/27/2023)  Tobacco Use: Medium Risk (12/24/2023)  Health Literacy: Adequate Health Literacy (01/27/2023)   SDOH Interventions:     Readmission Risk  Interventions    12/25/2023   10:00 AM  Readmission Risk Prevention Plan  Transportation Screening Complete  HRI or Home Care Consult Complete  Social Work Consult for Recovery Care Planning/Counseling Complete  Palliative Care Screening Not Applicable  Medication Review Oceanographer) Complete

## 2023-12-25 NOTE — Consult Note (Signed)
 Gastroenterology Consult   Referring Provider: [facility] Primary Care Physician:  Jolinda Norene HERO, DO Primary Gastroenterologist:  Dr. Eartha   Patient ID: Savannah Burns; [id number]; 05-21-1950   Admit date: 12/24/2023  LOS: 1 day   Date of Consultation: 12/25/2023  Reason for Consultation:  acute on chronic anemia, GI bleed  History of Present Illness   Savannah Burns is a 73 y.o. year old female  with past medical history of cirrhosis,  adrenal adenoma, diabetes, depression, COPD on continuous supplemental oxygen , multiple myeloma, recently seen as outpatient with GI office in July 2025 for recurrent IDA and concerns for melena, known history of bleeding jejunal AVM in 2023, presenting to the ED yesterday with shortness of breath, cough, found to have Hgb 6.0 and heme positive stool. She was Covid positive and admitted with acute on chronic respiratory failure, acute on chronic CHF, Covid, symptomatic anemia. GI now consulted due to concerns for GI bleed.   In the ED: Hgb 6.0, Repeat Hgb today 6.0, with request for 2 units PRBCs transfusion in process. INR 1.0. Ferritin 57. CXR with cardiomegaly and mild pulmonary vascular congestion.   Notably, her Hgb was as low as 5.7 in July 2025, and her baseline appears to be around the low 7/8 range. She has been requiring IV iron  infusions as outpatient with Hematology. Followed closely by them. Previously had been on Revlimid  but on hold due to thrombocytopenia.   Today in consultation: Has been seeing black stool daily for months. Presentation yesterday with dark stool was not a new finding. Takes oral iron  by mouth about every other day. Nexium  BID. No abdominal pain. No N/V. No hematochezia. Came in with shortness of breath. Feels better today after diuretics. At home on 3 liters of O2. Currently on this now. No chest pain. Takes Ibuprofen if headache but rare. Last Ozempic  2 months ago. Good appetite. No dysphagia. No  confusion or mental status changes.   Regarding cirrhosis care: had difficulty fitting in MRI and once in, unable to hold breath on command. Was unable to complete this for hepatoma screening and would benefit from open MRI as outpatient.   Diuretics: non-compliant and skips days as she doesn't want to have frequent urination. If goes out of the house, will also skip. Will skip about 3-4 days a week. Home med list includes Bumex  2 mg daily, metolazone  2.5 mg daily prn, lasix  40 mg daily. Per cardiology note, she was taking lasix  and this would be verified in future as what regimen she should be taking. Unclear outpatient regimen.   Has potty chair in living room. Sleeps in a recliner as easier to sit straight up and breathe.   Hx of mild to moderate AS, ECHO had been requested to be updated to assess for worsening. Will not be candidate at [facility] if severe aortic stenosis. NO showed this visit.   Pulmonary: may need Bipap or intubation if any endoscopic procedure and recommend minimal sedation.   Prior endoscopic procedures:   [facility] on 07/21/2021 (Dr. Gaile).  She was being evaluated for possible retrograde double-balloon enteroscopy after being found to have possible blood in capsule study.  Due to recurrent bleeding, she was admitted to [facility] after he clinic visit  -workup at that time showed normal esophagogastroduodenospy. 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  Patient achieved hemostasis and did  not have any more episodes of melena.    Last Colonoscopy:04/07/2020 - Preparation of the colon was fair. - Non- bleeding internal hemorrhoids. - Diverticulosis in the sigmoid colon and in the descending colon. - One 10 mm polyp in the cecum, removed with mucosal resection. Resected and retrieved. - One 7 mm polyp in the ascending colon, removed  with a hot snare. Resected and retrieved. - One 18 mm polyp at the hepatic flexure, removed with a hot snare. Resected and retrieved. - Post- polypectomy scar in the transverse colon. Biopsied. - Mucosal resection was performed. Resection and retrieval were complete. PATH: sessile serrated adenoma, tubular adenoma. Surveillance in 3 years.    Past Medical History:  Diagnosis Date   Adrenal adenoma, left    Stable   Anxiety    Arthritis    bilateral hands   COPD (chronic obstructive pulmonary disease) (HCC)    Depression    Diabetes mellitus, type 2 (HCC) 08/12/2008   Qualifier: Diagnosis of  By: Jenetta MD, Talia     Dyspnea    Esophageal varices (HCC)    Grade II diastolic dysfunction    History of kidney stones    Hyperlipidemia    Hypertension    Lower back pain    Lower GI bleed 03/19/2020   Panic attacks    Pneumonia    currently taking antibiotic and prednisone  for early stages of pneumonia   Pulmonary nodules    bilateral   Skin cancer    face    Past Surgical History:  Procedure Laterality Date   BIOPSY  04/07/2020   Procedure: BIOPSY;  Surgeon: Cindie Carlin POUR, DO;  [facility]: [facility];  Service: Endoscopy;;   Breast Cystectomy  Right    CESAREAN SECTION     COLONOSCOPY WITH PROPOFOL  N/A 01/25/2020   Dr. Cindie: Nonbleeding internal hemorrhoids, diverticulosis, 5 mm polyp removed from the ascending colon, 10 mm polyp removed from the sigmoid colon, 30 mm polyp (tubulovillous adenoma with no high-grade dysplasia) removed from the transverse colon via piecemeal status post tattoo.  Other polyps were tubular adenomas.  3 month surveillance colonoscopy recommended.   COLONOSCOPY WITH PROPOFOL  N/A 04/07/2020   Procedure: COLONOSCOPY WITH PROPOFOL ;  Surgeon: Cindie Carlin POUR, DO;  Location: [facility];  Service: Endoscopy;  Laterality: N/A;  3:00pm, pt knows new time per office   CYSTOSCOPY/URETEROSCOPY/HOLMIUM LASER/STENT PLACEMENT Bilateral 03/01/2019   Procedure:  CYSTOSCOPY/RETROGRADEURETEROSCOPY/HOLMIUM LASER/STENT PLACEMENT;  Surgeon: Devere Lonni Righter, MD;  Location: [facility];  Service: Urology;  Laterality: Bilateral;  ONLY NEEDS 60 MIN   ESOPHAGOGASTRODUODENOSCOPY (EGD) WITH PROPOFOL  N/A 01/25/2020   Dr. Cindie: 4 columns grade 1 esophageal varices   ESOPHAGOGASTRODUODENOSCOPY (EGD) WITH PROPOFOL  N/A 05/18/2020   Procedure: ESOPHAGOGASTRODUODENOSCOPY (EGD) WITH PROPOFOL ;  Surgeon: Cindie Carlin POUR, DO;  [facility];  Service: Endoscopy;  Laterality: N/A;   ESOPHAGOGASTRODUODENOSCOPY (EGD) WITH PROPOFOL  N/A 07/28/2020   Procedure: ESOPHAGOGASTRODUODENOSCOPY (EGD) WITH PROPOFOL ;  Surgeon: Cindie Carlin POUR, DO;  [facility];  Service: Endoscopy;  Laterality: N/A;  am or early PM due to givens capsule placement   GIVENS CAPSULE STUDY N/A 05/18/2020   Procedure: GIVENS CAPSULE STUDY;  Surgeon: Eartha Angelia Sieving, MD;  [facility];  Service: Gastroenterology;  Laterality: N/A;   GIVENS CAPSULE STUDY N/A 07/28/2020   Procedure: GIVENS CAPSULE STUDY;  Surgeon: Cindie Carlin POUR, DO;  [facility];  Service: Endoscopy;  Laterality: N/A;   IR IMAGING GUIDED PORT INSERTION  12/02/2021  IRRIGATION AND DEBRIDEMENT ABSCESS Right 09/28/2022   Procedure: IRRIGATION AND DEBRIDEMENT ABSCESS;  Surgeon: Evonnie Dorothyann LABOR, DO;  Location: [facility];  Service: General;  Laterality: Right;   POLYPECTOMY  01/25/2020   Procedure: POLYPECTOMY;  Surgeon: Cindie Carlin POUR, DO;  [facility];  Service: Endoscopy;;   POLYPECTOMY  04/07/2020   Procedure: POLYPECTOMY INTESTINAL;  Surgeon: Cindie Carlin POUR, DO;  [facility];  Service: Endoscopy;;   PORT-A-CATH REMOVAL Right 09/28/2022   Procedure: MINOR REMOVAL PORT-A-CATH;  Surgeon: Evonnie Dorothyann LABOR, DO;  Location: [facility];  Service: General;  Laterality: Right;   PORTACATH PLACEMENT Right 04/12/2023   Procedure: INSERTION PORT-A-CATH, RIJ;  Surgeon:  Evonnie Dorothyann LABOR, DO;  Location: [facility];  Service: General;  Laterality: Right;   SKIN CANCER EXCISION     Face   SPINE SURGERY     SUBMUCOSAL TATTOO INJECTION  01/25/2020   Procedure: SUBMUCOSAL TATTOO INJECTION;  Surgeon: Cindie Carlin POUR, DO;  [facility];  Service: Endoscopy;;    Prior to Admission medications   Medication Sig Start Date End Date Taking? Authorizing Provider  acetaminophen  (TYLENOL ) 325 MG tablet Take 2 tablets (650 mg total) by mouth every 6 (six) hours as needed for mild pain (pain score 1-3) (or Fever >/= 101). 06/19/23   Emokpae, Courage, MD  acyclovir  (ZOVIRAX ) 400 MG tablet Take 1 tablet (400 mg total) by mouth 2 (two) times daily. 11/21/23   Davonna Siad, MD  albuterol  (PROVENTIL ) (2.5 MG/3ML) 0.083% nebulizer solution Take 3 mLs (2.5 mg total) by nebulization every 4 (four) hours as needed for wheezing or shortness of breath. 06/19/23   Pearlean Manus, MD  albuterol  (VENTOLIN  HFA) 108 (90 Base) MCG/ACT inhaler Inhale 2 puffs into the lungs every 6 (six) hours as needed for wheezing or shortness of breath. 06/19/23   Pearlean, Courage, MD  bumetanide  (BUMEX ) 1 MG tablet Take 2 tablets (2 mg total) by mouth daily. 08/01/23   Rogers Hai, MD  carvedilol  (COREG ) 6.25 MG tablet Take 1 tablet (6.25 mg total) by mouth 2 (two) times daily with a meal. 06/19/23 11/28/23  Pearlean Manus, MD  desvenlafaxine  (PRISTIQ ) 100 MG 24 hr tablet Take 100 mg by mouth in the morning. 01/26/23   [provider]  dexamethasone  (DECADRON ) 4 MG tablet Take 5 tablets once every 4 weeks prior to darzalex  10/24/23   Rogers Hai, MD  esomeprazole  (NEXIUM ) 40 MG capsule Take 1 capsule (40 mg total) by mouth 2 (two) times daily before a meal. 10/05/23   Eartha Flavors, Toribio, MD  ferrous sulfate  325 (65 FE) MG tablet Take 1 tablet (325 mg total) by mouth daily with breakfast. 06/12/23 11/28/23  Shahmehdi, Adriana LABOR, MD  fluticasone -salmeterol (ADVAIR) 100-50  MCG/ACT AEPB Inhale 1 puff into the lungs 2 (two) times daily. 06/19/23   Pearlean Manus, MD  furosemide  (LASIX ) 40 MG tablet Take 1 tablet (40 mg total) by mouth daily. 06/19/23   Pearlean Manus, MD  lidocaine -prilocaine  (EMLA ) cream Apply 1 Application topically as needed. Apply a small amount to port a cath site and cover with plastic wrap 1 hour prior to infusion appointments 04/12/23   Pappayliou, Dorothyann A, DO  lovastatin  (MEVACOR ) 20 MG tablet Take 1 tablet (20 mg total) by mouth at bedtime. 06/12/23   Shahmehdi, Adriana LABOR, MD  methocarbamol  (ROBAXIN ) 750 MG tablet Take 1 tablet (750 mg total) by mouth 3 (three) times daily. 06/19/23   Pearlean Manus, MD  metolazone  (ZAROXOLYN ) 2.5 MG  tablet Take 1 tablet (2.5 mg total) by mouth daily as needed. 08/01/23   Rogers Hai, MD  nystatin  (MYCOSTATIN /NYSTOP ) powder Apply topically 2 (two) times daily. 01/30/23   Dettinger, Fonda LABOR, MD  oxyCODONE  (OXY IR/ROXICODONE ) 5 MG immediate release tablet Take 1 tablet (5 mg total) by mouth every 8 (eight) hours as needed for severe pain (pain score 7-10). 06/19/23   Pearlean Manus, MD  oxyCODONE -acetaminophen  (PERCOCET) 10-325 MG tablet Take 1 tablet by mouth 4 (four) times daily as needed. 10/13/23   [provider]  OXYGEN  Inhale 3 L into the lungs continuous.    [provider]  OZEMPIC , 0.25 OR 0.5 MG/DOSE, 2 MG/3ML SOPN Inject 0.25 mg into the skin every 7 (seven) days. 12/02/22   Jolinda Norene HERO, DO  potassium chloride  SA (KLOR-CON  M) 20 MEQ tablet Take 1 tablet (20 mEq total) by mouth 2 (two) times daily. 07/04/23 11/28/23  Rogers Hai, MD    Current Facility-Administered Medications  Medication Dose Route Frequency Provider Last Rate Last Admin   0.9 %  sodium chloride  infusion (Manually program via Guardrails IV Fluids)   Intravenous Once Francesca Elsie CROME, MD   Held at 12/24/23 2011   0.9 %  sodium chloride  infusion (Manually program via Guardrails IV Fluids)    Intravenous Once Madera, Carlos, MD       acetaminophen  (TYLENOL ) tablet 650 mg  650 mg Oral Q6H PRN Emokpae, Ejiroghene E, MD       Or   acetaminophen  (TYLENOL ) suppository 650 mg  650 mg Rectal Q6H PRN Emokpae, Ejiroghene E, MD       albuterol  (PROVENTIL ) (2.5 MG/3ML) 0.083% nebulizer solution 2.5 mg  2.5 mg Nebulization Q6H PRN Emokpae, Ejiroghene E, MD       cefTRIAXone  (ROCEPHIN ) 2 g in sodium chloride  0.9 % 100 mL IVPB  2 g Intravenous Q24H Emokpae, Ejiroghene E, MD       fluticasone  furoate-vilanterol (BREO ELLIPTA ) 100-25 MCG/ACT 1 puff  1 puff Inhalation Daily Emokpae, Ejiroghene E, MD   1 puff at 12/25/23 0758   furosemide  (LASIX ) injection 40 mg  40 mg Intravenous Q12H Emokpae, Ejiroghene E, MD   40 mg at 12/25/23 0859   insulin  aspart (novoLOG ) injection 0-15 Units  0-15 Units Subcutaneous TID WC Emokpae, Ejiroghene E, MD   3 Units at 12/25/23 0859   insulin  aspart (novoLOG ) injection 0-5 Units  0-5 Units Subcutaneous QHS Emokpae, Ejiroghene E, MD       methylPREDNISolone  sodium succinate (SOLU-MEDROL ) 125 mg/2 mL injection 65.625 mg  0.5 mg/kg Intravenous Q12H Emokpae, Ejiroghene E, MD   65.625 mg at 12/24/23 2301   Followed by   NOREEN ON 12/28/2023] predniSONE  (DELTASONE ) tablet 50 mg  50 mg Oral Daily Emokpae, Ejiroghene E, MD       octreotide  (SANDOSTATIN ) 500 mcg in sodium chloride  0.9 % 250 mL (2 mcg/mL) infusion  50 mcg/hr Intravenous Continuous Francesca Elsie CROME, MD 25 mL/hr at 12/24/23 2132 50 mcg/hr at 12/24/23 2132   ondansetron  (ZOFRAN ) tablet 4 mg  4 mg Oral Q6H PRN Emokpae, Ejiroghene E, MD       Or   ondansetron  (ZOFRAN ) injection 4 mg  4 mg Intravenous Q6H PRN Emokpae, Ejiroghene E, MD       pantoprazole  (PROTONIX ) injection 40 mg  40 mg Intravenous Q12H Emokpae, Ejiroghene E, MD   40 mg at 12/25/23 0851   polyethylene glycol (MIRALAX  / GLYCOLAX ) packet 17 g  17 g Oral Daily PRN Emokpae, Ejiroghene E,  MD        Allergies as of 12/24/2023 - Review Complete 12/24/2023   Allergen Reaction Noted   Keflex  [cephalexin ] Nausea And Vomiting 04/17/2019    Family History  Problem Relation Age of Onset   Diabetes Father    Heart disease Father 63       MI   Hypertension Father    Anemia Mother        Transfusion dependent   COPD Sister    Cancer Paternal Grandmother 55       Pancreatic    Social History   Socioeconomic History   Marital status: Widowed    Spouse name: Not on file   Number of children: 2   Years of education: 14   Highest education level: Not on file  Occupational History   Occupation: Retired   Tobacco Use   Smoking status: Former    Current packs/day: 0.00    Average packs/day: 1.5 packs/day for 40.0 years (60.0 ttl pk-yrs)    Types: Cigarettes    Start date: 04/29/1975    Quit date: 04/29/2015    Years since quitting: 8.6   Smokeless tobacco: Never   Tobacco comments:    Quit smoking 04/2015- Previous 1.5 ppd smoker  Vaping Use   Vaping status: Never Used  Substance and Sexual Activity   Alcohol  use: No    Alcohol /week: 0.0 standard drinks of alcohol    Drug use: No   Sexual activity: Not Currently    Birth control/protection: Post-menopausal  Other Topics Concern   Not on file  Social History Narrative   Her 25 year old granddaughter lives with her - one daughter lives nearby, but she doesn't have a good relationship with her. Has a great relationship with other daughter who lives 1.5 hrs away - talks to her daily on the phone.   Social Drivers of Corporate investment banker Strain: Low Risk  (06/12/2023)   Overall Financial Resource Strain (CARDIA)    Difficulty of Paying Living Expenses: Not very hard  Food Insecurity: No Food Insecurity (12/24/2023)   Hunger Vital Sign    Worried About Running Out of Food in the Last Year: Never true    Ran Out of Food in the Last Year: Never true  Transportation Needs: No Transportation Needs (12/24/2023)   PRAPARE - Administrator, Civil Service (Medical): No    Lack  of Transportation (Non-Medical): No  Physical Activity: Inactive (01/27/2023)   Exercise Vital Sign    Days of Exercise per Week: 0 days    Minutes of Exercise per Session: 0 min  Stress: No Stress Concern Present (01/27/2023)   [employer] of Occupational Health - Occupational Stress Questionnaire    Feeling of Stress : Not at all  Social Connections: Socially Isolated (12/24/2023)   Social Connection and Isolation Panel    Frequency of Communication with Friends and Family: More than three times a week    Frequency of Social Gatherings with Friends and Family: More than three times a week    Attends Religious Services: Never    Database administrator or Organizations: No    Attends Banker Meetings: Never    Marital Status: Widowed  Intimate Partner Violence: Not At Risk (12/24/2023)   Humiliation, Afraid, Rape, and Kick questionnaire    Fear of Current or Ex-Partner: No    Emotionally Abused: No    Physically Abused: No    Sexually Abused: No  Review of Systems   Gen: Denies any fever, chills, loss of appetite, change in weight or weight loss CV: Denies chest pain, heart palpitations, syncope, edema  Resp: Denies shortness of breath with rest, cough, wheezing, coughing up blood, and pleurisy. GI: Denies vomiting blood, jaundice, and fecal incontinence.   Denies dysphagia or odynophagia. GU : Denies urinary burning, blood in urine, urinary frequency, and urinary incontinence. MS: Denies joint pain, limitation of movement, swelling, cramps, and atrophy.  Derm: Denies rash, itching, dry skin, hives. Psych: Denies depression, anxiety, memory loss, hallucinations, and confusion. Heme: Denies bruising or bleeding Neuro:  Denies any headaches, dizziness, paresthesias, shaking  Physical Exam   Vital Signs in last 24 hours: Temp:  [98.5 F (36.9 C)-100 F (37.8 C)] 98.5 F (36.9 C) (09/29 0510) Pulse Rate:  [98-113] 100 (09/29 0510) Resp:  [18-28] 18 (09/29  0510) BP: (104-154)/(47-129) 104/68 (09/29 0510) SpO2:  [81 %-100 %] 100 % (09/29 0758) Weight:  [131.1 kg-132.9 kg] 131.1 kg (09/29 0335)    General:   Alert,  Well-developed, obese, no distress Head:  Normocephalic and atraumatic. Eyes:  Sclera clear, no icterus.    Ears:  mildly hard of hearing Lungs:  scattered rhonchi, 3 liters O2 nasal cannula, no distress Heart:  3/6 systolic murmur Abdomen:  Soft, obese, non-tender, difficult to appreciate HSM in light of body habitus Rectal: deferred   Extremities:  With 2-3+ edema, chronic venous stasis changes Neurologic:  Alert and  oriented x4. Skin:  Intact without significant lesions or rashes. Psych:  Alert and cooperative. Normal mood and affect.  Intake/Output from previous day: 09/28 0701 - 09/29 0700 In: 215.5 [I.V.:215.5] Out: 650 [Urine:650] Intake/Output this shift: No intake/output data recorded.    Labs/Studies   Recent Labs Recent Labs    12/24/23 1713 12/25/23 0515  WBC 3.9* 3.1*  HGB 6.0* 6.0*  HCT 21.2* 21.4*  PLT 92* 89*   BMET Recent Labs    12/24/23 1713 12/25/23 0504  NA 141 142  K 3.6 3.8  CL 102 101  CO2 31 31  GLUCOSE 149* 181*  BUN 15 13  CREATININE 0.56 0.55  CALCIUM  8.1* 7.8*   LFT Recent Labs    12/24/23 1713  PROT 5.0*  ALBUMIN 2.8*  AST 16  ALT 11  ALKPHOS 76  BILITOT 0.6   PT/INR Recent Labs    12/24/23 1713  LABPROT 13.7  INR 1.0     Radiology/Studies DG Chest Portable 1 View Result Date: 12/24/2023 CLINICAL DATA:  Cough and fever. EXAM: PORTABLE CHEST 1 VIEW COMPARISON:  November 13, 2023 FINDINGS: There is stable right-sided venous Port-A-Cath positioning. The cardiac silhouette is enlarged and unchanged in size. There is marked severity calcification of the aortic arch. Prominence of the central pulmonary vasculature is seen. Mild, diffusely increased interstitial lung markings are noted. No pleural effusion or pneumothorax is identified. Multilevel degenerative  changes are seen throughout the thoracic spine. IMPRESSION: Cardiomegaly with mild pulmonary vascular congestion. Electronically Signed   By: Suzen Dials M.D.   On: 12/24/2023 17:14     Assessment   Savannah Burns is a 73 y.o. 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 She was Covid positive and admitted with acute on chronic respiratory failure, acute on chronic CHF, Covid, symptomatic anemia. GI now consulted due to concerns for GI bleed.   Worsening anemia: Hgb 6.0 in ED, baseline low 7/8 range, requiring transfusion as outpatient in July and rounds of iron  due to ongoing blood loss suspected due to known hx of AVMs. Last EGD/enteroscopy in 2023. Although she is heme positive, her presentation is not consistent with an acute variceal bleed, as she has had this ongoing presentation dating back to at least July 2025 when initially seen as outpatient by [facility]. She is at increased risk for anesthesia for multiple reasons including current Covid infection, COPD with pulmonary noting increased risk for possible intubation, cardiology recommending ECHO prior to any procedure in light of hx of mild/moderate aortic stenosis. Stool is black but is also on iron . Suspect stuttering GI bleed due to AVMs, unable to exclude gastropathy, does not appear variceal etiology. Known history of larger polyps in 2022 and due for colonoscopy as well. Recommend supportive measures at this time with transfusion, and we will update ECHO as previously recommended. As long as not actively bleeding with worsening anemia, can continue supportive  measures. If she were to need urgent/emergent procedure, unclear if would be a candidate at [facility]. Will update ECHO to help guide decision-making if this were to occur. Ideally, endoscopic procedures will be done as outpatient once over acute illness.   Cirrhosis: MELD 3.0 today is 9. complicated by portal hypertension with thrombocytopenia, grade 1 varices. Will need further cirrhosis care as outpatient.  History of large polyps in 2022: due for 3 year surveillance and can be done as outpatient    Plan / Recommendations    Agree with blood transfusion PPI IV BID, continue with empiric octreotide  and abx ECHO today Continue with diuresis per hospitalist MELD labs in am Ideally would have EGD as outpatient. Inpatient if overtly bleeding or transfusion dependent anemia. May not be a candidate for anesthesia at [facility]. Colonoscopy as outpatient Hold oral iron  while inpatient Cirrhosis care as outpatient     12/25/2023, 9:27 AM  Therisa MICAEL Stager, PhD, ANP-BC [facility]

## 2023-12-25 NOTE — Progress Notes (Signed)
 Progress Note   Patient: Savannah Burns  TEIRRA CARAPIA FMW:[id number] DOB: 1951/03/14 DOA: 12/24/2023     1 DOS: the patient was seen and examined on 12/25/2023   Brief hospital admission narrative: As per H&P written by Dr. Pearlean on 12/24/2023 Savannah  G Burns is a 73 y.o. female with medical history significant for CHF, COPD, chronic respiratory failure on 3 L, hypertension, multiple myeloma, GI bleed. Patient presented to the ED with complaints of 2 days of difficulty breathing and dry cough.  She also reports nausea and dizziness.  No chest pain.  She reports her baseline weight is about 398.  She does not check her weight regularly. She reports bilateral lower extremity swelling over the past several days. She has not been compliant with her Lasix , takes it on average 4 times a week, also on metolazone .  Patient had contact with her son-in-law who had COVID.  Over the past 2 weeks she has had black stools.  No gross blood in stools, no vomiting.  No abdominal pain.  She denies NSAID use.   ED Course: Temperature 100.  Heart rate 98-113.  Respiratory rate 20-27.  Blood pressure systolic 104-154.  O2 sats 91% on prior to admission on 3 L of O2.  O2 sats greater than 98% on 4 L. Lactic acid 1.2. BNP 55. Hemoglobin 6. X-ray-cardiomegaly with mild pulmonary vascular congestion. IV Lasix  40 mg x 1 given. Protonix  80 mg x 1 given. EDP talked to GI-follow, will see in consult, recommended starting ceftriaxone  and octreotide .  Assessment and plan 1-acute on chronic respiratory failure with hypoxia - Patient reports she uses 3 L supplementation at baseline - Requiring 4 L at time of admission to maintain saturation above 90%. - Patient exacerbation believed to be associated with decompensated CHF, mild COPD exacerbation, worsening anemia and also COVID infection. - Steroids has been started and IV diuresis initiated - 2 units of PRBCs has been order to be transfused - Will follow clinical response -  GI service have been consulted and will follow any further recommendations - Continue IV Protonix  and IV octreotide . - 2D echo will be updated. - Continue supportive care and follow response. - Bronchodilators, antitussives and the use of incentive spirometer has been ordered.  2-diabetes - Holding Ozempic  while inpatient - Continue sliding scale insulin  and follow CBG fluctuation.  3-hypertension - Continue current antihypertensive agents - Follow vital signs and adjust medication as needed.  4-COPD with mild exacerbation - Continue bronchodilator management and the use of steroids - Wean off oxygen  supplementation as tolerated.  5-history of liver cirrhosis/thrombocytopenia - Patient with prior history of esophageal varices and chronic thrombocytopenia - Avoid the use of heparin . - Continue PPI - Follow hemoglobin trend and transfuse as needed - Patient was also started on IV octreotide  and Rocephin  given the fight of acute bleeding concern - GI service has been consulted and will follow further recommendations. - Patient with history of medication noncompliance.  6-morbid obesity -Body mass index is 52.86 kg/m.  - Low-calorie diet and portion control discussed with patient.  Subjective:  Reports no chest pain, no nausea vomiting.  Afebrile and overall breathing better.  3.5 L nasal cannula supplementation in place at time of examination.  Physical Exam: Vitals:   12/25/23 1350 12/25/23 1351 12/25/23 1409 12/25/23 1718  BP: 111/66 111/66 (!) 102/55 138/75  Pulse: 91 91 91 84  Resp: 20  20 18   Temp: 98.5 F (36.9 C) 98.5 F (36.9 C) 98.4 F (36.9 C) 98.3 F (  36.8 C)  TempSrc: Oral Oral  Oral  SpO2:      Weight:      Height:       General exam: Alert, awake, oriented x 3; chronically ill and obese in appearance.  Following commands appropriately. Respiratory system: Decreased breath sounds at the bases; positive rhonchi and mild expiratory wheezing on  exam. Cardiovascular system:RRR. No rubs or gallops; unable to assess JVD with body habitus. Gastrointestinal system: Abdomen is obese, nondistended, soft and nontender.  Positive bowel sounds. Central nervous system: No focal neurological deficits. Extremities: No cyanosis or clubbing; 2-3+ edema appreciated bilaterally. Skin: No petechiae, chronic stasis dermatitis changes appreciated bilaterally. Psychiatry: Judgement and insight appear normal. Mood & affect appropriate.    Data Reviewed: LDH: 93 Basic metabolic panel: Sodium 142, potassium 3.8, chloride 101, bicarb 31, BUN 13, creatinine 0.55 and GFR >60 CBC: WBC 3.1, hemoglobin 6.0 and platelet count 89K  Family Communication: No family at bedside.  Disposition: Status is: Inpatient Remains inpatient appropriate because: IV therapy and IV diuresis.  Anticipating discharge back home once medically stable.  Time spent: 50 minutes  Author: Eric Nunnery, MD 12/25/2023 6:09 PM  For on call review [email or web address].

## 2023-12-26 ENCOUNTER — Telehealth: Payer: Self-pay | Admitting: *Deleted

## 2023-12-26 ENCOUNTER — Telehealth: Payer: Self-pay | Admitting: Gastroenterology

## 2023-12-26 ENCOUNTER — Inpatient Hospital Stay (HOSPITAL_COMMUNITY)

## 2023-12-26 DIAGNOSIS — I5033 Acute on chronic diastolic (congestive) heart failure: Secondary | ICD-10-CM | POA: Diagnosis not present

## 2023-12-26 DIAGNOSIS — I350 Nonrheumatic aortic (valve) stenosis: Secondary | ICD-10-CM

## 2023-12-26 DIAGNOSIS — J441 Chronic obstructive pulmonary disease with (acute) exacerbation: Secondary | ICD-10-CM | POA: Diagnosis not present

## 2023-12-26 DIAGNOSIS — J9621 Acute and chronic respiratory failure with hypoxia: Secondary | ICD-10-CM | POA: Diagnosis not present

## 2023-12-26 DIAGNOSIS — K7469 Other cirrhosis of liver: Secondary | ICD-10-CM | POA: Diagnosis not present

## 2023-12-26 LAB — BPAM RBC
Blood Product Expiration Date: 202510202359
Blood Product Expiration Date: 202510202359
ISSUE DATE / TIME: 202509291008
ISSUE DATE / TIME: 202509291343
Unit Type and Rh: 5100
Unit Type and Rh: 5100

## 2023-12-26 LAB — GLUCOSE, CAPILLARY
Glucose-Capillary: 191 mg/dL — ABNORMAL HIGH (ref 70–99)
Glucose-Capillary: 212 mg/dL — ABNORMAL HIGH (ref 70–99)
Glucose-Capillary: 225 mg/dL — ABNORMAL HIGH (ref 70–99)
Glucose-Capillary: 219 mg/dL — ABNORMAL HIGH (ref 70–99)
Glucose-Capillary: 222 mg/dL — ABNORMAL HIGH (ref 70–99)

## 2023-12-26 LAB — TYPE AND SCREEN
ABO/RH(D): A POS
Antibody Screen: POSITIVE
Unit division: 0
Unit division: 0

## 2023-12-26 LAB — ECHOCARDIOGRAM COMPLETE
AR max vel: 2.26 cm2
AV Area VTI: 2.63 cm2
AV Area mean vel: 2.29 cm2
AV Mean grad: 24.7 mmHg
AV Peak grad: 44.9 mmHg
Ao pk vel: 3.35 m/s
Area-P 1/2: 3.74 cm2
Height: 62 in
S' Lateral: 3 cm
Weight: 4529.13 [oz_av]

## 2023-12-26 LAB — CBC
HCT: 26.5 % — ABNORMAL LOW (ref 36.0–46.0)
Hemoglobin: 8 g/dL — ABNORMAL LOW (ref 12.0–15.0)
MCH: 29.9 pg (ref 26.0–34.0)
MCHC: 30.2 g/dL (ref 30.0–36.0)
MCV: 98.9 fL (ref 80.0–100.0)
Platelets: 104 K/uL — ABNORMAL LOW (ref 150–400)
RBC: 2.68 MIL/uL — ABNORMAL LOW (ref 3.87–5.11)
RDW: 18.1 % — ABNORMAL HIGH (ref 11.5–15.5)
WBC: 5.2 K/uL (ref 4.0–10.5)
nRBC: 0 % (ref 0.0–0.2)

## 2023-12-26 LAB — COMPREHENSIVE METABOLIC PANEL WITH GFR
ALT: 10 U/L (ref 0–44)
AST: 14 U/L — ABNORMAL LOW (ref 15–41)
Albumin: 2.8 g/dL — ABNORMAL LOW (ref 3.5–5.0)
Alkaline Phosphatase: 69 U/L (ref 38–126)
Anion gap: 9 (ref 5–15)
BUN: 16 mg/dL (ref 8–23)
CO2: 31 mmol/L (ref 22–32)
Calcium: 7.2 mg/dL — ABNORMAL LOW (ref 8.9–10.3)
Chloride: 100 mmol/L (ref 98–111)
Creatinine, Ser: 0.64 mg/dL (ref 0.44–1.00)
GFR, Estimated: >60 mL/min (ref >60)
Glucose, Bld: 216 mg/dL — ABNORMAL HIGH (ref 70–99)
Potassium: 3.5 mmol/L (ref 3.5–5.1)
Sodium: 140 mmol/L (ref 135–145)
Total Bilirubin: 0.7 mg/dL (ref 0.0–1.2)
Total Protein: 5 g/dL — ABNORMAL LOW (ref 6.5–8.1)

## 2023-12-26 NOTE — Telephone Encounter (Signed)
 Mindy/Autumn: We need official cardiac clearance (ECHO done while inpatient) prior to EGD with push enteroscopy as outpatient. Currently inpatient with Covid.   Please see if she can see cardiology for follow-up. Also needs appt with us  in 2-3 weeks to ensure she is ready for procedures.

## 2023-12-26 NOTE — Telephone Encounter (Addendum)
 Castaneda patient. Devere please schedule follow up per Therisa.   CARDIAC CLEARANCE ALSO SENT

## 2023-12-26 NOTE — Plan of Care (Signed)
  Problem: Education: Goal: Knowledge of General Education information will improve Description: Including pain rating scale, medication(s)/side effects and non-pharmacologic comfort measures Outcome: Progressing   Problem: Health Behavior/Discharge Planning: Goal: Ability to manage health-related needs will improve Outcome: Progressing   Problem: Clinical Measurements: Goal: Ability to maintain clinical measurements within normal limits will improve Outcome: Progressing Goal: Will remain free from infection Outcome: Progressing Goal: Diagnostic test results will improve Outcome: Progressing Goal: Respiratory complications will improve Outcome: Progressing Goal: Cardiovascular complication will be avoided Outcome: Progressing   Problem: Activity: Goal: Risk for activity intolerance will decrease Outcome: Progressing   Problem: Nutrition: Goal: Adequate nutrition will be maintained Outcome: Progressing   Problem: Coping: Goal: Level of anxiety will decrease Outcome: Progressing   Problem: Elimination: Goal: Will not experience complications related to bowel motility Outcome: Progressing Goal: Will not experience complications related to urinary retention Outcome: Progressing   Problem: Pain Managment: Goal: General experience of comfort will improve and/or be controlled Outcome: Progressing   Problem: Safety: Goal: Ability to remain free from injury will improve Outcome: Progressing   Problem: Skin Integrity: Goal: Risk for impaired skin integrity will decrease Outcome: Progressing   Problem: Education: Goal: Ability to describe self-care measures that may prevent or decrease complications (Diabetes Survival Skills Education) will improve Outcome: Progressing Goal: Individualized Educational Video(s) Outcome: Progressing   Problem: Coping: Goal: Ability to adjust to condition or change in health will improve Outcome: Progressing   Problem: Fluid  Volume: Goal: Ability to maintain a balanced intake and output will improve Outcome: Progressing   Problem: Health Behavior/Discharge Planning: Goal: Ability to identify and utilize available resources and services will improve Outcome: Progressing Goal: Ability to manage health-related needs will improve Outcome: Progressing   Problem: Metabolic: Goal: Ability to maintain appropriate glucose levels will improve Outcome: Progressing   Problem: Nutritional: Goal: Maintenance of adequate nutrition will improve Outcome: Progressing Goal: Progress toward achieving an optimal weight will improve Outcome: Progressing   Problem: Skin Integrity: Goal: Risk for impaired skin integrity will decrease Outcome: Progressing   Problem: Tissue Perfusion: Goal: Adequacy of tissue perfusion will improve Outcome: Progressing   Problem: Education: Goal: Ability to demonstrate management of disease process will improve Outcome: Progressing Goal: Ability to verbalize understanding of medication therapies will improve Outcome: Progressing Goal: Individualized Educational Video(s) Outcome: Progressing   Problem: Activity: Goal: Capacity to carry out activities will improve Outcome: Progressing   Problem: Cardiac: Goal: Ability to achieve and maintain adequate cardiopulmonary perfusion will improve Outcome: Progressing

## 2023-12-26 NOTE — Telephone Encounter (Signed)
 Although patient was previously cleared for this procedure during the last office visit, however he is currently in the hospital with acute on chronic respiratory distress.  Per internal medicine note from yesterday  exacerbation believed to be associated with decompensated CHF, mild COPD exacerbation, worsening anemia and also COVID infection.  He just received 2 units of packed red blood cell blood transfusion yesterday.  At this time, he is still requiring 4 L oxygen .  Although GI service was consulted during the hospitalization given anemia, however given the patient's overall respiratory status, it was recommended to delay his GI procedure as outpatient.  His overall condition needs to be reevaluated before clearance can be finalized.   Will follow along his overall progress and to make decision later.

## 2023-12-26 NOTE — Progress Notes (Addendum)
 This pt complained of chafed skin underneath abdomen folds. Requested Nystatin  powder from MD Segars. Pt tolerated well, stated the irritation was soothed from the application of powder. Will continue to monitor chafed area for further redness or skin breakdown.

## 2023-12-26 NOTE — Plan of Care (Signed)
  Problem: Health Behavior/Discharge Planning: Goal: Ability to manage health-related needs will improve Outcome: Progressing   Problem: Clinical Measurements: Goal: Ability to maintain clinical measurements within normal limits will improve Outcome: Progressing Goal: Will remain free from infection Outcome: Progressing   Problem: Activity: Goal: Risk for activity intolerance will decrease Outcome: Progressing   Problem: Coping: Goal: Level of anxiety will decrease Outcome: Progressing   Problem: Pain Managment: Goal: General experience of comfort will improve and/or be controlled Outcome: Progressing   Problem: Safety: Goal: Ability to remain free from injury will improve Outcome: Progressing   Problem: Skin Integrity: Goal: Risk for impaired skin integrity will decrease Outcome: Progressing   Problem: Coping: Goal: Ability to adjust to condition or change in health will improve Outcome: Progressing   Problem: Health Behavior/Discharge Planning: Goal: Ability to manage health-related needs will improve Outcome: Progressing   Problem: Tissue Perfusion: Goal: Adequacy of tissue perfusion will improve Outcome: Progressing   Problem: Education: Goal: Ability to demonstrate management of disease process will improve Outcome: Progressing

## 2023-12-26 NOTE — Progress Notes (Signed)
 Progress Note   Patient: Savannah Burns  [name] FMW:[id number] DOB: March 05, 1951 DOA: 12/24/2023     2 DOS: the patient was seen and examined on 12/26/2023   Brief hospital admission narrative: As per H&P written by Dr. Pearlean on 12/24/2023 Savannah  G Burns is a 73 y.o. female with medical history significant for CHF, COPD, chronic respiratory failure on 3 L, hypertension, multiple myeloma, GI bleed. Patient presented to the ED with complaints of 2 days of difficulty breathing and dry cough.  She also reports nausea and dizziness.  No chest pain.  She reports her baseline weight is about 398.  She does not check her weight regularly. She reports bilateral lower extremity swelling over the past several days. She has not been compliant with her Lasix , takes it on average 4 times a week, also on metolazone .  Patient had contact with her son-in-law who had COVID.  Over the past 2 weeks she has had black stools.  No gross blood in stools, no vomiting.  No abdominal pain.  She denies NSAID use.   ED Course: Temperature 100.  Heart rate 98-113.  Respiratory rate 20-27.  Blood pressure systolic 104-154.  O2 sats 91% on prior to admission on 3 L of O2.  O2 sats greater than 98% on 4 L. Lactic acid 1.2. BNP 55. Hemoglobin 6. X-ray-cardiomegaly with mild pulmonary vascular congestion. IV Lasix  40 mg x 1 given. Protonix  80 mg x 1 given. EDP talked to GI-follow, will see in consult, recommended starting ceftriaxone  and octreotide .  Assessment and plan 1-acute on chronic respiratory failure with hypoxia - Patient reports she uses 3 L supplementation at baseline - Requiring 4 L at time of admission to maintain saturation above 90%.  After diuresis and transfusion oxygen  supplementation down to baseline (3 L). - Patient exacerbation believed to be associated with decompensated CHF, mild COPD exacerbation, worsening anemia and also COVID infection. - Steroids has been started and IV diuresis initiated - Status  post 2 units of PRBCs transfusion on 12/25/2023; hemoglobin up to 8.0 -Continue to follow hemoglobin trend. - GI service have been consulted and will follow any further recommendations - Continue IV Protonix  and IV octreotide . - 2D echo demonstrating ejection fraction 60 to 65%, grade 1 diastolic dysfunction and mild left ventricle hypertrophy.  No wall motion abnormalities. - Continue supportive care and follow response. - Continue bronchodilators, antitussives and the use of incentive spirometer as ordered.  2-diabetes - Holding Ozempic  while inpatient - Continue sliding scale insulin  and follow CBG fluctuation.  3-hypertension - Continue current antihypertensive agents - Follow vital signs and adjust medication as needed.  4-COPD with mild exacerbation - Continue bronchodilator management and the use of steroids - Oxygen  supplementation down to 3 L (baseline). - No significant wheezing on today's examination.  5-history of liver cirrhosis/thrombocytopenia - Patient with prior history of esophageal varices and chronic thrombocytopenia - Avoid the use of heparin . - Continue PPI - Follow hemoglobin trend and transfuse as needed - Patient was also started on IV octreotide  and Rocephin  given the fight of acute bleeding concern - GI service has been consulted and will follow further recommendations. - Patient with history of medication noncompliance.  6-morbid obesity -Body mass index is 51.77 kg/m.  - Low-calorie diet and portion control discussed with patient.  Subjective:  No fever, no chest pain, no nausea, no vomiting.  Reports good urine output and expressed no overt bleeding overnight.  Patient is breathing easier and feeling better.  Physical Exam: Vitals:  12/25/23 1718 12/25/23 2037 12/26/23 0449 12/26/23 1246  BP: 138/75 136/73 131/77 (!) 152/48  Pulse: 84 92 95 81  Resp: 18 18  20   Temp: 98.3 F (36.8 C) 98.5 F (36.9 C) 97.6 F (36.4 C) 98.4 F (36.9 C)   TempSrc: Oral Oral Oral Oral  SpO2:  97% 94% 98%  Weight:   128.4 kg   Height:       General exam: Alert, awake, oriented x 3; afebrile, reports breathing better and expressing good urine output.  No chest pain. no overt bleeding appreciated overnight. Respiratory system: Good saturation on chronic supplementation (3 L nasal cannula); no using accessory muscle.  Decreased breath sounds at the bases. Cardiovascular system: Rate controlled, no rubs, no gallops, unable to assess JVD with body habitus. Gastrointestinal system: Abdomen is obese, nondistended, soft and nontender. No organomegaly or masses felt. Normal bowel sounds heard. Central nervous system: Moving 4 limbs spontaneously.  No focal neurological deficits. Extremities: No cyanosis or clubbing; 2+ edema appreciated bilaterally. Skin: No petechiae.  Chronic stasis dermatitis changes appreciated bilaterally.  No open wounds or drainage. Psychiatry: Judgement and insight appear normal. Mood & affect appropriate.   Data Reviewed: LDH: 93 CBC: White blood cell 5.2, hemoglobin 8.0, platelet count 104K Comprehensive metabolic panel: Sodium 140, potassium 3.5, chloride 100, bicarb 31, BUN 16, creatinine 0.64, albumin 2.8, AST 14, ALT 10 and GFR >60  Family Communication: No family at bedside.  Disposition: Status is: Inpatient Remains inpatient appropriate because: IV therapy and IV diuresis.  Anticipating discharge back home once medically stable.  Time spent: 50 minutes  Author: Eric Nunnery, MD 12/26/2023 5:19 PM  For on call review [email or web address].

## 2023-12-26 NOTE — Progress Notes (Addendum)
 Reviewed chart. Hgb 8.0. Does not appear to be consistent with variceal bleed. Recommend IV BID PPI. ECHO 60 to 65%, mild to moderate aortic stenosis noted.  Recommend outpatient procedures unless urgent EGD is needed. Needs to recover from cardiopulmonary standpoint first. Following peripherally.  It is important she has cardiology follow-up for clearance prior to arranging procedures. WIll arrange cardiac clearance follow-up and visit with our team as well as outpatient.   Therisa MICAEL Stager, PhD, ANP-BC [facility]

## 2023-12-26 NOTE — Telephone Encounter (Signed)
    12/26/23  Savannah  G Burns 04/12/1950  What type of surgery is being performed? EGD/PUSHENTEROSCOPY  When is surgery scheduled? TBD  What type of clearance is required (medical or pharmacy to hold medication or both? Medical  Name of physician performing surgery?  Dr. Eartha Rouse [facility] at [facility] Phone: [phone] Fax: [phone]  Anethesia type (none, local, MAC, general)? MAC

## 2023-12-27 DIAGNOSIS — J9621 Acute and chronic respiratory failure with hypoxia: Secondary | ICD-10-CM | POA: Diagnosis not present

## 2023-12-27 LAB — BASIC METABOLIC PANEL WITH GFR
Anion gap: 11 (ref 5–15)
BUN: 18 mg/dL (ref 8–23)
CO2: 29 mmol/L (ref 22–32)
Calcium: 8 mg/dL — ABNORMAL LOW (ref 8.9–10.3)
Chloride: 105 mmol/L (ref 98–111)
Creatinine, Ser: 0.54 mg/dL (ref 0.44–1.00)
GFR, Estimated: >60 mL/min (ref >60)
Glucose, Bld: 197 mg/dL — ABNORMAL HIGH (ref 70–99)
Potassium: 3.7 mmol/L (ref 3.5–5.1)
Sodium: 144 mmol/L (ref 135–145)

## 2023-12-27 LAB — GLUCOSE, CAPILLARY
Glucose-Capillary: 203 mg/dL — ABNORMAL HIGH (ref 70–99)
Glucose-Capillary: 204 mg/dL — ABNORMAL HIGH (ref 70–99)
Glucose-Capillary: 169 mg/dL — ABNORMAL HIGH (ref 70–99)
Glucose-Capillary: 231 mg/dL — ABNORMAL HIGH (ref 70–99)

## 2023-12-27 LAB — CBC
HCT: 26.8 % — ABNORMAL LOW (ref 36.0–46.0)
Hemoglobin: 7.9 g/dL — ABNORMAL LOW (ref 12.0–15.0)
MCH: 29.2 pg (ref 26.0–34.0)
MCHC: 29.5 g/dL — ABNORMAL LOW (ref 30.0–36.0)
MCV: 98.9 fL (ref 80.0–100.0)
Platelets: 103 K/uL — ABNORMAL LOW (ref 150–400)
RBC: 2.71 MIL/uL — ABNORMAL LOW (ref 3.87–5.11)
RDW: 17.2 % — ABNORMAL HIGH (ref 11.5–15.5)
WBC: 4.9 K/uL (ref 4.0–10.5)
nRBC: 0 % (ref 0.0–0.2)

## 2023-12-27 MED ORDER — FUROSEMIDE 10 MG/ML IJ SOLN
40.0000 mg | Freq: Every day | INTRAMUSCULAR | Status: DC
  Administered 2023-12-27: 40 mg via INTRAVENOUS
  Filled 2023-12-27 (×2): qty 4

## 2023-12-27 NOTE — Plan of Care (Signed)
  Problem: Clinical Measurements: Goal: Will remain free from infection Outcome: Progressing Goal: Respiratory complications will improve Outcome: Progressing   Problem: Activity: Goal: Risk for activity intolerance will decrease Outcome: Progressing   Problem: Pain Managment: Goal: General experience of comfort will improve and/or be controlled Outcome: Progressing   Problem: Skin Integrity: Goal: Risk for impaired skin integrity will decrease Outcome: Progressing   Problem: Health Behavior/Discharge Planning: Goal: Ability to identify and utilize available resources and services will improve Outcome: Progressing   Problem: Skin Integrity: Goal: Risk for impaired skin integrity will decrease Outcome: Progressing

## 2023-12-27 NOTE — Hospital Course (Addendum)
 Savannah Burns  TAMALA MANZER is a 73 y.o. female with medical history significant for CHF, COPD, chronic respiratory failure on 3 L, hypertension, multiple myeloma, GI bleed. Patient presented to the ED with complaints of 2 days of difficulty breathing and dry cough.  She also reports nausea and dizziness.  No chest pain.  She reports her baseline weight is about 398.  She does not check her weight regularly. She reports bilateral lower extremity swelling over the past several days. She has not been compliant with her Lasix , takes it on average 4 times a week, also on metolazone .  Patient had contact with her son-in-law who had COVID.  Over the past 2 weeks she has had black stools.  No gross blood in stools, no vomiting.  No abdominal pain.  She denies NSAID use.   ED Course: Temp.  100.  Heart rate 98-113. RR 20-27.  Blood pressure systolic 104-154.  O2 sats 91% on prior to admission on 3 L of O2.  O2 sats greater than 98% on 4 L. Lactic acid 1.2. BNP 55. Hemoglobin 6. X-ray-cardiomegaly with mild pulmonary vascular congestion. IV Lasix  40 mg x 1 given. Protonix  80 mg x 1 given. EDP talked to GI-follow, will see in consult, recommended starting ceftriaxone  and octreotide .    Assessment & Plan:   Principal Problem:   Acute on chronic respiratory failure with hypoxia (HCC) Active Problems:   Acute on chronic anemia   Acute on chronic congestive heart failure (HCC)   COVID-19 virus infection   Diabetes mellitus, type 2 (HCC)   COPD (chronic obstructive pulmonary disease) (HCC)   Hypertension associated with diabetes (HCC)   Cirrhosis of liver (HCC)      Assessment and plan   Acute on chronic respiratory failure with hypoxia - Much improved, currently satting 100% on 3 L of oxygen  - Patient reports she uses 3 L supplementation at baseline  - On admission required up to 4 L of oxygen   - Patient exacerbation believed to be associated with decompensated CHF, mild COPD exacerbation, worsening  anemia and also COVID infection.  -On steroids and IV diuretics >>> tapering off   -GI/cardiology does not recommend inpatient EGD or colonoscopy due to patient's acute respiratory distress - Continue IV Protonix  and IV octreotide . - 2D echo demonstrating ejection fraction 60 to 65%, grade 1 diastolic dysfunction and mild left ventricle hypertrophy.  No wall motion abnormalities. - Continue supportive care and follow response. - Continue bronchodilators, antitussives and the use of incentive spirometer as ordered.   Diabetes - Holding Ozempic  while inpatient - Continue sliding scale insulin  and follow CBG fluctuation.   Hypertension - Continue current antihypertensive agents - Follow vital signs and adjust medication as needed.   COPD with mild exacerbation - Continue bronchodilator management and the use of steroids - Oxygen  supplementation down to 3 L (baseline). - No significant wheezing on today's examination.   Acute on chronic anemia- pancytopenia with  History of liver cirrhosis/thrombocytopenia - Patient with prior history of esophageal varices and chronic thrombocytopenia - Avoid the use of heparin . - Continue PPI - Follow hemoglobin trend a, s/p 2U PRBC blood transfusion 12/26/2023  -Hemoglobin 6.0, 8.0, 7.9 today  - Patient was also started on IV octreotide  and Rocephin  given the fight of acute bleeding concern - GI service has been consulted  Based on patient's current symptoms, patient is deemed not stable for intervention with anesthesia for EGD/colonoscopy (may follow-up as an outpatient)  - Patient with history of medication noncompliance.  Morbid obesity -Body mass index is 51.77 kg/m.  - Low-calorie diet and portion control discussed with patient.

## 2023-12-27 NOTE — Progress Notes (Signed)
 PROGRESS NOTE    Patient: Savannah Burns                            PCP: Jolinda Norene HERO, DO                    DOB: 1950/11/14            DOA: 12/24/2023 FMW:[id number]             DOS: 12/27/2023, 11:57 AM   LOS: 3 days   Date of Service: The patient was seen and examined on 12/27/2023  Subjective:   The patient was seen and examined this morning. Hemodynamically stable.  Satting 100% on 3 L of oxygen  No issues overnight .  Brief Narrative:   Savannah Burns is a 73 y.o. female with medical history significant for CHF, COPD, chronic respiratory failure on 3 L, hypertension, multiple myeloma, GI bleed. Patient presented to the ED with complaints of 2 days of difficulty breathing and dry cough.  She also reports nausea and dizziness.  No chest pain.  She reports her baseline weight is about 398.  She does not check her weight regularly. She reports bilateral lower extremity swelling over the past several days. She has not been compliant with her Lasix , takes it on average 4 times a week, also on metolazone .  Patient had contact with her son-in-law who had COVID.  Over the past 2 weeks she has had black stools.  No gross blood in stools, no vomiting.  No abdominal pain.  She denies NSAID use.   ED Course: Temp.  100.  Heart rate 98-113. RR 20-27.  Blood pressure systolic 104-154.  O2 sats 91% on prior to admission on 3 L of O2.  O2 sats greater than 98% on 4 L. Lactic acid 1.2. BNP 55. Hemoglobin 6. X-ray-cardiomegaly with mild pulmonary vascular congestion. IV Lasix  40 mg x 1 given. Protonix  80 mg x 1 given. EDP talked to GI-follow, will see in consult, recommended starting ceftriaxone  and octreotide .    Assessment & Plan:   Principal Problem:   Acute on chronic respiratory failure with hypoxia (HCC) Active Problems:   Acute on chronic anemia   Acute on chronic congestive heart failure (HCC)   COVID-19 virus infection   Diabetes mellitus, type 2 (HCC)   COPD  (chronic obstructive pulmonary disease) (HCC)   Hypertension associated with diabetes (HCC)   Cirrhosis of liver (HCC)      Assessment and plan   Acute on chronic respiratory failure with hypoxia - Much improved, currently satting 100% on 3 L of oxygen  - Patient reports she uses 3 L supplementation at baseline  - On admission required up to 4 L of oxygen   - Patient exacerbation believed to be associated with decompensated CHF, mild COPD exacerbation, worsening anemia and also COVID infection.  -On steroids and IV diuretics >>> tapering off   -GI/cardiology does not recommend inpatient EGD or colonoscopy due to patient's acute respiratory distress - Continue IV Protonix  and IV octreotide . - 2D echo demonstrating ejection fraction 60 to 65%, grade 1 diastolic dysfunction and mild left ventricle hypertrophy.  No wall motion abnormalities. - Continue supportive care and follow response. - Continue bronchodilators, antitussives and the use of incentive spirometer as ordered.   Diabetes - Holding Ozempic  while inpatient - Continue sliding scale insulin  and follow CBG fluctuation.   Hypertension - Continue current antihypertensive agents - Follow vital  signs and adjust medication as needed.   COPD with mild exacerbation - Continue bronchodilator management and the use of steroids - Oxygen  supplementation down to 3 L (baseline). - No significant wheezing on today's examination.   Acute on chronic anemia- pancytopenia with  History of liver cirrhosis/thrombocytopenia - Patient with prior history of esophageal varices and chronic thrombocytopenia - Avoid the use of heparin . - Continue PPI - Follow hemoglobin trend a, s/p 2U PRBC blood transfusion 12/26/2023  -Hemoglobin 6.0, 8.0, 7.9 today  - Patient was also started on IV octreotide  and Rocephin  given the fight of acute bleeding concern - GI service has been consulted  Based on patient's current symptoms, patient is deemed  not stable for intervention with anesthesia for EGD/colonoscopy (may follow-up as an outpatient)  - Patient with history of medication noncompliance.   Morbid obesity -Body mass index is 51.77 kg/m.  - Low-calorie diet and portion control discussed with patient.       ----------------------------------------------------------------------------------------------------------------------------------------------- Nutritional status:  The patient's BMI is: Body mass index is 52.34 kg/m. I agree with the assessment and plan as outlined ----------------------------------------------------------------------------------------------------  DVT prophylaxis:  SCDs Start: 12/25/23 0332   Code Status:   Code Status: Full Code  Family Communication: No family member present at bedside-  -Advance care planning has been discussed.   Admission status:   Status is: Inpatient Remains inpatient appropriate because: Treating acute on chronic respiratory failure monitoring for GI bleed,   Disposition: From  - home             Planning for discharge in 1-2 days   Procedures:   No admission procedures for hospital encounter.   Antimicrobials:  Anti-infectives (From admission, onward)    Start     Dose/Rate Route Frequency Ordered Stop   12/25/23 2000  cefTRIAXone  (ROCEPHIN ) 2 g in sodium chloride  0.9 % 100 mL IVPB        2 g 200 mL/hr over 30 Minutes Intravenous Every 24 hours 12/25/23 0331     12/24/23 2000  cefTRIAXone  (ROCEPHIN ) 1 g in sodium chloride  0.9 % 100 mL IVPB        1 g 200 mL/hr over 30 Minutes Intravenous  Once 12/24/23 1956 12/24/23 2047        Medication:   sodium chloride    Intravenous Once   Chlorhexidine  Gluconate Cloth  6 each Topical Daily   fluticasone  furoate-vilanterol  1 puff Inhalation Daily   insulin  aspart  0-15 Units Subcutaneous TID WC   insulin  aspart  0-5 Units Subcutaneous QHS   nystatin    Topical TID   pantoprazole  (PROTONIX ) IV  40 mg  Intravenous Q12H   [START ON 12/28/2023] predniSONE   50 mg Oral Daily   sodium chloride  flush  10-40 mL Intracatheter Q12H    acetaminophen  **OR** acetaminophen , albuterol , ondansetron  **OR** ondansetron  (ZOFRAN ) IV, polyethylene glycol, sodium chloride  flush   Objective:   Vitals:   12/26/23 1942 12/27/23 0429 12/27/23 0840 12/27/23 0952  BP: (!) 144/77 128/61  (!) 123/58  Pulse: 88 73  80  Resp: 20     Temp: 98.6 F (37 C) 98.6 F (37 C)  97.7 F (36.5 C)  TempSrc: Oral Oral  Oral  SpO2: 100% 100% 98% 100%  Weight:  129.8 kg    Height:        Intake/Output Summary (Last 24 hours) at 12/27/2023 1157 Last data filed at 12/27/2023 0640 Gross per 24 hour  Intake 610 ml  Output 300 ml  Net 310  ml   Filed Weights   12/25/23 0335 12/26/23 0449 12/27/23 0429  Weight: 131.1 kg 128.4 kg 129.8 kg     Physical examination:   General:  AAO x 3,  cooperative, no distress;   HEENT:  Normocephalic, PERRL, otherwise with in Normal limits   Neuro:  CNII-XII intact. , normal motor and sensation, reflexes intact   Lungs:   Clear to auscultation BL, Respirations unlabored,  No wheezes / crackles  Cardio:    S1/S2, RRR, No murmure, No Rubs or Gallops   Abdomen:  Soft, non-tender, bowel sounds active all four quadrants, no guarding or peritoneal signs.  Muscular  skeletal:  Limited exam -global generalized weaknesses - in bed, able to move all 4 extremities,   2+ pulses,  symmetric, No pitting edema  Skin:  Dry, warm to touch, negative for any Rashes,  Wounds: Please see nursing documentation       ------------------------------------------------------------------------------------------------------------------------------------------    LABs:     Latest Ref Rng & Units 12/27/2023    5:13 AM 12/26/2023    4:37 AM 12/25/2023    5:15 AM  CBC  WBC 4.0 - 10.5 K/uL 4.9  5.2  3.1   Hemoglobin 12.0 - 15.0 g/dL 7.9  8.0  6.0   Hematocrit 36.0 - 46.0 % 26.8  26.5  21.4   Platelets  150 - 400 K/uL 103  104  89       Latest Ref Rng & Units 12/27/2023    5:13 AM 12/26/2023    4:37 AM 12/25/2023    5:04 AM  CMP  Glucose 70 - 99 mg/dL 802  783  818   BUN 8 - 23 mg/dL 18  16  13    Creatinine 0.44 - 1.00 mg/dL 9.45  9.35  9.44   Sodium 135 - 145 mmol/L 144  140  142   Potassium 3.5 - 5.1 mmol/L 3.7  3.5  3.8   Chloride 98 - 111 mmol/L 105  100  101   CO2 22 - 32 mmol/L 29  31  31    Calcium  8.9 - 10.3 mg/dL 8.0  7.2  7.8   Total Protein 6.5 - 8.1 g/dL  5.0    Total Bilirubin 0.0 - 1.2 mg/dL  0.7    Alkaline Phos 38 - 126 U/L  69    AST 15 - 41 U/L  14    ALT 0 - 44 U/L  10         Micro Results Recent Results (from the past 240 hours)  Resp panel by RT-PCR (RSV, Flu A&B, Covid) Anterior Nasal Swab     Status: Abnormal   Collection Time: 12/24/23  4:48 PM   Specimen: Anterior Nasal Swab  Result Value Ref Range Status   SARS Coronavirus 2 by RT PCR POSITIVE (A) NEGATIVE Final    Comment: (NOTE) SARS-CoV-2 target nucleic acids are DETECTED.  The SARS-CoV-2 RNA is generally detectable in upper respiratory specimens during the acute phase of infection. Positive results are indicative of the presence of the identified virus, but do not rule out bacterial infection or co-infection with other pathogens not detected by the test. Clinical correlation with patient history and other diagnostic information is necessary to determine patient infection status. The expected result is Negative.  Fact Sheet for Patients: BloggerCourse.com  Fact Sheet for Healthcare Providers: SeriousBroker.it  This test is not yet approved or cleared by the [place]  FDA and  has been authorized for detection and/or diagnosis of  SARS-CoV-2 by FDA under an Emergency Use Authorization (EUA).  This EUA will remain in effect (meaning this test can be used) for the duration of  the COVID-19 declaration under Section 564(b)(1) of the A ct,  21 U.S.C. section 360bbb-3(b)(1), unless the authorization is terminated or revoked sooner.     Influenza A by PCR NEGATIVE NEGATIVE Final   Influenza B by PCR NEGATIVE NEGATIVE Final    Comment: (NOTE) The Xpert Xpress SARS-CoV-2/FLU/RSV plus assay is intended as an aid in the diagnosis of influenza from Nasopharyngeal swab specimens and should not be used as a sole basis for treatment. Nasal washings and aspirates are unacceptable for Xpert Xpress SARS-CoV-2/FLU/RSV testing.  Fact Sheet for Patients: BloggerCourse.com  Fact Sheet for Healthcare Providers: SeriousBroker.it  This test is not yet approved or cleared by the [place]  FDA and has been authorized for detection and/or diagnosis of SARS-CoV-2 by FDA under an Emergency Use Authorization (EUA). This EUA will remain in effect (meaning this test can be used) for the duration of the COVID-19 declaration under Section 564(b)(1) of the Act, 21 U.S.C. section 360bbb-3(b)(1), unless the authorization is terminated or revoked.     Resp Syncytial Virus by PCR NEGATIVE NEGATIVE Final    Comment: (NOTE) Fact Sheet for Patients: BloggerCourse.com  Fact Sheet for Healthcare Providers: SeriousBroker.it  This test is not yet approved or cleared by the [place]  FDA and has been authorized for detection and/or diagnosis of SARS-CoV-2 by FDA under an Emergency Use Authorization (EUA). This EUA will remain in effect (meaning this test can be used) for the duration of the COVID-19 declaration under Section 564(b)(1) of the Act, 21 U.S.C. section 360bbb-3(b)(1), unless the authorization is terminated or revoked.  Performed at [facility], [address] [place], [place] [address]   Culture, blood (Routine x 2)     Status: None (Preliminary result)   Collection Time: 12/24/23  5:13 PM   Specimen: Porta Cath; Blood  Result  Value Ref Range Status   Specimen Description PORTA CATH BOTTLES DRAWN AEROBIC AND ANAEROBIC  Final   Special Requests Blood Culture adequate volume  Final   Culture   Final    NO GROWTH 3 DAYS Performed at [facility], [address], [place], [place] [address]    Report Status PENDING  Incomplete  Culture, blood (Routine x 2)     Status: None (Preliminary result)   Collection Time: 12/24/23  5:13 PM   Specimen: BLOOD  Result Value Ref Range Status   Specimen Description BLOOD BLOOD RIGHT ARM  Final   Special Requests NONE  Final   Culture   Final    NO GROWTH 3 DAYS Performed at [facility], [address] [place], [place] [address]    Report Status PENDING  Incomplete    Radiology Reports No results found.   SIGNED: Adriana DELENA Grams, MD, FHM. FAAFP. Jolynn Pack - [facility] Time spent - 55 min.  In seeing, evaluating and examining the patient. Reviewing medical records, labs, drawn plan of care. [facility],  Pager (please use [email or web address] to page/ text) Please use Epic Secure Chat for non-urgent communication (7AM-7PM)  If 7PM-7AM, please contact night-coverage [email or web address], 12/27/2023, 11:57 AM

## 2023-12-27 NOTE — Plan of Care (Signed)
   Problem: Education: Goal: Knowledge of General Education information will improve Description Including pain rating scale, medication(s)/side effects and non-pharmacologic comfort measures Outcome: Progressing   Problem: Education: Goal: Knowledge of General Education information will improve Description Including pain rating scale, medication(s)/side effects and non-pharmacologic comfort measures Outcome: Progressing

## 2023-12-28 DIAGNOSIS — J9621 Acute and chronic respiratory failure with hypoxia: Secondary | ICD-10-CM | POA: Diagnosis not present

## 2023-12-28 LAB — BASIC METABOLIC PANEL WITH GFR
Anion gap: 9 (ref 5–15)
BUN: 21 mg/dL (ref 8–23)
CO2: 32 mmol/L (ref 22–32)
Calcium: 8.4 mg/dL — ABNORMAL LOW (ref 8.9–10.3)
Chloride: 103 mmol/L (ref 98–111)
Creatinine, Ser: 0.55 mg/dL (ref 0.44–1.00)
GFR, Estimated: >60 mL/min (ref >60)
Glucose, Bld: 158 mg/dL — ABNORMAL HIGH (ref 70–99)
Potassium: 3.4 mmol/L — ABNORMAL LOW (ref 3.5–5.1)
Sodium: 144 mmol/L (ref 135–145)

## 2023-12-28 LAB — GLUCOSE, CAPILLARY: Glucose-Capillary: 179 mg/dL — ABNORMAL HIGH (ref 70–99)

## 2023-12-28 LAB — CBC
HCT: 30.4 % — ABNORMAL LOW (ref 36.0–46.0)
Hemoglobin: 8.8 g/dL — ABNORMAL LOW (ref 12.0–15.0)
MCH: 28.9 pg (ref 26.0–34.0)
MCHC: 28.9 g/dL — ABNORMAL LOW (ref 30.0–36.0)
MCV: 99.7 fL (ref 80.0–100.0)
Platelets: 108 K/uL — ABNORMAL LOW (ref 150–400)
RBC: 3.05 MIL/uL — ABNORMAL LOW (ref 3.87–5.11)
RDW: 16.8 % — ABNORMAL HIGH (ref 11.5–15.5)
WBC: 5.7 K/uL (ref 4.0–10.5)
nRBC: 0 % (ref 0.0–0.2)

## 2023-12-28 MED ORDER — HEPARIN SOD (PORK) LOCK FLUSH 100 UNIT/ML IV SOLN
500.0000 [IU] | Freq: Once | INTRAVENOUS | Status: AC
  Administered 2023-12-28: 500 [IU] via INTRAVENOUS
  Filled 2023-12-28: qty 5

## 2023-12-28 MED ORDER — NYSTATIN 100000 UNIT/GM EX POWD: 1.0000 | Freq: Two times a day (BID) | CUTANEOUS | 1 refills | Status: AC

## 2023-12-28 MED ORDER — POTASSIUM CHLORIDE CRYS ER 20 MEQ PO TBCR
40.0000 meq | EXTENDED_RELEASE_TABLET | Freq: Once | ORAL | Status: AC
  Administered 2023-12-28: 40 meq via ORAL
  Filled 2023-12-28: qty 2

## 2023-12-28 MED ORDER — GUAIFENESIN-DM 100-10 MG/5ML PO SYRP
10.0000 mL | ORAL_SOLUTION | ORAL | 0 refills | Status: DC | PRN
Start: 2023-12-28 — End: 2024-01-10

## 2023-12-28 MED ORDER — FERROUS SULFATE 325 (65 FE) MG PO TABS: 325.0000 mg | ORAL_TABLET | Freq: Every day | ORAL | 3 refills | Status: AC

## 2023-12-28 MED ORDER — FUROSEMIDE 40 MG PO TABS
40.0000 mg | ORAL_TABLET | Freq: Every day | ORAL | Status: DC
  Administered 2023-12-28: 40 mg via ORAL
  Filled 2023-12-28: qty 1

## 2023-12-28 MED ORDER — METHYLPREDNISOLONE 4 MG PO TBPK: ORAL_TABLET | ORAL | 0 refills | Status: DC

## 2023-12-28 MED ORDER — PANTOPRAZOLE SODIUM 40 MG PO TBEC: 40.0000 mg | DELAYED_RELEASE_TABLET | Freq: Two times a day (BID) | ORAL | 0 refills | Status: AC

## 2023-12-28 NOTE — Discharge Summary (Signed)
 Physician Discharge Summary   Patient: Savannah Burns MRN: [id number] DOB: 15-May-1950  Admit date:     12/24/2023  Discharge date: 12/28/23  Discharge Physician: Adriana DELENA Grams   PCP: Jolinda Norene HERO, DO   Recommendations at discharge:    Follow with a gastroenterologist in 1-2 weeks CBC, CMP in 1 weeks, results to PCP and gastroenterologist Follow-up with PCP in 1 week Follow-up with oncologist as scheduled Continue current medications  Discharge Diagnoses: Principal Problem:   Acute on chronic respiratory failure with hypoxia (HCC) Active Problems:   Acute on chronic anemia   Acute on chronic congestive heart failure (HCC)   COVID-19 virus infection   Diabetes mellitus, type 2 (HCC)   COPD (chronic obstructive pulmonary disease) (HCC)   Hypertension associated with diabetes (HCC)   Cirrhosis of liver (HCC)  Resolved Problems:   * No resolved hospital problems. *  Hospital Course: Savannah  MARTENA Burns is a 73 y.o. female with medical history significant for CHF, COPD, chronic respiratory failure on 3 L, hypertension, multiple myeloma, GI bleed. Patient presented to the ED with complaints of 2 days of difficulty breathing and dry cough.  She also reports nausea and dizziness.  No chest pain.  She reports her baseline weight is about 398.  She does not check her weight regularly. She reports bilateral lower extremity swelling over the past several days. She has not been compliant with her Lasix , takes it on average 4 times a week, also on metolazone .  Patient had contact with her son-in-law who had COVID.  Over the past 2 weeks she has had black stools.  No gross blood in stools, no vomiting.  No abdominal pain.  She denies NSAID use.   ED Course: Temp.  100.  Heart rate 98-113. RR 20-27.  Blood pressure systolic 104-154.  O2 sats 91% on prior to admission on 3 L of O2.  O2 sats greater than 98% on 4 L. Lactic acid 1.2. BNP 55. Hemoglobin 6. X-ray-cardiomegaly with  mild pulmonary vascular congestion. IV Lasix  40 mg x 1 given. Protonix  80 mg x 1 given. EDP talked to GI-follow, will see in consult, recommended starting ceftriaxone  and octreotide .    Acute on chronic respiratory failure with hypoxia - Much improved, currently satting 100% on 3 L of oxygen  - Patient reports she uses 3 L supplementation at baseline - On admission required up to 4 L of oxygen   - Patient exacerbation believed to be associated with decompensated CHF, mild COPD exacerbation, worsening anemia and also COVID infection.  -On steroids and IV diuretics >>> changing to p.o. -steroids will be tapered off as an outpatient   -GI/cardiology does not recommend inpatient EGD or colonoscopy due to patient's acute respiratory distress - Continue IV Protonix  and IV octreotide . - 2D echo demonstrating ejection fraction 60 to 65%, grade 1 diastolic dysfunction and mild left ventricle hypertrophy.  No wall motion abnormalities.  - Continue bronchodilators, antitussives and the use of incentive spirometer as ordered.   Diabetes - Continue Ozempic  while inpatient - Continue sliding scale insulin  and follow CBG fluctuation.   Hypertension - Continue current antihypertensive agents - Follow vital signs and adjust medication as needed.   COPD with mild exacerbation - Continue bronchodilator management and the use of steroids - Oxygen  supplementation down to 3 L (baseline). - No significant wheezing on today's examination.   Acute on chronic anemia- pancytopenia with  History of liver cirrhosis/thrombocytopenia - Patient with prior history of esophageal varices and chronic thrombocytopenia -  Avoid the use of heparin . - Continue PPI - Follow hemoglobin trend a, s/p 2U PRBC blood transfusion 12/26/2023    Latest Ref Rng & Units 12/28/2023    4:30 AM 12/27/2023    5:13 AM 12/26/2023    4:37 AM  CBC  WBC 4.0 - 10.5 K/uL 5.7  4.9  5.2   Hemoglobin 12.0 - 15.0 g/dL 8.8  7.9  8.0    Hematocrit 36.0 - 46.0 % 30.4  26.8  26.5   Platelets 150 - 400 K/uL 108  103  104    - Patient was also started on IV octreotide  and Rocephin  given the fight of acute bleeding concern Discontinued IV Rocephin  and octreotide , H&H has stabilized - GI service has been consulted -with consent from cardiology, decided to defer further GI workup such as EGD colonoscopy as an outpatient once her respiratory status stabilized    Morbid obesity  - Low-calorie diet and portion control discussed with patient.      Consultants: Gastroenterologist cardiology Procedures performed: 2 units of blood transfusion Disposition: Home Diet recommendation:  Discharge Diet Orders (From admission, onward)     Start     Ordered   12/28/23 0000  Diet - low sodium heart healthy        12/28/23 1017   12/28/23 0000  Diet Carb Modified        12/28/23 1017           Cardiac and Carb modified diet DISCHARGE MEDICATION: Allergies as of 12/28/2023       Reactions   Keflex  [cephalexin ] Nausea And Vomiting        Medication List     STOP taking these medications    dexamethasone  4 MG tablet Commonly known as: DECADRON    esomeprazole  40 MG capsule Commonly known as: NexIUM    ibuprofen 200 MG tablet Commonly known as: ADVIL       TAKE these medications    acyclovir  400 MG tablet Commonly known as: ZOVIRAX  Take 1 tablet (400 mg total) by mouth 2 (two) times daily.   albuterol  (2.5 MG/3ML) 0.083% nebulizer solution Commonly known as: PROVENTIL  Take 3 mLs (2.5 mg total) by nebulization every 4 (four) hours as needed for wheezing or shortness of breath.   albuterol  108 (90 Base) MCG/ACT inhaler Commonly known as: VENTOLIN  HFA Inhale 2 puffs into the lungs every 6 (six) hours as needed for wheezing or shortness of breath.   bumetanide  1 MG tablet Commonly known as: BUMEX  Take 2 tablets (2 mg total) by mouth daily.   desvenlafaxine  100 MG 24 hr tablet Commonly known as:  PRISTIQ  Take 100 mg by mouth in the morning.   ferrous sulfate  325 (65 FE) MG tablet Take 1 tablet (325 mg total) by mouth daily with breakfast.   fluticasone -salmeterol 100-50 MCG/ACT Aepb Commonly known as: ADVAIR Inhale 1 puff into the lungs 2 (two) times daily. What changed: when to take this   furosemide  40 MG tablet Commonly known as: Lasix  Take 1 tablet (40 mg total) by mouth daily.   guaiFENesin -dextromethorphan  100-10 MG/5ML syrup Commonly known as: ROBITUSSIN DM Take 10 mLs by mouth every 4 (four) hours as needed for cough.   lidocaine -prilocaine  cream Commonly known as: EMLA  Apply 1 Application topically as needed. Apply a small amount to port a cath site and cover with plastic wrap 1 hour prior to infusion appointments   lovastatin  20 MG tablet Commonly known as: MEVACOR  Take 1 tablet (20 mg total) by mouth at bedtime.  methylPREDNISolone  4 MG Tbpk tablet Commonly known as: MEDROL  DOSEPAK Medrol  Dosepak take as instructed   metolazone  2.5 MG tablet Commonly known as: ZAROXOLYN  Take 1 tablet (2.5 mg total) by mouth daily as needed. What changed: reasons to take this   nystatin  powder Commonly known as: MYCOSTATIN /NYSTOP  Apply 1 Application topically 2 (two) times daily.   oxyCODONE -acetaminophen  10-325 MG tablet Commonly known as: PERCOCET Take 1 tablet by mouth 4 (four) times daily as needed for pain.   OXYGEN  Inhale 5 L into the lungs continuous.   pantoprazole  40 MG tablet Commonly known as: Protonix  Take 1 tablet (40 mg total) by mouth 2 (two) times daily.   potassium chloride  SA 20 MEQ tablet Commonly known as: KLOR-CON  M Take 1 tablet (20 mEq total) by mouth 2 (two) times daily.        Discharge Exam: Filed Weights   12/26/23 0449 12/27/23 0429 12/28/23 0320  Weight: 128.4 kg 129.8 kg 130.6 kg    General:  AAO x 3,  cooperative, no distress;   HEENT:  Normocephalic, PERRL, otherwise with in Normal limits   Neuro:  CNII-XII intact. ,  normal motor and sensation, reflexes intact   Lungs:   Clear to auscultation BL, Respirations unlabored,  No wheezes / crackles  Cardio:    S1/S2, RRR, No murmure, No Rubs or Gallops   Abdomen:  Soft, non-tender, bowel sounds active all four quadrants, no guarding or peritoneal signs.  Muscular  skeletal:  Limited exam -global generalized weaknesses - in bed, able to move all 4 extremities,   2+ pulses,  symmetric, No pitting edema  Skin:  Dry, warm to touch, negative for any Rashes,  Wounds: Please see nursing documentation    Condition at discharge: fair  The results of significant diagnostics from this hospitalization (including imaging, microbiology, ancillary and laboratory) are listed below for reference.   Imaging Studies: ECHOCARDIOGRAM COMPLETE Result Date: 12/26/2023    ECHOCARDIOGRAM REPORT   Patient Name:   Daleah  AKAIYA TOUCHETTE Date of Exam: 12/26/2023 Medical Rec #:  [id number]          Height:       62.0 in Accession #:    [id number]         Weight:       283.1 lb Date of Birth:  June 14, 1950          BSA:          2.216 m Patient Age:    73 years           BP:           131/77 mmHg Patient Gender: F                  HR:           75 bpm. Exam Location:  [facility] Procedure: 2D Echo, Cardiac Doppler and Color Doppler (Both Spectral and Color            Flow Doppler were utilized during procedure). Indications:     Aortic stenosis  History:         Patient has prior history of Echocardiogram examinations, most                  recent 09/27/2022. COPD; Risk Factors:Hypertension, Diabetes and                  Dyslipidemia.  Sonographer:     Meagan Baucom RDCS, FE, PE Referring Phys:  [phone] ANNA W  BOONE Diagnosing Phys: Vishnu Priya Mallipeddi IMPRESSIONS  1. Left ventricular ejection fraction, by estimation, is 60 to 65%. The left ventricle has normal function. The left ventricle has no regional wall motion abnormalities. There is mild left ventricular hypertrophy. Left ventricular diastolic  parameters are consistent with Grade I diastolic dysfunction (impaired relaxation). Elevated left ventricular end-diastolic pressure.  2. Right ventricular systolic function is normal. The right ventricular size is normal. There is normal pulmonary artery systolic pressure.  3. Left atrial size was mild to moderately dilated.  4. Right atrial size was moderately dilated.  5. The mitral valve is abnormal. No evidence of mitral valve regurgitation. No evidence of mitral stenosis. Moderate to severe mitral annular calcification.  6. The aortic valve is tricuspid. There is moderate calcification of the aortic valve. There is moderate thickening of the aortic valve. Aortic valve regurgitation is not visualized. Mild to moderate aortic stenosis is present. Aortic valve area, by VTI  measures 2.63 cm likely due to overestimated LVOT diameter but aortic valve area by planimetry was 1.89 cm2 although this image was suboptimal. Based on restricted aortic valve leaflet excursion, aortic valve stenosis is mild to moderate. Aortic valve mean gradient measures 24.7 mmHg. Aortic valve Vmax measures 3.35 m/s.  7. Aortic dilatation noted. There is mild dilatation of the ascending aorta, measuring 41 mm.  8. The inferior vena cava is dilated in size with >50% respiratory variability, suggesting right atrial pressure of 8 mmHg. FINDINGS  Left Ventricle: Left ventricular ejection fraction, by estimation, is 60 to 65%. The left ventricle has normal function. The left ventricle has no regional wall motion abnormalities. Strain was performed and the global longitudinal strain is indeterminate. The left ventricular internal cavity size was normal in size. There is mild left ventricular hypertrophy. Left ventricular diastolic function could not be evaluated due to mitral annular calcification (moderate or greater). Left ventricular diastolic parameters are consistent with Grade I diastolic dysfunction (impaired relaxation). Elevated left  ventricular end-diastolic pressure. Right Ventricle: The right ventricular size is normal. No increase in right ventricular wall thickness. Right ventricular systolic function is normal. There is normal pulmonary artery systolic pressure. The tricuspid regurgitant velocity is 2.41 m/s, and  with an assumed right atrial pressure of 8 mmHg, the estimated right ventricular systolic pressure is 31.2 mmHg. Left Atrium: Left atrial size was mild to moderately dilated. Right Atrium: Right atrial size was moderately dilated. Pericardium: There is no evidence of pericardial effusion. Mitral Valve: The mitral valve is abnormal. Moderate to severe mitral annular calcification. No evidence of mitral valve regurgitation. No evidence of mitral valve stenosis. Tricuspid Valve: The tricuspid valve is normal in structure. Tricuspid valve regurgitation is trivial. No evidence of tricuspid stenosis. Aortic Valve: The aortic valve is tricuspid. There is moderate calcification of the aortic valve. There is moderate thickening of the aortic valve. Aortic valve regurgitation is not visualized. Mild to moderate aortic stenosis is present. Aortic valve mean gradient measures 24.7 mmHg. Aortic valve peak gradient measures 44.9 mmHg. Aortic valve area, by VTI measures 2.63 cm. Pulmonic Valve: The pulmonic valve was not well visualized. Pulmonic valve regurgitation is not visualized. No evidence of pulmonic stenosis. Aorta: The aortic root is normal in size and structure and aortic dilatation noted. There is mild dilatation of the ascending aorta, measuring 41 mm. Venous: The inferior vena cava is dilated in size with greater than 50% respiratory variability, suggesting right atrial pressure of 8 mmHg. IAS/Shunts: No atrial level shunt detected by color flow  Doppler. Additional Comments: 3D was performed not requiring image post processing on an independent workstation and was indeterminate.  LEFT VENTRICLE PLAX 2D LVIDd:         4.80 cm    Diastology LVIDs:         3.00 cm   LV e' medial:    5.44 cm/s LV PW:         1.30 cm   LV E/e' medial:  21.0 LV IVS:        1.20 cm   LV e' lateral:   8.05 cm/s LVOT diam:     2.65 cm   LV E/e' lateral: 14.2 LV SV:         158 LV SV Index:   71 LVOT Area:     5.52 cm  RIGHT VENTRICLE RV S prime:     10.80 cm/s TAPSE (M-mode): 1.9 cm LEFT ATRIUM             Index        RIGHT ATRIUM           Index LA Vol (A2C):   75.1 ml 33.89 ml/m  RA Area:     26.10 cm LA Vol (A4C):   90.2 ml 40.71 ml/m  RA Volume:   80.30 ml  36.24 ml/m LA Biplane Vol: 86.6 ml 39.08 ml/m  AORTIC VALVE AV Area (Vmax):    2.26 cm AV Area (Vmean):   2.29 cm AV Area (VTI):     2.63 cm AV Vmax:           335.00 cm/s AV Vmean:          231.333 cm/s AV VTI:            0.600 m AV Peak Grad:      44.9 mmHg AV Mean Grad:      24.7 mmHg LVOT Vmax:         137.00 cm/s LVOT Vmean:        96.100 cm/s LVOT VTI:          0.286 m LVOT/AV VTI ratio: 0.48  AORTA Ao Root diam: 3.30 cm Ao Asc diam:  4.10 cm MITRAL VALVE                TRICUSPID VALVE MV Area (PHT): 3.74 cm     TR Peak grad:   23.2 mmHg MV Decel Time: 203 msec     TR Vmax:        241.00 cm/s MV E velocity: 114.00 cm/s MV A velocity: 131.00 cm/s  SHUNTS MV E/A ratio:  0.87         Systemic VTI:  0.29 m                             Systemic Diam: 2.65 cm Vishnu Priya Mallipeddi Electronically signed by Diannah Late Mallipeddi Signature Date/Time: 12/26/2023/12:04:37 PM    Final (Updated)    DG Chest Portable 1 View Result Date: 12/24/2023 CLINICAL DATA:  Cough and fever. EXAM: PORTABLE CHEST 1 VIEW COMPARISON:  November 13, 2023 FINDINGS: There is stable right-sided venous Port-A-Cath positioning. The cardiac silhouette is enlarged and unchanged in size. There is marked severity calcification of the aortic arch. Prominence of the central pulmonary vasculature is seen. Mild, diffusely increased interstitial lung markings are noted. No pleural effusion or pneumothorax is identified. Multilevel  degenerative changes are seen throughout the thoracic spine. IMPRESSION: Cardiomegaly with mild pulmonary  vascular congestion. Electronically Signed   By: Suzen Dials M.D.   On: 12/24/2023 17:14    Microbiology: Results for orders placed or performed during the hospital encounter of 12/24/23  Resp panel by RT-PCR (RSV, Flu A&B, Covid) Anterior Nasal Swab     Status: Abnormal   Collection Time: 12/24/23  4:48 PM   Specimen: Anterior Nasal Swab  Result Value Ref Range Status   SARS Coronavirus 2 by RT PCR POSITIVE (A) NEGATIVE Final    Comment: (NOTE) SARS-CoV-2 target nucleic acids are DETECTED.  The SARS-CoV-2 RNA is generally detectable in upper respiratory specimens during the acute phase of infection. Positive results are indicative of the presence of the identified virus, but do not rule out bacterial infection or co-infection with other pathogens not detected by the test. Clinical correlation with patient history and other diagnostic information is necessary to determine patient infection status. The expected result is Negative.  Fact Sheet for Patients: BloggerCourse.com  Fact Sheet for Healthcare Providers: SeriousBroker.it  This test is not yet approved or cleared by the [place]  FDA and  has been authorized for detection and/or diagnosis of SARS-CoV-2 by FDA under an Emergency Use Authorization (EUA).  This EUA will remain in effect (meaning this test can be used) for the duration of  the COVID-19 declaration under Section 564(b)(1) of the A ct, 21 U.S.C. section 360bbb-3(b)(1), unless the authorization is terminated or revoked sooner.     Influenza A by PCR NEGATIVE NEGATIVE Final   Influenza B by PCR NEGATIVE NEGATIVE Final    Comment: (NOTE) The Xpert Xpress SARS-CoV-2/FLU/RSV plus assay is intended as an aid in the diagnosis of influenza from Nasopharyngeal swab specimens and should not be used as a sole  basis for treatment. Nasal washings and aspirates are unacceptable for Xpert Xpress SARS-CoV-2/FLU/RSV testing.  Fact Sheet for Patients: BloggerCourse.com  Fact Sheet for Healthcare Providers: SeriousBroker.it  This test is not yet approved or cleared by the [place]  FDA and has been authorized for detection and/or diagnosis of SARS-CoV-2 by FDA under an Emergency Use Authorization (EUA). This EUA will remain in effect (meaning this test can be used) for the duration of the COVID-19 declaration under Section 564(b)(1) of the Act, 21 U.S.C. section 360bbb-3(b)(1), unless the authorization is terminated or revoked.     Resp Syncytial Virus by PCR NEGATIVE NEGATIVE Final    Comment: (NOTE) Fact Sheet for Patients: BloggerCourse.com  Fact Sheet for Healthcare Providers: SeriousBroker.it  This test is not yet approved or cleared by the [place]  FDA and has been authorized for detection and/or diagnosis of SARS-CoV-2 by FDA under an Emergency Use Authorization (EUA). This EUA will remain in effect (meaning this test can be used) for the duration of the COVID-19 declaration under Section 564(b)(1) of the Act, 21 U.S.C. section 360bbb-3(b)(1), unless the authorization is terminated or revoked.  Performed at [facility], [address], [place], [place] [address]   Culture, blood (Routine x 2)     Status: None (Preliminary result)   Collection Time: 12/24/23  5:13 PM   Specimen: Porta Cath; Blood  Result Value Ref Range Status   Specimen Description PORTA CATH BOTTLES DRAWN AEROBIC AND ANAEROBIC  Final   Special Requests Blood Culture adequate volume  Final   Culture   Final    NO GROWTH 4 DAYS Performed at [facility], [address] [place], [place] [address]    Report Status PENDING  Incomplete  Culture, blood (Routine x 2)  Status: None (Preliminary result)    Collection Time: 12/24/23  5:13 PM   Specimen: BLOOD  Result Value Ref Range Status   Specimen Description BLOOD BLOOD RIGHT ARM  Final   Special Requests NONE  Final   Culture   Final    NO GROWTH 4 DAYS Performed at [facility], [address], [place], [place] [address]    Report Status PENDING  Incomplete   *Note: Due to a large number of results and/or encounters for the requested time period, some results have not been displayed. A complete set of results can be found in Results Review.    Labs: CBC: Recent Labs  Lab 12/24/23 1713 12/25/23 0515 12/26/23 0437 12/27/23 0513 12/28/23 0430  WBC 3.9* 3.1* 5.2 4.9 5.7  NEUTROABS 2.6  --   --   --   --   HGB 6.0* 6.0* 8.0* 7.9* 8.8*  HCT 21.2* 21.4* 26.5* 26.8* 30.4*  MCV 103.4* 102.9* 98.9 98.9 99.7  PLT 92* 89* 104* 103* 108*   Basic Metabolic Panel: Recent Labs  Lab 12/24/23 1713 12/25/23 0504 12/26/23 0437 12/27/23 0513 12/28/23 0430  NA 141 142 140 144 144  K 3.6 3.8 3.5 3.7 3.4*  CL 102 101 100 105 103  CO2 31 31 31 29  32  GLUCOSE 149* 181* 216* 197* 158*  BUN 15 13 16 18 21   CREATININE 0.56 0.55 0.64 0.54 0.55  CALCIUM  8.1* 7.8* 7.2* 8.0* 8.4*   Liver Function Tests: Recent Labs  Lab 12/24/23 1713 12/26/23 0437  AST 16 14*  ALT 11 10  ALKPHOS 76 69  BILITOT 0.6 0.7  PROT 5.0* 5.0*  ALBUMIN 2.8* 2.8*   CBG: Recent Labs  Lab 12/27/23 0727 12/27/23 1112 12/27/23 1610 12/27/23 2137 12/28/23 0733  GLUCAP 203* 204* 169* 231* 179*    Discharge time spent: greater than 45 minutes.  Signed: Adriana DELENA Grams, MD [facility] 12/28/2023

## 2023-12-28 NOTE — TOC Progression Note (Signed)
 Transition of Care [facility]) - Progression Note    Patient Details  Name: Savannah Burns MRN: [id number] Date of Birth: 04-May-1950  Transition of Care [facility]) CM/SW Contact  [place] Saddie [place], [place] Phone Number: 12/28/2023, 10:23 AM  Clinical Narrative: Pt d/c today. MD ordered HHPT/OT. LCSW discussed with pt who declines. She states her granddaughter lives with her and helps her and she does not feel this is needed. Pt is on 3L home O2. No other needs reported.       Expected Discharge Plan: Home/Self Care Barriers to Discharge: Barriers Resolved               Expected Discharge Plan and Services In-house Referral: Clinical Social Work   Post Acute Care Choice: Durable Medical Equipment Living arrangements for the past 2 months: Single Family Home Expected Discharge Date: 12/28/23                                     Social Drivers of Health (SDOH) Interventions SDOH Screenings   Food Insecurity: No Food Insecurity (12/24/2023)  Housing: Low Risk  (12/24/2023)  Transportation Needs: No Transportation Needs (12/24/2023)  Utilities: Not At Risk (12/24/2023)  Alcohol  Screen: Low Risk  (01/27/2023)  Depression (PHQ2-9): Low Risk  (11/21/2023)  Financial Resource Strain: Low Risk  (06/12/2023)  Physical Activity: Inactive (01/27/2023)  Social Connections: Socially Isolated (12/24/2023)  Stress: No Stress Concern Present (01/27/2023)  Tobacco Use: Medium Risk (12/24/2023)  Health Literacy: Adequate Health Literacy (01/27/2023)    Readmission Risk Interventions    12/25/2023   10:00 AM  Readmission Risk Prevention Plan  Transportation Screening Complete  HRI or Home Care Consult Complete  Social Work Consult for Recovery Care Planning/Counseling Complete  Palliative Care Screening Not Applicable  Medication Review Oceanographer) Complete

## 2023-12-28 NOTE — Plan of Care (Signed)
  Problem: Education: Goal: Knowledge of General Education information will improve Description: Including pain rating scale, medication(s)/side effects and non-pharmacologic comfort measures Outcome: Progressing   Problem: Clinical Measurements: Goal: Ability to maintain clinical measurements within normal limits will improve Outcome: Progressing Goal: Respiratory complications will improve Outcome: Progressing   Problem: Safety: Goal: Ability to remain free from injury will improve Outcome: Progressing   Problem: Skin Integrity: Goal: Risk for impaired skin integrity will decrease Outcome: Progressing   Problem: Metabolic: Goal: Ability to maintain appropriate glucose levels will improve Outcome: Progressing   Problem: Education: Goal: Ability to demonstrate management of disease process will improve Outcome: Progressing   Problem: Activity: Goal: Capacity to carry out activities will improve Outcome: Progressing

## 2023-12-29 ENCOUNTER — Telehealth: Payer: Self-pay

## 2023-12-29 LAB — CULTURE, BLOOD (ROUTINE X 2)
Culture: NO GROWTH
Culture: NO GROWTH
Special Requests: ADEQUATE

## 2023-12-29 NOTE — Transitions of Care (Post Inpatient/ED Visit) (Signed)
   12/29/2023  Name: Savannah  ANARIA Burns MRN: [id number] DOB: May 01, 1950  Today's TOC FU Call Status: Today's TOC FU Call Status:: Unsuccessful Call (1st Attempt) Unsuccessful Call (1st Attempt) Date: 12/29/23  Attempted to reach the patient regarding the most recent Inpatient/ED visit.  Follow Up Plan: Additional outreach attempts will be made to reach the patient to complete the Transitions of Care (Post Inpatient/ED visit) call.   Alan Ee, RN, BSN, CEN [employer]- Transition of Care Team.  [facility] [phone]

## 2023-12-29 NOTE — Telephone Encounter (Signed)
   Name:  Savannah Burns  SHINITA MAC  DOB:  [birth year]  MRN:  [id number]   Primary Cardiologist: Alvan Carrier, MD  Chart reviewed as part of pre-operative protocol coverage. Patient was contacted 12/29/2023 in reference to pre-operative risk assessment for pending surgery as outlined below.  Tiea  G Grimes was was previously cleared for this procedure during the last office visit, however he is currently in the hospital with acute on chronic respiratory distress.  Per internal medicine note from yesterday  exacerbation believed to be associated with decompensated CHF, mild COPD exacerbation, worsening anemia and also COVID infection.  He just received 2 units of packed red blood cell blood transfusion yesterday.  At this time, he is still requiring 4 L oxygen .  Although GI service was consulted during the hospitalization given anemia, however given the patient's overall respiratory status, it was recommended to delay his GI procedure as outpatient.  His overall condition needs to be reevaluated before clearance can be finalized.  Due to new or worsening symptoms, Abria  G Rawl will require a follow-up visit for further pre-operative risk assessment.  Pre-op covering staff: - Please schedule appointment and call patient to inform them. If patient already had an upcoming appointment within acceptable timeframe, please add pre-op clearance to the appointment notes so provider is aware. - Please contact requesting surgeon's office via preferred method (i.e, phone, fax) to inform them of need for appointment prior to surgery.  Lamarr Satterfield, NP 12/29/2023, 10:00 AM

## 2024-01-01 ENCOUNTER — Telehealth: Payer: Self-pay

## 2024-01-01 NOTE — Transitions of Care (Post Inpatient/ED Visit) (Signed)
   01/01/2024  Name: Savannah  CYTHNIA Burns MRN: [id number] DOB: 1951-03-24  Today's TOC FU Call Status: Today's TOC FU Call Status:: Unsuccessful Call (2nd Attempt) Unsuccessful Call (2nd Attempt) Date: 01/02/24  Attempted to reach the patient regarding the most recent Inpatient/ED visit.  Follow Up Plan: Additional outreach attempts will be made to reach the patient to complete the Transitions of Care (Post Inpatient/ED visit) call.   Alan Ee, RN, BSN, CEN [employer]- Transition of Care Team.  [facility] [phone]

## 2024-01-02 ENCOUNTER — Inpatient Hospital Stay: Attending: Hematology

## 2024-01-02 ENCOUNTER — Telehealth: Payer: Self-pay | Admitting: *Deleted

## 2024-01-02 ENCOUNTER — Inpatient Hospital Stay (HOSPITAL_BASED_OUTPATIENT_CLINIC_OR_DEPARTMENT_OTHER): Admitting: Oncology

## 2024-01-02 ENCOUNTER — Inpatient Hospital Stay

## 2024-01-02 ENCOUNTER — Encounter: Payer: Self-pay | Admitting: Oncology

## 2024-01-02 VITALS — BP 141/65 | HR 76 | Temp 97.8°F | Resp 18

## 2024-01-02 DIAGNOSIS — I110 Hypertensive heart disease with heart failure: Secondary | ICD-10-CM | POA: Diagnosis not present

## 2024-01-02 DIAGNOSIS — D500 Iron deficiency anemia secondary to blood loss (chronic): Secondary | ICD-10-CM | POA: Diagnosis not present

## 2024-01-02 DIAGNOSIS — Z87891 Personal history of nicotine dependence: Secondary | ICD-10-CM | POA: Insufficient documentation

## 2024-01-02 DIAGNOSIS — R739 Hyperglycemia, unspecified: Secondary | ICD-10-CM | POA: Diagnosis not present

## 2024-01-02 DIAGNOSIS — K7460 Unspecified cirrhosis of liver: Secondary | ICD-10-CM | POA: Insufficient documentation

## 2024-01-02 DIAGNOSIS — E538 Deficiency of other specified B group vitamins: Secondary | ICD-10-CM | POA: Insufficient documentation

## 2024-01-02 DIAGNOSIS — Z8616 Personal history of COVID-19: Secondary | ICD-10-CM | POA: Insufficient documentation

## 2024-01-02 DIAGNOSIS — R0602 Shortness of breath: Secondary | ICD-10-CM | POA: Insufficient documentation

## 2024-01-02 DIAGNOSIS — Z86018 Personal history of other benign neoplasm: Secondary | ICD-10-CM | POA: Insufficient documentation

## 2024-01-02 DIAGNOSIS — Z79624 Long term (current) use of inhibitors of nucleotide synthesis: Secondary | ICD-10-CM | POA: Insufficient documentation

## 2024-01-02 DIAGNOSIS — I509 Heart failure, unspecified: Secondary | ICD-10-CM | POA: Diagnosis not present

## 2024-01-02 DIAGNOSIS — I700 Atherosclerosis of aorta: Secondary | ICD-10-CM | POA: Insufficient documentation

## 2024-01-02 DIAGNOSIS — K922 Gastrointestinal hemorrhage, unspecified: Secondary | ICD-10-CM | POA: Diagnosis not present

## 2024-01-02 DIAGNOSIS — E785 Hyperlipidemia, unspecified: Secondary | ICD-10-CM | POA: Insufficient documentation

## 2024-01-02 DIAGNOSIS — J440 Chronic obstructive pulmonary disease with acute lower respiratory infection: Secondary | ICD-10-CM | POA: Insufficient documentation

## 2024-01-02 DIAGNOSIS — Z85828 Personal history of other malignant neoplasm of skin: Secondary | ICD-10-CM | POA: Insufficient documentation

## 2024-01-02 DIAGNOSIS — Z7985 Long-term (current) use of injectable non-insulin antidiabetic drugs: Secondary | ICD-10-CM | POA: Diagnosis not present

## 2024-01-02 DIAGNOSIS — Z9221 Personal history of antineoplastic chemotherapy: Secondary | ICD-10-CM | POA: Insufficient documentation

## 2024-01-02 DIAGNOSIS — Z8701 Personal history of pneumonia (recurrent): Secondary | ICD-10-CM | POA: Diagnosis not present

## 2024-01-02 DIAGNOSIS — R059 Cough, unspecified: Secondary | ICD-10-CM | POA: Insufficient documentation

## 2024-01-02 DIAGNOSIS — R5383 Other fatigue: Secondary | ICD-10-CM | POA: Insufficient documentation

## 2024-01-02 DIAGNOSIS — Z7952 Long term (current) use of systemic steroids: Secondary | ICD-10-CM | POA: Diagnosis not present

## 2024-01-02 DIAGNOSIS — D72819 Decreased white blood cell count, unspecified: Secondary | ICD-10-CM | POA: Insufficient documentation

## 2024-01-02 DIAGNOSIS — M47814 Spondylosis without myelopathy or radiculopathy, thoracic region: Secondary | ICD-10-CM | POA: Insufficient documentation

## 2024-01-02 DIAGNOSIS — C9000 Multiple myeloma not having achieved remission: Secondary | ICD-10-CM

## 2024-01-02 DIAGNOSIS — Z794 Long term (current) use of insulin: Secondary | ICD-10-CM | POA: Insufficient documentation

## 2024-01-02 DIAGNOSIS — I8500 Esophageal varices without bleeding: Secondary | ICD-10-CM | POA: Insufficient documentation

## 2024-01-02 DIAGNOSIS — Z79899 Other long term (current) drug therapy: Secondary | ICD-10-CM | POA: Insufficient documentation

## 2024-01-02 DIAGNOSIS — E1165 Type 2 diabetes mellitus with hyperglycemia: Secondary | ICD-10-CM | POA: Diagnosis not present

## 2024-01-02 DIAGNOSIS — Z5112 Encounter for antineoplastic immunotherapy: Secondary | ICD-10-CM | POA: Diagnosis present

## 2024-01-02 DIAGNOSIS — Z87442 Personal history of urinary calculi: Secondary | ICD-10-CM | POA: Insufficient documentation

## 2024-01-02 DIAGNOSIS — Z7961 Long term (current) use of immunomodulator: Secondary | ICD-10-CM | POA: Insufficient documentation

## 2024-01-02 LAB — COMPREHENSIVE METABOLIC PANEL WITH GFR
ALT: 31 U/L (ref 0–44)
AST: 17 U/L (ref 15–41)
Albumin: 3.6 g/dL (ref 3.5–5.0)
Alkaline Phosphatase: 112 U/L (ref 38–126)
Anion gap: 9 (ref 5–15)
BUN: 13 mg/dL (ref 8–23)
CO2: 30 mmol/L (ref 22–32)
Calcium: 8.5 mg/dL — ABNORMAL LOW (ref 8.9–10.3)
Chloride: 101 mmol/L (ref 98–111)
Creatinine, Ser: 0.62 mg/dL (ref 0.44–1.00)
GFR, Estimated: >60 mL/min (ref >60)
Glucose, Bld: 240 mg/dL — ABNORMAL HIGH (ref 70–99)
Potassium: 3.6 mmol/L (ref 3.5–5.1)
Sodium: 140 mmol/L (ref 135–145)
Total Bilirubin: 0.5 mg/dL (ref 0.0–1.2)
Total Protein: 5.2 g/dL — ABNORMAL LOW (ref 6.5–8.1)

## 2024-01-02 LAB — CBC WITH DIFFERENTIAL/PLATELET
Abs Immature Granulocytes: 0.01 K/uL (ref 0.00–0.07)
Basophils Absolute: 0 K/uL (ref 0.0–0.1)
Basophils Relative: 0 %
Eosinophils Absolute: 0 K/uL (ref 0.0–0.5)
Eosinophils Relative: 1 %
HCT: 32 % — ABNORMAL LOW (ref 36.0–46.0)
Hemoglobin: 9.3 g/dL — ABNORMAL LOW (ref 12.0–15.0)
Immature Granulocytes: 0 %
Lymphocytes Relative: 28 %
Lymphs Abs: 1.2 K/uL (ref 0.7–4.0)
MCH: 28.4 pg (ref 26.0–34.0)
MCHC: 29.1 g/dL — ABNORMAL LOW (ref 30.0–36.0)
MCV: 97.6 fL (ref 80.0–100.0)
Monocytes Absolute: 0.4 K/uL (ref 0.1–1.0)
Monocytes Relative: 10 %
Neutro Abs: 2.6 K/uL (ref 1.7–7.7)
Neutrophils Relative %: 61 %
Platelets: 99 K/uL — ABNORMAL LOW (ref 150–400)
RBC: 3.28 MIL/uL — ABNORMAL LOW (ref 3.87–5.11)
RDW: 15.9 % — ABNORMAL HIGH (ref 11.5–15.5)
WBC: 4.3 K/uL (ref 4.0–10.5)
nRBC: 0 % (ref 0.0–0.2)

## 2024-01-02 LAB — SAMPLE TO BLOOD BANK

## 2024-01-02 LAB — MAGNESIUM: Magnesium: 2.2 mg/dL (ref 1.7–2.4)

## 2024-01-02 MED ORDER — SODIUM CHLORIDE 0.9 % IV SOLN: Freq: Once | INTRAVENOUS | Status: DC | PRN

## 2024-01-02 MED ORDER — HEPARIN SOD (PORK) LOCK FLUSH 100 UNIT/ML IV SOLN: 500.0000 [IU] | Freq: Once | INTRAVENOUS | Status: DC | PRN

## 2024-01-02 MED ORDER — ALTEPLASE 2 MG IJ SOLR: 2.0000 mg | Freq: Once | INTRAMUSCULAR | Status: DC | PRN

## 2024-01-02 MED ORDER — DARATUMUMAB-HYALURONIDASE-FIHJ 1800-30000 MG-UT/15ML ~~LOC~~ SOLN
1800.0000 mg | Freq: Once | SUBCUTANEOUS | Status: AC
  Administered 2024-01-02: 1800 mg via SUBCUTANEOUS
  Filled 2024-01-02: qty 15

## 2024-01-02 MED ORDER — SODIUM CHLORIDE 0.9 % IV SOLN
500.0000 mg | Freq: Once | INTRAVENOUS | Status: AC
  Administered 2024-01-02: 500 mg via INTRAVENOUS
  Filled 2024-01-02: qty 25

## 2024-01-02 MED ORDER — ACETAMINOPHEN 325 MG PO TABS
650.0000 mg | ORAL_TABLET | Freq: Once | ORAL | Status: AC
  Administered 2024-01-02: 650 mg via ORAL
  Filled 2024-01-02: qty 2

## 2024-01-02 MED ORDER — SODIUM CHLORIDE 0.9% FLUSH: 10.0000 mL | Freq: Once | INTRAVENOUS | Status: DC | PRN

## 2024-01-02 MED ORDER — DEXAMETHASONE 4 MG PO TABS: 20.0000 mg | ORAL_TABLET | Freq: Once | ORAL | Status: DC

## 2024-01-02 MED ORDER — FAMOTIDINE IN NACL 20-0.9 MG/50ML-% IV SOLN
20.0000 mg | Freq: Once | INTRAVENOUS | Status: DC | PRN
  Filled 2024-01-02: qty 50

## 2024-01-02 MED ORDER — CYANOCOBALAMIN 1000 MCG/ML IJ SOLN
1000.0000 ug | Freq: Once | INTRAMUSCULAR | Status: AC
  Administered 2024-01-02: 1000 ug via INTRAMUSCULAR
  Filled 2024-01-02: qty 1

## 2024-01-02 MED ORDER — DIPHENHYDRAMINE HCL 25 MG PO CAPS: 50.0000 mg | ORAL_CAPSULE | Freq: Once | ORAL | Status: DC

## 2024-01-02 MED ORDER — EPINEPHRINE 0.3 MG/0.3ML IJ SOAJ: 0.3000 mg | Freq: Once | INTRAMUSCULAR | Status: DC | PRN

## 2024-01-02 MED ORDER — ALBUTEROL SULFATE (2.5 MG/3ML) 0.083% IN NEBU: 2.5000 mg | INHALATION_SOLUTION | Freq: Once | RESPIRATORY_TRACT | Status: DC | PRN

## 2024-01-02 MED ORDER — METHYLPREDNISOLONE SODIUM SUCC 125 MG IJ SOLR
125.0000 mg | Freq: Once | INTRAMUSCULAR | Status: DC | PRN
  Filled 2024-01-02: qty 2

## 2024-01-02 MED ORDER — DIPHENHYDRAMINE HCL 50 MG/ML IJ SOLN
50.0000 mg | Freq: Once | INTRAMUSCULAR | Status: DC | PRN
  Filled 2024-01-02: qty 1

## 2024-01-02 MED ORDER — SODIUM CHLORIDE 0.9 % IV SOLN: Freq: Once | INTRAVENOUS | Status: AC

## 2024-01-02 MED ORDER — HEPARIN SOD (PORK) LOCK FLUSH 100 UNIT/ML IV SOLN: 250.0000 [IU] | Freq: Once | INTRAVENOUS | Status: DC | PRN

## 2024-01-02 MED ORDER — DEXAMETHASONE 4 MG PO TABS
20.0000 mg | ORAL_TABLET | Freq: Once | ORAL | Status: AC
  Administered 2024-01-02: 20 mg via ORAL
  Filled 2024-01-02: qty 5

## 2024-01-02 MED ORDER — CETIRIZINE HCL 10 MG PO TABS
10.0000 mg | ORAL_TABLET | Freq: Once | ORAL | Status: AC
  Administered 2024-01-02: 10 mg via ORAL
  Filled 2024-01-02: qty 1

## 2024-01-02 MED ORDER — SODIUM CHLORIDE 0.9% FLUSH: 3.0000 mL | Freq: Once | INTRAVENOUS | Status: DC | PRN

## 2024-01-02 NOTE — Transitions of Care (Post Inpatient/ED Visit) (Signed)
 01/02/2024  Name: Savannah Burns MRN: [id number] DOB: 10-30-1950  Today's TOC FU Call Status: Today's TOC FU Call Status:: Successful TOC FU Call Completed TOC FU Call Complete Date: 01/02/24 Patient's Name and Date of Birth confirmed.  Transition Care Management Follow-up Telephone Call Date of Discharge: 12/28/23 [facility] Type of Discharge: Inpatient Admission Primary Inpatient Discharge Diagnosis:: Acute on chronic respiratory failure with hypoxia How have you been since you were released from the hospital?:  (eating, drinking well, ambulating with walker) Any questions or concerns?: No  Items Reviewed: Did you receive and understand the discharge instructions provided?: Yes Medications obtained,verified, and reconciled?: Yes (Medications Reviewed) Any new allergies since your discharge?: No Dietary orders reviewed?: Yes Type of Diet Ordered:: heart healthy, carbohydrate modified Do you have support at home?: Yes People in Home [RPT]: grandchild(ren) Name of Support/Comfort Primary Source: Savannah Burns  Medications Reviewed Today: Medications Reviewed Today     Reviewed by Aura Mliss LABOR, RN (Registered Nurse) on 01/02/24 at 1541  Med List Status: <None>   Medication Order Taking? Sig Documenting Provider Last Dose Status Informant  0.9 %  sodium chloride  infusion [id number]   Kandala, Hyndavi, MD  Active   acyclovir  (ZOVIRAX ) 400 MG tablet [id number] Yes Take 1 tablet (400 mg total) by mouth 2 (two) times daily. Davonna Siad, MD  Active Family Member, Pharmacy Records, Child  albuterol  (PROVENTIL ) (2.5 MG/3ML) 0.083% nebulizer solution [id number] Yes Take 3 mLs (2.5 mg total) by nebulization every 4 (four) hours as needed for wheezing or shortness of breath. Pearlean Manus, MD  Active Family Member, Pharmacy Records, Child  albuterol  (PROVENTIL ) (2.5 MG/3ML) 0.083% nebulizer solution 2.5 mg [id number]   Davonna Siad, MD  Active    albuterol  (VENTOLIN  HFA) 108 (90 Base) MCG/ACT inhaler [id number] Yes Inhale 2 puffs into the lungs every 6 (six) hours as needed for wheezing or shortness of breath. Pearlean Manus, MD  Active Family Member, Pharmacy Records, Child  alteplase  (CATHFLO ACTIVASE ) injection 2 mg [id number]   Kandala, Hyndavi, MD  Active   bumetanide  (BUMEX ) 1 MG tablet [id number] Yes Take 2 tablets (2 mg total) by mouth daily. Rogers Hai, MD  Active Family Member, Pharmacy Records, Child           Med Note (WARD, CHUCK KANDICE Kitchens Dec 25, 2023  5:01 PM) Pt has not taken in over 30 days, pt's granddaughter and daughter state that is d/t noncompliance from the pt   desvenlafaxine  (PRISTIQ ) 100 MG 24 hr tablet [id number] Yes Take 100 mg by mouth in the morning. [provider]  Active Family Member, Pharmacy Records, Child           Med Note (WARD, CHUCK KANDICE Kitchens Dec 25, 2023  5:01 PM) Pt has not taken in over 30 days, pt's granddaughter and daughter state that is d/t noncompliance from the pt   diphenhydrAMINE  (BENADRYL ) injection 50 mg [id number]   Davonna Siad, MD  Active   EPINEPHrine  (EPI-PEN) injection 0.3 mg [id number]   Davonna Siad, MD  Active   famotidine  (PEPCID ) IVPB 20 mg premix [id number]   Kandala, Hyndavi, MD  Active   ferrous sulfate  325 (65 FE) MG tablet [id number] Yes Take 1 tablet (325 mg total) by mouth daily with breakfast. Willette Adriana LABOR, MD  Active   fluticasone -salmeterol (ADVAIR) 100-50 MCG/ACT AEPB [id number] Yes Inhale 1 puff into the lungs 2 (two) times daily. Pearlean Manus, MD  Active Family Member, Pharmacy Records,  Child           Med Note (WARD, CHUCK MATSU   Mon Dec 25, 2023  4:52 PM) 2 weeks ago  furosemide  (LASIX ) 40 MG tablet [id number] Yes Take 1 tablet (40 mg total) by mouth daily. Pearlean Manus, MD  Active Family Member, Pharmacy Records, Child           Med Note (WARD, CHUCK MATSU Kitchens Dec 25, 2023  5:00 PM) Pt has not taken in over 30 days, pt's  granddaughter and daughter state that is d/t noncompliance from the pt   guaiFENesin -dextromethorphan  (ROBITUSSIN DM) 100-10 MG/5ML syrup [id number] Yes Take 10 mLs by mouth every 4 (four) hours as needed for cough. Willette Adriana LABOR, MD  Active   heparin  lock flush 100 unit/mL [id number]   Davonna Siad, MD  Active   heparin  lock flush 100 unit/mL [id number]   Kandala, Hyndavi, MD  Active   lidocaine -prilocaine  (EMLA ) cream [id number] Yes Apply 1 Application topically as needed. Apply a small amount to port a cath site and cover with plastic wrap 1 hour prior to infusion appointments Pappayliou, Dorothyann LABOR, DO  Active Family Member, Pharmacy Records, Child  lovastatin  (MEVACOR ) 20 MG tablet [id number] Yes Take 1 tablet (20 mg total) by mouth at bedtime. Willette Adriana LABOR, MD  Active Family Member, Pharmacy Records, Child           Med Note (WARD, CHUCK MATSU Kitchens Dec 25, 2023  5:01 PM) Pt has not taken in over 30 days, pt's granddaughter and daughter state that is d/t noncompliance from the pt   methylPREDNISolone  (MEDROL  DOSEPAK) 4 MG TBPK tablet [id number] Yes Medrol  Dosepak take as instructed Shahmehdi, Adriana A, MD  Active   methylPREDNISolone  sodium succinate (SOLU-MEDROL ) 125 mg/2 mL injection 125 mg [id number]   Davonna Siad, MD  Active   metolazone  (ZAROXOLYN ) 2.5 MG tablet [id number] Yes Take 1 tablet (2.5 mg total) by mouth daily as needed. Rogers Hai, MD  Active Family Member, Pharmacy Records, Child  nystatin  (MYCOSTATIN /NYSTOP ) powder [id number] Yes Apply 1 Application topically 2 (two) times daily. Willette Adriana LABOR, MD  Active   oxyCODONE -acetaminophen  (PERCOCET) 10-325 MG tablet [id number] Yes Take 1 tablet by mouth 4 (four) times daily as needed for pain. [provider]  Active Family Member, Pharmacy Records, Child  OXYGEN  [id number] Yes Inhale 5 L into the lungs continuous.  Patient taking differently: Inhale 3 L into the lungs continuous.   [provider]  Active Family Member, Pharmacy Records, Child           Med Note (WARD, ANGELICA G   Thu Jun 15, 2023  2:42 PM)    pantoprazole  (PROTONIX ) 40 MG tablet [id number] Yes Take 1 tablet (40 mg total) by mouth 2 (two) times daily. Willette Adriana LABOR, MD  Active   potassium chloride  SA (KLOR-CON  M) 20 MEQ tablet [id number] Yes Take 1 tablet (20 mEq total) by mouth 2 (two) times daily. Rogers Hai, MD  Active Child           Med Note (WARD, CHUCK MATSU Kitchens Dec 25, 2023  5:00 PM) Pt has not taken in over 30 days, pt's granddaughter and daughter state that is d/t noncompliance from the pt   sodium chloride  flush (NS) 0.9 % injection 10 mL [id number]   Davonna Siad, MD  Active   sodium chloride  flush (NS) 0.9 % injection 3 mL [id number]   Davonna Siad, MD  Active  Med List Note (Ward, Angelica, CPhT 12/25/23 1648): Revlimid  filled at [facility], pt's granddaughter Savannah handles pt's meds            Home Care and Equipment/Supplies: Were Home Health Services Ordered?:  (pt declined home health at hospital) Any new equipment or medical supplies ordered?: No  Functional Questionnaire: Do you need assistance with bathing/showering or dressing?: Yes (shower seat) Do you need assistance with meal preparation?: No Do you need assistance with eating?: No Do you have difficulty maintaining continence: No Do you need assistance with getting out of bed/getting out of a chair/moving?: Yes (walker) Do you have difficulty managing or taking your medications?: No  Follow up appointments reviewed: PCP Follow-up appointment confirmed?: Yes (RN CM scheduled appointment with care guide, pt declined sooner appointment with another provider, pt had labwork done today with oncology, states she is not going to have any other labwork done at present) Date of PCP follow-up appointment?: 02/06/24 Follow-up Provider: Norene Fielding Specialist [facility] Follow-up  appointment confirmed?: Yes Date of Specialist follow-up appointment?: 01/02/24 Follow-Up Specialty Provider:: oncology Do you need transportation to your follow-up appointment?: No Do you understand care options if your condition(s) worsen?: Yes-patient verbalized understanding  SDOH Interventions Today    Flowsheet Row Most Recent Value  SDOH Interventions   Food Insecurity Interventions Intervention Not Indicated  Housing Interventions Intervention Not Indicated  Transportation Interventions Intervention Not Indicated  Utilities Interventions Intervention Not Indicated    Goals Addressed             This Visit's Progress    [facility] Transitions of Care (TOC) Care Plan       Problems:  Recent Hospitalization for treatment of acute hypoxic respiratory failure Equipment/DME barrier pt would like resources for assistance with walk in shower  Goal:  Over the next 30 days, the patient will not experience hospital readmission  Interventions:   Heart Failure Interventions: Provided education on low sodium diet Assessed need for readable accurate scales in home Provided education about placing scale on hard, flat surface Advised patient to weigh each morning after emptying bladder Discussed importance of daily weight and advised patient to weigh and record daily Reviewed role of diuretics in prevention of fluid overload and management of heart failure; Discussed the importance of keeping all appointments with provider Screening for signs and symptoms of depression related to chronic disease state  Assessed social determinant of health barriers  Care guide referral placed for resources- walk in shower  Patient Self Care Activities:  Attend all scheduled provider appointments Call pharmacy for medication refills 3-7 days in advance of running out of medications Call provider office for new concerns or questions  Notify RN Care Manager of TOC call rescheduling needs Participate in  Transition of Care Program/Attend TOC scheduled calls Take medications as prescribed   call office if I gain more than 2 pounds in one day or 5 pounds in one week keep legs up while sitting watch for swelling in feet, ankles and legs every day weigh myself daily develop a rescue plan follow rescue plan if symptoms flare-up eat more whole grains, fruits and vegetables, lean meats and healthy fats dress right for the weather, hot or cold  Plan:  Telephone follow up appointment with care management team member scheduled for:  01/09/24 @ 315 pm The patient has been provided with contact information for the care management team and has been advised to call with any health related questions or concerns.  Mliss Creed [facility], BSN RN Care Manager/ Transition of Care [place]/ [facility] [phone]

## 2024-01-02 NOTE — Progress Notes (Unsigned)
 Patient was recently hospitalized for pneumonia.  Reviewed medication list for update.  Patient forgot to take her steroids for Darazalex.  Patient has completed her antibiotics. Dr. Davonna notified for orders per pharmacy and notified of recent hospitalization.    Patient to be seen by Delon Hope, NP.   Ok to treat verbal order Delon Hope, NP.    Patient tolerated Daratumumab  injection with no complaints voiced.  See MAR for details.  Labs reviewed. Injection site clean and dry with no bruising or swelling noted at site.  Band aid applied.  Vss with discharge and left in satisfactory condition with no s/s of distress noted.   Patient tolerated therapy with no complaints voiced.  Side effects with management reviewed with understanding verbalized.  Port site clean and dry with no bruising or swelling noted at site.  Good blood return noted before and after administration of therapy.  Band aid applied.  Patient left in satisfactory condition with VSS and no s/s of distress noted.

## 2024-01-02 NOTE — Progress Notes (Signed)
 [facility] OFFICE PROGRESS NOTE  Savannah Norene HERO, DO  ASSESSMENT & PLAN:    Assessment & Plan Multiple myeloma, remission status unspecified (HCC) Patient is seen today prior to her Darzalex  (monthly), iron  and B12 infusion. She was recently hospitalized for pneumonia/acute on chronic respiratory failure and heart failure.  She was given diuretics and steroids with improvement of her symptoms. Dr. Kent wanted someone to lay eyes on her prior to her injection today. Assessment is unremarkable.  Lungs sound clear.  She has completed her antibiotics. Overall, she is feeling much better. Labs from 10 7 5  show improvement of her hemoglobin to 9.3.  Platelets 99,000.  Differential unremarkable. CMP shows elevated blood glucose 240. Proceed with treatment. Follow-up per Dr. Armanda note. Hyperglycemia Secondary to diabetes and steroid use. She is on Ozempic  but needs tighter control of her sugars especially during treatment.  We discussed reaching out to PCP.  10 units of NovoLog  today to bring her blood sugar down.  No orders of the defined types were placed in this encounter.   INTERVAL HISTORY: Patient returns for follow-up after recent hospitalization (12/24/2023 -12/28/23) for pneumonia (COVID infection) and acute on chronic respiratory failure.  She is scheduled for daratumumab  today and he would like her to be looked at prior to injection.  She has completed a steroid taper and diuretic.  She continues to be on 3 L of oxygen .  She also had downtrend in her hemoglobin and was given 2 units of blood.  Hemoglobin at discharge was 8.8.  She was started on IV octreotide  while hospitalized.  She is scheduled for outpatient colonoscopy.  Overall, patient states she is feeling much better.  Has some shortness of breath and cough still but overall this is stable.  We reviewed labs from 01/02/2024 which are overall stable.  SUMMARY OF HEMATOLOGIC HISTORY: Oncology  History  Multiple myeloma (HCC)  11/30/2021 Initial Diagnosis   Multiple myeloma (HCC)   11/30/2021 Cancer Staging   Staging form: Plasma Cell Myeloma and Plasma Cell Disorders, AJCC 8th Edition - Clinical stage from 11/30/2021: RISS Stage II (Beta-2 -microglobulin (mg/L): 2.8, Albumin (g/dL): 3, ISS: Stage II, High-risk cytogenetics: Absent, LDH: Normal) - Signed by Rogers Hai, MD on 11/30/2021 Histopathologic type: Multiple myeloma Stage prefix: Initial diagnosis Beta 2 microglobulin range (mg/L): Less than 3.5 Albumin range (g/dL): Less than 3.5 Cytogenetics: t(11;14) translocation Serum calcium  level: Normal   12/14/2021 -  Chemotherapy   Patient is on Treatment Plan : MYELOMA  Daratumumab  SQ + Lenalidomide  + Dexamethasone  (DaraRd) q28d        CBC    Component Value Date/Time   WBC 5.6 01/05/2024 1511   WBC 4.3 01/02/2024 1035   RBC 3.51 (L) 01/05/2024 1511   RBC 3.28 (L) 01/02/2024 1035   HGB 9.9 (L) 01/05/2024 1511   HCT 34.0 01/05/2024 1511   PLT 108 (L) 01/05/2024 1511   MCV 97 01/05/2024 1511   MCH 28.2 01/05/2024 1511   MCH 28.4 01/02/2024 1035   MCHC 29.1 (L) 01/05/2024 1511   MCHC 29.1 (L) 01/02/2024 1035   RDW 14.9 01/05/2024 1511   LYMPHSABS 1.3 01/05/2024 1511   MONOABS 0.4 01/02/2024 1035   EOSABS 0.0 01/05/2024 1511   BASOSABS 0.0 01/05/2024 1511       Latest Ref Rng & Units 01/05/2024    3:11 PM 01/02/2024   10:35 AM 12/28/2023    4:30 AM  CMP  Glucose 70 - 99 mg/dL 94  759  158   BUN 8 - 27 mg/dL 20  13  21    Creatinine 0.57 - 1.00 mg/dL 9.37  9.37  9.44   Sodium 134 - 144 mmol/L 143  140  144   Potassium 3.5 - 5.2 mmol/L 3.6  3.6  3.4   Chloride 96 - 106 mmol/L 102  101  103   CO2 20 - 29 mmol/L 29  30  32   Calcium  8.7 - 10.3 mg/dL 8.9  8.5  8.4   Total Protein 6.0 - 8.5 g/dL 5.6  5.2    Total Bilirubin 0.0 - 1.2 mg/dL 0.5  0.5    Alkaline Phos 49 - 135 IU/L 129  112    AST 0 - 40 IU/L 16  17    ALT 0 - 32 IU/L 20  31       Lab Results   Component Value Date   FERRITIN 57 12/24/2023   VITAMINB12 385 03/24/2021    There were no vitals filed for this visit.  Review of System:  Review of Systems  Constitutional:  Positive for malaise/fatigue.  Respiratory:  Positive for cough and shortness of breath.     Physical Exam: Physical Exam Constitutional:      Appearance: Normal appearance.  HENT:     Head: Normocephalic and atraumatic.  Eyes:     Pupils: Pupils are equal, round, and reactive to light.  Cardiovascular:     Rate and Rhythm: Normal rate and regular rhythm.     Heart sounds: Normal heart sounds. No murmur heard. Pulmonary:     Effort: Pulmonary effort is normal.     Breath sounds: Normal breath sounds. No wheezing.  Abdominal:     General: Bowel sounds are normal. There is no distension.     Palpations: Abdomen is soft.     Tenderness: There is no abdominal tenderness.  Musculoskeletal:        General: Normal range of motion.     Cervical back: Normal range of motion.  Skin:    General: Skin is warm and dry.     Findings: No rash.  Neurological:     Mental Status: She is alert and oriented to person, place, and time.     Gait: Gait is intact.  Psychiatric:        Mood and Affect: Mood and affect normal.        Cognition and Memory: Memory normal.        Judgment: Judgment normal.      I spent 20 minutes dedicated to the care of this patient (face-to-face and non-face-to-face) on the date of the encounter to include what is described in the assessment and plan.,  Delon Hope, NP 01/11/2024 3:32 PM

## 2024-01-02 NOTE — Patient Instructions (Signed)
 Visit Information  Thank you for taking time to visit with me today. Please don't hesitate to contact me if I can be of assistance to you before our next scheduled telephone appointment.  Following are the goals we discussed today:   Goals Addressed             This Visit's Progress    [facility] Transitions of Care (TOC) Care Plan       Problems:  Recent Hospitalization for treatment of acute hypoxic respiratory failure Equipment/DME barrier pt would like resources for assistance with walk in shower  Goal:  Over the next 30 days, the patient will not experience hospital readmission  Interventions:   Heart Failure Interventions: Provided education on low sodium diet Assessed need for readable accurate scales in home Provided education about placing scale on hard, flat surface Advised patient to weigh each morning after emptying bladder Discussed importance of daily weight and advised patient to weigh and record daily Reviewed role of diuretics in prevention of fluid overload and management of heart failure; Discussed the importance of keeping all appointments with provider Screening for signs and symptoms of depression related to chronic disease state  Assessed social determinant of health barriers  Care guide referral placed for resources- walk in shower  Patient Self Care Activities:  Attend all scheduled provider appointments Call pharmacy for medication refills 3-7 days in advance of running out of medications Call provider office for new concerns or questions  Notify RN Care Manager of TOC call rescheduling needs Participate in Transition of Care Program/Attend TOC scheduled calls Take medications as prescribed   call office if I gain more than 2 pounds in one day or 5 pounds in one week keep legs up while sitting watch for swelling in feet, ankles and legs every day weigh myself daily develop a rescue plan follow rescue plan if symptoms flare-up eat more whole grains,  fruits and vegetables, lean meats and healthy fats dress right for the weather, hot or cold  Plan:  Telephone follow up appointment with care management team member scheduled for:  01/09/24 @ 315 pm The patient has been provided with contact information for the care management team and has been advised to call with any health related questions or concerns.          Our next appointment is by telephone on 01/09/24 @ 315 pm  Please call the care guide team at [phone] if you need to cancel or reschedule your appointment.   If you are experiencing a Mental Health or Behavioral Health Crisis or need someone to talk to, please call the Suicide and Crisis Lifeline: 988 call the [place]  National Suicide Prevention Lifeline: 7017638175 or TTY: (740) 079-7332 TTY 936-799-8616) to talk to a trained counselor call 1-800-273-TALK (toll free, 24 hour hotline) go to [facility] [address], [place] [phone]) call 911   Patient verbalizes understanding of instructions and care plan provided today and agrees to view in MyChart. Active MyChart status and patient understanding of how to access instructions and care plan via MyChart confirmed with patient.     Telephone follow up appointment with care management team member scheduled for: 01/09/24 @ 315 pm  Mliss Creed [facility], BSN RN Care Manager/ Transition of Care [place]/ [facility] [phone]

## 2024-01-02 NOTE — Patient Instructions (Signed)
 [facility] [place] - A DEPT OF [name]  Discharge Instructions: Thank you for choosing [facility] to provide your oncology and hematology care.  If you have a lab appointment with the Cancer Center - please note that after April 8th, 2024, all labs will be drawn in the cancer center.  You do not have to check in or register with the main entrance as you have in the past but will complete your check-in in the cancer center.  Wear comfortable clothing and clothing appropriate for easy access to any Portacath or PICC line.   We strive to give you quality time with your provider. You may need to reschedule your appointment if you arrive late (15 or more minutes).  Arriving late affects you and other patients whose appointments are after yours.  Also, if you miss three or more appointments without notifying the office, you may be dismissed from the clinic at the provider's discretion.      For prescription refill requests, have your pharmacy contact our office and allow 72 hours for refills to be completed.    Today you received the following chemotherapy and/or immunotherapy agents darzalex    To help prevent nausea and vomiting after your treatment, we encourage you to take your nausea medication as directed.  BELOW ARE SYMPTOMS THAT SHOULD BE REPORTED IMMEDIATELY: *FEVER GREATER THAN 100.4 F (38 C) OR HIGHER *CHILLS OR SWEATING *NAUSEA AND VOMITING THAT IS NOT CONTROLLED WITH YOUR NAUSEA MEDICATION *UNUSUAL SHORTNESS OF BREATH *UNUSUAL BRUISING OR BLEEDING *URINARY PROBLEMS (pain or burning when urinating, or frequent urination) *BOWEL PROBLEMS (unusual diarrhea, constipation, pain near the anus) TENDERNESS IN MOUTH AND THROAT WITH OR WITHOUT PRESENCE OF ULCERS (sore throat, sores in mouth, or a toothache) UNUSUAL RASH, SWELLING OR PAIN  UNUSUAL VAGINAL DISCHARGE OR ITCHING   Items with * indicate a potential emergency and should be followed up as  soon as possible or go to the Emergency Department if any problems should occur.  Please show the CHEMOTHERAPY ALERT CARD or IMMUNOTHERAPY ALERT CARD at check-in to the Emergency Department and triage nurse.  Should you have questions after your visit or need to cancel or reschedule your appointment, please contact [facility] [place] - A DEPT OF Eligha Bridegroom [facility] [phone]  and follow the prompts.  Office hours are 8:00 a.m. to 4:30 p.m. Monday - Friday. Please note that voicemails left after 4:00 p.m. may not be returned until the following business day.  We are closed weekends and major holidays. You have access to a nurse at all times for urgent questions. Please call the main number to the clinic [phone] and follow the prompts.  For any non-urgent questions, you may also contact your provider using MyChart. We now offer e-Visits for anyone 2 and older to request care online for non-urgent symptoms. For details visit [email or web address].   Also download the MyChart app! Go to the app store, search "MyChart", open the app, select [place], and log in with your MyChart username and password.

## 2024-01-03 NOTE — Telephone Encounter (Signed)
 Tried to call the pt to schedule in office appt for preop clearance, no answer.

## 2024-01-04 ENCOUNTER — Telehealth: Payer: Self-pay

## 2024-01-04 ENCOUNTER — Encounter (HOSPITAL_COMMUNITY): Payer: Self-pay | Admitting: Oncology

## 2024-01-04 ENCOUNTER — Encounter: Payer: Self-pay | Admitting: Oncology

## 2024-01-04 NOTE — Progress Notes (Signed)
   Telephone encounter was:  Unsuccessful.  01/04/2024 Name: Savannah  PALLIE Burns MRN: [id number] DOB: 1950-04-23  Unsuccessful outbound call made today to assist with:  Home Modifications  Outreach Attempt:  1st Attempt  Unable to leave a message    [name] [facility]  [facility] Guide, Phone: [phone] Fax: [phone] Website: [email or web address]

## 2024-01-04 NOTE — Telephone Encounter (Signed)
 Copied from CRM [id number]. Topic: Clinical - Medication Question >> Jan 04, 2024  2:17 PM Willma R wrote: Reason for CRM: Marieta from Nurse Care Manager with [employer] is requesting to speak with Dr Melba nurse in regards to the patients medications.  Marieta can be reached at [phone]

## 2024-01-04 NOTE — Telephone Encounter (Signed)
 Pt has been scheduled to see Laymon Qua, PA-C, 01/17/24, 1:00.  Will route back to the requesting surgeon's office to make them aware.

## 2024-01-05 ENCOUNTER — Encounter: Payer: Self-pay | Admitting: Family Medicine

## 2024-01-05 ENCOUNTER — Ambulatory Visit: Admitting: Family Medicine

## 2024-01-05 VITALS — BP 139/60 | HR 76 | Temp 97.8°F | Ht 62.0 in | Wt 280.2 lb

## 2024-01-05 DIAGNOSIS — D649 Anemia, unspecified: Secondary | ICD-10-CM

## 2024-01-05 DIAGNOSIS — J9621 Acute and chronic respiratory failure with hypoxia: Secondary | ICD-10-CM

## 2024-01-05 DIAGNOSIS — U071 COVID-19: Secondary | ICD-10-CM

## 2024-01-05 DIAGNOSIS — J9601 Acute respiratory failure with hypoxia: Secondary | ICD-10-CM | POA: Diagnosis not present

## 2024-01-05 DIAGNOSIS — I5033 Acute on chronic diastolic (congestive) heart failure: Secondary | ICD-10-CM | POA: Diagnosis not present

## 2024-01-05 DIAGNOSIS — I152 Hypertension secondary to endocrine disorders: Secondary | ICD-10-CM

## 2024-01-05 DIAGNOSIS — E1159 Type 2 diabetes mellitus with other circulatory complications: Secondary | ICD-10-CM

## 2024-01-05 DIAGNOSIS — E1169 Type 2 diabetes mellitus with other specified complication: Secondary | ICD-10-CM

## 2024-01-05 DIAGNOSIS — K7469 Other cirrhosis of liver: Secondary | ICD-10-CM

## 2024-01-05 DIAGNOSIS — Z794 Long term (current) use of insulin: Secondary | ICD-10-CM

## 2024-01-05 DIAGNOSIS — Z09 Encounter for follow-up examination after completed treatment for conditions other than malignant neoplasm: Secondary | ICD-10-CM

## 2024-01-05 DIAGNOSIS — J441 Chronic obstructive pulmonary disease with (acute) exacerbation: Secondary | ICD-10-CM

## 2024-01-05 NOTE — Progress Notes (Addendum)
 Subjective:  Patient ID: Savannah  KANDICE Burns, female    DOB: 1950-09-24, 73 y.o.   MRN: [id number]  Patient Care Team: Jolinda Norene HERO, DO as PCP - General (Family Medicine) Alvan, Dorn FALCON, MD as PCP - Cardiology (Cardiology) Darlean Ned, MD (Rehabilitation) Shellia Oh, MD as Consulting Physician (Pulmonary Disease) Cindie Carlin POUR, DO as Consulting Physician (Internal Medicine) Edwena Parkin, NP as Nurse Practitioner (Nurse Practitioner) Gaile Frederick, MD as Referring Physician (Gastroenterology) Celestia Joesph SQUIBB, RN as Oncology Nurse Navigator (Medical Oncology) Bertrum Rosina HERO, RN as [facility] Management Eartha Angelia Sieving, MD as Consulting Physician (Gastroenterology) Darlean Ozell NOVAK, MD as Consulting Physician (Pulmonary Disease) Aura Mliss LABOR, RN as [facility] Care Management   Chief Complaint:  Hospitalization Follow-up (12/24/2023 - 12/28/2023 (4 days)/[facility]- Acute on chronic respiratory failure with hypoxia)   HPI: Savannah  PAULITA Burns is a 73 y.o. female presenting on 01/05/2024 for Hospitalization Follow-up (12/24/2023 - 12/28/2023 (4 days)/[facility]- Acute on chronic respiratory failure with hypoxia)  Today's visit was for Transitional Care Management.  The patient was discharged from [facility] on 12-28-2023 with a primary diagnosis of Acute Hypoxic Respiratory Failure (HCC).   Contact with the patient and/or caregiver, by a clinical staff member, was made on 01/02/2024 and was documented as a telephone encounter within the EMR.  Through chart review and discussion with the patient I have determined that management of their condition is of high complexity.       Savannah  SANTORIA Burns is a 73 year old female with a history of COVID-19, pneumonia, and acute on chronic respiratory failure who presents for follow-up after recent hospitalization.  She was recently hospitalized due to COVID-19, contracted from her  family. Initially, she felt 'lousy' and suspected pneumonia, a condition she experiences annually. During her stay, her oxygen  supplementation was increased to five liters, but she is currently on three liters at home, maintaining 100% oxygen  saturation. No current shortness of breath, fever, or chills.  During her hospital stay, she was diagnosed with pneumonia. She feels much better now and notes that her stool color has returned to normal, indicating no abnormal bleeding. No current fever, chills, or abnormal bleeding. Her stool is described as light brown, indicating no gastrointestinal bleeding.  She has a history of congestive heart failure and was diuresed during her hospitalization, resulting in a weight loss of twenty pounds. She is currently taking metolazone  as needed, although she is unsure of the exact frequency. She has resumed all her medications after previously stopping them.  She has diabetes but has not been checking her blood sugars due to misplacing her meter.  She has a diagnosis of cirrhosis and has an upcoming appointment with a gastroenterologist. She also has multiple other specialist appointments scheduled, including hematology/oncology and cardiology.      Hospital summary:  Hospital Course: Savannah  SINDIA Burns is a 73 y.o. female with medical history significant for CHF, COPD, chronic respiratory failure on 3 L, hypertension, multiple myeloma, GI bleed. Patient presented to the ED with complaints of 2 days of difficulty breathing and dry cough.  She also reports nausea and dizziness.  No chest pain.  She reports her baseline weight is about 398.  She does not check her weight regularly. She reports bilateral lower extremity swelling over the past several days. She has not been compliant with her Lasix , takes it on average 4 times a week, also on metolazone .  Patient had contact with her  son-in-law who had COVID.  Over the past 2 weeks she has had black stools.  No gross  blood in stools, no vomiting.  No abdominal pain.  She denies NSAID use.   ED Course: Temp.  100.  Heart rate 98-113. RR 20-27.  Blood pressure systolic 104-154.  O2 sats 91% on prior to admission on 3 L of O2.  O2 sats greater than 98% on 4 L. Lactic acid 1.2. BNP 55. Hemoglobin 6. X-ray-cardiomegaly with mild pulmonary vascular congestion. IV Lasix  40 mg x 1 given. Protonix  80 mg x 1 given. EDP talked to GI-follow, will see in consult, recommended starting ceftriaxone  and octreotide .     Acute on chronic respiratory failure with hypoxia - Much improved, currently satting 100% on 3 L of oxygen  - Patient reports she uses 3 L supplementation at baseline - On admission required up to 4 L of oxygen    - Patient exacerbation believed to be associated with decompensated CHF, mild COPD exacerbation, worsening anemia and also COVID infection.   -On steroids and IV diuretics >>> changing to p.o. -steroids will be tapered off as an outpatient     -GI/cardiology does not recommend inpatient EGD or colonoscopy due to patient's acute respiratory distress - Continue IV Protonix  and IV octreotide . - 2D echo demonstrating ejection fraction 60 to 65%, grade 1 diastolic dysfunction and mild left ventricle hypertrophy.  No wall motion abnormalities.   - Continue bronchodilators, antitussives and the use of incentive spirometer as ordered.   Diabetes - Continue Ozempic  while inpatient - Continue sliding scale insulin  and follow CBG fluctuation.   Hypertension - Continue current antihypertensive agents - Follow vital signs and adjust medication as needed.   COPD with mild exacerbation - Continue bronchodilator management and the use of steroids - Oxygen  supplementation down to 3 L (baseline). - No significant wheezing on today's examination.   Acute on chronic anemia- pancytopenia with  History of liver cirrhosis/thrombocytopenia - Patient with prior history of esophageal varices and chronic  thrombocytopenia - Avoid the use of heparin . - Continue PPI - Follow hemoglobin trend a, s/p 2U PRBC blood transfusion 12/26/2023   Relevant past medical, surgical, family, and social history reviewed and updated as indicated.  Allergies and medications reviewed and updated. Data reviewed: Chart in Epic.   Past Medical History:  Diagnosis Date   Adrenal adenoma, left    Stable   Anxiety    Arthritis    bilateral hands   COPD (chronic obstructive pulmonary disease) (HCC)    Depression    Diabetes mellitus, type 2 (HCC) 08/12/2008   Qualifier: Diagnosis of  By: Jenetta MD, Talia     Dyspnea    Esophageal varices (HCC)    Grade II diastolic dysfunction    History of kidney stones    Hyperlipidemia    Hypertension    Lower back pain    Lower GI bleed 03/19/2020   Panic attacks    Pneumonia    currently taking antibiotic and prednisone  for early stages of pneumonia   Pulmonary nodules    bilateral   Skin cancer    face    Past Surgical History:  Procedure Laterality Date   BIOPSY  04/07/2020   Procedure: BIOPSY;  Surgeon: Cindie Carlin POUR, DO;  [facility];  Service: Endoscopy;;   Breast Cystectomy  Right    CESAREAN SECTION     COLONOSCOPY WITH PROPOFOL  N/A 01/25/2020   Dr. Cindie: Nonbleeding internal hemorrhoids, diverticulosis, 5 mm polyp removed  from the ascending colon, 10 mm polyp removed from the sigmoid colon, 30 mm polyp (tubulovillous adenoma with no high-grade dysplasia) removed from the transverse colon via piecemeal status post tattoo.  Other polyps were tubular adenomas.  3 month surveillance colonoscopy recommended.   COLONOSCOPY WITH PROPOFOL  N/A 04/07/2020   Procedure: COLONOSCOPY WITH PROPOFOL ;  Surgeon: Cindie Carlin POUR, DO;  [facility]: [facility];  Service: Endoscopy;  Laterality: N/A;  3:00pm, pt knows new time per office   CYSTOSCOPY/URETEROSCOPY/HOLMIUM LASER/STENT PLACEMENT Bilateral 03/01/2019   Procedure:  CYSTOSCOPY/RETROGRADEURETEROSCOPY/HOLMIUM LASER/STENT PLACEMENT;  Surgeon: Devere Lonni Righter, MD;  Location: [facility];  Service: Urology;  Laterality: Bilateral;  ONLY NEEDS 60 MIN   ESOPHAGOGASTRODUODENOSCOPY (EGD) WITH PROPOFOL  N/A 01/25/2020   Dr. Cindie: 4 columns grade 1 esophageal varices   ESOPHAGOGASTRODUODENOSCOPY (EGD) WITH PROPOFOL  N/A 05/18/2020   Procedure: ESOPHAGOGASTRODUODENOSCOPY (EGD) WITH PROPOFOL ;  Surgeon: Cindie Carlin POUR, DO;  [facility];  Service: Endoscopy;  Laterality: N/A;   ESOPHAGOGASTRODUODENOSCOPY (EGD) WITH PROPOFOL  N/A 07/28/2020   Procedure: ESOPHAGOGASTRODUODENOSCOPY (EGD) WITH PROPOFOL ;  Surgeon: Cindie Carlin POUR, DO;  [facility];  Service: Endoscopy;  Laterality: N/A;  am or early PM due to givens capsule placement   GIVENS CAPSULE STUDY N/A 05/18/2020   Procedure: GIVENS CAPSULE STUDY;  Surgeon: Eartha Angelia Sieving, MD;  [facility];  Service: Gastroenterology;  Laterality: N/A;   GIVENS CAPSULE STUDY N/A 07/28/2020   Procedure: GIVENS CAPSULE STUDY;  Surgeon: Cindie Carlin POUR, DO;  [facility];  Service: Endoscopy;  Laterality: N/A;   IR IMAGING GUIDED PORT INSERTION  12/02/2021   IRRIGATION AND DEBRIDEMENT ABSCESS Right 09/28/2022   Procedure: IRRIGATION AND DEBRIDEMENT ABSCESS;  Surgeon: Evonnie Dorothyann LABOR, DO;  Location: [facility];  Service: General;  Laterality: Right;   POLYPECTOMY  01/25/2020   Procedure: POLYPECTOMY;  Surgeon: Cindie Carlin POUR, DO;  [facility];  Service: Endoscopy;;   POLYPECTOMY  04/07/2020   Procedure: POLYPECTOMY INTESTINAL;  Surgeon: Cindie Carlin POUR, DO;  [facility];  Service: Endoscopy;;   PORT-A-CATH REMOVAL Right 09/28/2022   Procedure: MINOR REMOVAL PORT-A-CATH;  Surgeon: Evonnie Dorothyann LABOR, DO;  Location: [facility];  Service: General;  Laterality: Right;   PORTACATH PLACEMENT Right 04/12/2023   Procedure: INSERTION PORT-A-CATH, RIJ;  Surgeon:  Evonnie Dorothyann LABOR, DO;  Location: [facility];  Service: General;  Laterality: Right;   SKIN CANCER EXCISION     Face   SPINE SURGERY     SUBMUCOSAL TATTOO INJECTION  01/25/2020   Procedure: SUBMUCOSAL TATTOO INJECTION;  Surgeon: Cindie Carlin POUR, DO;  [facility];  Service: Endoscopy;;    Social History   Socioeconomic History   Marital status: Widowed    Spouse name: Not on file   Number of children: 2   Years of education: 14   Highest education level: Not on file  Occupational History   Occupation: Retired   Tobacco Use   Smoking status: Former    Current packs/day: 0.00    Average packs/day: 1.5 packs/day for 40.0 years (60.0 ttl pk-yrs)    Types: Cigarettes    Start date: 04/29/1975    Quit date: 04/29/2015    Years since quitting: 8.6   Smokeless tobacco: Never   Tobacco comments:    Quit smoking 04/2015- Previous 1.5 ppd smoker  Vaping Use   Vaping status: Never Used  Substance and Sexual Activity   Alcohol  use: No    Alcohol /week: 0.0 standard  drinks of alcohol    Drug use: No   Sexual activity: Not Currently    Birth control/protection: Post-menopausal  Other Topics Concern   Not on file  Social History Narrative   Her 9 year old granddaughter lives with her - one daughter lives nearby, but she doesn't have a good relationship with her. Has a great relationship with other daughter who lives 1.5 hrs away - talks to her daily on the phone.   Social Drivers of Corporate investment banker Strain: Low Risk  (06/12/2023)   Overall Financial Resource Strain (CARDIA)    Difficulty of Paying Living Expenses: Not very hard  Food Insecurity: No Food Insecurity (01/02/2024)   Hunger Vital Sign    Worried About Running Out of Food in the Last Year: Never true    Ran Out of Food in the Last Year: Never true  Transportation Needs: No Transportation Needs (01/02/2024)   PRAPARE - Administrator, Civil Service (Medical): No    Lack of Transportation  (Non-Medical): No  Physical Activity: Inactive (01/27/2023)   Exercise Vital Sign    Days of Exercise per Week: 0 days    Minutes of Exercise per Session: 0 min  Stress: No Stress Concern Present (01/27/2023)   [place] Burns of Occupational Health - Occupational Stress Questionnaire    Feeling of Stress : Not at all  Social Connections: Socially Isolated (12/24/2023)   Social Connection and Isolation Panel    Frequency of Communication with Friends and Family: More than three times a week    Frequency of Social Gatherings with Friends and Family: More than three times a week    Attends Religious Services: Never    Database administrator or Organizations: No    Attends Banker Meetings: Never    Marital Status: Widowed  Intimate Partner Violence: Not At Risk (01/02/2024)   Humiliation, Afraid, Rape, and Kick questionnaire    Fear of Current or Ex-Partner: No    Emotionally Abused: No    Physically Abused: No    Sexually Abused: No    Outpatient Encounter Medications as of 01/05/2024  Medication Sig   acyclovir  (ZOVIRAX ) 400 MG tablet Take 1 tablet (400 mg total) by mouth 2 (two) times daily.   albuterol  (PROVENTIL ) (2.5 MG/3ML) 0.083% nebulizer solution Take 3 mLs (2.5 mg total) by nebulization every 4 (four) hours as needed for wheezing or shortness of breath.   albuterol  (VENTOLIN  HFA) 108 (90 Base) MCG/ACT inhaler Inhale 2 puffs into the lungs every 6 (six) hours as needed for wheezing or shortness of breath.   bumetanide  (BUMEX ) 1 MG tablet Take 2 tablets (2 mg total) by mouth daily.   desvenlafaxine  (PRISTIQ ) 100 MG 24 hr tablet Take 100 mg by mouth in the morning.   ferrous sulfate  325 (65 FE) MG tablet Take 1 tablet (325 mg total) by mouth daily with breakfast.   fluticasone -salmeterol (ADVAIR) 100-50 MCG/ACT AEPB Inhale 1 puff into the lungs 2 (two) times daily.   furosemide  (LASIX ) 40 MG tablet Take 1 tablet (40 mg total) by mouth daily.    guaiFENesin -dextromethorphan  (ROBITUSSIN DM) 100-10 MG/5ML syrup Take 10 mLs by mouth every 4 (four) hours as needed for cough.   lidocaine -prilocaine  (EMLA ) cream Apply 1 Application topically as needed. Apply a small amount to port a cath site and cover with plastic wrap 1 hour prior to infusion appointments   lovastatin  (MEVACOR ) 20 MG tablet Take 1 tablet (20 mg total) by mouth  at bedtime.   methylPREDNISolone  (MEDROL  DOSEPAK) 4 MG TBPK tablet Medrol  Dosepak take as instructed   metolazone  (ZAROXOLYN ) 2.5 MG tablet Take 1 tablet (2.5 mg total) by mouth daily as needed.   nystatin  (MYCOSTATIN /NYSTOP ) powder Apply 1 Application topically 2 (two) times daily.   oxyCODONE -acetaminophen  (PERCOCET) 10-325 MG tablet Take 1 tablet by mouth 4 (four) times daily as needed for pain.   OXYGEN  Inhale 5 L into the lungs continuous. (Patient taking differently: Inhale 3 L into the lungs continuous.)   pantoprazole  (PROTONIX ) 40 MG tablet Take 1 tablet (40 mg total) by mouth 2 (two) times daily.   potassium chloride  SA (KLOR-CON  M) 20 MEQ tablet Take 1 tablet (20 mEq total) by mouth 2 (two) times daily.   No facility-administered encounter medications on file as of 01/05/2024.    Allergies  Allergen Reactions   Keflex  [Cephalexin ] Nausea And Vomiting    Pertinent ROS per HPI, otherwise unremarkable      Objective:  BP 139/60   Pulse 76   Temp 97.8 F (36.6 C)   Ht 5' 2 (1.575 m)   Wt 280 lb 3.2 oz (127.1 kg)   SpO2 95%   BMI 51.25 kg/m    Wt Readings from Last 3 Encounters:  01/05/24 280 lb 3.2 oz (127.1 kg)  12/28/23 287 lb 14.7 oz (130.6 kg)  12/05/23 293 lb (132.9 kg)    Physical Exam Vitals and nursing note reviewed.  Constitutional:      General: She is not in acute distress.    Appearance: She is morbidly obese. She is ill-appearing (chronically ill). She is not toxic-appearing or diaphoretic.  HENT:     Head: Normocephalic and atraumatic.     Nose: Nose normal.      Mouth/Throat:     Mouth: Mucous membranes are moist.  Eyes:     Conjunctiva/sclera: Conjunctivae normal.     Pupils: Pupils are equal, round, and reactive to light.  Cardiovascular:     Rate and Rhythm: Normal rate and regular rhythm.     Heart sounds: Normal heart sounds.  Pulmonary:     Effort: Pulmonary effort is normal.     Breath sounds: Decreased breath sounds and wheezing (minimale to bases) present.     Comments: O2 via [place] at 3L/min Musculoskeletal:     Cervical back: Neck supple.     Right lower leg: Edema present.     Left lower leg: Edema present.  Neurological:     General: No focal deficit present.     Mental Status: She is alert and oriented to person, place, and time.     Motor: Weakness (generalized) present.     Gait: Gait abnormal (in wheelchair).       Results for orders placed or performed in visit on 01/02/24  Comprehensive metabolic panel   Collection Time: 01/02/24 10:35 AM  Result Value Ref Range   Sodium 140 135 - 145 mmol/L   Potassium 3.6 3.5 - 5.1 mmol/L   Chloride 101 98 - 111 mmol/L   CO2 30 22 - 32 mmol/L   Glucose, Bld 240 (H) 70 - 99 mg/dL   BUN 13 8 - 23 mg/dL   Creatinine, Ser 9.37 0.44 - 1.00 mg/dL   Calcium  8.5 (L) 8.9 - 10.3 mg/dL   Total Protein 5.2 (L) 6.5 - 8.1 g/dL   Albumin 3.6 3.5 - 5.0 g/dL   AST 17 15 - 41 U/L   ALT 31 0 - 44 U/L  Alkaline Phosphatase 112 38 - 126 U/L   Total Bilirubin 0.5 0.0 - 1.2 mg/dL   GFR, Estimated >39 >39 mL/min   Anion gap 9 5 - 15  Magnesium    Collection Time: 01/02/24 10:35 AM  Result Value Ref Range   Magnesium  2.2 1.7 - 2.4 mg/dL  CBC with Differential   Collection Time: 01/02/24 10:35 AM  Result Value Ref Range   WBC 4.3 4.0 - 10.5 K/uL   RBC 3.28 (L) 3.87 - 5.11 MIL/uL   Hemoglobin 9.3 (L) 12.0 - 15.0 g/dL   HCT 67.9 (L) 63.9 - 53.9 %   MCV 97.6 80.0 - 100.0 fL   MCH 28.4 26.0 - 34.0 pg   MCHC 29.1 (L) 30.0 - 36.0 g/dL   RDW 84.0 (H) 88.4 - 84.4 %   Platelets 99 (L) 150 - 400  K/uL   nRBC 0.0 0.0 - 0.2 %   Neutrophils Relative % 61 %   Neutro Abs 2.6 1.7 - 7.7 K/uL   Lymphocytes Relative 28 %   Lymphs Abs 1.2 0.7 - 4.0 K/uL   Monocytes Relative 10 %   Monocytes Absolute 0.4 0.1 - 1.0 K/uL   Eosinophils Relative 1 %   Eosinophils Absolute 0.0 0.0 - 0.5 K/uL   Basophils Relative 0 %   Basophils Absolute 0.0 0.0 - 0.1 K/uL   Smear Review See Note    Immature Granulocytes 0 %   Abs Immature Granulocytes 0.01 0.00 - 0.07 K/uL   Large Granular Lymphocytes PRESENT    Reactive, Benign Lymphocytes PRESENT    Ovalocytes PRESENT    Stomatocytes PRESENT   Sample to Blood Bank   Collection Time: 01/02/24 10:35 AM  Result Value Ref Range   Blood Bank Specimen SAMPLE AVAILABLE FOR TESTING    Sample Expiration      01/05/2024,2359 Performed at [facility], [address] [place], [place] [address]    *Note: Due to a large number of results and/or encounters for the requested time period, some results have not been displayed. A complete set of results can be found in Results Review.       Pertinent labs & imaging results that were available during my care of the patient were reviewed by me and considered in my medical decision making.  Assessment & Plan:  Savannah Burns  was seen today for hospitalization follow-up.  Diagnoses and all orders for this visit:  Acute respiratory failure with hypoxia (HCC) -     CBC with Differential/Platelet -     CMP14+EGFR  Acute on chronic respiratory failure with hypoxia (HCC) -     CBC with Differential/Platelet -     CMP14+EGFR  Acute on chronic diastolic congestive heart failure (HCC) -     CBC with Differential/Platelet -     CMP14+EGFR  Acute on chronic anemia -     CBC with Differential/Platelet -     CMP14+EGFR  COVID-19 virus infection -     CBC with Differential/Platelet -     CMP14+EGFR  Type 2 diabetes mellitus with other specified complication, without long-term current use of insulin  (HCC) -     CBC with  Differential/Platelet -     CMP14+EGFR  Chronic obstructive pulmonary disease with acute exacerbation (HCC) -     CBC with Differential/Platelet -     CMP14+EGFR  Other cirrhosis of liver (HCC) -     CBC with Differential/Platelet -     CMP14+EGFR  Hypertension associated with diabetes (HCC) -  CBC with Differential/Platelet [facility] discharge follow-up -     CBC with Differential/Platelet -     CMP14+EGFR       Acute and chronic respiratory failure with hypoxia Admitted for acute on chronic respiratory failure with hypoxia. Currently on 3 liters of oxygen  at baseline, maintaining 100% saturation. During hospitalization, oxygen  requirement increased to 5 liters due to illness. No current shortness of breath reported. - Continue oxygen  therapy at 3 liters per minute - Monitor oxygen  saturation at home  COVID-19 infection and pneumonia Tested positive for COVID-19 and subsequently developed pneumonia, a recurrent issue. Hospitalized for management. Currently reports feeling much better with no fever, chills, or shortness of breath.  Congestive heart failure, acute on chronic CHF is acute on chronic with no worsening reported. Diuresed during hospitalization, resulting in a 20-pound weight loss. Currently taking metolazone  as needed for increased fluid retention, but no specific instructions provided by the previous doctor. Typically, metolazone  is used for three days prior to Lasix  to enhance diuresis. - Use metolazone  as needed for increased fluid retention, typically for thirty minutes prior to Lasix  - Order CBC and CMP to monitor kidney function  Acute and chronic anemia Diagnosed with acute and chronic anemia. Scheduled to see GI for further evaluation. No abnormal bleeding or dark stools reported since discharge. Stool color has returned to normal. - Order CBC and CMP to monitor hemoglobin levels and kidney function - Proceed with scheduled GI  appointment  Type 2 diabetes mellitus Diabetes management is suboptimal as she has not been checking blood sugars. Recently resumed all medications after stopping them due to personal reasons. Plans to start monitoring blood sugars again. - Resume blood sugar monitoring - Continue diabetes medications as prescribed  Cirrhosis of liver Diagnosis of cirrhosis with a scheduled appointment with GI for further management. - Proceed with scheduled GI appointment          Continue all other maintenance medications.  Follow up plan: Return if symptoms worsen or fail to improve.  Keep follow up with Dr. Jolinda next month     The above assessment and management plan was discussed with the patient. The patient verbalized understanding of and has agreed to the management plan. Patient is aware to call the clinic if they develop any new symptoms or if symptoms persist or worsen. Patient is aware when to return to the clinic for a follow-up visit. Patient educated on when it is appropriate to go to the emergency department.   Rosaline Bruns, FNP-C [facility] [phone]

## 2024-01-06 ENCOUNTER — Encounter: Payer: Self-pay | Admitting: Oncology

## 2024-01-06 LAB — CMP14+EGFR
ALT: 20 IU/L (ref 0–32)
AST: 16 IU/L (ref 0–40)
Albumin: 3.9 g/dL (ref 3.8–4.8)
Alkaline Phosphatase: 129 IU/L (ref 49–135)
BUN/Creatinine Ratio: 32 — ABNORMAL HIGH (ref 12–28)
BUN: 20 mg/dL (ref 8–27)
Bilirubin Total: 0.5 mg/dL (ref 0.0–1.2)
CO2: 29 mmol/L (ref 20–29)
Calcium: 8.9 mg/dL (ref 8.7–10.3)
Chloride: 102 mmol/L (ref 96–106)
Creatinine, Ser: 0.62 mg/dL (ref 0.57–1.00)
Globulin, Total: 1.7 g/dL (ref 1.5–4.5)
Glucose: 94 mg/dL (ref 70–99)
Potassium: 3.6 mmol/L (ref 3.5–5.2)
Sodium: 143 mmol/L (ref 134–144)
Total Protein: 5.6 g/dL — ABNORMAL LOW (ref 6.0–8.5)
eGFR: 93 mL/min/1.73 (ref >59)

## 2024-01-06 LAB — CBC WITH DIFFERENTIAL/PLATELET
Basophils Absolute: 0 x10E3/uL (ref 0.0–0.2)
Basos: 0 %
EOS (ABSOLUTE): 0 x10E3/uL (ref 0.0–0.4)
Eos: 1 %
Hematocrit: 34 % (ref 34.0–46.6)
Hemoglobin: 9.9 g/dL — ABNORMAL LOW (ref 11.1–15.9)
Immature Grans (Abs): 0 x10E3/uL (ref 0.0–0.1)
Immature Granulocytes: 0 %
Lymphocytes Absolute: 1.3 x10E3/uL (ref 0.7–3.1)
Lymphs: 23 %
MCH: 28.2 pg (ref 26.6–33.0)
MCHC: 29.1 g/dL — ABNORMAL LOW (ref 31.5–35.7)
MCV: 97 fL (ref 79–97)
Monocytes Absolute: 0.6 x10E3/uL (ref 0.1–0.9)
Monocytes: 11 %
Neutrophils Absolute: 3.6 x10E3/uL (ref 1.4–7.0)
Neutrophils: 65 %
Platelets: 108 x10E3/uL — ABNORMAL LOW (ref 150–450)
RBC: 3.51 x10E6/uL — ABNORMAL LOW (ref 3.77–5.28)
RDW: 14.9 % (ref 11.7–15.4)
WBC: 5.6 x10E3/uL (ref 3.4–10.8)

## 2024-01-08 ENCOUNTER — Telehealth: Payer: Self-pay

## 2024-01-08 ENCOUNTER — Ambulatory Visit: Payer: Self-pay | Admitting: Family Medicine

## 2024-01-08 NOTE — Telephone Encounter (Signed)
 Spoke with [facility] at [facility]. Nothing needed. Patient seen in office last week. [facility] provides care management for oncology patients and is coordinated by patients insurance.

## 2024-01-08 NOTE — Progress Notes (Signed)
   Telephone encounter was:  Unsuccessful.  01/08/2024 Name: Savannah Burns  Savannah Burns MRN: [id number] DOB: [birth year]  Unsuccessful outbound call made today to assist with:  Home Modifications  Outreach Attempt:  2nd Attempt  No answer and unable to leave a message    [name] [facility]  [facility] Guide, Phone: [phone] Fax: [phone] Website: [email or web address]

## 2024-01-08 NOTE — Telephone Encounter (Signed)
 Savannah Burns with [facility] called in returning a call to Savannah Burns. I called and spoke with Savannah Burns who transferred me to Savannah Burns. Savannah Burns then had me transfer the patient for further assistance.

## 2024-01-08 NOTE — Telephone Encounter (Signed)
 Returned call. Marta unavailable.

## 2024-01-09 ENCOUNTER — Encounter: Payer: Self-pay | Admitting: *Deleted

## 2024-01-09 ENCOUNTER — Telehealth: Payer: Self-pay

## 2024-01-09 ENCOUNTER — Telehealth: Admitting: *Deleted

## 2024-01-09 ENCOUNTER — Telehealth: Payer: Self-pay | Admitting: *Deleted

## 2024-01-09 NOTE — Telephone Encounter (Signed)
 Yes, that is fine.

## 2024-01-09 NOTE — Telephone Encounter (Signed)
 Copied from CRM [id number]. Topic: Clinical - Medication Question >> Jan 09, 2024 10:54 AM Ahlexyia S wrote: Reason for CRM: Pt called I stating she was in office previously and was prescribed ferrous sulfate  325 (65 FE) MG tablet. Pt states the medication makes her really sick and would like to know if it can be changed to something else. Informed pt someone will reach out shortly.

## 2024-01-09 NOTE — Progress Notes (Signed)
   Telephone encounter was:  Unsuccessful.  01/09/2024 Name: Savannah  RASEEL Burns MRN: [id number] DOB: 1950/08/05  Unsuccessful outbound call made today to assist with:  Home Modifications  Outreach Attempt:  2nd Attempt  No answer and unable to leave a message    [name] [facility]  [facility] Guide, Phone: [phone] Fax: [phone] Website: [email or web address]

## 2024-01-09 NOTE — Telephone Encounter (Signed)
 Patient aware and would like to know if she can do a video visit with PCP

## 2024-01-09 NOTE — Telephone Encounter (Signed)
 Reports the Furosemide  is making her sick and she is taking it with food, not on empty stomach,

## 2024-01-09 NOTE — Telephone Encounter (Signed)
 Rosaline saw patient for HFU on 01/05/24. Rosaline, can you advise on this? Can alternative medication be sent in for patient?

## 2024-01-09 NOTE — Telephone Encounter (Signed)
 Our office did not prescribe this medication to the patient. Looks like Shahmehdi, Adriana LABOR, MD did. Patient would need to call them and request Rx change.

## 2024-01-10 ENCOUNTER — Encounter: Payer: Self-pay | Admitting: Family Medicine

## 2024-01-10 ENCOUNTER — Encounter: Payer: Self-pay | Admitting: *Deleted

## 2024-01-10 ENCOUNTER — Encounter (INDEPENDENT_AMBULATORY_CARE_PROVIDER_SITE_OTHER): Payer: Self-pay | Admitting: Gastroenterology

## 2024-01-10 ENCOUNTER — Telehealth (INDEPENDENT_AMBULATORY_CARE_PROVIDER_SITE_OTHER): Admitting: Family Medicine

## 2024-01-10 DIAGNOSIS — I5032 Chronic diastolic (congestive) heart failure: Secondary | ICD-10-CM

## 2024-01-10 NOTE — Progress Notes (Signed)
 Could not connect virtually.  Apparently the furosemide  has been what been causing her stomach upset not her ferrous sulfate .  From our notes all I can find is this being prescribed back in March 2025 and then it looks like it was replaced with Bumex  by her oncologist back in May.  We discussed that she should be on the Bumex  not on the furosemide  for fluid and she will get her granddaughter to find that bottle for her instead.  She is compliant with her potassium.

## 2024-01-11 ENCOUNTER — Encounter (HOSPITAL_COMMUNITY): Payer: Self-pay | Admitting: Oncology

## 2024-01-11 ENCOUNTER — Telehealth: Payer: Self-pay | Admitting: *Deleted

## 2024-01-11 ENCOUNTER — Encounter: Payer: Self-pay | Admitting: Oncology

## 2024-01-11 DIAGNOSIS — Z8709 Personal history of other diseases of the respiratory system: Secondary | ICD-10-CM | POA: Insufficient documentation

## 2024-01-11 DIAGNOSIS — R739 Hyperglycemia, unspecified: Secondary | ICD-10-CM | POA: Insufficient documentation

## 2024-01-11 NOTE — Patient Instructions (Signed)
 Visit Information  Thank you for taking time to visit with me today. Please don't hesitate to contact me if I can be of assistance to you before our next scheduled telephone appointment.  Following are the goals we discussed today:   Goals Addressed             This Visit's Progress    VBCI Transitions of Care (TOC) Care Plan       Problems:  Recent Hospitalization for treatment of acute hypoxic respiratory failure Equipment/DME barrier pt would like resources for assistance with walk in shower 01/11/24- pt reports she is weighing daily, states  I've lost a lot of fluid,  pt states she had telemedicine visit yesterday and instructed to take Bumex  and discontinue furosemide  due to this was causing stomach upset, pt denies stomach upset today, pt reports she is not checking CBG at home but will try to start.  Goal:  Over the next 30 days, the patient will not experience hospital readmission  Interventions:   Heart Failure Interventions: Advised patient to weigh each morning after emptying bladder Discussed importance of daily weight and advised patient to weigh and record daily Reviewed role of diuretics in prevention of fluid overload and management of heart failure; Discussed the importance of keeping all appointments with provider Reinforced HF action plan Care guide attempted to reach pt several times and unable to reach,  RN CM provided care guide contact # [phone]  Patient Self Care Activities:  Attend all scheduled provider appointments Call pharmacy for medication refills 3-7 days in advance of running out of medications Call provider office for new concerns or questions  Notify RN Care Manager of TOC call rescheduling needs Participate in Transition of Care Program/Attend TOC scheduled calls Take medications as prescribed   call office if I gain more than 2 pounds in one day or 5 pounds in one week keep legs up while sitting watch for swelling in feet, ankles and  legs every day weigh myself daily develop a rescue plan follow rescue plan if symptoms flare-up eat more whole grains, fruits and vegetables, lean meats and healthy fats dress right for the weather, hot or cold Please start checking your blood sugar  Plan:  Telephone follow up appointment with care management team member scheduled for:  01/19/24 @ 930 am The patient has been provided with contact information for the care management team and has been advised to call with any health related questions or concerns.          Our next appointment is by telephone on 01/19/24 @ 930 am  Please call the care guide team at [phone] if you need to cancel or reschedule your appointment.   If you are experiencing a Mental Health or Behavioral Health Crisis or need someone to talk to, please call the Suicide and Crisis Lifeline: 988 call the [place]  National Suicide Prevention Lifeline: 256-610-4366 or TTY: 639 159 9208 TTY (442) 848-8896) to talk to a trained counselor call 1-800-273-TALK (toll free, 24 hour hotline) go to [facility] [address] [place] [phone]) call 911   Patient verbalizes understanding of instructions and care plan provided today and agrees to view in MyChart. Active MyChart status and patient understanding of how to access instructions and care plan via MyChart confirmed with patient.     Telephone follow up appointment with care management team member scheduled for:  01/19/24 @ 930 am  Mliss [facility], BSN RN Care Manager/ Transition of Care [place]/  [facility] [phone]

## 2024-01-11 NOTE — Assessment & Plan Note (Addendum)
 Secondary to diabetes and steroid use. She is on Ozempic  but needs tighter control of her sugars especially during treatment.  We discussed reaching out to PCP.  10 units of NovoLog  today to bring her blood sugar down.

## 2024-01-11 NOTE — Patient Outreach (Signed)
 Transition of Care week 2  Visit Note  01/11/2024  Name: Savannah Burns  ZAMIRAH DENNY MRN: [id number]          DOB: 11/22/1950  Situation: Patient enrolled in [facility] 30-day program. Visit completed with patient by telephone.   Background:  Discharge Date and Diagnosis: 12/28/23, Acute on chronic respiratory failure with hypoxia   Past Medical History:  Diagnosis Date   Adrenal adenoma, left    Stable   Anxiety    Arthritis    bilateral hands   COPD (chronic obstructive pulmonary disease) (HCC)    Depression    Diabetes mellitus, type 2 (HCC) 08/12/2008   Qualifier: Diagnosis of  By: Jenetta MD, Talia     Dyspnea    Esophageal varices (HCC)    Grade II diastolic dysfunction    History of kidney stones    Hyperlipidemia    Hypertension    Lower back pain    Lower GI bleed 03/19/2020   Panic attacks    Pneumonia    currently taking antibiotic and prednisone  for early stages of pneumonia   Pulmonary nodules    bilateral   Skin cancer    face    Assessment: Patient Reported Symptoms: Cognitive Cognitive Status: No symptoms reported, Alert and oriented to person, place, and time, Normal speech and language skills, Able to follow simple commands      Neurological Neurological Review of Symptoms: No symptoms reported    HEENT HEENT Symptoms Reported: No symptoms reported      Cardiovascular Cardiovascular Symptoms Reported: Swelling in legs or feet (pt reports have a little swelling, much better) Does patient have uncontrolled Hypertension?: No Cardiovascular Management Strategies: Adequate rest, Medication therapy, Routine screening Weight: 280 lb (127 kg) Cardiovascular Self-Management Outcome: 4 (good) Cardiovascular Comment: reinforced HF action plan  Respiratory Respiratory Symptoms Reported: No symptoms reported Additional Respiratory Details: oxygen  at 3 liters continuously Respiratory Management Strategies: Adequate rest, Routine screening Respiratory Self-Management  Outcome: 4 (good)  Endocrine Endocrine Symptoms Reported: No symptoms reported Is patient diabetic?: No    Gastrointestinal Gastrointestinal Symptoms Reported: No symptoms reported      Genitourinary Genitourinary Symptoms Reported: No symptoms reported    Integumentary Integumentary Symptoms Reported: No symptoms reported    Musculoskeletal Musculoskelatal Symptoms Reviewed: Limited mobility Additional Musculoskeletal Details: walker        Psychosocial Psychosocial Symptoms Reported: No symptoms reported         There were no vitals filed for this visit.  Medications Reviewed Today     Reviewed by Aura Mliss LABOR, RN (Registered Nurse) on 01/11/24 at 1001  Med List Status: <None>   Medication Order Taking? Sig Documenting Provider Last Dose Status Informant  acyclovir  (ZOVIRAX ) 400 MG tablet [id number]  Take 1 tablet (400 mg total) by mouth 2 (two) times daily. Davonna Siad, MD  Active Family Member, Pharmacy Records, Child  albuterol  (PROVENTIL ) (2.5 MG/3ML) 0.083% nebulizer solution [id number]  Take 3 mLs (2.5 mg total) by nebulization every 4 (four) hours as needed for wheezing or shortness of breath. Pearlean Manus, MD  Active Family Member, Pharmacy Records, Child  albuterol  (VENTOLIN  HFA) 108 [phone] Base) MCG/ACT inhaler [id number]  Inhale 2 puffs into the lungs every 6 (six) hours as needed for wheezing or shortness of breath. Pearlean Manus, MD  Active Family Member, Pharmacy Records, Child  bumetanide  (BUMEX ) 1 MG tablet [id number]  Take 2 tablets (2 mg total) by mouth daily. Rogers Hai, MD  Active Family Member, Pharmacy Records, Child  Med Note (WARD, ANGELICA G   Mon Dec 25, 2023  5:01 PM) Pt has not taken in over 30 days, pt's granddaughter and daughter state that is d/t noncompliance from the pt   desvenlafaxine  (PRISTIQ ) 100 MG 24 hr tablet [id number]  Take 100 mg by mouth in the morning. [provider]  Active Family Member, Pharmacy  Records, Child           Med Note (WARD, CHUCK KANDICE Kitchens Dec 25, 2023  5:01 PM) Pt has not taken in over 30 days, pt's granddaughter and daughter state that is d/t noncompliance from the pt   ferrous sulfate  325 (65 FE) MG tablet [id number]  Take 1 tablet (325 mg total) by mouth daily with breakfast. Willette Adriana LABOR, MD  Active   fluticasone -salmeterol (ADVAIR) 100-50 MCG/ACT AEPB [id number]  Inhale 1 puff into the lungs 2 (two) times daily. Pearlean Manus, MD  Active Family Member, Pharmacy Records, Child           Med Note DRENA, CHUCK KANDICE Kitchens Dec 25, 2023  4:52 PM) 2 weeks ago  lidocaine -prilocaine  (EMLA ) cream [id number]  Apply 1 Application topically as needed. Apply a small amount to port a cath site and cover with plastic wrap 1 hour prior to infusion appointments Pappayliou, Dorothyann LABOR, DO  Active Family Member, Pharmacy Records, Child  lovastatin  (MEVACOR ) 20 MG tablet [id number]  Take 1 tablet (20 mg total) by mouth at bedtime. Willette Adriana LABOR, MD  Active Family Member, Pharmacy Records, Child           Med Note (WARD, CHUCK KANDICE Kitchens Dec 25, 2023  5:01 PM) Pt has not taken in over 30 days, pt's granddaughter and daughter state that is d/t noncompliance from the pt   metolazone  (ZAROXOLYN ) 2.5 MG tablet [id number]  Take 1 tablet (2.5 mg total) by mouth daily as needed. Rogers Hai, MD  Active Family Member, Pharmacy Records, Child  nystatin  (MYCOSTATIN /NYSTOP ) powder [id number]  Apply 1 Application topically 2 (two) times daily. Willette Adriana LABOR, MD  Active   oxyCODONE -acetaminophen  (PERCOCET) 10-325 MG tablet [id number]  Take 1 tablet by mouth 4 (four) times daily as needed for pain. [provider]  Active Family Member, Pharmacy Records, Child  OXYGEN  [id number]  Inhale 5 L into the lungs continuous.  Patient taking differently: Inhale 3 L into the lungs continuous.   [provider]  Active Family Member, Pharmacy Records, Child           Med Note  (WARD, ANGELICA G   Thu Jun 15, 2023  2:42 PM)    pantoprazole  (PROTONIX ) 40 MG tablet [id number]  Take 1 tablet (40 mg total) by mouth 2 (two) times daily. Willette Adriana LABOR, MD  Active   potassium chloride  SA (KLOR-CON  M) 20 MEQ tablet [id number]  Take 1 tablet (20 mEq total) by mouth 2 (two) times daily. Rogers Hai, MD  Expired 01/05/24 2359 Child           Med Note (WARD, ANGELICA G   Mon Dec 25, 2023  5:00 PM) Pt has not taken in over 30 days, pt's granddaughter and daughter state that is d/t noncompliance from the pt   Med List Note (Ward, Angelica, CPhT 12/25/23 1648): Revlimid  filled at [facility], pt's granddaughter Trystan handles pt's meds            Goals Addressed  This Visit's Progress    VBCI Transitions of Care (TOC) Care Plan       Problems:  Recent Hospitalization for treatment of acute hypoxic respiratory failure Equipment/DME barrier pt would like resources for assistance with walk in shower 01/11/24- pt reports she is weighing daily, states  I've lost a lot of fluid,  pt states she had telemedicine visit yesterday and instructed to take Bumex  and discontinue furosemide  due to this was causing stomach upset, pt denies stomach upset today, pt reports she is not checking CBG at home but will try to start.  Goal:  Over the next 30 days, the patient will not experience hospital readmission  Interventions:   Heart Failure Interventions: Advised patient to weigh each morning after emptying bladder Discussed importance of daily weight and advised patient to weigh and record daily Reviewed role of diuretics in prevention of fluid overload and management of heart failure; Discussed the importance of keeping all appointments with provider Reinforced HF action plan Care guide attempted to reach pt several times and unable to reach,  RN CM provided care guide contact # [phone]  Patient Self Care Activities:  Attend all  scheduled provider appointments Call pharmacy for medication refills 3-7 days in advance of running out of medications Call provider office for new concerns or questions  Notify RN Care Manager of TOC call rescheduling needs Participate in Transition of Care Program/Attend TOC scheduled calls Take medications as prescribed   call office if I gain more than 2 pounds in one day or 5 pounds in one week keep legs up while sitting watch for swelling in feet, ankles and legs every day weigh myself daily develop a rescue plan follow rescue plan if symptoms flare-up eat more whole grains, fruits and vegetables, lean meats and healthy fats dress right for the weather, hot or cold Please start checking your blood sugar  Plan:  Telephone follow up appointment with care management team member scheduled for:  01/19/24 @ 930 am The patient has been provided with contact information for the care management team and has been advised to call with any health related questions or concerns.          Recommendation:   PCP Follow-up  Follow Up Plan:   Telephone follow-up 01/19/24 @ 930 am  Mliss Creed [facility], BSN RN Care Manager/ Transition of Care [place]/ [facility] [phone]

## 2024-01-11 NOTE — Assessment & Plan Note (Addendum)
 Patient is seen today prior to her Darzalex  (monthly), iron  and B12 infusion. She was recently hospitalized for pneumonia/acute on chronic respiratory failure and heart failure.  She was given diuretics and steroids with improvement of her symptoms. Dr. Kent wanted someone to lay eyes on her prior to her injection today. Assessment is unremarkable.  Lungs sound clear.  She has completed her antibiotics. Overall, she is feeling much better. Labs from 10 7 5  show improvement of her hemoglobin to 9.3.  Platelets 99,000.  Differential unremarkable. CMP shows elevated blood glucose 240. Proceed with treatment. Follow-up per Dr. Armanda note.

## 2024-01-12 ENCOUNTER — Telehealth: Payer: Self-pay

## 2024-01-12 NOTE — Progress Notes (Signed)
   Telephone encounter was:  Successful.  Complex Care Management Note Care Guide Note  01/12/2024 Name: Savannah  LAURAN Burns MRN: [id number] DOB: Dec 14, 1950  Savannah  RANDYE Burns is a 73 y.o. year old female who is a primary care patient of Jolinda Norene HERO, DO . The community resource team was consulted for assistance with Home Modifications  SDOH screenings and interventions completed:  No        Care guide performed the following interventions: Patient provided with information about care guide support team and interviewed to confirm resource needs.Pt is requesting resources for a walk in shower for [facility] to be mailed to her   Follow Up Plan:  No further follow up planned at this time. The patient has been provided with needed resources.  Encounter Outcome:  Patient Visit Completed    [name] [facility]  [facility] Guide, Phone: [phone] Fax: [phone] Website: [email or web address]

## 2024-01-15 NOTE — Progress Notes (Unsigned)
 [facility] [address], [place] [address]   CLINIC:  Medical Oncology/Hematology  PCP:  Savannah Norene HERO, DO [address] [place] [address] [phone]   REASON FOR VISIT:  Follow-up for multiple myeloma and iron  deficiency anemia (chronic GI blood loss)  PRIOR THERAPY: None  CURRENT THERAPY: Daratumumab , lenalidomide  (HELD since 09/26/2023), dexamethasone   BRIEF ONCOLOGIC HISTORY:   Oncology History  Multiple myeloma (HCC)  11/30/2021 Initial Diagnosis   Multiple myeloma (HCC)   11/30/2021 Cancer Staging   Staging form: Plasma Cell Myeloma and Plasma Cell Disorders, AJCC 8th Edition - Clinical stage from 11/30/2021: RISS Stage II (Beta-2 -microglobulin (mg/L): 2.8, Albumin (g/dL): 3, ISS: Stage II, High-risk cytogenetics: Absent, LDH: Normal) - Signed by Rogers Hai, MD on 11/30/2021 Histopathologic type: Multiple myeloma Stage prefix: Initial diagnosis Beta 2 microglobulin range (mg/L): Less than 3.5 Albumin range (g/dL): Less than 3.5 Cytogenetics: t(11;14) translocation Serum calcium  level: Normal   12/14/2021 -  Chemotherapy   Patient is on Treatment Plan : MYELOMA  Daratumumab  SQ + Lenalidomide  + Dexamethasone  (DaraRd) q28d       CANCER STAGING:  Cancer Staging  Multiple myeloma (HCC) Staging form: Plasma Cell Myeloma and Plasma Cell Disorders, AJCC 8th Edition - Clinical stage from 11/30/2021: RISS Stage II (Beta-2 -microglobulin (mg/L): 2.8, Albumin (g/dL): 3, ISS: Stage II, High-risk cytogenetics: Absent, LDH: Normal) - Signed by Rogers Hai, MD on 11/30/2021   INTERVAL HISTORY:   Savannah Burns  Savannah Burns, a 73 y.o. female, returns for routine follow-up of her multiple myeloma.  Savannah Burns  was last seen on 01/02/2024 by NP Delon Hope.  Prior to that, she was seen by Dr. Davonna on 11/21/2023.  At today's visit, she reports feeling fair. She reports 50% energy and 100% appetite.  She is maintaining stable weight at this  time.  MULTIPLE MYELOMA: She denies any new bone pain or fractures. She denies any B symptoms such as fever, chills, night sweats, unintentional weight loss..   Denies any numbness or tingling in hands or feet. No new masses or lymphadenopathy per her report.   DARZALEX  SIDE EFFECTS: She was hospitalized from 12/24/2023 through 12/28/2023 due to COVID-19 pneumonia. She denies any other recent infections. She has baseline any abnormal fatigue. No nausea, diarrhea, constipation. She has some multifactorial dyspnea at baseline, but this has improved after increased diuresis.  CHRONIC GI BLEEDING: During hospitalization for COVID-19 (12/24/2023 for 12/28/2023), she required 2 units PRBC after Hgb dropped to 6.0 on 12/24/2023.  Inpatient EGD/colonoscopy was not advised due to patient's respiratory distress. She is receiving IV Venofer  500 mg given every 2 weeks. She reports that she had some recurrent melanotic stool back in September, but stool has returned to normal over the past few weeks.  No gross hematochezia. She has not been seen by gastroenterology since [facility] saw her in 2023, but she did have inpatient GI evaluation by NP Therisa Stager and Dr. Cindie, with plans for upcoming visit with Dr. Eartha and EGD/colonoscopy/push enteroscopy in the outpatient setting (pending cardiac clearance).    ASSESSMENT & PLAN:  1.  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  Normal creatinine and calcium .  Multifactorial anemia including blood loss and bone marrow infiltration. BMBX (11/19/2021): Sheets of plasma cells comprising 40% of the marrow.  Orderly maturation of erythroid and  myeloid series. Myeloma FISH panel: t(11;14) Cytogenetics: Failed to grow metaphases PET scan (11/18/2021): No evidence of active  myeloma or plasmacytoma.  Right upper lobe lung infection. Skull x-rays (10/29/2021) multiple discrete round lucent/lytic lesions in the skull - 12/14/2021: Started on VRD regimen with lenalidomide  15 mg 3 weeks on/1 week off and then decrease to 10 for thrombocytopenia.  Dexamethasone  20 mg weekly. - Patient had worsening thrombocytopenia with a history of GI bleeding.  Held Revlimid  on 09/26/2023 - Last myeloma labs (10/24/2023): SPEP with stable M spike 0.1. Kappa light chain 11.1/lambda 4.1, FLC ratio 2.71. - She continues to receive monthly daratumumab , last given 01/02/2024  - Overall she is tolerating daratumumab  well.  She did have COVID-19 pneumonia in September/October 2025 (hospitalized), but denies any other infections.  Shortness of breath is at baseline. - PLAN: Continue monthly daratumumab , next due 01/30/2024.  Per Dr. Davonna, she can proceed with next treatment if no new issues.  She will see Dr. Davonna for MD visit with her daratumumab  treatment on 02/27/2024.  We will recheck myeloma labs 2 weeks before her treatment on 02/27/2024. - Continue acyclovir  for prophylaxis. - Per Dr. Davonna, would restart Revlimid  once labs are more stable or if she has any worsening of her myeloma markers.    2.  Iron  deficiency anemia in the setting of chronic GI blood loss - Longstanding history of iron  deficiency anemia, requiring frequent IV iron  and intermittent blood transfusions - Extensive endoscopic workup has revealed esophageal varices, gastritis, and AVMs.  (Follows with gastroenterology at [facility]) - Last EGD, colonoscopy, and enteroscopy were done in May 2023 at [facility] - During hospitalization for COVID-19 (12/24/2023 for 12/28/2023), she required 2 units PRBC after Hgb dropped to 6.0 on 12/24/2023.  Inpatient EGD/colonoscopy was not advised due to patient's respiratory distress. - She is receiving IV Venofer  500 mg given every 2 weeks. - She reports that she had some recurrent melanotic stool back  in September, but stool has returned to normal over the past few weeks.  No gross hematochezia. - She has not been seen by gastroenterology since [facility] saw her in 2023, but she did have inpatient GI evaluation by NP Therisa Stager and Dr. Cindie, with plans for upcoming visit with Dr. Eartha and EGD/colonoscopy/push enteroscopy in the outpatient setting (pending cardiac clearance).   - She is currently receiving IV Venofer  300 mg every 2 weeks, last given on 01/02/2024 - Labs today (01/16/2024) Hgb 8.2/MCV 97.9.  Ferritin 155, iron  saturation 11%. - PLAN: Proceed with Venofer  500 mg x 1 dose given today.   - We will switch to monthly Venofer  500 mg infusion (staggered to be given 2 weeks after myeloma treatment) to avoid iron  overload in patient with liver cirrhosis.   - Continue close monitoring with CBC/BB sample every 2 weeks.   - Continue close GI follow-up as scheduled   3.  Vitamin B12 + folate deficiency - Receiving monthly B12 injections, last given on 01/02/2024 - Labs today (03/17/2024): Normal vitamin B12 at 969.  Folate is low at <3.0. - PLAN: Continue monthly B12 injections - Rx to pharmacy to start taking folate 1 mg daily  4.  Chronic thrombocytopenia and leukopenia, secondary to cirrhosis - Suspect thrombocytopenia and leukopenia secondary to underlying cirrhosis  PLAN SUMMARY: >> 01/30/2024 = Labs (CBC/D, BB sample, CMP, magnesium ) + TREATMENT (daratumumab ) + B12 injection >> 02/13/2024 = Labs (CBC/D, BB sample, ferritin, iron /TIBC, kappa/lambda light chains, multiple myeloma panel) + Venofer  500 mg >> 02/27/2024 =  Labs (CBC/D, BB sample, CMP, magnesium ) + TREATMENT (daratumumab ) + B12 injection + MD VISIT (Dr. Davonna)    REVIEW OF SYSTEMS:   Review of Systems  Constitutional:  Positive for fatigue. Negative for appetite change, chills, diaphoresis, fever and unexpected weight change.  HENT:   Negative for lump/mass and nosebleeds.   Eyes:  Negative for eye problems.   Respiratory:  Positive for shortness of breath. Negative for cough and hemoptysis.   Cardiovascular:  Negative for chest pain, leg swelling and palpitations.  Gastrointestinal:  Positive for blood in stool (intermittent melena). Negative for abdominal pain, constipation, diarrhea, nausea and vomiting.  Genitourinary:  Negative for hematuria.   Skin: Negative.   Neurological:  Negative for dizziness, headaches and light-headedness.  Hematological:  Does not bruise/bleed easily.    PHYSICAL EXAM:   Performance status (ECOG): 2 - Symptomatic, <50% confined to bed  There were no vitals filed for this visit. Wt Readings from Last 3 Encounters:  01/16/24 286 lb 6 oz (129.9 kg)  01/11/24 280 lb (127 kg)  01/05/24 280 lb 3.2 oz (127.1 kg)   Physical Exam Constitutional:      Appearance: Normal appearance. She is morbidly obese.  Cardiovascular:     Rate and Rhythm: Tachycardia present.     Heart sounds: Normal heart sounds.  Pulmonary:     Effort: Tachypnea present.     Breath sounds: Normal breath sounds.  Neurological:     General: No focal deficit present.     Mental Status: Mental status is at baseline.  Psychiatric:        Behavior: Behavior normal. Behavior is cooperative.      PAST MEDICAL/SURGICAL HISTORY:  Past Medical History:  Diagnosis Date   Adrenal adenoma, left    Stable   Anxiety    Arthritis    bilateral hands   COPD (chronic obstructive pulmonary disease) (HCC)    Depression    Diabetes mellitus, type 2 (HCC) 08/12/2008   Qualifier: Diagnosis of  By: Jenetta MD, Talia     Dyspnea    Esophageal varices (HCC)    Grade II diastolic dysfunction    History of kidney stones    Hyperlipidemia    Hypertension    Lower back pain    Lower GI bleed 03/19/2020   Panic attacks    Pneumonia    currently taking antibiotic and prednisone  for early stages of pneumonia   Pulmonary nodules    bilateral   Skin cancer    face   Past Surgical History:  Procedure  Laterality Date   BIOPSY  04/07/2020   Procedure: BIOPSY;  Surgeon: Cindie Carlin POUR, DO;  Location: [facility];  Service: Endoscopy;;   Breast Cystectomy  Right    CESAREAN SECTION     COLONOSCOPY WITH PROPOFOL  N/A 01/25/2020   Dr. Cindie: Nonbleeding internal hemorrhoids, diverticulosis, 5 mm polyp removed from the ascending colon, 10 mm polyp removed from the sigmoid colon, 30 mm polyp (tubulovillous adenoma with no high-grade dysplasia) removed from the transverse colon via piecemeal status post tattoo.  Other polyps were tubular adenomas.  3 month surveillance colonoscopy recommended.   COLONOSCOPY WITH PROPOFOL  N/A 04/07/2020   Procedure: COLONOSCOPY WITH PROPOFOL ;  Surgeon: Cindie Carlin POUR, DO;  Location: [facility];  Service: Endoscopy;  Laterality: N/A;  3:00pm, pt knows new time per office   CYSTOSCOPY/URETEROSCOPY/HOLMIUM LASER/STENT PLACEMENT Bilateral 03/01/2019   Procedure: CYSTOSCOPY/RETROGRADEURETEROSCOPY/HOLMIUM LASER/STENT PLACEMENT;  Surgeon: Devere Lonni Righter, MD;  Location: [facility];  Service: Urology;  Laterality: Bilateral;  ONLY NEEDS 60 MIN   ESOPHAGOGASTRODUODENOSCOPY (EGD) WITH PROPOFOL  N/A 01/25/2020   Dr. Cindie: 4 columns grade 1 esophageal varices   ESOPHAGOGASTRODUODENOSCOPY (EGD) WITH PROPOFOL  N/A 05/18/2020   Procedure: ESOPHAGOGASTRODUODENOSCOPY (EGD) WITH PROPOFOL ;  Surgeon: Cindie Carlin POUR, DO;  [facility]: [facility];  Service: Endoscopy;  Laterality: N/A;   ESOPHAGOGASTRODUODENOSCOPY (EGD) WITH PROPOFOL  N/A 07/28/2020   Procedure: ESOPHAGOGASTRODUODENOSCOPY (EGD) WITH PROPOFOL ;  Surgeon: Cindie Carlin POUR, DO;  [facility]: [facility];  Service: Endoscopy;  Laterality: N/A;  am or early PM due to givens capsule placement   GIVENS CAPSULE STUDY N/A 05/18/2020   Procedure: GIVENS CAPSULE STUDY;  Surgeon: Eartha Angelia Sieving, MD;  Location: [facility];  Service: Gastroenterology;  Laterality: N/A;   GIVENS CAPSULE STUDY N/A 07/28/2020    Procedure: GIVENS CAPSULE STUDY;  Surgeon: Cindie Carlin POUR, DO;  [facility]: [facility];  Service: Endoscopy;  Laterality: N/A;   IR IMAGING GUIDED PORT INSERTION  12/02/2021   IRRIGATION AND DEBRIDEMENT ABSCESS Right 09/28/2022   Procedure: IRRIGATION AND DEBRIDEMENT ABSCESS;  Surgeon: Evonnie Dorothyann LABOR, DO;  Location: [facility];  Service: General;  Laterality: Right;   POLYPECTOMY  01/25/2020   Procedure: POLYPECTOMY;  Surgeon: Cindie Carlin POUR, DO;  [facility];  Service: Endoscopy;;   POLYPECTOMY  04/07/2020   Procedure: POLYPECTOMY INTESTINAL;  Surgeon: Cindie Carlin POUR, DO;  [facility];  Service: Endoscopy;;   PORT-A-CATH REMOVAL Right 09/28/2022   Procedure: MINOR REMOVAL PORT-A-CATH;  Surgeon: Evonnie Dorothyann LABOR, DO;  Location: [facility];  Service: General;  Laterality: Right;   PORTACATH PLACEMENT Right 04/12/2023   Procedure: INSERTION PORT-A-CATH, RIJ;  Surgeon: Evonnie Dorothyann LABOR, DO;  Location: [facility];  Service: General;  Laterality: Right;   SKIN CANCER EXCISION     Face   SPINE SURGERY     SUBMUCOSAL TATTOO INJECTION  01/25/2020   Procedure: SUBMUCOSAL TATTOO INJECTION;  Surgeon: Cindie Carlin POUR, DO;  [facility]: [facility];  Service: Endoscopy;;    SOCIAL HISTORY:  Social History   Socioeconomic History   Marital status: Widowed    Spouse name: Not on file   Number of children: 2   Years of education: 14   Highest education level: Not on file  Occupational History   Occupation: Retired   Tobacco Use   Smoking status: Former    Current packs/day: 0.00    Average packs/day: 1.5 packs/day for 40.0 years (60.0 ttl pk-yrs)    Types: Cigarettes    Start date: 04/29/1975    Quit date: 04/29/2015    Years since quitting: 8.7   Smokeless tobacco: Never   Tobacco comments:    Quit smoking 04/2015- Previous 1.5 ppd smoker  Vaping Use   Vaping status: Never Used  Substance and Sexual Activity   Alcohol  use: No    Alcohol /week: 0.0  standard drinks of alcohol    Drug use: No   Sexual activity: Not Currently    Birth control/protection: Post-menopausal  Other Topics Concern   Not on file  Social History Narrative   Her 51 year old granddaughter lives with her - one daughter lives nearby, but she doesn't have a good relationship with her. Has a great relationship with other daughter who lives 1.5 hrs away - talks to her daily on the phone.   Social Drivers of Health   Financial Resource Strain: Low Risk  (06/12/2023)   Overall Financial Resource Strain (CARDIA)  Difficulty of Paying Living Expenses: Not very hard  Food Insecurity: No Food Insecurity (01/02/2024)   Hunger Vital Sign    Worried About Running Out of Food in the Last Year: Never true    Ran Out of Food in the Last Year: Never true  Transportation Needs: No Transportation Needs (01/02/2024)   PRAPARE - Administrator, Civil Service (Medical): No    Lack of Transportation (Non-Medical): No  Physical Activity: Inactive (01/27/2023)   Exercise Vital Sign    Days of Exercise per Week: 0 days    Minutes of Exercise per Session: 0 min  Stress: No Stress Concern Present (01/27/2023)   [employer] of Occupational Health - Occupational Stress Questionnaire    Feeling of Stress : Not at all  Social Connections: Socially Isolated (12/24/2023)   Social Connection and Isolation Panel    Frequency of Communication with Friends and Family: More than three times a week    Frequency of Social Gatherings with Friends and Family: More than three times a week    Attends Religious Services: Never    Database administrator or Organizations: No    Attends Banker Meetings: Never    Marital Status: Widowed  Intimate Partner Violence: Not At Risk (01/02/2024)   Humiliation, Afraid, Rape, and Kick questionnaire    Fear of Current or Ex-Partner: No    Emotionally Abused: No    Physically Abused: No    Sexually Abused: No    FAMILY HISTORY:   Family History  Problem Relation Age of Onset   Diabetes Father    Heart disease Father 41       MI   Hypertension Father    Anemia Mother        Transfusion dependent   COPD Sister    Cancer Paternal Grandmother 73       Pancreatic    CURRENT MEDICATIONS:  Current Outpatient Medications  Medication Sig Dispense Refill   acyclovir  (ZOVIRAX ) 400 MG tablet Take 1 tablet (400 mg total) by mouth 2 (two) times daily. 60 tablet 4   albuterol  (PROVENTIL ) (2.5 MG/3ML) 0.083% nebulizer solution Take 3 mLs (2.5 mg total) by nebulization every 4 (four) hours as needed for wheezing or shortness of breath. 75 mL 1   albuterol  (VENTOLIN  HFA) 108 (90 Base) MCG/ACT inhaler Inhale 2 puffs into the lungs every 6 (six) hours as needed for wheezing or shortness of breath. 8 g 3   bumetanide  (BUMEX ) 1 MG tablet Take 2 tablets (2 mg total) by mouth daily. 60 tablet 2   desvenlafaxine  (PRISTIQ ) 100 MG 24 hr tablet Take 100 mg by mouth in the morning.     ferrous sulfate  325 (65 FE) MG tablet Take 1 tablet (325 mg total) by mouth daily with breakfast. 30 tablet 3   fluticasone -salmeterol (ADVAIR) 100-50 MCG/ACT AEPB Inhale 1 puff into the lungs 2 (two) times daily. 60 each 5   lidocaine -prilocaine  (EMLA ) cream Apply 1 Application topically as needed. Apply a small amount to port a cath site and cover with plastic wrap 1 hour prior to infusion appointments 30 g 3   lovastatin  (MEVACOR ) 20 MG tablet Take 1 tablet (20 mg total) by mouth at bedtime. 90 tablet 3   metolazone  (ZAROXOLYN ) 2.5 MG tablet Take 1 tablet (2.5 mg total) by mouth daily as needed. 30 tablet 0   nystatin  (MYCOSTATIN /NYSTOP ) powder Apply 1 Application topically 2 (two) times daily. 30 g 1   oxyCODONE -acetaminophen  (PERCOCET) 10-325  MG tablet Take 1 tablet by mouth 4 (four) times daily as needed for pain.     OXYGEN  Inhale 5 L into the lungs continuous. (Patient taking differently: Inhale 3 L into the lungs continuous.)     pantoprazole   (PROTONIX ) 40 MG tablet Take 1 tablet (40 mg total) by mouth 2 (two) times daily. 60 tablet 0   potassium chloride  SA (KLOR-CON  M) 20 MEQ tablet Take 1 tablet (20 mEq total) by mouth 2 (two) times daily. 60 tablet 3   No current facility-administered medications for this visit.   Facility-Administered Medications Ordered in Other Visits  Medication Dose Route Frequency Provider Last Rate Last Admin   iron  sucrose (VENOFER ) 500 mg in sodium chloride  0.9 % 250 mL IVPB  500 mg Intravenous Once Kandala, Hyndavi, MD 110 mL/hr at 01/16/24 0956 500 mg at 01/16/24 9043    ALLERGIES:  Allergies  Allergen Reactions   Keflex  [Cephalexin ] Nausea And Vomiting    LABORATORY DATA:  I have reviewed the labs as listed.     Latest Ref Rng & Units 01/16/2024    8:20 AM 01/05/2024    3:11 PM 01/02/2024   10:35 AM  CBC  WBC 4.0 - 10.5 K/uL 4.5  5.6  4.3   Hemoglobin 12.0 - 15.0 g/dL 8.2  9.9  9.3   Hematocrit 36.0 - 46.0 % 27.5  34.0  32.0   Platelets 150 - 400 K/uL 82  108  99       Latest Ref Rng & Units 01/16/2024    8:20 AM 01/05/2024    3:11 PM 01/02/2024   10:35 AM  CMP  Glucose 70 - 99 mg/dL 879  94  759   BUN 8 - 23 mg/dL 10  20  13    Creatinine 0.44 - 1.00 mg/dL 9.54  9.37  9.37   Sodium 135 - 145 mmol/L 140  143  140   Potassium 3.5 - 5.1 mmol/L 3.7  3.6  3.6   Chloride 98 - 111 mmol/L 104  102  101   CO2 22 - 32 mmol/L 29  29  30    Calcium  8.9 - 10.3 mg/dL 8.1  8.9  8.5   Total Protein 6.5 - 8.1 g/dL 5.4  5.6  5.2   Total Bilirubin 0.0 - 1.2 mg/dL 0.4  0.5  0.5   Alkaline Phos 38 - 126 U/L 122  129  112   AST 15 - 41 U/L 15  16  17    ALT 0 - 44 U/L 13  20  31      DIAGNOSTIC IMAGING:  I have independently reviewed the scans and discussed with the patient. ECHOCARDIOGRAM COMPLETE Result Date: 12/26/2023    ECHOCARDIOGRAM REPORT   Patient Name:   Zareena  BRISSA ASANTE Date of Exam: 12/26/2023 Medical Rec #:  [id number]          Height:       62.0 in Accession #:    [id number]          Weight:       283.1 lb Date of Birth:  March 16, 1951          BSA:          2.216 m Patient Age:    73 years           BP:           131/77 mmHg Patient Gender: F  HR:           75 bpm. Exam Location:  [facility] Procedure: 2D Echo, Cardiac Doppler and Color Doppler (Both Spectral and Color            Flow Doppler were utilized during procedure). Indications:     Aortic stenosis  History:         Patient has prior history of Echocardiogram examinations, most                  recent 09/27/2022. COPD; Risk Factors:Hypertension, Diabetes and                  Dyslipidemia.  Sonographer:     Meagan Baucom RDCS, FE, PE Referring Phys:  [phone] ANNA W BOONE Diagnosing Phys: Vishnu Priya Mallipeddi IMPRESSIONS  1. Left ventricular ejection fraction, by estimation, is 60 to 65%. The left ventricle has normal function. The left ventricle has no regional wall motion abnormalities. There is mild left ventricular hypertrophy. Left ventricular diastolic parameters are consistent with Grade I diastolic dysfunction (impaired relaxation). Elevated left ventricular end-diastolic pressure.  2. Right ventricular systolic function is normal. The right ventricular size is normal. There is normal pulmonary artery systolic pressure.  3. Left atrial size was mild to moderately dilated.  4. Right atrial size was moderately dilated.  5. The mitral valve is abnormal. No evidence of mitral valve regurgitation. No evidence of mitral stenosis. Moderate to severe mitral annular calcification.  6. The aortic valve is tricuspid. There is moderate calcification of the aortic valve. There is moderate thickening of the aortic valve. Aortic valve regurgitation is not visualized. Mild to moderate aortic stenosis is present. Aortic valve area, by VTI  measures 2.63 cm likely due to overestimated LVOT diameter but aortic valve area by planimetry was 1.89 cm2 although this image was suboptimal. Based on restricted aortic valve leaflet excursion,  aortic valve stenosis is mild to moderate. Aortic valve mean gradient measures 24.7 mmHg. Aortic valve Vmax measures 3.35 m/s.  7. Aortic dilatation noted. There is mild dilatation of the ascending aorta, measuring 41 mm.  8. The inferior vena cava is dilated in size with >50% respiratory variability, suggesting right atrial pressure of 8 mmHg. FINDINGS  Left Ventricle: Left ventricular ejection fraction, by estimation, is 60 to 65%. The left ventricle has normal function. The left ventricle has no regional wall motion abnormalities. Strain was performed and the global longitudinal strain is indeterminate. The left ventricular internal cavity size was normal in size. There is mild left ventricular hypertrophy. Left ventricular diastolic function could not be evaluated due to mitral annular calcification (moderate or greater). Left ventricular diastolic parameters are consistent with Grade I diastolic dysfunction (impaired relaxation). Elevated left ventricular end-diastolic pressure. Right Ventricle: The right ventricular size is normal. No increase in right ventricular wall thickness. Right ventricular systolic function is normal. There is normal pulmonary artery systolic pressure. The tricuspid regurgitant velocity is 2.41 m/s, and  with an assumed right atrial pressure of 8 mmHg, the estimated right ventricular systolic pressure is 31.2 mmHg. Left Atrium: Left atrial size was mild to moderately dilated. Right Atrium: Right atrial size was moderately dilated. Pericardium: There is no evidence of pericardial effusion. Mitral Valve: The mitral valve is abnormal. Moderate to severe mitral annular calcification. No evidence of mitral valve regurgitation. No evidence of mitral valve stenosis. Tricuspid Valve: The tricuspid valve is normal in structure. Tricuspid valve regurgitation is trivial. No evidence of tricuspid stenosis. Aortic Valve: The aortic  valve is tricuspid. There is moderate calcification of the aortic  valve. There is moderate thickening of the aortic valve. Aortic valve regurgitation is not visualized. Mild to moderate aortic stenosis is present. Aortic valve mean gradient measures 24.7 mmHg. Aortic valve peak gradient measures 44.9 mmHg. Aortic valve area, by VTI measures 2.63 cm. Pulmonic Valve: The pulmonic valve was not well visualized. Pulmonic valve regurgitation is not visualized. No evidence of pulmonic stenosis. Aorta: The aortic root is normal in size and structure and aortic dilatation noted. There is mild dilatation of the ascending aorta, measuring 41 mm. Venous: The inferior vena cava is dilated in size with greater than 50% respiratory variability, suggesting right atrial pressure of 8 mmHg. IAS/Shunts: No atrial level shunt detected by color flow Doppler. Additional Comments: 3D was performed not requiring image post processing on an independent workstation and was indeterminate.  LEFT VENTRICLE PLAX 2D LVIDd:         4.80 cm   Diastology LVIDs:         3.00 cm   LV e' medial:    5.44 cm/s LV PW:         1.30 cm   LV E/e' medial:  21.0 LV IVS:        1.20 cm   LV e' lateral:   8.05 cm/s LVOT diam:     2.65 cm   LV E/e' lateral: 14.2 LV SV:         158 LV SV Index:   71 LVOT Area:     5.52 cm  RIGHT VENTRICLE RV S prime:     10.80 cm/s TAPSE (M-mode): 1.9 cm LEFT ATRIUM             Index        RIGHT ATRIUM           Index LA Vol (A2C):   75.1 ml 33.89 ml/m  RA Area:     26.10 cm LA Vol (A4C):   90.2 ml 40.71 ml/m  RA Volume:   80.30 ml  36.24 ml/m LA Biplane Vol: 86.6 ml 39.08 ml/m  AORTIC VALVE AV Area (Vmax):    2.26 cm AV Area (Vmean):   2.29 cm AV Area (VTI):     2.63 cm AV Vmax:           335.00 cm/s AV Vmean:          231.333 cm/s AV VTI:            0.600 m AV Peak Grad:      44.9 mmHg AV Mean Grad:      24.7 mmHg LVOT Vmax:         137.00 cm/s LVOT Vmean:        96.100 cm/s LVOT VTI:          0.286 m LVOT/AV VTI ratio: 0.48  AORTA Ao Root diam: 3.30 cm Ao Asc diam:  4.10 cm MITRAL  VALVE                TRICUSPID VALVE MV Area (PHT): 3.74 cm     TR Peak grad:   23.2 mmHg MV Decel Time: 203 msec     TR Vmax:        241.00 cm/s MV E velocity: 114.00 cm/s MV A velocity: 131.00 cm/s  SHUNTS MV E/A ratio:  0.87         Systemic VTI:  0.29 m  Systemic Diam: 2.65 cm Vishnu Priya Mallipeddi Electronically signed by Diannah Late Mallipeddi Signature Date/Time: 12/26/2023/12:04:37 PM    Final (Updated)    DG Chest Portable 1 View Result Date: 12/24/2023 CLINICAL DATA:  Cough and fever. EXAM: PORTABLE CHEST 1 VIEW COMPARISON:  November 13, 2023 FINDINGS: There is stable right-sided venous Port-A-Cath positioning. The cardiac silhouette is enlarged and unchanged in size. There is marked severity calcification of the aortic arch. Prominence of the central pulmonary vasculature is seen. Mild, diffusely increased interstitial lung markings are noted. No pleural effusion or pneumothorax is identified. Multilevel degenerative changes are seen throughout the thoracic spine. IMPRESSION: Cardiomegaly with mild pulmonary vascular congestion. Electronically Signed   By: Suzen Dials M.D.   On: 12/24/2023 17:14     WRAP UP:  All questions were answered. The patient knows to call the clinic with any problems, questions or concerns.  Medical decision making: Moderate  Time spent on visit: I spent 20 minutes counseling the patient face to face. The total time spent in the appointment was 30 minutes and more than 50% was on counseling.  Pleasant CHRISTELLA Barefoot, PA-C  01/16/24 12:09 PM

## 2024-01-16 ENCOUNTER — Inpatient Hospital Stay

## 2024-01-16 ENCOUNTER — Inpatient Hospital Stay (HOSPITAL_BASED_OUTPATIENT_CLINIC_OR_DEPARTMENT_OTHER): Admitting: Physician Assistant

## 2024-01-16 VITALS — BP 126/59 | HR 80 | Temp 97.2°F | Resp 20

## 2024-01-16 DIAGNOSIS — D500 Iron deficiency anemia secondary to blood loss (chronic): Secondary | ICD-10-CM

## 2024-01-16 DIAGNOSIS — C9000 Multiple myeloma not having achieved remission: Secondary | ICD-10-CM | POA: Diagnosis not present

## 2024-01-16 DIAGNOSIS — E538 Deficiency of other specified B group vitamins: Secondary | ICD-10-CM | POA: Diagnosis not present

## 2024-01-16 DIAGNOSIS — Z5112 Encounter for antineoplastic immunotherapy: Secondary | ICD-10-CM | POA: Diagnosis not present

## 2024-01-16 LAB — CBC WITH DIFFERENTIAL/PLATELET
Abs Immature Granulocytes: 0.02 K/uL (ref 0.00–0.07)
Basophils Absolute: 0 K/uL (ref 0.0–0.1)
Basophils Relative: 0 %
Eosinophils Absolute: 0 K/uL (ref 0.0–0.5)
Eosinophils Relative: 1 %
HCT: 27.5 % — ABNORMAL LOW (ref 36.0–46.0)
Hemoglobin: 8.2 g/dL — ABNORMAL LOW (ref 12.0–15.0)
Immature Granulocytes: 0 %
Lymphocytes Relative: 26 %
Lymphs Abs: 1.2 K/uL (ref 0.7–4.0)
MCH: 29.2 pg (ref 26.0–34.0)
MCHC: 29.8 g/dL — ABNORMAL LOW (ref 30.0–36.0)
MCV: 97.9 fL (ref 80.0–100.0)
Monocytes Absolute: 0.5 K/uL (ref 0.1–1.0)
Monocytes Relative: 10 %
Neutro Abs: 2.8 K/uL (ref 1.7–7.7)
Neutrophils Relative %: 63 %
Platelets: 82 K/uL — ABNORMAL LOW (ref 150–400)
RBC: 2.81 MIL/uL — ABNORMAL LOW (ref 3.87–5.11)
RDW: 18.6 % — ABNORMAL HIGH (ref 11.5–15.5)
WBC: 4.5 K/uL (ref 4.0–10.5)
nRBC: 0 % (ref 0.0–0.2)

## 2024-01-16 LAB — FOLATE: Folate: <3 ng/mL — ABNORMAL LOW (ref >5.9)

## 2024-01-16 LAB — COMPREHENSIVE METABOLIC PANEL WITH GFR
ALT: 13 U/L (ref 0–44)
AST: 15 U/L (ref 15–41)
Albumin: 3.6 g/dL (ref 3.5–5.0)
Alkaline Phosphatase: 122 U/L (ref 38–126)
Anion gap: 7 (ref 5–15)
BUN: 10 mg/dL (ref 8–23)
CO2: 29 mmol/L (ref 22–32)
Calcium: 8.1 mg/dL — ABNORMAL LOW (ref 8.9–10.3)
Chloride: 104 mmol/L (ref 98–111)
Creatinine, Ser: 0.45 mg/dL (ref 0.44–1.00)
GFR, Estimated: >60 mL/min (ref >60)
Glucose, Bld: 120 mg/dL — ABNORMAL HIGH (ref 70–99)
Potassium: 3.7 mmol/L (ref 3.5–5.1)
Sodium: 140 mmol/L (ref 135–145)
Total Bilirubin: 0.4 mg/dL (ref 0.0–1.2)
Total Protein: 5.4 g/dL — ABNORMAL LOW (ref 6.5–8.1)

## 2024-01-16 LAB — IRON AND TIBC
Iron: 38 ug/dL (ref 28–170)
Saturation Ratios: 11 % (ref 10.4–31.8)
TIBC: 335 ug/dL (ref 250–450)
UIBC: 296 ug/dL

## 2024-01-16 LAB — SAMPLE TO BLOOD BANK

## 2024-01-16 LAB — FERRITIN: Ferritin: 155 ng/mL (ref 11–307)

## 2024-01-16 LAB — VITAMIN B12: Vitamin B-12: 969 pg/mL — ABNORMAL HIGH (ref 180–914)

## 2024-01-16 LAB — MAGNESIUM: Magnesium: 2.1 mg/dL (ref 1.7–2.4)

## 2024-01-16 MED ORDER — SODIUM CHLORIDE 0.9 % IV SOLN: Freq: Once | INTRAVENOUS | Status: AC

## 2024-01-16 MED ORDER — SODIUM CHLORIDE 0.9 % IV SOLN
500.0000 mg | Freq: Once | INTRAVENOUS | Status: AC
  Administered 2024-01-16: 500 mg via INTRAVENOUS
  Filled 2024-01-16: qty 20

## 2024-01-16 MED ORDER — CETIRIZINE HCL 10 MG PO TABS
10.0000 mg | ORAL_TABLET | Freq: Once | ORAL | Status: AC
  Administered 2024-01-16: 10 mg via ORAL
  Filled 2024-01-16: qty 1

## 2024-01-16 MED ORDER — FOLIC ACID 1 MG PO TABS: 1.0000 mg | ORAL_TABLET | Freq: Every day | ORAL | 3 refills | Status: AC

## 2024-01-16 NOTE — Patient Instructions (Signed)
 [facility] at [facility] **VISIT SUMMARY & IMPORTANT INSTRUCTIONS **   You were seen today by Pleasant Barefoot PA-C for your multiple myeloma and iron  deficiency anemia.    PLAN SUMMARY: 01/30/2024: Labs & myeloma treatment (daratumumab ) 02/13/2024: Labs & IV iron  02/27/2024: Labs & myeloma treatment & MD visit with Dr. Davonna     MULTIPLE MYELOMA Continue daratumumab  (Darzalex ) given in cancer center every 4 weeks (next due on 01/30/2024)  IRON  DEFICIENCY ANEMIA You will receive iron  infusion every 4 weeks. We will continue to check your blood every 2 weeks to make sure you do not drop low enough that you need a blood transfusion. Follow-up with gastroenterologist (Dr. Eartha) as scheduled for ongoing treatment of GI bleeding.  MEDICATIONS: - Continue taking iron  tablet every other day. - START taking folate (folic acid ) 1 mg daily  FOLLOW-UP APPOINTMENT: MD visit with Dr. Davonna in 6 weeks  ** Thank you for trusting me with your healthcare!  I strive to provide all of my patients with quality care at each visit.  If you receive a survey for this visit, I would be so grateful to you for taking the time to provide feedback.  Thank you in advance!  ~ Eriko Economos                                        Dr. Mickiel Davonna Pleasant Barefoot, PA-C          Delon Hope, NP   - - - - - - - - - - - - - - - - - -    Thank you for choosing [facility] at [facility] to provide your oncology and hematology care.  To afford each patient quality time with our provider, please arrive at least 15 minutes before your scheduled appointment time.   If you have a lab appointment with the Cancer Center please come in thru the Main Entrance and check in at the main information desk.  You need to re-schedule your appointment should you arrive 10 or more minutes late.  We strive to give you quality time with our providers, and arriving late  affects you and other patients whose appointments are after yours.  Also, if you no show three or more times for appointments you may be dismissed from the clinic at the providers discretion.     Again, thank you for choosing [facility].  Our hope is that these requests will decrease the amount of time that you wait before being seen by our physicians.       _____________________________________________________________  Should you have questions after your visit to [facility], please contact our office at [phone] and follow the prompts.  Our office hours are 8:00 a.m. and 4:30 p.m. Monday - Friday.  Please note that voicemails left after 4:00 p.m. may not be returned until the following business day.  We are closed weekends and major holidays.  You do have access to a nurse 24-7, just call the main number to the clinic [phone] and do not press any options, hold on the line and a nurse will answer the phone.    For prescription refill requests, have your pharmacy contact our office and allow 72 hours.

## 2024-01-16 NOTE — Progress Notes (Signed)
 Patient tolerated iron infusion with no complaints voiced.  Port IV site clean and dry with good blood return noted before and after infusion.  Band aid applied.  VSS with discharge and left in satisfactory condition with no s/s of distress noted.

## 2024-01-16 NOTE — Progress Notes (Deleted)
 Cardiology Office Note:  .   Date:  01/16/2024  ID:  Savannah  G Burns, DOB 09-08-1950, MRN [id number] PCP: Jolinda Norene HERO, DO  [facility] Providers Cardiologist:  Alvan Carrier, MD {  History of Present Illness: .   Savannah Burns is a 73 y.o. 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   Last seen in heartcare OV 11/28/2023 by me for follow up and cardiac preop.  Reported Reported worsening SOB X 1 year, lower extremity edema X 4 years, and orthopnea for several years, however euvolemic on exam.  No med changes.  Patient was cleared from cardiac standpoint for colonoscopy.  Ordered ECHO, which was completed during hospital visit.   Recent hospitalization 9/28-10/04/2023 admitted for acute on chronic respiratory failure with hypoxia. Echo 9/30 showed EF 60 to 65%, mild LVH, G1DD, elevated LVEDP, mild to moderate LA dilated, moderately dilated RA, moderate to severe mitral annular calcifications, mild to moderate AS, mild dilation of ascending aorta at 41 mm, dilated IVC. Treated with steroids, IV diuretics, 2U PRBC blood transfusion, IV octreotide , IV Rocephin .  Suspect secondary to decompensated CHF (BNP 55), mild COPD exacerbation, worsening anemia and also COVID infection.  Deferred inpatient EGD or colonoscopy due to patient's acute respiratory distress.  Continued on Bumex  2 mg daily, Lasix  40 mg daily, lovastatin  20 mg daily, metolazone  2.5 mg as needed, KCl 20 mEq BID.   Today, reports ### and denies ###.  Reports compliance with  medications.  Dietary habitats:  Activity level:  Social: Denies tobacco use/alcohol /drug use  Denies any recent hospitalizations or visits to the emergency department.   Reports compliance with medications.  Endorses low salt diet.  Drinks approximately 46 ounces of water  daily.  She is not very active due to O2 requirements.  Patient is able to bathe herself but requires assistance getting into the tub.  She is able to walk indoors but limited by SOB and O2 tank.  She is able to wash dishes after her daughter sets up O2 tank. Previously smoked but quit in 04/2015. Denies tobacco use/alcohol /drug use ????  Chronic heart failure with preserved ejection fraction (HCC) EF preserved since 2016 >60% with most recent EF 11/2023 60 to 65%. ECHO 06/03/2022 which showed EF 75%, mild LVH, G1DD, moderately dilated LA/RA, dilated IVC.  Repeat echo on 09/27/2022 due to sepsis showed EF 65 to 70%, mild LVH, G1DD, elevated LA pressure, mildly elevated PASP, mildly dilated LA/RA, mildly dilated ascending aorta measuring 40 mm, dilated IVC.  Echo 9/30 showed EF 60 to 65%, mild LVH, G1DD, elevated LVEDP, mild to moderate LA dilated, moderately dilated RA, dilated IVC  Reports worsening SOB X 1 year, lower extremity edema X 4 years, and orthopnea for several years Appears Euvolemic on exam.  Weight today is .SABRA Lbs (Last OV 11/28/2023 293 lbs, Hospital 11/2023-12/2023 288 > 285 lbs, 01/05/2024 280 lbs)   K & Cr WNL 01/16/2024 Continue on Carvedilol  6.25 mg twice daily and Lasix  40 mg daily.  Patient listed as being on Lasix  and Bumex . She states she is taking Lasix  as above. Will verify most recent  rx with pharmacy.  Encouraged low sodium diet, fluid restriction <2L, and daily weights.  Educated to contact our office for weight gain of 2 lbs overnight or 5 lbs in one week. ED precautions discussed.     Hypertension associated with diabetes (HCC) BP this OV well controlled today:  Managed by GDMT as above.  Encourage  physical activity for 150 minutes per week and heart healthy low sodium diet. Discussed limiting sodium intake to < 2 grams daily.      Hyperlipidemia associated with type 2 diabetes mellitus (HCC) LDL 96 in 01/2023 & LFT WNL in 01/16/2024 Continue on Lovastatin  20 mg daily   Moderate aortic stenosis by prior echocardiogram Echo on 09/27/2022: mild MV regurgitation, moderate AV stenosis Echo on 11/2023: moderate to severe mitral annular calcifications, mild to moderate AS, mild dilation of ascending aorta at 41 mm 2/6 systolic murmur in LUSB Continue to monitor. Will plan to repeat in 11/2024.   Iron  deficiency anemia due to chronic blood loss She continues to have iron  infusions and blood transfusions every other week. This continues to be followed by hematology and GI.    Multiple myeloma not having achieved remission [facility]) Currently undergoing chemotherapy initial diagnosis was made in 11/30/2021  Followed by heme-onc    Preoperative Cardiovascular Risk Assessment Colonoscopy, TBD  Toribio Fortune, MD Gastroenterology and Hepatology [facility] Revised Cardiac Risk Index (RCRI) with 1 point and perioperative risk of major cardiac events is  0.9 %.  [facility] Activity Status Index (DASI) with 3.63 mets.  Patients activity is limited due to chronic SOB and hindrance of carrying O2 tank, however previously pulmonary cleared for coloscopy.  Per Pulm note 11/13/2023, pulmonary risk include weight and chronically sleeping in recliner. Noted concern for lying horizontal during procedure and may require some form of assistance with minimal sedation with Bipap or intubation as last resort. With recent hospitalization for respiratory failure, would consider follow up with pulmonary for clearance.  Previously cleared from cardiac standpoint by me on 11/28/2023, however, recent hospitalization 9/28-10/04/2023 admitted for acute on chronic respiratory failure with hypoxia. Suspect  secondary to decompensated CHF, mild COPD exacerbation, worsening anemia and also COVID infection. From cardiac standpoint, this patient is at acceptable risk for the planned procedure based on the results of ECHO. ??? With most recent echo showing mild to moderate AV stenosis then can proceed with colonscopy at [facility].  ROS: 10 point review of system has been reviewed and considered negative except ones been listed in the HPI.   Studies Reviewed: SABRA   ECHO 12/26/2023 IMPRESSIONS  1. Left ventricular ejection fraction, by estimation, is 60 to 65%. The left ventricle has normal function. The left ventricle has no regional wall motion abnormalities. There is mild left ventricular hypertrophy. Left ventricular diastolic parameters are consistent with Grade I diastolic dysfunction (impaired relaxation).  Elevated left ventricular end-diastolic pressure.   2. Right ventricular systolic function is normal. The right ventricular size is normal. There is normal pulmonary artery systolic pressure.   3. Left atrial size was mild to moderately dilated.   4. Right atrial size was moderately dilated.   5. The mitral valve is abnormal. No evidence of mitral valve regurgitation. No evidence of mitral stenosis. Moderate to severe mitral annular calcification.   6. The aortic valve is tricuspid. There is moderate calcification of the aortic valve. There is moderate thickening of the aortic valve. Aortic valve regurgitation is not visualized. Mild to moderate aortic stenosis is  present. Aortic  valve area, by VTI  measures 2.63 cm likely due to overestimated LVOT diameter but aortic  valve area by planimetry was 1.89 cm2 although this image was suboptimal. Based on restricted aortic valve leaflet excursion, aortic valve stenosis is mild to moderate. Aortic valve mean gradient measures 24.7 mmHg. Aortic valve Vmax measures 3.35 m/s.   7. Aortic dilatation noted. There is mild dilatation of the ascending aorta, measuring  41 mm.   8. The inferior vena cava is dilated in size with >50% respiratory variability, suggesting right atrial pressure of 8 mmHg.   Risk Assessment/Calculations:   {Does this patient have ATRIAL FIBRILLATION?[phone]}         Physical Exam:   VS:  There were no vitals taken for this visit.   Wt Readings from Last 3 Encounters:  01/16/24 286 lb 6 oz (129.9 kg)  01/11/24 280 lb (127 kg)  01/05/24 280 lb 3.2 oz (127.1 kg)    GEN: Well nourished, well developed in no acute distress while sitting in chair.  NECK: No JVD; No carotid bruits CARDIAC: ***RRR, no murmurs, rubs, gallops RESPIRATORY:  Clear to auscultation without rales, wheezing or rhonchi  ABDOMEN: Soft, non-tender, non-distended EXTREMITIES:  No edema; No deformity   ASSESSMENT AND PLAN: .   ***    {Are you ordering a CV Procedure (e.g. stress test, cath, DCCV, TEE, etc)?   Press F2        :[id number]}  Dispo: ***  Signed, Lorette CINDERELLA Kapur, PA-C

## 2024-01-16 NOTE — Patient Instructions (Signed)

## 2024-01-17 ENCOUNTER — Ambulatory Visit: Attending: Student | Admitting: Physician Assistant

## 2024-01-17 ENCOUNTER — Encounter: Payer: Self-pay | Admitting: Physician Assistant

## 2024-01-17 LAB — KAPPA/LAMBDA LIGHT CHAINS
Kappa free light chain: 8.7 mg/L (ref 3.3–19.4)
Kappa, lambda light chain ratio: 1.89 — ABNORMAL HIGH (ref 0.26–1.65)
Lambda free light chains: 4.6 mg/L — ABNORMAL LOW (ref 5.7–26.3)

## 2024-01-19 ENCOUNTER — Encounter: Payer: Self-pay | Admitting: *Deleted

## 2024-01-19 ENCOUNTER — Telehealth: Payer: Self-pay | Admitting: *Deleted

## 2024-01-20 LAB — MULTIPLE MYELOMA PANEL, SERUM
Albumin SerPl Elph-Mcnc: 2.9 g/dL (ref 2.9–4.4)
Albumin/Glob SerPl: 1.4 (ref 0.7–1.7)
Alpha 1: 0.3 g/dL (ref 0.0–0.4)
Alpha2 Glob SerPl Elph-Mcnc: 0.7 g/dL (ref 0.4–1.0)
B-Globulin SerPl Elph-Mcnc: 0.8 g/dL (ref 0.7–1.3)
Gamma Glob SerPl Elph-Mcnc: 0.4 g/dL (ref 0.4–1.8)
Globulin, Total: 2.2 g/dL (ref 2.2–3.9)
IgA: 37 mg/dL — ABNORMAL LOW (ref 64–422)
IgG (Immunoglobin G), Serum: 484 mg/dL — ABNORMAL LOW (ref 586–1602)
IgM (Immunoglobulin M), Srm: 16 mg/dL — ABNORMAL LOW (ref 26–217)
M Protein SerPl Elph-Mcnc: 0.2 g/dL — ABNORMAL HIGH
Total Protein ELP: 5.1 g/dL — ABNORMAL LOW (ref 6.0–8.5)

## 2024-01-22 ENCOUNTER — Encounter: Payer: Self-pay | Admitting: *Deleted

## 2024-01-22 ENCOUNTER — Ambulatory Visit (INDEPENDENT_AMBULATORY_CARE_PROVIDER_SITE_OTHER): Admitting: Gastroenterology

## 2024-01-29 ENCOUNTER — Ambulatory Visit (INDEPENDENT_AMBULATORY_CARE_PROVIDER_SITE_OTHER): Payer: Medicare PPO

## 2024-01-29 VITALS — BP 139/60 | HR 76 | Ht 62.0 in | Wt 280.0 lb

## 2024-01-29 DIAGNOSIS — Z0000 Encounter for general adult medical examination without abnormal findings: Secondary | ICD-10-CM

## 2024-01-29 NOTE — Progress Notes (Deleted)
 Subjective:   Savannah  LUCIANA Burns is a 73 y.o. female who presents for a [employer] Visit.  Allergies (verified) Keflex  [cephalexin ]   History: Past Medical History:  Diagnosis Date   Adrenal adenoma, left    Stable   Anxiety    Arthritis    bilateral hands   COPD (chronic obstructive pulmonary disease) (HCC)    Depression    Diabetes mellitus, type 2 (HCC) 08/12/2008   Qualifier: Diagnosis of  By: Jenetta MD, Talia     Dyspnea    Esophageal varices (HCC)    Grade II diastolic dysfunction    History of kidney stones    Hyperlipidemia    Hypertension    Lower back pain    Lower GI bleed 03/19/2020   Panic attacks    Pneumonia    currently taking antibiotic and prednisone  for early stages of pneumonia   Pulmonary nodules    bilateral   Skin cancer    face   Past Surgical History:  Procedure Laterality Date   BIOPSY  04/07/2020   Procedure: BIOPSY;  Surgeon: Cindie Carlin POUR, DO;  [facility];  Service: Endoscopy;;   Breast Cystectomy  Right    CESAREAN SECTION     COLONOSCOPY WITH PROPOFOL  N/A 01/25/2020   Dr. Cindie: Nonbleeding internal hemorrhoids, diverticulosis, 5 mm polyp removed from the ascending colon, 10 mm polyp removed from the sigmoid colon, 30 mm polyp (tubulovillous adenoma with no high-grade dysplasia) removed from the transverse colon via piecemeal status post tattoo.  Other polyps were tubular adenomas.  3 month surveillance colonoscopy recommended.   COLONOSCOPY WITH PROPOFOL  N/A 04/07/2020   Procedure: COLONOSCOPY WITH PROPOFOL ;  Surgeon: Cindie Carlin POUR, DO;  [facility];  Service: Endoscopy;  Laterality: N/A;  3:00pm, pt knows new time per office   CYSTOSCOPY/URETEROSCOPY/HOLMIUM LASER/STENT PLACEMENT Bilateral 03/01/2019   Procedure: CYSTOSCOPY/RETROGRADEURETEROSCOPY/HOLMIUM LASER/STENT PLACEMENT;  Surgeon: Devere Lonni Righter, MD;  [facility]: [facility];  Service: Urology;  Laterality: Bilateral;  ONLY NEEDS  60 MIN   ESOPHAGOGASTRODUODENOSCOPY (EGD) WITH PROPOFOL  N/A 01/25/2020   Dr. Cindie: 4 columns grade 1 esophageal varices   ESOPHAGOGASTRODUODENOSCOPY (EGD) WITH PROPOFOL  N/A 05/18/2020   Procedure: ESOPHAGOGASTRODUODENOSCOPY (EGD) WITH PROPOFOL ;  Surgeon: Cindie Carlin POUR, DO;  [facility];  Service: Endoscopy;  Laterality: N/A;   ESOPHAGOGASTRODUODENOSCOPY (EGD) WITH PROPOFOL  N/A 07/28/2020   Procedure: ESOPHAGOGASTRODUODENOSCOPY (EGD) WITH PROPOFOL ;  Surgeon: Cindie Carlin POUR, DO;  [facility];  Service: Endoscopy;  Laterality: N/A;  am or early PM due to givens capsule placement   GIVENS CAPSULE STUDY N/A 05/18/2020   Procedure: GIVENS CAPSULE STUDY;  Surgeon: Eartha Angelia Sieving, MD;  [facility];  Service: Gastroenterology;  Laterality: N/A;   GIVENS CAPSULE STUDY N/A 07/28/2020   Procedure: GIVENS CAPSULE STUDY;  Surgeon: Cindie Carlin POUR, DO;  [facility];  Service: Endoscopy;  Laterality: N/A;   IR IMAGING GUIDED PORT INSERTION  12/02/2021   IRRIGATION AND DEBRIDEMENT ABSCESS Right 09/28/2022   Procedure: IRRIGATION AND DEBRIDEMENT ABSCESS;  Surgeon: Evonnie Dorothyann LABOR, DO;  Location: [facility];  Service: General;  Laterality: Right;   POLYPECTOMY  01/25/2020   Procedure: POLYPECTOMY;  Surgeon: Cindie Carlin POUR, DO;  [facility];  Service: Endoscopy;;   POLYPECTOMY  04/07/2020   Procedure: POLYPECTOMY INTESTINAL;  Surgeon: Cindie Carlin POUR, DO;  [facility];  Service: Endoscopy;;   PORT-A-CATH REMOVAL Right 09/28/2022   Procedure: MINOR REMOVAL  PORT-A-CATH;  Surgeon: Evonnie Dorothyann LABOR, DO;  [facility];  Service: General;  Laterality: Right;   PORTACATH PLACEMENT Right 04/12/2023   Procedure: INSERTION PORT-A-CATH, RIJ;  Surgeon: Evonnie Dorothyann LABOR, DO;  [facility];  Service: General;  Laterality: Right;   SKIN CANCER EXCISION     Face   SPINE SURGERY     SUBMUCOSAL TATTOO INJECTION   01/25/2020   Procedure: SUBMUCOSAL TATTOO INJECTION;  Surgeon: Cindie Carlin POUR, DO;  [facility];  Service: Endoscopy;;   Family History  Problem Relation Age of Onset   Diabetes Father    Heart disease Father 21       MI   Hypertension Father    Anemia Mother        Transfusion dependent   COPD Sister    Cancer Paternal Grandmother 28       Pancreatic   Social History   Occupational History   Occupation: Retired   Tobacco Use   Smoking status: Former    Current packs/day: 0.00    Average packs/day: 1.5 packs/day for 40.0 years (60.0 ttl pk-yrs)    Types: Cigarettes    Start date: 04/29/1975    Quit date: 04/29/2015    Years since quitting: 8.7   Smokeless tobacco: Never   Tobacco comments:    Quit smoking 04/2015- Previous 1.5 ppd smoker  Vaping Use   Vaping status: Never Used  Substance and Sexual Activity   Alcohol  use: No    Alcohol /week: 0.0 standard drinks of alcohol    Drug use: No   Sexual activity: Not Currently    Birth control/protection: Post-menopausal   Tobacco Counseling Counseling given: Yes Tobacco comments: Quit smoking 04/2015- Previous 1.5 ppd smoker  SDOH Screenings   Food Insecurity: No Food Insecurity (01/29/2024)  Housing: Low Risk  (01/29/2024)  Transportation Needs: No Transportation Needs (01/29/2024)  Utilities: Not At Risk (01/29/2024)  Alcohol  Screen: Low Risk  (01/27/2023)  Depression (PHQ2-9): Low Risk  (01/29/2024)  Financial Resource Strain: Low Risk  (06/12/2023)  Physical Activity: Inactive (01/29/2024)  Social Connections: Socially Isolated (01/29/2024)  Stress: No Stress Concern Present (01/29/2024)  Tobacco Use: Medium Risk (01/29/2024)  Health Literacy: Adequate Health Literacy (01/29/2024)   Depression Screen    01/29/2024    1:33 PM 01/16/2024    9:18 AM 01/02/2024    3:44 PM 01/02/2024   10:47 AM 11/21/2023    8:28 AM 11/07/2023    9:12 AM 10/27/2023    8:22 AM  PHQ 2/9 Scores  PHQ - 2 Score 0 0 0 0 0 0 0     Goals  Addressed             This Visit's Progress    Exercise 150 minutes per week (moderate activity)   On track      Visit info / Clinical Intake: Medicare Wellness Visit Type:: Subsequent Annual Wellness Visit Medicare Wellness Visit Mode:: Telephone If telephone:: video declined If telephone or video:: vitals recorded from last visit Interpreter Needed?: No Pre-visit prep was completed: yes AWV questionnaire completed by patient prior to visit?: no Living arrangements:: with family/others Patient's Overall Health Status Rating: very good Typical amount of pain: none Does pain affect daily life?: no Are you currently prescribed opioids?: no  Dietary Habits and Nutritional Risks How many meals a day?: 2 Eats fruit and vegetables daily?: yes Most meals are obtained by: preparing own meals Diabetic:: (!) yes Any non-healing wounds?: no How often do you check your  BS?: 2 Would you like to be referred to a Nutritionist or for Diabetic Management? : no  Functional Status Activities of Daily Living (to include ambulation/medication): (!) Needs Assist Feeding: Independent Dressing/Grooming: Independent Bathing: Needs assistance Toileting: Independent Transfer: Independent Ambulation: Independent with device- listed below (walker) Medication Administration: Needs assistance (comment) (pt's grand daughter) Home Management: Needs assistance (comment) (pt's grand daughter) Manage your own finances?: yes Primary transportation is: family/friends Concerns about hearing?: no (have some hearing issues)  Fall Screening Falls in the past year?: 1 Number of falls in past year: 0 Was there an injury with Fall?: 0 Fall Risk Category Calculator: 1 Patient Fall Risk Level: Low Fall Risk  Fall Risk Patient at Risk for Falls Due to: Impaired balance/gait; Impaired mobility Fall risk Follow up: Falls evaluation completed; Education provided  Home and Transportation Safety: All rugs  have non-skid backing?: yes All stairs or steps have railings?: (!) no Grab bars in the bathtub or shower?: yes (none in shower) Have non-skid surface in bathtub or shower?: yes Good home lighting?: yes Regular seat belt use?: (!) no Hospital stays in the last year:: (!) yes How many hospital stays:: 2 Reason: for RSV/pnuemonia, COVID  2025  Cognitive Assessment Difficulty concentrating, remembering, or making decisions? : no Will 6CIT or Mini Cog be Completed: yes What year is it?: 0 points What month is it?: 0 points Give patient an address phrase to remember (5 components): [address] [place], [place] About what time is it?: 0 points Count backwards from 20 to 1: 0 points Say the months of the year in reverse: 0 points Repeat the address phrase from earlier: 0 points 6 CIT Score: 0 points  Advance Directives (For Healthcare) Does Patient Have a Medical Advance Directive?: No Does patient want to make changes to medical advance directive?: No - Patient declined Type of Advance Directive: Healthcare Power of [place]; Living will Copy of Healthcare Power of Attorney in Chart?: No - copy requested Copy of Living Will in Chart?: No - copy requested Would patient like information on creating a medical advance directive?: No - Patient declined  Reviewed/Updated  Reviewed/Updated: All; Medical History; Surgical History; Family History; Medications; Allergies; Care Teams; Patient Goals        Objective:    Today's Vitals   01/29/24 1342  BP: 139/60  Pulse: 76  Weight: 280 lb (127 kg)  Height: 5' 2 (1.575 m)   Body mass index is 51.21 kg/m.  Current Medications (verified) Outpatient Encounter Medications as of 01/29/2024  Medication Sig   acyclovir  (ZOVIRAX ) 400 MG tablet Take 1 tablet (400 mg total) by mouth 2 (two) times daily.   albuterol  (PROVENTIL ) (2.5 MG/3ML) 0.083% nebulizer solution Take 3 mLs (2.5 mg total) by nebulization every 4 (four) hours as needed for wheezing  or shortness of breath.   albuterol  (VENTOLIN  HFA) 108 (90 Base) MCG/ACT inhaler Inhale 2 puffs into the lungs every 6 (six) hours as needed for wheezing or shortness of breath.   bumetanide  (BUMEX ) 1 MG tablet Take 2 tablets (2 mg total) by mouth daily.   desvenlafaxine  (PRISTIQ ) 100 MG 24 hr tablet Take 100 mg by mouth in the morning.   ferrous sulfate  325 (65 FE) MG tablet Take 1 tablet (325 mg total) by mouth daily with breakfast.   fluticasone -salmeterol (ADVAIR) 100-50 MCG/ACT AEPB Inhale 1 puff into the lungs 2 (two) times daily.   folic acid  (FOLVITE ) 1 MG tablet Take 1 tablet (1 mg total) by mouth daily.  lidocaine -prilocaine  (EMLA ) cream Apply 1 Application topically as needed. Apply a small amount to port a cath site and cover with plastic wrap 1 hour prior to infusion appointments   lovastatin  (MEVACOR ) 20 MG tablet Take 1 tablet (20 mg total) by mouth at bedtime.   metolazone  (ZAROXOLYN ) 2.5 MG tablet Take 1 tablet (2.5 mg total) by mouth daily as needed.   nystatin  (MYCOSTATIN /NYSTOP ) powder Apply 1 Application topically 2 (two) times daily.   oxyCODONE -acetaminophen  (PERCOCET) 10-325 MG tablet Take 1 tablet by mouth 4 (four) times daily as needed for pain.   OXYGEN  Inhale 5 L into the lungs continuous. (Patient taking differently: Inhale 3 L into the lungs continuous.)   pantoprazole  (PROTONIX ) 40 MG tablet Take 1 tablet (40 mg total) by mouth 2 (two) times daily.   potassium chloride  SA (KLOR-CON  M) 20 MEQ tablet Take 1 tablet (20 mEq total) by mouth 2 (two) times daily.   No facility-administered encounter medications on file as of 01/29/2024.   Hearing/Vision screen Hearing Screening - Comments:: Pt have hearing dif Vision Screening - Comments:: Pt wear glasses for reading/last ov was years ago ie 20 Immunizations and Health Maintenance Health Maintenance  Topic Date Due   Zoster Vaccines- Shingrix (1 of 2) 09[birth year]12/1968   Diabetic kidney evaluation - Urine ACR  03/01/2018    COVID-19 Vaccine (2 - Moderna risk series) 07/10/2019   OPHTHALMOLOGY EXAM  08/06/2021   FOOT EXAM  09/04/2021   Influenza Vaccine  03/27/2024 (Originally 10/27/2023)   Mammogram  05/14/2024 (Originally 09/12/1989)   DEXA SCAN  05/14/2024 (Originally 09/13/2014)   Lung Cancer Screening  06/14/2024   HEMOGLOBIN A1C  06/23/2024   Diabetic kidney evaluation - eGFR measurement  01/15/2025   Medicare Annual Wellness (AWV)  01/28/2025   Colonoscopy  04/07/2030   DTaP/Tdap/Td (4 - Td or Tdap) 05/23/2032   Pneumococcal Vaccine: 50+ Years  Completed   Hepatitis C Screening  Completed   Meningococcal B Vaccine  Aged Out   Hepatitis B Vaccines 19-59 Average Risk  Discontinued        Assessment/Plan:  This is a routine wellness examination for Savannah Burns .  Patient Care Team: Jolinda Norene HERO, DO as PCP - General (Family Medicine) Alvan, Dorn FALCON, MD as PCP - Cardiology (Cardiology) Darlean Ned, MD (Rehabilitation) Shellia Oh, MD as Consulting Physician (Pulmonary Disease) Cindie Carlin POUR, DO as Consulting Physician (Internal Medicine) Grossman, Janelle, NP as Nurse Practitioner (Nurse Practitioner) Gaile Frederick, MD as Referring Physician (Gastroenterology) Celestia Joesph SQUIBB, RN as Oncology Nurse Navigator (Medical Oncology) Bertrum Rosina HERO, RN as [facility] Management Eartha Angelia Sieving, MD as Consulting Physician (Gastroenterology) Darlean Ozell NOVAK, MD as Consulting Physician (Pulmonary Disease)  I have personally reviewed and noted the following in the patient's chart:   Medical and social history Use of alcohol , tobacco or illicit drugs  Current medications and supplements including opioid prescriptions. Functional ability and status Nutritional status Physical activity Advanced directives List of other physicians Hospitalizations, surgeries, and ER visits in previous 12 months Vitals Screenings to include cognitive, depression, and falls Referrals and  appointments  No orders of the defined types were placed in this encounter.  In addition, I have reviewed and discussed with patient certain preventive protocols, quality metrics, and best practice recommendations. A written personalized care plan for preventive services as well as general preventive health recommendations were provided to patient.   Ozie Ned, CMA   01/29/2024   Return in 1 year (on 01/28/2025).  After Visit Summary: (  MyChart) Due to this being a telephonic visit, the after visit summary with patients personalized plan was offered to patient via MyChart   Nurse Notes: pt is aware and due the following: Diabetic eye--per pt can't afford vision coverage, Urine ACR, Shingles vaccine, foot exam is due, Lung cancer screening is due

## 2024-01-30 ENCOUNTER — Inpatient Hospital Stay

## 2024-01-30 ENCOUNTER — Inpatient Hospital Stay: Attending: Hematology

## 2024-01-30 ENCOUNTER — Encounter: Payer: Self-pay | Admitting: Oncology

## 2024-01-30 VITALS — HR 100

## 2024-01-30 DIAGNOSIS — Z79899 Other long term (current) drug therapy: Secondary | ICD-10-CM | POA: Diagnosis not present

## 2024-01-30 DIAGNOSIS — E538 Deficiency of other specified B group vitamins: Secondary | ICD-10-CM | POA: Diagnosis not present

## 2024-01-30 DIAGNOSIS — C9000 Multiple myeloma not having achieved remission: Secondary | ICD-10-CM | POA: Insufficient documentation

## 2024-01-30 DIAGNOSIS — Z95828 Presence of other vascular implants and grafts: Secondary | ICD-10-CM

## 2024-01-30 DIAGNOSIS — D500 Iron deficiency anemia secondary to blood loss (chronic): Secondary | ICD-10-CM

## 2024-01-30 DIAGNOSIS — Z5112 Encounter for antineoplastic immunotherapy: Secondary | ICD-10-CM | POA: Insufficient documentation

## 2024-01-30 DIAGNOSIS — D649 Anemia, unspecified: Secondary | ICD-10-CM

## 2024-01-30 LAB — COMPREHENSIVE METABOLIC PANEL WITH GFR
ALT: 9 U/L (ref 0–44)
AST: 19 U/L (ref 15–41)
Albumin: 3.4 g/dL — ABNORMAL LOW (ref 3.5–5.0)
Alkaline Phosphatase: 118 U/L (ref 38–126)
Anion gap: 9 (ref 5–15)
BUN: 13 mg/dL (ref 8–23)
CO2: 30 mmol/L (ref 22–32)
Calcium: 8.2 mg/dL — ABNORMAL LOW (ref 8.9–10.3)
Chloride: 101 mmol/L (ref 98–111)
Creatinine, Ser: 0.54 mg/dL (ref 0.44–1.00)
GFR, Estimated: >60 mL/min (ref >60)
Glucose, Bld: 187 mg/dL — ABNORMAL HIGH (ref 70–99)
Potassium: 3.7 mmol/L (ref 3.5–5.1)
Sodium: 140 mmol/L (ref 135–145)
Total Bilirubin: 0.4 mg/dL (ref 0.0–1.2)
Total Protein: 5.3 g/dL — ABNORMAL LOW (ref 6.5–8.1)

## 2024-01-30 LAB — CBC WITH DIFFERENTIAL/PLATELET
Abs Immature Granulocytes: 0.02 K/uL (ref 0.00–0.07)
Basophils Absolute: 0 K/uL (ref 0.0–0.1)
Basophils Relative: 0 %
Eosinophils Absolute: 0 K/uL (ref 0.0–0.5)
Eosinophils Relative: 0 %
HCT: 24 % — ABNORMAL LOW (ref 36.0–46.0)
Hemoglobin: 7 g/dL — ABNORMAL LOW (ref 12.0–15.0)
Immature Granulocytes: 1 %
Lymphocytes Relative: 29 %
Lymphs Abs: 1.2 K/uL (ref 0.7–4.0)
MCH: 30 pg (ref 26.0–34.0)
MCHC: 29.2 g/dL — ABNORMAL LOW (ref 30.0–36.0)
MCV: 103 fL — ABNORMAL HIGH (ref 80.0–100.0)
Monocytes Absolute: 0.4 K/uL (ref 0.1–1.0)
Monocytes Relative: 9 %
Neutro Abs: 2.5 K/uL (ref 1.7–7.7)
Neutrophils Relative %: 61 %
Platelets: 97 K/uL — ABNORMAL LOW (ref 150–400)
RBC: 2.33 MIL/uL — ABNORMAL LOW (ref 3.87–5.11)
RDW: 19.9 % — ABNORMAL HIGH (ref 11.5–15.5)
WBC: 4.1 K/uL (ref 4.0–10.5)
nRBC: 0 % (ref 0.0–0.2)

## 2024-01-30 LAB — SAMPLE TO BLOOD BANK

## 2024-01-30 LAB — MAGNESIUM: Magnesium: 2.1 mg/dL (ref 1.7–2.4)

## 2024-01-30 MED ORDER — SODIUM CHLORIDE 0.9% FLUSH: 10.0000 mL | INTRAVENOUS | Status: DC | PRN

## 2024-01-30 MED ORDER — DARATUMUMAB-HYALURONIDASE-FIHJ 1800-30000 MG-UT/15ML ~~LOC~~ SOLN
1800.0000 mg | Freq: Once | SUBCUTANEOUS | Status: AC
  Administered 2024-01-30: 1800 mg via SUBCUTANEOUS
  Filled 2024-01-30: qty 15

## 2024-01-30 MED ORDER — DIPHENHYDRAMINE HCL 25 MG PO CAPS
25.0000 mg | ORAL_CAPSULE | Freq: Once | ORAL | Status: DC
  Filled 2024-01-30: qty 1

## 2024-01-30 MED ORDER — DIPHENHYDRAMINE HCL 25 MG PO CAPS
50.0000 mg | ORAL_CAPSULE | Freq: Once | ORAL | Status: AC
  Administered 2024-01-30: 50 mg via ORAL
  Filled 2024-01-30: qty 2

## 2024-01-30 MED ORDER — ACETAMINOPHEN 325 MG PO TABS
650.0000 mg | ORAL_TABLET | Freq: Once | ORAL | Status: AC
  Administered 2024-01-30: 650 mg via ORAL
  Filled 2024-01-30: qty 2

## 2024-01-30 MED ORDER — CYANOCOBALAMIN 1000 MCG/ML IJ SOLN
1000.0000 ug | Freq: Once | INTRAMUSCULAR | Status: AC
  Administered 2024-01-30: 1000 ug via INTRAMUSCULAR
  Filled 2024-01-30: qty 1

## 2024-01-30 MED ORDER — SODIUM CHLORIDE 0.9% IV SOLUTION
250.0000 mL | INTRAVENOUS | Status: DC
  Administered 2024-01-30: 100 mL via INTRAVENOUS

## 2024-01-30 NOTE — Progress Notes (Addendum)
 Pretreatment labs today show Hgb 7.0. Patient reports intermittent melanotic stool 3-4 times weekly for the past 2 weeks. She has increasing fatigue and dyspnea on exertion. Her blood showed antibodies, therefore PRBC transfusion will be given tomorrow (Wednesday 01/31/2024). We will recheck CBC again next week. Patient given instructions to proceed to emergency department if ongoing melena or worsening symptoms.  We will proceed with Darzalex .  Patient missed her appointment with Dr. Eartha (GI) on 01/22/2024.  I have reached out to GI office to coordinate rescheduling missed appointment ASAP.  Pleasant CHRISTELLA Barefoot, PA-C 01/30/24 11:48 AM

## 2024-01-30 NOTE — Patient Instructions (Addendum)
 [facility] at [facility] **VISIT SUMMARY & IMPORTANT INSTRUCTIONS **   You were seen today by Pleasant Barefoot PA-C for your worsening anemia.    Your hemoglobin today was 7.0.  This is a significant drop from your previous blood levels. This is due to recurrent GI bleeding (i.e. blood loss from somewhere in your stomach or intestines). It is EXTREMELY IMPORTANT that you follow-up with outpatient gastroenterologist (Dr. Eartha).  Please call his office at [phone] to reschedule your missed appointment ASAP. You will receive blood transfusion tomorrow (Wednesday 01/31/2024). We will check your blood again next week to see if you need another blood transfusion. If you have ongoing black/tarry bowel movements or feel worse, please go to the EMERGENCY DEPARTMENT for additional intervention.

## 2024-01-30 NOTE — Progress Notes (Signed)
 Patient tolerated Darzalex  SQ injection with no complaints voiced.  See MAR for details.  Labs reviewed. Hgb 7.0. Per Pleasant PA to proceed with Darzalex  SQ and pt to receive 1 unit of RBCs on 01/31/24. Pt updated and all questions answered at this time. Injection site clean and dry with no bruising or swelling noted at site.  Band aid applied.  Vss with discharge and left in satisfactory condition with no s/s of distress noted. Pt escorted via wheelchair to be driven home by granddaughter.  Savannah Burns

## 2024-01-30 NOTE — Progress Notes (Signed)
 Blood bank made aware of blood transfusion today for Hgb 7 with understanding verbalized.   Patient has antibodies.  Orders for blood transfusion entered for 01/31/2024 with confirmation with blood bank.

## 2024-01-31 ENCOUNTER — Inpatient Hospital Stay

## 2024-01-31 VITALS — BP 114/73 | HR 92 | Temp 97.4°F | Resp 20

## 2024-01-31 DIAGNOSIS — C9000 Multiple myeloma not having achieved remission: Secondary | ICD-10-CM

## 2024-01-31 DIAGNOSIS — Z5112 Encounter for antineoplastic immunotherapy: Secondary | ICD-10-CM | POA: Diagnosis not present

## 2024-01-31 DIAGNOSIS — D500 Iron deficiency anemia secondary to blood loss (chronic): Secondary | ICD-10-CM

## 2024-01-31 DIAGNOSIS — Z95828 Presence of other vascular implants and grafts: Secondary | ICD-10-CM

## 2024-01-31 DIAGNOSIS — D649 Anemia, unspecified: Secondary | ICD-10-CM

## 2024-01-31 LAB — PREPARE RBC (CROSSMATCH)

## 2024-01-31 MED ORDER — ACETAMINOPHEN 325 MG PO TABS
650.0000 mg | ORAL_TABLET | Freq: Once | ORAL | Status: AC
  Administered 2024-01-31: 650 mg via ORAL
  Filled 2024-01-31: qty 2

## 2024-01-31 MED ORDER — SODIUM CHLORIDE 0.9% IV SOLUTION
250.0000 mL | INTRAVENOUS | Status: DC
  Administered 2024-01-31: 250 mL via INTRAVENOUS

## 2024-01-31 MED ORDER — DIPHENHYDRAMINE HCL 25 MG PO CAPS
25.0000 mg | ORAL_CAPSULE | Freq: Once | ORAL | Status: AC
  Administered 2024-01-31: 25 mg via ORAL
  Filled 2024-01-31: qty 1

## 2024-01-31 NOTE — Patient Instructions (Signed)
 [facility] - A DEPT OF [facility]  Discharge Instructions: Thank you for choosing [facility] to provide your oncology and hematology care.  If you have a lab appointment with the Cancer Center - please note that after April 8th, 2024, all labs will be drawn in the cancer center.  You do not have to check in or register with the main entrance as you have in the past but will complete your check-in in the cancer center.  Wear comfortable clothing and clothing appropriate for easy access to any Portacath or PICC line.   We strive to give you quality time with your provider. You may need to reschedule your appointment if you arrive late (15 or more minutes).  Arriving late affects you and other patients whose appointments are after yours.  Also, if you miss three or more appointments without notifying the office, you may be dismissed from the clinic at the provider's discretion.      For prescription refill requests, have your pharmacy contact our office and allow 72 hours for refills to be completed.    Today you received the following 1 unit of blood, return as scheduled.   To help prevent nausea and vomiting after your treatment, we encourage you to take your nausea medication as directed.  BELOW ARE SYMPTOMS THAT SHOULD BE REPORTED IMMEDIATELY: *FEVER GREATER THAN 100.4 F (38 C) OR HIGHER *CHILLS OR SWEATING *NAUSEA AND VOMITING THAT IS NOT CONTROLLED WITH YOUR NAUSEA MEDICATION *UNUSUAL SHORTNESS OF BREATH *UNUSUAL BRUISING OR BLEEDING *URINARY PROBLEMS (pain or burning when urinating, or frequent urination) *BOWEL PROBLEMS (unusual diarrhea, constipation, pain near the anus) TENDERNESS IN MOUTH AND THROAT WITH OR WITHOUT PRESENCE OF ULCERS (sore throat, sores in mouth, or a toothache) UNUSUAL RASH, SWELLING OR PAIN  UNUSUAL VAGINAL DISCHARGE OR ITCHING   Items with * indicate a potential emergency and should be followed up as soon as  possible or go to the Emergency Department if any problems should occur.  Please show the CHEMOTHERAPY ALERT CARD or IMMUNOTHERAPY ALERT CARD at check-in to the Emergency Department and triage nurse.  Should you have questions after your visit or need to cancel or reschedule your appointment, please contact [facility] [place] - A DEPT OF [facility] [phone]  and follow the prompts.  Office hours are 8:00 a.m. to 4:30 p.m. Monday - Friday. Please note that voicemails left after 4:00 p.m. may not be returned until the following business day.  We are closed weekends and major holidays. You have access to a nurse at all times for urgent questions. Please call the main number to the clinic [phone] and follow the prompts.  For any non-urgent questions, you may also contact your provider using MyChart. We now offer e-Visits for anyone 3 and older to request care online for non-urgent symptoms. For details visit [email or web address].   Also download the MyChart app! Go to the app store, search MyChart, open the app, select [facility], and log in with your MyChart username and password.

## 2024-01-31 NOTE — Progress Notes (Signed)
 Patient tolerated 1 unit of blood with no complaints voiced. Side effects with management reviewed with understanding verbalized. Port site clean and dry with no bruising or swelling noted at site. Good blood return noted before and after administration of therapy. Band aid applied. Patient left in satisfactory condition with VSS and no s/s of distress noted.

## 2024-01-31 NOTE — Progress Notes (Signed)
 SPIRITUAL CARE AND COUNSELING CONSULT NOTE   VISIT SUMMARY   Reason for Visit: Chaplain making scheduled follow-up with Pt I previously connected with.  Description of Visit: I entered the room and found Savannah Burns  sitting in the chair receiving her treatment, with no support person present   Savannah Burns  described some repairs being made in her home.  The process of repairs is disrupting her daily routines and is a source of stress to her, but she is glad it's almost done.    Pt continues to be distressed by her gastric issues and it about at her wits ends with her gastroenterologist.  She states, I'm about to go to [facility] and have them fix it again.  I want to support her in this and help her build resilience in her life.    Savannah Burns  asked me to pray for a father in her church who is having a health crisis.  I prayed for him and for her prior to exiting.  Plan of Care: I will continue to follow Savannah Burns  on a monthly basis.   Savannah Burns, MDiv  Chaplain, [facility] [email or web address] [phone]   SPIRITUAL ENCOUNTER                                                                                                                                                                      Type of Visit: Follow up Care provided to:: Patient Referral source: Chaplain assessment Reason for visit: Routine spiritual support OnCall Visit: No   SPIRITUAL FRAMEWORK  Presenting Themes: Goals in life/care, Significant life change, Caregiving needs, Rituals and practive Values/beliefs: Savannah Burns  loves her greatgranddaughter who lives with her. She is also a regular church goer and practices the [employer] faith in the baptist(?) tradition Community/Connection: Family, Faith community Strengths: Humor, Love, and zest for life Needs/Challenges/Barriers: Currently struggling with gastic issues she is hoping to get addressed. Patient Stress Factors: Health changes (She says I don;t  feel like myself) Family Stress Factors: None identified   GOALS   Self/Personal Goals: Get back to feeling more like herself Clinical Care Goals: Build/Support resiliance in Pt   INTERVENTIONS   Spiritual Care Interventions Made: Compassionate presence, Reflective listening, Explored values/beliefs/practices/strengths, Prayer, Encouragement    INTERVENTION OUTCOMES   Outcomes: Connection to spiritual care, Awareness of support, Reduced anxiety  SPIRITUAL CARE PLAN   Spiritual Care Issues Still Outstanding: Chaplain will continue to follow    Savannah Burns, MDiv Chaplain, [facility] [email or web address] [phone]  01/31/2024 2:14 PM

## 2024-02-01 LAB — TYPE AND SCREEN
ABO/RH(D): A POS
Antibody Screen: POSITIVE
Donor AG Type: NEGATIVE
Unit division: 0

## 2024-02-01 LAB — BPAM RBC
Blood Product Expiration Date: 202512082359
ISSUE DATE / TIME: 202511050955
Unit Type and Rh: 6200

## 2024-02-02 ENCOUNTER — Other Ambulatory Visit: Payer: Self-pay

## 2024-02-05 ENCOUNTER — Ambulatory Visit: Payer: Self-pay | Admitting: Physician Assistant

## 2024-02-05 ENCOUNTER — Inpatient Hospital Stay

## 2024-02-05 DIAGNOSIS — Z5112 Encounter for antineoplastic immunotherapy: Secondary | ICD-10-CM | POA: Diagnosis not present

## 2024-02-05 DIAGNOSIS — D500 Iron deficiency anemia secondary to blood loss (chronic): Secondary | ICD-10-CM

## 2024-02-05 DIAGNOSIS — C9000 Multiple myeloma not having achieved remission: Secondary | ICD-10-CM

## 2024-02-05 LAB — CBC WITH DIFFERENTIAL/PLATELET
Abs Immature Granulocytes: 0 K/uL (ref 0.00–0.07)
Basophils Absolute: 0 K/uL (ref 0.0–0.1)
Basophils Relative: 0 %
Eosinophils Absolute: 0 K/uL (ref 0.0–0.5)
Eosinophils Relative: 1 %
HCT: 21.9 % — ABNORMAL LOW (ref 36.0–46.0)
Hemoglobin: 6.2 g/dL — CL (ref 12.0–15.0)
Immature Granulocytes: 0 %
Lymphocytes Relative: 29 %
Lymphs Abs: 1.2 K/uL (ref 0.7–4.0)
MCH: 28.8 pg (ref 26.0–34.0)
MCHC: 28.3 g/dL — ABNORMAL LOW (ref 30.0–36.0)
MCV: 101.9 fL — ABNORMAL HIGH (ref 80.0–100.0)
Monocytes Absolute: 0.4 K/uL (ref 0.1–1.0)
Monocytes Relative: 10 %
Neutro Abs: 2.5 K/uL (ref 1.7–7.7)
Neutrophils Relative %: 60 %
Platelets: 91 K/uL — ABNORMAL LOW (ref 150–400)
RBC: 2.15 MIL/uL — ABNORMAL LOW (ref 3.87–5.11)
RDW: 18.8 % — ABNORMAL HIGH (ref 11.5–15.5)
WBC: 4.1 K/uL (ref 4.0–10.5)
nRBC: 0 % (ref 0.0–0.2)

## 2024-02-05 LAB — BPAM RBC
Blood Product Expiration Date: 202511282359
Blood Product Expiration Date: 202511282359
ISSUE DATE / TIME: 202510301717
Unit Type and Rh: 6200

## 2024-02-05 LAB — PREPARE RBC (CROSSMATCH)

## 2024-02-05 LAB — SAMPLE TO BLOOD BANK

## 2024-02-05 NOTE — Progress Notes (Signed)
 CRITICAL VALUE ALERT Critical value received:  HGB 6.2 Date of notification:  02-05-24 Time of notification: 1133 Critical value read back:  Yes.   Nurse who received alert:  C. Cicilia Clinger RN MD notified time and response:  Martine Barefoot PA-C. Will give 2 units of blood tomorrow per provider

## 2024-02-05 NOTE — H&P (Addendum)
 Hospital Medicine Admission History & Physical  Time of Service: 02/05/2024, 11:24 PM  PCP: Jolinda Norene HERO, DO, Phone [phone], Fax [phone]   Chief Complaint  melena  History of Present Illness  Justene  Barfuss is a 73 y.o. female with a history of multiple myeloma currently receiving daratumumab , COPD on home O2 3 L [place], MASLD cirrhosis w/ h/o esophageal varices, small bowel angioectasia, prior skin abscess c/b MSSA bacteremia, HFpEF, aortic stenosis, type 2 diabetes, hypertension, adrenal adenoma, who presented to the ED with melena and Hgb decrease to ~6.  Pt was recently admitted from 9/28 to 12/28/2023 due to worsening hypoxia and shortness of breath at OSH in the setting of a COVID infection, pt was treated with steroids and diuretics, pt needed up to 4 L [place] compared to baseline of 3 L [place]. Due to pt's respiratory status, endoscopy was deferred to outpatient and pt received Protonix  and Octreotide . Echo showed ejection fraction 60 to 65%, grade 1 diastolic dysfunction and mild left ventricle hypertrophy. S/p 2U PRBC blood transfusion 12/26/2023.  Pt has been having melena and has been receiving blood transfusions from her oncology clinic, pt recently with Hgb 6.2 after receiving a blood transfusion on 11/5.   Previously pt was admitted from 5/5 to 08/04/2021 due to GI bleed, found to have small bowel angioectasia treated with APC, EGD also showed grade I esophageal varices.   In the ED, temp 98.5, HR 108, RR 20, BP 106/76, 95% on 3 L [place]. Labs showed Hgb 6.4, platelets 103. K 3.2. Pt received ceftriaxone , octreotide , KCL, and one unit of pRBCs.   Pt denies any fevers, chills, or chest pain, however reports shortness of breath and dyspnea that have been increasing since her last discharge about a one ago. Pt also reports leg swelling that has increased. Pt reports the dyspnea was improved after her last OSH admission but since then, over the past 2 weeks she has dyspnea with  exertion and worsening LE edema and feels fluid up. Pt denies any abd pain, nausea or vomiting, pt denies any diarrhea but reports constipation, pt describes formed black tarry stools. Pt denies any dysuria, frequency urgency or hematuria.   Medical History  Past Medical History Past Medical History:  Diagnosis Date  . COPD (chronic obstructive pulmonary disease) (CMS/HHS-HCC)   . Melena 05/16/2020  . Multiple myeloma (CMS/HHS-HCC)     Past Surgical History Past Surgical History:  Procedure Laterality Date  . EGD N/A 08/01/2021   Procedure: Upper Endoscopy (EGD);  Surgeon: Dinani, Amreen, MD;  Location: [facility];  Service: Gastroenterology;  Laterality: N/A;  . CAPSULE ENDOSCOPY GASTROINTESTINAL TRACT N/A 08/01/2021   Procedure: CAPSULE ENDOSCOPY GASTROINTESTINAL TRACT;  Surgeon: Dinani, Amreen, MD;  Location: [facility];  Service: Gastroenterology;  Laterality: N/A;  . SMALL INTESTINE ENDOSCOPY W/CONTROL BLEEDING N/A 08/02/2021   Procedure: Small Bowel Enteroscopy;  Surgeon: Gala Prentice Scarlet, MD;  Location: [facility];  Service: Gastroenterology;  Laterality: N/A;    Family History Family History  Problem Relation Name Age of Onset  . High blood pressure (Hypertension) Mother    . High blood pressure (Hypertension) Father      Social History Social History   Socioeconomic History  . Marital status: Unknown  Tobacco Use  . Smoking status: Former    Current packs/day: 0.00    Average packs/day: 1 pack/day for 30.0 years (30.0 ttl pk-yrs)    Types: Cigarettes    Start date: 46    Quit date: 2009  Years since quitting: 16.8  . Smokeless tobacco: Never  Vaping Use  . Vaping status: Never Used  Substance and Sexual Activity  . Alcohol  use: Not Currently  . Drug use: Never   Social Drivers of Corporate Investment Banker Strain: Low Risk  (06/12/2023)   Received from [facility]   Overall Financial Resource Strain (CARDIA)   . Difficulty of Paying Living  Expenses: Not very hard  Food Insecurity: No Food Insecurity (01/29/2024)   Received from [facility]   Hunger Vital Sign   . Within the past 12 months, you worried that your food would run out before you got the money to buy more.: Never true   . Within the past 12 months, the food you bought just didn't last and you didn't have money to get more.: Never true  Transportation Needs: No Transportation Needs (01/29/2024)   Received from [facility] - Transportation   . In the past 12 months, has lack of transportation kept you from medical appointments or from getting medications?: No   . In the past 12 months, has lack of transportation kept you from meetings, work, or from getting things needed for daily living?: No    Allergies & Medications   Allergies  Allergen Reactions  . Keflex  [Cephalexin ] Vomiting    Medications Prior to Admission Medications  Prescriptions Last Dose Taking?  FEROSUL 325 mg (65 mg iron ) tablet  No  Sig: TAKE 1 TABLET BY MOUTH TWICE DAILY WITH A MEAL  Patient not taking: Reported on 11/10/2021  FUROsemide  (LASIX ) 20 MG tablet  No  Sig: Take 1 tablet (20 mg total) by mouth once daily as needed for Edema (for weight gain of 2 pounds in one day or 5 pounds in one week) for up to 30 days  carvediloL  (COREG ) 6.25 MG tablet  No  Sig: Take 1 tablet (6.25 mg total) by mouth 2 (two) times daily with meals  cholecalciferol (VITAMIN D3) 2,000 unit tablet  No  Sig: Take by mouth  lactulose (ENULOSE) 10 gram/15 mL oral solution  No  Sig: Take 30 mLs by mouth once daily  lovastatin  (MEVACOR ) 20 MG tablet  No  Sig: Take 20 mg by mouth at bedtime  oxyCODONE -acetaminophen  (PERCOCET) 10-325 mg tablet  No  Sig: Take 1 tablet by mouth 4 (four) times daily as needed    Facility-Administered Medications: None     Review of Systems  (Patient endorses checked symptoms, denies others)  Constitutional: []  fever  []  rigors  []  night sweats  []  unexpected weight change   Ophthalmologic: []  recent vision changes  Otolaryngologic: []  recent hearing changes  []  rhinorrhea  Oropharynx: []  sore throat  Respiratory: [x]  dyspnea  []  cough  []  hemoptysis  Cardiovascular: []  chest pain  []  palpitations  []  orthopnea  []  PND  Gastrointestinal:  []  abdominal pain  []  nausea  []  vomiting  []  diarrhea  [x]  constipation  []  melena  Genitourinary: []  dysuria  []  hematuria  []  frequency  Musculoskeletal:  []  joint pain  []  myalgias  Neurologic:  []  headache  []  neuropathy  []  generalized weakness  Hematologic: [x]  bruising/bleeding   Dermatologic: []  rash    Physical Exam    Current Vital Signs 24h Vital Sign Ranges  T 36.7 C (98.1 F) (02/05/24 2319) Temp  Avg: 36.8 C (98.3 F)  Min: 36.7 C (98.1 F)  Max: 36.9 C (98.5 F)  BP (!) 99/38 (02/05/24 2319) BP  Min:  94/81  Max: 130/58  HR 108 (02/05/24 2319) Pulse  Avg: 107.5  Min: 106  Max: 109  RR 16 (02/05/24 2319) Resp  Avg: 21.4  Min: 16  Max: 28  O2sat 100 %   SpO2  Avg: 98.3 %  Min: 95 %  Max: 100 %  Weight     There is no height or weight on file to calculate BMI. General: alert, cooperative, in NAD Eyes: conjunctiva clear, anicteric sclera HENT: oropharynx clear, moist mucous membranes nasal cannula in place Neck: no adenopathy, no JVD, supple, symmetrical, trachea midline, and no thyromegaly CV: regular rate and rhythm, without murmurs, rubs or gallops Resp: normal work of breath sitting upright, clear to auscultation, good air exchange Abd: soft, nontender, nondistended, normoactive bowel sounds  Rectal: deferred Ext: 3+ lower extremity edema bilaterally  Skin: no rashes or lesions Psych: oriented to time, place and person, mood and affect are appropriate Neuro: CN II-XII intact. Grossly normal and symmetric strength in upper and lower extremities. Intact sensation to light touch throughout. Normal coordination.  Data   Recent Results (from the past 24 hours)  ECG 12-lead   Collection Time:  02/05/24  3:15 PM  Result Value Ref Range   Vent Rate (bpm) 107    PR Interval (msec) 152    QRS Interval (msec) 134    QT Interval (msec) 372    QTc (msec) 496   Complete Blood Count (CBC) with Differential   Collection Time: 02/05/24  5:10 PM  Result Value Ref Range   WBC (White Blood Cell Count) 4.8 3.2 - 9.8 x10^9/L   Hemoglobin 6.4 (LL) 11.7 - 15.5 g/dL   Hematocrit 77.4 (L) 64.9 - 45.0 %   Platelets 103 (L) 150 - 450 x10^9/L   MCV (Mean Corpuscular Volume) 101 (H) 80 - 98 fL   MCH (Mean Corpuscular Hemoglobin) 28.7 26.5 - 34.0 pg   MCHC (Mean Corpuscular Hemoglobin Concentration) 28.4 (L) 31.0 - 36.0 %   RBC (Red Blood Cell Count) 2.23 (L) 3.77 - 5.16 x10^12/L   RDW-CV (Red Cell Distribution Width) 18.9 (H) 11.5 - 14.5 %   NRBC (Nucleated Red Blood Cell Count) 0.00 0 x10^9/L   NRBC % (Nucleated Red Blood Cell %) 0.0 %   MPV (Mean Platelet Volume) 12.9 (H) 7.2 - 11.7 fL   Neutrophil Count 2.6 2.0 - 8.6 x10^9/L   Neutrophil % 55.5 37 - 80 %   Lymphocyte Count 1.6 0.6 - 4.2 x10^9/L   Lymphocyte % 33.4 10 - 50 %   Monocyte Count 0.5 0 - 0.9 x10^9/L   Monocyte % 10.1 0 - 12 %   Eosinophil Count 0.02 0 - 0.70 x10^9/L   Eosinophil % 0.4 0 - 7 %   Basophil Count 0.01 0 - 0.20 x10^9/L   Basophil % 0.2 0 - 2 %   Slide Review/Morphology Yes    Immature Granulocyte Count 0.02 <=0.06 x10^9/L   Immature Granulocyte % 0.4 <=0.7 %   Immature Platelet Fraction 16.7 (H) 1.6 - 8.5 %  Activated Partial Thromboplastin Time (APTT)   Collection Time: 02/05/24  5:10 PM  Result Value Ref Range   Act Partial Thromboplastin Time 26.4 (L) 26.8 - 37.1 sec  Prothrombin Time (INR)   Collection Time: 02/05/24  5:10 PM  Result Value Ref Range   Prothrombin Time 10.9 9.5 - 13.1 sec   Prothrombin INR 1.0 0.9 - 1.1  Type And Screen   Collection Time: 02/05/24  5:10 PM  Result  Value Ref Range   ABO RH TYPE A Positive    Antibody Screen Positive    Specimen Outdate 02-08-2024 23:59    Performing  [facility]LOOD [facility] LAB   Comprehensive Metabolic Panel (CMP)   Collection Time: 02/05/24  5:10 PM  Result Value Ref Range   Sodium 140 135 - 145 mmol/L   Potassium 3.2 (L) 3.5 - 5.0 mmol/L   Chloride 104 98 - 108 mmol/L   Carbon Dioxide (CO2) 29 21 - 30 mmol/L   Urea Nitrogen (BUN) 13 7 - 20 mg/dL   Creatinine 0.7 0.4 - 1.0 mg/dL   Glucose 864 70 - 859 mg/dL   Calcium  7.9 (L) 8.7 - 10.2 mg/dL   AST (Aspartate Aminotransferase) 16 15 - 41 U/L   ALT (Alanine Aminotransferase) 11 10 - 39 U/L   Bilirubin, Total 0.5 0.4 - 1.5 mg/dL   Alk Phos (Alkaline Phosphatase) 83 24 - 110 U/L   Albumin 2.6 (L) 3.5 - 4.8 g/dL   Protein, Total 4.7 (L) 6.2 - 8.1 g/dL   Anion Gap 7 3 - 12 mmol/L   BUN/CREA Ratio 20 6 - 27   Glomerular Filtration Rate (eGFR)  91 mL/min/1.73sq m  Lipase   Collection Time: 02/05/24  5:10 PM  Result Value Ref Range   Lipase 66 (H) 17 - 51 U/L  ANTIBODY IDENTIFICATION   Collection Time: 02/05/24  5:10 PM  Result Value Ref Range   Antibody Identification Positive   Prepare RBC: 1 Units   Collection Time: 02/05/24 10:04 PM  Result Value Ref Range   Product Identification Red Blood Cells    Product Code [id number]    Status Information Transfused    Blood Expiration Date 20251201235900    Unit Number [id number]    Unit Blood Type A Pos    Cross Match Result Compatible    Coded Unit Blood Type 6200    Issue Date/Time 02-05-2024 22:52     EKG:  Sinus tachycardia  Right bundle branch block  Prolonged QT (496) Abnormal ECG   Radiology Studies on Admission: No results found.  Assessment & Plan  Jamileth  Winnick is a 73 y.o. female admitted for the following problems: Principal Problem:   Melena Active Problems:   Chronic diastolic HF (heart failure) (CMS/HHS-HCC)   COPD (chronic obstructive pulmonary disease) (CMS/HHS-HCC)   Hypertension associated with diabetes (CMS/HHS-HCC)   Supplemental oxygen  dependent   Cirrhosis of liver (CMS/HHS-HCC)   Blanch   Defibaugh is a 73 y.o. female with a history of multiple myeloma currently receiving daratumumab , COPD on home O2 3 L [place], MASLD cirrhosis w/ h/o esophageal varices, prior skin abscess c/b MSSA bacteremia, HFpEF, aortic stenosis, type 2 diabetes, hypertension, adrenal adenoma, who presented to the ED with ongoing melena and Hgb decrease to ~6.  #Melena   Pt w/ a prior history of melena in 07/2021 found to have small bowel angioectasia, underwent EGD, then VCE and SBE with APC of small bowel angioectasia. Pt reports that over the past several weeks to ~one month, she has had melena consisting of dark tarry stools. Pt denies any hematochezia or hematemesis. Hgb 7.0 on 11/4 and received one unit of blood on 11/5. Pt also received blood during her OSH admission in 12/24/2023. Repeat endoscopy has been delayed by recent COVID infection in 11/2023 and need for cardiac clearance. Pt denies any NSAID use. Pt reports she is having iron  pills at home. -continue IV PPI BID (pt reports she is either taking Nexium  or  Protonix  at home) -continue ceftriaxone  and octreotide  started in the ED (11/10-->) -continue NPO at [facility] -consult GI this AM -continue T&S and two IVs -repeat echo not yet ordered  #Acute on chronic anemia #Auto-antibodies #Thrombocytopenia  Recent Hgb values in Care Everywhere have been ~8-9, with transfusions being given w/ oncology and while pt was hospitalized at OSH in 11/2023. Platelet values recently in Care Everywhere ~90 to 100 k.  -follow up repeat CBC after receiving blood  #Multiple myeloma Pt reports her most recent chemotherapy with Darzalex  was on 11/4 and she is receiving chemo every 4 weeks, w/ dexamethasone  prior to receiving chemo, not currently on lenalidomide .  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 Pt reports that after receiving IV diuresis during her last OSH admission, her dyspnea w/ exertion improved and her leg swelling improved. However, after two week at home, she is having dyspnea and LE swelling again, and has been taking Bumex  at home, not she is not sure if she's taking metolazone  at home.  [ ]  pending any procedures, can order Lasix  40 mg IV tomorrow -continue strict I/O and standing weights -can restart home Bumex  2 mg daily and home metolazone  2.5 mg as needed once pt has responded to IV diuresis  -consider consult to cardiology re: pre-procedure optimization, pt reports she cannot lay flat for more than a few seconds -continue home lovastatin   #Hypokalemia Pt reports she is taking KCL 20 meq BID at home. K 3.2 on admission. Received KCL powder in the ED. -follow up on repeat labs this AM  #COPD on 3 L [place] Currently no signs of a COPD exacerbation. -continue home Advair and home neb albuterol  PRN -pt reports she does not use CPAP or BiPAP at home, sleep study as been recommended by OSH previously  #MASLD cirrhosis #History of grade I esophageal varices  Pt is not currently taking Lactulose at home. Unclear if pt has ascites. No signs of HE currently. MELD 6 on admission.  -unclear if pt is taking Coreg , pt's granddaughter is coming in tomorrow and pt states her granddaughter knows which meds pt takes, no recent refills on Coreg  since 05/2023 -ascites search ultrasound ordered, consult procedure team if needed, pt currently with no abd pain -ceftriaxone  and octreotide  as above  #Type 2 diabetes Recent A1c values 3.7% in 11/2023, 6.7% in 05/2023. Pt takes Ozempic  every Tuesday.  -monitor glucose on serum labs for now -start SSI if needed   #Chronic low back pain #Age indeterminate T6 and T8 compression fractures 06/15/2023 Pt takes Percocet 10-325 mg every 4 hours PRN, 120 tabs every 30 days -will start with oxycodone  10 mg every 6 hours PRN and  PRN Tylenol  separately  Comorbid Conditions: Nutritional Disorders:    Hypoalbuminemia:  Hypoalbuminemia present with lowest albumin of 2.6.   Hypoalbuminemia is associated with increased risk for patients.  We will attempt to treat the underlying condition(s) contributing to this low albumin state. Electrolyte Disorders:    Hypokalemia:  Hypokalemia present with lowest potassium of 3.2.  Patient has potassium supplement ordered.  Will continue to monitor.     Hypocalcemia:  Hypocalcemia present with lowest calcium  of 7.9.  Will continue to monitor.   Hematologic Disorders:    Anemia:  Anemia present with lowest hemoglobin of 6.4.  Will  continue to monitor.     Thrombocytopenia:  Thrombocytopenia present with lowest platelet count of 103.  Will continue to monitor and assess for bleeding complications.       VTE Prophylaxis (Last Assessment on 11/11 at  1:14 AM)  + Anti-Embolism Compression Device Ordered  X Anticoagulant Not Ordered due to Pending Procedure    Code Status: DNAR pt states she would only want CPR if she was able to return to normal after CPR, we discussed that ~10-12% of patients who have in-hospital arrest needing CPR have good outcomes after CPR. Pt states her wishes are to be DNAR and her daughter Luke Myron is her main emergency contact.  Patient Class & Status: Inpatient,   Discharge Planning: PT/OT not yet consulted   Dickey Fruits, MD DICKEY FRUITS, MD  [facility] 02/05/2024 11:24 PM  Addendum: rectal exam with dark green/dark brown stool, no signs of melena, rectal exam chaperoned with Dr. Isac.

## 2024-02-05 NOTE — Progress Notes (Signed)
 Patients port flushed without difficulty.  Good blood return noted with no bruising or swelling noted at site.  Labs drawn per orders.  VSS with discharge and left in satisfactory condition with no s/s of distress noted. All follow ups as scheduled.       Kierre Deines

## 2024-02-05 NOTE — Progress Notes (Signed)
 Labs today show worsening anemia with Hgb 6.2 (despite receiving 1 unit PRBC last week).  Patient continues to report melanotic stool.  Discussed with gastroenterologist (Dr. Eartha), who recommends ED visit.  (There has been some delay in getting outpatient EGD, as patient was no-show for her last GI visit, and needs to obtain cardiac clearance prior to endoscopies.) Per Dr. Eartha, recommended [place] ED, as they may be better suited to complete her case safely.  Discussed recommendations with patient via telephone. She agrees to go to emergency department, but reports that she will go to [facility] instead, since they handled her previous GI bleed.  She stated that she might wait until tomorrow morning.  Patient informed that she needs to proceed to ED today and that she should not wait until tomorrow.  Pleasant Savannah Burns [place], PA-C 02/05/24 12:12 PM

## 2024-02-06 ENCOUNTER — Inpatient Hospital Stay

## 2024-02-06 ENCOUNTER — Inpatient Hospital Stay: Payer: Self-pay | Admitting: Family Medicine

## 2024-02-06 NOTE — Care Plan (Signed)
 ADMISSION NOTE: Patient arrived to the unit A/O x4 and VSS. Patient's belongings remain at bedside. Dual skin assessment performed. Patient oriented to room and call bell, and visitor policy. Falls precautions initiated. Patient password declined at this time. Patient remains on 3L O2 via [place].  Problem: Alteration in Hemodynamic Stability: Goal: Ability to maintain absence of bleeding will  improve. Outcome: Progressing   Problem: Safety Goal: Free from accidental physical injury Outcome: Progressing   Problem: Respiratory: Goal: Respiratory status will improve by discharge. Outcome: Progressing

## 2024-02-08 ENCOUNTER — Inpatient Hospital Stay

## 2024-02-08 ENCOUNTER — Telehealth: Payer: Self-pay | Admitting: Family Medicine

## 2024-02-08 NOTE — Telephone Encounter (Unsigned)
 Copied from CRM [id number]. Topic: Appointments - Scheduling Inquiry for Clinic >> Feb 08, 2024  4:30 PM Graeme ORN wrote: Reason for CRM: [facility] Nurse with [facility] called to leave a message for Dr Jolinda called. Wanted to leave a message the patient was admitted to [facility] and is expected to be discharged tomorrow. May need follow up appt. Phone: [phone]   ----------------------------------------------------------------------- From previous Reason for Contact - Medical Advice: Reason for CRM: [facility] Nurse with [facility] called to leave a message for Dr Veola

## 2024-02-09 LAB — TYPE AND SCREEN
ABO/RH(D): A POS
Antibody Screen: POSITIVE
Donor AG Type: NEGATIVE
Donor AG Type: NEGATIVE
Unit division: 0
Unit division: 0

## 2024-02-09 LAB — BPAM RBC
ISSUE DATE / TIME: 202510301717
Unit Type and Rh: 202511302359
Unit Type and Rh: 6200

## 2024-02-12 ENCOUNTER — Telehealth: Payer: Self-pay | Admitting: *Deleted

## 2024-02-12 NOTE — Transitions of Care (Post Inpatient/ED Visit) (Signed)
   02/12/2024  Name: Savannah Burns MRN: [id number] DOB: 07-Jun-1950  Today's TOC FU Call Status: Today's TOC FU Call Status:: Unsuccessful Call (1st Attempt) Unsuccessful Call (1st Attempt) Date: 02/12/24  Attempted to reach the patient regarding the most recent Inpatient/ED visit.  Follow Up Plan: Additional outreach attempts will be made to reach the patient to complete the Transitions of Care (Post Inpatient/ED visit) call.   Mliss Creed [facility], BSN RN Care Manager/ Transition of Care [place]/ [facility] [phone]

## 2024-02-12 NOTE — Transitions of Care (Post Inpatient/ED Visit) (Signed)
   02/12/2024  Name: Savannah Burns MRN: [id number] DOB: 1950/06/15  Today's TOC FU Call Status: Today's TOC FU Call Status:: Unsuccessful Call (2nd Attempt) Unsuccessful Call (2nd Attempt) Date: 02/12/24  Attempted to reach the patient regarding the most recent Inpatient/ED visit.  Follow Up Plan: Additional outreach attempts will be made to reach the patient to complete the Transitions of Care (Post Inpatient/ED visit) call.   Mliss Creed [facility], BSN RN Care Manager/ Transition of Care [place]/ [facility] [phone]

## 2024-02-13 ENCOUNTER — Telehealth: Payer: Self-pay | Admitting: *Deleted

## 2024-02-13 ENCOUNTER — Inpatient Hospital Stay

## 2024-02-13 VITALS — BP 118/81 | HR 89 | Temp 97.8°F | Resp 18

## 2024-02-13 DIAGNOSIS — D500 Iron deficiency anemia secondary to blood loss (chronic): Secondary | ICD-10-CM

## 2024-02-13 DIAGNOSIS — C9000 Multiple myeloma not having achieved remission: Secondary | ICD-10-CM

## 2024-02-13 DIAGNOSIS — Z5112 Encounter for antineoplastic immunotherapy: Secondary | ICD-10-CM | POA: Diagnosis not present

## 2024-02-13 LAB — IRON AND TIBC
Iron: 143 ug/dL (ref 28–170)
Saturation Ratios: 38 % — ABNORMAL HIGH (ref 10.4–31.8)
TIBC: 378 ug/dL (ref 250–450)
UIBC: 235 ug/dL

## 2024-02-13 LAB — CBC WITH DIFFERENTIAL/PLATELET
Abs Immature Granulocytes: 0 K/uL (ref 0.00–0.07)
Basophils Absolute: 0 K/uL (ref 0.0–0.1)
Basophils Relative: 0 %
Eosinophils Absolute: 0 K/uL (ref 0.0–0.5)
Eosinophils Relative: 1 %
HCT: 31.8 % — ABNORMAL LOW (ref 36.0–46.0)
Hemoglobin: 9.1 g/dL — ABNORMAL LOW (ref 12.0–15.0)
Immature Granulocytes: 0 %
Lymphocytes Relative: 35 %
Lymphs Abs: 1.4 K/uL (ref 0.7–4.0)
MCH: 28.4 pg (ref 26.0–34.0)
MCHC: 28.6 g/dL — ABNORMAL LOW (ref 30.0–36.0)
MCV: 99.4 fL (ref 80.0–100.0)
Monocytes Absolute: 0.5 K/uL (ref 0.1–1.0)
Monocytes Relative: 11 %
Neutro Abs: 2.1 K/uL (ref 1.7–7.7)
Neutrophils Relative %: 53 %
Platelets: 112 K/uL — ABNORMAL LOW (ref 150–400)
RBC: 3.2 MIL/uL — ABNORMAL LOW (ref 3.87–5.11)
RDW: 17 % — ABNORMAL HIGH (ref 11.5–15.5)
WBC: 4 K/uL (ref 4.0–10.5)
nRBC: 0 % (ref 0.0–0.2)

## 2024-02-13 LAB — SAMPLE TO BLOOD BANK

## 2024-02-13 LAB — FERRITIN: Ferritin: 907 ng/mL — ABNORMAL HIGH (ref 11–307)

## 2024-02-13 MED ORDER — IRON SUCROSE 500 MG IVPB - SIMPLE MED
500.0000 mg | Freq: Once | INTRAVENOUS | Status: AC
  Administered 2024-02-13: 500 mg via INTRAVENOUS
  Filled 2024-02-13: qty 500

## 2024-02-13 MED ORDER — SODIUM CHLORIDE 0.9 % IV SOLN: Freq: Once | INTRAVENOUS | Status: AC

## 2024-02-13 MED ORDER — CETIRIZINE HCL 10 MG PO TABS
10.0000 mg | ORAL_TABLET | Freq: Once | ORAL | Status: AC
  Administered 2024-02-13: 10 mg via ORAL
  Filled 2024-02-13: qty 1

## 2024-02-13 NOTE — Transitions of Care (Post Inpatient/ED Visit) (Signed)
   02/13/2024  Name: Savannah Burns MRN: [id number] DOB: 11/14/1950  Today's TOC FU Call Status: Today's TOC FU Call Status:: Unsuccessful Call (3rd Attempt) Unsuccessful Call (3rd Attempt) Date: 02/13/24  Attempted to reach the patient regarding the most recent Inpatient/ED visit.  Follow Up Plan: No further outreach attempts will be made at this time. We have been unable to contact the patient.  Mliss Creed [facility], BSN RN Care Manager/ Transition of Care [place]/ [name] [phone]

## 2024-02-13 NOTE — Progress Notes (Signed)
 [facility]: Post Discharge Outcomes Assessment   02/13/2024 2:10 PM   Two or more separate outreach attempts have been made to this patient within two business days and have been documented in the medical record. Patient has Medicare, Medicare Advantage, [employer]na, [employer] or [employer]. Therefore, this patient is TCM eligible.    Devere FELIX Shona, BSN, RN, CCM [employer] [facility] [employer] Management Office Phone    [phone]

## 2024-02-13 NOTE — Progress Notes (Signed)
 Patient tolerated iron infusion with no complaints voiced.  Port IV site clean and dry with good blood return noted before and after infusion.  Band aid applied.  VSS with discharge and left in satisfactory condition with no s/s of distress noted.

## 2024-02-13 NOTE — Patient Instructions (Signed)

## 2024-02-14 LAB — KAPPA/LAMBDA LIGHT CHAINS
Kappa free light chain: 11.8 mg/L (ref 3.3–19.4)
Kappa, lambda light chain ratio: 2 — ABNORMAL HIGH (ref 0.26–1.65)
Lambda free light chains: 5.9 mg/L (ref 5.7–26.3)

## 2024-02-14 NOTE — Progress Notes (Addendum)
 Subjective:   Savannah Burns is a 73 y.o. female who presents for a [employer] Visit.  I connected with  Savannah  G Burns on 01/29/24 by a audio enabled telemedicine application and verified that I am speaking with the correct person using two identifiers.  Patient Location: Home  Provider Location: Office/Clinic  Persons Participating in Visit: Patient.  I discussed the limitations of evaluation and management by telemedicine. The patient expressed understanding and agreed to proceed.   Vital Signs: Because this visit was a virtual/telehealth visit, some criteria may be missing or patient reported. Any vitals not documented were not able to be obtained and vitals that have been documented are patient reported.     I discussed the limitations of evaluation and management by telemedicine. The patient expressed understanding and agreed to proceed.   Vital Signs: Because this visit was a virtual/telehealth visit, some criteria may be missing or patient reported. Any vitals not documented were not able to be obtained and vitals that have been documented are patient reported.     Allergies (verified) Keflex  [cephalexin ]   History: Past Medical History:  Diagnosis Date   Adrenal adenoma, left    Stable   Anxiety    Arthritis    bilateral hands   COPD (chronic obstructive pulmonary disease) (HCC)    Depression    Diabetes mellitus, type 2 (HCC) 08/12/2008   Qualifier: Diagnosis of  By: Jenetta MD, Talia     Dyspnea    Esophageal varices (HCC)    Grade II diastolic dysfunction    History of kidney stones    Hyperlipidemia    Hypertension    Lower back pain    Lower GI bleed 03/19/2020   Panic attacks    Pneumonia    currently taking antibiotic and prednisone  for early stages of pneumonia   Pulmonary nodules    bilateral   Skin cancer    face   Past Surgical History:  Procedure Laterality Date   BIOPSY  04/07/2020   Procedure: BIOPSY;  Surgeon:  Cindie Carlin POUR, DO;  [facility];  Service: Endoscopy;;   Breast Cystectomy  Right    CESAREAN SECTION     COLONOSCOPY WITH PROPOFOL  N/A 01/25/2020   Dr. Cindie: Nonbleeding internal hemorrhoids, diverticulosis, 5 mm polyp removed from the ascending colon, 10 mm polyp removed from the sigmoid colon, 30 mm polyp (tubulovillous adenoma with no high-grade dysplasia) removed from the transverse colon via piecemeal status post tattoo.  Other polyps were tubular adenomas.  3 month surveillance colonoscopy recommended.   COLONOSCOPY WITH PROPOFOL  N/A 04/07/2020   Procedure: COLONOSCOPY WITH PROPOFOL ;  Surgeon: Cindie Carlin POUR, DO;  [facility];  Service: Endoscopy;  Laterality: N/A;  3:00pm, pt knows new time per office   CYSTOSCOPY/URETEROSCOPY/HOLMIUM LASER/STENT PLACEMENT Bilateral 03/01/2019   Procedure: CYSTOSCOPY/RETROGRADEURETEROSCOPY/HOLMIUM LASER/STENT PLACEMENT;  Surgeon: Devere Lonni Righter, MD;  [facility]: [facility];  Service: Urology;  Laterality: Bilateral;  ONLY NEEDS 60 MIN   ESOPHAGOGASTRODUODENOSCOPY (EGD) WITH PROPOFOL  N/A 01/25/2020   Dr. Cindie: 4 columns grade 1 esophageal varices   ESOPHAGOGASTRODUODENOSCOPY (EGD) WITH PROPOFOL  N/A 05/18/2020   Procedure: ESOPHAGOGASTRODUODENOSCOPY (EGD) WITH PROPOFOL ;  Surgeon: Cindie Carlin POUR, DO;  [facility];  Service: Endoscopy;  Laterality: N/A;   ESOPHAGOGASTRODUODENOSCOPY (EGD) WITH PROPOFOL  N/A 07/28/2020   Procedure: ESOPHAGOGASTRODUODENOSCOPY (EGD) WITH PROPOFOL ;  Surgeon: Cindie Carlin POUR, DO;  [facility];  Service: Endoscopy;  Laterality: N/A;  am or early PM  due to givens capsule placement   GIVENS CAPSULE STUDY N/A 05/18/2020   Procedure: GIVENS CAPSULE STUDY;  Surgeon: Eartha Angelia Sieving, MD;  [facility];  Service: Gastroenterology;  Laterality: N/A;   GIVENS CAPSULE STUDY N/A 07/28/2020   Procedure: GIVENS CAPSULE STUDY;  Surgeon: Cindie Carlin POUR, DO;   [facility];  Service: Endoscopy;  Laterality: N/A;   IR IMAGING GUIDED PORT INSERTION  12/02/2021   IRRIGATION AND DEBRIDEMENT ABSCESS Right 09/28/2022   Procedure: IRRIGATION AND DEBRIDEMENT ABSCESS;  Surgeon: Evonnie Dorothyann LABOR, DO;  Location: [facility];  Service: General;  Laterality: Right;   POLYPECTOMY  01/25/2020   Procedure: POLYPECTOMY;  Surgeon: Cindie Carlin POUR, DO;  [facility];  Service: Endoscopy;;   POLYPECTOMY  04/07/2020   Procedure: POLYPECTOMY INTESTINAL;  Surgeon: Cindie Carlin POUR, DO;  [facility];  Service: Endoscopy;;   PORT-A-CATH REMOVAL Right 09/28/2022   Procedure: MINOR REMOVAL PORT-A-CATH;  Surgeon: Evonnie Dorothyann LABOR, DO;  Location: [facility];  Service: General;  Laterality: Right;   PORTACATH PLACEMENT Right 04/12/2023   Procedure: INSERTION PORT-A-CATH, RIJ;  Surgeon: Evonnie Dorothyann LABOR, DO;  Location[facility];  Service: General;  Laterality: Right;   SKIN CANCER EXCISION     Face   SPINE SURGERY     SUBMUCOSAL TATTOO INJECTION  01/25/2020   Procedure: SUBMUCOSAL TATTOO INJECTION;  Surgeon: Cindie Carlin POUR, DO;  [facility];  Service: Endoscopy;;   Family History  Problem Relation Age of Onset   Diabetes Father    Heart disease Father 46       MI   Hypertension Father    Anemia Mother        Transfusion dependent   COPD Sister    Cancer Paternal Grandmother 58       Pancreatic   Social History   Occupational History   Occupation: Retired   Tobacco Use   Smoking status: Former    Current packs/day: 0.00    Average packs/day: 1.5 packs/day for 40.0 years (60.0 ttl pk-yrs)    Types: Cigarettes    Start date: 04/29/1975    Quit date: 04/29/2015    Years since quitting: 8.8   Smokeless tobacco: Never   Tobacco comments:    Quit smoking 04/2015- Previous 1.5 ppd smoker  Vaping Use   Vaping status: Never Used  Substance and Sexual Activity   Alcohol  use: No    Alcohol /week: 0.0 standard drinks of  alcohol    Drug use: No   Sexual activity: Not Currently    Birth control/protection: Post-menopausal   Tobacco Counseling Counseling given: Yes Tobacco comments: Quit smoking 04/2015- Previous 1.5 ppd smoker  SDOH Screenings   Food Insecurity: No Food Insecurity (01/29/2024)  Housing: Unknown (02/07/2024)   [facility]  Transportation Needs: No Transportation Needs (02/07/2024)   [facility]  Utilities: Not At Risk (01/29/2024)  Alcohol  Screen: Low Risk  (01/27/2023)  Depression (PHQ2-9): Low Risk  (02/13/2024)  Financial Resource Strain: Low Risk  (06/12/2023)  Physical Activity: Inactive (01/29/2024)  Social Connections: Socially Isolated (01/29/2024)  Stress: No Stress Concern Present (01/29/2024)  Tobacco Use: Medium Risk (01/31/2024)  Health Literacy: Adequate Health Literacy (01/29/2024)   Depression Screen    02/13/2024    8:47 AM 01/30/2024   10:30 AM 01/29/2024    1:33 PM 01/16/2024    9:18 AM 01/02/2024    3:44 PM 01/02/2024   10:47 AM 11/21/2023  8:28 AM  PHQ 2/9 Scores  PHQ - 2 Score 0 0 0 0 0 0 0     Goals Addressed             This Visit's Progress    Exercise 150 minutes per week (moderate activity)   On track      Visit info / Clinical Intake: Medicare Wellness Visit Type:: Subsequent Annual Wellness Visit Medicare Wellness Visit Mode:: Telephone If telephone:: video declined Because this visit was a virtual/telehealth visit:: vitals recorded from last visit Interpreter Needed?: No Pre-visit prep was completed: yes AWV questionnaire completed by patient prior to visit?: no Living arrangements:: with family/others Patient's Overall Health Status Rating: very good Typical amount of pain: none Does pain affect daily life?: no Are you currently prescribed opioids?: no  Dietary Habits and Nutritional Risks How many meals a day?: 2 Eats fruit and vegetables daily?: yes Most meals are  obtained by: preparing own meals Diabetic:: (!) yes Any non-healing wounds?: no How often do you check your BS?: 2 Would you like to be referred to a Nutritionist or for Diabetic Management? : no  Functional Status Activities of Daily Living (to include ambulation/medication): (!) Needs Assist Feeding: Independent Dressing/Grooming: Independent Bathing: Needs assistance Toileting: Independent Transfer: Independent Ambulation: Independent with device- listed below (walker) Medication Administration: Needs assistance (comment) (pt's grand daughter) Home Management: Needs assistance (comment) (pt's grand daughter) Manage your own finances?: yes Primary transportation is: family/friends Concerns about hearing?: no (have some hearing issues)  Fall Screening Falls in the past year?: 1 Number of falls in past year: 0 Was there an injury with Fall?: 0 Fall Risk Category Calculator: 1 Patient Fall Risk Level: High Fall Risk  Fall Risk Patient at Risk for Falls Due to: Impaired balance/gait; Impaired mobility Fall risk Follow up: Falls evaluation completed; Education provided  Home and Transportation Safety: All rugs have non-skid backing?: yes All stairs or steps have railings?: (!) no Grab bars in the bathtub or shower?: yes (none in shower) Have non-skid surface in bathtub or shower?: yes Good home lighting?: yes Regular seat belt use?: (!) no Hospital stays in the last year:: (!) yes How many hospital stays:: 2 Reason: for RSV/pnuemonia, COVID  2025  Cognitive Assessment Difficulty concentrating, remembering, or making decisions? : no Will 6CIT or Mini Cog be Completed: yes What year is it?: 0 points What month is it?: 0 points Give patient an address phrase to remember (5 components): [address] [place], [place] About what time is it?: 0 points Count backwards from 20 to 1: 0 points Say the months of the year in reverse: 0 points Repeat the address phrase from earlier: 0  points 6 CIT Score: 0 points  Advance Directives (For Healthcare) Does Patient Have a Medical Advance Directive?: No Does patient want to make changes to medical advance directive?: No - Patient declined Type of Advance Directive: Healthcare Power of [place]; Living will Copy of Healthcare Power of Attorney in Chart?: No - copy requested Copy of Living Will in Chart?: No - copy requested Would patient like information on creating a medical advance directive?: No - Patient declined  Reviewed/Updated  Reviewed/Updated: All; Medical History; Surgical History; Family History; Medications; Allergies; Care Teams; Patient Goals        Objective:    Today's Vitals   01/29/24 1342  BP: 139/60  Pulse: 76  Weight: 280 lb (127 kg)  Height: 5' 2 (1.575 m)   Body mass index is 51.21 kg/m.  Current Medications (verified) Outpatient Encounter Medications as of 01/29/2024  Medication Sig   acyclovir  (ZOVIRAX ) 400 MG tablet Take 1 tablet (400 mg total) by mouth 2 (two) times daily.   albuterol  (PROVENTIL ) (2.5 MG/3ML) 0.083% nebulizer solution Take 3 mLs (2.5 mg total) by nebulization every 4 (four) hours as needed for wheezing or shortness of breath.   albuterol  (VENTOLIN  HFA) 108 (90 Base) MCG/ACT inhaler Inhale 2 puffs into the lungs every 6 (six) hours as needed for wheezing or shortness of breath.   desvenlafaxine  (PRISTIQ ) 100 MG 24 hr tablet Take 100 mg by mouth in the morning.   ferrous sulfate  325 (65 FE) MG tablet Take 1 tablet (325 mg total) by mouth daily with breakfast.   fluticasone -salmeterol (ADVAIR) 100-50 MCG/ACT AEPB Inhale 1 puff into the lungs 2 (two) times daily.   folic acid  (FOLVITE ) 1 MG tablet Take 1 tablet (1 mg total) by mouth daily.   lidocaine -prilocaine  (EMLA ) cream Apply 1 Application topically as needed. Apply a small amount to port a cath site and cover with plastic wrap 1 hour prior to infusion appointments   lovastatin  (MEVACOR ) 20 MG tablet Take 1 tablet  (20 mg total) by mouth at bedtime.   metolazone  (ZAROXOLYN ) 2.5 MG tablet Take 1 tablet (2.5 mg total) by mouth daily as needed.   nystatin  (MYCOSTATIN /NYSTOP ) powder Apply 1 Application topically 2 (two) times daily.   oxyCODONE -acetaminophen  (PERCOCET) 10-325 MG tablet Take 1 tablet by mouth 4 (four) times daily as needed for pain.   OXYGEN  Inhale 5 L into the lungs continuous. (Patient taking differently: Inhale 3 L into the lungs continuous.)   pantoprazole  (PROTONIX ) 40 MG tablet Take 1 tablet (40 mg total) by mouth 2 (two) times daily.   potassium chloride  SA (KLOR-CON  M) 20 MEQ tablet Take 1 tablet (20 mEq total) by mouth 2 (two) times daily.   [DISCONTINUED] bumetanide  (BUMEX ) 1 MG tablet Take 2 tablets (2 mg total) by mouth daily.   No facility-administered encounter medications on file as of 01/29/2024.   Hearing/Vision screen Hearing Screening - Comments:: Pt have hearing dif Vision Screening - Comments:: Pt wear glasses for reading/last ov was years ago ie 20 Immunizations and Health Maintenance Health Maintenance  Topic Date Due   Zoster Vaccines- Shingrix (1 of 2) 09[birth year]/1968   Diabetic kidney evaluation - Urine ACR  03/01/2018   COVID-19 Vaccine (2 - Moderna risk series) 07/10/2019   OPHTHALMOLOGY EXAM  08/06/2021   FOOT EXAM  09/04/2021   Influenza Vaccine  03/27/2024 (Originally 10/27/2023)   Mammogram  05/14/2024 (Originally 09/12/1989)   DEXA SCAN  05/14/2024 (Originally 09/13/2014)   Lung Cancer Screening  06/14/2024   HEMOGLOBIN A1C  06/23/2024   Medicare Annual Wellness (AWV)  01/28/2025   Diabetic kidney evaluation - eGFR measurement  01/29/2025   Colonoscopy  04/07/2030   DTaP/Tdap/Td (4 - Td or Tdap) 05/23/2032   Pneumococcal Vaccine: 50+ Years  Completed   Hepatitis C Screening  Completed   Meningococcal B Vaccine  Aged Out   Hepatitis B Vaccines 19-59 Average Risk  Discontinued        Assessment/Plan:  This is a routine wellness examination for  Dalaney .  Patient Care Team: Jolinda Norene HERO, DO as PCP - General (Family Medicine) Alvan Dorn FALCON, MD as PCP - Cardiology (Cardiology) Darlean Ned, MD (Rehabilitation) Shellia Oh, MD as Consulting Physician (Pulmonary Disease) Cindie Carlin POUR, DO as Consulting Physician (Internal Medicine) Grossman, Janelle, NP as Nurse Practitioner (Nurse Practitioner) Gaile Frederick, MD  as Referring Physician (Gastroenterology) Celestia Joesph SQUIBB, RN as Oncology Nurse Navigator (Medical Oncology) Eartha Flavors, Toribio, MD as Consulting Physician (Gastroenterology) Darlean Ozell NOVAK, MD as Consulting Physician (Pulmonary Disease)  I have personally reviewed and noted the following in the patient's chart:   Medical and social history Use of alcohol , tobacco or illicit drugs  Current medications and supplements including opioid prescriptions. Functional ability and status Nutritional status Physical activity Advanced directives List of other physicians Hospitalizations, surgeries, and ER visits in previous 12 months Vitals Screenings to include cognitive, depression, and falls Referrals and appointments  No orders of the defined types were placed in this encounter.  In addition, I have reviewed and discussed with patient certain preventive protocols, quality metrics, and best practice recommendations. A written personalized care plan for preventive services as well as general preventive health recommendations were provided to patient.   Ozie Ned, CMA   02/14/2024   Return in 1 year (on 01/28/2025).  After Visit Summary: (MyChart) Due to this being a telephonic visit, the after visit summary with patients personalized plan was offered to patient via MyChart   Nurse Notes: pt is aware and due the following: Diabetic eye--per pt can't afford vision coverage, Urine ACR, Shingles vaccine, foot exam is due, Lung cancer screening is due

## 2024-02-15 ENCOUNTER — Inpatient Hospital Stay: Admitting: Family

## 2024-02-15 ENCOUNTER — Inpatient Hospital Stay

## 2024-02-15 LAB — MULTIPLE MYELOMA PANEL, SERUM
Albumin SerPl Elph-Mcnc: 2.9 g/dL (ref 2.9–4.4)
Albumin/Glob SerPl: 1.4 (ref 0.7–1.7)
Alpha 1: 0.2 g/dL (ref 0.0–0.4)
Alpha2 Glob SerPl Elph-Mcnc: 0.6 g/dL (ref 0.4–1.0)
B-Globulin SerPl Elph-Mcnc: 0.8 g/dL (ref 0.7–1.3)
Gamma Glob SerPl Elph-Mcnc: 0.4 g/dL (ref 0.4–1.8)
Globulin, Total: 2.1 g/dL — ABNORMAL LOW (ref 2.2–3.9)
IgA: 52 mg/dL — ABNORMAL LOW (ref 64–422)
IgG (Immunoglobin G), Serum: 481 mg/dL — ABNORMAL LOW (ref 586–1602)
IgM (Immunoglobulin M), Srm: 12 mg/dL — ABNORMAL LOW (ref 26–217)
M Protein SerPl Elph-Mcnc: 0.2 g/dL — ABNORMAL HIGH
Total Protein ELP: 5 g/dL — ABNORMAL LOW (ref 6.0–8.5)

## 2024-02-19 ENCOUNTER — Inpatient Hospital Stay

## 2024-02-20 ENCOUNTER — Other Ambulatory Visit: Payer: Self-pay | Admitting: Oncology

## 2024-02-20 ENCOUNTER — Telehealth: Payer: Self-pay

## 2024-02-20 DIAGNOSIS — C9000 Multiple myeloma not having achieved remission: Secondary | ICD-10-CM

## 2024-02-20 NOTE — Telephone Encounter (Signed)
 Spoke with Olam, she states they have tried several times to get in contact with the patient and have been unsuccessful so they will not be admitting her. Informed Olam I will reach out to patient to see if she will answer me.   Was able to get in touch with patient, she states she was getting her floor redone right now but will contact [facility] once it is done. Provider pt with number and advised to call us  if she has any other questions.

## 2024-02-20 NOTE — Telephone Encounter (Signed)
 Copied from CRM [id number]. Topic: General - Other >> Feb 20, 2024 12:39 PM Santiya F wrote: Reason for CRM: Olam with [facility] is calling in because they received a referral from [facility] for Nursing and Physical Therapy visits. She has not been able to reach the patient on either of her contact numbers and wanted to let her provider know they would not be admitting the patient. She says they can try at a later time once the provider meets with Olam can be reached at [phone]

## 2024-02-21 ENCOUNTER — Inpatient Hospital Stay

## 2024-02-21 VITALS — BP 129/57 | HR 80 | Resp 18

## 2024-02-21 DIAGNOSIS — D500 Iron deficiency anemia secondary to blood loss (chronic): Secondary | ICD-10-CM

## 2024-02-21 DIAGNOSIS — C9000 Multiple myeloma not having achieved remission: Secondary | ICD-10-CM

## 2024-02-21 DIAGNOSIS — Z5112 Encounter for antineoplastic immunotherapy: Secondary | ICD-10-CM | POA: Diagnosis not present

## 2024-02-21 LAB — CBC WITH DIFFERENTIAL/PLATELET
Abs Immature Granulocytes: 0.01 K/uL (ref 0.00–0.07)
Basophils Absolute: 0 K/uL (ref 0.0–0.1)
Basophils Relative: 0 %
Eosinophils Absolute: 0 K/uL (ref 0.0–0.5)
Eosinophils Relative: 1 %
HCT: 35.6 % — ABNORMAL LOW (ref 36.0–46.0)
Hemoglobin: 10.3 g/dL — ABNORMAL LOW (ref 12.0–15.0)
Immature Granulocytes: 0 %
Lymphocytes Relative: 29 %
Lymphs Abs: 1.1 K/uL (ref 0.7–4.0)
MCH: 28.9 pg (ref 26.0–34.0)
MCHC: 28.9 g/dL — ABNORMAL LOW (ref 30.0–36.0)
MCV: 100 fL (ref 80.0–100.0)
Monocytes Absolute: 0.4 K/uL (ref 0.1–1.0)
Monocytes Relative: 11 %
Neutro Abs: 2.2 K/uL (ref 1.7–7.7)
Neutrophils Relative %: 59 %
Platelets: 109 K/uL — ABNORMAL LOW (ref 150–400)
RBC: 3.56 MIL/uL — ABNORMAL LOW (ref 3.87–5.11)
RDW: 16.7 % — ABNORMAL HIGH (ref 11.5–15.5)
WBC: 3.8 K/uL — ABNORMAL LOW (ref 4.0–10.5)
nRBC: 0 % (ref 0.0–0.2)

## 2024-02-21 LAB — SAMPLE TO BLOOD BANK

## 2024-02-21 NOTE — Progress Notes (Signed)
 Patients port flushed without difficulty.  Good blood return noted with no bruising or swelling noted at site.  Band aid applied.  VSS with discharge and left in satisfactory condition with no s/s of distress noted.

## 2024-02-21 NOTE — Patient Instructions (Signed)
 [facility] [place] - A DEPT OF [facility]  Discharge Instructions: Thank you for choosing [facility] to provide your oncology and hematology care.  If you have a lab appointment with the Cancer Center - please note that after April 8th, 2024, all labs will be drawn in the cancer center.  You do not have to check in or register with the main entrance as you have in the past but will complete your check-in in the cancer center.  Wear comfortable clothing and clothing appropriate for easy access to any Portacath or PICC line.   We strive to give you quality time with your provider. You may need to reschedule your appointment if you arrive late (15 or more minutes).  Arriving late affects you and other patients whose appointments are after yours.  Also, if you miss three or more appointments without notifying the office, you may be dismissed from the clinic at the provider's discretion.      For prescription refill requests, have your pharmacy contact our office and allow 72 hours for refills to be completed.    Today you received the following chemotherapy and/or immunotherapy agents Port flush      To help prevent nausea and vomiting after your treatment, we encourage you to take your nausea medication as directed.  BELOW ARE SYMPTOMS THAT SHOULD BE REPORTED IMMEDIATELY: *FEVER GREATER THAN 100.4 F (38 C) OR HIGHER *CHILLS OR SWEATING *NAUSEA AND VOMITING THAT IS NOT CONTROLLED WITH YOUR NAUSEA MEDICATION *UNUSUAL SHORTNESS OF BREATH *UNUSUAL BRUISING OR BLEEDING *URINARY PROBLEMS (pain or burning when urinating, or frequent urination) *BOWEL PROBLEMS (unusual diarrhea, constipation, pain near the anus) TENDERNESS IN MOUTH AND THROAT WITH OR WITHOUT PRESENCE OF ULCERS (sore throat, sores in mouth, or a toothache) UNUSUAL RASH, SWELLING OR PAIN  UNUSUAL VAGINAL DISCHARGE OR ITCHING   Items with * indicate a potential emergency and should be followed  up as soon as possible or go to the Emergency Department if any problems should occur.  Please show the CHEMOTHERAPY ALERT CARD or IMMUNOTHERAPY ALERT CARD at check-in to the Emergency Department and triage nurse.  Should you have questions after your visit or need to cancel or reschedule your appointment, please contact [facility] [place] - A DEPT OF [facility] [phone]  and follow the prompts.  Office hours are 8:00 a.m. to 4:30 p.m. Monday - Friday. Please note that voicemails left after 4:00 p.m. may not be returned until the following business day.  We are closed weekends and major holidays. You have access to a nurse at all times for urgent questions. Please call the main number to the clinic [phone] and follow the prompts.  For any non-urgent questions, you may also contact your provider using MyChart. We now offer e-Visits for anyone 86 and older to request care online for non-urgent symptoms. For details visit [email or web address].   Also download the MyChart app! Go to the app store, search "MyChart", open the app, select [place], and log in with your MyChart username and password.

## 2024-02-26 ENCOUNTER — Ambulatory Visit (INDEPENDENT_AMBULATORY_CARE_PROVIDER_SITE_OTHER): Admitting: Gastroenterology

## 2024-02-27 ENCOUNTER — Inpatient Hospital Stay

## 2024-02-27 ENCOUNTER — Inpatient Hospital Stay: Attending: Hematology | Admitting: Oncology

## 2024-02-27 VITALS — Wt 272.1 lb

## 2024-02-27 DIAGNOSIS — Z9889 Other specified postprocedural states: Secondary | ICD-10-CM | POA: Insufficient documentation

## 2024-02-27 DIAGNOSIS — E538 Deficiency of other specified B group vitamins: Secondary | ICD-10-CM | POA: Insufficient documentation

## 2024-02-27 DIAGNOSIS — Z5112 Encounter for antineoplastic immunotherapy: Secondary | ICD-10-CM | POA: Diagnosis present

## 2024-02-27 DIAGNOSIS — D509 Iron deficiency anemia, unspecified: Secondary | ICD-10-CM | POA: Insufficient documentation

## 2024-02-27 DIAGNOSIS — Z79899 Other long term (current) drug therapy: Secondary | ICD-10-CM | POA: Insufficient documentation

## 2024-02-27 DIAGNOSIS — C9000 Multiple myeloma not having achieved remission: Secondary | ICD-10-CM | POA: Insufficient documentation

## 2024-02-27 DIAGNOSIS — Z8719 Personal history of other diseases of the digestive system: Secondary | ICD-10-CM | POA: Insufficient documentation

## 2024-02-27 DIAGNOSIS — D61818 Other pancytopenia: Secondary | ICD-10-CM | POA: Diagnosis not present

## 2024-02-27 DIAGNOSIS — D500 Iron deficiency anemia secondary to blood loss (chronic): Secondary | ICD-10-CM

## 2024-02-27 DIAGNOSIS — K7460 Unspecified cirrhosis of liver: Secondary | ICD-10-CM | POA: Insufficient documentation

## 2024-02-27 DIAGNOSIS — Z7961 Long term (current) use of immunomodulator: Secondary | ICD-10-CM | POA: Diagnosis not present

## 2024-02-27 LAB — COMPREHENSIVE METABOLIC PANEL WITH GFR
ALT: 11 U/L (ref 0–44)
AST: 19 U/L (ref 15–41)
Albumin: 3.8 g/dL (ref 3.5–5.0)
Alkaline Phosphatase: 113 U/L (ref 38–126)
Anion gap: 13 (ref 5–15)
BUN: 12 mg/dL (ref 8–23)
CO2: 29 mmol/L (ref 22–32)
Calcium: 8.6 mg/dL — ABNORMAL LOW (ref 8.9–10.3)
Chloride: 99 mmol/L (ref 98–111)
Creatinine, Ser: 0.61 mg/dL (ref 0.44–1.00)
GFR, Estimated: >60 mL/min (ref >60)
Glucose, Bld: 128 mg/dL — ABNORMAL HIGH (ref 70–99)
Potassium: 3.3 mmol/L — ABNORMAL LOW (ref 3.5–5.1)
Sodium: 142 mmol/L (ref 135–145)
Total Bilirubin: 0.4 mg/dL (ref 0.0–1.2)
Total Protein: 5.6 g/dL — ABNORMAL LOW (ref 6.5–8.1)

## 2024-02-27 LAB — CBC WITH DIFFERENTIAL/PLATELET
Abs Immature Granulocytes: 0 K/uL (ref 0.00–0.07)
Basophils Absolute: 0 K/uL (ref 0.0–0.1)
Basophils Relative: 0 %
Eosinophils Absolute: 0 K/uL (ref 0.0–0.5)
Eosinophils Relative: 1 %
HCT: 37 % (ref 36.0–46.0)
Hemoglobin: 11.2 g/dL — ABNORMAL LOW (ref 12.0–15.0)
Immature Granulocytes: 0 %
Lymphocytes Relative: 32 %
Lymphs Abs: 1.2 K/uL (ref 0.7–4.0)
MCH: 29.4 pg (ref 26.0–34.0)
MCHC: 30.3 g/dL (ref 30.0–36.0)
MCV: 97.1 fL (ref 80.0–100.0)
Monocytes Absolute: 0.5 K/uL (ref 0.1–1.0)
Monocytes Relative: 13 %
Neutro Abs: 2.1 K/uL (ref 1.7–7.7)
Neutrophils Relative %: 54 %
Platelets: 65 K/uL — ABNORMAL LOW (ref 150–400)
RBC: 3.81 MIL/uL — ABNORMAL LOW (ref 3.87–5.11)
RDW: 16.9 % — ABNORMAL HIGH (ref 11.5–15.5)
WBC: 3.9 K/uL — ABNORMAL LOW (ref 4.0–10.5)
nRBC: 0 % (ref 0.0–0.2)

## 2024-02-27 LAB — SAMPLE TO BLOOD BANK

## 2024-02-27 LAB — MAGNESIUM: Magnesium: 1.9 mg/dL (ref 1.7–2.4)

## 2024-02-27 MED ORDER — ACETAMINOPHEN 325 MG PO TABS
650.0000 mg | ORAL_TABLET | Freq: Once | ORAL | Status: AC
  Administered 2024-02-27: 650 mg via ORAL
  Filled 2024-02-27: qty 2

## 2024-02-27 MED ORDER — DARATUMUMAB-HYALURONIDASE-FIHJ 1800-30000 MG-UT/15ML ~~LOC~~ SOLN
1800.0000 mg | Freq: Once | SUBCUTANEOUS | Status: AC
  Administered 2024-02-27: 1800 mg via SUBCUTANEOUS
  Filled 2024-02-27: qty 15

## 2024-02-27 MED ORDER — DIPHENHYDRAMINE HCL 25 MG PO CAPS
25.0000 mg | ORAL_CAPSULE | Freq: Once | ORAL | Status: AC
  Administered 2024-02-27: 25 mg via ORAL
  Filled 2024-02-27: qty 1

## 2024-02-27 MED ORDER — DEXAMETHASONE 4 MG PO TABS
20.0000 mg | ORAL_TABLET | Freq: Once | ORAL | Status: AC
  Administered 2024-02-27: 20 mg via ORAL
  Filled 2024-02-27: qty 5

## 2024-02-27 MED ORDER — DEXAMETHASONE 4 MG PO TABS: 20.0000 mg | ORAL_TABLET | ORAL | 3 refills | Status: AC

## 2024-02-27 MED ORDER — CYANOCOBALAMIN 1000 MCG/ML IJ SOLN
1000.0000 ug | Freq: Once | INTRAMUSCULAR | Status: AC
  Administered 2024-02-27: 1000 ug via INTRAMUSCULAR
  Filled 2024-02-27: qty 1

## 2024-02-27 NOTE — Progress Notes (Signed)
 Patient Care Team: Jolinda Norene HERO, DO as PCP - General (Family Medicine) Alvan, Dorn FALCON, MD as PCP - Cardiology (Cardiology) Darlean Ned, MD (Rehabilitation) Shellia Oh, MD as Consulting Physician (Pulmonary Disease) Cindie Carlin POUR, DO as Consulting Physician (Internal Medicine) Edwena Parkin, NP as Nurse Practitioner (Nurse Practitioner) Gaile Frederick, MD as Referring Physician (Gastroenterology) Celestia Joesph SQUIBB, RN as Oncology Nurse Navigator (Medical Oncology) Eartha Flavors, Toribio, MD as Consulting Physician (Gastroenterology) Darlean Ozell NOVAK, MD as Consulting Physician (Pulmonary Disease)  Clinic Day:  02/27/2024  Referring physician: Jolinda Norene HERO, DO   CHIEF COMPLAINT:  CC: 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 female with multiple myeloma and iron  deficiency anemia.   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 Patient had worsening thrombocytopenia with a history of GI bleeding.  Held Revlimid  on 09/26/2023   - Patient is here for daratumumab  infusion today.  Tolerating well with no complaints.  Denies shortness of breath, upper respiratory tract infections. - Discussed with patient that we can start Revlimid  once her labs are more stable.  Can start at a lower dose of 5 mg daily 3 weeks on/1 week off probably if Hb is stable at next visit.  - Continue daratumumab  monthly at this time - Continue acyclovir  for prophylaxis - Reviewed labs today:CMP: Sodium: 142, potassium: 3.3, calcium : 8.6, normal creatinine, normal LFTs.  CBC: WBC: 3.9, hemoglobin improved to 11.2, platelets 65.  Recent myeloma labs from  02/13/2024: M spike: 0.2, free kappa light chain 11.8, free lambda light chain: 5.9, ratio: 2  Return to clinic in 1 month with labs  Iron  deficiency anemia due to chronic blood loss Patient has a history of iron  deficiency anemia likely secondary to GI AVMs S/p multiple IV iron  infusions -Push enteroscopy on 02/07/24 with gastritis s/p biopsy (negative for H pylori), gastric polyp, and small bowel angioectasias s/p APC, now with resolution of bleeding Recent iron  panel with TSAT of 38 and ferritin of 907   - No further IV iron  infusions at this time. - Will repeat iron  panel with next blood draw.  Vitamin B12 deficiency Patient had previous vitamin B12 deficiency and is currently on parenteral supplementation   - Continue parenteral vitamin B12 supplementation for now.  Continue oral supplementation - Will repeat it with next blood draw and discontinue if high.  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        The patient understands the plans discussed today and is in agreement with them.  She knows to contact our office if she develops concerns prior to her next appointment.  30 minutes of total time was spent for this patient encounter, including preparation,face-to-face counseling with the patient and coordination of care, physical exam, and documentation of the encounter.   LILLETTE Verneta SAUNDERS Teague,acting as a neurosurgeon for Mickiel Dry, MD.,have documented all relevant documentation on the behalf of Mickiel Dry, MD,as directed by  Mickiel Dry, MD while in the presence of Mickiel Dry, MD.  I, Mickiel Dry MD, have reviewed the above documentation for accuracy and completeness, and I agree with the above.    Leilanie Rauda  Davonna, MD  [facility] [place] - A DEPT OF [facility] [address] [place] [address] Dept: [phone] Dept Fax: [phone]   Orders  Placed This Encounter  Procedures   Kappa/lambda light chains    Standing Status:   Standing    Number of Occurrences:   22    Expiration Date:   02/26/2025   Ferritin    Standing Status:   Future    Expected Date:   03/26/2024    Expiration Date:   06/24/2024   Folate    Standing Status:   Future    Expected Date:   03/26/2024    Expiration Date:   06/24/2024   Vitamin B12    Standing Status:   Future    Expected Date:   03/26/2024    Expiration Date:   06/24/2024   Iron  and TIBC    Standing Status:   Future    Expected Date:   03/26/2024    Expiration Date:   06/24/2024   Multiple Myeloma Panel (SPEP&IFE w/QIG)    Standing Status:   Future    Expected Date:   03/26/2024    Expiration Date:   06/24/2024   Kappa/lambda light chains    Standing Status:   Future    Expected Date:   03/26/2024    Expiration Date:   06/24/2024     ONCOLOGY HISTORY:   Diagnosis: 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 M-spike 2.6. Gamma Globulin 3.0. Total Globulin 5.1. IgG 3727. FLC ratio 4.17 with elevated kappa light chains at 75.5.  -08/03/2020: PET whole body: Diffuse mild marrow activity in the appendicular skeleton is nonspecific. Query anemia marrow response. Consider bone marrow biopsy. No focal hypermetabolic activity in the skeleton. No soft tissue plasmacytoma.  No lytic lesion on CT portion exam. -08/04/2020: Bone marrow biopsy: Hypercellular bone marrow with plasma cell neoplasm. The plasma cells are increased in number representing 18% of all cells   - 07/16/2021: Skeletal survey: Faint lucencies noted throughout the skull with faint questionable lucency on proximal left femur.  - 10/22/2021: SPEP : M spike 3.1. Increased kappa free light chains 139.7, with lambda light chains 10.7, and elevated ratio 13.06.  -10/29/2021: Skull x-rays: Multiple discrete round lucent/lytic lesions in the skull.  -11/18/2021: PET whole body: No evidence of  active myeloma. No suspicious lytic lesions. No plasmacytoma.  -11/19/2021: Bone Marrow Biopsy.  Pathology:  Plasma cell myeloma involving approximately 40% of the cellular marrow.             Myeloma FISH:  t(11;14)  Cytogenetics: Failed to grow metaphases  -12/14/2021 to current: DRd started with with lenalidomide  15 mg 3 weeks on/1 week off and dexamethasone  20 mg weekly  -09/26/2022 - 10/03/2022: Hospitalized with MSSA bacteremia, port removed, and cefepime given until 10/26/2022 -09/26/2023 - current: Revlimid  held due to worsening thrombocytopenia with history of GI bleeding.   Current Treatment: Daratumumab  1800mg  monthly   INTERVAL HISTORY:   Sherina  AIDEN RAO is a 73 y.o. female presenting to the clinic today for follow-up of multiple myeloma. Patient is unaccompanied today.   Dondrea  had GI hemorrhaging that was treated at [facility] with enteroscopy finding small bowel angiectasias s/p APC. She had had complete resolution of melena and blood in stools after surgery.   She denies any skin rashes or respiratory infections due to daratumumab . She is receiving Vitamin B  12 injections and supplements. 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   I have reviewed the past medical history, past surgical history, social history and family history with the patient and they are unchanged from previous note.  ALLERGIES:  is allergic to keflex  [cephalexin ].  MEDICATIONS:  Current Outpatient Medications  Medication Sig Dispense Refill   dexamethasone  (DECADRON ) 4 MG tablet Take 5 tablets (20 mg total) by mouth once a week. 20 tablet 3   acyclovir  (ZOVIRAX ) 400 MG tablet Take 1 tablet (400 mg total) by mouth 2 (two) times daily. 60 tablet 4   albuterol  (PROVENTIL ) (2.5 MG/3ML) 0.083% nebulizer solution Take 3 mLs (2.5 mg total) by nebulization every 4 (four) hours as needed for wheezing or shortness of breath. 75 mL 1   albuterol  (VENTOLIN  HFA) 108 (90 Base) MCG/ACT  inhaler Inhale 2 puffs into the lungs every 6 (six) hours as needed for wheezing or shortness of breath. 8 g 3   desvenlafaxine  (PRISTIQ ) 100 MG 24 hr tablet Take 100 mg by mouth in the morning.     ferrous sulfate  325 (65 FE) MG tablet Take 1 tablet (325 mg total) by mouth daily with breakfast. 30 tablet 3   fluticasone -salmeterol (ADVAIR) 100-50 MCG/ACT AEPB Inhale 1 puff into the lungs 2 (two) times daily. 60 each 5   folic acid  (FOLVITE ) 1 MG tablet Take 1 tablet (1 mg total) by mouth daily. 90 tablet 3   lactulose, encephalopathy, (CHRONULAC) 10 GM/15ML SOLN Take 30 mLs by mouth.     lidocaine -prilocaine  (EMLA ) cream Apply 1 Application topically as needed. Apply a small amount to port a cath site and cover with plastic wrap 1 hour prior to infusion appointments 30 g 3   lovastatin  (MEVACOR ) 20 MG tablet Take 1 tablet (20 mg total) by mouth at bedtime. 90 tablet 3   metolazone  (ZAROXOLYN ) 2.5 MG tablet Take 1 tablet (2.5 mg total) by mouth daily as needed. 30 tablet 0   nystatin  (MYCOSTATIN /NYSTOP ) powder Apply 1 Application topically 2 (two) times daily. 30 g 1   oxyCODONE -acetaminophen  (PERCOCET) 10-325 MG tablet Take 1 tablet by mouth 4 (four) times daily as needed for pain.     OXYGEN  Inhale 5 L into the lungs continuous. (Patient taking differently: Inhale 3 L into the lungs continuous.)     pantoprazole  (PROTONIX ) 40 MG tablet Take 1 tablet (40 mg total) by mouth 2 (two) times daily. 60 tablet 0   potassium chloride  SA (KLOR-CON  M) 20 MEQ tablet Take 1 tablet (20 mEq total) by mouth 2 (two) times daily. 60 tablet 3   Semaglutide ,0.25 or 0.5MG /DOS, (OZEMPIC , 0.25 OR 0.5 MG/DOSE,) 2 MG/3ML SOPN inject 0.25mg  into the skin every 7 days (Tuesdays)     spironolactone (ALDACTONE) 50 MG tablet Take 50 mg by mouth daily.     torsemide  (DEMADEX ) 20 MG tablet Take 40 mg by mouth.     No current facility-administered medications for this visit.     VITALS:  Weight 272 lb 1.6 oz (123.4 kg).   Wt Readings from Last 3 Encounters:  02/27/24 272 lb 1.6 oz (123.4 kg)  01/30/24 285 lb 4.4 oz (129.4 kg)  01/29/24 280 lb (127 kg)    Body mass index is 49.77 kg/m.  Performance status (ECOG): 1 - Symptomatic but completely ambulatory  PHYSICAL EXAM:   GENERAL:alert, no distress and comfortable, in a wheel chair SKIN: skin color, texture, turgor are normal, no rashes or significant lesions LUNGS: clear to auscultation and  percussion with normal breathing effort HEART: regular rate & rhythm and no murmurs and no lower extremity edema ABDOMEN:abdomen soft, non-tender and normal bowel sounds Musculoskeletal:no cyanosis of digits and no clubbing  NEURO: alert & oriented x 3 with fluent speech  LABORATORY DATA:  I have reviewed the data as listed     Component Value Date/Time   NA 142 02/27/2024 0826   NA 143 01/05/2024 1511   K 3.3 (L) 02/27/2024 0826   CL 99 02/27/2024 0826   CO2 29 02/27/2024 0826   GLUCOSE 128 (H) 02/27/2024 0826   BUN 12 02/27/2024 0826   BUN 20 01/05/2024 1511   CREATININE 0.61 02/27/2024 0826   CREATININE 0.71 07/29/2015 1110   CALCIUM  8.6 (L) 02/27/2024 0826   CALCIUM  8.5 (L) 02/09/2023 0859   PROT 5.6 (L) 02/27/2024 0826   PROT 5.6 (L) 01/05/2024 1511   ALBUMIN 3.8 02/27/2024 0826   ALBUMIN 3.9 01/05/2024 1511   AST 19 02/27/2024 0826   ALT 11 02/27/2024 0826   ALKPHOS 113 02/27/2024 0826   BILITOT 0.4 02/27/2024 0826   BILITOT 0.5 01/05/2024 1511   GFRNONAA >60 02/27/2024 0826   GFRNONAA 102 10/19/2022 0920   GFRNONAA >89 07/29/2015 1110   GFRAA 96 02/07/2020 1415   GFRAA >89 07/29/2015 1110     Lab Results  Component Value Date   WBC 3.9 (L) 02/27/2024   NEUTROABS 2.1 02/27/2024   HGB 11.2 (L) 02/27/2024   HCT 37.0 02/27/2024   MCV 97.1 02/27/2024   PLT 65 (L) 02/27/2024    Latest Reference Range & Units 02/13/24 08:31  Iron  28 - 170 ug/dL 856  UIBC ug/dL 764  TIBC 749 - 549 ug/dL 621  Saturation Ratios 10.4 - 31.8 % 38 (H)   Ferritin 11 - 307 ng/mL 907 (H)  (H): Data is abnormally high   Latest Reference Range & Units 02/13/24 08:24  Total Protein ELP 6.0 - 8.5 g/dL 5.0 (L) (C)  Albumin SerPl Elph-Mcnc 2.9 - 4.4 g/dL 2.9 (C)  Albumin/Glob SerPl 0.7 - 1.7  1.4 (C)  Alpha2 Glob SerPl Elph-Mcnc 0.4 - 1.0 g/dL 0.6 (C)  Alpha 1 0.0 - 0.4 g/dL 0.2 (C)  Gamma Glob SerPl Elph-Mcnc 0.4 - 1.8 g/dL 0.4 (C)  M Protein SerPl Elph-Mcnc Not Observed g/dL 0.2 (H) (C)  IFE 1  Comment ! (C)  Globulin, Total 2.2 - 3.9 g/dL 2.1 (L) (C)  B-Globulin SerPl Elph-Mcnc 0.7 - 1.3 g/dL 0.8 (C)  IgG (Immunoglobin G), Serum 586 - 1,602 mg/dL 518 (L)  IgM (Immunoglobulin M), Srm 26 - 217 mg/dL 12 (L)  IgA 64 - 577 mg/dL 52 (L)  (L): Data is abnormally low (H): Data is abnormally high !: Data is abnormal (C): Corrected   Latest Reference Range & Units 02/13/24 08:31  Kappa free light chain 3.3 - 19.4 mg/L 11.8  Lambda free light chains 5.7 - 26.3 mg/L 5.9  Kappa, lambda light chain ratio 0.26 - 1.65  2.00 (H)  (H): Data is abnormally high   RADIOGRAPHIC STUDIES: I have personally reviewed the radiological images as listed and agreed with the findings in the report.

## 2024-02-27 NOTE — Progress Notes (Signed)
 Per Dr Davonna - patient will no longer at this time need iron  infusions.  Niels Molt, PharmD Clinical Pharmacist

## 2024-02-27 NOTE — Progress Notes (Signed)
 Patient here today for her treatment. Patient did not take her prednisone  at home today. Will give it here at clinic today and a script will be sent home, patient stated she did not know where the medication was.   Per MD, also no more iron  infusions needed at this time. Verified today.    Treatment and B12 injection given per orders. Patient tolerated it well without problems. Vitals stable and discharged home from clinic via wheelchair.  Follow up as scheduled.

## 2024-02-27 NOTE — Patient Instructions (Signed)
 [facility] [place] - A DEPT OF [facility]  Discharge Instructions: Thank you for choosing [facility] to provide your oncology and hematology care.  If you have a lab appointment with the Cancer Center - please note that after April 8th, 2024, all labs will be drawn in the cancer center.  You do not have to check in or register with the main entrance as you have in the past but will complete your check-in in the cancer center.  Wear comfortable clothing and clothing appropriate for easy access to any Portacath or PICC line.   We strive to give you quality time with your provider. You may need to reschedule your appointment if you arrive late (15 or more minutes).  Arriving late affects you and other patients whose appointments are after yours.  Also, if you miss three or more appointments without notifying the office, you may be dismissed from the clinic at the provider's discretion.      For prescription refill requests, have your pharmacy contact our office and allow 72 hours for refills to be completed.    Today you received the following chemotherapy and/or immunotherapy agents B12 injection, Darzalex  Faspro injection   To help prevent nausea and vomiting after your treatment, we encourage you to take your nausea medication as directed.  BELOW ARE SYMPTOMS THAT SHOULD BE REPORTED IMMEDIATELY: *FEVER GREATER THAN 100.4 F (38 C) OR HIGHER *CHILLS OR SWEATING *NAUSEA AND VOMITING THAT IS NOT CONTROLLED WITH YOUR NAUSEA MEDICATION *UNUSUAL SHORTNESS OF BREATH *UNUSUAL BRUISING OR BLEEDING *URINARY PROBLEMS (pain or burning when urinating, or frequent urination) *BOWEL PROBLEMS (unusual diarrhea, constipation, pain near the anus) TENDERNESS IN MOUTH AND THROAT WITH OR WITHOUT PRESENCE OF ULCERS (sore throat, sores in mouth, or a toothache) UNUSUAL RASH, SWELLING OR PAIN  UNUSUAL VAGINAL DISCHARGE OR ITCHING   Items with * indicate a potential  emergency and should be followed up as soon as possible or go to the Emergency Department if any problems should occur.  Please show the CHEMOTHERAPY ALERT CARD or IMMUNOTHERAPY ALERT CARD at check-in to the Emergency Department and triage nurse.  Should you have questions after your visit or need to cancel or reschedule your appointment, please contact [facility] [place] - A DEPT OF [facility] [phone]  and follow the prompts.  Office hours are 8:00 a.m. to 4:30 p.m. Monday - Friday. Please note that voicemails left after 4:00 p.m. may not be returned until the following business day.  We are closed weekends and major holidays. You have access to a nurse at all times for urgent questions. Please call the main number to the clinic [phone] and follow the prompts.  For any non-urgent questions, you may also contact your provider using MyChart. We now offer e-Visits for anyone 4 and older to request care online for non-urgent symptoms. For details visit [email or web address].   Also download the MyChart app! Go to the app store, search MyChart, open the app, select [facility], and log in with your MyChart username and password.

## 2024-03-24 ENCOUNTER — Other Ambulatory Visit: Payer: Self-pay | Admitting: Family Medicine

## 2024-03-25 ENCOUNTER — Inpatient Hospital Stay: Admitting: Oncology

## 2024-03-25 ENCOUNTER — Inpatient Hospital Stay

## 2024-03-25 ENCOUNTER — Encounter: Payer: Self-pay | Admitting: *Deleted

## 2024-03-25 NOTE — Progress Notes (Deleted)
 " Patient Care Team: Jolinda Norene HERO, DO as PCP - General (Family Medicine) Alvan, Dorn FALCON, MD as PCP - Cardiology (Cardiology) Darlean Ned, MD (Rehabilitation) Shellia Oh, MD as Consulting Physician (Pulmonary Disease) Cindie Carlin POUR, DO as Consulting Physician (Internal Medicine) Edwena Parkin, NP as Nurse Practitioner (Nurse Practitioner) Gaile Frederick, MD as Referring Physician (Gastroenterology) Celestia Joesph SQUIBB, RN as Oncology Nurse Navigator (Medical Oncology) Eartha Flavors, Toribio, MD as Consulting Physician (Gastroenterology) Darlean Ozell NOVAK, MD as Consulting Physician (Pulmonary Disease)  Clinic Day:  03/25/2024  Referring physician: Jolinda Norene HERO, DO   CHIEF COMPLAINT:  CC: 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 female with multiple myeloma and iron  deficiency anemia.   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 Patient had worsening thrombocytopenia with a history of GI bleeding.  Held Revlimid  on 09/26/2023   - Patient is here for daratumumab  infusion today.  Tolerating well with no complaints.  Denies shortness of breath, upper respiratory tract infections. - Discussed with patient that we can start Revlimid  once her labs are more stable.  Can start at a lower dose of 5 mg daily 3 weeks on/1 week off probably if Hb is stable at next visit.  - Continue daratumumab  monthly at this time - Continue acyclovir  for prophylaxis - Reviewed labs today:CMP: Sodium: 142, potassium: 3.3, calcium : 8.6, normal creatinine, normal LFTs.  CBC: WBC: 3.9, hemoglobin improved to 11.2, platelets 65.  Recent myeloma labs from  02/13/2024: M spike: 0.2, free kappa light chain 11.8, free lambda light chain: 5.9, ratio: 2  Return to clinic in 1 month with labs  Iron  deficiency anemia due to chronic blood loss Patient has a history of iron  deficiency anemia likely secondary to GI AVMs S/p multiple IV iron  infusions -Push enteroscopy on 02/07/24 with gastritis s/p biopsy (negative for H pylori), gastric polyp, and small bowel angioectasias s/p APC, now with resolution of bleeding Recent iron  panel with TSAT of 38 and ferritin of 907   - No further IV iron  infusions at this time. - Will repeat iron  panel with next blood draw.  Vitamin B12 deficiency Patient had previous vitamin B12 deficiency and is currently on parenteral supplementation   - Continue parenteral vitamin B12 supplementation for now.  Continue oral supplementation - Will repeat it with next blood draw and discontinue if high.  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        The patient understands the plans discussed today and is in agreement with them.  She knows to contact our office if she develops concerns prior to her next appointment.  The total time spent in the appointment was 20 minutes for the encounter with patient, including review of chart and various tests results, discussions about plan of care and coordination of care plan   Mickiel Dry, MD  [facility] [place] - A DEPT OF [facility] [address] [place] [address] Dept: [phone] Dept Fax: [phone]   No orders of the defined types were placed in this encounter.    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 M-spike 2.6. Gamma Globulin 3.0. Total Globulin 5.1. IgG 3727. FLC ratio 4.17 with elevated kappa light chains at 75.5.  -08/03/2020: PET whole body: Diffuse mild  marrow activity in the appendicular skeleton is nonspecific. Query anemia marrow response. Consider bone marrow biopsy. No focal hypermetabolic activity in the skeleton. No soft tissue plasmacytoma.  No lytic lesion on CT portion exam. -08/04/2020: Bone marrow biopsy: Hypercellular bone marrow with plasma cell neoplasm. The plasma cells are increased in number representing 18% of all cells   - 07/16/2021: Skeletal survey: Faint lucencies noted throughout the skull with faint questionable lucency on proximal left femur.  - 10/22/2021: SPEP : M spike 3.1. Increased kappa free light chains 139.7, with lambda light chains 10.7, and elevated ratio 13.06.  -10/29/2021: Skull x-rays: Multiple discrete round lucent/lytic lesions in the skull.  -11/18/2021: PET whole body: No evidence of active myeloma. No suspicious lytic lesions. No plasmacytoma.  -11/19/2021: Bone Marrow Biopsy.  Pathology:  Plasma cell myeloma involving approximately 40% of the cellular marrow.             Myeloma FISH:  t(11;14)  Cytogenetics: Failed to grow metaphases  -12/14/2021 to current: DRd started with with lenalidomide  15 mg 3 weeks on/1 week off and dexamethasone  20 mg weekly  -09/26/2022 - 10/03/2022: Hospitalized with MSSA bacteremia, port removed, and cefepime given until 10/26/2022 -09/26/2023 - current: Revlimid  held due to worsening thrombocytopenia with history of GI bleeding.   Current Treatment: Daratumumab  1800mg  monthly   INTERVAL HISTORY:   Discussed the use of AI scribe software for clinical note transcription with the patient, who gave verbal consent to proceed.  History of Present Illness     I have reviewed the past medical history, past surgical history, social history and family history with the patient and they are unchanged from previous note.  ALLERGIES:  is allergic to keflex  [cephalexin ].  MEDICATIONS:  Current Outpatient Medications  Medication Sig Dispense Refill   acyclovir  (ZOVIRAX ) 400  MG tablet Take 1 tablet (400 mg total) by mouth 2 (two) times daily. 60 tablet 4   albuterol  (PROVENTIL ) (2.5 MG/3ML) 0.083% nebulizer solution Take 3 mLs (2.5 mg total) by nebulization every 4 (four) hours as needed for wheezing or shortness of breath. 75 mL 1   albuterol  (VENTOLIN  HFA) 108 (90 Base) MCG/ACT inhaler Inhale 2 puffs into the lungs every 6 (six) hours as needed for wheezing or shortness of breath. 8 g 3   desvenlafaxine  (PRISTIQ ) 100 MG 24 hr tablet Take 100 mg by mouth in the morning.     dexamethasone  (DECADRON ) 4 MG tablet Take 5 tablets (20 mg total) by mouth once a week. 20 tablet 3   ferrous sulfate  325 (65 FE) MG tablet Take 1 tablet (325 mg total) by mouth daily with breakfast. 30 tablet 3   fluticasone -salmeterol (ADVAIR) 100-50 MCG/ACT AEPB Inhale 1 puff into the lungs 2 (two) times daily. 60 each 5   folic acid  (FOLVITE ) 1 MG tablet Take 1 tablet (1 mg total) by mouth daily. 90 tablet 3   lactulose, encephalopathy, (CHRONULAC) 10 GM/15ML SOLN Take 30 mLs by mouth.     lidocaine -prilocaine  (EMLA ) cream Apply 1 Application topically as needed. Apply a small amount to port a cath site and cover with plastic wrap 1 hour prior to infusion appointments 30 g 3   lovastatin  (MEVACOR ) 20 MG tablet Take 1 tablet (20 mg  total) by mouth at bedtime. 90 tablet 3   metolazone  (ZAROXOLYN ) 2.5 MG tablet Take 1 tablet (2.5 mg total) by mouth daily as needed. 30 tablet 0   nystatin  (MYCOSTATIN /NYSTOP ) powder Apply 1 Application topically 2 (two) times daily. 30 g 1   oxyCODONE -acetaminophen  (PERCOCET) 10-325 MG tablet Take 1 tablet by mouth 4 (four) times daily as needed for pain.     OXYGEN  Inhale 5 L into the lungs continuous. (Patient taking differently: Inhale 3 L into the lungs continuous.)     pantoprazole  (PROTONIX ) 40 MG tablet Take 1 tablet (40 mg total) by mouth 2 (two) times daily. 60 tablet 0   potassium chloride  SA (KLOR-CON  M) 20 MEQ tablet Take 1 tablet (20 mEq total) by mouth 2  (two) times daily. 60 tablet 3   Semaglutide ,0.25 or 0.5MG /DOS, (OZEMPIC , 0.25 OR 0.5 MG/DOSE,) 2 MG/3ML SOPN inject 0.25mg  into the skin every 7 days (Tuesdays)     spironolactone (ALDACTONE) 50 MG tablet Take 50 mg by mouth daily.     torsemide  (DEMADEX ) 20 MG tablet Take 40 mg by mouth.     No current facility-administered medications for this visit.     VITALS:  There were no vitals taken for this visit.  Wt Readings from Last 3 Encounters:  02/27/24 272 lb 1.6 oz (123.4 kg)  01/30/24 285 lb 4.4 oz (129.4 kg)  01/29/24 280 lb (127 kg)    There is no height or weight on file to calculate BMI.  Performance status (ECOG): 1 - Symptomatic but completely ambulatory  PHYSICAL EXAM:   GENERAL:alert, no distress and comfortable, in a wheel chair SKIN: skin color, texture, turgor are normal, no rashes or significant lesions LUNGS: clear to auscultation and percussion with normal breathing effort HEART: regular rate & rhythm and no murmurs and no lower extremity edema ABDOMEN:abdomen soft, non-tender and normal bowel sounds Musculoskeletal:no cyanosis of digits and no clubbing  NEURO: alert & oriented x 3 with fluent speech  LABORATORY DATA:  I have reviewed the data as listed     Component Value Date/Time   NA 142 02/27/2024 0826   NA 143 01/05/2024 1511   K 3.3 (L) 02/27/2024 0826   CL 99 02/27/2024 0826   CO2 29 02/27/2024 0826   GLUCOSE 128 (H) 02/27/2024 0826   BUN 12 02/27/2024 0826   BUN 20 01/05/2024 1511   CREATININE 0.61 02/27/2024 0826   CREATININE 0.71 07/29/2015 1110   CALCIUM  8.6 (L) 02/27/2024 0826   CALCIUM  8.5 (L) 02/09/2023 0859   PROT 5.6 (L) 02/27/2024 0826   PROT 5.6 (L) 01/05/2024 1511   ALBUMIN 3.8 02/27/2024 0826   ALBUMIN 3.9 01/05/2024 1511   AST 19 02/27/2024 0826   ALT 11 02/27/2024 0826   ALKPHOS 113 02/27/2024 0826   BILITOT 0.4 02/27/2024 0826   BILITOT 0.5 01/05/2024 1511   GFRNONAA >60 02/27/2024 0826   GFRNONAA 102 10/19/2022 0920    GFRNONAA >89 07/29/2015 1110   GFRAA 96 02/07/2020 1415   GFRAA >89 07/29/2015 1110     Lab Results  Component Value Date   WBC 3.9 (L) 02/27/2024   NEUTROABS 2.1 02/27/2024   HGB 11.2 (L) 02/27/2024   HCT 37.0 02/27/2024   MCV 97.1 02/27/2024   PLT 65 (L) 02/27/2024    Latest Reference Range & Units 02/13/24 08:31  Iron  28 - 170 ug/dL 856  UIBC ug/dL 764  TIBC 749 - 549 ug/dL 621  Saturation Ratios 10.4 - 31.8 % 38 (H)  Ferritin 11 - 307 ng/mL 907 (H)  (H): Data is abnormally high   Latest Reference Range & Units 02/13/24 08:24  Total Protein ELP 6.0 - 8.5 g/dL 5.0 (L) (C)  Albumin SerPl Elph-Mcnc 2.9 - 4.4 g/dL 2.9 (C)  Albumin/Glob SerPl 0.7 - 1.7  1.4 (C)  Alpha2 Glob SerPl Elph-Mcnc 0.4 - 1.0 g/dL 0.6 (C)  Alpha 1 0.0 - 0.4 g/dL 0.2 (C)  Gamma Glob SerPl Elph-Mcnc 0.4 - 1.8 g/dL 0.4 (C)  M Protein SerPl Elph-Mcnc Not Observed g/dL 0.2 (H) (C)  IFE 1  Comment ! (C)  Globulin, Total 2.2 - 3.9 g/dL 2.1 (L) (C)  B-Globulin SerPl Elph-Mcnc 0.7 - 1.3 g/dL 0.8 (C)  IgG (Immunoglobin G), Serum 586 - 1,602 mg/dL 518 (L)  IgM (Immunoglobulin M), Srm 26 - 217 mg/dL 12 (L)  IgA 64 - 577 mg/dL 52 (L)  (L): Data is abnormally low (H): Data is abnormally high !: Data is abnormal (C): Corrected   Latest Reference Range & Units 02/13/24 08:31  Kappa free light chain 3.3 - 19.4 mg/L 11.8  Lambda free light chains 5.7 - 26.3 mg/L 5.9  Kappa, lambda light chain ratio 0.26 - 1.65  2.00 (H)  (H): Data is abnormally high   RADIOGRAPHIC STUDIES: I have personally reviewed the radiological images as listed and agreed with the findings in the report.  "

## 2024-03-26 ENCOUNTER — Inpatient Hospital Stay

## 2024-04-03 ENCOUNTER — Ambulatory Visit: Attending: Cardiology | Admitting: Cardiology

## 2024-04-03 NOTE — Progress Notes (Deleted)
 "     Clinical Summary Savannah Burns is a 74 y.o.female  1. SOB/hypoxia - ongoing workup by pcp. Suspicion of possible COPD, OHS playing a role. - awaitin pulmonary evaluation       2. Chronic diastolic HF - 12/2014 echo LVEF 60-65%, grade II diastolic dysfunction - 11/2023 echo: LVEF 60-65%< grade I dd, normal RV function, mild to mod AS mean grad 25. AVA VTI likely inaccurate based on inaccurate LVOT measure. DI 0.48 would support moderate AS   - she is on lasix  20mg  bid   - long history of LE edema that has worst - has been on lasix  for several years - DOE at just a few steps, new over the last 8 months - 12/2017 weight 284. 303 lbs today.  - sleeps in recliner 1 year, more so for back pains.  - no chest pain.     3. Aortic stenosis - - 11/2023 echo: LVEF 60-65%< grade I dd, normal RV function, mild to mod AS mean grad 25. AVA VTI likely inaccurate based on inaccurate LVOT measure. DI 0.48 would support moderate AS  4.HTN   5. HLD  6.Cirrhosis with esophageal varices  7. COPD on home O2  8. GI bleed - admit [facility] 01/2024 with melena, Hgb 6.  - Previously pt was admitted from 5/5 to 08/04/2021 due to GI bleed, found to have small bowel angioectasia treated with APC, EGD also showed grade I esophageal varices.    Past Medical History:  Diagnosis Date   Adrenal adenoma, left    Stable   Anxiety    Arthritis    bilateral hands   COPD (chronic obstructive pulmonary disease) (HCC)    Depression    Diabetes mellitus, type 2 (HCC) 08/12/2008   Qualifier: Diagnosis of  By: Jenetta MD, Talia     Dyspnea    Esophageal varices (HCC)    Grade II diastolic dysfunction    History of kidney stones    Hyperlipidemia    Hypertension    Lower back pain    Lower GI bleed 03/19/2020   Panic attacks    Pneumonia    currently taking antibiotic and prednisone  for early stages of pneumonia   Pulmonary nodules    bilateral   Skin cancer    face     Allergies[1]   Current  Outpatient Medications  Medication Sig Dispense Refill   acyclovir  (ZOVIRAX ) 400 MG tablet Take 1 tablet (400 mg total) by mouth 2 (two) times daily. 60 tablet 4   albuterol  (PROVENTIL ) (2.5 MG/3ML) 0.083% nebulizer solution Take 3 mLs (2.5 mg total) by nebulization every 4 (four) hours as needed for wheezing or shortness of breath. 75 mL 1   albuterol  (VENTOLIN  HFA) 108 (90 Base) MCG/ACT inhaler Inhale 2 puffs into the lungs every 6 (six) hours as needed for wheezing or shortness of breath. 8 g 3   desvenlafaxine  (PRISTIQ ) 100 MG 24 hr tablet Take 100 mg by mouth in the morning.     dexamethasone  (DECADRON ) 4 MG tablet Take 5 tablets (20 mg total) by mouth once a week. 20 tablet 3   ferrous sulfate  325 (65 FE) MG tablet Take 1 tablet (325 mg total) by mouth daily with breakfast. 30 tablet 3   fluticasone -salmeterol (ADVAIR) 100-50 MCG/ACT AEPB Inhale 1 puff into the lungs 2 (two) times daily. 60 each 5   folic acid  (FOLVITE ) 1 MG tablet Take 1 tablet (1 mg total) by mouth daily. 90 tablet 3  lactulose, encephalopathy, (CHRONULAC) 10 GM/15ML SOLN Take 30 mLs by mouth.     lidocaine -prilocaine  (EMLA ) cream Apply 1 Application topically as needed. Apply a small amount to port a cath site and cover with plastic wrap 1 hour prior to infusion appointments 30 g 3   lovastatin  (MEVACOR ) 20 MG tablet Take 1 tablet (20 mg total) by mouth at bedtime. 90 tablet 3   metolazone  (ZAROXOLYN ) 2.5 MG tablet Take 1 tablet (2.5 mg total) by mouth daily as needed. 30 tablet 0   nystatin  (MYCOSTATIN /NYSTOP ) powder Apply 1 Application topically 2 (two) times daily. 30 g 1   oxyCODONE -acetaminophen  (PERCOCET) 10-325 MG tablet Take 1 tablet by mouth 4 (four) times daily as needed for pain.     OXYGEN  Inhale 5 L into the lungs continuous. (Patient taking differently: Inhale 3 L into the lungs continuous.)     OZEMPIC , 0.25 OR 0.5 MG/DOSE, 2 MG/3ML SOPN INJECT 0.25MG  INTO THE SKIN EVERY 7 DAYS 9 mL 3   pantoprazole   (PROTONIX ) 40 MG tablet Take 1 tablet (40 mg total) by mouth 2 (two) times daily. 60 tablet 0   potassium chloride  SA (KLOR-CON  M) 20 MEQ tablet Take 1 tablet (20 mEq total) by mouth 2 (two) times daily. 60 tablet 3   spironolactone (ALDACTONE) 50 MG tablet Take 50 mg by mouth daily.     torsemide  (DEMADEX ) 20 MG tablet Take 40 mg by mouth.     No current facility-administered medications for this visit.     Past Surgical History:  Procedure Laterality Date   BIOPSY  04/07/2020   Procedure: BIOPSY;  Surgeon: Cindie Carlin POUR, DO;  Location: [facility];  Service: Endoscopy;;   Breast Cystectomy  Right    CESAREAN SECTION     COLONOSCOPY WITH PROPOFOL  N/A 01/25/2020   Dr. Cindie: Nonbleeding internal hemorrhoids, diverticulosis, 5 mm polyp removed from the ascending colon, 10 mm polyp removed from the sigmoid colon, 30 mm polyp (tubulovillous adenoma with no high-grade dysplasia) removed from the transverse colon via piecemeal status post tattoo.  Other polyps were tubular adenomas.  3 month surveillance colonoscopy recommended.   COLONOSCOPY WITH PROPOFOL  N/A 04/07/2020   Procedure: COLONOSCOPY WITH PROPOFOL ;  Surgeon: Cindie Carlin POUR, DO;  Location: [facility];  Service: Endoscopy;  Laterality: N/A;  3:00pm, pt knows new time per office   CYSTOSCOPY/URETEROSCOPY/HOLMIUM LASER/STENT PLACEMENT Bilateral 03/01/2019   Procedure: CYSTOSCOPY/RETROGRADEURETEROSCOPY/HOLMIUM LASER/STENT PLACEMENT;  Surgeon: Devere Lonni Righter, MD;  Location: [facility];  Service: Urology;  Laterality: Bilateral;  ONLY NEEDS 60 MIN   ESOPHAGOGASTRODUODENOSCOPY (EGD) WITH PROPOFOL  N/A 01/25/2020   Dr. Cindie: 4 columns grade 1 esophageal varices   ESOPHAGOGASTRODUODENOSCOPY (EGD) WITH PROPOFOL  N/A 05/18/2020   Procedure: ESOPHAGOGASTRODUODENOSCOPY (EGD) WITH PROPOFOL ;  Surgeon: Cindie Carlin POUR, DO;  [facility]: [facility];  Service: Endoscopy;  Laterality: N/A;   ESOPHAGOGASTRODUODENOSCOPY (EGD) WITH  PROPOFOL  N/A 07/28/2020   Procedure: ESOPHAGOGASTRODUODENOSCOPY (EGD) WITH PROPOFOL ;  Surgeon: Cindie Carlin POUR, DO;  [facility]: [facility];  Service: Endoscopy;  Laterality: N/A;  am or early PM due to givens capsule placement   GIVENS CAPSULE STUDY N/A 05/18/2020   Procedure: GIVENS CAPSULE STUDY;  Surgeon: Eartha Angelia Sieving, MD;  Location: [facility];  Service: Gastroenterology;  Laterality: N/A;   GIVENS CAPSULE STUDY N/A 07/28/2020   Procedure: GIVENS CAPSULE STUDY;  Surgeon: Cindie Carlin POUR, DO;  [facility]: [facility];  Service: Endoscopy;  Laterality: N/A;   IR IMAGING GUIDED PORT INSERTION  12/02/2021  IRRIGATION AND DEBRIDEMENT ABSCESS Right 09/28/2022   Procedure: IRRIGATION AND DEBRIDEMENT ABSCESS;  Surgeon: Evonnie Dorothyann LABOR, DO;  Location: [facility];  Service: General;  Laterality: Right;   POLYPECTOMY  01/25/2020   Procedure: POLYPECTOMY;  Surgeon: Cindie Carlin POUR, DO;  [facility];  Service: Endoscopy;;   POLYPECTOMY  04/07/2020   Procedure: POLYPECTOMY INTESTINAL;  Surgeon: Cindie Carlin POUR, DO;  [facility];  Service: Endoscopy;;   PORT-A-CATH REMOVAL Right 09/28/2022   Procedure: MINOR REMOVAL PORT-A-CATH;  Surgeon: Evonnie Dorothyann LABOR, DO;  Location: [facility];  Service: General;  Laterality: Right;   PORTACATH PLACEMENT Right 04/12/2023   Procedure: INSERTION PORT-A-CATH, RIJ;  Surgeon: Evonnie Dorothyann LABOR, DO;  Location: [facility];  Service: General;  Laterality: Right;   SKIN CANCER EXCISION     Face   SPINE SURGERY     SUBMUCOSAL TATTOO INJECTION  01/25/2020   Procedure: SUBMUCOSAL TATTOO INJECTION;  Surgeon: Cindie Carlin POUR, DO;  [facility];  Service: Endoscopy;;     Allergies[2]    Family History  Problem Relation Age of Onset   Diabetes Father    Heart disease Father 35       MI   Hypertension Father    Anemia Mother        Transfusion dependent   COPD Sister    Cancer Paternal Grandmother 46        Pancreatic     Social History Savannah Burns reports that she quit smoking about 8 years ago. Her smoking use included cigarettes. She started smoking about 48 years ago. She has a 60 pack-year smoking history. She has never used smokeless tobacco. Savannah Burns reports no history of alcohol  use.   Review of Systems CONSTITUTIONAL: No weight loss, fever, chills, weakness or fatigue.  HEENT: Eyes: No visual loss, blurred vision, double vision or yellow sclerae.No hearing loss, sneezing, congestion, runny nose or sore throat.  SKIN: No rash or itching.  CARDIOVASCULAR:  RESPIRATORY: No shortness of breath, cough or sputum.  GASTROINTESTINAL: No anorexia, nausea, vomiting or diarrhea. No abdominal pain or blood.  GENITOURINARY: No burning on urination, no polyuria NEUROLOGICAL: No headache, dizziness, syncope, paralysis, ataxia, numbness or tingling in the extremities. No change in bowel or bladder control.  MUSCULOSKELETAL: No muscle, back pain, joint pain or stiffness.  LYMPHATICS: No enlarged nodes. No history of splenectomy.  PSYCHIATRIC: No history of depression or anxiety.  ENDOCRINOLOGIC: No reports of sweating, cold or heat intolerance. No polyuria or polydipsia.  SABRA   Physical Examination There were no vitals filed for this visit. There were no vitals filed for this visit.  Gen: resting comfortably, no acute distress HEENT: no scleral icterus, pupils equal round and reactive, no palptable cervical adenopathy,  CV Resp: Clear to auscultation bilaterally GI: abdomen is soft, non-tender, non-distended, normal bowel sounds, no hepatosplenomegaly MSK: extremities are warm, no edema.  Skin: warm, no rash Neuro:  no focal deficits Psych: appropriate affect   Diagnostic Studies  11/2023 echo 1. Left ventricular ejection fraction, by estimation, is 60 to 65%. The  left ventricle has normal function. The left ventricle has no regional  wall motion abnormalities. There is mild left  ventricular hypertrophy.  Left ventricular diastolic parameters  are consistent with Grade I diastolic dysfunction (impaired relaxation).  Elevated left ventricular end-diastolic pressure.   2. Right ventricular systolic function is normal. The right ventricular  size is normal. There is normal pulmonary artery systolic pressure.   3. Left  atrial size was mild to moderately dilated.   4. Right atrial size was moderately dilated.   5. The mitral valve is abnormal. No evidence of mitral valve  regurgitation. No evidence of mitral stenosis. Moderate to severe mitral  annular calcification.   6. The aortic valve is tricuspid. There is moderate calcification of the  aortic valve. There is moderate thickening of the aortic valve. Aortic  valve regurgitation is not visualized. Mild to moderate aortic stenosis is  present. Aortic valve area, by VTI   measures 2.63 cm likely due to overestimated LVOT diameter but aortic  valve area by planimetry was 1.89 cm2 although this image was suboptimal.  Based on restricted aortic valve leaflet excursion, aortic valve stenosis  is mild to moderate. Aortic valve  mean gradient measures 24.7 mmHg. Aortic valve Vmax measures 3.35 m/s.   7. Aortic dilatation noted. There is mild dilatation of the ascending  aorta, measuring 41 mm.   8. The inferior vena cava is dilated in size with >50% respiratory  variability, suggesting right atrial pressure of 8 mmHg.    Assessment and Plan   1. Acute on chronic diastolic HF - volume overloaded, certaintly playing some role in her symptoms of SOB, she is awaiting pulmonary evaluation to consider additional causes - increase lasix  to 40mg  bid. In 2 weeks check bmet/mg/tsh/bnp - repeat echo given worsening fluid retention and SOB since her last study in 2016   2. Heart murmur - obtain echo   3. HTN - bp elevated today, was at goal with visit last month - follow with diuresis     Dorn PHEBE Ross, M.D.,  F.A.C.C.    [1]  Allergies Allergen Reactions   Keflex  [Cephalexin ] Nausea And Vomiting  [2]  Allergies Allergen Reactions   Keflex  [Cephalexin ] Nausea And Vomiting   "

## 2024-04-23 ENCOUNTER — Inpatient Hospital Stay

## 2024-04-23 ENCOUNTER — Inpatient Hospital Stay: Admitting: Oncology

## 2024-04-25 ENCOUNTER — Inpatient Hospital Stay: Admitting: Oncology

## 2024-04-25 ENCOUNTER — Inpatient Hospital Stay: Attending: Hematology

## 2024-04-25 ENCOUNTER — Inpatient Hospital Stay

## 2024-05-02 ENCOUNTER — Inpatient Hospital Stay

## 2024-05-02 ENCOUNTER — Inpatient Hospital Stay: Admitting: Oncology

## 2024-05-02 DIAGNOSIS — C9000 Multiple myeloma not having achieved remission: Secondary | ICD-10-CM

## 2024-05-08 ENCOUNTER — Inpatient Hospital Stay

## 2024-05-08 ENCOUNTER — Inpatient Hospital Stay: Admitting: Oncology

## 2024-05-08 ENCOUNTER — Inpatient Hospital Stay: Attending: Hematology

## 2024-05-21 ENCOUNTER — Inpatient Hospital Stay

## 2024-05-21 ENCOUNTER — Inpatient Hospital Stay: Attending: Hematology

## 2024-05-30 ENCOUNTER — Inpatient Hospital Stay

## 2024-06-05 ENCOUNTER — Inpatient Hospital Stay: Attending: Hematology

## 2024-06-05 ENCOUNTER — Inpatient Hospital Stay

## 2025-01-30 ENCOUNTER — Ambulatory Visit
